# Patient Record
Sex: Female | Born: 1935 | Race: White | Hispanic: No | Marital: Single | Smoking: Former smoker
Health system: Southern US, Community
[De-identification: ages and names within clinical notes are randomized; demographics above are authoritative.]

## PROBLEM LIST (undated history)

## (undated) DIAGNOSIS — Z955 Presence of coronary angioplasty implant and graft: Secondary | ICD-10-CM

## (undated) DIAGNOSIS — I2510 Atherosclerotic heart disease of native coronary artery without angina pectoris: Secondary | ICD-10-CM

## (undated) DIAGNOSIS — Z794 Long term (current) use of insulin: Secondary | ICD-10-CM

## (undated) DIAGNOSIS — I219 Acute myocardial infarction, unspecified: Secondary | ICD-10-CM

## (undated) DIAGNOSIS — E119 Type 2 diabetes mellitus without complications: Secondary | ICD-10-CM

## (undated) DIAGNOSIS — I319 Disease of pericardium, unspecified: Secondary | ICD-10-CM

## (undated) DIAGNOSIS — E785 Hyperlipidemia, unspecified: Secondary | ICD-10-CM

## (undated) DIAGNOSIS — J449 Chronic obstructive pulmonary disease, unspecified: Secondary | ICD-10-CM

## (undated) DIAGNOSIS — R0602 Shortness of breath: Secondary | ICD-10-CM

## (undated) DIAGNOSIS — M1990 Unspecified osteoarthritis, unspecified site: Secondary | ICD-10-CM

## (undated) DIAGNOSIS — I209 Angina pectoris, unspecified: Secondary | ICD-10-CM

## (undated) DIAGNOSIS — I440 Atrioventricular block, first degree: Secondary | ICD-10-CM

## (undated) DIAGNOSIS — I10 Essential (primary) hypertension: Secondary | ICD-10-CM

## (undated) HISTORY — DX: Atrioventricular block, first degree: I44.0

## (undated) HISTORY — DX: Chronic obstructive pulmonary disease, unspecified: J44.9

## (undated) HISTORY — DX: Disease of pericardium, unspecified: I31.9

## (undated) HISTORY — DX: Atherosclerotic heart disease of native coronary artery without angina pectoris: I25.10

## (undated) HISTORY — PX: CHOLECYSTECTOMY: SHX55

---

## 1988-07-18 HISTORY — PX: PTCA: SHX146

## 1999-05-20 ENCOUNTER — Other Ambulatory Visit: Admission: RE | Admit: 1999-05-20 | Discharge: 1999-05-20 | Payer: Self-pay | Admitting: Internal Medicine

## 2002-07-15 ENCOUNTER — Encounter: Payer: Self-pay | Admitting: Internal Medicine

## 2002-07-15 ENCOUNTER — Inpatient Hospital Stay (HOSPITAL_COMMUNITY): Admission: RE | Admit: 2002-07-15 | Discharge: 2002-07-19 | Payer: Self-pay | Admitting: Internal Medicine

## 2002-07-17 ENCOUNTER — Encounter: Payer: Self-pay | Admitting: Internal Medicine

## 2002-08-30 ENCOUNTER — Encounter: Admission: RE | Admit: 2002-08-30 | Discharge: 2002-08-30 | Payer: Self-pay | Admitting: Thoracic Surgery

## 2002-08-30 ENCOUNTER — Encounter: Payer: Self-pay | Admitting: Thoracic Surgery

## 2002-09-26 ENCOUNTER — Encounter: Admission: RE | Admit: 2002-09-26 | Discharge: 2002-09-26 | Payer: Self-pay | Admitting: Thoracic Surgery

## 2002-09-26 ENCOUNTER — Encounter: Payer: Self-pay | Admitting: Thoracic Surgery

## 2002-10-21 ENCOUNTER — Encounter: Payer: Self-pay | Admitting: Internal Medicine

## 2002-10-21 ENCOUNTER — Encounter: Admission: RE | Admit: 2002-10-21 | Discharge: 2002-10-21 | Payer: Self-pay | Admitting: Internal Medicine

## 2002-11-26 ENCOUNTER — Encounter: Payer: Self-pay | Admitting: Internal Medicine

## 2003-05-30 ENCOUNTER — Encounter: Admission: RE | Admit: 2003-05-30 | Discharge: 2003-05-30 | Payer: Self-pay | Admitting: Internal Medicine

## 2004-05-26 ENCOUNTER — Ambulatory Visit: Payer: Self-pay | Admitting: Internal Medicine

## 2004-06-03 ENCOUNTER — Ambulatory Visit: Payer: Self-pay | Admitting: Internal Medicine

## 2004-06-07 ENCOUNTER — Ambulatory Visit: Payer: Self-pay | Admitting: Internal Medicine

## 2004-06-24 ENCOUNTER — Ambulatory Visit: Payer: Self-pay | Admitting: Internal Medicine

## 2004-07-26 ENCOUNTER — Ambulatory Visit: Payer: Self-pay | Admitting: Internal Medicine

## 2004-10-25 ENCOUNTER — Ambulatory Visit: Payer: Self-pay | Admitting: Internal Medicine

## 2004-11-02 ENCOUNTER — Ambulatory Visit: Payer: Self-pay | Admitting: Internal Medicine

## 2004-11-10 ENCOUNTER — Ambulatory Visit: Payer: Self-pay | Admitting: Internal Medicine

## 2004-11-16 ENCOUNTER — Ambulatory Visit: Payer: Self-pay | Admitting: Internal Medicine

## 2004-12-20 ENCOUNTER — Ambulatory Visit: Payer: Self-pay | Admitting: Internal Medicine

## 2005-03-07 ENCOUNTER — Ambulatory Visit: Payer: Self-pay | Admitting: Internal Medicine

## 2005-06-06 ENCOUNTER — Ambulatory Visit: Payer: Self-pay | Admitting: Internal Medicine

## 2005-08-03 ENCOUNTER — Ambulatory Visit: Payer: Self-pay | Admitting: Internal Medicine

## 2005-09-28 ENCOUNTER — Ambulatory Visit: Payer: Self-pay | Admitting: Internal Medicine

## 2005-11-30 ENCOUNTER — Ambulatory Visit: Payer: Self-pay | Admitting: Internal Medicine

## 2006-01-16 ENCOUNTER — Ambulatory Visit: Payer: Self-pay | Admitting: Internal Medicine

## 2006-01-22 ENCOUNTER — Emergency Department (HOSPITAL_COMMUNITY): Admission: AD | Admit: 2006-01-22 | Discharge: 2006-01-22 | Payer: Self-pay | Admitting: Emergency Medicine

## 2006-01-25 ENCOUNTER — Ambulatory Visit: Payer: Self-pay | Admitting: Internal Medicine

## 2006-02-03 ENCOUNTER — Ambulatory Visit: Payer: Self-pay | Admitting: Internal Medicine

## 2006-02-25 ENCOUNTER — Emergency Department (HOSPITAL_COMMUNITY): Admission: EM | Admit: 2006-02-25 | Discharge: 2006-02-25 | Payer: Self-pay | Admitting: Emergency Medicine

## 2006-03-07 ENCOUNTER — Ambulatory Visit: Payer: Self-pay | Admitting: Internal Medicine

## 2006-04-21 ENCOUNTER — Ambulatory Visit: Payer: Self-pay | Admitting: Internal Medicine

## 2006-04-21 DIAGNOSIS — J449 Chronic obstructive pulmonary disease, unspecified: Secondary | ICD-10-CM | POA: Insufficient documentation

## 2006-04-21 DIAGNOSIS — I10 Essential (primary) hypertension: Secondary | ICD-10-CM | POA: Insufficient documentation

## 2006-04-21 DIAGNOSIS — I2510 Atherosclerotic heart disease of native coronary artery without angina pectoris: Secondary | ICD-10-CM | POA: Insufficient documentation

## 2006-04-21 DIAGNOSIS — E1159 Type 2 diabetes mellitus with other circulatory complications: Secondary | ICD-10-CM | POA: Insufficient documentation

## 2006-07-28 ENCOUNTER — Ambulatory Visit: Payer: Self-pay | Admitting: Internal Medicine

## 2006-07-28 LAB — CONVERTED CEMR LAB
ALT: 24 units/L (ref 0–40)
AST: 19 units/L (ref 0–37)
Albumin: 3.7 g/dL (ref 3.5–5.2)
Alkaline Phosphatase: 76 units/L (ref 39–117)
BUN: 19 mg/dL (ref 6–23)
Bilirubin, Direct: 0.1 mg/dL (ref 0.0–0.3)
CO2: 32 meq/L (ref 19–32)
Calcium: 10.1 mg/dL (ref 8.4–10.5)
Chloride: 101 meq/L (ref 96–112)
Chol/HDL Ratio, serum: 9.2
Cholesterol: 238 mg/dL (ref 0–200)
Creatinine, Ser: 0.7 mg/dL (ref 0.4–1.2)
GFR calc non Af Amer: 88 mL/min
Glomerular Filtration Rate, Af Am: 106 mL/min/{1.73_m2}
Glucose, Bld: 124 mg/dL — ABNORMAL HIGH (ref 70–99)
HDL: 26 mg/dL — ABNORMAL LOW (ref >39.0)
Hgb A1c MFr Bld: 6.8 % — ABNORMAL HIGH (ref 4.6–6.0)
LDL DIRECT: 18.6 mg/dL
Potassium: 4 meq/L (ref 3.5–5.1)
Sodium: 139 meq/L (ref 135–145)
Total Bilirubin: 0.8 mg/dL (ref 0.3–1.2)
Total Protein: 6.6 g/dL (ref 6.0–8.3)
Triglyceride fasting, serum: 1693 mg/dL (ref 0–149)

## 2006-08-08 ENCOUNTER — Ambulatory Visit: Payer: Self-pay | Admitting: Internal Medicine

## 2006-08-22 ENCOUNTER — Ambulatory Visit: Payer: Self-pay | Admitting: Internal Medicine

## 2006-08-22 LAB — CONVERTED CEMR LAB
ALT: 20 units/L (ref 0–40)
AST: 20 units/L (ref 0–37)
Albumin: 4.1 g/dL (ref 3.5–5.2)
Alkaline Phosphatase: 57 units/L (ref 39–117)
BUN: 21 mg/dL (ref 6–23)
Bilirubin, Direct: 0.1 mg/dL (ref 0.0–0.3)
CO2: 27 meq/L (ref 19–32)
Calcium: 10.3 mg/dL (ref 8.4–10.5)
Chloride: 106 meq/L (ref 96–112)
Cholesterol: 201 mg/dL (ref 0–200)
Creatinine, Ser: 0.9 mg/dL (ref 0.4–1.2)
Direct LDL: 69 mg/dL
GFR calc Af Amer: 79 mL/min
GFR calc non Af Amer: 66 mL/min
Glucose, Bld: 148 mg/dL — ABNORMAL HIGH (ref 70–99)
HDL: 33.5 mg/dL — ABNORMAL LOW (ref >39.0)
Potassium: 4.7 meq/L (ref 3.5–5.1)
Sodium: 142 meq/L (ref 135–145)
Total Bilirubin: 0.5 mg/dL (ref 0.3–1.2)
Total CHOL/HDL Ratio: 6
Total Protein: 7.5 g/dL (ref 6.0–8.3)
Triglycerides: 708 mg/dL (ref 0–149)
VLDL: 142 mg/dL — ABNORMAL HIGH (ref 0–40)

## 2006-08-29 ENCOUNTER — Ambulatory Visit: Payer: Self-pay | Admitting: Internal Medicine

## 2006-09-19 ENCOUNTER — Ambulatory Visit: Payer: Self-pay | Admitting: Internal Medicine

## 2006-09-26 ENCOUNTER — Encounter: Admission: RE | Admit: 2006-09-26 | Discharge: 2006-09-26 | Payer: Self-pay | Admitting: Internal Medicine

## 2006-09-26 IMAGING — CR DG CHEST 2V
2 series · 2 of 2 positions shown · non-contrast
Comparison: [DATE]

CLINICAL DATA: Cough, wheeze

CHEST - 2 VIEW:

[view not recorded (1 of 2)]
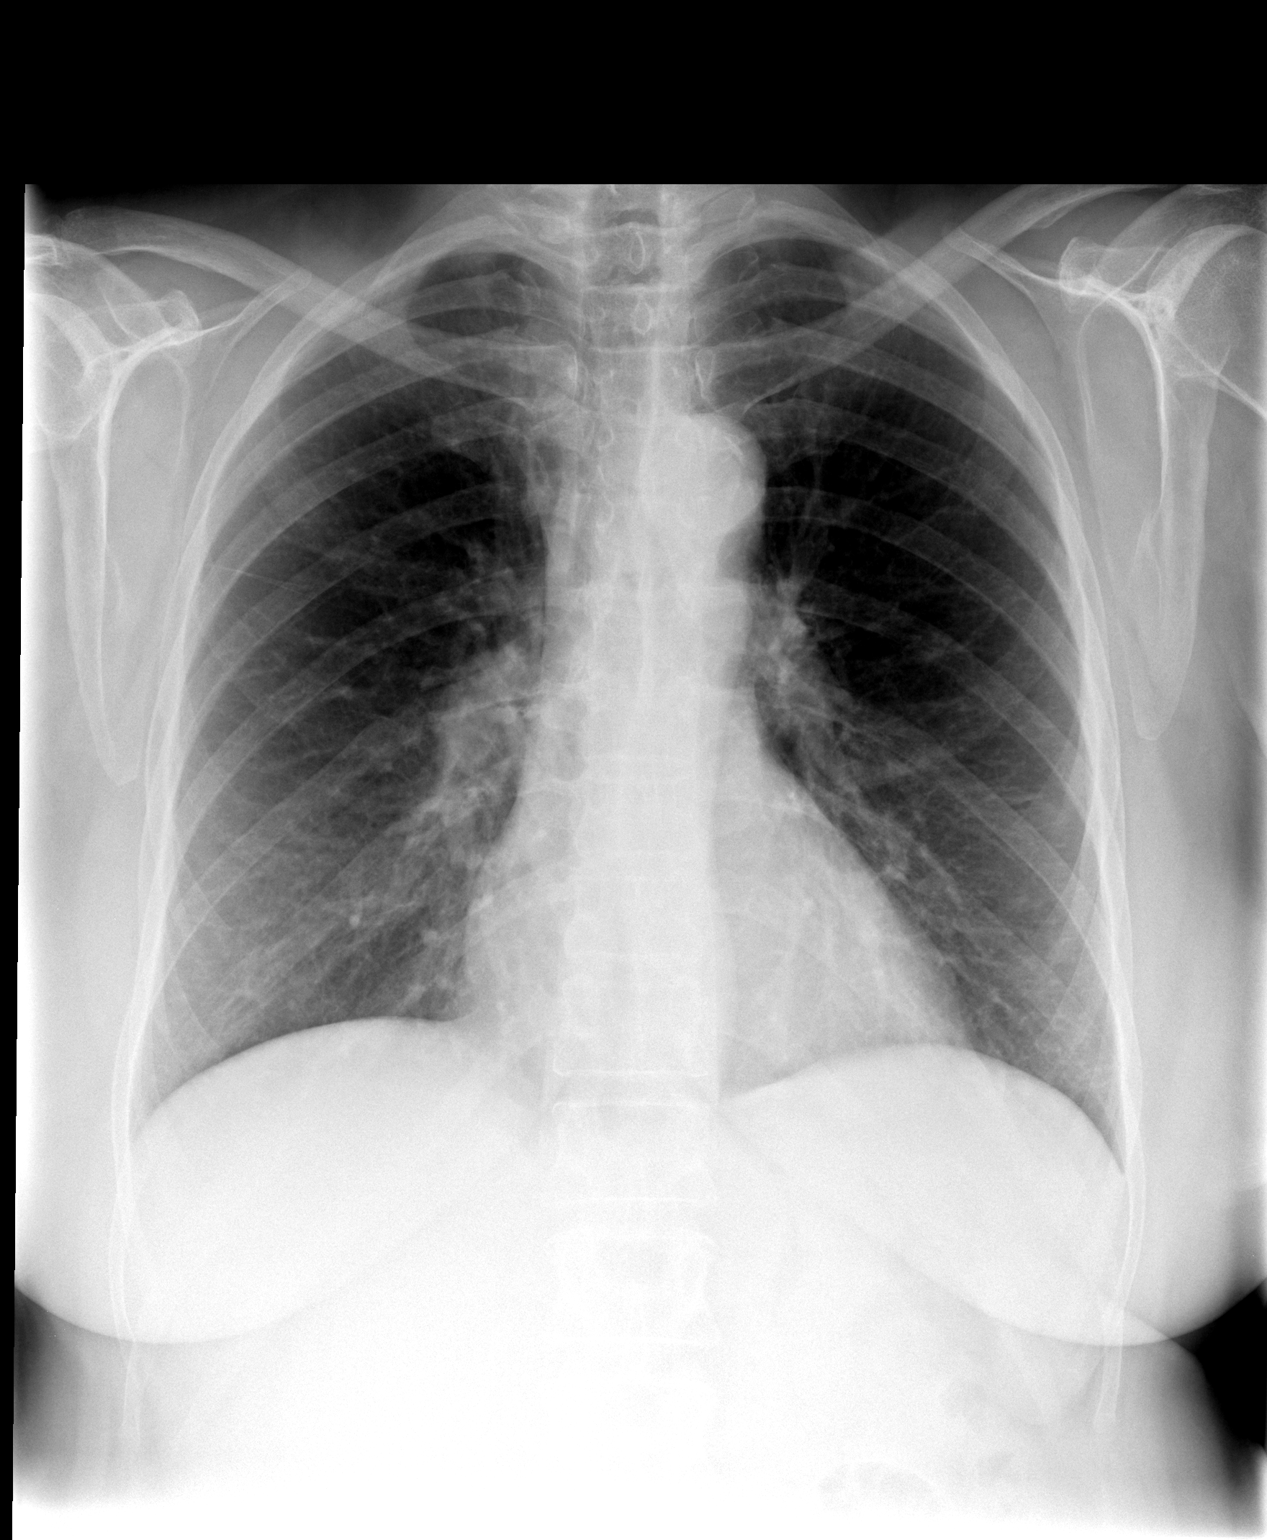

[view not recorded (2 of 2)]
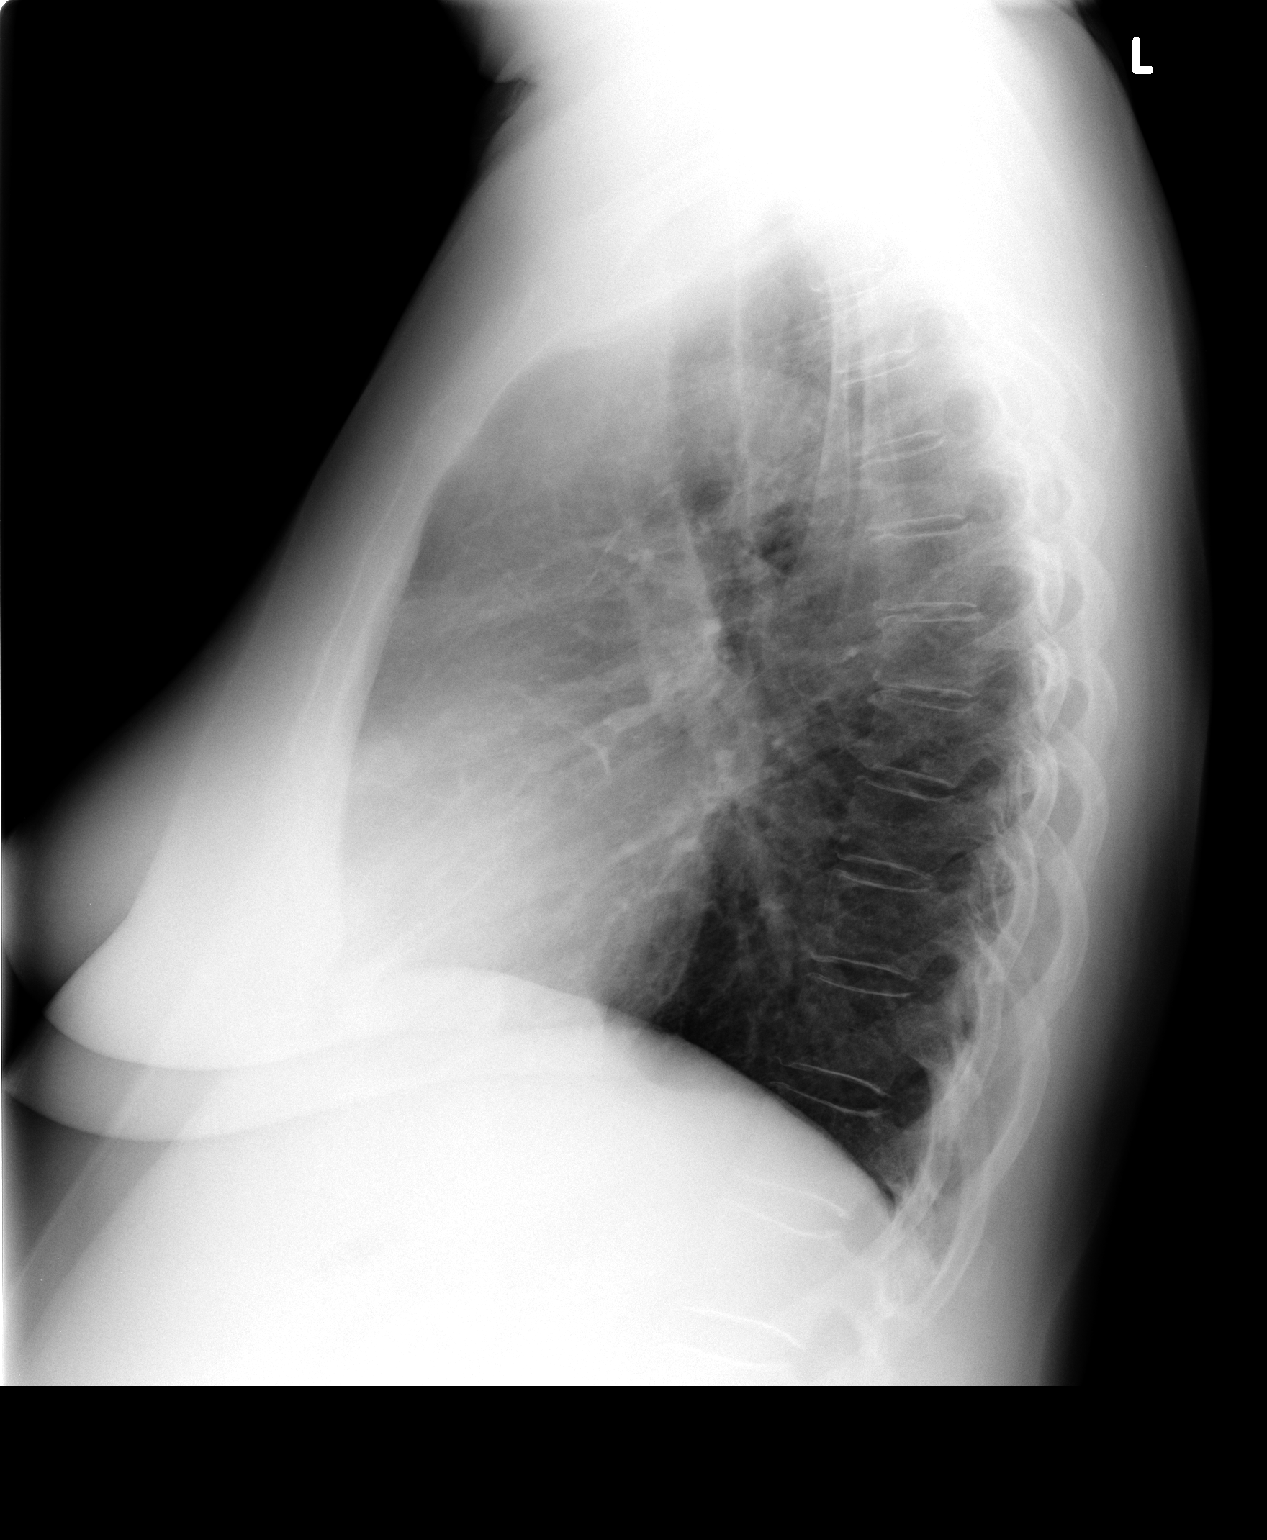

[2 of 2 positions shown; findings below may reference images not displayed]

FINDINGS: The heart size and mediastinal contours are within normal limits. 
Both lungs are clear.  The visualized skeletal structures are unremarkable. Mild
peribronchial thickening.
IMPRESSION: No active cardiopulmonary disease

Mild chronic bronchitic changes.

## 2006-10-25 ENCOUNTER — Ambulatory Visit: Payer: Self-pay | Admitting: Internal Medicine

## 2006-12-05 ENCOUNTER — Ambulatory Visit: Payer: Self-pay | Admitting: Internal Medicine

## 2006-12-05 LAB — CONVERTED CEMR LAB
ALT: 27 units/L (ref 0–40)
AST: 28 units/L (ref 0–37)
Albumin: 3.9 g/dL (ref 3.5–5.2)
Alkaline Phosphatase: 61 units/L (ref 39–117)
BUN: 23 mg/dL (ref 6–23)
Bilirubin, Direct: 0.1 mg/dL (ref 0.0–0.3)
CO2: 31 meq/L (ref 19–32)
Calcium: 10.1 mg/dL (ref 8.4–10.5)
Chloride: 102 meq/L (ref 96–112)
Creatinine, Ser: 0.8 mg/dL (ref 0.4–1.2)
GFR calc Af Amer: 91 mL/min
GFR calc non Af Amer: 75 mL/min
Glucose, Bld: 224 mg/dL — ABNORMAL HIGH (ref 70–99)
Hgb A1c MFr Bld: 8.4 % — ABNORMAL HIGH (ref 4.6–6.0)
Iron: 126 ug/dL (ref 42–145)
Potassium: 4.2 meq/L (ref 3.5–5.1)
Saturation Ratios: 23.8 % (ref 20.0–50.0)
Sodium: 140 meq/L (ref 135–145)
Total Bilirubin: 0.9 mg/dL (ref 0.3–1.2)
Total Protein: 6.7 g/dL (ref 6.0–8.3)
Transferrin: 378.5 mg/dL — ABNORMAL HIGH (ref >=212.0)

## 2006-12-05 IMAGING — CR DG KNEE COMPLETE 4+V*L*
4 series · 4 of 4 positions shown · non-contrast
Comparison: none

CLINICAL DATA: Arthritis. Left knee pain. 
LEFT KNEE - 4 VIEW:

[view not recorded (1 of 4)]
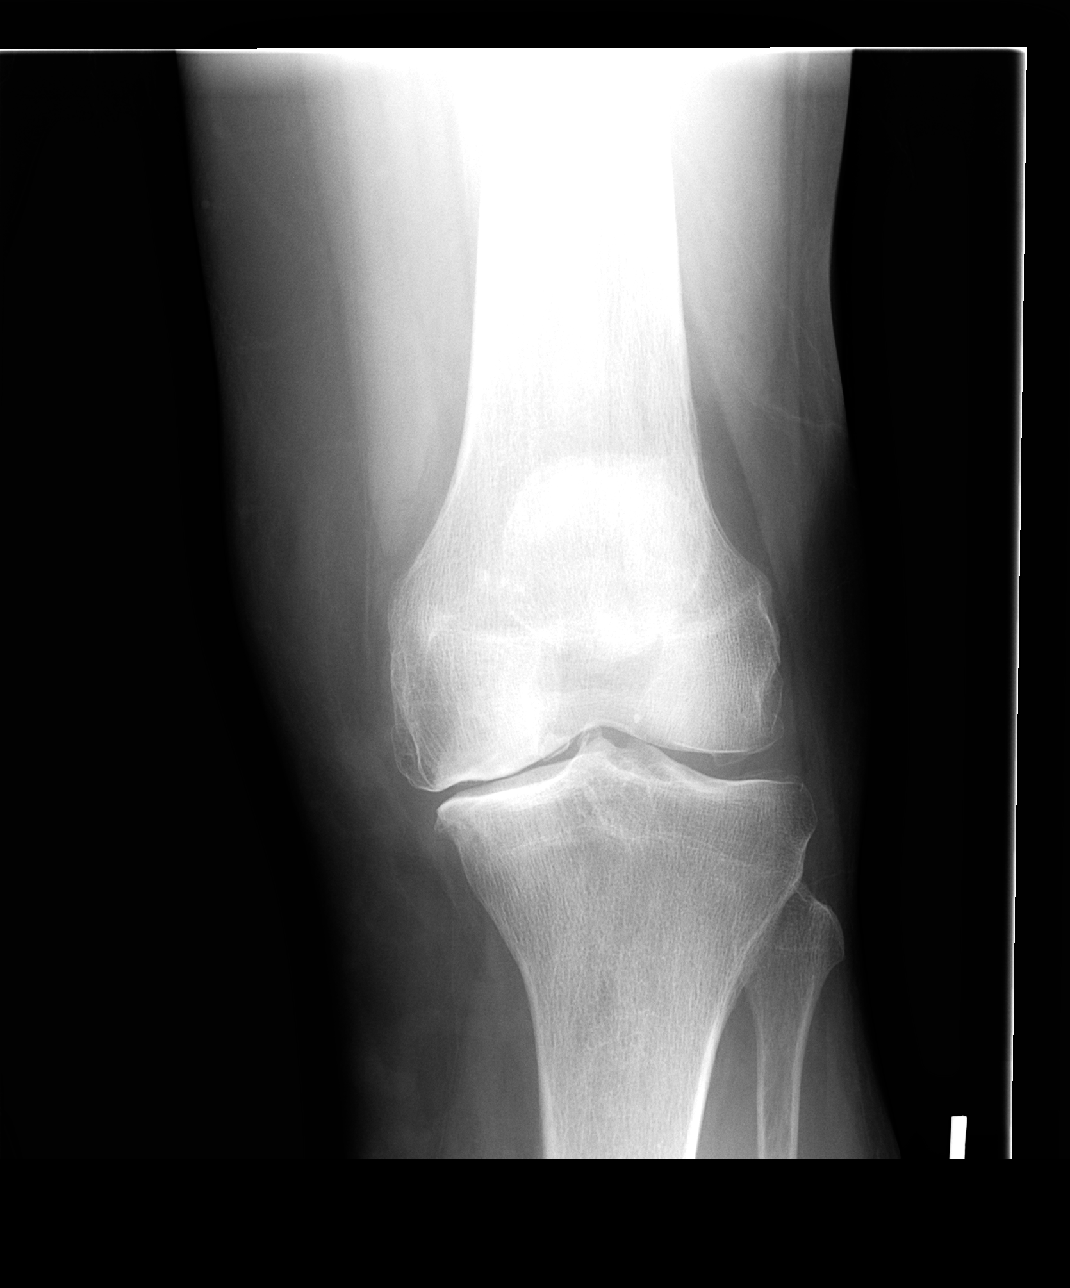

[view not recorded (2 of 4)]
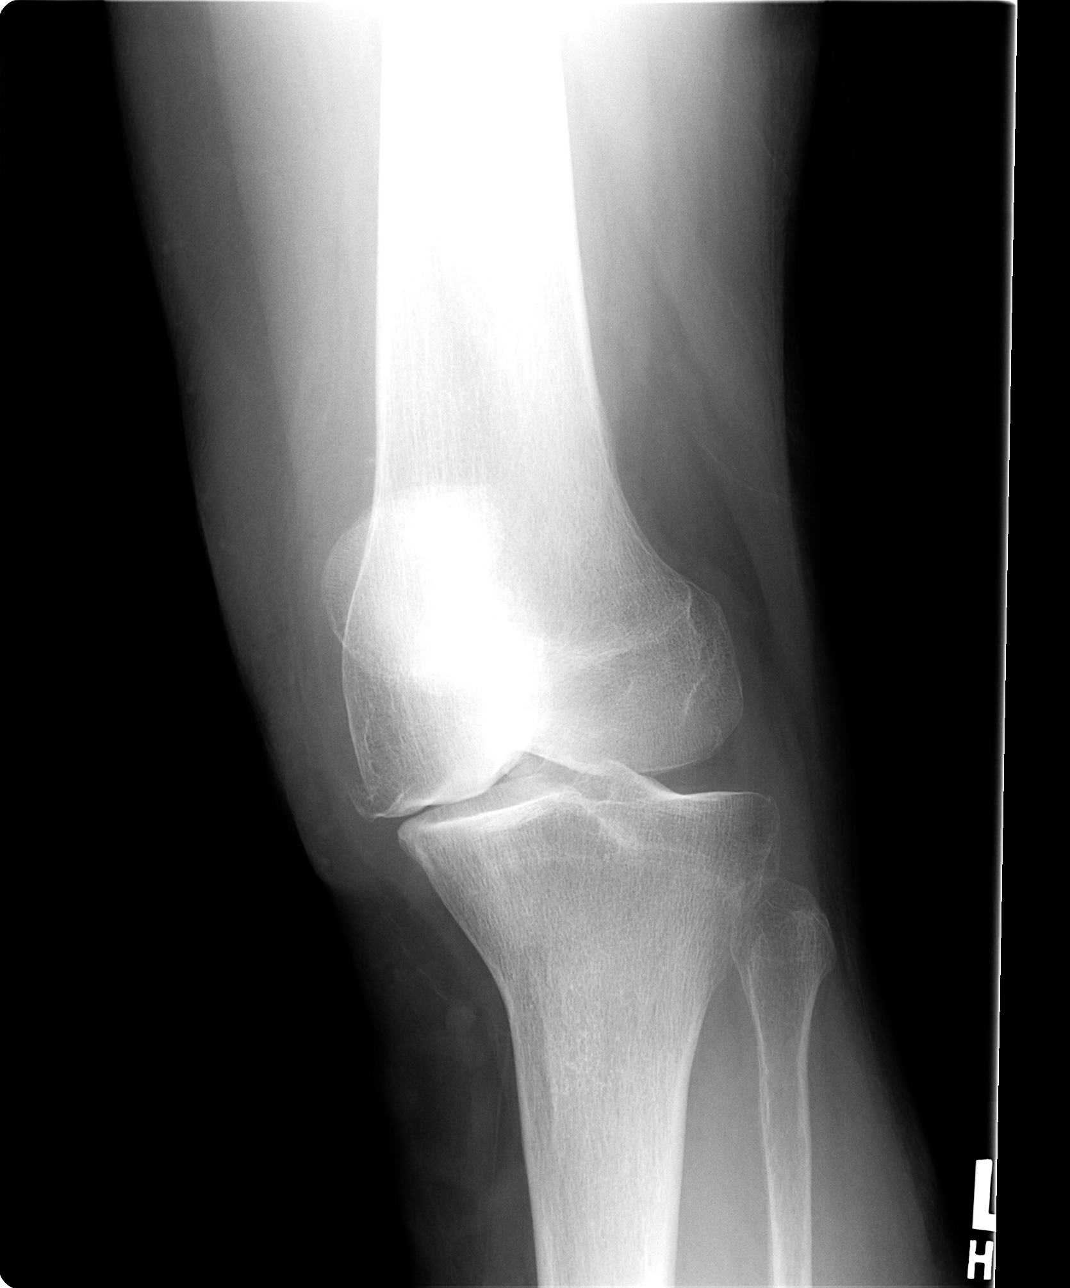

[view not recorded (3 of 4)]
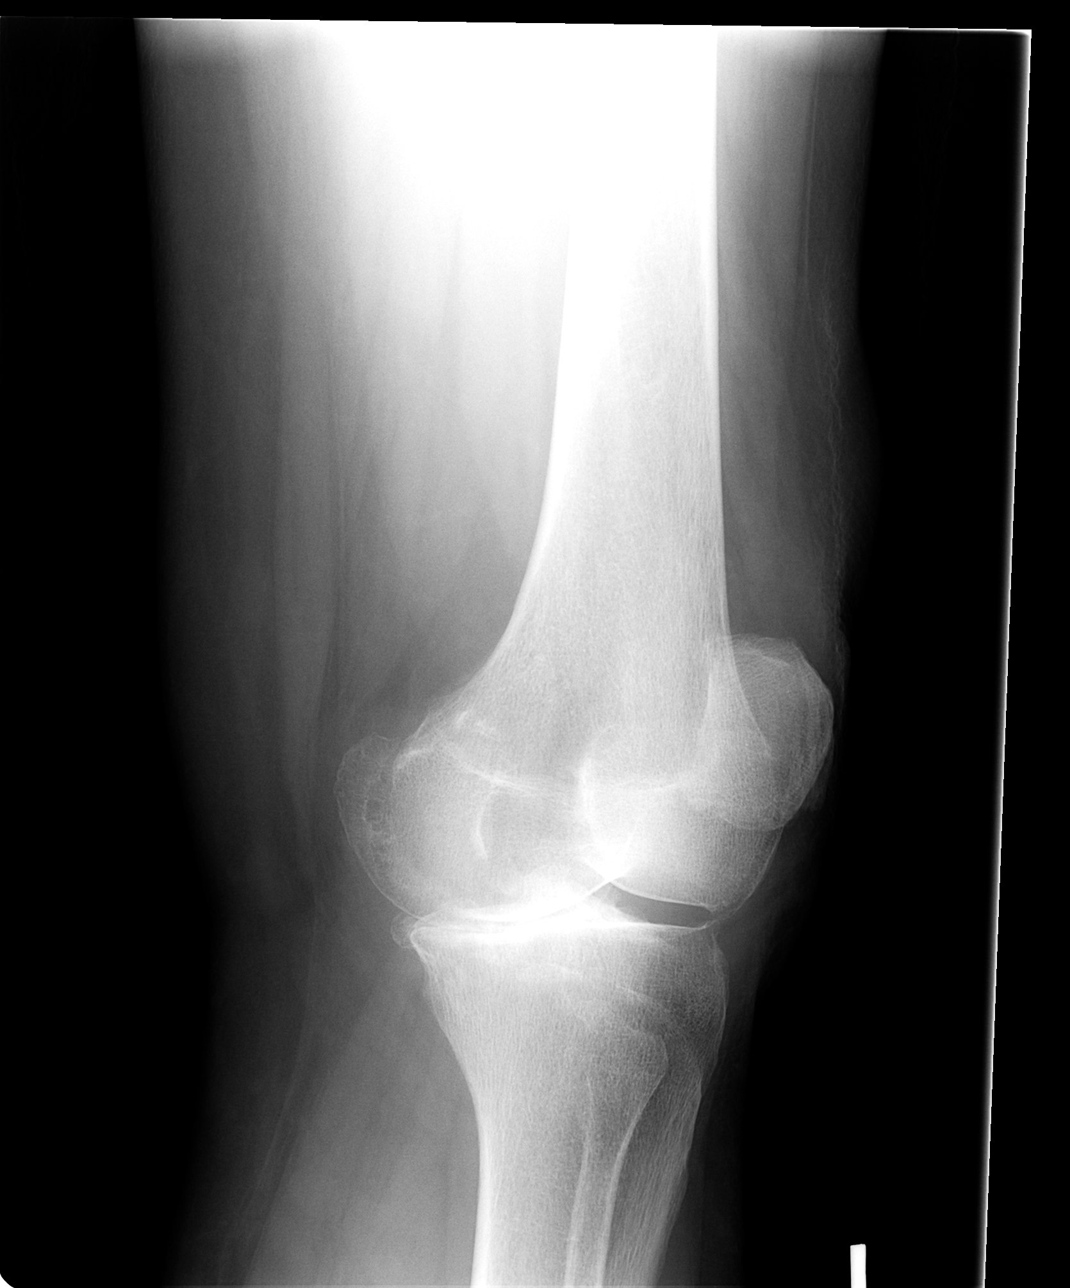

[view not recorded (4 of 4)]
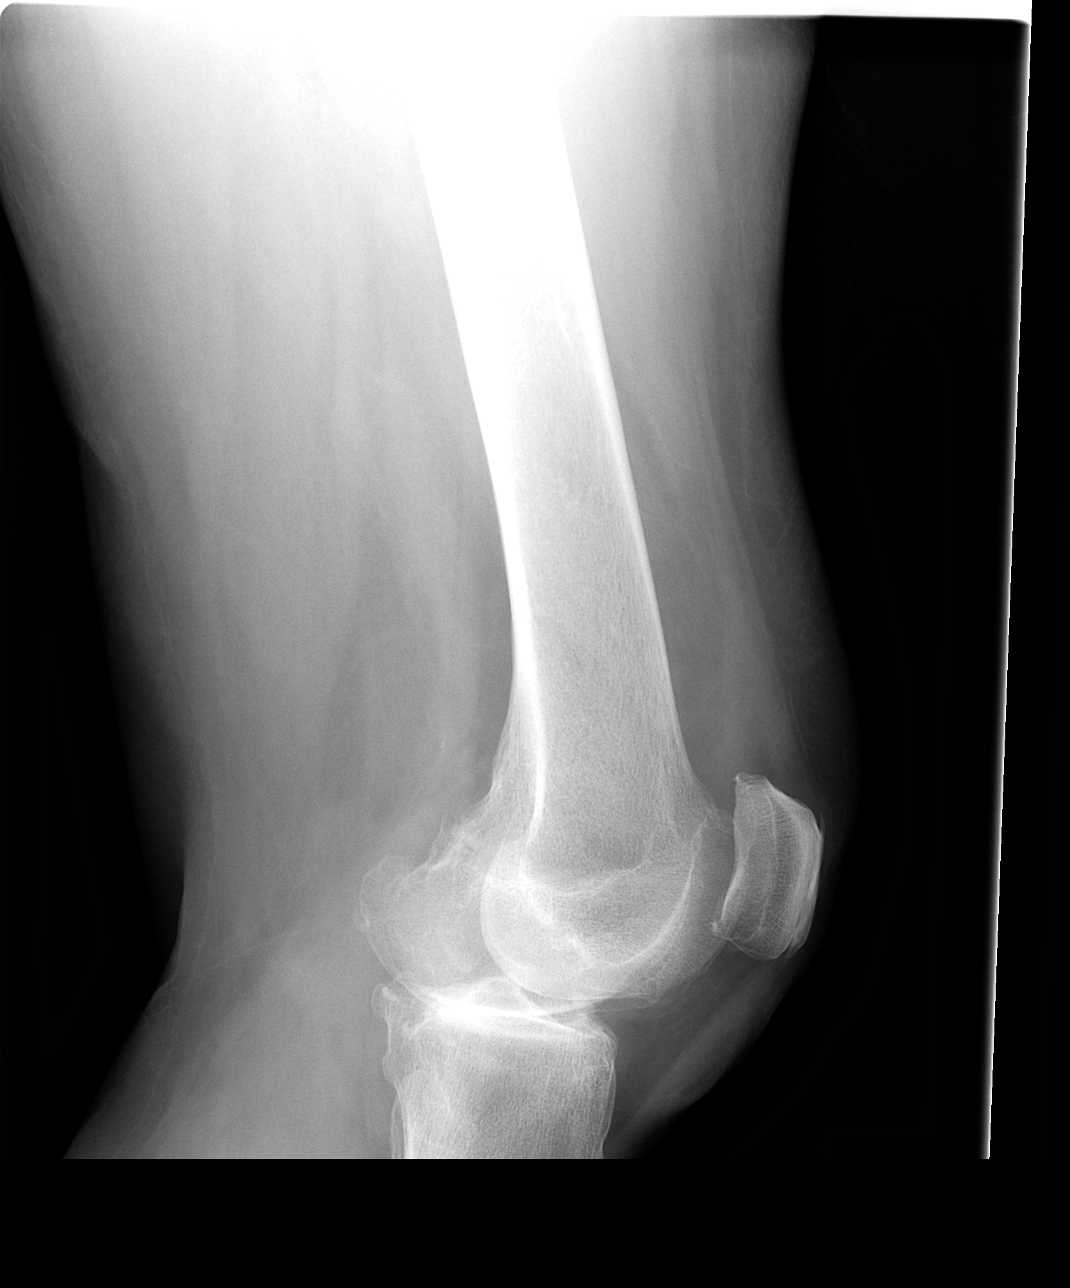

[4 of 4 positions shown; findings below may reference images not displayed]

FINDINGS: Osteophytic lipping and subarticular sclerosis involving the medial femoral condyle and plateau. There is some narrowing of the medial compartment joint space.  Slight osteophytic lipping of articular margins of the patella.  Probable minimal knee joint effusion. Slight lateral tibiofemoral subluxation. Findings compatible with loose body in the anterior aspect of the femoral tibial joint space.  I believe the loose body measures approximately 6 x 8 mm.
IMPRESSION: Degenerative OA changes primarily involve the medial femoral tibial compartment. Slight lateral tibiofemoral subluxation. Intraarticular loose body. 
RIGHT KNEE - 4 VIEW:
FINDINGS: Osteophytic lipping articular margins distal femur, proximal tibia and patella.  Degenerative changes mainly involve the medial femoral tibial compartment. There is joint space narrowing.  Probable small knee joint effusion. Moderate suspicion for bony loose body.  Degenerative changes.  Slight lateral subluxation of the tibia on the femur.
IMPRESSION: Degenerative OA changes. Small knee joint effusion suspicious for loose body.

## 2006-12-05 IMAGING — CR DG KNEE COMPLETE 4+V*R*
4 series · 4 of 4 positions shown · non-contrast
Comparison: none

CLINICAL DATA: Arthritis. Left knee pain. 
LEFT KNEE - 4 VIEW:

[view not recorded (1 of 4)]
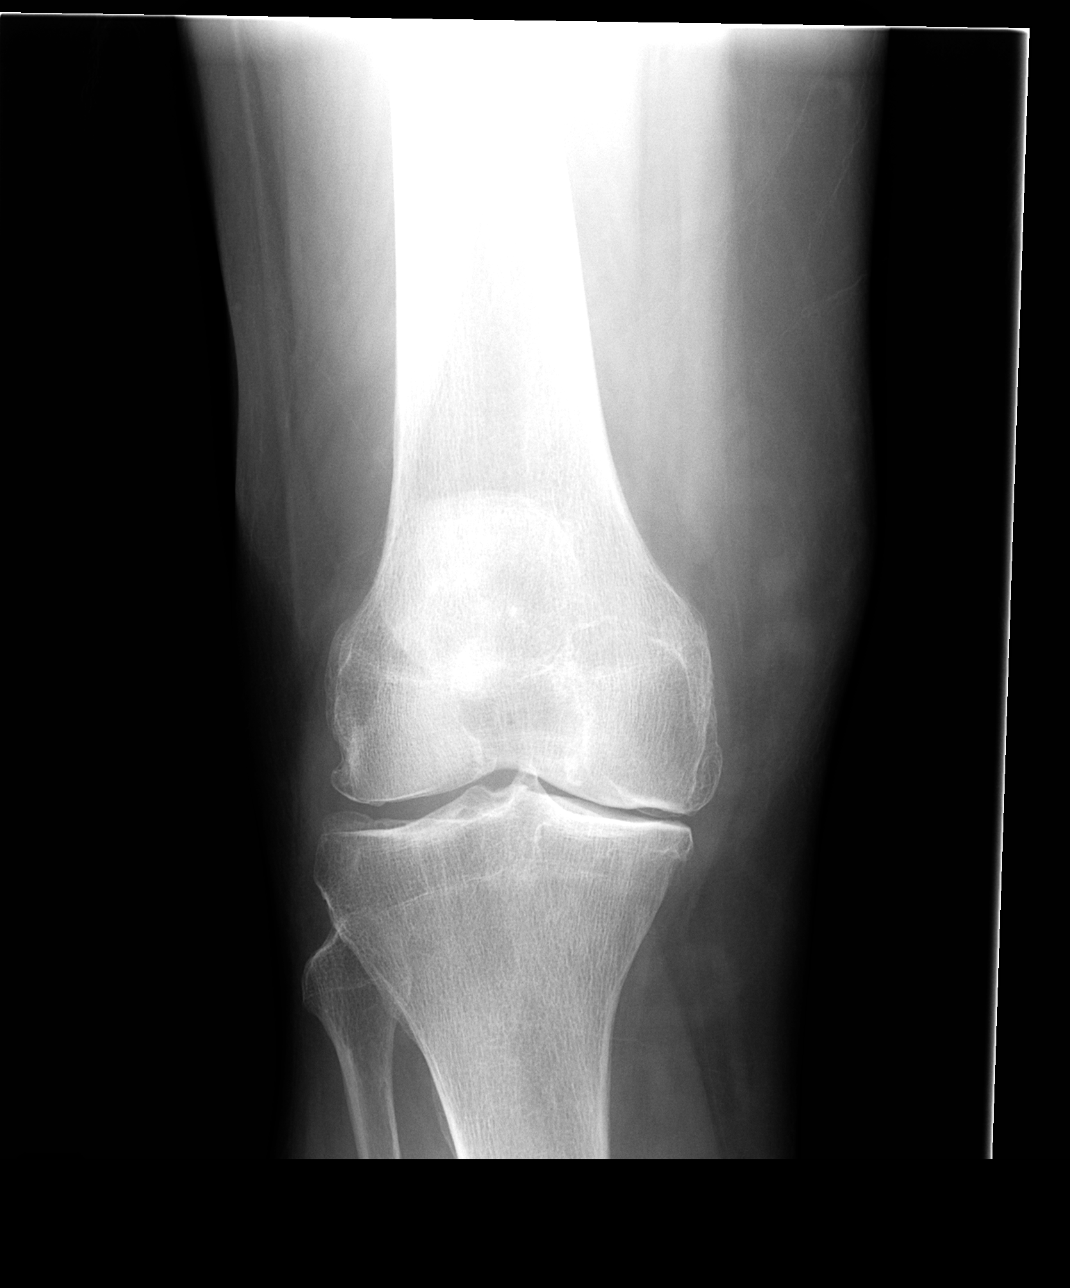

[view not recorded (2 of 4)]
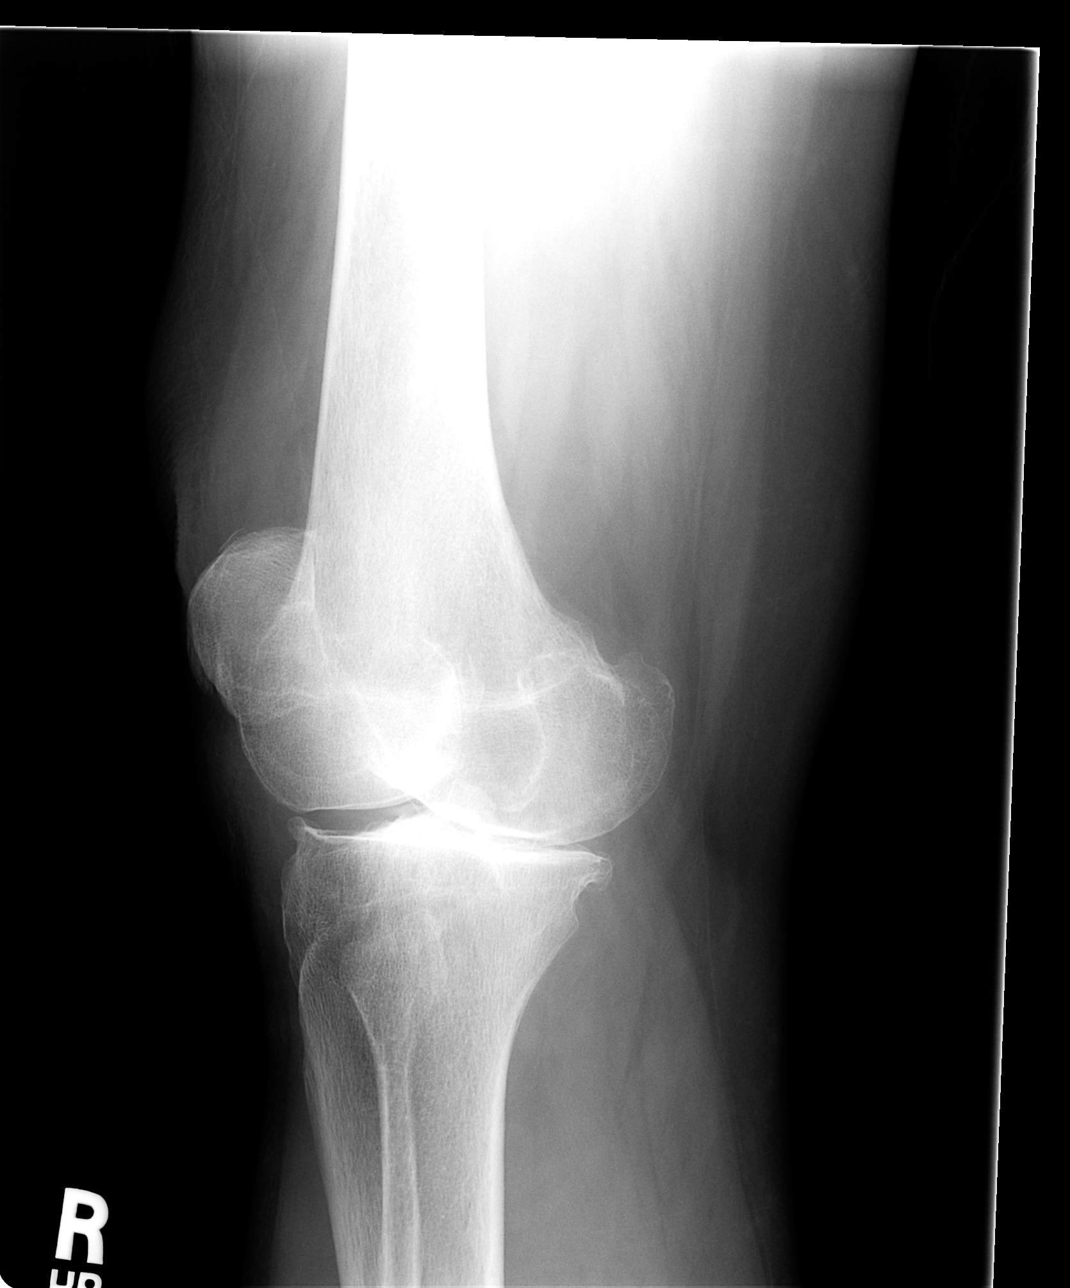

[view not recorded (3 of 4)]
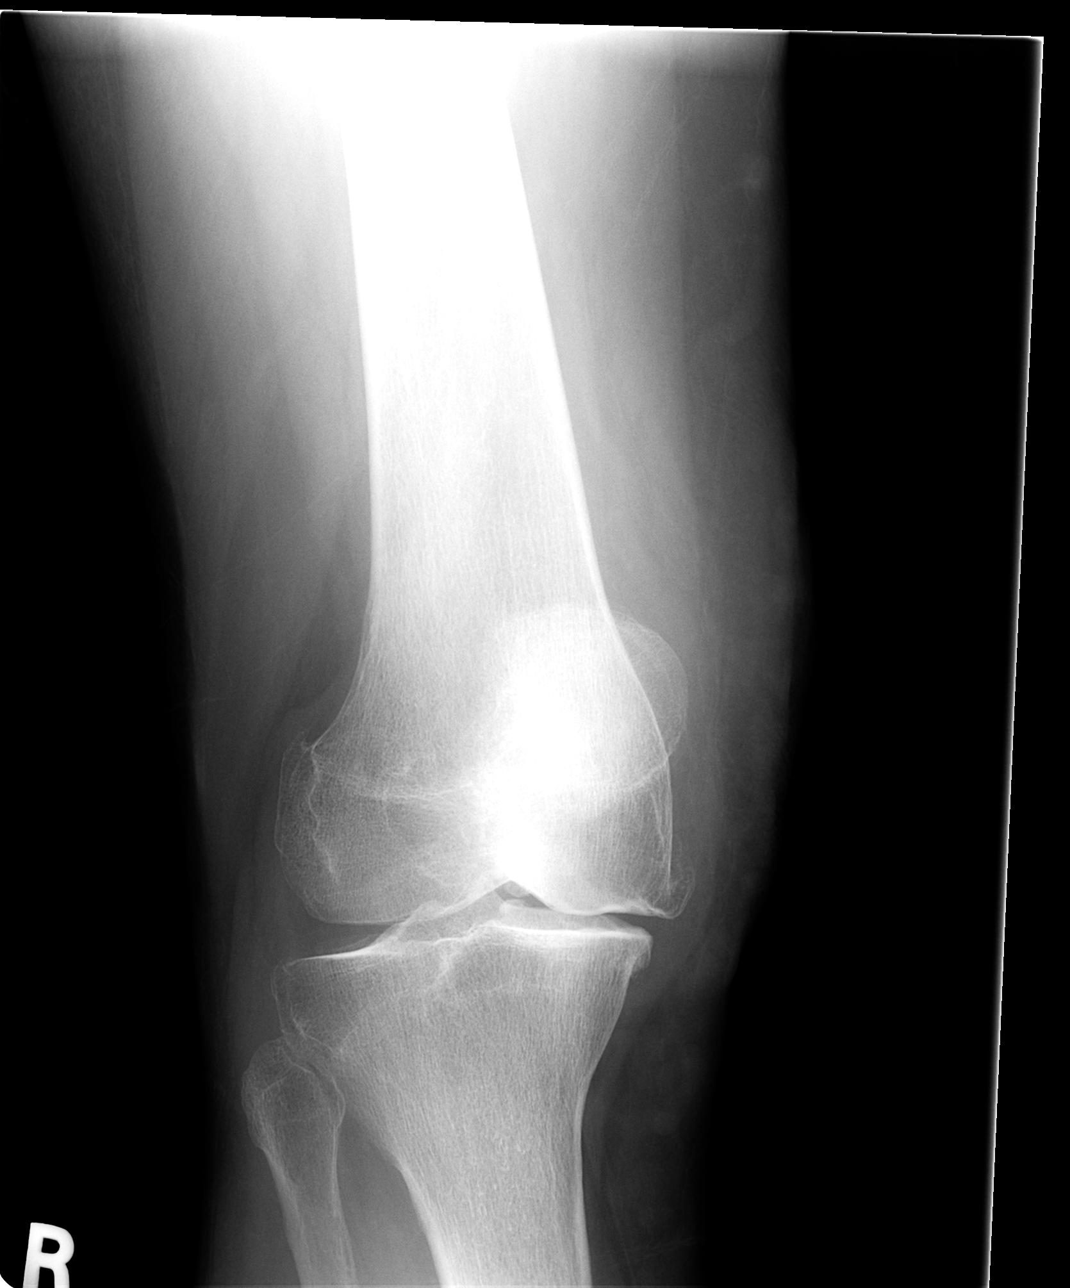

[view not recorded (4 of 4)]
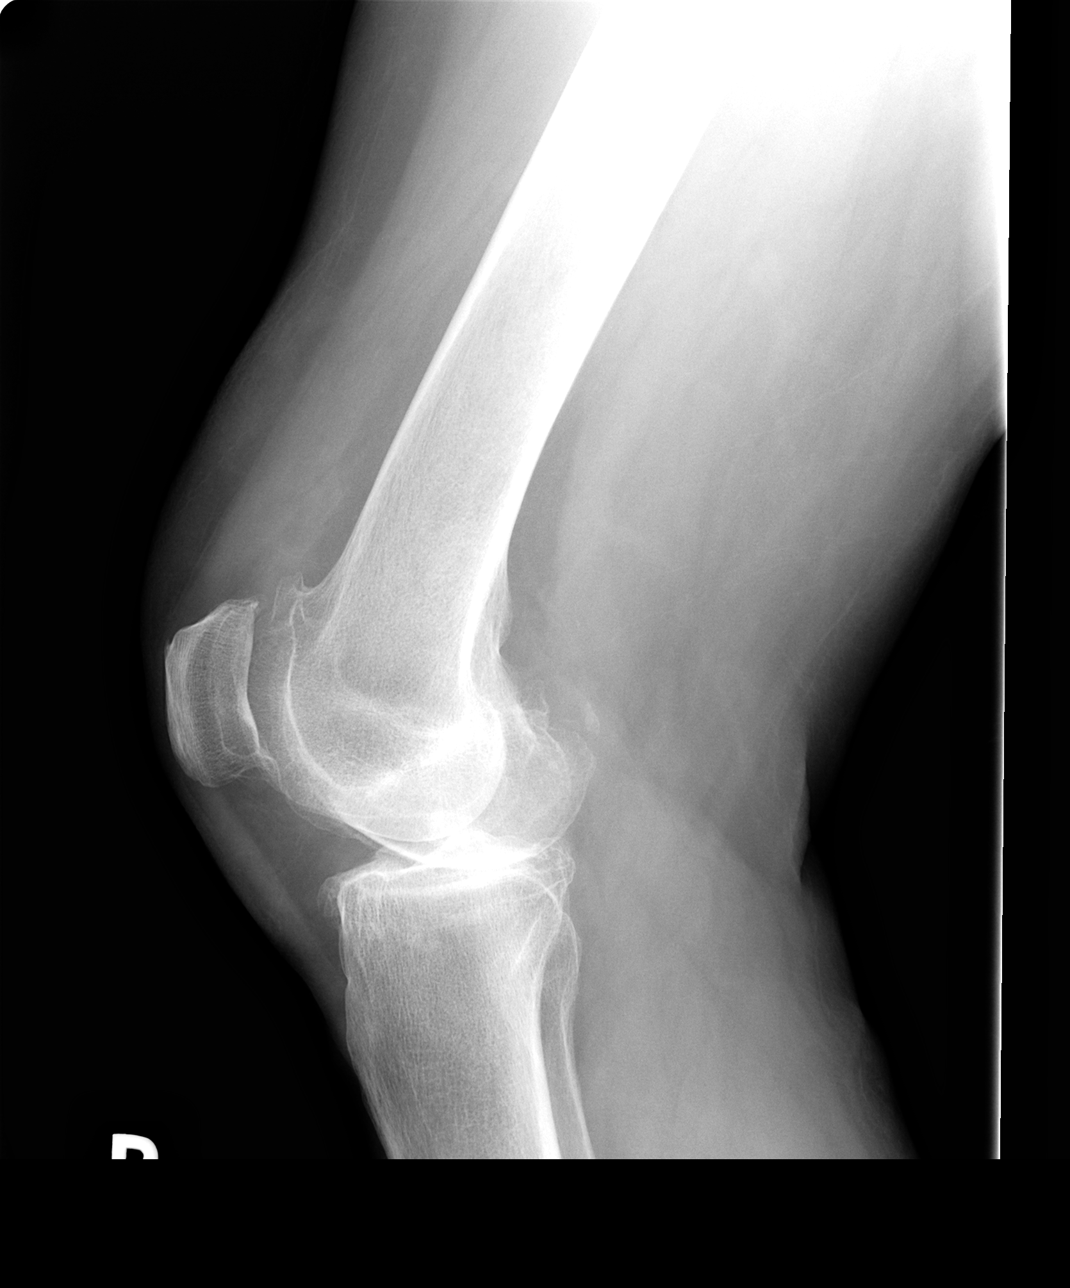

[4 of 4 positions shown; findings below may reference images not displayed]

FINDINGS: Osteophytic lipping and subarticular sclerosis involving the medial femoral condyle and plateau. There is some narrowing of the medial compartment joint space.  Slight osteophytic lipping of articular margins of the patella.  Probable minimal knee joint effusion. Slight lateral tibiofemoral subluxation. Findings compatible with loose body in the anterior aspect of the femoral tibial joint space.  I believe the loose body measures approximately 6 x 8 mm.
IMPRESSION: Degenerative OA changes primarily involve the medial femoral tibial compartment. Slight lateral tibiofemoral subluxation. Intraarticular loose body. 
RIGHT KNEE - 4 VIEW:
FINDINGS: Osteophytic lipping articular margins distal femur, proximal tibia and patella.  Degenerative changes mainly involve the medial femoral tibial compartment. There is joint space narrowing.  Probable small knee joint effusion. Moderate suspicion for bony loose body.  Degenerative changes.  Slight lateral subluxation of the tibia on the femur.
IMPRESSION: Degenerative OA changes. Small knee joint effusion suspicious for loose body.

## 2006-12-08 ENCOUNTER — Ambulatory Visit: Payer: Self-pay

## 2006-12-10 IMAGING — CR DG CHEST 2V
2 series · 2 of 2 positions shown · non-contrast
Comparison: [DATE]

CLINICAL DATA: Shortness of breath, cough, asthma

CHEST - 2 VIEW:

[view not recorded (1 of 2)]
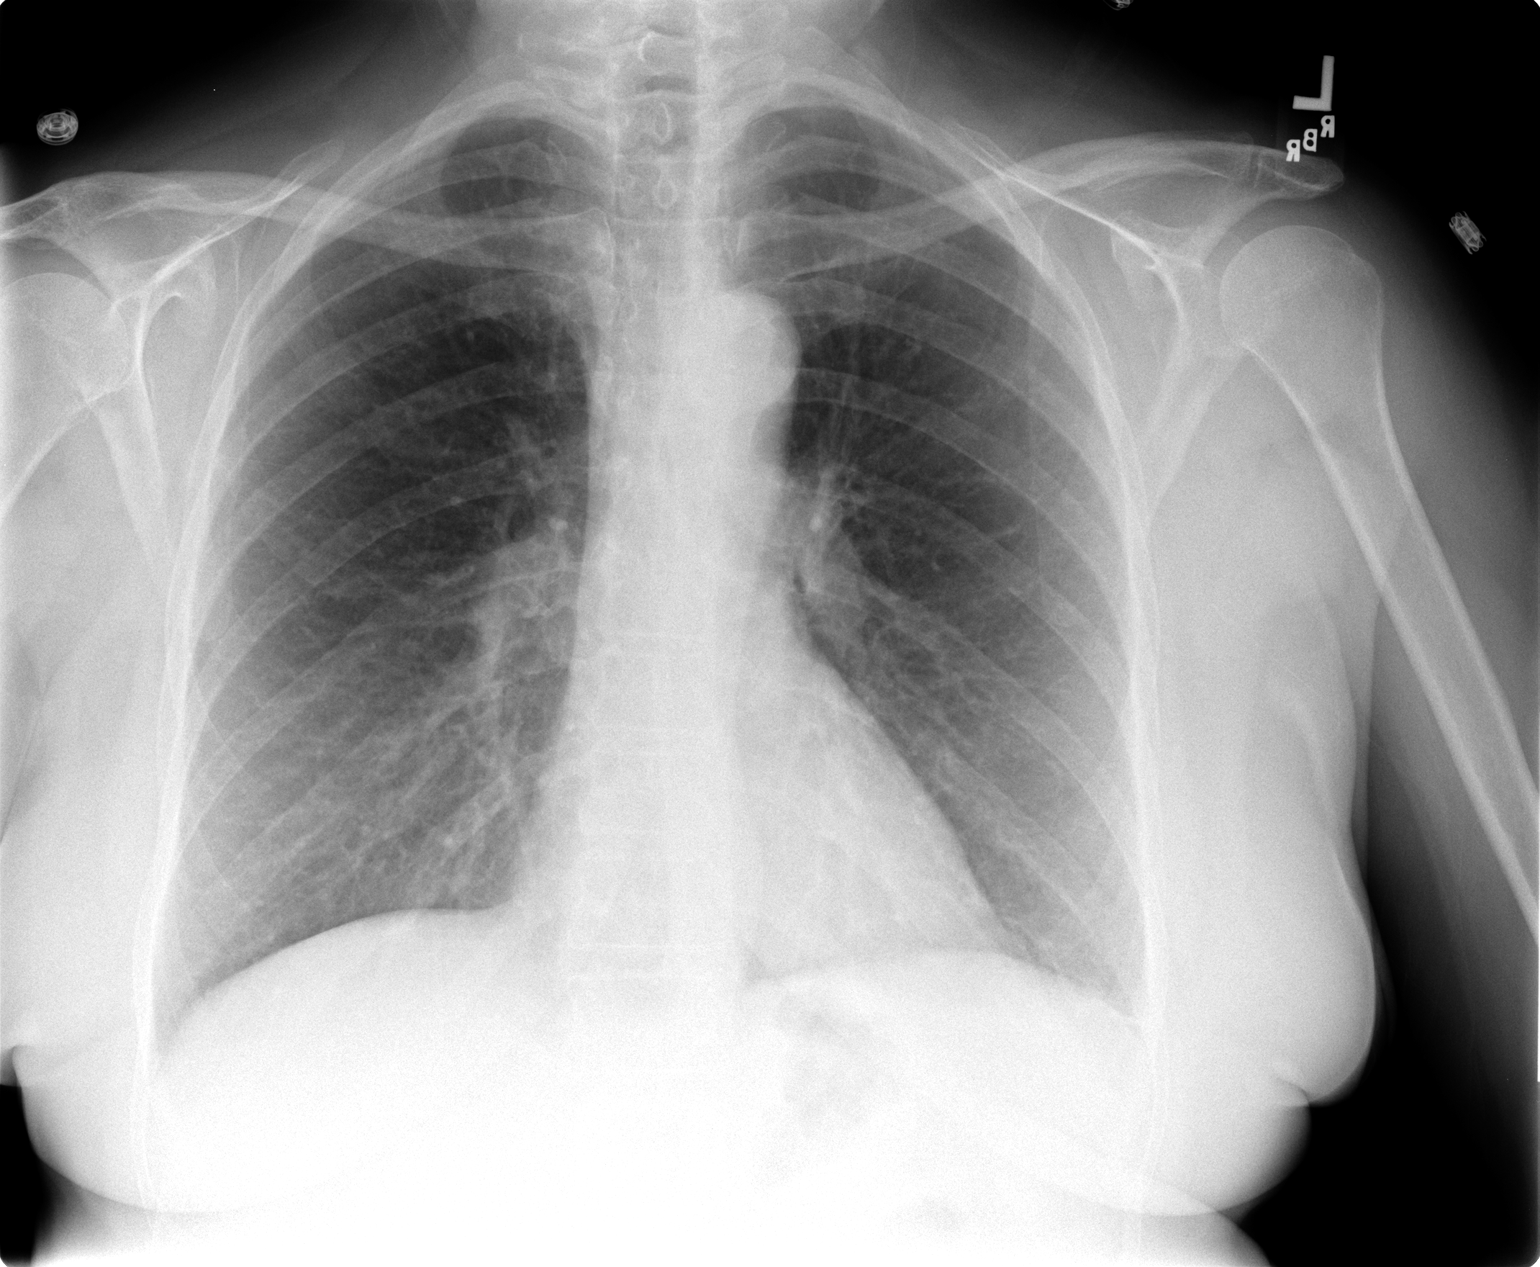

[view not recorded (2 of 2)]
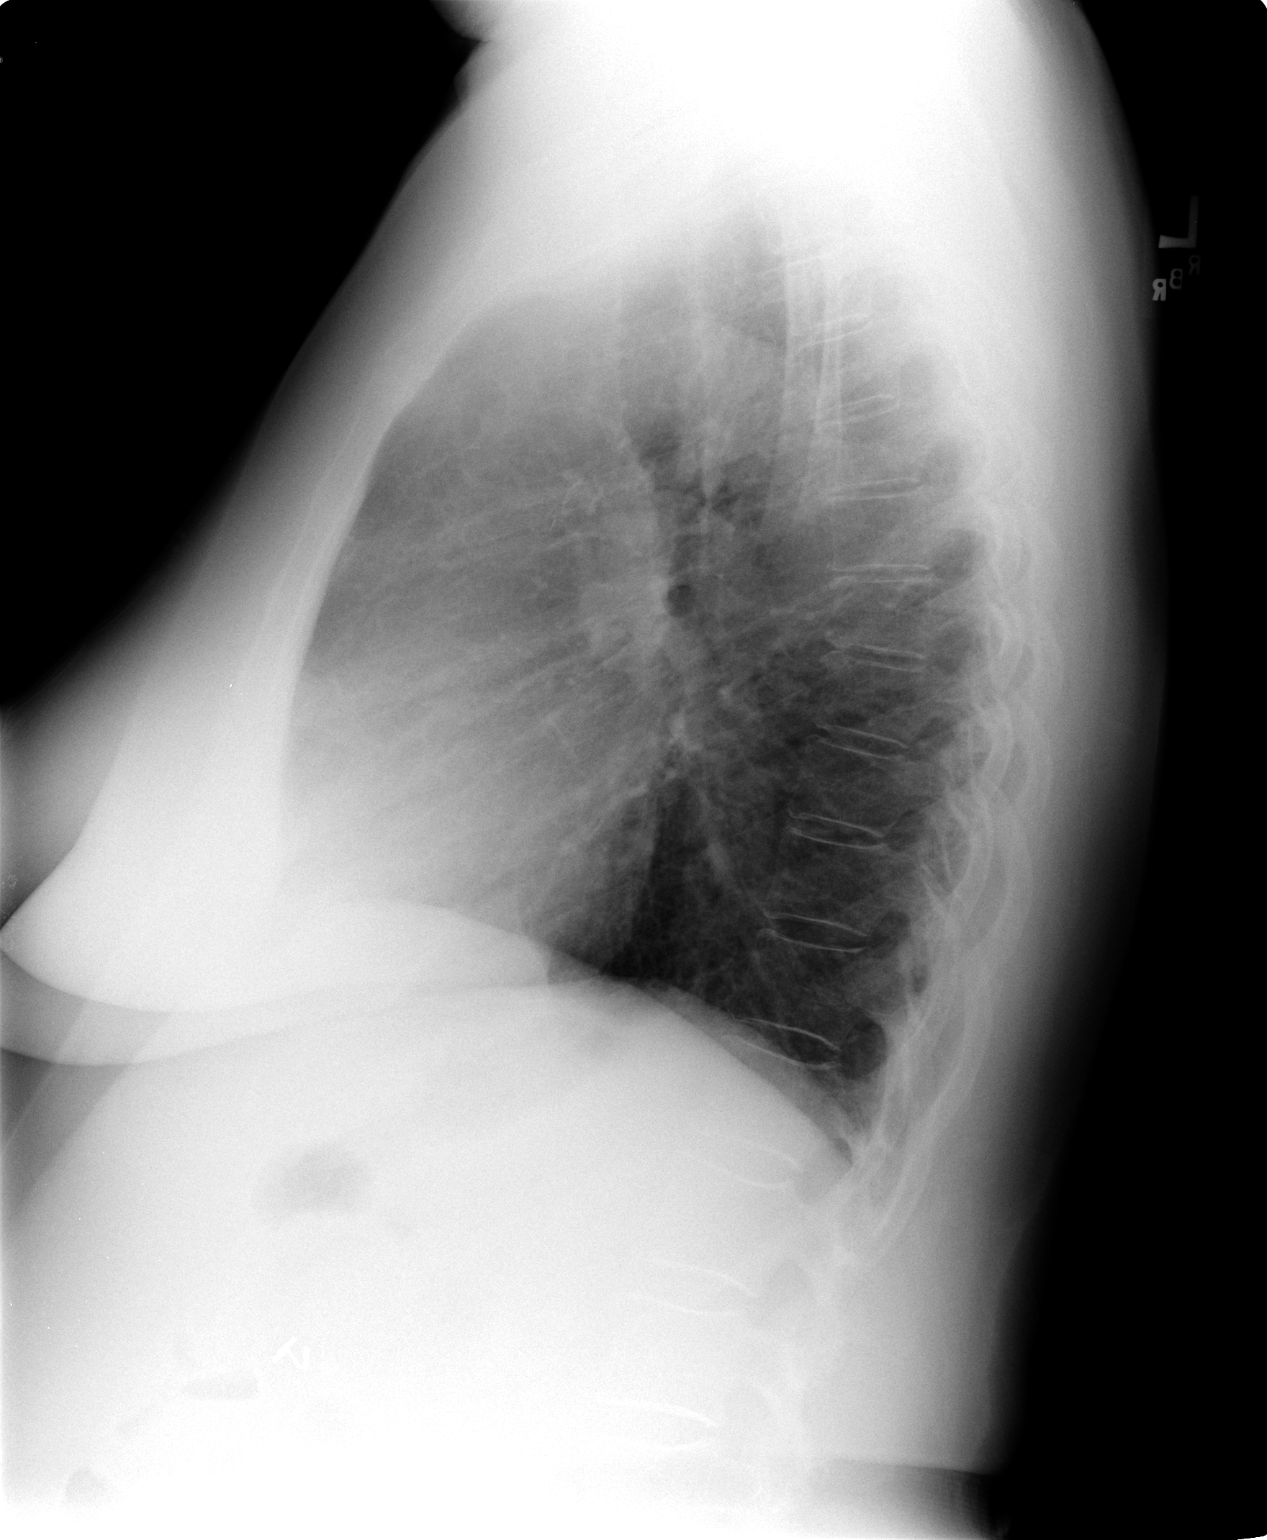

[2 of 2 positions shown; findings below may reference images not displayed]

FINDINGS: The heart size and mediastinal contours are within normal limits. 
Mild peribronchial thickening noted. No focal opacities. The visualized skeletal
structures are unremarkable.
IMPRESSION: Mild bronchitic changes.

## 2006-12-18 ENCOUNTER — Ambulatory Visit: Payer: Self-pay | Admitting: Internal Medicine

## 2007-02-20 ENCOUNTER — Telehealth (INDEPENDENT_AMBULATORY_CARE_PROVIDER_SITE_OTHER): Payer: Self-pay | Admitting: *Deleted

## 2007-03-26 ENCOUNTER — Ambulatory Visit: Payer: Self-pay | Admitting: Internal Medicine

## 2007-03-26 DIAGNOSIS — E1169 Type 2 diabetes mellitus with other specified complication: Secondary | ICD-10-CM | POA: Insufficient documentation

## 2007-03-26 DIAGNOSIS — E119 Type 2 diabetes mellitus without complications: Secondary | ICD-10-CM | POA: Insufficient documentation

## 2007-03-26 DIAGNOSIS — E785 Hyperlipidemia, unspecified: Secondary | ICD-10-CM

## 2007-03-26 DIAGNOSIS — Z794 Long term (current) use of insulin: Secondary | ICD-10-CM | POA: Insufficient documentation

## 2007-03-28 LAB — CONVERTED CEMR LAB
ALT: 23 units/L (ref 0–35)
AST: 20 units/L (ref 0–37)
Albumin: 3.9 g/dL (ref 3.5–5.2)
Alkaline Phosphatase: 48 units/L (ref 39–117)
BUN: 22 mg/dL (ref 6–23)
Basophils Absolute: 0 10*3/uL (ref 0.0–0.1)
Basophils Relative: 0.6 % (ref 0.0–1.0)
Bilirubin, Direct: 0.1 mg/dL (ref 0.0–0.3)
CO2: 32 meq/L (ref 19–32)
Calcium: 10 mg/dL (ref 8.4–10.5)
Chloride: 102 meq/L (ref 96–112)
Cholesterol: 202 mg/dL (ref 0–200)
Creatinine, Ser: 0.8 mg/dL (ref 0.4–1.2)
Direct LDL: 50 mg/dL
Eosinophils Absolute: 0.1 10*3/uL (ref 0.0–0.6)
Eosinophils Relative: 2.1 % (ref 0.0–5.0)
GFR calc Af Amer: 91 mL/min
GFR calc non Af Amer: 75 mL/min
Glucose, Bld: 94 mg/dL (ref 70–99)
HCT: 38.1 % (ref 36.0–46.0)
HDL: 31 mg/dL — ABNORMAL LOW (ref >39.0)
Hemoglobin: 13.5 g/dL (ref 12.0–15.0)
Hgb A1c MFr Bld: 6 % (ref 4.6–6.0)
Lymphocytes Relative: 36.2 % (ref 12.0–46.0)
MCHC: 35.4 g/dL (ref 30.0–36.0)
MCV: 88 fL (ref 78.0–100.0)
Monocytes Absolute: 0.8 10*3/uL — ABNORMAL HIGH (ref 0.2–0.7)
Monocytes Relative: 10.9 % (ref 3.0–11.0)
Neutro Abs: 3.6 10*3/uL (ref 1.4–7.7)
Neutrophils Relative %: 50.2 % (ref 43.0–77.0)
Platelets: 264 10*3/uL (ref 150–400)
Potassium: 4.5 meq/L (ref 3.5–5.1)
RBC: 4.33 M/uL (ref 3.87–5.11)
RDW: 12.8 % (ref 11.5–14.6)
Sodium: 141 meq/L (ref 135–145)
Total Bilirubin: 0.7 mg/dL (ref 0.3–1.2)
Total CHOL/HDL Ratio: 6.5
Total Protein: 6.8 g/dL (ref 6.0–8.3)
Triglycerides: 1124 mg/dL (ref 0–149)
WBC: 7 10*3/uL (ref 4.5–10.5)

## 2007-04-16 ENCOUNTER — Ambulatory Visit: Payer: Self-pay | Admitting: Internal Medicine

## 2007-04-18 ENCOUNTER — Telehealth: Payer: Self-pay | Admitting: *Deleted

## 2007-04-18 LAB — CONVERTED CEMR LAB
Cholesterol: 190 mg/dL (ref 0–200)
Direct LDL: 57.7 mg/dL
HDL: 34.9 mg/dL — ABNORMAL LOW (ref >39.0)
Total CHOL/HDL Ratio: 5.4
Triglycerides: 713 mg/dL (ref 0–149)
VLDL: 143 mg/dL — ABNORMAL HIGH (ref 0–40)

## 2007-04-25 ENCOUNTER — Telehealth (INDEPENDENT_AMBULATORY_CARE_PROVIDER_SITE_OTHER): Payer: Self-pay | Admitting: *Deleted

## 2007-05-24 ENCOUNTER — Ambulatory Visit: Payer: Self-pay | Admitting: Internal Medicine

## 2007-06-29 ENCOUNTER — Telehealth: Payer: Self-pay | Admitting: Internal Medicine

## 2007-07-16 ENCOUNTER — Ambulatory Visit: Payer: Self-pay | Admitting: Internal Medicine

## 2007-07-16 LAB — CONVERTED CEMR LAB
ALT: 22 units/L (ref 0–35)
AST: 22 units/L (ref 0–37)
Albumin: 3.9 g/dL (ref 3.5–5.2)
Alkaline Phosphatase: 46 units/L (ref 39–117)
Bilirubin, Direct: 0.1 mg/dL (ref 0.0–0.3)
Cholesterol: 198 mg/dL (ref 0–200)
Direct LDL: 48.6 mg/dL
HDL: 33.5 mg/dL — ABNORMAL LOW (ref >39.0)
Total Bilirubin: 0.5 mg/dL (ref 0.3–1.2)
Total CHOL/HDL Ratio: 5.9
Total Protein: 7.2 g/dL (ref 6.0–8.3)
Triglycerides: 817 mg/dL (ref 0–149)
VLDL: 163 mg/dL — ABNORMAL HIGH (ref 0–40)

## 2007-07-23 ENCOUNTER — Ambulatory Visit: Payer: Self-pay | Admitting: Internal Medicine

## 2007-08-10 ENCOUNTER — Ambulatory Visit: Payer: Self-pay | Admitting: Internal Medicine

## 2007-08-17 ENCOUNTER — Ambulatory Visit: Payer: Self-pay | Admitting: Internal Medicine

## 2007-09-14 ENCOUNTER — Ambulatory Visit: Payer: Self-pay | Admitting: Internal Medicine

## 2007-09-14 LAB — CONVERTED CEMR LAB
AST: 21 units/L (ref 0–37)
Albumin: 3.9 g/dL (ref 3.5–5.2)
Alkaline Phosphatase: 49 units/L (ref 39–117)
BUN: 22 mg/dL (ref 6–23)
CO2: 30 meq/L (ref 19–32)
Chloride: 100 meq/L (ref 96–112)
Cholesterol: 219 mg/dL (ref 0–200)
Creatinine, Ser: 0.9 mg/dL (ref 0.4–1.2)
Sodium: 139 meq/L (ref 135–145)
Total Bilirubin: 0.8 mg/dL (ref 0.3–1.2)
Total Protein: 6.8 g/dL (ref 6.0–8.3)
VLDL: 169 mg/dL — ABNORMAL HIGH (ref 0–40)

## 2007-09-21 ENCOUNTER — Ambulatory Visit: Payer: Self-pay | Admitting: Internal Medicine

## 2007-10-19 ENCOUNTER — Ambulatory Visit: Payer: Self-pay | Admitting: Internal Medicine

## 2007-10-19 LAB — CONVERTED CEMR LAB
Bilirubin, Direct: 0.1 mg/dL (ref 0.0–0.3)
HDL: 36.2 mg/dL — ABNORMAL LOW (ref >39.0)
Total Bilirubin: 0.6 mg/dL (ref 0.3–1.2)
VLDL: 131 mg/dL — ABNORMAL HIGH (ref 0–40)

## 2007-10-29 ENCOUNTER — Ambulatory Visit: Payer: Self-pay | Admitting: Internal Medicine

## 2007-11-06 ENCOUNTER — Telehealth: Payer: Self-pay | Admitting: Internal Medicine

## 2007-12-21 ENCOUNTER — Telehealth: Payer: Self-pay | Admitting: Family Medicine

## 2008-01-14 ENCOUNTER — Ambulatory Visit: Payer: Self-pay | Admitting: Internal Medicine

## 2008-01-14 LAB — CONVERTED CEMR LAB
ALT: 23 units/L (ref 0–35)
Calcium: 9.7 mg/dL (ref 8.4–10.5)
Cholesterol: 229 mg/dL (ref 0–200)
Creatinine, Ser: 0.8 mg/dL (ref 0.4–1.2)
Direct LDL: 62.1 mg/dL
GFR calc Af Amer: 90 mL/min
GFR calc non Af Amer: 75 mL/min
HDL: 39.6 mg/dL (ref >39.0)
Hgb A1c MFr Bld: 6.5 % — ABNORMAL HIGH (ref 4.6–6.0)
Total Bilirubin: 0.6 mg/dL (ref 0.3–1.2)
Total CHOL/HDL Ratio: 5.8
Triglycerides: 893 mg/dL (ref 0–149)
VLDL: 179 mg/dL — ABNORMAL HIGH (ref 0–40)

## 2008-01-28 ENCOUNTER — Ambulatory Visit: Payer: Self-pay | Admitting: Internal Medicine

## 2008-04-04 ENCOUNTER — Telehealth: Payer: Self-pay | Admitting: Internal Medicine

## 2008-05-19 ENCOUNTER — Ambulatory Visit: Payer: Self-pay | Admitting: Internal Medicine

## 2008-05-19 LAB — CONVERTED CEMR LAB
Albumin: 4.1 g/dL (ref 3.5–5.2)
Alkaline Phosphatase: 62 units/L (ref 39–117)
BUN: 18 mg/dL (ref 6–23)
GFR calc Af Amer: 90 mL/min
Glucose, Bld: 190 mg/dL — ABNORMAL HIGH (ref 70–99)
HDL: 37.7 mg/dL — ABNORMAL LOW (ref >39.0)
Potassium: 4 meq/L (ref 3.5–5.1)
Total Protein: 7.4 g/dL (ref 6.0–8.3)
Triglycerides: 577 mg/dL (ref 0–149)

## 2008-05-26 ENCOUNTER — Ambulatory Visit: Payer: Self-pay | Admitting: Internal Medicine

## 2008-06-30 ENCOUNTER — Ambulatory Visit: Payer: Self-pay | Admitting: Internal Medicine

## 2008-07-02 ENCOUNTER — Telehealth: Payer: Self-pay | Admitting: Internal Medicine

## 2008-07-18 HISTORY — PX: CATARACT EXTRACTION: SUR2

## 2008-07-28 ENCOUNTER — Ambulatory Visit: Payer: Self-pay | Admitting: Internal Medicine

## 2008-09-22 ENCOUNTER — Ambulatory Visit: Payer: Self-pay | Admitting: Internal Medicine

## 2008-09-22 LAB — CONVERTED CEMR LAB
ALT: 26 units/L (ref 0–35)
AST: 26 units/L (ref 0–37)
Alkaline Phosphatase: 51 units/L (ref 39–117)
Bilirubin, Direct: 0.1 mg/dL (ref 0.0–0.3)
CO2: 28 meq/L (ref 19–32)
Chloride: 102 meq/L (ref 96–112)
Cholesterol: 234 mg/dL (ref 0–200)
Potassium: 3.5 meq/L (ref 3.5–5.1)
Sodium: 140 meq/L (ref 135–145)
Total Bilirubin: 0.8 mg/dL (ref 0.3–1.2)
Total CHOL/HDL Ratio: 6.1

## 2008-09-29 ENCOUNTER — Ambulatory Visit: Payer: Self-pay | Admitting: Internal Medicine

## 2008-09-30 ENCOUNTER — Telehealth: Payer: Self-pay | Admitting: Internal Medicine

## 2009-01-26 ENCOUNTER — Ambulatory Visit: Payer: Self-pay | Admitting: Internal Medicine

## 2009-01-26 LAB — CONVERTED CEMR LAB
BUN: 25 mg/dL — ABNORMAL HIGH (ref 6–23)
Bilirubin, Direct: 0 mg/dL (ref 0.0–0.3)
Chloride: 104 meq/L (ref 96–112)
Glucose, Bld: 190 mg/dL — ABNORMAL HIGH (ref 70–99)
HDL: 40.8 mg/dL (ref >39.00)
Hgb A1c MFr Bld: 7.2 % — ABNORMAL HIGH (ref 4.6–6.5)
Potassium: 4.5 meq/L (ref 3.5–5.1)
Sodium: 142 meq/L (ref 135–145)
Total Bilirubin: 0.6 mg/dL (ref 0.3–1.2)
VLDL: 151.6 mg/dL — ABNORMAL HIGH (ref 0.0–40.0)

## 2009-02-09 ENCOUNTER — Ambulatory Visit: Payer: Self-pay | Admitting: Internal Medicine

## 2009-02-10 ENCOUNTER — Telehealth: Payer: Self-pay | Admitting: Internal Medicine

## 2009-02-11 ENCOUNTER — Telehealth: Payer: Self-pay | Admitting: Internal Medicine

## 2009-02-24 ENCOUNTER — Telehealth: Payer: Self-pay | Admitting: Internal Medicine

## 2009-04-02 ENCOUNTER — Encounter: Payer: Self-pay | Admitting: Internal Medicine

## 2009-05-01 ENCOUNTER — Ambulatory Visit: Payer: Self-pay | Admitting: Internal Medicine

## 2009-06-01 ENCOUNTER — Ambulatory Visit: Payer: Self-pay | Admitting: Internal Medicine

## 2009-06-01 LAB — CONVERTED CEMR LAB
ALT: 22 units/L (ref 0–35)
Albumin: 4.2 g/dL (ref 3.5–5.2)
Calcium: 9.8 mg/dL (ref 8.4–10.5)
Cholesterol: 196 mg/dL (ref 0–200)
Direct LDL: 63.8 mg/dL
GFR calc non Af Amer: 64.98 mL/min (ref >60)
Glucose, Bld: 190 mg/dL — ABNORMAL HIGH (ref 70–99)
HDL: 36.2 mg/dL — ABNORMAL LOW (ref >39.00)
Potassium: 4.8 meq/L (ref 3.5–5.1)
Sodium: 139 meq/L (ref 135–145)
Total Bilirubin: 0.9 mg/dL (ref 0.3–1.2)
Total CHOL/HDL Ratio: 5
Triglycerides: 723 mg/dL — ABNORMAL HIGH (ref 0.0–149.0)

## 2009-06-08 ENCOUNTER — Ambulatory Visit: Payer: Self-pay | Admitting: Internal Medicine

## 2009-06-17 ENCOUNTER — Telehealth: Payer: Self-pay | Admitting: Internal Medicine

## 2009-06-23 ENCOUNTER — Telehealth: Payer: Self-pay | Admitting: Internal Medicine

## 2009-07-15 ENCOUNTER — Telehealth: Payer: Self-pay | Admitting: Internal Medicine

## 2009-08-03 ENCOUNTER — Telehealth: Payer: Self-pay | Admitting: Internal Medicine

## 2009-09-08 ENCOUNTER — Telehealth: Payer: Self-pay | Admitting: Internal Medicine

## 2009-10-09 ENCOUNTER — Ambulatory Visit: Payer: Self-pay | Admitting: Internal Medicine

## 2009-10-09 LAB — CONVERTED CEMR LAB
ALT: 31 units/L (ref 0–35)
Albumin: 4.1 g/dL (ref 3.5–5.2)
BUN: 26 mg/dL — ABNORMAL HIGH (ref 6–23)
Bilirubin, Direct: 0 mg/dL (ref 0.0–0.3)
CO2: 28 meq/L (ref 19–32)
Calcium: 9.9 mg/dL (ref 8.4–10.5)
Chloride: 101 meq/L (ref 96–112)
Cholesterol: 175 mg/dL (ref 0–200)
Creatinine, Ser: 0.9 mg/dL (ref 0.4–1.2)
Direct LDL: 42.5 mg/dL
Glucose, Bld: 189 mg/dL — ABNORMAL HIGH (ref 70–99)
Hgb A1c MFr Bld: 7.7 % — ABNORMAL HIGH (ref 4.6–6.5)
TSH: 1.2 microintl units/mL (ref 0.35–5.50)
Total CHOL/HDL Ratio: 4
Total Protein: 7.6 g/dL (ref 6.0–8.3)
Triglycerides: 719 mg/dL — ABNORMAL HIGH (ref 0.0–149.0)

## 2009-10-16 ENCOUNTER — Ambulatory Visit: Payer: Self-pay | Admitting: Internal Medicine

## 2009-12-01 ENCOUNTER — Ambulatory Visit: Payer: Self-pay | Admitting: Internal Medicine

## 2010-02-19 ENCOUNTER — Ambulatory Visit: Payer: Self-pay | Admitting: Internal Medicine

## 2010-02-19 LAB — CONVERTED CEMR LAB
ALT: 20 units/L (ref 0–35)
Alkaline Phosphatase: 52 units/L (ref 39–117)
BUN: 22 mg/dL (ref 6–23)
Bilirubin, Direct: 0.1 mg/dL (ref 0.0–0.3)
Creatinine, Ser: 0.8 mg/dL (ref 0.4–1.2)
GFR calc non Af Amer: 74.29 mL/min (ref >60)
Glucose, Bld: 158 mg/dL — ABNORMAL HIGH (ref 70–99)
Hgb A1c MFr Bld: 6.9 % — ABNORMAL HIGH (ref 4.6–6.5)
Potassium: 3.9 meq/L (ref 3.5–5.1)
Total Bilirubin: 0.3 mg/dL (ref 0.3–1.2)
Total Protein: 7.3 g/dL (ref 6.0–8.3)

## 2010-02-26 ENCOUNTER — Ambulatory Visit: Payer: Self-pay | Admitting: Internal Medicine

## 2010-04-19 ENCOUNTER — Encounter: Payer: Self-pay | Admitting: *Deleted

## 2010-06-25 ENCOUNTER — Ambulatory Visit: Payer: Self-pay | Admitting: Internal Medicine

## 2010-06-25 LAB — CONVERTED CEMR LAB
ALT: 21 units/L (ref 0–35)
AST: 21 units/L (ref 0–37)
BUN: 24 mg/dL — ABNORMAL HIGH (ref 6–23)
Bilirubin, Direct: 0.1 mg/dL (ref 0.0–0.3)
CO2: 29 meq/L (ref 19–32)
Glucose, Bld: 175 mg/dL — ABNORMAL HIGH (ref 70–99)
HDL: 35.9 mg/dL — ABNORMAL LOW (ref >39.00)
Potassium: 4.3 meq/L (ref 3.5–5.1)
Sodium: 140 meq/L (ref 135–145)
Total Bilirubin: 0.3 mg/dL (ref 0.3–1.2)
Total CHOL/HDL Ratio: 5
Triglycerides: 625 mg/dL — ABNORMAL HIGH (ref 0.0–149.0)

## 2010-07-02 ENCOUNTER — Ambulatory Visit: Payer: Self-pay | Admitting: Internal Medicine

## 2010-08-13 ENCOUNTER — Ambulatory Visit
Admission: RE | Admit: 2010-08-13 | Discharge: 2010-08-13 | Payer: Self-pay | Source: Home / Self Care | Attending: Internal Medicine | Admitting: Internal Medicine

## 2010-08-17 NOTE — Assessment & Plan Note (Signed)
Summary: 4 month rov/njr   Vital Signs:  Patient profile:   77 year old female Weight:      190 pounds Temp:     98.3 degrees F oral Pulse rate:   72 / minute BP sitting:   120 / 80  (right arm) Cuff size:   regular  Vitals Entered By: Romualdo Bolk, CMA (AAMA) (February 26, 2010 8:10 AM) CC: Follow-up visit on labs   CC:  Follow-up visit on labs.  History of Present Illness:  Follow-Up Visit      This is a 77 year old woman who presents for Follow-up visit.  The patient complains of SOB (chronic and unchanged), but denies chest pain and palpitations.  Since the last visit the patient notes no new problems or concerns.  The patient reports taking meds as prescribed.  When questioned about possible medication side effects, the patient notes none.  she also has chronic, unchanged back pain  Preventive Screening-Counseling & Management  Alcohol-Tobacco     Smoking Status: quit > 6 months     Year Started: 1949     Year Quit: 2008  Caffeine-Diet-Exercise     Does Patient Exercise: no  Current Medications (verified): 1)  Advair Diskus 100-50 Mcg/dose Misc (Fluticasone-Salmeterol) .... Inhale 1 Puff As Directed Twice A Day As Needed 2)  Albuterol Sulfate (2.5 Mg/22ml) 0.083% Nebu (Albuterol Sulfate) .... Inhale 1 Vial Using Nebulizer Four Times A Day As Needed 3)  Aspir-81 81 Mg Tbec (Aspirin) .... Take 1 Tablet By Mouth Once A Day 4)  Enalapril Maleate 20 Mg Tabs (Enalapril Maleate) .Marland Kitchen.. 1 By Mouth Two Times A Day 5)  Folic Acid 400 Mcg Tabs (Folic Acid) .... Take 1 Once A Day 6)  Metformin Hcl 1000 Mg Tabs (Metformin Hcl) .... Take 1/2  Tablet By Mouth Two Times A Day 7)  Omega-3 1000 Mg Caps (Omega-3 Fatty Acids) .... Take 4 Once A Day 8)  Crestor 40 Mg Tabs (Rosuvastatin Calcium) .Marland Kitchen.. 1 Tablet By Mouth Daily 9)  Vitamin E 400 Unit Caps (Vitamin E) .... Take 1 Once A Day 10)  Omeprazole 20 Mg Cpdr (Omeprazole) .... One By Mouth Daily 11)  Spiriva Handihaler 18 Mcg  Caps  (Tiotropium Bromide Monohydrate) .... Contents of One Capsule Inhaled Daily 12)  Metoprolol-Hydrochlorothiazide 100-25 Mg Tabs (Metoprolol-Hydrochlorothiazide) .... Take 1 Tablet By Mouth Once A Day 13)  Accu-Chek Compact Test Drum  Strp (Glucose Blood) 14)  Meloxicam 7.5 Mg  Tabs (Meloxicam) .... One By Mouth Daily 15)  Lasix 20 Mg Tabs (Furosemide) .... Take 1 Tablet By Mouth Once A Day 16)  Klor-Con M20 20 Meq Cr-Tabs (Potassium Chloride Crys Cr) .... Take 1 Tablet By Mouth Once A Day 17)  Meclizine Hcl 25 Mg Tabs (Meclizine Hcl) .... Take 1 Tablet By Mouth Two Times A Day As Needed 18)  Fenofibrate Micronized 134 Mg Caps (Fenofibrate Micronized) .... Take 1 Tablet By Mouth Once A Day 19)  Amlodipine Besylate 5 Mg  Tabs (Amlodipine Besylate) .Marland Kitchen.. 1 By Mouth Every Day 20)  Glimepiride 2 Mg Tabs (Glimepiride) .Marland Kitchen.. 1 By Mouth Daily 21)  Augmentin 875-125 Mg Tabs (Amoxicillin-Pot Clavulanate) .Marland Kitchen.. 1 By Mouth 2 Times Daily  Allergies (verified): 1)  ! Morphine Sulfate Cr (Morphine Sulfate) 2)  ! Codeine Sulfate (Codeine Sulfate)  Physical Exam  General:  alert and well-developed.   Head:  normocephalic and atraumatic.   Eyes:  pupils equal and pupils round.   Ears:  R ear normal  and L ear normal.   Neck:  No deformities, masses, or tenderness noted. Chest Wall:  No deformities, masses, or tenderness noted. Heart:  regular rhythm and no murmur.   Abdomen:  Bowel sounds positive,abdomen soft and non-tender without masses, organomegaly or hernias noted. Msk:  No deformity or scoliosis noted of thoracic or lumbar spine.   Neurologic:  cranial nerves II-XII intact and gait normal.     Impression & Recommendations:  Problem # 1:  ABSCESS, TOOTH (ICD-522.5) resolved  Problem # 2:  DIABETES MELLITUS, TYPE II (ICD-250.00) controlled continue current medications  Her updated medication list for this problem includes:    Aspir-81 81 Mg Tbec (Aspirin) .Marland Kitchen... Take 1 tablet by mouth once a  day    Enalapril Maleate 20 Mg Tabs (Enalapril maleate) .Marland Kitchen... 1 by mouth two times a day    Metformin Hcl 1000 Mg Tabs (Metformin hcl) .Marland Kitchen... Take 1/2  tablet by mouth two times a day    Glimepiride 2 Mg Tabs (Glimepiride) .Marland Kitchen... 1 by mouth daily  Labs Reviewed: Creat: 0.8 (02/19/2010)     Last Eye Exam: normal-pt's report (09/15/2009) Reviewed HgBA1c results: 6.9 (02/19/2010)  7.7 (10/09/2009)  Problem # 3:  HYPERLIPIDEMIA (ICD-272.4)  some improvement will not change meds advised aggressive low fat diet and weight loss Her updated medication list for this problem includes:    Crestor 40 Mg Tabs (Rosuvastatin calcium) .Marland Kitchen... 1 tablet by mouth daily    Fenofibrate Micronized 134 Mg Caps (Fenofibrate micronized) .Marland Kitchen... Take 1 tablet by mouth once a day  Labs Reviewed: SGOT: 23 (02/19/2010)   SGPT: 20 (02/19/2010)  Prior 10 Yr Risk Heart Disease: N/A (08/10/2007)   HDL:39.70 (02/19/2010), 46.00 (10/09/2009)  LDL:DEL (09/22/2008), DEL (05/19/2008)  Chol:171 (02/19/2010), 175 (10/09/2009)  Trig:657.0 Triglyceride is over 400; calculations on Lipids are invalid. mg/dL (04/54/0981), 191.4 (78/29/5621)  Problem # 4:  CORONARY ARTERY DISEASE (ICD-414.00)  no sxs continue current medications  Her updated medication list for this problem includes:    Aspir-81 81 Mg Tbec (Aspirin) .Marland Kitchen... Take 1 tablet by mouth once a day    Enalapril Maleate 20 Mg Tabs (Enalapril maleate) .Marland Kitchen... 1 by mouth two times a day    Metoprolol-hydrochlorothiazide 100-25 Mg Tabs (Metoprolol-hydrochlorothiazide) .Marland Kitchen... Take 1 tablet by mouth once a day    Lasix 20 Mg Tabs (Furosemide) .Marland Kitchen... Take 1 tablet by mouth once a day    Amlodipine Besylate 5 Mg Tabs (Amlodipine besylate) .Marland Kitchen... 1 by mouth every day  Labs Reviewed: Chol: 171 (02/19/2010)   HDL: 39.70 (02/19/2010)   LDL: DEL (09/22/2008)   TG: 657.0 Triglyceride is over 400; calculations on Lipids are invalid. mg/dL (30/86/5784)  Complete Medication List: 1)   Advair Diskus 100-50 Mcg/dose Misc (Fluticasone-salmeterol) .... Inhale 1 puff as directed twice a day as needed 2)  Albuterol Sulfate (2.5 Mg/50ml) 0.083% Nebu (Albuterol sulfate) .... Inhale 1 vial using nebulizer four times a day as needed 3)  Aspir-81 81 Mg Tbec (Aspirin) .... Take 1 tablet by mouth once a day 4)  Enalapril Maleate 20 Mg Tabs (Enalapril maleate) .Marland Kitchen.. 1 by mouth two times a day 5)  Folic Acid 400 Mcg Tabs (Folic acid) .... Take 1 once a day 6)  Metformin Hcl 1000 Mg Tabs (Metformin hcl) .... Take 1/2  tablet by mouth two times a day 7)  Omega-3 1000 Mg Caps (Omega-3 fatty acids) .... Take 4 once a day 8)  Crestor 40 Mg Tabs (Rosuvastatin calcium) .Marland Kitchen.. 1 tablet by mouth daily  9)  Vitamin E 400 Unit Caps (Vitamin e) .... Take 1 once a day 10)  Omeprazole 20 Mg Cpdr (Omeprazole) .... One by mouth daily 11)  Spiriva Handihaler 18 Mcg Caps (Tiotropium bromide monohydrate) .... Contents of one capsule inhaled daily 12)  Metoprolol-hydrochlorothiazide 100-25 Mg Tabs (Metoprolol-hydrochlorothiazide) .... Take 1 tablet by mouth once a day 13)  Accu-chek Compact Test Drum Strp (Glucose blood) 14)  Meloxicam 7.5 Mg Tabs (Meloxicam) .... One by mouth daily 15)  Lasix 20 Mg Tabs (Furosemide) .... Take 1 tablet by mouth once a day 16)  Klor-con M20 20 Meq Cr-tabs (Potassium chloride crys cr) .... Take 1 tablet by mouth once a day 17)  Meclizine Hcl 25 Mg Tabs (Meclizine hcl) .... Take 1 tablet by mouth two times a day as needed 18)  Fenofibrate Micronized 134 Mg Caps (Fenofibrate micronized) .... Take 1 tablet by mouth once a day 19)  Amlodipine Besylate 5 Mg Tabs (Amlodipine besylate) .Marland Kitchen.. 1 by mouth every day 20)  Glimepiride 2 Mg Tabs (Glimepiride) .Marland Kitchen.. 1 by mouth daily 21)  Augmentin 875-125 Mg Tabs (Amoxicillin-pot clavulanate) .Marland Kitchen.. 1 by mouth 2 times daily  Patient Instructions: 1)  Please schedule a follow-up appointment in 4 months. 2)  labs one week prior to visit 3)   lipids---272.4 4)  lfts-995.2 5)  bmet-995.2 6)  A1C-250.02 7)

## 2010-08-17 NOTE — Procedures (Signed)
Summary: Colonoscopy Report  Colonoscopy Report   Imported By: Erle Crocker 02/26/2010 09:48:42  _____________________________________________________________________  External Attachment:    Type:   Image     Comment:   External Document

## 2010-08-17 NOTE — Assessment & Plan Note (Signed)
Summary: 4 month rov/njr   Vital Signs:  Patient profile:   77 year old female Weight:      188 pounds Temp:     97.4 degrees F oral Pulse rate:   78 / minute Pulse rhythm:   regular Resp:     12 per minute BP sitting:   144 / 80  (left arm) Cuff size:   regular  Vitals Entered By: Gladis Riffle, RN (October 16, 2009 10:25 AM) CC: 4 month rov--c/o diarrhea from metformin--discuss crestor and glimepiride Is Patient Diabetic? Yes Did you bring your meter with you today? No Comments CBGs 160-200 at home   CC:  4 month rov--c/o diarrhea from metformin--discuss crestor and glimepiride.  History of Present Illness:  Follow-Up Visit      This is a 77 year old woman who presents for Follow-up visit.  The patient denies chest pain and palpitations.  Since the last visit the patient notes no new problems or concerns.  The patient reports taking meds as prescribed.  When questioned about possible medication side effects, the patient notes none.    All other systems reviewed and were negative     Preventive Screening-Counseling & Management  Alcohol-Tobacco     Smoking Status: quit > 6 months     Year Started: 1949     Year Quit: 2008  Current Problems (verified): 1)  Diabetes Mellitus, Type II  (ICD-250.00) 2)  Hyperlipidemia  (ICD-272.4) 3)  Hypertension  (ICD-401.9) 4)  Coronary Artery Disease  (ICD-414.00) 5)  COPD  (ICD-496)  Current Medications (verified): 1)  Advair Diskus 100-50 Mcg/dose Misc (Fluticasone-Salmeterol) .... Inhale 1 Puff As Directed Twice A Day As Needed 2)  Albuterol Sulfate (2.5 Mg/61ml) 0.083% Nebu (Albuterol Sulfate) .... Inhale 1 Vial Using Nebulizer Four Times A Day As Needed 3)  Aspir-81 81 Mg Tbec (Aspirin) .... Take 1 Tablet By Mouth Once A Day 4)  Enalapril Maleate 20 Mg Tabs (Enalapril Maleate) .Marland Kitchen.. 1 By Mouth Two Times A Day 5)  Folic Acid 400 Mcg Tabs (Folic Acid) .... Take 1 Once A Day 6)  Metformin Hcl 1000 Mg Tabs (Metformin Hcl) .... Take 1  Tablet By Mouth Two Times A Day 7)  Omega-3 1000 Mg Caps (Omega-3 Fatty Acids) .... Take 4 Once A Day 8)  Crestor 40 Mg Tabs (Rosuvastatin Calcium) .Marland Kitchen.. 1 Tablet By Mouth Daily 9)  Vitamin E 400 Unit Caps (Vitamin E) .... Take 1 Once A Day 10)  Omeprazole 20 Mg Cpdr (Omeprazole) .... One By Mouth Daily 11)  Spiriva Handihaler 18 Mcg  Caps (Tiotropium Bromide Monohydrate) .... Contents of One Capsule Inhaled Daily 12)  Metoprolol-Hydrochlorothiazide 100-25 Mg Tabs (Metoprolol-Hydrochlorothiazide) .... Take 1 Tablet By Mouth Once A Day 13)  Accu-Chek Compact Test Drum  Strp (Glucose Blood) 14)  Meloxicam 7.5 Mg  Tabs (Meloxicam) .... One By Mouth Daily 15)  Lasix 20 Mg Tabs (Furosemide) .... Take 1 Tablet By Mouth Once A Day 16)  Klor-Con M20 20 Meq Cr-Tabs (Potassium Chloride Crys Cr) .... Take 1 Tablet By Mouth Once A Day 17)  Meclizine Hcl 25 Mg Tabs (Meclizine Hcl) .... Take 1 Tablet By Mouth Two Times A Day As Needed 18)  Lipofen 50 Mg Caps (Fenofibrate) .... One By Mouth Daily 19)  Amlodipine Besylate 5 Mg  Tabs (Amlodipine Besylate) .Marland Kitchen.. 1 By Mouth Every Day 20)  Cozaar 100 Mg Tabs (Losartan Potassium) .... Take 1 Tablet By Mouth Once A Day  Allergies: 1)  !  Morphine Sulfate Cr (Morphine Sulfate) 2)  ! Codeine Sulfate (Codeine Sulfate)  Past History:  Past Medical History: Last updated: 03/26/2007 COPD Coronary artery disease Hypertension Hyperlipidemia Diabetes mellitus, type II  Past Surgical History: Last updated: 06/08/2009 Cholecystectomy Percutaneous transluminal coronary angioplasty Cataract extraction---left (2010, scheduled for right 11/10)  Social History: Last updated: 09/21/2007 Former Smoker---quit 3/08 Single Regular exercise-no  Risk Factors: Exercise: no (03/26/2007)  Risk Factors: Smoking Status: quit > 6 months (10/16/2009)  Review of Systems       All other systems reviewed and were negative   Physical Exam  General:  alert and  well-developed.   Head:  normocephalic and atraumatic.   Eyes:  pupils equal and pupils round.   Neck:  No deformities, masses, or tenderness noted. Chest Wall:  No deformities, masses, or tenderness noted. Lungs:  normal respiratory effort and no intercostal retractions.   Heart:  normal rate and regular rhythm.   Abdomen:  Bowel sounds positive,abdomen soft and non-tender without masses, organomegaly or hernias noted. Msk:  No deformity or scoliosis noted of thoracic or lumbar spine.   Neurologic:  cranial nerves II-XII intact and strength normal in all extremities.   Skin:  turgor normal and color normal.   Psych:  normally interactive and good eye contact.    Diabetes Management Exam:    Eye Exam:       Eye Exam done elsewhere          Date: 09/15/2009          Results: normal-pt's report          Done by: ophthal   Impression & Recommendations:  Problem # 1:  DIABETES MELLITUS, TYPE II (ICD-250.00) reviewed meds continue current medications (stop cozaar--pt refuses) The following medications were removed from the medication list:    Cozaar 100 Mg Tabs (Losartan potassium) .Marland Kitchen... Take 1 tablet by mouth once a day Her updated medication list for this problem includes:    Aspir-81 81 Mg Tbec (Aspirin) .Marland Kitchen... Take 1 tablet by mouth once a day    Enalapril Maleate 20 Mg Tabs (Enalapril maleate) .Marland Kitchen... 1 by mouth two times a day    Metformin Hcl 1000 Mg Tabs (Metformin hcl) .Marland Kitchen... Take 1/2  tablet by mouth two times a day    Glimepiride 2 Mg Tabs (Glimepiride) .Marland Kitchen... 1 by mouth daily  Labs Reviewed: Creat: 0.9 (10/09/2009)     Last Eye Exam: normal-pt's report (09/15/2009) Reviewed HgBA1c results: 7.7 (10/09/2009)  7.7 (06/01/2009)  Problem # 2:  HYPERLIPIDEMIA (ICD-272.4) controlled continue current medications  Her updated medication list for this problem includes:    Crestor 40 Mg Tabs (Rosuvastatin calcium) .Marland Kitchen... 1 tablet by mouth daily    Fenofibrate Micronized 134 Mg  Caps (Fenofibrate micronized) .Marland Kitchen... Take 1 tablet by mouth once a day  Labs Reviewed: SGOT: 37 (10/09/2009)   SGPT: 31 (10/09/2009)  Prior 10 Yr Risk Heart Disease: N/A (08/10/2007)   HDL:46.00 (10/09/2009), 36.20 (06/01/2009)  LDL:DEL (09/22/2008), DEL (05/19/2008)  Chol:175 (10/09/2009), 196 (06/01/2009)  Trig:719.0 (10/09/2009), 723.0 (06/01/2009)  Problem # 3:  COPD (ICD-496)  Her updated medication list for this problem includes:    Advair Diskus 100-50 Mcg/dose Misc (Fluticasone-salmeterol) ..... Inhale 1 puff as directed twice a day as needed    Albuterol Sulfate (2.5 Mg/81ml) 0.083% Nebu (Albuterol sulfate) ..... Inhale 1 vial using nebulizer four times a day as needed    Spiriva Handihaler 18 Mcg Caps (Tiotropium bromide monohydrate) .Marland Kitchen... Contents of one capsule inhaled  daily  Problem # 4:  CORONARY ARTERY DISEASE (ICD-414.00) no sxs continue current medications  The following medications were removed from the medication list:    Cozaar 100 Mg Tabs (Losartan potassium) .Marland Kitchen... Take 1 tablet by mouth once a day Her updated medication list for this problem includes:    Aspir-81 81 Mg Tbec (Aspirin) .Marland Kitchen... Take 1 tablet by mouth once a day    Enalapril Maleate 20 Mg Tabs (Enalapril maleate) .Marland Kitchen... 1 by mouth two times a day    Metoprolol-hydrochlorothiazide 100-25 Mg Tabs (Metoprolol-hydrochlorothiazide) .Marland Kitchen... Take 1 tablet by mouth once a day    Lasix 20 Mg Tabs (Furosemide) .Marland Kitchen... Take 1 tablet by mouth once a day    Amlodipine Besylate 5 Mg Tabs (Amlodipine besylate) .Marland Kitchen... 1 by mouth every day  Complete Medication List: 1)  Advair Diskus 100-50 Mcg/dose Misc (Fluticasone-salmeterol) .... Inhale 1 puff as directed twice a day as needed 2)  Albuterol Sulfate (2.5 Mg/3ml) 0.083% Nebu (Albuterol sulfate) .... Inhale 1 vial using nebulizer four times a day as needed 3)  Aspir-81 81 Mg Tbec (Aspirin) .... Take 1 tablet by mouth once a day 4)  Enalapril Maleate 20 Mg Tabs (Enalapril  maleate) .Marland Kitchen.. 1 by mouth two times a day 5)  Folic Acid 400 Mcg Tabs (Folic acid) .... Take 1 once a day 6)  Metformin Hcl 1000 Mg Tabs (Metformin hcl) .... Take 1/2  tablet by mouth two times a day 7)  Omega-3 1000 Mg Caps (Omega-3 fatty acids) .... Take 4 once a day 8)  Crestor 40 Mg Tabs (Rosuvastatin calcium) .Marland Kitchen.. 1 tablet by mouth daily 9)  Vitamin E 400 Unit Caps (Vitamin e) .... Take 1 once a day 10)  Omeprazole 20 Mg Cpdr (Omeprazole) .... One by mouth daily 11)  Spiriva Handihaler 18 Mcg Caps (Tiotropium bromide monohydrate) .... Contents of one capsule inhaled daily 12)  Metoprolol-hydrochlorothiazide 100-25 Mg Tabs (Metoprolol-hydrochlorothiazide) .... Take 1 tablet by mouth once a day 13)  Accu-chek Compact Test Drum Strp (Glucose blood) 14)  Meloxicam 7.5 Mg Tabs (Meloxicam) .... One by mouth daily 15)  Lasix 20 Mg Tabs (Furosemide) .... Take 1 tablet by mouth once a day 16)  Klor-con M20 20 Meq Cr-tabs (Potassium chloride crys cr) .... Take 1 tablet by mouth once a day 17)  Meclizine Hcl 25 Mg Tabs (Meclizine hcl) .... Take 1 tablet by mouth two times a day as needed 18)  Fenofibrate Micronized 134 Mg Caps (Fenofibrate micronized) .... Take 1 tablet by mouth once a day 19)  Amlodipine Besylate 5 Mg Tabs (Amlodipine besylate) .Marland Kitchen.. 1 by mouth every day 20)  Glimepiride 2 Mg Tabs (Glimepiride) .Marland Kitchen.. 1 by mouth daily  Patient Instructions: 1)  Please schedule a follow-up appointment in 4 months. 2)  labs one week prior to visit 3)  lipids---272.4 4)  lfts-995.2 5)  bmet-995.2 6)  A1C-250.02 7)     Prescriptions: GLIMEPIRIDE 2 MG TABS (GLIMEPIRIDE) 1 by mouth daily  #90 x 3   Entered and Authorized by:   Birdie Sons MD   Signed by:   Birdie Sons MD on 10/16/2009   Method used:   Electronically to        [facility] [address] (retail)       [address]       [place], [place]  [address]       Ph: [phone] or [phone]  Fax: [phone]   RxID:   [id number]

## 2010-08-17 NOTE — Progress Notes (Signed)
Summary: REFILL  Phone Note Refill Request Message from:  Fax from Pharmacy  Refills Requested: Medication #1:  ADVAIR DISKUS 100-50 MCG/DOSE MISC Inhale 1 puff as directed twice a day as needed [address]pidio [address] [facility] [address] [phone]    FAX---[phone]  Initial call taken by: Warnell Forester,  September 08, 2009 9:26 AM    Prescriptions: ADVAIR DISKUS 100-50 MCG/DOSE MISC (FLUTICASONE-SALMETEROL) Inhale 1 puff as directed twice a day as needed  #60 Each x 0   Entered by:   Lynann Beaver CMA   Authorized by:   Birdie Sons MD   Signed by:   Lynann Beaver CMA on 09/08/2009   Method used:   Electronically to        [facility] [address] (retail)       [address]       [place], [place]  [address]       Ph: [phone] or [phone]       Fax: [phone]   RxID:   [id number]

## 2010-08-17 NOTE — Progress Notes (Signed)
Summary: requests med change  Prescriptions: COZAAR 100 MG TABS (LOSARTAN POTASSIUM) Take 1 tablet by mouth once a day  #90 x 3   Entered and Authorized by:   Birdie Sons MD   Signed by:   Birdie Sons MD on 08/04/2009   Method used:   Electronically to        [facility] [address] (retail)       [address]       [place], [place]  [address]       Ph: [phone] or [phone]       Fax: [phone]   RxID:   [id number] AMLODIPINE BESYLATE 5 MG  TABS (AMLODIPINE BESYLATE) 1 by mouth every day  #90 x 3   Entered and Authorized by:   Birdie Sons MD   Signed by:   Birdie Sons MD on 08/04/2009   Method used:   Electronically to        [facility] [address] (retail)       [address]       [place], [place]  [address]       Ph: [phone] or [phone]       Fax: [phone]   RxID:   [id number]  Pt needs substitiutes fotr azor and lipofen as insurance will not pay.  Called back today and is out of medication.  Please call back pt at [phone] or son, [name], at [phone].  Pt has samples ready for pick up for two weeks until this can be resolved.   See med list  Appended Document: requests med change Patient notified.

## 2010-08-17 NOTE — Assessment & Plan Note (Signed)
Summary: SOB/dm   Vital Signs:  Patient profile:   77 year old female Weight:      189 pounds O2 Sat:      100 % on Room air Temp:     98.4 degrees F oral Pulse rate:   72 / minute Pulse rhythm:   regular Resp:     16 per minute BP sitting:   132 / 70  (left arm) Cuff size:   regular  Vitals Entered By: Gladis Riffle, RN (Dec 01, 2009 11:09 AM)  O2 Flow:  Room air CC: c/o SOB x 4-5 days--CBGs 100-120 but 190 this AM at home Is Patient Diabetic? Yes Did you bring your meter with you today? No   CC:  c/o SOB x 4-5 days--CBGs 100-120 but 190 this AM at home.  History of Present Illness: c/o SOB and cough for 5 days pt with COPD cescribes croupy cough with wheeze no assoicated CP, LE edema no fever  descreibes symptoms as moderately severe above story confirmed with dtr who is present during the entire exam  All other systems reviewed and were negative   Preventive Screening-Counseling & Management  Alcohol-Tobacco     Smoking Status: quit > 6 months     Year Started: 1949     Year Quit: 2008  Current Problems (verified): 1)  Diabetes Mellitus, Type II  (ICD-250.00) 2)  Hyperlipidemia  (ICD-272.4) 3)  Hypertension  (ICD-401.9) 4)  Coronary Artery Disease  (ICD-414.00) 5)  COPD  (ICD-496)  Current Medications (verified): 1)  Advair Diskus 100-50 Mcg/dose Misc (Fluticasone-Salmeterol) .... Inhale 1 Puff As Directed Twice A Day As Needed 2)  Albuterol Sulfate (2.5 Mg/67ml) 0.083% Nebu (Albuterol Sulfate) .... Inhale 1 Vial Using Nebulizer Four Times A Day As Needed 3)  Aspir-81 81 Mg Tbec (Aspirin) .... Take 1 Tablet By Mouth Once A Day 4)  Enalapril Maleate 20 Mg Tabs (Enalapril Maleate) .Marland Kitchen.. 1 By Mouth Two Times A Day 5)  Folic Acid 400 Mcg Tabs (Folic Acid) .... Take 1 Once A Day 6)  Metformin Hcl 1000 Mg Tabs (Metformin Hcl) .... Take 1/2  Tablet By Mouth Two Times A Day 7)  Omega-3 1000 Mg Caps (Omega-3 Fatty Acids) .... Take 4 Once A Day 8)  Crestor 40 Mg Tabs  (Rosuvastatin Calcium) .Marland Kitchen.. 1 Tablet By Mouth Daily 9)  Vitamin E 400 Unit Caps (Vitamin E) .... Take 1 Once A Day 10)  Omeprazole 20 Mg Cpdr (Omeprazole) .... One By Mouth Daily 11)  Spiriva Handihaler 18 Mcg  Caps (Tiotropium Bromide Monohydrate) .... Contents of One Capsule Inhaled Daily 12)  Metoprolol-Hydrochlorothiazide 100-25 Mg Tabs (Metoprolol-Hydrochlorothiazide) .... Take 1 Tablet By Mouth Once A Day 13)  Accu-Chek Compact Test Drum  Strp (Glucose Blood) 14)  Meloxicam 7.5 Mg  Tabs (Meloxicam) .... One By Mouth Daily 15)  Lasix 20 Mg Tabs (Furosemide) .... Take 1 Tablet By Mouth Once A Day 16)  Klor-Con M20 20 Meq Cr-Tabs (Potassium Chloride Crys Cr) .... Take 1 Tablet By Mouth Once A Day 17)  Meclizine Hcl 25 Mg Tabs (Meclizine Hcl) .... Take 1 Tablet By Mouth Two Times A Day As Needed 18)  Fenofibrate Micronized 134 Mg Caps (Fenofibrate Micronized) .... Take 1 Tablet By Mouth Once A Day 19)  Amlodipine Besylate 5 Mg  Tabs (Amlodipine Besylate) .Marland Kitchen.. 1 By Mouth Every Day 20)  Glimepiride 2 Mg Tabs (Glimepiride) .Marland Kitchen.. 1 By Mouth Daily  Allergies: 1)  ! Morphine Sulfate Cr (Morphine Sulfate) 2)  !  Codeine Sulfate (Codeine Sulfate)  Past History:  Past Medical History: Last updated: 03/26/2007 COPD Coronary artery disease Hypertension Hyperlipidemia Diabetes mellitus, type II  Past Surgical History: Last updated: 06/08/2009 Cholecystectomy Percutaneous transluminal coronary angioplasty Cataract extraction---left (2010, scheduled for right 11/10)  Social History: Last updated: 09/21/2007 Former Smoker---quit 3/08 Single Regular exercise-no  Risk Factors: Exercise: no (03/26/2007)  Risk Factors: Smoking Status: quit > 6 months (12/01/2009)  Review of Systems       All other systems reviewed and were negative   Physical Exam  General:  alert and well-developed.   Head:  normocephalic and atraumatic.   Ears:  R ear normal and L ear normal.   Neck:  No  deformities, masses, or tenderness noted. Chest Wall:  No deformities, masses, or tenderness noted. Lungs:  normal respiratory effort and no intercostal retractions.  no wheeze at present (just took albuterol neb) Heart:  normal rate and regular rhythm.   Abdomen:  Bowel sounds positive,abdomen soft and non-tender without masses, organomegaly or hernias noted. Msk:  No deformity or scoliosis noted of thoracic or lumbar spine.   Pulses:  R radial normal and L radial normal.   Neurologic:  cranial nerves II-XII intact and gait normal.   Skin:  turgor normal and color normal.   Psych:  memory intact for recent and remote and good eye contact.     Impression & Recommendations:  Problem # 1:  COPD (ICD-496) suspect exacerbation not infectious clinically trial prednisone side effects discussed call for CBG greater than 250 Her updated medication list for this problem includes:    Advair Diskus 100-50 Mcg/dose Misc (Fluticasone-salmeterol) ..... Inhale 1 puff as directed twice a day as needed    Albuterol Sulfate (2.5 Mg/68ml) 0.083% Nebu (Albuterol sulfate) ..... Inhale 1 vial using nebulizer four times a day as needed    Spiriva Handihaler 18 Mcg Caps (Tiotropium bromide monohydrate) .Marland Kitchen... Contents of one capsule inhaled daily  Problem # 2:  DIABETES MELLITUS, TYPE II (ICD-250.00) continue current medications  Her updated medication list for this problem includes:    Aspir-81 81 Mg Tbec (Aspirin) .Marland Kitchen... Take 1 tablet by mouth once a day    Enalapril Maleate 20 Mg Tabs (Enalapril maleate) .Marland Kitchen... 1 by mouth two times a day    Metformin Hcl 1000 Mg Tabs (Metformin hcl) .Marland Kitchen... Take 1/2  tablet by mouth two times a day    Glimepiride 2 Mg Tabs (Glimepiride) .Marland Kitchen... 1 by mouth daily  Labs Reviewed: Creat: 0.9 (10/09/2009)     Last Eye Exam: normal-pt's report (09/15/2009) Reviewed HgBA1c results: 7.7 (10/09/2009)  7.7 (06/01/2009)  Complete Medication List: 1)  Advair Diskus 100-50 Mcg/dose  Misc (Fluticasone-salmeterol) .... Inhale 1 puff as directed twice a day as needed 2)  Albuterol Sulfate (2.5 Mg/34ml) 0.083% Nebu (Albuterol sulfate) .... Inhale 1 vial using nebulizer four times a day as needed 3)  Aspir-81 81 Mg Tbec (Aspirin) .... Take 1 tablet by mouth once a day 4)  Enalapril Maleate 20 Mg Tabs (Enalapril maleate) .Marland Kitchen.. 1 by mouth two times a day 5)  Folic Acid 400 Mcg Tabs (Folic acid) .... Take 1 once a day 6)  Metformin Hcl 1000 Mg Tabs (Metformin hcl) .... Take 1/2  tablet by mouth two times a day 7)  Omega-3 1000 Mg Caps (Omega-3 fatty acids) .... Take 4 once a day 8)  Crestor 40 Mg Tabs (Rosuvastatin calcium) .Marland Kitchen.. 1 tablet by mouth daily 9)  Vitamin E 400 Unit Caps (Vitamin e) .Marland KitchenMarland KitchenMarland Kitchen  Take 1 once a day 10)  Omeprazole 20 Mg Cpdr (Omeprazole) .... One by mouth daily 11)  Spiriva Handihaler 18 Mcg Caps (Tiotropium bromide monohydrate) .... Contents of one capsule inhaled daily 12)  Metoprolol-hydrochlorothiazide 100-25 Mg Tabs (Metoprolol-hydrochlorothiazide) .... Take 1 tablet by mouth once a day 13)  Accu-chek Compact Test Drum Strp (Glucose blood) 14)  Meloxicam 7.5 Mg Tabs (Meloxicam) .... One by mouth daily 15)  Lasix 20 Mg Tabs (Furosemide) .... Take 1 tablet by mouth once a day 16)  Klor-con M20 20 Meq Cr-tabs (Potassium chloride crys cr) .... Take 1 tablet by mouth once a day 17)  Meclizine Hcl 25 Mg Tabs (Meclizine hcl) .... Take 1 tablet by mouth two times a day as needed 18)  Fenofibrate Micronized 134 Mg Caps (Fenofibrate micronized) .... Take 1 tablet by mouth once a day 19)  Amlodipine Besylate 5 Mg Tabs (Amlodipine besylate) .Marland Kitchen.. 1 by mouth every day 20)  Glimepiride 2 Mg Tabs (Glimepiride) .Marland Kitchen.. 1 by mouth daily  Patient Instructions: 1)  . Prescriptions: PREDNISONE 10 MG  TABS (PREDNISONE) 4 by mouth once daily for 2 days, then 2 by mouth once daily for 2 days, then 1 by mouth once daily for 2 days  #20 x 0   Entered and Authorized by:   Birdie Sons  MD   Signed by:   Birdie Sons MD on 12/01/2009   Method used:   Electronically to        [facility] [address] [address] (retail)       [address]       [place], [place]  [address]       Ph: [phone] or [phone]       Fax: [phone]   RxID:   [id number]

## 2010-08-17 NOTE — Miscellaneous (Signed)
Summary: Immunization Entry, Flu Vaccine   Immunization History:  Influenza Immunization History:    Influenza:  historical (04/17/2010)

## 2010-08-19 ENCOUNTER — Other Ambulatory Visit: Payer: Self-pay | Admitting: Internal Medicine

## 2010-08-19 DIAGNOSIS — M1990 Unspecified osteoarthritis, unspecified site: Secondary | ICD-10-CM

## 2010-08-19 NOTE — Assessment & Plan Note (Signed)
Summary: sob/dm/copd   Vital Signs:  Patient profile:   77 year old female Weight:      196 pounds O2 Sat:      97 % on Room air Pulse rate:   94 / minute BP sitting:   142 / 80  Vitals Entered By: Kyung Rudd, CMA (August 13, 2010 1:19 PM)  O2 Flow:  Room air CC: pt c/o SOB...per pt, hurt knees last saturday and because she's having to put more energy into walking, she's been getting SOB   CC:  pt c/o SOB...per pt, hurt knees last saturday and because she's having to put more energy into walking, and she's been getting SOB.  History of Present Illness:  patient presents to clinic as a work in for evaluation of shortness of breath. Has long-standing history of COPD   and notes 6day history of shortness of breath on exertion with subjective wheezing. began after having increased knee pain  with the injury caused by dog.  denies any recent infection specifically no fever chills cough or nasal congestion. not hypoxic on room air. has not attempted medication for her knee pain. uses albuterol MDI and nebulizer for shortness of breath which helps. compliant with Advair and Spiriva. has diabetes with recent blood sugars between 100 and 140 without hypoglycemia. no other complaints  Current Medications (verified): 1)  Advair Diskus 100-50 Mcg/dose Misc (Fluticasone-Salmeterol) .... Inhale 1 Puff As Directed Twice A Day As Needed 2)  Albuterol Sulfate (2.5 Mg/86ml) 0.083% Nebu (Albuterol Sulfate) .... Inhale 1 Vial Using Nebulizer Four Times A Day As Needed 3)  Aspir-81 81 Mg Tbec (Aspirin) .... Take 1 Tablet By Mouth Once A Day 4)  Enalapril Maleate 20 Mg Tabs (Enalapril Maleate) .Marland Kitchen.. 1 By Mouth Two Times A Day 5)  Folic Acid 400 Mcg Tabs (Folic Acid) .... Take 1 Once A Day 6)  Metformin Hcl 1000 Mg Tabs (Metformin Hcl) .... Take 1/2  Tablet By Mouth Two Times A Day 7)  Omega-3 1000 Mg Caps (Omega-3 Fatty Acids) .... Take 4 Once A Day 8)  Crestor 40 Mg Tabs (Rosuvastatin Calcium) .Marland Kitchen.. 1  Tablet By Mouth Daily 9)  Vitamin E 400 Unit Caps (Vitamin E) .... Take 1 Once A Day 10)  Omeprazole 20 Mg Cpdr (Omeprazole) .... One By Mouth Daily 11)  Spiriva Handihaler 18 Mcg  Caps (Tiotropium Bromide Monohydrate) .... Contents of One Capsule Inhaled Daily 12)  Metoprolol-Hydrochlorothiazide 100-25 Mg Tabs (Metoprolol-Hydrochlorothiazide) .... Take 1 Tablet By Mouth Once A Day 13)  Accu-Chek Compact Test Drum  Strp (Glucose Blood) .... Test Three Times A Day As Directed 14)  Meloxicam 7.5 Mg  Tabs (Meloxicam) .... One By Mouth Daily 15)  Lasix 20 Mg Tabs (Furosemide) .... Take 1 Tablet By Mouth Once A Day 16)  Klor-Con M20 20 Meq Cr-Tabs (Potassium Chloride Crys Cr) .... Take 1 Tablet By Mouth Once A Day 17)  Meclizine Hcl 25 Mg Tabs (Meclizine Hcl) .... Take 1 Tablet By Mouth Two Times A Day As Needed 18)  Fenofibrate Micronized 134 Mg Caps (Fenofibrate Micronized) .... Take 1 Tablet By Mouth Once A Day 19)  Amlodipine Besylate 5 Mg  Tabs (Amlodipine Besylate) .Marland Kitchen.. 1 By Mouth Every Day 20)  Glimepiride 2 Mg Tabs (Glimepiride) .Marland Kitchen.. 1 By Mouth Daily 21)  Accu-Chek Compact Test Drum  Strp (Glucose Blood)  Allergies (verified): 1)  ! Morphine Sulfate Cr (Morphine Sulfate) 2)  ! Codeine Sulfate (Codeine Sulfate)  Past History:  Past medical, surgical,  family and social histories (including risk factors) reviewed for relevance to current acute and chronic problems.  Past Medical History: Reviewed history from 03/26/2007 and no changes required. COPD Coronary artery disease Hypertension Hyperlipidemia Diabetes mellitus, type II  Past Surgical History: Reviewed history from 06/08/2009 and no changes required. Cholecystectomy Percutaneous transluminal coronary angioplasty Cataract extraction---left (2010, scheduled for right 11/10)  Family History: Reviewed history and no changes required.  Social History: Reviewed history from 09/21/2007 and no changes required. Former  [employer] 3/08 Single Regular exercise-no  Review of Systems General:  Denies chills, fever, and sweats. Eyes:  Denies discharge, eye pain, and red eye. ENT:  Denies earache, hoarseness, nasal congestion, and postnasal drainage. Resp:  Complains of shortness of breath and wheezing; denies chest discomfort, cough, coughing up blood, and sputum productive. MS:  Complains of joint pain and low back pain.  Physical Exam  General:  Well-developed,well-nourished,in no acute distress; alert,appropriate and cooperative throughout examination Head:  Normocephalic and atraumatic without obvious abnormalities. No apparent alopecia or balding. Eyes:  pupils equal, pupils round, corneas and lenses clear, and no injection.   Ears:  no external deformities.   Nose:  no external deformity.   Neck:  No deformities, masses, or tenderness noted. Lungs:  Normal respiratory effort, chest expands symmetrically. slight intermittent left upper wheeze  Otherwise clear to auscultation without crackles or rales.no intercostal retractions and no accessory muscle use.   Heart:  Normal rate and regular rhythm. S1 and S2 normal without gallop, murmur, click, rub or other extra sounds. Skin:  turgor normal, color normal, and no rashes.     Impression & Recommendations:  Problem # 1:  COPD (ICD-496) Assessment Deteriorated  exacerbation without obvious infectious trigger. refill Proventil M.D. when necessary. Patient declines steroid injection. Begin Medrol Dosepak. Agrees to monitor blood sugar carefully and call with consistent results above 200. Followup closely if no improvement or worsening. Present to the emergency department with any worsening of shortness of breath or wheezing. Her updated medication list for this problem includes:    Advair Diskus 100-50 Mcg/dose Misc (Fluticasone-salmeterol) ..... Inhale 1 puff as directed twice a day as needed    Albuterol Sulfate (2.5 Mg/34ml) 0.083% Nebu (Albuterol  sulfate) ..... Inhale 1 vial using nebulizer four times a day as needed    Spiriva Handihaler 18 Mcg Caps (Tiotropium bromide monohydrate) .Marland Kitchen... Contents of one capsule inhaled daily    Proventil Hfa 108 (90 Base) Mcg/act Aers (Albuterol sulfate) .Marland Kitchen... 2 puffs q4-6 hours as needed shortness of breath or wheezing  Complete Medication List: 1)  Advair Diskus 100-50 Mcg/dose Misc (Fluticasone-salmeterol) .... Inhale 1 puff as directed twice a day as needed 2)  Albuterol Sulfate (2.5 Mg/39ml) 0.083% Nebu (Albuterol sulfate) .... Inhale 1 vial using nebulizer four times a day as needed 3)  Aspir-81 81 Mg Tbec (Aspirin) .... Take 1 tablet by mouth once a day 4)  Enalapril Maleate 20 Mg Tabs (Enalapril maleate) .Marland Kitchen.. 1 by mouth two times a day 5)  Folic Acid 400 Mcg Tabs (Folic acid) .... Take 1 once a day 6)  Metformin Hcl 1000 Mg Tabs (Metformin hcl) .... Take 1/2  tablet by mouth two times a day 7)  Omega-3 1000 Mg Caps (Omega-3 fatty acids) .... Take 4 once a day 8)  Crestor 40 Mg Tabs (Rosuvastatin calcium) .Marland Kitchen.. 1 tablet by mouth daily 9)  Vitamin E 400 Unit Caps (Vitamin e) .... Take 1 once a day 10)  Omeprazole 20 Mg Cpdr (Omeprazole) .Marland KitchenMarland KitchenMarland Kitchen  One by mouth daily 11)  Spiriva Handihaler 18 Mcg Caps (Tiotropium bromide monohydrate) .... Contents of one capsule inhaled daily 12)  Metoprolol-hydrochlorothiazide 100-25 Mg Tabs (Metoprolol-hydrochlorothiazide) .... Take 1 tablet by mouth once a day 13)  Accu-chek Compact Test Drum Strp (Glucose blood) .... Test three times a day as directed 14)  Meloxicam 7.5 Mg Tabs (Meloxicam) .... One by mouth daily 15)  Lasix 20 Mg Tabs (Furosemide) .... Take 1 tablet by mouth once a day 16)  Klor-con M20 20 Meq Cr-tabs (Potassium chloride crys cr) .... Take 1 tablet by mouth once a day 17)  Meclizine Hcl 25 Mg Tabs (Meclizine hcl) .... Take 1 tablet by mouth two times a day as needed 18)  Fenofibrate Micronized 134 Mg Caps (Fenofibrate micronized) .... Take 1 tablet  by mouth once a day 19)  Amlodipine Besylate 5 Mg Tabs (Amlodipine besylate) .Marland Kitchen.. 1 by mouth every day 20)  Glimepiride 2 Mg Tabs (Glimepiride) .Marland Kitchen.. 1 by mouth daily 21)  Accu-chek Compact Test Drum Strp (Glucose blood) 22)  Medrol (pak) 4 Mg Tabs (Methylprednisolone) .... As directed 23)  Proventil Hfa 108 (90 Base) Mcg/act Aers (Albuterol sulfate) .... 2 puffs q4-6 hours as needed shortness of breath or wheezing Prescriptions: PROVENTIL HFA 108 (90 BASE) MCG/ACT AERS (ALBUTEROL SULFATE) 2 puffs q4-6 hours as needed shortness of breath or wheezing  #1 x 6   Entered and Authorized by:   Edwyna Perfect MD   Signed by:   Edwyna Perfect MD on 08/13/2010   Method used:   Electronically to        [facility] [address] [address] (retail)       [address]       [place], [place]  [address]       Ph: [phone] or [phone]       Fax: [phone]   RxID:   [id number] MEDROL (PAK) 4 MG TABS (METHYLPREDNISOLONE) as directed  #1 x 0   Entered and Authorized by:   Edwyna Perfect MD   Signed by:   Edwyna Perfect MD on 08/13/2010   Method used:   Electronically to        [facility] [address] (retail)       [address]       [place], [place]  [address]       Ph: [phone] or [phone]       Fax: [phone]   RxID:   [id number]    Orders Added: 1)  Est. Patient Level III [18299]

## 2010-08-19 NOTE — Assessment & Plan Note (Signed)
Summary: 4 MONTH ROV/NJR   Vital Signs:  Patient profile:   77 year old female Weight:      196 pounds Temp:     98.5 degrees F oral Pulse rate:   84 / minute Pulse rhythm:   regular BP sitting:   126 / 72  (left arm) Cuff size:   regular  Vitals Entered By: Alfred Levins, CMA (July 02, 2010 8:22 AM) CC: f/u   CC:  f/u.  History of Present Illness:  Follow-Up Visit      This is a 77 year old woman who presents for Follow-up visit.  The patient denies chest pain and palpitations.  Since the last visit the patient notes no new problems or concerns--except episode of vertigo two days ago.  The patient reports taking meds as prescribed, not monitoring BP, and dietary noncompliance.  When questioned about possible medication side effects, the patient notes none.    All other systems reviewed and were negative   Current Problems (verified): 1)  Diabetes Mellitus, Type II  (ICD-250.00) 2)  Hyperlipidemia  (ICD-272.4) 3)  Hypertension  (ICD-401.9) 4)  Coronary Artery Disease  (ICD-414.00) 5)  COPD  (ICD-496)  Current Medications (verified): 1)  Advair Diskus 100-50 Mcg/dose Misc (Fluticasone-Salmeterol) .... Inhale 1 Puff As Directed Twice A Day As Needed 2)  Albuterol Sulfate (2.5 Mg/73ml) 0.083% Nebu (Albuterol Sulfate) .... Inhale 1 Vial Using Nebulizer Four Times A Day As Needed 3)  Aspir-81 81 Mg Tbec (Aspirin) .... Take 1 Tablet By Mouth Once A Day 4)  Enalapril Maleate 20 Mg Tabs (Enalapril Maleate) .Marland Kitchen.. 1 By Mouth Two Times A Day 5)  Folic Acid 400 Mcg Tabs (Folic Acid) .... Take 1 Once A Day 6)  Metformin Hcl 1000 Mg Tabs (Metformin Hcl) .... Take 1/2  Tablet By Mouth Two Times A Day 7)  Omega-3 1000 Mg Caps (Omega-3 Fatty Acids) .... Take 4 Once A Day 8)  Crestor 40 Mg Tabs (Rosuvastatin Calcium) .Marland Kitchen.. 1 Tablet By Mouth Daily 9)  Vitamin E 400 Unit Caps (Vitamin E) .... Take 1 Once A Day 10)  Omeprazole 20 Mg Cpdr (Omeprazole) .... One By Mouth Daily 11)   Spiriva Handihaler 18 Mcg  Caps (Tiotropium Bromide Monohydrate) .... Contents of One Capsule Inhaled Daily 12)  Metoprolol-Hydrochlorothiazide 100-25 Mg Tabs (Metoprolol-Hydrochlorothiazide) .... Take 1 Tablet By Mouth Once A Day 13)  Accu-Chek Compact Test Drum  Strp (Glucose Blood) .... Test Three Times A Day As Directed 14)  Meloxicam 7.5 Mg  Tabs (Meloxicam) .... One By Mouth Daily 15)  Lasix 20 Mg Tabs (Furosemide) .... Take 1 Tablet By Mouth Once A Day 16)  Klor-Con M20 20 Meq Cr-Tabs (Potassium Chloride Crys Cr) .... Take 1 Tablet By Mouth Once A Day 17)  Meclizine Hcl 25 Mg Tabs (Meclizine Hcl) .... Take 1 Tablet By Mouth Two Times A Day As Needed 18)  Fenofibrate Micronized 134 Mg Caps (Fenofibrate Micronized) .... Take 1 Tablet By Mouth Once A Day 19)  Amlodipine Besylate 5 Mg  Tabs (Amlodipine Besylate) .Marland Kitchen.. 1 By Mouth Every Day 20)  Glimepiride 2 Mg Tabs (Glimepiride) .Marland Kitchen.. 1 By Mouth Daily  Allergies (verified): 1)  ! Morphine Sulfate Cr (Morphine Sulfate) 2)  ! Codeine Sulfate (Codeine Sulfate)  Past History:  Past Medical History: Last updated: 03/26/2007 COPD Coronary artery disease Hypertension Hyperlipidemia Diabetes mellitus, type II  Past Surgical History: Last updated: 06/08/2009 Cholecystectomy Percutaneous transluminal coronary angioplasty Cataract extraction---left (2010, scheduled for right 11/10)  Social History: Last updated: 09/21/2007 Former Smoker---quit 3/08 Single Regular exercise-no  Risk Factors: Exercise: no (02/26/2010)  Risk Factors: Smoking Status: quit > 6 months (02/26/2010)  Physical Exam  General:  well-developed well-nourished female in no acute distress. She has marked central obesity. HEENT exam atraumatic, normocephalic, neck supple without lymphadenopathy chest her auscultation cardiac exam S1-S2 are regular. Abdominal exam overweight, to bowel sounds, soft extremities no edema.   Impression & Recommendations:  Problem #  1:  DIABETES MELLITUS, TYPE II (ICD-250.00)  Her updated medication list for this problem includes:    Aspir-81 81 Mg Tbec (Aspirin) .Marland Kitchen... Take 1 tablet by mouth once a day    Enalapril Maleate 20 Mg Tabs (Enalapril maleate) .Marland Kitchen... 1 by mouth two times a day    Metformin Hcl 1000 Mg Tabs (Metformin hcl) .Marland Kitchen... Take 1/2  tablet by mouth two times a day    Glimepiride 2 Mg Tabs (Glimepiride) .Marland Kitchen... 1 by mouth daily  Labs Reviewed: Creat: 0.8 (06/25/2010)     Last Eye Exam: normal-pt's report (09/15/2009) Reviewed HgBA1c results: 7.1 (06/25/2010)  6.9 (02/19/2010)  Problem # 2:  HYPERLIPIDEMIA (ICD-272.4)  Her updated medication list for this problem includes:    Crestor 40 Mg Tabs (Rosuvastatin calcium) .Marland Kitchen... 1 tablet by mouth daily    Fenofibrate Micronized 134 Mg Caps (Fenofibrate micronized) .Marland Kitchen... Take 1 tablet by mouth once a day  Labs Reviewed: SGOT: 21 (06/25/2010)   SGPT: 21 (06/25/2010)  Prior 10 Yr Risk Heart Disease: N/A (08/10/2007)   HDL:35.90 (06/25/2010), 39.70 (02/19/2010)  LDL:DEL (09/22/2008), DEL (05/19/2008)  Chol:166 (06/25/2010), 171 (02/19/2010)  Trig:625.0 (06/25/2010), 657.0 Triglyceride is over 400; calculations on Lipids are invalid. mg/dL (95/62/1308)  Problem # 3:  HYPERTENSION (ICD-401.9)  Her updated medication list for this problem includes:    Enalapril Maleate 20 Mg Tabs (Enalapril maleate) .Marland Kitchen... 1 by mouth two times a day    Metoprolol-hydrochlorothiazide 100-25 Mg Tabs (Metoprolol-hydrochlorothiazide) .Marland Kitchen... Take 1 tablet by mouth once a day    Lasix 20 Mg Tabs (Furosemide) .Marland Kitchen... Take 1 tablet by mouth once a day    Amlodipine Besylate 5 Mg Tabs (Amlodipine besylate) .Marland Kitchen... 1 by mouth every day  BP today: 126/72 Prior BP: 120/80 (02/26/2010)  Prior 10 Yr Risk Heart Disease: N/A (08/10/2007)  Labs Reviewed: K+: 4.3 (06/25/2010) Creat: : 0.8 (06/25/2010)   Chol: 166 (06/25/2010)   HDL: 35.90 (06/25/2010)   LDL: DEL (09/22/2008)   TG: 625.0  (06/25/2010)  Problem # 4:  CORONARY ARTERY DISEASE (ICD-414.00)  Her updated medication list for this problem includes:    Aspir-81 81 Mg Tbec (Aspirin) .Marland Kitchen... Take 1 tablet by mouth once a day    Enalapril Maleate 20 Mg Tabs (Enalapril maleate) .Marland Kitchen... 1 by mouth two times a day    Metoprolol-hydrochlorothiazide 100-25 Mg Tabs (Metoprolol-hydrochlorothiazide) .Marland Kitchen... Take 1 tablet by mouth once a day    Lasix 20 Mg Tabs (Furosemide) .Marland Kitchen... Take 1 tablet by mouth once a day    Amlodipine Besylate 5 Mg Tabs (Amlodipine besylate) .Marland Kitchen... 1 by mouth every day  Labs Reviewed: Chol: 166 (06/25/2010)   HDL: 35.90 (06/25/2010)   LDL: DEL (09/22/2008)   TG: 625.0 (06/25/2010)  Complete Medication List: 1)  Advair Diskus 100-50 Mcg/dose Misc (Fluticasone-salmeterol) .... Inhale 1 puff as directed twice a day as needed 2)  Albuterol Sulfate (2.5 Mg/70ml) 0.083% Nebu (Albuterol sulfate) .... Inhale 1 vial using nebulizer four times a day as needed 3)  Aspir-81 81 Mg Tbec (Aspirin) .... Take  1 tablet by mouth once a day 4)  Enalapril Maleate 20 Mg Tabs (Enalapril maleate) .Marland Kitchen.. 1 by mouth two times a day 5)  Folic Acid 400 Mcg Tabs (Folic acid) .... Take 1 once a day 6)  Metformin Hcl 1000 Mg Tabs (Metformin hcl) .... Take 1/2  tablet by mouth two times a day 7)  Omega-3 1000 Mg Caps (Omega-3 fatty acids) .... Take 4 once a day 8)  Crestor 40 Mg Tabs (Rosuvastatin calcium) .Marland Kitchen.. 1 tablet by mouth daily 9)  Vitamin E 400 Unit Caps (Vitamin e) .... Take 1 once a day 10)  Omeprazole 20 Mg Cpdr (Omeprazole) .... One by mouth daily 11)  Spiriva Handihaler 18 Mcg Caps (Tiotropium bromide monohydrate) .... Contents of one capsule inhaled daily 12)  Metoprolol-hydrochlorothiazide 100-25 Mg Tabs (Metoprolol-hydrochlorothiazide) .... Take 1 tablet by mouth once a day 13)  Accu-chek Compact Test Drum Strp (Glucose blood) .... Test three times a day as directed 14)  Meloxicam 7.5 Mg Tabs (Meloxicam) .... One by mouth  daily 15)  Lasix 20 Mg Tabs (Furosemide) .... Take 1 tablet by mouth once a day 16)  Klor-con M20 20 Meq Cr-tabs (Potassium chloride crys cr) .... Take 1 tablet by mouth once a day 17)  Meclizine Hcl 25 Mg Tabs (Meclizine hcl) .... Take 1 tablet by mouth two times a day as needed 18)  Fenofibrate Micronized 134 Mg Caps (Fenofibrate micronized) .... Take 1 tablet by mouth once a day 19)  Amlodipine Besylate 5 Mg Tabs (Amlodipine besylate) .Marland Kitchen.. 1 by mouth every day 20)  Glimepiride 2 Mg Tabs (Glimepiride) .Marland Kitchen.. 1 by mouth daily 21)  Accu-chek Compact Test Drum Strp (Glucose blood)  Patient Instructions: 1)  Please schedule a follow-up appointment in 4 months. 2)  labs one week prior to visit 3)  lipids---272.4 4)  lfts-995.2 5)  bmet-995.2 6)  A1C-250.02 7)       Orders Added: 1)  Est. Patient Level IV [11914]

## 2010-08-29 ENCOUNTER — Other Ambulatory Visit: Payer: Self-pay | Admitting: Internal Medicine

## 2010-09-03 ENCOUNTER — Other Ambulatory Visit: Payer: Self-pay | Admitting: Internal Medicine

## 2010-10-11 ENCOUNTER — Other Ambulatory Visit: Payer: Self-pay | Admitting: Internal Medicine

## 2010-10-12 ENCOUNTER — Other Ambulatory Visit: Payer: Self-pay | Admitting: Internal Medicine

## 2010-10-25 ENCOUNTER — Other Ambulatory Visit (INDEPENDENT_AMBULATORY_CARE_PROVIDER_SITE_OTHER): Payer: Medicare PPO | Admitting: Internal Medicine

## 2010-10-25 DIAGNOSIS — E785 Hyperlipidemia, unspecified: Secondary | ICD-10-CM

## 2010-10-25 DIAGNOSIS — T887XXA Unspecified adverse effect of drug or medicament, initial encounter: Secondary | ICD-10-CM

## 2010-10-25 LAB — HEPATIC FUNCTION PANEL
ALT: 23 U/L (ref 0–35)
AST: 24 U/L (ref 0–37)
Albumin: 4 g/dL (ref 3.5–5.2)
Alkaline Phosphatase: 58 U/L (ref 39–117)
Total Protein: 7 g/dL (ref 6.0–8.3)

## 2010-10-25 LAB — HEMOGLOBIN A1C: Hgb A1c MFr Bld: 7.7 % — ABNORMAL HIGH (ref 4.6–6.5)

## 2010-10-25 LAB — LDL CHOLESTEROL, DIRECT: Direct LDL: 34.9 mg/dL

## 2010-10-25 LAB — BASIC METABOLIC PANEL
BUN: 24 mg/dL — ABNORMAL HIGH (ref 6–23)
CO2: 25 mEq/L (ref 19–32)
Glucose, Bld: 172 mg/dL — ABNORMAL HIGH (ref 70–99)
Potassium: 4 mEq/L (ref 3.5–5.1)
Sodium: 135 mEq/L (ref 135–145)

## 2010-10-28 ENCOUNTER — Encounter: Payer: Self-pay | Admitting: Internal Medicine

## 2010-11-01 ENCOUNTER — Ambulatory Visit (INDEPENDENT_AMBULATORY_CARE_PROVIDER_SITE_OTHER): Payer: Medicare PPO | Admitting: Internal Medicine

## 2010-11-01 ENCOUNTER — Encounter: Payer: Self-pay | Admitting: Internal Medicine

## 2010-11-01 DIAGNOSIS — E785 Hyperlipidemia, unspecified: Secondary | ICD-10-CM

## 2010-11-01 DIAGNOSIS — I10 Essential (primary) hypertension: Secondary | ICD-10-CM

## 2010-11-01 DIAGNOSIS — E119 Type 2 diabetes mellitus without complications: Secondary | ICD-10-CM

## 2010-11-01 DIAGNOSIS — J449 Chronic obstructive pulmonary disease, unspecified: Secondary | ICD-10-CM

## 2010-11-01 DIAGNOSIS — I2510 Atherosclerotic heart disease of native coronary artery without angina pectoris: Secondary | ICD-10-CM

## 2010-11-01 MED ORDER — TRAZODONE HCL 50 MG PO TABS: ORAL_TABLET | ORAL | Status: DC

## 2010-11-01 NOTE — Assessment & Plan Note (Signed)
No sxs Tolerating meds Risk factor modification---continue

## 2010-11-01 NOTE — Assessment & Plan Note (Signed)
Not as well controlled Advised aggressive weight loss

## 2010-11-01 NOTE — Assessment & Plan Note (Signed)
She has not smoked in 5 years

## 2010-11-01 NOTE — Assessment & Plan Note (Signed)
Controlled Continue curent meds  

## 2010-11-01 NOTE — Assessment & Plan Note (Signed)
Note TG---advised weight loss

## 2010-11-01 NOTE — Progress Notes (Signed)
  Subjective:    Patient ID: Michelle [name], female    DOB: 1933/06/21, 77 y.o.   MRN: [id number]  HPI  patient comes in for followup of multiple medical problems including type 2 diabetes, hyperlipidemia, hypertension. The patient does check  blood pressure at home. The patetient does not follow an exercise or diet program. The patient denies any polyuria, polydipsia.  In the past the patient has gone to diabetic treatment center. The patient is tolerating medications  Without difficulty. The patient does admit to medication compliance. Home CBGs 100-130. Home bps as low 99/50. Typical BP in the 120s/70s  She has trouble wit insomnia---admits to some daytime sleeping   Past Medical History  Diagnosis Date  . COPD (chronic obstructive pulmonary disease)   . CAD (coronary artery disease)   . Hyperlipidemia   . Hypertension   . Diabetes mellitus    Past Surgical History  Procedure Date  . Cholecystectomy   . Cataract extraction 2010  . Ptca     reports that she has quit smoking. She does not have any smokeless tobacco history on file. Her alcohol and drug histories not on file. family history is not on file. Allergies  Allergen Reactions  . Codeine Sulfate     REACTION: nausea  . Morphine Sulfate     REACTION: nausea      Review of Systems  patient denies chest pain, shortness of breath, orthopnea. Denies lower extremity edema, abdominal pain, change in appetite, change in bowel movements. Patient denies rashes No other specific complaints in a complete review of systems except ongoing aches and pains she relates to "arthritis"      Objective:   Physical Exam  Well-developed well-nourished female in no acute distress. HEENT exam atraumatic, normocephalic, extraocular muscles are intact. Neck is supple. No jugular venous distention no thyromegaly. Chest clear to auscultation without increased work of breathing. Cardiac exam S1 and S2 are regular. Abdominal exam active bowel  sounds, soft, nontender, overweight. Extremities no edema. Neurologic exam she is alert without any motor sensory deficits. Gait is normal.      Assessment & Plan:

## 2010-11-05 ENCOUNTER — Other Ambulatory Visit: Payer: Self-pay | Admitting: Internal Medicine

## 2010-11-13 ENCOUNTER — Other Ambulatory Visit: Payer: Self-pay | Admitting: Internal Medicine

## 2010-11-15 ENCOUNTER — Other Ambulatory Visit: Payer: Self-pay | Admitting: Internal Medicine

## 2010-12-03 NOTE — Discharge Summary (Signed)
NAME:  Leanette, Eutsler Shevawn A                         ACCOUNT NO.:  [id number]   MEDICAL RECORD NO.:  [id number]                   PATIENT TYPE:  INP   LOCATION:  5708                                 FACILITY:  [facility]   PHYSICIAN:  Rene Paci, M.D. [facility]          DATE OF BIRTH:  03/30/1933   DATE OF ADMISSION:  07/15/2002  DATE OF DISCHARGE:  07/19/2002                                 DISCHARGE SUMMARY   DISCHARGE DIAGNOSES:  1. Acute respiratory insufficiency.  2. Acute exacerbation chronic obstructive pulmonary disease.  3. Bilateral pneumonia.  4. Pulmonary nodule.  5. Leukocytosis.  6. Liver lesion.  7. Hyponatremia.  8. Hypokalemia.   BRIEF ADMISSION HISTORY:  Ms. Dickard is a 77 year old white female who  presented with a five day history of sinus congestion.  She complained of a  nonproductive cough, shortness of breath, wheeze.  The patient had been seen  two days prior to admission by Dr. Milinda Antis refusing hospitalization at that  time.  She was treated with four _____ and steroids, Zithromax and  nebulizers.   PAST MEDICAL HISTORY:  1. Tobacco abuse.  2. Coronary artery disease.  3. Marked hyperlipidemia.  4. Hypertension.  5. Status post cholecystectomy.   HOSPITAL COURSE:  Problem 1.  PULMONARY.  The patient presented with  respiratory insufficiency that was secondary to acute exacerbation of COPD  and bilateral pneumonia.  The patient was started on oxygen nebulizers and  antibiotics.  Additionally, we added steroids.  The patient's condition  slowly improved to the point she was felt stable for discharge.  Chest x-ray  did reveal a pulmonary nodule.  This was followed up with a CT of the chest  that revealed extensive bilateral infiltrates with a 1.3 cm nodule in the  right apex and a 2 cm enhancing liver lesion.  Dr. Cato Mulligan discussed the  findings with the patient and that she would need outpatient follow up once  her pulmonary status was stable.   Problem 2.   HYPOKALEMIA AND HYPONATREMIA SECONDARY TO HCTZ:  This was held  during hospitalization and corrected with fluids.   LABORATORY DATA:  Potassium 3.4, BUN 13, creatinine 0.7.  CT of the liver is  still pending.   DISCHARGE MEDICATIONS:  1. Septra 250 mg b.i.d. for seven days.  2. Prednisone 20 mg taper.  3. Adalat CC 90 mg daily.  4. Lopressor/Hydrochlorothiazide 100/25 daily.  5. Folic acid 1 mg daily.  6. Vitamin E daily.  7. Aspirin daily.  8. Prevacid 30 mg daily.  9. Lipitor 40 mg daily.  10.      TriCor 150 mg daily.  11.      Albuterol and Atrovent nebulized q.i.d.   FOLLOW UP:  With Dr. Cato Mulligan in the next week.     Cornell Barman, P.A. [facility]                  Rene Paci, M.D.  [facility]    LC/MEDQ  D:  08/15/2002  T:  08/15/2002  Job:  [id number]

## 2010-12-03 NOTE — Assessment & Plan Note (Signed)
[facility]                                 ON-CALL NOTE   NAME:Michelle [name], Michelle [name]                      MRN:          [id number]  DATE:09/25/2006                            DOB:          [birth year]    Phone call comes from her son, Jalayia Bagheri, at [phone].  Patient of Dr.  Cato Mulligan.  Phone call came at 6:03 p.m. on March 10.   Mr. Bernardy calls about his mom who has had some kind of sinus thing going  on for about [name] month.  I accessed her medication record, and she did get  an antibiotic [name] month ago.  She is now having bad headaches.  She  relates it to her left sinus continuing problems, some cough, really not  an acute thing with severe illness or fever, but he had called about  3:30 and was expecting [name] call back.  I explained about the problems with  the 3:30 on Monday call and explained that we would want to make sure  that she had [name] call back, but that is [name] tough situation.  They were  expecting either an antibiotic to be called in or to get an appointment  to be seen.   PLAN:  I told them that no prescription had been done and they had  checked with the pharmacy to know that.  I asked them to call at 8:30  tomorrow morning to set up an appointment or to see if Dr. Cato Mulligan just  wanted to treat her, and he was okay with that, although [name] little  frustrated that he did not get [name] call back.   Note to Dr. Cato Mulligan:  I do think we have to make sure our Triage folks do  not give unrealistic expectations at 3:30 p.m. on Monday as this is [name]  busy day, and they may not get an answer back and people need to know  that if it is not an emergency.     Karie Schwalbe, MD  Electronically Signed    RIL/MedQ  DD: 09/25/2006  DT: 09/26/2006  Job #: [id number]   cc:   Valetta Mole. Swords, MD

## 2010-12-03 NOTE — H&P (Signed)
NAME:  Michelle [name], Michelle [name] NO.:  [id number]   MEDICAL RECORD NO.:  [id number]                   PATIENT TYPE:   LOCATION:                                       FACILITY:   PHYSICIAN:  Valetta Mole. Swords, M.D. [facility]           DATE OF BIRTH:  [birth year]   DATE OF ADMISSION:  07/15/2002  DATE OF DISCHARGE:                                HISTORY & PHYSICAL   HISTORY OF PRESENT ILLNESS:  The patient is a 77 year old female who reports  a five day history of an illness.  She states symptoms started with sinus  congestion and symptoms typical of a cold.  She states that the symptoms  have progressed now with a nonproductive cough, shortness of breath, wheeze.  She admits to some shortness of breath at rest and dyspnea on exertion.  She  saw Dr. Milinda Antis two days ago, refused hospitalization at that time.  She was  treated for influenza, steroids, Zithromax and nebulizers.  She has failed  to improve and is admitted for further evaluation of respiratory failure.   PAST MEDICAL HISTORY:  Significant for tobacco abuse, coronary artery  disease, marked hyperlipidemia and hypertension.   PAST SURGICAL HISTORY:  Cholecystectomy.   MEDICATIONS:  1. Lipitor 80 mg p.o. daily.  2. Fish oil q. daily.  3. Lopressor/HCTZ 100/25 one p.o. daily.  4. Folic acid 1 mg p.o. daily.  5. Vitamin E.  6. Adalat CC 90 mg p.o. daily.  7. TriCor 150 mg p.o. daily.  8. Aspirin 81 mg daily.  9. Protonix 40 mg p.o. daily.   ALLERGIES:  Codeine and morphine cause nausea.   FAMILY HISTORY:  Father deceased of unknown cause at age 71.  Mother  deceased with heart trouble at 60.  Brother deceased with liver cancer at  29.  One brother living.   SOCIAL HISTORY:  She is not married.  Has two healthy sons.  She is a smoker  and does not drink alcohol.   REVIEW OF SYSTEMS:  She admits to fatigue, minor headache.  She admits to  shortness of breath.  She specifically denies chest pain, PND,  orthopnea.  Cough is nonproductive.  She has occasional heartburn.  She denies any other  complaints in the review of systems.   PHYSICAL EXAMINATION:   VITAL SIGNS:  Weight 170, temperature 97.7, pulse 98, respirations 32, blood  pressure 132/60.   GENERAL APPEARANCE:  She appears as an elderly female in moderate  respiratory distress.   HEENT:  Normocephalic, atraumatic.  Extraocular muscles intact.   NECK:  Supple without lymphadenopathy, thyromegaly, jugular venous  distention or carotid bruits.   CHEST:  There is some increased work of breathing.  She has bilateral  wheezing.  No dullness to percussion.   CARDIOVASCULAR:  S1, S2, regular rate.  Unable to hear other heart sounds  secondary to respiratory sounds.   ABDOMEN:  Overweight,  active bowel sounds, soft, nontender.  There is no  hepatosplenomegaly.  No masses are palpated.   EXTREMITIES:  No cyanosis, clubbing or edema.   NEUROLOGIC:  Alert and oriented without any motor or sensory deficits.  There are no rashes identified.   BREAST:  Deferred.   PELVIC:  Deferred.   ASSESSMENT/PLAN:  1. Respiratory failure.  Pulse oximetry in the office 83-84% initially.     Increased to 92% on 2 liters of oxygen.  Unclear if this is just an     exacerbation of chronic obstructive pulmonary disease or whether she has     superimposed bronchitis or pneumonia.  Will treat aggressively with     Rocephin, Zithromax, oxygen, steroids and nebulizers.  2. History of coronary artery disease.  Will check an EKG and cardiac     enzymes.  3. Hyperlipidemia.  Continue current medications.  4. Tobacco abuse.  Have urged her to quit.                                               Bruce Rexene Edison Swords, M.D. [facility]    BHS/MEDQ  D:  07/15/2002  T:  07/15/2002  Job:  [id number]

## 2010-12-03 NOTE — Assessment & Plan Note (Signed)
[facility]                                   ON-CALL NOTE   NAME:[name], [name]                      MRN:          [id number]  DATE:02/25/2006                            DOB:          Nov 06, 1932    PHONE NUMBER:  [phone].   PRIMARY CARE PHYSICIAN:  Dr.  Cato Mulligan.   TIME OF PHONE CALL:  About 10:30 a.m.   HISTORY OF PRESENT ILLNESS:  She has been diagnosed with COPD; about 1 month  ago.  She was seen in urgent care and then by Dr. Kirtland Bouchard, covering for Dr.  Cato Mulligan, recently; given steroids and antibiotics apparently.  She woke up  this morning and was having significant trouble breathing, bad cough, and  really felt out of sorts.   PLAN:  Given the degree of the shortness of breath she describes, I felt  that emergency room evaluation would be appropriate.  She did not seem happy  with that, but given the lack of x-ray facilities here and a lot of  treatment options, I felt that the emergency room made most sense.                                   Karie Schwalbe, MD   RIL/MedQ  DD:  02/25/2006  DT:  02/26/2006  Job #:  [id number]   cc:   Valetta Mole. Swords, MD

## 2011-01-15 ENCOUNTER — Other Ambulatory Visit: Payer: Self-pay | Admitting: Internal Medicine

## 2011-01-17 ENCOUNTER — Other Ambulatory Visit: Payer: Self-pay | Admitting: *Deleted

## 2011-02-01 ENCOUNTER — Other Ambulatory Visit: Payer: Self-pay | Admitting: Internal Medicine

## 2011-02-02 ENCOUNTER — Other Ambulatory Visit: Payer: Self-pay | Admitting: Internal Medicine

## 2011-02-08 ENCOUNTER — Other Ambulatory Visit: Payer: Self-pay | Admitting: Internal Medicine

## 2011-02-14 ENCOUNTER — Other Ambulatory Visit: Payer: Self-pay | Admitting: Internal Medicine

## 2011-02-28 ENCOUNTER — Other Ambulatory Visit: Payer: Self-pay | Admitting: Internal Medicine

## 2011-02-28 ENCOUNTER — Other Ambulatory Visit (INDEPENDENT_AMBULATORY_CARE_PROVIDER_SITE_OTHER): Payer: Medicare PPO

## 2011-02-28 DIAGNOSIS — E785 Hyperlipidemia, unspecified: Secondary | ICD-10-CM

## 2011-02-28 DIAGNOSIS — Z79899 Other long term (current) drug therapy: Secondary | ICD-10-CM

## 2011-02-28 DIAGNOSIS — I2510 Atherosclerotic heart disease of native coronary artery without angina pectoris: Secondary | ICD-10-CM

## 2011-02-28 DIAGNOSIS — E119 Type 2 diabetes mellitus without complications: Secondary | ICD-10-CM

## 2011-02-28 LAB — CBC WITH DIFFERENTIAL/PLATELET
Basophils Relative: 0.8 % (ref 0.0–3.0)
Eosinophils Relative: 2.3 % (ref 0.0–5.0)
MCV: 84.2 fl (ref 78.0–100.0)
Monocytes Relative: 11.7 % (ref 3.0–12.0)
Neutrophils Relative %: 42.4 % — ABNORMAL LOW (ref 43.0–77.0)
Platelets: 161 10*3/uL (ref 150.0–400.0)
RBC: 4.63 Mil/uL (ref 3.87–5.11)
WBC: 5.9 10*3/uL (ref 4.5–10.5)

## 2011-02-28 LAB — LIPID PANEL
Cholesterol: 176 mg/dL (ref 0–200)
Total CHOL/HDL Ratio: 4
Triglycerides: 746 mg/dL — ABNORMAL HIGH (ref 0.0–149.0)
VLDL: 149.2 mg/dL — ABNORMAL HIGH (ref 0.0–40.0)

## 2011-02-28 LAB — BASIC METABOLIC PANEL
BUN: 24 mg/dL — ABNORMAL HIGH (ref 6–23)
Creatinine, Ser: 1 mg/dL (ref 0.4–1.2)
GFR: 57.94 mL/min — ABNORMAL LOW (ref >60.00)

## 2011-02-28 LAB — HEPATIC FUNCTION PANEL: Total Bilirubin: 0.3 mg/dL (ref 0.3–1.2)

## 2011-02-28 LAB — LDL CHOLESTEROL, DIRECT: Direct LDL: 43.7 mg/dL

## 2011-02-28 LAB — HEMOGLOBIN A1C: Hgb A1c MFr Bld: 7.6 % — ABNORMAL HIGH (ref 4.6–6.5)

## 2011-03-03 ENCOUNTER — Other Ambulatory Visit: Payer: Self-pay | Admitting: Internal Medicine

## 2011-03-07 ENCOUNTER — Ambulatory Visit (INDEPENDENT_AMBULATORY_CARE_PROVIDER_SITE_OTHER): Payer: Medicare PPO | Admitting: Internal Medicine

## 2011-03-07 ENCOUNTER — Encounter: Payer: Self-pay | Admitting: Internal Medicine

## 2011-03-07 DIAGNOSIS — I10 Essential (primary) hypertension: Secondary | ICD-10-CM

## 2011-03-07 DIAGNOSIS — E119 Type 2 diabetes mellitus without complications: Secondary | ICD-10-CM

## 2011-03-07 DIAGNOSIS — I2510 Atherosclerotic heart disease of native coronary artery without angina pectoris: Secondary | ICD-10-CM

## 2011-03-07 DIAGNOSIS — E785 Hyperlipidemia, unspecified: Secondary | ICD-10-CM

## 2011-03-07 MED ORDER — NORTRIPTYLINE HCL 10 MG PO CAPS: 10.0000 mg | ORAL_CAPSULE | Freq: Every day | ORAL | Status: DC

## 2011-03-07 NOTE — Patient Instructions (Signed)
Call your insurance company and see if they cover shingles immunization

## 2011-03-07 NOTE — Assessment & Plan Note (Signed)
Lab Results  Component Value Date   HGBA1C 7.6* 02/28/2011   i'd like her to address this with diet and exercise as opposed to additional meds

## 2011-03-07 NOTE — Assessment & Plan Note (Signed)
TG are too high Discussed need for agessive weight loss and low fat diet. She should concentrate on minimal carbohydrates

## 2011-03-07 NOTE — Assessment & Plan Note (Signed)
No sxs Continue current meds 

## 2011-03-07 NOTE — Progress Notes (Signed)
  Subjective:    Patient ID: Michelle [name], female    DOB: 08-24-1932, 77 y.o.   MRN: [id number]  HPI   patient comes in for followup of multiple medical problems including type 2 diabetes, hyperlipidemia, hypertension. The patient does not check blood sugar or blood pressure at home. The patetient does not follow an exercise or diet program. The patient denies any polyuria, polydipsia.  In the past the patient has gone to diabetic treatment center. The patient is tolerating medications  Without difficulty. The patient does admit to medication compliance.   Past Medical History  Diagnosis Date  . COPD (chronic obstructive pulmonary disease)   . CAD (coronary artery disease)   . Hyperlipidemia   . Hypertension   . Diabetes mellitus    Past Surgical History  Procedure Date  . Cholecystectomy   . Cataract extraction 2010  . Ptca     reports that she has quit smoking. She does not have any smokeless tobacco history on file. Her alcohol and drug histories not on file. family history is not on file. Allergies  Allergen Reactions  . Codeine Sulfate     REACTION: nausea  . Morphine Sulfate     REACTION: nausea    Review of Systems  patient denies chest pain, shortness of breath, orthopnea. Denies lower extremity edema, abdominal pain, change in appetite, change in bowel movements. Patient denies rashes, musculoskeletal complaints. No other specific complaints in a complete review of systems.      Objective:   Physical Exam  Well-developed well-nourished female in no acute distress. HEENT exam atraumatic, normocephalic, extraocular muscles are intact. Neck is supple. No jugular venous distention no thyromegaly. Chest clear to auscultation without increased work of breathing. Cardiac exam S1 and S2 are regular. Abdominal exam active bowel sounds, soft, nontender. Extremities no edema.        Assessment & Plan:  Insomnia---d/c trazadone--add pamelor

## 2011-03-07 NOTE — Assessment & Plan Note (Signed)
BP Readings from Last 3 Encounters:  03/07/11 134/76  11/01/10 132/80  08/13/10 142/80  fair control Continue meds

## 2011-03-18 ENCOUNTER — Other Ambulatory Visit: Payer: Self-pay | Admitting: Internal Medicine

## 2011-04-07 ENCOUNTER — Other Ambulatory Visit: Payer: Self-pay | Admitting: Internal Medicine

## 2011-04-14 ENCOUNTER — Other Ambulatory Visit: Payer: Self-pay | Admitting: Internal Medicine

## 2011-05-03 ENCOUNTER — Other Ambulatory Visit: Payer: Self-pay | Admitting: Internal Medicine

## 2011-05-04 ENCOUNTER — Other Ambulatory Visit: Payer: Self-pay | Admitting: Internal Medicine

## 2011-05-16 ENCOUNTER — Other Ambulatory Visit: Payer: Self-pay | Admitting: Internal Medicine

## 2011-05-23 ENCOUNTER — Other Ambulatory Visit: Payer: Self-pay | Admitting: Internal Medicine

## 2011-07-08 ENCOUNTER — Other Ambulatory Visit: Payer: Self-pay | Admitting: Internal Medicine

## 2011-07-08 ENCOUNTER — Other Ambulatory Visit (INDEPENDENT_AMBULATORY_CARE_PROVIDER_SITE_OTHER): Payer: Medicare PPO

## 2011-07-08 DIAGNOSIS — E119 Type 2 diabetes mellitus without complications: Secondary | ICD-10-CM

## 2011-07-08 LAB — LIPID PANEL
Cholesterol: 167 mg/dL (ref 0–200)
HDL: 40.8 mg/dL (ref >39.00)
VLDL: 140.6 mg/dL — ABNORMAL HIGH (ref 0.0–40.0)

## 2011-07-08 LAB — BASIC METABOLIC PANEL
Chloride: 100 mEq/L (ref 96–112)
GFR: 57.22 mL/min — ABNORMAL LOW (ref >60.00)
Potassium: 4.1 mEq/L (ref 3.5–5.1)
Sodium: 138 mEq/L (ref 135–145)

## 2011-07-08 LAB — HEMOGLOBIN A1C: Hgb A1c MFr Bld: 8 % — ABNORMAL HIGH (ref 4.6–6.5)

## 2011-07-08 LAB — HEPATIC FUNCTION PANEL
ALT: 26 U/L (ref 0–35)
Albumin: 4.2 g/dL (ref 3.5–5.2)
Bilirubin, Direct: 0 mg/dL (ref 0.0–0.3)
Total Protein: 7.4 g/dL (ref 6.0–8.3)

## 2011-07-13 ENCOUNTER — Other Ambulatory Visit: Payer: Self-pay | Admitting: Internal Medicine

## 2011-07-15 ENCOUNTER — Ambulatory Visit: Payer: Medicare PPO | Admitting: Internal Medicine

## 2011-07-29 ENCOUNTER — Ambulatory Visit (INDEPENDENT_AMBULATORY_CARE_PROVIDER_SITE_OTHER): Payer: Medicare Other | Admitting: Internal Medicine

## 2011-07-29 DIAGNOSIS — I10 Essential (primary) hypertension: Secondary | ICD-10-CM

## 2011-07-29 DIAGNOSIS — Z23 Encounter for immunization: Secondary | ICD-10-CM

## 2011-07-29 DIAGNOSIS — E119 Type 2 diabetes mellitus without complications: Secondary | ICD-10-CM

## 2011-07-29 DIAGNOSIS — I2510 Atherosclerotic heart disease of native coronary artery without angina pectoris: Secondary | ICD-10-CM

## 2011-07-29 MED ORDER — ALBUTEROL SULFATE HFA 108 (90 BASE) MCG/ACT IN AERS: 2.0000 | INHALATION_SPRAY | Freq: Four times a day (QID) | RESPIRATORY_TRACT | Status: DC | PRN

## 2011-07-29 MED ORDER — GLIMEPIRIDE 2 MG PO TABS: 2.0000 mg | ORAL_TABLET | Freq: Two times a day (BID) | ORAL | Status: DC

## 2011-07-29 MED ORDER — AMITRIPTYLINE HCL 25 MG PO TABS: 25.0000 mg | ORAL_TABLET | Freq: Every evening | ORAL | Status: DC | PRN

## 2011-07-29 NOTE — Progress Notes (Signed)
Patient ID: Michelle [name], female   DOB: 09/04/1932, 78 y.o.   MRN: [id number]  patient comes in for followup of multiple medical problems including type 2 diabetes, hyperlipidemia, hypertension. The patient does not check blood sugar or blood pressure at home. The patetient does not follow an exercise or diet program. The patient denies any polyuria, polydipsia.  In the past the patient has gone to diabetic treatment center. The patient is tolerating medications  Without difficulty. The patient does admit to medication compliance.   Past Medical History  Diagnosis Date  . COPD (chronic obstructive pulmonary disease)   . CAD (coronary artery disease)   . Hyperlipidemia   . Hypertension   . Diabetes mellitus     History   Social History  . Marital Status: Married    Spouse Name: N/A    Number of Children: N/A  . Years of Education: N/A   Occupational History  . Not on file.   Social History Main Topics  . Smoking status: Former Games developer  . Smokeless tobacco: Not on file  . Alcohol Use:   . Drug Use:   . Sexually Active:    Other Topics Concern  . Not on file   Social History Narrative  . No narrative on file    Past Surgical History  Procedure Date  . Cholecystectomy   . Cataract extraction 2010  . Ptca     No family history on file.  Allergies  Allergen Reactions  . Codeine Sulfate     REACTION: nausea  . Morphine Sulfate     REACTION: nausea    Current Outpatient Prescriptions on File Prior to Visit  Medication Sig Dispense Refill  . ADVAIR DISKUS 100-50 MCG/DOSE AEPB INHALE 1 PUFF AS DIRECTED TWICE A DAY AS NEEDED  60 each  3  . albuterol (PROVENTIL HFA) 108 (90 BASE) MCG/ACT inhaler Inhale 2 puffs into the lungs every 6 (six) hours as needed.        Marland Kitchen albuterol (PROVENTIL) (2.5 MG/3ML) 0.083% nebulizer solution INHALE 1 VIAL USING NEBULIZER FOUR TIMES A DAY AS NEEDED  150 mL  3  . amLODipine (NORVASC) 5 MG tablet Take 5 mg by mouth daily.        Marland Kitchen aspirin  81 MG tablet Take 81 mg by mouth daily.        . enalapril (VASOTEC) 20 MG tablet TAKE ONE TABLET BY MOUTH TWICE DAILY  180 tablet  0  . fenofibrate micronized (LOFIBRA) 134 MG capsule TAKE ONE CAPSULE BY MOUTH DAILY  30 capsule  3  . fish oil-omega-3 fatty acids 1000 MG capsule Take 2 g by mouth daily.        . folic acid (FOLVITE) 400 MCG tablet Take 400 mcg by mouth daily.        . furosemide (LASIX) 20 MG tablet TAKE 1 TABLET BY MOUTH ONCE A DAY  90 tablet  2  . glimepiride (AMARYL) 2 MG tablet TAKE ONE TABLET BY MOUTH DAILY  30 tablet  4  . glucose blood (ACCU-CHEK COMPACT TEST DRUM) test strip 1 each by Other route 3 (three) times daily. Use as instructed       . meclizine (ANTIVERT) 25 MG tablet Take 25 mg by mouth 2 (two) times daily.        . meloxicam (MOBIC) 7.5 MG tablet TAKE ONE TABLET BY MOUTH DAILY  30 tablet  4  . metFORMIN (GLUCOPHAGE) 1000 MG tablet Take 1,000 mg by mouth 2 (  two) times daily with a meal.       . metoprolol-hydrochlorothiazide (LOPRESSOR HCT) 100-25 MG per tablet TAKE 1 TABLET BY MOUTH ONCE A DAY  90 tablet  5  . omeprazole (PRILOSEC) 20 MG capsule Take 20 mg by mouth daily.        . potassium chloride SA (K-DUR,KLOR-CON) 20 MEQ tablet TAKE 1 TABLET BY MOUTH ONCE A DAY  90 tablet  1  . rosuvastatin (CRESTOR) 40 MG tablet Take 40 mg by mouth daily.        Marland Kitchen SPIRIVA HANDIHALER 18 MCG inhalation capsule INHALE CONTENTS OF ONE CAPSULE DAILY  30 capsule  5  . vitamin E 400 UNIT capsule Take 400 Units by mouth daily.           patient denies chest pain, shortness of breath, orthopnea. Denies lower extremity edema, abdominal pain, change in appetite, change in bowel movements. Patient denies rashes, musculoskeletal complaints. No other specific complaints in a complete review of systems.   BP 142/70  Pulse 96  Temp(Src) 98.1 F (36.7 C) (Oral)  Ht 5' 4.5" (1.638 m)  Wt 200 lb (90.719 kg)  BMI 33.80 kg/m2  Well-developed well-nourished female in no acute  distress. HEENT exam atraumatic, normocephalic, extraocular muscles are intact. Neck is supple. No jugular venous distention no thyromegaly. Chest clear to auscultation without increased work of breathing. Cardiac exam S1 and S2 are regular. Abdominal exam active bowel sounds, soft, nontender. Extremities no edema. Neurologic exam she is alert without any motor sensory deficits. Using a cane

## 2011-07-29 NOTE — Assessment & Plan Note (Signed)
Fair control given obesity Discussed need for weight loss Continue current meds

## 2011-07-29 NOTE — Assessment & Plan Note (Signed)
No sxs Risk factor modification is key She desperately needs to lose weight and start taking care of herself: daily exercise and low calorie diet

## 2011-07-29 NOTE — Patient Instructions (Signed)
Call your insurance company and see if they will cover shingles vaccine. If they will, call us and we will give it to you  

## 2011-07-29 NOTE — Assessment & Plan Note (Signed)
Poor control Suspect TG are somewhat related to poorly controlled DM Check labs today

## 2011-08-03 ENCOUNTER — Other Ambulatory Visit: Payer: Self-pay | Admitting: Internal Medicine

## 2011-08-03 ENCOUNTER — Telehealth: Payer: Self-pay | Admitting: Internal Medicine

## 2011-08-03 NOTE — Telephone Encounter (Signed)
Pt would like a rx for the [place] vax sent to [facility] on [address]. Pt would like for the nurse to call her when rx is sent through. Thanks. She stated that it is cheaper to go to pharmacy.

## 2011-08-04 MED ORDER — ZOSTER VACCINE LIVE 19400 UNT/0.65ML ~~LOC~~ SOLR: 0.6500 mL | Freq: Once | SUBCUTANEOUS | Status: AC

## 2011-08-04 NOTE — Telephone Encounter (Signed)
rx sent in electronically 

## 2011-08-11 ENCOUNTER — Other Ambulatory Visit: Payer: Self-pay | Admitting: Internal Medicine

## 2011-08-12 ENCOUNTER — Other Ambulatory Visit: Payer: Self-pay | Admitting: *Deleted

## 2011-08-12 MED ORDER — OMEPRAZOLE 20 MG PO CPDR: 20.0000 mg | DELAYED_RELEASE_CAPSULE | Freq: Every day | ORAL | Status: DC

## 2011-08-15 ENCOUNTER — Other Ambulatory Visit: Payer: Self-pay | Admitting: Internal Medicine

## 2011-08-25 ENCOUNTER — Other Ambulatory Visit: Payer: Self-pay | Admitting: Internal Medicine

## 2011-09-22 ENCOUNTER — Other Ambulatory Visit: Payer: Self-pay | Admitting: Internal Medicine

## 2011-10-11 ENCOUNTER — Encounter: Payer: Self-pay | Admitting: Internal Medicine

## 2011-10-11 ENCOUNTER — Telehealth: Payer: Self-pay

## 2011-10-11 ENCOUNTER — Ambulatory Visit (INDEPENDENT_AMBULATORY_CARE_PROVIDER_SITE_OTHER): Payer: Medicare Other | Admitting: Internal Medicine

## 2011-10-11 VITALS — BP 150/80 | Temp 98.1°F | Resp 22 | Wt 202.0 lb

## 2011-10-11 DIAGNOSIS — J449 Chronic obstructive pulmonary disease, unspecified: Secondary | ICD-10-CM

## 2011-10-11 DIAGNOSIS — I10 Essential (primary) hypertension: Secondary | ICD-10-CM

## 2011-10-11 DIAGNOSIS — J4489 Other specified chronic obstructive pulmonary disease: Secondary | ICD-10-CM

## 2011-10-11 MED ORDER — AMLODIPINE BESYLATE 10 MG PO TABS: 10.0000 mg | ORAL_TABLET | Freq: Every day | ORAL | Status: DC

## 2011-10-11 MED ORDER — FLUTICASONE-SALMETEROL 250-50 MCG/DOSE IN AEPB: 1.0000 | INHALATION_SPRAY | Freq: Two times a day (BID) | RESPIRATORY_TRACT | Status: DC

## 2011-10-11 NOTE — Telephone Encounter (Signed)
Pt's son called to schedule appointment for pt because she has been experiencing high BP readings and trouble breathing. Son requesting appointment after 12. Appointment scheduled

## 2011-10-11 NOTE — Progress Notes (Signed)
  Subjective:    Patient ID: Michelle [name], female    DOB: 11-03-1932, 78 y.o.   MRN: [id number]  HPI  78 year old patient has a history of COPD as well as hypertension. Additionally she has type 2 diabetes and coronary artery disease. For the past couple weeks she is inconsistently high blood pressure readings at home most marked in the systolic readings. Her medical regimen reviewed she has COPD and also complains of some slight increasing shortness of breath. Denies any chest pain her last hemoglobin A1c was elevated. Fasting blood sugar 1:30    Review of Systems  Constitutional: Negative.   HENT: Negative for hearing loss, congestion, sore throat, rhinorrhea, dental problem, sinus pressure and tinnitus.   Eyes: Negative for pain, discharge and visual disturbance.  Respiratory: Positive for shortness of breath. Negative for cough.   Cardiovascular: Negative for chest pain, palpitations and leg swelling.  Gastrointestinal: Negative for nausea, vomiting, abdominal pain, diarrhea, constipation, blood in stool and abdominal distention.  Genitourinary: Negative for dysuria, urgency, frequency, hematuria, flank pain, vaginal bleeding, vaginal discharge, difficulty urinating, vaginal pain and pelvic pain.  Musculoskeletal: Negative for joint swelling, arthralgias and gait problem.  Skin: Negative for rash.  Neurological: Negative for dizziness, syncope, speech difficulty, weakness, numbness and headaches.  Hematological: Negative for adenopathy.  Psychiatric/Behavioral: Negative for behavioral problems, dysphoric mood and agitation. The patient is not nervous/anxious.        Objective:   Physical Exam  Constitutional: She is oriented to person, place, and time. She appears well-developed and well-nourished.       Blood pressure 160-164/80-84  HENT:  Head: Normocephalic.  Right Ear: External ear normal.  Left Ear: External ear normal.  Mouth/Throat: Oropharynx is clear and moist.  Eyes:  Conjunctivae and EOM are normal. Pupils are equal, round, and reactive to light.  Neck: Normal range of motion. Neck supple. No thyromegaly present.  Cardiovascular: Normal rate, regular rhythm, normal heart sounds and intact distal pulses.   Pulmonary/Chest: Effort normal and breath sounds normal.       O2 saturation 97%  Abdominal: Soft. Bowel sounds are normal. She exhibits no mass. There is no tenderness.  Musculoskeletal: Normal range of motion.  Lymphadenopathy:    She has no cervical adenopathy.  Neurological: She is alert and oriented to person, place, and time.  Skin: Skin is warm and dry. No rash noted.  Psychiatric: She has a normal mood and affect. Her behavior is normal.          Assessment & Plan:   Hypertension. Suboptimal control and elevated systolic readings. We'll increase amlodipine to 10 mg daily. Will recheck in 4 weeks low salt diet encouraged COPD Diabetes mellitus. Will recheck in one month and hemoglobin A1c checked at that time. Fasting blood sugar today 1:30

## 2011-10-11 NOTE — Patient Instructions (Addendum)
Limit your sodium (Salt) intake  Increase amlodipine to 10 mg daily  Please check your blood pressure on a regular basis.  If it is consistently greater than 150/90, please make an office appointment.  Return in one month for follow-up  Bring  yiour  blood pressure monitor to your next visit

## 2011-11-04 ENCOUNTER — Other Ambulatory Visit: Payer: Self-pay | Admitting: Internal Medicine

## 2011-11-07 ENCOUNTER — Encounter: Payer: Self-pay | Admitting: Internal Medicine

## 2011-11-07 ENCOUNTER — Ambulatory Visit (INDEPENDENT_AMBULATORY_CARE_PROVIDER_SITE_OTHER): Payer: Medicare Other | Admitting: Internal Medicine

## 2011-11-07 VITALS — BP 130/72 | HR 80 | Temp 97.8°F | Wt 200.0 lb

## 2011-11-07 DIAGNOSIS — E785 Hyperlipidemia, unspecified: Secondary | ICD-10-CM

## 2011-11-07 DIAGNOSIS — E119 Type 2 diabetes mellitus without complications: Secondary | ICD-10-CM

## 2011-11-07 DIAGNOSIS — I10 Essential (primary) hypertension: Secondary | ICD-10-CM

## 2011-11-07 DIAGNOSIS — I2510 Atherosclerotic heart disease of native coronary artery without angina pectoris: Secondary | ICD-10-CM

## 2011-11-07 NOTE — Assessment & Plan Note (Signed)
Better controlled Continue sam meds---I have regular f/u with her in July

## 2011-11-07 NOTE — Assessment & Plan Note (Signed)
Lab Results  Component Value Date   HGBA1C 8.0* 07/08/2011   Check labs next OV

## 2011-11-08 NOTE — Progress Notes (Signed)
Patient ID: Michelle [name], female   DOB: 1933/07/06, 78 y.o.   MRN: [id number] Patient comes in for followup of hypertension. Reviewed recent previous note. Reviewed medications. Patient has no complaints. She denies chest pain, shortness of breath PND or lower edema.  Past Medical History  Diagnosis Date  . COPD (chronic obstructive pulmonary disease)   . CAD (coronary artery disease)   . Hyperlipidemia   . Hypertension   . Diabetes mellitus     History   Social History  . Marital Status: Married    Spouse Name: N/A    Number of Children: N/A  . Years of Education: N/A   Occupational History  . Not on file.   Social History Main Topics  . Smoking status: Former Games developer  . Smokeless tobacco: Not on file  . Alcohol Use:   . Drug Use:   . Sexually Active:    Other Topics Concern  . Not on file   Social History Narrative  . No narrative on file    Past Surgical History  Procedure Date  . Cholecystectomy   . Cataract extraction 2010  . Ptca     No family history on file.  Allergies  Allergen Reactions  . Codeine Sulfate     REACTION: nausea  . Morphine Sulfate     REACTION: nausea    Current Outpatient Prescriptions on File Prior to Visit  Medication Sig Dispense Refill  . ACCU-CHEK COMPACT TEST DRUM test strip TEST 3 TIMES DAILY AS DIRECTED.  300 each  3  . albuterol (PROVENTIL HFA) 108 (90 BASE) MCG/ACT inhaler Inhale 2 puffs into the lungs every 6 (six) hours as needed for wheezing or shortness of breath.  3.7 g  3  . albuterol (PROVENTIL) (2.5 MG/3ML) 0.083% nebulizer solution INHALE 1 VIAL USING NEBULIZER FOUR TIMES A DAY AS NEEDED  150 mL  2  . amitriptyline (ELAVIL) 25 MG tablet Take 1 tablet (25 mg total) by mouth at bedtime as needed for sleep.  30 tablet  3  . amLODipine (NORVASC) 10 MG tablet Take 1 tablet (10 mg total) by mouth daily.  90 tablet  3  . aspirin 81 MG tablet Take 81 mg by mouth daily.        . CRESTOR 40 MG tablet TAKE ONE TABLET BY  MOUTH DAILY  90 tablet  9  . enalapril (VASOTEC) 20 MG tablet TAKE ONE TABLET BY MOUTH TWICE DAILY  180 tablet  3  . fenofibrate micronized (LOFIBRA) 134 MG capsule TAKE ONE CAPSULE BY MOUTH DAILY  30 capsule  3  . fish oil-omega-3 fatty acids 1000 MG capsule Take 2 g by mouth daily.        . Fluticasone-Salmeterol (ADVAIR DISKUS) 250-50 MCG/DOSE AEPB Inhale 1 puff into the lungs 2 (two) times daily.  60 each  6  . folic acid (FOLVITE) 400 MCG tablet Take 400 mcg by mouth daily.        . furosemide (LASIX) 20 MG tablet TAKE 1 TABLET BY MOUTH ONCE A DAY  90 tablet  2  . glimepiride (AMARYL) 2 MG tablet Take 1 tablet (2 mg total) by mouth 2 (two) times daily before a meal.  180 tablet  3  . meclizine (ANTIVERT) 25 MG tablet Take 25 mg by mouth 2 (two) times daily.        . meloxicam (MOBIC) 7.5 MG tablet TAKE ONE TABLET BY MOUTH DAILY  30 tablet  3  . metFORMIN (GLUCOPHAGE) 1000  MG tablet TAKE 1 TABLET BY MOUTH TWO TIMES A DAY  180 tablet  1  . metoprolol-hydrochlorothiazide (LOPRESSOR HCT) 100-25 MG per tablet TAKE 1 TABLET BY MOUTH ONCE A DAY  90 tablet  5  . omeprazole (PRILOSEC) 20 MG capsule Take 1 capsule (20 mg total) by mouth daily.  90 capsule  1  . potassium chloride SA (K-DUR,KLOR-CON) 20 MEQ tablet TAKE 1 TABLET BY MOUTH ONCE A DAY  90 tablet  1  . SPIRIVA HANDIHALER 18 MCG inhalation capsule INHALE CONTENTS OF ONE CAPSULE DAILY  30 capsule  5  . vitamin E 400 UNIT capsule Take 400 Units by mouth daily.           patient denies chest pain, shortness of breath, orthopnea. Denies lower extremity edema, abdominal pain, change in appetite, change in bowel movements. Patient denies rashes, musculoskeletal complaints. No other specific complaints in a complete review of systems.   BP 130/72  Pulse 80  Temp(Src) 97.8 F (36.6 C) (Oral)  Wt 200 lb (90.719 kg)  Well-developed well-nourished female in no acute distress. HEENT exam atraumatic, normocephalic, extraocular muscles are intact.  Neck is supple. No jugular venous distention no thyromegaly. Chest clear to auscultation without increased work of breathing. Cardiac exam S1 and S2 are regular. Abdominal exam active bowel sounds, soft, nontender.

## 2011-11-13 ENCOUNTER — Other Ambulatory Visit: Payer: Self-pay | Admitting: Internal Medicine

## 2011-11-15 ENCOUNTER — Other Ambulatory Visit: Payer: Self-pay | Admitting: Internal Medicine

## 2011-12-01 ENCOUNTER — Other Ambulatory Visit: Payer: Self-pay | Admitting: Internal Medicine

## 2011-12-13 ENCOUNTER — Other Ambulatory Visit: Payer: Self-pay | Admitting: Internal Medicine

## 2012-01-11 ENCOUNTER — Other Ambulatory Visit: Payer: Self-pay | Admitting: Internal Medicine

## 2012-01-23 ENCOUNTER — Other Ambulatory Visit (INDEPENDENT_AMBULATORY_CARE_PROVIDER_SITE_OTHER): Payer: Medicare Other

## 2012-01-23 DIAGNOSIS — E119 Type 2 diabetes mellitus without complications: Secondary | ICD-10-CM

## 2012-01-23 LAB — BASIC METABOLIC PANEL
Calcium: 10.1 mg/dL (ref 8.4–10.5)
GFR: 63.7 mL/min (ref >60.00)
Glucose, Bld: 190 mg/dL — ABNORMAL HIGH (ref 70–99)
Potassium: 4.3 mEq/L (ref 3.5–5.1)
Sodium: 137 mEq/L (ref 135–145)

## 2012-01-23 LAB — HEPATIC FUNCTION PANEL
ALT: 23 U/L (ref 0–35)
AST: 28 U/L (ref 0–37)
Albumin: 4.1 g/dL (ref 3.5–5.2)
Alkaline Phosphatase: 52 U/L (ref 39–117)
Bilirubin, Direct: 0 mg/dL (ref 0.0–0.3)
Total Bilirubin: 0.5 mg/dL (ref 0.3–1.2)
Total Protein: 7.5 g/dL (ref 6.0–8.3)

## 2012-01-23 LAB — HEMOGLOBIN A1C: Hgb A1c MFr Bld: 8.5 % — ABNORMAL HIGH (ref 4.6–6.5)

## 2012-01-23 LAB — LIPID PANEL
Cholesterol: 174 mg/dL (ref 0–200)
HDL: 46 mg/dL
Total CHOL/HDL Ratio: 4
VLDL: 141.4 mg/dL — ABNORMAL HIGH (ref 0.0–40.0)

## 2012-01-23 LAB — LDL CHOLESTEROL, DIRECT: Direct LDL: 39 mg/dL

## 2012-01-30 ENCOUNTER — Ambulatory Visit (INDEPENDENT_AMBULATORY_CARE_PROVIDER_SITE_OTHER): Payer: Medicare Other | Admitting: Internal Medicine

## 2012-01-30 ENCOUNTER — Other Ambulatory Visit: Payer: Self-pay | Admitting: Internal Medicine

## 2012-01-30 ENCOUNTER — Encounter: Payer: Self-pay | Admitting: Internal Medicine

## 2012-01-30 VITALS — BP 138/76 | HR 92 | Temp 97.8°F | Wt 199.0 lb

## 2012-01-30 DIAGNOSIS — I2510 Atherosclerotic heart disease of native coronary artery without angina pectoris: Secondary | ICD-10-CM

## 2012-01-30 DIAGNOSIS — I10 Essential (primary) hypertension: Secondary | ICD-10-CM

## 2012-01-30 DIAGNOSIS — E119 Type 2 diabetes mellitus without complications: Secondary | ICD-10-CM

## 2012-01-30 DIAGNOSIS — J449 Chronic obstructive pulmonary disease, unspecified: Secondary | ICD-10-CM

## 2012-01-30 DIAGNOSIS — R0609 Other forms of dyspnea: Secondary | ICD-10-CM

## 2012-01-30 LAB — BRAIN NATRIURETIC PEPTIDE: Pro B Natriuretic peptide (BNP): 36 pg/mL (ref 0.0–100.0)

## 2012-01-30 MED ORDER — FUROSEMIDE 20 MG PO TABS: 40.0000 mg | ORAL_TABLET | Freq: Every day | ORAL | Status: DC

## 2012-01-30 MED ORDER — INSULIN GLARGINE 100 UNIT/ML ~~LOC~~ SOLN: 15.0000 [IU] | Freq: Every day | SUBCUTANEOUS | Status: DC

## 2012-01-30 NOTE — Assessment & Plan Note (Signed)
Dyspnea and PND are concerning Will check labs and echo and cxr

## 2012-01-30 NOTE — Assessment & Plan Note (Signed)
Fair control.

## 2012-01-30 NOTE — Assessment & Plan Note (Signed)
i doubt this is the cause of dyspnea and pnd i think her copd is stable

## 2012-01-30 NOTE — Progress Notes (Signed)
Patient ID: Michelle [name], female   DOB: [birth year], 78 y.o.   MRN: [id number]  patient comes in for followup of multiple medical problems including type 2 diabetes, hyperlipidemia, hypertension. The patient does not check blood sugar or blood pressure at home. The patetient does not follow an exercise or diet program. The patient denies any polyuria, polydipsia.  In the past the patient has gone to diabetic treatment center. The patient is tolerating medications  Without difficulty. The patient does admit to medication compliance.    In addition she complains of DOE and PND ongoing for 2 weeks. No chest pain  She has a rash on right calf-- present for greater than 10 years.  Past Medical History  Diagnosis Date  . COPD (chronic obstructive pulmonary disease)   . CAD (coronary artery disease)   . Hyperlipidemia   . Hypertension   . Diabetes mellitus     History   Social History  . Marital Status: Married    Spouse Name: N/A    Number of Children: N/A  . Years of Education: N/A   Occupational History  . Not on file.   Social History Main Topics  . Smoking status: Former Games developer  . Smokeless tobacco: Not on file  . Alcohol Use:   . Drug Use:   . Sexually Active:    Other Topics Concern  . Not on file   Social History Narrative  . No narrative on file    Past Surgical History  Procedure Date  . Cholecystectomy   . Cataract extraction 2010  . Ptca     No family history on file.  Allergies  Allergen Reactions  . Codeine Sulfate     REACTION: nausea  . Morphine Sulfate     REACTION: nausea    Current Outpatient Prescriptions on File Prior to Visit  Medication Sig Dispense Refill  . ACCU-CHEK COMPACT TEST DRUM test strip TEST 3 TIMES DAILY AS DIRECTED.  300 each  3  . albuterol (PROVENTIL) (2.5 MG/3ML) 0.083% nebulizer solution INHALE 1 VIAL USING NEBULIZER FOUR TIMES A DAY AS NEEDED  150 mL  2  . amLODipine (NORVASC) 10 MG tablet Take 1 tablet (10 mg total) by  mouth daily.  90 tablet  3  . aspirin 81 MG tablet Take 81 mg by mouth daily.        . CRESTOR 40 MG tablet TAKE ONE TABLET BY MOUTH DAILY  90 tablet  9  . enalapril (VASOTEC) 20 MG tablet TAKE ONE TABLET BY MOUTH TWICE DAILY  180 tablet  3  . fenofibrate micronized (LOFIBRA) 134 MG capsule TAKE ONE CAPSULE BY MOUTH DAILY  30 capsule  1  . fish oil-omega-3 fatty acids 1000 MG capsule Take 2 g by mouth daily.        . Fluticasone-Salmeterol (ADVAIR DISKUS) 250-50 MCG/DOSE AEPB Inhale 1 puff into the lungs 2 (two) times daily.  60 each  6  . folic acid (FOLVITE) 400 MCG tablet Take 400 mcg by mouth daily.        . furosemide (LASIX) 20 MG tablet TAKE 1 TABLET BY MOUTH ONCE A DAY  90 tablet  1  . glimepiride (AMARYL) 2 MG tablet Take 1 tablet (2 mg total) by mouth 2 (two) times daily before a meal.  180 tablet  3  . meclizine (ANTIVERT) 25 MG tablet Take 25 mg by mouth 2 (two) times daily.        . meloxicam (MOBIC) 7.5 MG tablet  TAKE ONE TABLET BY MOUTH DAILY  30 tablet  2  . metFORMIN (GLUCOPHAGE) 1000 MG tablet TAKE 1 TABLET BY MOUTH TWO TIMES A DAY  180 tablet  1  . metoprolol-hydrochlorothiazide (LOPRESSOR HCT) 100-25 MG per tablet TAKE 1 TABLET BY MOUTH ONCE A DAY  90 tablet  5  . omeprazole (PRILOSEC) 20 MG capsule Take 1 capsule (20 mg total) by mouth daily.  90 capsule  1  . potassium chloride SA (K-DUR,KLOR-CON) 20 MEQ tablet TAKE 1 TABLET BY MOUTH ONCE A DAY  90 tablet  1  . PROAIR HFA 108 (90 BASE) MCG/ACT inhaler INHALE 2 PUFFS INTO THE LUNGS EVERY 6 (SIX) HOURS AS NEEDED FOR WHEEZING OR SHORTNESS OF BREATH.  8.5 g  1  . SPIRIVA HANDIHALER 18 MCG inhalation capsule INHALE CONTENTS OF ONE CAPSULE DAILY  30 capsule  4  . vitamin E 400 UNIT capsule Take 400 Units by mouth daily.           patient denies chest pain, shortness of breath, orthopnea. Denies lower extremity edema, abdominal pain, change in appetite, change in bowel movements. Patient denies rashes, musculoskeletal complaints.  No other specific complaints in a complete review of systems.   BP 152/74  Pulse 92  Temp 97.8 F (36.6 C) (Oral)  Wt 199 lb (90.266 kg)  Well-developed well-nourished female in no acute distress. HEENT exam atraumatic, normocephalic, extraocular muscles are intact. Neck is supple. No jugular venous distention no thyromegaly. Chest clear to auscultation without increased work of breathing. Cardiac exam S1 and S2 are regular. Abdominal exam active bowel sounds, soft, nontender. Extremities no edema. Neurologic exam she is alert without any motor sensory deficits. Gait is normal.

## 2012-01-30 NOTE — Assessment & Plan Note (Signed)
Not controlled Needs to start insulin She refuses at this time I have taught her how to give insulin-- she will consider  dtr present.

## 2012-01-31 ENCOUNTER — Other Ambulatory Visit: Payer: Self-pay | Admitting: Internal Medicine

## 2012-01-31 DIAGNOSIS — I441 Atrioventricular block, second degree: Secondary | ICD-10-CM

## 2012-02-02 ENCOUNTER — Ambulatory Visit (INDEPENDENT_AMBULATORY_CARE_PROVIDER_SITE_OTHER)
Admission: RE | Admit: 2012-02-02 | Discharge: 2012-02-02 | Disposition: A | Discharge status: Home / Self Care | Payer: Medicare Other | Source: Ambulatory Visit | Attending: Internal Medicine | Admitting: Internal Medicine

## 2012-02-02 ENCOUNTER — Ambulatory Visit (HOSPITAL_COMMUNITY): Payer: Medicare Other | Attending: Cardiovascular Disease

## 2012-02-02 DIAGNOSIS — I2510 Atherosclerotic heart disease of native coronary artery without angina pectoris: Secondary | ICD-10-CM

## 2012-02-02 DIAGNOSIS — R0609 Other forms of dyspnea: Secondary | ICD-10-CM | POA: Insufficient documentation

## 2012-02-02 DIAGNOSIS — E119 Type 2 diabetes mellitus without complications: Secondary | ICD-10-CM | POA: Insufficient documentation

## 2012-02-02 DIAGNOSIS — I059 Rheumatic mitral valve disease, unspecified: Secondary | ICD-10-CM | POA: Insufficient documentation

## 2012-02-02 DIAGNOSIS — J449 Chronic obstructive pulmonary disease, unspecified: Secondary | ICD-10-CM | POA: Insufficient documentation

## 2012-02-02 DIAGNOSIS — I10 Essential (primary) hypertension: Secondary | ICD-10-CM | POA: Insufficient documentation

## 2012-02-02 DIAGNOSIS — I359 Nonrheumatic aortic valve disorder, unspecified: Secondary | ICD-10-CM | POA: Insufficient documentation

## 2012-02-02 DIAGNOSIS — J4489 Other specified chronic obstructive pulmonary disease: Secondary | ICD-10-CM | POA: Insufficient documentation

## 2012-02-02 DIAGNOSIS — E785 Hyperlipidemia, unspecified: Secondary | ICD-10-CM | POA: Insufficient documentation

## 2012-02-02 DIAGNOSIS — R0989 Other specified symptoms and signs involving the circulatory and respiratory systems: Secondary | ICD-10-CM | POA: Insufficient documentation

## 2012-02-02 HISTORY — PX: TRANSTHORACIC ECHOCARDIOGRAM: SHX275

## 2012-02-02 IMAGING — CR DG CHEST 2V
2 series · 2 of 2 positions shown · non-contrast
Comparison: [DATE]

CLINICAL DATA: Shortness of breath, hypertension, COPD.

CHEST - 2 VIEW

[view not recorded (1 of 2)]
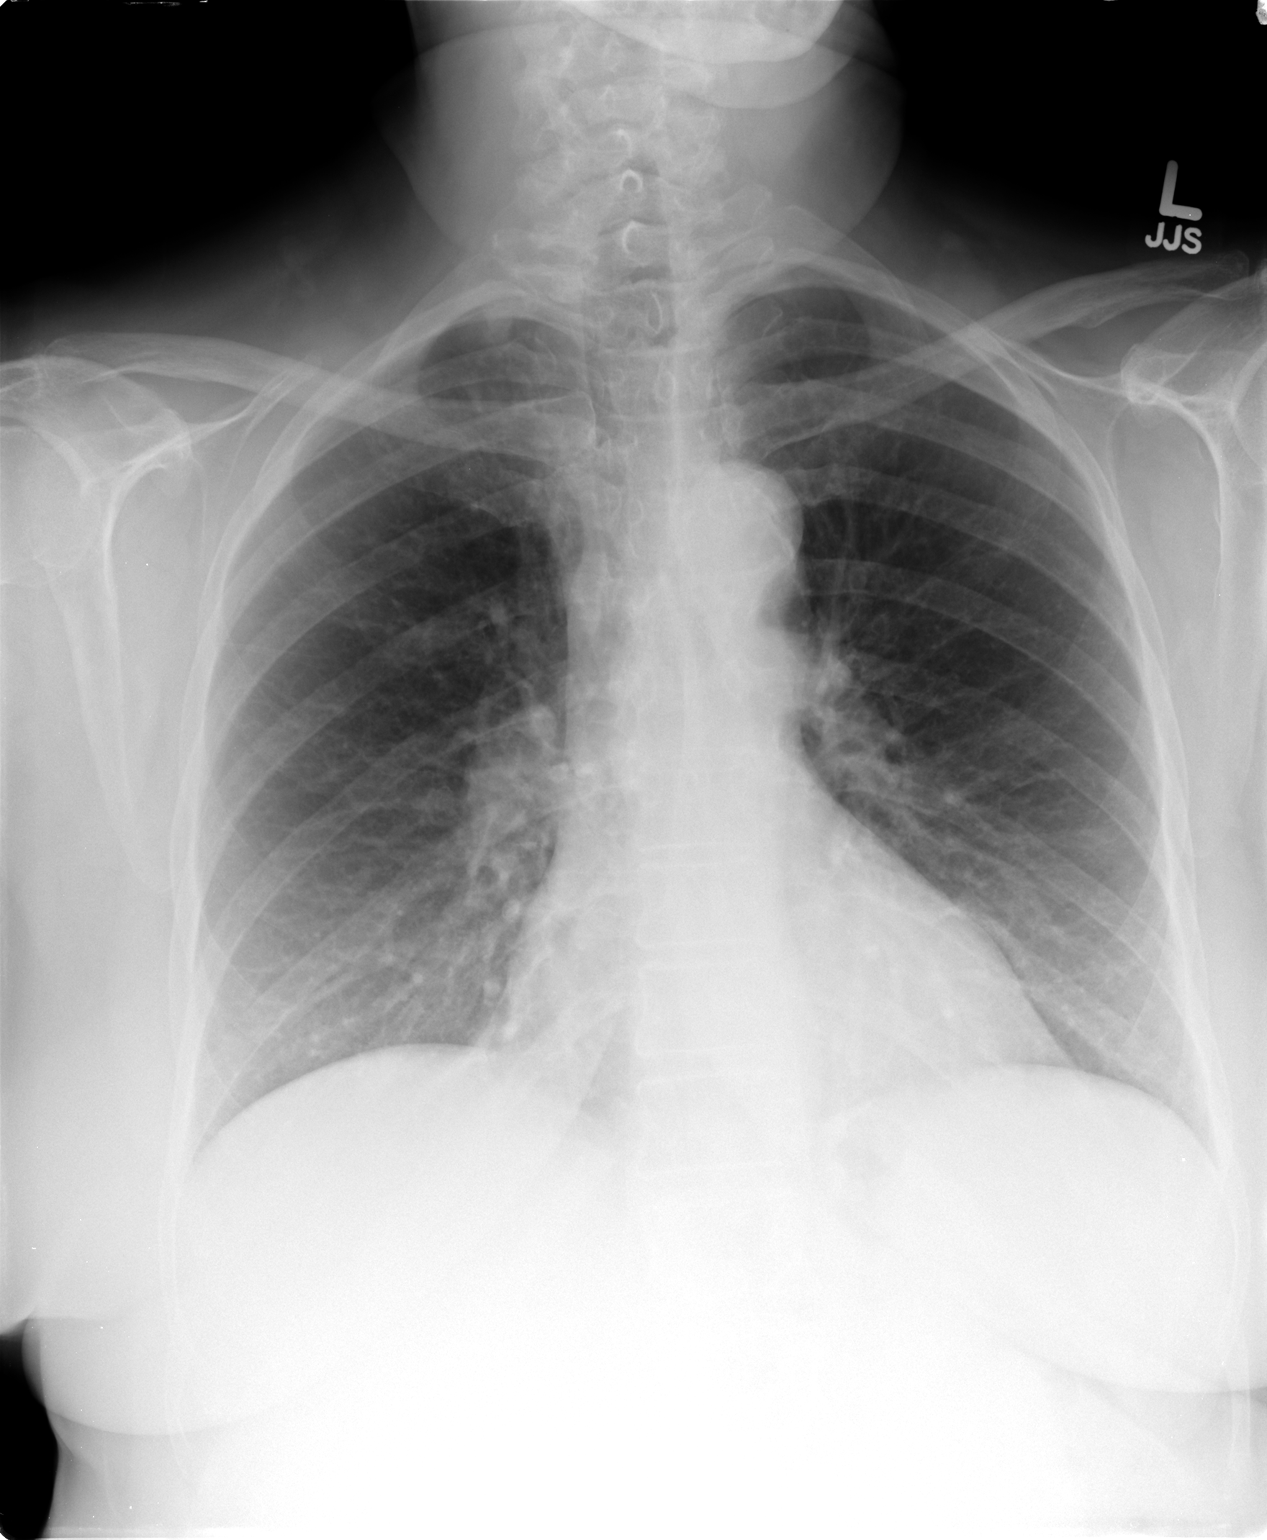

[view not recorded (2 of 2)]
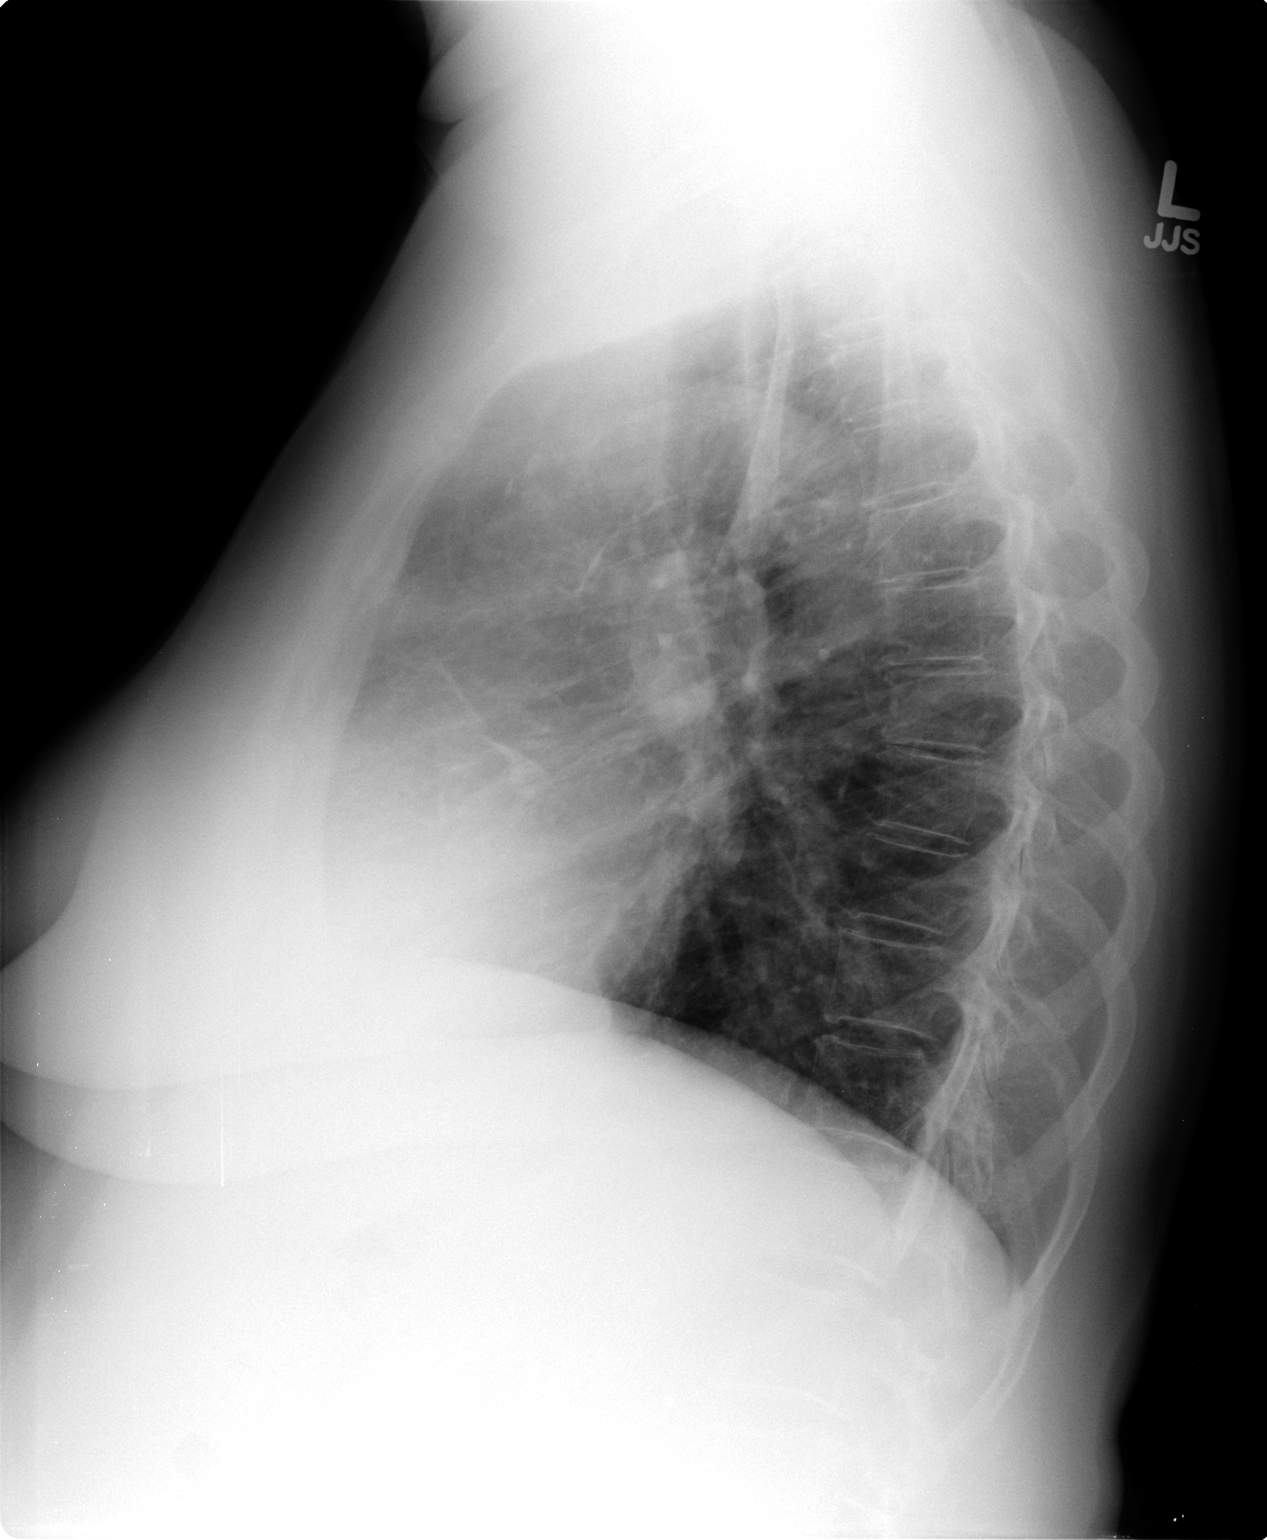

[2 of 2 positions shown; findings below may reference images not displayed]

FINDINGS: Heart and mediastinal contours are within normal limits.
No focal opacities or effusions.  No acute bony abnormality.
IMPRESSION: No active cardiopulmonary disease.

## 2012-02-02 NOTE — Progress Notes (Signed)
Echocardiogram performed.  

## 2012-02-03 ENCOUNTER — Other Ambulatory Visit: Payer: Self-pay | Admitting: Internal Medicine

## 2012-02-03 DIAGNOSIS — J449 Chronic obstructive pulmonary disease, unspecified: Secondary | ICD-10-CM

## 2012-02-06 ENCOUNTER — Other Ambulatory Visit: Payer: Self-pay | Admitting: Internal Medicine

## 2012-02-10 ENCOUNTER — Ambulatory Visit (INDEPENDENT_AMBULATORY_CARE_PROVIDER_SITE_OTHER): Payer: Medicare Other | Admitting: Internal Medicine

## 2012-02-10 DIAGNOSIS — J449 Chronic obstructive pulmonary disease, unspecified: Secondary | ICD-10-CM

## 2012-02-10 LAB — PULMONARY FUNCTION TEST

## 2012-02-10 NOTE — Progress Notes (Signed)
PFT done today. 

## 2012-03-05 ENCOUNTER — Ambulatory Visit: Payer: Medicare Other | Admitting: Cardiovascular Disease

## 2012-03-07 ENCOUNTER — Other Ambulatory Visit: Payer: Self-pay | Admitting: Internal Medicine

## 2012-03-09 ENCOUNTER — Encounter: Payer: Self-pay | Admitting: Cardiovascular Disease

## 2012-03-09 ENCOUNTER — Ambulatory Visit (INDEPENDENT_AMBULATORY_CARE_PROVIDER_SITE_OTHER): Payer: Medicare Other | Admitting: Cardiovascular Disease

## 2012-03-09 VITALS — BP 139/83 | HR 80 | Ht 65.0 in | Wt 196.0 lb

## 2012-03-09 DIAGNOSIS — R0600 Dyspnea, unspecified: Secondary | ICD-10-CM

## 2012-03-09 DIAGNOSIS — R0602 Shortness of breath: Secondary | ICD-10-CM

## 2012-03-09 NOTE — Patient Instructions (Addendum)
Your physician wants you to follow-up in:  4-6 weeks. You will receive a reminder letter in the mail two months in advance. If you don't receive a letter, please call our office to schedule the follow-up appointment.  Your physician has requested that you have a lexiscan myoview. For further information please visit [email or web address] Please follow instruction sheet, as given.

## 2012-03-09 NOTE — Progress Notes (Signed)
History of Present Illness: 78 yo WF with history of CAD, DM, HTN, HLD, COPD, former tobacco abuse who is referred today for evaluation of dyspnea and abnormal EKG. She was first diagnosed with CAD in 1990. Cath per Dr. Riley Kill with severe stenosis RCA (pt has pictures that show this, no records available). No cath since 1990. No recent stress testing.  She was seen by Dr. Cato Mulligan 01/30/12 and had c/o worsened SOB. CXR without acute disease. Echo arranged on 02/02/12 and showed normal LV function with trivial valve disease. EKG on 01/30/12 with first degree AV block and concern for one missed beat which could represent high grade heart block.  PFTs on 02/10/12 with mild obstructive airways disease with minimal response to bronchodilators. She continues to have dyspnea on exertion. She denies any chest pain, dizziness, near syncope or syncope. She is not aware of any irregularities of her heart rhythm. Her breathing is not at baseline. Reports weakness in legs.   Primary Care Physician: Birdie Sons   Past Medical History  Diagnosis Date  . COPD (chronic obstructive pulmonary disease)   . CAD (coronary artery disease)     Cardiac cath 1990 with Dr. Riley Kill and [name] No cath since 1990  . Hyperlipidemia   . Hypertension   . Diabetes mellitus     Past Surgical History  Procedure Date  . Cholecystectomy   . Cataract extraction 2010  . Ptca     Current Outpatient Prescriptions  Medication Sig Dispense Refill  . ACCU-CHEK COMPACT TEST DRUM test strip TEST 3 TIMES DAILY AS DIRECTED.  300 each  3  . albuterol (PROVENTIL) (2.5 MG/3ML) 0.083% nebulizer solution INHALE 1 VIAL USING NEBULIZER FOUR TIMES A DAY AS NEEDED  150 mL  1  . amLODipine (NORVASC) 10 MG tablet Take 1 tablet (10 mg total) by mouth daily.  90 tablet  3  . aspirin 81 MG tablet  Take 81 mg by mouth daily.        . CRESTOR 40 MG tablet TAKE ONE TABLET BY MOUTH DAILY  90 tablet  9  . enalapril (VASOTEC) 20 MG tablet TAKE ONE TABLET BY MOUTH TWICE DAILY  180 tablet  3  . fenofibrate micronized (LOFIBRA) 134 MG capsule TAKE ONE CAPSULE BY MOUTH DAILY  30 capsule  1  . fish oil-omega-3 fatty acids 1000 MG capsule Take 2 g by mouth daily.        . Fluticasone-Salmeterol (ADVAIR DISKUS) 250-50 MCG/DOSE AEPB Inhale 1 puff into the lungs 2 (two) times daily.  60 each  6  . folic acid (FOLVITE) 400 MCG tablet Take 400 mcg by mouth daily.        . furosemide (LASIX) 20 MG tablet Take 2 tablets (40 mg total) by mouth daily.  200 tablet  3  . glimepiride (AMARYL) 2 MG tablet Take 1 tablet (2 mg total) by mouth 2 (two) times daily before a meal.  180 tablet  3  . insulin glargine (LANTUS SOLOSTAR) 100 UNIT/ML injection Inject 15 Units into the skin at bedtime.  5 pen  PRN  . meclizine (ANTIVERT) 25 MG tablet TAKE 1 TABLET BY MOUTH TWO TIMES A DAY AS NEEDED  20 tablet  2  . meloxicam (MOBIC) 7.5 MG tablet TAKE ONE TABLET BY MOUTH DAILY  30 tablet  1  .  metFORMIN (GLUCOPHAGE) 1000 MG tablet TAKE 1 TABLET BY MOUTH TWO TIMES A DAY  180 tablet  1  . metoprolol-hydrochlorothiazide (LOPRESSOR HCT) 100-25 MG per tablet TAKE 1 TABLET BY MOUTH ONCE A DAY  90 tablet  5  . omeprazole (PRILOSEC) 20 MG capsule TAKE ONE CAPSULE BY MOUTH DAILY  90 capsule  1  . potassium chloride SA (K-DUR,KLOR-CON) 20 MEQ tablet TAKE 1 TABLET BY MOUTH ONCE A DAY  90 tablet  1  . PROAIR HFA 108 (90 BASE) MCG/ACT inhaler INHALE 2 PUFFS INTO THE LUNGS EVERY 6 (SIX) HOURS AS NEEDED FOR WHEEZING OR SHORTNESS OF BREATH.  8.5 g  1  . SPIRIVA HANDIHALER 18 MCG inhalation capsule INHALE CONTENTS OF ONE CAPSULE DAILY  30 capsule  4  . vitamin E 400 UNIT capsule Take 400 Units by mouth daily.          Allergies  Allergen Reactions  . Codeine Sulfate     REACTION: nausea  . Morphine Sulfate     REACTION: nausea     History   Social History  . Marital Status: Married    Spouse Name: N/A    Number of Children: 2  . Years of Education: N/A   Occupational History  . Retired    Social History Main Topics  . Smoking status: Former Smoker -- 1.0 packs/day for 55 years    Types: Cigarettes    Quit date: 09/29/2006  . Smokeless tobacco: Not on file  . Alcohol Use: No  . Drug Use: No  . Sexually Active: Not on file   Other Topics Concern  . Not on file   Social History Narrative  . No narrative on file    Family History  Problem Relation Age of Onset  . Heart attack Mother 92  . Cancer Father 10    Review of Systems:  As stated in the HPI and otherwise negative.   BP 139/83  Pulse 80  Ht 5\' 5"  (1.651 m)  Wt 196 lb (88.905 kg)  BMI 32.62 kg/m2  Physical Examination: General: Well developed, well nourished, NAD HEENT: OP clear, mucus membranes moist SKIN: warm, dry. No rashes. Neuro: No focal deficits Musculoskeletal: Muscle strength 5/5 all ext Psychiatric: Mood and affect normal Neck: No JVD, no carotid bruits, no thyromegaly, no lymphadenopathy. Lungs:Clear bilaterally, no wheezes, rhonci, crackles Cardiovascular: Regular rate and rhythm. No murmurs, gallops or rubs. Abdomen:Soft. Bowel sounds present. Non-tender.  Extremities: No lower extremity edema. Pulses are 2 + in the bilateral DP/PT.  EKG: Sinus with first degree AV block. Rate 79 bpm.   Echo 02/02/12:  Left ventricle: The cavity size was normal. Wall thickness was increased in a pattern of mild LVH. Systolic function was normal. The estimated ejection fraction was in the range of 55% to 65%. Wall motion was normal; there were no regional wall motion abnormalities. Doppler parameters are consistent with abnormal left ventricular relaxation (grade 1 diastolic dysfunction). - Aortic valve: Trivial regurgitation. - Mitral valve: Mild regurgitation. - Atrial septum: No defect or patent foramen ovale  was identified. - Pericardium, extracardiac: A trivial pericardial effusion was identified.  Assessment and Plan:   1. CAD: She has a history of CAD with PTCA of a large dominant RCA in 1990. She has had no stress testing or caths since then. She now has dyspnea with exertion that seems out of proportion to her mild obstructive airways disease noted on recent PFTs. She has pictures today of her PTCA performed in  1990 by Dr. Riley Kill. She is on good medical therapy. I think stress testing is indicated to exclude ischemia with recent worsening of dyspnea. Will arrange [employer] She cannot walk on the treadmill secondary to knee pain and leg weakness.   2. HTN: BP well controlled today  3. Dyspnea: See above. This may be related to her COPD but given history of CAD with prior PTCA 23 years ago and no further cardiac workup since then, will arrange stress test to exclude ischemia. LV function normal on echo.   4. Possible second degree AV block noted one beat on EKG 01/30/12. No dizzinness, syncope. No palpitatins or skipped beats. EKG today with first degree AV block. No further workup at this time. Could arrange cardiac monitor if she has any symptoms related to this.

## 2012-03-19 ENCOUNTER — Other Ambulatory Visit: Payer: Self-pay | Admitting: Internal Medicine

## 2012-03-22 ENCOUNTER — Encounter: Payer: Self-pay | Admitting: Cardiology

## 2012-03-29 ENCOUNTER — Ambulatory Visit (HOSPITAL_COMMUNITY): Payer: Medicare Other | Attending: Internal Medicine | Admitting: Radiology

## 2012-03-29 VITALS — BP 149/74 | HR 80 | Ht 65.0 in | Wt 194.0 lb

## 2012-03-29 DIAGNOSIS — Z8249 Family history of ischemic heart disease and other diseases of the circulatory system: Secondary | ICD-10-CM | POA: Insufficient documentation

## 2012-03-29 DIAGNOSIS — R0602 Shortness of breath: Secondary | ICD-10-CM

## 2012-03-29 DIAGNOSIS — E119 Type 2 diabetes mellitus without complications: Secondary | ICD-10-CM | POA: Insufficient documentation

## 2012-03-29 DIAGNOSIS — R5381 Other malaise: Secondary | ICD-10-CM | POA: Insufficient documentation

## 2012-03-29 DIAGNOSIS — R0609 Other forms of dyspnea: Secondary | ICD-10-CM | POA: Insufficient documentation

## 2012-03-29 DIAGNOSIS — E785 Hyperlipidemia, unspecified: Secondary | ICD-10-CM

## 2012-03-29 DIAGNOSIS — R0600 Dyspnea, unspecified: Secondary | ICD-10-CM

## 2012-03-29 DIAGNOSIS — R0989 Other specified symptoms and signs involving the circulatory and respiratory systems: Secondary | ICD-10-CM | POA: Insufficient documentation

## 2012-03-29 DIAGNOSIS — I2510 Atherosclerotic heart disease of native coronary artery without angina pectoris: Secondary | ICD-10-CM

## 2012-03-29 DIAGNOSIS — I10 Essential (primary) hypertension: Secondary | ICD-10-CM | POA: Insufficient documentation

## 2012-03-29 DIAGNOSIS — Z87891 Personal history of nicotine dependence: Secondary | ICD-10-CM | POA: Insufficient documentation

## 2012-03-29 DIAGNOSIS — R5383 Other fatigue: Secondary | ICD-10-CM | POA: Insufficient documentation

## 2012-03-29 MED ORDER — TECHNETIUM TC 99M SESTAMIBI GENERIC - CARDIOLITE
33.0000 | Freq: Once | INTRAVENOUS | Status: AC | PRN
  Administered 2012-03-29: 33 via INTRAVENOUS

## 2012-03-29 MED ORDER — TECHNETIUM TC 99M SESTAMIBI GENERIC - CARDIOLITE
11.0000 | Freq: Once | INTRAVENOUS | Status: AC | PRN
  Administered 2012-03-29: 11 via INTRAVENOUS

## 2012-03-29 MED ORDER — REGADENOSON 0.4 MG/5ML IV SOLN
0.4000 mg | Freq: Once | INTRAVENOUS | Status: AC
  Administered 2012-03-29: 0.4 mg via INTRAVENOUS

## 2012-03-29 NOTE — Progress Notes (Signed)
[facility] SITE 3 NUCLEAR MED [address] [place] [address] [phone]  Cardiology Nuclear Med Study  Michelle [name] is a 78 y.o. female     MRN : [id number]     DOB: 12-06-1932  Procedure Date: 03/29/2012  Nuclear Med Background Indication for Stress Test:  Evaluation for Ischemia and PTCA Patency History:  '90 PTCA; '03 WUX:[id number]. EF=66%; 02/02/12 Echo:65% Cardiac Risk Factors: Family History - CAD, History of Smoking, Hypertension, IDDM Type 2, Lipids and Obesity  Symptoms:  DOE, Fatigue and Fatigue with Exertion   Nuclear Pre-Procedure Caffeine/Decaff Intake:  None in 12 hours NPO After: 8:00pm   Lungs:  Clear. O2 Sat: 99% on room air. IV 0.9% NS with Angio Cath:  22g  IV Site: R Hand  IV Started by:  Doyne Keel, CNMT  Chest Size (in):  36 Cup Size: B  Height: 5\' 5"  (1.651 m)  Weight:  194 lb (87.998 kg)  BMI:  Body mass index is 32.28 kg/(m^2). Tech Comments:  Patient held all am med's this morning, including all diabetes meds; patient used neb tx this am; CBG of 141 at 530 am    Nuclear Med Study 1 or 2 day study: 1 day  Stress Test Type:  Eugenie Birks  Reading MD: Dietrich Pates, MD  Order Authorizing Provider:  Verne Carrow, MD  Resting Radionuclide: Technetium 61m Sestamibi  Resting Radionuclide Dose: 11.0 mCi   Stress Radionuclide:  Technetium 59m Sestamibi  Stress Radionuclide Dose: 33.0 mCi           Stress Protocol Rest HR: 80 Stress HR: 106  Rest BP: 149/74 Stress BP: 154/77  Exercise Time (min): n/a METS: n/a   Predicted Max HR: 143 bpm % Max HR: 74.13 bpm Rate Pressure Product: 02725   Dose of Adenosine (mg):  n/a Dose of Lexiscan: 0.4 mg  Dose of Atropine (mg): n/a Dose of Dobutamine: n/a   Stress Test Technologist: Smiley Houseman, CMA-N  Nuclear Technologist:  Domenic Polite, CNMT     Rest Procedure:  Myocardial perfusion imaging was performed at rest 45 minutes following the intravenous administration of Technetium  80m Sestamibi.  Rest ECG: NSR  First degree AV block  Stress Procedure:  The patient received IV Lexiscan 0.4 mg over 15-seconds.  Technetium 23m Sestamibi injected at 30-seconds.  There were no significant changes with Lexiscan, occasional PVC's were noted.  Quantitative spect images were obtained after a 45 minute delay.  Stress ECG: No significant change from baseline ECG  QPS Raw Data Images:  Images were motion corrected.  Soft tissue (diaphragm, bowel activity) underlies heart. Stress Images:  Normal homogeneous uptake in all areas of the myocardium. Rest Images:  Normal homogeneous uptake in all areas of the myocardium. Subtraction (SDS):  Normal Transient Ischemic Dilatation (Normal <1.22):  1.09 Lung/Heart Ratio (Normal <0.45):  0.29  Quantitative Gated Spect Images QGS EDV:  59 ml QGS ESV:  15 ml  Impression Exercise Capacity:  Lexiscan with no exercise. BP Response:  Normal blood pressure response. Clinical Symptoms:  No chest pain. ECG Impression:  No significant ST segment change suggestive of ischemia. Comparison with Prior Nuclear Study: No change from prvevious report.  Overall Impression:  Normal stress nuclear study.  LV Ejection Fraction: 74%.  LV Wall Motion:  NL LV Function; NL Wall Motion  Dietrich Pates    .

## 2012-04-12 ENCOUNTER — Ambulatory Visit (INDEPENDENT_AMBULATORY_CARE_PROVIDER_SITE_OTHER): Payer: Medicare Other | Admitting: Cardiovascular Disease

## 2012-04-12 ENCOUNTER — Encounter: Payer: Self-pay | Admitting: Cardiovascular Disease

## 2012-04-12 VITALS — BP 124/76 | HR 75 | Ht 65.0 in | Wt 193.0 lb

## 2012-04-12 DIAGNOSIS — I2510 Atherosclerotic heart disease of native coronary artery without angina pectoris: Secondary | ICD-10-CM

## 2012-04-12 DIAGNOSIS — I10 Essential (primary) hypertension: Secondary | ICD-10-CM

## 2012-04-12 NOTE — Patient Instructions (Addendum)
Your physician wants you to follow-up in:  12 months.  You will receive a reminder letter in the mail two months in advance. If you don't receive a letter, please call our office to schedule the follow-up appointment.   

## 2012-04-12 NOTE — Progress Notes (Signed)
History of Present Illness: 78 yo WF with history of CAD, DM, HTN, HLD, COPD, former tobacco abuse who is here today for cardiac follow up. She was seen as a new patient in August 2013 for evaluation of dyspnea and abnormal EKG. She was first diagnosed with CAD in 1990. Cath per Michelle [name] with severe stenosis RCA (pt has pictures that show this, no records available). No cath since 1990.  She was seen by Michelle [name] 01/30/12 and had c/o worsened SOB. CXR without acute disease. Echo arranged on 02/02/12 and showed normal LV function with trivial valve disease. EKG on 01/30/12 with first degree AV block and one missed beat. PFTs on 02/10/12 with mild obstructive airways disease with minimal response to bronchodilators. She continued to have dyspnea on exertion. She denied any chest pain, dizziness, near syncope or syncope. She is not aware of any irregularities of her heart rhythm.   She is here today for follow up. She is feeling well. No chest pain. Breathing is improved. Her main complaint is her knee pain.   Primary Care Physician: Michelle [name]  Last Lipid Profile:Lipid Panel     Component Value Date/Time   CHOL 174 01/23/2012 0828   TRIG 707.0 Triglyceride is over 400; calculations on Lipids are invalid. * 01/23/2012 0828   HDL 46.00 01/23/2012 0828   CHOLHDL 4 01/23/2012 0828   VLDL 141.4* 01/23/2012 0828     Past Medical History  Diagnosis Date  . COPD (chronic obstructive pulmonary disease)   . CAD (coronary artery disease)     Cardiac cath 1990 with Michelle [name] and [name] No cath since 1990  . Hyperlipidemia   . Hypertension   . Diabetes mellitus     Past Surgical History  Procedure Date  . Cholecystectomy   . Cataract extraction 2010  . Ptca 1990    Current Outpatient Prescriptions  Medication Sig Dispense Refill  . ACCU-CHEK  COMPACT TEST DRUM test strip TEST 3 TIMES DAILY AS DIRECTED.  300 each  3  . albuterol (PROVENTIL) (2.5 MG/3ML) 0.083% nebulizer solution INHALE 1 VIAL USING NEBULIZER FOUR TIMES A DAY AS NEEDED  150 mL  0  . amLODipine (NORVASC) 10 MG tablet Take 1 tablet (10 mg total) by mouth daily.  90 tablet  3  . aspirin 81 MG tablet Take 81 mg by mouth daily.        . CRESTOR 40 MG tablet TAKE ONE TABLET BY MOUTH DAILY  90 tablet  9  . enalapril (VASOTEC) 20 MG tablet TAKE ONE TABLET BY MOUTH TWICE DAILY  180 tablet  3  . fenofibrate micronized (LOFIBRA) 134 MG capsule TAKE ONE CAPSULE BY MOUTH DAILY  30 capsule  1  . fish oil-omega-3 fatty acids 1000 MG capsule Take 2 g by mouth daily.        . Fluticasone-Salmeterol (ADVAIR DISKUS) 250-50 MCG/DOSE AEPB Inhale 1 puff into the lungs 2 (two) times daily.  60 each  6  . folic acid (FOLVITE) 400 MCG tablet Take 400 mcg by mouth daily.        . furosemide (LASIX) 20 MG tablet Take 2 tablets (40 mg total) by mouth daily.  200 tablet  3  . glimepiride (AMARYL) 2 MG tablet Take 1 tablet (2 mg total) by mouth 2 (two) times daily before a meal.  180 tablet  3  . insulin glargine (LANTUS SOLOSTAR) 100 UNIT/ML injection Inject 15 Units into the skin at bedtime.  5 pen  PRN  . meclizine (ANTIVERT) 25 MG tablet TAKE 1 TABLET BY MOUTH TWO TIMES A DAY AS NEEDED  20 tablet  2  . meloxicam (MOBIC) 7.5 MG tablet TAKE ONE TABLET BY MOUTH DAILY  30 tablet  1  . metFORMIN (GLUCOPHAGE) 1000 MG tablet TAKE 1 TABLET BY MOUTH TWO TIMES A DAY  180 tablet  1  . metoprolol-hydrochlorothiazide (LOPRESSOR HCT) 100-25 MG per tablet TAKE 1 TABLET BY MOUTH ONCE A DAY  90 tablet  5  . omeprazole (PRILOSEC) 20 MG capsule TAKE ONE CAPSULE BY MOUTH DAILY  90 capsule  1  . potassium chloride SA (K-DUR,KLOR-CON) 20 MEQ tablet TAKE 1 TABLET BY MOUTH ONCE A DAY  90 tablet  1  . PROAIR HFA 108 (90 BASE) MCG/ACT inhaler INHALE 2 PUFFS INTO THE LUNGS EVERY 6 (SIX) HOURS AS NEEDED FOR WHEEZING OR  SHORTNESS OF BREATH.  8.5 g  1  . SPIRIVA HANDIHALER 18 MCG inhalation capsule INHALE CONTENTS OF ONE CAPSULE DAILY  30 capsule  4  . vitamin E 400 UNIT capsule Take 400 Units by mouth daily.          Allergies  Allergen Reactions  . Codeine Sulfate     REACTION: nausea  . Morphine Sulfate     REACTION: nausea    History   Social History  . Marital Status: Married    Spouse Name: N/A    Number of Children: 2  . Years of Education: N/A   Occupational History  . Retired    Social History Main Topics  . Smoking status: Former Smoker -- 1.0 packs/day for 55 years    Types: Cigarettes    Quit date: 09/29/2006  . Smokeless tobacco: Not on file  . Alcohol Use: No  . Drug Use: No  . Sexually Active: Not on file   Other Topics Concern  . Not on file   Social History Narrative  . No narrative on file    Family History  Problem Relation Age of Onset  . Heart attack Mother 50  . Cancer Father 5    Review of Systems:  As stated in the HPI and otherwise negative.   BP 124/76  Pulse 75  Ht 5\' 5"  (1.651 m)  Wt 193 lb (87.544 kg)  BMI 32.12 kg/m2  Physical Examination: General: Well developed, well nourished, NAD HEENT: OP clear, mucus membranes moist SKIN: warm, dry. No rashes. Neuro: No focal deficits Musculoskeletal: Muscle strength 5/5 all ext Psychiatric: Mood and affect normal Neck: No JVD, no carotid bruits, no thyromegaly, no lymphadenopathy. Lungs:Clear bilaterally with prolonged expiratory phase. No wheezes, rhonci, crackles Cardiovascular: Regular rate and rhythm. No murmurs, gallops or rubs. Abdomen:Soft. Bowel sounds present. Non-tender.  Extremities: No lower extremity edema. Pulses are 2 + in the bilateral DP/PT.  Lexiscan Stress Myoview: 03/29/12:  Stress Procedure: The patient received IV Lexiscan 0.4 mg over 15-seconds. Technetium 49m Sestamibi injected at 30-seconds. There were no significant changes with Lexiscan, occasional PVC's were noted.  Quantitative spect images were obtained after a 45 minute delay.  Stress ECG: No significant change from baseline ECG  QPS  Raw Data Images: Images were motion corrected. Soft tissue (diaphragm, bowel activity) underlies heart.  Stress Images: Normal homogeneous uptake in all areas of the myocardium.  Rest Images: Normal homogeneous uptake in all areas of the  myocardium.  Subtraction (SDS): Normal  Transient Ischemic Dilatation (Normal <1.22): 1.09  Lung/Heart Ratio (Normal <0.45): 0.29  Quantitative Gated Spect Images  QGS EDV: 59 ml  QGS ESV: 15 ml  Impression  Exercise Capacity: Lexiscan with no exercise.  BP Response: Normal blood pressure response.  Clinical Symptoms: No chest pain.  ECG Impression: No significant ST segment change suggestive of ischemia.  Comparison with Prior Nuclear Study: No change from prvevious report.  Overall Impression: Normal stress nuclear study.  LV Ejection Fraction: 74%. LV Wall Motion: NL LV Function; NL Wall Motion      Assessment and Plan:   1. CAD: She has a history of CAD with PTCA of a large dominant RCA in 1990. Stress myoview  03/29/12 with normal LV systolic function and no evidence of ischemia.   2. HTN: BP well controlled today   3. Dyspnea: This does not appear to be cardiac related. Most likely related to underlying lung disease.  LV function normal on echo. No ischemia on stress test.   4. Possible second degree AV block noted one beat on EKG 01/30/12. No dizzinness, syncope. No palpitations or skipped beats. No further workup at this time. Could arrange cardiac monitor if she has any symptoms related to this.

## 2012-04-19 ENCOUNTER — Other Ambulatory Visit: Payer: Self-pay | Admitting: Internal Medicine

## 2012-04-20 ENCOUNTER — Other Ambulatory Visit: Payer: Self-pay | Admitting: Internal Medicine

## 2012-04-24 ENCOUNTER — Other Ambulatory Visit: Payer: Self-pay | Admitting: Internal Medicine

## 2012-05-02 ENCOUNTER — Other Ambulatory Visit: Payer: Self-pay | Admitting: Internal Medicine

## 2012-05-11 ENCOUNTER — Other Ambulatory Visit: Payer: Self-pay | Admitting: *Deleted

## 2012-05-11 MED ORDER — POTASSIUM CHLORIDE CRYS ER 20 MEQ PO TBCR: 20.0000 meq | EXTENDED_RELEASE_TABLET | Freq: Every day | ORAL | Status: DC

## 2012-05-11 MED ORDER — MELOXICAM 7.5 MG PO TABS: 7.5000 mg | ORAL_TABLET | Freq: Every day | ORAL | Status: DC

## 2012-05-11 MED ORDER — METOPROLOL-HYDROCHLOROTHIAZIDE 100-25 MG PO TABS: 1.0000 | ORAL_TABLET | Freq: Every day | ORAL | Status: DC

## 2012-05-17 ENCOUNTER — Other Ambulatory Visit: Payer: Self-pay | Admitting: Internal Medicine

## 2012-05-31 ENCOUNTER — Other Ambulatory Visit: Payer: Self-pay | Admitting: Internal Medicine

## 2012-07-12 ENCOUNTER — Other Ambulatory Visit: Payer: Self-pay | Admitting: Internal Medicine

## 2012-07-17 ENCOUNTER — Other Ambulatory Visit: Payer: Self-pay | Admitting: Internal Medicine

## 2012-07-17 ENCOUNTER — Telehealth: Payer: Self-pay | Admitting: Internal Medicine

## 2012-07-17 MED ORDER — MELOXICAM 7.5 MG PO TABS: 7.5000 mg | ORAL_TABLET | Freq: Every day | ORAL | Status: DC

## 2012-07-17 NOTE — Telephone Encounter (Signed)
Pt has been on MOBIC. States the pain has been so bad that she has to take 2 - and now she is completely out. Requesting Dr. Cato Mulligan call her more MOBIC in - [facility] on [address]

## 2012-07-17 NOTE — Telephone Encounter (Signed)
done

## 2012-07-19 ENCOUNTER — Other Ambulatory Visit: Payer: Self-pay | Admitting: *Deleted

## 2012-07-19 MED ORDER — MELOXICAM 7.5 MG PO TABS: 15.0000 mg | ORAL_TABLET | Freq: Every day | ORAL | Status: DC

## 2012-07-24 ENCOUNTER — Other Ambulatory Visit: Payer: Self-pay | Admitting: Internal Medicine

## 2012-08-07 ENCOUNTER — Other Ambulatory Visit: Payer: Self-pay | Admitting: Internal Medicine

## 2012-08-09 ENCOUNTER — Other Ambulatory Visit: Payer: Self-pay | Admitting: Internal Medicine

## 2012-08-14 ENCOUNTER — Ambulatory Visit (INDEPENDENT_AMBULATORY_CARE_PROVIDER_SITE_OTHER): Payer: Medicare Other | Admitting: Family

## 2012-08-14 ENCOUNTER — Encounter: Payer: Self-pay | Admitting: Family

## 2012-08-14 VITALS — BP 160/80 | HR 90 | Temp 97.9°F | Resp 20 | Wt 199.0 lb

## 2012-08-14 DIAGNOSIS — E119 Type 2 diabetes mellitus without complications: Secondary | ICD-10-CM

## 2012-08-14 DIAGNOSIS — M5430 Sciatica, unspecified side: Secondary | ICD-10-CM

## 2012-08-14 DIAGNOSIS — M545 Low back pain: Secondary | ICD-10-CM

## 2012-08-14 LAB — GLUCOSE, POCT (MANUAL RESULT ENTRY): POC Glucose: 166 mg/dl — AB (ref 70–99)

## 2012-08-14 MED ORDER — CYCLOBENZAPRINE HCL 5 MG PO TABS: 5.0000 mg | ORAL_TABLET | Freq: Three times a day (TID) | ORAL | Status: DC | PRN

## 2012-08-14 MED ORDER — KETOROLAC TROMETHAMINE 60 MG/2ML IM SOLN
60.0000 mg | Freq: Once | INTRAMUSCULAR | Status: AC
  Administered 2012-08-14: 60 mg via INTRAMUSCULAR

## 2012-08-14 NOTE — Patient Instructions (Addendum)
Sciatica Sciatica is pain, weakness, numbness, or tingling along the path of the sciatic nerve. The nerve starts in the lower back and runs down the back of each leg. The nerve controls the muscles in the lower leg and in the back of the knee, while also providing sensation to the back of the thigh, lower leg, and the sole of your foot. Sciatica is a symptom of another medical condition. For instance, nerve damage or certain conditions, such as a herniated disk or bone spur on the spine, pinch or put pressure on the sciatic nerve. This causes the pain, weakness, or other sensations normally associated with sciatica. Generally, sciatica only affects one side of the body. CAUSES   Herniated or slipped disc.  Degenerative disk disease.  A pain disorder involving the narrow muscle in the buttocks (piriformis syndrome).  Pelvic injury or fracture.  Pregnancy.  Tumor (rare). SYMPTOMS  Symptoms can vary from mild to very severe. The symptoms usually travel from the low back to the buttocks and down the back of the leg. Symptoms can include:  Mild tingling or dull aches in the lower back, leg, or hip.  Numbness in the back of the calf or sole of the foot.  Burning sensations in the lower back, leg, or hip.  Sharp pains in the lower back, leg, or hip.  Leg weakness.  Severe back pain inhibiting movement. These symptoms may get worse with coughing, sneezing, laughing, or prolonged sitting or standing. Also, being overweight may worsen symptoms. DIAGNOSIS  Your caregiver will perform a physical exam to look for common symptoms of sciatica. He or she may ask you to do certain movements or activities that would trigger sciatic nerve pain. Other tests may be performed to find the cause of the sciatica. These may include:  Blood tests.  X-rays.  Imaging tests, such as an MRI or CT scan. TREATMENT  Treatment is directed at the cause of the sciatic pain. Sometimes, treatment is not necessary  and the pain and discomfort goes away on its own. If treatment is needed, your caregiver may suggest:  Over-the-counter medicines to relieve pain.  Prescription medicines, such as anti-inflammatory medicine, muscle relaxants, or narcotics.  Applying heat or ice to the painful area.  Steroid injections to lessen pain, irritation, and inflammation around the nerve.  Reducing activity during periods of pain.  Exercising and stretching to strengthen your abdomen and improve flexibility of your spine. Your caregiver may suggest losing weight if the extra weight makes the back pain worse.  Physical therapy.  Surgery to eliminate what is pressing or pinching the nerve, such as a bone spur or part of a herniated disk. HOME CARE INSTRUCTIONS   Only take over-the-counter or prescription medicines for pain or discomfort as directed by your caregiver.  Apply ice to the affected area for 20 minutes, 3 4 times a day for the first 48 72 hours. Then try heat in the same way.  Exercise, stretch, or perform your usual activities if these do not aggravate your pain.  Attend physical therapy sessions as directed by your caregiver.  Keep all follow-up appointments as directed by your caregiver.  Do not wear high heels or shoes that do not provide proper support.  Check your mattress to see if it is too soft. A firm mattress may lessen your pain and discomfort. SEEK IMMEDIATE MEDICAL CARE IF:   You lose control of your bowel or bladder (incontinence).  You have increasing weakness in the lower back,   pelvis, buttocks, or legs.  You have redness or swelling of your back.  You have a burning sensation when you urinate.  You have pain that gets worse when you lie down or awakens you at night.  Your pain is worse than you have experienced in the past.  Your pain is lasting longer than 4 weeks.  You are suddenly losing weight without reason. MAKE SURE YOU:  Understand these  instructions.  Will watch your condition.  Will get help right away if you are not doing well or get worse. Document Released: 06/28/2001 Document Revised: 01/03/2012 Document Reviewed: 11/13/2011 [employer] Patient Information 2013 [employer].  

## 2012-08-14 NOTE — Progress Notes (Signed)
Subjective:    Patient ID: Michelle [name], female    DOB: 09-24-1932, 79 y.o.   MRN: [id number]  HPI 79 year old white female, patient of Dr. Cato Mulligan is in today with complaints of right lower back pain that radiates down her right leg x2 days. Recent pain a 10 out of 10 that is worse with movement. She's been taken Mobic that has not helped her symptoms. She purchased a new mattress today in hopes of having decreased back pain. Denies any numbness or tingling. She has a history of type 2 diabetes. Last hemoglobin A1c was 8.5. Blood sugar this morning was 164.   Review of Systems  Constitutional: Negative.   Respiratory: Negative.   Cardiovascular: Negative.   Musculoskeletal: Positive for back pain.  Skin: Negative.   Neurological: Negative.  Negative for light-headedness and numbness.  Hematological: Negative.   Psychiatric/Behavioral: Negative.    Past Medical History  Diagnosis Date  . COPD (chronic obstructive pulmonary disease)   . CAD (coronary artery disease)     Cardiac cath 1990 with Dr. Riley Kill and [name] No cath since 1990  . Hyperlipidemia   . Hypertension   . Diabetes mellitus   . AV block, 1st degree     History   Social History  . Marital Status: Married    Spouse Name: N/A    Number of Children: 2  . Years of Education: N/A   Occupational History  . Retired    Social History Main Topics  . Smoking status: Former Smoker -- 1.0 packs/day for 55 years    Types: Cigarettes    Quit date: 09/29/2006  . Smokeless tobacco: Not on file  . Alcohol Use: No  . Drug Use: No  . Sexually Active: Not on file   Other Topics Concern  . Not on file   Social History Narrative  . No narrative on file    Past Surgical History  Procedure Date  . Cholecystectomy   . Cataract extraction 2010  . Ptca 1990     Family History  Problem Relation Age of Onset  . Heart attack Mother 66  . Cancer Father 64    Allergies  Allergen Reactions  . Codeine Sulfate     REACTION: nausea  . Morphine Sulfate     REACTION: nausea    Current Outpatient Prescriptions on File Prior to Visit  Medication Sig Dispense Refill  . ACCU-CHEK COMPACT TEST DRUM test strip TEST 3 TIMES DAILY AS DIRECTED.  300 each  3  . albuterol (PROVENTIL) (2.5 MG/3ML) 0.083% nebulizer solution INHALE 1 VIAL USING NEBULIZER FOUR TIMES A DAY AS NEEDED  150 mL  5  . amLODipine (NORVASC) 10 MG tablet Take 1 tablet (10 mg total) by mouth daily.  90 tablet  3  . aspirin 81 MG tablet Take 81 mg by mouth daily.        . CRESTOR 40 MG tablet TAKE ONE TABLET BY MOUTH DAILY  90 tablet  9  . enalapril (VASOTEC) 20 MG tablet TAKE ONE TABLET BY MOUTH TWICE DAILY  180 tablet  3  . fenofibrate micronized (LOFIBRA) 134 MG capsule TAKE ONE CAPSULE BY MOUTH DAILY  30 capsule  5  . fish oil-omega-3 fatty acids 1000 MG capsule Take 2 g by mouth daily.        . Fluticasone-Salmeterol (  ADVAIR DISKUS) 250-50 MCG/DOSE AEPB Inhale 1 puff into the lungs 2 (two) times daily.  60 each  6  . folic acid (FOLVITE) 400 MCG tablet Take 400 mcg by mouth daily.        . furosemide (LASIX) 20 MG tablet Take 2 tablets (40 mg total) by mouth daily.  200 tablet  3  . glimepiride (AMARYL) 2 MG tablet TAKE 1 TABLET (2 MG TOTAL) BY MOUTH 2 (TWO) TIMES DAILY BEFORE A MEAL.  180 tablet  2  . insulin glargine (LANTUS SOLOSTAR) 100 UNIT/ML injection Inject 15 Units into the skin at bedtime.  5 pen  PRN  . meclizine (ANTIVERT) 25 MG tablet TAKE 1 TABLET BY MOUTH TWO TIMES A DAY AS NEEDED  20 tablet  2  . meloxicam (MOBIC) 7.5 MG tablet Take 2 tablets (15 mg total) by mouth daily.  180 tablet  0  . metFORMIN (GLUCOPHAGE) 1000 MG tablet TAKE 1 TABLET BY MOUTH TWO TIMES A DAY  180 tablet  0  . metoprolol-hydrochlorothiazide (LOPRESSOR HCT) 100-25 MG per tablet Take 1 tablet by  mouth daily.  90 tablet  1  . omeprazole (PRILOSEC) 20 MG capsule TAKE ONE CAPSULE BY MOUTH DAILY  90 capsule  0  . potassium chloride SA (K-DUR,KLOR-CON) 20 MEQ tablet Take 1 tablet (20 mEq total) by mouth daily.  90 tablet  1  . potassium chloride SA (K-DUR,KLOR-CON) 20 MEQ tablet TAKE 1 TABLET BY MOUTH ONCE A DAY  90 tablet  0  . PROAIR HFA 108 (90 BASE) MCG/ACT inhaler INHALE 2 PUFFS INTO THE LUNGS EVERY 6 (SIX) HOURS AS NEEDED FOR WHEEZING OR SHORTNESS OF BREATH.  8.5 g  5  . SPIRIVA HANDIHALER 18 MCG inhalation capsule INHALE CONTENTS OF ONE CAPSULE DAILY  30 capsule  3  . vitamin E 400 UNIT capsule Take 400 Units by mouth daily.          BP 160/80  Pulse 90  Temp 97.9 F (36.6 C) (Oral)  Resp 20  Wt 199 lb (90.266 kg)  SpO2 96%chart    Objective:   Physical Exam  Constitutional: She is oriented to person, place, and time. She appears well-developed and well-nourished.  Cardiovascular: Normal rate, regular rhythm and normal heart sounds.   Pulmonary/Chest: Effort normal and breath sounds normal.  Musculoskeletal:       Pain with flexion at the hip-90. Mild pain with lateral patient. No tenderness to palpation of the spinosis process. Negative straight leg raise.  Neurological: She is alert and oriented to person, place, and time. She has normal reflexes. She displays normal reflexes. No cranial nerve deficit. Coordination abnormal.  Skin: Skin is warm and dry.  Psychiatric: She has a normal mood and affect.          Assessment & Plan:  Assessment:  1. Low back pain 2. Sciatica 3. Type 2 diabetes  Plan: Toradol 60 mg IM x1. Flexeril 5 mg one tablet 3 times a day as needed. Continue Mobic. Patient to call the office if symptoms worsen or persist. Recheck a schedule, and as needed.

## 2012-08-16 ENCOUNTER — Other Ambulatory Visit: Payer: Self-pay | Admitting: Internal Medicine

## 2012-08-21 ENCOUNTER — Telehealth: Payer: Self-pay | Admitting: Family

## 2012-08-21 MED ORDER — METHYLPREDNISOLONE 4 MG PO KIT: PACK | ORAL | Status: AC

## 2012-08-21 NOTE — Telephone Encounter (Signed)
Pt states she was up all night with the pain down back into right leg. Pt would like to know if there is another muscle relaxer she could be put on that is stronger. No relief w/ this one. Pharm: [facility]/ [facility] [employer]

## 2012-08-21 NOTE — Telephone Encounter (Signed)
Allergic to codeine. She can come pick up a RX for pain (percocet)  med OR I can call in a low dose prednisone. Let me know what her preference is.

## 2012-08-21 NOTE — Telephone Encounter (Signed)
Pt prefers prednisone. 

## 2012-08-21 NOTE — Telephone Encounter (Signed)
Pt aware.

## 2012-08-21 NOTE — Telephone Encounter (Signed)
Sent to pharmacy 

## 2012-08-30 ENCOUNTER — Other Ambulatory Visit: Payer: Self-pay | Admitting: Internal Medicine

## 2012-09-19 ENCOUNTER — Other Ambulatory Visit: Payer: Self-pay | Admitting: Family

## 2012-09-26 ENCOUNTER — Telehealth: Payer: Self-pay | Admitting: Internal Medicine

## 2012-09-26 NOTE — Telephone Encounter (Signed)
Can use meloxicam 7.5 mg po bid #60/1 refill.  Ok to see another provider Do not take more than prescribed

## 2012-09-26 NOTE — Telephone Encounter (Signed)
Patient Information:  Caller Name: [name]  Phone: [phone]  Patient: Michelle [name], Michelle [name]  Gender: Female  DOB: 09/14/1932  Age: 79 Years  PCP: Birdie Sons (Adults only)  Office Follow Up:  Does the office need to follow up with this patient?: Yes  Instructions For The Office: Please follow up with patient.  Is out of Mobic due to taking it TID rather than BID due to pain.  Increased amount helped a little.  Dr. Cato Mulligan is unavailable for 03/12 or 03/13 which is the only one she wants to see.  If someone is willing to call her in something until she can be seen, she uses [facility] at [phone]  RN Note:  Gradually in pain levels of both knees since first of the year. Rates pain as a 7-10/on pain scale.  Is mobile with walker but moves very slowly. Both knees are swollen but have been chronically.  Patient has taken the Mobic TID versus as ordered BID with some relief.  Currently out of all pain medication for knees.  Uses [facility] [address] [facility] [address] at [phone].  Is able to do daily activities with a rolling walker but very slowly and painful.  Care advice given. Attempted to make appointment with Dr. Cato Mulligan with none available.  Offered appointment for 09/27/12 with Dr. Orvan Falconer but patient declines.  Transferred caller to appointment scheduler for assistance with appointment with Dr. Cato Mulligan.  Symptoms  Reason For Call & Symptoms: Uncontrolled knee pain.  Has been taking generic for Mobic 3 x a day versus  2x daily and is now out of medication.  Son calls requesting a refill for TID or stronger dosage.  She is walking with a walker with pain so bad she can hardly get around.  Reviewed Health History In EMR: Yes  Reviewed Medications In EMR: Yes  Reviewed Allergies In EMR: Yes  Reviewed Surgeries / Procedures: Yes  Date of Onset of Symptoms: Unknown  Treatments Tried: Mobic was taken more than usual to control pain  Treatments Tried Worked: Yes  Guideline(s) Used:  Knee Pain  Disposition Per Guideline:   See Within 3 Days in Office  Reason For Disposition Reached:   Knee pain persists > 7 days  Advice Given:  Knee Pain after Overuse:   Apply Cold to the Area: Apply a cold pack or ice bag (wrapped in a moist towel) to the area for 20 minutes. Repeat in 1 hour, then every 4 hours while awake. Continue this for the first 48 hours after an overuse injury (Reason: reduce the swelling and pain).  Apply Heat to the Area: Beginning 48 hours after an injury, apply a warm washcloth or heating pad for 10 minutes three times a day to help increase blood flow and improve healing.  Rest Your Knee   for the next couple days. Avoid activities that worsen your pain. Reduce activities that put a lot of strain on the knee joint (e.g., deep knee bends, stair climbing, running).  Call Back If:  You become worse.

## 2012-09-27 ENCOUNTER — Ambulatory Visit (INDEPENDENT_AMBULATORY_CARE_PROVIDER_SITE_OTHER): Payer: Medicare Other | Admitting: Family

## 2012-09-27 ENCOUNTER — Encounter: Payer: Self-pay | Admitting: Family

## 2012-09-27 VITALS — BP 120/70 | Temp 97.8°F | Wt 196.0 lb

## 2012-09-27 DIAGNOSIS — M5430 Sciatica, unspecified side: Secondary | ICD-10-CM

## 2012-09-27 DIAGNOSIS — M5431 Sciatica, right side: Secondary | ICD-10-CM

## 2012-09-27 DIAGNOSIS — M1710 Unilateral primary osteoarthritis, unspecified knee: Secondary | ICD-10-CM

## 2012-09-27 DIAGNOSIS — E1149 Type 2 diabetes mellitus with other diabetic neurological complication: Secondary | ICD-10-CM

## 2012-09-27 DIAGNOSIS — M1711 Unilateral primary osteoarthritis, right knee: Secondary | ICD-10-CM

## 2012-09-27 MED ORDER — OXYCODONE-ACETAMINOPHEN 5-325 MG PO TABS: 1.0000 | ORAL_TABLET | Freq: Three times a day (TID) | ORAL | Status: DC | PRN

## 2012-09-27 MED ORDER — MELOXICAM 7.5 MG PO TABS: 15.0000 mg | ORAL_TABLET | Freq: Every day | ORAL | Status: DC

## 2012-09-27 NOTE — Telephone Encounter (Signed)
Pt aware, appt scheduled with Padonda today at 24

## 2012-09-27 NOTE — Patient Instructions (Addendum)
Sciatica Sciatica is pain, weakness, numbness, or tingling along the path of the sciatic nerve. The nerve starts in the lower back and runs down the back of each leg. The nerve controls the muscles in the lower leg and in the back of the knee, while also providing sensation to the back of the thigh, lower leg, and the sole of your foot. Sciatica is a symptom of another medical condition. For instance, nerve damage or certain conditions, such as a herniated disk or bone spur on the spine, pinch or put pressure on the sciatic nerve. This causes the pain, weakness, or other sensations normally associated with sciatica. Generally, sciatica only affects one side of the body. CAUSES   Herniated or slipped disc.  Degenerative disk disease.  A pain disorder involving the narrow muscle in the buttocks (piriformis syndrome).  Pelvic injury or fracture.  Pregnancy.  Tumor (rare). SYMPTOMS  Symptoms can vary from mild to very severe. The symptoms usually travel from the low back to the buttocks and down the back of the leg. Symptoms can include:  Mild tingling or dull aches in the lower back, leg, or hip.  Numbness in the back of the calf or sole of the foot.  Burning sensations in the lower back, leg, or hip.  Sharp pains in the lower back, leg, or hip.  Leg weakness.  Severe back pain inhibiting movement. These symptoms may get worse with coughing, sneezing, laughing, or prolonged sitting or standing. Also, being overweight may worsen symptoms. DIAGNOSIS  Your caregiver will perform a physical exam to look for common symptoms of sciatica. He or she may ask you to do certain movements or activities that would trigger sciatic nerve pain. Other tests may be performed to find the cause of the sciatica. These may include:  Blood tests.  X-rays.  Imaging tests, such as an MRI or CT scan. TREATMENT  Treatment is directed at the cause of the sciatic pain. Sometimes, treatment is not necessary  and the pain and discomfort goes away on its own. If treatment is needed, your caregiver may suggest:  Over-the-counter medicines to relieve pain.  Prescription medicines, such as anti-inflammatory medicine, muscle relaxants, or narcotics.  Applying heat or ice to the painful area.  Steroid injections to lessen pain, irritation, and inflammation around the nerve.  Reducing activity during periods of pain.  Exercising and stretching to strengthen your abdomen and improve flexibility of your spine. Your caregiver may suggest losing weight if the extra weight makes the back pain worse.  Physical therapy.  Surgery to eliminate what is pressing or pinching the nerve, such as a bone spur or part of a herniated disk. HOME CARE INSTRUCTIONS   Only take over-the-counter or prescription medicines for pain or discomfort as directed by your caregiver.  Apply ice to the affected area for 20 minutes, 3 4 times a day for the first 48 72 hours. Then try heat in the same way.  Exercise, stretch, or perform your usual activities if these do not aggravate your pain.  Attend physical therapy sessions as directed by your caregiver.  Keep all follow-up appointments as directed by your caregiver.  Do not wear high heels or shoes that do not provide proper support.  Check your mattress to see if it is too soft. A firm mattress may lessen your pain and discomfort. SEEK IMMEDIATE MEDICAL CARE IF:   You lose control of your bowel or bladder (incontinence).  You have increasing weakness in the lower back,   pelvis, buttocks, or legs.  You have redness or swelling of your back.  You have a burning sensation when you urinate.  You have pain that gets worse when you lie down or awakens you at night.  Your pain is worse than you have experienced in the past.  Your pain is lasting longer than 4 weeks.  You are suddenly losing weight without reason. MAKE SURE YOU:  Understand these  instructions.  Will watch your condition.  Will get help right away if you are not doing well or get worse. Document Released: 06/28/2001 Document Revised: 01/03/2012 Document Reviewed: 11/13/2011 [employer] Patient Information 2013 [employer].  

## 2012-09-27 NOTE — Progress Notes (Signed)
Subjective:    Patient ID: Michelle [name], female    DOB: 1933/04/11, 79 y.o.   MRN: [id number]  HPI 79 year old white female, nonsmoker, patient of Dr. Cato Mulligan is in today with complaints of low back pain that radiates down her right leg. Pain is worse when she walks and with certain movements. Rates the pain a 10 out of 10, described as sharp. She has a history of sciatica in the past and has taken Flexeril and steroids that have been ineffective. She does respond well to Mobic but has been out of the medication. Denies any loss of bowel or bladder control.   Review of Systems  Constitutional: Negative.   Respiratory: Negative.   Cardiovascular: Negative.   Gastrointestinal: Negative.   Genitourinary: Negative.   Musculoskeletal: Positive for back pain.       Radiates down the right leg  Skin: Negative.   Neurological: Negative.   Psychiatric/Behavioral: Negative.    Past Medical History  Diagnosis Date  . COPD (chronic obstructive pulmonary disease)   . CAD (coronary artery disease)     Cardiac cath 1990 with Dr. Riley Kill and [name] No cath since 1990  . Hyperlipidemia   . Hypertension   . Diabetes mellitus   . AV block, 1st degree     History   Social History  . Marital Status: Married    Spouse Name: N/A    Number of Children: 2  . Years of Education: N/A   Occupational History  . Retired    Social History Main Topics  . Smoking status: Former Smoker -- 1.00 packs/day for 55 years    Types: Cigarettes    Quit date: 09/29/2006  . Smokeless tobacco: Not on file  . Alcohol Use: No  . Drug Use: No  . Sexually Active: Not on file   Other Topics Concern  . Not on file   Social History Narrative  . No narrative on file    Past Surgical History  Procedure Laterality Date  . Cholecystectomy    . Cataract  extraction  2010  . Ptca  1990    Family History  Problem Relation Age of Onset  . Heart attack Mother 52  . Cancer Father 78    Allergies  Allergen Reactions  . Codeine Sulfate     REACTION: nausea  . Morphine Sulfate     REACTION: nausea    Current Outpatient Prescriptions on File Prior to Visit  Medication Sig Dispense Refill  . ACCU-CHEK COMPACT TEST DRUM test strip TEST 3 TIMES DAILY AS DIRECTED.  300 each  3  . albuterol (PROVENTIL) (2.5 MG/3ML) 0.083% nebulizer solution INHALE 1 VIAL USING NEBULIZER FOUR TIMES A DAY AS NEEDED  150 mL  5  . amLODipine (NORVASC) 10 MG tablet Take 1 tablet (10 mg total) by mouth daily.  90 tablet  3  . aspirin 81 MG tablet Take 81 mg by mouth daily.        . CRESTOR 40 MG tablet TAKE ONE TABLET BY MOUTH DAILY  90 tablet  9  . cyclobenzaprine (FLEXERIL) 5 MG tablet TAKE 1 TABLET (5 MG TOTAL) BY MOUTH 3 (THREE) TIMES DAILY AS NEEDED FOR MUSCLE SPASMS.  30 tablet  0  . enalapril (VASOTEC) 20 MG tablet TAKE ONE TABLET BY MOUTH TWICE DAILY  180 tablet  2  .  fenofibrate micronized (LOFIBRA) 134 MG capsule TAKE ONE CAPSULE BY MOUTH DAILY  30 capsule  5  . fish oil-omega-3 fatty acids 1000 MG capsule Take 2 g by mouth daily.        . Fluticasone-Salmeterol (ADVAIR DISKUS) 250-50 MCG/DOSE AEPB Inhale 1 puff into the lungs 2 (two) times daily.  60 each  6  . folic acid (FOLVITE) 400 MCG tablet Take 400 mcg by mouth daily.        . furosemide (LASIX) 20 MG tablet Take 2 tablets (40 mg total) by mouth daily.  200 tablet  3  . glimepiride (AMARYL) 2 MG tablet TAKE 1 TABLET (2 MG TOTAL) BY MOUTH 2 (TWO) TIMES DAILY BEFORE A MEAL.  180 tablet  2  . insulin glargine (LANTUS SOLOSTAR) 100 UNIT/ML injection Inject 15 Units into the skin at bedtime.  5 pen  PRN  . meclizine (ANTIVERT) 25 MG tablet TAKE 1 TABLET BY MOUTH TWO TIMES A DAY AS NEEDED  20 tablet  2  . metFORMIN (GLUCOPHAGE) 1000 MG tablet TAKE 1 TABLET BY MOUTH TWO TIMES A DAY  180 tablet  0  .  metoprolol-hydrochlorothiazide (LOPRESSOR HCT) 100-25 MG per tablet Take 1 tablet by mouth daily.  90 tablet  1  . omeprazole (PRILOSEC) 20 MG capsule TAKE ONE CAPSULE BY MOUTH DAILY  90 capsule  0  . potassium chloride SA (K-DUR,KLOR-CON) 20 MEQ tablet Take 1 tablet (20 mEq total) by mouth daily.  90 tablet  1  . PROAIR HFA 108 (90 BASE) MCG/ACT inhaler INHALE 2 PUFFS INTO THE LUNGS EVERY 6 (SIX) HOURS AS NEEDED FOR WHEEZING OR SHORTNESS OF BREATH.  8.5 g  5  . SPIRIVA HANDIHALER 18 MCG inhalation capsule INHALE CONTENTS OF ONE CAPSULE DAILY  30 capsule  2  . vitamin E 400 UNIT capsule Take 400 Units by mouth daily.         No current facility-administered medications on file prior to visit.    BP 120/70  Temp(Src) 97.8 F (36.6 C) (Oral)  Wt 196 lb (88.905 kg)  BMI 32.62 kg/m2chart    Objective:   Physical Exam  Constitutional: She is oriented to person, place, and time. She appears well-developed and well-nourished.  HENT:  Right Ear: External ear normal.  Left Ear: External ear normal.  Nose: Nose normal.  Mouth/Throat: Oropharynx is clear and moist.  Neck: Normal range of motion. Neck supple.  Cardiovascular: Normal rate, regular rhythm and normal heart sounds.   Pulmonary/Chest: Effort normal and breath sounds normal.  Musculoskeletal: Normal range of motion. She exhibits no edema and no tenderness.  Neurological: She is alert and oriented to person, place, and time. She has normal reflexes. She displays normal reflexes. No cranial nerve deficit. Coordination normal.  Skin: Skin is warm and dry.  Psychiatric: She has a normal mood and affect.          Assessment & Plan:  Assessment:  1. Sciatica/lumbar radiculopathy 2. Type 2 diabetes  Plan: Refilled Mobic 7-1/2 mg twice a day to be taken with food. Percocet as needed for pain. Advised patient to consider an MRI since her symptoms are continuing. She will think about it and let us know. Recheck a schedule, and as  needed.

## 2012-10-02 ENCOUNTER — Other Ambulatory Visit: Payer: Self-pay | Admitting: Internal Medicine

## 2012-10-10 ENCOUNTER — Encounter: Payer: Self-pay | Admitting: Internal Medicine

## 2012-10-10 ENCOUNTER — Ambulatory Visit (INDEPENDENT_AMBULATORY_CARE_PROVIDER_SITE_OTHER): Payer: Medicare Other | Admitting: Internal Medicine

## 2012-10-10 VITALS — BP 132/78 | HR 98 | Temp 97.8°F | Wt 196.0 lb

## 2012-10-10 DIAGNOSIS — I10 Essential (primary) hypertension: Secondary | ICD-10-CM

## 2012-10-10 DIAGNOSIS — M545 Low back pain, unspecified: Secondary | ICD-10-CM

## 2012-10-10 DIAGNOSIS — E785 Hyperlipidemia, unspecified: Secondary | ICD-10-CM

## 2012-10-10 DIAGNOSIS — E119 Type 2 diabetes mellitus without complications: Secondary | ICD-10-CM

## 2012-10-10 DIAGNOSIS — M533 Sacrococcygeal disorders, not elsewhere classified: Secondary | ICD-10-CM

## 2012-10-10 DIAGNOSIS — I2510 Atherosclerotic heart disease of native coronary artery without angina pectoris: Secondary | ICD-10-CM

## 2012-10-10 LAB — HEPATIC FUNCTION PANEL
ALT: 23 U/L (ref 0–35)
Bilirubin, Direct: 0 mg/dL (ref 0.0–0.3)
Total Bilirubin: 0.4 mg/dL (ref 0.3–1.2)

## 2012-10-10 LAB — LIPID PANEL
Cholesterol: 141 mg/dL (ref 0–200)
HDL: 37.7 mg/dL — ABNORMAL LOW (ref >39.00)
Triglycerides: 522 mg/dL — ABNORMAL HIGH (ref 0.0–149.0)
VLDL: 104.4 mg/dL — ABNORMAL HIGH (ref 0.0–40.0)

## 2012-10-10 LAB — LDL CHOLESTEROL, DIRECT: Direct LDL: 44.8 mg/dL

## 2012-10-10 LAB — BASIC METABOLIC PANEL
Chloride: 101 mEq/L (ref 96–112)
Creatinine, Ser: 0.9 mg/dL (ref 0.4–1.2)

## 2012-10-10 MED ORDER — OXYCODONE-ACETAMINOPHEN 5-325 MG PO TABS: 1.0000 | ORAL_TABLET | Freq: Three times a day (TID) | ORAL | Status: DC | PRN

## 2012-10-10 NOTE — Assessment & Plan Note (Signed)
Back pain > 3 months- can be severe (in wheelchair today)  Needs mri

## 2012-10-11 LAB — MICROALBUMIN / CREATININE URINE RATIO
Creatinine,U: 56.1 mg/dL
Microalb Creat Ratio: 1.1 mg/g (ref 0.0–30.0)

## 2012-10-11 NOTE — Progress Notes (Signed)
Patient ID: Michelle [name], female   DOB: Nov 26, 1932, 79 y.o.   MRN: [id number] Patient comes in with her son to discuss back pain. She continues to have back pain with radiation of pain to her right leg. She denies any weakness. Pain can keep her awake at night. Pain does not seem to worsen with activity. She does note some pain relief with rest. I reviewed previous notes related to her back pain and sciatica.  Reviewed past medical history, medications, social history.  Review of systems: Patient has chronic fatigue. She denies any chest pain, shortness breath, PND. She denies any lower extremity weakness. She denies any rashes. Appetite is normal. No other specific complaints.  Examination: Overweight female in no acute distress. Neck is supple. Chest clear to auscultation cardiac exam S1-S2 are regular. Abdominal exam overweight, and bowel sounds, soft. Extremities no edema. Neurologic exam she is alert. She is sitting in a wheelchair but is able to get up from a wheelchair. Deep tendon reflexes at the knees are I bilaterally diminished. They are absent at the ankle.

## 2012-10-17 ENCOUNTER — Other Ambulatory Visit: Payer: Self-pay | Admitting: Internal Medicine

## 2012-10-19 ENCOUNTER — Telehealth: Payer: Self-pay | Admitting: Internal Medicine

## 2012-10-19 ENCOUNTER — Ambulatory Visit
Admission: RE | Admit: 2012-10-19 | Discharge: 2012-10-19 | Disposition: A | Discharge status: Home / Self Care | Payer: Medicare Other | Source: Ambulatory Visit | Attending: Internal Medicine | Admitting: Internal Medicine

## 2012-10-19 DIAGNOSIS — M545 Low back pain: Secondary | ICD-10-CM

## 2012-10-19 MED ORDER — DIAZEPAM 5 MG PO TABS: ORAL_TABLET | ORAL | Status: DC

## 2012-10-19 NOTE — Telephone Encounter (Signed)
Pt son call and mother could not finished MRI. Pt son is requesting something to take edge off ?valium call into pharm

## 2012-10-19 NOTE — Telephone Encounter (Signed)
Valium 5 mg 1 hour prior to mri, can repeat at time of MRI

## 2012-10-19 NOTE — Telephone Encounter (Signed)
rx called in, pts son aware

## 2012-10-26 ENCOUNTER — Telehealth: Payer: Self-pay | Admitting: Internal Medicine

## 2012-10-26 NOTE — Telephone Encounter (Signed)
Patient Information:  Caller Name: Michelle [name]  Phone: [phone]  Patient: Michelle [name], Michelle [name]  Gender: Female  DOB: Feb 07, 1933  Age: 79 Years  PCP: Birdie Sons (Adults only)  Office Follow Up:  Does the office need to follow up with this patient?: Yes  Instructions For The Office: Rx if possible  RN Note:  Back pain that goes down her leg. Patient has a history of sciatic nerve and is scheduled for an MRI on 11/01/12. Patient is eating, drinking, and voiding within normal limits. Patient has been resting today. Caller states patient could walk if she had to. Pateint is out of pain medication and would like a refill if possible until her MRI.  Symptoms  Reason For Call & Symptoms: back pain  Reviewed Health History In EMR: Yes  Reviewed Medications In EMR: Yes  Reviewed Allergies In EMR: Yes  Reviewed Surgeries / Procedures: Yes  Date of Onset of Symptoms: 10/26/2012  Guideline(s) Used:  Back Pain  Disposition Per Guideline:   See Today in Office  Reason For Disposition Reached:   Pain radiates into the thigh or further down the leg, and in both legs  Advice Given:  Activity  Keep doing your day-to-day activities if it is not too painful. Staying active is better than resting.  Avoid anything that makes your pain worse. Avoid heavy lifting, twisting, and too much exercise until your back heals.  You do not need to stay in bed.  Cold or Heat:  Heat Pack: If pain lasts over 2 days, apply heat to the sore area. Use a heat pack, heating pad, or warm wet washcloth. Do this for 10 minutes, then as needed. For widespread stiffness, take a hot bath or hot shower instead. Move the sore area under the warm water.  Reassurance:  Twisting or heavy lifting can cause back pain.  With treatment, the pain most often goes away in 1-2 weeks.  You can treat most back pain at home.  Here is some care advice that should help.  Sleep:  Sleep on your side with a pillow between your knees. If you  sleep on your back, put a pillow under your knees.  Avoid sleeping on your stomach.  Your mattress should be firm. Avoid waterbeds.  Call Back If:  Numbness or weakness occur  Bowel/bladder problems occur  Pain lasts for more than 2 weeks  You become worse.  Patient Refused Recommendation:  Patient Requests Prescription  Patient would like a refill on Roxicet until MRI if possible. Does not want to come in for another office visit.

## 2012-10-26 NOTE — Telephone Encounter (Signed)
Dr Cato Mulligan is not in the office.  Per Dr Artist Pais ok to call in Hydrocodone 5/325mg  1 q8h prn #30 no refills rx called in to pharmacy, son aware

## 2012-10-30 ENCOUNTER — Other Ambulatory Visit: Payer: Self-pay | Admitting: *Deleted

## 2012-10-30 MED ORDER — TIOTROPIUM BROMIDE MONOHYDRATE 18 MCG IN CAPS: ORAL_CAPSULE | RESPIRATORY_TRACT | Status: DC

## 2012-11-01 ENCOUNTER — Ambulatory Visit: Admission: RE | Admit: 2012-11-01 | Payer: Medicare Other | Source: Ambulatory Visit

## 2012-11-06 ENCOUNTER — Other Ambulatory Visit: Payer: Self-pay | Admitting: Internal Medicine

## 2012-11-25 ENCOUNTER — Other Ambulatory Visit: Payer: Self-pay | Admitting: Internal Medicine

## 2012-11-29 ENCOUNTER — Other Ambulatory Visit: Payer: Self-pay | Admitting: Internal Medicine

## 2013-01-03 ENCOUNTER — Other Ambulatory Visit: Payer: Self-pay | Admitting: Family

## 2013-01-24 ENCOUNTER — Other Ambulatory Visit: Payer: Self-pay | Admitting: Internal Medicine

## 2013-02-01 ENCOUNTER — Other Ambulatory Visit: Payer: Self-pay | Admitting: Internal Medicine

## 2013-02-06 ENCOUNTER — Other Ambulatory Visit: Payer: Self-pay | Admitting: Internal Medicine

## 2013-03-05 ENCOUNTER — Other Ambulatory Visit: Payer: Self-pay | Admitting: Internal Medicine

## 2013-04-02 ENCOUNTER — Other Ambulatory Visit: Payer: Self-pay | Admitting: Internal Medicine

## 2013-04-09 ENCOUNTER — Other Ambulatory Visit: Payer: Self-pay | Admitting: Family

## 2013-05-01 ENCOUNTER — Other Ambulatory Visit: Payer: Self-pay | Admitting: Internal Medicine

## 2013-05-06 ENCOUNTER — Other Ambulatory Visit: Payer: Self-pay | Admitting: Internal Medicine

## 2013-05-29 ENCOUNTER — Other Ambulatory Visit: Payer: Self-pay | Admitting: Internal Medicine

## 2013-07-08 ENCOUNTER — Ambulatory Visit (INDEPENDENT_AMBULATORY_CARE_PROVIDER_SITE_OTHER): Payer: Medicare Other | Admitting: Family

## 2013-07-08 ENCOUNTER — Other Ambulatory Visit: Payer: Self-pay | Admitting: Internal Medicine

## 2013-07-08 ENCOUNTER — Other Ambulatory Visit: Payer: Self-pay | Admitting: Family

## 2013-07-08 ENCOUNTER — Encounter: Payer: Self-pay | Admitting: Family

## 2013-07-08 VITALS — BP 130/62 | HR 81 | Wt 189.0 lb

## 2013-07-08 DIAGNOSIS — I10 Essential (primary) hypertension: Secondary | ICD-10-CM

## 2013-07-08 DIAGNOSIS — IMO0001 Reserved for inherently not codable concepts without codable children: Secondary | ICD-10-CM

## 2013-07-08 DIAGNOSIS — E7800 Pure hypercholesterolemia, unspecified: Secondary | ICD-10-CM

## 2013-07-08 DIAGNOSIS — E1165 Type 2 diabetes mellitus with hyperglycemia: Secondary | ICD-10-CM

## 2013-07-08 DIAGNOSIS — Z1231 Encounter for screening mammogram for malignant neoplasm of breast: Secondary | ICD-10-CM

## 2013-07-08 LAB — MICROALBUMIN / CREATININE URINE RATIO
Microalb Creat Ratio: 0.4 mg/g (ref 0.0–30.0)
Microalb, Ur: 0.2 mg/dL (ref 0.0–1.9)

## 2013-07-08 LAB — CBC WITH DIFFERENTIAL/PLATELET
Basophils Absolute: 0 10*3/uL (ref 0.0–0.1)
Eosinophils Relative: 4.7 % (ref 0.0–5.0)
HCT: 36.7 % (ref 36.0–46.0)
Hemoglobin: 12.3 g/dL (ref 12.0–15.0)
Lymphocytes Relative: 39.5 % (ref 12.0–46.0)
Lymphs Abs: 2.8 10*3/uL (ref 0.7–4.0)
Monocytes Relative: 8.9 % (ref 3.0–12.0)
Neutro Abs: 3.2 10*3/uL (ref 1.4–7.7)
Platelets: 289 10*3/uL (ref 150.0–400.0)
RDW: 14.2 % (ref 11.5–14.6)
WBC: 7 10*3/uL (ref 4.5–10.5)

## 2013-07-08 LAB — HEPATIC FUNCTION PANEL
Albumin: 4.3 g/dL (ref 3.5–5.2)
Total Protein: 7.3 g/dL (ref 6.0–8.3)

## 2013-07-08 LAB — BASIC METABOLIC PANEL
BUN: 30 mg/dL — ABNORMAL HIGH (ref 6–23)
CO2: 27 mEq/L (ref 19–32)
Calcium: 9.7 mg/dL (ref 8.4–10.5)
GFR: 49.43 mL/min — ABNORMAL LOW (ref >60.00)
Glucose, Bld: 163 mg/dL — ABNORMAL HIGH (ref 70–99)

## 2013-07-08 MED ORDER — TRAMADOL HCL 50 MG PO TABS: 50.0000 mg | ORAL_TABLET | Freq: Three times a day (TID) | ORAL | Status: DC | PRN

## 2013-07-08 NOTE — Progress Notes (Signed)
Subjective:    Patient ID: Michelle [name], female    DOB: 1932/09/15, 79 y.o.   MRN: [id number]  HPI 79 year old white female, nonsmoker, Mr. Michelle [name] is in today for recheck of type 2 diabetes, hypertension, hyperlipidemia, coronary artery disease, and COPD. She reports she is doing well. Blood sugars are between 100-120 fasting in the morning. Is tolerating all of her medications well.   Review of Systems  Constitutional: Negative.   HENT: Negative.   Respiratory: Negative.   Cardiovascular: Negative.   Gastrointestinal: Negative.   Endocrine: Negative.   Genitourinary: Negative.   Musculoskeletal: Negative.   Skin: Negative.   Neurological: Negative.   Hematological: Negative.   Psychiatric/Behavioral: Negative.    Past Medical History  Diagnosis Date  . COPD (chronic obstructive pulmonary disease)   . CAD (coronary artery disease)     Cardiac cath 1990 with Dr. Riley [name] and [name] No cath since 1990  . Hyperlipidemia   . Hypertension   . Diabetes mellitus   . AV block, 1st degree     History   Social History  . Marital Status: Married    Spouse Name: N/A    Number of Children: 2  . Years of Education: N/A   Occupational History  . Retired    Social History Main Topics  . Smoking status: Former Smoker -- 1.00 packs/day for 55 years    Types: Cigarettes    Quit date: 09/29/2006  . Smokeless tobacco: Not on file  . Alcohol Use: No  . Drug Use: No  . Sexual Activity: Not on file   Other Topics Concern  . Not on file   Social History Narrative  . No narrative on file    Past Surgical History  Procedure Laterality Date  . Cholecystectomy    . Cataract extraction  2010  . Ptca  1990    Family History  Problem Relation Age of Onset  . Heart attack Mother 45  . Cancer Father 23    Allergies    Allergen Reactions  . Codeine Sulfate     REACTION: nausea  . Morphine Sulfate     REACTION: nausea    Current Outpatient Prescriptions on File Prior to Visit  Medication Sig Dispense Refill  . ACCU-CHEK COMPACT PLUS test strip TEST 3 TIMES DAILY AS DIRECTED.  204 each  2  . ADVAIR DISKUS 250-50 MCG/DOSE AEPB INHALE 1 PUFF INTO THE LUNGS 2 (TWO) TIMES DAILY.  60 each  5  . albuterol (PROVENTIL) (2.5 MG/3ML) 0.083% nebulizer solution INHALE 1 VIAL USING NEBULIZER FOUR TIMES A DAY AS NEEDED  150 mL  4  . amLODipine (NORVASC) 10 MG tablet TAKE 1 TABLET (10 MG TOTAL) BY MOUTH DAILY.  90 tablet  2  . aspirin 81 MG tablet Take 81 mg by mouth daily.        . B-D ULTRAFINE III SHORT PEN 31G X 8 MM MISC USE WITH LANTUS SOLOSTAR  100 each  2  . CRESTOR 40 MG tablet TAKE 1 TABLET BY MOUTH DAILY  90 tablet  8  . cyclobenzaprine (FLEXERIL) 5 MG tablet TAKE 1 TABLET (5 MG TOTAL) BY MOUTH 3 (THREE) TIMES DAILY AS NEEDED FOR MUSCLE SPASMS.  30 tablet  0  . diazepam (VALIUM) 5 MG tablet Take 1 tablet 1 hour prior to MRI and  1 tablet at time of MRI  2 tablet  0  . enalapril (VASOTEC) 20 MG tablet TAKE ONE TABLET BY MOUTH TWICE DAILY  180 tablet  1  . fenofibrate micronized (LOFIBRA) 134 MG capsule TAKE ONE CAPSULE BY MOUTH DAILY  30 capsule  3  . fish oil-omega-3 fatty acids 1000 MG capsule Take 2 g by mouth daily.        . folic acid (FOLVITE) 400 MCG tablet Take 400 mcg by mouth daily.        . furosemide (LASIX) 20 MG tablet TAKE 2 TABLETS (40 MG TOTAL) BY MOUTH DAILY.  180 tablet  2  . glimepiride (AMARYL) 2 MG tablet TAKE 1 TABLET (2 MG TOTAL) BY MOUTH 2 (TWO) TIMES DAILY BEFORE A MEAL.  180 tablet  1  . LANTUS SOLOSTAR 100 UNIT/ML SOPN INJECT 15 UNITS INTO THE SKIN AT BEDTIME.  15 mL  98  . meclizine (ANTIVERT) 25 MG tablet TAKE 1 TABLET BY MOUTH TWO TIMES A DAY AS NEEDED  20 tablet  2  . meloxicam (MOBIC) 7.5 MG tablet TAKE 2 TABLETS (15 MG TOTAL) BY MOUTH DAILY.  180 tablet  1  . metFORMIN  (GLUCOPHAGE) 1000 MG tablet TAKE 1 TABLET BY MOUTH TWO TIMES A DAY  60 tablet  3  . metoprolol-hydrochlorothiazide (LOPRESSOR HCT) 100-25 MG per tablet TAKE 1 TABLET BY MOUTH DAILY.  90 tablet  0  . metoprolol-hydrochlorothiazide (LOPRESSOR HCT) 100-25 MG per tablet TAKE 1 TABLET BY MOUTH DAILY.  90 tablet  0  . omeprazole (PRILOSEC) 20 MG capsule TAKE ONE CAPSULE BY MOUTH DAILY  90 capsule  1  . oxyCODONE-acetaminophen (ROXICET) 5-325 MG per tablet Take 1 tablet by mouth every 8 (eight) hours as needed for pain.  30 tablet  0  . potassium chloride SA (K-DUR,KLOR-CON) 20 MEQ tablet TAKE 1 TABLET BY MOUTH ONCE A DAY  90 tablet  1  . PROAIR HFA 108 (90 BASE) MCG/ACT inhaler INHALE 2 PUFFS INTO THE LUNGS EVERY 6 (SIX) HOURS AS NEEDED FOR WHEEZING OR SHORTNESS OF BREATH.  8.5 g  5  . SPIRIVA HANDIHALER 18 MCG inhalation capsule INHALE CONTENTS OF ONE CAPSULE DAILY  30 capsule  4  . vitamin E 400 UNIT capsule Take 400 Units by mouth daily.         No current facility-administered medications on file prior to visit.    BP 130/62  Pulse 81  Wt 189 lb (85.73 kg)chart    Objective:   Physical Exam  Constitutional: She is oriented to person, place, and time. She appears well-developed and well-nourished.  HENT:  Right Ear: External ear normal.  Left Ear: External ear normal.  Nose: Nose normal.  Mouth/Throat: Oropharynx is clear and moist.  Neck: Normal range of motion.  Cardiovascular: Normal rate, regular rhythm and normal heart sounds.   Pulmonary/Chest: Effort normal and breath sounds normal.  Abdominal: Soft. Bowel sounds are normal.  Musculoskeletal: Normal range of motion.  Neurological: She is alert and oriented to person, place, and time. She has normal reflexes.  Skin: Skin is warm and dry.  Psychiatric: She has a normal mood and affect.          Assessment & Plan:  Assessment: 1. Type 2 diabetes 2. hyperlipidemia 3. Coronary artery disease 4. COPD  Plan: Continue  current medications. Encouraged healthy diet, exercise, monthly breast exams. Refer for mammogram screening. Last sent today, will notify patient of results. Followup in 3-4 months and sooner  if needed.

## 2013-07-08 NOTE — Patient Instructions (Signed)

## 2013-07-08 NOTE — Progress Notes (Signed)
Pre visit review using our clinic review tool, if applicable. No additional management support is needed unless otherwise documented below in the visit note. 

## 2013-07-19 ENCOUNTER — Other Ambulatory Visit: Payer: Self-pay | Admitting: Internal Medicine

## 2013-07-19 ENCOUNTER — Other Ambulatory Visit: Payer: Self-pay | Admitting: Family

## 2013-07-20 ENCOUNTER — Other Ambulatory Visit: Payer: Self-pay | Admitting: Family

## 2013-07-23 ENCOUNTER — Telehealth: Payer: Self-pay | Admitting: Internal Medicine

## 2013-07-23 NOTE — Telephone Encounter (Signed)
Padonda, this was rx'd by you.  Can she have a refill on this?

## 2013-07-23 NOTE — Telephone Encounter (Signed)
yes

## 2013-07-23 NOTE — Telephone Encounter (Addendum)
Pt request traMADol (ULTRAM) 50 MG tablet Pt saw padonda on 12/22 for annual. [facility]/ [employer]

## 2013-07-24 MED ORDER — TRAMADOL HCL 50 MG PO TABS: 50.0000 mg | ORAL_TABLET | Freq: Three times a day (TID) | ORAL | Status: DC | PRN

## 2013-07-24 NOTE — Telephone Encounter (Signed)
rx called in

## 2013-08-07 ENCOUNTER — Other Ambulatory Visit: Payer: Self-pay | Admitting: Internal Medicine

## 2013-08-07 ENCOUNTER — Other Ambulatory Visit: Payer: Self-pay | Admitting: Family

## 2013-08-27 ENCOUNTER — Telehealth: Payer: Self-pay | Admitting: Internal Medicine

## 2013-08-27 NOTE — Telephone Encounter (Signed)
Pt son is calling because pt was given tramadol  50 mg #30. W/o refills. Pt is taking med every 8 hrs and needs more than 30 pills a month. Please call [facility] [address]

## 2013-08-28 NOTE — Telephone Encounter (Signed)
This was originally rx'd by [place].  Please advise

## 2013-08-29 ENCOUNTER — Other Ambulatory Visit: Payer: Self-pay | Admitting: Family

## 2013-08-30 MED ORDER — TRAMADOL HCL 50 MG PO TABS: 50.0000 mg | ORAL_TABLET | Freq: Three times a day (TID) | ORAL | Status: DC | PRN

## 2013-08-30 NOTE — Telephone Encounter (Signed)
Rx phoned in and pt's son aware

## 2013-08-30 NOTE — Telephone Encounter (Addendum)
Son calling to fu on request for rx traMADol (ULTRAM) 50 MG tablet  Pt only given 30 pills and is taking 2-3 /day. Now the pharm will not fill this rx b/c it is too soon. Pt needs a new rx for at least 90 @ 3 day Pt is out and would like something before the weekend. [facility] [address] Please advise

## 2013-08-31 ENCOUNTER — Other Ambulatory Visit: Payer: Self-pay | Admitting: Internal Medicine

## 2013-08-31 NOTE — Telephone Encounter (Signed)
Ok to refill 

## 2013-09-30 ENCOUNTER — Other Ambulatory Visit: Payer: Self-pay | Admitting: Internal Medicine

## 2013-10-22 ENCOUNTER — Ambulatory Visit: Payer: Self-pay | Admitting: Internal Medicine

## 2013-10-22 ENCOUNTER — Encounter: Payer: Self-pay | Admitting: Internal Medicine

## 2013-10-22 ENCOUNTER — Telehealth: Payer: Self-pay | Admitting: Internal Medicine

## 2013-10-22 ENCOUNTER — Ambulatory Visit (INDEPENDENT_AMBULATORY_CARE_PROVIDER_SITE_OTHER): Payer: Commercial Managed Care - HMO | Admitting: Internal Medicine

## 2013-10-22 VITALS — BP 120/70 | HR 106 | Temp 98.1°F | Resp 22 | Ht 65.0 in | Wt 184.0 lb

## 2013-10-22 DIAGNOSIS — I10 Essential (primary) hypertension: Secondary | ICD-10-CM | POA: Diagnosis not present

## 2013-10-22 DIAGNOSIS — E119 Type 2 diabetes mellitus without complications: Secondary | ICD-10-CM | POA: Diagnosis not present

## 2013-10-22 DIAGNOSIS — J4489 Other specified chronic obstructive pulmonary disease: Secondary | ICD-10-CM

## 2013-10-22 DIAGNOSIS — J449 Chronic obstructive pulmonary disease, unspecified: Secondary | ICD-10-CM | POA: Diagnosis not present

## 2013-10-22 MED ORDER — AZITHROMYCIN 250 MG PO TABS: ORAL_TABLET | ORAL | Status: DC

## 2013-10-22 NOTE — Telephone Encounter (Signed)
Pt is on schedule for [facility] in [place] for today 10/22/13.

## 2013-10-22 NOTE — Progress Notes (Signed)
Subjective:    Patient ID: Michelle [name], female    DOB: 09/17/1932, 80 y.o.   MRN: [id number]  HPI 80 year old patient who has a history of hypertension, diabetes, and COPD.  3 days ago, she began feeling unwell.  The past 2 days.  She has had a worsening sore throat, cough, chest congestion, and wheezing.  She has been using her home nebulizer treatment on a regular basis.  She continues on her maintenance medications of Advair and Spiriva.  No documented fever. She has maintained very nice glycemic control  Past Medical History  Diagnosis Date  . COPD (chronic obstructive pulmonary disease)   . CAD (coronary artery disease)     Cardiac cath 1990 with Dr. Lia Foyer and [name] No cath since 1990  . Hyperlipidemia   . Hypertension   . Diabetes mellitus   . AV block, 1st degree     History   Social History  . Marital Status: Married    Spouse Name: N/A    Number of Children: 2  . Years of Education: N/A   Occupational History  . Retired    Social History Main Topics  . Smoking status: Former Smoker -- 1.00 packs/day for 55 years    Types: Cigarettes    Quit date: 09/29/2006  . Smokeless tobacco: Not on file  . Alcohol Use: No  . Drug Use: No  . Sexual Activity: Not on file   Other Topics Concern  . Not on file   Social History Narrative  . No narrative on file    Past Surgical History  Procedure Laterality Date  . Cholecystectomy    . Cataract extraction  2010  . Ptca  1990    Family History  Problem Relation Age of Onset  . Heart attack Mother 26  . Cancer Father 2    Allergies  Allergen Reactions  . Codeine Sulfate     REACTION: nausea  . Morphine Sulfate     REACTION: nausea    Current Outpatient Prescriptions on File Prior to Visit  Medication Sig Dispense Refill  . ACCU-CHEK COMPACT PLUS  test strip TEST 3 TIMES DAILY AS DIRECTED.  204 each  2  . ADVAIR DISKUS 250-50 MCG/DOSE AEPB INHALE 1 PUFF INTO THE LUNGS 2 (TWO) TIMES DAILY.  60 each  5  . albuterol (PROVENTIL) (2.5 MG/3ML) 0.083% nebulizer solution INHALE 1 VIAL USING NEBULIZER FOUR TIMES A DAY AS NEEDED  150 mL  4  . amLODipine (NORVASC) 10 MG tablet TAKE 1 TABLET (10 MG TOTAL) BY MOUTH DAILY.  90 tablet  1  . aspirin 81 MG tablet Take 81 mg by mouth daily.        . B-D ULTRAFINE III SHORT PEN 31G X 8 MM MISC USE WITH LANTUS SOLOSTAR  100 each  2  . CRESTOR 40 MG tablet TAKE 1 TABLET BY MOUTH DAILY  90 tablet  8  . enalapril (VASOTEC) 20 MG tablet TAKE ONE TABLET BY MOUTH TWICE DAILY  180 tablet  1  . fenofibrate micronized (LOFIBRA) 134 MG capsule TAKE ONE CAPSULE BY MOUTH DAILY  30 capsule  2  . fish oil-omega-3 fatty acids 1000 MG capsule Take 2 g by mouth daily.        . folic acid (FOLVITE) 272 MCG tablet Take 400 mcg by mouth  daily.        . furosemide (LASIX) 20 MG tablet TAKE 2 TABLETS (40 MG TOTAL) BY MOUTH DAILY.  180 tablet  2  . glimepiride (AMARYL) 2 MG tablet TAKE 1 TABLET (2 MG TOTAL) BY MOUTH 2 (TWO) TIMES DAILY BEFORE A MEAL.  180 tablet  1  . LANTUS SOLOSTAR 100 UNIT/ML SOPN INJECT 15 UNITS INTO THE SKIN AT BEDTIME.  15 mL  98  . meclizine (ANTIVERT) 25 MG tablet TAKE 1 TABLET BY MOUTH TWO TIMES A DAY AS NEEDED  20 tablet  2  . meloxicam (MOBIC) 7.5 MG tablet TAKE 2 TABLETS (15 MG TOTAL) BY MOUTH DAILY.  180 tablet  0  . metFORMIN (GLUCOPHAGE) 1000 MG tablet TAKE 1 TABLET BY MOUTH TWO TIMES A DAY  60 tablet  2  . metoprolol-hydrochlorothiazide (LOPRESSOR HCT) 100-25 MG per tablet TAKE 1 TABLET BY MOUTH DAILY.  90 tablet  1  . omeprazole (PRILOSEC) 20 MG capsule TAKE ONE CAPSULE BY MOUTH DAILY  90 capsule  1  . potassium chloride SA (K-DUR,KLOR-CON) 20 MEQ tablet TAKE 1 TABLET BY MOUTH ONCE A DAY  90 tablet  1  . PROAIR HFA 108 (90 BASE) MCG/ACT inhaler INHALE 2 PUFFS INTO THE LUNGS EVERY 6 (SIX) HOURS AS  NEEDED FOR WHEEZING OR SHORTNESS OF BREATH.  8.5 g  5  . SPIRIVA HANDIHALER 18 MCG inhalation capsule INHALE CONTENTS OF ONE CAPSULE DAILY  30 capsule  4  . traMADol (ULTRAM) 50 MG tablet Take 1 tablet (50 mg total) by mouth every 8 (eight) hours as needed.  90 tablet  1  . vitamin E 400 UNIT capsule Take 400 Units by mouth daily.         No current facility-administered medications on file prior to visit.    BP 120/70  Pulse 106  Temp(Src) 98.1 F (36.7 C) (Oral)  Resp 22  Ht 5\' 5"  (1.651 m)  Wt 184 lb (83.462 kg)  BMI 30.62 kg/m2  SpO2 95%       Review of Systems  Constitutional: Positive for fatigue.  HENT: Positive for sore throat. Negative for congestion, dental problem, hearing loss, rhinorrhea, sinus pressure and tinnitus.   Eyes: Negative for pain, discharge and visual disturbance.  Respiratory: Positive for cough, shortness of breath and wheezing.   Cardiovascular: Negative for chest pain, palpitations and leg swelling.  Gastrointestinal: Negative for nausea, vomiting, abdominal pain, diarrhea, constipation, blood in stool and abdominal distention.  Genitourinary: Negative for dysuria, urgency, frequency, hematuria, flank pain, vaginal bleeding, vaginal discharge, difficulty urinating, vaginal pain and pelvic pain.  Musculoskeletal: Negative for arthralgias, gait problem and joint swelling.  Skin: Negative for rash.  Neurological: Positive for weakness. Negative for dizziness, syncope, speech difficulty, numbness and headaches.  Hematological: Negative for adenopathy.  Psychiatric/Behavioral: Negative for behavioral problems, dysphoric mood and agitation. The patient is not nervous/anxious.        Objective:   Physical Exam  Constitutional: She is oriented to person, place, and time. She appears well-developed and well-nourished.  HENT:  Head: Normocephalic.  Right Ear: External ear normal.  Left Ear: External ear normal.  Oropharynx erythematous  Eyes:  Conjunctivae and EOM are normal. Pupils are equal, round, and reactive to light.  Neck: Normal range of motion. Neck supple. No thyromegaly present.  Cardiovascular: Normal rate, regular rhythm, normal heart sounds and intact distal pulses.   Pulmonary/Chest: Effort normal. No respiratory distress. She has wheezes.  Scattered coarse rhonchi and faint wheezing  Abdominal: Soft. Bowel sounds are normal. She exhibits no mass. There is no tenderness.  Musculoskeletal: Normal range of motion.  Lymphadenopathy:    She has no cervical adenopathy.  Neurological: She is alert and oriented to person, place, and time.  Skin: Skin is warm and dry. No rash noted.  Psychiatric: She has a normal mood and affect. Her behavior is normal.          Assessment & Plan:   COPD exacerbation.  We'll continue on her maintenance medications.  Will chew with azithromycin expectorants, and dextromethorphan Hypertension stable  diabetes mellitus, stable

## 2013-10-22 NOTE — Progress Notes (Signed)
Pre-visit discussion using our clinic review tool. No additional management support is needed unless otherwise documented below in the visit note.  

## 2013-10-22 NOTE — Telephone Encounter (Signed)
Patient Information:  Caller Name: Shanon Brow  Phone: [phone]  Patient: Michelle [name], Michelle [name]  Gender: Female  DOB: Jun 11, 1933  Age: 80 Years  PCP: Phoebe Sharps (Adults only)  Office Follow Up:  Does the office need to follow up with this patient?: No  Instructions For The Office: N/A   Symptoms  Reason For Call & Symptoms: Son calling for an appt for pt's severe hoarseness and sore throat.  Called and spoke with pt. Only able to whisper. Able to swallow and breath normally. Not otherwise ill. Drinking well and making good urine.  Reviewed Health History In EMR: Yes  Reviewed Medications In EMR: Yes  Reviewed Allergies In EMR: Yes  Reviewed Surgeries / Procedures: Yes  Date of Onset of Symptoms: 10/21/2013  Treatments Tried: Took Nyquil per package directions.  Treatments Tried Worked: No  Guideline(s) Used:  Sore Throat  Disposition Per Guideline:   See Today in Office  Reason For Disposition Reached:   Severe sore throat pain  Advice Given:  N/A  Patient Will Follow Care Advice:  YES  Appointment Scheduled:  10/22/2013 14:30:00 Appointment Scheduled Provider: Ambrose Pancoast [facility] [place]. No appt at [facility], [place], or [place].

## 2013-10-22 NOTE — Patient Instructions (Signed)
Take your antibiotic as prescribed until ALL of it is gone, but stop if you develop a rash, swelling, or any side effects of the medication.  Contact our office as soon as possible if  there are side effects of the medication.    Take over-the-counter expectorants and cough medications such as  Mucinex DM.  Call if there is no improvement in 5 to 7 days or if he developed worsening cough, fever, or new symptoms, such as shortness of breath or chest pain.

## 2013-10-23 ENCOUNTER — Telehealth: Payer: Self-pay | Admitting: Internal Medicine

## 2013-10-23 NOTE — Telephone Encounter (Signed)
Relevant patient education mailed to patient.  

## 2013-10-31 ENCOUNTER — Other Ambulatory Visit: Payer: Self-pay | Admitting: Internal Medicine

## 2013-11-02 ENCOUNTER — Other Ambulatory Visit: Payer: Self-pay | Admitting: Internal Medicine

## 2013-11-14 ENCOUNTER — Other Ambulatory Visit: Payer: Self-pay | Admitting: Internal Medicine

## 2013-11-15 LAB — HM DIABETES EYE EXAM

## 2013-11-20 ENCOUNTER — Other Ambulatory Visit: Payer: Self-pay | Admitting: Internal Medicine

## 2013-11-24 ENCOUNTER — Other Ambulatory Visit: Payer: Self-pay | Admitting: Internal Medicine

## 2013-11-29 ENCOUNTER — Other Ambulatory Visit: Payer: Self-pay | Admitting: Internal Medicine

## 2013-12-02 ENCOUNTER — Other Ambulatory Visit: Payer: Self-pay | Admitting: Internal Medicine

## 2014-01-03 ENCOUNTER — Other Ambulatory Visit: Payer: Self-pay | Admitting: Internal Medicine

## 2014-01-07 ENCOUNTER — Ambulatory Visit (INDEPENDENT_AMBULATORY_CARE_PROVIDER_SITE_OTHER): Payer: Medicare HMO | Admitting: Internal Medicine

## 2014-01-07 ENCOUNTER — Encounter: Payer: Self-pay | Admitting: Internal Medicine

## 2014-01-07 VITALS — BP 122/72 | HR 72 | Temp 97.8°F | Ht 65.0 in | Wt 181.0 lb

## 2014-01-07 DIAGNOSIS — E119 Type 2 diabetes mellitus without complications: Secondary | ICD-10-CM

## 2014-01-07 LAB — HEPATIC FUNCTION PANEL
ALT: 16 U/L (ref 0–35)
AST: 22 U/L (ref 0–37)
Albumin: 4.1 g/dL (ref 3.5–5.2)
Alkaline Phosphatase: 50 U/L (ref 39–117)
BILIRUBIN DIRECT: 0.1 mg/dL (ref 0.0–0.3)
Total Bilirubin: 0.5 mg/dL (ref 0.2–1.2)
Total Protein: 7.1 g/dL (ref 6.0–8.3)

## 2014-01-07 LAB — LIPID PANEL
CHOL/HDL RATIO: 4
Cholesterol: 153 mg/dL (ref 0–200)
HDL: 40.2 mg/dL (ref >39.00)
NONHDL: 112.8
VLDL: 119 mg/dL — AB (ref 0.0–40.0)

## 2014-01-07 LAB — BASIC METABOLIC PANEL
BUN: 29 mg/dL — ABNORMAL HIGH (ref 6–23)
CALCIUM: 9.6 mg/dL (ref 8.4–10.5)
CHLORIDE: 101 meq/L (ref 96–112)
CO2: 30 mEq/L (ref 19–32)
Creatinine, Ser: 1.2 mg/dL (ref 0.4–1.2)
GFR: 44.35 mL/min — AB (ref >60.00)
Glucose, Bld: 149 mg/dL — ABNORMAL HIGH (ref 70–99)
Potassium: 3.8 mEq/L (ref 3.5–5.1)
Sodium: 139 mEq/L (ref 135–145)

## 2014-01-07 LAB — MICROALBUMIN / CREATININE URINE RATIO
Creatinine,U: 44.8 mg/dL
Microalb Creat Ratio: 1.6 mg/g (ref 0.0–30.0)
Microalb, Ur: 0.7 mg/dL (ref 0.0–1.9)

## 2014-01-07 LAB — HEMOGLOBIN A1C: Hgb A1c MFr Bld: 6.8 % — ABNORMAL HIGH (ref 4.6–6.5)

## 2014-01-07 NOTE — Progress Notes (Signed)
Pre visit review using our clinic review tool, if applicable. No additional management support is needed unless otherwise documented below in the visit note. 

## 2014-01-08 ENCOUNTER — Other Ambulatory Visit: Payer: Self-pay | Admitting: Internal Medicine

## 2014-01-08 LAB — HM DIABETES FOOT EXAM

## 2014-01-09 NOTE — Progress Notes (Signed)
patient comes in for followup of multiple medical problems including type 2 diabetes, hyperlipidemia, hypertension. The patient does not check blood sugar or blood pressure at home. The patetient does not follow an exercise or diet program. The patient denies any polyuria, polydipsia.  In the past the patient has gone to diabetic treatment center. The patient is tolerating medications  Without difficulty. The patient does admit to medication compliance.  Past Medical History  Diagnosis Date  . COPD (chronic obstructive pulmonary disease)   . CAD (coronary artery disease)     Cardiac cath 1990 with Dr. Lia Foyer and [name] No cath since 1990  . Hyperlipidemia   . Hypertension   . Diabetes mellitus   . AV block, 1st degree     History   Social History  . Marital Status: Married    Spouse Name: N/A    Number of Children: 2  . Years of Education: N/A   Occupational History  . Retired    Social History Main Topics  . Smoking status: Former Smoker -- 1.00 packs/day for 55 years    Types: Cigarettes    Quit date: 09/29/2006  . Smokeless tobacco: Not on file  . Alcohol Use: No  . Drug Use: No  . Sexual Activity: Not on file   Other Topics Concern  . Not on file   Social History Narrative  . No narrative on file    Past Surgical History  Procedure Laterality Date  . Cholecystectomy    . Cataract extraction  2010  . Ptca  1990    Family History  Problem Relation Age of Onset  . Heart attack Mother 21  . Cancer Father 76    Allergies  Allergen Reactions  . Codeine Sulfate     REACTION: nausea  . Morphine Sulfate     REACTION: nausea    Current Outpatient Prescriptions on File Prior to Visit  Medication Sig Dispense Refill  . ACCU-CHEK COMPACT PLUS test strip TEST 3 TIMES DAILY AS DIRECTED.  204 each  2  . ADVAIR  DISKUS 250-50 MCG/DOSE AEPB INHALE 1 PUFF INTO THE LUNGS 2 (TWO) TIMES DAILY.  60 each  5  . albuterol (PROVENTIL) (2.5 MG/3ML) 0.083% nebulizer solution INHALE 1 VIAL USING NEBULIZER FOUR TIMES A DAY AS NEEDED  150 mL  3  . amLODipine (NORVASC) 10 MG tablet TAKE 1 TABLET (10 MG TOTAL) BY MOUTH DAILY.  90 tablet  1  . aspirin 81 MG tablet Take 81 mg by mouth daily.        . B-D ULTRAFINE III SHORT PEN 31G X 8 MM MISC USE WITH LANTUS SOLOSTAR  100 each  2  . CRESTOR 40 MG tablet TAKE 1 TABLET BY MOUTH DAILY  90 tablet  8  . enalapril (VASOTEC) 20 MG tablet TAKE ONE TABLET BY MOUTH TWICE DAILY  180 tablet  0  . fenofibrate micronized (LOFIBRA) 134 MG capsule TAKE ONE CAPSULE BY MOUTH DAILY  30 capsule  0  . fish oil-omega-3 fatty acids 1000 MG capsule Take 2 g by mouth daily.        . folic acid (FOLVITE) 322 MCG tablet Take 400 mcg by mouth daily.        . furosemide (LASIX) 20 MG tablet TAKE 2 TABLETS (40 MG TOTAL) BY MOUTH DAILY.  180 tablet  1  . glimepiride (AMARYL) 2 MG tablet TAKE 1 TABLET (2 MG TOTAL) BY MOUTH 2 (TWO) TIMES DAILY BEFORE A MEAL.  180 tablet  0  . LANTUS SOLOSTAR 100 UNIT/ML SOPN INJECT 15 UNITS INTO THE SKIN AT BEDTIME.  15 mL  98  . meclizine (ANTIVERT) 25 MG tablet TAKE 1 TABLET BY MOUTH TWO TIMES A DAY AS NEEDED  20 tablet  2  . meloxicam (MOBIC) 7.5 MG tablet TAKE 2 TABLETS (15 MG TOTAL) BY MOUTH DAILY.  60 tablet  0  . metFORMIN (GLUCOPHAGE) 1000 MG tablet TAKE 1 TABLET BY MOUTH TWO TIMES A DAY  60 tablet  2  . metoprolol-hydrochlorothiazide (LOPRESSOR HCT) 100-25 MG per tablet TAKE 1 TABLET BY MOUTH DAILY.  90 tablet  1  . omeprazole (PRILOSEC) 20 MG capsule TAKE ONE CAPSULE BY MOUTH DAILY  90 capsule  0  . Potassium Chloride ER 20 MEQ TBCR TAKE 1 TABLET BY MOUTH ONCE A DAY  90 tablet  0  . traMADol (ULTRAM) 50 MG tablet TAKE 1 TABLET BY MOUTH EVERY 8 HOURS AS NEEDED  90 tablet  1  . vitamin E 400 UNIT capsule Take 400 Units by mouth daily.         No current  facility-administered medications on file prior to visit.     patient denies chest pain, shortness of breath, orthopnea. Denies lower extremity edema, abdominal pain, change in appetite, change in bowel movements. Patient denies rashes, musculoskeletal complaints. No other specific complaints in a complete review of systems.   BP 122/72  Pulse 72  Temp(Src) 97.8 F (36.6 C) (Oral)  Ht 5\' 5"  (1.651 m)  Wt 181 lb (82.101 kg)  BMI 30.12 kg/m2  Well-developed well-nourished female in no acute distress. HEENT exam atraumatic, normocephalic, extraocular muscles are intact. Neck is supple. No jugular venous distention no thyromegaly. Chest clear to auscultation without increased work of breathing. Cardiac exam S1 and S2 are regular. Abdominal exam active bowel sounds, soft, nontender. Extremities no edema. Neurologic exam she is alert without any motor sensory deficits. Gait is normal.  DIABETES MELLITUS, TYPE II She needs followup labs. We'll check A1c, lipid panel, liver panel, urine micro-albumen. Discussed need for aggressive weight loss. Goal weight should result in a BMI less than 25.   Lab Results  Component Value Date   HGBA1C 6.8* 01/07/2014   Lab Results  Component Value Date   CHOL 153 01/07/2014   HDL 40.20 01/07/2014   LDLDIRECT 44.8 10/10/2012   TRIG 595.0 Triglyceride is over 400; calculations on Lipids are invalid.* 01/07/2014   CHOLHDL 4 01/07/2014

## 2014-01-09 NOTE — Assessment & Plan Note (Signed)
She needs followup labs. We'll check A1c, lipid panel, liver panel, urine micro-albumen. Discussed need for aggressive weight loss. Goal weight should result in a BMI less than 25.

## 2014-01-13 ENCOUNTER — Ambulatory Visit (INDEPENDENT_AMBULATORY_CARE_PROVIDER_SITE_OTHER): Payer: Medicare HMO | Admitting: Family Medicine

## 2014-01-13 ENCOUNTER — Encounter: Payer: Self-pay | Admitting: Family Medicine

## 2014-01-13 ENCOUNTER — Telehealth: Payer: Self-pay | Admitting: Internal Medicine

## 2014-01-13 VITALS — BP 120/72 | HR 74 | Temp 97.7°F | Ht 65.0 in | Wt 180.0 lb

## 2014-01-13 DIAGNOSIS — J069 Acute upper respiratory infection, unspecified: Secondary | ICD-10-CM

## 2014-01-13 NOTE — Progress Notes (Signed)
No chief complaint on file.   HPI:  -started: yesterday -symptoms:nasal congestion, blowing nose -denies:fever, SOB, cough, NVD, tooth pain -has tried: hot steam, vaporizer, used afrin today and feels better -sick contacts/travel/risks: denies flu exposure, tick exposure or or Ebola risks -Hx of: COPD on spiriva, advair and prn albuterol  ROS: See pertinent positives and negatives per HPI.  Past Medical History  Diagnosis Date  . COPD (chronic obstructive pulmonary disease)   . CAD (coronary artery disease)     Cardiac cath 1990 with Dr. Lia Foyer and [name] No cath since 1990  . Hyperlipidemia   . Hypertension   . Diabetes mellitus   . AV block, 1st degree     Past Surgical History  Procedure Laterality Date  . Cholecystectomy    . Cataract extraction  2010  . Ptca  1990    Family History  Problem Relation Age of Onset  . Heart attack Mother 55  . Cancer Father 28    History   Social History  . Marital Status: Married    Spouse Name: N/A    Number of Children: 2  . Years of Education: N/A   Occupational History  . Retired    Social History Main Topics  . Smoking status: Former Smoker -- 1.00 packs/day for 55 years    Types: Cigarettes    Quit date: 09/29/2006  . Smokeless tobacco: None  . Alcohol Use: No  . Drug Use: No  . Sexual Activity: None   Other Topics Concern  . None   Social History Narrative  . None    Current outpatient prescriptions:ACCU-CHEK COMPACT PLUS test strip, TEST 3 TIMES DAILY AS DIRECTED., Disp: 204 each, Rfl: 2;  ADVAIR DISKUS 250-50 MCG/DOSE AEPB, INHALE 1 PUFF INTO THE LUNGS 2 (TWO) TIMES DAILY., Disp: 60 each, Rfl: 5;  albuterol (PROVENTIL) (2.5 MG/3ML) 0.083% nebulizer solution, INHALE 1 VIAL USING NEBULIZER FOUR TIMES A DAY AS NEEDED, Disp: 150 mL, Rfl: 3 amLODipine  (NORVASC) 10 MG tablet, TAKE 1 TABLET (10 MG TOTAL) BY MOUTH DAILY., Disp: 90 tablet, Rfl: 1;  aspirin 81 MG tablet, Take 81 mg by mouth daily.  , Disp: , Rfl: ;  B-D ULTRAFINE III SHORT PEN 31G X 8 MM MISC, USE WITH LANTUS SOLOSTAR, Disp: 100 each, Rfl: 2;  CRESTOR 40 MG tablet, TAKE 1 TABLET BY MOUTH DAILY, Disp: 90 tablet, Rfl: 8;  enalapril (VASOTEC) 20 MG tablet, TAKE ONE TABLET BY MOUTH TWICE DAILY, Disp: 180 tablet, Rfl: 0 fenofibrate micronized (LOFIBRA) 134 MG capsule, TAKE ONE CAPSULE BY MOUTH DAILY, Disp: 30 capsule, Rfl: 0;  fish oil-omega-3 fatty acids 1000 MG capsule, Take 2 g by mouth daily.  , Disp: , Rfl: ;  folic acid (FOLVITE) 607 MCG tablet, Take 400 mcg by mouth daily.  , Disp: , Rfl: ;  furosemide (LASIX) 20 MG tablet, TAKE 2 TABLETS (40 MG TOTAL) BY MOUTH DAILY., Disp: 180 tablet, Rfl: 1 glimepiride (AMARYL) 2 MG tablet, TAKE 1 TABLET (2 MG TOTAL) BY MOUTH 2 (TWO) TIMES DAILY BEFORE A MEAL., Disp: 180 tablet, Rfl: 0;  LANTUS SOLOSTAR 100 UNIT/ML SOPN, INJECT 15 UNITS INTO THE SKIN AT BEDTIME., Disp: 15 mL, Rfl: 98;  meclizine (ANTIVERT) 25 MG tablet, TAKE 1 TABLET BY MOUTH TWO TIMES A DAY AS NEEDED, Disp: 20 tablet, Rfl: 2;  meloxicam (MOBIC)  7.5 MG tablet, TAKE 2 TABLETS (15 MG TOTAL) BY MOUTH DAILY., Disp: 60 tablet, Rfl: 0 metFORMIN (GLUCOPHAGE) 1000 MG tablet, TAKE 1 TABLET BY MOUTH TWO TIMES A DAY, Disp: 60 tablet, Rfl: 2;  metoprolol-hydrochlorothiazide (LOPRESSOR HCT) 100-25 MG per tablet, TAKE 1 TABLET BY MOUTH DAILY., Disp: 90 tablet, Rfl: 1;  omeprazole (PRILOSEC) 20 MG capsule, TAKE ONE CAPSULE BY MOUTH DAILY, Disp: 90 capsule, Rfl: 0;  Potassium Chloride ER 20 MEQ TBCR, TAKE 1 TABLET BY MOUTH ONCE A DAY, Disp: 90 tablet, Rfl: 0 SPIRIVA HANDIHALER 18 MCG inhalation capsule, INHALE CONTENTS OF ONE CAPSULE DAILY, Disp: 30 capsule, Rfl: 11;  traMADol (ULTRAM) 50 MG tablet, TAKE 1 TABLET BY MOUTH EVERY 8 HOURS AS NEEDED, Disp: 90 tablet, Rfl: 1;  vitamin E 400 UNIT capsule, Take 400  Units by mouth daily.  , Disp: , Rfl:   EXAM:  Filed Vitals:   01/13/14 1359  BP: 120/72  Pulse: 74  Temp: 97.7 F (36.5 C)    Body mass index is 29.95 kg/(m^2).  GENERAL: vitals reviewed and listed above, alert, oriented, appears well hydrated and in no acute distress  HEENT: atraumatic, conjunttiva clear, no obvious abnormalities on inspection of external nose and ears, normal appearance of ear canals and TMs, clear nasal congestion, mild post oropharyngeal erythema with PND, no tonsillar edema or exudate, no sinus TTP  NECK: no obvious masses on inspection  LUNGS: clear to auscultation bilaterally, no wheezes, rales or rhonchi, good air movement  CV: HRRR, no peripheral edema  MS: moves all extremities without noticeable abnormality  PSYCH: pleasant and cooperative, no obvious depression or anxiety  ASSESSMENT AND PLAN:  Discussed the following assessment and plan:  Upper respiratory infection  -given HPI and exam findings today, a serious infection or illness is unlikely. We discussed potential etiologies, with VURI being most likely, and advised supportive care and monitoring. We discussed treatment side effects, likely course, antibiotic misuse, transmission, and signs of developing a serious illness. -of course, we advised to return or notify a doctor immediately if symptoms worsen or persist or new concerns arise.    Patient Instructions  INSTRUCTIONS FOR UPPER RESPIRATORY INFECTION:  -plenty of rest and fluids  -nasal saline wash 2-3 times daily (use prepackaged nasal saline or bottled/distilled water if making your own)   -can use the ARRIN short term nasal spray for drainage and nasal congestion - but do NOT use longer then 3-4 days  -in the winter time, using a humidifier at night is helpful (please follow cleaning instructions)  -if you are taking a cough medication - use only as directed, may also try a teaspoon of honey to coat the throat and throat  lozenges  -for sore throat, salt water gargles can help  -follow up if you have fevers, facial pain, tooth pain, difficulty breathing or are worsening or not getting better in 5-7 days      KIM, HANNAH R.

## 2014-01-13 NOTE — Telephone Encounter (Signed)
Noted  

## 2014-01-13 NOTE — Progress Notes (Signed)
Pre visit review using our clinic review tool, if applicable. No additional management support is needed unless otherwise documented below in the visit note. 

## 2014-01-13 NOTE — Patient Instructions (Signed)
INSTRUCTIONS FOR UPPER RESPIRATORY INFECTION:  -plenty of rest and fluids  -nasal saline wash 2-3 times daily (use prepackaged nasal saline or bottled/distilled water if making your own)   -can use the ARRIN short term nasal spray for drainage and nasal congestion - but do NOT use longer then 3-4 days  -in the winter time, using a humidifier at night is helpful (please follow cleaning instructions)  -if you are taking a cough medication - use only as directed, may also try a teaspoon of honey to coat the throat and throat lozenges  -for sore throat, salt water gargles can help  -follow up if you have fevers, facial pain, tooth pain, difficulty breathing or are worsening or not getting better in 5-7 days

## 2014-01-13 NOTE — Telephone Encounter (Signed)
Patient Information:  Caller Name: Aruna  Phone: [phone]  Patient: Michelle [name], Michelle [name]  Gender: Female  DOB: 1932-08-14  Age: 80 Years  PCP: Phoebe Sharps (Adults only, leaving end of July 2015)  Office Follow Up:  Does the office need to follow up with this patient?: No  Instructions For The Office: N/A   Symptoms  Reason For Call & Symptoms: Patient calling about stuffy nose that interferes with sleep.  Reports she sat up breathing mist from hot water in a cup during the night. She speaks in full sentences and does not sound short of breath.  Reviewed Health History In EMR: Yes  Reviewed Medications In EMR: Yes  Reviewed Allergies In EMR: Yes  Reviewed Surgeries / Procedures: Yes  Date of Onset of Symptoms: 01/12/2014  Treatments Tried: Breathing warm mist  Treatments Tried Worked: Yes  Guideline(s) Used:  Colds  Disposition Per Guideline:   See Today or Tomorrow in Office  Reason For Disposition Reached:   Patient wants to be seen  Advice Given:  For a Stuffy Nose - Use Nasal Washes:  Introduction: Saline (salt water) nasal irrigation (nasal wash) is an effective and simple home remedy for treating stuffy nose and sinus congestion. The nose can be irrigated by pouring, spraying, or squirting salt water into the nose and then letting it run back out.  How it Helps: The salt water rinses out excess mucus, washes out any irritants (dust, allergens) that might be present, and moistens the nasal cavity.  Treatment for Associated Symptoms of Colds:  Hydrate: Drink adequate liquids.  Humidifier:  If the air in your home is dry, use a cool-mist humidifier  Call Back If:  Difficulty breathing occurs  You become worse  Patient Will Follow Care Advice:  YES  Appointment Scheduled:  01/13/2014 14:00:00 Appointment Scheduled Provider:  Maudie Mercury (TEXT 1st, after 20 mins can call), [facility])

## 2014-01-14 ENCOUNTER — Emergency Department (HOSPITAL_COMMUNITY): Payer: Medicare HMO

## 2014-01-14 ENCOUNTER — Encounter (HOSPITAL_COMMUNITY): Payer: Self-pay | Admitting: Emergency Medicine

## 2014-01-14 ENCOUNTER — Other Ambulatory Visit: Payer: Self-pay | Admitting: Internal Medicine

## 2014-01-14 DIAGNOSIS — J4489 Other specified chronic obstructive pulmonary disease: Secondary | ICD-10-CM | POA: Insufficient documentation

## 2014-01-14 DIAGNOSIS — I440 Atrioventricular block, first degree: Secondary | ICD-10-CM | POA: Insufficient documentation

## 2014-01-14 DIAGNOSIS — E119 Type 2 diabetes mellitus without complications: Secondary | ICD-10-CM | POA: Insufficient documentation

## 2014-01-14 DIAGNOSIS — I2510 Atherosclerotic heart disease of native coronary artery without angina pectoris: Secondary | ICD-10-CM | POA: Insufficient documentation

## 2014-01-14 DIAGNOSIS — J449 Chronic obstructive pulmonary disease, unspecified: Secondary | ICD-10-CM | POA: Insufficient documentation

## 2014-01-14 DIAGNOSIS — I10 Essential (primary) hypertension: Secondary | ICD-10-CM | POA: Insufficient documentation

## 2014-01-14 DIAGNOSIS — J3489 Other specified disorders of nose and nasal sinuses: Secondary | ICD-10-CM | POA: Insufficient documentation

## 2014-01-14 DIAGNOSIS — Z791 Long term (current) use of non-steroidal anti-inflammatories (NSAID): Secondary | ICD-10-CM | POA: Insufficient documentation

## 2014-01-14 DIAGNOSIS — Z9861 Coronary angioplasty status: Secondary | ICD-10-CM | POA: Insufficient documentation

## 2014-01-14 DIAGNOSIS — Z794 Long term (current) use of insulin: Secondary | ICD-10-CM | POA: Insufficient documentation

## 2014-01-14 DIAGNOSIS — IMO0002 Reserved for concepts with insufficient information to code with codable children: Secondary | ICD-10-CM | POA: Insufficient documentation

## 2014-01-14 DIAGNOSIS — Z79899 Other long term (current) drug therapy: Secondary | ICD-10-CM | POA: Insufficient documentation

## 2014-01-14 DIAGNOSIS — Z87891 Personal history of nicotine dependence: Secondary | ICD-10-CM | POA: Insufficient documentation

## 2014-01-14 DIAGNOSIS — E785 Hyperlipidemia, unspecified: Secondary | ICD-10-CM | POA: Insufficient documentation

## 2014-01-14 DIAGNOSIS — Z7982 Long term (current) use of aspirin: Secondary | ICD-10-CM | POA: Insufficient documentation

## 2014-01-14 LAB — BASIC METABOLIC PANEL
BUN: 30 mg/dL — AB (ref 6–23)
CALCIUM: 10.1 mg/dL (ref 8.4–10.5)
CO2: 22 mEq/L (ref 19–32)
CREATININE: 1.33 mg/dL — AB (ref 0.50–1.10)
Chloride: 95 mEq/L — ABNORMAL LOW (ref 96–112)
GFR, EST AFRICAN AMERICAN: 43 mL/min — AB (ref >90)
GFR, EST NON AFRICAN AMERICAN: 37 mL/min — AB (ref >90)
GLUCOSE: 119 mg/dL — AB (ref 70–99)
POTASSIUM: 3.1 meq/L — AB (ref 3.7–5.3)
Sodium: 137 mEq/L (ref 137–147)

## 2014-01-14 LAB — CBC
HEMATOCRIT: 35 % — AB (ref 36.0–46.0)
Hemoglobin: 11.6 g/dL — ABNORMAL LOW (ref 12.0–15.0)
MCH: 26.2 pg (ref 26.0–34.0)
MCHC: 33.1 g/dL (ref 30.0–36.0)
MCV: 79.2 fL (ref 78.0–100.0)
Platelets: 246 10*3/uL (ref 150–400)
RBC: 4.42 MIL/uL (ref 3.87–5.11)
RDW: 14.1 % (ref 11.5–15.5)
WBC: 8.3 10*3/uL (ref 4.0–10.5)

## 2014-01-14 LAB — I-STAT TROPONIN, ED: Troponin i, poc: 0 ng/mL (ref 0.00–0.08)

## 2014-01-14 IMAGING — CR DG CHEST 2V
2 series · 2 of 2 positions shown · non-contrast
Comparison: [DATE]

CLINICAL DATA: Shortness of breath

EXAM:
CHEST  2 VIEW

[w chest pa *]
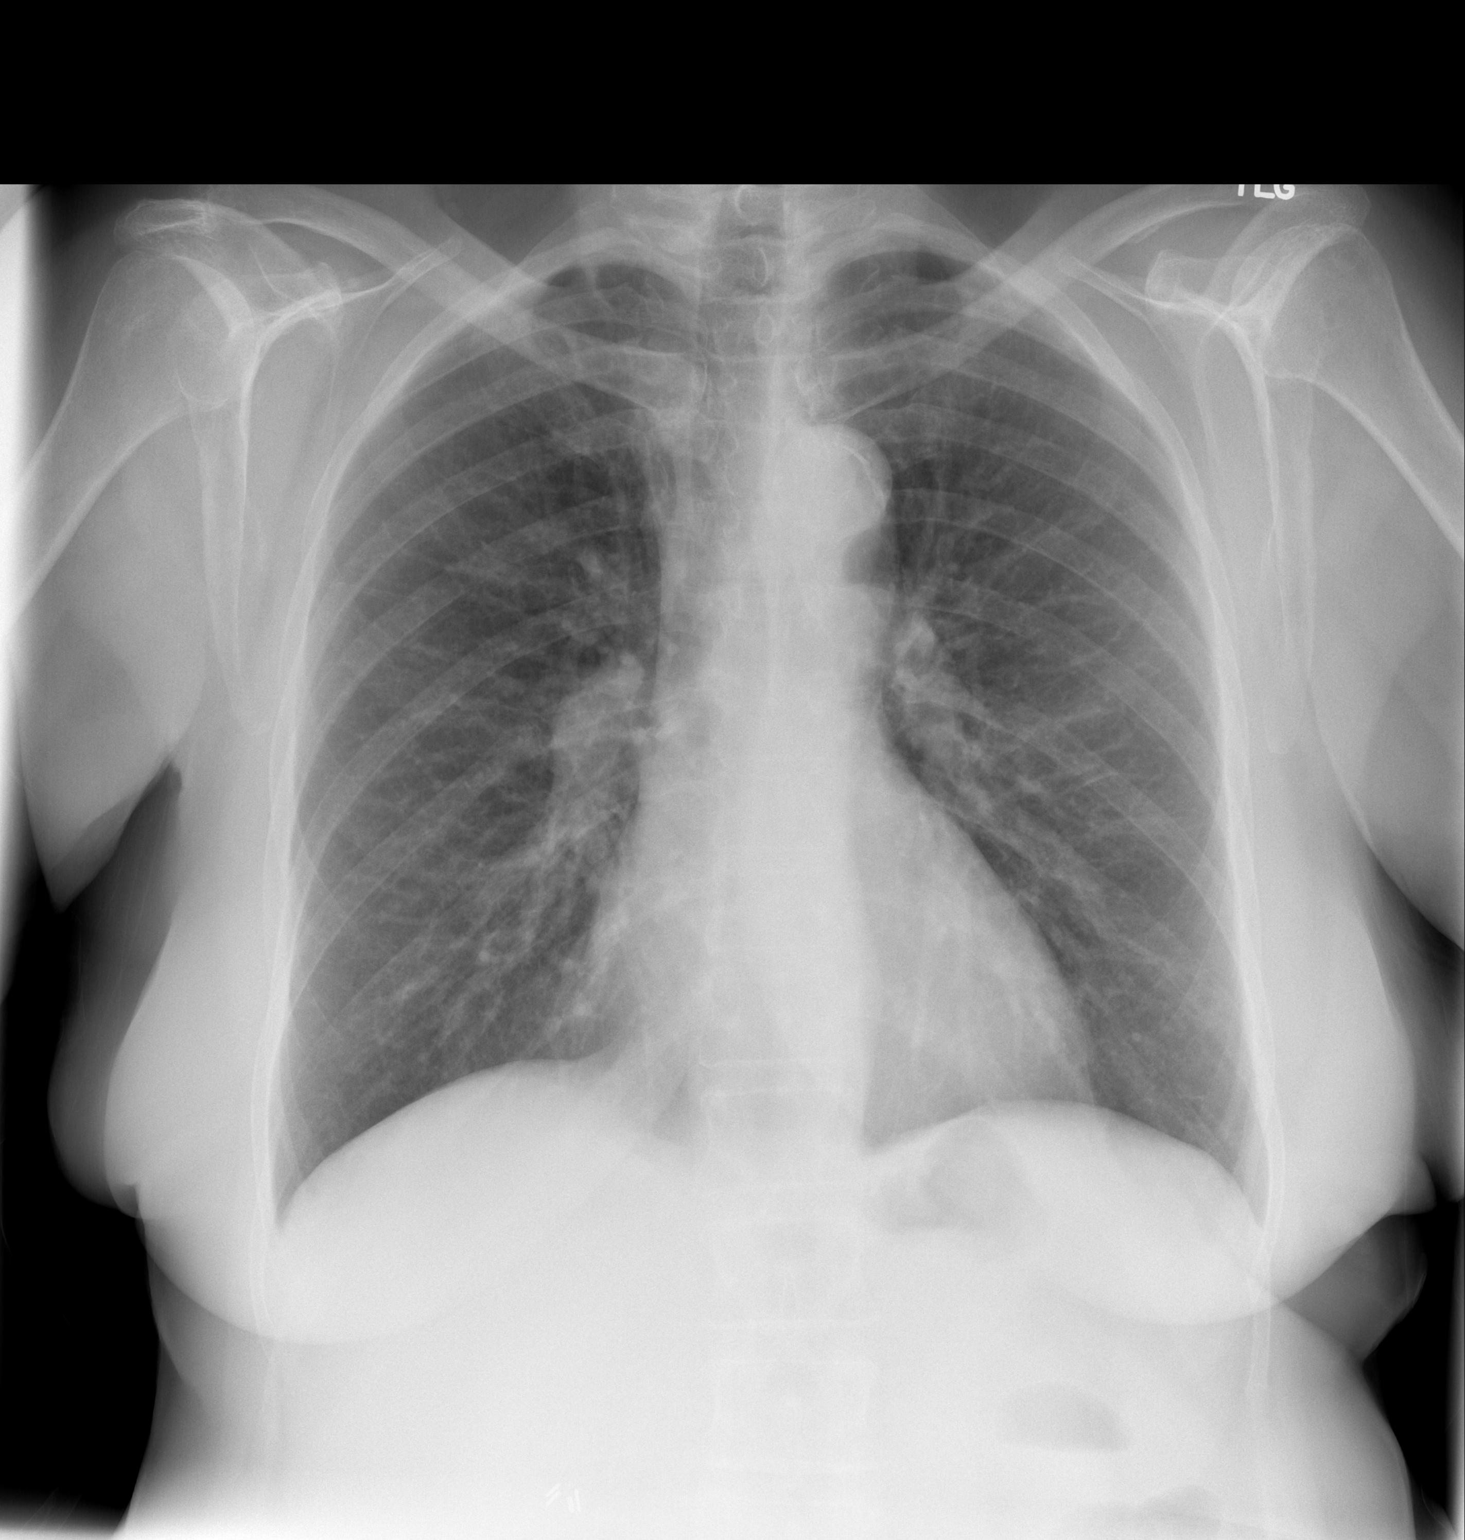

[w chest lat]
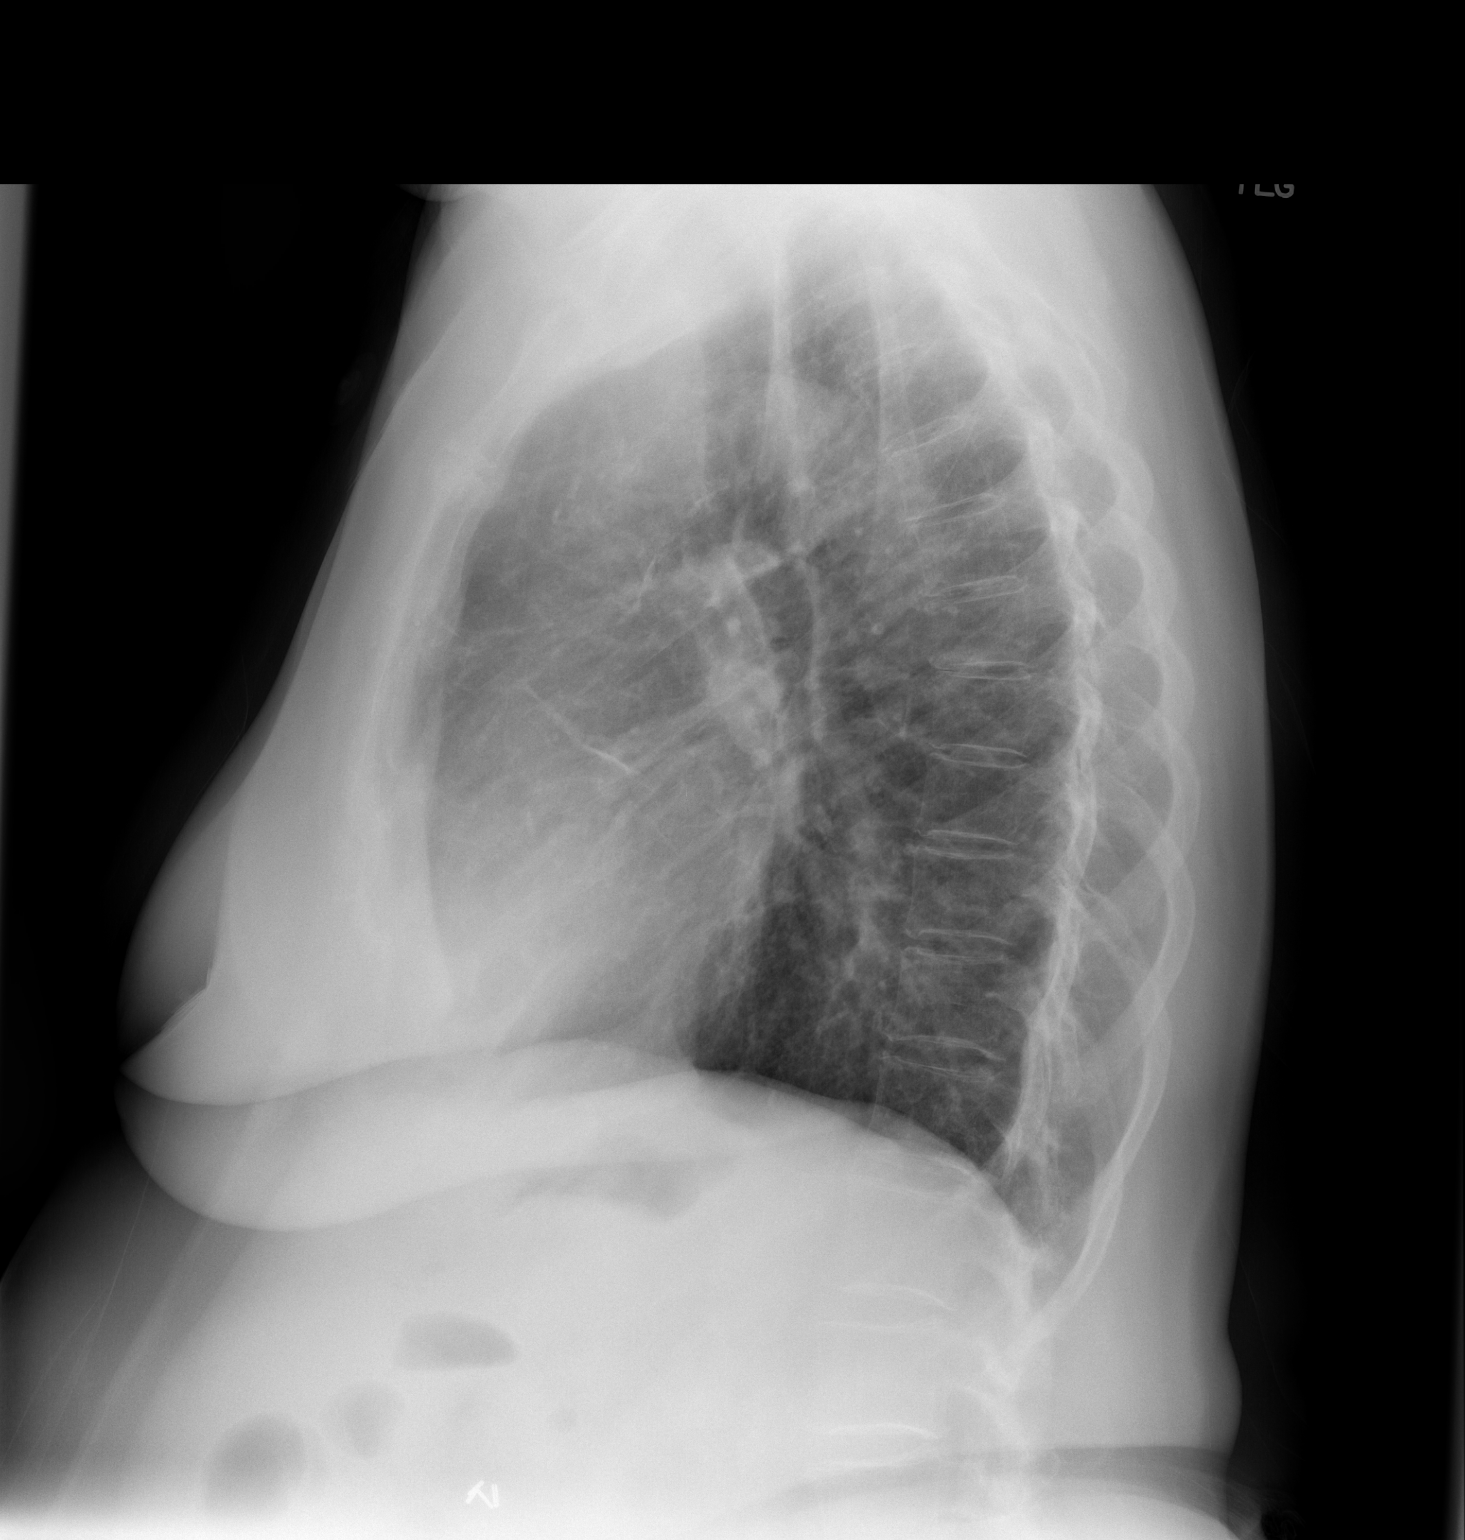

[2 of 2 positions shown; findings below may reference images not displayed]

FINDINGS: Normal heart size and mediastinal contours. Aortic atherosclerosis.
Pulmonary hyperinflation. No edema or consolidation. No effusion or
pneumothorax. No acute osseous findings.
IMPRESSION: No active cardiopulmonary disease.

## 2014-01-14 MED ORDER — IPRATROPIUM-ALBUTEROL 0.5-2.5 (3) MG/3ML IN SOLN
3.0000 mL | Freq: Once | RESPIRATORY_TRACT | Status: AC
  Administered 2014-01-14: 3 mL via RESPIRATORY_TRACT
  Filled 2014-01-14: qty 3

## 2014-01-14 NOTE — ED Notes (Addendum)
Presents with intermittent episodes of SOB began 2 days ago, resolves on own. HX of COPD. Breath sounds clear, SAts 100%.  Nothing makes SOB, nothing makes it worse. IT resolves on own. Denies SOB at this time. "while it is like that I feel ike I am going to lose it. As long as I keep a cold wash cloth to my nose I feel ike I can breath pretty good"speaking in full sentences, no use of accessory muscles. Denies pain. Denies dizziness, denies nausea

## 2014-01-15 ENCOUNTER — Emergency Department (HOSPITAL_COMMUNITY)
Admission: EM | Admit: 2014-01-15 | Discharge: 2014-01-15 | Disposition: A | Discharge status: Home / Self Care | Payer: Medicare HMO | Attending: Emergency Medicine | Admitting: Emergency Medicine

## 2014-01-15 DIAGNOSIS — R0981 Nasal congestion: Secondary | ICD-10-CM

## 2014-01-15 MED ORDER — SALINE SPRAY 0.65 % NA SOLN: 1.0000 | NASAL | Status: DC | PRN

## 2014-01-15 MED ORDER — FLUTICASONE PROPIONATE 50 MCG/ACT NA SUSP: 1.0000 | Freq: Every day | NASAL | Status: DC

## 2014-01-15 MED ORDER — FLUTICASONE PROPIONATE 50 MCG/ACT NA SUSP
1.0000 | Freq: Once | NASAL | Status: AC
  Administered 2014-01-15: 1 via NASAL
  Filled 2014-01-15 (×2): qty 16

## 2014-01-15 MED ORDER — SALINE SPRAY 0.65 % NA SOLN
1.0000 | Freq: Once | NASAL | Status: AC
  Administered 2014-01-15: 1 via NASAL
  Filled 2014-01-15: qty 44

## 2014-01-15 NOTE — ED Provider Notes (Signed)
CSN: [id number]     Arrival date & time 01/14/14  2143 History   First MD Initiated Contact with Patient 01/15/14 0037     Chief Complaint  Patient presents with  . Shortness of Breath     (Consider location/radiation/quality/duration/timing/severity/associated sxs/prior Treatment) HPI  This is a 80 year old female with history of COPD and coronary artery disease who presents with nasal congestion. Patient reports 3 days of worsening nasal congestion. She states "I can't breathe through my nose. She denies any shortness of breath or chest pain. She denies any sinus pressure, nasal drainage, sore throat. She denies any fevers or recent sick contacts. Patient states that warm steam helps some but she is "miserable." She's not taking anything else over-the-counter.  Past Medical History  Diagnosis Date  . COPD (chronic obstructive pulmonary disease)   . CAD (coronary artery disease)     Cardiac cath 1990 with Dr. Lia Foyer and [name] No cath since 1990  . Hyperlipidemia   . Hypertension   . Diabetes mellitus   . AV block, 1st degree    Past Surgical History  Procedure Laterality Date  . Cholecystectomy    . Cataract extraction  2010  . Ptca  1990   Family History  Problem Relation Age of Onset  . Heart attack Mother 60  . Cancer Father 30   History  Substance Use Topics  . Smoking status: Former Smoker -- 1.00 packs/day for 55 years    Types: Cigarettes    Quit date: 09/29/2006  . Smokeless tobacco: Not on file  . Alcohol Use: No   OB History   Grav Para Term Preterm Abortions TAB SAB Ect Mult Living                 Review of Systems  Constitutional: Negative for fever.  HENT: Positive for congestion. Negative for rhinorrhea, sinus pressure and sore throat.   Respiratory: Negative for cough, chest tightness and  shortness of breath.   Cardiovascular: Negative for chest pain.  Gastrointestinal: Negative for nausea, vomiting and abdominal pain.  Genitourinary: Negative for dysuria.  Neurological: Negative for headaches.  All other systems reviewed and are negative.     Allergies  Codeine sulfate and Morphine sulfate  Home Medications   Prior to Admission medications   Medication Sig Start Date End Date Taking? Authorizing Provider  albuterol (PROVENTIL) (2.5 MG/3ML) 0.083% nebulizer solution Take 2.5 mg by nebulization every 6 (six) hours as needed for wheezing or shortness of breath.   Yes Historical Provider, MD  amLODipine (NORVASC) 10 MG tablet Take 10 mg by mouth daily.   Yes Historical Provider, MD  aspirin 81 MG tablet Take 81 mg by mouth daily.     Yes Historical Provider, MD  enalapril (VASOTEC) 20 MG tablet Take 20 mg by mouth 2 (two) times daily.   Yes Historical Provider, MD  fenofibrate micronized (LOFIBRA) 134 MG capsule Take 134 mg by mouth daily before breakfast.   Yes Historical Provider, MD  fish oil-omega-3 fatty acids 1000 MG capsule Take 2 g by mouth daily.     Yes Historical Provider, MD  Fluticasone-Salmeterol (ADVAIR) 250-50 MCG/DOSE AEPB Inhale 1 puff into the lungs 2 (two) times daily.   Yes Historical Provider, MD  folic acid (FOLVITE) 419 MCG tablet Take 400 mcg by mouth daily.  Yes Historical Provider, MD  furosemide (LASIX) 20 MG tablet Take 40 mg by mouth daily.   Yes Historical Provider, MD  glimepiride (AMARYL) 2 MG tablet Take 2 mg by mouth 2 (two) times daily.   Yes Historical Provider, MD  Insulin Glargine (LANTUS SOLOSTAR) 100 UNIT/ML Solostar Pen Inject 15 Units into the skin daily at 10 pm.   Yes Historical Provider, MD  meclizine (ANTIVERT) 25 MG tablet Take 25 mg by mouth 2 (two) times daily as needed for dizziness.   Yes Historical Provider, MD  meloxicam (MOBIC) 7.5 MG tablet Take 15 mg by mouth daily.   Yes Historical Provider, MD  metFORMIN  (GLUCOPHAGE) 1000 MG tablet Take 1,000 mg by mouth 2 (two) times daily with a meal.   Yes Historical Provider, MD  metoprolol-hydrochlorothiazide (LOPRESSOR HCT) 100-25 MG per tablet Take 1 tablet by mouth daily.   Yes Historical Provider, MD  omeprazole (PRILOSEC) 20 MG capsule Take 20 mg by mouth daily.   Yes Historical Provider, MD  potassium chloride SA (K-DUR,KLOR-CON) 20 MEQ tablet Take 20 mEq by mouth daily.   Yes Historical Provider, MD  rosuvastatin (CRESTOR) 40 MG tablet Take 40 mg by mouth daily.   Yes Historical Provider, MD  tiotropium (SPIRIVA) 18 MCG inhalation capsule Place 18 mcg into inhaler and inhale daily.   Yes Historical Provider, MD  traMADol (ULTRAM) 50 MG tablet Take 50 mg by mouth every 8 (eight) hours as needed for moderate pain.   Yes Historical Provider, MD  vitamin E 400 UNIT capsule Take 400 Units by mouth daily.     Yes Historical Provider, MD  fluticasone (FLONASE) 50 MCG/ACT nasal spray Place 1 spray into both nostrils daily. 01/15/14   Merryl Hacker, MD  sodium chloride (OCEAN) 0.65 % SOLN nasal spray Place 1 spray into both nostrils as needed for congestion. 01/15/14   Merryl Hacker, MD   BP 141/68  Pulse 105  Temp(Src) 98.5 F (36.9 C) (Oral)  Resp 13  Ht 5\' 5"  (1.651 m)  Wt 180 lb (81.647 kg)  BMI 29.95 kg/m2  SpO2 100% Physical Exam  Nursing note and vitals reviewed. Constitutional: She is oriented to person, place, and time. No distress.  Elderly  HENT:  Head: Normocephalic and atraumatic.  Mouth/Throat: Oropharynx is clear and moist. No oropharyngeal exudate.  Nasal turbinates erythematous and swollen  Eyes: Pupils are equal, round, and reactive to light.  Neck: Neck supple.  Cardiovascular: Normal rate, regular rhythm and normal heart sounds.   No murmur heard. Pulmonary/Chest: Effort normal and breath sounds normal. No respiratory distress. She has no wheezes.  Abdominal: Soft. Bowel sounds are normal. There is no tenderness. There is  no rebound.  Musculoskeletal: She exhibits edema.  Lymphadenopathy:    She has no cervical adenopathy.  Neurological: She is alert and oriented to person, place, and time.  Skin: Skin is warm and dry.  Psychiatric: She has a normal mood and affect.    ED Course  Procedures (including critical care time) Labs Review Labs Reviewed  CBC - Abnormal; Notable for the following:    Hemoglobin 11.6 (*)    HCT 35.0 (*)    All other components within normal limits  BASIC METABOLIC PANEL - Abnormal; Notable for the following:    Potassium 3.1 (*)    Chloride 95 (*)    Glucose, Bld 119 (*)    BUN 30 (*)    Creatinine, Ser 1.33 (*)    GFR calc non  Af Amer 37 (*)    GFR calc Af Amer 43 (*)    All other components within normal limits  I-STAT TROPOININ, ED    Imaging Review Dg Chest 2 View  01/14/2014   CLINICAL DATA:  Shortness of breath  EXAM: CHEST  2 VIEW  COMPARISON:  02/02/2012  FINDINGS: Normal heart size and mediastinal contours. Aortic atherosclerosis. Pulmonary hyperinflation. No edema or consolidation. No effusion or pneumothorax. No acute osseous findings.  IMPRESSION: No active cardiopulmonary disease.   Electronically Signed   By: Jorje Guild M.D.   On: 01/14/2014 22:46     EKG Interpretation   Date/Time:  Tuesday January 14 2014 21:49:14 EDT Ventricular Rate:  96 PR Interval:  220 QRS Duration: 90 QT Interval:  362 QTC Calculation: 457 R Axis:   -18 Text Interpretation:  Sinus rhythm with 1st degree A-V block Possible  Anterior infarct , age undetermined Abnormal ECG Confirmed by HORTON  MD,  COURTNEY ([id number]) on 01/15/2014 12:37:02 AM      MDM   Final diagnoses:  Nasal congestion    Patient presents with nasal congestion.  Lab work and imaging reviewed from triage as patient described her symptoms more shortness of breath.  Lab work at patient's baseline. Chest x-ray within normal limits. Pulmonary exam without wheezing. Patient does have evidence of swollen  nasal turbinates and redness. Given age, would continue supportive care with nasal saline and adding Flonase.  Discussed with patient that would not recommend any additional over-the-counter medications at this time. Patient was encouraged to use a humidifier to keep her turbinates moist. Patient stated understanding.  After history, exam, and medical workup I feel the patient has been appropriately medically screened and is safe for discharge home. Pertinent diagnoses were discussed with the patient. Patient was given return precautions.     Merryl Hacker, MD 01/15/14 [phone]

## 2014-01-15 NOTE — Discharge Instructions (Signed)
Upper Respiratory Infection/Congestion, Adult An upper respiratory infection (URI) is also sometimes known as the common cold. The upper respiratory tract includes the nose, sinuses, throat, trachea, and bronchi. Bronchi are the airways leading to the lungs. Most people improve within 1 week, but symptoms can last up to 2 weeks. A residual cough may last even longer.  CAUSES Many different viruses can infect the tissues lining the upper respiratory tract. The tissues become irritated and inflamed and often become very moist. Mucus production is also common. A cold is contagious. You can easily spread the virus to others by oral contact. This includes kissing, sharing a glass, coughing, or sneezing. Touching your mouth or nose and then touching a surface, which is then touched by another person, can also spread the virus. SYMPTOMS  Symptoms typically develop 1 to 3 days after you come in contact with a cold virus. Symptoms vary from person to person. They may include:  Runny nose.  Sneezing.  Nasal congestion.  Sinus irritation.  Sore throat.  Loss of voice (laryngitis).  Cough.  Fatigue.  Muscle aches.  Loss of appetite.  Headache.  Low-grade fever. DIAGNOSIS  You might diagnose your own cold based on familiar symptoms, since most people get a cold 2 to 3 times a year. Your caregiver can confirm this based on your exam. Most importantly, your caregiver can check that your symptoms are not due to another disease such as strep throat, sinusitis, pneumonia, asthma, or epiglottitis. Blood tests, throat tests, and X-rays are not necessary to diagnose a common cold, but they may sometimes be helpful in excluding other more serious diseases. Your caregiver will decide if any further tests are required. RISKS AND COMPLICATIONS  You may be at risk for a more severe case of the common cold if you smoke cigarettes, have chronic heart disease (such as heart failure) or lung disease (such as  asthma), or if you have a weakened immune system. The very young and very old are also at risk for more serious infections. Bacterial sinusitis, middle ear infections, and bacterial pneumonia can complicate the common cold. The common cold can worsen asthma and chronic obstructive pulmonary disease (COPD). Sometimes, these complications can require emergency medical care and may be life-threatening. PREVENTION  The best way to protect against getting a cold is to practice good hygiene. Avoid oral or hand contact with people with cold symptoms. Wash your hands often if contact occurs. There is no clear evidence that vitamin C, vitamin E, echinacea, or exercise reduces the chance of developing a cold. However, it is always recommended to get plenty of rest and practice good nutrition. TREATMENT  Treatment is directed at relieving symptoms. There is no cure. Antibiotics are not effective, because the infection is caused by a virus, not by bacteria. Treatment may include:  Increased fluid intake. Sports drinks offer valuable electrolytes, sugars, and fluids.  Breathing heated mist or steam (vaporizer or shower).  Eating chicken soup or other clear broths, and maintaining good nutrition.  Getting plenty of rest.  Using gargles or lozenges for comfort.  Controlling fevers with ibuprofen or acetaminophen as directed by your caregiver.  Increasing usage of your inhaler if you have asthma. Zinc gel and zinc lozenges, taken in the first 24 hours of the common cold, can shorten the duration and lessen the severity of symptoms. Pain medicines may help with fever, muscle aches, and throat pain. A variety of non-prescription medicines are available to treat congestion and runny nose. Your caregiver  can make recommendations and may suggest nasal or lung inhalers for other symptoms.  HOME CARE INSTRUCTIONS   Only take over-the-counter or prescription medicines for pain, discomfort, or fever as directed by your  caregiver.  Use a warm mist humidifier or inhale steam from a shower to increase air moisture. This may keep secretions moist and make it easier to breathe.  Drink enough water and fluids to keep your urine clear or pale yellow.  Rest as needed.  Return to work when your temperature has returned to normal or as your caregiver advises. You may need to stay home longer to avoid infecting others. You can also use a face mask and careful hand washing to prevent spread of the virus. SEEK MEDICAL CARE IF:   After the first few days, you feel you are getting worse rather than better.  You need your caregiver's advice about medicines to control symptoms.  You develop chills, worsening shortness of breath, or brown or red sputum. These may be signs of pneumonia.  You develop yellow or brown nasal discharge or pain in the face, especially when you bend forward. These may be signs of sinusitis.  You develop a fever, swollen neck glands, pain with swallowing, or white areas in the back of your throat. These may be signs of strep throat. SEEK IMMEDIATE MEDICAL CARE IF:   You have a fever.  You develop severe or persistent headache, ear pain, sinus pain, or chest pain.  You develop wheezing, a prolonged cough, cough up blood, or have a change in your usual mucus (if you have chronic lung disease).  You develop sore muscles or a stiff neck. Document Released: 12/28/2000 Document Revised: 09/26/2011 Document Reviewed: 11/05/2010 [facility] Patient Information 2015 [employer], [place]. This information is not intended to replace advice given to you by your health care provider. Make sure you discuss any questions you have with your health care provider.

## 2014-01-22 ENCOUNTER — Other Ambulatory Visit: Payer: Self-pay | Admitting: Internal Medicine

## 2014-01-23 ENCOUNTER — Encounter: Payer: Self-pay | Admitting: Internal Medicine

## 2014-02-02 ENCOUNTER — Other Ambulatory Visit: Payer: Self-pay | Admitting: Internal Medicine

## 2014-02-08 ENCOUNTER — Other Ambulatory Visit: Payer: Self-pay | Admitting: Internal Medicine

## 2014-02-09 ENCOUNTER — Emergency Department (HOSPITAL_COMMUNITY): Payer: Medicare HMO

## 2014-02-09 ENCOUNTER — Emergency Department (HOSPITAL_COMMUNITY)
Admission: EM | Admit: 2014-02-09 | Discharge: 2014-02-09 | Disposition: A | Discharge status: Home / Self Care | Payer: Medicare HMO | Attending: Emergency Medicine | Admitting: Emergency Medicine

## 2014-02-09 ENCOUNTER — Encounter (HOSPITAL_COMMUNITY): Payer: Self-pay | Admitting: Emergency Medicine

## 2014-02-09 DIAGNOSIS — Z87891 Personal history of nicotine dependence: Secondary | ICD-10-CM | POA: Diagnosis not present

## 2014-02-09 DIAGNOSIS — Z79899 Other long term (current) drug therapy: Secondary | ICD-10-CM | POA: Insufficient documentation

## 2014-02-09 DIAGNOSIS — E119 Type 2 diabetes mellitus without complications: Secondary | ICD-10-CM | POA: Insufficient documentation

## 2014-02-09 DIAGNOSIS — R0602 Shortness of breath: Secondary | ICD-10-CM | POA: Insufficient documentation

## 2014-02-09 DIAGNOSIS — J440 Chronic obstructive pulmonary disease with acute lower respiratory infection: Secondary | ICD-10-CM | POA: Diagnosis not present

## 2014-02-09 DIAGNOSIS — Z794 Long term (current) use of insulin: Secondary | ICD-10-CM | POA: Diagnosis not present

## 2014-02-09 DIAGNOSIS — E785 Hyperlipidemia, unspecified: Secondary | ICD-10-CM | POA: Diagnosis not present

## 2014-02-09 DIAGNOSIS — Z9861 Coronary angioplasty status: Secondary | ICD-10-CM | POA: Diagnosis not present

## 2014-02-09 DIAGNOSIS — Z7982 Long term (current) use of aspirin: Secondary | ICD-10-CM | POA: Diagnosis not present

## 2014-02-09 DIAGNOSIS — I10 Essential (primary) hypertension: Secondary | ICD-10-CM | POA: Insufficient documentation

## 2014-02-09 DIAGNOSIS — R0600 Dyspnea, unspecified: Secondary | ICD-10-CM

## 2014-02-09 DIAGNOSIS — I2510 Atherosclerotic heart disease of native coronary artery without angina pectoris: Secondary | ICD-10-CM | POA: Diagnosis not present

## 2014-02-09 DIAGNOSIS — J209 Acute bronchitis, unspecified: Secondary | ICD-10-CM | POA: Insufficient documentation

## 2014-02-09 LAB — CBC WITH DIFFERENTIAL/PLATELET
BASOS ABS: 0 10*3/uL (ref 0.0–0.1)
Basophils Relative: 0 % (ref 0–1)
EOS ABS: 0.1 10*3/uL (ref 0.0–0.7)
EOS PCT: 2 % (ref 0–5)
HCT: 38.7 % (ref 36.0–46.0)
Hemoglobin: 12.6 g/dL (ref 12.0–15.0)
Lymphocytes Relative: 32 % (ref 12–46)
Lymphs Abs: 2.4 10*3/uL (ref 0.7–4.0)
MCH: 26.3 pg (ref 26.0–34.0)
MCHC: 32.6 g/dL (ref 30.0–36.0)
MCV: 80.8 fL (ref 78.0–100.0)
Monocytes Absolute: 0.6 10*3/uL (ref 0.1–1.0)
Monocytes Relative: 8 % (ref 3–12)
Neutro Abs: 4.3 10*3/uL (ref 1.7–7.7)
Neutrophils Relative %: 58 % (ref 43–77)
PLATELETS: 256 10*3/uL (ref 150–400)
RBC: 4.79 MIL/uL (ref 3.87–5.11)
RDW: 14.6 % (ref 11.5–15.5)
WBC: 7.5 10*3/uL (ref 4.0–10.5)

## 2014-02-09 LAB — I-STAT TROPONIN, ED: Troponin i, poc: 0 ng/mL (ref 0.00–0.08)

## 2014-02-09 LAB — BASIC METABOLIC PANEL
Anion gap: 20 — ABNORMAL HIGH (ref 5–15)
BUN: 26 mg/dL — AB (ref 6–23)
CALCIUM: 10.4 mg/dL (ref 8.4–10.5)
CO2: 22 meq/L (ref 19–32)
CREATININE: 0.93 mg/dL (ref 0.50–1.10)
Chloride: 97 mEq/L (ref 96–112)
GFR calc Af Amer: 66 mL/min — ABNORMAL LOW (ref >90)
GFR, EST NON AFRICAN AMERICAN: 57 mL/min — AB (ref >90)
Glucose, Bld: 95 mg/dL (ref 70–99)
Potassium: 3.7 mEq/L (ref 3.7–5.3)
SODIUM: 139 meq/L (ref 137–147)

## 2014-02-09 IMAGING — CR DG CHEST 2V
2 series · 2 of 2 positions shown · non-contrast
Comparison: [DATE]

CLINICAL DATA: Shortness of breath, chest pain

EXAM:
CHEST  2 VIEW

[w chest pa]
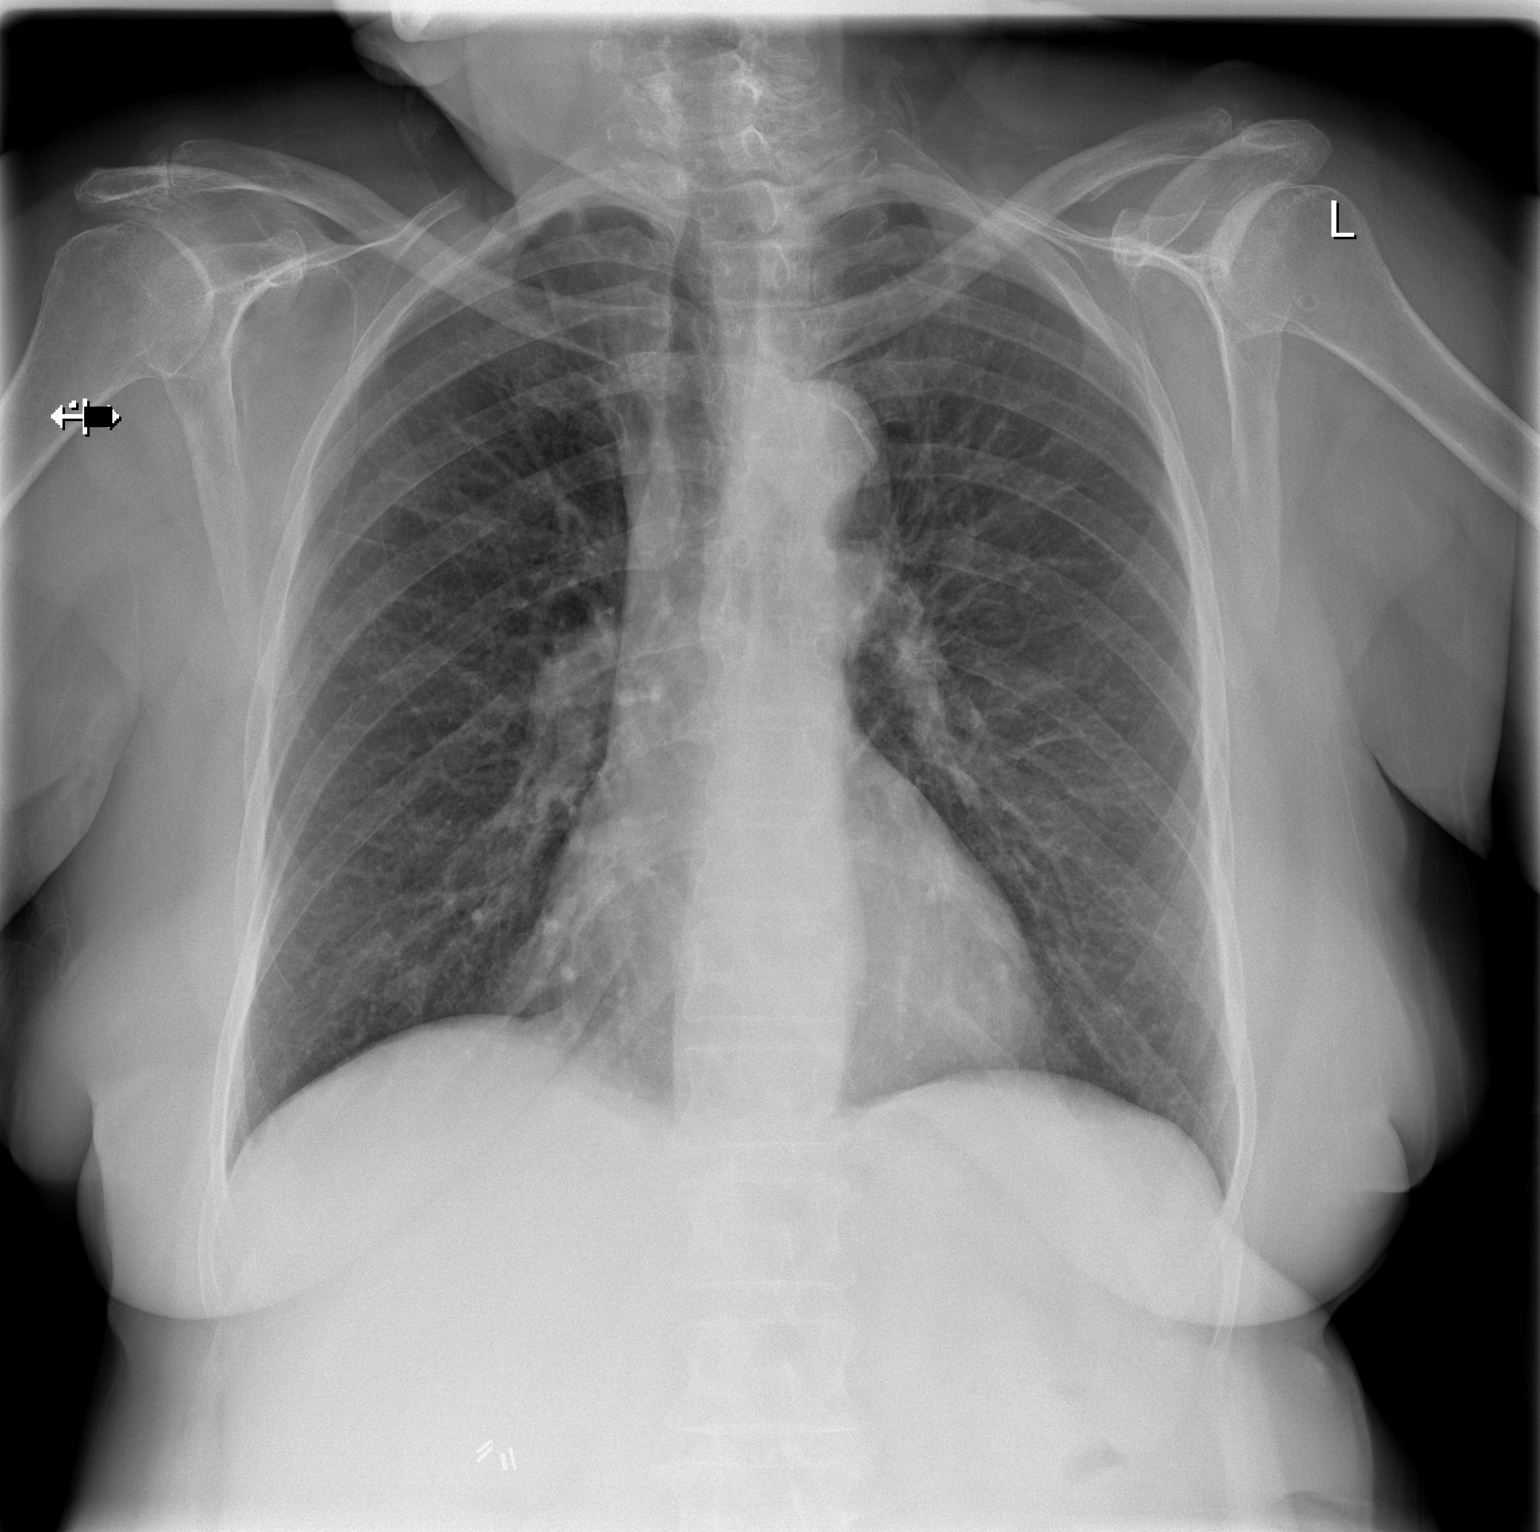

[w chest lat]
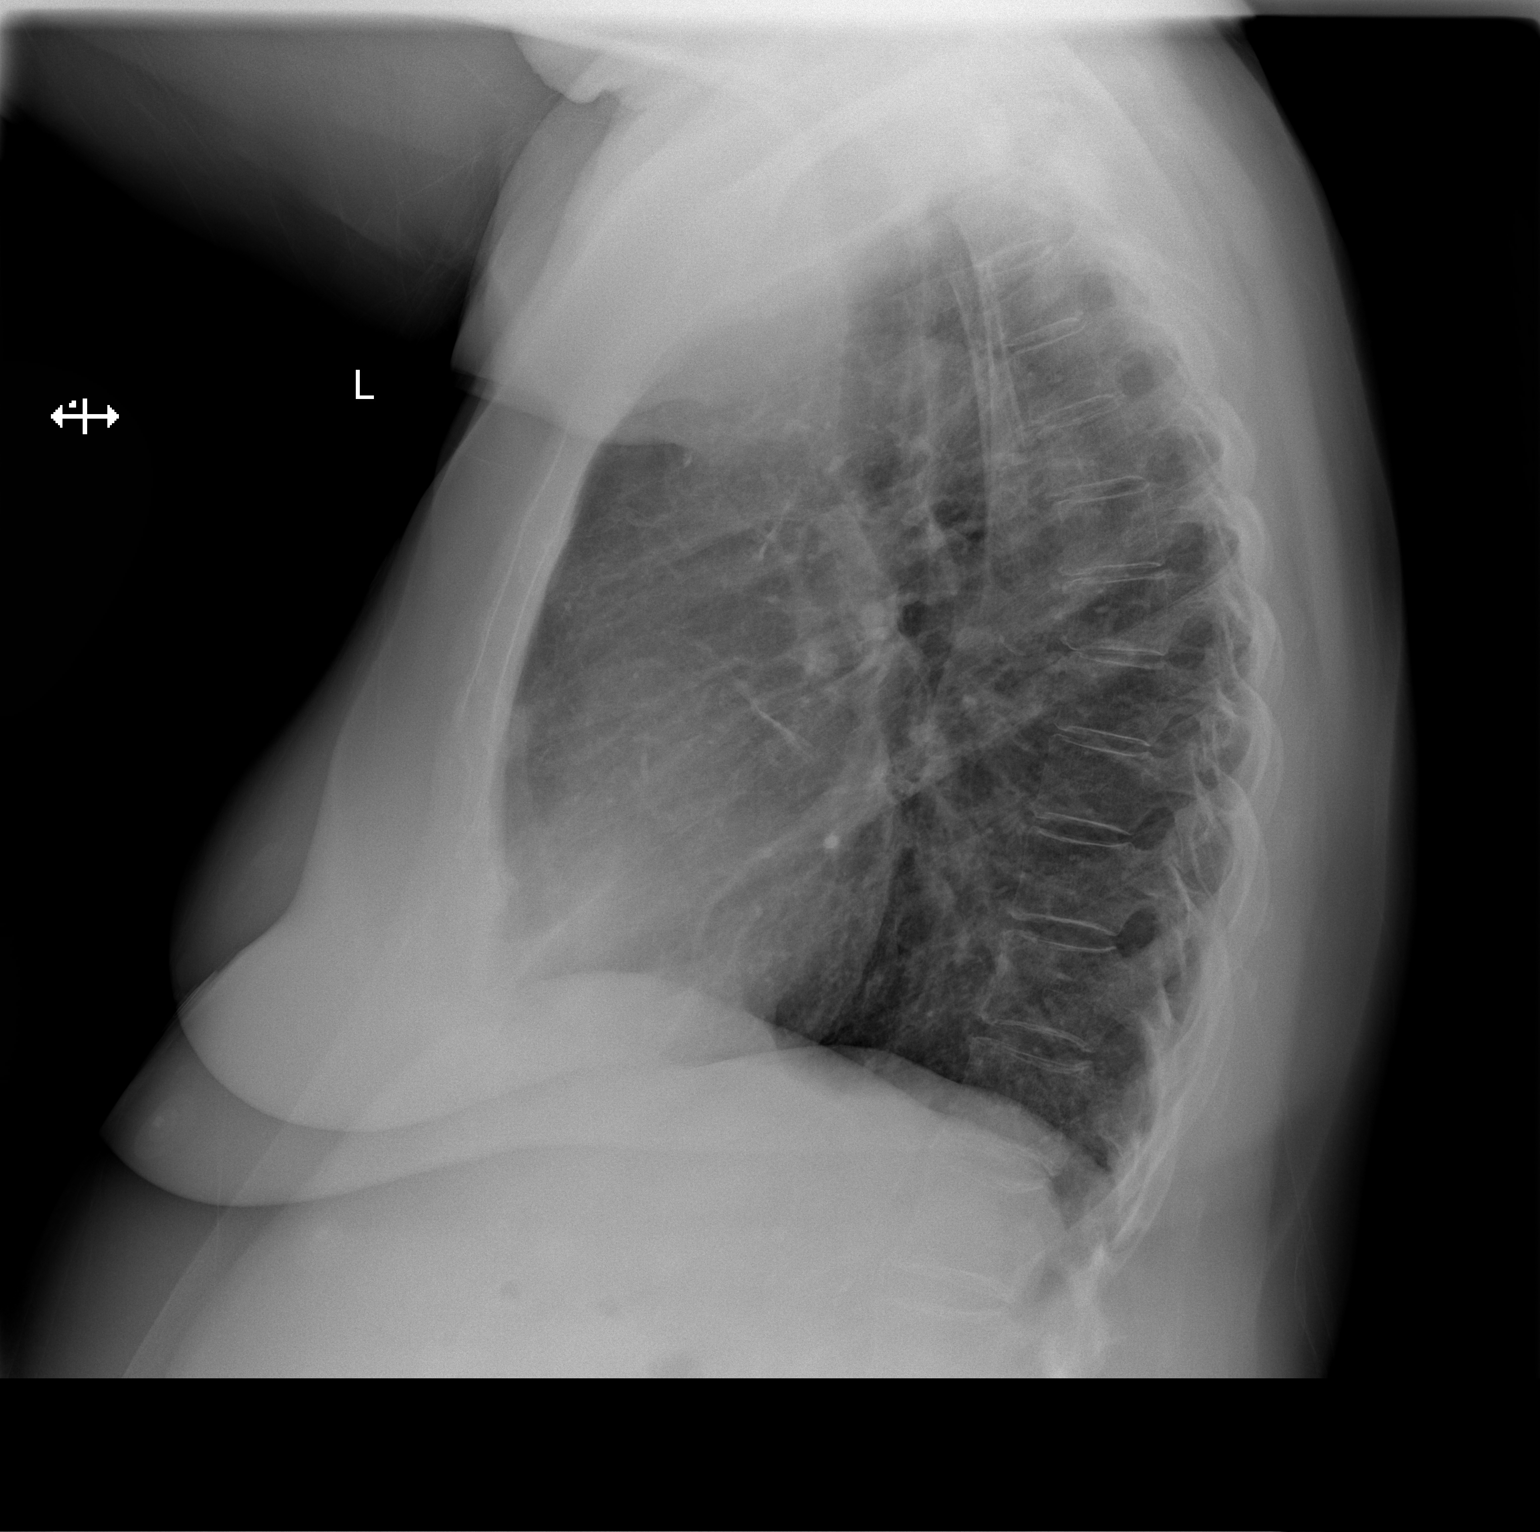

[2 of 2 positions shown; findings below may reference images not displayed]

FINDINGS: Normal heart size. There is calcification of the aorta. Vascular
pattern is normal and lungs are clear. No pleural effusions there
IMPRESSION: No active cardiopulmonary disease.

## 2014-02-09 MED ORDER — IPRATROPIUM BROMIDE 0.02 % IN SOLN
0.5000 mg | Freq: Once | RESPIRATORY_TRACT | Status: AC
  Administered 2014-02-09: 0.5 mg via RESPIRATORY_TRACT
  Filled 2014-02-09: qty 2.5

## 2014-02-09 MED ORDER — METHYLPREDNISOLONE SODIUM SUCC 125 MG IJ SOLR
125.0000 mg | Freq: Once | INTRAMUSCULAR | Status: AC
  Administered 2014-02-09: 125 mg via INTRAVENOUS
  Filled 2014-02-09: qty 2

## 2014-02-09 MED ORDER — ALBUTEROL SULFATE (2.5 MG/3ML) 0.083% IN NEBU
5.0000 mg | INHALATION_SOLUTION | Freq: Once | RESPIRATORY_TRACT | Status: AC
  Administered 2014-02-09: 5 mg via RESPIRATORY_TRACT
  Filled 2014-02-09: qty 6

## 2014-02-09 MED ORDER — AZITHROMYCIN 250 MG PO TABS: ORAL_TABLET | ORAL | Status: DC

## 2014-02-09 MED ORDER — PREDNISONE 10 MG PO TABS: 20.0000 mg | ORAL_TABLET | Freq: Two times a day (BID) | ORAL | Status: DC

## 2014-02-09 NOTE — ED Notes (Addendum)
C/o sob on and off for 3 weeks, was seen here 3 weeks ago for the same, relates sx to COPD. (denies pain, fever, nvd or dizziness). Alertt, NAD, calm, interactive, some increased wob and sighing, able to speak in sentences. Skin W&D. Is not on O2 at home. No PCP or pulm MD. Has been using nebs.

## 2014-02-09 NOTE — ED Provider Notes (Signed)
CSN: [id number]     Arrival date & time 02/09/14  2006 History   First MD Initiated Contact with Patient 02/09/14 2109     Chief Complaint  Patient presents with  . Shortness of Breath     (Consider location/radiation/quality/duration/timing/severity/associated sxs/prior Treatment) HPI Comments: Patient is a 80 year old female with history of diabetes and COPD. She presents today with complaints of difficulty breathing. She states she feels like she can't oxygen through her nose. She was seen here 3 weeks ago for similar complaints and was given Flonase and saline nasal spray. This has not helped her. She presents today with complaints of persistent difficulty breathing.  Patient is a 80 y.o. female presenting with shortness of breath. The history is provided by the patient.  Shortness of Breath Severity:  Moderate Onset quality:  Gradual Duration:  3 weeks Timing:  Constant Progression:  Worsening Chronicity:  New Relieved by:  Nothing Worsened by:  Nothing tried   Past Medical History  Diagnosis Date  . COPD (chronic obstructive pulmonary disease)   . CAD (coronary artery disease)     Cardiac cath 1990 with Dr. Lia Foyer and [name] No cath since 1990  . Hyperlipidemia   . Hypertension   . Diabetes mellitus   . AV block, 1st degree    Past Surgical History  Procedure Laterality Date  . Cholecystectomy    . Cataract extraction  2010  . Ptca  1990   Family History  Problem Relation Age of Onset  . Heart attack Mother 58  . Cancer Father 53   History  Substance Use Topics  . Smoking status: Former Smoker -- 1.00 packs/day for 55 years    Types: Cigarettes    Quit date: 09/29/2006  . Smokeless tobacco: Not on file  . Alcohol Use: No   OB History   Grav Para Term Preterm Abortions TAB SAB Ect Mult Living       Review of Systems  Respiratory: Positive for shortness of breath.   All other systems reviewed and are negative.     Allergies  Codeine sulfate and Morphine sulfate  Home Medications   Prior to Admission medications   Medication Sig Start Date End Date Taking? Authorizing Provider  albuterol (PROVENTIL) (2.5 MG/3ML) 0.083% nebulizer solution Take 2.5 mg by nebulization every 6 (six) hours as needed for wheezing or shortness of breath.    Historical Provider, MD  amLODipine (NORVASC) 10 MG tablet TAKE 1 TABLET (10 MG TOTAL) BY MOUTH DAILY.    Lisabeth Pick, MD  aspirin 81 MG tablet Take 81 mg by mouth daily.      Historical Provider, MD  CRESTOR 40 MG tablet TAKE 1 TABLET BY MOUTH DAILY    Darrick Penna Swords, MD  enalapril (VASOTEC) 20 MG tablet Take 20 mg by mouth 2 (two) times daily.    Historical Provider, MD  fenofibrate micronized (LOFIBRA) 134 MG capsule TAKE ONE CAPSULE BY MOUTH DAILY    Lisabeth Pick, MD  fish oil-omega-3 fatty acids 1000 MG capsule Take 2 g by mouth daily.      Historical Provider, MD  fluticasone (FLONASE) 50 MCG/ACT nasal spray Place 1 spray into both nostrils daily. 01/15/14   Merryl Hacker, MD  Fluticasone-Salmeterol (ADVAIR) 250-50 MCG/DOSE AEPB Inhale 1 puff into the lungs 2 (two) times daily.  Historical Provider, MD  folic acid (FOLVITE) 295 MCG tablet Take 400 mcg by mouth daily.      Historical Provider, MD  furosemide (LASIX) 20 MG tablet Take 40 mg by mouth daily.    Historical Provider, MD  glimepiride (AMARYL) 2 MG tablet TAKE 1 TABLET (2 MG TOTAL) BY MOUTH 2 (TWO) TIMES DAILY BEFORE A MEAL.    Lisabeth Pick, MD  Insulin Glargine (LANTUS SOLOSTAR) 100 UNIT/ML Solostar Pen Inject 15 Units into the skin daily at 10 pm.    Historical Provider, MD  meclizine (ANTIVERT) 25 MG tablet Take 25 mg by mouth 2 (two) times daily as needed for dizziness.    Historical Provider, MD  meloxicam (MOBIC) 7.5 MG tablet Take 15 mg by mouth daily.    Historical  Provider, MD  metFORMIN (GLUCOPHAGE) 1000 MG tablet Take 1,000 mg by mouth 2 (two) times daily with a meal.    Historical Provider, MD  metoprolol-hydrochlorothiazide (LOPRESSOR HCT) 100-25 MG per tablet TAKE 1 TABLET BY MOUTH DAILY.    Lisabeth Pick, MD  omeprazole (PRILOSEC) 20 MG capsule TAKE ONE CAPSULE BY MOUTH DAILY    Darrick Penna Swords, MD  potassium chloride SA (K-DUR,KLOR-CON) 20 MEQ tablet Take 20 mEq by mouth daily.    Historical Provider, MD  sodium chloride (OCEAN) 0.65 % SOLN nasal spray Place 1 spray into both nostrils as needed for congestion. 01/15/14   Merryl Hacker, MD  tiotropium (SPIRIVA) 18 MCG inhalation capsule Place 18 mcg into inhaler and inhale daily.    Historical Provider, MD  traMADol (ULTRAM) 50 MG tablet TAKE 1 TABLET BY MOUTH EVERY 8 HOURS AS NEEDED    Lisabeth Pick, MD  vitamin E 400 UNIT capsule Take 400 Units by mouth daily.      Historical Provider, MD   BP 137/70  Pulse 90  Temp(Src) 98.1 F (36.7 C) (Oral)  Resp 16  Ht 5\' 5"  (1.651 m)  Wt 175 lb (79.379 kg)  BMI 29.12 kg/m2  SpO2 98% Physical Exam  Nursing note and vitals reviewed. Constitutional: She is oriented to person, place, and time. She appears well-developed and well-nourished. No distress.  HENT:  Head: Normocephalic and atraumatic.  Neck: Normal range of motion. Neck supple.  Cardiovascular: Normal rate and regular rhythm.  Exam reveals no gallop and no friction rub.   No murmur heard. Pulmonary/Chest: Effort normal and breath sounds normal. No respiratory distress. She has no wheezes. She has no rales. She exhibits no tenderness.  Abdominal: Soft. Bowel sounds are normal. She exhibits no distension. There is no tenderness.  Musculoskeletal: Normal range of motion.  Neurological: She is alert and oriented to person, place, and time.  Skin: Skin is warm and dry. She is not diaphoretic.    ED Course  Procedures (including critical care time) Labs Review Labs Reviewed  CBC WITH  DIFFERENTIAL  BASIC METABOLIC PANEL  PRO B NATRIURETIC PEPTIDE  I-STAT [place], ED    Imaging Review No results found.   EKG Interpretation   Date/Time:  Sunday February 09 2014 20:14:52 EDT Ventricular Rate:  93 PR Interval:  212 QRS Duration: 90 QT Interval:  368 QTC Calculation: 457 R Axis:   -18 Text Interpretation:  Sinus rhythm with 1st degree A-V block Low voltage  QRS Cannot rule out Anterior infarct , age undetermined Abnormal ECG  Confirmed by Beau Fanny  MD, Ramadan Couey ([id number]) on 02/09/2014 11:34:23 PM      MDM   Final diagnoses:  None    Patient presents here with difficulty breathing. She was seen for similar complaints 2 weeks ago and was told she likely had sinus congestion. She was discharged with Flonase and saline nasal spray. She states that her symptoms have worsened and are now moving into her chest. She is feeling like she is having a difficult time breathing. Workup reveals no evidence for a cardiac etiology and chest x-ray reveals no acute pulmonary disease. Due to the prolonged length of her illness, I will prescribe antibiotics and steroids and she is to followup as needed if she's not improving.    Veryl Speak, MD 02/09/14 2337

## 2014-02-09 NOTE — Discharge Instructions (Signed)
Zithromax as prescribed. Prednisone as prescribed.  Return to the emergency department if you develop worsening breathing, chest pain, or other new and concerning symptoms.   Acute Bronchitis Bronchitis is inflammation of the airways that extend from the windpipe into the lungs (bronchi). The inflammation often causes mucus to develop. This leads to a cough, which is the most common symptom of bronchitis.  In acute bronchitis, the condition usually develops suddenly and goes away over time, usually in a couple weeks. Smoking, allergies, and asthma can make bronchitis worse. Repeated episodes of bronchitis may cause further lung problems.  CAUSES Acute bronchitis is most often caused by the same virus that causes a cold. The virus can spread from person to person (contagious) through coughing, sneezing, and touching contaminated objects. SIGNS AND SYMPTOMS   Cough.   Fever.   Coughing up mucus.   Body aches.   Chest congestion.   Chills.   Shortness of breath.   Sore throat.  DIAGNOSIS  Acute bronchitis is usually diagnosed through a physical exam. Your health care provider will also ask you questions about your medical history. Tests, such as chest X-rays, are sometimes done to rule out other conditions.  TREATMENT  Acute bronchitis usually goes away in a couple weeks. Oftentimes, no medical treatment is necessary. Medicines are sometimes given for relief of fever or cough. Antibiotic medicines are usually not needed but may be prescribed in certain situations. In some cases, an inhaler may be recommended to help reduce shortness of breath and control the cough. A cool mist vaporizer may also be used to help thin bronchial secretions and make it easier to clear the chest.  HOME CARE INSTRUCTIONS  Get plenty of rest.   Drink enough fluids to keep your urine clear or pale yellow (unless you have a medical condition that requires fluid restriction). Increasing fluids may help  thin your respiratory secretions (sputum) and reduce chest congestion, and it will prevent dehydration.   Take medicines only as directed by your health care provider.  If you were prescribed an antibiotic medicine, finish it all even if you start to feel better.  Avoid smoking and secondhand smoke. Exposure to cigarette smoke or irritating chemicals will make bronchitis worse. If you are a smoker, consider using nicotine gum or skin patches to help control withdrawal symptoms. Quitting smoking will help your lungs heal faster.   Reduce the chances of another bout of acute bronchitis by washing your hands frequently, avoiding people with cold symptoms, and trying not to touch your hands to your mouth, nose, or eyes.   Keep all follow-up visits as directed by your health care provider.  SEEK MEDICAL CARE IF: Your symptoms do not improve after 1 week of treatment.  SEEK IMMEDIATE MEDICAL CARE IF:  You develop an increased fever or chills.   You have chest pain.   You have severe shortness of breath.  You have bloody sputum.   You develop dehydration.  You faint or repeatedly feel like you are going to pass out.  You develop repeated vomiting.  You develop a severe headache. MAKE SURE YOU:   Understand these instructions.  Will watch your condition.  Will get help right away if you are not doing well or get worse. Document Released: 08/11/2004 Document Revised: 11/18/2013 Document Reviewed: 12/25/2012 [facility] Patient Information 2015 [place], [place]. This information is not intended to replace advice given to you by your health care provider. Make sure you discuss any questions you have with your  health care provider. ° °

## 2014-02-09 NOTE — ED Notes (Signed)
Pt to go to B17 from xray. Xray notified. Report given to [facility], Therapist, sports.

## 2014-02-09 NOTE — ED Notes (Signed)
Neb complete, pt to xray via w/c, "feels a little better".

## 2014-02-18 ENCOUNTER — Encounter (HOSPITAL_COMMUNITY)
Admission: EM | Disposition: A | Discharge status: Home Health | Payer: Self-pay | Source: Home / Self Care | Attending: Cardiology

## 2014-02-18 ENCOUNTER — Inpatient Hospital Stay (HOSPITAL_COMMUNITY)
Admission: EM | Admit: 2014-02-18 | Discharge: 2014-02-21 | DRG: 247 | Disposition: A | Discharge status: Home Health | Payer: Medicare HMO | Attending: Cardiology | Admitting: Cardiology

## 2014-02-18 ENCOUNTER — Encounter (HOSPITAL_COMMUNITY): Payer: Self-pay | Admitting: Emergency Medicine

## 2014-02-18 ENCOUNTER — Emergency Department (HOSPITAL_COMMUNITY): Payer: Medicare HMO

## 2014-02-18 DIAGNOSIS — I2541 Coronary artery aneurysm: Secondary | ICD-10-CM | POA: Diagnosis present

## 2014-02-18 DIAGNOSIS — Z87891 Personal history of nicotine dependence: Secondary | ICD-10-CM

## 2014-02-18 DIAGNOSIS — R5381 Other malaise: Secondary | ICD-10-CM | POA: Diagnosis not present

## 2014-02-18 DIAGNOSIS — E1159 Type 2 diabetes mellitus with other circulatory complications: Secondary | ICD-10-CM | POA: Diagnosis present

## 2014-02-18 DIAGNOSIS — Z8249 Family history of ischemic heart disease and other diseases of the circulatory system: Secondary | ICD-10-CM | POA: Diagnosis not present

## 2014-02-18 DIAGNOSIS — I2510 Atherosclerotic heart disease of native coronary artery without angina pectoris: Secondary | ICD-10-CM | POA: Diagnosis present

## 2014-02-18 DIAGNOSIS — Z7902 Long term (current) use of antithrombotics/antiplatelets: Secondary | ICD-10-CM

## 2014-02-18 DIAGNOSIS — E785 Hyperlipidemia, unspecified: Secondary | ICD-10-CM | POA: Diagnosis present

## 2014-02-18 DIAGNOSIS — Z9861 Coronary angioplasty status: Secondary | ICD-10-CM | POA: Diagnosis not present

## 2014-02-18 DIAGNOSIS — Z7982 Long term (current) use of aspirin: Secondary | ICD-10-CM

## 2014-02-18 DIAGNOSIS — J449 Chronic obstructive pulmonary disease, unspecified: Secondary | ICD-10-CM | POA: Diagnosis present

## 2014-02-18 DIAGNOSIS — Z9849 Cataract extraction status, unspecified eye: Secondary | ICD-10-CM

## 2014-02-18 DIAGNOSIS — IMO0002 Reserved for concepts with insufficient information to code with codable children: Secondary | ICD-10-CM

## 2014-02-18 DIAGNOSIS — I9589 Other hypotension: Secondary | ICD-10-CM | POA: Diagnosis not present

## 2014-02-18 DIAGNOSIS — E119 Type 2 diabetes mellitus without complications: Secondary | ICD-10-CM

## 2014-02-18 DIAGNOSIS — Y831 Surgical operation with implant of artificial internal device as the cause of abnormal reaction of the patient, or of later complication, without mention of misadventure at the time of the procedure: Secondary | ICD-10-CM | POA: Diagnosis not present

## 2014-02-18 DIAGNOSIS — I213 ST elevation (STEMI) myocardial infarction of unspecified site: Secondary | ICD-10-CM | POA: Diagnosis present

## 2014-02-18 DIAGNOSIS — Z9089 Acquired absence of other organs: Secondary | ICD-10-CM | POA: Diagnosis not present

## 2014-02-18 DIAGNOSIS — I249 Acute ischemic heart disease, unspecified: Secondary | ICD-10-CM | POA: Diagnosis present

## 2014-02-18 DIAGNOSIS — Z79899 Other long term (current) drug therapy: Secondary | ICD-10-CM

## 2014-02-18 DIAGNOSIS — J4489 Other specified chronic obstructive pulmonary disease: Secondary | ICD-10-CM | POA: Diagnosis present

## 2014-02-18 DIAGNOSIS — Z6829 Body mass index (BMI) 29.0-29.9, adult: Secondary | ICD-10-CM

## 2014-02-18 DIAGNOSIS — M545 Low back pain, unspecified: Secondary | ICD-10-CM

## 2014-02-18 DIAGNOSIS — I2111 ST elevation (STEMI) myocardial infarction involving right coronary artery: Secondary | ICD-10-CM

## 2014-02-18 DIAGNOSIS — Z885 Allergy status to narcotic agent status: Secondary | ICD-10-CM | POA: Diagnosis not present

## 2014-02-18 DIAGNOSIS — I10 Essential (primary) hypertension: Secondary | ICD-10-CM | POA: Diagnosis present

## 2014-02-18 DIAGNOSIS — I25110 Atherosclerotic heart disease of native coronary artery with unstable angina pectoris: Secondary | ICD-10-CM

## 2014-02-18 DIAGNOSIS — E1169 Type 2 diabetes mellitus with other specified complication: Secondary | ICD-10-CM | POA: Diagnosis present

## 2014-02-18 DIAGNOSIS — I319 Disease of pericardium, unspecified: Secondary | ICD-10-CM

## 2014-02-18 DIAGNOSIS — I2119 ST elevation (STEMI) myocardial infarction involving other coronary artery of inferior wall: Principal | ICD-10-CM | POA: Diagnosis present

## 2014-02-18 DIAGNOSIS — I200 Unstable angina: Secondary | ICD-10-CM | POA: Diagnosis present

## 2014-02-18 DIAGNOSIS — Z955 Presence of coronary angioplasty implant and graft: Secondary | ICD-10-CM

## 2014-02-18 DIAGNOSIS — I440 Atrioventricular block, first degree: Secondary | ICD-10-CM | POA: Diagnosis present

## 2014-02-18 DIAGNOSIS — Z794 Long term (current) use of insulin: Secondary | ICD-10-CM | POA: Diagnosis not present

## 2014-02-18 DIAGNOSIS — R079 Chest pain, unspecified: Secondary | ICD-10-CM | POA: Diagnosis present

## 2014-02-18 DIAGNOSIS — T463X5A Adverse effect of coronary vasodilators, initial encounter: Secondary | ICD-10-CM | POA: Diagnosis not present

## 2014-02-18 DIAGNOSIS — E669 Obesity, unspecified: Secondary | ICD-10-CM | POA: Diagnosis present

## 2014-02-18 DIAGNOSIS — Y9210 Unspecified residential institution as the place of occurrence of the external cause: Secondary | ICD-10-CM | POA: Diagnosis not present

## 2014-02-18 DIAGNOSIS — T148XXA Other injury of unspecified body region, initial encounter: Secondary | ICD-10-CM

## 2014-02-18 HISTORY — DX: Type 2 diabetes mellitus without complications: Z79.4

## 2014-02-18 HISTORY — DX: Presence of coronary angioplasty implant and graft: Z95.5

## 2014-02-18 HISTORY — PX: LEFT HEART CATHETERIZATION WITH CORONARY ANGIOGRAM: SHX5451

## 2014-02-18 HISTORY — DX: Type 2 diabetes mellitus without complications: E11.9

## 2014-02-18 HISTORY — DX: Hyperlipidemia, unspecified: E78.5

## 2014-02-18 HISTORY — PX: CORONARY ANGIOPLASTY WITH STENT PLACEMENT: SHX49

## 2014-02-18 HISTORY — DX: Essential (primary) hypertension: I10

## 2014-02-18 LAB — BASIC METABOLIC PANEL
Anion gap: 14 (ref 5–15)
BUN: 40 mg/dL — AB (ref 6–23)
CHLORIDE: 101 meq/L (ref 96–112)
CO2: 23 meq/L (ref 19–32)
Calcium: 9.1 mg/dL (ref 8.4–10.5)
Creatinine, Ser: 1.25 mg/dL — ABNORMAL HIGH (ref 0.50–1.10)
GFR calc Af Amer: 46 mL/min — ABNORMAL LOW (ref >90)
GFR calc non Af Amer: 40 mL/min — ABNORMAL LOW (ref >90)
GLUCOSE: 95 mg/dL (ref 70–99)
POTASSIUM: 3.5 meq/L — AB (ref 3.7–5.3)
Sodium: 138 mEq/L (ref 137–147)

## 2014-02-18 LAB — POCT ACTIVATED CLOTTING TIME
Activated Clotting Time: 163 seconds
Activated Clotting Time: 225 seconds
Activated Clotting Time: 286 seconds
Activated Clotting Time: 275 seconds

## 2014-02-18 LAB — GLUCOSE, CAPILLARY
GLUCOSE-CAPILLARY: 123 mg/dL — AB (ref 70–99)
Glucose-Capillary: 92 mg/dL (ref 70–99)
Glucose-Capillary: 125 mg/dL — ABNORMAL HIGH (ref 70–99)
Glucose-Capillary: 161 mg/dL — ABNORMAL HIGH (ref 70–99)

## 2014-02-18 LAB — CBC WITH DIFFERENTIAL/PLATELET
Basophils Absolute: 0 10*3/uL (ref 0.0–0.1)
Basophils Relative: 0 % (ref 0–1)
Eosinophils Absolute: 0.2 10*3/uL (ref 0.0–0.7)
Eosinophils Relative: 2 % (ref 0–5)
HCT: 37 % (ref 36.0–46.0)
Hemoglobin: 11.8 g/dL — ABNORMAL LOW (ref 12.0–15.0)
LYMPHS ABS: 5.2 10*3/uL — AB (ref 0.7–4.0)
Lymphocytes Relative: 51 % — ABNORMAL HIGH (ref 12–46)
MCH: 25.5 pg — AB (ref 26.0–34.0)
MCHC: 31.9 g/dL (ref 30.0–36.0)
MCV: 80.1 fL (ref 78.0–100.0)
Monocytes Absolute: 1.1 10*3/uL — ABNORMAL HIGH (ref 0.1–1.0)
Monocytes Relative: 11 % (ref 3–12)
NEUTROS PCT: 36 % — AB (ref 43–77)
Neutro Abs: 3.6 10*3/uL (ref 1.7–7.7)
PLATELETS: 285 10*3/uL (ref 150–400)
RBC: 4.62 MIL/uL (ref 3.87–5.11)
RDW: 14.5 % (ref 11.5–15.5)
WBC: 10.1 10*3/uL (ref 4.0–10.5)

## 2014-02-18 LAB — I-STAT TROPONIN, ED: TROPONIN I, POC: 0 ng/mL (ref 0.00–0.08)

## 2014-02-18 LAB — PRO B NATRIURETIC PEPTIDE
PRO B NATRI PEPTIDE: 405.6 pg/mL (ref 0–450)
Pro B Natriuretic peptide (BNP): 109 pg/mL (ref 0–450)

## 2014-02-18 LAB — MRSA PCR SCREENING: MRSA BY PCR: NEGATIVE

## 2014-02-18 LAB — TROPONIN I
Troponin I: >20 ng/mL (ref <0.30)
Troponin I: >20 ng/mL (ref <0.30)

## 2014-02-18 LAB — HEMOGLOBIN A1C
Hgb A1c MFr Bld: 6.6 % — ABNORMAL HIGH (ref <5.7)
MEAN PLASMA GLUCOSE: 143 mg/dL — AB (ref <117)

## 2014-02-18 IMAGING — CR DG CHEST 1V PORT
1 series · 1 of 1 positions shown · non-contrast
Comparison: [DATE]

CLINICAL DATA: Chest pain.

EXAM:
PORTABLE CHEST - 1 VIEW

[AP]
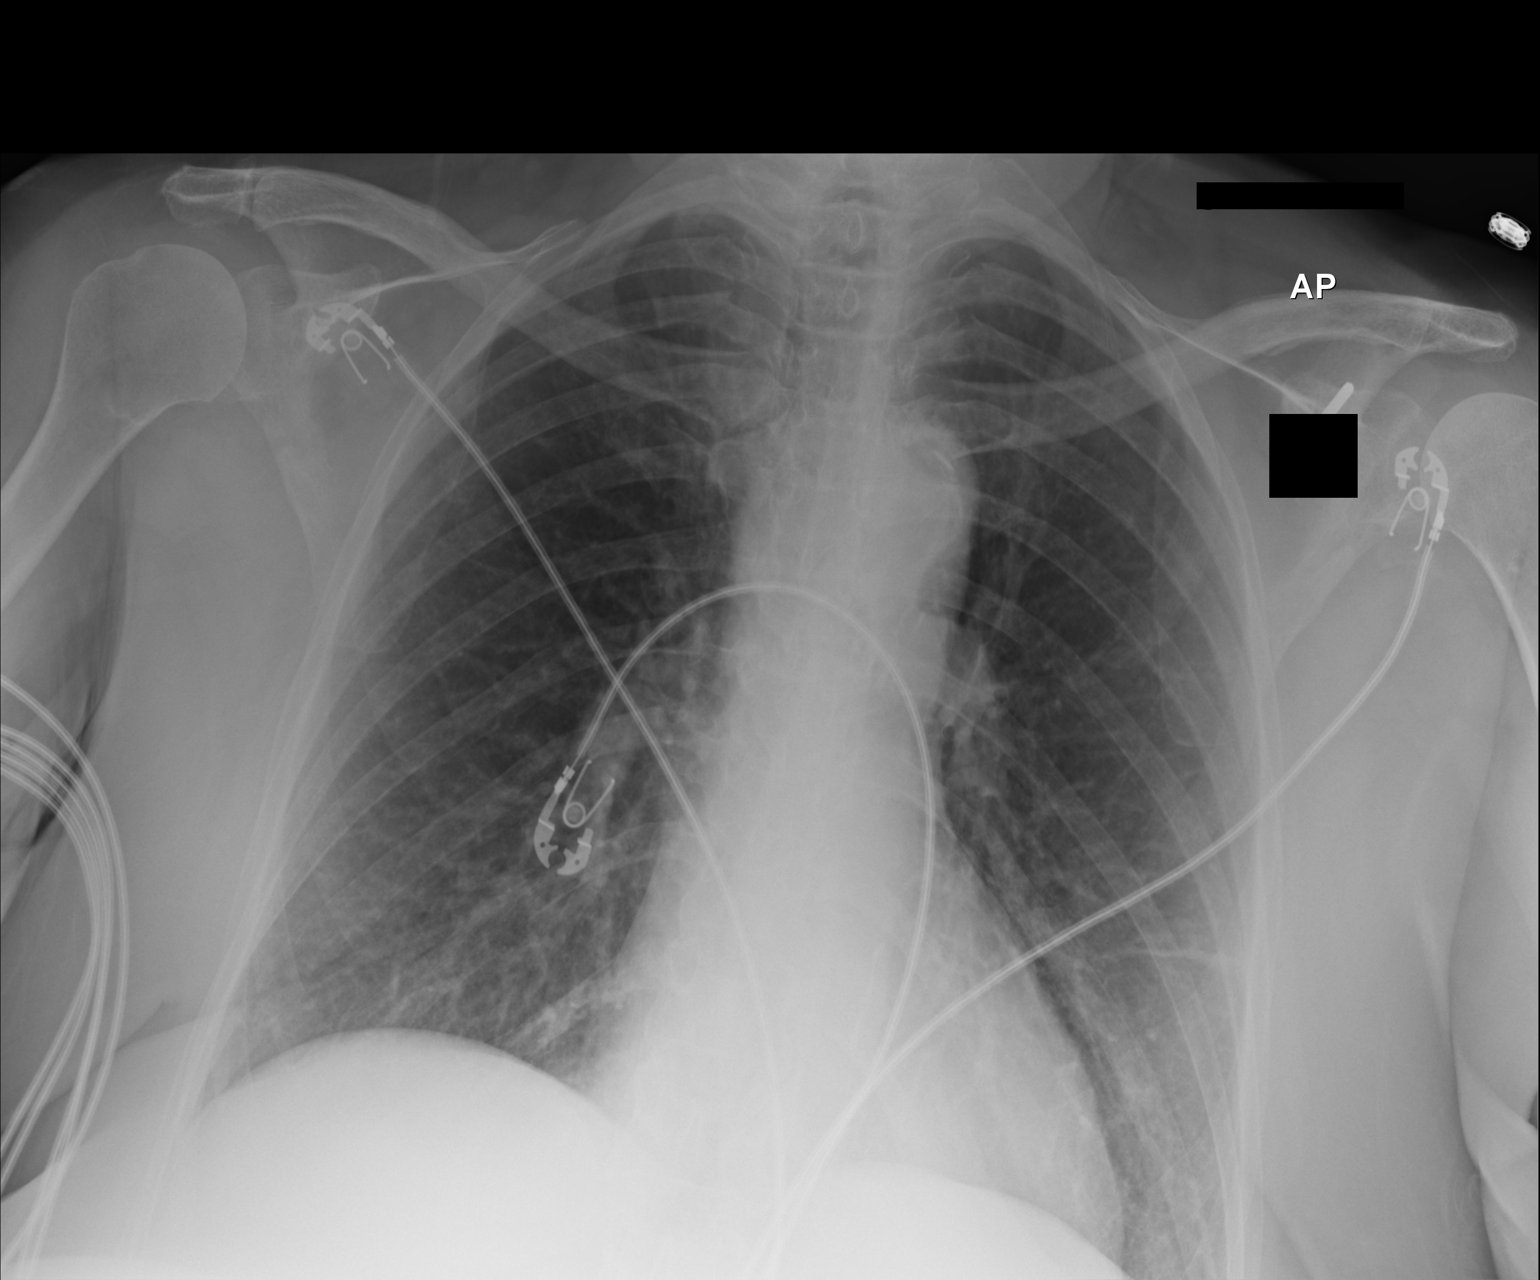

[1 of 1 positions shown; findings below may reference images not displayed]

FINDINGS: Normal heart size and mediastinal contours. Mild atelectasis in the
left mid chest. No consolidation or edema. No effusion or
pneumothorax. No acute osseous findings.
IMPRESSION: 1.  No acute cardiopulmonary disease.
2. Minimal atelectasis on the left.

## 2014-02-18 SURGERY — LEFT HEART CATHETERIZATION WITH CORONARY ANGIOGRAM
Anesthesia: LOCAL

## 2014-02-18 MED ORDER — LIDOCAINE HCL (PF) 1 % IJ SOLN
INTRAMUSCULAR | Status: AC
  Filled 2014-02-18: qty 30

## 2014-02-18 MED ORDER — HEPARIN SODIUM (PORCINE) 1000 UNIT/ML IJ SOLN
INTRAMUSCULAR | Status: AC
  Filled 2014-02-18: qty 1

## 2014-02-18 MED ORDER — SODIUM CHLORIDE 0.9 % IV SOLN: 250.0000 mL | INTRAVENOUS | Status: DC | PRN

## 2014-02-18 MED ORDER — TIOTROPIUM BROMIDE MONOHYDRATE 18 MCG IN CAPS
18.0000 ug | ORAL_CAPSULE | Freq: Every day | RESPIRATORY_TRACT | Status: DC
Start: 2014-02-18 — End: 2014-02-21
  Administered 2014-02-19 – 2014-02-21 (×3): 18 ug via RESPIRATORY_TRACT
  Filled 2014-02-18: qty 5

## 2014-02-18 MED ORDER — ACETAMINOPHEN 325 MG PO TABS: 650.0000 mg | ORAL_TABLET | ORAL | Status: DC | PRN

## 2014-02-18 MED ORDER — MIDAZOLAM HCL 2 MG/2ML IJ SOLN
INTRAMUSCULAR | Status: AC
  Filled 2014-02-18: qty 2

## 2014-02-18 MED ORDER — HYDROCHLOROTHIAZIDE 25 MG PO TABS
25.0000 mg | ORAL_TABLET | Freq: Every day | ORAL | Status: DC
  Administered 2014-02-18 – 2014-02-20 (×3): 25 mg via ORAL
  Filled 2014-02-18 (×4): qty 1

## 2014-02-18 MED ORDER — POTASSIUM CHLORIDE CRYS ER 20 MEQ PO TBCR
20.0000 meq | EXTENDED_RELEASE_TABLET | Freq: Every day | ORAL | Status: DC
  Administered 2014-02-18 – 2014-02-20 (×3): 20 meq via ORAL
  Filled 2014-02-18 (×5): qty 1

## 2014-02-18 MED ORDER — HYDROMORPHONE HCL PF 1 MG/ML IJ SOLN
1.0000 mg | INTRAMUSCULAR | Status: DC | PRN
  Administered 2014-02-18: 1 mg via INTRAVENOUS
  Administered 2014-02-18: 2 mg via INTRAVENOUS
  Administered 2014-02-20: 1 mg via INTRAVENOUS
  Filled 2014-02-18: qty 1
  Filled 2014-02-18: qty 2

## 2014-02-18 MED ORDER — FENOFIBRATE 54 MG PO TABS
54.0000 mg | ORAL_TABLET | Freq: Every day | ORAL | Status: DC
  Administered 2014-02-18: 54 mg via ORAL
  Filled 2014-02-18: qty 1

## 2014-02-18 MED ORDER — INSULIN ASPART 100 UNIT/ML ~~LOC~~ SOLN
0.0000 [IU] | Freq: Three times a day (TID) | SUBCUTANEOUS | Status: DC
  Administered 2014-02-18 – 2014-02-19 (×3): 3 [IU] via SUBCUTANEOUS
  Administered 2014-02-20: 2 [IU] via SUBCUTANEOUS

## 2014-02-18 MED ORDER — ASPIRIN 81 MG PO TABS: 81.0000 mg | ORAL_TABLET | Freq: Every day | ORAL | Status: DC

## 2014-02-18 MED ORDER — ZOLPIDEM TARTRATE 5 MG PO TABS
5.0000 mg | ORAL_TABLET | Freq: Every evening | ORAL | Status: DC | PRN
  Administered 2014-02-18 – 2014-02-20 (×3): 5 mg via ORAL
  Filled 2014-02-18 (×3): qty 1

## 2014-02-18 MED ORDER — SODIUM CHLORIDE 0.9 % IJ SOLN: 3.0000 mL | INTRAMUSCULAR | Status: DC | PRN

## 2014-02-18 MED ORDER — MOMETASONE FURO-FORMOTEROL FUM 100-5 MCG/ACT IN AERO
2.0000 | INHALATION_SPRAY | Freq: Two times a day (BID) | RESPIRATORY_TRACT | Status: DC
  Administered 2014-02-18 – 2014-02-21 (×6): 2 via RESPIRATORY_TRACT
  Filled 2014-02-18: qty 8.8

## 2014-02-18 MED ORDER — ONDANSETRON HCL 4 MG/2ML IJ SOLN
4.0000 mg | Freq: Once | INTRAMUSCULAR | Status: DC
  Filled 2014-02-18: qty 2

## 2014-02-18 MED ORDER — HEPARIN (PORCINE) IN NACL 2-0.9 UNIT/ML-% IJ SOLN
INTRAMUSCULAR | Status: AC
  Filled 2014-02-18: qty 1500

## 2014-02-18 MED ORDER — VITAMIN E 180 MG (400 UNIT) PO CAPS
400.0000 [IU] | ORAL_CAPSULE | Freq: Every day | ORAL | Status: DC
  Administered 2014-02-18 – 2014-02-20 (×3): 400 [IU] via ORAL
  Filled 2014-02-18 (×4): qty 1

## 2014-02-18 MED ORDER — FLUTICASONE PROPIONATE 50 MCG/ACT NA SUSP
1.0000 | Freq: Every day | NASAL | Status: DC
  Administered 2014-02-18 – 2014-02-20 (×3): 1 via NASAL
  Filled 2014-02-18: qty 16

## 2014-02-18 MED ORDER — ASPIRIN 81 MG PO CHEW: 81.0000 mg | CHEWABLE_TABLET | Freq: Every day | ORAL | Status: DC

## 2014-02-18 MED ORDER — INSULIN ASPART 100 UNIT/ML ~~LOC~~ SOLN: 0.0000 [IU] | Freq: Every day | SUBCUTANEOUS | Status: DC

## 2014-02-18 MED ORDER — NITROGLYCERIN 0.4 MG SL SUBL
0.4000 mg | SUBLINGUAL_TABLET | SUBLINGUAL | Status: DC | PRN
  Administered 2014-02-18: 0.4 mg via SUBLINGUAL
  Filled 2014-02-18: qty 1

## 2014-02-18 MED ORDER — TIROFIBAN HCL IV 5 MG/100ML
INTRAVENOUS | Status: AC
  Filled 2014-02-18: qty 100

## 2014-02-18 MED ORDER — PREDNISONE 20 MG PO TABS
20.0000 mg | ORAL_TABLET | Freq: Two times a day (BID) | ORAL | Status: DC
  Administered 2014-02-19 – 2014-02-21 (×4): 20 mg via ORAL
  Filled 2014-02-18 (×8): qty 1

## 2014-02-18 MED ORDER — FENTANYL CITRATE 0.05 MG/ML IJ SOLN: 25.0000 ug | Freq: Once | INTRAMUSCULAR | Status: DC

## 2014-02-18 MED ORDER — INSULIN GLARGINE 100 UNIT/ML SOLOSTAR PEN: 15.0000 [IU] | PEN_INJECTOR | Freq: Every morning | SUBCUTANEOUS | Status: DC

## 2014-02-18 MED ORDER — PANTOPRAZOLE SODIUM 40 MG PO TBEC
40.0000 mg | DELAYED_RELEASE_TABLET | Freq: Every day | ORAL | Status: DC
  Administered 2014-02-18 – 2014-02-20 (×3): 40 mg via ORAL
  Filled 2014-02-18 (×3): qty 1

## 2014-02-18 MED ORDER — ALPRAZOLAM 0.25 MG PO TABS
0.2500 mg | ORAL_TABLET | Freq: Two times a day (BID) | ORAL | Status: DC | PRN
  Administered 2014-02-18 – 2014-02-21 (×5): 0.25 mg via ORAL
  Filled 2014-02-18 (×5): qty 1

## 2014-02-18 MED ORDER — INSULIN GLARGINE 100 UNIT/ML ~~LOC~~ SOLN
15.0000 [IU] | Freq: Every day | SUBCUTANEOUS | Status: DC
  Administered 2014-02-18 – 2014-02-20 (×3): 15 [IU] via SUBCUTANEOUS
  Filled 2014-02-18 (×4): qty 0.15

## 2014-02-18 MED ORDER — TRAMADOL HCL 50 MG PO TABS
50.0000 mg | ORAL_TABLET | Freq: Four times a day (QID) | ORAL | Status: DC | PRN
  Administered 2014-02-18 – 2014-02-21 (×2): 50 mg via ORAL
  Filled 2014-02-18 (×2): qty 1

## 2014-02-18 MED ORDER — MORPHINE SULFATE 2 MG/ML IJ SOLN
2.0000 mg | Freq: Once | INTRAMUSCULAR | Status: DC
  Filled 2014-02-18: qty 1

## 2014-02-18 MED ORDER — GLIMEPIRIDE 2 MG PO TABS
2.0000 mg | ORAL_TABLET | Freq: Two times a day (BID) | ORAL | Status: DC
  Administered 2014-02-18 – 2014-02-21 (×5): 2 mg via ORAL
  Filled 2014-02-18 (×8): qty 1

## 2014-02-18 MED ORDER — SODIUM CHLORIDE 0.9 % IV SOLN
1.0000 mL/kg/h | INTRAVENOUS | Status: AC
  Administered 2014-02-18: 1 mL/kg/h via INTRAVENOUS

## 2014-02-18 MED ORDER — ASPIRIN EC 81 MG PO TBEC
81.0000 mg | DELAYED_RELEASE_TABLET | Freq: Every day | ORAL | Status: DC
  Administered 2014-02-18 – 2014-02-20 (×3): 81 mg via ORAL
  Filled 2014-02-18 (×4): qty 1

## 2014-02-18 MED ORDER — METOPROLOL TARTRATE 100 MG PO TABS
100.0000 mg | ORAL_TABLET | Freq: Every day | ORAL | Status: DC
  Administered 2014-02-18 – 2014-02-20 (×3): 100 mg via ORAL
  Filled 2014-02-18 (×4): qty 1

## 2014-02-18 MED ORDER — MIDAZOLAM HCL 2 MG/2ML IJ SOLN
INTRAMUSCULAR | Status: AC
Start: 2014-02-18 — End: 2014-02-18
  Filled 2014-02-18: qty 2

## 2014-02-18 MED ORDER — ASPIRIN EC 81 MG PO TBEC
81.0000 mg | DELAYED_RELEASE_TABLET | Freq: Every day | ORAL | Status: DC
Start: 2014-02-19 — End: 2014-02-18

## 2014-02-18 MED ORDER — ASPIRIN 81 MG PO CHEW
324.0000 mg | CHEWABLE_TABLET | Freq: Once | ORAL | Status: AC
  Administered 2014-02-18: 324 mg via ORAL
  Filled 2014-02-18: qty 4

## 2014-02-18 MED ORDER — NITROGLYCERIN 1 MG/10 ML FOR IR/CATH LAB
INTRA_ARTERIAL | Status: AC
  Filled 2014-02-18: qty 10

## 2014-02-18 MED ORDER — HEPARIN (PORCINE) IN NACL 100-0.45 UNIT/ML-% IJ SOLN
1000.0000 [IU]/h | INTRAMUSCULAR | Status: DC
  Administered 2014-02-18: 1000 [IU]/h via INTRAVENOUS
  Filled 2014-02-18: qty 250

## 2014-02-18 MED ORDER — HEPARIN BOLUS VIA INFUSION
3000.0000 [IU] | Freq: Once | INTRAVENOUS | Status: AC
  Administered 2014-02-18: 3000 [IU] via INTRAVENOUS
  Filled 2014-02-18: qty 3000

## 2014-02-18 MED ORDER — SALINE SPRAY 0.65 % NA SOLN: 1.0000 | NASAL | Status: DC | PRN

## 2014-02-18 MED ORDER — VERAPAMIL HCL 2.5 MG/ML IV SOLN
INTRAVENOUS | Status: AC
  Filled 2014-02-18: qty 2

## 2014-02-18 MED ORDER — ONDANSETRON HCL 4 MG/2ML IJ SOLN: 4.0000 mg | Freq: Four times a day (QID) | INTRAMUSCULAR | Status: DC | PRN

## 2014-02-18 MED ORDER — ALBUTEROL SULFATE (2.5 MG/3ML) 0.083% IN NEBU
2.5000 mg | INHALATION_SOLUTION | Freq: Four times a day (QID) | RESPIRATORY_TRACT | Status: DC | PRN
  Administered 2014-02-20 – 2014-02-21 (×2): 2.5 mg via RESPIRATORY_TRACT
  Filled 2014-02-18 (×3): qty 3

## 2014-02-18 MED ORDER — ATROPINE SULFATE 0.1 MG/ML IJ SOLN
INTRAMUSCULAR | Status: AC
Start: 2014-02-18 — End: 2014-02-18
  Filled 2014-02-18: qty 10

## 2014-02-18 MED ORDER — SODIUM CHLORIDE 0.9 % IJ SOLN: 3.0000 mL | Freq: Two times a day (BID) | INTRAMUSCULAR | Status: DC

## 2014-02-18 MED ORDER — ATORVASTATIN CALCIUM 40 MG PO TABS
40.0000 mg | ORAL_TABLET | Freq: Every day | ORAL | Status: DC
  Filled 2014-02-18: qty 1

## 2014-02-18 MED ORDER — SODIUM CHLORIDE 0.9 % IV SOLN: 1.0000 mL/kg/h | INTRAVENOUS | Status: DC

## 2014-02-18 MED ORDER — ENALAPRIL MALEATE 20 MG PO TABS
20.0000 mg | ORAL_TABLET | Freq: Two times a day (BID) | ORAL | Status: DC
  Administered 2014-02-18 – 2014-02-20 (×5): 20 mg via ORAL
  Filled 2014-02-18 (×8): qty 1

## 2014-02-18 MED ORDER — ATORVASTATIN CALCIUM 80 MG PO TABS
80.0000 mg | ORAL_TABLET | Freq: Every day | ORAL | Status: DC
  Administered 2014-02-18 – 2014-02-19 (×2): 80 mg via ORAL
  Filled 2014-02-18 (×3): qty 1

## 2014-02-18 MED ORDER — FENTANYL CITRATE 0.05 MG/ML IJ SOLN
INTRAMUSCULAR | Status: AC
  Filled 2014-02-18: qty 2

## 2014-02-18 MED ORDER — METOPROLOL-HYDROCHLOROTHIAZIDE 100-25 MG PO TABS: 1.0000 | ORAL_TABLET | Freq: Every day | ORAL | Status: DC

## 2014-02-18 MED ORDER — TICAGRELOR 90 MG PO TABS
90.0000 mg | ORAL_TABLET | Freq: Two times a day (BID) | ORAL | Status: DC
  Administered 2014-02-18 – 2014-02-20 (×5): 90 mg via ORAL
  Filled 2014-02-18 (×7): qty 1

## 2014-02-18 MED ORDER — TICAGRELOR 90 MG PO TABS
ORAL_TABLET | ORAL | Status: AC
  Filled 2014-02-18: qty 2

## 2014-02-18 MED ORDER — HYDROMORPHONE HCL PF 1 MG/ML IJ SOLN
1.0000 mg | INTRAMUSCULAR | Status: DC | PRN
  Administered 2014-02-18 (×2): 1 mg via INTRAVENOUS
  Filled 2014-02-18 (×3): qty 1

## 2014-02-18 NOTE — CV Procedure (Addendum)
PROCEDURE:  Left heart catheterization with selective coronary angiography, left ventriculogram. PCI RCA; aspiration thrombectomy of the RCA.  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  INDICATIONS:  Acute Inferior MI in a patient with ongoing, active chest pain- brought to the Cath Lab emergently for angiography and intervention.  The risks, benefits, and details of the procedure were explained to the patient.  The patient verbalized understanding and wanted to proceed. Emergency, implied consent was obtained.  PROCEDURE TECHNIQUE:  After Xylocaine anesthesia a 34F slender sheath was placed in the right radial artery with a single anterior needle wall stick.   Right coronary angiography was done using a Judkins R4 guide catheter.  Left coronary angiography was done using a Judkins L3.5 guide catheter.  Left ventriculography was done using a pigtail catheter.  The intervention was performed. Please see below for details. A TR band was used for hemostasis.   CONTRAST:  Total of 160 cc.  COMPLICATIONS:  None.    HEMODYNAMICS:  Aortic pressure was 136/68; LV pressure was 139/10; LVEDP 21.  There was no gradient between the left ventricle and aorta.    ANGIOGRAPHIC DATA:   The left main coronary artery is widely patent.  The left anterior descending artery is a large vessel which wraps around the apex. There is mild disease in the mid vessel after the origin of a large diagonal area.  The distal RCA system fills by left to right collaterals.  There is a large branching diagonal vessel which is widely patent.  The left circumflex artery is a large vessel. There is mild, diffuse atherosclerosis. There is a large second obtuse marginal. The first obtuse marginal is small but patent.  The right coronary artery is occluded in the midportion. After revascularization, it is noted that there is a very large aneurysm, likely at the prior angioplasty  site in the mid RCA, just past the occlusion. There is severe lesions just before and after the aneurysm. There is moderate disease in the remainder of the distal RCA. The posterior lateral artery is very large and patent.  LEFT VENTRICULOGRAM:  Left ventricular angiogram was done in the 30 RAO projection and revealed normal left ventricular wall motion and systolic function with an estimated ejection fraction of 55%.  LVEDP was 19 mmHg.  PCI NARRATIVE: IV heparin and IV tirofiban were used for anticoagulation. ACT was used to check that the heparin was therapeutic. A JR 4 guiding catheter was used to engage the RCA. A pro-water wire was advanced to the distal RCA with little difficulty. It was noted that there was some coiling of the wire in the mid RCA. It was redirected and then went to the distal vessel. Aspiration thrombectomy was performed. A 2.5 balloon was advanced to the mid RCA but would not cross the area of stenosis. It was deployed in the proximal RCA with the hopes that we could get into the lesion. After deployment, it would not advance. A 2.0 x 12 predilatation balloon was used in a similar fashion as it would not easily cross. At this point, contrast staining was noted in the mid RCA. It appeared to be an aneurysm but to make sure that we had not perforated, a stat echocardiogram was obtained. A 1.2 mm balloon was then advanced and did successfully cross the area of disease in the mid RCA. The balloon was inflated to high pressure to try and make a channel.  A 1.5 noncompliant balloon was then successfully advanced and inflated to high pressure. The echocardiogram was done and showed no pericardial effusion. A 2.0 x 12 noncompliant balloon was then advanced and inflated in the area occlusion in the mid RCA. A 2.5 balloon was then inflated as well in the area of occlusion in the mid RCA. We attempted to deliver a 2.8 x 19 graft master covered stent to the mid RCA to cover the aneurysm. It would  not traverse the severe calcific disease. A mailman wire was advanced down into the RCA. Over the 2 wires, the graft master stent did travel down into the mid RCA. The mailman wire was removed.   The  Covered stent was deployed to cover the aneurysm.  A covered stent was used because there was high-risk for perforation with stenting of the junction of the aneurysm and more normal vessel, due to heavy calcification and atherosclerotic burden.  Small noncompliant balloons were required to make a channel and given the likely weakness of the vessel at the distal portion of the aneurysm, I felt a graft master stent was safe this. Given her large area of ongoing ischemia, active chest pain, if a perforation had occurred at that point, she likely would not have survived.   This was the longest stent but it was not long enough to cover the atherosclerotic disease in the more normal portions of the RCA which are both distal as well as proximal to the aneurysm.  The mailman wire was placed back in the vessel. The covered stent was post dilated with a 3.0 noncompliant balloon. A 2.75 x 20 promus drug-eluting stent was advanced to the distal edge of the graft master stent. It was deployed in overlapping fashion. A 3.0 x 8 Promus drug-eluting stent was then deployed overlapping the proximal edge of the covered stent. The entire stented segment was post dilated with a 3.25 noncompliant balloon. There was an excellent angiographic result.  The patient was restless during the procedure and required a fair amount of sedation. Overall, she tolerated the procedure well.  IMPRESSIONS:  1. Normal left main coronary artery. 2. Mild disease in the mid left anterior descending artery and its branches. 3. Mild disease in the left circumflex artery and its branches. 4. 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  Treated with a 2.8 x 19 mm Covered stent.  Distal to stent, a 2.75 x 20 mm Promus was  deployed.  Proximal to the covered stent, a 3.0 x 8 mm stent was deployed.  Entire stented area dilated to 3.3 mm in diameter.   5. Normal left ventricular systolic function.  LVEDP 19 mmHg.  Ejection fraction 55%.  RECOMMENDATION:  Continue dual antiplatelet therapy indefinitely. She'll be watched in the ICU. Continue aggressive secondary prevention.  I would continue dual antiplatelet therapy longer than usual given the covered stent.  A covered stent was used to cover the RCA aneurysm because athersclerostic disease causing MI was adjacent to the aneurysm.  If the aneurysm ruptured  during the procedure with conventional stent deployment, orout side of the hospital after our manipulation, the patient would have tamponade and possibly death.  Therefore, we elected to cover the aneurysm.  F/u with Dr. Angelena Form.

## 2014-02-18 NOTE — ED Notes (Signed)
Pt. woke up this morning with mid chest pain , SOB and nausea . Denies diaphoresis / no cough or congestion .

## 2014-02-18 NOTE — Progress Notes (Signed)
Patient continues to have pain and swelling in right arm from cath.  TR band removed and pressure applied to site for 1 hour.  Hematoma extends to almost the elbow area.  Dressed site with a 4x4 gauze and a ace bandage from her hand to her elbow.Sat probe still reading good from right hand and still good cap refill.  Dr. Irish Lack has been made aware of the site.  RN will continue to monitor.

## 2014-02-18 NOTE — Progress Notes (Signed)
ANTICOAGULATION CONSULT NOTE - Initial Consult  Pharmacy Consult for heparin Indication: chest pain/ACS  Allergies  Allergen Reactions  . Codeine Sulfate Nausea Only  . Morphine Sulfate Nausea Only    Patient Measurements: Height: 5\' 5"  (165.1 cm) Weight: 175 lb (79.379 kg) IBW/kg (Calculated) : 57 Heparin Dosing Weight: 75kg  Vital Signs: Temp: 97.4 F (36.3 C) (08/04 0440) Temp src: Oral (08/04 0440) BP: 112/49 mmHg (08/04 0615) Pulse Rate: 75 (08/04 0615)  Labs:  Recent Labs  02/18/14 0506  HGB 11.8*  HCT 37.0  PLT 285  CREATININE 1.25*    Estimated Creatinine Clearance: 38 ml/min (by C-G formula based on Cr of 1.25).   Medical History: Past Medical History  Diagnosis Date  . COPD (chronic obstructive pulmonary disease)   . CAD (coronary artery disease)     Cardiac cath 1990 with Dr. Lia Foyer and [name] No cath since 1990  . Hyperlipidemia   . Hypertension   . Diabetes mellitus   . AV block, 1st degree     Assessment: 80yo female woke up w/ mid CP associate w/ SOB and nausea, initial troponin negative, to begin heparin.  Goal of Therapy:  Heparin level 0.3-0.7 units/ml Monitor platelets by anticoagulation protocol: Yes   Plan:  Will give heparin 3000 units IV bolus x1 followed by gtt at 1000 units/hr and monitor heparin levels and CBC.  Wynona Neat, PharmD, BCPS  02/18/2014,6:31 AM

## 2014-02-18 NOTE — Progress Notes (Signed)
UR Completed.  Herrick Hartog Jane [phone] 02/18/2014  

## 2014-02-18 NOTE — Progress Notes (Signed)
MD notified of patients pain level, complaining of pain at radial site.  New orders received.   MD also notified that hand remains blue, not able to take any air out of TR band at this time due to patient oozing blood.  MD said he would come look at hand and radial site.  Will continue to monitor patient closely.

## 2014-02-18 NOTE — Interval H&P Note (Signed)
History and Physical Interval Note:  02/18/2014 8:09 AM  Michelle [name]  has presented today for surgery, with the diagnosis of urgent  The various methods of treatment have been discussed with the patient and family. After consideration of risks, benefits and other options for treatment, the patient has consented to  Procedure(s): LEFT HEART CATHETERIZATION WITH CORONARY ANGIOGRAM (N/A) as a surgical intervention .  The patient's history has been reviewed, patient examined, no change in status, stable for surgery.  I have reviewed the patient's chart and labs.  Questions were answered to the patient's satisfaction.     Michelle [name] W  Cath Lab Visit (complete for each Cath Lab visit)  Clinical Evaluation Leading to the Procedure:   ACS: Yes.    Non-ACS:    Anginal Classification: CCS IV  Anti-ischemic medical therapy: Maximal Therapy (2 or more classes of medications)  Non-Invasive Test Results: No non-invasive testing performed  Prior CABG: No previous CABG

## 2014-02-18 NOTE — H&P (Signed)
ADMISSION HISTORY & PHYSICAL   Chief Complaint:  Chest Pain  Cardiologist: Estevan Ryder, MD  Primary Care Physician: Chancy Hurter, MD  HPI:  This is a 80 y.o. female with a past medical history significant for known CAD with prior PTCA of the RCA in 1990 by Dr. Lia Foyer with no further coronary evaluation since. She also has diabetes with insulin listed, essential hypertension and hyperlipidemia. She also has COPD he quit smoking several years ago. She's been noticing some increasing episodes of dyspnea that required ER evaluation most recently on July 26. She has not had any resting or exertional chest pain until this morning when she awoke at roughly 4 in the morning with substernal burning pressure that radiated to her jaw. She did note dyspnea associated with this and has had mild nausea, but not currently. She then came into the [facility] is morning and received sublingual nitroglycerin that proceeded to cause significant hypotension with blood pressures in the 70s. Did alleviate her pain to some extent reducing it from 1010 2 roughly 4/10. She continues to have intermittent processes of worsening symptoms.  She did state that when she got to walk around this morning that did exacerbate her symptoms.  She tried to use her inhaler this morning and that only seemed to make her symptoms worse.  Cardiovascular ROS: positive for - chest pain, dyspnea on exertion and orthopnea negative for - edema, irregular heartbeat, loss of consciousness, murmur, palpitations, paroxysmal nocturnal dyspnea, rapid heart rate or TIA/amaurosis fugax, melena, hematochezia, hematuria, epistaxis, claudication   PMHx:  Past Medical History  Diagnosis Date  . COPD (chronic obstructive pulmonary disease)   . CAD (coronary artery disease)     Cardiac cath 1990 with Dr. Lia Foyer and pt reports blockage in artery  with angioplasty. She has pictures that show severe stenosis mid RCA and a post PTCA picture  with 30% residual stenosis post PTCA. No cath since 1990  . Hyperlipidemia with target LDL less than 70   . Essential hypertension   . Diabetes mellitus type 2, insulin dependent   . AV block, 1st degree     Past Surgical History  Procedure Laterality Date  . Cholecystectomy    . Cataract extraction  2010  . Ptca  1990    PTCA of RCA  . Transthoracic echocardiogram  02/02/2012    mild LVH, EF 55-60%, Normal WM, Gr 1 DD; Mild MR    FAMHx:  Family History  Problem Relation Age of Onset  . Heart attack Mother 56  . Cancer Father 28    SOCHx:   reports that she quit smoking about 7 years ago. Her smoking use included Cigarettes. She has a 55 pack-year smoking history. She does not have any smokeless tobacco history on file. She reports that she does not drink alcohol or use illicit drugs.  ALLERGIES:  Allergies  Allergen Reactions  . Codeine Sulfate Nausea Only  . Morphine Sulfate Nausea Only    ROS: Review of Systems  Constitutional: Negative for fever, chills, weight loss and malaise/fatigue.  HENT: Negative.        Currently has a headache after nitroglycerin  Respiratory: Positive for shortness of breath. Negative for hemoptysis and sputum production.        Mild chronic cough but nonproductive; occasionally wheezes  Cardiovascular: Positive for chest pain. Negative for palpitations, orthopnea, claudication, leg swelling and PND.  Gastrointestinal: Positive for abdominal pain. Negative for blood in stool and melena.  Left lower quadrant discomfort  Genitourinary: Negative for hematuria.  Musculoskeletal: Negative.   Neurological: Negative for dizziness, sensory change, speech change, focal weakness, seizures and loss of consciousness.  Endo/Heme/Allergies: Negative.   All other systems reviewed and are negative.  HOME MEDS: No current facility-administered medications on file prior to encounter.   Current Outpatient Prescriptions on File Prior to Encounter    Medication Sig Dispense Refill  . albuterol (PROVENTIL) (2.5 MG/3ML) 0.083% nebulizer solution Take 2.5 mg by nebulization every 6 (six) hours as needed for wheezing or shortness of breath.      Marland Kitchen amLODipine (NORVASC) 10 MG tablet Take 10 mg by mouth daily.      Marland Kitchen aspirin 81 MG tablet Take 81 mg by mouth daily.        . enalapril (VASOTEC) 20 MG tablet Take 20 mg by mouth 2 (two) times daily.      . fenofibrate micronized (LOFIBRA) 134 MG capsule Take 134 mg by mouth daily before breakfast.      . fish oil-omega-3 fatty acids 1000 MG capsule Take 2 g by mouth daily.        . fluticasone (FLONASE) 50 MCG/ACT nasal spray Place 1 spray into both nostrils daily.  16 g  2  . Fluticasone-Salmeterol (ADVAIR) 250-50 MCG/DOSE AEPB Inhale 1 puff into the lungs 2 (two) times daily.      . folic acid (FOLVITE) 094 MCG tablet Take 400 mcg by mouth daily.        Marland Kitchen glimepiride (AMARYL) 2 MG tablet Take 2 mg by mouth 2 (two) times daily.      . Insulin Glargine (LANTUS SOLOSTAR) 100 UNIT/ML Solostar Pen Inject 15 Units into the skin every morning.       . metFORMIN (GLUCOPHAGE) 1000 MG tablet Take 1,000 mg by mouth 2 (two) times daily with a meal.      . metoprolol-hydrochlorothiazide (LOPRESSOR HCT) 100-25 MG per tablet Take 1 tablet by mouth daily.      Marland Kitchen omeprazole (PRILOSEC) 20 MG capsule Take 20 mg by mouth daily.      . potassium chloride SA (K-DUR,KLOR-CON) 20 MEQ tablet Take 20 mEq by mouth daily.      . predniSONE (DELTASONE) 10 MG tablet Take 2 tablets (20 mg total) by mouth 2 (two) times daily.  20 tablet  0  . rosuvastatin (CRESTOR) 40 MG tablet Take 40 mg by mouth daily.      . sodium chloride (OCEAN) 0.65 % SOLN nasal spray Place 1 spray into both nostrils as needed for congestion.  15 mL  0  . tiotropium (SPIRIVA) 18 MCG inhalation capsule Place 18 mcg into inhaler and inhale daily.      . traMADol (ULTRAM) 50 MG tablet Take 50 mg by mouth every 6 (six) hours as needed for moderate pain.      .  vitamin E 400 UNIT capsule Take 400 Units by mouth daily.        Marland Kitchen azithromycin (ZITHROMAX Z-PAK) 250 MG tablet 2 po day one, then 1 daily x 4 days  6 tablet  0    LABS/IMAGING: Results for orders placed during the hospital encounter of 02/18/14 (from the past 48 hour(s))  BASIC METABOLIC PANEL     Status: Abnormal   Collection Time    02/18/14  5:06 AM      Result Value Ref Range   Sodium 138  137 - 147 mEq/L   Potassium 3.5 (*) 3.7 - 5.3 mEq/L  Chloride 101  96 - 112 mEq/L   CO2 23  19 - 32 mEq/L   Glucose, Bld 95  70 - 99 mg/dL   BUN 40 (*) 6 - 23 mg/dL   Creatinine, Ser 1.25 (*) 0.50 - 1.10 mg/dL   Calcium 9.1  8.4 - 10.5 mg/dL   GFR calc non Af Amer 40 (*) >90 mL/min   GFR calc Af Amer 46 (*) >90 mL/min   Comment: (NOTE)     The eGFR has been calculated using the CKD EPI equation.     This calculation has not been validated in all clinical situations.     eGFR's persistently <90 mL/min signify possible Chronic Kidney     Disease.   Anion gap 14  5 - 15  PRO B NATRIURETIC PEPTIDE     Status: None   Collection Time    02/18/14  5:06 AM      Result Value Ref Range   Pro B Natriuretic peptide (BNP) 109.0  0 - 450 pg/mL  CBC WITH DIFFERENTIAL     Status: Abnormal   Collection Time    02/18/14  5:06 AM      Result Value Ref Range   WBC 10.1  4.0 - 10.5 K/uL   RBC 4.62  3.87 - 5.11 MIL/uL   Hemoglobin 11.8 (*) 12.0 - 15.0 g/dL   HCT 37.0  36.0 - 46.0 %   MCV 80.1  78.0 - 100.0 fL   MCH 25.5 (*) 26.0 - 34.0 pg   MCHC 31.9  30.0 - 36.0 g/dL   RDW 14.5  11.5 - 15.5 %   Platelets 285  150 - 400 K/uL   Neutrophils Relative % 36 (*) 43 - 77 %   Neutro Abs 3.6  1.7 - 7.7 K/uL   Lymphocytes Relative 51 (*) 12 - 46 %   Lymphs Abs 5.2 (*) 0.7 - 4.0 K/uL   Monocytes Relative 11  3 - 12 %   Monocytes Absolute 1.1 (*) 0.1 - 1.0 K/uL   Eosinophils Relative 2  0 - 5 %   Eosinophils Absolute 0.2  0.0 - 0.7 K/uL   Basophils Relative 0  0 - 1 %   Basophils Absolute 0.0  0.0 - 0.1  K/uL  I-STAT TROPOININ, ED     Status: None   Collection Time    02/18/14  5:15 AM      Result Value Ref Range   Troponin i, poc 0.00  0.00 - 0.08 ng/mL   Comment 3            Comment: Due to the release kinetics of cTnI,     a negative result within the first hours     of the onset of symptoms does not rule out     myocardial infarction with certainty.     If myocardial infarction is still suspected,     repeat the test at appropriate intervals.   Dg Chest Port 1 View  02/18/2014   CLINICAL DATA:  Chest pain.  EXAM: PORTABLE CHEST - 1 VIEW  COMPARISON:  02/09/2014  FINDINGS: Normal heart size and mediastinal contours. Mild atelectasis in the left mid chest. No consolidation or edema. No effusion or pneumothorax. No acute osseous findings.  IMPRESSION: 1.  No acute cardiopulmonary disease. 2. Minimal atelectasis on the left.   Electronically Signed   By: Jorje Guild M.D.   On: 02/18/2014 05:32   EXAM: VITALS: Post nitroglycerin blood pressure went to  71/40 mmHg Filed Vitals:   02/18/14 0700  BP: 119/50  Pulse: 84  Temp:   Resp: 15   General appearance: alert, cooperative, appears stated age, moderate distress, moderately obese, pale and Groaning with pain Neck: no adenopathy, no carotid bruit, supple, symmetrical, trachea midline and 2+ JVD Lungs: Nonlabored with mild bibasilar crackles that clear with cough. Otherwise CTA B. no W./R./R. Heart: regular rate and rhythm, S1, S2 normal, no murmur, click, rub or gallop and normal apical impulse Abdomen: soft, non-tender; bowel sounds normal; no masses,  no organomegaly Extremities: extremities normal, atraumatic, no cyanosis or edema and no ulcers, gangrene or trophic changes Pulses: 2+ and symmetric Neurologic: Cranial nerves: normal 2 through 12 grossly intact; otherwise nonfocal  EKG: Sinus rhythm - 70, first-degree block; T wave inversions in 1 and aVL not present on EKG from 02/09/2014   IMPRESSION: Principal Problem:    Acute coronary syndrome Active Problems:   Atherosclerotic heart disease of native coronary artery with unstable angina pectoris   Unstable angina   Diabetes mellitus type 2, insulin dependent   Hyperlipidemia with target LDL less than 70   Essential hypertension   PLAN: Based on her presentation waking from sleep with severe substernal chest discomfort in a patient with multiple cardiac risk factors and known coronary disease.'Her symptoms are consistent with unstable angina and acute coronary syndrome.  At this point, with ongoing chest pain I am concerned that she could potentially have an occluded or subtotally occluded coronary artery.  I have asked to have her taken to the cardiac catheterization lab for urgent catheterization to delineate her anatomy.   He will need to be on her home medications which have not yet been entered. She is on multiple medications including an ACE inhibitor, beta blocker and statin as well as her diabetes medications.  Was given a full medication reconciliation we can order her home medications.  Further plan per cardiac catheterization findings and report  Leonie Man, M.D., M.S. Interventional Cardiologist   Pager # [phone] 02/18/2014

## 2014-02-18 NOTE — ED Provider Notes (Signed)
CSN: [id number]     Arrival date & time 02/18/14  2542 History   First MD Initiated Contact with Patient 02/18/14 0454     Chief Complaint  Patient presents with  . Chest Pain     (Consider location/radiation/quality/duration/timing/severity/associated sxs/prior Treatment) Patient is a 80 y.o. female presenting with chest pain. The history is provided by the patient.  Chest Pain She noted onset at about 4 AM of severe midsternal chest pain without radiation. She is unable to describe the pain is rated at 10/10. She gave herself a breathing treatment it seemed to get worse during that treatment. Nothing seems to make it worse and nothing seems to make it better. Has underlying COPD but thinks her breathing is worse than it normally is. There's been some nausea but no vomiting. She denies diaphoresis. She has not had pain like this before. She has not done anything to try to treat it.  Past Medical History  Diagnosis Date  . COPD (chronic obstructive pulmonary disease)   . CAD (coronary artery disease)     Cardiac cath 1990 with Dr. Lia Foyer and [name] No cath since 1990  . Hyperlipidemia   . Hypertension   . Diabetes mellitus   . AV block, 1st degree    Past Surgical History  Procedure Laterality Date  . Cholecystectomy    . Cataract extraction  2010  . Ptca  1990   Family History  Problem Relation Age of Onset  . Heart attack Mother 90  . Cancer Father 23   History  Substance Use Topics  . Smoking status: Former Smoker -- 1.00 packs/day for 55 years    Types: Cigarettes    Quit date: 09/29/2006  . Smokeless tobacco: Not on file  . Alcohol Use: No   OB History   Grav Para Term Preterm Abortions TAB SAB Ect Mult Living                 Review of Systems  Cardiovascular: Positive for chest pain.  All other systems  reviewed and are negative.     Allergies  Codeine sulfate and Morphine sulfate  Home Medications   Prior to Admission medications   Medication Sig Start Date End Date Taking? Authorizing Provider  albuterol (PROVENTIL) (2.5 MG/3ML) 0.083% nebulizer solution Take 2.5 mg by nebulization every 6 (six) hours as needed for wheezing or shortness of breath.    Historical Provider, MD  amLODipine (NORVASC) 10 MG tablet Take 10 mg by mouth daily.    Historical Provider, MD  aspirin 81 MG tablet Take 81 mg by mouth daily.      Historical Provider, MD  azithromycin (ZITHROMAX Z-PAK) 250 MG tablet 2 po day one, then 1 daily x 4 days 02/09/14   Veryl Speak, MD  enalapril (VASOTEC) 20 MG tablet Take 20 mg by mouth 2 (two) times daily.    Historical Provider, MD  fenofibrate micronized (LOFIBRA) 134 MG capsule Take 134 mg by mouth daily before breakfast.    Historical Provider, MD  fish oil-omega-3 fatty acids 1000 MG capsule Take 2 g by mouth daily.      Historical Provider, MD  fluticasone (FLONASE) 50 MCG/ACT nasal spray Place 1 spray into both nostrils daily. 01/15/14   Merryl Hacker, MD  Fluticasone-Salmeterol (ADVAIR) 250-50 MCG/DOSE AEPB Inhale 1 puff into the lungs  2 (two) times daily.    Historical Provider, MD  folic acid (FOLVITE) 810 MCG tablet Take 400 mcg by mouth daily.      Historical Provider, MD  furosemide (LASIX) 20 MG tablet TAKE 2 TABLETS (40 MG TOTAL) BY MOUTH DAILY.    Lisabeth Pick, MD  glimepiride (AMARYL) 2 MG tablet Take 2 mg by mouth 2 (two) times daily.    Historical Provider, MD  Insulin Glargine (LANTUS SOLOSTAR) 100 UNIT/ML Solostar Pen Inject 15 Units into the skin every morning.     Historical Provider, MD  meloxicam (MOBIC) 7.5 MG tablet TAKE 2 TABLETS (15 MG TOTAL) BY MOUTH DAILY.    Lisabeth Pick, MD  metFORMIN (GLUCOPHAGE) 1000 MG tablet Take 1,000 mg by mouth 2 (two) times daily with a meal.    Historical Provider, MD  metoprolol-hydrochlorothiazide (LOPRESSOR  HCT) 100-25 MG per tablet Take 1 tablet by mouth daily.    Historical Provider, MD  omeprazole (PRILOSEC) 20 MG capsule Take 20 mg by mouth daily.    Historical Provider, MD  Potassium Chloride ER 20 MEQ TBCR TAKE 1 TABLET BY MOUTH ONCE A DAY    Bruce H Swords, MD  potassium chloride SA (K-DUR,KLOR-CON) 20 MEQ tablet Take 20 mEq by mouth daily.    Historical Provider, MD  predniSONE (DELTASONE) 10 MG tablet Take 2 tablets (20 mg total) by mouth 2 (two) times daily. 02/09/14   Veryl Speak, MD  rosuvastatin (CRESTOR) 40 MG tablet Take 40 mg by mouth daily.    Historical Provider, MD  sodium chloride (OCEAN) 0.65 % SOLN nasal spray Place 1 spray into both nostrils as needed for congestion. 01/15/14   Merryl Hacker, MD  tiotropium (SPIRIVA) 18 MCG inhalation capsule Place 18 mcg into inhaler and inhale daily.    Historical Provider, MD  traMADol (ULTRAM) 50 MG tablet Take 50 mg by mouth every 6 (six) hours as needed for moderate pain.    Historical Provider, MD  vitamin E 400 UNIT capsule Take 400 Units by mouth daily.      Historical Provider, MD   BP 135/59  Pulse 72  Temp(Src) 97.4 F (36.3 C) (Oral)  Resp 16  Ht 5\' 5"  (1.651 m)  Wt 175 lb (79.379 kg)  BMI 29.12 kg/m2  SpO2 100% Physical Exam  Nursing note and vitals reviewed.  80 year old female, resting comfortably and in no acute distress. Vital signs are normal. Oxygen saturation is 100%, which is normal. Head is normocephalic and atraumatic. PERRLA, EOMI. Oropharynx is clear. Neck is nontender and supple without adenopathy or JVD. Back is nontender and there is no CVA tenderness. Lungs are clear without rales, wheezes, or rhonchi. Breath sounds are somewhat distant. Chest is nontender. Heart has regular rate and rhythm without murmur. Abdomen is soft, flat, nontender without masses or hepatosplenomegaly and peristalsis is normoactive. Extremities have no cyanosis or edema, full range of motion is present. Skin is warm and dry  without rash. Neurologic: Mental status is normal, cranial nerves are intact, there are no motor or sensory deficits.  ED Course  Procedures (including critical care time) Labs Review Results for orders placed during the hospital encounter of 17/51/02  BASIC METABOLIC PANEL      Result Value Ref Range   Sodium 138  137 - 147 mEq/L   Potassium 3.5 (*) 3.7 - 5.3 mEq/L   Chloride 101  96 - 112 mEq/L   CO2 23  19 - 32 mEq/L  Glucose, Bld 95  70 - 99 mg/dL   BUN 40 (*) 6 - 23 mg/dL   Creatinine, Ser 1.25 (*) 0.50 - 1.10 mg/dL   Calcium 9.1  8.4 - 10.5 mg/dL   GFR calc non Af Amer 40 (*) >90 mL/min   GFR calc Af Amer 46 (*) >90 mL/min   Anion gap 14  5 - 15  PRO B NATRIURETIC PEPTIDE      Result Value Ref Range   Pro B Natriuretic peptide (BNP) 109.0  0 - 450 pg/mL  CBC WITH DIFFERENTIAL      Result Value Ref Range   WBC 10.1  4.0 - 10.5 K/uL   RBC 4.62  3.87 - 5.11 MIL/uL   Hemoglobin 11.8 (*) 12.0 - 15.0 g/dL   HCT 37.0  36.0 - 46.0 %   MCV 80.1  78.0 - 100.0 fL   MCH 25.5 (*) 26.0 - 34.0 pg   MCHC 31.9  30.0 - 36.0 g/dL   RDW 14.5  11.5 - 15.5 %   Platelets 285  150 - 400 K/uL   Neutrophils Relative % 36 (*) 43 - 77 %   Neutro Abs 3.6  1.7 - 7.7 K/uL   Lymphocytes Relative 51 (*) 12 - 46 %   Lymphs Abs 5.2 (*) 0.7 - 4.0 K/uL   Monocytes Relative 11  3 - 12 %   Monocytes Absolute 1.1 (*) 0.1 - 1.0 K/uL   Eosinophils Relative 2  0 - 5 %   Eosinophils Absolute 0.2  0.0 - 0.7 K/uL   Basophils Relative 0  0 - 1 %   Basophils Absolute 0.0  0.0 - 0.1 K/uL  I-STAT TROPOININ, ED      Result Value Ref Range   Troponin i, poc 0.00  0.00 - 0.08 ng/mL   Comment 3            Imaging Review Dg Chest Port 1 View  02/18/2014   CLINICAL DATA:  Chest pain.  EXAM: PORTABLE CHEST - 1 VIEW  COMPARISON:  02/09/2014  FINDINGS: Normal heart size and mediastinal contours. Mild atelectasis in the left mid chest. No consolidation or edema. No effusion or pneumothorax. No acute osseous findings.   IMPRESSION: 1.  No acute cardiopulmonary disease. 2. Minimal atelectasis on the left.   Electronically Signed   By: Jorje Guild M.D.   On: 02/18/2014 05:32     EKG Interpretation   Date/Time:  Tuesday February 18 2014 04:37:06 EDT Ventricular Rate:  70 PR Interval:  220 QRS Duration: 94 QT Interval:  374 QTC Calculation: 403 R Axis:   -23 Text Interpretation:  Sinus rhythm with 1st degree A-V block Low voltage  QRS Cannot rule out Anterior infarct , age undetermined Abnormal ECG T  wave inversion Lateral leads When compared with ECG of 02/09/2014, T wave  inversion Lateral leads is now Present Confirmed by [facility]  MD, Killian Ress  ([id number]) on 02/18/2014 4:53:52 AM      CRITICAL CARE Performed by: ZCHYI,FOYDX Total critical care time: 55 minutes Critical care time was exclusive of separately billable procedures and treating other patients. Critical care was necessary to treat or prevent imminent or life-threatening deterioration. Critical care was time spent personally by me on the following activities: development of treatment plan with patient and/or surrogate as well as nursing, discussions with consultants, evaluation of patient's response to treatment, examination of patient, obtaining history from patient or surrogate, ordering and performing treatments and interventions, ordering and review  of laboratory studies, ordering and review of radiographic studies, pulse oximetry and re-evaluation of patient's condition.  MDM   Final diagnoses:  Acute coronary syndrome    Chest pain worrisome for cardiac etiology. There is an ECG changes compared with ECG of only one week ago. She was given aspirin and nitroglycerin and laboratory workup is initiated. Old records are reviewed and she does have a history of angioplasty in 1990 with some residual disease but no catheterizations since then.  She was given a dose of nitroglycerin but blood pressure dropped down to 75 systolic. She was given IV  fluids and blood pressure came back up. She is unable to get morphine for pain because of an allergy. Troponin is come back normal the pattern of pain and ECG changes are suggestive of acute coronary syndrome. Case is discussed with cardiologist from [facility] who agrees to admit the patient.  Delora Fuel, MD 10/93/23 [id number]

## 2014-02-18 NOTE — Progress Notes (Signed)
Echo Lab  2D Echocardiogram completed.  [place], RDCS 02/18/2014 9:11 AM

## 2014-02-18 NOTE — Progress Notes (Signed)
Urgent cath.  Patient with unstable angina and actively having pain.  She has had DOE.  She is brought to the cath lab due to ongoing pain.

## 2014-02-18 NOTE — ED Notes (Signed)
Roxanne Mins, MD made aware of the pt's blood pressure, and a fluid bolus started.

## 2014-02-19 DIAGNOSIS — I517 Cardiomegaly: Secondary | ICD-10-CM

## 2014-02-19 LAB — GLUCOSE, CAPILLARY
GLUCOSE-CAPILLARY: 141 mg/dL — AB (ref 70–99)
Glucose-Capillary: 66 mg/dL — ABNORMAL LOW (ref 70–99)
Glucose-Capillary: 85 mg/dL (ref 70–99)
Glucose-Capillary: 159 mg/dL — ABNORMAL HIGH (ref 70–99)
Glucose-Capillary: 184 mg/dL — ABNORMAL HIGH (ref 70–99)

## 2014-02-19 LAB — CBC
HCT: 33.9 % — ABNORMAL LOW (ref 36.0–46.0)
HEMOGLOBIN: 10.8 g/dL — AB (ref 12.0–15.0)
MCH: 25.8 pg — ABNORMAL LOW (ref 26.0–34.0)
MCHC: 31.9 g/dL (ref 30.0–36.0)
MCV: 80.9 fL (ref 78.0–100.0)
Platelets: 277 10*3/uL (ref 150–400)
RBC: 4.19 MIL/uL (ref 3.87–5.11)
RDW: 14.7 % (ref 11.5–15.5)
WBC: 8.8 10*3/uL (ref 4.0–10.5)

## 2014-02-19 LAB — HEPATIC FUNCTION PANEL
ALBUMIN: 3 g/dL — AB (ref 3.5–5.2)
ALT: 40 U/L — ABNORMAL HIGH (ref 0–35)
AST: 113 U/L — ABNORMAL HIGH (ref 0–37)
Alkaline Phosphatase: 69 U/L (ref 39–117)
Bilirubin, Direct: <0.2 mg/dL (ref 0.0–0.3)
Total Bilirubin: 0.3 mg/dL (ref 0.3–1.2)
Total Protein: 5.9 g/dL — ABNORMAL LOW (ref 6.0–8.3)

## 2014-02-19 LAB — LIPID PANEL
CHOL/HDL RATIO: 3.4 ratio
Cholesterol: 147 mg/dL (ref 0–200)
HDL: 43 mg/dL (ref >39)
LDL Cholesterol: UNDETERMINED mg/dL (ref 0–99)
TRIGLYCERIDES: 521 mg/dL — AB (ref <150)
VLDL: UNDETERMINED mg/dL (ref 0–40)

## 2014-02-19 LAB — BASIC METABOLIC PANEL
ANION GAP: 11 (ref 5–15)
BUN: 24 mg/dL — ABNORMAL HIGH (ref 6–23)
CHLORIDE: 105 meq/L (ref 96–112)
CO2: 24 meq/L (ref 19–32)
Calcium: 8.9 mg/dL (ref 8.4–10.5)
Creatinine, Ser: 0.99 mg/dL (ref 0.50–1.10)
GFR calc Af Amer: 61 mL/min — ABNORMAL LOW (ref >90)
GFR calc non Af Amer: 53 mL/min — ABNORMAL LOW (ref >90)
Glucose, Bld: 111 mg/dL — ABNORMAL HIGH (ref 70–99)
Potassium: 4 mEq/L (ref 3.7–5.3)
Sodium: 140 mEq/L (ref 137–147)

## 2014-02-19 LAB — TROPONIN I: Troponin I: >20 ng/mL (ref <0.30)

## 2014-02-19 MED ORDER — FENOFIBRATE 160 MG PO TABS
160.0000 mg | ORAL_TABLET | Freq: Every day | ORAL | Status: DC
  Administered 2014-02-19 – 2014-02-20 (×2): 160 mg via ORAL
  Filled 2014-02-19 (×3): qty 1

## 2014-02-19 NOTE — Progress Notes (Signed)
Notified Dr. Debara Pickett regarding patient c/o difficulty breathing.  No change in lung assessment and no c/o pain.  Xanax 0.25mg  po given see MAR.  No new orders given.  Will continue to monitor for changes.

## 2014-02-19 NOTE — Progress Notes (Signed)
DAILY PROGRESS NOTE  Subjective:  Radial cath site and arm hematoma noted. Some discomfort in this area. Troponin >20 x 3. Triglycerides are markedly elevated at 521, therefore LDL was not calculated. H/H down to 10.8/33.9 from 11.8/37.  Echo yesterday showed minimal pericardial effusion along the inferior wall.  Objective:  Temp:  [97.2 F (36.2 C)-98.2 F (36.8 C)] 97.2 F (36.2 C) (08/05 0751) Pulse Rate:  [65-99] 99 (08/05 0751) Resp:  [10-26] 15 (08/05 0751) BP: (89-198)/(42-91) 198/67 mmHg (08/05 0751) SpO2:  [97 %-100 %] 100 % (08/05 0751) Weight change:   Intake/Output from previous day: 08/04 0701 - 08/05 0700 In: 1027.3 [P.O.:580; I.V.:447.3] Out: 2775 [Urine:2775]  Intake/Output from this shift:    Medications: Current Facility-Administered Medications  Medication Dose Route Frequency Provider Last Rate Last Dose  . acetaminophen (TYLENOL) tablet 650 mg  650 mg Oral Q4H PRN Jettie Booze, MD      . albuterol (PROVENTIL) (2.5 MG/3ML) 0.083% nebulizer solution 2.5 mg  2.5 mg Nebulization Q6H PRN Jettie Booze, MD      . ALPRAZolam Duanne Moron) tablet 0.25 mg  0.25 mg Oral BID PRN Jettie Booze, MD   0.25 mg at 02/18/14 2030  . aspirin chewable tablet 81 mg  81 mg Oral Daily Jettie Booze, MD      . aspirin EC tablet 81 mg  81 mg Oral Daily Leonie Man, MD   81 mg at 02/18/14 1326  . atorvastatin (LIPITOR) tablet 80 mg  80 mg Oral q1800 Jettie Booze, MD   80 mg at 02/18/14 1751  . enalapril (VASOTEC) tablet 20 mg  20 mg Oral BID Jettie Booze, MD   20 mg at 02/18/14 2134  . fenofibrate tablet 54 mg  54 mg Oral Daily Jettie Booze, MD   54 mg at 02/18/14 1325  . fluticasone (FLONASE) 50 MCG/ACT nasal spray 1 spray  1 spray Each Nare Daily Jettie Booze, MD   1 spray at 02/18/14 1325  . glimepiride (AMARYL) tablet 2 mg  2 mg Oral BID WC Jettie Booze, MD   2 mg at 02/18/14 1751  . metoprolol (LOPRESSOR) tablet 100  mg  100 mg Oral Daily Jettie Booze, MD   100 mg at 02/18/14 1324   And  . hydrochlorothiazide (HYDRODIURIL) tablet 25 mg  25 mg Oral Daily Jettie Booze, MD   25 mg at 02/18/14 1324  . HYDROmorphone (DILAUDID) injection 1-2 mg  1-2 mg Intravenous Q1H PRN Jettie Booze, MD   2 mg at 02/18/14 2135  . insulin aspart (novoLOG) injection 0-15 Units  0-15 Units Subcutaneous TID WC Rhonda G Barrett, PA-C   3 Units at 02/18/14 1752  . insulin aspart (novoLOG) injection 0-5 Units  0-5 Units Subcutaneous QHS Rhonda G Barrett, PA-C      . insulin glargine (LANTUS) injection 15 Units  15 Units Subcutaneous Daily Leonie Man, MD   15 Units at 02/18/14 1324  . mometasone-formoterol (DULERA) 100-5 MCG/ACT inhaler 2 puff  2 puff Inhalation BID Jettie Booze, MD   2 puff at 02/18/14 2009  . nitroGLYCERIN (NITROSTAT) SL tablet 0.4 mg  0.4 mg Sublingual Q5 min PRN Delora Fuel, MD   0.4 mg at 02/18/14 0531  . ondansetron (ZOFRAN) injection 4 mg  4 mg Intravenous Once Delora Fuel, MD      . ondansetron [facility]) injection 4 mg  4 mg Intravenous Q6H PRN Charlann Lange  Irish Lack, MD      . pantoprazole (PROTONIX) EC tablet 40 mg  40 mg Oral Daily Jettie Booze, MD   40 mg at 02/18/14 1325  . potassium chloride SA (K-DUR,KLOR-CON) CR tablet 20 mEq  20 mEq Oral Daily Jettie Booze, MD   20 mEq at 02/18/14 1326  . predniSONE (DELTASONE) tablet 20 mg  20 mg Oral BID WC Jettie Booze, MD      . sodium chloride (OCEAN) 0.65 % nasal spray 1 spray  1 spray Each Nare PRN Jettie Booze, MD      . ticagrelor [facility]) tablet 90 mg  90 mg Oral BID Jettie Booze, MD   90 mg at 02/18/14 2134  . tiotropium (SPIRIVA) inhalation capsule 18 mcg  18 mcg Inhalation Daily Jettie Booze, MD      . traMADol Veatrice Bourbon) tablet 50 mg  50 mg Oral Q6H PRN Jettie Booze, MD   50 mg at 02/18/14 1225  . vitamin E capsule 400 Units  400 Units Oral Daily Jettie Booze, MD   400 Units  at 02/18/14 1325  . zolpidem (AMBIEN) tablet 5 mg  5 mg Oral QHS PRN Lonn Georgia, PA-C   5 mg at 02/18/14 2134    Physical Exam: General appearance: alert and no distress Neck: no carotid bruit and no JVD Lungs: clear to auscultation bilaterally Heart: regular rate and rhythm, S1, S2 normal, no murmur, click, rub or gallop Abdomen: soft, non-tender; bowel sounds normal; no masses,  no organomegaly and obes3e Extremities: right arm is edematous (wrapped), there is ecchymosis over the radial cath site, intact pulse, good CR, no bruit Pulses: 2+ and symmetric Skin: Skin color, texture, turgor normal. No rashes or lesions Neurologic: Grossly normal Psych: Normal  Lab Results: Results for orders placed during the hospital encounter of 02/18/14 (from the past 48 hour(s))  BASIC METABOLIC PANEL     Status: Abnormal   Collection Time    02/18/14  5:06 AM      Result Value Ref Range   Sodium 138  137 - 147 mEq/L   Potassium 3.5 (*) 3.7 - 5.3 mEq/L   Chloride 101  96 - 112 mEq/L   CO2 23  19 - 32 mEq/L   Glucose, Bld 95  70 - 99 mg/dL   BUN 40 (*) 6 - 23 mg/dL   Creatinine, Ser 1.25 (*) 0.50 - 1.10 mg/dL   Calcium 9.1  8.4 - 10.5 mg/dL   GFR calc non Af Amer 40 (*) >90 mL/min   GFR calc Af Amer 46 (*) >90 mL/min   Comment: (NOTE)     The eGFR has been calculated using the CKD EPI equation.     This calculation has not been validated in all clinical situations.     eGFR's persistently <90 mL/min signify possible Chronic Kidney     Disease.   Anion gap 14  5 - 15  PRO B NATRIURETIC PEPTIDE     Status: None   Collection Time    02/18/14  5:06 AM      Result Value Ref Range   Pro B Natriuretic peptide (BNP) 109.0  0 - 450 pg/mL  CBC WITH DIFFERENTIAL     Status: Abnormal   Collection Time    02/18/14  5:06 AM      Result Value Ref Range   WBC 10.1  4.0 - 10.5 K/uL   RBC 4.62  3.87 - 5.11 MIL/uL  Hemoglobin 11.8 (*) 12.0 - 15.0 g/dL   HCT 37.0  36.0 - 46.0 %   MCV 80.1  78.0  - 100.0 fL   MCH 25.5 (*) 26.0 - 34.0 pg   MCHC 31.9  30.0 - 36.0 g/dL   RDW 14.5  11.5 - 15.5 %   Platelets 285  150 - 400 K/uL   Neutrophils Relative % 36 (*) 43 - 77 %   Neutro Abs 3.6  1.7 - 7.7 K/uL   Lymphocytes Relative 51 (*) 12 - 46 %   Lymphs Abs 5.2 (*) 0.7 - 4.0 K/uL   Monocytes Relative 11  3 - 12 %   Monocytes Absolute 1.1 (*) 0.1 - 1.0 K/uL   Eosinophils Relative 2  0 - 5 %   Eosinophils Absolute 0.2  0.0 - 0.7 K/uL   Basophils Relative 0  0 - 1 %   Basophils Absolute 0.0  0.0 - 0.1 K/uL  HEMOGLOBIN A1C     Status: Abnormal   Collection Time    02/18/14  5:06 AM      Result Value Ref Range   Hemoglobin A1C 6.6 (*) <5.7 %   Comment: (NOTE)                                                                               According to the ADA Clinical Practice Recommendations for 2011, when     HbA1c is used as a screening test:      >=6.5%   Diagnostic of Diabetes Mellitus               (if abnormal result is confirmed)     5.7-6.4%   Increased risk of developing Diabetes Mellitus     References:Diagnosis and Classification of Diabetes Mellitus,Diabetes     TXHF,4142,39(RVUYE 1):S62-S69 and Standards of Medical Care in             Diabetes - 2011,Diabetes Care,2011,34 (Suppl 1):S11-S61.   Mean Plasma Glucose 143 (*) <117 mg/dL   Comment: Performed at [facility], ED     Status: None   Collection Time    02/18/14  5:15 AM      Result Value Ref Range   Troponin i, poc 0.00  0.00 - 0.08 ng/mL   Comment 3            Comment: Due to the release kinetics of cTnI,     a negative result within the first hours     of the onset of symptoms does not rule out     myocardial infarction with certainty.     If myocardial infarction is still suspected,     repeat the test at appropriate intervals.  POCT ACTIVATED CLOTTING TIME     Status: None   Collection Time    02/18/14  8:19 AM      Result Value Ref Range   Activated Clotting Time 163    POCT  ACTIVATED CLOTTING TIME     Status: None   Collection Time    02/18/14  8:31 AM      Result Value Ref Range   Activated Clotting Time 225  POCT ACTIVATED CLOTTING TIME     Status: None   Collection Time    02/18/14  9:00 AM      Result Value Ref Range   Activated Clotting Time 286    POCT ACTIVATED CLOTTING TIME     Status: None   Collection Time    02/18/14 10:01 AM      Result Value Ref Range   Activated Clotting Time 275    GLUCOSE, CAPILLARY     Status: None   Collection Time    02/18/14 10:44 AM      Result Value Ref Range   Glucose-Capillary 92  70 - 99 mg/dL  GLUCOSE, CAPILLARY     Status: Abnormal   Collection Time    02/18/14 11:46 AM      Result Value Ref Range   Glucose-Capillary 125 (*) 70 - 99 mg/dL  MRSA PCR SCREENING     Status: None   Collection Time    02/18/14 12:14 PM      Result Value Ref Range   MRSA by PCR NEGATIVE  NEGATIVE   Comment:            The GeneXpert MRSA Assay (FDA     approved for NASAL specimens     only), is one component of a     comprehensive MRSA colonization     surveillance program. It is not     intended to diagnose MRSA     infection nor to guide or     monitor treatment for     MRSA infections.  TROPONIN I     Status: Abnormal   Collection Time    02/18/14  2:49 PM      Result Value Ref Range   Troponin I >20.00 (*) <0.30 ng/mL   Comment:            Due to the release kinetics of cTnI,     a negative result within the first hours     of the onset of symptoms does not rule out     myocardial infarction with certainty.     If myocardial infarction is still suspected,     repeat the test at appropriate intervals.     CRITICAL RESULT CALLED TO, READ BACK BY AND VERIFIED WITH:     E SMITH,RN 1542 02/18/14 D BRADLEY  PRO B NATRIURETIC PEPTIDE     Status: None   Collection Time    02/18/14  2:49 PM      Result Value Ref Range   Pro B Natriuretic peptide (BNP) 405.6  0 - 450 pg/mL  GLUCOSE, CAPILLARY     Status: Abnormal     Collection Time    02/18/14  4:25 PM      Result Value Ref Range   Glucose-Capillary 161 (*) 70 - 99 mg/dL  TROPONIN I     Status: Abnormal   Collection Time    02/18/14  8:00 PM      Result Value Ref Range   Troponin I >20.00 (*) <0.30 ng/mL   Comment:            Due to the release kinetics of cTnI,     a negative result within the first hours     of the onset of symptoms does not rule out     myocardial infarction with certainty.     If myocardial infarction is still suspected,     repeat the test at appropriate intervals.  CRITICAL VALUE NOTED.  VALUE IS CONSISTENT WITH PREVIOUSLY REPORTED AND CALLED VALUE.  GLUCOSE, CAPILLARY     Status: Abnormal   Collection Time    02/18/14  9:22 PM      Result Value Ref Range   Glucose-Capillary 123 (*) 70 - 99 mg/dL  TROPONIN I     Status: Abnormal   Collection Time    02/19/14  2:28 AM      Result Value Ref Range   Troponin I >20.00 (*) <0.30 ng/mL   Comment:            Due to the release kinetics of cTnI,     a negative result within the first hours     of the onset of symptoms does not rule out     myocardial infarction with certainty.     If myocardial infarction is still suspected,     repeat the test at appropriate intervals.     CRITICAL VALUE NOTED.  VALUE IS CONSISTENT WITH PREVIOUSLY REPORTED AND CALLED VALUE.  BASIC METABOLIC PANEL     Status: Abnormal   Collection Time    02/19/14  2:28 AM      Result Value Ref Range   Sodium 140  137 - 147 mEq/L   Potassium 4.0  3.7 - 5.3 mEq/L   Chloride 105  96 - 112 mEq/L   CO2 24  19 - 32 mEq/L   Glucose, Bld 111 (*) 70 - 99 mg/dL   BUN 24 (*) 6 - 23 mg/dL   Comment: DELTA CHECK NOTED   Creatinine, Ser 0.99  0.50 - 1.10 mg/dL   Calcium 8.9  8.4 - 10.5 mg/dL   GFR calc non Af Amer 53 (*) >90 mL/min   GFR calc Af Amer 61 (*) >90 mL/min   Comment: (NOTE)     The eGFR has been calculated using the CKD EPI equation.     This calculation has not been validated in all  clinical situations.     eGFR's persistently <90 mL/min signify possible Chronic Kidney     Disease.   Anion gap 11  5 - 15  CBC     Status: Abnormal   Collection Time    02/19/14  2:28 AM      Result Value Ref Range   WBC 8.8  4.0 - 10.5 K/uL   RBC 4.19  3.87 - 5.11 MIL/uL   Hemoglobin 10.8 (*) 12.0 - 15.0 g/dL   HCT 33.9 (*) 36.0 - 46.0 %   MCV 80.9  78.0 - 100.0 fL   MCH 25.8 (*) 26.0 - 34.0 pg   MCHC 31.9  30.0 - 36.0 g/dL   RDW 14.7  11.5 - 15.5 %   Platelets 277  150 - 400 K/uL  HEPATIC FUNCTION PANEL     Status: Abnormal   Collection Time    02/19/14  2:28 AM      Result Value Ref Range   Total Protein 5.9 (*) 6.0 - 8.3 g/dL   Albumin 3.0 (*) 3.5 - 5.2 g/dL   AST 113 (*) 0 - 37 U/L   ALT 40 (*) 0 - 35 U/L   Alkaline Phosphatase 69  39 - 117 U/L   Total Bilirubin 0.3  0.3 - 1.2 mg/dL   Bilirubin, Direct <0.2  0.0 - 0.3 mg/dL   Indirect Bilirubin NOT CALCULATED  0.3 - 0.9 mg/dL  LIPID PANEL     Status: Abnormal   Collection Time  02/19/14  2:28 AM      Result Value Ref Range   Cholesterol 147  0 - 200 mg/dL   Triglycerides 521 (*) <150 mg/dL   HDL 43  >39 mg/dL   Total CHOL/HDL Ratio 3.4     VLDL UNABLE TO CALCULATE IF TRIGLYCERIDE OVER 400 mg/dL  0 - 40 mg/dL   LDL Cholesterol UNABLE TO CALCULATE IF TRIGLYCERIDE OVER 400 mg/dL  0 - 99 mg/dL   Comment:            Total Cholesterol/HDL:CHD Risk     Coronary Heart Disease Risk Table                         Men   Women      1/2 Average Risk   3.4   3.3      Average Risk       5.0   4.4      2 X Average Risk   9.6   7.1      3 X Average Risk  23.4   11.0                Use the calculated Patient Ratio     above and the CHD Risk Table     to determine the patient's CHD Risk.                ATP III CLASSIFICATION (LDL):      <100     mg/dL   Optimal      100-129  mg/dL   Near or Above                        Optimal      130-159  mg/dL   Borderline      160-189  mg/dL   High      >190     mg/dL   Very High     Imaging: Dg Chest Port 1 View  02/18/2014   CLINICAL DATA:  Chest pain.  EXAM: PORTABLE CHEST - 1 VIEW  COMPARISON:  02/09/2014  FINDINGS: Normal heart size and mediastinal contours. Mild atelectasis in the left mid chest. No consolidation or edema. No effusion or pneumothorax. No acute osseous findings.  IMPRESSION: 1.  No acute cardiopulmonary disease. 2. Minimal atelectasis on the left.   Electronically Signed   By: Jorje Guild M.D.   On: 02/18/2014 05:32    Assessment:  1. Principal Problem: 2.   Acute coronary syndrome 3. Active Problems: 4.   Diabetes mellitus type 2, insulin dependent 5.   Hyperlipidemia with target LDL less than 70 6.   Essential hypertension 7.   Atherosclerotic heart disease of native coronary artery with unstable angina pectoris 8.   Unstable angina 9.   Acute myocardial infarction of other inferior wall, initial episode of care 10.   Plan:  1.   Michelle [name] is feeling better today. Unfortunately, she has ecchymosis and swelling over the right arm - she had a tight ACE wrap on which we removed and elevated her arm. She has no pain. Chest pain has resolved. She can't believe she had an MI.  Her triglycerides are markedly elevated (fasting), however, this seems independent of her diabetes as A1c is fairly well controlled with insulin at 6.6. Would check limited echo today to r/o expansion of pericardial effusion with covered coronary aneurysm. Increase fenofibrate to 160 mg daily - continue  lipitor 80 mg. On aspirin and Brillinta. Ambulate with cardiac rehab. Ok to transfer to stepdown status today.   Time Spent Directly with Patient:  15 minutes  Length of Stay:  LOS: 1 day   Pixie Casino, MD, [facility] Attending Cardiologist [facility]  , C 02/19/2014, 8:47 AM

## 2014-02-19 NOTE — Progress Notes (Signed)
*  PRELIMINARY RESULTS* Echocardiogram 2D Echocardiogram has been performed.  Leavy Cella 02/19/2014, 3:14 PM

## 2014-02-19 NOTE — Progress Notes (Addendum)
CARDIAC REHAB PHASE I   PRE:  Rate/Rhythm: 92 SR  BP:  Supine: 142/72  Sitting:   Standing:    SaO2: 100 2L 100 RA  MODE:  Ambulation: 64 ft   POST:  Rate/Rhythm: 124 ST  BP:  Supine:   Sitting: 123/54  Standing:    SaO2: 100 RA 0945-1040 On arrival pt in bed. Assisted X 1 and used walker to ambulate.Pt is weak and very DOE. She co of being tired and exhausted by end of walk.Room air sat at end of walk 100% and HR 124 ST. Pt dyspnea and tiredness was relieved with rest. Placed pt in recliner with call light in reach and right arm elevated on pillow. Pt c/o of having bad knees and has trouble trying to get to standing position. Pt admits to just walking in the house and uses rollator to walk with. She has a cane, RW and rollator. Pt lives with her son and he buys the groceries. She said that she does not go outside of the house much. Pt does cook, but she has her son to get things out of the oven for her. Started MI and stent education with her. I gave her MI and stent booklets. We discussed Brilinta, risk factors and I left her diabetic and heart healthy diet sheets. Would recommend Physical Therapy consult to help with strengthening and to evaluate discharge needs.  Rodney Langton RN 02/19/2014 10:30 AM

## 2014-02-20 DIAGNOSIS — J449 Chronic obstructive pulmonary disease, unspecified: Secondary | ICD-10-CM

## 2014-02-20 LAB — BASIC METABOLIC PANEL
ANION GAP: 14 (ref 5–15)
BUN: 19 mg/dL (ref 6–23)
CO2: 21 meq/L (ref 19–32)
CREATININE: 0.83 mg/dL (ref 0.50–1.10)
Calcium: 9.6 mg/dL (ref 8.4–10.5)
Chloride: 107 mEq/L (ref 96–112)
GFR calc Af Amer: 76 mL/min — ABNORMAL LOW (ref >90)
GFR calc non Af Amer: 65 mL/min — ABNORMAL LOW (ref >90)
Glucose, Bld: 185 mg/dL — ABNORMAL HIGH (ref 70–99)
POTASSIUM: 4.1 meq/L (ref 3.7–5.3)
Sodium: 142 mEq/L (ref 137–147)

## 2014-02-20 LAB — GLUCOSE, CAPILLARY
GLUCOSE-CAPILLARY: 130 mg/dL — AB (ref 70–99)
GLUCOSE-CAPILLARY: 219 mg/dL — AB (ref 70–99)
GLUCOSE-CAPILLARY: 155 mg/dL — AB (ref 70–99)

## 2014-02-20 NOTE — Progress Notes (Signed)
CARDIAC REHAB PHASE I   PRE:  Rate/Rhythm: 83 SR  BP:  Supine:   Sitting: 110/70  Standing:    SaO2: 97-99%RA  MODE:  Ambulation: 80 ft   POST:  Rate/Rhythm: 89   BP:  Supine:   Sitting: 124/70  Standing:    SaO2: 93%RA 1420-1500 Pt waiting on breathing treatment but stated she felt she could walk. Walked 5 ft with rollator and asst x 1 sitting at 40 ft. Very DOE but sats good. Encouraged pt to purse- lip breathe. Pt stated she mainly sits at home. Does not walk much. Declined CRP 2 because no transportation. Discussed NTG use. Re enforced brilinta use.  Did some diet ed but pt feeling SOB after walk. Stated she feels more SOB today.  Encouraged pt to read the heart healthy and diabetic diets at home. Hit main points with pt.   Graylon Good, RN BSN  02/20/2014 2:57 PM

## 2014-02-20 NOTE — Evaluation (Signed)
Physical Therapy Evaluation Patient Details Name: Michelle [name] MRN: [id number] DOB: 1933-05-31 Today's Date: 02/20/2014   History of Present Illness  Pt is a 80 y/o female admitted s/p STEMI and is now s/p L heart cath with coronary angiogram.  Clinical Impression  Pt admitted with the above. Pt currently with functional limitations due to the deficits listed below (see PT Problem List). At the time of PT eval pt was able to perform transfers and ambulation with min guard. Tolerance for functional activity is low and feel pt would benefit from HHPT at d/c. Pt will benefit from skilled PT to increase their independence and safety with mobility to allow discharge to the venue listed below.       Follow Up Recommendations Home health PT;Supervision for mobility/OOB    Equipment Recommendations  None recommended by PT    Recommendations for Other Services       Precautions / Restrictions Precautions Precautions: Fall Restrictions Weight Bearing Restrictions: No      Mobility  Bed Mobility Overal bed mobility: Needs Assistance Bed Mobility: Supine to Sit     Supine to sit: Min guard     General bed mobility comments: Pt able to transition to EOB with guarding for safety during trunk elevation.   Transfers Overall transfer level: Needs assistance Equipment used: Rolling walker (2 wheeled) Transfers: Sit to/from Stand Sit to Stand: Min guard         General transfer comment: Guarding for safety. Increased time to power-up to full standing position.   Ambulation/Gait Ambulation/Gait assistance: Min guard Ambulation Distance (Feet): 100 Feet Assistive device: Rolling walker (2 wheeled) Gait Pattern/deviations: Step-through pattern;Decreased stride length;Trunk flexed Gait velocity: Decreased Gait velocity interpretation: Below normal speed for age/gender General Gait Details: VC's for safety awareness. Pt fatigues quickly and asks to return to the room after  50'.  Stairs            Wheelchair Mobility    Modified Rankin (Stroke Patients Only)       Balance Overall balance assessment: Needs assistance Sitting-balance support: Feet supported;No upper extremity supported Sitting balance-Leahy Scale: Fair     Standing balance support: Bilateral upper extremity supported;During functional activity Standing balance-Leahy Scale: Fair Standing balance comment: Feel pt could stand for short amount of time without UE support, however for any dynamic movement, needs UE support.                             Pertinent Vitals/Pain      Home Living Family/patient expects to be discharged to:: Private residence Living Arrangements: Children Available Help at Discharge: Family;Available PRN/intermittently Type of Home: House Home Access: Stairs to enter Entrance Stairs-Rails: Right Entrance Stairs-Number of Steps: 4 Home Layout: One level Home Equipment: Walker - 4 wheels;Walker - standard      Prior Function Level of Independence: Independent with assistive device(s)               Hand Dominance   Dominant Hand: Right    Extremity/Trunk Assessment   Upper Extremity Assessment: Defer to OT evaluation           Lower Extremity Assessment: Generalized weakness      Cervical / Trunk Assessment: Normal  Communication   Communication: No difficulties  Cognition Arousal/Alertness: Awake/alert Behavior During Therapy: WFL for tasks assessed/performed Overall Cognitive Status: Within Functional Limits for tasks assessed  General Comments      Exercises        Assessment/Plan    PT Assessment Patient needs continued PT services  PT Diagnosis Difficulty walking;Acute pain   PT Problem List Decreased strength;Decreased range of motion;Decreased activity tolerance;Decreased balance;Decreased mobility;Decreased knowledge of use of DME;Decreased safety awareness;Decreased  knowledge of precautions  PT Treatment Interventions DME instruction;Gait training;Stair training;Functional mobility training;Therapeutic activities;Therapeutic exercise;Neuromuscular re-education;Patient/family education   PT Goals (Current goals can be found in the Care Plan section) Acute Rehab PT Goals Patient Stated Goal: To return home PT Goal Formulation: With patient Time For Goal Achievement: 02/27/14 Potential to Achieve Goals: Good    Frequency Min 3X/week   Barriers to discharge Decreased caregiver support Son works during the day    Co-evaluation               End of Session Equipment Utilized During Treatment: Gait belt Activity Tolerance: Patient limited by fatigue Patient left: in chair;with call bell/phone within reach Nurse Communication: Mobility status         Time: 0916-0940 PT Time Calculation (min): 24 min   Charges:   PT Evaluation $Initial PT Evaluation Tier I: 1 Procedure PT Treatments $Therapeutic Activity: 8-22 mins   PT G CodesJolyn Lent 02/20/2014, 1:46 PM  Jolyn Lent, PT, DPT Acute Rehabilitation Services Pager: [phone]

## 2014-02-20 NOTE — Progress Notes (Signed)
TELEMETRY: Reviewed telemetry pt in NSR: Filed Vitals:   02/19/14 2000 02/19/14 2130 02/20/14 0000 02/20/14 0500  BP: 156/62 132/53 107/53 149/77  Pulse: 91  92 89  Temp: 98.4 F (36.9 C)  98.3 F (36.8 C) 97.7 F (36.5 C)  TempSrc: Oral  Oral Oral  Resp: 21  20 23   Height:      Weight:      SpO2: 100%  99% 97%    Intake/Output Summary (Last 24 hours) at 02/20/14 0737 Last data filed at 02/20/14 0500  Gross per 24 hour  Intake    575 ml  Output   3800 ml  Net  -3225 ml   Filed Weights   02/18/14 0440  Weight: 175 lb (79.379 kg)    Subjective Feels pretty well today. Denies any chest pain or SOB.   Marland Kitchen aspirin EC  81 mg Oral Daily  . atorvastatin  80 mg Oral q1800  . enalapril  20 mg Oral BID  . fenofibrate  160 mg Oral Daily  . fluticasone  1 spray Each Nare Daily  . glimepiride  2 mg Oral BID WC  . metoprolol tartrate  100 mg Oral Daily   And  . hydrochlorothiazide  25 mg Oral Daily  . insulin aspart  0-15 Units Subcutaneous TID WC  . insulin aspart  0-5 Units Subcutaneous QHS  . insulin glargine  15 Units Subcutaneous Daily  . mometasone-formoterol  2 puff Inhalation BID  . ondansetron (ZOFRAN) IV  4 mg Intravenous Once  . pantoprazole  40 mg Oral Daily  . potassium chloride SA  20 mEq Oral Daily  . predniSONE  20 mg Oral BID WC  . ticagrelor  90 mg Oral BID  . tiotropium  18 mcg Inhalation Daily  . vitamin E  400 Units Oral Daily      LABS: Basic Metabolic Panel:  Recent Labs  02/19/14 0228 02/20/14 0336  NA 140 142  K 4.0 4.1  CL 105 107  CO2 24 21  GLUCOSE 111* 185*  BUN 24* 19  CREATININE 0.99 0.83  CALCIUM 8.9 9.6   Liver Function Tests:  Recent Labs  02/19/14 0228  AST 113*  ALT 40*  ALKPHOS 69  BILITOT 0.3  PROT 5.9*  ALBUMIN 3.0*   No results found for this basename: LIPASE, AMYLASE,  in the last 72 hours CBC:  Recent Labs  02/18/14 0506 02/19/14 0228  WBC 10.1 8.8  NEUTROABS 3.6  --   HGB 11.8* 10.8*  HCT 37.0  33.9*  MCV 80.1 80.9  PLT 285 277   Cardiac Enzymes:  Recent Labs  02/18/14 1449 02/18/14 2000 02/19/14 0228  TROPONINI >20.00* >20.00* >20.00*   BNP:  Recent Labs  02/18/14 0506 02/18/14 1449  PROBNP 109.0 405.6   D-Dimer: No results found for this basename: DDIMER,  in the last 72 hours Hemoglobin A1C:  Recent Labs  02/18/14 0506  HGBA1C 6.6*   Fasting Lipid Panel:  Recent Labs  02/19/14 0228  CHOL 147  HDL 43  LDLCALC UNABLE TO CALCULATE IF TRIGLYCERIDE OVER 400 mg/dL  TRIG 521*  CHOLHDL 3.4   Thyroid Function Tests: No results found for this basename: TSH, T4TOTAL, FREET3, T3FREE, THYROIDAB,  in the last 72 hours   Radiology/Studies:  PORTABLE CHEST - 1 VIEW  COMPARISON: 02/09/2014  FINDINGS:  Normal heart size and mediastinal contours. Mild atelectasis in the  left mid chest. No consolidation or edema. No effusion or  pneumothorax. No acute  osseous findings.  IMPRESSION:  1. No acute cardiopulmonary disease.  2. Minimal atelectasis on the left.  Electronically Signed  By: Jorje Guild M.D.  On: 02/18/2014 05:32  Ecg: NSR with first degree AV block. Inferior MI recent. LAD.  Echo:Study Conclusions  - Left ventricle: The cavity size was normal. Wall thickness was increased in a pattern of mild LVH. Systolic function was normal. The estimated ejection fraction was in the range of 60% to 65%. Doppler parameters are consistent with abnormal left ventricular relaxation (grade 1 diastolic dysfunction). - Mitral valve: There was trivial regurgitation. - Right ventricle: The cavity size was mildly dilated. Systolic function was normal. - Pulmonary arteries: No complete TR doppler jet so unable to estimate PA systolic pressure. - Inferior vena cava: The vessel was normal in size. The respirophasic diameter changes were in the normal range (>= 50%), consistent with normal central venous pressure. - Pericardium, extracardiac: A trivial  pericardial effusion was identified posterior to the heart.  Impressions:  - Normal LV size with mild LV hypertrophy. EF 60-65%. Mildly dilated RV with normal systolic function. No significant valvular abnormalities. There was a trivial posterior pericardial effusion.  PROCEDURE: Left heart catheterization with selective coronary angiography, left ventriculogram. PCI RCA; aspiration thrombectomy of the RCA. 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  INDICATIONS: Acute Inferior MI in a patient with ongoing, active chest pain- brought to the Cath Lab emergently for angiography and intervention.  The risks, benefits, and details of the procedure were explained to the patient. The patient verbalized understanding and wanted to proceed. Emergency, implied consent was obtained.  PROCEDURE TECHNIQUE: After Xylocaine anesthesia a 69F slender sheath was placed in the right radial artery with a single anterior needle wall stick. Right coronary angiography was done using a Judkins R4 guide catheter. Left coronary angiography was done using a Judkins L3.5 guide catheter. Left ventriculography was done using a pigtail catheter. The intervention was performed. Please see below for details. A TR band was used for hemostasis.  CONTRAST: Total of 160 cc.  COMPLICATIONS: None.  HEMODYNAMICS: Aortic pressure was 136/68; LV pressure was 139/10; LVEDP 21. There was no gradient between the left ventricle and aorta.  ANGIOGRAPHIC DATA: The left main coronary artery is widely patent.  The left anterior descending artery is a large vessel which wraps around the apex. There is mild disease in the mid vessel after the origin of a large diagonal area. The distal RCA system fills by left to right collaterals. There is a large branching diagonal vessel which is widely patent.  The left circumflex artery is a large vessel. There is mild, diffuse atherosclerosis. There is a large  second obtuse marginal. The first obtuse marginal is small but patent.  The right coronary artery is occluded in the midportion. After revascularization, it is noted that there is a very large aneurysm, likely at the prior angioplasty site in the mid RCA, just past the occlusion. There is severe lesions just before and after the aneurysm. There is moderate disease in the remainder of the distal RCA. The posterior lateral artery is very large and patent.  LEFT VENTRICULOGRAM: Left ventricular angiogram was done in the 30 RAO projection and revealed normal left ventricular wall motion and systolic function with an estimated ejection fraction of 55%. LVEDP was 19 mmHg.  PCI NARRATIVE: IV heparin and IV tirofiban were used for anticoagulation. ACT was used to check that the heparin was therapeutic. A JR 4  guiding catheter was used to engage the RCA. A pro-water wire was advanced to the distal RCA with little difficulty. It was noted that there was some coiling of the wire in the mid RCA. It was redirected and then went to the distal vessel. Aspiration thrombectomy was performed. A 2.5 balloon was advanced to the mid RCA but would not cross the area of stenosis. It was deployed in the proximal RCA with the hopes that we could get into the lesion. After deployment, it would not advance. A 2.0 x 12 predilatation balloon was used in a similar fashion as it would not easily cross. At this point, contrast staining was noted in the mid RCA. It appeared to be an aneurysm but to make sure that we had not perforated, a stat echocardiogram was obtained. A 1.2 mm balloon was then advanced and did successfully cross the area of disease in the mid RCA. The balloon was inflated to high pressure to try and make a channel. A 1.5 noncompliant balloon was then successfully advanced and inflated to high pressure. The echocardiogram was done and showed no pericardial effusion. A 2.0 x 12 noncompliant balloon was then advanced and  inflated in the area occlusion in the mid RCA. A 2.5 balloon was then inflated as well in the area of occlusion in the mid RCA. We attempted to deliver a 2.8 x 19 graft master covered stent to the mid RCA to cover the aneurysm. It would not traverse the severe calcific disease. A mailman wire was advanced down into the RCA. Over the 2 wires, the graft master stent did travel down into the mid RCA. The mailman wire was removed.  The Covered stent was deployed to cover the aneurysm. A covered stent was used because there was high-risk for perforation with stenting of the junction of the aneurysm and more normal vessel, due to heavy calcification and atherosclerotic burden. Small noncompliant balloons were required to make a channel and given the likely weakness of the vessel at the distal portion of the aneurysm, I felt a graft master stent was safe this. Given her large area of ongoing ischemia, active chest pain, if a perforation had occurred at that point, she likely would not have survived.  This was the longest stent but it was not long enough to cover the atherosclerotic disease in the more normal portions of the RCA which are both distal as well as proximal to the aneurysm. The mailman wire was placed back in the vessel. The covered stent was post dilated with a 3.0 noncompliant balloon. A 2.75 x 20 promus drug-eluting stent was advanced to the distal edge of the graft master stent. It was deployed in overlapping fashion. A 3.0 x 8 Promus drug-eluting stent was then deployed overlapping the proximal edge of the covered stent. The entire stented segment was post dilated with a 3.25 noncompliant balloon. There was an excellent angiographic result. The patient was restless during the procedure and required a fair amount of sedation. Overall, she tolerated the procedure well.  IMPRESSIONS:  1. Normal left main coronary artery. 2. Mild disease in the mid left anterior descending artery and its  branches. 3. Mild disease in the left circumflex artery and its branches. 4. 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 Treated with a 2.8 x 19 mm Covered stent. Distal to stent, a 2.75 x 20 mm Promus was deployed. Proximal to the covered stent, a 3.0 x 8 mm stent was deployed. Entire stented  area dilated to 3.3 mm in diameter.  5. Normal left ventricular systolic function. LVEDP 19 mmHg. Ejection fraction 55%. RECOMMENDATION: Continue dual antiplatelet therapy indefinitely. She'll be watched in the ICU. Continue aggressive secondary prevention. I would continue dual antiplatelet therapy longer than usual given the covered stent.  A covered stent was used to cover the RCA aneurysm because athersclerostic disease causing MI was adjacent to the aneurysm. If the aneurysm ruptured during the procedure with conventional stent deployment, orout side of the hospital after our manipulation, the patient would have tamponade and possibly death. Therefore, we elected to cover the aneurysm.  F/u with Dr. Angelena Form.    PHYSICAL EXAM General: Well developed, obese, in no acute distress. Head: Normocephalic, atraumatic, sclera non-icteric, oropharynx is clear Neck: Negative for carotid bruits. JVD not elevated. No adenopathy Lungs: Clear bilaterally to auscultation without wheezes, rales, or rhonchi. Breathing is unlabored. Heart: RRR S1 S2 without murmurs, rubs, or gallops.  Abdomen: Soft, non-tender, obese with normoactive bowel sounds. No hepatomegaly. No rebound/guarding. No obvious abdominal masses. Msk:  Strength and tone appears normal for age. Extremities: No clubbing, cyanosis or edema.  Distal pedal pulses are 2+ and equal bilaterally. Patient has extensive bruising right forearm from cath access. This is soft and nontender. Pulses 2+. Neuro: Alert and oriented X 3. Moves all extremities spontaneously. Psych:  Responds to questions appropriately with a normal  affect.  ASSESSMENT AND PLAN: 1. Inferior STEMI. S/p PCI of the RCA with covered stent used to cover RCA aneurysm. Continue DAPT with ASA and Brilinta. Will transfer to telemetry today. Awaiting PT evaluation. On Metoprolol, statin, and ACEi. Anticipate DC tomorrow.   2. DM. Continue glipizide and insulin. On SSI.  3. HTN- continue ACEi, metoprolol, HCTZ  4. Dyslipidemia. On high dose lipitor and fenofibrate.   5. Right forearm hematoma. Resolving.   6. Deconditioning. PT consult today.   Present on Admission:  . Essential hypertension . Hyperlipidemia with target LDL less than 70 . Atherosclerotic heart disease of native coronary artery with unstable angina pectoris . Acute coronary syndrome . Acute myocardial infarction of other inferior wall, initial episode of care  Signed, Ridhima Golberg [place], [place] 02/20/2014 7:37 AM

## 2014-02-21 ENCOUNTER — Encounter (HOSPITAL_COMMUNITY): Payer: Self-pay | Admitting: Cardiology

## 2014-02-21 DIAGNOSIS — I2119 ST elevation (STEMI) myocardial infarction involving other coronary artery of inferior wall: Secondary | ICD-10-CM

## 2014-02-21 DIAGNOSIS — T148XXA Other injury of unspecified body region, initial encounter: Secondary | ICD-10-CM | POA: Diagnosis not present

## 2014-02-21 DIAGNOSIS — I213 ST elevation (STEMI) myocardial infarction of unspecified site: Secondary | ICD-10-CM | POA: Diagnosis present

## 2014-02-21 DIAGNOSIS — Z955 Presence of coronary angioplasty implant and graft: Secondary | ICD-10-CM

## 2014-02-21 LAB — GLUCOSE, CAPILLARY
Glucose-Capillary: 163 mg/dL — ABNORMAL HIGH (ref 70–99)
Glucose-Capillary: 114 mg/dL — ABNORMAL HIGH (ref 70–99)

## 2014-02-21 MED ORDER — ACETAMINOPHEN 325 MG PO TABS: 650.0000 mg | ORAL_TABLET | ORAL | Status: DC | PRN

## 2014-02-21 MED ORDER — TICAGRELOR 90 MG PO TABS: 90.0000 mg | ORAL_TABLET | Freq: Two times a day (BID) | ORAL | Status: DC

## 2014-02-21 MED ORDER — PANTOPRAZOLE SODIUM 40 MG PO TBEC: 40.0000 mg | DELAYED_RELEASE_TABLET | Freq: Every day | ORAL | Status: DC

## 2014-02-21 MED ORDER — HYDROCHLOROTHIAZIDE 25 MG PO TABS
25.0000 mg | ORAL_TABLET | Freq: Every day | ORAL | Status: DC
  Filled 2014-02-21: qty 1

## 2014-02-21 MED ORDER — NITROGLYCERIN 0.4 MG SL SUBL: 0.4000 mg | SUBLINGUAL_TABLET | SUBLINGUAL | Status: DC | PRN

## 2014-02-21 MED ORDER — HYDROCHLOROTHIAZIDE 25 MG PO TABS: 25.0000 mg | ORAL_TABLET | Freq: Every day | ORAL | Status: DC

## 2014-02-21 MED ORDER — METOPROLOL SUCCINATE ER 100 MG PO TB24: 100.0000 mg | ORAL_TABLET | Freq: Every day | ORAL | Status: DC

## 2014-02-21 MED ORDER — PREDNISONE 20 MG PO TABS: 20.0000 mg | ORAL_TABLET | Freq: Two times a day (BID) | ORAL | Status: DC

## 2014-02-21 MED ORDER — METOPROLOL SUCCINATE ER 100 MG PO TB24
100.0000 mg | ORAL_TABLET | Freq: Every day | ORAL | Status: DC
  Filled 2014-02-21: qty 1

## 2014-02-21 NOTE — Progress Notes (Signed)
Physical Therapy Treatment Patient Details Name: Michelle [name] MRN: [id number] DOB: 05/02/1933 Today's Date: 03-22-2014    History of Present Illness Pt is a 80 y/o female admitted s/p STEMI and is now s/p L heart cath with coronary angiogram.    PT Comments    Pt making good progress. Ready for return home with HHPT from PT standpoint.  Follow Up Recommendations  Home health PT;Supervision - Intermittent     Equipment Recommendations  None recommended by PT    Recommendations for Other Services       Precautions / Restrictions Precautions Precautions: Fall Restrictions Weight Bearing Restrictions: No    Mobility  Bed Mobility                  Transfers Overall transfer level: Modified independent Equipment used: 4-wheeled walker Transfers: Sit to/from Stand Sit to Stand: Modified independent (Device/Increase time)            Ambulation/Gait Ambulation/Gait assistance: Modified independent (Device/Increase time) Ambulation Distance (Feet): 150 Feet Assistive device: 4-wheeled walker Gait Pattern/deviations: Step-through pattern;Decreased stride length Gait velocity: Decreased       Stairs            Wheelchair Mobility    Modified Rankin (Stroke Patients Only)       Balance   Sitting-balance support: No upper extremity supported Sitting balance-Leahy Scale: Normal     Standing balance support: No upper extremity supported Standing balance-Leahy Scale: Fair                      Cognition Arousal/Alertness: Awake/alert Behavior During Therapy: WFL for tasks assessed/performed Overall Cognitive Status: Within Functional Limits for tasks assessed                      Exercises      General Comments        Pertinent Vitals/Pain Pain Assessment: No/denies pain    Home Living                      Prior Function            PT Goals (current goals can now be found in the care plan section)  Progress towards PT goals: Progressing toward goals    Frequency  Min 3X/week    PT Plan Current plan remains appropriate    Co-evaluation             End of Session   Activity Tolerance: Patient tolerated treatment well Patient left: in bed;with call bell/phone within reach (sitting EOB)     Time: 7062-3762 PT Time Calculation (min): 8 min  Charges:  $Gait Training: 8-22 mins                    G Codes:      Lynlee Stratton 2014/03/22, 9:52 AM  [facility] PT [phone]

## 2014-02-21 NOTE — Progress Notes (Signed)
Discharged to home with family office visits in place teaching done  

## 2014-02-21 NOTE — Discharge Summary (Signed)
Physician Discharge Summary       Patient ID: Michelle [name] MRN: [id number] DOB/AGE: [birth year] 80 y.o.  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 Treated with a 2.8 x 19 mm Covered stent. Distal to stent, a 2.75 x 20 mm Promus was deployed. Proximal to the covered stent, a 3.0 x 8 mm stent was deployed. Entire stented area dilated to 3.3 mm in diameter.  By Dr. Marinda Elk Course: 80 y.o. female with a past medical history significant for known CAD with prior PTCA of the RCA in 1990 by Dr. Lia Foyer with no further coronary evaluation since. She also has diabetes with insulin listed, essential hypertension and hyperlipidemia. She also has COPD she quit smoking several years ago. She's been noticing some increasing episodes of dyspnea that required ER evaluation most recently on July 26. She has not had any resting or exertional chest pain until morning of admit- when she awoke at roughly 4 in the morning with substernal burning pressure that radiated to her jaw. She did note dyspnea associated with this and had mild nausea. She then came into the ED and received sublingual nitroglycerin that proceeded to cause significant hypotension with blood pressures in the 70s.  Did alleviate her pain to some extent reducing it from 10/10 to roughly 4/10. She continues to have intermittent processes of worsening symptoms. She did state that when she got to walk around that did exacerbate her symptoms. New EKG changes. She was taken to cath lab urgently for eval of possible occluded or subtotally occluded coronary artery.  Additionally continued chest pain and SOB.   Cath revealed right coronary artery is occluded in the midportion. After revascularization, it is noted that there is a very large aneurysm, likely at the prior angioplasty site in the mid RCA, just past the occlusion. There is severe lesions just before and after the aneurysm. There is moderate disease in the remainder of the distal RCA. The posterior lateral artery is very large and patent.  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 Treated with a 2.8 x 19 mm Covered stent. Distal to stent, a 2.75 x 20 mm Promus was deployed. Proximal to the covered stent, a 3.0 x 8 mm stent was deployed. Entire stented area dilated to 3.3 mm in diameter.  EF 55%.  Pt did develop swelling in rt arm after cath - hematoma. Though she continued with good distal blood flow to fingers.   Echo with small pericardial effusion but down to trivial by next day.   She was debilitated after procedure, PT saw her and they felt home health PT would be beneficial.  This was ordered.   Pk troponin > 20, TG elevated and fenofibrate added.  LFTs elevated with MI.  She will need  lipid panel and CMP as outpt.   She also ambulated with cardiac rehab and did well.  Prior to discharge she was seen and evaluated by Dr. Claiborne Billings and felt stable for discharge home.  She will follow up with Dr. Angelena Form.  We stopped her lasix but continued HCTZ.  Also before admit she had been on steroids for bronchitis, these were continued.  At discharge, bronchitis resolved, will wean and d/c steroids.   Consults: None  Significant  Diagnostic Studies:  BMET    Component Value Date/Time   NA 142 02/20/2014 0336   K 4.1 02/20/2014 0336   CL 107 02/20/2014 0336   CO2 21 02/20/2014 0336   GLUCOSE 185* 02/20/2014 0336   GLUCOSE 124* 07/28/2006 1202   BUN 19 02/20/2014 0336   CREATININE 0.83 02/20/2014 0336   CALCIUM 9.6 02/20/2014 0336   GFRNONAA 65* 02/20/2014 0336   GFRAA 76* 02/20/2014 0336    CBC    Component Value Date/Time   WBC 8.8 02/19/2014 0228   RBC 4.19 02/19/2014 0228   HGB 10.8* 02/19/2014 0228   HCT 33.9* 02/19/2014 0228   PLT 277 02/19/2014 0228   MCV 80.9 02/19/2014 0228   MCH 25.8* 02/19/2014 0228   MCHC 31.9 02/19/2014 0228   RDW 14.7 02/19/2014 0228   LYMPHSABS 5.2* 02/18/2014 0506   MONOABS 1.1* 02/18/2014 0506   EOSABS 0.2 02/18/2014 0506   BASOSABS 0.0 02/18/2014 0506    Troponin >20  Pro BNP 405  Lipid Panel     Component Value Date/Time   CHOL 147 02/19/2014 0228   TRIG 521* 02/19/2014 0228   HDL 43 02/19/2014 0228   CHOLHDL 3.4 02/19/2014 0228   VLDL UNABLE TO CALCULATE IF TRIGLYCERIDE OVER 400 mg/dL 02/19/2014 0228   LDLCALC UNABLE TO CALCULATE IF TRIGLYCERIDE OVER 400 mg/dL 02/19/2014 0228    Hgb A1C 6.6  EXAM:  PORTABLE CHEST - 1 VIEW  COMPARISON: 02/09/2014  FINDINGS:  Normal heart size and mediastinal contours. Mild atelectasis in the  left mid chest. No consolidation or edema. No effusion or  pneumothorax. No acute osseous findings.  IMPRESSION:  1. No acute cardiopulmonary disease.  2. Minimal atelectasis on the left.    Echo 02/19/14: Left ventricle: The cavity size was normal. Systolic function was normal. Wall motion was normal; there were no regional wall motion abnormalities.  Impressions:  - This was a limited study for the evaluation of oericardial effusion. There is minimal pericardial effusion aound the inferolateral wall. No signs of hemodynamic compromie.   ECHO 02/20/14: Left ventricle: The cavity size was normal. Wall thickness was increased in a pattern of mild LVH. Systolic function was  normal. The estimated ejection fraction was in the range of 60% to 65%. Doppler parameters are consistent with abnormal left ventricular relaxation (grade 1 diastolic dysfunction). - Mitral valve: There was trivial regurgitation. - Right ventricle: The cavity size was mildly dilated. Systolic function was normal. - Pulmonary arteries: No complete TR doppler jet so unable to estimate PA systolic pressure. - Inferior vena cava: The vessel was normal in size. The respirophasic diameter changes were in the normal range (>= 50%), consistent with normal central venous pressure. - Pericardium, extracardiac: A trivial pericardial effusion was identified posterior to the heart.  Impressions:  - Normal LV size with mild LV hypertrophy. EF 60-65%. Mildly dilated RV with normal systolic function. No significant valvular abnormalities. There was a trivial posterior pericardial effusion.   Cardiac Cath: HEMODYNAMICS: Aortic pressure was 136/68; LV  pressure was 139/10; LVEDP 21. There was no gradient between the left ventricle and aorta.  ANGIOGRAPHIC DATA: The left main coronary artery is widely patent.  The left anterior descending artery is a large vessel which wraps around the apex. There is mild disease in the mid vessel after the origin of a large diagonal area. The distal RCA system fills by left to right collaterals. There is a large branching diagonal vessel which is widely patent.  The left circumflex artery is a large vessel. There is mild, diffuse atherosclerosis. There is a large second obtuse marginal. The first obtuse marginal is small but patent.  The right coronary artery is occluded in the midportion. After revascularization, it is noted that there is a very large aneurysm, likely at the prior angioplasty site in the mid RCA, just past the occlusion. There is severe lesions just before and after the aneurysm. There is moderate disease in the remainder of the distal RCA. The posterior  lateral artery is very large and patent.  LEFT VENTRICULOGRAM: Left ventricular angiogram was done in the 30 RAO projection and revealed normal left ventricular wall motion and systolic function with an estimated ejection fraction of 55%. LVEDP was 19 mmHg.  PCI NARRATIVE: IV heparin and IV tirofiban were used for anticoagulation. ACT was used to check that the heparin was therapeutic. A JR 4 guiding catheter was used to engage the RCA. A pro-water wire was advanced to the distal RCA with little difficulty. It was noted that there was some coiling of the wire in the mid RCA. It was redirected and then went to the distal vessel. Aspiration thrombectomy was performed. A 2.5 balloon was advanced to the mid RCA but would not cross the area of stenosis. It was deployed in the proximal RCA with the hopes that we could get into the lesion. After deployment, it would not advance. A 2.0 x 12 predilatation balloon was used in a similar fashion as it would not easily cross. At this point, contrast staining was noted in the mid RCA. It appeared to be an aneurysm but to make sure that we had not perforated, a stat echocardiogram was obtained. A 1.2 mm balloon was then advanced and did successfully cross the area of disease in the mid RCA. The balloon was inflated to high pressure to try and make a channel. A 1.5 noncompliant balloon was then successfully advanced and inflated to high pressure. The echocardiogram was done and showed no pericardial effusion. A 2.0 x 12 noncompliant balloon was then advanced and inflated in the area occlusion in the mid RCA. A 2.5 balloon was then inflated as well in the area of occlusion in the mid RCA. We attempted to deliver a 2.8 x 19 graft master covered stent to the mid RCA to cover the aneurysm. It would not traverse the severe calcific disease. A mailman wire was advanced down into the RCA. Over the 2 wires, the graft master stent did travel down into the mid RCA. The mailman wire was  removed.  The Covered stent was deployed to cover the aneurysm. A covered stent was used because there was high-risk for perforation with stenting of the junction of the aneurysm and more normal vessel, due to heavy calcification and atherosclerotic burden. Small noncompliant balloons were required to make a channel and given the likely weakness of the vessel at the distal portion of the aneurysm, I felt a graft master stent was safe this. Given her large area of ongoing ischemia, active  chest pain, if a perforation had occurred at that point, she likely would not have survived.  This was the longest stent but it was not long enough to cover the atherosclerotic disease in the more normal portions of the RCA which are both distal as well as proximal to the aneurysm. The mailman wire was placed back in the vessel. The covered stent was post dilated with a 3.0 noncompliant balloon. A 2.75 x 20 promus drug-eluting stent was advanced to the distal edge of the graft master stent. It was deployed in overlapping fashion. A 3.0 x 8 Promus drug-eluting stent was then deployed overlapping the proximal edge of the covered stent. The entire stented segment was post dilated with a 3.25 noncompliant balloon. There was an excellent angiographic result. The patient was restless during the procedure and required a fair amount of sedation. Overall, she tolerated the procedure well.  IMPRESSIONS:  1. Normal left main coronary artery. 2. Mild disease in the mid left anterior descending artery and its branches. 3. Mild disease in the left circumflex artery and its branches. 4. 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 Treated with a 2.8 x 19 mm Covered stent. Distal to stent, a 2.75 x 20 mm Promus was deployed. Proximal to the covered stent, a 3.0 x 8 mm stent was deployed. Entire stented area dilated to 3.3 mm in diameter.  5. Normal left ventricular systolic function. LVEDP 19 mmHg. Ejection  fraction 55%. RECOMMENDATION: Continue dual antiplatelet therapy indefinitely. She'll be watched in the ICU. Continue aggressive secondary prevention. I would continue dual antiplatelet therapy longer than usual given the covered stent.  A covered stent was used to cover the RCA aneurysm because athersclerostic disease causing MI was adjacent to the aneurysm. If the aneurysm ruptured during the procedure with conventional stent deployment, orout side of the hospital after our manipulation, the patient would have tamponade and possibly death. Therefore, we elected to cover the aneurysm.     LAST ekg: Sinus rhythm with 1st degree A-V block Left axis deviation Inferior infarct , age undetermined Abnormal ECG since last tracing no significant change    Discharge Exam: Blood pressure 122/59, pulse 103, temperature 97.4 F (36.3 C), temperature source Oral, resp. rate 18, height 5\' 5"  (1.651 m), weight 170 lb 8 oz (77.338 kg), SpO2 100.00%.  Disposition: 01-Home or Self Care      Discharge Instructions   Face-to-face encounter (required for Medicare/Medicaid patients)    Complete by:  As directed   I [facility] R certify that this patient is under my care and that I, or a nurse practitioner or physician's assistant working with me, had a face-to-face encounter that meets the physician face-to-face encounter requirements with this patient on 02/21/2014. The encounter with the patient was in whole, or in part for the following medical condition(s) which is the primary reason for home health care (List medical condition): deconditioning, STEMI, COPD, CAD with stents to RCA  The encounter with the patient was in whole, or in part, for the following medical condition, which is the primary reason for home health care:  STEMI, CAD, STENTS to RCA, deconditioning  I certify that, based on my findings, the following services are medically necessary home health services:  Physical therapy  My clinical  findings support the need for the above services:   Shortness of breath with activity Unable to leave home safely without assistance and/or assistive device    Further, I certify that my clinical findings  support that this patient is homebound due to:  Unable to leave home safely without assistance  Reason for Medically Necessary Home Health Services:   Therapy- Instruction on Safe use of Assistive Devices for ADLs Therapy- Therapeutic Exercises to Increase Strength and Endurance       Home Health    Complete by:  As directed   To provide the following care/treatments:  PT            Medication List    STOP taking these medications       amLODipine 10 MG tablet  Commonly known as:  NORVASC     azithromycin 250 MG tablet  Commonly known as:  ZITHROMAX Z-PAK     furosemide 20 MG tablet  Commonly known as:  LASIX     meloxicam 7.5 MG tablet  Commonly known as:  MOBIC     metoprolol-hydrochlorothiazide 100-25 MG per tablet  Commonly known as:  LOPRESSOR HCT     omeprazole 20 MG capsule  Commonly known as:  PRILOSEC  Replaced by:  pantoprazole 40 MG tablet      TAKE these medications       acetaminophen 325 MG tablet  Commonly known as:  TYLENOL  Take 2 tablets (650 mg total) by mouth every 4 (four) hours as needed for headache or mild pain.     albuterol (2.5 MG/3ML) 0.083% nebulizer solution  Commonly known as:  PROVENTIL  Take 2.5 mg by nebulization every 6 (six) hours as needed for wheezing or shortness of breath.     aspirin 81 MG tablet  Take 81 mg by mouth daily.     enalapril 20 MG tablet  Commonly known as:  VASOTEC  Take 20 mg by mouth 2 (two) times daily.     fenofibrate micronized 134 MG capsule  Commonly known as:  LOFIBRA  Take 134 mg by mouth daily before breakfast.     fish oil-omega-3 fatty acids 1000 MG capsule  Take 2 g by mouth daily.     fluticasone 50 MCG/ACT nasal spray  Commonly known as:  FLONASE  Place 1 spray into both nostrils  daily.     Fluticasone-Salmeterol 250-50 MCG/DOSE Aepb  Commonly known as:  ADVAIR  Inhale 1 puff into the lungs 2 (two) times daily.     folic acid 779 MCG tablet  Commonly known as:  FOLVITE  Take 400 mcg by mouth daily.     glimepiride 2 MG tablet  Commonly known as:  AMARYL  Take 2 mg by mouth 2 (two) times daily.     hydrochlorothiazide 25 MG tablet  Commonly known as:  HYDRODIURIL  Take 1 tablet (25 mg total) by mouth daily.     LANTUS SOLOSTAR 100 UNIT/ML Solostar Pen  Generic drug:  Insulin Glargine  Inject 15 Units into the skin every morning.     metFORMIN 1000 MG tablet  Commonly known as:  GLUCOPHAGE  Take 1,000 mg by mouth 2 (two) times daily with a meal.     metoprolol succinate 100 MG 24 hr tablet  Commonly known as:  TOPROL-XL  Take 1 tablet (100 mg total) by mouth daily. Take with or immediately following a meal.     nitroGLYCERIN 0.4 MG SL tablet  Commonly known as:  NITROSTAT  Place 1 tablet (0.4 mg total) under the tongue every 5 (five) minutes as needed for chest pain (CP or SOB).     pantoprazole 40 MG tablet  Commonly known as:  PROTONIX  Take 1 tablet (40 mg total) by mouth daily.     potassium chloride SA 20 MEQ tablet  Commonly known as:  K-DUR,KLOR-CON  Take 20 mEq by mouth daily.     predniSONE 10 MG tablet  Commonly known as:  DELTASONE  Take 2 tablets (20 mg total) by mouth 2 (two) times daily.     rosuvastatin 40 MG tablet  Commonly known as:  CRESTOR  Take 40 mg by mouth daily.     sodium chloride 0.65 % Soln nasal spray  Commonly known as:  OCEAN  Place 1 spray into both nostrils as needed for congestion.     ticagrelor 90 MG Tabs tablet  Commonly known as:  BRILINTA  Take 1 tablet (90 mg total) by mouth 2 (two) times daily.     tiotropium 18 MCG inhalation capsule  Commonly known as:  SPIRIVA  Place 18 mcg into inhaler and inhale daily.     traMADol 50 MG tablet  Commonly known as:  ULTRAM  Take 50 mg by mouth every 6  (six) hours as needed for moderate pain.     vitamin E 400 UNIT capsule  Take 400 Units by mouth daily.       Follow-up Information   Follow up with Lauree Chandler, MD On 03/06/2014. (at 3:15 pm )    Specialty:  Cardiology   Contact information:   [address]  [place] [address] [phone]        Discharge Instructions: Call [facility] [phone] if any bleeding, swelling or drainage at cath site.  May shower, no tub baths for 48 hours for groin sticks. No lifting over 5 pounds for 1 week, none over 10 pounds for 2 weeks, no driving for 2 weeks.  Take 1 NTG, under your tongue, while sitting.  If no relief of pain may repeat NTG, one tab every 5 minutes up to 3 tablets total over 15 minutes.  If no relief CALL 911.  If you have dizziness/lightheadness  while taking NTG, stop taking and call 911.        Heart Healthy Diabetic diet   Signed: [id number],VXBLT R Nurse Practitioner-Certified [facility]: [facility] 02/21/2014, 9:52 AM  Time spent on discharge : >30 minutes.

## 2014-02-21 NOTE — Care Management Note (Signed)
    Page 1 of 2   02/21/2014     1:56:37 PM CARE MANAGEMENT NOTE 02/21/2014  Patient:  Michelle [name],Michelle [name]   Account Number:  [id number]  Date Initiated:  02/21/2014  Documentation initiated by:  Breindel Collier  Subjective/Objective Assessment:   Pt adm on 02/18/14 with inferior STEMI.  PTA, pt lives at home alone,  but has supportive family nearby.     Action/Plan:   Pt to dc on Brilinta therapy.  She states she has received 30 day free trial card.   Anticipated DC Date:  02/21/2014   Anticipated DC Plan:  [place]  CM consult      [facility] Choice  HOME HEALTH   Choice offered to / List presented to:  C-1 Patient        [place] arranged  [place] [facility]      [place].   Status of service:  Completed, signed off Medicare Important Message given?  YES (If response is "NO", the following Medicare IM given date fields will be blank) Date Medicare IM given:  02/21/2014 Medicare IM given by:  Ceanna Wareing Date Additional Medicare IM given:   Additional Medicare IM given by:    Discharge Disposition:  [place]  Per UR Regulation:  Reviewed for med. necessity/level of care/duration of stay  If discussed at [place] of Stay Meetings, dates discussed:    Comments:  02/21/14 Ellan Lambert, RN, BSN [phone]  BRILANTA 90 MG BID  COVER- YES CO-PAY - $ 6.00 30 DAYS SUPPLY PRIOR APPROVAL- NO PHARMACY : ANY PHARMACY  Pt also needs HHPT follow up at dc; referral to [facility], per pt choice.  Start of care 24-48h post dc date.  Pt has RW at home, if needed.  Contact #s for HH:  home:[phone]

## 2014-02-21 NOTE — Discharge Instructions (Signed)
Call [facility] [phone] if any bleeding, swelling or drainage at cath site.  May shower, no tub baths for 48 hours for groin sticks. No lifting over 5 pounds for 1 week, none over 10 pounds for 2 weeks, no driving for 2 weeks.  Take 1 NTG, under your tongue, while sitting.  If no relief of pain may repeat NTG, one tab every 5 minutes up to 3 tablets total over 15 minutes.  If no relief CALL 911.  If you have dizziness/lightheadness  while taking NTG, stop taking and call 911.        Heart Healthy Diabetic diet   I changed the prednisone to tapering dose and then stop.  We stopped your lasix, if you become more short of breath please call the office. Do Not stop Brilinta

## 2014-02-21 NOTE — Progress Notes (Signed)
Subjective: No complaints except some SOB but no different than her usual  Objective: Vital signs in last 24 hours: Temp:  [97.4 F (36.3 C)-98.2 F (36.8 C)] 97.4 F (36.3 C) (08/07 0545) Pulse Rate:  [80-97] 97 (08/07 0545) Resp:  [16-18] 18 (08/07 0545) BP: (99-135)/(51-60) 122/59 mmHg (08/07 0545) SpO2:  [96 %-100 %] 96 % (08/07 0712) Weight:  [170 lb 8 oz (77.338 kg)] 170 lb 8 oz (77.338 kg) (08/07 0545) Weight change:  Last BM Date: 02/20/14 Intake/Output from previous day: +405 08/06 0701 - 08/07 0700 In: 730 [P.O.:730] Out: 325 [Urine:325] Intake/Output this shift:    PE: General:Pleasant affect, NAD Skin:Warm and dry, brisk capillary refill HEENT:normocephalic, sclera clear, mucus membranes moist Heart:S1S2 RRR without murmur, gallup, rub or click Lungs:clear without rales, rhonchi, or wheezes HUT:MLYY, non tender, + BS, do not palpate liver spleen or masses Ext:no lower ext edema, 2+ pedal pulses, 2+ radial pulses, Rt forearm hematoma, post cath improving. Neuro:alert and oriented, MAE, follows commands, + facial symmetry   Tele:  STach at times Lab Results:  Recent Labs  02/19/14 0228  WBC 8.8  HGB 10.8*  HCT 33.9*  PLT 277   BMET  Recent Labs  02/19/14 0228 02/20/14 0336  NA 140 142  K 4.0 4.1  CL 105 107  CO2 24 21  GLUCOSE 111* 185*  BUN 24* 19  CREATININE 0.99 0.83  CALCIUM 8.9 9.6    Recent Labs  02/18/14 2000 02/19/14 0228  TROPONINI >20.00* >20.00*    Lab Results  Component Value Date   CHOL 147 02/19/2014   HDL 43 02/19/2014   LDLCALC UNABLE TO CALCULATE IF TRIGLYCERIDE OVER 400 mg/dL 02/19/2014   LDLDIRECT 44.8 10/10/2012   TRIG 521* 02/19/2014   CHOLHDL 3.4 02/19/2014   Lab Results  Component Value Date   HGBA1C 6.6* 02/18/2014     Lab Results  Component Value Date   TSH 1.20 10/09/2009    Hepatic Function Panel  Recent Labs  02/19/14 0228  PROT 5.9*  ALBUMIN 3.0*  AST 113*  ALT 40*  ALKPHOS 69    BILITOT 0.3  BILIDIR <0.2  IBILI NOT CALCULATED    Recent Labs  02/19/14 0228  CHOL 147   No results found for this basename: PROTIME,  in the last 72 hours  Lipid Panel     Component Value Date/Time   CHOL 147 02/19/2014 0228   TRIG 521* 02/19/2014 0228   HDL 43 02/19/2014 0228   CHOLHDL 3.4 02/19/2014 0228   VLDL UNABLE TO CALCULATE IF TRIGLYCERIDE OVER 400 mg/dL 02/19/2014 0228   LDLCALC UNABLE TO CALCULATE IF TRIGLYCERIDE OVER 400 mg/dL 02/19/2014 5035      Studies/Results: Echo:Study Conclusions  - Left ventricle: The cavity size was normal. Wall thickness was increased in a pattern of mild LVH. Systolic function was normal. The estimated ejection fraction was in the range of 60% to 65%. Doppler parameters are consistent with abnormal left ventricular relaxation (grade 1 diastolic dysfunction). - Mitral valve: There was trivial regurgitation. - Right ventricle: The cavity size was mildly dilated. Systolic function was normal. - Pulmonary arteries: No complete TR doppler jet so unable to estimate PA systolic pressure. - Inferior vena cava: The vessel was normal in size. The respirophasic diameter changes were in the normal range (>= 50%), consistent with normal central venous pressure. - Pericardium, extracardiac: A trivial pericardial effusion was identified posterior to the heart.  Impressions:  -  Normal LV size with mild LV hypertrophy. EF 60-65%. Mildly dilated RV with normal systolic function. No significant valvular abnormalities. There was a trivial posterior pericardial effusion.  PROCEDURE: Left heart catheterization with selective coronary angiography, left ventriculogram. PCI RCA; aspiration thrombectomy of the RCA. 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  INDICATIONS: Acute Inferior MI in a patient with ongoing, active chest pain- brought to the Cath Lab emergently for angiography and intervention.  The risks,  benefits, and details of the procedure were explained to the patient. The patient verbalized understanding and wanted to proceed. Emergency, implied consent was obtained.  PROCEDURE TECHNIQUE: After Xylocaine anesthesia a 13F slender sheath was placed in the right radial artery with a single anterior needle wall stick. Right coronary angiography was done using a Judkins R4 guide catheter. Left coronary angiography was done using a Judkins L3.5 guide catheter. Left ventriculography was done using a pigtail catheter. The intervention was performed. Please see below for details. A TR band was used for hemostasis.  CONTRAST: Total of 160 cc.  COMPLICATIONS: None.  HEMODYNAMICS: Aortic pressure was 136/68; LV pressure was 139/10; LVEDP 21. There was no gradient between the left ventricle and aorta.  ANGIOGRAPHIC DATA: The left main coronary artery is widely patent.  The left anterior descending artery is a large vessel which wraps around the apex. There is mild disease in the mid vessel after the origin of a large diagonal area. The distal RCA system fills by left to right collaterals. There is a large branching diagonal vessel which is widely patent.  The left circumflex artery is a large vessel. There is mild, diffuse atherosclerosis. There is a large second obtuse marginal. The first obtuse marginal is small but patent.  The right coronary artery is occluded in the midportion. After revascularization, it is noted that there is a very large aneurysm, likely at the prior angioplasty site in the mid RCA, just past the occlusion. There is severe lesions just before and after the aneurysm. There is moderate disease in the remainder of the distal RCA. The posterior lateral artery is very large and patent.  LEFT VENTRICULOGRAM: Left ventricular angiogram was done in the 30 RAO projection and revealed normal left ventricular wall motion and systolic function with an estimated ejection fraction of 55%. LVEDP was 19  mmHg.  PCI NARRATIVE: IV heparin and IV tirofiban were used for anticoagulation. ACT was used to check that the heparin was therapeutic. A JR 4 guiding catheter was used to engage the RCA. A pro-water wire was advanced to the distal RCA with little difficulty. It was noted that there was some coiling of the wire in the mid RCA. It was redirected and then went to the distal vessel. Aspiration thrombectomy was performed. A 2.5 balloon was advanced to the mid RCA but would not cross the area of stenosis. It was deployed in the proximal RCA with the hopes that we could get into the lesion. After deployment, it would not advance. A 2.0 x 12 predilatation balloon was used in a similar fashion as it would not easily cross. At this point, contrast staining was noted in the mid RCA. It appeared to be an aneurysm but to make sure that we had not perforated, a stat echocardiogram was obtained. A 1.2 mm balloon was then advanced and did successfully cross the area of disease in the mid RCA. The balloon was inflated to high pressure to try and make a channel. A  1.5 noncompliant balloon was then successfully advanced and inflated to high pressure. The echocardiogram was done and showed no pericardial effusion. A 2.0 x 12 noncompliant balloon was then advanced and inflated in the area occlusion in the mid RCA. A 2.5 balloon was then inflated as well in the area of occlusion in the mid RCA. We attempted to deliver a 2.8 x 19 graft master covered stent to the mid RCA to cover the aneurysm. It would not traverse the severe calcific disease. A mailman wire was advanced down into the RCA. Over the 2 wires, the graft master stent did travel down into the mid RCA. The mailman wire was removed.  The Covered stent was deployed to cover the aneurysm. A covered stent was used because there was high-risk for perforation with stenting of the junction of the aneurysm and more normal vessel, due to heavy calcification and atherosclerotic  burden. Small noncompliant balloons were required to make a channel and given the likely weakness of the vessel at the distal portion of the aneurysm, I felt a graft master stent was safe this. Given her large area of ongoing ischemia, active chest pain, if a perforation had occurred at that point, she likely would not have survived.  This was the longest stent but it was not long enough to cover the atherosclerotic disease in the more normal portions of the RCA which are both distal as well as proximal to the aneurysm. The mailman wire was placed back in the vessel. The covered stent was post dilated with a 3.0 noncompliant balloon. A 2.75 x 20 promus drug-eluting stent was advanced to the distal edge of the graft master stent. It was deployed in overlapping fashion. A 3.0 x 8 Promus drug-eluting stent was then deployed overlapping the proximal edge of the covered stent. The entire stented segment was post dilated with a 3.25 noncompliant balloon. There was an excellent angiographic result. The patient was restless during the procedure and required a fair amount of sedation. Overall, she tolerated the procedure well.  IMPRESSIONS:  1. Normal left main coronary artery. 2. Mild disease in the mid left anterior descending artery and its branches. 3. Mild disease in the left circumflex artery and its branches. 4. 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 Treated with a 2.8 x 19 mm Covered stent. Distal to stent, a 2.75 x 20 mm Promus was deployed. Proximal to the covered stent, a 3.0 x 8 mm stent was deployed. Entire stented area dilated to 3.3 mm in diameter.  5. Normal left ventricular systolic function. LVEDP 19 mmHg. Ejection fraction 55%. RECOMMENDATION: Continue dual antiplatelet therapy indefinitely. She'll be watched in the ICU. Continue aggressive secondary prevention. I would continue dual antiplatelet therapy longer than usual given the covered stent.  A covered  stent was used to cover the RCA aneurysm because athersclerostic disease causing MI was adjacent to the aneurysm. If the aneurysm ruptured during the procedure with conventional stent deployment, orout side of the hospital after our manipulation, the patient would have tamponade and possibly death. Therefore, we elected to cover the aneurysm.  F/u with Dr. Angelena Form.        Medications: I have reviewed the patient's current medications. Scheduled Meds: . aspirin EC  81 mg Oral Daily  . atorvastatin  80 mg Oral q1800  . enalapril  20 mg Oral BID  . fenofibrate  160 mg Oral Daily  . fluticasone  1 spray Each Nare Daily  . glimepiride  2 mg Oral BID WC  . metoprolol tartrate  100 mg Oral Daily   And  . hydrochlorothiazide  25 mg Oral Daily  . insulin aspart  0-15 Units Subcutaneous TID WC  . insulin aspart  0-5 Units Subcutaneous QHS  . insulin glargine  15 Units Subcutaneous Daily  . mometasone-formoterol  2 puff Inhalation BID  . ondansetron (ZOFRAN) IV  4 mg Intravenous Once  . pantoprazole  40 mg Oral Daily  . potassium chloride SA  20 mEq Oral Daily  . predniSONE  20 mg Oral BID WC  . ticagrelor  90 mg Oral BID  . tiotropium  18 mcg Inhalation Daily  . vitamin E  400 Units Oral Daily   Continuous Infusions:  PRN Meds:.acetaminophen, albuterol, ALPRAZolam, HYDROmorphone (DILAUDID) injection, nitroGLYCERIN, ondansetron (ZOFRAN) IV, sodium chloride, traMADol, zolpidem  Assessment/Plan:  1. Inferior STEMI. S/p PCI of the RCA with covered stent used to cover RCA aneurysm. Pk troponin Continue DAPT with ASA and Brilinta. Will transfer to telemetry today. Awaiting PT evaluation. On Metoprolol, statin, and ACEi. Anticipate DC today.   2. DM. Continue glipizide and insulin. On SSI. HgB A1C 6.6; glucose 85-219, resume metformin at discharge  3. HTN- continue ACEi, metoprolol, HCTZ   4. Dyslipidemia. On high dose lipitor and fenofibrate. Will need recheck in 6 weeks as outpt.  5.  Right forearm hematoma. Resolving.   6. Deconditioning. PT consult today. They recommended Home health PT     7.  COPD on chronic predniosone     LOS: 3 days   Time spent with pt. :15 minutes. [facility]  Nurse Practitioner Certified Pager [phone] or after 5pm and on weekends call [phone] 02/21/2014, 8:04 AM   Patient seen and examined. Agree with assessment and plan. No recurrent chest pain or dyspnea. Rhythm sinus but hr still increased Will change metoprolol tartrate  100 mg daily to metoprolol succinate 100 daily. Large are of R forearm ecchymosis; slowly improving. OK for DC today.   Troy Sine, MD, [facility] 02/21/2014 9:16 AM

## 2014-02-21 NOTE — Progress Notes (Signed)
Patient setting in chair c/o legs hurt all over . They do this at home. Medicated with one ultram 50 mg p.o. Assist patient back to bed and gave emotional support and stayed with her. She would try to go to sleep then would have 10-12 sec. Of sleep apnea freq. and wake up gasping "I can't do this" v.s.s. Applied  O2 for comfort. She was unable to wear it; "It is smothering me." Patient asking for breathing tx. Lungs clear. Resp called to come give prn treatment. Patietn says she does this at home but worse.

## 2014-02-23 ENCOUNTER — Other Ambulatory Visit: Payer: Self-pay | Admitting: Internal Medicine

## 2014-03-03 ENCOUNTER — Other Ambulatory Visit: Payer: Self-pay | Admitting: Internal Medicine

## 2014-03-05 ENCOUNTER — Encounter (HOSPITAL_COMMUNITY): Payer: Self-pay | Admitting: Emergency Medicine

## 2014-03-05 ENCOUNTER — Inpatient Hospital Stay (HOSPITAL_COMMUNITY)
Admission: EM | Admit: 2014-03-05 | Discharge: 2014-03-06 | DRG: 293 | Disposition: A | Discharge status: Home / Self Care | Payer: Medicare HMO | Attending: Cardiovascular Disease | Admitting: Cardiovascular Disease

## 2014-03-05 ENCOUNTER — Emergency Department (HOSPITAL_COMMUNITY): Payer: Medicare HMO

## 2014-03-05 ENCOUNTER — Other Ambulatory Visit: Payer: Self-pay | Admitting: Internal Medicine

## 2014-03-05 DIAGNOSIS — Z8249 Family history of ischemic heart disease and other diseases of the circulatory system: Secondary | ICD-10-CM | POA: Diagnosis not present

## 2014-03-05 DIAGNOSIS — I10 Essential (primary) hypertension: Secondary | ICD-10-CM | POA: Diagnosis present

## 2014-03-05 DIAGNOSIS — E1169 Type 2 diabetes mellitus with other specified complication: Secondary | ICD-10-CM | POA: Diagnosis present

## 2014-03-05 DIAGNOSIS — I319 Disease of pericardium, unspecified: Secondary | ICD-10-CM | POA: Diagnosis present

## 2014-03-05 DIAGNOSIS — E119 Type 2 diabetes mellitus without complications: Secondary | ICD-10-CM | POA: Diagnosis present

## 2014-03-05 DIAGNOSIS — Z794 Long term (current) use of insulin: Secondary | ICD-10-CM | POA: Diagnosis not present

## 2014-03-05 DIAGNOSIS — Z9861 Coronary angioplasty status: Secondary | ICD-10-CM | POA: Diagnosis not present

## 2014-03-05 DIAGNOSIS — I509 Heart failure, unspecified: Secondary | ICD-10-CM | POA: Diagnosis present

## 2014-03-05 DIAGNOSIS — R0602 Shortness of breath: Secondary | ICD-10-CM | POA: Diagnosis present

## 2014-03-05 DIAGNOSIS — J449 Chronic obstructive pulmonary disease, unspecified: Secondary | ICD-10-CM | POA: Diagnosis present

## 2014-03-05 DIAGNOSIS — Z9849 Cataract extraction status, unspecified eye: Secondary | ICD-10-CM | POA: Diagnosis not present

## 2014-03-05 DIAGNOSIS — E785 Hyperlipidemia, unspecified: Secondary | ICD-10-CM | POA: Diagnosis present

## 2014-03-05 DIAGNOSIS — Z79899 Other long term (current) drug therapy: Secondary | ICD-10-CM

## 2014-03-05 DIAGNOSIS — I2510 Atherosclerotic heart disease of native coronary artery without angina pectoris: Secondary | ICD-10-CM | POA: Diagnosis present

## 2014-03-05 DIAGNOSIS — I5031 Acute diastolic (congestive) heart failure: Principal | ICD-10-CM | POA: Diagnosis present

## 2014-03-05 DIAGNOSIS — Z7902 Long term (current) use of antithrombotics/antiplatelets: Secondary | ICD-10-CM

## 2014-03-05 DIAGNOSIS — I213 ST elevation (STEMI) myocardial infarction of unspecified site: Secondary | ICD-10-CM | POA: Diagnosis present

## 2014-03-05 DIAGNOSIS — Z87891 Personal history of nicotine dependence: Secondary | ICD-10-CM

## 2014-03-05 DIAGNOSIS — Z955 Presence of coronary angioplasty implant and graft: Secondary | ICD-10-CM

## 2014-03-05 DIAGNOSIS — Z7982 Long term (current) use of aspirin: Secondary | ICD-10-CM | POA: Diagnosis not present

## 2014-03-05 DIAGNOSIS — I2119 ST elevation (STEMI) myocardial infarction involving other coronary artery of inferior wall: Secondary | ICD-10-CM | POA: Diagnosis present

## 2014-03-05 DIAGNOSIS — N289 Disorder of kidney and ureter, unspecified: Secondary | ICD-10-CM | POA: Diagnosis present

## 2014-03-05 DIAGNOSIS — Z9089 Acquired absence of other organs: Secondary | ICD-10-CM

## 2014-03-05 DIAGNOSIS — E1159 Type 2 diabetes mellitus with other circulatory complications: Secondary | ICD-10-CM | POA: Diagnosis present

## 2014-03-05 DIAGNOSIS — I359 Nonrheumatic aortic valve disorder, unspecified: Secondary | ICD-10-CM

## 2014-03-05 DIAGNOSIS — J4489 Other specified chronic obstructive pulmonary disease: Secondary | ICD-10-CM | POA: Diagnosis present

## 2014-03-05 HISTORY — DX: Shortness of breath: R06.02

## 2014-03-05 HISTORY — DX: Angina pectoris, unspecified: I20.9

## 2014-03-05 HISTORY — DX: Acute myocardial infarction, unspecified: I21.9

## 2014-03-05 HISTORY — DX: Unspecified osteoarthritis, unspecified site: M19.90

## 2014-03-05 LAB — BASIC METABOLIC PANEL
Anion gap: 17 — ABNORMAL HIGH (ref 5–15)
BUN: 31 mg/dL — AB (ref 6–23)
CALCIUM: 9 mg/dL (ref 8.4–10.5)
CO2: 18 mEq/L — ABNORMAL LOW (ref 19–32)
CREATININE: 1.38 mg/dL — AB (ref 0.50–1.10)
Chloride: 106 mEq/L (ref 96–112)
GFR calc Af Amer: 41 mL/min — ABNORMAL LOW (ref >90)
GFR calc non Af Amer: 35 mL/min — ABNORMAL LOW (ref >90)
Glucose, Bld: 156 mg/dL — ABNORMAL HIGH (ref 70–99)
Potassium: 4.1 mEq/L (ref 3.7–5.3)
Sodium: 141 mEq/L (ref 137–147)

## 2014-03-05 LAB — COMPREHENSIVE METABOLIC PANEL
ALT: 21 U/L (ref 0–35)
AST: 20 U/L (ref 0–37)
Albumin: 3.3 g/dL — ABNORMAL LOW (ref 3.5–5.2)
Alkaline Phosphatase: 48 U/L (ref 39–117)
Anion gap: 15 (ref 5–15)
BUN: 28 mg/dL — AB (ref 6–23)
CALCIUM: 9.4 mg/dL (ref 8.4–10.5)
CO2: 20 meq/L (ref 19–32)
CREATININE: 1.28 mg/dL — AB (ref 0.50–1.10)
Chloride: 108 mEq/L (ref 96–112)
GFR calc Af Amer: 45 mL/min — ABNORMAL LOW (ref >90)
GFR calc non Af Amer: 39 mL/min — ABNORMAL LOW (ref >90)
Glucose, Bld: 122 mg/dL — ABNORMAL HIGH (ref 70–99)
Potassium: 4.4 mEq/L (ref 3.7–5.3)
Sodium: 143 mEq/L (ref 137–147)
Total Bilirubin: 0.4 mg/dL (ref 0.3–1.2)
Total Protein: 6.4 g/dL (ref 6.0–8.3)

## 2014-03-05 LAB — TROPONIN I
Troponin I: <0.3 ng/mL (ref <0.30)
Troponin I: <0.3 ng/mL (ref <0.30)

## 2014-03-05 LAB — CBC WITH DIFFERENTIAL/PLATELET
Basophils Absolute: 0 10*3/uL (ref 0.0–0.1)
Basophils Relative: 1 % (ref 0–1)
EOS ABS: 0.1 10*3/uL (ref 0.0–0.7)
EOS PCT: 1 % (ref 0–5)
HEMATOCRIT: 31.3 % — AB (ref 36.0–46.0)
HEMOGLOBIN: 10.4 g/dL — AB (ref 12.0–15.0)
Lymphocytes Relative: 19 % (ref 12–46)
Lymphs Abs: 1.2 10*3/uL (ref 0.7–4.0)
MCH: 26.8 pg (ref 26.0–34.0)
MCHC: 33.2 g/dL (ref 30.0–36.0)
MCV: 80.7 fL (ref 78.0–100.0)
MONOS PCT: 7 % (ref 3–12)
Monocytes Absolute: 0.5 10*3/uL (ref 0.1–1.0)
Neutro Abs: 4.4 10*3/uL (ref 1.7–7.7)
Neutrophils Relative %: 72 % (ref 43–77)
Platelets: 191 10*3/uL (ref 150–400)
RBC: 3.88 MIL/uL (ref 3.87–5.11)
RDW: 16.3 % — ABNORMAL HIGH (ref 11.5–15.5)
WBC: 6.2 10*3/uL (ref 4.0–10.5)

## 2014-03-05 LAB — APTT: aPTT: 25 seconds (ref 24–37)

## 2014-03-05 LAB — GLUCOSE, CAPILLARY
GLUCOSE-CAPILLARY: 106 mg/dL — AB (ref 70–99)
Glucose-Capillary: 147 mg/dL — ABNORMAL HIGH (ref 70–99)
Glucose-Capillary: 108 mg/dL — ABNORMAL HIGH (ref 70–99)

## 2014-03-05 LAB — MAGNESIUM: Magnesium: 1.5 mg/dL (ref 1.5–2.5)

## 2014-03-05 LAB — I-STAT TROPONIN, ED: Troponin i, poc: 0.02 ng/mL (ref 0.00–0.08)

## 2014-03-05 LAB — PRO B NATRIURETIC PEPTIDE: Pro B Natriuretic peptide (BNP): 1401 pg/mL — ABNORMAL HIGH (ref 0–450)

## 2014-03-05 LAB — PROTIME-INR
INR: 1.04 (ref 0.00–1.49)
PROTHROMBIN TIME: 13.6 s (ref 11.6–15.2)

## 2014-03-05 LAB — D-DIMER, QUANTITATIVE (NOT AT ARMC): D DIMER QUANT: 0.65 ug{FEU}/mL — AB (ref 0.00–0.48)

## 2014-03-05 LAB — TSH: TSH: 0.936 u[IU]/mL (ref 0.350–4.500)

## 2014-03-05 IMAGING — CR DG CHEST 2V
2 series · 2 of 2 positions shown · non-contrast
Comparison: [DATE]

CLINICAL DATA: Short of breath

EXAM:
CHEST  2 VIEW

[w chest pa]
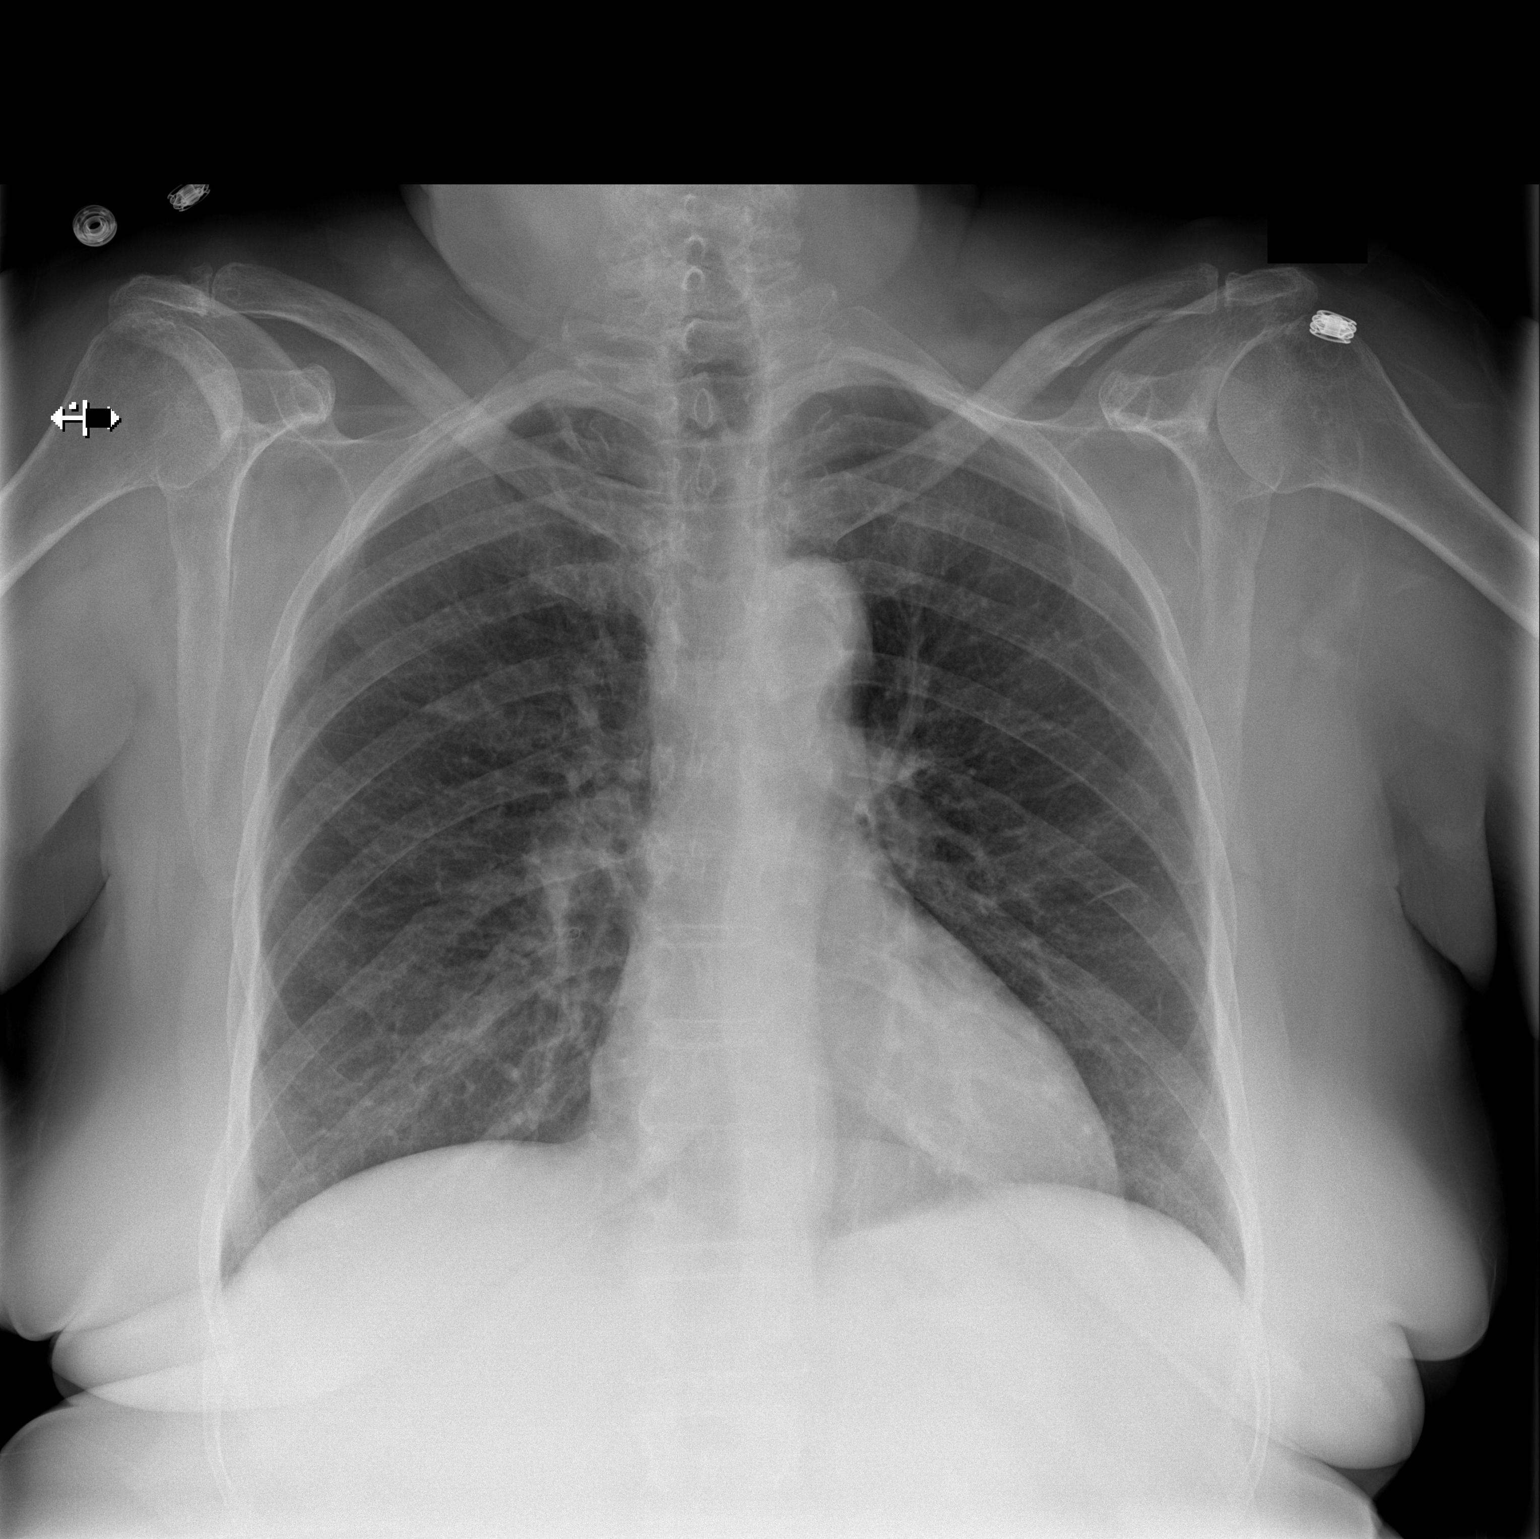

[w chest lat]
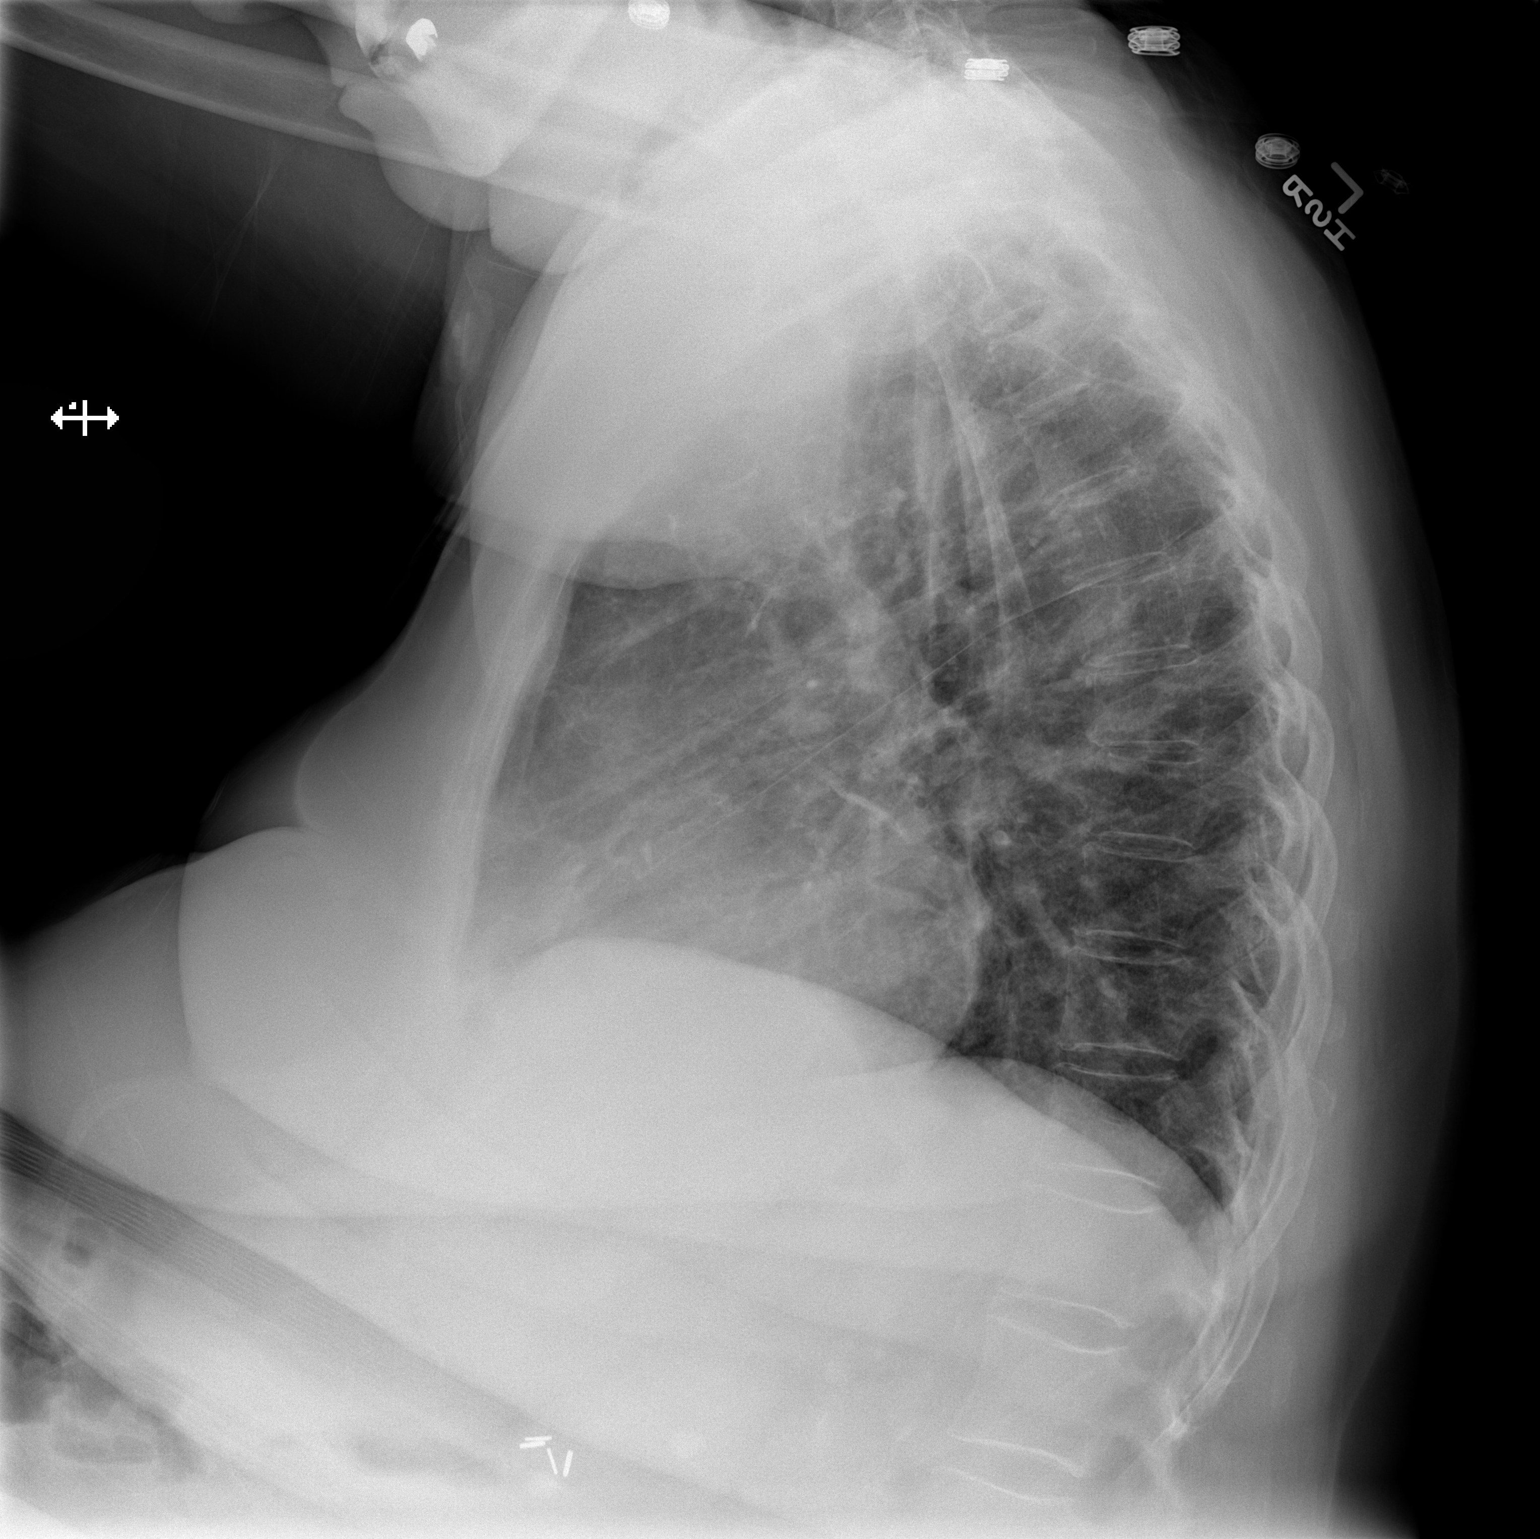

[2 of 2 positions shown; findings below may reference images not displayed]

FINDINGS: The heart size and mediastinal contours are within normal limits.
Both lungs are clear. The visualized skeletal structures are
unremarkable.
IMPRESSION: No active cardiopulmonary disease.

## 2014-03-05 MED ORDER — TICAGRELOR 90 MG PO TABS
90.0000 mg | ORAL_TABLET | Freq: Two times a day (BID) | ORAL | Status: DC
  Administered 2014-03-05 – 2014-03-06 (×2): 90 mg via ORAL
  Filled 2014-03-05 (×3): qty 1

## 2014-03-05 MED ORDER — MOMETASONE FURO-FORMOTEROL FUM 100-5 MCG/ACT IN AERO
2.0000 | INHALATION_SPRAY | Freq: Two times a day (BID) | RESPIRATORY_TRACT | Status: DC
  Administered 2014-03-05 – 2014-03-06 (×2): 2 via RESPIRATORY_TRACT
  Filled 2014-03-05: qty 8.8

## 2014-03-05 MED ORDER — SODIUM CHLORIDE 0.9 % IJ SOLN: 3.0000 mL | INTRAMUSCULAR | Status: DC | PRN

## 2014-03-05 MED ORDER — PANTOPRAZOLE SODIUM 40 MG PO TBEC
40.0000 mg | DELAYED_RELEASE_TABLET | Freq: Every day | ORAL | Status: DC
  Administered 2014-03-06: 40 mg via ORAL
  Filled 2014-03-05: qty 1

## 2014-03-05 MED ORDER — ALBUTEROL SULFATE (2.5 MG/3ML) 0.083% IN NEBU: 2.5000 mg | INHALATION_SOLUTION | Freq: Four times a day (QID) | RESPIRATORY_TRACT | Status: DC | PRN

## 2014-03-05 MED ORDER — ZOLPIDEM TARTRATE 5 MG PO TABS
5.0000 mg | ORAL_TABLET | Freq: Once | ORAL | Status: AC
  Administered 2014-03-05: 5 mg via ORAL
  Filled 2014-03-05: qty 1

## 2014-03-05 MED ORDER — ATORVASTATIN CALCIUM 80 MG PO TABS
80.0000 mg | ORAL_TABLET | Freq: Every day | ORAL | Status: DC
  Administered 2014-03-05: 80 mg via ORAL
  Filled 2014-03-05 (×2): qty 1

## 2014-03-05 MED ORDER — ASPIRIN EC 81 MG PO TBEC
81.0000 mg | DELAYED_RELEASE_TABLET | Freq: Every day | ORAL | Status: DC
  Administered 2014-03-06: 81 mg via ORAL
  Filled 2014-03-05: qty 1

## 2014-03-05 MED ORDER — TRAMADOL HCL 50 MG PO TABS
50.0000 mg | ORAL_TABLET | Freq: Four times a day (QID) | ORAL | Status: DC | PRN
  Administered 2014-03-06: 50 mg via ORAL
  Filled 2014-03-05: qty 1

## 2014-03-05 MED ORDER — METOPROLOL SUCCINATE ER 100 MG PO TB24
100.0000 mg | ORAL_TABLET | Freq: Every day | ORAL | Status: DC
  Administered 2014-03-06: 100 mg via ORAL
  Filled 2014-03-05 (×2): qty 1

## 2014-03-05 MED ORDER — ACETAMINOPHEN 325 MG PO TABS: 650.0000 mg | ORAL_TABLET | ORAL | Status: DC | PRN

## 2014-03-05 MED ORDER — FENOFIBRATE 160 MG PO TABS
160.0000 mg | ORAL_TABLET | Freq: Every day | ORAL | Status: DC
  Administered 2014-03-06: 160 mg via ORAL
  Filled 2014-03-05: qty 1

## 2014-03-05 MED ORDER — INSULIN GLARGINE 100 UNIT/ML SOLOSTAR PEN: 15.0000 [IU] | PEN_INJECTOR | Freq: Every morning | SUBCUTANEOUS | Status: DC

## 2014-03-05 MED ORDER — METFORMIN HCL 500 MG PO TABS
1000.0000 mg | ORAL_TABLET | Freq: Two times a day (BID) | ORAL | Status: DC
  Administered 2014-03-05: 1000 mg via ORAL
  Filled 2014-03-05 (×4): qty 2

## 2014-03-05 MED ORDER — TIOTROPIUM BROMIDE MONOHYDRATE 18 MCG IN CAPS
18.0000 ug | ORAL_CAPSULE | Freq: Every day | RESPIRATORY_TRACT | Status: DC
  Administered 2014-03-06: 18 ug via RESPIRATORY_TRACT
  Filled 2014-03-05: qty 5

## 2014-03-05 MED ORDER — POTASSIUM CHLORIDE CRYS ER 20 MEQ PO TBCR
20.0000 meq | EXTENDED_RELEASE_TABLET | Freq: Every day | ORAL | Status: DC
Start: 2014-03-05 — End: 2014-03-06
  Administered 2014-03-05: 20 meq via ORAL
  Filled 2014-03-05 (×2): qty 1

## 2014-03-05 MED ORDER — SODIUM CHLORIDE 0.9 % IJ SOLN
3.0000 mL | Freq: Two times a day (BID) | INTRAMUSCULAR | Status: DC
  Administered 2014-03-05 (×2): 3 mL via INTRAVENOUS

## 2014-03-05 MED ORDER — ENALAPRIL MALEATE 20 MG PO TABS
20.0000 mg | ORAL_TABLET | Freq: Two times a day (BID) | ORAL | Status: DC
  Administered 2014-03-05 – 2014-03-06 (×2): 20 mg via ORAL
  Filled 2014-03-05 (×3): qty 1

## 2014-03-05 MED ORDER — NITROGLYCERIN 0.4 MG SL SUBL: 0.4000 mg | SUBLINGUAL_TABLET | SUBLINGUAL | Status: DC | PRN

## 2014-03-05 MED ORDER — FLUTICASONE PROPIONATE 50 MCG/ACT NA SUSP
1.0000 | Freq: Every day | NASAL | Status: DC
  Administered 2014-03-06: 1 via NASAL
  Filled 2014-03-05: qty 16

## 2014-03-05 MED ORDER — INSULIN GLARGINE 100 UNIT/ML ~~LOC~~ SOLN
15.0000 [IU] | Freq: Every day | SUBCUTANEOUS | Status: DC
  Administered 2014-03-05: 15 [IU] via SUBCUTANEOUS
  Filled 2014-03-05 (×2): qty 0.15

## 2014-03-05 MED ORDER — FUROSEMIDE 10 MG/ML IJ SOLN
40.0000 mg | Freq: Once | INTRAMUSCULAR | Status: AC
  Administered 2014-03-05: 40 mg via INTRAVENOUS
  Filled 2014-03-05: qty 4

## 2014-03-05 MED ORDER — SODIUM CHLORIDE 0.9 % IV SOLN: 250.0000 mL | INTRAVENOUS | Status: DC | PRN

## 2014-03-05 MED ORDER — ONDANSETRON HCL 4 MG/2ML IJ SOLN: 4.0000 mg | Freq: Four times a day (QID) | INTRAMUSCULAR | Status: DC | PRN

## 2014-03-05 MED ORDER — ACETAMINOPHEN 325 MG PO TABS
650.0000 mg | ORAL_TABLET | ORAL | Status: DC | PRN
  Administered 2014-03-06: 650 mg via ORAL
  Filled 2014-03-05: qty 2

## 2014-03-05 MED ORDER — GLIMEPIRIDE 2 MG PO TABS
2.0000 mg | ORAL_TABLET | Freq: Two times a day (BID) | ORAL | Status: DC
  Administered 2014-03-05: 2 mg via ORAL
  Filled 2014-03-05 (×4): qty 1

## 2014-03-05 NOTE — ED Notes (Signed)
Pt here c/o SOB; pt sts 3 stents placed recently; pt sts SOB x 2 months that is worse over last day

## 2014-03-05 NOTE — H&P (Addendum)
Admit date: 03/05/2014 Referring Physician Dr. Jeanell Sparrow Primary Cardiologist Dr. Angelena Form Chief complaint/reason for admission:Chest pain  HPI: 80 y.o. female with a past medical history significant for known CAD with prior PTCA of the RCA in 1990 by Dr. Lia Foyer.   She also has diabetes with insulin listed, essential hypertension and hyperlipidemia. She also has COPD she quit smoking several years ago. She had been noticing some increasing episodes of dyspnea that required ER evaluation most recently on July 26 and then again on 8/4 at which time she was admitted. She had not had any resting or exertional chest pain until morning of admit- when she awoke at roughly 4 in the morning with substernal burning pressure that radiated to her jaw. She did note dyspnea associated with this and had mild nausea. She then came into the ED and received sublingual nitroglycerin that proceeded to cause significant hypotension with blood pressures in the 70s. Did alleviate her pain to some extent reducing it from 10/10 to roughly 4/10. She continued to have intermittent worsening symptoms. She did state that when she got to walk around that did exacerbate her symptoms. She was noted to have new EKG changes. She was taken to cath lab urgently for eval of possible occluded or subtotally occluded coronary artery due to continued chest pain and SOB.   Cath revealed right coronary artery occlusion  in the midportion. Aspiration thrombectomy was performed and and then a balloon was passed into the RCA but could not be passed into the mid vessel and so it was deployed in the prox RCA.  It was then noted that there was a very large aneurysm, likely at the prior angioplasty site in the mid RCA, just past the occlusion. There was severe lesions just before and after the aneurysm. There was moderate disease in the remainder of the distal RCA. The posterior lateral artery was very large and patent. Aneurysm in the mid right coronary artery  detected after occlusion cleared. This was treated with a 2.8 x 19 mm Covered stent and distal to stent, a 2.75 x 20 mm Promus was deployed. Proximal to the covered stent, a 3.0 x 8 mm stent was deployed. Entire stented area dilated to 3.3 mm in diameter. EF 55%. She did well post cath with a right arm hematoma and echo showed a small pericardial effusion the next day.  She was discharged to home on 8/7 and has done well.  She says that a few days ago she started having severe SOB but no chest pain.  She denies any of the chest discomfort that brought her in a few weeks ago. She denies any N/V or diaphoresis.  She denies any dizziness or LE edema.  Cardiology is now asked to evaluate.   PMH:    Past Medical History  Diagnosis Date  . COPD (chronic obstructive pulmonary disease)   . CAD (coronary artery disease) 1990; 2015    Cardiac cath 1990 with Dr. Lia Foyer and [name] Residual CAD, non obstructive per 2015 cath  . Hyperlipidemia with target LDL less than 70   . Essential hypertension   . Diabetes mellitus type 2, insulin dependent   . AV block, 1st degree   . S/P coronary artery stent placement 02/18/14, DES -RCA to cover RCA aneurysm 02/18/14    Promus DES to RCA with STEMI  PSH:    Past Surgical History  Procedure Laterality Date  . Cholecystectomy    . Cataract extraction  2010  . Ptca  1990    PTCA of RCA  . Transthoracic echocardiogram  02/02/2012    mild LVH, EF 55-60%, Normal WM, Gr 1 DD; Mild MR  . Coronary angioplasty with stent placement  02/18/14    Promus DES to RCA    ALLERGIES:   Codeine sulfate and Morphine sulfate  Prior to Admit Meds:   (Not in a hospital admission) Family HX:    Family History  Problem Relation Age of Onset  . Heart attack Mother 76  . Cancer Father 107   Social HX:    History   Social History    . Marital Status: Married    Spouse Name: N/A    Number of Children: 2  . Years of Education: N/A   Occupational History  . Retired    Social History Main Topics  . Smoking status: Former Smoker -- 1.00 packs/day for 55 years    Types: Cigarettes    Quit date: 09/29/2006  . Smokeless tobacco: Not on file  . Alcohol Use: No  . Drug Use: No  . Sexual Activity: Not on file   Other Topics Concern  . Not on file   Social History Narrative  . No narrative on file     ROS:  All 11 ROS were addressed and are negative except what is stated in the HPI  PHYSICAL EXAM Filed Vitals:   03/05/14 1014  BP: 138/58  Pulse:   Temp:   Resp: 12   General: Well developed, well nourished, in no acute distress Head: Eyes PERRLA, No xanthomas.   Normal cephalic and atramatic  Lungs:   Clear bilaterally to auscultation and percussion. Heart:   HRRR S1 S2 Pulses are 2+ & equal.            No carotid bruit. No JVD.  No abdominal bruits. No femoral bruits. Abdomen: Bowel sounds are positive, abdomen soft and non-tender without masses Extremities:   No clubbing, cyanosis or edema.  DP +1 Neuro: Alert and oriented X 3. Psych:  Good affect, responds appropriately   Labs:   Lab Results  Component Value Date   WBC 6.2 03/05/2014   HGB 10.4* 03/05/2014   HCT 31.3* 03/05/2014   MCV 80.7 03/05/2014   PLT 191 03/05/2014    Recent Labs Lab 03/05/14 0815  NA 141  K 4.1  CL 106  CO2 18*  BUN 31*  CREATININE 1.38*  CALCIUM 9.0  GLUCOSE 156*   Lab Results  Component Value Date   TROPONINI >20.00* 02/19/2014   No results found for this basename: PTT   No results found for this basename: INR, PROTIME     Lab Results  Component Value Date   CHOL 147 02/19/2014   CHOL 153 01/07/2014   CHOL 141 10/10/2012   Lab Results  Component Value Date   HDL 43 02/19/2014   HDL 40.20 01/07/2014   HDL 37.70* 10/10/2012   Lab Results  Component Value Date   LDLCALC UNABLE TO CALCULATE IF TRIGLYCERIDE  OVER 400 mg/dL 02/19/2014   Lab Results  Component Value Date   TRIG 521* 02/19/2014   TRIG 595.0 Triglyceride is over 400; calculations on Lipids are invalid.* 01/07/2014   TRIG 522.0 Triglyceride is over 400; calculations on Lipids are invalid.* 10/10/2012   Lab Results  Component Value Date   CHOLHDL 3.4  02/19/2014   CHOLHDL 4 01/07/2014   CHOLHDL 4 10/10/2012   Lab Results  Component Value Date   LDLDIRECT 44.8 10/10/2012   LDLDIRECT 39.0 01/23/2012   LDLDIRECT 37.3 07/08/2011      Radiology:  No results found.  EKG:  NSR with no ST changes  ASSESSMENT:  1.  SOB which has worsened over the past few days but no associated CP like she had on recent admission.  Troponin is neg x 1 but BNP is elevated but chest xray did now show any CHF and lungs are clear on exam with no LE edema.  Her last echo 2 weeks ago showed normal LVF with diastolic dysfunction and trivial pericardial effusion. 2. ASCAD s/p recent inferior STEMI with PCI of the mid thrombosed RCA involving an aneurysm with DES placed.  She is on DAPT indefinitely 3.  Type 2 DM 4.  Dyslipidemia 5.  COPD 6.  HTN 7.  Hematoma right forearm from recent PCI - slowly improving with good radial pulse 8.  ? Acute diastolic CHF - clinically she does not appear to be very volume overloaded.  The BNP is elevated but this can also be elevated in acute PE and COPD. 9.  Acute renal insufficiency  PLAN:   1.  Admit to tele bed 2.  Cycle cardiac enzymes 3.  2D echo to reassess LVF - unlikely to have changed but she did have a small pericardial effusion on last echo 4.  Continue home meds with Ticagrelor/ASA/statin/BB and ACE I 5.  Daily BMET to follow renal function closely which will hopefully improve with diuresis 6.  Check d-dimer 7.  Lasix 40mg  IV now then reassess in am   Sueanne Margarita, MD  03/05/2014  11:04 AM

## 2014-03-05 NOTE — Care Management Note (Addendum)
  Page 1 of 1   03/06/2014     3:16:27 PM CARE MANAGEMENT NOTE 03/06/2014  Patient:  Michelle [name],Michelle [name]   Account Number:  [id number]  Date Initiated:  03/05/2014  Documentation initiated by:  Rykar Lebleu  Subjective/Objective Assessment:   SOB, CP     Action/Plan:   CM to follow for disposition needs   Anticipated DC Date:  03/08/2014   Anticipated DC Plan:  HOME/SELF CARE         Choice offered to / List presented to:             Status of service:  Completed, signed off Medicare Important Message given?   (If response is "NO", the following Medicare IM given date fields will be blank) Date Medicare IM given:   Medicare IM given by:   Date Additional Medicare IM given:   Additional Medicare IM given by:    Discharge Disposition:  HOME/SELF CARE  Per UR Regulation:  Reviewed for med. necessity/level of care/duration of stay  If discussed at [place] of Stay Meetings, dates discussed:    Comments:  Elius Etheredge RN, BSN, MSHL, CCM  Nurse - Case Manager,  ([facility] [place])  [phone]  03/05/2014 SOB, CP Dispositon:  Home / Self care.

## 2014-03-05 NOTE — Progress Notes (Signed)
1224 report received from Arthur

## 2014-03-05 NOTE — Progress Notes (Signed)
  Echocardiogram 2D Echocardiogram has been performed.  Mauricio Po 03/05/2014, 2:32 PM

## 2014-03-05 NOTE — ED Provider Notes (Signed)
CSN: [id number]     Arrival date & time 03/05/14  8768 History   First MD Initiated Contact with Patient 03/05/14 [id number]     Chief Complaint  Patient presents with  . Shortness of Breath   Michelle [name] is a 80 yo caucasian F w/PMH of COPD, CAD and MI 2 wks ago w/cath and stent to RCA, AV 1st degree block, HTN, IDDM, and HLD who presents w/SOB. Patient says her shortness of breath is constant and has been present for a couple months. However the past 48 hours she feels like it's been worse. Patient says she can't understand it as she's never had this before. Not accompanied with any other symptoms. Not positional. Not accompanied with palpitations, chest pain, nausea, vomiting, diaphoresis, fevers, chills, cough, lower extremity swelling, lower extremity pain.  She denies CP, SOB, fever, chills, N/V, abd pain, diarrhea, constipation, hematemesis, dysuria, hematuria, sick contacts, or recent travel.   (Consider location/radiation/quality/duration/timing/severity/associated sxs/prior Treatment) Patient is a 80 y.o. female presenting with shortness of breath.  Shortness of Breath Severity:  Moderate Onset quality:  Gradual Duration:  2 months Timing:  Constant Progression:  Worsening (past 2 days) Chronicity:  New Relieved by:  Nothing Worsened by:  Nothing tried Ineffective treatments:  Inhaler, rest and sitting up Associated symptoms: no abdominal pain, no chest pain, no cough, no diaphoresis, no fever, no headaches, no hemoptysis, no neck pain, no PND, no rash, no sore throat, no sputum production, no syncope, no swollen glands, no vomiting and no wheezing   Risk factors: recent surgery   Risk factors: no tobacco use     Past Medical History  Diagnosis Date  . COPD (chronic obstructive pulmonary disease)   . CAD (coronary artery disease) 1990; 2015    Cardiac cath 1990 with Dr. Lia Foyer and pt reports blockage in artery  with angioplasty. She has pictures that show severe stenosis mid  RCA and a post PTCA picture with 30% residual stenosis post PTCA. Residual CAD, non obstructive per 2015 cath  . Hyperlipidemia with target LDL less than 70   . Essential hypertension   . Diabetes mellitus type 2, insulin dependent   . AV block, 1st degree   . 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 Myocardial infarction   . Anginal pain   . Shortness of breath   . Arthritis    Past Surgical History  Procedure Laterality Date  . Cholecystectomy    . Cataract extraction  2010  . Ptca  1990    PTCA of RCA  . Transthoracic echocardiogram  02/02/2012    mild LVH, EF 55-60%, Normal WM, Gr 1 DD; Mild MR  . Coronary angioplasty with stent placement  02/18/14    Promus DES to RCA   Family History  Problem Relation Age of Onset  . Heart attack Mother 35  . Cancer Father 88   History  Substance Use Topics  . Smoking status: Former Smoker -- 1.00 packs/day for 55 years    Types: Cigarettes    Quit date: 09/29/2006  . Smokeless tobacco: Never Used  . Alcohol Use: No   OB History   Grav Para Term Preterm Abortions TAB SAB Ect Mult Living                 Review of Systems  Unable to perform ROS Constitutional: Negative for fever, chills and diaphoresis.  HENT:  Negative for congestion, postnasal drip, rhinorrhea, sinus pressure, sneezing and sore throat.   Respiratory: Positive for shortness of breath. Negative for apnea, cough, hemoptysis, sputum production, choking, chest tightness, wheezing and stridor.   Cardiovascular: Negative for chest pain, palpitations, leg swelling, syncope and PND.  Gastrointestinal: Negative for nausea, vomiting, abdominal pain, diarrhea, constipation and abdominal distention.  Genitourinary: Negative for dysuria, frequency, flank pain and decreased urine volume.  Musculoskeletal: Negative for neck pain.  Skin: Negative for rash and wound.  Neurological: Negative for dizziness,  speech difficulty, light-headedness and headaches.  All other systems reviewed and are negative.     Allergies  Codeine sulfate and Morphine sulfate  Home Medications   Prior to Admission medications   Medication Sig Start Date End Date Taking? Authorizing Provider  acetaminophen (TYLENOL) 325 MG tablet Take 2 tablets (650 mg total) by mouth every 4 (four) hours as needed for headache or mild pain. 02/21/14  Yes Cecilie Kicks, NP  albuterol (PROVENTIL) (2.5 MG/3ML) 0.083% nebulizer solution Take 2.5 mg by nebulization 4 (four) times daily as needed for wheezing or shortness of breath.   Yes Historical Provider, MD  aspirin EC 81 MG tablet Take 81 mg by mouth daily.   Yes Historical Provider, MD  enalapril (VASOTEC) 20 MG tablet Take 20 mg by mouth 2 (two) times daily.   Yes Historical Provider, MD  fenofibrate micronized (LOFIBRA) 134 MG capsule Take 134 mg by mouth daily before breakfast.   Yes Historical Provider, MD  fish oil-omega-3 fatty acids 1000 MG capsule Take 2 g by mouth daily.     Yes Historical Provider, MD  fluticasone (FLONASE) 50 MCG/ACT nasal spray Place 1 spray into both nostrils daily. 01/15/14  Yes Merryl Hacker, MD  Fluticasone-Salmeterol (ADVAIR) 250-50 MCG/DOSE AEPB Inhale 1 puff into the lungs 2 (two) times daily.   Yes Historical Provider, MD  folic acid (FOLVITE) 161 MCG tablet Take 400 mcg by mouth daily.     Yes Historical Provider, MD  glimepiride (AMARYL) 2 MG tablet Take 2 mg by mouth 2 (two) times daily.   Yes Historical Provider, MD  glucose blood test strip 1 each by Other route 3 (three) times daily. Use as instructed   Yes Historical Provider, MD  hydrochlorothiazide (HYDRODIURIL) 25 MG tablet Take 1 tablet (25 mg total) by mouth daily. 02/21/14  Yes Cecilie Kicks, NP  Insulin Glargine (LANTUS SOLOSTAR) 100 UNIT/ML Solostar Pen Inject 15 Units into the skin every morning.    Yes Historical Provider, MD  metFORMIN (GLUCOPHAGE) 1000 MG tablet Take 1,000 mg by  mouth 2 (two) times daily with a meal.   Yes Historical Provider, MD  metoprolol succinate (TOPROL-XL) 100 MG 24 hr tablet Take 1 tablet (100 mg total) by mouth daily. Take with or immediately following a meal. 02/21/14  Yes Cecilie Kicks, NP  pantoprazole (PROTONIX) 40 MG tablet Take 1 tablet (40 mg total) by mouth daily. 02/21/14  Yes Cecilie Kicks, NP  potassium chloride SA (K-DUR,KLOR-CON) 20 MEQ tablet Take 20 mEq by mouth daily.   Yes Historical Provider, MD  rosuvastatin (CRESTOR) 40 MG tablet Take 40 mg by mouth daily.   Yes Historical Provider, MD  sodium chloride (OCEAN) 0.65 % SOLN nasal spray Place 1 spray into both nostrils as needed for congestion. 01/15/14  Yes Merryl Hacker, MD  ticagrelor (BRILINTA) 90 MG TABS tablet Take 1 tablet (90 mg total) by mouth 2 (two) times daily. 02/21/14  Yes Cecilie Kicks, NP  tiotropium [facility])  18 MCG inhalation capsule Place 18 mcg into inhaler and inhale daily.   Yes Historical Provider, MD  traMADol (ULTRAM) 50 MG tablet Take 50 mg by mouth every 6 (six) hours as needed for moderate pain.   Yes Historical Provider, MD  vitamin E 400 UNIT capsule Take 400 Units by mouth daily.     Yes Historical Provider, MD  nitroGLYCERIN (NITROSTAT) 0.4 MG SL tablet Place 1 tablet (0.4 mg total) under the tongue every 5 (five) minutes as needed for chest pain (CP or SOB). 02/21/14   Cecilie Kicks, NP  predniSONE (DELTASONE) 20 MG tablet Take 1 tablet (20 mg total) by mouth 2 (two) times daily with a meal. Take two tabs twice a day for 1 day 02/21/14, then take 2 tabs daily for 4 days, then 1 tab daily for 3 days then stop 02/21/14   Cecilie Kicks, NP   BP 142/69  Pulse 95  Temp(Src) 97.8 F (36.6 C) (Oral)  Resp 15  SpO2 100% Physical Exam  Nursing note and vitals reviewed. Constitutional: She is oriented to person, place, and time. She appears well-developed and well-nourished. No distress.  HENT:  Head: Normocephalic and atraumatic.  Cardiovascular: Normal rate,  regular rhythm, normal heart sounds and intact distal pulses.  Exam reveals no gallop and no friction rub.   No murmur heard. Pulmonary/Chest: Effort normal and breath sounds normal. No respiratory distress. She has no wheezes. She has no rales. She exhibits no tenderness.  Abdominal: Soft. Bowel sounds are normal. She exhibits no distension and no mass. There is no tenderness. There is no rebound and no guarding.  Musculoskeletal: Normal range of motion. She exhibits no edema and no tenderness.  Lymphadenopathy:    She has no cervical adenopathy.  Neurological: She is alert and oriented to person, place, and time. No cranial nerve deficit. Coordination normal.  Skin: Skin is warm and dry. She is not diaphoretic.    ED Course  Procedures (including critical care time) Labs Review Labs Reviewed  CBC WITH DIFFERENTIAL - Abnormal; Notable for the following:    Hemoglobin 10.4 (*)    HCT 31.3 (*)    RDW 16.3 (*)    All other components within normal limits  BASIC METABOLIC PANEL - Abnormal; Notable for the following:    CO2 18 (*)    Glucose, Bld 156 (*)    BUN 31 (*)    Creatinine, Ser 1.38 (*)    GFR calc non Af Amer 35 (*)    GFR calc Af Amer 41 (*)    Anion gap 17 (*)    All other components within normal limits  PRO B NATRIURETIC PEPTIDE - Abnormal; Notable for the following:    Pro B Natriuretic peptide (BNP) 1401.0 (*)    All other components within normal limits  D-DIMER, QUANTITATIVE - Abnormal; Notable for the following:    D-Dimer, Quant 0.65 (*)    All other components within normal limits  COMPREHENSIVE METABOLIC PANEL - Abnormal; Notable for the following:    Glucose, Bld 122 (*)    BUN 28 (*)    Creatinine, Ser 1.28 (*)    Albumin 3.3 (*)    GFR calc non Af Amer 39 (*)    GFR calc Af Amer 45 (*)    All other components within normal limits  GLUCOSE, CAPILLARY - Abnormal; Notable for the following:    Glucose-Capillary 106 (*)    All other components within  normal limits  GLUCOSE, CAPILLARY -  Abnormal; Notable for the following:    Glucose-Capillary 147 (*)    All other components within normal limits  TSH  PROTIME-INR  APTT  MAGNESIUM  TROPONIN I  TROPONIN I  TROPONIN I  BASIC METABOLIC PANEL  I-STAT TROPOININ, ED    Imaging Review Dg Chest 2 View  03/05/2014   CLINICAL DATA:  Short of breath  EXAM: CHEST  2 VIEW  COMPARISON:  02/18/2014  FINDINGS: The heart size and mediastinal contours are within normal limits. Both lungs are clear. The visualized skeletal structures are unremarkable.  IMPRESSION: No active cardiopulmonary disease.   Electronically Signed   By: Franchot Gallo M.D.   On: 03/05/2014 08:48     EKG Interpretation   Date/Time:  Wednesday March 05 2014 07:38:22 EDT Ventricular Rate:  95 PR Interval:  218 QRS Duration: 88 QT Interval:  360 QTC Calculation: 452 R Axis:   -45 Text Interpretation:  Sinus rhythm with 1st degree A-V block Left axis  deviation Low voltage QRS inferior q waves have resolved since last  tracing 19 February 2014 Confirmed by Jeanell Sparrow MD, Andee Poles [phone]) on 03/05/2014  8:08:24 AM      MDM   80 yo caucasian F w/SOB. Please see HPI for details. On exam, pt in NAD, AFVSS. Physical exam very benign with no signs of basilar crackles or other symptoms of volume overload and no wheezes or rhonchi to point toward COPD exacerabation. Chart review reveals patient has had the symptoms for several months which she endorses. Based on recent cath and stent will obtain CBC, BMP, chest x-ray, EKG, and troponin despite no chest pain.  EKG shows first-degree AV block with inferior Q waves. These are not new. No acute signs of ischemia or arrhythmia.   Creatinine fairly elevated but on chart review, has been at this level before. Fluctuates between 0.8 and 1.3. BNP elevated over 1400 which may be due to this AKI. Remainder of labs and imaging within normal limits. I-STAT troponin 0.03. Will consult cardiology for  their recommendations regarding her shortness of breath with her known cardiac history.  Pt will be admitted to Cardiology service for continuing monitoring. Please see their note for further details regarding the remainder of her hospital course. Throughout her time in the ED, pt remained stable.    Final diagnoses:  SOB (shortness of breath)  Coronary artery disease involving native coronary artery of native heart without angina pectoris  Diabetes mellitus type 2, insulin dependent  Essential hypertension    Pt was seen under the supervision of Dr. Jeanell Sparrow.     Sherian Maroon, MD 03/05/14 1719

## 2014-03-06 ENCOUNTER — Ambulatory Visit: Payer: Medicare HMO | Admitting: Cardiovascular Disease

## 2014-03-06 DIAGNOSIS — I319 Disease of pericardium, unspecified: Secondary | ICD-10-CM

## 2014-03-06 DIAGNOSIS — J449 Chronic obstructive pulmonary disease, unspecified: Secondary | ICD-10-CM

## 2014-03-06 DIAGNOSIS — I509 Heart failure, unspecified: Secondary | ICD-10-CM

## 2014-03-06 DIAGNOSIS — Z9861 Coronary angioplasty status: Secondary | ICD-10-CM

## 2014-03-06 DIAGNOSIS — I5031 Acute diastolic (congestive) heart failure: Principal | ICD-10-CM

## 2014-03-06 HISTORY — DX: Disease of pericardium, unspecified: I31.9

## 2014-03-06 LAB — BASIC METABOLIC PANEL
Anion gap: 19 — ABNORMAL HIGH (ref 5–15)
BUN: 29 mg/dL — AB (ref 6–23)
CALCIUM: 10.3 mg/dL (ref 8.4–10.5)
CO2: 19 mEq/L (ref 19–32)
CREATININE: 1.38 mg/dL — AB (ref 0.50–1.10)
Chloride: 104 mEq/L (ref 96–112)
GFR calc Af Amer: 41 mL/min — ABNORMAL LOW (ref >90)
GFR, EST NON AFRICAN AMERICAN: 35 mL/min — AB (ref >90)
GLUCOSE: 71 mg/dL (ref 70–99)
Potassium: 3.6 mEq/L — ABNORMAL LOW (ref 3.7–5.3)
Sodium: 142 mEq/L (ref 137–147)

## 2014-03-06 LAB — GLUCOSE, CAPILLARY: Glucose-Capillary: 84 mg/dL (ref 70–99)

## 2014-03-06 LAB — TROPONIN I

## 2014-03-06 MED ORDER — POTASSIUM CHLORIDE CRYS ER 20 MEQ PO TBCR
40.0000 meq | EXTENDED_RELEASE_TABLET | Freq: Once | ORAL | Status: AC
  Administered 2014-03-06: 40 meq via ORAL

## 2014-03-06 MED ORDER — CETYLPYRIDINIUM CHLORIDE 0.05 % MT LIQD
7.0000 mL | Freq: Two times a day (BID) | OROMUCOSAL | Status: DC
  Administered 2014-03-06: 7 mL via OROMUCOSAL

## 2014-03-06 MED ORDER — POTASSIUM CHLORIDE CRYS ER 20 MEQ PO TBCR
20.0000 meq | EXTENDED_RELEASE_TABLET | Freq: Every day | ORAL | Status: DC
  Filled 2014-03-06: qty 1

## 2014-03-06 NOTE — Plan of Care (Signed)
Problem: Phase I Progression Outcomes Goal: Pain controlled with appropriate interventions Outcome: Progressing Patient developed pain in feet during shift.  She described the pain as chronic similar in nature to neuropathy.  Encouraged patient to discuss with physicians this morning.  Pain relieved with Tylenol and Tramadol.  Will continue to monitor patient condition.

## 2014-03-06 NOTE — Discharge Summary (Signed)
Patient ID: Michelle [name],  MRN: [id number], DOB/AGE: June 02, 1933 80 y.o.  Admit date: 03/05/2014 Discharge date: 03/06/2014  Primary Care Provider: Chancy Hurter, MD Primary Cardiologist: Dr Julianne Handler  Discharge Diagnoses Principal Problem:   Acute diastolic CHF (congestive heart failure) Active Problems:   S/P RCA DES 02/18/14   SOB (shortness of breath)   IDDM type 2   STEMI 02/18/14- RCA DES   CAD- RCA PCI '90s, RCA DES 02/18/14   Pericarditis-post MI (short course of steroids)   Hyperlipidemia with target LDL less than 70   Essential hypertension   COPD    Procedures: Echo 03/05/14   Hospital Course:  80 y.o. female with a past medical history significant for known CAD with prior PTCA of the RCA in 1990 by Dr. Lia Foyer. She was admitted 02/18/14 with an inferior STEMI. She underwent cath and subsequent RCA DES placement. Her EF was 55% with a small pericardial effusion. She was discharged 02/21/14 on a short course of steroids. She did well initially then developed increasing SOB 48 hrs before admission. On the day of admission she was significantly SOB- "thought I was dying". In the ER her BNP was 1401. Her CXR showed NAD. She was admitted and given one dose of Lasix 40 IV. She diuresed 1.7L. An echo was done to r/o worsening pericardial effusion and this was WNL- EF 60-65%. We feel she may have had volume overload secondary to her short course of steroids. She is also is on Brilinta which can cause SOB. We did not change her medications. Her follow up will be re scheduled.     Discharge Vitals:  Blood pressure 124/61, pulse 81, temperature 97.6 F (36.4 C), temperature source Oral, resp. rate 20, height 5\' 5"  (1.651 m), weight 167 lb 8 oz (75.978 kg), SpO2 100.00%.    Labs: Results for orders placed during the hospital encounter of 03/05/14 (from the past 24 hour(s))  I-STAT TROPOININ, ED     Status: None   Collection Time    03/05/14  8:55 AM      Result Value Ref  Range   Troponin i, poc 0.02  0.00 - 0.08 ng/mL   Comment 3           GLUCOSE, CAPILLARY     Status: Abnormal   Collection Time    03/05/14 12:53 PM      Result Value Ref Range   Glucose-Capillary 106 (*) 70 - 99 mg/dL   Comment 1 Documented in Chart     Comment 2 Notify RN    COMPREHENSIVE METABOLIC PANEL     Status: Abnormal   Collection Time    03/05/14  2:45 PM      Result Value Ref Range   Sodium 143  137 - 147 mEq/L   Potassium 4.4  3.7 - 5.3 mEq/L   Chloride 108  96 - 112 mEq/L   CO2 20  19 - 32 mEq/L   Glucose, Bld 122 (*) 70 - 99 mg/dL   BUN 28 (*) 6 - 23 mg/dL   Creatinine, Ser 1.28 (*) 0.50 - 1.10 mg/dL   Calcium 9.4  8.4 - 10.5 mg/dL   Total Protein 6.4  6.0 - 8.3 g/dL   Albumin 3.3 (*) 3.5 - 5.2 g/dL   AST 20  0 - 37 U/L   ALT 21  0 - 35 U/L   Alkaline Phosphatase 48  39 - 117 U/L   Total Bilirubin 0.4  0.3 -  1.2 mg/dL   GFR calc non Af Amer 39 (*) >90 mL/min   GFR calc Af Amer 45 (*) >90 mL/min   Anion gap 15  5 - 15  TSH     Status: None   Collection Time    03/05/14  2:45 PM      Result Value Ref Range   TSH 0.936  0.350 - 4.500 uIU/mL  PROTIME-INR     Status: None   Collection Time    03/05/14  2:45 PM      Result Value Ref Range   Prothrombin Time 13.6  11.6 - 15.2 seconds   INR 1.04  0.00 - 1.49  APTT     Status: None   Collection Time    03/05/14  2:45 PM      Result Value Ref Range   aPTT 25  24 - 37 seconds  MAGNESIUM     Status: None   Collection Time    03/05/14  2:45 PM      Result Value Ref Range   Magnesium 1.5  1.5 - 2.5 mg/dL  TROPONIN I     Status: None   Collection Time    03/05/14  2:45 PM      Result Value Ref Range   Troponin I <0.30  <0.30 ng/mL  GLUCOSE, CAPILLARY     Status: Abnormal   Collection Time    03/05/14  4:31 PM      Result Value Ref Range   Glucose-Capillary 147 (*) 70 - 99 mg/dL   Comment 1 Documented in Chart     Comment 2 Notify RN    TROPONIN I     Status: None   Collection Time    03/05/14  7:50 PM        Result Value Ref Range   Troponin I <0.30  <0.30 ng/mL  GLUCOSE, CAPILLARY     Status: Abnormal   Collection Time    03/05/14  9:32 PM      Result Value Ref Range   Glucose-Capillary 108 (*) 70 - 99 mg/dL   Comment 1 Notify RN     Comment 2 Documented in Chart    TROPONIN I     Status: None   Collection Time    03/06/14  1:09 AM      Result Value Ref Range   Troponin I <0.30  <0.30 ng/mL  BASIC METABOLIC PANEL     Status: Abnormal   Collection Time    03/06/14  1:09 AM      Result Value Ref Range   Sodium 142  137 - 147 mEq/L   Potassium 3.6 (*) 3.7 - 5.3 mEq/L   Chloride 104  96 - 112 mEq/L   CO2 19  19 - 32 mEq/L   Glucose, Bld 71  70 - 99 mg/dL   BUN 29 (*) 6 - 23 mg/dL   Creatinine, Ser 1.38 (*) 0.50 - 1.10 mg/dL   Calcium 10.3  8.4 - 10.5 mg/dL   GFR calc non Af Amer 35 (*) >90 mL/min   GFR calc Af Amer 41 (*) >90 mL/min   Anion gap 19 (*) 5 - 15  GLUCOSE, CAPILLARY     Status: None   Collection Time    03/06/14  6:25 AM      Result Value Ref Range   Glucose-Capillary 84  70 - 99 mg/dL   Comment 1 Notify RN     Comment 2 Documented in Chart  Disposition:    Discharge Medications:    Medication List    STOP taking these medications       predniSONE 20 MG tablet  Commonly known as:  DELTASONE      TAKE these medications       acetaminophen 325 MG tablet  Commonly known as:  TYLENOL  Take 2 tablets (650 mg total) by mouth every 4 (four) hours as needed for headache or mild pain.     albuterol (2.5 MG/3ML) 0.083% nebulizer solution  Commonly known as:  PROVENTIL  Take 2.5 mg by nebulization 4 (four) times daily as needed for wheezing or shortness of breath.     aspirin EC 81 MG tablet  Take 81 mg by mouth daily.     enalapril 20 MG tablet  Commonly known as:  VASOTEC  Take 20 mg by mouth 2 (two) times daily.     fenofibrate micronized 134 MG capsule  Commonly known as:  LOFIBRA  Take 134 mg by mouth daily before breakfast.     fish  oil-omega-3 fatty acids 1000 MG capsule  Take 2 g by mouth daily.     fluticasone 50 MCG/ACT nasal spray  Commonly known as:  FLONASE  Place 1 spray into both nostrils daily.     Fluticasone-Salmeterol 250-50 MCG/DOSE Aepb  Commonly known as:  ADVAIR  Inhale 1 puff into the lungs 2 (two) times daily.     folic acid 497 MCG tablet  Commonly known as:  FOLVITE  Take 400 mcg by mouth daily.     glimepiride 2 MG tablet  Commonly known as:  AMARYL  Take 2 mg by mouth 2 (two) times daily.     glucose blood test strip  1 each by Other route 3 (three) times daily. Use as instructed     hydrochlorothiazide 25 MG tablet  Commonly known as:  HYDRODIURIL  Take 1 tablet (25 mg total) by mouth daily.     LANTUS SOLOSTAR 100 UNIT/ML Solostar Pen  Generic drug:  Insulin Glargine  Inject 15 Units into the skin every morning.     metFORMIN 1000 MG tablet  Commonly known as:  GLUCOPHAGE  Take 1,000 mg by mouth 2 (two) times daily with a meal.     metoprolol succinate 100 MG 24 hr tablet  Commonly known as:  TOPROL-XL  Take 1 tablet (100 mg total) by mouth daily. Take with or immediately following a meal.     nitroGLYCERIN 0.4 MG SL tablet  Commonly known as:  NITROSTAT  Place 1 tablet (0.4 mg total) under the tongue every 5 (five) minutes as needed for chest pain (CP or SOB).     pantoprazole 40 MG tablet  Commonly known as:  PROTONIX  Take 1 tablet (40 mg total) by mouth daily.     potassium chloride SA 20 MEQ tablet  Commonly known as:  K-DUR,KLOR-CON  Take 20 mEq by mouth daily.     rosuvastatin 40 MG tablet  Commonly known as:  CRESTOR  Take 40 mg by mouth daily.     sodium chloride 0.65 % Soln nasal spray  Commonly known as:  OCEAN  Place 1 spray into both nostrils as needed for congestion.     ticagrelor 90 MG Tabs tablet  Commonly known as:  BRILINTA  Take 1 tablet (90 mg total) by mouth 2 (two) times daily.     tiotropium 18 MCG inhalation capsule  Commonly known  as:  [place] 18  mcg into inhaler and inhale daily.     traMADol 50 MG tablet  Commonly known as:  ULTRAM  Take 50 mg by mouth every 6 (six) hours as needed for moderate pain.     vitamin E 400 UNIT capsule  Take 400 Units by mouth daily.         Duration of Discharge Encounter: Greater than 30 minutes including physician time.  Angelena Form PA-C 03/06/2014 8:38 AM  Personally seen and examined. Agree with above.  Marked improvement overnight after 1.7 L diuresis, IV Lasix 40. We will continue HCTZ 25 mg. If necessary, we can transition this to by mouth Lasix in the future. Salt restriction, fluid restriction.  Please check basic metabolic profile at return visit. Her creatinine was slightly elevated from baseline. 1.38 from 0.99.  Candee Furbish, MD

## 2014-03-06 NOTE — ED Provider Notes (Signed)
80 y.o female with copd presents with dyspnea and wheezing.    I performed a history and physical examination of [name] and discussed her management with Dr. Tamala Julian.  I agree with the history, physical, assessment, and plan of care, with the following exceptions: None  I was present for the following procedures: None Time Spent in Critical Care of the patient: None Time spent in discussions with the patient and family: [place], MD 03/06/14 [phone]

## 2014-03-06 NOTE — Progress Notes (Signed)
    Subjective:  Much better this am. No SOB or chest pain  Objective:  Vital Signs in the last 24 hours: Temp:  [97.6 F (36.4 C)-98.4 F (36.9 C)] 97.6 F (36.4 C) (08/20 0500) Pulse Rate:  [81-91] 81 (08/20 0631) Resp:  [12-20] 20 (08/20 0500) BP: (110-152)/(53-75) 124/61 mmHg (08/20 0631) SpO2:  [98 %-100 %] 100 % (08/20 0500) Weight:  [167 lb 8 oz (75.978 kg)-172 lb 6.4 oz (78.2 kg)] 167 lb 8 oz (75.978 kg) (08/20 0500)  Intake/Output from previous day:  Intake/Output Summary (Last 24 hours) at 03/06/14 0827 Last data filed at 03/06/14 1610  Gross per 24 hour  Intake    823 ml  Output   2600 ml  Net  -1777 ml    Physical Exam: General appearance: alert, cooperative and no distress Lungs: clear Heart: regular rate and rhythm and no rub Extremities: no edema   Rate: 78  Rhythm: normal sinus rhythm  Lab Results:  Recent Labs  03/05/14 0815  WBC 6.2  HGB 10.4*  PLT 191    Recent Labs  03/05/14 1445 03/06/14 0109  NA 143 142  K 4.4 3.6*  CL 108 104  CO2 20 19  GLUCOSE 122* 71  BUN 28* 29*  CREATININE 1.28* 1.38*    Recent Labs  03/05/14 1950 03/06/14 0109  TROPONINI <0.30 <0.30    Recent Labs  03/05/14 1445  INR 1.04    Imaging: Imaging results have been reviewed  Cardiac Studies:  Assessment/Plan:   Principal Problem:   Acute diastolic CHF (congestive heart failure) Active Problems:   S/P RCA DES 02/18/14   SOB (shortness of breath)   IDDM type 2   STEMI 02/18/14- RCA DES   CAD- RCA PCI '90s, RCA DES 02/18/14   Pericarditis-post MI (short course of steroids)   Hyperlipidemia with target LDL less than 70   Essential hypertension   COPD    PLAN: Better after 1.7 L diuresis. ? Mild fluid overload secondary to short course of steroids pot MI. Also on Brilinta. Should be OK to discharge on home meds this am.   Kerin Ransom PA-C Beeper [phone] 03/06/2014, 8:27 AM   Personally seen and examined. Agree with above. She would like  to go home. Candee Furbish, MD

## 2014-03-06 NOTE — Progress Notes (Signed)
All d/c inst ructions explained and given to pt by [facility] charge nurse.  D/c off floor at 1149 via w/c by vlolunteer service w/son at her side, to awaiting transport .  Karie Kirks, [place].

## 2014-03-12 ENCOUNTER — Other Ambulatory Visit: Payer: Self-pay | Admitting: *Deleted

## 2014-03-12 MED ORDER — TRAMADOL HCL 50 MG PO TABS: 50.0000 mg | ORAL_TABLET | Freq: Four times a day (QID) | ORAL | Status: DC | PRN

## 2014-03-13 ENCOUNTER — Emergency Department (HOSPITAL_COMMUNITY): Payer: Medicare HMO

## 2014-03-13 ENCOUNTER — Encounter (HOSPITAL_COMMUNITY): Payer: Self-pay | Admitting: Emergency Medicine

## 2014-03-13 ENCOUNTER — Inpatient Hospital Stay (HOSPITAL_COMMUNITY)
Admission: EM | Admit: 2014-03-13 | Discharge: 2014-03-17 | DRG: 292 | Disposition: A | Discharge status: Home / Self Care | Payer: Medicare HMO | Attending: Internal Medicine | Admitting: Internal Medicine

## 2014-03-13 DIAGNOSIS — E119 Type 2 diabetes mellitus without complications: Secondary | ICD-10-CM | POA: Diagnosis present

## 2014-03-13 DIAGNOSIS — I10 Essential (primary) hypertension: Secondary | ICD-10-CM | POA: Diagnosis present

## 2014-03-13 DIAGNOSIS — I252 Old myocardial infarction: Secondary | ICD-10-CM | POA: Diagnosis not present

## 2014-03-13 DIAGNOSIS — E785 Hyperlipidemia, unspecified: Secondary | ICD-10-CM | POA: Diagnosis present

## 2014-03-13 DIAGNOSIS — E1169 Type 2 diabetes mellitus with other specified complication: Secondary | ICD-10-CM | POA: Diagnosis present

## 2014-03-13 DIAGNOSIS — I319 Disease of pericardium, unspecified: Secondary | ICD-10-CM | POA: Diagnosis present

## 2014-03-13 DIAGNOSIS — I5032 Chronic diastolic (congestive) heart failure: Secondary | ICD-10-CM | POA: Diagnosis present

## 2014-03-13 DIAGNOSIS — I5033 Acute on chronic diastolic (congestive) heart failure: Principal | ICD-10-CM | POA: Diagnosis present

## 2014-03-13 DIAGNOSIS — R0600 Dyspnea, unspecified: Secondary | ICD-10-CM | POA: Diagnosis present

## 2014-03-13 DIAGNOSIS — E1159 Type 2 diabetes mellitus with other circulatory complications: Secondary | ICD-10-CM | POA: Diagnosis present

## 2014-03-13 DIAGNOSIS — I509 Heart failure, unspecified: Secondary | ICD-10-CM | POA: Diagnosis present

## 2014-03-13 DIAGNOSIS — I2510 Atherosclerotic heart disease of native coronary artery without angina pectoris: Secondary | ICD-10-CM | POA: Diagnosis present

## 2014-03-13 DIAGNOSIS — T502X5A Adverse effect of carbonic-anhydrase inhibitors, benzothiadiazides and other diuretics, initial encounter: Secondary | ICD-10-CM | POA: Diagnosis present

## 2014-03-13 DIAGNOSIS — E876 Hypokalemia: Secondary | ICD-10-CM | POA: Diagnosis present

## 2014-03-13 DIAGNOSIS — N179 Acute kidney failure, unspecified: Secondary | ICD-10-CM | POA: Diagnosis present

## 2014-03-13 DIAGNOSIS — Z9861 Coronary angioplasty status: Secondary | ICD-10-CM

## 2014-03-13 DIAGNOSIS — M545 Low back pain, unspecified: Secondary | ICD-10-CM | POA: Diagnosis present

## 2014-03-13 DIAGNOSIS — I440 Atrioventricular block, first degree: Secondary | ICD-10-CM | POA: Diagnosis present

## 2014-03-13 DIAGNOSIS — J449 Chronic obstructive pulmonary disease, unspecified: Secondary | ICD-10-CM | POA: Diagnosis present

## 2014-03-13 DIAGNOSIS — Z955 Presence of coronary angioplasty implant and graft: Secondary | ICD-10-CM

## 2014-03-13 DIAGNOSIS — Z794 Long term (current) use of insulin: Secondary | ICD-10-CM | POA: Diagnosis not present

## 2014-03-13 DIAGNOSIS — I5031 Acute diastolic (congestive) heart failure: Secondary | ICD-10-CM

## 2014-03-13 DIAGNOSIS — J4489 Other specified chronic obstructive pulmonary disease: Secondary | ICD-10-CM | POA: Diagnosis present

## 2014-03-13 LAB — PRO B NATRIURETIC PEPTIDE: Pro B Natriuretic peptide (BNP): 2091 pg/mL — ABNORMAL HIGH (ref 0–450)

## 2014-03-13 LAB — CBC
HEMATOCRIT: 32.9 % — AB (ref 36.0–46.0)
HEMOGLOBIN: 10.7 g/dL — AB (ref 12.0–15.0)
MCH: 26.6 pg (ref 26.0–34.0)
MCHC: 32.5 g/dL (ref 30.0–36.0)
MCV: 81.6 fL (ref 78.0–100.0)
Platelets: 249 10*3/uL (ref 150–400)
RBC: 4.03 MIL/uL (ref 3.87–5.11)
RDW: 16.5 % — AB (ref 11.5–15.5)
WBC: 4.7 10*3/uL (ref 4.0–10.5)

## 2014-03-13 LAB — GLUCOSE, CAPILLARY: Glucose-Capillary: 84 mg/dL (ref 70–99)

## 2014-03-13 LAB — BASIC METABOLIC PANEL
Anion gap: 14 (ref 5–15)
BUN: 20 mg/dL (ref 6–23)
CHLORIDE: 104 meq/L (ref 96–112)
CO2: 22 mEq/L (ref 19–32)
Calcium: 9.4 mg/dL (ref 8.4–10.5)
Creatinine, Ser: 1.16 mg/dL — ABNORMAL HIGH (ref 0.50–1.10)
GFR calc non Af Amer: 44 mL/min — ABNORMAL LOW (ref >90)
GFR, EST AFRICAN AMERICAN: 51 mL/min — AB (ref >90)
GLUCOSE: 125 mg/dL — AB (ref 70–99)
Potassium: 4.3 mEq/L (ref 3.7–5.3)
Sodium: 140 mEq/L (ref 137–147)

## 2014-03-13 LAB — I-STAT TROPONIN, ED: Troponin i, poc: 0.01 ng/mL (ref 0.00–0.08)

## 2014-03-13 IMAGING — CR DG CHEST 2V
2 series · 2 of 2 positions shown · non-contrast
Comparison: None.

CLINICAL DATA: COPD.  Diabetes.

EXAM:
CHEST  2 VIEW

[w chest pa]
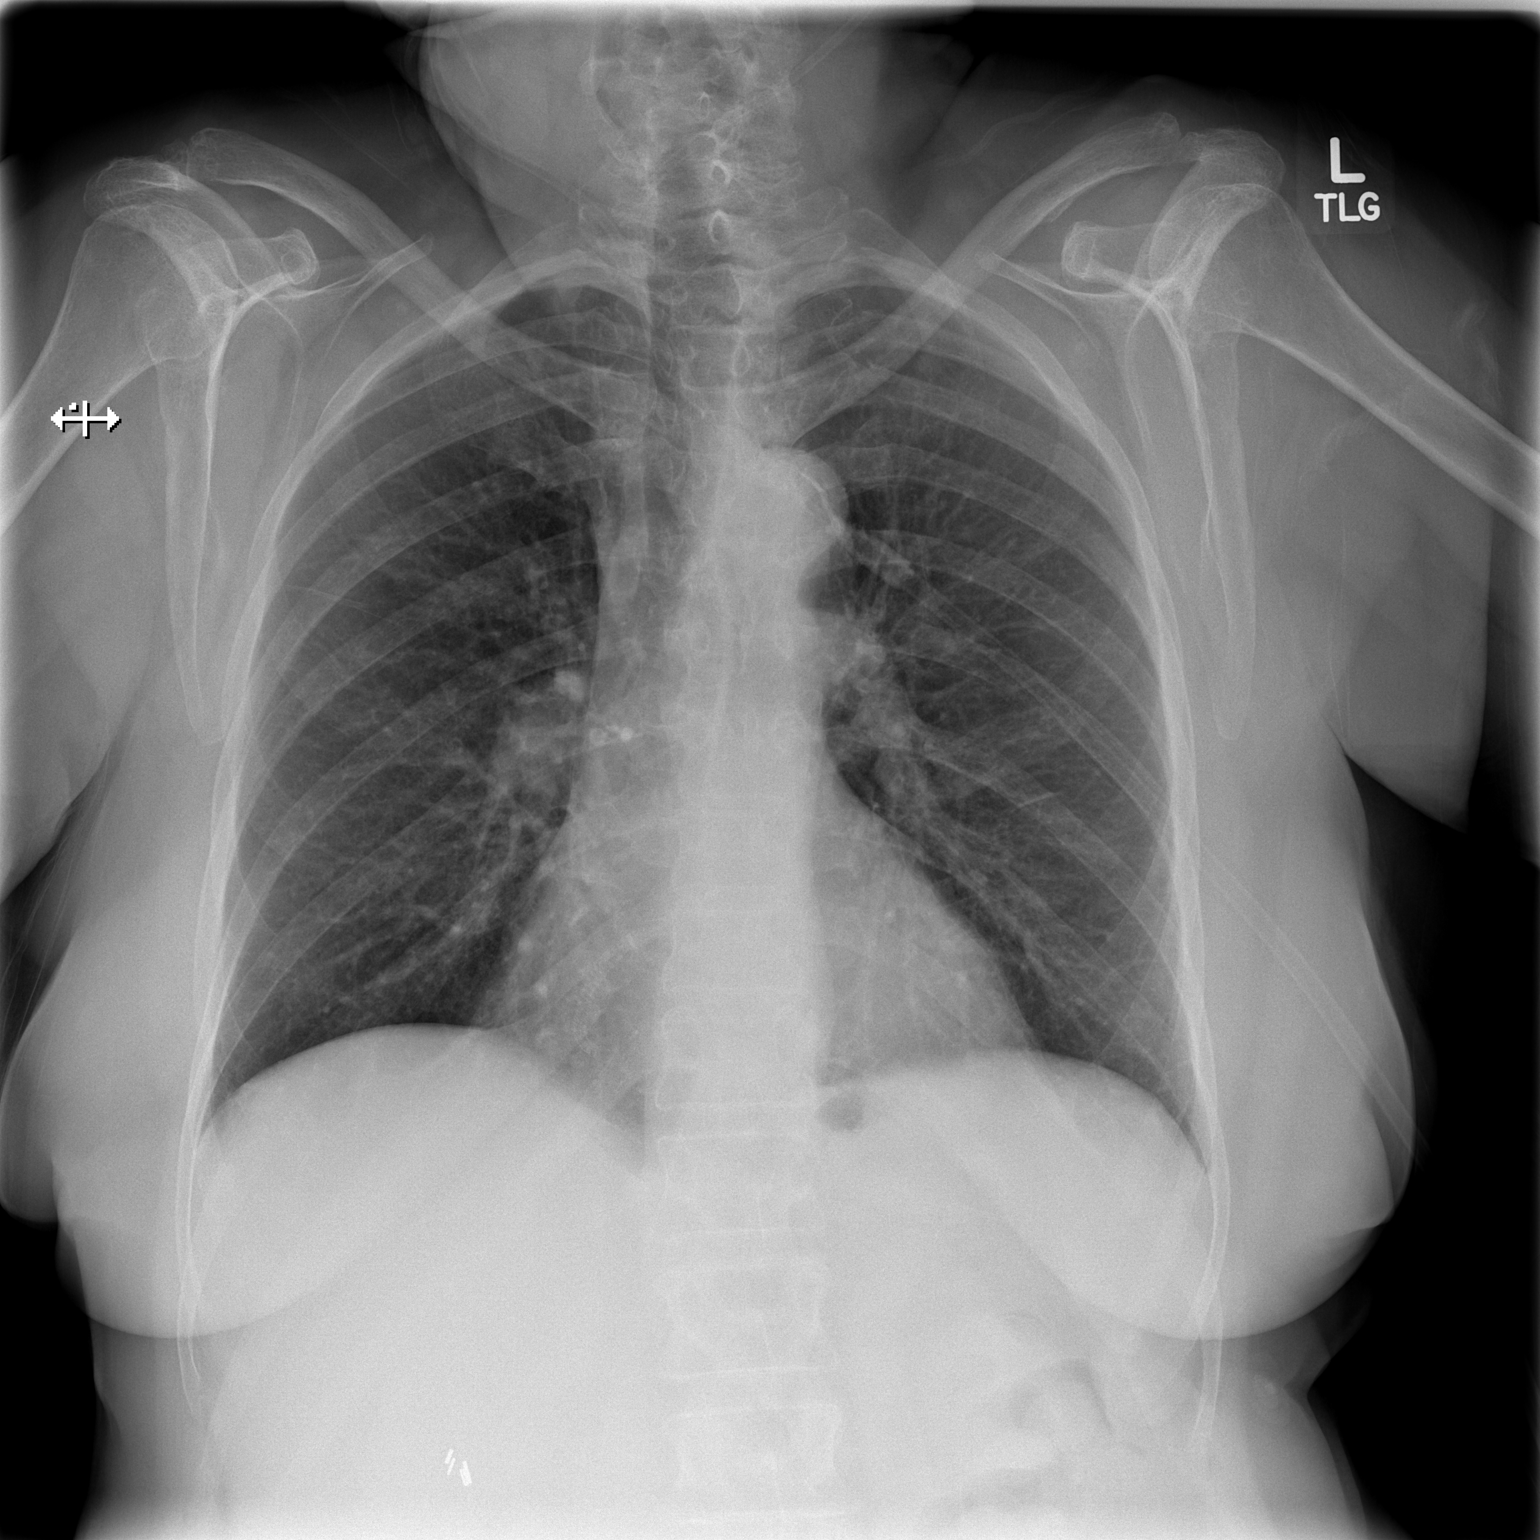

[w chest lat]
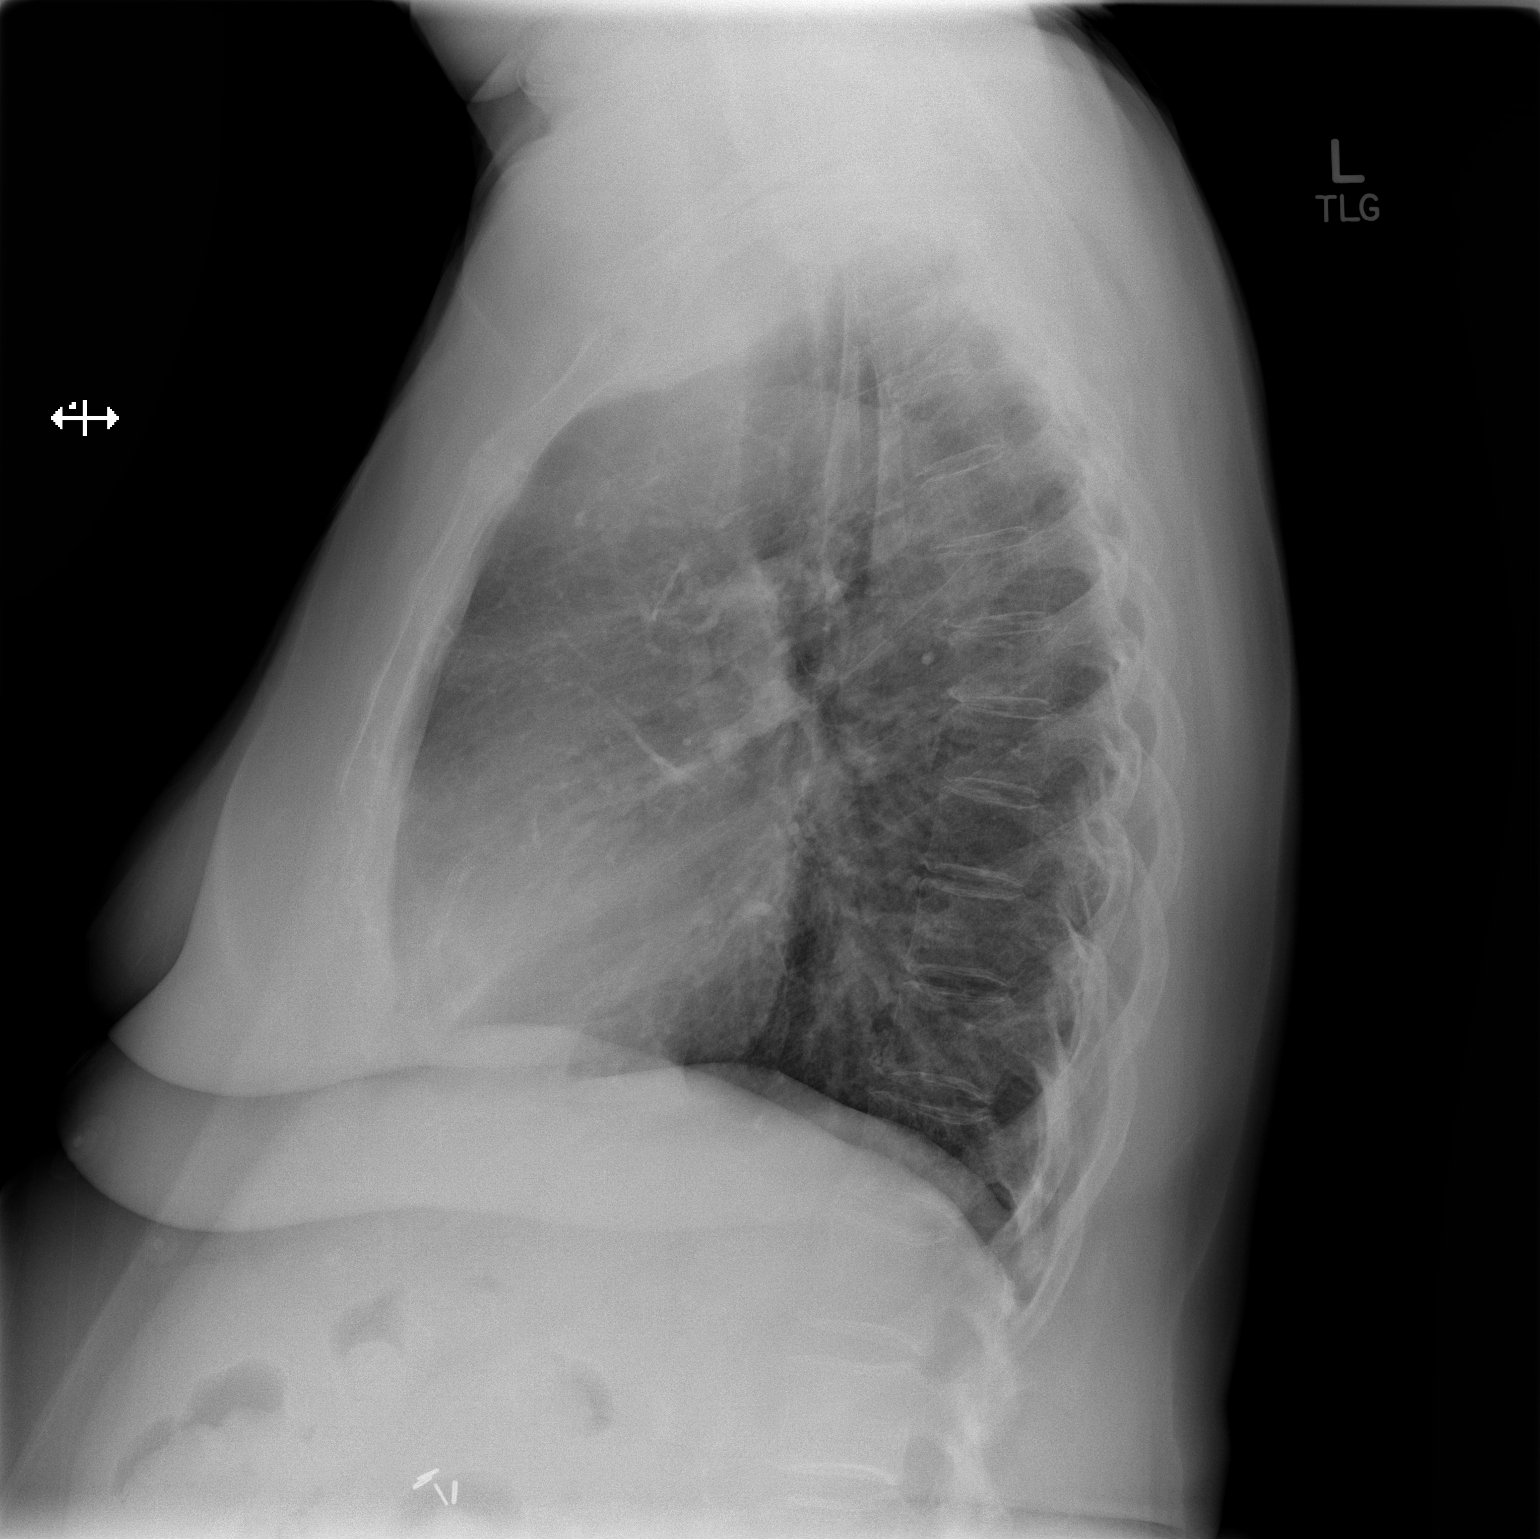

[2 of 2 positions shown; findings below may reference images not displayed]

FINDINGS: Cardiopericardial silhouette within normal limits. Mediastinal
contours normal. Trachea midline. No airspace disease or effusion.
Aortic arch atherosclerosis. Cholecystectomy clips are present in
the right upper quadrant.
IMPRESSION: No active cardiopulmonary disease.

## 2014-03-13 MED ORDER — FUROSEMIDE 10 MG/ML IJ SOLN
40.0000 mg | Freq: Two times a day (BID) | INTRAMUSCULAR | Status: DC
  Administered 2014-03-14 (×2): 40 mg via INTRAVENOUS
  Filled 2014-03-13 (×5): qty 4

## 2014-03-13 MED ORDER — INSULIN GLARGINE 100 UNIT/ML ~~LOC~~ SOLN
15.0000 [IU] | Freq: Every morning | SUBCUTANEOUS | Status: DC
  Administered 2014-03-14 – 2014-03-17 (×4): 15 [IU] via SUBCUTANEOUS
  Filled 2014-03-13 (×4): qty 0.15

## 2014-03-13 MED ORDER — OMEGA-3 FATTY ACIDS 1000 MG PO CAPS
2.0000 g | ORAL_CAPSULE | Freq: Every day | ORAL | Status: DC
  Filled 2014-03-13 (×2): qty 2

## 2014-03-13 MED ORDER — POTASSIUM CHLORIDE CRYS ER 20 MEQ PO TBCR
20.0000 meq | EXTENDED_RELEASE_TABLET | Freq: Every day | ORAL | Status: DC
  Administered 2014-03-14: 20 meq via ORAL
  Filled 2014-03-13 (×2): qty 1

## 2014-03-13 MED ORDER — ATORVASTATIN CALCIUM 80 MG PO TABS
80.0000 mg | ORAL_TABLET | Freq: Every day | ORAL | Status: DC
  Administered 2014-03-14 – 2014-03-16 (×3): 80 mg via ORAL
  Filled 2014-03-13 (×4): qty 1

## 2014-03-13 MED ORDER — ASPIRIN EC 81 MG PO TBEC
81.0000 mg | DELAYED_RELEASE_TABLET | Freq: Every day | ORAL | Status: DC
  Administered 2014-03-14 – 2014-03-17 (×4): 81 mg via ORAL
  Filled 2014-03-13 (×4): qty 1

## 2014-03-13 MED ORDER — METOPROLOL SUCCINATE ER 100 MG PO TB24
100.0000 mg | ORAL_TABLET | Freq: Every day | ORAL | Status: DC
  Administered 2014-03-14 – 2014-03-17 (×4): 100 mg via ORAL
  Filled 2014-03-13 (×4): qty 1

## 2014-03-13 MED ORDER — TICAGRELOR 90 MG PO TABS
90.0000 mg | ORAL_TABLET | Freq: Two times a day (BID) | ORAL | Status: DC
  Administered 2014-03-14 – 2014-03-17 (×8): 90 mg via ORAL
  Filled 2014-03-13 (×9): qty 1

## 2014-03-13 MED ORDER — FENOFIBRATE 160 MG PO TABS
160.0000 mg | ORAL_TABLET | Freq: Every day | ORAL | Status: DC
  Administered 2014-03-14 – 2014-03-17 (×4): 160 mg via ORAL
  Filled 2014-03-13 (×4): qty 1

## 2014-03-13 MED ORDER — VITAMIN E 180 MG (400 UNIT) PO CAPS
400.0000 [IU] | ORAL_CAPSULE | Freq: Every day | ORAL | Status: DC
  Administered 2014-03-14 – 2014-03-17 (×4): 400 [IU] via ORAL
  Filled 2014-03-13 (×4): qty 1

## 2014-03-13 MED ORDER — MOMETASONE FURO-FORMOTEROL FUM 100-5 MCG/ACT IN AERO
2.0000 | INHALATION_SPRAY | Freq: Two times a day (BID) | RESPIRATORY_TRACT | Status: DC
  Administered 2014-03-13 – 2014-03-17 (×7): 2 via RESPIRATORY_TRACT
  Filled 2014-03-13: qty 8.8

## 2014-03-13 MED ORDER — HEPARIN SODIUM (PORCINE) 5000 UNIT/ML IJ SOLN
5000.0000 [IU] | Freq: Three times a day (TID) | INTRAMUSCULAR | Status: DC
  Administered 2014-03-14 – 2014-03-17 (×11): 5000 [IU] via SUBCUTANEOUS
  Filled 2014-03-13 (×13): qty 1

## 2014-03-13 MED ORDER — FLUTICASONE PROPIONATE 50 MCG/ACT NA SUSP
1.0000 | Freq: Every day | NASAL | Status: DC
  Administered 2014-03-14 – 2014-03-17 (×4): 1 via NASAL
  Filled 2014-03-13 (×2): qty 16

## 2014-03-13 MED ORDER — ALBUTEROL SULFATE (2.5 MG/3ML) 0.083% IN NEBU
2.5000 mg | INHALATION_SOLUTION | Freq: Four times a day (QID) | RESPIRATORY_TRACT | Status: DC | PRN
  Administered 2014-03-14: 2.5 mg via RESPIRATORY_TRACT
  Filled 2014-03-13: qty 3

## 2014-03-13 MED ORDER — HYDROCHLOROTHIAZIDE 25 MG PO TABS
25.0000 mg | ORAL_TABLET | Freq: Every day | ORAL | Status: DC
  Administered 2014-03-14: 25 mg via ORAL
  Filled 2014-03-13: qty 1

## 2014-03-13 MED ORDER — TRAMADOL HCL 50 MG PO TABS
50.0000 mg | ORAL_TABLET | Freq: Four times a day (QID) | ORAL | Status: DC | PRN
  Administered 2014-03-14 – 2014-03-17 (×6): 50 mg via ORAL
  Filled 2014-03-13 (×7): qty 1

## 2014-03-13 MED ORDER — ENALAPRIL MALEATE 20 MG PO TABS
20.0000 mg | ORAL_TABLET | Freq: Two times a day (BID) | ORAL | Status: DC
  Administered 2014-03-14 (×3): 20 mg via ORAL
  Filled 2014-03-13 (×6): qty 1

## 2014-03-13 MED ORDER — ACETAMINOPHEN 325 MG PO TABS
650.0000 mg | ORAL_TABLET | Freq: Four times a day (QID) | ORAL | Status: DC | PRN
  Administered 2014-03-16: 650 mg via ORAL
  Filled 2014-03-13: qty 2

## 2014-03-13 MED ORDER — NITROGLYCERIN 0.4 MG SL SUBL: 0.4000 mg | SUBLINGUAL_TABLET | SUBLINGUAL | Status: DC | PRN

## 2014-03-13 MED ORDER — PANTOPRAZOLE SODIUM 40 MG PO TBEC
40.0000 mg | DELAYED_RELEASE_TABLET | Freq: Every day | ORAL | Status: DC
  Administered 2014-03-14 – 2014-03-17 (×4): 40 mg via ORAL
  Filled 2014-03-13 (×4): qty 1

## 2014-03-13 MED ORDER — INSULIN ASPART 100 UNIT/ML ~~LOC~~ SOLN
0.0000 [IU] | SUBCUTANEOUS | Status: DC
  Administered 2014-03-14 (×2): 1 [IU] via SUBCUTANEOUS

## 2014-03-13 MED ORDER — SODIUM CHLORIDE 0.9 % IJ SOLN
3.0000 mL | Freq: Two times a day (BID) | INTRAMUSCULAR | Status: DC
  Administered 2014-03-14 – 2014-03-17 (×8): 3 mL via INTRAVENOUS

## 2014-03-13 MED ORDER — SODIUM CHLORIDE 0.9 % IJ SOLN: 3.0000 mL | INTRAMUSCULAR | Status: DC | PRN

## 2014-03-13 MED ORDER — FUROSEMIDE 10 MG/ML IJ SOLN
40.0000 mg | Freq: Once | INTRAMUSCULAR | Status: AC
  Administered 2014-03-13: 40 mg via INTRAVENOUS
  Filled 2014-03-13: qty 4

## 2014-03-13 MED ORDER — TIOTROPIUM BROMIDE MONOHYDRATE 18 MCG IN CAPS
18.0000 ug | ORAL_CAPSULE | Freq: Every day | RESPIRATORY_TRACT | Status: DC
  Administered 2014-03-14 – 2014-03-17 (×4): 18 ug via RESPIRATORY_TRACT
  Filled 2014-03-13: qty 5

## 2014-03-13 MED ORDER — SODIUM CHLORIDE 0.9 % IV SOLN: 250.0000 mL | INTRAVENOUS | Status: DC | PRN

## 2014-03-13 NOTE — ED Provider Notes (Signed)
CSN: [id number]     Arrival date & time 03/13/14  1635 History   First MD Initiated Contact with Patient 03/13/14 2103     Chief Complaint  Patient presents with  . Shortness of Breath    The patient says she has been having SOB since 0200 this morning.  The patient said she waited for her sons to get out of work so they could bring her.     (Consider location/radiation/quality/duration/timing/severity/associated sxs/prior Treatment) HPI Comments: Patient presents to the ER for evaluation of shortness of breath. Patient reports that she recently had a heart attack, had a stent placed. She was readmitted to the hospital recently because of increasing shortness of breath. She is not sure what she was treated for. Patient reports that she had chest pain when she had a heart attack, is not currently having any chest pain. Shortness of breath began early this morning has progressively worsened through the course of today. She is short of breath at rest, worsened with movement. She has been using her albuterol, does help slightly. She is to have a cough or fever.  Patient is a 80 y.o. female presenting with shortness of breath.  Shortness of Breath Associated symptoms: no chest pain     Past Medical History  Diagnosis Date  . COPD (chronic obstructive pulmonary disease)   . CAD (coronary artery disease) 1990; 2015    Cardiac cath 1990 with Dr. Lia Foyer and [name] Residual CAD, non obstructive per 2015 cath  . Hyperlipidemia with target LDL less than 70   . Essential hypertension   . Diabetes mellitus type 2, insulin dependent   . AV block, 1st degree   . 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 Myocardial infarction   . Anginal pain   . Shortness of breath   .  Arthritis    Past Surgical History  Procedure Laterality Date  . Cholecystectomy    . Cataract extraction  2010  . Ptca  1990    PTCA of RCA  . Transthoracic echocardiogram  02/02/2012    mild LVH, EF 55-60%, Normal WM, Gr 1 DD; Mild MR  . Coronary angioplasty with stent placement  02/18/14    Promus DES to RCA   Family History  Problem Relation Age of Onset  . Heart attack Mother 21  . Cancer Father 75   History  Substance Use Topics  . Smoking status: Former Smoker -- 1.00 packs/day for 55 years    Types: Cigarettes    Quit date: 09/29/2006  . Smokeless tobacco: Never Used  . Alcohol Use: No   OB History   Grav Para Term Preterm Abortions TAB SAB Ect Mult Living                 Review of Systems  Respiratory: Positive for shortness of breath.   Cardiovascular: Negative for chest pain.  All other systems reviewed and are negative.     Allergies  Codeine sulfate and Morphine sulfate  Home Medications   Prior to Admission medications   Medication Sig Start Date End Date Taking? Authorizing Provider  acetaminophen (TYLENOL) 325 MG tablet Take 650 mg by mouth every 6 (six) hours as needed for mild pain.   Yes Historical  Provider, MD  albuterol (PROVENTIL) (2.5 MG/3ML) 0.083% nebulizer solution Take 2.5 mg by nebulization 4 (four) times daily as needed for wheezing or shortness of breath.   Yes Historical Provider, MD  aspirin EC 81 MG tablet Take 81 mg by mouth daily.   Yes Historical Provider, MD  enalapril (VASOTEC) 20 MG tablet Take 20 mg by mouth 2 (two) times daily.   Yes Historical Provider, MD  fenofibrate micronized (LOFIBRA) 134 MG capsule Take 134 mg by mouth daily before breakfast.   Yes Historical Provider, MD  fish oil-omega-3 fatty acids 1000 MG capsule Take 2 g by mouth daily.     Yes Historical Provider, MD  fluticasone (FLONASE) 50 MCG/ACT nasal spray Place 1 spray into both nostrils daily.   Yes Historical Provider, MD  Fluticasone-Salmeterol (ADVAIR)  250-50 MCG/DOSE AEPB Inhale 1 puff into the lungs 2 (two) times daily.   Yes Historical Provider, MD  folic acid (FOLVITE) 761 MCG tablet Take 400 mcg by mouth daily.     Yes Historical Provider, MD  glimepiride (AMARYL) 2 MG tablet Take 2 mg by mouth 2 (two) times daily.   Yes Historical Provider, MD  hydrochlorothiazide (HYDRODIURIL) 25 MG tablet Take 25 mg by mouth daily.   Yes Historical Provider, MD  Insulin Glargine (LANTUS SOLOSTAR) 100 UNIT/ML Solostar Pen Inject 15 Units into the skin every morning.    Yes Historical Provider, MD  metFORMIN (GLUCOPHAGE) 1000 MG tablet Take 1,000 mg by mouth 2 (two) times daily with a meal.   Yes Historical Provider, MD  metoprolol succinate (TOPROL-XL) 100 MG 24 hr tablet Take 100 mg by mouth daily. Take with or immediately following a meal.   Yes Historical Provider, MD  nitroGLYCERIN (NITROSTAT) 0.4 MG SL tablet Place 0.4 mg under the tongue every 5 (five) minutes as needed for chest pain.   Yes Historical Provider, MD  pantoprazole (PROTONIX) 40 MG tablet Take 40 mg by mouth daily.   Yes Historical Provider, MD  potassium chloride SA (K-DUR,KLOR-CON) 20 MEQ tablet Take 20 mEq by mouth daily.   Yes Historical Provider, MD  rosuvastatin (CRESTOR) 40 MG tablet Take 40 mg by mouth daily.   Yes Historical Provider, MD  sodium chloride (OCEAN) 0.65 % SOLN nasal spray Place 1 spray into both nostrils as needed for congestion.   Yes Historical Provider, MD  ticagrelor (BRILINTA) 90 MG TABS tablet Take 90 mg by mouth 2 (two) times daily.   Yes Historical Provider, MD  tiotropium (SPIRIVA) 18 MCG inhalation capsule Place 18 mcg into inhaler and inhale daily.   Yes Historical Provider, MD  traMADol (ULTRAM) 50 MG tablet Take 50 mg by mouth every 6 (six) hours as needed.   Yes Historical Provider, MD  vitamin E 400 UNIT capsule Take 400 Units by mouth daily.     Yes Historical Provider, MD   BP 146/61  Pulse 83  Temp(Src) 97.8 F (36.6 C) (Oral)  Resp 21  SpO2  97% Physical Exam  Constitutional: She is oriented to person, place, and time. She appears well-developed and well-nourished. No distress.  HENT:  Head: Normocephalic and atraumatic.  Right Ear: Hearing normal.  Left Ear: Hearing normal.  Nose: Nose normal.  Mouth/Throat: Oropharynx is clear and moist and mucous membranes are normal.  Eyes: Conjunctivae and EOM are normal. Pupils are equal, round, and reactive to light.  Neck: Normal range of motion. Neck supple.  Cardiovascular: Regular rhythm, S1 normal and S2 normal.  Exam reveals no gallop and  no friction rub.   No murmur heard. Pulmonary/Chest: Effort normal and breath sounds normal. No respiratory distress. She exhibits no tenderness.  Abdominal: Soft. Normal appearance and bowel sounds are normal. There is no hepatosplenomegaly. There is no tenderness. There is no rebound, no guarding, no tenderness at McBurney's point and negative Murphy's sign. No hernia.  Musculoskeletal: Normal range of motion.  Neurological: She is alert and oriented to person, place, and time. She has normal strength. No cranial nerve deficit or sensory deficit. Coordination normal. GCS eye subscore is 4. GCS verbal subscore is 5. GCS motor subscore is 6.  Skin: Skin is warm, dry and intact. No rash noted. No cyanosis.  Psychiatric: She has a normal mood and affect. Her speech is normal and behavior is normal. Thought content normal.    ED Course  Procedures (including critical care time) Labs Review Labs Reviewed  BASIC METABOLIC PANEL - Abnormal; Notable for the following:    Glucose, Bld 125 (*)    Creatinine, Ser 1.16 (*)    GFR calc non Af Amer 44 (*)    GFR calc Af Amer 51 (*)    All other components within normal limits  CBC - Abnormal; Notable for the following:    Hemoglobin 10.7 (*)    HCT 32.9 (*)    RDW 16.5 (*)    All other components within normal limits  PRO B NATRIURETIC PEPTIDE - Abnormal; Notable for the following:    Pro B  Natriuretic peptide (BNP) 2091.0 (*)    All other components within normal limits  I-STAT TROPOININ, ED    Imaging Review Dg Chest 2 View (if Patient Has Fever And/or Copd)  03/13/2014   CLINICAL DATA:  COPD.  Diabetes.  EXAM: CHEST  2 VIEW  COMPARISON:  None.  FINDINGS: Cardiopericardial silhouette within normal limits. Mediastinal contours normal. Trachea midline. No airspace disease or effusion. Aortic arch atherosclerosis. Cholecystectomy clips are present in the right upper quadrant.  IMPRESSION: No active cardiopulmonary disease.   Electronically Signed   By: Dereck Ligas M.D.   On: 03/13/2014 18:07     EKG Interpretation   Date/Time:  Thursday March 13 2014 16:45:46 EDT Ventricular Rate:  87 PR Interval:  210 QRS Duration: 84 QT Interval:  368 QTC Calculation: 442 R Axis:   -29 Text Interpretation:  Sinus rhythm with 1st degree A-V block Low voltage  QRS Borderline ECG No significant change since last tracing Confirmed by  Armanie Martine  MD, [place] [phone]) on 03/13/2014 9:16:14 PM      MDM   Final diagnoses:  None   diastolic heart failure  Patient presents to the ER for evaluation of shortness of breath. Reviewing the patient's records reveals that she has a complicated recent past medical history. Patient had ST elevation MI secondary to occluded right coronary artery earlier in the month. She had repeat hospitalization this past week for diastolic heart failure. Patient returns once again with shortness of breath. BNP is elevated above value seen with her most recent hospitalization for diastolic heart failure. Patient is not experiencing any chest pain. EKG is unchanged from previous. Troponin negative. Chest x-ray does not show any acute abnormality.  Patient discussed with Doctor Radford Pax, on call for cardiology. The patient likely suffering from diastolic heart failure, recommends a medicine admission. Patient does have a history of COPD as well, will require continued  bronchodilator therapy, although there does not appear to be any significant bronchospasm currently.   Orpah Greek, MD  03/13/14 2144 

## 2014-03-13 NOTE — ED Notes (Signed)
The patient says she has been having SOB since 0200 this morning.  The patient said she waited for her sons to get out of work so they could bring her.  She denies any other symptoms other than intermittent headache.  The patient is here to be evaluated.

## 2014-03-13 NOTE — ED Notes (Signed)
Dr. Newton at bedside. 

## 2014-03-13 NOTE — H&P (Signed)
Hospitalist Admission History and Physical  Patient name: Michelle [name] Medical record number: [id number] Date of birth: 10-05-1932 Age: 80 y.o. Gender: female  Primary Care Provider: Pcp Not In System  Chief Complaint: dyspnea History of Present Illness:This is a 80 y.o. year old female with significant past medical history of CAD s/p recent STEMI 02/18/14 s/p stenting, recent pericardial effusion with recent course of steroids, diastolic CHF, COPD, IDDM presenting with dyspnea. Pt has had a fairly protracted course of medical care over the past 1-2 months including STEMI 02/18/14 s/p stenting with noted secondary pericardial fluid and readmission for dyspnea 8/19-8/20 for what was felt to be volume overload in setting of normal ECHO (no residual pericardial fluid). Pt was given IV lasix with reported 1.7 L fluid diuresis. Pt states that she was symptomatically improved for only 1 day, otherwise she has had persistent dyspnea at rest as well as with exertion. Has been compliant with meds. Denies any excess salt intake. No CP. No wheezing or cough. + LE swelling.  In the ER, hemodynamically stable. Afebrile. Satting >97% on RA. CBC and BMET essentially WNL apart from Cr. 1.16 (near baseline). Trop neg x1. EKG WNL. ProBNP 2100 (1400 from last admission). Cards consulted. Asking for medical admission.   8/20 discharge wt: 76 kg Admission wt: PENDING  Assessment and Plan: Michelle [name] is a 80 y.o. year old female presenting with dyspnea   Active Problems:   Diastolic CHF   Dyspnea   1-Dyspnea  -Seems consistent with volume overload  -No clinical signs of COPD flare -f/u cards recs  -strict Is and Os, daily weights, low salt diet  -IV lasix   2-CAD/diastolic CHF/pericardial effusion  -diurese as above -f/u cards recs  -continue outpt regimen  -cycle CEs  -may need repeat 2D ECHO, though no clinical signs of pericardial effusion-f/u cards recs   3-COPD  -No wheezing, cough today   -able to speak in full sentences  -continue outpt regimen  4-IDDM -SSI, A1C   FEN/GI: heart healthy/carb modified, low salt. PPI  Prophylaxis: heparin  Disposition: pending further evaluation  Code Status:Full Code    Patient Active Problem List   Diagnosis Date Noted  . Diastolic CHF 69/62/9528  . Dyspnea 03/13/2014  . Pericarditis-post MI (short course of steroids) 03/06/2014  . SOB (shortness of breath) 03/05/2014  . CAD- RCA PCI '90s, RCA DES 02/18/14 03/05/2014  . Acute diastolic CHF (congestive heart failure) 03/05/2014  . STEMI 02/18/14- RCA DES 02/21/2014  . S/P RCA DES 02/18/14 02/21/2014  . Hematoma, rt forearm 02/21/2014  . Acute coronary syndrome 02/18/2014  . Unstable angina 02/18/2014  . Acute myocardial infarction of other inferior wall, initial episode of care 02/18/2014  . Back pain, lumbosacral 10/10/2012  . IDDM type 2 03/26/2007  . Hyperlipidemia with target LDL less than 70 03/26/2007  . Essential hypertension 04/21/2006  . Atherosclerotic heart disease of native coronary artery with unstable angina pectoris 04/21/2006  . COPD 04/21/2006   Past Medical History: Past Medical History  Diagnosis Date  . COPD (chronic obstructive pulmonary disease)   . CAD (coronary artery disease) 1990; 2015    Cardiac cath 1990 with Dr. Lia Foyer and [name] Residual CAD, non obstructive per 2015 cath  . Hyperlipidemia with target LDL less than 70   . Essential hypertension   . Diabetes  mellitus type 2, insulin dependent   . AV block, 1st degree   . 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 Myocardial infarction   . Anginal pain   . Shortness of breath   . Arthritis     Past Surgical History: Past Surgical History  Procedure Laterality Date   . Cholecystectomy    . Cataract extraction  2010  . Ptca  1990    PTCA of RCA  . Transthoracic echocardiogram  02/02/2012    mild LVH, EF 55-60%, Normal WM, Gr 1 DD; Mild MR  . Coronary angioplasty with stent placement  02/18/14    Promus DES to RCA    Social History: History   Social History  . Marital Status: Married    Spouse Name: N/A    Number of Children: 2  . Years of Education: N/A   Occupational History  . Retired    Social History Main Topics  . Smoking status: Former Smoker -- 1.00 packs/day for 55 years    Types: Cigarettes    Quit date: 09/29/2006  . Smokeless tobacco: Never Used  . Alcohol Use: No  . Drug Use: No  . Sexual Activity: None   Other Topics Concern  . None   Social History Narrative  . None    Family History: Family History  Problem Relation Age of Onset  . Heart attack Mother 24  . Cancer Father 70    Allergies: Allergies  Allergen Reactions  . Codeine Sulfate Nausea Only  . Morphine Sulfate Nausea Only    Current Facility-Administered Medications  Medication Dose Route Frequency Provider Last Rate Last Dose  . 0.9 %  sodium chloride infusion  250 mL Intravenous PRN Shanda Howells, MD      . acetaminophen (TYLENOL) tablet 650 mg  650 mg Oral Q6H PRN Shanda Howells, MD      . albuterol (PROVENTIL) (2.5 MG/3ML) 0.083% nebulizer solution 2.5 mg  2.5 mg Nebulization QID PRN Shanda Howells, MD      . Derrill Memo ON 03/14/2014] aspirin EC tablet 81 mg  81 mg Oral Daily Shanda Howells, MD      . Derrill Memo ON 03/14/2014] atorvastatin (LIPITOR) tablet 80 mg  80 mg Oral q1800 Shanda Howells, MD      . enalapril (VASOTEC) tablet 20 mg  20 mg Oral BID Shanda Howells, MD      . Derrill Memo ON 03/14/2014] fenofibrate tablet 160 mg  160 mg Oral Daily Shanda Howells, MD      . Derrill Memo ON 03/14/2014] fish oil-omega-3 fatty acids capsule 2 g  2 g Oral Daily Shanda Howells, MD      . Derrill Memo ON 03/14/2014] fluticasone (FLONASE) 50 MCG/ACT nasal spray 1 spray  1 spray Each  Nare Daily Shanda Howells, MD      . heparin injection 5,000 Units  5,000 Units Subcutaneous 3 times per day Shanda Howells, MD      . Derrill Memo ON 03/14/2014] hydrochlorothiazide (HYDRODIURIL) tablet 25 mg  25 mg Oral Daily Shanda Howells, MD      . Derrill Memo ON 03/14/2014] insulin aspart (novoLOG) injection 0-9 Units  0-9 Units Subcutaneous 6 times per day Shanda Howells, MD      . Derrill Memo ON 03/14/2014] Insulin Glargine (LANTUS) Solostar Pen 15 Units  15 Units Subcutaneous q morning - 10a Shanda Howells, MD      . Derrill Memo ON 03/14/2014] metoprolol succinate (TOPROL-XL)  24 hr tablet 100 mg  100 mg Oral Daily Shanda Howells, MD      . mometasone-formoterol [facility]) 100-5 MCG/ACT inhaler 2 puff  2 puff Inhalation BID Shanda Howells, MD      . nitroGLYCERIN (NITROSTAT) SL tablet 0.4 mg  0.4 mg Sublingual Q5 min PRN Shanda Howells, MD      . Derrill Memo ON 03/14/2014] pantoprazole (PROTONIX) EC tablet 40 mg  40 mg Oral Daily Shanda Howells, MD      . Derrill Memo ON 03/14/2014] potassium chloride SA (K-DUR,KLOR-CON) CR tablet 20 mEq  20 mEq Oral Daily Shanda Howells, MD      . sodium chloride 0.9 % injection 3 mL  3 mL Intravenous Q12H Shanda Howells, MD      . sodium chloride 0.9 % injection 3 mL  3 mL Intravenous PRN Shanda Howells, MD      . ticagrelor [facility]) tablet 90 mg  90 mg Oral BID Shanda Howells, MD      . Derrill Memo ON 03/14/2014] tiotropium [facility]) inhalation capsule 18 mcg  18 mcg Inhalation Daily Shanda Howells, MD      . traMADol [facility]) tablet 50 mg  50 mg Oral Q6H PRN Shanda Howells, MD      . Derrill Memo ON 03/14/2014] vitamin E capsule 400 Units  400 Units Oral Daily Shanda Howells, MD       Current Outpatient Prescriptions  Medication Sig Dispense Refill  . acetaminophen (TYLENOL) 325 MG tablet Take 650 mg by mouth every 6 (six) hours as needed for mild pain.      Marland Kitchen albuterol (PROVENTIL) (2.5 MG/3ML) 0.083% nebulizer solution Take 2.5 mg by nebulization 4 (four) times daily as needed for wheezing or shortness of breath.       Marland Kitchen aspirin EC 81 MG tablet Take 81 mg by mouth daily.      . enalapril (VASOTEC) 20 MG tablet Take 20 mg by mouth 2 (two) times daily.      . fenofibrate micronized (LOFIBRA) 134 MG capsule Take 134 mg by mouth daily before breakfast.      . fish oil-omega-3 fatty acids 1000 MG capsule Take 2 g by mouth daily.        . fluticasone (FLONASE) 50 MCG/ACT nasal spray Place 1 spray into both nostrils daily.      . Fluticasone-Salmeterol (ADVAIR) 250-50 MCG/DOSE AEPB Inhale 1 puff into the lungs 2 (two) times daily.      . folic acid (FOLVITE) 008 MCG tablet Take 400 mcg by mouth daily.        Marland Kitchen glimepiride (AMARYL) 2 MG tablet Take 2 mg by mouth 2 (two) times daily.      . hydrochlorothiazide (HYDRODIURIL) 25 MG tablet Take 25 mg by mouth daily.      . Insulin Glargine (LANTUS SOLOSTAR) 100 UNIT/ML Solostar Pen Inject 15 Units into the skin every morning.       . metFORMIN (GLUCOPHAGE) 1000 MG tablet Take 1,000 mg by mouth 2 (two) times daily with a meal.      . metoprolol succinate (TOPROL-XL) 100 MG 24 hr tablet Take 100 mg by mouth daily. Take with or immediately following a meal.      . nitroGLYCERIN (NITROSTAT) 0.4 MG SL tablet Place 0.4 mg under the tongue every 5 (five) minutes as needed for chest pain.      . pantoprazole (PROTONIX) 40 MG tablet Take 40 mg by mouth daily.      . potassium chloride SA (K-DUR,KLOR-CON) 20 MEQ  tablet Take 20 mEq by mouth daily.      . rosuvastatin (CRESTOR) 40 MG tablet Take 40 mg by mouth daily.      . sodium chloride (OCEAN) 0.65 % SOLN nasal spray Place 1 spray into both nostrils as needed for congestion.      . ticagrelor (BRILINTA) 90 MG TABS tablet Take 90 mg by mouth 2 (two) times daily.      Marland Kitchen tiotropium (SPIRIVA) 18 MCG inhalation capsule Place 18 mcg into inhaler and inhale daily.      . traMADol (ULTRAM) 50 MG tablet Take 50 mg by mouth every 6 (six) hours as needed.      . vitamin E 400 UNIT capsule Take 400 Units by mouth daily.         Review Of  Systems: 12 point ROS negative except as noted above in HPI.  Physical Exam: Filed Vitals:   03/13/14 2230  BP: 147/98  Pulse: 83  Temp:   Resp: 16    General: alert and cooperative HEENT: PERRLA and extra ocular movement intact Heart: S1, S2 normal, no murmur, rub or gallop, regular rate and rhythm Lungs: clear to auscultation, no wheezes or rales and unlabored breathing Abdomen: abdomen is soft without significant tenderness, masses, organomegaly or guarding Extremities: edema in LEs bilaterally, trace Skin:no rashes, no ecchymoses Neurology: normal without focal findings  Labs and Imaging: Lab Results  Component Value Date/Time   NA 140 03/13/2014  4:54 PM   K 4.3 03/13/2014  4:54 PM   CL 104 03/13/2014  4:54 PM   CO2 22 03/13/2014  4:54 PM   BUN 20 03/13/2014  4:54 PM   CREATININE 1.16* 03/13/2014  4:54 PM   GLUCOSE 125* 03/13/2014  4:54 PM   GLUCOSE 124* 07/28/2006 12:02 PM   Lab Results  Component Value Date   WBC 4.7 03/13/2014   HGB 10.7* 03/13/2014   HCT 32.9* 03/13/2014   MCV 81.6 03/13/2014   PLT 249 03/13/2014    Dg Chest 2 View (if Patient Has Fever And/or Copd)  03/13/2014   CLINICAL DATA:  COPD.  Diabetes.  EXAM: CHEST  2 VIEW  COMPARISON:  None.  FINDINGS: Cardiopericardial silhouette within normal limits. Mediastinal contours normal. Trachea midline. No airspace disease or effusion. Aortic arch atherosclerosis. Cholecystectomy clips are present in the right upper quadrant.  IMPRESSION: No active cardiopulmonary disease.   Electronically Signed   By: Dereck Ligas M.D.   On: 03/13/2014 18:07           Shanda Howells MD  Pager: [phone]

## 2014-03-14 ENCOUNTER — Encounter (HOSPITAL_COMMUNITY): Payer: Self-pay | Admitting: *Deleted

## 2014-03-14 DIAGNOSIS — E119 Type 2 diabetes mellitus without complications: Secondary | ICD-10-CM

## 2014-03-14 DIAGNOSIS — I509 Heart failure, unspecified: Secondary | ICD-10-CM

## 2014-03-14 DIAGNOSIS — Z794 Long term (current) use of insulin: Secondary | ICD-10-CM

## 2014-03-14 DIAGNOSIS — I5031 Acute diastolic (congestive) heart failure: Secondary | ICD-10-CM

## 2014-03-14 DIAGNOSIS — I2510 Atherosclerotic heart disease of native coronary artery without angina pectoris: Secondary | ICD-10-CM

## 2014-03-14 LAB — GLUCOSE, CAPILLARY
Glucose-Capillary: 128 mg/dL — ABNORMAL HIGH (ref 70–99)
Glucose-Capillary: 126 mg/dL — ABNORMAL HIGH (ref 70–99)
Glucose-Capillary: 149 mg/dL — ABNORMAL HIGH (ref 70–99)
Glucose-Capillary: 170 mg/dL — ABNORMAL HIGH (ref 70–99)
Glucose-Capillary: 133 mg/dL — ABNORMAL HIGH (ref 70–99)

## 2014-03-14 LAB — COMPREHENSIVE METABOLIC PANEL
ALT: 22 U/L (ref 0–35)
ANION GAP: 17 — AB (ref 5–15)
AST: 24 U/L (ref 0–37)
Albumin: 3.7 g/dL (ref 3.5–5.2)
Alkaline Phosphatase: 56 U/L (ref 39–117)
BILIRUBIN TOTAL: 0.3 mg/dL (ref 0.3–1.2)
BUN: 20 mg/dL (ref 6–23)
CO2: 22 mEq/L (ref 19–32)
Calcium: 9.6 mg/dL (ref 8.4–10.5)
Chloride: 101 mEq/L (ref 96–112)
Creatinine, Ser: 1.04 mg/dL (ref 0.50–1.10)
GFR calc Af Amer: 58 mL/min — ABNORMAL LOW (ref >90)
GFR, EST NON AFRICAN AMERICAN: 50 mL/min — AB (ref >90)
Glucose, Bld: 119 mg/dL — ABNORMAL HIGH (ref 70–99)
Potassium: 3.3 mEq/L — ABNORMAL LOW (ref 3.7–5.3)
Sodium: 140 mEq/L (ref 137–147)
TOTAL PROTEIN: 7.1 g/dL (ref 6.0–8.3)

## 2014-03-14 LAB — CBC WITH DIFFERENTIAL/PLATELET
BASOS ABS: 0 10*3/uL (ref 0.0–0.1)
BASOS PCT: 0 % (ref 0–1)
EOS ABS: 0.1 10*3/uL (ref 0.0–0.7)
Eosinophils Relative: 2 % (ref 0–5)
HCT: 34.5 % — ABNORMAL LOW (ref 36.0–46.0)
Hemoglobin: 11.3 g/dL — ABNORMAL LOW (ref 12.0–15.0)
Lymphocytes Relative: 31 % (ref 12–46)
Lymphs Abs: 1.7 10*3/uL (ref 0.7–4.0)
MCH: 26.5 pg (ref 26.0–34.0)
MCHC: 32.8 g/dL (ref 30.0–36.0)
MCV: 81 fL (ref 78.0–100.0)
Monocytes Absolute: 0.5 10*3/uL (ref 0.1–1.0)
Monocytes Relative: 9 % (ref 3–12)
NEUTROS ABS: 3 10*3/uL (ref 1.7–7.7)
NEUTROS PCT: 58 % (ref 43–77)
PLATELETS: 261 10*3/uL (ref 150–400)
RBC: 4.26 MIL/uL (ref 3.87–5.11)
RDW: 16.4 % — AB (ref 11.5–15.5)
WBC: 5.3 10*3/uL (ref 4.0–10.5)

## 2014-03-14 LAB — TROPONIN I: Troponin I: <0.3 ng/mL (ref <0.30)

## 2014-03-14 LAB — HEMOGLOBIN A1C
HEMOGLOBIN A1C: 6.4 % — AB (ref <5.7)
Mean Plasma Glucose: 137 mg/dL — ABNORMAL HIGH (ref <117)

## 2014-03-14 MED ORDER — TRAZODONE HCL 50 MG PO TABS
50.0000 mg | ORAL_TABLET | Freq: Every evening | ORAL | Status: DC | PRN
  Administered 2014-03-15 – 2014-03-16 (×2): 50 mg via ORAL
  Filled 2014-03-14 (×3): qty 1

## 2014-03-14 MED ORDER — INSULIN ASPART 100 UNIT/ML ~~LOC~~ SOLN
0.0000 [IU] | Freq: Three times a day (TID) | SUBCUTANEOUS | Status: DC
  Administered 2014-03-14: 2 [IU] via SUBCUTANEOUS
  Administered 2014-03-15: 1 [IU] via SUBCUTANEOUS
  Administered 2014-03-15: 2 [IU] via SUBCUTANEOUS
  Administered 2014-03-15 – 2014-03-16 (×2): 1 [IU] via SUBCUTANEOUS
  Administered 2014-03-16 (×2): 2 [IU] via SUBCUTANEOUS
  Administered 2014-03-17: 1 [IU] via SUBCUTANEOUS

## 2014-03-14 MED ORDER — OMEGA-3-ACID ETHYL ESTERS 1 G PO CAPS
2.0000 g | ORAL_CAPSULE | Freq: Two times a day (BID) | ORAL | Status: DC
  Administered 2014-03-14 – 2014-03-17 (×7): 2 g via ORAL
  Filled 2014-03-14 (×8): qty 2

## 2014-03-14 MED ORDER — INSULIN ASPART 100 UNIT/ML ~~LOC~~ SOLN: 0.0000 [IU] | Freq: Every day | SUBCUTANEOUS | Status: DC

## 2014-03-14 NOTE — Care Management Note (Addendum)
    Page 1 of 2   03/17/2014     2:35:33 PM CARE MANAGEMENT NOTE 03/17/2014  Patient:  Michelle [name],Michelle [name]   Account Number:  [id number]  Date Initiated:  03/14/2014  Documentation initiated by:  [facility]  Subjective/Objective Assessment:   80 y.o. year old female with PMHx of CAD s/p recent STEMI 02/18/14 s/p stenting, recent pericardial effusion with recent course of steroids, diastolic CHF, COPD, IDDM presenting with dyspnea.//Home with son.     Action/Plan:   1.  Admit to tele bed  2.  Cycle cardiac enzymes  3.  2D echo  4.  Daily BMET  5.  IV lasix//Resume HH services, establish PCP   Anticipated DC Date:  03/16/2014   Anticipated DC Plan:  [place]  PCP issues  CM consult      [facility] Choice  Resumption Of Svcs/PTA Provider   Choice offered to / List presented to:  C-1 Patient        [place] arranged  HH-1 RN  [place] [facility]      [place].   Status of service:  Completed, signed off Medicare Important Message given?  YES (If response is "NO", the following Medicare IM given date fields will be blank) Date Medicare IM given:  03/17/2014 Medicare IM given by:  [facility] Date Additional Medicare IM given:   Additional Medicare IM given by:    Discharge Disposition:    Per UR Regulation:  Reviewed for med. necessity/level of care/duration of stay  If discussed at [place] of Stay Meetings, dates discussed:   03/18/2014    Comments:  03/14/14 [place], RN, BSN, NCM [phone] Pt currently active with [facility] for PT services; please add RN for disease management.  Resumption of care requested.  Janae Sauce, RN of [facility] notified.  No DME needs identified at this time. No PCP listed for pt.  Pt sates that she went to [place] at [place] but her PCP left the company and she can not get an appointment until December.  Willing to return to office if timely appt  can be made.

## 2014-03-14 NOTE — Consult Note (Signed)
CARDIOLOGY CONSULT NOTE   Patient ID: Michelle [name] MRN: [id number] DOB/AGE: January 21, 1933 80 y.o.  Admit date: 03/13/2014  Primary Physician   Pcp Not In System Primary Cardiologist   Dr. Julianne Handler Reason for Consultation   CHF  HPI: Michelle [name] is a 80 y.o. female with a history of CAD with prior PTCA of RCA (1990) s/p recent STEMI 02/18/14 s/p DES to RCA, recent pericardial effusion with recent course of steroids, diastolic CHF, COPD, IDDM and recent CHF admission ( discharged last week) who presented to [facility] ED on 03/14/14 with recurrent dyspnea.  Patient has had a protracted hospital course with 5 admissions over the past  2 months including this one. She has had recurrent dyspnea and seen twice in the ED in 12/2013-01/2014 for non-specific dyspnea and told she had sinus congestion and then treated with steroids and Abx when she presented the second time with no relief. She then presented with an acute STEMI 02/18/14 s/p DES to RCA c/b a secondary pericardial fluid treated with steroids. She was then re-admitted for dyspnea 8/19-8/20 for what was felt to be volume overload in setting of steroid use. Normal ECHO (no residual pericardial fluid). Pt was given IV lasix with reported 1.7 L fluid diuresis and discharged on HCTZ but no Lasix. Pt states that she was symptomatically improved for only 1 day, otherwise she has had persistent dyspnea at rest as well as with exertion that has been worsening. Has been compliant with meds. Denies any excess salt intake. No CP. No wheezing or cough. + LE swelling.   In the ER, hemodynamically stable. Afebrile. Satting >97% on RA. CBC and BMET essentially WNL apart from Cr. 1.16 (near baseline). Trop neg x1. EKG WNL. ProBNP 2100 (1400 from last admission).     Past Medical History  Diagnosis Date  . COPD (chronic obstructive pulmonary disease)   . CAD (coronary artery disease) 1990; 2015    Cardiac cath 1990 with Dr. Lia Foyer and pt reports blockage in  artery  with angioplasty. She has pictures that show severe stenosis mid RCA and a post PTCA picture with 30% residual stenosis post PTCA. Residual CAD, non obstructive per 2015 cath  . Hyperlipidemia with target LDL less than 70   . Essential hypertension   . Diabetes mellitus type 2, insulin dependent   . AV block, 1st degree   . 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 Myocardial infarction   . Anginal pain   . Shortness of breath   . Arthritis      Past Surgical History  Procedure Laterality Date  . Cholecystectomy    . Cataract extraction  2010  . Ptca  1990    PTCA of RCA  . Transthoracic echocardiogram  02/02/2012    mild LVH, EF 55-60%, Normal WM, Gr 1 DD; Mild MR  . Coronary angioplasty with stent placement  02/18/14    Promus DES to RCA    Allergies  Allergen Reactions  . Codeine Sulfate Nausea Only  . Morphine Sulfate Nausea Only    I have reviewed the patient's current medications . aspirin EC  81 mg Oral Daily  . atorvastatin  80 mg Oral q1800  . enalapril  20 mg Oral BID  . fenofibrate  160 mg Oral Daily  . fluticasone  1 spray Each Nare Daily  . furosemide  40 mg Intravenous  Q12H  . heparin  5,000 Units Subcutaneous 3 times per day  . insulin aspart  0-5 Units Subcutaneous QHS  . insulin aspart  0-9 Units Subcutaneous TID WC  . insulin glargine  15 Units Subcutaneous q morning - 10a  . metoprolol succinate  100 mg Oral Daily  . mometasone-formoterol  2 puff Inhalation BID  . omega-3 acid ethyl esters  2 g Oral BID  . pantoprazole  40 mg Oral Daily  . potassium chloride SA  20 mEq Oral Daily  . sodium chloride  3 mL Intravenous Q12H  . ticagrelor  90 mg Oral BID  . tiotropium  18 mcg Inhalation Daily  . vitamin E  400 Units Oral Daily     sodium chloride, acetaminophen, albuterol, nitroGLYCERIN, sodium chloride, traMADol  Prior to Admission medications   Medication Sig Start  Date End Date Taking? Authorizing Provider  acetaminophen (TYLENOL) 325 MG tablet Take 650 mg by mouth every 6 (six) hours as needed for mild pain.   Yes Historical Provider, MD  albuterol (PROVENTIL) (2.5 MG/3ML) 0.083% nebulizer solution Take 2.5 mg by nebulization 4 (four) times daily as needed for wheezing or shortness of breath.   Yes Historical Provider, MD  aspirin EC 81 MG tablet Take 81 mg by mouth daily.   Yes Historical Provider, MD  enalapril (VASOTEC) 20 MG tablet Take 20 mg by mouth 2 (two) times daily.   Yes Historical Provider, MD  fenofibrate micronized (LOFIBRA) 134 MG capsule Take 134 mg by mouth daily before breakfast.   Yes Historical Provider, MD  fish oil-omega-3 fatty acids 1000 MG capsule Take 2 g by mouth daily.     Yes Historical Provider, MD  fluticasone (FLONASE) 50 MCG/ACT nasal spray Place 1 spray into both nostrils daily.   Yes Historical Provider, MD  Fluticasone-Salmeterol (ADVAIR) 250-50 MCG/DOSE AEPB Inhale 1 puff into the lungs 2 (two) times daily.   Yes Historical Provider, MD  folic acid (FOLVITE) 956 MCG tablet Take 400 mcg by mouth daily.     Yes Historical Provider, MD  glimepiride (AMARYL) 2 MG tablet Take 2 mg by mouth 2 (two) times daily.   Yes Historical Provider, MD  hydrochlorothiazide (HYDRODIURIL) 25 MG tablet Take 25 mg by mouth daily.   Yes Historical Provider, MD  Insulin Glargine (LANTUS SOLOSTAR) 100 UNIT/ML Solostar Pen Inject 15 Units into the skin every morning.    Yes Historical Provider, MD  metFORMIN (GLUCOPHAGE) 1000 MG tablet Take 1,000 mg by mouth 2 (two) times daily with a meal.   Yes Historical Provider, MD  metoprolol succinate (TOPROL-XL) 100 MG 24 hr tablet Take 100 mg by mouth daily. Take with or immediately following a meal.   Yes Historical Provider, MD  nitroGLYCERIN (NITROSTAT) 0.4 MG SL tablet Place 0.4 mg under the tongue every 5 (five) minutes as needed for chest pain.   Yes Historical Provider, MD  pantoprazole (PROTONIX)  40 MG tablet Take 40 mg by mouth daily.   Yes Historical Provider, MD  potassium chloride SA (K-DUR,KLOR-CON) 20 MEQ tablet Take 20 mEq by mouth daily.   Yes Historical Provider, MD  rosuvastatin (CRESTOR) 40 MG tablet Take 40 mg by mouth daily.   Yes Historical Provider, MD  sodium chloride (OCEAN) 0.65 % SOLN nasal spray Place 1 spray into both nostrils as needed for congestion.   Yes Historical Provider, MD  ticagrelor (BRILINTA) 90 MG TABS tablet Take 90 mg by mouth 2 (two) times daily.   Yes Historical Provider,  MD  tiotropium (SPIRIVA) 18 MCG inhalation capsule Place 18 mcg into inhaler and inhale daily.   Yes Historical Provider, MD  traMADol (ULTRAM) 50 MG tablet Take 50 mg by mouth every 6 (six) hours as needed.   Yes Historical Provider, MD  vitamin E 400 UNIT capsule Take 400 Units by mouth daily.     Yes Historical Provider, MD     History   Social History  . Marital Status: Married    Spouse Name: N/A    Number of Children: 2  . Years of Education: N/A   Occupational History  . Retired    Social History Main Topics  . Smoking status: Former Smoker -- 1.00 packs/day for 55 years    Types: Cigarettes    Quit date: 09/29/2006  . Smokeless tobacco: Never Used  . Alcohol Use: No  . Drug Use: No  . Sexual Activity: Not on file   Other Topics Concern  . Not on file   Social History Narrative  . No narrative on file    Family Status  Relation Status Death Age  . Mother Deceased   . Father Deceased    Family History  Problem Relation Age of Onset  . Heart attack Mother 23  . Cancer Father 33     ROS:  Full 14 point review of systems complete and found to be negative unless listed above.  Physical Exam: Blood pressure 128/61, pulse 86, temperature 97.9 F (36.6 C), temperature source Oral, resp. rate 18, height 5\' 5"  (1.651 m), weight 171 lb 4.8 oz (77.701 kg), SpO2 98.00%.  General: Well developed, well nourished, female in no acute distress Head: Eyes PERRLA,  No xanthomas.   Normocephalic and atraumatic, oropharynx without edema or exudate. Dentition:  Lungs: CTAB Heart: HRRR S1 S2, no rub/gallop, Heart irregular rate and rhythm with S1, S2  murmur. pulses are 2+ extrem.   Neck: No carotid bruits. No lymphadenopathy. No JVD. Abdomen: Bowel sounds present, abdomen soft and non-tender without masses or hernias noted. Msk:  No spine or cva tenderness. No weakness, no joint deformities or effusions. Extremities: No clubbing or cyanosis.  edema.  Neuro: Alert and oriented X 3. No focal deficits noted. Psych:  Good affect, responds appropriately Skin: No rashes or lesions noted.  Labs:   Lab Results  Component Value Date   WBC 5.3 03/14/2014   HGB 11.3* 03/14/2014   HCT 34.5* 03/14/2014   MCV 81.0 03/14/2014   PLT 261 03/14/2014     Recent Labs Lab 03/14/14 0608  NA 140  K 3.3*  CL 101  CO2 22  BUN 20  CREATININE 1.04  CALCIUM 9.6  PROT 7.1  BILITOT 0.3  ALKPHOS 56  ALT 22  AST 24  GLUCOSE 119*  ALBUMIN 3.7   Magnesium  Date Value Ref Range Status  03/05/2014 1.5  1.5 - 2.5 mg/dL Final    Recent Labs  03/13/14 2242 03/14/14 0608  TROPONINI <0.30 <0.30    Recent Labs  03/13/14 1739  TROPIPOC 0.01   Pro B Natriuretic peptide (BNP)  Date/Time Value Ref Range Status  03/13/2014  4:54 PM 2091.0* 0 - 450 pg/mL Final  03/05/2014  8:15 AM 1401.0* 0 - 450 pg/mL Final   Lab Results  Component Value Date   CHOL 147 02/19/2014   HDL 43 02/19/2014   LDLCALC UNABLE TO CALCULATE IF TRIGLYCERIDE OVER 400 mg/dL 02/19/2014   TRIG 521* 02/19/2014   Lab Results  Component Value Date  DDIMER 0.65* 03/05/2014    TSH  Date/Time Value Ref Range Status  03/05/2014  2:45 PM 0.936  0.350 - 4.500 uIU/mL Final    Echo:  Study Date: 03/05/2014 LV EF: 60% - 65% Indications: Dyspnea 786.09. Study Conclusions - Left ventricle: The cavity size was normal. Systolic function was normal. The estimated ejection fraction was in the range of 60% to  65%. Wall motion was normal; there were no regional wall motion abnormalities. - Aortic valve: There was mild to moderate regurgitation. - Mitral valve: There was mild regurgitation. - Left atrium: The atrium was mildly dilated. - Pericardium, extracardiac: A trivial pericardial effusion was identified posterior to the heart.   ECG: NSR, low voltage. TWI in lead III and AVF, unchanged from prior.  Radiology:  Dg Chest 2 View (if Patient Has Fever And/or Copd)  03/13/2014   CLINICAL DATA:  COPD.  Diabetes.  EXAM: CHEST  2 VIEW  COMPARISON:  None.  FINDINGS: Cardiopericardial silhouette within normal limits. Mediastinal contours normal. Trachea midline. No airspace disease or effusion. Aortic arch atherosclerosis. Cholecystectomy clips are present in the right upper quadrant.  IMPRESSION: No active cardiopulmonary disease.   Electronically Signed   By: Dereck Ligas M.D.   On: 03/13/2014 18:07    Cath 02/18/14 IMPRESSIONS:  1. Normal left main coronary artery. 2. Mild disease in the mid left anterior descending artery and its branches. 3. Mild disease in the left circumflex artery and its branches. 4. 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 Treated with a 2.8 x 19 mm Covered stent. Distal to stent, a 2.75 x 20 mm Promus was deployed. Proximal to the covered stent, a 3.0 x 8 mm stent was deployed. Entire stented area dilated to 3.3 mm in diameter.  5. Normal left ventricular systolic function. LVEDP 19 mmHg. Ejection fraction 55%. RECOMMENDATION: Continue dual antiplatelet therapy indefinitely. She'll be watched in the ICU. Continue aggressive secondary prevention. I would continue dual antiplatelet therapy longer than usual given the covered stent.  A covered stent was used to cover the RCA aneurysm because athersclerostic disease causing MI was adjacent to the aneurysm. If the aneurysm ruptured during the procedure with conventional stent deployment, orout  side of the hospital after our manipulation, the patient would have tamponade and possibly death. Therefore, we elected to cover the aneurysm.    ASSESSMENT AND PLAN:    Active Problems:   Diastolic CHF   Dyspnea  Michelle [name] is a 80 y.o. female with a history of CAD with prior PTCA of RCA (1990) s/p recent STEMI 02/18/14 s/p DES to RCA, recent pericardial effusion with recent course of steroids, diastolic CHF, COPD, IDDM and recent CHF admission ( discharged last week) who presented to [facility] ED on 03/14/14 with recurrent dyspnea.  Acute on chronic Diastolic CHF  -- Was diuresed in hospital 1 week back, has been on HCTZ at home, weight up by 7lbs since discharge on 03/05/14 (167--> 174) and BNP slight up compared to last week 1400 -->2100. CXR with no pulm edema.  -- Placed on IV Lasix 40mg  BID -- Net neg 632mL, weight down 3 lbs.  -- Recommend giving her one more dose of IV Lasix today and switch to PO tomorrow. She reports that she historically took Lasix for years and was surprised to not be sent home on this last discharge. Likely can go home tomorrow on 40mg  PO lasix. Her creat is normal.   CAD- with recent STEMI and  PCI with stent/pericardial effusion  -- Troponin neg x3. ECG stable -- Continue ASA/brilinta, Toprol, statin. Brilinta can also cause dyspnea.  COPD  -- Stable, per IM  IDDM  -- Per IM  HLD- continue statin, fish oil and fibrate  HTN- continue enalapril 20mg  BID  Hypokalemia- supplementation per IM  Signed: Eileen Stanford, PA-C 03/14/2014 11:23 AM  Pager [phone]  Co-Sign MD Seen with Angelena Form PAC.  I agree with her assessment and plan.  The patient has diuresed 6 pounds overnight with IV Lasix.  Her peripheral edema has improved and her breathing has improved.  Physical examination does not reveal any evidence of pericardial tamponade or constrictive pericarditis.  Her lungs are clear.  The heart reveals no gallop or rub.  Would switch to oral  furosemide tomorrow and I would expect that she could be discharged tomorrow with close office followup.

## 2014-03-14 NOTE — Progress Notes (Addendum)
[facility] PROGRESS NOTE  Michelle [name] AOZ:[id number] DOB: 08-Oct-1932 DOA: 03/13/2014 PCP: Pcp Not In System  Assessment/Plan: 1-Acute on chronic Diastolic CHF -was diuresed in hospital 1 week back, has been on HCTZ at home, weight up by 7lbs since discharge and BNP slight up compared to last week -continue IV lasix, EF preserved based on last ECHO -I/Os, weights -Cards notified  2-CAD/recent STEMI and PCI with stent/pericardial effusion  -cycle troponin, continue ASA/brilinta, Toprol, statin -only trivial pericardial effusion based on last 2 echos  3-COPD  -No wheezing, albuterol PRN, dulera  4-IDDM  -continue lantus, SSI, A1C   Prophylaxis: heparin   Code Status: full Family Communication: none at bedside Disposition Plan: home when improved   Consultants:  Cards  HPI/Subjective: Breathing starting to improve  Objective: Filed Vitals:   03/14/14 0610  BP: 128/61  Pulse: 86  Temp: 97.9 F (36.6 C)  Resp: 18    Intake/Output Summary (Last 24 hours) at 03/14/14 1029 Last data filed at 03/14/14 0820  Gross per 24 hour  Intake    600 ml  Output   1200 ml  Net   -600 ml   Filed Weights   03/13/14 2300 03/14/14 0610  Weight: 79.243 kg (174 lb 11.2 oz) 77.701 kg (171 lb 4.8 oz)    Exam:   General:  AAOx3  Cardiovascular: S1S2/RRR  Respiratory: CTAB  Abdomen: sft, Nt, BS present  Musculoskeletal: trace edema    Data Reviewed: Basic Metabolic Panel:  Recent Labs Lab 03/13/14 1654 03/14/14 0608  NA 140 140  K 4.3 3.3*  CL 104 101  CO2 22 22  GLUCOSE 125* 119*  BUN 20 20  CREATININE 1.16* 1.04  CALCIUM 9.4 9.6   Liver Function Tests:  Recent Labs Lab 03/14/14 0608  AST 24  ALT 22  ALKPHOS 56  BILITOT 0.3  PROT 7.1  ALBUMIN 3.7   No results found for this basename: LIPASE, AMYLASE,  in the last 168 hours No results found for this basename: AMMONIA,  in the last 168 hours CBC:  Recent Labs Lab 03/13/14 1654  03/14/14 0608  WBC 4.7 5.3  NEUTROABS  --  3.0  HGB 10.7* 11.3*  HCT 32.9* 34.5*  MCV 81.6 81.0  PLT 249 261   Cardiac Enzymes:  Recent Labs Lab 03/13/14 2242 03/14/14 0608  TROPONINI <0.30 <0.30   BNP (last 3 results)  Recent Labs  02/18/14 1449 03/05/14 0815 03/13/14 1654  PROBNP 405.6 1401.0* 2091.0*   CBG:  Recent Labs Lab 03/13/14 2318 03/14/14 0430 03/14/14 0754  GLUCAP 84 128* 126*    No results found for this or any previous visit (from the past 240 hour(s)).   Studies: Dg Chest 2 View (if Patient Has Fever And/or Copd)  03/13/2014   CLINICAL DATA:  COPD.  Diabetes.  EXAM: CHEST  2 VIEW  COMPARISON:  None.  FINDINGS: Cardiopericardial silhouette within normal limits. Mediastinal contours normal. Trachea midline. No airspace disease or effusion. Aortic arch atherosclerosis. Cholecystectomy clips are present in the right upper quadrant.  IMPRESSION: No active cardiopulmonary disease.   Electronically Signed   By: Dereck Ligas M.D.   On: 03/13/2014 18:07    Scheduled Meds: . aspirin EC  81 mg Oral Daily  . atorvastatin  80 mg Oral q1800  . enalapril  20 mg Oral BID  . fenofibrate  160 mg Oral Daily  . fluticasone  1 spray Each Nare Daily  . furosemide  40 mg Intravenous Q12H  .  heparin  5,000 Units Subcutaneous 3 times per day  . hydrochlorothiazide  25 mg Oral Daily  . insulin aspart  0-9 Units Subcutaneous 6 times per day  . insulin glargine  15 Units Subcutaneous q morning - 10a  . metoprolol succinate  100 mg Oral Daily  . mometasone-formoterol  2 puff Inhalation BID  . omega-3 acid ethyl esters  2 g Oral BID  . pantoprazole  40 mg Oral Daily  . potassium chloride SA  20 mEq Oral Daily  . sodium chloride  3 mL Intravenous Q12H  . ticagrelor  90 mg Oral BID  . tiotropium  18 mcg Inhalation Daily  . vitamin E  400 Units Oral Daily   Continuous Infusions:  Antibiotics Given (last 72 hours)   None      Active Problems:   Diastolic CHF    Dyspnea    Time spent: 45min    [place] [facility] Pager [phone]. If 7PM-7AM, please contact night-coverage at [email or web address], password [facility] 03/14/2014, 10:29 AM  LOS: 1 day

## 2014-03-14 NOTE — Progress Notes (Signed)
Nutrition Brief Note  Patient identified on the Malnutrition Screening Tool (MST) Report  Wt Readings from Last 15 Encounters:  03/14/14 171 lb 4.8 oz (77.701 kg)  03/06/14 167 lb 8 oz (75.978 kg)  02/21/14 170 lb 8 oz (77.338 kg)  02/21/14 170 lb 8 oz (77.338 kg)  02/09/14 175 lb (79.379 kg)  01/15/14 180 lb (81.647 kg)  01/13/14 180 lb (81.647 kg)  01/07/14 181 lb (82.101 kg)  10/22/13 184 lb (83.462 kg)  07/08/13 189 lb (85.73 kg)  10/10/12 196 lb (88.905 kg)  09/27/12 196 lb (88.905 kg)  08/14/12 199 lb (90.266 kg)  04/12/12 193 lb (87.544 kg)  03/29/12 194 lb (87.998 kg)    Body mass index is 28.51 kg/(m^2). Patient meets criteria for Overweight based on current BMI.   Current diet order is 2 gram Sodium, patient is consuming approximately 85-100% of meals at this time. Labs and medications reviewed.   Pt resting soundly during time of RD assessment. Pt's weight fluctuating from 170-180 lb over past 3 months; however this is likely r/t CHF and fluid retention as pt has had excellent PO intake during admit and several previous admits  No nutrition interventions warranted at this time. If nutrition issues arise, please consult RD.   Atlee Abide MS RD LDN Clinical Dietitian [phone]

## 2014-03-15 DIAGNOSIS — M545 Low back pain, unspecified: Secondary | ICD-10-CM

## 2014-03-15 DIAGNOSIS — J449 Chronic obstructive pulmonary disease, unspecified: Secondary | ICD-10-CM

## 2014-03-15 DIAGNOSIS — M533 Sacrococcygeal disorders, not elsewhere classified: Secondary | ICD-10-CM

## 2014-03-15 LAB — COMPREHENSIVE METABOLIC PANEL
ALT: 20 U/L (ref 0–35)
AST: 20 U/L (ref 0–37)
Albumin: 3.7 g/dL (ref 3.5–5.2)
Alkaline Phosphatase: 55 U/L (ref 39–117)
Anion gap: 17 — ABNORMAL HIGH (ref 5–15)
BUN: 32 mg/dL — ABNORMAL HIGH (ref 6–23)
CO2: 25 meq/L (ref 19–32)
Calcium: 9.5 mg/dL (ref 8.4–10.5)
Chloride: 97 mEq/L (ref 96–112)
Creatinine, Ser: 1.38 mg/dL — ABNORMAL HIGH (ref 0.50–1.10)
GFR calc Af Amer: 41 mL/min — ABNORMAL LOW (ref >90)
GFR, EST NON AFRICAN AMERICAN: 35 mL/min — AB (ref >90)
Glucose, Bld: 121 mg/dL — ABNORMAL HIGH (ref 70–99)
Potassium: 3.1 mEq/L — ABNORMAL LOW (ref 3.7–5.3)
SODIUM: 139 meq/L (ref 137–147)
Total Bilirubin: 0.4 mg/dL (ref 0.3–1.2)
Total Protein: 6.9 g/dL (ref 6.0–8.3)

## 2014-03-15 LAB — CBC WITH DIFFERENTIAL/PLATELET
Basophils Absolute: 0 10*3/uL (ref 0.0–0.1)
Basophils Relative: 1 % (ref 0–1)
EOS PCT: 3 % (ref 0–5)
Eosinophils Absolute: 0.2 10*3/uL (ref 0.0–0.7)
HCT: 33.4 % — ABNORMAL LOW (ref 36.0–46.0)
HEMOGLOBIN: 10.9 g/dL — AB (ref 12.0–15.0)
LYMPHS ABS: 2.6 10*3/uL (ref 0.7–4.0)
Lymphocytes Relative: 44 % (ref 12–46)
MCH: 25.8 pg — AB (ref 26.0–34.0)
MCHC: 32.6 g/dL (ref 30.0–36.0)
MCV: 79.1 fL (ref 78.0–100.0)
MONOS PCT: 12 % (ref 3–12)
Monocytes Absolute: 0.7 10*3/uL (ref 0.1–1.0)
Neutro Abs: 2.3 10*3/uL (ref 1.7–7.7)
Neutrophils Relative %: 40 % — ABNORMAL LOW (ref 43–77)
Platelets: 278 10*3/uL (ref 150–400)
RBC: 4.22 MIL/uL (ref 3.87–5.11)
RDW: 16 % — ABNORMAL HIGH (ref 11.5–15.5)
WBC: 5.8 10*3/uL (ref 4.0–10.5)

## 2014-03-15 LAB — GLUCOSE, CAPILLARY
GLUCOSE-CAPILLARY: 132 mg/dL — AB (ref 70–99)
GLUCOSE-CAPILLARY: 114 mg/dL — AB (ref 70–99)
Glucose-Capillary: 124 mg/dL — ABNORMAL HIGH (ref 70–99)
Glucose-Capillary: 158 mg/dL — ABNORMAL HIGH (ref 70–99)

## 2014-03-15 MED ORDER — FUROSEMIDE 40 MG PO TABS
40.0000 mg | ORAL_TABLET | Freq: Every day | ORAL | Status: DC
  Administered 2014-03-15: 40 mg via ORAL
  Filled 2014-03-15: qty 1

## 2014-03-15 MED ORDER — POTASSIUM CHLORIDE CRYS ER 20 MEQ PO TBCR
40.0000 meq | EXTENDED_RELEASE_TABLET | Freq: Every day | ORAL | Status: DC
  Administered 2014-03-15 – 2014-03-17 (×3): 40 meq via ORAL
  Filled 2014-03-15 (×3): qty 2

## 2014-03-15 MED ORDER — FUROSEMIDE 10 MG/ML IJ SOLN
40.0000 mg | Freq: Every day | INTRAMUSCULAR | Status: DC
  Administered 2014-03-15: 40 mg via INTRAVENOUS
  Filled 2014-03-15: qty 4

## 2014-03-15 MED ORDER — ENALAPRIL MALEATE 20 MG PO TABS: 20.0000 mg | ORAL_TABLET | Freq: Every day | ORAL | Status: DC

## 2014-03-15 MED ORDER — FUROSEMIDE 40 MG PO TABS: 40.0000 mg | ORAL_TABLET | Freq: Every day | ORAL | Status: DC

## 2014-03-15 MED ORDER — ENALAPRIL MALEATE 20 MG PO TABS
20.0000 mg | ORAL_TABLET | Freq: Every day | ORAL | Status: DC
  Administered 2014-03-15: 20 mg via ORAL
  Filled 2014-03-15 (×2): qty 1

## 2014-03-15 NOTE — Progress Notes (Signed)
aSATURATION QUALIFICATIONS: (This note is used to comply with regulatory documentation for home oxygen)  Patient Saturations on Room Air at Rest = 88%  Patient Saturations on Room Air while Ambulating = 83%  Patient Saturations on 2 Liters of oxygen while Ambulating = 96%  Please briefly explain why patient needs home oxygen: SOB with exertion

## 2014-03-15 NOTE — Discharge Summary (Signed)
Physician Discharge Summary  Michelle [name] MNO:[id number] DOB: October 09, 1932 DOA: 03/13/2014  PCP: Pcp Not In System  Admit date: 03/13/2014 Discharge date: 03/15/2014  Time spent: 35 minutes  Recommendations for Outpatient Follow-up:  1. [facility] 9/2 at 9:50am 2. Please check Bmet on 9/2 3. IF creatinine rises >1.5, please stop MEtformin  Discharge Diagnoses:    Acute on chronic Diastolic CHF   IDDM type 2   Hyperlipidemia with target LDL less than 70   Essential hypertension   COPD   Back pain, lumbosacral   S/P RCA DES 02/18/14   CAD- RCA PCI '90s, RCA DES 02/18/14   Pericarditis-post MI (short course of steroids)   Diastolic CHF   Dyspnea   Discharge Condition: stable  Diet recommendation:low sodium, diabetic  Filed Weights   03/13/14 2300 03/14/14 0610 03/15/14 0623  Weight: 79.243 kg (174 lb 11.2 oz) 77.701 kg (171 lb 4.8 oz) 76.749 kg (169 lb 3.2 oz)    History of present illness:  History of Present Illness:This is a 80 y.o. year old female with significant past medical history of CAD s/p recent STEMI 02/18/14 s/p stenting, recent pericardial effusion with recent course of steroids, diastolic CHF, COPD, IDDM presented with dyspnea. Pt has had a fairly protracted course of medical care over the past 1-2 months including STEMI 02/18/14 s/p stenting with noted secondary pericardial fluid and readmission for dyspnea 8/19-8/20 for what was felt to be volume overload in setting of normal ECHO (no residual pericardial fluid). Pt was given IV lasix with reported 1.7 L fluid diuresis. Pt states that she was symptomatically improved for only 1 day, otherwise she has had persistent dyspnea at rest as well as with exertion. Has been compliant with meds. Denies any excess salt intake. No CP. No wheezing or cough. + LE swelling.  In the ER, hemodynamically stable, EKG WNL. ProBNP 2100 (1400 from last admission   Hospital Course:  1-Acute on chronic Diastolic CHF  -was  diuresed in hospital 1 week back, has been on HCTZ at home, weight up by 7lbs since recent discharge and BNP slight up compared to last week  -diuresed with IV lasix to good effect, weight down 5lbs -change to PO lasix 40mg  and supplemental Kcl at DC -EF preserved based on last ECHO  -has FU with cards on 9/2, needs Bmet then  2-CAD/recent STEMI and PCI with stent/pericardial effusion  -continue ASA/brilinta, Toprol, statin  -only trivial pericardial effusion based on last 2 echos   3-COPD  -No wheezing, albuterol PRN, dulera   4-IDDM  -continue lantus   Consultations:  Cards  Discharge Exam: Filed Vitals:   03/15/14 1006  BP: 114/51  Pulse:   Temp:   Resp:     General: AAOx3 Cardiovascular: S1S2/RRR Respiratory: CTAB  Discharge Instructions You were cared for by a hospitalist during your hospital stay. If you have any questions about your discharge medications or the care you received while you were in the hospital after you are discharged, you can call the unit and asked to speak with the hospitalist on call if the hospitalist that took care of you is not available. Once you are discharged, your primary care physician will handle any further medical issues. Please note that NO REFILLS for any discharge medications will be authorized once you are discharged, as it is imperative that you return to your primary care physician (or establish a relationship with a primary care physician if you do not have one) for  your aftercare needs so that they can reassess your need for medications and monitor your lab values.      Discharge Instructions   Diet - low sodium heart healthy    Complete by:  As directed      Diet Carb Modified    Complete by:  As directed      Increase activity slowly    Complete by:  As directed             Medication List    STOP taking these medications       hydrochlorothiazide 25 MG tablet  Commonly known as:  HYDRODIURIL      TAKE these  medications       acetaminophen 325 MG tablet  Commonly known as:  TYLENOL  Take 650 mg by mouth every 6 (six) hours as needed for mild pain.     albuterol (2.5 MG/3ML) 0.083% nebulizer solution  Commonly known as:  PROVENTIL  Take 2.5 mg by nebulization 4 (four) times daily as needed for wheezing or shortness of breath.     aspirin EC 81 MG tablet  Take 81 mg by mouth daily.     enalapril 20 MG tablet  Commonly known as:  VASOTEC  Take 1 tablet (20 mg total) by mouth daily.     fenofibrate micronized 134 MG capsule  Commonly known as:  LOFIBRA  Take 134 mg by mouth daily before breakfast.     fish oil-omega-3 fatty acids 1000 MG capsule  Take 2 g by mouth daily.     fluticasone 50 MCG/ACT nasal spray  Commonly known as:  FLONASE  Place 1 spray into both nostrils daily.     Fluticasone-Salmeterol 250-50 MCG/DOSE Aepb  Commonly known as:  ADVAIR  Inhale 1 puff into the lungs 2 (two) times daily.     folic acid 546 MCG tablet  Commonly known as:  FOLVITE  Take 400 mcg by mouth daily.     furosemide 40 MG tablet  Commonly known as:  LASIX  Take 1 tablet (40 mg total) by mouth daily.     glimepiride 2 MG tablet  Commonly known as:  AMARYL  Take 2 mg by mouth 2 (two) times daily.     LANTUS SOLOSTAR 100 UNIT/ML Solostar Pen  Generic drug:  Insulin Glargine  Inject 15 Units into the skin every morning.     metFORMIN 1000 MG tablet  Commonly known as:  GLUCOPHAGE  Take 1,000 mg by mouth 2 (two) times daily with a meal.     metoprolol succinate 100 MG 24 hr tablet  Commonly known as:  TOPROL-XL  Take 100 mg by mouth daily. Take with or immediately following a meal.     nitroGLYCERIN 0.4 MG SL tablet  Commonly known as:  NITROSTAT  Place 0.4 mg under the tongue every 5 (five) minutes as needed for chest pain.     pantoprazole 40 MG tablet  Commonly known as:  PROTONIX  Take 40 mg by mouth daily.     potassium chloride SA 20 MEQ tablet  Commonly known as:   K-DUR,KLOR-CON  Take 20 mEq by mouth daily.     rosuvastatin 40 MG tablet  Commonly known as:  CRESTOR  Take 40 mg by mouth daily.     sodium chloride 0.65 % Soln nasal spray  Commonly known as:  OCEAN  Place 1 spray into both nostrils as needed for congestion.     ticagrelor 90 MG Tabs tablet  Commonly known as:  BRILINTA  Take 90 mg by mouth 2 (two) times daily.     tiotropium 18 MCG inhalation capsule  Commonly known as:  SPIRIVA  Place 18 mcg into inhaler and inhale daily.     traMADol 50 MG tablet  Commonly known as:  ULTRAM  Take 50 mg by mouth every 6 (six) hours as needed.     vitamin E 400 UNIT capsule  Take 400 Units by mouth daily.       Allergies  Allergen Reactions  . Codeine Sulfate Nausea Only  . Morphine Sulfate Nausea Only   Follow-up Information   Follow up with [facility]. Schedule an appointment as soon as possible for a visit on 03/19/2014. (Appt. with Dr. Kathlen Mody on 03/19/14 at 9:50 AM for hospital follow-up.  PCP appt. with Dr. Yong Channel to establish on 04/02/14 at 2:30 PM.   Per call to [facility].  )    Contact information:   [place] [phone]        The results of significant diagnostics from this hospitalization (including imaging, microbiology, ancillary and laboratory) are listed below for reference.    Significant Diagnostic Studies: Dg Chest 2 View (if Patient Has Fever And/or Copd)  03/13/2014   CLINICAL DATA:  COPD.  Diabetes.  EXAM: CHEST  2 VIEW  COMPARISON:  None.  FINDINGS: Cardiopericardial silhouette within normal limits. Mediastinal contours normal. Trachea midline. No airspace disease or effusion. Aortic arch atherosclerosis. Cholecystectomy clips are present in the right upper quadrant.  IMPRESSION: No active cardiopulmonary disease.   Electronically Signed   By: Dereck Ligas M.D.   On: 03/13/2014 18:07   Dg Chest 2 View  03/05/2014   CLINICAL DATA:  Short of breath  EXAM: CHEST  2 VIEW   COMPARISON:  02/18/2014  FINDINGS: The heart size and mediastinal contours are within normal limits. Both lungs are clear. The visualized skeletal structures are unremarkable.  IMPRESSION: No active cardiopulmonary disease.   Electronically Signed   By: Franchot Gallo M.D.   On: 03/05/2014 08:48   Dg Chest Port 1 View  02/18/2014   CLINICAL DATA:  Chest pain.  EXAM: PORTABLE CHEST - 1 VIEW  COMPARISON:  02/09/2014  FINDINGS: Normal heart size and mediastinal contours. Mild atelectasis in the left mid chest. No consolidation or edema. No effusion or pneumothorax. No acute osseous findings.  IMPRESSION: 1.  No acute cardiopulmonary disease. 2. Minimal atelectasis on the left.   Electronically Signed   By: Jorje Guild M.D.   On: 02/18/2014 05:32    Microbiology: No results found for this or any previous visit (from the past 240 hour(s)).   Labs: Basic Metabolic Panel:  Recent Labs Lab 03/13/14 1654 03/14/14 0608 03/15/14 0310  NA 140 140 139  K 4.3 3.3* 3.1*  CL 104 101 97  CO2 22 22 25   GLUCOSE 125* 119* 121*  BUN 20 20 32*  CREATININE 1.16* 1.04 1.38*  CALCIUM 9.4 9.6 9.5   Liver Function Tests:  Recent Labs Lab 03/14/14 0608 03/15/14 0310  AST 24 20  ALT 22 20  ALKPHOS 56 55  BILITOT 0.3 0.4  PROT 7.1 6.9  ALBUMIN 3.7 3.7   No results found for this basename: LIPASE, AMYLASE,  in the last 168 hours No results found for this basename: AMMONIA,  in the last 168 hours CBC:  Recent Labs Lab 03/13/14 1654 03/14/14 0608 03/15/14 0310  WBC 4.7 5.3 5.8  NEUTROABS  --  3.0 2.3  HGB 10.7* 11.3* 10.9*  HCT 32.9* 34.5* 33.4*  MCV 81.6 81.0 79.1  PLT 249 261 278   Cardiac Enzymes:  Recent Labs Lab 03/13/14 2242 03/14/14 0608 03/14/14 1030  TROPONINI <0.30 <0.30 <0.30   BNP: BNP (last 3 results)  Recent Labs  02/18/14 1449 03/05/14 0815 03/13/14 1654  PROBNP 405.6 1401.0* 2091.0*   CBG:  Recent Labs Lab 03/14/14 0754 03/14/14 1113 03/14/14 1554  03/14/14 2040 03/15/14 0600  GLUCAP 126* 149* 170* 133* 124*       Signed:  Orvell Careaga  [facility] 03/15/2014, 10:26 AM

## 2014-03-15 NOTE — Progress Notes (Signed)
Patient with some increased SOB later in the morning. She is going to ambulate with nursing staff, monitor O2 sats and symptoms. If does not do well would keep another day and resume her IV lasix. Given her multiple admits over the last few weeks would lean toward conservative approach with discharge planning. She appears to have f/u with cards already on 9/2.     Zandra Abts MD

## 2014-03-16 LAB — GLUCOSE, CAPILLARY
GLUCOSE-CAPILLARY: 157 mg/dL — AB (ref 70–99)
GLUCOSE-CAPILLARY: 157 mg/dL — AB (ref 70–99)
Glucose-Capillary: 178 mg/dL — ABNORMAL HIGH (ref 70–99)
Glucose-Capillary: 143 mg/dL — ABNORMAL HIGH (ref 70–99)
Glucose-Capillary: 146 mg/dL — ABNORMAL HIGH (ref 70–99)

## 2014-03-16 LAB — CBC WITH DIFFERENTIAL/PLATELET
Basophils Absolute: 0 10*3/uL (ref 0.0–0.1)
Basophils Relative: 1 % (ref 0–1)
EOS ABS: 0.2 10*3/uL (ref 0.0–0.7)
Eosinophils Relative: 3 % (ref 0–5)
HCT: 33.8 % — ABNORMAL LOW (ref 36.0–46.0)
HEMOGLOBIN: 11.1 g/dL — AB (ref 12.0–15.0)
LYMPHS ABS: 2.2 10*3/uL (ref 0.7–4.0)
Lymphocytes Relative: 43 % (ref 12–46)
MCH: 26.1 pg (ref 26.0–34.0)
MCHC: 32.8 g/dL (ref 30.0–36.0)
MCV: 79.5 fL (ref 78.0–100.0)
Monocytes Absolute: 0.6 10*3/uL (ref 0.1–1.0)
Monocytes Relative: 11 % (ref 3–12)
NEUTROS ABS: 2.2 10*3/uL (ref 1.7–7.7)
NEUTROS PCT: 42 % — AB (ref 43–77)
PLATELETS: 282 10*3/uL (ref 150–400)
RBC: 4.25 MIL/uL (ref 3.87–5.11)
RDW: 16 % — ABNORMAL HIGH (ref 11.5–15.5)
WBC: 5.2 10*3/uL (ref 4.0–10.5)

## 2014-03-16 LAB — BASIC METABOLIC PANEL
ANION GAP: 18 — AB (ref 5–15)
BUN: 47 mg/dL — ABNORMAL HIGH (ref 6–23)
CALCIUM: 9.4 mg/dL (ref 8.4–10.5)
CO2: 24 meq/L (ref 19–32)
Chloride: 95 mEq/L — ABNORMAL LOW (ref 96–112)
Creatinine, Ser: 1.91 mg/dL — ABNORMAL HIGH (ref 0.50–1.10)
GFR calc Af Amer: 28 mL/min — ABNORMAL LOW (ref >90)
GFR calc non Af Amer: 24 mL/min — ABNORMAL LOW (ref >90)
GLUCOSE: 184 mg/dL — AB (ref 70–99)
POTASSIUM: 3.7 meq/L (ref 3.7–5.3)
SODIUM: 137 meq/L (ref 137–147)

## 2014-03-16 NOTE — Progress Notes (Signed)
Discussed patient with Dr. Broadus John. She is holding ACEI and Lasix today due to bump in Cr and does not think we necessarily have to see today. The patient may be discharged tomorrow. We will f/u in AM. Melina Copa PA-C

## 2014-03-16 NOTE — Progress Notes (Signed)
[facility] PROGRESS NOTE  Michelle [name] XFG:[id number] DOB: 1933/01/07 DOA: 03/13/2014 PCP: Pcp Not In System  Assessment/Plan: 1-Acute on chronic Diastolic CHF -was diuresed in hospital 1 week back, has been on HCTZ at home, weight up by 7lbs since discharge and BNP slight up compared to last week -diuresed with IV lasix, improved EF preserved based on last ECHO -negative 1.5L, weights up and down -Cards following, off O2 again -Due to AKI, hold lasix and ACE today  2-CAD/recent STEMI and PCI with stent/pericardial effusion  -cycle troponin, continue ASA/brilinta, Toprol, statin -only trivial pericardial effusion based on last 2 echos  3-COPD  -No wheezing, albuterol PRN, dulera  4-IDDM  -continue lantus, SSI, A1C   5. AKI  -due to diuresis and ACE  Prophylaxis: heparin   Code Status: full Family Communication: none at bedside Disposition Plan: home when improved   Consultants:  Cards  HPI/Subjective: Breathing improved   Objective: Filed Vitals:   03/16/14 1010  BP: 105/49  Pulse: 84  Temp: 98 F (36.7 C)  Resp:     Intake/Output Summary (Last 24 hours) at 03/16/14 1153 Last data filed at 03/16/14 1016  Gross per 24 hour  Intake   1200 ml  Output    400 ml  Net    800 ml   Filed Weights   03/14/14 0610 03/15/14 0623 03/16/14 0510  Weight: 77.701 kg (171 lb 4.8 oz) 76.749 kg (169 lb 3.2 oz) 78.019 kg (172 lb)    Exam:   General:  AAOx3  Cardiovascular: S1S2/RRR  Respiratory: CTAB  Abdomen: sft, Nt, BS present  Musculoskeletal: no edema    Data Reviewed: Basic Metabolic Panel:  Recent Labs Lab 03/13/14 1654 03/14/14 0608 03/15/14 0310 03/16/14 0350  NA 140 140 139 137  K 4.3 3.3* 3.1* 3.7  CL 104 101 97 95*  CO2 22 22 25 24   GLUCOSE 125* 119* 121* 184*  BUN 20 20 32* 47*  CREATININE 1.16* 1.04 1.38* 1.91*  CALCIUM 9.4 9.6 9.5 9.4   Liver Function Tests:  Recent Labs Lab 03/14/14 0608 03/15/14 0310  AST 24 20   ALT 22 20  ALKPHOS 56 55  BILITOT 0.3 0.4  PROT 7.1 6.9  ALBUMIN 3.7 3.7   No results found for this basename: LIPASE, AMYLASE,  in the last 168 hours No results found for this basename: AMMONIA,  in the last 168 hours CBC:  Recent Labs Lab 03/13/14 1654 03/14/14 0608 03/15/14 0310 03/16/14 0350  WBC 4.7 5.3 5.8 5.2  NEUTROABS  --  3.0 2.3 2.2  HGB 10.7* 11.3* 10.9* 11.1*  HCT 32.9* 34.5* 33.4* 33.8*  MCV 81.6 81.0 79.1 79.5  PLT 249 261 278 282   Cardiac Enzymes:  Recent Labs Lab 03/13/14 2242 03/14/14 0608 03/14/14 1030  TROPONINI <0.30 <0.30 <0.30   BNP (last 3 results)  Recent Labs  02/18/14 1449 03/05/14 0815 03/13/14 1654  PROBNP 405.6 1401.0* 2091.0*   CBG:  Recent Labs Lab 03/15/14 1646 03/15/14 2059 03/16/14 0308 03/16/14 0536 03/16/14 1131  GLUCAP 132* 114* 157* 157* 178*    No results found for this or any previous visit (from the past 240 hour(s)).   Studies: No results found.  Scheduled Meds: . aspirin EC  81 mg Oral Daily  . atorvastatin  80 mg Oral q1800  . fenofibrate  160 mg Oral Daily  . fluticasone  1 spray Each Nare Daily  . heparin  5,000 Units Subcutaneous 3 times per day  .  insulin aspart  0-5 Units Subcutaneous QHS  . insulin aspart  0-9 Units Subcutaneous TID WC  . insulin glargine  15 Units Subcutaneous q morning - 10a  . metoprolol succinate  100 mg Oral Daily  . mometasone-formoterol  2 puff Inhalation BID  . omega-3 acid ethyl esters  2 g Oral BID  . pantoprazole  40 mg Oral Daily  . potassium chloride SA  40 mEq Oral Daily  . sodium chloride  3 mL Intravenous Q12H  . ticagrelor  90 mg Oral BID  . tiotropium  18 mcg Inhalation Daily  . vitamin E  400 Units Oral Daily   Continuous Infusions:  Antibiotics Given (last 72 hours)   None      Active Problems:   IDDM type 2   Hyperlipidemia with target LDL less than 70   Essential hypertension   COPD   Back pain, lumbosacral   S/P RCA DES 02/18/14   CAD-  RCA PCI '90s, RCA DES 02/18/14   Pericarditis-post MI (short course of steroids)   Diastolic CHF   Dyspnea    Time spent: 68min    [facility] Pager [phone]. If 7PM-7AM, please contact night-coverage at [email or web address], password [facility] 03/16/2014, 11:53 AM  LOS: 3 days

## 2014-03-17 LAB — CBC WITH DIFFERENTIAL/PLATELET
Basophils Absolute: 0 10*3/uL (ref 0.0–0.1)
Basophils Relative: 1 % (ref 0–1)
EOS ABS: 0.1 10*3/uL (ref 0.0–0.7)
EOS PCT: 3 % (ref 0–5)
HCT: 33.3 % — ABNORMAL LOW (ref 36.0–46.0)
HEMOGLOBIN: 10.9 g/dL — AB (ref 12.0–15.0)
LYMPHS ABS: 2 10*3/uL (ref 0.7–4.0)
Lymphocytes Relative: 42 % (ref 12–46)
MCH: 26.3 pg (ref 26.0–34.0)
MCHC: 32.7 g/dL (ref 30.0–36.0)
MCV: 80.2 fL (ref 78.0–100.0)
MONO ABS: 0.5 10*3/uL (ref 0.1–1.0)
MONOS PCT: 11 % (ref 3–12)
Neutro Abs: 2.1 10*3/uL (ref 1.7–7.7)
Neutrophils Relative %: 43 % (ref 43–77)
Platelets: 250 10*3/uL (ref 150–400)
RBC: 4.15 MIL/uL (ref 3.87–5.11)
RDW: 15.7 % — ABNORMAL HIGH (ref 11.5–15.5)
WBC: 4.8 10*3/uL (ref 4.0–10.5)

## 2014-03-17 LAB — BASIC METABOLIC PANEL
ANION GAP: 11 (ref 5–15)
BUN: 40 mg/dL — AB (ref 6–23)
CHLORIDE: 102 meq/L (ref 96–112)
CO2: 25 mEq/L (ref 19–32)
Calcium: 9.7 mg/dL (ref 8.4–10.5)
Creatinine, Ser: 1.22 mg/dL — ABNORMAL HIGH (ref 0.50–1.10)
GFR calc Af Amer: 48 mL/min — ABNORMAL LOW (ref >90)
GFR calc non Af Amer: 41 mL/min — ABNORMAL LOW (ref >90)
GLUCOSE: 130 mg/dL — AB (ref 70–99)
Potassium: 4.2 mEq/L (ref 3.7–5.3)
Sodium: 138 mEq/L (ref 137–147)

## 2014-03-17 LAB — GLUCOSE, CAPILLARY
Glucose-Capillary: 135 mg/dL — ABNORMAL HIGH (ref 70–99)
Glucose-Capillary: 128 mg/dL — ABNORMAL HIGH (ref 70–99)

## 2014-03-17 MED ORDER — FUROSEMIDE 40 MG PO TABS
40.0000 mg | ORAL_TABLET | Freq: Every day | ORAL | Status: DC
  Administered 2014-03-17: 40 mg via ORAL
  Filled 2014-03-17: qty 1

## 2014-03-17 NOTE — Progress Notes (Signed)
Patient oxygen saturation walking on room air between 97-99%. Patient has minimal complaints of shortness of breath with walking.

## 2014-03-17 NOTE — Progress Notes (Signed)
Patient given discharge instructions and all questions answered.  Pt. Discharged via wheelchair with all belongings.   

## 2014-03-17 NOTE — Progress Notes (Signed)
Patient evaluated for community based chronic disease management services with [facility] as a benefit of patient's [employer]. Spoke with patient and son at bedside to explain [facility] services.  Services have been accepted with written consent.  Son, Sharnay Cashion [phone] lives in the home and is her authorized contact.  Patient needs diabetic education for medications and diet.  Also is a new CHF patient.  Son will get a scale for her as she has not been weighing prior to this admission.  Needs focus on early symptom recognition for CHF.  Son expressed concern regarding nocturnal SOB.  No home oxygen ordered at this time.  Patient will receive a post discharge transition of care call and will be evaluated for monthly home visits for assessments and disease process education.  Left contact information and [facility] literature at bedside. Made Inpatient Case Manager aware that [facility] following. Of note, [facility] services does not replace or interfere with any services that are arranged by inpatient case management or social work.  For additional questions or referrals please contact Corliss Blacker BSN RN [facility]ia[facility] at [phone].

## 2014-03-17 NOTE — Progress Notes (Signed)
     I spoke with Dr. [place] who said we likely did not need to see [name] today. Plan is for discharge today. I spoke with patient who is feeling well and wanting to leave. She has follow up in our clinic on 03/19/14 with Richardson Dopp PA-C.    Angelena Form PA-C  [facility]  Pager [phone]

## 2014-03-17 NOTE — Discharge Summary (Signed)
Physician Discharge Summary  Michelle [name]:[id number] DOB: 1933-07-12 DOA: 03/13/2014  PCP: Pcp Not In System  Admit date: 03/13/2014 Discharge date: 03/17/2014  Time spent: 35 minutes  Recommendations for Outpatient Follow-up:  1. [facility] 9/2 at 9:50am 2. Please check Bmet on 9/2 3. IF creatinine rises >1.5, please stop MEtformin  Discharge Diagnoses:    Acute on chronic Diastolic CHF   IDDM type 2   Hyperlipidemia with target LDL less than 70   Essential hypertension   COPD   Back pain, lumbosacral   S/P RCA DES 02/18/14   CAD- RCA PCI '90s, RCA DES 02/18/14   Pericarditis-post MI (short course of steroids)   Diastolic CHF   Dyspnea   Discharge Condition: stable  Diet recommendation:low sodium, diabetic  Filed Weights   03/15/14 0623 03/16/14 0510 03/17/14 0559  Weight: 76.749 kg (169 lb 3.2 oz) 78.019 kg (172 lb) 77.7 kg (171 lb 4.8 oz)    History of present illness:  History of Present Illness:This is a 80 y.o. year old female with significant past medical history of CAD s/p recent STEMI 02/18/14 s/p stenting, recent pericardial effusion with recent course of steroids, diastolic CHF, COPD, IDDM presented with dyspnea. Pt has had a fairly protracted course of medical care over the past 1-2 months including STEMI 02/18/14 s/p stenting with noted secondary pericardial fluid and readmission for dyspnea 8/19-8/20 for what was felt to be volume overload in setting of normal ECHO (no residual pericardial fluid). Pt was given IV lasix with reported 1.7 L fluid diuresis. Pt states that she was symptomatically improved for only 1 day, otherwise she has had persistent dyspnea at rest as well as with exertion. Has been compliant with meds. Denies any excess salt intake. No CP. No wheezing or cough. + LE swelling.  In the ER, hemodynamically stable, EKG WNL. ProBNP 2100 (1400 from last admission   Hospital Course:  1-Acute on chronic Diastolic CHF  -was diuresed in  hospital 1 week back, has been on HCTZ at home, weight up by 7lbs since recent discharge and BNP slight up compared to last week  -diuresed with IV lasix to good effect, weight down 5lbs -change to PO lasix 40mg  and supplemental Kcl at DC -EF preserved based on last ECHO  -has FU with cards on 9/2, needs Bmet then  2-CAD/recent STEMI and PCI with stent/pericardial effusion  -continue ASA/brilinta, Toprol, statin  -only trivial pericardial effusion based on last 2 echos   3-COPD  -No wheezing, albuterol PRN, dulera   4-IDDM  -continue lantus, metformin, watch kidney function closely  5. AKi -due to ACe and diuretics, enalapril stopped due to this -BP stable off this, may need alternate BP med if BP starts trending up upon FU. -creatinine improved from 1.9 yesterday to 1.2 at discharge   Consultations:  Cards  Discharge Exam: Filed Vitals:   03/17/14 0559  BP: 122/47  Pulse: 74  Temp: 97.5 F (36.4 C)  Resp: 19    General: AAOx3 Cardiovascular: S1S2/RRR Respiratory: CTAB  Discharge Instructions You were cared for by a hospitalist during your hospital stay. If you have any questions about your discharge medications or the care you received while you were in the hospital after you are discharged, you can call the unit and asked to speak with the hospitalist on call if the hospitalist that took care of you is not available. Once you are discharged, your primary care physician will handle any further medical issues.  Please note that NO REFILLS for any discharge medications will be authorized once you are discharged, as it is imperative that you return to your primary care physician (or establish a relationship with a primary care physician if you do not have one) for your aftercare needs so that they can reassess your need for medications and monitor your lab values.      Discharge Instructions   Diet - low sodium heart healthy    Complete by:  As directed      Diet - low  sodium heart healthy    Complete by:  As directed      Diet Carb Modified    Complete by:  As directed      Diet Carb Modified    Complete by:  As directed      Increase activity slowly    Complete by:  As directed      Increase activity slowly    Complete by:  As directed             Medication List    STOP taking these medications       enalapril 20 MG tablet  Commonly known as:  VASOTEC     hydrochlorothiazide 25 MG tablet  Commonly known as:  HYDRODIURIL      TAKE these medications       acetaminophen 325 MG tablet  Commonly known as:  TYLENOL  Take 650 mg by mouth every 6 (six) hours as needed for mild pain.     albuterol (2.5 MG/3ML) 0.083% nebulizer solution  Commonly known as:  PROVENTIL  Take 2.5 mg by nebulization 4 (four) times daily as needed for wheezing or shortness of breath.     aspirin EC 81 MG tablet  Take 81 mg by mouth daily.     fenofibrate micronized 134 MG capsule  Commonly known as:  LOFIBRA  Take 134 mg by mouth daily before breakfast.     fish oil-omega-3 fatty acids 1000 MG capsule  Take 2 g by mouth daily.     fluticasone 50 MCG/ACT nasal spray  Commonly known as:  FLONASE  Place 1 spray into both nostrils daily.     Fluticasone-Salmeterol 250-50 MCG/DOSE Aepb  Commonly known as:  ADVAIR  Inhale 1 puff into the lungs 2 (two) times daily.     folic acid 355 MCG tablet  Commonly known as:  FOLVITE  Take 400 mcg by mouth daily.     furosemide 40 MG tablet  Commonly known as:  LASIX  Take 1 tablet (40 mg total) by mouth daily.     glimepiride 2 MG tablet  Commonly known as:  AMARYL  Take 2 mg by mouth 2 (two) times daily.     LANTUS SOLOSTAR 100 UNIT/ML Solostar Pen  Generic drug:  Insulin Glargine  Inject 15 Units into the skin every morning.     metFORMIN 1000 MG tablet  Commonly known as:  GLUCOPHAGE  Take 1,000 mg by mouth 2 (two) times daily with a meal.     metoprolol succinate 100 MG 24 hr tablet  Commonly known  as:  TOPROL-XL  Take 100 mg by mouth daily. Take with or immediately following a meal.     nitroGLYCERIN 0.4 MG SL tablet  Commonly known as:  NITROSTAT  Place 0.4 mg under the tongue every 5 (five) minutes as needed for chest pain.     pantoprazole 40 MG tablet  Commonly known as:  PROTONIX  Take 40  mg by mouth daily.     potassium chloride SA 20 MEQ tablet  Commonly known as:  K-DUR,KLOR-CON  Take 20 mEq by mouth daily.     rosuvastatin 40 MG tablet  Commonly known as:  CRESTOR  Take 40 mg by mouth daily.     sodium chloride 0.65 % Soln nasal spray  Commonly known as:  OCEAN  Place 1 spray into both nostrils as needed for congestion.     ticagrelor 90 MG Tabs tablet  Commonly known as:  BRILINTA  Take 90 mg by mouth 2 (two) times daily.     tiotropium 18 MCG inhalation capsule  Commonly known as:  SPIRIVA  Place 18 mcg into inhaler and inhale daily.     traMADol 50 MG tablet  Commonly known as:  ULTRAM  Take 50 mg by mouth every 6 (six) hours as needed.     vitamin E 400 UNIT capsule  Take 400 Units by mouth daily.       Allergies  Allergen Reactions  . Codeine Sulfate Nausea Only  . Morphine Sulfate Nausea Only   Follow-up Information   Follow up with [facility]. Schedule an appointment as soon as possible for a visit on 03/19/2014. (Appt. with Dr. Kathlen Mody on 03/19/14 at 9:50 AM for hospital follow-up.  PCP appt. with Dr. Yong Channel to establish on 04/02/14 at 2:30 PM.   Per call to [facility].  )    Contact information:   [place] [id number]       Follow up with Richardson Dopp, PA-C On 03/19/2014. (9:50am)    Specialty:  Physician Assistant   Contact information:   [address] [place] [address] [phone]        The results of significant diagnostics from this hospitalization (including imaging, microbiology, ancillary and laboratory) are listed below for reference.    Significant Diagnostic  Studies: Dg Chest 2 View (if Patient Has Fever And/or Copd)  03/13/2014   CLINICAL DATA:  COPD.  Diabetes.  EXAM: CHEST  2 VIEW  COMPARISON:  None.  FINDINGS: Cardiopericardial silhouette within normal limits. Mediastinal contours normal. Trachea midline. No airspace disease or effusion. Aortic arch atherosclerosis. Cholecystectomy clips are present in the right upper quadrant.  IMPRESSION: No active cardiopulmonary disease.   Electronically Signed   By: Dereck Ligas M.D.   On: 03/13/2014 18:07   Dg Chest 2 View  03/05/2014   CLINICAL DATA:  Short of breath  EXAM: CHEST  2 VIEW  COMPARISON:  02/18/2014  FINDINGS: The heart size and mediastinal contours are within normal limits. Both lungs are clear. The visualized skeletal structures are unremarkable.  IMPRESSION: No active cardiopulmonary disease.   Electronically Signed   By: Franchot Gallo M.D.   On: 03/05/2014 08:48   Dg Chest Port 1 View  02/18/2014   CLINICAL DATA:  Chest pain.  EXAM: PORTABLE CHEST - 1 VIEW  COMPARISON:  02/09/2014  FINDINGS: Normal heart size and mediastinal contours. Mild atelectasis in the left mid chest. No consolidation or edema. No effusion or pneumothorax. No acute osseous findings.  IMPRESSION: 1.  No acute cardiopulmonary disease. 2. Minimal atelectasis on the left.   Electronically Signed   By: Jorje Guild M.D.   On: 02/18/2014 05:32    Microbiology: No results found for this or any previous visit (from the past 240 hour(s)).   Labs: Basic Metabolic Panel:  Recent Labs Lab 03/13/14 1654 03/14/14 3235 03/15/14  0310 03/16/14 0350 03/17/14 0745  NA 140 140 139 137 138  K 4.3 3.3* 3.1* 3.7 4.2  CL 104 101 97 95* 102  CO2 22 22 25 24 25   GLUCOSE 125* 119* 121* 184* 130*  BUN 20 20 32* 47* 40*  CREATININE 1.16* 1.04 1.38* 1.91* 1.22*  CALCIUM 9.4 9.6 9.5 9.4 9.7   Liver Function Tests:  Recent Labs Lab 03/14/14 0608 03/15/14 0310  AST 24 20  ALT 22 20  ALKPHOS 56 55  BILITOT 0.3 0.4  PROT  7.1 6.9  ALBUMIN 3.7 3.7   No results found for this basename: LIPASE, AMYLASE,  in the last 168 hours No results found for this basename: AMMONIA,  in the last 168 hours CBC:  Recent Labs Lab 03/13/14 1654 03/14/14 0608 03/15/14 0310 03/16/14 0350 03/17/14 0700  WBC 4.7 5.3 5.8 5.2 4.8  NEUTROABS  --  3.0 2.3 2.2 2.1  HGB 10.7* 11.3* 10.9* 11.1* 10.9*  HCT 32.9* 34.5* 33.4* 33.8* 33.3*  MCV 81.6 81.0 79.1 79.5 80.2  PLT 249 261 278 282 250   Cardiac Enzymes:  Recent Labs Lab 03/13/14 2242 03/14/14 0608 03/14/14 1030  TROPONINI <0.30 <0.30 <0.30   BNP: BNP (last 3 results)  Recent Labs  02/18/14 1449 03/05/14 0815 03/13/14 1654  PROBNP 405.6 1401.0* 2091.0*   CBG:  Recent Labs Lab 03/16/14 0536 03/16/14 1131 03/16/14 1629 03/16/14 2102 03/17/14 0546  GLUCAP 157* 178* 143* 146* 135*       Signed:  Sahana Boyland  [facility] 03/17/2014, 9:32 AM

## 2014-03-19 ENCOUNTER — Ambulatory Visit (INDEPENDENT_AMBULATORY_CARE_PROVIDER_SITE_OTHER): Payer: Commercial Managed Care - HMO | Admitting: Physician Assistant

## 2014-03-19 ENCOUNTER — Telehealth: Payer: Self-pay | Admitting: Physician Assistant

## 2014-03-19 ENCOUNTER — Encounter: Payer: Self-pay | Admitting: Physician Assistant

## 2014-03-19 VITALS — BP 150/70 | HR 89 | Ht 65.0 in | Wt 169.0 lb

## 2014-03-19 DIAGNOSIS — I5032 Chronic diastolic (congestive) heart failure: Secondary | ICD-10-CM

## 2014-03-19 DIAGNOSIS — R0989 Other specified symptoms and signs involving the circulatory and respiratory systems: Secondary | ICD-10-CM

## 2014-03-19 DIAGNOSIS — J449 Chronic obstructive pulmonary disease, unspecified: Secondary | ICD-10-CM

## 2014-03-19 DIAGNOSIS — I2510 Atherosclerotic heart disease of native coronary artery without angina pectoris: Secondary | ICD-10-CM

## 2014-03-19 DIAGNOSIS — I509 Heart failure, unspecified: Secondary | ICD-10-CM

## 2014-03-19 DIAGNOSIS — I10 Essential (primary) hypertension: Secondary | ICD-10-CM

## 2014-03-19 DIAGNOSIS — R0609 Other forms of dyspnea: Secondary | ICD-10-CM

## 2014-03-19 DIAGNOSIS — J4489 Other specified chronic obstructive pulmonary disease: Secondary | ICD-10-CM

## 2014-03-19 DIAGNOSIS — R0600 Dyspnea, unspecified: Secondary | ICD-10-CM

## 2014-03-19 DIAGNOSIS — E785 Hyperlipidemia, unspecified: Secondary | ICD-10-CM

## 2014-03-19 LAB — BASIC METABOLIC PANEL
BUN: 45 mg/dL — ABNORMAL HIGH (ref 6–23)
CALCIUM: 9.9 mg/dL (ref 8.4–10.5)
CO2: 27 mEq/L (ref 19–32)
Chloride: 101 mEq/L (ref 96–112)
Creatinine, Ser: 1.5 mg/dL — ABNORMAL HIGH (ref 0.4–1.2)
GFR: 35.31 mL/min — ABNORMAL LOW (ref >60.00)
Glucose, Bld: 150 mg/dL — ABNORMAL HIGH (ref 70–99)
Potassium: 4.3 mEq/L (ref 3.5–5.1)
Sodium: 137 mEq/L (ref 135–145)

## 2014-03-19 LAB — BRAIN NATRIURETIC PEPTIDE: PRO B NATRI PEPTIDE: 124 pg/mL — AB (ref 0.0–100.0)

## 2014-03-19 MED ORDER — PRASUGREL HCL 10 MG PO TABS: 10.0000 mg | ORAL_TABLET | Freq: Every day | ORAL | Status: DC

## 2014-03-19 NOTE — Patient Instructions (Addendum)
Your physician has recommended you make the following change in your medication:  1) STOP Brilinta. Take your last dose this evening 2) START Effient Take 60mg  (6 Tablets) tomorrow in the am. Then take 10mg  (1 Tablet) daily  Lab Today: Bmet, [facility] physician recommends that you schedule a follow-up appointment in: 4 weeks with Dr.Mcalhany/ or Margaret Pyle a day that [place] is in the office

## 2014-03-19 NOTE — Progress Notes (Signed)
Cardiology Office Note    Date:  03/19/2014   ID:  Michelle [name], DOB [birth year], MRN [id number]  PCP:  Pcp Not In System  Cardiologist:  Dr. Lauree Chandler     History of Present Illness: Michelle [name] is a 80 y.o. female with a hx of CAD s/p PTCA of a large dominant RCA in 1990, DM, HTN, HLD, COPD, former tobacco abuse.    Patient has had several admissions over the past 2 months.  She was admitted with an Inferior STEMI 8/4-8/7. Emergent cardiac catheterization demonstrated an occluded RCA with aneurysm formation at the prior angioplasty site. Aneurysm was treated with a Covered stent. Occlusion was treated with 2 drug-eluting stents (one proximal and one distal to the covered stent).  There was a small pericardial effusion noted post MI.  She was DC on short course of steroids and FU echo demonstrated just trivial effusion.  Readmitted 8/19-8/20 with dyspnea that improved with one dose of IV Lasix.  Echo demonstrated normal LVF and no significant pericardial effusion.  Readmitted 2/80-0/34 with a/c diastolic CHF.  Diuresis was c/b AKI and ACEI was held.  Cr improved at DC.  She returns for FU.    Since discharge, her weights have been stable. She still has dyspnea at times. She sleeps on 2 pillows chronically. She denies PND. She denies LE edema. She has a nonproductive cough. She denies recurrent chest pain. She denies syncope.   Studies:  - LHC (02/18/14):  Mild disease in the LAD and CFX, mid RCA occluded with aneurysm, EF 55% >>> PCI: Aneurysm treated with a Covered stent; occlusion treated with 2.75 x 20 mm and 3.0 x 8 mm Promus DES  - Echo (03/05/14):  EF 60-65%, normal wall motion, mild to moderate AI, mild MR, mild LAE, trivial effusion  - Nuclear (9/13):  No ischemia, EF 74%; NORMAL   Recent Labs/Images: 02/19/2014: HDL Cholesterol by NMR 43; LDL (calc) UNABLE TO CALCULATE IF TRIGLYCERIDE OVER 400 mg/dL  03/05/2014: TSH 0.936  03/13/2014: Pro B Natriuretic peptide (BNP)  2091.0*  03/15/2014: ALT 20  03/17/2014: Creatinine 1.22*; Hemoglobin 10.9*; Potassium 4.2   Dg Chest 2 View (if Patient Has Fever And/or Copd)  03/13/2014    IMPRESSION: No active cardiopulmonary disease.   Electronically Signed   By: Dereck Ligas M.D.   On: 03/13/2014 18:07     Wt Readings from Last 3 Encounters:  03/19/14 169 lb (76.658 kg)  03/17/14 171 lb 4.8 oz (77.7 kg)  03/06/14 167 lb 8 oz (75.978 kg)     Past Medical History  Diagnosis Date  . COPD (chronic obstructive pulmonary disease)   . CAD (coronary artery disease) 1990; 2015    Cardiac cath 1990 with Dr. Lia Foyer and [name] Residual CAD, non obstructive per 2015 cath  . Hyperlipidemia with target LDL less than 70   . Essential hypertension   . Diabetes mellitus type 2, insulin dependent   . AV block, 1st degree   . 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 Myocardial infarction   . Anginal pain   . Shortness of breath   . Arthritis     Current Outpatient Prescriptions  Medication Sig Dispense Refill  . acetaminophen (TYLENOL) 325 MG  tablet Take 650 mg by mouth every 6 (six) hours as needed for mild pain.      Marland Kitchen albuterol (PROVENTIL) (2.5 MG/3ML) 0.083% nebulizer solution Take 2.5 mg by nebulization 4 (four) times daily as needed for wheezing or shortness of breath.      Marland Kitchen aspirin EC 81 MG tablet Take 81 mg by mouth daily.      . fenofibrate micronized (LOFIBRA) 134 MG capsule Take 134 mg by mouth daily before breakfast.      . fish oil-omega-3 fatty acids 1000 MG capsule Take 2 g by mouth daily.        . fluticasone (FLONASE) 50 MCG/ACT nasal spray Place 1 spray into both nostrils daily.      . Fluticasone-Salmeterol (ADVAIR) 250-50 MCG/DOSE AEPB Inhale 1 puff into the lungs 2 (two)  times daily.      . folic acid (FOLVITE) 093 MCG tablet Take 400 mcg by mouth daily.        . furosemide (LASIX) 40 MG tablet Take 1 tablet (40 mg total) by mouth daily.  30 tablet  0  . glimepiride (AMARYL) 2 MG tablet Take 2 mg by mouth 2 (two) times daily.      . Insulin Glargine (LANTUS SOLOSTAR) 100 UNIT/ML Solostar Pen Inject 15 Units into the skin every morning.       . metFORMIN (GLUCOPHAGE) 1000 MG tablet Take 1,000 mg by mouth 2 (two) times daily with a meal.      . metoprolol succinate (TOPROL-XL) 100 MG 24 hr tablet Take 100 mg by mouth daily. Take with or immediately following a meal.      . nitroGLYCERIN (NITROSTAT) 0.4 MG SL tablet Place 0.4 mg under the tongue every 5 (five) minutes as needed for chest pain.      . pantoprazole (PROTONIX) 40 MG tablet Take 40 mg by mouth daily.      . potassium chloride SA (K-DUR,KLOR-CON) 20 MEQ tablet Take 20 mEq by mouth daily.      . rosuvastatin (CRESTOR) 40 MG tablet Take 40 mg by mouth daily.      . sodium chloride (OCEAN) 0.65 % SOLN nasal spray Place 1 spray into both nostrils as needed for congestion.      . ticagrelor (BRILINTA) 90 MG TABS tablet Take 90 mg by mouth 2 (two) times daily.      Marland Kitchen tiotropium (SPIRIVA) 18 MCG inhalation capsule Place 18 mcg into inhaler and inhale daily.      . traMADol (ULTRAM) 50 MG tablet Take 50 mg by mouth every 6 (six) hours as needed.      . vitamin E 400 UNIT capsule Take 400 Units by mouth daily.         No current facility-administered medications for this visit.     Allergies:   Codeine sulfate and Morphine sulfate   Social History:  The patient  reports that she quit smoking about 7 years ago. Her smoking use included Cigarettes. She has a 55 pack-year smoking history. She has never used smokeless tobacco. She reports that she does not drink alcohol or use illicit drugs.   Family History:  The patient's family history includes Cancer (age of onset: 59) in her father; Heart attack in her son;  Heart attack (age of onset: 49) in her mother.   ROS:  Please see the history of present illness.      All other systems reviewed and negative.   PHYSICAL EXAM: VS:  BP 150/70  Pulse 89  Ht 5\' 5"  (1.651 m)  Wt 169 lb (76.658 kg)  BMI 28.12 kg/m2 Well nourished, well developed, in no acute distress HEENT: normal Neck: no JVD Cardiac:  normal S1, S2; RRR; no murmur Lungs:  clear to auscultation bilaterally, no wheezing, rhonchi or rales Abd: soft, nontender, no hepatomegaly Ext: right wrist without hematoma or mass  No LE edema Skin: warm and dry Neuro:  CNs 2-12 intact, no focal abnormalities noted  EKG:  NSR, HR 89, low voltage, NSSTTW changes.     ASSESSMENT AND PLAN:  1. Dyspnea:  I suspect her episodes of dyspnea are likely related to Brilinta. I suggest that we stop her Brilinta and change her to either Effient or Plavix. She does not have a history of stroke.  I reviewed this with Dr. Casandra Doffing.  I will stop her Brilinta and start her on Effient. She will be loaded with 60 mg x1 then take Effient 10 mg daily.  Check a basic metabolic panel and BNP. If her BNP is significantly elevated, I will adjust her Lasix further. 2. Atherosclerosis of native coronary artery of native heart without angina pectoris: She is doing well without angina. Continue dual antiplatelet therapy  For at least one year. Change Brilinta to Effient as noted. Continue beta blocker, statin.  3. Essential hypertension: Borderline elevated.  Repeat basic metabolic panel today. If creatinine stable, resume enalapril. 4. Hyperlipidemia with target LDL less than 70: Continue statin. 5. Chronic diastolic CHF (congestive heart failure): Check BNP. Adjust Lasix if necessary. Repeat basic metabolic panel. ACE inhibitor previously held secondary to AKI.  If creatinine is stable, resume enalapril. 6. COPD:  Likely contributing to dyspnea as well.    Disposition:  FU with me or Dr. Lauree Chandler in 4 weeks.     Signed, Versie Starks, MHS 03/19/2014 10:28 AM    [facility] [place], [place], [place]  [address] Phone: [phone]; Fax: [phone]

## 2014-03-19 NOTE — Telephone Encounter (Signed)
The patient called stating she took the Effient load AND brilinta tonight.  No hisotry of GI bleeding.  I told her to monitor BMs and urine for blood and to throw out the brilinta.  Michelle Brazeau PA-C.

## 2014-03-21 ENCOUNTER — Telehealth: Payer: Self-pay

## 2014-03-21 ENCOUNTER — Telehealth: Payer: Self-pay | Admitting: *Deleted

## 2014-03-21 ENCOUNTER — Telehealth: Payer: Self-pay | Admitting: Cardiovascular Disease

## 2014-03-21 DIAGNOSIS — I5031 Acute diastolic (congestive) heart failure: Secondary | ICD-10-CM

## 2014-03-21 DIAGNOSIS — I10 Essential (primary) hypertension: Secondary | ICD-10-CM

## 2014-03-21 MED ORDER — FUROSEMIDE 40 MG PO TABS: 20.0000 mg | ORAL_TABLET | Freq: Every day | ORAL | Status: DC

## 2014-03-21 MED ORDER — POTASSIUM CHLORIDE CRYS ER 20 MEQ PO TBCR: 10.0000 meq | EXTENDED_RELEASE_TABLET | Freq: Every day | ORAL | Status: DC

## 2014-03-21 NOTE — Telephone Encounter (Signed)
pt's son Shanon Brow rtnd call. Shanon Brow was notified about lab results and med changes with repeat BMET 9/11. Shanon Brow verbalized understanding to Plan of Care.

## 2014-03-21 NOTE — Telephone Encounter (Signed)
Michelle [name] was given instructions of meds to discontinue and meds to continue. Will have pt bring in logged sugars for the 11th appt.   Michelle [name] stated understanding.

## 2014-03-21 NOTE — Telephone Encounter (Signed)
Stop amaryl if lantus as lantus has already been stopped. Log sugars and bring them in on 11th.

## 2014-03-21 NOTE — Telephone Encounter (Signed)
error 

## 2014-03-21 NOTE — Telephone Encounter (Signed)
lmptcb to go over lab results and med changes to be made with repeat labs

## 2014-03-21 NOTE — Telephone Encounter (Signed)
Michelle [name]  Blood Sugars are low.   [phone] 81  Pt has OV with Dr. Yong Channel on the 11th.  Pt has a diary of numbers and also stated that she has not taken Lantus all week. The pt is currently on 3 differently DM meds.

## 2014-03-22 ENCOUNTER — Other Ambulatory Visit: Payer: Self-pay | Admitting: Internal Medicine

## 2014-03-27 ENCOUNTER — Telehealth: Payer: Self-pay

## 2014-03-27 NOTE — Telephone Encounter (Signed)
Dr. Yong Channel im not sure what the intent of the message is, maybe an FYI but please advise.

## 2014-03-27 NOTE — Telephone Encounter (Signed)
Offer patient a sooner appointment to be focused on shortness of breath. She can keep her establish visit to further go through her history.

## 2014-03-27 NOTE — Telephone Encounter (Signed)
Pt schedule to come in next week to establish with Dr. Yong Channel.  Nurse reports during the last few visits with patient she has reported intermittent sob.  Nurse thinks it may be due to panic attacks .  Pt reports this happens when her son leaves to go to work but also she sometimes wakes up in the middle of the night.  Nurse also reports pt was taken off of Lantus due to  low blood sugars.  However, her readings are in the 160 -170 range.

## 2014-03-27 NOTE — Telephone Encounter (Signed)
Michelle [name] can you please call pt and get them scheduled to come in sooner.

## 2014-03-28 ENCOUNTER — Other Ambulatory Visit (INDEPENDENT_AMBULATORY_CARE_PROVIDER_SITE_OTHER): Payer: Commercial Managed Care - HMO

## 2014-03-28 DIAGNOSIS — I5031 Acute diastolic (congestive) heart failure: Secondary | ICD-10-CM

## 2014-03-28 DIAGNOSIS — I10 Essential (primary) hypertension: Secondary | ICD-10-CM

## 2014-03-28 DIAGNOSIS — I509 Heart failure, unspecified: Secondary | ICD-10-CM

## 2014-03-28 LAB — BASIC METABOLIC PANEL
BUN: 29 mg/dL — AB (ref 6–23)
CO2: 26 meq/L (ref 19–32)
Calcium: 9.4 mg/dL (ref 8.4–10.5)
Chloride: 104 mEq/L (ref 96–112)
Creatinine, Ser: 1.1 mg/dL (ref 0.4–1.2)
GFR: 48.84 mL/min — ABNORMAL LOW (ref >60.00)
Glucose, Bld: 161 mg/dL — ABNORMAL HIGH (ref 70–99)
Potassium: 3.8 mEq/L (ref 3.5–5.1)
Sodium: 137 mEq/L (ref 135–145)

## 2014-03-28 NOTE — Telephone Encounter (Signed)
Pt scheduled  

## 2014-04-01 ENCOUNTER — Telehealth: Payer: Self-pay | Admitting: *Deleted

## 2014-04-01 NOTE — Telephone Encounter (Signed)
pt notified about lab results with verbal understanding to results given today

## 2014-04-02 ENCOUNTER — Ambulatory Visit (INDEPENDENT_AMBULATORY_CARE_PROVIDER_SITE_OTHER): Payer: Medicare HMO | Admitting: Family Medicine

## 2014-04-02 ENCOUNTER — Encounter: Payer: Self-pay | Admitting: Family Medicine

## 2014-04-02 VITALS — BP 120/78 | HR 72 | Temp 98.1°F | Wt 174.0 lb

## 2014-04-02 DIAGNOSIS — I509 Heart failure, unspecified: Secondary | ICD-10-CM

## 2014-04-02 DIAGNOSIS — I10 Essential (primary) hypertension: Secondary | ICD-10-CM

## 2014-04-02 DIAGNOSIS — I5032 Chronic diastolic (congestive) heart failure: Secondary | ICD-10-CM

## 2014-04-02 DIAGNOSIS — R0602 Shortness of breath: Secondary | ICD-10-CM

## 2014-04-02 DIAGNOSIS — E119 Type 2 diabetes mellitus without complications: Secondary | ICD-10-CM

## 2014-04-02 DIAGNOSIS — Z794 Long term (current) use of insulin: Secondary | ICD-10-CM

## 2014-04-02 NOTE — Assessment & Plan Note (Signed)
Thought to be caused by Brilinta. Has improved off of medication and on effient.

## 2014-04-02 NOTE — Progress Notes (Signed)
[name], MD Phone: [phone]  Subjective:  Patient presents today to establish care with me as their new primary care provider. Patient was formerly a patient of Dr. Leanne Chang. Chief complaint-noted.   Hospital follow up/medication review Diastolic CHF Shortness of breath Acute kidney injury  Patient was admitted from August 27 August 31 for acute on chronic diastolic heart failure. This was after she was found to have a STEMI on August 4 status post stenting in she's had multiple trips back to the hospital since that time. During her most recent visit she was diuresed with IV Lasix and weight came down 5 pounds. Her ejection fraction was preserved on echocardiogram.  She was also noted to have acute kidney injury and enalapril stop. Her creatinine was checked on September 2 and it increased again to 1.5. Her Lasix was decreased to 20 mg and she was continued off enalapril as she had been since discharge. About a week later her creatinine improved to 1.1.  Cardiology.thought shortness of breath could be coming from Brilinta so she was changed to effient. She states her shortness of breath has resolved since that time despite decreasing Lasix.  ROS-no current chest pain or shortness of breath. Her weight is up 5 pounds from September 2.  Diabetes. Stable. Review CBGs which ran from 93-171 with an average of around 120. She did have 220 wire in the last week. Patient has only been taking metformin. She is not taking Lantus or Amaryl Lab Results  Component Value Date   HGBA1C 6.4* 03/14/2014   ROS-no hypoglycemia symptoms  Hypertension- well-controlled  BP Readings from Last 3 Encounters:  04/02/14 120/78  03/19/14 150/70  03/17/14 122/47  Compliant with medications-yes without side effects ROS-Denies any CP, HA, SOB, blurry vision. LE edema stable. No orthopnea, PND.   The following were reviewed and entered/updated in epic: Past Medical History  Diagnosis Date  . COPD  (chronic obstructive pulmonary disease)   . CAD (coronary artery disease) 1990; 2015    Cardiac cath 1990 with Dr. Lia Foyer and [name] Residual CAD, non obstructive per 2015 cath. STEMI status post stent in August 2015.  Marland Kitchen Hyperlipidemia with target LDL less than 70   . Essential hypertension   . Diabetes mellitus type 2, insulin dependent   . AV block, 1st degree   . 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 Myocardial infarction   . Anginal pain   . Shortness of breath   . Arthritis    Patient Active Problem List   Diagnosis Date Noted  . Chronic diastolic CHF (congestive heart failure) 03/13/2014    Priority: High  . CAD- RCA PCI '90s, RCA DES 02/18/14 03/05/2014    Priority: High  . STEMI 02/18/14- RCA DES 02/21/2014    Priority: High  . IDDM type 2 03/26/2007    Priority: High  . Pericarditis-post MI (short course of steroids) 03/06/2014    Priority: Medium  . SOB (shortness of breath) 03/05/2014    Priority: Medium  . Hyperlipidemia with target LDL less than 70 03/26/2007    Priority: Medium  . Essential hypertension 04/21/2006    Priority: Medium  . COPD 04/21/2006    Priority: Medium  . Hematoma, rt forearm 02/21/2014  Priority: Low  . Back pain, lumbosacral 10/10/2012    Priority: Low   Past Surgical History  Procedure Laterality Date  . Cholecystectomy    . Cataract extraction  2010  . Ptca  1990    PTCA of RCA  . Transthoracic echocardiogram  02/02/2012    mild LVH, EF 55-60%, Normal WM, Gr 1 DD; Mild MR  . Coronary angioplasty with stent placement  02/18/14    Promus DES to RCA    Family History  Problem Relation Age of Onset  . Heart attack Mother 27  . Cancer Father 67  . Heart attack Son     Medications- reviewed and  updated Current Outpatient Prescriptions  Medication Sig Dispense Refill  . acetaminophen (TYLENOL) 325 MG tablet Take 650 mg by mouth every 6 (six) hours as needed for mild pain.      Marland Kitchen albuterol (PROVENTIL) (2.5 MG/3ML) 0.083% nebulizer solution Take 2.5 mg by nebulization 4 (four) times daily as needed for wheezing or shortness of breath.      Marland Kitchen aspirin EC 81 MG tablet Take 81 mg by mouth daily.      . enalapril (VASOTEC) 20 MG tablet Take 20 mg by mouth daily.      . fenofibrate micronized (LOFIBRA) 134 MG capsule Take 134 mg by mouth daily before breakfast.      . fish oil-omega-3 fatty acids 1000 MG capsule Take 2 g by mouth daily.        . Fluticasone-Salmeterol (ADVAIR) 250-50 MCG/DOSE AEPB Inhale 1 puff into the lungs 2 (two) times daily.      . folic acid (FOLVITE) 376 MCG tablet Take 400 mcg by mouth daily.        . furosemide (LASIX) 20 MG tablet Take 20 mg by mouth daily.      . metFORMIN (GLUCOPHAGE) 1000 MG tablet TAKE 1 TABLET BY MOUTH TWO TIMES A DAY  60 tablet  5  . metoprolol succinate (TOPROL-XL) 100 MG 24 hr tablet Take 100 mg by mouth daily. Take with or immediately following a meal.      . pantoprazole (PROTONIX) 40 MG tablet Take 40 mg by mouth daily.      . potassium chloride SA (K-DUR,KLOR-CON) 20 MEQ tablet Take 0.5 tablets (10 mEq total) by mouth daily.      . prasugrel (EFFIENT) 10 MG TABS tablet Take 1 tablet (10 mg total) by mouth daily.  30 tablet  5  . rosuvastatin (CRESTOR) 40 MG tablet Take 40 mg by mouth daily.      . sodium chloride (OCEAN) 0.65 % SOLN nasal spray Place 1 spray into both nostrils as needed for congestion.      Marland Kitchen tiotropium (SPIRIVA) 18 MCG inhalation capsule Place 18 mcg into inhaler and inhale daily.      . vitamin E 400 UNIT capsule Take 400 Units by mouth daily.        . fluticasone (FLONASE) 50 MCG/ACT nasal spray Place 1 spray into both nostrils daily.      . nitroGLYCERIN (NITROSTAT) 0.4 MG SL tablet Place 0.4 mg under the tongue every  5 (five) minutes as needed for chest pain.      . traMADol (ULTRAM) 50 MG tablet Take 50 mg by mouth every 6 (six) hours as needed.       No current facility-administered medications for this visit.    Allergies-reviewed and updated Allergies  Allergen Reactions  . Codeine Sulfate Nausea Only  . Morphine Sulfate Nausea  Only    History   Social History  . Marital Status: Married    Spouse Name: N/A    Number of Children: 2  . Years of Education: N/A   Occupational History  . Retired    Social History Main Topics  . Smoking status: Former Smoker -- 1.00 packs/day for 55 years    Types: Cigarettes    Quit date: 09/29/2006  . Smokeless tobacco: Never Used  . Alcohol Use: No  . Drug Use: No  . Sexual Activity: None   Other Topics Concern  . None   Social History Narrative  . None    ROS--See HPI   Objective: BP 120/78  Pulse 72  Temp(Src) 98.1 F (36.7 C)  Wt 174 lb (78.926 kg) Gen: NAD, resting comfortably, appears older than stated age HEENT: Mucous membranes are moist.  CV: RRR no murmurs rubs or gallops Lungs: CTAB no crackles, wheeze, rhonchi Abdomen: soft/nontender/nondistended/normal bowel sounds.  Ext: no edema  Assessment/Plan:  IDDM type 2 Given comorbidities A1c goal would be reasonable at 8. Based off of her sugars as reported in this note I believe it is safe to continue metformin alone. Discontinued Lantus and Amaryl permanently. Will check A1c in 3 months patient to return in one week and bring a log of sugars again  Essential hypertension Well-controlled today. Continue Lasix 20 mg, metoprolol 100 mg extended release. Since creatinine improved will restart enalapril and followup creatinine in one week as well as blood pressure  SOB (shortness of breath) Thought to be caused by Brilinta. Has improved off of medication and on effient.   Chronic diastolic CHF (congestive heart failure) Weight is up 5 pounds but patient asymptomatic. Continue  Lasix 20 mg. Followup one week.    >50% of 30 minute office visit was spent on counseling and coordination of care.Patient asked for medication review in office today. We made a plan to throw away medications that she is told no longer to use. The only medication we kept but she is not currently using is Amaryl. I also will patient through the findings of her hospitalization and answered any questions and comforted patient as this has been a long process.  Meds ordered this encounter  Medications  . furosemide (LASIX) 20 MG tablet    Sig: Take 20 mg by mouth daily.  . enalapril (VASOTEC) 20 MG tablet    Sig: Take 20 mg by mouth daily.

## 2014-04-02 NOTE — Assessment & Plan Note (Addendum)
Given comorbidities A1c goal would be reasonable at 8. Based off of her sugars as reported in this note I believe it is safe to continue metformin alone. Discontinued Lantus and Amaryl permanently. Will check A1c in 3 months patient to return in one week and bring a log of sugars again

## 2014-04-02 NOTE — Assessment & Plan Note (Signed)
Weight is up 5 pounds but patient asymptomatic. Continue Lasix 20 mg. Followup one week.

## 2014-04-02 NOTE — Assessment & Plan Note (Signed)
Well-controlled today. Continue Lasix 20 mg, metoprolol 100 mg extended release. Since creatinine improved will restart enalapril and followup creatinine in one week as well as blood pressure

## 2014-04-02 NOTE — Patient Instructions (Signed)
Continue to check blood pressure once a day in morning only after 5 minutes of rest. Restart 1 tab of enalapril.   Continue blood sugar log. Please write down the time of day and times of meals when you do this. Continue metformin only.   See me in 1 week

## 2014-04-03 ENCOUNTER — Other Ambulatory Visit: Payer: Self-pay | Admitting: Internal Medicine

## 2014-04-03 ENCOUNTER — Telehealth: Payer: Self-pay

## 2014-04-03 NOTE — Telephone Encounter (Signed)
Beth from [employer] states that she went to visit pt today and pt is in need of her [facility] inhaler. Pt was in the office yesterday but this was not filled for her, Eustaquio Maize would like to know if you want pt to continue to have this inhaler if not let her know. Beth can be reached at [phone].

## 2014-04-03 NOTE — Telephone Encounter (Signed)
Do not se on medication list from current or previous rx. She is on advair. Please ask Eustaquio Maize if she meant advair?

## 2014-04-10 ENCOUNTER — Ambulatory Visit (INDEPENDENT_AMBULATORY_CARE_PROVIDER_SITE_OTHER): Payer: Commercial Managed Care - HMO | Admitting: Family Medicine

## 2014-04-10 ENCOUNTER — Encounter: Payer: Self-pay | Admitting: Family Medicine

## 2014-04-10 VITALS — BP 140/82 | HR 64 | Temp 98.1°F | Wt 173.0 lb

## 2014-04-10 DIAGNOSIS — I509 Heart failure, unspecified: Secondary | ICD-10-CM

## 2014-04-10 DIAGNOSIS — Z23 Encounter for immunization: Secondary | ICD-10-CM

## 2014-04-10 DIAGNOSIS — I5032 Chronic diastolic (congestive) heart failure: Secondary | ICD-10-CM

## 2014-04-10 DIAGNOSIS — I10 Essential (primary) hypertension: Secondary | ICD-10-CM

## 2014-04-10 DIAGNOSIS — E119 Type 2 diabetes mellitus without complications: Secondary | ICD-10-CM

## 2014-04-10 DIAGNOSIS — Z794 Long term (current) use of insulin: Secondary | ICD-10-CM

## 2014-04-10 LAB — BASIC METABOLIC PANEL
BUN: 20 mg/dL (ref 6–23)
CALCIUM: 9.5 mg/dL (ref 8.4–10.5)
CO2: 24 mEq/L (ref 19–32)
CREATININE: 1 mg/dL (ref 0.4–1.2)
Chloride: 103 mEq/L (ref 96–112)
GFR: 57.47 mL/min — AB (ref >60.00)
GLUCOSE: 137 mg/dL — AB (ref 70–99)
Potassium: 3.8 mEq/L (ref 3.5–5.1)
Sodium: 135 mEq/L (ref 135–145)

## 2014-04-10 NOTE — Patient Instructions (Addendum)
Diabetes-continue metformin alone  Blood pressure-up slightly. Check creatinine today  Congestive heart failure-glad weight and symptoms are stable and that you have cardiology follow up soon. No changes today  See me back early December and we will recheck your a1c in regards to your diabetes Health Maintenance Due  Topic Date Due  . Influenza Vaccine -today 02/15/2014

## 2014-04-10 NOTE — Progress Notes (Signed)
Michelle Reddish, MD Phone: [phone]  Subjective:  Patient presents today to establish care with me as their new primary care provider. Patient was formerly a patient of Dr. Leanne Chang. Chief complaint-noted.   Diastolic CHF-stable Weight stable at home on 20mg  lasix.   ROS-no current chest pain or shortness of breath. Weight down 1 lb. LE edema -none. No orthopnea, PND.   Diabetes. Stable. Review CBGs which ran from 120-140 with some in AM and some in PM.  Patient has only been taking metformin. She is not taking Lantus or Amaryl Lab Results  Component Value Date   HGBA1C 6.4* 03/14/2014   ROS-no hypoglycemia symptoms, no polyuria  Hypertension- well-controlled  BP Readings from Last 3 Encounters:  04/10/14 140/82  04/02/14 120/78  03/19/14 150/70  Compliant with medications-yes without side effects ROS-Denies any CP, HA, SOB, blurry vision.   The following were reviewed and entered/updated in epic: Past Medical History  Diagnosis Date  . COPD (chronic obstructive pulmonary disease)   . CAD (coronary artery disease) 1990; 2015    Cardiac cath 1990 with Dr. Lia Foyer and [name] Residual CAD, non obstructive per 2015 cath. STEMI status post stent in August 2015.  Marland Kitchen Hyperlipidemia with target LDL less than 70   . Essential hypertension   . Diabetes mellitus type 2, insulin dependent   . AV block, 1st degree   . 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 Myocardial infarction   . Anginal pain   . Shortness of breath   . Arthritis    Patient Active Problem List   Diagnosis Date Noted  . Chronic diastolic CHF (congestive heart failure) 03/13/2014    Priority: High  . CAD- RCA PCI '90s, RCA DES 02/18/14 03/05/2014    Priority: High  . STEMI 02/18/14- RCA  DES 02/21/2014    Priority: High  . IDDM type 2 03/26/2007    Priority: High  . Pericarditis-post MI (short course of steroids) 03/06/2014    Priority: Medium  . SOB (shortness of breath) 03/05/2014    Priority: Medium  . Hyperlipidemia with target LDL less than 70 03/26/2007    Priority: Medium  . Essential hypertension 04/21/2006    Priority: Medium  . COPD 04/21/2006    Priority: Medium  . Hematoma, rt forearm 02/21/2014    Priority: Low  . Back pain, lumbosacral 10/10/2012    Priority: Low   Past Surgical History  Procedure Laterality Date  . Cholecystectomy    . Cataract extraction  2010  . Ptca  1990    PTCA of RCA  . Transthoracic echocardiogram  02/02/2012    mild LVH, EF 55-60%, Normal WM, Gr 1 DD; Mild MR  . Coronary angioplasty with stent placement  02/18/14    Promus DES to RCA    Family History  Problem Relation Age of Onset  . Heart attack Mother 71  . Cancer Father 59  . Heart attack Son     Medications- reviewed and updated Current Outpatient Prescriptions  Medication Sig Dispense Refill  . albuterol (PROVENTIL) (2.5 MG/3ML) 0.083% nebulizer solution Take 2.5 mg by nebulization 4 (four) times daily as needed for wheezing or shortness of breath.      Marland Kitchen aspirin EC 81 MG tablet Take 81  mg by mouth daily.      . enalapril (VASOTEC) 20 MG tablet Take 20 mg by mouth daily.      . fenofibrate micronized (LOFIBRA) 134 MG capsule Take 134 mg by mouth daily before breakfast.      . fish oil-omega-3 fatty acids 1000 MG capsule Take 2 g by mouth daily.        . fluticasone (FLONASE) 50 MCG/ACT nasal spray Place 1 spray into both nostrils daily.      . Fluticasone-Salmeterol (ADVAIR) 250-50 MCG/DOSE AEPB Inhale 1 puff into the lungs 2 (two) times daily.      . folic acid (FOLVITE) 026 MCG tablet Take 400 mcg by mouth daily.        . furosemide (LASIX) 20 MG tablet Take 20 mg by mouth daily.      . metFORMIN (GLUCOPHAGE) 1000 MG tablet TAKE 1 TABLET BY MOUTH TWO TIMES  A DAY  60 tablet  5  . metoprolol succinate (TOPROL-XL) 100 MG 24 hr tablet Take 100 mg by mouth daily. Take with or immediately following a meal.      . pantoprazole (PROTONIX) 40 MG tablet Take 40 mg by mouth daily.      . potassium chloride SA (K-DUR,KLOR-CON) 20 MEQ tablet Take 0.5 tablets (10 mEq total) by mouth daily.      . prasugrel (EFFIENT) 10 MG TABS tablet Take 1 tablet (10 mg total) by mouth daily.  30 tablet  5  . rosuvastatin (CRESTOR) 40 MG tablet Take 40 mg by mouth daily.      . sodium chloride (OCEAN) 0.65 % SOLN nasal spray Place 1 spray into both nostrils as needed for congestion.      Marland Kitchen tiotropium (SPIRIVA) 18 MCG inhalation capsule Place 18 mcg into inhaler and inhale daily.      . vitamin E 400 UNIT capsule Take 400 Units by mouth daily.        Marland Kitchen acetaminophen (TYLENOL) 325 MG tablet Take 650 mg by mouth every 6 (six) hours as needed for mild pain.      Marland Kitchen ADVAIR DISKUS 250-50 MCG/DOSE AEPB INHALE 1 PUFF INTO THE LUNGS 2 (TWO) TIMES DAILY.  60 each  1  . nitroGLYCERIN (NITROSTAT) 0.4 MG SL tablet Place 0.4 mg under the tongue every 5 (five) minutes as needed for chest pain.      . traMADol (ULTRAM) 50 MG tablet Take 50 mg by mouth every 6 (six) hours as needed.       No current facility-administered medications for this visit.    Allergies-reviewed and updated Allergies  Allergen Reactions  . Codeine Sulfate Nausea Only  . Morphine Sulfate Nausea Only    History   Social History  . Marital Status: Married    Spouse Name: N/A    Number of Children: 2  . Years of Education: N/A   Occupational History  . Retired    Social History Main Topics  . Smoking status: Former Smoker -- 1.00 packs/day for 55 years    Types: Cigarettes    Quit date: 09/29/2006  . Smokeless tobacco: Never Used  . Alcohol Use: No  . Drug Use: No  . Sexual Activity: None   Other Topics Concern  . None   Social History Narrative  . None    ROS--See HPI   Objective: BP 140/82   Pulse 64  Temp(Src) 98.1 F (36.7 C)  Wt 173 lb (78.472 kg) Gen: NAD, resting comfortably in chair, walker in  place in front of her HEENT: Mucous membranes are moist.  CV: RRR no murmurs rubs or gallops, no jvd Lungs: CTAB no crackles, wheeze, rhonchi Abdomen: soft/nontender/nondistended/normal bowel sounds.  Ext: no edema  Assessment/Plan:  IDDM type 2 Appears controlled off lantus and amaryl. Continue metformin alone. Recheck early December a1c.     Chronic diastolic CHF (congestive heart failure) Weight down 1 lb. Edema not present. No current symptoms-appears stable. Has cards follow up early October.   Essential hypertension Mild poor control of SBP. Continue metoprolol, lasix 20mg . COntinue enalapril at 1/2 dose of prior dose. If she has continued elevation at cards visit, could consider increasing enalapril to full dose if creatinine remains stable.

## 2014-04-10 NOTE — Assessment & Plan Note (Signed)
Weight down 1 lb. Edema not present. No current symptoms-appears stable. Has cards follow up early October.

## 2014-04-10 NOTE — Assessment & Plan Note (Signed)
Appears controlled off lantus and amaryl. Continue metformin alone. Recheck early December a1c.

## 2014-04-10 NOTE — Assessment & Plan Note (Signed)
Mild poor control of SBP. Continue metoprolol, lasix 20mg . COntinue enalapril at 1/2 dose of prior dose. If she has continued elevation at cards visit, could consider increasing enalapril to full dose if creatinine remains stable.

## 2014-04-16 ENCOUNTER — Other Ambulatory Visit: Payer: Self-pay | Admitting: Internal Medicine

## 2014-04-17 ENCOUNTER — Telehealth: Payer: Self-pay | Admitting: Family Medicine

## 2014-04-17 ENCOUNTER — Ambulatory Visit: Payer: Medicare HMO | Admitting: Physician Assistant

## 2014-04-17 NOTE — Telephone Encounter (Signed)
RN wanted to follow up on lab work and to let you know pt is having symptoms associated with panic attacks. Stats are all ok, no SOB etc. Might need a depression work up or anti anxiety med.

## 2014-04-17 NOTE — Telephone Encounter (Signed)
Patient already informed that kidney function was stable. If RN is concerned, should encourage patient to schedule office visit to discuss although would not want to add medication if possible.

## 2014-04-21 ENCOUNTER — Ambulatory Visit (INDEPENDENT_AMBULATORY_CARE_PROVIDER_SITE_OTHER): Payer: Commercial Managed Care - HMO | Admitting: Family Medicine

## 2014-04-21 ENCOUNTER — Encounter: Payer: Self-pay | Admitting: Physician Assistant

## 2014-04-21 ENCOUNTER — Ambulatory Visit (INDEPENDENT_AMBULATORY_CARE_PROVIDER_SITE_OTHER): Payer: Commercial Managed Care - HMO | Admitting: Physician Assistant

## 2014-04-21 ENCOUNTER — Encounter: Payer: Self-pay | Admitting: Family Medicine

## 2014-04-21 VITALS — BP 140/77 | HR 77 | Ht 65.0 in | Wt 171.0 lb

## 2014-04-21 VITALS — BP 140/78 | Temp 97.6°F | Wt 173.0 lb

## 2014-04-21 DIAGNOSIS — E785 Hyperlipidemia, unspecified: Secondary | ICD-10-CM

## 2014-04-21 DIAGNOSIS — F411 Generalized anxiety disorder: Secondary | ICD-10-CM | POA: Insufficient documentation

## 2014-04-21 DIAGNOSIS — I10 Essential (primary) hypertension: Secondary | ICD-10-CM

## 2014-04-21 DIAGNOSIS — I5032 Chronic diastolic (congestive) heart failure: Secondary | ICD-10-CM

## 2014-04-21 DIAGNOSIS — I2510 Atherosclerotic heart disease of native coronary artery without angina pectoris: Secondary | ICD-10-CM

## 2014-04-21 DIAGNOSIS — R0600 Dyspnea, unspecified: Secondary | ICD-10-CM

## 2014-04-21 MED ORDER — LORAZEPAM 0.5 MG PO TABS: 0.2500 mg | ORAL_TABLET | Freq: Two times a day (BID) | ORAL | Status: DC | PRN

## 2014-04-21 NOTE — Assessment & Plan Note (Addendum)
Post MI when alone. Trial low dose ativan. Follow up within 3 months or sooner if does not help. Seen by cardiology today and not thought cardiac. Does not sound pulmonary as only occurs when alone. Discussed celexa (qt interval ok) as option but patient does not want to take daily medicine and would have risk for serotonin syndrome. Discussed if this becomes daily medicine, would need to change.

## 2014-04-21 NOTE — Progress Notes (Addendum)
[name], MD Phone: [phone]  Subjective:   Michelle [name] is a 80 y.o. year old very pleasant female patient who presents with the following:  Anxiety phq 2 of 0. Since patient got out the hospital, she has had periods of anxiety when she is home alone. She states she has no wheeze and she can still get up and do what she needs to do but she feels like she cannot catch her breath. Feels like she is gasping for air. She has checked her oxygen at it is always above 96%. She is compliant with inhalers. When she gets around someone again, her symptoms resolve. She is mainly afraid of something happening to her when she is alone. She does not have good flexibility for meeting with counseling. Saw cardiology today and they did not think this was cardaic.  ROS- no chest pain, sweating, nausea with these episodes.   Past Medical History- Patient Active Problem List   Diagnosis Date Noted  . Anxiety state 04/21/2014    Priority: High  . Chronic diastolic CHF (congestive heart failure) 03/13/2014    Priority: High  . CAD- RCA PCI '90s, RCA DES 02/18/14 03/05/2014    Priority: High  . STEMI 02/18/14- RCA DES 02/21/2014    Priority: High  . IDDM type 2 03/26/2007    Priority: High  . Pericarditis-post MI (short course of steroids) 03/06/2014    Priority: Medium  . SOB (shortness of breath) 03/05/2014    Priority: Medium  . Hyperlipidemia with target LDL less than 70 03/26/2007    Priority: Medium  . Essential hypertension 04/21/2006    Priority: Medium  . COPD 04/21/2006    Priority: Medium  . Hematoma, rt forearm 02/21/2014    Priority: Low  . Back pain, lumbosacral 10/10/2012    Priority: Low   Medications- reviewed and updated Current Outpatient Prescriptions  Medication Sig Dispense Refill  . albuterol (PROVENTIL) (2.5 MG/3ML) 0.083% nebulizer solution Take 2.5 mg by nebulization 4 (four) times daily as needed for wheezing or shortness of breath.      Marland Kitchen aspirin EC 81 MG  tablet Take 81 mg by mouth daily.      . enalapril (VASOTEC) 20 MG tablet Take 20 mg by mouth daily.      . fenofibrate micronized (LOFIBRA) 134 MG capsule Take 134 mg by mouth daily before breakfast.      . fish oil-omega-3 fatty acids 1000 MG capsule Take 2 g by mouth daily.        . fluticasone (FLONASE) 50 MCG/ACT nasal spray Place 1 spray into both nostrils daily.      . folic acid (FOLVITE) 301 MCG tablet Take 400 mcg by mouth daily.        . furosemide (LASIX) 20 MG tablet Take 20 mg by mouth daily.      . metFORMIN (GLUCOPHAGE) 1000 MG tablet TAKE 1 TABLET BY MOUTH TWO TIMES A DAY  60 tablet  5  . metoprolol succinate (TOPROL-XL) 100 MG 24 hr tablet Take 100 mg by mouth daily. Take with or immediately following a meal.      . pantoprazole (PROTONIX) 40 MG tablet Take 40 mg by mouth daily.      . potassium chloride SA (K-DUR,KLOR-CON) 20 MEQ tablet Take 0.5 tablets (10 mEq total) by mouth daily.      . prasugrel (EFFIENT) 10 MG TABS tablet Take 1 tablet (10 mg total) by mouth daily.  30 tablet  5  . rosuvastatin (  CRESTOR) 40 MG tablet Take 40 mg by mouth daily.      . sodium chloride (OCEAN) 0.65 % SOLN nasal spray Place 1 spray into both nostrils as needed for congestion.      Marland Kitchen tiotropium (SPIRIVA) 18 MCG inhalation capsule Place 18 mcg into inhaler and inhale daily.      . traMADol (ULTRAM) 50 MG tablet Take 50 mg by mouth every 6 (six) hours as needed.      . vitamin E 400 UNIT capsule Take 400 Units by mouth daily.        Marland Kitchen acetaminophen (TYLENOL) 325 MG tablet Take 650 mg by mouth every 6 (six) hours as needed for mild pain.      Marland Kitchen ADVAIR DISKUS 250-50 MCG/DOSE AEPB INHALE 1 PUFF INTO THE LUNGS 2 (TWO) TIMES DAILY.  60 each  1  . LORazepam (ATIVAN) 0.5 MG tablet Take 0.5-1 tablets (0.25-0.5 mg total) by mouth 2 (two) times daily as needed for anxiety.  30 tablet  0  . nitroGLYCERIN (NITROSTAT) 0.4 MG SL tablet Place 0.4 mg under the tongue every 5 (five) minutes as needed for chest  pain.       No current facility-administered medications for this visit.    Objective: BP 140/78  Temp(Src) 97.6 F (36.4 C)  Wt 173 lb (78.472 kg) Gen: NAD, resting comfortably in chair CV: RRR no murmurs rubs or gallops Lungs: CTAB no crackles, wheeze, rhonchi Ext: trace edema Skin: warm, dry, n orash Neuro: grossly normal, moves all extremities  Assessment/Plan:  Anxiety state Post MI when alone. Trial low dose ativan. Follow up within 3 months or sooner if does not help. Seen by cardiology today and not thought cardiac. Does not sound pulmonary as only occurs when alone. Discussed celexa (qt interval ok) as option but patient does not want to take daily medicine and would have risk for serotonin syndrome. Discussed if this becomes daily medicine, would need to change.   Return precautions advised  Meds ordered this encounter  Medications  . LORazepam (ATIVAN) 0.5 MG tablet    Sig: Take 0.5-1 tablets (0.25-0.5 mg total) by mouth 2 (two) times daily as needed for anxiety.    Dispense:  30 tablet    Refill:  0

## 2014-04-21 NOTE — Patient Instructions (Signed)
I agree these episodes are most likely anxiety after your heart attack and recent hospitalizations. You look well today.   lets try 1/2 an ativan pill (if you can split it) as needed since sometimes this only happens once a week. Would want to see you for any refills and if things not working, please come see me as we could consider celexa.

## 2014-04-21 NOTE — Patient Instructions (Signed)
YOU HAVE BEEN GIVEN AN RX FOR BLOOD PRESSURE MACHINE .  YOU WILL NEED TO FOLLOW UP WITH DR. Angelena Form IN 2-3 MONTHS  NO CHANGES WERE MADE TODAY

## 2014-04-21 NOTE — Progress Notes (Signed)
Cardiology Office Note   Date:  04/21/2014   ID:  Michelle [name], DOB 10-09-1932, MRN [id number]  PCP:  [name], MD  Cardiologist:  Dr. Lauree Chandler     History of Present Illness: Michelle [name] is a 80 y.o. female with a hx of CAD s/p PTCA of a large dominant RCA in 1990, DM, HTN, HLD, COPD, former tobacco abuse.    Patient has had several admissions over the past 2 months.  She was admitted with an Inferior STEMI 8/4-8/7. Emergent cardiac catheterization demonstrated an occluded RCA with aneurysm formation at the prior angioplasty site. Aneurysm was treated with a Covered stent. Occlusion was treated with 2 drug-eluting stents (one proximal and one distal to the covered stent).  There was a small pericardial effusion noted post MI.  She was DC on short course of steroids and FU echo demonstrated just trivial effusion.  Readmitted 8/19-8/20 with dyspnea that improved with one dose of IV Lasix.  Echo demonstrated normal LVF and no significant pericardial effusion.  Readmitted 4/33-2/95 with a/c diastolic CHF.  Diuresis was c/b AKI and ACEI was held.  Cr improved at DC.    I saw her 03/19/14 and stopped her Brilinta due to dyspnea and placed her on Effient.   She returns for FU.    She continues to have difficulty with shortness of breath that is overall stable. This seems to be worse when she is left at home by herself. Question if there is a component of anxiety. She denies any wheezing or cough. She denies chest discomfort with exertion. She walks well with home health physical therapy. She denies syncope. She sleeps on 2 pillows chronically. She denies significant LE edema. Mobility is significantly limited by bilateral knee DJD. She ambulates with a walker.   Studies:  - LHC (02/18/14):  Mild disease in the LAD and CFX, mid RCA occluded with aneurysm, EF 55% >>> PCI: Aneurysm treated with a Covered stent; occlusion treated with 2.75 x 20 mm and 3.0 x 8 mm Promus DES  - Echo  (03/05/14):  EF 60-65%, normal wall motion, mild to moderate AI, mild MR, mild LAE, trivial effusion  - Nuclear (9/13):  No ischemia, EF 74%; NORMAL   Recent Labs/Images: 02/19/2014: HDL Cholesterol by NMR 43; LDL (calc) UNABLE TO CALCULATE IF TRIGLYCERIDE OVER 400 mg/dL  03/05/2014: TSH 0.936  03/15/2014: ALT 20  03/17/2014: Hemoglobin 10.9*  03/19/2014: Pro B Natriuretic peptide (BNP) 124.0*  04/10/2014: Creatinine 1.0; Potassium 3.8    Wt Readings from Last 3 Encounters:  04/21/14 171 lb (77.565 kg)  04/10/14 173 lb (78.472 kg)  04/02/14 174 lb (78.926 kg)     Past Medical History  Diagnosis Date  . COPD (chronic obstructive pulmonary disease)   . CAD (coronary artery disease) 1990; 2015    Cardiac cath 1990 with Dr. Lia Foyer and [name] Residual CAD, non obstructive per 2015 cath. STEMI status post stent in August 2015.  Marland Kitchen Hyperlipidemia with target LDL less than 70   . Essential hypertension   . Diabetes mellitus type 2, insulin dependent   . AV block, 1st degree   . 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 Myocardial infarction   . Anginal pain   .  Shortness of breath   . Arthritis     Current Outpatient Prescriptions  Medication Sig Dispense Refill  . acetaminophen (TYLENOL) 325 MG tablet Take 650 mg by mouth every 6 (six) hours as needed for mild pain.      Marland Kitchen ADVAIR DISKUS 250-50 MCG/DOSE AEPB INHALE 1 PUFF INTO THE LUNGS 2 (TWO) TIMES DAILY.  60 each  1  . albuterol (PROVENTIL) (2.5 MG/3ML) 0.083% nebulizer solution Take 2.5 mg by nebulization 4 (four) times daily as needed for wheezing or shortness of breath.      Marland Kitchen aspirin EC 81 MG tablet Take 81 mg by mouth daily.      . enalapril (VASOTEC) 20 MG tablet Take 20 mg by mouth daily.      .  fenofibrate micronized (LOFIBRA) 134 MG capsule Take 134 mg by mouth daily before breakfast.      . fish oil-omega-3 fatty acids 1000 MG capsule Take 2 g by mouth daily.        . fluticasone (FLONASE) 50 MCG/ACT nasal spray Place 1 spray into both nostrils daily.      . folic acid (FOLVITE) 716 MCG tablet Take 400 mcg by mouth daily.        . furosemide (LASIX) 20 MG tablet Take 20 mg by mouth daily.      . metFORMIN (GLUCOPHAGE) 1000 MG tablet TAKE 1 TABLET BY MOUTH TWO TIMES A DAY  60 tablet  5  . metoprolol succinate (TOPROL-XL) 100 MG 24 hr tablet Take 100 mg by mouth daily. Take with or immediately following a meal.      . nitroGLYCERIN (NITROSTAT) 0.4 MG SL tablet Place 0.4 mg under the tongue every 5 (five) minutes as needed for chest pain.      . pantoprazole (PROTONIX) 40 MG tablet Take 40 mg by mouth daily.      . potassium chloride SA (K-DUR,KLOR-CON) 20 MEQ tablet Take 0.5 tablets (10 mEq total) by mouth daily.      . prasugrel (EFFIENT) 10 MG TABS tablet Take 1 tablet (10 mg total) by mouth daily.  30 tablet  5  . rosuvastatin (CRESTOR) 40 MG tablet Take 40 mg by mouth daily.      . sodium chloride (OCEAN) 0.65 % SOLN nasal spray Place 1 spray into both nostrils as needed for congestion.      Marland Kitchen tiotropium (SPIRIVA) 18 MCG inhalation capsule Place 18 mcg into inhaler and inhale daily.      . traMADol (ULTRAM) 50 MG tablet Take 50 mg by mouth every 6 (six) hours as needed.      . vitamin E 400 UNIT capsule Take 400 Units by mouth daily.         No current facility-administered medications for this visit.     Allergies:   Codeine sulfate and Morphine sulfate   Social History:  The patient  reports that she quit smoking about 7 years ago. Her smoking use included Cigarettes. She has a 55 pack-year smoking history. She has never used smokeless tobacco. She reports that she does not drink alcohol or use illicit drugs.   Family History:  The patient's family history includes Cancer  (age of onset: 42) in her father; Heart attack in her son; Heart attack (age of onset: 37) in her mother.   ROS:  Please see the history of present illness.        All other systems reviewed and negative.   PHYSICAL EXAM: VS:  BP 140/77  Pulse 77  Ht 5\' 5"  (1.651 m)  Wt 171 lb (77.565 kg)  BMI 28.46 kg/m2 Well nourished, well developed, in no acute distress HEENT: normal Neck: no JVD Cardiac:  normal S1, S2; RRR; no murmur Lungs:  clear to auscultation bilaterally, no wheezing, rhonchi or rales Abd: soft, nontender, no hepatomegaly Ext: right wrist without hematoma or mass  No LE edema Skin: warm and dry Neuro:  CNs 2-12 intact, no focal abnormalities noted  EKG:  NSR, HR 77, normal axis, no ST changes    ASSESSMENT AND PLAN:  1. Dyspnea:  This is likely multifactorial in the setting of deconditioning, COPD, diastolic CHF, CAD. She currently appears to be stable. No further changes will be made. 2. Atherosclerosis of native coronary artery of native heart without angina pectoris:  No angina. Continue Effient, beta blocker, statin.  3. Essential hypertension:  Controlled.  Her PCP placed her back on Enalapril  4. Hyperlipidemia with target LDL less than 70:  Continue statin. 5. Chronic diastolic CHF (congestive heart failure):  Volume appears stable.  Continue current Rx.  6. COPD:  FU with PCP.   Disposition:  FU Dr. Lauree Chandler in 2-3 mos.    Signed, Versie Starks, MHS 04/21/2014 12:06 PM    [address], [place], [place]  [address] Phone: [phone]; Fax: [phone]

## 2014-04-21 NOTE — Telephone Encounter (Signed)
Tried calling Michelle [name] back but got an automated recording

## 2014-05-07 ENCOUNTER — Telehealth: Payer: Self-pay | Admitting: Cardiovascular Disease

## 2014-05-07 NOTE — Telephone Encounter (Signed)
Spoke with Amy and told her Dr. Angelena Form is back in office today and will complete paperwork

## 2014-05-07 NOTE — Telephone Encounter (Signed)
New Message  Amy with [facility] called requests a call back to discuss status of forms sent over for patient for Dr. Angelena Form to sign

## 2014-05-07 NOTE — Telephone Encounter (Signed)
Completed paperwork given to medical records to fax.

## 2014-05-12 ENCOUNTER — Telehealth: Payer: Self-pay | Admitting: Family Medicine

## 2014-05-12 ENCOUNTER — Other Ambulatory Visit: Payer: Self-pay | Admitting: Internal Medicine

## 2014-05-12 NOTE — Telephone Encounter (Signed)
Patients son, Shanon Brow called and is very concerned about the pain in her knees. Patient had a hard time even getting to the door for the case manager. Son would like a short term amount called in and will try and get her here Friday if you have an opening.

## 2014-05-12 NOTE — Telephone Encounter (Signed)
Dr. Hunter please advise. 

## 2014-05-12 NOTE — Telephone Encounter (Signed)
Can take q6 hours. Discussed mobic risks and plan is to discuss in person tomorrow.

## 2014-05-12 NOTE — Telephone Encounter (Signed)
Michelle [name] can you please schedule pt to come in this week per Dr. Yong Channel?

## 2014-05-12 NOTE — Telephone Encounter (Signed)
I would advise patient to be seen. Mobic is not ideal for her with her kidneys and history of heart attack. We could trial short course but would want to have in person discussion on this. At visit, we could also order home health eval.   Is case worker through [facility]?

## 2014-05-12 NOTE — Telephone Encounter (Signed)
Pt case worker Joni states pt is weak and SOB, lathargic and c/o of pain since being taken off of Mobic, pt thinks Tramadol works better for her which she still has some of and has been taking for arthritis, and she thinks pt needs to be seen but dosent have a way here. Joni feels that pt needs a Emergency planning/management officer. Please advise.

## 2014-05-12 NOTE — Telephone Encounter (Signed)
Pt case manager Raliegh Scarlet called and said pt request rx on [place] she having a lot of pain    Raliegh Scarlet would like a return call [phone]   Pharmacy; [place]

## 2014-05-12 NOTE — Telephone Encounter (Signed)
Son Shanon Brow is going to try and take off work tomorrow am to get pt here. would like to know how many of the tramadol pt can take a day?  pls call [phone]. [phone]

## 2014-05-13 ENCOUNTER — Ambulatory Visit (INDEPENDENT_AMBULATORY_CARE_PROVIDER_SITE_OTHER): Payer: Commercial Managed Care - HMO | Admitting: Family Medicine

## 2014-05-13 ENCOUNTER — Encounter: Payer: Self-pay | Admitting: Family Medicine

## 2014-05-13 VITALS — BP 118/64 | Temp 97.5°F | Wt 167.0 lb

## 2014-05-13 DIAGNOSIS — M170 Bilateral primary osteoarthritis of knee: Secondary | ICD-10-CM

## 2014-05-13 DIAGNOSIS — E119 Type 2 diabetes mellitus without complications: Secondary | ICD-10-CM

## 2014-05-13 DIAGNOSIS — Z794 Long term (current) use of insulin: Secondary | ICD-10-CM

## 2014-05-13 DIAGNOSIS — M1710 Unilateral primary osteoarthritis, unspecified knee: Secondary | ICD-10-CM | POA: Insufficient documentation

## 2014-05-13 DIAGNOSIS — M179 Osteoarthritis of knee, unspecified: Secondary | ICD-10-CM | POA: Insufficient documentation

## 2014-05-13 NOTE — Patient Instructions (Signed)
Let's restart 10 units Lantus each morning. Continue Metformin.  Check your blood sugar each morning. Let me know if you get anything below 90. You can also check later in the day up to once a day (either before a meal or 2 hours after). Bring your log book with you.   You can take tramadol for pain up to every 6 hours. I would take it 3x a day to try to keep the pain level down. Then you can take one overnight if needed. We could consider prednisone for your pain if we get your diabetes under better control.   Check in 7-10 days from now.

## 2014-05-13 NOTE — Assessment & Plan Note (Signed)
Severe pain-increase tramadol to TID. Want to avoid Mobic with CKD as well as recent MI. Wanted to trial prednisone (steroid injection has not benefited patient in the past but willing to trial prednisone). Discussed sports medicine referral if we are able to use prednisone but not beneficial.

## 2014-05-13 NOTE — Progress Notes (Addendum)
[name], MD Phone: [phone]  Subjective:   Michelle [name] is a 80 y.o. year old very pleasant female patient who presents with the following:  Knee Pain/OA- poor control of pain.  Patient having a lot of trouble with her walker walking due to knee pain. Taking tramadol twice a day. Wants to take mobic as well. Pain even at rest. Has had a steroid injection before which states did not help. X-rays several year ago show degenerative changes to knee. Left elbow also hurts but she bumped into something and not a long temr issue like her knees.  ROS- no hot/swollen joint. Nohistory of gout. No fever/chills. No locking feeling. Does hear some popping.   When considering prednisone short term for pain, we discussed Diabetes Mellitus which is poorly controlled. Patient is having CBGs in 200s fasting on regular basis when previously much better controlled on metformin alone (had been on lantus and amaryl before but after hospitalization had reasonable CBGs on metformin alone).  ROS-no hypoglycemia  Past Medical History- Patient Active Problem List   Diagnosis Date Noted  . Anxiety state 04/21/2014    Priority: High  . Chronic diastolic CHF (congestive heart failure) 03/13/2014    Priority: High  . CAD- RCA PCI '90s, RCA DES 02/18/14 03/05/2014    Priority: High  . STEMI 02/18/14- RCA DES 02/21/2014    Priority: High  . IDDM type 2 03/26/2007    Priority: High  . Osteoarthritis, knee 05/13/2014    Priority: Medium  . Pericarditis-post MI (short course of steroids) 03/06/2014    Priority: Medium  . SOB (shortness of breath) 03/05/2014    Priority: Medium  . Hyperlipidemia with target LDL less than 70 03/26/2007    Priority: Medium  . Essential hypertension 04/21/2006    Priority: Medium  . COPD 04/21/2006    Priority: Medium  . Hematoma, rt forearm 02/21/2014    Priority: Low  . Back pain, lumbosacral 10/10/2012    Priority: Low   Medications- reviewed and updated Current  Outpatient Prescriptions  Medication Sig Dispense Refill  . acetaminophen (TYLENOL) 325 MG tablet Take 650 mg by mouth every 6 (six) hours as needed for mild pain.      Marland Kitchen aspirin EC 81 MG tablet Take 81 mg by mouth daily.      . enalapril (VASOTEC) 20 MG tablet Take 20 mg by mouth daily.      . fenofibrate micronized (LOFIBRA) 134 MG capsule Take 134 mg by mouth daily before breakfast.      . fish oil-omega-3 fatty acids 1000 MG capsule Take 2 g by mouth daily.        . folic acid (FOLVITE) 098 MCG tablet Take 400 mcg by mouth daily.        . furosemide (LASIX) 20 MG tablet Take 20 mg by mouth daily.      Marland Kitchen LORazepam (ATIVAN) 0.5 MG tablet Take 0.5-1 tablets (0.25-0.5 mg total) by mouth 2 (two) times daily as needed for anxiety.  30 tablet  0  . metFORMIN (GLUCOPHAGE) 1000 MG tablet TAKE 1 TABLET BY MOUTH TWO TIMES A DAY  60 tablet  5  . metoprolol succinate (TOPROL-XL) 100 MG 24 hr tablet Take 100 mg by mouth daily. Take with or immediately following a meal.      . pantoprazole (PROTONIX) 40 MG tablet Take 40 mg by mouth daily.      . potassium chloride SA (K-DUR,KLOR-CON) 20 MEQ tablet Take 0.5 tablets (10 mEq total)  by mouth daily.      . prasugrel (EFFIENT) 10 MG TABS tablet Take 1 tablet (10 mg total) by mouth daily.  30 tablet  5  . rosuvastatin (CRESTOR) 40 MG tablet Take 40 mg by mouth daily.      . sodium chloride (OCEAN) 0.65 % SOLN nasal spray Place 1 spray into both nostrils as needed for congestion.      Marland Kitchen tiotropium (SPIRIVA) 18 MCG inhalation capsule Place 18 mcg into inhaler and inhale daily.      . vitamin E 400 UNIT capsule Take 400 Units by mouth daily.        Marland Kitchen ADVAIR DISKUS 250-50 MCG/DOSE AEPB INHALE 1 PUFF INTO THE LUNGS 2 (TWO) TIMES DAILY.  60 each  1  . albuterol (PROVENTIL) (2.5 MG/3ML) 0.083% nebulizer solution Take 2.5 mg by nebulization 4 (four) times daily as needed for wheezing or shortness of breath.      Marland Kitchen albuterol (PROVENTIL) (2.5 MG/3ML) 0.083% nebulizer  solution INHALE 1 VIAL USING NEBULIZER FOUR TIMES A DAY AS NEEDED  150 mL  1  . fluticasone (FLONASE) 50 MCG/ACT nasal spray Place 1 spray into both nostrils daily.      . nitroGLYCERIN (NITROSTAT) 0.4 MG SL tablet Place 0.4 mg under the tongue every 5 (five) minutes as needed for chest pain.      . traMADol (ULTRAM) 50 MG tablet Take 50 mg by mouth every 6 (six) hours as needed.       No current facility-administered medications for this visit.    Objective: BP 118/64  Temp(Src) 97.5 F (36.4 C)  Wt 167 lb (75.751 kg) Gen: NAD, resting comfortably in wheelchair Ext: no edema MSK: bilateral knees without obvious effusion by palpation.She has medial and lateral tenderness on joint line. On the left knee where my finger is pointing in the picture there feels to be a loose body perhaps 1 cm by 0.5 cm (seems to correspond to x-rays previously).  Skin: warm, dry, no rash Neuro: grossly normal, moves all extremities    Assessment/Plan:  IDDM type 2 Poor control with fasting CBGs in 200s off of lantus and amaryl. Continue metformin. Restart Lantus and follow closely for hypoglycemia-advised warning signs to call our office. Hopeful to get better DM control before considering prednisone for knees.   Osteoarthritis, knee Severe pain-increase tramadol to TID. Want to avoid Mobic with CKD as well as recent MI. Wanted to trial prednisone (steroid injection has not benefited patient in the past but willing to trial prednisone). Discussed sports medicine referral if we are able to use prednisone but not beneficial.    Return precautions advised.

## 2014-05-13 NOTE — Assessment & Plan Note (Signed)
Poor control with fasting CBGs in 200s off of lantus and amaryl. Continue metformin. Restart Lantus and follow closely for hypoglycemia-advised warning signs to call our office. Hopeful to get better DM control before considering prednisone for knees.

## 2014-05-20 ENCOUNTER — Ambulatory Visit (INDEPENDENT_AMBULATORY_CARE_PROVIDER_SITE_OTHER): Payer: Commercial Managed Care - HMO | Admitting: Family Medicine

## 2014-05-20 ENCOUNTER — Encounter: Payer: Self-pay | Admitting: Family Medicine

## 2014-05-20 VITALS — BP 134/80 | HR 74 | Temp 97.3°F | Wt 168.0 lb

## 2014-05-20 DIAGNOSIS — E119 Type 2 diabetes mellitus without complications: Secondary | ICD-10-CM

## 2014-05-20 DIAGNOSIS — Z794 Long term (current) use of insulin: Secondary | ICD-10-CM

## 2014-05-20 DIAGNOSIS — R0602 Shortness of breath: Secondary | ICD-10-CM

## 2014-05-20 NOTE — Progress Notes (Signed)
[name], MD Phone: [phone]  Subjective:   Michelle [name] is a 80 y.o. year old very pleasant female patient who presents with the following:  Diabetes- poor but improving control  Started Lantus 10 units taking in the morning.  CBGs in the morning 130s to 160s and in the PM 149-156.  Down from 200s fasting 1 week ago.  No low blood sugar symptoms ROS- no blurry vision. No polyuria.   Shortness of breath Weight has been stable at home at 169. Sleeping on 2 pillows  With no recent increase.  Mild SOB at present. More SOB this AM and took albuterol and started feeling better.  ROS- no chest pain or PND  Past Medical History- Patient Active Problem List   Diagnosis Date Noted  . Anxiety state 04/21/2014    Priority: High  . Chronic diastolic CHF (congestive heart failure) 03/13/2014    Priority: High  . CAD- RCA PCI '90s, RCA DES 02/18/14 03/05/2014    Priority: High  . STEMI 02/18/14- RCA DES 02/21/2014    Priority: High  . IDDM type 2 03/26/2007    Priority: High  . Pericarditis-post MI (short course of steroids) 03/06/2014    Priority: Medium  . SOB (shortness of breath) 03/05/2014    Priority: Medium  . Hyperlipidemia with target LDL less than 70 03/26/2007    Priority: Medium  . Essential hypertension 04/21/2006    Priority: Medium  . COPD 04/21/2006    Priority: Medium  . Osteoarthritis, knee 05/13/2014    Priority: Low  . Hematoma, rt forearm 02/21/2014    Priority: Low  . Back pain, lumbosacral 10/10/2012    Priority: Low   Medications- reviewed and updated Current Outpatient Prescriptions  Medication Sig Dispense Refill  . acetaminophen (TYLENOL) 325 MG tablet Take 650 mg by mouth every 6 (six) hours as needed for mild pain.    Marland Kitchen ADVAIR DISKUS 250-50 MCG/DOSE AEPB INHALE 1 PUFF INTO THE LUNGS 2 (TWO) TIMES DAILY. 60 each 1  . albuterol (PROVENTIL) (2.5 MG/3ML) 0.083% nebulizer solution Take 2.5 mg by nebulization 4 (four) times daily as needed for  wheezing or shortness of breath.    Marland Kitchen albuterol (PROVENTIL) (2.5 MG/3ML) 0.083% nebulizer solution INHALE 1 VIAL USING NEBULIZER FOUR TIMES A DAY AS NEEDED 150 mL 1  . aspirin EC 81 MG tablet Take 81 mg by mouth daily.    . enalapril (VASOTEC) 20 MG tablet Take 20 mg by mouth daily.    . fenofibrate micronized (LOFIBRA) 134 MG capsule Take 134 mg by mouth daily before breakfast.    . fish oil-omega-3 fatty acids 1000 MG capsule Take 2 g by mouth daily.      . fluticasone (FLONASE) 50 MCG/ACT nasal spray Place 1 spray into both nostrils daily.    . folic acid (FOLVITE) 166 MCG tablet Take 400 mcg by mouth daily.      . furosemide (LASIX) 20 MG tablet Take 20 mg by mouth daily.    Marland Kitchen LORazepam (ATIVAN) 0.5 MG tablet Take 0.5-1 tablets (0.25-0.5 mg total) by mouth 2 (two) times daily as needed for anxiety. 30 tablet 0  . metFORMIN (GLUCOPHAGE) 1000 MG tablet TAKE 1 TABLET BY MOUTH TWO TIMES A DAY 60 tablet 5  . metoprolol succinate (TOPROL-XL) 100 MG 24 hr tablet Take 100 mg by mouth daily. Take with or immediately following a meal.    . nitroGLYCERIN (NITROSTAT) 0.4 MG SL tablet Place 0.4 mg under the tongue every 5 (five)  minutes as needed for chest pain.    . pantoprazole (PROTONIX) 40 MG tablet Take 40 mg by mouth daily.    . potassium chloride SA (K-DUR,KLOR-CON) 20 MEQ tablet Take 0.5 tablets (10 mEq total) by mouth daily.    . prasugrel (EFFIENT) 10 MG TABS tablet Take 1 tablet (10 mg total) by mouth daily. 30 tablet 5  . rosuvastatin (CRESTOR) 40 MG tablet Take 40 mg by mouth daily.    . sodium chloride (OCEAN) 0.65 % SOLN nasal spray Place 1 spray into both nostrils as needed for congestion.    Marland Kitchen tiotropium (SPIRIVA) 18 MCG inhalation capsule Place 18 mcg into inhaler and inhale daily.    . traMADol (ULTRAM) 50 MG tablet Take 50 mg by mouth every 6 (six) hours as needed.    . vitamin E 400 UNIT capsule Take 400 Units by mouth daily.      . furosemide (LASIX) 40 MG tablet     . PROAIR HFA  108 (90 BASE) MCG/ACT inhaler      No current facility-administered medications for this visit.    Objective: BP 134/80 mmHg  Pulse 74  Temp(Src) 97.3 F (36.3 C) (Oral)  Wt 168 lb (76.204 kg)  SpO2 98% Gen: NAD, resting comfortably in wheelchair CV: RRR no murmurs rubs or gallops, no JVD Lungs: occasional diffuse wheeze. Otherwise no crackles or rales. Abdomen: soft/nontender/nondistended/normal bowel sounds.  Ext: slight/trace edema unchanged from previous Skin: warm, dry, no rash Neuro: grossly normal, moves all extremities  Assessment/Plan:  IDDM type 2 Poor control but improving with fasting CBGS in 130-170 range down from 200s. Patient with hypoglycemia in past so will hold off on further changes until next a1c. Continue Lantus 10 units as well as metformin. Follow up 1 month.   SOB (shortness of breath) Likely multifactorial. From CHF perspective-appears stable and euvolemic with stable weight. From COPD perspective, improved with albuterol this AM and slight wheeeze on exam today. Discussed treating with albuterol every 6 hours for 24 hours. Patient to return if does not improve. If it improves we are going to follow-up in one month. She appears very comfortable on exam with no increased work of breathing and sats are 98%. She has no chest pain. Patient also can try low-dose Ativan as likely an anxiety component as well  Return precautions advised.

## 2014-05-20 NOTE — Patient Instructions (Addendum)
Take albuterol every 6 hours for next 24 hours or so. If your symptoms worsened or did not improve, please come see [place] again. You can also try a low dose of the anxiety medicine to see if that helps. You looked very comfortable but the only thing I heard was a slight wheeze.   Continue Lantus 10 units. Continue to log your blood sugars.   Follow up in 4 weeks unless as above in relation to shortness of breath.

## 2014-05-20 NOTE — Progress Notes (Signed)
Pre visit review using our clinic review tool, if applicable. No additional management support is needed unless otherwise documented below in the visit note. 

## 2014-05-20 NOTE — Assessment & Plan Note (Signed)
Likely multifactorial. From CHF perspective-appears stable and euvolemic with stable weight. From COPD perspective, improved with albuterol this AM and slight wheeeze on exam today. Discussed treating with albuterol every 6 hours for 24 hours. Patient to return if does not improve. If it improves we are going to follow-up in one month. She appears very comfortable on exam with no increased work of breathing and sats are 98%. She has no chest pain. Patient also can try low-dose Ativan as likely an anxiety component as well

## 2014-05-20 NOTE — Assessment & Plan Note (Signed)
Poor control but improving with fasting CBGS in 130-170 range down from 200s. Patient with hypoglycemia in past so will hold off on further changes until next a1c. Continue Lantus 10 units as well as metformin. Follow up 1 month.

## 2014-05-23 ENCOUNTER — Telehealth: Payer: Self-pay | Admitting: Family Medicine

## 2014-05-23 NOTE — Telephone Encounter (Signed)
Is this ok to refill?  

## 2014-05-23 NOTE — Telephone Encounter (Signed)
yes

## 2014-05-23 NOTE — Telephone Encounter (Signed)
Michelle [name], Michelle [name] is requesting re-fill on LORazepam (ATIVAN) 0.5 MG tablet

## 2014-05-24 MED ORDER — LORAZEPAM 0.5 MG PO TABS: 0.2500 mg | ORAL_TABLET | Freq: Two times a day (BID) | ORAL | Status: DC | PRN

## 2014-05-24 NOTE — Telephone Encounter (Signed)
Medication called in 

## 2014-05-29 ENCOUNTER — Telehealth: Payer: Self-pay | Admitting: Family Medicine

## 2014-05-29 MED ORDER — ALBUTEROL SULFATE HFA 108 (90 BASE) MCG/ACT IN AERS: 1.0000 | INHALATION_SPRAY | RESPIRATORY_TRACT | Status: DC | PRN

## 2014-05-29 NOTE — Telephone Encounter (Signed)
[place], [place] is requesting re-fill on PROAIR HFA 108 (90 BASE) MCG/ACT inhaler

## 2014-05-29 NOTE — Telephone Encounter (Signed)
Medication refilled

## 2014-06-03 ENCOUNTER — Telehealth: Payer: Self-pay | Admitting: Family Medicine

## 2014-06-03 ENCOUNTER — Other Ambulatory Visit: Payer: Self-pay | Admitting: Internal Medicine

## 2014-06-03 NOTE — Telephone Encounter (Signed)
Pt request refill of the following: LORazepam (ATIVAN) 0.5 MG tablet    Her rx called for 1 pill 2 times a day and was told if needed she could take a third pill and that is what she has done several times.   LORazepam (ATIVAN) 0.5 MG tablet     Pharmacy; [facility]

## 2014-06-04 NOTE — Telephone Encounter (Signed)
Is this ok to refill?  

## 2014-06-04 NOTE — Telephone Encounter (Signed)
Medication refilled

## 2014-06-04 NOTE — Telephone Encounter (Signed)
Pt is out °

## 2014-06-04 NOTE — Telephone Encounter (Signed)
I discussed with patient this is not to be a daily medicine. If she is taking this 3x a day or even twice, she needs to come in so we can start alternative. Refill #30 x 1 and if this does not last a month then patient needs to be seen before any further refills.

## 2014-06-09 ENCOUNTER — Telehealth: Payer: Self-pay | Admitting: Family Medicine

## 2014-06-09 ENCOUNTER — Other Ambulatory Visit: Payer: Self-pay | Admitting: Internal Medicine

## 2014-06-09 NOTE — Telephone Encounter (Signed)
Patient Information:  Caller Name: [place]  Phone: [phone]  Patient: Michelle [name], Michelle [name]  Gender: Female  DOB: April 23, 1933  Age: 80 Years  PCP: [name]  Office Follow Up:  Does the office need to follow up with this patient?: Yes  Instructions For The Office: Please call back regarding disposition to be seen today and pt refusal to see anyone other than Dr.Hunter.  RN Note: FBS 185.  Denies coughing, ankle edema or weight gain.  Not eating well; drinks a lot of water.  No appointments remain at [facility].  Pt declined to go to another office . Wants only to be seen by Dr Yong Channel.  RN spoke with pt's son and explained reasons for being seen by any  provider today and again pt refused to see anyone other than Dr. Yong Channel so no appointment has been scheduled.  Pt is willing to go to ED to be treated by another provider but unwilling to see anyone in the office other than Dr Yong Channel.  Message sent to office staff for call back.    Symptoms  Reason For Call & Symptoms: "I can't breathe well" since MI 8/15.  Shortness of breath worsened 06/08/14.  Denies angina, diaphoresis or nausea.  Reviewed Health History In EMR: Yes  Reviewed Medications In EMR: Yes  Reviewed Allergies In EMR: Yes  Reviewed Surgeries / Procedures: Yes  Date of Onset of Symptoms: 06/08/2014  Treatments Tried: Albuterol, Lasix  Treatments Tried Worked: No  Guideline(s) Used:  Breathing Difficulty  Asthma Attack  Disposition Per Guideline:   See Today in Office  Reason For Disposition Reached:   Asthma medicine (nebulizer or inhaler) is needed more frequently than every 4 hours to keep you comfortable  Advice Given:  Quick-Relief Asthma Medicine:   Start your quick-relief medicine (e.g., albuterol, salbutamol) at the first sign of any coughing or shortness of breath (don't wait for wheezing). Use your inhaler (2 puffs each time) or nebulizer every 4 hours. Continue the quick-relief medicine until you have  not wheezed or coughed for 48 hours.  Drinking Liquids:  Try to drink normal amount of liquids (e.g., water). Being adequately hydrated makes it easier to cough up the sticky lung mucus.  Humidifier:   If the air is dry, use a cool mist humidifier to prevent drying of the upper airway.  Expected Course:  If treatment is started early, most asthma attacks are quickly brought under control. All wheezing should be gone by 5 days.  Call Back If:  Inhaled asthma medicine (nebulizer or inhaler) is needed more often than every 4 hours  Wheezing has not completely cleared after 5 days  You become worse.  Patient Will Follow Care Advice:  YES

## 2014-06-09 NOTE — Telephone Encounter (Signed)
Pt states she doesn't want to come in, is not out of anxiety medication and her oxygen level %98. Pt states she will take anxiety meds and give [place] an update after that on how she feels

## 2014-06-09 NOTE — Telephone Encounter (Signed)
yes

## 2014-06-09 NOTE — Telephone Encounter (Signed)
Medication faxed to [facility]

## 2014-06-09 NOTE — Telephone Encounter (Signed)
Keba-can you see if patient has run out anti anxiety medicine? I believe she is able to check her oxygen level at home as well-can she check that for [place]? If symptoms do not improve with anxiety medicine, can either have her come in at 4:30 or go to ER/ED.

## 2014-06-09 NOTE — Telephone Encounter (Signed)
Is this ok to refill?  

## 2014-06-09 NOTE — Telephone Encounter (Signed)
I believe this is an Pharmacist, hospital for you

## 2014-06-11 ENCOUNTER — Telehealth: Payer: Self-pay | Admitting: Family Medicine

## 2014-06-11 MED ORDER — FLUTICASONE-SALMETEROL 250-50 MCG/DOSE IN AEPB: 1.0000 | INHALATION_SPRAY | Freq: Every day | RESPIRATORY_TRACT | Status: DC | PRN

## 2014-06-11 NOTE — Telephone Encounter (Signed)
Patient needs re-fill on ADVAIR DISKUS 250-50 MCG/DOSE AEPB

## 2014-06-11 NOTE — Telephone Encounter (Signed)
Medication refilled

## 2014-06-18 ENCOUNTER — Other Ambulatory Visit: Payer: Self-pay | Admitting: Family Medicine

## 2014-06-18 MED ORDER — ALBUTEROL SULFATE (2.5 MG/3ML) 0.083% IN NEBU: INHALATION_SOLUTION | RESPIRATORY_TRACT | Status: DC

## 2014-06-18 MED ORDER — ALBUTEROL SULFATE HFA 108 (90 BASE) MCG/ACT IN AERS: 1.0000 | INHALATION_SPRAY | RESPIRATORY_TRACT | Status: DC | PRN

## 2014-06-18 NOTE — Telephone Encounter (Signed)
Son is aware and says thanks you!!!

## 2014-06-18 NOTE — Telephone Encounter (Signed)
Pt will be out of her albuterol (PROVENTIL) (2.5 MG/3ML) 0.083% nebulizer solution Dr hunter has pt using 4 x day.  Can you call asap  [place] [facility] pls call when ready.

## 2014-06-18 NOTE — Telephone Encounter (Signed)
Ok per Dr Yong Channel, rx sent in electronically

## 2014-06-20 ENCOUNTER — Ambulatory Visit (INDEPENDENT_AMBULATORY_CARE_PROVIDER_SITE_OTHER): Payer: Commercial Managed Care - HMO | Admitting: Family Medicine

## 2014-06-20 ENCOUNTER — Encounter: Payer: Self-pay | Admitting: Family Medicine

## 2014-06-20 VITALS — BP 160/70 | HR 86 | Temp 98.6°F | Wt 168.0 lb

## 2014-06-20 DIAGNOSIS — M170 Bilateral primary osteoarthritis of knee: Secondary | ICD-10-CM

## 2014-06-20 DIAGNOSIS — I10 Essential (primary) hypertension: Secondary | ICD-10-CM

## 2014-06-20 DIAGNOSIS — Z794 Long term (current) use of insulin: Secondary | ICD-10-CM

## 2014-06-20 DIAGNOSIS — E119 Type 2 diabetes mellitus without complications: Secondary | ICD-10-CM

## 2014-06-20 DIAGNOSIS — F411 Generalized anxiety disorder: Secondary | ICD-10-CM

## 2014-06-20 MED ORDER — SERTRALINE HCL 25 MG PO TABS: 25.0000 mg | ORAL_TABLET | Freq: Every day | ORAL | Status: DC

## 2014-06-20 MED ORDER — ENALAPRIL MALEATE 20 MG PO TABS: 20.0000 mg | ORAL_TABLET | Freq: Two times a day (BID) | ORAL | Status: DC

## 2014-06-20 MED ORDER — INSULIN GLARGINE 100 UNIT/ML SOLOSTAR PEN: PEN_INJECTOR | SUBCUTANEOUS | Status: DC

## 2014-06-20 MED ORDER — LORAZEPAM 0.5 MG PO TABS: 0.2500 mg | ORAL_TABLET | Freq: Every day | ORAL | Status: DC | PRN

## 2014-06-20 NOTE — Assessment & Plan Note (Addendum)
Fasting CBGs between 140 and 160. We will titrate Lantus up to 11 units and continue metformin 2 g daily. Check A1c today is less than 8 will consider prednisone 20 mg 7 days for her osteoarthritis

## 2014-06-20 NOTE — Assessment & Plan Note (Signed)
Low-dose Ativan has become an almost daily or sometimes more than once daily medication. We will try to titrate her up on an SSRI to see if this is beneficial to her (hopeful she can decrease need for ativan). Follow-up one month with repeat BMET to make sure there is no hyponatremia which is a risk at her age.

## 2014-06-20 NOTE — Patient Instructions (Addendum)
Try a low dose zoloft to see if that cuts down your need for ativan. We will likely go up to 50mg  next visit. Try to cut your ativan down to every other day after about a week. If you have any thoughts of hurting yourself, call us immediately.   For your joint pain, call in prednisone if a1c is less than 8.   Increase Lantus to 11 units daily.   Increase enalapril to BID.   Have [facility] contact us for your prescriptions in 90 day supply.

## 2014-06-20 NOTE — Assessment & Plan Note (Signed)
Poor control. Increase enalapril to 20 mg twice a day. Continue Lasix 20 mg, metoprolol 100 mg extended release. She will follow up with cardiology in 2 weeks and they will recheck at that time to see if she needs further titration.

## 2014-06-20 NOTE — Progress Notes (Signed)
[name], MD Phone: [phone]  Subjective:   Michelle [name] is a 80 y.o. year old very pleasant female patient who presents with the following:  Diabetes- poor but improving control  Taking Lantus 10 units taking in the morning. Metformin 1g BID.  CBGs in the morning  From 140 to 160 mainly. Before lantus was >200.   No low blood sugar symptoms ROS- no blurry vision. No polyuria other than on diuretic.   Osteoarthritis- Patient reports diffuse joint pain mainly in her knees and shoulders but at times in her elbows as well. Given multiple joints we had  consider prednisone or steroid injectionBut her CBGs at that time greater than 200 in the morning  Hypertension-Poor control on enalapril 20mg , lasix 20mg , metoprolol 100mg  24 hr  BP Readings from Last 3 Encounters:  06/20/14 160/70  05/20/14 134/80  05/13/14 118/64  Home BP monitoring-no Compliant with medications-yes without side effects ROS-Denies any CP, HA,  blurry vision  Anxiety-poor control  Patient to be very anxious especially when she is home alone But she also worries about multiple different things. We had a trial of low-dose Ativan to be used sparingly because patient did not want to take the medication every day but she is starting to take Ativan almost every single day. We discussed the need to transition at this point to an SSRI to try to better control her symptoms. ROS-no SI HI, denies depressive symptoms  Past Medical History- Patient Active Problem List   Diagnosis Date Noted  . Anxiety state 04/21/2014    Priority: High  . Chronic diastolic CHF (congestive heart failure) 03/13/2014    Priority: High  . CAD- RCA PCI '90s, RCA DES 02/18/14 03/05/2014    Priority: High  . STEMI 02/18/14- RCA DES 02/21/2014    Priority: High  . IDDM type 2 03/26/2007    Priority: High  . Pericarditis-post MI (short course of steroids) 03/06/2014    Priority: Medium  . SOB (shortness of breath) 03/05/2014   Priority: Medium  . Hyperlipidemia with target LDL less than 70 03/26/2007    Priority: Medium  . Essential hypertension 04/21/2006    Priority: Medium  . COPD 04/21/2006    Priority: Medium  . Osteoarthritis, knee 05/13/2014    Priority: Low  . Hematoma, rt forearm 02/21/2014    Priority: Low  . Back pain, lumbosacral 10/10/2012    Priority: Low   Medications- reviewed and updated Current Outpatient Prescriptions  Medication Sig Dispense Refill  . albuterol (PROVENTIL) (2.5 MG/3ML) 0.083% nebulizer solution INHALE 1 VIAL USING NEBULIZER FOUR TIMES A DAY AS NEEDED 150 mL 1  . aspirin EC 81 MG tablet Take 81 mg by mouth daily.    . B-D ULTRAFINE III SHORT PEN 31G X 8 MM MISC USE WITH LANTUS SOLOSTAR 100 each 0  . enalapril (VASOTEC) 20 MG tablet Take 1 tablet (20 mg total) by mouth 2 (two) times daily. 60 tablet 1  . fenofibrate micronized (LOFIBRA) 134 MG capsule Take 134 mg by mouth daily before breakfast.    . fish oil-omega-3 fatty acids 1000 MG capsule Take 2 g by mouth daily.      . Fluticasone-Salmeterol (ADVAIR DISKUS) 250-50 MCG/DOSE AEPB Inhale 1 puff into the lungs daily as needed. 60 each 1  . folic acid (FOLVITE) 376 MCG tablet Take 400 mcg by mouth daily.      . furosemide (LASIX) 20 MG tablet Take 20 mg by mouth daily.    . metFORMIN (GLUCOPHAGE)  1000 MG tablet TAKE 1 TABLET BY MOUTH TWO TIMES A DAY 60 tablet 5  . metoprolol succinate (TOPROL-XL) 100 MG 24 hr tablet Take 100 mg by mouth daily. Take with or immediately following a meal.    . nitroGLYCERIN (NITROSTAT) 0.4 MG SL tablet Place 0.4 mg under the tongue every 5 (five) minutes as needed for chest pain.    . pantoprazole (PROTONIX) 40 MG tablet Take 40 mg by mouth daily.    . potassium chloride SA (K-DUR,KLOR-CON) 20 MEQ tablet Take 0.5 tablets (10 mEq total) by mouth daily.    . prasugrel (EFFIENT) 10 MG TABS tablet Take 1 tablet (10 mg total) by mouth daily. 30 tablet 5  . rosuvastatin (CRESTOR) 40 MG tablet  Take 40 mg by mouth daily.    . sodium chloride (OCEAN) 0.65 % SOLN nasal spray Place 1 spray into both nostrils as needed for congestion.    Marland Kitchen tiotropium (SPIRIVA) 18 MCG inhalation capsule Place 18 mcg into inhaler and inhale daily.    . vitamin E 400 UNIT capsule Take 400 Units by mouth daily.      Marland Kitchen acetaminophen (TYLENOL) 325 MG tablet Take 650 mg by mouth every 6 (six) hours as needed for mild pain.    Marland Kitchen albuterol (PROAIR HFA) 108 (90 BASE) MCG/ACT inhaler Inhale 1 puff into the lungs every 4 (four) hours as needed for wheezing or shortness of breath. (Patient not taking: Reported on 06/20/2014) 18 g 5  . fluticasone (FLONASE) 50 MCG/ACT nasal spray Place 1 spray into both nostrils daily.    . Insulin Glargine (LANTUS SOLOSTAR) 100 UNIT/ML Solostar Pen INJECT 10-15 UNITS INTO THE SKIN Daily 15 mL 11  . LORazepam (ATIVAN) 0.5 MG tablet Take 0.5-1 tablets (0.25-0.5 mg total) by mouth daily as needed for anxiety. 30 tablet 0  . sertraline (ZOLOFT) 25 MG tablet Take 1 tablet (25 mg total) by mouth daily. 30 tablet 2  . traMADol (ULTRAM) 50 MG tablet TAKE 1 TABLET BY MOUTH EVERY 6 HOURS AS NEEDED FOR MODERATE PAIN (Patient not taking: Reported on 06/20/2014) 30 tablet 0   No current facility-administered medications for this visit.    Objective: BP 160/70 mmHg  Pulse 86  Temp(Src) 98.6 F (37 C)  Wt 168 lb (76.204 kg)  SpO2 95% Gen: NAD, resting comfortably in wheelchair CV: RRR no murmurs rubs or gallops, no JVD Lungs: clear, no wheeze. Otherwise no crackles or rales. Abdomen: soft/nontender/nondistended/normal bowel sounds.  Ext: slight/trace edema unchanged from previous Skin: warm, dry, no rash Neuro: grossly normal, moves all extremities   Assessment/Plan:  IDDM type 2 Fasting CBGs between 140 and 160. We will titrate Lantus up to 11 units and continue metformin 2 g daily. Check A1c today is less than 8 will consider prednisone 20 mg 7 days for her osteoarthritis  Essential  hypertension Poor control. Increase enalapril to 20 mg twice a day. Continue Lasix 20 mg, metoprolol 100 mg extended release. She will follow up with cardiology in 2 weeks and they will recheck at that time to see if she needs further titration.  Anxiety state Low-dose Ativan has become an almost daily or sometimes more than once daily medication. We will try to titrate her up on an SSRI to see if this is beneficial to her (hopeful she can decrease need for ativan). Follow-up one month with repeat BMET to make sure there is no hyponatremia which is a risk at her age.   Osteoarthritis, knee She has pain  in several joints including her knees and shoulders which seem to be worse areas of pain. We will trial prednisone 20 mg 7 days to see if this benefits her if her A1c is less than 8.  Return precautions advised.   Orders Placed This Encounter  Procedures  . Hemoglobin A1c   Meds ordered this encounter  Medications  . sertraline (ZOLOFT) 25 MG tablet    Sig: Take 1 tablet (25 mg total) by mouth daily.    Dispense:  30 tablet    Refill:  2  . LORazepam (ATIVAN) 0.5 MG tablet    Sig: Take 0.5-1 tablets (0.25-0.5 mg total) by mouth daily as needed for anxiety.    Dispense:  30 tablet    Refill:  0  . Insulin Glargine (LANTUS SOLOSTAR) 100 UNIT/ML Solostar Pen    Sig: INJECT 10-15 UNITS INTO THE SKIN Daily    Dispense:  15 mL    Refill:  11  . enalapril (VASOTEC) 20 MG tablet    Sig: Take 1 tablet (20 mg total) by mouth 2 (two) times daily.    Dispense:  60 tablet    Refill:  1

## 2014-06-20 NOTE — Assessment & Plan Note (Signed)
She has pain in several joints including her knees and shoulders which seem to be worse areas of pain. We will trial prednisone 20 mg 7 days to see if this benefits her if her A1c is less than 8.

## 2014-06-21 LAB — HEMOGLOBIN A1C: HEMOGLOBIN A1C: 7.5 % — AB (ref 4.6–6.5)

## 2014-06-24 MED ORDER — PREDNISONE 20 MG PO TABS: 20.0000 mg | ORAL_TABLET | Freq: Every day | ORAL | Status: DC

## 2014-06-24 NOTE — Addendum Note (Signed)
Addended by: Clyde Lundborg A on: 06/24/2014 11:44 AM   Modules accepted: Orders

## 2014-06-26 ENCOUNTER — Encounter (HOSPITAL_COMMUNITY): Payer: Self-pay | Admitting: Interventional Cardiology

## 2014-07-03 ENCOUNTER — Ambulatory Visit (INDEPENDENT_AMBULATORY_CARE_PROVIDER_SITE_OTHER): Payer: Commercial Managed Care - HMO | Admitting: Cardiovascular Disease

## 2014-07-03 ENCOUNTER — Encounter: Payer: Self-pay | Admitting: Cardiovascular Disease

## 2014-07-03 VITALS — BP 122/68 | HR 82 | Ht 65.0 in | Wt 162.0 lb

## 2014-07-03 DIAGNOSIS — I10 Essential (primary) hypertension: Secondary | ICD-10-CM

## 2014-07-03 DIAGNOSIS — I2510 Atherosclerotic heart disease of native coronary artery without angina pectoris: Secondary | ICD-10-CM

## 2014-07-03 DIAGNOSIS — E785 Hyperlipidemia, unspecified: Secondary | ICD-10-CM

## 2014-07-03 DIAGNOSIS — I5032 Chronic diastolic (congestive) heart failure: Secondary | ICD-10-CM

## 2014-07-03 NOTE — Progress Notes (Signed)
History of Present Illness: 80 y.o. female with a hx of diastolic CHF, CAD s/p PTCA of a large dominant RCA in 1990 and inferior STEMI August 2015, DM, HTN, HLD, COPD, former tobacco abuse here today for cardiac follow up. She was admitted with an Inferior STEMI 8/4-02/21/14. Emergent cardiac catheterization demonstrated an occluded RCA with aneurysm formation at the prior angioplasty site. Aneurysm was treated with a Covered stent. Occlusion was treated with 2 drug-eluting stents (one proximal and one distal to the covered stent). There was a small pericardial effusion noted post MI. She was DC on short course of steroids and FU echo demonstrated just trivial effusion. Readmitted 8/19-8/20/15 with dyspnea that improved with one dose of IV Lasix. Echo demonstrated normal LVF and no significant pericardial effusion. Readmitted 2/97-9/89/21 with a/c diastolic CHF. Diuresis was complicated by AKI and ACEI was held. She became dyspneic on Brilinta and she was changed to Effient.    She is here today for follow up. She denies chest discomfort with exertion.  She ambulates with a walker. She has no energy. No SOB. Not walking.   Primary Care Physician:  Last Lipid Profile:Lipid Panel     Component Value Date/Time   CHOL 147 02/19/2014 0228   TRIG 521* 02/19/2014 0228   HDL 43 02/19/2014 0228   CHOLHDL 3.4 02/19/2014 0228   VLDL UNABLE TO CALCULATE IF TRIGLYCERIDE OVER 400 mg/dL 02/19/2014 0228   LDLCALC UNABLE TO CALCULATE IF TRIGLYCERIDE OVER 400 mg/dL 02/19/2014 1941     Past Medical History  Diagnosis Date  . COPD (chronic obstructive pulmonary disease)   . CAD (coronary artery disease) 1990; 2015    Cardiac cath 1990 with Dr. Lia Foyer and [name] Residual CAD, non obstructive per 2015 cath. STEMI status post stent in August 2015.    Marland Kitchen Hyperlipidemia with target LDL less than 70   . Essential hypertension   . Diabetes mellitus type 2, insulin dependent   . AV block, 1st degree   . 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 Myocardial infarction   . Anginal pain   . Shortness of breath   . Arthritis     Past Surgical History  Procedure Laterality Date  . Cholecystectomy    . Cataract extraction  2010  . Ptca  1990    PTCA of RCA  . Transthoracic echocardiogram  02/02/2012    mild LVH, EF 55-60%, Normal WM, Gr 1 DD; Mild MR  . Coronary angioplasty with stent placement  02/18/14    Promus DES to RCA  . Left heart catheterization with coronary angiogram N/A 02/18/2014    Procedure: LEFT HEART CATHETERIZATION WITH CORONARY ANGIOGRAM;  Surgeon: Jettie Booze, MD;  Location[facility];  Service: Cardiovascular;  Laterality: N/A;    Current Outpatient Prescriptions  Medication Sig Dispense Refill  . ACCU-CHEK COMPACT PLUS test strip     . acetaminophen (TYLENOL) 325 MG tablet Take 650 mg by mouth every 6 (six) hours as needed for mild pain.    Marland Kitchen albuterol (PROAIR HFA) 108 (90 BASE) MCG/ACT inhaler Inhale 1 puff into the lungs every 4 (four) hours as needed for wheezing or shortness of breath. 18 g 5  . albuterol (PROVENTIL) (2.5 MG/3ML) 0.083%  nebulizer solution INHALE 1 VIAL USING NEBULIZER FOUR TIMES A DAY AS NEEDED 150 mL 1  . aspirin 81 MG tablet     . B-D ULTRAFINE III SHORT PEN 31G X 8 MM MISC USE WITH LANTUS SOLOSTAR 100 each 0  . enalapril (VASOTEC) 20 MG tablet Take 1 tablet (20 mg total) by mouth 2 (two) times daily. 60 tablet 1  . fenofibrate micronized (LOFIBRA) 134 MG capsule Take 134 mg by mouth daily before breakfast.    . fish oil-omega-3 fatty acids 1000 MG capsule Take 2 g by mouth daily.      . fluticasone (FLONASE) 50 MCG/ACT nasal spray Place 1 spray into both nostrils daily.    . Fluticasone-Salmeterol (ADVAIR  DISKUS) 250-50 MCG/DOSE AEPB Inhale 1 puff into the lungs daily as needed. 60 each 1  . folic acid (FOLVITE) 973 MCG tablet Take 400 mcg by mouth daily.      . furosemide (LASIX) 20 MG tablet Take 20 mg by mouth daily.    . Insulin Glargine (LANTUS SOLOSTAR) 100 UNIT/ML Solostar Pen INJECT 10-15 UNITS INTO THE SKIN Daily 15 mL 11  . LORazepam (ATIVAN) 0.5 MG tablet Take 0.5-1 tablets (0.25-0.5 mg total) by mouth daily as needed for anxiety. 30 tablet 0  . metFORMIN (GLUCOPHAGE) 1000 MG tablet TAKE 1 TABLET BY MOUTH TWO TIMES A DAY 60 tablet 5  . metoprolol succinate (TOPROL-XL) 100 MG 24 hr tablet Take 100 mg by mouth daily. Take with or immediately following a meal.    . nitroGLYCERIN (NITROSTAT) 0.4 MG SL tablet Place 0.4 mg under the tongue every 5 (five) minutes as needed for chest pain.    . pantoprazole (PROTONIX) 40 MG tablet Take 40 mg by mouth daily.    . potassium chloride SA (K-DUR,KLOR-CON) 20 MEQ tablet Take 0.5 tablets (10 mEq total) by mouth daily.    . prasugrel (EFFIENT) 10 MG TABS tablet Take 1 tablet (10 mg total) by mouth daily. 30 tablet 5  . rosuvastatin (CRESTOR) 40 MG tablet Take 40 mg by mouth daily.    . sertraline (ZOLOFT) 25 MG tablet Take 1 tablet (25 mg total) by mouth daily. 30 tablet 2  . sodium chloride (OCEAN) 0.65 % SOLN nasal spray Place 1 spray into both nostrils as needed for congestion.    Marland Kitchen tiotropium (SPIRIVA) 18 MCG inhalation capsule Place 18 mcg into inhaler and inhale daily.    . traMADol (ULTRAM) 50 MG tablet TAKE 1 TABLET BY MOUTH EVERY 6 HOURS AS NEEDED FOR MODERATE PAIN 30 tablet 0  . vitamin E 400 UNIT capsule Take 400 Units by mouth daily.       No current facility-administered medications for this visit.    Allergies  Allergen Reactions  . Codeine Sulfate Nausea Only  . Morphine Sulfate Nausea Only    History   Social History  . Marital Status: Married    Spouse Name: N/A    Number of Children: 2  . Years of Education: N/A    Occupational History  . Retired    Social History Main Topics  . Smoking status: Former Smoker -- 1.00 packs/day for 55 years    Types: Cigarettes    Quit date: 09/29/2006  . Smokeless tobacco: Never Used  . Alcohol Use: No  . Drug Use: No  . Sexual Activity: Not on file   Other Topics Concern  . Not on file   Social History Narrative   Lives with her son but he works  all day. Home for dinner.  Independent ADL. Needs assist on some IADL.     Family History  Problem Relation Age of Onset  . Heart attack Mother 37  . Cancer Father 47  . Heart attack Son     Review of Systems:  As stated in the HPI and otherwise negative.   BP 122/68 mmHg  Pulse 82  Ht 5\' 5"  (1.651 m)  Wt 162 lb (73.483 kg)  BMI 26.96 kg/m2  SpO2 97%  Physical Examination: General: Well developed, well nourished, NAD HEENT: OP clear, mucus membranes moist SKIN: warm, dry. No rashes. Neuro: No focal deficits Musculoskeletal: Muscle strength 5/5 all ext Psychiatric: Mood and affect normal Neck: No JVD, no carotid bruits, no thyromegaly, no lymphadenopathy. Lungs:Clear bilaterally, no wheezes, rhonci, crackles Cardiovascular: Regular rate and rhythm. Soft systolic murmurs, gallops or rubs. Abdomen:Soft. Bowel sounds present. Non-tender.  Extremities: No lower extremity edema. Pulses are 2 + in the bilateral DP/PT.  Assessment and Plan:   1. CAD: Stable.Continue Effient, statin and beta blocker.   3. HTN: BP stable. No changes  4. Hyperlipidemia: On statin. Lipids well controlled.   5. Chronic diastolic CHF: Weight is stable. Continue Lasix

## 2014-07-03 NOTE — Patient Instructions (Signed)
Your physician recommends that you schedule a follow-up appointment in:  4 months with Dr. Angelena Form

## 2014-07-07 ENCOUNTER — Other Ambulatory Visit: Payer: Self-pay | Admitting: Family Medicine

## 2014-07-07 MED ORDER — TRAMADOL HCL 50 MG PO TABS: 50.0000 mg | ORAL_TABLET | Freq: Four times a day (QID) | ORAL | Status: DC | PRN

## 2014-07-07 NOTE — Telephone Encounter (Signed)
Is this ok to refill?  

## 2014-07-07 NOTE — Telephone Encounter (Signed)
Yes 5 refills #50

## 2014-07-07 NOTE — Telephone Encounter (Signed)
Medication refilled and called in

## 2014-07-13 ENCOUNTER — Emergency Department (HOSPITAL_COMMUNITY): Payer: Commercial Managed Care - HMO

## 2014-07-13 ENCOUNTER — Encounter (HOSPITAL_COMMUNITY): Payer: Self-pay | Admitting: Emergency Medicine

## 2014-07-13 ENCOUNTER — Emergency Department (HOSPITAL_COMMUNITY)
Admission: EM | Admit: 2014-07-13 | Discharge: 2014-07-13 | Disposition: A | Discharge status: Home / Self Care | Payer: Commercial Managed Care - HMO | Attending: Emergency Medicine | Admitting: Emergency Medicine

## 2014-07-13 DIAGNOSIS — I10 Essential (primary) hypertension: Secondary | ICD-10-CM | POA: Diagnosis not present

## 2014-07-13 DIAGNOSIS — I25119 Atherosclerotic heart disease of native coronary artery with unspecified angina pectoris: Secondary | ICD-10-CM | POA: Diagnosis not present

## 2014-07-13 DIAGNOSIS — J449 Chronic obstructive pulmonary disease, unspecified: Secondary | ICD-10-CM | POA: Diagnosis not present

## 2014-07-13 DIAGNOSIS — W06XXXA Fall from bed, initial encounter: Secondary | ICD-10-CM | POA: Diagnosis not present

## 2014-07-13 DIAGNOSIS — Z79899 Other long term (current) drug therapy: Secondary | ICD-10-CM | POA: Insufficient documentation

## 2014-07-13 DIAGNOSIS — M1990 Unspecified osteoarthritis, unspecified site: Secondary | ICD-10-CM | POA: Diagnosis not present

## 2014-07-13 DIAGNOSIS — S59902A Unspecified injury of left elbow, initial encounter: Secondary | ICD-10-CM | POA: Diagnosis present

## 2014-07-13 DIAGNOSIS — M25022 Hemarthrosis, left elbow: Secondary | ICD-10-CM | POA: Insufficient documentation

## 2014-07-13 DIAGNOSIS — Y998 Other external cause status: Secondary | ICD-10-CM | POA: Diagnosis not present

## 2014-07-13 DIAGNOSIS — Y92003 Bedroom of unspecified non-institutional (private) residence as the place of occurrence of the external cause: Secondary | ICD-10-CM | POA: Diagnosis not present

## 2014-07-13 DIAGNOSIS — E119 Type 2 diabetes mellitus without complications: Secondary | ICD-10-CM | POA: Diagnosis not present

## 2014-07-13 DIAGNOSIS — Y9389 Activity, other specified: Secondary | ICD-10-CM | POA: Diagnosis not present

## 2014-07-13 DIAGNOSIS — S80211A Abrasion, right knee, initial encounter: Secondary | ICD-10-CM | POA: Diagnosis not present

## 2014-07-13 DIAGNOSIS — E785 Hyperlipidemia, unspecified: Secondary | ICD-10-CM | POA: Diagnosis not present

## 2014-07-13 DIAGNOSIS — Z7982 Long term (current) use of aspirin: Secondary | ICD-10-CM | POA: Diagnosis not present

## 2014-07-13 DIAGNOSIS — W19XXXA Unspecified fall, initial encounter: Secondary | ICD-10-CM

## 2014-07-13 DIAGNOSIS — Z87891 Personal history of nicotine dependence: Secondary | ICD-10-CM | POA: Diagnosis not present

## 2014-07-13 DIAGNOSIS — M25039 Hemarthrosis, unspecified wrist: Secondary | ICD-10-CM

## 2014-07-13 DIAGNOSIS — I252 Old myocardial infarction: Secondary | ICD-10-CM | POA: Diagnosis not present

## 2014-07-13 DIAGNOSIS — Z794 Long term (current) use of insulin: Secondary | ICD-10-CM | POA: Diagnosis not present

## 2014-07-13 DIAGNOSIS — Z9861 Coronary angioplasty status: Secondary | ICD-10-CM | POA: Diagnosis not present

## 2014-07-13 DIAGNOSIS — S5002XA Contusion of left elbow, initial encounter: Secondary | ICD-10-CM | POA: Diagnosis not present

## 2014-07-13 IMAGING — CR DG ELBOW COMPLETE 3+V*L*
4 series · 4 of 4 positions shown · non-contrast
Comparison: None.

CLINICAL DATA: Fell from bed at home this morning, elbow and knee
pain.

EXAM:
LEFT ELBOW - COMPLETE 3+ VIEW

[x elbow ap left]
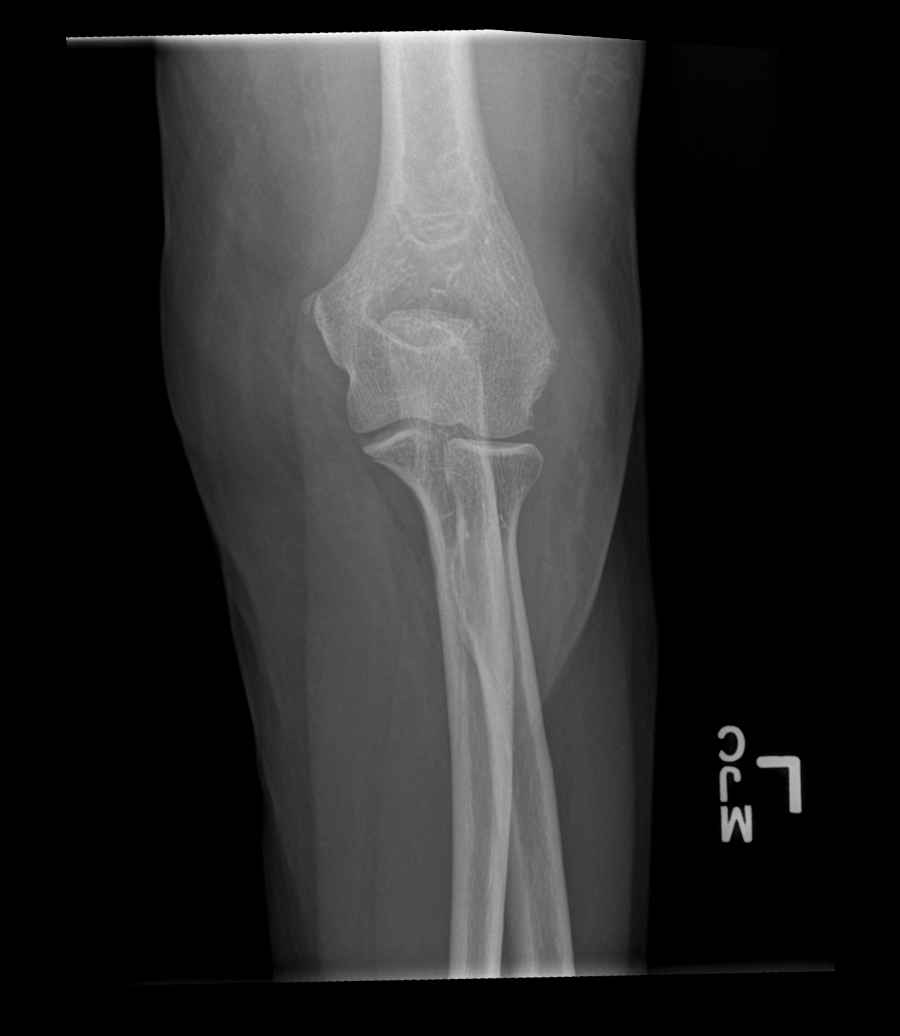

[x elbow obl left (1 of 2)]
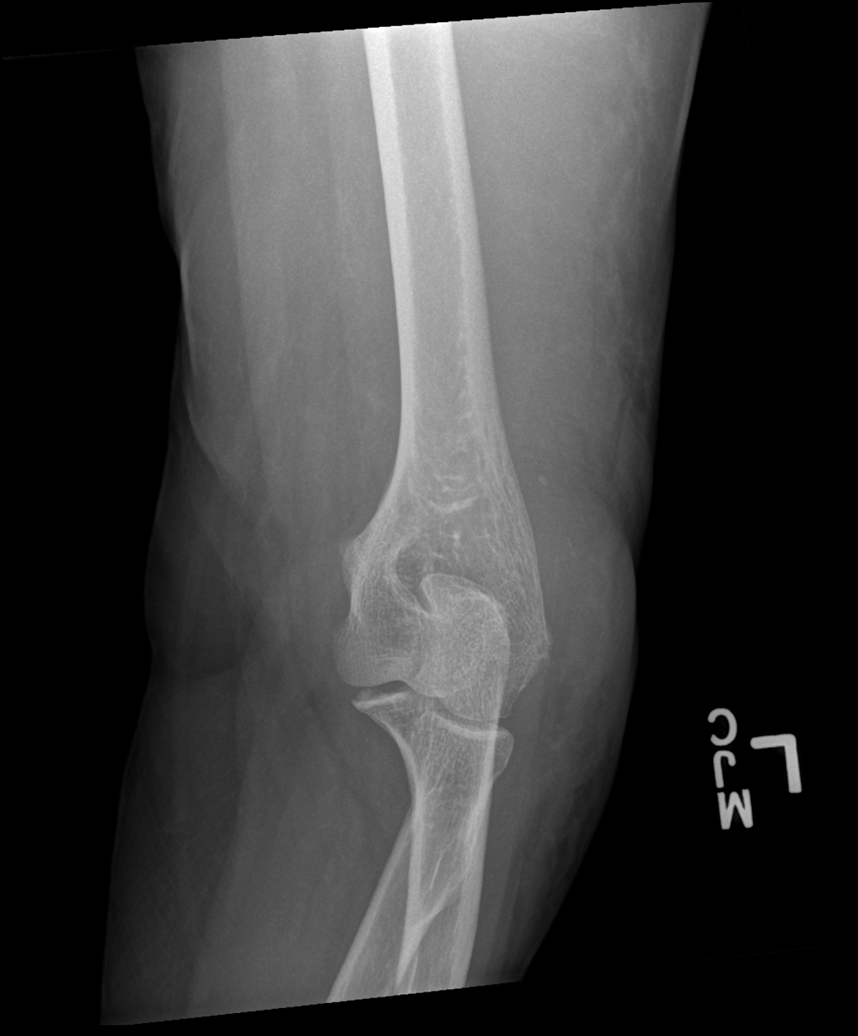

[x elbow obl left (2 of 2)]
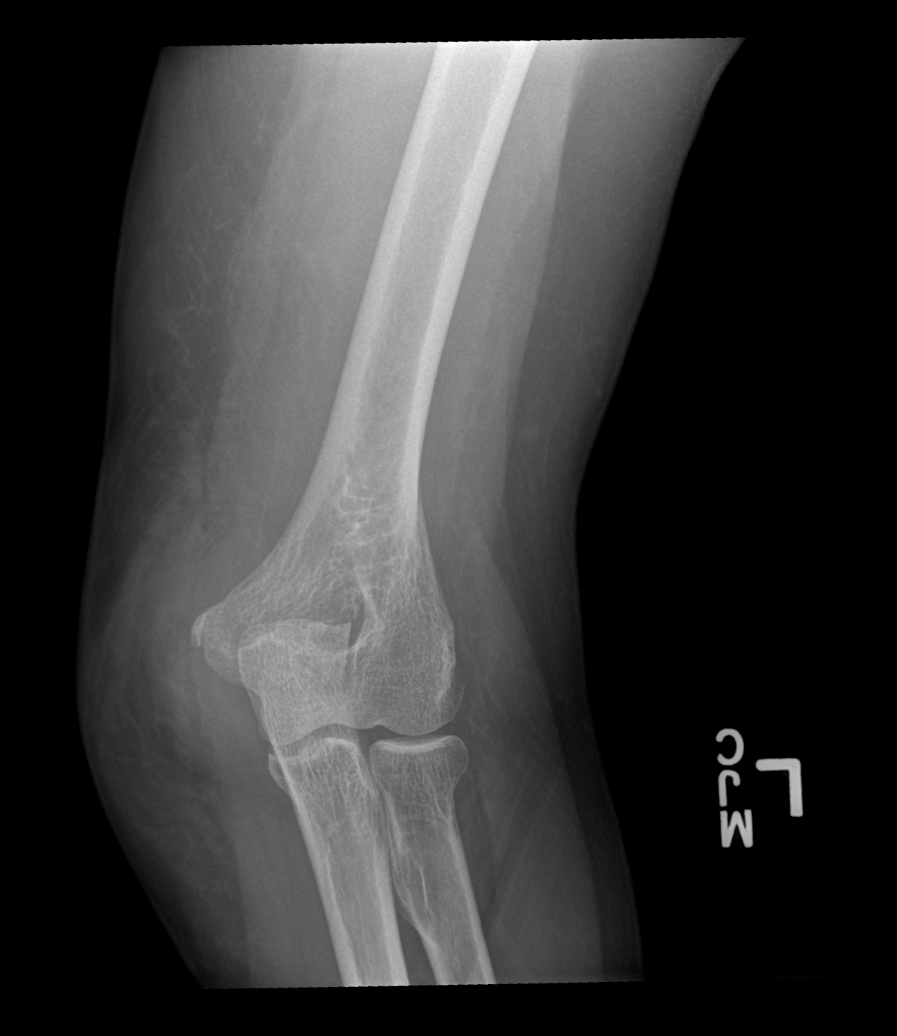

[x elbow lat left]
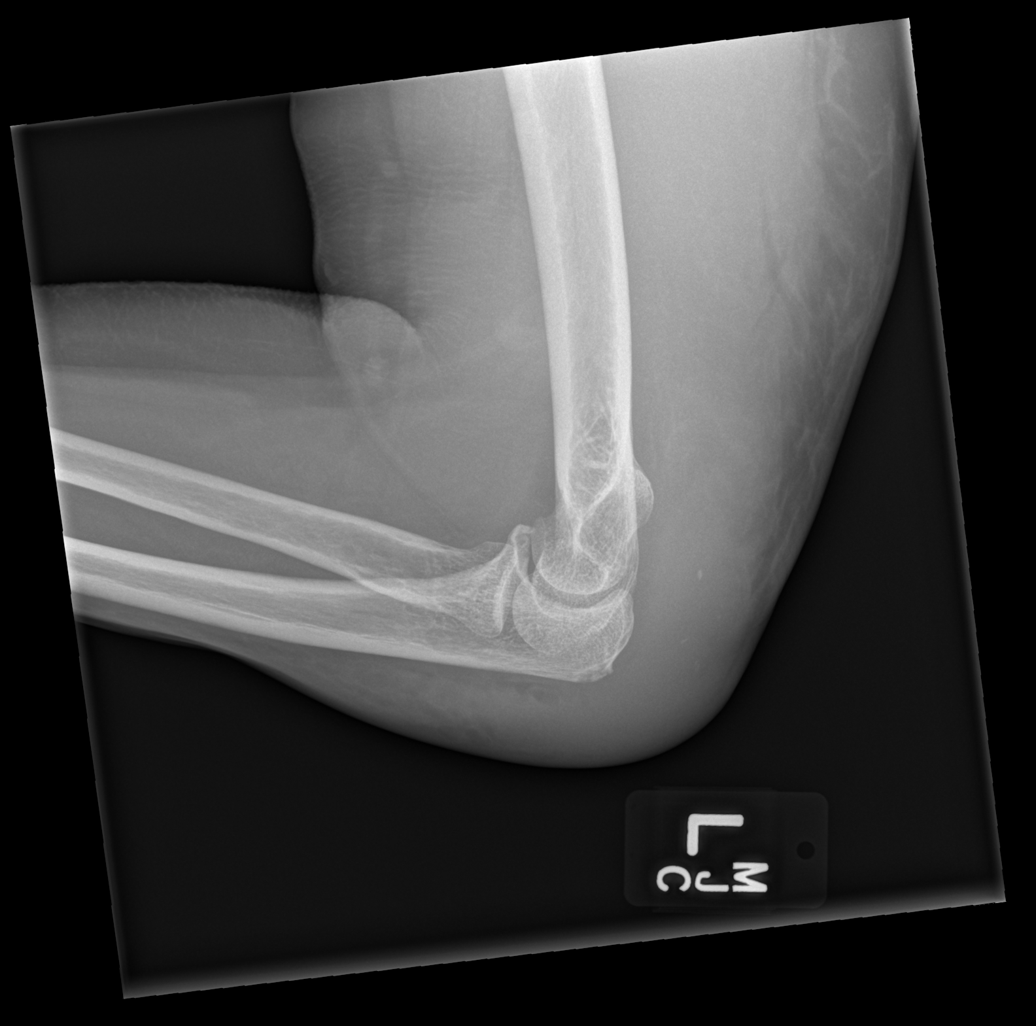

[4 of 4 positions shown; findings below may reference images not displayed]

FINDINGS: Cortical irregularity of the lateral humeral epicondyle seen on a
single view. No dislocation. Medial humeral epicondylar enthesopathy
versus remote extensor tendon injury. No destructive bony lesions.
Small anterior joint effusion. Severe soft tissue swelling at the
olecranon, punctate calcification without radiopaque foreign bodies
or subcutaneous gas.
IMPRESSION: Cortical irregularity of lateral humeral epicondyle is likely
projectional though, recommend correlation with point tenderness. No
dislocation.

Severe olecranon soft tissue swelling favors bursitis/hematoma.
Small elbow effusion.

  By: MOSCOSO

## 2014-07-13 IMAGING — CR DG KNEE COMPLETE 4+V*R*
4 series · 4 of 4 positions shown · non-contrast
Comparison: RIGHT knee radiograph [DATE]

CLINICAL DATA: Fell from bed at home this morning, elbow and knee
pain.

EXAM:
RIGHT KNEE - COMPLETE 4+ VIEW

[x knee ap right (1 of 4)]
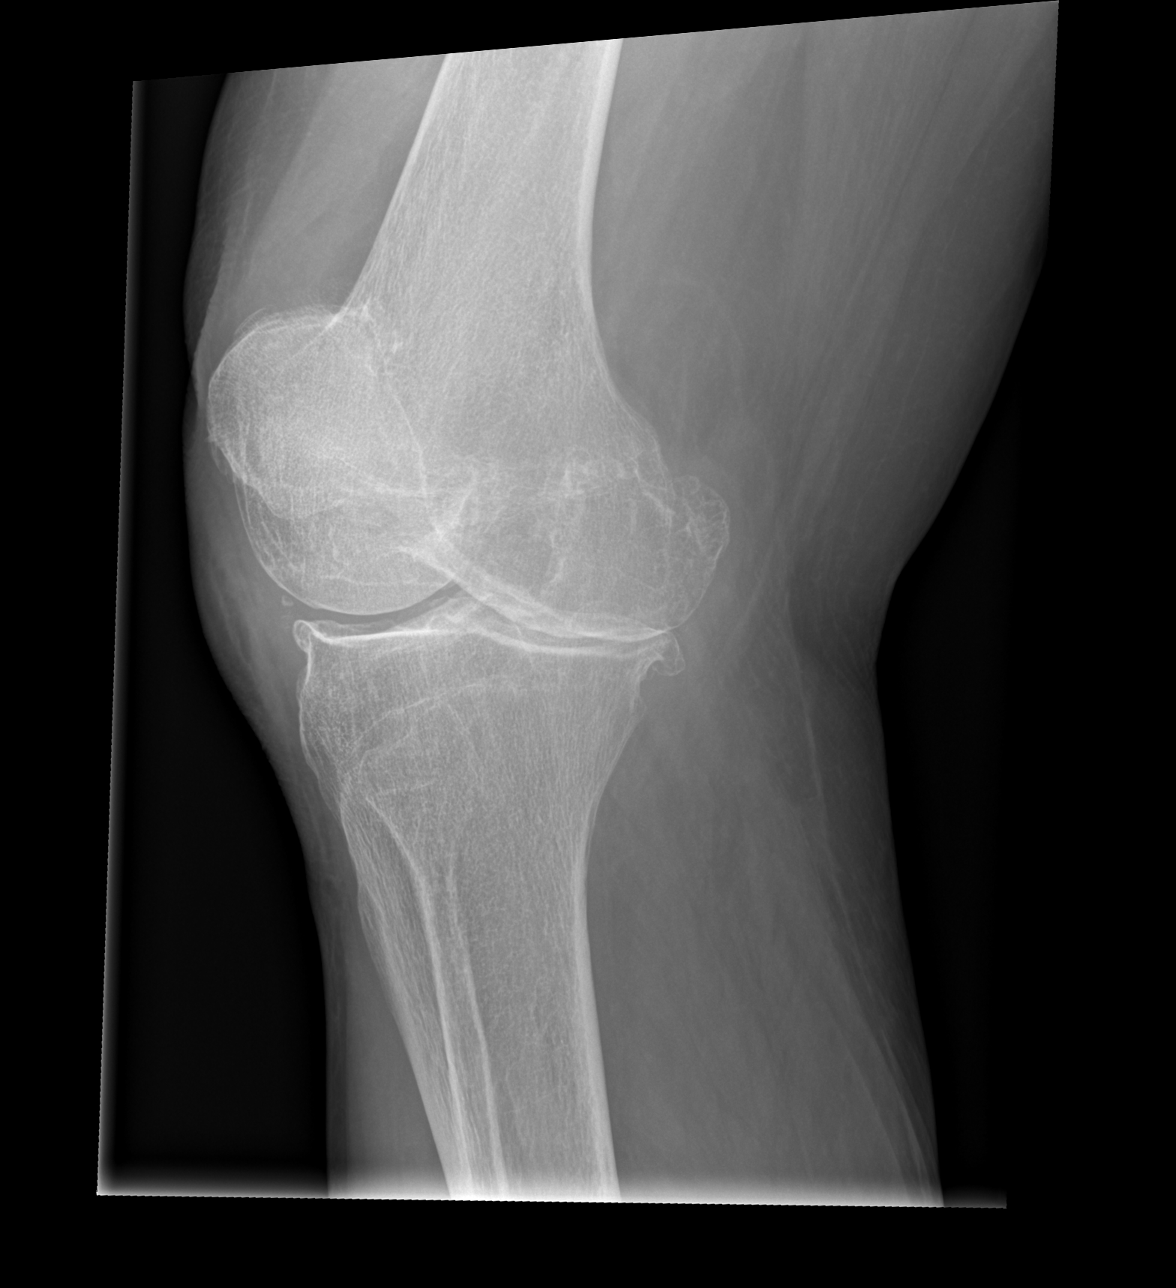

[x knee ap right (2 of 4)]
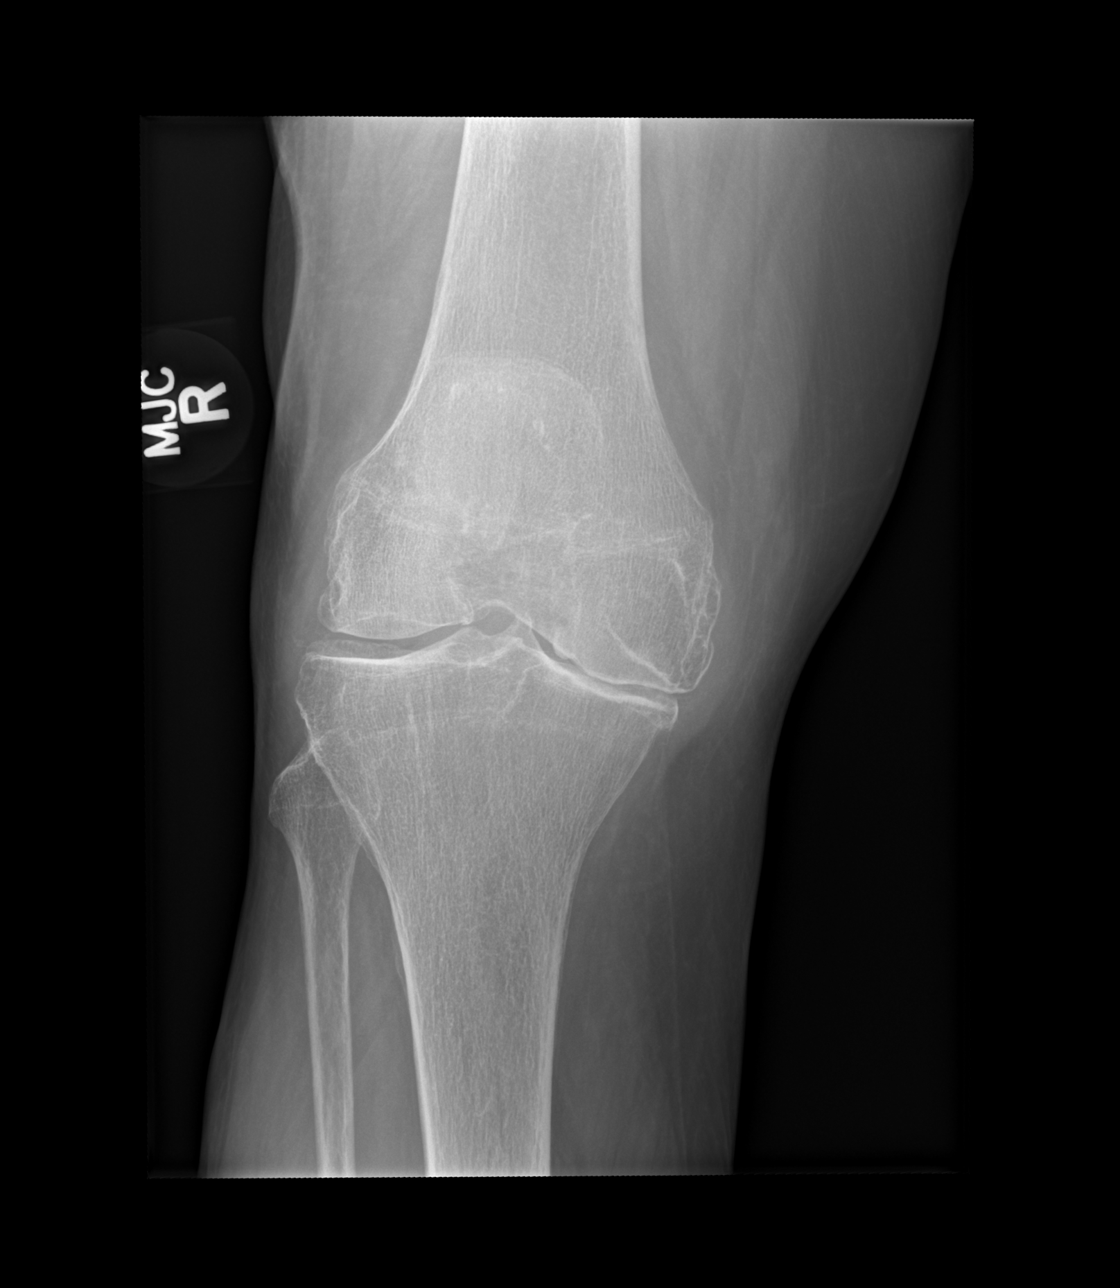

[x knee ap right (3 of 4)]
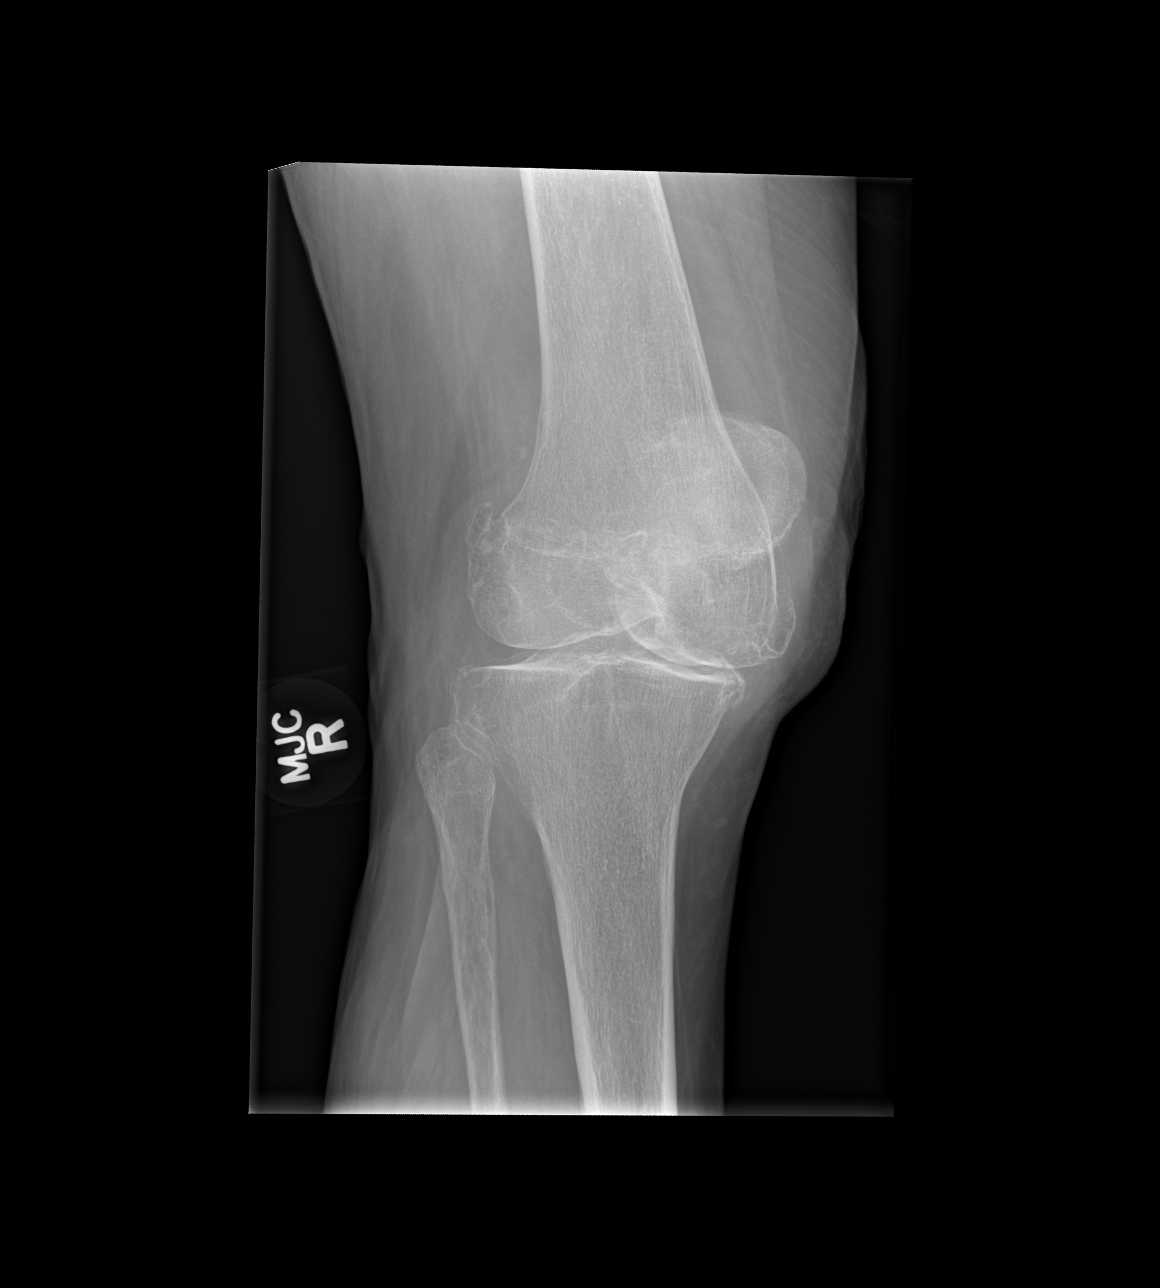

[x knee ap right (4 of 4)]
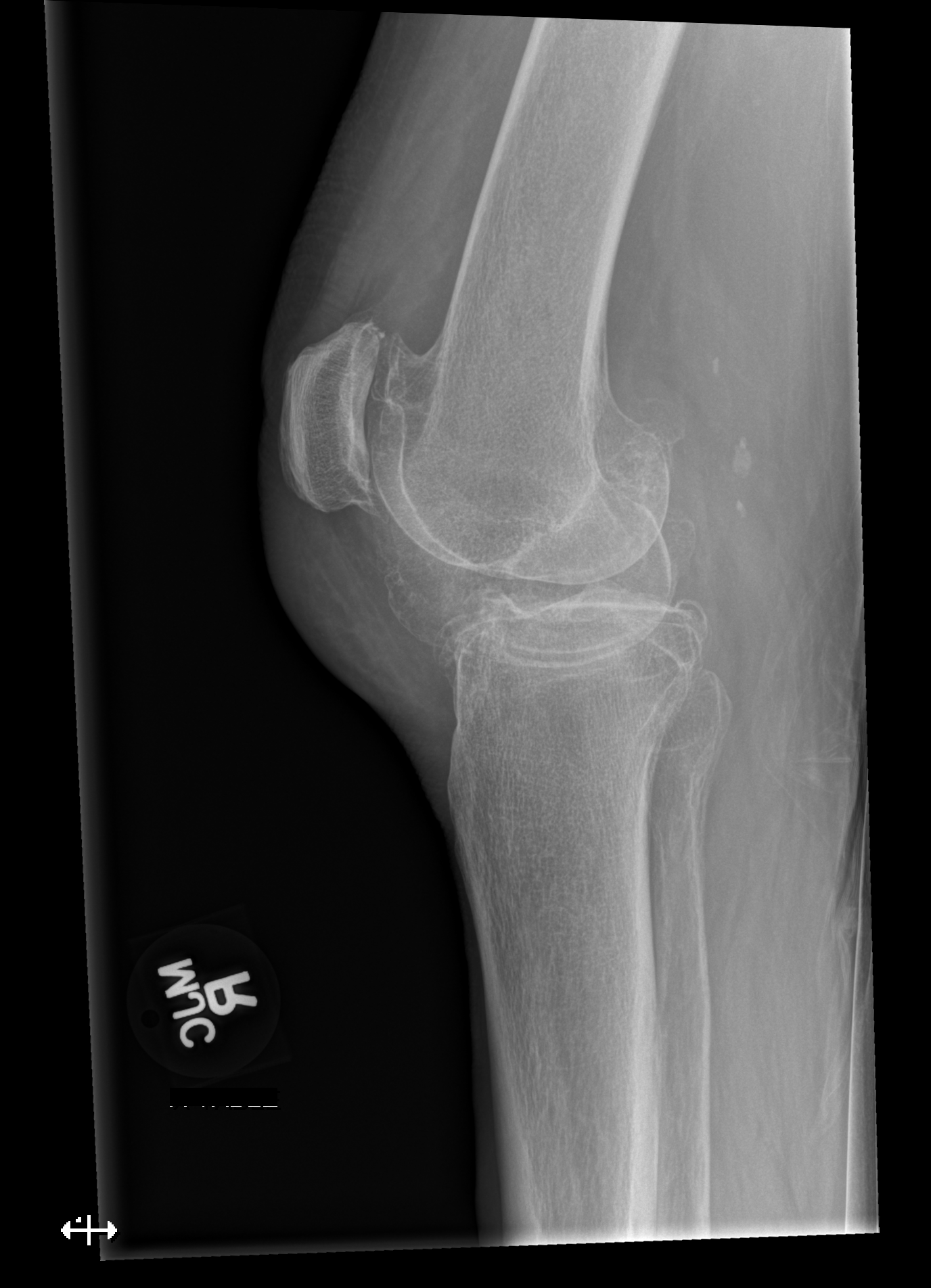

[4 of 4 positions shown; findings below may reference images not displayed]

FINDINGS: No acute fracture deformity or dislocation. Severe medial, moderate
to severe patellofemoral and lateral compartment osteoarthrosis.
Bone mineral density is decreased without destructive bony lesions.
Infrapatellar soft tissue swelling. Mild vascular calcifications.
IMPRESSION: No acute fracture deformity or dislocation. Osteopenia decreases
sensitivity for acute nondisplaced fracture. Tricompartmental
osteoarthrosis, severe within the medial compartment.

Infrapatellar soft tissue swelling.

  By: LOPTE

## 2014-07-13 MED ORDER — ACETAMINOPHEN 500 MG PO TABS
1000.0000 mg | ORAL_TABLET | Freq: Once | ORAL | Status: AC
  Administered 2014-07-13: 1000 mg via ORAL
  Filled 2014-07-13: qty 2

## 2014-07-13 NOTE — ED Provider Notes (Signed)
CSN: [id number]     Arrival date & time 07/13/14  0248 History   First MD Initiated Contact with Patient 07/13/14 [phone]     Chief Complaint  Patient presents with  . Fall  . left elbow injury   . right knee pain      (Consider location/radiation/quality/duration/timing/severity/associated sxs/prior Treatment) HPI  Michelle [name] is a 80 y.o. female with past medical history of COPD, coronary artery disease, hyperlipidemia, hypertension, diabetes presenting today after a fall. Patient states she rolled out of her bed at 2:00 this morning and landed on a hardwood floor. She denies hitting her head or LOC. She denies any prodromal symptoms, patient does not remember rolling over she only woke up with a fall. He states she sleeps on the edge of the bed. She has pain to her left elbow with obvious swelling. His pain at her right knee as well. She states she takes Prasugrel for atrial fibrillation. Also a baby aspirin every day.  Patient has numbness and tingling in her left hand, but this is chronic for her. She denies any worsening. Patient is denying pain anywhere else. She has no further complaints.  10 Systems reviewed and are negative for acute change except as noted in the HPI.    Past Medical History  Diagnosis Date  . COPD (chronic obstructive pulmonary disease)   . CAD (coronary artery disease) 1990; 2015    Cardiac cath 1990 with Dr. Lia Foyer and [name] Residual CAD, non obstructive per 2015 cath. STEMI status post stent in August 2015.  Marland Kitchen Hyperlipidemia with target LDL less than 70   . Essential hypertension   . Diabetes mellitus type 2, insulin dependent   . AV block, 1st degree   . 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 Myocardial infarction   .  Anginal pain   . Shortness of breath   . Arthritis    Past Surgical History  Procedure Laterality Date  . Cholecystectomy    . Cataract extraction  2010  . Ptca  1990    PTCA of RCA  . Transthoracic echocardiogram  02/02/2012    mild LVH, EF 55-60%, Normal WM, Gr 1 DD; Mild MR  . Coronary angioplasty with stent placement  02/18/14    Promus DES to RCA  . Left heart catheterization with coronary angiogram N/A 02/18/2014    Procedure: LEFT HEART CATHETERIZATION WITH CORONARY ANGIOGRAM;  Surgeon: Jettie Booze, MD;  Location: [facility] CATH LAB;  Service: Cardiovascular;  Laterality: N/A;   Family History  Problem Relation Age of Onset  . Heart attack Mother 69  . Cancer Father 1  . Heart attack Son    History  Substance Use Topics  . Smoking status: Former Smoker -- 1.00 packs/day for 55 years    Types: Cigarettes    Quit date: 09/29/2006  . Smokeless tobacco: Never Used  . Alcohol Use: No   OB History    No data available     Review of Systems    Allergies  Codeine sulfate and Morphine sulfate  Home Medications   Prior to Admission medications   Medication Sig Start Date End Date Taking? Authorizing Provider  acetaminophen (TYLENOL) 325 MG tablet Take 650 mg by mouth every  6 (six) hours as needed for mild pain.   Yes Historical Provider, MD  albuterol (PROAIR HFA) 108 (90 BASE) MCG/ACT inhaler Inhale 1 puff into the lungs every 4 (four) hours as needed for wheezing or shortness of breath. 06/18/14  Yes [name], MD  albuterol (PROVENTIL) (2.5 MG/3ML) 0.083% nebulizer solution INHALE 1 VIAL USING NEBULIZER FOUR TIMES A DAY AS NEEDED Patient taking differently: Take 2.5 mg by nebulization every 4 (four) hours as needed for wheezing.  06/18/14  Yes [name], MD  aspirin 81 MG tablet  07/02/14  Yes Historical Provider, MD  enalapril (VASOTEC) 20 MG tablet Take 1 tablet (20 mg total) by mouth 2 (two) times daily. 06/20/14  Yes [name], MD  fenofibrate  micronized (LOFIBRA) 134 MG capsule Take 134 mg by mouth daily before breakfast.   Yes Historical Provider, MD  fish oil-omega-3 fatty acids 1000 MG capsule Take 2 g by mouth daily.     Yes Historical Provider, MD  fluticasone (FLONASE) 50 MCG/ACT nasal spray Place 1 spray into both nostrils daily.   Yes Historical Provider, MD  Fluticasone-Salmeterol (ADVAIR DISKUS) 250-50 MCG/DOSE AEPB Inhale 1 puff into the lungs daily as needed. 06/11/14  Yes [name], MD  folic acid (FOLVITE) 536 MCG tablet Take 400 mcg by mouth daily.     Yes Historical Provider, MD  furosemide (LASIX) 20 MG tablet Take 20 mg by mouth daily.   Yes Historical Provider, MD  Insulin Glargine (LANTUS SOLOSTAR) 100 UNIT/ML Solostar Pen INJECT 10-15 UNITS INTO THE SKIN Daily 06/20/14  Yes [name], MD  LORazepam (ATIVAN) 0.5 MG tablet Take 0.5-1 tablets (0.25-0.5 mg total) by mouth daily as needed for anxiety. 06/20/14  Yes [name], MD  metFORMIN (GLUCOPHAGE) 1000 MG tablet TAKE 1 TABLET BY MOUTH TWO TIMES A DAY 03/25/14  Yes Lisabeth Pick, MD  metoprolol succinate (TOPROL-XL) 100 MG 24 hr tablet Take 100 mg by mouth daily. Take with or immediately following a meal.   Yes Historical Provider, MD  pantoprazole (PROTONIX) 40 MG tablet Take 40 mg by mouth daily.   Yes Historical Provider, MD  potassium chloride SA (K-DUR,KLOR-CON) 20 MEQ tablet Take 0.5 tablets (10 mEq total) by mouth daily. 03/21/14  Yes Liliane Shi, PA-C  prasugrel (EFFIENT) 10 MG TABS tablet Take 1 tablet (10 mg total) by mouth daily. 03/19/14  Yes Scott Joylene Draft, PA-C  rosuvastatin (CRESTOR) 40 MG tablet Take 40 mg by mouth daily.   Yes Historical Provider, MD  sertraline (ZOLOFT) 25 MG tablet Take 1 tablet (25 mg total) by mouth daily. 06/20/14  Yes [name], MD  sodium chloride (OCEAN) 0.65 % SOLN nasal spray Place 1 spray into both nostrils as needed for congestion.   Yes Historical Provider, MD  tiotropium (SPIRIVA) 18 MCG inhalation  capsule Place 18 mcg into inhaler and inhale daily.   Yes Historical Provider, MD  traMADol (ULTRAM) 50 MG tablet Take 1 tablet (50 mg total) by mouth every 6 (six) hours as needed for moderate pain. 07/07/14  Yes [name], MD  vitamin E 400 UNIT capsule Take 400 Units by mouth daily.     Yes Historical Provider, MD  ACCU-CHEK COMPACT PLUS test strip  06/03/14   Historical Provider, MD  B-D ULTRAFINE III SHORT PEN 31G X 8 MM MISC USE WITH LANTUS SOLOSTAR 06/03/14   Lisabeth Pick, MD  nitroGLYCERIN (NITROSTAT) 0.4 MG SL tablet Place 0.4 mg  under the tongue every 5 (five) minutes as needed for chest pain.    Historical Provider, MD   BP 164/60 mmHg  Pulse 81  Temp(Src) 98.4 F (36.9 C) (Oral)  Resp 18  SpO2 94% Physical Exam  Constitutional: She is oriented to person, place, and time. She appears well-developed and well-nourished. No distress.  HENT:  Head: Normocephalic and atraumatic.  Nose: Nose normal.  Mouth/Throat: Oropharynx is clear and moist. No oropharyngeal exudate.  Eyes: Conjunctivae and EOM are normal. Pupils are equal, round, and reactive to light. No scleral icterus.  Neck: Normal range of motion. Neck supple. No JVD present. No tracheal deviation present. No thyromegaly present.  Cardiovascular: Normal rate, regular rhythm and normal heart sounds.  Exam reveals no gallop and no friction rub.   No murmur heard. Pulmonary/Chest: Effort normal and breath sounds normal. No respiratory distress. She has no wheezes. She exhibits no tenderness.  Abdominal: Soft. Bowel sounds are normal. She exhibits no distension and no mass. There is no tenderness. There is no rebound and no guarding.  Musculoskeletal: Normal range of motion. She exhibits edema and tenderness.  Left elbow with obvious bruising, and hemarthrosis. There is no severe tenderness to palpation, there is normal range of motion. Normal pulses distally.  Right knee with small abrasion, no active hemorrhage. There  is no swelling or tenderness. Patient has full range of motion.  Lymphadenopathy:    She has no cervical adenopathy.  Neurological: She is alert and oriented to person, place, and time. No cranial nerve deficit. She exhibits normal muscle tone.  Skin: Skin is warm and dry. No rash noted. She is not diaphoretic. No erythema. No pallor.  Nursing note and vitals reviewed.   ED Course  Procedures (including critical care time) Labs Review Labs Reviewed - No data to display  Imaging Review Dg Elbow Complete Left  07/13/2014   CLINICAL DATA:  [place] from bed at home this morning, elbow and knee pain.  EXAM: LEFT ELBOW - COMPLETE 3+ VIEW  COMPARISON:  None.  FINDINGS: Cortical irregularity of the lateral humeral epicondyle seen on a single view. No dislocation. Medial humeral epicondylar enthesopathy versus remote extensor tendon injury. No destructive bony lesions. Small anterior joint effusion. Severe soft tissue swelling at the olecranon, punctate calcification without radiopaque foreign bodies or subcutaneous gas.  IMPRESSION: Cortical irregularity of lateral humeral epicondyle is likely projectional though, recommend correlation with point tenderness. No dislocation.  Severe olecranon soft tissue swelling favors bursitis/hematoma. Small elbow effusion.   Electronically Signed   By: Elon Alas   On: 07/13/2014 05:21   Dg Knee Complete 4 Views Right  07/13/2014   CLINICAL DATA:  [place] from bed at home this morning, elbow and knee pain.  EXAM: RIGHT KNEE - COMPLETE 4+ VIEW  COMPARISON:  RIGHT knee radiograph Dec 05, 2006  FINDINGS: No acute fracture deformity or dislocation. Severe medial, moderate to severe patellofemoral and lateral compartment osteoarthrosis. Bone mineral density is decreased without destructive bony lesions. Infrapatellar soft tissue swelling. Mild vascular calcifications.  IMPRESSION: No acute fracture deformity or dislocation. Osteopenia decreases sensitivity for acute  nondisplaced fracture. Tricompartmental osteoarthrosis, severe within the medial compartment.  Infrapatellar soft tissue swelling.   Electronically Signed   By: Elon Alas   On: 07/13/2014 05:23     EKG Interpretation None      MDM   Final diagnoses:  Fall    Patient since emergency department after falling out of bed. She is on a blood  thinner, it appears she has a large hemarthrosis I do not feel any bony deformities. Will get x-ray of knee and elbow for evaluation. Tylenol was given for pain.   Xrays negative for acute fracture, they reveal soft tissue swelling.  Patient is not tender at questionable area of fracture on the humerus. She is advised to continue Tylenol for pain, hemarthrosis education was given.  Primary care follow-up was advised within 3 days, her vital signs were within her normal limits and she is safe for discharge.   Everlene Balls, MD 07/13/14 [phone]

## 2014-07-13 NOTE — ED Notes (Addendum)
Per EMs , pt. From home reported of fall from bed at 2am this morning as she rolled over, pt. Claimed of pain on left elbow, hematoma noted, also claimed of pain on her right knee at 10/10, denied LOC. Alert and oriented x3. Pt is not sure if she's on blood thinner but claimed to be diabetic.

## 2014-07-13 NOTE — Discharge Instructions (Signed)
Fall Prevention and Home Safety Michelle [name], you were seen today after a fall.  Your xrays are negative for fracture.  You have some bleeding in the elbow and it will take several weeks for it all to absorb.  Follow up with your regular doctor within 3 days for continued management. Take Tylenol as needed for pain. If symptoms worsen come back to emergency department immediately. Thank you. Falls cause injuries and can affect all age groups. It is possible to prevent falls.  HOW TO PREVENT FALLS  Wear shoes with rubber soles that do not have an opening for your toes.  Keep the inside and outside of your house well lit.  Use night lights throughout your home.  Remove clutter from floors.  Clean up floor spills.  Remove throw rugs or fasten them to the floor with carpet tape.  Do not place electrical cords across pathways.  Put grab bars by your tub, shower, and toilet. Do not use towel bars as grab bars.  Put handrails on both sides of the stairway. Fix loose handrails.  Do not climb on stools or stepladders, if possible.  Do not wax your floors.  Repair uneven or unsafe sidewalks, walkways, or stairs.  Keep items you use a lot within reach.  Be aware of pets.  Keep emergency numbers next to the telephone.  Put smoke detectors in your home and near bedrooms. Ask your doctor what other things you can do to prevent falls. Document Released: 04/30/2009 Document Revised: 01/03/2012 Document Reviewed: 10/04/2011 [facility] Patient Information 2015 [place], [place]. This information is not intended to replace advice given to you by your health care provider. Make sure you discuss any questions you have with your health care provider. Elbow Contusion  An elbow contusion is a deep bruise of the elbow. Contusions happen when an injury causes bleeding under the skin. Signs of bruising include pain, puffiness (swelling), and discolored skin. The contusion may turn blue, purple, or  yellow. HOME CARE  Put ice on the injured area.  Put ice in a plastic bag.  Place a towel between your skin and the bag.  Leave the ice on for 15-20 minutes, 03-04 times a day.  Only take medicines as told by your doctor.  Rest your elbow until the pain and puffiness are better.  Raise (elevate) your elbow to lessen puffiness.  Put on an elastic wrap as told by your doctor. You can take it off for sleeping, showers, and baths. If your fingers get cold, blue, or lose feeling (numb), take the wrap off. Put the wrap back on more loosely.  Use your elbow only as told by your doctor. If you are asked to do elbow exercises, do them as told.  Keep all doctor visits as told. GET HELP RIGHT AWAY IF:  You have more redness, puffiness, or pain in your elbow.  Your puffiness or pain is not helped by medicines.  You have puffiness of the hand and fingers.  You are not able to move your fingers or wrist.  You start to lose feeling in your hand or fingers.  Your fingers or hand become cold or blue. MAKE SURE YOU:   Understand these instructions.  Will watch your condition.  Will get help right away if you are not doing well or get worse. Document Released: 06/23/2011 Document Revised: 01/03/2012 Document Reviewed: 06/23/2011 [facility] Patient Information 2015 [place], [place]. This information is not intended to replace advice given to you by your health care  provider. Make sure you discuss any questions you have with your health care provider. ° °

## 2014-07-13 NOTE — ED Notes (Signed)
Bed: WA10 Expected date:  Expected time:  Means of arrival:  Comments: EMS 

## 2014-07-16 ENCOUNTER — Other Ambulatory Visit: Payer: Self-pay | Admitting: *Deleted

## 2014-07-16 MED ORDER — ALBUTEROL SULFATE (2.5 MG/3ML) 0.083% IN NEBU: INHALATION_SOLUTION | RESPIRATORY_TRACT | Status: DC

## 2014-07-16 NOTE — Telephone Encounter (Signed)
Rx done. 

## 2014-07-17 ENCOUNTER — Ambulatory Visit: Payer: Medicare HMO | Admitting: Family Medicine

## 2014-07-25 ENCOUNTER — Encounter: Payer: Self-pay | Admitting: Family Medicine

## 2014-07-25 ENCOUNTER — Ambulatory Visit (INDEPENDENT_AMBULATORY_CARE_PROVIDER_SITE_OTHER): Payer: Commercial Managed Care - HMO | Admitting: Family Medicine

## 2014-07-25 VITALS — BP 122/62 | Temp 98.1°F | Wt 166.0 lb

## 2014-07-25 DIAGNOSIS — F411 Generalized anxiety disorder: Secondary | ICD-10-CM

## 2014-07-25 DIAGNOSIS — N182 Chronic kidney disease, stage 2 (mild): Secondary | ICD-10-CM | POA: Insufficient documentation

## 2014-07-25 DIAGNOSIS — I10 Essential (primary) hypertension: Secondary | ICD-10-CM

## 2014-07-25 DIAGNOSIS — Z79899 Other long term (current) drug therapy: Secondary | ICD-10-CM | POA: Diagnosis not present

## 2014-07-25 DIAGNOSIS — M170 Bilateral primary osteoarthritis of knee: Secondary | ICD-10-CM | POA: Diagnosis not present

## 2014-07-25 DIAGNOSIS — E119 Type 2 diabetes mellitus without complications: Secondary | ICD-10-CM

## 2014-07-25 DIAGNOSIS — N183 Chronic kidney disease, stage 3 unspecified: Secondary | ICD-10-CM | POA: Insufficient documentation

## 2014-07-25 DIAGNOSIS — Z794 Long term (current) use of insulin: Secondary | ICD-10-CM

## 2014-07-25 LAB — BASIC METABOLIC PANEL
BUN: 22 mg/dL (ref 6–23)
CO2: 26 mEq/L (ref 19–32)
Calcium: 9.5 mg/dL (ref 8.4–10.5)
Chloride: 105 mEq/L (ref 96–112)
Creatinine, Ser: 0.8 mg/dL (ref 0.4–1.2)
GFR: 72.39 mL/min (ref >60.00)
Glucose, Bld: 109 mg/dL — ABNORMAL HIGH (ref 70–99)
POTASSIUM: 3.9 meq/L (ref 3.5–5.1)
SODIUM: 139 meq/L (ref 135–145)

## 2014-07-25 NOTE — Assessment & Plan Note (Signed)
Improved control with increase of enalparil to BID last visit. Continue, Lasix 20mg , metoprolol 100mg  XL. It was also controlled at cardiology office and in the ED

## 2014-07-25 NOTE — Assessment & Plan Note (Signed)
Fasting CBGs were elevated on prednisone but have trended back down to 130-180 range. Titrate up lantus to 12 units from 11 and continue metformin 1g BID. Follow up in 2 months with repeat a1c.

## 2014-07-25 NOTE — Assessment & Plan Note (Signed)
Diffuse arthritic pain (knee, shoulder) improved with 7 days of prednisone-consider intermittent use in the future.

## 2014-07-25 NOTE — Patient Instructions (Addendum)
Check your sodium on the new medication  Take 12 units of Lantus in the morning, continue metformin. Go back to 11 units if you get below 90.   Ice the elbow, we can drain fluid off of it if needed but may recur so icing is the beter option'  Glad the zoloft is helping so much and you don't need the ativan anymore.   2 month follow up. Or as needed

## 2014-07-25 NOTE — Assessment & Plan Note (Signed)
Resolved on zoloft 25mg  and no longer taking ativan. No hyponatremia on bmet today.

## 2014-07-25 NOTE — Progress Notes (Signed)
[name], MD Phone: [phone]  Subjective:   Michelle [name] is a 81 y.o. year old very pleasant female patient who presents to follow up on her diabetes with mild poor control with last a1c of 7.5. Fasting sugars have been 130-180. Her hypertension is controlled an much improved from last visit. She is compliant with medications below. Her diffuse joint pain resolved with 7 days of prednisone. Her anxiety has improved drastically with 25 mg of zoloft and she is no longer taking ativan.   Unfortunately, she had a fall out of her bed when rolling while she slept requiring on 12/27 and had extensive bruising on her arm. Her elbow has swelling concerning for olecranon bursitis that she is concerned about. She has iced it perhaps once.   ROS- no hypoglycemia, no blurry vision. No chest pain or shortness of breath. No edema. No fever/chills/nausea/vomiting  Past Medical History- Patient Active Problem List   Diagnosis Date Noted  . Anxiety state 04/21/2014    Priority: High  . Chronic diastolic CHF (congestive heart failure) 03/13/2014    Priority: High  . CAD- RCA PCI '90s, RCA DES 02/18/14 03/05/2014    Priority: High  . STEMI 02/18/14- RCA DES 02/21/2014    Priority: High  . IDDM type 2 03/26/2007    Priority: High  . Pericarditis-post MI (short course of steroids) 03/06/2014    Priority: Medium  . SOB (shortness of breath) 03/05/2014    Priority: Medium  . Hyperlipidemia with target LDL less than 70 03/26/2007    Priority: Medium  . Essential hypertension 04/21/2006    Priority: Medium  . COPD 04/21/2006    Priority: Medium  . Osteoarthritis, knee 05/13/2014    Priority: Low  . Hematoma, rt forearm 02/21/2014    Priority: Low  . Back pain, lumbosacral 10/10/2012    Priority: Low   Medications- reviewed and updated Current Outpatient Prescriptions  Medication Sig Dispense Refill  . aspirin 81 MG tablet     . B-D ULTRAFINE III SHORT PEN 31G X 8 MM MISC USE WITH LANTUS  SOLOSTAR 100 each 0  . enalapril (VASOTEC) 20 MG tablet Take 1 tablet (20 mg total) by mouth 2 (two) times daily. 60 tablet 1  . fenofibrate micronized (LOFIBRA) 134 MG capsule Take 134 mg by mouth daily before breakfast.    . fish oil-omega-3 fatty acids 1000 MG capsule Take 2 g by mouth daily.      . fluticasone (FLONASE) 50 MCG/ACT nasal spray Place 1 spray into both nostrils daily.    . Fluticasone-Salmeterol (ADVAIR DISKUS) 250-50 MCG/DOSE AEPB Inhale 1 puff into the lungs daily as needed. 60 each 1  . folic acid (FOLVITE) 628 MCG tablet Take 400 mcg by mouth daily.      . furosemide (LASIX) 20 MG tablet Take 20 mg by mouth daily.    . Insulin Glargine (LANTUS SOLOSTAR) 100 UNIT/ML Solostar Pen INJECT 10-15 UNITS INTO THE SKIN Daily 15 mL 11  . metFORMIN (GLUCOPHAGE) 1000 MG tablet TAKE 1 TABLET BY MOUTH TWO TIMES A DAY 60 tablet 5  . metoprolol succinate (TOPROL-XL) 100 MG 24 hr tablet Take 100 mg by mouth daily. Take with or immediately following a meal.    . pantoprazole (PROTONIX) 40 MG tablet Take 40 mg by mouth daily.    . potassium chloride SA (K-DUR,KLOR-CON) 20 MEQ tablet Take 0.5 tablets (10 mEq total) by mouth daily.    . prasugrel (EFFIENT) 10 MG TABS tablet Take 1 tablet (  10 mg total) by mouth daily. 30 tablet 5  . rosuvastatin (CRESTOR) 40 MG tablet Take 40 mg by mouth daily.    . sertraline (ZOLOFT) 25 MG tablet Take 1 tablet (25 mg total) by mouth daily. 30 tablet 2  . sodium chloride (OCEAN) 0.65 % SOLN nasal spray Place 1 spray into both nostrils as needed for congestion.    Marland Kitchen tiotropium (SPIRIVA) 18 MCG inhalation capsule Place 18 mcg into inhaler and inhale daily.    . vitamin E 400 UNIT capsule Take 400 Units by mouth daily.      Marland Kitchen ACCU-CHEK COMPACT PLUS test strip     . acetaminophen (TYLENOL) 325 MG tablet Take 650 mg by mouth every 6 (six) hours as needed for mild pain.    Marland Kitchen albuterol (PROAIR HFA) 108 (90 BASE) MCG/ACT inhaler Inhale 1 puff into the lungs every 4  (four) hours as needed for wheezing or shortness of breath. (Patient not taking: Reported on 07/25/2014) 18 g 5  . albuterol (PROVENTIL) (2.5 MG/3ML) 0.083% nebulizer solution INHALE 1 VIAL USING NEBULIZER FOUR TIMES A DAY AS NEEDED (Patient not taking: Reported on 07/25/2014) 150 mL 1  . nitroGLYCERIN (NITROSTAT) 0.4 MG SL tablet Place 0.4 mg under the tongue every 5 (five) minutes as needed for chest pain.    . traMADol (ULTRAM) 50 MG tablet Take 1 tablet (50 mg total) by mouth every 6 (six) hours as needed for moderate pain. (Patient not taking: Reported on 07/25/2014) 30 tablet 0   No current facility-administered medications for this visit.    Objective: BP 122/62 mmHg  Temp(Src) 98.1 F (36.7 C)  Wt 166 lb (75.297 kg) Gen: NAD, resting comfortably, in wheelchair, assisted by son CV: RRR no murmurs rubs or gallops Lungs: CTAB no crackles, wheeze, rhonchi MSK: some bruising left forearm (much improved from ED), mild warmth and swelling with palpable fluid in olecranon bursa.  Ext: no edema Skin: warm, dry, no rash   Assessment/Plan:  IDDM type 2 Fasting CBGs were elevated on prednisone but have trended back down to 130-180 range. Titrate up lantus to 12 units from 11 and continue metformin 1g BID. Follow up in 2 months with repeat a1c.   Essential hypertension Improved control with increase of enalparil to BID last visit. Continue, Lasix 20mg , metoprolol 100mg  XL. It was also controlled at cardiology office and in the ED  Osteoarthritis, knee Diffuse arthritic pain (knee, shoulder) improved with 7 days of prednisone-consider intermittent use in the future.   Anxiety state Resolved on zoloft 25mg  and no longer taking ativan. No hyponatremia on bmet today.    Olecranon bursitis Advised regular icing. Offered drainage but patient not interested.   Return precautions advised. 2 month or prn.   Creatinine improved- patient likely CKD stage II or III but monitor for now.  Results for  orders placed or performed in visit on 07/25/14 (from the past 24 hour(s))  Basic metabolic panel     Status: Abnormal   Collection Time: 07/25/14  2:07 PM  Result Value Ref Range   Sodium 139 135 - 145 mEq/L   Potassium 3.9 3.5 - 5.1 mEq/L   Chloride 105 96 - 112 mEq/L   CO2 26 19 - 32 mEq/L   Glucose, Bld 109 (H) 70 - 99 mg/dL   BUN 22 6 - 23 mg/dL   Creatinine, Ser 0.8 0.4 - 1.2 mg/dL   Calcium 9.5 8.4 - 10.5 mg/dL   GFR 72.39 >60.00 mL/min  Orders Placed This Encounter  Procedures  . Basic metabolic panel    [place]

## 2014-08-20 ENCOUNTER — Other Ambulatory Visit: Payer: Self-pay | Admitting: Cardiology

## 2014-08-20 ENCOUNTER — Other Ambulatory Visit: Payer: Self-pay | Admitting: Internal Medicine

## 2014-08-20 NOTE — Telephone Encounter (Signed)
Rx has been sent to the pharmacy electronically. ° °

## 2014-08-22 ENCOUNTER — Other Ambulatory Visit: Payer: Self-pay

## 2014-08-22 MED ORDER — ENALAPRIL MALEATE 20 MG PO TABS: 20.0000 mg | ORAL_TABLET | Freq: Two times a day (BID) | ORAL | Status: DC

## 2014-08-22 NOTE — Telephone Encounter (Signed)
Rx request for enalapril maleate 20 mg tablet-Take 1 tablet by mouth 2 times daily. #60  Pharm: [facility]  Rx sent to pharmacy.

## 2014-09-01 ENCOUNTER — Other Ambulatory Visit: Payer: Self-pay

## 2014-09-01 MED ORDER — ALBUTEROL SULFATE (2.5 MG/3ML) 0.083% IN NEBU: INHALATION_SOLUTION | RESPIRATORY_TRACT | Status: DC

## 2014-09-07 ENCOUNTER — Other Ambulatory Visit: Payer: Self-pay | Admitting: Internal Medicine

## 2014-09-08 ENCOUNTER — Telehealth: Payer: Self-pay | Admitting: Family Medicine

## 2014-09-08 NOTE — Telephone Encounter (Signed)
Pt's insurance will no longer cover albuterol (PROAIR HFA) 108 (90 BASE) MCG/ACT inhaler  Insurance company has suggested  Ventolin HFA  [facility] [place] Pt needs refill please  Also pt's ankles are swelling more than normal and weight has increased a couple of pounds.  Pt has appt in 2 wks, but this may need to be addressed before appt, pls advise

## 2014-09-08 NOTE — Telephone Encounter (Signed)
Is it ok to switch the inhaler? Or would you like for her to schedule a visit to discuss that along with ankle swelling before her appt in 2 weeks?

## 2014-09-08 NOTE — Telephone Encounter (Signed)
You can change inhaler.   Regarding swelling, can have her take lasix twice a day (6 hours apart) for 3 days and if symptoms do not improve, then see me sooner and definitely if symptoms worsen or there are not symptoms

## 2014-09-09 ENCOUNTER — Other Ambulatory Visit: Payer: Self-pay | Admitting: Cardiology

## 2014-09-09 NOTE — Telephone Encounter (Signed)
Rx has been sent to the pharmacy electronically. ° °

## 2014-09-09 NOTE — Telephone Encounter (Signed)
Tried contacting pt but the phone just rang, will try again later.

## 2014-09-10 ENCOUNTER — Other Ambulatory Visit: Payer: Self-pay

## 2014-09-10 MED ORDER — ALBUTEROL SULFATE HFA 108 (90 BASE) MCG/ACT IN AERS: 2.0000 | INHALATION_SPRAY | Freq: Four times a day (QID) | RESPIRATORY_TRACT | Status: DC | PRN

## 2014-09-10 MED ORDER — SERTRALINE HCL 25 MG PO TABS: 25.0000 mg | ORAL_TABLET | Freq: Every day | ORAL | Status: DC

## 2014-09-10 MED ORDER — FLUTICASONE-SALMETEROL 250-50 MCG/DOSE IN AEPB: 1.0000 | INHALATION_SPRAY | Freq: Every day | RESPIRATORY_TRACT | Status: DC | PRN

## 2014-09-10 NOTE — Telephone Encounter (Signed)
Pt.notified

## 2014-09-11 ENCOUNTER — Telehealth: Payer: Self-pay | Admitting: *Deleted

## 2014-09-11 NOTE — Telephone Encounter (Signed)
Fluticasone-Salmeterol (ADVAIR DISKUS) 250-50 MCG/DOSE AEPB  Medication   Date: 09/10/2014  Department: [facility] [place]: [name], MD      Order Providers    Prescribing Provider Encounter Provider   [name], MD Angela Adam, CMA    Medication Detail      Disp Refills Start End     Fluticasone-Salmeterol (ADVAIR DISKUS) 250-50 MCG/DOSE AEPB 60 each 1 09/10/2014     Sig - Route: Inhale 1 puff into the lungs daily as needed. - Inhalation    Notes to Pharmacy: CYCLE FILL MEDICATION. Authorization is required for next refill.    E-Prescribing Status: Receipt confirmed by pharmacy (09/10/2014 4:21 PM EST)     Pharmacy    [place], [place] Encounter   Priority and Order Details

## 2014-09-11 NOTE — Telephone Encounter (Signed)
This was filled yesterday

## 2014-09-19 ENCOUNTER — Other Ambulatory Visit: Payer: Self-pay | Admitting: Physician Assistant

## 2014-09-22 ENCOUNTER — Encounter: Payer: Self-pay | Admitting: Family Medicine

## 2014-09-22 ENCOUNTER — Ambulatory Visit (INDEPENDENT_AMBULATORY_CARE_PROVIDER_SITE_OTHER): Payer: Commercial Managed Care - HMO | Admitting: Family Medicine

## 2014-09-22 VITALS — BP 176/70 | HR 83 | Temp 98.1°F | Wt 170.0 lb

## 2014-09-22 DIAGNOSIS — E785 Hyperlipidemia, unspecified: Secondary | ICD-10-CM | POA: Diagnosis not present

## 2014-09-22 DIAGNOSIS — Z794 Long term (current) use of insulin: Secondary | ICD-10-CM

## 2014-09-22 DIAGNOSIS — L989 Disorder of the skin and subcutaneous tissue, unspecified: Secondary | ICD-10-CM

## 2014-09-22 DIAGNOSIS — Z23 Encounter for immunization: Secondary | ICD-10-CM

## 2014-09-22 DIAGNOSIS — E119 Type 2 diabetes mellitus without complications: Secondary | ICD-10-CM

## 2014-09-22 DIAGNOSIS — I10 Essential (primary) hypertension: Secondary | ICD-10-CM

## 2014-09-22 LAB — LDL CHOLESTEROL, DIRECT: Direct LDL: 29 mg/dL

## 2014-09-22 LAB — HEMOGLOBIN A1C: HEMOGLOBIN A1C: 7.4 % — AB (ref 4.6–6.5)

## 2014-09-22 MED ORDER — AMLODIPINE BESYLATE 10 MG PO TABS: 5.0000 mg | ORAL_TABLET | Freq: Every day | ORAL | Status: DC

## 2014-09-22 NOTE — Assessment & Plan Note (Signed)
Lab Results  Component Value Date   HGBA1C 7.4* 09/22/2014  Given comorbidities and age and difficulty with compliance at times, will hold off on increase in lantus. Continue 12 units and 2g metformin daily.

## 2014-09-22 NOTE — Progress Notes (Signed)
[name], MD Phone: [phone]  Subjective:   Michelle [name] is a 81 y.o. year old very pleasant female patient who presents with the following:  Hypertension-poorly controlled. Possibly on enalapril 20mg  BID, Lasix 20mg , metoprolol 100mg  XL BP Readings from Last 3 Encounters:  09/22/14 176/70  07/25/14 122/62  07/13/14 137/59  Home BP monitoring-up to 190, unsure which medications taking Compliant with medications-yes without side effects ROS-Denies any CP, HA, SOB, blurry vision, LE edema.  Weight has been pretty stable over last month but trended up for a while no worsening shortness of breath.   Diabetes Mellitus- reasonable control Fasting CBGs last visit 130-180. Lantus up to 12 units, continue metformin 1g bid. Average blood sugar 150 with some #s up to 17o or 180 fasting Lab Results  Component Value Date   HGBA1C 7.4* 09/22/2014  ros- no hypoglycemia  Hyperlipidemia-excellent control on crestor 40mg  Regular exercise: no Diet: poor choices ROS- no chest pain or shortness of breath. No myalgias  Skin Lesion Has started irritating within 3 weeks. Has been slowly growing.  ROS- irritates her when she leans on it  Past Medical History- Patient Active Problem List   Diagnosis Date Noted  . Chronic diastolic CHF (congestive heart failure) 03/13/2014    Priority: High  . CAD- RCA PCI '90s, STEMI-RCA DES 02/18/14 03/05/2014    Priority: High  . IDDM type 2 03/26/2007    Priority: High  . Anxiety state 04/21/2014    Priority: Medium  . SOB (shortness of breath) 03/05/2014    Priority: Medium  . Hyperlipidemia with target LDL less than 70 03/26/2007    Priority: Medium  . Essential hypertension 04/21/2006    Priority: Medium  . COPD 04/21/2006    Priority: Medium  . CKD (chronic kidney disease), stage II 07/25/2014    Priority: Low  . Osteoarthritis, knee 05/13/2014    Priority: Low  . Back pain, lumbosacral 10/10/2012    Priority: Low   Medications-  reviewed and updated Current Outpatient Prescriptions  Medication Sig Dispense Refill  . ACCU-CHEK COMPACT PLUS test strip     . acetaminophen (TYLENOL) 325 MG tablet Take 650 mg by mouth every 6 (six) hours as needed for mild pain.    Marland Kitchen albuterol (PROAIR HFA) 108 (90 BASE) MCG/ACT inhaler Inhale 1 puff into the lungs every 4 (four) hours as needed for wheezing or shortness of breath. (Patient not taking: Reported on 07/25/2014) 18 g 5  . albuterol (PROVENTIL) (2.5 MG/3ML) 0.083% nebulizer solution INHALE 1 VIAL USING NEBULIZER FOUR TIMES A DAY AS NEEDED (Patient not taking: Reported on 09/22/2014) 150 mL 1  . aspirin 81 MG tablet     . B-D ULTRAFINE III SHORT PEN 31G X 8 MM MISC USE WITH LANTUS SOLOSTAR 100 each 11  . EFFIENT 10 MG TABS tablet TAKE 1 TABLET (10 MG TOTAL) BY MOUTH DAILY. 30 tablet 1  . enalapril (VASOTEC) 20 MG tablet Take 1 tablet (20 mg total) by mouth 2 (two) times daily. 60 tablet 5  . fenofibrate micronized (LOFIBRA) 134 MG capsule Take 134 mg by mouth daily before breakfast.    . fish oil-omega-3 fatty acids 1000 MG capsule Take 2 g by mouth daily.      . fluticasone (FLONASE) 50 MCG/ACT nasal spray Place 1 spray into both nostrils daily.    . Fluticasone-Salmeterol (ADVAIR DISKUS) 250-50 MCG/DOSE AEPB Inhale 1 puff into the lungs daily as needed. (Patient not taking: Reported on 09/22/2014) 60 each 1  .  folic acid (FOLVITE) 956 MCG tablet Take 400 mcg by mouth daily.      . furosemide (LASIX) 20 MG tablet Take 20 mg by mouth daily.    . Insulin Glargine (LANTUS SOLOSTAR) 100 UNIT/ML Solostar Pen INJECT 10-15 UNITS INTO THE SKIN Daily 15 mL 11  . metFORMIN (GLUCOPHAGE) 1000 MG tablet TAKE 1 TABLET BY MOUTH TWO TIMES A DAY 60 tablet 4  . metoprolol succinate (TOPROL-XL) 100 MG 24 hr tablet Take 100 mg by mouth daily. Take with or immediately following a meal.    . metoprolol succinate (TOPROL-XL) 100 MG 24 hr tablet TAKE 1 TABLET (100 MG TOTAL) BY MOUTH DAILY. TAKE WITH OR  IMMEDIATELY FOLLOWING A MEAL. 30 tablet 5  . nitroGLYCERIN (NITROSTAT) 0.4 MG SL tablet Place 0.4 mg under the tongue every 5 (five) minutes as needed for chest pain.    . pantoprazole (PROTONIX) 40 MG tablet Take 40 mg by mouth daily.    . potassium chloride SA (K-DUR,KLOR-CON) 20 MEQ tablet Take 0.5 tablets (10 mEq total) by mouth daily.    . rosuvastatin (CRESTOR) 40 MG tablet Take 40 mg by mouth daily.    . sertraline (ZOLOFT) 25 MG tablet Take 1 tablet (25 mg total) by mouth daily. 30 tablet 2  . sodium chloride (OCEAN) 0.65 % SOLN nasal spray Place 1 spray into both nostrils as needed for congestion.    Marland Kitchen tiotropium (SPIRIVA) 18 MCG inhalation capsule Place 18 mcg into inhaler and inhale daily.    . traMADol (ULTRAM) 50 MG tablet Take 1 tablet (50 mg total) by mouth every 6 (six) hours as needed for moderate pain. (Patient not taking: Reported on 07/25/2014) 30 tablet 0  . vitamin E 400 UNIT capsule Take 400 Units by mouth daily.       No current facility-administered medications for this visit.    Objective: BP 176/70 mmHg  Pulse 83  Temp(Src) 98.1 F (36.7 C)  Wt 170 lb (77.111 kg)  SpO2 94% Gen: NAD, resting comfortably in wheelchair but able to stand CV: RRR no murmurs rubs or gallops Lungs: CTAB no crackles, wheeze, rhonchi Abdomen: soft/nontender/nondistended/normal bowel sounds.  Ext: no edema Skin: warm, dry On right upper back aproximately 1.5 x 1.5 cm horned lesion raised at least 2 mm off skin with underlying erythema   Assessment/Plan:  Essential hypertension Very poor control but Well controlled last visit on Enalapril 20mg  BID, Lasix 20mg , metoprolol 100mg  XL but patient unclear what she is taking. I circled these 3 meds and if she is already taking them, she is going to add amlodipine (otherwise will let me know) with follow up within a week. Amlodipine 5mg  only but not best choice with heart failure and history or edema-i dont  Think hctz is ideal given already on  lasix though. Could consider spironolactone    IDDM type 2 Lab Results  Component Value Date   HGBA1C 7.4* 09/22/2014  Given comorbidities and age and difficulty with compliance at times, will hold off on increase in lantus. Continue 12 units and 2g metformin daily.     Hyperlipidemia with target LDL less than 70 Excellent control with ldl <70 on crestor 40mg    Skin lesion on R upper back-inflamed seborrheic keratosis vs. Squamous cell carcinoma. Refer to derm for excision.   Return precautions advised with planned follow up within a week. Also check in on proair prior auth status-was told by insurance would not pay for proair but would ventolin but later rejected  ventolin.   Orders Placed This Encounter  Procedures  . Pneumococcal conjugate vaccine 13-valent  . Ambulatory referral to Dermatology    Referral Priority:  Routine    Referral Type:  Consultation    Referral Reason:  Specialty Services Required    Requested Specialty:  Dermatology    Number of Visits Requested:  1   Results for orders placed or performed in visit on 09/22/14 (from the past 24 hour(s))  LDL cholesterol, direct     Status: None   Collection Time: 09/22/14  3:00 PM  Result Value Ref Range   Direct LDL 29.0 mg/dL  Hemoglobin A1c     Status: Abnormal   Collection Time: 09/22/14  3:00 PM  Result Value Ref Range   Hgb A1c MFr Bld 7.4 (H) 4.6 - 6.5 %   Meds ordered this encounter  Medications  . amLODipine (NORVASC) 10 MG tablet    Sig: Take 0.5 tablets (5 mg total) by mouth daily.    Dispense:  30 tablet    Refill:  3

## 2014-09-22 NOTE — Patient Instructions (Signed)
Blood pressure very poorly controlled. Check at home to see if you are taking 3 circled medications. If you are add amlodipine 5mg  (1/2 tab) and see me back within a week. If still elevated may have you take a full tab. We have to watch out as this can cause ankle swelling  Check labs  Refer to derm for spot on back which could be skin cancer

## 2014-09-22 NOTE — Assessment & Plan Note (Signed)
Excellent control with ldl <70 on crestor 40mg 

## 2014-09-22 NOTE — Assessment & Plan Note (Addendum)
Very poor control but Well controlled last visit on Enalapril 20mg  BID, Lasix 20mg , metoprolol 100mg  XL but patient unclear what she is taking. I circled these 3 meds and if she is already taking them, she is going to add amlodipine (otherwise will let me know) with follow up within a week. Amlodipine 5mg  only but not best choice with heart failure and history or edema-i dont  Think hctz is ideal given already on lasix though. Could consider spironolactone

## 2014-09-23 ENCOUNTER — Telehealth: Payer: Self-pay | Admitting: Family Medicine

## 2014-09-23 NOTE — Telephone Encounter (Signed)
I called patient to let her know the PA for Proair has been approved.  Patient verbalized understanding.

## 2014-09-26 ENCOUNTER — Other Ambulatory Visit: Payer: Self-pay

## 2014-09-26 ENCOUNTER — Encounter: Payer: Self-pay | Admitting: Family Medicine

## 2014-09-26 ENCOUNTER — Ambulatory Visit (INDEPENDENT_AMBULATORY_CARE_PROVIDER_SITE_OTHER): Payer: Commercial Managed Care - HMO | Admitting: Family Medicine

## 2014-09-26 VITALS — BP 142/60 | HR 85 | Temp 98.3°F | Wt 170.0 lb

## 2014-09-26 DIAGNOSIS — I10 Essential (primary) hypertension: Secondary | ICD-10-CM

## 2014-09-26 MED ORDER — TRAMADOL HCL 50 MG PO TABS: 50.0000 mg | ORAL_TABLET | Freq: Four times a day (QID) | ORAL | Status: DC | PRN

## 2014-09-26 MED ORDER — AMLODIPINE BESYLATE 10 MG PO TABS: 10.0000 mg | ORAL_TABLET | Freq: Every day | ORAL | Status: DC

## 2014-09-26 NOTE — Patient Instructions (Addendum)
Refill tramadol   Blood pressure still not quite there. Take full pill of the amlodipine  See me back in 1 month, watch out for swelling and come see me if it occurs. May have to go back to lower dose amlodipine and try another medication.   If you cannot get to the dermatologist, call over here for a procedure appointment (30 minutes) and we can shave the lesion off your back and make sure it is not cancer. If it is, would still likely need to see dermatology

## 2014-09-26 NOTE — Assessment & Plan Note (Signed)
Improved control on Enalapril 20mg  BID, Lasix 20mg , metoprolol 100mg  XL, Amlodipine 5mg . Home control not excellent and with CAD history really need <140 SBP so increase amlodipine to 10mg . Discussed my hestitancy with her heart failure history. Had thought of using spironolactone but would have to be careful with ace i and hyperkalemia risk.

## 2014-09-26 NOTE — Progress Notes (Signed)
[name], MD Phone: [phone]  Subjective:   Michelle [name] is a 81 y.o. year old very pleasant female patient who presents with the following:  Hypertension-mild poor control  BP Readings from Last 3 Encounters:  09/26/14 142/60  09/22/14 176/70  07/25/14 122/62   Home BP monitoring-yes and #s in 170s usually Compliant with medications-yes without side effects ROS-Denies any CP, HA, SOB, blurry vision, LE edema. Knee pain noted-requests tramadol refill  Past Medical History- Patient Active Problem List   Diagnosis Date Noted  . Chronic diastolic CHF (congestive heart failure) 03/13/2014    Priority: High  . CAD- RCA PCI '90s, STEMI-RCA DES 02/18/14 03/05/2014    Priority: High  . IDDM type 2 03/26/2007    Priority: High  . Anxiety state 04/21/2014    Priority: Medium  . SOB (shortness of breath) 03/05/2014    Priority: Medium  . Hyperlipidemia with target LDL less than 70 03/26/2007    Priority: Medium  . Essential hypertension 04/21/2006    Priority: Medium  . COPD 04/21/2006    Priority: Medium  . CKD (chronic kidney disease), stage II 07/25/2014    Priority: Low  . Osteoarthritis, knee 05/13/2014    Priority: Low  . Back pain, lumbosacral 10/10/2012    Priority: Low   Medications- reviewed and updated Current Outpatient Prescriptions  Medication Sig Dispense Refill  . amLODipine (NORVASC) 10 MG tablet Take 0.5 tablets (5 mg total) by mouth daily. 30 tablet 3  . aspirin 81 MG tablet     . B-D ULTRAFINE III SHORT PEN 31G X 8 MM MISC USE WITH LANTUS SOLOSTAR 100 each 11  . EFFIENT 10 MG TABS tablet TAKE 1 TABLET (10 MG TOTAL) BY MOUTH DAILY. 30 tablet 1  . enalapril (VASOTEC) 20 MG tablet Take 1 tablet (20 mg total) by mouth 2 (two) times daily. 60 tablet 5  . fenofibrate micronized (LOFIBRA) 134 MG capsule Take 134 mg by mouth daily before breakfast.    . fish oil-omega-3 fatty acids 1000 MG capsule Take 2 g by mouth daily.      . fluticasone (FLONASE)  50 MCG/ACT nasal spray Place 1 spray into both nostrils daily.    . Fluticasone-Salmeterol (ADVAIR DISKUS) 250-50 MCG/DOSE AEPB Inhale 1 puff into the lungs daily as needed. 60 each 1  . folic acid (FOLVITE) 562 MCG tablet Take 400 mcg by mouth daily.      . furosemide (LASIX) 20 MG tablet Take 20 mg by mouth daily.    . Insulin Glargine (LANTUS SOLOSTAR) 100 UNIT/ML Solostar Pen INJECT 10-15 UNITS INTO THE SKIN Daily 15 mL 11  . metFORMIN (GLUCOPHAGE) 1000 MG tablet TAKE 1 TABLET BY MOUTH TWO TIMES A DAY 60 tablet 4  . metoprolol succinate (TOPROL-XL) 100 MG 24 hr tablet Take 100 mg by mouth daily. Take with or immediately following a meal.    . pantoprazole (PROTONIX) 40 MG tablet Take 40 mg by mouth daily.    . potassium chloride SA (K-DUR,KLOR-CON) 20 MEQ tablet Take 0.5 tablets (10 mEq total) by mouth daily.    . rosuvastatin (CRESTOR) 40 MG tablet Take 40 mg by mouth daily.    . sertraline (ZOLOFT) 25 MG tablet Take 1 tablet (25 mg total) by mouth daily. 30 tablet 2  . sodium chloride (OCEAN) 0.65 % SOLN nasal spray Place 1 spray into both nostrils as needed for congestion.    Marland Kitchen tiotropium (SPIRIVA) 18 MCG inhalation capsule Place 18 mcg into inhaler and  inhale daily.    . vitamin E 400 UNIT capsule Take 400 Units by mouth daily.      Marland Kitchen ACCU-CHEK COMPACT PLUS test strip     . acetaminophen (TYLENOL) 325 MG tablet Take 650 mg by mouth every 6 (six) hours as needed for mild pain.    Marland Kitchen albuterol (PROAIR HFA) 108 (90 BASE) MCG/ACT inhaler Inhale 1 puff into the lungs every 4 (four) hours as needed for wheezing or shortness of breath. (Patient not taking: Reported on 09/26/2014) 18 g 5  . albuterol (PROVENTIL) (2.5 MG/3ML) 0.083% nebulizer solution INHALE 1 VIAL USING NEBULIZER FOUR TIMES A DAY AS NEEDED (Patient not taking: Reported on 09/26/2014) 150 mL 1  . nitroGLYCERIN (NITROSTAT) 0.4 MG SL tablet Place 0.4 mg under the tongue every 5 (five) minutes as needed for chest pain.    . traMADol  (ULTRAM) 50 MG tablet Take 1 tablet (50 mg total) by mouth every 6 (six) hours as needed for moderate pain. (Patient not taking: Reported on 07/25/2014) 30 tablet 0   Objective: BP 142/60 mmHg  Pulse 85  Temp(Src) 98.3 F (36.8 C)  Wt 170 lb (77.111 kg) Gen: NAD, resting comfortably in wheelchair CV: RRR no murmurs rubs or gallops Lungs: CTAB no crackles, wheeze, rhonchi Abdomen: soft/nontender/nondistended/normal bowel sounds.  Ext: no edema (despite amlodipine) Skin: warm, dry, scab on back has pulled off, not actively bleeding but very concerning for squamous cell cancer (already has derm appointment)   Assessment/Plan:  Essential hypertension Improved control on Enalapril 20mg  BID, Lasix 20mg , metoprolol 100mg  XL, Amlodipine 5mg . Home control not excellent and with CAD history really need <140 SBP so increase amlodipine to 10mg . Discussed my hestitancy with her heart failure history. Had thought of using spironolactone but would have to be careful with ace i and hyperkalemia risk.    1 month f/u either here or at cardiology with sooner return precautions discussed.   Meds ordered this encounter  Medications  . amLODipine (NORVASC) 10 MG tablet    Sig: Take 1 tablet (10 mg total) by mouth daily.    Dispense:  30 tablet    Refill:  5  . traMADol (ULTRAM) 50 MG tablet    Sig: Take 1 tablet (50 mg total) by mouth every 6 (six) hours as needed for moderate pain.    Dispense:  45 tablet    Refill:  5    CYCLE FILL MEDICATION. Authorization is required for next refill.

## 2014-10-03 ENCOUNTER — Other Ambulatory Visit: Payer: Self-pay | Admitting: Family Medicine

## 2014-10-03 MED ORDER — ALBUTEROL SULFATE (2.5 MG/3ML) 0.083% IN NEBU: INHALATION_SOLUTION | RESPIRATORY_TRACT | Status: DC

## 2014-10-03 NOTE — Telephone Encounter (Signed)
Refills will be placed on file for future use

## 2014-10-09 ENCOUNTER — Other Ambulatory Visit: Payer: Self-pay

## 2014-10-09 DIAGNOSIS — D045 Carcinoma in situ of skin of trunk: Secondary | ICD-10-CM | POA: Diagnosis not present

## 2014-10-09 DIAGNOSIS — C44529 Squamous cell carcinoma of skin of other part of trunk: Secondary | ICD-10-CM | POA: Diagnosis not present

## 2014-10-13 ENCOUNTER — Telehealth: Payer: Self-pay | Admitting: Family Medicine

## 2014-10-13 NOTE — Telephone Encounter (Signed)
No would need to be seen

## 2014-10-13 NOTE — Telephone Encounter (Signed)
You took pt off of this back on 06/03/14. Ok to refill?

## 2014-10-13 NOTE — Telephone Encounter (Signed)
Refill request for Lorazepam 0.5 mg take 1/2 - 1 po qd prn and send to [facility].

## 2014-10-14 NOTE — Telephone Encounter (Signed)
Refill denied, pt needs OV to discuss being put back on this med.

## 2014-10-17 ENCOUNTER — Telehealth: Payer: Self-pay

## 2014-10-17 MED ORDER — FUROSEMIDE 20 MG PO TABS: 20.0000 mg | ORAL_TABLET | Freq: Every day | ORAL | Status: DC

## 2014-10-17 NOTE — Telephone Encounter (Signed)
Medication called in 

## 2014-10-17 NOTE — Telephone Encounter (Signed)
[facility] refill request for FUROSEMIDE 40MG  TAB

## 2014-10-27 ENCOUNTER — Telehealth: Payer: Self-pay

## 2014-10-27 NOTE — Telephone Encounter (Signed)
Refill denied, pt needs OV to discuss being put back on this medicine.

## 2014-10-27 NOTE — Telephone Encounter (Signed)
[facility] refill request for LORAZEPAM 0.5MG  TAB. Last filled 06/21/2014

## 2014-10-30 ENCOUNTER — Ambulatory Visit (INDEPENDENT_AMBULATORY_CARE_PROVIDER_SITE_OTHER): Payer: Commercial Managed Care - HMO | Admitting: Cardiovascular Disease

## 2014-10-30 ENCOUNTER — Encounter: Payer: Self-pay | Admitting: Cardiovascular Disease

## 2014-10-30 VITALS — BP 152/56 | HR 83 | Ht 65.0 in | Wt 174.0 lb

## 2014-10-30 DIAGNOSIS — I2510 Atherosclerotic heart disease of native coronary artery without angina pectoris: Secondary | ICD-10-CM

## 2014-10-30 DIAGNOSIS — I10 Essential (primary) hypertension: Secondary | ICD-10-CM | POA: Diagnosis not present

## 2014-10-30 DIAGNOSIS — I5032 Chronic diastolic (congestive) heart failure: Secondary | ICD-10-CM | POA: Diagnosis not present

## 2014-10-30 NOTE — Progress Notes (Signed)
Chief Complaint  Patient presents with  . Shortness of Breath     History of Present Illness: 81 y.o. female with a hx of diastolic CHF, CAD s/p PTCA of a large dominant RCA in 1990 and inferior STEMI August 2015, DM, HTN, HLD, COPD, former tobacco abuse here today for cardiac follow up. She was admitted with an Inferior STEMI 8/4-02/21/14. Emergent cardiac catheterization demonstrated an occluded RCA with aneurysm formation at the prior angioplasty site. Aneurysm was treated with a Covered stent. Occlusion was treated with 2 drug-eluting stents (one proximal and one distal to the covered stent). There was a small pericardial effusion noted post MI. She was DC on short course of steroids and FU echo demonstrated just trivial effusion. Readmitted 8/19-8/20/15 with dyspnea that improved with one dose of IV Lasix. Echo demonstrated normal LVF and no significant pericardial effusion. Readmitted 8/18-5/63/14 with a/c diastolic CHF. Diuresis was complicated by AKI and ACEI was held. She became dyspneic on Brilinta and she was changed to Effient.    She is here today for follow up. She denies chest discomfort with exertion.  She ambulates with a walker. She has no energy. No SOB. Weight stable at home.   Primary Care Physician: [name]  Last Lipid Profile:Lipid Panel     Component Value Date/Time   CHOL 147 02/19/2014 0228   TRIG 521* 02/19/2014 0228   TRIG 1693* 07/28/2006 1202   HDL 43 02/19/2014 0228   CHOLHDL 3.4 02/19/2014 0228   CHOLHDL 9.2 CALC 07/28/2006 1202   VLDL UNABLE TO CALCULATE IF TRIGLYCERIDE OVER 400 mg/dL 02/19/2014 0228   LDLCALC UNABLE TO CALCULATE IF TRIGLYCERIDE OVER 400 mg/dL 02/19/2014 9702     Past Medical History  Diagnosis Date  . COPD (chronic obstructive pulmonary disease)   . CAD (coronary artery disease) 1990; 2015    Cardiac cath 1990 with Dr. Lia Foyer and pt reports blockage in artery  with angioplasty. She has pictures that show severe stenosis  mid RCA and a post PTCA picture with 30% residual stenosis post PTCA. Residual CAD, non obstructive per 2015 cath. STEMI status post stent in August 2015.  Marland Kitchen Hyperlipidemia with target LDL less than 70   . Essential hypertension   . Diabetes mellitus type 2, insulin dependent   . AV block, 1st degree   . 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 Myocardial infarction   . Anginal pain   . Shortness of breath   . Arthritis   . Pericarditis-post MI (short course of steroids) 03/06/2014    Past Surgical History  Procedure Laterality Date  . Cholecystectomy    . Cataract extraction  2010  . Ptca  1990    PTCA of RCA  . Transthoracic echocardiogram  02/02/2012    mild LVH, EF 55-60%, Normal WM, Gr 1 DD; Mild MR  . Coronary angioplasty with stent placement  02/18/14    Promus DES to RCA  . Left heart catheterization with coronary angiogram N/A 02/18/2014    Procedure: LEFT HEART CATHETERIZATION WITH CORONARY ANGIOGRAM;  Surgeon: Jettie Booze, MD;  Location: [facility] CATH LAB;  Service: Cardiovascular;  Laterality: N/A;    Current Outpatient Prescriptions  Medication Sig Dispense Refill  . ACCU-CHEK COMPACT PLUS test strip     . acetaminophen (TYLENOL) 325 MG tablet Take 650 mg by mouth every 6 (six) hours as needed for mild pain.    Marland Kitchen  albuterol (PROAIR HFA) 108 (90 BASE) MCG/ACT inhaler Inhale 1 puff into the lungs every 4 (four) hours as needed for wheezing or shortness of breath. 18 g 5  . albuterol (PROVENTIL) (2.5 MG/3ML) 0.083% nebulizer solution Take 2.5 mg by nebulization every 6 (six) hours as needed for wheezing or shortness of breath (for wheezing).    Marland Kitchen amLODipine (NORVASC) 10 MG tablet Take 1 tablet (10 mg total) by mouth daily. 30 tablet 5  . aspirin 81 MG tablet     . B-D ULTRAFINE III SHORT PEN 31G X 8 MM MISC USE WITH LANTUS SOLOSTAR 100 each 11  . EFFIENT 10 MG TABS tablet TAKE 1 TABLET (10 MG TOTAL)  BY MOUTH DAILY. 30 tablet 1  . enalapril (VASOTEC) 20 MG tablet Take 1 tablet (20 mg total) by mouth 2 (two) times daily. 60 tablet 5  . fenofibrate micronized (LOFIBRA) 134 MG capsule Take 134 mg by mouth daily before breakfast.    . fish oil-omega-3 fatty acids 1000 MG capsule Take 2 g by mouth daily.      . Fluticasone-Salmeterol (ADVAIR DISKUS) 250-50 MCG/DOSE AEPB Inhale 1 puff into the lungs daily as needed. (Patient taking differently: Inhale 1 puff into the lungs daily as needed (wheezing). ) 60 each 1  . folic acid (FOLVITE) 132 MCG tablet Take 400 mcg by mouth daily.      . furosemide (LASIX) 20 MG tablet Take 1 tablet (20 mg total) by mouth daily. 30 tablet 6  . Insulin Glargine (LANTUS SOLOSTAR) 100 UNIT/ML Solostar Pen INJECT 10-15 UNITS INTO THE SKIN Daily 15 mL 11  . metFORMIN (GLUCOPHAGE) 1000 MG tablet TAKE 1 TABLET BY MOUTH TWO TIMES A DAY 60 tablet 4  . metoprolol succinate (TOPROL-XL) 100 MG 24 hr tablet Take 100 mg by mouth daily. Take with or immediately following a meal.    . nitroGLYCERIN (NITROSTAT) 0.4 MG SL tablet Place 0.4 mg under the tongue every 5 (five) minutes as needed for chest pain.    . pantoprazole (PROTONIX) 40 MG tablet Take 40 mg by mouth daily.    . potassium chloride SA (K-DUR,KLOR-CON) 20 MEQ tablet Take 0.5 tablets (10 mEq total) by mouth daily.    . rosuvastatin (CRESTOR) 40 MG tablet Take 40 mg by mouth daily.    . sertraline (ZOLOFT) 25 MG tablet Take 1 tablet (25 mg total) by mouth daily. 30 tablet 2  . sodium chloride (OCEAN) 0.65 % SOLN nasal spray Place 1 spray into both nostrils as needed for congestion.    Marland Kitchen tiotropium (SPIRIVA) 18 MCG inhalation capsule Place 18 mcg into inhaler and inhale daily.    . traMADol (ULTRAM) 50 MG tablet Take 1 tablet (50 mg total) by mouth every 6 (six) hours as needed for moderate pain. 45 tablet 5  . vitamin E 400 UNIT capsule Take 400 Units by mouth daily.       No current facility-administered medications for  this visit.    Allergies  Allergen Reactions  . Codeine Sulfate Nausea Only  . Morphine Sulfate Nausea Only    History   Social History  . Marital Status: Married    Spouse Name: N/A  . Number of Children: 2  . Years of Education: N/A   Occupational History  . Retired    Social History Main Topics  . Smoking status: Former Smoker -- 1.00 packs/day for 55 years    Types: Cigarettes    Quit date: 09/29/2006  . Smokeless tobacco: Never  Used  . Alcohol Use: No  . Drug Use: No  . Sexual Activity: Not on file   Other Topics Concern  . Not on file   Social History Narrative   Lives with her son but he works all day. Home for dinner.  Independent ADL. Needs assist on some IADL.     Family History  Problem Relation Age of Onset  . Heart attack Mother 75  . Cancer Father 54  . Heart attack Son     Review of Systems:  As stated in the HPI and otherwise negative.   BP 152/56 mmHg  Pulse 83  Ht 5\' 5"  (1.651 m)  Wt 174 lb (78.926 kg)  BMI 28.96 kg/m2  SpO2 97%  Physical Examination: General: Well developed, well nourished, NAD HEENT: OP clear, mucus membranes moist SKIN: warm, dry. No rashes. Neuro: No focal deficits Musculoskeletal: Muscle strength 5/5 all ext Psychiatric: Mood and affect normal Neck: No JVD, no carotid bruits, no thyromegaly, no lymphadenopathy. Lungs:Clear bilaterally, no wheezes, rhonci, crackles Cardiovascular: Regular rate and rhythm. Soft systolic murmurs, gallops or rubs. Abdomen:Soft. Bowel sounds present. Non-tender.  Extremities: No lower extremity edema. Pulses are 2 + in the bilateral DP/PT.  EKG:  EKG is not ordered today. The ekg ordered today demonstrates  Recent Labs: 03/05/2014: Magnesium 1.5; TSH 0.936 03/15/2014: ALT 20 03/17/2014: Hemoglobin 10.9*; Platelets 250 03/19/2014: Pro B Natriuretic peptide (BNP) 124.0* 07/25/2014: BUN 22; Creatinine 0.8; Potassium 3.9; Sodium 139   Lipid Panel    Component Value Date/Time   CHOL  147 02/19/2014 0228   TRIG 521* 02/19/2014 0228   TRIG 1693* 07/28/2006 1202   HDL 43 02/19/2014 0228   CHOLHDL 3.4 02/19/2014 0228   CHOLHDL 9.2 CALC 07/28/2006 1202   VLDL UNABLE TO CALCULATE IF TRIGLYCERIDE OVER 400 mg/dL 02/19/2014 0228   LDLCALC UNABLE TO CALCULATE IF TRIGLYCERIDE OVER 400 mg/dL 02/19/2014 0228   LDLDIRECT 29.0 09/22/2014 1500   LDLDIRECT 18.6 07/28/2006 1202     Wt Readings from Last 3 Encounters:  10/30/14 174 lb (78.926 kg)  09/26/14 170 lb (77.111 kg)  09/22/14 170 lb (77.111 kg)     Other studies Reviewed: Additional studies/ records that were reviewed today include:. Review of the above records demonstrates:    Assessment and Plan:   1. CAD: Stable.Continue ASA, Effient, statin and beta blocker.   3. HTN: BP stable. Managed in primary care.   4. Hyperlipidemia: On statin.   5. Chronic diastolic CHF: Weight is stable. Continue Lasix and use extra 20 mg lasix on days that her weight is up or she is dyspneic.   Current medicines are reviewed at length with the patient today.  The patient does not have concerns regarding medicines.  The following changes have been made:  no change  Labs/ tests ordered today include: No orders of the defined types were placed in this encounter.    Disposition:   FU with me in 6 months  Signed, Lauree Chandler, MD 10/30/2014 4:07 PM    [facility] [address], [place], [place]  [address] Phone: [phone]; Fax: [phone]

## 2014-10-30 NOTE — Patient Instructions (Signed)
Medication Instructions:  Your physician recommends that you continue on your current medications as directed. Please refer to the Current Medication list given to you today.   Labwork: none  Testing/Procedures: none  Follow-Up: Your physician wants you to follow-up in: 6 months.  You will receive a reminder letter in the mail two months in advance. If you don't receive a letter, please call our office to schedule the follow-up appointment.       

## 2014-11-03 ENCOUNTER — Telehealth: Payer: Self-pay

## 2014-11-03 NOTE — Telephone Encounter (Signed)
Refill denied, pt needs OV to discuss being put back on this medication.

## 2014-11-03 NOTE — Telephone Encounter (Signed)
Michelle [name] refill request for LORAZEPAM 0.5MG  TAB

## 2014-11-05 ENCOUNTER — Telehealth: Payer: Self-pay | Admitting: Family Medicine

## 2014-11-05 MED ORDER — ALBUTEROL SULFATE (2.5 MG/3ML) 0.083% IN NEBU: 2.5000 mg | INHALATION_SOLUTION | Freq: Four times a day (QID) | RESPIRATORY_TRACT | Status: DC | PRN

## 2014-11-05 NOTE — Telephone Encounter (Signed)
Pt cb and DOES NOT need the inhaler. Pt needs  albuterol (PROVENTIL) (2.5 MG/3ML) 0.083% nebulizer solution   [facility]/ [address]   Pt close to being out .

## 2014-11-05 NOTE — Telephone Encounter (Signed)
Pt request refill of the following: albuterol (PROAIR HFA) 108 (90 BASE) MCG/ACT inhaler   Phamacy: [facility]

## 2014-11-05 NOTE — Telephone Encounter (Signed)
Nebulizer solution refilled 

## 2014-11-20 ENCOUNTER — Other Ambulatory Visit: Payer: Self-pay | Admitting: Cardiovascular Disease

## 2014-11-21 DIAGNOSIS — L570 Actinic keratosis: Secondary | ICD-10-CM | POA: Diagnosis not present

## 2014-11-21 DIAGNOSIS — L821 Other seborrheic keratosis: Secondary | ICD-10-CM | POA: Diagnosis not present

## 2014-11-21 DIAGNOSIS — Z08 Encounter for follow-up examination after completed treatment for malignant neoplasm: Secondary | ICD-10-CM | POA: Diagnosis not present

## 2014-11-21 DIAGNOSIS — Z85828 Personal history of other malignant neoplasm of skin: Secondary | ICD-10-CM | POA: Diagnosis not present

## 2014-11-27 ENCOUNTER — Other Ambulatory Visit: Payer: Self-pay | Admitting: Cardiology

## 2014-11-27 ENCOUNTER — Other Ambulatory Visit: Payer: Self-pay | Admitting: *Deleted

## 2014-11-27 MED ORDER — PANTOPRAZOLE SODIUM 40 MG PO TBEC
40.0000 mg | DELAYED_RELEASE_TABLET | Freq: Every day | ORAL | Status: DC
Start: 2014-11-27 — End: 2015-06-13

## 2014-11-28 ENCOUNTER — Ambulatory Visit (INDEPENDENT_AMBULATORY_CARE_PROVIDER_SITE_OTHER): Payer: Commercial Managed Care - HMO | Admitting: Family Medicine

## 2014-11-28 ENCOUNTER — Encounter: Payer: Self-pay | Admitting: Family Medicine

## 2014-11-28 VITALS — BP 130/52 | HR 82 | Temp 98.2°F | Wt 178.0 lb

## 2014-11-28 DIAGNOSIS — E119 Type 2 diabetes mellitus without complications: Secondary | ICD-10-CM

## 2014-11-28 DIAGNOSIS — Z794 Long term (current) use of insulin: Secondary | ICD-10-CM

## 2014-11-28 DIAGNOSIS — M25561 Pain in right knee: Secondary | ICD-10-CM

## 2014-11-28 DIAGNOSIS — M25562 Pain in left knee: Secondary | ICD-10-CM

## 2014-11-28 DIAGNOSIS — M170 Bilateral primary osteoarthritis of knee: Secondary | ICD-10-CM

## 2014-11-28 DIAGNOSIS — I5032 Chronic diastolic (congestive) heart failure: Secondary | ICD-10-CM

## 2014-11-28 DIAGNOSIS — I10 Essential (primary) hypertension: Secondary | ICD-10-CM

## 2014-11-28 MED ORDER — TRAMADOL HCL 50 MG PO TABS: 50.0000 mg | ORAL_TABLET | Freq: Four times a day (QID) | ORAL | Status: DC | PRN

## 2014-11-28 NOTE — Assessment & Plan Note (Signed)
Steroid injections have not helped in the past x1 though oral prednisone had been at one point. Patient was considering visit to [place] for intraarticular hyaluronate injections. I offered patient referral to our sports medicine Dr. Tamala Julian and she preferred this option. Referred at this time. Patient wants cost of visit to be as low as possible, so she will discuss this with Dr. Era Bumpers avoid ultrasound if possible to lower cost.

## 2014-11-28 NOTE — Patient Instructions (Addendum)
Your weight is up 8 lbs from last visit-some slightly from fluid but also I think those nutty buddy's are not helping. Let's at least cut the nutty buddy's in half if not completely out. Also, Take a lasix around 1 or 2 pm today then continue previous regimen.   Refer to Dr. Tamala Julian of sports medicine , should hear within a week  Increase Lantus to 15 units a day. Let me know if you get any sugars less than 100 or over 200.   Blood pressure looks better but have to monitor closely. Goal <140/90  Make sure to see your eye doctor and send me a copy of report  See you in 2 months unless you need me

## 2014-11-28 NOTE — Assessment & Plan Note (Signed)
Mild poor control on 14 units lantus, titrate to 15 units. Continue metformin. 2 month follow up and a1c at that time. F/u if fastings <100 or >200 before visit

## 2014-11-28 NOTE — Assessment & Plan Note (Signed)
Controlled today on Enalapril 20mg  BID, Lasix 20mg , metoprolol 100mg  XL, Amlodipine 10mg . 2 month f/u.

## 2014-11-28 NOTE — Assessment & Plan Note (Signed)
Weight up-some from fluid, some from overeating. 1 extra dose of lasix to be given today then continue previous use with return precautions advised.

## 2014-11-28 NOTE — Progress Notes (Signed)
Michelle Reddish, MD  Subjective:  Michelle [name] is a 81 y.o. year old very pleasant female patient who presents with:  Bilateral osteoarthritis Both knees hurt even at rest, worse if she walks. Years of knee pain, tried steroid injection with no relief. Not very interested in surgery. Short course of prednisone by me this year helped some then pain recurred. She is interested in Synvisc or similar. Asks me to consider referral to [place] which son saw through [facility].   Diastolic CHF Weight up 8 lbs from last visit here 1 extra lasix 6 hours after dose around 1 or 2 pm No increased shortness of breath  Hypertension-improved control after increase to amlodipine 10mg  last visit  BP Readings from Last 3 Encounters:  11/28/14 130/52  10/30/14 152/56  09/26/14 142/60  Compliant with medications-yes without side effects ROS-Denies any HA, SOB, blurry vision. Did have 1 brief episode CP resolving with nitro last week but no overall increasing usage.   DIABETES Type II-suspect worsening control  Lab Results  Component Value Date   HGBA1C 7.4* 09/22/2014   HGBA1C 7.5* 06/20/2014   HGBA1C 6.4* 03/14/2014  Medications taking and tolerating-Lantus 14 units, metformin 1g BID Blood Sugars per patient-fasting-148-191 Diet-has increased, using nutty butty bar every morning. Appetite is up  Regular Exercise-recommend chair exercise Health Maintenance Due  Topic Date Due  . OPHTHALMOLOGY EXAM - advised to schedule 11/16/2014   ROS- Denies Vision changes, feet or hand numbness/pain/tingling. Denies Hypoglycemia symptoms (shaky, sweaty, hungry, weak anxious, tremor, palpitations, confusion, behavior change).   Past Medical History- CAD, HLD, COPD, anxiety  Medications- reviewed and updated Current Outpatient Prescriptions  Medication Sig Dispense Refill  . ACCU-CHEK COMPACT PLUS test strip     . amLODipine (NORVASC) 10 MG tablet Take 1 tablet (10 mg total) by mouth daily. 30  tablet 5  . aspirin 81 MG tablet     . B-D ULTRAFINE III SHORT PEN 31G X 8 MM MISC USE WITH LANTUS SOLOSTAR 100 each 11  . EFFIENT 10 MG TABS tablet TAKE 1 TABLET (10 MG TOTAL) BY MOUTH DAILY. 30 tablet 5  . enalapril (VASOTEC) 20 MG tablet Take 1 tablet (20 mg total) by mouth 2 (two) times daily. 60 tablet 5  . fenofibrate micronized (LOFIBRA) 134 MG capsule Take 134 mg by mouth daily before breakfast.    . fish oil-omega-3 fatty acids 1000 MG capsule Take 2 g by mouth daily.      . Fluticasone-Salmeterol (ADVAIR DISKUS) 250-50 MCG/DOSE AEPB Inhale 1 puff into the lungs daily as needed. (Patient taking differently: Inhale 1 puff into the lungs daily as needed (wheezing). ) 60 each 1  . folic acid (FOLVITE) 465 MCG tablet Take 400 mcg by mouth daily.      . furosemide (LASIX) 20 MG tablet Take 1 tablet (20 mg total) by mouth daily. 30 tablet 6  . Insulin Glargine (LANTUS SOLOSTAR) 100 UNIT/ML Solostar Pen INJECT 10-15 UNITS INTO THE SKIN Daily 15 mL 11  . metFORMIN (GLUCOPHAGE) 1000 MG tablet TAKE 1 TABLET BY MOUTH TWO TIMES A DAY 60 tablet 4  . metoprolol succinate (TOPROL-XL) 100 MG 24 hr tablet Take 100 mg by mouth daily. Take with or immediately following a meal.    . pantoprazole (PROTONIX) 40 MG tablet Take 1 tablet (40 mg total) by mouth daily. 30 tablet 6  . potassium chloride SA (K-DUR,KLOR-CON) 20 MEQ tablet Take 0.5 tablets (10 mEq total) by mouth daily.    Marland Kitchen  rosuvastatin (CRESTOR) 40 MG tablet Take 40 mg by mouth daily.    . sertraline (ZOLOFT) 25 MG tablet Take 1 tablet (25 mg total) by mouth daily. 30 tablet 2  . tiotropium (SPIRIVA) 18 MCG inhalation capsule Place 18 mcg into inhaler and inhale daily.    . vitamin E 400 UNIT capsule Take 400 Units by mouth daily.      Marland Kitchen acetaminophen (TYLENOL) 325 MG tablet Take 650 mg by mouth every 6 (six) hours as needed for mild pain.    Marland Kitchen albuterol (PROAIR HFA) 108 (90 BASE) MCG/ACT inhaler Inhale 1 puff into the lungs every 4 (four) hours as  needed for wheezing or shortness of breath. (Patient not taking: Reported on 11/28/2014) 18 g 5  . albuterol (PROVENTIL) (2.5 MG/3ML) 0.083% nebulizer solution Take 3 mLs (2.5 mg total) by nebulization every 6 (six) hours as needed for wheezing or shortness of breath (for wheezing). (Patient not taking: Reported on 11/28/2014) 75 mL 6  . nitroGLYCERIN (NITROSTAT) 0.4 MG SL tablet Place 0.4 mg under the tongue every 5 (five) minutes as needed for chest pain.    . sodium chloride (OCEAN) 0.65 % SOLN nasal spray Place 1 spray into both nostrils as needed for congestion.    . traMADol (ULTRAM) 50 MG tablet Take 1 tablet (50 mg total) by mouth every 6 (six) hours as needed for moderate pain. (Patient not taking: Reported on 11/28/2014) 45 tablet 5   No current facility-administered medications for this visit.    Objective: BP 130/52 mmHg  Pulse 82  Temp(Src) 98.2 F (36.8 C)  Wt 178 lb (80.74 kg) Gen: NAD, resting comfortably CV: RRR no murmurs rubs or gallops Lungs: CTAB no crackles, wheeze, rhonchi Abdomen: soft/nontender/nondistended/normal bowel sounds. No rebound or guarding.  Ext: trace edema Skin: warm, dry, healed prior excision of skin cancer off R upper back Neuro: sits in wheelchair   Assessment/Plan:  Osteoarthritis, knee Steroid injections have not helped in the past x1 though oral prednisone had been at one point. Patient was considering visit to [place] for intraarticular hyaluronate injections. I offered patient referral to our sports medicine Dr. Tamala Julian and she preferred this option. Referred at this time. Patient wants cost of visit to be as low as possible, so she will discuss this with Dr. Era Bumpers avoid ultrasound if possible to lower cost.    Chronic diastolic CHF (congestive heart failure) Weight up-some from fluid, some from overeating. 1 extra dose of lasix to be given today then continue previous use with return precautions advised.    Essential  hypertension Controlled today on Enalapril 20mg  BID, Lasix 20mg , metoprolol 100mg  XL, Amlodipine 10mg . 2 month f/u.    IDDM type 2 Mild poor control on 14 units lantus, titrate to 15 units. Continue metformin. 2 month follow up and a1c at that time. F/u if fastings <100 or >200 before visit    Orders Placed This Encounter  Procedures  . Ambulatory referral to Sports Medicine    Referral Priority:  Routine    Referral Type:  Consultation    Referred to Provider:  Lyndal Pulley, DO    Number of Visits Requested:  1   Also may use tramadol up to 100mg  once a day and 50mg  other doses Meds ordered this encounter  Medications  . traMADol (ULTRAM) 50 MG tablet    Sig: Take 1-2 tablets (50-100 mg total) by mouth every 6 (six) hours as needed for moderate pain (can take 2 tabs  once a day, 1 tab other times in the day).    Dispense:  60 tablet    Refill:  5    CYCLE FILL MEDICATION. Authorization is required for next refill.

## 2014-12-02 ENCOUNTER — Other Ambulatory Visit: Payer: Self-pay | Admitting: *Deleted

## 2014-12-02 ENCOUNTER — Other Ambulatory Visit: Payer: Self-pay

## 2014-12-02 ENCOUNTER — Telehealth: Payer: Self-pay | Admitting: *Deleted

## 2014-12-02 MED ORDER — FUROSEMIDE 20 MG PO TABS: 20.0000 mg | ORAL_TABLET | Freq: Two times a day (BID) | ORAL | Status: DC

## 2014-12-02 MED ORDER — FLUTICASONE-SALMETEROL 250-50 MCG/DOSE IN AEPB: 1.0000 | INHALATION_SPRAY | Freq: Every day | RESPIRATORY_TRACT | Status: DC | PRN

## 2014-12-02 MED ORDER — FUROSEMIDE 20 MG PO TABS
20.0000 mg | ORAL_TABLET | Freq: Every day | ORAL | Status: DC
Start: 2014-12-02 — End: 2014-12-02

## 2014-12-02 NOTE — Telephone Encounter (Signed)
Rx sent in, updated per Dr. Yong Channel last [place] note

## 2014-12-02 NOTE — Telephone Encounter (Signed)
Kristopher Oppenheim faxed over request saying patient is taking her Furosemide 20 mg more than once a day.  Please advise and send in new rx

## 2014-12-02 NOTE — Addendum Note (Signed)
Addended by: Townsend Roger D on: 12/02/2014 02:16 PM   Modules accepted: Orders

## 2014-12-12 ENCOUNTER — Other Ambulatory Visit: Payer: Self-pay | Admitting: Family Medicine

## 2014-12-12 ENCOUNTER — Telehealth: Payer: Self-pay | Admitting: Cardiovascular Disease

## 2014-12-12 MED ORDER — SERTRALINE HCL 25 MG PO TABS: 25.0000 mg | ORAL_TABLET | Freq: Every day | ORAL | Status: DC

## 2014-12-12 NOTE — Telephone Encounter (Signed)
Pt c/o of Chest Pain: STAT if CP now or developed within 24 hours  1. Are you having CP right now? Yes  2. Are you experiencing any other symptoms (ex. SOB, nausea, vomiting, sweating)? No  3. How long have you been experiencing CP? 45 minutes  4. Is your CP continuous or coming and going? Continuous  5. Have you taken Nitroglycerin? Yes, 2 doses taken ?

## 2014-12-12 NOTE — Telephone Encounter (Signed)
Sent to the pharmacy by e-scribe.  Pt should return in 2 months according to last OV note on 11/28/14. Filled for 2 months.

## 2014-12-12 NOTE — Telephone Encounter (Signed)
Pt son called as mom has been experiencing CP on and off for the past 24 hours.  Spoke with the pt she has no c/o of SOB, dizziness, or nausea. Pt stated pain is not radiating anywhere. CP is sharp and mainly under her left breast.  Pt stated is started some yesterday and she does not feel well today and has been in bed all day.  Pt has taken 2  Nitro and pain is lower at a level 3 at moment from a 10 before she took 2nd pill.  Pt instructed that if she continues to not feel well and experiencing more CP needing a 3rd Nitro she needs to go to the emergency room calling 911.  Pt verbalized understanding no additional questions at this time.

## 2014-12-14 ENCOUNTER — Emergency Department (HOSPITAL_COMMUNITY): Payer: Commercial Managed Care - HMO

## 2014-12-14 ENCOUNTER — Encounter (HOSPITAL_COMMUNITY): Payer: Self-pay | Admitting: *Deleted

## 2014-12-14 ENCOUNTER — Observation Stay (HOSPITAL_COMMUNITY)
Admission: EM | Admit: 2014-12-14 | Discharge: 2014-12-15 | Disposition: A | Discharge status: Home / Self Care | Payer: Commercial Managed Care - HMO | Attending: Internal Medicine | Admitting: Internal Medicine

## 2014-12-14 DIAGNOSIS — I319 Disease of pericardium, unspecified: Secondary | ICD-10-CM | POA: Diagnosis not present

## 2014-12-14 DIAGNOSIS — I440 Atrioventricular block, first degree: Secondary | ICD-10-CM | POA: Insufficient documentation

## 2014-12-14 DIAGNOSIS — Z955 Presence of coronary angioplasty implant and graft: Secondary | ICD-10-CM | POA: Diagnosis not present

## 2014-12-14 DIAGNOSIS — E119 Type 2 diabetes mellitus without complications: Secondary | ICD-10-CM | POA: Diagnosis not present

## 2014-12-14 DIAGNOSIS — M1990 Unspecified osteoarthritis, unspecified site: Secondary | ICD-10-CM | POA: Insufficient documentation

## 2014-12-14 DIAGNOSIS — E785 Hyperlipidemia, unspecified: Secondary | ICD-10-CM | POA: Diagnosis not present

## 2014-12-14 DIAGNOSIS — E1159 Type 2 diabetes mellitus with other circulatory complications: Secondary | ICD-10-CM | POA: Diagnosis present

## 2014-12-14 DIAGNOSIS — Z79899 Other long term (current) drug therapy: Secondary | ICD-10-CM | POA: Insufficient documentation

## 2014-12-14 DIAGNOSIS — I10 Essential (primary) hypertension: Secondary | ICD-10-CM | POA: Diagnosis not present

## 2014-12-14 DIAGNOSIS — R079 Chest pain, unspecified: Principal | ICD-10-CM | POA: Insufficient documentation

## 2014-12-14 DIAGNOSIS — I25119 Atherosclerotic heart disease of native coronary artery with unspecified angina pectoris: Secondary | ICD-10-CM | POA: Insufficient documentation

## 2014-12-14 DIAGNOSIS — R0602 Shortness of breath: Secondary | ICD-10-CM | POA: Diagnosis not present

## 2014-12-14 DIAGNOSIS — Z87891 Personal history of nicotine dependence: Secondary | ICD-10-CM | POA: Insufficient documentation

## 2014-12-14 DIAGNOSIS — Z7982 Long term (current) use of aspirin: Secondary | ICD-10-CM | POA: Insufficient documentation

## 2014-12-14 DIAGNOSIS — J449 Chronic obstructive pulmonary disease, unspecified: Secondary | ICD-10-CM

## 2014-12-14 DIAGNOSIS — E1169 Type 2 diabetes mellitus with other specified complication: Secondary | ICD-10-CM | POA: Diagnosis present

## 2014-12-14 DIAGNOSIS — R0789 Other chest pain: Secondary | ICD-10-CM | POA: Diagnosis not present

## 2014-12-14 DIAGNOSIS — I2510 Atherosclerotic heart disease of native coronary artery without angina pectoris: Secondary | ICD-10-CM | POA: Diagnosis present

## 2014-12-14 DIAGNOSIS — I5032 Chronic diastolic (congestive) heart failure: Secondary | ICD-10-CM | POA: Diagnosis present

## 2014-12-14 DIAGNOSIS — Z794 Long term (current) use of insulin: Secondary | ICD-10-CM | POA: Diagnosis not present

## 2014-12-14 LAB — BASIC METABOLIC PANEL
Anion gap: 8 (ref 5–15)
BUN: 11 mg/dL (ref 6–20)
CO2: 26 mmol/L (ref 22–32)
Calcium: 9.6 mg/dL (ref 8.9–10.3)
Chloride: 103 mmol/L (ref 101–111)
Creatinine, Ser: 0.74 mg/dL (ref 0.44–1.00)
GFR calc Af Amer: >60 mL/min (ref >60)
Glucose, Bld: 151 mg/dL — ABNORMAL HIGH (ref 65–99)
POTASSIUM: 4.1 mmol/L (ref 3.5–5.1)
Sodium: 137 mmol/L (ref 135–145)

## 2014-12-14 LAB — GLUCOSE, CAPILLARY
GLUCOSE-CAPILLARY: 219 mg/dL — AB (ref 65–99)
Glucose-Capillary: 156 mg/dL — ABNORMAL HIGH (ref 65–99)
Glucose-Capillary: 261 mg/dL — ABNORMAL HIGH (ref 65–99)
Glucose-Capillary: 127 mg/dL — ABNORMAL HIGH (ref 65–99)

## 2014-12-14 LAB — CBC
HCT: 35.3 % — ABNORMAL LOW (ref 36.0–46.0)
HEMATOCRIT: 35.8 % — AB (ref 36.0–46.0)
HEMOGLOBIN: 11.3 g/dL — AB (ref 12.0–15.0)
Hemoglobin: 11.4 g/dL — ABNORMAL LOW (ref 12.0–15.0)
MCH: 23.1 pg — ABNORMAL LOW (ref 26.0–34.0)
MCH: 23.3 pg — AB (ref 26.0–34.0)
MCHC: 32 g/dL (ref 30.0–36.0)
MCHC: 31.8 g/dL (ref 30.0–36.0)
MCV: 72 fL — AB (ref 78.0–100.0)
MCV: 73.2 fL — AB (ref 78.0–100.0)
PLATELETS: 264 10*3/uL (ref 150–400)
PLATELETS: 242 10*3/uL (ref 150–400)
RBC: 4.9 MIL/uL (ref 3.87–5.11)
RBC: 4.89 MIL/uL (ref 3.87–5.11)
RDW: 17.5 % — ABNORMAL HIGH (ref 11.5–15.5)
RDW: 17.6 % — AB (ref 11.5–15.5)
WBC: 7.5 10*3/uL (ref 4.0–10.5)
WBC: 8.3 10*3/uL (ref 4.0–10.5)

## 2014-12-14 LAB — TROPONIN I: Troponin I: <0.03 ng/mL (ref <0.031)

## 2014-12-14 LAB — CREATININE, SERUM
Creatinine, Ser: 0.71 mg/dL (ref 0.44–1.00)
GFR calc Af Amer: >60 mL/min (ref >60)
GFR calc non Af Amer: >60 mL/min (ref >60)

## 2014-12-14 LAB — FERRITIN: Ferritin: 9 ng/mL — ABNORMAL LOW (ref 11–307)

## 2014-12-14 LAB — I-STAT TROPONIN, ED: TROPONIN I, POC: 0 ng/mL (ref 0.00–0.08)

## 2014-12-14 LAB — BRAIN NATRIURETIC PEPTIDE: B Natriuretic Peptide: 52.5 pg/mL (ref 0.0–100.0)

## 2014-12-14 IMAGING — CR DG CHEST 1V PORT
1 series · 1 of 1 positions shown · non-contrast
Comparison: [DATE]

CLINICAL DATA: Left-sided chest pain starting 2 days ago worse this
morning with headache and upper back pain. Three previous cardiac
stents.

EXAM:
PORTABLE CHEST - 1 VIEW

[ap]
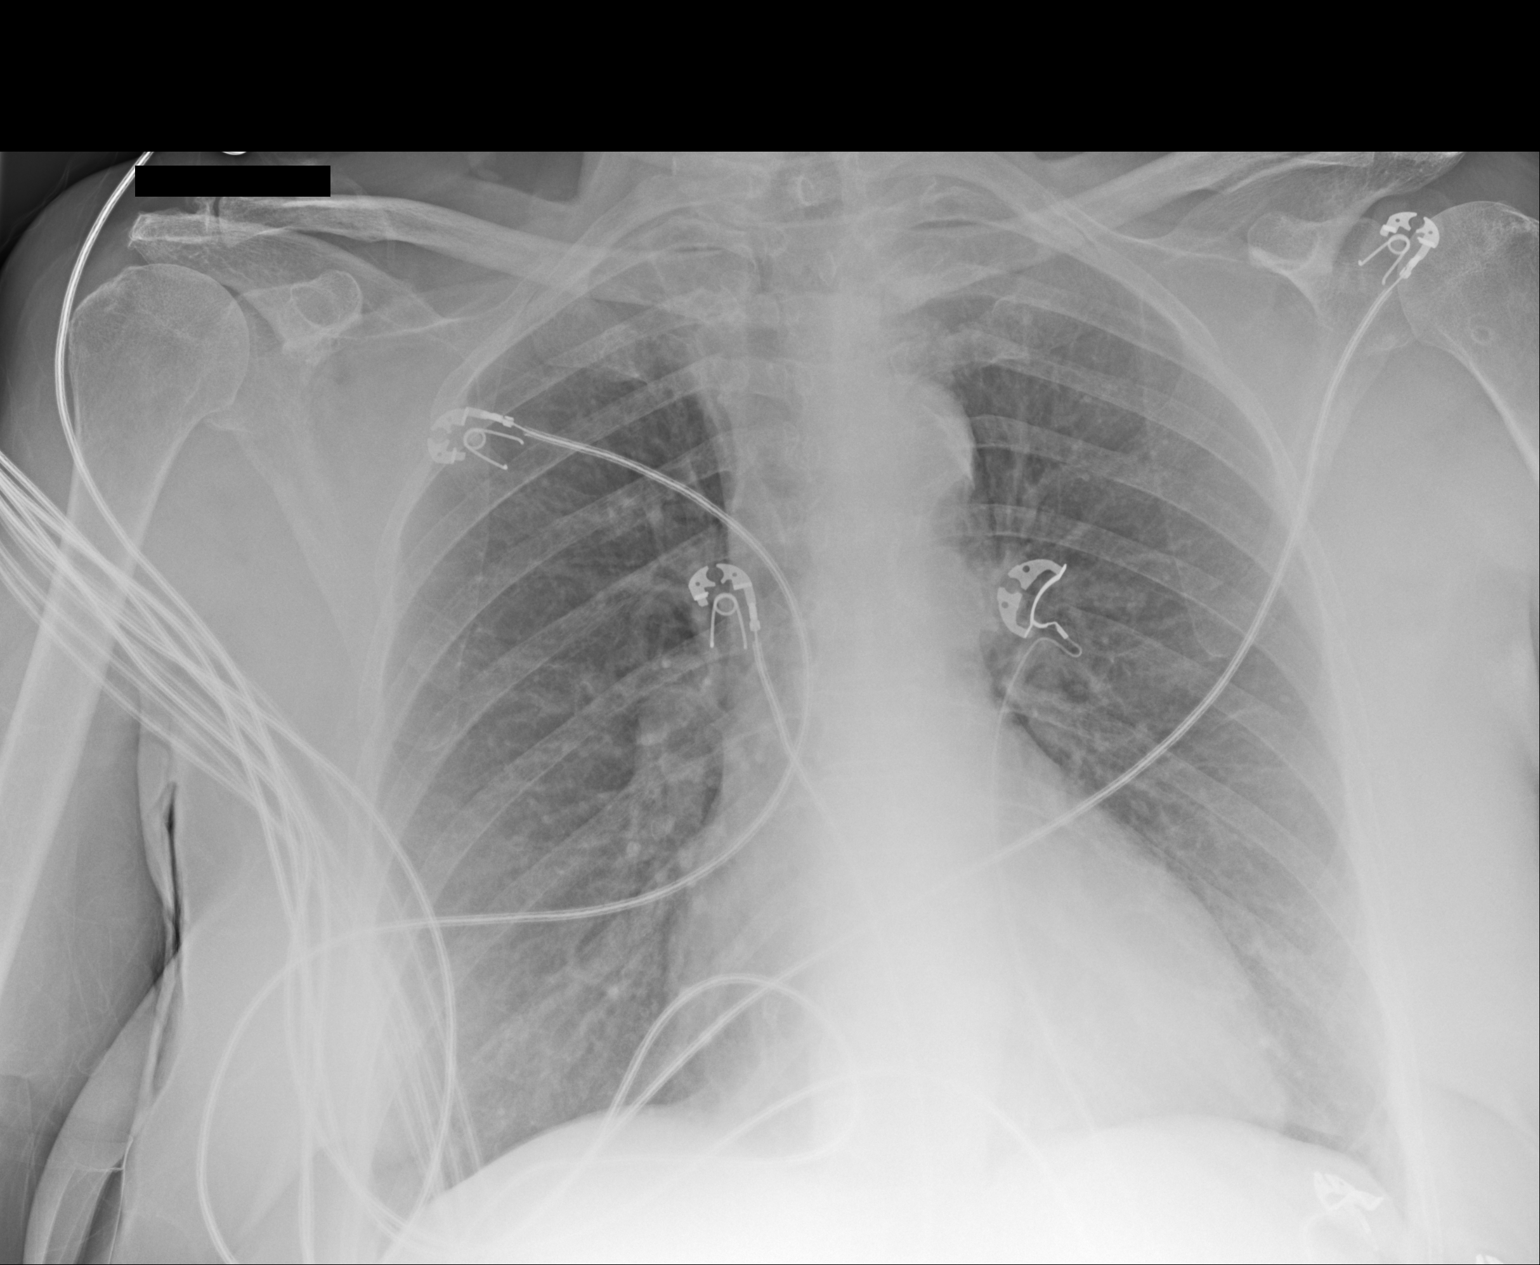

[1 of 1 positions shown; findings below may reference images not displayed]

FINDINGS: Lungs are well inflated without consolidation or effusion. Cardiac
silhouette is within normal. There is calcified plaque over the
aortic arch. Remainder of the exam is unchanged.
IMPRESSION: No active disease.

## 2014-12-14 MED ORDER — METOPROLOL SUCCINATE ER 50 MG PO TB24
50.0000 mg | ORAL_TABLET | Freq: Every day | ORAL | Status: DC
  Administered 2014-12-15: 50 mg via ORAL
  Filled 2014-12-14: qty 1

## 2014-12-14 MED ORDER — FOLIC ACID 400 MCG PO TABS: 400.0000 ug | ORAL_TABLET | Freq: Every day | ORAL | Status: DC

## 2014-12-14 MED ORDER — ROSUVASTATIN CALCIUM 10 MG PO TABS
40.0000 mg | ORAL_TABLET | Freq: Every day | ORAL | Status: DC
  Administered 2014-12-14 – 2014-12-15 (×2): 40 mg via ORAL
  Filled 2014-12-14 (×2): qty 4

## 2014-12-14 MED ORDER — FENOFIBRATE 160 MG PO TABS
160.0000 mg | ORAL_TABLET | Freq: Every day | ORAL | Status: DC
  Administered 2014-12-14 – 2014-12-15 (×2): 160 mg via ORAL
  Filled 2014-12-14 (×2): qty 1

## 2014-12-14 MED ORDER — FOLIC ACID 1 MG PO TABS
0.5000 mg | ORAL_TABLET | Freq: Every day | ORAL | Status: DC
  Administered 2014-12-14 – 2014-12-15 (×2): 0.5 mg via ORAL
  Filled 2014-12-14 (×2): qty 1

## 2014-12-14 MED ORDER — AMLODIPINE BESYLATE 10 MG PO TABS
10.0000 mg | ORAL_TABLET | Freq: Every day | ORAL | Status: DC
  Administered 2014-12-14 – 2014-12-15 (×2): 10 mg via ORAL
  Filled 2014-12-14 (×3): qty 1

## 2014-12-14 MED ORDER — HEPARIN SODIUM (PORCINE) 5000 UNIT/ML IJ SOLN
5000.0000 [IU] | Freq: Three times a day (TID) | INTRAMUSCULAR | Status: DC
  Administered 2014-12-14 (×2): 5000 [IU] via SUBCUTANEOUS
  Filled 2014-12-14 (×2): qty 1

## 2014-12-14 MED ORDER — VITAMIN E 180 MG (400 UNIT) PO CAPS
400.0000 [IU] | ORAL_CAPSULE | Freq: Every day | ORAL | Status: DC
  Administered 2014-12-14 – 2014-12-15 (×2): 400 [IU] via ORAL
  Filled 2014-12-14 (×2): qty 1

## 2014-12-14 MED ORDER — PANTOPRAZOLE SODIUM 40 MG PO TBEC
40.0000 mg | DELAYED_RELEASE_TABLET | Freq: Every day | ORAL | Status: DC
  Administered 2014-12-14 – 2014-12-15 (×2): 40 mg via ORAL
  Filled 2014-12-14 (×2): qty 1

## 2014-12-14 MED ORDER — ASPIRIN 81 MG PO CHEW
81.0000 mg | CHEWABLE_TABLET | Freq: Every day | ORAL | Status: DC
  Administered 2014-12-15: 81 mg via ORAL
  Filled 2014-12-14 (×2): qty 1

## 2014-12-14 MED ORDER — INSULIN ASPART 100 UNIT/ML ~~LOC~~ SOLN
0.0000 [IU] | Freq: Three times a day (TID) | SUBCUTANEOUS | Status: DC
  Administered 2014-12-14: 1 [IU] via SUBCUTANEOUS
  Administered 2014-12-14: 5 [IU] via SUBCUTANEOUS
  Administered 2014-12-15: 2 [IU] via SUBCUTANEOUS

## 2014-12-14 MED ORDER — ACETAMINOPHEN 325 MG PO TABS
650.0000 mg | ORAL_TABLET | ORAL | Status: DC | PRN
Start: 2014-12-14 — End: 2014-12-15
  Administered 2014-12-14: 650 mg via ORAL
  Filled 2014-12-14: qty 2

## 2014-12-14 MED ORDER — OMEGA-3-ACID ETHYL ESTERS 1 G PO CAPS
2.0000 g | ORAL_CAPSULE | Freq: Every day | ORAL | Status: DC
  Administered 2014-12-14 – 2014-12-15 (×2): 2 g via ORAL
  Filled 2014-12-14 (×3): qty 2

## 2014-12-14 MED ORDER — ALBUTEROL SULFATE (2.5 MG/3ML) 0.083% IN NEBU
2.5000 mg | INHALATION_SOLUTION | Freq: Four times a day (QID) | RESPIRATORY_TRACT | Status: DC | PRN
  Administered 2014-12-15: 2.5 mg via RESPIRATORY_TRACT
  Filled 2014-12-14: qty 3

## 2014-12-14 MED ORDER — SERTRALINE HCL 50 MG PO TABS
25.0000 mg | ORAL_TABLET | Freq: Every day | ORAL | Status: DC
  Administered 2014-12-14 – 2014-12-15 (×2): 25 mg via ORAL
  Filled 2014-12-14 (×2): qty 1

## 2014-12-14 MED ORDER — MOMETASONE FURO-FORMOTEROL FUM 100-5 MCG/ACT IN AERO
2.0000 | INHALATION_SPRAY | Freq: Two times a day (BID) | RESPIRATORY_TRACT | Status: DC
  Administered 2014-12-14 – 2014-12-15 (×2): 2 via RESPIRATORY_TRACT
  Filled 2014-12-14 (×2): qty 8.8

## 2014-12-14 MED ORDER — INSULIN GLARGINE 100 UNIT/ML ~~LOC~~ SOLN
10.0000 [IU] | Freq: Every day | SUBCUTANEOUS | Status: DC
  Administered 2014-12-14 – 2014-12-15 (×2): 10 [IU] via SUBCUTANEOUS
  Filled 2014-12-14 (×2): qty 0.1

## 2014-12-14 MED ORDER — DIPHENHYDRAMINE HCL 25 MG PO CAPS
25.0000 mg | ORAL_CAPSULE | Freq: Once | ORAL | Status: AC
  Administered 2014-12-14: 25 mg via ORAL
  Filled 2014-12-14: qty 1

## 2014-12-14 MED ORDER — METOPROLOL SUCCINATE ER 100 MG PO TB24
100.0000 mg | ORAL_TABLET | Freq: Every day | ORAL | Status: DC
  Administered 2014-12-14: 100 mg via ORAL
  Filled 2014-12-14: qty 1

## 2014-12-14 MED ORDER — NITROGLYCERIN 0.4 MG SL SUBL: 0.4000 mg | SUBLINGUAL_TABLET | SUBLINGUAL | Status: DC | PRN

## 2014-12-14 MED ORDER — SALINE SPRAY 0.65 % NA SOLN
1.0000 | NASAL | Status: DC | PRN
  Filled 2014-12-14: qty 44

## 2014-12-14 MED ORDER — INSULIN GLARGINE 100 UNIT/ML SOLOSTAR PEN: 10.0000 [IU] | PEN_INJECTOR | SUBCUTANEOUS | Status: DC

## 2014-12-14 MED ORDER — ONDANSETRON HCL 4 MG/2ML IJ SOLN: 4.0000 mg | Freq: Four times a day (QID) | INTRAMUSCULAR | Status: DC | PRN

## 2014-12-14 MED ORDER — TIOTROPIUM BROMIDE MONOHYDRATE 18 MCG IN CAPS
18.0000 ug | ORAL_CAPSULE | Freq: Every day | RESPIRATORY_TRACT | Status: DC
  Administered 2014-12-14 – 2014-12-15 (×2): 18 ug via RESPIRATORY_TRACT
  Filled 2014-12-14: qty 5

## 2014-12-14 MED ORDER — NITROGLYCERIN 2 % TD OINT
1.0000 [in_us] | TOPICAL_OINTMENT | Freq: Once | TRANSDERMAL | Status: AC
  Administered 2014-12-14: 1 [in_us] via TOPICAL
  Filled 2014-12-14: qty 1

## 2014-12-14 MED ORDER — CARVEDILOL 12.5 MG PO TABS
12.5000 mg | ORAL_TABLET | Freq: Two times a day (BID) | ORAL | Status: DC
  Administered 2014-12-14 – 2014-12-15 (×2): 12.5 mg via ORAL
  Filled 2014-12-14 (×2): qty 1

## 2014-12-14 MED ORDER — ENALAPRIL MALEATE 5 MG PO TABS
20.0000 mg | ORAL_TABLET | Freq: Two times a day (BID) | ORAL | Status: DC
  Administered 2014-12-14 – 2014-12-15 (×3): 20 mg via ORAL
  Filled 2014-12-14 (×2): qty 4
  Filled 2014-12-14: qty 8

## 2014-12-14 MED ORDER — POTASSIUM CHLORIDE CRYS ER 10 MEQ PO TBCR
10.0000 meq | EXTENDED_RELEASE_TABLET | Freq: Every day | ORAL | Status: DC
  Administered 2014-12-14 – 2014-12-15 (×2): 10 meq via ORAL
  Filled 2014-12-14 (×2): qty 1

## 2014-12-14 MED ORDER — PRASUGREL HCL 10 MG PO TABS
10.0000 mg | ORAL_TABLET | Freq: Every day | ORAL | Status: DC
  Administered 2014-12-14 – 2014-12-15 (×2): 10 mg via ORAL
  Filled 2014-12-14 (×2): qty 1

## 2014-12-14 MED ORDER — ASPIRIN 325 MG PO TABS
325.0000 mg | ORAL_TABLET | ORAL | Status: AC
  Administered 2014-12-14: 325 mg via ORAL
  Filled 2014-12-14: qty 1

## 2014-12-14 NOTE — H&P (Signed)
Patient Demographics  Michelle [name], is a 81 y.o. female  MRN: [id number]   DOB - [birth year]  Admit Date - 12/14/2014  Outpatient Primary MD for the patient is [name], MD   With History of -  Past Medical History  Diagnosis Date  . COPD (chronic obstructive pulmonary disease)   . CAD (coronary artery disease) 1990; 2015    Cardiac cath 1990 with Dr. Lia Foyer and [name] Residual CAD, non obstructive per 2015 cath. STEMI status post stent in August 2015.  Marland Kitchen Hyperlipidemia with target LDL less than 70   . Essential hypertension   . Diabetes mellitus type 2, insulin dependent   . AV block, 1st degree   . 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 Myocardial infarction   . Anginal pain   . Shortness of breath   . Arthritis   . Pericarditis-post MI (short course of steroids) 03/06/2014      Past Surgical History  Procedure Laterality Date  . Cholecystectomy    . Cataract extraction  2010  . Ptca  1990    PTCA of RCA  . Transthoracic echocardiogram  02/02/2012    mild LVH, EF 55-60%, Normal WM, Gr 1 DD; Mild MR  . Coronary angioplasty with stent placement  02/18/14    Promus DES to RCA  . Left heart catheterization with coronary angiogram N/A 02/18/2014    Procedure: LEFT HEART CATHETERIZATION WITH CORONARY ANGIOGRAM;  Surgeon: Jettie Booze, MD;  Location: [facility] CATH LAB;  Service: Cardiovascular;  Laterality: N/A;    in for   Chief Complaint  Patient presents with  . Chest Pain     HPI  Michelle [name]  is a 81 y.o. female, coronary artery disease status post STEMI August 2015 status post stenting, history of pericardial effusion, diastolic CHF, COPD, IDDM, hyperlipidemia, presents with complaints of chest pain, reports chest pain  started last Friday, intermittent, no probing factor, particularly if not by sublingual nitroglycerin at home, much worsened this a.m. which prompted her to come to the ED, NAD pain was relieved by nitroglycerin paste, no acute EKG changes, negative troponins 1, reports mild dyspnea, denies any palpitation, diaphoresis, nausea, received full dose aspirin in ED, reports she has been compliant with her medication including Effient, reports this chest pain is different from the chest pain she had when she had her MI in August/2015, is one is more sharp, left-sided, the one she had in August was midsternal, throbbing quality, hospitalist requested to admit the patient .    Review of Systems    In addition to the HPI above, No Fever-chills, No Headache, No changes with Vision or hearing, No problems swallowing food or Liquids, Reports Chest pain, and mild Shortness of Breath, No Abdominal pain, No Nausea or Vommitting, Bowel movements are regular, No Blood in stool or Urine, No dysuria, No new skin rashes or bruises, No new joints pains-aches,  No new weakness, tingling, numbness in any extremity, No recent weight gain or loss, No polyuria, polydypsia or polyphagia, No significant Mental Stressors.  A full 10 point Review of Systems was done, except as stated above, all other Review of Systems were negative.   Social History History  Substance Use Topics  .  Smoking status: Former Smoker -- 1.00 packs/day for 55 years    Types: Cigarettes    Quit date: 09/29/2006  . Smokeless tobacco: Never Used  . Alcohol Use: No     Family History Family History  Problem Relation Age of Onset  . Heart attack Mother 26  . Cancer Father 83  . Heart attack Son     Prior to Admission medications   Medication Sig Start Date End Date Taking? Authorizing Provider  acetaminophen (TYLENOL) 325 MG tablet Take 650 mg by mouth every 6 (six) hours as needed for mild pain.   Yes Historical Provider, MD    albuterol (PROAIR HFA) 108 (90 BASE) MCG/ACT inhaler Inhale 1 puff into the lungs every 4 (four) hours as needed for wheezing or shortness of breath. 06/18/14  Yes [name], MD  albuterol (PROVENTIL) (2.5 MG/3ML) 0.083% nebulizer solution Take 3 mLs (2.5 mg total) by nebulization every 6 (six) hours as needed for wheezing or shortness of breath (for wheezing). 11/05/14  Yes [name], MD  amLODipine (NORVASC) 10 MG tablet Take 1 tablet (10 mg total) by mouth daily. 09/26/14  Yes [name], MD  aspirin 81 MG tablet Take 81 mg by mouth daily.  07/02/14  Yes Historical Provider, MD  EFFIENT 10 MG TABS tablet TAKE 1 TABLET (10 MG TOTAL) BY MOUTH DAILY. 11/21/14  Yes Burnell Blanks, MD  enalapril (VASOTEC) 20 MG tablet Take 1 tablet (20 mg total) by mouth 2 (two) times daily. 08/22/14  Yes [name], MD  fenofibrate micronized (LOFIBRA) 134 MG capsule Take 134 mg by mouth daily before breakfast.   Yes Historical Provider, MD  fish oil-omega-3 fatty acids 1000 MG capsule Take 2 g by mouth daily.     Yes Historical Provider, MD  Fluticasone-Salmeterol (ADVAIR DISKUS) 250-50 MCG/DOSE AEPB Inhale 1 puff into the lungs daily as needed. Patient taking differently: Inhale 1 puff into the lungs every morning.  12/02/14  Yes [name], MD  folic acid (FOLVITE) 902 MCG tablet Take 400 mcg by mouth daily.     Yes Historical Provider, MD  furosemide (LASIX) 20 MG tablet Take 1 tablet (20 mg total) by mouth 2 (two) times daily. 12/02/14  Yes [name], MD  Insulin Glargine (LANTUS SOLOSTAR) 100 UNIT/ML Solostar Pen INJECT 10-15 UNITS INTO THE SKIN Daily Patient taking differently: Inject 15 Units into the skin every morning.  06/20/14  Yes [name], MD  metFORMIN (GLUCOPHAGE) 1000 MG tablet TAKE 1 TABLET BY MOUTH TWO TIMES A DAY 08/20/14  Yes [name], MD  metoprolol succinate (TOPROL-XL) 100 MG 24 hr tablet Take 100 mg by mouth daily. Take with or immediately  following a meal.   Yes Historical Provider, MD  nitroGLYCERIN (NITROSTAT) 0.4 MG SL tablet Place 0.4 mg under the tongue every 5 (five) minutes as needed for chest pain.   Yes Historical Provider, MD  pantoprazole (PROTONIX) 40 MG tablet Take 1 tablet (40 mg total) by mouth daily. 11/27/14  Yes Isaiah Serge, NP  potassium chloride SA (K-DUR,KLOR-CON) 20 MEQ tablet Take 0.5 tablets (10 mEq total) by mouth daily. 03/21/14  Yes Scott Joylene Draft, PA-C  rosuvastatin (CRESTOR) 40 MG tablet Take 40 mg by mouth daily.   Yes Historical Provider, MD  sertraline (ZOLOFT) 25 MG tablet Take 1 tablet (25 mg total) by mouth daily. 12/12/14  Yes [name], MD  sodium chloride (OCEAN) 0.65 % SOLN nasal spray  Place 1 spray into both nostrils as needed for congestion.   Yes Historical Provider, MD  tiotropium (SPIRIVA) 18 MCG inhalation capsule Place 18 mcg into inhaler and inhale daily.   Yes Historical Provider, MD  traMADol (ULTRAM) 50 MG tablet Take 1-2 tablets (50-100 mg total) by mouth every 6 (six) hours as needed for moderate pain (can take 2 tabs once a day, 1 tab other times in the day). 11/28/14  Yes [name], MD  vitamin E 400 UNIT capsule Take 400 Units by mouth daily.     Yes Historical Provider, MD  B-D ULTRAFINE III SHORT PEN 31G X 8 MM MISC USE WITH LANTUS SOLOSTAR 09/08/14   [name], MD    Allergies  Allergen Reactions  . Codeine Sulfate Nausea Only  . Morphine Sulfate Nausea Only    Physical Exam  Vitals  Blood pressure 162/59, pulse 77, temperature 98 F (36.7 C), resp. rate 20, height 5\' 5"  (1.651 m), weight 78.926 kg (174 lb), SpO2 99 %.   1. General well-nourished female lying in bed in NAD,    2. Normal affect and insight, Not Suicidal or Homicidal, Awake Alert, Oriented X 3.  3. No F.N deficits, ALL C.Nerves Intact, Strength 5/5 all 4 extremities, Sensation intact all 4 extremities, Plantars down going.  4. Ears and Eyes appear Normal, Conjunctivae clear,  PERRLA. Moist Oral Mucosa.  5. Supple Neck, No JVD, No cervical lymphadenopathy appriciated, No Carotid Bruits.  6. Symmetrical Chest wall movement, Good air movement bilaterally, CTAB.   7. RRR, No Gallops, Rubs or Murmurs, No Parasternal Heave.  8. Positive Bowel Sounds, Abdomen Soft, No tenderness, No organomegaly appriciated,No rebound -guarding or rigidity.  9.  No Cyanosis, Normal Skin Turgor, No Skin Rash or Bruise.  10. Good muscle tone,  joints appear normal , no effusions, Normal ROM.  11. No Palpable Lymph Nodes in Neck or Axillae   Data Review  CBC  Recent Labs Lab 12/14/14 0700  WBC 7.5  HGB 11.3*  HCT 35.3*  PLT 264  MCV 72.0*  MCH 23.1*  MCHC 32.0  RDW 17.5*   ------------------------------------------------------------------------------------------------------------------  Chemistries   Recent Labs Lab 12/14/14 0700  NA 137  K 4.1  CL 103  CO2 26  GLUCOSE 151*  BUN 11  CREATININE 0.74  CALCIUM 9.6   ------------------------------------------------------------------------------------------------------------------ estimated creatinine clearance is 59.2 mL/min (by C-G formula based on Cr of 0.74). ------------------------------------------------------------------------------------------------------------------ No results for input(s): TSH, T4TOTAL, T3FREE, THYROIDAB in the last 72 hours.  Invalid input(s): FREET3   Coagulation profile No results for input(s): INR, PROTIME in the last 168 hours. ------------------------------------------------------------------------------------------------------------------- No results for input(s): DDIMER in the last 72 hours. -------------------------------------------------------------------------------------------------------------------  Cardiac Enzymes No results for input(s): CKMB, TROPONINI, MYOGLOBIN in the last 168 hours.  Invalid input(s):  CK ------------------------------------------------------------------------------------------------------------------ Invalid input(s): POCBNP   ---------------------------------------------------------------------------------------------------------------  Urinalysis No results found for: COLORURINE, APPEARANCEUR, LABSPEC, [place], GLUCOSEU, HGBUR, BILIRUBINUR, KETONESUR, PROTEINUR, UROBILINOGEN, NITRITE, LEUKOCYTESUR  ----------------------------------------------------------------------------------------------------------------  Imaging results:   Dg Chest Port 1 View  12/14/2014   CLINICAL DATA:  Left-sided chest pain starting 2 days ago worse this morning with headache and upper back pain. Three previous cardiac stents.  EXAM: PORTABLE CHEST - 1 VIEW  COMPARISON:  03/13/2014  FINDINGS: Lungs are well inflated without consolidation or effusion. Cardiac silhouette is within normal. There is calcified plaque over the aortic arch. Remainder of the exam is unchanged.  IMPRESSION: No active disease.   Electronically Signed   By: Quillian Quince  Derrel Nip M.D.   On: 12/14/2014 07:58    My personal review of EKG: Rhythm NSR, Rate  76 /min, QTc 434, no Acute ST changes    Assessment & Plan  Principal Problem:   Chest pain Active Problems:   IDDM type 2   Hyperlipidemia with target LDL less than 70   Essential hypertension   COPD (chronic obstructive pulmonary disease)   CAD- RCA PCI '90s, STEMI-RCA DES 02/18/14   Chronic diastolic CHF (congestive heart failure)    Chest pain - Patient with multiple risk factors including hyperlipidemia, hypertension, diabetes, history of coronary artery disease with recent stenting, has some non-typical feature including sharp quality of her chest pain, that it was relieved by nitroglycerin paste, so she will be admitted for further workup. - Will admit to telemetry, continue to cycle cardiac enzymes and follow the trend, repeat EKG in a.m., will consult  cardiology to see if any further workup is indicated at this point.  - Received full dose aspirin NAD, continue with aspirin, Effient, sublingual nitroglycerin as needed, currently chest pain-free.  Coronary artery disease - Continue with aspirin, Effient, statin, beta blockers, ACE inhibitor.  Diabetes mellitus - Continue with Lantus, start on insulin sliding scale, check hemoglobin A1c.  Hyperlipidemia - Continue with statin and fenofibrate.  COPD - No active wheezing, resume home medication.  Hypertension - Blood pressure acceptable, resume home medication.  Chronic diastolic CHF - Appears to be compensated, patient is euvolemic, will resume Lasix when patient is more stable.     DVT Prophylaxis Heparin -  AM Labs Ordered, also please review Full Orders  Family Communication: Admission, patients condition and plan of care including tests being ordered have been discussed with the patient and family who indicate understanding and agree with the plan and Code Status.  Code Status full  Likely DC to  home when stable  Condition GUARDED    Time spent in minutes : 55 minutes    ELGERGAWY, DAWOOD M.D on 12/14/2014 at 8:40 AM  Between 7am to 7pm - Pager - [phone]  After 7pm go to [email or web address] - password [email or web address]  And look for the night coverage person covering me after hours  [facility] Office  [phone]

## 2014-12-14 NOTE — Consult Note (Addendum)
CARDIOLOGY CONSULT NOTE  Patient ID: SEVILLE BRICK, MRN: [id number], DOB/AGE: 79-Oct-1934 79 y.o. Admit date: 12/14/2014 Date of Consult: 12/14/2014  Primary Physician: [name], MD Primary Cardiologist: [facility]  Chief Complaint: chest pain   HPI Michelle [name] is a 81 y.o. female  Admitted with chest pain; she was noted to be hypertensive. The pain had been going on for about 36 hours waxing and waning but typically present for greater than one hour at a time. It does not seem to be aggravated by motion breathing eating. It is much improved at this time. She denies trauma.  She is mostly bed bound. She doesn't feel like getting up. She is not sure she is depressed. She walks with a walker when she moves about. She has some problems with peripheral edema. She also has complaints of periodic dyspnea; she uses a pulse ox at home and notes that her oxygen saturations are 97-98 range. She has not looked nor seen in association of edema for which she takes when necessary added diuretics and her episodes of dyspnea.   She has known history of coronary artery disease with remote PCI in the 1990s. She has diabetes hypertension and hyperlipidemia. She also has COPD.   She had presented 8/15 with an acute inferior wall STEMI.  She had an occluded RCA which was opened at which point an aneurysm was appreciated and further stenting was undertaken. There apparently was a "small pericardial effusion" was down to trivial by the next day. She has subsequent hospitalizations for shortness of breath treated with diuresis. No hospitalizations however here noted since August 2015.  Echocardiogram 8/15 demonstrated normal LV function with mild-moderate AI and MR  Troponins negative.  She is also noted to be anemic and microcytic. The latter is a new finding compared to August.  Past Medical History  Diagnosis Date  . COPD (chronic obstructive pulmonary disease)   . CAD (coronary artery disease) 1990;  2015    Cardiac cath 1990 with Dr. Lia Foyer and [name] Residual CAD, non obstructive per 2015 cath. STEMI status post stent in August 2015.  Marland Kitchen Hyperlipidemia with target LDL less than 70   . Essential hypertension   . Diabetes mellitus type 2, insulin dependent   . AV block, 1st degree   . 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 Myocardial infarction   . Anginal pain   . Shortness of breath   . Arthritis   . Pericarditis-post MI (short course of steroids) 03/06/2014      Surgical History:  Past Surgical History  Procedure Laterality Date  . Cholecystectomy    . Cataract extraction  2010  . Ptca  1990    PTCA of RCA  . Transthoracic echocardiogram  02/02/2012    mild LVH, EF 55-60%, Normal WM, Gr 1 DD; Mild MR  . Coronary angioplasty with stent placement  02/18/14    Promus DES to RCA  . Left heart catheterization with coronary angiogram N/A 02/18/2014    Procedure: LEFT HEART CATHETERIZATION WITH CORONARY ANGIOGRAM;  Surgeon: Jettie Booze, MD;  Location: [facility] CATH LAB;  Service: Cardiovascular;  Laterality: N/A;     Home Meds: Prior to Admission medications   Medication Sig Start Date End  Date Taking? Authorizing Provider  acetaminophen (TYLENOL) 325 MG tablet Take 650 mg by mouth every 6 (six) hours as needed for mild pain.   Yes Historical Provider, MD  albuterol (PROAIR HFA) 108 (90 BASE) MCG/ACT inhaler Inhale 1 puff into the lungs every 4 (four) hours as needed for wheezing or shortness of breath. 06/18/14  Yes [name], MD  albuterol (PROVENTIL) (2.5 MG/3ML) 0.083% nebulizer solution Take 3 mLs (2.5 mg total) by nebulization every 6 (six) hours as needed for wheezing or shortness of breath (for wheezing). 11/05/14  Yes [name], MD    amLODipine (NORVASC) 10 MG tablet Take 1 tablet (10 mg total) by mouth daily. 09/26/14  Yes [name], MD  aspirin 81 MG tablet Take 81 mg by mouth daily.  07/02/14  Yes Historical Provider, MD  EFFIENT 10 MG TABS tablet TAKE 1 TABLET (10 MG TOTAL) BY MOUTH DAILY. 11/21/14  Yes Burnell Blanks, MD  enalapril (VASOTEC) 20 MG tablet Take 1 tablet (20 mg total) by mouth 2 (two) times daily. 08/22/14  Yes [name], MD  fenofibrate micronized (LOFIBRA) 134 MG capsule Take 134 mg by mouth daily before breakfast.   Yes Historical Provider, MD  fish oil-omega-3 fatty acids 1000 MG capsule Take 2 g by mouth daily.     Yes Historical Provider, MD  Fluticasone-Salmeterol (ADVAIR DISKUS) 250-50 MCG/DOSE AEPB Inhale 1 puff into the lungs daily as needed. Patient taking differently: Inhale 1 puff into the lungs every morning.  12/02/14  Yes [name], MD  folic acid (FOLVITE) 517 MCG tablet Take 400 mcg by mouth daily.     Yes Historical Provider, MD  furosemide (LASIX) 20 MG tablet Take 1 tablet (20 mg total) by mouth 2 (two) times daily. 12/02/14  Yes [name], MD  Insulin Glargine (LANTUS SOLOSTAR) 100 UNIT/ML Solostar Pen INJECT 10-15 UNITS INTO THE SKIN Daily Patient taking differently: Inject 15 Units into the skin every morning.  06/20/14  Yes [name], MD  metFORMIN (GLUCOPHAGE) 1000 MG tablet TAKE 1 TABLET BY MOUTH TWO TIMES A DAY 08/20/14  Yes [name], MD  metoprolol succinate (TOPROL-XL) 100 MG 24 hr tablet Take 100 mg by mouth daily. Take with or immediately following a meal.   Yes Historical Provider, MD  nitroGLYCERIN (NITROSTAT) 0.4 MG SL tablet Place 0.4 mg under the tongue every 5 (five) minutes as needed for chest pain.   Yes Historical Provider, MD  pantoprazole (PROTONIX) 40 MG tablet Take 1 tablet (40 mg total) by mouth daily. 11/27/14  Yes Isaiah Serge, NP  potassium chloride SA (K-DUR,KLOR-CON) 20 MEQ tablet Take 0.5 tablets (10 mEq total) by  mouth daily. 03/21/14  Yes Scott Joylene Draft, PA-C  rosuvastatin (CRESTOR) 40 MG tablet Take 40 mg by mouth daily.   Yes Historical Provider, MD  sertraline (ZOLOFT) 25 MG tablet Take 1 tablet (25 mg total) by mouth daily. 12/12/14  Yes [name], MD  sodium chloride (OCEAN) 0.65 % SOLN nasal spray Place 1 spray into both nostrils as needed for congestion.   Yes Historical Provider, MD  tiotropium (SPIRIVA) 18 MCG inhalation capsule Place 18 mcg into inhaler and inhale daily.   Yes Historical Provider, MD  traMADol (ULTRAM) 50 MG tablet Take 1-2 tablets (50-100 mg total) by mouth every 6 (six) hours as needed for moderate pain (can take 2 tabs once a day, 1 tab other times in the day). 11/28/14  Yes  [name], MD  vitamin E 400 UNIT capsule Take 400 Units by mouth daily.     Yes Historical Provider, MD  B-D ULTRAFINE III SHORT PEN 31G X 8 MM MISC USE WITH LANTUS SOLOSTAR 09/08/14   [name], MD    Inpatient Medications:  . amLODipine  10 mg Oral Daily  . [START ON 12/15/2014] aspirin  81 mg Oral Daily  . enalapril  20 mg Oral BID  . fenofibrate  160 mg Oral Daily  . folic acid  0.5 mg Oral Daily  . heparin  5,000 Units Subcutaneous 3 times per day  . insulin aspart  0-9 Units Subcutaneous TID WC  . insulin glargine  10 Units Subcutaneous Daily  . metoprolol succinate  100 mg Oral Daily  . mometasone-formoterol  2 puff Inhalation BID  . omega-3 acid ethyl esters  2 g Oral Daily  . pantoprazole  40 mg Oral Daily  . potassium chloride SA  10 mEq Oral Daily  . prasugrel  10 mg Oral Daily  . rosuvastatin  40 mg Oral Daily  . sertraline  25 mg Oral Daily  . tiotropium  18 mcg Inhalation Daily  . vitamin E  400 Units Oral Daily     Allergies:  Allergies  Allergen Reactions  . Codeine Sulfate Nausea Only  . Morphine Sulfate Nausea Only    History   Social History  . Marital Status: Married    Spouse Name: N/A  . Number of Children: 2  . Years of Education: N/A    Occupational History  . Retired    Social History Main Topics  . Smoking status: Former Smoker -- 1.00 packs/day for 55 years    Types: Cigarettes    Quit date: 09/29/2006  . Smokeless tobacco: Never Used  . Alcohol Use: No  . Drug Use: No  . Sexual Activity: Not on file   Other Topics Concern  . Not on file   Social History Narrative   Lives with her son but he works all day. Home for dinner.  Independent ADL. Needs assist on some IADL.      Family History  Problem Relation Age of Onset  . Heart attack Mother 31  . Cancer Father 20  . Heart attack Son      ROS:  Please see the history of present illness.     All other systems reviewed and negative.    Physical Exam   Blood pressure 163/65, pulse 72, temperature 98 F (36.7 C), temperature source Oral, resp. rate 17, height 5\' 5"  (1.651 m), weight 78.926 kg (174 lb), SpO2 96 %. General: Well developed, well nourished female in no acute distress. Head: Normocephalic, atraumatic, sclera non-icteric, no xanthomas, nares are without discharge. EENT: normal Lymph Nodes:  none Back/Chest: without scoliosis/kyphosis, no CVA tendersness point tenderness on her left lateral rib cage reproducing her complaint Neck: Negative for carotid bruits. JVD 7-8 Lungs: Clear bilaterally to auscultation without wheezes, rales, or rhonchi. Breathing is unlabored. Heart: RRR with S1 S2.  2/6 systolic murmur , rubs, or gallops appreciated. Abdomen: Soft, non-tender, non-distended with normoactive bowel sounds. No hepatomegaly. No rebound/guarding. No obvious abdominal masses. Msk:  Strength and tone appear normal for age. Extremities: No clubbing or cyanosis. No  edema.  Distal pedal pulses are 2+ and equal bilaterally. Skin: Warm and Dry Neuro: Alert and oriented X 3. CN III-XII intact Grossly normal sensory and motor function . Psych:  Responds to questions appropriately with a  normal affect.      Labs: Cardiac Enzymes  Recent Labs   12/14/14 1045  TROPONINI <0.03   CBC Lab Results  Component Value Date   WBC 8.3 12/14/2014   HGB 11.4* 12/14/2014   HCT 35.8* 12/14/2014   MCV 73.2* 12/14/2014   PLT 242 12/14/2014   PROTIME: No results for input(s): LABPROT, INR in the last 72 hours. Chemistry  Recent Labs Lab 12/14/14 0700 12/14/14 1045  NA 137  --   K 4.1  --   CL 103  --   CO2 26  --   BUN 11  --   CREATININE 0.74 0.71  CALCIUM 9.6  --   GLUCOSE 151*  --    Lipids Lab Results  Component Value Date   CHOL 147 02/19/2014   HDL 43 02/19/2014   LDLCALC UNABLE TO CALCULATE IF TRIGLYCERIDE OVER 400 mg/dL 02/19/2014   TRIG 521* 02/19/2014   BNP PRO B NATRIURETIC PEPTIDE (BNP)  Date/Time Value Ref Range Status  03/19/2014 11:18 AM 124.0* 0.0 - 100.0 pg/mL Final  03/13/2014 04:54 PM 2091.0* 0 - 450 pg/mL Final  03/05/2014 08:15 AM 1401.0* 0 - 450 pg/mL Final  02/18/2014 02:49 PM 405.6 0 - 450 pg/mL Final   Thyroid Function Tests: No results for input(s): TSH, T4TOTAL, T3FREE, THYROIDAB in the last 72 hours.  Invalid input(s): FREET3    Miscellaneous Lab Results  Component Value Date   DDIMER 0.65* 03/05/2014    Radiology/Studies:  Dg Chest Port 1 View  12/14/2014   CLINICAL DATA:  Left-sided chest pain starting 2 days ago worse this morning with headache and upper back pain. Three previous cardiac stents.  EXAM: PORTABLE CHEST - 1 VIEW  COMPARISON:  03/13/2014  FINDINGS: Lungs are well inflated without consolidation or effusion. Cardiac silhouette is within normal. There is calcified plaque over the aortic arch. Remainder of the exam is unchanged.  IMPRESSION: No active disease.   Electronically Signed   By: [name] M.D.   On: 12/14/2014 07:58    EKG:   Sinus rhythm at 98 Intervals 20/10/36 Nonspecific  T-wave flattening with infrequent PVC new from 10/15  Assessment and Plan:  Chest pain-atypical-point tenderness  Coronary artery disease with remote PCI stenting  2015  HFpEF  Hypertension  Iron deficiency anemia  Question depression  The patient's chest pain does not appear to be cardiac. Initial troponins are normal. Point tenderness reproducing the symptoms suggesting musculoskeletal explanation. Unless her troponins are positive no further cardiac evaluation is indicated.  I will consolidate her diuretics regime changing her from 20 twice daily to 40mg  a day;  this may help with some of the periodic dyspnea.  I will take the liberty of changing her metoprolol 100--50 and adding intermediate dose carvedilol for augmented anti-hypertensive therapy. I will review concerned that using the nonselective beta blocker at very high doses given her history of COPD.  I will order ferritin level to confirm iron deficiency in the context of her microcytosis.  We'll also order stool guaiacs.   I wonder whether she is depressed based on lifestyle choices; it. An. Anti-depressive therapy might be in order.    Virl Axe

## 2014-12-14 NOTE — ED Notes (Signed)
Appointment time @ 09:05a

## 2014-12-14 NOTE — ED Notes (Signed)
Pt had a MI august of last year and had a couple of stents placed. Pt. Started having left sided chest pain with no radiation. Some SOB noted. 10/10 chest pain

## 2014-12-14 NOTE — ED Provider Notes (Signed)
CSN: [id number]     Arrival date & time 12/14/14  9702 History   First MD Initiated Contact with Patient 12/14/14 [phone]     Chief Complaint  Patient presents with  . Chest Pain     (Consider location/radiation/quality/duration/timing/severity/associated sxs/prior Treatment) HPI Comments: Patient is a 81 year old female past medical history significant for COPD, CAD, HLD, HTN, DM, first-degree AV block, MI with stent placement August 2015 presenting to the emergency department for evaluation of left-sided chest pain that began Friday. She states she took one nitroglycerin on Friday with improvement of her pain, returned and has continued since. She endorses associated shortness of breath not alleviated by nebulizer treatments. No modifying factors identified. She is followed by Dr. Angelena Form, last visit approx 3-4 weeks ago.   Patient is a 81 y.o. female presenting with chest pain.  Chest Pain Pain location:  L chest and L lateral chest Pain radiates to:  Does not radiate Pain radiates to the back: no   Pain severity:  Severe Onset quality:  Sudden Duration:  4 days Timing:  Constant Progression:  Worsening Relieved by:  Nitroglycerin Worsened by:  Nothing tried Ineffective treatments:  None tried Associated symptoms: shortness of breath   Risk factors: coronary artery disease, diabetes mellitus, high cholesterol and hypertension   Risk factors: no prior DVT/PE     Past Medical History  Diagnosis Date  . COPD (chronic obstructive pulmonary disease)   . CAD (coronary artery disease) 1990; 2015    Cardiac cath 1990 with Dr. Lia Foyer and [name] Residual CAD, non obstructive per 2015 cath. STEMI status post stent in August 2015.  Marland Kitchen Hyperlipidemia with target LDL less than 70   . Essential hypertension   . Diabetes mellitus type 2,  insulin dependent   . AV block, 1st degree   . 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 Myocardial infarction   . Anginal pain   . Shortness of breath   . Arthritis   . Pericarditis-post MI (short course of steroids) 03/06/2014   Past Surgical History  Procedure Laterality Date  . Cholecystectomy    . Cataract extraction  2010  . Ptca  1990    PTCA of RCA  . Transthoracic echocardiogram  02/02/2012    mild LVH, EF 55-60%, Normal WM, Gr 1 DD; Mild MR  . Coronary angioplasty with stent placement  02/18/14    Promus DES to RCA  . Left heart catheterization with coronary angiogram N/A 02/18/2014    Procedure: LEFT HEART CATHETERIZATION WITH CORONARY ANGIOGRAM;  Surgeon: Jettie Booze, MD;  Location: [facility] CATH LAB;  Service: Cardiovascular;  Laterality: N/A;   Family History  Problem Relation Age of Onset  . Heart attack Mother 67  . Cancer Father 56  . Heart attack Son    History  Substance Use Topics  . Smoking status: Former Smoker -- 1.00 packs/day for 55 years    Types: Cigarettes    Quit date: 09/29/2006  . Smokeless tobacco: Never Used  . Alcohol Use: No   OB History    No data available     Review of Systems  Respiratory: Positive for shortness of breath.   Cardiovascular: Positive for chest pain. Negative for leg swelling.  All other  systems reviewed and are negative.     Allergies  Codeine sulfate and Morphine sulfate  Home Medications   Prior to Admission medications   Medication Sig Start Date End Date Taking? Authorizing Provider  acetaminophen (TYLENOL) 325 MG tablet Take 650 mg by mouth every 6 (six) hours as needed for mild pain.   Yes Historical Provider, MD  albuterol (PROAIR HFA) 108 (90 BASE) MCG/ACT inhaler Inhale 1 puff into the lungs every 4 (four) hours as needed for wheezing or shortness of breath. 06/18/14  Yes [name], MD  albuterol (PROVENTIL) (2.5 MG/3ML)  0.083% nebulizer solution Take 3 mLs (2.5 mg total) by nebulization every 6 (six) hours as needed for wheezing or shortness of breath (for wheezing). 11/05/14  Yes [name], MD  amLODipine (NORVASC) 10 MG tablet Take 1 tablet (10 mg total) by mouth daily. 09/26/14  Yes [name], MD  aspirin 81 MG tablet Take 81 mg by mouth daily.  07/02/14  Yes Historical Provider, MD  EFFIENT 10 MG TABS tablet TAKE 1 TABLET (10 MG TOTAL) BY MOUTH DAILY. 11/21/14  Yes Burnell Blanks, MD  enalapril (VASOTEC) 20 MG tablet Take 1 tablet (20 mg total) by mouth 2 (two) times daily. 08/22/14  Yes [name], MD  fenofibrate micronized (LOFIBRA) 134 MG capsule Take 134 mg by mouth daily before breakfast.   Yes Historical Provider, MD  fish oil-omega-3 fatty acids 1000 MG capsule Take 2 g by mouth daily.     Yes Historical Provider, MD  Fluticasone-Salmeterol (ADVAIR DISKUS) 250-50 MCG/DOSE AEPB Inhale 1 puff into the lungs daily as needed. Patient taking differently: Inhale 1 puff into the lungs every morning.  12/02/14  Yes [name], MD  folic acid (FOLVITE) 563 MCG tablet Take 400 mcg by mouth daily.     Yes Historical Provider, MD  furosemide (LASIX) 20 MG tablet Take 1 tablet (20 mg total) by mouth 2 (two) times daily. 12/02/14  Yes [name], MD  Insulin Glargine (LANTUS SOLOSTAR) 100 UNIT/ML Solostar Pen INJECT 10-15 UNITS INTO THE SKIN Daily Patient taking differently: Inject 15 Units into the skin every morning.  06/20/14  Yes [name], MD  metFORMIN (GLUCOPHAGE) 1000 MG tablet TAKE 1 TABLET BY MOUTH TWO TIMES A DAY 08/20/14  Yes [name], MD  metoprolol succinate (TOPROL-XL) 100 MG 24 hr tablet Take 100 mg by mouth daily. Take with or immediately following a meal.   Yes Historical Provider, MD  nitroGLYCERIN (NITROSTAT) 0.4 MG SL tablet Place 0.4 mg under the tongue every 5 (five) minutes as needed for chest pain.   Yes Historical Provider, MD  pantoprazole  (PROTONIX) 40 MG tablet Take 1 tablet (40 mg total) by mouth daily. 11/27/14  Yes Isaiah Serge, NP  potassium chloride SA (K-DUR,KLOR-CON) 20 MEQ tablet Take 0.5 tablets (10 mEq total) by mouth daily. 03/21/14  Yes Scott Joylene Draft, PA-C  rosuvastatin (CRESTOR) 40 MG tablet Take 40 mg by mouth daily.   Yes Historical Provider, MD  sertraline (ZOLOFT) 25 MG tablet Take 1 tablet (25 mg total) by mouth daily. 12/12/14  Yes [name], MD  sodium chloride (OCEAN) 0.65 % SOLN nasal spray Place 1 spray into both nostrils as needed for congestion.   Yes Historical Provider, MD  tiotropium (SPIRIVA) 18 MCG inhalation capsule Place 18 mcg into inhaler and inhale daily.   Yes Historical Provider, MD  traMADol (ULTRAM) 50 MG tablet Take 1-2 tablets (  50-100 mg total) by mouth every 6 (six) hours as needed for moderate pain (can take 2 tabs once a day, 1 tab other times in the day). 11/28/14  Yes [name], MD  vitamin E 400 UNIT capsule Take 400 Units by mouth daily.     Yes Historical Provider, MD  B-D ULTRAFINE III SHORT PEN 31G X 8 MM MISC USE WITH LANTUS SOLOSTAR 09/08/14   [name], MD   BP 158/70 mmHg  Pulse 84  Temp(Src) 98.7 F (37.1 C) (Oral)  Resp 17  Ht 5\' 5"  (1.651 m)  Wt 174 lb (78.926 kg)  BMI 28.96 kg/m2  SpO2 97% Physical Exam  Constitutional: She is oriented to person, place, and time. She appears well-developed and well-nourished. No distress.  HENT:  Head: Normocephalic and atraumatic.  Right Ear: External ear normal.  Left Ear: External ear normal.  Nose: Nose normal.  Eyes: Conjunctivae are normal.  Neck: Neck supple.  Cardiovascular: Normal rate, regular rhythm, normal heart sounds and intact distal pulses.   Pulmonary/Chest: Effort normal and breath sounds normal.  Abdominal: Soft. There is no tenderness.  Musculoskeletal: She exhibits no edema.  Neurological: She is alert and oriented to person, place, and time.  Skin: Skin is warm and dry. She is not  diaphoretic.  Nursing note and vitals reviewed.   ED Course  Procedures (including critical care time) Medications  sertraline (ZOLOFT) tablet 25 mg (25 mg Oral Given 12/14/14 1103)  mometasone-formoterol (DULERA) 100-5 MCG/ACT inhaler 2 puff (2 puffs Inhalation Given 12/14/14 1128)  pantoprazole (PROTONIX) EC tablet 40 mg (40 mg Oral Given 12/14/14 1104)  prasugrel (EFFIENT) tablet 10 mg (10 mg Oral Given 12/14/14 1104)  albuterol (PROVENTIL) (2.5 MG/3ML) 0.083% nebulizer solution 2.5 mg (not administered)  amLODipine (NORVASC) tablet 10 mg (10 mg Oral Given 12/14/14 1100)  enalapril (VASOTEC) tablet 20 mg (20 mg Oral Given 12/14/14 1104)  aspirin chewable tablet 81 mg (not administered)  potassium chloride (K-DUR,KLOR-CON) CR tablet 10 mEq (10 mEq Oral Given 12/14/14 1104)  nitroGLYCERIN (NITROSTAT) SL tablet 0.4 mg (not administered)  sodium chloride (OCEAN) 0.65 % nasal spray 1 spray (not administered)  fenofibrate tablet 160 mg (160 mg Oral Given 12/14/14 1100)  rosuvastatin (CRESTOR) tablet 40 mg (40 mg Oral Given 12/14/14 1104)  tiotropium (SPIRIVA) inhalation capsule 18 mcg (18 mcg Inhalation Given 12/14/14 1128)  omega-3 acid ethyl esters (LOVAZA) capsule 2 g (2 g Oral Given 12/14/14 1102)  vitamin E capsule 400 Units (400 Units Oral Given 12/14/14 1124)  acetaminophen (TYLENOL) tablet 650 mg (650 mg Oral Given 12/14/14 1549)  ondansetron (ZOFRAN) injection 4 mg (not administered)  heparin injection 5,000 Units (5,000 Units Subcutaneous Not Given 12/14/14 1546)  insulin aspart (novoLOG) injection 0-9 Units (5 Units Subcutaneous Given 12/14/14 1248)  insulin glargine (LANTUS) injection 10 Units (10 Units Subcutaneous Given 3/47/42 5956)  folic acid (FOLVITE) tablet 0.5 mg (0.5 mg Oral Given 12/14/14 1103)  carvedilol (COREG) tablet 12.5 mg (not administered)  metoprolol succinate (TOPROL-XL) 24 hr tablet 50 mg (not administered)  aspirin tablet 325 mg (325 mg Oral Given 12/14/14 0658)   nitroGLYCERIN (NITROGLYN) 2 % ointment 1 inch (1 inch Topical Given 12/14/14 0658)    Labs Review Labs Reviewed  CBC - Abnormal; Notable for the following:    Hemoglobin 11.3 (*)    HCT 35.3 (*)    MCV 72.0 (*)    MCH 23.1 (*)    RDW 17.5 (*)    All other  components within normal limits  BASIC METABOLIC PANEL - Abnormal; Notable for the following:    Glucose, Bld 151 (*)    All other components within normal limits  GLUCOSE, CAPILLARY - Abnormal; Notable for the following:    Glucose-Capillary 156 (*)    All other components within normal limits  CBC - Abnormal; Notable for the following:    Hemoglobin 11.4 (*)    HCT 35.8 (*)    MCV 73.2 (*)    MCH 23.3 (*)    RDW 17.6 (*)    All other components within normal limits  GLUCOSE, CAPILLARY - Abnormal; Notable for the following:    Glucose-Capillary 261 (*)    All other components within normal limits  BRAIN NATRIURETIC PEPTIDE  TROPONIN I  CREATININE, SERUM  TROPONIN I  TROPONIN I  HEMOGLOBIN A1C  FERRITIN  I-STAT TROPOININ, ED    Imaging Review Dg Chest Port 1 View  12/14/2014   CLINICAL DATA:  Left-sided chest pain starting 2 days ago worse this morning with headache and upper back pain. Three previous cardiac stents.  EXAM: PORTABLE CHEST - 1 VIEW  COMPARISON:  03/13/2014  FINDINGS: Lungs are well inflated without consolidation or effusion. Cardiac silhouette is within normal. There is calcified plaque over the aortic arch. Remainder of the exam is unchanged.  IMPRESSION: No active disease.   Electronically Signed   By: [name] M.D.   On: 12/14/2014 07:58     EKG Interpretation   Date/Time:  Sunday Dec 14 2014 06:52:19 EDT Ventricular Rate:  76 PR Interval:  208 QRS Duration: 97 QT Interval:  384 QTC Calculation: 432 R Axis:   -6 Text Interpretation:  Sinus rhythm Ventricular premature complex  Borderline T wave abnormalities No significant change since last tracing  Confirmed by NANAVATI, MD, ANKIT  [phone]) on 12/14/2014 7:58:23 AM      MDM   Final diagnoses:  Chest pain, unspecified chest pain type    Filed Vitals:   12/14/14 1643  BP: 158/70  Pulse: 84  Temp: 98.7 F (37.1 C)  Resp:    I have reviewed nursing notes, vital signs, and all appropriate lab and imaging results if ordered as above.   Concern for cardiac etiology of Chest Pain. Hospitalist has been consulted and will see patient in the ED for likely admit. Pt does not meet criteria for CP protocol and a further evaluation is recommended. Pt has been re-evaluated prior to consult and VSS, NAD, heart RRR, pain 0/10, lungs CTAB. No acute abnormalities found on EKG and first round of cardiac enzymes negative. This case was discussed with Dr. Kathrynn Humble who agrees with plan to admit.      Baron Sane, PA-C 12/14/14 [place], MD 12/15/14 [phone]

## 2014-12-15 ENCOUNTER — Other Ambulatory Visit: Payer: Self-pay | Admitting: Internal Medicine

## 2014-12-15 DIAGNOSIS — E785 Hyperlipidemia, unspecified: Secondary | ICD-10-CM | POA: Diagnosis not present

## 2014-12-15 DIAGNOSIS — R079 Chest pain, unspecified: Secondary | ICD-10-CM

## 2014-12-15 DIAGNOSIS — I10 Essential (primary) hypertension: Secondary | ICD-10-CM

## 2014-12-15 DIAGNOSIS — I2510 Atherosclerotic heart disease of native coronary artery without angina pectoris: Secondary | ICD-10-CM | POA: Diagnosis not present

## 2014-12-15 DIAGNOSIS — R0602 Shortness of breath: Secondary | ICD-10-CM | POA: Diagnosis not present

## 2014-12-15 DIAGNOSIS — M1990 Unspecified osteoarthritis, unspecified site: Secondary | ICD-10-CM | POA: Diagnosis not present

## 2014-12-15 DIAGNOSIS — I25119 Atherosclerotic heart disease of native coronary artery with unspecified angina pectoris: Secondary | ICD-10-CM | POA: Diagnosis not present

## 2014-12-15 DIAGNOSIS — I5032 Chronic diastolic (congestive) heart failure: Secondary | ICD-10-CM

## 2014-12-15 DIAGNOSIS — I440 Atrioventricular block, first degree: Secondary | ICD-10-CM | POA: Diagnosis not present

## 2014-12-15 DIAGNOSIS — I319 Disease of pericardium, unspecified: Secondary | ICD-10-CM | POA: Diagnosis not present

## 2014-12-15 DIAGNOSIS — E119 Type 2 diabetes mellitus without complications: Secondary | ICD-10-CM | POA: Diagnosis not present

## 2014-12-15 LAB — TROPONIN I

## 2014-12-15 LAB — GLUCOSE, CAPILLARY: GLUCOSE-CAPILLARY: 176 mg/dL — AB (ref 65–99)

## 2014-12-15 MED ORDER — FERROUS SULFATE 325 (65 FE) MG PO TABS
325.0000 mg | ORAL_TABLET | Freq: Two times a day (BID) | ORAL | Status: DC
  Administered 2014-12-15: 325 mg via ORAL
  Filled 2014-12-15: qty 1

## 2014-12-15 MED ORDER — FUROSEMIDE 20 MG PO TABS: 40.0000 mg | ORAL_TABLET | Freq: Every day | ORAL | Status: DC

## 2014-12-15 MED ORDER — FUROSEMIDE 40 MG PO TABS
40.0000 mg | ORAL_TABLET | Freq: Every day | ORAL | Status: DC
  Administered 2014-12-15: 40 mg via ORAL
  Filled 2014-12-15: qty 1

## 2014-12-15 MED ORDER — METOPROLOL SUCCINATE ER 50 MG PO TB24: 50.0000 mg | ORAL_TABLET | Freq: Every day | ORAL | Status: DC

## 2014-12-15 MED ORDER — FERROUS SULFATE 325 (65 FE) MG PO TABS: 325.0000 mg | ORAL_TABLET | Freq: Two times a day (BID) | ORAL | Status: DC

## 2014-12-15 MED ORDER — CARVEDILOL 12.5 MG PO TABS: 12.5000 mg | ORAL_TABLET | Freq: Two times a day (BID) | ORAL | Status: DC

## 2014-12-15 NOTE — Progress Notes (Signed)
     SUBJECTIVE: NO chest pain this am. No SOB  BP 177/76 mmHg  Pulse 88  Temp(Src) 98.7 F (37.1 C) (Oral)  Resp 16  Ht 5\' 5"  (1.651 m)  Wt 174 lb (78.926 kg)  BMI 28.96 kg/m2  SpO2 99%  Intake/Output Summary (Last 24 hours) at 12/15/14 9892 Last data filed at 12/14/14 2240  Gross per 24 hour  Intake      0 ml  Output    700 ml  Net   -700 ml    PHYSICAL EXAM General: Well developed, well nourished, in no acute distress. Alert and oriented x 3.  Psych:  Good affect, responds appropriately Neck: No JVD. No masses noted.  Lungs: Clear bilaterally with no wheezes or rhonci noted.  Heart: RRR with no murmurs noted. Abdomen: Bowel sounds are present. Soft, non-tender.  Extremities: No lower extremity edema.   LABS: Basic Metabolic Panel:  Recent Labs  12/14/14 0700 12/14/14 1045  NA 137  --   K 4.1  --   CL 103  --   CO2 26  --   GLUCOSE 151*  --   BUN 11  --   CREATININE 0.74 0.71  CALCIUM 9.6  --    CBC:  Recent Labs  12/14/14 0700 12/14/14 1045  WBC 7.5 8.3  HGB 11.3* 11.4*  HCT 35.3* 35.8*  MCV 72.0* 73.2*  PLT 264 242   Cardiac Enzymes:  Recent Labs  12/14/14 1045 12/14/14 1803 12/14/14 2351  TROPONINI <0.03 <0.03 <0.03   Current Meds: . amLODipine  10 mg Oral Daily  . aspirin  81 mg Oral Daily  . carvedilol  12.5 mg Oral BID WC  . enalapril  20 mg Oral BID  . fenofibrate  160 mg Oral Daily  . ferrous sulfate  325 mg Oral BID WC  . folic acid  0.5 mg Oral Daily  . furosemide  40 mg Oral Daily  . heparin  5,000 Units Subcutaneous 3 times per day  . insulin aspart  0-9 Units Subcutaneous TID WC  . insulin glargine  10 Units Subcutaneous Daily  . metoprolol succinate  50 mg Oral Daily  . mometasone-formoterol  2 puff Inhalation BID  . omega-3 acid ethyl esters  2 g Oral Daily  . pantoprazole  40 mg Oral Daily  . potassium chloride SA  10 mEq Oral Daily  . prasugrel  10 mg Oral Daily  . rosuvastatin  40 mg Oral Daily  . sertraline   25 mg Oral Daily  . tiotropium  18 mcg Inhalation Daily  . vitamin E  400 Units Oral Daily    ASSESSMENT AND PLAN: 81 y.o. female with a hx of diastolic CHF, CAD s/p PTCA of a large dominant RCA in 1990 and inferior STEMI August 2015, DM, HTN, HLD, COPD, former tobacco abuse admitted with chest pain.   1. CAD/chest pain: No evidence of ACS. Troponin negative x 3. EKG without ischemic changes. Her chest pain does not appear to be cardiac related. Would continue current cardiac meds. Coreg added in place of metoprolol.   2. HTN: BP stable. No changes  Ok to discharge from cardiac standpoint.     Michelle [name]  5/30/20168:33 AM

## 2014-12-15 NOTE — Discharge Summary (Signed)
Physician Discharge Summary  ANGELO CAROLL [name]:[id number] DOB: April 17, 1933 DOA: 12/14/2014  PCP: [name], MD  Admit date: 12/14/2014 Discharge date: 12/15/2014  Time spent: 35 minutes  Recommendations for Outpatient Follow-up:  Needs to follow up with PCP for chronic medical management Needs to follow up with cardiology for further adjustment of medications.   Discharge Diagnoses:    Chest pain   IDDM type 2   Hyperlipidemia with target LDL less than 70   Essential hypertension   COPD (chronic obstructive pulmonary disease)   CAD- RCA PCI '90s, STEMI-RCA DES 02/18/14   Chronic diastolic CHF (congestive heart failure)   Discharge Condition: Stable  Diet recommendation: Heart Healthy  Filed Weights   12/14/14 0702  Weight: 78.926 kg (174 lb)    History of present illness:  Michelle [name] is a 81 y.o. female, coronary artery disease status post STEMI August 2015 status post stenting, history of pericardial effusion, diastolic CHF, COPD, IDDM, hyperlipidemia, presents with complaints of chest pain, reports chest pain started last Friday, intermittent, no probing factor, particularly if not by sublingual nitroglycerin at home, much worsened this a.m. which prompted her to come to the ED, NAD pain was relieved by nitroglycerin paste, no acute EKG changes, negative troponins 1, reports mild dyspnea, denies any palpitation, diaphoresis, nausea, received full dose aspirin in ED, reports she has been compliant with her medication including Effient, reports this chest pain is different from the chest pain she had when she had her MI in August/2015, is one is more sharp, left-sided, the one she had in August was midsternal, throbbing quality, hospitalist requested to admit the patient .  Hospital Course:  Chest pain -troponin times 3 negative. Evaluated by cardiology, chest pain does not appears cardiac in origen. Ok to discharge home today per cardio -continue with aspirin, Effient,  sublingual nitroglycerin as needed, currently chest pain-free. -she is chest pain free. Might be MSK pain.   Coronary artery disease - Continue with aspirin, Effient, statin, beta blockers, ACE inhibitor. -metoprolol decrease to 50 mg daily. Started on Coreg.   Diabetes mellitus - Continue with Lantus.   Hyperlipidemia - Continue with statin and fenofibrate.  COPD - No active wheezing, resume home medication.  Hypertension - Blood pressure acceptable, resume home medication.  Chronic diastolic CHF - Appears to be compensated, patient is euvolemic. -Lasix change to 40 mg daily from 20 mg BID by cardio   Procedures:  none  Consultations:  Cardiology  Discharge Exam: Filed Vitals:   12/15/14 0734  BP: 177/76  Pulse: 88  Temp: 98.7 F (37.1 C)  Resp: 16    General: NAD Cardiovascular: S 1, S 2 RRR Respiratory: CTA  Discharge Instructions   Discharge Instructions    Diet - low sodium heart healthy    Complete by:  As directed      Increase activity slowly    Complete by:  As directed           Current Discharge Medication List    START taking these medications   Details  carvedilol (COREG) 12.5 MG tablet Take 1 tablet (12.5 mg total) by mouth 2 (two) times daily with a meal. Qty: 60 tablet, Refills: 0    ferrous sulfate 325 (65 FE) MG tablet Take 1 tablet (325 mg total) by mouth 2 (two) times daily with a meal. Qty: 60 tablet, Refills: 3      CONTINUE these medications which have CHANGED   Details  furosemide (LASIX) 20 MG tablet Take  2 tablets (40 mg total) by mouth daily. Qty: 30 tablet, Refills: 0    metoprolol succinate (TOPROL-XL) 50 MG 24 hr tablet Take 1 tablet (50 mg total) by mouth daily. Take with or immediately following a meal. Qty: 30 tablet, Refills: 0      CONTINUE these medications which have NOT CHANGED   Details  acetaminophen (TYLENOL) 325 MG tablet Take 650 mg by mouth every 6 (six) hours as needed for mild pain.     albuterol (PROAIR HFA) 108 (90 BASE) MCG/ACT inhaler Inhale 1 puff into the lungs every 4 (four) hours as needed for wheezing or shortness of breath. Qty: 18 g, Refills: 5    albuterol (PROVENTIL) (2.5 MG/3ML) 0.083% nebulizer solution Take 3 mLs (2.5 mg total) by nebulization every 6 (six) hours as needed for wheezing or shortness of breath (for wheezing). Qty: 75 mL, Refills: 6    amLODipine (NORVASC) 10 MG tablet Take 1 tablet (10 mg total) by mouth daily. Qty: 30 tablet, Refills: 5    aspirin 81 MG tablet Take 81 mg by mouth daily.     EFFIENT 10 MG TABS tablet TAKE 1 TABLET (10 MG TOTAL) BY MOUTH DAILY. Qty: 30 tablet, Refills: 5    enalapril (VASOTEC) 20 MG tablet Take 1 tablet (20 mg total) by mouth 2 (two) times daily. Qty: 60 tablet, Refills: 5    fenofibrate micronized (LOFIBRA) 134 MG capsule Take 134 mg by mouth daily before breakfast.    fish oil-omega-3 fatty acids 1000 MG capsule Take 2 g by mouth daily.      Fluticasone-Salmeterol (ADVAIR DISKUS) 250-50 MCG/DOSE AEPB Inhale 1 puff into the lungs daily as needed. Qty: 60 each, Refills: 5    folic acid (FOLVITE) 269 MCG tablet Take 400 mcg by mouth daily.      Insulin Glargine (LANTUS SOLOSTAR) 100 UNIT/ML Solostar Pen INJECT 10-15 UNITS INTO THE SKIN Daily Qty: 15 mL, Refills: 11    metFORMIN (GLUCOPHAGE) 1000 MG tablet TAKE 1 TABLET BY MOUTH TWO TIMES A DAY Qty: 60 tablet, Refills: 4    nitroGLYCERIN (NITROSTAT) 0.4 MG SL tablet Place 0.4 mg under the tongue every 5 (five) minutes as needed for chest pain.    pantoprazole (PROTONIX) 40 MG tablet Take 1 tablet (40 mg total) by mouth daily. Qty: 30 tablet, Refills: 6    potassium chloride SA (K-DUR,KLOR-CON) 20 MEQ tablet Take 0.5 tablets (10 mEq total) by mouth daily.   Associated Diagnoses: Acute diastolic CHF (congestive heart failure); Essential hypertension    rosuvastatin (CRESTOR) 40 MG tablet Take 40 mg by mouth daily.    sertraline (ZOLOFT) 25 MG  tablet Take 1 tablet (25 mg total) by mouth daily. Qty: 30 tablet, Refills: 1    sodium chloride (OCEAN) 0.65 % SOLN nasal spray Place 1 spray into both nostrils as needed for congestion.    tiotropium (SPIRIVA) 18 MCG inhalation capsule Place 18 mcg into inhaler and inhale daily.    traMADol (ULTRAM) 50 MG tablet Take 1-2 tablets (50-100 mg total) by mouth every 6 (six) hours as needed for moderate pain (can take 2 tabs once a day, 1 tab other times in the day). Qty: 60 tablet, Refills: 5    vitamin E 400 UNIT capsule Take 400 Units by mouth daily.      B-D ULTRAFINE III SHORT PEN 31G X 8 MM MISC USE WITH LANTUS SOLOSTAR Qty: 100 each, Refills: 11       Allergies  Allergen Reactions  .  Codeine Sulfate Nausea Only  . Morphine Sulfate Nausea Only      The results of significant diagnostics from this hospitalization (including imaging, microbiology, ancillary and laboratory) are listed below for reference.    Significant Diagnostic Studies: Dg Chest Port 1 View  12/14/2014   CLINICAL DATA:  Left-sided chest pain starting 2 days ago worse this morning with headache and upper back pain. Three previous cardiac stents.  EXAM: PORTABLE CHEST - 1 VIEW  COMPARISON:  03/13/2014  FINDINGS: Lungs are well inflated without consolidation or effusion. Cardiac silhouette is within normal. There is calcified plaque over the aortic arch. Remainder of the exam is unchanged.  IMPRESSION: No active disease.   Electronically Signed   By: [name] M.D.   On: 12/14/2014 07:58    Microbiology: No results found for this or any previous visit (from the past 240 hour(s)).   Labs: Basic Metabolic Panel:  Recent Labs Lab 12/14/14 0700 12/14/14 1045  NA 137  --   K 4.1  --   CL 103  --   CO2 26  --   GLUCOSE 151*  --   BUN 11  --   CREATININE 0.74 0.71  CALCIUM 9.6  --    Liver Function Tests: No results for input(s): AST, ALT, ALKPHOS, BILITOT, PROT, ALBUMIN in the last 168 hours. No  results for input(s): LIPASE, AMYLASE in the last 168 hours. No results for input(s): AMMONIA in the last 168 hours. CBC:  Recent Labs Lab 12/14/14 0700 12/14/14 1045  WBC 7.5 8.3  HGB 11.3* 11.4*  HCT 35.3* 35.8*  MCV 72.0* 73.2*  PLT 264 242   Cardiac Enzymes:  Recent Labs Lab 12/14/14 1045 12/14/14 1803 12/14/14 2351  TROPONINI <0.03 <0.03 <0.03   BNP: BNP (last 3 results)  Recent Labs  12/14/14 0700  BNP 52.5    ProBNP (last 3 results)  Recent Labs  03/05/14 0815 03/13/14 1654 03/19/14 1118  PROBNP 1401.0* 2091.0* 124.0*    CBG:  Recent Labs Lab 12/14/14 0915 12/14/14 1143 12/14/14 1641 12/14/14 2036 12/15/14 0731  GLUCAP 156* 261* 127* 219* 176*       Signed:  Devian Bartolomei A  [facility] 12/15/2014, 8:17 AM

## 2014-12-16 ENCOUNTER — Ambulatory Visit: Payer: Commercial Managed Care - HMO | Admitting: Family Medicine

## 2014-12-16 LAB — HEMOGLOBIN A1C
Hgb A1c MFr Bld: 7.6 % — ABNORMAL HIGH (ref 4.8–5.6)
Mean Plasma Glucose: 171 mg/dL

## 2014-12-27 ENCOUNTER — Other Ambulatory Visit: Payer: Self-pay | Admitting: Internal Medicine

## 2014-12-29 ENCOUNTER — Other Ambulatory Visit: Payer: Self-pay | Admitting: *Deleted

## 2014-12-29 NOTE — Patient Outreach (Signed)
[place] [facility]) Care Management  12/29/2014  ALBIRDA SHIEL 1932/12/24 [id number]   Past case management patient of mine. I called to follow up to see if pt had ever received an emergency alert. She reported she had not. I also discovered that pt has had an overnight in the hospital at the end of May for chest pain. We discussed her current living arrangement which is living at home and a son lives with her but is now employed full time. She says she spends a lot of time in bed now because her back hurts and her walking ability is not great and she is afraid of falling. She reports she did not have a cardiac event when she went to the hospital. We discussed me checking up on her weekly for the next month by phone and the possiblity of looking into to an emergency alert. I will forward this information to Bary Castilla, Assistant Director [facility] and I will call pt weekly for the next 3 weeks and then possibly turn her over to telephone case management.  Deloria Lair [facility] [place] [phone]

## 2015-01-01 NOTE — Patient Outreach (Signed)
[place] [facility]) Care Management  01/01/2015  Michelle [name] 06-Aug-1932 [id number]   Notification from Deloria Lair, NP to open case for [facility] calls.  Michelle [name]. [place], [place]  Management [place] Assistant Phone: [phone] Fax: [phone]

## 2015-01-05 ENCOUNTER — Telehealth: Payer: Self-pay | Admitting: *Deleted

## 2015-01-05 NOTE — Patient Outreach (Signed)
Transition of care call - Pt did not answer her phone today, this is not unusual. I will call at another time. I was not able to leave a message today the answering machine was not on.  Michelle [name] [facility] [place] [phone]

## 2015-01-05 NOTE — Patient Outreach (Signed)
Unable to reach pt. On first try today.  I was able to speak to pt today. She reports she is doing well. Her weight is stable. Her glucose is running within normal limits. She denies any falls. She reports she was not able to get an appt with her primary care provider or cardiologist until July which is way past the desired 1-2 weeks but this is all they had to offer.  I encouraged Michelle [name] to continue her daily monitorinig, follow safety precautions to avoid falls and to call me if she has any issues to discuss.  I will call her again in a week.  Deloria Lair [facility] [place] [phone]

## 2015-01-09 ENCOUNTER — Other Ambulatory Visit: Payer: Self-pay

## 2015-01-09 ENCOUNTER — Telehealth: Payer: Self-pay | Admitting: Family Medicine

## 2015-01-09 MED ORDER — CARVEDILOL 12.5 MG PO TABS: 12.5000 mg | ORAL_TABLET | Freq: Two times a day (BID) | ORAL | Status: DC

## 2015-01-09 MED ORDER — METOPROLOL SUCCINATE ER 50 MG PO TB24: 50.0000 mg | ORAL_TABLET | Freq: Every day | ORAL | Status: DC

## 2015-01-09 NOTE — Addendum Note (Signed)
Addended by: Clyde Lundborg A on: 01/09/2015 11:16 AM   Modules accepted: Orders

## 2015-01-09 NOTE — Telephone Encounter (Signed)
Refill request for Carvedilol 12.5 mg and send to [facility].

## 2015-01-12 ENCOUNTER — Telehealth: Payer: Self-pay | Admitting: *Deleted

## 2015-01-12 NOTE — Patient Outreach (Signed)
Transition of care call #3. Pt is doing especially well today. She sounds cheerful. SHe denies any CP, SOB. She does report she is taking a prn lasix in the evenings if she has ankle and foot swelling. Sometimes this keeps her awake at night. Her knees still give her problems. She states she has all her MD appts scheduled for July.  I have asked her to continue her monitoring and if she has questions to call me. Other wise I will check in on her again next week.  Michelle [name] [facility] [place] [phone]

## 2015-01-13 ENCOUNTER — Other Ambulatory Visit: Payer: Self-pay | Admitting: *Deleted

## 2015-01-13 MED ORDER — FUROSEMIDE 40 MG PO TABS: 40.0000 mg | ORAL_TABLET | Freq: Every day | ORAL | Status: DC

## 2015-01-20 ENCOUNTER — Other Ambulatory Visit: Payer: Self-pay | Admitting: *Deleted

## 2015-01-20 NOTE — Patient Outreach (Signed)
Pt states she is doing very well. She does admit to a 9# weight gain since she was in the hospital, however. She denies any SOB or edema and no chest pain. She does have a primary care appt this Friday. I told her it was very important to tell Dr. Yong Channel about her weight gain as he may want to increase her fluid pill for a few days. She said she would do this. I told her I would like to call her one more time to ensure she is doing OK next week and to see what her MD did on the appt. She agreed to this.  I will send Dr. Yong Channel this update for his information.  Deloria Lair [facility] [phone]

## 2015-01-23 ENCOUNTER — Ambulatory Visit (INDEPENDENT_AMBULATORY_CARE_PROVIDER_SITE_OTHER): Payer: Commercial Managed Care - HMO | Admitting: Family Medicine

## 2015-01-23 ENCOUNTER — Encounter: Payer: Self-pay | Admitting: Family Medicine

## 2015-01-23 ENCOUNTER — Other Ambulatory Visit: Payer: Self-pay | Admitting: Internal Medicine

## 2015-01-23 VITALS — BP 130/64 | HR 75 | Temp 98.0°F | Wt 177.0 lb

## 2015-01-23 DIAGNOSIS — Z794 Long term (current) use of insulin: Secondary | ICD-10-CM

## 2015-01-23 DIAGNOSIS — E119 Type 2 diabetes mellitus without complications: Secondary | ICD-10-CM | POA: Diagnosis not present

## 2015-01-23 DIAGNOSIS — I5032 Chronic diastolic (congestive) heart failure: Secondary | ICD-10-CM | POA: Diagnosis not present

## 2015-01-23 DIAGNOSIS — I25118 Atherosclerotic heart disease of native coronary artery with other forms of angina pectoris: Secondary | ICD-10-CM | POA: Diagnosis not present

## 2015-01-23 NOTE — Patient Instructions (Addendum)
Glad the chest pain is better. This may have been a muscle spasm or pull.   Increase Lantus to 16 units daily.   No other changes today  Let's check in early september

## 2015-01-23 NOTE — Progress Notes (Signed)
[name], MD  Subjective:  Michelle [name] is a 81 y.o. year old very pleasant female patient who presents with:  Hospital Follow up -hospitalized late May for atypical chest pain in patient with known CAD. She was having left sided rib pain that was extremely sharp-different from substernal squeeze with MI. Relieved with nitro paste. EKG without acute changes and troponin negative x 3. Mild shortness of breath at time of admission but otherwise ROS largely negative. Cardiology evaluated and did not think cardiac in origin, likely MSK. She was continued on aspirin, effient, sublingual nitro. She remained chest pain free after nitro but her pain was reproducible when pushing on left sided rib cage. The only other change during stay was for chronic diastolic CHF she was changed to 40mg  lasix daily instead of 20mg  BID. She had reported to team health 9 lb weigh tgain. Today she states weight stable since beginning of month. Her weigh today is a lb down from 5/13 and a few lbs up from 5/29. She did take an extra 20mg  lasix last night due to some edema worse than normal but has not noted any today.   Diabetes  Her CBGs at home have been in 140s and 150s fasting for most part but ranging from 120s to 170s ROS- no chest pain, shortness of breath, hypoglycemia, blurry vision  Past Medical History- CAD, diastolic CHF, IDDM 2, anxiety, copd, HTN, HLD  Medications- reviewed and updated Current Outpatient Prescriptions  Medication Sig Dispense Refill  . amLODipine (NORVASC) 10 MG tablet Take 1 tablet (10 mg total) by mouth daily. 30 tablet 5  . aspirin 81 MG tablet Take 81 mg by mouth daily.     . B-D ULTRAFINE III SHORT PEN 31G X 8 MM MISC USE WITH LANTUS SOLOSTAR 100 each 11  . carvedilol (COREG) 12.5 MG tablet Take 1 tablet (12.5 mg total) by mouth 2 (two) times daily with a meal. 60 tablet 3  . EFFIENT 10 MG TABS tablet TAKE 1 TABLET (10 MG TOTAL) BY MOUTH DAILY. 30 tablet 5  . ferrous sulfate  325 (65 FE) MG tablet Take 1 tablet (325 mg total) by mouth 2 (two) times daily with a meal. 60 tablet 3  . fish oil-omega-3 fatty acids 1000 MG capsule Take 2 g by mouth daily.      . Fluticasone-Salmeterol (ADVAIR DISKUS) 250-50 MCG/DOSE AEPB Inhale 1 puff into the lungs daily as needed. (Patient taking differently: Inhale 1 puff into the lungs every morning. ) 60 each 5  . folic acid (FOLVITE) 325 MCG tablet Take 400 mcg by mouth daily.      . furosemide (LASIX) 40 MG tablet Take 1 tablet (40 mg total) by mouth daily. 90 tablet 0  . Insulin Glargine (LANTUS SOLOSTAR) 100 UNIT/ML Solostar Pen INJECT 10-15 UNITS INTO THE SKIN Daily (Patient taking differently: Inject 15 Units into the skin every morning. ) 15 mL 11  . metFORMIN (GLUCOPHAGE) 1000 MG tablet TAKE 1 TABLET BY MOUTH TWO TIMES A DAY 60 tablet 4  . nitroGLYCERIN (NITROSTAT) 0.4 MG SL tablet Place 0.4 mg under the tongue every 5 (five) minutes as needed for chest pain.    . pantoprazole (PROTONIX) 40 MG tablet Take 1 tablet (40 mg total) by mouth daily. 30 tablet 6  . Potassium Chloride ER 20 MEQ TBCR TAKE 1 TABLET BY MOUTH ONCE A DAY 90 tablet 2  . rosuvastatin (CRESTOR) 40 MG tablet Take 40 mg by mouth daily.    Marland Kitchen  sertraline (ZOLOFT) 25 MG tablet Take 1 tablet (25 mg total) by mouth daily. 30 tablet 1  . traMADol (ULTRAM) 50 MG tablet Take 1-2 tablets (50-100 mg total) by mouth every 6 (six) hours as needed for moderate pain (can take 2 tabs once a day, 1 tab other times in the day). 60 tablet 5  . vitamin E 400 UNIT capsule Take 400 Units by mouth daily.      Marland Kitchen acetaminophen (TYLENOL) 325 MG tablet Take 650 mg by mouth every 6 (six) hours as needed for mild pain.    Marland Kitchen albuterol (PROAIR HFA) 108 (90 BASE) MCG/ACT inhaler Inhale 1 puff into the lungs every 4 (four) hours as needed for wheezing or shortness of breath. (Patient not taking: Reported on 01/23/2015) 18 g 5  . albuterol (PROVENTIL) (2.5 MG/3ML) 0.083% nebulizer solution Take 3 mLs  (2.5 mg total) by nebulization every 6 (six) hours as needed for wheezing or shortness of breath (for wheezing). (Patient not taking: Reported on 01/23/2015) 75 mL 6  . metoprolol succinate (TOPROL-XL) 50 MG 24 hr tablet Take 1 tablet (50 mg total) by mouth daily. Take with or immediately following a meal. 30 tablet 5  . sodium chloride (OCEAN) 0.65 % SOLN nasal spray Place 1 spray into both nostrils as needed for congestion.    Marland Kitchen SPIRIVA HANDIHALER 18 MCG inhalation capsule INHALE CONTENTS OF ONE CAPSULE DAILY (Patient not taking: Reported on 01/23/2015) 30 capsule 10   Objective: BP 130/64 mmHg  Pulse 75  Temp(Src) 98 F (36.7 C)  Wt 177 lb (80.287 kg) Gen: NAD, resting comfortably in wheelchair CV: RRR no murmurs rubs or gallops No chest wall pain Lungs: CTAB no crackles, wheeze, rhonchi Abdomen: soft/nontender/nondistended/normal bowel sounds. No rebound or guarding.  Ext: no edema noted (states took an extra 20mg  lasix yesterday which helped) Skin: warm, dry, no rash Neuro: grossly normal, moves all extremities   Assessment/Plan:  CAD- RCA PCI '90s, STEMI-RCA DES 02/18/14 Atypical chest pain in patient with CAD hx. Workup in hospital reassuring. Pain was reproducible and likely MSK. She is to be continued on chronic therapy including aspirin, effient, prn nitro and crestor.   Chronic diastolic CHF (congestive heart failure) Controlled with Lasix 40mg  daily. I told patient if she has to take more than 2 days of lasix 20mg  extra in future to call us immediately.   IDDM type 2 Mild poor control fasting sugars- titrate up to 16 units lantus from 15 (fasting 140s, 150s). Continue metformin. F/u early September for f/u including repeat a1c   early September f/u

## 2015-01-23 NOTE — Assessment & Plan Note (Signed)
Mild poor control fasting sugars- titrate up to 16 units lantus from 15 (fasting 140s, 150s). Continue metformin. F/u early September for f/u including repeat a1c

## 2015-01-23 NOTE — Assessment & Plan Note (Signed)
Atypical chest pain in patient with CAD hx. Workup in hospital reassuring. Pain was reproducible and likely MSK. She is to be continued on chronic therapy including aspirin, effient, prn nitro and crestor.

## 2015-01-23 NOTE — Assessment & Plan Note (Signed)
Controlled with Lasix 40mg  daily. I told patient if she has to take more than 2 days of lasix 20mg  extra in future to call us immediately.

## 2015-01-25 NOTE — Progress Notes (Signed)
Cardiology Office Note   Date:  01/25/2015   ID:  Michelle [name], DOB 11/04/1932, MRN [id number]  PCP:  [name], MD  Cardiologist:  Dr. Lauree Chandler     Chief Complaint  Patient presents with  . Coronary Artery Disease     History of Present Illness: Michelle [name] is a 81 y.o. female with a hx of   Admitted 5/29-5/30  Michelle [name] is a 81 y.o. female, coronary artery disease status post STEMI August 2015 status post stenting, history of pericardial effusion, diastolic CHF, COPD, IDDM, hyperlipidemia, presents with complaints of chest pain, reports chest pain started last Friday, intermittent, no probing factor, particularly if not by sublingual nitroglycerin at home, much worsened this a.m. which prompted her to come to the ED, NAD pain was relieved by nitroglycerin paste, no acute EKG changes, negative troponins 1, reports mild dyspnea, denies any palpitation, diaphoresis, nausea, received full dose aspirin in ED, reports she has been compliant with her medication including Effient, reports this chest pain is different from the chest pain she had when she had her MI in August/2015, is one is more sharp, left-sided, the one she had in August was midsternal, throbbing quality, hospitalist requested to admit the patient .  Hospital Course:  Chest pain -troponin times 3 negative. Evaluated by cardiology, chest pain does not appears cardiac in origen. Ok to discharge home today per cardio -continue with aspirin, Effient, sublingual nitroglycerin as needed, currently chest pain-free. -she is chest pain free. Might be MSK pain.   Coronary artery disease - Continue with aspirin, Effient, statin, beta blockers, ACE inhibitor. -metoprolol decrease to 50 mg daily. Started on Coreg.   Diabetes mellitus - Continue with Lantus.   Hyperlipidemia - Continue with statin and fenofibrate.  COPD - No active wheezing, resume home medication.  Hypertension - Blood  pressure acceptable, resume home medication.  Chronic diastolic CHF - Appears to be compensated, patient is euvolemic. -Lasix change to 40 mg daily from 20 mg BID by cardio   Studies/Reports Reviewed Today:  Echo 03/05/14 - Left ventricle: The cavity size was normal. Systolic function was normal. The estimated ejection fraction was in the range of 60% to 65%. Wall motion was normal; there were no regional wall motion abnormalities. - Aortic valve: There was mild to moderate regurgitation. - Mitral valve: There was mild regurgitation. - Left atrium: The atrium was mildly dilated. - Pericardium, extracardiac: A trivial pericardial effusion was identified posterior to the heart.  LHC/PCI 02/18/14 Normal left main coronary artery. Mild disease in the mid left anterior descending artery and its branches. Mild disease in the left circumflex artery and its branches. 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 Treated with a 2.8 x 19 mm Covered stent. Distal to stent, a 2.75 x 20 mm Promus was deployed. Proximal to the covered stent, a 3.0 x 8 mm stent was deployed. Entire stented area dilated to 3.3 mm in diameter.  Normal left ventricular systolic function. LVEDP 19 mmHg. Ejection fraction 55%.  Myoview 03/2012 Normal stress nuclear study. LV Ejection Fraction: 74%. LV Wall Motion: NL LV Function; NL Wall Motion   Past Medical History  Diagnosis Date  . COPD (chronic obstructive pulmonary disease)   . CAD (coronary artery disease) 1990; 2015    Cardiac cath 1990 with Dr. Lia Foyer and pt reports blockage in artery  with angioplasty. She has pictures that show severe stenosis mid RCA and a post PTCA picture with 30%  residual stenosis post PTCA. Residual CAD, non obstructive per 2015 cath. STEMI status post stent in August 2015.  Marland Kitchen Hyperlipidemia with target LDL less than 70   . Essential hypertension   . Diabetes mellitus type 2, insulin  dependent   . AV block, 1st degree   . 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 Myocardial infarction   . Anginal pain   . Shortness of breath   . Arthritis   . Pericarditis-post MI (short course of steroids) 03/06/2014    Past Surgical History  Procedure Laterality Date  . Cholecystectomy    . Cataract extraction  2010  . Ptca  1990    PTCA of RCA  . Transthoracic echocardiogram  02/02/2012    mild LVH, EF 55-60%, Normal WM, Gr 1 DD; Mild MR  . Coronary angioplasty with stent placement  02/18/14    Promus DES to RCA  . Left heart catheterization with coronary angiogram N/A 02/18/2014    Procedure: LEFT HEART CATHETERIZATION WITH CORONARY ANGIOGRAM;  Surgeon: Jettie Booze, MD;  Location: [facility] CATH LAB;  Service: Cardiovascular;  Laterality: N/A;     Current Outpatient Prescriptions  Medication Sig Dispense Refill  . acetaminophen (TYLENOL) 325 MG tablet Take 650 mg by mouth every 6 (six) hours as needed for mild pain.    Marland Kitchen albuterol (PROAIR HFA) 108 (90 BASE) MCG/ACT inhaler Inhale 1 puff into the lungs every 4 (four) hours as needed for wheezing or shortness of breath. (Patient not taking: Reported on 01/23/2015) 18 g 5  . albuterol (PROVENTIL) (2.5 MG/3ML) 0.083% nebulizer solution Take 3 mLs (2.5 mg total) by nebulization every 6 (six) hours as needed for wheezing or shortness of breath (for wheezing). (Patient not taking: Reported on 01/23/2015) 75 mL 6  . amLODipine (NORVASC) 10 MG tablet Take 1 tablet (10 mg total) by mouth daily. 30 tablet 5  . aspirin 81 MG tablet Take 81 mg by mouth daily.     . B-D ULTRAFINE III SHORT PEN 31G X 8 MM MISC USE WITH LANTUS SOLOSTAR 100 each 11  . carvedilol (COREG) 12.5 MG tablet Take 1 tablet (12.5 mg total) by mouth 2 (two) times daily with a meal. 60 tablet 3  . EFFIENT 10 MG TABS tablet TAKE 1 TABLET (10 MG TOTAL) BY MOUTH DAILY. 30 tablet 5  . ferrous sulfate 325  (65 FE) MG tablet Take 1 tablet (325 mg total) by mouth 2 (two) times daily with a meal. 60 tablet 3  . fish oil-omega-3 fatty acids 1000 MG capsule Take 2 g by mouth daily.      . Fluticasone-Salmeterol (ADVAIR DISKUS) 250-50 MCG/DOSE AEPB Inhale 1 puff into the lungs daily as needed. (Patient taking differently: Inhale 1 puff into the lungs every morning. ) 60 each 5  . folic acid (FOLVITE) 086 MCG tablet Take 400 mcg by mouth daily.      . furosemide (LASIX) 40 MG tablet Take 1 tablet (40 mg total) by mouth daily. 90 tablet 0  . Insulin Glargine (LANTUS SOLOSTAR) 100 UNIT/ML Solostar Pen INJECT 10-15 UNITS INTO THE SKIN Daily (Patient taking differently: Inject 15 Units into the skin every morning. ) 15 mL 11  . metFORMIN (GLUCOPHAGE) 1000 MG tablet TAKE 1 TABLET BY MOUTH TWO TIMES A DAY 60 tablet 4  . metoprolol succinate (TOPROL-XL) 50 MG 24 hr tablet Take 1 tablet (50 mg total)  by mouth daily. Take with or immediately following a meal. 30 tablet 5  . nitroGLYCERIN (NITROSTAT) 0.4 MG SL tablet Place 0.4 mg under the tongue every 5 (five) minutes as needed for chest pain.    . pantoprazole (PROTONIX) 40 MG tablet Take 1 tablet (40 mg total) by mouth daily. 30 tablet 6  . Potassium Chloride ER 20 MEQ TBCR TAKE 1 TABLET BY MOUTH ONCE A DAY 90 tablet 2  . rosuvastatin (CRESTOR) 40 MG tablet Take 40 mg by mouth daily.    . sertraline (ZOLOFT) 25 MG tablet Take 1 tablet (25 mg total) by mouth daily. 30 tablet 1  . sodium chloride (OCEAN) 0.65 % SOLN nasal spray Place 1 spray into both nostrils as needed for congestion.    Marland Kitchen SPIRIVA HANDIHALER 18 MCG inhalation capsule INHALE CONTENTS OF ONE CAPSULE DAILY (Patient not taking: Reported on 01/23/2015) 30 capsule 10  . traMADol (ULTRAM) 50 MG tablet Take 1-2 tablets (50-100 mg total) by mouth every 6 (six) hours as needed for moderate pain (can take 2 tabs once a day, 1 tab other times in the day). 60 tablet 5  . vitamin E 400 UNIT capsule Take 400 Units by  mouth daily.       No current facility-administered medications for this visit.    Allergies:   Codeine sulfate and Morphine sulfate    Social History:  The patient  reports that she quit smoking about 8 years ago. Her smoking use included Cigarettes. She has a 55 pack-year smoking history. She has never used smokeless tobacco. She reports that she does not drink alcohol or use illicit drugs.   Family History:  The patient's family history includes Cancer (age of onset: 29) in her father; Heart attack in her son; Heart attack (age of onset: 41) in her mother.    ROS:   Please see the history of present illness.   ROS    PHYSICAL EXAM: VS:  There were no vitals taken for this visit.    Wt Readings from Last 3 Encounters:  01/23/15 177 lb (80.287 kg)  12/14/14 174 lb (78.926 kg)  11/28/14 178 lb (80.74 kg)     GEN: Well nourished, well developed, in no acute distress HEENT: normal Neck: no JVD, no carotid bruits, no masses Cardiac:  Normal S1/S2, RRR; no murmur ,  no rubs or gallops, no edema  Respiratory:  clear to auscultation bilaterally, no wheezing, rhonchi or rales. GI: soft, nontender, nondistended, + BS MS: no deformity or atrophy Skin: warm and dry  Neuro:  CNs II-XII intact, Strength and sensation are intact Psych: Normal affect   EKG:  EKG is ordered today.  It demonstrates:      Recent Labs: 03/05/2014: Magnesium 1.5; TSH 0.936 03/15/2014: ALT 20 03/19/2014: Pro B Natriuretic peptide (BNP) 124.0* 12/14/2014: B Natriuretic Peptide 52.5; BUN 11; Creatinine, Ser 0.71; Hemoglobin 11.4*; Platelets 242; Potassium 4.1; Sodium 137    Lipid Panel    Component Value Date/Time   CHOL 147 02/19/2014 0228   TRIG 521* 02/19/2014 0228   TRIG 1693* 07/28/2006 1202   HDL 43 02/19/2014 0228   CHOLHDL 3.4 02/19/2014 0228   CHOLHDL 9.2 CALC 07/28/2006 1202   VLDL UNABLE TO CALCULATE IF TRIGLYCERIDE OVER 400 mg/dL 02/19/2014 0228   LDLCALC UNABLE TO CALCULATE IF  TRIGLYCERIDE OVER 400 mg/dL 02/19/2014 0228   LDLDIRECT 29.0 09/22/2014 1500   LDLDIRECT 18.6 07/28/2006 1202      ASSESSMENT AND PLAN:  No diagnosis  found.     Medication Changes: Current medicines are reviewed at length with the patient today.  Concerns regarding medicines are as outlined above.  The following changes have been made:   Discontinued Medications   No medications on file   Modified Medications   No medications on file   New Prescriptions   No medications on file     Labs/ tests ordered today include:   No orders of the defined types were placed in this encounter.     Disposition:   FU with    Signed, Richardson Dopp, PA-C, MHS 01/25/2015 10:05 PM    [facility] [address], [place], [place]  [address] Phone: [phone]; Fax: [phone]    This encounter was created in error - please disregard.

## 2015-01-26 ENCOUNTER — Other Ambulatory Visit: Payer: Self-pay | Admitting: *Deleted

## 2015-01-26 ENCOUNTER — Encounter: Payer: Commercial Managed Care - HMO | Admitting: Physician Assistant

## 2015-01-26 NOTE — Patient Outreach (Signed)
Last Transition of care call. Pt is doing fair. She went to a grandchild's 4th birthday and had to ride in the car 2 hours and that has make her knees hurt more that usual. Other wise her glucose level was fair this am (157). She saw her primary car MD last week and will see her cardiologist today. She continues to weigh daily and eat appropriately  I have advised her this is my last Transition of care call and she says she really wishes the calls could continue. I asked her if she would like another nurse to call her once a month for awhile and she said yes. I will refer her to the disease management department.  [facility] CM Care Plan Problem One        Patient Outreach Telephone from 01/26/2015 in [facility]   Care Plan Problem One  Hospitalization for chest pain, no cardiac event.   Care Plan for Problem One  Active   [facility] CM Short Term Goal #1 (0-30 days)  Pt will not be readmitted in 30 days of hospital discharge.   [facility] CM Short Term Goal #1 Start Date  12/29/14   [facility] CM Short Term Goal #1 Met Date  01/26/15   Interventions for Short Term Goal #1  Will refer to [facility] Disease Management Telephonic Care Manager.       GNP-BC [facility] Care Manager [phone] . 

## 2015-01-27 ENCOUNTER — Other Ambulatory Visit: Payer: Self-pay | Admitting: Internal Medicine

## 2015-01-28 ENCOUNTER — Other Ambulatory Visit: Payer: Self-pay | Admitting: *Deleted

## 2015-01-29 ENCOUNTER — Other Ambulatory Visit: Payer: Self-pay | Admitting: Family Medicine

## 2015-01-29 MED ORDER — SERTRALINE HCL 25 MG PO TABS: 25.0000 mg | ORAL_TABLET | Freq: Every day | ORAL | Status: DC

## 2015-01-29 NOTE — Telephone Encounter (Signed)
Sent to the pharmacy by e-scribe.  Pt has upcoming appt on 04/03/15

## 2015-02-06 ENCOUNTER — Ambulatory Visit: Payer: Commercial Managed Care - HMO | Admitting: Family Medicine

## 2015-02-06 ENCOUNTER — Ambulatory Visit (INDEPENDENT_AMBULATORY_CARE_PROVIDER_SITE_OTHER): Payer: Commercial Managed Care - HMO | Admitting: Physician Assistant

## 2015-02-06 ENCOUNTER — Encounter: Payer: Self-pay | Admitting: Physician Assistant

## 2015-02-06 VITALS — BP 138/62 | HR 65 | Wt 177.0 lb

## 2015-02-06 DIAGNOSIS — J449 Chronic obstructive pulmonary disease, unspecified: Secondary | ICD-10-CM

## 2015-02-06 DIAGNOSIS — I10 Essential (primary) hypertension: Secondary | ICD-10-CM

## 2015-02-06 DIAGNOSIS — E785 Hyperlipidemia, unspecified: Secondary | ICD-10-CM

## 2015-02-06 DIAGNOSIS — I5032 Chronic diastolic (congestive) heart failure: Secondary | ICD-10-CM

## 2015-02-06 DIAGNOSIS — I2510 Atherosclerotic heart disease of native coronary artery without angina pectoris: Secondary | ICD-10-CM | POA: Diagnosis not present

## 2015-02-06 NOTE — Patient Instructions (Signed)
Medication Instructions:  Your physician recommends that you continue on your current medications as directed. Please refer to the Current Medication list given to you today.   Labwork: NONE  Testing/Procedures: NONE  Follow-Up: 05/22/15 @ 9 AM WITH DR. Angelena Form  Any Other Special Instructions Will Be Listed Below (If Applicable).

## 2015-02-06 NOTE — Progress Notes (Signed)
Cardiology Office Note   Date:  02/06/2015   ID:  Michelle [name], DOB 1932/09/30, MRN [id number]  PCP:  [name], MD  Cardiologist:  Dr. Lauree Chandler     Chief Complaint  Patient presents with  . Hospitalization Follow-up    admit with chest pain  . Coronary Artery Disease     History of Present Illness: Michelle [name] is a 81 y.o. female with a hx of diastolic CHF, CAD s/p PTCA of a large dominant RCA in 1990 and inferior STEMI August 2015, DM, HTN, HLD, COPD, former tobacco abuse.  She was admitted with an Inferior STEMI 8/4-02/21/14. Emergent cardiac catheterization demonstrated an occluded RCA with aneurysm formation at the prior angioplasty site. Aneurysm was treated with a Covered stent. Occlusion was treated with 2 drug-eluting stents (one proximal and one distal to the covered stent). There was a small pericardial effusion noted post MI. She was DC on short course of steroids and FU echo demonstrated just trivial effusion. Readmitted 8/19-8/20/15 with dyspnea that improved with one dose of IV Lasix. Echo demonstrated normal LVF and no significant pericardial effusion. Readmitted 5/10-2/58/52 with a/c diastolic CHF. Diuresis was complicated by AKI and ACEI was held. She became dyspneic on Brilinta and she was changed to Effient.     Last seen by Dr. Angelena Form 10/2014. Admitted 5/29-5/30 with chest pain. CEs remained negative.  She was seen by Cardiology and no further workup was recommended.  CP was felt to be MSK.    She returns for FU.  She is here with her daughter.  She denies any further chest pain since DC from the hospital.  She is sedentary b/c of severe knee DJD.  She denies significant dyspnea.  She denies orthopnea, PND, edema.  She denies syncope.    Studies/Reports Reviewed Today:  Echo 02/2014 - EF 60%to 65%. Wall motion was normal;  - Aortic valve: There was mild to moderate regurgitation. - Mitral valve: There was mild regurgitation. -  Left atrium: The atrium was mildly dilated. - Pericardium, extracardiac: A trivial pericardial effusion wasidentified posterior to the heart.  LHC 02/18/14 LM:  Patent. LAD:  mild disease in the mid vessel; Dx widely patent. LCx:  Ok RCA:  Mid occluded >> After revascularization, it is noted that there is a very large aneurysm, likely at the prior angioplasty site in the mid RCA, just past the occlusion. There is severe lesions just before and after the aneurysm. There is moderate disease in the remainder of the distal RCA. The posterior lateral artery is very large and patent. EF55%. LVEDP was 19 mmHg. 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 Treated with a 2.8 x 19 mm Covered stent. Distal to stent, a 2.75 x 20 mm Promus was deployed. Proximal to the covered stent, a 3.0 x 8 mm stent was deployed. Entire stented area dilated to 3.3 mm in diameter.  RECOMMENDATION: Continue dual antiplatelet therapy indefinitely.  I would continue dual antiplatelet therapy longer than usual given the covered stent.  A covered stent was used to cover the RCA aneurysm because athersclerostic disease causing MI was adjacent to the aneurysm. If the aneurysm ruptured during the procedure with conventional stent deployment, orout side of the hospital after our manipulation, the patient would have tamponade and possibly death. Therefore, we elected to cover the aneurysm.   Myoview 03/2012 Normal stress nuclear study.  LV Ejection Fraction: 74%. LV Wall Motion: NL LV Function; NL Wall  Motion   Past Medical History  Diagnosis Date  . COPD (chronic obstructive pulmonary disease)   . CAD (coronary artery disease) 1990; 2015    Cardiac cath 1990 with Dr. Lia Foyer and [name] Residual CAD, non obstructive per 2015 cath.  STEMI status post stent in August 2015.  Marland Kitchen Hyperlipidemia with target LDL less than 70   . Essential hypertension   . Diabetes mellitus type 2, insulin dependent   . AV block, 1st degree   . 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 Myocardial infarction   . Anginal pain   . Shortness of breath   . Arthritis   . Pericarditis-post MI (short course of steroids) 03/06/2014    Past Surgical History  Procedure Laterality Date  . Cholecystectomy    . Cataract extraction  2010  . Ptca  1990    PTCA of RCA  . Transthoracic echocardiogram  02/02/2012    mild LVH, EF 55-60%, Normal WM, Gr 1 DD; Mild MR  . Coronary angioplasty with stent placement  02/18/14    Promus DES to RCA  . Left heart catheterization with coronary angiogram N/A 02/18/2014    Procedure: LEFT HEART CATHETERIZATION WITH CORONARY ANGIOGRAM;  Surgeon: Jettie Booze, MD;  Location: [facility] CATH LAB;  Service: Cardiovascular;  Laterality: N/A;     Current Outpatient Prescriptions  Medication Sig Dispense Refill  . acetaminophen (TYLENOL) 325 MG tablet Take 650 mg by mouth every 6 (six) hours as needed for mild pain.    Marland Kitchen albuterol (PROAIR HFA) 108 (90 BASE) MCG/ACT inhaler Inhale 1 puff into the lungs every 4 (four) hours as needed for wheezing or shortness of breath. 18 g 5  . albuterol (PROVENTIL) (2.5 MG/3ML) 0.083% nebulizer solution Take 3 mLs (2.5 mg total) by nebulization every 6 (six) hours as needed for wheezing or shortness of breath (for wheezing). 75 mL 6  . amLODipine (NORVASC) 10 MG tablet Take 1 tablet (10 mg total) by mouth daily. 30 tablet 5  . aspirin 81 MG tablet Take 81 mg by mouth daily.     . B-D ULTRAFINE III SHORT PEN 31G X 8 MM MISC USE WITH LANTUS SOLOSTAR 100 each 11  . carvedilol (COREG) 12.5 MG tablet Take 1 tablet (12.5 mg total) by mouth 2 (two) times daily with a meal. 60 tablet 3  . CRESTOR 40 MG tablet TAKE 1 TABLET BY  MOUTH DAILY 90 tablet 0  . EFFIENT 10 MG TABS tablet TAKE 1 TABLET (10 MG TOTAL) BY MOUTH DAILY. 30 tablet 5  . fenofibrate micronized (LOFIBRA) 134 MG capsule TAKE ONE CAPSULE BY MOUTH DAILY 90 capsule 0  . ferrous sulfate 325 (65 FE) MG tablet Take 1 tablet (325 mg total) by mouth 2 (two) times daily with a meal. 60 tablet 3  . fish oil-omega-3 fatty acids 1000 MG capsule Take 2 g by mouth daily.      . Fluticasone-Salmeterol (ADVAIR DISKUS) 250-50 MCG/DOSE AEPB Inhale 1 puff into the lungs daily as needed. (Patient taking differently: Inhale 1 puff into the lungs every morning. ) 60 each 5  . folic acid (FOLVITE) 016 MCG tablet Take 400 mcg by mouth daily.      . furosemide (LASIX) 40 MG tablet  Take 1 tablet (40 mg total) by mouth daily. 90 tablet 0  . Insulin Glargine (LANTUS SOLOSTAR) 100 UNIT/ML Solostar Pen INJECT 10-15 UNITS INTO THE SKIN Daily (Patient taking differently: Inject 15 Units into the skin every morning. ) 15 mL 11  . metFORMIN (GLUCOPHAGE) 1000 MG tablet TAKE 1 TABLET BY MOUTH TWO TIMES A DAY 60 tablet 4  . metoprolol succinate (TOPROL-XL) 50 MG 24 hr tablet Take 1 tablet (50 mg total) by mouth daily. Take with or immediately following a meal. 30 tablet 5  . nitroGLYCERIN (NITROSTAT) 0.4 MG SL tablet Place 0.4 mg under the tongue every 5 (five) minutes as needed for chest pain.    . pantoprazole (PROTONIX) 40 MG tablet Take 1 tablet (40 mg total) by mouth daily. 30 tablet 6  . Potassium Chloride ER 20 MEQ TBCR TAKE 1 TABLET BY MOUTH ONCE A DAY 90 tablet 2  . rosuvastatin (CRESTOR) 40 MG tablet Take 40 mg by mouth daily.    . sertraline (ZOLOFT) 25 MG tablet Take 1 tablet (25 mg total) by mouth daily. 30 tablet 2  . sodium chloride (OCEAN) 0.65 % SOLN nasal spray Place 1 spray into both nostrils as needed for congestion.    Marland Kitchen SPIRIVA HANDIHALER 18 MCG inhalation capsule INHALE CONTENTS OF ONE CAPSULE DAILY 30 capsule 10  . traMADol (ULTRAM) 50 MG tablet Take 1-2 tablets (50-100  mg total) by mouth every 6 (six) hours as needed for moderate pain (can take 2 tabs once a day, 1 tab other times in the day). 60 tablet 5  . vitamin E 400 UNIT capsule Take 400 Units by mouth daily.       No current facility-administered medications for this visit.    Allergies:   Codeine sulfate and Morphine sulfate    Social History:  The patient  reports that she quit smoking about 8 years ago. Her smoking use included Cigarettes. She has a 55 pack-year smoking history. She has never used smokeless tobacco. She reports that she does not drink alcohol or use illicit drugs.   Family History:  The patient's family history includes Cancer (age of onset: 53) in her father; Heart attack in her son; Heart attack (age of onset: 27) in her mother.    ROS:   Please see the history of present illness.   Review of Systems  Cardiovascular: Positive for orthopnea.  Musculoskeletal: Positive for joint pain.  All other systems reviewed and are negative.     PHYSICAL EXAM: VS:  BP 138/62 mmHg  Pulse 65  Wt 177 lb (80.287 kg)    Wt Readings from Last 3 Encounters:  02/06/15 177 lb (80.287 kg)  01/23/15 177 lb (80.287 kg)  12/14/14 174 lb (78.926 kg)     GEN: Well nourished, well developed, in no acute distress HEENT: normal Neck: no JVD,  no masses Cardiac:  Normal S1/S2, RRR; no murmur ,  no rubs or gallops, no edema   Respiratory:  Decreased breath sounds bilaterally, no wheezing, rhonchi or rales. GI: soft, nontender, nondistended, + BS MS: no deformity or atrophy Skin: warm and dry  Neuro:  CNs II-XII intact, Strength and sensation are intact Psych: Normal affect   EKG:  EKG is ordered today.  It demonstrates:   NSR, HR 63, 1st degree AVB (PR 220 ms), no significant change from last tracing.   Recent Labs: 03/05/2014: Magnesium 1.5; TSH 0.936 03/15/2014: ALT 20 03/19/2014: Pro B Natriuretic peptide (BNP) 124.0* 12/14/2014: B Natriuretic Peptide  52.5; BUN 11; Creatinine, Ser  0.71; Hemoglobin 11.4*; Platelets 242; Potassium 4.1; Sodium 137    Lipid Panel    Component Value Date/Time   CHOL 147 02/19/2014 0228   TRIG 521* 02/19/2014 0228   TRIG 1693* 07/28/2006 1202   HDL 43 02/19/2014 0228   CHOLHDL 3.4 02/19/2014 0228   CHOLHDL 9.2 CALC 07/28/2006 1202   VLDL UNABLE TO CALCULATE IF TRIGLYCERIDE OVER 400 mg/dL 02/19/2014 0228   LDLCALC UNABLE TO CALCULATE IF TRIGLYCERIDE OVER 400 mg/dL 02/19/2014 0228   LDLDIRECT 29.0 09/22/2014 1500   LDLDIRECT 18.6 07/28/2006 1202      ASSESSMENT AND PLAN:  Coronary artery disease involving native coronary artery of native heart without angina pectoris:  No further chest pain.  Symptoms were atypical.  CEs during admission were neg.  No further cardiac testing needed.  Continue Amlodipine, ASA, beta-blocker, Effient, statin.  Essential hypertension:  Controlled.  Interestingly, she is on Toprol-XL and Carvedilol.  Her BP is well controlled and her HR is ok.  Continue current therapy.    Hyperlipidemia:  Continue satin.   Chronic diastolic CHF (congestive heart failure):  Volume stable. Continue current dose of Lasix.   Chronic obstructive pulmonary disease, unspecified COPD, unspecified chronic bronchitis type:  FU with PCP as directed.     Medication Changes: Current medicines are reviewed at length with the patient today.  Concerns regarding medicines are as outlined above.  The following changes have been made:   Discontinued Medications   No medications on file   Modified Medications   No medications on file   New Prescriptions   No medications on file     Labs/ tests ordered today include:   Orders Placed This Encounter  Procedures  . EKG 12-Lead     Disposition:   FU with Dr. Lauree Chandler 3 mos.    Signed, Versie Starks, MHS 02/06/2015 12:29 PM    [address], [place], [place]  [id number] Phone: [phone]; Fax: [phone]

## 2015-03-13 DIAGNOSIS — Z08 Encounter for follow-up examination after completed treatment for malignant neoplasm: Secondary | ICD-10-CM | POA: Diagnosis not present

## 2015-03-13 DIAGNOSIS — Z85828 Personal history of other malignant neoplasm of skin: Secondary | ICD-10-CM | POA: Diagnosis not present

## 2015-03-22 ENCOUNTER — Other Ambulatory Visit: Payer: Self-pay | Admitting: Family Medicine

## 2015-04-03 ENCOUNTER — Ambulatory Visit (INDEPENDENT_AMBULATORY_CARE_PROVIDER_SITE_OTHER): Payer: Commercial Managed Care - HMO | Admitting: Family Medicine

## 2015-04-03 ENCOUNTER — Encounter: Payer: Self-pay | Admitting: Family Medicine

## 2015-04-03 VITALS — BP 124/74 | HR 73 | Wt 180.0 lb

## 2015-04-03 DIAGNOSIS — I10 Essential (primary) hypertension: Secondary | ICD-10-CM | POA: Diagnosis not present

## 2015-04-03 DIAGNOSIS — I25118 Atherosclerotic heart disease of native coronary artery with other forms of angina pectoris: Secondary | ICD-10-CM

## 2015-04-03 DIAGNOSIS — Z23 Encounter for immunization: Secondary | ICD-10-CM | POA: Diagnosis not present

## 2015-04-03 DIAGNOSIS — E119 Type 2 diabetes mellitus without complications: Secondary | ICD-10-CM | POA: Diagnosis not present

## 2015-04-03 DIAGNOSIS — M170 Bilateral primary osteoarthritis of knee: Secondary | ICD-10-CM

## 2015-04-03 DIAGNOSIS — Z794 Long term (current) use of insulin: Secondary | ICD-10-CM

## 2015-04-03 LAB — COMPREHENSIVE METABOLIC PANEL
ALT: 14 U/L (ref 6–29)
AST: 18 U/L (ref 10–35)
Albumin: 4.4 g/dL (ref 3.6–5.1)
Alkaline Phosphatase: 73 U/L (ref 33–130)
BUN: 18 mg/dL (ref 7–25)
CHLORIDE: 99 mmol/L (ref 98–110)
CO2: 27 mmol/L (ref 20–31)
Calcium: 9.5 mg/dL (ref 8.6–10.4)
Creat: 0.9 mg/dL — ABNORMAL HIGH (ref 0.60–0.88)
Glucose, Bld: 140 mg/dL — ABNORMAL HIGH (ref 65–99)
POTASSIUM: 4.7 mmol/L (ref 3.5–5.3)
Sodium: 139 mmol/L (ref 135–146)
Total Bilirubin: 0.5 mg/dL (ref 0.2–1.2)
Total Protein: 6.9 g/dL (ref 6.1–8.1)

## 2015-04-03 MED ORDER — TRAMADOL HCL 50 MG PO TABS: 50.0000 mg | ORAL_TABLET | Freq: Two times a day (BID) | ORAL | Status: DC | PRN

## 2015-04-03 MED ORDER — INSULIN GLARGINE 100 UNIT/ML SOLOSTAR PEN: 17.0000 [IU] | PEN_INJECTOR | SUBCUTANEOUS | Status: DC

## 2015-04-03 NOTE — Assessment & Plan Note (Signed)
S: stable with nitro use about once a month that relieves pain. Compliant with ASA, effient, crestor A/P: Stable angina- continue current medicines

## 2015-04-03 NOTE — Assessment & Plan Note (Signed)
S: controlled. On Enalapril 20mg  BID, Lasix 20mg , metoprolol 100mg  XL, Amlodipine 10mg  BP Readings from Last 3 Encounters:  04/03/15 124/74  02/06/15 138/62  01/23/15 130/64  A/P:Continue current meds

## 2015-04-03 NOTE — Assessment & Plan Note (Signed)
S: poor control of pain off tramadol. Reasonable with 2 in am and 1 in PM A/P: we discussed continuing current dose but using sparing 2 in PM in stead of 1. Increased rx to #90 to last her closer to a month.

## 2015-04-03 NOTE — Assessment & Plan Note (Signed)
S:Lantus up to 16 last visit from 15 from fastings 140s-150s. This visit 143-163 over last week and similar to last few weeks Lab Results  Component Value Date   HGBA1C 7.6* 12/14/2014  A/P: check a1c today, suspect mild poor control. Try to get fasting CBGS between 100-130 by increasing to 18 units Lantus. See avs. 3 months f/u.

## 2015-04-03 NOTE — Progress Notes (Signed)
[name], MD  Subjective:  Michelle [name] is a 81 y.o. year old very pleasant female patient who presents for/with See problem oriented charting ROS- occasional chest pain resolved by nitro, states no shortness of breath. No headache or blurry vision. Does have knee pain bilaterally severe  Past Medical History-  Patient Active Problem List   Diagnosis Date Noted  . Chronic diastolic CHF (congestive heart failure) 03/13/2014    Priority: High  . CAD- RCA PCI '90s, STEMI-RCA DES 02/18/14 03/05/2014    Priority: High  . IDDM type 2 03/26/2007    Priority: High  . Anxiety state 04/21/2014    Priority: Medium  . SOB (shortness of breath) 03/05/2014    Priority: Medium  . Hyperlipidemia with target LDL less than 70 03/26/2007    Priority: Medium  . Essential hypertension 04/21/2006    Priority: Medium  . COPD (chronic obstructive pulmonary disease) 04/21/2006    Priority: Medium  . CKD (chronic kidney disease), stage II 07/25/2014    Priority: Low  . Osteoarthritis, knee 05/13/2014    Priority: Low  . Back pain, lumbosacral 10/10/2012    Priority: Low  . Chest pain 12/14/2014    Medications- reviewed and updated Current Outpatient Prescriptions  Medication Sig Dispense Refill  . amLODipine (NORVASC) 10 MG tablet Take 1 tablet (10 mg total) by mouth daily. 30 tablet 5  . aspirin 81 MG tablet Take 81 mg by mouth daily.     . B-D ULTRAFINE III SHORT PEN 31G X 8 MM MISC USE WITH LANTUS SOLOSTAR 100 each 11  . carvedilol (COREG) 12.5 MG tablet Take 1 tablet (12.5 mg total) by mouth 2 (two) times daily with a meal. 60 tablet 3  . EFFIENT 10 MG TABS tablet TAKE 1 TABLET (10 MG TOTAL) BY MOUTH DAILY. 30 tablet 5  . enalapril (VASOTEC) 20 MG tablet TAKE 1 TABLET (20 MG TOTAL) BY MOUTH 2 (TWO) TIMES DAILY. 60 tablet 4  . fenofibrate micronized (LOFIBRA) 134 MG capsule TAKE ONE CAPSULE BY MOUTH DAILY 90 capsule 0  . ferrous sulfate 325 (65 FE) MG tablet Take 1 tablet (325 mg total)  by mouth 2 (two) times daily with a meal. 60 tablet 3  . fish oil-omega-3 fatty acids 1000 MG capsule Take 2 g by mouth daily.      . Fluticasone-Salmeterol (ADVAIR DISKUS) 250-50 MCG/DOSE AEPB Inhale 1 puff into the lungs daily as needed. (Patient taking differently: Inhale 1 puff into the lungs every morning. ) 60 each 5  . folic acid (FOLVITE) 932 MCG tablet Take 400 mcg by mouth daily.      . furosemide (LASIX) 40 MG tablet Take 1 tablet (40 mg total) by mouth daily. 90 tablet 0  . Insulin Glargine (LANTUS SOLOSTAR) 100 UNIT/ML Solostar Pen Inject 17-20 Units into the skin every morning. 15 mL 11  . metFORMIN (GLUCOPHAGE) 1000 MG tablet TAKE 1 TABLET BY MOUTH TWO TIMES A DAY 60 tablet 5  . metoprolol succinate (TOPROL-XL) 50 MG 24 hr tablet Take 1 tablet (50 mg total) by mouth daily. Take with or immediately following a meal. 30 tablet 5  . pantoprazole (PROTONIX) 40 MG tablet Take 1 tablet (40 mg total) by mouth daily. 30 tablet 6  . Potassium Chloride ER 20 MEQ TBCR TAKE 1 TABLET BY MOUTH ONCE A DAY 90 tablet 2  . rosuvastatin (CRESTOR) 40 MG tablet Take 40 mg by mouth daily.    . sertraline (ZOLOFT) 25 MG tablet Take  1 tablet (25 mg total) by mouth daily. 30 tablet 2  . vitamin E 400 UNIT capsule Take 400 Units by mouth daily.      Marland Kitchen acetaminophen (TYLENOL) 325 MG tablet Take 650 mg by mouth every 6 (six) hours as needed for mild pain.    Marland Kitchen albuterol (PROAIR HFA) 108 (90 BASE) MCG/ACT inhaler Inhale 1 puff into the lungs every 4 (four) hours as needed for wheezing or shortness of breath. (Patient not taking: Reported on 04/03/2015) 18 g 5  . albuterol (PROVENTIL) (2.5 MG/3ML) 0.083% nebulizer solution Take 3 mLs (2.5 mg total) by nebulization every 6 (six) hours as needed for wheezing or shortness of breath (for wheezing). (Patient not taking: Reported on 04/03/2015) 75 mL 6  . nitroGLYCERIN (NITROSTAT) 0.4 MG SL tablet Place 0.4 mg under the tongue every 5 (five) minutes as needed for chest  pain.    . sodium chloride (OCEAN) 0.65 % SOLN nasal spray Place 1 spray into both nostrils as needed for congestion.    Marland Kitchen SPIRIVA HANDIHALER 18 MCG inhalation capsule INHALE CONTENTS OF ONE CAPSULE DAILY (Patient not taking: Reported on 04/03/2015) 30 capsule 10  . traMADol (ULTRAM) 50 MG tablet Take 1-2 tablets (50-100 mg total) by mouth 2 (two) times daily as needed for moderate pain (can take 2 tabs once a day, 1-2 tabs in PM). 90 tablet 3   No current facility-administered medications for this visit.    Objective: BP 124/74 mmHg  Pulse 73  Wt 180 lb (81.647 kg)  SpO2 95% Gen: NAD, resting comfortably Mucous membranes are moist. CV: RRR no murmurs rubs or gallops Lungs: CTAB no crackles, wheeze, rhonchi Abdomen: soft/nontender/nondistended/normal bowel sounds. No rebound or guarding.  Ext: no edema Skin: warm, dry Neuro: sits in wheelchair, moves all extremities  Diabetic Foot Exam - Simple   Simple Foot Form  Diabetic Foot exam was performed with the following findings:  Yes 04/03/2015  6:39 PM  Visual Inspection  No deformities, no ulcerations, no other skin breakdown bilaterally:  Yes  Sensation Testing  Intact to touch and monofilament testing bilaterally:  Yes  Pulse Check  Posterior Tibialis and Dorsalis pulse intact bilaterally:  Yes  Comments     Assessment/Plan:  IDDM type 2 S:Lantus up to 16 last visit from 15 from fastings 140s-150s. This visit 143-163 over last week and similar to last few weeks Lab Results  Component Value Date   HGBA1C 7.6* 12/14/2014  A/P: check a1c today, suspect mild poor control. Try to get fasting CBGS between 100-130 by increasing to 18 units Lantus. See avs. 3 months f/u.    Osteoarthritis, knee S: poor control of pain off tramadol. Reasonable with 2 in am and 1 in PM A/P: we discussed continuing current dose but using sparing 2 in PM in stead of 1. Increased rx to #90 to last her closer to a month.    Essential hypertension S:  controlled. On Enalapril 20mg  BID, Lasix 20mg , metoprolol 100mg  XL, Amlodipine 10mg  BP Readings from Last 3 Encounters:  04/03/15 124/74  02/06/15 138/62  01/23/15 130/64  A/P:Continue current meds   CAD- RCA PCI '90s, STEMI-RCA DES 02/18/14 S: stable with nitro use about once a month that relieves pain. Compliant with ASA, effient, crestor A/P: Stable angina- continue current medicines   Return precautions advised.   Orders Placed This Encounter  Procedures  . Flu Vaccine QUAD 36+ mos IM  . Hemoglobin A1c  . Comprehensive metabolic panel    Meds  ordered this encounter  Medications  . Insulin Glargine (LANTUS SOLOSTAR) 100 UNIT/ML Solostar Pen    Sig: Inject 17-20 Units into the skin every morning.    Dispense:  15 mL    Refill:  11  . traMADol (ULTRAM) 50 MG tablet    Sig: Take 1-2 tablets (50-100 mg total) by mouth 2 (two) times daily as needed for moderate pain (can take 2 tabs once a day, 1-2 tabs in PM).    Dispense:  90 tablet    Refill:  3   Health Maintenance Due  Topic Date Due  . DEXA SCAN - declined 12/31/1999  . OPHTHALMOLOGY EXAM - advised 11/16/2014  . FOOT EXAM -normal 01/09/2015  . INFLUENZA VACCINE -today 02/16/2015

## 2015-04-03 NOTE — Patient Instructions (Addendum)
Increase lantus to 17 units for next week, as long as no blood sugar < 110, then increase to 18 units.   A1c before you leave  Thanks for getting your flu shot.   Can use 2 tramadol in morning and 1 in evening (at least 6 hours after first dose). Use 2 pills in evening only on really bad days  Let's check in 3 months and a day  from now

## 2015-04-04 LAB — HEMOGLOBIN A1C
Hgb A1c MFr Bld: 7.3 % — ABNORMAL HIGH (ref <5.7)
MEAN PLASMA GLUCOSE: 163 mg/dL — AB (ref <117)

## 2015-04-07 ENCOUNTER — Other Ambulatory Visit: Payer: Self-pay | Admitting: Family Medicine

## 2015-04-10 ENCOUNTER — Other Ambulatory Visit: Payer: Self-pay

## 2015-04-10 MED ORDER — FUROSEMIDE 40 MG PO TABS: 40.0000 mg | ORAL_TABLET | Freq: Every day | ORAL | Status: DC

## 2015-04-17 ENCOUNTER — Other Ambulatory Visit: Payer: Self-pay | Admitting: Internal Medicine

## 2015-04-27 ENCOUNTER — Other Ambulatory Visit: Payer: Self-pay | Admitting: Family Medicine

## 2015-05-04 ENCOUNTER — Other Ambulatory Visit: Payer: Self-pay | Admitting: Family Medicine

## 2015-05-10 ENCOUNTER — Other Ambulatory Visit: Payer: Self-pay | Admitting: Family Medicine

## 2015-05-11 MED ORDER — FLUTICASONE-SALMETEROL 250-50 MCG/DOSE IN AEPB: 1.0000 | INHALATION_SPRAY | Freq: Every day | RESPIRATORY_TRACT | Status: DC | PRN

## 2015-05-11 NOTE — Telephone Encounter (Signed)
Yes thanks may refill but she has an advair diskus on active med list- refill that version

## 2015-05-11 NOTE — Telephone Encounter (Signed)
Patient requested a refill for Advair. This medication is currently on the patient's historical med list. Ok to refill?

## 2015-05-11 NOTE — Telephone Encounter (Signed)
Refilled current version of Advair. Thanks!

## 2015-05-21 NOTE — Progress Notes (Signed)
Chief Complaint  Patient presents with  . Follow-up    3 month     History of Present Illness: 81 y.o. female with a hx of diastolic CHF, CAD s/p PTCA of a large dominant RCA in 1990 and inferior STEMI August 2015, DM, HTN, HLD, COPD, former tobacco abuse here today for cardiac follow up. She was admitted with an Inferior STEMI 8/4-02/21/14. Emergent cardiac catheterization demonstrated an occluded RCA with aneurysm formation at the prior angioplasty site. Aneurysm was treated with a Covered stent. Occlusion was treated with 2 drug-eluting stents (one proximal and one distal to the covered stent). There was a small pericardial effusion noted post MI. She was DC on short course of steroids and FU echo demonstrated just trivial effusion. Readmitted 8/19-8/20/15 with dyspnea that improved with one dose of IV Lasix. Echo demonstrated normal LVF and no significant pericardial effusion. Readmitted 2/09-4/70/96 with a/c diastolic CHF. Diuresis was complicated by AKI and ACEI was held. She became dyspneic on Brilinta and she was changed to Effient. She was admitted to [facility] May 2016 with chest pain and ruled out for MI with serial cardiac markers. She was seen by Cardiology and her pain was felt to be musculoskeletal.   She is here today for follow up. She denies chest discomfort with exertion.  She ambulates with a walker. She has no energy. No SOB. Weight stable at home.   Primary Care Physician: [name]  Past Medical History  Diagnosis Date  . COPD (chronic obstructive pulmonary disease) ([place])   . CAD (coronary artery disease) 1990; 2015    Cardiac cath 1990 with Dr. Lia Foyer and [name] Residual CAD, non obstructive per 2015 cath. STEMI status post stent in August 2015.  Marland Kitchen Hyperlipidemia with target LDL less than 70   . Essential  hypertension   . Diabetes mellitus type 2, insulin dependent ([place])   . AV block, 1st degree   . 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 Myocardial infarction ([place])   . Anginal pain ([place])   . Shortness of breath   . Arthritis   . Pericarditis-post MI (short course of steroids) 03/06/2014    Past Surgical History  Procedure Laterality Date  . Cholecystectomy    . Cataract extraction  2010  . Ptca  1990    PTCA of RCA  . Transthoracic echocardiogram  02/02/2012    mild LVH, EF 55-60%, Normal WM, Gr 1 DD; Mild MR  . Coronary angioplasty with stent placement  02/18/14    Promus DES to RCA  . Left heart catheterization with coronary angiogram N/A 02/18/2014    Procedure: LEFT HEART CATHETERIZATION WITH CORONARY ANGIOGRAM;  Surgeon: Jettie Booze, MD;  Location: [facility] CATH LAB;  Service: Cardiovascular;  Laterality: N/A;    Current Outpatient Prescriptions  Medication Sig Dispense Refill  . acetaminophen (TYLENOL) 325 MG tablet Take 650 mg by mouth every 6 (six) hours as needed for mild pain.    Marland Kitchen albuterol (PROVENTIL) (2.5 MG/3ML) 0.083% nebulizer solution TAKE 3 MLS (2.5 MG TOTAL) BY NEBULIZATION EVERY 6 (SIX) HOURS AS NEEDED FOR WHEEZING OR SHORTNESS OF BREATH 75 mL 5  . amLODipine (NORVASC) 10 MG tablet TAKE 1 TABLET (10 MG TOTAL) BY MOUTH DAILY. 30 tablet  4  . aspirin 81 MG tablet Take 81 mg by mouth daily.     . B-D ULTRAFINE III SHORT PEN 31G X 8 MM MISC USE WITH LANTUS SOLOSTAR 100 each 11  . carvedilol (COREG) 12.5 MG tablet TAKE 1 TABLET (12.5 MG TOTAL) BY MOUTH 2 (TWO) TIMES DAILY WITH A MEAL. 60 tablet 5  . CRESTOR 40 MG tablet TAKE 1 TABLET BY MOUTH DAILY 90 tablet 3  . enalapril (VASOTEC) 20 MG tablet TAKE 1 TABLET (20 MG TOTAL) BY MOUTH 2 (TWO) TIMES DAILY. 60 tablet 4  . fenofibrate micronized (LOFIBRA) 134 MG capsule TAKE ONE CAPSULE BY MOUTH DAILY 90 capsule 0  . ferrous sulfate 325 (65 FE) MG  tablet Take 1 tablet (325 mg total) by mouth 2 (two) times daily with a meal. 60 tablet 3  . fish oil-omega-3 fatty acids 1000 MG capsule Take 2 g by mouth daily.      . Fluticasone-Salmeterol (ADVAIR DISKUS) 250-50 MCG/DOSE AEPB Inhale 1 puff into the lungs daily as needed. 60 each 5  . folic acid (FOLVITE) 193 MCG tablet Take 400 mcg by mouth daily.      . furosemide (LASIX) 40 MG tablet Take 1 tablet (40 mg total) by mouth daily. 90 tablet 3  . Insulin Glargine (LANTUS SOLOSTAR) 100 UNIT/ML Solostar Pen Inject 17-20 Units into the skin every morning. 15 mL 11  . metFORMIN (GLUCOPHAGE) 1000 MG tablet TAKE 1 TABLET BY MOUTH TWO TIMES A DAY 60 tablet 5  . nitroGLYCERIN (NITROSTAT) 0.4 MG SL tablet Place 0.4 mg under the tongue every 5 (five) minutes as needed for chest pain (x 3 pills daily).     . pantoprazole (PROTONIX) 40 MG tablet Take 1 tablet (40 mg total) by mouth daily. 30 tablet 6  . Potassium Chloride ER 20 MEQ TBCR TAKE 1 TABLET BY MOUTH ONCE A DAY 90 tablet 2  . rosuvastatin (CRESTOR) 40 MG tablet Take 40 mg by mouth daily.    . sertraline (ZOLOFT) 25 MG tablet TAKE 1 TABLET (25 MG TOTAL) BY MOUTH DAILY. 30 tablet 5  . sodium chloride (OCEAN) 0.65 % SOLN nasal spray Place 1 spray into both nostrils as needed for congestion.    . traMADol (ULTRAM) 50 MG tablet Take 1-2 tablets (50-100 mg total) by mouth 2 (two) times daily as needed for moderate pain (can take 2 tabs once a day, 1-2 tabs in PM). 90 tablet 3  . vitamin E 400 UNIT capsule Take 400 Units by mouth daily.      . clopidogrel (PLAVIX) 75 MG tablet Take 1 tablet (75 mg total) by mouth daily. 30 tablet 11   No current facility-administered medications for this visit.    Allergies  Allergen Reactions  . Codeine Sulfate Nausea Only  . Morphine Sulfate Nausea Only    Social History   Social History  . Marital Status: Married    Spouse Name: N/A  . Number of Children: 2  . Years of Education: N/A   Occupational  History  . Retired    Social History Main Topics  . Smoking status: Former Smoker -- 1.00 packs/day for 55 years    Types: Cigarettes    Quit date: 09/29/2006  . Smokeless tobacco: Never Used  . Alcohol Use: No  . Drug Use: No  . Sexual Activity: Not on file   Other Topics Concern  . Not on file   Social History Narrative   Lives with her son but he  works all day. Home for dinner.  Independent ADL. Needs assist on some IADL.     Family History  Problem Relation Age of Onset  . Heart attack Mother 64  . Cancer Father 73  . Heart attack Son     Review of Systems:  As stated in the HPI and otherwise negative.   BP 124/68 mmHg  Pulse 69  Ht 5\' 5"  (1.651 m)  Wt 184 lb 3.2 oz (83.553 kg)  BMI 30.65 kg/m2  Physical Examination: General: Well developed, well nourished, NAD HEENT: OP clear, mucus membranes moist SKIN: warm, dry. No rashes. Neuro: No focal deficits Musculoskeletal: Muscle strength 5/5 all ext Psychiatric: Mood and affect normal Neck: No JVD, no carotid bruits, no thyromegaly, no lymphadenopathy. Lungs:Clear bilaterally, no wheezes, rhonci, crackles Cardiovascular: Regular rate and rhythm. Soft systolic murmurs, gallops or rubs. Abdomen:Soft. Bowel sounds present. Non-tender.  Extremities: No lower extremity edema. Pulses are 2 + in the bilateral DP/PT.  EKG:  EKG is not ordered today. The ekg ordered today demonstrates  Recent Labs: 12/14/2014: B Natriuretic Peptide 52.5; Hemoglobin 11.4*; Platelets 242 04/03/2015: ALT 14; BUN 18; Creat 0.90*; Potassium 4.7; Sodium 139   Lipid Panel    Component Value Date/Time   CHOL 147 02/19/2014 0228   TRIG 521* 02/19/2014 0228   TRIG 1693* 07/28/2006 1202   HDL 43 02/19/2014 0228   CHOLHDL 3.4 02/19/2014 0228   CHOLHDL 9.2 CALC 07/28/2006 1202   VLDL UNABLE TO CALCULATE IF TRIGLYCERIDE OVER 400 mg/dL 02/19/2014 0228   LDLCALC UNABLE TO CALCULATE IF TRIGLYCERIDE OVER 400 mg/dL 02/19/2014 0228   LDLDIRECT 29.0  09/22/2014 1500   LDLDIRECT 18.6 07/28/2006 1202     Wt Readings from Last 3 Encounters:  05/22/15 184 lb 3.2 oz (83.553 kg)  04/03/15 180 lb (81.647 kg)  02/06/15 177 lb (80.287 kg)     Other studies Reviewed: Additional studies/ records that were reviewed today include:. Review of the above records demonstrates:    Assessment and Plan:   1. CAD: Stable.Continue ASA, statin and beta blocker. Will change Effient to Plavix. Stop Toprol since she is also on Coreg.   3. HTN: BP stable. No changes.   4. Hyperlipidemia: On statin. LDL at goal.   5. Chronic diastolic CHF: volume status is ok. Continue Lasix.    Current medicines are reviewed at length with the patient today.  The patient does not have concerns regarding medicines.  The following changes have been made:  no change  Labs/ tests ordered today include: No orders of the defined types were placed in this encounter.    Disposition:   FU with me in 6 months  Signed, Lauree Chandler, MD 05/22/2015 9:53 AM    [facility] [place], [place], [place]  [address] Phone: [phone]; Fax: [phone]

## 2015-05-22 ENCOUNTER — Encounter: Payer: Self-pay | Admitting: Cardiovascular Disease

## 2015-05-22 ENCOUNTER — Ambulatory Visit (INDEPENDENT_AMBULATORY_CARE_PROVIDER_SITE_OTHER): Payer: Commercial Managed Care - HMO | Admitting: Cardiovascular Disease

## 2015-05-22 VITALS — BP 124/68 | HR 69 | Ht 65.0 in | Wt 184.2 lb

## 2015-05-22 DIAGNOSIS — I10 Essential (primary) hypertension: Secondary | ICD-10-CM

## 2015-05-22 DIAGNOSIS — E785 Hyperlipidemia, unspecified: Secondary | ICD-10-CM

## 2015-05-22 DIAGNOSIS — I5032 Chronic diastolic (congestive) heart failure: Secondary | ICD-10-CM

## 2015-05-22 DIAGNOSIS — I2510 Atherosclerotic heart disease of native coronary artery without angina pectoris: Secondary | ICD-10-CM

## 2015-05-22 MED ORDER — CLOPIDOGREL BISULFATE 75 MG PO TABS: 75.0000 mg | ORAL_TABLET | Freq: Every day | ORAL | Status: DC

## 2015-05-22 NOTE — Patient Instructions (Signed)
Medication Instructions:  Your physician has recommended you make the following change in your medication:  Stop Toprol (metoprolol succinate) Stop Effient.  Start Clopidogrel 75 mg by mouth daily.    Labwork: none  Testing/Procedures: none  Follow-Up: Your physician wants you to follow-up in: 6 months.  You will receive a reminder letter in the mail two months in advance. If you don't receive a letter, please call our office to schedule the follow-up appointment.   Any Other Special Instructions Will Be Listed Below (If Applicable).     If you need a refill on your cardiac medications before your next appointment, please call your pharmacy.

## 2015-06-08 ENCOUNTER — Other Ambulatory Visit: Payer: Self-pay | Admitting: Cardiology

## 2015-06-13 ENCOUNTER — Other Ambulatory Visit: Payer: Self-pay | Admitting: Cardiology

## 2015-06-26 ENCOUNTER — Other Ambulatory Visit: Payer: Self-pay | Admitting: *Deleted

## 2015-06-26 NOTE — Patient Outreach (Signed)
Telephone call to follow up on pt. She states she is doing well. She stays in the bed quite a lot because she has had a number of falls and when she is by herself, when her son is out, she'd just rather stay in bed. She reports no recent falls. She continues to weigh daily and her wt is stable at 180. She continues to check her glucose and her control is good. She saw her primary MD, Dr. Yong Channel on 04/03/15 and had a flu vaccine. She is to see Dr.Hunter again this month. She saw her cardiologist Dr. Angelena Form on 05/22/15 and was advised to continue all meds the same way.   I was previously active with this pt before we started using Epic and then again for transition of care calls again last summer. I had mentioned trasitioning her to telephonic but I see I must not have made the referral. Michelle [name] agrees to particpate with a health coach. She really enjoys the conversation and company. She does not have any community care needs at this time.  Michelle [name] [facility] [place] [phone]

## 2015-06-29 ENCOUNTER — Other Ambulatory Visit: Payer: Self-pay | Admitting: *Deleted

## 2015-06-29 DIAGNOSIS — I509 Heart failure, unspecified: Secondary | ICD-10-CM

## 2015-06-30 ENCOUNTER — Other Ambulatory Visit: Payer: Self-pay

## 2015-06-30 NOTE — Patient Outreach (Signed)
[facility]) Care Management  06/30/2015  Michelle [name] [birth year] [id number]  Telephone call to patient for introduction call.  Patient reports she is doing ok. She reports a fall yesterday after her shower door moved away from her. She denies any injury. Discussed with patient fall precautions.  She verbalized understanding.    Plan: RN Health Coach will contact patient within one month and patient agrees to next outreach.    Jone Baseman, RN, MSN [place] [phone]

## 2015-07-03 ENCOUNTER — Encounter: Payer: Self-pay | Admitting: Family Medicine

## 2015-07-03 ENCOUNTER — Ambulatory Visit (INDEPENDENT_AMBULATORY_CARE_PROVIDER_SITE_OTHER): Payer: Commercial Managed Care - HMO | Admitting: Family Medicine

## 2015-07-03 VITALS — BP 138/68 | HR 98 | Temp 97.7°F

## 2015-07-03 DIAGNOSIS — M545 Low back pain, unspecified: Secondary | ICD-10-CM

## 2015-07-03 DIAGNOSIS — M170 Bilateral primary osteoarthritis of knee: Secondary | ICD-10-CM

## 2015-07-03 DIAGNOSIS — I10 Essential (primary) hypertension: Secondary | ICD-10-CM | POA: Diagnosis not present

## 2015-07-03 DIAGNOSIS — E119 Type 2 diabetes mellitus without complications: Secondary | ICD-10-CM

## 2015-07-03 DIAGNOSIS — Z794 Long term (current) use of insulin: Secondary | ICD-10-CM

## 2015-07-03 DIAGNOSIS — M5489 Other dorsalgia: Secondary | ICD-10-CM

## 2015-07-03 MED ORDER — TRAMADOL HCL 50 MG PO TABS: 50.0000 mg | ORAL_TABLET | Freq: Two times a day (BID) | ORAL | Status: DC | PRN

## 2015-07-03 NOTE — Progress Notes (Signed)
[name], MD  Subjective:  Michelle [name] is a 81 y.o. year old very pleasant female patient who presents for/with See problem oriented charting ROS- baseline shortness of breath, no recent chest pain. No edema. No blurry vision. Chronic knee nad low back pain without recent worsening  Past Medical History-  Patient Active Problem List   Diagnosis Date Noted  . Chronic diastolic CHF (congestive heart failure) ([place]) 03/13/2014    Priority: High  . CAD- RCA PCI '90s, STEMI-RCA DES 02/18/14 03/05/2014    Priority: High  . IDDM type 2 03/26/2007    Priority: High  . Anxiety state 04/21/2014    Priority: Medium  . SOB (shortness of breath) 03/05/2014    Priority: Medium  . Hyperlipidemia with target LDL less than 70 03/26/2007    Priority: Medium  . Essential hypertension 04/21/2006    Priority: Medium  . COPD (chronic obstructive pulmonary disease) ([place]) 04/21/2006    Priority: Medium  . CKD (chronic kidney disease), stage II 07/25/2014    Priority: Low  . Osteoarthritis, knee 05/13/2014    Priority: Low  . Back pain, lumbosacral 10/10/2012    Priority: Low  . Chest pain 12/14/2014    Medications- reviewed and updated Current Outpatient Prescriptions  Medication Sig Dispense Refill  . amLODipine (NORVASC) 10 MG tablet TAKE 1 TABLET (10 MG TOTAL) BY MOUTH DAILY. 30 tablet 4  . aspirin 81 MG tablet Take 81 mg by mouth daily.     . B-D ULTRAFINE III SHORT PEN 31G X 8 MM MISC USE WITH LANTUS SOLOSTAR 100 each 11  . carvedilol (COREG) 12.5 MG tablet TAKE 1 TABLET (12.5 MG TOTAL) BY MOUTH 2 (TWO) TIMES DAILY WITH A MEAL. 60 tablet 5  . clopidogrel (PLAVIX) 75 MG tablet Take 1 tablet (75 mg total) by mouth daily. 30 tablet 11  . CRESTOR 40 MG tablet TAKE 1 TABLET BY MOUTH DAILY 90 tablet 3  . enalapril (VASOTEC) 20 MG tablet TAKE 1 TABLET (20 MG TOTAL) BY MOUTH 2 (TWO) TIMES DAILY. 60 tablet 4  . fenofibrate micronized (LOFIBRA) 134 MG capsule TAKE ONE CAPSULE BY MOUTH DAILY 90  capsule 0  . ferrous sulfate 325 (65 FE) MG tablet Take 1 tablet (325 mg total) by mouth 2 (two) times daily with a meal. 60 tablet 3  . fish oil-omega-3 fatty acids 1000 MG capsule Take 2 g by mouth daily.      . folic acid (FOLVITE) A999333 MCG tablet Take 400 mcg by mouth daily.      . furosemide (LASIX) 40 MG tablet Take 1 tablet (40 mg total) by mouth daily. 90 tablet 3  . Insulin Glargine (LANTUS SOLOSTAR) 100 UNIT/ML Solostar Pen Inject 17-20 Units into the skin every morning. 15 mL 11  . metFORMIN (GLUCOPHAGE) 1000 MG tablet TAKE 1 TABLET BY MOUTH TWO TIMES A DAY 60 tablet 5  . pantoprazole (PROTONIX) 40 MG tablet TAKE 1 TABLET (40 MG TOTAL) BY MOUTH DAILY. 30 tablet 6  . Potassium Chloride ER 20 MEQ TBCR TAKE 1 TABLET BY MOUTH ONCE A DAY 90 tablet 2  . rosuvastatin (CRESTOR) 40 MG tablet Take 40 mg by mouth daily.    . sertraline (ZOLOFT) 25 MG tablet TAKE 1 TABLET (25 MG TOTAL) BY MOUTH DAILY. 30 tablet 5  . sodium chloride (OCEAN) 0.65 % SOLN nasal spray Place 1 spray into both nostrils as needed for congestion.    . vitamin E 400 UNIT capsule Take 400 Units by  mouth daily.      Marland Kitchen acetaminophen (TYLENOL) 325 MG tablet Take 650 mg by mouth every 6 (six) hours as needed for mild pain. Reported on 07/03/2015    . albuterol (PROVENTIL) (2.5 MG/3ML) 0.083% nebulizer solution TAKE 3 MLS (2.5 MG TOTAL) BY NEBULIZATION EVERY 6 (SIX) HOURS AS NEEDED FOR WHEEZING OR SHORTNESS OF BREATH (Patient not taking: Reported on 07/03/2015) 75 mL 5  . Fluticasone-Salmeterol (ADVAIR DISKUS) 250-50 MCG/DOSE AEPB Inhale 1 puff into the lungs daily as needed. (Patient not taking: Reported on 07/03/2015) 60 each 5  . NITROSTAT 0.4 MG SL tablet PLACE 1 TABLET (0.4 MG TOTAL) UNDER THE TONGUE EVERY 5 (FIVE) MINUTES AS NEEDED FOR CHEST PAIN (CHEST PAIN OR SHORTNESS OF BREATH). (Patient not taking: Reported on 07/03/2015) 25 tablet 4  . traMADol (ULTRAM) 50 MG tablet Take 1-2 tablets (50-100 mg total) by mouth 2 (two)  times daily as needed for moderate pain (can take 2 tabs once a day, 1-2 tabs in PM). 90 tablet 2   No current facility-administered medications for this visit.    Objective: BP 138/68 mmHg  Pulse 98  Temp(Src) 97.7 F (36.5 C)  Wt  Gen: NAD, resting comfortably in wheelchair CV: RRR no murmurs rubs or gallops Lungs: CTAB no crackles, wheeze, rhonchi Abdomen: soft/nontender/nondistended/normal bowel sounds. No rebound or guarding.  Ext: no edema Knee: right medial knee pain with palpation Skin: warm, dry Neuro: grossly normal, moves all extremities Assessment/Plan:  IDDM type 2 S: reasonably controlled. On lantus 18 units, metformin 2g total daily.  CBGs- fasting on 18 units 109-147 last 20 days. Had been 143-163 on 16 units previously Exercise and diet- poor for both, activity low- will try PT Lab Results  Component Value Date   HGBA1C 7.3* 04/03/2015   HGBA1C 7.6* 12/14/2014   HGBA1C 7.4* 09/22/2014   A/P: continue current medicine, return for a1c  Osteoarthritis, knee Low back pain S: continues Tramadol twice a day mainly (2 in AM, 1 in PM). Injections have not helped, not interested in surgery. She did have a fall in the shower the other day due to this pain in combination with back pain. No worsening pain after fall.  A/P: At this point, will refer to PT and OT to see if we can help her with mobility. OT to see if can help with transfers such as to shower. Refilled tramadol.    Essential hypertension S: controlled. On Enalapril 20mg  BID, Lasix 20mg , coreg 12.5mg  BID, Amlodipine 10mg  on repeat today.   BP Readings from Last 3 Encounters:  07/03/15 138/68  05/22/15 124/68  04/03/15 124/74  A/P:Continue current meds. Trended up slightly- will watch.    Return precautions advised.   a1c would be 3 months today exactly, will return as insurance may not cover testing. Then see me 3 months 1 day after that.  Orders Placed This Encounter  Procedures  . Basic Metabolic  Panel    Standing Status: Future     Number of Occurrences:      Standing Expiration Date: 07/02/2016  . Hemoglobin A1c    Standing Status: Future     Number of Occurrences:      Standing Expiration Date: 07/02/2016  . Ambulatory referral to Home Health    Referral Priority:  Routine    Referral Type:  Home Health Care    Referral Reason:  Specialty Services Required    Requested Specialty:  [place]    Number of Visits Requested:  1  Meds ordered this encounter  Medications  . traMADol (ULTRAM) 50 MG tablet    Sig: Take 1-2 tablets (50-100 mg total) by mouth 2 (two) times daily as needed for moderate pain (can take 2 tabs once a day, 1-2 tabs in PM).    Dispense:  90 tablet    Refill:  2

## 2015-07-03 NOTE — Assessment & Plan Note (Signed)
S: controlled. On Enalapril 20mg  BID, Lasix 20mg , coreg 12.5mg  BID, Amlodipine 10mg  on repeat today.   BP Readings from Last 3 Encounters:  07/03/15 138/68  05/22/15 124/68  04/03/15 124/74  A/P:Continue current meds. Trended up slightly- will watch.

## 2015-07-03 NOTE — Patient Instructions (Addendum)
Blood sugars look better! No changes unless a1c above 7.5.   Refilled tramadol  Blood pressure looks ok on repeat  We will call you within a week about your referral to home health physical therapy. If you do not hear within 2 weeks, give [place] a call.   Return for a1c check within next week or two- call for lab visit  See me in 3 months and a day after labs

## 2015-07-03 NOTE — Assessment & Plan Note (Addendum)
S: reasonably controlled. On lantus 18 units, metformin 2g total daily.  CBGs- fasting on 18 units 109-147 last 20 days. Had been 143-163 on 16 units previously Exercise and diet- poor for both, activity low- will try PT Lab Results  Component Value Date   HGBA1C 7.3* 04/03/2015   HGBA1C 7.6* 12/14/2014   HGBA1C 7.4* 09/22/2014   A/P: continue current medicine, return for a1c

## 2015-07-03 NOTE — Assessment & Plan Note (Addendum)
Low back pain S: continues Tramadol twice a day mainly (2 in AM, 1 in PM). Injections have not helped, not interested in surgery. She did have a fall in the shower the other day due to this pain in combination with back pain. No worsening pain after fall.  A/P: At this point, will refer to PT and OT to see if we can help her with mobility. OT to see if can help with transfers such as to shower. Refilled tramadol.

## 2015-07-08 ENCOUNTER — Telehealth: Payer: Self-pay | Admitting: Family Medicine

## 2015-07-08 NOTE — Telephone Encounter (Signed)
Pt decline home heath physical services

## 2015-07-08 NOTE — Telephone Encounter (Signed)
Noted.  Dr. Yong Channel notified.  Permission to close note.

## 2015-07-22 ENCOUNTER — Other Ambulatory Visit (INDEPENDENT_AMBULATORY_CARE_PROVIDER_SITE_OTHER): Payer: Commercial Managed Care - HMO

## 2015-07-22 DIAGNOSIS — E119 Type 2 diabetes mellitus without complications: Secondary | ICD-10-CM | POA: Diagnosis not present

## 2015-07-22 DIAGNOSIS — Z794 Long term (current) use of insulin: Secondary | ICD-10-CM

## 2015-07-22 LAB — BASIC METABOLIC PANEL
BUN: 19 mg/dL (ref 6–23)
CHLORIDE: 100 meq/L (ref 96–112)
CO2: 28 meq/L (ref 19–32)
CREATININE: 0.8 mg/dL (ref 0.40–1.20)
Calcium: 9.8 mg/dL (ref 8.4–10.5)
GFR: 73.25 mL/min (ref >60.00)
Glucose, Bld: 148 mg/dL — ABNORMAL HIGH (ref 70–99)
POTASSIUM: 4.8 meq/L (ref 3.5–5.1)
Sodium: 137 mEq/L (ref 135–145)

## 2015-07-22 LAB — HEMOGLOBIN A1C: HEMOGLOBIN A1C: 6.6 % — AB (ref 4.6–6.5)

## 2015-07-24 ENCOUNTER — Other Ambulatory Visit: Payer: Self-pay | Admitting: Family Medicine

## 2015-07-29 ENCOUNTER — Other Ambulatory Visit: Payer: Self-pay

## 2015-07-29 NOTE — Patient Outreach (Signed)
[facility]) Care Management  [place]  07/29/2015   Michelle [name] [birth year] [id number]  Subjective: Telephone call to patient for initial health coach outreach.  Patient receptive to call.  Patient reports she is being lazy today as she got back in bed.  Patient reports she lives with her son Michelle [name] who helps take care of her.  However, she reports she is able to bath and dress herself.  Patient reports a fall in the shower as she reports that her footing was not quite right.  Discussed with patient fall prevention and having someone present when she showers.  She verbalized understanding.  Patient reports her blood sugar this morning was 145.  Which she reports is better that before so she is happy.    Heart Failure: Patient weighs daily and weight today was 181 lbs.  Discussed with patient signs and symptoms of heart failure to watch for in addition to weighing.  Will send EMMI information on heart failure.      Objective:   Current Medications:  Current Outpatient Prescriptions  Medication Sig Dispense Refill  . acetaminophen (TYLENOL) 325 MG tablet Take 650 mg by mouth every 6 (six) hours as needed for mild pain. Reported on 07/29/2015    . albuterol (PROVENTIL) (2.5 MG/3ML) 0.083% nebulizer solution TAKE 3 MLS (2.5 MG TOTAL) BY NEBULIZATION EVERY 6 (SIX) HOURS AS NEEDED FOR WHEEZING OR SHORTNESS OF BREATH 75 mL 5  . amLODipine (NORVASC) 10 MG tablet TAKE 1 TABLET (10 MG TOTAL) BY MOUTH DAILY. 30 tablet 4  . aspirin 81 MG tablet Take 81 mg by mouth daily.     . B-D ULTRAFINE III SHORT PEN 31G X 8 MM MISC USE WITH LANTUS SOLOSTAR 100 each 11  . carvedilol (COREG) 12.5 MG tablet TAKE 1 TABLET (12.5 MG TOTAL) BY MOUTH 2 (TWO) TIMES DAILY WITH A MEAL. 60 tablet 5  . clopidogrel (PLAVIX) 75 MG tablet Take 1 tablet (75 mg total) by mouth daily. 30 tablet 11  . CRESTOR 40 MG tablet TAKE 1 TABLET BY MOUTH DAILY 90 tablet 3  . enalapril (VASOTEC) 20 MG tablet TAKE 1  TABLET (20 MG TOTAL) BY MOUTH 2 (TWO) TIMES DAILY. 60 tablet 4  . fenofibrate micronized (LOFIBRA) 134 MG capsule TAKE ONE CAPSULE BY MOUTH DAILY 90 capsule 1  . ferrous sulfate 325 (65 FE) MG tablet Take 1 tablet (325 mg total) by mouth 2 (two) times daily with a meal. 60 tablet 3  . fish oil-omega-3 fatty acids 1000 MG capsule Take 2 g by mouth daily.      . Fluticasone-Salmeterol (ADVAIR DISKUS) 250-50 MCG/DOSE AEPB Inhale 1 puff into the lungs daily as needed. 60 each 5  . folic acid (FOLVITE) A999333 MCG tablet Take 400 mcg by mouth daily.      . furosemide (LASIX) 40 MG tablet Take 1 tablet (40 mg total) by mouth daily. 90 tablet 3  . Insulin Glargine (LANTUS SOLOSTAR) 100 UNIT/ML Solostar Pen Inject 17-20 Units into the skin every morning. 15 mL 11  . metFORMIN (GLUCOPHAGE) 1000 MG tablet TAKE 1 TABLET BY MOUTH TWO TIMES A DAY 60 tablet 5  . NITROSTAT 0.4 MG SL tablet PLACE 1 TABLET (0.4 MG TOTAL) UNDER THE TONGUE EVERY 5 (FIVE) MINUTES AS NEEDED FOR CHEST PAIN (CHEST PAIN OR SHORTNESS OF BREATH). 25 tablet 4  . pantoprazole (PROTONIX) 40 MG tablet TAKE 1 TABLET (40 MG TOTAL) BY MOUTH DAILY. 30 tablet 6  . Potassium  Chloride ER 20 MEQ TBCR TAKE 1 TABLET BY MOUTH ONCE A DAY 90 tablet 2  . rosuvastatin (CRESTOR) 40 MG tablet Take 40 mg by mouth daily.    . sertraline (ZOLOFT) 25 MG tablet TAKE 1 TABLET (25 MG TOTAL) BY MOUTH DAILY. 30 tablet 5  . sodium chloride (OCEAN) 0.65 % SOLN nasal spray Place 1 spray into both nostrils as needed for congestion.    . traMADol (ULTRAM) 50 MG tablet Take 1-2 tablets (50-100 mg total) by mouth 2 (two) times daily as needed for moderate pain (can take 2 tabs once a day, 1-2 tabs in PM). 90 tablet 2  . vitamin E 400 UNIT capsule Take 400 Units by mouth daily.       No current facility-administered medications for this visit.    Functional Status:  In your present state of health, do you have any difficulty performing the following activities: 07/29/2015  12/15/2014  Hearing? N N  Vision? N N  Difficulty concentrating or making decisions? N N  Walking or climbing stairs? Y Y  Dressing or bathing? Y N  Doing errands, shopping? Y N  Preparing Food and eating ? N -  Using the Toilet? N -  In the past six months, have you accidently leaked urine? N -  Do you have problems with loss of bowel control? N -  Managing your Medications? Y -  Managing your Finances? Y -  Housekeeping or managing your Housekeeping? Y -    Fall/Depression Screening: PHQ 2/9 Scores 07/29/2015 01/23/2015 01/07/2014  PHQ - 2 Score 0 0 0    Assessment: Patient will benefit from health coach outreach for disease management for self-management.  Plan:  [facility] CM Care Plan Problem One        Most Recent Value   Care Plan Problem One  Patient falls   Role Documenting the Problem One  [place] for Problem One  Active   THN Long Term Goal (31-90 days)  Patient report no falls within the next 90 days,   THN Long Term Goal Start Date  06/30/15 [goal continued]   Interventions for Problem One [place] reviewed with patient fall precautions and having assistance entering shower.      THN CM Care Plan Problem Two        Most Recent Value   Care Plan for Problem Two  Active   Interventions for Problem Two Long Term Goal   RN Health Coach reviewed signs and symptoms of heart failure with patient.   THN Long Term Goal (31-90) days  Patient will be able to verbalize signs and symptoms of heart failure within 90 days.   THN Long Term Goal Start Date  07/29/15     RN Health Coach will provide ongoing education for patient on heart failure through phone calls and sending printed information to patient for further discussion.  RN Health Coach will send initial barriers letter, assessment, and care plan to primary care physician.  RN Health Coach will contact patient within one month and patient agrees to next contact.   Michelle Baseman, RN,  MSN [place] [phone]

## 2015-08-04 ENCOUNTER — Other Ambulatory Visit: Payer: Self-pay | Admitting: Family Medicine

## 2015-08-21 ENCOUNTER — Other Ambulatory Visit: Payer: Self-pay | Admitting: Family Medicine

## 2015-08-26 ENCOUNTER — Other Ambulatory Visit: Payer: Self-pay

## 2015-08-26 NOTE — Patient Outreach (Signed)
[facility]) Care Management  [place]  08/26/2015   ATHZIRY WYNNE [birth year] [id number]  Subjective: Telephone call to patient for monthly call.  Patient reports she is doing good. She denies any recent falls and is taking a shower when someone is home.  She reports her blood sugar today was 120.  She reports her highest blood sugar was 155 after eating a cookie.  Discussed diabetic diet and limiting carbohydrates and sugars.  She verbalized understanding.   Heart Failure:  Patient continues to weigh daily and last weight was 182 lbs. Patient reports she is watching her salt intake and using frozen vegetables.  Discussed with patient signs and symptoms of heart failure and when to notify physician.  She verbalized understanding.   Objective:   Current Medications:  Current Outpatient Prescriptions  Medication Sig Dispense Refill  . acetaminophen (TYLENOL) 325 MG tablet Take 650 mg by mouth every 6 (six) hours as needed for mild pain. Reported on 07/29/2015    . albuterol (PROVENTIL) (2.5 MG/3ML) 0.083% nebulizer solution TAKE 3 MLS (2.5 MG TOTAL) BY NEBULIZATION EVERY 6 (SIX) HOURS AS NEEDED FOR WHEEZING OR SHORTNESS OF BREATH 75 mL 5  . amLODipine (NORVASC) 10 MG tablet TAKE 1 TABLET (10 MG TOTAL) BY MOUTH DAILY. 30 tablet 4  . aspirin 81 MG tablet Take 81 mg by mouth daily.     . B-D ULTRAFINE III SHORT PEN 31G X 8 MM MISC USE WITH LANTUS SOLOSTAR 100 each 11  . carvedilol (COREG) 12.5 MG tablet TAKE 1 TABLET (12.5 MG TOTAL) BY MOUTH 2 (TWO) TIMES DAILY WITH A MEAL. 60 tablet 5  . clopidogrel (PLAVIX) 75 MG tablet Take 1 tablet (75 mg total) by mouth daily. 30 tablet 11  . CRESTOR 40 MG tablet TAKE 1 TABLET BY MOUTH DAILY 90 tablet 3  . enalapril (VASOTEC) 20 MG tablet TAKE 1 TABLET (20 MG TOTAL) BY MOUTH 2 (TWO) TIMES DAILY. 60 tablet 4  . Fluticasone-Salmeterol (ADVAIR DISKUS) 250-50 MCG/DOSE AEPB Inhale 1 puff into the lungs daily as needed. 60 each 5  .  folic acid (FOLVITE) A999333 MCG tablet Take 400 mcg by mouth daily.      . furosemide (LASIX) 40 MG tablet Take 1 tablet (40 mg total) by mouth daily. 90 tablet 3  . Insulin Glargine (LANTUS SOLOSTAR) 100 UNIT/ML Solostar Pen Inject 17-20 Units into the skin every morning. 15 mL 11  . metFORMIN (GLUCOPHAGE) 1000 MG tablet TAKE 1 TABLET BY MOUTH TWO TIMES A DAY 60 tablet 5  . NITROSTAT 0.4 MG SL tablet PLACE 1 TABLET (0.4 MG TOTAL) UNDER THE TONGUE EVERY 5 (FIVE) MINUTES AS NEEDED FOR CHEST PAIN (CHEST PAIN OR SHORTNESS OF BREATH). 25 tablet 4  . pantoprazole (PROTONIX) 40 MG tablet TAKE 1 TABLET (40 MG TOTAL) BY MOUTH DAILY. 30 tablet 6  . Potassium Chloride ER 20 MEQ TBCR TAKE 1 TABLET BY MOUTH ONCE A DAY 90 tablet 2  . PROAIR HFA 108 (90 Base) MCG/ACT inhaler INHALE 1 PUFF INTO THE LUNGS EVERY 4 (FOUR) HOURS AS NEEDED FOR WHEEZING OR SHORTNESS OF BREATH. 8.5 g 4  . PROAIR HFA 108 (90 Base) MCG/ACT inhaler INHALE 1 PUFF INTO THE LUNGS EVERY 4 (FOUR) HOURS AS NEEDED FOR WHEEZING OR SHORTNESS OF BREATH. 8.5 g 4  . rosuvastatin (CRESTOR) 40 MG tablet Take 40 mg by mouth daily.    . sertraline (ZOLOFT) 25 MG tablet TAKE 1 TABLET (25 MG TOTAL) BY MOUTH DAILY.  30 tablet 5  . sodium chloride (OCEAN) 0.65 % SOLN nasal spray Place 1 spray into both nostrils as needed for congestion.    . traMADol (ULTRAM) 50 MG tablet Take 1-2 tablets (50-100 mg total) by mouth 2 (two) times daily as needed for moderate pain (can take 2 tabs once a day, 1-2 tabs in PM). 90 tablet 2  . enalapril (VASOTEC) 20 MG tablet TAKE 1 TABLET (20 MG TOTAL) BY MOUTH 2 (TWO) TIMES DAILY. (Patient not taking: Reported on 08/26/2015) 60 tablet 4  . fenofibrate micronized (LOFIBRA) 134 MG capsule TAKE ONE CAPSULE BY MOUTH DAILY 90 capsule 1  . ferrous sulfate 325 (65 FE) MG tablet Take 1 tablet (325 mg total) by mouth 2 (two) times daily with a meal. 60 tablet 3  . fish oil-omega-3 fatty acids 1000 MG capsule Take 2 g by mouth daily.      .  vitamin E 400 UNIT capsule Take 400 Units by mouth daily.       No current facility-administered medications for this visit.    Functional Status:  In your present state of health, do you have any difficulty performing the following activities: 07/29/2015 12/15/2014  Hearing? N N  Vision? N N  Difficulty concentrating or making decisions? N N  Walking or climbing stairs? Y Y  Dressing or bathing? Y N  Doing errands, shopping? Y N  Preparing Food and eating ? N -  Using the Toilet? N -  In the past six months, have you accidently leaked urine? N -  Do you have problems with loss of bowel control? N -  Managing your Medications? Y -  Managing your Finances? Y -  Housekeeping or managing your Housekeeping? Y -    Fall/Depression Screening: PHQ 2/9 Scores 08/26/2015 07/29/2015 01/23/2015 01/07/2014  PHQ - 2 Score 0 0 0 0    Assessment: Patient continues to benefit from health coach outreach for disease management and support.    Plan:  [facility] CM Care Plan Problem One        Most Recent Value   Care Plan Problem One  Patient falls   Role Documenting the Problem One  [place] for Problem One  Active   THN Long Term Goal (31-90 days)  Patient report no falls within the next 90 days,   THN Long Term Goal Start Date  06/30/15 [goal continued]   Interventions for Problem One [place] discussed with patient fall precautions and having assistance entering shower.      THN CM Care Plan Problem Two        Most Recent Value   Care Plan for Problem Two  Active   Interventions for Problem Two Long Term Goal   RN Health Coach reinforced signs and symptoms of heart failure with patient.   THN Long Term Goal (31-90) days  Patient will be able to verbalize signs and symptoms of heart failure within 90 days.   THN Long Term Goal Start Date  08/26/15     RN Health Coach will contact patient within one month and patient agrees to next outreach.    Jone Baseman,  RN, MSN [place] [phone]

## 2015-09-02 ENCOUNTER — Ambulatory Visit: Payer: Commercial Managed Care - HMO | Admitting: Family Medicine

## 2015-09-02 ENCOUNTER — Ambulatory Visit (INDEPENDENT_AMBULATORY_CARE_PROVIDER_SITE_OTHER): Payer: Commercial Managed Care - HMO | Admitting: Family Medicine

## 2015-09-02 ENCOUNTER — Encounter: Payer: Self-pay | Admitting: Family Medicine

## 2015-09-02 VITALS — BP 132/66 | HR 73 | Temp 98.7°F

## 2015-09-02 DIAGNOSIS — J449 Chronic obstructive pulmonary disease, unspecified: Secondary | ICD-10-CM

## 2015-09-02 DIAGNOSIS — J441 Chronic obstructive pulmonary disease with (acute) exacerbation: Secondary | ICD-10-CM

## 2015-09-02 MED ORDER — PREDNISONE 20 MG PO TABS: ORAL_TABLET | ORAL | Status: DC

## 2015-09-02 NOTE — Progress Notes (Signed)
[name], MD  Subjective:  Michelle [name] is a 82 y.o. year old very pleasant female patient who presents for/with See problem oriented charting ROS- no fever/chills/nausea/vomiting  Past Medical History-  Patient Active Problem List   Diagnosis Date Noted  . Chronic diastolic CHF (congestive heart failure) ([place]) 03/13/2014    Priority: High  . CAD- RCA PCI '90s, STEMI-RCA DES 02/18/14 03/05/2014    Priority: High  . IDDM type 2 03/26/2007    Priority: High  . Anxiety state 04/21/2014    Priority: Medium  . SOB (shortness of breath) 03/05/2014    Priority: Medium  . Hyperlipidemia with target LDL less than 70 03/26/2007    Priority: Medium  . Essential hypertension 04/21/2006    Priority: Medium  . COPD (chronic obstructive pulmonary disease) ([place]) 04/21/2006    Priority: Medium  . CKD (chronic kidney disease), stage II 07/25/2014    Priority: Low  . Osteoarthritis, knee 05/13/2014    Priority: Low  . Back pain, lumbosacral 10/10/2012    Priority: Low  . Chest pain 12/14/2014    Medications- reviewed and updated Current Outpatient Prescriptions  Medication Sig Dispense Refill  . amLODipine (NORVASC) 10 MG tablet TAKE 1 TABLET (10 MG TOTAL) BY MOUTH DAILY. 30 tablet 4  . aspirin 81 MG tablet Take 81 mg by mouth daily.     . B-D ULTRAFINE III SHORT PEN 31G X 8 MM MISC USE WITH LANTUS SOLOSTAR 100 each 11  . carvedilol (COREG) 12.5 MG tablet TAKE 1 TABLET (12.5 MG TOTAL) BY MOUTH 2 (TWO) TIMES DAILY WITH A MEAL. 60 tablet 5  . clopidogrel (PLAVIX) 75 MG tablet Take 1 tablet (75 mg total) by mouth daily. 30 tablet 11  . CRESTOR 40 MG tablet TAKE 1 TABLET BY MOUTH DAILY 90 tablet 3  . enalapril (VASOTEC) 20 MG tablet TAKE 1 TABLET (20 MG TOTAL) BY MOUTH 2 (TWO) TIMES DAILY. 60 tablet 4  . fenofibrate micronized (LOFIBRA) 134 MG capsule TAKE ONE CAPSULE BY MOUTH DAILY 90 capsule 1  . ferrous sulfate 325 (65 FE) MG tablet Take 1 tablet (325 mg total) by mouth 2 (two) times  daily with a meal. 60 tablet 3  . fish oil-omega-3 fatty acids 1000 MG capsule Take 2 g by mouth daily.      . Fluticasone-Salmeterol (ADVAIR DISKUS) 250-50 MCG/DOSE AEPB Inhale 1 puff into the lungs daily as needed. 60 each 5  . folic acid (FOLVITE) A999333 MCG tablet Take 400 mcg by mouth daily.      . furosemide (LASIX) 40 MG tablet Take 1 tablet (40 mg total) by mouth daily. 90 tablet 3  . Insulin Glargine (LANTUS SOLOSTAR) 100 UNIT/ML Solostar Pen Inject 17-20 Units into the skin every morning. 15 mL 11  . metFORMIN (GLUCOPHAGE) 1000 MG tablet TAKE 1 TABLET BY MOUTH TWO TIMES A DAY 60 tablet 5  . pantoprazole (PROTONIX) 40 MG tablet TAKE 1 TABLET (40 MG TOTAL) BY MOUTH DAILY. 30 tablet 6  . Potassium Chloride ER 20 MEQ TBCR TAKE 1 TABLET BY MOUTH ONCE A DAY 90 tablet 2  . PROAIR HFA 108 (90 Base) MCG/ACT inhaler INHALE 1 PUFF INTO THE LUNGS EVERY 4 (FOUR) HOURS AS NEEDED FOR WHEEZING OR SHORTNESS OF BREATH. 8.5 g 4  . rosuvastatin (CRESTOR) 40 MG tablet Take 40 mg by mouth daily.    . sertraline (ZOLOFT) 25 MG tablet TAKE 1 TABLET (25 MG TOTAL) BY MOUTH DAILY. 30 tablet 5  . sodium  chloride (OCEAN) 0.65 % SOLN nasal spray Place 1 spray into both nostrils as needed for congestion.    . vitamin E 400 UNIT capsule Take 400 Units by mouth daily.      [place] acetaminophen (TYLENOL) 325 MG tablet Take 650 mg by mouth every 6 (six) hours as needed for mild pain. Reported on 09/02/2015    . albuterol (PROVENTIL) (2.5 MG/3ML) 0.083% nebulizer solution TAKE 3 MLS (2.5 MG TOTAL) BY NEBULIZATION EVERY 6 (SIX) HOURS AS NEEDED FOR WHEEZING OR SHORTNESS OF BREATH (Patient not taking: Reported on 09/02/2015) 75 mL 5  . NITROSTAT 0.4 MG SL tablet PLACE 1 TABLET (0.4 MG TOTAL) UNDER THE TONGUE EVERY 5 (FIVE) MINUTES AS NEEDED FOR CHEST PAIN (CHEST PAIN OR SHORTNESS OF BREATH). (Patient not taking: Reported on 09/02/2015) 25 tablet 4  . predniSONE (DELTASONE) 20 MG tablet Take 2 pills for 5 days, 1 pill for 2 days 12 tablet  0  . traMADol (ULTRAM) 50 MG tablet Take 1-2 tablets (50-100 mg total) by mouth 2 (two) times daily as needed for moderate pain (can take 2 tabs once a day, 1-2 tabs in PM). (Patient not taking: Reported on 09/02/2015) 90 tablet 2   No current facility-administered medications for this visit.    Objective: BP 132/66 mmHg  Pulse 73  Temp(Src) 98.7 F (37.1 C)  Wt   SpO2 95% Gen: NAD, resting comfortably CV: RRR no murmurs rubs or gallops Lungs: occasional rhonchi. Good airmovement. Sparing diffuse wheeze.  Abdomen: soft/nontender/nondistended/normal bowel sounds.  Ext: no edema Skin: warm, dry, no rash Neuro: grossly normal, moves all extremities  Assessment/Plan:  COPD (chronic obstructive pulmonary disease) (HCC) Exacerbation S: for 2-3 days has noted increased shortness of breath and wheeze. Had been taking albuterol 3-4 x a day up from 1-2x a day at baseline. This morning woke up and felt deeper cough and worsening wheeze. Coughing up white phlegm when normally clear. No fever. Just took albuterol an hour ago. Son with strep throat recently but no other sick contacts. Compliant with advair.  A/P: We will add prednisone x 7 days per orders. In addition if any purulence to sputum would start doxycycline likely   Return precautions advised. See avs for phone follow up   Meds ordered this encounter  Medications  . predniSONE (DELTASONE) 20 MG tablet    Sig: Take 2 pills for 5 days, 1 pill for 2 days    Dispense:  12 tablet    Refill:  0

## 2015-09-02 NOTE — Patient Instructions (Signed)
COPD flare up Take steroid for 7 days If you have any change in color of what you are coughing up to green or yellow- I want to call in an antibiotic for you Call me to update me on how you are doing Friday morning (getting better, about the same, getting worse) even if you dont call tomorrow. I will try to look at messages either at lunch or late afternoon  If you get worsening shortness of breath or get chest pain need to see you as soon as possible

## 2015-09-02 NOTE — Assessment & Plan Note (Signed)
Exacerbation S: for 2-3 days has noted increased shortness of breath and wheeze. Had been taking albuterol 3-4 x a day up from 1-2x a day at baseline. This morning woke up and felt deeper cough and worsening wheeze. Coughing up white phlegm when normally clear. No fever. Just took albuterol an hour ago. Son with strep throat recently but no other sick contacts. Compliant with advair.  A/P: We will add prednisone x 7 days per orders. In addition if any purulence to sputum would start doxycycline likely

## 2015-09-04 ENCOUNTER — Telehealth: Payer: Self-pay | Admitting: Family Medicine

## 2015-09-04 NOTE — Telephone Encounter (Signed)
FYI

## 2015-09-04 NOTE — Telephone Encounter (Signed)
Pt caid Dr Yong Channel told her to call and let him know how she was feeling. Pt wanted Dr Yong Channel to know that she is doing fine

## 2015-09-04 NOTE — Telephone Encounter (Signed)
Great news. Thanks for update

## 2015-09-15 ENCOUNTER — Other Ambulatory Visit: Payer: Self-pay | Admitting: Family Medicine

## 2015-09-19 ENCOUNTER — Other Ambulatory Visit: Payer: Self-pay | Admitting: Family Medicine

## 2015-09-21 ENCOUNTER — Other Ambulatory Visit: Payer: Self-pay | Admitting: Family Medicine

## 2015-09-21 NOTE — Telephone Encounter (Signed)
Yes thanks 

## 2015-09-21 NOTE — Telephone Encounter (Signed)
Refill ok? 

## 2015-09-23 ENCOUNTER — Other Ambulatory Visit: Payer: Self-pay

## 2015-09-23 NOTE — Patient Outreach (Signed)
[facility]) Care Management  [place]  09/23/2015   Michelle [name] [birth year] [id number]  Subjective: Telephone call to patient for monthly call.  Patient states she is doing better today.  She states yesterday she did not feel her best and stayed in bed most of the day with stomach problems.  Patient states that today's weight was 180 lbs. Patient reports that her blood sugars are good with her last one being 125.  Patient reports she did see her physician since last conversation as she had some shortness or breath.  Patient reports she was put on prednisone and that since then she has done well.  Discussed with patient CHF and COPD and when to notify physician.  She verbalized understanding.    Objective:   Current Medications:  Current Outpatient Prescriptions  Medication Sig Dispense Refill  . acetaminophen (TYLENOL) 325 MG tablet Take 650 mg by mouth every 6 (six) hours as needed for mild pain. Reported on 09/02/2015    . albuterol (PROVENTIL) (2.5 MG/3ML) 0.083% nebulizer solution TAKE 3 MLS (2.5 MG TOTAL) BY NEBULIZATION EVERY 6 (SIX) HOURS AS NEEDED FOR WHEEZING OR SHORTNESS OF BREATH 75 mL 6  . amLODipine (NORVASC) 10 MG tablet TAKE 1 TABLET (10 MG TOTAL) BY MOUTH DAILY. 30 tablet 4  . aspirin 81 MG tablet Take 81 mg by mouth daily.     . B-D ULTRAFINE III SHORT PEN 31G X 8 MM MISC USE WITH LANTUS SOLOSTAR 100 each 11  . carvedilol (COREG) 12.5 MG tablet TAKE 1 TABLET (12.5 MG TOTAL) BY MOUTH 2 (TWO) TIMES DAILY WITH A MEAL. 60 tablet 5  . clopidogrel (PLAVIX) 75 MG tablet Take 1 tablet (75 mg total) by mouth daily. 30 tablet 11  . CRESTOR 40 MG tablet TAKE 1 TABLET BY MOUTH DAILY 90 tablet 3  . enalapril (VASOTEC) 20 MG tablet TAKE 1 TABLET (20 MG TOTAL) BY MOUTH 2 (TWO) TIMES DAILY. 60 tablet 4  . fenofibrate micronized (LOFIBRA) 134 MG capsule TAKE ONE CAPSULE BY MOUTH DAILY 90 capsule 1  . ferrous sulfate 325 (65 FE) MG tablet Take 1 tablet (325 mg  total) by mouth 2 (two) times daily with a meal. 60 tablet 3  . fish oil-omega-3 fatty acids 1000 MG capsule Take 2 g by mouth daily.      . Fluticasone-Salmeterol (ADVAIR DISKUS) 250-50 MCG/DOSE AEPB Inhale 1 puff into the lungs daily as needed. 60 each 5  . folic acid (FOLVITE) A999333 MCG tablet Take 400 mcg by mouth daily.      . furosemide (LASIX) 40 MG tablet Take 1 tablet (40 mg total) by mouth daily. 90 tablet 3  . Insulin Glargine (LANTUS SOLOSTAR) 100 UNIT/ML Solostar Pen Inject 17-20 Units into the skin every morning. 15 mL 11  . metFORMIN (GLUCOPHAGE) 1000 MG tablet TAKE 1 TABLET BY MOUTH TWO TIMES A DAY 60 tablet 5  . NITROSTAT 0.4 MG SL tablet PLACE 1 TABLET (0.4 MG TOTAL) UNDER THE TONGUE EVERY 5 (FIVE) MINUTES AS NEEDED FOR CHEST PAIN (CHEST PAIN OR SHORTNESS OF BREATH). 25 tablet 4  . pantoprazole (PROTONIX) 40 MG tablet TAKE 1 TABLET (40 MG TOTAL) BY MOUTH DAILY. 30 tablet 6  . Potassium Chloride ER 20 MEQ TBCR TAKE 1 TABLET BY MOUTH ONCE A DAY 90 tablet 2  . PROAIR HFA 108 (90 Base) MCG/ACT inhaler INHALE 1 PUFF INTO THE LUNGS EVERY 4 (FOUR) HOURS AS NEEDED FOR WHEEZING OR SHORTNESS OF BREATH. 8.5  g 4  . sertraline (ZOLOFT) 25 MG tablet TAKE 1 TABLET (25 MG TOTAL) BY MOUTH DAILY. 30 tablet 5  . sodium chloride (OCEAN) 0.65 % SOLN nasal spray Place 1 spray into both nostrils as needed for congestion.    . traMADol (ULTRAM) 50 MG tablet TAKE 1 TO 2 TABLETS BY MOUTH TWICE DAILY AS NEEDED FOR MODERATE PAIN 90 tablet 1  . vitamin E 400 UNIT capsule Take 400 Units by mouth daily.      Marland Kitchen albuterol (PROVENTIL) (2.5 MG/3ML) 0.083% nebulizer solution TAKE 3 MLS (2.5 MG TOTAL) BY NEBULIZATION EVERY 6 (SIX) HOURS AS NEEDED FOR WHEEZING OR SHORTNESS OF BREATH (Patient not taking: Reported on 09/02/2015) 75 mL 5  . amLODipine (NORVASC) 10 MG tablet TAKE 1 TABLET (10 MG TOTAL) BY MOUTH DAILY. (Patient not taking: Reported on 09/23/2015) 30 tablet 5  . predniSONE (DELTASONE) 20 MG tablet Take 2 pills  for 5 days, 1 pill for 2 days (Patient not taking: Reported on 09/23/2015) 12 tablet 0  . rosuvastatin (CRESTOR) 40 MG tablet Take 40 mg by mouth daily. Reported on 09/23/2015     No current facility-administered medications for this visit.    Functional Status:  In your present state of health, do you have any difficulty performing the following activities: 07/29/2015 12/15/2014  Hearing? N N  Vision? N N  Difficulty concentrating or making decisions? N N  Walking or climbing stairs? Y Y  Dressing or bathing? Y N  Doing errands, shopping? Y N  Preparing Food and eating ? N -  Using the Toilet? N -  In the past six months, have you accidently leaked urine? N -  Do you have problems with loss of bowel control? N -  Managing your Medications? Y -  Managing your Finances? Y -  Housekeeping or managing your Housekeeping? Y -    Fall/Depression Screening: PHQ 2/9 Scores 09/23/2015 08/26/2015 07/29/2015 01/23/2015 01/07/2014  PHQ - 2 Score 0 0 0 0 0    Assessment: Patient continues to benefit from health coach outreach for disease management.    Plan:  [facility] CM Care Plan Problem One        Most Recent Value   Care Plan Problem One  Patient falls   Role Documenting the Problem One  [place] for Problem One  Active   THN Long Term Goal (31-90 days)  Patient report no falls within the next 90 days,   THN Long Term Goal Start Date  09/23/15 Barrie Folk continued]   Interventions for Problem One Long Term Goal  RN Health Coach reinforced with patient fall precautions and having assistance entering shower.      THN CM Care Plan Problem Two        Most Recent Value   Care Plan for Problem Two  Active   Interventions for Problem Two Long Term Goal   RN Health Coach reemphasized signs and symptoms of heart failure with patient.   THN Long Term Goal (31-90) days  Patient will be able to verbalize signs and symptoms of heart failure within 90 days.   THN Long Term Goal Start Date  09/23/15  [facility] continued]     [place] will contact patient within one month and patient agrees to next outreach.    Jone Baseman, RN, MSN [place] [phone]

## 2015-09-28 ENCOUNTER — Other Ambulatory Visit: Payer: Self-pay | Admitting: Family Medicine

## 2015-10-02 DIAGNOSIS — L812 Freckles: Secondary | ICD-10-CM | POA: Diagnosis not present

## 2015-10-02 DIAGNOSIS — Z85828 Personal history of other malignant neoplasm of skin: Secondary | ICD-10-CM | POA: Diagnosis not present

## 2015-10-02 DIAGNOSIS — D225 Melanocytic nevi of trunk: Secondary | ICD-10-CM | POA: Diagnosis not present

## 2015-10-02 DIAGNOSIS — L821 Other seborrheic keratosis: Secondary | ICD-10-CM | POA: Diagnosis not present

## 2015-10-14 ENCOUNTER — Other Ambulatory Visit: Payer: Self-pay | Admitting: Family Medicine

## 2015-10-21 ENCOUNTER — Other Ambulatory Visit: Payer: Self-pay

## 2015-10-21 NOTE — Patient Outreach (Signed)
[place] [facility]) Care Management  [place]  10/21/2015   Michelle [name] [birth year] [id number]  Subjective: Telephone call to patient for monthly out reach.  Patient reports she is doing good.  She reports weights in the 179-180 lbs range.  Patient reports however having some pain in both her knees from arthritis.  Patient reports she takes tramadol for pain and that really helps.  Discussed with patient heart failure signs and symptoms and when to notify physician.  She verbalized understanding.    Objective:   Encounter Medications:  Outpatient Encounter Prescriptions as of 10/21/2015  Medication Sig Note  . acetaminophen (TYLENOL) 325 MG tablet Take 650 mg by mouth every 6 (six) hours as needed for mild pain. Reported on 09/02/2015   . albuterol (PROVENTIL) (2.5 MG/3ML) 0.083% nebulizer solution TAKE 3 MLS (2.5 MG TOTAL) BY NEBULIZATION EVERY 6 (SIX) HOURS AS NEEDED FOR WHEEZING OR SHORTNESS OF BREATH   . amLODipine (NORVASC) 10 MG tablet TAKE 1 TABLET (10 MG TOTAL) BY MOUTH DAILY.   Marland Kitchen aspirin 81 MG tablet Take 81 mg by mouth daily.  12/14/2014: .  Marland Kitchen B-D ULTRAFINE III SHORT PEN 31G X 8 MM MISC USE WITH LANTUS SOLOSTAR   . carvedilol (COREG) 12.5 MG tablet TAKE 1 TABLET (12.5 MG TOTAL) BY MOUTH 2 (TWO) TIMES DAILY WITH A MEAL.   Marland Kitchen clopidogrel (PLAVIX) 75 MG tablet Take 1 tablet (75 mg total) by mouth daily.   . CRESTOR 40 MG tablet TAKE 1 TABLET BY MOUTH DAILY   . enalapril (VASOTEC) 20 MG tablet TAKE 1 TABLET (20 MG TOTAL) BY MOUTH 2 (TWO) TIMES DAILY.   . fenofibrate micronized (LOFIBRA) 134 MG capsule TAKE ONE CAPSULE BY MOUTH DAILY   . ferrous sulfate 325 (65 FE) MG tablet Take 1 tablet (325 mg total) by mouth 2 (two) times daily with a meal.   . fish oil-omega-3 fatty acids 1000 MG capsule Take 2 g by mouth daily.     . Fluticasone-Salmeterol (ADVAIR DISKUS) 250-50 MCG/DOSE AEPB Inhale 1 puff into the lungs daily as needed.   . folic acid (FOLVITE) A999333 MCG tablet  Take 400 mcg by mouth daily.     . furosemide (LASIX) 40 MG tablet Take 1 tablet (40 mg total) by mouth daily.   . Insulin Glargine (LANTUS SOLOSTAR) 100 UNIT/ML Solostar Pen Inject 17-20 Units into the skin every morning. 08/26/2015: Patient taking 18 units  . metFORMIN (GLUCOPHAGE) 1000 MG tablet TAKE 1 TABLET BY MOUTH TWO TIMES A DAY   . metFORMIN (GLUCOPHAGE) 1000 MG tablet TAKE 1 TABLET BY MOUTH TWO TIMES A DAY   . NITROSTAT 0.4 MG SL tablet PLACE 1 TABLET (0.4 MG TOTAL) UNDER THE TONGUE EVERY 5 (FIVE) MINUTES AS NEEDED FOR CHEST PAIN (CHEST PAIN OR SHORTNESS OF BREATH).   Marland Kitchen pantoprazole (PROTONIX) 40 MG tablet TAKE 1 TABLET (40 MG TOTAL) BY MOUTH DAILY.   Marland Kitchen Potassium Chloride ER 20 MEQ TBCR TAKE 1 TABLET BY MOUTH ONCE A DAY   . PROAIR HFA 108 (90 Base) MCG/ACT inhaler INHALE 1 PUFF INTO THE LUNGS EVERY 4 (FOUR) HOURS AS NEEDED FOR WHEEZING OR SHORTNESS OF BREATH.   . rosuvastatin (CRESTOR) 40 MG tablet Take 40 mg by mouth daily. Reported on 09/23/2015   . sertraline (ZOLOFT) 25 MG tablet TAKE 1 TABLET (25 MG TOTAL) BY MOUTH DAILY.   . sodium chloride (OCEAN) 0.65 % SOLN nasal spray Place 1 spray into both nostrils as needed for congestion.   Marland Kitchen  traMADol (ULTRAM) 50 MG tablet TAKE 1 TO 2 TABLETS BY MOUTH TWICE DAILY AS NEEDED FOR MODERATE PAIN   . vitamin E 400 UNIT capsule Take 400 Units by mouth daily.     Marland Kitchen albuterol (PROVENTIL) (2.5 MG/3ML) 0.083% nebulizer solution TAKE 3 MLS (2.5 MG TOTAL) BY NEBULIZATION EVERY 6 (SIX) HOURS AS NEEDED FOR WHEEZING OR SHORTNESS OF BREATH (Patient not taking: Reported on 09/02/2015)   . amLODipine (NORVASC) 10 MG tablet TAKE 1 TABLET (10 MG TOTAL) BY MOUTH DAILY. (Patient not taking: Reported on 09/23/2015)   . predniSONE (DELTASONE) 20 MG tablet Take 2 pills for 5 days, 1 pill for 2 days (Patient not taking: Reported on 09/23/2015)    No facility-administered encounter medications on file as of 10/21/2015.    Functional Status:  In your present state of health,  do you have any difficulty performing the following activities: 07/29/2015 12/15/2014  Hearing? N N  Vision? N N  Difficulty concentrating or making decisions? N N  Walking or climbing stairs? Y Y  Dressing or bathing? Y N  Doing errands, shopping? Y N  Preparing Food and eating ? N -  Using the Toilet? N -  In the past six months, have you accidently leaked urine? N -  Do you have problems with loss of bowel control? N -  Managing your Medications? Y -  Managing your Finances? Y -  Housekeeping or managing your Housekeeping? Y -    Fall/Depression Screening: PHQ 2/9 Scores 10/21/2015 09/23/2015 08/26/2015 07/29/2015 01/23/2015 01/07/2014  PHQ - 2 Score 0 0 0 0 0 0    Assessment: Patient continues to benefit from health coach outreach for disease management and support.    Plan:  [facility] CM Care Plan Problem One        Most Recent Value   Care Plan Problem One  Patient falls   Role Documenting the Problem One  [place] for Problem One  Active   THN Long Term Goal (31-90 days)  Patient report no falls within the next 90 days,   THN Long Term Goal Start Date  09/23/15 Barrie Folk continued]   Interventions for Problem One [place] reviewed with patient fall precautions and having assistance entering shower.      THN CM Care Plan Problem Two        Most Recent Value   Care Plan for Problem Two  Active   Interventions for Problem Two Long Term Goal   RN Health Coach reviewed signs and symptoms of heart failure with patient.   THN Long Term Goal (31-90) days  Patient will be able to verbalize signs and symptoms of heart failure within 90 days.   THN Long Term Goal Start Date  10/21/15 [facility] continued]     [place] will contact patient within one month and patient agrees to next outreach.    Jone Baseman, RN, MSN [place] [phone]

## 2015-10-23 ENCOUNTER — Encounter: Payer: Self-pay | Admitting: Family Medicine

## 2015-10-23 ENCOUNTER — Ambulatory Visit (INDEPENDENT_AMBULATORY_CARE_PROVIDER_SITE_OTHER): Payer: Commercial Managed Care - HMO | Admitting: Family Medicine

## 2015-10-23 VITALS — BP 138/78 | HR 71 | Temp 98.2°F | Wt 181.0 lb

## 2015-10-23 DIAGNOSIS — I10 Essential (primary) hypertension: Secondary | ICD-10-CM

## 2015-10-23 DIAGNOSIS — E119 Type 2 diabetes mellitus without complications: Secondary | ICD-10-CM

## 2015-10-23 DIAGNOSIS — D509 Iron deficiency anemia, unspecified: Secondary | ICD-10-CM

## 2015-10-23 DIAGNOSIS — Z794 Long term (current) use of insulin: Secondary | ICD-10-CM

## 2015-10-23 DIAGNOSIS — I5032 Chronic diastolic (congestive) heart failure: Secondary | ICD-10-CM

## 2015-10-23 DIAGNOSIS — M170 Bilateral primary osteoarthritis of knee: Secondary | ICD-10-CM

## 2015-10-23 LAB — COMPREHENSIVE METABOLIC PANEL
ALBUMIN: 4.3 g/dL (ref 3.5–5.2)
ALK PHOS: 54 U/L (ref 39–117)
ALT: 15 U/L (ref 0–35)
AST: 17 U/L (ref 0–37)
BILIRUBIN TOTAL: 0.3 mg/dL (ref 0.2–1.2)
BUN: 21 mg/dL (ref 6–23)
CALCIUM: 9.7 mg/dL (ref 8.4–10.5)
CO2: 29 meq/L (ref 19–32)
CREATININE: 0.84 mg/dL (ref 0.40–1.20)
Chloride: 99 mEq/L (ref 96–112)
GFR: 69.2 mL/min (ref >60.00)
Glucose, Bld: 90 mg/dL (ref 70–99)
Potassium: 3.6 mEq/L (ref 3.5–5.1)
Sodium: 137 mEq/L (ref 135–145)
TOTAL PROTEIN: 7.3 g/dL (ref 6.0–8.3)

## 2015-10-23 LAB — HEMOGLOBIN A1C: HEMOGLOBIN A1C: 7 % — AB (ref 4.6–6.5)

## 2015-10-23 LAB — CBC
HCT: 37.5 % (ref 36.0–46.0)
HEMOGLOBIN: 12.7 g/dL (ref 12.0–15.0)
MCHC: 34 g/dL (ref 30.0–36.0)
MCV: 84.6 fl (ref 78.0–100.0)
PLATELETS: 244 10*3/uL (ref 150.0–400.0)
RBC: 4.43 Mil/uL (ref 3.87–5.11)
RDW: 13.8 % (ref 11.5–15.5)
WBC: 6.5 10*3/uL (ref 4.0–10.5)

## 2015-10-23 LAB — FERRITIN: Ferritin: 46.5 ng/mL (ref 10.0–291.0)

## 2015-10-23 NOTE — Patient Instructions (Addendum)
Labs before you go  No change today  Return in 3.5 months  Health Maintenance Due  Topic Date Due  . OPHTHALMOLOGY EXAM  11/16/2014  See your eye doctor and heart doctor!

## 2015-10-23 NOTE — Assessment & Plan Note (Signed)
S: controlled on lasix 40mg , enalapril 20mg  BID, coreg 12.5mg  BID BP Readings from Last 3 Encounters:  10/23/15 138/78  09/02/15 132/66  07/03/15 138/68  A/P:Continue current meds:  Doing very well

## 2015-10-23 NOTE — Assessment & Plan Note (Signed)
S: compliant with lasix . Has actually lost a few lbs Wt Readings from Last 3 Encounters:  10/23/15 181 lb (82.101 kg)  08/26/15 182 lb (82.555 kg)  05/22/15 184 lb 3.2 oz (83.553 kg)  A/P: doing well, continue lasix 40mg 

## 2015-10-23 NOTE — Assessment & Plan Note (Signed)
S: well controlled previously On lantus 18 units and metformin 2g daily CBGs- 101 to 150 in the mornings.  Lab Results  Component Value Date   HGBA1C 6.6* 07/22/2015   HGBA1C 7.3* 04/03/2015   HGBA1C 7.6* 12/14/2014   A/P: check a1c today, a1c goal 7.5 or less

## 2015-10-23 NOTE — Assessment & Plan Note (Signed)
Ferritin 9 a year ago. Takes iron twice a day- recheck ferritin at this time. Along with cbc

## 2015-10-23 NOTE — Progress Notes (Signed)
[name], MD  Subjective:  Michelle [name] is a 82 y.o. year old very pleasant female patient who presents for/with See problem oriented charting ROS- no chest pain. Has not had to use albuterol recently. No swelling in legs. No headaches.   Past Medical History-  Patient Active Problem List   Diagnosis Date Noted  . Chronic diastolic CHF (congestive heart failure) ([place]) 03/13/2014    Priority: High  . CAD- RCA PCI '90s, STEMI-RCA DES 02/18/14 03/05/2014    Priority: High  . IDDM type 2 03/26/2007    Priority: High  . Anemia, iron deficiency 10/23/2015    Priority: Medium  . Anxiety state 04/21/2014    Priority: Medium  . SOB (shortness of breath) 03/05/2014    Priority: Medium  . Hyperlipidemia with target LDL less than 70 03/26/2007    Priority: Medium  . Essential hypertension 04/21/2006    Priority: Medium  . COPD (chronic obstructive pulmonary disease) ([place]) 04/21/2006    Priority: Medium  . CKD (chronic kidney disease), stage II 07/25/2014    Priority: Low  . Osteoarthritis, knee 05/13/2014    Priority: Low  . Back pain, lumbosacral 10/10/2012    Priority: Low  . Chest pain 12/14/2014    Medications- reviewed and updated Current Outpatient Prescriptions  Medication Sig Dispense Refill  . amLODipine (NORVASC) 10 MG tablet TAKE 1 TABLET (10 MG TOTAL) BY MOUTH DAILY. 30 tablet 4  . aspirin 81 MG tablet Take 81 mg by mouth daily.     . B-D ULTRAFINE III SHORT PEN 31G X 8 MM MISC USE WITH LANTUS SOLOSTAR 100 each 10  . carvedilol (COREG) 12.5 MG tablet TAKE 1 TABLET (12.5 MG TOTAL) BY MOUTH 2 (TWO) TIMES DAILY WITH A MEAL. 60 tablet 5  . clopidogrel (PLAVIX) 75 MG tablet Take 1 tablet (75 mg total) by mouth daily. 30 tablet 11  . CRESTOR 40 MG tablet TAKE 1 TABLET BY MOUTH DAILY 90 tablet 3  . enalapril (VASOTEC) 20 MG tablet TAKE 1 TABLET (20 MG TOTAL) BY MOUTH 2 (TWO) TIMES DAILY. 60 tablet 4  . fenofibrate micronized (LOFIBRA) 134 MG capsule TAKE ONE CAPSULE BY  MOUTH DAILY 90 capsule 1  . ferrous sulfate 325 (65 FE) MG tablet Take 1 tablet (325 mg total) by mouth 2 (two) times daily with a meal. 60 tablet 3  . fish oil-omega-3 fatty acids 1000 MG capsule Take 2 g by mouth daily.      . Fluticasone-Salmeterol (ADVAIR DISKUS) 250-50 MCG/DOSE AEPB Inhale 1 puff into the lungs daily as needed. 60 each 5  . folic acid (FOLVITE) A999333 MCG tablet Take 400 mcg by mouth daily.      . furosemide (LASIX) 40 MG tablet Take 1 tablet (40 mg total) by mouth daily. 90 tablet 3  . Insulin Glargine (LANTUS SOLOSTAR) 100 UNIT/ML Solostar Pen Inject 17-20 Units into the skin every morning. 15 mL 11  . metFORMIN (GLUCOPHAGE) 1000 MG tablet TAKE 1 TABLET BY MOUTH TWO TIMES A DAY 60 tablet 5  . pantoprazole (PROTONIX) 40 MG tablet TAKE 1 TABLET (40 MG TOTAL) BY MOUTH DAILY. 30 tablet 6  . Potassium Chloride ER 20 MEQ TBCR TAKE 1 TABLET BY MOUTH ONCE A DAY 90 tablet 2  . PROAIR HFA 108 (90 Base) MCG/ACT inhaler INHALE 1 PUFF INTO THE LUNGS EVERY 4 (FOUR) HOURS AS NEEDED FOR WHEEZING OR SHORTNESS OF BREATH. 8.5 g 4  . rosuvastatin (CRESTOR) 40 MG tablet Take 40 mg by  mouth daily. Reported on 09/23/2015    . sertraline (ZOLOFT) 25 MG tablet TAKE 1 TABLET (25 MG TOTAL) BY MOUTH DAILY. 30 tablet 5  . sodium chloride (OCEAN) 0.65 % SOLN nasal spray Place 1 spray into both nostrils as needed for congestion.    . vitamin E 400 UNIT capsule Take 400 Units by mouth daily.      Marland Kitchen acetaminophen (TYLENOL) 325 MG tablet Take 650 mg by mouth every 6 (six) hours as needed for mild pain. Reported on 10/23/2015    . albuterol (PROVENTIL) (2.5 MG/3ML) 0.083% nebulizer solution TAKE 3 MLS (2.5 MG TOTAL) BY NEBULIZATION EVERY 6 (SIX) HOURS AS NEEDED FOR WHEEZING OR SHORTNESS OF BREATH (Patient not taking: Reported on 10/23/2015) 75 mL 5  . albuterol (PROVENTIL) (2.5 MG/3ML) 0.083% nebulizer solution TAKE 3 MLS (2.5 MG TOTAL) BY NEBULIZATION EVERY 6 (SIX) HOURS AS NEEDED FOR WHEEZING OR SHORTNESS OF BREATH  (Patient not taking: Reported on 10/23/2015) 75 mL 6  . NITROSTAT 0.4 MG SL tablet PLACE 1 TABLET (0.4 MG TOTAL) UNDER THE TONGUE EVERY 5 (FIVE) MINUTES AS NEEDED FOR CHEST PAIN (CHEST PAIN OR SHORTNESS OF BREATH). 25 tablet 4  . traMADol (ULTRAM) 50 MG tablet TAKE 1 TO 2 TABLETS BY MOUTH TWICE DAILY AS NEEDED FOR MODERATE PAIN (Patient not taking: Reported on 10/23/2015) 90 tablet 1   No current facility-administered medications for this visit.    Objective: BP 138/78 mmHg  Pulse 71  Temp(Src) 98.2 F (36.8 C)  Wt 181 lb (82.101 kg) Gen: NAD, resting comfortably CV: RRR no murmurs rubs or gallops Lungs: CTAB no crackles, wheeze, rhonchi Abdomen: soft/nontender/nondistended/normal bowel sounds. No rebound or guarding.  Ext: no edema Skin: warm, dry Neuro: grossly normal, moves all extremities, sitting in wheelchair due to knee pain  Assessment/Plan:  IDDM type 2 S: well controlled previously On lantus 18 units and metformin 2g daily CBGs- 101 to 150 in the mornings.  Lab Results  Component Value Date   HGBA1C 6.6* 07/22/2015   HGBA1C 7.3* 04/03/2015   HGBA1C 7.6* 12/14/2014   A/P: check a1c today, a1c goal 7.5 or less   Essential hypertension S: controlled on lasix 40mg , enalapril 20mg  BID, coreg 12.5mg  BID BP Readings from Last 3 Encounters:  10/23/15 138/78  09/02/15 132/66  07/03/15 138/68  A/P:Continue current meds:  Doing very well  Osteoarthritis, knee S: injections did not help in knees, also has some low back pain from DDD. Tramadol twice a day (2 in AM, 1 in PM). Referred to PT/OT to help with transfers in shower- didn't pick up her phone. No more falls A/P: refill tramadol as needed as long as 3 or less per day  Chronic diastolic CHF (congestive heart failure) S: compliant with lasix . Has actually lost a few lbs Wt Readings from Last 3 Encounters:  10/23/15 181 lb (82.101 kg)  08/26/15 182 lb (82.555 kg)  05/22/15 184 lb 3.2 oz (83.553 kg)  A/P: doing well,  continue lasix 40mg   Anemia, iron deficiency Ferritin 9 a year ago. Takes iron twice a day- recheck ferritin at this time. Along with cbc   Return in about 4 months (around 02/16/2016). Return precautions advised.   Orders Placed This Encounter  Procedures  . CBC    [place]  . Comprehensive metabolic panel    [place]  . Hemoglobin A1c    [place]  . Ferritin

## 2015-10-23 NOTE — Assessment & Plan Note (Signed)
S: injections did not help in knees, also has some low back pain from DDD. Tramadol twice a day (2 in AM, 1 in PM). Referred to PT/OT to help with transfers in shower- didn't pick up her phone. No more falls A/P: refill tramadol as needed as long as 3 or less per day

## 2015-10-31 ENCOUNTER — Other Ambulatory Visit: Payer: Self-pay | Admitting: Family Medicine

## 2015-11-02 ENCOUNTER — Other Ambulatory Visit: Payer: Self-pay | Admitting: Family Medicine

## 2015-11-06 ENCOUNTER — Other Ambulatory Visit: Payer: Self-pay | Admitting: Family Medicine

## 2015-11-06 NOTE — Telephone Encounter (Signed)
Yes thanks with 5 refills

## 2015-11-06 NOTE — Telephone Encounter (Signed)
Refill ok? 90 with 1 refill was just given on 09/22/15.

## 2015-11-17 ENCOUNTER — Other Ambulatory Visit: Payer: Self-pay

## 2015-11-17 ENCOUNTER — Ambulatory Visit: Payer: Self-pay

## 2015-11-17 NOTE — Patient Outreach (Signed)
[place] [facility]) Care Management  11/17/2015  Michelle [name] 12-08-1932 [id number]   Telephone call to patient for monthly outreach.  No answer.  Unable to leave message.    Plan: RN Health Coach will attempt patient within 1-2 weeks.  Jone Baseman, RN, MSN [place] [phone]

## 2015-11-19 ENCOUNTER — Other Ambulatory Visit: Payer: Self-pay

## 2015-11-19 NOTE — Patient Outreach (Signed)
[facility]) Care Management  [place]  11/19/2015   Michelle [name] 04/20/1933 [id number]  Subjective: Telephone call to patient for monthly outreach.  Patient reports she is doing pretty good.  She reports weights at 180 lbs. Patient continues to watch salt intake.  Patient reports seeing primary doctor last month and no changes. Reiterated with patient signs and symptoms of heart failure and when to notify physician.  She verbalized understanding.  No concerns.   Objective:   Encounter Medications:  Outpatient Encounter Prescriptions as of 11/19/2015  Medication Sig Note  . acetaminophen (TYLENOL) 325 MG tablet Take 650 mg by mouth every 6 (six) hours as needed for mild pain. Reported on 10/23/2015   . amLODipine (NORVASC) 10 MG tablet TAKE 1 TABLET (10 MG TOTAL) BY MOUTH DAILY.   Marland Kitchen aspirin 81 MG tablet Take 81 mg by mouth daily.  12/14/2014: .  Marland Kitchen B-D ULTRAFINE III SHORT PEN 31G X 8 MM MISC USE WITH LANTUS SOLOSTAR   . carvedilol (COREG) 12.5 MG tablet TAKE 1 TABLET (12.5 MG TOTAL) BY MOUTH 2 (TWO) TIMES DAILY WITH A MEAL.   Marland Kitchen clopidogrel (PLAVIX) 75 MG tablet Take 1 tablet (75 mg total) by mouth daily.   . CRESTOR 40 MG tablet TAKE 1 TABLET BY MOUTH DAILY   . enalapril (VASOTEC) 20 MG tablet TAKE 1 TABLET (20 MG TOTAL) BY MOUTH 2 (TWO) TIMES DAILY.   . fenofibrate micronized (LOFIBRA) 134 MG capsule TAKE ONE CAPSULE BY MOUTH DAILY   . ferrous sulfate 325 (65 FE) MG tablet Take 1 tablet (325 mg total) by mouth 2 (two) times daily with a meal.   . fish oil-omega-3 fatty acids 1000 MG capsule Take 2 g by mouth daily.     . Fluticasone-Salmeterol (ADVAIR DISKUS) 250-50 MCG/DOSE AEPB Inhale 1 puff into the lungs daily as needed.   . folic acid (FOLVITE) A999333 MCG tablet Take 400 mcg by mouth daily.     . furosemide (LASIX) 40 MG tablet Take 1 tablet (40 mg total) by mouth daily.   . Insulin Glargine (LANTUS SOLOSTAR) 100 UNIT/ML Solostar Pen Inject 17-20 Units into the  skin every morning. 08/26/2015: Patient taking 18 units  . metFORMIN (GLUCOPHAGE) 1000 MG tablet TAKE 1 TABLET BY MOUTH TWO TIMES A DAY   . NITROSTAT 0.4 MG SL tablet PLACE 1 TABLET (0.4 MG TOTAL) UNDER THE TONGUE EVERY 5 (FIVE) MINUTES AS NEEDED FOR CHEST PAIN (CHEST PAIN OR SHORTNESS OF BREATH).   Marland Kitchen pantoprazole (PROTONIX) 40 MG tablet TAKE 1 TABLET (40 MG TOTAL) BY MOUTH DAILY.   Marland Kitchen Potassium Chloride ER 20 MEQ TBCR TAKE 1 TABLET BY MOUTH ONCE A DAY   . PROAIR HFA 108 (90 Base) MCG/ACT inhaler INHALE 1 PUFF INTO THE LUNGS EVERY 4 (FOUR) HOURS AS NEEDED FOR WHEEZING OR SHORTNESS OF BREATH.   . rosuvastatin (CRESTOR) 40 MG tablet Take 40 mg by mouth daily. Reported on 09/23/2015   . sertraline (ZOLOFT) 25 MG tablet TAKE 1 TABLET (25 MG TOTAL) BY MOUTH DAILY.   Marland Kitchen sertraline (ZOLOFT) 25 MG tablet TAKE 1 TABLET (25 MG TOTAL) BY MOUTH DAILY.   . sodium chloride (OCEAN) 0.65 % SOLN nasal spray Place 1 spray into both nostrils as needed for congestion.   . traMADol (ULTRAM) 50 MG tablet TAKE 1 TO 2 TABLETS BY MOUTH TWICE DAILY AS NEEDED FOR MODERATE PAIN   . vitamin E 400 UNIT capsule Take 400 Units by mouth daily.     Marland Kitchen  albuterol (PROVENTIL) (2.5 MG/3ML) 0.083% nebulizer solution TAKE 3 MLS (2.5 MG TOTAL) BY NEBULIZATION EVERY 6 (SIX) HOURS AS NEEDED FOR WHEEZING OR SHORTNESS OF BREATH (Patient not taking: Reported on 10/23/2015)   . albuterol (PROVENTIL) (2.5 MG/3ML) 0.083% nebulizer solution TAKE 3 MLS (2.5 MG TOTAL) BY NEBULIZATION EVERY 6 (SIX) HOURS AS NEEDED FOR WHEEZING OR SHORTNESS OF BREATH (Patient not taking: Reported on 10/23/2015)   . carvedilol (COREG) 12.5 MG tablet TAKE 1 TABLET (12.5 MG TOTAL) BY MOUTH 2 (TWO) TIMES DAILY WITH A MEAL. (Patient not taking: Reported on 11/19/2015)    No facility-administered encounter medications on file as of 11/19/2015.    Functional Status:  In your present state of health, do you have any difficulty performing the following activities: 07/29/2015 12/15/2014   Hearing? N N  Vision? N N  Difficulty concentrating or making decisions? N N  Walking or climbing stairs? Y Y  Dressing or bathing? Y N  Doing errands, shopping? Y N  Preparing Food and eating ? N -  Using the Toilet? N -  In the past six months, have you accidently leaked urine? N -  Do you have problems with loss of bowel control? N -  Managing your Medications? Y -  Managing your Finances? Y -  Housekeeping or managing your Housekeeping? Y -    Fall/Depression Screening: PHQ 2/9 Scores 10/21/2015 09/23/2015 08/26/2015 07/29/2015 01/23/2015 01/07/2014  PHQ - 2 Score 0 0 0 0 0 0    Assessment: Patient continues to benefit from health coach outreach for disease management and support.    Plan:  [facility] CM Care Plan Problem One        Most Recent Value   Care Plan Problem One  Patient falls   Role Documenting the Problem One  [place] for Problem One  Active   THN Long Term Goal (31-90 days)  Patient report no falls within the next 90 days,   THN Long Term Goal Start Date  09/23/15 [facility] continued]   Interventions for Problem One [place] discussed with patient fall precautions and having assistance entering shower.      THN CM Care Plan Problem Two        Most Recent Value   Care Plan for Problem Two  Active   Interventions for Problem Two Long Term Goal   RN Health Coach reiterated signs and symptoms of heart failure with patient.   THN Long Term Goal (31-90) days  Patient will be able to verbalize signs and symptoms of heart failure within 90 days.   THN Long Term Goal Start Date  11/19/15 [facility] continued]     [place] will contact patient within one month and patient agrees to next outreach.   Jone Baseman, RN, MSN [place] [phone]

## 2015-12-17 ENCOUNTER — Other Ambulatory Visit: Payer: Self-pay

## 2015-12-17 NOTE — Patient Outreach (Signed)
[place] [facility]) Care Management  [place]  12/17/2015   TOMEEKA BORTZ 1932-12-28 [id number]  Subjective: Telephone call to patient for monthly call. Patient report she is doing good.  Blood sugar this morning was 140. She reports that her weights are staying around 180 lbs. Patient denies shortness of breath or swelling.  Discussed with patient signs and symptoms of heart failure and when to notify physician.  She verbalized understanding.    Objective:   Encounter Medications:  Outpatient Encounter Prescriptions as of 12/17/2015  Medication Sig Note  . acetaminophen (TYLENOL) 325 MG tablet Take 650 mg by mouth every 6 (six) hours as needed for mild pain. Reported on 10/23/2015   . amLODipine (NORVASC) 10 MG tablet TAKE 1 TABLET (10 MG TOTAL) BY MOUTH DAILY.   Marland Kitchen aspirin 81 MG tablet Take 81 mg by mouth daily.  12/14/2014: .  Marland Kitchen B-D ULTRAFINE III SHORT PEN 31G X 8 MM MISC USE WITH LANTUS SOLOSTAR   . carvedilol (COREG) 12.5 MG tablet TAKE 1 TABLET (12.5 MG TOTAL) BY MOUTH 2 (TWO) TIMES DAILY WITH A MEAL.   Marland Kitchen clopidogrel (PLAVIX) 75 MG tablet Take 1 tablet (75 mg total) by mouth daily.   . CRESTOR 40 MG tablet TAKE 1 TABLET BY MOUTH DAILY   . enalapril (VASOTEC) 20 MG tablet TAKE 1 TABLET (20 MG TOTAL) BY MOUTH 2 (TWO) TIMES DAILY.   . fenofibrate micronized (LOFIBRA) 134 MG capsule TAKE ONE CAPSULE BY MOUTH DAILY   . ferrous sulfate 325 (65 FE) MG tablet Take 1 tablet (325 mg total) by mouth 2 (two) times daily with a meal.   . fish oil-omega-3 fatty acids 1000 MG capsule Take 2 g by mouth daily.     . Fluticasone-Salmeterol (ADVAIR DISKUS) 250-50 MCG/DOSE AEPB Inhale 1 puff into the lungs daily as needed.   . folic acid (FOLVITE) A999333 MCG tablet Take 400 mcg by mouth daily.     . furosemide (LASIX) 40 MG tablet Take 1 tablet (40 mg total) by mouth daily.   . Insulin Glargine (LANTUS SOLOSTAR) 100 UNIT/ML Solostar Pen Inject 17-20 Units into the skin every morning.  08/26/2015: Patient taking 18 units  . metFORMIN (GLUCOPHAGE) 1000 MG tablet TAKE 1 TABLET BY MOUTH TWO TIMES A DAY   . NITROSTAT 0.4 MG SL tablet PLACE 1 TABLET (0.4 MG TOTAL) UNDER THE TONGUE EVERY 5 (FIVE) MINUTES AS NEEDED FOR CHEST PAIN (CHEST PAIN OR SHORTNESS OF BREATH).   Marland Kitchen pantoprazole (PROTONIX) 40 MG tablet TAKE 1 TABLET (40 MG TOTAL) BY MOUTH DAILY.   Marland Kitchen Potassium Chloride ER 20 MEQ TBCR TAKE 1 TABLET BY MOUTH ONCE A DAY   . PROAIR HFA 108 (90 Base) MCG/ACT inhaler INHALE 1 PUFF INTO THE LUNGS EVERY 4 (FOUR) HOURS AS NEEDED FOR WHEEZING OR SHORTNESS OF BREATH.   . rosuvastatin (CRESTOR) 40 MG tablet Take 40 mg by mouth daily. Reported on 09/23/2015   . sertraline (ZOLOFT) 25 MG tablet TAKE 1 TABLET (25 MG TOTAL) BY MOUTH DAILY.   Marland Kitchen sertraline (ZOLOFT) 25 MG tablet TAKE 1 TABLET (25 MG TOTAL) BY MOUTH DAILY.   . sodium chloride (OCEAN) 0.65 % SOLN nasal spray Place 1 spray into both nostrils as needed for congestion.   . traMADol (ULTRAM) 50 MG tablet TAKE 1 TO 2 TABLETS BY MOUTH TWICE DAILY AS NEEDED FOR MODERATE PAIN   . vitamin E 400 UNIT capsule Take 400 Units by mouth daily.     Marland Kitchen  albuterol (PROVENTIL) (2.5 MG/3ML) 0.083% nebulizer solution TAKE 3 MLS (2.5 MG TOTAL) BY NEBULIZATION EVERY 6 (SIX) HOURS AS NEEDED FOR WHEEZING OR SHORTNESS OF BREATH (Patient not taking: Reported on 10/23/2015)   . albuterol (PROVENTIL) (2.5 MG/3ML) 0.083% nebulizer solution TAKE 3 MLS (2.5 MG TOTAL) BY NEBULIZATION EVERY 6 (SIX) HOURS AS NEEDED FOR WHEEZING OR SHORTNESS OF BREATH (Patient not taking: Reported on 10/23/2015)   . carvedilol (COREG) 12.5 MG tablet TAKE 1 TABLET (12.5 MG TOTAL) BY MOUTH 2 (TWO) TIMES DAILY WITH A MEAL. (Patient not taking: Reported on 11/19/2015)    No facility-administered encounter medications on file as of 12/17/2015.    Functional Status:  In your present state of health, do you have any difficulty performing the following activities: 07/29/2015  Hearing? N  Vision? N   Difficulty concentrating or making decisions? N  Walking or climbing stairs? Y  Dressing or bathing? Y  Doing errands, shopping? Y  Preparing Food and eating ? N  Using the Toilet? N  In the past six months, have you accidently leaked urine? N  Do you have problems with loss of bowel control? N  Managing your Medications? Y  Managing your Finances? Y  Housekeeping or managing your Housekeeping? Y    Fall/Depression Screening: PHQ 2/9 Scores 12/17/2015 10/21/2015 09/23/2015 08/26/2015 07/29/2015 01/23/2015 01/07/2014  PHQ - 2 Score 0 0 0 0 0 0 0    Assessment: Patient continues to benefit from health coach outreach for disease management and support.    Plan:  [facility] CM Care Plan Problem One        Most Recent Value   Care Plan Problem One  Patient falls   Role Documenting the Problem One  [place] for Problem One  Active   THN Long Term Goal (31-90 days)  Patient report no falls within the next 90 days,   THN Long Term Goal Start Date  09/23/15 [facility] continued]   Interventions for Problem One [place] reviewed with patient fall precautions and having assistance entering shower.      THN CM Care Plan Problem Two        Most Recent Value   Care Plan for Problem Two  Active   Interventions for Problem Two Long Term Goal   RN Health Coach reinforced signs and symptoms of heart failure with patient.   THN Long Term Goal (31-90) days  Patient will be able to verbalize signs and symptoms of heart failure within 90 days.   THN Long Term Goal Start Date  12/17/15 [facility] continued]     [place] will contact patient within one month and patient agrees to next outreach.  Jone Baseman, RN, MSN [place] [phone]

## 2015-12-24 ENCOUNTER — Telehealth: Payer: Self-pay | Admitting: Family Medicine

## 2015-12-24 NOTE — Telephone Encounter (Signed)
Pt needs a refill on [address]

## 2015-12-24 NOTE — Telephone Encounter (Signed)
Yes thanks, may fill 

## 2015-12-24 NOTE — Telephone Encounter (Signed)
Pharmacy [facility]) is calling pt is completely out of [place].

## 2015-12-25 MED ORDER — TIOTROPIUM BROMIDE MONOHYDRATE 18 MCG IN CAPS: 18.0000 ug | ORAL_CAPSULE | Freq: Every day | RESPIRATORY_TRACT | Status: DC

## 2015-12-29 ENCOUNTER — Other Ambulatory Visit: Payer: Self-pay

## 2015-12-29 MED ORDER — TIOTROPIUM BROMIDE MONOHYDRATE 18 MCG IN CAPS: 18.0000 ug | ORAL_CAPSULE | Freq: Every day | RESPIRATORY_TRACT | Status: DC

## 2016-01-02 ENCOUNTER — Other Ambulatory Visit: Payer: Self-pay | Admitting: Cardiovascular Disease

## 2016-01-08 ENCOUNTER — Encounter: Payer: Self-pay | Admitting: Cardiovascular Disease

## 2016-01-13 ENCOUNTER — Other Ambulatory Visit: Payer: Self-pay

## 2016-01-13 NOTE — Patient Outreach (Signed)
[place] [facility]) Care Management  [place]  01/13/2016   Michelle [name] 1933-02-13 [id number]  Subjective: Telephone call to patient for monthly call. Patient reports she is doing ok but feeling tired.  Patient reports that her weight had gotten up to 186 lbs but is down to 181 lbs now.  Discussed with patient heart failure and when to notify physician. She verbalized understanding.  She reports that her sugars are doing good too and last check was 119.   No concerns.   Objective:   Encounter Medications:  Outpatient Encounter Prescriptions as of 01/13/2016  Medication Sig Note  . acetaminophen (TYLENOL) 325 MG tablet Take 650 mg by mouth every 6 (six) hours as needed for mild pain. Reported on 10/23/2015   . amLODipine (NORVASC) 10 MG tablet TAKE 1 TABLET (10 MG TOTAL) BY MOUTH DAILY.   Marland Kitchen aspirin 81 MG tablet Take 81 mg by mouth daily.  12/14/2014: .  Marland Kitchen B-D ULTRAFINE III SHORT PEN 31G X 8 MM MISC USE WITH LANTUS SOLOSTAR   . carvedilol (COREG) 12.5 MG tablet TAKE 1 TABLET (12.5 MG TOTAL) BY MOUTH 2 (TWO) TIMES DAILY WITH A MEAL.   Marland Kitchen clopidogrel (PLAVIX) 75 MG tablet Take 1 tablet (75 mg total) by mouth daily.   . CRESTOR 40 MG tablet TAKE 1 TABLET BY MOUTH DAILY   . enalapril (VASOTEC) 20 MG tablet TAKE 1 TABLET (20 MG TOTAL) BY MOUTH 2 (TWO) TIMES DAILY.   . fenofibrate micronized (LOFIBRA) 134 MG capsule TAKE ONE CAPSULE BY MOUTH DAILY   . ferrous sulfate 325 (65 FE) MG tablet Take 1 tablet (325 mg total) by mouth 2 (two) times daily with a meal.   . fish oil-omega-3 fatty acids 1000 MG capsule Take 2 g by mouth daily.     . Fluticasone-Salmeterol (ADVAIR DISKUS) 250-50 MCG/DOSE AEPB Inhale 1 puff into the lungs daily as needed.   . folic acid (FOLVITE) 409 MCG tablet Take 400 mcg by mouth daily.     . furosemide (LASIX) 40 MG tablet Take 1 tablet (40 mg total) by mouth daily.   . Insulin Glargine (LANTUS SOLOSTAR) 100 UNIT/ML Solostar Pen Inject 17-20 Units into  the skin every morning. 08/26/2015: Patient taking 18 units  . metFORMIN (GLUCOPHAGE) 1000 MG tablet TAKE 1 TABLET BY MOUTH TWO TIMES A DAY   . NITROSTAT 0.4 MG SL tablet PLACE 1 TABLET (0.4 MG TOTAL) UNDER THE TONGUE EVERY 5 (FIVE) MINUTES AS NEEDED FOR CHEST PAIN (CHEST PAIN OR SHORTNESS OF BREATH).   Marland Kitchen pantoprazole (PROTONIX) 40 MG tablet TAKE 1 TABLET (40 MG TOTAL) BY MOUTH DAILY.   Marland Kitchen Potassium Chloride ER 20 MEQ TBCR TAKE 1 TABLET BY MOUTH ONCE A DAY   . PROAIR HFA 108 (90 Base) MCG/ACT inhaler INHALE 1 PUFF INTO THE LUNGS EVERY 4 (FOUR) HOURS AS NEEDED FOR WHEEZING OR SHORTNESS OF BREATH.   . rosuvastatin (CRESTOR) 40 MG tablet Take 40 mg by mouth daily. Reported on 09/23/2015   . sertraline (ZOLOFT) 25 MG tablet TAKE 1 TABLET (25 MG TOTAL) BY MOUTH DAILY.   Marland Kitchen sertraline (ZOLOFT) 25 MG tablet TAKE 1 TABLET (25 MG TOTAL) BY MOUTH DAILY.   . sodium chloride (OCEAN) 0.65 % SOLN nasal spray Place 1 spray into both nostrils as needed for congestion.   Marland Kitchen tiotropium (SPIRIVA HANDIHALER) 18 MCG inhalation capsule Place 1 capsule (18 mcg total) into inhaler and inhale daily at 2 PM.   . traMADol (ULTRAM) 50  MG tablet TAKE 1 TO 2 TABLETS BY MOUTH TWICE DAILY AS NEEDED FOR MODERATE PAIN   . vitamin E 400 UNIT capsule Take 400 Units by mouth daily.     Marland Kitchen albuterol (PROVENTIL) (2.5 MG/3ML) 0.083% nebulizer solution TAKE 3 MLS (2.5 MG TOTAL) BY NEBULIZATION EVERY 6 (SIX) HOURS AS NEEDED FOR WHEEZING OR SHORTNESS OF BREATH (Patient not taking: Reported on 10/23/2015)   . albuterol (PROVENTIL) (2.5 MG/3ML) 0.083% nebulizer solution TAKE 3 MLS (2.5 MG TOTAL) BY NEBULIZATION EVERY 6 (SIX) HOURS AS NEEDED FOR WHEEZING OR SHORTNESS OF BREATH (Patient not taking: Reported on 10/23/2015)   . carvedilol (COREG) 12.5 MG tablet TAKE 1 TABLET (12.5 MG TOTAL) BY MOUTH 2 (TWO) TIMES DAILY WITH A MEAL. (Patient not taking: Reported on 11/19/2015)    No facility-administered encounter medications on file as of 01/13/2016.     Functional Status:  In your present state of health, do you have any difficulty performing the following activities: 07/29/2015  Hearing? N  Vision? N  Difficulty concentrating or making decisions? N  Walking or climbing stairs? Y  Dressing or bathing? Y  Doing errands, shopping? Y  Preparing Food and eating ? N  Using the Toilet? N  In the past six months, have you accidently leaked urine? N  Do you have problems with loss of bowel control? N  Managing your Medications? Y  Managing your Finances? Y  Housekeeping or managing your Housekeeping? Y    Fall/Depression Screening: PHQ 2/9 Scores 12/17/2015 10/21/2015 09/23/2015 08/26/2015 07/29/2015 01/23/2015 01/07/2014  PHQ - 2 Score 0 0 0 0 0 0 0    Assessment: Patient continues to benefit from health coach outreach for disease management and support.    Plan:  [facility] CM Care Plan Problem One        Most Recent Value   Care Plan Problem One  Patient falls   Role Documenting the Problem One  [place] for Problem One  Active   THN Long Term Goal (31-90 days)  Patient report no falls within the next 90 days,   THN Long Term Goal Start Date  09/23/15   [facility] Long Term Goal Met Date  01/13/16   Interventions for Problem One Long Term Goal  Patient has not had any falls.     THN CM Care Plan Problem Two        Most Recent Value   Care Plan Problem Two  knowledge deficit heart failure   Role Documenting the Problem Two  [place] for Problem Two  Active   Interventions for Problem Two Long Term Goal   RN Health Coach reviewed signs and symptoms of heart failure with patient.   THN Long Term Goal (31-90) days  Patient will be able to verbalize signs and symptoms of heart failure within 90 days.   THN Long Term Goal Start Date  01/13/16 [facility] continued]     [place] will contact patient within one month and patient agrees to next outreach.  Jone Baseman, RN, MSN [place] [phone]

## 2016-01-15 ENCOUNTER — Other Ambulatory Visit: Payer: Self-pay | Admitting: Family Medicine

## 2016-01-27 ENCOUNTER — Other Ambulatory Visit: Payer: Self-pay | Admitting: Family Medicine

## 2016-02-08 ENCOUNTER — Ambulatory Visit: Payer: Commercial Managed Care - HMO | Admitting: Cardiovascular Disease

## 2016-02-09 ENCOUNTER — Other Ambulatory Visit: Payer: Self-pay

## 2016-02-09 NOTE — Patient Outreach (Signed)
[place] [facility]) Care Management  [place]  02/09/2016   Michelle [name] 1932/07/20 [id number]  Subjective: Telephone call to patient for monthly outreach. Patient reports she continues to have problems with her knees. She reports she is taking her tramadol as ordered but some days are better than others.  Patient reports that her sugars are in the 120 range. Patient continues to weigh daily with weights ranging from 180-184 lbs. Discussed with patient signs and symptoms of heart failure and when to notify physician. She verbalized understanding.   Objective:   Encounter Medications:  Outpatient Encounter Prescriptions as of 02/09/2016  Medication Sig Note  . acetaminophen (TYLENOL) 325 MG tablet Take 650 mg by mouth every 6 (six) hours as needed for mild pain. Reported on 10/23/2015   . amLODipine (NORVASC) 10 MG tablet TAKE 1 TABLET (10 MG TOTAL) BY MOUTH DAILY.   Marland Kitchen aspirin 81 MG tablet Take 81 mg by mouth daily.  12/14/2014: .  Marland Kitchen B-D ULTRAFINE III SHORT PEN 31G X 8 MM MISC USE WITH LANTUS SOLOSTAR   . carvedilol (COREG) 12.5 MG tablet TAKE 1 TABLET (12.5 MG TOTAL) BY MOUTH 2 (TWO) TIMES DAILY WITH A MEAL.   . carvedilol (COREG) 12.5 MG tablet TAKE 1 TABLET (12.5 MG TOTAL) BY MOUTH 2 (TWO) TIMES DAILY WITH A MEAL.   Marland Kitchen clopidogrel (PLAVIX) 75 MG tablet Take 1 tablet (75 mg total) by mouth daily.   . CRESTOR 40 MG tablet TAKE 1 TABLET BY MOUTH DAILY   . enalapril (VASOTEC) 20 MG tablet TAKE 1 TABLET (20 MG TOTAL) BY MOUTH 2 (TWO) TIMES DAILY.   . fenofibrate micronized (LOFIBRA) 134 MG capsule TAKE ONE CAPSULE BY MOUTH DAILY   . ferrous sulfate 325 (65 FE) MG tablet Take 1 tablet (325 mg total) by mouth 2 (two) times daily with a meal.   . fish oil-omega-3 fatty acids 1000 MG capsule Take 2 g by mouth daily.     . Fluticasone-Salmeterol (ADVAIR DISKUS) 250-50 MCG/DOSE AEPB Inhale 1 puff into the lungs daily as needed.   . folic acid (FOLVITE) A999333 MCG tablet Take 400 mcg  by mouth daily.     . furosemide (LASIX) 40 MG tablet Take 1 tablet (40 mg total) by mouth daily.   . Insulin Glargine (LANTUS SOLOSTAR) 100 UNIT/ML Solostar Pen Inject 17-20 Units into the skin every morning. 08/26/2015: Patient taking 18 units  . metFORMIN (GLUCOPHAGE) 1000 MG tablet TAKE 1 TABLET BY MOUTH TWO TIMES A DAY   . NITROSTAT 0.4 MG SL tablet PLACE 1 TABLET (0.4 MG TOTAL) UNDER THE TONGUE EVERY 5 (FIVE) MINUTES AS NEEDED FOR CHEST PAIN (CHEST PAIN OR SHORTNESS OF BREATH).   Marland Kitchen pantoprazole (PROTONIX) 40 MG tablet TAKE 1 TABLET (40 MG TOTAL) BY MOUTH DAILY.   Marland Kitchen Potassium Chloride ER 20 MEQ TBCR TAKE 1 TABLET BY MOUTH ONCE A DAY   . PROAIR HFA 108 (90 Base) MCG/ACT inhaler INHALE 1 PUFF INTO THE LUNGS EVERY 4 (FOUR) HOURS AS NEEDED FOR WHEEZING OR SHORTNESS OF BREATH.   . rosuvastatin (CRESTOR) 40 MG tablet Take 40 mg by mouth daily. Reported on 09/23/2015   . sertraline (ZOLOFT) 25 MG tablet TAKE 1 TABLET (25 MG TOTAL) BY MOUTH DAILY.   . sodium chloride (OCEAN) 0.65 % SOLN nasal spray Place 1 spray into both nostrils as needed for congestion.   Marland Kitchen tiotropium (SPIRIVA HANDIHALER) 18 MCG inhalation capsule Place 1 capsule (18 mcg total) into inhaler and inhale  daily at 2 PM.   . traMADol (ULTRAM) 50 MG tablet TAKE 1 TO 2 TABLETS BY MOUTH TWICE DAILY AS NEEDED FOR MODERATE PAIN   . vitamin E 400 UNIT capsule Take 400 Units by mouth daily.     Marland Kitchen albuterol (PROVENTIL) (2.5 MG/3ML) 0.083% nebulizer solution TAKE 3 MLS (2.5 MG TOTAL) BY NEBULIZATION EVERY 6 (SIX) HOURS AS NEEDED FOR WHEEZING OR SHORTNESS OF BREATH (Patient not taking: Reported on 10/23/2015)   . albuterol (PROVENTIL) (2.5 MG/3ML) 0.083% nebulizer solution TAKE 3 MLS (2.5 MG TOTAL) BY NEBULIZATION EVERY 6 (SIX) HOURS AS NEEDED FOR WHEEZING OR SHORTNESS OF BREATH (Patient not taking: Reported on 10/23/2015)   . sertraline (ZOLOFT) 25 MG tablet TAKE 1 TABLET (25 MG TOTAL) BY MOUTH DAILY. (Patient not taking: Reported on 02/09/2016)    No  facility-administered encounter medications on file as of 02/09/2016.     Functional Status:  In your present state of health, do you have any difficulty performing the following activities: 07/29/2015  Hearing? N  Vision? N  Difficulty concentrating or making decisions? N  Walking or climbing stairs? Y  Dressing or bathing? Y  Doing errands, shopping? Y  Preparing Food and eating ? N  Using the Toilet? N  In the past six months, have you accidently leaked urine? N  Do you have problems with loss of bowel control? N  Managing your Medications? Y  Managing your Finances? Y  Housekeeping or managing your Housekeeping? Y  Some recent data might be hidden    Fall/Depression Screening: PHQ 2/9 Scores 12/17/2015 10/21/2015 09/23/2015 08/26/2015 07/29/2015 01/23/2015 01/07/2014  PHQ - 2 Score 0 0 0 0 0 0 0    Assessment: Patient continues to benefit from health coach outreach for disease management and support.    Plan:  [facility] CM Care Plan Problem Two   Flowsheet Row Most Recent Value  Care Plan Problem Two  knowledge deficit heart failure  Role Documenting the Problem Two  [place] for Problem Two  Active  Interventions for Problem Two Long Term Goal   RN Health Coach reinforced signs and symptoms of heart failure with patient.  THN Long Term Goal (31-90) days  Patient will be able to verbalize signs and symptoms of heart failure within 90 days.  THN Long Term Goal Start Date  01/13/16 [facility] continued]     [place] will contact patient in the month of August and patient agrees to next outreach.  Jone Baseman, RN, MSN [place] [phone]

## 2016-02-19 ENCOUNTER — Ambulatory Visit: Payer: Commercial Managed Care - HMO | Admitting: Family Medicine

## 2016-02-22 ENCOUNTER — Ambulatory Visit: Payer: Commercial Managed Care - HMO | Admitting: Family Medicine

## 2016-03-04 ENCOUNTER — Ambulatory Visit (INDEPENDENT_AMBULATORY_CARE_PROVIDER_SITE_OTHER): Payer: Commercial Managed Care - HMO | Admitting: Family Medicine

## 2016-03-04 ENCOUNTER — Encounter: Payer: Self-pay | Admitting: Family Medicine

## 2016-03-04 VITALS — BP 130/80 | HR 83 | Temp 97.4°F | Ht 65.0 in

## 2016-03-04 DIAGNOSIS — E119 Type 2 diabetes mellitus without complications: Secondary | ICD-10-CM

## 2016-03-04 DIAGNOSIS — I10 Essential (primary) hypertension: Secondary | ICD-10-CM | POA: Diagnosis not present

## 2016-03-04 DIAGNOSIS — Z794 Long term (current) use of insulin: Secondary | ICD-10-CM | POA: Diagnosis not present

## 2016-03-04 DIAGNOSIS — J449 Chronic obstructive pulmonary disease, unspecified: Secondary | ICD-10-CM

## 2016-03-04 DIAGNOSIS — M170 Bilateral primary osteoarthritis of knee: Secondary | ICD-10-CM

## 2016-03-04 DIAGNOSIS — I5032 Chronic diastolic (congestive) heart failure: Secondary | ICD-10-CM | POA: Diagnosis not present

## 2016-03-04 MED ORDER — POTASSIUM CHLORIDE ER 20 MEQ PO TBCR: 1.0000 | EXTENDED_RELEASE_TABLET | Freq: Every day | ORAL | 3 refills | Status: DC

## 2016-03-04 MED ORDER — TRIAMCINOLONE ACETONIDE 0.1 % EX CREA: 1.0000 "application " | TOPICAL_CREAM | Freq: Two times a day (BID) | CUTANEOUS | 0 refills | Status: DC

## 2016-03-04 NOTE — Patient Instructions (Addendum)
a1c looks great at 6.5  Trial triamcinolone cream for 1 week in a row max for itching on leg- then need least a week break  Let me know when you need triamcinolone refill  Glad you are only doing half the potassium and were at last labs- has been stable so continue current amount  I am willing to see your son [name] if he has insurance that works for our office

## 2016-03-04 NOTE — Progress Notes (Signed)
Subjective:  Michelle [name] is a 82 y.o. year old very pleasant female patient who presents for/with See problem oriented charting ROS- denies chest pain. Denies regular albuterol use- sparing. No edema or weight gain. [name] any ROS included in HPI as well.   Past Medical History-  Patient Active Problem List   Diagnosis Date Noted  . Chronic diastolic CHF (congestive heart failure) ([place]) 03/13/2014    Priority: High  . CAD- RCA PCI '90s, STEMI-RCA DES 02/18/14 03/05/2014    Priority: High  . IDDM type 2 03/26/2007    Priority: High  . Anemia, iron deficiency 10/23/2015    Priority: Medium  . Osteoarthritis, knee 05/13/2014    Priority: Medium  . Anxiety state 04/21/2014    Priority: Medium  . SOB (shortness of breath) 03/05/2014    Priority: Medium  . Hyperlipidemia with target LDL less than 70 03/26/2007    Priority: Medium  . Essential hypertension 04/21/2006    Priority: Medium  . COPD (chronic obstructive pulmonary disease) ([place]) 04/21/2006    Priority: Medium  . CKD (chronic kidney disease), stage II 07/25/2014    Priority: Low  . Back pain, lumbosacral 10/10/2012    Priority: Low  . Chest pain 12/14/2014    Medications- reviewed and updated Current Outpatient Prescriptions  Medication Sig Dispense Refill  . acetaminophen (TYLENOL) 325 MG tablet Take 650 mg by mouth every 6 (six) hours as needed for mild pain. Reported on 10/23/2015    . albuterol (PROVENTIL) (2.5 MG/3ML) 0.083% nebulizer solution TAKE 3 MLS (2.5 MG TOTAL) BY NEBULIZATION EVERY 6 (SIX) HOURS AS NEEDED FOR WHEEZING OR SHORTNESS OF BREATH 75 mL 5  . albuterol (PROVENTIL) (2.5 MG/3ML) 0.083% nebulizer solution TAKE 3 MLS (2.5 MG TOTAL) BY NEBULIZATION EVERY 6 (SIX) HOURS AS NEEDED FOR WHEEZING OR SHORTNESS OF BREATH 75 mL 6  . amLODipine (NORVASC) 10 MG tablet TAKE 1 TABLET (10 MG TOTAL) BY MOUTH DAILY. 30 tablet 4  . aspirin 81 MG tablet Take 81 mg by mouth daily.     . B-D ULTRAFINE III SHORT PEN 31G X 8 MM  MISC USE WITH LANTUS SOLOSTAR 100 each 10  . carvedilol (COREG) 12.5 MG tablet TAKE 1 TABLET (12.5 MG TOTAL) BY MOUTH 2 (TWO) TIMES DAILY WITH A MEAL. 60 tablet 5  . carvedilol (COREG) 12.5 MG tablet TAKE 1 TABLET (12.5 MG TOTAL) BY MOUTH 2 (TWO) TIMES DAILY WITH A MEAL. 60 tablet 5  . clopidogrel (PLAVIX) 75 MG tablet Take 1 tablet (75 mg total) by mouth daily. 30 tablet 11  . CRESTOR 40 MG tablet TAKE 1 TABLET BY MOUTH DAILY 90 tablet 3  . enalapril (VASOTEC) 20 MG tablet TAKE 1 TABLET (20 MG TOTAL) BY MOUTH 2 (TWO) TIMES DAILY. 60 tablet 1  . fenofibrate micronized (LOFIBRA) 134 MG capsule TAKE ONE CAPSULE BY MOUTH DAILY 90 capsule 3  . ferrous sulfate 325 (65 FE) MG tablet Take 1 tablet (325 mg total) by mouth 2 (two) times daily with a meal. 60 tablet 3  . fish oil-omega-3 fatty acids 1000 MG capsule Take 2 g by mouth daily.      . Fluticasone-Salmeterol (ADVAIR DISKUS) 250-50 MCG/DOSE AEPB Inhale 1 puff into the lungs daily as needed. 60 each 5  . folic acid (FOLVITE) A999333 MCG tablet Take 400 mcg by mouth daily.      . furosemide (LASIX) 40 MG tablet Take 1 tablet (40 mg total) by mouth daily. 90 tablet 3  . Insulin  Glargine (LANTUS SOLOSTAR) 100 UNIT/ML Solostar Pen Inject 17-20 Units into the skin every morning. 15 mL 11  . metFORMIN (GLUCOPHAGE) 1000 MG tablet TAKE 1 TABLET BY MOUTH TWO TIMES A DAY 60 tablet 5  . NITROSTAT 0.4 MG SL tablet PLACE 1 TABLET (0.4 MG TOTAL) UNDER THE TONGUE EVERY 5 (FIVE) MINUTES AS NEEDED FOR CHEST PAIN (CHEST PAIN OR SHORTNESS OF BREATH). 25 tablet 4  . pantoprazole (PROTONIX) 40 MG tablet TAKE 1 TABLET (40 MG TOTAL) BY MOUTH DAILY. 30 tablet 4  . Potassium Chloride ER 20 MEQ TBCR TAKE 1 TABLET BY MOUTH ONCE A DAY 90 tablet 2  . PROAIR HFA 108 (90 Base) MCG/ACT inhaler INHALE 1 PUFF INTO THE LUNGS EVERY 4 (FOUR) HOURS AS NEEDED FOR WHEEZING OR SHORTNESS OF BREATH. 8.5 g 4  . rosuvastatin (CRESTOR) 40 MG tablet Take 40 mg by mouth daily. Reported on 09/23/2015     . sertraline (ZOLOFT) 25 MG tablet TAKE 1 TABLET (25 MG TOTAL) BY MOUTH DAILY. 30 tablet 5  . sertraline (ZOLOFT) 25 MG tablet TAKE 1 TABLET (25 MG TOTAL) BY MOUTH DAILY. 30 tablet 5  . sodium chloride (OCEAN) 0.65 % SOLN nasal spray Place 1 spray into both nostrils as needed for congestion.    Marland Kitchen tiotropium (SPIRIVA HANDIHALER) 18 MCG inhalation capsule Place 1 capsule (18 mcg total) into inhaler and inhale daily at 2 PM. 30 capsule 6  . traMADol (ULTRAM) 50 MG tablet TAKE 1 TO 2 TABLETS BY MOUTH TWICE DAILY AS NEEDED FOR MODERATE PAIN 90 tablet 5  . vitamin E 400 UNIT capsule Take 400 Units by mouth daily.       No current facility-administered medications for this visit.     Objective: BP 130/80   Pulse 83   Temp 97.4 F (36.3 C) (Oral)   Ht 5\' 5"  (1.651 m)   SpO2 95%  Gen: NAD, resting comfortably, appears older than stated age CV: RRR no murmurs rubs or gallops Lungs: CTAB no crackles, wheeze, rhonchi Abdomen: soft/nontender/nondistended/normal bowel sounds. No rebound or guarding.  Ext: no edema  Skin: warm, dry Neuro: grossly normal, moves all extremities, in wheelchair  Assessment/Plan:  Hypertension S: controlled. On lasix 40mg , enalapril 20mg  BID, coreg 12.5mg  BID BP Readings from Last 3 Encounters:  03/04/16 130/80  10/23/15 138/78  09/02/15 132/66  A/P:Continue current medications given good control  IDDM type 2 S: well controlled. On lantus 18 units and metformin 2g daily CBGs- 100s to 150s in AM Exercise and diet- sedentary Lab Results  Component Value Date   HGBA1C 7.0 (H) 10/23/2015   HGBA1C 6.6 (H) 07/22/2015   HGBA1C 7.3 (H) 04/03/2015   A/P: update a1c today at 6.5 (still needs to be entered by CMA), goal 7.5 or less- has been doing well in this regard  Chronic diastolic CHF (congestive heart failure) S: did not want to weight but no edema and denies weight gain at home. No worsening shortness of breath A/P: continue current therapy with lasix 40mg -  has been diligent with this and her daily weights at home (all 180-182)  Osteoarthritis, knee S: continued knee pain. Tramadol 2 in AM, 1 in PM helps. Have discussed not increasing. Did not go to PT/ot with prior referral A/P: may refill tramadol with 3 or less per day- did not need fill today  COPD (chronic obstructive pulmonary disease) (HCC) Compliant with Albuterol rescue (rare), advair, spiriva  Ferritin improved last visit- consider update next visit. Son updates me has  always only been on half pill of potassium 52meq (including last labs when potassium was normal) so we updated in cpu  Triamcinolone Rash on right leg- biopsied previously. derm says benign - multiple erythematous papules. Itches. Trial triamcinolone.   Will see son randy if insurance accepted here  4 months  Meds ordered this encounter  Medications  . Potassium Chloride ER 20 MEQ TBCR    Sig: Take 1 tablet by mouth daily. Take 1/2 a tablet daily    Dispense:  46 tablet    Refill:  3  . Melatonin 5 MG CAPS    Sig: Take by mouth.  . triamcinolone cream (KENALOG) 0.1 %    Sig: Apply 1 application topically 2 (two) times daily.    Dispense:  30 g    Refill:  0    Return precautions advised.  [name], MD

## 2016-03-05 NOTE — Assessment & Plan Note (Signed)
S: well controlled. On lantus 18 units and metformin 2g daily CBGs- 100s to 150s in AM Exercise and diet- sedentary Lab Results  Component Value Date   HGBA1C 7.0 (H) 10/23/2015   HGBA1C 6.6 (H) 07/22/2015   HGBA1C 7.3 (H) 04/03/2015   A/P: update a1c today at 6.5 (still needs to be entered by CMA), goal 7.5 or less- has been doing well in this regard

## 2016-03-05 NOTE — Assessment & Plan Note (Signed)
Compliant with Albuterol rescue (rare), advair, spiriva

## 2016-03-05 NOTE — Assessment & Plan Note (Signed)
S: continued knee pain. Tramadol 2 in AM, 1 in PM helps. Have discussed not increasing. Did not go to PT/ot with prior referral A/P: may refill tramadol with 3 or less per day- did not need fill today

## 2016-03-05 NOTE — Assessment & Plan Note (Signed)
S: did not want to weight but no edema and denies weight gain at home. No worsening shortness of breath A/P: continue current therapy with lasix 40mg - has been diligent with this and her daily weights at home (all 180-182)

## 2016-03-07 LAB — POCT GLYCOSYLATED HEMOGLOBIN (HGB A1C): HEMOGLOBIN A1C: 6.5

## 2016-03-07 NOTE — Progress Notes (Signed)
Order is put in with result.

## 2016-03-07 NOTE — Addendum Note (Signed)
Addended by: Kateri Mc E on: 03/07/2016 07:51 AM   Modules accepted: Orders

## 2016-03-08 ENCOUNTER — Other Ambulatory Visit: Payer: Self-pay

## 2016-03-08 NOTE — Patient Outreach (Signed)
[facility]) Care Management  [place]  03/08/2016   Michelle [name] 1933/03/17 [id number]  Subjective: Telephone call to patient for monthly call.  Patient reports she is being lazy today. She reports she is staying in bed most of the time due to knee pain.  She reports she saw primary doctor last week. Patient reports no changes. Patient reports she does not want knee surgery as she feels she would not do well with it. Patient denies pain presently as she has not been up on them.  Patient reports her weight continues to be around 180-181 lbs. Discussed with patient signs and symptoms of heart failure and when to notify physician. She verbalized understanding.    Objective:   Encounter Medications:  Outpatient Encounter Prescriptions as of 03/08/2016  Medication Sig Note  . acetaminophen (TYLENOL) 325 MG tablet Take 650 mg by mouth every 6 (six) hours as needed for mild pain. Reported on 10/23/2015   . albuterol (PROVENTIL) (2.5 MG/3ML) 0.083% nebulizer solution TAKE 3 MLS (2.5 MG TOTAL) BY NEBULIZATION EVERY 6 (SIX) HOURS AS NEEDED FOR WHEEZING OR SHORTNESS OF BREATH   . amLODipine (NORVASC) 10 MG tablet TAKE 1 TABLET (10 MG TOTAL) BY MOUTH DAILY.   Marland Kitchen aspirin 81 MG tablet Take 81 mg by mouth daily.  12/14/2014: .  Marland Kitchen B-D ULTRAFINE III SHORT PEN 31G X 8 MM MISC USE WITH LANTUS SOLOSTAR   . carvedilol (COREG) 12.5 MG tablet TAKE 1 TABLET (12.5 MG TOTAL) BY MOUTH 2 (TWO) TIMES DAILY WITH A MEAL.   . carvedilol (COREG) 12.5 MG tablet TAKE 1 TABLET (12.5 MG TOTAL) BY MOUTH 2 (TWO) TIMES DAILY WITH A MEAL.   Marland Kitchen clopidogrel (PLAVIX) 75 MG tablet Take 1 tablet (75 mg total) by mouth daily.   . CRESTOR 40 MG tablet TAKE 1 TABLET BY MOUTH DAILY   . enalapril (VASOTEC) 20 MG tablet TAKE 1 TABLET (20 MG TOTAL) BY MOUTH 2 (TWO) TIMES DAILY.   . fenofibrate micronized (LOFIBRA) 134 MG capsule TAKE ONE CAPSULE BY MOUTH DAILY   . ferrous sulfate 325 (65 FE) MG tablet Take 1 tablet  (325 mg total) by mouth 2 (two) times daily with a meal.   . fish oil-omega-3 fatty acids 1000 MG capsule Take 2 g by mouth daily.     . Fluticasone-Salmeterol (ADVAIR DISKUS) 250-50 MCG/DOSE AEPB Inhale 1 puff into the lungs daily as needed.   . folic acid (FOLVITE) A999333 MCG tablet Take 400 mcg by mouth daily.     . furosemide (LASIX) 40 MG tablet Take 1 tablet (40 mg total) by mouth daily.   . Insulin Glargine (LANTUS SOLOSTAR) 100 UNIT/ML Solostar Pen Inject 17-20 Units into the skin every morning. 08/26/2015: Patient taking 18 units  . Melatonin 5 MG CAPS Take by mouth.   . metFORMIN (GLUCOPHAGE) 1000 MG tablet TAKE 1 TABLET BY MOUTH TWO TIMES A DAY   . NITROSTAT 0.4 MG SL tablet PLACE 1 TABLET (0.4 MG TOTAL) UNDER THE TONGUE EVERY 5 (FIVE) MINUTES AS NEEDED FOR CHEST PAIN (CHEST PAIN OR SHORTNESS OF BREATH).   Marland Kitchen pantoprazole (PROTONIX) 40 MG tablet TAKE 1 TABLET (40 MG TOTAL) BY MOUTH DAILY.   Marland Kitchen Potassium Chloride ER 20 MEQ TBCR Take 1 tablet by mouth daily. Take 1/2 a tablet daily   . PROAIR HFA 108 (90 Base) MCG/ACT inhaler INHALE 1 PUFF INTO THE LUNGS EVERY 4 (FOUR) HOURS AS NEEDED FOR WHEEZING OR SHORTNESS OF BREATH.   Marland Kitchen  rosuvastatin (CRESTOR) 40 MG tablet Take 40 mg by mouth daily. Reported on 09/23/2015   . sertraline (ZOLOFT) 25 MG tablet TAKE 1 TABLET (25 MG TOTAL) BY MOUTH DAILY.   Marland Kitchen sertraline (ZOLOFT) 25 MG tablet TAKE 1 TABLET (25 MG TOTAL) BY MOUTH DAILY.   . sodium chloride (OCEAN) 0.65 % SOLN nasal spray Place 1 spray into both nostrils as needed for congestion.   Marland Kitchen tiotropium (SPIRIVA HANDIHALER) 18 MCG inhalation capsule Place 1 capsule (18 mcg total) into inhaler and inhale daily at 2 PM.   . traMADol (ULTRAM) 50 MG tablet TAKE 1 TO 2 TABLETS BY MOUTH TWICE DAILY AS NEEDED FOR MODERATE PAIN   . triamcinolone cream (KENALOG) 0.1 % Apply 1 application topically 2 (two) times daily.   . vitamin E 400 UNIT capsule Take 400 Units by mouth daily.     Marland Kitchen albuterol (PROVENTIL) (2.5  MG/3ML) 0.083% nebulizer solution TAKE 3 MLS (2.5 MG TOTAL) BY NEBULIZATION EVERY 6 (SIX) HOURS AS NEEDED FOR WHEEZING OR SHORTNESS OF BREATH (Patient not taking: Reported on 03/08/2016)    No facility-administered encounter medications on file as of 03/08/2016.     Functional Status:  In your present state of health, do you have any difficulty performing the following activities: 07/29/2015  Hearing? N  Vision? N  Difficulty concentrating or making decisions? N  Walking or climbing stairs? Y  Dressing or bathing? Y  Doing errands, shopping? Y  Preparing Food and eating ? N  Using the Toilet? N  In the past six months, have you accidently leaked urine? N  Do you have problems with loss of bowel control? N  Managing your Medications? Y  Managing your Finances? Y  Housekeeping or managing your Housekeeping? Y  Some recent data might be hidden    Fall/Depression Screening: PHQ 2/9 Scores 12/17/2015 10/21/2015 09/23/2015 08/26/2015 07/29/2015 01/23/2015 01/07/2014  PHQ - 2 Score 0 0 0 0 0 0 0    Assessment: Patient continues to benefit from health coach outreach for disease management and support.    Plan:  [facility] CM Care Plan Problem Two   Flowsheet Row Most Recent Value  Care Plan Problem Two  knowledge deficit heart failure  Role Documenting the Problem Two  [place] for Problem Two  Active  Interventions for Problem Two Long Term Goal   RN Health Coach reviewed signs and symptoms of heart failure with patient.  THN Long Term Goal (31-90) days  Patient will be able to verbalize signs and symptoms of heart failure within 90 days.  THN Long Term Goal Start Date  03/08/16 [facility] continued]     [place] will contact patient in the month of September and patient agrees to next outreach.  Jone Baseman, RN, MSN [place] [phone]

## 2016-03-23 ENCOUNTER — Other Ambulatory Visit: Payer: Self-pay

## 2016-03-23 ENCOUNTER — Telehealth: Payer: Self-pay | Admitting: Family Medicine

## 2016-03-23 MED ORDER — TRAMADOL HCL 50 MG PO TABS: 50.0000 mg | ORAL_TABLET | Freq: Two times a day (BID) | ORAL | 5 refills | Status: DC | PRN

## 2016-03-23 NOTE — Telephone Encounter (Signed)
Pt need new Rx for Tramadol    Pharm:  [facility].  Pt is out and really need it and state that pharmacy said they have sent it in and we have not responded to their request.

## 2016-03-23 NOTE — Telephone Encounter (Signed)
Pt son Michelle [name]) call to check the status of the refill of pts tramadol.

## 2016-03-24 NOTE — Telephone Encounter (Signed)
Prescription was faxed to pharmacy this morning

## 2016-03-26 ENCOUNTER — Other Ambulatory Visit: Payer: Self-pay | Admitting: Family Medicine

## 2016-04-02 ENCOUNTER — Other Ambulatory Visit: Payer: Self-pay | Admitting: Family Medicine

## 2016-04-04 ENCOUNTER — Other Ambulatory Visit: Payer: Self-pay

## 2016-04-04 NOTE — Patient Outreach (Signed)
[place] [facility]) Care Management  04/04/2016  Michelle [name] Oct 25, 1932 [id number]   Telephone call to patient for monthly call.  No answer.  Unable to leave a message.  Plan: RN Health Coach will attempt patient again in the month of September.  Jone Baseman, RN, MSN [place] [phone]

## 2016-04-13 ENCOUNTER — Other Ambulatory Visit: Payer: Self-pay

## 2016-04-13 NOTE — Patient Outreach (Addendum)
[place] [facility]) Care Management  [place]  04/13/2016   Michelle [name] 01/06/1933 [id number]  Subjective: Telephone call to patient for monthly call. Patient reports she is doing ok except her knees bother her especially when she is up on them.   Patient reports she was taking too much of her tramadol and her doctor cut her back for a week. Patient reports now she is taking as prescribed two tablets daily.  Patient reports that her sugars are ranging in the neighborhood of 100-150.  Patient reports that her weight today was 178 lbs. Patient happy that she lost a few pounds.  Discussed with patient heart failure and when to notify physician.  Also encouraged patient to continue low salt, diabetic diet. She verbalized understanding.    Objective:   Encounter Medications:  Outpatient Encounter Prescriptions as of 04/13/2016  Medication Sig Note  . acetaminophen (TYLENOL) 325 MG tablet Take 650 mg by mouth every 6 (six) hours as needed for mild pain. Reported on 10/23/2015   . albuterol (PROVENTIL) (2.5 MG/3ML) 0.083% nebulizer solution TAKE 3 MLS (2.5 MG TOTAL) BY NEBULIZATION EVERY 6 (SIX) HOURS AS NEEDED FOR WHEEZING OR SHORTNESS OF BREATH   . amLODipine (NORVASC) 10 MG tablet TAKE 1 TABLET (10 MG TOTAL) BY MOUTH DAILY.   Marland Kitchen aspirin 81 MG tablet Take 81 mg by mouth daily.  12/14/2014: .  Marland Kitchen B-D ULTRAFINE III SHORT PEN 31G X 8 MM MISC USE WITH LANTUS SOLOSTAR   . carvedilol (COREG) 12.5 MG tablet TAKE 1 TABLET (12.5 MG TOTAL) BY MOUTH 2 (TWO) TIMES DAILY WITH A MEAL.   . carvedilol (COREG) 12.5 MG tablet TAKE 1 TABLET (12.5 MG TOTAL) BY MOUTH 2 (TWO) TIMES DAILY WITH A MEAL.   Marland Kitchen clopidogrel (PLAVIX) 75 MG tablet Take 1 tablet (75 mg total) by mouth daily.   . CRESTOR 40 MG tablet TAKE 1 TABLET BY MOUTH DAILY   . enalapril (VASOTEC) 20 MG tablet TAKE 1 TABLET (20 MG TOTAL) BY MOUTH 2 (TWO) TIMES DAILY.   . fenofibrate micronized (LOFIBRA) 134 MG capsule TAKE ONE CAPSULE BY  MOUTH DAILY   . ferrous sulfate 325 (65 FE) MG tablet Take 1 tablet (325 mg total) by mouth 2 (two) times daily with a meal.   . fish oil-omega-3 fatty acids 1000 MG capsule Take 2 g by mouth daily.     . Fluticasone-Salmeterol (ADVAIR DISKUS) 250-50 MCG/DOSE AEPB Inhale 1 puff into the lungs daily as needed.   . folic acid (FOLVITE) A999333 MCG tablet Take 400 mcg by mouth daily.     . furosemide (LASIX) 40 MG tablet TAKE 1 TABLET (40 MG TOTAL) BY MOUTH DAILY.   Marland Kitchen Insulin Glargine (LANTUS SOLOSTAR) 100 UNIT/ML Solostar Pen Inject 17-20 Units into the skin every morning. 08/26/2015: Patient taking 18 units  . Melatonin 5 MG CAPS Take by mouth.   . metFORMIN (GLUCOPHAGE) 1000 MG tablet TAKE 1 TABLET BY MOUTH TWO TIMES A DAY   . NITROSTAT 0.4 MG SL tablet PLACE 1 TABLET (0.4 MG TOTAL) UNDER THE TONGUE EVERY 5 (FIVE) MINUTES AS NEEDED FOR CHEST PAIN (CHEST PAIN OR SHORTNESS OF BREATH).   Marland Kitchen pantoprazole (PROTONIX) 40 MG tablet TAKE 1 TABLET (40 MG TOTAL) BY MOUTH DAILY.   Marland Kitchen Potassium Chloride ER 20 MEQ TBCR Take 1 tablet by mouth daily. Take 1/2 a tablet daily   . PROAIR HFA 108 (90 Base) MCG/ACT inhaler INHALE 1 PUFF INTO THE LUNGS EVERY 4 (FOUR)  HOURS AS NEEDED FOR WHEEZING OR SHORTNESS OF BREATH.   . rosuvastatin (CRESTOR) 40 MG tablet Take 40 mg by mouth daily. Reported on 09/23/2015   . sertraline (ZOLOFT) 25 MG tablet TAKE 1 TABLET (25 MG TOTAL) BY MOUTH DAILY.   Marland Kitchen sertraline (ZOLOFT) 25 MG tablet TAKE 1 TABLET (25 MG TOTAL) BY MOUTH DAILY.   . sodium chloride (OCEAN) 0.65 % SOLN nasal spray Place 1 spray into both nostrils as needed for congestion.   Marland Kitchen tiotropium (SPIRIVA HANDIHALER) 18 MCG inhalation capsule Place 1 capsule (18 mcg total) into inhaler and inhale daily at 2 PM.   . traMADol (ULTRAM) 50 MG tablet Take 1 tablet (50 mg total) by mouth 2 (two) times daily as needed.   . triamcinolone cream (KENALOG) 0.1 % Apply 1 application topically 2 (two) times daily.   . vitamin E 400 UNIT capsule  Take 400 Units by mouth daily.     Marland Kitchen albuterol (PROVENTIL) (2.5 MG/3ML) 0.083% nebulizer solution TAKE 3 MLS (2.5 MG TOTAL) BY NEBULIZATION EVERY 6 (SIX) HOURS AS NEEDED FOR WHEEZING OR SHORTNESS OF BREATH (Patient not taking: Reported on 04/13/2016)    No facility-administered encounter medications on file as of 04/13/2016.     Functional Status:  In your present state of health, do you have any difficulty performing the following activities: 07/29/2015  Hearing? N  Vision? N  Difficulty concentrating or making decisions? N  Walking or climbing stairs? Y  Dressing or bathing? Y  Doing errands, shopping? Y  Preparing Food and eating ? N  Using the Toilet? N  In the past six months, have you accidently leaked urine? N  Do you have problems with loss of bowel control? N  Managing your Medications? Y  Managing your Finances? Y  Housekeeping or managing your Housekeeping? Y  Some recent data might be hidden    Fall/Depression Screening: PHQ 2/9 Scores 04/13/2016 12/17/2015 10/21/2015 09/23/2015 08/26/2015 07/29/2015 01/23/2015  PHQ - 2 Score 0 0 0 0 0 0 0    Assessment: Patient continues to benefit from health coach outreach for disease management and support.    Plan:  [facility] CM Care Plan Problem Two   Flowsheet Row Most Recent Value  Care Plan Problem Two  knowledge deficit heart failure  Role Documenting the Problem Two  [place] for Problem Two  Active  Interventions for Problem Two Long Term Goal   RN Health Coach reinforced signs and symptoms of heart failure with patient.  THN Long Term Goal (31-90) days  Patient will be able to verbalize signs and symptoms of heart failure within 90 days.  THN Long Term Goal Start Date  04/13/16 [facility] continued]      [place] will contact patient in the month of October and patient agrees to next outreach.  Jone Baseman, RN, MSN [place] [phone]

## 2016-04-15 ENCOUNTER — Ambulatory Visit (INDEPENDENT_AMBULATORY_CARE_PROVIDER_SITE_OTHER): Payer: Commercial Managed Care - HMO | Admitting: Cardiovascular Disease

## 2016-04-15 VITALS — BP 150/62 | HR 76 | Ht 65.0 in | Wt 180.0 lb

## 2016-04-15 DIAGNOSIS — I2510 Atherosclerotic heart disease of native coronary artery without angina pectoris: Secondary | ICD-10-CM | POA: Diagnosis not present

## 2016-04-15 DIAGNOSIS — I5032 Chronic diastolic (congestive) heart failure: Secondary | ICD-10-CM | POA: Diagnosis not present

## 2016-04-15 DIAGNOSIS — I10 Essential (primary) hypertension: Secondary | ICD-10-CM

## 2016-04-15 DIAGNOSIS — E785 Hyperlipidemia, unspecified: Secondary | ICD-10-CM | POA: Diagnosis not present

## 2016-04-15 MED ORDER — POTASSIUM CHLORIDE CRYS ER 20 MEQ PO TBCR: 20.0000 meq | EXTENDED_RELEASE_TABLET | Freq: Every day | ORAL | 11 refills | Status: DC

## 2016-04-15 NOTE — Progress Notes (Signed)
Chief Complaint  Patient presents with  . Coronary Artery Disease     History of Present Illness: 82 y.o. female with a history of diastolic CHF, CAD s/p PTCA of a large dominant RCA in 1990 and inferior STEMI August 2015, DM, HTN, HLD, COPD, former tobacco abuse here today for cardiac follow up. She was admitted with an Inferior STEMI 8/4-02/21/14. Emergent cardiac catheterization demonstrated an occluded RCA with aneurysm formation at the prior angioplasty site. Aneurysm was treated with a Covered stent. Occlusion was treated with 2 drug-eluting stents (one proximal and one distal to the covered stent). There was a small pericardial effusion noted post MI. She was discharged on a short course of steroids and FU echo demonstrated just trivial effusion. Readmitted 8/19-8/20/15 with dyspnea that improved with one dose of IV Lasix. Echo demonstrated normal LVEF and no significant pericardial effusion. Readmitted 0000000 with a/c diastolic CHF. Diuresis was complicated by AKI and ACEI was held. She became dyspneic on Brilinta and she was changed to Effient. She was admitted to [facility] May 2016 with chest pain and ruled out for MI with serial cardiac markers. She was seen by Cardiology and her pain was felt to be musculoskeletal.   She is here today for follow up. She denies chest discomfort with exertion.  She ambulates with a walker. She has no energy. No SOB. Weight stable at home.   Primary Care Physician: [name], MD   Past Medical History:  Diagnosis Date  . Anginal pain ([place])   . Arthritis   . AV block, 1st degree   . CAD (coronary artery disease) 1990; 2015   Cardiac cath 1990 with Dr. Lia Foyer and [name] Residual CAD, non obstructive per 2015 cath. STEMI status post stent in August 2015.  Marland Kitchen COPD (chronic obstructive  pulmonary disease) ([place])   . Diabetes mellitus type 2, insulin dependent ([place])   . Essential hypertension   . Hyperlipidemia with target LDL less than 70   . Myocardial infarction ([place])   . Pericarditis-post MI (short course of steroids) 03/06/2014  . 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 Shortness of breath     Past Surgical History:  Procedure Laterality Date  . CATARACT EXTRACTION  2010  . CHOLECYSTECTOMY    . CORONARY ANGIOPLASTY WITH STENT PLACEMENT  02/18/14   Promus DES to RCA  . LEFT HEART CATHETERIZATION WITH CORONARY ANGIOGRAM N/A 02/18/2014   Procedure: LEFT HEART CATHETERIZATION WITH CORONARY ANGIOGRAM;  Surgeon: Jettie Booze, MD;  Location: [facility] CATH LAB;  Service: Cardiovascular;  Laterality: N/A;  . PTCA  1990   PTCA of RCA  . TRANSTHORACIC ECHOCARDIOGRAM  02/02/2012   mild LVH, EF 55-60%, Normal WM, Gr 1 DD; Mild MR    Current Outpatient Prescriptions  Medication Sig Dispense Refill  . acetaminophen (TYLENOL) 325 MG tablet Take 650 mg by mouth every 6 (six) hours as needed for mild pain. Reported on 10/23/2015    . albuterol (PROVENTIL) (2.5 MG/3ML) 0.083% nebulizer solution TAKE 3 MLS (2.5 MG TOTAL) BY NEBULIZATION EVERY 6 (SIX) HOURS AS NEEDED FOR WHEEZING OR SHORTNESS OF BREATH 75 mL 5  . amLODipine (NORVASC) 10 MG tablet TAKE 1 TABLET (10 MG TOTAL) BY MOUTH DAILY. 30 tablet 4  .  aspirin 81 MG tablet Take 81 mg by mouth daily.     . B-D ULTRAFINE III SHORT PEN 31G X 8 MM MISC USE WITH LANTUS SOLOSTAR 100 each 10  . carvedilol (COREG) 12.5 MG tablet TAKE 1 TABLET (12.5 MG TOTAL) BY MOUTH 2 (TWO) TIMES DAILY WITH A MEAL. 60 tablet 5  . clopidogrel (PLAVIX) 75 MG tablet Take 1 tablet (75 mg total) by mouth daily. 30 tablet 11  . enalapril (VASOTEC) 20 MG tablet TAKE 1 TABLET (20 MG TOTAL) BY MOUTH 2 (TWO) TIMES DAILY. 60 tablet 5  . fenofibrate micronized (LOFIBRA) 134 MG capsule TAKE ONE  CAPSULE BY MOUTH DAILY 90 capsule 3  . ferrous sulfate 325 (65 FE) MG tablet Take 1 tablet (325 mg total) by mouth 2 (two) times daily with a meal. 60 tablet 3  . fish oil-omega-3 fatty acids 1000 MG capsule Take 2 g by mouth daily.      . Fluticasone-Salmeterol (ADVAIR) 250-50 MCG/DOSE AEPB Inhale 1 puff into the lungs daily as needed (shortness of breath or wheezing).    . folic acid (FOLVITE) A999333 MCG tablet Take 400 mcg by mouth daily.      . furosemide (LASIX) 40 MG tablet TAKE 1 TABLET (40 MG TOTAL) BY MOUTH DAILY. 90 tablet 2  . Insulin Glargine (LANTUS SOLOSTAR) 100 UNIT/ML Solostar Pen Inject 17-20 Units into the skin every morning. 15 mL 11  . Melatonin 5 MG CAPS Take 1 capsule by mouth at bedtime.     . metFORMIN (GLUCOPHAGE) 1000 MG tablet TAKE 1 TABLET BY MOUTH TWO TIMES A DAY 60 tablet 5  . NITROSTAT 0.4 MG SL tablet PLACE 1 TABLET (0.4 MG TOTAL) UNDER THE TONGUE EVERY 5 (FIVE) MINUTES AS NEEDED FOR CHEST PAIN (CHEST PAIN OR SHORTNESS OF BREATH). 25 tablet 4  . pantoprazole (PROTONIX) 40 MG tablet TAKE 1 TABLET (40 MG TOTAL) BY MOUTH DAILY. 30 tablet 4  . potassium chloride SA (K-DUR,KLOR-CON) 20 MEQ tablet Take 1 tablet (20 mEq total) by mouth daily. 30 tablet 11  . PROAIR HFA 108 (90 Base) MCG/ACT inhaler INHALE 1 PUFF INTO THE LUNGS EVERY 4 (FOUR) HOURS AS NEEDED FOR WHEEZING OR SHORTNESS OF BREATH. 8.5 g 4  . rosuvastatin (CRESTOR) 40 MG tablet Take 40 mg by mouth daily. Reported on 09/23/2015    . sertraline (ZOLOFT) 25 MG tablet TAKE 1 TABLET (25 MG TOTAL) BY MOUTH DAILY. 30 tablet 5  . sodium chloride (OCEAN) 0.65 % SOLN nasal spray Place 1 spray into both nostrils as needed for congestion.    Marland Kitchen tiotropium (SPIRIVA HANDIHALER) 18 MCG inhalation capsule Place 1 capsule (18 mcg total) into inhaler and inhale daily at 2 PM. 30 capsule 6  . traMADol (ULTRAM) 50 MG tablet Take 50 mg by mouth 2 (two) times daily as needed.    . triamcinolone cream (KENALOG) 0.1 % Apply 1 application  topically 2 (two) times daily. 30 g 0  . vitamin E 400 UNIT capsule Take 400 Units by mouth daily.       No current facility-administered medications for this visit.     Allergies  Allergen Reactions  . Codeine Sulfate Nausea Only  . Morphine Sulfate Nausea Only    Social History   Social History  . Marital status: Married    Spouse name: N/A  . Number of children: 2  . Years of education: N/A   Occupational History  . Retired Retired   Social History Main Topics  .  Smoking status: Former Smoker    Packs/day: 1.00    Years: 55.00    Types: Cigarettes    Quit date: 09/29/2006  . Smokeless tobacco: Never Used  . Alcohol use No  . Drug use: No  . Sexual activity: Not on file   Other Topics Concern  . Not on file   Social History Narrative   Lives with her son but he works all day. Home for dinner.  Independent ADL. Needs assist on some IADL.     Family History  Problem Relation Age of Onset  . Heart attack Mother 61  . Cancer Father 33  . Heart attack Son     Review of Systems:  As stated in the HPI and otherwise negative.   BP (!) 150/62   Pulse 76   Ht 5\' 5"  (1.651 m)   Wt 180 lb (81.6 kg)   BMI 29.95 kg/m   Physical Examination: General: Well developed, well nourished, NAD  HEENT: OP clear, mucus membranes moist  SKIN: warm, dry. No rashes. Neuro: No focal deficits  Musculoskeletal: Muscle strength 5/5 all ext  Psychiatric: Mood and affect normal  Neck: No JVD, no carotid bruits, no thyromegaly, no lymphadenopathy.  Lungs:Clear bilaterally, no wheezes, rhonci, crackles Cardiovascular: Regular rate and rhythm. Soft systolic murmurs, gallops or rubs. Abdomen:Soft. Bowel sounds present. Non-tender.  Extremities: No lower extremity edema. Pulses are 2 + in the bilateral DP/PT.  EKG:  EKG is ordered today. The ekg ordered today demonstrates NSR, rate 75 bpm. No ischemic changes.   Recent Labs: 10/23/2015: ALT 15; BUN 21; Creatinine, Ser 0.84;  Hemoglobin 12.7; Platelets 244.0; Potassium 3.6; Sodium 137   Lipid Panel    Component Value Date/Time   CHOL 147 02/19/2014 0228   TRIG 521 (H) 02/19/2014 0228   TRIG 1693 (HH) 07/28/2006 1202   HDL 43 02/19/2014 0228   CHOLHDL 3.4 02/19/2014 0228   VLDL UNABLE TO CALCULATE IF TRIGLYCERIDE OVER 400 mg/dL 02/19/2014 0228   LDLCALC UNABLE TO CALCULATE IF TRIGLYCERIDE OVER 400 mg/dL 02/19/2014 0228   LDLDIRECT 29.0 09/22/2014 1500     Wt Readings from Last 3 Encounters:  04/15/16 180 lb (81.6 kg)  10/23/15 181 lb (82.1 kg)  08/26/15 182 lb (82.6 kg)     Other studies Reviewed: Additional studies/ records that were reviewed today include:. Review of the above records demonstrates:    Assessment and Plan:   1. CAD: No chest pain suggestive of angina. Continue ASA, Plavix, statin and beta blocker.  2. HTN: BP stable last few office visits, mildly elevated today. No changes.   3. Hyperlipidemia: On statin. LDL at goal. Will have repeat lipids in January in primary care. LFTs ok April 2017.   4. Chronic diastolic CHF: volume status is ok. Continue Lasix 40 mg daily and potassium (KDur 20 meq per day)  Current medicines are reviewed at length with the patient today.  The patient does not have concerns regarding medicines.  The following changes have been made:  no change  Labs/ tests ordered today include:  Orders Placed This Encounter  Procedures  . EKG 12-Lead    Disposition:   FU with me in 6 months  Signed, Lauree Chandler, MD 04/15/2016 5:08 PM    [facility] [address], [place], [place]  [address] Phone: [phone]; Fax: [phone]

## 2016-04-15 NOTE — Patient Instructions (Signed)

## 2016-04-21 ENCOUNTER — Other Ambulatory Visit: Payer: Self-pay | Admitting: Family Medicine

## 2016-04-29 ENCOUNTER — Other Ambulatory Visit: Payer: Self-pay | Admitting: Family Medicine

## 2016-05-04 ENCOUNTER — Telehealth: Payer: Self-pay | Admitting: Family Medicine

## 2016-05-04 NOTE — Telephone Encounter (Signed)
Pt son said tramadol was decrease and he feel like it needs to be increase. Please advice

## 2016-05-05 ENCOUNTER — Other Ambulatory Visit: Payer: Self-pay | Admitting: Cardiovascular Disease

## 2016-05-05 DIAGNOSIS — I2510 Atherosclerotic heart disease of native coronary artery without angina pectoris: Secondary | ICD-10-CM

## 2016-05-05 NOTE — Telephone Encounter (Signed)
Clarify what decrease he is referencing. Have listed 1-2 tablets twice a day

## 2016-05-05 NOTE — Telephone Encounter (Signed)
Pts son calling to check the status of the previous message.  States mother is in a lot of pain and really do not know what to do for her at this point.

## 2016-05-06 NOTE — Telephone Encounter (Signed)
Spoke with pt and informed her that we had faxed it over on 03/24/16. She states that the Pharmacy told her that we had refused to fill the prescription. I read the paper copy to the patient with fax timestamp and she stated she would call them and let them know. I told her to let me know if I needed to refax the prescription or she can pick it up.

## 2016-05-09 ENCOUNTER — Other Ambulatory Visit: Payer: Self-pay

## 2016-05-09 NOTE — Patient Outreach (Signed)
[facility]) Care Management  [place]  05/09/2016   Michelle [name] 1933-03-04 [id number]  Subjective: Telephone call to patient for monthly call.  Patient reports she is doing ok just still dealing with her knees. Patient reports she tries to stay off of them due to pain. Patient continues to take tramadol for pain. Patient reports that her sugars are fine and that her sugar this morning was 121.  Weight today was 180 lbs.  Discussed with patient signs of heart failure and when to notify physician. She verbalized understanding.  No concerns.    Objective:   Encounter Medications:  Outpatient Encounter Prescriptions as of 05/09/2016  Medication Sig Note  . acetaminophen (TYLENOL) 325 MG tablet Take 650 mg by mouth every 6 (six) hours as needed for mild pain. Reported on 10/23/2015   . ADVAIR DISKUS 250-50 MCG/DOSE AEPB INHALE 1 PUFF INTO THE LUNGS DAILY AS NEEDED.   Marland Kitchen albuterol (PROVENTIL) (2.5 MG/3ML) 0.083% nebulizer solution TAKE 3 MLS (2.5 MG TOTAL) BY NEBULIZATION EVERY 6 (SIX) HOURS AS NEEDED FOR WHEEZING OR SHORTNESS OF BREATH   . amLODipine (NORVASC) 10 MG tablet TAKE 1 TABLET (10 MG TOTAL) BY MOUTH DAILY.   Marland Kitchen aspirin 81 MG tablet Take 81 mg by mouth daily.  12/14/2014: .  Marland Kitchen B-D ULTRAFINE III SHORT PEN 31G X 8 MM MISC USE WITH LANTUS SOLOSTAR   . carvedilol (COREG) 12.5 MG tablet TAKE 1 TABLET (12.5 MG TOTAL) BY MOUTH 2 (TWO) TIMES DAILY WITH A MEAL.   Marland Kitchen clopidogrel (PLAVIX) 75 MG tablet TAKE 1 TABLET (75 MG TOTAL) BY MOUTH DAILY.   Marland Kitchen enalapril (VASOTEC) 20 MG tablet TAKE 1 TABLET (20 MG TOTAL) BY MOUTH 2 (TWO) TIMES DAILY.   . fenofibrate micronized (LOFIBRA) 134 MG capsule TAKE ONE CAPSULE BY MOUTH DAILY   . ferrous sulfate 325 (65 FE) MG tablet Take 1 tablet (325 mg total) by mouth 2 (two) times daily with a meal.   . fish oil-omega-3 fatty acids 1000 MG capsule Take 2 g by mouth daily.     . folic acid (FOLVITE) A999333 MCG tablet Take 400 mcg by mouth  daily.     . furosemide (LASIX) 40 MG tablet TAKE 1 TABLET (40 MG TOTAL) BY MOUTH DAILY.   Marland Kitchen Insulin Glargine (LANTUS SOLOSTAR) 100 UNIT/ML Solostar Pen Inject 17-20 Units into the skin every morning.   . Melatonin 5 MG CAPS Take 1 capsule by mouth at bedtime.    . metFORMIN (GLUCOPHAGE) 1000 MG tablet TAKE 1 TABLET BY MOUTH TWO TIMES A DAY   . NITROSTAT 0.4 MG SL tablet PLACE 1 TABLET (0.4 MG TOTAL) UNDER THE TONGUE EVERY 5 (FIVE) MINUTES AS NEEDED FOR CHEST PAIN (CHEST PAIN OR SHORTNESS OF BREATH).   Marland Kitchen pantoprazole (PROTONIX) 40 MG tablet TAKE 1 TABLET (40 MG TOTAL) BY MOUTH DAILY.   Marland Kitchen potassium chloride SA (K-DUR,KLOR-CON) 20 MEQ tablet Take 1 tablet (20 mEq total) by mouth daily.   Marland Kitchen PROAIR HFA 108 (90 Base) MCG/ACT inhaler INHALE 1 PUFF INTO THE LUNGS EVERY 4 (FOUR) HOURS AS NEEDED FOR WHEEZING OR SHORTNESS OF BREATH.   . rosuvastatin (CRESTOR) 40 MG tablet TAKE 1 TABLET BY MOUTH DAILY   . sertraline (ZOLOFT) 25 MG tablet TAKE 1 TABLET (25 MG TOTAL) BY MOUTH DAILY.   . sodium chloride (OCEAN) 0.65 % SOLN nasal spray Place 1 spray into both nostrils as needed for congestion.   Marland Kitchen tiotropium (SPIRIVA HANDIHALER) 18 MCG inhalation  capsule Place 1 capsule (18 mcg total) into inhaler and inhale daily at 2 PM.   . traMADol (ULTRAM) 50 MG tablet Take 50 mg by mouth 2 (two) times daily as needed.   . triamcinolone cream (KENALOG) 0.1 % Apply 1 application topically 2 (two) times daily.   . vitamin E 400 UNIT capsule Take 400 Units by mouth daily.      No facility-administered encounter medications on file as of 05/09/2016.     Functional Status:  In your present state of health, do you have any difficulty performing the following activities: 07/29/2015  Hearing? N  Vision? N  Difficulty concentrating or making decisions? N  Walking or climbing stairs? Y  Dressing or bathing? Y  Doing errands, shopping? Y  Preparing Food and eating ? N  Using the Toilet? N  In the past six months, have you  accidently leaked urine? N  Do you have problems with loss of bowel control? N  Managing your Medications? Y  Managing your Finances? Y  Housekeeping or managing your Housekeeping? Y  Some recent data might be hidden    Fall/Depression Screening: PHQ 2/9 Scores 05/09/2016 04/13/2016 12/17/2015 10/21/2015 09/23/2015 08/26/2015 07/29/2015  PHQ - 2 Score 0 0 0 0 0 0 0    Assessment: Patient continues to benefit from health coach outreach for disease management and support.     Plan:  [facility] CM Care Plan Problem Two   Flowsheet Row Most Recent Value  Care Plan Problem Two  knowledge deficit heart failure  Role Documenting the Problem Two  [place] for Problem Two  Active  Interventions for Problem Two Long Term Goal   RN Health Coach reiterated signs and symptoms of heart failure with patient.  THN Long Term Goal (31-90) days  Patient will be able to verbalize signs and symptoms of heart failure within 90 days.  THN Long Term Goal Start Date  05/09/16 [facility] continued]     [place] will contact patient in the month of November and patient agrees to next outreach.  Jone Baseman, RN, MSN [place] [phone]

## 2016-05-11 ENCOUNTER — Other Ambulatory Visit: Payer: Self-pay | Admitting: Family Medicine

## 2016-05-18 ENCOUNTER — Other Ambulatory Visit: Payer: Self-pay | Admitting: Family Medicine

## 2016-05-30 ENCOUNTER — Other Ambulatory Visit: Payer: Self-pay

## 2016-05-30 MED ORDER — CARVEDILOL 12.5 MG PO TABS: ORAL_TABLET | ORAL | 5 refills | Status: DC

## 2016-05-31 ENCOUNTER — Other Ambulatory Visit: Payer: Self-pay | Admitting: Family Medicine

## 2016-06-02 ENCOUNTER — Other Ambulatory Visit: Payer: Self-pay

## 2016-06-02 NOTE — Patient Outreach (Signed)
[facility]) Care Management  [place]  06/02/2016   SELA RUZZO [birth year] [id number]  Subjective: Telephone call to patient for monthly call. She reports she is doing ok except her knees.  She rates pain today at 4/10.  She states she took her pain medication this morning early.  Patient reports that her weights stays around 180 lbs and her blood sugar today was 150.  Discussed with patient heart failure and diabetic diet.  She verbalized understanding.   Objective:   Encounter Medications:  Outpatient Encounter Prescriptions as of 06/02/2016  Medication Sig Note  . acetaminophen (TYLENOL) 325 MG tablet Take 650 mg by mouth every 6 (six) hours as needed for mild pain. Reported on 10/23/2015   . ADVAIR DISKUS 250-50 MCG/DOSE AEPB INHALE 1 PUFF INTO THE LUNGS DAILY AS NEEDED.   Marland Kitchen albuterol (PROVENTIL) (2.5 MG/3ML) 0.083% nebulizer solution TAKE 3 MLS (2.5 MG TOTAL) BY NEBULIZATION EVERY 6 (SIX) HOURS AS NEEDED FOR WHEEZING OR SHORTNESS OF BREATH   . amLODipine (NORVASC) 10 MG tablet TAKE 1 TABLET (10 MG TOTAL) BY MOUTH DAILY.   Marland Kitchen aspirin 81 MG tablet Take 81 mg by mouth daily.  12/14/2014: .  Marland Kitchen B-D ULTRAFINE III SHORT PEN 31G X 8 MM MISC USE WITH LANTUS SOLOSTAR   . carvedilol (COREG) 12.5 MG tablet TAKE 1 TABLET (12.5 MG TOTAL) BY MOUTH 2 (TWO) TIMES DAILY WITH A MEAL.   Marland Kitchen clopidogrel (PLAVIX) 75 MG tablet TAKE 1 TABLET (75 MG TOTAL) BY MOUTH DAILY.   Marland Kitchen enalapril (VASOTEC) 20 MG tablet TAKE 1 TABLET (20 MG TOTAL) BY MOUTH 2 (TWO) TIMES DAILY.   . fenofibrate micronized (LOFIBRA) 134 MG capsule TAKE ONE CAPSULE BY MOUTH DAILY   . ferrous sulfate 325 (65 FE) MG tablet Take 1 tablet (325 mg total) by mouth 2 (two) times daily with a meal.   . fish oil-omega-3 fatty acids 1000 MG capsule Take 2 g by mouth daily.     . folic acid (FOLVITE) A999333 MCG tablet Take 400 mcg by mouth daily.     . furosemide (LASIX) 40 MG tablet TAKE 1 TABLET (40 MG TOTAL) BY MOUTH DAILY.    Marland Kitchen LANTUS SOLOSTAR 100 UNIT/ML Solostar Pen INJECT 17-20 UNITS INTO THE SKIN EVERY MORNING.   . Melatonin 5 MG CAPS Take 1 capsule by mouth at bedtime.    . metFORMIN (GLUCOPHAGE) 1000 MG tablet TAKE 1 TABLET BY MOUTH TWO TIMES A DAY   . NITROSTAT 0.4 MG SL tablet PLACE 1 TABLET (0.4 MG TOTAL) UNDER THE TONGUE EVERY 5 (FIVE) MINUTES AS NEEDED FOR CHEST PAIN (CHEST PAIN OR SHORTNESS OF BREATH).   Marland Kitchen pantoprazole (PROTONIX) 40 MG tablet TAKE 1 TABLET (40 MG TOTAL) BY MOUTH DAILY.   Marland Kitchen potassium chloride SA (K-DUR,KLOR-CON) 20 MEQ tablet Take 1 tablet (20 mEq total) by mouth daily.   Marland Kitchen PROAIR HFA 108 (90 Base) MCG/ACT inhaler INHALE 1 PUFF INTO THE LUNGS EVERY 4 (FOUR) HOURS AS NEEDED FOR WHEEZING OR SHORTNESS OF BREATH.   . rosuvastatin (CRESTOR) 40 MG tablet TAKE 1 TABLET BY MOUTH DAILY   . sertraline (ZOLOFT) 25 MG tablet TAKE 1 TABLET (25 MG TOTAL) BY MOUTH DAILY.   . sodium chloride (OCEAN) 0.65 % SOLN nasal spray Place 1 spray into both nostrils as needed for congestion.   Marland Kitchen tiotropium (SPIRIVA HANDIHALER) 18 MCG inhalation capsule Place 1 capsule (18 mcg total) into inhaler and inhale daily at 2 PM.   .  traMADol (ULTRAM) 50 MG tablet Take 50 mg by mouth 2 (two) times daily as needed.   . triamcinolone cream (KENALOG) 0.1 % Apply 1 application topically 2 (two) times daily.   . vitamin E 400 UNIT capsule Take 400 Units by mouth daily.      No facility-administered encounter medications on file as of 06/02/2016.     Functional Status:  In your present state of health, do you have any difficulty performing the following activities: 07/29/2015  Hearing? N  Vision? N  Difficulty concentrating or making decisions? N  Walking or climbing stairs? Y  Dressing or bathing? Y  Doing errands, shopping? Y  Preparing Food and eating ? N  Using the Toilet? N  In the past six months, have you accidently leaked urine? N  Do you have problems with loss of bowel control? N  Managing your Medications? Y   Managing your Finances? Y  Housekeeping or managing your Housekeeping? Y  Some recent data might be hidden    Fall/Depression Screening: PHQ 2/9 Scores 06/02/2016 05/09/2016 04/13/2016 12/17/2015 10/21/2015 09/23/2015 08/26/2015  PHQ - 2 Score 0 0 0 0 0 0 0    Assessment: Patient continues to benefit from health coach outreach for disease management and support.    Plan:  [facility] CM Care Plan Problem Two   Flowsheet Row Most Recent Value  Care Plan Problem Two  knowledge deficit heart failure  Role Documenting the Problem Two  [place] for Problem Two  Active  Interventions for Problem Two Long Term Goal   RN Health Coach discussed signs and symptoms of heart failure with patient.  THN Long Term Goal (31-90) days  Patient will be able to verbalize signs and symptoms of heart failure within 90 days.  THN Long Term Goal Start Date  06/02/16 [facility] continued]      Vayas will contact patient in the month of December and patient agrees to next outreach.'  Jone Baseman, RN, MSN [place] [phone]

## 2016-06-13 ENCOUNTER — Other Ambulatory Visit: Payer: Self-pay

## 2016-06-13 ENCOUNTER — Telehealth: Payer: Self-pay | Admitting: Family Medicine

## 2016-06-13 DIAGNOSIS — I2510 Atherosclerotic heart disease of native coronary artery without angina pectoris: Secondary | ICD-10-CM

## 2016-06-13 MED ORDER — NITROGLYCERIN 0.4 MG SL SUBL: SUBLINGUAL_TABLET | SUBLINGUAL | 4 refills | Status: DC

## 2016-06-13 MED ORDER — INSULIN GLARGINE 100 UNIT/ML SOLOSTAR PEN: 17.0000 [IU] | PEN_INJECTOR | SUBCUTANEOUS | 10 refills | Status: DC

## 2016-06-13 MED ORDER — FUROSEMIDE 40 MG PO TABS: 40.0000 mg | ORAL_TABLET | Freq: Every day | ORAL | 2 refills | Status: DC

## 2016-06-13 MED ORDER — POTASSIUM CHLORIDE CRYS ER 20 MEQ PO TBCR: 20.0000 meq | EXTENDED_RELEASE_TABLET | Freq: Every day | ORAL | 11 refills | Status: DC

## 2016-06-13 MED ORDER — TIOTROPIUM BROMIDE MONOHYDRATE 18 MCG IN CAPS: 18.0000 ug | ORAL_CAPSULE | Freq: Every day | RESPIRATORY_TRACT | 6 refills | Status: DC

## 2016-06-13 MED ORDER — CLOPIDOGREL BISULFATE 75 MG PO TABS: 75.0000 mg | ORAL_TABLET | Freq: Every day | ORAL | 10 refills | Status: DC

## 2016-06-13 MED ORDER — INSULIN PEN NEEDLE 31G X 8 MM MISC: 10 refills | Status: DC

## 2016-06-13 MED ORDER — ALBUTEROL SULFATE (2.5 MG/3ML) 0.083% IN NEBU: INHALATION_SOLUTION | RESPIRATORY_TRACT | 5 refills | Status: DC

## 2016-06-13 MED ORDER — FENOFIBRATE MICRONIZED 134 MG PO CAPS: 134.0000 mg | ORAL_CAPSULE | Freq: Every day | ORAL | 3 refills | Status: DC

## 2016-06-13 MED ORDER — ROSUVASTATIN CALCIUM 40 MG PO TABS: 40.0000 mg | ORAL_TABLET | Freq: Every day | ORAL | 2 refills | Status: DC

## 2016-06-13 MED ORDER — AMLODIPINE BESYLATE 10 MG PO TABS: ORAL_TABLET | ORAL | 3 refills | Status: DC

## 2016-06-13 MED ORDER — METFORMIN HCL 1000 MG PO TABS: 1000.0000 mg | ORAL_TABLET | Freq: Two times a day (BID) | ORAL | 3 refills | Status: DC

## 2016-06-13 MED ORDER — SERTRALINE HCL 25 MG PO TABS: ORAL_TABLET | ORAL | 3 refills | Status: DC

## 2016-06-13 MED ORDER — PANTOPRAZOLE SODIUM 40 MG PO TBEC: DELAYED_RELEASE_TABLET | ORAL | 3 refills | Status: DC

## 2016-06-13 MED ORDER — FLUTICASONE-SALMETEROL 250-50 MCG/DOSE IN AEPB: INHALATION_SPRAY | RESPIRATORY_TRACT | 4 refills | Status: DC

## 2016-06-13 MED ORDER — ENALAPRIL MALEATE 20 MG PO TABS: ORAL_TABLET | ORAL | 3 refills | Status: DC

## 2016-06-13 NOTE — Telephone Encounter (Signed)
Per pt son randy his mom needs all maintenance medication send to [facility] mail order for 90 day supply w/refills. [name] does not have the list of mom medications handy

## 2016-06-13 NOTE — Telephone Encounter (Signed)
16 prescriptions sent to [facility]

## 2016-06-16 ENCOUNTER — Other Ambulatory Visit: Payer: Self-pay | Admitting: Cardiovascular Disease

## 2016-06-27 ENCOUNTER — Ambulatory Visit (INDEPENDENT_AMBULATORY_CARE_PROVIDER_SITE_OTHER): Payer: Commercial Managed Care - HMO | Admitting: Family Medicine

## 2016-06-27 ENCOUNTER — Encounter: Payer: Self-pay | Admitting: Family Medicine

## 2016-06-27 VITALS — BP 136/78 | HR 78 | Temp 97.9°F | Ht 65.0 in

## 2016-06-27 DIAGNOSIS — J0100 Acute maxillary sinusitis, unspecified: Secondary | ICD-10-CM

## 2016-06-27 DIAGNOSIS — J449 Chronic obstructive pulmonary disease, unspecified: Secondary | ICD-10-CM

## 2016-06-27 MED ORDER — PREDNISONE 20 MG PO TABS: 40.0000 mg | ORAL_TABLET | Freq: Every day | ORAL | 0 refills | Status: DC

## 2016-06-27 MED ORDER — BENZONATATE 100 MG PO CAPS: 100.0000 mg | ORAL_CAPSULE | Freq: Two times a day (BID) | ORAL | 0 refills | Status: DC | PRN

## 2016-06-27 MED ORDER — DOXYCYCLINE HYCLATE 100 MG PO CAPS: 100.0000 mg | ORAL_CAPSULE | Freq: Two times a day (BID) | ORAL | 0 refills | Status: DC

## 2016-06-27 NOTE — Patient Instructions (Signed)
I hope you feel better soon!  Take the antibiotic (doxycycline) according to instructions.  Use the tessalon as needed for cough.  Use the albuterol as needed for wheezing or resp symptoms according to instructions.  If wheezing and requiring your albuterol daily use the prednisone according to instructions.  Follow up promptly if worsening, new concerns or not improving with treatment.

## 2016-06-27 NOTE — Progress Notes (Signed)
Pre visit review using our clinic review tool, if applicable. No additional management support is needed unless otherwise documented below in the visit note. 

## 2016-06-27 NOTE — Progress Notes (Signed)
HPI:  Cough/sinus cogestion: -started: 2-3 weeks ago -symptoms:nasal congestion, sore throat, cough - now thick nasal congestion, sinus pressure, occ wheeze - has needed her albuterol a few times -denies:fever, SOB, NVD, tooth pain, CP, palpitations, weight gain, swelling -has tried: her regular COPD meds, OTC cold medications -sick contacts/travel/risks: no reported flu, strep or tick exposure - son with a cold a few weeks ago too -Hx of: COPD, CHF -reports monitors weights carefully at home and refused wt here - reports weight stable around 180 ROS: See pertinent positives and negatives per HPI.  Past Medical History:  Diagnosis Date  . Anginal pain ([place])   . Arthritis   . AV block, 1st degree   . CAD (coronary artery disease) 1990; 2015   Cardiac cath 1990 with Dr. Lia Foyer and [name] Residual CAD, non obstructive per 2015 cath. STEMI status post stent in August 2015.  Marland Kitchen COPD (chronic obstructive pulmonary disease) ([place])   . Diabetes mellitus type 2, insulin dependent ([place])   . Essential hypertension   . Hyperlipidemia with target LDL less than 70   . Myocardial infarction   . Pericarditis-post MI (short course of steroids) 03/06/2014  . 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 Shortness of breath     Past Surgical History:  Procedure Laterality Date  . CATARACT EXTRACTION  2010  . CHOLECYSTECTOMY    . CORONARY ANGIOPLASTY WITH STENT PLACEMENT  02/18/14   Promus DES to RCA  . LEFT HEART CATHETERIZATION WITH CORONARY ANGIOGRAM N/A 02/18/2014   Procedure: LEFT HEART CATHETERIZATION WITH CORONARY ANGIOGRAM;  Surgeon: Jettie Booze, MD;  Location: [facility] CATH LAB;  Service: Cardiovascular;  Laterality: N/A;  . PTCA  1990   PTCA of RCA  . TRANSTHORACIC  ECHOCARDIOGRAM  02/02/2012   mild LVH, EF 55-60%, Normal WM, Gr 1 DD; Mild MR    Family History  Problem Relation Age of Onset  . Heart attack Mother 27  . Cancer Father 74  . Heart attack Son     Social History   Social History  . Marital status: Married    Spouse name: N/A  . Number of children: 2  . Years of education: N/A   Occupational History  . Retired Retired   Social History Main Topics  . Smoking status: Former Smoker    Packs/day: 1.00    Years: 55.00    Types: Cigarettes    Quit date: 09/29/2006  . Smokeless tobacco: Never Used  . Alcohol use No  . Drug use: No  . Sexual activity: Not Asked   Other Topics Concern  . None   Social History Narrative   Lives with her son but he works all day. Home for dinner.  Independent ADL. Needs assist on some IADL.      Current Outpatient Prescriptions:  .  acetaminophen (TYLENOL) 325 MG tablet, Take 650 mg by mouth every 6 (six) hours as needed for mild pain. Reported on 10/23/2015, Disp: , Rfl:  .  albuterol (PROVENTIL) (2.5 MG/3ML) 0.083% nebulizer solution, TAKE 3 MLS (2.5 MG TOTAL) BY NEBULIZATION EVERY 6 (SIX) HOURS AS NEEDED FOR WHEEZING OR SHORTNESS OF BREATH, Disp: 75 mL, Rfl: 5 .  amLODipine (NORVASC) 10 MG tablet, TAKE 1 TABLET (10 MG  TOTAL) BY MOUTH DAILY., Disp: 90 tablet, Rfl: 3 .  aspirin 81 MG tablet, Take 81 mg by mouth daily. , Disp: , Rfl:  .  carvedilol (COREG) 12.5 MG tablet, TAKE 1 TABLET (12.5 MG TOTAL) BY MOUTH 2 (TWO) TIMES DAILY WITH A MEAL., Disp: 60 tablet, Rfl: 5 .  clopidogrel (PLAVIX) 75 MG tablet, Take 1 tablet (75 mg total) by mouth daily., Disp: 30 tablet, Rfl: 10 .  enalapril (VASOTEC) 20 MG tablet, TAKE 1 TABLET (20 MG TOTAL) BY MOUTH 2 (TWO) TIMES DAILY., Disp: 90 tablet, Rfl: 3 .  fenofibrate micronized (LOFIBRA) 134 MG capsule, Take 1 capsule (134 mg total) by mouth daily., Disp: 90 capsule, Rfl: 3 .  ferrous sulfate 325 (65 FE) MG tablet, Take 1 tablet (325 mg total) by mouth 2 (two)  times daily with a meal., Disp: 60 tablet, Rfl: 3 .  fish oil-omega-3 fatty acids 1000 MG capsule, Take 2 g by mouth daily.  , Disp: , Rfl:  .  Fluticasone-Salmeterol (ADVAIR DISKUS) 250-50 MCG/DOSE AEPB, INHALE 1 PUFF INTO THE LUNGS DAILY AS NEEDED., Disp: 60 each, Rfl: 4 .  folic acid (FOLVITE) A999333 MCG tablet, Take 400 mcg by mouth daily.  , Disp: , Rfl:  .  furosemide (LASIX) 40 MG tablet, Take 1 tablet (40 mg total) by mouth daily., Disp: 90 tablet, Rfl: 2 .  Insulin Glargine (LANTUS SOLOSTAR) 100 UNIT/ML Solostar Pen, Inject 17-20 Units into the skin every morning., Disp: 15 pen, Rfl: 10 .  Insulin Pen Needle (B-D ULTRAFINE III SHORT PEN) 31G X 8 MM MISC, USE WITH LANTUS SOLOSTAR, Disp: 100 each, Rfl: 10 .  Melatonin 5 MG CAPS, Take 1 capsule by mouth at bedtime. , Disp: , Rfl:  .  metFORMIN (GLUCOPHAGE) 1000 MG tablet, Take 1 tablet (1,000 mg total) by mouth 2 (two) times daily., Disp: 90 tablet, Rfl: 3 .  nitroGLYCERIN (NITROSTAT) 0.4 MG SL tablet, PLACE 1 TABLET (0.4 MG TOTAL) UNDER THE TONGUE EVERY 5 (FIVE) MINUTES AS NEEDED FOR CHEST PAIN (CHEST PAIN OR SHORTNESS OF BREATH)., Disp: 25 tablet, Rfl: 4 .  pantoprazole (PROTONIX) 40 MG tablet, TAKE 1 TABLET (40 MG TOTAL) BY MOUTH DAILY., Disp: 90 tablet, Rfl: 3 .  potassium chloride SA (K-DUR,KLOR-CON) 20 MEQ tablet, Take 1 tablet (20 mEq total) by mouth daily., Disp: 30 tablet, Rfl: 11 .  PROAIR HFA 108 (90 Base) MCG/ACT inhaler, INHALE 1 PUFF INTO THE LUNGS EVERY 4 (FOUR) HOURS AS NEEDED FOR WHEEZING OR SHORTNESS OF BREATH., Disp: 8.5 g, Rfl: 4 .  rosuvastatin (CRESTOR) 40 MG tablet, Take 1 tablet (40 mg total) by mouth daily., Disp: 90 tablet, Rfl: 2 .  sertraline (ZOLOFT) 25 MG tablet, TAKE 1 TABLET (25 MG TOTAL) BY MOUTH DAILY., Disp: 90 tablet, Rfl: 3 .  sodium chloride (OCEAN) 0.65 % SOLN nasal spray, Place 1 spray into both nostrils as needed for congestion., Disp: , Rfl:  .  tiotropium (SPIRIVA HANDIHALER) 18 MCG inhalation capsule,  Place 1 capsule (18 mcg total) into inhaler and inhale daily at 2 PM., Disp: 30 capsule, Rfl: 6 .  traMADol (ULTRAM) 50 MG tablet, Take 50 mg by mouth 2 (two) times daily as needed., Disp: , Rfl:  .  triamcinolone cream (KENALOG) 0.1 %, Apply 1 application topically 2 (two) times daily., Disp: 30 g, Rfl: 0 .  vitamin E 400 UNIT capsule, Take 400 Units by mouth daily.  , Disp: , Rfl:  .  benzonatate (TESSALON) 100 MG capsule, Take  1 capsule (100 mg total) by mouth 2 (two) times daily as needed for cough., Disp: 20 capsule, Rfl: 0 .  doxycycline (VIBRAMYCIN) 100 MG capsule, Take 1 capsule (100 mg total) by mouth 2 (two) times daily., Disp: 20 capsule, Rfl: 0 .  predniSONE (DELTASONE) 20 MG tablet, Take 2 tablets (40 mg total) by mouth daily with breakfast., Disp: 8 tablet, Rfl: 0  EXAM:  Vitals:   06/27/16 0944  BP: 136/78  Pulse: 78  Temp: 97.9 F (36.6 C)    There is no height or weight on file to calculate BMI.  GENERAL: vitals reviewed and listed above, alert, oriented, appears well hydrated and in no acute distress  HEENT: atraumatic, conjunttiva clear, no obvious abnormalities on inspection of external nose and ears, normal appearance of ear canals and TMs, thick nasal congestion, mild post oropharyngeal erythema with PND, no tonsillar edema or exudate, no sinus TTP  NECK: no obvious masses on inspection  LUNGS: clear to auscultation bilaterally, no wheezes, rales or rhonchi, good air movement, no resp distress, O2 98%  CV: HRRR, no peripheral edema - skinny ankles  MS: moves all extremities without noticeable abnormality  PSYCH: pleasant and cooperative, no obvious depression or anxiety  ASSESSMENT AND PLAN:  Discussed the following assessment and plan:  Acute maxillary sinusitis, recurrence not specified  Chronic obstructive pulmonary disease, unspecified COPD type (HCC) We discussed potential etiologies, with sinusitis, mild COPD exacerbation possible. No signs of  worsening HF on exam today. Opted to treat with abx, tessalon, prednisone if needed. We discussed treatment side effects, likely course, antibiotic misuse, transmission, and signs of developing a serious illness. -of course, we advised to return or notify a doctor immediately if symptoms worsen or persist or new concerns arise.    Patient Instructions  I hope you feel better soon!  Take the antibiotic (doxycycline) according to instructions.  Use the tessalon as needed for cough.  Use the albuterol as needed for wheezing or resp symptoms according to instructions.  If wheezing and requiring your albuterol daily use the prednisone according to instructions.  Follow up promptly if worsening, new concerns or not improving with treatment.   Colin Benton R., DO

## 2016-06-30 ENCOUNTER — Other Ambulatory Visit: Payer: Self-pay

## 2016-06-30 NOTE — Patient Outreach (Signed)
[facility]) Care Management  06/30/2016  Michelle [name] [birth year] [id number]   Telephone call to patient for monthly call. No answer.  HIPAA compliant voice message left.    Plan: RN Health Coach will attempt patient again in the month of December.    Jone Baseman, RN, MSN [place] [phone]

## 2016-07-06 ENCOUNTER — Other Ambulatory Visit: Payer: Self-pay | Admitting: Family Medicine

## 2016-07-12 ENCOUNTER — Other Ambulatory Visit: Payer: Self-pay

## 2016-07-12 NOTE — Patient Outreach (Signed)
[facility]) Care Management  [place]  07/12/2016   Michelle [name] 12-28-1932 [id number]  Subjective: Telephone call to patient for monthly call. Patient reports she was better she reports she had a cold she believes and went to primary office to be seen.  She reports she took antibiotics and prednisone and states she feels a lot better.  Patient reports that her sugars were fine while taking the prednisone. Patient denies knee pain.  Patient states her weights are doing about the same with recent weight at 178 lbs. Reviewed with patient signs of heart failure and when to notify physician. She verbalized understanding.    Objective:   Encounter Medications:  Outpatient Encounter Prescriptions as of 07/12/2016  Medication Sig Note  . ACCU-CHEK COMPACT PLUS test strip TEST 3 TIMES DAILY AS DIRECTED.   Marland Kitchen acetaminophen (TYLENOL) 325 MG tablet Take 650 mg by mouth every 6 (six) hours as needed for mild pain. Reported on 10/23/2015   . albuterol (PROVENTIL) (2.5 MG/3ML) 0.083% nebulizer solution TAKE 3 MLS (2.5 MG TOTAL) BY NEBULIZATION EVERY 6 (SIX) HOURS AS NEEDED FOR WHEEZING OR SHORTNESS OF BREATH   . amLODipine (NORVASC) 10 MG tablet TAKE 1 TABLET (10 MG TOTAL) BY MOUTH DAILY.   Marland Kitchen aspirin 81 MG tablet Take 81 mg by mouth daily.  12/14/2014: .  . carvedilol (COREG) 12.5 MG tablet TAKE 1 TABLET (12.5 MG TOTAL) BY MOUTH 2 (TWO) TIMES DAILY WITH A MEAL.   Marland Kitchen clopidogrel (PLAVIX) 75 MG tablet Take 1 tablet (75 mg total) by mouth daily.   . enalapril (VASOTEC) 20 MG tablet TAKE 1 TABLET (20 MG TOTAL) BY MOUTH 2 (TWO) TIMES DAILY.   . fenofibrate micronized (LOFIBRA) 134 MG capsule Take 1 capsule (134 mg total) by mouth daily.   . ferrous sulfate 325 (65 FE) MG tablet Take 1 tablet (325 mg total) by mouth 2 (two) times daily with a meal.   . fish oil-omega-3 fatty acids 1000 MG capsule Take 2 g by mouth daily.     . Fluticasone-Salmeterol (ADVAIR DISKUS) 250-50 MCG/DOSE  AEPB INHALE 1 PUFF INTO THE LUNGS DAILY AS NEEDED.   . folic acid (FOLVITE) A999333 MCG tablet Take 400 mcg by mouth daily.     . furosemide (LASIX) 40 MG tablet Take 1 tablet (40 mg total) by mouth daily.   . Insulin Glargine (LANTUS SOLOSTAR) 100 UNIT/ML Solostar Pen Inject 17-20 Units into the skin every morning.   . Insulin Pen Needle (B-D ULTRAFINE III SHORT PEN) 31G X 8 MM MISC USE WITH LANTUS SOLOSTAR   . Melatonin 5 MG CAPS Take 1 capsule by mouth at bedtime.    . metFORMIN (GLUCOPHAGE) 1000 MG tablet Take 1 tablet (1,000 mg total) by mouth 2 (two) times daily.   . nitroGLYCERIN (NITROSTAT) 0.4 MG SL tablet PLACE 1 TABLET (0.4 MG TOTAL) UNDER THE TONGUE EVERY 5 (FIVE) MINUTES AS NEEDED FOR CHEST PAIN (CHEST PAIN OR SHORTNESS OF BREATH).   Marland Kitchen pantoprazole (PROTONIX) 40 MG tablet TAKE 1 TABLET (40 MG TOTAL) BY MOUTH DAILY.   Marland Kitchen potassium chloride SA (K-DUR,KLOR-CON) 20 MEQ tablet Take 1 tablet (20 mEq total) by mouth daily.   Marland Kitchen PROAIR HFA 108 (90 Base) MCG/ACT inhaler INHALE 1 PUFF INTO THE LUNGS EVERY 4 (FOUR) HOURS AS NEEDED FOR WHEEZING OR SHORTNESS OF BREATH.   . rosuvastatin (CRESTOR) 40 MG tablet Take 1 tablet (40 mg total) by mouth daily.   . sertraline (ZOLOFT) 25 MG tablet  TAKE 1 TABLET (25 MG TOTAL) BY MOUTH DAILY.   . sodium chloride (OCEAN) 0.65 % SOLN nasal spray Place 1 spray into both nostrils as needed for congestion.   Marland Kitchen tiotropium (SPIRIVA HANDIHALER) 18 MCG inhalation capsule Place 1 capsule (18 mcg total) into inhaler and inhale daily at 2 PM.   . traMADol (ULTRAM) 50 MG tablet Take 50 mg by mouth 2 (two) times daily as needed.   . triamcinolone cream (KENALOG) 0.1 % Apply 1 application topically 2 (two) times daily.   . vitamin E 400 UNIT capsule Take 400 Units by mouth daily.     . benzonatate (TESSALON) 100 MG capsule Take 1 capsule (100 mg total) by mouth 2 (two) times daily as needed for cough. (Patient not taking: Reported on 07/12/2016)   . doxycycline (VIBRAMYCIN) 100  MG capsule Take 1 capsule (100 mg total) by mouth 2 (two) times daily. (Patient not taking: Reported on 07/12/2016)   . predniSONE (DELTASONE) 20 MG tablet Take 2 tablets (40 mg total) by mouth daily with breakfast. (Patient not taking: Reported on 07/12/2016)    No facility-administered encounter medications on file as of 07/12/2016.     Functional Status:  In your present state of health, do you have any difficulty performing the following activities: 07/29/2015  Hearing? N  Vision? N  Difficulty concentrating or making decisions? N  Walking or climbing stairs? Y  Dressing or bathing? Y  Doing errands, shopping? Y  Preparing Food and eating ? N  Using the Toilet? N  In the past six months, have you accidently leaked urine? N  Do you have problems with loss of bowel control? N  Managing your Medications? Y  Managing your Finances? Y  Housekeeping or managing your Housekeeping? Y  Some recent data might be hidden    Fall/Depression Screening: PHQ 2/9 Scores 07/12/2016 06/02/2016 05/09/2016 04/13/2016 12/17/2015 10/21/2015 09/23/2015  PHQ - 2 Score 0 0 0 0 0 0 0    Assessment: Patient continues to benefit from health coach outreach for disease management and support.    Plan:  [facility] CM Care Plan Problem Two   Flowsheet Row Most Recent Value  Care Plan Problem Two  knowledge deficit heart failure  Role Documenting the Problem Two  [place] for Problem Two  Active  Interventions for Problem Two Long Term Goal   RN Health Coach reiterated signs and symptoms of heart failure with patient.  [facility] Long Term Goal (31-90) days  Patient will be able to verbalize signs and symptoms of heart failure within 90 days.  [facility] Long Term Goal Start Date  07/12/16 [facility] continued]     [place] will contact patient in the month of January and patient agrees to next outreach.  Jone Baseman, RN, MSN [employer] [phone]

## 2016-07-22 ENCOUNTER — Encounter: Payer: Self-pay | Admitting: Family Medicine

## 2016-07-22 ENCOUNTER — Ambulatory Visit (INDEPENDENT_AMBULATORY_CARE_PROVIDER_SITE_OTHER): Payer: Commercial Managed Care - HMO | Admitting: Family Medicine

## 2016-07-22 VITALS — BP 140/64 | HR 85 | Temp 98.1°F | Ht 65.0 in | Wt 179.0 lb

## 2016-07-22 DIAGNOSIS — Z794 Long term (current) use of insulin: Secondary | ICD-10-CM | POA: Diagnosis not present

## 2016-07-22 DIAGNOSIS — D509 Iron deficiency anemia, unspecified: Secondary | ICD-10-CM

## 2016-07-22 DIAGNOSIS — I5032 Chronic diastolic (congestive) heart failure: Secondary | ICD-10-CM

## 2016-07-22 DIAGNOSIS — J449 Chronic obstructive pulmonary disease, unspecified: Secondary | ICD-10-CM | POA: Diagnosis not present

## 2016-07-22 DIAGNOSIS — I25118 Atherosclerotic heart disease of native coronary artery with other forms of angina pectoris: Secondary | ICD-10-CM

## 2016-07-22 DIAGNOSIS — I10 Essential (primary) hypertension: Secondary | ICD-10-CM | POA: Diagnosis not present

## 2016-07-22 DIAGNOSIS — E119 Type 2 diabetes mellitus without complications: Secondary | ICD-10-CM

## 2016-07-22 DIAGNOSIS — M170 Bilateral primary osteoarthritis of knee: Secondary | ICD-10-CM

## 2016-07-22 LAB — CBC
HEMATOCRIT: 40.4 % (ref 35.0–45.0)
HEMOGLOBIN: 13.6 g/dL (ref 11.7–15.5)
MCH: 28.7 pg (ref 27.0–33.0)
MCHC: 33.7 g/dL (ref 32.0–36.0)
MCV: 85.2 fL (ref 80.0–100.0)
MPV: 12.7 fL — ABNORMAL HIGH (ref 7.5–12.5)
Platelets: 228 10*3/uL (ref 140–400)
RBC: 4.74 MIL/uL (ref 3.80–5.10)
RDW: 14.3 % (ref 11.0–15.0)
WBC: 6.7 10*3/uL (ref 3.8–10.8)

## 2016-07-22 LAB — FERRITIN: Ferritin: 91 ng/mL (ref 20–288)

## 2016-07-22 MED ORDER — TRAMADOL HCL 50 MG PO TABS: 50.0000 mg | ORAL_TABLET | Freq: Three times a day (TID) | ORAL | 5 refills | Status: DC | PRN

## 2016-07-22 NOTE — Progress Notes (Signed)
Subjective:  Michelle [name] is a 83 y.o. year old very pleasant female patient who presents for/with See problem oriented charting ROS- intermittent chest pain- relieved by nitroglycerin, shortness of breath at baseline, no edema. No abdominal pain   Past Medical History-  Patient Active Problem List   Diagnosis Date Noted  . Chronic diastolic CHF (congestive heart failure) ([place]) 03/13/2014    Priority: High  . CAD- RCA PCI '90s, STEMI-RCA DES 02/18/14 03/05/2014    Priority: High  . IDDM type 2 03/26/2007    Priority: High  . Anemia, iron deficiency 10/23/2015    Priority: Medium  . Osteoarthritis, knee 05/13/2014    Priority: Medium  . Anxiety state 04/21/2014    Priority: Medium  . SOB (shortness of breath) 03/05/2014    Priority: Medium  . Hyperlipidemia with target LDL less than 70 03/26/2007    Priority: Medium  . Essential hypertension 04/21/2006    Priority: Medium  . COPD (chronic obstructive pulmonary disease) ([place]) 04/21/2006    Priority: Medium  . CKD (chronic kidney disease), stage II 07/25/2014    Priority: Low  . Back pain, lumbosacral 10/10/2012    Priority: Low  . Chest pain 12/14/2014    Medications- reviewed and updated Current Outpatient Prescriptions  Medication Sig Dispense Refill  . ACCU-CHEK COMPACT PLUS test strip TEST 3 TIMES DAILY AS DIRECTED. 102 each 5  . acetaminophen (TYLENOL) 325 MG tablet Take 650 mg by mouth every 6 (six) hours as needed for mild pain. Reported on 10/23/2015    . albuterol (PROVENTIL) (2.5 MG/3ML) 0.083% nebulizer solution TAKE 3 MLS (2.5 MG TOTAL) BY NEBULIZATION EVERY 6 (SIX) HOURS AS NEEDED FOR WHEEZING OR SHORTNESS OF BREATH 75 mL 5  . amLODipine (NORVASC) 10 MG tablet TAKE 1 TABLET (10 MG TOTAL) BY MOUTH DAILY. 90 tablet 3  . aspirin 81 MG tablet Take 81 mg by mouth daily.     . carvedilol (COREG) 12.5 MG tablet TAKE 1 TABLET (12.5 MG TOTAL) BY MOUTH 2 (TWO) TIMES DAILY WITH A MEAL. 60 tablet 5  . clopidogrel (PLAVIX) 75  MG tablet Take 1 tablet (75 mg total) by mouth daily. 30 tablet 10  . enalapril (VASOTEC) 20 MG tablet TAKE 1 TABLET (20 MG TOTAL) BY MOUTH 2 (TWO) TIMES DAILY. 90 tablet 3  . fenofibrate micronized (LOFIBRA) 134 MG capsule Take 1 capsule (134 mg total) by mouth daily. 90 capsule 3  . ferrous sulfate 325 (65 FE) MG tablet Take 1 tablet (325 mg total) by mouth 2 (two) times daily with a meal. 60 tablet 3  . fish oil-omega-3 fatty acids 1000 MG capsule Take 2 g by mouth daily.      . Fluticasone-Salmeterol (ADVAIR DISKUS) 250-50 MCG/DOSE AEPB INHALE 1 PUFF INTO THE LUNGS DAILY AS NEEDED. 60 each 4  . folic acid (FOLVITE) A999333 MCG tablet Take 400 mcg by mouth daily.      . furosemide (LASIX) 40 MG tablet Take 1 tablet (40 mg total) by mouth daily. 90 tablet 2  . Insulin Glargine (LANTUS SOLOSTAR) 100 UNIT/ML Solostar Pen Inject 17-20 Units into the skin every morning. 15 pen 10  . Insulin Pen Needle (B-D ULTRAFINE III SHORT PEN) 31G X 8 MM MISC USE WITH LANTUS SOLOSTAR 100 each 10  . Melatonin 5 MG CAPS Take 1 capsule by mouth at bedtime.     . metFORMIN (GLUCOPHAGE) 1000 MG tablet Take 1 tablet (1,000 mg total) by mouth 2 (two) times daily. 90 tablet  3  . nitroGLYCERIN (NITROSTAT) 0.4 MG SL tablet PLACE 1 TABLET (0.4 MG TOTAL) UNDER THE TONGUE EVERY 5 (FIVE) MINUTES AS NEEDED FOR CHEST PAIN (CHEST PAIN OR SHORTNESS OF BREATH). 25 tablet 4  . pantoprazole (PROTONIX) 40 MG tablet TAKE 1 TABLET (40 MG TOTAL) BY MOUTH DAILY. 90 tablet 3  . potassium chloride SA (K-DUR,KLOR-CON) 20 MEQ tablet Take 1 tablet (20 mEq total) by mouth daily. 30 tablet 11  . PROAIR HFA 108 (90 Base) MCG/ACT inhaler INHALE 1 PUFF INTO THE LUNGS EVERY 4 (FOUR) HOURS AS NEEDED FOR WHEEZING OR SHORTNESS OF BREATH. 8.5 g 4  . rosuvastatin (CRESTOR) 40 MG tablet Take 1 tablet (40 mg total) by mouth daily. 90 tablet 2  . sertraline (ZOLOFT) 25 MG tablet TAKE 1 TABLET (25 MG TOTAL) BY MOUTH DAILY. 90 tablet 3  . sodium chloride (OCEAN)  0.65 % SOLN nasal spray Place 1 spray into both nostrils as needed for congestion.    Marland Kitchen tiotropium (SPIRIVA HANDIHALER) 18 MCG inhalation capsule Place 1 capsule (18 mcg total) into inhaler and inhale daily at 2 PM. 30 capsule 6  . traMADol (ULTRAM) 50 MG tablet Take 1 tablet (50 mg total) by mouth 3 (three) times daily as needed. 60 tablet 5  . triamcinolone cream (KENALOG) 0.1 % Apply 1 application topically 2 (two) times daily. 30 g 0  . vitamin E 400 UNIT capsule Take 400 Units by mouth daily.       No current facility-administered medications for this visit.     Objective: BP 140/64   Pulse 85   Temp 98.1 F (36.7 C) (Oral)   Ht 5\' 5"  (1.651 m)   Wt 179 lb (81.2 kg)   SpO2 97%   BMI 29.79 kg/m  Gen: NAD, resting comfortably in wheelchair- able to walk but prefers wheelchair in our office CV: RRR no murmurs rubs or gallops Lungs: CTAB no crackles, wheeze, rhonchi, prolonged expiratory phase Abdomen: soft/nontender. obese  Ext: no edema Skin: warm, dry  Assessment/Plan:  Essential hypertension S: mild poorly controlled on lasix 40mg , enalapril 20mg  BID, coreg 12.5mg  BID.  BP Readings from Last 3 Encounters:  07/22/16 140/64  06/27/16 136/78  04/15/16 (!) 150/62  A/P:Continue current meds:  Would prefer to be under 140 but was there at last visit. Patient is also concerned about taking more medicine so we opted not to make changes today  IDDM type 2 S:  controlled. On lantus 18 units, metformin 2g Daily CBGs- 120 to 160 Exercise and diet- exercising not at all. Trying to watch her foods Lab Results  Component Value Date   HGBA1C 7.0 (H) 07/22/2016   HGBA1C 6.5 03/07/2016   HGBA1C 7.0 (H) 10/23/2015   A/P: a1c reasonable and had prednisone in recent weeks. Continue current medicines  COPD (chronic obstructive pulmonary disease) (HCC) S: compliant with spiriva and advair. Recent sinusitis and treated with doxycyline and prednisone- has some lingering cough and wheeze  using albuterol most mornings.  A/P:a1c is reasonable but since symptoms are improving did not want to repeat prednisone at this time unless worsens again in regards to cough/wheeze. SOB appears at baseline.    Chronic diastolic CHF (congestive heart failure) S: weight stable. No worsening edema. No worsening shortness of breath. Compliant with 40mg  lasix.  Wt Readings from Last 3 Encounters:  07/22/16 179 lb (81.2 kg)  04/15/16 180 lb (81.6 kg)  10/23/15 181 lb (82.1 kg)  A/P: continue current medicine- she is doing a great  job monitoring weight and symptoms at home.   Osteoarthritis, knee S: severe pain in knees. Ran out of tramadol and started with JR watkins menthol and camphor spray. Has really helped her manage until she could get in today- states the tramadol helps more but it has at least been tolerable A/P: refilled tramadol- apparently she has had more numbness/tingling/pain in her feet since coming off tramadol and advised perhaps trial just at night then use her spray during the day.    CAD- RCA PCI '90s, STEMI-RCA DES 02/18/14 S: has had some sharp pains in her chest that last only briefly. Continues to have some central chest pain about once a month that she uses nitroglycerin for and relieves. She remains on effient, aspirin, nitroglycerin, crestor A/P: continue current medicine, stable angina pattern  3-4 months CPE, consider awv with Manuela Schwartz at some point  Orders Placed This Encounter  Procedures  . Comprehensive metabolic panel    [place]    Standing Status:   Future    Number of Occurrences:   1    Standing Expiration Date:   07/22/2017  . LDL cholesterol, direct    [place]    Standing Status:   Future    Number of Occurrences:   1    Standing Expiration Date:   07/22/2017  . Hemoglobin A1c    [place]    Standing Status:   Future    Number of Occurrences:   1    Standing Expiration Date:   07/22/2017  . Ferritin    Standing Status:   Future    Number of Occurrences:    1    Standing Expiration Date:   07/22/2017  . CBC    [place]    Standing Status:   Future    Number of Occurrences:   1    Standing Expiration Date:   07/22/2017    Meds ordered this encounter  Medications  . traMADol (ULTRAM) 50 MG tablet    Sig: Take 1 tablet (50 mg total) by mouth 3 (three) times daily as needed.    Dispense:  60 tablet    Refill:  5   Return precautions advised.  [name], MD

## 2016-07-22 NOTE — Progress Notes (Signed)
Pre visit review using our clinic review tool, if applicable. No additional management support is needed unless otherwise documented below in the visit note. 

## 2016-07-22 NOTE — Patient Instructions (Addendum)
Labs before you leave  Refilled tramadol  Follow up in 3-4 months. You get a physical once a year which may be lower cost for you so would schedule this visit at physical (not annual wellness visit)

## 2016-07-23 LAB — COMPREHENSIVE METABOLIC PANEL
ALT: 14 U/L (ref 6–29)
AST: 16 U/L (ref 10–35)
Albumin: 4.4 g/dL (ref 3.6–5.1)
Alkaline Phosphatase: 54 U/L (ref 33–130)
BUN: 20 mg/dL (ref 7–25)
CALCIUM: 9.5 mg/dL (ref 8.6–10.4)
CHLORIDE: 101 mmol/L (ref 98–110)
CO2: 25 mmol/L (ref 20–31)
Creat: 0.74 mg/dL (ref 0.60–0.88)
Glucose, Bld: 129 mg/dL — ABNORMAL HIGH (ref 65–99)
POTASSIUM: 4 mmol/L (ref 3.5–5.3)
Sodium: 137 mmol/L (ref 135–146)
Total Bilirubin: 0.3 mg/dL (ref 0.2–1.2)
Total Protein: 7.2 g/dL (ref 6.1–8.1)

## 2016-07-23 LAB — HEMOGLOBIN A1C
HEMOGLOBIN A1C: 7 % — AB (ref <5.7)
MEAN PLASMA GLUCOSE: 154 mg/dL

## 2016-07-23 LAB — LDL CHOLESTEROL, DIRECT: Direct LDL: 45 mg/dL (ref <130)

## 2016-07-23 NOTE — Assessment & Plan Note (Signed)
S: mild poorly controlled on lasix 40mg , enalapril 20mg  BID, coreg 12.5mg  BID.  BP Readings from Last 3 Encounters:  07/22/16 140/64  06/27/16 136/78  04/15/16 (!) 150/62  A/P:Continue current meds:  Would prefer to be under 140 but was there at last visit. Patient is also concerned about taking more medicine so we opted not to make changes today

## 2016-07-23 NOTE — Assessment & Plan Note (Signed)
S: has had some sharp pains in her chest that last only briefly. Continues to have some central chest pain about once a month that she uses nitroglycerin for and relieves. She remains on effient, aspirin, nitroglycerin, crestor A/P: continue current medicine, stable angina pattern

## 2016-07-23 NOTE — Assessment & Plan Note (Signed)
S: weight stable. No worsening edema. No worsening shortness of breath. Compliant with 40mg  lasix.  Wt Readings from Last 3 Encounters:  07/22/16 179 lb (81.2 kg)  04/15/16 180 lb (81.6 kg)  10/23/15 181 lb (82.1 kg)  A/P: continue current medicine- she is doing a great job monitoring weight and symptoms at home.

## 2016-07-23 NOTE — Assessment & Plan Note (Signed)
S: compliant with spiriva and advair. Recent sinusitis and treated with doxycyline and prednisone- has some lingering cough and wheeze using albuterol most mornings.  A/P:a1c is reasonable but since symptoms are improving did not want to repeat prednisone at this time unless worsens again in regards to cough/wheeze. SOB appears at baseline.

## 2016-07-23 NOTE — Assessment & Plan Note (Signed)
S: severe pain in knees. Ran out of tramadol and started with JR watkins menthol and camphor spray. Has really helped her manage until she could get in today- states the tramadol helps more but it has at least been tolerable A/P: refilled tramadol- apparently she has had more numbness/tingling/pain in her feet since coming off tramadol and advised perhaps trial just at night then use her spray during the day.

## 2016-07-23 NOTE — Assessment & Plan Note (Signed)
S:  controlled. On lantus 18 units, metformin 2g Daily CBGs- 120 to 160 Exercise and diet- exercising not at all. Trying to watch her foods Lab Results  Component Value Date   HGBA1C 7.0 (H) 07/22/2016   HGBA1C 6.5 03/07/2016   HGBA1C 7.0 (H) 10/23/2015   A/P: a1c reasonable and had prednisone in recent weeks. Continue current medicines

## 2016-08-05 ENCOUNTER — Other Ambulatory Visit: Payer: Self-pay

## 2016-08-05 NOTE — Patient Outreach (Signed)
[facility]) Care Management  08/05/2016  Michelle [name] Mar 02, 1933 [id number]   Telephone call to patient for monthly call.  No answer.  Unable to leave a message.  Plan: RN Health Coach will attempt patient again in the month of January.   Jone Baseman, RN, MSN [place] [phone]

## 2016-08-11 ENCOUNTER — Other Ambulatory Visit: Payer: Self-pay

## 2016-08-11 NOTE — Patient Outreach (Signed)
[facility]) Care Management  08/11/2016  [name] [birth year] [id number]   2nd Telephone call to patient for monthly call.  No answer. Unable to leave a message.  Plan: RN Health Coach will attempt patient in the month of February.   Jone Baseman, RN, MSN [place] [phone]

## 2016-08-30 ENCOUNTER — Other Ambulatory Visit: Payer: Self-pay

## 2016-08-30 ENCOUNTER — Ambulatory Visit: Payer: Self-pay

## 2016-08-30 NOTE — Patient Outreach (Signed)
[place]) Care Management  08/30/2016  [name] [birth year] [id number]   3rd Telephone call to patient for monthly call.  No answer. Unable to leave a message.  Plan: RN Health Coach will send letter to attempt outreach.    Jone Baseman, RN, MSN [place] [phone]

## 2016-09-06 ENCOUNTER — Ambulatory Visit: Payer: Self-pay

## 2016-09-14 ENCOUNTER — Other Ambulatory Visit: Payer: Self-pay

## 2016-09-14 NOTE — Patient Outreach (Signed)
[facility]) Care Management  09/14/2016  Michelle [name] 09/04/1932 [id number]   Patient has not responded to calls or letter.  Will proceed with case closure.  Will notify care management assistant of case closure.  Jone Baseman, RN, MSN [place] [phone]

## 2016-09-22 ENCOUNTER — Other Ambulatory Visit: Payer: Self-pay | Admitting: Family Medicine

## 2016-10-09 ENCOUNTER — Encounter: Payer: Self-pay | Admitting: Family Medicine

## 2016-10-10 ENCOUNTER — Other Ambulatory Visit: Payer: Self-pay | Admitting: Family Medicine

## 2016-10-10 MED ORDER — ROSUVASTATIN CALCIUM 40 MG PO TABS: 40.0000 mg | ORAL_TABLET | Freq: Every day | ORAL | 2 refills | Status: DC

## 2016-10-10 MED ORDER — CARVEDILOL 12.5 MG PO TABS: ORAL_TABLET | ORAL | 4 refills | Status: DC

## 2016-10-11 ENCOUNTER — Other Ambulatory Visit: Payer: Self-pay

## 2016-10-11 MED ORDER — FLUTICASONE-SALMETEROL 250-50 MCG/DOSE IN AEPB: INHALATION_SPRAY | RESPIRATORY_TRACT | 4 refills | Status: DC

## 2016-10-21 ENCOUNTER — Ambulatory Visit (INDEPENDENT_AMBULATORY_CARE_PROVIDER_SITE_OTHER): Payer: Medicare HMO | Admitting: Family Medicine

## 2016-10-21 ENCOUNTER — Encounter: Payer: Self-pay | Admitting: Family Medicine

## 2016-10-21 VITALS — BP 138/62 | HR 78 | Temp 98.1°F | Ht 65.0 in | Wt 182.0 lb

## 2016-10-21 DIAGNOSIS — I10 Essential (primary) hypertension: Secondary | ICD-10-CM

## 2016-10-21 DIAGNOSIS — Z794 Long term (current) use of insulin: Secondary | ICD-10-CM | POA: Diagnosis not present

## 2016-10-21 DIAGNOSIS — J449 Chronic obstructive pulmonary disease, unspecified: Secondary | ICD-10-CM

## 2016-10-21 DIAGNOSIS — E119 Type 2 diabetes mellitus without complications: Secondary | ICD-10-CM

## 2016-10-21 LAB — POCT GLYCOSYLATED HEMOGLOBIN (HGB A1C): HEMOGLOBIN A1C: 6.2

## 2016-10-21 MED ORDER — ALBUTEROL SULFATE HFA 108 (90 BASE) MCG/ACT IN AERS: INHALATION_SPRAY | RESPIRATORY_TRACT | 11 refills | Status: DC

## 2016-10-21 NOTE — Assessment & Plan Note (Signed)
S: compliant with advair BID and spiriva daily. Uses albuterol nebulizer when needed at home but running out of inhalers for when she is out of home A/P: doing well, refilled albuterol

## 2016-10-21 NOTE — Progress Notes (Signed)
Pre visit review using our clinic review tool, if applicable. No additional management support is needed unless otherwise documented below in the visit note. 

## 2016-10-21 NOTE — Assessment & Plan Note (Signed)
S: well controlled. On lantus 18 units daily, metformin 2 g daily CBGs- last visit 120-160, now 101-150 Exercise and diet- not exercising. Some poor dietary choices but states doesn't have a taste fo rmuch  Lab Results  Component Value Date   HGBA1C 6.2 10/21/2016   HGBA1C 7.0 (H) 07/22/2016   HGBA1C 6.5 03/07/2016   A/P: a1c has improved from last visit. May be due to low diet. Will have to monitor for any low CBGs. Has had some diarrhea at times and discussed option of metformin 500mg  BID instead of 1000mg  BID- continue lantus at same 18 units fornow  Son is going to help her get eye exam

## 2016-10-21 NOTE — Assessment & Plan Note (Signed)
S: controlled on Enalapril 20mg  BID, Lasix 40mg , coreg 12.5mg  BID, Amlodipine 10mg .  BP Readings from Last 3 Encounters:  10/21/16 138/62  07/22/16 140/64  06/27/16 136/78  A/P:Continue current meds:  Lasix also keeping fluid status stable

## 2016-10-21 NOTE — Progress Notes (Signed)
Subjective:  Michelle [name] is a 83 y.o. year old very pleasant female patient who presents for/with See problem oriented charting ROS- stable shortness of breath. Occasional wheeze and increase SOB helped by albuterol. No chest pain at present. No edema.    Past Medical History-  Patient Active Problem List   Diagnosis Date Noted  . Chronic diastolic CHF (congestive heart failure) ([place]) 03/13/2014    Priority: High  . CAD- RCA PCI '90s, STEMI-RCA DES 02/18/14 03/05/2014    Priority: High  . IDDM type 2 03/26/2007    Priority: High  . Anemia, iron deficiency 10/23/2015    Priority: Medium  . Osteoarthritis, knee 05/13/2014    Priority: Medium  . Anxiety state 04/21/2014    Priority: Medium  . SOB (shortness of breath) 03/05/2014    Priority: Medium  . Hyperlipidemia with target LDL less than 70 03/26/2007    Priority: Medium  . Essential hypertension 04/21/2006    Priority: Medium  . COPD (chronic obstructive pulmonary disease) ([place]) 04/21/2006    Priority: Medium  . CKD (chronic kidney disease), stage II 07/25/2014    Priority: Low  . Back pain, lumbosacral 10/10/2012    Priority: Low  . Chest pain 12/14/2014    Medications- reviewed and updated Current Outpatient Prescriptions  Medication Sig Dispense Refill  . ACCU-CHEK COMPACT PLUS test strip TEST 3 TIMES DAILY AS DIRECTED. 102 each 5  . acetaminophen (TYLENOL) 325 MG tablet Take 650 mg by mouth every 6 (six) hours as needed for mild pain. Reported on 10/23/2015    . albuterol (PROAIR HFA) 108 (90 Base) MCG/ACT inhaler INHALE 1 PUFF INTO THE LUNGS EVERY 4 (FOUR) HOURS AS NEEDED FOR WHEEZING OR SHORTNESS OF BREATH. 8.5 g 11  . albuterol (PROVENTIL) (2.5 MG/3ML) 0.083% nebulizer solution TAKE 3 MLS (2.5 MG TOTAL) BY NEBULIZATION EVERY 6 (SIX) HOURS AS NEEDED FOR WHEEZING OR SHORTNESS OF BREATH 75 mL 5  . amLODipine (NORVASC) 10 MG tablet TAKE 1 TABLET (10 MG TOTAL) BY MOUTH DAILY. 90 tablet 3  . aspirin 81 MG tablet Take 81  mg by mouth daily.     . carvedilol (COREG) 12.5 MG tablet TAKE 1 TABLET BY MOUTH TWICE DAILY WITH A MEAL 180 tablet 4  . clopidogrel (PLAVIX) 75 MG tablet Take 1 tablet (75 mg total) by mouth daily. 30 tablet 10  . enalapril (VASOTEC) 20 MG tablet TAKE 1 TABLET (20 MG TOTAL) BY MOUTH 2 (TWO) TIMES DAILY. 90 tablet 3  . fenofibrate micronized (LOFIBRA) 134 MG capsule Take 1 capsule (134 mg total) by mouth daily. 90 capsule 3  . ferrous sulfate 325 (65 FE) MG tablet Take 1 tablet (325 mg total) by mouth 2 (two) times daily with a meal. 60 tablet 3  . fish oil-omega-3 fatty acids 1000 MG capsule Take 2 g by mouth daily.      . Fluticasone-Salmeterol (ADVAIR DISKUS) 250-50 MCG/DOSE AEPB INHALE 1 PUFF INTO THE LUNGS DAILY AS NEEDED. 60 each 4  . folic acid (FOLVITE) 888 MCG tablet Take 400 mcg by mouth daily.      . furosemide (LASIX) 40 MG tablet Take 1 tablet (40 mg total) by mouth daily. 90 tablet 2  . Insulin Glargine (LANTUS SOLOSTAR) 100 UNIT/ML Solostar Pen Inject 17-20 Units into the skin every morning. 15 pen 10  . Insulin Pen Needle (B-D ULTRAFINE III SHORT PEN) 31G X 8 MM MISC USE WITH LANTUS SOLOSTAR 100 each 10  . Melatonin 5 MG CAPS Take  1 capsule by mouth at bedtime.     . metFORMIN (GLUCOPHAGE) 1000 MG tablet Take 1 tablet (1,000 mg total) by mouth 2 (two) times daily. 90 tablet 3  . nitroGLYCERIN (NITROSTAT) 0.4 MG SL tablet PLACE 1 TABLET (0.4 MG TOTAL) UNDER THE TONGUE EVERY 5 (FIVE) MINUTES AS NEEDED FOR CHEST PAIN (CHEST PAIN OR SHORTNESS OF BREATH). 25 tablet 4  . pantoprazole (PROTONIX) 40 MG tablet TAKE 1 TABLET (40 MG TOTAL) BY MOUTH DAILY. 90 tablet 3  . potassium chloride SA (K-DUR,KLOR-CON) 20 MEQ tablet Take 1 tablet (20 mEq total) by mouth daily. 30 tablet 11  . rosuvastatin (CRESTOR) 40 MG tablet Take 1 tablet (40 mg total) by mouth daily. 90 tablet 2  . sertraline (ZOLOFT) 25 MG tablet TAKE 1 TABLET (25 MG TOTAL) BY MOUTH DAILY. 90 tablet 3  . sodium chloride (OCEAN)  0.65 % SOLN nasal spray Place 1 spray into both nostrils as needed for congestion.    Marland Kitchen tiotropium (SPIRIVA HANDIHALER) 18 MCG inhalation capsule Place 1 capsule (18 mcg total) into inhaler and inhale daily at 2 PM. 30 capsule 6  . traMADol (ULTRAM) 50 MG tablet Take 1 tablet (50 mg total) by mouth 3 (three) times daily as needed. 60 tablet 5  . triamcinolone cream (KENALOG) 0.1 % Apply 1 application topically 2 (two) times daily. 30 g 0  . vitamin E 400 UNIT capsule Take 400 Units by mouth daily.       No current facility-administered medications for this visit.     Objective: BP 138/62 (BP Location: Left Arm, Patient Position: Sitting, Cuff Size: Normal)   Pulse 78   Temp 98.1 F (36.7 C) (Oral)   Ht 5\' 5"  (1.651 m)   Wt 182 lb (82.6 kg)   SpO2 97%   BMI 30.29 kg/m  Gen: NAD, resting comfortably, appears older than stated age CV: RRR no murmurs rubs or gallops Lungs: CTAB no crackles, wheeze, rhonchi Abdomen: soft/nontender/nondistended/normal bowel sounds. No rebound or guarding.  Ext: no edema Skin: warm, dry Neuro: in wheelchair- son pushes her in  Assessment/Plan:  IDDM type 2 S: well controlled. On lantus 18 units daily, metformin 2 g daily CBGs- last visit 120-160, now 101-150 Exercise and diet- not exercising. Some poor dietary choices but states doesn't have a taste fo rmuch  Lab Results  Component Value Date   HGBA1C 6.2 10/21/2016   HGBA1C 7.0 (H) 07/22/2016   HGBA1C 6.5 03/07/2016   A/P: a1c has improved from last visit. May be due to low diet. Will have to monitor for any low CBGs. Has had some diarrhea at times and discussed option of metformin 500mg  BID instead of 1000mg  BID- continue lantus at same 18 units fornow  Son is going to help her get eye exam  COPD (chronic obstructive pulmonary disease) ([place]) S: compliant with advair BID and spiriva daily. Uses albuterol nebulizer when needed at home but running out of inhalers for when she is out of home A/P:  doing well, refilled albuterol   Essential hypertension S: controlled on Enalapril 20mg  BID, Lasix 40mg , coreg 12.5mg  BID, Amlodipine 10mg .  BP Readings from Last 3 Encounters:  10/21/16 138/62  07/22/16 140/64  06/27/16 136/78  A/P:Continue current meds:  Lasix also keeping fluid status stable    Return in about 4 months (around 02/20/2017) for annual wellness visit with Wynetta Fines, RN then see me same day.  Orders Placed This Encounter  Procedures  . POCT glycosylated hemoglobin (Hb A1C)  Meds ordered this encounter  Medications  . albuterol (PROAIR HFA) 108 (90 Base) MCG/ACT inhaler    Sig: INHALE 1 PUFF INTO THE LUNGS EVERY 4 (FOUR) HOURS AS NEEDED FOR WHEEZING OR SHORTNESS OF BREATH.    Dispense:  8.5 g    Refill:  11    Return precautions advised.  [name], MD

## 2016-10-21 NOTE — Patient Instructions (Signed)
Lab Results  Component Value Date   HGBA1C 6.2 10/21/2016  Diabetes looks great! Continue great job  No other changes

## 2016-10-22 ENCOUNTER — Other Ambulatory Visit: Payer: Self-pay | Admitting: Family Medicine

## 2016-10-25 ENCOUNTER — Telehealth: Payer: Self-pay | Admitting: Family Medicine

## 2016-10-25 NOTE — Telephone Encounter (Signed)
Yes thanks, ok to change to proventil with same instructions as proair

## 2016-10-25 NOTE — Telephone Encounter (Signed)
The pro air to expensive and pharm would like to approval for proventil inhaler instead

## 2016-10-26 ENCOUNTER — Other Ambulatory Visit: Payer: Self-pay

## 2016-10-26 DIAGNOSIS — R609 Edema, unspecified: Secondary | ICD-10-CM

## 2016-10-26 MED ORDER — ALBUTEROL SULFATE HFA 108 (90 BASE) MCG/ACT IN AERS
1.0000 | INHALATION_SPRAY | RESPIRATORY_TRACT | 0 refills | Status: DC | PRN
Start: 2016-10-26 — End: 2019-07-23

## 2016-10-26 NOTE — Telephone Encounter (Signed)
Prescription sent to pharmacy as requested.

## 2016-11-08 ENCOUNTER — Other Ambulatory Visit: Payer: Self-pay | Admitting: Family Medicine

## 2016-12-14 ENCOUNTER — Encounter: Payer: Self-pay | Admitting: Cardiology

## 2016-12-14 ENCOUNTER — Ambulatory Visit (INDEPENDENT_AMBULATORY_CARE_PROVIDER_SITE_OTHER): Payer: Medicare HMO | Admitting: Cardiology

## 2016-12-14 VITALS — BP 136/78 | HR 87 | Ht 65.0 in | Wt 183.0 lb

## 2016-12-14 DIAGNOSIS — I2510 Atherosclerotic heart disease of native coronary artery without angina pectoris: Secondary | ICD-10-CM | POA: Diagnosis not present

## 2016-12-14 DIAGNOSIS — Z79899 Other long term (current) drug therapy: Secondary | ICD-10-CM

## 2016-12-14 NOTE — Patient Instructions (Signed)
Medication Instructions:   Your physician recommends that you continue on your current medications as directed. Please refer to the Current Medication list given to you today.  If you need a refill on your cardiac medications before your next appointment, please call your pharmacy.  Labwork:  CBC AND BMET     Testing/Procedures: NONE ORDERED  TODAY    Follow-Up: Your physician wants you to follow-up in:  IN  [place] will receive a reminder letter in the mail two months in advance. If you don't receive a letter, please call our office to schedule the follow-up appointment.     Any Other Special Instructions Will Be Listed Below (If Applicable).

## 2016-12-14 NOTE — Progress Notes (Signed)
12/14/2016 CARYN GIENGER   1932/12/29  [id number]  Primary Physician [name], MD Primary Cardiologist: Dr. Angelena Form    Reason for Visit/CC: 6 month f/u for CAD   HPI:  Michelle [name] is a 83 y.o. female with a history of CAD status post PTCA of a large dominant RCA in 1990 as well as a history of inferior STEMI in August 2015 where emergent cardiac catheterization demonstrated an occluded RCA with aneurysm formation at the prior angioplasty site. The aneurysm was treated with a covered stent. The occlusion was treated with DES 2. She also has a history of chronic diastolic heart failure, diabetes, hypertension, hyperlipidemia, COPD and prior history of tobacco use. In May 2016 she was readmitted for chest pain and ruled out for MI with negative cardiac enzymes. Her pain at that time was felt to be muscularskeletal.  She has been followed by Dr. Angelena Form. She presents to clinic today for 6 month follow-up. She reports that she has done well. No recurrent CP or dyspnea. No exertional symptoms. She also denies orthopnea, PND, LEE, palpitations, dizziness, syncope/ near syncope. Her main complaint is arthritis. She admits that she takes NSAIDs daily, in addition to Tylenol. She is also on ASA and Plavix for CAD. She takes a PPI daily. She reports dark, black stools. Unsure if this is melena or subsequent to supplemental Fe. VSS. BP is well controlled.   Current Meds  Medication Sig  . ACCU-CHEK COMPACT PLUS test strip TEST 3 TIMES DAILY AS DIRECTED.  Marland Kitchen acetaminophen (TYLENOL) 325 MG tablet Take 650 mg by mouth every 6 (six) hours as needed for mild pain. Reported on 10/23/2015  . albuterol (PROVENTIL HFA;VENTOLIN HFA) 108 (90 Base) MCG/ACT inhaler Inhale 1 puff into the lungs every 4 (four) hours as needed for wheezing or shortness of breath.  Marland Kitchen albuterol (PROVENTIL) (2.5 MG/3ML) 0.083% nebulizer solution TAKE 3 MLS (2.5 MG TOTAL) BY NEBULIZATION EVERY 6 (SIX) HOURS AS NEEDED FOR  WHEEZING OR SHORTNESS OF BREATH  . amLODipine (NORVASC) 10 MG tablet TAKE 1 TABLET (10 MG TOTAL) BY MOUTH DAILY.  Marland Kitchen aspirin 81 MG tablet Take 81 mg by mouth daily.   . B-D ULTRAFINE III SHORT PEN 31G X 8 MM MISC USE WITH LANTUS SOLOSTAR  . carvedilol (COREG) 12.5 MG tablet TAKE 1 TABLET BY MOUTH TWICE DAILY WITH A MEAL  . clopidogrel (PLAVIX) 75 MG tablet Take 1 tablet (75 mg total) by mouth daily.  . enalapril (VASOTEC) 20 MG tablet TAKE 1 TABLET (20 MG TOTAL) BY MOUTH 2 (TWO) TIMES DAILY.  . fenofibrate micronized (LOFIBRA) 134 MG capsule Take 1 capsule (134 mg total) by mouth daily.  . ferrous sulfate 325 (65 FE) MG tablet Take 1 tablet (325 mg total) by mouth 2 (two) times daily with a meal.  . fish oil-omega-3 fatty acids 1000 MG capsule Take 2 g by mouth daily.    . Fluticasone-Salmeterol (ADVAIR DISKUS) 250-50 MCG/DOSE AEPB INHALE 1 PUFF INTO THE LUNGS DAILY AS NEEDED.  . folic acid (FOLVITE) 194 MCG tablet Take 400 mcg by mouth daily.    . furosemide (LASIX) 40 MG tablet Take 1 tablet (40 mg total) by mouth daily.  . Insulin Glargine (LANTUS SOLOSTAR) 100 UNIT/ML Solostar Pen Inject 17-20 Units into the skin every morning.  . Melatonin 5 MG CAPS Take 1 capsule by mouth at bedtime.   . metFORMIN (GLUCOPHAGE) 1000 MG tablet Take 1 tablet (1,000 mg total) by mouth 2 (two) times daily.  Marland Kitchen  nitroGLYCERIN (NITROSTAT) 0.4 MG SL tablet PLACE 1 TABLET (0.4 MG TOTAL) UNDER THE TONGUE EVERY 5 (FIVE) MINUTES AS NEEDED FOR CHEST PAIN (CHEST PAIN OR SHORTNESS OF BREATH).  Marland Kitchen pantoprazole (PROTONIX) 40 MG tablet TAKE 1 TABLET (40 MG TOTAL) BY MOUTH DAILY.  Marland Kitchen potassium chloride SA (K-DUR,KLOR-CON) 20 MEQ tablet Take 1 tablet (20 mEq total) by mouth daily.  . rosuvastatin (CRESTOR) 40 MG tablet Take 1 tablet (40 mg total) by mouth daily.  . sertraline (ZOLOFT) 25 MG tablet TAKE 1 TABLET (25 MG TOTAL) BY MOUTH DAILY.  . sodium chloride (OCEAN) 0.65 % SOLN nasal spray Place 1 spray into both nostrils as  needed for congestion.  Marland Kitchen tiotropium (SPIRIVA HANDIHALER) 18 MCG inhalation capsule Place 1 capsule (18 mcg total) into inhaler and inhale daily at 2 PM.  . traMADol (ULTRAM) 50 MG tablet Take 1 tablet (50 mg total) by mouth 3 (three) times daily as needed.  . triamcinolone cream (KENALOG) 0.1 % Apply 1 application topically 2 (two) times daily.  . vitamin E 400 UNIT capsule Take 400 Units by mouth daily.     Allergies  Allergen Reactions  . Codeine Sulfate Nausea Only  . Morphine Sulfate Nausea Only   Past Medical History:  Diagnosis Date  . Anginal pain ([place])   . Arthritis   . AV block, 1st degree   . CAD (coronary artery disease) 1990; 2015   Cardiac cath 1990 with Dr. Lia Foyer and [name] Residual CAD, non obstructive per 2015 cath. STEMI status post stent in August 2015.  Marland Kitchen COPD (chronic obstructive pulmonary disease) ([place])   . Diabetes mellitus type 2, insulin dependent ([place])   . Essential hypertension   . Hyperlipidemia with target LDL less than 70   . Myocardial infarction ([place])   . Pericarditis-post MI (short course of steroids) 03/06/2014  . 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 Shortness of breath    Family History  Problem Relation Age of Onset  . Heart attack Mother 73  . Cancer Father 21  . Heart attack Son    Past Surgical History:  Procedure Laterality Date  . CATARACT EXTRACTION  2010  . CHOLECYSTECTOMY    . CORONARY ANGIOPLASTY WITH STENT PLACEMENT  02/18/14   Promus DES to RCA  . LEFT HEART CATHETERIZATION WITH CORONARY ANGIOGRAM N/A 02/18/2014   Procedure: LEFT HEART CATHETERIZATION WITH CORONARY ANGIOGRAM;  Surgeon: Jettie Booze, MD;  Location: [facility] CATH LAB;  Service: Cardiovascular;  Laterality: N/A;  . PTCA  1990   PTCA of  RCA  . TRANSTHORACIC ECHOCARDIOGRAM  02/02/2012   mild LVH, EF 55-60%, Normal WM, Gr 1 DD; Mild MR   Social History   Social History  . Marital status: Married    Spouse name: N/A  . Number of children: 2  . Years of education: N/A   Occupational History  . Retired Retired   Social History Main Topics  . Smoking status: Former Smoker    Packs/day: 1.00    Years: 55.00    Types: Cigarettes    Quit date: 09/29/2006  . Smokeless tobacco: Never Used  . Alcohol use No  . Drug use: No  . Sexual activity: Not on file   Other Topics Concern  .  Not on file   Social History Narrative   Lives with her son but he works all day. Home for dinner.  Independent ADL. Needs assist on some IADL.      Review of Systems: General: negative for chills, fever, night sweats or weight changes.  Cardiovascular: negative for chest pain, dyspnea on exertion, edema, orthopnea, palpitations, paroxysmal nocturnal dyspnea or shortness of breath Dermatological: negative for rash Respiratory: negative for cough or wheezing Urologic: negative for hematuria Abdominal: negative for nausea, vomiting, diarrhea, bright red blood per rectum, melena, or hematemesis Neurologic: negative for visual changes, syncope, or dizziness All other systems reviewed and are otherwise negative except as noted above.   Physical Exam:  Blood pressure 136/78, pulse 87, height 5\' 5"  (1.651 m), weight 183 lb (83 kg), SpO2 97 %.  General appearance: alert, cooperative and no distress Neck: no carotid bruit and no JVD Lungs: clear to auscultation bilaterally Heart: regular rate and rhythm, S1, S2 normal, no murmur, click, rub or gallop Extremities: extremities normal, atraumatic, no cyanosis or edema Pulses: 2+ and symmetric Skin: Skin color, texture, turgor normal. No rashes or lesions Neurologic: Grossly normal  EKG not performed  -- personally reviewed   ASSESSMENT AND PLAN:   1. CAD: h/o stenting of the RCA. Last  intervention was in 2015. Stable w/o recurrent CP. Continue medical therapy with ASA, Plavix, BB, statin and ACE-I.    2. HTN: controlled on current regimen. She is on an ACE-I. Will check f/u BMP today.   3. HLD: on statin therapy. Followed by PCP. Lipid panel 07/22/2016 showed LDL to be at goal at 45 mg/dL. Continue Crestor.   4. Chronic Diastolic HF: euvolemic on physical exam. BP is controlled and she is on a BB. Continue current regimen.   5. Black Stools: ? If from chronic oral Fe supplementation. However, she admits to daily NSAID use for OA, in the setting of DAPT. We will check a CBC to ensure no severe anemia. Avoidance of NSAIDs advised given DAPT. Pt advised to use Tylenol for pain.   Follow-Up w/ Dr. Angelena Form in 6 months.   Cordera Stineman Ladoris Gene, MHS [facility] 12/14/2016 3:42 PM

## 2016-12-15 LAB — CBC
HEMATOCRIT: 38.8 % (ref 34.0–46.6)
HEMOGLOBIN: 13 g/dL (ref 11.1–15.9)
MCH: 29.1 pg (ref 26.6–33.0)
MCHC: 33.5 g/dL (ref 31.5–35.7)
MCV: 87 fL (ref 79–97)
Platelets: 245 10*3/uL (ref 150–379)
RBC: 4.47 x10E6/uL (ref 3.77–5.28)
RDW: 13.7 % (ref 12.3–15.4)
WBC: 6.4 10*3/uL (ref 3.4–10.8)

## 2016-12-15 LAB — BASIC METABOLIC PANEL
BUN / CREAT RATIO: 17 (ref 12–28)
BUN: 16 mg/dL (ref 8–27)
CALCIUM: 9.6 mg/dL (ref 8.7–10.3)
CO2: 22 mmol/L (ref 18–29)
CREATININE: 0.94 mg/dL (ref 0.57–1.00)
Chloride: 100 mmol/L (ref 96–106)
GFR calc non Af Amer: 57 mL/min/{1.73_m2} — ABNORMAL LOW (ref >59)
GFR, EST AFRICAN AMERICAN: 66 mL/min/{1.73_m2} (ref >59)
GLUCOSE: 200 mg/dL — AB (ref 65–99)
Potassium: 3.9 mmol/L (ref 3.5–5.2)
Sodium: 139 mmol/L (ref 134–144)

## 2016-12-27 ENCOUNTER — Other Ambulatory Visit: Payer: Self-pay | Admitting: Family Medicine

## 2017-01-15 ENCOUNTER — Other Ambulatory Visit: Payer: Self-pay | Admitting: Family Medicine

## 2017-01-29 ENCOUNTER — Other Ambulatory Visit: Payer: Self-pay | Admitting: Family Medicine

## 2017-02-24 ENCOUNTER — Encounter: Payer: Self-pay | Admitting: Family Medicine

## 2017-02-24 ENCOUNTER — Ambulatory Visit (INDEPENDENT_AMBULATORY_CARE_PROVIDER_SITE_OTHER): Payer: Medicare HMO | Admitting: Family Medicine

## 2017-02-24 DIAGNOSIS — J449 Chronic obstructive pulmonary disease, unspecified: Secondary | ICD-10-CM

## 2017-02-24 DIAGNOSIS — I10 Essential (primary) hypertension: Secondary | ICD-10-CM

## 2017-02-24 DIAGNOSIS — I5032 Chronic diastolic (congestive) heart failure: Secondary | ICD-10-CM

## 2017-02-24 DIAGNOSIS — Z794 Long term (current) use of insulin: Secondary | ICD-10-CM | POA: Diagnosis not present

## 2017-02-24 DIAGNOSIS — E119 Type 2 diabetes mellitus without complications: Secondary | ICD-10-CM

## 2017-02-24 LAB — CBC
HCT: 37.9 % (ref 35.0–45.0)
HEMOGLOBIN: 12.7 g/dL (ref 11.7–15.5)
MCH: 29.3 pg (ref 27.0–33.0)
MCHC: 33.5 g/dL (ref 32.0–36.0)
MCV: 87.3 fL (ref 80.0–100.0)
MPV: 13.6 fL — ABNORMAL HIGH (ref 7.5–12.5)
PLATELETS: 232 10*3/uL (ref 140–400)
RBC: 4.34 MIL/uL (ref 3.80–5.10)
RDW: 13.2 % (ref 11.0–15.0)
WBC: 6.3 10*3/uL (ref 3.8–10.8)

## 2017-02-24 NOTE — Addendum Note (Signed)
Addended by: Tomi Likens on: 02/24/2017 04:26 PM   Modules accepted: Orders

## 2017-02-24 NOTE — Assessment & Plan Note (Signed)
S: compliant with advair and spiriva. proair inhaler really helps her as well- sparingly using nebulizer A/P: continue current rx

## 2017-02-24 NOTE — Assessment & Plan Note (Signed)
S: well controlled. On lantus 18 units, metformin 2g daily CBGs- fasting 104-150 Lab Results  Component Value Date   HGBA1C 6.2 10/21/2016   HGBA1C 7.0 (H) 07/22/2016   HGBA1C 6.5 03/07/2016   A/P: update a1c- hopeful remains below 7

## 2017-02-24 NOTE — Addendum Note (Signed)
Addended by: Tomi Likens on: 02/24/2017 04:27 PM   Modules accepted: Orders

## 2017-02-24 NOTE — Patient Instructions (Signed)
Please stop by lab before you go  We will call you within a week or two about your referral to eye doctor for updated diabetic eye exam. If you do not hear within 3 weeks, give [place] a call.

## 2017-02-24 NOTE — Assessment & Plan Note (Signed)
Weight stable. No edema. Compliant with lasix. No changes today- update potassium and creatinine

## 2017-02-24 NOTE — Assessment & Plan Note (Signed)
S: controlled on enalapril 20mg  BID, lasix 40mg , coreg 12.5mg , amlodipine 10mg  BP Readings from Last 3 Encounters:  02/24/17 (!) 118/50  12/14/16 136/78  10/21/16 138/62  A/P: We discussed blood pressure goal of <140/90. Continue current meds

## 2017-02-24 NOTE — Progress Notes (Signed)
Subjective:  Michelle [name] is a 83 y.o. year old very pleasant female patient who presents for/with See problem oriented charting ROS- no chest pain. Continued joint pain particularly knees. SOB at times- albuterol helps. No new edema.    Past Medical History-  Patient Active Problem List   Diagnosis Date Noted  . Chronic diastolic CHF (congestive heart failure) ([place]) 03/13/2014    Priority: High  . CAD- RCA PCI '90s, STEMI-RCA DES 02/18/14 03/05/2014    Priority: High  . IDDM type 2 03/26/2007    Priority: High  . Anemia, iron deficiency 10/23/2015    Priority: Medium  . Osteoarthritis, knee 05/13/2014    Priority: Medium  . Anxiety state 04/21/2014    Priority: Medium  . SOB (shortness of breath) 03/05/2014    Priority: Medium  . Hyperlipidemia with target LDL less than 70 03/26/2007    Priority: Medium  . Essential hypertension 04/21/2006    Priority: Medium  . COPD (chronic obstructive pulmonary disease) ([place]) 04/21/2006    Priority: Medium  . CKD (chronic kidney disease), stage II 07/25/2014    Priority: Low  . Back pain, lumbosacral 10/10/2012    Priority: Low  . Chest pain 12/14/2014    Medications- reviewed and updated Current Outpatient Prescriptions  Medication Sig Dispense Refill  . ACCU-CHEK COMPACT PLUS test strip TEST 3 TIMES DAILY AS DIRECTED. 102 each 5  . ACCU-CHEK FASTCLIX LANCETS MISC     . acetaminophen (TYLENOL) 325 MG tablet Take 650 mg by mouth every 6 (six) hours as needed for mild pain. Reported on 10/23/2015    . albuterol (PROVENTIL HFA;VENTOLIN HFA) 108 (90 Base) MCG/ACT inhaler Inhale 1 puff into the lungs every 4 (four) hours as needed for wheezing or shortness of breath. 1 Inhaler 0  . albuterol (PROVENTIL) (2.5 MG/3ML) 0.083% nebulizer solution TAKE 3 MLS (2.5 MG TOTAL) BY NEBULIZATION EVERY 6 (SIX) HOURS AS NEEDED FOR WHEEZING OR SHORTNESS OF BREATH 75 mL 5  . Alcohol Swabs (B-D SINGLE USE SWABS REGULAR) PADS     . amLODipine (NORVASC) 10 MG  tablet TAKE 1 TABLET (10 MG TOTAL) BY MOUTH DAILY. 90 tablet 3  . aspirin 81 MG tablet Take 81 mg by mouth daily.     . B-D ULTRAFINE III SHORT PEN 31G X 8 MM MISC USE WITH LANTUS SOLOSTAR 100 each 9  . Blood Glucose Calibration (ACCU-CHEK SMARTVIEW CONTROL) LIQD     . carvedilol (COREG) 12.5 MG tablet TAKE 1 TABLET BY MOUTH TWICE DAILY WITH A MEAL 180 tablet 4  . clopidogrel (PLAVIX) 75 MG tablet Take 1 tablet (75 mg total) by mouth daily. 30 tablet 10  . enalapril (VASOTEC) 20 MG tablet TAKE 1 TABLET (20 MG TOTAL) BY MOUTH 2 (TWO) TIMES DAILY. 90 tablet 3  . fenofibrate micronized (LOFIBRA) 134 MG capsule Take 1 capsule (134 mg total) by mouth daily. 90 capsule 3  . ferrous sulfate 325 (65 FE) MG tablet Take 1 tablet (325 mg total) by mouth 2 (two) times daily with a meal. 60 tablet 3  . fish oil-omega-3 fatty acids 1000 MG capsule Take 2 g by mouth daily.      . Fluticasone-Salmeterol (ADVAIR DISKUS) 250-50 MCG/DOSE AEPB INHALE 1 PUFF INTO THE LUNGS DAILY AS NEEDED. 60 each 4  . folic acid (FOLVITE) 774 MCG tablet Take 400 mcg by mouth daily.      . furosemide (LASIX) 40 MG tablet Take 1 tablet (40 mg total) by mouth daily. 90 tablet  2  . Insulin Glargine (LANTUS SOLOSTAR) 100 UNIT/ML Solostar Pen Inject 17-20 Units into the skin every morning. 15 pen 10  . Melatonin 5 MG CAPS Take 1 capsule by mouth at bedtime.     . metFORMIN (GLUCOPHAGE) 1000 MG tablet TAKE 1 TABLET TWICE DAILY 180 tablet 3  . nitroGLYCERIN (NITROSTAT) 0.4 MG SL tablet PLACE 1 TABLET (0.4 MG TOTAL) UNDER THE TONGUE EVERY 5 (FIVE) MINUTES AS NEEDED FOR CHEST PAIN (CHEST PAIN OR SHORTNESS OF BREATH). 25 tablet 4  . pantoprazole (PROTONIX) 40 MG tablet TAKE 1 TABLET (40 MG TOTAL) BY MOUTH DAILY. 90 tablet 3  . potassium chloride SA (K-DUR,KLOR-CON) 20 MEQ tablet Take 1 tablet (20 mEq total) by mouth daily. 30 tablet 11  . rosuvastatin (CRESTOR) 40 MG tablet Take 1 tablet (40 mg total) by mouth daily. 90 tablet 2  . sertraline  (ZOLOFT) 25 MG tablet TAKE 1 TABLET (25 MG TOTAL) BY MOUTH DAILY. 90 tablet 3  . sodium chloride (OCEAN) 0.65 % SOLN nasal spray Place 1 spray into both nostrils as needed for congestion.    Marland Kitchen SPIRIVA HANDIHALER 18 MCG inhalation capsule PLACE 1 CAPSULE INTO HANDIHALER AND INHALE AS DIRECTED DAILY AT 2:00PM 90 capsule 6  . traMADol (ULTRAM) 50 MG tablet TAKE ONE TABLET BY MOUTH THREE TIMES A DAY AS NEEDED 60 tablet 4  . triamcinolone cream (KENALOG) 0.1 % Apply 1 application topically 2 (two) times daily. 30 g 0  . vitamin E 400 UNIT capsule Take 400 Units by mouth daily.       No current facility-administered medications for this visit.     Objective: BP (!) 118/50 (BP Location: Left Arm, Patient Position: Sitting, Cuff Size: Large)   Pulse 75   Temp 98 F (36.7 C) (Oral)   Ht 5\' 5"  (1.651 m)   Wt 182 lb (82.6 kg)   SpO2 93%   BMI 30.29 kg/m  Gen: NAD, resting comfortably CV: RRR no murmurs rubs or gallops Lungs: CTAB no crackles, wheeze, rhonchi, prolonged expiratory phase Abdomen: soft/nontender/nondistended/normal bowel sounds.obese  Ext: no edema Skin: warm, dry Neuro: in wheelchair  Assessment/Plan:  IDDM type 2 S: well controlled. On lantus 18 units, metformin 2g daily CBGs- fasting 104-150 Lab Results  Component Value Date   HGBA1C 6.2 10/21/2016   HGBA1C 7.0 (H) 07/22/2016   HGBA1C 6.5 03/07/2016   A/P: update a1c- hopeful remains below 7  COPD (chronic obstructive pulmonary disease) (HCC) S: compliant with advair and spiriva. proair inhaler really helps her as well- sparingly using nebulizer A/P: continue current rx   Essential hypertension S: controlled on enalapril 20mg  BID, lasix 40mg , coreg 12.5mg , amlodipine 10mg  BP Readings from Last 3 Encounters:  02/24/17 (!) 118/50  12/14/16 136/78  10/21/16 138/62  A/P: We discussed blood pressure goal of <140/90. Continue current meds  Chronic diastolic CHF (congestive heart failure) Weight stable. No edema.  Compliant with lasix. No changes today- update potassium and creatinine  4 months  Orders Placed This Encounter  Procedures  . CBC    Standing Status:   Future    Standing Expiration Date:   02/24/2018  . Comprehensive metabolic panel    [facility]    Standing Status:   Future    Standing Expiration Date:   02/24/2018  . Hemoglobin A1c    [facility]    Standing Status:   Future    Standing Expiration Date:   02/24/2018  . Ambulatory referral to Ophthalmology    Referral Priority:  Routine    Referral Type:   Consultation    Referral Reason:   Specialty Services Required    Requested Specialty:   Ophthalmology    Number of Visits Requested:   1    Meds ordered this encounter  Medications  . Alcohol Swabs (B-D SINGLE USE SWABS REGULAR) PADS  . Blood Glucose Calibration (ACCU-CHEK SMARTVIEW CONTROL) LIQD  . ACCU-CHEK FASTCLIX LANCETS MISC    Return precautions advised.  [name], MD

## 2017-02-25 LAB — COMPREHENSIVE METABOLIC PANEL
ALK PHOS: 63 U/L (ref 33–130)
ALT: 14 U/L (ref 6–29)
AST: 17 U/L (ref 10–35)
Albumin: 4.2 g/dL (ref 3.6–5.1)
BUN: 25 mg/dL (ref 7–25)
CO2: 24 mmol/L (ref 20–32)
Calcium: 9.5 mg/dL (ref 8.6–10.4)
Chloride: 102 mmol/L (ref 98–110)
Creat: 0.78 mg/dL (ref 0.60–0.88)
GLUCOSE: 156 mg/dL — AB (ref 65–99)
POTASSIUM: 4.2 mmol/L (ref 3.5–5.3)
Sodium: 137 mmol/L (ref 135–146)
Total Bilirubin: 0.3 mg/dL (ref 0.2–1.2)
Total Protein: 6.7 g/dL (ref 6.1–8.1)

## 2017-02-25 LAB — HEMOGLOBIN A1C
HEMOGLOBIN A1C: 6.6 % — AB (ref <5.7)
MEAN PLASMA GLUCOSE: 143 mg/dL

## 2017-02-28 ENCOUNTER — Other Ambulatory Visit: Payer: Self-pay | Admitting: Family Medicine

## 2017-03-17 ENCOUNTER — Other Ambulatory Visit: Payer: Self-pay | Admitting: Family Medicine

## 2017-03-27 ENCOUNTER — Other Ambulatory Visit: Payer: Self-pay | Admitting: Family Medicine

## 2017-04-03 ENCOUNTER — Other Ambulatory Visit: Payer: Self-pay | Admitting: Family Medicine

## 2017-04-14 ENCOUNTER — Ambulatory Visit (INDEPENDENT_AMBULATORY_CARE_PROVIDER_SITE_OTHER): Payer: Medicare HMO

## 2017-04-14 VITALS — BP 146/60 | HR 67 | Temp 98.6°F | Ht 65.0 in | Wt 181.0 lb

## 2017-04-14 DIAGNOSIS — Z0000 Encounter for general adult medical examination without abnormal findings: Secondary | ICD-10-CM

## 2017-04-14 NOTE — Progress Notes (Signed)
Subjective:   Michelle [name] is a 83 y.o. female who presents for Medicare Annual (Subsequent) preventive examination.  The Patient was informed that the wellness visit is to identify future health risk and educate and initiate measures that can reduce risk for increased disease through the lifespan.    Annual Wellness Assessment today and in with son  Reports health as  Has 2 children  2 sons;  2 grands and 2 great grand children  Son lives with her but not the one with her today  One level for her and has not been downstairs since her MI several years ago  Has a walker at home with a seat     Preventive Screening -Counseling & Management  Medicare Annual Preventive Care Visit - Subsequent Last OV 02/24/2017  Mammogram was in 2004 Health Maintenance Due  Topic Date Due  . OPHTHALMOLOGY EXAM  11/16/2014  . INFLUENZA VACCINE  02/15/2017    Eye exam scheduled in 2 weeks and postponed until the end of October for result   Deferred flu vaccine today due to not feeling well but generally takes them at the pharmacy and will do so next week if she is feeling better    VS reviewed;  BP 118/70 and 136/78 Today elevated 146/ 60   Diet  Breakfast; eat eggs; sausage and liver pudding Lunch; eats sandwich Supper eat white castle biscuits  Encouraged to slow down on the sodium for a couple of days    BMI 30   Exercise Discussed chair exercises and the need for chair exercise  States she has been in the bed this week. 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 Yong Channel recommended    Dental - given a few dental services     Sleep patterns: sleep good   Pain; states she does not walk due to pain in knee secondary to OA. Educated to walk and this would help OA     Cardiac Risk Factors  Addressed CAD; post PTCA and stemi in 2015  Hyperlipidemia - LDL < 70 Diabetes - glucose 156 and a1c 6.6   Obesity Positive   Advanced Directives Advanced Directive; Reviewed advanced directive and agreed to receipt of information and discussion.  Focused face to face x  20 minutes discussing HCPOA and Living will and reviewed all the questions in the [place] forms. The patient voices understanding of HCPOA; LW reviewed and information provided on each question. Educated on how to revoke this HCPOA or LW at any time.   Also  discussed life prolonging measures (given a few examples) and where she could choose to initiate or not;  the ability to given the HCPOA power to change her living will or not if she cannot speak for herself; as well as finalizing the will by 2 unrelated witnesses and notary.  Will call for questions and given information on [facility] pastoral department for further assistance.      Patient Care Team: [name], MD as PCP - General (Family Medicine)    Cardiac Risk Factors include: advanced age (>24men, >14 women);diabetes mellitus;dyslipidemia;family history of premature cardiovascular disease;hypertension;obesity (BMI >30kg/m2);sedentary lifestyle     Objective:     Vitals: BP (!) 146/60   Pulse 67  Temp 98.6 F (37 C)   Ht 5\' 5"  (1.651 m)   Wt 181 lb (82.1 kg)   SpO2 94%   BMI 30.12 kg/m   Body mass index is 30.12 kg/m.   Tobacco History  Smoking Status  . Former Smoker  . Packs/day: 1.00  . Years: 55.00  . Types: Cigarettes  . Quit date: 09/29/2006  Smokeless Tobacco  . Never Used     Counseling given: Yes   Past Medical History:  Diagnosis Date  . Anginal pain ([place])   . Arthritis   . AV block, 1st degree   . CAD (coronary artery disease) 1990; 2015   Cardiac cath 1990 with Dr. Lia Foyer and pt reports blockage in artery  with angioplasty. She has pictures that show severe stenosis mid RCA and a post PTCA picture with 30%  residual stenosis post PTCA. Residual CAD, non obstructive per 2015 cath. STEMI status post stent in August 2015.  Marland Kitchen COPD (chronic obstructive pulmonary disease) ([place])   . Diabetes mellitus type 2, insulin dependent ([place])   . Essential hypertension   . Hyperlipidemia with target LDL less than 70   . Myocardial infarction ([place])   . Pericarditis-post MI (short course of steroids) 03/06/2014  . 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 Shortness of breath    Past Surgical History:  Procedure Laterality Date  . CATARACT EXTRACTION  2010  . CHOLECYSTECTOMY    . CORONARY ANGIOPLASTY WITH STENT PLACEMENT  02/18/14   Promus DES to RCA  . LEFT HEART CATHETERIZATION WITH CORONARY ANGIOGRAM N/A 02/18/2014   Procedure: LEFT HEART CATHETERIZATION WITH CORONARY ANGIOGRAM;  Surgeon: Jettie Booze, MD;  Location: [facility] CATH LAB;  Service: Cardiovascular;  Laterality: N/A;  . PTCA  1990   PTCA of RCA  . TRANSTHORACIC ECHOCARDIOGRAM  02/02/2012   mild LVH, EF 55-60%, Normal WM, Gr 1 DD; Mild MR   Family History  Problem Relation Age of Onset  . Heart attack Mother 72  . Cancer Father 71  . Heart attack Son    History  Sexual Activity  . Sexual activity: Not on file    Outpatient Encounter Prescriptions as of 04/14/2017  Medication Sig  . ACCU-CHEK COMPACT PLUS test strip TEST 3 TIMES DAILY AS DIRECTED.  Marland Kitchen ACCU-CHEK FASTCLIX LANCETS MISC   . acetaminophen (TYLENOL) 325 MG tablet Take 650 mg by mouth every 6 (six) hours as needed for mild pain. Reported on 10/23/2015  . ADVAIR DISKUS 250-50 MCG/DOSE AEPB INHALE 1 PUFF INTO THE LUNGS DAILY AS NEEDED.  Marland Kitchen albuterol (PROVENTIL HFA;VENTOLIN HFA) 108 (90 Base) MCG/ACT inhaler Inhale 1 puff into the lungs every 4 (four) hours as needed for wheezing or shortness of breath.  Marland Kitchen albuterol (PROVENTIL) (2.5 MG/3ML) 0.083% nebulizer solution TAKE 3 MLS (2.5 MG TOTAL) BY NEBULIZATION EVERY 6  (SIX) HOURS AS NEEDED FOR WHEEZING OR SHORTNESS OF BREATH  . Alcohol Swabs (B-D SINGLE USE SWABS REGULAR) PADS   . amLODipine (NORVASC) 10 MG tablet TAKE 1 TABLET (10 MG TOTAL) BY MOUTH DAILY.  Marland Kitchen aspirin 81 MG tablet Take 81 mg by mouth daily.   . B-D ULTRAFINE III SHORT PEN 31G X 8 MM MISC USE WITH LANTUS SOLOSTAR  . Blood Glucose Calibration (ACCU-CHEK SMARTVIEW CONTROL) LIQD   . carvedilol (COREG) 12.5 MG tablet TAKE 1 TABLET BY MOUTH TWICE DAILY WITH A MEAL  . clopidogrel (PLAVIX) 75  MG tablet Take 1 tablet (75 mg total) by mouth daily.  . enalapril (VASOTEC) 20 MG tablet TAKE 1 TABLET TWICE DAILY  . fenofibrate micronized (LOFIBRA) 134 MG capsule Take 1 capsule (134 mg total) by mouth daily.  . ferrous sulfate 325 (65 FE) MG tablet Take 1 tablet (325 mg total) by mouth 2 (two) times daily with a meal.  . fish oil-omega-3 fatty acids 1000 MG capsule Take 2 g by mouth daily.    . folic acid (FOLVITE) 696 MCG tablet Take 400 mcg by mouth daily.    . furosemide (LASIX) 40 MG tablet TAKE 1 TABLET EVERY DAY  . Insulin Glargine (LANTUS SOLOSTAR) 100 UNIT/ML Solostar Pen Inject 17-20 Units into the skin every morning.  . Melatonin 5 MG CAPS Take 1 capsule by mouth at bedtime.   . metFORMIN (GLUCOPHAGE) 1000 MG tablet TAKE 1 TABLET TWICE DAILY  . nitroGLYCERIN (NITROSTAT) 0.4 MG SL tablet PLACE 1 TABLET (0.4 MG TOTAL) UNDER THE TONGUE EVERY 5 (FIVE) MINUTES AS NEEDED FOR CHEST PAIN (CHEST PAIN OR SHORTNESS OF BREATH).  Marland Kitchen pantoprazole (PROTONIX) 40 MG tablet TAKE 1 TABLET (40 MG TOTAL) BY MOUTH DAILY.  Marland Kitchen potassium chloride SA (K-DUR,KLOR-CON) 20 MEQ tablet Take 1 tablet (20 mEq total) by mouth daily.  . rosuvastatin (CRESTOR) 40 MG tablet Take 1 tablet (40 mg total) by mouth daily.  . sertraline (ZOLOFT) 25 MG tablet TAKE 1 TABLET (25 MG TOTAL) BY MOUTH DAILY.  . sodium chloride (OCEAN) 0.65 % SOLN nasal spray Place 1 spray into both nostrils as needed for congestion.  Marland Kitchen SPIRIVA HANDIHALER 18 MCG  inhalation capsule PLACE 1 CAPSULE INTO HANDIHALER AND INHALE AS DIRECTED DAILY AT 2:00PM  . traMADol (ULTRAM) 50 MG tablet TAKE ONE TABLET BY MOUTH THREE TIMES A DAY AS NEEDED  . triamcinolone cream (KENALOG) 0.1 % Apply 1 application topically 2 (two) times daily.  . vitamin E 400 UNIT capsule Take 400 Units by mouth daily.     No facility-administered encounter medications on file as of 04/14/2017.     Activities of Daily Living In your present state of health, do you have any difficulty performing the following activities: 04/14/2017  Hearing? Y  Comment some, can have hearing test if she like; given resources   Vision? N  Difficulty concentrating or making decisions? N  Walking or climbing stairs? N  Dressing or bathing? N  Doing errands, shopping? N  Preparing Food and eating ? N  Using the Toilet? N  In the past six months, have you accidently leaked urine? N  Do you have problems with loss of bowel control? Y  Comment cleared up now with cutting metformin in 1/2 at hs  Managing your Medications? N  Managing your Finances? N  Housekeeping or managing your Housekeeping? N  Some recent data might be hidden    Patient Care Team: [name], MD as PCP - General (Family Medicine)    Assessment:     Exercise Activities and Dietary recommendations Current Exercise Habits: Home exercise routine, Time (Minutes): 10, Frequency (Times/Week): 2, Weekly Exercise (Minutes/Week): 20, Intensity: Mild  Goals    . Exercise 150 minutes per week (moderate activity)          Exercise in the chair Get up from the chair 3 times ; 3 times a day Walk in the home a couple times a day       Fall Risk Fall Risk  04/14/2017 07/12/2016 06/02/2016 05/09/2016 04/13/2016  Falls  in the past year? No Yes Yes Yes Yes  Number falls in past yr: - - - - -  Injury with Fall? - - - - -  Follow up - - Falls prevention discussed Falls prevention discussed -   Depression Screen PHQ 2/9 Scores  04/14/2017 07/12/2016 06/02/2016 05/09/2016  PHQ - 2 Score 0 0 0 0     Cognitive Function MMSE - Mini Mental State Exam 04/14/2017  Not completed: (No Data)   Ad8 score reviewed for issues:  Issues making decisions:  Less interest in hobbies / activities:  Repeats questions, stories (family complaining):  Trouble using ordinary gadgets (microwave, computer, phone):  Forgets the month or year:   Mismanaging finances:   Remembering appts:  Daily problems with thinking and/or memory: Ad8 score is=minor issues but still can cook; carry a conversation and no new issues or failures of judgement.        Immunization History  Administered Date(s) Administered  . Influenza Split 04/21/2011, 05/16/2012  . Influenza Whole 05/24/2007, 05/01/2009, 04/17/2010  . Influenza,inj,Quad PF,6+ Mos 04/14/2013, 04/10/2014, 04/03/2015  . Influenza-Unspecified 06/01/2016  . Pneumococcal Conjugate-13 09/22/2014  . Pneumococcal Polysaccharide-23 04/18/2003  . Tdap 07/29/2011  . Zoster 04/18/2011   Screening Tests Health Maintenance  Topic Date Due  . OPHTHALMOLOGY EXAM  11/16/2014  . INFLUENZA VACCINE  02/15/2017  . DEXA SCAN  04/21/2043 (Originally 12/31/1999)  . FOOT EXAM  07/22/2017  . HEMOGLOBIN A1C  08/27/2017  . TETANUS/TDAP  07/28/2021  . PNA vac Low Risk Adult  Completed      Plan:     PCP Notes  Health Maintenance LDCT discussed due to smoking hx; on hold Deferred flu due to not feeling well but will take next week  Given resources for hearing screen Given resources for dental work   Reviewed advanced directives. To complete and bring in to dicsuss with Dr. Yong Channel    Abnormal Screens  Obesity; educated to decrease sodium due to BP being mod high today. Ate sausage this am and biscuits   Referrals  None   Patient concerns; Has been sick this week; no energy; states she has been in bed all week.   BS 120 or 125 in the am States she is a little sob; has taken her  maintenance meds and albuterol x 2 today and neb x 1 today Not coughing anything up but states she has cough syrup and is talking this  Has not gained weight on home scale in the last week;  Pulse ox 94; rate steady; no edema to ankles; Temp 98.6 Breath sounds clear; with sporadic wheezed which cleared easily when sitting up .  Educated to sit on the side of the bed and move in the home. Take her breathing treatments as needed and to make an apt with Dr. Yong Channel next week if she is worse on not feeling better    Nurse Concerns; As noted   Next PCP apt 06/23/2017     I have personally reviewed and noted the following in the patient's chart:   . Medical and social history . Use of alcohol, tobacco or illicit drugs  . Current medications and supplements . Functional ability and status . Nutritional status . Physical activity . Advanced directives . List of other physicians . Hospitalizations, surgeries, and ER visits in previous 12 months . Vitals . Screenings to include cognitive, depression, and falls . Referrals and appointments  In addition, I have reviewed and discussed with patient certain preventive protocols,  quality metrics, and best practice recommendations. A written personalized care plan for preventive services as well as general preventive health recommendations were provided to patient.     Wynetta Fines, RN  04/14/2017

## 2017-04-14 NOTE — Patient Instructions (Addendum)
[name] , Thank you for taking time to come for your Medicare Wellness Visit. I appreciate your ongoing commitment to your health goals. Please review the following plan we discussed and let me know if I can assist you in the future.   Will try to get up and start walking short distances. Increase every day if able Try getting up and down from your chair 3 times at least 2 to 3 times a day If in bed, sit on the side of the bed at least every 1 to 2 hours   Discussed LDCT due to smoking hx; states she will defer for now   Medicare pays for a hearing screen Deaf & Hard of Hearing Division Services - can assist with hearing aid x 1  No reviews  [facility]  [facility] [address]  [phone]  [place]  [address]  [place], [place] [address]  Phone [phone]  [place]; [email or web address]/ Monthly drawing/ potential patients can call [phone] to have an application mailed to their residence within 10 days.     [place] [facility]; [phone] [facility] [place]; [phone] Get resource to get information on any and all community programs for Seniors  [place]: [phone] Community Health Response Program -545-625-6389 Public Health Dept; Need to be a skilled visit but can assist with bathing as well; 629-542-5342  Adult center for Enrichment;  Call Senior Line; 4198685180  Adult day services include Adult Day Care, Adult Day Healthcare, Group Respite, Care Partners, Volunteer [employer], Education and Support Program  Dept of Social Services; Call [phone] and ask for SW on call  Options for Medicaid include the Community Alternatives program; [place] (personal care services) or PACE program, which is a medical and social program combined  Braulio Conte manages the  community Alternatives program at the [employer]; 646-803-2122 (this is a program with a waiting list but provides SNF care at home;  [facility] 2 required; Call Claiborne Billings and she will send out packet of information   Caregiver support group and information regarding [place] is at the; [facility] Address: [address] [place], [place] [address]  Phone: [phone]  Shingrix is a vaccine for the prevention of Shingles in Adults 50 and older.  If you are on Medicare, you can request a prescription from your doctor to be filled at a pharmacy.  Please check with your benefits regarding applicable copays or out of pocket expenses.  The Shingrix is given in 2 vaccines approx 8 weeks apart. You must receive the 2nd dose prior to 6 months from receipt of the first.   Eye exam scheduled for 2 weeks Please fax a copy   Will take flu vaccine next week when feeling better       These are the goals we discussed: Goals    . Exercise 150 minutes per week (moderate activity)          Exercise in the chair Get up from the chair 3 times ; 3 times a day Walk in the home a couple times a day        This is a list of the screening recommended for you and due dates:  Health Maintenance  Topic Date Due  . Eye exam for diabetics  11/16/2014  . Flu Shot  02/15/2017  . DEXA scan (bone density measurement)  04/21/2043*  .  Complete foot exam   07/22/2017  . Hemoglobin A1C  08/27/2017  . Tetanus Vaccine  07/28/2021  . Pneumonia vaccines  Completed  *Topic was postponed. The date shown is not the original due date.    Health Maintenance for Postmenopausal Women Menopause is a normal process in which your reproductive ability comes to an end. This process happens gradually over a span of months to years, usually between the ages of 10 and 53. Menopause is complete when you have missed 12 consecutive menstrual periods. It is important to talk with your health  care provider about some of the most common conditions that affect postmenopausal women, such as heart disease, cancer, and bone loss (osteoporosis). Adopting a healthy lifestyle and getting preventive care can help to promote your health and wellness. Those actions can also lower your chances of developing some of these common conditions. What should I know about menopause? During menopause, you may experience a number of symptoms, such as:  Moderate-to-severe hot flashes.  Night sweats.  Decrease in sex drive.  Mood swings.  Headaches.  Tiredness.  Irritability.  Memory problems.  Insomnia.  Choosing to treat or not to treat menopausal changes is an individual decision that you make with your health care provider. What should I know about hormone replacement therapy and supplements? Hormone therapy products are effective for treating symptoms that are associated with menopause, such as hot flashes and night sweats. Hormone replacement carries certain risks, especially as you become older. If you are thinking about using estrogen or estrogen with progestin treatments, discuss the benefits and risks with your health care provider. What should I know about heart disease and stroke? Heart disease, heart attack, and stroke become more likely as you age. This may be due, in part, to the hormonal changes that your body experiences during menopause. These can affect how your body processes dietary fats, triglycerides, and cholesterol. Heart attack and stroke are both medical emergencies. There are many things that you can do to help prevent heart disease and stroke:  Have your blood pressure checked at least every 1-2 years. High blood pressure causes heart disease and increases the risk of stroke.  If you are 72-38 years old, ask your health care provider if you should take aspirin to prevent a heart attack or a stroke.  Do not use any tobacco products, including cigarettes, chewing  tobacco, or electronic cigarettes. If you need help quitting, ask your health care provider.  It is important to eat a healthy diet and maintain a healthy weight. ? Be sure to include plenty of vegetables, fruits, low-fat dairy products, and lean protein. ? Avoid eating foods that are high in solid fats, added sugars, or salt (sodium).  Get regular exercise. This is one of the most important things that you can do for your health. ? Try to exercise for at least 150 minutes each week. The type of exercise that you do should increase your heart rate and make you sweat. This is known as moderate-intensity exercise. ? Try to do strengthening exercises at least twice each week. Do these in addition to the moderate-intensity exercise.  Know your numbers.Ask your health care provider to check your cholesterol and your blood glucose. Continue to have your blood tested as directed by your health care provider.  What should I know about cancer screening? There are several types of cancer. Take the following steps to reduce your risk and to catch any cancer development as early as possible. Breast Cancer  Practice breast self-awareness. ? This means understanding how your breasts normally appear and feel. ? It also means doing regular breast self-exams. Let your health care provider know about any changes, no matter how small.  If you are 60 or older, have a clinician do a breast exam (clinical breast exam or CBE) every year. Depending on your age, family history, and medical history, it may be recommended that you also have a yearly breast X-ray (mammogram).  If you have a family history of breast cancer, talk with your health care provider about genetic screening.  If you are at high risk for breast cancer, talk with your health care provider about having an MRI and a mammogram every year.  Breast cancer (BRCA) gene test is recommended for women who have family members with BRCA-related cancers.  Results of the assessment will determine the need for genetic counseling and BRCA1 and for BRCA2 testing. BRCA-related cancers include these types: ? Breast. This occurs in males or females. ? Ovarian. ? Tubal. This may also be called fallopian tube cancer. ? Cancer of the abdominal or pelvic lining (peritoneal cancer). ? Prostate. ? Pancreatic.  Cervical, Uterine, and Ovarian Cancer Your health care provider may recommend that you be screened regularly for cancer of the pelvic organs. These include your ovaries, uterus, and vagina. This screening involves a pelvic exam, which includes checking for microscopic changes to the surface of your cervix (Pap test).  For women ages 21-65, health care providers may recommend a pelvic exam and a Pap test every three years. For women ages 50-65, they may recommend the Pap test and pelvic exam, combined with testing for human papilloma virus (HPV), every five years. Some types of HPV increase your risk of cervical cancer. Testing for HPV may also be done on women of any age who have unclear Pap test results.  Other health care providers may not recommend any screening for nonpregnant women who are considered low risk for pelvic cancer and have no symptoms. Ask your health care provider if a screening pelvic exam is right for you.  If you have had past treatment for cervical cancer or a condition that could lead to cancer, you need Pap tests and screening for cancer for at least 20 years after your treatment. If Pap tests have been discontinued for you, your risk factors (such as having a new sexual partner) need to be reassessed to determine if you should start having screenings again. Some women have medical problems that increase the chance of getting cervical cancer. In these cases, your health care provider may recommend that you have screening and Pap tests more often.  If you have a family history of uterine cancer or ovarian cancer, talk with your  health care provider about genetic screening.  If you have vaginal bleeding after reaching menopause, tell your health care provider.  There are currently no reliable tests available to screen for ovarian cancer.  Lung Cancer Lung cancer screening is recommended for adults 51-60 years old who are at high risk for lung cancer because of a history of smoking. A yearly low-dose CT scan of the lungs is recommended if you:  Currently smoke.  Have a history of at least 30 pack-years of smoking and you currently smoke or have quit within the past 15 years. A pack-year is smoking an average of one pack of cigarettes per day for one year.  Yearly screening should:  Continue until it has been 15 years since you quit.  Stop if you develop a health problem that would prevent you from having lung cancer treatment.  Colorectal Cancer  This type of cancer can be detected and can often be prevented.  Routine colorectal cancer screening usually begins at age 73 and continues through age 74.  If you have risk factors for colon cancer, your health care provider may recommend that you be screened at an earlier age.  If you have a family history of colorectal cancer, talk with your health care provider about genetic screening.  Your health care provider may also recommend using home test kits to check for hidden blood in your stool.  A small camera at the end of a tube can be used to examine your colon directly (sigmoidoscopy or colonoscopy). This is done to check for the earliest forms of colorectal cancer.  Direct examination of the colon should be repeated every 5-10 years until age 76. However, if early forms of precancerous polyps or small growths are found or if you have a family history or genetic risk for colorectal cancer, you may need to be screened more often.  Skin Cancer  Check your skin from head to toe regularly.  Monitor any moles. Be sure to tell your health care provider: ? About  any new moles or changes in moles, especially if there is a change in a mole's shape or color. ? If you have a mole that is larger than the size of a pencil eraser.  If any of your family members has a history of skin cancer, especially at a young age, talk with your health care provider about genetic screening.  Always use sunscreen. Apply sunscreen liberally and repeatedly throughout the day.  Whenever you are outside, protect yourself by wearing long sleeves, pants, a wide-brimmed hat, and sunglasses.  What should I know about osteoporosis? Osteoporosis is a condition in which bone destruction happens more quickly than new bone creation. After menopause, you may be at an increased risk for osteoporosis. To help prevent osteoporosis or the bone fractures that can happen because of osteoporosis, the following is recommended:  If you are 30-27 years old, get at least 1,000 mg of calcium and at least 600 mg of vitamin D per day.  If you are older than age 17 but younger than age 23, get at least 1,200 mg of calcium and at least 600 mg of vitamin D per day.  If you are older than age 49, get at least 1,200 mg of calcium and at least 800 mg of vitamin D per day.  Smoking and excessive alcohol intake increase the risk of osteoporosis. Eat foods that are rich in calcium and vitamin D, and do weight-bearing exercises several times each week as directed by your health care provider. What should I know about how menopause affects my mental health? Depression may occur at any age, but it is more common as you become older. Common symptoms of depression include:  Low or sad mood.  Changes in sleep patterns.  Changes in appetite or eating patterns.  Feeling an overall lack of motivation or enjoyment of activities that you previously enjoyed.  Frequent crying spells.  Talk with your health care provider if you think that you are experiencing depression. What should I know about  immunizations? It is important that you get and maintain your immunizations. These include:  Tetanus, diphtheria, and pertussis (Tdap) booster vaccine.  Influenza every year before the flu season begins.  Pneumonia vaccine.  Shingles vaccine.  Your  health care provider may also recommend other immunizations. This information is not intended to replace advice given to you by your health care provider. Make sure you discuss any questions you have with your health care provider. Document Released: 08/26/2005 Document Revised: 01/22/2016 Document Reviewed: 04/07/2015 Elsevier Interactive Patient Education  2018 [place] in the Home Falls can cause injuries and can affect people from all age groups. There are many simple things that you can do to make your home safe and to help prevent falls. What can I do on the outside of my home?  Regularly repair the edges of walkways and driveways and fix any cracks.  Remove high doorway thresholds.  Trim any shrubbery on the main path into your home.  Use bright outdoor lighting.  Clear walkways of debris and clutter, including tools and rocks.  Regularly check that handrails are securely fastened and in good repair. Both sides of any steps should have handrails.  Install guardrails along the edges of any raised decks or porches.  Have leaves, snow, and ice cleared regularly.  Use sand or salt on walkways during winter months.  In the garage, clean up any spills right away, including grease or oil spills. What can I do in the bathroom?  Use night lights.  Install grab bars by the toilet and in the tub and shower. Do not use towel bars as grab bars.  Use non-skid mats or decals on the floor of the tub or shower.  If you need to sit down while you are in the shower, use a plastic, non-slip stool.  Keep the floor dry. Immediately clean up any water that spills on the floor.  Remove soap buildup in the tub or  shower on a regular basis.  Attach bath mats securely with double-sided non-slip rug tape.  Remove throw rugs and other tripping hazards from the floor. What can I do in the bedroom?  Use night lights.  Make sure that a bedside light is easy to reach.  Do not use oversized bedding that drapes onto the floor.  Have a firm chair that has side arms to use for getting dressed.  Remove throw rugs and other tripping hazards from the floor. What can I do in the kitchen?  Clean up any spills right away.  Avoid walking on wet floors.  Place frequently used items in easy-to-reach places.  If you need to reach for something above you, use a sturdy step stool that has a grab bar.  Keep electrical cables out of the way.  Do not use floor polish or wax that makes floors slippery. If you have to use wax, make sure that it is non-skid floor wax.  Remove throw rugs and other tripping hazards from the floor. What can I do in the stairways?  Do not leave any items on the stairs.  Make sure that there are handrails on both sides of the stairs. Fix handrails that are broken or loose. Make sure that handrails are as long as the stairways.  Check any carpeting to make sure that it is firmly attached to the stairs. Fix any carpet that is loose or worn.  Avoid having throw rugs at the top or bottom of stairways, or secure the rugs with carpet tape to prevent them from moving.  Make sure that you have a light switch at the top of the stairs and the bottom of the stairs. If you do not have them, have them installed. What are  some other fall prevention tips?  Wear closed-toe shoes that fit well and support your feet. Wear shoes that have rubber soles or low heels.  When you use a stepladder, make sure that it is completely opened and that the sides are firmly locked. Have someone hold the ladder while you are using it. Do not climb a closed stepladder.  Add color or contrast paint or tape to grab  bars and handrails in your home. Place contrasting color strips on the first and last steps.  Use mobility aids as needed, such as canes, walkers, scooters, and crutches.  Turn on lights if it is dark. Replace any light bulbs that burn out.  Set up furniture so that there are clear paths. Keep the furniture in the same spot.  Fix any uneven floor surfaces.  Choose a carpet design that does not hide the edge of steps of a stairway.  Be aware of any and all pets.  Review your medicines with your healthcare provider. Some medicines can cause dizziness or changes in blood pressure, which increase your risk of falling. Talk with your health care provider about other ways that you can decrease your risk of falls. This may include working with a physical therapist or trainer to improve your strength, balance, and endurance. This information is not intended to replace advice given to you by your health care provider. Make sure you discuss any questions you have with your health care provider. Document Released: 06/24/2002 Document Revised: 12/01/2015 Document Reviewed: 08/08/2014 Elsevier Interactive Patient Education  2017 [place] DASH stands for "Dietary Approaches to Stop Hypertension." The DASH eating plan is a healthy eating plan that has been shown to reduce high blood pressure (hypertension). It may also reduce your risk for type 2 diabetes, heart disease, and stroke. The DASH eating plan may also help with weight loss. What are tips for following this plan? General guidelines  Avoid eating more than 2,300 mg (milligrams) of salt (sodium) a day. If you have hypertension, you may need to reduce your sodium intake to 1,500 mg a day.  Limit alcohol intake to no more than 1 drink a day for nonpregnant women and 2 drinks a day for men. One drink equals 12 oz of beer, 5 oz of wine, or 1 oz of hard liquor.  Work with your health care provider to maintain a healthy body  weight or to lose weight. Ask what an ideal weight is for you.  Get at least 30 minutes of exercise that causes your heart to beat faster (aerobic exercise) most days of the week. Activities may include walking, swimming, or biking.  Work with your health care provider or diet and nutrition specialist (dietitian) to adjust your eating plan to your individual calorie needs. Reading food labels  Check food labels for the amount of sodium per serving. Choose foods with less than 5 percent of the Daily Value of sodium. Generally, foods with less than 300 mg of sodium per serving fit into this eating plan.  To find whole grains, look for the word "whole" as the first word in the ingredient list. Shopping  Buy products labeled as "low-sodium" or "no salt added."  Buy fresh foods. Avoid canned foods and premade or frozen meals. Cooking  Avoid adding salt when cooking. Use salt-free seasonings or herbs instead of table salt or sea salt. Check with your health care provider or pharmacist before using salt substitutes.  Do not fry foods. Cook foods using  healthy methods such as baking, boiling, grilling, and broiling instead.  Cook with heart-healthy oils, such as olive, canola, soybean, or sunflower oil. Meal planning   Eat a balanced diet that includes: ? 5 or more servings of fruits and vegetables each day. At each meal, try to fill half of your plate with fruits and vegetables. ? Up to 6-8 servings of whole grains each day. ? Less than 6 oz of lean meat, poultry, or fish each day. A 3-oz serving of meat is about the same size as a deck of cards. One egg equals 1 oz. ? 2 servings of low-fat dairy each day. ? A serving of nuts, seeds, or beans 5 times each week. ? Heart-healthy fats. Healthy fats called Omega-3 fatty acids are found in foods such as flaxseeds and coldwater fish, like sardines, salmon, and mackerel.  Limit how much you eat of the following: ? Canned or prepackaged  foods. ? Food that is high in trans fat, such as fried foods. ? Food that is high in saturated fat, such as fatty meat. ? Sweets, desserts, sugary drinks, and other foods with added sugar. ? Full-fat dairy products.  Do not salt foods before eating.  Try to eat at least 2 vegetarian meals each week.  Eat more home-cooked food and less restaurant, buffet, and fast food.  When eating at a restaurant, ask that your food be prepared with less salt or no salt, if possible. What foods are recommended? The items listed may not be a complete list. Talk with your dietitian about what dietary choices are best for you. Grains Whole-grain or whole-wheat bread. Whole-grain or whole-wheat pasta. Brown rice. [place]. Bulgur. Whole-grain and low-sodium cereals. Pita bread. Low-fat, low-sodium crackers. Whole-wheat flour tortillas. Vegetables Fresh or frozen vegetables (raw, steamed, roasted, or grilled). Low-sodium or reduced-sodium tomato and vegetable juice. Low-sodium or reduced-sodium tomato sauce and tomato paste. Low-sodium or reduced-sodium canned vegetables. Fruits All fresh, dried, or frozen fruit. Canned fruit in natural juice (without added sugar). Meat and other protein foods Skinless chicken or [place]. Ground chicken or [place]. Pork with fat trimmed off. Fish and seafood. Egg whites. Dried beans, peas, or lentils. Unsalted nuts, nut butters, and seeds. Unsalted canned beans. Lean cuts of beef with fat trimmed off. Low-sodium, lean deli meat. Dairy Low-fat (1%) or fat-free (skim) milk. Fat-free, low-fat, or reduced-fat cheeses. Nonfat, low-sodium ricotta or cottage cheese. Low-fat or nonfat yogurt. Low-fat, low-sodium cheese. Fats and oils Soft margarine without trans fats. Vegetable oil. Low-fat, reduced-fat, or light mayonnaise and salad dressings (reduced-sodium). Canola, safflower, olive, soybean, and sunflower oils. Avocado. Seasoning and other foods Herbs. Spices. Seasoning  mixes without salt. Unsalted popcorn and pretzels. Fat-free sweets. What foods are not recommended? The items listed may not be a complete list. Talk with your dietitian about what dietary choices are best for you. Grains Baked goods made with fat, such as croissants, muffins, or some breads. Dry pasta or rice meal packs. Vegetables Creamed or fried vegetables. Vegetables in a cheese sauce. Regular canned vegetables (not low-sodium or reduced-sodium). Regular canned tomato sauce and paste (not low-sodium or reduced-sodium). Regular tomato and vegetable juice (not low-sodium or reduced-sodium). Angie Fava. Olives. Fruits Canned fruit in a light or heavy syrup. Fried fruit. Fruit in cream or butter sauce. Meat and other protein foods Fatty cuts of meat. Ribs. Fried meat. Berniece Salines. Sausage. Bologna and other processed lunch meats. Salami. Fatback. Hotdogs. Bratwurst. Salted nuts and seeds. Canned beans with added salt. Canned or smoked fish.  Whole eggs or egg yolks. Chicken or [place] with skin. Dairy Whole or 2% milk, cream, and half-and-half. Whole or full-fat cream cheese. Whole-fat or sweetened yogurt. Full-fat cheese. Nondairy creamers. Whipped toppings. Processed cheese and cheese spreads. Fats and oils Butter. Stick margarine. Lard. Shortening. Ghee. Bacon fat. Tropical oils, such as coconut, palm kernel, or palm oil. Seasoning and other foods Salted popcorn and pretzels. Onion salt, garlic salt, seasoned salt, table salt, and sea salt. Worcestershire sauce. Tartar sauce. Barbecue sauce. Teriyaki sauce. Soy sauce, including reduced-sodium. Steak sauce. Canned and packaged gravies. Fish sauce. Oyster sauce. Cocktail sauce. Horseradish that you find on the shelf. Ketchup. Mustard. Meat flavorings and tenderizers. Bouillon cubes. Hot sauce and Tabasco sauce. Premade or packaged marinades. Premade or packaged taco seasonings. Relishes. Regular salad dressings. Where to find more information:  National  Heart, Lung, and [place]: https://wilson-eaton.com/  American Heart Association: www.heart.org Summary  The DASH eating plan is a healthy eating plan that has been shown to reduce high blood pressure (hypertension). It may also reduce your risk for type 2 diabetes, heart disease, and stroke.  With the DASH eating plan, you should limit salt (sodium) intake to 2,300 mg a day. If you have hypertension, you may need to reduce your sodium intake to 1,500 mg a day.  When on the DASH eating plan, aim to eat more fresh fruits and vegetables, whole grains, lean proteins, low-fat dairy, and heart-healthy fats.  Work with your health care provider or diet and nutrition specialist (dietitian) to adjust your eating plan to your individual calorie needs. This information is not intended to replace advice given to you by your health care provider. Make sure you discuss any questions you have with your health care provider. Document Released: 06/23/2011 Document Revised: 06/27/2016 Document Reviewed: 06/27/2016 Elsevier Interactive Patient Education  2017 [employer].

## 2017-04-15 NOTE — Progress Notes (Signed)
I have reviewed and agree with note, evaluation, plan. Agree with sick care- if not improving or worsens should be seen immediately.   [name], MD

## 2017-04-28 DIAGNOSIS — H26493 Other secondary cataract, bilateral: Secondary | ICD-10-CM | POA: Diagnosis not present

## 2017-04-28 DIAGNOSIS — E119 Type 2 diabetes mellitus without complications: Secondary | ICD-10-CM | POA: Diagnosis not present

## 2017-04-28 DIAGNOSIS — H5203 Hypermetropia, bilateral: Secondary | ICD-10-CM | POA: Diagnosis not present

## 2017-04-28 LAB — HM DIABETES EYE EXAM

## 2017-05-04 ENCOUNTER — Encounter: Payer: Self-pay | Admitting: Family Medicine

## 2017-05-05 ENCOUNTER — Encounter: Payer: Self-pay | Admitting: Family Medicine

## 2017-06-09 ENCOUNTER — Other Ambulatory Visit: Payer: Self-pay | Admitting: Family Medicine

## 2017-06-15 ENCOUNTER — Ambulatory Visit: Payer: Medicare HMO | Admitting: Cardiovascular Disease

## 2017-06-15 ENCOUNTER — Encounter: Payer: Self-pay | Admitting: Cardiovascular Disease

## 2017-06-15 VITALS — BP 126/60 | HR 69 | Ht 65.0 in | Wt 183.4 lb

## 2017-06-15 DIAGNOSIS — I10 Essential (primary) hypertension: Secondary | ICD-10-CM | POA: Diagnosis not present

## 2017-06-15 DIAGNOSIS — E7800 Pure hypercholesterolemia, unspecified: Secondary | ICD-10-CM

## 2017-06-15 DIAGNOSIS — I2510 Atherosclerotic heart disease of native coronary artery without angina pectoris: Secondary | ICD-10-CM

## 2017-06-15 DIAGNOSIS — I5032 Chronic diastolic (congestive) heart failure: Secondary | ICD-10-CM | POA: Diagnosis not present

## 2017-06-15 NOTE — Patient Instructions (Signed)

## 2017-06-15 NOTE — Progress Notes (Signed)
Chief Complaint  Patient presents with  . Coronary Artery Disease     History of Present Illness: 83 y.o. female with a history of diastolic CHF, CAD s/p PTCA of a large dominant RCA in 1990 and inferior STEMI August 2015, DM, HTN, HLD, COPD, former tobacco abuse here today for cardiac follow up. She was admitted with an Inferior STEMI in August 2015 and emergent cardiac cath demonstrated an occluded RCA with aneurysm formation at the prior angioplasty site. The aneurysmal segment was treated with a covered stent. 2 drug-eluting stents (one proximal and one distal to the covered stent) were placed. There was a small pericardial effusion noted post MI. She was discharged on a short course of steroids and FU echo demonstrated just trivial effusion. Echo August 2015 showed normal LVEF and no significant pericardial effusion.She was readmitted one week later with acute CHF and diuresis was complicated by AKI and ACEI was held. She became dyspneic on Brilinta and she was changed to Effient.   She is here today for follow up. The patient denies any chest pain, dyspnea, palpitations, lower extremity edema, orthopnea, PND, dizziness, near syncope or syncope.   Primary Care Physician: [name], MD   Past Medical History:  Diagnosis Date  . Anginal pain ([place])   . Arthritis   . AV block, 1st degree   . CAD (coronary artery disease) 1990; 2015   Cardiac cath 1990 with Dr. Lia Foyer and [name] Residual CAD, non obstructive per 2015 cath. STEMI status post stent in August 2015.  Marland Kitchen COPD (chronic obstructive pulmonary disease) ([place])   . Diabetes mellitus type 2, insulin dependent ([place])   . Essential hypertension   . Hyperlipidemia with target LDL less than 70   . Myocardial infarction ([place])   . Pericarditis-post MI (short course of  steroids) 03/06/2014  . 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 Shortness of breath     Past Surgical History:  Procedure Laterality Date  . CATARACT EXTRACTION  2010  . CHOLECYSTECTOMY    . CORONARY ANGIOPLASTY WITH STENT PLACEMENT  02/18/14   Promus DES to RCA  . LEFT HEART CATHETERIZATION WITH CORONARY ANGIOGRAM N/A 02/18/2014   Procedure: LEFT HEART CATHETERIZATION WITH CORONARY ANGIOGRAM;  Surgeon: Jettie Booze, MD;  Location: [facility] CATH LAB;  Service: Cardiovascular;  Laterality: N/A;  . PTCA  1990   PTCA of RCA  . TRANSTHORACIC ECHOCARDIOGRAM  02/02/2012   mild LVH, EF 55-60%, Normal WM, Gr 1 DD; Mild MR    Current Outpatient Medications  Medication Sig Dispense Refill  . ACCU-CHEK COMPACT PLUS test strip TEST 3 TIMES DAILY AS DIRECTED. 102 each 5  . ACCU-CHEK FASTCLIX LANCETS MISC     . acetaminophen (TYLENOL) 325 MG tablet Take 650 mg by mouth every 6 (six) hours as needed for mild pain. Reported on 10/23/2015    . ADVAIR DISKUS 250-50 MCG/DOSE AEPB INHALE 1 PUFF INTO THE LUNGS DAILY AS NEEDED. 60 each 3  . albuterol (PROVENTIL HFA;VENTOLIN HFA) 108 (90 Base) MCG/ACT inhaler Inhale 1 puff into the lungs every 4 (four) hours as needed for wheezing or shortness of breath. 1 Inhaler 0  . albuterol (PROVENTIL) (2.5 MG/3ML) 0.083% nebulizer solution TAKE 3 MLS (2.5 MG TOTAL) BY  NEBULIZATION EVERY 6 (SIX) HOURS AS NEEDED FOR WHEEZING OR SHORTNESS OF BREATH 75 mL 5  . Alcohol Swabs (B-D SINGLE USE SWABS REGULAR) PADS     . amLODipine (NORVASC) 10 MG tablet TAKE 1 TABLET (10 MG TOTAL) BY MOUTH DAILY. 90 tablet 3  . aspirin 81 MG tablet Take 81 mg by mouth daily.     . B-D ULTRAFINE III SHORT PEN 31G X 8 MM MISC USE WITH LANTUS SOLOSTAR 100 each 9  . Blood Glucose Calibration (ACCU-CHEK SMARTVIEW CONTROL) LIQD     . carvedilol (COREG) 12.5 MG tablet TAKE 1 TABLET BY MOUTH TWICE DAILY WITH A MEAL 180 tablet 4  .  clopidogrel (PLAVIX) 75 MG tablet Take 1 tablet (75 mg total) by mouth daily. 30 tablet 10  . enalapril (VASOTEC) 20 MG tablet TAKE 1 TABLET TWICE DAILY 180 tablet 3  . fenofibrate micronized (LOFIBRA) 134 MG capsule Take 1 capsule (134 mg total) by mouth daily. 90 capsule 3  . ferrous sulfate 325 (65 FE) MG tablet Take 1 tablet (325 mg total) by mouth 2 (two) times daily with a meal. 60 tablet 3  . fish oil-omega-3 fatty acids 1000 MG capsule Take 2 g by mouth daily.      . folic acid (FOLVITE) 628 MCG tablet Take 400 mcg by mouth daily.      . furosemide (LASIX) 40 MG tablet TAKE 1 TABLET EVERY DAY 90 tablet 1  . Insulin Glargine (LANTUS SOLOSTAR) 100 UNIT/ML Solostar Pen Inject 17-20 Units into the skin every morning. 15 pen 10  . Melatonin 5 MG CAPS Take 1 capsule by mouth at bedtime.     . metFORMIN (GLUCOPHAGE) 1000 MG tablet TAKE 1 TABLET TWICE DAILY 180 tablet 3  . nitroGLYCERIN (NITROSTAT) 0.4 MG SL tablet PLACE 1 TABLET (0.4 MG TOTAL) UNDER THE TONGUE EVERY 5 (FIVE) MINUTES AS NEEDED FOR CHEST PAIN (CHEST PAIN OR SHORTNESS OF BREATH). 25 tablet 4  . pantoprazole (PROTONIX) 40 MG tablet TAKE 1 TABLET (40 MG TOTAL) BY MOUTH DAILY. 90 tablet 3  . potassium chloride SA (K-DUR,KLOR-CON) 20 MEQ tablet Take 1 tablet (20 mEq total) by mouth daily. 30 tablet 11  . rosuvastatin (CRESTOR) 40 MG tablet Take 1 tablet (40 mg total) by mouth daily. 90 tablet 2  . sertraline (ZOLOFT) 25 MG tablet TAKE 1 TABLET EVERY DAY 90 tablet 3  . sodium chloride (OCEAN) 0.65 % SOLN nasal spray Place 1 spray into both nostrils as needed for congestion.    Marland Kitchen SPIRIVA HANDIHALER 18 MCG inhalation capsule PLACE 1 CAPSULE INTO HANDIHALER AND INHALE AS DIRECTED DAILY AT 2:00PM 90 capsule 6  . traMADol (ULTRAM) 50 MG tablet TAKE ONE TABLET BY MOUTH THREE TIMES A DAY AS NEEDED 60 tablet 4  . triamcinolone cream (KENALOG) 0.1 % Apply 1 application topically 2 (two) times daily. 30 g 0  . vitamin E 400 UNIT capsule Take 400  Units by mouth daily.       No current facility-administered medications for this visit.     Allergies  Allergen Reactions  . Codeine Sulfate Nausea Only  . Morphine Sulfate Nausea Only    Social History   Socioeconomic History  . Marital status: Married    Spouse name: Not on file  . Number of children: 2  . Years of education: Not on file  . Highest education level: Not on file  Social Needs  . Financial resource strain: Not on file  . Food insecurity -  worry: Not on file  . Food insecurity - inability: Not on file  . Transportation needs - medical: Not on file  . Transportation needs - non-medical: Not on file  Occupational History  . Occupation: Retired    Fish farm manager: RETIRED  Tobacco Use  . Smoking status: Former Smoker    Packs/day: 1.00    Years: 55.00    Pack years: 55.00    Types: Cigarettes    Last attempt to quit: 09/29/2006    Years since quitting: 10.7  . Smokeless tobacco: Never Used  Substance and Sexual Activity  . Alcohol use: No  . Drug use: No  . Sexual activity: Not on file  Other Topics Concern  . Not on file  Social History Narrative   Lives with her son but he works all day. Home for dinner.  Independent ADL. Needs assist on some IADL.     Family History  Problem Relation Age of Onset  . Heart attack Mother 53  . Cancer Father 43  . Heart attack Son     Review of Systems:  As stated in the HPI and otherwise negative.   BP 126/60   Pulse 69   Ht 5\' 5"  (1.651 m)   Wt 183 lb 6.4 oz (83.2 kg)   SpO2 94%   BMI 30.52 kg/m   Physical Examination:  General: Well developed, well nourished, NAD  HEENT: OP clear, mucus membranes moist  SKIN: warm, dry. No rashes. Neuro: No focal deficits  Musculoskeletal: Muscle strength 5/5 all ext  Psychiatric: Mood and affect normal  Neck: No JVD, no carotid bruits, no thyromegaly, no lymphadenopathy.  Lungs:Clear bilaterally, no wheezes, rhonci, crackles Cardiovascular: Regular rate and rhythm. No  murmurs, gallops or rubs. Abdomen:Soft. Bowel sounds present. Non-tender.  Extremities: No lower extremity edema. Pulses are 2 + in the bilateral DP/PT.  EKG:  EKG is ordered today. The ekg ordered today demonstrates NSR, rate 69 bpm.   Recent Labs: 02/24/2017: ALT 14; BUN 25; Creat 0.78; Hemoglobin 12.7; Platelets 232; Potassium 4.2; Sodium 137   Lipid Panel    Component Value Date/Time   CHOL 147 02/19/2014 0228   TRIG 521 (H) 02/19/2014 0228   TRIG 1693 (HH) 07/28/2006 1202   HDL 43 02/19/2014 0228   CHOLHDL 3.4 02/19/2014 0228   VLDL UNABLE TO CALCULATE IF TRIGLYCERIDE OVER 400 mg/dL 02/19/2014 0228   LDLCALC UNABLE TO CALCULATE IF TRIGLYCERIDE OVER 400 mg/dL 02/19/2014 0228   LDLDIRECT 45 07/22/2016 1633     Wt Readings from Last 3 Encounters:  06/15/17 183 lb 6.4 oz (83.2 kg)  04/14/17 181 lb (82.1 kg)  02/24/17 182 lb (82.6 kg)     Other studies Reviewed: Additional studies/ records that were reviewed today include:. Review of the above records demonstrates:    Assessment and Plan:   1. CAD without angina: She has no chest pain suggestive of angina.  -Continue ASA, Plavix for lifetime given covered stent and overlapping drug eluting stents. Continue statin and beta blocker.   2. HTN: BP is controlled. No changes today.   3. Hyperlipidemia: Lipids are managed in primary care. LDL 45 in January 2018.Continue statin.   4. Chronic diastolic CHF: Volume status is good today. Will continue daily Lasix.   Current medicines are reviewed at length with the patient today.  The patient does not have concerns regarding medicines.  The following changes have been made:  no change  Labs/ tests ordered today include:  No orders of the defined  types were placed in this encounter.   Disposition:   FU with me in 6 months  Signed, Lauree Chandler, MD 06/15/2017 4:58 PM    [facility] [address], [place], [place]  [address] Phone: [phone]; Fax: [phone]

## 2017-06-16 NOTE — Addendum Note (Signed)
Addended by: Mendel Ryder on: 06/16/2017 02:37 PM   Modules accepted: Orders

## 2017-06-17 ENCOUNTER — Other Ambulatory Visit: Payer: Self-pay | Admitting: Cardiovascular Disease

## 2017-06-17 DIAGNOSIS — I2510 Atherosclerotic heart disease of native coronary artery without angina pectoris: Secondary | ICD-10-CM

## 2017-06-18 ENCOUNTER — Other Ambulatory Visit: Payer: Self-pay | Admitting: Family Medicine

## 2017-06-19 ENCOUNTER — Other Ambulatory Visit: Payer: Self-pay

## 2017-06-19 MED ORDER — POTASSIUM CHLORIDE CRYS ER 20 MEQ PO TBCR: 20.0000 meq | EXTENDED_RELEASE_TABLET | Freq: Every day | ORAL | 10 refills | Status: DC

## 2017-06-20 ENCOUNTER — Telehealth: Payer: Self-pay | Admitting: Cardiovascular Disease

## 2017-06-20 NOTE — Telephone Encounter (Signed)
Pt is calling requesting a refill on pantoprazole 40 mg tablet. Would you like to refill this medication? Please advise

## 2017-06-20 NOTE — Telephone Encounter (Signed)
New Message   *STAT* If patient is at the pharmacy, call can be transferred to refill team.   1. Which medications need to be refilled? (please list name of each medication and dose if known) Pantoprazole 40mg   2. Which pharmacy/location (including street and city if local pharmacy) is medication to be sent to? [facility]   3. Do they need a 30 day or 90 day supply? [place]

## 2017-06-21 ENCOUNTER — Other Ambulatory Visit: Payer: Self-pay | Admitting: *Deleted

## 2017-06-21 NOTE — Telephone Encounter (Signed)
Please send to primary care

## 2017-06-23 ENCOUNTER — Ambulatory Visit: Payer: Medicare HMO | Admitting: Family Medicine

## 2017-06-23 ENCOUNTER — Encounter: Payer: Self-pay | Admitting: Family Medicine

## 2017-06-23 VITALS — BP 138/68 | HR 71 | Temp 98.2°F

## 2017-06-23 DIAGNOSIS — Z794 Long term (current) use of insulin: Secondary | ICD-10-CM

## 2017-06-23 DIAGNOSIS — I5032 Chronic diastolic (congestive) heart failure: Secondary | ICD-10-CM

## 2017-06-23 DIAGNOSIS — E119 Type 2 diabetes mellitus without complications: Secondary | ICD-10-CM | POA: Diagnosis not present

## 2017-06-23 DIAGNOSIS — I10 Essential (primary) hypertension: Secondary | ICD-10-CM

## 2017-06-23 DIAGNOSIS — E785 Hyperlipidemia, unspecified: Secondary | ICD-10-CM | POA: Diagnosis not present

## 2017-06-23 NOTE — Assessment & Plan Note (Signed)
S: well controlled. On lantus 18 units morning, metformin 1g BID CBGs- over last month 100-147 Lab Results  Component Value Date   HGBA1C 6.6 (H) 02/24/2017   HGBA1C 6.2 10/21/2016   HGBA1C 7.0 (H) 07/22/2016   A/P: update a1c today

## 2017-06-23 NOTE — Progress Notes (Signed)
Subjective:  Michelle [name] is a 83 y.o. year old very pleasant female patient who presents for/with See problem oriented charting ROS- No chest pain or shortness of breath. No headache or blurry vision. No edema.    Past Medical History-  Patient Active Problem List   Diagnosis Date Noted  . Chronic diastolic CHF (congestive heart failure) ([place]) 03/13/2014    Priority: High  . CAD- RCA PCI '90s, STEMI-RCA DES 02/18/14 03/05/2014    Priority: High  . IDDM type 2 03/26/2007    Priority: High  . Anemia, iron deficiency 10/23/2015    Priority: Medium  . Osteoarthritis, knee 05/13/2014    Priority: Medium  . Anxiety state 04/21/2014    Priority: Medium  . SOB (shortness of breath) 03/05/2014    Priority: Medium  . Hyperlipidemia with target LDL less than 70 03/26/2007    Priority: Medium  . Essential hypertension 04/21/2006    Priority: Medium  . COPD (chronic obstructive pulmonary disease) ([place]) 04/21/2006    Priority: Medium  . CKD (chronic kidney disease), stage II 07/25/2014    Priority: Low  . Back pain, lumbosacral 10/10/2012    Priority: Low  . Chest pain 12/14/2014    Medications- reviewed and updated Current Outpatient Medications  Medication Sig Dispense Refill  . ACCU-CHEK COMPACT PLUS test strip TEST 3 TIMES DAILY AS DIRECTED. 102 each 5  . ACCU-CHEK FASTCLIX LANCETS MISC     . acetaminophen (TYLENOL) 325 MG tablet Take 650 mg by mouth every 6 (six) hours as needed for mild pain. Reported on 10/23/2015    . ADVAIR DISKUS 250-50 MCG/DOSE AEPB INHALE 1 PUFF INTO THE LUNGS DAILY AS NEEDED. 60 each 3  . albuterol (PROVENTIL HFA;VENTOLIN HFA) 108 (90 Base) MCG/ACT inhaler Inhale 1 puff into the lungs every 4 (four) hours as needed for wheezing or shortness of breath. 1 Inhaler 0  . albuterol (PROVENTIL) (2.5 MG/3ML) 0.083% nebulizer solution TAKE 3 MLS (2.5 MG TOTAL) BY NEBULIZATION EVERY 6 (SIX) HOURS AS NEEDED FOR WHEEZING OR SHORTNESS OF BREATH 75 mL 5  . Alcohol Swabs  (B-D SINGLE USE SWABS REGULAR) PADS     . amLODipine (NORVASC) 10 MG tablet TAKE 1 TABLET (10 MG TOTAL) BY MOUTH DAILY. 90 tablet 3  . aspirin 81 MG tablet Take 81 mg by mouth daily.     . B-D ULTRAFINE III SHORT PEN 31G X 8 MM MISC USE WITH LANTUS SOLOSTAR 100 each 9  . Blood Glucose Calibration (ACCU-CHEK SMARTVIEW CONTROL) LIQD     . carvedilol (COREG) 12.5 MG tablet TAKE 1 TABLET BY MOUTH TWICE DAILY WITH A MEAL 180 tablet 4  . clopidogrel (PLAVIX) 75 MG tablet TAKE 1 TABLET (75 MG TOTAL) BY MOUTH DAILY. 30 tablet 10  . enalapril (VASOTEC) 20 MG tablet TAKE 1 TABLET TWICE DAILY 180 tablet 3  . fenofibrate micronized (LOFIBRA) 134 MG capsule Take 1 capsule (134 mg total) by mouth daily. 90 capsule 3  . ferrous sulfate 325 (65 FE) MG tablet Take 1 tablet (325 mg total) by mouth 2 (two) times daily with a meal. 60 tablet 3  . fish oil-omega-3 fatty acids 1000 MG capsule Take 2 g by mouth daily.      . folic acid (FOLVITE) 161 MCG tablet Take 400 mcg by mouth daily.      . furosemide (LASIX) 40 MG tablet TAKE 1 TABLET EVERY DAY 90 tablet 1  . Insulin Glargine (LANTUS SOLOSTAR) 100 UNIT/ML Solostar Pen Inject 17-20 Units  into the skin every morning. 15 pen 10  . Melatonin 5 MG CAPS Take 1 capsule by mouth at bedtime.     . metFORMIN (GLUCOPHAGE) 1000 MG tablet TAKE 1 TABLET TWICE DAILY 180 tablet 3  . nitroGLYCERIN (NITROSTAT) 0.4 MG SL tablet PLACE 1 TABLET (0.4 MG TOTAL) UNDER THE TONGUE EVERY 5 (FIVE) MINUTES AS NEEDED FOR CHEST PAIN (CHEST PAIN OR SHORTNESS OF BREATH). 25 tablet 4  . pantoprazole (PROTONIX) 40 MG tablet TAKE 1 TABLET (40 MG TOTAL) BY MOUTH DAILY. 90 tablet 3  . Potassium Chloride ER 20 MEQ TBCR TAKE ONE TABLET BY MOUTH DAILY 30 tablet 10  . potassium chloride SA (K-DUR,KLOR-CON) 20 MEQ tablet Take 1 tablet (20 mEq total) by mouth daily. 30 tablet 10  . rosuvastatin (CRESTOR) 40 MG tablet Take 1 tablet (40 mg total) by mouth daily. 90 tablet 2  . sertraline (ZOLOFT) 25 MG  tablet TAKE 1 TABLET EVERY DAY 90 tablet 3  . sodium chloride (OCEAN) 0.65 % SOLN nasal spray Place 1 spray into both nostrils as needed for congestion.    Marland Kitchen SPIRIVA HANDIHALER 18 MCG inhalation capsule PLACE 1 CAPSULE INTO HANDIHALER AND INHALE AS DIRECTED DAILY AT 2:00PM 90 capsule 6  . traMADol (ULTRAM) 50 MG tablet TAKE ONE TABLET BY MOUTH THREE TIMES A DAY AS NEEDED 60 tablet 3  . triamcinolone cream (KENALOG) 0.1 % Apply 1 application topically 2 (two) times daily. 30 g 0  . vitamin E 400 UNIT capsule Take 400 Units by mouth daily.       No current facility-administered medications for this visit.     Objective: BP 138/68   Pulse 71   Temp 98.2 F (36.8 C) (Oral)   SpO2 96%  Gen: NAD, resting comfortably CV: RRR no murmurs rubs or gallops Lungs: CTAB no crackles, wheeze, rhonchi Abdomen: soft/nontender/nondistended/normal bowel sounds. obese Ext: no edema Skin: warm, dry Neuro: in wheelchair  Assessment/Plan:  Hypertension S: controlled on  enalapril 20mg  BID, lasix 40mg , coreg 12.5mg  BID, amlodipine 10mg  BP Readings from Last 3 Encounters:  06/23/17 138/68  06/15/17 126/60  04/14/17 (!) 146/60  A/P: We discussed blood pressure goal of <140/90. Continue current meds   IDDM type 2 S: well controlled. On lantus 18 units morning, metformin 1g BID CBGs- over last month 100-147 Lab Results  Component Value Date   HGBA1C 6.6 (H) 02/24/2017   HGBA1C 6.2 10/21/2016   HGBA1C 7.0 (H) 07/22/2016   A/P: update a1c today  Chronic diastolic CHF (congestive heart failure) S: weight up 1 lb. No edema. No shortness of breath. Over last month weight has fluctuated within 1 lb- she has done a great job monitoring at home.  A/P: continue daily lasix  Hyperlipidemia with target LDL less than 70 S: well controlled on crestor 40mg  with last LDL 45 in january. No myalgias.   A/P: update LDL with goal under 70  Future Appointments  Date Time Provider [place]  04/17/2018   1:00 PM Williemae Area, RN [facility]   Return in about 4 months (around 10/22/2017) for physical, come fasting.  Orders Placed This Encounter  Procedures  . LDL cholesterol, direct    [place]    Standing Status:   Future    Number of Occurrences:   1    Standing Expiration Date:   06/23/2018  . Hemoglobin A1c    [place]    Standing Status:   Future    Number of Occurrences:   1  Standing Expiration Date:   06/23/2018  . Basic metabolic panel    Standing Status:   Future    Number of Occurrences:   1    Standing Expiration Date:   06/23/2018   Return precautions advised.  [name], MD

## 2017-06-23 NOTE — Assessment & Plan Note (Signed)
S: well controlled on crestor 40mg  with last LDL 45 in january. No myalgias.   A/P: update LDL with goal under 70

## 2017-06-23 NOTE — Patient Instructions (Signed)
Please stop by lab before you go  I am thrilled you have been doing so well! Keep up the good work

## 2017-06-23 NOTE — Assessment & Plan Note (Signed)
S: weight up 1 lb. No edema. No shortness of breath. Over last month weight has fluctuated within 1 lb- she has done a great job monitoring at home.  A/P: continue daily lasix

## 2017-06-24 LAB — BASIC METABOLIC PANEL
BUN: 17 mg/dL (ref 7–25)
CO2: 29 mmol/L (ref 20–32)
CREATININE: 0.8 mg/dL (ref 0.60–0.88)
Calcium: 9.8 mg/dL (ref 8.6–10.4)
Chloride: 100 mmol/L (ref 98–110)
GLUCOSE: 125 mg/dL — AB (ref 65–99)
Potassium: 4.4 mmol/L (ref 3.5–5.3)
Sodium: 138 mmol/L (ref 135–146)

## 2017-06-24 LAB — HEMOGLOBIN A1C
HEMOGLOBIN A1C: 6.6 %{Hb} — AB (ref <5.7)
Mean Plasma Glucose: 143 (calc)
eAG (mmol/L): 7.9 (calc)

## 2017-06-24 LAB — LDL CHOLESTEROL, DIRECT: Direct LDL: 38 mg/dL (ref <100)

## 2017-06-24 NOTE — Progress Notes (Signed)
Please let us know by responding to this when you get this mychart message and let us know if you understand the results/directions  Kidney and electrolytes normal A1c looks great again at 6.6 below goal of 7.  Bad cholesterol remains at goal under 70. All good news. No changes in meds

## 2017-06-30 ENCOUNTER — Telehealth: Payer: Self-pay | Admitting: Family Medicine

## 2017-06-30 ENCOUNTER — Other Ambulatory Visit: Payer: Self-pay | Admitting: *Deleted

## 2017-06-30 MED ORDER — PANTOPRAZOLE SODIUM 40 MG PO TBEC: DELAYED_RELEASE_TABLET | ORAL | 3 refills | Status: DC

## 2017-06-30 NOTE — Telephone Encounter (Signed)
[facility] was [facility] today.

## 2017-06-30 NOTE — Telephone Encounter (Signed)
Copied from [place]. Topic: Quick Communication - Rx Refill/Question >> Jun 30, 2017 12:51 PM Patrice Paradise wrote: Has the patient contacted their pharmacy? Yes.   pantoprazole (PROTONIX) 40 MG tablet (DR HUNTER WAS SUPPOSE TO SEND THE RX TO THE PHARMACY ON 06/23/17, PER PATIENT SON DAVID).  (Agent: If no, request that the patient contact the pharmacy for the refill.)  Preferred Pharmacy (with phone number or street name):  [facility] [address] [place] - [address] [place] [address] Phone: [phone] Fax: [phone]   Agent: Please be advised that RX refills may take up to 3 business days. We ask that you follow-up with your pharmacy.

## 2017-07-14 ENCOUNTER — Other Ambulatory Visit: Payer: Self-pay | Admitting: Family Medicine

## 2017-08-09 ENCOUNTER — Other Ambulatory Visit: Payer: Self-pay | Admitting: Family Medicine

## 2017-08-24 ENCOUNTER — Other Ambulatory Visit: Payer: Self-pay | Admitting: Family Medicine

## 2017-08-28 ENCOUNTER — Other Ambulatory Visit: Payer: Self-pay | Admitting: Family Medicine

## 2017-08-29 ENCOUNTER — Other Ambulatory Visit: Payer: Self-pay | Admitting: Cardiovascular Disease

## 2017-08-29 DIAGNOSIS — I2510 Atherosclerotic heart disease of native coronary artery without angina pectoris: Secondary | ICD-10-CM

## 2017-08-29 MED ORDER — CLOPIDOGREL BISULFATE 75 MG PO TABS: 75.0000 mg | ORAL_TABLET | Freq: Every day | ORAL | 3 refills | Status: DC

## 2017-10-20 ENCOUNTER — Other Ambulatory Visit: Payer: Self-pay | Admitting: Family Medicine

## 2017-10-29 ENCOUNTER — Other Ambulatory Visit: Payer: Self-pay | Admitting: Family Medicine

## 2017-11-17 ENCOUNTER — Encounter: Payer: Self-pay | Admitting: Family Medicine

## 2017-11-17 ENCOUNTER — Ambulatory Visit (INDEPENDENT_AMBULATORY_CARE_PROVIDER_SITE_OTHER): Payer: Medicare HMO | Admitting: Family Medicine

## 2017-11-17 VITALS — BP 134/80 | HR 70 | Temp 98.6°F | Ht 65.0 in | Wt 182.0 lb

## 2017-11-17 DIAGNOSIS — Z780 Asymptomatic menopausal state: Secondary | ICD-10-CM | POA: Diagnosis not present

## 2017-11-17 DIAGNOSIS — I10 Essential (primary) hypertension: Secondary | ICD-10-CM

## 2017-11-17 DIAGNOSIS — L570 Actinic keratosis: Secondary | ICD-10-CM | POA: Diagnosis not present

## 2017-11-17 DIAGNOSIS — Z0000 Encounter for general adult medical examination without abnormal findings: Secondary | ICD-10-CM | POA: Diagnosis not present

## 2017-11-17 DIAGNOSIS — I5032 Chronic diastolic (congestive) heart failure: Secondary | ICD-10-CM | POA: Diagnosis not present

## 2017-11-17 DIAGNOSIS — Z0001 Encounter for general adult medical examination with abnormal findings: Secondary | ICD-10-CM

## 2017-11-17 DIAGNOSIS — J449 Chronic obstructive pulmonary disease, unspecified: Secondary | ICD-10-CM

## 2017-11-17 DIAGNOSIS — Z794 Long term (current) use of insulin: Secondary | ICD-10-CM | POA: Diagnosis not present

## 2017-11-17 DIAGNOSIS — E119 Type 2 diabetes mellitus without complications: Secondary | ICD-10-CM | POA: Diagnosis not present

## 2017-11-17 DIAGNOSIS — I25118 Atherosclerotic heart disease of native coronary artery with other forms of angina pectoris: Secondary | ICD-10-CM

## 2017-11-17 DIAGNOSIS — E785 Hyperlipidemia, unspecified: Secondary | ICD-10-CM | POA: Diagnosis not present

## 2017-11-17 LAB — POC URINALSYSI DIPSTICK (AUTOMATED)
Bilirubin, UA: NEGATIVE
Blood, UA: NEGATIVE
GLUCOSE UA: NEGATIVE
Ketones, UA: NEGATIVE
Leukocytes, UA: NEGATIVE
NITRITE UA: NEGATIVE
PROTEIN UA: NEGATIVE
Spec Grav, UA: 1.015 (ref 1.010–1.025)
UROBILINOGEN UA: 0.2 U/dL
pH, UA: 7 (ref 5.0–8.0)

## 2017-11-17 NOTE — Addendum Note (Signed)
Addended by: Kayren Eaves T on: 11/17/2017 03:25 PM   Modules accepted: Orders

## 2017-11-17 NOTE — Assessment & Plan Note (Signed)
Red patch on right forearm with some scaling on top. She states this has grown over last year- looks to be [place]. Will treat with cryotherapy

## 2017-11-17 NOTE — Patient Instructions (Addendum)
discussed Shingrix at pharmacy- let us know if you get this  Schedule your bone density test at check out desk. You may also call directly to X-ray at [phone] to schedule an appointment that is convenient for you.  - located [address] across the street from [place] - in the basement - you do need an appointment for the bone density tests.

## 2017-11-17 NOTE — Progress Notes (Signed)
Phone: [phone]  Subjective:  Patient presents today for their annual physical. Chief complaint-noted.   See problem oriented charting- ROS- full  review of systems was completed and negative except for: Dental problems, nosebleeds, sneezing, sore throat, cough, shortness of breath at baseline, wheezing at times, increased thirst and increased urination on Lasix, urinary frequency on Lasix, dizziness at times, lightheadedness at times, headaches, weakness, easy bruising or bleeding, decreased concentration  The following were reviewed and entered/updated in epic: Past Medical History:  Diagnosis Date  . Anginal pain ([place])   . Arthritis   . AV block, 1st degree   . CAD (coronary artery disease) 1990; 2015   Cardiac cath 1990 with Dr. Lia Foyer and [name] Residual CAD, non obstructive per 2015 cath. STEMI status post stent in August 2015.  Marland Kitchen COPD (chronic obstructive pulmonary disease) ([place])   . Diabetes mellitus type 2, insulin dependent ([place])   . Essential hypertension   . Hyperlipidemia with target LDL less than 70   . Myocardial infarction ([place])   . Pericarditis-post MI (short course of steroids) 03/06/2014  . 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 Shortness of breath    Patient Active Problem List   Diagnosis Date Noted  . Chronic diastolic CHF (congestive heart failure) ([place]) 03/13/2014    Priority: High  . CAD- RCA PCI '90s, STEMI-RCA DES 02/18/14 03/05/2014    Priority: High  . IDDM type 2 03/26/2007    Priority: High  . Anemia, iron deficiency 10/23/2015    Priority: Medium  . Osteoarthritis, knee 05/13/2014    Priority: Medium  . Anxiety state 04/21/2014    Priority: Medium  . SOB (shortness of breath) 03/05/2014    Priority: Medium  .  Hyperlipidemia with target LDL less than 70 03/26/2007    Priority: Medium  . Essential hypertension 04/21/2006    Priority: Medium  . COPD (chronic obstructive pulmonary disease) ([place]) 04/21/2006    Priority: Medium  . Actinic keratosis 11/17/2017    Priority: Low  . CKD (chronic kidney disease), stage II 07/25/2014    Priority: Low  . Back pain, lumbosacral 10/10/2012    Priority: Low  . Chest pain 12/14/2014   Past Surgical History:  Procedure Laterality Date  . CATARACT EXTRACTION  2010  . CHOLECYSTECTOMY    . CORONARY ANGIOPLASTY WITH STENT PLACEMENT  02/18/14   Promus DES to RCA  . LEFT HEART CATHETERIZATION WITH CORONARY ANGIOGRAM N/A 02/18/2014   Procedure: LEFT HEART CATHETERIZATION WITH CORONARY ANGIOGRAM;  Surgeon: Jettie Booze, MD;  Location: [facility] CATH LAB;  Service: Cardiovascular;  Laterality: N/A;  . PTCA  1990   PTCA of RCA  . TRANSTHORACIC ECHOCARDIOGRAM  02/02/2012   mild LVH, EF 55-60%, Normal WM, Gr 1 DD; Mild MR    Family History  Problem Relation Age of Onset  . Heart attack Mother 4  . Cancer Father 9  . Heart attack Son     Medications- reviewed and updated Current Outpatient Medications  Medication Sig Dispense Refill  . ACCU-CHEK FASTCLIX LANCETS MISC     . ACCU-CHEK SMARTVIEW test strip TEST THREE TIMES DAILY 300 each 1  . acetaminophen (TYLENOL) 325 MG tablet Take 650 mg by mouth every 6 (six) hours as needed for  mild pain. Reported on 10/23/2015    . ADVAIR DISKUS 250-50 MCG/DOSE AEPB INHALE 1 PUFF INTO THE LUNGS DAILY AS NEEDED. 60 each 2  . albuterol (PROVENTIL HFA;VENTOLIN HFA) 108 (90 Base) MCG/ACT inhaler Inhale 1 puff into the lungs every 4 (four) hours as needed for wheezing or shortness of breath. 1 Inhaler 0  . albuterol (PROVENTIL) (2.5 MG/3ML) 0.083% nebulizer solution TAKE 3 MLS (2.5 MG TOTAL) BY NEBULIZATION EVERY 6 (SIX) HOURS AS NEEDED FOR WHEEZING OR SHORTNESS OF BREATH 75 mL 5  . Alcohol Swabs (B-D SINGLE USE SWABS REGULAR)  PADS     . amLODipine (NORVASC) 10 MG tablet TAKE 1 TABLET EVERY DAY 90 tablet 3  . aspirin 81 MG tablet Take 81 mg by mouth daily.     . B-D ULTRAFINE III SHORT PEN 31G X 8 MM MISC USE AS DIRECTED WITH LANTUS SOLOSTAR 90 each 10  . Blood Glucose Calibration (ACCU-CHEK SMARTVIEW CONTROL) LIQD     . carvedilol (COREG) 12.5 MG tablet TAKE 1 TABLET BY MOUTH TWICE DAILY WITH A MEAL 180 tablet 4  . clopidogrel (PLAVIX) 75 MG tablet Take 1 tablet (75 mg total) by mouth daily. 90 tablet 3  . enalapril (VASOTEC) 20 MG tablet TAKE 1 TABLET TWICE DAILY 180 tablet 3  . fenofibrate micronized (LOFIBRA) 134 MG capsule TAKE 1 CAPSULE EVERY DAY 90 capsule 3  . ferrous sulfate 325 (65 FE) MG tablet Take 1 tablet (325 mg total) by mouth 2 (two) times daily with a meal. 60 tablet 3  . fish oil-omega-3 fatty acids 1000 MG capsule Take 2 g by mouth daily.      . folic acid (FOLVITE) 527 MCG tablet Take 400 mcg by mouth daily.      . furosemide (LASIX) 40 MG tablet TAKE 1 TABLET EVERY DAY 90 tablet 1  . LANTUS SOLOSTAR 100 UNIT/ML Solostar Pen INJECT  17  TO  20 UNITS SUBCUTANEOUSLY EVERY MORNING 30 mL 10  . Melatonin 5 MG CAPS Take 1 capsule by mouth at bedtime.     . metFORMIN (GLUCOPHAGE) 1000 MG tablet TAKE 1 TABLET TWICE DAILY 180 tablet 3  . nitroGLYCERIN (NITROSTAT) 0.4 MG SL tablet PLACE 1 TABLET (0.4 MG TOTAL) UNDER THE TONGUE EVERY 5 (FIVE) MINUTES AS NEEDED FOR CHEST PAIN (CHEST PAIN OR SHORTNESS OF BREATH). 25 tablet 4  . pantoprazole (PROTONIX) 40 MG tablet TAKE 1 TABLET (40 MG TOTAL) BY MOUTH DAILY. 90 tablet 3  . Potassium Chloride ER 20 MEQ TBCR TAKE ONE TABLET BY MOUTH DAILY 30 tablet 10  . potassium chloride SA (K-DUR,KLOR-CON) 20 MEQ tablet Take 1 tablet (20 mEq total) by mouth daily. 30 tablet 10  . rosuvastatin (CRESTOR) 40 MG tablet TAKE 1 TABLET EVERY DAY 90 tablet 2  . sertraline (ZOLOFT) 25 MG tablet TAKE 1 TABLET EVERY DAY 90 tablet 3  . sodium chloride (OCEAN) 0.65 % SOLN nasal spray  Place 1 spray into both nostrils as needed for congestion.    Marland Kitchen SPIRIVA HANDIHALER 18 MCG inhalation capsule PLACE 1 CAPSULE INTO HANDIHALER AND INHALE AS DIRECTED DAILY AT 2:00PM 90 capsule 6  . traMADol (ULTRAM) 50 MG tablet TAKE ONE TABLET BY MOUTH THREE TIMES A DAY AS NEEDED 60 tablet 2  . triamcinolone cream (KENALOG) 0.1 % Apply 1 application topically 2 (two) times daily. 30 g 0  . vitamin E 400 UNIT capsule Take 400 Units by mouth daily.       No current facility-administered medications for  this visit.     Allergies-reviewed and updated Allergies  Allergen Reactions  . Codeine Sulfate Nausea Only  . Morphine Sulfate Nausea Only    Social History   Social History Narrative   Lives with her son but he works all day. Home for dinner.  Independent ADL. Needs assist on some IADL.     Objective: BP 134/80 (BP Location: Left Arm, Patient Position: Sitting, Cuff Size: Normal)   Pulse 70   Temp 98.6 F (37 C) (Oral)   Ht 5\' 5"  (1.651 m)   Wt 182 lb (82.6 kg)   SpO2 96%   BMI 30.29 kg/m   Gen: NAD, resting comfortably HEENT: Mucous membranes are moist. Oropharynx normal Neck: no thyromegaly CV: RRR no murmurs rubs or gallops Lungs: CTAB no crackles, wheeze, rhonchi Abdomen: soft/nontender/nondistended/normal bowel sounds. No rebound or guarding.  Ext: no edema Skin: warm, dry. Red patch on right forearm with some scaling on top. Neuro: uses wheelchair moves all extremities, PERRLA  Diabetic Foot Exam - Simple   Simple Foot Form Diabetic Foot exam was performed with the following findings:  Yes 11/17/2017  3:01 PM  Visual Inspection No deformities, no ulcerations, no other skin breakdown bilaterally:  Yes Sensation Testing Intact to touch and monofilament testing bilaterally:  Yes Pulse Check Posterior Tibialis and Dorsalis pulse intact bilaterally:  Yes Comments     Procedure note: Benefits and risks verbally discussed with patient 10 second freeze thaw cycle of  cryotherapy performed  with liquid nitrogen to right forearm AK No complications.  Patient tolerated the procedure well other than mild pain. Gave handout on this from [place] and we reviewed this content thoroughly [email or web address]  Assessment/Plan:  84 y.o. female presenting for annual physical.  Health Maintenance counseling: 1. Anticipatory guidance: Patient counseled regarding regular dental exams -q6 months (advised her to go as hasnt been going), eye exams - yearly, wearing seatbelts.  2. Risk factor reduction:  Advised patient of need for regular exercise and diet rich and fruits and vegetables to reduce risk of heart attack and stroke. Exercise- not exercising- advised at least do arm exercises. Diet-weight stable- she is not highly motivated to lose weight- could cut down on chocolate.  3. Immunizations/screenings/ancillary studies-discussed Shingrix at pharmacy  Immunization History  Administered Date(s) Administered  . Influenza Split 04/21/2011, 05/16/2012  . Influenza Whole 05/24/2007, 05/01/2009, 04/17/2010  . Influenza,inj,Quad PF,6+ Mos 04/14/2013, 04/10/2014, 04/03/2015  . Influenza-Unspecified 06/01/2016, 06/15/2017  . Pneumococcal Conjugate-13 09/22/2014  . Pneumococcal Polysaccharide-23 04/18/2003  . Tdap 07/29/2011  . Zoster 04/18/2011  4. Cervical cancer screening- passed age based screening.  No vaginal bleeding. No history abnormal spot 5. Breast cancer screening-passed age based screening.  Her last mammogram was in 2004.  She denies breast pain or lumps. 6. Colon cancer screening - she has passed age based screening.  No rectal bleeding or melena. We did review colonoscopy in 2004 which showed adenoma x1. She does not want to pursue colonoscopy due to age and comorbidities- she is aware of the risk of colon cancer 7. Skin cancer screening- no dermatologist recently. advised regular sunscreen use.  Denies worrisome, changing, or new skin lesions.  8. Birth control/STD check- postmenopausal/not sexually active 9. Osteoporosis screening at 20- opts in finally- had declined previously  Diabetic Foot Exam - Simple   Simple Foot Form Diabetic Foot exam was performed with the following findings:  Yes 11/17/2017  3:01 PM  Visual Inspection No deformities, no ulcerations, no other skin breakdown bilaterally:  Yes Sensation Testing Intact to touch and monofilament testing bilaterally:  Yes Pulse Check Posterior Tibialis and Dorsalis pulse intact bilaterally:  Yes Comments    Status of chronic or acute concerns   CAD-given her cardiac history she remains on Plavix, Crestor, fenofibrate  Hypertension remains controlled on enalapril 20 mg twice a day, Lasix 40 mg, Coreg 12.5 mg twice a day, amlodipine 10 mg  Diabetes has been well controlled on Lantus 18 units in the morning with metformin 1 g twice daily.  We will update A1c. CBGs range from 110-170 fasting.   Diastolic CHF.  Weight down a pound from last visit.  Edema stable.  She remains on her daily Lasix 40 mg.  She uses potassium 20 mEq daily for replacement  Hyperlipidemia has been well controlled on Crestor 40 mg and fenofibrate with LDL of 38 on last check  Her copd remains well controlled on Advair daily along with Spiriva.  She also uses albuterol as needed sparingly- used today in getting to the office  She remains on Protonix daily   For anxiety- she remains on low-dose Zoloft 25 mg  She uses tramadol for her knee pain.  Actinic keratosis Red patch on right forearm with some scaling on top. She states this has grown over last year- looks to be AK. Will treat with cryotherapy    Future Appointments  Date Time Provider [place]  12/20/2017  3:40 PM Burnell Blanks, MD [facility]  04/17/2018  1:00 PM Williemae Area, RN [facility]   Return in about 4 months (around 03/20/2018) for  follow up- or sooner if needed.  Lab/Order associations: Preventative health care - Plan: CBC, Comprehensive metabolic panel, Lipid panel, Hemoglobin A1c, POCT Urinalysis Dipstick (Automated)  Postmenopausal - Plan: DG Bone Density  IDDM type 2 - Plan: CBC, Comprehensive metabolic panel, Lipid panel, Hemoglobin A1c, POCT Urinalysis Dipstick (Automated)  Hyperlipidemia with target LDL less than 70 - Plan: CBC, Comprehensive metabolic panel, Lipid panel, POCT Urinalysis Dipstick (Automated)  Return precautions advised.  [name], MD

## 2017-11-18 LAB — CBC
HEMATOCRIT: 36.4 % (ref 35.0–45.0)
Hemoglobin: 12.7 g/dL (ref 11.7–15.5)
MCH: 29.9 pg (ref 27.0–33.0)
MCHC: 34.9 g/dL (ref 32.0–36.0)
MCV: 85.6 fL (ref 80.0–100.0)
MPV: 14.7 fL — ABNORMAL HIGH (ref 7.5–12.5)
Platelets: 193 10*3/uL (ref 140–400)
RBC: 4.25 10*6/uL (ref 3.80–5.10)
RDW: 12.8 % (ref 11.0–15.0)
WBC: 6.6 10*3/uL (ref 3.8–10.8)

## 2017-11-18 LAB — COMPREHENSIVE METABOLIC PANEL
AG RATIO: 1.5 (calc) (ref 1.0–2.5)
ALBUMIN MSPROF: 4.1 g/dL (ref 3.6–5.1)
ALKALINE PHOSPHATASE (APISO): 55 U/L (ref 33–130)
ALT: 14 U/L (ref 6–29)
AST: 17 U/L (ref 10–35)
BILIRUBIN TOTAL: 0.4 mg/dL (ref 0.2–1.2)
BUN: 16 mg/dL (ref 7–25)
CALCIUM: 9.6 mg/dL (ref 8.6–10.4)
CO2: 28 mmol/L (ref 20–32)
Chloride: 102 mmol/L (ref 98–110)
Creat: 0.78 mg/dL (ref 0.60–0.88)
Globulin: 2.7 g/dL (calc) (ref 1.9–3.7)
Glucose, Bld: 119 mg/dL — ABNORMAL HIGH (ref 65–99)
POTASSIUM: 3.9 mmol/L (ref 3.5–5.3)
SODIUM: 140 mmol/L (ref 135–146)
Total Protein: 6.8 g/dL (ref 6.1–8.1)

## 2017-11-18 LAB — LIPID PANEL
CHOLESTEROL: 125 mg/dL (ref <200)
HDL: 45 mg/dL — AB (ref >50)
LDL CHOLESTEROL (CALC): 41 mg/dL
Non-HDL Cholesterol (Calc): 80 mg/dL (calc) (ref <130)
Total CHOL/HDL Ratio: 2.8 (calc) (ref <5.0)
Triglycerides: 368 mg/dL — ABNORMAL HIGH (ref <150)

## 2017-11-18 LAB — HEMOGLOBIN A1C
Hgb A1c MFr Bld: 7 % of total Hgb — ABNORMAL HIGH (ref <5.7)
Mean Plasma Glucose: 154 (calc)
eAG (mmol/L): 8.5 (calc)

## 2017-11-20 NOTE — Progress Notes (Signed)
Your hemoglobin A1c is up to 7- try to improve on the foods you are eating and regularity of exercise.  We will not make any changes to your medications unless your A1c gets above 7. Your CBC was Largely normal (blood counts, infection fighting cells, platelets). Your CMET was normal (kidney, liver, and electrolytes, blood sugar) for someone with diabetes.  Your cholesterol looked better than previous checks with triglycerides under 500 and bad cholesterol under 70.

## 2017-11-22 ENCOUNTER — Telehealth: Payer: Self-pay

## 2017-11-22 NOTE — Telephone Encounter (Signed)
Called patient and left a message to call office back.  

## 2017-12-07 ENCOUNTER — Other Ambulatory Visit: Payer: Self-pay

## 2017-12-07 DIAGNOSIS — I2510 Atherosclerotic heart disease of native coronary artery without angina pectoris: Secondary | ICD-10-CM

## 2017-12-07 MED ORDER — CLOPIDOGREL BISULFATE 75 MG PO TABS: 75.0000 mg | ORAL_TABLET | Freq: Every day | ORAL | 1 refills | Status: DC

## 2017-12-20 ENCOUNTER — Ambulatory Visit: Payer: Medicare HMO | Admitting: Cardiovascular Disease

## 2018-01-22 ENCOUNTER — Other Ambulatory Visit: Payer: Self-pay | Admitting: Family Medicine

## 2018-01-25 ENCOUNTER — Other Ambulatory Visit: Payer: Self-pay | Admitting: Family Medicine

## 2018-01-25 NOTE — Telephone Encounter (Signed)
Copied from [place] [id number]. Topic: Quick Communication - Rx Refill/Question >> Jan 25, 2018  4:49 PM Jarold Motto, Fraser Din wrote: Medication: traMADol (ULTRAM) 50 MG tablet pt stated that he pharmacy called meds in on 01/22/18   Has the patient contacted their pharmacy? Yes  Preferred Pharmacy (with phone number or street name):  [facility] [address], [place] - [address] [phone] (Phone) [phone] (Fax)   Agent: Please be advised that RX refills may take up to 3 business days. We ask that you follow-up with your pharmacy.

## 2018-01-25 NOTE — Telephone Encounter (Signed)
Refill request, pt last seen 11/2017 for physical.

## 2018-01-26 NOTE — Telephone Encounter (Signed)
Called [name] spoke to pharmacist Anderson Malta asked her if received Rx for Tramadol that Dr. Yong Channel sent over yesterday? Anderson Malta said yes and she will call and notify the patient.

## 2018-02-19 ENCOUNTER — Other Ambulatory Visit: Payer: Self-pay | Admitting: Family Medicine

## 2018-02-26 ENCOUNTER — Other Ambulatory Visit: Payer: Self-pay | Admitting: Cardiovascular Disease

## 2018-03-16 ENCOUNTER — Other Ambulatory Visit: Payer: Self-pay | Admitting: *Deleted

## 2018-03-16 ENCOUNTER — Other Ambulatory Visit: Payer: Self-pay | Admitting: Cardiovascular Disease

## 2018-03-16 MED ORDER — NITROGLYCERIN 0.4 MG SL SUBL: SUBLINGUAL_TABLET | SUBLINGUAL | 4 refills | Status: DC

## 2018-03-16 MED ORDER — PANTOPRAZOLE SODIUM 40 MG PO TBEC: DELAYED_RELEASE_TABLET | ORAL | 3 refills | Status: DC

## 2018-03-19 ENCOUNTER — Other Ambulatory Visit: Payer: Self-pay | Admitting: Cardiovascular Disease

## 2018-03-22 ENCOUNTER — Ambulatory Visit: Payer: Medicare HMO | Admitting: Cardiovascular Disease

## 2018-03-22 ENCOUNTER — Encounter: Payer: Self-pay | Admitting: Cardiovascular Disease

## 2018-03-22 VITALS — BP 140/70 | Ht 65.0 in | Wt 184.0 lb

## 2018-03-22 DIAGNOSIS — I5032 Chronic diastolic (congestive) heart failure: Secondary | ICD-10-CM | POA: Diagnosis not present

## 2018-03-22 DIAGNOSIS — I10 Essential (primary) hypertension: Secondary | ICD-10-CM | POA: Diagnosis not present

## 2018-03-22 DIAGNOSIS — E7800 Pure hypercholesterolemia, unspecified: Secondary | ICD-10-CM

## 2018-03-22 DIAGNOSIS — I2510 Atherosclerotic heart disease of native coronary artery without angina pectoris: Secondary | ICD-10-CM | POA: Diagnosis not present

## 2018-03-22 NOTE — Progress Notes (Signed)
Chief Complaint  Patient presents with  . Follow-up    CAD    History of Present Illness: 84 yo female with a history of diastolic CHF, CAD s/p PTCA of a large dominant RCA in 1990 and inferior STEMI August 2015, DM, HTN, HLD, COPD, former tobacco abuse here today for cardiac follow up. She was admitted with an inferior STEMI in August 2015 and emergent cardiac cath demonstrated an occluded RCA with aneurysm formation at the prior angioplasty site. The aneurysmal segment was treated with a covered stent. 2 drug-eluting stents (one proximal and one distal to the covered stent) were placed. There was a small pericardial effusion noted post MI. She was discharged on a short course of steroids and follow up echo demonstrated just trivial effusion. Echo August 2015 showed normal LVEF and no significant pericardial effusion.She was readmitted one week later with acute CHF and diuresis was complicated by AKI and ACEI was held. She became dyspneic on Brilinta and she was changed to Effient.   She is here today for follow up. The patient denies any dyspnea, palpitations, lower extremity edema, orthopnea, PND, dizziness, near syncope or syncope. She has rare sharp chest pains every few months. No exertional chest pain.   Primary Care Physician: [name], MD   Past Medical History:  Diagnosis Date  . Anginal pain ([place])   . Arthritis   . AV block, 1st degree   . CAD (coronary artery disease) 1990; 2015   Cardiac cath 1990 with Dr. Lia Foyer and [name] Residual CAD, non obstructive per 2015 cath. STEMI status post stent in August 2015.  Marland Kitchen COPD (chronic obstructive pulmonary disease) ([place])   . Diabetes mellitus type 2, insulin dependent ([place])   . Essential hypertension   . Hyperlipidemia with target LDL less than 70   . Myocardial  infarction ([place])   . Pericarditis-post MI (short course of steroids) 03/06/2014  . 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 Shortness of breath     Past Surgical History:  Procedure Laterality Date  . CATARACT EXTRACTION  2010  . CHOLECYSTECTOMY    . CORONARY ANGIOPLASTY WITH STENT PLACEMENT  02/18/14   Promus DES to RCA  . LEFT HEART CATHETERIZATION WITH CORONARY ANGIOGRAM N/A 02/18/2014   Procedure: LEFT HEART CATHETERIZATION WITH CORONARY ANGIOGRAM;  Surgeon: Jettie Booze, MD;  Location: [facility] CATH LAB;  Service: Cardiovascular;  Laterality: N/A;  . PTCA  1990   PTCA of RCA  . TRANSTHORACIC ECHOCARDIOGRAM  02/02/2012   mild LVH, EF 55-60%, Normal WM, Gr 1 DD; Mild MR    Current Outpatient Medications  Medication Sig Dispense Refill  . ACCU-CHEK FASTCLIX LANCETS MISC     . ACCU-CHEK SMARTVIEW test strip TEST THREE TIMES DAILY 300 each 1  . acetaminophen (TYLENOL) 325 MG tablet Take 650 mg by mouth every 6 (six) hours as needed for mild pain. Reported on 10/23/2015    . ADVAIR DISKUS 250-50 MCG/DOSE AEPB INHALE 1 PUFF INTO THE LUNGS DAILY AS NEEDED. 60 each 2  . albuterol (PROVENTIL HFA;VENTOLIN HFA) 108 (90 Base) MCG/ACT inhaler Inhale 1 puff into the lungs every 4 (four) hours as needed for wheezing or shortness of breath. 1 Inhaler 0  . albuterol (PROVENTIL) (2.5 MG/3ML)  0.083% nebulizer solution TAKE 3 MLS (2.5 MG TOTAL) BY NEBULIZATION EVERY 6 (SIX) HOURS AS NEEDED FOR WHEEZING OR SHORTNESS OF BREATH 75 mL 5  . Alcohol Swabs (B-D SINGLE USE SWABS REGULAR) PADS     . amLODipine (NORVASC) 10 MG tablet TAKE 1 TABLET EVERY DAY 90 tablet 3  . aspirin 81 MG tablet Take 81 mg by mouth daily.     . B-D ULTRAFINE III SHORT PEN 31G X 8 MM MISC USE AS DIRECTED WITH LANTUS SOLOSTAR 90 each 10  . Blood Glucose Calibration (ACCU-CHEK SMARTVIEW CONTROL) LIQD     . carvedilol (COREG) 12.5 MG tablet TAKE 1 TABLET BY MOUTH  TWICE DAILY WITH A MEAL 180 tablet 4  . clopidogrel (PLAVIX) 75 MG tablet Take 1 tablet (75 mg total) by mouth daily. 90 tablet 1  . enalapril (VASOTEC) 20 MG tablet TAKE 1 TABLET TWICE DAILY 180 tablet 3  . fenofibrate micronized (LOFIBRA) 134 MG capsule TAKE 1 CAPSULE EVERY DAY 90 capsule 3  . ferrous sulfate 325 (65 FE) MG tablet Take 1 tablet (325 mg total) by mouth 2 (two) times daily with a meal. 60 tablet 3  . fish oil-omega-3 fatty acids 1000 MG capsule Take 2 g by mouth daily.      . folic acid (FOLVITE) 371 MCG tablet Take 400 mcg by mouth daily.      . furosemide (LASIX) 40 MG tablet TAKE 1 TABLET EVERY DAY 90 tablet 1  . LANTUS SOLOSTAR 100 UNIT/ML Solostar Pen INJECT  17  TO  20 UNITS SUBCUTANEOUSLY EVERY MORNING 30 mL 10  . Melatonin 5 MG CAPS Take 1 capsule by mouth at bedtime.     . metFORMIN (GLUCOPHAGE) 1000 MG tablet TAKE 1 TABLET TWICE DAILY 180 tablet 3  . nitroGLYCERIN (NITROSTAT) 0.4 MG SL tablet PLACE 1 TABLET (0.4 MG TOTAL) UNDER THE TONGUE EVERY 5 (FIVE) MINUTES AS NEEDED FOR CHEST PAIN (CHEST PAIN OR SHORTNESS OF BREATH). 25 tablet 4  . pantoprazole (PROTONIX) 40 MG tablet TAKE 1 TABLET (40 MG TOTAL) BY MOUTH DAILY. 90 tablet 3  . Potassium Chloride ER 20 MEQ TBCR TAKE ONE TABLET BY MOUTH DAILY 30 tablet 10  . potassium chloride SA (K-DUR,KLOR-CON) 20 MEQ tablet Take 1 tablet (20 mEq total) by mouth daily. 30 tablet 10  . rosuvastatin (CRESTOR) 40 MG tablet TAKE 1 TABLET EVERY DAY 90 tablet 2  . sertraline (ZOLOFT) 25 MG tablet TAKE 1 TABLET EVERY DAY 90 tablet 3  . sodium chloride (OCEAN) 0.65 % SOLN nasal spray Place 1 spray into both nostrils as needed for congestion.    Marland Kitchen SPIRIVA HANDIHALER 18 MCG inhalation capsule PLACE 1 CAPSULE INTO HANDIHALER AND INHALE AS DIRECTED DAILY AT 2:00PM 90 capsule 6  . traMADol (ULTRAM) 50 MG tablet TAKE ONE TABLET BY MOUTH THREE TIMES A DAY AS NEEDED 60 tablet 1  . triamcinolone cream (KENALOG) 0.1 % Apply 1 application topically 2  (two) times daily. 30 g 0  . vitamin E 400 UNIT capsule Take 400 Units by mouth daily.       No current facility-administered medications for this visit.     Allergies  Allergen Reactions  . Codeine Sulfate Nausea Only  . Morphine Sulfate Nausea Only    Social History   Socioeconomic History  . Marital status: Married    Spouse name: Not on file  . Number of children: 2  . Years of education: Not on file  . Highest education level: Not  on file  Occupational History  . Occupation: Retired    Fish farm manager: RETIRED  Social Needs  . Financial resource strain: Not on file  . Food insecurity:    Worry: Not on file    Inability: Not on file  . Transportation needs:    Medical: Not on file    Non-medical: Not on file  Tobacco Use  . Smoking status: Former Smoker    Packs/day: 1.00    Years: 55.00    Pack years: 55.00    Types: Cigarettes    Last attempt to quit: 09/29/2006    Years since quitting: 11.4  . Smokeless tobacco: Never Used  Substance and Sexual Activity  . Alcohol use: No  . Drug use: No  . Sexual activity: Not on file  Lifestyle  . Physical activity:    Days per week: Not on file    Minutes per session: Not on file  . Stress: Not on file  Relationships  . Social connections:    Talks on phone: Not on file    Gets together: Not on file    Attends religious service: Not on file    Active member of club or organization: Not on file    Attends meetings of clubs or organizations: Not on file    Relationship status: Not on file  . Intimate partner violence:    Fear of current or ex partner: Not on file    Emotionally abused: Not on file    Physically abused: Not on file    Forced sexual activity: Not on file  Other Topics Concern  . Not on file  Social History Narrative   Lives with her son but he works all day. Home for dinner.  Independent ADL. Needs assist on some IADL.     Family History  Problem Relation Age of Onset  . Heart attack Mother 44  .  Cancer Father 47  . Heart attack Son     Review of Systems:  As stated in the HPI and otherwise negative.   BP 140/70   Ht 5\' 5"  (1.651 m)   Wt 184 lb (83.5 kg)   BMI 30.62 kg/m   Physical Examination:  General: Well developed, well nourished, NAD  HEENT: OP clear, mucus membranes moist  SKIN: warm, dry. No rashes. Neuro: No focal deficits  Musculoskeletal: Muscle strength 5/5 all ext  Psychiatric: Mood and affect normal  Neck: No JVD, no carotid bruits, no thyromegaly, no lymphadenopathy.  Lungs:Clear bilaterally, no wheezes, rhonci, crackles Cardiovascular: Regular rate and rhythm. No murmurs, gallops or rubs. Abdomen:Soft. Bowel sounds present. Non-tender.  Extremities: No lower extremity edema. Pulses are 2 + in the bilateral DP/PT.  EKG:  EKG is ordered today. The ekg ordered today demonstrates NSR, rate 68 bpm. Inferior Q waves.   Recent Labs: 11/17/2017: ALT 14; BUN 16; Creat 0.78; Hemoglobin 12.7; Platelets 193; Potassium 3.9; Sodium 140   Lipid Panel    Component Value Date/Time   CHOL 125 11/17/2017 1526   TRIG 368 (H) 11/17/2017 1526   TRIG 1693 (HH) 07/28/2006 1202   HDL 45 (L) 11/17/2017 1526   CHOLHDL 2.8 11/17/2017 1526   VLDL UNABLE TO CALCULATE IF TRIGLYCERIDE OVER 400 mg/dL 02/19/2014 0228   LDLCALC 41 11/17/2017 1526   LDLDIRECT 38 06/23/2017 1617     Wt Readings from Last 3 Encounters:  03/22/18 184 lb (83.5 kg)  11/17/17 182 lb (82.6 kg)  06/15/17 183 lb 6.4 oz (83.2 kg)  Other studies Reviewed: Additional studies/ records that were reviewed today include:. Review of the above records demonstrates:    Assessment and Plan:   1. CAD without angina: No chest pain. Will continue lifetime DAPT with ASA and Plavix for lifetime given the overlapping covered stent and drug eluting stents in the RCA. Continue statin and beta blocker.    2. HTN: BP is controlled. No changes.   3. Hyperlipidemia: Lipids are managed in primary care. Continue  statin.    4. Chronic diastolic CHF: Her volume status is ok. Continue Lasix.   Current medicines are reviewed at length with the patient today.  The patient does not have concerns regarding medicines.  The following changes have been made:  no change  Labs/ tests ordered today include:  No orders of the defined types were placed in this encounter.   Disposition:   FU with me in 12 months  Signed, Lauree Chandler, MD 03/22/2018 4:35 PM    [facility] [place], [place], [place]  [address] Phone: [phone]; Fax: [phone]

## 2018-03-22 NOTE — Patient Instructions (Signed)

## 2018-03-23 ENCOUNTER — Ambulatory Visit (INDEPENDENT_AMBULATORY_CARE_PROVIDER_SITE_OTHER): Payer: Medicare HMO | Admitting: Family Medicine

## 2018-03-23 ENCOUNTER — Encounter: Payer: Self-pay | Admitting: Family Medicine

## 2018-03-23 ENCOUNTER — Other Ambulatory Visit: Payer: Self-pay | Admitting: Cardiovascular Disease

## 2018-03-23 VITALS — BP 130/72 | HR 75 | Temp 98.7°F | Ht 65.0 in | Wt 189.0 lb

## 2018-03-23 DIAGNOSIS — I25118 Atherosclerotic heart disease of native coronary artery with other forms of angina pectoris: Secondary | ICD-10-CM | POA: Diagnosis not present

## 2018-03-23 DIAGNOSIS — M170 Bilateral primary osteoarthritis of knee: Secondary | ICD-10-CM | POA: Diagnosis not present

## 2018-03-23 DIAGNOSIS — I5032 Chronic diastolic (congestive) heart failure: Secondary | ICD-10-CM

## 2018-03-23 DIAGNOSIS — E119 Type 2 diabetes mellitus without complications: Secondary | ICD-10-CM

## 2018-03-23 DIAGNOSIS — Z794 Long term (current) use of insulin: Secondary | ICD-10-CM

## 2018-03-23 DIAGNOSIS — Z23 Encounter for immunization: Secondary | ICD-10-CM

## 2018-03-23 DIAGNOSIS — F411 Generalized anxiety disorder: Secondary | ICD-10-CM

## 2018-03-23 DIAGNOSIS — E785 Hyperlipidemia, unspecified: Secondary | ICD-10-CM

## 2018-03-23 DIAGNOSIS — L989 Disorder of the skin and subcutaneous tissue, unspecified: Secondary | ICD-10-CM | POA: Diagnosis not present

## 2018-03-23 DIAGNOSIS — J449 Chronic obstructive pulmonary disease, unspecified: Secondary | ICD-10-CM

## 2018-03-23 MED ORDER — TRAMADOL HCL 50 MG PO TABS: 50.0000 mg | ORAL_TABLET | Freq: Three times a day (TID) | ORAL | 3 refills | Status: DC | PRN

## 2018-03-23 NOTE — Assessment & Plan Note (Signed)
Her copd remains well controlled on Advair daily along with Spiriva.  She also uses albuterol as needed sparingly

## 2018-03-23 NOTE — Assessment & Plan Note (Signed)
For anxiety- she remains on low-dose Zoloft 25 mg

## 2018-03-23 NOTE — Assessment & Plan Note (Signed)
She uses tramadol for her knee pain once in AM and once at night, occasionally uses a 3rd dose. Injections have not helped, not interested in surgery

## 2018-03-23 NOTE — Assessment & Plan Note (Signed)
S: reasonably controlled on rosuvastatin 40mg  and fenofibrate  With LDL at least below 70 and triglycerides under 400 on last check Lab Results  Component Value Date   CHOL 125 11/17/2017   HDL 45 (L) 11/17/2017   LDLCALC 41 11/17/2017   LDLDIRECT 38 06/23/2017   TRIG 368 (H) 11/17/2017   CHOLHDL 2.8 11/17/2017   A/P: would prefer lower #s but these are actually pretty reasonable for her- weight loss would be great but her knees limit activity and she enjoys eating

## 2018-03-23 NOTE — Assessment & Plan Note (Signed)
Just had good cardiology report yesterday. CAD-given her cardiac history and stent set up.  she remains on Plavix, asa, Crestor, fenofibrate

## 2018-03-23 NOTE — Assessment & Plan Note (Signed)
S:Diabetes has been well controlled on Lantus 18 units in the morning with metformin 1 g in AM, 500mg  in PM due to diarrhea on higher doses.   CBGs range from 110-150sfasting.  A/P:We will update A1c.

## 2018-03-23 NOTE — Progress Notes (Signed)
Subjective:  Michelle [name] is a 84 y.o. year old very pleasant female patient who presents for/with See problem oriented charting ROS- baseline SOB. No increased edema. Does have worsening skin lesion on right lower leg. No chest pain.    Past Medical History-  Patient Active Problem List   Diagnosis Date Noted  . Chronic diastolic CHF (congestive heart failure) ([place]) 03/13/2014    Priority: High  . CAD- RCA PCI '90s, STEMI-RCA DES 02/18/14 03/05/2014    Priority: High  . IDDM type 2 03/26/2007    Priority: High  . Anemia, iron deficiency 10/23/2015    Priority: Medium  . Osteoarthritis, knee 05/13/2014    Priority: Medium  . Anxiety state 04/21/2014    Priority: Medium  . SOB (shortness of breath) 03/05/2014    Priority: Medium  . Hyperlipidemia with target LDL less than 70 03/26/2007    Priority: Medium  . Essential hypertension 04/21/2006    Priority: Medium  . COPD (chronic obstructive pulmonary disease) ([place]) 04/21/2006    Priority: Medium  . Actinic keratosis 11/17/2017    Priority: Low  . CKD (chronic kidney disease), stage II 07/25/2014    Priority: Low  . Back pain, lumbosacral 10/10/2012    Priority: Low  . Chest pain 12/14/2014    Medications- reviewed and updated Current Outpatient Medications  Medication Sig Dispense Refill  . ACCU-CHEK FASTCLIX LANCETS MISC     . ACCU-CHEK SMARTVIEW test strip TEST THREE TIMES DAILY 300 each 1  . acetaminophen (TYLENOL) 325 MG tablet Take 650 mg by mouth every 6 (six) hours as needed for mild pain. Reported on 10/23/2015    . ADVAIR DISKUS 250-50 MCG/DOSE AEPB INHALE 1 PUFF INTO THE LUNGS DAILY AS NEEDED. 60 each 2  . albuterol (PROVENTIL HFA;VENTOLIN HFA) 108 (90 Base) MCG/ACT inhaler Inhale 1 puff into the lungs every 4 (four) hours as needed for wheezing or shortness of breath. 1 Inhaler 0  . albuterol (PROVENTIL) (2.5 MG/3ML) 0.083% nebulizer solution TAKE 3 MLS (2.5 MG TOTAL) BY NEBULIZATION EVERY 6 (SIX) HOURS AS NEEDED  FOR WHEEZING OR SHORTNESS OF BREATH 75 mL 5  . Alcohol Swabs (B-D SINGLE USE SWABS REGULAR) PADS     . amLODipine (NORVASC) 10 MG tablet TAKE 1 TABLET EVERY DAY 90 tablet 3  . aspirin 81 MG tablet Take 81 mg by mouth daily.     . B-D ULTRAFINE III SHORT PEN 31G X 8 MM MISC USE AS DIRECTED WITH LANTUS SOLOSTAR 90 each 10  . Blood Glucose Calibration (ACCU-CHEK SMARTVIEW CONTROL) LIQD     . carvedilol (COREG) 12.5 MG tablet TAKE 1 TABLET BY MOUTH TWICE DAILY WITH A MEAL 180 tablet 4  . clopidogrel (PLAVIX) 75 MG tablet Take 1 tablet (75 mg total) by mouth daily. 90 tablet 1  . enalapril (VASOTEC) 20 MG tablet TAKE 1 TABLET TWICE DAILY 180 tablet 3  . fenofibrate micronized (LOFIBRA) 134 MG capsule TAKE 1 CAPSULE EVERY DAY 90 capsule 3  . ferrous sulfate 325 (65 FE) MG tablet Take 1 tablet (325 mg total) by mouth 2 (two) times daily with a meal. 60 tablet 3  . fish oil-omega-3 fatty acids 1000 MG capsule Take 2 g by mouth daily.      . folic acid (FOLVITE) 222 MCG tablet Take 400 mcg by mouth daily.      . furosemide (LASIX) 40 MG tablet TAKE 1 TABLET EVERY DAY 90 tablet 1  . LANTUS SOLOSTAR 100 UNIT/ML Solostar Pen INJECT  17  TO  20 UNITS SUBCUTANEOUSLY EVERY MORNING 30 mL 10  . Melatonin 5 MG CAPS Take 1 capsule by mouth at bedtime.     . metFORMIN (GLUCOPHAGE) 1000 MG tablet TAKE 1 TABLET TWICE DAILY 180 tablet 3  . nitroGLYCERIN (NITROSTAT) 0.4 MG SL tablet PLACE 1 TABLET (0.4 MG TOTAL) UNDER THE TONGUE EVERY 5 (FIVE) MINUTES AS NEEDED FOR CHEST PAIN (CHEST PAIN OR SHORTNESS OF BREATH). 25 tablet 4  . pantoprazole (PROTONIX) 40 MG tablet TAKE 1 TABLET (40 MG TOTAL) BY MOUTH DAILY. 90 tablet 3  . Potassium Chloride ER 20 MEQ TBCR TAKE ONE TABLET BY MOUTH DAILY 30 tablet 10  . potassium chloride SA (K-DUR,KLOR-CON) 20 MEQ tablet TAKE ONE TABLET BY MOUTH DAILY 90 tablet 3  . rosuvastatin (CRESTOR) 40 MG tablet TAKE 1 TABLET EVERY DAY 90 tablet 2  . sertraline (ZOLOFT) 25 MG tablet TAKE 1 TABLET  EVERY DAY 90 tablet 3  . sodium chloride (OCEAN) 0.65 % SOLN nasal spray Place 1 spray into both nostrils as needed for congestion.    Marland Kitchen SPIRIVA HANDIHALER 18 MCG inhalation capsule PLACE 1 CAPSULE INTO HANDIHALER AND INHALE AS DIRECTED DAILY AT 2:00PM 90 capsule 6  . traMADol (ULTRAM) 50 MG tablet Take 1 tablet (50 mg total) by mouth 3 (three) times daily as needed. One refill per month 70 tablet 3  . triamcinolone cream (KENALOG) 0.1 % Apply 1 application topically 2 (two) times daily. 30 g 0  . vitamin E 400 UNIT capsule Take 400 Units by mouth daily.       No current facility-administered medications for this visit.     Objective: BP 130/72 (BP Location: Left Arm, Patient Position: Sitting, Cuff Size: Large)   Pulse 75   Temp 98.7 F (37.1 C) (Oral)   Ht 5\' 5"  (1.651 m)   Wt 189 lb (85.7 kg)   SpO2 93%   BMI 31.45 kg/m  Gen: NAD, resting comfortably CV: RRR no murmurs rubs or gallops Lungs: CTAB no crackles, wheeze, rhonchi Abdomen: soft/nontender/nondistended/normal bowel sounds. No rebound or guarding.  Ext: trace edema Skin: warm, dry, horned lesion over raised erythematous base on right lower leg (refer to derm)  Assessment/Plan:  Hyperlipidemia with target LDL less than 70 S: reasonably controlled on rosuvastatin 40mg  and fenofibrate  With LDL at least below 70 and triglycerides under 400 on last check Lab Results  Component Value Date   CHOL 125 11/17/2017   HDL 45 (L) 11/17/2017   LDLCALC 41 11/17/2017   LDLDIRECT 38 06/23/2017   TRIG 368 (H) 11/17/2017   CHOLHDL 2.8 11/17/2017   A/P: would prefer lower #s but these are actually pretty reasonable for her- weight loss would be great but her knees limit activity and she enjoys eating     Chronic diastolic CHF (congestive heart failure) S: weight stable aroudn 184 at home. Was weighed in her chair today leading to false reading. Edema stable. Compliant with lasix 40mg - she is also on potassium A/P: continue  current rx- doing well   IDDM type 2 S:Diabetes has been well controlled on Lantus 18 units in the morning with metformin 1 g in AM, 500mg  in PM due to diarrhea on higher doses.   CBGs range from 110-150sfasting.  A/P:We will update A1c.   COPD (chronic obstructive pulmonary disease) ([place]) Her copd remains well controlled on Advair daily along with Spiriva.  She also uses albuterol as needed sparingly  Osteoarthritis, knee She uses tramadol for her  knee pain once in AM and once at night, occasionally uses a 3rd dose. Injections have not helped, not interested in surgery  Anxiety state For anxiety- she remains on low-dose Zoloft 25 mg  CAD- RCA PCI '90s, STEMI-RCA DES 02/18/14 Just had good cardiology report yesterday. CAD-given her cardiac history and stent set up.  she remains on Plavix, asa, Crestor, fenofibrate  Future Appointments  Date Time Provider [place]  04/17/2018  1:00 PM [facility] COACH [facility]  07/27/2018  3:30 PM [name], MD [facility]   Return in about 4 months (around 07/23/2018) for follow up- or sooner if needed.  Lab/Order associations: Need for prophylactic vaccination and inoculation against influenza - Plan: Flu vaccine HIGH DOSE PF  IDDM type 2 - Plan: Comprehensive metabolic panel, Hemoglobin A1c, Hemoglobin A1c, Comprehensive metabolic panel, CANCELED: Hemoglobin A1c, CANCELED: Comprehensive metabolic panel  Skin lesion of right leg - Plan: Ambulatory referral to Dermatology [facility] lesion- concerning for cancer  Hyperlipidemia with target LDL less than 70  Chronic diastolic CHF (congestive heart failure) ([facility])  Chronic obstructive pulmonary disease, unspecified COPD type ([place])  Primary osteoarthritis of both knees  Anxiety state  Coronary artery disease involving native coronary artery of native heart with other form of angina pectoris ([place])  Meds ordered this encounter  Medications  . traMADol (ULTRAM) 50 MG tablet     Sig: Take 1 tablet (50 mg total) by mouth 3 (three) times daily as needed. One refill per month    Dispense:  70 tablet    Refill:  3    Return precautions advised.  [name], MD

## 2018-03-23 NOTE — Assessment & Plan Note (Signed)
S: weight stable aroudn 184 at home. Was weighed in her chair today leading to false reading. Edema stable. Compliant with lasix 40mg - she is also on potassium A/P: continue current rx- doing well

## 2018-03-23 NOTE — Patient Instructions (Signed)
Please stop by lab before you go  We will call you within two weeks about your referral to dermatology. If you do not hear within 3 weeks, give [place] a call.

## 2018-03-24 LAB — COMPREHENSIVE METABOLIC PANEL
AG RATIO: 1.4 (calc) (ref 1.0–2.5)
ALKALINE PHOSPHATASE (APISO): 61 U/L (ref 33–130)
ALT: 14 U/L (ref 6–29)
AST: 16 U/L (ref 10–35)
Albumin: 4.3 g/dL (ref 3.6–5.1)
BILIRUBIN TOTAL: 0.3 mg/dL (ref 0.2–1.2)
BUN/Creatinine Ratio: 26 (calc) — ABNORMAL HIGH (ref 6–22)
BUN: 24 mg/dL (ref 7–25)
CALCIUM: 10.3 mg/dL (ref 8.6–10.4)
CO2: 26 mmol/L (ref 20–32)
Chloride: 102 mmol/L (ref 98–110)
Creat: 0.93 mg/dL — ABNORMAL HIGH (ref 0.60–0.88)
Globulin: 3 g/dL (calc) (ref 1.9–3.7)
Glucose, Bld: 126 mg/dL — ABNORMAL HIGH (ref 65–99)
Potassium: 4.2 mmol/L (ref 3.5–5.3)
Sodium: 140 mmol/L (ref 135–146)
Total Protein: 7.3 g/dL (ref 6.1–8.1)

## 2018-03-24 LAB — HEMOGLOBIN A1C
EAG (MMOL/L): 8.5 (calc)
HEMOGLOBIN A1C: 7 %{Hb} — AB (ref <5.7)
MEAN PLASMA GLUCOSE: 154 (calc)

## 2018-03-27 NOTE — Addendum Note (Signed)
Addended by: Maren Beach, Chaselyn Nanney A on: 03/27/2018 09:21 AM   Modules accepted: Orders

## 2018-04-02 ENCOUNTER — Telehealth: Payer: Self-pay | Admitting: Family Medicine

## 2018-04-02 ENCOUNTER — Other Ambulatory Visit: Payer: Self-pay | Admitting: Family Medicine

## 2018-04-02 NOTE — Telephone Encounter (Signed)
Copied from [place] [id number]. Topic: Quick Communication - See Telephone Encounter >> Apr 02, 2018  5:44 PM Ivar Drape wrote: CRM for notification. See Telephone encounter for: 04/02/18. Patient needs about a 3 week supply of her SPIRIVA HANDIHALER 18 MCG medication until her mail order pharmacy delivers it. She would like that sent to the [facility] on [address]. Patient only has two pills left.

## 2018-04-03 NOTE — Telephone Encounter (Signed)
Prescription was sent to [facility] this morning

## 2018-04-05 DIAGNOSIS — C44722 Squamous cell carcinoma of skin of right lower limb, including hip: Secondary | ICD-10-CM | POA: Diagnosis not present

## 2018-04-05 DIAGNOSIS — D485 Neoplasm of uncertain behavior of skin: Secondary | ICD-10-CM | POA: Diagnosis not present

## 2018-04-13 DIAGNOSIS — L239 Allergic contact dermatitis, unspecified cause: Secondary | ICD-10-CM | POA: Diagnosis not present

## 2018-04-13 DIAGNOSIS — L080 Pyoderma: Secondary | ICD-10-CM | POA: Diagnosis not present

## 2018-04-13 DIAGNOSIS — C44722 Squamous cell carcinoma of skin of right lower limb, including hip: Secondary | ICD-10-CM | POA: Diagnosis not present

## 2018-04-16 ENCOUNTER — Ambulatory Visit: Payer: Medicare HMO

## 2018-04-17 ENCOUNTER — Other Ambulatory Visit: Payer: Self-pay | Admitting: Family Medicine

## 2018-04-17 ENCOUNTER — Ambulatory Visit: Payer: Medicare HMO

## 2018-04-18 ENCOUNTER — Other Ambulatory Visit: Payer: Self-pay | Admitting: Family Medicine

## 2018-04-27 ENCOUNTER — Other Ambulatory Visit: Payer: Self-pay | Admitting: Family Medicine

## 2018-05-16 ENCOUNTER — Ambulatory Visit (INDEPENDENT_AMBULATORY_CARE_PROVIDER_SITE_OTHER): Payer: Medicare HMO

## 2018-05-16 VITALS — BP 144/68 | HR 71 | Temp 98.7°F

## 2018-05-16 DIAGNOSIS — Z0000 Encounter for general adult medical examination without abnormal findings: Secondary | ICD-10-CM

## 2018-05-16 NOTE — Patient Instructions (Signed)
Ms. Colantuono , Thank you for taking time to come for your Medicare Wellness Visit. I appreciate your ongoing commitment to your health goals. Please review the following plan we discussed and let me know if I can assist you in the future.   These are the goals we discussed: Goals    . Exercise 150 minutes per week (moderate activity)     Exercise in the chair Get up from the chair 3 times ; 3 times a day Walk in the home a couple times a day        This is a list of the screening recommended for you and due dates:  Health Maintenance  Topic Date Due  . Eye exam for diabetics  04/28/2018  . DEXA scan (bone density measurement)  04/21/2043*  . Hemoglobin A1C  09/21/2018  . Complete foot exam   11/18/2018  . Tetanus Vaccine  07/28/2021  . Flu Shot  Completed  . Pneumonia vaccines  Completed  *Topic was postponed. The date shown is not the original due date.    Preventive Care for Adults  A healthy lifestyle and preventive care can promote health and wellness. Preventive health guidelines for adults include the following key practices.  . A routine yearly physical is a good way to check with your health care provider about your health and preventive screening. It is a chance to share any concerns and updates on your health and to receive a thorough exam.  . Visit your dentist for a routine exam and preventive care every 6 months. Brush your teeth twice a day and floss once a day. Good oral hygiene prevents tooth decay and gum disease.  . The frequency of eye exams is based on your age, health, family medical history, use  of contact lenses, and other factors. Follow your health care provider's recommendations for frequency of eye exams.  . Eat a healthy diet. Foods like vegetables, fruits, whole grains, low-fat dairy products, and lean protein foods contain the nutrients you need without too many calories. Decrease your intake of foods high in solid fats, added sugars, and salt. Eat  the right amount of calories for you. Get information about a proper diet from your health care provider, if necessary.  . Regular physical exercise is one of the most important things you can do for your health. Most adults should get at least 150 minutes of moderate-intensity exercise (any activity that increases your heart rate and causes you to sweat) each week. In addition, most adults need muscle-strengthening exercises on 2 or more days a week.  Silver Sneakers may be a benefit available to you. To determine eligibility, you may visit the website: www.silversneakers.com or contact program at 316-593-4375 Mon-Fri between 8AM-8PM.   . Maintain a healthy weight. The body mass index (BMI) is a screening tool to identify possible weight problems. It provides an estimate of body fat based on height and weight. Your health care provider can find your BMI and can help you achieve or maintain a healthy weight.   For adults 20 years and older: ? A BMI below 18.5 is considered underweight. ? A BMI of 18.5 to 24.9 is normal. ? A BMI of 25 to 29.9 is considered overweight. ? A BMI of 30 and above is considered obese.   . Maintain normal blood lipids and cholesterol levels by exercising and minimizing your intake of saturated fat. Eat a balanced diet with plenty of fruit and vegetables. Blood tests for lipids and cholesterol  should begin at age 90 and be repeated every 5 years. If your lipid or cholesterol levels are high, you are over 50, or you are at high risk for heart disease, you may need your cholesterol levels checked more frequently. Ongoing high lipid and cholesterol levels should be treated with medicines if diet and exercise are not working.  . If you smoke, find out from your health care provider how to quit. If you do not use tobacco, please do not start.  . If you choose to drink alcohol, please do not consume more than 2 drinks per day. One drink is considered to be 12 ounces (355 mL)  of beer, 5 ounces (148 mL) of wine, or 1.5 ounces (44 mL) of liquor.  . If you are 72-66 years old, ask your health care provider if you should take aspirin to prevent strokes.  . Use sunscreen. Apply sunscreen liberally and repeatedly throughout the day. You should seek shade when your shadow is shorter than you. Protect yourself by wearing long sleeves, pants, a wide-brimmed hat, and sunglasses year round, whenever you are outdoors.  . Once a month, do a whole body skin exam, using a mirror to look at the skin on your back. Tell your health care provider of new moles, moles that have irregular borders, moles that are larger than a pencil eraser, or moles that have changed in shape or color.

## 2018-05-16 NOTE — Progress Notes (Signed)
PCP notes: Last OV 03/23/2018   Health maintenance: Eye Exam- Call and schedule an eye exam   Abnormal screenings: None   Patient concerns: Cough. Has been going on for about 3 weeks. Nonproductive. Will schedule an appointment with Dr Yong Channel. Scheduled for 05/18/2018   Nurse concerns: None   Next PCP appt: 07/27/2018

## 2018-05-16 NOTE — Progress Notes (Signed)
Subjective:   Michelle [name] is a 84 y.o. female who presents for Medicare Annual (Subsequent) preventive examination.  Review of Systems:  No ROS.  Medicare Wellness Visit. Additional risk factors are reflected in the social history. Cardiac Risk Factors include: advanced age (>76men, >44 women) Patient lives with her son [name]. Single story home with a basement.She has a boxer named Dexter. Patient enjoys watching tv, working word search puzzles, and enjoys watching the [employer].  Patient goes to bed around 9-10pm. Patient gets up about 2-3 times a night to go to the bathroom. Patient gets up around 10am. Patient feels rested when she wakes up.    Objective:     Vitals: BP (!) 144/68 (BP Location: Left Arm, Patient Position: Sitting, Cuff Size: Large)   Pulse 71   Temp 98.7 F (37.1 C) (Oral)   SpO2 95%   There is no height or weight on file to calculate BMI.  Advanced Directives 05/16/2018 04/14/2017 12/14/2014 07/13/2014 03/14/2014 03/14/2014 03/05/2014  Does Patient Have a Medical Advance Directive? No No No No - No No  Would patient like information on creating a medical advance directive? No - Patient declined (No Data) No - patient declined information No - patient declined information No - patient declined information No - patient declined information Yes - Educational materials given  Pre-existing out of facility DNR order (yellow form or pink MOST form) - - - - - - -    Tobacco Social History   Tobacco Use  Smoking Status Former Smoker  . Packs/day: 1.00  . Years: 55.00  . Pack years: 55.00  . Types: Cigarettes  . Last attempt to quit: 09/29/2006  . Years since quitting: 11.6  Smokeless Tobacco Never Used     Counseling given: Not Answered    Past Medical History:  Diagnosis Date  . Anginal pain ([place])   . Arthritis   . AV block, 1st degree   . CAD (coronary artery disease) 1990; 2015   Cardiac cath 1990 with Dr. Lia Foyer and pt reports blockage in  artery  with angioplasty. She has pictures that show severe stenosis mid RCA and a post PTCA picture with 30% residual stenosis post PTCA. Residual CAD, non obstructive per 2015 cath. STEMI status post stent in August 2015.  Marland Kitchen COPD (chronic obstructive pulmonary disease) ([place])   . Diabetes mellitus type 2, insulin dependent ([place])   . Essential hypertension   . Hyperlipidemia with target LDL less than 70   . Myocardial infarction ([place])   . Pericarditis-post MI (short course of steroids) 03/06/2014  . 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 Shortness of breath    Past Surgical History:  Procedure Laterality Date  . CATARACT EXTRACTION  2010  . CHOLECYSTECTOMY    . CORONARY ANGIOPLASTY WITH STENT PLACEMENT  02/18/14   Promus DES to RCA  . LEFT HEART CATHETERIZATION WITH CORONARY ANGIOGRAM N/A 02/18/2014   Procedure: LEFT HEART CATHETERIZATION WITH CORONARY ANGIOGRAM;  Surgeon: Jettie Booze, MD;  Location: [facility] CATH LAB;  Service: Cardiovascular;  Laterality: N/A;  . PTCA  1990   PTCA of RCA  . TRANSTHORACIC ECHOCARDIOGRAM  02/02/2012   mild LVH, EF 55-60%, Normal WM, Gr 1 DD; Mild MR   Family History  Problem Relation Age of Onset  . Heart attack Mother 69  . Cancer Father 7  . Cancer Maternal Grandfather   .  Cancer Paternal Grandmother   . Cancer Paternal Grandfather   . Diabetes Son   . Liver cancer Brother   . Bone cancer Brother   . Heart attack Son   . Hypertension Son   . Diabetes Son   . Hyperlipidemia Son    Social History   Socioeconomic History  . Marital status: Married    Spouse name: Not on file  . Number of children: 2  . Years of education: Not on file  . Highest education level: Not on file  Occupational History  . Occupation: Retired    Fish farm manager: RETIRED  Social Needs  . Financial resource strain: Not on file  . Food insecurity:    Worry: Not on file    Inability: Not on  file  . Transportation needs:    Medical: Not on file    Non-medical: Not on file  Tobacco Use  . Smoking status: Former Smoker    Packs/day: 1.00    Years: 55.00    Pack years: 55.00    Types: Cigarettes    Last attempt to quit: 09/29/2006    Years since quitting: 11.6  . Smokeless tobacco: Never Used  Substance and Sexual Activity  . Alcohol use: No  . Drug use: No  . Sexual activity: Not Currently  Lifestyle  . Physical activity:    Days per week: Not on file    Minutes per session: Not on file  . Stress: Not on file  Relationships  . Social connections:    Talks on phone: Not on file    Gets together: Not on file    Attends religious service: Not on file    Active member of club or organization: Not on file    Attends meetings of clubs or organizations: Not on file    Relationship status: Not on file  Other Topics Concern  . Not on file  Social History Narrative   Lives with her son but he works all day. Home for dinner.  Independent ADL. Needs assist on some IADL.     Outpatient Encounter Medications as of 05/16/2018  Medication Sig  . ACCU-CHEK FASTCLIX LANCETS MISC   . ACCU-CHEK SMARTVIEW test strip TEST THREE TIMES DAILY  . acetaminophen (TYLENOL) 325 MG tablet Take 650 mg by mouth every 6 (six) hours as needed for mild pain. Reported on 10/23/2015  . albuterol (PROVENTIL HFA;VENTOLIN HFA) 108 (90 Base) MCG/ACT inhaler Inhale 1 puff into the lungs every 4 (four) hours as needed for wheezing or shortness of breath.  Marland Kitchen albuterol (PROVENTIL) (2.5 MG/3ML) 0.083% nebulizer solution TAKE 3 MLS (2.5 MG TOTAL) BY NEBULIZATION EVERY 6 (SIX) HOURS AS NEEDED FOR WHEEZING OR SHORTNESS OF BREATH  . Alcohol Swabs (B-D SINGLE USE SWABS REGULAR) PADS USE  FOR  TESTING THREE TIMES DAILY  . amLODipine (NORVASC) 10 MG tablet TAKE 1 TABLET EVERY DAY  . aspirin 81 MG tablet Take 81 mg by mouth daily.   . B-D ULTRAFINE III SHORT PEN 31G X 8 MM MISC USE AS DIRECTED WITH LANTUS SOLOSTAR    . Blood Glucose Calibration (ACCU-CHEK SMARTVIEW CONTROL) LIQD   . carvedilol (COREG) 12.5 MG tablet TAKE 1 TABLET BY MOUTH TWICE DAILY WITH A MEAL  . clopidogrel (PLAVIX) 75 MG tablet Take 1 tablet (75 mg total) by mouth daily.  . enalapril (VASOTEC) 20 MG tablet TAKE 1 TABLET TWICE DAILY  . fenofibrate micronized (LOFIBRA) 134 MG capsule TAKE ONE CAPSULE BY MOUTH DAILY  . ferrous sulfate  325 (65 FE) MG tablet Take 1 tablet (325 mg total) by mouth 2 (two) times daily with a meal.  . fish oil-omega-3 fatty acids 1000 MG capsule Take 2 g by mouth daily.    . Fluticasone-Salmeterol (ADVAIR) 250-50 MCG/DOSE AEPB INHALE 1 PUFF INTO THE LUNGS DAILY AS NEEDED  . folic acid (FOLVITE) 811 MCG tablet Take 400 mcg by mouth daily.    . furosemide (LASIX) 40 MG tablet TAKE 1 TABLET (40 MG TOTAL) BY MOUTH DAILY.  Marland Kitchen LANTUS SOLOSTAR 100 UNIT/ML Solostar Pen INJECT  17  TO  20 UNITS SUBCUTANEOUSLY EVERY MORNING  . Melatonin 5 MG CAPS Take 1 capsule by mouth at bedtime.   . metFORMIN (GLUCOPHAGE) 1000 MG tablet TAKE 1 TABLET TWICE DAILY  . nitroGLYCERIN (NITROSTAT) 0.4 MG SL tablet PLACE 1 TABLET (0.4 MG TOTAL) UNDER THE TONGUE EVERY 5 (FIVE) MINUTES AS NEEDED FOR CHEST PAIN (CHEST PAIN OR SHORTNESS OF BREATH).  Marland Kitchen pantoprazole (PROTONIX) 40 MG tablet TAKE 1 TABLET (40 MG TOTAL) BY MOUTH DAILY.  Marland Kitchen Potassium Chloride ER 20 MEQ TBCR TAKE ONE TABLET BY MOUTH DAILY  . potassium chloride SA (K-DUR,KLOR-CON) 20 MEQ tablet TAKE ONE TABLET BY MOUTH DAILY  . rosuvastatin (CRESTOR) 40 MG tablet TAKE 1 TABLET EVERY DAY  . sertraline (ZOLOFT) 25 MG tablet TAKE 1 TABLET EVERY DAY  . sodium chloride (OCEAN) 0.65 % SOLN nasal spray Place 1 spray into both nostrils as needed for congestion.  Marland Kitchen SPIRIVA HANDIHALER 18 MCG inhalation capsule PLACE 1 CAPSULE INTO HANDIHALER AND INHALE AS DIRECTED DAILY AT 2:00PM  . traMADol (ULTRAM) 50 MG tablet Take 1 tablet (50 mg total) by mouth 3 (three) times daily as needed. One refill per  month  . triamcinolone cream (KENALOG) 0.1 % Apply 1 application topically 2 (two) times daily.  . vitamin E 400 UNIT capsule Take 400 Units by mouth daily.    . [DISCONTINUED] SPIRIVA HANDIHALER 18 MCG inhalation capsule PLACE ONE CAPSULE INTO INHALER AND INHALE  BY MOUTH DAILY AT 2 PM   No facility-administered encounter medications on file as of 05/16/2018.     Activities of Daily Living In your present state of health, do you have any difficulty performing the following activities: 05/16/2018  Hearing? N  Vision? N  Difficulty concentrating or making decisions? N  Walking or climbing stairs? Y  Dressing or bathing? N  Doing errands, shopping? Y  Comment Patient doesn't drive  Preparing Food and eating ? N  Using the Toilet? N  In the past six months, have you accidently leaked urine? N  Do you have problems with loss of bowel control? N  Managing your Medications? N  Managing your Finances? N  Housekeeping or managing your Housekeeping? Y  Comment Lives with son who helps take care of her  Some recent data might be hidden    Patient Care Team: [name], MD as PCP - General (Family Medicine) Burnell Blanks, MD as PCP - Cardiology (Cardiology)    Assessment:   This is a routine wellness examination for Jasmia.  Exercise Activities and Dietary recommendations Current Exercise Habits: The patient does not participate in regular exercise at present, Exercise limited by: cardiac condition(s)  Breakfast: A piece of liver pudding and scrambled eggs, potato cake, pancake or waffle, Black coffee  Lunch: Left overs with Unsweet tea  Dinner: Broccoli casserole, yellow squash, baked chicken, unsweet tea Goals    . Exercise 150 minutes per week (moderate activity)  Exercise in the chair Get up from the chair 3 times ; 3 times a day Walk in the home a couple times a day        Fall Risk Fall Risk  05/16/2018 04/14/2017 07/12/2016 06/02/2016 05/09/2016    Falls in the past year? Yes No Yes Yes Yes  Number falls in past yr: 2 or more - - - -  Injury with Fall? No - - - -  Risk for fall due to : Impaired balance/gait;Impaired mobility - - - -  Follow up - - - Falls prevention discussed Falls prevention discussed     Depression Screen PHQ 2/9 Scores 05/16/2018 11/17/2017 04/14/2017 07/12/2016  PHQ - 2 Score 0 0 0 0  PHQ- 9 Score - 4 - -     Cognitive Function MMSE - Mini Mental State Exam 04/14/2017  Not completed: (No Data)     6CIT Screen 05/16/2018  What Year? 0 points  What month? 0 points  What time? 0 points  Count back from 20 0 points  Months in reverse 0 points  Repeat phrase 0 points  Total Score 0    Immunization History  Administered Date(s) Administered  . Influenza Split 04/21/2011, 05/16/2012  . Influenza Whole 05/24/2007, 05/01/2009, 04/17/2010  . Influenza, High Dose Seasonal PF 03/23/2018  . Influenza,inj,Quad PF,6+ Mos 04/14/2013, 04/10/2014, 04/03/2015  . Influenza-Unspecified 06/01/2016, 06/15/2017  . Pneumococcal Conjugate-13 09/22/2014  . Pneumococcal Polysaccharide-23 04/18/2003  . Tdap 07/29/2011  . Zoster 04/18/2011      Screening Tests Health Maintenance  Topic Date Due  . OPHTHALMOLOGY EXAM  04/28/2018  . DEXA SCAN  04/21/2043 (Originally 12/31/1999)  . HEMOGLOBIN A1C  09/21/2018  . FOOT EXAM  11/18/2018  . TETANUS/TDAP  07/28/2021  . INFLUENZA VACCINE  Completed  . PNA vac Low Risk Adult  Completed        Plan:  Follow Up with PCP as advised   I have personally reviewed and noted the following in the patient's chart:   . Medical and social history . Use of alcohol, tobacco or illicit drugs  . Current medications and supplements . Functional ability and status . Nutritional status . Physical activity . Advanced directives . List of other physicians . Vitals . Screenings to include cognitive, depression, and falls . Referrals and appointments  In addition, I have reviewed  and discussed with patient certain preventive protocols, quality metrics, and best practice recommendations. A written personalized care plan for preventive services as well as general preventive health recommendations were provided to patient.     [place], LPN  01/11/9484

## 2018-05-16 NOTE — Progress Notes (Signed)
I have reviewed documentation for AWV and Advance Care planning provided by [facility], I agree with documentation, I was immediately available for any questions. Deztiny Sarra, DO   

## 2018-05-18 ENCOUNTER — Ambulatory Visit (INDEPENDENT_AMBULATORY_CARE_PROVIDER_SITE_OTHER): Payer: Medicare HMO | Admitting: Family Medicine

## 2018-05-18 ENCOUNTER — Encounter: Payer: Self-pay | Admitting: Family Medicine

## 2018-05-18 VITALS — BP 124/78 | HR 74 | Temp 98.0°F | Ht 65.0 in | Wt 181.6 lb

## 2018-05-18 DIAGNOSIS — E119 Type 2 diabetes mellitus without complications: Secondary | ICD-10-CM | POA: Diagnosis not present

## 2018-05-18 DIAGNOSIS — J441 Chronic obstructive pulmonary disease with (acute) exacerbation: Secondary | ICD-10-CM | POA: Diagnosis not present

## 2018-05-18 DIAGNOSIS — Z794 Long term (current) use of insulin: Secondary | ICD-10-CM | POA: Diagnosis not present

## 2018-05-18 MED ORDER — DOXYCYCLINE HYCLATE 100 MG PO CAPS: 100.0000 mg | ORAL_CAPSULE | Freq: Two times a day (BID) | ORAL | 0 refills | Status: DC

## 2018-05-18 MED ORDER — PREDNISONE 20 MG PO TABS: ORAL_TABLET | ORAL | 0 refills | Status: DC

## 2018-05-18 NOTE — Progress Notes (Signed)
Subjective:  Michelle [name] is a 84 y.o. year old very pleasant female patient who presents for/with See problem oriented charting ROS-  No fever or chills. No nausea or vomiting. No chest pain.   Past Medical History-  Patient Active Problem List   Diagnosis Date Noted  . Chronic diastolic CHF (congestive heart failure) ([place]) 03/13/2014    Priority: High  . CAD- RCA PCI '90s, STEMI-RCA DES 02/18/14 03/05/2014    Priority: High  . IDDM type 2 03/26/2007    Priority: High  . Anemia, iron deficiency 10/23/2015    Priority: Medium  . Osteoarthritis, knee 05/13/2014    Priority: Medium  . Anxiety state 04/21/2014    Priority: Medium  . SOB (shortness of breath) 03/05/2014    Priority: Medium  . Hyperlipidemia with target LDL less than 70 03/26/2007    Priority: Medium  . Essential hypertension 04/21/2006    Priority: Medium  . COPD (chronic obstructive pulmonary disease) ([place]) 04/21/2006    Priority: Medium  . Actinic keratosis 11/17/2017    Priority: Low  . CKD (chronic kidney disease), stage II 07/25/2014    Priority: Low  . Back pain, lumbosacral 10/10/2012    Priority: Low  . Chest pain 12/14/2014    Medications- reviewed and updated Current Outpatient Medications  Medication Sig Dispense Refill  . ACCU-CHEK FASTCLIX LANCETS MISC     . ACCU-CHEK SMARTVIEW test strip TEST THREE TIMES DAILY 300 each 1  . acetaminophen (TYLENOL) 325 MG tablet Take 650 mg by mouth every 6 (six) hours as needed for mild pain. Reported on 10/23/2015    . albuterol (PROVENTIL HFA;VENTOLIN HFA) 108 (90 Base) MCG/ACT inhaler Inhale 1 puff into the lungs every 4 (four) hours as needed for wheezing or shortness of breath. 1 Inhaler 0  . albuterol (PROVENTIL) (2.5 MG/3ML) 0.083% nebulizer solution TAKE 3 MLS (2.5 MG TOTAL) BY NEBULIZATION EVERY 6 (SIX) HOURS AS NEEDED FOR WHEEZING OR SHORTNESS OF BREATH 75 mL 5  . Alcohol Swabs (B-D SINGLE USE SWABS REGULAR) PADS USE  FOR  TESTING THREE TIMES DAILY 300  each 1  . amLODipine (NORVASC) 10 MG tablet TAKE 1 TABLET EVERY DAY 90 tablet 3  . aspirin 81 MG tablet Take 81 mg by mouth daily.     . B-D ULTRAFINE III SHORT PEN 31G X 8 MM MISC USE AS DIRECTED WITH LANTUS SOLOSTAR 90 each 10  . Blood Glucose Calibration (ACCU-CHEK SMARTVIEW CONTROL) LIQD     . carvedilol (COREG) 12.5 MG tablet TAKE 1 TABLET BY MOUTH TWICE DAILY WITH A MEAL 180 tablet 4  . clopidogrel (PLAVIX) 75 MG tablet Take 1 tablet (75 mg total) by mouth daily. 90 tablet 1  . enalapril (VASOTEC) 20 MG tablet TAKE 1 TABLET TWICE DAILY 180 tablet 3  . fenofibrate micronized (LOFIBRA) 134 MG capsule TAKE ONE CAPSULE BY MOUTH DAILY 90 capsule 2  . ferrous sulfate 325 (65 FE) MG tablet Take 1 tablet (325 mg total) by mouth 2 (two) times daily with a meal. 60 tablet 3  . fish oil-omega-3 fatty acids 1000 MG capsule Take 2 g by mouth daily.      . Fluticasone-Salmeterol (ADVAIR) 250-50 MCG/DOSE AEPB INHALE 1 PUFF INTO THE LUNGS DAILY AS NEEDED 60 each 1  . folic acid (FOLVITE) 831 MCG tablet Take 400 mcg by mouth daily.      . furosemide (LASIX) 40 MG tablet TAKE 1 TABLET (40 MG TOTAL) BY MOUTH DAILY. 30 tablet 5  .  LANTUS SOLOSTAR 100 UNIT/ML Solostar Pen INJECT  17  TO  20 UNITS SUBCUTANEOUSLY EVERY MORNING 30 mL 10  . Melatonin 5 MG CAPS Take 1 capsule by mouth at bedtime.     . metFORMIN (GLUCOPHAGE) 1000 MG tablet TAKE 1 TABLET TWICE DAILY 180 tablet 3  . nitroGLYCERIN (NITROSTAT) 0.4 MG SL tablet PLACE 1 TABLET (0.4 MG TOTAL) UNDER THE TONGUE EVERY 5 (FIVE) MINUTES AS NEEDED FOR CHEST PAIN (CHEST PAIN OR SHORTNESS OF BREATH). 25 tablet 4  . pantoprazole (PROTONIX) 40 MG tablet TAKE 1 TABLET (40 MG TOTAL) BY MOUTH DAILY. 90 tablet 3  . Potassium Chloride ER 20 MEQ TBCR TAKE ONE TABLET BY MOUTH DAILY 30 tablet 10  . potassium chloride SA (K-DUR,KLOR-CON) 20 MEQ tablet TAKE ONE TABLET BY MOUTH DAILY 90 tablet 3  . rosuvastatin (CRESTOR) 40 MG tablet TAKE 1 TABLET EVERY DAY 90 tablet 2  .  sertraline (ZOLOFT) 25 MG tablet TAKE 1 TABLET EVERY DAY 90 tablet 3  . sodium chloride (OCEAN) 0.65 % SOLN nasal spray Place 1 spray into both nostrils as needed for congestion.    Marland Kitchen SPIRIVA HANDIHALER 18 MCG inhalation capsule PLACE 1 CAPSULE INTO HANDIHALER AND INHALE AS DIRECTED DAILY AT 2:00PM 90 capsule 6  . traMADol (ULTRAM) 50 MG tablet Take 1 tablet (50 mg total) by mouth 3 (three) times daily as needed. One refill per month 70 tablet 3  . triamcinolone cream (KENALOG) 0.1 % Apply 1 application topically 2 (two) times daily. 30 g 0  . vitamin E 400 UNIT capsule Take 400 Units by mouth daily.      Marland Kitchen doxycycline (VIBRAMYCIN) 100 MG capsule Take 1 capsule (100 mg total) by mouth 2 (two) times daily. 14 capsule 0  . predniSONE (DELTASONE) 20 MG tablet Take 2 pills for 3 days, 1 pill for 4 days 10 tablet 0   No current facility-administered medications for this visit.     Objective: BP 124/78 (BP Location: Left Arm, Patient Position: Sitting, Cuff Size: Large)   Pulse 74   Temp 98 F (36.7 C) (Oral)   Ht 5\' 5"  (1.651 m)   Wt 181 lb 9.6 oz (82.4 kg)   SpO2 95%   BMI 30.22 kg/m  Gen: NAD, resting comfortably CV: RRR no murmurs rubs or gallops Lungs: diffuse mild intermittent wheeze, no obvious increased work of breathing Abdomen: soft/obese Ext: no edema Skin: warm, dry  Assessment/Plan:  COPD exacerbation S: patient has had 3 weeks of cough, sinus congestion. Has noted more wheezing and shortness of breath- albuterol helps some. Still using advair and spiriva. Nonproductive cough.worse with laying down. Weight is down 8 lbs- no increased swelling in legs.  A/P: strongly suspect copd exacerbation though thankfully seems mild. Will start with prednisone and see if she improves in 2-3 days. If not improving- she will take course of doxycycline which she agrees to also take if symptoms worsen. If she fails to improve with both of these will return to care.   I do not see any signs  of fluid overload in regards to CHF with no edema, weight down 8 lbs.   Refer to optho- needs updated eye exam  Future Appointments  Date Time Provider Sneads  07/27/2018  3:40 PM [name], MD [facility]    Lab/Order associations: IDDM type 2 - Plan: Ambulatory referral to Ophthalmology  Meds ordered this encounter  Medications  . doxycycline (VIBRAMYCIN) 100 MG capsule    Sig: Take  1 capsule (100 mg total) by mouth 2 (two) times daily.    Dispense:  14 capsule    Refill:  0  . predniSONE (DELTASONE) 20 MG tablet    Sig: Take 2 pills for 3 days, 1 pill for 4 days    Dispense:  10 tablet    Refill:  0    Return precautions advised.  [name], MD

## 2018-05-18 NOTE — Patient Instructions (Addendum)
Health Maintenance Due  Topic Date Due  . OPHTHALMOLOGY EXAM - We will call you within two weeks about your referral to a new eye doctor. If you do not hear within 3 weeks, give [place] a call.   04/28/2018   This seems like a COPD flare up- hoping you will do better just with prednisone but if you are not improving in 2-3 days, please pick up antibiotic and start this (also start this if you have worsening symptoms like fever or more shortness of breath).

## 2018-05-21 ENCOUNTER — Other Ambulatory Visit: Payer: Self-pay | Admitting: Cardiovascular Disease

## 2018-05-21 DIAGNOSIS — I2510 Atherosclerotic heart disease of native coronary artery without angina pectoris: Secondary | ICD-10-CM

## 2018-05-29 DIAGNOSIS — C44722 Squamous cell carcinoma of skin of right lower limb, including hip: Secondary | ICD-10-CM | POA: Diagnosis not present

## 2018-06-21 ENCOUNTER — Encounter

## 2018-07-16 ENCOUNTER — Other Ambulatory Visit: Payer: Self-pay | Admitting: Family Medicine

## 2018-07-18 HISTORY — PX: CATARACT EXTRACTION: SUR2

## 2018-07-27 ENCOUNTER — Encounter: Payer: Self-pay | Admitting: Family Medicine

## 2018-07-27 ENCOUNTER — Ambulatory Visit (INDEPENDENT_AMBULATORY_CARE_PROVIDER_SITE_OTHER): Payer: Medicare HMO | Admitting: Family Medicine

## 2018-07-27 VITALS — BP 132/56 | HR 63 | Temp 98.2°F | Ht 65.0 in | Wt 185.4 lb

## 2018-07-27 DIAGNOSIS — Z6830 Body mass index (BMI) 30.0-30.9, adult: Secondary | ICD-10-CM

## 2018-07-27 DIAGNOSIS — I10 Essential (primary) hypertension: Secondary | ICD-10-CM | POA: Diagnosis not present

## 2018-07-27 DIAGNOSIS — E119 Type 2 diabetes mellitus without complications: Secondary | ICD-10-CM

## 2018-07-27 DIAGNOSIS — E785 Hyperlipidemia, unspecified: Secondary | ICD-10-CM | POA: Diagnosis not present

## 2018-07-27 DIAGNOSIS — I25118 Atherosclerotic heart disease of native coronary artery with other forms of angina pectoris: Secondary | ICD-10-CM

## 2018-07-27 DIAGNOSIS — M170 Bilateral primary osteoarthritis of knee: Secondary | ICD-10-CM | POA: Diagnosis not present

## 2018-07-27 DIAGNOSIS — I5032 Chronic diastolic (congestive) heart failure: Secondary | ICD-10-CM

## 2018-07-27 DIAGNOSIS — J449 Chronic obstructive pulmonary disease, unspecified: Secondary | ICD-10-CM

## 2018-07-27 DIAGNOSIS — Z794 Long term (current) use of insulin: Secondary | ICD-10-CM

## 2018-07-27 DIAGNOSIS — F411 Generalized anxiety disorder: Secondary | ICD-10-CM

## 2018-07-27 NOTE — Assessment & Plan Note (Signed)
COPD doing well since exacerbation in fall- had to use antibiotic. Still taking adviar and spiriva.

## 2018-07-27 NOTE — Assessment & Plan Note (Signed)
S: Controlled onLasix 40mg  daily and potassium with it. No edema. No worsening shortness of breath. Weight stable at home at 179.  A/P: Doing well- continue current medications.  Update BMP

## 2018-07-27 NOTE — Assessment & Plan Note (Signed)
S: Controlled on amlodipine 10 mg, Coreg 12.5 mg twice daily, Lasix 40 mg, enalapril 20 mg twice daily A/P: Stable-continue current medications

## 2018-07-27 NOTE — Assessment & Plan Note (Addendum)
S: Still on plavix and aspirin given CAD history.  She is also on a statin. A/P: Appears stable-continue current medications

## 2018-07-27 NOTE — Progress Notes (Signed)
Subjective:  Michelle [name] is a 85 y.o. year old very pleasant female patient who presents for/with See problem oriented charting ROS-occasional chest pain relieved by nitroglycerin. No hypoglycemia. No hyperglycemia. No shortness of breath above baseline. Has bad knee pain.    Past Medical History-  Patient Active Problem List   Diagnosis Date Noted  . Chronic diastolic CHF (congestive heart failure) ([place]) 03/13/2014    Priority: High  . CAD- RCA PCI '90s, STEMI-RCA DES 02/18/14 03/05/2014    Priority: High  . IDDM type 2 03/26/2007    Priority: High  . Anemia, iron deficiency 10/23/2015    Priority: Medium  . Osteoarthritis, knee 05/13/2014    Priority: Medium  . Anxiety state 04/21/2014    Priority: Medium  . SOB (shortness of breath) 03/05/2014    Priority: Medium  . Hyperlipidemia with target LDL less than 70 03/26/2007    Priority: Medium  . Essential hypertension 04/21/2006    Priority: Medium  . COPD (chronic obstructive pulmonary disease) ([place]) 04/21/2006    Priority: Medium  . Actinic keratosis 11/17/2017    Priority: Low  . CKD (chronic kidney disease), stage II 07/25/2014    Priority: Low  . Back pain, lumbosacral 10/10/2012    Priority: Low  . Chest pain 12/14/2014    Medications- reviewed and updated Current Outpatient Medications  Medication Sig Dispense Refill  . ACCU-CHEK FASTCLIX LANCETS MISC     . ACCU-CHEK SMARTVIEW test strip TEST THREE TIMES DAILY 300 each 1  . acetaminophen (TYLENOL) 325 MG tablet Take 650 mg by mouth every 6 (six) hours as needed for mild pain. Reported on 10/23/2015    . albuterol (PROVENTIL HFA;VENTOLIN HFA) 108 (90 Base) MCG/ACT inhaler Inhale 1 puff into the lungs every 4 (four) hours as needed for wheezing or shortness of breath. 1 Inhaler 0  . albuterol (PROVENTIL) (2.5 MG/3ML) 0.083% nebulizer solution TAKE 3ML (2.5MG  TOTAL) BY NEBULIZATION EVERY 6 HOURS AS NEEDED FOR WHEEZING OR FOR SHORTNESS OF BREATH 75 vial 4  . Alcohol  Swabs (B-D SINGLE USE SWABS REGULAR) PADS USE  FOR  TESTING THREE TIMES DAILY 300 each 1  . amLODipine (NORVASC) 10 MG tablet TAKE 1 TABLET EVERY DAY 90 tablet 3  . aspirin 81 MG tablet Take 81 mg by mouth daily.     . B-D ULTRAFINE III SHORT PEN 31G X 8 MM MISC USE AS DIRECTED WITH LANTUS SOLOSTAR 90 each 10  . Blood Glucose Calibration (ACCU-CHEK SMARTVIEW CONTROL) LIQD     . carvedilol (COREG) 12.5 MG tablet TAKE 1 TABLET BY MOUTH TWICE DAILY WITH A MEAL 180 tablet 4  . clopidogrel (PLAVIX) 75 MG tablet TAKE ONE TABLET BY MOUTH DAILY 90 tablet 2  . enalapril (VASOTEC) 20 MG tablet TAKE 1 TABLET TWICE DAILY 180 tablet 3  . fenofibrate micronized (LOFIBRA) 134 MG capsule TAKE ONE CAPSULE BY MOUTH DAILY 90 capsule 2  . ferrous sulfate 325 (65 FE) MG tablet Take 1 tablet (325 mg total) by mouth 2 (two) times daily with a meal. 60 tablet 3  . fish oil-omega-3 fatty acids 1000 MG capsule Take 2 g by mouth daily.      . Fluticasone-Salmeterol (ADVAIR) 250-50 MCG/DOSE AEPB INHALE 1 PUFF INTO THE LUNGS DAILY AS NEEDED 60 each 1  . folic acid (FOLVITE) 283 MCG tablet Take 400 mcg by mouth daily.      . furosemide (LASIX) 40 MG tablet TAKE 1 TABLET (40 MG TOTAL) BY MOUTH DAILY. 30 tablet  5  . LANTUS SOLOSTAR 100 UNIT/ML Solostar Pen INJECT  17  TO  20 UNITS SUBCUTANEOUSLY EVERY MORNING 30 mL 10  . Melatonin 5 MG CAPS Take 1 capsule by mouth at bedtime.     . metFORMIN (GLUCOPHAGE) 1000 MG tablet TAKE 1 TABLET TWICE DAILY 180 tablet 3  . nitroGLYCERIN (NITROSTAT) 0.4 MG SL tablet PLACE 1 TABLET (0.4 MG TOTAL) UNDER THE TONGUE EVERY 5 (FIVE) MINUTES AS NEEDED FOR CHEST PAIN (CHEST PAIN OR SHORTNESS OF BREATH). 25 tablet 4  . pantoprazole (PROTONIX) 40 MG tablet TAKE 1 TABLET (40 MG TOTAL) BY MOUTH DAILY. 90 tablet 3  . Potassium Chloride ER 20 MEQ TBCR TAKE ONE TABLET BY MOUTH DAILY 30 tablet 10  . potassium chloride SA (K-DUR,KLOR-CON) 20 MEQ tablet TAKE ONE TABLET BY MOUTH DAILY 90 tablet 3  .  rosuvastatin (CRESTOR) 40 MG tablet TAKE 1 TABLET EVERY DAY 90 tablet 2  . sertraline (ZOLOFT) 25 MG tablet TAKE 1 TABLET EVERY DAY 90 tablet 3  . sodium chloride (OCEAN) 0.65 % SOLN nasal spray Place 1 spray into both nostrils as needed for congestion.    Marland Kitchen SPIRIVA HANDIHALER 18 MCG inhalation capsule PLACE 1 CAPSULE INTO HANDIHALER AND INHALE AS DIRECTED DAILY AT 2:00PM 90 capsule 6  . traMADol (ULTRAM) 50 MG tablet Take 1 tablet (50 mg total) by mouth 3 (three) times daily as needed. One refill per month 70 tablet 3  . triamcinolone cream (KENALOG) 0.1 % Apply 1 application topically 2 (two) times daily. 30 g 0  . vitamin E 400 UNIT capsule Take 400 Units by mouth daily.       No current facility-administered medications for this visit.     Objective: BP (!) 132/56 (BP Location: Left Arm, Patient Position: Sitting, Cuff Size: Large)   Pulse 63   Temp 98.2 F (36.8 C) (Oral)   Ht 5\' 5"  (1.651 m)   Wt 185 lb 6.4 oz (84.1 kg)   SpO2 95%   BMI 30.85 kg/m  Gen: NAD, resting comfortably CV: RRR no murmurs rubs or gallops Lungs: CTAB no crackles, wheeze, rhonchi Abdomen: soft/nontender/nondistended/obese Ext: no edema Skin: warm, dry Neuro: wheelchair bound  Assessment/Plan:  Other notes: 1.fingers tend to get stiff and hurt- feels like has to rub them to get them feeling better.  We discussed likely arthritis related  IDDM type 2 S:Blood sugar looks good from 91-154 fasting over last few weeks mostly concentrated in low 100s. Still taking lantus 18 units, metformin 1 g in morning and 500mg  in evening.  A/P: Hopefully stable-appears to be based off of CBGs.  Update A1c today   CAD- RCA PCI '90s, STEMI-RCA DES 02/18/14 S: Still on plavix and aspirin given CAD history.  She is also on a statin. A/P: Appears stable-continue current medications  Chronic diastolic CHF (congestive heart failure) S: Controlled onLasix 40mg  daily and potassium with it. No edema. No worsening shortness  of breath. Weight stable at home at 179.  A/P: Doing well- continue current medications.  Update BMP  Osteoarthritis, knee S:Tramadol mostly twice a day is helping with the knee arthritis but still hurts a fair deal. Not interested in surgery.  A/P: Stable-continue current medications  Hyperlipidemia with target LDL less than 70 S: Reasonably controlled on Rosuvastatin 40mg , fenbofibrate Lab Results  Component Value Date   CHOL 125 11/17/2017   HDL 45 (L) 11/17/2017   LDLCALC 41 11/17/2017   LDLDIRECT 38 06/23/2017   TRIG 368 (H) 11/17/2017  CHOLHDL 2.8 11/17/2017   A/P: Stable-continue current medications-would love to see triglyceride somewhat lower-we did discuss importance of weight loss though gradual would be strongly preferred.  Noted BMI elevation in 30-31 range    Essential hypertension S: Controlled on amlodipine 10 mg, Coreg 12.5 mg twice daily, Lasix 40 mg, enalapril 20 mg twice daily A/P: Stable-continue current medications  Anxiety state Doing well on zoloft for anxiety- no SI.   COPD (chronic obstructive pulmonary disease) (HCC) COPD doing well since exacerbation in fall- had to use antibiotic. Still taking adviar and spiriva.    Future Appointments  Date Time Provider [place]  11/30/2018  3:40 PM [name], MD [facility]   Return in about 4 months (around 11/25/2018) for physical.  Lab/Order associations: IDDM type 2 - Plan: LDL cholesterol, direct, Hemoglobin O5D, Basic metabolic panel, Basic metabolic panel, Hemoglobin A1c, LDL cholesterol, direct, CANCELED: Basic metabolic panel, CANCELED: Hemoglobin A1c, CANCELED: LDL cholesterol, direct  Chronic diastolic CHF (congestive heart failure) (HCC)  Primary osteoarthritis of both knees  Chronic obstructive pulmonary disease, unspecified COPD type (HCC)  Essential hypertension  Hyperlipidemia with target LDL less than 70  Coronary artery disease involving native coronary artery of  native heart with other form of angina pectoris (HCC)  Anxiety state  BMI 30.0-30.9,adult  Return precautions advised.  [name], MD

## 2018-07-27 NOTE — Patient Instructions (Addendum)
Health Maintenance Due  Topic Date Due  . OPHTHALMOLOGY EXAM - referral is already in- call [phone] at [facility]   [address], [place], [place] [address] 04/28/2018   Please stop by the lab before you leave today   No changes today! Glad you are doing so well

## 2018-07-27 NOTE — Assessment & Plan Note (Signed)
S:Tramadol mostly twice a day is helping with the knee arthritis but still hurts a fair deal. Not interested in surgery.  A/P: Stable-continue current medications

## 2018-07-27 NOTE — Assessment & Plan Note (Signed)
S: Reasonably controlled on Rosuvastatin 40mg , fenbofibrate Lab Results  Component Value Date   CHOL 125 11/17/2017   HDL 45 (L) 11/17/2017   LDLCALC 41 11/17/2017   LDLDIRECT 38 06/23/2017   TRIG 368 (H) 11/17/2017   CHOLHDL 2.8 11/17/2017   A/P: Stable-continue current medications-would love to see triglyceride somewhat lower-we did discuss importance of weight loss though gradual would be strongly preferred.  Noted BMI elevation in 30-31 range

## 2018-07-27 NOTE — Assessment & Plan Note (Signed)
S:Blood sugar looks good from 91-154 fasting over last few weeks mostly concentrated in low 100s. Still taking lantus 18 units, metformin 1 g in morning and 500mg  in evening.  A/P: Hopefully stable-appears to be based off of CBGs.  Update A1c today

## 2018-07-27 NOTE — Assessment & Plan Note (Signed)
Doing well on zoloft for anxiety- no SI.

## 2018-07-28 LAB — HEMOGLOBIN A1C
Hgb A1c MFr Bld: 6.3 % of total Hgb — ABNORMAL HIGH (ref <5.7)
MEAN PLASMA GLUCOSE: 134 (calc)
eAG (mmol/L): 7.4 (calc)

## 2018-07-28 LAB — BASIC METABOLIC PANEL
BUN: 14 mg/dL (ref 7–25)
CO2: 26 mmol/L (ref 20–32)
Calcium: 9.3 mg/dL (ref 8.6–10.4)
Chloride: 101 mmol/L (ref 98–110)
Creat: 0.78 mg/dL (ref 0.60–0.88)
Glucose, Bld: 85 mg/dL (ref 65–99)
Potassium: 4.1 mmol/L (ref 3.5–5.3)
Sodium: 138 mmol/L (ref 135–146)

## 2018-07-28 LAB — LDL CHOLESTEROL, DIRECT: Direct LDL: 37 mg/dL (ref <100)

## 2018-08-06 ENCOUNTER — Other Ambulatory Visit: Payer: Self-pay | Admitting: Family Medicine

## 2018-08-15 ENCOUNTER — Other Ambulatory Visit: Payer: Self-pay | Admitting: Family Medicine

## 2018-09-07 ENCOUNTER — Other Ambulatory Visit: Payer: Self-pay | Admitting: Family Medicine

## 2018-09-12 ENCOUNTER — Other Ambulatory Visit: Payer: Self-pay | Admitting: Family Medicine

## 2018-09-15 ENCOUNTER — Other Ambulatory Visit: Payer: Self-pay | Admitting: Family Medicine

## 2018-09-24 DIAGNOSIS — S81801D Unspecified open wound, right lower leg, subsequent encounter: Secondary | ICD-10-CM | POA: Diagnosis not present

## 2018-11-19 ENCOUNTER — Telehealth: Payer: Self-pay | Admitting: Family Medicine

## 2018-11-19 ENCOUNTER — Other Ambulatory Visit: Payer: Self-pay

## 2018-11-19 MED ORDER — INSULIN GLARGINE 100 UNIT/ML SOLOSTAR PEN: PEN_INJECTOR | SUBCUTANEOUS | 10 refills | Status: DC

## 2018-11-19 NOTE — Telephone Encounter (Signed)
Rx refilled. Pt notified.  

## 2018-11-19 NOTE — Telephone Encounter (Signed)
See note

## 2018-11-19 NOTE — Telephone Encounter (Signed)
Copied from [place] [id number]. Topic: Quick Communication - Rx Refill/Question >> Nov 19, 2018  1:24 PM Rayann Heman wrote: Medication:LANTUS SOLOSTAR needles completley out. Will need to take medication tomorrow. Please advise   Has the patient contacted their pharmacy? no  Preferred Pharmacy (with phone number or street name):[facility] [address], [place] - [address] [phone] (Phone) [phone] (Fax)    Agent: Please be advised that RX refills may take up to 3 business days. We ask that you follow-up with your pharmacy.

## 2018-11-20 ENCOUNTER — Other Ambulatory Visit: Payer: Self-pay

## 2018-11-20 MED ORDER — INSULIN PEN NEEDLE 31G X 8 MM MISC: 3 refills | Status: DC

## 2018-11-30 ENCOUNTER — Other Ambulatory Visit: Payer: Self-pay | Admitting: Family Medicine

## 2018-11-30 ENCOUNTER — Encounter: Payer: Medicare HMO | Admitting: Family Medicine

## 2018-11-30 NOTE — Telephone Encounter (Signed)
Rx refill Last fill 08/16/18  #70/2 Last ov 07/27/18

## 2019-01-05 ENCOUNTER — Other Ambulatory Visit: Payer: Self-pay | Admitting: Cardiovascular Disease

## 2019-01-05 ENCOUNTER — Other Ambulatory Visit: Payer: Self-pay | Admitting: Family Medicine

## 2019-01-05 DIAGNOSIS — I2510 Atherosclerotic heart disease of native coronary artery without angina pectoris: Secondary | ICD-10-CM

## 2019-01-09 ENCOUNTER — Other Ambulatory Visit: Payer: Self-pay | Admitting: Family Medicine

## 2019-01-30 ENCOUNTER — Other Ambulatory Visit: Payer: Self-pay | Admitting: Family Medicine

## 2019-02-07 ENCOUNTER — Other Ambulatory Visit: Payer: Self-pay | Admitting: Family Medicine

## 2019-02-07 NOTE — Telephone Encounter (Signed)
Last OV 07/27/18 Last refill 12/01/18 #70/1 Next OV 02/22/19

## 2019-02-22 ENCOUNTER — Other Ambulatory Visit: Payer: Self-pay

## 2019-02-22 ENCOUNTER — Ambulatory Visit (INDEPENDENT_AMBULATORY_CARE_PROVIDER_SITE_OTHER): Payer: Medicare HMO | Admitting: Family Medicine

## 2019-02-22 ENCOUNTER — Encounter: Payer: Self-pay | Admitting: Family Medicine

## 2019-02-22 VITALS — BP 136/72 | HR 66 | Temp 98.5°F | Ht 65.0 in | Wt 179.2 lb

## 2019-02-22 DIAGNOSIS — D509 Iron deficiency anemia, unspecified: Secondary | ICD-10-CM | POA: Diagnosis not present

## 2019-02-22 DIAGNOSIS — Z0000 Encounter for general adult medical examination without abnormal findings: Secondary | ICD-10-CM | POA: Diagnosis not present

## 2019-02-22 DIAGNOSIS — I25118 Atherosclerotic heart disease of native coronary artery with other forms of angina pectoris: Secondary | ICD-10-CM | POA: Diagnosis not present

## 2019-02-22 DIAGNOSIS — I10 Essential (primary) hypertension: Secondary | ICD-10-CM | POA: Diagnosis not present

## 2019-02-22 DIAGNOSIS — I5032 Chronic diastolic (congestive) heart failure: Secondary | ICD-10-CM

## 2019-02-22 DIAGNOSIS — E119 Type 2 diabetes mellitus without complications: Secondary | ICD-10-CM

## 2019-02-22 DIAGNOSIS — J449 Chronic obstructive pulmonary disease, unspecified: Secondary | ICD-10-CM

## 2019-02-22 DIAGNOSIS — E785 Hyperlipidemia, unspecified: Secondary | ICD-10-CM

## 2019-02-22 DIAGNOSIS — Z794 Long term (current) use of insulin: Secondary | ICD-10-CM | POA: Diagnosis not present

## 2019-02-22 DIAGNOSIS — E1159 Type 2 diabetes mellitus with other circulatory complications: Secondary | ICD-10-CM | POA: Diagnosis not present

## 2019-02-22 DIAGNOSIS — M170 Bilateral primary osteoarthritis of knee: Secondary | ICD-10-CM

## 2019-02-22 DIAGNOSIS — Z79899 Other long term (current) drug therapy: Secondary | ICD-10-CM

## 2019-02-22 DIAGNOSIS — E1169 Type 2 diabetes mellitus with other specified complication: Secondary | ICD-10-CM

## 2019-02-22 LAB — POC URINALSYSI DIPSTICK (AUTOMATED)
Bilirubin, UA: NEGATIVE
Blood, UA: NEGATIVE
Glucose, UA: NEGATIVE
Ketones, UA: NEGATIVE
Leukocytes, UA: NEGATIVE
Nitrite, UA: NEGATIVE
Protein, UA: NEGATIVE
Spec Grav, UA: 1.02 (ref 1.010–1.025)
Urobilinogen, UA: 0.2 E.U./dL
pH, UA: 5 (ref 5.0–8.0)

## 2019-02-22 MED ORDER — TRAMADOL HCL 50 MG PO TABS: ORAL_TABLET | ORAL | 5 refills | Status: DC

## 2019-02-22 NOTE — Progress Notes (Signed)
Phone: [phone]   Subjective:  Patient presents today for their annual physical. Chief complaint-noted.   See problem oriented charting- ROS- full  review of systems was completed and negative except for: stable shortness of breath an dknee pain  The following were reviewed and entered/updated in epic: Past Medical History:  Diagnosis Date  . Anginal pain ([place])   . Arthritis   . AV block, 1st degree   . CAD (coronary artery disease) 1990; 2015   Cardiac cath 1990 with Dr. Lia Foyer and [name] Residual CAD, non obstructive per 2015 cath. STEMI status post stent in August 2015.  Marland Kitchen COPD (chronic obstructive pulmonary disease) ([place])   . Diabetes mellitus type 2, insulin dependent ([place])   . Essential hypertension   . Hyperlipidemia with target LDL less than 70   . Myocardial infarction ([place])   . Pericarditis-post MI (short course of steroids) 03/06/2014  . 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 Shortness of breath    Patient Active Problem List   Diagnosis Date Noted  . Chronic diastolic CHF (congestive heart failure) ([place]) 03/13/2014    Priority: High  . CAD- RCA PCI '90s, STEMI-RCA DES 02/18/14 03/05/2014    Priority: High  . IDDM type 2 03/26/2007    Priority: High  . Anemia, iron deficiency 10/23/2015    Priority: Medium  . Osteoarthritis, knee 05/13/2014    Priority: Medium  . Anxiety state 04/21/2014    Priority: Medium  . SOB (shortness of breath) 03/05/2014    Priority: Medium  . Hyperlipidemia associated with type 2 diabetes mellitus ([place]) 03/26/2007    Priority: Medium  . Hypertension associated with diabetes ([place]) 04/21/2006    Priority: Medium  . COPD (chronic obstructive pulmonary disease) ([place]) 04/21/2006    Priority: Medium  .  Actinic keratosis 11/17/2017    Priority: Low  . CKD (chronic kidney disease), stage II 07/25/2014    Priority: Low  . Back pain, lumbosacral 10/10/2012    Priority: Low  . Chest pain 12/14/2014   Past Surgical History:  Procedure Laterality Date  . CATARACT EXTRACTION  2010  . CHOLECYSTECTOMY    . CORONARY ANGIOPLASTY WITH STENT PLACEMENT  02/18/14   Promus DES to RCA  . LEFT HEART CATHETERIZATION WITH CORONARY ANGIOGRAM N/A 02/18/2014   Procedure: LEFT HEART CATHETERIZATION WITH CORONARY ANGIOGRAM;  Surgeon: Jettie Booze, MD;  Location: [facility] CATH LAB;  Service: Cardiovascular;  Laterality: N/A;  . PTCA  1990   PTCA of RCA  . TRANSTHORACIC ECHOCARDIOGRAM  02/02/2012   mild LVH, EF 55-60%, Normal WM, Gr 1 DD; Mild MR    Family History  Problem Relation Age of Onset  . Heart attack Mother 77  . Cancer Father 65  . Cancer Maternal Grandfather   . Cancer Paternal Grandmother   . Cancer Paternal Grandfather   . Diabetes Son   . Liver cancer Brother   . Bone cancer Brother   . Heart attack Son   . Hypertension Son   . Diabetes Son   . Hyperlipidemia Son     Medications- reviewed and updated Current Outpatient Medications  Medication Sig Dispense Refill  . ACCU-CHEK FASTCLIX LANCETS MISC     . ACCU-CHEK SMARTVIEW test strip TEST BLOOD SUGAR THREE  TIMES DAILY 300 strip 1  . acetaminophen (TYLENOL) 325 MG tablet Take 650 mg by mouth every 6 (six) hours as needed for mild pain. Reported on 10/23/2015    . albuterol (PROVENTIL HFA;VENTOLIN HFA) 108 (90 Base) MCG/ACT inhaler Inhale 1 puff into the lungs every 4 (four) hours as needed for wheezing or shortness of breath. 1 Inhaler 0  . albuterol (PROVENTIL) (2.5 MG/3ML) 0.083% nebulizer solution TAKE 3ML (2.5MG  TOTAL) BY NEBULIZATION EVERY 6 HOURS AS NEEDED FOR WHEEZING OR FOR SHORTNESS OF BREATH 75 vial 4  . Alcohol Swabs (B-D SINGLE USE SWABS REGULAR) PADS USE  FOR  TESTING THREE TIMES DAILY 300 each 1  . amLODipine (NORVASC) 10 MG  tablet TAKE 1 TABLET EVERY DAY 90 tablet 3  . aspirin 81 MG tablet Take 81 mg by mouth daily.     . Blood Glucose Calibration (ACCU-CHEK SMARTVIEW CONTROL) LIQD     . carvedilol (COREG) 12.5 MG tablet TAKE 1 TABLET BY MOUTH TWICE DAILY WITH A MEAL 180 tablet 4  . clopidogrel (PLAVIX) 75 MG tablet TAKE ONE TABLET BY MOUTH DAILY 77 tablet 1  . enalapril (VASOTEC) 20 MG tablet TAKE 1 TABLET TWICE DAILY 180 tablet 3  . fenofibrate micronized (LOFIBRA) 134 MG capsule TAKE ONE CAPSULE BY MOUTH DAILY 90 capsule 1  . ferrous sulfate 325 (65 FE) MG tablet Take 1 tablet (325 mg total) by mouth 2 (two) times daily with a meal. 60 tablet 3  . fish oil-omega-3 fatty acids 1000 MG capsule Take 2 g by mouth daily.      . Fluticasone-Salmeterol (ADVAIR) 250-50 MCG/DOSE AEPB INHALE 1 PUFF INTO THE LUNGS DAILY AS NEEDED 60 each 0  . folic acid (FOLVITE) 149 MCG tablet Take 400 mcg by mouth daily.      . furosemide (LASIX) 40 MG tablet TAKE ONE TABLET BY MOUTH DAILY 90 tablet 4  . Insulin Glargine (LANTUS SOLOSTAR) 100 UNIT/ML Solostar Pen INJECT  17  TO  20 UNITS SUBCUTANEOUSLY EVERY MORNING 30 mL 10  . Insulin Pen Needle (B-D ULTRAFINE III SHORT PEN) 31G X 8 MM MISC USE AS DIRECTED WITH LANTUS SOLOSTAR 100 each 3  . Melatonin 5 MG CAPS Take 1 capsule by mouth at bedtime.     . metFORMIN (GLUCOPHAGE) 1000 MG tablet TAKE 1 TABLET TWICE DAILY 180 tablet 3  . nitroGLYCERIN (NITROSTAT) 0.4 MG SL tablet PLACE 1 TABLET (0.4 MG TOTAL) UNDER THE TONGUE EVERY 5 (FIVE) MINUTES AS NEEDED FOR CHEST PAIN (CHEST PAIN OR SHORTNESS OF BREATH). 25 tablet 4  . pantoprazole (PROTONIX) 40 MG tablet TAKE 1 TABLET (40 MG TOTAL) BY MOUTH DAILY. 90 tablet 3  . Potassium Chloride ER 20 MEQ TBCR TAKE ONE TABLET BY MOUTH DAILY 30 tablet 10  . potassium chloride SA (K-DUR,KLOR-CON) 20 MEQ tablet TAKE ONE TABLET BY MOUTH DAILY 90 tablet 3  . rosuvastatin (CRESTOR) 40 MG tablet TAKE 1 TABLET EVERY DAY 90 tablet 2  . sertraline (ZOLOFT) 25 MG  tablet TAKE 1 TABLET EVERY DAY 90 tablet 3  . sodium chloride (OCEAN) 0.65 % SOLN nasal spray Place 1 spray into both nostrils as needed for congestion.    Marland Kitchen SPIRIVA HANDIHALER 18 MCG inhalation capsule PLACE 1 CAPSULE INTO HANDIHALER AND INHALE AS DIRECTED DAILY AT 2:00PM 90 capsule 6  . traMADol (ULTRAM) 50 MG tablet TAKE ONE TABLET BY MOUTH THREE TIMES A DAY AS NEEDED (30 DAY SUPPLY) 70 tablet 0  . triamcinolone cream (KENALOG) 0.1 % Apply  1 application topically 2 (two) times daily. 30 g 0  . vitamin E 400 UNIT capsule Take 400 Units by mouth daily.       No current facility-administered medications for this visit.     Allergies-reviewed and updated Allergies  Allergen Reactions  . Codeine Sulfate Nausea Only  . Morphine Sulfate Nausea Only    Social History   Social History Narrative   Lives with her son but he works all day. Home for dinner.  Independent ADL. Needs assist on some IADL.    Objective  Objective:  BP 136/72 (BP Location: Left Arm, Patient Position: Sitting, Cuff Size: Normal)   Pulse 66   Temp 98.5 F (36.9 C) (Oral)   Ht 5\' 5"  (1.651 m)   Wt 179 lb 3.2 oz (81.3 kg)   SpO2 98%   BMI 29.82 kg/m  Gen: NAD, resting comfortably HEENT: Mucous membranes are moist. Oropharynx normal Neck: no thyromegaly or cervical lymphadenopathy CV: RRR no murmurs rubs or gallops Lungs: CTAB no crackles, wheeze, rhonchi Abdomen: soft/nontender/nondistended/normal bowel sounds. No rebound or guarding.  Ext: no edema Skin: warm, dry Neuro: grossly normal, moves all extremities, PERRLA    Assessment and Plan   85 y.o. female presenting for annual physical.  Health Maintenance counseling: 1. Anticipatory guidance: Patient counseled regarding regular dental exams - does not have any teeth- does not wear dentures (doing ok with eating)- she is concerned had too much drilling and does not plan to return, eye exams - has not had within the year- encouraged,  avoiding smoking and  second hand smoke , limiting alcohol to 1 beverage per day- doesn't drink .   2. Risk factor reduction:  Advised patient of need for regular exercise and diet rich and fruits and vegetables to reduce risk of heart attack and stroke. Exercise- not doing anything- discussed chair exercises but not too interested. Diet-has tried to eat healthier and has lost a few lbs.  Wt Readings from Last 3 Encounters:  02/22/19 179 lb 3.2 oz (81.3 kg)  07/27/18 185 lb 6.4 oz (84.1 kg)  05/18/18 181 lb 9.6 oz (82.4 kg)  3. Immunizations/screenings/ancillary studies-she declines Shingrix at pharmacy- she defers to next year with covid. .  Advised fall flu shot-we should have this available next month Immunization History  Administered Date(s) Administered  . Influenza Split 04/21/2011, 05/16/2012  . Influenza Whole 05/24/2007, 05/01/2009, 04/17/2010  . Influenza, High Dose Seasonal PF 03/23/2018  . Influenza,inj,Quad PF,6+ Mos 04/14/2013, 04/10/2014, 04/03/2015  . Influenza-Unspecified 06/01/2016, 06/15/2017  . Pneumococcal Conjugate-13 09/22/2014  . Pneumococcal Polysaccharide-23 04/18/2003  . Tdap 07/29/2011  . Zoster 04/18/2011  4. Cervical cancer screening- past age based screening.  No vaginal bleeding.   5. Breast cancer screening-  breast exam not indicated and mammogram-last mammogram 2004- past age based screening  6. Colon cancer screening -past age based screening- last colonoscopy 2004.  No rectal bleeding or melena.  No further colonoscopy  7. Skin cancer screening-seen November 2019- had another spot on her leg removed.  She does not plan to go back. advised regular sunscreen use. Denies worrisome, changing, or new skin lesions.  8. Birth control/STD check-postmenopausal and not sexually active 9. Osteoporosis screening at 30- patient has declined previously, agreed last year but then not completed- does not want to do with covid 19 right now- will ask again next year 22.  Former smoker-quit in  2008 with over 50 pack years.  Will get urinalysis  Status of chronic or acute  concerns  Hypertension - Checks at home rarely, staying consistently below 140/90. Denies dizziness, CP, SOB, visual changes. Reports occasional HA and feeling of being off balance. Following low sodium diet.  Compliant with amlodipine 10 mg, Coreg 12.5 mg twice a day, Lasix 40 mg, enalapril 20 mg twice a day.  CHF -controlled on Lasix 40 mg daily.  Also takes potassium with this.  No edema or increased weight gain.  Actually has lost some weight.  Appears euvolemic-continue current medications.  Realize amlodipine is not ideal but has been needed for blood pressure control. Wt Readings from Last 3 Encounters:  02/22/19 179 lb 3.2 oz (81.3 kg)  07/27/18 185 lb 6.4 oz (84.1 kg)  05/18/18 181 lb 9.6 oz (82.4 kg)    COPD -compliant with Advair and Spiriva.  Stable-continue current medications- declines needs for refills.    Diabetes, type 2 - Checking BG at home every morning, running  108-163 fasting. Denies hypoglycemic episodes. Eats hard candy occasionally. Aware of sugar and carb intake.  Continues to take Lantus 18 units and Metformin 1 g in the morning and 500 mg in the evening  Hyperlipidemia/CAD -patient is compliant with rosuvastatin 40 mg, fenofibrate.  She is on Plavix and aspirin per cardiology.  Lipids hopefully stable-update today  Anxiety -doing well on low-dose Zoloft for anxiety.  PHQ 9 mildly elevated at 8- no depression or anhedonia- scores 3 for energy issues and 3 for feeling bad about herself- she states this is more frustration that shes not able to do for herself with her health conditions. Offered addint counseling -she is not interested   GERD- compliant with pantoprazole- no issues No results found for: VITAMINB12 will update b12 as has never had   Knee arthritis still helped by tramadol- gave 6 month supply today   Recommended follow up: 61-month follow-up recommended  Lab/Order  associations:Not fasting   ICD-10-CM   1. Preventative health care  Z00.00   2. Coronary artery disease involving native coronary artery of native heart with other form of angina pectoris ([place])  I25.118   3. IDDM type 2  E11.9    Z79.4   4. Chronic diastolic CHF (congestive heart failure) (HCC)  I50.32   5. Primary osteoarthritis of both knees  M17.0   6. Hyperlipidemia associated with type 2 diabetes mellitus (HCC)  E11.69    E78.5   7. Hypertension associated with diabetes ([place])  E11.59    I10   8. Chronic obstructive pulmonary disease, unspecified COPD type ([place])  J44.9   9. Iron deficiency anemia, unspecified iron deficiency anemia type  D50.9     No orders of the defined types were placed in this encounter.   Return precautions advised.  [name], MD

## 2019-02-22 NOTE — Addendum Note (Signed)
Addended by: Francis Dowse T on: 02/22/2019 04:00 PM   Modules accepted: Orders

## 2019-02-22 NOTE — Patient Instructions (Addendum)
Health Maintenance Due  Topic Date Due  . OPHTHALMOLOGY EXAM - please call to schedule diabetic eye exam 04/28/2018  . FOOT EXAM-completed today 11/18/2018  . HEMOGLOBIN A1C-today with labs 01/25/2019  . INFLUENZA VACCINE-We should have flu shots available by September. Please strongly consider getting flu shot this year. If you get your flu shot at a pharmacy- please let us know.  02/16/2019     Please stop by lab before you go If you do not have mychart- we will call you about results within 5 business days of [place] receiving them.  If you have mychart- we will send your results within 3 business days of [place] receiving them.  If abnormal or we want to clarify a result, we will call or mychart you to make sure you receive the message.  If you have questions or concerns or don't hear within 5-7 days, please send [place] a message or call us.

## 2019-02-23 LAB — COMPREHENSIVE METABOLIC PANEL
AG Ratio: 1.6 (calc) (ref 1.0–2.5)
ALT: 11 U/L (ref 6–29)
AST: 14 U/L (ref 10–35)
Albumin: 4.4 g/dL (ref 3.6–5.1)
Alkaline phosphatase (APISO): 47 U/L (ref 37–153)
BUN: 18 mg/dL (ref 7–25)
CO2: 25 mmol/L (ref 20–32)
Calcium: 9.9 mg/dL (ref 8.6–10.4)
Chloride: 100 mmol/L (ref 98–110)
Creat: 0.81 mg/dL (ref 0.60–0.88)
Globulin: 2.7 g/dL (calc) (ref 1.9–3.7)
Glucose, Bld: 111 mg/dL — ABNORMAL HIGH (ref 65–99)
Potassium: 3.8 mmol/L (ref 3.5–5.3)
Sodium: 139 mmol/L (ref 135–146)
Total Bilirubin: 0.3 mg/dL (ref 0.2–1.2)
Total Protein: 7.1 g/dL (ref 6.1–8.1)

## 2019-02-23 LAB — CBC
HCT: 39 % (ref 35.0–45.0)
Hemoglobin: 13.1 g/dL (ref 11.7–15.5)
MCH: 29.4 pg (ref 27.0–33.0)
MCHC: 33.6 g/dL (ref 32.0–36.0)
MCV: 87.4 fL (ref 80.0–100.0)
MPV: 14.5 fL — ABNORMAL HIGH (ref 7.5–12.5)
Platelets: 212 10*3/uL (ref 140–400)
RBC: 4.46 10*6/uL (ref 3.80–5.10)
RDW: 12.6 % (ref 11.0–15.0)
WBC: 7.6 10*3/uL (ref 3.8–10.8)

## 2019-02-23 LAB — LIPID PANEL
Cholesterol: 134 mg/dL (ref <200)
HDL: 41 mg/dL — ABNORMAL LOW (ref >=50)
Non-HDL Cholesterol (Calc): 93 mg/dL (calc) (ref <130)
Total CHOL/HDL Ratio: 3.3 (calc) (ref <5.0)
Triglycerides: 436 mg/dL — ABNORMAL HIGH (ref <150)

## 2019-02-23 LAB — HEMOGLOBIN A1C
Hgb A1c MFr Bld: 6.5 % of total Hgb — ABNORMAL HIGH (ref <5.7)
Mean Plasma Glucose: 140 (calc)
eAG (mmol/L): 7.7 (calc)

## 2019-02-23 LAB — VITAMIN B12: Vitamin B-12: 268 pg/mL (ref 200–1100)

## 2019-03-01 ENCOUNTER — Other Ambulatory Visit: Payer: Self-pay | Admitting: Family Medicine

## 2019-03-08 ENCOUNTER — Other Ambulatory Visit: Payer: Self-pay | Admitting: Family Medicine

## 2019-03-11 ENCOUNTER — Other Ambulatory Visit: Payer: Self-pay | Admitting: Cardiovascular Disease

## 2019-03-14 ENCOUNTER — Other Ambulatory Visit: Payer: Self-pay | Admitting: Cardiovascular Disease

## 2019-03-20 ENCOUNTER — Ambulatory Visit (INDEPENDENT_AMBULATORY_CARE_PROVIDER_SITE_OTHER): Payer: Medicare HMO | Admitting: Ophthalmology

## 2019-03-20 ENCOUNTER — Encounter (INDEPENDENT_AMBULATORY_CARE_PROVIDER_SITE_OTHER): Payer: Self-pay | Admitting: Ophthalmology

## 2019-03-20 ENCOUNTER — Other Ambulatory Visit: Payer: Self-pay

## 2019-03-20 DIAGNOSIS — H35033 Hypertensive retinopathy, bilateral: Secondary | ICD-10-CM

## 2019-03-20 DIAGNOSIS — H3581 Retinal edema: Secondary | ICD-10-CM

## 2019-03-20 DIAGNOSIS — H26493 Other secondary cataract, bilateral: Secondary | ICD-10-CM

## 2019-03-20 DIAGNOSIS — I10 Essential (primary) hypertension: Secondary | ICD-10-CM

## 2019-03-20 DIAGNOSIS — E119 Type 2 diabetes mellitus without complications: Secondary | ICD-10-CM

## 2019-03-20 DIAGNOSIS — Z961 Presence of intraocular lens: Secondary | ICD-10-CM | POA: Diagnosis not present

## 2019-03-20 NOTE — Progress Notes (Signed)
[facility] Note  03/20/2019     CHIEF COMPLAINT Patient presents for Diabetic Eye Exam   HISTORY OF PRESENT ILLNESS: Michelle [name] is a 85 y.o. female who presents to the clinic today for:   HPI    Diabetic Eye Exam    Vision is blurred for near, is blurred for distance and is worsening.  Diabetes characteristics include Type 2, taking oral medications and on insulin.  This started 1 year ago.  Blood sugar level is controlled.  Last Blood Glucose 124 (This am).  Last A1C 6.5.  I, the attending physician,  performed the HPI with the patient and updated documentation appropriately.          Comments    Patient referred for diabetic eye exam. Vision blurry OU. Difficulty seeing TV screen for the past year. Doesn't wear Rx glasses. Doesn't know how long she has had diabetes, but was adult-onset. Patient is on plavix but unsure why she is taking a blood thinner.        Last edited by Bernarda Caffey, MD on 03/20/2019  3:06 PM. (History)    pt states she was sent here for DM exam from her PCP, she states her vision has been blurry lately, she is not sure whether she has had cataract sx or not  Referring physician: [name], MD [place],  [place] [address]  HISTORICAL INFORMATION:   Selected notes from the MEDICAL RECORD NUMBER Referred by Dr. Yong Channel (PCP) for DM exam LEE:  Ocular Hx- PMH-   CURRENT MEDICATIONS: No current outpatient medications on file. (Ophthalmic Drugs)   No current facility-administered medications for this visit.  (Ophthalmic Drugs)   Current Outpatient Medications (Other)  Medication Sig  . ACCU-CHEK FASTCLIX LANCETS MISC   . ACCU-CHEK SMARTVIEW test strip TEST BLOOD SUGAR THREE TIMES DAILY  . acetaminophen (TYLENOL) 325 MG tablet Take 650 mg by mouth every 6 (six) hours as needed for mild pain. Reported on 10/23/2015  . albuterol (PROVENTIL HFA;VENTOLIN HFA) 108 (90 Base) MCG/ACT inhaler Inhale 1 puff  into the lungs every 4 (four) hours as needed for wheezing or shortness of breath.  Marland Kitchen albuterol (PROVENTIL) (2.5 MG/3ML) 0.083% nebulizer solution TAKE 3ML (2.5MG  TOTAL) BY NEBULIZATION EVERY 6 HOURS AS NEEDED FOR WHEEZING OR FOR SHORTNESS OF BREATH  . Alcohol Swabs (B-D SINGLE USE SWABS REGULAR) PADS USE  FOR  TESTING THREE TIMES DAILY  . amLODipine (NORVASC) 10 MG tablet TAKE 1 TABLET EVERY DAY  . aspirin 81 MG tablet Take 81 mg by mouth daily.   . Blood Glucose Calibration (ACCU-CHEK SMARTVIEW CONTROL) LIQD   . carvedilol (COREG) 12.5 MG tablet TAKE 1 TABLET BY MOUTH TWICE DAILY WITH A MEAL  . clopidogrel (PLAVIX) 75 MG tablet TAKE ONE TABLET BY MOUTH DAILY  . enalapril (VASOTEC) 20 MG tablet TAKE 1 TABLET TWICE DAILY  . fenofibrate micronized (LOFIBRA) 134 MG capsule TAKE ONE CAPSULE BY MOUTH DAILY  . ferrous sulfate 325 (65 FE) MG tablet Take 1 tablet (325 mg total) by mouth 2 (two) times daily with a meal.  . fish oil-omega-3 fatty acids 1000 MG capsule Take 2 g by mouth daily.    . Fluticasone-Salmeterol (ADVAIR) 250-50 MCG/DOSE AEPB INHALE ONE PUFF BY MOUTH DAILY AS NEEDED  . folic acid (FOLVITE) A999333 MCG tablet Take 400 mcg by mouth daily.    . furosemide (LASIX) 40 MG tablet TAKE ONE TABLET BY MOUTH DAILY  .  Insulin Glargine (LANTUS SOLOSTAR) 100 UNIT/ML Solostar Pen INJECT  17  TO  20 UNITS SUBCUTANEOUSLY EVERY MORNING  . Insulin Pen Needle (B-D ULTRAFINE III SHORT PEN) 31G X 8 MM MISC USE AS DIRECTED WITH LANTUS SOLOSTAR  . KLOR-CON M20 20 MEQ tablet TAKE ONE TABLET BY MOUTH DAILY  . Melatonin 5 MG CAPS Take 1 capsule by mouth at bedtime.   . metFORMIN (GLUCOPHAGE) 1000 MG tablet TAKE 1 TABLET TWICE DAILY  . nitroGLYCERIN (NITROSTAT) 0.4 MG SL tablet PLACE 1 TABLET (0.4 MG TOTAL) UNDER THE TONGUE EVERY 5 (FIVE) MINUTES AS NEEDED FOR CHEST PAIN (CHEST PAIN OR SHORTNESS OF BREATH).  Marland Kitchen pantoprazole (PROTONIX) 40 MG tablet TAKE 1 TABLET (40 MG TOTAL) BY MOUTH DAILY.  Marland Kitchen Potassium Chloride  ER 20 MEQ TBCR TAKE ONE TABLET BY MOUTH DAILY  . rosuvastatin (CRESTOR) 40 MG tablet TAKE 1 TABLET EVERY DAY  . sertraline (ZOLOFT) 25 MG tablet TAKE 1 TABLET EVERY DAY  . sodium chloride (OCEAN) 0.65 % SOLN nasal spray Place 1 spray into both nostrils as needed for congestion.  Marland Kitchen SPIRIVA HANDIHALER 18 MCG inhalation capsule PLACE 1 CAPSULE INTO HANDIHALER AND INHALE AS DIRECTED DAILY AT 2:00PM  . traMADol (ULTRAM) 50 MG tablet TAKE ONE TABLET BY MOUTH THREE TIMES A DAY AS NEEDED (30 DAY SUPPLY)  . triamcinolone cream (KENALOG) 0.1 % Apply 1 application topically 2 (two) times daily.  . vitamin E 400 UNIT capsule Take 400 Units by mouth daily.     No current facility-administered medications for this visit.  (Other)      REVIEW OF SYSTEMS: ROS    Positive for: Endocrine, Cardiovascular, Eyes, Respiratory   Negative for: Constitutional, Gastrointestinal, Neurological, Skin, Genitourinary, Musculoskeletal, HENT, Psychiatric, Allergic/Imm, Heme/Lymph   Last edited by Roselee Nova D, COT on 03/20/2019  1:54 PM. (History)       ALLERGIES Allergies  Allergen Reactions  . Codeine Sulfate Nausea Only  . Morphine Sulfate Nausea Only    PAST MEDICAL HISTORY Past Medical History:  Diagnosis Date  . Anginal pain ([place])   . Arthritis   . AV block, 1st degree   . CAD (coronary artery disease) 1990; 2015   Cardiac cath 1990 with Dr. Lia Foyer and [name] Residual CAD, non obstructive per 2015 cath. STEMI status post stent in August 2015.  Marland Kitchen COPD (chronic obstructive pulmonary disease) ([place])   . Diabetes mellitus type 2, insulin dependent ([place])   . Essential hypertension   . Hyperlipidemia with target LDL less than 70   . Myocardial infarction ([place])   . Pericarditis-post MI (short course of steroids) 03/06/2014  . 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 Shortness of breath    Past Surgical History:  Procedure Laterality Date  . CATARACT EXTRACTION  2010  . CHOLECYSTECTOMY    . CORONARY ANGIOPLASTY WITH STENT PLACEMENT  02/18/14   Promus DES to RCA  . LEFT HEART CATHETERIZATION WITH CORONARY ANGIOGRAM N/A 02/18/2014   Procedure: LEFT HEART CATHETERIZATION WITH CORONARY ANGIOGRAM;  Surgeon: Jettie Booze, MD;  Location: [facility] CATH LAB;  Service: Cardiovascular;  Laterality: N/A;  . PTCA  1990   PTCA of RCA  . TRANSTHORACIC ECHOCARDIOGRAM  02/02/2012   mild LVH, EF 55-60%, Normal WM,  Gr 1 DD; Mild MR    FAMILY HISTORY Family History  Problem Relation Age of Onset  . Heart attack Mother 42  . Cancer Father 19  . Cancer Maternal Grandfather   . Cancer Paternal Grandmother   . Cancer Paternal Grandfather   . Diabetes Son   . Liver cancer Brother   . Bone cancer Brother   . Heart attack Son   . Hypertension Son   . Diabetes Son   . Hyperlipidemia Son     SOCIAL HISTORY Social History   Tobacco Use  . Smoking status: Former Smoker    Packs/day: 1.00    Years: 55.00    Pack years: 55.00    Types: Cigarettes    Quit date: 09/29/2006    Years since quitting: 12.4  . Smokeless tobacco: Never Used  Substance Use Topics  . Alcohol use: No  . Drug use: No         OPHTHALMIC EXAM:  Base Eye Exam    Visual Acuity (Snellen - Linear)      Right Left   Dist [place] 20/200 20/50 +2   Dist ph [place] 20/70 20/30       Tonometry   Attempted IOP. Patient refused measurement.        Pupils      Dark Light Shape React APD   Right 4 3 Round Slow None   Left 4 3 Round Slow None       Visual Fields (Counting fingers)      Left Right    Full Full       Extraocular Movement      Right Left    Full, Ortho Full, Ortho       Neuro/Psych    Oriented x3: Yes   Mood/Affect: Normal       Dilation    Both eyes: 1.0% Mydriacyl, 2.5%  Phenylephrine @ 2:09 PM        Slit Lamp and Fundus Exam    Slit Lamp Exam      Right Left   Lids/Lashes Dermatochalasis - lower lid, Meibomian gland dysfunction Dermatochalasis - lower lid, Meibomian gland dysfunction, Ptosis   Conjunctiva/Sclera White and quiet White and quiet   Cornea Arcus, trace Punctate epithelial erosions Arcus, trace Punctate epithelial erosions   Anterior Chamber Deep and quiet Deep and quiet   Iris Round and dilated, No NVI Round and dilated, No NVI   Lens PC IOL in good position, 3+ Posterior capsular opacification PC IOL in good position, 1-2+ Posterior capsular opacification   Vitreous Vitreous syneresis, Posterior vitreous detachment Vitreous syneresis, Posterior vitreous detachment       Fundus Exam      Right Left   Disc trace Pallor, mild temporal Peripapillary atrophy trace Pallor, mild temporal Peripapillary atrophy   C/D Ratio 0.4 0.4   Macula Flat, Blunted foveal reflex, RPE mottling and clumping, No heme or edema Flat, Blunted foveal reflex, RPE mottling and clumping, No heme or edema   Vessels Vascular attenuation, Tortuous Vascular attenuation, Tortuous   Periphery Attached , reticular degeneration, no heme Attached , reticular degeneration, no heme        Refraction    Manifest Refraction      Sphere Cylinder Axis Dist VA   Right -0.75 +0.75 050 20/150   Left -0.25 +0.50 180 20/40  Difficult-patient won't keep eyes open for retinoscopy, dim reflex OU.          IMAGING AND PROCEDURES  Imaging and  Procedures for @TODAY @  OCT, Retina - OU - Both Eyes       Right Eye Quality was good. Central Foveal Thickness: 263. Progression has no prior data. Findings include normal foveal contour, no SRF, no IRF.   Left Eye Quality was good. Central Foveal Thickness: 258. Progression has no prior data. Findings include normal foveal contour, no IRF, no SRF.   Notes *Images captured and stored on drive  Diagnosis / Impression:  NFP, no  IRF/SRF OU No DME OU  Clinical management:  See below  Abbreviations: NFP - Normal foveal profile. CME - cystoid macular edema. PED - pigment epithelial detachment. IRF - intraretinal fluid. SRF - subretinal fluid. EZ - ellipsoid zone. ERM - epiretinal membrane. ORA - outer retinal atrophy. ORT - outer retinal tubulation. SRHM - subretinal hyper-reflective material                 ASSESSMENT/PLAN:    ICD-10-CM   1. Diabetes mellitus type 2 without retinopathy ([place])  E11.9   2. Retinal edema  H35.81 OCT, Retina - OU - Both Eyes  3. Essential hypertension  I10   4. Hypertensive retinopathy of both eyes  H35.033   5. Pseudophakia of both eyes  Z96.1   6. PCO (posterior capsular opacification), bilateral  H26.493     1,2. Diabetes mellitus, type 2 without retinopathy  - The incidence, risk factors for progression, natural history and treatment options for diabetic retinopathy  were discussed with patient.    - The need for close monitoring of blood glucose, blood pressure, and serum lipids, avoiding cigarette or any type of tobacco, and the need for long term follow up was also discussed with patient.  - f/u in 1 year, sooner prn  3,4. Hypertensive retinopathy OU  - discussed importance of tight BP control  - monitor  5. Pseudophakia OU  - s/p CE/IOL OU -- pt unable to remember surgeon  - IOLs in good position  - monitor  6. PCO OU (OD > OS)  - dense PCO OU, likely cause of blurred vision OU  - recommend YAG cap OU, OD first  - pt wishes to return in 2 weeks for procedure   Ophthalmic Meds Ordered this visit:  No orders of the defined types were placed in this encounter.      Return in about 2 weeks (around 04/03/2019) for f/u PCO OD.  There are no Patient Instructions on file for this visit.   Explained the diagnoses, plan, and follow up with the patient and they expressed understanding.  Patient expressed understanding of the importance of proper follow up  care.   This document serves as a record of services personally performed by Gardiner Sleeper, MD, PhD. It was created on their behalf by Ernest Mallick, OA, an ophthalmic assistant. The creation of this record is the provider's dictation and/or activities during the visit.    Electronically signed by: Ernest Mallick, OA  09.02.2020 5:44 PM    Gardiner Sleeper, M.D., Ph.D. Diseases & Surgery of the Retina and Vitreous [facility]  I have reviewed the above documentation for accuracy and completeness, and I agree with the above. Gardiner Sleeper, M.D., Ph.D. 03/20/19 5:44 PM     Abbreviations: M myopia (nearsighted); A astigmatism; H hyperopia (farsighted); P presbyopia; Mrx spectacle prescription;  CTL contact lenses; OD right eye; OS left eye; OU both eyes  XT exotropia; ET esotropia; PEK punctate epithelial keratitis; PEE punctate epithelial  erosions; DES dry eye syndrome; MGD meibomian gland dysfunction; ATs artificial tears; PFAT's preservative free artificial tears; [place] nuclear sclerotic cataract; PSC posterior subcapsular cataract; ERM epi-retinal membrane; PVD posterior vitreous detachment; RD retinal detachment; DM diabetes mellitus; DR diabetic retinopathy; NPDR non-proliferative diabetic retinopathy; PDR proliferative diabetic retinopathy; CSME clinically significant macular edema; DME diabetic macular edema; dbh dot blot hemorrhages; CWS cotton wool spot; POAG primary open angle glaucoma; C/D cup-to-disc ratio; HVF humphrey visual field; GVF goldmann visual field; OCT optical coherence tomography; IOP intraocular pressure; BRVO Branch retinal vein occlusion; CRVO central retinal vein occlusion; CRAO central retinal artery occlusion; BRAO branch retinal artery occlusion; RT retinal tear; SB scleral buckle; PPV pars plana vitrectomy; VH Vitreous hemorrhage; PRP panretinal laser photocoagulation; IVK intravitreal kenalog; VMT vitreomacular traction; MH Macular hole;  NVD  neovascularization of the disc; NVE neovascularization elsewhere; AREDS age related eye disease study; ARMD age related macular degeneration; POAG primary open angle glaucoma; EBMD epithelial/anterior basement membrane dystrophy; ACIOL anterior chamber intraocular lens; IOL intraocular lens; PCIOL posterior chamber intraocular lens; Phaco/IOL phacoemulsification with intraocular lens placement; [place] photorefractive keratectomy; LASIK laser assisted in situ keratomileusis; HTN hypertension; DM diabetes mellitus; COPD chronic obstructive pulmonary disease

## 2019-03-23 ENCOUNTER — Other Ambulatory Visit: Payer: Self-pay | Admitting: Family Medicine

## 2019-03-27 ENCOUNTER — Other Ambulatory Visit: Payer: Self-pay | Admitting: Family Medicine

## 2019-04-02 NOTE — Progress Notes (Signed)
[facility] Note  04/03/2019     CHIEF COMPLAINT Patient presents for Retina Follow Up   HISTORY OF PRESENT ILLNESS: Michelle [name] is a 85 y.o. female who presents to the clinic today for:   HPI    Retina Follow Up    Patient presents with  Other.  In both eyes.  This started weeks ago.  Severity is moderate.  Duration of weeks.  Since onset it is stable.  I, the attending physician,  performed the HPI with the patient and updated documentation appropriately.          Comments    BS this morning: 131 Patient states her vision is about the same OU.  Patient denies eye pain or discomfort.  Patient denies any new or worsening floaters or fol.       Last edited by Bernarda Caffey, MD on 04/03/2019  9:51 AM. (History)    pt here for YAG cap OD today  Referring physician: [name], MD [place],  [place] [address]  HISTORICAL INFORMATION:   Selected notes from the MEDICAL RECORD NUMBER Referred by Dr. Yong Channel (PCP) for DM exam LEE:  Ocular Hx- PMH-   CURRENT MEDICATIONS: Current Outpatient Medications (Ophthalmic Drugs)  Medication Sig  . prednisoLONE acetate (PRED FORTE) 1 % ophthalmic suspension Place 1 drop into the right eye 4 (four) times daily for 7 days.   No current facility-administered medications for this visit.  (Ophthalmic Drugs)   Current Outpatient Medications (Other)  Medication Sig  . ACCU-CHEK FASTCLIX LANCETS MISC   . ACCU-CHEK SMARTVIEW test strip TEST BLOOD SUGAR THREE TIMES DAILY  . acetaminophen (TYLENOL) 325 MG tablet Take 650 mg by mouth every 6 (six) hours as needed for mild pain. Reported on 10/23/2015  . albuterol (PROVENTIL HFA;VENTOLIN HFA) 108 (90 Base) MCG/ACT inhaler Inhale 1 puff into the lungs every 4 (four) hours as needed for wheezing or shortness of breath.  Marland Kitchen albuterol (PROVENTIL) (2.5 MG/3ML) 0.083% nebulizer solution TAKE 3ML (2.5MG  TOTAL) BY NEBULIZATION EVERY 6 HOURS AS NEEDED FOR  WHEEZING OR FOR SHORTNESS OF BREATH  . Alcohol Swabs (B-D SINGLE USE SWABS REGULAR) PADS USE  FOR  TESTING THREE TIMES DAILY  . amLODipine (NORVASC) 10 MG tablet TAKE 1 TABLET EVERY DAY  . aspirin 81 MG tablet Take 81 mg by mouth daily.   . Blood Glucose Calibration (ACCU-CHEK SMARTVIEW CONTROL) LIQD   . carvedilol (COREG) 12.5 MG tablet TAKE 1 TABLET BY MOUTH TWICE DAILY WITH A MEAL  . clopidogrel (PLAVIX) 75 MG tablet TAKE ONE TABLET BY MOUTH DAILY  . enalapril (VASOTEC) 20 MG tablet TAKE 1 TABLET TWICE DAILY  . fenofibrate micronized (LOFIBRA) 134 MG capsule TAKE ONE CAPSULE BY MOUTH DAILY  . ferrous sulfate 325 (65 FE) MG tablet Take 1 tablet (325 mg total) by mouth 2 (two) times daily with a meal.  . fish oil-omega-3 fatty acids 1000 MG capsule Take 2 g by mouth daily.    . Fluticasone-Salmeterol (ADVAIR) 250-50 MCG/DOSE AEPB INHALE ONE PUFF BY MOUTH DAILY AS NEEDED  . folic acid (FOLVITE) A999333 MCG tablet Take 400 mcg by mouth daily.    . furosemide (LASIX) 40 MG tablet TAKE ONE TABLET BY MOUTH DAILY  . Insulin Glargine (LANTUS SOLOSTAR) 100 UNIT/ML Solostar Pen INJECT  17  TO  20 UNITS SUBCUTANEOUSLY EVERY MORNING  . Insulin Pen Needle (B-D ULTRAFINE III SHORT PEN) 31G X 8 MM MISC USE AS  DIRECTED WITH LANTUS SOLOSTAR  . KLOR-CON M20 20 MEQ tablet TAKE ONE TABLET BY MOUTH DAILY  . Melatonin 5 MG CAPS Take 1 capsule by mouth at bedtime.   . metFORMIN (GLUCOPHAGE) 1000 MG tablet TAKE 1 TABLET TWICE DAILY  . nitroGLYCERIN (NITROSTAT) 0.4 MG SL tablet PLACE 1 TABLET (0.4 MG TOTAL) UNDER THE TONGUE EVERY 5 (FIVE) MINUTES AS NEEDED FOR CHEST PAIN (CHEST PAIN OR SHORTNESS OF BREATH).  Marland Kitchen pantoprazole (PROTONIX) 40 MG tablet TAKE ONE TABLET BY MOUTH DAILY  . Potassium Chloride ER 20 MEQ TBCR TAKE ONE TABLET BY MOUTH DAILY  . rosuvastatin (CRESTOR) 40 MG tablet TAKE 1 TABLET EVERY DAY  . sertraline (ZOLOFT) 25 MG tablet TAKE 1 TABLET EVERY DAY  . sodium chloride (OCEAN) 0.65 % SOLN nasal spray  Place 1 spray into both nostrils as needed for congestion.  Marland Kitchen SPIRIVA HANDIHALER 18 MCG inhalation capsule INHALE THE ENTIRE CONTENTS OF 1 CAPSULE ONCE A DAY USING HANDIHALER DEVICE AT 2 PM  . traMADol (ULTRAM) 50 MG tablet TAKE ONE TABLET BY MOUTH THREE TIMES A DAY AS NEEDED (30 DAY SUPPLY)  . triamcinolone cream (KENALOG) 0.1 % Apply 1 application topically 2 (two) times daily.  . vitamin E 400 UNIT capsule Take 400 Units by mouth daily.     No current facility-administered medications for this visit.  (Other)      REVIEW OF SYSTEMS: ROS    Positive for: Endocrine, Cardiovascular, Eyes, Respiratory   Negative for: Constitutional, Gastrointestinal, Neurological, Skin, Genitourinary, Musculoskeletal, HENT, Psychiatric, Allergic/Imm, Heme/Lymph   Last edited by Doneen Poisson on 04/03/2019  9:11 AM. (History)       ALLERGIES Allergies  Allergen Reactions  . Codeine Sulfate Nausea Only  . Morphine Sulfate Nausea Only    PAST MEDICAL HISTORY Past Medical History:  Diagnosis Date  . Anginal pain ([place])   . Arthritis   . AV block, 1st degree   . CAD (coronary artery disease) 1990; 2015   Cardiac cath 1990 with Dr. Lia Foyer and [name] Residual CAD, non obstructive per 2015 cath. STEMI status post stent in August 2015.  Marland Kitchen COPD (chronic obstructive pulmonary disease) ([place])   . Diabetes mellitus type 2, insulin dependent ([place])   . Essential hypertension   . Hyperlipidemia with target LDL less than 70   . Myocardial infarction ([place])   . Pericarditis-post MI (short course of steroids) 03/06/2014  . 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 Shortness of breath    Past Surgical History:  Procedure Laterality Date  . CATARACT EXTRACTION  2010  .  CHOLECYSTECTOMY    . CORONARY ANGIOPLASTY WITH STENT PLACEMENT  02/18/14   Promus DES to RCA  . LEFT HEART CATHETERIZATION WITH CORONARY ANGIOGRAM N/A 02/18/2014   Procedure: LEFT HEART CATHETERIZATION WITH CORONARY ANGIOGRAM;  Surgeon: Jettie Booze, MD;  Location: [facility] CATH LAB;  Service: Cardiovascular;  Laterality: N/A;  . PTCA  1990   PTCA of RCA  . TRANSTHORACIC ECHOCARDIOGRAM  02/02/2012   mild LVH, EF 55-60%, Normal WM, Gr 1 DD; Mild MR    FAMILY HISTORY Family History  Problem Relation Age of Onset  . Heart attack Mother 90  . Cancer Father 13  . Cancer Maternal Grandfather   .  Cancer Paternal Grandmother   . Cancer Paternal Grandfather   . Diabetes Son   . Liver cancer Brother   . Bone cancer Brother   . Heart attack Son   . Hypertension Son   . Diabetes Son   . Hyperlipidemia Son     SOCIAL HISTORY Social History   Tobacco Use  . Smoking status: Former Smoker    Packs/day: 1.00    Years: 55.00    Pack years: 55.00    Types: Cigarettes    Quit date: 09/29/2006    Years since quitting: 12.5  . Smokeless tobacco: Never Used  Substance Use Topics  . Alcohol use: No  . Drug use: No         OPHTHALMIC EXAM:  Base Eye Exam    Visual Acuity (Snellen - Linear)      Right Left   Dist [place] 20/200 +1 20/40 +2   Dist ph [place] NI NI       Tonometry (Tonopen, 9:14 AM)      Right Left   Pressure 30 34       Tonometry #2 (Tonopen, 9:46 AM)      Right Left   Pressure 19 17       Tonometry Comments   Pt squeezing very hard.  Checked twice.       Pupils      Dark Light Shape React APD   Right 3 2 Round Brisk 0   Left 3 2 Round Brisk 0       Visual Fields      Left Right    Full Full       Extraocular Movement      Right Left    Full Full       Neuro/Psych    Oriented x3: Yes   Mood/Affect: Normal       Dilation    Both eyes: 1.0% Mydriacyl, 2.5% Phenylephrine @ 9:14 AM        Slit Lamp and Fundus Exam    Slit Lamp Exam      Right  Left   Lids/Lashes Dermatochalasis - lower lid, Meibomian gland dysfunction Dermatochalasis - lower lid, Meibomian gland dysfunction, Ptosis   Conjunctiva/Sclera White and quiet White and quiet   Cornea Arcus, trace Punctate epithelial erosions Arcus, trace Punctate epithelial erosions   Anterior Chamber Deep and quiet Deep and quiet   Iris Round and dilated, No NVI Round and dilated, No NVI   Lens PC IOL in good position, 3+ Posterior capsular opacification PC IOL in good position, 1-2+ Posterior capsular opacification   Vitreous Vitreous syneresis, Posterior vitreous detachment Vitreous syneresis, Posterior vitreous detachment       Fundus Exam      Right Left   Disc trace Pallor, mild temporal Peripapillary atrophy trace Pallor, mild temporal Peripapillary atrophy   C/D Ratio 0.4 0.4   Macula Flat, Blunted foveal reflex, RPE mottling and clumping, No heme or edema Flat, Blunted foveal reflex, RPE mottling and clumping, No heme or edema   Vessels Vascular attenuation, Tortuous Vascular attenuation, Tortuous   Periphery Attached , reticular degeneration, no heme Attached , reticular degeneration, no heme          IMAGING AND PROCEDURES  Imaging and Procedures for @TODAY @  OCT, Retina - OU - Both Eyes       Right Eye Quality was borderline. Central Foveal Thickness: 260. Progression has been stable. Findings include normal foveal contour, no SRF, no IRF.  Left Eye Quality was borderline. Central Foveal Thickness: 254. Progression has no prior data. Findings include normal foveal contour, no IRF, no SRF.   Notes *Images captured and stored on drive  Diagnosis / Impression:  NFP, no IRF/SRF OU No DME OU  Clinical management:  See below  Abbreviations: NFP - Normal foveal profile. CME - cystoid macular edema. PED - pigment epithelial detachment. IRF - intraretinal fluid. SRF - subretinal fluid. EZ - ellipsoid zone. ERM - epiretinal membrane. ORA - outer retinal atrophy. ORT  - outer retinal tubulation. SRHM - subretinal hyper-reflective material        Yag Capsulotomy - OD - Right Eye       Procedure note: YAG Capsulotomy, RIGHT Eye  Informed consent obtained. Pre-op dilating drops (1% Topicamide and 2.5% Phenylephrine), and topical anesthesia given. Power: 7.5 mJ Shots: 17 Posterior capsulotomy in cruciate formation performed without difficulty. Patient tolerated procedure well. No complications. Rx pred forte 4 times a day for 7 days, then stop. Pt received written and verbal post laser education. Recheck in 2 weeks w/ dilated exam.                 ASSESSMENT/PLAN:    ICD-10-CM   1. Diabetes mellitus type 2 without retinopathy ([place])  E11.9   2. Retinal edema  H35.81 OCT, Retina - OU - Both Eyes  3. Essential hypertension  I10   4. Hypertensive retinopathy of both eyes  H35.033   5. Pseudophakia of both eyes  Z96.1   6. PCO (posterior capsular opacification), bilateral  H26.493 Yag Capsulotomy - OD - Right Eye    1,2. Diabetes mellitus, type 2 without retinopathy  - The incidence, risk factors for progression, natural history and treatment options for diabetic retinopathy  were discussed with patient.    - The need for close monitoring of blood glucose, blood pressure, and serum lipids, avoiding cigarette or any type of tobacco, and the need for long term follow up was also discussed with patient.  - f/u in 1 year, sooner prn  3,4. Hypertensive retinopathy OU  - discussed importance of tight BP control  - monitor  5. Pseudophakia OU  - s/p CE/IOL OU -- pt unable to remember surgeon  - IOLs in good position  - monitor  6. PCO OU (OD > OS)  - dense PCO OU, likely cause of blurred vision OU  - recommend YAG cap OD today, 09.16.20  - pt wishes to proceed  - RBA of procedure discussed, questions answered  - informed consent obtained and signed  - see procedure note  - f/u 2 weeks for YAG cap OS    Ophthalmic Meds Ordered  this visit:  Meds ordered this encounter  Medications  . prednisoLONE acetate (PRED FORTE) 1 % ophthalmic suspension    Sig: Place 1 drop into the right eye 4 (four) times daily for 7 days.    Dispense:  10 mL    Refill:  0       Return in about 2 weeks (around 04/17/2019) for f/u YAG cap OS.  There are no Patient Instructions on file for this visit.   Explained the diagnoses, plan, and follow up with the patient and they expressed understanding.  Patient expressed understanding of the importance of proper follow up care.   This document serves as a record of services personally performed by Gardiner Sleeper, MD, PhD. It was created on their behalf by Ernest Mallick, OA, an ophthalmic assistant. The creation  of this record is the provider's dictation and/or activities during the visit.    Electronically signed by: Ernest Mallick, OA  09.15.2020 5:25 PM     Gardiner Sleeper, M.D., Ph.D. Diseases & Surgery of the Retina and Vitreous [facility]  I have reviewed the above documentation for accuracy and completeness, and I agree with the above. Gardiner Sleeper, M.D., Ph.D. 04/03/19 5:25 PM    Abbreviations: M myopia (nearsighted); A astigmatism; H hyperopia (farsighted); P presbyopia; Mrx spectacle prescription;  CTL contact lenses; OD right eye; OS left eye; OU both eyes  XT exotropia; ET esotropia; PEK punctate epithelial keratitis; PEE punctate epithelial erosions; DES dry eye syndrome; MGD meibomian gland dysfunction; ATs artificial tears; PFAT's preservative free artificial tears; [place] nuclear sclerotic cataract; PSC posterior subcapsular cataract; ERM epi-retinal membrane; PVD posterior vitreous detachment; RD retinal detachment; DM diabetes mellitus; DR diabetic retinopathy; NPDR non-proliferative diabetic retinopathy; PDR proliferative diabetic retinopathy; CSME clinically significant macular edema; DME diabetic macular edema; dbh dot blot hemorrhages; CWS cotton  wool spot; POAG primary open angle glaucoma; C/D cup-to-disc ratio; HVF humphrey visual field; GVF goldmann visual field; OCT optical coherence tomography; IOP intraocular pressure; BRVO Branch retinal vein occlusion; CRVO central retinal vein occlusion; CRAO central retinal artery occlusion; BRAO branch retinal artery occlusion; RT retinal tear; SB scleral buckle; PPV pars plana vitrectomy; VH Vitreous hemorrhage; PRP panretinal laser photocoagulation; IVK intravitreal kenalog; VMT vitreomacular traction; MH Macular hole;  NVD neovascularization of the disc; NVE neovascularization elsewhere; AREDS age related eye disease study; ARMD age related macular degeneration; POAG primary open angle glaucoma; EBMD epithelial/anterior basement membrane dystrophy; ACIOL anterior chamber intraocular lens; IOL intraocular lens; PCIOL posterior chamber intraocular lens; Phaco/IOL phacoemulsification with intraocular lens placement; [place] photorefractive keratectomy; LASIK laser assisted in situ keratomileusis; HTN hypertension; DM diabetes mellitus; COPD chronic obstructive pulmonary disease

## 2019-04-03 ENCOUNTER — Other Ambulatory Visit: Payer: Self-pay

## 2019-04-03 ENCOUNTER — Ambulatory Visit (INDEPENDENT_AMBULATORY_CARE_PROVIDER_SITE_OTHER): Payer: Medicare HMO | Admitting: Ophthalmology

## 2019-04-03 ENCOUNTER — Encounter (INDEPENDENT_AMBULATORY_CARE_PROVIDER_SITE_OTHER): Payer: Self-pay | Admitting: Ophthalmology

## 2019-04-03 DIAGNOSIS — Z961 Presence of intraocular lens: Secondary | ICD-10-CM

## 2019-04-03 DIAGNOSIS — H3581 Retinal edema: Secondary | ICD-10-CM | POA: Diagnosis not present

## 2019-04-03 DIAGNOSIS — H26493 Other secondary cataract, bilateral: Secondary | ICD-10-CM | POA: Diagnosis not present

## 2019-04-03 DIAGNOSIS — I10 Essential (primary) hypertension: Secondary | ICD-10-CM

## 2019-04-03 DIAGNOSIS — H35033 Hypertensive retinopathy, bilateral: Secondary | ICD-10-CM

## 2019-04-03 DIAGNOSIS — E119 Type 2 diabetes mellitus without complications: Secondary | ICD-10-CM

## 2019-04-03 MED ORDER — PREDNISOLONE ACETATE 1 % OP SUSP: 1.0000 [drp] | Freq: Four times a day (QID) | OPHTHALMIC | 0 refills | Status: AC

## 2019-04-08 ENCOUNTER — Other Ambulatory Visit: Payer: Self-pay | Admitting: Family Medicine

## 2019-04-16 NOTE — Progress Notes (Signed)
[facility] Note  04/17/2019     CHIEF COMPLAINT Patient presents for Retina Follow Up   HISTORY OF PRESENT ILLNESS: Michelle [name] is a 85 y.o. female who presents to the clinic today for:   HPI    Retina Follow Up    Patient presents with  Other.  In both eyes.  This started 2 weeks ago.  Severity is moderate.  I, the attending physician,  performed the HPI with the patient and updated documentation appropriately.          Comments    Patient here for 2 weeks retina follow up fr yag cap OS. Patient states vision in OD doing great. Was able to see TV. No eye pain.  Ready to have OS done.        Last edited by Bernarda Caffey, MD on 04/17/2019 11:00 AM. (History)    pt here for s/p yag cap OD--patient states vision is doing great OD. Able to see TV more clearly. Wants to have yag OS.  Referring physician: [name], MD [place],  [place] [id number]  HISTORICAL INFORMATION:   Selected notes from the MEDICAL RECORD NUMBER Referred by Dr. Yong Channel (PCP) for DM exam LEE:  Ocular Hx- PMH-   CURRENT MEDICATIONS: No current outpatient medications on file. (Ophthalmic Drugs)   No current facility-administered medications for this visit.  (Ophthalmic Drugs)   Current Outpatient Medications (Other)  Medication Sig  . ACCU-CHEK FASTCLIX LANCETS MISC   . ACCU-CHEK SMARTVIEW test strip TEST BLOOD SUGAR THREE TIMES DAILY  . acetaminophen (TYLENOL) 325 MG tablet Take 650 mg by mouth every 6 (six) hours as needed for mild pain. Reported on 10/23/2015  . albuterol (PROVENTIL HFA;VENTOLIN HFA) 108 (90 Base) MCG/ACT inhaler Inhale 1 puff into the lungs every 4 (four) hours as needed for wheezing or shortness of breath.  Marland Kitchen albuterol (PROVENTIL) (2.5 MG/3ML) 0.083% nebulizer solution TAKE 3ML (2.5MG  TOTAL) BY NEBULIZATION EVERY 6 HOURS AS NEEDED FOR WHEEZING OR FOR SHORTNESS OF BREATH  . Alcohol Swabs (B-D SINGLE USE SWABS REGULAR) PADS USE   FOR  TESTING THREE TIMES DAILY  . amLODipine (NORVASC) 10 MG tablet TAKE 1 TABLET EVERY DAY  . aspirin 81 MG tablet Take 81 mg by mouth daily.   . Blood Glucose Calibration (ACCU-CHEK SMARTVIEW CONTROL) LIQD   . carvedilol (COREG) 12.5 MG tablet TAKE 1 TABLET BY MOUTH TWICE DAILY WITH A MEAL  . clopidogrel (PLAVIX) 75 MG tablet TAKE ONE TABLET BY MOUTH DAILY  . enalapril (VASOTEC) 20 MG tablet TAKE 1 TABLET TWICE DAILY  . fenofibrate micronized (LOFIBRA) 134 MG capsule TAKE ONE CAPSULE BY MOUTH DAILY  . ferrous sulfate 325 (65 FE) MG tablet Take 1 tablet (325 mg total) by mouth 2 (two) times daily with a meal.  . fish oil-omega-3 fatty acids 1000 MG capsule Take 2 g by mouth daily.    . Fluticasone-Salmeterol (ADVAIR) 250-50 MCG/DOSE AEPB INHALE ONE PUFF BY MOUTH DAILY AS NEEDED  . folic acid (FOLVITE) A999333 MCG tablet Take 400 mcg by mouth daily.    . furosemide (LASIX) 40 MG tablet TAKE ONE TABLET BY MOUTH DAILY  . Insulin Glargine (LANTUS SOLOSTAR) 100 UNIT/ML Solostar Pen INJECT  17  TO  20 UNITS SUBCUTANEOUSLY EVERY MORNING  . Insulin Pen Needle (B-D ULTRAFINE III SHORT PEN) 31G X 8 MM MISC USE AS DIRECTED WITH LANTUS SOLOSTAR  . KLOR-CON M20 20 MEQ tablet TAKE ONE  TABLET BY MOUTH DAILY  . Melatonin 5 MG CAPS Take 1 capsule by mouth at bedtime.   . metFORMIN (GLUCOPHAGE) 1000 MG tablet TAKE 1 TABLET TWICE DAILY  . nitroGLYCERIN (NITROSTAT) 0.4 MG SL tablet PLACE 1 TABLET (0.4 MG TOTAL) UNDER THE TONGUE EVERY 5 (FIVE) MINUTES AS NEEDED FOR CHEST PAIN (CHEST PAIN OR SHORTNESS OF BREATH).  Marland Kitchen pantoprazole (PROTONIX) 40 MG tablet TAKE ONE TABLET BY MOUTH DAILY  . Potassium Chloride ER 20 MEQ TBCR TAKE ONE TABLET BY MOUTH DAILY  . rosuvastatin (CRESTOR) 40 MG tablet TAKE 1 TABLET EVERY DAY  . sertraline (ZOLOFT) 25 MG tablet TAKE 1 TABLET EVERY DAY  . sodium chloride (OCEAN) 0.65 % SOLN nasal spray Place 1 spray into both nostrils as needed for congestion.  Marland Kitchen SPIRIVA HANDIHALER 18 MCG inhalation  capsule INHALE THE ENTIRE CONTENTS OF 1 CAPSULE ONCE A DAY USING HANDIHALER DEVICE AT 2 PM  . traMADol (ULTRAM) 50 MG tablet TAKE ONE TABLET BY MOUTH THREE TIMES A DAY AS NEEDED (30 DAY SUPPLY)  . triamcinolone cream (KENALOG) 0.1 % Apply 1 application topically 2 (two) times daily.  . vitamin E 400 UNIT capsule Take 400 Units by mouth daily.     No current facility-administered medications for this visit.  (Other)      REVIEW OF SYSTEMS: ROS    Positive for: Endocrine, Cardiovascular, Eyes, Respiratory   Negative for: Constitutional, Gastrointestinal, Neurological, Skin, Genitourinary, Musculoskeletal, HENT, Psychiatric, Allergic/Imm, Heme/Lymph   Last edited by Theodore Demark, COA on 04/17/2019  9:35 AM. (History)       ALLERGIES Allergies  Allergen Reactions  . Codeine Sulfate Nausea Only  . Morphine Sulfate Nausea Only    PAST MEDICAL HISTORY Past Medical History:  Diagnosis Date  . Anginal pain ([place])   . Arthritis   . AV block, 1st degree   . CAD (coronary artery disease) 1990; 2015   Cardiac cath 1990 with Dr. Lia Foyer and [name] Residual CAD, non obstructive per 2015 cath. STEMI status post stent in August 2015.  Marland Kitchen COPD (chronic obstructive pulmonary disease) ([place])   . Diabetes mellitus type 2, insulin dependent ([place])   . Essential hypertension   . Hyperlipidemia with target LDL less than 70   . Myocardial infarction ([place])   . Pericarditis-post MI (short course of steroids) 03/06/2014  . 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 Shortness of breath    Past Surgical History:  Procedure Laterality Date  . CATARACT EXTRACTION  2010  . CHOLECYSTECTOMY    . CORONARY ANGIOPLASTY WITH STENT PLACEMENT  02/18/14   Promus DES to RCA  . LEFT HEART  CATHETERIZATION WITH CORONARY ANGIOGRAM N/A 02/18/2014   Procedure: LEFT HEART CATHETERIZATION WITH CORONARY ANGIOGRAM;  Surgeon: Jettie Booze, MD;  Location: [facility] CATH LAB;  Service: Cardiovascular;  Laterality: N/A;  . PTCA  1990   PTCA of RCA  . TRANSTHORACIC ECHOCARDIOGRAM  02/02/2012   mild LVH, EF 55-60%, Normal WM, Gr 1 DD; Mild MR    FAMILY HISTORY Family History  Problem Relation Age of Onset  . Heart attack Mother 44  . Cancer Father 33  . Cancer Maternal Grandfather   . Cancer Paternal Grandmother   . Cancer Paternal Grandfather   .  Diabetes Son   . Liver cancer Brother   . Bone cancer Brother   . Heart attack Son   . Hypertension Son   . Diabetes Son   . Hyperlipidemia Son     SOCIAL HISTORY Social History   Tobacco Use  . Smoking status: Former Smoker    Packs/day: 1.00    Years: 55.00    Pack years: 55.00    Types: Cigarettes    Quit date: 09/29/2006    Years since quitting: 12.5  . Smokeless tobacco: Never Used  Substance Use Topics  . Alcohol use: No  . Drug use: No         OPHTHALMIC EXAM:  Base Eye Exam    Visual Acuity (Snellen - Linear)      Right Left   Dist [place] 20/30 -2 20/40   Dist ph [place] 20/25 -1 20/30       Tonometry (Tonopen, 9:31 AM)      Right Left   Pressure 19 12       Pupils      Dark Light Shape React APD   Right 3 2 Round Brisk None   Left 3 2 Round Brisk None       Visual Fields (Counting fingers)      Left Right    Full Full       Extraocular Movement      Right Left    Full, Ortho Full, Ortho       Neuro/Psych    Oriented x3: Yes   Mood/Affect: Normal       Dilation    Both eyes: 1.0% Mydriacyl, 2.5% Phenylephrine @ 9:31 AM        Slit Lamp and Fundus Exam    Slit Lamp Exam      Right Left   Lids/Lashes Dermatochalasis - lower lid, Meibomian gland dysfunction, mild ptosis Dermatochalasis - lower lid, Meibomian gland dysfunction, mild Ptosis   Conjunctiva/Sclera White and quiet White and quiet    Cornea Arcus, trace Punctate epithelial erosions Arcus, trace Punctate epithelial erosions   Anterior Chamber Deep and quiet Deep and quiet   Iris Round and dilated, No NVI Round and dilated, No NVI   Lens PC IOL in good position, open Posterior capsular opacification PC IOL in good position, 2+ Posterior capsular opacification   Vitreous Vitreous syneresis, Posterior vitreous detachment Vitreous syneresis, Posterior vitreous detachment       Fundus Exam      Right Left   Disc trace Pallor, mild temporal Peripapillary atrophy trace Pallor, mild temporal Peripapillary atrophy   C/D Ratio 0.4 0.4   Macula Flat, Blunted foveal reflex, RPE mottling and clumping, No heme or edema Flat, Blunted foveal reflex, RPE mottling and clumping, No heme or edema   Vessels Vascular attenuation, Tortuous Vascular attenuation, Tortuous   Periphery Attached , reticular degeneration, no heme, no RT/RD Attached , reticular degeneration, no heme, no RT/RD          IMAGING AND PROCEDURES  Imaging and Procedures for @TODAY @  OCT, Retina - OU - Both Eyes       Right Eye Quality was good. Central Foveal Thickness: 259. Progression has been stable. Findings include normal foveal contour, no SRF, no IRF (stable).   Left Eye Quality was borderline. Central Foveal Thickness: 251. Progression has been stable. Findings include normal foveal contour, no IRF, no SRF (stable).   Notes *Images captured and stored on drive  Diagnosis / Impression:  NFP, no IRF/SRF  OU--stable from prior No DME OU  Clinical management:  See below  Abbreviations: NFP - Normal foveal profile. CME - cystoid macular edema. PED - pigment epithelial detachment. IRF - intraretinal fluid. SRF - subretinal fluid. EZ - ellipsoid zone. ERM - epiretinal membrane. ORA - outer retinal atrophy. ORT - outer retinal tubulation. SRHM - subretinal hyper-reflective material        Yag Capsulotomy - OS - Left Eye       Procedure note: YAG  Capsulotomy, LEFT Eye  Informed consent obtained. Pre-op dilating drops (1% Topicamide and 2.5% Phenylephrine), and topical anesthesia given. Power: 5.7 mJ Shots: 31 Posterior capsulotomy in cruciate formation performed without difficulty. Patient tolerated procedure well. No complications. Rx pred forte 4 times a day for 7 days, then stop. Pt received written and verbal post laser education. Recheck in 2-3 weeks w/ dilated exam.                 ASSESSMENT/PLAN:    ICD-10-CM   1. Diabetes mellitus type 2 without retinopathy ([place])  E11.9   2. Retinal edema  H35.81 OCT, Retina - OU - Both Eyes  3. Essential hypertension  I10   4. Hypertensive retinopathy of both eyes  H35.033   5. Pseudophakia of both eyes  Z96.1   6. PCO (posterior capsular opacification), bilateral  H26.493 Yag Capsulotomy - OS - Left Eye    1,2. Diabetes mellitus, type 2 without retinopathy  - The incidence, risk factors for progression, natural history and treatment options for diabetic retinopathy  were discussed with patient.    - The need for close monitoring of blood glucose, blood pressure, and serum lipids, avoiding cigarette or any type of tobacco, and the need for long term follow up was also discussed with patient.  - f/u in 1 year, sooner prn  3,4. Hypertensive retinopathy OU  - discussed importance of tight BP control  - monitor  5. Pseudophakia OU  - s/p CE/IOL OU -- pt unable to remember surgeon  - IOLs in good position  - monitor  6. PCO OU (OD > OS)  - dense PCO OU, likely cause of blurred vision OU  - s/p YAG cap OD (09.16.20), BCVA OD 20/25 today!, improved from 20/200  - recommend YAG cap OS today, 09.30.30, BCVA OS 20/30 today  - pt wishes to proceed  - RBA of procedure discussed, questions answered  - informed consent obtained and signed  - see procedure note  - start PF QID OS x7d  - f/u 2-3 wks for POV    Ophthalmic Meds Ordered this visit:  No orders of the defined  types were placed in this encounter.      Return for 2-3 wks, POV s/p YAG Cap OU.  There are no Patient Instructions on file for this visit.   Explained the diagnoses, plan, and follow up with the patient and they expressed understanding.  Patient expressed understanding of the importance of proper follow up care.   This document serves as a record of services personally performed by Gardiner Sleeper, MD, PhD. It was created on their behalf by Ernest Mallick, OA, an ophthalmic assistant. The creation of this record is the provider's dictation and/or activities during the visit.    Electronically signed by: Ernest Mallick, OA  09.29.2020 11:33 AM     Gardiner Sleeper, M.D., Ph.D. Diseases & Surgery of the Retina and Vitreous [facility]  I have reviewed the above documentation  for accuracy and completeness, and I agree with the above. Gardiner Sleeper, M.D., Ph.D. 04/17/19 11:33 AM    Abbreviations: M myopia (nearsighted); A astigmatism; H hyperopia (farsighted); P presbyopia; Mrx spectacle prescription;  CTL contact lenses; OD right eye; OS left eye; OU both eyes  XT exotropia; ET esotropia; PEK punctate epithelial keratitis; PEE punctate epithelial erosions; DES dry eye syndrome; MGD meibomian gland dysfunction; ATs artificial tears; PFAT's preservative free artificial tears; [place] nuclear sclerotic cataract; PSC posterior subcapsular cataract; ERM epi-retinal membrane; PVD posterior vitreous detachment; RD retinal detachment; DM diabetes mellitus; DR diabetic retinopathy; NPDR non-proliferative diabetic retinopathy; PDR proliferative diabetic retinopathy; CSME clinically significant macular edema; DME diabetic macular edema; dbh dot blot hemorrhages; CWS cotton wool spot; POAG primary open angle glaucoma; C/D cup-to-disc ratio; HVF humphrey visual field; GVF goldmann visual field; OCT optical coherence tomography; IOP intraocular pressure; BRVO Branch retinal vein  occlusion; CRVO central retinal vein occlusion; CRAO central retinal artery occlusion; BRAO branch retinal artery occlusion; RT retinal tear; SB scleral buckle; PPV pars plana vitrectomy; VH Vitreous hemorrhage; PRP panretinal laser photocoagulation; IVK intravitreal kenalog; VMT vitreomacular traction; MH Macular hole;  NVD neovascularization of the disc; NVE neovascularization elsewhere; AREDS age related eye disease study; ARMD age related macular degeneration; POAG primary open angle glaucoma; EBMD epithelial/anterior basement membrane dystrophy; ACIOL anterior chamber intraocular lens; IOL intraocular lens; PCIOL posterior chamber intraocular lens; Phaco/IOL phacoemulsification with intraocular lens placement; [place] photorefractive keratectomy; LASIK laser assisted in situ keratomileusis; HTN hypertension; DM diabetes mellitus; COPD chronic obstructive pulmonary disease

## 2019-04-17 ENCOUNTER — Other Ambulatory Visit: Payer: Self-pay

## 2019-04-17 ENCOUNTER — Ambulatory Visit (INDEPENDENT_AMBULATORY_CARE_PROVIDER_SITE_OTHER): Payer: Medicare HMO | Admitting: Ophthalmology

## 2019-04-17 ENCOUNTER — Encounter (INDEPENDENT_AMBULATORY_CARE_PROVIDER_SITE_OTHER): Payer: Self-pay | Admitting: Ophthalmology

## 2019-04-17 DIAGNOSIS — H3581 Retinal edema: Secondary | ICD-10-CM

## 2019-04-17 DIAGNOSIS — I10 Essential (primary) hypertension: Secondary | ICD-10-CM

## 2019-04-17 DIAGNOSIS — H26493 Other secondary cataract, bilateral: Secondary | ICD-10-CM | POA: Diagnosis not present

## 2019-04-17 DIAGNOSIS — Z961 Presence of intraocular lens: Secondary | ICD-10-CM

## 2019-04-17 DIAGNOSIS — H35033 Hypertensive retinopathy, bilateral: Secondary | ICD-10-CM

## 2019-04-17 DIAGNOSIS — E119 Type 2 diabetes mellitus without complications: Secondary | ICD-10-CM

## 2019-04-25 ENCOUNTER — Telehealth: Payer: Self-pay | Admitting: Family Medicine

## 2019-04-25 NOTE — Telephone Encounter (Signed)
I called the patient to schedule AWV with Courtney, but there was no answer and no option to leave a message. VDM (Dee-Dee) °

## 2019-04-29 ENCOUNTER — Other Ambulatory Visit: Payer: Self-pay | Admitting: Family Medicine

## 2019-04-29 NOTE — Progress Notes (Signed)
[facility] Note  05/03/2019     CHIEF COMPLAINT Patient presents for Retina Follow Up   HISTORY OF PRESENT ILLNESS: Michelle [name] is a 85 y.o. female who presents to the clinic today for:   HPI    Retina Follow Up    Patient presents with  Other.  In left eye.  This started 2.  Severity is moderate.  I, the attending physician,  performed the HPI with the patient and updated documentation appropriately.          Comments    Patient here for 2 weeks retina follow up for s/p yag OS. Patient states vision much better.  no eye pain.       Last edited by Bernarda Caffey, MD on 05/03/2019 10:26 AM. (History)    pt here   Referring physician: [name], MD [place],  [place] [address]  HISTORICAL INFORMATION:   Selected notes from the MEDICAL RECORD NUMBER Referred by Dr. Yong Channel (PCP) for DM exam LEE:  Ocular Hx- PMH-   CURRENT MEDICATIONS: No current outpatient medications on file. (Ophthalmic Drugs)   No current facility-administered medications for this visit.  (Ophthalmic Drugs)   Current Outpatient Medications (Other)  Medication Sig  . ACCU-CHEK FASTCLIX LANCETS MISC   . ACCU-CHEK SMARTVIEW test strip TEST BLOOD SUGAR THREE TIMES DAILY  . acetaminophen (TYLENOL) 325 MG tablet Take 650 mg by mouth every 6 (six) hours as needed for mild pain. Reported on 10/23/2015  . albuterol (PROVENTIL HFA;VENTOLIN HFA) 108 (90 Base) MCG/ACT inhaler Inhale 1 puff into the lungs every 4 (four) hours as needed for wheezing or shortness of breath.  Marland Kitchen albuterol (PROVENTIL) (2.5 MG/3ML) 0.083% nebulizer solution TAKE 3ML (2.5MG  TOTAL) BY NEBULIZATION EVERY 6 HOURS AS NEEDED FOR WHEEZING OR FOR SHORTNESS OF BREATH  . Alcohol Swabs (B-D SINGLE USE SWABS REGULAR) PADS USE  FOR  TESTING THREE TIMES DAILY  . amLODipine (NORVASC) 10 MG tablet TAKE 1 TABLET EVERY DAY  . aspirin 81 MG tablet Take 81 mg by mouth daily.   . Blood Glucose  Calibration (ACCU-CHEK SMARTVIEW CONTROL) LIQD   . carvedilol (COREG) 12.5 MG tablet TAKE 1 TABLET BY MOUTH TWICE DAILY WITH A MEAL  . clopidogrel (PLAVIX) 75 MG tablet TAKE ONE TABLET BY MOUTH DAILY  . enalapril (VASOTEC) 20 MG tablet TAKE 1 TABLET TWICE DAILY  . fenofibrate micronized (LOFIBRA) 134 MG capsule TAKE ONE CAPSULE BY MOUTH DAILY  . ferrous sulfate 325 (65 FE) MG tablet Take 1 tablet (325 mg total) by mouth 2 (two) times daily with a meal.  . fish oil-omega-3 fatty acids 1000 MG capsule Take 2 g by mouth daily.    . Fluticasone-Salmeterol (ADVAIR) 250-50 MCG/DOSE AEPB INHALE ONE PUFF BY MOUTH DAILY AS NEEDED  . folic acid (FOLVITE) A999333 MCG tablet Take 400 mcg by mouth daily.    . furosemide (LASIX) 40 MG tablet TAKE ONE TABLET BY MOUTH DAILY  . Insulin Glargine (LANTUS SOLOSTAR) 100 UNIT/ML Solostar Pen INJECT  17  TO  20 UNITS SUBCUTANEOUSLY EVERY MORNING  . Insulin Pen Needle (B-D ULTRAFINE III SHORT PEN) 31G X 8 MM MISC USE AS DIRECTED WITH LANTUS SOLOSTAR  . KLOR-CON M20 20 MEQ tablet TAKE ONE TABLET BY MOUTH DAILY  . Melatonin 5 MG CAPS Take 1 capsule by mouth at bedtime.   . metFORMIN (GLUCOPHAGE) 1000 MG tablet TAKE 1 TABLET TWICE DAILY  . nitroGLYCERIN (NITROSTAT) 0.4 MG  SL tablet PLACE 1 TABLET (0.4 MG TOTAL) UNDER THE TONGUE EVERY 5 (FIVE) MINUTES AS NEEDED FOR CHEST PAIN (CHEST PAIN OR SHORTNESS OF BREATH).  Marland Kitchen pantoprazole (PROTONIX) 40 MG tablet TAKE ONE TABLET BY MOUTH DAILY  . Potassium Chloride ER 20 MEQ TBCR TAKE ONE TABLET BY MOUTH DAILY  . rosuvastatin (CRESTOR) 40 MG tablet TAKE 1 TABLET EVERY DAY  . sertraline (ZOLOFT) 25 MG tablet TAKE 1 TABLET EVERY DAY  . sodium chloride (OCEAN) 0.65 % SOLN nasal spray Place 1 spray into both nostrils as needed for congestion.  Marland Kitchen SPIRIVA HANDIHALER 18 MCG inhalation capsule INHALE THE ENTIRE CONTENTS OF 1 CAPSULE ONCE A DAY USING HANDIHALER DEVICE AT 2 PM  . traMADol (ULTRAM) 50 MG tablet TAKE ONE TABLET BY MOUTH THREE TIMES  A DAY AS NEEDED (30 DAY SUPPLY)  . triamcinolone cream (KENALOG) 0.1 % Apply 1 application topically 2 (two) times daily.  . vitamin E 400 UNIT capsule Take 400 Units by mouth daily.     No current facility-administered medications for this visit.  (Other)      REVIEW OF SYSTEMS: ROS    Positive for: Endocrine, Cardiovascular, Eyes, Respiratory   Negative for: Constitutional, Gastrointestinal, Neurological, Skin, Genitourinary, Musculoskeletal, HENT, Psychiatric, Allergic/Imm, Heme/Lymph   Last edited by Theodore Demark, COA on 05/03/2019  9:26 AM. (History)       ALLERGIES Allergies  Allergen Reactions  . Codeine Sulfate Nausea Only  . Morphine Sulfate Nausea Only    PAST MEDICAL HISTORY Past Medical History:  Diagnosis Date  . Anginal pain ([place])   . Arthritis   . AV block, 1st degree   . CAD (coronary artery disease) 1990; 2015   Cardiac cath 1990 with Dr. Lia Foyer and [name] Residual CAD, non obstructive per 2015 cath. STEMI status post stent in August 2015.  Marland Kitchen COPD (chronic obstructive pulmonary disease) ([place])   . Diabetes mellitus type 2, insulin dependent ([place])   . Essential hypertension   . Hyperlipidemia with target LDL less than 70   . Myocardial infarction ([place])   . Pericarditis-post MI (short course of steroids) 03/06/2014  . 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 Shortness of breath    Past Surgical History:  Procedure Laterality Date  . CATARACT EXTRACTION  2010  . CHOLECYSTECTOMY    . CORONARY ANGIOPLASTY WITH STENT PLACEMENT  02/18/14   Promus DES to RCA  . LEFT HEART CATHETERIZATION WITH CORONARY ANGIOGRAM N/A 02/18/2014   Procedure: LEFT HEART CATHETERIZATION WITH CORONARY ANGIOGRAM;  Surgeon: Jettie Booze, MD;   Location[facility] LAB;  Service: Cardiovascular;  Laterality: N/A;  . PTCA  1990   PTCA of RCA  . TRANSTHORACIC ECHOCARDIOGRAM  02/02/2012   mild LVH, EF 55-60%, Normal WM, Gr 1 DD; Mild MR    FAMILY HISTORY Family History  Problem Relation Age of Onset  . Heart attack Mother 58  . Cancer Father 15  . Cancer Maternal Grandfather   . Cancer Paternal Grandmother   . Cancer Paternal Grandfather   . Diabetes Son   . Liver cancer Brother   . Bone cancer Brother   . Heart attack Son   . Hypertension Son   . Diabetes Son   . Hyperlipidemia Son  SOCIAL HISTORY Social History   Tobacco Use  . Smoking status: Former Smoker    Packs/day: 1.00    Years: 55.00    Pack years: 55.00    Types: Cigarettes    Quit date: 09/29/2006    Years since quitting: 12.6  . Smokeless tobacco: Never Used  Substance Use Topics  . Alcohol use: No  . Drug use: No         OPHTHALMIC EXAM:  Base Eye Exam    Visual Acuity (Snellen - Linear)      Right Left   Dist [place] 20/30 20/30   Dist ph [place] NI 20/25 -2       Tonometry (Tonopen, 9:22 AM)      Right Left   Pressure 16 17       Pupils      Dark Light Shape React APD   Right 3 2 Round Brisk None   Left 3 2 Round Brisk None       Visual Fields (Counting fingers)      Left Right    Full Full       Extraocular Movement      Right Left    Full, Ortho Full, Ortho       Neuro/Psych    Oriented x3: Yes   Mood/Affect: Normal       Dilation    Both eyes: 1.0% Mydriacyl, 2.5% Phenylephrine @ 9:22 AM        Slit Lamp and Fundus Exam    Slit Lamp Exam      Right Left   Lids/Lashes Dermatochalasis - lower lid, Meibomian gland dysfunction, mild ptosis Dermatochalasis - lower lid, Meibomian gland dysfunction, mild Ptosis   Conjunctiva/Sclera White and quiet White and quiet   Cornea Arcus, trace Punctate epithelial erosions Arcus, trace Punctate epithelial erosions   Anterior Chamber Deep and quiet, no cell or flare Deep and quiet,  no cell or flare   Iris Round and dilated, No NVI Round and dilated, No NVI   Lens PC IOL in good position, open Posterior capsular opacification PC IOL in good position, open PC   Vitreous Vitreous syneresis, Posterior vitreous detachment Vitreous syneresis, Posterior vitreous detachment       Fundus Exam      Right Left   Disc trace Pallor, mild temporal Peripapillary atrophy trace Pallor, mild temporal Peripapillary atrophy   C/D Ratio 0.4 0.4   Macula Flat, Blunted foveal reflex, RPE mottling and clumping, No heme or edema Flat, Blunted foveal reflex, RPE mottling and clumping, No heme or edema   Vessels Vascular attenuation, Tortuous Vascular attenuation, Tortuous   Periphery Attached , reticular degeneration, no heme, no RT/RD Attached , reticular degeneration, no heme, no RT/RD          IMAGING AND PROCEDURES  Imaging and Procedures for @TODAY @  OCT, Retina - OU - Both Eyes       Right Eye Quality was good. Central Foveal Thickness: 262. Progression has been stable. Findings include normal foveal contour, no SRF, no IRF (stable).   Left Eye Quality was borderline. Central Foveal Thickness: 256. Progression has been stable. Findings include normal foveal contour, no IRF, no SRF (stable).   Notes *Images captured and stored on drive  Diagnosis / Impression:  NFP, no IRF/SRF OU--stable from prior No DME OU  Clinical management:  See below  Abbreviations: NFP - Normal foveal profile. CME - cystoid macular edema. PED - pigment epithelial detachment. IRF - intraretinal fluid. SRF -  subretinal fluid. EZ - ellipsoid zone. ERM - epiretinal membrane. ORA - outer retinal atrophy. ORT - outer retinal tubulation. SRHM - subretinal hyper-reflective material                 ASSESSMENT/PLAN:    ICD-10-CM   1. Diabetes mellitus type 2 without retinopathy ([place])  E11.9   2. Retinal edema  H35.81 OCT, Retina - OU - Both Eyes  3. Essential hypertension  I10   4.  Hypertensive retinopathy of both eyes  H35.033   5. Pseudophakia of both eyes  Z96.1   6. PCO (posterior capsular opacification), bilateral  H26.493     1,2. Diabetes mellitus, type 2 without retinopathy  - The incidence, risk factors for progression, natural history and treatment options for diabetic retinopathy  were discussed with patient.    - The need for close monitoring of blood glucose, blood pressure, and serum lipids, avoiding cigarette or any type of tobacco, and the need for long term follow up was also discussed with patient.  - f/u in 1 year, sooner prn  3,4. Hypertensive retinopathy OU  - discussed importance of tight BP control  - monitor  5. Pseudophakia OU  - s/p CE/IOL OU -- pt unable to remember surgeon  - IOLs in good position  - monitor  6. PCO OU (OD > OS)  - dense PCO OU, likely cause of blurred vision OU  - s/p YAG cap OD (09.16.20), BCVA OD 20/30 improved from 20/200  - S/P YAG cap OS (09.30.30), BCVA OS 20/25 improved from 20/40  - pt very happy post procedures     Ophthalmic Meds Ordered this visit:  No orders of the defined types were placed in this encounter.      Return in about 1 year (around 05/02/2020) for DFE, OCT.  There are no Patient Instructions on file for this visit.   Explained the diagnoses, plan, and follow up with the patient and they expressed understanding.  Patient expressed understanding of the importance of proper follow up care.   This document serves as a record of services personally performed by Gardiner Sleeper, MD, PhD. It was created on their behalf by Roselee Nova, COMT. The creation of this record is the provider's dictation and/or activities during the visit.  Electronically signed by: Roselee Nova, COMT 05/04/19 1:28 AM   This document serves as a record of services personally performed by Gardiner Sleeper, MD, PhD. It was created on their behalf by Ernest Mallick, OA, an ophthalmic assistant. The creation of this  record is the provider's dictation and/or activities during the visit.    Electronically signed by: Ernest Mallick, OA 10.16.2020 1:28 AM   Gardiner Sleeper, M.D., Ph.D. Diseases & Surgery of the Retina and Vitreous [facility] 05/04/19  I have reviewed the above documentation for accuracy and completeness, and I agree with the above. Gardiner Sleeper, M.D., Ph.D. 05/04/19 1:28 AM  Abbreviations: M myopia (nearsighted); A astigmatism; H hyperopia (farsighted); P presbyopia; Mrx spectacle prescription;  CTL contact lenses; OD right eye; OS left eye; OU both eyes  XT exotropia; ET esotropia; PEK punctate epithelial keratitis; PEE punctate epithelial erosions; DES dry eye syndrome; MGD meibomian gland dysfunction; ATs artificial tears; PFAT's preservative free artificial tears; [place] nuclear sclerotic cataract; PSC posterior subcapsular cataract; ERM epi-retinal membrane; PVD posterior vitreous detachment; RD retinal detachment; DM diabetes mellitus; DR diabetic retinopathy; NPDR non-proliferative diabetic retinopathy; PDR proliferative diabetic retinopathy; CSME clinically significant  macular edema; DME diabetic macular edema; dbh dot blot hemorrhages; CWS cotton wool spot; POAG primary open angle glaucoma; C/D cup-to-disc ratio; HVF humphrey visual field; GVF goldmann visual field; OCT optical coherence tomography; IOP intraocular pressure; BRVO Branch retinal vein occlusion; CRVO central retinal vein occlusion; CRAO central retinal artery occlusion; BRAO branch retinal artery occlusion; RT retinal tear; SB scleral buckle; PPV pars plana vitrectomy; VH Vitreous hemorrhage; PRP panretinal laser photocoagulation; IVK intravitreal kenalog; VMT vitreomacular traction; MH Macular hole;  NVD neovascularization of the disc; NVE neovascularization elsewhere; AREDS age related eye disease study; ARMD age related macular degeneration; POAG primary open angle glaucoma; EBMD epithelial/anterior  basement membrane dystrophy; ACIOL anterior chamber intraocular lens; IOL intraocular lens; PCIOL posterior chamber intraocular lens; Phaco/IOL phacoemulsification with intraocular lens placement; [place] photorefractive keratectomy; LASIK laser assisted in situ keratomileusis; HTN hypertension; DM diabetes mellitus; COPD chronic obstructive pulmonary disease

## 2019-05-03 ENCOUNTER — Encounter (INDEPENDENT_AMBULATORY_CARE_PROVIDER_SITE_OTHER): Payer: Self-pay | Admitting: Ophthalmology

## 2019-05-03 ENCOUNTER — Ambulatory Visit (INDEPENDENT_AMBULATORY_CARE_PROVIDER_SITE_OTHER): Payer: Medicare HMO | Admitting: Ophthalmology

## 2019-05-03 DIAGNOSIS — H3581 Retinal edema: Secondary | ICD-10-CM

## 2019-05-03 DIAGNOSIS — H26493 Other secondary cataract, bilateral: Secondary | ICD-10-CM

## 2019-05-03 DIAGNOSIS — E119 Type 2 diabetes mellitus without complications: Secondary | ICD-10-CM

## 2019-05-03 DIAGNOSIS — Z961 Presence of intraocular lens: Secondary | ICD-10-CM

## 2019-05-03 DIAGNOSIS — H35033 Hypertensive retinopathy, bilateral: Secondary | ICD-10-CM

## 2019-05-03 DIAGNOSIS — I10 Essential (primary) hypertension: Secondary | ICD-10-CM

## 2019-05-06 ENCOUNTER — Other Ambulatory Visit: Payer: Self-pay | Admitting: Cardiovascular Disease

## 2019-05-06 DIAGNOSIS — I2510 Atherosclerotic heart disease of native coronary artery without angina pectoris: Secondary | ICD-10-CM

## 2019-05-20 ENCOUNTER — Other Ambulatory Visit: Payer: Self-pay

## 2019-05-20 ENCOUNTER — Ambulatory Visit (INDEPENDENT_AMBULATORY_CARE_PROVIDER_SITE_OTHER): Payer: Medicare HMO

## 2019-05-20 DIAGNOSIS — Z0000 Encounter for general adult medical examination without abnormal findings: Secondary | ICD-10-CM

## 2019-05-20 NOTE — Patient Instructions (Addendum)
Michelle [name] , Thank you for taking time to come for your Medicare Wellness Visit. I appreciate your ongoing commitment to your health goals. Please review the following plan we discussed and let me know if I can assist you in the future.   Screening recommendations/referrals: Colorectal Screening: No longer indicated Mammogram: No longer indicated  Bone Density: No longer indicated  Vision and Dental Exams: Recommended annual ophthalmology exams for early detection of glaucoma and other disorders of the eye Recommended annual dental exams for proper oral hygiene  Diabetic Exams: Diabetic Eye Exam: recommended yearly; up to date Diabetic Foot Exam: recommended yearly; up to date  Vaccinations: Influenza vaccine:  recommended this fall either at PCP office or through your local pharmacy  Pneumococcal vaccine: up to date; last 09/22/14 Tdap vaccine: up to date; last 07/29/11  Shingles vaccine: Please call your insurance company to determine your out of pocket expense for the Shingrix vaccine. You may receive this vaccine at your local pharmacy.  Advanced directives: Please bring a copy of your POA (Power of Attorney) and/or Living Will to your next appointment.  Goals: Recommend to drink at least 6-8 8oz glasses of water per day and consume a balanced diet rich in fresh fruits and vegetables.   Next appointment: Please schedule your Annual Wellness Visit with your Nurse Health Advisor in one year.  Preventive Care 83 Years and Older, Female Preventive care refers to lifestyle choices and visits with your health care provider that can promote health and wellness. What does preventive care include?  A yearly physical exam. This is also called an annual well check.  Dental exams once or twice a year.  Routine eye exams. Ask your health care provider how often you should have your eyes checked.  Personal lifestyle choices, including:  Daily care of your teeth and gums.  Regular physical  activity.  Eating a healthy diet.  Avoiding tobacco and drug use.  Limiting alcohol use.  Practicing safe sex.  Taking low-dose aspirin every day if recommended by your health care provider.  Taking vitamin and mineral supplements as recommended by your health care provider. What happens during an annual well check? The services and screenings done by your health care provider during your annual well check will depend on your age, overall health, lifestyle risk factors, and family history of disease. Counseling  Your health care provider may ask you questions about your:  Alcohol use.  Tobacco use.  Drug use.  Emotional well-being.  Home and relationship well-being.  Sexual activity.  Eating habits.  History of falls.  Memory and ability to understand (cognition).  Work and work Statistician.  Reproductive health. Screening  You may have the following tests or measurements:  Height, weight, and BMI.  Blood pressure.  Lipid and cholesterol levels. These may be checked every 5 years, or more frequently if you are over 83 years old.  Skin check.  Lung cancer screening. You may have this screening every year starting at age 83 if you have a 30-pack-year history of smoking and currently smoke or have quit within the past 15 years.  Fecal occult blood test (FOBT) of the stool. You may have this test every year starting at age 83.  Flexible sigmoidoscopy or colonoscopy. You may have a sigmoidoscopy every 5 years or a colonoscopy every 10 years starting at age 83.  Hepatitis C blood test.  Hepatitis B blood test.  Sexually transmitted disease (STD) testing.  Diabetes screening. This is done by checking  your blood sugar (glucose) after you have not eaten for a while (fasting). You may have this done every 1-3 years.  Bone density scan. This is done to screen for osteoporosis. You may have this done starting at age 83.  Mammogram. This may be done every 1-2  years. Talk to your health care provider about how often you should have regular mammograms. Talk with your health care provider about your test results, treatment options, and if necessary, the need for more tests. Vaccines  Your health care provider may recommend certain vaccines, such as:  Influenza vaccine. This is recommended every year.  Tetanus, diphtheria, and acellular pertussis (Tdap, Td) vaccine. You may need a Td booster every 10 years.  Zoster vaccine. You may need this after age 83.  Pneumococcal 13-valent conjugate (PCV13) vaccine. One dose is recommended after age 38.  Pneumococcal polysaccharide (PPSV23) vaccine. One dose is recommended after age 83. Talk to your health care provider about which screenings and vaccines you need and how often you need them. This information is not intended to replace advice given to you by your health care provider. Make sure you discuss any questions you have with your health care provider. Document Released: 07/31/2015 Document Revised: 03/23/2016 Document Reviewed: 05/05/2015 Elsevier Interactive Patient Education  2017 [place] Prevention in the Home Falls can cause injuries. They can happen to people of all ages. There are many things you can do to make your home safe and to help prevent falls. What can I do on the outside of my home?  Regularly fix the edges of walkways and driveways and fix any cracks.  Remove anything that might make you trip as you walk through a door, such as a raised step or threshold.  Trim any bushes or trees on the path to your home.  Use bright outdoor lighting.  Clear any walking paths of anything that might make someone trip, such as rocks or tools.  Regularly check to see if handrails are loose or broken. Make sure that both sides of any steps have handrails.  Any raised decks and porches should have guardrails on the edges.  Have any leaves, snow, or ice cleared regularly.  Use  sand or salt on walking paths during winter.  Clean up any spills in your garage right away. This includes oil or grease spills. What can I do in the bathroom?  Use night lights.  Install grab bars by the toilet and in the tub and shower. Do not use towel bars as grab bars.  Use non-skid mats or decals in the tub or shower.  If you need to sit down in the shower, use a plastic, non-slip stool.  Keep the floor dry. Clean up any water that spills on the floor as soon as it happens.  Remove soap buildup in the tub or shower regularly.  Attach bath mats securely with double-sided non-slip rug tape.  Do not have throw rugs and other things on the floor that can make you trip. What can I do in the bedroom?  Use night lights.  Make sure that you have a light by your bed that is easy to reach.  Do not use any sheets or blankets that are too big for your bed. They should not hang down onto the floor.  Have a firm chair that has side arms. You can use this for support while you get dressed.  Do not have throw rugs and other things on the floor  that can make you trip. What can I do in the kitchen?  Clean up any spills right away.  Avoid walking on wet floors.  Keep items that you use a lot in easy-to-reach places.  If you need to reach something above you, use a strong step stool that has a grab bar.  Keep electrical cords out of the way.  Do not use floor polish or wax that makes floors slippery. If you must use wax, use non-skid floor wax.  Do not have throw rugs and other things on the floor that can make you trip. What can I do with my stairs?  Do not leave any items on the stairs.  Make sure that there are handrails on both sides of the stairs and use them. Fix handrails that are broken or loose. Make sure that handrails are as long as the stairways.  Check any carpeting to make sure that it is firmly attached to the stairs. Fix any carpet that is loose or worn.  Avoid  having throw rugs at the top or bottom of the stairs. If you do have throw rugs, attach them to the floor with carpet tape.  Make sure that you have a light switch at the top of the stairs and the bottom of the stairs. If you do not have them, ask someone to add them for you. What else can I do to help prevent falls?  Wear shoes that:  Do not have high heels.  Have rubber bottoms.  Are comfortable and fit you well.  Are closed at the toe. Do not wear sandals.  If you use a stepladder:  Make sure that it is fully opened. Do not climb a closed stepladder.  Make sure that both sides of the stepladder are locked into place.  Ask someone to hold it for you, if possible.  Clearly mark and make sure that you can see:  Any grab bars or handrails.  First and last steps.  Where the edge of each step is.  Use tools that help you move around (mobility aids) if they are needed. These include:  Canes.  Walkers.  Scooters.  Crutches.  Turn on the lights when you go into a dark area. Replace any light bulbs as soon as they burn out.  Set up your furniture so you have a clear path. Avoid moving your furniture around.  If any of your floors are uneven, fix them.  If there are any pets around you, be aware of where they are.  Review your medicines with your doctor. Some medicines can make you feel dizzy. This can increase your chance of falling. Ask your doctor what other things that you can do to help prevent falls. This information is not intended to replace advice given to you by your health care provider. Make sure you discuss any questions you have with your health care provider. Document Released: 04/30/2009 Document Revised: 12/10/2015 Document Reviewed: 08/08/2014 Elsevier Interactive Patient Education  2017 [employer].

## 2019-05-20 NOTE — Progress Notes (Signed)
I have reviewed and agree with note, evaluation, plan.   Carmon Sahli, MD  

## 2019-05-20 NOTE — Progress Notes (Signed)
I connected with Michelle [name] on 05/20/19 at 1100 by phone that I am speaking with the correct person using two identifiers. Location patient: Home Location provider: [facility], Office Persons participating in the virtual visit: Denman George LPN and Dr. [name]    I discussed the limitations of evaluation and management by telemedicine and the availability of in person appointments. The patient expressed understanding and agreed to proceed.  Subjective:   Michelle [name] is a 85 y.o. female who presents for Medicare Annual (Subsequent) preventive examination.  Review of Systems:   Cardiac Risk Factors include: advanced age (>20men, >5 women);diabetes mellitus;hypertension;dyslipidemia     Objective:     Vitals: There were no vitals taken for this visit.  There is no height or weight on file to calculate BMI.  Advanced Directives 05/20/2019 05/16/2018 04/14/2017 12/14/2014 07/13/2014 03/14/2014 03/14/2014  Does Patient Have a Medical Advance Directive? Yes No No No No - No  Type of Advance Directive Living will;Healthcare Power of Attorney - - - - - -  Does patient want to make changes to medical advance directive? No - Patient declined - - - - - -  Copy of [place] in Chart? No - copy requested - - - - - -  Would patient like information on creating a medical advance directive? - No - Patient declined (No Data) No - patient declined information No - patient declined information No - patient declined information No - patient declined information  Pre-existing out of facility DNR order (yellow form or pink MOST form) - - - - - - -    Tobacco Social History   Tobacco Use  Smoking Status Former Smoker  . Packs/day: 1.00  . Years: 55.00  . Pack years: 55.00  . Types: Cigarettes  . Quit date: 09/29/2006  . Years since quitting: 12.6  Smokeless Tobacco Never Used     Counseling given: Not Answered   Clinical Intake:  Pre-visit preparation  completed: Yes  Pain : No/denies pain  Diabetes: Yes CBG done?: No Did pt. bring in CBG monitor from home?: No  How often do you need to have someone help you when you read instructions, pamphlets, or other written materials from your doctor or pharmacy?: 2 - Rarely  Interpreter Needed?: No  Information entered by :: Denman George LPN  Past Medical History:  Diagnosis Date  . Anginal pain ([place])   . Arthritis   . AV block, 1st degree   . CAD (coronary artery disease) 1990; 2015   Cardiac cath 1990 with Dr. Lia Foyer and [name] Residual CAD, non obstructive per 2015 cath. STEMI status post stent in August 2015.  Marland Kitchen COPD (chronic obstructive pulmonary disease) ([place])   . Diabetes mellitus type 2, insulin dependent ([place])   . Essential hypertension   . Hyperlipidemia with target LDL less than 70   . Myocardial infarction ([place])   . Pericarditis-post MI (short course of steroids) 03/06/2014  . 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 Shortness of breath    Past Surgical History:  Procedure Laterality Date  . CATARACT EXTRACTION  2010  . CHOLECYSTECTOMY    . CORONARY ANGIOPLASTY WITH STENT PLACEMENT  02/18/14   Promus DES to RCA  .  LEFT HEART CATHETERIZATION WITH CORONARY ANGIOGRAM N/A 02/18/2014   Procedure: LEFT HEART CATHETERIZATION WITH CORONARY ANGIOGRAM;  Surgeon: Jettie Booze, MD;  Location: [facility] CATH LAB;  Service: Cardiovascular;  Laterality: N/A;  . PTCA  1990   PTCA of RCA  . TRANSTHORACIC ECHOCARDIOGRAM  02/02/2012   mild LVH, EF 55-60%, Normal WM, Gr 1 DD; Mild MR   Family History  Problem Relation Age of Onset  . Heart attack Mother 35  . Cancer Father 91  . Cancer Maternal Grandfather   . Cancer Paternal Grandmother   . Cancer Paternal  Grandfather   . Diabetes Son   . Liver cancer Brother   . Bone cancer Brother   . Heart attack Son   . Hypertension Son   . Diabetes Son   . Hyperlipidemia Son    Social History   Socioeconomic History  . Marital status: Married    Spouse name: Not on file  . Number of children: 2  . Years of education: Not on file  . Highest education level: Not on file  Occupational History  . Occupation: Retired    Fish farm manager: RETIRED  Social Needs  . Financial resource strain: Not on file  . Food insecurity    Worry: Not on file    Inability: Not on file  . Transportation needs    Medical: Not on file    Non-medical: Not on file  Tobacco Use  . Smoking status: Former Smoker    Packs/day: 1.00    Years: 55.00    Pack years: 55.00    Types: Cigarettes    Quit date: 09/29/2006    Years since quitting: 12.6  . Smokeless tobacco: Never Used  Substance and Sexual Activity  . Alcohol use: No  . Drug use: No  . Sexual activity: Not Currently  Lifestyle  . Physical activity    Days per week: Not on file    Minutes per session: Not on file  . Stress: Not on file  Relationships  . Social Herbalist on phone: Not on file    Gets together: Not on file    Attends religious service: Not on file    Active member of club or organization: Not on file    Attends meetings of clubs or organizations: Not on file    Relationship status: Not on file  Other Topics Concern  . Not on file  Social History Narrative   Lives with her son but he works all day. Home for dinner.  Independent ADL. Needs assist on some IADL.     Outpatient Encounter Medications as of 05/20/2019  Medication Sig  . ACCU-CHEK FASTCLIX LANCETS MISC   . ACCU-CHEK SMARTVIEW test strip TEST BLOOD SUGAR THREE TIMES DAILY  . acetaminophen (TYLENOL) 325 MG tablet Take 650 mg by mouth every 6 (six) hours as needed for mild pain. Reported on 10/23/2015  . albuterol (PROVENTIL HFA;VENTOLIN HFA) 108 (90 Base) MCG/ACT inhaler  Inhale 1 puff into the lungs every 4 (four) hours as needed for wheezing or shortness of breath.  Marland Kitchen albuterol (PROVENTIL) (2.5 MG/3ML) 0.083% nebulizer solution TAKE 3ML (2.5MG  TOTAL) BY NEBULIZATION EVERY 6 HOURS AS NEEDED FOR WHEEZING OR FOR SHORTNESS OF BREATH  . Alcohol Swabs (B-D SINGLE USE SWABS REGULAR) PADS USE  FOR  TESTING THREE TIMES DAILY  . amLODipine (NORVASC) 10 MG tablet TAKE 1 TABLET EVERY DAY  . aspirin 81 MG tablet Take 81 mg by mouth daily.   Marland Kitchen  Blood Glucose Calibration (ACCU-CHEK SMARTVIEW CONTROL) LIQD   . carvedilol (COREG) 12.5 MG tablet TAKE 1 TABLET BY MOUTH TWICE DAILY WITH A MEAL  . clopidogrel (PLAVIX) 75 MG tablet Take 1 tablet (75 mg total) by mouth daily. Please keep upcoming appt in November with Dr. Lonna Cobb for future refills. Thank you  . enalapril (VASOTEC) 20 MG tablet TAKE 1 TABLET TWICE DAILY  . fenofibrate micronized (LOFIBRA) 134 MG capsule TAKE ONE CAPSULE BY MOUTH DAILY  . ferrous sulfate 325 (65 FE) MG tablet Take 1 tablet (325 mg total) by mouth 2 (two) times daily with a meal.  . fish oil-omega-3 fatty acids 1000 MG capsule Take 2 g by mouth daily.    . Fluticasone-Salmeterol (ADVAIR) 250-50 MCG/DOSE AEPB INHALE ONE PUFF BY MOUTH DAILY AS NEEDED  . folic acid (FOLVITE) A999333 MCG tablet Take 400 mcg by mouth daily.    . furosemide (LASIX) 40 MG tablet TAKE ONE TABLET BY MOUTH DAILY  . Insulin Glargine (LANTUS SOLOSTAR) 100 UNIT/ML Solostar Pen INJECT  17  TO  20 UNITS SUBCUTANEOUSLY EVERY MORNING  . Insulin Pen Needle (B-D ULTRAFINE III SHORT PEN) 31G X 8 MM MISC USE AS DIRECTED WITH LANTUS SOLOSTAR  . KLOR-CON M20 20 MEQ tablet TAKE ONE TABLET BY MOUTH DAILY  . Melatonin 5 MG CAPS Take 1 capsule by mouth at bedtime.   . metFORMIN (GLUCOPHAGE) 1000 MG tablet TAKE 1 TABLET TWICE DAILY  . nitroGLYCERIN (NITROSTAT) 0.4 MG SL tablet PLACE 1 TABLET (0.4 MG TOTAL) UNDER THE TONGUE EVERY 5 (FIVE) MINUTES AS NEEDED FOR CHEST PAIN (CHEST PAIN OR SHORTNESS OF  BREATH).  Marland Kitchen pantoprazole (PROTONIX) 40 MG tablet TAKE ONE TABLET BY MOUTH DAILY  . Potassium Chloride ER 20 MEQ TBCR TAKE ONE TABLET BY MOUTH DAILY  . rosuvastatin (CRESTOR) 40 MG tablet TAKE 1 TABLET EVERY DAY  . sertraline (ZOLOFT) 25 MG tablet TAKE 1 TABLET EVERY DAY  . sodium chloride (OCEAN) 0.65 % SOLN nasal spray Place 1 spray into both nostrils as needed for congestion.  Marland Kitchen SPIRIVA HANDIHALER 18 MCG inhalation capsule INHALE THE ENTIRE CONTENTS OF 1 CAPSULE ONCE A DAY USING HANDIHALER DEVICE AT 2 PM  . traMADol (ULTRAM) 50 MG tablet TAKE ONE TABLET BY MOUTH THREE TIMES A DAY AS NEEDED (30 DAY SUPPLY)  . triamcinolone cream (KENALOG) 0.1 % Apply 1 application topically 2 (two) times daily.  . vitamin E 400 UNIT capsule Take 400 Units by mouth daily.     No facility-administered encounter medications on file as of 05/20/2019.     Activities of Daily Living In your present state of health, do you have any difficulty performing the following activities: 05/20/2019  Hearing? N  Vision? N  Difficulty concentrating or making decisions? N  Walking or climbing stairs? N  Dressing or bathing? N  Doing errands, shopping? N  Preparing Food and eating ? N  Using the Toilet? N  In the past six months, have you accidently leaked urine? N  Do you have problems with loss of bowel control? N  Managing your Medications? N  Managing your Finances? N  Housekeeping or managing your Housekeeping? N  Some recent data might be hidden    Patient Care Team: [name], MD as PCP - General (Family Medicine) Burnell Blanks, MD as PCP - Cardiology (Cardiology) Karin Golden, MD as Consulting Physician (Dermatology) Bernarda Caffey, MD as Consulting Physician (Ophthalmology)    Assessment:   This is a routine wellness examination  for [employer].  Exercise Activities and Dietary recommendations Current Exercise Habits: The patient does not participate in regular exercise at present  Goals     . Exercise 150 minutes per week (moderate activity)     Exercise in the chair Get up from the chair 3 times ; 3 times a day Walk in the home a couple times a day        Fall Risk Fall Risk  05/20/2019 02/22/2019 05/16/2018 04/14/2017 07/12/2016  Falls in the past year? 0 0 Yes No Yes  Number falls in past yr: - 0 2 or more - -  Injury with Fall? 0 0 No - -  Risk for fall due to : - Impaired balance/gait;Impaired mobility Impaired balance/gait;Impaired mobility - -  Follow up Falls evaluation completed;Education provided;Falls prevention discussed - - - -   Is the patient's home free of loose throw rugs in walkways, pet beds, electrical cords, etc?   yes      Grab bars in the bathroom? yes      Handrails on the stairs?   yes      Adequate lighting?   yes  Depression Screen PHQ 2/9 Scores 05/20/2019 02/22/2019 05/16/2018 11/17/2017  PHQ - 2 Score 0 0 0 0  PHQ- 9 Score - 8 - 4     Cognitive Function-no cognitive concerns at this time  MMSE - Mini Mental State Exam 04/14/2017  Not completed: (No Data)     6CIT Screen 05/20/2019 05/16/2018  What Year? 0 points 0 points  What month? 0 points 0 points  What time? 0 points 0 points  Count back from 20 0 points 0 points  Months in reverse 0 points 0 points  Repeat phrase 2 points 0 points  Total Score 2 0    Immunization History  Administered Date(s) Administered  . Influenza Split 04/21/2011, 05/16/2012  . Influenza Whole 05/24/2007, 05/01/2009, 04/17/2010  . Influenza, High Dose Seasonal PF 03/23/2018  . Influenza,inj,Quad PF,6+ Mos 04/14/2013, 04/10/2014, 04/03/2015  . Influenza-Unspecified 06/01/2016, 06/15/2017  . Pneumococcal Conjugate-13 09/22/2014  . Pneumococcal Polysaccharide-23 04/18/2003  . Tdap 07/29/2011  . Zoster 04/18/2011    Qualifies for Shingles Vaccine?Discussed and patient will check with pharmacy for coverage.  Patient education handout provided    Screening Tests Health Maintenance  Topic Date Due   . OPHTHALMOLOGY EXAM  04/28/2018  . INFLUENZA VACCINE  02/16/2019  . DEXA SCAN  04/21/2043 (Originally 12/31/1999)  . HEMOGLOBIN A1C  08/25/2019  . FOOT EXAM  02/22/2020  . TETANUS/TDAP  07/28/2021  . PNA vac Low Risk Adult  Completed    Cancer Screenings: Lung: Low Dose CT Chest recommended if Age 67-80 years, 30 pack-year currently smoking OR have quit w/in 15years. Patient does not qualify. Breast:  Up to date on Mammogram? Yes   Up to date of Bone Density/Dexa? Yes Colorectal: No longer indicated     Plan:  I have personally reviewed and addressed the Medicare Annual Wellness questionnaire and have noted the following in the patient's chart:  A. Medical and social history B. Use of alcohol, tobacco or illicit drugs  C. Current medications and supplements D. Functional ability and status E.  Nutritional status F.  Physical activity G. Advance directives H. List of other physicians I.  Hospitalizations, surgeries, and ER visits in previous 12 months J.  [place] such as hearing and vision if needed, cognitive and depression L. Referrals, records requested, and appointments- will request notes from last diabetic eye  exam  In addition, I have reviewed and discussed with patient certain preventive protocols, quality metrics, and best practice recommendations. A written personalized care plan for preventive services as well as general preventive health recommendations were provided to patient.   Signed,  Denman George, LPN  Nurse Health Advisor   Nurse Notes: no additional

## 2019-05-31 ENCOUNTER — Encounter: Payer: Self-pay | Admitting: Cardiovascular Disease

## 2019-05-31 ENCOUNTER — Other Ambulatory Visit: Payer: Self-pay

## 2019-05-31 ENCOUNTER — Ambulatory Visit (INDEPENDENT_AMBULATORY_CARE_PROVIDER_SITE_OTHER): Payer: Medicare HMO | Admitting: Cardiovascular Disease

## 2019-05-31 VITALS — BP 138/62 | HR 71 | Ht 65.0 in | Wt 183.8 lb

## 2019-05-31 DIAGNOSIS — I10 Essential (primary) hypertension: Secondary | ICD-10-CM | POA: Diagnosis not present

## 2019-05-31 DIAGNOSIS — I2510 Atherosclerotic heart disease of native coronary artery without angina pectoris: Secondary | ICD-10-CM | POA: Diagnosis not present

## 2019-05-31 DIAGNOSIS — E7800 Pure hypercholesterolemia, unspecified: Secondary | ICD-10-CM

## 2019-05-31 DIAGNOSIS — I5032 Chronic diastolic (congestive) heart failure: Secondary | ICD-10-CM | POA: Diagnosis not present

## 2019-05-31 NOTE — Patient Instructions (Signed)
Medication Instructions:  Your provider recommends that you continue on your current medications as directed. Please refer to the Current Medication list given to you today.   *If you need a refill on your cardiac medications before your next appointment, please call your pharmacy*  Lab Work: None If you have labs (blood work) drawn today and your tests are completely normal, you will receive your results only by: Marland Kitchen MyChart Message (if you have MyChart) OR . A paper copy in the mail If you have any lab test that is abnormal or we need to change your treatment, we will call you to review the results.  Testing/Procedures: None  Follow-Up: At [facility], you and your health needs are our priority.  As part of our continuing mission to provide you with exceptional heart care, we have created designated Provider Care Teams.  These Care Teams include your primary Cardiologist (physician) and Advanced Practice Providers (APPs -  Physician Assistants and Nurse Practitioners) who all work together to provide you with the care you need, when you need it. Your next appointment:   12 months The format for your next appointment:   In Person Provider:   You may see Lauree Chandler, MD or one of the following Advanced Practice Providers on your designated Care Team:    Melina Copa, PA-C  Ermalinda Barrios, PA-C

## 2019-05-31 NOTE — Progress Notes (Signed)
Chief Complaint  Patient presents with  . Follow-up    CAD    History of Present Illness: 85 yo female with a history of diastolic CHF, CAD s/p PTCA of a large dominant RCA in 1990 and inferior STEMI August 2015, DM, HTN, HLD, COPD, former tobacco abuse here today for cardiac follow up. She was admitted with an inferior STEMI in August 2015 and emergent cardiac cath demonstrated an occluded RCA with aneurysm formation at the prior angioplasty site. The aneurysmal segment was treated with a covered stent. 2 drug-eluting stents (one proximal and one distal to the covered stent) were placed. There was a small pericardial effusion noted post MI. She was discharged on a short course of steroids and follow up echo demonstrated just trivial effusion. Echo August 2015 showed normal LVEF and no significant pericardial effusion.She was readmitted one week later with acute CHF and diuresis was complicated by AKI and ACEI was held. She became dyspneic on Brilinta. She is now on Plavix.   She is here today for follow up. The patient denies any chest pain, dyspnea, palpitations, lower extremity edema, orthopnea, PND, dizziness, near syncope or syncope. '  Primary Care Physician: [name], MD   Past Medical History:  Diagnosis Date  . Anginal pain ([place])   . Arthritis   . AV block, 1st degree   . CAD (coronary artery disease) 1990; 2015   Cardiac cath 1990 with Dr. Lia Foyer and [name] Residual CAD, non obstructive per 2015 cath. STEMI status post stent in August 2015.  Marland Kitchen COPD (chronic obstructive pulmonary disease) ([place])   . Diabetes mellitus type 2, insulin dependent ([place])   . Essential hypertension   . Hyperlipidemia with target LDL less than 70   . Myocardial infarction ([place])   . Pericarditis-post MI (short course of steroids) 03/06/2014   . 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 Shortness of breath     Past Surgical History:  Procedure Laterality Date  . CATARACT EXTRACTION  2010  . CHOLECYSTECTOMY    . CORONARY ANGIOPLASTY WITH STENT PLACEMENT  02/18/14   Promus DES to RCA  . LEFT HEART CATHETERIZATION WITH CORONARY ANGIOGRAM N/A 02/18/2014   Procedure: LEFT HEART CATHETERIZATION WITH CORONARY ANGIOGRAM;  Surgeon: Jettie Booze, MD;  Location: [facility] CATH LAB;  Service: Cardiovascular;  Laterality: N/A;  . PTCA  1990   PTCA of RCA  . TRANSTHORACIC ECHOCARDIOGRAM  02/02/2012   mild LVH, EF 55-60%, Normal WM, Gr 1 DD; Mild MR    Current Outpatient Medications  Medication Sig Dispense Refill  . ACCU-CHEK FASTCLIX LANCETS MISC     . ACCU-CHEK SMARTVIEW test strip TEST BLOOD SUGAR THREE TIMES DAILY 300 strip 1  . acetaminophen (TYLENOL) 325 MG tablet Take 650 mg by mouth every 6 (six) hours as needed for mild pain. Reported on 10/23/2015    . albuterol (PROVENTIL HFA;VENTOLIN HFA) 108 (90 Base) MCG/ACT inhaler Inhale 1 puff into the lungs every 4 (four) hours as needed for wheezing or shortness of breath. 1 Inhaler 0  . albuterol (PROVENTIL) (2.5 MG/3ML) 0.083% nebulizer solution TAKE 3ML (2.5MG  TOTAL) BY NEBULIZATION EVERY 6 HOURS AS NEEDED FOR WHEEZING OR FOR SHORTNESS OF BREATH 75 vial 4  . Alcohol Swabs (B-D  SINGLE USE SWABS REGULAR) PADS USE  FOR  TESTING THREE TIMES DAILY 300 each 1  . amLODipine (NORVASC) 10 MG tablet TAKE 1 TABLET EVERY DAY 90 tablet 3  . aspirin 81 MG tablet Take 81 mg by mouth daily.     . Blood Glucose Calibration (ACCU-CHEK SMARTVIEW CONTROL) LIQD     . carvedilol (COREG) 12.5 MG tablet TAKE 1 TABLET BY MOUTH TWICE DAILY WITH A MEAL 180 tablet 4  . clopidogrel (PLAVIX) 75 MG tablet Take 1 tablet (75 mg total) by mouth daily. Please keep upcoming appt in November with Dr. Lonna Cobb for future refills. Thank you 90 tablet 0   . enalapril (VASOTEC) 20 MG tablet TAKE 1 TABLET TWICE DAILY 180 tablet 3  . fenofibrate micronized (LOFIBRA) 134 MG capsule TAKE ONE CAPSULE BY MOUTH DAILY 90 capsule 1  . ferrous sulfate 325 (65 FE) MG tablet Take 1 tablet (325 mg total) by mouth 2 (two) times daily with a meal. 60 tablet 3  . fish oil-omega-3 fatty acids 1000 MG capsule Take 2 g by mouth daily.      . Fluticasone-Salmeterol (ADVAIR) 250-50 MCG/DOSE AEPB INHALE ONE PUFF BY MOUTH DAILY AS NEEDED 60 each 5  . folic acid (FOLVITE) A999333 MCG tablet Take 400 mcg by mouth daily.      . furosemide (LASIX) 40 MG tablet TAKE ONE TABLET BY MOUTH DAILY 90 tablet 4  . Insulin Glargine (LANTUS SOLOSTAR) 100 UNIT/ML Solostar Pen INJECT  17  TO  20 UNITS SUBCUTANEOUSLY EVERY MORNING 30 mL 10  . Insulin Pen Needle (B-D ULTRAFINE III SHORT PEN) 31G X 8 MM MISC USE AS DIRECTED WITH LANTUS SOLOSTAR 100 each 3  . KLOR-CON M20 20 MEQ tablet TAKE ONE TABLET BY MOUTH DAILY 90 tablet 0  . Melatonin 5 MG CAPS Take 1 capsule by mouth at bedtime.     . metFORMIN (GLUCOPHAGE) 1000 MG tablet TAKE 1 TABLET TWICE DAILY 180 tablet 1  . nitroGLYCERIN (NITROSTAT) 0.4 MG SL tablet PLACE 1 TABLET (0.4 MG TOTAL) UNDER THE TONGUE EVERY 5 (FIVE) MINUTES AS NEEDED FOR CHEST PAIN (CHEST PAIN OR SHORTNESS OF BREATH). 25 tablet 4  . pantoprazole (PROTONIX) 40 MG tablet TAKE ONE TABLET BY MOUTH DAILY 39 tablet 1  . Potassium Chloride ER 20 MEQ TBCR TAKE ONE TABLET BY MOUTH DAILY 30 tablet 10  . rosuvastatin (CRESTOR) 40 MG tablet TAKE 1 TABLET EVERY DAY 90 tablet 2  . sertraline (ZOLOFT) 25 MG tablet TAKE 1 TABLET EVERY DAY 90 tablet 3  . sodium chloride (OCEAN) 0.65 % SOLN nasal spray Place 1 spray into both nostrils as needed for congestion.    Marland Kitchen SPIRIVA HANDIHALER 18 MCG inhalation capsule INHALE THE ENTIRE CONTENTS OF 1 CAPSULE ONCE A DAY USING HANDIHALER DEVICE AT 2 PM 90 capsule 1  . traMADol (ULTRAM) 50 MG tablet TAKE ONE TABLET BY MOUTH THREE TIMES A DAY AS NEEDED  (30 DAY SUPPLY) 70 tablet 5  . triamcinolone cream (KENALOG) 0.1 % Apply 1 application topically 2 (two) times daily. 30 g 0  . vitamin E 400 UNIT capsule Take 400 Units by mouth daily.       No current facility-administered medications for this visit.     Allergies  Allergen Reactions  . Codeine Sulfate Nausea Only  . Morphine Sulfate Nausea Only    Social History   Socioeconomic History  . Marital status: Married    Spouse name: Not on file  . Number of children:  2  . Years of education: Not on file  . Highest education level: Not on file  Occupational History  . Occupation: Retired    Fish farm manager: RETIRED  Social Needs  . Financial resource strain: Not on file  . Food insecurity    Worry: Not on file    Inability: Not on file  . Transportation needs    Medical: Not on file    Non-medical: Not on file  Tobacco Use  . Smoking status: Former Smoker    Packs/day: 1.00    Years: 55.00    Pack years: 55.00    Types: Cigarettes    Quit date: 09/29/2006    Years since quitting: 12.6  . Smokeless tobacco: Never Used  Substance and Sexual Activity  . Alcohol use: No  . Drug use: No  . Sexual activity: Not Currently  Lifestyle  . Physical activity    Days per week: Not on file    Minutes per session: Not on file  . Stress: Not on file  Relationships  . Social Herbalist on phone: Not on file    Gets together: Not on file    Attends religious service: Not on file    Active member of club or organization: Not on file    Attends meetings of clubs or organizations: Not on file    Relationship status: Not on file  . Intimate partner violence    Fear of current or ex partner: Not on file    Emotionally abused: Not on file    Physically abused: Not on file    Forced sexual activity: Not on file  Other Topics Concern  . Not on file  Social History Narrative   Lives with her son but he works all day. Home for dinner.  Independent ADL. Needs assist on some IADL.      Family History  Problem Relation Age of Onset  . Heart attack Mother 9  . Cancer Father 15  . Cancer Maternal Grandfather   . Cancer Paternal Grandmother   . Cancer Paternal Grandfather   . Diabetes Son   . Liver cancer Brother   . Bone cancer Brother   . Heart attack Son   . Hypertension Son   . Diabetes Son   . Hyperlipidemia Son     Review of Systems:  As stated in the HPI and otherwise negative.   BP 138/62   Pulse 71   Ht 5\' 5"  (1.651 m)   Wt 183 lb 12.8 oz (83.4 kg)   SpO2 97%   BMI 30.59 kg/m   Physical Examination:  General: Well developed, well nourished, NAD  HEENT: OP clear, mucus membranes moist  SKIN: warm, dry. No rashes. Neuro: No focal deficits  Musculoskeletal: Muscle strength 5/5 all ext  Psychiatric: Mood and affect normal  Neck: No JVD, no carotid bruits, no thyromegaly, no lymphadenopathy.  Lungs:Clear bilaterally, no wheezes, rhonci, crackles Cardiovascular: Regular rate and rhythm. No murmurs, gallops or rubs. Abdomen:Soft. Bowel sounds present. Non-tender.  Extremities: No lower extremity edema. Pulses are 2 + in the bilateral DP/PT.  EKG:  EKG is ordered today. The ekg ordered today demonstrates Sinus with 1st degree AV block.   Recent Labs: 02/22/2019: ALT 11; BUN 18; Creat 0.81; Hemoglobin 13.1; Platelets 212; Potassium 3.8; Sodium 139   Lipid Panel    Component Value Date/Time   CHOL 134 02/22/2019 1600   TRIG 436 (H) 02/22/2019 1600   TRIG 1693 (HH) 07/28/2006 1202  HDL 41 (L) 02/22/2019 1600   CHOLHDL 3.3 02/22/2019 1600   VLDL UNABLE TO CALCULATE IF TRIGLYCERIDE OVER 400 mg/dL 02/19/2014 0228   LDLCALC  02/22/2019 1600     Comment:     . LDL cholesterol not calculated. Triglyceride levels greater than 400 mg/dL invalidate calculated LDL results. . Reference range: <100 . Desirable range <100 mg/dL for primary prevention;   <70 mg/dL for patients with CHD or diabetic patients  with > or = 2 CHD risk factors. Marland Kitchen  LDL-C is now calculated using the Martin-Hopkins  calculation, which is a validated novel method providing  better accuracy than the Friedewald equation in the  estimation of LDL-C.  Cresenciano Genre et al. Annamaria Helling. [id number]): 2061-2068  (http://education.[employer].com/faq/FAQ164)    LDLDIRECT 37 07/27/2018 1631     Wt Readings from Last 3 Encounters:  05/31/19 183 lb 12.8 oz (83.4 kg)  02/22/19 179 lb 3.2 oz (81.3 kg)  07/27/18 185 lb 6.4 oz (84.1 kg)     Other studies Reviewed: Additional studies/ records that were reviewed today include:. Review of the above records demonstrates:    Assessment and Plan:   1. CAD without angina: She has no chest pain. Continue lifetime DAPT with ASA and Plavix given the overlapping covered stent and drug eluting stents in the RCA. Will continue statin and beta blocker.     2. HTN: BP well controlled.   3. Hyperlipidemia: Lipids are managed in primary care. Will continue statin.   4. Chronic diastolic CHF: Weight is stable. No evidence of volume overload. Continue Lasix.   Current medicines are reviewed at length with the patient today.  The patient does not have concerns regarding medicines.  The following changes have been made:  no change  Labs/ tests ordered today include:  Orders Placed This Encounter  Procedures  . EKG 12-Lead    Disposition:   FU with me in 12 months  Signed, Lauree Chandler, MD 05/31/2019 4:00 PM    [facility] [place], [place], [place]  [address] Phone: [phone]; Fax: [phone]

## 2019-06-11 ENCOUNTER — Other Ambulatory Visit: Payer: Self-pay | Admitting: Family Medicine

## 2019-06-18 ENCOUNTER — Other Ambulatory Visit: Payer: Self-pay | Admitting: Family Medicine

## 2019-07-05 ENCOUNTER — Other Ambulatory Visit: Payer: Self-pay | Admitting: Family Medicine

## 2019-07-10 ENCOUNTER — Other Ambulatory Visit: Payer: Self-pay

## 2019-07-10 MED ORDER — FENOFIBRATE MICRONIZED 134 MG PO CAPS: 134.0000 mg | ORAL_CAPSULE | Freq: Every day | ORAL | 0 refills | Status: DC

## 2019-07-10 NOTE — Telephone Encounter (Signed)
See note  Copied from [place] [phone]. Topic: General - Other >> Jul 10, 2019 12:05 PM Leward Quan A wrote: Reason for CRM: Patient son called to say that mom is completely out of her medication fenofibrate micronized (LOFIBRA) 134 MG capsule and is asking for a refill to be sent to the pharmacy today please. Ph# [phone]

## 2019-07-14 ENCOUNTER — Other Ambulatory Visit: Payer: Self-pay | Admitting: Family Medicine

## 2019-07-23 ENCOUNTER — Encounter: Payer: Self-pay | Admitting: Family Medicine

## 2019-07-23 ENCOUNTER — Ambulatory Visit (INDEPENDENT_AMBULATORY_CARE_PROVIDER_SITE_OTHER): Payer: Medicare HMO | Admitting: Family Medicine

## 2019-07-23 ENCOUNTER — Other Ambulatory Visit: Payer: Self-pay

## 2019-07-23 VITALS — BP 130/78 | HR 69 | Temp 98.7°F | Ht 65.0 in | Wt 183.0 lb

## 2019-07-23 DIAGNOSIS — E119 Type 2 diabetes mellitus without complications: Secondary | ICD-10-CM | POA: Diagnosis not present

## 2019-07-23 DIAGNOSIS — I25118 Atherosclerotic heart disease of native coronary artery with other forms of angina pectoris: Secondary | ICD-10-CM | POA: Diagnosis not present

## 2019-07-23 DIAGNOSIS — E1159 Type 2 diabetes mellitus with other circulatory complications: Secondary | ICD-10-CM | POA: Diagnosis not present

## 2019-07-23 DIAGNOSIS — Z794 Long term (current) use of insulin: Secondary | ICD-10-CM

## 2019-07-23 DIAGNOSIS — I5032 Chronic diastolic (congestive) heart failure: Secondary | ICD-10-CM | POA: Diagnosis not present

## 2019-07-23 DIAGNOSIS — I10 Essential (primary) hypertension: Secondary | ICD-10-CM | POA: Diagnosis not present

## 2019-07-23 DIAGNOSIS — J449 Chronic obstructive pulmonary disease, unspecified: Secondary | ICD-10-CM | POA: Diagnosis not present

## 2019-07-23 LAB — BASIC METABOLIC PANEL
BUN: 16 mg/dL (ref 6–23)
CO2: 28 mEq/L (ref 19–32)
Calcium: 10.1 mg/dL (ref 8.4–10.5)
Chloride: 100 mEq/L (ref 96–112)
Creatinine, Ser: 0.74 mg/dL (ref 0.40–1.20)
GFR: 74.67 mL/min (ref >60.00)
Glucose, Bld: 100 mg/dL — ABNORMAL HIGH (ref 70–99)
Potassium: 4.3 mEq/L (ref 3.5–5.1)
Sodium: 138 mEq/L (ref 135–145)

## 2019-07-23 LAB — LDL CHOLESTEROL, DIRECT: Direct LDL: 40 mg/dL

## 2019-07-23 LAB — HEMOGLOBIN A1C: Hgb A1c MFr Bld: 6.5 % (ref 4.6–6.5)

## 2019-07-23 MED ORDER — ALBUTEROL SULFATE HFA 108 (90 BASE) MCG/ACT IN AERS: 1.0000 | INHALATION_SPRAY | RESPIRATORY_TRACT | 5 refills | Status: DC | PRN

## 2019-07-23 MED ORDER — METOPROLOL TARTRATE 25 MG PO TABS: 25.0000 mg | ORAL_TABLET | Freq: Two times a day (BID) | ORAL | 3 refills | Status: DC

## 2019-07-23 NOTE — Progress Notes (Signed)
Phone [phone] In person visit   Subjective:   Michelle [name] is a 86 y.o. year old very pleasant female patient who presents for/with See problem oriented charting Chief Complaint  Patient presents with  . Follow-up    ROS-Review of Systems  Constitutional: Negative.   HENT: Negative.   Eyes: Negative.   Respiratory: Positive for cough and wheezing.        Patient feels like current medications are helping.   Cardiovascular: Negative.   Gastrointestinal: Negative.   Genitourinary: Negative.   Musculoskeletal: Negative.   Skin: Negative.   Neurological: Negative.   Endo/Heme/Allergies: Negative.   Psychiatric/Behavioral: Negative.      This visit occurred during the SARS-CoV-2 public health emergency.  Safety protocols were in place, including screening questions prior to the visit, additional usage of staff PPE, and extensive cleaning of exam room while observing appropriate contact time as indicated for disinfecting solutions.   Past Medical History-  Patient Active Problem List   Diagnosis Date Noted  . Chronic diastolic CHF (congestive heart failure) ([place]) 03/13/2014    Priority: High  . CAD- RCA PCI '90s, STEMI-RCA DES 02/18/14 03/05/2014    Priority: High  . IDDM type 2 03/26/2007    Priority: High  . Anemia, iron deficiency 10/23/2015    Priority: Medium  . Osteoarthritis, knee 05/13/2014    Priority: Medium  . Anxiety state 04/21/2014    Priority: Medium  . SOB (shortness of breath) 03/05/2014    Priority: Medium  . Hyperlipidemia associated with type 2 diabetes mellitus ([place]) 03/26/2007    Priority: Medium  . Hypertension associated with diabetes ([place]) 04/21/2006    Priority: Medium  . COPD (chronic obstructive pulmonary disease) ([place]) 04/21/2006    Priority: Medium  . Actinic keratosis 11/17/2017    Priority: Low  . CKD (chronic kidney disease), stage II 07/25/2014    Priority: Low  . Back pain, lumbosacral 10/10/2012    Priority: Low  .  Coronary artery disease involving native coronary artery of native heart with other form of angina pectoris ([place]) 07/23/2019  . Chest pain 12/14/2014    Medications- reviewed and updated Current Outpatient Medications  Medication Sig Dispense Refill  . ACCU-CHEK FASTCLIX LANCETS MISC     . ACCU-CHEK SMARTVIEW test strip TEST BLOOD SUGAR THREE TIMES DAILY 300 strip 1  . acetaminophen (TYLENOL) 325 MG tablet Take 650 mg by mouth every 6 (six) hours as needed for mild pain. Reported on 10/23/2015    . albuterol (PROVENTIL) (2.5 MG/3ML) 0.083% nebulizer solution TAKE 3ML (2.5MG  TOTAL) BY NEBULIZATION EVERY 6 HOURS AS NEEDED FOR WHEEZING OR FOR SHORTNESS OF BREATH 75 vial 4  . albuterol (VENTOLIN HFA) 108 (90 Base) MCG/ACT inhaler Inhale 1 puff into the lungs every 4 (four) hours as needed for wheezing or shortness of breath (RESCUE inhaler if advair not working). 18 g 5  . Alcohol Swabs (B-D SINGLE USE SWABS REGULAR) PADS USE  FOR  TESTING THREE TIMES DAILY 300 each 1  . amLODipine (NORVASC) 10 MG tablet TAKE 1 TABLET EVERY DAY 90 tablet 3  . aspirin 81 MG tablet Take 81 mg by mouth daily.     . Blood Glucose Calibration (ACCU-CHEK SMARTVIEW CONTROL) LIQD     . clopidogrel (PLAVIX) 75 MG tablet Take 1 tablet (75 mg total) by mouth daily. Please keep upcoming appt in November with Dr. Lonna Cobb for future refills. Thank you 90 tablet 0  . enalapril (VASOTEC) 20 MG tablet TAKE 1 TABLET TWICE  DAILY 180 tablet 3  . fenofibrate micronized (LOFIBRA) 134 MG capsule Take 1 capsule (134 mg total) by mouth daily. 90 capsule 0  . ferrous sulfate 325 (65 FE) MG tablet Take 1 tablet (325 mg total) by mouth 2 (two) times daily with a meal. 60 tablet 3  . fish oil-omega-3 fatty acids 1000 MG capsule Take 2 g by mouth daily.      . Fluticasone-Salmeterol (ADVAIR) 250-50 MCG/DOSE AEPB INHALE ONE PUFF BY MOUTH DAILY AS NEEDED 60 each 5  . folic acid (FOLVITE) A999333 MCG tablet Take 400 mcg by mouth daily.      .  furosemide (LASIX) 40 MG tablet TAKE ONE TABLET BY MOUTH DAILY 90 tablet 4  . Insulin Glargine (LANTUS SOLOSTAR) 100 UNIT/ML Solostar Pen INJECT  17  TO  20 UNITS SUBCUTANEOUSLY EVERY MORNING 30 mL 10  . Insulin Pen Needle (B-D ULTRAFINE III SHORT PEN) 31G X 8 MM MISC USE AS DIRECTED WITH LANTUS SOLOSTAR 100 each 3  . KLOR-CON M20 20 MEQ tablet TAKE ONE TABLET BY MOUTH DAILY 90 tablet 0  . Melatonin 5 MG CAPS Take 1 capsule by mouth at bedtime.     . metFORMIN (GLUCOPHAGE) 1000 MG tablet TAKE 1 TABLET TWICE DAILY 180 tablet 1  . nitroGLYCERIN (NITROSTAT) 0.4 MG SL tablet PLACE 1 TABLET (0.4 MG TOTAL) UNDER THE TONGUE EVERY 5 (FIVE) MINUTES AS NEEDED FOR CHEST PAIN (CHEST PAIN OR SHORTNESS OF BREATH). 25 tablet 4  . pantoprazole (PROTONIX) 40 MG tablet TAKE ONE TABLET BY MOUTH DAILY 78 tablet 0  . Potassium Chloride ER 20 MEQ TBCR TAKE ONE TABLET BY MOUTH DAILY 30 tablet 10  . rosuvastatin (CRESTOR) 40 MG tablet TAKE 1 TABLET EVERY DAY 90 tablet 2  . sertraline (ZOLOFT) 25 MG tablet TAKE 1 TABLET EVERY DAY 90 tablet 3  . sodium chloride (OCEAN) 0.65 % SOLN nasal spray Place 1 spray into both nostrils as needed for congestion.    Marland Kitchen SPIRIVA HANDIHALER 18 MCG inhalation capsule PLACE 1 CAPSULE INTO HANDIHALER AND INHALE AS DIRECTED DAILY AT 2:00PM 90 capsule 1  . traMADol (ULTRAM) 50 MG tablet TAKE ONE TABLET BY MOUTH THREE TIMES A DAY AS NEEDED (30 DAY SUPPLY) 70 tablet 5  . triamcinolone cream (KENALOG) 0.1 % Apply 1 application topically 2 (two) times daily. 30 g 0  . vitamin E 400 UNIT capsule Take 400 Units by mouth daily.      . metoprolol tartrate (LOPRESSOR) 25 MG tablet  Prior to visit was on carvedilol 12.5 mg twice a day Take 1 tablet (25 mg total) by mouth 2 (two) times daily. 180 tablet 3   No current facility-administered medications for this visit.     Objective:  BP 130/78   Pulse 69   Temp 98.7 F (37.1 C) (Temporal)   Ht 5\' 5"  (1.651 m)   Wt 183 lb (83 kg)   SpO2 96%    BMI 30.45 kg/m  Gen: NAD, resting comfortably CV: RRR no murmurs rubs or gallops Lungs: CTAB no crackles, wheeze, rhonchi Ext: no edema Skin: warm, dry MSk: left wwrist ulnar portion with raised 2 x 2 cm area.  Nails: Prolonged nails on right foot-trimmed x3-second toenail was almost pointed-once this was trimmed no longer caught on sock and patient did not have pain with taking sock on or off-prior to this being trimmed had pain with taking sock off     Assessment and Plan   # wrist swelling and pian  S:patient noticed about two weeks ago left arm has some discomfort and knot at wrist. She denies any injury. Only hurts some times and goes away quickly.  A/P: suspect ganglion cyst- not worsening. Offered to refer to sports medicine but she wants to hold off for now as not severe   # Toe pain  S:when she was getting ready to come to appointment she put on a pair of shoes and noticed pain in right foot second toe. Pain was so bad she had to change shoes. She denies any fall or injury.  A/P: looks like she has a long nail that is getting caught- does not have anyone to cut right now- we trimmed this. The bone itself is normal. No obvious fracture  # CHF  S:controlled on Lasix 40 mg daily.  Also takes potassium with this.  No edema or increased weight gain.  No increased shortness of breath but she is rather sedentary A/P:  Stable. Continue current medications.     # Diabetes S: Compliant withLantus 18 units and Metformin 1000 mg BIDCBGs- no low blood sugar reported. Exercise and diet-sedentary.  Weight is fortunately stable Lab Results  Component Value Date   HGBA1C 6.5 (H) 02/22/2019   HGBA1C 6.3 (H) 07/27/2018   HGBA1C 7.0 (H) 03/23/2018   A/P: Hopefully remains controlled-update A1c today  #hypertension S: compliant with amlodipine 10 mg, carvedilol 12.5 mg twice a day, Lasix 40 mg, enalapril 20 mg twice a day BP Readings from Last 3 Encounters:  07/23/19 130/78  05/31/19  138/62  02/22/19 136/72  A/P:  Stable. Continue current medications other than we changed to metoprolol from carvedilol due to better profile for COPD  # COPD  S: compliant with Advair and Spiriva.  She is not having use albuterol-looks like she needs a new refill based on prior dates of refills A/P:   Stable-continue current medications.  Refilled albuterol and explained how this is a rescue inhaler -We changed to metoprolol from carvedilol due to better profile for COPD  # Hyperlipidemia/CAD S:patient is compliant with rosuvastatin 40 mg, fenofibrate.  She is on Plavix and aspirin per cardiology.  Denies worsening chest pain (continues to use sparing nitroglycerin) or worsening shortness of breath A/P: CAD appears stable.  Continue current medications.  Continue statin and fenofibrate.  Update LDL today with target LDL under 70-actually did not get processed last visit as I thought it had  # Anxiety  S:doing well on low-dose Zoloft for anxiety. Was offered counseling at last visit not interested. PhQ9 done today with score of 3. Patient is having issues with sleep. Only getting a few hours a night. Has trouble falling asleep.  A/P: Reasonable control-continue Zoloft  #Knee osteoarthritis-continues on tramadol-willing to refill up to 6 months from today  Recommended follow up: Return in about 4 months (around 11/20/2019).  Will be due for physical 4 months after that  Lab/Order associations:   ICD-10-CM   1. Chronic diastolic CHF (congestive heart failure) ([facility]) Chronic I50.32   2. IDDM type 2  [id number] Basic metabolic panel   [id number] Direct LDL    Hemoglobin A1c  3. Hypertension associated with diabetes ([place]) Chronic E11.59    I10   4. Coronary artery disease involving native coronary artery of native heart with other form of angina pectoris ([place])  I25.118   5. Chronic obstructive pulmonary disease, unspecified COPD type ([place]) Chronic J44.9     Meds ordered this encounter  Medications   . metoprolol tartrate (LOPRESSOR)  25 MG tablet    Sig: Take 1 tablet (25 mg total) by mouth 2 (two) times daily.    Dispense:  180 tablet    Refill:  3  . albuterol (VENTOLIN HFA) 108 (90 Base) MCG/ACT inhaler    Sig: Inhale 1 puff into the lungs every 4 (four) hours as needed for wheezing or shortness of breath (RESCUE inhaler if advair not working).    Dispense:  18 g    Refill:  5   Return precautions advised.  [name], MD

## 2019-07-23 NOTE — Patient Instructions (Addendum)
Health Maintenance Due  Topic Date Due  . OPHTHALMOLOGY EXAM had in last 6 months - our team is requesting records 04/28/2018   When you receive metoprolol 25 mg from mail order- start this twice a day instead of carvedilol twice a day. This is supposed to be a little more gentle on the lungs  Please stop by lab before you go If you do not have mychart- we will call you about results within 5 business days of [place] receiving them.  If you have mychart- we will send your results within 3 business days of [place] receiving them.  If abnormal or we want to clarify a result, we will call or mychart you to make sure you receive the message.  If you have questions or concerns or don't hear within 5-7 days, please send [place] a message or call us.    Recommended follow up: Return in about 4 months (around 11/20/2019).

## 2019-07-23 NOTE — Assessment & Plan Note (Signed)
S: compliant with Advair and Spiriva.  She is not having use albuterol-looks like she needs a new refill based on prior dates of refills A/P:   Stable-continue current medications.  Refilled albuterol and explained how this is a rescue inhaler -We changed to metoprolol from carvedilol due to better profile for COPD

## 2019-07-23 NOTE — Assessment & Plan Note (Signed)
S: compliant with amlodipine 10 mg, carvedilol 12.5 mg twice a day, Lasix 40 mg, enalapril 20 mg twice a day BP Readings from Last 3 Encounters:  07/23/19 130/78  05/31/19 138/62  02/22/19 136/72  A/P:  Stable. Continue current medications other than we changed to metoprolol from carvedilol due to better profile for COPD

## 2019-07-27 ENCOUNTER — Other Ambulatory Visit: Payer: Self-pay | Admitting: Cardiovascular Disease

## 2019-07-31 ENCOUNTER — Telehealth: Payer: Self-pay | Admitting: Family Medicine

## 2019-07-31 NOTE — Telephone Encounter (Signed)
Patients son, [name] calls in wanting to know what the metoprolol 25mg  is replacing, he says his mother has forgotten why she is taking it.

## 2019-08-06 ENCOUNTER — Other Ambulatory Visit: Payer: Self-pay | Admitting: Cardiovascular Disease

## 2019-08-06 DIAGNOSIS — I2510 Atherosclerotic heart disease of native coronary artery without angina pectoris: Secondary | ICD-10-CM

## 2019-08-31 ENCOUNTER — Other Ambulatory Visit: Payer: Self-pay | Admitting: Family Medicine

## 2019-09-06 ENCOUNTER — Other Ambulatory Visit: Payer: Self-pay | Admitting: Family Medicine

## 2019-09-07 ENCOUNTER — Other Ambulatory Visit: Payer: Self-pay | Admitting: Cardiovascular Disease

## 2019-09-07 ENCOUNTER — Other Ambulatory Visit: Payer: Self-pay | Admitting: Family Medicine

## 2019-09-08 ENCOUNTER — Other Ambulatory Visit: Payer: Self-pay | Admitting: Family Medicine

## 2019-09-09 ENCOUNTER — Telehealth: Payer: Self-pay

## 2019-09-09 NOTE — Telephone Encounter (Signed)
MEDICATION:pantoprazole (PROTONIX) 40 MG tablet  PHARMACY: [facility] [address] [place] Phone:  [phone]  Fax:  [phone]      Comments: Lars Mage from [place] wanted to know could they this from  78 tablet to 90 tablet   Dispense: 78 tablet    **Let patient know to contact pharmacy at the end of the day to make sure medication is ready. **  ** Please notify patient to allow 48-72 hours to process**  **Encourage patient to contact the pharmacy for refills or they can request refills through [facility]**

## 2019-09-23 ENCOUNTER — Other Ambulatory Visit: Payer: Self-pay

## 2019-09-23 MED ORDER — LANTUS SOLOSTAR 100 UNIT/ML ~~LOC~~ SOPN: PEN_INJECTOR | SUBCUTANEOUS | 3 refills | Status: DC

## 2019-10-08 ENCOUNTER — Other Ambulatory Visit: Payer: Self-pay | Admitting: Family Medicine

## 2019-11-14 ENCOUNTER — Other Ambulatory Visit: Payer: Self-pay | Admitting: Family Medicine

## 2019-11-21 ENCOUNTER — Ambulatory Visit (INDEPENDENT_AMBULATORY_CARE_PROVIDER_SITE_OTHER): Payer: Medicare HMO | Admitting: Family Medicine

## 2019-11-21 ENCOUNTER — Other Ambulatory Visit: Payer: Self-pay

## 2019-11-21 ENCOUNTER — Encounter: Payer: Self-pay | Admitting: Family Medicine

## 2019-11-21 VITALS — BP 140/62 | HR 76 | Temp 98.5°F | Ht 65.0 in | Wt 183.0 lb

## 2019-11-21 DIAGNOSIS — I5032 Chronic diastolic (congestive) heart failure: Secondary | ICD-10-CM

## 2019-11-21 DIAGNOSIS — F411 Generalized anxiety disorder: Secondary | ICD-10-CM

## 2019-11-21 DIAGNOSIS — E119 Type 2 diabetes mellitus without complications: Secondary | ICD-10-CM

## 2019-11-21 DIAGNOSIS — Z794 Long term (current) use of insulin: Secondary | ICD-10-CM | POA: Diagnosis not present

## 2019-11-21 DIAGNOSIS — E1159 Type 2 diabetes mellitus with other circulatory complications: Secondary | ICD-10-CM | POA: Diagnosis not present

## 2019-11-21 DIAGNOSIS — M170 Bilateral primary osteoarthritis of knee: Secondary | ICD-10-CM

## 2019-11-21 DIAGNOSIS — I10 Essential (primary) hypertension: Secondary | ICD-10-CM

## 2019-11-21 MED ORDER — SERTRALINE HCL 50 MG PO TABS: 50.0000 mg | ORAL_TABLET | Freq: Every day | ORAL | 3 refills | Status: DC

## 2019-11-21 NOTE — Patient Instructions (Addendum)
Health Maintenance Due  Topic Date Due  . COVID-19 Vaccine (1)-call us when you get the final one tomorrow Never done    Please stop by lab before you go If you have mychart- we will send your results within 3 business days of [place] receiving them.  If you do not have mychart- we will call you about results within 5 business days of [place] receiving them.   Increased Zoloft to 50mg .  Could try Voltaren Gel for arthritis on knee up to 4 times a day.  Give [place] a call tomorrow with second shot information and the letter about the inhaler.  Start checking your blood pressures at home and update [place] with the reading with a goal of less than 140/90.

## 2019-11-21 NOTE — Progress Notes (Signed)
Phone [phone] In person visit   Subjective:   Michelle [name] is a 86 y.o. year old very pleasant female patient who presents for/with See problem oriented charting Chief Complaint  Patient presents with  . Follow-up    This visit occurred during the SARS-CoV-2 public health emergency.  Safety protocols were in place, including screening questions prior to the visit, additional usage of staff PPE, and extensive cleaning of exam room while observing appropriate contact time as indicated for disinfecting solutions.   Past Medical History-  Patient Active Problem List   Diagnosis Date Noted  . Chronic diastolic CHF (congestive heart failure) ([place]) 03/13/2014    Priority: High  . CAD- RCA PCI '90s, STEMI-RCA DES 02/18/14 03/05/2014    Priority: High  . IDDM type 2 03/26/2007    Priority: High  . Anemia, iron deficiency 10/23/2015    Priority: Medium  . Osteoarthritis, knee 05/13/2014    Priority: Medium  . Anxiety state 04/21/2014    Priority: Medium  . SOB (shortness of breath) 03/05/2014    Priority: Medium  . Hyperlipidemia associated with type 2 diabetes mellitus ([place]) 03/26/2007    Priority: Medium  . Hypertension associated with diabetes ([place]) 04/21/2006    Priority: Medium  . COPD (chronic obstructive pulmonary disease) ([place]) 04/21/2006    Priority: Medium  . Actinic keratosis 11/17/2017    Priority: Low  . CKD (chronic kidney disease), stage II 07/25/2014    Priority: Low  . Back pain, lumbosacral 10/10/2012    Priority: Low  . Chest pain 12/14/2014    Medications- reviewed and updated Current Outpatient Medications  Medication Sig Dispense Refill  . ACCU-CHEK FASTCLIX LANCETS MISC     . ACCU-CHEK SMARTVIEW test strip TEST BLOOD SUGAR THREE TIMES DAILY 300 strip 1  . acetaminophen (TYLENOL) 325 MG tablet Take 650 mg by mouth every 6 (six) hours as needed for mild pain. Reported on 10/23/2015    . albuterol (PROVENTIL) (2.5 MG/3ML) 0.083% nebulizer solution  TAKE 3ML (2.5MG  TOTAL) BY NEBULIZATION EVERY 6 HOURS AS NEEDED FOR WHEEZING OR FOR SHORTNESS OF BREATH 75 vial 4  . albuterol (VENTOLIN HFA) 108 (90 Base) MCG/ACT inhaler Inhale 1 puff into the lungs every 4 (four) hours as needed for wheezing or shortness of breath (RESCUE inhaler if advair not working). 18 g 5  . Alcohol Swabs (B-D SINGLE USE SWABS REGULAR) PADS USE  FOR  TESTING THREE TIMES DAILY 300 each 1  . amLODipine (NORVASC) 10 MG tablet TAKE 1 TABLET EVERY DAY 90 tablet 3  . aspirin 81 MG tablet Take 81 mg by mouth daily.     . Blood Glucose Calibration (ACCU-CHEK SMARTVIEW CONTROL) LIQD     . clopidogrel (PLAVIX) 75 MG tablet TAKE ONE TABLET BY MOUTH DAILY **PLEASE KEEP UPCOMING APPOINTMENT IN NOVEMBER WITH DOCTOR [facility] FOR FUTURE REFILLS 90 tablet 3  . enalapril (VASOTEC) 20 MG tablet TAKE 1 TABLET TWICE DAILY 180 tablet 3  . fenofibrate micronized (LOFIBRA) 134 MG capsule TAKE ONE CAPSULE BY MOUTH DAILY 90 capsule 3  . ferrous sulfate 325 (65 FE) MG tablet Take 1 tablet (325 mg total) by mouth 2 (two) times daily with a meal. 60 tablet 3  . fish oil-omega-3 fatty acids 1000 MG capsule Take 2 g by mouth daily.      . Fluticasone-Salmeterol (ADVAIR) 250-50 MCG/DOSE AEPB INHALE ONE PUFF BY MOUTH DAILY AS NEEDED 60 each 5  . folic acid (FOLVITE) A999333 MCG tablet Take 400 mcg by mouth  daily.      . furosemide (LASIX) 40 MG tablet TAKE ONE TABLET BY MOUTH DAILY 90 tablet 4  . insulin glargine (LANTUS SOLOSTAR) 100 UNIT/ML Solostar Pen INJECT  17  TO  20 UNITS SUBCUTANEOUSLY EVERY MORNING 90 mL 3  . Insulin Pen Needle (B-D ULTRAFINE III SHORT PEN) 31G X 8 MM MISC USE AS DIRECTED WITH LANTUS SOLOSTAR 100 each 3  . KLOR-CON M20 20 MEQ tablet TAKE ONE TABLET BY MOUTH DAILY 90 tablet 3  . Melatonin 5 MG CAPS Take 1 capsule by mouth at bedtime.     . metFORMIN (GLUCOPHAGE) 1000 MG tablet TAKE 1 TABLET TWICE DAILY 180 tablet 1  . metoprolol tartrate (LOPRESSOR) 25 MG tablet Take 1 tablet (25 mg  total) by mouth 2 (two) times daily. 180 tablet 3  . nitroGLYCERIN (NITROSTAT) 0.4 MG SL tablet PLACE 1 TABLET (0.4 MG TOTAL) UNDER THE TONGUE EVERY 5 (FIVE) MINUTESAS NEEDED FOR CHEST PAIN (CHEST PAIN OR SHORTNESS OF BREATH). 25 tablet 6  . pantoprazole (PROTONIX) 40 MG tablet TAKE ONE TABLET BY MOUTH DAILY 78 tablet 0  . Potassium Chloride ER 20 MEQ TBCR TAKE ONE TABLET BY MOUTH DAILY 30 tablet 10  . rosuvastatin (CRESTOR) 40 MG tablet TAKE 1 TABLET EVERY DAY 90 tablet 2  . sodium chloride (OCEAN) 0.65 % SOLN nasal spray Place 1 spray into both nostrils as needed for congestion.    Marland Kitchen SPIRIVA HANDIHALER 18 MCG inhalation capsule PLACE 1 CAPSULE INTO HANDIHALER AND INHALE CONTENTS OF 1 CAPSULE EVERY DAY AS DIRECTED AT 2 PM 90 capsule 1  . traMADol (ULTRAM) 50 MG tablet TAKE ONE TABLET BY MOUTH THREE TIMES A DAY AS NEEDED 70 tablet 4  . triamcinolone cream (KENALOG) 0.1 % Apply 1 application topically 2 (two) times daily. 30 g 0  . vitamin E 400 UNIT capsule Take 400 Units by mouth daily.      . sertraline (ZOLOFT) 50 MG tablet Take 1 tablet (50 mg total) by mouth at bedtime. 90 tablet 3   No current facility-administered medications for this visit.     Objective:  BP 140/62   Pulse 76   Temp 98.5 F (36.9 C)   Ht 5\' 5"  (1.651 m)   Wt 183 lb (83 kg)   SpO2 97%   BMI 30.45 kg/m  Gen: NAD, resting comfortably CV: RRR no murmurs rubs or gallops Lungs: CTAB no crackles, wheeze, rhonchi Abdomen: soft/nontender/nondistended/normal bowel sounds.  Ext: no edema Skin: warm, dry Neuro: In wheelchair.  She does walk at home some with a walker-encouraged her to do this is much as possible/tolerable with the knees    Assessment and Plan  #anxiety/Nervousness S: Per nursing "pt c/o nervous spells that dont happen often but when they do happen she gets so worked up she cant do anything and something is making her face itch and she is not sure if its coming from her mediations or what.   "  Patient with anxiety issues after MI in the past.  Started on Zoloft 25 mg and previously had done well with this.  QT interval has not been prolonged with cardiology  She has declined counseling in the past. No obvious trigger. No chest pain or shortness of breath.  A/P: Patient previously with reasonable control of anxiety on Zoloft 25 mg-has worsened recently.  We are going to increase from Zoloft 25 mg to 50 mg and follow-up in 3 months to see if this has been beneficial.  We also could consider an as needed medication such as buspirone. - wants to hold off on counseling- doesn't like virtual option  # Diastolic CHF S:Patient controlled on Lasix 40 mg daily.  Weights stable since last visit.  No increased edema or shortness of breath   A/P: stable- continue current medicines. Update CMP today   # Diabetes S: Medication: Lantus 18 units and Metformin 1000 mg twice daily. CBGs- no low blood sugars reported. Exercise and diet- stable weight-encouraged even doing chair exercises Lab Results  Component Value Date   HGBA1C 6.5 07/23/2019   HGBA1C 6.5 (H) 02/22/2019   HGBA1C 6.3 (H) 07/27/2018   A/P: Hopefully diabetes is controlled-update hemoglobin A1c with labs today    #hypertension S: medication: Amlodipine 10 mg, Toprol 25 mg twice daily, Lasix 40 mg daily, enalapril 20 mg twice a day Home readings #s: not recently checking BP Readings from Last 3 Encounters:  11/21/19 140/62  07/23/19 130/78  05/31/19 138/62  A/P: Initial blood pressure elevated and similar on repeat- in past has been well controlled- she is going to do some home monitoring and we may adjust if needed- has been controlled in past so dont want to overreact on one reading   #Cad # hyperlipidemia S: Medication:Compliant with rosuvastatin 40 mg and fenofibrate.  She remains on Plavix and aspirin per cardiology.  Patient rather sedentary but denies worsening shortness of breath.  Still sparingly using  nitroglycerin sparingly  Lab Results  Component Value Date   CHOL 134 02/22/2019   HDL 41 (L) 02/22/2019   LDLDIRECT 40.0 07/23/2019   TRIG 436 (H) 02/22/2019   CHOLHDL 3.3 02/22/2019   A/P: CAD appears stable-continue current medications. HLD has been at goal with last LDL 40 though triglycerides were high- will recheck full lipids next visit   #Osteoarthritis of the knees S: Patient continues to get reasonable control with tramadol - injections have not helped. Copper bracelet  A/P: reasonable control- not a good candidate for surgery  -advised trial voltaren gel/diclofenac gel   #COPD-we may have to change inhalers-she has a letter from her insurance about 1 medication is not being covered-I also wonder if this could be a switch in formulation of albuterol.  I would be okay changing to ProAir respite click or away from this if needed  Recommended follow up: Return in about 3 months (around 02/21/2020) for CPE.  Lab/Order associations:   ICD-10-CM   1. Anxiety state  F41.1   2. Chronic diastolic CHF (congestive heart failure) (HCC)  I50.32   3. Diabetes mellitus type 2, insulin dependent (HCC)  E11.9 CBC   Z79.4 Comprehensive metabolic panel    Hemoglobin A1c  4. Primary osteoarthritis of both knees  M17.0   5. Hypertension associated with diabetes ([place])  E11.59    I10     Meds ordered this encounter  Medications  . sertraline (ZOLOFT) 50 MG tablet    Sig: Take 1 tablet (50 mg total) by mouth at bedtime.    Dispense:  90 tablet    Refill:  3   Return precautions advised.  [name], MD

## 2019-11-22 ENCOUNTER — Telehealth: Payer: Self-pay | Admitting: Family Medicine

## 2019-11-22 LAB — COMPREHENSIVE METABOLIC PANEL
ALT: 12 U/L (ref 0–35)
AST: 14 U/L (ref 0–37)
Albumin: 4.2 g/dL (ref 3.5–5.2)
Alkaline Phosphatase: 50 U/L (ref 39–117)
BUN: 31 mg/dL — ABNORMAL HIGH (ref 6–23)
CO2: 27 mEq/L (ref 19–32)
Calcium: 10.3 mg/dL (ref 8.4–10.5)
Chloride: 104 mEq/L (ref 96–112)
Creatinine, Ser: 0.83 mg/dL (ref 0.40–1.20)
GFR: 65.35 mL/min (ref >60.00)
Glucose, Bld: 163 mg/dL — ABNORMAL HIGH (ref 70–99)
Potassium: 4 mEq/L (ref 3.5–5.1)
Sodium: 138 mEq/L (ref 135–145)
Total Bilirubin: 0.3 mg/dL (ref 0.2–1.2)
Total Protein: 6.9 g/dL (ref 6.0–8.3)

## 2019-11-22 LAB — CBC
HCT: 38.7 % (ref 36.0–46.0)
Hemoglobin: 13.1 g/dL (ref 12.0–15.0)
MCHC: 33.9 g/dL (ref 30.0–36.0)
MCV: 89.1 fl (ref 78.0–100.0)
Platelets: 220 10*3/uL (ref 150.0–400.0)
RBC: 4.34 Mil/uL (ref 3.87–5.11)
RDW: 13.2 % (ref 11.5–15.5)
WBC: 7.7 10*3/uL (ref 4.0–10.5)

## 2019-11-22 LAB — HEMOGLOBIN A1C: Hgb A1c MFr Bld: 6.6 % — ABNORMAL HIGH (ref 4.6–6.5)

## 2019-11-22 MED ORDER — FUROSEMIDE 40 MG PO TABS: 40.0000 mg | ORAL_TABLET | Freq: Every day | ORAL | 3 refills | Status: DC

## 2019-11-22 MED ORDER — FLUTICASONE-SALMETEROL 250-50 MCG/DOSE IN AEPB: INHALATION_SPRAY | RESPIRATORY_TRACT | 5 refills | Status: DC

## 2019-11-22 MED ORDER — BD PEN NEEDLE SHORT U/F 31G X 8 MM MISC: 3 refills | Status: DC

## 2019-11-22 NOTE — Telephone Encounter (Signed)
Medication sent in. 

## 2019-11-22 NOTE — Telephone Encounter (Signed)
MEDICATION: fluticasone-salmeterol 250-50 MCG, B-D ultrafine III short pen needle, furosemide 40 MG  PHARMACY: [facility] [address]  Comments: 90 day supply preferred  **Let patient know to contact pharmacy at the end of the day to make sure medication is ready. **  ** Please notify patient to allow 48-72 hours to process**  **Encourage patient to contact the pharmacy for refills or they can request refills through [facility]**

## 2019-11-26 ENCOUNTER — Other Ambulatory Visit: Payer: Self-pay

## 2019-11-26 MED ORDER — BD PEN NEEDLE SHORT U/F 31G X 8 MM MISC: 3 refills | Status: DC

## 2019-12-05 ENCOUNTER — Other Ambulatory Visit: Payer: Self-pay | Admitting: Family Medicine

## 2019-12-12 ENCOUNTER — Other Ambulatory Visit: Payer: Self-pay | Admitting: Family Medicine

## 2019-12-17 ENCOUNTER — Other Ambulatory Visit: Payer: Self-pay

## 2019-12-17 MED ORDER — PANTOPRAZOLE SODIUM 40 MG PO TBEC: 40.0000 mg | DELAYED_RELEASE_TABLET | Freq: Every day | ORAL | 0 refills | Status: DC

## 2020-01-20 ENCOUNTER — Other Ambulatory Visit: Payer: Self-pay | Admitting: Family Medicine

## 2020-01-30 ENCOUNTER — Other Ambulatory Visit: Payer: Self-pay

## 2020-01-30 MED ORDER — TRAMADOL HCL 50 MG PO TABS: ORAL_TABLET | ORAL | 4 refills | Status: DC

## 2020-01-30 NOTE — Telephone Encounter (Signed)
LAST APPOINTMENT DATE: 01/20/2020   NEXT APPOINTMENT DATE: 02/18/2020    LAST REFILL: 09/09/2019   QTY: #[id number]

## 2020-01-31 ENCOUNTER — Other Ambulatory Visit: Payer: Self-pay | Admitting: Family Medicine

## 2020-02-06 ENCOUNTER — Other Ambulatory Visit: Payer: Self-pay | Admitting: *Deleted

## 2020-02-06 DIAGNOSIS — I2510 Atherosclerotic heart disease of native coronary artery without angina pectoris: Secondary | ICD-10-CM

## 2020-02-06 MED ORDER — CLOPIDOGREL BISULFATE 75 MG PO TABS: ORAL_TABLET | ORAL | 1 refills | Status: DC

## 2020-02-06 MED ORDER — PANTOPRAZOLE SODIUM 40 MG PO TBEC: 40.0000 mg | DELAYED_RELEASE_TABLET | Freq: Every day | ORAL | 1 refills | Status: DC

## 2020-02-11 NOTE — Progress Notes (Signed)
Phone [phone]   Subjective:  Patient presents today for their follow up- was too early for physical. Chief complaint-noted.   See problem oriented charting- ROS- no chest pain. No edema. Stable shortness of breath.   The following were reviewed and entered/updated in epic: Past Medical History:  Diagnosis Date  . Anginal pain ([place])   . Arthritis   . AV block, 1st degree   . CAD (coronary artery disease) 1990; 2015   Cardiac cath 1990 with Dr. Lia Foyer and [name] Residual CAD, non obstructive per 2015 cath. STEMI status post stent in August 2015.  Marland Kitchen COPD (chronic obstructive pulmonary disease) ([place])   . Diabetes mellitus type 2, insulin dependent ([place])   . Essential hypertension   . Hyperlipidemia with target LDL less than 70   . Myocardial infarction ([place])   . Pericarditis-post MI (short course of steroids) 03/06/2014  . 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 Shortness of breath    Patient Active Problem List   Diagnosis Date Noted  . Chronic diastolic CHF (congestive heart failure) ([place]) 03/13/2014    Priority: High  . CAD- RCA PCI '90s, STEMI-RCA DES 02/18/14 03/05/2014    Priority: High  . IDDM type 2 03/26/2007    Priority: High  . Anemia, iron deficiency 10/23/2015    Priority: Medium  . Osteoarthritis, knee 05/13/2014    Priority: Medium  . Anxiety state 04/21/2014    Priority: Medium  . SOB (shortness of breath) 03/05/2014    Priority: Medium  . Hyperlipidemia associated with type 2 diabetes mellitus ([place]) 03/26/2007    Priority: Medium  . Hypertension associated with diabetes ([place]) 04/21/2006    Priority: Medium  . COPD (chronic obstructive pulmonary disease) ([place]) 04/21/2006    Priority: Medium  . Actinic keratosis 11/17/2017     Priority: Low  . CKD (chronic kidney disease), stage II 07/25/2014    Priority: Low  . Back pain, lumbosacral 10/10/2012    Priority: Low  . Chest pain 12/14/2014   Past Surgical History:  Procedure Laterality Date  . CATARACT EXTRACTION  2020   right and left eye   . CHOLECYSTECTOMY    . CORONARY ANGIOPLASTY WITH STENT PLACEMENT  02/18/14   Promus DES to RCA  . LEFT HEART CATHETERIZATION WITH CORONARY ANGIOGRAM N/A 02/18/2014   Procedure: LEFT HEART CATHETERIZATION WITH CORONARY ANGIOGRAM;  Surgeon: Jettie Booze, MD;  Location: [facility] CATH LAB;  Service: Cardiovascular;  Laterality: N/A;  . PTCA  1990   PTCA of RCA  . TRANSTHORACIC ECHOCARDIOGRAM  02/02/2012   mild LVH, EF 55-60%, Normal WM, Gr 1 DD; Mild MR    Family History  Problem Relation Age of Onset  . Heart attack Mother 29  . Cancer Father 2  . Cancer Maternal Grandfather   . Cancer Paternal Grandmother   . Cancer Paternal Grandfather   . Diabetes Son   . Liver cancer Brother   . Bone cancer Brother   . Heart attack Son   . Hypertension Son   . Diabetes Son   . Hyperlipidemia Son     Medications- reviewed and updated Current Outpatient Medications  Medication Sig Dispense Refill  . ACCU-CHEK FASTCLIX LANCETS MISC     . ACCU-CHEK SMARTVIEW test  strip TEST BLOOD SUGAR THREE TIMES DAILY 300 strip 1  . acetaminophen (TYLENOL) 325 MG tablet Take 650 mg by mouth every 6 (six) hours as needed for mild pain. Reported on 10/23/2015    . albuterol (PROVENTIL) (2.5 MG/3ML) 0.083% nebulizer solution USE THREE MILLILITERS VIA NEBULIZATION BY MOUTH EVERY 6 HOURS AS NEEDED FOR WHEEZING OR SHORTNESS OF BREATH 75 mL 3  . albuterol (VENTOLIN HFA) 108 (90 Base) MCG/ACT inhaler Inhale 1 puff into the lungs every 4 (four) hours as needed for wheezing or shortness of breath (RESCUE inhaler if advair not working). 18 g 5  . Alcohol Swabs (B-D SINGLE USE SWABS REGULAR) PADS USE  FOR  TESTING THREE TIMES DAILY 300 each 1  .  amLODipine (NORVASC) 10 MG tablet TAKE 1 TABLET EVERY DAY 90 tablet 3  . aspirin 81 MG tablet Take 81 mg by mouth daily.     . Blood Glucose Calibration (ACCU-CHEK SMARTVIEW CONTROL) LIQD     . clopidogrel (PLAVIX) 75 MG tablet TAKE ONE TABLET BY MOUTH DAILY **PLEASE KEEP UPCOMING APPOINTMENT IN NOVEMBER WITH DOCTOR [facility] FOR FUTURE REFILLS 90 tablet 1  . enalapril (VASOTEC) 20 MG tablet TAKE 1 TABLET TWICE DAILY 180 tablet 3  . fenofibrate micronized (LOFIBRA) 134 MG capsule TAKE ONE CAPSULE BY MOUTH DAILY 90 capsule 3  . ferrous sulfate 325 (65 FE) MG tablet Take 1 tablet (325 mg total) by mouth 2 (two) times daily with a meal. 60 tablet 3  . fish oil-omega-3 fatty acids 1000 MG capsule Take 2 g by mouth daily.      . Fluticasone-Salmeterol (ADVAIR) 250-50 MCG/DOSE AEPB INHALE ONE PUFF BY MOUTH DAILY AS NEEDED 60 each 5  . folic acid (FOLVITE) 696 MCG tablet Take 400 mcg by mouth daily.      . furosemide (LASIX) 40 MG tablet TAKE ONE TABLET BY MOUTH DAILY 90 tablet 3  . insulin glargine (LANTUS SOLOSTAR) 100 UNIT/ML Solostar Pen INJECT  17  TO  20 UNITS SUBCUTANEOUSLY EVERY MORNING 90 mL 3  . Insulin Pen Needle (B-D ULTRAFINE III SHORT PEN) 31G X 8 MM MISC USE AS DIRECTED WITH LANTUS SOLOSTAR dx. e11.9 100 each 3  . KLOR-CON M20 20 MEQ tablet TAKE ONE TABLET BY MOUTH DAILY 90 tablet 3  . Melatonin 5 MG CAPS Take 1 capsule by mouth at bedtime.     . metFORMIN (GLUCOPHAGE) 1000 MG tablet TAKE 1 TABLET TWICE DAILY 180 tablet 1  . metoprolol tartrate (LOPRESSOR) 25 MG tablet Take 1 tablet (25 mg total) by mouth 2 (two) times daily. 180 tablet 3  . nitroGLYCERIN (NITROSTAT) 0.4 MG SL tablet PLACE 1 TABLET (0.4 MG TOTAL) UNDER THE TONGUE EVERY 5 (FIVE) MINUTESAS NEEDED FOR CHEST PAIN (CHEST PAIN OR SHORTNESS OF BREATH). 25 tablet 6  . pantoprazole (PROTONIX) 40 MG tablet Take 1 tablet (40 mg total) by mouth daily. 90 tablet 1  . Potassium Chloride ER 20 MEQ TBCR TAKE ONE TABLET BY MOUTH DAILY 30  tablet 10  . rosuvastatin (CRESTOR) 40 MG tablet TAKE 1 TABLET EVERY DAY 90 tablet 2  . sertraline (ZOLOFT) 50 MG tablet Take 1 tablet (50 mg total) by mouth at bedtime. 90 tablet 3  . sodium chloride (OCEAN) 0.65 % SOLN nasal spray Place 1 spray into both nostrils as needed for congestion.    Marland Kitchen SPIRIVA HANDIHALER 18 MCG inhalation capsule PLACE 1 CAPSULE INTO HANDIHALER AND INHALE CONTENTS OF 1 CAPSULE EVERY DAY AS DIRECTED AT 2 PM 90  capsule 1  . traMADol (ULTRAM) 50 MG tablet TAKE ONE TABLET BY MOUTH THREE TIMES A DAY AS NEEDED 70 tablet 4  . triamcinolone cream (KENALOG) 0.1 % Apply 1 application topically 2 (two) times daily. 30 g 0  . vitamin E 400 UNIT capsule Take 400 Units by mouth daily.       No current facility-administered medications for this visit.    Allergies-reviewed and updated Allergies  Allergen Reactions  . Codeine Sulfate Nausea Only  . Morphine Sulfate Nausea Only    Social History   Social History Narrative   Lives with her son but he works all day. Home for dinner.  Independent ADL. Needs assist on some IADL.    Objective  Objective:  BP 130/70   Pulse 68   Temp 98.3 F (36.8 C)   Ht 5\' 5"  (1.651 m)   Wt 183 lb (83 kg)   SpO2 97%   BMI 30.45 kg/m  Gen: NAD, resting comfortably, son present for visit CV: RRR no murmurs rubs or gallops Lungs: CTAB no crackles, wheeze, rhonchi. Prolonged expiratory phase Ext: no edema Skin: warm, dry Neuro: In wheelchair   Assessment and Plan   Hypertension associated with diabetes (HCC)-compliant with amlodipine 10 mg daily, enalapril 20 mg, metoprolol 25 mg twice daily, lasix 40mg  daily. Well controlled today. Home BP slightly high- encouraged her to bring cuff to next visit   Hyperlipidemia associated with type 2 diabetes mellitus (HCC)-compliant with fenofibrate, rosuvastatin 40 mg daily.  LDL goal under 70-update lipid panel today Lab Results  Component Value Date   CHOL 134 02/22/2019   HDL 41 (L)  02/22/2019   [place]  02/22/2019     Comment:     . LDL cholesterol not calculated. Triglyceride levels greater than 400 mg/dL invalidate calculated LDL results. . Reference range: <100 . Desirable range <100 mg/dL for primary prevention;   <70 mg/dL for patients with CHD or diabetic patients  with > or = 2 CHD risk factors. Marland Kitchen LDL-C is now calculated using the Martin-Hopkins  calculation, which is a validated novel method providing  better accuracy than the Friedewald equation in the  estimation of LDL-C.  Cresenciano Genre et al. Annamaria Helling. 3662;947(65): 2061-2068  (http://education.[facility].com/faq/FAQ164)    LDLDIRECT 40.0 07/23/2019   TRIG 436 (H) 02/22/2019   CHOLHDL 3.3 02/22/2019     Anxiety state-reasonable control with sertraline 50 mg-continue current medication   IDDM type 2-compliant with Lantus 17 to 20 units every morning, Metformin 1000 mg twice daily. CBGs usually  120s or 130s- no lows noted- occasiaonlly slightly higher or lower than this range though. . Hopefully controlled-update a1c Lab Results  Component Value Date   HGBA1C 6.6 (H) 11/21/2019   Chronic diastolic CHF (congestive heart failure) (HCC)-compliant with Lasix 40 mg daily.  Weight stable at home.  No increased edema   Coronary artery disease involving native coronary artery of native heart with other form of angina pectoris (HCC)-compliant with aspirin 81 mg and Plavix 75 mg- overlapping covered stent and drug eluting in RCA so needs DAPT. Marland Kitchen No chest pain. Some shortness of breath with COPD.   Primary osteoarthritis of both knees-tramadol helps her tolerate pain-just refilled last month- but she has had increasing need and wants flexibility to use 3 times a day more regularly if needed-we increase prescription to number 90 tablets  Chronic obstructive pulmonary disease, unspecified COPD type (HCC)-compliant with Spiriva daily, Advair daily as needed-discussed typically would use twice a day with any  increased breathing issues. She has been using albuterol somewhat more and son remembers this being the case before she was hospitalized with breathing issues int he past . We are going to change this to twice daily advair and is suspect this will cut down on albuterol which may be a trigger for shakiness.   Iron deficiency anemia, unspecified iron deficiency anemia type-compliant with ferrous sulfate twice daily.  We will check a ferritin level-I suspect we may be able to reduce this to twice a week  GERD-uses Protonix 40 mg daily-reasonable control .  B12 normal about a year ago but low normal-we will check again today  Lab Results  Component Value Date   VITAMINB12 268 02/22/2019    Notices a sensation of crawling on her face at times- most of the time.  Recommended follow up: 4 month physical  Lab/Order associations: not fasting   ICD-10-CM   1. Hypertension associated with diabetes ([place])  E11.59    I10   2. Preventative health care  Z00.00   3. Hyperlipidemia associated with type 2 diabetes mellitus ([place])  E11.69    E78.5   4. Anxiety state  F41.1   5. IDDM type 2  E11.9    Z79.4   6. Chronic diastolic CHF (congestive heart failure) (HCC)  I50.32   7. Coronary artery disease involving native coronary artery of native heart with other form of angina pectoris ([place])  I25.118   8. Primary osteoarthritis of both knees  M17.0   9. Chronic obstructive pulmonary disease, unspecified COPD type ([place])  J44.9   10. Iron deficiency anemia, unspecified iron deficiency anemia type  D50.9     No orders of the defined types were placed in this encounter.   Return precautions advised.  [name], MD

## 2020-02-11 NOTE — Patient Instructions (Addendum)
Please stop by lab before you go If you have mychart- we will send your results within 3 business days of [place] receiving them.  If you do not have mychart- we will call you about results within 5 business days of [place] receiving them.  *please note we are currently using [facility] which has a longer processing time than [place] typically so labs may not come back as quickly as in the past *please also note that you will see labs on mychart as soon as they post. I will later go in and write notes on them- will say "notes from Dr. Yong Channel"  Bring your home blood pressure cuff to your next visit.

## 2020-02-18 ENCOUNTER — Other Ambulatory Visit: Payer: Self-pay

## 2020-02-18 ENCOUNTER — Encounter: Payer: Self-pay | Admitting: Family Medicine

## 2020-02-18 ENCOUNTER — Ambulatory Visit (INDEPENDENT_AMBULATORY_CARE_PROVIDER_SITE_OTHER): Payer: Medicare HMO | Admitting: Family Medicine

## 2020-02-18 VITALS — BP 130/70 | HR 68 | Temp 98.3°F | Ht 65.0 in | Wt 183.0 lb

## 2020-02-18 DIAGNOSIS — M170 Bilateral primary osteoarthritis of knee: Secondary | ICD-10-CM | POA: Diagnosis not present

## 2020-02-18 DIAGNOSIS — Z0000 Encounter for general adult medical examination without abnormal findings: Secondary | ICD-10-CM | POA: Diagnosis not present

## 2020-02-18 DIAGNOSIS — F411 Generalized anxiety disorder: Secondary | ICD-10-CM

## 2020-02-18 DIAGNOSIS — D509 Iron deficiency anemia, unspecified: Secondary | ICD-10-CM

## 2020-02-18 DIAGNOSIS — E119 Type 2 diabetes mellitus without complications: Secondary | ICD-10-CM

## 2020-02-18 DIAGNOSIS — E1169 Type 2 diabetes mellitus with other specified complication: Secondary | ICD-10-CM

## 2020-02-18 DIAGNOSIS — Z79899 Other long term (current) drug therapy: Secondary | ICD-10-CM | POA: Diagnosis not present

## 2020-02-18 DIAGNOSIS — Z794 Long term (current) use of insulin: Secondary | ICD-10-CM

## 2020-02-18 DIAGNOSIS — E785 Hyperlipidemia, unspecified: Secondary | ICD-10-CM

## 2020-02-18 DIAGNOSIS — I10 Essential (primary) hypertension: Secondary | ICD-10-CM

## 2020-02-18 DIAGNOSIS — I5032 Chronic diastolic (congestive) heart failure: Secondary | ICD-10-CM

## 2020-02-18 DIAGNOSIS — E1159 Type 2 diabetes mellitus with other circulatory complications: Secondary | ICD-10-CM | POA: Diagnosis not present

## 2020-02-18 DIAGNOSIS — I25118 Atherosclerotic heart disease of native coronary artery with other forms of angina pectoris: Secondary | ICD-10-CM | POA: Diagnosis not present

## 2020-02-18 DIAGNOSIS — J449 Chronic obstructive pulmonary disease, unspecified: Secondary | ICD-10-CM

## 2020-02-18 MED ORDER — TRAMADOL HCL 50 MG PO TABS: ORAL_TABLET | ORAL | 5 refills | Status: DC

## 2020-02-19 LAB — CBC WITH DIFFERENTIAL/PLATELET
Absolute Monocytes: 748 cells/uL (ref 200–950)
Basophils Absolute: 70 cells/uL (ref 0–200)
Basophils Relative: 0.8 %
Eosinophils Absolute: 139 cells/uL (ref 15–500)
Eosinophils Relative: 1.6 %
HCT: 40.9 % (ref 35.0–45.0)
Hemoglobin: 13.4 g/dL (ref 11.7–15.5)
Lymphs Abs: 2384 cells/uL (ref 850–3900)
MCH: 29.2 pg (ref 27.0–33.0)
MCHC: 32.8 g/dL (ref 32.0–36.0)
MCV: 89.1 fL (ref 80.0–100.0)
MPV: 13.3 fL — ABNORMAL HIGH (ref 7.5–12.5)
Monocytes Relative: 8.6 %
Neutro Abs: 5359 cells/uL (ref 1500–7800)
Neutrophils Relative %: 61.6 %
Platelets: 256 10*3/uL (ref 140–400)
RBC: 4.59 10*6/uL (ref 3.80–5.10)
RDW: 12.6 % (ref 11.0–15.0)
Total Lymphocyte: 27.4 %
WBC: 8.7 10*3/uL (ref 3.8–10.8)

## 2020-02-19 LAB — COMPREHENSIVE METABOLIC PANEL
AG Ratio: 1.5 (calc) (ref 1.0–2.5)
ALT: 10 U/L (ref 6–29)
AST: 14 U/L (ref 10–35)
Albumin: 4.1 g/dL (ref 3.6–5.1)
Alkaline phosphatase (APISO): 47 U/L (ref 37–153)
BUN: 21 mg/dL (ref 7–25)
CO2: 28 mmol/L (ref 20–32)
Calcium: 9.5 mg/dL (ref 8.6–10.4)
Chloride: 104 mmol/L (ref 98–110)
Creat: 0.77 mg/dL (ref 0.60–0.88)
Globulin: 2.8 g/dL (calc) (ref 1.9–3.7)
Glucose, Bld: 110 mg/dL — ABNORMAL HIGH (ref 65–99)
Potassium: 3.8 mmol/L (ref 3.5–5.3)
Sodium: 139 mmol/L (ref 135–146)
Total Bilirubin: 0.3 mg/dL (ref 0.2–1.2)
Total Protein: 6.9 g/dL (ref 6.1–8.1)

## 2020-02-19 LAB — HEMOGLOBIN A1C
Hgb A1c MFr Bld: 6.6 % of total Hgb — ABNORMAL HIGH (ref <5.7)
Mean Plasma Glucose: 143 (calc)
eAG (mmol/L): 7.9 (calc)

## 2020-02-19 LAB — LIPID PANEL
Cholesterol: 128 mg/dL (ref <200)
HDL: 49 mg/dL — ABNORMAL LOW (ref >=50)
LDL Cholesterol (Calc): 42 mg/dL (calc)
Non-HDL Cholesterol (Calc): 79 mg/dL (calc) (ref <130)
Total CHOL/HDL Ratio: 2.6 (calc) (ref <5.0)
Triglycerides: 355 mg/dL — ABNORMAL HIGH (ref <150)

## 2020-02-19 LAB — VITAMIN B12: Vitamin B-12: 218 pg/mL (ref 200–1100)

## 2020-02-19 LAB — IRON,TIBC AND FERRITIN PANEL
%SAT: 12 % (calc) — ABNORMAL LOW (ref 16–45)
Ferritin: 55 ng/mL (ref 16–288)
Iron: 52 ug/dL (ref 45–160)
TIBC: 429 mcg/dL (calc) (ref 250–450)

## 2020-02-21 ENCOUNTER — Other Ambulatory Visit: Payer: Self-pay

## 2020-02-21 ENCOUNTER — Telehealth: Payer: Self-pay

## 2020-02-21 MED ORDER — FERROUS SULFATE 325 (65 FE) MG PO TABS: ORAL_TABLET | ORAL | 3 refills | Status: DC

## 2020-02-21 MED ORDER — FLUTICASONE-SALMETEROL 250-50 MCG/DOSE IN AEPB: INHALATION_SPRAY | RESPIRATORY_TRACT | 5 refills | Status: DC

## 2020-02-21 NOTE — Telephone Encounter (Signed)
Refill has been sent to the correct pharmacy  

## 2020-02-21 NOTE — Telephone Encounter (Signed)
.   LAST APPOINTMENT DATE: 02/18/2020   NEXT APPOINTMENT DATE:@12 /03/2020  MEDICATION:Fluticasone-Salmeterol (ADVAIR) 250-50 MCG/DOSE AEPB  albuterol (VENTOLIN HFA) 108 (90 Base) MCG/ACT inhaler    PHARMACY: [facility], [place]  Patient never received these from [facility] and is asking for them to be resent in to [place] in hopes they receive them this time.

## 2020-03-10 ENCOUNTER — Telehealth: Payer: Self-pay | Admitting: Family Medicine

## 2020-03-10 NOTE — Progress Notes (Signed)
°  Chronic Care Management   Outreach Note  03/10/2020 Name: Michelle [name] MRN: [id number] DOB: Feb 02, 1933  Referred by: [name], MD Reason for referral : No chief complaint on file.   An unsuccessful telephone outreach was attempted today. The patient was referred to the pharmacist for assistance with care management and care coordination.   Follow Up Plan:   Earney Hamburg Upstream Scheduler

## 2020-03-18 ENCOUNTER — Telehealth: Payer: Self-pay | Admitting: Family Medicine

## 2020-03-18 NOTE — Progress Notes (Signed)
  Chronic Care Management   Note  03/18/2020 Name: Michelle [name] MRN: [id number] DOB: May 05, 1933  Michelle [name] is a 86 y.o. year old female who is a primary care patient of Yong Channel, Brayton Mars, MD. I reached out to [name] by phone today in response to a referral sent by [name]'s PCP, [name], MD.   [name] was given information about Chronic Care Management services today including:  1. CCM service includes personalized support from designated clinical staff supervised by her physician, including individualized plan of care and coordination with other care providers 2. 24/7 contact phone numbers for assistance for urgent and routine care needs. 3. Service will only be billed when office clinical staff spend 20 minutes or more in a month to coordinate care. 4. Only one practitioner may furnish and bill the service in a calendar month. 5. The patient may stop CCM services at any time (effective at the end of the month) by phone call to the office staff.   Patient agreed to services and verbal consent obtained.   Follow up plan:   Earney Hamburg Upstream Scheduler

## 2020-03-20 ENCOUNTER — Other Ambulatory Visit: Payer: Self-pay | Admitting: Family Medicine

## 2020-04-02 ENCOUNTER — Other Ambulatory Visit: Payer: Self-pay | Admitting: Family Medicine

## 2020-04-06 ENCOUNTER — Telehealth: Payer: Self-pay

## 2020-04-06 NOTE — Telephone Encounter (Signed)
Pt.'s furosemide (LASIX) 40 MG tablet Will not be sent from [facility] for another 7 days according to pt.'s son. He is requesting we send enough tablets to hold the pt over. She took her last pill today.   Send temporary prescription to this pharmacy: [facility] [address] [place]

## 2020-04-07 ENCOUNTER — Other Ambulatory Visit: Payer: Self-pay

## 2020-04-07 MED ORDER — FUROSEMIDE 40 MG PO TABS
40.0000 mg | ORAL_TABLET | Freq: Every day | ORAL | 0 refills | Status: DC
Start: 2020-04-07 — End: 2020-09-01

## 2020-04-07 NOTE — Telephone Encounter (Signed)
Called and lm on pt son vm making him aware that Rx has been sent to local pharmacy.

## 2020-04-09 ENCOUNTER — Other Ambulatory Visit: Payer: Self-pay | Admitting: Family Medicine

## 2020-04-16 ENCOUNTER — Other Ambulatory Visit: Payer: Self-pay | Admitting: Family Medicine

## 2020-05-04 ENCOUNTER — Other Ambulatory Visit: Payer: Self-pay | Admitting: Family Medicine

## 2020-05-11 ENCOUNTER — Telehealth: Payer: Self-pay

## 2020-05-11 MED ORDER — ALBUTEROL SULFATE HFA 108 (90 BASE) MCG/ACT IN AERS: 1.0000 | INHALATION_SPRAY | RESPIRATORY_TRACT | 5 refills | Status: DC | PRN

## 2020-05-11 MED ORDER — BLOOD GLUCOSE MONITOR KIT: PACK | 0 refills | Status: DC

## 2020-05-11 NOTE — Telephone Encounter (Signed)
Refills sent, however they are printing and need to be faxed to [facility].

## 2020-05-11 NOTE — Telephone Encounter (Signed)
..   LAST APPOINTMENT DATE: 05/04/2020   NEXT APPOINTMENT DATE:@10 /27/2021  MEDICATION: albuterol (VENTOLIN HFA) 108 (90 Base) MCG/ACT inhaler  [employer] Trumetrix blood glucose meter  Trumetrix test strips  truplus 33 gauge lancets  trumetrix level 1 control solution    PHARMACY: [facility]     OTHER COMMENTS:    Okay for refill?  Please advise

## 2020-05-12 ENCOUNTER — Telehealth: Payer: Self-pay

## 2020-05-12 NOTE — Telephone Encounter (Signed)
Medication has been sent to the patient's pharmacy.  

## 2020-05-12 NOTE — Telephone Encounter (Signed)
[place], Would you mind helping fax the refills to [facility]? I am not 100% sure how to do that. Thanks

## 2020-05-12 NOTE — Progress Notes (Signed)
Chronic Care Management Pharmacy Assistant   Name: Michelle [name]  MRN: [id number] DOB: [birth year]  Reason for Encounter: Initial Visit  Patient Questions:  1.  Have you seen any other providers since your last visit?   Patient stated she did not see another provider since last visit.   2.  Any changes in your medicines or health?   Patient stated she did not have any changes to medications or health.    Michelle [name],  86 y.o. , female presents for their Initial CCM visit with the clinical pharmacist In office.  PCP : Michelle Olp, MD  Allergies:   Allergies  Allergen Reactions  . Codeine Sulfate Nausea Only  . Morphine Sulfate Nausea Only    Medications: Outpatient Encounter Medications as of 05/12/2020  Medication Sig Note  . ACCU-CHEK FASTCLIX LANCETS MISC    . ACCU-CHEK SMARTVIEW test strip TEST BLOOD SUGAR THREE TIMES DAILY   . acetaminophen (TYLENOL) 325 MG tablet Take 650 mg by mouth every 6 (six) hours as needed for mild pain. Reported on 10/23/2015   . albuterol (PROVENTIL) (2.5 MG/3ML) 0.083% nebulizer solution USE THREE MILLILITERS VIA NEBULIZATION BY MOUTH EVERY 6 HOURS AS NEEDED FOR WHEEZING OR SHORTNESS OF BREATH   . albuterol (VENTOLIN HFA) 108 (90 Base) MCG/ACT inhaler Inhale 1 puff into the lungs every 4 (four) hours as needed for wheezing or shortness of breath (RESCUE inhaler if advair not working).   . Alcohol Swabs (B-D SINGLE USE SWABS REGULAR) PADS USE  FOR  TESTING THREE TIMES DAILY   . amLODipine (NORVASC) 10 MG tablet TAKE 1 TABLET EVERY DAY   . aspirin 81 MG tablet Take 81 mg by mouth daily.  12/14/2014: .  Marland Kitchen Blood Glucose Calibration (ACCU-CHEK SMARTVIEW CONTROL) LIQD    . blood glucose meter kit and supplies KIT Dispense based on patient and insurance preference. Use up to four times daily as directed. (FOR ICD-9 250.00, 250.01).   Marland Kitchen clopidogrel (PLAVIX) 75 MG tablet TAKE ONE TABLET BY MOUTH DAILY **PLEASE KEEP UPCOMING APPOINTMENT IN  NOVEMBER WITH DOCTOR Michelle [name] FOR FUTURE REFILLS   . enalapril (VASOTEC) 20 MG tablet TAKE 1 TABLET TWICE DAILY   . fenofibrate micronized (LOFIBRA) 134 MG capsule TAKE ONE CAPSULE BY MOUTH DAILY   . ferrous sulfate 325 (65 FE) MG tablet Twice a week   . fish oil-omega-3 fatty acids 1000 MG capsule Take 2 g by mouth daily.     . Fluticasone-Salmeterol (ADVAIR) 250-50 MCG/DOSE AEPB INHALE ONE PUFF BY MOUTH DAILY AS NEEDED   . folic acid (FOLVITE) 101 MCG tablet Take 400 mcg by mouth daily.     . furosemide (LASIX) 40 MG tablet Take 1 tablet (40 mg total) by mouth daily.   . insulin glargine (LANTUS SOLOSTAR) 100 UNIT/ML Solostar Pen INJECT  17  TO  20 UNITS SUBCUTANEOUSLY EVERY MORNING   . 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 KLOR-CON M20 20 MEQ tablet TAKE ONE TABLET BY MOUTH DAILY   . Melatonin 5 MG CAPS Take 1 capsule by mouth at bedtime.    . metFORMIN (GLUCOPHAGE) 1000 MG tablet TAKE 1 TABLET TWICE DAILY   . metoprolol tartrate (LOPRESSOR) 25 MG tablet TAKE 1 TABLET TWICE DAILY   . nitroGLYCERIN (NITROSTAT) 0.4 MG SL tablet PLACE 1 TABLET (0.4 MG TOTAL) UNDER THE TONGUE EVERY 5 (FIVE) MINUTESAS NEEDED FOR CHEST  PAIN (CHEST PAIN OR SHORTNESS OF BREATH).   Marland Kitchen pantoprazole (PROTONIX) 40 MG tablet Take 1 tablet (40 mg total) by mouth daily.   . rosuvastatin (CRESTOR) 40 MG tablet TAKE 1 TABLET EVERY DAY   . sertraline (ZOLOFT) 25 MG tablet TAKE 1 TABLET EVERY DAY   . sertraline (ZOLOFT) 50 MG tablet Take 1 tablet (50 mg total) by mouth at bedtime.   . sodium chloride (OCEAN) 0.65 % SOLN nasal spray Place 1 spray into both nostrils as needed for congestion.   Marland Kitchen SPIRIVA HANDIHALER 18 MCG inhalation capsule PLACE 1 CAPSULE INTO HANDIHALER AND INHALE CONTENTS OF 1 CAPSULE EVERY DAY AS DIRECTED AT 2 PM   . traMADol (ULTRAM) 50 MG tablet TAKE ONE TABLET BY MOUTH THREE TIMES A DAY AS NEEDED   . triamcinolone cream (KENALOG)  0.1 % Apply 1 application topically 2 (two) times daily.   . vitamin E 400 UNIT capsule Take 400 Units by mouth daily.      No facility-administered encounter medications on file as of 05/12/2020.    Current Diagnosis: Patient Active Problem List   Diagnosis Date Noted  . Actinic keratosis 11/17/2017  . Anemia, iron deficiency 10/23/2015  . Chest pain 12/14/2014  . CKD (chronic kidney disease), stage II 07/25/2014  . Osteoarthritis, knee 05/13/2014  . Anxiety state 04/21/2014  . Chronic diastolic CHF (congestive heart failure) ([place]) 03/13/2014  . SOB (shortness of breath) 03/05/2014  . CAD- RCA PCI '90s, STEMI-RCA DES 02/18/14 03/05/2014  . Back pain, lumbosacral 10/10/2012  . IDDM type 2 03/26/2007  . Hyperlipidemia associated with type 2 diabetes mellitus ([place]) 03/26/2007  . Hypertension associated with diabetes ([place]) 04/21/2006  . COPD (chronic obstructive pulmonary disease) ([place]) 04/21/2006   Have you seen any other providers since your last visit?  Patient stated she has not seen any other provider since last visit.  Any changes in your medications or health?  Patient stated no changes in medications at this time.  Any side effects from any medications? Patient stated no side effects from medications at this time.  Do you have an symptoms or problems not managed by your medications?      Patient states she has no problems not managed by medications at this time.   Any concerns about your health right now? Patient stated that she is taking Tramadol for arthritis of the knee and Tramadol is not effective.Patient stated that she would like a stronger medication for pain.   Has your provider asked that you check blood pressure, blood sugar, or follow special diet at home? Patient stated she does check blood pressure everyday. Patient stated she is not on a special diet and does not check blood sugar at home.   Do you get any type of exercise on a regular basis?  Patient  stated she does not exercise due to the pain in her knee from arthritis. Patient stated she stays in the bed most of the time due to knee pain.  Can you think of a goal you would like to reach for your health?  Patient stated she does not have any goals for health at this time.  Do you have any problems getting your medications? Patient stated she does not have any problems getting medications at this time.  Is there anything that you would like to discuss during the appointment?  Patient stated she does not have anything to discuss at this time.  Please bring medications and supplements to appointment   [facility]  Junie Spencer ,[place] Pharmacist Assistant [phone]  Follow-Up:  Pharmacist Review

## 2020-05-13 ENCOUNTER — Ambulatory Visit: Payer: Medicare HMO

## 2020-05-13 ENCOUNTER — Other Ambulatory Visit: Payer: Self-pay

## 2020-05-13 DIAGNOSIS — E785 Hyperlipidemia, unspecified: Secondary | ICD-10-CM

## 2020-05-13 DIAGNOSIS — E1169 Type 2 diabetes mellitus with other specified complication: Secondary | ICD-10-CM

## 2020-05-13 DIAGNOSIS — J449 Chronic obstructive pulmonary disease, unspecified: Secondary | ICD-10-CM

## 2020-05-13 DIAGNOSIS — E1159 Type 2 diabetes mellitus with other circulatory complications: Secondary | ICD-10-CM

## 2020-05-13 DIAGNOSIS — E119 Type 2 diabetes mellitus without complications: Secondary | ICD-10-CM

## 2020-05-13 DIAGNOSIS — Z794 Long term (current) use of insulin: Secondary | ICD-10-CM

## 2020-05-13 NOTE — Progress Notes (Signed)
Chronic Care Management Pharmacy  Name: Michelle [name]  MRN: [id number]  DOB: 03/02/1933  Chief Complaint/ HPI  Michelle [name], 86 y.o., female, presents for their initial CCM visit with the clinical pharmacist in office. Accompanied by son, Michelle [name].   PCP : [name], MD   Encounter Diagnoses  Name Primary?  . Hypertension associated with diabetes ([place]) Yes  . Chronic obstructive pulmonary disease, unspecified COPD type ([place])   . Hyperlipidemia associated with type 2 diabetes mellitus ([place])   . IDDM type 2    Patient Active Problem List   Diagnosis Date Noted  . Actinic keratosis 11/17/2017  . Anemia, iron deficiency 10/23/2015  . Chest pain 12/14/2014  . CKD (chronic kidney disease), stage II 07/25/2014  . Osteoarthritis, knee 05/13/2014  . Anxiety state 04/21/2014  . Chronic diastolic CHF (congestive heart failure) ([place]) 03/13/2014  . SOB (shortness of breath) 03/05/2014  . CAD- RCA PCI '90s, STEMI-RCA DES 02/18/14 03/05/2014  . Back pain, lumbosacral 10/10/2012  . IDDM type 2 03/26/2007  . Hyperlipidemia associated with type 2 diabetes mellitus ([place]) 03/26/2007  . Hypertension associated with diabetes ([place]) 04/21/2006  . COPD (chronic obstructive pulmonary disease) ([place]) 04/21/2006   Past Surgical History:  Procedure Laterality Date  . CATARACT EXTRACTION  2020   right and left eye   . CHOLECYSTECTOMY    . CORONARY ANGIOPLASTY WITH STENT PLACEMENT  02/18/14   Promus DES to RCA  . LEFT HEART CATHETERIZATION WITH CORONARY ANGIOGRAM N/A 02/18/2014   Procedure: LEFT HEART CATHETERIZATION WITH CORONARY ANGIOGRAM;  Surgeon: Jettie Booze, MD;  Location: [facility] CATH LAB;  Service: Cardiovascular;  Laterality: N/A;  . PTCA  1990   PTCA of RCA  . TRANSTHORACIC ECHOCARDIOGRAM  02/02/2012   mild LVH, EF 55-60%, Normal WM, Gr 1 DD; Mild MR   Family History  Problem Relation Age of Onset  . Heart attack Mother 12  . Cancer Father 77  . Cancer Maternal Grandfather   .  Cancer Paternal Grandmother   . Cancer Paternal Grandfather   . Diabetes Son   . Liver cancer Brother   . Bone cancer Brother   . Heart attack Son   . Hypertension Son   . Diabetes Son   . Hyperlipidemia Son    Allergies  Allergen Reactions  . Codeine Sulfate Nausea Only  . Morphine Sulfate Nausea Only   Outpatient Encounter Medications as of 05/13/2020  Medication Sig Note  . ACCU-CHEK FASTCLIX LANCETS MISC    . ACCU-CHEK SMARTVIEW test strip TEST BLOOD SUGAR THREE TIMES DAILY   . acetaminophen (TYLENOL) 325 MG tablet Take 650 mg by mouth every 6 (six) hours as needed for mild pain. Reported on 10/23/2015   . albuterol (PROVENTIL) (2.5 MG/3ML) 0.083% nebulizer solution USE THREE MILLILITERS VIA NEBULIZATION BY MOUTH EVERY 6 HOURS AS NEEDED FOR WHEEZING OR SHORTNESS OF BREATH   . albuterol (VENTOLIN HFA) 108 (90 Base) MCG/ACT inhaler Inhale 1 puff into the lungs every 4 (four) hours as needed for wheezing or shortness of breath (RESCUE inhaler if advair not working).   . Alcohol Swabs (B-D SINGLE USE SWABS REGULAR) PADS USE  FOR  TESTING THREE TIMES DAILY   . amLODipine (NORVASC) 10 MG tablet TAKE 1 TABLET EVERY DAY   . aspirin 81 MG tablet Take 81 mg by mouth daily.  12/14/2014: .  Marland Kitchen Blood Glucose Calibration (ACCU-CHEK SMARTVIEW CONTROL) LIQD    . blood glucose meter kit and supplies KIT Dispense based on  patient and insurance preference. Use up to four times daily as directed. (FOR ICD-9 250.00, 250.01).   Marland Kitchen clopidogrel (PLAVIX) 75 MG tablet TAKE ONE TABLET BY MOUTH DAILY **PLEASE KEEP UPCOMING APPOINTMENT IN NOVEMBER WITH DOCTOR [facility] FOR FUTURE REFILLS   . enalapril (VASOTEC) 20 MG tablet TAKE 1 TABLET TWICE DAILY   . fenofibrate micronized (LOFIBRA) 134 MG capsule TAKE ONE CAPSULE BY MOUTH DAILY   . ferrous sulfate 325 (65 FE) MG tablet Twice a week   . fish oil-omega-3 fatty acids 1000 MG capsule Take 2 g by mouth daily.     . Fluticasone-Salmeterol (ADVAIR) 250-50 MCG/DOSE AEPB  INHALE ONE PUFF BY MOUTH DAILY AS NEEDED (Patient taking differently: INHALE ONE PUFF BY MOUTH TWICE DAILY)   . folic acid (FOLVITE) 465 MCG tablet Take 400 mcg by mouth daily.     . furosemide (LASIX) 40 MG tablet Take 1 tablet (40 mg total) by mouth daily.   . insulin glargine (LANTUS SOLOSTAR) 100 UNIT/ML Solostar Pen INJECT  17  TO  20 UNITS SUBCUTANEOUSLY EVERY MORNING   . 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 KLOR-CON M20 20 MEQ tablet TAKE ONE TABLET BY MOUTH DAILY   . Melatonin 5 MG CAPS Take 1 capsule by mouth at bedtime.    . metFORMIN (GLUCOPHAGE) 1000 MG tablet TAKE 1 TABLET TWICE DAILY   . metoprolol tartrate (LOPRESSOR) 25 MG tablet TAKE 1 TABLET TWICE DAILY   . nitroGLYCERIN (NITROSTAT) 0.4 MG SL tablet PLACE 1 TABLET (0.4 MG TOTAL) UNDER THE TONGUE EVERY 5 (FIVE) MINUTESAS NEEDED FOR CHEST PAIN (CHEST PAIN OR SHORTNESS OF BREATH).   Marland Kitchen pantoprazole (PROTONIX) 40 MG tablet Take 1 tablet (40 mg total) by mouth daily.   . rosuvastatin (CRESTOR) 40 MG tablet TAKE 1 TABLET EVERY DAY   . sertraline (ZOLOFT) 25 MG tablet TAKE 1 TABLET EVERY DAY   . sertraline (ZOLOFT) 50 MG tablet Take 1 tablet (50 mg total) by mouth at bedtime.   . sodium chloride (OCEAN) 0.65 % SOLN nasal spray Place 1 spray into both nostrils as needed for congestion.   Marland Kitchen SPIRIVA HANDIHALER 18 MCG inhalation capsule PLACE 1 CAPSULE INTO HANDIHALER AND INHALE CONTENTS OF 1 CAPSULE EVERY DAY AS DIRECTED AT 2 PM   . traMADol (ULTRAM) 50 MG tablet TAKE ONE TABLET BY MOUTH THREE TIMES A DAY AS NEEDED   . triamcinolone cream (KENALOG) 0.1 % Apply 1 application topically 2 (two) times daily.   . vitamin E 400 UNIT capsule Take 400 Units by mouth daily.      No facility-administered encounter medications on file as of 05/13/2020.   Patient Care Team    Relationship Specialty Notifications Start End  [name], MD PCP - General Family  Medicine  04/03/14    Comment: Awilda Metro, Annita Brod, MD PCP - Cardiology Cardiology Admissions 03/23/18   Karin Golden, MD Consulting Physician Dermatology  05/20/19   Bernarda Caffey, MD Consulting Physician Ophthalmology  05/20/19   Madelin Rear, [facility] Pharmacist Pharmacist  03/18/20    Comment: [phone]   Current Diagnosis/Assessment: Goals Addressed            This Visit's Progress   . PharmD Care Plan       CARE PLAN ENTRY (see longitudinal plan of care for additional care plan information)  Current Barriers:  . Chronic Disease Management support, education, and  care coordination needs related to Hypertension, Hyperlipidemia, Diabetes, and COPD   Hypertension BP Readings from Last 3 Encounters:  02/18/20 130/70  11/21/19 140/62  07/23/19 130/78   . Pharmacist Clinical Goal(s): o Over the next 180 days, patient will work with PharmD and providers to maintain BP goal <130/80 . Current regimen:  . Metoprolol tartrate 25 mg twice daily (acts on heart rate and lowers blood pressure) . Lasix 40 mg daily (helps primarily with fluid retention - known as "water pill") . Enalapril 20 mg once daily (lowers blood pressure and also can offer kidney protection in patients with diabetes) . Interventions: o Diet / exercise recommendations  . Patient self care activities - Over the next 180 days, patient will: o Check BP once every 1-2 weeks, document, and provide at future appointments o Bring BP cuff to upcoming December physical to compare to office readings for accuracy o Ensure daily salt intake < 2000 mg/day o Focus on intake of white meats like skinless chicken, fish, ground [place] over red meats  Hyperlipidemia Lipid Panel     Component Value Date/Time   CHOL 128 02/18/2020 1529   TRIG 355 (H) 02/18/2020 1529   TRIG 1693 (HH) 07/28/2006 1202   HDL 49 (L) 02/18/2020 1529   CHOLHDL 2.6 02/18/2020 1529   VLDL UNABLE TO CALCULATE IF TRIGLYCERIDE OVER 400 mg/dL  02/19/2014 0228   LDLCALC 42 02/18/2020 1529   LDLDIRECT 40.0 07/23/2019 1445   . Pharmacist Clinical Goal(s): o Over the next 180 days, patient will work with PharmD and providers to maintain LDL goal < 70 . Current regimen:  o Fenofibrate 134 mg once daily (lowers triglycerides - noted as "TRIG" above) o Simvastatin 40 mg once daily (effectively lowers LDL/"lousy" cholesterol, increases HDL/"Happy" cholesterol, also lowers triglycerides) (can prevent heart attacks and stroke) Interventions: o Diet / exercise recommendations - see blood pressure section . Patient self care activities - Over the next 180 days, patient will: o Diet / exercise recommendations - see blood pressure section  Diabetes Lab Results  Component Value Date/Time   HGBA1C 6.6 (H) 02/18/2020 03:29 PM   HGBA1C 6.6 (H) 11/21/2019 03:42 PM   . Pharmacist Clinical Goal(s): o Over the next 180 days, patient will work with PharmD and providers to maintain A1c goal <7% . Current regimen:   Lantus 17 to 20 units every morning  Metformin 1000 mg twice daily . Interventions: o Reviewed low blood sugar and correction methods - Rule of 15 - Recommend purchasing blood glucose tablets to have on hand - can purchase at most pharmacies . Patient self care activities - Over the next 180 days, patient will: o Check blood sugar once daily, document, and provide at future appointments o Contact provider with any episodes of hypoglycemia (blood glucose <70)  COPD . Pharmacist Clinical Goal(s) o Over the next 180 days, patient will work with PharmD and providers to minimize symptoms of COPD and ensure medication safety . Current regimen:   Spiriva Handihaler - inhale contents of capsule once daily (Maintenance - used daily to prevent symptoms of COPD)  Advair 250-50 mcg/dose - one puff twice daily (Maintenance - used daily to prevent symptoms of COPD)  Ventolin HFA - inhale 1 puff into the lungs every 4 hours as needed for  wheezing or shortness of breath (Rescue - typically used to relieve active symptoms of COPD, if using routinely or multiple times per week, may indicate COPD is not well controlled) . Interventions: o Reviewed inhaler technique,  counseled on each o Reviewed instructions for twice daily advair o Requested new advair rx . Patient self care activities - Over the next 180 days, patient will: o Ensure consistent use of maintenance inhalers - Spriva once daily, Advair twice daily o Report any changes in breathing or frequent use of rescue inhaler - albuterol (Ventolin)  Medication management . Pharmacist Clinical Goal(s): o Over the next 180 days, patient will work with PharmD and providers to maintain optimal medication adherence . Current pharmacy: [facility] . Interventions o Comprehensive medication review performed. o Requested for all Rxs to be filled through [facility] moving forward . Patient self care activities - Over the next 180 days, patient will: o Take medications as prescribed o Report any questions or concerns to PharmD and/or provider(s) Initial goal documentation.      Hypertension   BP goal <130/80  BP Readings from Last 3 Encounters:  02/18/20 130/70  11/21/19 140/62  07/23/19 130/78   Pulse Readings from Last 3 Encounters:  02/18/20 68  11/21/19 76  07/23/19 69   BMP Latest Ref Rng & Units 02/18/2020 11/21/2019 07/23/2019  Glucose 65 - 99 mg/dL 110(H) 163(H) 100(H)  BUN 7 - 25 mg/dL 21 31(H) 16  Creatinine 0.60 - 0.88 mg/dL 0.77 0.83 0.74  BUN/Creat Ratio 6 - 22 (calc) NOT APPLICABLE - -  Sodium 665 - 146 mmol/L 139 138 138  Potassium 3.5 - 5.3 mmol/L 3.8 4.0 4.3  Chloride 98 - 110 mmol/L 104 104 100  CO2 20 - 32 mmol/L '28 27 28  ' Calcium 8.6 - 10.4 mg/dL 9.5 10.3 10.1   Previous medications: carvedilol (non-selective BB in COPD). Patient checks BP at home daily. Recent home readings: unable to recall, recently obtained new meter.  Reports consistent  use of potassium rx. Denies dizziness. Exercise is limited due to instability/SOB while on feet.   Patient is currently near goal on the following medications:  . Amlodipine 10 mg once daily  . Metoprolol tartrate 25 mg twice daily . Lasix 40 mg daily  . Enalapril 20 mg once daily   We discussed: diet and exercise extensively, home BP monitoring.   Plan  Continue current medications.  Diabetes   A1c goal < 7%  Lab Results  Component Value Date/Time   HGBA1C 6.6 (H) 02/18/2020 03:29 PM   HGBA1C 6.6 (H) 11/21/2019 03:42 PM   MICROALBUR 0.7 01/07/2014 09:43 AM   MICROALBUR 0.2 07/08/2013 03:12 PM    Checking BG: Daily. Recent FBG readings: 120s-150 most days. Denies any symptoms of low blood sugar, not sure how to correct low BG if it was to happen.  Reports to be tolerating medications well. Patient is currently at goal on the following medications:   Lantus 17 to 20 units every morning  Metformin 1000 mg twice daily  We discussed: diet and exercise extensively.  Reviewed rule of 15 for blood sugar correction Provided handout for DM diet.   Plan  Pt to purchase glucose tablets to have on hand  Continue current medications.  Hyperlipidemia   LDL goal < 70  Lipid Panel     Component Value Date/Time   CHOL 128 02/18/2020 1529   TRIG 355 (H) 02/18/2020 1529   TRIG 1693 (HH) 07/28/2006 1202   HDL 49 (L) 02/18/2020 1529   LDLCALC 42 02/18/2020 1529   LDLDIRECT 40.0 07/23/2019 1445    Hepatic Function Latest Ref Rng & Units 02/18/2020 11/21/2019 02/22/2019  Total Protein 6.1 - 8.1 g/dL 6.9 6.9  7.1  Albumin 3.5 - 5.2 g/dL - 4.2 -  AST 10 - 35 U/L '14 14 14  ' ALT 6 - 29 U/L '10 12 11  ' Alk Phosphatase 39 - 117 U/L - 50 -  Total Bilirubin 0.2 - 1.2 mg/dL 0.3 0.3 0.3  Bilirubin, Direct 0.0 - 0.3 mg/dL - - -    The ASCVD Risk score Mikey Bussing DC Jr., et al., 2013) failed to calculate for the following reasons:   The 2013 ASCVD risk score is only valid for ages 66 to 69   Patient  has failed these meds in past:  Patient is currently at goal the following medications:  . Fenofibrate 134 mg once daily . Rosuvastatin 40 mg once daily  Secondary prevention. Denies any abnormal bruising, bleeding from nose or gums or blood in urine or stool.  Patient is currently controlled on the following medications:  . Plavix 75 mg once daily  . Aspirin 81 mg once daily   We discussed diet and exercise extensively.  Reviewed signs/symptoms of bleed.  Plan  Continue current medications.  COPD   Last spirometry: 2013.Gold Grade: Gold 1 (FEV1>80%). Lab Results  Component Value Date/Time   EOSPCT 1.6 02/18/2020 03:29 PM   Lab Results  Component Value Date/Time   EOSABS 139 02/18/2020 03:29 PM   CAT ASSESSMENT  Rank each of the following items on a scale of 0 to 5 (with 5 being most severe) Write a # 0-5 in each box  I never cough (0) > I cough all the time (5) 5  I have no phlegm (mucus) in my chest (0) > My chest is completely full of phlegm (mucus) (5) 5  My chest does not feel tight at all (0) > My chest feels very tight (5) 0  When I walk up a hill or one flight of stairs I am not breathless (0) > When I walk up a hill or one flight of stairs I am very breathless (5) n/a  I am not limited doing any activities at home (0) > I am very limited doing activities at home (5) 5 (breathless while cooking)  I am confident leaving my home despite my lung function (0) > I am not at all confident leaving my home because of my lung condition (5)  n/a  I sleep soundly (0) > I don't sleep soundly because of my lung condition (5) 0  I have lots of energy (0) > I have no energy at all (5) 3  Total CAT Score: 18 (unable to answer 2 questions due to her restricted mobility - using wheelchair or walker)  Using maintenance inhaler regularly? Yes. Frequency of rescue inhaler use: 1-3x daily. Limited with daily activities due to instability on feet. Does report breathlessness when trying to  cook.  Had been using advair once daily since last OV. Reports good compliance from both pt and son, reviewed instructions for twice daily advair. Patients current regimen  Spiriva Handihaler - inhale contents of capsule once daily (Maintenance)  Advair 250-50 mcg/dose - one puff twice daily (Maintenance)  Ventolin HFA - inhale 1 puff every 4 hours as needed for wheezing or shortness of breath (Rescue - Ideally would not need to use this routinely)  Counseled on inhaler use and discussed differences between maintenance vs rescue inhalers in detail.  Plan  Recommend referral for PFTs.  GERD   Patient denies recent acid reflux.  Currently controlled on: . Pantoprazole 40 mg once daily   We discussed:  Avoidance of potential triggers such as alcohol, fatty foods, lying down after eating, and tomato sauce.  Plan   Continue current medications.  Vaccines   Immunization History  Administered Date(s) Administered  . Influenza Split 04/21/2011, 05/16/2012  . Influenza Whole 05/24/2007, 05/01/2009, 04/17/2010  . Influenza, High Dose Seasonal PF 03/23/2018, 05/25/2019  . Influenza,inj,Quad PF,6+ Mos 04/14/2013, 04/10/2014, 04/03/2015  . Influenza-Unspecified 06/01/2016, 06/15/2017  . Moderna SARS-COVID-2 Vaccination 10/25/2019, 11/22/2019  . Pneumococcal Conjugate-13 09/22/2014, 05/25/2019  . Pneumococcal Polysaccharide-23 04/18/2003  . Tdap 07/29/2011  . Zoster 04/18/2011   Reviewed and discussed patient's vaccination history. Son will be taking her to get flu shot this week. Plans to get covid vaccine booster early november Counseled on shingrix.   Plan  Recommended patient receive covid booster, annual flu shot, shingrix.  Medication Management / Care Coordination   Receives prescription medications from:  [facility], [place] [address] Phone: [phone] Fax: [phone]   Denies cost  concerns with medications. Patient is wanting to have all medications sent to [facility].  Pt requesting below meds be sent to [facility] (rather than [facility]):   Albuterol sulfate  Fenofibrate  Pantoprazole  klor-con  Tramadol  Denies any issues with current costs of medications.   Plan  RPH to request refills to be sent to [facility]  ___________________________ SDOH (Social Determinants of Health) assessments performed: Yes.  Future Appointments  Date Time Provider [place]  06/25/2020  1:20 PM [name], MD [facility]   Visit follow-up:  . CPA follow-up: November DM call, ask how breathing is now that she is taking advair twice daily. ask how often she is using albuterol rescue inhaler. confirm patient is taking total of 1200 mg calcium/day and 1000 units of vitamin d for bone health in postmenopausal women. schedule january telephone visit with [facility] . RPH follow-up: 2 month telephone visit.  Madelin Rear, Pharm.D., BCGP Clinical Pharmacist [facility] [phone]

## 2020-05-15 NOTE — Patient Instructions (Addendum)
Ms. Henne,  It was great to meet both you and your son. I appreciate you taking the time to come in and review your medications with me.  Please review notes from today's visit and call me at [phone] with any questions!  We will follow-up you guys if there are any anticipated changes.   Thank you, Edyth Gunnels., Clinical Pharmacist  Goals Addressed            This Visit's Progress   . PharmD Care Plan       CARE PLAN ENTRY (see longitudinal plan of care for additional care plan information)  Current Barriers:  . Chronic Disease Management support, education, and care coordination needs related to Hypertension, Hyperlipidemia, Diabetes, and COPD   Hypertension BP Readings from Last 3 Encounters:  02/18/20 130/70  11/21/19 140/62  07/23/19 130/78   . Pharmacist Clinical Goal(s): o Over the next 180 days, patient will work with PharmD and providers to maintain BP goal <130/80 . Current regimen:  . Metoprolol tartrate 25 mg twice daily (acts on heart rate and lowers blood pressure) . Lasix 40 mg daily (helps primarily with fluid retention - known as "water pill") . Enalapril 20 mg once daily (lowers blood pressure and also can offer kidney protection in patients with diabetes) . Interventions: o Diet / exercise recommendations  . Patient self care activities - Over the next 180 days, patient will: o Check BP once every 1-2 weeks, document, and provide at future appointments o Bring BP cuff to upcoming December physical to compare to office readings for accuracy o Ensure daily salt intake < 2000 mg/day o Focus on intake of white meats like skinless chicken, fish, ground [place] over red meats  Hyperlipidemia Lipid Panel     Component Value Date/Time   CHOL 128 02/18/2020 1529   TRIG 355 (H) 02/18/2020 1529   TRIG 1693 (HH) 07/28/2006 1202   HDL 49 (L) 02/18/2020 1529   CHOLHDL 2.6 02/18/2020 1529   VLDL UNABLE TO CALCULATE IF TRIGLYCERIDE OVER 400 mg/dL 02/19/2014 0228    LDLCALC 42 02/18/2020 1529   LDLDIRECT 40.0 07/23/2019 1445   . Pharmacist Clinical Goal(s): o Over the next 180 days, patient will work with PharmD and providers to maintain LDL goal < 70 . Current regimen:  o Fenofibrate 134 mg once daily (lowers triglycerides - noted as "TRIG" above) o Simvastatin 40 mg once daily (effectively lowers LDL/"lousy" cholesterol, increases HDL/"Happy" cholesterol, also lowers triglycerides) (can prevent heart attacks and stroke) Interventions: o Diet / exercise recommendations - see blood pressure section . Patient self care activities - Over the next 180 days, patient will: o Diet / exercise recommendations - see blood pressure section  Diabetes Lab Results  Component Value Date/Time   HGBA1C 6.6 (H) 02/18/2020 03:29 PM   HGBA1C 6.6 (H) 11/21/2019 03:42 PM   . Pharmacist Clinical Goal(s): o Over the next 180 days, patient will work with PharmD and providers to maintain A1c goal <7% . Current regimen:   Lantus 17 to 20 units every morning  Metformin 1000 mg twice daily . Interventions: o Reviewed low blood sugar and correction methods - Rule of 15 - Recommend purchasing blood glucose tablets to have on hand - can purchase at most pharmacies . Patient self care activities - Over the next 180 days, patient will: o Check blood sugar once daily, document, and provide at future appointments o Contact provider with any episodes of hypoglycemia (blood glucose <70)  COPD . Pharmacist Clinical Goal(s)  o Over the next 180 days, patient will work with PharmD and providers to minimize symptoms of COPD and ensure medication safety . Current regimen:   Spiriva Handihaler - inhale contents of capsule once daily (Maintenance - used daily to prevent symptoms of COPD)  Advair 250-50 mcg/dose - one puff twice daily (Maintenance - used daily to prevent symptoms of COPD)  Ventolin HFA - inhale 1 puff into the lungs every 4 hours as needed for wheezing or shortness  of breath (Rescue - typically used to relieve active symptoms of COPD, if using routinely or multiple times per week, may indicate COPD is not well controlled) . Interventions: o Reviewed inhaler technique, counseled on each o Reviewed instructions for twice daily advair o Requested new advair rx . Patient self care activities - Over the next 180 days, patient will: o Ensure consistent use of maintenance inhalers - Spriva once daily, Advair twice daily o Report any changes in breathing or frequent use of rescue inhaler - albuterol (Ventolin)  Medication management . Pharmacist Clinical Goal(s): o Over the next 180 days, patient will work with PharmD and providers to maintain optimal medication adherence . Current pharmacy: [facility] . Interventions o Comprehensive medication review performed. o Requested for all Rxs to be filled through [facility] moving forward . Patient self care activities - Over the next 180 days, patient will: o Take medications as prescribed o Report any questions or concerns to PharmD and/or provider(s) Initial goal documentation.      The patient verbalized understanding of instructions provided today and agreed to receive a mailed copy of patient instruction and/or educational materials. Telephone follow up appointment with pharmacy team member scheduled for: See next appointment with "Care Management Staff" under "What's Next" below.   Madelin Rear, Pharm.D., BCGP Clinical Pharmacist [facility] [phone]  High Cholesterol  High cholesterol is a condition in which the blood has high levels of a white, waxy, fat-like substance (cholesterol). The human body needs small amounts of cholesterol. The liver makes all the cholesterol that the body needs. Extra (excess) cholesterol comes from the food that we eat. Cholesterol is carried from the liver by the blood through the blood vessels. If you have high cholesterol, deposits (plaques) may  build up on the walls of your blood vessels (arteries). Plaques make the arteries narrower and stiffer. Cholesterol plaques increase your risk for heart attack and stroke. Work with your health care provider to keep your cholesterol levels in a healthy range. What increases the risk? This condition is more likely to develop in people who:  Eat foods that are high in animal fat (saturated fat) or cholesterol.  Are overweight.  Are not getting enough exercise.  Have a family history of high cholesterol. What are the signs or symptoms? There are no symptoms of this condition. How is this diagnosed? This condition may be diagnosed from the results of a blood test.  If you are older than age 64, your health care provider may check your cholesterol every 4-6 years.  You may be checked more often if you already have high cholesterol or other risk factors for heart disease. The blood test for cholesterol measures:  "Bad" cholesterol (LDL cholesterol). This is the main type of cholesterol that causes heart disease. The desired level for LDL is less than 100.  "Good" cholesterol (HDL cholesterol). This type helps to protect against heart disease by cleaning the arteries and carrying the LDL away. The desired level for HDL is  60 or higher.  Triglycerides. These are fats that the body can store or burn for energy. The desired number for triglycerides is lower than 150.  Total cholesterol. This is a measure of the total amount of cholesterol in your blood, including LDL cholesterol, HDL cholesterol, and triglycerides. A healthy number is less than 200. How is this treated? This condition is treated with diet changes, lifestyle changes, and medicines. Diet changes  This may include eating more whole grains, fruits, vegetables, nuts, and fish.  This may also include cutting back on red meat and foods that have a lot of added sugar. Lifestyle changes  Changes may include getting at least 40  minutes of aerobic exercise 3 times a week. Aerobic exercises include walking, biking, and swimming. Aerobic exercise along with a healthy diet can help you maintain a healthy weight.  Changes may also include quitting smoking. Medicines  Medicines are usually given if diet and lifestyle changes have failed to reduce your cholesterol to healthy levels.  Your health care provider may prescribe a statin medicine. Statin medicines have been shown to reduce cholesterol, which can reduce the risk of heart disease. Follow these instructions at home: Eating and drinking If told by your health care provider:  Eat chicken (without skin), fish, veal, shellfish, ground [place] breast, and round or loin cuts of red meat.  Do not eat fried foods or fatty meats, such as hot dogs and salami.  Eat plenty of fruits, such as apples.  Eat plenty of vegetables, such as broccoli, potatoes, and carrots.  Eat beans, peas, and lentils.  Eat grains such as barley, rice, couscous, and bulgur wheat.  Eat pasta without cream sauces.  Use skim or nonfat milk, and eat low-fat or nonfat yogurt and cheeses.  Do not eat or drink whole milk, cream, ice cream, egg yolks, or hard cheeses.  Do not eat stick margarine or tub margarines that contain trans fats (also called partially hydrogenated oils).  Do not eat saturated tropical oils, such as coconut oil and palm oil.  Do not eat cakes, cookies, crackers, or other baked goods that contain trans fats.  General instructions  Exercise as directed by your health care provider. Increase your activity level with activities such as gardening, walking, and taking the stairs.  Take over-the-counter and prescription medicines only as told by your health care provider.  Do not use any products that contain nicotine or tobacco, such as cigarettes and e-cigarettes. If you need help quitting, ask your health care provider.  Keep all follow-up visits as told by your health  care provider. This is important. Contact a health care provider if:  You are struggling to maintain a healthy diet or weight.  You need help to start on an exercise program.  You need help to stop smoking. Get help right away if:  You have chest pain.  You have trouble breathing. This information is not intended to replace advice given to you by your health care provider. Make sure you discuss any questions you have with your health care provider. Document Revised: 07/07/2017 Document Reviewed: 01/02/2016 Elsevier Patient Education  [place].   Hypoglycemia Hypoglycemia is when the sugar (glucose) level in your blood is too low. Signs of low blood sugar may include:  Feeling: ? Hungry. ? Worried or nervous (anxious). ? Sweaty and clammy. ? Confused. ? Dizzy. ? Sleepy. ? Sick to your stomach (nauseous).  Having: ? A fast heartbeat. ? A headache. ? A change in  your vision. ? Tingling or no feeling (numbness) around your mouth, lips, or tongue. ? Jerky movements that you cannot control (seizure).  Having trouble with: ? Moving (coordination). ? Sleeping. ? Passing out (fainting). ? Getting upset easily (irritability). Low blood sugar can happen to people who have diabetes and people who do not have diabetes. Low blood sugar can happen quickly, and it can be an emergency. Treating low blood sugar Low blood sugar is often treated by eating or drinking something sugary right away, such as:  Fruit juice, 4-6 oz (120-150 mL).  Regular soda (not diet soda), 4-6 oz (120-150 mL).  Low-fat milk, 4 oz (120 mL).  Several pieces of hard candy.  Sugar or honey, 1 Tbsp (15 mL). Treating low blood sugar if you have diabetes If you can think clearly and swallow safely, follow the 15:15 rule:  Take 15 grams of a fast-acting carb (carbohydrate). Talk with your doctor about how much you should take.  Always keep a source of fast-acting carb with you, such as: ? Sugar  tablets (glucose pills). Take 3-4 pills. ? 6-8 pieces of hard candy. ? 4-6 oz (120-150 mL) of fruit juice. ? 4-6 oz (120-150 mL) of regular (not diet) soda. ? 1 Tbsp (15 mL) honey or sugar.  Check your blood sugar 15 minutes after you take the carb.  If your blood sugar is still at or below 70 mg/dL (3.9 mmol/L), take 15 grams of a carb again.  If your blood sugar does not go above 70 mg/dL (3.9 mmol/L) after 3 tries, get help right away.  After your blood sugar goes back to normal, eat a meal or a snack within 1 hour.  Treating very low blood sugar If your blood sugar is at or below 54 mg/dL (3 mmol/L), you have very low blood sugar (severe hypoglycemia). This may also cause:  Passing out.  Jerky movements you cannot control (seizure).  Losing consciousness (coma). This is an emergency. Do not wait to see if the symptoms will go away. Get medical help right away. Call your local emergency services (911 in the [place] Do not drive yourself to the hospital. If you have very low blood sugar and you cannot eat or drink, you may need a glucagon shot (injection). A family member or friend should learn how to check your blood sugar and how to give you a glucagon shot. Ask your doctor if you need to have a glucagon shot kit at home. Follow these instructions at home: General instructions  Take over-the-counter and prescription medicines only as told by your doctor.  Stay aware of your blood sugar as told by your doctor.  Limit alcohol intake to no more than 1 drink a day for nonpregnant women and 2 drinks a day for men. One drink equals 12 oz of beer (355 mL), 5 oz of wine (148 mL), or 1 oz of hard liquor (44 mL).  Keep all follow-up visits as told by your doctor. This is important. If you have diabetes:   Follow your diabetes care plan as told by your doctor. Make sure you: ? Know the signs of low blood sugar. ? Take your medicines as told. ? Follow your exercise and meal  plan. ? Eat on time. Do not skip meals. ? Check your blood sugar as often as told by your doctor. Always check it before and after exercise. ? Follow your sick day plan when you cannot eat or drink normally. Make this plan ahead of  time with your doctor.  Share your diabetes care plan with: ? Your work or school. ? People you live with.  Check your pee (urine) for ketones: ? When you are sick. ? As told by your doctor.  Carry a card or wear jewelry that says you have diabetes. Contact a doctor if:  You have trouble keeping your blood sugar in your target range.  You have low blood sugar often. Get help right away if:  You still have symptoms after you eat or drink something sugary.  Your blood sugar is at or below 54 mg/dL (3 mmol/L).  You have jerky movements that you cannot control.  You pass out. These symptoms may be an emergency. Do not wait to see if the symptoms will go away. Get medical help right away. Call your local emergency services (911 in the [place] Do not drive yourself to the hospital. Summary  Hypoglycemia happens when the level of sugar (glucose) in your blood is too low.  Low blood sugar can happen to people who have diabetes and people who do not have diabetes. Low blood sugar can happen quickly, and it can be an emergency.  Make sure you know the signs of low blood sugar and know how to treat it.  Always keep a source of sugar (fast-acting carb) with you to treat low blood sugar. This information is not intended to replace advice given to you by your health care provider. Make sure you discuss any questions you have with your health care provider. Document Revised: 10/25/2018 Document Reviewed: 08/07/2015 Elsevier Patient Education  2020 [employer].

## 2020-05-18 ENCOUNTER — Other Ambulatory Visit: Payer: Self-pay

## 2020-05-18 DIAGNOSIS — J449 Chronic obstructive pulmonary disease, unspecified: Secondary | ICD-10-CM

## 2020-05-18 MED ORDER — FLUTICASONE-SALMETEROL 250-50 MCG/DOSE IN AEPB: INHALATION_SPRAY | RESPIRATORY_TRACT | 5 refills | Status: DC

## 2020-05-18 NOTE — Progress Notes (Signed)
Referral plaed, Rx changed.

## 2020-05-25 ENCOUNTER — Ambulatory Visit (INDEPENDENT_AMBULATORY_CARE_PROVIDER_SITE_OTHER): Payer: Medicare HMO

## 2020-05-25 DIAGNOSIS — Z0000 Encounter for general adult medical examination without abnormal findings: Secondary | ICD-10-CM

## 2020-05-25 NOTE — Patient Instructions (Addendum)
Michelle [name] , Thank you for taking time to come for your Medicare Wellness Visit. I appreciate your ongoing commitment to your health goals. Please review the following plan we discussed and let me know if I can assist you in the future.   Screening recommendations/referrals: Colonoscopy: No longer required Mammogram: No longer required  Bone Density: No longer required  Recommended yearly ophthalmology/optometry visit for glaucoma screening and checkup Recommended yearly dental visit for hygiene and checkup  Vaccinations: Influenza vaccine: Up to date  Done 05/24/20 Pneumococcal vaccine: Up to date Tdap vaccine: Up to date Shingles vaccine: Shingrix discussed. Please contact your pharmacy for coverage information.    Covid-19:Completed 4/9, & 11/22/19  Advanced directives: Advance directive discussed with you today. I have provided a copy for you to complete at home and have notarized. Once this is complete please bring a copy in to our office so we can scan it into your chart.  Conditions/risks identified: exercise   Next appointment: Follow up in one year for your annual wellness visit    Preventive Care 65 Years and Older, Female Preventive care refers to lifestyle choices and visits with your health care provider that can promote health and wellness. What does preventive care include?  A yearly physical exam. This is also called an annual well check.  Dental exams once or twice a year.  Routine eye exams. Ask your health care provider how often you should have your eyes checked.  Personal lifestyle choices, including:  Daily care of your teeth and gums.  Regular physical activity.  Eating a healthy diet.  Avoiding tobacco and drug use.  Limiting alcohol use.  Practicing safe sex.  Taking low-dose aspirin every day.  Taking vitamin and mineral supplements as recommended by your health care provider. What happens during an annual well check? The services and  screenings done by your health care provider during your annual well check will depend on your age, overall health, lifestyle risk factors, and family history of disease. Counseling  Your health care provider may ask you questions about your:  Alcohol use.  Tobacco use.  Drug use.  Emotional well-being.  Home and relationship well-being.  Sexual activity.  Eating habits.  History of falls.  Memory and ability to understand (cognition).  Work and work Statistician.  Reproductive health. Screening  You may have the following tests or measurements:  Height, weight, and BMI.  Blood pressure.  Lipid and cholesterol levels. These may be checked every 5 years, or more frequently if you are over 60 years old.  Skin check.  Lung cancer screening. You may have this screening every year starting at age 33 if you have a 30-pack-year history of smoking and currently smoke or have quit within the past 15 years.  Fecal occult blood test (FOBT) of the stool. You may have this test every year starting at age 38.  Flexible sigmoidoscopy or colonoscopy. You may have a sigmoidoscopy every 5 years or a colonoscopy every 10 years starting at age 80.  Hepatitis C blood test.  Hepatitis B blood test.  Sexually transmitted disease (STD) testing.  Diabetes screening. This is done by checking your blood sugar (glucose) after you have not eaten for a while (fasting). You may have this done every 1-3 years.  Bone density scan. This is done to screen for osteoporosis. You may have this done starting at age 74.  Mammogram. This may be done every 1-2 years. Talk to your health care provider about how often  you should have regular mammograms. Talk with your health care provider about your test results, treatment options, and if necessary, the need for more tests. Vaccines  Your health care provider may recommend certain vaccines, such as:  Influenza vaccine. This is recommended every  year.  Tetanus, diphtheria, and acellular pertussis (Tdap, Td) vaccine. You may need a Td booster every 10 years.  Zoster vaccine. You may need this after age 84.  Pneumococcal 13-valent conjugate (PCV13) vaccine. One dose is recommended after age 87.  Pneumococcal polysaccharide (PPSV23) vaccine. One dose is recommended after age 56. Talk to your health care provider about which screenings and vaccines you need and how often you need them. This information is not intended to replace advice given to you by your health care provider. Make sure you discuss any questions you have with your health care provider. Document Released: 07/31/2015 Document Revised: 03/23/2016 Document Reviewed: 05/05/2015 Elsevier Interactive Patient Education  2017 [place] Prevention in the Home Falls can cause injuries. They can happen to people of all ages. There are many things you can do to make your home safe and to help prevent falls. What can I do on the outside of my home?  Regularly fix the edges of walkways and driveways and fix any cracks.  Remove anything that might make you trip as you walk through a door, such as a raised step or threshold.  Trim any bushes or trees on the path to your home.  Use bright outdoor lighting.  Clear any walking paths of anything that might make someone trip, such as rocks or tools.  Regularly check to see if handrails are loose or broken. Make sure that both sides of any steps have handrails.  Any raised decks and porches should have guardrails on the edges.  Have any leaves, snow, or ice cleared regularly.  Use sand or salt on walking paths during winter.  Clean up any spills in your garage right away. This includes oil or grease spills. What can I do in the bathroom?  Use night lights.  Install grab bars by the toilet and in the tub and shower. Do not use towel bars as grab bars.  Use non-skid mats or decals in the tub or shower.  If you  need to sit down in the shower, use a plastic, non-slip stool.  Keep the floor dry. Clean up any water that spills on the floor as soon as it happens.  Remove soap buildup in the tub or shower regularly.  Attach bath mats securely with double-sided non-slip rug tape.  Do not have throw rugs and other things on the floor that can make you trip. What can I do in the bedroom?  Use night lights.  Make sure that you have a light by your bed that is easy to reach.  Do not use any sheets or blankets that are too big for your bed. They should not hang down onto the floor.  Have a firm chair that has side arms. You can use this for support while you get dressed.  Do not have throw rugs and other things on the floor that can make you trip. What can I do in the kitchen?  Clean up any spills right away.  Avoid walking on wet floors.  Keep items that you use a lot in easy-to-reach places.  If you need to reach something above you, use a strong step stool that has a grab bar.  Keep electrical cords  out of the way.  Do not use floor polish or wax that makes floors slippery. If you must use wax, use non-skid floor wax.  Do not have throw rugs and other things on the floor that can make you trip. What can I do with my stairs?  Do not leave any items on the stairs.  Make sure that there are handrails on both sides of the stairs and use them. Fix handrails that are broken or loose. Make sure that handrails are as long as the stairways.  Check any carpeting to make sure that it is firmly attached to the stairs. Fix any carpet that is loose or worn.  Avoid having throw rugs at the top or bottom of the stairs. If you do have throw rugs, attach them to the floor with carpet tape.  Make sure that you have a light switch at the top of the stairs and the bottom of the stairs. If you do not have them, ask someone to add them for you. What else can I do to help prevent falls?  Wear shoes  that:  Do not have high heels.  Have rubber bottoms.  Are comfortable and fit you well.  Are closed at the toe. Do not wear sandals.  If you use a stepladder:  Make sure that it is fully opened. Do not climb a closed stepladder.  Make sure that both sides of the stepladder are locked into place.  Ask someone to hold it for you, if possible.  Clearly mark and make sure that you can see:  Any grab bars or handrails.  First and last steps.  Where the edge of each step is.  Use tools that help you move around (mobility aids) if they are needed. These include:  Canes.  Walkers.  Scooters.  Crutches.  Turn on the lights when you go into a dark area. Replace any light bulbs as soon as they burn out.  Set up your furniture so you have a clear path. Avoid moving your furniture around.  If any of your floors are uneven, fix them.  If there are any pets around you, be aware of where they are.  Review your medicines with your doctor. Some medicines can make you feel dizzy. This can increase your chance of falling. Ask your doctor what other things that you can do to help prevent falls. This information is not intended to replace advice given to you by your health care provider. Make sure you discuss any questions you have with your health care provider. Document Released: 04/30/2009 Document Revised: 12/10/2015 Document Reviewed: 08/08/2014 Elsevier Interactive Patient Education  2017 [employer].

## 2020-05-25 NOTE — Progress Notes (Signed)
Virtual Visit via Telephone Note  I connected with  Michelle [name] on 05/25/20 at  2:30 PM EST by telephone and verified that I am speaking with the correct person using two identifiers.  Medicare Annual Wellness visit completed telephonically due to Covid-19 pandemic.   Persons participating in this call: This Health Coach and this patient and randy her son  Location: Patient: Home Provider: Office   I discussed the limitations, risks, security and privacy concerns of performing an evaluation and management service by telephone and the availability of in person appointments. The patient expressed understanding and agreed to proceed.  Unable to perform video visit due to video visit attempted and failed and/or patient does not have video capability.   Some vital signs may be absent or patient reported.   Willette Brace, LPN    Subjective:   Michelle [name] is a 86 y.o. female who presents for Medicare Annual (Subsequent) preventive examination.  Review of Systems     Cardiac Risk Factors include: advanced age (>66mn, >>5women);dyslipidemia;diabetes mellitus;hypertension;obesity (BMI >30kg/m2)     Objective:    There were no vitals filed for this visit. There is no height or weight on file to calculate BMI.  Advanced Directives 05/25/2020 05/20/2019 05/16/2018 04/14/2017 12/14/2014 07/13/2014 03/14/2014  Does Patient Have a Medical Advance Directive? No Yes No No No No -  Type of Advance Directive - Living will;Healthcare Power of Attorney - - - - -  Does patient want to make changes to medical advance directive? - No - Patient declined - - - - -  [place]? - No - copy requested - - - - -  Would patient like information on creating a medical advance directive? Yes (MAU/Ambulatory/Procedural Areas - Information given) - No - Patient declined (No Data) No - patient declined information No - patient declined information No - patient declined  information  Pre-existing out of facility DNR order (yellow form or pink MOST form) - - - - - - -    Current Medications (verified) Outpatient Encounter Medications as of 05/25/2020  Medication Sig  . ACCU-CHEK FASTCLIX LANCETS MISC   . ACCU-CHEK SMARTVIEW test strip TEST BLOOD SUGAR THREE TIMES DAILY  . acetaminophen (TYLENOL) 325 MG tablet Take 650 mg by mouth every 6 (six) hours as needed for mild pain. Reported on 10/23/2015  . albuterol (PROVENTIL) (2.5 MG/3ML) 0.083% nebulizer solution USE THREE MILLILITERS VIA NEBULIZATION BY MOUTH EVERY 6 HOURS AS NEEDED FOR WHEEZING OR SHORTNESS OF BREATH  . albuterol (VENTOLIN HFA) 108 (90 Base) MCG/ACT inhaler Inhale 1 puff into the lungs every 4 (four) hours as needed for wheezing or shortness of breath (RESCUE inhaler if advair not working).  . Alcohol Swabs (B-D SINGLE USE SWABS REGULAR) PADS USE  FOR  TESTING THREE TIMES DAILY  . amLODipine (NORVASC) 10 MG tablet TAKE 1 TABLET EVERY DAY  . aspirin 81 MG tablet Take 81 mg by mouth daily.   . Blood Glucose Calibration (ACCU-CHEK SMARTVIEW CONTROL) LIQD   . blood glucose meter kit and supplies KIT Dispense based on patient and insurance preference. Use up to four times daily as directed. (FOR ICD-9 250.00, 250.01).  . cholecalciferol (VITAMIN D3) 25 MCG (1000 UNIT) tablet Take 1,000 Units by mouth daily.  . clopidogrel (PLAVIX) 75 MG tablet TAKE ONE TABLET BY MOUTH DAILY **PLEASE KEEP UPCOMING AP[facility]EM[facility]  . enalapril (VASOTEC) 20 MG tablet TAKE  1 TABLET TWICE DAILY  . fenofibrate micronized (LOFIBRA) 134 MG capsule TAKE ONE CAPSULE BY MOUTH DAILY  . ferrous sulfate 325 (65 FE) MG tablet Twice a week  . fish oil-omega-3 fatty acids 1000 MG capsule Take 2 g by mouth daily.    . Fluticasone-Salmeterol (ADVAIR) 250-50 MCG/DOSE AEPB INHALE ONE PUFF BY MOUTH TWICE DAILY  . folic acid (FOLVITE) 025 MCG tablet Take 400 mcg by mouth daily.    . furosemide  (LASIX) 40 MG tablet Take 1 tablet (40 mg total) by mouth daily.  . insulin glargine (LANTUS SOLOSTAR) 100 UNIT/ML Solostar Pen INJECT  17  TO  20 UNITS SUBCUTANEOUSLY EVERY MORNING  . Insulin Pen Needle (B-D ULTRAFINE III SHORT PEN) 31G X 8 MM MISC USE AS DIRECTED WITH LANTUS SOLOSTAR dx. e11.9  . KLOR-CON M20 20 MEQ tablet TAKE ONE TABLET BY MOUTH DAILY  . Melatonin 5 MG CAPS Take 1 capsule by mouth at bedtime.   . metFORMIN (GLUCOPHAGE) 1000 MG tablet TAKE 1 TABLET TWICE DAILY  . metoprolol tartrate (LOPRESSOR) 25 MG tablet TAKE 1 TABLET TWICE DAILY  . pantoprazole (PROTONIX) 40 MG tablet Take 1 tablet (40 mg total) by mouth daily.  . rosuvastatin (CRESTOR) 40 MG tablet TAKE 1 TABLET EVERY DAY  . sertraline (ZOLOFT) 50 MG tablet Take 1 tablet (50 mg total) by mouth at bedtime.  Marland Kitchen SPIRIVA HANDIHALER 18 MCG inhalation capsule PLACE 1 CAPSULE INTO HANDIHALER AND INHALE CONTENTS OF 1 CAPSULE EVERY DAY AS DIRECTED AT 2 PM  . traMADol (ULTRAM) 50 MG tablet TAKE ONE TABLET BY MOUTH THREE TIMES A DAY AS NEEDED  . vitamin E 400 UNIT capsule Take 400 Units by mouth daily.    . nitroGLYCERIN (NITROSTAT) 0.4 MG SL tablet PLACE 1 TABLET (0.4 MG TOTAL) UNDER THE TONGUE EVERY 5 (FIVE) MINUTESAS NEEDED FOR CHEST PAIN (CHEST PAIN OR SHORTNESS OF BREATH). (Patient not taking: Reported on 05/25/2020)  . [DISCONTINUED] sertraline (ZOLOFT) 25 MG tablet TAKE 1 TABLET EVERY DAY (Patient not taking: Reported on 05/25/2020)  . [DISCONTINUED] sodium chloride (OCEAN) 0.65 % SOLN nasal spray Place 1 spray into both nostrils as needed for congestion. (Patient not taking: Reported on 05/25/2020)  . [DISCONTINUED] triamcinolone cream (KENALOG) 0.1 % Apply 1 application topically 2 (two) times daily. (Patient not taking: Reported on 05/25/2020)   No facility-administered encounter medications on file as of 05/25/2020.    Allergies (verified) Codeine sulfate and Morphine sulfate   History: Past Medical History:  Diagnosis  Date  . Anginal pain ([place])   . Arthritis   . AV block, 1st degree   . CAD (coronary artery disease) 1990; 2015   Cardiac cath 1990 with Dr. Lia Foyer and [name] Residual CAD, non obstructive per 2015 cath. STEMI status post stent in August 2015.  Marland Kitchen COPD (chronic obstructive pulmonary disease) ([place])   . Diabetes mellitus type 2, insulin dependent ([place])   . Essential hypertension   . Hyperlipidemia with target LDL less than 70   . Myocardial infarction ([place])   . Pericarditis-post MI (short course of steroids) 03/06/2014  . 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 Shortness of breath    Past Surgical History:  Procedure Laterality Date  . CATARACT EXTRACTION  2020  right and left eye   . CHOLECYSTECTOMY    . CORONARY ANGIOPLASTY WITH STENT PLACEMENT  02/18/14   Promus DES to RCA  . LEFT HEART CATHETERIZATION WITH CORONARY ANGIOGRAM N/A 02/18/2014   Procedure: LEFT HEART CATHETERIZATION WITH CORONARY ANGIOGRAM;  Surgeon: Jettie Booze, MD;  Location[facility] C[facility] LAB;  Service: Cardiovascular;  Laterality: N/A;  . PTCA  1990   PTCA of RCA  . TRANSTHORACIC ECHOCARDIOGRAM  02/02/2012   mild LVH, EF 55-60%, Normal WM, Gr 1 DD; Mild MR   Family History  Problem Relation Age of Onset  . Heart attack Mother 63  . Cancer Father 81  . Cancer Maternal Grandfather   . Cancer Paternal Grandmother   . Cancer Paternal Grandfather   . Diabetes Son   . Liver cancer Brother   . Bone cancer Brother   . Heart attack Son   . Hypertension Son   . Diabetes Son   . Hyperlipidemia Son    Social History   Socioeconomic History  . Marital status: Married    Spouse name: Not on file  . Number of children: 2  . Years of education: Not on file  . Highest education level: Not  on file  Occupational History  . Occupation: Retired    Fish farm manager: RETIRED  Tobacco Use  . Smoking status: Former Smoker    Packs/day: 1.00    Years: 55.00    Pack years: 55.00    Types: Cigarettes    Quit date: 09/29/2006    Years since quitting: 13.6  . Smokeless tobacco: Never Used  Vaping Use  . Vaping Use: Never used  Substance and Sexual Activity  . Alcohol use: No  . Drug use: No  . Sexual activity: Not Currently  Other Topics Concern  . Not on file  Social History Narrative   Lives with her son but he works all day. Home for dinner.  Independent ADL. Needs assist on some IADL.    Social Determinants of Health   Financial Resource Strain: Low Risk   . Difficulty of Paying Living Expenses: Not hard at all  Food Insecurity: No Food Insecurity  . Worried About Charity fundraiser in the Last Year: Never true  . Ran Out of Food in the Last Year: Never true  Transportation Needs: No Transportation Needs  . Lack of Transportation (Medical): No  . Lack of Transportation (Non-Medical): No  Physical Activity: Inactive  . Days of Exercise per Week: 0 days  . Minutes of Exercise per Session: 0 min  Stress: No Stress Concern Present  . Feeling of Stress : Not at all  Social Connections: Unknown  . Frequency of Communication with Friends and Family: Once a week  . Frequency of Social Gatherings with Friends and Family: Never  . Attends Religious Services: Never  . Active Member of Clubs or Organizations: No  . Attends Archivist Meetings: Never  . Marital Status: Not on file    Tobacco Counseling Counseling given: Not Answered   Clinical Intake:  Pre-visit preparation completed: Yes  Pain : No/denies pain     BMI - recorded: 30.45 Nutritional Status: BMI > 30  Obese Nutritional Risks: Nausea/ vomitting/ diarrhea (diarrhea) Diabetes: Yes CBG done?: Yes (126) CBG resulted in Enter/ Edit results?: No Did pt. bring in CBG monitor from home?: No  How  often do you need to have someone help you when you read instructions, pamphlets, or other written materials from your doctor or  pharmacy?: 1 - Never  Diabetic?Nutrition Risk Assessment:  Has the patient had any N/V/D within the last 2 months?  Yes  pt states diarrhea at times   Does the patient have any non-healing wounds?  No  Has the patient had any unintentional weight loss or weight gain?  No   Diabetes:  Is the patient diabetic?  Yes  If diabetic, was a CBG obtained today?  Yes  Did the patient bring in their glucometer from home?  No  How often do you monitor your CBG's? Daily.   Financial Strains and Diabetes Management:  Are you having any financial strains with the device, your supplies or your medication? No .  Does the patient want to be seen by Chronic Care Management for management of their diabetes?  No  Would the patient like to be referred to a Nutritionist or for Diabetic Management?  No   Diabetic Exams:  Diabetic Eye Exam: Overdue for diabetic eye exam. Pt has been advised about the importance in completing this exam. Patient advised to call and schedule an eye exam. Diabetic Foot Exam: Overdue, Pt has been advised about the importance in completing this exam. Pt is scheduled for diabetic foot exam on 06/25/20.   Interpreter Needed?: No  Information entered by :: Charlott Rakes, LPN   Activities of Daily Living In your present state of health, do you have any difficulty performing the following activities: 05/25/2020  Hearing? Y  Comment mild loss  Vision? N  Difficulty concentrating or making decisions? Y  Comment at times  Walking or climbing stairs? Y  Dressing or bathing? N  Comment related to knees  Doing errands, shopping? N  Preparing Food and eating ? N  Using the Toilet? N  In the past six months, have you accidently leaked urine? Y  Comment urgency  Do you have problems with loss of bowel control? Y  Managing your Medications? N  Comment  diarrhea at times  Managing your Finances? N  Housekeeping or managing your Housekeeping? N  Some recent data might be hidden    Patient Care Team: [name], MD as PCP - General (Family Medicine) Burnell Blanks, MD as PCP - Cardiology (Cardiology) Karin Golden, MD as Consulting Physician (Dermatology) Bernarda Caffey, MD as Consulting Physician (Ophthalmology) Madelin Rear, [facility] as Pharmacist (Pharmacist)  Indicate any recent Medical Services you may have received from other than [facility] providers in the past year (date may be approximate).     Assessment:   This is a routine wellness examination for [place].  Hearing/Vision screen  Hearing Screening   '125Hz'  '250Hz'  '500Hz'  '1000Hz'  '2000Hz'  '3000Hz'  '4000Hz'  '6000Hz'  '8000Hz'   Right ear:           Left ear:           Comments: Mild loss   Vision Screening Comments: Not seeing  one at this time  Dietary issues and exercise activities discussed: Current Exercise Habits: The patient does not participate in regular exercise at present, Exercise limited by: orthopedic condition(s)  Goals    . Exercise 150 minutes per week (moderate activity)     Exercise in the chair Get up from the chair 3 times ; 3 times a day Walk in the home a couple times a day     . Patient Stated     Exercise     . PharmD Care Plan     CARE PLAN ENTRY (see longitudinal plan of care for additional care plan information)  Current Barriers:  . Chronic Disease Management support, education, and care coordination needs related to Hypertension, Hyperlipidemia, Diabetes, and COPD   Hypertension BP Readings from Last 3 Encounters:  02/18/20 130/70  11/21/19 140/62  07/23/19 130/78   . Pharmacist Clinical Goal(s): o Over the next 180 days, patient will work with PharmD and providers to maintain BP goal <130/80 . Current regimen:  . Metoprolol tartrate 25 mg twice daily (acts on heart rate and lowers blood pressure) . Lasix 40 mg daily (helps primarily  with fluid retention - known as "water pill") . Enalapril 20 mg once daily (lowers blood pressure and also can offer kidney protection in patients with diabetes) . Interventions: o Diet / exercise recommendations  . Patient self care activities - Over the next 180 days, patient will: o Check BP once every 1-2 weeks, document, and provide at future appointments o Bring BP cuff to upcoming December physical to compare to office readings for accuracy o Ensure daily salt intake < 2000 mg/day o Focus on intake of white meats like skinless chicken, fish, ground [place] over red meats  Hyperlipidemia Lipid Panel     Component Value Date/Time   CHOL 128 02/18/2020 1529   TRIG 355 (H) 02/18/2020 1529   TRIG 1693 (HH) 07/28/2006 1202   HDL 49 (L) 02/18/2020 1529   CHOLHDL 2.6 02/18/2020 1529   VLDL UNABLE TO CALCULATE IF TRIGLYCERIDE OVER 400 mg/dL 02/19/2014 0228   LDLCALC 42 02/18/2020 1529   LDLDIRECT 40.0 07/23/2019 1445   . Pharmacist Clinical Goal(s): o Over the next 180 days, patient will work with PharmD and providers to maintain LDL goal < 70 . Current regimen:  o Fenofibrate 134 mg once daily (lowers triglycerides - noted as "TRIG" above) o Simvastatin 40 mg once daily (effectively lowers LDL/"lousy" cholesterol, increases HDL/"Happy" cholesterol, also lowers triglycerides) (can prevent heart attacks and stroke) Interventions: o Diet / exercise recommendations - see blood pressure section . Patient self care activities - Over the next 180 days, patient will: o Diet / exercise recommendations - see blood pressure section  Diabetes Lab Results  Component Value Date/Time   HGBA1C 6.6 (H) 02/18/2020 03:29 PM   HGBA1C 6.6 (H) 11/21/2019 03:42 PM   . Pharmacist Clinical Goal(s): o Over the next 180 days, patient will work with PharmD and providers to maintain A1c goal <7% . Current regimen:   Lantus 17 to 20 units every morning  Metformin 1000 mg twice  daily . Interventions: o Reviewed low blood sugar and correction methods - Rule of 15 - Recommend purchasing blood glucose tablets to have on hand - can purchase at most pharmacies . Patient self care activities - Over the next 180 days, patient will: o Check blood sugar once daily, document, and provide at future appointments o Contact provider with any episodes of hypoglycemia (blood glucose <70)  COPD . Pharmacist Clinical Goal(s) o Over the next 180 days, patient will work with PharmD and providers to minimize symptoms of COPD and ensure medication safety . Current regimen:   Spiriva Handihaler - inhale contents of capsule once daily (Maintenance - used daily to prevent symptoms of COPD)  Advair 250-50 mcg/dose - one puff twice daily (Maintenance - used daily to prevent symptoms of COPD)  Ventolin HFA - inhale 1 puff into the lungs every 4 hours as needed for wheezing or shortness of breath (Rescue - typically used to relieve active symptoms of COPD, if using routinely or multiple times per week, may indicate COPD  is not well controlled) . Interventions: o Reviewed inhaler technique, counseled on each o Reviewed instructions for twice daily advair o Requested new advair rx . Patient self care activities - Over the next 180 days, patient will: o Ensure consistent use of maintenance inhalers - Spriva once daily, Advair twice daily o Report any changes in breathing or frequent use of rescue inhaler - albuterol (Ventolin)  Medication management . Pharmacist Clinical Goal(s): o Over the next 180 days, patient will work with PharmD and providers to maintain optimal medication adherence . Current pharmacy: [facility] . Interventions o Comprehensive medication review performed. o Requested for all Rxs to be filled through [facility] moving forward . Patient self care activities - Over the next 180 days, patient will: o Take medications as prescribed o Report any questions or  concerns to PharmD and/or provider(s) Initial goal documentation.      Depression Screen PHQ 2/9 Scores 05/25/2020 02/18/2020 11/21/2019 07/23/2019 05/20/2019 02/22/2019 05/16/2018  PHQ - 2 Score 0 0 0 0 0 0 0  PHQ- 9 Score - '3 7 3 ' - 8 -    Fall Risk Fall Risk  05/25/2020 05/20/2019 02/22/2019 05/16/2018 04/14/2017  Falls in the past year? 1 0 0 Yes No  Number falls in past yr: 1 - 0 2 or more -  Injury with Fall? 0 0 0 No -  Risk for fall due to : Impaired mobility;Impaired vision;Impaired balance/gait;Orthopedic patient - Impaired balance/gait;Impaired mobility Impaired balance/gait;Impaired mobility -  Follow up Falls prevention discussed Falls evaluation completed;Education provided;Falls prevention discussed - - -    Any stairs in or around the home? Yes  If so, are there any without handrails? No  Home free of loose throw rugs in walkways, pet beds, electrical cords, etc? Yes  Adequate lighting in your home to reduce risk of falls? Yes   ASSISTIVE DEVICES UTILIZED TO PREVENT FALLS:  Life alert? No  Use of a cane, walker or w/c? Yes  Grab bars in the bathroom? Yes  Shower chair or bench in shower? Yes  Elevated toilet seat or a handicapped toilet? Yes   TIMED UP AND GO:  Was the test performed? No .      Cognitive Function: MMSE - Mini Mental State Exam 04/14/2017  Not completed: (No Data)     6CIT Screen 05/25/2020 05/20/2019 05/16/2018  What Year? 0 points 0 points 0 points  What month? 0 points 0 points 0 points  What time? - 0 points 0 points  Count back from 20 0 points 0 points 0 points  Months in reverse 4 points 0 points 0 points  Repeat phrase 10 points 2 points 0 points  Total Score - 2 0    Immunizations Immunization History  Administered Date(s) Administered  . Influenza Split 04/21/2011, 05/16/2012  . Influenza Whole 05/24/2007, 05/01/2009, 04/17/2010  . Influenza, High Dose Seasonal PF 03/23/2018, 05/25/2019  . Influenza,inj,Quad PF,6+ Mos 04/14/2013,  04/10/2014, 04/03/2015  . Influenza-Unspecified 06/01/2016, 06/15/2017, 05/24/2020  . Moderna SARS-COVID-2 Vaccination 10/25/2019, 11/22/2019  . Pneumococcal Conjugate-13 09/22/2014, 05/25/2019  . Pneumococcal Polysaccharide-23 04/18/2003  . Tdap 07/29/2011  . Zoster 04/18/2011    TDAP status: Up to date Done 05/24/20  Flu Vaccine status: Up to date   Pneumococcal vaccine status: Up to date Covid-19 vaccine status: Completed vaccines  Qualifies for Shingles Vaccine? Yes   Zostavax completed Yes   Shingrix Completed?: No.    Education has been provided regarding the importance of this vaccine. Patient has been  advised to call insurance company to determine out of pocket expense if they have not yet received this vaccine. Advised may also receive vaccine at local pharmacy or Health Dept. Verbalized acceptance and understanding.  Screening Tests Health Maintenance  Topic Date Due  . FOOT EXAM  06/25/2020 (Originally 02/22/2020)  . OPHTHALMOLOGY EXAM  06/25/2020 (Originally 04/28/2018)  . DEXA SCAN  04/21/2043 (Originally 12/31/1999)  . HEMOGLOBIN A1C  08/20/2020  . TETANUS/TDAP  07/28/2021  . INFLUENZA VACCINE  Completed  . COVID-19 Vaccine  Completed  . PNA vac Low Risk Adult  Completed    Health Maintenance  There are no preventive care reminders to display for this patient.  Colorectal cancer screening: No longer required.  Mammogram status: No longer required.     Additional Screening:   Vision Screening: Recommended annual ophthalmology exams for early detection of glaucoma and other disorders of the eye. Is the patient up to date with their annual eye exam?  No  Who is the provider or what is the name of the office in which the patient attends annual eye exams? Will follow up with Dr Yong Channel   Dental Screening: Recommended annual dental exams for proper oral hygiene  Community Resource Referral / Chronic Care Management: CRR required this visit?  No   CCM required  this visit?  No      Plan:     I have personally reviewed and noted the following in the patient's chart:   . Medical and social history . Use of alcohol, tobacco or illicit drugs  . Current medications and supplements . Functional ability and status . Nutritional status . Physical activity . Advanced directives . List of other physicians . Hospitalizations, surgeries, and ER visits in previous 12 months . Vitals . Screenings to include cognitive, depression, and falls . Referrals and appointments  In addition, I have reviewed and discussed with patient certain preventive protocols, quality metrics, and best practice recommendations. A written personalized care plan for preventive services as well as general preventive health recommendations were provided to patient.     Willette Brace, LPN   [id number]   Nurse Notes: Pt will follow up with Dr Yong Channel for eye and foot exams at 06/25/20 appt

## 2020-05-27 ENCOUNTER — Telehealth: Payer: Self-pay

## 2020-05-27 NOTE — Telephone Encounter (Signed)
[name] is calling in states that [facility] has sent over a request on 10/18 for a new glucose meter kit as the old one is no longer working but has yet to hear anything.

## 2020-05-28 ENCOUNTER — Other Ambulatory Visit: Payer: Self-pay

## 2020-05-28 MED ORDER — BD PEN NEEDLE SHORT U/F 31G X 8 MM MISC
3 refills | Status: DC
Start: 2020-05-28 — End: 2020-08-04

## 2020-05-28 MED ORDER — ACCU-CHEK SMARTVIEW VI STRP
ORAL_STRIP | 1 refills | Status: DC
Start: 2020-05-28 — End: 2020-10-23

## 2020-05-28 MED ORDER — BLOOD GLUCOSE MONITOR KIT
PACK | 0 refills | Status: DC
Start: 2020-05-28 — End: 2021-04-01

## 2020-05-28 MED ORDER — BLOOD GLUCOSE MONITOR KIT
PACK | 0 refills | Status: DC
Start: 2020-05-28 — End: 2020-05-28

## 2020-05-28 NOTE — Telephone Encounter (Signed)
Medication has been sent to the patient's pharmacy.  

## 2020-05-29 ENCOUNTER — Other Ambulatory Visit: Payer: Self-pay | Admitting: Family Medicine

## 2020-06-02 NOTE — Telephone Encounter (Signed)
blood glucose meter kit and supplies KIT  Pharmacy states that have not received prescriptions for anything listed above, nor the actual meter.

## 2020-06-02 NOTE — Telephone Encounter (Signed)
Medication was sent to [facility] on 05/28/2020

## 2020-06-02 NOTE — Telephone Encounter (Signed)
Can you follow up on this please. °

## 2020-06-24 ENCOUNTER — Other Ambulatory Visit: Payer: Self-pay

## 2020-06-24 ENCOUNTER — Encounter (HOSPITAL_COMMUNITY): Payer: Self-pay | Admitting: Emergency Medicine

## 2020-06-24 ENCOUNTER — Telehealth: Payer: Self-pay

## 2020-06-24 ENCOUNTER — Emergency Department (HOSPITAL_COMMUNITY): Payer: Medicare HMO

## 2020-06-24 ENCOUNTER — Observation Stay (HOSPITAL_COMMUNITY)
Admission: EM | Admit: 2020-06-24 | Discharge: 2020-06-25 | Disposition: A | Discharge status: Home / Self Care | Payer: Medicare HMO | Attending: Internal Medicine | Admitting: Internal Medicine

## 2020-06-24 DIAGNOSIS — E1159 Type 2 diabetes mellitus with other circulatory complications: Secondary | ICD-10-CM | POA: Diagnosis present

## 2020-06-24 DIAGNOSIS — Z955 Presence of coronary angioplasty implant and graft: Secondary | ICD-10-CM | POA: Insufficient documentation

## 2020-06-24 DIAGNOSIS — R442 Other hallucinations: Secondary | ICD-10-CM | POA: Diagnosis not present

## 2020-06-24 DIAGNOSIS — Z7982 Long term (current) use of aspirin: Secondary | ICD-10-CM | POA: Insufficient documentation

## 2020-06-24 DIAGNOSIS — N182 Chronic kidney disease, stage 2 (mild): Secondary | ICD-10-CM | POA: Insufficient documentation

## 2020-06-24 DIAGNOSIS — R443 Hallucinations, unspecified: Secondary | ICD-10-CM | POA: Diagnosis not present

## 2020-06-24 DIAGNOSIS — I5032 Chronic diastolic (congestive) heart failure: Secondary | ICD-10-CM | POA: Insufficient documentation

## 2020-06-24 DIAGNOSIS — E785 Hyperlipidemia, unspecified: Secondary | ICD-10-CM | POA: Diagnosis present

## 2020-06-24 DIAGNOSIS — R079 Chest pain, unspecified: Secondary | ICD-10-CM | POA: Diagnosis not present

## 2020-06-24 DIAGNOSIS — F05 Delirium due to known physiological condition: Secondary | ICD-10-CM | POA: Insufficient documentation

## 2020-06-24 DIAGNOSIS — J90 Pleural effusion, not elsewhere classified: Secondary | ICD-10-CM | POA: Diagnosis not present

## 2020-06-24 DIAGNOSIS — R4182 Altered mental status, unspecified: Secondary | ICD-10-CM | POA: Diagnosis not present

## 2020-06-24 DIAGNOSIS — Z20822 Contact with and (suspected) exposure to covid-19: Secondary | ICD-10-CM | POA: Insufficient documentation

## 2020-06-24 DIAGNOSIS — R410 Disorientation, unspecified: Secondary | ICD-10-CM | POA: Diagnosis present

## 2020-06-24 DIAGNOSIS — Z87891 Personal history of nicotine dependence: Secondary | ICD-10-CM | POA: Diagnosis not present

## 2020-06-24 DIAGNOSIS — Z7984 Long term (current) use of oral hypoglycemic drugs: Secondary | ICD-10-CM | POA: Diagnosis not present

## 2020-06-24 DIAGNOSIS — I2510 Atherosclerotic heart disease of native coronary artery without angina pectoris: Secondary | ICD-10-CM | POA: Insufficient documentation

## 2020-06-24 DIAGNOSIS — E119 Type 2 diabetes mellitus without complications: Secondary | ICD-10-CM

## 2020-06-24 DIAGNOSIS — Z794 Long term (current) use of insulin: Secondary | ICD-10-CM | POA: Diagnosis not present

## 2020-06-24 DIAGNOSIS — R030 Elevated blood-pressure reading, without diagnosis of hypertension: Secondary | ICD-10-CM

## 2020-06-24 DIAGNOSIS — R0789 Other chest pain: Secondary | ICD-10-CM | POA: Diagnosis not present

## 2020-06-24 DIAGNOSIS — J449 Chronic obstructive pulmonary disease, unspecified: Secondary | ICD-10-CM | POA: Diagnosis not present

## 2020-06-24 DIAGNOSIS — Z79899 Other long term (current) drug therapy: Secondary | ICD-10-CM | POA: Insufficient documentation

## 2020-06-24 DIAGNOSIS — E1122 Type 2 diabetes mellitus with diabetic chronic kidney disease: Secondary | ICD-10-CM | POA: Insufficient documentation

## 2020-06-24 DIAGNOSIS — I10 Essential (primary) hypertension: Secondary | ICD-10-CM | POA: Diagnosis present

## 2020-06-24 DIAGNOSIS — I130 Hypertensive heart and chronic kidney disease with heart failure and stage 1 through stage 4 chronic kidney disease, or unspecified chronic kidney disease: Secondary | ICD-10-CM | POA: Diagnosis not present

## 2020-06-24 DIAGNOSIS — E1169 Type 2 diabetes mellitus with other specified complication: Secondary | ICD-10-CM | POA: Diagnosis present

## 2020-06-24 LAB — COMPREHENSIVE METABOLIC PANEL
ALT: 19 U/L (ref 0–44)
AST: 21 U/L (ref 15–41)
Albumin: 4.1 g/dL (ref 3.5–5.0)
Alkaline Phosphatase: 49 U/L (ref 38–126)
Anion gap: 13 (ref 5–15)
BUN: 13 mg/dL (ref 8–23)
CO2: 28 mmol/L (ref 22–32)
Calcium: 10 mg/dL (ref 8.9–10.3)
Chloride: 100 mmol/L (ref 98–111)
Creatinine, Ser: 0.79 mg/dL (ref 0.44–1.00)
GFR, Estimated: >60 mL/min (ref >60)
Glucose, Bld: 106 mg/dL — ABNORMAL HIGH (ref 70–99)
Potassium: 3.4 mmol/L — ABNORMAL LOW (ref 3.5–5.1)
Sodium: 141 mmol/L (ref 135–145)
Total Bilirubin: 0.8 mg/dL (ref 0.3–1.2)
Total Protein: 7.7 g/dL (ref 6.5–8.1)

## 2020-06-24 LAB — CBC
HCT: 43.2 % (ref 36.0–46.0)
Hemoglobin: 14.9 g/dL (ref 12.0–15.0)
MCH: 30.2 pg (ref 26.0–34.0)
MCHC: 34.5 g/dL (ref 30.0–36.0)
MCV: 87.6 fL (ref 80.0–100.0)
Platelets: 305 10*3/uL (ref 150–400)
RBC: 4.93 MIL/uL (ref 3.87–5.11)
RDW: 12.9 % (ref 11.5–15.5)
WBC: 10.3 10*3/uL (ref 4.0–10.5)
nRBC: 0 % (ref 0.0–0.2)

## 2020-06-24 LAB — URINALYSIS, ROUTINE W REFLEX MICROSCOPIC
Bilirubin Urine: NEGATIVE
Glucose, UA: NEGATIVE mg/dL
Ketones, ur: NEGATIVE mg/dL
Leukocytes,Ua: NEGATIVE
Nitrite: NEGATIVE
Protein, ur: >=300 mg/dL — AB
Specific Gravity, Urine: 1.014 (ref 1.005–1.030)
pH: 5 (ref 5.0–8.0)

## 2020-06-24 IMAGING — CR DG CHEST 2V
2 series · 2 of 2 positions shown · non-contrast
Comparison: [DATE]

CLINICAL DATA: Chest pain altered

EXAM:
CHEST - 2 VIEW

[chest lat]
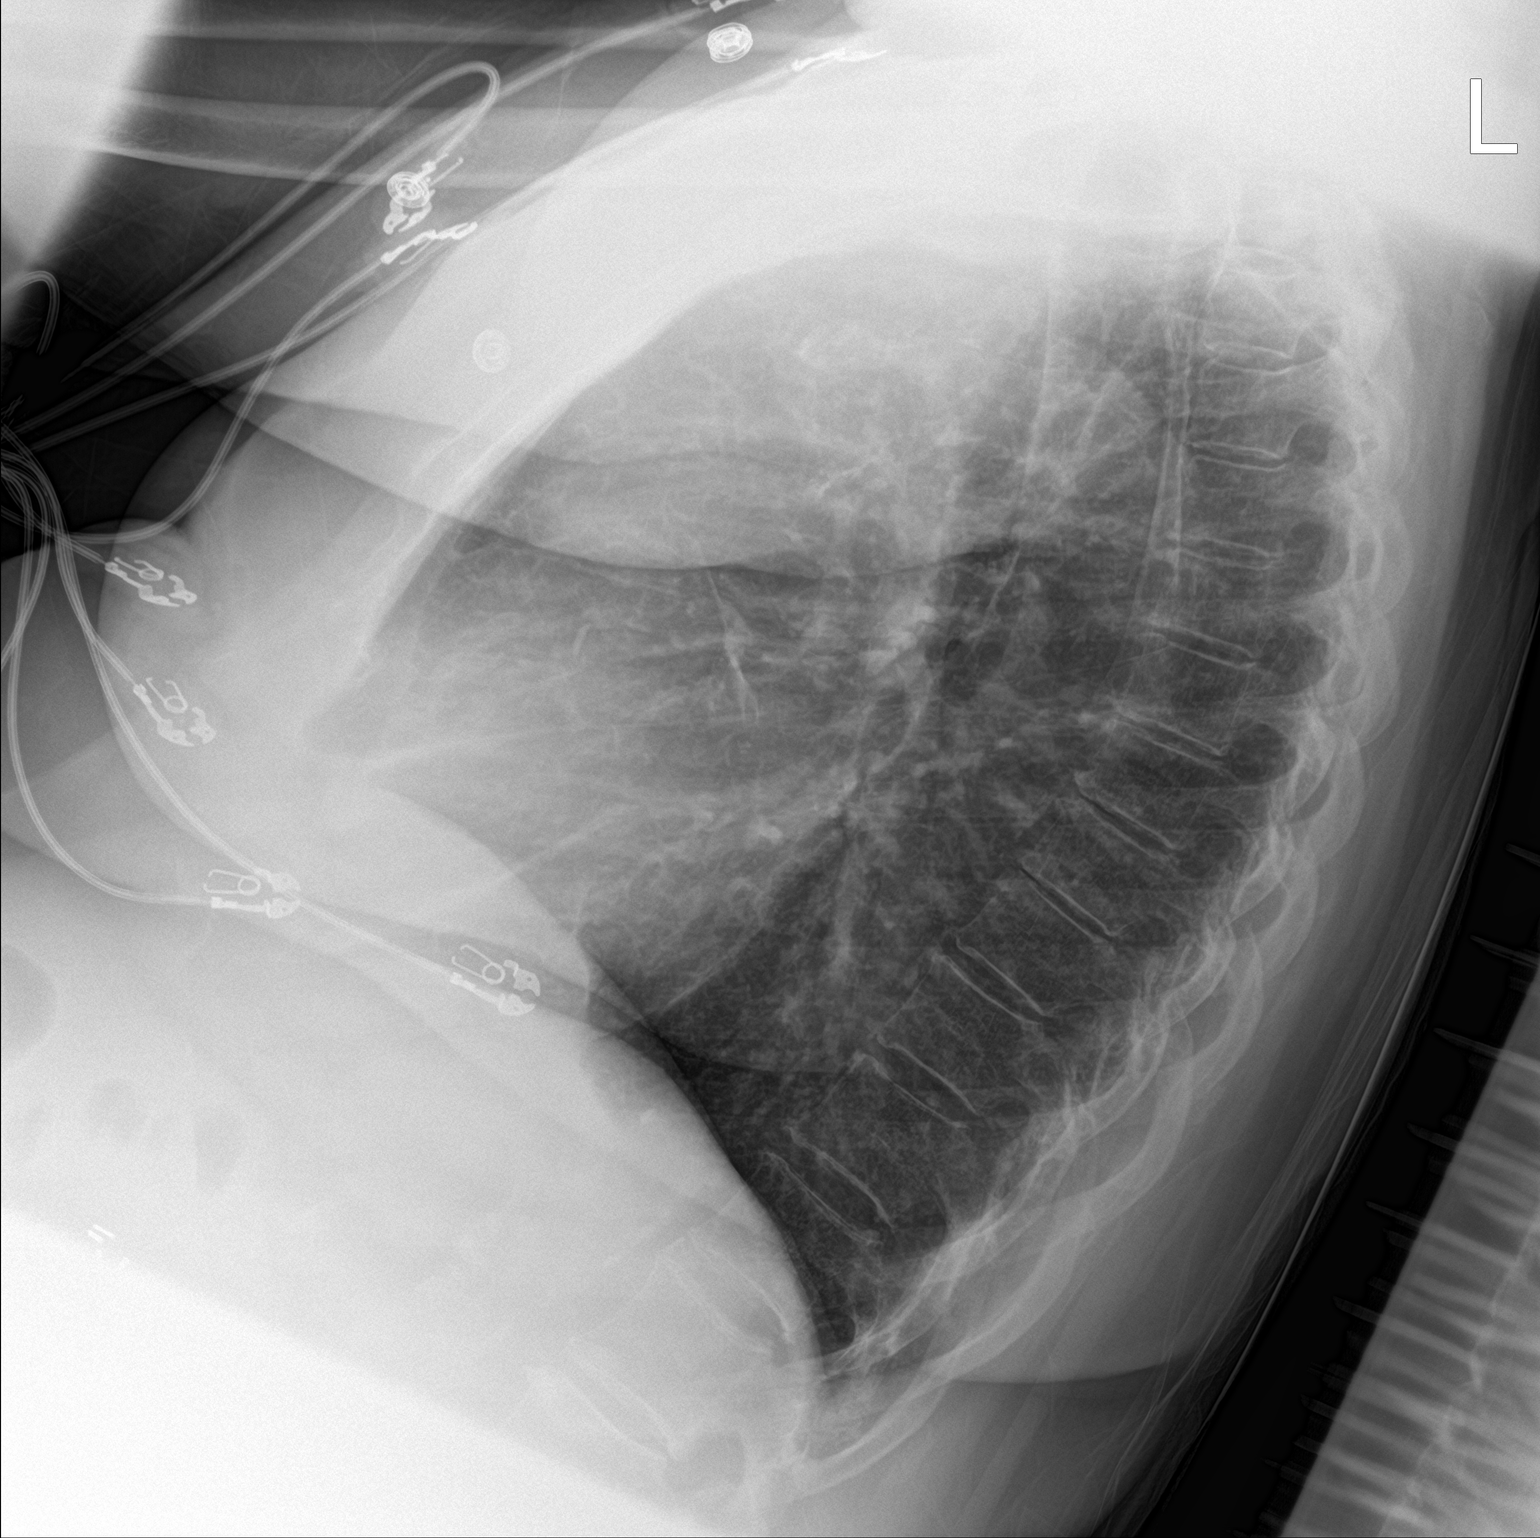

[chest ap]
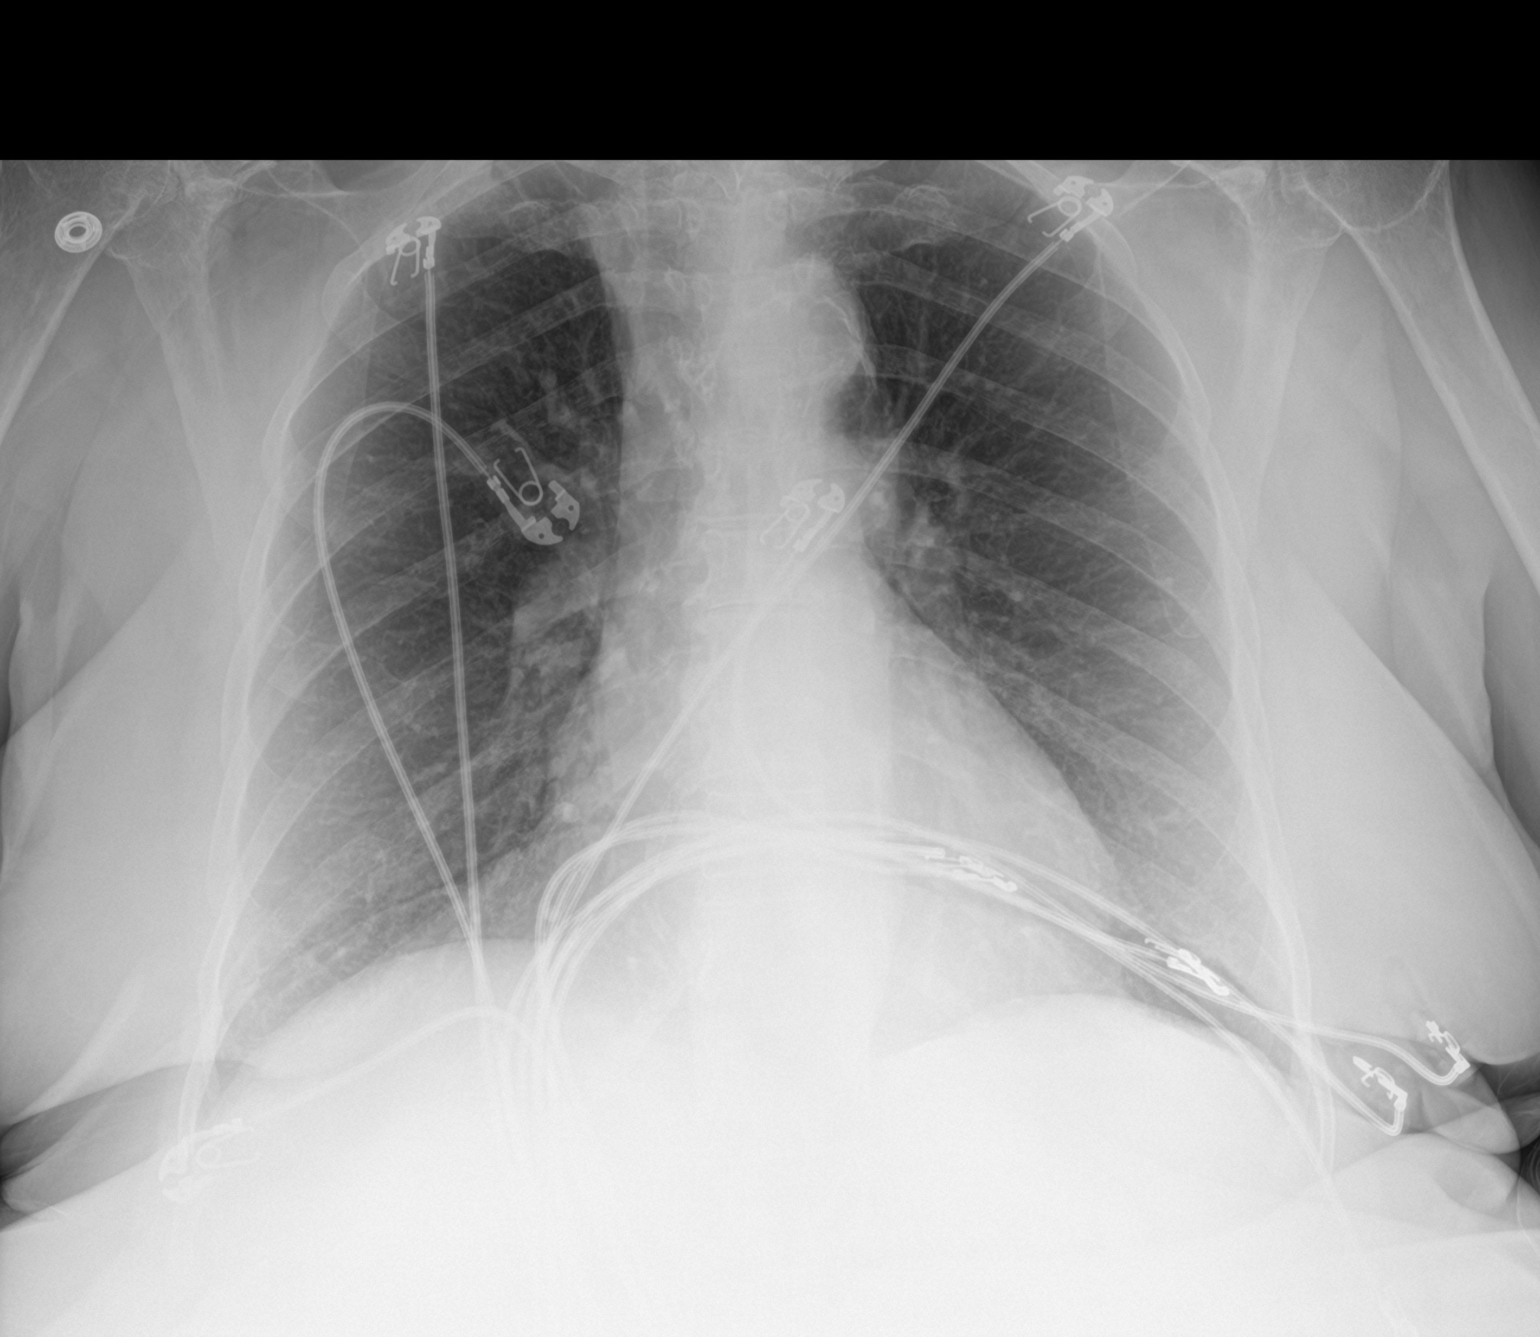

[2 of 2 positions shown; findings below may reference images not displayed]

FINDINGS: No focal opacity or pleural effusion. Normal cardiomediastinal
silhouette with aortic atherosclerosis. No pneumothorax.
IMPRESSION: No active cardiopulmonary disease.

## 2020-06-24 IMAGING — CT CT HEAD W/O CM
4 series · 17 of 47 positions shown, 19 images · non-contrast
Comparison: None.

CLINICAL DATA: Altered mental status.

EXAM:
CT HEAD WITHOUT CONTRAST
TECHNIQUE: Contiguous axial images were obtained from the base of the skull
through the vertex without intravenous contrast.

[Series 3: head wo · axial · 0.39mm/px · z∈[-106,+4]mm · 7 of 30 slices shown, 9 images]
[im 4/30  brain]
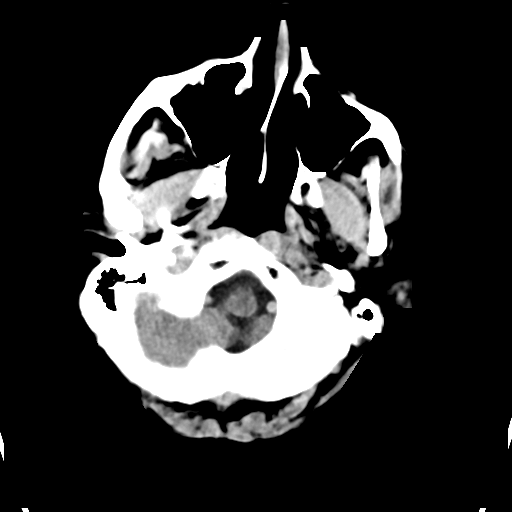
[im 4/30  bone]
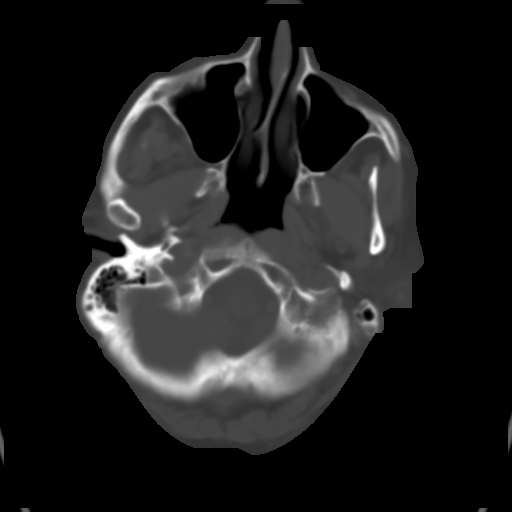
[im 8/30  brain]
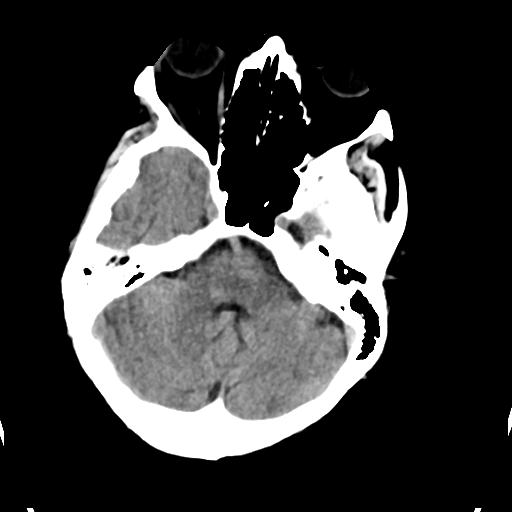
[im 11/30  brain]
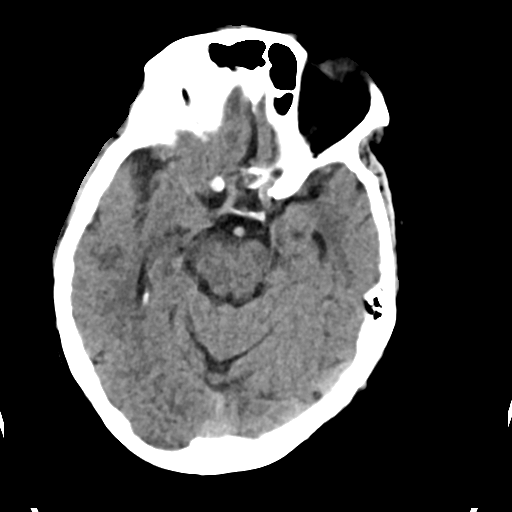
[im 15/30  brain]
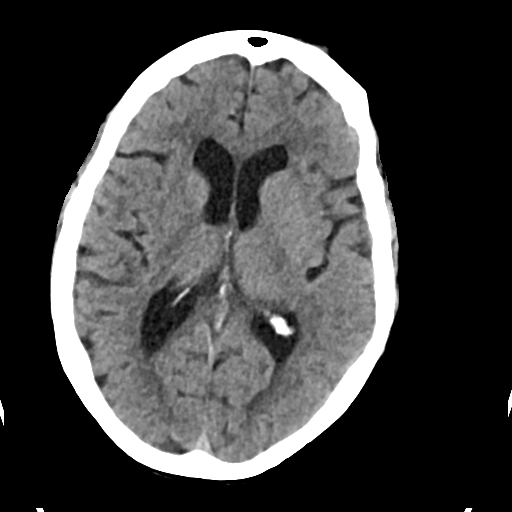
[im 19/30  brain]
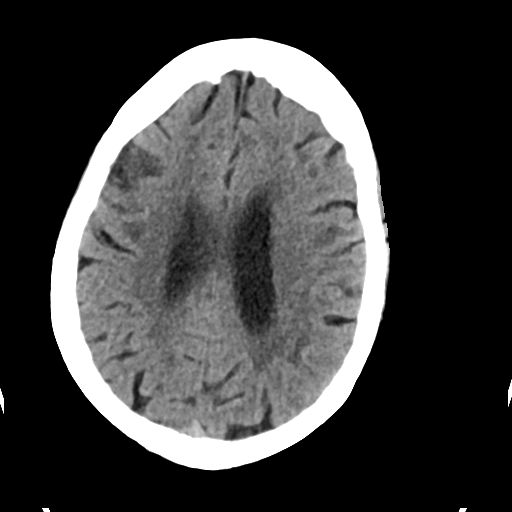
[im 19/30  bone]
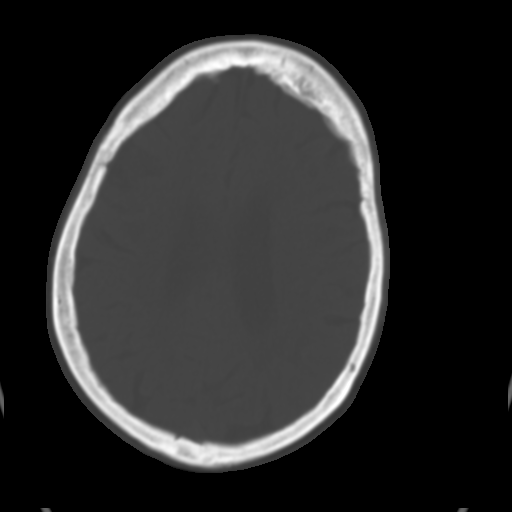
[im 22/30  brain]
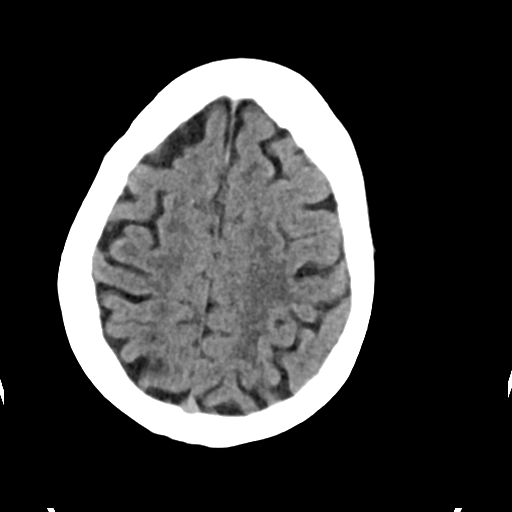
[im 26/30  brain]
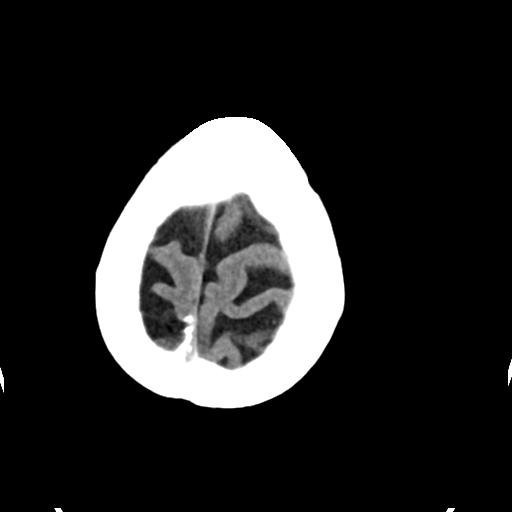

[Series 4: head bone · axial · 0.39mm/px · z∈[-107,-55]mm · 4 of 75 slices shown]
[im 8/75  bone]
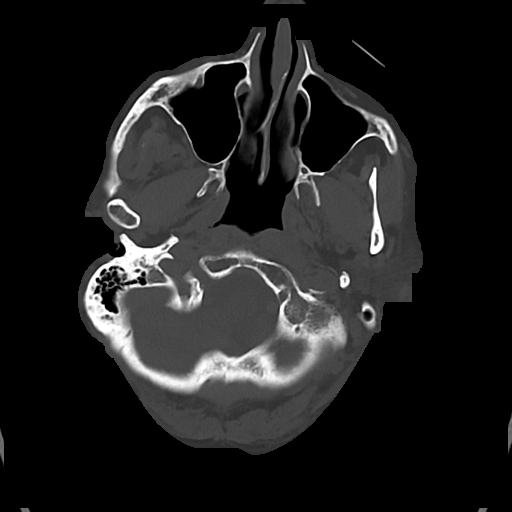
[im 15/75  bone]
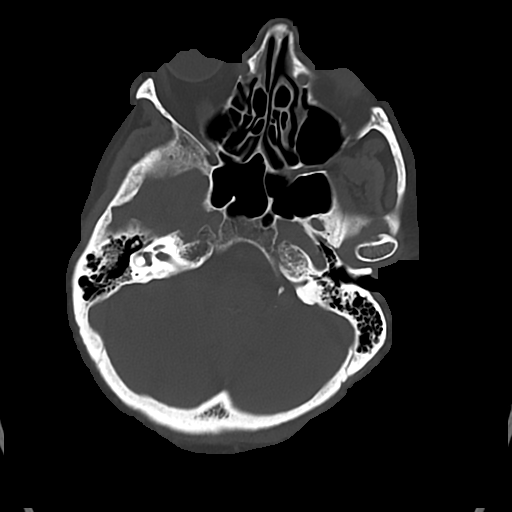
[im 23/75  bone]
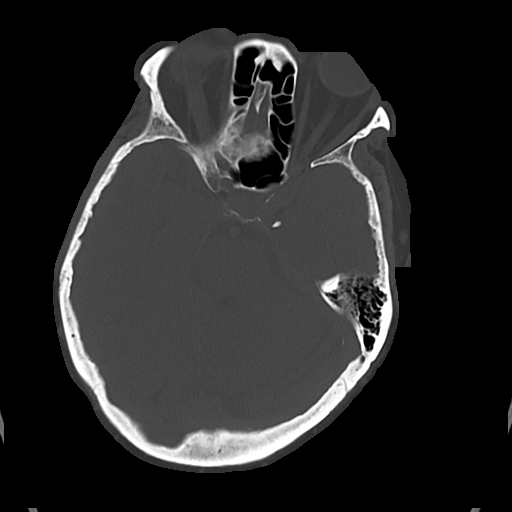
[im 34/75  bone]
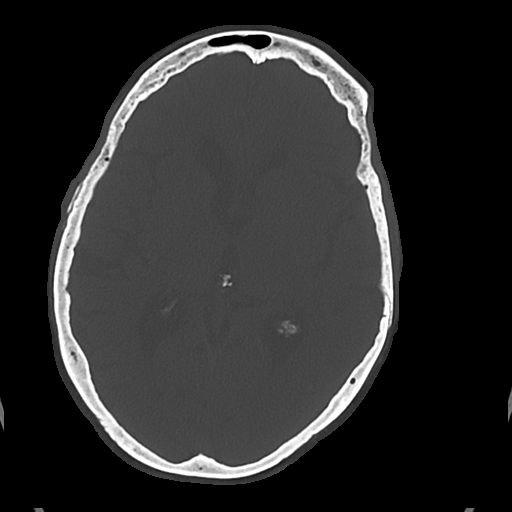

[Series 5: cor soft · coronal · 0.30mm/px · 3 of 67 slices shown]
[im 23/67  brain]
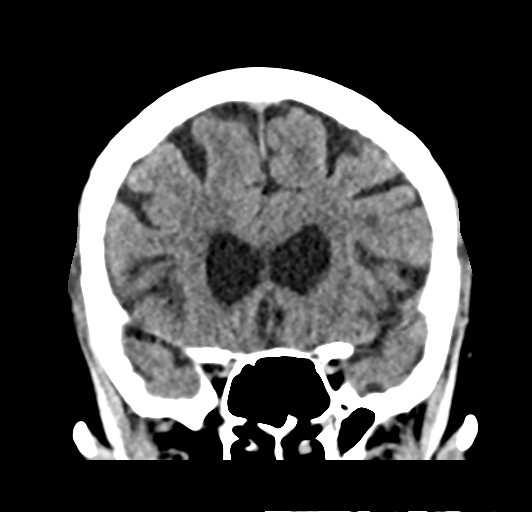
[im 30/67  brain]
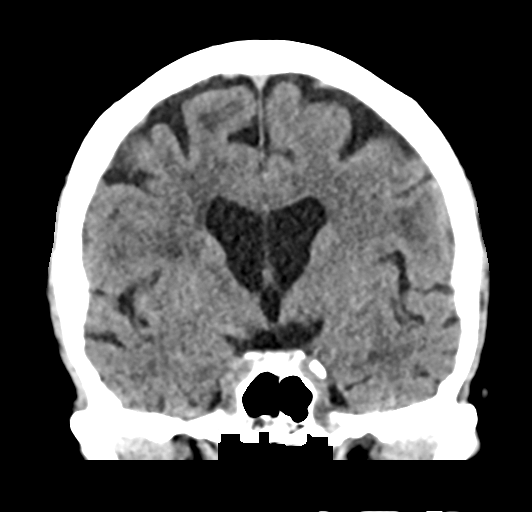
[im 37/67  brain]
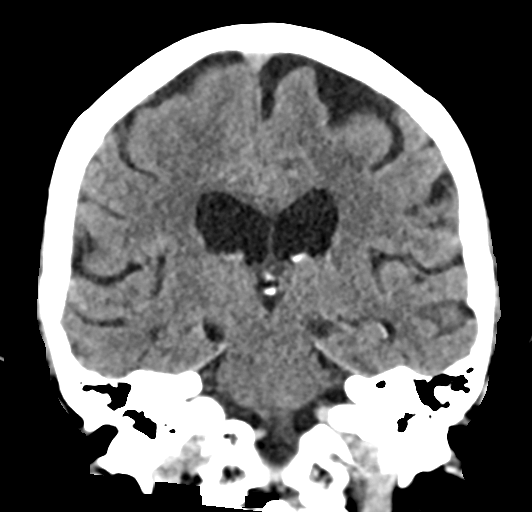

[Series 6: sag soft · sagittal · 0.29mm/px · 3 of 52 slices shown]
[im 18/52  brain]
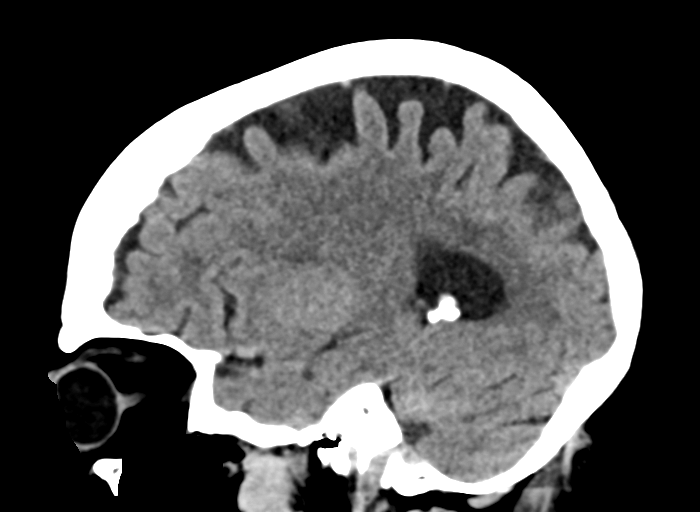
[im 26/52  brain]
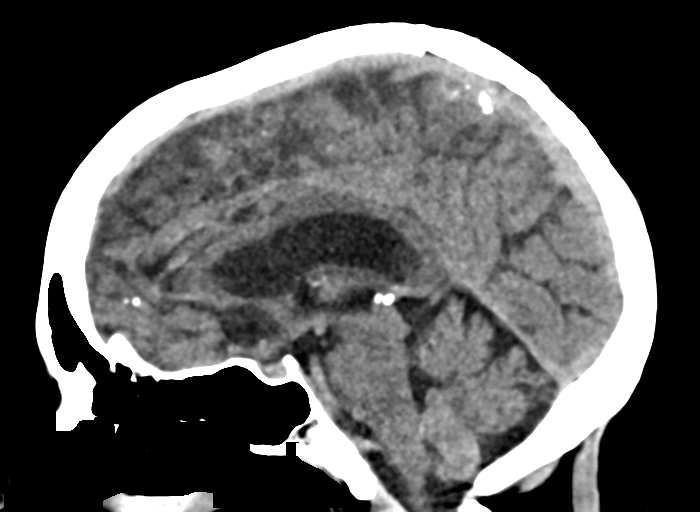
[im 35/52  brain]
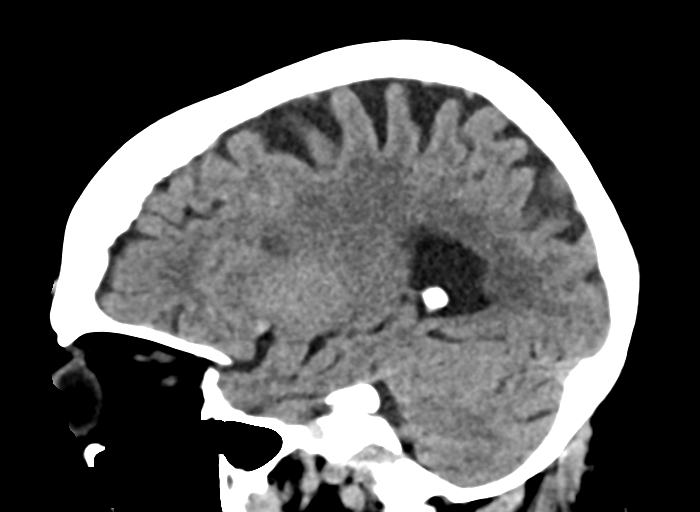

[17 of 47 positions shown; findings below may reference images not displayed]

FINDINGS: Brain: There is mild to moderate severity cerebral atrophy with
widening of the extra-axial spaces and ventricular dilatation.
There are areas of decreased attenuation within the white matter
tracts of the supratentorial brain, consistent with microvascular
disease changes.

Vascular: No hyperdense vessel or unexpected calcification.

Skull: Normal. Negative for fracture or focal lesion.

Sinuses/Orbits: No acute finding.

Other: None.
IMPRESSION: 1. Generalized cerebral atrophy.
2. No acute intracranial abnormality.

## 2020-06-24 MED ORDER — LABETALOL HCL 5 MG/ML IV SOLN
5.0000 mg | Freq: Once | INTRAVENOUS | Status: AC
  Administered 2020-06-25: 5 mg via INTRAVENOUS
  Filled 2020-06-24: qty 4

## 2020-06-24 NOTE — ED Triage Notes (Addendum)
Patient brought in by family for concern of altered mental status. Son states at baseline patient is oriented x4 but somewhat forgetful. Son states on Saturday patient did not recognize either of her sons. Patient alert and oriented x4 at this time, but son states patient's orientation has waxed and waned while in waiting room.

## 2020-06-24 NOTE — ED Provider Notes (Signed)
11:27 PM  Assumed care.  Confusion since Saturday.  Talking to people who have passed.  Having hallucinations.  Waxes and wanes.  While being here has developed chest pain.  Cardiac labs and CXR pending.  Needs admission per primary team.  2:19 AM  Pt's first troponin is 7.  Ammonia level minimally elevated at 43.  TSH 1.68.  Patient still intermittently confused and hallucinating.  No other focal neurologic deficits.  No active chest pain at this time.  Will discuss with hospitalist.  PCP with [facility].  2:25 AM Discussed patient's case with hospitalist, Dr. Alcario Drought.  I have recommended admission and patient (and family if present) agree with this plan. Admitting physician will place admission orders.   I reviewed all nursing notes, vitals, pertinent previous records and reviewed/interpreted all EKGs, lab and urine results, imaging (as available).     EKG Interpretation  Date/Time:  Wednesday June 24 2020 23:19:20 EST Ventricular Rate:  83 PR Interval:    QRS Duration: 106 QT Interval:  381 QTC Calculation: 448 R Axis:   -42 Text Interpretation: Sinus rhythm Prolonged PR interval Left anterior fascicular block Abnormal R-wave progression, late transition when compared to prior, similar apperance. No STEMI Confirmed by Antony Blackbird [phone]) on 06/24/2020 11:21:28 PM         Vint Pola, Delice Bison, DO 06/25/20 0225

## 2020-06-24 NOTE — ED Notes (Addendum)
Pt assisted to the bathroom. Pt wants a update on whats going on. Pt also wanted to know about eating. I did notified the RN about the situation. MD was notified as well.

## 2020-06-24 NOTE — Progress Notes (Unsigned)
Phone [phone]   Subjective:  Patient presents today for their annual physical. Chief complaint-noted.   See problem oriented charting- ROS- full  review of systems was completed and negative except for: ***  The following were reviewed and entered/updated in epic: Past Medical History:  Diagnosis Date  . Anginal pain ([place])   . Arthritis   . AV block, 1st degree   . CAD (coronary artery disease) 1990; 2015   Cardiac cath 1990 with Dr. Lia Foyer and [name] Residual CAD, non obstructive per 2015 cath. STEMI status post stent in August 2015.  Marland Kitchen COPD (chronic obstructive pulmonary disease) ([place])   . Diabetes mellitus type 2, insulin dependent ([place])   . Essential hypertension   . Hyperlipidemia with target LDL less than 70   . Myocardial infarction ([place])   . Pericarditis-post MI (short course of steroids) 03/06/2014  . 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 Shortness of breath    Patient Active Problem List   Diagnosis Date Noted  . Actinic keratosis 11/17/2017  . Anemia, iron deficiency 10/23/2015  . Chest pain 12/14/2014  . CKD (chronic kidney disease), stage II 07/25/2014  . Osteoarthritis, knee 05/13/2014  . Anxiety state 04/21/2014  . Chronic diastolic CHF (congestive heart failure) ([place]) 03/13/2014  . SOB (shortness of breath) 03/05/2014  . CAD- RCA PCI '90s, STEMI-RCA DES 02/18/14 03/05/2014  . Back pain, lumbosacral 10/10/2012  . IDDM type 2 03/26/2007  . Hyperlipidemia associated with type 2 diabetes mellitus ([place]) 03/26/2007  . Hypertension associated with diabetes ([place]) 04/21/2006  . COPD (chronic obstructive pulmonary disease) ([place]) 04/21/2006   Past Surgical History:  Procedure Laterality Date  . CATARACT EXTRACTION  2020   right and  left eye   . CHOLECYSTECTOMY    . CORONARY ANGIOPLASTY WITH STENT PLACEMENT  02/18/14   Promus DES to RCA  . LEFT HEART CATHETERIZATION WITH CORONARY ANGIOGRAM N/A 02/18/2014   Procedure: LEFT HEART CATHETERIZATION WITH CORONARY ANGIOGRAM;  Surgeon: Jettie Booze, MD;  [facility] C[facility] LAB;  Service: Cardiovascular;  Laterality: N/A;  . PTCA  1990   PTCA of RCA  . TRANSTHORACIC ECHOCARDIOGRAM  02/02/2012   mild LVH, EF 55-60%, Normal WM, Gr 1 DD; Mild MR    Family History  Problem Relation Age of Onset  . Heart attack Mother 60  . Cancer Father 11  . Cancer Maternal Grandfather   . Cancer Paternal Grandmother   . Cancer Paternal Grandfather   . Diabetes Son   . Liver cancer Brother   . Bone cancer Brother   . Heart attack Son   . Hypertension Son   . Diabetes Son   . Hyperlipidemia Son     Medications- reviewed and updated Current Outpatient Medications  Medication Sig Dispense Refill  . ACCU-CHEK FASTCLIX LANCETS MISC     . acetaminophen (TYLENOL) 325 MG tablet Take 650 mg by mouth every 6 (six) hours as needed for mild pain. Reported on 10/23/2015    . albuterol (PROVENTIL) (2.5 MG/3ML) 0.083% nebulizer solution USE THREE MILLILITERS VIA NEBULIZATION BY MOUTH EVERY 6 HOURS AS NEEDED FOR WHEEZING OR SHORTNESS OF BREATH 75 mL 3  . albuterol (VENTOLIN HFA) 108 (90 Base) MCG/ACT inhaler Inhale 1 puff into the lungs every 4 (  four) hours as needed for wheezing or shortness of breath (RESCUE inhaler if advair not working). 18 g 5  . Alcohol Swabs (B-D SINGLE USE SWABS REGULAR) PADS USE  FOR  TESTING THREE TIMES DAILY 300 each 1  . amLODipine (NORVASC) 10 MG tablet TAKE 1 TABLET EVERY DAY 90 tablet 3  . aspirin 81 MG tablet Take 81 mg by mouth daily.     . Blood Glucose Calibration (ACCU-CHEK SMARTVIEW CONTROL) LIQD     . blood glucose meter kit and supplies KIT Dispense based on patient and insurance preference. Use up to four times daily as directed. (FOR ICD-9 250.00, 250.01). 1  each 0  . cholecalciferol (VITAMIN D3) 25 MCG (1000 UNIT) tablet Take 1,000 Units by mouth daily.    . clopidogrel (PLAVIX) 75 MG tablet TAKE ONE TABLET BY MOUTH DAILY **PLEASE KEEP UPCOMING APPOINTMENT IN NOVEMBER WITH DOCTOR [facility] FOR FUTURE REFILLS 90 tablet 1  . enalapril (VASOTEC) 20 MG tablet TAKE 1 TABLET TWICE DAILY 180 tablet 3  . fenofibrate micronized (LOFIBRA) 134 MG capsule TAKE ONE CAPSULE BY MOUTH DAILY 90 capsule 3  . ferrous sulfate 325 (65 FE) MG tablet Twice a week 18 tablet 3  . fish oil-omega-3 fatty acids 1000 MG capsule Take 2 g by mouth daily.      . Fluticasone-Salmeterol (ADVAIR) 250-50 MCG/DOSE AEPB INHALE ONE PUFF BY MOUTH TWICE DAILY 60 each 5  . folic acid (FOLVITE) 614 MCG tablet Take 400 mcg by mouth daily.      . furosemide (LASIX) 40 MG tablet Take 1 tablet (40 mg total) by mouth daily. 14 tablet 0  . glucose blood (ACCU-CHEK SMARTVIEW) test strip TEST BLOOD SUGAR THREE TIMES DAILY 300 strip 1  . insulin glargine (LANTUS SOLOSTAR) 100 UNIT/ML Solostar Pen INJECT  17  TO  20 UNITS SUBCUTANEOUSLY EVERY MORNING 90 mL 3  . Insulin Pen Needle (B-D ULTRAFINE III SHORT PEN) 31G X 8 MM MISC USE AS DIRECTED WITH LANTUS SOLOSTAR dx. e11.9 100 each 3  . KLOR-CON M20 20 MEQ tablet TAKE ONE TABLET BY MOUTH DAILY 90 tablet 3  . Melatonin 5 MG CAPS Take 1 capsule by mouth at bedtime.     . metFORMIN (GLUCOPHAGE) 1000 MG tablet TAKE 1 TABLET TWICE DAILY 180 tablet 1  . metoprolol tartrate (LOPRESSOR) 25 MG tablet TAKE 1 TABLET TWICE DAILY 180 tablet 3  . nitroGLYCERIN (NITROSTAT) 0.4 MG SL tablet PLACE 1 TABLET (0.4 MG TOTAL) UNDER THE TONGUE EVERY 5 (FIVE) MINUTESAS NEEDED FOR CHEST PAIN (CHEST PAIN OR SHORTNESS OF BREATH). (Patient not taking: Reported on 05/25/2020) 25 tablet 6  . pantoprazole (PROTONIX) 40 MG tablet Take 1 tablet (40 mg total) by mouth daily. 90 tablet 1  . rosuvastatin (CRESTOR) 40 MG tablet TAKE 1 TABLET EVERY DAY 90 tablet 2  . sertraline (ZOLOFT) 50 MG  tablet Take 1 tablet (50 mg total) by mouth at bedtime. 90 tablet 3  . SPIRIVA HANDIHALER 18 MCG inhalation capsule PLACE 1 CAPSULE INTO HANDIHALER AND INHALE CONTENTS OF 1 CAPSULE EVERY DAY AS DIRECTED AT 2 PM 90 capsule 1  . traMADol (ULTRAM) 50 MG tablet TAKE ONE TABLET BY MOUTH THREE TIMES A DAY AS NEEDED 90 tablet 5  . vitamin E 400 UNIT capsule Take 400 Units by mouth daily.       No current facility-administered medications for this visit.    Allergies-reviewed and updated Allergies  Allergen Reactions  . Codeine Sulfate Nausea Only  . Morphine  Sulfate Nausea Only    Social History   Social History Narrative   Lives with her son but he works all day. Home for dinner.  Independent ADL. Needs assist on some IADL.    Objective  Objective:  There were no vitals taken for this visit. Gen: NAD, resting comfortably HEENT: Mucous membranes are moist. Oropharynx normal Neck: no thyromegaly CV: RRR no murmurs rubs or gallops Lungs: CTAB no crackles, wheeze, rhonchi Abdomen: soft/nontender/nondistended/normal bowel sounds. No rebound or guarding.  Ext: no edema Skin: warm, dry Neuro: grossly normal, moves all extremities, PERRLA***   Assessment and Plan   86 y.o. female presenting for annual physical.  Health Maintenance counseling: 1. Anticipatory guidance: Patient counseled regarding regular dental exams ***q6 months, eye exams ***,  avoiding smoking and second hand smoke*** , limiting alcohol to 1 beverage per day*** .   2. Risk factor reduction:  Advised patient of need for regular exercise and diet rich and fruits and vegetables to reduce risk of heart attack and stroke. Exercise- ***. Diet-***.  Wt Readings from Last 3 Encounters:  02/18/20 183 lb (83 kg)  11/21/19 183 lb (83 kg)  07/23/19 183 lb (83 kg)   3. Immunizations/screenings/ancillary studies Immunization History  Administered Date(s) Administered  . Influenza Split 04/21/2011, 05/16/2012  . Influenza Whole  05/24/2007, 05/01/2009, 04/17/2010  . Influenza, High Dose Seasonal PF 03/23/2018, 05/25/2019  . Influenza,inj,Quad PF,6+ Mos 04/14/2013, 04/10/2014, 04/03/2015  . Influenza-Unspecified 06/01/2016, 06/15/2017, 05/24/2020  . Moderna SARS-COVID-2 Vaccination 10/25/2019, 11/22/2019  . Pneumococcal Conjugate-13 09/22/2014, 05/25/2019  . Pneumococcal Polysaccharide-23 04/18/2003  . Tdap 07/29/2011  . Zoster 04/18/2011   There are no preventive care reminders to display for this patient. 4. Cervical cancer screening- *** 5. Breast cancer screening-  breast exam *** and mammogram *** 6. Colon cancer screening - *** 7. Skin cancer screening- ***advised regular sunscreen use. Denies worrisome, changing, or new skin lesions.  8. Birth control/STD check- *** 9. Osteoporosis screening at 60- *** -*** smoker  Status of chronic or acute concerns  # Anxiety S:Medication: ***  Counseling: *** GAD 7 : Generalized Anxiety Score 02/22/2019  Nervous, Anxious, on Edge 0  Control/stop worrying 0  Worry too much - different things 0  Trouble relaxing 0  Restless 0  Easily annoyed or irritable 0  Afraid - awful might happen 0  Total GAD 7 Score 0  Anxiety Difficulty Not difficult at all   A/P: ***   #hyperlipidemia S: Medication: Rosuvastatin 40Mg, fish oil 1000Mg Lab Results  Component Value Date   CHOL 128 02/18/2020   HDL 49 (L) 02/18/2020   LDLCALC 42 02/18/2020   LDLDIRECT 40.0 07/23/2019   TRIG 355 (H) 02/18/2020   CHOLHDL 2.6 02/18/2020   A/P: ***  #hypertension S: medication: Amlodipine 10Mg, enalapril 20Mg Home readings #s: *** BP Readings from Last 3 Encounters:  02/18/20 130/70  11/21/19 140/62  07/23/19 130/78  A/P: ***   ***Medicare AWVS: 05/20/2019 *** cpe 02/18/20 *** No diagnosis found.  Recommended follow up: ***No follow-ups on file. Future Appointments  Date Time Provider [place]  06/25/2020  1:20 PM [name], MD [facility]  06/07/2021  2:30  PM [facility] HEALTH COACH [facility]    No chief complaint on file.  Lab/Order associations:*** fasting No diagnosis found.  No orders of the defined types were placed in this encounter.   Return precautions advised.  Clyde Lundborg, CMA

## 2020-06-24 NOTE — Telephone Encounter (Signed)
Patient son called in and stated that his mother memory has deteriorate in the last few days. Shanon Brow stated that he calls his mother every night and last night she didn't even know who he was. Shanon Brow also stated that his brother lives with the patient and she didn't even know who he was she thought maybe he was her brother. Shanon Brow is very worried she is scheduled for tomorrow but Shanon Brow would like a call back to just to get give some information before her appt so she doesn't get upset.

## 2020-06-24 NOTE — ED Notes (Signed)
Pt to CT

## 2020-06-24 NOTE — Patient Instructions (Incomplete)
Depression screen [facility] 2/9 05/25/2020 02/18/2020 11/21/2019  Decreased Interest 0 0 0  Down, Depressed, Hopeless 0 0 0  PHQ - 2 Score 0 0 0  Altered sleeping - 0 3  Tired, decreased energy - 3 2  Change in appetite - 0 0  Feeling bad or failure about yourself  - 0 2  Trouble concentrating - 0 0  Moving slowly or fidgety/restless - 0 0  Suicidal thoughts - 0 0  PHQ-9 Score - 3 7  Difficult doing work/chores - Not difficult at all Somewhat difficult  Some recent data might be hidden

## 2020-06-24 NOTE — Telephone Encounter (Signed)
Noted! Thank you

## 2020-06-24 NOTE — ED Provider Notes (Signed)
[facility] EMERGENCY DEPARTMENT Provider Note   CSN: [id number] Arrival date & time: 06/24/20  1434     History Chief Complaint  Patient presents with  . Altered Mental Status    Michelle [name] is a 86 y.o. female.  The history is provided by the patient, a relative and medical records. No language interpreter was used.  Altered Mental Status Presenting symptoms: confusion and disorientation   Severity:  Moderate Most recent episode:  More than 2 days ago Episode history:  Continuous Timing:  Constant Progression:  Waxing and waning Chronicity:  New Context: not dementia and not nursing home resident   Associated symptoms: hallucinations   Associated symptoms: no abdominal pain, no fever, no headaches, no light-headedness, no nausea, no palpitations, no seizures and no vomiting        Past Medical History:  Diagnosis Date  . Anginal pain ([place])   . Arthritis   . AV block, 1st degree   . CAD (coronary artery disease) 1990; 2015   Cardiac cath 1990 with Dr. Lia Foyer and [name] Residual CAD, non obstructive per 2015 cath. STEMI status post stent in August 2015.  Marland Kitchen COPD (chronic obstructive pulmonary disease) ([place])   . Diabetes mellitus type 2, insulin dependent ([place])   . Essential hypertension   . Hyperlipidemia with target LDL less than 70   . Myocardial infarction ([place])   . Pericarditis-post MI (short course of steroids) 03/06/2014  . 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 Shortness of breath     Patient Active Problem List   Diagnosis Date Noted  . Actinic keratosis 11/17/2017  . Anemia, iron deficiency 10/23/2015  . Chest pain 12/14/2014  . CKD (chronic kidney disease), stage II 07/25/2014  . Osteoarthritis, knee  05/13/2014  . Anxiety state 04/21/2014  . Chronic diastolic CHF (congestive heart failure) ([place]) 03/13/2014  . SOB (shortness of breath) 03/05/2014  . CAD- RCA PCI '90s, STEMI-RCA DES 02/18/14 03/05/2014  . Back pain, lumbosacral 10/10/2012  . IDDM type 2 03/26/2007  . Hyperlipidemia associated with type 2 diabetes mellitus ([place]) 03/26/2007  . Hypertension associated with diabetes ([place]) 04/21/2006  . COPD (chronic obstructive pulmonary disease) ([place]) 04/21/2006    Past Surgical History:  Procedure Laterality Date  . CATARACT EXTRACTION  2020   right and left eye   . CHOLECYSTECTOMY    . CORONARY ANGIOPLASTY WITH STENT PLACEMENT  02/18/14   Promus DES to RCA  . LEFT HEART CATHETERIZATION WITH CORONARY ANGIOGRAM N/A 02/18/2014   Procedure: LEFT HEART CATHETERIZATION WITH CORONARY ANGIOGRAM;  Surgeon: Jettie Booze, MD;  Location: [facility] CATH LAB;  Service: Cardiovascular;  Laterality: N/A;  . PTCA  1990   PTCA of RCA  . TRANSTHORACIC ECHOCARDIOGRAM  02/02/2012   mild LVH, EF 55-60%, Normal WM, Gr 1 DD; Mild MR     OB History   No obstetric history on file.     Family History  Problem Relation Age of Onset  . Heart attack Mother 46  . Cancer Father 58  . Cancer Maternal Grandfather   . Cancer Paternal Grandmother   . Cancer Paternal Grandfather   . Diabetes Son   . Liver cancer Brother   . Bone cancer Brother   .  Heart attack Son   . Hypertension Son   . Diabetes Son   . Hyperlipidemia Son     Social History   Tobacco Use  . Smoking status: Former Smoker    Packs/day: 1.00    Years: 55.00    Pack years: 55.00    Types: Cigarettes    Quit date: 09/29/2006    Years since quitting: 13.7  . Smokeless tobacco: Never Used  Vaping Use  . Vaping Use: Never used  Substance Use Topics  . Alcohol use: No  . Drug use: No    Home Medications Prior to Admission medications   Medication Sig Start Date End Date Taking? Authorizing Provider  ACCU-CHEK FASTCLIX LANCETS [place]   12/14/16   [provider]  acetaminophen (TYLENOL) 325 MG tablet Take 650 mg by mouth every 6 (six) hours as needed for mild pain. Reported on 10/23/2015    [provider]  albuterol (PROVENTIL) (2.5 MG/3ML) 0.083% nebulizer solution USE THREE MILLILITERS VIA NEBULIZATION BY MOUTH EVERY 6 HOURS AS NEEDED FOR WHEEZING OR SHORTNESS OF BREATH 01/31/20   [name], MD  albuterol (VENTOLIN HFA) 108 (90 Base) MCG/ACT inhaler Inhale 1 puff into the lungs every 4 (four) hours as needed for wheezing or shortness of breath (RESCUE inhaler if advair not working). 05/11/20   [name], MD  Alcohol Swabs (B-D SINGLE USE SWABS REGULAR) PADS USE  FOR  TESTING THREE TIMES DAILY 04/09/20   [name], MD  amLODipine (NORVASC) 10 MG tablet TAKE 1 TABLET EVERY DAY 05/29/20   [name], MD  aspirin 81 MG tablet Take 81 mg by mouth daily.  07/02/14   [provider]  Blood Glucose Calibration (ACCU-CHEK SMARTVIEW CONTROL) LIQD  01/11/17   [provider]  blood glucose meter kit and supplies KIT Dispense based on patient and insurance preference. Use up to four times daily as directed. (FOR ICD-9 250.00, 250.01). 05/28/20   [name], MD  cholecalciferol (VITAMIN D3) 25 MCG (1000 UNIT) tablet Take 1,000 Units by mouth daily.    [provider]  clopidogrel (PLAVIX) 75 MG tablet TAKE ONE TABLET BY MOUTH DAILY **PLEASE KEEP UPCOMING APPOINTMENT IN NOVEMBER WITH DOCTOR [facility] FOR FUTURE REFILLS 02/06/20   [name], MD  enalapril (VASOTEC) 20 MG tablet TAKE 1 TABLET TWICE DAILY 01/21/20   [name], MD  fenofibrate micronized (LOFIBRA) 134 MG capsule TAKE ONE CAPSULE BY MOUTH DAILY 10/09/19   [name], MD  ferrous sulfate 325 (65 FE) MG tablet Twice a week 02/21/20   [name], MD  fish oil-omega-3 fatty acids 1000 MG capsule Take 2 g by mouth daily.      [provider]  Fluticasone-Salmeterol (ADVAIR) 250-50  MCG/DOSE AEPB INHALE ONE PUFF BY MOUTH TWICE DAILY 05/18/20   [name], MD  folic acid (FOLVITE) 154 MCG tablet Take 400 mcg by mouth daily.      [provider]  furosemide (LASIX) 40 MG tablet Take 1 tablet (40 mg total) by mouth daily. 04/07/20   [name], MD  glucose blood (ACCU-CHEK SMARTVIEW) test strip TEST BLOOD SUGAR THREE TIMES DAILY 05/28/20   [name], MD  insulin glargine (LANTUS SOLOSTAR) 100 UNIT/ML Solostar Pen INJECT  17  TO  20 UNITS SUBCUTANEOUSLY EVERY MORNING 09/23/19   [name], MD  Insulin Pen Needle (B-D ULTRAFINE III SHORT PEN) 31G X 8 MM MISC USE AS DIRECTED WITH  LANTUS SOLOSTAR dx. e11.9 05/28/20   [name], MD  KLOR-CON M20 20 MEQ tablet TAKE ONE TABLET BY MOUTH DAILY 09/09/19   Burnell Blanks, MD  Melatonin 5 MG CAPS Take 1 capsule by mouth at bedtime.     [provider]  metFORMIN (GLUCOPHAGE) 1000 MG tablet TAKE 1 TABLET TWICE DAILY 05/04/20   [name], MD  metoprolol tartrate (LOPRESSOR) 25 MG tablet TAKE 1 TABLET TWICE DAILY 04/17/20   [name], MD  nitroGLYCERIN (NITROSTAT) 0.4 MG SL tablet PLACE 1 TABLET (0.4 MG TOTAL) UNDER THE TONGUE EVERY 5 (FIVE) MINUTESAS NEEDED FOR CHEST PAIN (CHEST PAIN OR SHORTNESS OF BREATH). Patient not taking: Reported on 05/25/2020 07/29/19   Burnell Blanks, MD  pantoprazole (PROTONIX) 40 MG tablet Take 1 tablet (40 mg total) by mouth daily. 02/06/20   [name], MD  rosuvastatin (CRESTOR) 40 MG tablet TAKE 1 TABLET EVERY DAY 03/20/20   [name], MD  sertraline (ZOLOFT) 50 MG tablet Take 1 tablet (50 mg total) by mouth at bedtime. 11/21/19   [name], MD  SPIRIVA HANDIHALER 18 MCG inhalation capsule PLACE 1 CAPSULE INTO HANDIHALER AND INHALE CONTENTS OF 1 CAPSULE EVERY DAY AS DIRECTED AT 2 PM 04/09/20   [name], MD  traMADol (ULTRAM) 50 MG tablet TAKE ONE TABLET BY MOUTH THREE TIMES A DAY AS NEEDED 02/18/20   [name], MD  vitamin E 400 UNIT capsule Take 400 Units by mouth daily.      [provider]    Allergies    Codeine sulfate and Morphine sulfate  Review of Systems   Review of Systems  Constitutional: Positive for fatigue. Negative for chills, diaphoresis and fever.  HENT: Negative for congestion.   Eyes: Negative for visual disturbance.  Respiratory: Negative for cough, chest tightness, shortness of breath and wheezing.   Cardiovascular: Negative for chest pain and palpitations.  Gastrointestinal: Negative for abdominal distention, abdominal pain, constipation, diarrhea, nausea and vomiting.  Genitourinary: Negative for dysuria, flank pain and frequency.  Musculoskeletal: Negative for back pain, neck pain and neck stiffness.  Skin: Negative for wound.  Neurological: Negative for dizziness, seizures, facial asymmetry, light-headedness and headaches.  Psychiatric/Behavioral: Positive for confusion and hallucinations.  All other systems reviewed and are negative.   Physical Exam Updated Vital Signs BP (!) 169/79   Pulse 83   Temp 98.4 F (36.9 C) (Oral)   Resp 13   SpO2 99%   Physical Exam Vitals and nursing note reviewed.  Constitutional:      General: She is not in acute distress.    Appearance: She is well-developed. She is not ill-appearing, toxic-appearing or diaphoretic.  HENT:     Head: Normocephalic and atraumatic.     Right Ear: External ear normal.     Left Ear: External ear normal.     Nose: Nose normal.     Mouth/Throat:     Pharynx: No oropharyngeal exudate.  Eyes:     Conjunctiva/sclera: Conjunctivae normal.     Pupils: Pupils are equal, round, and reactive to light.  Pulmonary:     Effort: No respiratory distress.     Breath sounds: No stridor.  Abdominal:     General: There is no distension.     Tenderness: There is no abdominal tenderness. There is no rebound.  Musculoskeletal:        General: No tenderness.     Cervical back: Normal  range of  motion and neck supple.     Right lower leg: No edema.     Left lower leg: No edema.  Skin:    General: Skin is warm.     Coloration: Skin is pale.     Findings: No erythema or rash.  Neurological:     General: No focal deficit present.     Mental Status: She is alert.     GCS: GCS eye subscore is 4. GCS verbal subscore is 5. GCS motor subscore is 6.     Cranial Nerves: No cranial nerve deficit or facial asymmetry.     Sensory: No sensory deficit.     Motor: No weakness, tremor, abnormal muscle tone or seizure activity.     Coordination: Coordination normal. Finger-Nose-Finger Test normal.     Deep Tendon Reflexes: Reflexes are normal and symmetric.     Comments: Intermittent disorientation not currently disoriented.  Psychiatric:     Comments: Not currently present but has had tactile hallucinations over the last few days.     ED Results / Procedures / Treatments   Labs (all labs ordered are listed, but only abnormal results are displayed) Labs Reviewed  COMPREHENSIVE METABOLIC PANEL - Abnormal; Notable for the following components:      Result Value   Potassium 3.4 (*)    Glucose, Bld 106 (*)    All other components within normal limits  URINALYSIS, ROUTINE W REFLEX MICROSCOPIC - Abnormal; Notable for the following components:   Hgb urine dipstick SMALL (*)    Protein, ur >=300 (*)    Bacteria, UA RARE (*)    All other components within normal limits  RESP PANEL BY RT-PCR (FLU A&B, COVID) [id number]  URINE CULTURE  CBC  LIPASE, BLOOD  LACTIC ACID, PLASMA  LACTIC ACID, PLASMA  TSH  AMMONIA  TROPONIN I (HIGH SENSITIVITY)  TROPONIN I (HIGH SENSITIVITY)    EKG EKG Interpretation  Date/Time:  Wednesday June 24 2020 23:19:20 EST Ventricular Rate:  83 PR Interval:    QRS Duration: 106 QT Interval:  381 QTC Calculation: 448 R Axis:   -42 Text Interpretation: Sinus rhythm Prolonged PR interval Left anterior fascicular block Abnormal R-wave progression, late  transition when compared to prior, similar apperance. No STEMI Confirmed by Antony Blackbird [phone]) on 06/24/2020 11:21:28 PM   Radiology DG Chest 2 View  Result Date: 06/24/2020 CLINICAL DATA:  Chest pain altered EXAM: CHEST - 2 VIEW COMPARISON:  12/14/2014 FINDINGS: No focal opacity or pleural effusion. Normal cardiomediastinal silhouette with aortic atherosclerosis. No pneumothorax. IMPRESSION: No active cardiopulmonary disease. Electronically Signed   By: Donavan Foil M.D.   On: 06/24/2020 23:44   CT Head Wo Contrast  Result Date: 06/24/2020 CLINICAL DATA:  Altered mental status. EXAM: CT HEAD WITHOUT CONTRAST TECHNIQUE: Contiguous axial images were obtained from the base of the skull through the vertex without intravenous contrast. COMPARISON:  None. FINDINGS: Brain: There is mild to moderate severity cerebral atrophy with widening of the extra-axial spaces and ventricular dilatation. There are areas of decreased attenuation within the white matter tracts of the supratentorial brain, consistent with microvascular disease changes. Vascular: No hyperdense vessel or unexpected calcification. Skull: Normal. Negative for fracture or focal lesion. Sinuses/Orbits: No acute finding. Other: None. IMPRESSION: 1. Generalized cerebral atrophy. 2. No acute intracranial abnormality. Electronically Signed   By: Virgina Norfolk M.D.   On: 06/24/2020 22:45    Procedures Procedures (including critical care time)  Medications Ordered in ED Medications  labetalol (NORMODYNE) injection 5  mg (has no administration in time range)    ED Course  I have reviewed the triage vital signs and the nursing notes.  Pertinent labs & imaging results that were available during my care of the patient were reviewed by me and considered in my medical decision making (see chart for details).    MDM Rules/Calculators/A&P                          Michelle [name] is a 86 y.o. female with a past medical history significant  for CAD status post PCI, CHF, CKD, hypertension, hyperlipidemia, diabetes, arthritis, and prior cholecystectomy who presents for several concerns including altered mental status with confusion, hallucinations, disorientation, elevated blood pressures, and chest discomfort.  Patient's accompanied by her son.  They report that normally, patient is "sharp" and does not have baseline dementia or any neurologic deficits.  She normally is not confused.  They report that several days ago, patient was starting to confused and has been waxing waning since.  They report that she was trying to see and talk to her deceased mother and also did not know who her son was.  She says that she has been having tactile hallucinations crawling on her body which were not there.  They report she has been disoriented and this is not normally her.  Patient says that while in the emergency department at night, she did have some sharp left-sided chest discomfort that was brief and moderate.  She reports it has resolved.  She denies recent fevers, chills, congestion, cough, nausea, vomiting, constipation, or diarrhea.  She denies any urinary changes.  She denies any trauma.  She denies any headache, speech difficulties, or vision changes.  Denies numbness, tingling, weakness.  Family is primarily concerned about her several days of the disorientation, confusion, hallucinations, and not acting like herself.  On exam, lungs are clear and chest is nontender.  Abdomen is nontender.  She is moving all extremities with normal sensation and strength in extremities.  Good finger-nose-finger testing.  Pupils are symmetric reactive normal extraocular movements.  Symmetric smile.  Clear speech.  No focal neurologic deficits initially.  Patient does agree that she is feeling confused at times and is disoriented.  She does agree that she did not know who her sons were and was confused about her deceased parents.  Clinically I am concerned about delirium  with an unclear etiology.  Patient's blood pressures were very elevated in the 180s and 190s on arrival.  The family does have a notebook showing that the patient has had elevated blood pressures recently in the last few weeks that have been elevated like this despite taking her blood pressure medicine.  Potentially, patient could have a hypertensive emergency causing altered mental status such as PRES however she is not having any headache.  She had a head CT in triage which showed cerebral atrophy but no acute abnormality seen.  Her CBC and CMP were reassuring aside from very mild hypokalemia.  Urinalysis shows some rare bacteria but otherwise no nitrites or leukocytes.  Patient will get other screening labs to look for etiologies of her altered mental status which I am concerned is acute delirium.  We will get labs including ammonia, TSH, a Covid swab given the ongoing pandemic, and with her chest discomfort on the left side we will get a lipase and chest x-ray.  We will get troponin given her history of CAD and the chest pain  tonight.  The pain did not radiate her back and although she has had elevated blood pressures, I have a low suspicion for dissection at this time.  After work-up is completed, anticipate a shared decision-making conversation to discuss management given several days of acute delirium in this 86 year old patient and her still not being at her reported baseline.  Care transferred oncoming team while waiting for her work-up to be completed prior to possible admission discussion for delirium.   Final Clinical Impression(s) / ED Diagnoses Final diagnoses:  Delirium  Tactile hallucinations  Chest pain, unspecified type  Confusion  Elevated blood pressure reading    Clinical Impression: 1. Delirium   2. Tactile hallucinations   3. Chest pain, unspecified type   4. Confusion   5. Elevated blood pressure reading      Disposition: Care transferred oncoming team while waiting  for her work-up to be completed prior to possible admission discussion for delirium.   This note was prepared with assistance of Systems analyst. Occasional wrong-word or sound-a-like substitutions may have occurred due to the inherent limitations of voice recognition software.      Eulan Heyward, Gwenyth Allegra, MD 06/24/20 [phone]

## 2020-06-24 NOTE — Telephone Encounter (Signed)
Son called back to follow up on this. I have the pt getting triaged by the nurse line currently.

## 2020-06-25 ENCOUNTER — Encounter: Payer: Medicare HMO | Admitting: Family Medicine

## 2020-06-25 ENCOUNTER — Observation Stay (HOSPITAL_COMMUNITY): Payer: Medicare HMO

## 2020-06-25 DIAGNOSIS — E1169 Type 2 diabetes mellitus with other specified complication: Secondary | ICD-10-CM | POA: Diagnosis not present

## 2020-06-25 DIAGNOSIS — E119 Type 2 diabetes mellitus without complications: Secondary | ICD-10-CM

## 2020-06-25 DIAGNOSIS — I6782 Cerebral ischemia: Secondary | ICD-10-CM | POA: Diagnosis not present

## 2020-06-25 DIAGNOSIS — R030 Elevated blood-pressure reading, without diagnosis of hypertension: Secondary | ICD-10-CM

## 2020-06-25 DIAGNOSIS — I152 Hypertension secondary to endocrine disorders: Secondary | ICD-10-CM | POA: Diagnosis not present

## 2020-06-25 DIAGNOSIS — E1159 Type 2 diabetes mellitus with other circulatory complications: Secondary | ICD-10-CM

## 2020-06-25 DIAGNOSIS — I639 Cerebral infarction, unspecified: Secondary | ICD-10-CM | POA: Diagnosis not present

## 2020-06-25 DIAGNOSIS — J449 Chronic obstructive pulmonary disease, unspecified: Secondary | ICD-10-CM

## 2020-06-25 DIAGNOSIS — Z794 Long term (current) use of insulin: Secondary | ICD-10-CM | POA: Diagnosis not present

## 2020-06-25 DIAGNOSIS — E785 Hyperlipidemia, unspecified: Secondary | ICD-10-CM | POA: Diagnosis not present

## 2020-06-25 DIAGNOSIS — R410 Disorientation, unspecified: Secondary | ICD-10-CM | POA: Diagnosis not present

## 2020-06-25 DIAGNOSIS — G9389 Other specified disorders of brain: Secondary | ICD-10-CM | POA: Diagnosis not present

## 2020-06-25 LAB — CBC
HCT: 42.4 % (ref 36.0–46.0)
Hemoglobin: 14.6 g/dL (ref 12.0–15.0)
MCH: 29.7 pg (ref 26.0–34.0)
MCHC: 34.4 g/dL (ref 30.0–36.0)
MCV: 86.4 fL (ref 80.0–100.0)
Platelets: 251 10*3/uL (ref 150–400)
RBC: 4.91 MIL/uL (ref 3.87–5.11)
RDW: 12.8 % (ref 11.5–15.5)
WBC: 10.5 10*3/uL (ref 4.0–10.5)
nRBC: 0 % (ref 0.0–0.2)

## 2020-06-25 LAB — COMPREHENSIVE METABOLIC PANEL
ALT: 16 U/L (ref 0–44)
AST: 21 U/L (ref 15–41)
Albumin: 3.7 g/dL (ref 3.5–5.0)
Alkaline Phosphatase: 45 U/L (ref 38–126)
Anion gap: 14 (ref 5–15)
BUN: 17 mg/dL (ref 8–23)
CO2: 22 mmol/L (ref 22–32)
Calcium: 9.4 mg/dL (ref 8.9–10.3)
Chloride: 101 mmol/L (ref 98–111)
Creatinine, Ser: 0.77 mg/dL (ref 0.44–1.00)
GFR, Estimated: >60 mL/min (ref >60)
Glucose, Bld: 150 mg/dL — ABNORMAL HIGH (ref 70–99)
Potassium: 3.2 mmol/L — ABNORMAL LOW (ref 3.5–5.1)
Sodium: 137 mmol/L (ref 135–145)
Total Bilirubin: 0.6 mg/dL (ref 0.3–1.2)
Total Protein: 6.9 g/dL (ref 6.5–8.1)

## 2020-06-25 LAB — TSH: TSH: 1.68 u[IU]/mL (ref 0.350–4.500)

## 2020-06-25 LAB — RESP PANEL BY RT-PCR (FLU A&B, COVID) ARPGX2
Influenza A by PCR: NEGATIVE
Influenza B by PCR: NEGATIVE
SARS Coronavirus 2 by RT PCR: NEGATIVE

## 2020-06-25 LAB — AMMONIA: Ammonia: 43 umol/L — ABNORMAL HIGH (ref 9–35)

## 2020-06-25 LAB — TROPONIN I (HIGH SENSITIVITY)
Troponin I (High Sensitivity): 7 ng/L (ref <18)
Troponin I (High Sensitivity): 7 ng/L (ref <18)

## 2020-06-25 LAB — LACTIC ACID, PLASMA: Lactic Acid, Venous: 1.9 mmol/L (ref 0.5–1.9)

## 2020-06-25 LAB — LIPASE, BLOOD: Lipase: 31 U/L (ref 11–51)

## 2020-06-25 LAB — HEMOGLOBIN A1C
Hgb A1c MFr Bld: 6.6 % — ABNORMAL HIGH (ref 4.8–5.6)
Mean Plasma Glucose: 142.72 mg/dL

## 2020-06-25 LAB — CBG MONITORING, ED
Glucose-Capillary: 152 mg/dL — ABNORMAL HIGH (ref 70–99)
Glucose-Capillary: 134 mg/dL — ABNORMAL HIGH (ref 70–99)

## 2020-06-25 IMAGING — MR MR HEAD W/O CM
7 of 8 series · 40 of 48 positions shown · non-contrast
Comparison: Head CT from yesterday

CLINICAL DATA: Delirium

EXAM:
MRI HEAD WITHOUT CONTRAST
TECHNIQUE: Multiplanar, multiecho pulse sequences of the brain and surrounding
structures were obtained without intravenous contrast.

[Series 5: T2 · axial · 5.0mm · 0.72mm/px · z∈[-76,+67]mm · 3 of 25 slices shown (1 of 2)]
[im 1/25]
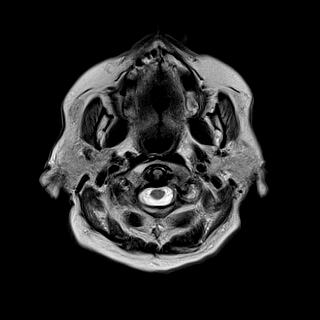
[im 13/25]
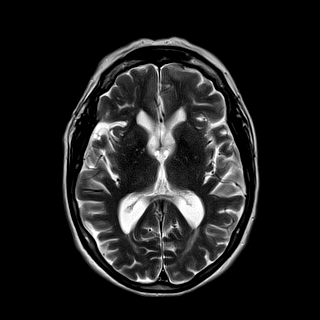
[im 25/25]
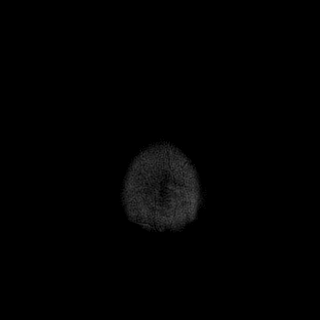

[Series 6: FLAIR · axial · 5.0mm · 0.45mm/px · z∈[-77,+66]mm · 3 of 25 slices shown]
[im 1/25]
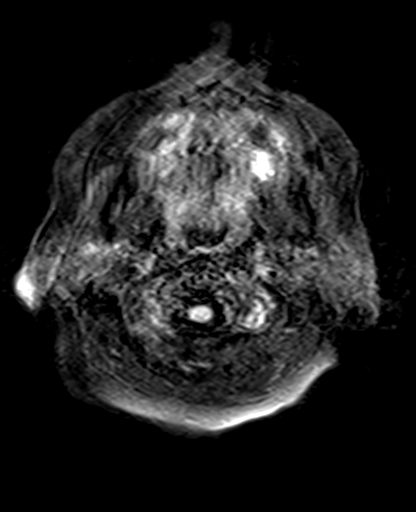
[im 13/25]
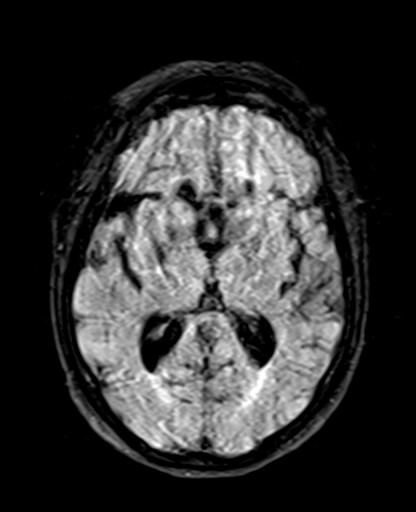
[im 25/25]
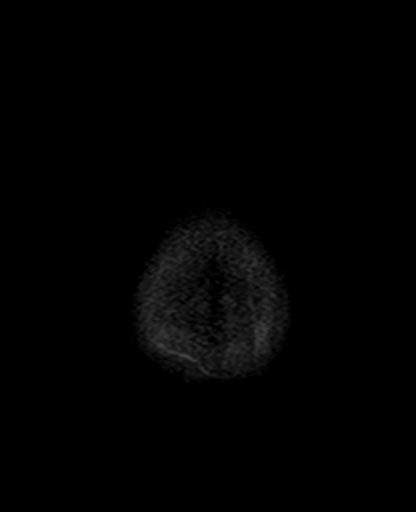

[Series 7: mag_images · axial · 3.0mm · 0.90mm/px · z∈[-94,+83]mm · 8 of 60 slices shown]
[im 1/60]
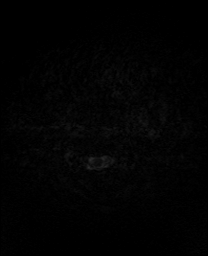
[im 9/60]
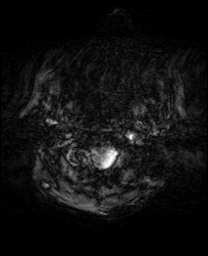
[im 17/60]
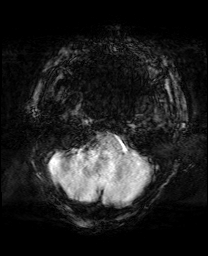
[im 26/60]
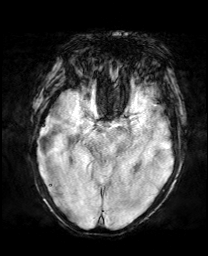
[im 34/60]
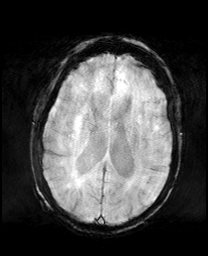
[im 43/60]
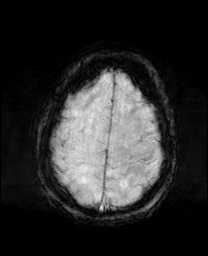
[im 51/60]
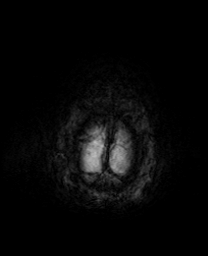
[im 60/60]
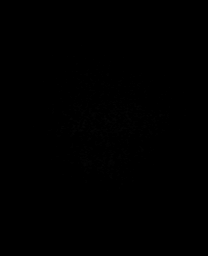

[Series 8: pha_images · axial · 3.0mm · 0.90mm/px · z∈[-82,+77]mm · 7 of 51 slices shown]
[im 1/51]
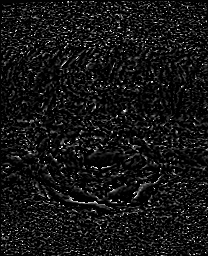
[im 9/51]
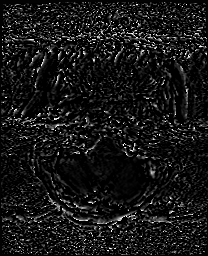
[im 17/51]
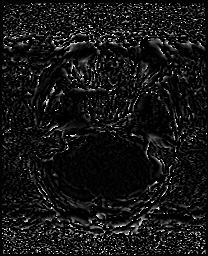
[im 26/51]
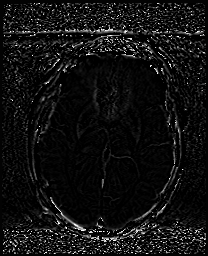
[im 34/51]
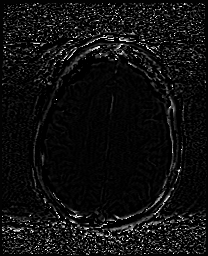
[im 42/51]
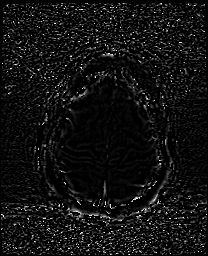
[im 51/51]
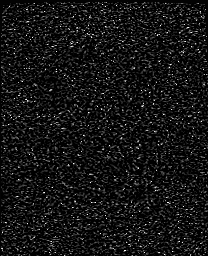

[Series 9: swi_images · axial · 3.0mm · 0.90mm/px · z∈[-94,+83]mm · 8 of 60 slices shown]
[im 1/60]
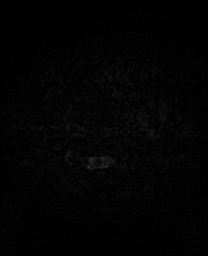
[im 9/60]
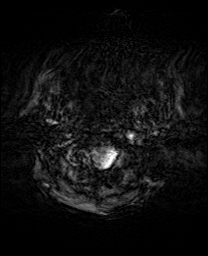
[im 17/60]
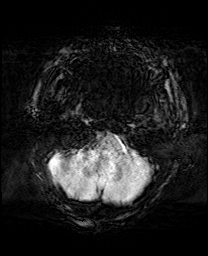
[im 26/60]
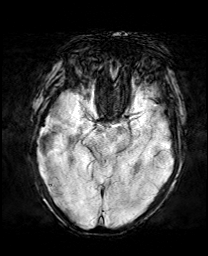
[im 34/60]
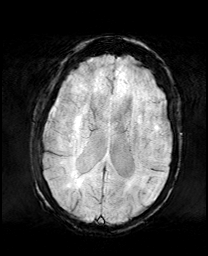
[im 43/60]
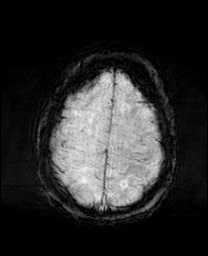
[im 51/60]
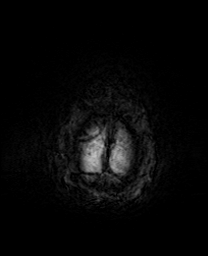
[im 60/60]
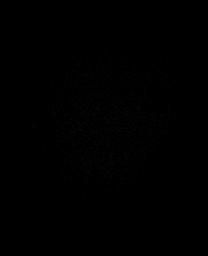

[Series 10: mip_images(sw) · axial · 24.0mm · 0.90mm/px · z∈[-83,+72]mm · 7 of 53 slices shown]
[im 1/53]
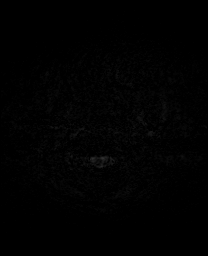
[im 9/53]
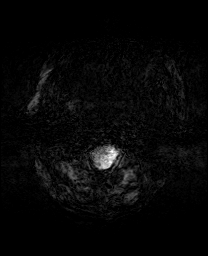
[im 18/53]
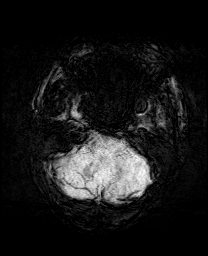
[im 27/53]
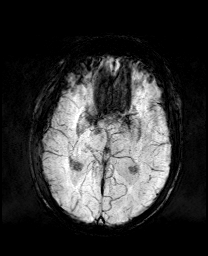
[im 35/53]
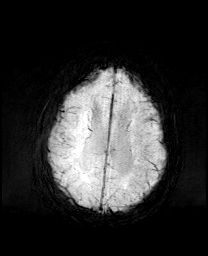
[im 44/53]
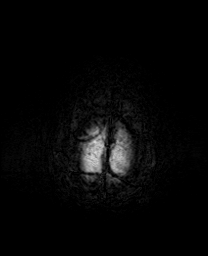
[im 53/53]
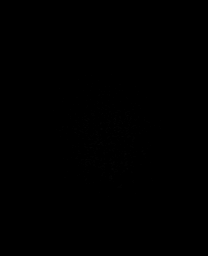

[Series 12: T2 · coronal · 5.0mm · 0.34mm/px · 4 of 29 slices shown (2 of 2)]
[im 1/29]
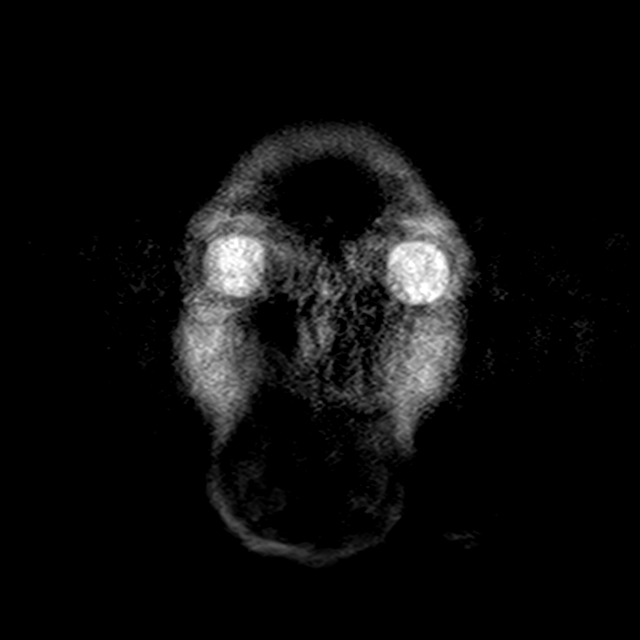
[im 10/29]
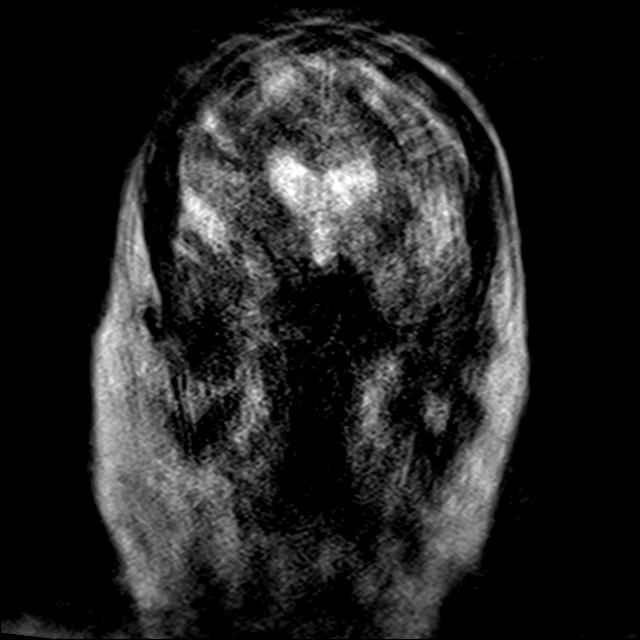
[im 19/29]
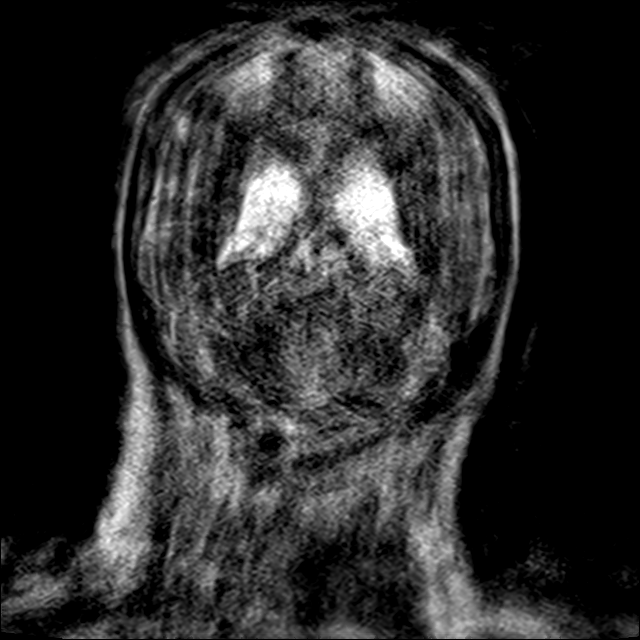
[im 29/29]
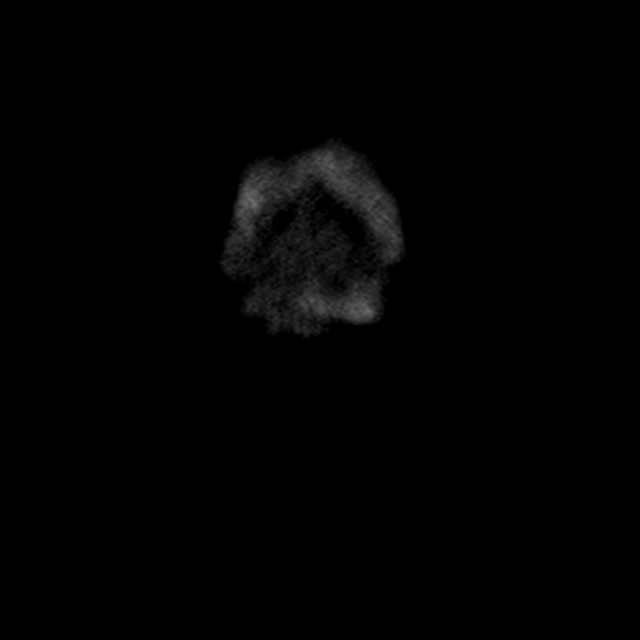

[40 of 48 positions shown; findings below may reference images not displayed]

FINDINGS: Brain: No acute infarction, hemorrhage, hydrocephalus, extra-axial
collection or mass lesion. Mild for age chronic small vessel
ischemia in the cerebral white matter. Cerebral volume loss in
keeping with age. Small remote left cerebellar infarction.

Vascular: Preserved flow voids

Skull and upper cervical spine: Partially covered hypointensity in
the C4 body, usually degenerative sclerosis. There is disc
narrowing, facet spurring, anterolisthesis at C3-4.

Sinuses/Orbits: Bilateral cataract resection

Other: Progressively motion degraded study, coronal T2 weighted
imaging was nondiagnostic.
IMPRESSION: 1. Senescent changes without acute intracranial finding.
2. Partially covered signal abnormality in the C4 body, likely
degenerative sclerosis. Consider confirmatory cervical spine CT
without contrast.
3. Motion degraded exam.

## 2020-06-25 IMAGING — MR MR HEAD W/O CM
5 series · 48 of 48 positions shown · non-contrast
Comparison: Head CT from yesterday

CLINICAL DATA: Delirium

EXAM:
MRI HEAD WITHOUT CONTRAST
TECHNIQUE: Multiplanar, multiecho pulse sequences of the brain and surrounding
structures were obtained without intravenous contrast.

[Series 5: DWI · axial · 3.0mm · 0.88mm/px · z∈[-74,+70]mm · 17 of 100 slices shown (1 of 4)]
[im 1/100]
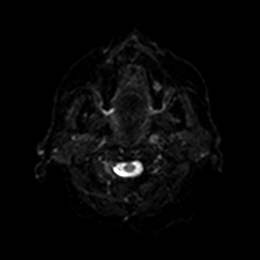
[im 7/100]
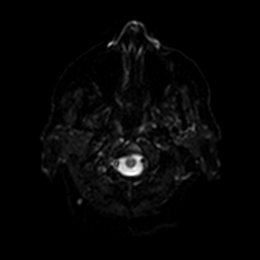
[im 13/100]
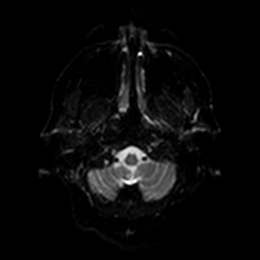
[im 19/100]
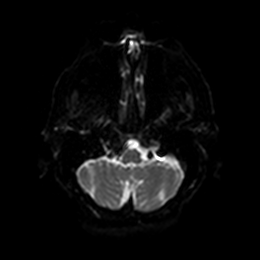
[im 25/100]
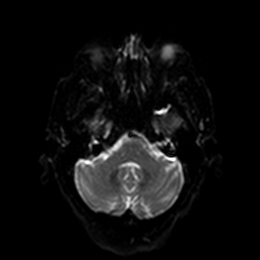
[im 31/100]
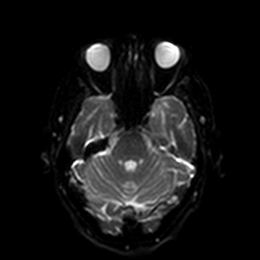
[im 38/100]
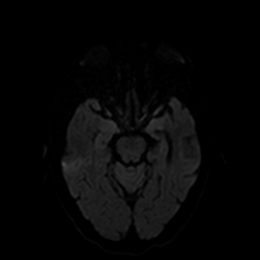
[im 44/100]
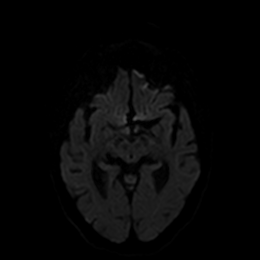
[im 50/100]
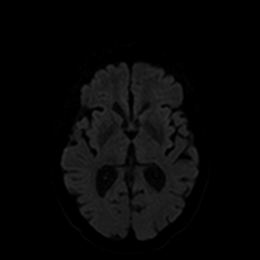
[im 56/100]
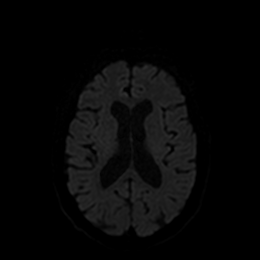
[im 62/100]
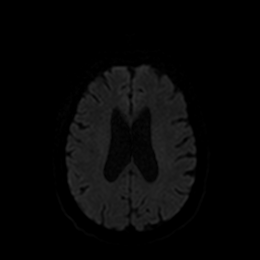
[im 69/100]
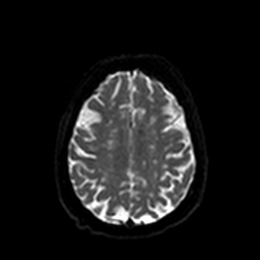
[im 75/100]
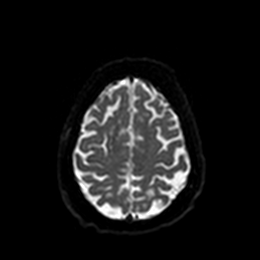
[im 81/100]
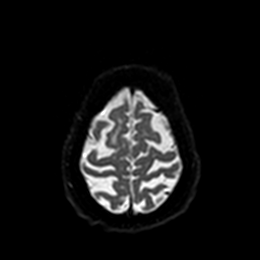
[im 87/100]
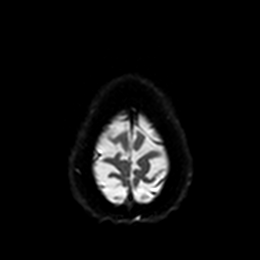
[im 93/100]
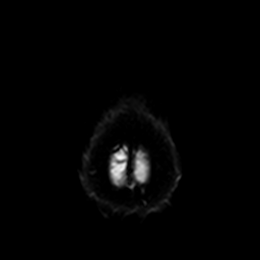
[im 100/100]
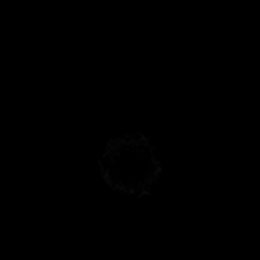

[Series 6: DWI · axial · 3.0mm · 0.88mm/px · z∈[-74,+70]mm · 9 of 50 slices shown (2 of 4)]
[im 1/50]
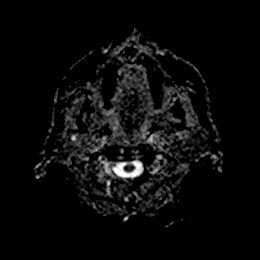
[im 7/50]
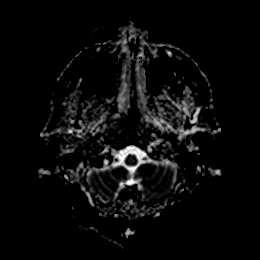
[im 13/50]
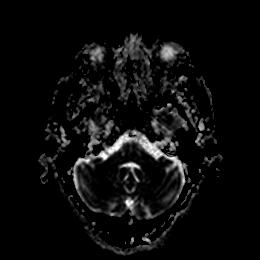
[im 19/50]
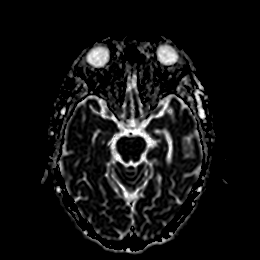
[im 25/50]
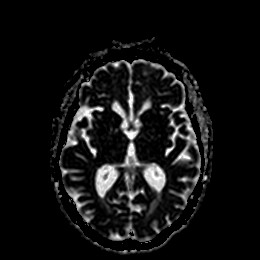
[im 31/50]
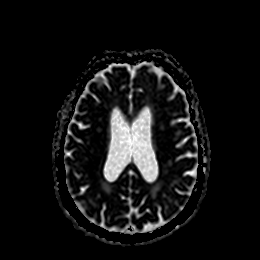
[im 37/50]
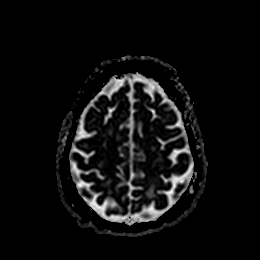
[im 43/50]
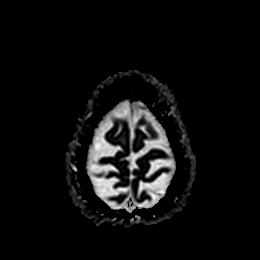
[im 50/50]
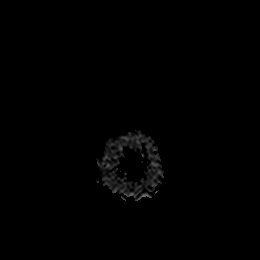

[Series 7: DWI · coronal · 4.0mm · 0.88mm/px · 12 of 68 slices shown (3 of 4)]
[im 1/68]
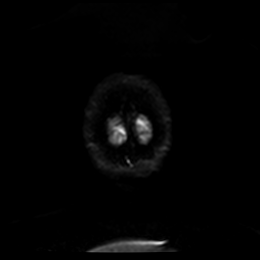
[im 7/68]
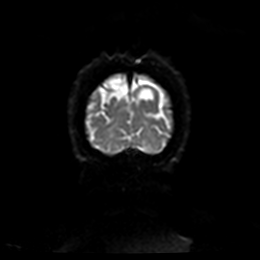
[im 13/68]
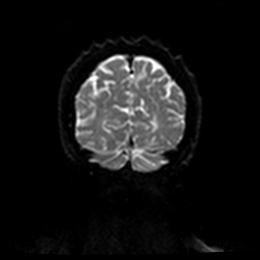
[im 19/68]
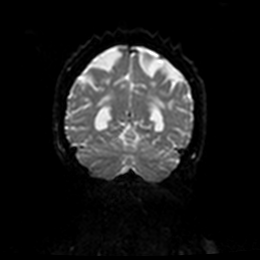
[im 25/68]
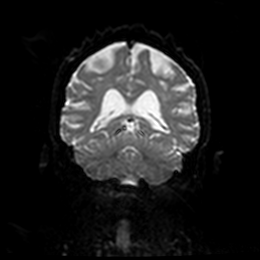
[im 31/68]
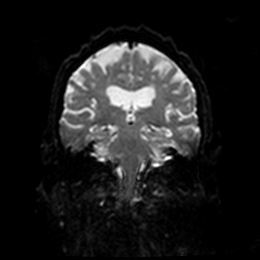
[im 37/68]
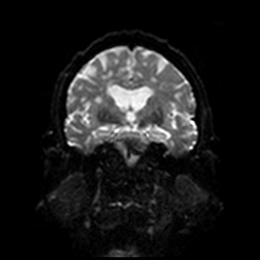
[im 43/68]
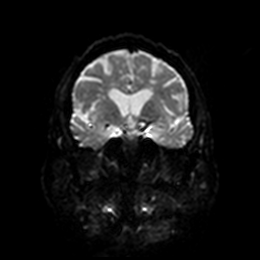
[im 49/68]
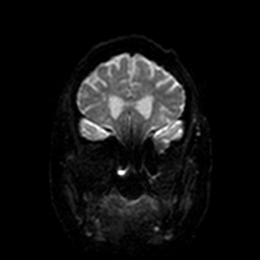
[im 55/68]
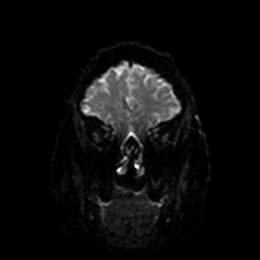
[im 61/68]
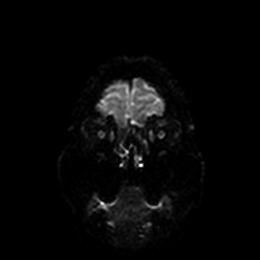
[im 68/68]
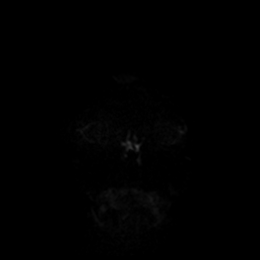

[Series 8: DWI · coronal · 4.0mm · 0.88mm/px · 6 of 34 slices shown (4 of 4)]
[im 1/34]
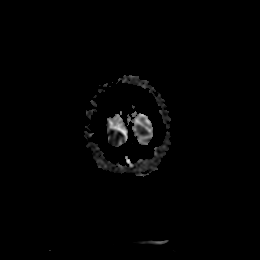
[im 7/34]
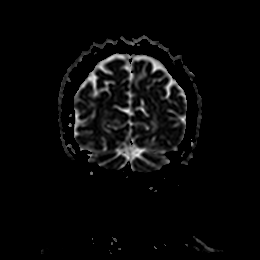
[im 14/34]
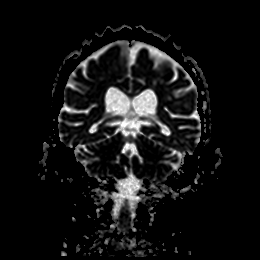
[im 20/34]
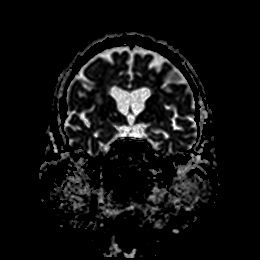
[im 27/34]
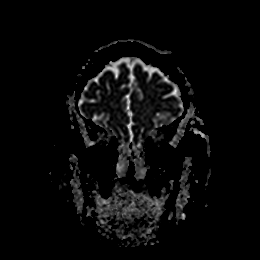
[im 34/34]
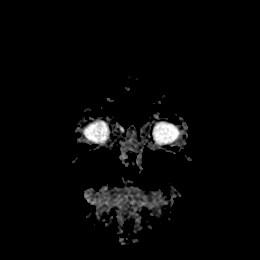

[Series 9: T1 · sagittal · 5.0mm · 0.75mm/px · 4 of 24 slices shown]
[im 1/24]
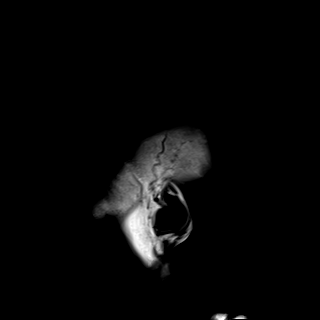
[im 8/24]
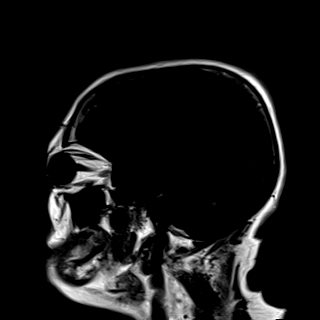
[im 16/24]
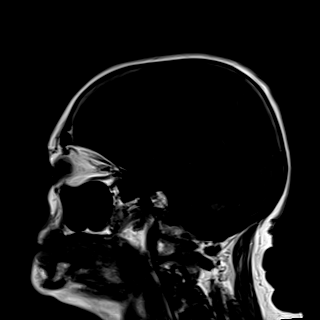
[im 24/24]
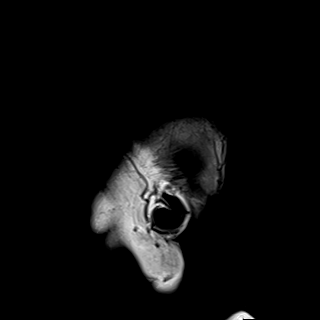

[48 of 48 positions shown; findings below may reference images not displayed]

FINDINGS: Brain: No acute infarction, hemorrhage, hydrocephalus, extra-axial
collection or mass lesion. Mild for age chronic small vessel
ischemia in the cerebral white matter. Cerebral volume loss in
keeping with age. Small remote left cerebellar infarction.

Vascular: Preserved flow voids

Skull and upper cervical spine: Partially covered hypointensity in
the C4 body, usually degenerative sclerosis. There is disc
narrowing, facet spurring, anterolisthesis at C3-4.

Sinuses/Orbits: Bilateral cataract resection

Other: Progressively motion degraded study, coronal T2 weighted
imaging was nondiagnostic.
IMPRESSION: 1. Senescent changes without acute intracranial finding.
2. Partially covered signal abnormality in the C4 body, likely
degenerative sclerosis. Consider confirmatory cervical spine CT
without contrast.
3. Motion degraded exam.

## 2020-06-25 MED ORDER — POTASSIUM CHLORIDE CRYS ER 20 MEQ PO TBCR
20.0000 meq | EXTENDED_RELEASE_TABLET | Freq: Once | ORAL | Status: AC
  Administered 2020-06-25: 20 meq via ORAL
  Filled 2020-06-25: qty 1

## 2020-06-25 MED ORDER — MOMETASONE FURO-FORMOTEROL FUM 200-5 MCG/ACT IN AERO
2.0000 | INHALATION_SPRAY | Freq: Two times a day (BID) | RESPIRATORY_TRACT | Status: DC
  Filled 2020-06-25: qty 8.8

## 2020-06-25 MED ORDER — ACETAMINOPHEN 325 MG PO TABS: 650.0000 mg | ORAL_TABLET | Freq: Four times a day (QID) | ORAL | Status: DC | PRN

## 2020-06-25 MED ORDER — POTASSIUM CHLORIDE CRYS ER 20 MEQ PO TBCR
20.0000 meq | EXTENDED_RELEASE_TABLET | Freq: Every day | ORAL | Status: DC
  Administered 2020-06-25: 20 meq via ORAL
  Filled 2020-06-25: qty 1

## 2020-06-25 MED ORDER — VITAMIN E 45 MG (100 UNIT) PO CAPS
400.0000 [IU] | ORAL_CAPSULE | Freq: Every day | ORAL | Status: DC
  Administered 2020-06-25: 400 [IU] via ORAL
  Filled 2020-06-25: qty 4

## 2020-06-25 MED ORDER — TRAMADOL HCL 50 MG PO TABS
50.0000 mg | ORAL_TABLET | Freq: Three times a day (TID) | ORAL | Status: DC | PRN
  Administered 2020-06-25: 50 mg via ORAL
  Filled 2020-06-25: qty 1

## 2020-06-25 MED ORDER — FUROSEMIDE 20 MG PO TABS
40.0000 mg | ORAL_TABLET | Freq: Every day | ORAL | Status: DC
  Administered 2020-06-25: 40 mg via ORAL
  Filled 2020-06-25: qty 2

## 2020-06-25 MED ORDER — ONDANSETRON HCL 4 MG/2ML IJ SOLN: 4.0000 mg | Freq: Four times a day (QID) | INTRAMUSCULAR | Status: DC | PRN

## 2020-06-25 MED ORDER — MELATONIN 5 MG PO CAPS: 1.0000 | ORAL_CAPSULE | Freq: Every day | ORAL | Status: DC

## 2020-06-25 MED ORDER — ENALAPRIL MALEATE 5 MG PO TABS
20.0000 mg | ORAL_TABLET | Freq: Two times a day (BID) | ORAL | Status: DC
  Administered 2020-06-25 (×2): 20 mg via ORAL
  Filled 2020-06-25 (×3): qty 4
  Filled 2020-06-25: qty 1

## 2020-06-25 MED ORDER — METOPROLOL TARTRATE 25 MG PO TABS
25.0000 mg | ORAL_TABLET | Freq: Two times a day (BID) | ORAL | Status: DC
  Administered 2020-06-25: 25 mg via ORAL
  Filled 2020-06-25: qty 1

## 2020-06-25 MED ORDER — INSULIN ASPART 100 UNIT/ML ~~LOC~~ SOLN
0.0000 [IU] | Freq: Three times a day (TID) | SUBCUTANEOUS | Status: DC
  Administered 2020-06-25: 3 [IU] via SUBCUTANEOUS
  Administered 2020-06-25: 2 [IU] via SUBCUTANEOUS

## 2020-06-25 MED ORDER — ROSUVASTATIN CALCIUM 20 MG PO TABS: 40.0000 mg | ORAL_TABLET | Freq: Every day | ORAL | Status: DC

## 2020-06-25 MED ORDER — SERTRALINE HCL 50 MG PO TABS
50.0000 mg | ORAL_TABLET | Freq: Every day | ORAL | Status: DC
  Filled 2020-06-25: qty 1

## 2020-06-25 MED ORDER — VITAMIN D 25 MCG (1000 UNIT) PO TABS: 1000.0000 [IU] | ORAL_TABLET | Freq: Every day | ORAL | Status: DC

## 2020-06-25 MED ORDER — SERTRALINE HCL 50 MG PO TABS: 100.0000 mg | ORAL_TABLET | Freq: Every day | ORAL | 0 refills | Status: DC

## 2020-06-25 MED ORDER — AMLODIPINE BESYLATE 5 MG PO TABS
10.0000 mg | ORAL_TABLET | Freq: Every day | ORAL | Status: DC
  Administered 2020-06-25: 10 mg via ORAL
  Filled 2020-06-25: qty 2

## 2020-06-25 MED ORDER — UMECLIDINIUM BROMIDE 62.5 MCG/INH IN AEPB
1.0000 | INHALATION_SPRAY | Freq: Every day | RESPIRATORY_TRACT | Status: DC
  Filled 2020-06-25: qty 7

## 2020-06-25 MED ORDER — ALBUTEROL SULFATE HFA 108 (90 BASE) MCG/ACT IN AERS: 1.0000 | INHALATION_SPRAY | RESPIRATORY_TRACT | Status: DC | PRN

## 2020-06-25 MED ORDER — PANTOPRAZOLE SODIUM 40 MG PO TBEC
40.0000 mg | DELAYED_RELEASE_TABLET | Freq: Every day | ORAL | Status: DC
  Administered 2020-06-25: 40 mg via ORAL
  Filled 2020-06-25: qty 1

## 2020-06-25 MED ORDER — ONDANSETRON HCL 4 MG PO TABS: 4.0000 mg | ORAL_TABLET | Freq: Four times a day (QID) | ORAL | Status: DC | PRN

## 2020-06-25 MED ORDER — ALBUTEROL SULFATE (2.5 MG/3ML) 0.083% IN NEBU: 2.5000 mg | INHALATION_SOLUTION | Freq: Four times a day (QID) | RESPIRATORY_TRACT | Status: DC | PRN

## 2020-06-25 MED ORDER — FOLIC ACID 1 MG PO TABS: 1000.0000 ug | ORAL_TABLET | Freq: Every day | ORAL | Status: DC

## 2020-06-25 MED ORDER — INSULIN GLARGINE 100 UNIT/ML ~~LOC~~ SOLN
18.0000 [IU] | Freq: Every day | SUBCUTANEOUS | Status: DC
  Administered 2020-06-25: 18 [IU] via SUBCUTANEOUS
  Filled 2020-06-25 (×2): qty 0.18

## 2020-06-25 MED ORDER — CLOPIDOGREL BISULFATE 75 MG PO TABS
75.0000 mg | ORAL_TABLET | Freq: Every day | ORAL | Status: DC
  Administered 2020-06-25: 75 mg via ORAL
  Filled 2020-06-25: qty 1

## 2020-06-25 MED ORDER — ASPIRIN 81 MG PO CHEW
81.0000 mg | CHEWABLE_TABLET | Freq: Every day | ORAL | Status: DC
  Administered 2020-06-25: 81 mg via ORAL
  Filled 2020-06-25: qty 1

## 2020-06-25 MED ORDER — ENOXAPARIN SODIUM 40 MG/0.4ML ~~LOC~~ SOLN
40.0000 mg | SUBCUTANEOUS | Status: DC
  Administered 2020-06-25: 40 mg via SUBCUTANEOUS
  Filled 2020-06-25: qty 0.4

## 2020-06-25 MED ORDER — LORAZEPAM 1 MG PO TABS
1.0000 mg | ORAL_TABLET | Freq: Once | ORAL | Status: AC | PRN
  Administered 2020-06-25: 1 mg via ORAL
  Filled 2020-06-25: qty 1

## 2020-06-25 MED ORDER — FENOFIBRATE 160 MG PO TABS
160.0000 mg | ORAL_TABLET | Freq: Every day | ORAL | Status: DC
  Administered 2020-06-25: 160 mg via ORAL
  Filled 2020-06-25: qty 1

## 2020-06-25 NOTE — Telephone Encounter (Signed)
    caller states that his mother's memory has deteriorated. She does not know who he is anymore. Evidently when he was speaking with her last night she said she was confused. She said "[name] said hes my son, and who are you?" Which are both her children. She mentioned her dead mother as well. Brother [name] also noticed her making some strange comments over the weekend. The son talks to her every night and is just now seeing tthis. She has an appointmnet tomorrow but thinks she should be seen today [facility] Translation No Nurse Assessment Nurse: Sumner Boast, RN, [place] Date/Time Eilene Ghazi Time): 06/24/2020 1:42:49 PM Confirm and document reason for call. If symptomatic, describe symptoms. ---Caller states that his mother's memory has deteriorated. She does not know who he is anymore. Evidently when he was speaking with her last night she said she was confused. She said "[name] said he's my son, and who are you?" Which are both her children. She mentioned her dead mother as well. Brother [name] also noticed her making some strange comments over the weekend. The son talks to her every night and is just now seeing this. She has an appointment tomorrow but thinks she should be seen today. Confusion started a few days ago and is confused today. No medical symptoms. Does the patient have any new or worsening symptoms? ---Yes Will a triage be completed? ---Yes Related visit to physician within the last 2 weeks? ---Yes Does the PT have any chronic conditions? (i.e. diabetes, asthma, this includes High risk factors for pregnancy, etc.) ---Yes List chronic conditions. ---COPD Diabetes, Type I CHF PLEASE NOTE: All timestamps contained within this report are represented as [place] Standard Time. CONFIDENTIALTY NOTICE: This fax transmission is intended only for the addressee. It contains information that is legally privileged, confidential or otherwise  protected from use or disclosure. If you are not the intended recipient, you are strictly prohibited from reviewing, disclosing, copying using or disseminating any of this information or taking any action in reliance on or regarding this information. If you have received this fax in error, please notify us immediately by telephone so that we can arrange for its return to [place]. Phone: [phone], Toll-Free: [phone], Fax: [phone] Page: 2 of 2 Call Id: [id number] Nurse Assessment Is this a behavioral health or substance abuse call? ---No Guidelines Guideline Title Affirmed Question Affirmed Notes Nurse Date/Time Eilene Ghazi Time) Confusion - Delirium [1] Difficult to awaken or acting confused (e.g., disoriented, slurred speech) AND [2] present now AND [3] diabetes mellitus Sumner Boast, RNEnid Derry 06/24/2020 1:47:14 PM Disp. Time Eilene Ghazi Time) Disposition Final User 06/24/2020 1:37:43 PM Send to Urgent Queue Cecille Aver 06/24/2020 2:00:13 PM [place], RN, [place] Reason: Son states he will not be calling 911. States he can drive her to the hospital. 06/24/2020 1:58:42 PM Call EMS 911 Now Yes Sumner Boast, RN, [employer]

## 2020-06-25 NOTE — ED Notes (Signed)
Patient transported to MRI 

## 2020-06-25 NOTE — ED Notes (Signed)
Breakfast ordered 

## 2020-06-25 NOTE — Telephone Encounter (Signed)
FYI

## 2020-06-25 NOTE — ED Notes (Signed)
Upon entering room, pt sitting at edge of stretcher with gown off, had removed all cardiac monitoring and pulled out IV site. Pt asked what was she doing and she responded I don't know. Pt re-oriented to room and surroundings at this time.

## 2020-06-25 NOTE — Telephone Encounter (Signed)
Evaluated in the ED 

## 2020-06-25 NOTE — ED Notes (Signed)
Pt is being discharged home, son at bedside and discharge instructions reviewed. Patient and son both with understanding and instructions

## 2020-06-25 NOTE — H&P (Signed)
History and Physical    Michelle [name] AYT:[id number] DOB: July 06, 1933 DOA: 06/24/2020  PCP: [name], MD  Patient coming from: Home  I have personally briefly reviewed patient's old medical records in [place]  Chief Complaint: Confusion  HPI: Michelle [name] is a 86 y.o. female with medical history significant of CAD, HTN, HLD, DM2, MI, COPD.  Pt presents to ED with intermittent confusion since Sat.  Having hallucinations per family.  Symptoms wax and wane.  Nothing seems to make better or worse.  No abd pain, fever, headaches, nausea, vomiting.  ED Course: While in ED pt developed episode of CP, now resolved.  Trop neg x2.  BPs running 355D systolic, given labetalol.  Currently pt AAOx3 without obvious findings of confusion on exam.  No focal neurologic findings either.   Review of Systems: As per HPI, otherwise all review of systems negative.  Past Medical History:  Diagnosis Date  . Anginal pain ([place])   . Arthritis   . AV block, 1st degree   . CAD (coronary artery disease) 1990; 2015   Cardiac cath 1990 with Dr. Lia Foyer and [name] Residual CAD, non obstructive per 2015 cath. STEMI status post stent in August 2015.  Marland Kitchen COPD (chronic obstructive pulmonary disease) ([place])   . Diabetes mellitus type 2, insulin dependent ([place])   . Essential hypertension   . Hyperlipidemia with target LDL less than 70   . Myocardial infarction ([place])   . Pericarditis-post MI (short course of steroids) 03/06/2014  . 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 Shortness of breath     Past Surgical History:  Procedure Laterality Date  . CATARACT EXTRACTION  2020   right and left eye   . CHOLECYSTECTOMY    . CORONARY ANGIOPLASTY WITH STENT PLACEMENT   02/18/14   Promus DES to RCA  . LEFT HEART CATHETERIZATION WITH CORONARY ANGIOGRAM N/A 02/18/2014   Procedure: LEFT HEART CATHETERIZATION WITH CORONARY ANGIOGRAM;  Surgeon: Jettie Booze, MD;  Location: [facility] CATH LAB;  Service: Cardiovascular;  Laterality: N/A;  . PTCA  1990   PTCA of RCA  . TRANSTHORACIC ECHOCARDIOGRAM  02/02/2012   mild LVH, EF 55-60%, Normal WM, Gr 1 DD; Mild MR     reports that she quit smoking about 13 years ago. Her smoking use included cigarettes. She has a 55.00 pack-year smoking history. She has never used smokeless tobacco. She reports that she does not drink alcohol and does not use drugs.  Allergies  Allergen Reactions  . Codeine Sulfate Nausea Only  . Morphine Sulfate Nausea Only    Family History  Problem Relation Age of Onset  . Heart attack Mother 93  . Cancer Father 30  . Cancer Maternal Grandfather   . Cancer Paternal Grandmother   . Cancer Paternal Grandfather   . Diabetes Son   . Liver cancer Brother   . Bone cancer Brother   . Heart attack Son   . Hypertension Son   . Diabetes Son   . Hyperlipidemia Son      Prior to Admission medications   Medication Sig Start Date End Date Taking? Authorizing Provider  ACCU-CHEK FASTCLIX LANCETS [place]  12/14/16  Yes [provider]  acetaminophen (TYLENOL) 325 MG  tablet Take 650 mg by mouth every 6 (six) hours as needed for mild pain. Reported on 10/23/2015   Yes [provider]  albuterol (PROVENTIL) (2.5 MG/3ML) 0.083% nebulizer solution USE THREE MILLILITERS VIA NEBULIZATION BY MOUTH EVERY 6 HOURS AS NEEDED FOR WHEEZING OR SHORTNESS OF BREATH Patient taking differently: Take 2.5 mg by nebulization every 6 (six) hours as needed for wheezing or shortness of breath.  01/31/20  Yes [name], MD  albuterol (VENTOLIN HFA) 108 (90 Base) MCG/ACT inhaler Inhale 1 puff into the lungs every 4 (four) hours as needed for wheezing or shortness of breath (RESCUE inhaler if advair not working).  05/11/20  Yes [name], MD  Alcohol Swabs (B-D SINGLE USE SWABS REGULAR) PADS USE  FOR  TESTING THREE TIMES DAILY 04/09/20  Yes [name], MD  amLODipine (NORVASC) 10 MG tablet TAKE 1 TABLET EVERY DAY Patient taking differently: Take 10 mg by mouth daily.  05/29/20  Yes [name], MD  aspirin 81 MG tablet Take 81 mg by mouth daily.  07/02/14  Yes [provider]  aspirin-sod bicarb-citric acid (ALKA-SELTZER) 325 MG TBEF tablet Take 325 mg by mouth every 6 (six) hours as needed (stomach ache).   Yes [provider]  Blood Glucose Calibration (ACCU-CHEK SMARTVIEW CONTROL) LIQD  01/11/17  Yes [provider]  cholecalciferol (VITAMIN D3) 25 MCG (1000 UNIT) tablet Take 1,000 Units by mouth daily.   Yes [provider]  clopidogrel (PLAVIX) 75 MG tablet TAKE ONE TABLET BY MOUTH DAILY **PLEASE KEEP UPCOMING APPOINTMENT IN NOVEMBER WITH DOCTOR [facility] FOR FUTURE REFILLS Patient taking differently: Take 75 mg by mouth daily.  02/06/20  Yes [name], MD  enalapril (VASOTEC) 20 MG tablet TAKE 1 TABLET TWICE DAILY Patient taking differently: Take 20 mg by mouth 2 (two) times daily.  01/21/20  Yes [name], MD  fenofibrate micronized (LOFIBRA) 134 MG capsule TAKE ONE CAPSULE BY MOUTH DAILY Patient taking differently: Take 134 mg by mouth daily before breakfast.  10/09/19  Yes [name], MD  ferrous sulfate 325 (65 FE) MG tablet Twice a week 02/21/20  Yes [name], MD  fish oil-omega-3 fatty acids 1000 MG capsule Take 2 g by mouth daily.     Yes [provider]  Fluticasone-Salmeterol (ADVAIR) 250-50 MCG/DOSE AEPB INHALE ONE PUFF BY MOUTH TWICE DAILY Patient taking differently: Inhale 1 puff into the lungs in the morning and at bedtime. INHALE ONE PUFF BY MOUTH TWICE DAILY 05/18/20  Yes [name], MD  folic acid (FOLVITE) 466 MCG tablet Take 400 mcg by mouth daily.     Yes [provider]   furosemide (LASIX) 40 MG tablet Take 1 tablet (40 mg total) by mouth daily. 04/07/20  Yes [name], MD  glucose blood (ACCU-CHEK SMARTVIEW) test strip TEST BLOOD SUGAR THREE TIMES DAILY 05/28/20  Yes [name], MD  insulin glargine (LANTUS SOLOSTAR) 100 UNIT/ML Solostar Pen INJECT  17  TO  20 UNITS SUBCUTANEOUSLY EVERY MORNING Patient taking differently: Inject 18 Units into the skin daily.  09/23/19  Yes [name], MD  Insulin Pen Needle (B-D ULTRAFINE III SHORT PEN) 31G X 8 MM MISC USE AS DIRECTED WITH LANTUS SOLOSTAR dx. e11.9 05/28/20  Yes [name], MD  KLOR-CON M20 20 MEQ tablet TAKE ONE TABLET BY MOUTH DAILY Patient taking differently: Take 20 mEq by mouth daily.  09/09/19  Yes Burnell Blanks, MD  Melatonin 5 MG CAPS Take 1 capsule by mouth at bedtime.    Yes [provider]  metFORMIN (GLUCOPHAGE) 1000 MG tablet TAKE 1 TABLET TWICE DAILY Patient taking differently: Take 1,000 mg by mouth 2 (two) times daily with a meal.  05/04/20  Yes [name], MD  metoprolol tartrate (LOPRESSOR) 25 MG tablet TAKE 1 TABLET TWICE DAILY Patient taking differently: Take 25 mg by mouth 2 (two) times daily.  04/17/20  Yes [name], MD  nitroGLYCERIN (NITROSTAT) 0.4 MG SL tablet PLACE 1 TABLET (0.4 MG TOTAL) UNDER THE TONGUE EVERY 5 (FIVE) MINUTESAS NEEDED FOR CHEST PAIN (CHEST PAIN OR SHORTNESS OF BREATH). Patient taking differently: Place 0.4 mg under the tongue every 5 (five) minutes as needed for chest pain.  07/29/19  Yes Burnell Blanks, MD  pantoprazole (PROTONIX) 40 MG tablet Take 1 tablet (40 mg total) by mouth daily. 02/06/20  Yes [name], MD  rosuvastatin (CRESTOR) 40 MG tablet TAKE 1 TABLET EVERY DAY Patient taking differently: Take 40 mg by mouth daily.  03/20/20  Yes [name], MD  sertraline (ZOLOFT) 50 MG tablet Take 1 tablet (50 mg total) by mouth at bedtime. 11/21/19  Yes [name], MD  SPIRIVA  HANDIHALER 18 MCG inhalation capsule PLACE 1 CAPSULE INTO HANDIHALER AND INHALE CONTENTS OF 1 CAPSULE EVERY DAY AS DIRECTED AT 2 PM Patient taking differently: Place 18 mcg into inhaler and inhale daily.  04/09/20  Yes [name], MD  traMADol (ULTRAM) 50 MG tablet TAKE ONE TABLET BY MOUTH THREE TIMES A DAY AS NEEDED Patient taking differently: Take 50 mg by mouth 3 (three) times daily as needed for moderate pain.  02/18/20  Yes [name], MD  vitamin E 400 UNIT capsule Take 400 Units by mouth daily.     Yes [provider]  blood glucose meter kit and supplies KIT Dispense based on patient and insurance preference. Use up to four times daily as directed. (FOR ICD-9 250.00, 250.01). 05/28/20   [name], MD    Physical Exam: Vitals:   06/25/20 0230 06/25/20 0300 06/25/20 0400 06/25/20 0416  BP: (!) 183/83 (!) 174/81 (!) 166/88   Pulse: 87 98 94   Resp: (!) 25 (!) 22 20   Temp:    99 F (37.2 C)  TempSrc:    Tympanic  SpO2: 98% 97% 94%     Constitutional: NAD, calm, comfortable Eyes: PERRL, lids and conjunctivae normal ENMT: Mucous membranes are moist. Posterior pharynx clear of any exudate or lesions.Normal dentition.  Neck: normal, supple, no masses, no thyromegaly Respiratory: clear to auscultation bilaterally, no wheezing, no crackles. Normal respiratory effort. No accessory muscle use.  Cardiovascular: Regular rate and rhythm, no murmurs / rubs / gallops. No extremity edema. 2+ pedal pulses. No carotid bruits.  Abdomen: no tenderness, no masses palpated. No hepatosplenomegaly. Bowel sounds positive.  Musculoskeletal: no clubbing / cyanosis. No joint deformity upper and lower extremities. Good ROM, no contractures. Normal muscle tone.  Skin: no rashes, lesions, ulcers. No induration Neurologic: CN 2-12 grossly intact. Sensation intact, DTR normal. Strength 5/5 in all 4.  Psychiatric: Normal judgment and insight. Alert and oriented x 3. Normal mood.     Labs on Admission: I have personally reviewed following labs and imaging studies  CBC: Recent Labs  Lab 06/24/20 1657  WBC 10.3  HGB 14.9  HCT 43.2  MCV 87.6  PLT 569   Basic Metabolic Panel: Recent Labs  Lab 06/24/20 1657  NA 141  K 3.4*  CL 100  CO2 28  GLUCOSE 106*  BUN 13  CREATININE 0.79  CALCIUM 10.0   GFR: CrCl cannot be calculated (Unknown ideal weight.). Liver Function Tests: Recent Labs  Lab 06/24/20 1657  AST 21  ALT 19  ALKPHOS 49  BILITOT 0.8  PROT 7.7  ALBUMIN 4.1   Recent Labs  Lab 06/24/20 2222  LIPASE 31   Recent Labs  Lab 06/24/20 2215  AMMONIA 43*   Coagulation Profile: No results for input(s): INR, PROTIME in the last 168 hours. Cardiac Enzymes: No results for input(s): CKTOTAL, CKMB, CKMBINDEX, TROPONINI in the last 168 hours. BNP (last 3 results) No results for input(s): PROBNP in the last 8760 hours. HbA1C: No results for input(s): HGBA1C in the last 72 hours. CBG: No results for input(s): GLUCAP in the last 168 hours. Lipid Profile: No results for input(s): CHOL, HDL, LDLCALC, TRIG, CHOLHDL, LDLDIRECT in the last 72 hours. Thyroid Function Tests: Recent Labs    06/24/20 2215  TSH 1.680   Anemia Panel: No results for input(s): VITAMINB12, FOLATE, FERRITIN, TIBC, IRON, RETICCTPCT in the last 72 hours. Urine analysis:    Component Value Date/Time   COLORURINE YELLOW 06/24/2020 2222   APPEARANCEUR CLEAR 06/24/2020 2222   LABSPEC 1.014 06/24/2020 2222   PHURINE 5.0 06/24/2020 2222   GLUCOSEU NEGATIVE 06/24/2020 2222   HGBUR SMALL (A) 06/24/2020 2222   BILIRUBINUR NEGATIVE 06/24/2020 2222   BILIRUBINUR Negative 02/22/2019 1616   KETONESUR NEGATIVE 06/24/2020 2222   PROTEINUR >=300 (A) 06/24/2020 2222   UROBILINOGEN 0.2 02/22/2019 1616   NITRITE NEGATIVE 06/24/2020 2222   LEUKOCYTESUR NEGATIVE 06/24/2020 2222    Radiological Exams on Admission: DG Chest 2 View  Result Date: 06/24/2020 CLINICAL DATA:  Chest  pain altered EXAM: CHEST - 2 VIEW COMPARISON:  12/14/2014 FINDINGS: No focal opacity or pleural effusion. Normal cardiomediastinal silhouette with aortic atherosclerosis. No pneumothorax. IMPRESSION: No active cardiopulmonary disease. Electronically Signed   By: Donavan Foil M.D.   On: 06/24/2020 23:44   CT Head Wo Contrast  Result Date: 06/24/2020 CLINICAL DATA:  Altered mental status. EXAM: CT HEAD WITHOUT CONTRAST TECHNIQUE: Contiguous axial images were obtained from the base of the skull through the vertex without intravenous contrast. COMPARISON:  None. FINDINGS: Brain: There is mild to moderate severity cerebral atrophy with widening of the extra-axial spaces and ventricular dilatation. There are areas of decreased attenuation within the white matter tracts of the supratentorial brain, consistent with microvascular disease changes. Vascular: No hyperdense vessel or unexpected calcification. Skull: Normal. Negative for fracture or focal lesion. Sinuses/Orbits: No acute finding. Other: None. IMPRESSION: 1. Generalized cerebral atrophy. 2. No acute intracranial abnormality. Electronically Signed   By: Virgina Norfolk M.D.   On: 06/24/2020 22:45    EKG: Independently reviewed.  Assessment/Plan Principal Problem:   Delirium Active Problems:   IDDM type 2   Hyperlipidemia associated with type 2 diabetes mellitus ([place])   Hypertension associated with diabetes ([place])   COPD (chronic obstructive pulmonary disease) ([place])    1. Delirium - 1. Description of symptoms sounds like delirium.  Currently pt asymptomatic at the moment and AAOx3. 2. Currently work up significant only for mildly elevated ammonia and severe HTN in ED. 3. Will hold off on treating ammonia for the moment given very mild elevation. 4. Severe HTN: 1. Given labetalol in ED 2. Pt could have PRES or could even have ischemic stroke. 3. Getting MRI brain to try  and differentiate. 4. If MRI neg then treat as HTN urgency 5. If MRI  positive however, then needs neuro consult, stroke work up, etc. 2. DM2 - 1. Cont home lantus 2. Mod scale SSI AC 3. Hold metformin 3. HTN - 1. As discussed above 2. Right now plan is to continue all home BP meds 3. And add PRN if MRI neg 4. COPD - Cont home nebs 5. HLD - cont statin  DVT prophylaxis: Lovenox Code Status: Full Family Communication: No family in room Disposition Plan: Home after AMS work up / resolved Consults called: None Admission status: Place in obs   Silvana Holecek, [place] [facility]  How to contact the [facility] Attending or Consulting provider [place] or covering provider during after hours [place], for this patient?  1. Check the care team in [facility] and look for a) attending/consulting [facility] provider listed and b) the [facility] team listed 2. Log into [email or web address]  Amion Physician Scheduling and messaging for groups and whole hospitals  On call and physician scheduling software for group practices, residents, hospitalists and other medical providers for call, clinic, rotation and shift schedules. On[employer] is a hospital-wide system for scheduling doctors and paging doctors on call. EasyPlot is for scientific plotting and data analysis.  [email or web address]  and use [place]'s universal password to access. If you do not have the password, please contact the hospital operator.  3. Locate the [facility] provider you are looking for under [facility] and page to a number that you can be directly reached. 4. If you still have difficulty reaching the provider, please page the [facility] (Director on Call) for the Hospitalists listed on amion for assistance.  06/25/2020, 4:40 AM

## 2020-06-25 NOTE — Discharge Summary (Signed)
Physician Discharge Summary  Michelle [name] SWN:[id number] DOB: 1932/10/25 DOA: 06/24/2020  PCP: [name], MD  Admit date: 06/24/2020 Discharge date: 06/25/2020  Admitted From: Home Discharge disposition: Home   Code Status: Full Code  Diet Recommendation: Cardiac/diabetic diet  Discharge Diagnosis:   Principal Problem:   Delirium Active Problems:   IDDM type 2   Hyperlipidemia associated with type 2 diabetes mellitus ([place])   Hypertension associated with diabetes ([place])   COPD (chronic obstructive pulmonary disease) ([place])  History of Present Illness / Brief narrative:  Michelle [name] is a 86 y.o. female with PMH significant for DM2, HTN, HLD, CAD/MI, COPD.  Patient presented to the ED on 12/8 with intermittent confusion and hallucinations for the last 5 days. In the ED, she was afebrile, heart rate in 80s, blood pressure was elevated up to 190/90, breathing room air.  She also complained of one episode of chest pain while in the ED. Troponin negative x2. Labs unremarkable except potassium 3.4 MRI brain unremarkable except for senescent changes.  Subjective:  Seen and examined this morning and this afternoon.  Pleasant elderly Caucasian female.  Lying down in bed.  Not in distress.    Hospital Course:  Intermittent delirium/disorientation -Normal MRI of her brain.  No evidence of infection.   -Upon my examination this morning, Able to answer all orientation questions.  Denies burning urination.  Reports compliance to her medications. 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  Sons also mentioned that patient has not had adequate sleep last several weeks. She takes Zoloft 50 mg and melatonin 5 mg at bedtime.  She was tried on a higher dose of melatonin by her PCP but that did not show any effect and  hence was scaled back to 5 mg. -We discussed about increasing the dose of Zoloft to 100 mg daily.  Both her sons agreed to the plan.  We will get the patient discharged today.  Uncontrolled hypertension -Blood pressure elevated up to 190s in last 24 hours.  Unsure compliance -Home meds include Metoprolol 25 mg twice daily, amlodipine 10 mg daily, enalapril 20 mg twice daily, Lasix 40 mg daily. -Reports compliance to her medications.  But with resumption of the same blood pressure medicines in the hospital, blood pressure is better. -Advised patient to keep a log of her daily blood pressure reading and get medications adjusted by her primary care doctor.  CAD, MI Hyperlipidemia -Currently does not have any cardiac symptoms. -Continue Aspirin 81 mg daily, Plavix 75 mg daily, Fenofibrate 134 mg daily, Crestor 40 mg daily.  Diabetes mellitus type 2 -A1c 6.6 on 12/9.   -Home meds include Lantus 18 units subcu daily, Metformin 1000 mg twice daily, -Continue the same at home.  ?GERD, iron deficiency -Continue Ferrous sulfate twice a week, Protonix 40 mg daily -Hemoglobin normal.  Stable for discharge to home today.  Wound care:    Discharge Exam:   Vitals:   06/25/20 0945 06/25/20 1107 06/25/20 1200 06/25/20 1400  BP: 134/83 (!) 175/108 (!) 179/81 137/73  Pulse: 74 90 72 77  Resp: '20 20 19 ' (!) 25  Temp:      TempSrc:      SpO2: 99% 97% 97% 96%    There is no height or weight on file to calculate BMI.  General exam: Pleasant, not  in distress Skin: No rashes, lesions or ulcers. HEENT: Atraumatic, normocephalic, no obvious bleeding Lungs: Clear to auscultation bilaterally CVS: Regular rate and rhythm, no murmur GI/Abd soft, nontender, nondistended, bowel sound present CNS: Alert, awake, oriented x3 Psychiatry: Mood appropriate Extremities: No pedal edema, no calf tenderness  Follow ups:   Discharge Instructions    Diet - low sodium heart healthy   Complete by: As  directed    Increase activity slowly   Complete by: As directed       Follow-up Information    [name], MD Follow up.   Specialty: Family Medicine Contact information: [address] [place] [address] [phone]        Burnell Blanks, MD .   Specialty: Cardiology Contact information: [address] [place] [address] [phone]               Recommendations for Outpatient Follow-Up:   1. Follow-up with PCP as an outpatient 2. Neurology referral given.  Discharge Instructions:  Follow with Primary MD [name], MD in 7 days   Get CBC/BMP checked in next visit within 1 week by PCP or SNF MD ( we routinely change or add medications that can affect your baseline labs and fluid status, therefore we recommend that you get the mentioned basic workup next visit with your PCP, your PCP may decide not to get them or add new tests based on their clinical decision)  On your next visit with your PCP, please Get Medicines reviewed and adjusted.  Please request your PCP  to go over all Hospital Tests and Procedure/Radiological results at the follow up, please get all Hospital records sent to your Prim MD by signing hospital release before you go home.  Activity: As tolerated with Full fall precautions use walker/cane & assistance as needed  For Heart failure patients - Check your Weight same time everyday, if you gain over 2 pounds, or you develop in leg swelling, experience more shortness of breath or chest pain, call your Primary MD immediately. Follow Cardiac Low Salt Diet and 1.5 lit/day fluid restriction.  If you have smoked or chewed Tobacco in the last 2 yrs please stop smoking, stop any regular Alcohol  and or any Recreational drug use.  If you experience worsening of your admission symptoms, develop shortness of breath, life threatening emergency, suicidal or homicidal thoughts you must seek medical attention  immediately by calling 911 or calling your MD immediately  if symptoms less severe.  You Must read complete instructions/literature along with all the possible adverse reactions/side effects for all the Medicines you take and that have been prescribed to you. Take any new Medicines after you have completely understood and accpet all the possible adverse reactions/side effects.   Do not drive, operate heavy machinery, perform activities at heights, swimming or participation in water activities or provide baby sitting services if your were admitted for syncope or siezures until you have seen by Primary MD or a Neurologist and advised to do so again.  Do not drive when taking Pain medications.  Do not take more than prescribed Pain, Sleep and Anxiety Medications  Wear Seat belts while driving.   Please note You were cared for by a hospitalist during your hospital stay. If you have any questions about your discharge medications or the care you received while you were in the hospital after you are discharged, you can call the unit and asked to speak with the hospitalist  on call if the hospitalist that took care of you is not available. Once you are discharged, your primary care physician will handle any further medical issues. Please note that NO REFILLS for any discharge medications will be authorized once you are discharged, as it is imperative that you return to your primary care physician (or establish a relationship with a primary care physician if you do not have one) for your aftercare needs so that they can reassess your need for medications and monitor your lab values.    Allergies as of 06/25/2020      Reactions   Codeine Sulfate Nausea Only   Morphine Sulfate Nausea Only      Medication List    TAKE these medications   Accu-Chek FastClix Lancets Misc   Accu-Chek SmartView Control Liqd   Accu-Chek SmartView test strip Generic drug: glucose blood TEST BLOOD SUGAR THREE TIMES DAILY    acetaminophen 325 MG tablet Commonly known as: TYLENOL Take 650 mg by mouth every 6 (six) hours as needed for mild pain. Reported on 10/23/2015   albuterol (2.5 MG/3ML) 0.083% nebulizer solution Commonly known as: PROVENTIL USE THREE MILLILITERS VIA NEBULIZATION BY MOUTH EVERY 6 HOURS AS NEEDED FOR WHEEZING OR SHORTNESS OF BREATH What changed: See the new instructions.   albuterol 108 (90 Base) MCG/ACT inhaler Commonly known as: VENTOLIN HFA Inhale 1 puff into the lungs every 4 (four) hours as needed for wheezing or shortness of breath (RESCUE inhaler if advair not working). What changed: Another medication with the same name was changed. Make sure you understand how and when to take each.   amLODipine 10 MG tablet Commonly known as: NORVASC TAKE 1 TABLET EVERY DAY   aspirin 81 MG tablet Take 81 mg by mouth daily.   aspirin-sod bicarb-citric acid 325 MG Tbef tablet Commonly known as: ALKA-SELTZER Take 325 mg by mouth every 6 (six) hours as needed (stomach ache).   B-D SINGLE USE SWABS REGULAR Pads USE  FOR  TESTING THREE TIMES DAILY   B-D ULTRAFINE III SHORT PEN 31G X 8 MM Misc Generic drug: Insulin Pen Needle USE AS DIRECTED WITH LANTUS SOLOSTAR dx. e11.9   blood glucose meter kit and supplies Kit Dispense based on patient and insurance preference. Use up to four times daily as directed. (FOR ICD-9 250.00, 250.01).   cholecalciferol 25 MCG (1000 UNIT) tablet Commonly known as: VITAMIN D3 Take 1,000 Units by mouth daily.   clopidogrel 75 MG tablet Commonly known as: PLAVIX TAKE ONE TABLET BY MOUTH DAILY **PLEASE KEEP UPCOMING APPOINTMENT IN NOVEMBER WITH DOCTOR [facility] FOR FUTURE REFILLS What changed:   how much to take  how to take this  when to take this  additional instructions   enalapril 20 MG tablet Commonly known as: VASOTEC TAKE 1 TABLET TWICE DAILY   fenofibrate micronized 134 MG capsule Commonly known as: LOFIBRA TAKE ONE CAPSULE BY MOUTH  DAILY What changed: when to take this   ferrous sulfate 325 (65 FE) MG tablet Twice a week   fish oil-omega-3 fatty acids 1000 MG capsule Take 2 g by mouth daily.   Fluticasone-Salmeterol 250-50 MCG/DOSE Aepb Commonly known as: ADVAIR INHALE ONE PUFF BY MOUTH TWICE DAILY What changed:   how much to take  how to take this  when to take this   folic acid 676 MCG tablet Commonly known as: FOLVITE Take 400 mcg by mouth daily.   furosemide 40 MG tablet Commonly known as: LASIX Take 1 tablet (40 mg total) by  mouth daily.   Klor-Con M20 20 MEQ tablet Generic drug: potassium chloride SA TAKE ONE TABLET BY MOUTH DAILY What changed: how much to take   Lantus SoloStar 100 UNIT/ML Solostar Pen Generic drug: insulin glargine INJECT  17  TO  20 UNITS SUBCUTANEOUSLY EVERY MORNING What changed:   how much to take  how to take this  when to take this  additional instructions   Melatonin 5 MG Caps Take 1 capsule by mouth at bedtime.   metFORMIN 1000 MG tablet Commonly known as: GLUCOPHAGE TAKE 1 TABLET TWICE DAILY What changed: when to take this   metoprolol tartrate 25 MG tablet Commonly known as: LOPRESSOR TAKE 1 TABLET TWICE DAILY   nitroGLYCERIN 0.4 MG SL tablet Commonly known as: NITROSTAT PLACE 1 TABLET (0.4 MG TOTAL) UNDER THE TONGUE EVERY 5 (FIVE) MINUTESAS NEEDED FOR CHEST PAIN (CHEST PAIN OR SHORTNESS OF BREATH). What changed:   how much to take  how to take this  when to take this  reasons to take this  additional instructions   pantoprazole 40 MG tablet Commonly known as: PROTONIX Take 1 tablet (40 mg total) by mouth daily.   rosuvastatin 40 MG tablet Commonly known as: CRESTOR TAKE 1 TABLET EVERY DAY   sertraline 50 MG tablet Commonly known as: ZOLOFT Take 2 tablets (100 mg total) by mouth at bedtime. What changed: how much to take   Spiriva HandiHaler 18 MCG inhalation capsule Generic drug: tiotropium PLACE 1 CAPSULE INTO HANDIHALER  AND INHALE CONTENTS OF 1 CAPSULE EVERY DAY AS DIRECTED AT 2 PM What changed: See the new instructions.   traMADol 50 MG tablet Commonly known as: ULTRAM TAKE ONE TABLET BY MOUTH THREE TIMES A DAY AS NEEDED What changed:   how much to take  how to take this  when to take this  reasons to take this  additional instructions   vitamin E 180 MG (400 UNITS) capsule Take 400 Units by mouth daily.       Time coordinating discharge: 35 minutes  The results of significant diagnostics from this hospitalization (including imaging, microbiology, ancillary and laboratory) are listed below for reference.    Procedures and Diagnostic Studies:   DG Chest 2 View  Result Date: 06/24/2020 CLINICAL DATA:  Chest pain altered EXAM: CHEST - 2 VIEW COMPARISON:  12/14/2014 FINDINGS: No focal opacity or pleural effusion. Normal cardiomediastinal silhouette with aortic atherosclerosis. No pneumothorax. IMPRESSION: No active cardiopulmonary disease. Electronically Signed   By: Donavan Foil M.D.   On: 06/24/2020 23:44   CT Head Wo Contrast  Result Date: 06/24/2020 CLINICAL DATA:  Altered mental status. EXAM: CT HEAD WITHOUT CONTRAST TECHNIQUE: Contiguous axial images were obtained from the base of the skull through the vertex without intravenous contrast. COMPARISON:  None. FINDINGS: Brain: There is mild to moderate severity cerebral atrophy with widening of the extra-axial spaces and ventricular dilatation. There are areas of decreased attenuation within the white matter tracts of the supratentorial brain, consistent with microvascular disease changes. Vascular: No hyperdense vessel or unexpected calcification. Skull: Normal. Negative for fracture or focal lesion. Sinuses/Orbits: No acute finding. Other: None. IMPRESSION: 1. Generalized cerebral atrophy. 2. No acute intracranial abnormality. Electronically Signed   By: Virgina Norfolk M.D.   On: 06/24/2020 22:45   MR BRAIN WO CONTRAST  Result Date:  06/25/2020 CLINICAL DATA:  Delirium EXAM: MRI HEAD WITHOUT CONTRAST TECHNIQUE: Multiplanar, multiecho pulse sequences of the brain and surrounding structures were obtained without intravenous contrast. COMPARISON:  Head CT  from yesterday FINDINGS: Brain: No acute infarction, hemorrhage, hydrocephalus, extra-axial collection or mass lesion. Mild for age chronic small vessel ischemia in the cerebral white matter. Cerebral volume loss in keeping with age. Small remote left cerebellar infarction. Vascular: Preserved flow voids Skull and upper cervical spine: Partially covered hypointensity in the C4 body, usually degenerative sclerosis. There is disc narrowing, facet spurring, anterolisthesis at C3-4. Sinuses/Orbits: Bilateral cataract resection Other: Progressively motion degraded study, coronal T2 weighted imaging was nondiagnostic. IMPRESSION: 1. Senescent changes without acute intracranial finding. 2. Partially covered signal abnormality in the C4 body, likely degenerative sclerosis. Consider confirmatory cervical spine CT without contrast. 3. Motion degraded exam. Electronically Signed   By: Monte Fantasia M.D.   On: 06/25/2020 07:43     Labs:   Basic Metabolic Panel: Recent Labs  Lab 06/24/20 1657 06/25/20 0612  NA 141 137  K 3.4* 3.2*  CL 100 101  CO2 28 22  GLUCOSE 106* 150*  BUN 13 17  CREATININE 0.79 0.77  CALCIUM 10.0 9.4   GFR CrCl cannot be calculated (Unknown ideal weight.). Liver Function Tests: Recent Labs  Lab 06/24/20 1657 06/25/20 0612  AST 21 21  ALT 19 16  ALKPHOS 49 45  BILITOT 0.8 0.6  PROT 7.7 6.9  ALBUMIN 4.1 3.7   Recent Labs  Lab 06/24/20 2222  LIPASE 31   Recent Labs  Lab 06/24/20 2215  AMMONIA 43*   Coagulation profile No results for input(s): INR, PROTIME in the last 168 hours.  CBC: Recent Labs  Lab 06/24/20 1657 06/25/20 0612  WBC 10.3 10.5  HGB 14.9 14.6  HCT 43.2 42.4  MCV 87.6 86.4  PLT 305 251   Cardiac Enzymes: No results  for input(s): CKTOTAL, CKMB, CKMBINDEX, TROPONINI in the last 168 hours. BNP: Invalid input(s): POCBNP CBG: Recent Labs  Lab 06/25/20 0749 06/25/20 1156  GLUCAP 152* 134*   D-Dimer No results for input(s): DDIMER in the last 72 hours. Hgb A1c Recent Labs    06/25/20 0612  HGBA1C 6.6*   Lipid Profile No results for input(s): CHOL, HDL, LDLCALC, TRIG, CHOLHDL, LDLDIRECT in the last 72 hours. Thyroid function studies Recent Labs    06/24/20 2215  TSH 1.680   Anemia work up No results for input(s): VITAMINB12, FOLATE, FERRITIN, TIBC, IRON, RETICCTPCT in the last 72 hours. Microbiology Recent Results (from the past 240 hour(s))  Resp Panel by RT-PCR (Flu A&B, Covid) Nasopharyngeal Swab     Status: None   Collection Time: 06/24/20 11:21 PM   Specimen: Nasopharyngeal Swab; Nasopharyngeal(NP) swabs in vial transport medium  Result Value Ref Range Status   SARS Coronavirus 2 by RT PCR NEGATIVE NEGATIVE Final    Comment: (NOTE) SARS-CoV-2 target nucleic acids are NOT DETECTED.  The SARS-CoV-2 RNA is generally detectable in upper respiratory specimens during the acute phase of infection. The lowest concentration of SARS-CoV-2 viral copies this assay can detect is 138 copies/mL. A negative result does not preclude SARS-Cov-2 infection and should not be used as the sole basis for treatment or other patient management decisions. A negative result may occur with  improper specimen collection/handling, submission of specimen other than nasopharyngeal swab, presence of viral mutation(s) within the areas targeted by this assay, and inadequate number of viral copies(<138 copies/mL). A negative result must be combined with clinical observations, patient history, and epidemiological information. The expected result is Negative.  Fact Sheet for Patients:  EntrepreneurPulse.com.au  Fact Sheet for Healthcare Providers:  IncredibleEmployment.be  This  test is  no t yet approved or cleared by the [place] and  has been authorized for detection and/or diagnosis of SARS-CoV-2 by FDA under an Emergency Use Authorization (EUA). This EUA will remain  in effect (meaning this test can be used) for the duration of the COVID-19 declaration under Section 564(b)(1) of the Act, 21 U.S.C.section 360bbb-3(b)(1), unless the authorization is terminated  or revoked sooner.       Influenza A by PCR NEGATIVE NEGATIVE Final   Influenza B by PCR NEGATIVE NEGATIVE Final    Comment: (NOTE) The Xpert Xpress SARS-CoV-2/FLU/RSV plus assay is intended as an aid in the diagnosis of influenza from Nasopharyngeal swab specimens and should not be used as a sole basis for treatment. Nasal washings and aspirates are unacceptable for Xpert Xpress SARS-CoV-2/FLU/RSV testing.  Fact Sheet for Patients: EntrepreneurPulse.com.au  Fact Sheet for Healthcare Providers: IncredibleEmployment.be  This test is not yet approved or cleared by the [place] FDA and has been authorized for detection and/or diagnosis of SARS-CoV-2 by FDA under an Emergency Use Authorization (EUA). This EUA will remain in effect (meaning this test can be used) for the duration of the COVID-19 declaration under Section 564(b)(1) of the Act, 21 U.S.C. section 360bbb-3(b)(1), unless the authorization is terminated or revoked.  Performed at [facility], [place] [address] [place], [place] [address]      Signed: Terrilee Croak  [facility] 06/25/2020, 3:32 PM

## 2020-06-26 LAB — URINE CULTURE: Culture: NO GROWTH

## 2020-06-30 NOTE — Patient Instructions (Addendum)
Health Maintenance Due  Topic Date Due  . OPHTHALMOLOGY EXAM Has not been scheduled due to recent hospital admission. Please schedule when things more stable 04/28/2018  . COVID-19 Vaccine (3 - Moderna risk 4-dose series) Has not received due to recent hospital admission. 12/20/2019   We will call you within two weeks about your referral for ultrasound of renal arteries due to high blood pressure. If you do not hear within 3 weeks, give [place] a call.   Start spironolactone. Schedule lab visit in 2 days to recheck potassium (im going to order after we get labs back- please make sure they mention that when they call with results or on mychart) but go ahead and schedule lab visit today.   Stop potassium for now unless potassium low on labs today  We will call you within two weeks about your referral to home health. If you do not hear within 3 weeks, give [place] a call.   Call neurology back to schedule  Please stop by lab before you go If you have mychart- we will send your results within 3 business days of [place] receiving them.  If you do not have mychart- we will call you about results within 5 business days of [place] receiving them.  *please note we are currently using [facility] which has a longer processing time than [place] typically so labs may not come back as quickly as in the past *please also note that you will see labs on mychart as soon as they post. I will later go in and write notes on them- will say "notes from Dr. Yong Channel"  2 week follow up - can use same day slot

## 2020-06-30 NOTE — Progress Notes (Signed)
Phone [phone] In person visit   Subjective:   Michelle [name] is a 86 y.o. year old very pleasant female patient who presents for/with See problem oriented charting Chief Complaint  Patient presents with  . Hospital F/U   This visit occurred during the SARS-CoV-2 public health emergency.  Safety protocols were in place, including screening questions prior to the visit, additional usage of staff PPE, and extensive cleaning of exam room while observing appropriate contact time as indicated for disinfecting solutions.   Past Medical History-  Patient Active Problem List   Diagnosis Date Noted  . Delirium 06/25/2020    Priority: High  . Chronic diastolic CHF (congestive heart failure) ([place]) 03/13/2014    Priority: High  . CAD- RCA PCI '90s, STEMI-RCA DES 02/18/14 03/05/2014    Priority: High  . IDDM type 2 03/26/2007    Priority: High  . Anemia, iron deficiency 10/23/2015    Priority: Medium  . Osteoarthritis, knee 05/13/2014    Priority: Medium  . Anxiety state 04/21/2014    Priority: Medium  . SOB (shortness of breath) 03/05/2014    Priority: Medium  . Hyperlipidemia associated with type 2 diabetes mellitus ([place]) 03/26/2007    Priority: Medium  . Hypertension associated with diabetes ([place]) 04/21/2006    Priority: Medium  . COPD (chronic obstructive pulmonary disease) ([place]) 04/21/2006    Priority: Medium  . Actinic keratosis 11/17/2017    Priority: Low  . CKD (chronic kidney disease), stage II 07/25/2014    Priority: Low  . Back pain, lumbosacral 10/10/2012    Priority: Low  . Chest pain 12/14/2014    Medications- reviewed and updated Current Outpatient Medications  Medication Sig Dispense Refill  . acetaminophen (TYLENOL) 325 MG tablet Take 650 mg by mouth every 6 (six) hours as needed for mild pain. Reported on 10/23/2015    . albuterol (PROVENTIL) (2.5 MG/3ML) 0.083% nebulizer solution USE THREE MILLILITERS VIA NEBULIZATION BY MOUTH EVERY 6 HOURS AS NEEDED FOR  WHEEZING OR SHORTNESS OF BREATH (Patient taking differently: Take 2.5 mg by nebulization every 6 (six) hours as needed for wheezing or shortness of breath.) 75 mL 3  . albuterol (VENTOLIN HFA) 108 (90 Base) MCG/ACT inhaler Inhale 1 puff into the lungs every 4 (four) hours as needed for wheezing or shortness of breath (RESCUE inhaler if advair not working). 18 g 5  . Alcohol Swabs (B-D SINGLE USE SWABS REGULAR) PADS USE  FOR  TESTING THREE TIMES DAILY 300 each 1  . amLODipine (NORVASC) 10 MG tablet TAKE 1 TABLET EVERY DAY (Patient taking differently: Take 10 mg by mouth daily.) 90 tablet 3  . aspirin 81 MG tablet Take 81 mg by mouth daily.     Marland Kitchen aspirin-sod bicarb-citric acid (ALKA-SELTZER) 325 MG TBEF tablet Take 325 mg by mouth every 6 (six) hours as needed (stomach ache).    . Blood Glucose Calibration (ACCU-CHEK SMARTVIEW CONTROL) LIQD     . blood glucose meter kit and supplies KIT Dispense based on patient and insurance preference. Use up to four times daily as directed. (FOR ICD-9 250.00, 250.01). 1 each 0  . cholecalciferol (VITAMIN D3) 25 MCG (1000 UNIT) tablet Take 1,000 Units by mouth daily.    . clopidogrel (PLAVIX) 75 MG tablet TAKE ONE TABLET BY MOUTH DAILY **PLEASE KEEP UPCOMING APPOINTMENT IN NOVEMBER WITH DOCTOR [facility] FOR FUTURE REFILLS (Patient taking differently: Take 75 mg by mouth daily.) 90 tablet 1  . enalapril (VASOTEC) 20 MG tablet TAKE 1 TABLET TWICE DAILY (  Patient taking differently: Take 20 mg by mouth 2 (two) times daily.) 180 tablet 3  . fenofibrate micronized (LOFIBRA) 134 MG capsule TAKE ONE CAPSULE BY MOUTH DAILY (Patient taking differently: Take 134 mg by mouth daily before breakfast.) 90 capsule 3  . ferrous sulfate 325 (65 FE) MG tablet Twice a week 18 tablet 3  . fish oil-omega-3 fatty acids 1000 MG capsule Take 2 g by mouth daily.    . Fluticasone-Salmeterol (ADVAIR) 250-50 MCG/DOSE AEPB INHALE ONE PUFF BY MOUTH TWICE DAILY (Patient taking differently: Inhale 1  puff into the lungs in the morning and at bedtime. INHALE ONE PUFF BY MOUTH TWICE DAILY) 60 each 5  . folic acid (FOLVITE) 725 MCG tablet Take 400 mcg by mouth daily.    . furosemide (LASIX) 40 MG tablet Take 1 tablet (40 mg total) by mouth daily. 14 tablet 0  . glucose blood (ACCU-CHEK SMARTVIEW) test strip TEST BLOOD SUGAR THREE TIMES DAILY 300 strip 1  . insulin glargine (LANTUS SOLOSTAR) 100 UNIT/ML Solostar Pen INJECT  17  TO  20 UNITS SUBCUTANEOUSLY EVERY MORNING (Patient taking differently: Inject 18 Units into the skin daily.) 90 mL 3  . Insulin Pen Needle (B-D ULTRAFINE III SHORT PEN) 31G X 8 MM MISC USE AS DIRECTED WITH LANTUS SOLOSTAR dx. e11.9 100 each 3  . KLOR-CON M20 20 MEQ tablet TAKE ONE TABLET BY MOUTH DAILY (Patient taking differently: Take 20 mEq by mouth daily.) 90 tablet 3  . Melatonin 5 MG CAPS Take 1 capsule by mouth at bedtime.     . metFORMIN (GLUCOPHAGE) 1000 MG tablet TAKE 1 TABLET TWICE DAILY (Patient taking differently: Take 1,000 mg by mouth 2 (two) times daily with a meal.) 180 tablet 1  . metoprolol tartrate (LOPRESSOR) 25 MG tablet TAKE 1 TABLET TWICE DAILY (Patient taking differently: Take 25 mg by mouth 2 (two) times daily.) 180 tablet 3  . nitroGLYCERIN (NITROSTAT) 0.4 MG SL tablet PLACE 1 TABLET (0.4 MG TOTAL) UNDER THE TONGUE EVERY 5 (FIVE) MINUTESAS NEEDED FOR CHEST PAIN (CHEST PAIN OR SHORTNESS OF BREATH). (Patient taking differently: Place 0.4 mg under the tongue every 5 (five) minutes as needed for chest pain.) 25 tablet 6  . pantoprazole (PROTONIX) 40 MG tablet Take 1 tablet (40 mg total) by mouth daily. 90 tablet 1  . rosuvastatin (CRESTOR) 40 MG tablet TAKE 1 TABLET EVERY DAY (Patient taking differently: Take 40 mg by mouth daily.) 90 tablet 2  . sertraline (ZOLOFT) 50 MG tablet Take 2 tablets (100 mg total) by mouth at bedtime. 60 tablet 0  . SPIRIVA HANDIHALER 18 MCG inhalation capsule PLACE 1 CAPSULE INTO HANDIHALER AND INHALE CONTENTS OF 1 CAPSULE EVERY  DAY AS DIRECTED AT 2 PM (Patient taking differently: Place 18 mcg into inhaler and inhale daily.) 90 capsule 1  . traMADol (ULTRAM) 50 MG tablet TAKE ONE TABLET BY MOUTH THREE TIMES A DAY AS NEEDED (Patient taking differently: Take 50 mg by mouth 3 (three) times daily as needed for moderate pain.) 90 tablet 5  . vitamin E 400 UNIT capsule Take 400 Units by mouth daily.    Marland Kitchen ACCU-CHEK FASTCLIX LANCETS MISC      No current facility-administered medications for this visit.     Objective:  BP 134/74   Pulse 65   Temp 97.8 F (36.6 C) (Temporal)   Resp 18   Ht $R'5\' 5"'fF$  (1.651 m)   Wt 179 lb (81.2 kg)   SpO2 97%   BMI 29.79 kg/m  Gen: NAD, resting comfortably CV: RRR no murmurs rubs or gallops Lungs: CTAB no crackles, wheeze, rhonchi Ext: no edema Skin: warm, dry Neuro: Wheelchair-bound.  Intermittently confused-for instance at one point thought son [name] was her brother-but with more time she recognized it was her son    Assessment and Plan   # hospital follow up for delirium with new concern for possible underlying dementia S: Patient was hospitalized from June 24, 2020 to June 25, 2020.  She was admitted with delirium (with hallucinations including tactile and speaking with deceased parents )-it was thought that this was related to hypertension with blood pressures in the 190s-patient may have not been taking medication because as soon as home medications were resumed in the hospital blood pressure came down.  Patient seemed more clear.  She was discharged on Toprol 25 mg twice daily, amlodipine 10 mg daily, enalapril 20 mg twice daily, Lasix 40 mg daily.  Blood pressure well controlled at present in office. Sons monitoring blood pressure meds and she has been taking them. Still with some variability such as yesterday up into 160s and even right at 170 but back down this morning under 120.   There was also some concern by hospitalist for age-related dementia-we have not had any  reported changes in memory in the past but signs have begin to notice some changes more recently.  They are not sure she would be able to tolerate discussion about that at this time and requested we discuss at future visit.  She also has been sleeping very poorly on Zoloft 50 mg and melatonin 5 mg at bedtime.  I opted to increase to Zoloft 100 mg to see if that was helpful.  In regards to delirium patient did have short-term chest pain in the hospital but troponin levels were flat.  She was continued on aspirin 81 mg, Plavix 25 mg, fenofibrate, Crestor 40 mg.  Patient's diabetes was well controlled with last A1c 6.6 December 9-continue Lantus 18 units subcutaneous daily as well as Metformin 1000 mg twice daily.  Denies recent hypoglycemia= brings list  Other work-up for delirium included chest x-ray which was reassuring, CT of the head which was reassuring other than some atrophy and MRI of the brain which was reassuring other than some arthritis in the neck though there was some motion on exam  She did have some hypokalemia before leaving the hospital-we will recheck that today.  CBC was normal on discharge.  Respiratory viral panel was normal.  Urinalysis with rare bacteria but was asymptomatic from a urinary perspective.  Ammonia level was minimally elevated at 43 but not considered cause of symptoms  Family reports still with some significant memory issues- asking where mother is who has been gone for several years. Occasionally getting confused with son [name] being brother or mixing up brothers. No issues back in august with this so this has been an acute change. Overall has improved after hospitalization but not resolves.   Sleep has been somewhat better since being home- family not fully aware if causal relationship with how much sleep she gets  Left knee limiting mobility - getting around with walker at home  Referred [place] neurology- pending scheduling appointment  Frequent urination but  no recent change  Discussion with Hulan Fess as did not want to share with mother in room -states last Tuesday they were on phone and she reported confusion stating that her other son was saying he was her son (and she didn't realize that), had  been talking about mother for weeks now, since last week thinking [name] her brother who lives with her is Deidre Ala her brother. Knows son Shanon Brow most of the time.   A/P: Hospital follow-up primarily for delirium and elevated blood pressure  Hypertension associated with diabetes ([place]) In the hospital there was some concern that patient may have confusion related to elevated blood pressures.  Blood pressure has improved in office today but at home is still getting multiple readings in the 160s reading 170s.  We opted to add spironolactone 12.5 mg and stop her home potassium (as long as not low on labs today as it was slightly low in the hospital) with follow-up BMP in 2 days and then recheck in the office in 2 weeks with another BMP -Otherwise continue enalapril 20 mg twice daily, Lasix 40 mg, metoprolol 25 mg twice daily, amlodipine 10 mg in addition to spironolactone addition above at 12.5 mg  Delirium Delirium with unclear etiology versus worsening of baseline potential only prior subacute dementia -there was some concern could be related to blood pressure issues.  Later there was concern she may have underlying dementia which has been unmasked.  Per son there was an acute change starting approximately a week ago where she became very confused but has had intermittent questions about her mother (who is deceased) over the last few months which may indicate some memory changes since that time. -CT and MRI largely reassuring in the hospital -There was also some concern depression/anxiety/poor sleep could contribute-Zoloft was increased to 100 mg and you are already given some more time to see if that is helpful. -Patient also with coronary artery disease on Crestor and  there is some association with memory changes on statins-LDL was excellent on last check at 42-would consider decrease to 20 mg though triglycerides were high-we will add this comment to hyperlipidemia section -Review of chart shows normal TSH -Patient with low normal B12 noted below 220-had recommended taking 100 or 250 mcg/day.  I am going to check with family and see if she has been taking this and likely increase to 1000 mcg a day with a B12 injection on Friday when she comes back for labs - with some urinary frequency check Urine culture (had only seen UA during visit but later noted had normal culture in hospital) -We will check another ammonia level is slightly high but liver issues in the last trending up unlikely to be the source of issues  IDDM type 2 A1c has been well controlled and no hypoglycemia-doubt this is the cause of her memory changes/confusion.  Continue Lantus 18 units daily as well as Metformin 1000 mg twice daily-Metformin may decrease bodies ability to absorb F75  Chronic diastolic CHF (congestive heart failure) Appears euvolemic.  On Lasix 40 mg with potassium 20 mEq daily.  We are going to stop potassium and add spironolactone primarily for blood pressure but may benefit CHF as well  Hyperlipidemia associated with type 2 diabetes mellitus ([place]) -Patient also with coronary artery disease on Crestor and there is some association with memory changes on statins-LDL was excellent on last check at 42-would consider decrease to 20 mg though triglycerides were high-continue fenofibrate 134 mg  Anxiety state Started on Zoloft 25 mg after her MI when she was alone.  Has been titrated to 50 mg and later 100 mg by hospitalist.  We are hoping this will help with anxiety/depression/sleep-recommend moving this to morning time as could affect sleep   #With limited mobility and  left knee pain issues and worsening mobility since getting home from hospital.  Referral to home health for  physical therapy.  She is getting around in her walker at home but knee bothers her-in wheelchair today  #Potential palliative care consult-we discussed this today with son and plan for referral to palliative care if not making progress by next visit   Recommended follow up: 2-day lab visit, 2 weeks in person with me Future Appointments  Date Time Provider [place]  07/03/2020 10:00 AM [facility] LAB [facility]  07/20/2020  2:20 PM [name], MD [facility]  09/10/2020 11:00 AM Marcial Pacas, MD [facility] None  06/07/2021  2:30 PM [facility] HEALTH COACH [facility]   Lab/Order associations:   ICD-10-CM   1. Hypertension associated with diabetes ([place])  N27.61 COMPLETE METABOLIC PANEL WITH GFR   O48.5 COMPLETE METABOLIC PANEL WITH GFR    Basic metabolic panel  2. Resistant hypertension  I10 US Renal Artery Stenosis  3. Confusion  R41.0 Ammonia    Ammonia    CANCELED: Ammonia  4. Polyuria  R35.89 Urine Culture    Urine Culture  5. Delirium  R41.0 Ambulatory referral to [place]. IDDM type 2  E11.9    Z79.4   7. Chronic diastolic CHF (congestive heart failure) ([facility])  I50.32   8. Hyperlipidemia associated with type 2 diabetes mellitus ([facility])  E11.69    E78.5   9. Decreased mobility  R26.89 Ambulatory referral to Home Health  10. Chronic pain of left knee  M25.562 Ambulatory referral to [place]   G89.29   11. Anxiety state  F41.1     Meds ordered this encounter  Medications  . spironolactone (ALDACTONE) 25 MG tablet    Sig: Take 0.5 tablets (12.5 mg total) by mouth daily.    Dispense:  16 tablet    Refill:  5   Time Spent: 56 minutes of total time (8:35 AM-9:31 AM) was spent on the date of the encounter performing the following actions: chart review prior to seeing the patient, obtaining history, performing a medically necessary exam, counseling on the treatment plan, placing orders, and documenting in our EHR.   Return precautions advised.  [name],  MD

## 2020-07-01 ENCOUNTER — Encounter: Payer: Self-pay | Admitting: Family Medicine

## 2020-07-01 ENCOUNTER — Ambulatory Visit (INDEPENDENT_AMBULATORY_CARE_PROVIDER_SITE_OTHER): Payer: Medicare HMO | Admitting: Family Medicine

## 2020-07-01 ENCOUNTER — Other Ambulatory Visit: Payer: Self-pay

## 2020-07-01 ENCOUNTER — Telehealth: Payer: Self-pay | Admitting: Family Medicine

## 2020-07-01 VITALS — BP 134/74 | HR 65 | Temp 97.8°F | Resp 18 | Ht 65.0 in | Wt 179.0 lb

## 2020-07-01 DIAGNOSIS — R2689 Other abnormalities of gait and mobility: Secondary | ICD-10-CM | POA: Diagnosis not present

## 2020-07-01 DIAGNOSIS — I10 Essential (primary) hypertension: Secondary | ICD-10-CM

## 2020-07-01 DIAGNOSIS — R3589 Other polyuria: Secondary | ICD-10-CM

## 2020-07-01 DIAGNOSIS — I152 Hypertension secondary to endocrine disorders: Secondary | ICD-10-CM | POA: Diagnosis not present

## 2020-07-01 DIAGNOSIS — I5032 Chronic diastolic (congestive) heart failure: Secondary | ICD-10-CM | POA: Diagnosis not present

## 2020-07-01 DIAGNOSIS — E1159 Type 2 diabetes mellitus with other circulatory complications: Secondary | ICD-10-CM

## 2020-07-01 DIAGNOSIS — R410 Disorientation, unspecified: Secondary | ICD-10-CM | POA: Diagnosis not present

## 2020-07-01 DIAGNOSIS — E785 Hyperlipidemia, unspecified: Secondary | ICD-10-CM

## 2020-07-01 DIAGNOSIS — Z794 Long term (current) use of insulin: Secondary | ICD-10-CM

## 2020-07-01 DIAGNOSIS — M25562 Pain in left knee: Secondary | ICD-10-CM | POA: Diagnosis not present

## 2020-07-01 DIAGNOSIS — E1169 Type 2 diabetes mellitus with other specified complication: Secondary | ICD-10-CM

## 2020-07-01 DIAGNOSIS — F411 Generalized anxiety disorder: Secondary | ICD-10-CM

## 2020-07-01 DIAGNOSIS — E119 Type 2 diabetes mellitus without complications: Secondary | ICD-10-CM

## 2020-07-01 DIAGNOSIS — G8929 Other chronic pain: Secondary | ICD-10-CM

## 2020-07-01 LAB — AMMONIA: Ammonia: 34 umol/L (ref <=72)

## 2020-07-01 MED ORDER — SPIRONOLACTONE 25 MG PO TABS: 12.5000 mg | ORAL_TABLET | Freq: Every day | ORAL | 5 refills | Status: DC

## 2020-07-01 NOTE — Assessment & Plan Note (Signed)
Appears euvolemic.  On Lasix 40 mg with potassium 20 mEq daily.  We are going to stop potassium and add spironolactone primarily for blood pressure but may benefit CHF as well

## 2020-07-01 NOTE — Assessment & Plan Note (Signed)
-  Patient also with coronary artery disease on Crestor and there is some association with memory changes on statins-LDL was excellent on last check at 42-would consider decrease to 20 mg though triglycerides were high-continue fenofibrate 134 mg

## 2020-07-01 NOTE — Assessment & Plan Note (Signed)
A1c has been well controlled and no hypoglycemia-doubt this is the cause of her memory changes/confusion.  Continue Lantus 18 units daily as well as Metformin 1000 mg twice daily-Metformin may decrease bodies ability to absorb B12

## 2020-07-01 NOTE — Telephone Encounter (Signed)
Reviewed patient's chart and at last visit she did have a low normal B12-low B12 can be a cause of memory changes.  We discussed at that time by phone starting cyanocobalamin/vitamin B12 100 mcg or 250 mcg daily-I forgot to ask if she started this.  I would like to increase her B12 to 1000 mcg a day.  I would also like team to administer a B12 injection of 1000 mcg when she comes in for lab visit on Friday-also schedule her for a nurse visit please.  Another thought I had after discussing with another physician was potentially reducing her Crestor short-term but I want to see how she does with the B12 first.

## 2020-07-01 NOTE — Assessment & Plan Note (Signed)
Started on Zoloft 25 mg after her MI when she was alone.  Has been titrated to 50 mg and later 100 mg by hospitalist.  We are hoping this will help with anxiety/depression/sleep-recommend moving this to morning time as could affect sleep

## 2020-07-01 NOTE — Telephone Encounter (Signed)
Spoke with pt's son, she was NOT taking any form of vitamin b12. She will begin her 1041mcg of b12 today. I did get her nurse visit scheduled for Friday after her labs. He gave a verbal understanding of everything.

## 2020-07-01 NOTE — Telephone Encounter (Signed)
No answer, [facility]

## 2020-07-01 NOTE — Assessment & Plan Note (Signed)
In the hospital there was some concern that patient may have confusion related to elevated blood pressures.  Blood pressure has improved in office today but at home is still getting multiple readings in the 160s reading 170s.  We opted to add spironolactone 12.5 mg and stop her home potassium (as long as not low on labs today as it was slightly low in the hospital) with follow-up BMP in 2 days and then recheck in the office in 2 weeks with another BMP -Otherwise continue enalapril 20 mg twice daily, Lasix 40 mg, metoprolol 25 mg twice daily, amlodipine 10 mg in addition to spironolactone addition above at 12.5 mg

## 2020-07-01 NOTE — Assessment & Plan Note (Addendum)
Delirium with unclear etiology versus worsening of baseline potential only prior subacute dementia -there was some concern could be related to blood pressure issues.  Later there was concern she may have underlying dementia which has been unmasked.  Per son there was an acute change starting approximately a week ago where she became very confused but has had intermittent questions about her mother (who is deceased) over the last few months which may indicate some memory changes since that time. -CT and MRI largely reassuring in the hospital -There was also some concern depression/anxiety/poor sleep could contribute-Zoloft was increased to 100 mg and you are already given some more time to see if that is helpful. -Patient also with coronary artery disease on Crestor and there is some association with memory changes on statins-LDL was excellent on last check at 42-would consider decrease to 20 mg though triglycerides were high-we will add this comment to hyperlipidemia section -Review of chart shows normal TSH -Patient with low normal B12 noted below 220-had recommended taking 100 or 250 mcg/day.  I am going to check with family and see if she has been taking this and likely increase to 1000 mcg a day with a B12 injection on Friday when she comes back for labs - with some urinary frequency check Urine culture (had only seen UA during visit but later noted had normal culture in hospital)

## 2020-07-02 LAB — COMPLETE METABOLIC PANEL WITH GFR
AG Ratio: 1.6 (calc) (ref 1.0–2.5)
ALT: 13 U/L (ref 6–29)
AST: 14 U/L (ref 10–35)
Albumin: 4.4 g/dL (ref 3.6–5.1)
Alkaline phosphatase (APISO): 47 U/L (ref 37–153)
BUN: 24 mg/dL (ref 7–25)
CO2: 27 mmol/L (ref 20–32)
Calcium: 10 mg/dL (ref 8.6–10.4)
Chloride: 102 mmol/L (ref 98–110)
Creat: 0.81 mg/dL (ref 0.60–0.88)
GFR, Est African American: 77 mL/min/{1.73_m2} (ref >=60)
GFR, Est Non African American: 66 mL/min/{1.73_m2} (ref >=60)
Globulin: 2.8 g/dL (calc) (ref 1.9–3.7)
Glucose, Bld: 115 mg/dL — ABNORMAL HIGH (ref 65–99)
Potassium: 4.2 mmol/L (ref 3.5–5.3)
Sodium: 140 mmol/L (ref 135–146)
Total Bilirubin: 0.3 mg/dL (ref 0.2–1.2)
Total Protein: 7.2 g/dL (ref 6.1–8.1)

## 2020-07-02 LAB — URINE CULTURE
MICRO NUMBER:: 11321084
Result:: NO GROWTH
SPECIMEN QUALITY:: ADEQUATE

## 2020-07-03 ENCOUNTER — Other Ambulatory Visit: Payer: Self-pay

## 2020-07-03 ENCOUNTER — Ambulatory Visit: Payer: Medicare HMO

## 2020-07-03 ENCOUNTER — Ambulatory Visit (INDEPENDENT_AMBULATORY_CARE_PROVIDER_SITE_OTHER): Payer: Medicare HMO

## 2020-07-03 ENCOUNTER — Other Ambulatory Visit: Payer: Medicare HMO

## 2020-07-03 DIAGNOSIS — E538 Deficiency of other specified B group vitamins: Secondary | ICD-10-CM

## 2020-07-03 DIAGNOSIS — E1159 Type 2 diabetes mellitus with other circulatory complications: Secondary | ICD-10-CM | POA: Diagnosis not present

## 2020-07-03 DIAGNOSIS — I152 Hypertension secondary to endocrine disorders: Secondary | ICD-10-CM | POA: Diagnosis not present

## 2020-07-03 MED ORDER — CYANOCOBALAMIN 1000 MCG/ML IJ SOLN
1000.0000 ug | Freq: Once | INTRAMUSCULAR | Status: AC
  Administered 2020-07-03: 10:00:00 1000 ug via INTRAMUSCULAR

## 2020-07-04 LAB — BASIC METABOLIC PANEL
BUN/Creatinine Ratio: 24 (calc) — ABNORMAL HIGH (ref 6–22)
BUN: 24 mg/dL (ref 7–25)
CO2: 29 mmol/L (ref 20–32)
Calcium: 10 mg/dL (ref 8.6–10.4)
Chloride: 104 mmol/L (ref 98–110)
Creat: 0.98 mg/dL — ABNORMAL HIGH (ref 0.60–0.88)
Glucose, Bld: 124 mg/dL — ABNORMAL HIGH (ref 65–99)
Potassium: 4.7 mmol/L (ref 3.5–5.3)
Sodium: 141 mmol/L (ref 135–146)

## 2020-07-07 ENCOUNTER — Encounter: Payer: Self-pay | Admitting: Family Medicine

## 2020-07-13 ENCOUNTER — Other Ambulatory Visit: Payer: Self-pay

## 2020-07-13 DIAGNOSIS — I10 Essential (primary) hypertension: Secondary | ICD-10-CM

## 2020-07-16 ENCOUNTER — Other Ambulatory Visit: Payer: Self-pay

## 2020-07-16 ENCOUNTER — Other Ambulatory Visit (INDEPENDENT_AMBULATORY_CARE_PROVIDER_SITE_OTHER): Payer: Medicare HMO

## 2020-07-16 DIAGNOSIS — I10 Essential (primary) hypertension: Secondary | ICD-10-CM

## 2020-07-16 LAB — BASIC METABOLIC PANEL
BUN: 20 mg/dL (ref 6–23)
CO2: 30 mEq/L (ref 19–32)
Calcium: 9.1 mg/dL (ref 8.4–10.5)
Chloride: 101 mEq/L (ref 96–112)
Creatinine, Ser: 0.84 mg/dL (ref 0.40–1.20)
GFR: 63.31 mL/min (ref >60.00)
Glucose, Bld: 131 mg/dL — ABNORMAL HIGH (ref 70–99)
Potassium: 3.5 mEq/L (ref 3.5–5.1)
Sodium: 139 mEq/L (ref 135–145)

## 2020-07-18 NOTE — Progress Notes (Signed)
Phone [phone] Virtual visit via phonenote   Subjective:   Chief Complaint  Patient presents with  . Hypertension  . Vitamin B12 deficiency   This visit type was conducted due to national recommendations for restrictions regarding the COVID-19 Pandemic (e.g. social distancing).  This format is felt to be most appropriate for this patient at this time balancing risks to patient and risks to population by having him in for in person visit.  All issues noted in this document were discussed and addressed.  No physical exam was performed (except for noted visual exam or audio findings with Telehealth visits).  The patient has consented to conduct a Telehealth visit and understands insurance will be billed.   Our team/I connected with [name] at  2:20 PM EST by phone (patient did not have equipment for webex) and verified that I am speaking with the correct person using two identifiers.  Location patient: [place] Location provider: [facility], office Persons participating in the virtual visit:  Patient, son randy  Time on phone: 15 minutes Counseling provided about blood pressure goals, reason for follow up  Our team/I discussed the limitations of evaluation and management by telemedicine and the availability of in person appointments. In light of current covid-19 pandemic, patient also understands that we are trying to protect them by minimizing in office contact if at all possible.  The patient expressed consent for telemedicine visit and agreed to proceed. Patient understands insurance will be billed.   Past Medical History-  Patient Active Problem List   Diagnosis Date Noted  . Delirium 06/25/2020    Priority: High  . Chronic diastolic CHF (congestive heart failure) ([place]) 03/13/2014    Priority: High  . CAD- RCA PCI '90s, STEMI-RCA DES 02/18/14 03/05/2014    Priority: High  . IDDM type 2 03/26/2007    Priority: High  . Anemia, iron deficiency 10/23/2015    Priority:  Medium  . Osteoarthritis, knee 05/13/2014    Priority: Medium  . Anxiety state 04/21/2014    Priority: Medium  . SOB (shortness of breath) 03/05/2014    Priority: Medium  . Hyperlipidemia associated with type 2 diabetes mellitus ([place]) 03/26/2007    Priority: Medium  . Hypertension associated with diabetes ([place]) 04/21/2006    Priority: Medium  . COPD (chronic obstructive pulmonary disease) ([place]) 04/21/2006    Priority: Medium  . Actinic keratosis 11/17/2017    Priority: Low  . CKD (chronic kidney disease), stage II 07/25/2014    Priority: Low  . Back pain, lumbosacral 10/10/2012    Priority: Low  . Chest pain 12/14/2014    Medications- reviewed and updated Current Outpatient Medications  Medication Sig Dispense Refill  . ACCU-CHEK FASTCLIX LANCETS MISC     . acetaminophen (TYLENOL) 500 MG tablet Take 500 mg by mouth every 6 (six) hours as needed.    Marland Kitchen albuterol (PROVENTIL) (2.5 MG/3ML) 0.083% nebulizer solution USE THREE MILLILITERS VIA NEBULIZATION BY MOUTH EVERY 6 HOURS AS NEEDED FOR WHEEZING OR SHORTNESS OF BREATH (Patient taking differently: Take 2.5 mg by nebulization every 6 (six) hours as needed for wheezing or shortness of breath.) 75 mL 3  . albuterol (VENTOLIN HFA) 108 (90 Base) MCG/ACT inhaler Inhale 1 puff into the lungs every 4 (four) hours as needed for wheezing or shortness of breath (RESCUE inhaler if advair not working). 18 g 5  . Alcohol Swabs (B-D SINGLE USE SWABS REGULAR) PADS USE  FOR  TESTING THREE TIMES DAILY 300 each 1  . amLODipine (  NORVASC) 10 MG tablet TAKE 1 TABLET EVERY DAY (Patient taking differently: Take 10 mg by mouth daily.) 90 tablet 3  . aspirin 81 MG tablet Take 81 mg by mouth daily.     . Blood Glucose Calibration (ACCU-CHEK SMARTVIEW CONTROL) LIQD     . blood glucose meter kit and supplies KIT Dispense based on patient and insurance preference. Use up to four times daily as directed. (FOR ICD-9 250.00, 250.01). 1 each 0  . cholecalciferol  (VITAMIN D3) 25 MCG (1000 UNIT) tablet Take 1,000 Units by mouth daily.    . clopidogrel (PLAVIX) 75 MG tablet TAKE ONE TABLET BY MOUTH DAILY **PLEASE KEEP UPCOMING APPOINTMENT IN NOVEMBER WITH DOCTOR [facility] FOR FUTURE REFILLS (Patient taking differently: Take 75 mg by mouth daily.) 90 tablet 1  . enalapril (VASOTEC) 20 MG tablet TAKE 1 TABLET TWICE DAILY (Patient taking differently: Take 20 mg by mouth 2 (two) times daily.) 180 tablet 3  . fenofibrate micronized (LOFIBRA) 134 MG capsule TAKE ONE CAPSULE BY MOUTH DAILY (Patient taking differently: Take 134 mg by mouth daily before breakfast.) 90 capsule 3  . ferrous sulfate 325 (65 FE) MG tablet Twice a week 18 tablet 3  . fish oil-omega-3 fatty acids 1000 MG capsule Take 2 g by mouth daily.    . Fluticasone-Salmeterol (ADVAIR) 250-50 MCG/DOSE AEPB INHALE ONE PUFF BY MOUTH TWICE DAILY (Patient taking differently: Inhale 1 puff into the lungs in the morning and at bedtime. INHALE ONE PUFF BY MOUTH TWICE DAILY) 60 each 5  . folic acid (FOLVITE) 253 MCG tablet Take 400 mcg by mouth daily.    . furosemide (LASIX) 40 MG tablet Take 1 tablet (40 mg total) by mouth daily. 14 tablet 0  . glucose blood (ACCU-CHEK SMARTVIEW) test strip TEST BLOOD SUGAR THREE TIMES DAILY 300 strip 1  . insulin glargine (LANTUS SOLOSTAR) 100 UNIT/ML Solostar Pen INJECT  17  TO  20 UNITS SUBCUTANEOUSLY EVERY MORNING (Patient taking differently: Inject 18 Units into the skin daily.) 90 mL 3  . Insulin Pen Needle (B-D ULTRAFINE III SHORT PEN) 31G X 8 MM MISC USE AS DIRECTED WITH LANTUS SOLOSTAR dx. e11.9 100 each 3  . Melatonin 5 MG CAPS Take 1 capsule by mouth at bedtime.     . metFORMIN (GLUCOPHAGE) 1000 MG tablet TAKE 1 TABLET TWICE DAILY (Patient taking differently: Take 1,000 mg by mouth 2 (two) times daily with a meal.) 180 tablet 1  . metoprolol tartrate (LOPRESSOR) 25 MG tablet TAKE 1 TABLET TWICE DAILY (Patient taking differently: Take 25 mg by mouth 2 (two) times daily.)  180 tablet 3  . nitroGLYCERIN (NITROSTAT) 0.4 MG SL tablet PLACE 1 TABLET (0.4 MG TOTAL) UNDER THE TONGUE EVERY 5 (FIVE) MINUTESAS NEEDED FOR CHEST PAIN (CHEST PAIN OR SHORTNESS OF BREATH). (Patient taking differently: Place 0.4 mg under the tongue every 5 (five) minutes as needed for chest pain.) 25 tablet 6  . pantoprazole (PROTONIX) 40 MG tablet Take 1 tablet (40 mg total) by mouth daily. 90 tablet 1  . rosuvastatin (CRESTOR) 40 MG tablet TAKE 1 TABLET EVERY DAY (Patient taking differently: Take 40 mg by mouth daily.) 90 tablet 2  . sertraline (ZOLOFT) 50 MG tablet Take 2 tablets (100 mg total) by mouth at bedtime. 60 tablet 0  . SPIRIVA HANDIHALER 18 MCG inhalation capsule PLACE 1 CAPSULE INTO HANDIHALER AND INHALE CONTENTS OF 1 CAPSULE EVERY DAY AS DIRECTED AT 2 PM (Patient taking differently: Place 18 mcg into inhaler and inhale daily.)  90 capsule 1  . spironolactone (ALDACTONE) 25 MG tablet Take 0.5 tablets (12.5 mg total) by mouth daily. 16 tablet 5  . traMADol (ULTRAM) 50 MG tablet TAKE ONE TABLET BY MOUTH THREE TIMES A DAY AS NEEDED (Patient taking differently: Take 50 mg by mouth 3 (three) times daily as needed for moderate pain.) 90 tablet 5  . vitamin E 400 UNIT capsule Take 400 Units by mouth daily.    Marland Kitchen acetaminophen (TYLENOL) 325 MG tablet Take 650 mg by mouth every 6 (six) hours as needed for mild pain. Reported on 10/23/2015 (Patient not taking: Reported on 07/20/2020)    . aspirin-sod bicarb-citric acid (ALKA-SELTZER) 325 MG TBEF tablet Take 325 mg by mouth every 6 (six) hours as needed (stomach ache). (Patient not taking: Reported on 07/20/2020)     No current facility-administered medications for this visit.     Objective:  BP 132/65   Ht 5' 5" (1.651 m)   Wt 179 lb 0.2 oz (81.2 kg)   BMI 29.79 kg/m  self reported vitals  Nonlabored voice, normal speech      Assessment and Plan   #hypertension #CHF S: medication: spironolactone 12.92m (added last visit), amlodipine 10 mg,  enalapril 259mBID, lasix 40 mg, metoprolol 25 mg BID Home readings #s:  checking 3x a day- 2 usually normal and then 1 usually high. Average probably close to or just under 140/90. Average over 3 readings on new years eve eve 12/30 was 145/77.   Weight 182 on home scales largely stable BP Readings from Last 3 Encounters:  07/20/20 132/65  07/01/20 134/74  06/25/20 (!) 170/96  A/P: CHF sounds stable-reports stable swelling and weights at home.  Blood pressure well controlled on reading today but average sounds slightly high such as the 145/77 listed above-before making changes in medication I really need to check her potassium.  She was unable to come in today due to the snow.  She and her son agreed to schedule a 2-week follow-up so we can recheck blood pressure and check potassium-if readings still running high at home or in office would consider spironolactone 25 mg. -Also hoping renal artery duplex completed by next visit  # Memory changes S: memory issues are much improved. Still forgetting son is RaLouie Casarom time to time - seems to be perhaps 50% of issues. Occasionally thinks parents still alive  Other things she is much sharper on such as short term memory.   Depression meds increased to 10027moloft in hospital- she reports depressed symptoms improved.   taking b12 consistently now A/P: Memory issues improving-[place]-possible dementia  # Diabetes S: Medication: Metformin 1000 mg twice daily, Lantus 18 units daily CBGs- 100-120 in am for most part. NYD 134.  Lab Results  Component Value Date   HGBA1C 6.6 (H) 06/25/2020   HGBA1C 6.6 (H) 02/18/2020   HGBA1C 6.6 (H) 11/21/2019   A/P: Reasonable control-continue current medication  Recommended follow up: 2-week follow-up recommended Future Appointments  Date Time Provider [place]/24/2022 11:00 AM YanMarcial PacasD [facility] None  06/07/2021  2:30 PM [facility]  HEALTH COACH [facility]    Lab/Order associations:   ICD-10-CM   1. Hypertension associated with diabetes ([place]11.59    I15.2   2. IDDM type 2  E11.9    Z79.4   3. Chronic diastolic CHF (congestive heart failure) (H[facility])  I50.32    Return precautions advised.  SteGarret Reddish  MD

## 2020-07-18 NOTE — Patient Instructions (Signed)
Health Maintenance Due  Topic Date Due  . OPHTHALMOLOGY EXAM  04/28/2018  . COVID-19 Vaccine (3 - Moderna risk 4-dose series) 12/20/2019  . FOOT EXAM  02/22/2020   Depression screen [facility] 2/9 05/25/2020 02/18/2020 11/21/2019  Decreased Interest 0 0 0  Down, Depressed, Hopeless 0 0 0  PHQ - 2 Score 0 0 0  Altered sleeping - 0 3  Tired, decreased energy - 3 2  Change in appetite - 0 0  Feeling bad or failure about yourself  - 0 2  Trouble concentrating - 0 0  Moving slowly or fidgety/restless - 0 0  Suicidal thoughts - 0 0  PHQ-9 Score - 3 7  Difficult doing work/chores - Not difficult at all Somewhat difficult  Some recent data might be hidden

## 2020-07-20 ENCOUNTER — Other Ambulatory Visit: Payer: Self-pay

## 2020-07-20 ENCOUNTER — Telehealth (INDEPENDENT_AMBULATORY_CARE_PROVIDER_SITE_OTHER): Payer: Medicare HMO | Admitting: Family Medicine

## 2020-07-20 ENCOUNTER — Encounter: Payer: Self-pay | Admitting: Family Medicine

## 2020-07-20 VITALS — BP 132/65 | Ht 65.0 in | Wt 179.0 lb

## 2020-07-20 DIAGNOSIS — E119 Type 2 diabetes mellitus without complications: Secondary | ICD-10-CM

## 2020-07-20 DIAGNOSIS — Z794 Long term (current) use of insulin: Secondary | ICD-10-CM | POA: Diagnosis not present

## 2020-07-20 DIAGNOSIS — I5032 Chronic diastolic (congestive) heart failure: Secondary | ICD-10-CM | POA: Diagnosis not present

## 2020-07-20 DIAGNOSIS — I152 Hypertension secondary to endocrine disorders: Secondary | ICD-10-CM

## 2020-07-20 DIAGNOSIS — E1159 Type 2 diabetes mellitus with other circulatory complications: Secondary | ICD-10-CM

## 2020-07-23 NOTE — Addendum Note (Signed)
Addended by: Shelva Majestic on: 07/23/2020 09:24 AM   Modules accepted: Orders

## 2020-07-28 NOTE — Patient Instructions (Addendum)
Diabetes Health Maintenance Due  Topic Date Due  . OPHTHALMOLOGY EXAM Needs appointment. Please call to schedule this 04/28/2018   Please stop by lab before you go If you have mychart- we will send your results within 3 business days of [place] receiving them.  If you do not have mychart- we will call you about results within 5 business days of [place] receiving them.  *please also note that you will see labs on mychart as soon as they post. I will later go in and write notes on them- will say "notes from Dr. Yong Channel"  2-3 week recheck  Home blood pressure cuff looks pretty accurate.  Home blood pressures are running too high- I am likely going to increase her spironolactone to 25 mg daily instead of 12.5 mg daily but I need to make sure your potassium is okay with blood work above.  We will call you within two weeks about your referral to advanced hypertension clinic. If you do not hear within 2 weeks, give [place] a call.

## 2020-07-28 NOTE — Progress Notes (Signed)
Phone [phone] In person visit   Subjective:   Michelle [name] is a 87 y.o. year old very pleasant female patient who presents for/with See problem oriented charting Chief Complaint  Patient presents with  . Hypertension  . Kidney Function   This visit occurred during the SARS-CoV-2 public health emergency.  Safety protocols were in place, including screening questions prior to the visit, additional usage of staff PPE, and extensive cleaning of exam room while observing appropriate contact time as indicated for disinfecting solutions.   Past Medical History-  Patient Active Problem List   Diagnosis Date Noted  . Delirium 06/25/2020    Priority: High  . Chronic diastolic CHF (congestive heart failure) ([place]) 03/13/2014    Priority: High  . CAD- RCA PCI '90s, STEMI-RCA DES 02/18/14 03/05/2014    Priority: High  . IDDM type 2 03/26/2007    Priority: High  . Anemia, iron deficiency 10/23/2015    Priority: Medium  . Osteoarthritis, knee 05/13/2014    Priority: Medium  . Anxiety state 04/21/2014    Priority: Medium  . SOB (shortness of breath) 03/05/2014    Priority: Medium  . Hyperlipidemia associated with type 2 diabetes mellitus ([place]) 03/26/2007    Priority: Medium  . Hypertension associated with diabetes ([place]) 04/21/2006    Priority: Medium  . COPD (chronic obstructive pulmonary disease) ([place]) 04/21/2006    Priority: Medium  . Actinic keratosis 11/17/2017    Priority: Low  . CKD (chronic kidney disease), stage II 07/25/2014    Priority: Low  . Back pain, lumbosacral 10/10/2012    Priority: Low  . Chest pain 12/14/2014    Medications- reviewed and updated Current Outpatient Medications  Medication Sig Dispense Refill  . ACCU-CHEK FASTCLIX LANCETS MISC     . acetaminophen (TYLENOL) 500 MG tablet Take 500 mg by mouth every 6 (six) hours as needed.    Marland Kitchen albuterol (PROVENTIL) (2.5 MG/3ML) 0.083% nebulizer solution USE THREE MILLILITERS VIA NEBULIZATION BY MOUTH EVERY 6  HOURS AS NEEDED FOR WHEEZING OR SHORTNESS OF BREATH (Patient taking differently: Take 2.5 mg by nebulization every 6 (six) hours as needed for wheezing or shortness of breath.) 75 mL 3  . albuterol (VENTOLIN HFA) 108 (90 Base) MCG/ACT inhaler Inhale 1 puff into the lungs every 4 (four) hours as needed for wheezing or shortness of breath (RESCUE inhaler if advair not working). 18 g 5  . Alcohol Swabs (B-D SINGLE USE SWABS REGULAR) PADS USE  FOR  TESTING THREE TIMES DAILY 300 each 1  . amLODipine (NORVASC) 10 MG tablet TAKE 1 TABLET EVERY DAY (Patient taking differently: Take 10 mg by mouth daily.) 90 tablet 3  . aspirin 81 MG tablet Take 81 mg by mouth daily.     Marland Kitchen aspirin-sod bicarb-citric acid (ALKA-SELTZER) 325 MG TBEF tablet Take 325 mg by mouth every 6 (six) hours as needed (stomach ache).    . Blood Glucose Calibration (ACCU-CHEK SMARTVIEW CONTROL) LIQD     . blood glucose meter kit and supplies KIT Dispense based on patient and insurance preference. Use up to four times daily as directed. (FOR ICD-9 250.00, 250.01). 1 each 0  . cholecalciferol (VITAMIN D3) 25 MCG (1000 UNIT) tablet Take 1,000 Units by mouth daily.    . clopidogrel (PLAVIX) 75 MG tablet TAKE ONE TABLET BY MOUTH DAILY **PLEASE KEEP UPCOMING APPOINTMENT IN NOVEMBER WITH DOCTOR [facility] FOR FUTURE REFILLS (Patient taking differently: Take 75 mg by mouth daily.) 90 tablet 1  . enalapril (VASOTEC) 20 MG  tablet TAKE 1 TABLET TWICE DAILY (Patient taking differently: Take 20 mg by mouth 2 (two) times daily.) 180 tablet 3  . fenofibrate micronized (LOFIBRA) 134 MG capsule TAKE ONE CAPSULE BY MOUTH DAILY (Patient taking differently: Take 134 mg by mouth daily before breakfast.) 90 capsule 3  . ferrous sulfate 325 (65 FE) MG tablet Twice a week 18 tablet 3  . fish oil-omega-3 fatty acids 1000 MG capsule Take 2 g by mouth daily.    . Fluticasone-Salmeterol (ADVAIR) 250-50 MCG/DOSE AEPB INHALE ONE PUFF BY MOUTH TWICE DAILY (Patient taking  differently: Inhale 1 puff into the lungs in the morning and at bedtime. INHALE ONE PUFF BY MOUTH TWICE DAILY) 60 each 5  . folic acid (FOLVITE) 196 MCG tablet Take 400 mcg by mouth daily.    . furosemide (LASIX) 40 MG tablet Take 1 tablet (40 mg total) by mouth daily. 14 tablet 0  . glucose blood (ACCU-CHEK SMARTVIEW) test strip TEST BLOOD SUGAR THREE TIMES DAILY 300 strip 1  . insulin glargine (LANTUS SOLOSTAR) 100 UNIT/ML Solostar Pen INJECT  17  TO  20 UNITS SUBCUTANEOUSLY EVERY MORNING (Patient taking differently: Inject 18 Units into the skin daily.) 90 mL 3  . Insulin Pen Needle (B-D ULTRAFINE III SHORT PEN) 31G X 8 MM MISC USE AS DIRECTED WITH LANTUS SOLOSTAR dx. e11.9 100 each 3  . Melatonin 5 MG CAPS Take 1 capsule by mouth at bedtime.     . metFORMIN (GLUCOPHAGE) 1000 MG tablet TAKE 1 TABLET TWICE DAILY (Patient taking differently: Take 1,000 mg by mouth 2 (two) times daily with a meal.) 180 tablet 1  . metoprolol tartrate (LOPRESSOR) 25 MG tablet TAKE 1 TABLET TWICE DAILY (Patient taking differently: Take 25 mg by mouth 2 (two) times daily.) 180 tablet 3  . nitroGLYCERIN (NITROSTAT) 0.4 MG SL tablet PLACE 1 TABLET (0.4 MG TOTAL) UNDER THE TONGUE EVERY 5 (FIVE) MINUTESAS NEEDED FOR CHEST PAIN (CHEST PAIN OR SHORTNESS OF BREATH). (Patient taking differently: Place 0.4 mg under the tongue every 5 (five) minutes as needed for chest pain.) 25 tablet 6  . pantoprazole (PROTONIX) 40 MG tablet Take 1 tablet (40 mg total) by mouth daily. 90 tablet 1  . rosuvastatin (CRESTOR) 40 MG tablet TAKE 1 TABLET EVERY DAY (Patient taking differently: Take 40 mg by mouth daily.) 90 tablet 2  . sertraline (ZOLOFT) 50 MG tablet Take 2 tablets (100 mg total) by mouth at bedtime. 60 tablet 0  . SPIRIVA HANDIHALER 18 MCG inhalation capsule PLACE 1 CAPSULE INTO HANDIHALER AND INHALE CONTENTS OF 1 CAPSULE EVERY DAY AS DIRECTED AT 2 PM (Patient taking differently: Place 18 mcg into inhaler and inhale daily.) 90 capsule  1  . traMADol (ULTRAM) 50 MG tablet TAKE ONE TABLET BY MOUTH THREE TIMES A DAY AS NEEDED (Patient taking differently: Take 50 mg by mouth 3 (three) times daily as needed for moderate pain.) 90 tablet 5  . vitamin E 400 UNIT capsule Take 400 Units by mouth daily.    Marland Kitchen spironolactone (ALDACTONE) 25 MG tablet Take 0.5 tablets (12.5 mg total) by mouth daily. 31 tablet 5   No current facility-administered medications for this visit.     Objective:  BP (!) 154/76   Pulse 71   Temp 98.4 F (36.9 C) (Temporal)   Ht 5' 5" (1.651 m)   SpO2 95%   BMI 29.79 kg/m  Gen: NAD, resting comfortably CV: RRR no murmurs rubs or gallops Lungs: CTAB no crackles, wheeze, rhonchi Ext:  no edema Skin: warm, dry     Assessment and Plan   #hypertension S: medication: Spironolactone 12.5Mg, metoprolol 25Mg BID, enalapril 20Mg BID, amlodipine 10Mg, Lasix 40 mg. Family verifies she is not misisng meds.  Home readings #s: high variation anywhere from 124/83 to 180//85 in the last week Home average over last 14 readings  152.5 [phone]   BP Readings from Last 3 Encounters:  07/30/20 (!) 154/76 compared to 156/78  07/20/20 132/65  07/01/20 134/74  A/P: hypertension remains poorly controlled. We will increase to spironolactone 25 mg as long as potassium ok todya - renal artery duplex was normal -already watching salt in diet -will refer to advanced hypertension clinic - 2-3 week recheck - reached out to primary cardiologist as well - he is fine with referral to advanced hypertension clinic  #Memory changes S: Patient has follow-up with Dr. Krista Blue in February.  Initially memory issues were thought related to delirium but have persisted- a lot of this focuses around her son Randy-at least 50% of issues-patient still thinks her parents are alive at times. Since last visit though has improved substantially per son though A/P: glad patient's mentation is clearing- will keep follow up with Dr. Krista Blue   #  Diabetes S: Medication:metformin 1072m BID, lantus 18 units daily  CBGs- 100-120 for most part- a few into 90s Lab Results  Component Value Date   HGBA1C 6.6 (H) 06/25/2020   HGBA1C 6.6 (H) 02/18/2020   HGBA1C 6.6 (H) 11/21/2019    A/P: good control- we discussed if cbg gets below 80 could reduce lantus to 16 units and update me.    Recommended follow up: No follow-ups on file. Future Appointments  Date Time Provider [place] 09/10/2020 11:00 AM YMarcial Pacas MD [facility] None  06/07/2021  2:30 PM [facility] HEALTH COACH [facility]   Lab/Order associations:   ICD-10-CM   1. Hypertension associated with diabetes ([place]  EK59.93Basic metabolic panel   IT70.1[place]ulatory referral [place] [facility] [place] 2. Resistant hypertension  I10 Ambulatory referral to [facility] Hypertension Clinic - [place] 3. Memory changes  R41.3     Meds ordered this encounter  Medications  . DISCONTD: spironolactone (ALDACTONE) 25 MG tablet    Sig: Take 0.5 tablets (12.5 mg total) by mouth daily.    Dispense:  31 tablet    Refill:  5  . spironolactone (ALDACTONE) 25 MG tablet    Sig: Take 1 tablet (25 mg total) by mouth daily.    Dispense:  31 tablet    Refill:  5    Return precautions advised.  SGarret Reddish MD

## 2020-07-29 ENCOUNTER — Ambulatory Visit (HOSPITAL_COMMUNITY)
Admission: RE | Admit: 2020-07-29 | Discharge: 2020-07-29 | Disposition: A | Discharge status: Home / Self Care | Payer: Medicare HMO | Source: Ambulatory Visit | Attending: Family Medicine | Admitting: Family Medicine

## 2020-07-29 ENCOUNTER — Other Ambulatory Visit: Payer: Self-pay

## 2020-07-29 DIAGNOSIS — I10 Essential (primary) hypertension: Secondary | ICD-10-CM | POA: Diagnosis not present

## 2020-07-30 ENCOUNTER — Encounter: Payer: Self-pay | Admitting: Family Medicine

## 2020-07-30 ENCOUNTER — Ambulatory Visit (INDEPENDENT_AMBULATORY_CARE_PROVIDER_SITE_OTHER): Payer: Medicare HMO | Admitting: Family Medicine

## 2020-07-30 VITALS — BP 154/76 | HR 71 | Temp 98.4°F | Ht 65.0 in

## 2020-07-30 DIAGNOSIS — Z794 Long term (current) use of insulin: Secondary | ICD-10-CM | POA: Diagnosis not present

## 2020-07-30 DIAGNOSIS — I10 Essential (primary) hypertension: Secondary | ICD-10-CM | POA: Diagnosis not present

## 2020-07-30 DIAGNOSIS — E119 Type 2 diabetes mellitus without complications: Secondary | ICD-10-CM

## 2020-07-30 DIAGNOSIS — E1159 Type 2 diabetes mellitus with other circulatory complications: Secondary | ICD-10-CM | POA: Diagnosis not present

## 2020-07-30 DIAGNOSIS — I152 Hypertension secondary to endocrine disorders: Secondary | ICD-10-CM | POA: Diagnosis not present

## 2020-07-30 DIAGNOSIS — R413 Other amnesia: Secondary | ICD-10-CM | POA: Diagnosis not present

## 2020-07-30 DIAGNOSIS — N182 Chronic kidney disease, stage 2 (mild): Secondary | ICD-10-CM

## 2020-07-30 LAB — BASIC METABOLIC PANEL
BUN: 18 mg/dL (ref 6–23)
CO2: 30 mEq/L (ref 19–32)
Calcium: 9.7 mg/dL (ref 8.4–10.5)
Chloride: 101 mEq/L (ref 96–112)
Creatinine, Ser: 0.76 mg/dL (ref 0.40–1.20)
GFR: 71.37 mL/min (ref >60.00)
Glucose, Bld: 150 mg/dL — ABNORMAL HIGH (ref 70–99)
Potassium: 3.1 mEq/L — ABNORMAL LOW (ref 3.5–5.1)
Sodium: 138 mEq/L (ref 135–145)

## 2020-07-30 MED ORDER — SPIRONOLACTONE 25 MG PO TABS
12.5000 mg | ORAL_TABLET | Freq: Every day | ORAL | 5 refills | Status: DC
Start: 2020-07-30 — End: 2020-07-30

## 2020-07-30 MED ORDER — SPIRONOLACTONE 25 MG PO TABS
25.0000 mg | ORAL_TABLET | Freq: Every day | ORAL | 5 refills | Status: DC
Start: 2020-07-30 — End: 2020-09-04

## 2020-08-03 ENCOUNTER — Other Ambulatory Visit: Payer: Self-pay | Admitting: Family Medicine

## 2020-08-04 ENCOUNTER — Telehealth: Payer: Self-pay

## 2020-08-04 NOTE — Chronic Care Management (AMB) (Signed)
Chronic Care Management Pharmacy Assistant   Name: Michelle [name]  MRN: [id number] DOB: 21-Dec-1932  Reason for Encounter: Disease State/Diabetes Adherence Call  PCP : [name], MD  Allergies:   Allergies  Allergen Reactions  . Codeine Sulfate Nausea Only  . Morphine Sulfate Nausea Only    Medications: Outpatient Encounter Medications as of 08/04/2020  Medication Sig Note  . ACCU-CHEK FASTCLIX LANCETS MISC    . acetaminophen (TYLENOL) 500 MG tablet Take 500 mg by mouth every 6 (six) hours as needed.   Marland Kitchen albuterol (PROVENTIL) (2.5 MG/3ML) 0.083% nebulizer solution USE THREE MILLILITERS VIA NEBULIZATION BY MOUTH EVERY 6 HOURS AS NEEDED FOR WHEEZING OR SHORTNESS OF BREATH (Patient taking differently: Take 2.5 mg by nebulization every 6 (six) hours as needed for wheezing or shortness of breath.)   . albuterol (VENTOLIN HFA) 108 (90 Base) MCG/ACT inhaler Inhale 1 puff into the lungs every 4 (four) hours as needed for wheezing or shortness of breath (RESCUE inhaler if advair not working).   . Alcohol Swabs (B-D SINGLE USE SWABS REGULAR) PADS USE  FOR  TESTING THREE TIMES DAILY   . amLODipine (NORVASC) 10 MG tablet TAKE 1 TABLET EVERY DAY (Patient taking differently: Take 10 mg by mouth daily.)   . aspirin 81 MG tablet Take 81 mg by mouth daily.  12/14/2014: .  . aspirin-sod bicarb-citric acid (ALKA-SELTZER) 325 MG TBEF tablet Take 325 mg by mouth every 6 (six) hours as needed (stomach ache).   . Blood Glucose Calibration (ACCU-CHEK SMARTVIEW CONTROL) LIQD    . blood glucose meter kit and supplies KIT Dispense based on patient and insurance preference. Use up to four times daily as directed. (FOR ICD-9 250.00, 250.01).   . cholecalciferol (VITAMIN D3) 25 MCG (1000 UNIT) tablet Take 1,000 Units by mouth daily.   . clopidogrel (PLAVIX) 75 MG tablet TAKE ONE TABLET BY MOUTH DAILY **PLEASE KEEP UPCOMING APPOINTMENT IN NOVEMBER WITH DOCTOR [facility] FOR FUTURE REFILLS (Patient taking  differently: Take 75 mg by mouth daily.)   . DROPLET PEN NEEDLES 31G X 8 MM MISC USE AS DIRECTED WITH LANTUS SOLOSTAR   . enalapril (VASOTEC) 20 MG tablet TAKE 1 TABLET TWICE DAILY (Patient taking differently: Take 20 mg by mouth 2 (two) times daily.)   . fenofibrate micronized (LOFIBRA) 134 MG capsule TAKE ONE CAPSULE BY MOUTH DAILY (Patient taking differently: Take 134 mg by mouth daily before breakfast.)   . ferrous sulfate 325 (65 FE) MG tablet Twice a week   . fish oil-omega-3 fatty acids 1000 MG capsule Take 2 g by mouth daily.   . Fluticasone-Salmeterol (ADVAIR) 250-50 MCG/DOSE AEPB INHALE ONE PUFF BY MOUTH TWICE DAILY (Patient taking differently: Inhale 1 puff into the lungs in the morning and at bedtime. INHALE ONE PUFF BY MOUTH TWICE DAILY)   . folic acid (FOLVITE) 536 MCG tablet Take 400 mcg by mouth daily.   . furosemide (LASIX) 40 MG tablet Take 1 tablet (40 mg total) by mouth daily.   Marland Kitchen glucose blood (ACCU-CHEK SMARTVIEW) test strip TEST BLOOD SUGAR THREE TIMES DAILY   . insulin glargine (LANTUS SOLOSTAR) 100 UNIT/ML Solostar Pen INJECT  17  TO  20 UNITS SUBCUTANEOUSLY EVERY MORNING (Patient taking differently: Inject 18 Units into the skin daily.)   . Melatonin 5 MG CAPS Take 1 capsule by mouth at bedtime.    . metFORMIN (GLUCOPHAGE) 1000 MG tablet TAKE 1 TABLET TWICE DAILY (Patient taking differently: Take 1,000 mg by mouth 2 (two)  times daily with a meal.)   . metoprolol tartrate (LOPRESSOR) 25 MG tablet TAKE 1 TABLET TWICE DAILY (Patient taking differently: Take 25 mg by mouth 2 (two) times daily.)   . nitroGLYCERIN (NITROSTAT) 0.4 MG SL tablet PLACE 1 TABLET (0.4 MG TOTAL) UNDER THE TONGUE EVERY 5 (FIVE) MINUTESAS NEEDED FOR CHEST PAIN (CHEST PAIN OR SHORTNESS OF BREATH). (Patient taking differently: Place 0.4 mg under the tongue every 5 (five) minutes as needed for chest pain.)   . pantoprazole (PROTONIX) 40 MG tablet Take 1 tablet (40 mg total) by mouth daily.   . rosuvastatin  (CRESTOR) 40 MG tablet TAKE 1 TABLET EVERY DAY (Patient taking differently: Take 40 mg by mouth daily.)   . sertraline (ZOLOFT) 50 MG tablet Take 2 tablets (100 mg total) by mouth at bedtime.   Marland Kitchen SPIRIVA HANDIHALER 18 MCG inhalation capsule PLACE 1 CAPSULE INTO HANDIHALER AND INHALE CONTENTS OF 1 CAPSULE EVERY DAY AS DIRECTED AT 2 PM (Patient taking differently: Place 18 mcg into inhaler and inhale daily.)   . spironolactone (ALDACTONE) 25 MG tablet Take 1 tablet (25 mg total) by mouth daily.   . traMADol (ULTRAM) 50 MG tablet TAKE ONE TABLET BY MOUTH THREE TIMES A DAY AS NEEDED (Patient taking differently: Take 50 mg by mouth 3 (three) times daily as needed for moderate pain.)   . vitamin E 400 UNIT capsule Take 400 Units by mouth daily.    No facility-administered encounter medications on file as of 08/04/2020.    Current Diagnosis: Patient Active Problem List   Diagnosis Date Noted  . Delirium 06/25/2020  . Actinic keratosis 11/17/2017  . Anemia, iron deficiency 10/23/2015  . Chest pain 12/14/2014  . CKD (chronic kidney disease), stage II 07/25/2014  . Osteoarthritis, knee 05/13/2014  . Anxiety state 04/21/2014  . Chronic diastolic CHF (congestive heart failure) ([place]) 03/13/2014  . SOB (shortness of breath) 03/05/2014  . CAD- RCA PCI '90s, STEMI-RCA DES 02/18/14 03/05/2014  . Back pain, lumbosacral 10/10/2012  . IDDM type 2 03/26/2007  . Hyperlipidemia associated with type 2 diabetes mellitus ([place]) 03/26/2007  . Hypertension associated with diabetes ([place]) 04/21/2006  . COPD (chronic obstructive pulmonary disease) ([place]) 04/21/2006    Recent Relevant Labs: Lab Results  Component Value Date/Time   HGBA1C 6.6 (H) 06/25/2020 06:12 AM   HGBA1C 6.6 (H) 02/18/2020 03:29 PM   MICROALBUR 0.7 01/07/2014 09:43 AM   MICROALBUR 0.2 07/08/2013 03:12 PM    Kidney Function Lab Results  Component Value Date/Time   CREATININE 0.76 07/30/2020 01:43 PM   CREATININE 0.84 07/16/2020 09:48 AM    CREATININE 0.98 (H) 07/03/2020 09:38 AM   CREATININE 0.81 07/01/2020 09:27 AM   GFR 71.37 07/30/2020 01:43 PM   GFRNONAA 66 07/01/2020 09:27 AM   GFRAA 77 07/01/2020 09:27 AM    . Current antihyperglycemic regimen:  o Lantus Solostar 100u/mL, inject 17 to 10 units under the skin every morning. o Metformin 1000 mg tablet, one tablet twice daily with a meal.  . What recent interventions/DTPs have been made to improve glycemic control:   07/30/2020 OV with [name], MD increased Spironolactone 12.5 mg daily to 25 mg daily. 07/01/2020 OV with [name], MD, increased Zoloft 50 mg daily to 100 mg daily.  . Have there been any recent hospitalizations or ED visits since last visit with CPP? Yes  , Patient was hospitalized from June 24, 2020 to June 25, 2020. She was admitted with delirium (with hallucinations including tactile and  speaking with deceased parents )-it was thought that this was related to hypertension with blood pressures in the 190's-patient may have not been taking medication because as soon as home medications were resumed in the hospital blood pressure came down. She was discharged on Toprol 25 mg twice daily, amlodipine 10 mg daily, enalapril 20 mg twice daily, Lasix 40 mg daily. Per patient's son, patient is doing well since her hospitalization.  . [name] reports hypoglycemic symptoms, including shaky hands. He states she does not complain of this, however he has noticed her hands seem to shake at times.  . Patient reports hyperglycemic symptoms, including polyuria and fatigue. However, he states he is not sure if these symptoms are due to her age and other causes.  . How often are you checking your blood sugar? 1-2 x daily  . What are your blood sugars ranging?  o Fasting: 112, 122, 104 o Before meals: n/a o After meals: n/a o Bedtime: n/a  . During the week, how often does your blood glucose drop below 70? Never   . Are you checking your feet  daily/regularly?   No, patient's son states he does not check her feet regularly. However, he states he will start doing so from now on.  Adherence Review: Is the patient currently on a STATIN medication? Yes Is the patient currently on ACE/ARB medication? Yes Does the patient have >5 day gap between last estimated fill dates? No   Other Adherence Questions:  Ask how breathing is now that she is taking Advair twice daily.  [name] states the patient has been doing "pretty good", stating "she hasn't complained". He states she is no longer gasping for breaths and is currently running a warm air humidifier 24/7.  How often are you using albuterol rescue inhaler?  [name] states the patient only uses her rescue inhaler a few times a week.  Confirm patient is taking a total of 1200 mg calcium/day and 1000 units of Vitamin D for bone health in postmenopausal women.  [name] states the patient does currently take 1000 units of Vitamin D, she does not take any calcium supplements at this time. [name] states as soon as the roads clear up he will go out and buy her some calcium supplements as advised.  Schedule February telephone visit with Madelin Rear, Pharm.D., BCGP.  Patient son declined appointment with Madelin Rear, Pharm.D., BCGP at this time.  [name] requested for medications Sertraline 100 mg tablet, Fenofibrate 134 mg capsule and Pantoprazole 40 mg tablets to be transferred from [facility] on [address] to [facility]. So the patient can get all of her medications from the same pharmacy.  I called and spoke with [facility] and put in a request for them to contact Kristopher Oppenheim to have said medications transferred over as requested.  April D Calhoun, [facility]father Pharmacist Assistant [phone]   Follow-Up:  Pharmacist Review

## 2020-08-06 ENCOUNTER — Other Ambulatory Visit: Payer: Self-pay

## 2020-08-06 MED ORDER — FENOFIBRATE MICRONIZED 134 MG PO CAPS
134.0000 mg | ORAL_CAPSULE | Freq: Every day | ORAL | 3 refills | Status: DC
Start: 2020-08-06 — End: 2021-06-07

## 2020-08-06 MED ORDER — PANTOPRAZOLE SODIUM 40 MG PO TBEC
40.0000 mg | DELAYED_RELEASE_TABLET | Freq: Every day | ORAL | 1 refills | Status: DC
Start: 2020-08-06 — End: 2020-09-16

## 2020-08-06 MED ORDER — SERTRALINE HCL 50 MG PO TABS: 100.0000 mg | ORAL_TABLET | Freq: Every day | ORAL | 0 refills | Status: DC

## 2020-08-10 ENCOUNTER — Telehealth: Payer: Self-pay

## 2020-08-10 NOTE — Progress Notes (Cosign Needed)
error 

## 2020-08-12 ENCOUNTER — Inpatient Hospital Stay (HOSPITAL_COMMUNITY)
Admission: EM | Admit: 2020-08-12 | Discharge: 2020-09-01 | DRG: 071 | Disposition: A | Discharge status: Home Health | Payer: Medicare HMO | Attending: Family Medicine | Admitting: Family Medicine

## 2020-08-12 ENCOUNTER — Emergency Department (HOSPITAL_COMMUNITY): Payer: Medicare HMO

## 2020-08-12 DIAGNOSIS — R5381 Other malaise: Secondary | ICD-10-CM | POA: Diagnosis not present

## 2020-08-12 DIAGNOSIS — Z781 Physical restraint status: Secondary | ICD-10-CM

## 2020-08-12 DIAGNOSIS — E1349 Other specified diabetes mellitus with other diabetic neurological complication: Secondary | ICD-10-CM | POA: Diagnosis not present

## 2020-08-12 DIAGNOSIS — E872 Acidosis, unspecified: Secondary | ICD-10-CM | POA: Diagnosis present

## 2020-08-12 DIAGNOSIS — R0902 Hypoxemia: Secondary | ICD-10-CM | POA: Diagnosis not present

## 2020-08-12 DIAGNOSIS — I5032 Chronic diastolic (congestive) heart failure: Secondary | ICD-10-CM | POA: Diagnosis present

## 2020-08-12 DIAGNOSIS — I2510 Atherosclerotic heart disease of native coronary artery without angina pectoris: Secondary | ICD-10-CM | POA: Diagnosis present

## 2020-08-12 DIAGNOSIS — F05 Delirium due to known physiological condition: Secondary | ICD-10-CM | POA: Diagnosis present

## 2020-08-12 DIAGNOSIS — E1165 Type 2 diabetes mellitus with hyperglycemia: Secondary | ICD-10-CM | POA: Diagnosis present

## 2020-08-12 DIAGNOSIS — R0689 Other abnormalities of breathing: Secondary | ICD-10-CM | POA: Diagnosis not present

## 2020-08-12 DIAGNOSIS — E119 Type 2 diabetes mellitus without complications: Secondary | ICD-10-CM

## 2020-08-12 DIAGNOSIS — I110 Hypertensive heart disease with heart failure: Secondary | ICD-10-CM | POA: Diagnosis present

## 2020-08-12 DIAGNOSIS — Z20822 Contact with and (suspected) exposure to covid-19: Secondary | ICD-10-CM | POA: Diagnosis present

## 2020-08-12 DIAGNOSIS — R410 Disorientation, unspecified: Secondary | ICD-10-CM | POA: Diagnosis present

## 2020-08-12 DIAGNOSIS — Z23 Encounter for immunization: Secondary | ICD-10-CM | POA: Diagnosis present

## 2020-08-12 DIAGNOSIS — M545 Low back pain, unspecified: Secondary | ICD-10-CM

## 2020-08-12 DIAGNOSIS — J449 Chronic obstructive pulmonary disease, unspecified: Secondary | ICD-10-CM | POA: Diagnosis present

## 2020-08-12 DIAGNOSIS — E1169 Type 2 diabetes mellitus with other specified complication: Secondary | ICD-10-CM

## 2020-08-12 DIAGNOSIS — F10231 Alcohol dependence with withdrawal delirium: Secondary | ICD-10-CM | POA: Diagnosis not present

## 2020-08-12 DIAGNOSIS — K625 Hemorrhage of anus and rectum: Secondary | ICD-10-CM | POA: Diagnosis not present

## 2020-08-12 DIAGNOSIS — E785 Hyperlipidemia, unspecified: Secondary | ICD-10-CM

## 2020-08-12 DIAGNOSIS — I440 Atrioventricular block, first degree: Secondary | ICD-10-CM | POA: Diagnosis present

## 2020-08-12 DIAGNOSIS — I252 Old myocardial infarction: Secondary | ICD-10-CM

## 2020-08-12 DIAGNOSIS — F0390 Unspecified dementia without behavioral disturbance: Secondary | ICD-10-CM | POA: Diagnosis present

## 2020-08-12 DIAGNOSIS — Z9049 Acquired absence of other specified parts of digestive tract: Secondary | ICD-10-CM | POA: Diagnosis not present

## 2020-08-12 DIAGNOSIS — M1990 Unspecified osteoarthritis, unspecified site: Secondary | ICD-10-CM | POA: Diagnosis present

## 2020-08-12 DIAGNOSIS — Z8249 Family history of ischemic heart disease and other diseases of the circulatory system: Secondary | ICD-10-CM

## 2020-08-12 DIAGNOSIS — E1365 Other specified diabetes mellitus with hyperglycemia: Secondary | ICD-10-CM | POA: Diagnosis present

## 2020-08-12 DIAGNOSIS — R4182 Altered mental status, unspecified: Secondary | ICD-10-CM | POA: Diagnosis not present

## 2020-08-12 DIAGNOSIS — I10 Essential (primary) hypertension: Secondary | ICD-10-CM | POA: Diagnosis not present

## 2020-08-12 DIAGNOSIS — Z794 Long term (current) use of insulin: Secondary | ICD-10-CM | POA: Diagnosis not present

## 2020-08-12 DIAGNOSIS — Z955 Presence of coronary angioplasty implant and graft: Secondary | ICD-10-CM

## 2020-08-12 DIAGNOSIS — IMO0002 Reserved for concepts with insufficient information to code with codable children: Secondary | ICD-10-CM | POA: Diagnosis present

## 2020-08-12 DIAGNOSIS — N182 Chronic kidney disease, stage 2 (mild): Secondary | ICD-10-CM | POA: Diagnosis present

## 2020-08-12 DIAGNOSIS — R456 Violent behavior: Secondary | ICD-10-CM | POA: Diagnosis not present

## 2020-08-12 DIAGNOSIS — Z7982 Long term (current) use of aspirin: Secondary | ICD-10-CM

## 2020-08-12 DIAGNOSIS — G9341 Metabolic encephalopathy: Secondary | ICD-10-CM | POA: Diagnosis not present

## 2020-08-12 DIAGNOSIS — F39 Unspecified mood [affective] disorder: Secondary | ICD-10-CM | POA: Diagnosis present

## 2020-08-12 DIAGNOSIS — R451 Restlessness and agitation: Secondary | ICD-10-CM | POA: Diagnosis not present

## 2020-08-12 DIAGNOSIS — E876 Hypokalemia: Secondary | ICD-10-CM | POA: Diagnosis not present

## 2020-08-12 DIAGNOSIS — Z9181 History of falling: Secondary | ICD-10-CM

## 2020-08-12 DIAGNOSIS — W19XXXA Unspecified fall, initial encounter: Secondary | ICD-10-CM | POA: Diagnosis not present

## 2020-08-12 DIAGNOSIS — Y92239 Unspecified place in hospital as the place of occurrence of the external cause: Secondary | ICD-10-CM | POA: Diagnosis not present

## 2020-08-12 DIAGNOSIS — Z7951 Long term (current) use of inhaled steroids: Secondary | ICD-10-CM

## 2020-08-12 DIAGNOSIS — Z7902 Long term (current) use of antithrombotics/antiplatelets: Secondary | ICD-10-CM

## 2020-08-12 DIAGNOSIS — G9340 Encephalopathy, unspecified: Secondary | ICD-10-CM

## 2020-08-12 DIAGNOSIS — R404 Transient alteration of awareness: Secondary | ICD-10-CM | POA: Diagnosis not present

## 2020-08-12 DIAGNOSIS — F419 Anxiety disorder, unspecified: Secondary | ICD-10-CM | POA: Diagnosis present

## 2020-08-12 DIAGNOSIS — Z79899 Other long term (current) drug therapy: Secondary | ICD-10-CM

## 2020-08-12 DIAGNOSIS — Z87891 Personal history of nicotine dependence: Secondary | ICD-10-CM

## 2020-08-12 DIAGNOSIS — Z885 Allergy status to narcotic agent status: Secondary | ICD-10-CM

## 2020-08-12 DIAGNOSIS — R000 Tachycardia, unspecified: Secondary | ICD-10-CM | POA: Diagnosis not present

## 2020-08-12 LAB — SARS CORONAVIRUS 2 BY RT PCR (HOSPITAL ORDER, PERFORMED IN ~~LOC~~ HOSPITAL LAB): SARS Coronavirus 2: NEGATIVE

## 2020-08-12 LAB — AMMONIA: Ammonia: 23 umol/L (ref 9–35)

## 2020-08-12 LAB — CBC WITH DIFFERENTIAL/PLATELET
Abs Immature Granulocytes: 0.02 10*3/uL (ref 0.00–0.07)
Basophils Absolute: 0 10*3/uL (ref 0.0–0.1)
Basophils Relative: 1 %
Eosinophils Absolute: 0.1 10*3/uL (ref 0.0–0.5)
Eosinophils Relative: 1 %
HCT: 43.3 % (ref 36.0–46.0)
Hemoglobin: 14.5 g/dL (ref 12.0–15.0)
Immature Granulocytes: 0 %
Lymphocytes Relative: 28 %
Lymphs Abs: 2 10*3/uL (ref 0.7–4.0)
MCH: 30.1 pg (ref 26.0–34.0)
MCHC: 33.5 g/dL (ref 30.0–36.0)
MCV: 89.8 fL (ref 80.0–100.0)
Monocytes Absolute: 0.5 10*3/uL (ref 0.1–1.0)
Monocytes Relative: 8 %
Neutro Abs: 4.5 10*3/uL (ref 1.7–7.7)
Neutrophils Relative %: 62 %
Platelets: 233 10*3/uL (ref 150–400)
RBC: 4.82 MIL/uL (ref 3.87–5.11)
RDW: 12.8 % (ref 11.5–15.5)
WBC: 7.2 10*3/uL (ref 4.0–10.5)
nRBC: 0 % (ref 0.0–0.2)

## 2020-08-12 LAB — URINALYSIS, ROUTINE W REFLEX MICROSCOPIC
Bilirubin Urine: NEGATIVE
Glucose, UA: 50 mg/dL — AB
Hgb urine dipstick: NEGATIVE
Ketones, ur: NEGATIVE mg/dL
Leukocytes,Ua: NEGATIVE
Nitrite: NEGATIVE
Protein, ur: >=300 mg/dL — AB
Specific Gravity, Urine: 1.017 (ref 1.005–1.030)
pH: 6 (ref 5.0–8.0)

## 2020-08-12 LAB — COMPREHENSIVE METABOLIC PANEL
ALT: 15 U/L (ref 0–44)
AST: 18 U/L (ref 15–41)
Albumin: 3.8 g/dL (ref 3.5–5.0)
Alkaline Phosphatase: 37 U/L — ABNORMAL LOW (ref 38–126)
Anion gap: 14 (ref 5–15)
BUN: 17 mg/dL (ref 8–23)
CO2: 23 mmol/L (ref 22–32)
Calcium: 9.7 mg/dL (ref 8.9–10.3)
Chloride: 102 mmol/L (ref 98–111)
Creatinine, Ser: 0.8 mg/dL (ref 0.44–1.00)
GFR, Estimated: >60 mL/min (ref >60)
Glucose, Bld: 192 mg/dL — ABNORMAL HIGH (ref 70–99)
Potassium: 3.7 mmol/L (ref 3.5–5.1)
Sodium: 139 mmol/L (ref 135–145)
Total Bilirubin: 0.6 mg/dL (ref 0.3–1.2)
Total Protein: 7.2 g/dL (ref 6.5–8.1)

## 2020-08-12 LAB — GLUCOSE, CAPILLARY
Glucose-Capillary: 104 mg/dL — ABNORMAL HIGH (ref 70–99)
Glucose-Capillary: 128 mg/dL — ABNORMAL HIGH (ref 70–99)

## 2020-08-12 LAB — SALICYLATE LEVEL: Salicylate Lvl: <7 mg/dL — ABNORMAL LOW (ref 7.0–30.0)

## 2020-08-12 LAB — ACETAMINOPHEN LEVEL: Acetaminophen (Tylenol), Serum: <10 ug/mL — ABNORMAL LOW (ref 10–30)

## 2020-08-12 LAB — LACTIC ACID, PLASMA
Lactic Acid, Venous: 3.5 mmol/L (ref 0.5–1.9)
Lactic Acid, Venous: 2 mmol/L (ref 0.5–1.9)
Lactic Acid, Venous: 1.4 mmol/L (ref 0.5–1.9)

## 2020-08-12 LAB — TSH: TSH: 0.82 u[IU]/mL (ref 0.350–4.500)

## 2020-08-12 LAB — MRSA PCR SCREENING: MRSA by PCR: NEGATIVE

## 2020-08-12 IMAGING — CT CT HEAD W/O CM
3 of 6 series · 13 of 47 positions shown, 15 images · non-contrast
Comparison: [DATE]

CLINICAL DATA: Altered mental status

EXAM:
CT HEAD WITHOUT CONTRAST
TECHNIQUE: Contiguous axial images were obtained from the base of the skull
through the vertex without intravenous contrast.

[Series 2: head 5.0 h30s · axial · 0.44mm/px · z∈[-168,-48]mm · 8 of 31 slices shown, 10 images]
[im 4/31  brain]
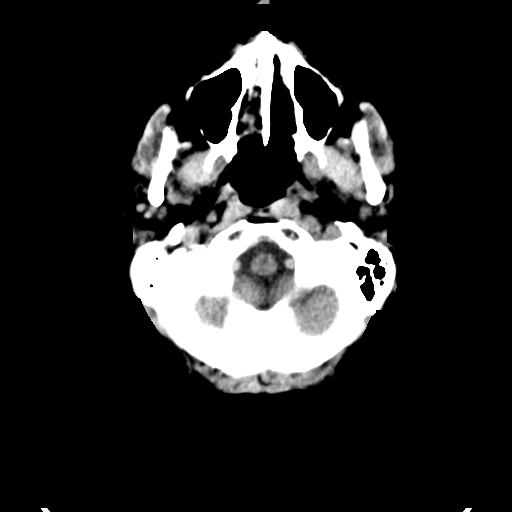
[im 4/31  bone]
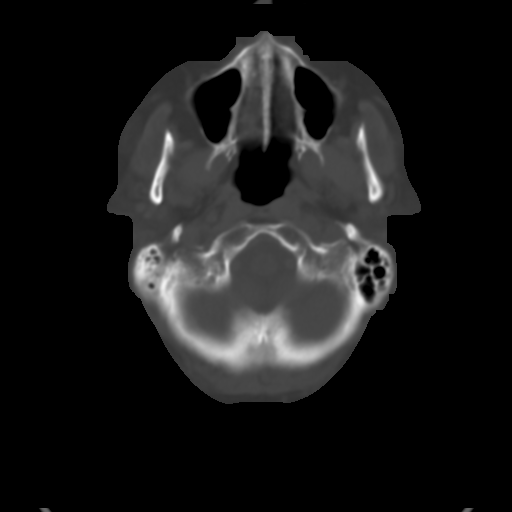
[im 7/31  brain]
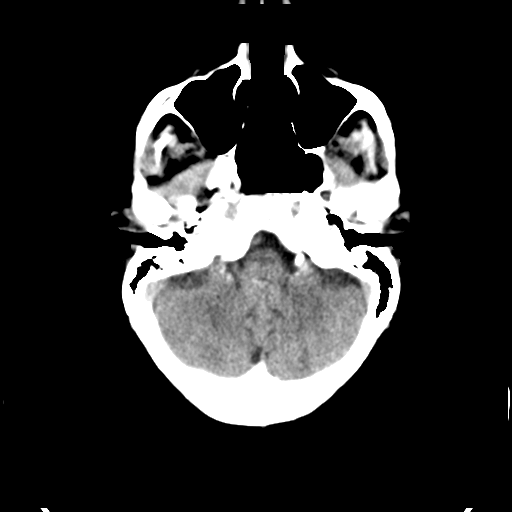
[im 10/31  brain]
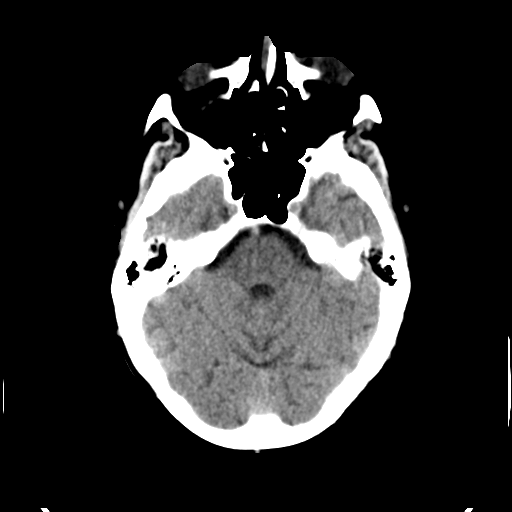
[im 14/31  brain]
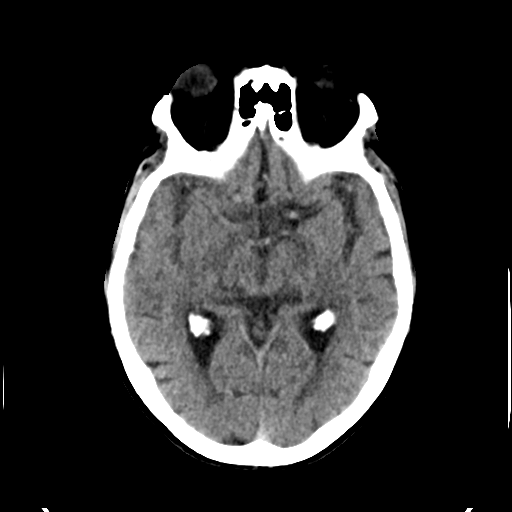
[im 17/31  brain]
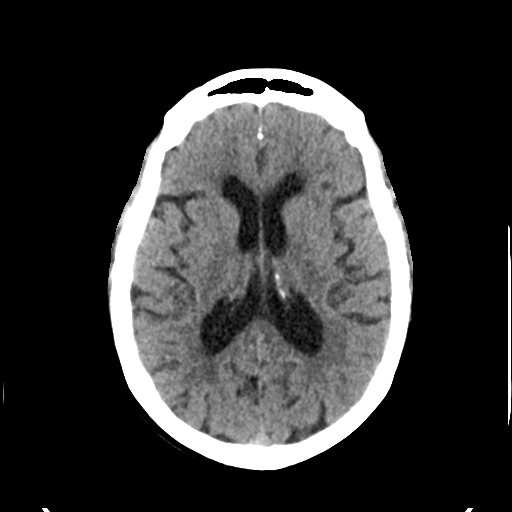
[im 17/31  bone]
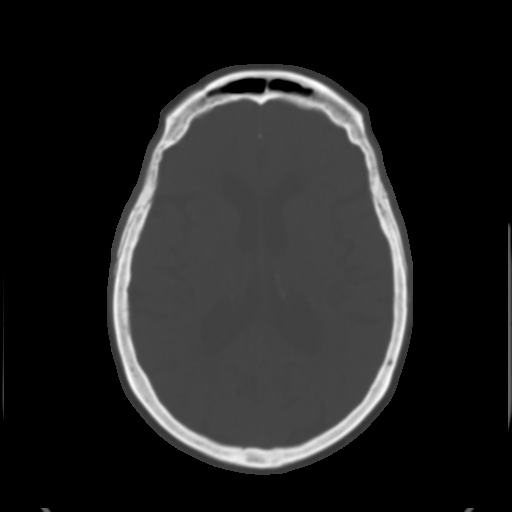
[im 22/31  brain]
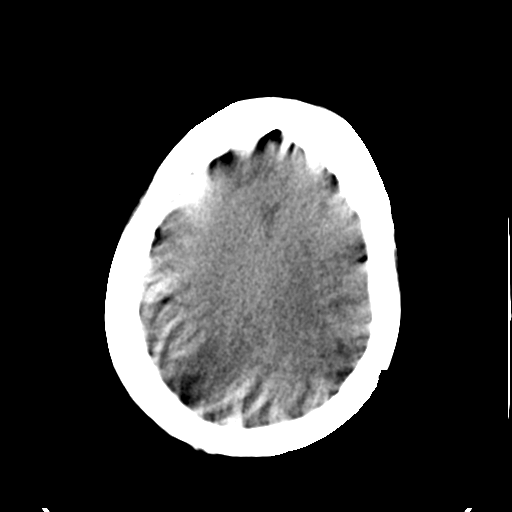
[im 25/31  brain]
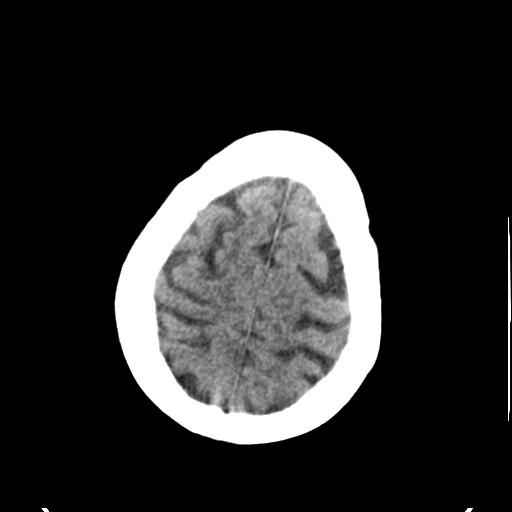
[im 28/31  brain]
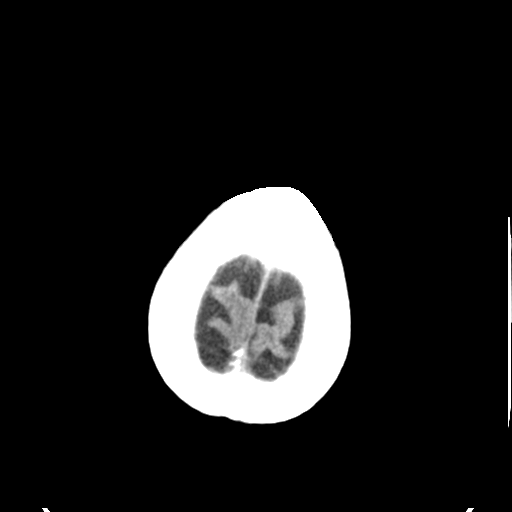

[Series 5: head 3.0 mpr sag · sagittal · 0.33mm/px · 2 of 48 slices shown]
[im 16/48  brain]
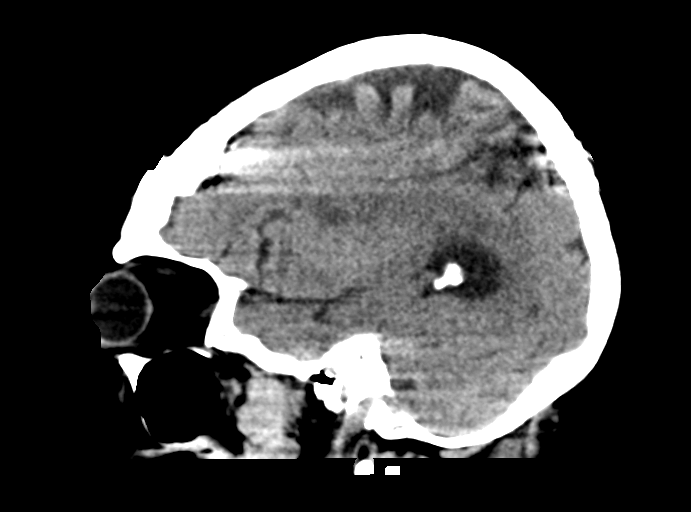
[im 32/48  brain]
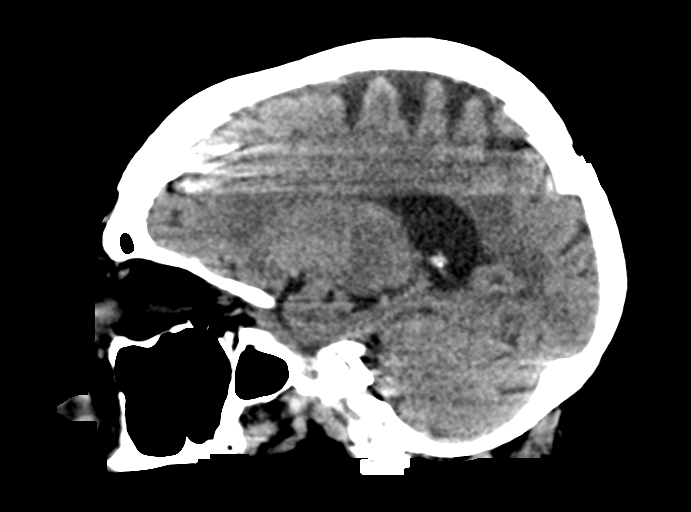

[Series 8: head 3.0 mpr cor · coronal · 0.16mm/px · 3 of 62 slices shown]
[im 16/62  brain]
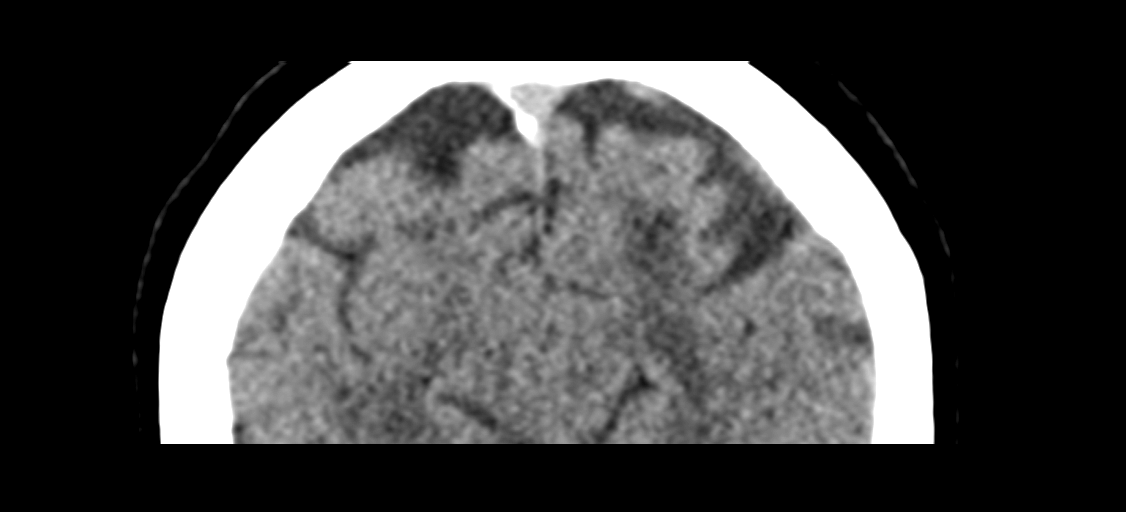
[im 31/62  brain]
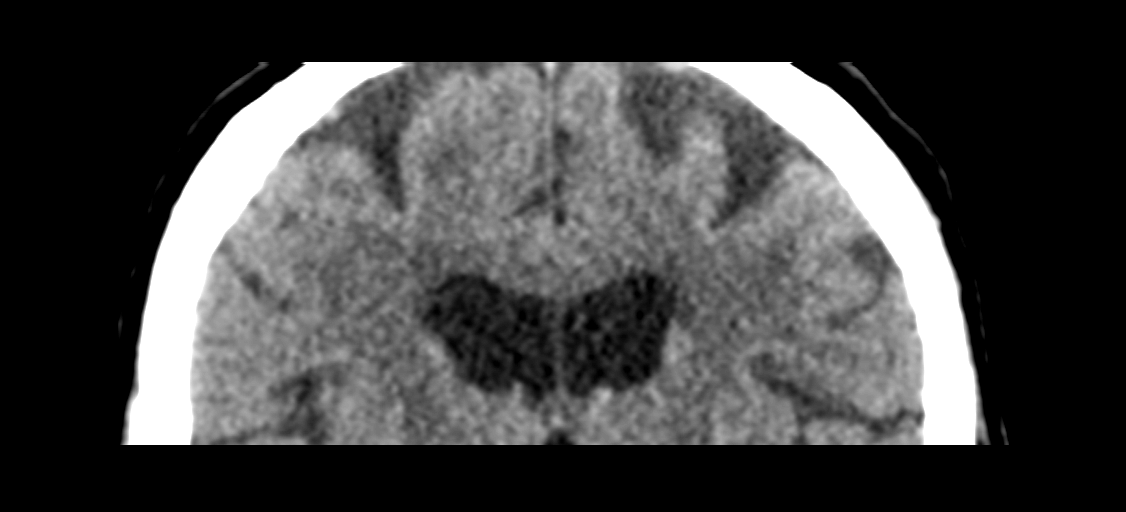
[im 46/62  brain]
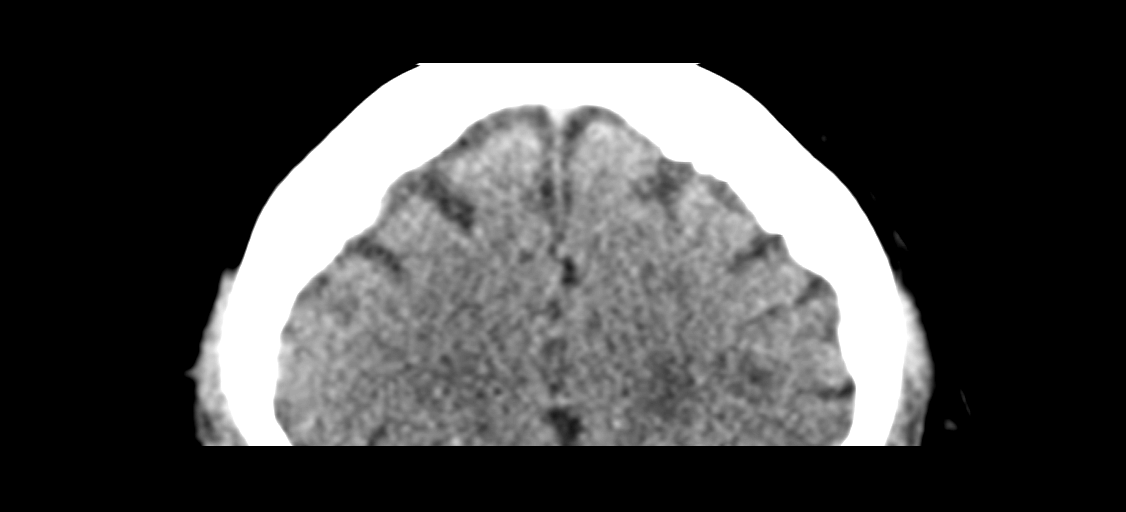

[13 of 47 positions shown; findings below may reference images not displayed]

FINDINGS: Brain: No evidence of acute infarction, hemorrhage, hydrocephalus,
extra-axial collection or mass lesion/mass effect. Mild chronic
white matter ischemic changes are noted.

Vascular: No hyperdense vessel or unexpected calcification.

Skull: Normal. Negative for fracture or focal lesion.

Sinuses/Orbits: No acute finding.

Other: None.
IMPRESSION: Mild chronic white matter ischemic change. No acute abnormality is
noted.

## 2020-08-12 IMAGING — DX DG CHEST 1V PORT
1 series · 1 of 1 positions shown · non-contrast
Comparison: [DATE]

CLINICAL DATA: Altered mental status.

EXAM:
PORTABLE CHEST 1 VIEW

[chest ap]
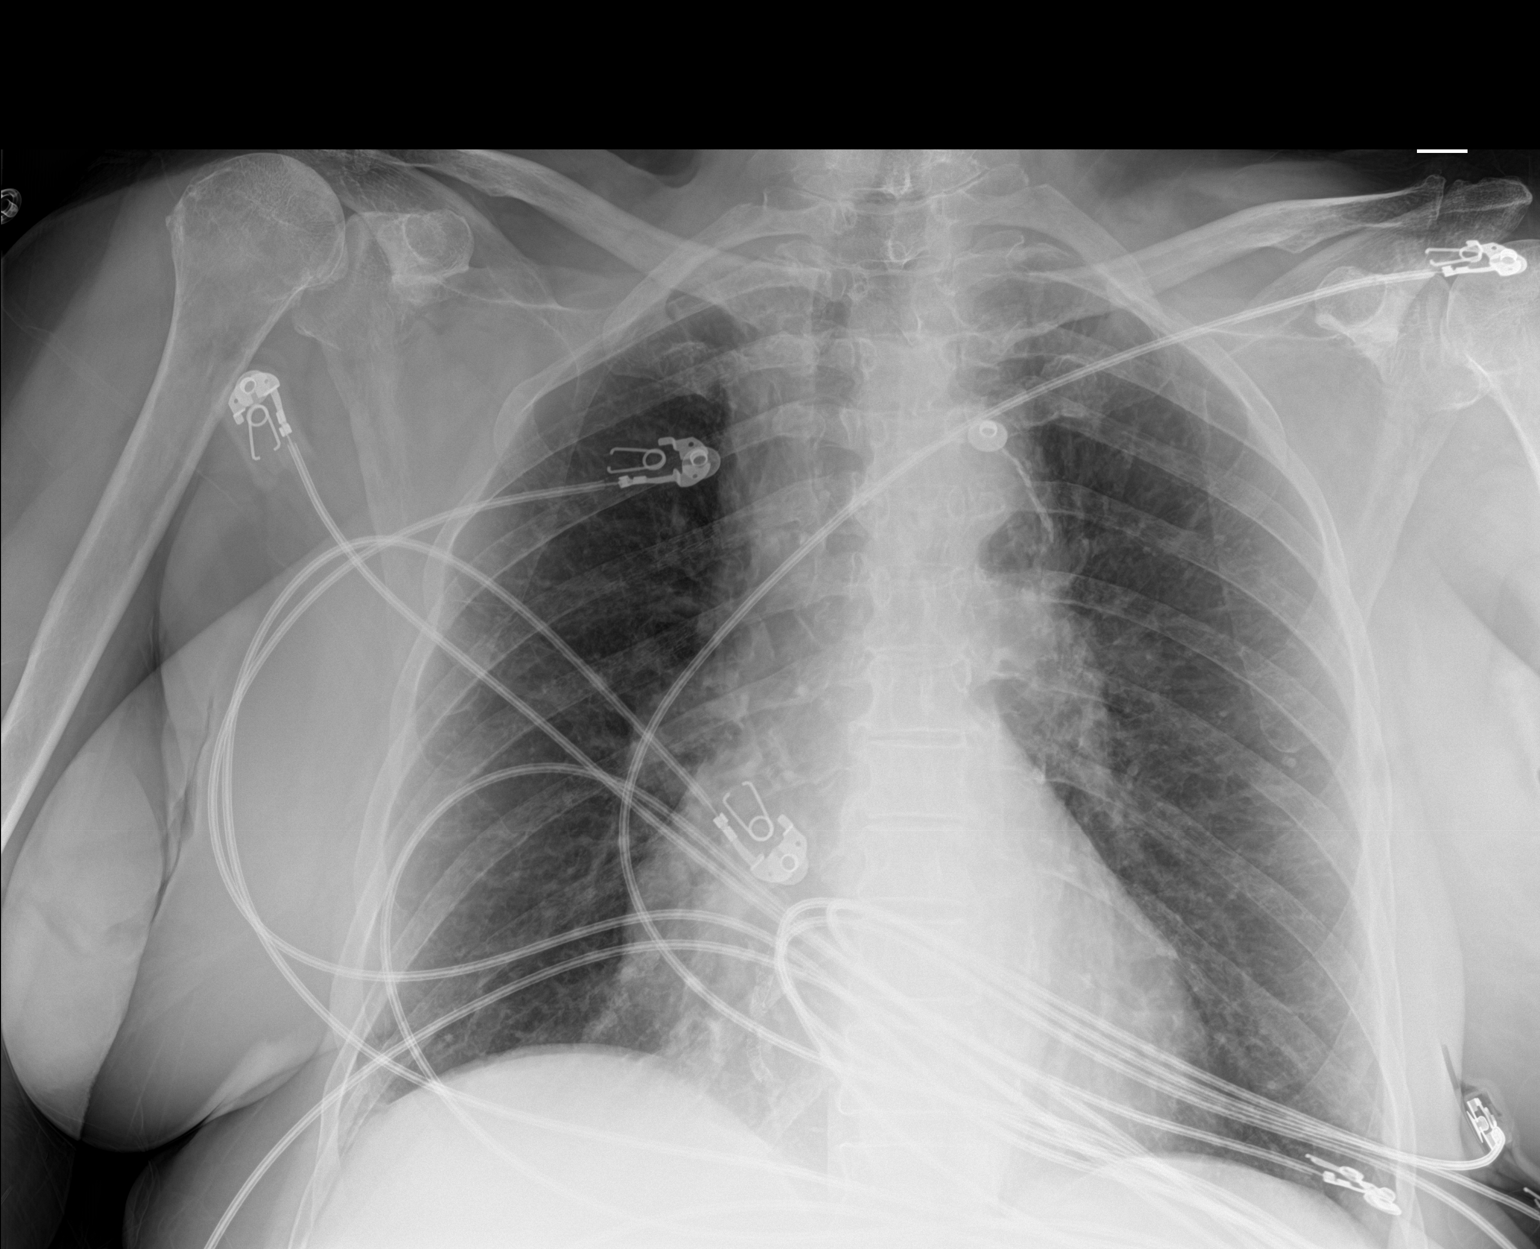

[1 of 1 positions shown; findings below may reference images not displayed]

FINDINGS: Multiple leads project over the chest.

Image rotated slightly to the RIGHT. Accounting for this
cardiomediastinal contours and hilar structures are stable.

Lungs are clear.

No sign of effusion on frontal radiograph.

On limited assessment no acute skeletal process.
IMPRESSION: No acute cardiopulmonary disease.

## 2020-08-12 MED ORDER — FUROSEMIDE 40 MG PO TABS: 40.0000 mg | ORAL_TABLET | Freq: Every day | ORAL | Status: DC

## 2020-08-12 MED ORDER — POTASSIUM CHLORIDE CRYS ER 20 MEQ PO TBCR
20.0000 meq | EXTENDED_RELEASE_TABLET | Freq: Every day | ORAL | Status: DC
  Administered 2020-08-13 – 2020-08-14 (×2): 20 meq via ORAL
  Filled 2020-08-12 (×2): qty 1

## 2020-08-12 MED ORDER — ENOXAPARIN SODIUM 40 MG/0.4ML ~~LOC~~ SOLN
40.0000 mg | SUBCUTANEOUS | Status: DC
  Administered 2020-08-13 – 2020-08-20 (×8): 40 mg via SUBCUTANEOUS
  Filled 2020-08-12 (×8): qty 0.4

## 2020-08-12 MED ORDER — ACETAMINOPHEN 325 MG PO TABS
650.0000 mg | ORAL_TABLET | Freq: Four times a day (QID) | ORAL | Status: DC | PRN
  Administered 2020-08-12 – 2020-08-25 (×11): 650 mg via ORAL
  Filled 2020-08-12 (×11): qty 2

## 2020-08-12 MED ORDER — PANTOPRAZOLE SODIUM 40 MG PO TBEC
40.0000 mg | DELAYED_RELEASE_TABLET | Freq: Every day | ORAL | Status: DC
  Administered 2020-08-13 – 2020-09-01 (×20): 40 mg via ORAL
  Filled 2020-08-12 (×20): qty 1

## 2020-08-12 MED ORDER — ASPIRIN EC 81 MG PO TBEC
81.0000 mg | DELAYED_RELEASE_TABLET | Freq: Every day | ORAL | Status: DC
  Administered 2020-08-13 – 2020-09-01 (×20): 81 mg via ORAL
  Filled 2020-08-12 (×20): qty 1

## 2020-08-12 MED ORDER — ONDANSETRON HCL 4 MG/2ML IJ SOLN: 4.0000 mg | Freq: Four times a day (QID) | INTRAMUSCULAR | Status: DC | PRN

## 2020-08-12 MED ORDER — MELATONIN 5 MG PO TABS
5.0000 mg | ORAL_TABLET | Freq: Every day | ORAL | Status: DC
  Administered 2020-08-12 – 2020-08-14 (×2): 5 mg via ORAL
  Filled 2020-08-12 (×3): qty 1

## 2020-08-12 MED ORDER — ENALAPRIL MALEATE 5 MG PO TABS
20.0000 mg | ORAL_TABLET | Freq: Two times a day (BID) | ORAL | Status: DC
  Administered 2020-08-12 – 2020-09-01 (×37): 20 mg via ORAL
  Filled 2020-08-12 (×38): qty 4

## 2020-08-12 MED ORDER — ACETAMINOPHEN 650 MG RE SUPP: 650.0000 mg | Freq: Four times a day (QID) | RECTAL | Status: DC | PRN

## 2020-08-12 MED ORDER — ROSUVASTATIN CALCIUM 20 MG PO TABS
40.0000 mg | ORAL_TABLET | Freq: Every day | ORAL | Status: DC
Start: 2020-08-12 — End: 2020-09-01
  Administered 2020-08-13 – 2020-09-01 (×20): 40 mg via ORAL
  Filled 2020-08-12 (×20): qty 2

## 2020-08-12 MED ORDER — SPIRONOLACTONE 25 MG PO TABS: 25.0000 mg | ORAL_TABLET | Freq: Every day | ORAL | Status: DC

## 2020-08-12 MED ORDER — SODIUM CHLORIDE 0.9 % IV SOLN: INTRAVENOUS | Status: DC

## 2020-08-12 MED ORDER — FENOFIBRATE 160 MG PO TABS
160.0000 mg | ORAL_TABLET | Freq: Every day | ORAL | Status: DC
  Administered 2020-08-13 – 2020-09-01 (×20): 160 mg via ORAL
  Filled 2020-08-12 (×20): qty 1

## 2020-08-12 MED ORDER — AMLODIPINE BESYLATE 10 MG PO TABS
10.0000 mg | ORAL_TABLET | Freq: Every day | ORAL | Status: DC
  Administered 2020-08-12 – 2020-09-01 (×21): 10 mg via ORAL
  Filled 2020-08-12 (×21): qty 1

## 2020-08-12 MED ORDER — CLOPIDOGREL BISULFATE 75 MG PO TABS
75.0000 mg | ORAL_TABLET | Freq: Every day | ORAL | Status: DC
  Administered 2020-08-12 – 2020-08-21 (×10): 75 mg via ORAL
  Filled 2020-08-12 (×10): qty 1

## 2020-08-12 MED ORDER — ONDANSETRON HCL 4 MG PO TABS: 4.0000 mg | ORAL_TABLET | Freq: Four times a day (QID) | ORAL | Status: DC | PRN

## 2020-08-12 MED ORDER — METOPROLOL TARTRATE 25 MG PO TABS
25.0000 mg | ORAL_TABLET | Freq: Two times a day (BID) | ORAL | Status: DC
  Administered 2020-08-12 – 2020-08-13 (×2): 25 mg via ORAL
  Filled 2020-08-12 (×2): qty 1

## 2020-08-12 MED ORDER — SERTRALINE HCL 100 MG PO TABS
100.0000 mg | ORAL_TABLET | Freq: Every day | ORAL | Status: DC
  Administered 2020-08-14 – 2020-08-31 (×18): 100 mg via ORAL
  Filled 2020-08-12 (×19): qty 1

## 2020-08-12 MED ORDER — MOMETASONE FURO-FORMOTEROL FUM 200-5 MCG/ACT IN AERO
2.0000 | INHALATION_SPRAY | Freq: Two times a day (BID) | RESPIRATORY_TRACT | Status: DC
  Administered 2020-08-13 – 2020-09-01 (×32): 2 via RESPIRATORY_TRACT
  Filled 2020-08-12: qty 8.8

## 2020-08-12 MED ORDER — LORAZEPAM 2 MG/ML IJ SOLN
0.5000 mg | Freq: Once | INTRAMUSCULAR | Status: AC
  Administered 2020-08-12: 0.5 mg via INTRAVENOUS
  Filled 2020-08-12: qty 1

## 2020-08-12 MED ORDER — UMECLIDINIUM BROMIDE 62.5 MCG/INH IN AEPB
1.0000 | INHALATION_SPRAY | Freq: Every day | RESPIRATORY_TRACT | Status: DC
  Administered 2020-08-14 – 2020-09-01 (×12): 1 via RESPIRATORY_TRACT
  Filled 2020-08-12 (×2): qty 7

## 2020-08-12 MED ORDER — TIOTROPIUM BROMIDE MONOHYDRATE 18 MCG IN CAPS: 18.0000 ug | ORAL_CAPSULE | Freq: Every day | RESPIRATORY_TRACT | Status: DC

## 2020-08-12 MED ORDER — INSULIN ASPART 100 UNIT/ML ~~LOC~~ SOLN
0.0000 [IU] | Freq: Three times a day (TID) | SUBCUTANEOUS | Status: DC
  Administered 2020-08-13 (×2): 2 [IU] via SUBCUTANEOUS
  Administered 2020-08-13: 1 [IU] via SUBCUTANEOUS
  Administered 2020-08-14 – 2020-08-15 (×5): 2 [IU] via SUBCUTANEOUS
  Administered 2020-08-16 – 2020-08-17 (×3): 1 [IU] via SUBCUTANEOUS
  Administered 2020-08-17: 3 [IU] via SUBCUTANEOUS
  Administered 2020-08-17: 2 [IU] via SUBCUTANEOUS
  Administered 2020-08-18: 3 [IU] via SUBCUTANEOUS
  Administered 2020-08-18: 2 [IU] via SUBCUTANEOUS
  Administered 2020-08-18: 1 [IU] via SUBCUTANEOUS
  Administered 2020-08-19 (×3): 2 [IU] via SUBCUTANEOUS
  Administered 2020-08-20 (×2): 1 [IU] via SUBCUTANEOUS
  Administered 2020-08-20: 3 [IU] via SUBCUTANEOUS
  Administered 2020-08-21 (×2): 1 [IU] via SUBCUTANEOUS
  Administered 2020-08-21 – 2020-08-22 (×2): 3 [IU] via SUBCUTANEOUS
  Administered 2020-08-22: 1 [IU] via SUBCUTANEOUS
  Administered 2020-08-22: 2 [IU] via SUBCUTANEOUS
  Administered 2020-08-23: 1 [IU] via SUBCUTANEOUS
  Administered 2020-08-23 – 2020-08-24 (×4): 2 [IU] via SUBCUTANEOUS
  Administered 2020-08-25: 1 [IU] via SUBCUTANEOUS
  Administered 2020-08-25 (×2): 2 [IU] via SUBCUTANEOUS
  Administered 2020-08-26: 3 [IU] via SUBCUTANEOUS
  Administered 2020-08-26: 1 [IU] via SUBCUTANEOUS

## 2020-08-12 MED ORDER — INSULIN GLARGINE 100 UNIT/ML ~~LOC~~ SOLN
18.0000 [IU] | Freq: Every day | SUBCUTANEOUS | Status: DC
  Administered 2020-08-13 – 2020-08-22 (×10): 18 [IU] via SUBCUTANEOUS
  Filled 2020-08-12 (×11): qty 0.18

## 2020-08-12 MED ORDER — SODIUM CHLORIDE 0.9% FLUSH
3.0000 mL | Freq: Two times a day (BID) | INTRAVENOUS | Status: DC
  Administered 2020-08-12 – 2020-08-27 (×20): 3 mL via INTRAVENOUS

## 2020-08-12 MED ORDER — DICLOFENAC SODIUM 1 % EX GEL
2.0000 g | Freq: Two times a day (BID) | CUTANEOUS | Status: DC
  Administered 2020-08-12 – 2020-09-01 (×38): 2 g via TOPICAL
  Filled 2020-08-12: qty 100

## 2020-08-12 NOTE — H&P (Addendum)
History and Physical    Michelle [name] UJW:[id number] DOB: [birth year] DOA: 08/12/2020  Referring MD/NP/PA: Lacretia Leigh, MD PCP: [name], MD  Patient coming from: Home  Chief Complaint: altered  I have personally briefly reviewed patient's old medical records in [place]   HPI: Michelle [name] is a 87 y.o. female with medical history significant of hypertension, hyperlipidemia, CAD, MI, and DM type II presents with complaints of altered mental status.  History is obtained from the son over the phone.  He heard his mother snoring very loudly today and went in the room to check on her.  When he went into the room to check on her found her with her eyes open not responding and drooling of the side of her mouth.  Her teeth were very clenched and he immediately called 911. No loss of bowel or bladder noted by the patient's son.  Patient reports that she is not aware of why she is here today and has no recollection of the events leading up to her hospitalization.  Currently she complains of right shoulder and back pain.  Son package pills for her daily and states that her primary care provider said that she could take 3 Tylenol 500 mg 3 times a day with tramadol if needed for pain.  The only recent changes that they increased her spironolactone 12.5 mg daily to 25 mg.  C  ED Course: Upon admission into the emergency department patient was seen to be afebrile, pulse 85-105, respirations 19-31, blood pressures 142/95-186/81, and O2 saturation maintained.  Labs significant for glucose 192, and lactic acid 3.5.  Urinalysis was positive for protein, rare bacteria, 6-10 RBCs, 0-5 squamous epithelial cells, 0-5 WBCs.  CT scan of the brain showed no acute abnormality mild chronic white matter ischemic changes.  Patient had been given Ativan due to agitation and restraints ordered.  Review of Systems  Unable to perform ROS: Mental status change  Musculoskeletal: Positive for back pain and  joint pain.    Past Medical History:  Diagnosis Date  . Anginal pain ([place])   . Arthritis   . AV block, 1st degree   . CAD (coronary artery disease) 1990; 2015   Cardiac cath 1990 with Dr. Lia Foyer and [name] Residual CAD, non obstructive per 2015 cath. STEMI status post stent in August 2015.  Marland Kitchen COPD (chronic obstructive pulmonary disease) ([place])   . Diabetes mellitus type 2, insulin dependent ([place])   . Essential hypertension   . Hyperlipidemia with target LDL less than 70   . Myocardial infarction ([place])   . Pericarditis-post MI (short course of steroids) 03/06/2014  . 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 Shortness of breath     Past Surgical History:  Procedure Laterality Date  . CATARACT EXTRACTION  2020   right and left eye   . CHOLECYSTECTOMY    . CORONARY ANGIOPLASTY WITH STENT PLACEMENT  02/18/14   Promus DES to RCA  . LEFT HEART CATHETERIZATION WITH CORONARY ANGIOGRAM N/A 02/18/2014   Procedure: LEFT HEART CATHETERIZATION WITH CORONARY ANGIOGRAM;  Surgeon: Jettie Booze, MD;  Location: [facility] CATH LAB;  Service: Cardiovascular;  Laterality: N/A;  . PTCA  1990   PTCA of RCA  . TRANSTHORACIC  ECHOCARDIOGRAM  02/02/2012   mild LVH, EF 55-60%, Normal WM, Gr 1 DD; Mild MR     reports that she quit smoking about 13 years ago. Her smoking use included cigarettes. She has a 55.00 pack-year smoking history. She has never used smokeless tobacco. She reports that she does not drink alcohol and does not use drugs.  Allergies  Allergen Reactions  . Codeine Sulfate Nausea Only  . Morphine Sulfate Nausea Only    Family History  Problem Relation Age of Onset  . Heart attack Mother 69  . Cancer Father 36  . Cancer Maternal Grandfather   . Cancer Paternal  Grandmother   . Cancer Paternal Grandfather   . Diabetes Son   . Liver cancer Brother   . Bone cancer Brother   . Heart attack Son   . Hypertension Son   . Diabetes Son   . Hyperlipidemia Son     Prior to Admission medications   Medication Sig Start Date End Date Taking? Authorizing Provider  ACCU-CHEK FASTCLIX LANCETS Handley  12/14/16   [provider]  acetaminophen (TYLENOL) 500 MG tablet Take 500 mg by mouth every 6 (six) hours as needed.    [provider]  albuterol (PROVENTIL) (2.5 MG/3ML) 0.083% nebulizer solution USE THREE MILLILITERS VIA NEBULIZATION BY MOUTH EVERY 6 HOURS AS NEEDED FOR WHEEZING OR SHORTNESS OF BREATH Patient taking differently: Take 2.5 mg by nebulization every 6 (six) hours as needed for wheezing or shortness of breath. 01/31/20   [name], MD  albuterol (VENTOLIN HFA) 108 (90 Base) MCG/ACT inhaler Inhale 1 puff into the lungs every 4 (four) hours as needed for wheezing or shortness of breath (RESCUE inhaler if advair not working). 05/11/20   [name], MD  Alcohol Swabs (B-D SINGLE USE SWABS REGULAR) PADS USE  FOR  TESTING THREE TIMES DAILY 04/09/20   [name], MD  amLODipine (NORVASC) 10 MG tablet TAKE 1 TABLET EVERY DAY Patient taking differently: Take 10 mg by mouth daily. 05/29/20   [name], MD  aspirin 81 MG tablet Take 81 mg by mouth daily.  07/02/14   [provider]  aspirin-sod bicarb-citric acid (ALKA-SELTZER) 325 MG TBEF tablet Take 325 mg by mouth every 6 (six) hours as needed (stomach ache).    [provider]  Blood Glucose Calibration (ACCU-CHEK SMARTVIEW CONTROL) LIQD  01/11/17   [provider]  blood glucose meter kit and supplies KIT Dispense based on patient and insurance preference. Use up to four times daily as directed. (FOR ICD-9 250.00, 250.01). 05/28/20   [name], MD  cholecalciferol (VITAMIN D3) 25 MCG (1000 UNIT) tablet Take 1,000 Units by mouth  daily.    [provider]  clopidogrel (PLAVIX) 75 MG tablet TAKE ONE TABLET BY MOUTH DAILY **PLEASE KEEP UPCOMING APPOINTMENT IN NOVEMBER WITH DOCTOR [facility] FOR FUTURE REFILLS Patient taking differently: Take 75 mg by mouth daily. 02/06/20   [name], MD  DROPLET PEN NEEDLES 31G X 8 MM MISC USE AS DIRECTED WITH LANTUS SOLOSTAR 08/04/20   [name], MD  enalapril (VASOTEC) 20 MG tablet TAKE 1 TABLET TWICE DAILY Patient taking differently: Take 20 mg by mouth 2 (two) times daily. 01/21/20   [name], MD  fenofibrate micronized (LOFIBRA) 134 MG capsule Take 1 capsule (134 mg total) by mouth daily. 08/06/20   [name], MD  ferrous sulfate 325 (65 FE) MG tablet Twice a week 02/21/20   Yong Channel,  Brayton Mars, MD  fish oil-omega-3 fatty acids 1000 MG capsule Take 2 g by mouth daily.    [provider]  Fluticasone-Salmeterol (ADVAIR) 250-50 MCG/DOSE AEPB INHALE ONE PUFF BY MOUTH TWICE DAILY Patient taking differently: Inhale 1 puff into the lungs in the morning and at bedtime. INHALE ONE PUFF BY MOUTH TWICE DAILY 05/18/20   [name], MD  folic acid (FOLVITE) 062 MCG tablet Take 400 mcg by mouth daily.    [provider]  furosemide (LASIX) 40 MG tablet Take 1 tablet (40 mg total) by mouth daily. 04/07/20   [name], MD  glucose blood (ACCU-CHEK SMARTVIEW) test strip TEST BLOOD SUGAR THREE TIMES DAILY 05/28/20   [name], MD  insulin glargine (LANTUS SOLOSTAR) 100 UNIT/ML Solostar Pen INJECT  17  TO  20 UNITS SUBCUTANEOUSLY EVERY MORNING Patient taking differently: Inject 18 Units into the skin daily. 09/23/19   [name], MD  Melatonin 5 MG CAPS Take 1 capsule by mouth at bedtime.     [provider]  metFORMIN (GLUCOPHAGE) 1000 MG tablet TAKE 1 TABLET TWICE DAILY Patient taking differently: Take 1,000 mg by mouth 2 (two) times daily with a meal. 05/04/20   [name], MD  metoprolol tartrate (LOPRESSOR) 25  MG tablet TAKE 1 TABLET TWICE DAILY Patient taking differently: Take 25 mg by mouth 2 (two) times daily. 04/17/20   [name], MD  nitroGLYCERIN (NITROSTAT) 0.4 MG SL tablet PLACE 1 TABLET (0.4 MG TOTAL) UNDER THE TONGUE EVERY 5 (FIVE) MINUTESAS NEEDED FOR CHEST PAIN (CHEST PAIN OR SHORTNESS OF BREATH). Patient taking differently: Place 0.4 mg under the tongue every 5 (five) minutes as needed for chest pain. 07/29/19   Burnell Blanks, MD  pantoprazole (PROTONIX) 40 MG tablet Take 1 tablet (40 mg total) by mouth daily. 08/06/20   [name], MD  rosuvastatin (CRESTOR) 40 MG tablet TAKE 1 TABLET EVERY DAY Patient taking differently: Take 40 mg by mouth daily. 03/20/20   [name], MD  sertraline (ZOLOFT) 50 MG tablet Take 2 tablets (100 mg total) by mouth at bedtime. 08/06/20 09/05/20  [name], MD  SPIRIVA HANDIHALER 18 MCG inhalation capsule PLACE 1 CAPSULE INTO HANDIHALER AND INHALE CONTENTS OF 1 CAPSULE EVERY DAY AS DIRECTED AT 2 PM Patient taking differently: Place 18 mcg into inhaler and inhale daily. 04/09/20   [name], MD  spironolactone (ALDACTONE) 25 MG tablet Take 1 tablet (25 mg total) by mouth daily. 07/30/20   [name], MD  traMADol (ULTRAM) 50 MG tablet TAKE ONE TABLET BY MOUTH THREE TIMES A DAY AS NEEDED Patient taking differently: Take 50 mg by mouth 3 (three) times daily as needed for moderate pain. 02/18/20   [name], MD  vitamin E 400 UNIT capsule Take 400 Units by mouth daily.    [provider]    Physical Exam:  Constitutional: Elderly female who is currently in no acute distress Vitals:   08/12/20 0926 08/12/20 1130 08/12/20 1200 08/12/20 1300  BP: (!) 142/95 (!) 184/81 (!) 186/81 (!) 176/84  Pulse: (!) 105 94 92 85  Resp: (!) 24 (!) 31 19 (!) 23  Temp: 98.4 F (36.9 C)     TempSrc: Rectal     SpO2: 95% 95% 94% 97%   Eyes: PERRL, lids and conjunctivae normal ENMT: Mucous membranes are moist.  Posterior pharynx clear of any exudate or lesions.  Neck: normal, supple, no masses,  no thyromegaly Respiratory: clear to auscultation bilaterally, no wheezing, no crackles. Normal respiratory effort. No accessory muscle use.  Cardiovascular: Regular rate and rhythm, no murmurs / rubs / gallops. No extremity edema. 2+ pedal pulses. No carotid bruits.  Abdomen: no tenderness, no masses palpated. No hepatosplenomegaly. Bowel sounds positive.  Musculoskeletal: no clubbing / cyanosis. No joint deformity upper and lower extremities. Good ROM, no contractures. Normal muscle tone.  Skin: no rashes, lesions, ulcers. No induration Neurologic: CN 2-12 grossly intact. Sensation intact, DTR normal. Strength 5/5 in all 4.  Psychiatric: Currently patient is alert and oriented to person, place, and time.  Patient does appear to be confused.   Labs on Admission: I have personally reviewed following labs and imaging studies  CBC: Recent Labs  Lab 08/12/20 0940  WBC 7.2  NEUTROABS 4.5  HGB 14.5  HCT 43.3  MCV 89.8  PLT 250   Basic Metabolic Panel: Recent Labs  Lab 08/12/20 0940  NA 139  K 3.7  CL 102  CO2 23  GLUCOSE 192*  BUN 17  CREATININE 0.80  CALCIUM 9.7   GFR: CrCl cannot be calculated (Unknown ideal weight.). Liver Function Tests: Recent Labs  Lab 08/12/20 0940  AST 18  ALT 15  ALKPHOS 37*  BILITOT 0.6  PROT 7.2  ALBUMIN 3.8   No results for input(s): LIPASE, AMYLASE in the last 168 hours. No results for input(s): AMMONIA in the last 168 hours. Coagulation Profile: No results for input(s): INR, PROTIME in the last 168 hours. Cardiac Enzymes: No results for input(s): CKTOTAL, CKMB, CKMBINDEX, TROPONINI in the last 168 hours. BNP (last 3 results) No results for input(s): PROBNP in the last 8760 hours. HbA1C: No results for input(s): HGBA1C in the last 72 hours. CBG: No results for input(s): GLUCAP in the last 168 hours. Lipid Profile: No results for input(s): CHOL,  HDL, LDLCALC, TRIG, CHOLHDL, LDLDIRECT in the last 72 hours. Thyroid Function Tests: No results for input(s): TSH, T4TOTAL, FREET4, T3FREE, THYROIDAB in the last 72 hours. Anemia Panel: No results for input(s): VITAMINB12, FOLATE, FERRITIN, TIBC, IRON, RETICCTPCT in the last 72 hours. Urine analysis:    Component Value Date/Time   COLORURINE YELLOW 08/12/2020 1008   APPEARANCEUR CLEAR 08/12/2020 1008   LABSPEC 1.017 08/12/2020 1008   PHURINE 6.0 08/12/2020 1008   GLUCOSEU 50 (A) 08/12/2020 1008   HGBUR NEGATIVE 08/12/2020 1008   BILIRUBINUR NEGATIVE 08/12/2020 1008   BILIRUBINUR Negative 02/22/2019 1616   KETONESUR NEGATIVE 08/12/2020 1008   PROTEINUR >=300 (A) 08/12/2020 1008   UROBILINOGEN 0.2 02/22/2019 1616   NITRITE NEGATIVE 08/12/2020 1008   LEUKOCYTESUR NEGATIVE 08/12/2020 1008   Sepsis Labs: Recent Results (from the past 240 hour(s))  SARS Coronavirus 2 by RT PCR (hospital order, performed in [place] hospital lab) Nasopharyngeal     Status: None   Collection Time: 08/12/20 10:08 AM   Specimen: Nasopharyngeal  Result Value Ref Range Status   SARS Coronavirus 2 NEGATIVE NEGATIVE Final    Comment: (NOTE) SARS-CoV-2 target nucleic acids are NOT DETECTED.  The SARS-CoV-2 RNA is generally detectable in upper and lower respiratory specimens during the acute phase of infection. The lowest concentration of SARS-CoV-2 viral copies this assay can detect is 250 copies / mL. A negative result does not preclude SARS-CoV-2 infection and should not be used as the sole basis for treatment or other patient management decisions.  A negative result may occur with improper specimen collection / handling, submission of specimen other than nasopharyngeal swab,  presence of viral mutation(s) within the areas targeted by this assay, and inadequate number of viral copies (<250 copies / mL). A negative result must be combined with clinical observations, patient history, and epidemiological  information.  Fact Sheet for Patients:   StrictlyIdeas.no  Fact Sheet for Healthcare Providers: BankingDealers.co.za  This test is not yet approved or  cleared by the [place] FDA and has been authorized for detection and/or diagnosis of SARS-CoV-2 by FDA under an Emergency Use Authorization (EUA).  This EUA will remain in effect (meaning this test can be used) for the duration of the COVID-19 declaration under Section 564(b)(1) of the Act, 21 U.S.C. section 360bbb-3(b)(1), unless the authorization is terminated or revoked sooner.  Performed at [facility], [place] [address], [place], [place] [address]      Radiological Exams on Admission: CT Head Wo Contrast  Result Date: 08/12/2020 CLINICAL DATA:  Altered mental status EXAM: CT HEAD WITHOUT CONTRAST TECHNIQUE: Contiguous axial images were obtained from the base of the skull through the vertex without intravenous contrast. COMPARISON:  06/25/2019 FINDINGS: Brain: No evidence of acute infarction, hemorrhage, hydrocephalus, extra-axial collection or mass lesion/mass effect. Mild chronic white matter ischemic changes are noted. Vascular: No hyperdense vessel or unexpected calcification. Skull: Normal. Negative for fracture or focal lesion. Sinuses/Orbits: No acute finding. Other: None. IMPRESSION: Mild chronic white matter ischemic change. No acute abnormality is noted. Electronically Signed   By: Inez Catalina M.D.   On: 08/12/2020 11:22   DG Chest Port 1 View  Result Date: 08/12/2020 CLINICAL DATA:  Altered mental status. EXAM: PORTABLE CHEST 1 VIEW COMPARISON:  June 24, 2020 FINDINGS: Multiple leads project over the chest. Image rotated slightly to the RIGHT. Accounting for this cardiomediastinal contours and hilar structures are stable. Lungs are clear. No sign of effusion on frontal radiograph. On limited assessment no acute skeletal process. IMPRESSION: No acute cardiopulmonary  disease. Electronically Signed   By: Zetta Bills M.D.   On: 08/12/2020 10:05    EKG: Independently reviewed.  Sinus tachycardia 108 bpm  Assessment/Plan Acute metabolic encephalopathy: Presents after a witnessed monitor being found to be unresponsive by her son with teeth clenched and drooling on the side of her mouth.  Symptoms sound at night patient may have had a seizure.  Patient had another hospitalization at the end of December last year where she was altered and reported to be hallucinating that resolved quickly once in the hospital.   -Admit to medical telemetry bed -Seizure precautions -Set bed alarm -Check TSH, acetaminophen level, salicylate level, ammonia level, UDS -Check EEG -Hold tramadol due to possibility of lowering seizure threshold -Neurology consulted, will follow-up for further recommendations  Lactic acidosis: Acute.  On admission lactic acid elevated 3.5.  Urinalysis did not give clear concern for infection. -Follow-up blood culture  -Continue to trend lactic acid  Diabetes mellitus type 2, well controlled: Patient's last hemoglobin A1c was 6.6 on 06/25/2020.  Initial glucose elevated up to 192.  On Lantus 18 units daily and Metformin 500 mg twice daily -Hypoglycemic protocols -Hold Metformin -CBGs before every meal with sensitive SSI  Essential hypertension: Blood pressure currently elevated up to 186/81.  Home medications include amlodipine 10 mg daily, enalapril 20 mg daily, metoprolol 25 mg twice daily, furosemide 40 mg daily, and spironolactone 25 mg daily. -Held furosemide and spironolactone today due to lactic acidosis  -Continue all other home blood pressure medications  Right shoulder and back pain: Acute on chronic.  Patient normally on  tramadol and what sounds like 1500 mg of Tylenol up to 3 times a day. -Follow-up acetaminophen level -Voltaren gel as needed for pain   COPD -Continue home regimen with pharmacy   substitutions  Hyperlipidemia -Continue Crestor 40 mg a day and fenofibrate 134 mg daily  Anxiety -Continue   DVT prophylaxis: Lovenox Code Status: Full Family Communication: Son updated over the phone Disposition Plan: Likely discharge home in 1 to 2 days Consults called: Neurology Admission status: Observation  Norval Morton MD [facility]   If 7PM-7AM, please contact night-coverage   08/12/2020, 1:09 PM

## 2020-08-12 NOTE — ED Notes (Signed)
Date and time results received: 08/12/20  Test: Lactic Acid Critical Value: 3.5  Name of Provider Notified: Zenia Resides

## 2020-08-12 NOTE — ED Notes (Signed)
Pt transported to CT ?

## 2020-08-12 NOTE — ED Triage Notes (Signed)
Pt to ED via EMS from home c/o AMS. Apparently pt was last scene normal at 10pm, Today pt more confused disoriented, husband called EMS. Pt combative with EMS, 5G Versed given . Medical hx: HTN, dm, chf 190/100, 94% RA, hr 110. cbg 167.

## 2020-08-12 NOTE — Consult Note (Signed)
NEURO HOSPITALIST CONSULT NOTE   Requestig physician: Dr. Tamala Julian  Reason for Consult: AMS  History obtained from:  Chart    HPI:                                                                                                                                          Michelle [name] is an 87 y.o. female with a PMHx of HTN, DM, CHF, anginal pain, CAD, COPD, hyperlipidemia, HTN, who presented to the hospital this AM with new complaint of AMS, based on report by her husband. She was LKN at 10 PM on Tuesday. This AM she was difficult to arouse. When she awakened, she was "more confused and disoriented", so her husband called EMS. She was combative with EMS, requiring them to administer Versed 5 mg.   Home medications include ASA, Plavix and tramadol.   Past Medical History:  Diagnosis Date  . Anginal pain ([place])   . Arthritis   . AV block, 1st degree   . CAD (coronary artery disease) 1990; 2015   Cardiac cath 1990 with Dr. Lia Foyer and [name] Residual CAD, non obstructive per 2015 cath. STEMI status post stent in August 2015.  Marland Kitchen COPD (chronic obstructive pulmonary disease) ([place])   . Diabetes mellitus type 2, insulin dependent ([place])   . Essential hypertension   . Hyperlipidemia with target LDL less than 70   . Myocardial infarction ([place])   . Pericarditis-post MI (short course of steroids) 03/06/2014  . 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 Shortness of breath     Past Surgical History:  Procedure Laterality Date  . CATARACT EXTRACTION  2020   right and left eye   . CHOLECYSTECTOMY    . CORONARY ANGIOPLASTY WITH STENT PLACEMENT  02/18/14   Promus DES to RCA  . LEFT HEART CATHETERIZATION WITH CORONARY ANGIOGRAM N/A 02/18/2014   Procedure: LEFT  HEART CATHETERIZATION WITH CORONARY ANGIOGRAM;  Surgeon: Jettie Booze, MD;  Location: [facility] CATH LAB;  Service: Cardiovascular;  Laterality: N/A;  . PTCA  1990   PTCA of RCA  . TRANSTHORACIC ECHOCARDIOGRAM  02/02/2012   mild LVH, EF 55-60%, Normal WM, Gr 1 DD; Mild MR    Family History  Problem Relation Age of Onset  . Heart attack Mother 24  . Cancer Father 46  . Cancer Maternal Grandfather   . Cancer Paternal Grandmother   . Cancer Paternal Grandfather   . Diabetes Son   . Liver cancer Brother   . Bone cancer Brother   .  Heart attack Son   . Hypertension Son   . Diabetes Son   . Hyperlipidemia Son               Social History:  reports that she quit smoking about 13 years ago. Her smoking use included cigarettes. She has a 55.00 pack-year smoking history. She has never used smokeless tobacco. She reports that she does not drink alcohol and does not use drugs.  Allergies  Allergen Reactions  . Codeine Sulfate Nausea Only  . Morphine Sulfate Nausea Only    MEDICATIONS:                                                                                                                     Prior to Admission:  Medications Prior to Admission  Medication Sig Dispense Refill Last Dose  . potassium chloride SA (KLOR-CON) 20 MEQ tablet Take 20 mEq by mouth daily.     Marland Kitchen ACCU-CHEK FASTCLIX LANCETS MISC      . acetaminophen (TYLENOL) 500 MG tablet Take 500 mg by mouth every 6 (six) hours as needed.     Marland Kitchen albuterol (PROVENTIL) (2.5 MG/3ML) 0.083% nebulizer solution USE THREE MILLILITERS VIA NEBULIZATION BY MOUTH EVERY 6 HOURS AS NEEDED FOR WHEEZING OR SHORTNESS OF BREATH (Patient taking differently: Take 2.5 mg by nebulization every 6 (six) hours as needed for wheezing or shortness of breath.) 75 mL 3   . albuterol (VENTOLIN HFA) 108 (90 Base) MCG/ACT inhaler Inhale 1 puff into the lungs every 4 (four) hours as needed for wheezing or shortness of breath (RESCUE inhaler if advair not  working). 18 g 5   . Alcohol Swabs (B-D SINGLE USE SWABS REGULAR) PADS USE  FOR  TESTING THREE TIMES DAILY 300 each 1   . amLODipine (NORVASC) 10 MG tablet TAKE 1 TABLET EVERY DAY (Patient taking differently: Take 10 mg by mouth daily.) 90 tablet 3   . aspirin 81 MG tablet Take 81 mg by mouth daily.      Marland Kitchen aspirin-sod bicarb-citric acid (ALKA-SELTZER) 325 MG TBEF tablet Take 325 mg by mouth every 6 (six) hours as needed (stomach ache).     . Blood Glucose Calibration (ACCU-CHEK SMARTVIEW CONTROL) LIQD      . blood glucose meter kit and supplies KIT Dispense based on patient and insurance preference. Use up to four times daily as directed. (FOR ICD-9 250.00, 250.01). 1 each 0   . cholecalciferol (VITAMIN D3) 25 MCG (1000 UNIT) tablet Take 1,000 Units by mouth daily.     . clopidogrel (PLAVIX) 75 MG tablet TAKE ONE TABLET BY MOUTH DAILY **PLEASE KEEP UPCOMING APPOINTMENT IN NOVEMBER WITH [facility] FOR FUTURE REFILLS (Patient taking differently: Take 75 mg by mouth daily.) 90 tablet 1   . DROPLET PEN NEEDLES 31G X 8 MM MISC USE AS DIRECTED WITH LANTUS SOLOSTAR 300 each 1   . enalapril (VASOTEC) 20 MG tablet TAKE 1 TABLET TWICE DAILY (Patient taking differently: Take 20 mg by mouth 2 (two) times  daily.) 180 tablet 3   . fenofibrate micronized (LOFIBRA) 134 MG capsule Take 1 capsule (134 mg total) by mouth daily. 90 capsule 3   . ferrous sulfate 325 (65 FE) MG tablet Twice a week 18 tablet 3   . fish oil-omega-3 fatty acids 1000 MG capsule Take 2 g by mouth daily.     . Fluticasone-Salmeterol (ADVAIR) 250-50 MCG/DOSE AEPB INHALE ONE PUFF BY MOUTH TWICE DAILY (Patient taking differently: Inhale 1 puff into the lungs in the morning and at bedtime. INHALE ONE PUFF BY MOUTH TWICE DAILY) 60 each 5   . folic acid (FOLVITE) 696 MCG tablet Take 400 mcg by mouth daily.     . furosemide (LASIX) 40 MG tablet Take 1 tablet (40 mg total) by mouth daily. 14 tablet 0   . glucose blood (ACCU-CHEK SMARTVIEW) test  strip TEST BLOOD SUGAR THREE TIMES DAILY 300 strip 1   . insulin glargine (LANTUS SOLOSTAR) 100 UNIT/ML Solostar Pen INJECT  17  TO  20 UNITS SUBCUTANEOUSLY EVERY MORNING (Patient taking differently: Inject 18 Units into the skin daily.) 90 mL 3   . Melatonin 5 MG CAPS Take 1 capsule by mouth at bedtime.      . metFORMIN (GLUCOPHAGE) 1000 MG tablet TAKE 1 TABLET TWICE DAILY (Patient taking differently: Take 1,000 mg by mouth 2 (two) times daily with a meal.) 180 tablet 1   . metoprolol tartrate (LOPRESSOR) 25 MG tablet TAKE 1 TABLET TWICE DAILY (Patient taking differently: Take 25 mg by mouth 2 (two) times daily.) 180 tablet 3   . nitroGLYCERIN (NITROSTAT) 0.4 MG SL tablet PLACE 1 TABLET (0.4 MG TOTAL) UNDER THE TONGUE EVERY 5 (FIVE) MINUTESAS NEEDED FOR CHEST PAIN (CHEST PAIN OR SHORTNESS OF BREATH). (Patient taking differently: Place 0.4 mg under the tongue every 5 (five) minutes as needed for chest pain.) 25 tablet 6   . pantoprazole (PROTONIX) 40 MG tablet Take 1 tablet (40 mg total) by mouth daily. 90 tablet 1   . rosuvastatin (CRESTOR) 40 MG tablet TAKE 1 TABLET EVERY DAY (Patient taking differently: Take 40 mg by mouth daily.) 90 tablet 2   . sertraline (ZOLOFT) 50 MG tablet Take 2 tablets (100 mg total) by mouth at bedtime. 60 tablet 0   . SPIRIVA HANDIHALER 18 MCG inhalation capsule PLACE 1 CAPSULE INTO HANDIHALER AND INHALE CONTENTS OF 1 CAPSULE EVERY DAY AS DIRECTED AT 2 PM (Patient taking differently: Place 18 mcg into inhaler and inhale daily.) 90 capsule 1   . spironolactone (ALDACTONE) 25 MG tablet Take 1 tablet (25 mg total) by mouth daily. 31 tablet 5   . traMADol (ULTRAM) 50 MG tablet TAKE ONE TABLET BY MOUTH THREE TIMES A DAY AS NEEDED (Patient taking differently: Take 50 mg by mouth 3 (three) times daily as needed for moderate pain.) 90 tablet 5   . vitamin E 400 UNIT capsule Take 400 Units by mouth daily.      Scheduled: . amLODipine  10 mg Oral Daily  . aspirin  81 mg Oral Daily   . clopidogrel  75 mg Oral Daily  . diclofenac Sodium  2 g Topical BID  . enalapril  20 mg Oral BID  . enoxaparin (LOVENOX) injection  40 mg Subcutaneous Q24H  . fenofibrate  160 mg Oral Daily  . [START ON 08/13/2020] furosemide  40 mg Oral Daily  . insulin aspart  0-9 Units Subcutaneous TID WC  . [START ON 08/13/2020] insulin glargine  18 Units Subcutaneous Daily  .  Melatonin  1 capsule Oral QHS  . metoprolol tartrate  25 mg Oral BID  . mometasone-formoterol  2 puff Inhalation BID  . pantoprazole  40 mg Oral Daily  . potassium chloride SA  20 mEq Oral Daily  . rosuvastatin  40 mg Oral Daily  . sertraline  100 mg Oral QHS  . sodium chloride flush  3 mL Intravenous Q12H  . [START ON 08/13/2020] spironolactone  25 mg Oral Daily  . tiotropium  18 mcg Inhalation Daily   Continuous: . sodium chloride 75 mL/hr at 08/12/20 1659     ROS:                                                                                                                                       The patient complains of her hands hurting due to restraints. She does not endorse any additional symptoms.    Blood pressure (!) 189/80, pulse 82, temperature 98.6 F (37 C), temperature source Oral, resp. rate 19, SpO2 97 %.   General Examination:                                                                                                       Physical Exam  HEENT-  [place]/AT    Lungs- Respirations unlabored Extremities- No edema  Neurological Examination Mental Status: Awake and alert. Oriented to self, hospital, city, state, year, month and day of the week. She perseverates about being in restraints. She does not seem to recognize that due to multiple attempts to get out of bed despite being asked not to, she must be restrained for her own safety. Her speech is fluent with intact comprehension and naming. No dysarthria. No neglect.  Cranial Nerves: II: Not cooperative with visual field testing. Will fixate on  examiner's face and track. PERRL.  III,IV, VI: No ptosis. Eyes conjugate. EOMI.  V,VII: No facial droop.Facial temp sensation subjectively normal bilaterally VIII: hearing intact to voice IX,X: No hypophonia XI: Symmetric XII: Midline tongue extension Motor: Right : Upper extremity   5/5    Left:     Upper extremity   5/5  Lower extremity   5/5     Lower extremity   5/5 Difficult to formally assess due to agitation, but full strength is noted x 4 without asymmetry Sensory: Reacts to FT x 4 Deep Tendon Reflexes: 2+ and symmetric throughout, except for 0 achilles Plantars: Right: downgoing   Left: downgoing Cerebellar: No gross ataxia with mitt-finger-nose Gait:  Unable to assess   Lab Results: Basic Metabolic Panel: Recent Labs  Lab 08/12/20 0940  NA 139  K 3.7  CL 102  CO2 23  GLUCOSE 192*  BUN 17  CREATININE 0.80  CALCIUM 9.7    CBC: Recent Labs  Lab 08/12/20 0940  WBC 7.2  NEUTROABS 4.5  HGB 14.5  HCT 43.3  MCV 89.8  PLT 233    Cardiac Enzymes: No results for input(s): CKTOTAL, CKMB, CKMBINDEX, TROPONINI in the last 168 hours.  Lipid Panel: No results for input(s): CHOL, TRIG, HDL, CHOLHDL, VLDL, LDLCALC in the last 168 hours.  Imaging: CT Head Wo Contrast  Result Date: 08/12/2020 CLINICAL DATA:  Altered mental status EXAM: CT HEAD WITHOUT CONTRAST TECHNIQUE: Contiguous axial images were obtained from the base of the skull through the vertex without intravenous contrast. COMPARISON:  06/25/2019 FINDINGS: Brain: No evidence of acute infarction, hemorrhage, hydrocephalus, extra-axial collection or mass lesion/mass effect. Mild chronic white matter ischemic changes are noted. Vascular: No hyperdense vessel or unexpected calcification. Skull: Normal. Negative for fracture or focal lesion. Sinuses/Orbits: No acute finding. Other: None. IMPRESSION: Mild chronic white matter ischemic change. No acute abnormality is noted. Electronically Signed   By: Inez Catalina M.D.    On: 08/12/2020 11:22   DG Chest Port 1 View  Result Date: 08/12/2020 CLINICAL DATA:  Altered mental status. EXAM: PORTABLE CHEST 1 VIEW COMPARISON:  June 24, 2020 FINDINGS: Multiple leads project over the chest. Image rotated slightly to the RIGHT. Accounting for this cardiomediastinal contours and hilar structures are stable. Lungs are clear. No sign of effusion on frontal radiograph. On limited assessment no acute skeletal process. IMPRESSION: No acute cardiopulmonary disease. Electronically Signed   By: Zetta Bills M.D.   On: 08/12/2020 10:05    Assessment: 87 year old female with mild AMS and agitation 1. Exam reveals a mildly confused elderly female without focal motor or sensory deficit.  2. CT head: Mild chronic white matter ischemic change. No acute abnormality is noted. 3. DDx for her agitation includes sundowning, mild toxic/metabolic or infectious encephalopathy.   Recommendations: 1. Reorient frequently during the day, encourage sleep at night.  2. Toxic/metabolic/infectious work up.  3. Avoid sedating meds including medications with anticholinergic side effects.  4. PT and OT may help with reorienting and calming the patient.   Electronically signed: Dr. Kerney Elbe 08/12/2020, 8:27 PM

## 2020-08-12 NOTE — ED Notes (Signed)
Visitor at bedside.

## 2020-08-12 NOTE — ED Provider Notes (Signed)
[facility] EMERGENCY DEPARTMENT Provider Note   CSN: [id number] Arrival date & time: 08/12/20  0920     History Chief Complaint  Patient presents with  . Altered Mental Status    Michelle [name] is a 87 y.o. female.  87 year old female presents with altered mental status from home.  According to EMS, patient was difficult to arouse this morning.  When they arrived, patient's blood sugar was above 100.  She then was arousable became combative and required sedation with 5 mg of Versed.  No further history obtainable due to her current state        Past Medical History:  Diagnosis Date  . Anginal pain ([place])   . Arthritis   . AV block, 1st degree   . CAD (coronary artery disease) 1990; 2015   Cardiac cath 1990 with Dr. Lia Foyer and [name] Residual CAD, non obstructive per 2015 cath. STEMI status post stent in August 2015.  Marland Kitchen COPD (chronic obstructive pulmonary disease) ([place])   . Diabetes mellitus type 2, insulin dependent ([place])   . Essential hypertension   . Hyperlipidemia with target LDL less than 70   . Myocardial infarction ([place])   . Pericarditis-post MI (short course of steroids) 03/06/2014  . 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 Shortness of breath     Patient Active Problem List   Diagnosis Date Noted  . Delirium 06/25/2020  . Actinic keratosis 11/17/2017  . Anemia, iron deficiency 10/23/2015  . Chest pain 12/14/2014  . CKD (chronic kidney disease), stage II 07/25/2014  . Osteoarthritis, knee 05/13/2014  . Anxiety state 04/21/2014  . Chronic diastolic CHF (congestive heart failure) ([place]) 03/13/2014  . SOB (shortness of breath) 03/05/2014  . CAD- RCA PCI '90s, STEMI-RCA DES 02/18/14 03/05/2014  . Back pain, lumbosacral  10/10/2012  . IDDM type 2 03/26/2007  . Hyperlipidemia associated with type 2 diabetes mellitus ([place]) 03/26/2007  . Hypertension associated with diabetes ([place]) 04/21/2006  . COPD (chronic obstructive pulmonary disease) ([place]) 04/21/2006    Past Surgical History:  Procedure Laterality Date  . CATARACT EXTRACTION  2020   right and left eye   . CHOLECYSTECTOMY    . CORONARY ANGIOPLASTY WITH STENT PLACEMENT  02/18/14   Promus DES to RCA  . LEFT HEART CATHETERIZATION WITH CORONARY ANGIOGRAM N/A 02/18/2014   Procedure: LEFT HEART CATHETERIZATION WITH CORONARY ANGIOGRAM;  Surgeon: Jettie Booze, MD;  Location: [facility] CATH LAB;  Service: Cardiovascular;  Laterality: N/A;  . PTCA  1990   PTCA of RCA  . TRANSTHORACIC ECHOCARDIOGRAM  02/02/2012   mild LVH, EF 55-60%, Normal WM, Gr 1 DD; Mild MR     OB History   No obstetric history on file.     Family History  Problem Relation Age of Onset  . Heart attack Mother 105  . Cancer Father 36  . Cancer Maternal Grandfather   . Cancer Paternal Grandmother   . Cancer Paternal Grandfather   . Diabetes Son   . Liver cancer Brother   . Bone cancer Brother   . Heart attack Son   . Hypertension Son   . Diabetes Son   . Hyperlipidemia Son     Social History   Tobacco Use  . Smoking  status: Former Smoker    Packs/day: 1.00    Years: 55.00    Pack years: 55.00    Types: Cigarettes    Quit date: 09/29/2006    Years since quitting: 13.8  . Smokeless tobacco: Never Used  Vaping Use  . Vaping Use: Never used  Substance Use Topics  . Alcohol use: No  . Drug use: No    Home Medications Prior to Admission medications   Medication Sig Start Date End Date Taking? Authorizing Provider  ACCU-CHEK FASTCLIX LANCETS [place]  12/14/16   [provider]  acetaminophen (TYLENOL) 500 MG tablet Take 500 mg by mouth every 6 (six) hours as needed.    [provider]  albuterol (PROVENTIL) (2.5 MG/3ML) 0.083% nebulizer solution USE THREE  MILLILITERS VIA NEBULIZATION BY MOUTH EVERY 6 HOURS AS NEEDED FOR WHEEZING OR SHORTNESS OF BREATH Patient taking differently: Take 2.5 mg by nebulization every 6 (six) hours as needed for wheezing or shortness of breath. 01/31/20   [name], MD  albuterol (VENTOLIN HFA) 108 (90 Base) MCG/ACT inhaler Inhale 1 puff into the lungs every 4 (four) hours as needed for wheezing or shortness of breath (RESCUE inhaler if advair not working). 05/11/20   [name], MD  Alcohol Swabs (B-D SINGLE USE SWABS REGULAR) PADS USE  FOR  TESTING THREE TIMES DAILY 04/09/20   [name], MD  amLODipine (NORVASC) 10 MG tablet TAKE 1 TABLET EVERY DAY Patient taking differently: Take 10 mg by mouth daily. 05/29/20   [name], MD  aspirin 81 MG tablet Take 81 mg by mouth daily.  07/02/14   [provider]  aspirin-sod bicarb-citric acid (ALKA-SELTZER) 325 MG TBEF tablet Take 325 mg by mouth every 6 (six) hours as needed (stomach ache).    [provider]  Blood Glucose Calibration (ACCU-CHEK SMARTVIEW CONTROL) LIQD  01/11/17   [provider]  blood glucose meter kit and supplies KIT Dispense based on patient and insurance preference. Use up to four times daily as directed. (FOR ICD-9 250.00, 250.01). 05/28/20   [name], MD  cholecalciferol (VITAMIN D3) 25 MCG (1000 UNIT) tablet Take 1,000 Units by mouth daily.    [provider]  clopidogrel (PLAVIX) 75 MG tablet TAKE ONE TABLET BY MOUTH DAILY **PLEASE KEEP UPCOMING APPOINTMENT IN NOVEMBER WITH DOCTOR [facility] FOR FUTURE REFILLS Patient taking differently: Take 75 mg by mouth daily. 02/06/20   [name], MD  DROPLET PEN NEEDLES 31G X 8 MM MISC USE AS DIRECTED WITH LANTUS SOLOSTAR 08/04/20   [name], MD  enalapril (VASOTEC) 20 MG tablet TAKE 1 TABLET TWICE DAILY Patient taking differently: Take 20 mg by mouth 2 (two) times daily. 01/21/20   [name], MD  fenofibrate micronized  (LOFIBRA) 134 MG capsule Take 1 capsule (134 mg total) by mouth daily. 08/06/20   [name], MD  ferrous sulfate 325 (65 FE) MG tablet Twice a week 02/21/20   [name], MD  fish oil-omega-3 fatty acids 1000 MG capsule Take 2 g by mouth daily.    [provider]  Fluticasone-Salmeterol (ADVAIR) 250-50 MCG/DOSE AEPB INHALE ONE PUFF BY MOUTH TWICE DAILY Patient taking differently: Inhale 1 puff into the lungs in the morning and at bedtime. INHALE ONE PUFF BY MOUTH TWICE DAILY 05/18/20   [name], MD  folic acid (FOLVITE) 620 MCG tablet Take 400 mcg by mouth daily.    [provider]  furosemide (LASIX)  40 MG tablet Take 1 tablet (40 mg total) by mouth daily. 04/07/20   [name], MD  glucose blood (ACCU-CHEK SMARTVIEW) test strip TEST BLOOD SUGAR THREE TIMES DAILY 05/28/20   [name], MD  insulin glargine (LANTUS SOLOSTAR) 100 UNIT/ML Solostar Pen INJECT  17  TO  20 UNITS SUBCUTANEOUSLY EVERY MORNING Patient taking differently: Inject 18 Units into the skin daily. 09/23/19   [name], MD  Melatonin 5 MG CAPS Take 1 capsule by mouth at bedtime.     [provider]  metFORMIN (GLUCOPHAGE) 1000 MG tablet TAKE 1 TABLET TWICE DAILY Patient taking differently: Take 1,000 mg by mouth 2 (two) times daily with a meal. 05/04/20   [name], MD  metoprolol tartrate (LOPRESSOR) 25 MG tablet TAKE 1 TABLET TWICE DAILY Patient taking differently: Take 25 mg by mouth 2 (two) times daily. 04/17/20   [name], MD  nitroGLYCERIN (NITROSTAT) 0.4 MG SL tablet PLACE 1 TABLET (0.4 MG TOTAL) UNDER THE TONGUE EVERY 5 (FIVE) MINUTESAS NEEDED FOR CHEST PAIN (CHEST PAIN OR SHORTNESS OF BREATH). Patient taking differently: Place 0.4 mg under the tongue every 5 (five) minutes as needed for chest pain. 07/29/19   Burnell Blanks, MD  pantoprazole (PROTONIX) 40 MG tablet Take 1 tablet (40 mg total) by mouth daily. 08/06/20   [name], MD  rosuvastatin (CRESTOR) 40 MG tablet TAKE 1 TABLET EVERY DAY Patient taking differently: Take 40 mg by mouth daily. 03/20/20   [name], MD  sertraline (ZOLOFT) 50 MG tablet Take 2 tablets (100 mg total) by mouth at bedtime. 08/06/20 09/05/20  [name], MD  SPIRIVA HANDIHALER 18 MCG inhalation capsule PLACE 1 CAPSULE INTO HANDIHALER AND INHALE CONTENTS OF 1 CAPSULE EVERY DAY AS DIRECTED AT 2 PM Patient taking differently: Place 18 mcg into inhaler and inhale daily. 04/09/20   [name], MD  spironolactone (ALDACTONE) 25 MG tablet Take 1 tablet (25 mg total) by mouth daily. 07/30/20   [name], MD  traMADol (ULTRAM) 50 MG tablet TAKE ONE TABLET BY MOUTH THREE TIMES A DAY AS NEEDED Patient taking differently: Take 50 mg by mouth 3 (three) times daily as needed for moderate pain. 02/18/20   [name], MD  vitamin E 400 UNIT capsule Take 400 Units by mouth daily.    [provider]    Allergies    Codeine sulfate and Morphine sulfate  Review of Systems   Review of Systems  Unable to perform ROS: Acuity of condition    Physical Exam Updated Vital Signs BP (!) 142/95   Pulse (!) 105   Temp 98.4 F (36.9 C) (Rectal)   Resp (!) 24   SpO2 95%   Physical Exam Vitals and nursing note reviewed.  Constitutional:      General: She is not in acute distress.    Appearance: Normal appearance. She is well-developed and well-nourished. She is not toxic-appearing.  HENT:     Head: Normocephalic and atraumatic.  Eyes:     General: Lids are normal.     Extraocular Movements: EOM normal.     Conjunctiva/sclera: Conjunctivae normal.     Pupils: Pupils are equal, round, and reactive to light.  Neck:     Thyroid: No thyroid mass.     Trachea: No tracheal deviation.  Cardiovascular:     Rate and Rhythm: Normal rate and regular rhythm.     Heart sounds: Normal heart sounds.  No murmur heard. No gallop.   Pulmonary:     Effort: Pulmonary effort is  normal. No respiratory distress.     Breath sounds: Normal breath sounds. No stridor. No decreased breath sounds, wheezing, rhonchi or rales.  Abdominal:     General: Bowel sounds are normal. There is no distension.     Palpations: Abdomen is soft.     Tenderness: There is no abdominal tenderness. There is no CVA tenderness or rebound.  Musculoskeletal:        General: No tenderness or edema. Normal range of motion.     Cervical back: Normal range of motion and neck supple.  Skin:    General: Skin is warm and dry.     Findings: No abrasion or rash.  Neurological:     Mental Status: She is lethargic, disoriented and confused.     GCS: GCS eye subscore is 4. GCS verbal subscore is 4. GCS motor subscore is 4.     Cranial Nerves: No cranial nerve deficit.     Motor: No tremor.     Deep Tendon Reflexes: Strength normal.     Comments: Patient moves all 4 extremities at this time  Psychiatric:        Attention and Perception: She is inattentive.        Mood and Affect: Mood and affect normal.     ED Results / Procedures / Treatments   Labs (all labs ordered are listed, but only abnormal results are displayed) Labs Reviewed  URINE CULTURE  CULTURE, BLOOD (ROUTINE X 2)  CULTURE, BLOOD (ROUTINE X 2)  SARS CORONAVIRUS 2 BY RT PCR (HOSPITAL ORDER, [place] [facility])  URINALYSIS, ROUTINE W REFLEX MICROSCOPIC  CBC WITH DIFFERENTIAL/PLATELET  COMPREHENSIVE METABOLIC PANEL  LACTIC ACID, PLASMA    EKG EKG Interpretation  Date/Time:  Wednesday August 12 2020 10:10:44 EST Ventricular Rate:  108 PR Interval:    QRS Duration: 105 QT Interval:  340 QTC Calculation: 456 R Axis:   9 Text Interpretation: Sinus tachycardia Confirmed by Lacretia Leigh ([id number]) on 08/12/2020 10:32:50 AM   Radiology No results found.  Procedures Procedures   Medications Ordered in ED Medications  0.9 %  sodium chloride infusion (has no administration in time range)    ED Course   I have reviewed the triage vital signs and the nursing notes.  Pertinent labs & imaging results that were available during my care of the patient were reviewed by me and considered in my medical decision making (see chart for details).    MDM Rules/Calculators/A&P                          Patient reassessed and is able to recognize her son at the bedside.  Review of old chart shows patient has similar episode like this roughly 6 weeks ago.  Was felt to be due to her high blood pressure.  Will admit for altered mental status work-up Final Clinical Impression(s) / ED Diagnoses Final diagnoses:  None    Rx / DC Orders ED Discharge Orders    None       Lacretia Leigh, MD 08/12/20 1242

## 2020-08-13 ENCOUNTER — Observation Stay (HOSPITAL_COMMUNITY): Payer: Medicare HMO

## 2020-08-13 DIAGNOSIS — I2510 Atherosclerotic heart disease of native coronary artery without angina pectoris: Secondary | ICD-10-CM | POA: Diagnosis not present

## 2020-08-13 DIAGNOSIS — R451 Restlessness and agitation: Secondary | ICD-10-CM | POA: Diagnosis not present

## 2020-08-13 DIAGNOSIS — F39 Unspecified mood [affective] disorder: Secondary | ICD-10-CM | POA: Diagnosis present

## 2020-08-13 DIAGNOSIS — Z9049 Acquired absence of other specified parts of digestive tract: Secondary | ICD-10-CM | POA: Diagnosis not present

## 2020-08-13 DIAGNOSIS — E1349 Other specified diabetes mellitus with other diabetic neurological complication: Secondary | ICD-10-CM | POA: Diagnosis not present

## 2020-08-13 DIAGNOSIS — F0390 Unspecified dementia without behavioral disturbance: Secondary | ICD-10-CM | POA: Diagnosis present

## 2020-08-13 DIAGNOSIS — I440 Atrioventricular block, first degree: Secondary | ICD-10-CM | POA: Diagnosis not present

## 2020-08-13 DIAGNOSIS — Z794 Long term (current) use of insulin: Secondary | ICD-10-CM | POA: Diagnosis not present

## 2020-08-13 DIAGNOSIS — Z20822 Contact with and (suspected) exposure to covid-19: Secondary | ICD-10-CM | POA: Diagnosis not present

## 2020-08-13 DIAGNOSIS — E1365 Other specified diabetes mellitus with hyperglycemia: Secondary | ICD-10-CM | POA: Diagnosis not present

## 2020-08-13 DIAGNOSIS — I110 Hypertensive heart disease with heart failure: Secondary | ICD-10-CM | POA: Diagnosis present

## 2020-08-13 DIAGNOSIS — E1165 Type 2 diabetes mellitus with hyperglycemia: Secondary | ICD-10-CM | POA: Diagnosis not present

## 2020-08-13 DIAGNOSIS — Z23 Encounter for immunization: Secondary | ICD-10-CM | POA: Diagnosis not present

## 2020-08-13 DIAGNOSIS — N182 Chronic kidney disease, stage 2 (mild): Secondary | ICD-10-CM | POA: Diagnosis present

## 2020-08-13 DIAGNOSIS — Z955 Presence of coronary angioplasty implant and graft: Secondary | ICD-10-CM | POA: Diagnosis not present

## 2020-08-13 DIAGNOSIS — G9341 Metabolic encephalopathy: Secondary | ICD-10-CM | POA: Diagnosis not present

## 2020-08-13 DIAGNOSIS — Y92239 Unspecified place in hospital as the place of occurrence of the external cause: Secondary | ICD-10-CM | POA: Diagnosis not present

## 2020-08-13 DIAGNOSIS — Z9181 History of falling: Secondary | ICD-10-CM | POA: Diagnosis not present

## 2020-08-13 DIAGNOSIS — F419 Anxiety disorder, unspecified: Secondary | ICD-10-CM | POA: Diagnosis present

## 2020-08-13 DIAGNOSIS — E119 Type 2 diabetes mellitus without complications: Secondary | ICD-10-CM | POA: Diagnosis not present

## 2020-08-13 DIAGNOSIS — G9340 Encephalopathy, unspecified: Secondary | ICD-10-CM | POA: Diagnosis not present

## 2020-08-13 DIAGNOSIS — K625 Hemorrhage of anus and rectum: Secondary | ICD-10-CM | POA: Diagnosis not present

## 2020-08-13 DIAGNOSIS — E876 Hypokalemia: Secondary | ICD-10-CM | POA: Diagnosis not present

## 2020-08-13 DIAGNOSIS — M1990 Unspecified osteoarthritis, unspecified site: Secondary | ICD-10-CM | POA: Diagnosis not present

## 2020-08-13 DIAGNOSIS — I5032 Chronic diastolic (congestive) heart failure: Secondary | ICD-10-CM | POA: Diagnosis not present

## 2020-08-13 DIAGNOSIS — J449 Chronic obstructive pulmonary disease, unspecified: Secondary | ICD-10-CM | POA: Diagnosis not present

## 2020-08-13 DIAGNOSIS — I10 Essential (primary) hypertension: Secondary | ICD-10-CM | POA: Diagnosis not present

## 2020-08-13 DIAGNOSIS — E872 Acidosis: Secondary | ICD-10-CM | POA: Diagnosis present

## 2020-08-13 DIAGNOSIS — M545 Low back pain, unspecified: Secondary | ICD-10-CM | POA: Diagnosis present

## 2020-08-13 DIAGNOSIS — F05 Delirium due to known physiological condition: Secondary | ICD-10-CM | POA: Diagnosis not present

## 2020-08-13 DIAGNOSIS — R410 Disorientation, unspecified: Secondary | ICD-10-CM | POA: Diagnosis not present

## 2020-08-13 DIAGNOSIS — E785 Hyperlipidemia, unspecified: Secondary | ICD-10-CM | POA: Diagnosis present

## 2020-08-13 DIAGNOSIS — W19XXXA Unspecified fall, initial encounter: Secondary | ICD-10-CM | POA: Diagnosis not present

## 2020-08-13 DIAGNOSIS — Z8249 Family history of ischemic heart disease and other diseases of the circulatory system: Secondary | ICD-10-CM | POA: Diagnosis not present

## 2020-08-13 LAB — BASIC METABOLIC PANEL WITH GFR
Anion gap: 13 (ref 5–15)
BUN: 11 mg/dL (ref 8–23)
CO2: 23 mmol/L (ref 22–32)
Calcium: 9.6 mg/dL (ref 8.9–10.3)
Chloride: 102 mmol/L (ref 98–111)
Creatinine, Ser: 0.74 mg/dL (ref 0.44–1.00)
GFR, Estimated: >60 mL/min
Glucose, Bld: 168 mg/dL — ABNORMAL HIGH (ref 70–99)
Potassium: 3.1 mmol/L — ABNORMAL LOW (ref 3.5–5.1)
Sodium: 138 mmol/L (ref 135–145)

## 2020-08-13 LAB — CBC
HCT: 40.9 % (ref 36.0–46.0)
Hemoglobin: 13.6 g/dL (ref 12.0–15.0)
MCH: 29.3 pg (ref 26.0–34.0)
MCHC: 33.3 g/dL (ref 30.0–36.0)
MCV: 88.1 fL (ref 80.0–100.0)
Platelets: 267 10*3/uL (ref 150–400)
RBC: 4.64 MIL/uL (ref 3.87–5.11)
RDW: 12.6 % (ref 11.5–15.5)
WBC: 12.3 10*3/uL — ABNORMAL HIGH (ref 4.0–10.5)
nRBC: 0 % (ref 0.0–0.2)

## 2020-08-13 LAB — GLUCOSE, CAPILLARY
Glucose-Capillary: 161 mg/dL — ABNORMAL HIGH (ref 70–99)
Glucose-Capillary: 170 mg/dL — ABNORMAL HIGH (ref 70–99)
Glucose-Capillary: 140 mg/dL — ABNORMAL HIGH (ref 70–99)
Glucose-Capillary: 130 mg/dL — ABNORMAL HIGH (ref 70–99)

## 2020-08-13 LAB — URINE CULTURE: Culture: 10000 — AB

## 2020-08-13 MED ORDER — QUETIAPINE FUMARATE 50 MG PO TABS: 25.0000 mg | ORAL_TABLET | Freq: Two times a day (BID) | ORAL | Status: DC

## 2020-08-13 MED ORDER — QUETIAPINE 12.5 MG HALF TABLET
12.5000 mg | ORAL_TABLET | Freq: Four times a day (QID) | ORAL | Status: DC | PRN
  Filled 2020-08-13: qty 1

## 2020-08-13 MED ORDER — LORAZEPAM 2 MG/ML IJ SOLN
INTRAMUSCULAR | Status: AC
  Filled 2020-08-13: qty 1

## 2020-08-13 MED ORDER — LORAZEPAM 2 MG/ML IJ SOLN
0.5000 mg | Freq: Once | INTRAMUSCULAR | Status: AC
  Administered 2020-08-13: 0.5 mg via INTRAVENOUS

## 2020-08-13 MED ORDER — QUETIAPINE FUMARATE 50 MG PO TABS
25.0000 mg | ORAL_TABLET | Freq: Two times a day (BID) | ORAL | Status: DC
  Administered 2020-08-13: 25 mg via ORAL
  Filled 2020-08-13: qty 1

## 2020-08-13 MED ORDER — METOPROLOL TARTRATE 50 MG PO TABS
50.0000 mg | ORAL_TABLET | Freq: Two times a day (BID) | ORAL | Status: DC
Start: 2020-08-13 — End: 2020-09-01
  Administered 2020-08-14 – 2020-09-01 (×37): 50 mg via ORAL
  Filled 2020-08-13 (×38): qty 1

## 2020-08-13 MED ORDER — QUETIAPINE FUMARATE 50 MG PO TABS: 25.0000 mg | ORAL_TABLET | Freq: Once | ORAL | Status: DC

## 2020-08-13 MED ORDER — OLANZAPINE 5 MG PO TBDP
2.5000 mg | ORAL_TABLET | Freq: Three times a day (TID) | ORAL | Status: DC | PRN
Start: 2020-08-13 — End: 2020-08-15
  Filled 2020-08-13 (×2): qty 0.5

## 2020-08-13 MED ORDER — RISPERIDONE 1 MG PO TBDP
1.0000 mg | ORAL_TABLET | Freq: Two times a day (BID) | ORAL | Status: DC
  Administered 2020-08-13 – 2020-08-16 (×6): 1 mg via ORAL
  Filled 2020-08-13 (×9): qty 1

## 2020-08-13 MED ORDER — HALOPERIDOL LACTATE 5 MG/ML IJ SOLN
2.0000 mg | Freq: Once | INTRAMUSCULAR | Status: AC
  Administered 2020-08-13: 2 mg via INTRAVENOUS
  Filled 2020-08-13: qty 1

## 2020-08-13 MED ORDER — LABETALOL HCL 5 MG/ML IV SOLN
10.0000 mg | INTRAVENOUS | Status: DC | PRN
  Administered 2020-08-13 – 2020-08-20 (×15): 10 mg via INTRAVENOUS
  Filled 2020-08-13 (×17): qty 4

## 2020-08-13 NOTE — Progress Notes (Signed)
Patient has redness to bilateral wrists from pulling on the mittens and restraints.  Pt has ripped open the mittens from the inside releasing tiny beads all over her hands and bed.  Pt was cleaned up and foam applied to bilateral wrists before wrist restraints reapplied.  Mittens were left off due to her sticking them under her thigh/buttocks area and trying to rip  them off of her hands.

## 2020-08-13 NOTE — Progress Notes (Signed)
   08/13/20 0226  Assess: MEWS Score  Temp 98.6 F (37 C)  BP (!) 203/82  Pulse Rate 88  ECG Heart Rate 89  Resp 16  Level of Consciousness Alert  SpO2 95 %  O2 Device Room Air  Assess: MEWS Score  MEWS Temp 0  MEWS Systolic 2  MEWS Pulse 0  MEWS RR 0  MEWS LOC 0  MEWS Score 2  MEWS Score Color Yellow  Assess: if the MEWS score is Yellow or Red  Were vital signs taken at a resting state? No (patient agitated and fighting against her wrist restraints)  Focused Assessment No change from prior assessment  Early Detection of Sepsis Score *See Row Information* Low  MEWS guidelines implemented *See Row Information* Yes  Treat  Pain Scale PAINAD  Pain Score 3  Breathing 0  Negative Vocalization 1  Facial Expression 1  Body Language 1  Consolability 0  PAINAD Score 3  Take Vital Signs  Increase Vital Sign Frequency  Yellow: Q 2hr X 2 then Q 4hr X 2, if remains yellow, continue Q 4hrs  Escalate  MEWS: Escalate Yellow: discuss with charge nurse/RN and consider discussing with provider and RRT  Notify: Charge Nurse/RN  Name of Charge Nurse/RN Notified Cheron Schaumann, RN  Date Charge Nurse/RN Notified 08/13/20  Time Charge Nurse/RN Notified 0230  Notify: Provider  Provider Name/Title Dr. Rolanda Lundborg  Date Provider Notified 08/13/20  Time Provider Notified 0230  Notification Type Page  Notification Reason Other (Comment) (bp high; pt c/o pain; pt restless and agitated)  Document  Progress note created (see row info) Yes

## 2020-08-13 NOTE — Progress Notes (Signed)
EEG completed, results pending. 

## 2020-08-13 NOTE — Progress Notes (Addendum)
PROGRESS NOTE    Michelle [name]   [id number]  DOB: February 09, 1933  DOA: 08/12/2020 PCP: [name], MD   Brief Narrative:  Michelle [name] is a 87 y.o. female with medical history significant of hypertension, hyperlipidemia, CAD, MI, and DM type II presents with complaints of altered mental status.  Per HPI:  History is obtained from the son over the phone.  He heard his mother snoring very loudly today and went in the room to check on her.  When he went into the room to check on her found her with her eyes open not responding and drooling of the side of her mouth.  Her teeth were very clenched and he immediately called 911. No loss of bowel or bladder noted by the patient's son.  Patient reports that she is not aware of why she is here today and has no recollection of the events leading up to her hospitalization.  Currently she complains of right shoulder and back pain.  Son package pills for her daily and states that her primary care provider said that she could take 3 Tylenol 500 mg 3 times a day with tramadol if needed for pain.  The only recent changes that they increased her spironolactone 12.5 mg daily to 25 mg.    She was last admitted in 12/21 with delirium and work up was unrevealing. If was felt that uncontrolled BP was contributing to her delirium and that she had underlying dementia.     Subjective: Very confused. No complaints.     Assessment & Plan:   Principal Problem:   Acute encephalopathy - currently in restraints due to severe agitation - likely exacerbation of underlying dementia - UA not consistent with infection- culture showing 10 K colonies of multiple species - EEG > mild diffuse encephalopathy, no epileptiform activity - CT head> Mild chronic white matter ischemic change - start PRN Zyprexa (ODT) for agitation during the day. Start BID Risperdal to reduce overall behavioral issues and restlessness. Have ordered a sitter - spoke with son, Michelle [name] who  would like to be able to take her home if her agitation resolves with medications  Active Problems:   IDDM type 2 - cont SSI and Lantus    Essential hypertension - increase Lopressor from 25 to 50 - cont Vasotec and Norvasc    Lactic acidosis - 2.0 has improved to 1.4  Hypokalemia - replace  Time spent in minutes: 35 DVT prophylaxis: enoxaparin (LOVENOX) injection 40 mg Start: 08/12/20 1515 Code Status: Full code Family Communication: son Level of Care: Level of care: Telemetry Medical Disposition Plan:  Status is: Inpatient  Remains inpatient appropriate because:IV treatments appropriate due to intensity of illness or inability to take PO   Dispo: The patient is from: Home              Anticipated d/c is to: TBD              Anticipated d/c date is: 2 days              Patient currently is not medically stable to d/c.   Difficult to place patient No  Consultants:   Neuro Procedures:   EEG Antimicrobials:  Anti-infectives (From admission, onward)   None       Objective: Vitals:   08/13/20 0449 08/13/20 0722 08/13/20 0735 08/13/20 1255  BP: (!) 166/75 (!) 165/85 (!) 157/88 (!) 190/95  Pulse: 84 91 92 96  Resp: (!) 22 (!) 21 17  18  Temp: 98.6 F (37 C)  98.2 F (36.8 C) 99.7 F (37.6 C)  TempSrc: Oral  Oral Axillary  SpO2: 100% 100% 100% 100%    Intake/Output Summary (Last 24 hours) at 08/13/2020 1605 Last data filed at 08/13/2020 1350 Gross per 24 hour  Intake 1445 ml  Output --  Net 1445 ml   There were no vitals filed for this visit.  Examination: General exam: Appears comfortable  HEENT: PERRLA, oral mucosa moist, no sclera icterus or thrush Respiratory system: Clear to auscultation. Respiratory effort normal. Cardiovascular system: S1 & S2 heard, RRR.   Gastrointestinal system: Abdomen soft, non-tender, nondistended. Normal bowel sounds. Central nervous system: Alert and oriented only to person. No focal neurological deficits. Extremities:  No cyanosis, clubbing or edema Skin: No rashes or ulcers Psychiatry:  Normal mood      Data Reviewed: I have personally reviewed following labs and imaging studies  CBC: Recent Labs  Lab 08/12/20 0940 08/13/20 0439  WBC 7.2 12.3*  NEUTROABS 4.5  --   HGB 14.5 13.6  HCT 43.3 40.9  MCV 89.8 88.1  PLT 233 [id number]   Basic Metabolic Panel: Recent Labs  Lab 08/12/20 0940 08/13/20 0439  NA 139 138  K 3.7 3.1*  CL 102 102  CO2 23 23  GLUCOSE 192* 168*  BUN 17 11  CREATININE 0.80 0.74  CALCIUM 9.7 9.6   GFR: CrCl cannot be calculated (Unknown ideal weight.). Liver Function Tests: Recent Labs  Lab 08/12/20 0940  AST 18  ALT 15  ALKPHOS 37*  BILITOT 0.6  PROT 7.2  ALBUMIN 3.8   No results for input(s): LIPASE, AMYLASE in the last 168 hours. Recent Labs  Lab 08/12/20 1427  AMMONIA 23   Coagulation Profile: No results for input(s): INR, PROTIME in the last 168 hours. Cardiac Enzymes: No results for input(s): CKTOTAL, CKMB, CKMBINDEX, TROPONINI in the last 168 hours. BNP (last 3 results) No results for input(s): PROBNP in the last 8760 hours. HbA1C: No results for input(s): HGBA1C in the last 72 hours. CBG: Recent Labs  Lab 08/12/20 1724 08/12/20 2238 08/13/20 0733 08/13/20 1230 08/13/20 1603  GLUCAP 104* 128* 161* 170* 140*   Lipid Profile: No results for input(s): CHOL, HDL, LDLCALC, TRIG, CHOLHDL, LDLDIRECT in the last 72 hours. Thyroid Function Tests: Recent Labs    08/12/20 1833  TSH 0.820   Anemia Panel: No results for input(s): VITAMINB12, FOLATE, FERRITIN, TIBC, IRON, RETICCTPCT in the last 72 hours. Urine analysis:    Component Value Date/Time   COLORURINE YELLOW 08/12/2020 1008   APPEARANCEUR CLEAR 08/12/2020 1008   LABSPEC 1.017 08/12/2020 1008   PHURINE 6.0 08/12/2020 1008   GLUCOSEU 50 (A) 08/12/2020 1008   HGBUR NEGATIVE 08/12/2020 1008   [place] 08/12/2020 1008   BILIRUBINUR Negative 02/22/2019 1616   KETONESUR  NEGATIVE 08/12/2020 1008   PROTEINUR >=300 (A) 08/12/2020 1008   UROBILINOGEN 0.2 02/22/2019 1616   NITRITE NEGATIVE 08/12/2020 1008   LEUKOCYTESUR NEGATIVE 08/12/2020 1008   Sepsis Labs: @LABRCNTIP (procalcitonin:4,lacticidven:4) ) Recent Results (from the past 240 hour(s))  Culture, blood (Routine X 2) w Reflex to ID Panel     Status: None (Preliminary result)   Collection Time: 08/12/20  9:40 AM   Specimen: BLOOD  Result Value Ref Range Status   Specimen Description BLOOD RIGHT ANTECUBITAL  Final   Special Requests   Final    BOTTLES DRAWN AEROBIC AND ANAEROBIC Blood Culture adequate volume   Culture   Final  NO GROWTH 1 DAY Performed at [facility], [place] [address] [place], [place] [address]    Report Status PENDING  Incomplete  Urine Culture     Status: Abnormal   Collection Time: 08/12/20 10:08 AM   Specimen: Urine, Random  Result Value Ref Range Status   Specimen Description URINE, RANDOM  Final   Special Requests   Final    NONE Performed at [facility], [place] [address], [place], [place] [address]    Culture (A)  Final    10,000 COLONIES/mL MULTIPLE SPECIES PRESENT, SUGGEST RECOLLECTION   Report Status 08/13/2020 FINAL  Final  SARS Coronavirus 2 by RT PCR (hospital order, performed in [facility] hospital lab) Nasopharyngeal     Status: None   Collection Time: 08/12/20 10:08 AM   Specimen: Nasopharyngeal  Result Value Ref Range Status   SARS Coronavirus 2 NEGATIVE NEGATIVE Final    Comment: (NOTE) SARS-CoV-2 target nucleic acids are NOT DETECTED.  The SARS-CoV-2 RNA is generally detectable in upper and lower respiratory specimens during the acute phase of infection. The lowest concentration of SARS-CoV-2 viral copies this assay can detect is 250 copies / mL. A negative result does not preclude SARS-CoV-2 infection and should not be used as the sole basis for treatment or other patient management decisions.  A negative result may occur  with improper specimen collection / handling, submission of specimen other than nasopharyngeal swab, presence of viral mutation(s) within the areas targeted by this assay, and inadequate number of viral copies (<250 copies / mL). A negative result must be combined with clinical observations, patient history, and epidemiological information.  Fact Sheet for Patients:   StrictlyIdeas.no  Fact Sheet for Healthcare Providers: BankingDealers.co.za  This test is not yet approved or  cleared by the [place] FDA and has been authorized for detection and/or diagnosis of SARS-CoV-2 by FDA under an Emergency Use Authorization (EUA).  This EUA will remain in effect (meaning this test can be used) for the duration of the COVID-19 declaration under Section 564(b)(1) of the Act, 21 U.S.C. section 360bbb-3(b)(1), unless the authorization is terminated or revoked sooner.  Performed at [facility], [place] [address] [place], [place] [address]   MRSA PCR Screening     Status: None   Collection Time: 08/12/20  5:04 PM   Specimen: Nasal Mucosa; Nasopharyngeal  Result Value Ref Range Status   MRSA by PCR NEGATIVE NEGATIVE Final    Comment:        The GeneXpert MRSA Assay (FDA approved for NASAL specimens only), is one component of a comprehensive MRSA colonization surveillance program. It is not intended to diagnose MRSA infection nor to guide or monitor treatment for MRSA infections. Performed at [facility], [place] [address], [place], [place] [address]   Culture, blood (Routine X 2) w Reflex to ID Panel     Status: None (Preliminary result)   Collection Time: 08/12/20  6:33 PM   Specimen: BLOOD  Result Value Ref Range Status   Specimen Description BLOOD LEFT ANTECUBITAL  Final   Special Requests   Final    BOTTLES DRAWN AEROBIC AND ANAEROBIC Blood Culture adequate volume   Culture   Final    NO GROWTH < 24 HOURS Performed at  [facility], [place] [address] [place], [place] [address]    Report Status PENDING  Incomplete         Radiology Studies: CT Head Wo Contrast  Result  Date: 08/12/2020 CLINICAL DATA:  Altered mental status EXAM: CT HEAD WITHOUT CONTRAST TECHNIQUE: Contiguous axial images were obtained from the base of the skull through the vertex without intravenous contrast. COMPARISON:  06/25/2019 FINDINGS: Brain: No evidence of acute infarction, hemorrhage, hydrocephalus, extra-axial collection or mass lesion/mass effect. Mild chronic white matter ischemic changes are noted. Vascular: No hyperdense vessel or unexpected calcification. Skull: Normal. Negative for fracture or focal lesion. Sinuses/Orbits: No acute finding. Other: None. IMPRESSION: Mild chronic white matter ischemic change. No acute abnormality is noted. Electronically Signed   By: Inez Catalina M.D.   On: 08/12/2020 11:22   DG Chest Port 1 View  Result Date: 08/12/2020 CLINICAL DATA:  Altered mental status. EXAM: PORTABLE CHEST 1 VIEW COMPARISON:  June 24, 2020 FINDINGS: Multiple leads project over the chest. Image rotated slightly to the RIGHT. Accounting for this cardiomediastinal contours and hilar structures are stable. Lungs are clear. No sign of effusion on frontal radiograph. On limited assessment no acute skeletal process. IMPRESSION: No acute cardiopulmonary disease. Electronically Signed   By: Zetta Bills M.D.   On: 08/12/2020 10:05   EEG adult  Result Date: 08/13/2020 Lora Havens, MD     08/13/2020 12:21 PM Patient Name: Michelle [name] MRN: [id number] Epilepsy Attending: Lora Havens Referring Physician/Provider: Dr. Fuller Plan Date: 08/13/2020 Duration: 23.05 mins Patient history: 67 old female with altered mental status. EEG to evaluate for seizures. Level of alertness: Awake AEDs during EEG study: None Technical aspects: This EEG study was done with scalp electrodes positioned according to the 10-20 International  system of electrode placement. Electrical activity was acquired at a sampling rate of 500Hz  and reviewed with a high frequency filter of 70Hz  and a low frequency filter of 1Hz . EEG data were recorded continuously and digitally stored. Description: The posterior dominant rhythm consists of  8-9 Hz activity of moderate voltage (25-35 uV) seen predominantly in posterior head regions, symmetric and reactive to eye opening and eye closing. EEG showed intermittent generalized 3 to 5 Hz theta-delta slowing. Hyperventilation and photic stimulation were not performed.   ABNORMALITY -Intermittent slow, generalized IMPRESSION: This study is suggestive of mild diffuse encephalopathy, nonspecific etiology. No seizures or epileptiform discharges were seen throughout the recording. Priyanka Barbra Sarks      Scheduled Meds: . amLODipine  10 mg Oral Daily  . aspirin EC  81 mg Oral Daily  . clopidogrel  75 mg Oral Daily  . diclofenac Sodium  2 g Topical BID  . enalapril  20 mg Oral BID  . enoxaparin (LOVENOX) injection  40 mg Subcutaneous Q24H  . fenofibrate  160 mg Oral Daily  . insulin aspart  0-9 Units Subcutaneous TID WC  . insulin glargine  18 Units Subcutaneous Daily  . melatonin  5 mg Oral QHS  . metoprolol tartrate  25 mg Oral BID  . mometasone-formoterol  2 puff Inhalation BID  . pantoprazole  40 mg Oral Daily  . potassium chloride SA  20 mEq Oral Daily  . QUEtiapine  25 mg Oral BID  . QUEtiapine  25 mg Oral Once  . rosuvastatin  40 mg Oral Daily  . sertraline  100 mg Oral QHS  . sodium chloride flush  3 mL Intravenous Q12H  . umeclidinium bromide  1 puff Inhalation Daily   Continuous Infusions: . sodium chloride 75 mL/hr at 08/12/20 1659     LOS: 0 days      Debbe Odea, MD [facility] Pager: [email or web address] 08/13/2020, 4:05 PM

## 2020-08-13 NOTE — Procedures (Addendum)
Patient Name: [name]  MRN: [id number]  Epilepsy Attending: Lora Havens  Referring Physician/Provider: Dr. Fuller Plan Date: 08/13/2020 Duration: 23.05 mins  Patient history: 63 old female with altered mental status. EEG to evaluate for seizures.  Level of alertness: Awake  AEDs during EEG study: None  Technical aspects: This EEG study was done with scalp electrodes positioned according to the 10-20 International system of electrode placement. Electrical activity was acquired at a sampling rate of 500Hz  and reviewed with a high frequency filter of 70Hz  and a low frequency filter of 1Hz . EEG data were recorded continuously and digitally stored.   Description: The posterior dominant rhythm consists of  8-9 Hz activity of moderate voltage (25-35 uV) seen predominantly in posterior head regions, symmetric and reactive to eye opening and eye closing. EEG showed intermittent generalized 3 to 5 Hz theta-delta slowing. Hyperventilation and photic stimulation were not performed.     ABNORMALITY -Intermittent slow, generalized  IMPRESSION: This study is suggestive of mild diffuse encephalopathy, nonspecific etiology. No seizures or epileptiform discharges were seen throughout the recording.  Michelle [name]

## 2020-08-13 NOTE — Progress Notes (Signed)
Patient refusing to take PO meds; RN has educated pt on medication administration; pt agitated and verbally abusive;  RN notified on call for iv meds at this time

## 2020-08-14 DIAGNOSIS — G9340 Encephalopathy, unspecified: Secondary | ICD-10-CM | POA: Diagnosis not present

## 2020-08-14 LAB — GLUCOSE, CAPILLARY
Glucose-Capillary: 156 mg/dL — ABNORMAL HIGH (ref 70–99)
Glucose-Capillary: 159 mg/dL — ABNORMAL HIGH (ref 70–99)
Glucose-Capillary: 100 mg/dL — ABNORMAL HIGH (ref 70–99)
Glucose-Capillary: 150 mg/dL — ABNORMAL HIGH (ref 70–99)

## 2020-08-14 LAB — BASIC METABOLIC PANEL
Anion gap: 13 (ref 5–15)
BUN: 8 mg/dL (ref 8–23)
CO2: 24 mmol/L (ref 22–32)
Calcium: 9.5 mg/dL (ref 8.9–10.3)
Chloride: 105 mmol/L (ref 98–111)
Creatinine, Ser: 0.78 mg/dL (ref 0.44–1.00)
GFR, Estimated: >60 mL/min (ref >60)
Glucose, Bld: 157 mg/dL — ABNORMAL HIGH (ref 70–99)
Potassium: 2.8 mmol/L — ABNORMAL LOW (ref 3.5–5.1)
Sodium: 142 mmol/L (ref 135–145)

## 2020-08-14 MED ORDER — POTASSIUM CHLORIDE CRYS ER 20 MEQ PO TBCR
40.0000 meq | EXTENDED_RELEASE_TABLET | Freq: Two times a day (BID) | ORAL | Status: DC
  Administered 2020-08-14 – 2020-08-15 (×2): 40 meq via ORAL
  Filled 2020-08-14 (×2): qty 2

## 2020-08-14 MED ORDER — INFLUENZA VAC A&B SA ADJ QUAD 0.5 ML IM PRSY
0.5000 mL | PREFILLED_SYRINGE | INTRAMUSCULAR | Status: AC
  Administered 2020-08-17: 0.5 mL via INTRAMUSCULAR
  Filled 2020-08-14 (×2): qty 0.5

## 2020-08-14 NOTE — Progress Notes (Signed)
   08/14/20 0002  Assess: MEWS Score  Temp 99 F (37.2 C)  BP (!) 156/96  Pulse Rate (!) 103  ECG Heart Rate (!) 102  Resp (!) 26  Level of Consciousness Alert  SpO2 100 %  O2 Device Room Air  Assess: MEWS Score  MEWS Temp 0  MEWS Systolic 0  MEWS Pulse 1  MEWS RR 2  MEWS LOC 0  MEWS Score 3  MEWS Score Color Yellow  Assess: if the MEWS score is Yellow or Red  Were vital signs taken at a resting state? No (patient agitated and combative; will not stay still)  Focused Assessment No change from prior assessment  Early Detection of Sepsis Score *See Row Information* Medium  MEWS guidelines implemented *See Row Information* Yes  Treat  MEWS Interventions Administered scheduled meds/treatments  Pain Scale 0-10 (pt states, "I'm not hurting")  Pain Score 0  Take Vital Signs  Increase Vital Sign Frequency  Yellow: Q 2hr X 2 then Q 4hr X 2, if remains yellow, continue Q 4hrs  Escalate  MEWS: Escalate Yellow: discuss with charge nurse/RN and consider discussing with provider and RRT  Notify: Charge Nurse/RN  Name of Charge Nurse/RN Notified Cheron Schaumann, RN  Date Charge Nurse/RN Notified 08/14/20  Time Charge Nurse/RN Notified 0006

## 2020-08-14 NOTE — Progress Notes (Signed)
Pt still agitated and unable to stay still; bp cuff starts to squeeze arm and pt starts waving arms around, moved cuff to leg and pt starting kicking when bp cuff tightened.  Unable to procure an accurate reading at this time.

## 2020-08-14 NOTE — Plan of Care (Signed)
  Problem: Safety: Goal: Non-violent Restraint(s) Outcome: Progressing   Problem: Education: Goal: Knowledge of General Education information will improve Description: Including pain rating scale, medication(s)/side effects and non-pharmacologic comfort measures Outcome: Progressing   Problem: Health Behavior/Discharge Planning: Goal: Ability to manage health-related needs will improve Outcome: Progressing   Problem: Clinical Measurements: Goal: Ability to maintain clinical measurements within normal limits will improve Outcome: Progressing Goal: Will remain free from infection Outcome: Progressing   Problem: Activity: Goal: Risk for activity intolerance will decrease Outcome: Progressing   Problem: Nutrition: Goal: Adequate nutrition will be maintained Outcome: Progressing   Problem: Coping: Goal: Level of anxiety will decrease Outcome: Progressing   Problem: Elimination: Goal: Will not experience complications related to bowel motility Outcome: Progressing   Problem: Pain Managment: Goal: General experience of comfort will improve Outcome: Progressing   Problem: Safety: Goal: Ability to remain free from injury will improve Outcome: Progressing   Problem: Skin Integrity: Goal: Risk for impaired skin integrity will decrease Outcome: Progressing

## 2020-08-14 NOTE — Progress Notes (Signed)
Patient ID: Michelle [name], female   DOB: 1932-11-03, 87 y.o.   MRN: [id number]  PROGRESS NOTE    Michelle [name]  [id number] DOB: [birth year] DOA: 08/12/2020 PCP: [name], MD    Brief Narrative:  History is obtained from the son over the phone. He heard his mother snoring very loudly today and went in the room to check on her. When he went into the room to check on her found her with her eyes open not responding and drooling of the side of her mouth. Her teeth were very clenched and he immediately called 911. No lossof bowel or bladder noted by the patient's son. Patient reports that she is not aware of why she is here today and has no recollection of the events leading up to her hospitalization. Currently she complains of right shoulder and back pain. Son package pills for her dailyand states that her primary care provider said that she could take 3 Tylenol 500 mg 3 times a day with tramadol if needed for pain. The only recent changes that they increased her spironolactone 12.5 mg daily to 25 mg.   She was last admitted in 12/21 with delirium and work up was unrevealing. If was felt that uncontrolled BP was contributing to her delirium and that she had underlying dementia.    Assessment & Plan:   Principal Problem:   Acute encephalopathy Active Problems:   IDDM type 2   Essential hypertension   Back pain, lumbosacral   Lactic acidosis   Hyperlipidemia   Agitated    Acute encephalopathy - currently in restraints due to severe agitation - likely exacerbation of underlying dementia - UA not consistent with infection- culture showing 10 K colonies of multiple species - EEG > mild diffuse encephalopathy, no epileptiform activity - CT head> Mild chronic white matter ischemic change - start PRN Zyprexa (ODT) for agitation during the day. Start BID Risperdal to reduce overall behavioral issues and restlessness. Have ordered a sitter - Michelle [name], her son, would like to be  able to take her home if her agitation resolves with medications -She has not slept in the past 3 days and appears to be less agitated, though more confused   Active Problems:   IDDM type 2 - cont SSI and Lantus    Essential hypertension - increase Lopressor from 25 to 50 - cont Vasotec and Norvasc    Lactic acidosis - 2.0 has improved to 1.4  Hypokalemia - replace and trend -2.8 today have increased her repletion to 40 mEq twice daily    DVT prophylaxis: Lovenox SQ Code Status: Full code  Family Communication: None Disposition Plan: Tentatively home Patient remains inpatient due to ongoing work-up, agitation, need for IV hydration   Consultants:   Neurology  Procedures:  EEG  Antimicrobials: Anti-infectives (From admission, onward)   None       Subjective: Patient is awake in talkative today she reports that she saw her doctor at his office yesterday and is going back tomorrow she does not truly know where she is nor who I am  Objective: Vitals:   08/14/20 1554 08/14/20 1800 08/14/20 1859 08/14/20 1920  BP: (!) 176/87 (!) 174/83  (!) 184/83  Pulse: 83 77  87  Resp: (!) 23 20  19   Temp: 97.9 F (36.6 C)   97.6 F (36.4 C)  TempSrc: Oral   Axillary  SpO2: 100% 100% 100% 100%    Intake/Output Summary (Last 24 hours) at 08/14/2020 1951 Last  data filed at 08/14/2020 1908 Gross per 24 hour  Intake 1140 ml  Output 900 ml  Net 240 ml   There were no vitals filed for this visit.  Examination:  General exam: Appears comfortable  Respiratory system: Clear to auscultation. Respiratory effort normal. Cardiovascular system: S1 & S2 heard, RRR.  Gastrointestinal system: Abdomen is nondistended, soft and nontender.  Extremities: Symmetric  Skin: No rashes Psychiatry: Judgement and insight appear normal. Mood & affect appropriate.     Data Reviewed: I have personally reviewed following labs and imaging studies  CBC: Recent Labs  Lab 08/12/20 0940  08/13/20 0439  WBC 7.2 12.3*  NEUTROABS 4.5  --   HGB 14.5 13.6  HCT 43.3 40.9  MCV 89.8 88.1  PLT 233 425   Basic Metabolic Panel: Recent Labs  Lab 08/12/20 0940 08/13/20 0439 08/14/20 0706  NA 139 138 142  K 3.7 3.1* 2.8*  CL 102 102 105  CO2 23 23 24   GLUCOSE 192* 168* 157*  BUN 17 11 8   CREATININE 0.80 0.74 0.78  CALCIUM 9.7 9.6 9.5   GFR: CrCl cannot be calculated (Unknown ideal weight.). Liver Function Tests: Recent Labs  Lab 08/12/20 0940  AST 18  ALT 15  ALKPHOS 37*  BILITOT 0.6  PROT 7.2  ALBUMIN 3.8   No results for input(s): LIPASE, AMYLASE in the last 168 hours. Recent Labs  Lab 08/12/20 1427  AMMONIA 23   CBG: Recent Labs  Lab 08/13/20 1603 08/13/20 2128 08/14/20 0824 08/14/20 1143 08/14/20 1704  GLUCAP 140* 130* 156* 159* 100*   Thyroid Function Tests: Recent Labs    08/12/20 1833  TSH 0.820   Sepsis Labs: Recent Labs  Lab 08/12/20 0940 08/12/20 1903 08/12/20 2203  LATICACIDVEN 3.5* 2.0* 1.4    Recent Results (from the past 240 hour(s))  Culture, blood (Routine X 2) w Reflex to ID Panel     Status: None (Preliminary result)   Collection Time: 08/12/20  9:40 AM   Specimen: BLOOD  Result Value Ref Range Status   Specimen Description BLOOD RIGHT ANTECUBITAL  Final   Special Requests   Final    BOTTLES DRAWN AEROBIC AND ANAEROBIC Blood Culture adequate volume   Culture   Final    NO GROWTH 2 DAYS Performed at [facility], [place] [address] [place], [place] [address]    Report Status PENDING  Incomplete  Urine Culture     Status: Abnormal   Collection Time: 08/12/20 10:08 AM   Specimen: Urine, Random  Result Value Ref Range Status   Specimen Description URINE, RANDOM  Final   Special Requests   Final    NONE Performed at [facility], [place] [address] [place], [place] [address]    Culture (A)  Final    10,000 COLONIES/mL MULTIPLE SPECIES PRESENT, SUGGEST RECOLLECTION   Report Status 08/13/2020 FINAL  Final   SARS Coronavirus 2 by RT PCR (hospital order, performed in [facility] hospital lab) Nasopharyngeal     Status: None   Collection Time: 08/12/20 10:08 AM   Specimen: Nasopharyngeal  Result Value Ref Range Status   SARS Coronavirus 2 NEGATIVE NEGATIVE Final    Comment: (NOTE) SARS-CoV-2 target nucleic acids are NOT DETECTED.  The SARS-CoV-2 RNA is generally detectable in upper and lower respiratory specimens during the acute phase of infection. The lowest concentration of SARS-CoV-2 viral copies this assay can detect is 250 copies / mL. A negative result does not preclude SARS-CoV-2 infection  and should not be used as the sole basis for treatment or other patient management decisions.  A negative result may occur with improper specimen collection / handling, submission of specimen other than nasopharyngeal swab, presence of viral mutation(s) within the areas targeted by this assay, and inadequate number of viral copies (<250 copies / mL). A negative result must be combined with clinical observations, patient history, and epidemiological information.  Fact Sheet for Patients:   StrictlyIdeas.no  Fact Sheet for Healthcare Providers: BankingDealers.co.za  This test is not yet approved or  cleared by the [place] FDA and has been authorized for detection and/or diagnosis of SARS-CoV-2 by FDA under an Emergency Use Authorization (EUA).  This EUA will remain in effect (meaning this test can be used) for the duration of the COVID-19 declaration under Section 564(b)(1) of the Act, 21 U.S.C. section 360bbb-3(b)(1), unless the authorization is terminated or revoked sooner.  Performed at [facility], [place] [address] [place], [place] [address]   MRSA PCR Screening     Status: None   Collection Time: 08/12/20  5:04 PM   Specimen: Nasal Mucosa; Nasopharyngeal  Result Value Ref Range Status   MRSA by PCR NEGATIVE NEGATIVE Final     Comment:        The GeneXpert MRSA Assay (FDA approved for NASAL specimens only), is one component of a comprehensive MRSA colonization surveillance program. It is not intended to diagnose MRSA infection nor to guide or monitor treatment for MRSA infections. Performed at [facility], [place] [address], [place], [place] [address]   Culture, blood (Routine X 2) w Reflex to ID Panel     Status: None (Preliminary result)   Collection Time: 08/12/20  6:33 PM   Specimen: BLOOD  Result Value Ref Range Status   Specimen Description BLOOD LEFT ANTECUBITAL  Final   Special Requests   Final    BOTTLES DRAWN AEROBIC AND ANAEROBIC Blood Culture adequate volume   Culture   Final    NO GROWTH 2 DAYS Performed at [facility], [place] [address], [place], [place] [address]    Report Status PENDING  Incomplete      Radiology Studies: EEG adult  Result Date: 19-Aug-2020 Lora Havens, MD     Aug 19, 2020 12:21 PM Patient Name: LALA BYAM MRN: [id number] Epilepsy Attending: Lora Havens Referring Physician/Provider: Dr. Fuller Plan Date: 19-Aug-2020 Duration: 23.05 mins Patient history: 64 old female with altered mental status. EEG to evaluate for seizures. Level of alertness: Awake AEDs during EEG study: None Technical aspects: This EEG study was done with scalp electrodes positioned according to the 10-20 International system of electrode placement. Electrical activity was acquired at a sampling rate of 500Hz  and reviewed with a high frequency filter of 70Hz  and a low frequency filter of 1Hz . EEG data were recorded continuously and digitally stored. Description: The posterior dominant rhythm consists of  8-9 Hz activity of moderate voltage (25-35 uV) seen predominantly in posterior head regions, symmetric and reactive to eye opening and eye closing. EEG showed intermittent generalized 3 to 5 Hz theta-delta slowing. Hyperventilation and photic stimulation were not performed.   ABNORMALITY  -Intermittent slow, generalized IMPRESSION: This study is suggestive of mild diffuse encephalopathy, nonspecific etiology. No seizures or epileptiform discharges were seen throughout the recording. Priyanka Barbra Sarks     Scheduled Meds: . amLODipine  10 mg Oral Daily  . aspirin EC  81 mg Oral Daily  . clopidogrel  75  mg Oral Daily  . diclofenac Sodium  2 g Topical BID  . enalapril  20 mg Oral BID  . enoxaparin (LOVENOX) injection  40 mg Subcutaneous Q24H  . fenofibrate  160 mg Oral Daily  . [START ON 08/15/2020] influenza vaccine adjuvanted  0.5 mL Intramuscular Tomorrow-1000  . insulin aspart  0-9 Units Subcutaneous TID WC  . insulin glargine  18 Units Subcutaneous Daily  . melatonin  5 mg Oral QHS  . metoprolol tartrate  50 mg Oral BID  . mometasone-formoterol  2 puff Inhalation BID  . pantoprazole  40 mg Oral Daily  . potassium chloride SA  20 mEq Oral Daily  . risperiDONE  1 mg Oral BID  . rosuvastatin  40 mg Oral Daily  . sertraline  100 mg Oral QHS  . sodium chloride flush  3 mL Intravenous Q12H  . umeclidinium bromide  1 puff Inhalation Daily   Continuous Infusions: . sodium chloride 75 mL/hr at 08/14/20 1900     LOS: 1 day    Donnamae Jude, MD 08/14/2020 7:51 PM [phone] [facility] If 7PM-7AM, please contact night-coverage 08/14/2020, 7:51 PM

## 2020-08-15 DIAGNOSIS — I10 Essential (primary) hypertension: Secondary | ICD-10-CM | POA: Diagnosis not present

## 2020-08-15 LAB — BASIC METABOLIC PANEL
Anion gap: 10 (ref 5–15)
BUN: 11 mg/dL (ref 8–23)
CO2: 22 mmol/L (ref 22–32)
Calcium: 9.6 mg/dL (ref 8.9–10.3)
Chloride: 112 mmol/L — ABNORMAL HIGH (ref 98–111)
Creatinine, Ser: 0.72 mg/dL (ref 0.44–1.00)
GFR, Estimated: >60 mL/min (ref >60)
Glucose, Bld: 166 mg/dL — ABNORMAL HIGH (ref 70–99)
Potassium: 3.7 mmol/L (ref 3.5–5.1)
Sodium: 144 mmol/L (ref 135–145)

## 2020-08-15 LAB — CBC
HCT: 40.2 % (ref 36.0–46.0)
Hemoglobin: 13.9 g/dL (ref 12.0–15.0)
MCH: 30.5 pg (ref 26.0–34.0)
MCHC: 34.6 g/dL (ref 30.0–36.0)
MCV: 88.2 fL (ref 80.0–100.0)
Platelets: 257 10*3/uL (ref 150–400)
RBC: 4.56 MIL/uL (ref 3.87–5.11)
RDW: 13 % (ref 11.5–15.5)
WBC: 7.8 10*3/uL (ref 4.0–10.5)
nRBC: 0 % (ref 0.0–0.2)

## 2020-08-15 LAB — GLUCOSE, CAPILLARY
Glucose-Capillary: 162 mg/dL — ABNORMAL HIGH (ref 70–99)
Glucose-Capillary: 137 mg/dL — ABNORMAL HIGH (ref 70–99)
Glucose-Capillary: 132 mg/dL — ABNORMAL HIGH (ref 70–99)
Glucose-Capillary: 147 mg/dL — ABNORMAL HIGH (ref 70–99)

## 2020-08-15 LAB — MAGNESIUM: Magnesium: 1.8 mg/dL (ref 1.7–2.4)

## 2020-08-15 NOTE — Progress Notes (Signed)
PROGRESS NOTE    Michelle [name]    Code Status: Full Code  PIR:[id number] DOB: 05/30/1933 DOA: 08/12/2020 LOS: 2 days  PCP: [name], MD CC:  Chief Complaint  Patient presents with  . Altered Mental Status       Hospital Summary   This is an 87 year old female with a past medical history of type 2 diabetes, hypertension, hyperlipidemia, CAD, MI who presented to the ED on 1/26 with complaints of altered mental status.  Per Dr. Reggy Eye note 08/13/2020: Per HPI:  History is obtained from the son over the phone. He heard his mother snoring very loudly today and went in the room to check on her. When he went into the room to check on her found her with her eyes open not responding and drooling of the side of her mouth. Her teeth were very clenched and he immediately called 911. No lossof bowel or bladder noted by the patient's son. Patient reports that she is not aware of why she is here today and has no recollection of the events leading up to her hospitalization. Currently she complains of right shoulder and back pain. Son package pills for her dailyand states that her primary care provider said that she could take 3 Tylenol 500 mg 3 times a day with tramadol if needed for pain. The only recent changes that they increased her spironolactone 12.5 mg daily to 25 mg.   Of note: She was last admitted in 12/21 with delirium and work up was unrevealing. If was felt that uncontrolled BP was contributing to her delirium and that she had underlying dementia.   1/27: EEG suggestive of mild diffuse encephalopathy and nonspecific etiology with no seizures or epileptiform discharges.  She was started on Zyprexa as needed for agitation during the day and Risperdal twice daily to reduce overall behavioral issues and restlessness.     A & P   Principal Problem:   Acute encephalopathy Active Problems:   IDDM type 2   Essential hypertension   Back pain, lumbosacral   Lactic  acidosis   Hyperlipidemia   Agitated   1. Acute metabolic encephalopathy of unclear etiology a. Previously reported as possible exacerbation of underlying dementia b. Seems improved today but does not appear at baseline. Awake and alert but oriented to self only c. EEG with mild diffuse encephalopathy d. CT head with mild chronic white matter ischemic changes but no acute changes e. UA unremarkable f. Noted to be more confused yesterday however upon med review she has received Ativan nightly in addition to Seroquel and melatonin so that this is likely contributing g. Hold Zyprexa h. Avoid further Ativan i. Discontinue melatonin j. Continue Risperdal for now k. SLP eval l. PT eval  2. Type 2 diabetes a. Continue Lantus and sliding scale  3. Hypertension a. Lopressor increased yesterday b. Continue amlodipine and enalapril c. As needed labetalol  4. Lactic acidosis, resolved  5. Hypokalemia, resolved a. Currently on potassium 40 mEq twice daily -stop for now follow-up in a.m. b. Check magnesium  6. Hyperlipidemia a. Continue statin  7. CAD a. Continue aspirin, Plavix, statin  DVT prophylaxis: enoxaparin (LOVENOX) injection 40 mg Start: 08/12/20 1515   Family Communication: no family at bedside  Disposition Plan:  Status is: Inpatient  Remains inpatient appropriate because:Altered mental status and Inpatient level of care appropriate due to severity of illness   Dispo: The patient is from: Home  Anticipated d/c is to: TBD              Anticipated d/c date is: 3 days              Patient currently is not medically stable to d/c.   Difficult to place patient No           Pressure injury documentation    None  Consultants  None  Procedures  EEG  Antibiotics   Anti-infectives (From admission, onward)   None        Subjective   Patient seen and examined at bedside no acute distress resting comfortably.  She is a poor historian  and is pleasantly confused.  Says that she just feels generally unwell but with no specific complaints.  Objective   Vitals:   08/15/20 0727 08/15/20 0830 08/15/20 1055 08/15/20 1506  BP: (!) 164/87  (!) 174/69 (!) 183/89  Pulse: 86 93 77 86  Resp: 20 17 20 20   Temp: 97.9 F (36.6 C)  98 F (36.7 C) 98.4 F (36.9 C)  TempSrc: Rectal  Oral Oral  SpO2: 100% 100% 99% 98%    Intake/Output Summary (Last 24 hours) at 08/15/2020 1718 Last data filed at 08/15/2020 1200 Gross per 24 hour  Intake 1820 ml  Output 1400 ml  Net 420 ml   There were no vitals filed for this visit.  Examination:  Physical Exam Vitals and nursing note reviewed.  Constitutional:      Appearance: Normal appearance.  HENT:     Head: Normocephalic and atraumatic.  Eyes:     Conjunctiva/sclera: Conjunctivae normal.  Cardiovascular:     Rate and Rhythm: Normal rate and regular rhythm.  Pulmonary:     Effort: Pulmonary effort is normal.     Breath sounds: Normal breath sounds.  Abdominal:     General: Abdomen is flat.     Palpations: Abdomen is soft.  Musculoskeletal:        General: No swelling or tenderness.  Skin:    Coloration: Skin is not jaundiced or pale.  Neurological:     Mental Status: She is alert. She is disoriented.     Comments: Awake alert oriented x1 to person  Psychiatric:        Mood and Affect: Mood normal.        Behavior: Behavior normal.     Data Reviewed: I have personally reviewed following labs and imaging studies  CBC: Recent Labs  Lab 08/12/20 0940 08/13/20 0439 08/15/20 0912  WBC 7.2 12.3* 7.8  NEUTROABS 4.5  --   --   HGB 14.5 13.6 13.9  HCT 43.3 40.9 40.2  MCV 89.8 88.1 88.2  PLT 233 267 [id number]   Basic Metabolic Panel: Recent Labs  Lab 08/12/20 0940 08/13/20 0439 08/14/20 0706 08/15/20 0912  NA 139 138 142 144  K 3.7 3.1* 2.8* 3.7  CL 102 102 105 112*  CO2 23 23 24 22   GLUCOSE 192* 168* 157* 166*  BUN 17 11 8 11   CREATININE 0.80 0.74 0.78 0.72   CALCIUM 9.7 9.6 9.5 9.6   GFR: CrCl cannot be calculated (Unknown ideal weight.). Liver Function Tests: Recent Labs  Lab 08/12/20 0940  AST 18  ALT 15  ALKPHOS 37*  BILITOT 0.6  PROT 7.2  ALBUMIN 3.8   No results for input(s): LIPASE, AMYLASE in the last 168 hours. Recent Labs  Lab 08/12/20 1427  AMMONIA 23   Coagulation Profile: No results for input(s): INR,  PROTIME in the last 168 hours. Cardiac Enzymes: No results for input(s): CKTOTAL, CKMB, CKMBINDEX, TROPONINI in the last 168 hours. BNP (last 3 results) No results for input(s): PROBNP in the last 8760 hours. HbA1C: No results for input(s): HGBA1C in the last 72 hours. CBG: Recent Labs  Lab 08/14/20 1704 08/14/20 2051 08/15/20 0815 08/15/20 1210 08/15/20 1532  GLUCAP 100* 150* 162* 137* 132*   Lipid Profile: No results for input(s): CHOL, HDL, LDLCALC, TRIG, CHOLHDL, LDLDIRECT in the last 72 hours. Thyroid Function Tests: Recent Labs    08/12/20 1833  TSH 0.820   Anemia Panel: No results for input(s): VITAMINB12, FOLATE, FERRITIN, TIBC, IRON, RETICCTPCT in the last 72 hours. Sepsis Labs: Recent Labs  Lab 08/12/20 0940 08/12/20 1903 08/12/20 2203  LATICACIDVEN 3.5* 2.0* 1.4    Recent Results (from the past 240 hour(s))  Culture, blood (Routine X 2) w Reflex to ID Panel     Status: None (Preliminary result)   Collection Time: 08/12/20  9:40 AM   Specimen: BLOOD  Result Value Ref Range Status   Specimen Description BLOOD RIGHT ANTECUBITAL  Final   Special Requests   Final    BOTTLES DRAWN AEROBIC AND ANAEROBIC Blood Culture adequate volume   Culture   Final    NO GROWTH 3 DAYS Performed at [facility], [place] [address], [place], [place] [address]    Report Status PENDING  Incomplete  Urine Culture     Status: Abnormal   Collection Time: 08/12/20 10:08 AM   Specimen: Urine, Random  Result Value Ref Range Status   Specimen Description URINE, RANDOM  Final   Special Requests   Final     NONE Performed at [facility], [place] [address], [place], [place] [address]    Culture (A)  Final    10,000 COLONIES/mL MULTIPLE SPECIES PRESENT, SUGGEST RECOLLECTION   Report Status 08/13/2020 FINAL  Final  SARS Coronavirus 2 by RT PCR (hospital order, performed in [facility] hospital lab) Nasopharyngeal     Status: None   Collection Time: 08/12/20 10:08 AM   Specimen: Nasopharyngeal  Result Value Ref Range Status   SARS Coronavirus 2 NEGATIVE NEGATIVE Final    Comment: (NOTE) SARS-CoV-2 target nucleic acids are NOT DETECTED.  The SARS-CoV-2 RNA is generally detectable in upper and lower respiratory specimens during the acute phase of infection. The lowest concentration of SARS-CoV-2 viral copies this assay can detect is 250 copies / mL. A negative result does not preclude SARS-CoV-2 infection and should not be used as the sole basis for treatment or other patient management decisions.  A negative result may occur with improper specimen collection / handling, submission of specimen other than nasopharyngeal swab, presence of viral mutation(s) within the areas targeted by this assay, and inadequate number of viral copies (<250 copies / mL). A negative result must be combined with clinical observations, patient history, and epidemiological information.  Fact Sheet for Patients:   StrictlyIdeas.no  Fact Sheet for Healthcare Providers: BankingDealers.co.za  This test is not yet approved or  cleared by the [place] FDA and has been authorized for detection and/or diagnosis of SARS-CoV-2 by FDA under an Emergency Use Authorization (EUA).  This EUA will remain in effect (meaning this test can be used) for the duration of the COVID-19 declaration under Section 564(b)(1) of the Act, 21 U.S.C. section 360bbb-3(b)(1), unless the authorization is terminated or revoked sooner.  Performed at [facility], [place] [address],  [place], [place] [address]   MRSA PCR Screening     Status: None   Collection Time: 08/12/20  5:04 PM   Specimen: Nasal Mucosa; Nasopharyngeal  Result Value Ref Range Status   MRSA by PCR NEGATIVE NEGATIVE Final    Comment:        The GeneXpert MRSA Assay (FDA approved for NASAL specimens only), is one component of a comprehensive MRSA colonization surveillance program. It is not intended to diagnose MRSA infection nor to guide or monitor treatment for MRSA infections. Performed at [facility], [place] [address], [place], [place] [address]   Culture, blood (Routine X 2) w Reflex to ID Panel     Status: None (Preliminary result)   Collection Time: 08/12/20  6:33 PM   Specimen: BLOOD  Result Value Ref Range Status   Specimen Description BLOOD LEFT ANTECUBITAL  Final   Special Requests   Final    BOTTLES DRAWN AEROBIC AND ANAEROBIC Blood Culture adequate volume   Culture   Final    NO GROWTH 3 DAYS Performed at [facility], [place] [address], [place], [place] [address]    Report Status PENDING  Incomplete         Radiology Studies: No results found.      Scheduled Meds: . amLODipine  10 mg Oral Daily  . aspirin EC  81 mg Oral Daily  . clopidogrel  75 mg Oral Daily  . diclofenac Sodium  2 g Topical BID  . enalapril  20 mg Oral BID  . enoxaparin (LOVENOX) injection  40 mg Subcutaneous Q24H  . fenofibrate  160 mg Oral Daily  . influenza vaccine adjuvanted  0.5 mL Intramuscular Tomorrow-1000  . insulin aspart  0-9 Units Subcutaneous TID WC  . insulin glargine  18 Units Subcutaneous Daily  . melatonin  5 mg Oral QHS  . metoprolol tartrate  50 mg Oral BID  . mometasone-formoterol  2 puff Inhalation BID  . pantoprazole  40 mg Oral Daily  . potassium chloride SA  40 mEq Oral BID  . risperiDONE  1 mg Oral BID  . rosuvastatin  40 mg Oral Daily  . sertraline  100 mg Oral QHS  . sodium chloride flush  3 mL Intravenous Q12H  . umeclidinium bromide  1 puff Inhalation  Daily   Continuous Infusions: . sodium chloride 75 mL/hr at 08/14/20 1900     Time spent: 26 minutes with over 50% of the time coordinating the patient's care    Harold Hedge, DO [facility]   Call night coverage person covering after 7pm

## 2020-08-16 DIAGNOSIS — I10 Essential (primary) hypertension: Secondary | ICD-10-CM | POA: Diagnosis not present

## 2020-08-16 LAB — GLUCOSE, CAPILLARY
Glucose-Capillary: 142 mg/dL — ABNORMAL HIGH (ref 70–99)
Glucose-Capillary: 121 mg/dL — ABNORMAL HIGH (ref 70–99)
Glucose-Capillary: 127 mg/dL — ABNORMAL HIGH (ref 70–99)
Glucose-Capillary: 121 mg/dL — ABNORMAL HIGH (ref 70–99)

## 2020-08-16 MED ORDER — RISPERIDONE 0.5 MG PO TBDP
0.5000 mg | ORAL_TABLET | Freq: Two times a day (BID) | ORAL | Status: DC
  Administered 2020-08-16 – 2020-08-17 (×3): 0.5 mg via ORAL
  Filled 2020-08-16 (×4): qty 1

## 2020-08-16 MED ORDER — HYDRALAZINE HCL 25 MG PO TABS
25.0000 mg | ORAL_TABLET | Freq: Three times a day (TID) | ORAL | Status: DC
  Administered 2020-08-16 – 2020-08-19 (×9): 25 mg via ORAL
  Filled 2020-08-16 (×9): qty 1

## 2020-08-16 NOTE — Evaluation (Addendum)
Clinical/Bedside Swallow Evaluation Patient Details  Name: Michelle [name] MRN: [id number] Date of Birth: 04/21/1933  Today's Date: 08/16/2020 Time: SLP Start Time (ACUTE ONLY): 1062 SLP Stop Time (ACUTE ONLY): 1713 SLP Time Calculation (min) (ACUTE ONLY): 22 min  Past Medical History:  Past Medical History:  Diagnosis Date  . Anginal pain ([place])   . Arthritis   . AV block, 1st degree   . CAD (coronary artery disease) 1990; 2015   Cardiac cath 1990 with Dr. Lia Foyer and [name] Residual CAD, non obstructive per 2015 cath. STEMI status post stent in August 2015.  Marland Kitchen COPD (chronic obstructive pulmonary disease) ([place])   . Diabetes mellitus type 2, insulin dependent ([place])   . Essential hypertension   . Hyperlipidemia with target LDL less than 70   . Myocardial infarction ([place])   . Pericarditis-post MI (short course of steroids) 03/06/2014  . 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 Shortness of breath    Past Surgical History:  Past Surgical History:  Procedure Laterality Date  . CATARACT EXTRACTION  2020   right and left eye   . CHOLECYSTECTOMY    . CORONARY ANGIOPLASTY WITH STENT PLACEMENT  02/18/14   Promus DES to RCA  . LEFT HEART CATHETERIZATION WITH CORONARY ANGIOGRAM N/A 02/18/2014   Procedure: LEFT HEART CATHETERIZATION WITH CORONARY ANGIOGRAM;  Surgeon: Jettie Booze, MD;  Location: [facility] CATH LAB;  Service: Cardiovascular;  Laterality: N/A;  . PTCA  1990   PTCA of RCA  . TRANSTHORACIC ECHOCARDIOGRAM  02/02/2012   mild LVH, EF 55-60%, Normal WM, Gr 1 DD; Mild MR   HPI:  This is an 87 year old female with a past medical history of type 2 diabetes, hypertension, hyperlipidemia, CAD, MI who presented to the ED on 1/26 with complaints of altered mental  status. Head CT and CXR 1/26 without acute findings   Assessment / Plan / Recommendation Clinical Impression  Pt presents with at least a moderate oral dysphagia.  Pt was very reluctant to accept bolus trials and would not take puree, thin liquid, or solid from SLP.  Son reports that pt has been having a difficult time chewing at home 2/2 dental status, but they have been unable to get dentures for her.  Son is hopeful to find a way to improve her intake.  RN reports oral holding and and some coughing, but this often occurs while she is talking while eating.  Despite encouragement, pt would not accept bolus trials from SLP.  RN was able to administer trials of thin liquid and puree.  Pt appeared to tolerate these consistencise and hopefully softer foods wil make PO intake easier for pt.  Pt may be able to tolerate more advanced textures with improvement in mentation/LOA.    Recommend puree diet with thin liquid. SLP to follow for tolerance and possible upgrade.  SLP Visit Diagnosis: Dysphagia, oral phase (R13.11)    Aspiration Risk  Mild aspiration risk    Diet Recommendation Dysphagia 1 (Puree);Thin liquid   Liquid Administration via: Straw Medication Administration: Crushed with puree Supervision: Full supervision/cueing for compensatory strategies;Staff to assist with self feeding Compensations: Slow rate;Small sips/bites Postural Changes: Seated upright at 90 degrees    Other  Recommendations     Follow  up Recommendations  (TBD)      Frequency and Duration min 2x/week  2 weeks       Prognosis Prognosis for Safe Diet Advancement: Fair      Swallow Study   General HPI: This is an 87 year old female with a past medical history of type 2 diabetes, hypertension, hyperlipidemia, CAD, MI who presented to the ED on 1/26 with complaints of altered mental status. Head CT and CXR 1/26 without acute findings Type of Study: Bedside Swallow Evaluation Previous Swallow Assessment:  none Diet Prior to this Study: Regular History of Recent Intubation: No Behavior/Cognition: Uncooperative;Lethargic/Drowsy Oral Cavity Assessment:  (unable to assess) Oral Care Completed by SLP: No Oral Cavity - Dentition: Edentulous;Poor condition;Missing dentition Self-Feeding Abilities: Total assist Patient Positioning: Upright in bed Baseline Vocal Quality: Normal Volitional Cough: Cognitively unable to elicit Volitional Swallow: Unable to elicit    Oral/Motor/Sensory Function Overall Oral Motor/Sensory Function: Mild impairment Facial ROM: Reduced right;Reduced left Facial Symmetry: Within Functional Limits Lingual ROM: Reduced right;Reduced left Lingual Symmetry:  (could not assess) Lingual Strength:  (could not test) Velum:  (could not test) Mandible:  (reduced)   Ice Chips Ice chips: Not tested   Thin Liquid Thin Liquid: Within functional limits Presentation: Straw    Nectar Thick Nectar Thick Liquid: Not tested   Honey Thick Honey Thick Liquid: Not tested   Puree Puree: Within functional limits Presentation: Spoon   Solid     Solid: Impaired Oral Phase Impairments: Impaired mastication Oral Phase Functional Implications: Prolonged oral transit;Oral holding      Celedonio Savage, [place], [place] Office: [phone] 08/16/2020,5:36 PM

## 2020-08-16 NOTE — Evaluation (Signed)
Physical Therapy Evaluation Patient Details Name: Michelle [name] MRN: [id number] DOB: [birth year] Today's Date: 08/16/2020   History of Present Illness  87 year old female with a past medical history of type 2 diabetes, hypertension, hyperlipidemia, CAD, MI who presented to the ED on 1/26 with complaints of altered mental status  Clinical Impression  Orders received for PT evaluation. Patient demonstrates deficits in functional mobility as indicated below. Will benefit from continued skilled PT to address deficits and maximize function.   At this time, patient unable to fully engage in session. Patient son present throughout and reports at baseline, patient was able to mobilize with rollator and limited assist (mainly for bathing). At this time, patient with continued confusion and bouts of lethargy throughout. Of note, patient will limited self awareness and no indication or response to pain.  In conversation with son, plan would be for mom to return home once at a safe level to do so. At this time, feel patient will need short term post acute rehabilitation to maximize recovery of function and decrease burden of care. Son in agreement with plan. Will see as indicated and progress as tolerated.  Care team be advised, patient would like to speak with case manager. If possible please coordinate contact with sonLouie Casa, as able.     Follow Up Recommendations SNF;Supervision/Assistance - 24 hour    Equipment Recommendations   (TBD)    Recommendations for Other Services  (may consider wound care consult if MD agrees SW:HQPR region)     Precautions / Restrictions Precautions Precautions: Fall;Other (comment) Precaution Comments: restraints active bilateral wrist      Mobility  Bed Mobility Overal bed mobility: Needs Assistance Bed Mobility: Rolling;Supine to Sit Rolling: Max assist;+2 for physical assistance   Supine to sit: Max assist;+2 for physical assistance (attempted to initiate  transitional movement but unable to engage or complete)     General bed mobility comments: Paitent initially with some limited engagement and participation but overall could not sustain engagement. Patient required +2 increased physical assist for all aspects of mobility and repositioning. Some active movement and protective response in sidelying but otherwise dependent.    Transfers                 General transfer comment: unable to assess at this time  Ambulation/Gait                Stairs            Wheelchair Mobility    Modified Rankin (Stroke Patients Only)       Balance Overall balance assessment:  (defer assessment at this time)                                           Pertinent Vitals/Pain Pain Assessment: Faces Faces Pain Scale: No hurt Pain Descriptors / Indicators:  (Patient reports no pain, and no evidence of distress, however, given nature of mobility as well as concerns for wound/erythema - concerning that patient is not experienceing pain related to current condition.) Pain Intervention(s):  (while no evidence or report of pain, precautionary measures  or hygiene/pericare/barrier crea/ dressing change/ and repositioning taken during hygiene and pericare to address areas evident of irratation and erythema.)    Home Living Family/patient expects to be discharged to:: Private residence (open to [facility] for rehab) Living Arrangements: Children Available Help at Discharge: Family Type  of Home: House Home Access: Stairs to enter Entrance Stairs-Rails: Right Entrance Stairs-Number of Steps: 4 Home Layout: One level Home Equipment: Walker - 4 wheels      Prior Function Level of Independence: Needs assistance   Gait / Transfers Assistance Needed: patient with supervision and use of rollator, per son no recent falls, does require help with funstional ADLS and stair negotation at baseline           Hand Dominance         Extremity/Trunk Assessment   Upper Extremity Assessment Upper Extremity Assessment: Difficult to assess due to impaired cognition    Lower Extremity Assessment Lower Extremity Assessment: Generalized weakness;Difficult to assess due to impaired cognition (noted + arthritic changes and gross weakness bilaterally)    Cervical / Trunk Assessment Cervical / Trunk Assessment: Kyphotic  Communication   Communication: Other (comment) (impared with some language of confusion but intermittent appropriate responses, difficult to fully assess due to continued AMS)  Cognition Arousal/Alertness:  (fluctuation between alert and lathargic) Behavior During Therapy: Flat affect Overall Cognitive Status: Impaired/Different from baseline Area of Impairment: Attention;Safety/judgement;Awareness;Problem solving                   Current Attention Level: Focused     Safety/Judgement: Decreased awareness of safety;Decreased awareness of deficits Awareness: Intellectual Problem Solving: Slow processing;Decreased initiation;Difficulty sequencing;Requires verbal cues;Requires tactile cues General Comments: patient with overall confusion throughout session, responds to simple commands intermittently and gave correct appropriate responses to some home set up information, but other historical information as well as various conversation points riddled with confusion as confirmed by son. Redirect partially beneficial.      General Comments General comments (skin integrity, edema, etc.): noted irriation and ulcerative process around peri region with erthyema and warmth, nsg aware. Some emaciation, barrier cream applied. Patient appears unaffected or aware of issue.    Exercises     Assessment/Plan    PT Assessment Patient needs continued PT services  PT Problem List Decreased strength;Decreased activity tolerance;Decreased balance;Decreased mobility;Decreased cognition;Decreased safety  awareness;Decreased skin integrity       PT Treatment Interventions DME instruction;Functional mobility training;Therapeutic activities;Therapeutic exercise;Balance training;Neuromuscular re-education;Patient/family education;Cognitive remediation;Gait training;Stair training    PT Goals (Current goals can be found in the Care Plan section)  Acute Rehab PT Goals Patient Stated Goal: to take his mom home once she can do so safely PT Goal Formulation: With family Time For Goal Achievement: 08/30/20 Potential to Achieve Goals: Fair    Frequency Min 2X/week   Barriers to discharge Decreased caregiver support;Inaccessible home environment at this time, patient unlikely to be able to navigate stairs or mobilize safely despite caregiver assist    Co-evaluation               AM-PAC PT "6 Clicks" Mobility  Outcome Measure Help needed turning from your back to your side while in a flat bed without using bedrails?: A Lot Help needed moving from lying on your back to sitting on the side of a flat bed without using bedrails?: A Lot Help needed moving to and from a bed to a chair (including a wheelchair)?: A Lot Help needed standing up from a chair using your arms (e.g., wheelchair or bedside chair)?: Total Help needed to walk in hospital room?: Total Help needed climbing 3-5 steps with a railing? : Total 6 Click Score: 9    End of Session   Activity Tolerance: Patient limited by fatigue;Patient limited by lethargy;Other (comment) (safety) Patient  left: in bed;with call bell/phone within reach;with bed alarm set;with family/visitor present;with restraints reapplied Nurse Communication: Mobility status (incontinent, peri-region wound) PT Visit Diagnosis: Difficulty in walking, not elsewhere classified (R26.2);Muscle weakness (generalized) (M62.81);Other symptoms and signs involving the nervous system (R29.898)    Time: 9024-0973 PT Time Calculation (min) (ACUTE ONLY): 23  min   Charges:   PT Evaluation $PT Eval Moderate Complexity: 1 Mod PT Treatments $Therapeutic Activity: 8-22 mins        Alben Deeds, PT DPT  Board Certified Neurologic Specialist Acute Rehabilitation Services Office [place] 08/16/2020, 12:39 PM

## 2020-08-16 NOTE — Progress Notes (Signed)
PROGRESS NOTE    Michelle [name]    Code Status: Full Code  [id number] DOB: 20-Mar-1933 DOA: 08/12/2020 LOS: 3 days  PCP: [name], MD CC:  Chief Complaint  Patient presents with  . Altered Mental Status       Hospital Summary   This is an 87 year old female with a past medical history of type 2 diabetes, hypertension, hyperlipidemia, CAD, MI who presented to the ED on 1/26 with complaints of altered mental status.  Per Dr. Reggy Eye note 08/13/2020: Per HPI:  History is obtained from the son over the phone. He heard his mother snoring very loudly today and went in the room to check on her. When he went into the room to check on her found her with her eyes open not responding and drooling of the side of her mouth. Her teeth were very clenched and he immediately called 911. No lossof bowel or bladder noted by the patient's son. Patient reports that she is not aware of why she is here today and has no recollection of the events leading up to her hospitalization. Currently she complains of right shoulder and back pain. Son package pills for her dailyand states that her primary care provider said that she could take 3 Tylenol 500 mg 3 times a day with tramadol if needed for pain. The only recent changes that they increased her spironolactone 12.5 mg daily to 25 mg.   Of note: She was last admitted in 12/21 with delirium and work up was unrevealing. If was felt that uncontrolled BP was contributing to her delirium and that she had underlying dementia.   1/27: EEG suggestive of mild diffuse encephalopathy and nonspecific etiology with no seizures or epileptiform discharges.  She was started on Zyprexa as needed for agitation during the day and Risperdal twice daily to reduce overall behavioral issues and restlessness.     A & P   Principal Problem:   Acute encephalopathy Active Problems:   IDDM type 2   Essential hypertension   Back pain, lumbosacral   Lactic  acidosis   Hyperlipidemia   Agitated   1. Acute metabolic encephalopathy of unclear etiology a. Previously reported as possible exacerbation of underlying dementia and possibly hypertension and worsened by medications (Ativan, Haldol, melatonin) b. Improved today, oriented x3 but son at bedside reports she is not at baseline c. EEG with mild diffuse encephalopathy d. CT head with mild chronic white matter ischemic changes but no acute changes e. UA unremarkable f. Hold Zyprexa g. Avoid further Ativan and Haldol h. Discontinued melatonin i. Reduce Risperdal to 0.5 mg twice daily j. SLP eval k. PT eval -SNF  2. Type 2 diabetes, stable a. Continue Lantus and sliding scale  3. Hypertension, poorly controlled a. Currently on Lopressor 50 mg twice daily, amlodipine 10 mg enalapril 20 mg twice daily b. Add hydralazine 25 mg 3 times daily c. May change Lopressor to Coreg for better antihypertensive control d. As needed labetalol  4. Lactic acidosis, resolved  5. Hypokalemia, resolved a. Currently on potassium 40 mEq twice daily -stop for now follow-up in a.m.  6. Hyperlipidemia a. Continue statin  7. CAD a. Continue aspirin, Plavix, statin  DVT prophylaxis: enoxaparin (LOVENOX) injection 40 mg Start: 08/12/20 1515   Family Communication: Son at bedside  Disposition Plan: Pending improved mental status and SNF availability.  Hopefully medically stable in 48 to 72 hours Status is: Inpatient  Remains inpatient appropriate because:Altered mental status and Inpatient level of care  appropriate due to severity of illness   Dispo: The patient is from: Home              Anticipated d/c is to: SNF              Anticipated d/c date is: 3 days              Patient currently is not medically stable to d/c.   Difficult to place patient No           Pressure injury documentation    None  Consultants  None  Procedures  EEG  Antibiotics   Anti-infectives (From  admission, onward)   None        Subjective   Patient currently resting comfortably.  Son at bedside.  Patient appeared a bit drowsy but was much more alert today and oriented x3 and denied any complaints currently.  Son does not feel she is back to her normal and says she got very lethargic with physical therapy this afternoon and had a bowel movement.  PT reported patient was unable to fully engage in the session due to continued confusion and bouts of lethargy throughout with limited self-awareness and no response to pain.  Hospitalist was not immediately made aware of this.  Objective   Vitals:   08/16/20 1029 08/16/20 1600 08/16/20 1615 08/16/20 1630  BP: (!) 167/72 (!) 152/81 (!) 165/77 (!) 182/90  Pulse: 79 84 83 84  Resp: 18 (!) 27 (!) 24 (!) 22  Temp: 97.8 F (36.6 C)     TempSrc: Oral     SpO2: 98% 98% 99% 97%    Intake/Output Summary (Last 24 hours) at 08/16/2020 1736 Last data filed at 08/16/2020 1215 Gross per 24 hour  Intake 360 ml  Output 300 ml  Net 60 ml   There were no vitals filed for this visit.  Examination:  Physical Exam Vitals and nursing note reviewed.  Constitutional:      Appearance: Normal appearance.  HENT:     Head: Normocephalic and atraumatic.  Eyes:     Conjunctiva/sclera: Conjunctivae normal.  Cardiovascular:     Rate and Rhythm: Normal rate and regular rhythm.  Pulmonary:     Effort: Pulmonary effort is normal.     Breath sounds: Normal breath sounds.  Abdominal:     General: Abdomen is flat.     Palpations: Abdomen is soft.  Musculoskeletal:        General: No swelling or tenderness.  Skin:    Coloration: Skin is not jaundiced or pale.  Neurological:     Mental Status: She is alert and oriented to person, place, and time.  Psychiatric:        Mood and Affect: Mood normal.        Behavior: Behavior normal.     Data Reviewed: I have personally reviewed following labs and imaging studies  CBC: Recent Labs  Lab  08/12/20 0940 08/13/20 0439 08/15/20 0912  WBC 7.2 12.3* 7.8  NEUTROABS 4.5  --   --   HGB 14.5 13.6 13.9  HCT 43.3 40.9 40.2  MCV 89.8 88.1 88.2  PLT 233 267 810   Basic Metabolic Panel: Recent Labs  Lab 08/12/20 0940 08/13/20 0439 08/14/20 0706 08/15/20 0912 08/15/20 1754  NA 139 138 142 144  --   K 3.7 3.1* 2.8* 3.7  --   CL 102 102 105 112*  --   CO2 23 23 24 22   --  GLUCOSE 192* 168* 157* 166*  --   BUN 17 11 8 11   --   CREATININE 0.80 0.74 0.78 0.72  --   CALCIUM 9.7 9.6 9.5 9.6  --   MG  --   --   --   --  1.8   GFR: CrCl cannot be calculated (Unknown ideal weight.). Liver Function Tests: Recent Labs  Lab 08/12/20 0940  AST 18  ALT 15  ALKPHOS 37*  BILITOT 0.6  PROT 7.2  ALBUMIN 3.8   No results for input(s): LIPASE, AMYLASE in the last 168 hours. Recent Labs  Lab 08/12/20 1427  AMMONIA 23   Coagulation Profile: No results for input(s): INR, PROTIME in the last 168 hours. Cardiac Enzymes: No results for input(s): CKTOTAL, CKMB, CKMBINDEX, TROPONINI in the last 168 hours. BNP (last 3 results) No results for input(s): PROBNP in the last 8760 hours. HbA1C: No results for input(s): HGBA1C in the last 72 hours. CBG: Recent Labs  Lab 08/15/20 1532 08/15/20 2107 08/16/20 0805 08/16/20 1217 08/16/20 1605  GLUCAP 132* 147* 142* 121* 127*   Lipid Profile: No results for input(s): CHOL, HDL, LDLCALC, TRIG, CHOLHDL, LDLDIRECT in the last 72 hours. Thyroid Function Tests: No results for input(s): TSH, T4TOTAL, FREET4, T3FREE, THYROIDAB in the last 72 hours. Anemia Panel: No results for input(s): VITAMINB12, FOLATE, FERRITIN, TIBC, IRON, RETICCTPCT in the last 72 hours. Sepsis Labs: Recent Labs  Lab 08/12/20 0940 08/12/20 1903 08/12/20 2203  LATICACIDVEN 3.5* 2.0* 1.4    Recent Results (from the past 240 hour(s))  Culture, blood (Routine X 2) w Reflex to ID Panel     Status: None (Preliminary result)   Collection Time: 08/12/20  9:40 AM    Specimen: BLOOD  Result Value Ref Range Status   Specimen Description BLOOD RIGHT ANTECUBITAL  Final   Special Requests   Final    BOTTLES DRAWN AEROBIC AND ANAEROBIC Blood Culture adequate volume   Culture   Final    NO GROWTH 4 DAYS Performed at [facility], [place] [address], [place], [place] [address]    Report Status PENDING  Incomplete  Urine Culture     Status: Abnormal   Collection Time: 08/12/20 10:08 AM   Specimen: Urine, Random  Result Value Ref Range Status   Specimen Description URINE, RANDOM  Final   Special Requests   Final    NONE Performed at [facility], [place] [address] [place],  [address]    Culture (A)  Final    10,000 COLONIES/mL MULTIPLE SPECIES PRESENT, SUGGEST RECOLLECTION   Report Status 08/13/2020 FINAL  Final  SARS Coronavirus 2 by RT PCR (hospital order, performed in [facility] hospital lab) Nasopharyngeal     Status: None   Collection Time: 08/12/20 10:08 AM   Specimen: Nasopharyngeal  Result Value Ref Range Status   SARS Coronavirus 2 NEGATIVE NEGATIVE Final    Comment: (NOTE) SARS-CoV-2 target nucleic acids are NOT DETECTED.  The SARS-CoV-2 RNA is generally detectable in upper and lower respiratory specimens during the acute phase of infection. The lowest concentration of SARS-CoV-2 viral copies this assay can detect is 250 copies / mL. A negative result does not preclude SARS-CoV-2 infection and should not be used as the sole basis for treatment or other patient management decisions.  A negative result may occur with improper specimen collection / handling, submission of specimen other than nasopharyngeal swab, presence of viral mutation(s) within the areas targeted by this assay, and inadequate  number of viral copies (<250 copies / mL). A negative result must be combined with clinical observations, patient history, and epidemiological information.  Fact Sheet for Patients:    StrictlyIdeas.no  Fact Sheet for Healthcare Providers: BankingDealers.co.za  This test is not yet approved or  cleared by the [place] FDA and has been authorized for detection and/or diagnosis of SARS-CoV-2 by FDA under an Emergency Use Authorization (EUA).  This EUA will remain in effect (meaning this test can be used) for the duration of the COVID-19 declaration under Section 564(b)(1) of the Act, 21 U.S.C. section 360bbb-3(b)(1), unless the authorization is terminated or revoked sooner.  Performed at [facility], [place] [address], [place], [place] [address]   MRSA PCR Screening     Status: None   Collection Time: 08/12/20  5:04 PM   Specimen: Nasal Mucosa; Nasopharyngeal  Result Value Ref Range Status   MRSA by PCR NEGATIVE NEGATIVE Final    Comment:        The GeneXpert MRSA Assay (FDA approved for NASAL specimens only), is one component of a comprehensive MRSA colonization surveillance program. It is not intended to diagnose MRSA infection nor to guide or monitor treatment for MRSA infections. Performed at [facility], [place] [address] [place], [place] [address]   Culture, blood (Routine X 2) w Reflex to ID Panel     Status: None (Preliminary result)   Collection Time: 08/12/20  6:33 PM   Specimen: BLOOD  Result Value Ref Range Status   Specimen Description BLOOD LEFT ANTECUBITAL  Final   Special Requests   Final    BOTTLES DRAWN AEROBIC AND ANAEROBIC Blood Culture adequate volume   Culture   Final    NO GROWTH 4 DAYS Performed at [facility], [place] [address], [place], [place] [address]    Report Status PENDING  Incomplete         Radiology Studies: No results found.      Scheduled Meds: . amLODipine  10 mg Oral Daily  . aspirin EC  81 mg Oral Daily  . clopidogrel  75 mg Oral Daily  . diclofenac Sodium  2 g Topical BID  . enalapril  20 mg Oral BID  . enoxaparin (LOVENOX)  injection  40 mg Subcutaneous Q24H  . fenofibrate  160 mg Oral Daily  . influenza vaccine adjuvanted  0.5 mL Intramuscular Tomorrow-1000  . insulin aspart  0-9 Units Subcutaneous TID WC  . insulin glargine  18 Units Subcutaneous Daily  . metoprolol tartrate  50 mg Oral BID  . mometasone-formoterol  2 puff Inhalation BID  . pantoprazole  40 mg Oral Daily  . risperiDONE  1 mg Oral BID  . rosuvastatin  40 mg Oral Daily  . sertraline  100 mg Oral QHS  . sodium chloride flush  3 mL Intravenous Q12H  . umeclidinium bromide  1 puff Inhalation Daily   Continuous Infusions:    Time spent: 28 minutes with over 50% of the time coordinating the patient's care    Harold Hedge, DO [facility]   Call night coverage person covering after 7pm

## 2020-08-17 DIAGNOSIS — I10 Essential (primary) hypertension: Secondary | ICD-10-CM | POA: Diagnosis not present

## 2020-08-17 LAB — CBC
HCT: 41.3 % (ref 36.0–46.0)
Hemoglobin: 14 g/dL (ref 12.0–15.0)
MCH: 30.4 pg (ref 26.0–34.0)
MCHC: 33.9 g/dL (ref 30.0–36.0)
MCV: 89.6 fL (ref 80.0–100.0)
Platelets: 262 10*3/uL (ref 150–400)
RBC: 4.61 MIL/uL (ref 3.87–5.11)
RDW: 13.1 % (ref 11.5–15.5)
WBC: 9.3 10*3/uL (ref 4.0–10.5)
nRBC: 0 % (ref 0.0–0.2)

## 2020-08-17 LAB — BASIC METABOLIC PANEL
Anion gap: 13 (ref 5–15)
BUN: 21 mg/dL (ref 8–23)
CO2: 21 mmol/L — ABNORMAL LOW (ref 22–32)
Calcium: 9.5 mg/dL (ref 8.9–10.3)
Chloride: 109 mmol/L (ref 98–111)
Creatinine, Ser: 0.78 mg/dL (ref 0.44–1.00)
GFR, Estimated: >60 mL/min (ref >60)
Glucose, Bld: 128 mg/dL — ABNORMAL HIGH (ref 70–99)
Potassium: 3.1 mmol/L — ABNORMAL LOW (ref 3.5–5.1)
Sodium: 143 mmol/L (ref 135–145)

## 2020-08-17 LAB — CULTURE, BLOOD (ROUTINE X 2)
Culture: NO GROWTH
Culture: NO GROWTH
Special Requests: ADEQUATE
Special Requests: ADEQUATE

## 2020-08-17 LAB — GLUCOSE, CAPILLARY
Glucose-Capillary: 134 mg/dL — ABNORMAL HIGH (ref 70–99)
Glucose-Capillary: 224 mg/dL — ABNORMAL HIGH (ref 70–99)
Glucose-Capillary: 176 mg/dL — ABNORMAL HIGH (ref 70–99)
Glucose-Capillary: 194 mg/dL — ABNORMAL HIGH (ref 70–99)

## 2020-08-17 MED ORDER — GERHARDT'S BUTT CREAM
TOPICAL_CREAM | Freq: Three times a day (TID) | CUTANEOUS | Status: DC
  Administered 2020-08-28 – 2020-08-30 (×3): 1 via TOPICAL
  Filled 2020-08-17: qty 1

## 2020-08-17 MED ORDER — POTASSIUM CHLORIDE CRYS ER 20 MEQ PO TBCR
40.0000 meq | EXTENDED_RELEASE_TABLET | Freq: Once | ORAL | Status: AC
  Administered 2020-08-17: 40 meq via ORAL
  Filled 2020-08-17: qty 2

## 2020-08-17 NOTE — Care Management Important Message (Signed)
Important Message  Patient Details  Name: Michelle [name] MRN: [id number] Date of Birth: 10-06-1932   Medicare Important Message Given:  Yes     Enisa Runyan [address] 08/17/2020, 3:45 PM

## 2020-08-17 NOTE — Progress Notes (Signed)
PROGRESS NOTE    Michelle [name]    Code Status: Full Code  KGM:[id number] DOB: 1932-11-25 DOA: 08/12/2020 LOS: 4 days  PCP: [name], MD CC:  Chief Complaint  Patient presents with  . Altered Mental Status       Hospital Summary   This is an 87 year old female with a past medical history of type 2 diabetes, hypertension, hyperlipidemia, CAD, MI who presented to the ED on 1/26 with complaints of altered mental status.  Per Dr. Reggy Eye note 08/13/2020: Per HPI:  History is obtained from the son over the phone. He heard his mother snoring very loudly today and went in the room to check on her. When he went into the room to check on her found her with her eyes open not responding and drooling of the side of her mouth. Her teeth were very clenched and he immediately called 911. No lossof bowel or bladder noted by the patient's son. Patient reports that she is not aware of why she is here today and has no recollection of the events leading up to her hospitalization. Currently she complains of right shoulder and back pain. Son package pills for her dailyand states that her primary care provider said that she could take 3 Tylenol 500 mg 3 times a day with tramadol if needed for pain. The only recent changes that they increased her spironolactone 12.5 mg daily to 25 mg.   Of note: She was last admitted in 12/21 with delirium and work up was unrevealing. If was felt that uncontrolled BP was contributing to her delirium and that she had underlying dementia.   1/27: EEG suggestive of mild diffuse encephalopathy and nonspecific etiology with no seizures or epileptiform discharges.  She was started on Zyprexa as needed for agitation during the day and Risperdal twice daily to reduce overall behavioral issues and restlessness.     A & P   Principal Problem:   Acute encephalopathy Active Problems:   IDDM type 2   Essential hypertension   Back pain, lumbosacral   Lactic  acidosis   Hyperlipidemia   Agitated   1. Acute metabolic encephalopathy of unclear etiology, possibly delirium in the setting of dementia a. Previously reported as possible exacerbation of underlying dementia and possibly hypertension and worsened by medications (Ativan, Haldol, melatonin) b. Awake and alert and interactive but pleasantly confused, not at baseline c. EEG with mild diffuse encephalopathy d. CT head with mild chronic white matter ischemic changes but no acute changes e. UA unremarkable f. Hold Zyprexa g. Avoid further Ativan and Haldol h. Discontinued melatonin i. Reduced Risperdal to 0.5 mg twice daily j. SLP eval k. PT eval -SNF  2. Type 2 diabetes, stable a. Continue Lantus and sliding scale  3. Hypertension, improved a. Currently on Lopressor 50 mg twice daily, amlodipine 10 mg enalapril 20 mg twice daily b. Added hydralazine 25 mg 3 times daily with improved BPs c. Given her hypokalemia and resistant hypertension, hyperaldosteronism is on the differential d. Will check Aldosterone and Renin activity and ratio tomorrow AM. Of note she is already on an ACE inhibitor which can effect this lab value e. As needed labetalol  4. Lactic acidosis, resolved  5. Hypokalemia a. replete  6. Hyperlipidemia a. Continue statin  7. CAD a. Continue aspirin, Plavix, statin  DVT prophylaxis: enoxaparin (LOVENOX) injection 40 mg Start: 08/12/20 1515   Family Communication: Son at bedside today  Disposition Plan: Pending improved mental status and SNF availability.  Hopefully  medically stable in 48 to 72 hours Status is: Inpatient  Remains inpatient appropriate because:Altered mental status and Inpatient level of care appropriate due to severity of illness   Dispo: The patient is from: Home              Anticipated d/c is to: SNF              Anticipated d/c date is: 3 days              Patient currently is not medically stable to d/c.   Difficult to place patient  No           Pressure injury documentation    None  Consultants  None  Procedures  EEG  Antibiotics   Anti-infectives (From admission, onward)   None        Subjective   Patient resting comfortably.  She denies any complaints at this time.  Son at bedside states she is not at her baseline and is typically oriented x3 at home and relatively independent.  No other issues overnight  Objective   Vitals:   08/17/20 0832 08/17/20 0851 08/17/20 1051 08/17/20 1435  BP: (!) 172/80  (!) 189/80 (!) 149/71  Pulse: 83  93 75  Resp: (!) 23  20 14   Temp: 98.5 F (36.9 C)  98.6 F (37 C) 98.5 F (36.9 C)  TempSrc: Oral  Oral Oral  SpO2: 100% 98% 99% 95%    Intake/Output Summary (Last 24 hours) at 08/17/2020 1536 Last data filed at 08/17/2020 1430 Gross per 24 hour  Intake 480 ml  Output 150 ml  Net 330 ml   There were no vitals filed for this visit.  Examination:  Physical Exam Vitals and nursing note reviewed.  Constitutional:      Appearance: Normal appearance.  HENT:     Head: Normocephalic and atraumatic.  Eyes:     Conjunctiva/sclera: Conjunctivae normal.  Cardiovascular:     Rate and Rhythm: Normal rate and regular rhythm.  Pulmonary:     Effort: Pulmonary effort is normal.     Breath sounds: Normal breath sounds.  Abdominal:     General: Abdomen is flat.     Palpations: Abdomen is soft.  Musculoskeletal:        General: No swelling or tenderness.  Skin:    Coloration: Skin is not jaundiced or pale.  Neurological:     Mental Status: She is alert. She is disoriented.  Psychiatric:        Mood and Affect: Mood normal.        Behavior: Behavior normal.     Data Reviewed: I have personally reviewed following labs and imaging studies  CBC: Recent Labs  Lab 08/12/20 0940 08/13/20 0439 08/15/20 0912 08/17/20 0301  WBC 7.2 12.3* 7.8 9.3  NEUTROABS 4.5  --   --   --   HGB 14.5 13.6 13.9 14.0  HCT 43.3 40.9 40.2 41.3  MCV 89.8 88.1 88.2  89.6  PLT 233 267 257 378   Basic Metabolic Panel: Recent Labs  Lab 08/12/20 0940 08/13/20 0439 08/14/20 0706 08/15/20 0912 08/15/20 1754 08/17/20 0301  NA 139 138 142 144  --  143  K 3.7 3.1* 2.8* 3.7  --  3.1*  CL 102 102 105 112*  --  109  CO2 23 23 24 22   --  21*  GLUCOSE 192* 168* 157* 166*  --  128*  BUN 17 11 8 11   --  21  CREATININE 0.80 0.74 0.78 0.72  --  0.78  CALCIUM 9.7 9.6 9.5 9.6  --  9.5  MG  --   --   --   --  1.8  --    GFR: CrCl cannot be calculated (Unknown ideal weight.). Liver Function Tests: Recent Labs  Lab 08/12/20 0940  AST 18  ALT 15  ALKPHOS 37*  BILITOT 0.6  PROT 7.2  ALBUMIN 3.8   No results for input(s): LIPASE, AMYLASE in the last 168 hours. Recent Labs  Lab 08/12/20 1427  AMMONIA 23   Coagulation Profile: No results for input(s): INR, PROTIME in the last 168 hours. Cardiac Enzymes: No results for input(s): CKTOTAL, CKMB, CKMBINDEX, TROPONINI in the last 168 hours. BNP (last 3 results) No results for input(s): PROBNP in the last 8760 hours. HbA1C: No results for input(s): HGBA1C in the last 72 hours. CBG: Recent Labs  Lab 08/16/20 1217 08/16/20 1605 08/16/20 2102 08/17/20 0840 08/17/20 1152  GLUCAP 121* 127* 121* 134* 224*   Lipid Profile: No results for input(s): CHOL, HDL, LDLCALC, TRIG, CHOLHDL, LDLDIRECT in the last 72 hours. Thyroid Function Tests: No results for input(s): TSH, T4TOTAL, FREET4, T3FREE, THYROIDAB in the last 72 hours. Anemia Panel: No results for input(s): VITAMINB12, FOLATE, FERRITIN, TIBC, IRON, RETICCTPCT in the last 72 hours. Sepsis Labs: Recent Labs  Lab 08/12/20 0940 08/12/20 1903 08/12/20 2203  LATICACIDVEN 3.5* 2.0* 1.4    Recent Results (from the past 240 hour(s))  Culture, blood (Routine X 2) w Reflex to ID Panel     Status: None   Collection Time: 08/12/20  9:40 AM   Specimen: BLOOD  Result Value Ref Range Status   Specimen Description BLOOD RIGHT ANTECUBITAL  Final    Special Requests   Final    BOTTLES DRAWN AEROBIC AND ANAEROBIC Blood Culture adequate volume   Culture   Final    NO GROWTH 5 DAYS Performed at [facility], [place] [address] [place], [place] [address]    Report Status 08/17/2020 FINAL  Final  Urine Culture     Status: Abnormal   Collection Time: 08/12/20 10:08 AM   Specimen: Urine, Random  Result Value Ref Range Status   Specimen Description URINE, RANDOM  Final   Special Requests   Final    NONE Performed at [facility], [place] [address] [place], [place] [address]    Culture (A)  Final    10,000 COLONIES/mL MULTIPLE SPECIES PRESENT, SUGGEST RECOLLECTION   Report Status 08/13/2020 FINAL  Final  SARS Coronavirus 2 by RT PCR (hospital order, performed in [facility] hospital lab) Nasopharyngeal     Status: None   Collection Time: 08/12/20 10:08 AM   Specimen: Nasopharyngeal  Result Value Ref Range Status   SARS Coronavirus 2 NEGATIVE NEGATIVE Final    Comment: (NOTE) SARS-CoV-2 target nucleic acids are NOT DETECTED.  The SARS-CoV-2 RNA is generally detectable in upper and lower respiratory specimens during the acute phase of infection. The lowest concentration of SARS-CoV-2 viral copies this assay can detect is 250 copies / mL. A negative result does not preclude SARS-CoV-2 infection and should not be used as the sole basis for treatment or other patient management decisions.  A negative result may occur with improper specimen collection / handling, submission of specimen other than nasopharyngeal swab, presence of viral mutation(s) within the areas targeted by this assay, and inadequate number of viral copies (<250 copies / mL). A negative result must be combined  with clinical observations, patient history, and epidemiological information.  Fact Sheet for Patients:   StrictlyIdeas.no  Fact Sheet for Healthcare Providers: BankingDealers.co.za  This test is not yet  approved or  cleared by the [place] FDA and has been authorized for detection and/or diagnosis of SARS-CoV-2 by FDA under an Emergency Use Authorization (EUA).  This EUA will remain in effect (meaning this test can be used) for the duration of the COVID-19 declaration under Section 564(b)(1) of the Act, 21 U.S.C. section 360bbb-3(b)(1), unless the authorization is terminated or revoked sooner.  Performed at [facility], [place] [address], [place], [place] [address]   MRSA PCR Screening     Status: None   Collection Time: 08/12/20  5:04 PM   Specimen: Nasal Mucosa; Nasopharyngeal  Result Value Ref Range Status   MRSA by PCR NEGATIVE NEGATIVE Final    Comment:        The GeneXpert MRSA Assay (FDA approved for NASAL specimens only), is one component of a comprehensive MRSA colonization surveillance program. It is not intended to diagnose MRSA infection nor to guide or monitor treatment for MRSA infections. Performed at [facility], [place] [address] [place], [place] [address]   Culture, blood (Routine X 2) w Reflex to ID Panel     Status: None   Collection Time: 08/12/20  6:33 PM   Specimen: BLOOD  Result Value Ref Range Status   Specimen Description BLOOD LEFT ANTECUBITAL  Final   Special Requests   Final    BOTTLES DRAWN AEROBIC AND ANAEROBIC Blood Culture adequate volume   Culture   Final    NO GROWTH 5 DAYS Performed at [facility], [place] [address] [place], [place] [address]    Report Status 08/17/2020 FINAL  Final         Radiology Studies: No results found.      Scheduled Meds: . amLODipine  10 mg Oral Daily  . aspirin EC  81 mg Oral Daily  . clopidogrel  75 mg Oral Daily  . diclofenac Sodium  2 g Topical BID  . enalapril  20 mg Oral BID  . enoxaparin (LOVENOX) injection  40 mg Subcutaneous Q24H  . fenofibrate  160 mg Oral Daily  . hydrALAZINE  25 mg Oral Q8H  . influenza vaccine adjuvanted  0.5 mL Intramuscular Tomorrow-1000  .  insulin aspart  0-9 Units Subcutaneous TID WC  . insulin glargine  18 Units Subcutaneous Daily  . metoprolol tartrate  50 mg Oral BID  . mometasone-formoterol  2 puff Inhalation BID  . pantoprazole  40 mg Oral Daily  . risperiDONE  0.5 mg Oral BID  . rosuvastatin  40 mg Oral Daily  . sertraline  100 mg Oral QHS  . sodium chloride flush  3 mL Intravenous Q12H  . umeclidinium bromide  1 puff Inhalation Daily   Continuous Infusions:    Time spent: 29 minutes with over 50% of the time coordinating the patient's care    Harold Hedge, DO [facility]   Call night coverage person covering after 7pm

## 2020-08-17 NOTE — NC FL2 (Signed)
[place] LEVEL OF CARE SCREENING TOOL     IDENTIFICATION  Patient Name: Michelle [name] Birthdate: 1932-07-22 Sex: female Admission Date (Current Location): 08/12/2020  [facility] and [place] Number:  Herbalist and Address:  [facility] [place], [place] [address], [place], [place] [address]      Provider Number: [id number]  Attending Physician Name and Address:  Harold Hedge, MD  Relative Name and Phone Number:       Current Level of Care: Hospital Recommended Level of Care: [place] Prior Approval Number:    Date Approved/Denied:   PASRR Number: [id number]  Discharge Plan: SNF    Current Diagnoses: Patient Active Problem List   Diagnosis Date Noted  . Agitated 08/13/2020  . Acute encephalopathy 08/12/2020  . Lactic acidosis 08/12/2020  . Hyperlipidemia 08/12/2020  . Delirium 06/25/2020  . Actinic keratosis 11/17/2017  . Anemia, iron deficiency 10/23/2015  . Chest pain 12/14/2014  . CKD (chronic kidney disease), stage II 07/25/2014  . Osteoarthritis, knee 05/13/2014  . Anxiety state 04/21/2014  . Chronic diastolic CHF (congestive heart failure) ([place]) 03/13/2014  . SOB (shortness of breath) 03/05/2014  . CAD- RCA PCI '90s, STEMI-RCA DES 02/18/14 03/05/2014  . Back pain, lumbosacral 10/10/2012  . IDDM type 2 03/26/2007  . Essential hypertension 04/21/2006  . COPD (chronic obstructive pulmonary disease) ([place]) 04/21/2006    Orientation RESPIRATION BLADDER Height & Weight        Normal Incontinent,External catheter Weight:   Height:     BEHAVIORAL SYMPTOMS/MOOD NEUROLOGICAL BOWEL NUTRITION STATUS      Incontinent Diet (please see discharge summary)  AMBULATORY STATUS COMMUNICATION OF NEEDS Skin     Verbally Surgical wounds (wound/incision(IAD) perineum RT, LFT)                       Personal Care Assistance Level of Assistance  Bathing,Feeding,Dressing Bathing Assistance: Limited assistance Feeding  assistance: Limited assistance Dressing Assistance: Limited assistance     Functional Limitations Info  Sight,Hearing,Speech Sight Info: Adequate Hearing Info: Adequate Speech Info: Adequate    SPECIAL CARE FACTORS FREQUENCY  PT (By licensed PT),OT (By licensed OT)     PT Frequency: 5x per week OT Frequency: 5x per week            Contractures Contractures Info: Not present    Additional Factors Info  Code Status,Allergies Code Status Info: FULL Code Allergies Info: Codeine Sulfate,Morphine Sulfate           Current Medications (08/17/2020):  This is the current hospital active medication list Current Facility-Administered Medications  Medication Dose Route Frequency Provider Last Rate Last Admin  . acetaminophen (TYLENOL) tablet 650 mg  650 mg Oral Q6H PRN Fuller Plan A, MD   650 mg at 08/16/20 1755   Or  . acetaminophen (TYLENOL) suppository 650 mg  650 mg Rectal Q6H PRN Fuller Plan A, MD      . amLODipine (NORVASC) tablet 10 mg  10 mg Oral Daily Tamala Julian, Rondell A, MD   10 mg at 08/17/20 1025  . aspirin EC tablet 81 mg  81 mg Oral Daily Smith, Rondell A, MD   81 mg at 08/17/20 1024  . clopidogrel (PLAVIX) tablet 75 mg  75 mg Oral Daily Tamala Julian, Rondell A, MD   75 mg at 08/17/20 1025  . diclofenac Sodium (VOLTAREN) 1 % topical gel 2 g  2 g Topical BID Norval Morton, MD  2 g at 08/17/20 1011  . enalapril (VASOTEC) tablet 20 mg  20 mg Oral BID Fuller Plan A, MD   20 mg at 08/17/20 1024  . enoxaparin (LOVENOX) injection 40 mg  40 mg Subcutaneous Q24H Smith, Rondell A, MD   40 mg at 08/17/20 1444  . fenofibrate tablet 160 mg  160 mg Oral Daily Tamala Julian, Rondell A, MD   160 mg at 08/17/20 1024  . hydrALAZINE (APRESOLINE) tablet 25 mg  25 mg Oral Q8H Harold Hedge, MD   25 mg at 08/17/20 1444  . influenza vaccine adjuvanted (FLUAD) injection 0.5 mL  0.5 mL Intramuscular Tomorrow-1000 Donnamae Jude, MD      . insulin aspart (novoLOG) injection 0-9 Units  0-9 Units  Subcutaneous TID WC Norval Morton, MD   3 Units at 08/17/20 1445  . insulin glargine (LANTUS) injection 18 Units  18 Units Subcutaneous Daily Norval Morton, MD   18 Units at 08/17/20 1025  . labetalol (NORMODYNE) injection 10 mg  10 mg Intravenous Q4H PRN Hollace Hayward K, NP   10 mg at 08/17/20 0322  . metoprolol tartrate (LOPRESSOR) tablet 50 mg  50 mg Oral BID Debbe Odea, MD   50 mg at 08/17/20 1023  . mometasone-formoterol (DULERA) 200-5 MCG/ACT inhaler 2 puff  2 puff Inhalation BID Fuller Plan A, MD   2 puff at 08/17/20 0850  . ondansetron (ZOFRAN) tablet 4 mg  4 mg Oral Q6H PRN Fuller Plan A, MD       Or  . ondansetron (ZOFRAN) injection 4 mg  4 mg Intravenous Q6H PRN Smith, Rondell A, MD      . pantoprazole (PROTONIX) EC tablet 40 mg  40 mg Oral Daily Smith, Rondell A, MD   40 mg at 08/17/20 1025  . risperiDONE (RISPERDAL M-TABS) disintegrating tablet 0.5 mg  0.5 mg Oral BID Harold Hedge, MD   0.5 mg at 08/17/20 1025  . rosuvastatin (CRESTOR) tablet 40 mg  40 mg Oral Daily Tamala Julian, Rondell A, MD   40 mg at 08/17/20 1024  . sertraline (ZOLOFT) tablet 100 mg  100 mg Oral QHS Smith, Rondell A, MD   100 mg at 08/16/20 2223  . sodium chloride flush (NS) 0.9 % injection 3 mL  3 mL Intravenous Q12H Smith, Rondell A, MD   3 mL at 08/17/20 1100  . umeclidinium bromide (INCRUSE ELLIPTA) 62.5 MCG/INH 1 puff  1 puff Inhalation Daily Fuller Plan A, MD   1 puff at 08/17/20 8413     Discharge Medications: Please see discharge summary for a list of discharge medications.  Relevant Imaging Results:  Relevant Lab Results:   Additional Information SSN [id number]       Moderna COVID-19 Vaccine 11/22/2019 , 10/25/2019  Vinie Sill, LCSWA

## 2020-08-17 NOTE — Plan of Care (Signed)
  Problem: Safety: Goal: Non-violent Restraint(s) Outcome: Progressing   Problem: Education: Goal: Knowledge of General Education information will improve Description: Including pain rating scale, medication(s)/side effects and non-pharmacologic comfort measures Outcome: Progressing   Problem: Health Behavior/Discharge Planning: Goal: Ability to manage health-related needs will improve Outcome: Progressing   Problem: Clinical Measurements: Goal: Ability to maintain clinical measurements within normal limits will improve Outcome: Progressing Goal: Will remain free from infection Outcome: Progressing

## 2020-08-17 NOTE — Consult Note (Signed)
   [facility] Inpatient Consult   08/17/2020  BEONCA GIBB 1933/02/03 [id number]   [facility] [ACO] Patient: [facility] Medicare  Patient screened for high score for unplanned readmission risk and for hospitalization to check for potential [place] Management service needs.  Review of patient's medical record reveals patient is currently being recommended for a skilled nursing facility for post hospital transition for short term rehab per therapist recommendations.  Primary Care Provider is  [name], MD, [place] this provider is listed to provide the transition of care [TOC] for post hospital follow up. Patient has available the Embedded Chronic Care Management program with the provider's pharmacist.   If patient goes to SNF rehab needs are to be met for transition of care needs.  Plan:  Continue to follow progress and disposition to assess for post hospital care management needs.    For questions contact:   [name], RN BSN [facility] Liaison  [phone] business mobile phone Toll free office [phone]  Fax number: [phone] [name]

## 2020-08-18 DIAGNOSIS — I10 Essential (primary) hypertension: Secondary | ICD-10-CM | POA: Diagnosis not present

## 2020-08-18 LAB — BASIC METABOLIC PANEL
Anion gap: 11 (ref 5–15)
BUN: 20 mg/dL (ref 8–23)
CO2: 21 mmol/L — ABNORMAL LOW (ref 22–32)
Calcium: 9.7 mg/dL (ref 8.9–10.3)
Chloride: 112 mmol/L — ABNORMAL HIGH (ref 98–111)
Creatinine, Ser: 0.76 mg/dL (ref 0.44–1.00)
GFR, Estimated: >60 mL/min (ref >60)
Glucose, Bld: 163 mg/dL — ABNORMAL HIGH (ref 70–99)
Potassium: 3.8 mmol/L (ref 3.5–5.1)
Sodium: 144 mmol/L (ref 135–145)

## 2020-08-18 LAB — CBC
HCT: 41.1 % (ref 36.0–46.0)
Hemoglobin: 14 g/dL (ref 12.0–15.0)
MCH: 30.1 pg (ref 26.0–34.0)
MCHC: 34.1 g/dL (ref 30.0–36.0)
MCV: 88.4 fL (ref 80.0–100.0)
Platelets: 259 10*3/uL (ref 150–400)
RBC: 4.65 MIL/uL (ref 3.87–5.11)
RDW: 12.9 % (ref 11.5–15.5)
WBC: 8.5 10*3/uL (ref 4.0–10.5)
nRBC: 0 % (ref 0.0–0.2)

## 2020-08-18 LAB — GLUCOSE, CAPILLARY
Glucose-Capillary: 159 mg/dL — ABNORMAL HIGH (ref 70–99)
Glucose-Capillary: 145 mg/dL — ABNORMAL HIGH (ref 70–99)
Glucose-Capillary: 211 mg/dL — ABNORMAL HIGH (ref 70–99)
Glucose-Capillary: 226 mg/dL — ABNORMAL HIGH (ref 70–99)

## 2020-08-18 NOTE — TOC Progression Note (Signed)
Transition of Care [facility]) - Progression Note    Patient Details  Name: Michelle [name] MRN: [id number] Date of Birth: Jan 24, 1933  Transition of [facility]) CM/SW [place], [place] Phone Number: 08/18/2020, 2:36 PM  Clinical Narrative:     Reviewed chart for SNF bed offers- patient only has one offer  contacted [facility] to confirm bed offer- waiting on response [facility] - requested to review - waiting on response  CSW will continue to follow and assist with discharge planning.  Thurmond Butts, MSW, LCSW Clinical Social Worker   Expected Discharge Plan: Skilled Nursing Facility Barriers to Discharge: Continued Medical Work up  Expected Discharge Plan and Services Expected Discharge Plan: [place] In-house Referral: Clinical Social Work     Living arrangements for the past 2 months: Single Family Home                                       Social Determinants of Health (SDOH) Interventions    Readmission Risk Interventions No flowsheet data found.

## 2020-08-18 NOTE — Progress Notes (Signed)
Physical Therapy Treatment Patient Details Name: Michelle [name] MRN: [id number] DOB: [birth year] Today's Date: 08/18/2020    History of Present Illness 87 year old female with a past medical history of type 2 diabetes, hypertension, hyperlipidemia, CAD, MI who presented to the ED on 1/26 with complaints of altered mental status    PT Comments    Pt lethargic upon arrival to room, wakes to PT tactile and verbal inputs. Pt tolerated EOB sitting, both supported by PT and in L elbow propped position, x5 minutes. Pt requires total assist for all mobility tasks today, very limited by lethargy. VSS during session, PT continuing to recommend SNF level of care post-acutely.     Follow Up Recommendations  SNF;Supervision/Assistance - 24 hour     Equipment Recommendations   (TBD)    Recommendations for Other Services  (may consider wound care consult if MD agrees JJ:HERD region)     Precautions / Restrictions Precautions Precautions: Fall;Other (comment) Precaution Comments: restraints active bilateral wrist Restrictions Weight Bearing Restrictions: No    Mobility  Bed Mobility Overal bed mobility: Needs Assistance Bed Mobility: Rolling;Sidelying to Sit;Sit to Supine Rolling: Total assist Sidelying to sit: Total assist   Sit to supine: Total assist   General bed mobility comments: total assist for all aspects, very increased time and effort to perform.  Transfers                 General transfer comment: unable to assess today, lethargic  Ambulation/Gait                 Stairs             Wheelchair Mobility    Modified Rankin (Stroke Patients Only)       Balance Overall balance assessment: Needs assistance (defer assessment at this time) Sitting-balance support: Bilateral upper extremity supported;Feet supported Sitting balance-Leahy Scale: Poor Sitting balance - Comments: requiring at least min posterior assist to maintain upright sitting, able  to prop on LUE without PT support briefly. EOB sitting x5 minutes, VSS       Standing balance comment: NT                            Cognition Arousal/Alertness: Lethargic Behavior During Therapy: Flat affect Overall Cognitive Status: Impaired/Different from baseline Area of Impairment: Orientation;Attention;Following commands                 Orientation Level: Disoriented to;Place;Time;Situation Current Attention Level: Focused   Following Commands: Follows one step commands inconsistently   Awareness: Intellectual Problem Solving: Slow processing;Decreased initiation;Difficulty sequencing;Requires verbal cues;Requires tactile cues General Comments: Pt correctly selects name from list of 3 names with head nodding, disoriented to location but does look around the room when PT asks "are you in the hospital?". Pt follows little to no commands, limited by lethargy      Exercises General Exercises - Lower Extremity Long Arc Quad: PROM;Both;5 reps;Seated (more like 3 reps bilaterally, limited tolerance)    General Comments General comments (skin integrity, edema, etc.): SpO64min 93% on RA, RRmax 30 breaths/min, HR stable. Bruising noted between pt thighs, R>L      Pertinent Vitals/Pain Pain Assessment: Faces Faces Pain Scale: Hurts a little bit Pain Location: generalized during mobility Pain Descriptors / Indicators: Discomfort;Guarding Pain Intervention(s): Limited activity within patient's tolerance;Monitored during session;Repositioned    Home Living  Prior Function            PT Goals (current goals can now be found in the care plan section) Acute Rehab PT Goals Patient Stated Goal: to take his mom home once she can do so safely PT Goal Formulation: With family Time For Goal Achievement: 08/30/20 Potential to Achieve Goals: Fair Progress towards PT goals: Progressing toward goals    Frequency    Min 2X/week       PT Plan Current plan remains appropriate    Co-evaluation              AM-PAC PT "6 Clicks" Mobility   Outcome Measure  Help needed turning from your back to your side while in a flat bed without using bedrails?: Total Help needed moving from lying on your back to sitting on the side of a flat bed without using bedrails?: Total Help needed moving to and from a bed to a chair (including a wheelchair)?: Total Help needed standing up from a chair using your arms (e.g., wheelchair or bedside chair)?: Total Help needed to walk in hospital room?: Total Help needed climbing 3-5 steps with a railing? : Total 6 Click Score: 6    End of Session   Activity Tolerance: Patient limited by fatigue;Patient limited by lethargy Patient left: in bed;with call bell/phone within reach;with bed alarm set;with family/visitor present;with restraints reapplied Nurse Communication: Mobility status PT Visit Diagnosis: Difficulty in walking, not elsewhere classified (R26.2);Muscle weakness (generalized) (M62.81);Other symptoms and signs involving the nervous system (R29.898)     Time: 7124-5809 PT Time Calculation (min) (ACUTE ONLY): 18 min  Charges:  $Therapeutic Activity: 8-22 mins                     Michelle [name], PT Acute Rehabilitation Services Pager [phone]  Office [phone]'    [place] E Stroup 08/18/2020, 9:43 AM

## 2020-08-18 NOTE — Progress Notes (Signed)
PROGRESS NOTE    Michelle [name]    Code Status: Full Code  ZDG:[id number] DOB: [birth year] DOA: 08/12/2020 LOS: 5 days  PCP: [name], MD CC:  Chief Complaint  Patient presents with  . Altered Mental Status       Hospital Summary   This is an 87 year old female with a past medical history of type 2 diabetes, hypertension, hyperlipidemia, CAD, MI who presented to the ED on 1/26 with complaints of altered mental status.  Per Dr. Reggy Eye note 08/13/2020: Per HPI:  History is obtained from the son over the phone. He heard his mother snoring very loudly today and went in the room to check on her. When he went into the room to check on her found her with her eyes open not responding and drooling of the side of her mouth. Her teeth were very clenched and he immediately called 911. No lossof bowel or bladder noted by the patient's son. Patient reports that she is not aware of why she is here today and has no recollection of the events leading up to her hospitalization. Currently she complains of right shoulder and back pain. Son package pills for her dailyand states that her primary care provider said that she could take 3 Tylenol 500 mg 3 times a day with tramadol if needed for pain. The only recent changes that they increased her spironolactone 12.5 mg daily to 25 mg.   Of note: She was last admitted in 12/21 with delirium and work up was unrevealing. If was felt that uncontrolled BP was contributing to her delirium and that she had underlying dementia.   1/27: EEG suggestive of mild diffuse encephalopathy and nonspecific etiology with no seizures or epileptiform discharges.  She was started on Zyprexa as needed for agitation during the day and Risperdal twice daily to reduce overall behavioral issues and restlessness.     A & P   Principal Problem:   Acute encephalopathy Active Problems:   IDDM type 2   Essential hypertension   Back pain, lumbosacral   Lactic  acidosis   Hyperlipidemia   Agitated   1. Acute metabolic encephalopathy of unclear etiology, possibly delirium in the setting of dementia a. Previously reported as possible exacerbation of underlying dementia and possibly hypertension and worsened by medications (Ativan, Haldol, melatonin) b. More somnolent today but arouses easily, not at baseline c. EEG with mild diffuse encephalopathy d. CT head with mild chronic white matter ischemic changes but no acute changes e. UA unremarkable f. Hold Zyprexa g. Avoid further Ativan and Haldol h. Discontinued melatonin i. Discontinue Risperdal  j. SLP eval  k. PT eval -SNF l. May have underlying OSA  2. Type 2 diabetes, stable a. Continue Lantus and sliding scale  3. Hypertension, improved a. Currently on Lopressor 50 mg twice daily, amlodipine 10 mg enalapril 20 mg twice daily b. Added hydralazine 25 mg 3 times daily with improved Bps but still hypertensive in AM c. Recommend outpatient sleep study if this has not been done d. Given her hypokalemia and resistant hypertension, hyperaldosteronism is on the differential i. Follow up on Aldosterone and Renin activity and ratio. Of note she is already on an ACE inhibitor which can effect this lab value e. As needed labetalol  4. Lactic acidosis, resolved  5. Hypokalemia, resolved  6. Hyperlipidemia a. Continue statin  7. CAD a. Continue aspirin, Plavix, statin  DVT prophylaxis: enoxaparin (LOVENOX) injection 40 mg Start: 08/12/20 1515   Family Communication: Son at  bedside yesterday  Disposition Plan: Pending improved mental status and SNF availability.  Hopefully medically stable in 48 to 72 hours if she returns to her baseline mental status  Status is: Inpatient  Remains inpatient appropriate because:Altered mental status and Inpatient level of care appropriate due to severity of illness   Dispo: The patient is from: Home              Anticipated d/c is to: SNF               Anticipated d/c date is: 3 days              Patient currently is not medically stable to d/c.   Difficult to place patient No           Pressure injury documentation    None  Consultants  None  Procedures  EEG  Antibiotics   Anti-infectives (From admission, onward)   None        Subjective   Patient sleeping. She will wake up, smile and answer questions then fall back to sleep today. She does not have any complaints at this time but is a poor historian. No overnight events.   Objective   Vitals:   08/18/20 0341 08/18/20 0706 08/18/20 1103 08/18/20 1211  BP: (!) 194/83 (!) 186/80 (!) 192/87 (!) 158/72  Pulse: 88 86 88 79  Resp: 20 (!) 24 18 (!) 29  Temp: 98.1 F (36.7 C) 99.4 F (37.4 C) 97.6 F (36.4 C)   TempSrc: Oral Axillary Oral   SpO2: 99% 97% 99% 93%    Intake/Output Summary (Last 24 hours) at 08/18/2020 1700 Last data filed at 08/18/2020 1440 Gross per 24 hour  Intake 492 ml  Output --  Net 492 ml   There were no vitals filed for this visit.  Examination:  Physical Exam Vitals and nursing note reviewed.  HENT:     Head: Normocephalic and atraumatic.  Eyes:     Conjunctiva/sclera: Conjunctivae normal.  Cardiovascular:     Rate and Rhythm: Normal rate and regular rhythm.  Pulmonary:     Effort: Pulmonary effort is normal.     Breath sounds: Normal breath sounds.  Abdominal:     General: Abdomen is flat.     Palpations: Abdomen is soft.  Musculoskeletal:        General: No swelling or tenderness.  Skin:    Coloration: Skin is not jaundiced or pale.  Neurological:     Mental Status: She is alert. She is disoriented.  Psychiatric:        Mood and Affect: Mood normal.        Behavior: Behavior normal.     Data Reviewed: I have personally reviewed following labs and imaging studies  CBC: Recent Labs  Lab 08/12/20 0940 08/13/20 0439 08/15/20 0912 08/17/20 0301 08/18/20 0706  WBC 7.2 12.3* 7.8 9.3 8.5  NEUTROABS 4.5  --    --   --   --   HGB 14.5 13.6 13.9 14.0 14.0  HCT 43.3 40.9 40.2 41.3 41.1  MCV 89.8 88.1 88.2 89.6 88.4  PLT 233 267 257 262 [id number]   Basic Metabolic Panel: Recent Labs  Lab 08/13/20 0439 08/14/20 0706 08/15/20 0912 08/15/20 1754 08/17/20 0301 08/18/20 0706  NA 138 142 144  --  143 144  K 3.1* 2.8* 3.7  --  3.1* 3.8  CL 102 105 112*  --  109 112*  CO2 23 24 22   --  21*  21*  GLUCOSE 168* 157* 166*  --  128* 163*  BUN 11 8 11   --  21 20  CREATININE 0.74 0.78 0.72  --  0.78 0.76  CALCIUM 9.6 9.5 9.6  --  9.5 9.7  MG  --   --   --  1.8  --   --    GFR: CrCl cannot be calculated (Unknown ideal weight.). Liver Function Tests: Recent Labs  Lab 08/12/20 0940  AST 18  ALT 15  ALKPHOS 37*  BILITOT 0.6  PROT 7.2  ALBUMIN 3.8   No results for input(s): LIPASE, AMYLASE in the last 168 hours. Recent Labs  Lab 08/12/20 1427  AMMONIA 23   Coagulation Profile: No results for input(s): INR, PROTIME in the last 168 hours. Cardiac Enzymes: No results for input(s): CKTOTAL, CKMB, CKMBINDEX, TROPONINI in the last 168 hours. BNP (last 3 results) No results for input(s): PROBNP in the last 8760 hours. HbA1C: No results for input(s): HGBA1C in the last 72 hours. CBG: Recent Labs  Lab 08/17/20 1619 08/17/20 2128 08/18/20 0727 08/18/20 1103 08/18/20 1530  GLUCAP 176* 194* 159* 145* 211*   Lipid Profile: No results for input(s): CHOL, HDL, LDLCALC, TRIG, CHOLHDL, LDLDIRECT in the last 72 hours. Thyroid Function Tests: No results for input(s): TSH, T4TOTAL, FREET4, T3FREE, THYROIDAB in the last 72 hours. Anemia Panel: No results for input(s): VITAMINB12, FOLATE, FERRITIN, TIBC, IRON, RETICCTPCT in the last 72 hours. Sepsis Labs: Recent Labs  Lab 08/12/20 0940 08/12/20 1903 08/12/20 2203  LATICACIDVEN 3.5* 2.0* 1.4    Recent Results (from the past 240 hour(s))  Culture, blood (Routine X 2) w Reflex to ID Panel     Status: None   Collection Time: 08/12/20  9:40 AM    Specimen: BLOOD  Result Value Ref Range Status   Specimen Description BLOOD RIGHT ANTECUBITAL  Final   Special Requests   Final    BOTTLES DRAWN AEROBIC AND ANAEROBIC Blood Culture adequate volume   Culture   Final    NO GROWTH 5 DAYS Performed at [facility], [place] [address] [place], [place] [address]    Report Status 08/17/2020 FINAL  Final  Urine Culture     Status: Abnormal   Collection Time: 08/12/20 10:08 AM   Specimen: Urine, Random  Result Value Ref Range Status   Specimen Description URINE, RANDOM  Final   Special Requests   Final    NONE Performed at [facility], [place] [address] [place], [place] [address]    Culture (A)  Final    10,000 COLONIES/mL MULTIPLE SPECIES PRESENT, SUGGEST RECOLLECTION   Report Status 08/13/2020 FINAL  Final  SARS Coronavirus 2 by RT PCR (hospital order, performed in [facility] hospital lab) Nasopharyngeal     Status: None   Collection Time: 08/12/20 10:08 AM   Specimen: Nasopharyngeal  Result Value Ref Range Status   SARS Coronavirus 2 NEGATIVE NEGATIVE Final    Comment: (NOTE) SARS-CoV-2 target nucleic acids are NOT DETECTED.  The SARS-CoV-2 RNA is generally detectable in upper and lower respiratory specimens during the acute phase of infection. The lowest concentration of SARS-CoV-2 viral copies this assay can detect is 250 copies / mL. A negative result does not preclude SARS-CoV-2 infection and should not be used as the sole basis for treatment or other patient management decisions.  A negative result may occur with improper specimen collection / handling, submission of specimen other than nasopharyngeal swab, presence of viral mutation(s) within  the areas targeted by this assay, and inadequate number of viral copies (<250 copies / mL). A negative result must be combined with clinical observations, patient history, and epidemiological information.  Fact Sheet for Patients:    StrictlyIdeas.no  Fact Sheet for Healthcare Providers: BankingDealers.co.za  This test is not yet approved or  cleared by the [place] FDA and has been authorized for detection and/or diagnosis of SARS-CoV-2 by FDA under an Emergency Use Authorization (EUA).  This EUA will remain in effect (meaning this test can be used) for the duration of the COVID-19 declaration under Section 564(b)(1) of the Act, 21 U.S.C. section 360bbb-3(b)(1), unless the authorization is terminated or revoked sooner.  Performed at [facility], [place] [address] [place], [place] [address]   MRSA PCR Screening     Status: None   Collection Time: 08/12/20  5:04 PM   Specimen: Nasal Mucosa; Nasopharyngeal  Result Value Ref Range Status   MRSA by PCR NEGATIVE NEGATIVE Final    Comment:        The GeneXpert MRSA Assay (FDA approved for NASAL specimens only), is one component of a comprehensive MRSA colonization surveillance program. It is not intended to diagnose MRSA infection nor to guide or monitor treatment for MRSA infections. Performed at [facility], [place] [address] [place], [place] [address]   Culture, blood (Routine X 2) w Reflex to ID Panel     Status: None   Collection Time: 08/12/20  6:33 PM   Specimen: BLOOD  Result Value Ref Range Status   Specimen Description BLOOD LEFT ANTECUBITAL  Final   Special Requests   Final    BOTTLES DRAWN AEROBIC AND ANAEROBIC Blood Culture adequate volume   Culture   Final    NO GROWTH 5 DAYS Performed at [facility], [place] [address], [place],  [address]    Report Status 08/17/2020 FINAL  Final         Radiology Studies: No results found.      Scheduled Meds: . amLODipine  10 mg Oral Daily  . aspirin EC  81 mg Oral Daily  . clopidogrel  75 mg Oral Daily  . diclofenac Sodium  2 g Topical BID  . enalapril  20 mg Oral BID  . enoxaparin (LOVENOX) injection  40 mg  Subcutaneous Q24H  . fenofibrate  160 mg Oral Daily  . Gerhardt's butt cream   Topical TID  . hydrALAZINE  25 mg Oral Q8H  . insulin aspart  0-9 Units Subcutaneous TID WC  . insulin glargine  18 Units Subcutaneous Daily  . metoprolol tartrate  50 mg Oral BID  . mometasone-formoterol  2 puff Inhalation BID  . pantoprazole  40 mg Oral Daily  . rosuvastatin  40 mg Oral Daily  . sertraline  100 mg Oral QHS  . sodium chloride flush  3 mL Intravenous Q12H  . umeclidinium bromide  1 puff Inhalation Daily   Continuous Infusions:    Time spent: 25 minutes with over 50% of the time coordinating the patient's care    Harold Hedge, DO [facility]   Call night coverage person covering after 7pm

## 2020-08-18 NOTE — TOC Initial Note (Signed)
Transition of Care [facility]) - Initial/Assessment Note    Patient Details  Name: Michelle [name] MRN: [id number] Date of Birth: Apr 04, 1933  Transition [facility]) CM/SW Contact:    Vinie Sill, [place] Phone Number: 08/18/2020, 2:25 PM  Clinical Narrative:                  CSW met with patient's son,Randy. CSW introduced self and explained role. CSW discussed PT recommendation of short term rehab at [facility]. He confirmed patient lives in the home with home. He shared how she has declined and is agreeable to SNF placement. CSW explained the SNF process. No preferred SNF at this time. Patient has received covid vaccines.   CSW will provide bed offers once available.  CSW will continue to follow and assist with discharge planning.  Thurmond Butts, MSW, LCSW Clinical Social Worker   Expected Discharge Plan: Skilled Nursing Facility Barriers to Discharge: Continued Medical Work up   Patient Goals and CMS Choice        Expected Discharge Plan and Services Expected Discharge Plan: [place] In-house Referral: Clinical Social Work     Living arrangements for the past 2 months: Single Family Home                                      Prior Living Arrangements/Services Living arrangements for the past 2 months: Single Family Home Lives with:: Self,Adult Children                   Activities of Daily Living      Permission Sought/Granted                  Emotional Assessment              Admission diagnosis:  Acute encephalopathy [G93.40] Altered mental status, unspecified altered mental status type [R41.82] Agitated [R45.1] Patient Active Problem List   Diagnosis Date Noted  . Agitated 08/13/2020  . Acute encephalopathy 08/12/2020  . Lactic acidosis 08/12/2020  . Hyperlipidemia 08/12/2020  . Delirium 06/25/2020  . Actinic keratosis 11/17/2017  . Anemia, iron deficiency 10/23/2015  . Chest pain 12/14/2014  . CKD (chronic  kidney disease), stage II 07/25/2014  . Osteoarthritis, knee 05/13/2014  . Anxiety state 04/21/2014  . Chronic diastolic CHF (congestive heart failure) ([place]) 03/13/2014  . SOB (shortness of breath) 03/05/2014  . CAD- RCA PCI '90s, STEMI-RCA DES 02/18/14 03/05/2014  . Back pain, lumbosacral 10/10/2012  . IDDM type 2 03/26/2007  . Essential hypertension 04/21/2006  . COPD (chronic obstructive pulmonary disease) ([place]) 04/21/2006   PCP:  [name], MD Pharmacy:   [facility] [place], [place] [address] Phone: [phone] Fax: [facility] [address] [place] [address] Phone: [phone] Fax: [phone]     Social Determinants of Health (SDOH) Interventions    Readmission Risk Interventions No flowsheet data found.

## 2020-08-18 NOTE — Progress Notes (Signed)
Manual BP 170/64 HR: 80 No other PRN available. On call notified and aware. No new orders.

## 2020-08-18 NOTE — Progress Notes (Signed)
  Speech Language Pathology Treatment: Dysphagia  Patient Details Name: Michelle [name] MRN: [id number] DOB: Feb 27, 1933 Today's Date: 08/18/2020 Time: 1962-2297 SLP Time Calculation (min) (ACUTE ONLY): 15 min  Assessment / Plan / Recommendation Clinical Impression  Pt was accepting of pudding and water via cup and straw. Her confusion continues and this therapist unfamiliar with pt to compare from prior session/eval. After first sips water (cup) there was an immediate cough and therapist altered subsequent sips to challenge integrity of swallow which was unremarkable for s/s aspiration. Therapist facilitated cup and straw presentations, single versus 2 consecutive sips (did not/could not take sequential > 2). Boluses of pudding were transited timely. No family at bedside. Did not attempt higher textures with cognitive impairment and documentation of son reporting baseline problems masticating and not having dentures. Will continue to follow for improvements in mentation and ability to upgrade or remain on puree during hospitalization and recommend home health ST.   HPI HPI: This is an 87 year old female with a past medical history of type 2 diabetes, hypertension, hyperlipidemia, CAD, MI who presented to the ED on 1/26 with complaints of altered mental status. Head CT and CXR 1/26 without acute findings      SLP Plan  Continue with current plan of care       Recommendations  Diet recommendations: Dysphagia 1 (puree);Thin liquid Liquids provided via: Cup;Straw Medication Administration: Crushed with puree Supervision: Staff to assist with self feeding;Full supervision/cueing for compensatory strategies Compensations: Minimize environmental distractions;Slow rate;Small sips/bites Postural Changes and/or Swallow Maneuvers: Seated upright 90 degrees                Oral Care Recommendations: Oral care BID Follow up Recommendations:  (TBA) SLP Visit Diagnosis: Dysphagia, oral phase  (R13.11) Plan: Continue with current plan of care                       Houston Siren 08/18/2020, 11:32 AM Orbie Pyo Colvin Caroli.Ed Risk analyst [phone] Office [phone]

## 2020-08-19 ENCOUNTER — Other Ambulatory Visit: Payer: Self-pay | Admitting: Family Medicine

## 2020-08-19 DIAGNOSIS — R451 Restlessness and agitation: Secondary | ICD-10-CM

## 2020-08-19 DIAGNOSIS — E1349 Other specified diabetes mellitus with other diabetic neurological complication: Secondary | ICD-10-CM | POA: Diagnosis not present

## 2020-08-19 DIAGNOSIS — R410 Disorientation, unspecified: Secondary | ICD-10-CM | POA: Diagnosis not present

## 2020-08-19 DIAGNOSIS — I2510 Atherosclerotic heart disease of native coronary artery without angina pectoris: Secondary | ICD-10-CM

## 2020-08-19 DIAGNOSIS — I10 Essential (primary) hypertension: Secondary | ICD-10-CM | POA: Diagnosis not present

## 2020-08-19 DIAGNOSIS — G9341 Metabolic encephalopathy: Secondary | ICD-10-CM | POA: Diagnosis present

## 2020-08-19 DIAGNOSIS — E876 Hypokalemia: Secondary | ICD-10-CM | POA: Diagnosis present

## 2020-08-19 DIAGNOSIS — IMO0002 Reserved for concepts with insufficient information to code with codable children: Secondary | ICD-10-CM | POA: Diagnosis present

## 2020-08-19 DIAGNOSIS — E1365 Other specified diabetes mellitus with hyperglycemia: Secondary | ICD-10-CM

## 2020-08-19 LAB — CBC
HCT: 46.3 % — ABNORMAL HIGH (ref 36.0–46.0)
Hemoglobin: 14.5 g/dL (ref 12.0–15.0)
MCH: 29.1 pg (ref 26.0–34.0)
MCHC: 31.3 g/dL (ref 30.0–36.0)
MCV: 93 fL (ref 80.0–100.0)
Platelets: 228 10*3/uL (ref 150–400)
RBC: 4.98 MIL/uL (ref 3.87–5.11)
RDW: 13 % (ref 11.5–15.5)
WBC: 7.8 10*3/uL (ref 4.0–10.5)
nRBC: 0 % (ref 0.0–0.2)

## 2020-08-19 LAB — GLUCOSE, CAPILLARY
Glucose-Capillary: 164 mg/dL — ABNORMAL HIGH (ref 70–99)
Glucose-Capillary: 168 mg/dL — ABNORMAL HIGH (ref 70–99)
Glucose-Capillary: 180 mg/dL — ABNORMAL HIGH (ref 70–99)
Glucose-Capillary: 120 mg/dL — ABNORMAL HIGH (ref 70–99)

## 2020-08-19 LAB — BASIC METABOLIC PANEL
Anion gap: 11 (ref 5–15)
BUN: 19 mg/dL (ref 8–23)
CO2: 21 mmol/L — ABNORMAL LOW (ref 22–32)
Calcium: 10 mg/dL (ref 8.9–10.3)
Chloride: 110 mmol/L (ref 98–111)
Creatinine, Ser: 0.72 mg/dL (ref 0.44–1.00)
GFR, Estimated: >60 mL/min (ref >60)
Glucose, Bld: 167 mg/dL — ABNORMAL HIGH (ref 70–99)
Potassium: 3.8 mmol/L (ref 3.5–5.1)
Sodium: 142 mmol/L (ref 135–145)

## 2020-08-19 LAB — VITAMIN B12: Vitamin B-12: 189 pg/mL (ref 180–914)

## 2020-08-19 MED ORDER — HYDRALAZINE HCL 50 MG PO TABS
50.0000 mg | ORAL_TABLET | Freq: Three times a day (TID) | ORAL | Status: DC
  Administered 2020-08-19 – 2020-09-01 (×35): 50 mg via ORAL
  Filled 2020-08-19 (×37): qty 1

## 2020-08-19 MED ORDER — INSULIN ASPART 100 UNIT/ML ~~LOC~~ SOLN
4.0000 [IU] | Freq: Three times a day (TID) | SUBCUTANEOUS | Status: DC
  Administered 2020-08-19 – 2020-09-01 (×32): 4 [IU] via SUBCUTANEOUS

## 2020-08-19 NOTE — TOC Progression Note (Addendum)
Transition of Care [facility]) - Progression Note    Patient Details  Name: Michelle [name] MRN: [id number] Date of Birth: 1932/07/27  Transition of Care [facility]) CM/SW [place], [place] Phone Number: 08/19/2020, 3:33 PM  Clinical Narrative:     Called patient's son,Randy- provide bed offers (only 2 at this time)- encouraged family to review for possible placement once medically stable.   Thurmond Butts, MSW, LCSW Clinical Social Worker   Expected Discharge Plan: Skilled Nursing Facility Barriers to Discharge: Continued Medical Work up  Expected Discharge Plan and Services Expected Discharge Plan: [place] In-house Referral: Clinical Social Work     Living arrangements for the past 2 months: Single Family Home                                       Social Determinants of Health (SDOH) Interventions    Readmission Risk Interventions No flowsheet data found.

## 2020-08-19 NOTE — Progress Notes (Signed)
Progress Note    Michelle [name]  [id number] DOB: Feb 07, 1933  DOA: 08/12/2020 PCP: [name], MD    Brief Narrative:   Chief complaint: F/U altered mental status.  Medical records reviewed and are as summarized below:  Michelle [name] is an 87 y.o. female with a past medical history of type 2 diabetes, hypertension, hyperlipidemia, CAD, MI who presented to the ED via EMS on 08/12/20 after family became concerned that she was more confused, lethargic, and disoriented than usual baseline.  EMS noted combativeness and administered Versed. Of note: She was last admitted in 07/07/20 with delirium of uncertain etiology despite work-up. If was felt that uncontrolled BP was contributing to her delirium and that she had underlying dementia.EEG done 08/13/20 suggestive of mild diffuse encephalopathy and nonspecific etiology with no seizures or epileptiform discharges.  She was started on Zyprexa as needed for agitation during the day and Risperdal twice daily to reduce overall behavioral issues and restlessness.  At this time, she remains disoriented, restless, and needing restraint for safety.  Assessment/Plan:   Principle problem: Acute metabolic encephalopathy of unclear etiology, possibly hypertensive versus delirium in the setting of dementia, POA Previously reported as possible exacerbation of underlying dementia and possibly hypertension and worsened by medications (Ativan, Haldol, melatonin).  EEG consistent with diffuse encephalopathy.  CT unrevealing, only showing mild chronic white matter ischemic changes.  No evidence of UTI/infection. Sedating medications have been held, and the patient is not somnolent today, but is confused and restless. She is a full-code. Per son, she was fairly functional prior to admit. She ambulated with a walker. Lives with son at baseline, who does the shopping. She did some cooking, but occasionally burned the food because she forgot she was cooking  it.  Will check B12, RBC folate and RPR to rule out other possible metabolic etiologies.   Active Problems: Type 2 diabetes, with long-term use of insulin, with vascular & neurological complications/hyperglycemia, POA Currently being managed with  Lantus and sliding scale. CBGs 145-226.  Will add 4 units of meal coverage for better glycemic control.  Hypertension, uncontrolled/chronic diastolic CHF, POA Currently on Lopressor 50 mg twice daily, amlodipine 10 mg enalapril 20 mg twice daily and hydralazine 25 mg Q 8 hours.  Increase Hydralazine due to uncontrolled BP. Has been requiring PRN doses of hydralazine. Some concern that underlying OSA may be driving her HTN with recommendations to pursue outpatient sleep study. Hyperaldosteronism being ruled out. F/U aldosterone/renin activity and ratio.  Lactic acidosis, resolved  Hypokalemia, resolved  Hyperlipidemia, POA Continue Crestor and fenofibrate.  CAD, POA Continue aspirin, Plavix, beta blocker.  Progression Remains confused and in wrist restraints.  Continue to control BP and have asked nursing to waken her and feed her if sleeping. At risk for malnutrition given poor intake. Has 1 SNF offer, and should qualify based on her prior level of function per son's report.   Family Communication/Anticipated D/C date and plan/Code Status   DVT prophylaxis: enoxaparin (LOVENOX) injection 40 mg Start: 08/12/20 1515   Current Level of Care:: Telemetry Medical Code Status: Full Code.  Family Communication: Son updated by telephone and aware of plan for SNF placement. Disposition Plan: Status is: Inpatient  Remains inpatient appropriate because:Altered mental status, remains in restraints for safety.   Dispo:  Patient From: Home  Planned Disposition: [place]  Expected discharge date: 08/22/2020  Medically stable for discharge: No     Medical Consultants:    Neurology  Anti-Infectives:     None  Subjective:   Pleasantly confused.  Says she doesn't feel well, but cannot articulate why.  Poor appetite.  Has wrist restraints on.  Objective:    Vitals:   08/18/20 2354 08/19/20 0520 08/19/20 0600 08/19/20 0736  BP: (!) 170/64 (!) 164/64  (!) 182/74  Pulse:   77 84  Resp:  (!) 28 20 20   Temp:    98.8 F (37.1 C)  TempSrc:    Oral  SpO2:   97% 97%    Intake/Output Summary (Last 24 hours) at 08/19/2020 [id number] Last data filed at 08/18/2020 1758 Gross per 24 hour  Intake 612 ml  Output 250 ml  Net 362 ml   There were no vitals filed for this visit.  Exam: General: Restless, ill-appearing with glassy eyes. Cardiovascular: Heart sounds show a regular rate, and rhythm. No gallops or rubs. II/VI systolic murmur.  No JVD. Lungs: Diminished breath sounds. Abdomen: Soft, nontender, nondistended with normal active bowel sounds. No masses. No hepatosplenomegaly. Neurological: Alert and oriented to place but not date or year. Knows Barbette Or is president. Moves all extremities 4 with equal but diminished strength. Cranial nerves II through XII grossly intact. Skin: Warm and dry. Scattered ecchymosis with large bruise right forearm. Extremities: No clubbing or cyanosis. 1+ edema. Pedal pulses 2+. Psychiatric: Mood and affect are flat. Insight and judgment are impaired.     Data Reviewed:   I have personally reviewed following labs and imaging studies:  Labs: Labs show the following:   Basic Metabolic Panel: Recent Labs  Lab 08/14/20 0706 08/15/20 0912 08/15/20 1754 08/17/20 0301 08/18/20 0706 08/19/20 0141  NA 142 144  --  143 144 142  K 2.8* 3.7  --  3.1* 3.8 3.8  CL 105 112*  --  109 112* 110  CO2 24 22  --  21* 21* 21*  GLUCOSE 157* 166*  --  128* 163* 167*  BUN 8 11  --  21 20 19   CREATININE 0.78 0.72  --  0.78 0.76 0.72  CALCIUM 9.5 9.6  --  9.5 9.7 10.0  MG  --   --  1.8  --   --   --    GFR CrCl cannot be calculated (Unknown ideal weight.). Liver  Function Tests: Recent Labs  Lab 08/12/20 0940  AST 18  ALT 15  ALKPHOS 37*  BILITOT 0.6  PROT 7.2  ALBUMIN 3.8    Recent Labs  Lab 08/12/20 1427  AMMONIA 23   CBC: Recent Labs  Lab 08/12/20 0940 08/13/20 0439 08/15/20 0912 08/17/20 0301 08/18/20 0706 08/19/20 0141  WBC 7.2 12.3* 7.8 9.3 8.5 7.8  NEUTROABS 4.5  --   --   --   --   --   HGB 14.5 13.6 13.9 14.0 14.0 14.5  HCT 43.3 40.9 40.2 41.3 41.1 46.3*  MCV 89.8 88.1 88.2 89.6 88.4 93.0  PLT 233 267 257 262 259 228   CBG: Recent Labs  Lab 08/18/20 0727 08/18/20 1103 08/18/20 1530 08/18/20 2134 08/19/20 0735  GLUCAP 159* 145* 211* 226* 164*   Sepsis Labs: Recent Labs  Lab 08/12/20 0940 08/12/20 1903 08/12/20 2203 08/13/20 0439 08/15/20 0912 08/17/20 0301 08/18/20 0706 08/19/20 0141  WBC 7.2  --   --    < > 7.8 9.3 8.5 7.8  LATICACIDVEN 3.5* 2.0* 1.4  --   --   --   --   --    < > = values in  this interval not displayed.    Microbiology Recent Results (from the past 240 hour(s))  Culture, blood (Routine X 2) w Reflex to ID Panel     Status: None   Collection Time: 08/12/20  9:40 AM   Specimen: BLOOD  Result Value Ref Range Status   Specimen Description BLOOD RIGHT ANTECUBITAL  Final   Special Requests   Final    BOTTLES DRAWN AEROBIC AND ANAEROBIC Blood Culture adequate volume   Culture   Final    NO GROWTH 5 DAYS Performed at [facility], [address], [place], [place] [address]    Report Status 08/17/2020 FINAL  Final  Urine Culture     Status: Abnormal   Collection Time: 08/12/20 10:08 AM   Specimen: Urine, Random  Result Value Ref Range Status   Specimen Description URINE, RANDOM  Final   Special Requests   Final    NONE Performed at [facility], [place] [address], [place], [place] [address]    Culture (A)  Final    10,000 COLONIES/mL MULTIPLE SPECIES PRESENT, SUGGEST RECOLLECTION   Report Status 08/13/2020 FINAL  Final  SARS Coronavirus 2 by RT PCR (hospital order,  performed in [facility] hospital lab) Nasopharyngeal     Status: None   Collection Time: 08/12/20 10:08 AM   Specimen: Nasopharyngeal  Result Value Ref Range Status   SARS Coronavirus 2 NEGATIVE NEGATIVE Final    Comment: (NOTE) SARS-CoV-2 target nucleic acids are NOT DETECTED.  The SARS-CoV-2 RNA is generally detectable in upper and lower respiratory specimens during the acute phase of infection. The lowest concentration of SARS-CoV-2 viral copies this assay can detect is 250 copies / mL. A negative result does not preclude SARS-CoV-2 infection and should not be used as the sole basis for treatment or other patient management decisions.  A negative result may occur with improper specimen collection / handling, submission of specimen other than nasopharyngeal swab, presence of viral mutation(s) within the areas targeted by this assay, and inadequate number of viral copies (<250 copies / mL). A negative result must be combined with clinical observations, patient history, and epidemiological information.  Fact Sheet for Patients:   StrictlyIdeas.no  Fact Sheet for Healthcare Providers: BankingDealers.co.za  This test is not yet approved or  cleared by the [place] FDA and has been authorized for detection and/or diagnosis of SARS-CoV-2 by FDA under an Emergency Use Authorization (EUA).  This EUA will remain in effect (meaning this test can be used) for the duration of the COVID-19 declaration under Section 564(b)(1) of the Act, 21 U.S.C. section 360bbb-3(b)(1), unless the authorization is terminated or revoked sooner.  Performed at [facility], [place] [address], [place], [place] [address]   MRSA PCR Screening     Status: None   Collection Time: 08/12/20  5:04 PM   Specimen: Nasal Mucosa; Nasopharyngeal  Result Value Ref Range Status   MRSA by PCR NEGATIVE NEGATIVE Final    Comment:        The GeneXpert MRSA Assay  (FDA approved for NASAL specimens only), is one component of a comprehensive MRSA colonization surveillance program. It is not intended to diagnose MRSA infection nor to guide or monitor treatment for MRSA infections. Performed at [facility], [place] [address], [place],  [address]   Culture, blood (Routine X 2) w Reflex to ID Panel     Status: None   Collection Time: 08/12/20  6:33 PM   Specimen:  BLOOD  Result Value Ref Range Status   Specimen Description BLOOD LEFT ANTECUBITAL  Final   Special Requests   Final    BOTTLES DRAWN AEROBIC AND ANAEROBIC Blood Culture adequate volume   Culture   Final    NO GROWTH 5 DAYS Performed at [facility], [address] [place], [place] [address]    Report Status 08/17/2020 FINAL  Final    Procedures and diagnostic studies:  No results found.  Medications:   . amLODipine  10 mg Oral Daily  . aspirin EC  81 mg Oral Daily  . clopidogrel  75 mg Oral Daily  . diclofenac Sodium  2 g Topical BID  . enalapril  20 mg Oral BID  . enoxaparin (LOVENOX) injection  40 mg Subcutaneous Q24H  . fenofibrate  160 mg Oral Daily  . Gerhardt's butt cream   Topical TID  . hydrALAZINE  25 mg Oral Q8H  . insulin aspart  0-9 Units Subcutaneous TID WC  . insulin glargine  18 Units Subcutaneous Daily  . metoprolol tartrate  50 mg Oral BID  . mometasone-formoterol  2 puff Inhalation BID  . pantoprazole  40 mg Oral Daily  . rosuvastatin  40 mg Oral Daily  . sertraline  100 mg Oral QHS  . sodium chloride flush  3 mL Intravenous Q12H  . umeclidinium bromide  1 puff Inhalation Daily   Continuous Infusions:   LOS: 6 days   Jacquelynn Cree, MD  [facility]   [facility] How to contact the [facility] Attending or [place] or covering provider during after hours [place], for this patient?  1. Check the care team in [facility] and look for a) attending/consulting TRH provider listed and b) the [facility] team listed 2. Log  into [email or web address] and use [place]'s universal password to access. If you do not have the password, please contact the hospital operator. 3. Locate the [facility] provider you are looking for under [facility] and page to a number that you can be directly reached. 4. If you still have difficulty reaching the provider, please page the [facility] (Director on Call) for the Hospitalists listed on amion for assistance.  08/19/2020, 8:33 AM

## 2020-08-20 ENCOUNTER — Ambulatory Visit: Payer: Medicare HMO | Admitting: Family Medicine

## 2020-08-20 DIAGNOSIS — I10 Essential (primary) hypertension: Secondary | ICD-10-CM | POA: Diagnosis not present

## 2020-08-20 LAB — BASIC METABOLIC PANEL
Anion gap: 12 (ref 5–15)
BUN: 18 mg/dL (ref 8–23)
CO2: 23 mmol/L (ref 22–32)
Calcium: 9.7 mg/dL (ref 8.9–10.3)
Chloride: 109 mmol/L (ref 98–111)
Creatinine, Ser: 0.77 mg/dL (ref 0.44–1.00)
GFR, Estimated: >60 mL/min (ref >60)
Glucose, Bld: 156 mg/dL — ABNORMAL HIGH (ref 70–99)
Potassium: 3.6 mmol/L (ref 3.5–5.1)
Sodium: 144 mmol/L (ref 135–145)

## 2020-08-20 LAB — GLUCOSE, CAPILLARY
Glucose-Capillary: 146 mg/dL — ABNORMAL HIGH (ref 70–99)
Glucose-Capillary: 221 mg/dL — ABNORMAL HIGH (ref 70–99)
Glucose-Capillary: 130 mg/dL — ABNORMAL HIGH (ref 70–99)
Glucose-Capillary: 125 mg/dL — ABNORMAL HIGH (ref 70–99)

## 2020-08-20 LAB — CBC
HCT: 43.9 % (ref 36.0–46.0)
Hemoglobin: 14 g/dL (ref 12.0–15.0)
MCH: 29.1 pg (ref 26.0–34.0)
MCHC: 31.9 g/dL (ref 30.0–36.0)
MCV: 91.3 fL (ref 80.0–100.0)
Platelets: 269 10*3/uL (ref 150–400)
RBC: 4.81 MIL/uL (ref 3.87–5.11)
RDW: 12.7 % (ref 11.5–15.5)
WBC: 8.5 10*3/uL (ref 4.0–10.5)
nRBC: 0 % (ref 0.0–0.2)

## 2020-08-20 LAB — FOLATE RBC
Folate, Hemolysate: 538 ng/mL
Folate, RBC: 1272 ng/mL (ref >498)
Hematocrit: 42.3 % (ref 34.0–46.6)

## 2020-08-20 LAB — RPR: RPR Ser Ql: NONREACTIVE

## 2020-08-20 NOTE — TOC Progression Note (Signed)
Transition of Care [facility]) - Progression Note    Patient Details  Name: Michelle [name] MRN: [id number] Date of Birth: 05-26-1933  Transition of Care [facility]) CM/SW [place], [place] Phone Number: 08/20/2020, 4:42 PM  Clinical Narrative:     CSW spoke with patient's son,David by phone- discussed SNF placement options, expanded SNF search, explained the SNF process  and answered all questions.  CSW will provide additional bed offers, if any CSW will continue to follow and assist with discharge planning.   Thurmond Butts, MSW, LCSW Clinical Social Worker   Expected Discharge Plan: Skilled Nursing Facility Barriers to Discharge: Continued Medical Work up  Expected Discharge Plan and Services Expected Discharge Plan: [place] In-house Referral: Clinical Social Work     Living arrangements for the past 2 months: Single Family Home                                       Social Determinants of Health (SDOH) Interventions    Readmission Risk Interventions No flowsheet data found.

## 2020-08-20 NOTE — TOC Progression Note (Addendum)
Transition of Care [facility]) - Progression Note    Patient Details  Name: Michelle [name] MRN: [id number] Date of Birth: 06-23-1933  Transition of Care [facility]) CM/SW [place], [place] Phone Number: 08/20/2020, 3:03 PM  Clinical Narrative:     CSW received call form patient's son- is is considering [facility]- He states he has some questions but the facility has not responded to his call. CSW contacted [facility] -requested she contact patient's son. CSW will follow up later today.  Patient will need to be out of restraints and with no sitter for 24 hrs before d/c to SNF. Patient will need covid test.  Thurmond Butts, MSW, LCSW Clinical Social Worker   Expected Discharge Plan: Skilled Nursing Facility Barriers to Discharge: Continued Medical Work up  Expected Discharge Plan and Services Expected Discharge Plan: [place] In-house Referral: Clinical Social Work     Living arrangements for the past 2 months: Single Family Home                                       Social Determinants of Health (SDOH) Interventions    Readmission Risk Interventions No flowsheet data found.

## 2020-08-20 NOTE — Progress Notes (Addendum)
Patient removing tele leads. Unable to redirect patient to stop. Unable to place back on patient due to patient swatting hands away. Sitter at bedside. Bilateral restraints were discontinued on day shift. Will attempt to place tele leads at a later time if patient allows. On call provider notified and made aware of patient refusing tele monitor at this time.

## 2020-08-20 NOTE — Progress Notes (Addendum)
  Speech Language Pathology Treatment: Dysphagia  Patient Details Name: Michelle [name] MRN: [id number] DOB: 09/05/1932 Today's Date: 08/20/2020 Time: 7416-3845 SLP Time Calculation (min) (ACUTE ONLY): 20 min  Assessment / Plan / Recommendation Clinical Impression  Pt was seen at bedside to assess appropriateness for advanced textures. Pt currently tolerating pureed solids and thin liquids without overt s/s aspiration. She accepted trials of graham cracker softened in pudding. Extended oral prep was noted with cracker, which may be safe for intermittent trials of solids, but advancing diet is not recommended at this time due to significant confusion and increased risk of aspiration. Will continue puree diet and thin liquids. ST signing off at this time. Please reconsult if needs arise.   HPI HPI: This is an 87 year old female with a past medical history of type 2 diabetes, hypertension, hyperlipidemia, CAD, MI who presented to the ED on 1/26 with complaints of altered mental status. Head CT and CXR 1/26 without acute findings      SLP Plan  Discharge SLP treatment due to all goals met       Recommendations  Diet recommendations: Dysphagia 1 (puree);Thin liquid Liquids provided via: Cup;Straw Medication Administration: Crushed with puree Supervision: Staff to assist with self feeding;Full supervision/cueing for compensatory strategies Compensations: Minimize environmental distractions;Slow rate;Small sips/bites Postural Changes and/or Swallow Maneuvers: Seated upright 90 degrees                SLP Visit Diagnosis: Dysphagia, oral phase (R13.11) Plan: Discharge SLP treatment due to all goals met       Michelle [name], [facility], [place] Speech Language Pathologist Office: [phone] Pager: [phone]  Michelle [name] 08/20/2020, 12:54 PM

## 2020-08-20 NOTE — Progress Notes (Signed)
Patient continues to have increased blood pressure. PRN 10mg  labetalol has been given as ordered.  Blood pressures: 0000 188/80  0025 179/81  0030 174/80 Manual.   0035 On call notified and made aware.

## 2020-08-20 NOTE — TOC Progression Note (Addendum)
Transition of Care [facility]) - Progression Note    Patient Details  Name: Michelle [name] MRN: [id number] Date of Birth: 1932-08-19  Transition of Care [facility]) CM/SW [place], [place] Phone Number: 08/20/2020, 3:54 PM  Clinical Narrative:     Received call from  [facility]/SNF  - they have NOT confirmed bed offer- they will review and inform CSW tomorrow.  CSW has requested other SNF to review clinicals for possible placement   Thurmond Butts, MSW, LCSW Clinical Social Worker    Expected Discharge Plan: Skilled Nursing Facility Barriers to Discharge: Continued Medical Work up  Expected Discharge Plan and Services Expected Discharge Plan: [place] In-house Referral: Clinical Social Work     Living arrangements for the past 2 months: Single Family Home                                       Social Determinants of Health (SDOH) Interventions    Readmission Risk Interventions No flowsheet data found.

## 2020-08-20 NOTE — Progress Notes (Signed)
Progress Note    Michelle [name]  WUJ:[id number] DOB: 1932/08/12  DOA: 08/12/2020 PCP: [name], MD    Brief Narrative:   Chief complaint: F/U altered mental status.  Medical records reviewed and are as summarized below:  Michelle [name] is an 87 y.o. female with a past medical history of type 2 diabetes, hypertension, hyperlipidemia, CAD, MI who presented to the ED via EMS on 08/12/20 after family became concerned that she was more confused, lethargic, and disoriented than usual baseline.  EMS noted combativeness and administered Versed. Of note: She was last admitted in 07/07/20 with delirium of uncertain etiology despite work-up. If was felt that uncontrolled BP was contributing to her delirium and that she had underlying dementia.EEG done 08/13/20 suggestive of mild diffuse encephalopathy and nonspecific etiology with no seizures or epileptiform discharges.  She was started on Zyprexa as needed for agitation during the day and Risperdal twice daily to reduce overall behavioral issues and restlessness.  At this time, she remains disoriented, restless, and needing restraint for safety.  Assessment/Plan:   Principle problem: Acute metabolic encephalopathy of unclear etiology, possibly hypertensive versus delirium in the setting of dementia, POA Previously reported as possible exacerbation of underlying dementia and possibly hypertension and worsened by medications (Ativan, Haldol, melatonin).  EEG consistent with diffuse encephalopathy.  CT unrevealing, only showing mild chronic white matter ischemic changes.  No evidence of UTI/infection. Sedating medications have been held, and the patient is not somnolent today, but is confused and restless.  She is a full-code. Per son, she was fairly functional prior to admit. She ambulated with a walker. Lives with son at baseline, who does the shopping. She did some cooking, but occasionally burned the food because she forgot she was cooking  it.   B12 folate etc. negative for metabolic causes This is probably all underlying dementia  Type 2 diabetes, with long-term use of insulin, with vascular & neurological complications/hyperglycemia, POA Currently being managed with  Lantus 18 units and sliding scale. CBGs 145-226.  Will add 4 units of meal coverage for better glycemic control.  Poor candidate for long-term Coverage given inconsistent meals  Hypertension, uncontrolled/chronic diastolic CHF, POA Continue Lopressor 50 mg twice daily, amlodipine 10 mg enalapril 20 mg twice daily and hydralazine 25 mg Q 8 hours.  May be benefiting from outpatient OSA rule out hyperaldosteronism being ruled out. F/U aldosterone/renin activity and ratio.  Lactic acidosis, resolved  Hypokalemia, resolved  Hyperlipidemia, POA Continue Crestor and fenofibrate.  CAD, POA Continue aspirin, Plavix, beta blocker.  Progression Remains confused but out of restraints.  Continue to control BP and have asked nursing to waken her and feed her if sleeping. At risk for malnutrition given poor intake. Has 1 SNF offer, and should qualify based on her prior level of function per son's report.   Family Communication/Anticipated D/C date and plan/Code Status   DVT prophylaxis: enoxaparin (LOVENOX) injection 40 mg Start: 08/12/20 1515   Current Level of Care:: Telemetry Medical Code Status: Full Code.  Family Communication: Son updated by telephone and aware of plan for SNF placement. Disposition Plan: Status is: Inpatient  Remains inpatient appropriate because:Altered mental status, remains in restraints for safety.   Dispo:  Patient From: Home  Planned Disposition: [place]  Expected discharge date: 08/21/2020  Medically stable for discharge: No     Medical Consultants:    Neurology   Anti-Infectives:    None  Subjective:   Somewhat confused however can  name the date the time and the month correctly-thinks that she  is at a toy store Otherwise seems well  Ate some today  Objective:    Vitals:   08/20/20 0410 08/20/20 0634 08/20/20 0748 08/20/20 1135  BP: (!) 183/76 (!) 175/71 (!) 158/67 129/60  Pulse: (!) 41 82 73   Resp: (!) 21 20 20    Temp:   97.8 F (36.6 C) 98.9 F (37.2 C)  TempSrc:   Oral Oral  SpO2: 99% 97% 94%     Intake/Output Summary (Last 24 hours) at 08/20/2020 1441 Last data filed at 08/20/2020 1247 Gross per 24 hour  Intake 360 ml  Output 150 ml  Net 210 ml   There were no vitals filed for this visit.  Exam:  Awake coherent x1 to Somewhat disheveled EOMI NCAT Trying to get out of bed Chest clear no added sound Slightly distended abdomen No lower extremity edema      Data Reviewed:   I have personally reviewed following labs and imaging studies:  Labs: Labs show the following:   Basic Metabolic Panel: Recent Labs  Lab 08/15/20 0912 08/15/20 1754 08/17/20 0301 08/18/20 0706 08/19/20 0141 08/20/20 0413  NA 144  --  143 144 142 144  K 3.7  --  3.1* 3.8 3.8 3.6  CL 112*  --  109 112* 110 109  CO2 22  --  21* 21* 21* 23  GLUCOSE 166*  --  128* 163* 167* 156*  BUN 11  --  21 20 19 18   CREATININE 0.72  --  0.78 0.76 0.72 0.77  CALCIUM 9.6  --  9.5 9.7 10.0 9.7  MG  --  1.8  --   --   --   --    GFR CrCl cannot be calculated (Unknown ideal weight.). Liver Function Tests: No results for input(s): AST, ALT, ALKPHOS, BILITOT, PROT, ALBUMIN in the last 168 hours.  No results for input(s): AMMONIA in the last 168 hours. CBC: Recent Labs  Lab 08/15/20 0912 08/17/20 0301 08/18/20 0706 08/19/20 0141 08/19/20 1330 08/20/20 0413  WBC 7.8 9.3 8.5 7.8  --  8.5  HGB 13.9 14.0 14.0 14.5  --  14.0  HCT 40.2 41.3 41.1 46.3* 42.3 43.9  MCV 88.2 89.6 88.4 93.0  --  91.3  PLT 257 262 259 228  --  269   CBG: Recent Labs  Lab 08/19/20 1150 08/19/20 1712 08/19/20 2110 08/20/20 0746 08/20/20 1152  GLUCAP 168* 180* 120* 146* 221*   Sepsis Labs: Recent  Labs  Lab 08/17/20 0301 08/18/20 0706 08/19/20 0141 08/20/20 0413  WBC 9.3 8.5 7.8 8.5    Microbiology Recent Results (from the past 240 hour(s))  Culture, blood (Routine X 2) w Reflex to ID Panel     Status: None   Collection Time: 08/12/20  9:40 AM   Specimen: BLOOD  Result Value Ref Range Status   Specimen Description BLOOD RIGHT ANTECUBITAL  Final   Special Requests   Final    BOTTLES DRAWN AEROBIC AND ANAEROBIC Blood Culture adequate volume   Culture   Final    NO GROWTH 5 DAYS Performed at [facility], [place] [address], [place], [place] [address]    Report Status 08/17/2020 FINAL  Final  Urine Culture     Status: Abnormal   Collection Time: 08/12/20 10:08 AM   Specimen: Urine, Random  Result Value Ref Range Status   Specimen Description URINE, RANDOM  Final   Special  Requests   Final    NONE Performed at [facility], [place] [address], [place], [place] [address]    Culture (A)  Final    10,000 COLONIES/mL MULTIPLE SPECIES PRESENT, SUGGEST RECOLLECTION   Report Status 08/13/2020 FINAL  Final  SARS Coronavirus 2 by RT PCR (hospital order, performed in [facility] hospital lab) Nasopharyngeal     Status: None   Collection Time: 08/12/20 10:08 AM   Specimen: Nasopharyngeal  Result Value Ref Range Status   SARS Coronavirus 2 NEGATIVE NEGATIVE Final    Comment: (NOTE) SARS-CoV-2 target nucleic acids are NOT DETECTED.  The SARS-CoV-2 RNA is generally detectable in upper and lower respiratory specimens during the acute phase of infection. The lowest concentration of SARS-CoV-2 viral copies this assay can detect is 250 copies / mL. A negative result does not preclude SARS-CoV-2 infection and should not be used as the sole basis for treatment or other patient management decisions.  A negative result may occur with improper specimen collection / handling, submission of specimen other than nasopharyngeal swab, presence of viral mutation(s) within the areas  targeted by this assay, and inadequate number of viral copies (<250 copies / mL). A negative result must be combined with clinical observations, patient history, and epidemiological information.  Fact Sheet for Patients:   StrictlyIdeas.no  Fact Sheet for Healthcare Providers: BankingDealers.co.za  This test is not yet approved or  cleared by the [place] FDA and has been authorized for detection and/or diagnosis of SARS-CoV-2 by FDA under an Emergency Use Authorization (EUA).  This EUA will remain in effect (meaning this test can be used) for the duration of the COVID-19 declaration under Section 564(b)(1) of the Act, 21 U.S.C. section 360bbb-3(b)(1), unless the authorization is terminated or revoked sooner.  Performed at [facility], [place] [address] [place], [place] [address]   MRSA PCR Screening     Status: None   Collection Time: 08/12/20  5:04 PM   Specimen: Nasal Mucosa; Nasopharyngeal  Result Value Ref Range Status   MRSA by PCR NEGATIVE NEGATIVE Final    Comment:        The GeneXpert MRSA Assay (FDA approved for NASAL specimens only), is one component of a comprehensive MRSA colonization surveillance program. It is not intended to diagnose MRSA infection nor to guide or monitor treatment for MRSA infections. Performed at [facility], [place] [address], [place], [place] [address]   Culture, blood (Routine X 2) w Reflex to ID Panel     Status: None   Collection Time: 08/12/20  6:33 PM   Specimen: BLOOD  Result Value Ref Range Status   Specimen Description BLOOD LEFT ANTECUBITAL  Final   Special Requests   Final    BOTTLES DRAWN AEROBIC AND ANAEROBIC Blood Culture adequate volume   Culture   Final    NO GROWTH 5 DAYS Performed at [facility], [place] [address], [place], [place] [address]    Report Status 08/17/2020 FINAL  Final    Procedures and diagnostic studies:  No results  found.  Medications:   . amLODipine  10 mg Oral Daily  . aspirin EC  81 mg Oral Daily  . clopidogrel  75 mg Oral Daily  . diclofenac Sodium  2 g Topical BID  . enalapril  20 mg Oral BID  . enoxaparin (LOVENOX) injection  40 mg Subcutaneous Q24H  . fenofibrate  160 mg Oral Daily  . Gerhardt's butt cream  Topical TID  . hydrALAZINE  50 mg Oral Q8H  . insulin aspart  0-9 Units Subcutaneous TID WC  . insulin aspart  4 Units Subcutaneous TID WC  . insulin glargine  18 Units Subcutaneous Daily  . metoprolol tartrate  50 mg Oral BID  . mometasone-formoterol  2 puff Inhalation BID  . pantoprazole  40 mg Oral Daily  . rosuvastatin  40 mg Oral Daily  . sertraline  100 mg Oral QHS  . sodium chloride flush  3 mL Intravenous Q12H  . umeclidinium bromide  1 puff Inhalation Daily   Continuous Infusions:   LOS: 7 days   Nita Sells, MD  [facility]   [facility] How to contact the [facility] Attending or [place] or covering provider during after hours [place], for this patient?  1. Check the care team in [facility] and look for a) attending/consulting TRH provider listed and b) the [facility] team listed 2. Log into [email or web address] and use [place]'s universal password to access. If you do not have the password, please contact the hospital operator. 3. Locate the [facility] provider you are looking for under [facility] and page to a number that you can be directly reached. 4. If you still have difficulty reaching the provider, please page the [facility] (Director on Call) for the Hospitalists listed on amion for assistance.  08/20/2020, 2:41 PM

## 2020-08-21 DIAGNOSIS — I10 Essential (primary) hypertension: Secondary | ICD-10-CM | POA: Diagnosis not present

## 2020-08-21 LAB — ALDOSTERONE + RENIN ACTIVITY W/ RATIO
ALDO / PRA Ratio: 11.1 (ref 0.0–30.0)
Aldosterone: 4.1 ng/dL (ref 0.0–30.0)
PRA LC/MS/MS: 0.368 ng/mL/hr (ref 0.167–5.380)

## 2020-08-21 LAB — BASIC METABOLIC PANEL
Anion gap: 10 (ref 5–15)
BUN: 24 mg/dL — ABNORMAL HIGH (ref 8–23)
CO2: 24 mmol/L (ref 22–32)
Calcium: 9.6 mg/dL (ref 8.9–10.3)
Chloride: 110 mmol/L (ref 98–111)
Creatinine, Ser: 0.85 mg/dL (ref 0.44–1.00)
GFR, Estimated: >60 mL/min (ref >60)
Glucose, Bld: 131 mg/dL — ABNORMAL HIGH (ref 70–99)
Potassium: 3.6 mmol/L (ref 3.5–5.1)
Sodium: 144 mmol/L (ref 135–145)

## 2020-08-21 LAB — CBC
HCT: 40.7 % (ref 36.0–46.0)
Hemoglobin: 13.7 g/dL (ref 12.0–15.0)
MCH: 30.2 pg (ref 26.0–34.0)
MCHC: 33.7 g/dL (ref 30.0–36.0)
MCV: 89.6 fL (ref 80.0–100.0)
Platelets: 272 10*3/uL (ref 150–400)
RBC: 4.54 MIL/uL (ref 3.87–5.11)
RDW: 12.8 % (ref 11.5–15.5)
WBC: 11 10*3/uL — ABNORMAL HIGH (ref 4.0–10.5)
nRBC: 0 % (ref 0.0–0.2)

## 2020-08-21 LAB — GLUCOSE, CAPILLARY
Glucose-Capillary: 144 mg/dL — ABNORMAL HIGH (ref 70–99)
Glucose-Capillary: 226 mg/dL — ABNORMAL HIGH (ref 70–99)
Glucose-Capillary: 136 mg/dL — ABNORMAL HIGH (ref 70–99)
Glucose-Capillary: 199 mg/dL — ABNORMAL HIGH (ref 70–99)

## 2020-08-21 NOTE — TOC Progression Note (Signed)
Transition of Care [facility]) - Progression Note    Patient Details  Name: Michelle [name] MRN: [id number] Date of Birth: 1933-07-18  Transition of Care [facility]) CM/SW [place], [place] Phone Number: 08/21/2020, 4:02 PM  Clinical Narrative:     CSW res-sent clinicals to [facility] per request of family- Called [facility] -left voice message w/Kerri request return call.   Thurmond Butts, MSW, LCSW Clinical Social Worker    Expected Discharge Plan: Skilled Nursing Facility Barriers to Discharge: Continued Medical Work up  Expected Discharge Plan and Services Expected Discharge Plan: [place] In-house Referral: Clinical Social Work     Living arrangements for the past 2 months: Single Family Home                                       Social Determinants of Health (SDOH) Interventions    Readmission Risk Interventions No flowsheet data found.

## 2020-08-21 NOTE — Progress Notes (Signed)
Progress Note    Michelle [name]  KDT:[id number] DOB: [birth year]  DOA: 08/12/2020 PCP: [name], MD    Brief Narrative:   Chief complaint: F/U altered mental status.  Medical records reviewed and are as summarized below:  Michelle [name] is an 87 y.o. female with a past medical history of type 2 diabetes, hypertension, hyperlipidemia, CAD, MI who presented to the ED via EMS on 08/12/20 after family became concerned that she was more confused, lethargic, and disoriented than usual baseline.  EMS noted combativeness and administered Versed. Of note: She was last admitted in 07/07/20 with delirium of uncertain etiology despite work-up. If was felt that uncontrolled BP was contributing to her delirium and that she had underlying dementia.EEG done 08/13/20 suggestive of mild diffuse encephalopathy and nonspecific etiology with no seizures or epileptiform discharges.  She was started on Zyprexa as needed for agitation during the day and Risperdal twice daily to reduce overall behavioral issues and restlessness.  Assessment/Plan:   Principle problem: Acute metabolic encephalopathy of unclear etiology, possibly hypertensive versus delirium in the setting of dementia, POA Previously reported as possible exacerbation of underlying dementia and possibly hypertension and worsened by medications (Ativan, Haldol, melatonin).  EEG consistent with diffuse encephalopathy.  CT unrevealing, only showing mild chronic white matter ischemic changes.  No evidence of UTI/infection. Sedating medications have been held, and the patient is not somnolent today, but is confused  She is a full-code. Per son, she was fairly functional prior to admit. She ambulated with a walker. Lives with son at baseline, who does the shopping. She did some cooking, but occasionally burned the food because she forgot she was cooking it.   B12 folate etc. negative for metabolic causes This is probably all underlying dementia.   Patient is awake and she knows that she is in the hospital.  She was unable to answer any other questions regarding orientation.  She was slow in response but awake all the time and was pleasant.  Type 2 diabetes, with long-term use of insulin, with vascular & neurological complications/hyperglycemia, POA Currently being managed with  Lantus 18 units and sliding scale and 4 units of meal coverage.  Blood sugar fairly controlled.  Hypertension, uncontrolled/chronic diastolic CHF, POA Blood pressure very well controlled, continue Lopressor 50 mg twice daily, amlodipine 10 mg enalapril 20 mg twice daily and hydralazine 50 mg Q 8 hours.  Aldosterone/renin ratio normal.  Lactic acidosis, resolved  Hypokalemia, resolved  Hyperlipidemia, POA Continue Crestor and fenofibrate.  CAD, POA Continue aspirin and beta-blocker.  Holding Plavix.  Bright red blood per rectum: Received a message from nurses that patient was noted to have 1 episode of bright red blood per rectum.  Will discontinue Lovenox and Plavix.  Monitor hemoglobin closely.  Hemoglobin is stable so far.  Progression Remains confused but out of restraints.  Continue to control BP and have asked nursing to waken her and feed her if sleeping. At risk for malnutrition given poor intake.  TOC working on SNF.   Family Communication/Anticipated D/C date and plan/Code Status   DVT prophylaxis: We will order SCD, discontinue Lovenox as mentioned above.   Current Level of Care:: Telemetry Medical Code Status: Full Code.  Family Communication: None at bedside placement. Disposition Plan: Status is: Inpatient  Remains inpatient appropriate because:Altered mental status, remains in restraints for safety.   Dispo:  Patient From: Home  Planned Disposition: [place]  Expected discharge date: 08/21/2020  Medically stable for  discharge: Yes    Medical Consultants:    Neurology   Anti-Infectives:     None  Subjective:   Patient seen and examined.  She is fully alert but pleasantly confused.  Oriented to place only.  No complaints.  Objective:    Vitals:   08/20/20 1748 08/20/20 2043 08/21/20 0733 08/21/20 1145  BP: 124/70 (!) 156/71 107/78 118/61  Pulse: 81  74 83  Resp: 15 15 18 18   Temp: 99.1 F (37.3 C) 98.6 F (37 C) 98.9 F (37.2 C) 97.9 F (36.6 C)  TempSrc: Oral Oral Axillary Oral  SpO2: 96%  97% 94%   No intake or output data in the 24 hours ending 08/21/20 1418 There were no vitals filed for this visit.  Exam: General exam: Appears calm and comfortable  Respiratory system: Clear to auscultation. Respiratory effort normal. Cardiovascular system: S1 & S2 heard, RRR. No JVD, murmurs, rubs, gallops or clicks. No pedal edema. Gastrointestinal system: Abdomen is nondistended, soft and nontender. No organomegaly or masses felt. Normal bowel sounds heard. Central nervous system: Alert but pleasantly confused.  No focal deficit. Extremities: Symmetric 5 x 5 power. Skin: No rashes, lesions or ulcers.  Psychiatry: Judgement and insight appear poor       Data Reviewed:   I have personally reviewed following labs and imaging studies:  Labs: Labs show the following:   Basic Metabolic Panel: Recent Labs  Lab 08/15/20 1754 08/17/20 0301 08/18/20 0706 08/19/20 0141 08/20/20 0413 08/21/20 0225  NA  --  143 144 142 144 144  K  --  3.1* 3.8 3.8 3.6 3.6  CL  --  109 112* 110 109 110  CO2  --  21* 21* 21* 23 24  GLUCOSE  --  128* 163* 167* 156* 131*  BUN  --  21 20 19 18  24*  CREATININE  --  0.78 0.76 0.72 0.77 0.85  CALCIUM  --  9.5 9.7 10.0 9.7 9.6  MG 1.8  --   --   --   --   --    GFR CrCl cannot be calculated (Unknown ideal weight.). Liver Function Tests: No results for input(s): AST, ALT, ALKPHOS, BILITOT, PROT, ALBUMIN in the last 168 hours.  No results for input(s): AMMONIA in the last 168 hours. CBC: Recent Labs  Lab 08/17/20 0301  08/18/20 0706 08/19/20 0141 08/19/20 1330 08/20/20 0413 08/21/20 0225  WBC 9.3 8.5 7.8  --  8.5 11.0*  HGB 14.0 14.0 14.5  --  14.0 13.7  HCT 41.3 41.1 46.3* 42.3 43.9 40.7  MCV 89.6 88.4 93.0  --  91.3 89.6  PLT 262 259 228  --  269 272   CBG: Recent Labs  Lab 08/20/20 1152 08/20/20 1554 08/20/20 2145 08/21/20 0732 08/21/20 1143  GLUCAP 221* 130* 125* 144* 226*   Sepsis Labs: Recent Labs  Lab 08/18/20 0706 08/19/20 0141 08/20/20 0413 08/21/20 0225  WBC 8.5 7.8 8.5 11.0*    Microbiology Recent Results (from the past 240 hour(s))  Culture, blood (Routine X 2) w Reflex to ID Panel     Status: None   Collection Time: 08/12/20  9:40 AM   Specimen: BLOOD  Result Value Ref Range Status   Specimen Description BLOOD RIGHT ANTECUBITAL  Final   Special Requests   Final    BOTTLES DRAWN AEROBIC AND ANAEROBIC Blood Culture adequate volume   Culture   Final    NO GROWTH 5 DAYS Performed at [facility], [address] [place], [place] [address]    Report Status 08/17/2020 FINAL  Final  Urine Culture     Status: Abnormal   Collection Time: 08/12/20 10:08 AM   Specimen: Urine, Random  Result Value Ref Range Status   Specimen Description URINE, RANDOM  Final   Special Requests   Final    NONE Performed at [facility], [place] [address], [place], [place] [address]    Culture (A)  Final    10,000 COLONIES/mL MULTIPLE SPECIES PRESENT, SUGGEST RECOLLECTION   Report Status 08/13/2020 FINAL  Final  SARS Coronavirus 2 by RT PCR (hospital order, performed in [facility] hospital lab) Nasopharyngeal     Status: None   Collection Time: 08/12/20 10:08 AM   Specimen: Nasopharyngeal  Result Value Ref Range Status   SARS Coronavirus 2 NEGATIVE NEGATIVE Final    Comment: (NOTE) SARS-CoV-2 target nucleic acids are NOT DETECTED.  The SARS-CoV-2 RNA is generally detectable in upper and lower respiratory specimens during the acute phase of infection. The  lowest concentration of SARS-CoV-2 viral copies this assay can detect is 250 copies / mL. A negative result does not preclude SARS-CoV-2 infection and should not be used as the sole basis for treatment or other patient management decisions.  A negative result may occur with improper specimen collection / handling, submission of specimen other than nasopharyngeal swab, presence of viral mutation(s) within the areas targeted by this assay, and inadequate number of viral copies (<250 copies / mL). A negative result must be combined with clinical observations, patient history, and epidemiological information.  Fact Sheet for Patients:   StrictlyIdeas.no  Fact Sheet for Healthcare Providers: BankingDealers.co.za  This test is not yet approved or  cleared by the [place] FDA and has been authorized for detection and/or diagnosis of SARS-CoV-2 by FDA under an Emergency Use Authorization (EUA).  This EUA will remain in effect (meaning this test can be used) for the duration of the COVID-19 declaration under Section 564(b)(1) of the Act, 21 U.S.C. section 360bbb-3(b)(1), unless the authorization is terminated or revoked sooner.  Performed at [facility], [place] [address] [place], [place] [address]   MRSA PCR Screening     Status: None   Collection Time: 08/12/20  5:04 PM   Specimen: Nasal Mucosa; Nasopharyngeal  Result Value Ref Range Status   MRSA by PCR NEGATIVE NEGATIVE Final    Comment:        The GeneXpert MRSA Assay (FDA approved for NASAL specimens only), is one component of a comprehensive MRSA colonization surveillance program. It is not intended to diagnose MRSA infection nor to guide or monitor treatment for MRSA infections. Performed at [facility], [place] [address], [place], [place] [address]   Culture, blood (Routine X 2) w Reflex to ID Panel     Status: None   Collection Time: 08/12/20  6:33 PM    Specimen: BLOOD  Result Value Ref Range Status   Specimen Description BLOOD LEFT ANTECUBITAL  Final   Special Requests   Final    BOTTLES DRAWN AEROBIC AND ANAEROBIC Blood Culture adequate volume   Culture   Final    NO GROWTH 5 DAYS Performed at [facility], [place] [address] [place],  [address]    Report Status 08/17/2020 FINAL  Final    Procedures and diagnostic studies:  No results found.  Medications:   . amLODipine  10 mg Oral Daily  . aspirin EC  81  mg Oral Daily  . diclofenac Sodium  2 g Topical BID  . enalapril  20 mg Oral BID  . fenofibrate  160 mg Oral Daily  . Gerhardt's butt cream   Topical TID  . hydrALAZINE  50 mg Oral Q8H  . insulin aspart  0-9 Units Subcutaneous TID WC  . insulin aspart  4 Units Subcutaneous TID WC  . insulin glargine  18 Units Subcutaneous Daily  . metoprolol tartrate  50 mg Oral BID  . mometasone-formoterol  2 puff Inhalation BID  . pantoprazole  40 mg Oral Daily  . rosuvastatin  40 mg Oral Daily  . sertraline  100 mg Oral QHS  . sodium chloride flush  3 mL Intravenous Q12H  . umeclidinium bromide  1 puff Inhalation Daily   Continuous Infusions:   LOS: 8 days   Darliss Cheney, MD  [facility]   [facility] How to contact the [facility] Attending or Consulting provider [place] or covering provider during after hours [place], for this patient?  1. Check the care team in [facility] and look for a) attending/consulting TRH provider listed and b) the [facility] team listed 2. Log into [email or web address] and use [place]'s universal password to access. If you do not have the password, please contact the hospital operator. 3. Locate the [facility] provider you are looking for under [facility] and page to a number that you can be directly reached. 4. If you still have difficulty reaching the provider, please page the [facility] (Director on Call) for the Hospitalists listed on amion for assistance.  08/21/2020, 2:18 PM

## 2020-08-21 NOTE — Progress Notes (Signed)
Physical Therapy Treatment Patient Details Name: Michelle [name] MRN: [id number] DOB: 1932/11/07 Today's Date: 08/21/2020    History of Present Illness 87 year old female with a past medical history of type 2 diabetes, hypertension, hyperlipidemia, CAD, MI who presented to the ED on 1/26 with complaints of altered mental status    PT Comments    The pt was able to make great progress with mobility at this session. She was able to follow commands with improved consistency, and able to come to sitting EOB with minA and use of bed rails (improved from maxA of 2 at prior session). The pt was then able to complete sit-stand transfer to recliner and short bout of forward and lateral steps to reach recliner, but required maxA of 2 to maintain upright and verbal and tactile cues to BLE to advance. The pt will continue to benefit from skilled PT to progress functional strength, endurance, power, and stability, to allow for improved safety and decreased caregiver burden with transfers.    Follow Up Recommendations  SNF;Supervision/Assistance - 24 hour     Equipment Recommendations   (defer to post acute)    Recommendations for Other Services       Precautions / Restrictions Precautions Precautions: Fall Restrictions Weight Bearing Restrictions: No    Mobility  Bed Mobility Overal bed mobility: Needs Assistance Bed Mobility: Supine to Sit     Supine to sit: Min assist;HOB elevated     General bed mobility comments: minA to initiate roll, but pt able to reach for bed rail and pull hips to EOB, then push to sit with minA. minA to minG to steady while sitting EOB. pt able to scoot forward to place her feet on ground.  Transfers Overall transfer level: Needs assistance Equipment used: 2 person hand held assist Transfers: Sit to/from [facility] Sit to Stand: Mod assist;+2 physical assistance;+2 safety/equipment Stand pivot transfers: Max assist;+2 physical assistance;+2  safety/equipment       General transfer comment: modA to power up but maxA with tactile cues to maintain standing and to complete small lateral pivot to recliner. benefits from increased time, cues for posture, and tactile cues at LE to mobilize.  Ambulation/Gait Ambulation/Gait assistance: Max assist;+2 physical assistance;+2 safety/equipment Gait Distance (Feet): 3 Feet Assistive device: 2 person hand held assist Gait Pattern/deviations: Step-to pattern;Decreased stride length;Decreased weight shift to right;Decreased weight shift to left;Trunk flexed;Narrow base of support Gait velocity: decreased   General Gait Details: pt with narrow BOS, requiring multimodal cues to take small forward and lateral steps with assist to facilitate wt shift and tactile cues to advance BLE.       Balance Overall balance assessment: Needs assistance Sitting-balance support: Bilateral upper extremity supported;Feet supported Sitting balance-Leahy Scale: Fair Sitting balance - Comments: progressed from minA to minG with intermittent use of UE for support   Standing balance support: Bilateral upper extremity supported Standing balance-Leahy Scale: Poor Standing balance comment: reliant on BUE support and maxA of 2                            Cognition Arousal/Alertness: Awake/alert;Lethargic Behavior During Therapy: Flat affect Overall Cognitive Status: Impaired/Different from baseline Area of Impairment: Orientation;Attention;Following commands                 Orientation Level: Disoriented to;Time Current Attention Level: Focused   Following Commands: Follows one step commands with increased time Safety/Judgement: Decreased awareness of safety;Decreased awareness of deficits  Problem Solving: Slow processing;Decreased initiation;Difficulty sequencing;Requires verbal cues;Requires tactile cues General Comments: pt able to state she is in hospital but unsure which one, able to  name cone when told she was in [place]. generally able to follow commands with increased time, at times benefits from increased verbal, visual, or tactile cues to complete.      Exercises      General Comments General comments (skin integrity, edema, etc.): VSS through session. pt with increased flat affect and decreased verbal response through session, stating fatigue      Pertinent Vitals/Pain Pain Assessment: Faces Faces Pain Scale: Hurts a little bit Pain Location: generalized during mobility Pain Intervention(s): Monitored during session;Repositioned           PT Goals (current goals can now be found in the care plan section) Acute Rehab PT Goals Patient Stated Goal: to go home once she can go safely PT Goal Formulation: With family Time For Goal Achievement: 08/30/20 Potential to Achieve Goals: Fair Progress towards PT goals: Progressing toward goals    Frequency    Min 2X/week      PT Plan Current plan remains appropriate       AM-PAC PT "6 Clicks" Mobility   Outcome Measure  Help needed turning from your back to your side while in a flat bed without using bedrails?: A Little Help needed moving from lying on your back to sitting on the side of a flat bed without using bedrails?: A Little Help needed moving to and from a bed to a chair (including a wheelchair)?: A Lot Help needed standing up from a chair using your arms (e.g., wheelchair or bedside chair)?: A Lot Help needed to walk in hospital room?: Total Help needed climbing 3-5 steps with a railing? : Total 6 Click Score: 12    End of Session Equipment Utilized During Treatment: Gait belt Activity Tolerance: Patient limited by fatigue;Patient limited by lethargy Patient left: in chair;with call bell/phone within reach;with chair alarm set Nurse Communication: Mobility status PT Visit Diagnosis: Difficulty in walking, not elsewhere classified (R26.2);Muscle weakness (generalized) (M62.81);Other  symptoms and signs involving the nervous system (R29.898)     Time: 1005-1030 PT Time Calculation (min) (ACUTE ONLY): 25 min  Charges:  $Gait Training: 8-22 mins $Therapeutic Activity: 8-22 mins                     Karma Ganja, PT, DPT   Acute Rehabilitation Department Pager #: [phone]   Otho Bellows 08/21/2020, 5:40 PM

## 2020-08-21 NOTE — Progress Notes (Signed)
Pt noted to have small amount of what appears to be bright red blood in stool. MD notified and aware.

## 2020-08-21 NOTE — TOC Progression Note (Signed)
Transition of Care [facility]) - Progression Note    Patient Details  Name: CHRISTOL THETFORD MRN: [id number] Date of Birth: 1933/06/14  Transition of Care [facility]) CM/SW [place], [place] Phone Number: 08/21/2020, 3:27 PM  Clinical Narrative:     CSW spoke with Admission/[facility] - they expressed concerns about patient's being in restraints/ sitter and behaviors( and she is high fall risk) SNF advised waiting to determine if patient behaviors improves and with out any behaviors. They request CSW follow up beginning of next week.  [facility] has declined   CSW will continue to follow and assist with discharge planning.  Expected Discharge Plan: [place] Barriers to Discharge: Continued Medical Work up  Expected Discharge Plan and Services Expected Discharge Plan: [place] In-house Referral: Clinical Social Work     Living arrangements for the past 2 months: Single Family Home                                       Social Determinants of Health (SDOH) Interventions    Readmission Risk Interventions No flowsheet data found.

## 2020-08-21 NOTE — Plan of Care (Signed)

## 2020-08-22 DIAGNOSIS — I10 Essential (primary) hypertension: Secondary | ICD-10-CM | POA: Diagnosis not present

## 2020-08-22 LAB — CBC WITH DIFFERENTIAL/PLATELET
Abs Immature Granulocytes: 0.04 10*3/uL (ref 0.00–0.07)
Basophils Absolute: 0.1 10*3/uL (ref 0.0–0.1)
Basophils Relative: 1 %
Eosinophils Absolute: 0.1 10*3/uL (ref 0.0–0.5)
Eosinophils Relative: 2 %
HCT: 39.9 % (ref 36.0–46.0)
Hemoglobin: 12.9 g/dL (ref 12.0–15.0)
Immature Granulocytes: 1 %
Lymphocytes Relative: 26 %
Lymphs Abs: 2.1 10*3/uL (ref 0.7–4.0)
MCH: 29.3 pg (ref 26.0–34.0)
MCHC: 32.3 g/dL (ref 30.0–36.0)
MCV: 90.7 fL (ref 80.0–100.0)
Monocytes Absolute: 0.9 10*3/uL (ref 0.1–1.0)
Monocytes Relative: 11 %
Neutro Abs: 4.8 10*3/uL (ref 1.7–7.7)
Neutrophils Relative %: 59 %
Platelets: 261 10*3/uL (ref 150–400)
RBC: 4.4 MIL/uL (ref 3.87–5.11)
RDW: 12.5 % (ref 11.5–15.5)
WBC: 8 10*3/uL (ref 4.0–10.5)
nRBC: 0 % (ref 0.0–0.2)

## 2020-08-22 LAB — GLUCOSE, CAPILLARY
Glucose-Capillary: 133 mg/dL — ABNORMAL HIGH (ref 70–99)
Glucose-Capillary: 151 mg/dL — ABNORMAL HIGH (ref 70–99)
Glucose-Capillary: 187 mg/dL — ABNORMAL HIGH (ref 70–99)
Glucose-Capillary: 218 mg/dL — ABNORMAL HIGH (ref 70–99)
Glucose-Capillary: 205 mg/dL — ABNORMAL HIGH (ref 70–99)

## 2020-08-22 LAB — BASIC METABOLIC PANEL
Anion gap: 10 (ref 5–15)
BUN: 25 mg/dL — ABNORMAL HIGH (ref 8–23)
CO2: 23 mmol/L (ref 22–32)
Calcium: 9.3 mg/dL (ref 8.9–10.3)
Chloride: 106 mmol/L (ref 98–111)
Creatinine, Ser: 0.92 mg/dL (ref 0.44–1.00)
GFR, Estimated: >60 mL/min (ref >60)
Glucose, Bld: 218 mg/dL — ABNORMAL HIGH (ref 70–99)
Potassium: 3.1 mmol/L — ABNORMAL LOW (ref 3.5–5.1)
Sodium: 139 mmol/L (ref 135–145)

## 2020-08-22 LAB — MAGNESIUM: Magnesium: 1.7 mg/dL (ref 1.7–2.4)

## 2020-08-22 MED ORDER — HALOPERIDOL LACTATE 5 MG/ML IJ SOLN
5.0000 mg | Freq: Once | INTRAMUSCULAR | Status: AC
  Administered 2020-08-22: 5 mg via INTRAVENOUS
  Filled 2020-08-22: qty 1

## 2020-08-22 NOTE — Progress Notes (Signed)
Progress Note    Michelle [name]  EVO:[id number] DOB: 1933/06/07  DOA: 08/12/2020 PCP: [name], MD    Brief Narrative:   Chief complaint: F/U altered mental status.  Medical records reviewed and are as summarized below:  Michelle [name] is an 87 y.o. female with a past medical history of type 2 diabetes, hypertension, hyperlipidemia, CAD, MI who presented to the ED via EMS on 08/12/20 after family became concerned that she was more confused, lethargic, and disoriented than usual baseline.  EMS noted combativeness and administered Versed. Of note: She was last admitted in 07/07/20 with delirium of uncertain etiology despite work-up. If was felt that uncontrolled BP was contributing to her delirium and that she had underlying dementia.EEG done 08/13/20 suggestive of mild diffuse encephalopathy and nonspecific etiology with no seizures or epileptiform discharges.  She was started on Zyprexa as needed for agitation during the day and Risperdal twice daily to reduce overall behavioral issues and restlessness.  Assessment/Plan:   Principle problem: Acute metabolic encephalopathy of unclear etiology, possibly hypertensive versus delirium in the setting of dementia, POA Previously reported as possible exacerbation of underlying dementia and possibly hypertension and worsened by medications (Ativan, Haldol, melatonin).  EEG consistent with diffuse encephalopathy.  CT unrevealing, only showing mild chronic white matter ischemic changes.  No evidence of UTI/infection. Sedating medications have been held, and the patient is not somnolent today, but is confused  She is a full-code. Per son, she was fairly functional prior to admit. She ambulated with a walker. Lives with son at baseline, who does the shopping. She did some cooking, but occasionally burned the food because she forgot she was cooking it.   B12 folate etc. negative for metabolic causes This is probably all underlying dementia.   Patient has progressed since yesterday.  Currently she is alert, oriented, pleasant and answering all the questions.  Type 2 diabetes, with long-term use of insulin, with vascular & neurological complications/hyperglycemia, POA Currently being managed with  Lantus 18 units and sliding scale and 4 units of meal coverage.  Blood sugar fairly controlled.  Hypertension, uncontrolled/chronic diastolic CHF, POA Blood pressure very well controlled for most part, continue Lopressor 50 mg twice daily, amlodipine 10 mg enalapril 20 mg twice daily and hydralazine 50 mg Q 8 hours.  Aldosterone/renin ratio normal.  Lactic acidosis, resolved  Hypokalemia, resolved, repeating labs today.  Hyperlipidemia, POA Continue Crestor and fenofibrate.  CAD, POA Continue aspirin and beta-blocker.  Holding Plavix.  Bright red blood per rectum: Reportedly patient had one episode of bright red blood per rectum/small amount on the afternoon of 08/21/2020.  No further episodes reported.  Hemoglobin remains a stable.  Lovenox and Plavix were discontinued.  Will monitor for another day.  Progression Has improved much better.  Physical strength improved as well according to PTs note however patient still requiring 2 assist.  Discussed with son.  He is able to provide 24-hour care but he is alone and will not have a second person.  He is willing to take patient home when she progresses further and requires 1 person.  Also tells me that they have 5 steps to get to the home and he is unable to lift her.  PT to see her.  TOC on board.   Family Communication/Anticipated D/C date and plan/Code Status   DVT prophylaxis: Continue SCD   Current Level of Care:: Telemetry Medical Code Status: Full Code.  Family Communication: None at bedside placement. Disposition Plan:  Status is: Inpatient  Remains inpatient appropriate because:Altered mental status, remains in restraints for safety.   Dispo:  Patient From:  Home  Planned Disposition: [place]  Expected discharge date: 08/21/2020  Medically stable for discharge: Yes    Medical Consultants:    Neurology   Anti-Infectives:    None  Subjective:   Patient seen and examined.  She is fully alert and oriented.  No complaints. Objective:    Vitals:   08/21/20 2023 08/22/20 0015 08/22/20 0323 08/22/20 0739  BP: 125/88 (!) 154/67 (!) 86/51 (!) 152/95  Pulse: 89 77 72 70  Resp: 20 20 20 18   Temp: 98.8 F (37.1 C) 98.9 F (37.2 C) 98.5 F (36.9 C) 98.5 F (36.9 C)  TempSrc: Oral Oral Axillary Oral  SpO2: 99% 97% 100% 92%    Intake/Output Summary (Last 24 hours) at 08/22/2020 1031 Last data filed at 08/22/2020 0830 Gross per 24 hour  Intake 240 ml  Output --  Net 240 ml   There were no vitals filed for this visit.  Exam: General exam: Appears calm and comfortable  Respiratory system: Clear to auscultation. Respiratory effort normal. Cardiovascular system: S1 & S2 heard, RRR. No JVD, murmurs, rubs, gallops or clicks. No pedal edema. Gastrointestinal system: Abdomen is nondistended, soft and nontender. No organomegaly or masses felt. Normal bowel sounds heard. Central nervous system: Alert and oriented. No focal neurological deficits. Extremities: Symmetric 5 x 5 power. Skin: No rashes, lesions or ulcers.  Psychiatry: Judgement and insight appear poor     Data Reviewed:   I have personally reviewed following labs and imaging studies:  Labs: Labs show the following:   Basic Metabolic Panel: Recent Labs  Lab 08/15/20 1754 08/17/20 0301 08/17/20 0301 08/18/20 0706 08/19/20 0141 08/20/20 0413 08/21/20 0225  NA  --  143  --  144 142 144 144  K  --  3.1*   < > 3.8 3.8 3.6 3.6  CL  --  109  --  112* 110 109 110  CO2  --  21*  --  21* 21* 23 24  GLUCOSE  --  128*  --  163* 167* 156* 131*  BUN  --  21  --  20 19 18  24*  CREATININE  --  0.78  --  0.76 0.72 0.77 0.85  CALCIUM  --  9.5  --  9.7 10.0 9.7 9.6   MG 1.8  --   --   --   --   --   --    < > = values in this interval not displayed.   GFR CrCl cannot be calculated (Unknown ideal weight.). Liver Function Tests: No results for input(s): AST, ALT, ALKPHOS, BILITOT, PROT, ALBUMIN in the last 168 hours.  No results for input(s): AMMONIA in the last 168 hours. CBC: Recent Labs  Lab 08/18/20 0706 08/19/20 0141 08/19/20 1330 08/20/20 0413 08/21/20 0225 08/22/20 0946  WBC 8.5 7.8  --  8.5 11.0* 8.0  NEUTROABS  --   --   --   --   --  4.8  HGB 14.0 14.5  --  14.0 13.7 12.9  HCT 41.1 46.3* 42.3 43.9 40.7 39.9  MCV 88.4 93.0  --  91.3 89.6 90.7  PLT 259 228  --  269 272 261   CBG: Recent Labs  Lab 08/21/20 0732 08/21/20 1143 08/21/20 1713 08/21/20 2034 08/22/20 0739  GLUCAP 144* 226* 136* 199* 133*   Sepsis Labs: Recent Labs  Lab  08/19/20 0141 08/20/20 0413 08/21/20 0225 08/22/20 0946  WBC 7.8 8.5 11.0* 8.0    Microbiology Recent Results (from the past 240 hour(s))  MRSA PCR Screening     Status: None   Collection Time: 08/12/20  5:04 PM   Specimen: Nasal Mucosa; Nasopharyngeal  Result Value Ref Range Status   MRSA by PCR NEGATIVE NEGATIVE Final    Comment:        The GeneXpert MRSA Assay (FDA approved for NASAL specimens only), is one component of a comprehensive MRSA colonization surveillance program. It is not intended to diagnose MRSA infection nor to guide or monitor treatment for MRSA infections. Performed at [facility], [place] [address], [place], [place] [address]   Culture, blood (Routine X 2) w Reflex to ID Panel     Status: None   Collection Time: 08/12/20  6:33 PM   Specimen: BLOOD  Result Value Ref Range Status   Specimen Description BLOOD LEFT ANTECUBITAL  Final   Special Requests   Final    BOTTLES DRAWN AEROBIC AND ANAEROBIC Blood Culture adequate volume   Culture   Final    NO GROWTH 5 DAYS Performed at [facility], [place] [address], [place],  [address]    Report  Status 08/17/2020 FINAL  Final    Procedures and diagnostic studies:  No results found.  Medications:   . amLODipine  10 mg Oral Daily  . aspirin EC  81 mg Oral Daily  . diclofenac Sodium  2 g Topical BID  . enalapril  20 mg Oral BID  . fenofibrate  160 mg Oral Daily  . Gerhardt's butt cream   Topical TID  . hydrALAZINE  50 mg Oral Q8H  . insulin aspart  0-9 Units Subcutaneous TID WC  . insulin aspart  4 Units Subcutaneous TID WC  . insulin glargine  18 Units Subcutaneous Daily  . metoprolol tartrate  50 mg Oral BID  . mometasone-formoterol  2 puff Inhalation BID  . pantoprazole  40 mg Oral Daily  . rosuvastatin  40 mg Oral Daily  . sertraline  100 mg Oral QHS  . sodium chloride flush  3 mL Intravenous Q12H  . umeclidinium bromide  1 puff Inhalation Daily   Continuous Infusions:   LOS: 9 days   Darliss Cheney, MD  [facility]   [facility] How to contact the [facility] Attending or Consulting provider [place] or covering provider during after hours [place], for this patient?  1. Check the care team in [facility] and look for a) attending/consulting [facility] provider listed and b) the [facility] team listed 2. Log into [email or web address] and use [place]'s universal password to access. If you do not have the password, please contact the hospital operator. 3. Locate the [facility] provider you are looking for under [facility] and page to a number that you can be directly reached. 4. If you still have difficulty reaching the provider, please page the [facility] (Director on Call) for the Hospitalists listed on amion for assistance.  08/22/2020, 10:31 AM

## 2020-08-22 NOTE — Plan of Care (Signed)
  Problem: Education: Goal: Knowledge of General Education information will improve Description: Including pain rating scale, medication(s)/side effects and non-pharmacologic comfort measures Outcome: Progressing   Problem: Health Behavior/Discharge Planning: Goal: Ability to manage health-related needs will improve Outcome: Progressing   Problem: Clinical Measurements: Goal: Ability to maintain clinical measurements within normal limits will improve Outcome: Progressing Goal: Will remain free from infection Outcome: Progressing   Problem: Activity: Goal: Risk for activity intolerance will decrease Outcome: Progressing   Problem: Coping: Goal: Level of anxiety will decrease Outcome: Progressing   Problem: Elimination: Goal: Will not experience complications related to bowel motility Outcome: Progressing

## 2020-08-23 DIAGNOSIS — I10 Essential (primary) hypertension: Secondary | ICD-10-CM | POA: Diagnosis not present

## 2020-08-23 LAB — GLUCOSE, CAPILLARY
Glucose-Capillary: 144 mg/dL — ABNORMAL HIGH (ref 70–99)
Glucose-Capillary: 184 mg/dL — ABNORMAL HIGH (ref 70–99)
Glucose-Capillary: 100 mg/dL — ABNORMAL HIGH (ref 70–99)
Glucose-Capillary: 180 mg/dL — ABNORMAL HIGH (ref 70–99)

## 2020-08-23 LAB — CBC
HCT: 35.8 % — ABNORMAL LOW (ref 36.0–46.0)
Hemoglobin: 12.3 g/dL (ref 12.0–15.0)
MCH: 30.4 pg (ref 26.0–34.0)
MCHC: 34.4 g/dL (ref 30.0–36.0)
MCV: 88.6 fL (ref 80.0–100.0)
Platelets: 245 10*3/uL (ref 150–400)
RBC: 4.04 MIL/uL (ref 3.87–5.11)
RDW: 12.4 % (ref 11.5–15.5)
WBC: 9.8 10*3/uL (ref 4.0–10.5)
nRBC: 0 % (ref 0.0–0.2)

## 2020-08-23 MED ORDER — INSULIN GLARGINE 100 UNIT/ML ~~LOC~~ SOLN
24.0000 [IU] | Freq: Every day | SUBCUTANEOUS | Status: DC
  Administered 2020-08-23 – 2020-09-01 (×10): 24 [IU] via SUBCUTANEOUS
  Filled 2020-08-23 (×11): qty 0.24

## 2020-08-23 NOTE — Progress Notes (Signed)
Received msg from RN that patient's hand is more swollen and needs a bedside evaluation. Evaluated pt at bedside. She has a little more swelling than this morning but on clinical exam, it still is hematoma which she incurred few days ago. She is able to flex her fingers with normal pulse. Nothing alarming at this time. Asked RN to keep it elevated and apply ice packs.

## 2020-08-23 NOTE — Progress Notes (Signed)
Progress Note    Michelle [name]  PYK:[id number] DOB: 11-16-1932  DOA: 08/12/2020 PCP: [name], MD    Brief Narrative:   Chief complaint: F/U altered mental status.  Medical records reviewed and are as summarized below:  Michelle [name] is an 87 y.o. female with a past medical history of type 2 diabetes, hypertension, hyperlipidemia, CAD, MI who presented to the ED via EMS on 08/12/20 after family became concerned that she was more confused, lethargic, and disoriented than usual baseline.  EMS noted combativeness and administered Versed. Of note: She was last admitted in 07/07/20 with delirium of uncertain etiology despite work-up. If was felt that uncontrolled BP was contributing to her delirium and that she had underlying dementia.EEG done 08/13/20 suggestive of mild diffuse encephalopathy and nonspecific etiology with no seizures or epileptiform discharges.  She was started on Zyprexa as needed for agitation during the day and Risperdal twice daily to reduce overall behavioral issues and restlessness.  Assessment/Plan:   Principle problem: Acute metabolic encephalopathy of unclear etiology, possibly hypertensive versus delirium in the setting of dementia, POA Previously reported as possible exacerbation of underlying dementia and possibly hypertension and worsened by medications (Ativan, Haldol, melatonin).  EEG consistent with diffuse encephalopathy.  CT unrevealing, only showing mild chronic white matter ischemic changes.  No evidence of UTI/infection. Sedating medications have been held, and the patient is not somnolent today, but is confused  She is a full-code. Per son, she was fairly functional prior to admit. She ambulated with a walker. Lives with son at baseline, who does the shopping. She did some cooking, but occasionally burned the food because she forgot she was cooking it.   B12 folate etc. negative for metabolic causes This is probably all underlying dementia.   Patient has progressed since yesterday.  Currently she is alert, oriented, pleasant and answering all the questions.  Type 2 diabetes, with long-term use of insulin, with vascular & neurological complications/hyperglycemia, POA Blood sugar slightly elevated, increase Lantus to 24 units and continue sliding scale and 4 units of meal coverage.   Hypertension, uncontrolled/chronic diastolic CHF, POA Blood pressure very well controlled for most part, continue Lopressor 50 mg twice daily, amlodipine 10 mg enalapril 20 mg twice daily and hydralazine 50 mg Q 8 hours.  Aldosterone/renin ratio normal.  Lactic acidosis, resolved  Hypokalemia, resolved, repeating labs today.  Hyperlipidemia, POA Continue Crestor and fenofibrate.  CAD, POA Continue aspirin and beta-blocker.  Holding Plavix.  Bright red blood per rectum: Reportedly patient had one episode of bright red blood per rectum/small amount on the afternoon of 08/21/2020.  No further episodes reported.  Hemoglobin remains a stable.  Lovenox and Plavix were discontinued.  We will resume Plavix and Lovenox today.  Progression/disposition Has improved much better.  Physical strength improved as well according to PTs note however patient still requiring 2 assist.  Discussed with son yesterday.  He is able to provide 24-hour care but he is alone and will not have a second person.  He is willing to take patient home when she progresses further and requires 1 person however he does not have enough support today so he is requesting earliest discharge tomorrow.  Patient has not been seen by PT yesterday.  I have asked primary RN to reach out to PT to request to reassess patient today.   Family Communication/Anticipated D/C date and plan/Code Status   DVT prophylaxis: Continue SCD, start on Lovenox.   Current Level of Care::  Telemetry Medical Code Status: Full Code.  Family Communication: None at bedside placement.  Discussed with son  yesterday. Disposition Plan: Status is: Inpatient  Remains inpatient appropriate because: Unsafe DC plan   Dispo:  Patient From: Home  Planned Disposition: [place]  Expected discharge date: 08/24/2020  Medically stable for discharge: Yes    Medical Consultants:    Neurology   Anti-Infectives:    None  Subjective:   Patient seen and examined.  She is fully alert and oriented.  She has no complaint.  She tells me that she wants to go to the bathroom and has asked nursing staff about 30 minutes but nobody has shown up yet.  I spoke to the unit secretary personally and asked that someone please go and help patient going to the restroom.  Objective:    Vitals:   08/22/20 2348 08/23/20 0435 08/23/20 0757 08/23/20 0837  BP:  (!) 160/64 (!) 152/56   Pulse: 71 74 77   Resp: 18 20 20    Temp: 98.3 F (36.8 C) 98.6 F (37 C) 98.2 F (36.8 C)   TempSrc: Oral Oral    SpO2: 98% 100%  100%    Intake/Output Summary (Last 24 hours) at 08/23/2020 1117 Last data filed at 08/23/2020 0436 Gross per 24 hour  Intake 480 ml  Output 200 ml  Net 280 ml   There were no vitals filed for this visit.  Exam: General exam: Appears calm and comfortable  Respiratory system: Clear to auscultation. Respiratory effort normal. Cardiovascular system: S1 & S2 heard, RRR. No JVD, murmurs, rubs, gallops or clicks. No pedal edema. Gastrointestinal system: Abdomen is nondistended, soft and nontender. No organomegaly or masses felt. Normal bowel sounds heard. Central nervous system: Alert and oriented. No focal neurological deficits. Extremities: Symmetric 5 x 5 power. Skin: No rashes, lesions or ulcers.  Psychiatry: Judgement and insight appear poor  Data Reviewed:   I have personally reviewed following labs and imaging studies:  Labs: Labs show the following:   Basic Metabolic Panel: Recent Labs  Lab 08/18/20 0706 08/19/20 0141 08/20/20 0413 08/21/20 0225 08/22/20 0946  NA  144 142 144 144 139  K 3.8 3.8 3.6 3.6 3.1*  CL 112* 110 109 110 106  CO2 21* 21* 23 24 23   GLUCOSE 163* 167* 156* 131* 218*  BUN 20 19 18  24* 25*  CREATININE 0.76 0.72 0.77 0.85 0.92  CALCIUM 9.7 10.0 9.7 9.6 9.3  MG  --   --   --   --  1.7   GFR CrCl cannot be calculated (Unknown ideal weight.). Liver Function Tests: No results for input(s): AST, ALT, ALKPHOS, BILITOT, PROT, ALBUMIN in the last 168 hours.  No results for input(s): AMMONIA in the last 168 hours. CBC: Recent Labs  Lab 08/19/20 0141 08/19/20 1330 08/20/20 0413 08/21/20 0225 08/22/20 0946 08/23/20 0258  WBC 7.8  --  8.5 11.0* 8.0 9.8  NEUTROABS  --   --   --   --  4.8  --   HGB 14.5  --  14.0 13.7 12.9 12.3  HCT 46.3* 42.3 43.9 40.7 39.9 35.8*  MCV 93.0  --  91.3 89.6 90.7 88.6  PLT 228  --  269 272 261 245   CBG: Recent Labs  Lab 08/22/20 1124 08/22/20 1420 08/22/20 1648 08/22/20 2013 08/23/20 0754  GLUCAP 151* 187* 218* 205* 144*   Sepsis Labs: Recent Labs  Lab 08/20/20 0413 08/21/20 0225 08/22/20 0946 08/23/20 0258  WBC  8.5 11.0* 8.0 9.8    Microbiology No results found for this or any previous visit (from the past 240 hour(s)).  Procedures and diagnostic studies:  No results found.  Medications:   . amLODipine  10 mg Oral Daily  . aspirin EC  81 mg Oral Daily  . diclofenac Sodium  2 g Topical BID  . enalapril  20 mg Oral BID  . fenofibrate  160 mg Oral Daily  . Gerhardt's butt cream   Topical TID  . hydrALAZINE  50 mg Oral Q8H  . insulin aspart  0-9 Units Subcutaneous TID WC  . insulin aspart  4 Units Subcutaneous TID WC  . insulin glargine  24 Units Subcutaneous Daily  . metoprolol tartrate  50 mg Oral BID  . mometasone-formoterol  2 puff Inhalation BID  . pantoprazole  40 mg Oral Daily  . rosuvastatin  40 mg Oral Daily  . sertraline  100 mg Oral QHS  . sodium chloride flush  3 mL Intravenous Q12H  . umeclidinium bromide  1 puff Inhalation Daily   Continuous  Infusions:   LOS: 10 days   Darliss Cheney, MD  [facility]   [facility] How to contact the [facility] Attending or Consulting provider [place] or covering provider during after hours [place], for this patient?  1. Check the care team in [facility] and look for a) attending/consulting TR[facility] provider listed and b) the [facility] team listed 2. Log into [email or web address] and use [place]'s universal password to access. If you do not have the password, please contact the hospital operator. 3. Locate the [facility] provider you are looking for under [facility] and page to a number that you can be directly reached. 4. If you still have difficulty reaching the provider, please page the [facility] (Director on Call) for the Hospitalists listed on amion for assistance.  08/23/2020, 11:17 AM

## 2020-08-24 DIAGNOSIS — G9341 Metabolic encephalopathy: Principal | ICD-10-CM

## 2020-08-24 LAB — GLUCOSE, CAPILLARY
Glucose-Capillary: 160 mg/dL — ABNORMAL HIGH (ref 70–99)
Glucose-Capillary: 184 mg/dL — ABNORMAL HIGH (ref 70–99)
Glucose-Capillary: 192 mg/dL — ABNORMAL HIGH (ref 70–99)
Glucose-Capillary: 174 mg/dL — ABNORMAL HIGH (ref 70–99)

## 2020-08-24 MED ORDER — HALOPERIDOL LACTATE 5 MG/ML IJ SOLN
5.0000 mg | Freq: Once | INTRAMUSCULAR | Status: AC
  Administered 2020-08-24: 5 mg via INTRAMUSCULAR
  Filled 2020-08-24: qty 1

## 2020-08-24 MED ORDER — ENOXAPARIN SODIUM 40 MG/0.4ML ~~LOC~~ SOLN
40.0000 mg | SUBCUTANEOUS | Status: DC
  Administered 2020-08-24 – 2020-08-31 (×8): 40 mg via SUBCUTANEOUS
  Filled 2020-08-24 (×7): qty 0.4

## 2020-08-24 MED ORDER — CLOPIDOGREL BISULFATE 75 MG PO TABS
75.0000 mg | ORAL_TABLET | Freq: Every day | ORAL | Status: DC
  Administered 2020-08-24 – 2020-09-01 (×9): 75 mg via ORAL
  Filled 2020-08-24 (×9): qty 1

## 2020-08-24 NOTE — Progress Notes (Signed)
Physical Therapy Treatment Patient Details Name: Michelle [name] MRN: [id number] DOB: 1933-05-08 Today's Date: 08/24/2020    History of Present Illness 87 year old female with a past medical history of type 2 diabetes, hypertension, hyperlipidemia, CAD, MI who presented to the ED on 1/26 with complaints of altered mental status    PT Comments    Pt attempting to get out of bed as PT walking by room.  Pt then able to walk to bathroom, back to bed, and complete x2 attempts at navigating a small 4" step. The pt reports significant fatigue and pain following short bout of mobility, but was able to demo significant improvements from prior sessions in decreased assist needed and activity tolerance. The pt continues to be limited by deficits in cognition, safety awareness and recognition of her deficits, therefore requiring 24/7 supervision in addition to physical assist and repeated cueing for all mobility. The pt is motivated to return home, but has 5 steps to enter, and given current deficits in strength and endurance, was unable to complete more than 2 steps at this time.  Continue to recommend short term SNF rehab to facilitate return to safe mobility in the home, but will continue to progress with PT plan of care with goal of returning to level where it is safe for pt to d/c home with her son.    Follow Up Recommendations  SNF;Supervision/Assistance - 24 hour (currently short term rehab, may progress to home with home therapies)     Equipment Recommendations  Rolling walker with 5" wheels;3in1 (PT) (shower seat)    Recommendations for Other Services       Precautions / Restrictions Precautions Precautions: Fall Restrictions Weight Bearing Restrictions: No    Mobility  Bed Mobility Overal bed mobility: Needs Assistance Bed Mobility: Supine to Sit;Sit to Supine     Supine to sit: Supervision Sit to supine: Supervision   General bed mobility comments: pt had come to sitting EOB  without any staff present, able to manage with increased time and effort, but should have supervision for safety  Transfers Overall transfer level: Needs assistance Equipment used: Rolling walker (2 wheeled) Transfers: Sit to/from [facility] Sit to Stand: Min assist Stand pivot transfers: Min assist       General transfer comment: Min A to initiate power up into standing, cues for hand positioning and slowed eccentric lower for safety. Pt requires repeated cues for all aspects with each attempt  Ambulation/Gait Ambulation/Gait assistance: Min assist Gait Distance (Feet): 15 Feet (+15 ft + 83ft) Assistive device: Rolling walker (2 wheeled) Gait Pattern/deviations: Step-to pattern;Decreased stride length;Decreased weight shift to right;Decreased weight shift to left;Trunk flexed;Narrow base of support Gait velocity: decreased Gait velocity interpretation: <1.31 ft/sec, indicative of household ambulator General Gait Details: pt with narrow BOS and significant trunk flexion. requires frequent, repeated verbal and tactile cues on positioning, posture, and positioning in RW to reduce fall risk. Pt agreeable but with little carryover   Stairs Stairs: Yes Stairs assistance: Mod assist Stair Management: Forwards;With walker Number of Stairs: 1 (x2) General stair comments: pt climbed 1 4" step once with LLE leading and once with RLE leading. The pt required significant assist to power up and steady, and reports significant fatigue and pain in B knees after 2 steps       Balance Overall balance assessment: Needs assistance Sitting-balance support: Feet supported;No upper extremity supported Sitting balance-Leahy Scale: Fair     Standing balance support: Bilateral upper extremity supported;During functional activity Standing balance-Leahy  Scale: Poor Standing balance comment: reliant on physical A and UE support                            Cognition  Arousal/Alertness: Awake/alert Behavior During Therapy: Flat affect Overall Cognitive Status: Impaired/Different from baseline Area of Impairment: Attention;Memory;Following commands;Safety/judgement;Awareness;Problem solving                   Current Attention Level: Focused Memory: Decreased recall of precautions;Decreased short-term memory Following Commands: Follows one step commands inconsistently;Follows one step commands with increased time Safety/Judgement: Decreased awareness of safety;Decreased awareness of deficits Awareness: Intellectual Problem Solving: Decreased initiation;Slow processing;Requires verbal cues;Difficulty sequencing General Comments: Pt with improved alertness at this time, attempting to get out of bed on her own despite bed alarm going off when PT walked by room. States she didn't know she wasn't supposed to get up on her own here. The pt was able to identifiy needs during this session, but continues to present with deficits in safety awareness and recognition of her deficits/need for assistance.         General Comments General comments (skin integrity, edema, etc.): VSS      Pertinent Vitals/Pain Pain Assessment: Faces Faces Pain Scale: Hurts even more Pain Location: bilateral knees after walking Pain Descriptors / Indicators: Discomfort;Moaning Pain Intervention(s): Limited activity within patient's tolerance;Repositioned    Home Living Family/patient expects to be discharged to:: Private residence Living Arrangements: Children Available Help at Discharge: Family;Available 24 hours/day Type of Home: House Home Access: Stairs to enter Entrance Stairs-Rails: Right Home Layout: One level Home Equipment: Walker - 4 wheels;Bedside commode;Shower seat;Grab bars - tub/shower      Prior Function Level of Independence: Needs assistance  Gait / Transfers Assistance Needed: patient with supervision and use of rollator,       PT Goals (current  goals can now be found in the care plan section) Acute Rehab PT Goals Patient Stated Goal: to go to rehab and then return home when safe PT Goal Formulation: With family Time For Goal Achievement: 08/30/20 Potential to Achieve Goals: Fair Progress towards PT goals: Progressing toward goals    Frequency    Min 4X/week      PT Plan Current plan remains appropriate       AM-PAC PT "6 Clicks" Mobility   Outcome Measure  Help needed turning from your back to your side while in a flat bed without using bedrails?: A Little Help needed moving from lying on your back to sitting on the side of a flat bed without using bedrails?: A Little Help needed moving to and from a bed to a chair (including a wheelchair)?: A Lot Help needed standing up from a chair using your arms (e.g., wheelchair or bedside chair)?: A Little Help needed to walk in hospital room?: A Lot Help needed climbing 3-5 steps with a railing? : A Lot 6 Click Score: 15    End of Session Equipment Utilized During Treatment: Gait belt Activity Tolerance: Patient limited by fatigue;Patient limited by lethargy Patient left: with call bell/phone within reach;in bed;with bed alarm set;with family/visitor present Nurse Communication: Mobility status PT Visit Diagnosis: Difficulty in walking, not elsewhere classified (R26.2);Muscle weakness (generalized) (M62.81);Other symptoms and signs involving the nervous system [id number])     Time: [id number] PT Time Calculation (min) (ACUTE ONLY): 28 min  Charges:  $Gait Training: 8-22 mins $Therapeutic Activity: 8-22 mins  Karma Ganja, PT, DPT   Acute Rehabilitation Department Pager #: [phone]   Otho Bellows 08/24/2020, 2:47 PM

## 2020-08-24 NOTE — TOC Progression Note (Addendum)
Transition of Care [facility]) - Progression Note    Patient Details  Name: Michelle [name] MRN: [id number] Date of Birth: 1932-09-18  Transition of Care [facility]) CM/SW [place], [place] Phone Number: 08/24/2020, 12:53 PM  Clinical Narrative:     [facility])- unable to make bed offer at this time due to patient's agitation.   CSW will continue to follow and assist with discharge planning. Barrier to placement - patient's behaviors will need to improve before SNFs consider placement-   CSW called informed patient's son,Michelle [name] - [facility] unable to make an offer at this time. He expressed unable to care for patient at home as long as she is requiring 2 assist. CSW informed assistance in the home(caregiver assistance) -will be out of pocket cost.   CSW will continue to follow and assist with discharge planning.  Thurmond Butts, MSW, LCSW Clinical Social Worker     Expected Discharge Plan: Skilled Nursing Facility Barriers to Discharge: Continued Medical Work up  Expected Discharge Plan and Services Expected Discharge Plan: [place] In-house Referral: Clinical Social Work     Living arrangements for the past 2 months: Single Family Home                                       Social Determinants of Health (SDOH) Interventions    Readmission Risk Interventions No flowsheet data found.

## 2020-08-24 NOTE — Progress Notes (Signed)
Physical Therapy Treatment Patient Details Name: Michelle [name] MRN: [id number] DOB: 1933/06/05 Today's Date: 08/24/2020    History of Present Illness 87 year old female with a past medical history of type 2 diabetes, hypertension, hyperlipidemia, CAD, MI who presented to the ED on 1/26 with complaints of altered mental status    PT Comments    The pt was agreeable to PT session due to strong desire to return to bed from recliner this morning. However, progression of mobility was limited by pt fatigue and lethargy through session, she opened her eyes briefly when asked a question directly, but did not maintain alertness for more than 3-5 seconds at a time. The pt was able to complete transfers with decreased assist, but due to significant deficits in safety awareness, awareness of deficits, endurance, and stability, the pt continues to present at a level that would benefit from short stint of SNF rehab prior to return home with assist of son. The pt's son (caretaker) was present for session, asked many good questions about home vs short term SNF rehab d/c options, and stated he feels uncomfortable with d/c home at this time due to pt's deficits compared to mobility prior to admission. Will continue to progress PT plan of care with goal of returning to level where it is safe for pt to d/c home with her son.     Follow Up Recommendations  SNF;Supervision/Assistance - 24 hour (currently short term rehab, may progress to home with home therapies)     Equipment Recommendations  Rolling walker with 5" wheels;3in1 (PT) (shower seat)    Recommendations for Other Services       Precautions / Restrictions Precautions Precautions: Fall Restrictions Weight Bearing Restrictions: No    Mobility  Bed Mobility Overal bed mobility: Needs Assistance Bed Mobility: Sit to Supine       Sit to supine: Min guard   General bed mobility comments: Min Guard A for safety. Pt VERY motivated to return to  bed and sleeping  Transfers Overall transfer level: Needs assistance Equipment used: Rolling walker (2 wheeled) Transfers: Sit to/from [facility] Sit to Stand: Min assist;+2 safety/equipment Stand pivot transfers: Min assist;+2 safety/equipment       General transfer comment: Min A to initiate power up into standing and then maintain balance to pivot to bed  Ambulation/Gait Ambulation/Gait assistance: Min assist;+2 physical assistance Gait Distance (Feet): 5 Feet Assistive device: Rolling walker (2 wheeled) Gait Pattern/deviations: Step-to pattern;Decreased stride length;Decreased weight shift to right;Decreased weight shift to left;Trunk flexed;Narrow base of support Gait velocity: decreased Gait velocity interpretation: <1.31 ft/sec, indicative of household ambulator General Gait Details: pt with narrow BOS with short, forward and lateral steps with heavy relaince on RW. improved clearance of each foot with les cueing than prior session      Balance Overall balance assessment: Needs assistance Sitting-balance support: Feet supported;No upper extremity supported Sitting balance-Leahy Scale: Fair     Standing balance support: Bilateral upper extremity supported;During functional activity Standing balance-Leahy Scale: Poor Standing balance comment: reliant on physical A and UE support                            Cognition Arousal/Alertness: Lethargic Behavior During Therapy: Flat affect Overall Cognitive Status: Impaired/Different from baseline Area of Impairment: Attention;Memory;Following commands;Safety/judgement;Awareness;Problem solving                   Current Attention Level: Focused Memory: Decreased short-term memory Following Commands:  Follows one step commands inconsistently;Follows one step commands with increased time Safety/Judgement: Decreased awareness of safety;Decreased awareness of deficits Awareness:  Intellectual Problem Solving: Decreased initiation;Slow processing;Requires verbal cues;Difficulty sequencing General Comments: Pt resting with eyes closed in recliner upon arrival of PT/OT, unable to maintain eyes open for more than 3-5 second bouts. The pt was able to follow commands regarding stepping and positioning, but requires freqent reminders to maintain      Exercises      General Comments General comments (skin integrity, edema, etc.): VSS, son present through session and discussed d/c options at length. Son voiced preference for short term SNF rehab      Pertinent Vitals/Pain Pain Assessment: Faces Faces Pain Scale: Hurts little more Pain Location: generalized during mobility Pain Descriptors / Indicators: Discomfort;Guarding Pain Intervention(s): Limited activity within patient's tolerance;Monitored during session;Repositioned    Home Living Family/patient expects to be discharged to:: Private residence Living Arrangements: Children Available Help at Discharge: Family;Available 24 hours/day Type of Home: House Home Access: Stairs to enter Entrance Stairs-Rails: Right Home Layout: One level Home Equipment: Walker - 4 wheels;Bedside commode;Shower seat;Grab bars - tub/shower      Prior Function Level of Independence: Needs assistance  Gait / Transfers Assistance Needed: patient with supervision and use of rollator,       PT Goals (current goals can now be found in the care plan section) Acute Rehab PT Goals Patient Stated Goal: Go to rehab and return home PT Goal Formulation: With family Time For Goal Achievement: 08/30/20 Potential to Achieve Goals: Fair Progress towards PT goals: Progressing toward goals    Frequency    Min 4X/week      PT Plan Current plan remains appropriate    Co-evaluation PT/OT/SLP Co-Evaluation/Treatment: Yes Reason for Co-Treatment: For patient/therapist safety;To address functional/ADL transfers (fatigue) PT goals addressed  during session: Mobility/safety with mobility;Balance OT goals addressed during session: ADL's and self-care      AM-PAC PT "6 Clicks" Mobility   Outcome Measure  Help needed turning from your back to your side while in a flat bed without using bedrails?: A Little Help needed moving from lying on your back to sitting on the side of a flat bed without using bedrails?: A Little Help needed moving to and from a bed to a chair (including a wheelchair)?: A Lot Help needed standing up from a chair using your arms (e.g., wheelchair or bedside chair)?: A Little Help needed to walk in hospital room?: A Lot Help needed climbing 3-5 steps with a railing? : A Lot 6 Click Score: 15    End of Session Equipment Utilized During Treatment: Gait belt Activity Tolerance: Patient limited by fatigue;Patient limited by lethargy Patient left: with call bell/phone within reach;in bed;with bed alarm set;with family/visitor present Nurse Communication: Mobility status PT Visit Diagnosis: Difficulty in walking, not elsewhere classified (R26.2);Muscle weakness (generalized) (M62.81);Other symptoms and signs involving the nervous system (R51.884)     Time: 1660-6301 PT Time Calculation (min) (ACUTE ONLY): 23 min  Charges:  $Therapeutic Activity: 8-22 mins                     Karma Ganja, PT, DPT   Acute Rehabilitation Department Pager #: [phone]   Otho Bellows 08/24/2020, 2:29 PM

## 2020-08-24 NOTE — Progress Notes (Signed)
Once dose of 5mg  haldol IM given as ordered. Patient still agitated at this time. This RN try to reassure her. Attempts to give word search puzzle for more distraction. No success.

## 2020-08-24 NOTE — Progress Notes (Signed)
Unable to leave patient room due to patient attempting to get out of bed. Patient keeps stating "I need to go to the living room with randy" "I need to get out of here" Patient is not redirectable. On call provider notified.

## 2020-08-24 NOTE — Progress Notes (Signed)
Patient pulled out IV. Declining for this RN to place another. This RN attempted to place but patient still refused.

## 2020-08-24 NOTE — Progress Notes (Signed)
Patient attempting to get OOB multiple time without staff help. Patient Removed adult diaper. Stated " I dont want this on" This RN stated the reason for it.  Patient proceded to walk to bathroom without walker. This RN was unable to have another staff member at the time. Patient unsteady without walker. Unsteady but patient was successful in walking to bathroom and back to bed.   0430 Patient attempting to get OOB without staff. Patient educated. This RN currently sitting at bedside for patient comfort.   0513 This RN unable to remain at patient bedside. Bed alarm placed on middle setting. Patient educated.

## 2020-08-24 NOTE — Progress Notes (Addendum)
Patient still very agitated and attempting to get OOB to go downstairs. Bed alarms are activated. Bed controls are locked for patient safety.   0642 Patient calm, resting in bed.

## 2020-08-24 NOTE — Evaluation (Signed)
Occupational Therapy Evaluation Patient Details Name: Michelle [name] MRN: [id number] DOB: Oct 25, 1932 Today's Date: 08/24/2020    History of Present Illness 87 year old female with a past medical history of type 2 diabetes, hypertension, hyperlipidemia, CAD, MI who presented to the ED on 1/26 with complaints of altered mental status   Clinical Impression   PTA, pt was living with her son and was performing BADLs and using RW for mobility. Pt currently requiring Mod A for UB ADLs, Mod-Max A for LB ADLs, and Min A for functional transfers with RW. Pt presenting with significant lethargy and "falling asleep" frequently during session. Pt very motivated to return to bed and would follow cues and commands related to this task; otherwise, inconsistently following cues and requiring Max cues and time. Pt would benefit from further acute OT to facilitate safe dc. Due to decreased cognition, activity tolerance, and safety, recommend dc to SNF for further OT to optimize safety, independence with ADLs, and return to PLOF.     Follow Up Recommendations  SNF;Supervision/Assistance - 24 hour ; Pending progress, may be able to dc to home with son and 24/7   Equipment Recommendations  None recommended by OT    Recommendations for Other Services PT consult     Precautions / Restrictions Precautions Precautions: Fall      Mobility Bed Mobility Overal bed mobility: Needs Assistance Bed Mobility: Sit to Supine       Sit to supine: Min guard   General bed mobility comments: Min Guard A for safety. Pt VERY motivated to return to bed and sleeping    Transfers Overall transfer level: Needs assistance Equipment used: Rolling walker (2 wheeled) Transfers: Sit to/from [facility] Sit to Stand: Min assist;+2 safety/equipment Stand pivot transfers: Min assist;+2 safety/equipment       General transfer comment: Min A to initiate power up into standing and then maintain balance to  pivot to bed    Balance Overall balance assessment: Needs assistance Sitting-balance support: Feet supported;No upper extremity supported Sitting balance-Leahy Scale: Fair     Standing balance support: Bilateral upper extremity supported;During functional activity Standing balance-Leahy Scale: Poor Standing balance comment: Rleiant on physical A and UE support                           ADL either performed or assessed with clinical judgement   ADL Overall ADL's : Needs assistance/impaired Eating/Feeding: Set up;Sitting   Grooming: Set up;Supervision/safety;Sitting;Cueing for sequencing   Upper Body Bathing: Moderate assistance;Sitting   Lower Body Bathing: Moderate assistance;Sit to/from stand   Upper Body Dressing : Moderate assistance;Sitting   Lower Body Dressing: Maximal assistance;Sit to/from stand   Toilet Transfer: Minimal assistance;+2 for safety/equipment;Stand-pivot;RW;Cueing for safety;Cueing for sequencing Toilet Transfer Details (indicate cue type and reason): Min A to power up into standing as wel las initiate movement. Min A for maintaining balance in pivot to recliner. poor sequencing and requiring cues         Functional mobility during ADLs: Minimal assistance;Rolling walker;+2 for physical assistance;+2 for safety/equipment (stand pivot) General ADL Comments: Pt with significant fatigue and lethargy. Pt very motivated to participate in transfer back to bed but not following any cues for other tasks or conversation     Vision         Perception     Praxis      Pertinent Vitals/Pain Pain Assessment: Faces Faces Pain Scale: Hurts little more Pain Location: generalized during mobility Pain  Descriptors / Indicators: Discomfort;Guarding Pain Intervention(s): Limited activity within patient's tolerance;Monitored during session;Repositioned     Hand Dominance     Extremity/Trunk Assessment Upper Extremity Assessment Upper Extremity  Assessment: Generalized weakness;Difficult to assess due to impaired cognition (Pt not following commands to participate in testing)   Lower Extremity Assessment Lower Extremity Assessment: Defer to PT evaluation   Cervical / Trunk Assessment Cervical / Trunk Assessment: Kyphotic   Communication     Cognition Arousal/Alertness: Lethargic Behavior During Therapy: Flat affect Overall Cognitive Status: Impaired/Different from baseline Area of Impairment: Attention;Memory;Following commands;Safety/judgement;Awareness;Problem solving                   Current Attention Level: Focused Memory: Decreased short-term memory Following Commands: Follows one step commands inconsistently;Follows one step commands with increased time Safety/Judgement: Decreased awareness of safety;Decreased awareness of deficits Awareness: Intellectual Problem Solving: Decreased initiation;Slow processing;Requires verbal cues;Difficulty sequencing     General Comments  VSS. Son present throughout    Exercises     Shoulder Instructions      Home Living Family/patient expects to be discharged to:: Private residence Living Arrangements: Children Available Help at Discharge: Family;Available 24 hours/day Type of Home: House Home Access: Stairs to enter [employer] of Steps: 4 Entrance Stairs-Rails: Right Home Layout: One level     Bathroom Shower/Tub: Occupational psychologist: Standard     Home Equipment: Environmental consultant - 4 wheels;Bedside commode;Shower seat;Grab bars - tub/shower          Prior Functioning/Environment Level of Independence: Needs assistance  Gait / Transfers Assistance Needed: patient with supervision and use of rollator,              OT Problem List: Decreased strength;Decreased range of motion;Decreased activity tolerance;Impaired balance (sitting and/or standing);Decreased cognition;Decreased safety awareness;Decreased knowledge of use of DME or  AE;Decreased knowledge of precautions      OT Treatment/Interventions: Self-care/ADL training;Therapeutic exercise;Energy conservation;DME and/or AE instruction;Therapeutic activities;Patient/family education    OT Goals(Current goals can be found in the care plan section) Acute Rehab OT Goals Patient Stated Goal: Go to rehab and return home OT Goal Formulation: With patient/family Time For Goal Achievement: 09/07/20 Potential to Achieve Goals: Good  OT Frequency: Min 2X/week   Barriers to D/C:            Co-evaluation PT/OT/SLP Co-Evaluation/Treatment: Yes Reason for Co-Treatment: For patient/therapist safety;To address functional/ADL transfers (fatigue)   OT goals addressed during session: ADL's and self-care      AM-PAC OT "6 Clicks" Daily Activity     Outcome Measure Help from another person eating meals?: A Little Help from another person taking care of personal grooming?: A Little Help from another person toileting, which includes using toliet, bedpan, or urinal?: A Lot Help from another person bathing (including washing, rinsing, drying)?: A Lot Help from another person to put on and taking off regular upper body clothing?: A Lot Help from another person to put on and taking off regular lower body clothing?: A Lot 6 Click Score: 14   End of Session Equipment Utilized During Treatment: Gait belt;Rolling walker Nurse Communication: Mobility status  Activity Tolerance: Patient limited by fatigue Patient left: with call bell/phone within reach;in bed;with bed alarm set;with family/visitor present  OT Visit Diagnosis: Unsteadiness on feet (R26.81);Other abnormalities of gait and mobility (R26.89);Muscle weakness (generalized) (M62.81);Pain Pain - part of body:  (Generalized)                Time: 1478-2956 OT Time Calculation (  min): 16 min Charges:  OT General Charges $OT Visit: 1 Visit OT Evaluation $OT Eval Moderate Complexity: [place],  OTR/L Acute Rehab Pager: [phone] Office: [place] 08/24/2020, 12:25 PM

## 2020-08-24 NOTE — Progress Notes (Signed)
Progress Note    Michelle [name]  [id number] DOB: 10-Jun-1933  DOA: 08/12/2020 PCP: [name], MD    Brief Narrative:   Chief complaint: F/U altered mental status.  Medical records reviewed and are as summarized below:  Michelle [name] is an 87 y.o. female with a past medical history of type 2 diabetes, hypertension, hyperlipidemia, CAD, MI who presented to the ED via EMS on 08/12/20 after family became concerned that she was more confused, lethargic, and disoriented than usual baseline.  EMS noted combativeness and administered Versed. Of note: She was last admitted in 07/07/20 with delirium of uncertain etiology despite work-up. If was felt that uncontrolled BP was contributing to her delirium and that she had underlying dementia.EEG done 08/13/20 suggestive of mild diffuse encephalopathy and nonspecific etiology with no seizures or epileptiform discharges.  She was started on Zyprexa as needed for agitation during the day and Risperdal twice daily to reduce overall behavioral issues and restlessness.  Assessment/Plan:   Principle problem: Acute metabolic encephalopathy of unclear etiology, possibly hypertensive versus delirium in the setting of dementia, POA Previously reported as possible exacerbation of underlying dementia and possibly hypertension and worsened by medications (Ativan, Haldol, melatonin).  EEG consistent with diffuse encephalopathy.  CT unrevealing, only showing mild chronic white matter ischemic changes.  No evidence of UTI/infection. Sedating medications have been held, and the patient is not somnolent today, but is confused  She is a full-code. Per son, she was fairly functional prior to admit. She ambulated with a walker. Lives with son at baseline, who does the shopping. She did some cooking, but occasionally burned the food because she forgot she was cooking it.   B12 folate etc. negative for metabolic causes This is probably all underlying dementia.   Patient has remained alert and oriented since last 3 days with me however overnight events noted when she was agitated again and required Haldol.  Despite of this, this morning again, she was alert and oriented.  Type 2 diabetes, with long-term use of insulin, with vascular & neurological complications/hyperglycemia, POA Blood sugar fairly controlled.  Continue Lantus 24 units and continue sliding scale and 4 units of meal coverage.   Hypertension, uncontrolled/chronic diastolic CHF, POA Blood pressure very well controlled for most part, continue Lopressor 50 mg twice daily, amlodipine 10 mg enalapril 20 mg twice daily and hydralazine 50 mg Q 8 hours.  Aldosterone/renin ratio normal.  Lactic acidosis, resolved  Hypokalemia, resolved, repeating labs today.  Hyperlipidemia, POA Continue Crestor and fenofibrate.  CAD, POA Continue aspirin and beta-blocker.  Holding Plavix.  Bright red blood per rectum: Reportedly patient had one episode of bright red blood per rectum/small amount on the afternoon of 08/21/2020.  No further episodes reported.  Hemoglobin remains a stable.  Lovenox and Plavix were discontinued.  We will resume Plavix and Lovenox today.  Progression/disposition Has improved much better.  Physical strength improved as well according to PTs note however patient still requiring 2 assist.  Discussed with son yesterday.  He is able to provide 24-hour care but he is alone and will not have a second person.  He is willing to take patient home when she progresses further and requires 1 person however he does not have enough support today so he is requesting earliest discharge tomorrow.  At my request, patient was reassessed by PT and she is still requiring to assist and son is not capable of taking care of her home.  Per TOC, there  is no bed offer for her yet.  We will need to keep her in the hospital so she can be reassessed by PT tomorrow so she can either progress enough to go home with  home health and 1 assist or to SNF.   Family Communication/Anticipated D/C date and plan/Code Status   DVT prophylaxis: Continue SCD, start on Lovenox.   Current Level of Care:: Telemetry Medical Code Status: Full Code.  Family Communication: None at bedside placement.  Discussed with son yesterday. Disposition Plan: Status is: Inpatient  Remains inpatient appropriate because: Unsafe DC plan   Dispo:  Patient From: Home  Planned Disposition: [place]  Expected discharge date: 08/25/2020  Medically stable for discharge: Yes    Medical Consultants:    Neurology   Anti-Infectives:    None  Subjective:   Patient seen and examined.  She was fully alert and oriented.  She had no complaint.  I reassessed her right hand again today.  There is no change from yesterday.  Now it seems to be more like a contusion rather than hematoma.  Nurses are advised to keep right hand elevated and apply cold packs.  Objective:    Vitals:   08/23/20 2005 08/24/20 0447 08/24/20 0700 08/24/20 1343  BP: (!) 148/55  140/60 (!) 130/52  Pulse: 80     Resp:   20   Temp: 97.9 F (36.6 C)  97.8 F (36.6 C)   TempSrc: Oral  Oral   SpO2: 96%     Weight:  77.7 kg      Intake/Output Summary (Last 24 hours) at 08/24/2020 1351 Last data filed at 08/24/2020 0700 Gross per 24 hour  Intake 340 ml  Output 950 ml  Net -610 ml   Filed Weights   08/24/20 0447  Weight: 77.7 kg    Exam:  General exam: Appears calm and comfortable  Respiratory system: Clear to auscultation. Respiratory effort normal. Cardiovascular system: S1 & S2 heard, RRR. No JVD, murmurs, rubs, gallops or clicks. No pedal edema. Gastrointestinal system: Abdomen is nondistended, soft and nontender. No organomegaly or masses felt. Normal bowel sounds heard. Central nervous system: Alert and oriented. No focal neurological deficits. Extremities: Symmetric 5 x 5 power. Skin: She has multiple bruises in bilateral  upper extremities, large contusion in right hand.  Able to flex all fingers with robust pulse. Psychiatry: Judgement and insight appear poor  Data Reviewed:   I have personally reviewed following labs and imaging studies:  Labs: Labs show the following:   Basic Metabolic Panel: Recent Labs  Lab 08/18/20 0706 08/19/20 0141 08/20/20 0413 08/21/20 0225 08/22/20 0946  NA 144 142 144 144 139  K 3.8 3.8 3.6 3.6 3.1*  CL 112* 110 109 110 106  CO2 21* 21* 23 24 23   GLUCOSE 163* 167* 156* 131* 218*  BUN 20 19 18  24* 25*  CREATININE 0.76 0.72 0.77 0.85 0.92  CALCIUM 9.7 10.0 9.7 9.6 9.3  MG  --   --   --   --  1.7   GFR Estimated Creatinine Clearance: 46.1 mL/min (by C-G formula based on SCr of 0.92 mg/dL). Liver Function Tests: No results for input(s): AST, ALT, ALKPHOS, BILITOT, PROT, ALBUMIN in the last 168 hours.  No results for input(s): AMMONIA in the last 168 hours. CBC: Recent Labs  Lab 08/19/20 0141 08/19/20 1330 08/20/20 0413 08/21/20 0225 08/22/20 0946 08/23/20 0258  WBC 7.8  --  8.5 11.0* 8.0 9.8  NEUTROABS  --   --   --   --  4.8  --   HGB 14.5  --  14.0 13.7 12.9 12.3  HCT 46.3* 42.3 43.9 40.7 39.9 35.8*  MCV 93.0  --  91.3 89.6 90.7 88.6  PLT 228  --  269 272 261 245   CBG: Recent Labs  Lab 08/23/20 1243 08/23/20 1707 08/23/20 2120 08/24/20 0659 08/24/20 1203  GLUCAP 184* 100* 180* 160* 184*   Sepsis Labs: Recent Labs  Lab 08/20/20 0413 08/21/20 0225 08/22/20 0946 08/23/20 0258  WBC 8.5 11.0* 8.0 9.8    Microbiology No results found for this or any previous visit (from the past 240 hour(s)).  Procedures and diagnostic studies:  No results found.  Medications:   . amLODipine  10 mg Oral Daily  . aspirin EC  81 mg Oral Daily  . diclofenac Sodium  2 g Topical BID  . enalapril  20 mg Oral BID  . fenofibrate  160 mg Oral Daily  . Gerhardt's butt cream   Topical TID  . hydrALAZINE  50 mg Oral Q8H  . insulin aspart  0-9 Units  Subcutaneous TID WC  . insulin aspart  4 Units Subcutaneous TID WC  . insulin glargine  24 Units Subcutaneous Daily  . metoprolol tartrate  50 mg Oral BID  . mometasone-formoterol  2 puff Inhalation BID  . pantoprazole  40 mg Oral Daily  . rosuvastatin  40 mg Oral Daily  . sertraline  100 mg Oral QHS  . sodium chloride flush  3 mL Intravenous Q12H  . umeclidinium bromide  1 puff Inhalation Daily   Continuous Infusions:   LOS: 11 days   Darliss Cheney, MD  [facility]   [facility] How to contact the [facility] Attending or Consulting provider [place] or covering provider during after hours [place], for this patient?  1. Check the care team in [facility] and look for a) attending/consulting TRH provider listed and b) the [facility] team listed 2. Log into [email or web address] and use [place]'s universal password to access. If you do not have the password, please contact the hospital operator. 3. Locate the [facility] provider you are looking for under [facility] and page to a number that you can be directly reached. 4. If you still have difficulty reaching the provider, please page the [facility] (Director on Call) for the Hospitalists listed on amion for assistance.  08/24/2020, 1:51 PM

## 2020-08-25 DIAGNOSIS — G9341 Metabolic encephalopathy: Secondary | ICD-10-CM | POA: Diagnosis not present

## 2020-08-25 LAB — GLUCOSE, CAPILLARY
Glucose-Capillary: 145 mg/dL — ABNORMAL HIGH (ref 70–99)
Glucose-Capillary: 151 mg/dL — ABNORMAL HIGH (ref 70–99)
Glucose-Capillary: 168 mg/dL — ABNORMAL HIGH (ref 70–99)
Glucose-Capillary: 187 mg/dL — ABNORMAL HIGH (ref 70–99)

## 2020-08-25 NOTE — Progress Notes (Signed)
Physical Therapy Treatment Patient Details Name: Michelle [name] MRN: [id number] DOB: [birth year] Today's Date: 08/25/2020    History of Present Illness 87 year old female with a past medical history of type 2 diabetes, hypertension, hyperlipidemia, CAD, MI who presented to the ED on 1/26 with complaints of altered mental status    PT Comments    Pt eager to get OOB with therapy today, reports "I want to go home!". Pt ambulatory room distance x2 with RW and light assist to steady, overall pt requiring min guard to min assist for mobility. Pt expresses insight into deficits today, and states if she goes home her son will provide light physical assist as needed. PT feels pt is at HHPT level, updated plan to reflect this. PT to practice steps with pt tomorrow prior to d/c to ensure safe entry into home. Will continue to follow.    Follow Up Recommendations  Supervision/Assistance - 24 hour;Home health PT     Equipment Recommendations  Rolling walker with 5" wheels;3in1 (PT) (shower seat)    Recommendations for Other Services       Precautions / Restrictions Precautions Precautions: Fall Restrictions Weight Bearing Restrictions: No    Mobility  Bed Mobility Overal bed mobility: Needs Assistance Bed Mobility: Supine to Sit     Supine to sit: Supervision     General bed mobility comments: supervision for safety, increased time with HOB elevated  Transfers Overall transfer level: Needs assistance Equipment used: Rolling walker (2 wheeled) Transfers: Sit to/from Stand Sit to Stand: Min assist;Min guard         General transfer comment: min guard for safety upon initial stand, min assist for initial power up in subsequent stands. STS x5 total, cues for hand placement when rising/sitting  Ambulation/Gait Ambulation/Gait assistance: Min assist Gait Distance (Feet): 20 Feet (x2) Assistive device: Rolling walker (2 wheeled) Gait Pattern/deviations: Decreased stride  length;Trunk flexed;Narrow base of support;Step-through pattern Gait velocity: decr   General Gait Details: Min assist for steadying, intermittent knee weakness mostly corrected with RW use   Stairs             Wheelchair Mobility    Modified Rankin (Stroke Patients Only)       Balance Overall balance assessment: Needs assistance Sitting-balance support: Feet supported;No upper extremity supported Sitting balance-Leahy Scale: Fair     Standing balance support: Bilateral upper extremity supported;During functional activity Standing balance-Leahy Scale: Poor Standing balance comment: reliant on external support                            Cognition Arousal/Alertness: Awake/alert Behavior During Therapy: Flat affect Overall Cognitive Status: Impaired/Different from baseline Area of Impairment: Attention;Memory;Following commands;Safety/judgement;Problem solving                   Current Attention Level: Selective   Following Commands: Follows one step commands with increased time Safety/Judgement: Decreased awareness of safety;Decreased awareness of deficits   Problem Solving: Decreased initiation;Slow processing;Requires verbal cues;Difficulty sequencing General Comments: A&O x4, follows all commands well. Pt reports understanding that she is unsteady and requires assist once home, demonstrating improved awareness.      Exercises Other Exercises Other Exercises: STS x5 throughout session, for LE strengthening and technique practice (hand placement, slow sit)    General Comments        Pertinent Vitals/Pain Pain Assessment: Faces Faces Pain Scale: No hurt Pain Intervention(s): Limited activity within patient's tolerance;Monitored during session  Home Living                      Prior Function            PT Goals (current goals can now be found in the care plan section) Acute Rehab PT Goals Patient Stated Goal: to go to  rehab and then return home when safe PT Goal Formulation: With family Time For Goal Achievement: 08/30/20 Potential to Achieve Goals: Fair Progress towards PT goals: Progressing toward goals    Frequency    Min 4X/week      PT Plan Discharge plan needs to be updated    Co-evaluation              AM-PAC PT "6 Clicks" Mobility   Outcome Measure  Help needed turning from your back to your side while in a flat bed without using bedrails?: A Little Help needed moving from lying on your back to sitting on the side of a flat bed without using bedrails?: A Little Help needed moving to and from a bed to a chair (including a wheelchair)?: A Little Help needed standing up from a chair using your arms (e.g., wheelchair or bedside chair)?: A Little Help needed to walk in hospital room?: A Little Help needed climbing 3-5 steps with a railing? : A Lot 6 Click Score: 17    End of Session Equipment Utilized During Treatment: Gait belt Activity Tolerance: Patient limited by fatigue;Patient limited by lethargy Patient left: with call bell/phone within reach;in bed;with bed alarm set;with family/visitor present Nurse Communication: Mobility status PT Visit Diagnosis: Difficulty in walking, not elsewhere classified (R26.2);Muscle weakness (generalized) (M62.81);Other symptoms and signs involving the nervous system (R29.898)     Time: 0923-3007 PT Time Calculation (min) (ACUTE ONLY): 19 min  Charges:  $Gait Training: 8-22 mins                     Stacie Glaze, PT Acute Rehabilitation Services Pager [phone]  Office [phone]    [place] 08/25/2020, 2:20 PM

## 2020-08-25 NOTE — Progress Notes (Signed)
Progress Note    Michelle [name]  IRJ:[id number] DOB: [birth year]  DOA: 08/12/2020 PCP: [name], MD    Brief Narrative:   Chief complaint: F/U altered mental status.  Medical records reviewed and are as summarized below:  Michelle [name] is an 87 y.o. female with a past medical history of type 2 diabetes, hypertension, hyperlipidemia, CAD, MI who presented to the ED via EMS on 08/12/20 after family became concerned that she was more confused, lethargic, and disoriented than usual baseline.  EMS noted combativeness and administered Versed. Of note: She was last admitted in 07/07/20 with delirium of uncertain etiology despite work-up. If was felt that uncontrolled BP was contributing to her delirium and that she had underlying dementia.EEG done 08/13/20 suggestive of mild diffuse encephalopathy and nonspecific etiology with no seizures or epileptiform discharges.  She was started on Zyprexa as needed for agitation during the day and Risperdal twice daily to reduce overall behavioral issues and restlessness however both of them have been discontinued since.  Patient has been alert and oriented during the daytime when I evaluated her for the past 3 to 4 days however she has been having intermittent confusion at night.  Still requiring 2 assist.  Son cannot provide care that she requires.  Working on finding placement at [facility].  Assessment/Plan:   Principle problem: Acute metabolic encephalopathy of unclear etiology, possibly hypertensive versus delirium in the setting of dementia, POA Previously reported as possible exacerbation of underlying dementia and possibly hypertension and worsened by medications (Ativan, Haldol, melatonin).  EEG consistent with diffuse encephalopathy.  CT unrevealing, only showing mild chronic white matter ischemic changes.  No evidence of UTI/infection. Sedating medications have been held, and the patient is not somnolent today, but is confused  She is a  full-code. Per son, she was fairly functional prior to admit. She ambulated with a walker. Lives with son at baseline, who does the shopping. She did some cooking, but occasionally burned the food because she forgot she was cooking it.   B12 folate etc. negative for metabolic causes This is probably all underlying dementia.  Patient has remained alert and oriented since last 4 days with me however she has been having intermittent confusion at night.  Type 2 diabetes, with long-term use of insulin, with vascular & neurological complications/hyperglycemia, POA Blood sugar fairly controlled.  Continue Lantus 24 units and continue sliding scale and 4 units of meal coverage.   Hypertension, uncontrolled/chronic diastolic CHF, POA Blood pressure very well controlled for most part, continue Lopressor 50 mg twice daily, amlodipine 10 mg enalapril 20 mg twice daily and hydralazine 50 mg Q 8 hours.  Aldosterone/renin ratio normal.  Lactic acidosis, resolved  Hypokalemia, resolved, repeating labs today.  Hyperlipidemia, POA Continue Crestor and fenofibrate.  CAD, POA Continue aspirin and beta-blocker.  Holding Plavix.  Bright red blood per rectum: Reportedly patient had one episode of bright red blood per rectum/small amount on the afternoon of 08/21/2020.  No further episodes reported.  Hemoglobin remains a stable.  Lovenox and Plavix were discontinued.  And then resumed on 08/24/2020.  Progression/disposition Has improved much better.  Physical strength improved as well according to PTs note however patient still requiring 2 assist.  Discussed with son.  He is able to provide 24-hour care but he is alone and will not have a second person.  He is willing to take patient home when she progresses further and requires 1 person however patient was reassessed by PT  yesterday again and they still recommend to assist or SNF.  TOC on board for finding placement.   Family Communication/Anticipated D/C date  and plan/Code Status   DVT prophylaxis: Continue SCD, Lovenox   Current Level of Care:: Telemetry Medical Code Status: Full Code.  Family Communication: None at bedside placement.  Discussed with son day before yesterday Disposition Plan: Status is: Inpatient  Remains inpatient appropriate because: Unsafe DC plan   Dispo:  Patient From: Home  Planned Disposition: [place]  Expected discharge date: 08/26/2020  Medically stable for discharge: Yes    Medical Consultants:    Neurology   Anti-Infectives:    None  Subjective:   Patient seen and examined.  Patient yet again is alert and oriented and pleasant during my evaluation with no complaints.  Wants to go home.  Objective:    Vitals:   08/24/20 2014 08/25/20 0017 08/25/20 0345 08/25/20 0843  BP: (!) 151/73 (!) 134/112 (!) 126/45 (!) 120/57  Pulse: 81 72 64 90  Resp: 20   20  Temp: 98.8 F (37.1 C) (!) 97.5 F (36.4 C) 98.1 F (36.7 C) 98.5 F (36.9 C)  TempSrc: Oral     SpO2: 99%  95% 98%  Weight:        Intake/Output Summary (Last 24 hours) at 08/25/2020 1405 Last data filed at 08/24/2020 1806 Gross per 24 hour  Intake 360 ml  Output 150 ml  Net 210 ml   Filed Weights   08/24/20 0447  Weight: 77.7 kg    Exam:  General exam: Appears calm and comfortable  Respiratory system: Clear to auscultation. Respiratory effort normal. Cardiovascular system: S1 & S2 heard, RRR. No JVD, murmurs, rubs, gallops or clicks. No pedal edema. Gastrointestinal system: Abdomen is nondistended, soft and nontender. No organomegaly or masses felt. Normal bowel sounds heard. Central nervous system: Alert and oriented. No focal neurological deficits. Extremities: Symmetric 5 x 5 power. Skin: No rashes, lesions or ulcers.  Psychiatry: Judgement and insight appear poor Data Reviewed:   I have personally reviewed following labs and imaging studies:  Labs: Labs show the following:   Basic Metabolic  Panel: Recent Labs  Lab 08/19/20 0141 08/20/20 0413 08/21/20 0225 08/22/20 0946  NA 142 144 144 139  K 3.8 3.6 3.6 3.1*  CL 110 109 110 106  CO2 21* 23 24 23   GLUCOSE 167* 156* 131* 218*  BUN 19 18 24* 25*  CREATININE 0.72 0.77 0.85 0.92  CALCIUM 10.0 9.7 9.6 9.3  MG  --   --   --  1.7   GFR Estimated Creatinine Clearance: 46.1 mL/min (by C-G formula based on SCr of 0.92 mg/dL). Liver Function Tests: No results for input(s): AST, ALT, ALKPHOS, BILITOT, PROT, ALBUMIN in the last 168 hours.  No results for input(s): AMMONIA in the last 168 hours. CBC: Recent Labs  Lab 08/19/20 0141 08/19/20 1330 08/20/20 0413 08/21/20 0225 08/22/20 0946 08/23/20 0258  WBC 7.8  --  8.5 11.0* 8.0 9.8  NEUTROABS  --   --   --   --  4.8  --   HGB 14.5  --  14.0 13.7 12.9 12.3  HCT 46.3* 42.3 43.9 40.7 39.9 35.8*  MCV 93.0  --  91.3 89.6 90.7 88.6  PLT 228  --  269 272 261 245   CBG: Recent Labs  Lab 08/24/20 1203 08/24/20 1533 08/24/20 2013 08/25/20 0656 08/25/20 1137  GLUCAP 184* 192* 174* 145* 151*   Sepsis Labs: Recent  Labs  Lab 08/20/20 0413 08/21/20 0225 08/22/20 0946 08/23/20 0258  WBC 8.5 11.0* 8.0 9.8    Microbiology No results found for this or any previous visit (from the past 240 hour(s)).  Procedures and diagnostic studies:  No results found.  Medications:   . amLODipine  10 mg Oral Daily  . aspirin EC  81 mg Oral Daily  . clopidogrel  75 mg Oral Daily  . diclofenac Sodium  2 g Topical BID  . enalapril  20 mg Oral BID  . enoxaparin (LOVENOX) injection  40 mg Subcutaneous Q24H  . fenofibrate  160 mg Oral Daily  . Gerhardt's butt cream   Topical TID  . hydrALAZINE  50 mg Oral Q8H  . insulin aspart  0-9 Units Subcutaneous TID WC  . insulin aspart  4 Units Subcutaneous TID WC  . insulin glargine  24 Units Subcutaneous Daily  . metoprolol tartrate  50 mg Oral BID  . mometasone-formoterol  2 puff Inhalation BID  . pantoprazole  40 mg Oral Daily  .  rosuvastatin  40 mg Oral Daily  . sertraline  100 mg Oral QHS  . sodium chloride flush  3 mL Intravenous Q12H  . umeclidinium bromide  1 puff Inhalation Daily   Continuous Infusions:   LOS: 12 days   Darliss Cheney, MD  [facility]   [facility] How to contact the [facility] Attending or Consulting provider [place] or covering provider during after hours [place], for this patient?  1. Check the care team in [facility] and look for a) attending/consulting TRH provider listed and b) the [facility] team listed 2. Log into [email or web address] and use [place]'s universal password to access. If you do not have the password, please contact the hospital operator. 3. Locate the [facility] provider you are looking for under [facility] and page to a number that you can be directly reached. 4. If you still have difficulty reaching the provider, please page the [facility] (Director on Call) for the Hospitalists listed on amion for assistance.  08/25/2020, 2:05 PM

## 2020-08-25 NOTE — Progress Notes (Signed)
Patient  getting OOB without staff in the room multiple times. This RN has been assisting her to bathroom x1 with walker.    Patient still unsteady does not listen to RN when being educated on the safety issue with getting up alone and does not listen when educated on importance of two people, patient proceeds to walk.

## 2020-08-25 NOTE — TOC Progression Note (Signed)
Transition of Care [facility]) - Progression Note    Patient Details  Name: Michelle [name] MRN: [id number] Date of Birth: 1933-06-04  Transition of Care [facility]) CM/SW [place], [place] Phone Number: 08/25/2020, 4:18 PM  Clinical Narrative:     CSW spoke with patient's son,David- updated on SNF placement and explained current placement barriers.   CSW will continue to follow and assist with discharge planning.   Thurmond Butts, MSW, LCSW Clinical Social Worker   Expected Discharge Plan: Skilled Nursing Facility Barriers to Discharge: Continued Medical Work up  Expected Discharge Plan and Services Expected Discharge Plan: [place] In-house Referral: Clinical Social Work     Living arrangements for the past 2 months: Single Family Home                                       Social Determinants of Health (SDOH) Interventions    Readmission Risk Interventions No flowsheet data found.

## 2020-08-26 DIAGNOSIS — Z9181 History of falling: Secondary | ICD-10-CM

## 2020-08-26 DIAGNOSIS — R5381 Other malaise: Secondary | ICD-10-CM

## 2020-08-26 DIAGNOSIS — I2510 Atherosclerotic heart disease of native coronary artery without angina pectoris: Secondary | ICD-10-CM

## 2020-08-26 LAB — GLUCOSE, CAPILLARY
Glucose-Capillary: 145 mg/dL — ABNORMAL HIGH (ref 70–99)
Glucose-Capillary: 206 mg/dL — ABNORMAL HIGH (ref 70–99)
Glucose-Capillary: 152 mg/dL — ABNORMAL HIGH (ref 70–99)
Glucose-Capillary: 155 mg/dL — ABNORMAL HIGH (ref 70–99)

## 2020-08-26 LAB — RENAL FUNCTION PANEL
Albumin: 3.3 g/dL — ABNORMAL LOW (ref 3.5–5.0)
Anion gap: 9 (ref 5–15)
BUN: 15 mg/dL (ref 8–23)
CO2: 25 mmol/L (ref 22–32)
Calcium: 9.4 mg/dL (ref 8.9–10.3)
Chloride: 106 mmol/L (ref 98–111)
Creatinine, Ser: 0.72 mg/dL (ref 0.44–1.00)
GFR, Estimated: >60 mL/min (ref >60)
Glucose, Bld: 165 mg/dL — ABNORMAL HIGH (ref 70–99)
Phosphorus: 3 mg/dL (ref 2.5–4.6)
Potassium: 3.5 mmol/L (ref 3.5–5.1)
Sodium: 140 mmol/L (ref 135–145)

## 2020-08-26 LAB — MAGNESIUM: Magnesium: 1.8 mg/dL (ref 1.7–2.4)

## 2020-08-26 LAB — HEMOGLOBIN A1C
Hgb A1c MFr Bld: 6.5 % — ABNORMAL HIGH (ref 4.8–5.6)
Mean Plasma Glucose: 139.85 mg/dL

## 2020-08-26 MED ORDER — ACETAMINOPHEN 500 MG PO TABS
1000.0000 mg | ORAL_TABLET | Freq: Three times a day (TID) | ORAL | Status: DC
  Administered 2020-08-26 – 2020-09-01 (×17): 1000 mg via ORAL
  Filled 2020-08-26 (×16): qty 2

## 2020-08-26 MED ORDER — HALOPERIDOL LACTATE 5 MG/ML IJ SOLN
5.0000 mg | Freq: Once | INTRAMUSCULAR | Status: AC
  Administered 2020-08-27: 5 mg via INTRAVENOUS
  Filled 2020-08-26: qty 1

## 2020-08-26 MED ORDER — INSULIN ASPART 100 UNIT/ML ~~LOC~~ SOLN: 0.0000 [IU] | Freq: Every day | SUBCUTANEOUS | Status: DC

## 2020-08-26 MED ORDER — VITAMIN B-12 1000 MCG PO TABS
1000.0000 ug | ORAL_TABLET | Freq: Every day | ORAL | Status: DC
  Administered 2020-08-26 – 2020-09-01 (×7): 1000 ug via ORAL
  Filled 2020-08-26 (×7): qty 1

## 2020-08-26 MED ORDER — INSULIN ASPART 100 UNIT/ML ~~LOC~~ SOLN
0.0000 [IU] | Freq: Three times a day (TID) | SUBCUTANEOUS | Status: DC
  Administered 2020-08-26: 3 [IU] via SUBCUTANEOUS
  Administered 2020-08-27: 2 [IU] via SUBCUTANEOUS
  Administered 2020-08-27: 5 [IU] via SUBCUTANEOUS
  Administered 2020-08-28: 3 [IU] via SUBCUTANEOUS
  Administered 2020-08-28 – 2020-08-29 (×3): 2 [IU] via SUBCUTANEOUS
  Administered 2020-08-29: 5 [IU] via SUBCUTANEOUS
  Administered 2020-08-30: 2 [IU] via SUBCUTANEOUS
  Administered 2020-08-30: 3 [IU] via SUBCUTANEOUS
  Administered 2020-08-30: 2 [IU] via SUBCUTANEOUS
  Administered 2020-08-31: 3 [IU] via SUBCUTANEOUS

## 2020-08-26 MED ORDER — TRAMADOL HCL 50 MG PO TABS: 50.0000 mg | ORAL_TABLET | Freq: Two times a day (BID) | ORAL | Status: DC | PRN
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
## 2020-08-26 NOTE — Progress Notes (Signed)
Physical Therapy Treatment Patient Details Name: Michelle [name] MRN: [id number] DOB: 31-Oct-1932 Today's Date: 08/26/2020    History of Present Illness 87 year old female with a past medical history of type 2 diabetes, hypertension, hyperlipidemia, CAD, MI who presented to the ED on 1/26 with complaints of altered mental status    PT Comments    Pt tolerates treatment well but continues to fatigue quickly with mobility. Pt requires cues for walker management and continues to require physical assistance for stair negotiation and for transfers when fatigued. Pt remains impulsive, this PT has observed the patient attempting to ambulate without nursing assistance twice on this date prior to PT session. Son reports concern over the possibility of discharging home as he reports difficulty ascending stairs, and also reports that his mother does not listen to him when he attempts to assist her. PT updates recommendations to SNF placement at this time as the pt continues to remain at an increased falls risk and has limited awareness of her deficits. If the patient and family elect to discharge home then the patient will benefit from [place], a RW and 3in1 commode, and 24/7 supervision due to impulsivity.  Follow Up Recommendations  SNF;Supervision/Assistance - 24 hour (HHPT with 24/7 supervision if pt and family decide to D/C home)     Equipment Recommendations  Rolling walker with 5" wheels;3in1 (PT)    Recommendations for Other Services       Precautions / Restrictions Precautions Precautions: Fall Restrictions Weight Bearing Restrictions: No    Mobility  Bed Mobility                  Transfers Overall transfer level: Needs assistance Equipment used: Rolling walker (2 wheeled) Transfers: Sit to/from Stand Sit to Stand: Min guard;Min assist         General transfer comment: PT provides cues for hand placement, with fatigue pt requires minA due to LE  weakness  Ambulation/Gait Ambulation/Gait assistance: Min assist Gait Distance (Feet): 35 Feet (additional 25' in 2nd trial) Assistive device: Rolling walker (2 wheeled) Gait Pattern/deviations: Step-to pattern Gait velocity: decr Gait velocity interpretation: <1.31 ft/sec, indicative of household ambulator General Gait Details: pt with slowed step-to gait, increased trunk flexion, verbal cues required to maintain the RW close to base of support   Stairs Stairs: Yes Stairs assistance: Mod assist Stair Management: One rail Right;Forwards Number of Stairs: 1 General stair comments: 1 step x2 trials, 6" step   Wheelchair Mobility    Modified Rankin (Stroke Patients Only)       Balance Overall balance assessment: Needs assistance Sitting-balance support: No upper extremity supported;Feet supported Sitting balance-Leahy Scale: Fair     Standing balance support: Bilateral upper extremity supported Standing balance-Leahy Scale: Poor Standing balance comment: reliant on UE support of RW or railing                            Cognition Arousal/Alertness: Awake/alert Behavior During Therapy: WFL for tasks assessed/performed Overall Cognitive Status: Impaired/Different from baseline Area of Impairment: Memory;Safety/judgement;Awareness;Problem solving                     Memory: Decreased short-term memory Following Commands: Follows one step commands consistently Safety/Judgement: Decreased awareness of safety;Decreased awareness of deficits Awareness: Emergent Problem Solving: Difficulty sequencing;Requires tactile cues;Requires verbal cues        Exercises General Exercises - Lower Extremity Straight Leg Raises: AROM;Both;5 reps;Seated Other Exercises Other Exercises: PT provides  education on bridge exercise, to be performed in bed    General Comments General comments (skin integrity, edema, etc.): VSS on RA      Pertinent Vitals/Pain Pain  Assessment: Faces Faces Pain Scale: Hurts little more Pain Location: knees Pain Descriptors / Indicators: Aching Pain Intervention(s): Monitored during session    Home Living                      Prior Function            PT Goals (current goals can now be found in the care plan section) Acute Rehab PT Goals Patient Stated Goal: to go to rehab and then return home when safe Progress towards PT goals: Progressing toward goals    Frequency    Min 4X/week      PT Plan Discharge plan needs to be updated    Co-evaluation              AM-PAC PT "6 Clicks" Mobility   Outcome Measure  Help needed turning from your back to your side while in a flat bed without using bedrails?: A Little Help needed moving from lying on your back to sitting on the side of a flat bed without using bedrails?: A Little Help needed moving to and from a bed to a chair (including a wheelchair)?: A Little Help needed standing up from a chair using your arms (e.g., wheelchair or bedside chair)?: A Little Help needed to walk in hospital room?: A Little Help needed climbing 3-5 steps with a railing? : A Lot 6 Click Score: 17    End of Session Equipment Utilized During Treatment: Gait belt Activity Tolerance: Patient tolerated treatment well Patient left: in chair;with call bell/phone within reach;with chair alarm set;with family/visitor present Nurse Communication: Mobility status PT Visit Diagnosis: Difficulty in walking, not elsewhere classified (R26.2);Muscle weakness (generalized) (M62.81);Other symptoms and signs involving the nervous system (R29.898)     Time: 1497-0263 PT Time Calculation (min) (ACUTE ONLY): 27 min  Charges:  $Gait Training: 23-37 mins                     Zenaida Niece, PT, DPT Acute Rehabilitation Pager: [phone]    Zenaida Niece 08/26/2020, 1:01 PM

## 2020-08-26 NOTE — Progress Notes (Signed)
Occupational Therapy Treatment Patient Details Name: Michelle [name] MRN: [id number] DOB: Oct 15, 1932 Today's Date: 08/26/2020    History of present illness 87 year old female with a past medical history of type 2 diabetes, hypertension, hyperlipidemia, CAD, MI who presented to the ED on 1/26 with complaints of altered mental status   OT comments  Pt making slow progress for ADL and transfers, continues to require multimodal cues for safety and fall prevention throughout session. Able to walk min A into bathroom and perform peri care with lateral leans at min guard - but ultimately required max A for rear peri care (Pt able to stand and OT cleaned with warm wash cloth). Pt required max cues for safety and sequencing with RW - and is dependent on it for balance. Son present throughout session and in agreement that SNF is best option to maximize safety and independence in ADL and functional transfers. Slight overlap/dovetail with PT at the end of the session - left Pt ambulating in hallway with PT and son   Follow Up Recommendations  SNF;Supervision/Assistance - 24 hour    Equipment Recommendations  None recommended by OT    Recommendations for Other Services      Precautions / Restrictions Precautions Precautions: Fall Restrictions Weight Bearing Restrictions: No       Mobility Bed Mobility Overal bed mobility: Needs Assistance Bed Mobility: Supine to Sit     Supine to sit: Supervision     General bed mobility comments: supervision for safety, increased time with HOB elevated  Transfers Overall transfer level: Needs assistance Equipment used: Rolling walker (2 wheeled) Transfers: Sit to/from Stand Sit to Stand: Min assist         General transfer comment: vc for safe hand placement, min A for boost and balance    Balance Overall balance assessment: Needs assistance Sitting-balance support: No upper extremity supported;Feet supported Sitting balance-Leahy Scale: Fair      Standing balance support: Bilateral upper extremity supported Standing balance-Leahy Scale: Poor Standing balance comment: reliant on UE support of RW or railing                           ADL either performed or assessed with clinical judgement   ADL Overall ADL's : Needs assistance/impaired     Grooming: Min guard;Wash/dry hands;Cueing for sequencing;Standing Grooming Details (indicate cue type and reason): sink level                 Toilet Transfer: Minimal assistance;Cueing for sequencing;Cueing for safety;Ambulation;RW;Regular Toilet;Grab bars Toilet Transfer Details (indicate cue type and reason): multimodal cues for safety with RW, min A for balance Toileting- Clothing Manipulation and Hygiene: Moderate assistance;Sitting/lateral lean;Sit to/from stand Toileting - Clothing Manipulation Details (indicate cue type and reason): Pt able to perform peri care min guard with lateral leans, OT performed rear peri care with warm wash cloth in standing     Functional mobility during ADLs: Minimal assistance;Cueing for safety;Rolling walker General ADL Comments: Pt fatigues quickly, decreased safety awareness     Vision       Perception     Praxis      Cognition Arousal/Alertness: Awake/alert Behavior During Therapy: WFL for tasks assessed/performed Overall Cognitive Status: Impaired/Different from baseline Area of Impairment: Memory;Safety/judgement;Awareness;Problem solving;Following commands                     Memory: Decreased short-term memory Following Commands: Follows one step commands consistently Safety/Judgement: Decreased awareness of safety;Decreased  awareness of deficits Awareness: Emergent Problem Solving: Difficulty sequencing;Requires tactile cues;Requires verbal cues          Exercises Exercises: General Lower Extremity General Exercises - Lower Extremity Straight Leg Raises: AROM;Both;5 reps;Seated Other Exercises Other  Exercises: PT provides education on bridge exercise, to be performed in bed   Shoulder Instructions       General Comments VSS on RA    Pertinent Vitals/ Pain       Pain Assessment: Faces Faces Pain Scale: Hurts little more Pain Location: Bil knees Pain Descriptors / Indicators: Aching Pain Intervention(s): Monitored during session;Repositioned  Home Living                                          Prior Functioning/Environment              Frequency  Min 2X/week        Progress Toward Goals  OT Goals(current goals can now be found in the care plan section)  Progress towards OT goals: Progressing toward goals (continues to struggle with safety awareness)  Acute Rehab OT Goals Patient Stated Goal: to go to rehab and then return home when safe OT Goal Formulation: With patient/family Time For Goal Achievement: 09/07/20 Potential to Achieve Goals: Good  Plan Discharge plan remains appropriate    Co-evaluation                 AM-PAC OT "6 Clicks" Daily Activity     Outcome Measure   Help from another person eating meals?: A Little Help from another person taking care of personal grooming?: A Little Help from another person toileting, which includes using toliet, bedpan, or urinal?: A Lot Help from another person bathing (including washing, rinsing, drying)?: A Lot Help from another person to put on and taking off regular upper body clothing?: A Lot Help from another person to put on and taking off regular lower body clothing?: A Lot 6 Click Score: 14    End of Session Equipment Utilized During Treatment: Gait belt;Rolling walker  OT Visit Diagnosis: Unsteadiness on feet (R26.81);Other abnormalities of gait and mobility (R26.89);Muscle weakness (generalized) (M62.81);Pain Pain - Right/Left:  (Bilateral) Pain - part of body: Knee   Activity Tolerance Patient limited by fatigue   Patient Left Other (comment) (walking with PT in the  hallway)   Nurse Communication Mobility status        Time: 3532-9924 OT Time Calculation (min): 23 min  Charges: OT General Charges $OT Visit: 1 Visit OT Treatments $Self Care/Home Management : 23-37 mins  Jesse Sans OTR/L Acute Rehabilitation Services Pager: [phone] Office: [place] 08/26/2020, 2:09 PM

## 2020-08-26 NOTE — Progress Notes (Incomplete)
Pt experiencing urinary urgency. RN walked Pt to bathroom standby assist multiple times throughout shift.

## 2020-08-27 DIAGNOSIS — F10231 Alcohol dependence with withdrawal delirium: Secondary | ICD-10-CM

## 2020-08-27 LAB — GLUCOSE, CAPILLARY
Glucose-Capillary: 221 mg/dL — ABNORMAL HIGH (ref 70–99)
Glucose-Capillary: 129 mg/dL — ABNORMAL HIGH (ref 70–99)
Glucose-Capillary: 108 mg/dL — ABNORMAL HIGH (ref 70–99)
Glucose-Capillary: 156 mg/dL — ABNORMAL HIGH (ref 70–99)

## 2020-08-27 LAB — TROPONIN I (HIGH SENSITIVITY)
Troponin I (High Sensitivity): 7 ng/L (ref <18)
Troponin I (High Sensitivity): 8 ng/L (ref <18)

## 2020-08-27 MED ORDER — QUETIAPINE FUMARATE 50 MG PO TABS: 50.0000 mg | ORAL_TABLET | Freq: Every day | ORAL | Status: DC

## 2020-08-27 MED ORDER — QUETIAPINE FUMARATE 50 MG PO TABS: 25.0000 mg | ORAL_TABLET | Freq: Every day | ORAL | Status: DC

## 2020-08-27 MED ORDER — QUETIAPINE FUMARATE 50 MG PO TABS
25.0000 mg | ORAL_TABLET | Freq: Every day | ORAL | Status: DC
  Administered 2020-08-27: 25 mg via ORAL
  Filled 2020-08-27: qty 1

## 2020-08-27 NOTE — Progress Notes (Signed)
PROGRESS NOTE  Michelle [name] PPI:[id number] DOB: Apr 01, 1933   PCP: [name], MD  Patient is from: Home.  Uses walker at baseline.  Fairly functional at baseline.  DOA: 08/12/2020 LOS: 70  Chief complaints: Altered mental status  Brief Narrative / Interim history: 87 year old female with PMH of DM-2, CAD, HTN and HLD brought to ED by EMS after she became "unresponsive with clenched jaws and wide open eyes". She was admitted for delirium of uncertain etiology despite extensive work-up.  However, she had lactic acidosis to 3.5 without fever or leukocytosis that has resolved quickly which raises concern for seizure.  She was also on tramadol which could decrease seizure threshold.  EEG did not demonstrate seizure or epileptiform discharge but cannot rule out.  Apparently she was hospitalized in December 2021 for "intermittent delirium/disorientation" and uncontrolled hypertension.  Patient was a started on Zyprexa as needed for agitation and risperidone twice daily which were later discontinued due to somnolence. She now has intermittent confusion where she can get agitated and combative.  Therapy recommended SNF.  Subjective: Seen and examined earlier this morning.  Reportedly, she had an episode of agitation, yelling, combativeness to the extent of hitting staff members.  Bilateral wrist restraints ordered with as needed Haldol.  She was less combative after Haldol but continued to be agitated and yelling.  This morning, resting quietly in bed.  No restraints.  No agitation.  She says she is uncomfortable lying in bed.  She says her head chest and back are hurting.  She denies shortness of breath, GI or UTI symptoms.  She is oriented x4.  She is also asking when she could go home.   Objective: Vitals:   08/27/20 0333 08/27/20 0401 08/27/20 0803 08/27/20 1234  BP: (!) 128/94 (!) 145/63 (!) 114/56 (!) 120/55  Pulse: 60 73 82 73  Resp:  12 18 18   Temp: 98 F (36.7 C) 98.4 F (36.9  C) 98.5 F (36.9 C) (!) 97.5 F (36.4 C)  TempSrc:  Oral    SpO2: 99% 91% 93% 100%  Weight:        Intake/Output Summary (Last 24 hours) at 08/27/2020 1444 Last data filed at 08/27/2020 1230 Gross per 24 hour  Intake 600 ml  Output 600 ml  Net 0 ml   Filed Weights   08/24/20 0447  Weight: 77.7 kg    Examination:  GENERAL: No apparent distress.  Nontoxic. HEENT: MMM.  Vision and hearing grossly intact.  NECK: Supple.  No apparent JVD.  RESP: On room air.  No IWOB.  Fair aeration bilaterally. CVS:  RRR. Heart sounds normal.  ABD/GI/GU: BS+. Abd soft, NTND.  MSK/EXT:  Moves extremities.  No apparent deformity.  No edema.  Skin changes right hand as below. SKIN: Hematoma over dorsal aspect of right hand.  Bruising over lateral aspect of the right hand and wrist. NEURO: Awake, alert and oriented x4.  No apparent focal neuro deficit. PSYCH: Calm. Normal affect.   Procedures:  None  Microbiology summarized: COVID-19 PCR nonreactive.  Assessment & Plan: Delirium/disorientation/agitation: per patient's son, she became unresponsive with clinched jaws and open eyes concerning for seizure. Her lactic acid was elevated to 3.5 but resolved quickly. She has no history of seizure nor a drinker. She had no evidence of infection. She is on tramadol which could decrease seizure threshold. Reportedly combative in route to ED requiring Versed.  EEG with diffuse encephalopathy but no seizure or epileptiform discharge. CTH without acute finding. Basic labs  sent encephalopathy labs unrevealing. No formal diagnosis of dementia but could have some degree of cognitive impairment. No focal neuro deficit. Did not tolerate antipsychotics, benzo or melatonin thus discontinued. She was agitated and combative last night. She appears calm this morning. -Reorientation and delirium precautions -I would avoid tramadol moving forward given concern for seizure. Scheduled p.o. Tylenol for pain -Trial of p.o.  Seroquel 25 mg at night -As needed IV Ativan as needed for agitation   Controlled IDDM-2 with hyperglycemia, vascular and neurological complication: O2U 2.3%. Recent Labs  Lab 08/26/20 1107 08/26/20 1617 08/26/20 2136 08/27/20 0800 08/27/20 1231  GLUCAP 206* 152* 155* 221* 129*  -Continue Lantus 24 units daily -Continue SSI to moderate -Continue NovoLog 4 units AC -Continue home Crestor  Uncontrolled/resistant hypertension: Improved.  History of OSA?  Hyperaldosteronism excluded. -Continue amlodipine, Lopressor, enalapril, hydralazine and Lasix.  Chronic diastolic CHF: TTE in 5361 with LVEF of 60 to 65%.  Appears euvolemic.  On Lasix at home. -Continue holding Lasix -Monitor fluid status  History of CAD s/p DES stent to RCA in 2015: No anginal symptoms. -Continue home medications  Chronic COPD: Stable. -Continue home breathing treatments  Lactic acidosis: 3.5 on admission but resolved quickly.  Due to seizure?  BRBPR: Reported in the afternoon of 2/4 but no further episode.  H&H stable.  Debility/physical deconditioning: Fairly functional including cooking and ambulates using walker at baseline.  Has required maximum assist up until this afternoon.  Therapy recommended SNF. -TOC working on this      Body mass index is 28.51 kg/m.         DVT prophylaxis:  enoxaparin (LOVENOX) injection 40 mg Start: 08/24/20 1500 Place and maintain sequential compression device Start: 08/21/20 1424  Code Status: Full code Family Communication: Updated patient's son over the phone Level of care: Telemetry Medical Status is: Inpatient  Remains inpatient appropriate because:Altered mental status, Unsafe d/c plan and Inpatient level of care appropriate due to severity of illness   Dispo:  Patient From: Home  Planned Disposition: [place]  Expected discharge date: 08/28/2020  Medically stable for discharge: No         Consultants:  None   Sch Meds:   Scheduled Meds: . acetaminophen  1,000 mg Oral Q8H  . amLODipine  10 mg Oral Daily  . aspirin EC  81 mg Oral Daily  . clopidogrel  75 mg Oral Daily  . diclofenac Sodium  2 g Topical BID  . enalapril  20 mg Oral BID  . enoxaparin (LOVENOX) injection  40 mg Subcutaneous Q24H  . fenofibrate  160 mg Oral Daily  . Gerhardt's butt cream   Topical TID  . hydrALAZINE  50 mg Oral Q8H  . insulin aspart  0-15 Units Subcutaneous TID WC  . insulin aspart  0-5 Units Subcutaneous QHS  . insulin aspart  4 Units Subcutaneous TID WC  . insulin glargine  24 Units Subcutaneous Daily  . metoprolol tartrate  50 mg Oral BID  . mometasone-formoterol  2 puff Inhalation BID  . pantoprazole  40 mg Oral Daily  . QUEtiapine  25 mg Oral QHS  . rosuvastatin  40 mg Oral Daily  . sertraline  100 mg Oral QHS  . sodium chloride flush  3 mL Intravenous Q12H  . umeclidinium bromide  1 puff Inhalation Daily  . vitamin B-12  1,000 mcg Oral Daily   Continuous Infusions: PRN Meds:.labetalol, ondansetron **OR** ondansetron (ZOFRAN) IV  Antimicrobials: Anti-infectives (From admission, onward)   None  I have personally reviewed the following labs and images: CBC: Recent Labs  Lab 08/21/20 0225 08/22/20 0946 08/23/20 0258  WBC 11.0* 8.0 9.8  NEUTROABS  --  4.8  --   HGB 13.7 12.9 12.3  HCT 40.7 39.9 35.8*  MCV 89.6 90.7 88.6  PLT 272 261 245   BMP &GFR Recent Labs  Lab 08/21/20 0225 08/22/20 0946 08/26/20 0800  NA 144 139 140  K 3.6 3.1* 3.5  CL 110 106 106  CO2 24 23 25   GLUCOSE 131* 218* 165*  BUN 24* 25* 15  CREATININE 0.85 0.92 0.72  CALCIUM 9.6 9.3 9.4  MG  --  1.7 1.8  PHOS  --   --  3.0   Estimated Creatinine Clearance: 53 mL/min (by C-G formula based on SCr of 0.72 mg/dL). Liver & Pancreas: Recent Labs  Lab 08/26/20 0800  ALBUMIN 3.3*   No results for input(s): LIPASE, AMYLASE in the last 168 hours. No results for input(s): AMMONIA in the last 168 hours. Diabetic: Recent  Labs    08/26/20 1610  HGBA1C 6.5*   Recent Labs  Lab 08/26/20 1107 08/26/20 1617 08/26/20 2136 08/27/20 0800 08/27/20 1231  GLUCAP 206* 152* 155* 221* 129*   Cardiac Enzymes: No results for input(s): CKTOTAL, CKMB, CKMBINDEX, TROPONINI in the last 168 hours. No results for input(s): PROBNP in the last 8760 hours. Coagulation Profile: No results for input(s): INR, PROTIME in the last 168 hours. Thyroid Function Tests: No results for input(s): TSH, T4TOTAL, FREET4, T3FREE, THYROIDAB in the last 72 hours. Lipid Profile: No results for input(s): CHOL, HDL, LDLCALC, TRIG, CHOLHDL, LDLDIRECT in the last 72 hours. Anemia Panel: No results for input(s): VITAMINB12, FOLATE, FERRITIN, TIBC, IRON, RETICCTPCT in the last 72 hours. Urine analysis:    Component Value Date/Time   COLORURINE YELLOW 08/12/2020 1008   APPEARANCEUR CLEAR 08/12/2020 1008   LABSPEC 1.017 08/12/2020 1008   PHURINE 6.0 08/12/2020 1008   GLUCOSEU 50 (A) 08/12/2020 1008   HGBUR NEGATIVE 08/12/2020 1008   BILIRUBINUR NEGATIVE 08/12/2020 1008   BILIRUBINUR Negative 02/22/2019 1616   KETONESUR NEGATIVE 08/12/2020 1008   PROTEINUR >=300 (A) 08/12/2020 1008   UROBILINOGEN 0.2 02/22/2019 1616   NITRITE NEGATIVE 08/12/2020 1008   LEUKOCYTESUR NEGATIVE 08/12/2020 1008   Sepsis Labs: Invalid input(s): PROCALCITONIN, [place]  Microbiology: No results found for this or any previous visit (from the past 240 hour(s)).  Radiology Studies: No results found.    Taye T. [place]  If 7PM-7AM, please contact night-coverage [email or web address] 08/27/2020, 2:44 PM

## 2020-08-27 NOTE — Plan of Care (Signed)
Waist restraint continues.

## 2020-08-27 NOTE — Progress Notes (Addendum)
Physical Therapy Treatment Patient Details Name: Michelle [name] MRN: [id number] DOB: 1932/09/16 Today's Date: 08/27/2020    History of Present Illness 87 year old female with a past medical history of type 2 diabetes, hypertension, hyperlipidemia, CAD, MI who presented to the ED on 1/26 with complaints of altered mental status    PT Comments    Pt is able to ambulate for increased distances this session and tolerates more activity at this time. Pt with improved use of RW during gait but continues to require cues for hand placement when transferring. Pt does continue to fatigue and experiences knee pain which limits her tolerance for out of bed mobility. Pt also continues to require significant physical assistance for stair training and is unable to negotiate the number of stairs required at home currently. Pt will benefit from continued aggressive mobilization and PT POC to improve activity tolerance and to reduce falls risk. PT continues to recommend SNF placement at this time as this is currently the safest option. Currently the patient would require 24/7 supervision due to impulsivity and poor safety awareness as well as physical assistance for all out of bed mobility to reduce falls risk if discharging home. Pt will also benefit from HHPT and a RW if discharging home.  Pt's son reports the patient would need to be able to walk to the bathroom and get into the shower seat to perform a seated shower without his assistance for him to feel comfortable managing her care at home. Pt and son also seem open to having aide assistance for bathroom and shower management if this is possible to obtain.  Follow Up Recommendations  SNF;Supervision/Assistance - 24 hour (or HHPT with 24/7 supervision and physical assistance for all out of bed mobility)     Equipment Recommendations  Rolling walker with 5" wheels    Recommendations for Other Services       Precautions / Restrictions  Precautions Precautions: Fall Precaution Comments: posey belt in recliner Restrictions Weight Bearing Restrictions: No    Mobility  Bed Mobility                    Transfers Overall transfer level: Needs assistance Equipment used: Rolling walker (2 wheeled) Transfers: Sit to/from Stand Sit to Stand: Min assist;Min guard         General transfer comment: minA progressing to minG, pt requires cues for hand placement  Ambulation/Gait Ambulation/Gait assistance: Min guard Gait Distance (Feet): 60 Feet (additional trial of 50') Assistive device: Rolling walker (2 wheeled) Gait Pattern/deviations: Step-to pattern Gait velocity: reduced Gait velocity interpretation: <1.8 ft/sec, indicate of risk for recurrent falls General Gait Details: pt with short step-to gait, intermittent periods of increased trunk flexion. Pt with improved device management, no longer requiring cues to maintain RW close to BOS.   Stairs Stairs: Yes Stairs assistance: Mod assist Stair Management: One rail Right;Forwards;Step to pattern Number of Stairs: 1 (1 6" step x 3 trials) General stair comments: pt ascends/descends one step for 3 times, utilizes BUE support of R railing, pt leading with L foot up and leading with R foot down. Pt with knee buckle upon descending step on 3rd trial requiring modA to correct   Wheelchair Mobility    Modified Rankin (Stroke Patients Only)       Balance Overall balance assessment: Needs assistance Sitting-balance support: No upper extremity supported;Feet supported Sitting balance-Leahy Scale: Fair     Standing balance support: Bilateral upper extremity supported Standing balance-Leahy Scale: Poor Standing balance comment:  reliant on UE support for most standing, able to tolerate very brief periods without UE support with minG                            Cognition Arousal/Alertness: Awake/alert Behavior During Therapy: WFL for tasks  assessed/performed Overall Cognitive Status: Impaired/Different from baseline Area of Impairment: Memory;Following commands;Problem solving;Awareness;Safety/judgement                     Memory: Decreased short-term memory Following Commands: Follows one step commands consistently Safety/Judgement: Decreased awareness of safety;Decreased awareness of deficits Awareness: Emergent Problem Solving: Difficulty sequencing;Requires verbal cues;Requires tactile cues        Exercises      General Comments General comments (skin integrity, edema, etc.): VSS on RA      Pertinent Vitals/Pain Pain Assessment: Faces Faces Pain Scale: Hurts little more Pain Location: knees Pain Descriptors / Indicators: Aching Pain Intervention(s): Monitored during session    Home Living                      Prior Function            PT Goals (current goals can now be found in the care plan section) Acute Rehab PT Goals Patient Stated Goal: to improve mobility and discharge home Progress towards PT goals: Progressing toward goals    Frequency    Min 4X/week      PT Plan Current plan remains appropriate    Co-evaluation              AM-PAC PT "6 Clicks" Mobility   Outcome Measure  Help needed turning from your back to your side while in a flat bed without using bedrails?: A Little Help needed moving from lying on your back to sitting on the side of a flat bed without using bedrails?: A Little Help needed moving to and from a bed to a chair (including a wheelchair)?: A Little Help needed standing up from a chair using your arms (e.g., wheelchair or bedside chair)?: A Little Help needed to walk in hospital room?: A Little Help needed climbing 3-5 steps with a railing? : A Lot 6 Click Score: 17    End of Session Equipment Utilized During Treatment: Gait belt Activity Tolerance: Patient tolerated treatment well Patient left: in chair;with call bell/phone within  reach;with chair alarm set;with restraints reapplied Nurse Communication: Mobility status PT Visit Diagnosis: Difficulty in walking, not elsewhere classified (R26.2);Muscle weakness (generalized) (M62.81);Other symptoms and signs involving the nervous system (R29.898)     Time: 2878-6767 PT Time Calculation (min) (ACUTE ONLY): 48 min  Charges:  $Gait Training: 23-37 mins $Therapeutic Activity: 8-22 mins                     Zenaida Niece, PT, DPT Acute Rehabilitation Pager: [phone]    Zenaida Niece 08/27/2020, 1:26 PM

## 2020-08-27 NOTE — Progress Notes (Signed)
Update: After administering one time dose of haldol IM patient is less combative but is still agitated and has yelling periods at the staff.

## 2020-08-28 LAB — GLUCOSE, CAPILLARY
Glucose-Capillary: 130 mg/dL — ABNORMAL HIGH (ref 70–99)
Glucose-Capillary: 165 mg/dL — ABNORMAL HIGH (ref 70–99)
Glucose-Capillary: 144 mg/dL — ABNORMAL HIGH (ref 70–99)
Glucose-Capillary: 136 mg/dL — ABNORMAL HIGH (ref 70–99)

## 2020-08-28 MED ORDER — QUETIAPINE FUMARATE 50 MG PO TABS
50.0000 mg | ORAL_TABLET | Freq: Every day | ORAL | Status: DC
  Administered 2020-08-28 – 2020-08-31 (×4): 50 mg via ORAL
  Filled 2020-08-28 (×4): qty 1

## 2020-08-28 MED ORDER — HALOPERIDOL LACTATE 5 MG/ML IJ SOLN
2.0000 mg | INTRAMUSCULAR | Status: DC | PRN
  Administered 2020-08-31: 2 mg via INTRAVENOUS
  Filled 2020-08-28: qty 1

## 2020-08-28 NOTE — Progress Notes (Signed)
PROGRESS NOTE  Michelle [name] OEV:[id number] DOB: [birth year]   PCP: [name], MD  Patient is from: Home.  Uses walker at baseline.  Fairly functional at baseline.  DOA: 08/12/2020 LOS: 14  Chief complaints: Altered mental status  Brief Narrative / Interim history: 87 year old female with PMH of DM-2, CAD, HTN and HLD brought to ED by EMS after she became "unresponsive with clenched jaws and wide open eyes". She was admitted for delirium of uncertain etiology despite extensive work-up.  However, she had lactic acidosis to 3.5 without fever or leukocytosis that has resolved quickly which raises concern for seizure.  She was also on tramadol which could decrease seizure threshold.  EEG did not demonstrate seizure or epileptiform discharge but cannot rule out.  Apparently she was hospitalized in December 2021 for "intermittent delirium/disorientation" and uncontrolled hypertension.  Patient was a started on Zyprexa as needed for agitation and risperidone twice daily which were later discontinued due to somnolence. She now has intermittent confusion, agitation and combativeness mainly at night.  Started Seroquel 25 mg nightly with as needed Haldol.  Therapy recommended SNF.  Subjective: Seen and examined earlier this morning.  Per overnight RN, she was agitated and combative.  She required IV Haldol.  She is somewhat sleepy this morning but wakes to voice easily.  She is oriented x4.  No distress or agitation this morning.   Objective: Vitals:   08/27/20 2300 08/28/20 0408 08/28/20 0742 08/28/20 1153  BP: (!) 141/69 (!) 156/72 (!) 154/50 (!) 118/55  Pulse: 70 78 77 70  Resp: 20 18 18 18   Temp: 98.7 F (37.1 C) 98.2 F (36.8 C) 97.7 F (36.5 C) 99 F (37.2 C)  TempSrc: Oral Oral    SpO2: 98% 96% 95% 96%  Weight:        Intake/Output Summary (Last 24 hours) at 08/28/2020 1413 Last data filed at 08/28/2020 1327 Gross per 24 hour  Intake 118 ml  Output 250 ml  Net -132 ml    Filed Weights   08/24/20 0447  Weight: 77.7 kg    Examination:  GENERAL: No apparent distress.  Nontoxic. HEENT: MMM.  Vision and hearing grossly intact.  NECK: Supple.  No apparent JVD.  RESP:  No IWOB.  Fair aeration bilaterally. CVS:  RRR. Heart sounds normal.  ABD/GI/GU: BS+. Abd soft, NTND.  MSK/EXT:  Moves extremities. No apparent deformity. No edema.  SKIN: no apparent skin lesion or wound NEURO: Sleepy but wakes to voice easily.  Oriented x4.  No apparent focal neuro deficit. PSYCH: Calm. Normal affect.   Procedures:  None  Microbiology summarized: COVID-19 PCR nonreactive.  Assessment & Plan: Delirium/disorientation/agitation: per patient's son, she became unresponsive with clinched jaws and open eyes concerning for seizure. Her lactic acid was elevated to 3.5 but resolved quickly. She has no history of seizure nor a drinker. She had no evidence of infection. She is on tramadol which could decrease seizure threshold. Reportedly combative in route to ED requiring Versed.  EEG with diffuse encephalopathy but no seizure or epileptiform discharge. CTH without acute finding. Basic labs sent encephalopathy labs unrevealing. No formal diagnosis of dementia but could have some degree of cognitive impairment. No focal neuro deficit. Did not tolerate antipsychotics, benzo or melatonin thus discontinued. She was agitated and combative last night. She appears calm this morning. -Reorientation and delirium precautions -I would avoid tramadol moving forward given concern for seizure. Scheduled p.o. Tylenol for pain -Increase Seroquel to 50 mg at night. -IV Haldol  2 mg every 15 minutes x2 as needed for agitation -Patient's reports fear and anxiety when alone in the room. Science writer ordered. -Monitor QTc-416 on her recent EKG.   Controlled IDDM-2 with hyperglycemia, vascular and neurological complication: I3J 8.2%. Recent Labs  Lab 08/27/20 1231 08/27/20 1601  08/27/20 2125 08/28/20 0739 08/28/20 1149  GLUCAP 129* 108* 156* 130* 165*  -Continue Lantus 24 units daily -Continue SSI to moderate -Continue NovoLog 4 units AC -Continue home Crestor  Uncontrolled/resistant hypertension: Normotensive.  History of OSA?  Hyperaldosteronism excluded. -Continue amlodipine, Lopressor, enalapril, hydralazine and Lasix.  Chronic diastolic CHF: TTE in 5053 with LVEF of 60 to 65%.  Appears euvolemic.  On Lasix at home. -Continue holding Lasix -Monitor fluid status  History of CAD s/p DES stent to RCA in 2015: No anginal symptoms. -Continue home medications  Chronic COPD: Stable. -Continue home breathing treatments  Lactic acidosis: 3.5 on admission but resolved quickly.  Due to seizure?  BRBPR: Reported in the afternoon of 2/4 but no further episode.  H&H stable.  Debility/physical deconditioning: Fairly functional including cooking and ambulates using walker at baseline.  Has required maximum assist up until this afternoon.  Therapy recommended SNF. -TOC working on this      Body mass index is 28.51 kg/m.         DVT prophylaxis:  enoxaparin (LOVENOX) injection 40 mg Start: 08/24/20 1500 Place and maintain sequential compression device Start: 08/21/20 1424  Code Status: Full code Family Communication: Updated patient's son over the phone on 2/10 Level of care: Telemetry Medical Status is: Inpatient  Remains inpatient appropriate because:Altered mental status, Unsafe d/c plan and Inpatient level of care appropriate due to severity of illness   Dispo:  Patient From: Home  Planned Disposition: [place]  Expected discharge date: 08/31/2020  Medically stable for discharge: No         Consultants:  None   Sch Meds:  Scheduled Meds: . acetaminophen  1,000 mg Oral Q8H  . amLODipine  10 mg Oral Daily  . aspirin EC  81 mg Oral Daily  . clopidogrel  75 mg Oral Daily  . diclofenac Sodium  2 g Topical BID  .  enalapril  20 mg Oral BID  . enoxaparin (LOVENOX) injection  40 mg Subcutaneous Q24H  . fenofibrate  160 mg Oral Daily  . Gerhardt's butt cream   Topical TID  . hydrALAZINE  50 mg Oral Q8H  . insulin aspart  0-15 Units Subcutaneous TID WC  . insulin aspart  0-5 Units Subcutaneous QHS  . insulin aspart  4 Units Subcutaneous TID WC  . insulin glargine  24 Units Subcutaneous Daily  . metoprolol tartrate  50 mg Oral BID  . mometasone-formoterol  2 puff Inhalation BID  . pantoprazole  40 mg Oral Daily  . QUEtiapine  25 mg Oral QHS  . rosuvastatin  40 mg Oral Daily  . sertraline  100 mg Oral QHS  . sodium chloride flush  3 mL Intravenous Q12H  . umeclidinium bromide  1 puff Inhalation Daily  . vitamin B-12  1,000 mcg Oral Daily   Continuous Infusions: PRN Meds:.labetalol, ondansetron **OR** ondansetron (ZOFRAN) IV  Antimicrobials: Anti-infectives (From admission, onward)   None       I have personally reviewed the following labs and images: CBC: Recent Labs  Lab 08/22/20 0946 08/23/20 0258  WBC 8.0 9.8  NEUTROABS 4.8  --   HGB 12.9 12.3  HCT 39.9 35.8*  MCV 90.7  88.6  PLT 261 245   BMP &GFR Recent Labs  Lab 08/22/20 0946 08/26/20 0800  NA 139 140  K 3.1* 3.5  CL 106 106  CO2 23 25  GLUCOSE 218* 165*  BUN 25* 15  CREATININE 0.92 0.72  CALCIUM 9.3 9.4  MG 1.7 1.8  PHOS  --  3.0   Estimated Creatinine Clearance: 53 mL/min (by C-G formula based on SCr of 0.72 mg/dL). Liver & Pancreas: Recent Labs  Lab 08/26/20 0800  ALBUMIN 3.3*   No results for input(s): LIPASE, AMYLASE in the last 168 hours. No results for input(s): AMMONIA in the last 168 hours. Diabetic: Recent Labs    08/26/20 1610  HGBA1C 6.5*   Recent Labs  Lab 08/27/20 1231 08/27/20 1601 08/27/20 2125 08/28/20 0739 08/28/20 1149  GLUCAP 129* 108* 156* 130* 165*   Cardiac Enzymes: No results for input(s): CKTOTAL, CKMB, CKMBINDEX, TROPONINI in the last 168 hours. No results for  input(s): PROBNP in the last 8760 hours. Coagulation Profile: No results for input(s): INR, PROTIME in the last 168 hours. Thyroid Function Tests: No results for input(s): TSH, T4TOTAL, FREET4, T3FREE, THYROIDAB in the last 72 hours. Lipid Profile: No results for input(s): CHOL, HDL, LDLCALC, TRIG, CHOLHDL, LDLDIRECT in the last 72 hours. Anemia Panel: No results for input(s): VITAMINB12, FOLATE, FERRITIN, TIBC, IRON, RETICCTPCT in the last 72 hours. Urine analysis:    Component Value Date/Time   COLORURINE YELLOW 08/12/2020 1008   APPEARANCEUR CLEAR 08/12/2020 1008   LABSPEC 1.017 08/12/2020 1008   PHURINE 6.0 08/12/2020 1008   GLUCOSEU 50 (A) 08/12/2020 1008   HGBUR NEGATIVE 08/12/2020 1008   BILIRUBINUR NEGATIVE 08/12/2020 1008   BILIRUBINUR Negative 02/22/2019 1616   KETONESUR NEGATIVE 08/12/2020 1008   PROTEINUR >=300 (A) 08/12/2020 1008   UROBILINOGEN 0.2 02/22/2019 1616   NITRITE NEGATIVE 08/12/2020 1008   LEUKOCYTESUR NEGATIVE 08/12/2020 1008   Sepsis Labs: Invalid input(s): PROCALCITONIN, [place]  Microbiology: No results found for this or any previous visit (from the past 240 hour(s)).  Radiology Studies: No results found.    Keia Rask T. [place]  If 7PM-7AM, please contact night-coverage [email or web address] 08/28/2020, 2:13 PM

## 2020-08-28 NOTE — Evaluation (Signed)
Clinical/Bedside Swallow Evaluation Patient Details  Name: Michelle [name] [name] MRN: [id number] Date of Birth: Michelle [name] 16, 1934  Today's Date: 08/28/2020 Time: SLP Start Time (ACUTE ONLY): 1200 SLP Stop Time (ACUTE ONLY): 1215 SLP Time Calculation (min) (ACUTE ONLY): 15 min  Past Medical History:  Past Medical History:  Diagnosis Date  . Anginal pain ([place])   . Arthritis   . AV block, 1st degree   . CAD (coronary artery disease) 1990; 2015   Cardiac cath 1990 with Dr. Lia Foyer and [name] Residual CAD, non obstructive per 2015 cath. STEMI status post stent in August 2015.  Marland Kitchen COPD (chronic obstructive pulmonary disease) ([place])   . Diabetes mellitus type 2, insulin dependent ([place])   . Essential hypertension   . Hyperlipidemia with target LDL less than 70   . Myocardial infarction ([place])   . Pericarditis-post MI (short course of steroids) 03/06/2014  . 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 Shortness of breath    Past Surgical History:  Past Surgical History:  Procedure Laterality Date  . CATARACT EXTRACTION  2020   right and left eye   . CHOLECYSTECTOMY    . CORONARY ANGIOPLASTY WITH STENT PLACEMENT  02/18/14   Promus DES to RCA  . LEFT HEART CATHETERIZATION WITH CORONARY ANGIOGRAM N/A 02/18/2014   Procedure: LEFT HEART CATHETERIZATION WITH CORONARY ANGIOGRAM;  Surgeon: Jettie Booze, MD;  Location: [facility] CATH LAB;  Service: Cardiovascular;  Laterality: N/A;  . PTCA  1990   PTCA of RCA  . TRANSTHORACIC ECHOCARDIOGRAM  02/02/2012   mild LVH, EF 55-60%, Normal WM, Gr 1 DD; Mild MR   HPI:  This is an 87 year old female with a past medical history of type 2 diabetes, hypertension, hyperlipidemia, CAD, MI who presented to the ED on 1/26 with complaints of altered mental  status. Head CT and CXR 1/26 without acute findings. Pt's swallowing was evaluated and she was D/Cd from SLP on 2/3 due to no anticipated improvements in ability to tolerate advanced diet. New orders today to see if she is able to masticate an advanced diet.   Assessment / Plan / Recommendation Clinical Impression  Pt presents with functional oropharyngeal swallow despite absence of teeth. Although mastication/oral phase was slow, it was functional with no residue post-swallow.  Swallow response was swift with no concerns for aspiration.  There are no further signs of dysphagia. Pt is much more alert and interactive than when she was last seen by SLP - she was able to call the kitchen and place her lunch order. Resume a regular diet, thin liquids. No SLP f/u needed. SLP Visit Diagnosis: Dysphagia, unspecified (R13.10)    Aspiration Risk  No limitations    Diet Recommendation   regular diet, thin liquids  Medication Administration: Whole meds with liquid    Other  Recommendations Oral Care Recommendations: Oral care BID   Follow up Recommendations None      Frequency and Duration            Prognosis        Swallow Study   General HPI: This is an 87 year old female with a past medical history of type 2 diabetes, hypertension, hyperlipidemia, CAD, MI who presented to the ED on 1/26 with complaints of altered mental status.  Head CT and CXR 1/26 without acute findings. Pt's swallowing was evaluated and she was D/Cd from SLP on 2/3 due to no anticipated improvements in ability to tolerate advanced diet. New orders today to see if she is able to masticate an advanced diet. Type of Study: Bedside Swallow Evaluation Previous Swallow Assessment:  (see HPI) Diet Prior to this Study: Thin liquids;Dysphagia 1 (puree) Temperature Spikes Noted: No Respiratory Status: Room air History of Recent Intubation: No Behavior/Cognition: Alert;Cooperative;Pleasant mood Oral Cavity Assessment: Within  Functional Limits Oral Care Completed by SLP: No Oral Cavity - Dentition: Edentulous Vision: Functional for self-feeding Self-Feeding Abilities: Able to feed self Patient Positioning: Upright in chair Baseline Vocal Quality: Normal Volitional Cough: Strong Volitional Swallow: Able to elicit    Oral/Motor/Sensory Function Overall Oral Motor/Sensory Function: Within functional limits   Ice Chips Ice chips: Within functional limits   Thin Liquid Thin Liquid: Within functional limits    Nectar Thick Nectar Thick Liquid: Not tested   Honey Thick Honey Thick Liquid: Not tested   Puree Puree: Within functional limits   Solid     Solid: Within functional limits      [name] 08/28/2020,12:15 PM  [name], [place] Office number [phone] Pager [phone]

## 2020-08-28 NOTE — Progress Notes (Signed)
Patient is agitated and being combative; grabbing and hitting staff when trying to provide patient care. Nurse paged on call [facility] MD for prn Haldol IM and/or new orders. Will update if received new orders.

## 2020-08-29 ENCOUNTER — Other Ambulatory Visit: Payer: Self-pay | Admitting: Family Medicine

## 2020-08-29 LAB — GLUCOSE, CAPILLARY
Glucose-Capillary: 219 mg/dL — ABNORMAL HIGH (ref 70–99)
Glucose-Capillary: 129 mg/dL — ABNORMAL HIGH (ref 70–99)
Glucose-Capillary: 104 mg/dL — ABNORMAL HIGH (ref 70–99)
Glucose-Capillary: 197 mg/dL — ABNORMAL HIGH (ref 70–99)

## 2020-08-29 NOTE — Progress Notes (Signed)
Physical Therapy Treatment Patient Details Name: Michelle [name] MRN: [id number] DOB: 02/11/1933 Today's Date: 08/29/2020    History of Present Illness 87 year old female with a past medical history of type 2 diabetes, hypertension, hyperlipidemia, CAD, MI who presented to the ED on 1/26 with complaints of altered mental status    PT Comments    Pt eager to work with therapy today due to strong motivation to return home. The pt was able to demo continued progress with mobility, requiring minA to stand from low surfaces throughout the session. The pt was also able to complete multiple short bouts of ambulation with use of RW and minG for safety, but requires periods of min-modA with onset of L knee pain with mobility. When this happens, the pt has the tendency to lean forward and rest forearms on RW despite attempts at education on lack of safety and stability in this position. The pt was educated on expressing need to sit to rest with onset of pain, and verbalized agreement with education. The pt will continue to benefit from skilled PT to progress functional strength and mobility, as well as to improve safety awareness and ability to direct care.     Follow Up Recommendations  SNF;Supervision/Assistance - 24 hour (or HHPT with 24/7 supervision and physical assistance for all out of bed mobility)     Equipment Recommendations  Rolling walker with 5" wheels;Wheelchair (measurements PT);Wheelchair cushion (measurements PT)    Recommendations for Other Services       Precautions / Restrictions Precautions Precautions: Fall Precaution Comments: pt with safety sitter Restrictions Weight Bearing Restrictions: No    Mobility  Bed Mobility Overal bed mobility: Needs Assistance Bed Mobility: Sit to Supine       Sit to supine: Supervision   General bed mobility comments: supervision for safety, no assist given    Transfers Overall transfer level: Needs assistance Equipment used:  Rolling walker (2 wheeled) Transfers: Sit to/from Stand Sit to Stand: Min assist         General transfer comment: minA with tactile cue and intermittent assist at hips to facilitate hip extension. pt with absent eccentric control unless cued excessively and manually slowed  Ambulation/Gait Ambulation/Gait assistance: Min guard Gait Distance (Feet): 15 Feet (+15 ft +15 ft+ 20 ft) Assistive device: Rolling walker (2 wheeled) Gait Pattern/deviations: Step-through pattern;Decreased stride length;Trunk flexed Gait velocity: reduced   General Gait Details: pt with short steps and frequent need for seated rest due to pain in L knee. benefits from significant cues for hand placement on RW grip and trunk extension/posture       Balance Overall balance assessment: Needs assistance Sitting-balance support: No upper extremity supported;Feet supported Sitting balance-Leahy Scale: Fair Sitting balance - Comments: progressed from minA to minG with intermittent use of UE for support   Standing balance support: Bilateral upper extremity supported Standing balance-Leahy Scale: Poor Standing balance comment: reliant on UE support for most standing, able to tolerate very brief periods without UE support with minG                            Cognition Arousal/Alertness: Awake/alert Behavior During Therapy: WFL for tasks assessed/performed Overall Cognitive Status: Impaired/Different from baseline Area of Impairment: Memory;Following commands;Problem solving;Awareness;Safety/judgement                     Memory: Decreased short-term memory;Decreased recall of precautions Following Commands: Follows one step commands consistently Safety/Judgement: Decreased awareness  of safety;Decreased awareness of deficits Awareness: Emergent Problem Solving: Difficulty sequencing;Requires verbal cues;Requires tactile cues General Comments: Pt following commands, but requires repeated cues  for hand positioning and technique with use of AD/safety with mobility. The pt reports she is "unable to remember new things". Aware of deficits in regards to safety, but continues to do certain things (resting elbows/forearms on RW when fatigued) that are unsafe      Exercises      General Comments General comments (skin integrity, edema, etc.): swelling on dorsum of R hand      Pertinent Vitals/Pain Pain Assessment: Faces Faces Pain Scale: Hurts even more Pain Location: intermitently in L knee Pain Descriptors / Indicators: Grimacing;Discomfort;Sore Pain Intervention(s): Limited activity within patient's tolerance;Monitored during session;Repositioned;Heat applied     PT Goals (current goals can now be found in the care plan section) Acute Rehab PT Goals Patient Stated Goal: to improve mobility and discharge home PT Goal Formulation: With family Time For Goal Achievement: 08/30/20 Potential to Achieve Goals: Fair Progress towards PT goals: Progressing toward goals    Frequency    Min 4X/week      PT Plan Current plan remains appropriate       AM-PAC PT "6 Clicks" Mobility   Outcome Measure  Help needed turning from your back to your side while in a flat bed without using bedrails?: A Little Help needed moving from lying on your back to sitting on the side of a flat bed without using bedrails?: A Little Help needed moving to and from a bed to a chair (including a wheelchair)?: A Little Help needed standing up from a chair using your arms (e.g., wheelchair or bedside chair)?: A Little Help needed to walk in hospital room?: A Little Help needed climbing 3-5 steps with a railing? : A Lot 6 Click Score: 17    End of Session Equipment Utilized During Treatment: Gait belt Activity Tolerance: Patient tolerated treatment well Patient left: in bed;with call bell/phone within reach;with bed alarm set Nurse Communication: Mobility status PT Visit Diagnosis: Difficulty in  walking, not elsewhere classified (R26.2);Muscle weakness (generalized) (M62.81);Other symptoms and signs involving the nervous system (R29.898)     Time: 3276-1470 PT Time Calculation (min) (ACUTE ONLY): 24 min  Charges:  $Gait Training: 8-22 mins $Therapeutic Activity: 8-22 mins                     Karma Ganja, PT, DPT   Acute Rehabilitation Department Pager #: [phone]   Otho Bellows 08/29/2020, 3:14 PM

## 2020-08-29 NOTE — Progress Notes (Signed)
PROGRESS NOTE    Michelle [name]  AST:[id number]  DOB: 1932-12-04  DOA: 08/12/2020 PCP: [name], MD Outpatient Specialists:   Hospital course:  87 year old female with PMH of DM-2, CAD, HTN and HLD brought to ED by EMS after she became "unresponsive with clenched jaws and wide open eyes". She was admitted for delirium of uncertain etiology despite extensive work-up.  However, she had lactic acidosis to 3.5 without fever or leukocytosis that has resolved quickly which raises concern for seizure.  She was also on tramadol which could decrease seizure threshold.  EEG did not demonstrate seizure or epileptiform discharge but cannot rule out.  Apparently she was hospitalized in December 2021 for "intermittent delirium/disorientation" and uncontrolled hypertension.  Patient was a started on Zyprexa as needed for agitation and risperidone twice daily which were later discontinued due to somnolence. She now has intermittent confusion, agitation and combativeness mainly at night.  Started Seroquel 25 mg nightly with as needed Haldol.  Physical therapy recommended SNF.   Subjective:  Patient is awake and alert and denies any confusion.  Sitter who has been with her states that she has been conversant and alert and oriented.  Patient is apparently walked to the bathroom with some assistance.  Apparently patient was not combative overnight as she has been in the past.   Objective: Vitals:   08/29/20 0300 08/29/20 0559 08/29/20 0739 08/29/20 1108  BP: (!) 141/56 (!) 162/62 (!) 114/49 123/62  Pulse: 95  73 69  Resp: 18  18 18   Temp: 98.8 F (37.1 C)  97.9 F (36.6 C) 99 F (37.2 C)  TempSrc: Oral     SpO2: 96%  97% 95%  Weight:        Intake/Output Summary (Last 24 hours) at 08/29/2020 1544 Last data filed at 08/29/2020 0830 Gross per 24 hour  Intake 480 ml  Output --  Net 480 ml   Filed Weights   08/24/20 0447  Weight: 77.7 kg     Exam:  General: Elderly patient  appearing stated age sitting up on side of bed in no acute distress. Eyes: sclera anicteric, conjuctiva mild injection bilaterally CVS: S1-S2, regular  Respiratory:  decreased air entry bilaterally secondary to decreased inspiratory effort, rales at bases  GI: NABS, soft, NT  LE: No edema.  Neuro: A/O x 3, Moving all extremities equally with normal strength, CN 3-12 intact, grossly nonfocal.  Psych: patient is logical and coherent, judgement and insight appear normal, mood and affect appropriate to situation.   Assessment & Plan:   Altered mental status/delirium/agitation Patient appears to be significantly improved from previous report To my exam patient is awake and alert and motivated to work with physical therapy Stents of work-up as to cause of altered mental status is not entirely clear. Patient may have some level of cognitive impairment and she presented as if she was having a seizure however EEG showed diffuse encephalopathy without seizure CT head without acute finding Patient appears to have improved with increased dose of Seroquel to 50 mg.  IDDM Under reasonable control on present regimen  HTN Continue amlodipine, metoprolol, enalapril, hydralazine and Lasix  HFpEF Patient appears euvolemic at present off her home Lasix  CAD No anginal symptoms, continue home meds  COPD  Continue home bronchodilator regimen   DVT prophylaxis: Lovenox Code Status: Full Family Communication: None Disposition Plan:   Patient is from: Home  Anticipated Discharge Location: SNF  Barriers to Discharge: Just improving from delirium  Is  patient medically stable for Discharge: Not yet   Consultants:  None  Procedures:  None  Antimicrobials:  None   Data Reviewed:  Basic Metabolic Panel: Recent Labs  Lab 08/26/20 0800  NA 140  K 3.5  CL 106  CO2 25  GLUCOSE 165*  BUN 15  CREATININE 0.72  CALCIUM 9.4  MG 1.8  PHOS 3.0   Liver Function Tests: Recent Labs   Lab 08/26/20 0800  ALBUMIN 3.3*   No results for input(s): LIPASE, AMYLASE in the last 168 hours. No results for input(s): AMMONIA in the last 168 hours. CBC: Recent Labs  Lab 08/23/20 0258  WBC 9.8  HGB 12.3  HCT 35.8*  MCV 88.6  PLT 245   Cardiac Enzymes: No results for input(s): CKTOTAL, CKMB, CKMBINDEX, TROPONINI in the last 168 hours. BNP (last 3 results) No results for input(s): PROBNP in the last 8760 hours. CBG: Recent Labs  Lab 08/28/20 1149 08/28/20 1601 08/28/20 2123 08/29/20 0740 08/29/20 1107  GLUCAP 165* 144* 136* 219* 129*    No results found for this or any previous visit (from the past 240 hour(s)).    Studies: No results found.   Scheduled Meds: . acetaminophen  1,000 mg Oral Q8H  . amLODipine  10 mg Oral Daily  . aspirin EC  81 mg Oral Daily  . clopidogrel  75 mg Oral Daily  . diclofenac Sodium  2 g Topical BID  . enalapril  20 mg Oral BID  . enoxaparin (LOVENOX) injection  40 mg Subcutaneous Q24H  . fenofibrate  160 mg Oral Daily  . Gerhardt's butt cream   Topical TID  . hydrALAZINE  50 mg Oral Q8H  . insulin aspart  0-15 Units Subcutaneous TID WC  . insulin aspart  0-5 Units Subcutaneous QHS  . insulin aspart  4 Units Subcutaneous TID WC  . insulin glargine  24 Units Subcutaneous Daily  . metoprolol tartrate  50 mg Oral BID  . mometasone-formoterol  2 puff Inhalation BID  . pantoprazole  40 mg Oral Daily  . QUEtiapine  50 mg Oral QHS  . rosuvastatin  40 mg Oral Daily  . sertraline  100 mg Oral QHS  . sodium chloride flush  3 mL Intravenous Q12H  . umeclidinium bromide  1 puff Inhalation Daily  . vitamin B-12  1,000 mcg Oral Daily   Continuous Infusions:  Principal Problem:   Acute metabolic encephalopathy Active Problems:   Essential hypertension   Back pain, lumbosacral   CAD- RCA PCI '90s, STEMI-RCA DES 02/18/14   Chronic diastolic CHF (congestive heart failure) ([place])   Delirium   Lactic acidosis   Hyperlipidemia    Agitated   DM (diabetes mellitus), secondary, uncontrolled, w/neurologic complic ([place]) and long-term insulin use   Hypokalemia     Tynika Luddy Derek Jack, [facility]  If 7PM-7AM, please contact night-coverage [email or web address] Password [facility] 08/29/2020, 3:44 PM    LOS: 16 days

## 2020-08-30 LAB — GLUCOSE, CAPILLARY
Glucose-Capillary: 132 mg/dL — ABNORMAL HIGH (ref 70–99)
Glucose-Capillary: 156 mg/dL — ABNORMAL HIGH (ref 70–99)
Glucose-Capillary: 128 mg/dL — ABNORMAL HIGH (ref 70–99)
Glucose-Capillary: 159 mg/dL — ABNORMAL HIGH (ref 70–99)

## 2020-08-30 NOTE — Progress Notes (Signed)
PROGRESS NOTE    Michelle [name]  NUU:[id number]  DOB: March 29, 1933  DOA: 08/12/2020 PCP: [name], MD Outpatient Specialists:   Hospital course:  87 year old female with PMH of DM-2, CAD, HTN and HLD brought to ED by EMS after she became "unresponsive with clenched jaws and wide open eyes". She was admitted for delirium of uncertain etiology despite extensive work-up.  However, she had lactic acidosis to 3.5 without fever or leukocytosis that has resolved quickly which raises concern for seizure.  She was also on tramadol which could decrease seizure threshold.  EEG did not demonstrate seizure or epileptiform discharge but cannot rule out.  Apparently she was hospitalized in December 2021 for "intermittent delirium/disorientation" and uncontrolled hypertension.  Patient was a started on Zyprexa as needed for agitation and risperidone twice daily which were later discontinued due to somnolence. She now has intermittent confusion, agitation and combativeness mainly at night.  Started Seroquel 25 mg nightly with as needed Haldol.  Physical therapy recommended SNF.   Subjective:  Patient seen in conjunction with her son who was at bedside today.  Patient lives with her son.  His main concern is that she would like to go home right away.  She feels like she is able to be safe at home and promises that she will not fall.  Patient son would like to see if she can get a rehab bed on Monday or Tuesday and if that is not possible he is willing to take her home.  Patient adamantly denies being confused at all.  She knows she is in the hospital she knows why she was here and she states that she is ready to go home.    Objective: Vitals:   08/30/20 1010 08/30/20 1146 08/30/20 1317 08/30/20 1529  BP:  (!) 150/63 (!) 145/60 (!) 153/60  Pulse:  73  74  Resp:  16  16  Temp:  97.6 F (36.4 C)  98.1 F (36.7 C)  TempSrc:      SpO2: 94% 96%  97%  Weight:        Intake/Output Summary  (Last 24 hours) at 08/30/2020 1610 Last data filed at 08/30/2020 3474 Gross per 24 hour  Intake 480 ml  Output --  Net 480 ml   Filed Weights   08/24/20 0447  Weight: 77.7 kg     Exam:  General: Elderly patient appearing stated age sitting up in bed with attentive son at bedside eyes: sclera anicteric, conjuctiva mild injection bilaterally CVS: S1-S2, regular  Respiratory:  decreased air entry bilaterally secondary to decreased inspiratory effort, rales at bases  GI: NABS, soft, NT  LE: No edema.  Neuro: A/O x 3, Moving all extremities equally with normal strength, CN 3-12 intact, grossly nonfocal.  Psych: patient is logical and coherent, judgement and insight appear normal, mood and affect appropriate to situation.   Assessment & Plan:   Altered mental status/delirium/agitation Mental status seems to have resolved to baseline. She denies being confused and in fact she seems quite coherent and logical However she is adamant about going home now rather than to rehab Patient lives with her son and her son is willing to take her home if there are no rehab options available by Monday or Tuesday.  Previous cause of altered mental status was not entirely clear. Patient seem to have significantly improved with increased dose of Seroquel at 50 mg at bedtime Patient may have some level of cognitive impairment and she presented as if  she was having a seizure however EEG showed diffuse encephalopathy without seizure CT head without acute finding  IDDM Under reasonable control on present regimen  HTN Continue amlodipine, metoprolol, enalapril, hydralazine and Lasix  HFpEF Patient appears euvolemic at present off her home Lasix  CAD No anginal symptoms, continue home meds  COPD  Continue home bronchodilator regimen   DVT prophylaxis: Lovenox Code Status: Full Family Communication: None Disposition Plan:   Patient is from: Home  Anticipated Discharge Location: SNF  Barriers  to Discharge: Just improving from delirium  Is patient medically stable for Discharge: Not yet   Consultants:  None  Procedures:  None  Antimicrobials:  None   Data Reviewed:  Basic Metabolic Panel: Recent Labs  Lab 08/26/20 0800  NA 140  K 3.5  CL 106  CO2 25  GLUCOSE 165*  BUN 15  CREATININE 0.72  CALCIUM 9.4  MG 1.8  PHOS 3.0   Liver Function Tests: Recent Labs  Lab 08/26/20 0800  ALBUMIN 3.3*   No results for input(s): LIPASE, AMYLASE in the last 168 hours. No results for input(s): AMMONIA in the last 168 hours. CBC: No results for input(s): WBC, NEUTROABS, HGB, HCT, MCV, PLT in the last 168 hours. Cardiac Enzymes: No results for input(s): CKTOTAL, CKMB, CKMBINDEX, TROPONINI in the last 168 hours. BNP (last 3 results) No results for input(s): PROBNP in the last 8760 hours. CBG: Recent Labs  Lab 08/29/20 1559 08/29/20 2037 08/30/20 0743 08/30/20 1143 08/30/20 1536  GLUCAP 104* 197* 132* 156* 128*    No results found for this or any previous visit (from the past 240 hour(s)).    Studies: No results found.   Scheduled Meds: . acetaminophen  1,000 mg Oral Q8H  . amLODipine  10 mg Oral Daily  . aspirin EC  81 mg Oral Daily  . clopidogrel  75 mg Oral Daily  . diclofenac Sodium  2 g Topical BID  . enalapril  20 mg Oral BID  . enoxaparin (LOVENOX) injection  40 mg Subcutaneous Q24H  . fenofibrate  160 mg Oral Daily  . Gerhardt's butt cream   Topical TID  . hydrALAZINE  50 mg Oral Q8H  . insulin aspart  0-15 Units Subcutaneous TID WC  . insulin aspart  0-5 Units Subcutaneous QHS  . insulin aspart  4 Units Subcutaneous TID WC  . insulin glargine  24 Units Subcutaneous Daily  . metoprolol tartrate  50 mg Oral BID  . mometasone-formoterol  2 puff Inhalation BID  . pantoprazole  40 mg Oral Daily  . QUEtiapine  50 mg Oral QHS  . rosuvastatin  40 mg Oral Daily  . sertraline  100 mg Oral QHS  . sodium chloride flush  3 mL Intravenous Q12H  .  umeclidinium bromide  1 puff Inhalation Daily  . vitamin B-12  1,000 mcg Oral Daily   Continuous Infusions:  Principal Problem:   Acute metabolic encephalopathy Active Problems:   Essential hypertension   Back pain, lumbosacral   CAD- RCA PCI '90s, STEMI-RCA DES 02/18/14   Chronic diastolic CHF (congestive heart failure) ([place])   Delirium   Lactic acidosis   Hyperlipidemia   Agitated   DM (diabetes mellitus), secondary, uncontrolled, w/neurologic complic ([place]) and long-term insulin use   Hypokalemia     Naarah Borgerding Derek Jack, [facility]  If 7PM-7AM, please contact night-coverage [email or web address] Password [email or web address] 08/30/2020, 4:10 PM    LOS: 17 days

## 2020-08-31 LAB — CBC
HCT: 34.8 % — ABNORMAL LOW (ref 36.0–46.0)
Hemoglobin: 11.7 g/dL — ABNORMAL LOW (ref 12.0–15.0)
MCH: 30.2 pg (ref 26.0–34.0)
MCHC: 33.6 g/dL (ref 30.0–36.0)
MCV: 89.7 fL (ref 80.0–100.0)
Platelets: 278 10*3/uL (ref 150–400)
RBC: 3.88 MIL/uL (ref 3.87–5.11)
RDW: 13.1 % (ref 11.5–15.5)
WBC: 8.5 10*3/uL (ref 4.0–10.5)
nRBC: 0 % (ref 0.0–0.2)

## 2020-08-31 LAB — GLUCOSE, CAPILLARY
Glucose-Capillary: 108 mg/dL — ABNORMAL HIGH (ref 70–99)
Glucose-Capillary: 178 mg/dL — ABNORMAL HIGH (ref 70–99)
Glucose-Capillary: 120 mg/dL — ABNORMAL HIGH (ref 70–99)
Glucose-Capillary: 124 mg/dL — ABNORMAL HIGH (ref 70–99)

## 2020-08-31 LAB — BASIC METABOLIC PANEL
Anion gap: 9 (ref 5–15)
BUN: 17 mg/dL (ref 8–23)
CO2: 25 mmol/L (ref 22–32)
Calcium: 9.2 mg/dL (ref 8.9–10.3)
Chloride: 105 mmol/L (ref 98–111)
Creatinine, Ser: 0.85 mg/dL (ref 0.44–1.00)
GFR, Estimated: >60 mL/min (ref >60)
Glucose, Bld: 115 mg/dL — ABNORMAL HIGH (ref 70–99)
Potassium: 3.4 mmol/L — ABNORMAL LOW (ref 3.5–5.1)
Sodium: 139 mmol/L (ref 135–145)

## 2020-08-31 LAB — MAGNESIUM: Magnesium: 1.7 mg/dL (ref 1.7–2.4)

## 2020-08-31 NOTE — Progress Notes (Signed)
Patient remains agitated, getting out of bed, not following simple commands.Patient at one point became combative scratched peeled nurse Katie on her wrist while trying keeping patient safe and in bed. PRN haldol PRN agitation administered to no avail. Will continue monitoring

## 2020-08-31 NOTE — Progress Notes (Signed)
[facility] PROGRESS NOTE    Michelle [name]  QVZ:[id number] DOB: [birth year] DOA: 08/12/2020 PCP: [name], MD      Brief Narrative:  87 year old female with PMH of DM-2, CAD, HTN and HLD brought to ED by EMS after she became "unresponsive with clenched jaws and wide open eyes". She was admitted for delirium of uncertain etiology despite extensive work-up. However, she had lactic acidosis to 3.5 without fever or leukocytosis that has resolved quickly which raises concern for seizure. She was also on tramadol which could decrease seizure threshold. EEG did not demonstrate seizure or epileptiform discharge but cannot rule out. Apparently she was hospitalized in December 2021 for "intermittent delirium/disorientation" and uncontrolled hypertension. Patient was a started on Zyprexa as needed for agitation and risperidone twice daily which were later discontinued due to somnolence. She now has intermittent confusion,agitation and combativeness mainly at night. Started Seroquel 25 mg nightly with as needed Haldol.     Assessment & Plan:  Acute metabolic encephalopathy This appears to have resolved  Diabetes -Continue Lantus and sliding scale corrections -Continue mealtime insulin  Hypertension Chronic diastolic CHF CAD -Continue amlodipine -Continue aspirin, Plavix -Continue enalapril -Continue fenofibrate, metoprolol  COPD No active flare -Continue ICS/LABA/LAMA -Continue PPI  Mood disorder -Continue sertraline, quetiapine   Fall overnight Patient fell overnight, she is a high fall risk.  No signs of injury today.        Disposition: Status is: Inpatient  Remains inpatient appropriate because:Unsafe d/c plan   Dispo:  Patient From: Home  Planned Disposition: [facility]  Expected discharge date: 08/31/2020  Medically stable for discharge: No         Level of care: Telemetry Medical       MDM: The below  labs and imaging reports were reviewed and summarized above.  Medication management as above.   DVT prophylaxis: enoxaparin (LOVENOX) injection 40 mg Start: 08/24/20 1500 Place and maintain sequential compression device Start: 08/21/20 1424  Code Status: FULL Family Communication:              Subjective: No headache, limb pain, back pain, confusion. No vomiting, diarrhea. No chest pain, dyspnea. No wheezing.  Objective: Vitals:   08/31/20 0525 08/31/20 0735 08/31/20 0926 08/31/20 1114  BP: (!) 161/75 (!) 144/67  (!) 118/51  Pulse:  76  63  Resp:  14  13  Temp:  98 F (36.7 C)  98 F (36.7 C)  TempSrc:  Axillary  Axillary  SpO2:  94% 98% 96%  Weight:       No intake or output data in the 24 hours ending 08/31/20 1213 Filed Weights   08/24/20 0447  Weight: 77.7 kg    Examination: General appearance:  adult female, alert and in  distress.   HEENT: Anicteric, conjunctiva pink, lids and lashes normal. No nasal deformity, discharge, epistaxis.  Lips moist.   Skin: Warm and dry.  no jaundice.  No suspicious rashes or lesions. Cardiac: RRR, nl S1-S2, no murmurs appreciated.  Capillary refill is brisk.  JVP normal.  No LE edema.  Radial  pulses 2+ and symmetric. Respiratory: Normal respiratory rate and rhythm.  CTAB without rales or wheezes. Abdomen: Abdomen soft.  no TTP or guarding. No ascites, distension, hepatosplenomegaly.   MSK: No deformities or effusions. Neuro: Awake and alert.  EOMI, moves all extremities. Speech fluent.    Psych: Sensorium intact and responding to questions, attention normal. Affect .  Judgment and insight appear moderately impaired,  impulsive.    Data Reviewed: I have personally reviewed following labs and imaging studies:  CBC: Recent Labs  Lab 08/31/20 0751  WBC 8.5  HGB 11.7*  HCT 34.8*  MCV 89.7  PLT 654   Basic Metabolic Panel: Recent Labs  Lab 08/26/20 0800 08/31/20 0751  NA 140 139  K 3.5 3.4*  CL 106 105  CO2 25 25   GLUCOSE 165* 115*  BUN 15 17  CREATININE 0.72 0.85  CALCIUM 9.4 9.2  MG 1.8 1.7  PHOS 3.0  --    GFR: Estimated Creatinine Clearance: 49.9 mL/min (by C-G formula based on SCr of 0.85 mg/dL). Liver Function Tests: Recent Labs  Lab 08/26/20 0800  ALBUMIN 3.3*   No results for input(s): LIPASE, AMYLASE in the last 168 hours. No results for input(s): AMMONIA in the last 168 hours. Coagulation Profile: No results for input(s): INR, PROTIME in the last 168 hours. Cardiac Enzymes: No results for input(s): CKTOTAL, CKMB, CKMBINDEX, TROPONINI in the last 168 hours. BNP (last 3 results) No results for input(s): PROBNP in the last 8760 hours. HbA1C: No results for input(s): HGBA1C in the last 72 hours. CBG: Recent Labs  Lab 08/30/20 1143 08/30/20 1536 08/30/20 2202 08/31/20 0733 08/31/20 1109  GLUCAP 156* 128* 159* 108* 178*   Lipid Profile: No results for input(s): CHOL, HDL, LDLCALC, TRIG, CHOLHDL, LDLDIRECT in the last 72 hours. Thyroid Function Tests: No results for input(s): TSH, T4TOTAL, FREET4, T3FREE, THYROIDAB in the last 72 hours. Anemia Panel: No results for input(s): VITAMINB12, FOLATE, FERRITIN, TIBC, IRON, RETICCTPCT in the last 72 hours. Urine analysis:    Component Value Date/Time   COLORURINE YELLOW 08/12/2020 1008   APPEARANCEUR CLEAR 08/12/2020 1008   LABSPEC 1.017 08/12/2020 1008   PHURINE 6.0 08/12/2020 1008   GLUCOSEU 50 (A) 08/12/2020 1008   HGBUR NEGATIVE 08/12/2020 1008   BILIRUBINUR NEGATIVE 08/12/2020 1008   BILIRUBINUR Negative 02/22/2019 1616   KETONESUR NEGATIVE 08/12/2020 1008   PROTEINUR >=300 (A) 08/12/2020 1008   UROBILINOGEN 0.2 02/22/2019 1616   NITRITE NEGATIVE 08/12/2020 1008   LEUKOCYTESUR NEGATIVE 08/12/2020 1008   Sepsis Labs: @LABRCNTIP (procalcitonin:4,lacticacidven:4)  )No results found for this or any previous visit (from the past 240 hour(s)).       Radiology Studies: No results found.      Scheduled  Meds: . acetaminophen  1,000 mg Oral Q8H  . amLODipine  10 mg Oral Daily  . aspirin EC  81 mg Oral Daily  . clopidogrel  75 mg Oral Daily  . diclofenac Sodium  2 g Topical BID  . enalapril  20 mg Oral BID  . enoxaparin (LOVENOX) injection  40 mg Subcutaneous Q24H  . fenofibrate  160 mg Oral Daily  . Gerhardt's butt cream   Topical TID  . hydrALAZINE  50 mg Oral Q8H  . insulin aspart  0-15 Units Subcutaneous TID WC  . insulin aspart  0-5 Units Subcutaneous QHS  . insulin aspart  4 Units Subcutaneous TID WC  . insulin glargine  24 Units Subcutaneous Daily  . metoprolol tartrate  50 mg Oral BID  . mometasone-formoterol  2 puff Inhalation BID  . pantoprazole  40 mg Oral Daily  . QUEtiapine  50 mg Oral QHS  . rosuvastatin  40 mg Oral Daily  . sertraline  100 mg Oral QHS  . sodium chloride flush  3 mL Intravenous Q12H  . umeclidinium bromide  1 puff Inhalation Daily  . vitamin B-12  1,000 mcg Oral Daily  Continuous Infusions:   LOS: 18 days    Time spent: 25 minutes    Edwin Dada, MD [facility] 08/31/2020, 12:13 PM     Please page though [place] or Epic secure chat:  For [employer], contact charge nurse     Estimated body mass index is 28.51 kg/m as calculated from the following:   Height as of 07/30/20: 5\' 5"  (1.651 m).   Weight as of this encounter: 77.7 kg. Malnutrition Type:   Malnutrition Characteristics:   Nutrition Interventions:      . acetaminophen  1,000 mg Oral Q8H  . amLODipine  10 mg Oral Daily  . aspirin EC  81 mg Oral Daily  . clopidogrel  75 mg Oral Daily  . diclofenac Sodium  2 g Topical BID  . enalapril  20 mg Oral BID  . enoxaparin (LOVENOX) injection  40 mg Subcutaneous Q24H  . fenofibrate  160 mg Oral Daily  . Gerhardt's butt cream   Topical TID  . hydrALAZINE  50 mg Oral Q8H  . insulin aspart  0-15 Units Subcutaneous TID WC  . insulin aspart  0-5 Units Subcutaneous QHS  . insulin aspart  4 Units Subcutaneous TID  WC  . insulin glargine  24 Units Subcutaneous Daily  . metoprolol tartrate  50 mg Oral BID  . mometasone-formoterol  2 puff Inhalation BID  . pantoprazole  40 mg Oral Daily  . QUEtiapine  50 mg Oral QHS  . rosuvastatin  40 mg Oral Daily  . sertraline  100 mg Oral QHS  . sodium chloride flush  3 mL Intravenous Q12H  . umeclidinium bromide  1 puff Inhalation Daily  . vitamin B-12  1,000 mcg Oral Daily     haloperidol lactate, labetalol, ondansetron **OR** ondansetron (ZOFRAN) IV   Current Meds  Medication Sig  . acetaminophen (TYLENOL) 500 MG tablet Take 1,000 mg by mouth See admin instructions. Takes 1 tablet (500 mg totally) by mouth in the morning; takes 1 tablet (500 mg) in the afternoon and bedtime as needed for pain  . albuterol (PROVENTIL) (2.5 MG/3ML) 0.083% nebulizer solution USE THREE MILLILITERS VIA NEBULIZATION BY MOUTH EVERY 6 HOURS AS NEEDED FOR WHEEZING OR SHORTNESS OF BREATH (Patient taking differently: Take 2.5 mg by nebulization every 6 (six) hours as needed for wheezing or shortness of breath.)  . albuterol (VENTOLIN HFA) 108 (90 Base) MCG/ACT inhaler Inhale 1 puff into the lungs every 4 (four) hours as needed for wheezing or shortness of breath (RESCUE inhaler if advair not working).  Marland Kitchen amLODipine (NORVASC) 10 MG tablet TAKE 1 TABLET EVERY DAY (Patient taking differently: Take 10 mg by mouth daily.)  . aspirin 81 MG tablet Take 81 mg by mouth daily.   . calcium carbonate (OSCAL) 1500 (600 Ca) MG TABS tablet Take 600 mg of elemental calcium by mouth daily with breakfast.  . cholecalciferol (VITAMIN D3) 25 MCG (1000 UNIT) tablet Take 1,000 Units by mouth daily.  . enalapril (VASOTEC) 20 MG tablet TAKE 1 TABLET TWICE DAILY (Patient taking differently: Take 20 mg by mouth 2 (two) times daily.)  . fenofibrate micronized (LOFIBRA) 134 MG capsule Take 1 capsule (134 mg total) by mouth daily.  . ferrous sulfate 325 (65 FE) MG tablet Twice a week (Patient taking differently: Take  325 mg by mouth See admin instructions. Takes 1 tablet (325 mg totally) by mouth every Mon and Friday)  . fish oil-omega-3 fatty acids 1000 MG capsule Take 1 g by mouth 2 (two)  times daily.  . Fluticasone-Salmeterol (ADVAIR) 250-50 MCG/DOSE AEPB INHALE ONE PUFF BY MOUTH TWICE DAILY (Patient taking differently: Inhale 1 puff into the lungs in the morning and at bedtime.)  . folic acid (FOLVITE) 831 MCG tablet Take 400 mcg by mouth daily.  . furosemide (LASIX) 40 MG tablet Take 1 tablet (40 mg total) by mouth daily.  . insulin glargine (LANTUS SOLOSTAR) 100 UNIT/ML Solostar Pen INJECT  17  TO  20 UNITS SUBCUTANEOUSLY EVERY MORNING (Patient taking differently: Inject 18 Units into the skin daily.)  . Melatonin 5 MG CAPS Take 1 capsule by mouth at bedtime.   . metFORMIN (GLUCOPHAGE) 1000 MG tablet TAKE 1 TABLET TWICE DAILY (Patient taking differently: Take 1,000 mg by mouth 2 (two) times daily with a meal.)  . metoprolol tartrate (LOPRESSOR) 25 MG tablet TAKE 1 TABLET TWICE DAILY (Patient taking differently: Take 25 mg by mouth 2 (two) times daily.)  . nitroGLYCERIN (NITROSTAT) 0.4 MG SL tablet PLACE 1 TABLET (0.4 MG TOTAL) UNDER THE TONGUE EVERY 5 (FIVE) MINUTESAS NEEDED FOR CHEST PAIN (CHEST PAIN OR SHORTNESS OF BREATH). (Patient taking differently: Place 0.4 mg under the tongue every 5 (five) minutes as needed for chest pain (max 3 doses).)  . pantoprazole (PROTONIX) 40 MG tablet Take 1 tablet (40 mg total) by mouth daily.  . rosuvastatin (CRESTOR) 40 MG tablet TAKE 1 TABLET EVERY DAY (Patient taking differently: Take 40 mg by mouth daily.)  . sertraline (ZOLOFT) 50 MG tablet Take 2 tablets (100 mg total) by mouth at bedtime. (Patient taking differently: Take 100 mg by mouth daily.)  . SPIRIVA HANDIHALER 18 MCG inhalation capsule PLACE 1 CAPSULE INTO HANDIHALER AND INHALE CONTENTS OF 1 CAPSULE EVERY DAY AS DIRECTED AT 2 PM (Patient taking differently: Place 18 mcg into inhaler and inhale daily.)  .  spironolactone (ALDACTONE) 25 MG tablet Take 1 tablet (25 mg total) by mouth daily.  . traMADol (ULTRAM) 50 MG tablet TAKE ONE TABLET BY MOUTH THREE TIMES A DAY AS NEEDED (Patient taking differently: Take 50 mg by mouth See admin instructions. Takes 1 tablet (50 mg totally) by mouth in the morning; takes 1 tablet (50 mg) in the afternoon and bedtime as needed for pain)  . vitamin B-12 (CYANOCOBALAMIN) 1000 MCG tablet Take 1,000 mcg by mouth daily.  . vitamin E 400 UNIT capsule Take 400 Units by mouth at bedtime.  . [DISCONTINUED] clopidogrel (PLAVIX) 75 MG tablet TAKE ONE TABLET BY MOUTH DAILY **PLEASE KEEP UPCOMING APPOINTMENT IN NOVEMBER WITH DOCTOR [facility] FOR FUTURE REFILLS (Patient taking differently: Take 75 mg by mouth daily.)

## 2020-08-31 NOTE — TOC Progression Note (Signed)
Transition of Care [facility]) - Progression Note    Patient Details  Name: Michelle [name] MRN: [id number] Date of Birth: 1933/04/05  Transition of Care [facility]) CM/SW [place], [place] Phone Number: 08/31/2020, 3:32 PM  Clinical Narrative:     Received call from [facility]- they have declined   CSW will continued to follow and assist with discharge planning.  Thurmond Butts, MSW, LCSW Clinical Social Worker   Expected Discharge Plan: Skilled Nursing Facility Barriers to Discharge: Continued Medical Work up  Expected Discharge Plan and Services Expected Discharge Plan: [place] In-house Referral: Clinical Social Work     Living arrangements for the past 2 months: Single Family Home                                       Social Determinants of Health (SDOH) Interventions    Readmission Risk Interventions No flowsheet data found.

## 2020-08-31 NOTE — Progress Notes (Signed)
Patient had an unwitnessed fall. Patient was observed to be sitting at the foot of the bed on the floor. Per patient, she changed into her night gown because she was getting ready to go to the kitchen to cook some breakfast. Patient stated that her left leg was giving up and she just lowered herself and sat down. Patient stated she did not hit her head. Assessment performed, and patient assisted back in bed. MD ( [facility]), Dr. Kennon Holter notified via testing. Family notified [name]) son.

## 2020-08-31 NOTE — TOC Progression Note (Signed)
Transition of Care [facility]) - Progression Note    Patient Details  Name: Michelle [name] MRN: [id number] Date of Birth: 1933-01-24  Transition of Care [facility]) CM/SW [place], [place] Phone Number: 08/31/2020, 1:31 PM  Clinical Narrative:     Patient has no bed offer-  [facility]-left voice message  [facility] -declined  [facility] - left voice message  [facility] -declined [facility]-declined [facility]-declined [facility]-declined [facility]declined      Expected Discharge Plan: [place] Barriers to Discharge: Continued Medical Work up  Expected Discharge Plan and Services Expected Discharge Plan: [place] In-house Referral: Clinical Social Work     Living arrangements for the past 2 months: Single Family Home                                       Social Determinants of Health (SDOH) Interventions    Readmission Risk Interventions No flowsheet data found.

## 2020-08-31 NOTE — Progress Notes (Signed)
Physical Therapy Treatment Patient Details Name: Michelle [name] MRN: [id number] DOB: 05/11/1933 Today's Date: 08/31/2020    History of Present Illness 87 year old female with a past medical history of type 2 diabetes, hypertension, hyperlipidemia, CAD, MI who presented to the ED on 1/26 with complaints of altered mental status. Pt sustained unwitnessed fall on morning of 2/14 without injury.    PT Comments    The pt continues to make good progress with PT at this time. She is able to walk multiple bouts of 25 - 45 ft with use of RW and minG only for safety. She does continue to benefit from prompting about need for seated rest to avoid onset of severe knee pain, but was able to voice need to sit when prompted x4 today. The pt also states she fell when ambulating without staff this morning due to her feet getting crossed while walking which caused her to fall. The pt was very apologetic and acknowledged the safety concerns with mobilizing independently, and states she would like to return home when able. The pt completed a series of LE exercises geared towards practicing transitions to and from floor to improve pt ability to recover from any future falls.   Given the pt's mobility today, I feel she could manage short bouts of ambulation in the home with use of RW and her son present, as well as longer bouts with use of a WC. Will continue to progress towards d/c home.    Follow Up Recommendations  SNF;Supervision/Assistance - 24 hour (or HHPT with 24/7 supervision and physical assistance for all out of bed mobility)      Equipment Recommendations  Rolling walker with 5" wheels;Wheelchair (measurements PT);Wheelchair cushion (measurements PT)    Recommendations for Other Services       Precautions / Restrictions Precautions Precautions: Fall Precaution Comments: pt with safety sitter Restrictions Weight Bearing Restrictions: No    Mobility  Bed Mobility Overal bed mobility: Needs  Assistance             General bed mobility comments: pt up in recliner upon arrival of PT    Transfers Overall transfer level: Needs assistance Equipment used: Rolling walker (2 wheeled) Transfers: Sit to/from Stand Sit to Stand: Min assist         General transfer comment: minA with tactile cue and intermittent assist at hips to facilitate hip extension. improved eccentric lower today when cued with each sit  Ambulation/Gait Ambulation/Gait assistance: Min guard Gait Distance (Feet): 25 Feet (+25 ft + 45 ft) Assistive device: Rolling walker (2 wheeled) Gait Pattern/deviations: Step-through pattern;Decreased stride length;Trunk flexed Gait velocity: 0.15 m/s Gait velocity interpretation: <1.31 ft/sec, indicative of household ambulator General Gait Details: pt with short steps and frequent need for seated rest due to pain in L knee. benefits from significant cues for hand placement on RW grip and trunk extension/posture       Balance Overall balance assessment: Needs assistance Sitting-balance support: No upper extremity supported;Feet supported Sitting balance-Leahy Scale: Fair Sitting balance - Comments: progressed from minA to minG with intermittent use of UE for support   Standing balance support: Bilateral upper extremity supported Standing balance-Leahy Scale: Poor Standing balance comment: reliant on UE support for most standing, able to tolerate very brief periods without UE support with minG                            Cognition Arousal/Alertness: Awake/alert Behavior During Therapy: [facility] for  tasks assessed/performed Overall Cognitive Status: Impaired/Different from baseline Area of Impairment: Following commands;Safety/judgement;Problem solving;Awareness                     Memory: Decreased short-term memory;Decreased recall of precautions Following Commands: Follows one step commands consistently;Follows one step commands with increased  time Safety/Judgement: Decreased awareness of safety;Decreased awareness of deficits Awareness: Emergent Problem Solving: Difficulty sequencing;Requires verbal cues;Requires tactile cues General Comments: pt following commands, but requires repeated cues in regards to energy conservation and taking rest breaks prior to onset of significant pain in LLE. Pt stating she knew walking to the bathroom on her own was a bad idea, but is thankful she did not get injured. Seems to be much more clear cognitively than during night      Exercises General Exercises - Lower Extremity Long Arc Quad: AROM;Both;10 reps;Seated Heel Raises: AROM;Both;10 reps;Seated Other Exercises Other Exercises: standing lunges with BUE support on bed rail. x4 each leg    General Comments General comments (skin integrity, edema, etc.): VSS opn RA. irritation, bruising, and swelling to bilateral hands (L > R)      Pertinent Vitals/Pain Pain Assessment: Faces Faces Pain Scale: Hurts even more Pain Location: intermitently in L knee Pain Descriptors / Indicators: Grimacing;Discomfort;Sore Pain Intervention(s): Limited activity within patient's tolerance;Monitored during session;Repositioned           PT Goals (current goals can now be found in the care plan section) Acute Rehab PT Goals Patient Stated Goal: to improve mobility and discharge home PT Goal Formulation: With family Time For Goal Achievement: 09/14/20 Potential to Achieve Goals: Fair Progress towards PT goals: Progressing toward goals    Frequency    Min 4X/week      PT Plan Current plan remains appropriate       AM-PAC PT "6 Clicks" Mobility   Outcome Measure  Help needed turning from your back to your side while in a flat bed without using bedrails?: A Little Help needed moving from lying on your back to sitting on the side of a flat bed without using bedrails?: A Little Help needed moving to and from a bed to a chair (including a  wheelchair)?: A Little Help needed standing up from a chair using your arms (e.g., wheelchair or bedside chair)?: A Little Help needed to walk in hospital room?: A Little Help needed climbing 3-5 steps with a railing? : A Lot 6 Click Score: 17    End of Session Equipment Utilized During Treatment: Gait belt Activity Tolerance: Patient tolerated treatment well Patient left: in chair;with call bell/phone within reach;with chair alarm set Nurse Communication: Mobility status PT Visit Diagnosis: Difficulty in walking, not elsewhere classified (R26.2);Muscle weakness (generalized) (M62.81);Other symptoms and signs involving the nervous system (T34.287)     Time: 6811-5726 PT Time Calculation (min) (ACUTE ONLY): 23 min  Charges:  $Gait Training: 8-22 mins $Therapeutic Exercise: 8-22 mins                     Karma Ganja, PT, DPT   Acute Rehabilitation Department Pager #: [phone]   Otho Bellows 08/31/2020, 3:48 PM

## 2020-09-01 LAB — COMPREHENSIVE METABOLIC PANEL
ALT: 20 U/L (ref 0–44)
AST: 24 U/L (ref 15–41)
Albumin: 2.9 g/dL — ABNORMAL LOW (ref 3.5–5.0)
Alkaline Phosphatase: 37 U/L — ABNORMAL LOW (ref 38–126)
Anion gap: 8 (ref 5–15)
BUN: 16 mg/dL (ref 8–23)
CO2: 24 mmol/L (ref 22–32)
Calcium: 9 mg/dL (ref 8.9–10.3)
Chloride: 107 mmol/L (ref 98–111)
Creatinine, Ser: 0.86 mg/dL (ref 0.44–1.00)
GFR, Estimated: >60 mL/min (ref >60)
Glucose, Bld: 89 mg/dL (ref 70–99)
Potassium: 3.7 mmol/L (ref 3.5–5.1)
Sodium: 139 mmol/L (ref 135–145)
Total Bilirubin: 0.4 mg/dL (ref 0.3–1.2)
Total Protein: 5.7 g/dL — ABNORMAL LOW (ref 6.5–8.1)

## 2020-09-01 LAB — CBC
HCT: 34.4 % — ABNORMAL LOW (ref 36.0–46.0)
Hemoglobin: 11.3 g/dL — ABNORMAL LOW (ref 12.0–15.0)
MCH: 29.9 pg (ref 26.0–34.0)
MCHC: 32.8 g/dL (ref 30.0–36.0)
MCV: 91 fL (ref 80.0–100.0)
Platelets: 240 10*3/uL (ref 150–400)
RBC: 3.78 MIL/uL — ABNORMAL LOW (ref 3.87–5.11)
RDW: 13.1 % (ref 11.5–15.5)
WBC: 8.3 10*3/uL (ref 4.0–10.5)
nRBC: 0 % (ref 0.0–0.2)

## 2020-09-01 LAB — GLUCOSE, CAPILLARY
Glucose-Capillary: 97 mg/dL (ref 70–99)
Glucose-Capillary: 128 mg/dL — ABNORMAL HIGH (ref 70–99)

## 2020-09-01 MED ORDER — FUROSEMIDE 20 MG PO TABS: 20.0000 mg | ORAL_TABLET | Freq: Every day | ORAL | 3 refills | Status: DC

## 2020-09-01 MED ORDER — METOPROLOL TARTRATE 50 MG PO TABS: 50.0000 mg | ORAL_TABLET | Freq: Two times a day (BID) | ORAL | 3 refills | Status: DC

## 2020-09-01 MED ORDER — HYDRALAZINE HCL 50 MG PO TABS: 50.0000 mg | ORAL_TABLET | Freq: Three times a day (TID) | ORAL | 3 refills | Status: DC

## 2020-09-01 MED ORDER — QUETIAPINE FUMARATE 25 MG PO TABS: 50.0000 mg | ORAL_TABLET | Freq: Every day | ORAL | 0 refills | Status: DC

## 2020-09-01 MED ORDER — DICLOFENAC SODIUM 1 % EX GEL: 2.0000 g | Freq: Two times a day (BID) | CUTANEOUS | Status: DC

## 2020-09-01 NOTE — Discharge Summary (Signed)
Physician Discharge Summary  Michelle [name]:[id number] DOB: 1932/12/04 DOA: 08/12/2020  PCP: [name], MD  Admit date: 08/12/2020 Discharge date: 09/01/2020  Admitted From: Home  Disposition:  Home with [facility]   Recommendations for Outpatient Follow-up:  1. Follow up with PCP Dr. Yong Channel in 1-2 weeks 2. Follow up with Cardiology, Dr. Oval Linsey as scheduled 3. Dr. Yong Channel: Please titrate off Seroquel 4. Dr. Yong Channel: Please check BP and BMP in 1 week on current antihypertensive regimen (restarted furosemide lower dose, new hydralazine)       Home Health: PT/RN due to confusion inability to leave house alone  Equipment/Devices: Wheelchair, rolling walker  Discharge Condition: IMproving  CODE STATUS: FULL Diet recommendation: Cardiac  Brief/Interim Summary: Michelle [name] is an 87 y.o. F with CAD, difficult to control HTN, recently worsening, and DM who presented with acute confusion.  In the ER, afebrile, WBC normal, CT head unremarkable, blood pressure labile, but as high as 186/81.         PRINCIPAL HOSPITAL DIAGNOSIS: Delirium, suspected acute hypertensive encephalopathy    Discharge Diagnoses:   Acute metabolic encephalopathy, likely hypertensive encephalopathy Patient initially encephalopathic generally with normal CT head and no obvious infection or electrolyte dernagement.    There was some initial consideration of seizure given her "clenched teeth" presentation episode resolved elevated lactate at admission, but history was inconsistent, patient was evaluated by neurology, EEG was unremarkable, and ultimately this was not thought to be the case as she had no further seizures during her hospitalization for 2 weeks.    Neuraxial imaging was unremarkable.  TSh, ammonia, RPR, B12 unremarkable.  She had waxing and waning encephalopathy/delirium, which gradually improved with initiation of Seroquel.    Difficult to treat hypertension The patient's amlodipine,  enalapril, and spironolactone were continued.  Her furosemide dose was reduced given some questionable dehydration, and her metoprolol dose was increased.  Hydralazine was added.  She has a follow-up appointment with the advanced hypertension clinic soon.  Diabetes Patient's glucoses were mostly controlled in the hospital, no change to previous regimen.  Atherosclerotic vascular disease No change in regimen  Chronic diastolic CHF  COPD  No active disease. Continue ICS/LABA/LAMA.         Discharge Instructions  Discharge Instructions    Diet - low sodium heart healthy   Complete by: As directed    Discharge instructions   Complete by: As directed    From Dr. Loleta Books: You were admitted for confusion You were evaluated by the Neurologist (brain specialist) and we completed brain imaging (which ruled out stroke or bleeding in the brain, or cancer), an electroencephalogram/EEG (to rule out underlying seizure activity) remdesivir vitamin levels and test for infection.  None of this testing definitely identified the cause of your confusion, and are leading candidate at this time as it was from high blood pressure in the setting of some underlying dementia. For your blood pressure, you should resume amlodipine, enalapril, and spironolactone without change We reduced her dose of furosemide, and increased her dose of metoprolol We have also started a new medicine hydralazine, take this three times daily You should also follow-up with advanced hypertension clinic and Dr. Oval Linsey   With respect to your other medicines, you should continue aspirin, Plavix, fenofibrate, insulins, and Metformin without change You should also continue your supplements like Os-Cal, fish oil, ferrous sulfate/iron, vitamin D, vitamin D B12 and others without change  Lastly because of the confusion in the hospital, we did start quetiapine/Seroquel temporarily. This  is a medicine for agitation, but isn't  intended long term.  He should continue this for short time, but asked Dr Yong Channel about stopping it.   Increase activity slowly   Complete by: As directed    No wound care   Complete by: As directed      Allergies as of 09/01/2020      Reactions   Codeine Sulfate Nausea Only   Morphine Sulfate Nausea Only      Medication List    STOP taking these medications   traMADol 50 MG tablet Commonly known as: ULTRAM     TAKE these medications   Accu-Chek FastClix Lancets Misc   Accu-Chek SmartView Control Liqd   Accu-Chek SmartView test strip Generic drug: glucose blood TEST BLOOD SUGAR THREE TIMES DAILY   acetaminophen 500 MG tablet Commonly known as: TYLENOL Take 1,000 mg by mouth See admin instructions. Takes 1 tablet (500 mg totally) by mouth in the morning; takes 1 tablet (500 mg) in the afternoon and bedtime as needed for pain   albuterol (2.5 MG/3ML) 0.083% nebulizer solution Commonly known as: PROVENTIL USE THREE MILLILITERS VIA NEBULIZATION BY MOUTH EVERY 6 HOURS AS NEEDED FOR WHEEZING OR SHORTNESS OF BREATH What changed: See the new instructions.   albuterol 108 (90 Base) MCG/ACT inhaler Commonly known as: VENTOLIN HFA Inhale 1 puff into the lungs every 4 (four) hours as needed for wheezing or shortness of breath (RESCUE inhaler if advair not working). What changed: Another medication with the same name was changed. Make sure you understand how and when to take each.   amLODipine 10 MG tablet Commonly known as: NORVASC TAKE 1 TABLET EVERY DAY   aspirin 81 MG tablet Take 81 mg by mouth daily.   B-D SINGLE USE SWABS REGULAR Pads USE  FOR  TESTING THREE TIMES DAILY   blood glucose meter kit and supplies Kit Dispense based on patient and insurance preference. Use up to four times daily as directed. (FOR ICD-9 250.00, 250.01).   calcium carbonate 1500 (600 Ca) MG Tabs tablet Commonly known as: OSCAL Take 600 mg of elemental calcium by mouth daily with breakfast.    cholecalciferol 25 MCG (1000 UNIT) tablet Commonly known as: VITAMIN D3 Take 1,000 Units by mouth daily.   clopidogrel 75 MG tablet Commonly known as: PLAVIX TAKE ONE TABLET BY MOUTH DAILY **PLEASE KEEP UPCOMING APPOINTMENT IN NOVEMBER WITH DOCTOR [facility] FOR FUTURE REFILLS What changed: See the new instructions.   diclofenac Sodium 1 % Gel Commonly known as: VOLTAREN Apply 2 g topically 2 (two) times daily.   Droplet Pen Needles 31G X 8 MM Misc Generic drug: Insulin Pen Needle USE AS DIRECTED WITH LANTUS SOLOSTAR   enalapril 20 MG tablet Commonly known as: VASOTEC TAKE 1 TABLET TWICE DAILY   fenofibrate micronized 134 MG capsule Commonly known as: LOFIBRA Take 1 capsule (134 mg total) by mouth daily.   ferrous sulfate 325 (65 FE) MG tablet Twice a week What changed:   how much to take  how to take this  when to take this  additional instructions   fish oil-omega-3 fatty acids 1000 MG capsule Take 1 g by mouth 2 (two) times daily.   Fluticasone-Salmeterol 250-50 MCG/DOSE Aepb Commonly known as: ADVAIR INHALE ONE PUFF BY MOUTH TWICE DAILY What changed:   how much to take  how to take this  when to take this  additional instructions   folic acid 335 MCG tablet Commonly known as: FOLVITE Take 400 mcg by  mouth daily.   furosemide 20 MG tablet Commonly known as: LASIX Take 1 tablet (20 mg total) by mouth daily. What changed:   medication strength  how much to take   hydrALAZINE 50 MG tablet Commonly known as: APRESOLINE Take 1 tablet (50 mg total) by mouth every 8 (eight) hours.   Lantus SoloStar 100 UNIT/ML Solostar Pen Generic drug: insulin glargine INJECT  17  TO  20 UNITS SUBCUTANEOUSLY EVERY MORNING What changed:   how much to take  how to take this  when to take this  additional instructions   Melatonin 5 MG Caps Take 1 capsule by mouth at bedtime.   metFORMIN 1000 MG tablet Commonly known as: GLUCOPHAGE TAKE 1 TABLET TWICE  DAILY What changed: when to take this   metoprolol tartrate 50 MG tablet Commonly known as: LOPRESSOR Take 1 tablet (50 mg total) by mouth 2 (two) times daily. What changed:   medication strength  how much to take   nitroGLYCERIN 0.4 MG SL tablet Commonly known as: NITROSTAT PLACE 1 TABLET (0.4 MG TOTAL) UNDER THE TONGUE EVERY 5 (FIVE) MINUTESAS NEEDED FOR CHEST PAIN (CHEST PAIN OR SHORTNESS OF BREATH). What changed:   how much to take  how to take this  when to take this  reasons to take this  additional instructions   pantoprazole 40 MG tablet Commonly known as: PROTONIX Take 1 tablet (40 mg total) by mouth daily.   QUEtiapine 25 MG tablet Commonly known as: SEROQUEL Take 2 tablets (50 mg total) by mouth at bedtime.   rosuvastatin 40 MG tablet Commonly known as: CRESTOR TAKE 1 TABLET EVERY DAY   sertraline 50 MG tablet Commonly known as: ZOLOFT Take 2 tablets (100 mg total) by mouth at bedtime. What changed: when to take this   Spiriva HandiHaler 18 MCG inhalation capsule Generic drug: tiotropium PLACE 1 CAPSULE INTO HANDIHALER AND INHALE CONTENTS OF 1 CAPSULE EVERY DAY AS DIRECTED AT 2 PM What changed: See the new instructions.   spironolactone 25 MG tablet Commonly known as: Aldactone Take 1 tablet (25 mg total) by mouth daily.   vitamin B-12 1000 MCG tablet Commonly known as: CYANOCOBALAMIN Take 1,000 mcg by mouth daily.   vitamin E 180 MG (400 UNITS) capsule Take 400 Units by mouth at bedtime.       Follow-up Information    [facility] Follow up.   Specialty: Home Health Services Why: HHRN/PT arranged- they will contact you within 48 hr post discharge to schedule first visit Contact information: [place] [address] [phone]        [name], MD. Schedule an appointment as soon as possible for a visit in 1 week(s).   Specialty: Family Medicine Contact information: [address]  [place] [address] [phone]        Skeet Latch, MD. Go to.   Specialty: Cardiology Contact information: [address] [place] [address] [phone]              Allergies  Allergen Reactions  . Codeine Sulfate Nausea Only  . Morphine Sulfate Nausea Only    Consultations:  Neurology   Procedures/Studies: CT Head Wo Contrast  Result Date: 08/12/2020 CLINICAL DATA:  Altered mental status EXAM: CT HEAD WITHOUT CONTRAST TECHNIQUE: Contiguous axial images were obtained from the base of the skull through the vertex without intravenous contrast. COMPARISON:  06/25/2019 FINDINGS: Brain: No evidence of acute infarction, hemorrhage, hydrocephalus, extra-axial collection  or mass lesion/mass effect. Mild chronic white matter ischemic changes are noted. Vascular: No hyperdense vessel or unexpected calcification. Skull: Normal. Negative for fracture or focal lesion. Sinuses/Orbits: No acute finding. Other: None. IMPRESSION: Mild chronic white matter ischemic change. No acute abnormality is noted. Electronically Signed   By: Inez Catalina M.D.   On: 08/12/2020 11:22   DG Chest Port 1 View  Result Date: 08/12/2020 CLINICAL DATA:  Altered mental status. EXAM: PORTABLE CHEST 1 VIEW COMPARISON:  June 24, 2020 FINDINGS: Multiple leads project over the chest. Image rotated slightly to the RIGHT. Accounting for this cardiomediastinal contours and hilar structures are stable. Lungs are clear. No sign of effusion on frontal radiograph. On limited assessment no acute skeletal process. IMPRESSION: No acute cardiopulmonary disease. Electronically Signed   By: Zetta Bills M.D.   On: 08/12/2020 10:05   EEG adult  Result Date: 08/13/2020 Lora Havens, MD     08/13/2020 12:21 PM Patient Name: Michelle [name] MRN: [id number] Epilepsy Attending: Lora Havens Referring Physician/Provider: Dr. Fuller Plan Date: 08/13/2020 Duration: 23.05 mins Patient history: 51  old female with altered mental status. EEG to evaluate for seizures. Level of alertness: Awake AEDs during EEG study: None Technical aspects: This EEG study was done with scalp electrodes positioned according to the 10-20 International system of electrode placement. Electrical activity was acquired at a sampling rate of _0  and reviewed with a high frequency filter of _1  and a low frequency filter of _2 . EEG data were recorded continuously and digitally stored. Description: The posterior dominant rhythm consists of  8-9 Hz activity of moderate voltage (25-35 uV) seen predominantly in posterior head regions, symmetric and reactive to eye opening and eye closing. EEG showed intermittent generalized 3 to 5 Hz theta-delta slowing. Hyperventilation and photic stimulation were not performed.   ABNORMALITY -Intermittent slow, generalized IMPRESSION: This study is suggestive of mild diffuse encephalopathy, nonspecific etiology. No seizures or epileptiform discharges were seen throughout the recording. Priyanka Barbra Sarks       Subjective: The patient has no complaints.  She has no headache, confusion, swelling, dyspnea, chest pain.  No seizures or loss of consciousness  Discharge Exam: Vitals:   09/01/20 0748 09/01/20 0906  BP: (!) 151/58   Pulse: 75   Resp: 20   Temp: 98.1 F (36.7 C)   SpO2: 98% 100%   Vitals:   09/01/20 0431 09/01/20 0631 09/01/20 0748 09/01/20 0906  BP: (!) 157/69 (!) 165/61 (!) 151/58   Pulse: 68  75   Resp: 20  20   Temp: (!) 97.3 F (36.3 C)  98.1 F (36.7 C)   TempSrc: Axillary     SpO2: 100%  98% 100%  Weight:        General: Pt is alert, awake, not in acute distress, sitting up in recliner. Cardiovascular: RRR, nl S1-S2, no murmurs appreciated.   No LE edema.   Respiratory: Normal respiratory rate and rhythm.  CTAB without rales or wheezes. Abdominal: Abdomen soft and non-tender.  No distension or HSM.   Neuro/Psych: Strength symmetric in upper and lower  extremities.  Judgment and insight appear slightly impaired, but oriented to person, place, time, and situation.   The results of significant diagnostics from this hospitalization (including imaging, microbiology, ancillary and laboratory) are listed below for reference.     Microbiology: No results found for this or any previous visit (from the past 240 hour(s)).   Labs: BNP (last 3 results) No results for input(s): BNP  in the last 8760 hours. Basic Metabolic Panel: Recent Labs  Lab 08/26/20 0800 08/31/20 0751 09/01/20 0333  NA 140 139 139  K 3.5 3.4* 3.7  CL 106 105 107  CO2 _0 GLUCOSE 165* 115* 89  BUN _1 CREATININE 0.72 0.85 0.86  CALCIUM 9.4 9.2 9.0  MG 1.8 1.7  --   PHOS 3.0  --   --    Liver Function Tests: Recent Labs  Lab 08/26/20 0800 09/01/20 0333  AST  --  24  ALT  --  20  ALKPHOS  --  37*  BILITOT  --  0.4  PROT  --  5.7*  ALBUMIN 3.3* 2.9*   No results for input(s): LIPASE, AMYLASE in the last 168 hours. No results for input(s): AMMONIA in the last 168 hours. CBC: Recent Labs  Lab 08/31/20 0751 09/01/20 0333  WBC 8.5 8.3  HGB 11.7* 11.3*  HCT 34.8* 34.4*  MCV 89.7 91.0  PLT 278 240   Cardiac Enzymes: No results for input(s): CKTOTAL, CKMB, CKMBINDEX, TROPONINI in the last 168 hours. BNP: Invalid input(s): POCBNP CBG: Recent Labs  Lab 08/31/20 1109 08/31/20 1655 08/31/20 2107 09/01/20 0744 09/01/20 1138  GLUCAP 178* 120* 124* 97 128*   D-Dimer No results for input(s): DDIMER in the last 72 hours. Hgb A1c No results for input(s): HGBA1C in the last 72 hours. Lipid Profile No results for input(s): CHOL, HDL, LDLCALC, TRIG, CHOLHDL, LDLDIRECT in the last 72 hours. Thyroid function studies No results for input(s): TSH, T4TOTAL, T3FREE, THYROIDAB in the last 72 hours.  Invalid input(s): FREET3 Anemia work up No results for input(s): VITAMINB12, FOLATE, FERRITIN, TIBC, IRON, RETICCTPCT in the last 72  hours. Urinalysis    Component Value Date/Time   COLORURINE YELLOW 08/12/2020 1008   APPEARANCEUR CLEAR 08/12/2020 1008   LABSPEC 1.017 08/12/2020 1008   PHURINE 6.0 08/12/2020 1008   GLUCOSEU 50 (A) 08/12/2020 1008   HGBUR NEGATIVE 08/12/2020 1008   BILIRUBINUR NEGATIVE 08/12/2020 1008   BILIRUBINUR Negative 02/22/2019 [place] 08/12/2020 1008   PROTEINUR >=300 (A) 08/12/2020 1008   UROBILINOGEN 0.2 02/22/2019 1616   NITRITE NEGATIVE 08/12/2020 1008   LEUKOCYTESUR NEGATIVE 08/12/2020 1008   Sepsis Labs Invalid input(s): PROCALCITONIN,  WBC,  LACTICIDVEN Microbiology No results found for this or any previous visit (from the past 240 hour(s)).   Time coordinating discharge: 35 minutes The [place] controlled substances registry was reviewed for this patient     SIGNED:   Edwin Dada, MD  [facility] 09/01/2020, 12:34 PM

## 2020-09-01 NOTE — Consult Note (Signed)
° °  [facility] CM Inpatient Consult   09/01/2020  Michelle [name] 04-25-1933 [id number]   Follow up:  [facility] Medicare for post hospital referral  Patient did not have a SNF bed and transitioned home with home health, long length of stay 20 days.  Plan:  Assign patient for [facility] follow up.  [name], RN BSN [facility] Liaison  [phone] business mobile phone Toll free office [phone]  Fax number: [phone] [name]

## 2020-09-01 NOTE — Progress Notes (Signed)
Physical Therapy Treatment Patient Details Name: Michelle [name] MRN: [id number] DOB: Feb 08, 1933 Today's Date: 09/01/2020    History of Present Illness 87 year old female with a past medical history of type 2 diabetes, hypertension, hyperlipidemia, CAD, MI who presented to the ED on 1/26 with complaints of altered mental status. Pt sustained unwitnessed fall on morning of 2/14 without injury.    PT Comments    Pt participated well and improved with ambulation tolerance and was able to complete stair negotiation necessary to enter home. Discussed with son the sequencing of "up with the good, down with the bad". PT instructed son on how to properly hold gait belt to assist pt on the stairs, son return demonstrated. Acute PT to cont to follow.    Follow Up Recommendations  Home health PT;Supervision/Assistance - 24 hour     Equipment Recommendations  Rolling walker with 5" wheels;Wheelchair (measurements PT);Wheelchair cushion (measurements PT)    Recommendations for Other Services       Precautions / Restrictions Precautions Precautions: Fall Restrictions Weight Bearing Restrictions: No    Mobility  Bed Mobility               General bed mobility comments: pt up in recliner with son and Dr. Loleta Books in the room    Transfers Overall transfer level: Needs assistance Equipment used: Rolling walker (2 wheeled) Transfers: Sit to/from Stand Sit to Stand: Min assist         General transfer comment: minA to steady during transition of hands from chair to RW as pt with posterior lean requiring minA to prevent fall back into chair, verbal cues to reach back for arm rests to help control descent  Ambulation/Gait Ambulation/Gait assistance: Min guard;+2 safety/equipment (2nd person for chair follow) Gait Distance (Feet): 32 Feet Assistive device: Rolling walker (2 wheeled) Gait Pattern/deviations: Step-through pattern;Decreased stride length Gait velocity: dec Gait  velocity interpretation: <1.31 ft/sec, indicative of household ambulator General Gait Details: pt with short step length due to bilat knee pain, verbal cues to not step past front of walker   Stairs Stairs: Yes Stairs assistance: Mod assist Stair Management: One rail Right;Forwards Number of Stairs: 3 General stair comments: pt used hand rail on the R and L HHA On the way up, pt requiring modA for stability via L HHA, pt used both hands on hand rail going down, minA to steady   Wheelchair Mobility    Modified Rankin (Stroke Patients Only)       Balance Overall balance assessment: Needs assistance Sitting-balance support: No upper extremity supported;Feet supported Sitting balance-Leahy Scale: Fair     Standing balance support: Bilateral upper extremity supported Standing balance-Leahy Scale: Poor Standing balance comment: reliant on UE support for most standing, able to tolerate very brief periods without UE support with minG                            Cognition Arousal/Alertness: Awake/alert Behavior During Therapy: WFL for tasks assessed/performed Overall Cognitive Status: Impaired/Different from baseline Area of Impairment: Memory;Safety/judgement                     Memory: Decreased short-term memory;Decreased recall of precautions   Safety/Judgement: Decreased awareness of safety;Decreased awareness of deficits     General Comments: pt able to follow simple commands for stair negotiation, pt reports some irritation with son but was pleasant with therapist and tech and participated well      Exercises  General Comments General comments (skin integrity, edema, etc.): VSS      Pertinent Vitals/Pain Pain Assessment: Faces Faces Pain Scale: Hurts little more Pain Location: reports bilat knee pain with ambulation Pain Descriptors / Indicators: Sore (antalgic gait) Pain Intervention(s): Limited activity within patient's tolerance     Home Living                      Prior Function            PT Goals (current goals can now be found in the care plan section) Acute Rehab PT Goals Patient Stated Goal: go home Progress towards PT goals: Progressing toward goals    Frequency    Min 3X/week      PT Plan Discharge plan needs to be updated    Co-evaluation              AM-PAC PT "6 Clicks" Mobility   Outcome Measure  Help needed turning from your back to your side while in a flat bed without using bedrails?: A Little Help needed moving from lying on your back to sitting on the side of a flat bed without using bedrails?: A Little Help needed moving to and from a bed to a chair (including a wheelchair)?: A Little Help needed standing up from a chair using your arms (e.g., wheelchair or bedside chair)?: A Little Help needed to walk in hospital room?: A Little Help needed climbing 3-5 steps with a railing? : A Lot 6 Click Score: 17    End of Session Equipment Utilized During Treatment: Gait belt Activity Tolerance: Patient tolerated treatment well Patient left: in chair;with call bell/phone within reach;with chair alarm set;with family/visitor present (son present) Nurse Communication: Mobility status PT Visit Diagnosis: Difficulty in walking, not elsewhere classified (R26.2);Muscle weakness (generalized) (M62.81);Other symptoms and signs involving the nervous system (R29.898)     Time: 1212-1230 PT Time Calculation (min) (ACUTE ONLY): 18 min  Charges:  $Gait Training: 8-22 mins                     Kittie Plater, PT, DPT Acute Rehabilitation Services Pager #: [phone] Office #: [phone]    Berline Lopes 09/01/2020, 1:01 PM

## 2020-09-01 NOTE — TOC Transition Note (Signed)
Transition of Care (TOC) - CM/SW Discharge Note Marvetta Gibbons RN,BSN Transitions of Care Unit 4NP (non trauma) - RN Case Manager See Treatment Team for direct Phone #   Patient Details  Name: Michelle [name] MRN: [id number] Date of Birth: 23-Oct-1932  Transition of Care [facility]) CM/SW Contact:  Dawayne Patricia, RN Phone Number: 09/01/2020, 12:31 PM   Clinical Narrative:    Pt stable for transition home today, per CSW there are no SNF beds available and pt will transition home today per MD. Reinaldo Meeker at the bedside, this CM spoke with pt and son at the bedside for transition of care needs- Per conversation pt and son report that pt has RW and BSC at home, pt's other son has ordered a wheelchair for home and it is pending delivery per [place]. No other DME needs noted at this time.  Discussed HH needs - choice offered for [facility] agency with list provided Per CMS guidelines from medicare.gov website with star ratings (copy placed in shadow chart)- per son they have used [facility] in past would like to use someone else this time and have chosen to go with [facility].  Son is expressing concern about getting patient home and up the 5 steps into the house. Pt voices that she thinks she is strong enough to do the steps. Discussed with them options for assistance such as calling other son to come help, also if pt gets home and can not do the steps calling fire department for help, or we can use [facility] door to door service to assist pt in getting home. Pt voicing that she and her son [name] can do it. Son remains unsure.  This writer expressed to them to think/talk about it and let the bedside RN know if they would like to use [facility] to go home, otherwise son to transport home.   Call made to [facility] with [facility] for HHRN/PT referral- referral has been accepted. They will contact pt within 48 hr post discharge for start of care.   Unit will assist with transportation should pt/son decide they want to use  [facility] to assist with getting pt home.   MD notified for [facility] orders.    Final next level of care: [place] Barriers to Discharge: Barriers Resolved   Patient Goals and CMS Choice Patient states their goals for this hospitalization and ongoing recovery are:: return home- "I want to go home" CMS Medicare.gov Compare Post Acute Care list provided to:: Patient Choice offered to / list presented to : [place]  Discharge Placement                 Home with [facility]      Discharge Plan and Services In-house Referral: Clinical Social Work Discharge Planning Services: CM Consult Post Acute Care Choice: Home Health          DME Arranged: N/A DME Agency: NA       HH Arranged: RN,Disease Management,PT [place] Agency: [place] Date [facility] Agency Contacted: 09/01/20 Time HH Agency Contacted: 1200 Representative spoke with at [place]: [place] ([place]) Interventions     Readmission Risk Interventions Readmission Risk Prevention Plan 09/01/2020  Transportation Screening Complete  PCP or Specialist Appt within 3-5 Days Complete  HRI or [place] Complete  Social Work Consult for [place] Planning/Counseling Complete  Palliative Care Screening Not Applicable  Medication Review Press photographer) Complete  Some recent  data might be hidden

## 2020-09-03 ENCOUNTER — Ambulatory Visit (INDEPENDENT_AMBULATORY_CARE_PROVIDER_SITE_OTHER): Payer: Medicare HMO | Admitting: Cardiovascular Disease

## 2020-09-03 ENCOUNTER — Other Ambulatory Visit: Payer: Self-pay

## 2020-09-03 ENCOUNTER — Other Ambulatory Visit: Payer: Self-pay | Admitting: Family Medicine

## 2020-09-03 ENCOUNTER — Telehealth: Payer: Self-pay

## 2020-09-03 ENCOUNTER — Encounter: Payer: Self-pay | Admitting: Cardiovascular Disease

## 2020-09-03 VITALS — BP 128/60 | HR 72 | Ht 65.0 in | Wt 177.2 lb

## 2020-09-03 DIAGNOSIS — E1169 Type 2 diabetes mellitus with other specified complication: Secondary | ICD-10-CM | POA: Diagnosis not present

## 2020-09-03 DIAGNOSIS — E785 Hyperlipidemia, unspecified: Secondary | ICD-10-CM | POA: Diagnosis not present

## 2020-09-03 DIAGNOSIS — I110 Hypertensive heart disease with heart failure: Secondary | ICD-10-CM | POA: Diagnosis not present

## 2020-09-03 DIAGNOSIS — J449 Chronic obstructive pulmonary disease, unspecified: Secondary | ICD-10-CM | POA: Diagnosis not present

## 2020-09-03 DIAGNOSIS — I2510 Atherosclerotic heart disease of native coronary artery without angina pectoris: Secondary | ICD-10-CM | POA: Diagnosis not present

## 2020-09-03 DIAGNOSIS — G8929 Other chronic pain: Secondary | ICD-10-CM | POA: Diagnosis not present

## 2020-09-03 DIAGNOSIS — G9341 Metabolic encephalopathy: Secondary | ICD-10-CM | POA: Diagnosis not present

## 2020-09-03 DIAGNOSIS — I10 Essential (primary) hypertension: Secondary | ICD-10-CM | POA: Diagnosis not present

## 2020-09-03 DIAGNOSIS — I5032 Chronic diastolic (congestive) heart failure: Secondary | ICD-10-CM | POA: Diagnosis not present

## 2020-09-03 DIAGNOSIS — M545 Low back pain, unspecified: Secondary | ICD-10-CM | POA: Diagnosis not present

## 2020-09-03 MED ORDER — ICOSAPENT ETHYL 1 G PO CAPS: 2.0000 g | ORAL_CAPSULE | Freq: Two times a day (BID) | ORAL | 5 refills | Status: DC

## 2020-09-03 MED ORDER — VASCEPA 1 G PO CAPS: 2.0000 g | ORAL_CAPSULE | Freq: Two times a day (BID) | ORAL | 3 refills | Status: DC

## 2020-09-03 MED ORDER — CARVEDILOL 12.5 MG PO TABS: 12.5000 mg | ORAL_TABLET | Freq: Two times a day (BID) | ORAL | 3 refills | Status: DC

## 2020-09-03 NOTE — Patient Outreach (Signed)
[name] Care Management  09/03/2020  Michelle [name] 1933/05/11 [id number]     Transition of Care Referral  Referral Date:09/01/2020  Referral Source: [facility] Liaison Referral Reason: "recommended SNF but no beds available due to pt's agitation" Date of Discharge: 09/01/2020 Facility:[facility] Insurance: [employer] Medicare PCP Does [facility]    Outreach attempt # 1 to patient/caregiver. Spoke with son. He reported that they have been busy caring for patient as she has been requiring a lot of care. Son voices some frustrations with patient not being eligible for SNF. RN CM attempted to provide support and assess the situation and offer solutions. However, son was not interested in discussing and guarded with his responses. He voiced that [facility] PT was coming out later today. He did not wish to continue call.      Plan: RN CM will close case at this time.    [name], RN,BSN,CCM [place] [facility] Telephonic Care Management Coordinator Direct Phone: [phone] Toll Free: [phone] Fax: [phone]

## 2020-09-03 NOTE — Telephone Encounter (Signed)
Thanks for calling-can you possibly reach her before our visit tomorrow to complete the TCM call?

## 2020-09-03 NOTE — Progress Notes (Signed)
Phone [phone]   Subjective:  Michelle [name] is a 87 y.o. year old very pleasant female patient who presents for transitional care management and hospital follow up for Altered Mental Status/delirium likely related to acute hypertensive encephalopathy. Patient was hospitalized from 08/12/20 to 09/01/20. A TCM phone call was attempted on 09/03/20 - confirmed appointment with patient but was unable to go through all info per patient request- no repeat attempt made due to visit being today. Medical complexity  moderate   87 year old female with CAD, difficult to control hypertension, diabetes who presented with acute confusion/delirium potentially related to hypertensive encephalopathy.  In the emergency room patient was afebrile, reassuring head CT, labile blood pressure as high as 186/81  In regards to delirium-as noted above reassuring head CT.  Some initial concern for seizures given "clenched teeth" initial presentation but later thought less likely  With reassuring EEG.  TSH, ammonia, RPR, B12 were normal.  Did have elevated lactate initially but is trended down.  She was noted to have waxing/waning encephalopathy/delirium but this improved with initiation of Seroquel.  Patient remains on Seroquel 50 mg.  See discussion below about hypertension/hypertensive encephalopathy.  Per hospitalist notes they wanted outpatient titration off of Seroquel 50 mg -she was discharged on 50 mg dose- no agitation at home.  She was continued on home Zoloft 100 mg -she has a hematoma on her left hand- she was in restraints and hitting side rail during hospitalization -tramadol was stopped due to confusion- previously was on 3 a day.  -some confusion at home but much improved- she is using voltaren gel but still in a lot of pain- wanting to at least try tramadol once a day   Resistant hypertension-patient was continued on amlodipine 10 mg, enalapril 20 mg twice daily, spironolactone.  Initially furosemide was  reduced due 20 mg due to to questionable dehydration and metoprolol was increased to50 mg twice daily.  Hydralazine was added 50 mg every 8 hours.  She was referred to the outpatient hypertension clinic with Dr. Hampton Abbot had a visit yesterday-that note is still pending but it appears metoprolol was stopped and coreg 12.84m BID was started in its place -most home #s 120s but has had one as high as 170. Today was on low side- with some highs at home opted not to adjust medicine today  Diabetes-patient's blood sugars were largely controlled during hospitalization with sliding scale insulin   COPD-no active disease during hospitalization.  Patient was continued on home inhalers.  She is currently on Advair and Spiriva doing well  Chronic diastolic CHF-patient was maintained on spironolactone and reduced dose of Lasix  Saw PT yesterday and with some reduced sensation in heel- recommended podiatry- feet look good today other than onychomycosis. PT and nursing visits once a week- we will help with coordination.   Also yesterday had first episode of postmenopausal bleeding in years. Over 30 years she suspects. She is still wearing a pad today. She is on aspirin but no other blood thinners.   OA- tylenol has not been helpful- Dr. ROval Linseymentioned trying 10055m3x a day instead of 50034mID started after hospitalization. Trying to avoid tramadol if possible. Doing voltaren twice a day- tried a 3rd. Injections once and not very helpful.   I independently reviewed chest x-ray on 08/12/2020-only one view available and AP and also with slight rotation.  EKG leads noted.  Airway normal, no bony abnormalities, cardiac silhouette normal considering AP view, diaphragm normal, normal lung fields  See  problem oriented charting as well  Past Medical History-  Patient Active Problem List   Diagnosis Date Noted  . Delirium 06/25/2020    Priority: High  . Chronic diastolic CHF (congestive heart failure) ([place])  03/13/2014    Priority: High  . CAD- RCA PCI '90s, STEMI-RCA DES 02/18/14 03/05/2014    Priority: High  . Anemia, iron deficiency 10/23/2015    Priority: Medium  . Osteoarthritis, knee 05/13/2014    Priority: Medium  . Anxiety state 04/21/2014    Priority: Medium  . SOB (shortness of breath) 03/05/2014    Priority: Medium  . Essential hypertension 04/21/2006    Priority: Medium  . COPD (chronic obstructive pulmonary disease) ([place]) 04/21/2006    Priority: Medium  . Actinic keratosis 11/17/2017    Priority: Low  . CKD (chronic kidney disease), stage II 07/25/2014    Priority: Low  . Back pain, lumbosacral 10/10/2012    Priority: Low  . Acute metabolic encephalopathy 25/95/6387  . DM (diabetes mellitus), secondary, uncontrolled, w/neurologic complic ([place]) and long-term insulin use 08/19/2020  . Hypokalemia 08/19/2020  . Agitated 08/13/2020  . Lactic acidosis 08/12/2020  . Hyperlipidemia 08/12/2020    Medications- reviewed and updated  A medical reconciliation was performed comparing current medicines to hospital discharge medications. Current Outpatient Medications  Medication Sig Dispense Refill  . ACCU-CHEK FASTCLIX LANCETS MISC     . acetaminophen (TYLENOL) 500 MG tablet Take 1,000 mg by mouth See admin instructions. Takes 1 tablet (500 mg totally) by mouth in the morning; takes 1 tablet (500 mg) in the afternoon and bedtime as needed for pain    . albuterol (PROVENTIL) (2.5 MG/3ML) 0.083% nebulizer solution USE THREE MILLILITERS VIA NEBULIZATION BY MOUTH EVERY 6 HOURS AS NEEDED FOR WHEEZING OR SHORTNESS OF BREATH (Patient taking differently: Take 2.5 mg by nebulization every 6 (six) hours as needed for wheezing or shortness of breath.) 75 mL 3  . albuterol (VENTOLIN HFA) 108 (90 Base) MCG/ACT inhaler Inhale 1 puff into the lungs every 4 (four) hours as needed for wheezing or shortness of breath (RESCUE inhaler if advair not working). 18 g 5  . Alcohol Swabs (B-D SINGLE USE SWABS  REGULAR) PADS USE  FOR  TESTING THREE TIMES DAILY 300 each 1  . amLODipine (NORVASC) 10 MG tablet TAKE 1 TABLET EVERY DAY (Patient taking differently: Take 10 mg by mouth daily.) 90 tablet 3  . aspirin 81 MG tablet Take 81 mg by mouth daily.     . Blood Glucose Calibration (ACCU-CHEK SMARTVIEW CONTROL) LIQD     . blood glucose meter kit and supplies KIT Dispense based on patient and insurance preference. Use up to four times daily as directed. (FOR ICD-9 250.00, 250.01). 1 each 0  . calcium carbonate (OSCAL) 1500 (600 Ca) MG TABS tablet Take 600 mg of elemental calcium by mouth daily with breakfast.    . carvedilol (COREG) 12.5 MG tablet Take 1 tablet (12.5 mg total) by mouth 2 (two) times daily. 60 tablet 3  . cholecalciferol (VITAMIN D3) 25 MCG (1000 UNIT) tablet Take 1,000 Units by mouth daily.    . clopidogrel (PLAVIX) 75 MG tablet TAKE ONE TABLET BY MOUTH DAILY **PLEASE KEEP UPCOMING APPOINTMENT IN NOVEMBER WITH DOCTOR [facility] FOR FUTURE REFILLS 90 tablet 1  . diclofenac Sodium (VOLTAREN) 1 % GEL Apply 2 g topically 4 (four) times daily. 350 g 3  . DROPLET PEN NEEDLES 31G X 8 MM MISC USE AS DIRECTED WITH LANTUS SOLOSTAR 300 each 1  .  enalapril (VASOTEC) 20 MG tablet TAKE 1 TABLET TWICE DAILY (Patient taking differently: Take 20 mg by mouth 2 (two) times daily.) 180 tablet 3  . fenofibrate micronized (LOFIBRA) 134 MG capsule Take 1 capsule (134 mg total) by mouth daily. 90 capsule 3  . ferrous sulfate 325 (65 FE) MG tablet Twice a week (Patient taking differently: Take 325 mg by mouth See admin instructions. Takes 1 tablet (325 mg totally) by mouth every Mon and Friday) 18 tablet 3  . Fluticasone-Salmeterol (ADVAIR) 250-50 MCG/DOSE AEPB INHALE ONE PUFF BY MOUTH TWICE DAILY (Patient taking differently: Inhale 1 puff into the lungs in the morning and at bedtime.) 60 each 5  . folic acid (FOLVITE) 573 MCG tablet Take 400 mcg by mouth daily.    . furosemide (LASIX) 20 MG tablet Take 1 tablet (20 mg  total) by mouth daily. 30 tablet 3  . glucose blood (ACCU-CHEK SMARTVIEW) test strip TEST BLOOD SUGAR THREE TIMES DAILY 300 strip 1  . hydrALAZINE (APRESOLINE) 50 MG tablet Take 1 tablet (50 mg total) by mouth every 8 (eight) hours. 90 tablet 3  . icosapent Ethyl (VASCEPA) 1 g capsule Take 2 capsules (2 g total) by mouth 2 (two) times daily. 360 capsule 3  . insulin glargine (LANTUS SOLOSTAR) 100 UNIT/ML Solostar Pen INJECT  17  TO  20 UNITS SUBCUTANEOUSLY EVERY MORNING (Patient taking differently: Inject 18 Units into the skin daily.) 90 mL 3  . Melatonin 5 MG CAPS Take 1 capsule by mouth at bedtime.     . metFORMIN (GLUCOPHAGE) 1000 MG tablet TAKE 1 TABLET TWICE DAILY (Patient taking differently: Take 1,000 mg by mouth 2 (two) times daily with a meal.) 180 tablet 1  . nitroGLYCERIN (NITROSTAT) 0.4 MG SL tablet PLACE 1 TABLET (0.4 MG TOTAL) UNDER THE TONGUE EVERY 5 (FIVE) MINUTESAS NEEDED FOR CHEST PAIN (CHEST PAIN OR SHORTNESS OF BREATH). (Patient taking differently: Place 0.4 mg under the tongue every 5 (five) minutes as needed for chest pain (max 3 doses).) 25 tablet 6  . pantoprazole (PROTONIX) 40 MG tablet Take 1 tablet (40 mg total) by mouth daily. 90 tablet 1  . QUEtiapine (SEROQUEL) 25 MG tablet Take 2 tablets (50 mg total) by mouth at bedtime. 30 tablet 0  . rosuvastatin (CRESTOR) 40 MG tablet TAKE 1 TABLET EVERY DAY (Patient taking differently: Take 40 mg by mouth daily.) 90 tablet 2  . sertraline (ZOLOFT) 50 MG tablet Take 2 tablets (100 mg total) by mouth at bedtime. (Patient taking differently: Take 100 mg by mouth daily.) 60 tablet 0  . SPIRIVA HANDIHALER 18 MCG inhalation capsule PLACE 1 CAPSULE INTO HANDIHALER AND INHALE CONTENTS OF 1 CAPSULE EVERY DAY AS DIRECTED AT 2 PM (Patient taking differently: Place 18 mcg into inhaler and inhale daily.) 90 capsule 1  . vitamin B-12 (CYANOCOBALAMIN) 1000 MCG tablet Take 1,000 mcg by mouth daily.    . vitamin E 400 UNIT capsule Take 400 Units by  mouth at bedtime.    Marland Kitchen spironolactone (ALDACTONE) 25 MG tablet Take 1 tablet (25 mg total) by mouth daily. 90 tablet 3   No current facility-administered medications for this visit.   Objective  Objective:  BP (!) 100/58   Pulse 78   Temp 98 F (36.7 C) (Temporal)   Ht '5\' 5"'  (1.651 m)   Wt 176 lb 12.8 oz (80.2 kg)   SpO2 96%   BMI 29.42 kg/m  Gen: NAD, resting comfortably CV: RRR no murmurs rubs or gallops Lungs:  CTAB no crackles, wheeze, rhonchi Abdomen: soft/nontender/nondistended/normal bowel sounds. No rebound or guarding.  Ext: no edema Skin: warm, dry, right hand raised lesion about 2 cm and extending most of back of hand suspicious for hematoma Neuro: wheelchair bound    Assessment and Plan:   TCM/hospital follow up   # Delirium  -Potentially related to hypertensive encephalopathy-much improved but still with some intermittent confusion though much improved overall -Work-up included negative head CT, EEG for possible seizure, TSH, ammonia, RPR, B12.  Initial lactate was elevated but trended down. -Patient with agitation related to this and was on Seroquel 50 mg during hospitalization-no agitation at home and we have begun weaning process-25 mg for a week and half tablet for a week then stop -Did develop significant hematoma on right hand while wearing restraints in the hospital-return precautions were given but also discussed may take some time perhaps up to 2 months for this to resolve -Follow-up in 2 weeks to check on her -See OA section but will try to remain off of tramadol.  She is still having some confusion at home though much less significant-I would like to give her some more time and see if she improves before possibly restarting back once a day instead of 3 times a day -For baseline depression/anxiety she can continue Zoloft 100 mg.  #Resistant hypertension -Patient was variability on home blood pressures but typically 120s.  Has seen as high as 170 since  being home. -Blood pressure is low today.  With prior highs and with home readings in the 120s for the most part did not reduce the medicine -I am concerned about vaginal bleeding she reports and potential for hypotension-see postmenopausal bleeding discussion below  -For now continue carvedilol 12.5 mg twice daily (switch from metoprolol 50 mg twice daily by cardiology), hydralazine 50 mg every 8 hours, amlodipine 10 mg, enalapril 20 mg twice daily, spironolactone 25 mg, Lasix 20 mg -Dr. Oval Linsey wants a cortisol level check-this was ordered today  #Postmenopausal bleeding  -We will get CBC and discussed likely will need repeat next week.  - Also urgent referral to gynecology  #Osteoarthritis of the knees -Hospitalist advised Tylenol 500 mg 3 times a day-Dr. Oval Linsey advised 1000 mg 3 times a day-I think is reasonable as long as we watch LFTs -Trying to avoid tramadol due to delirium issues -Has been on Voltaren gel 2-3 times a day-we will increase to 4 times a day -Did not have significant response to injections in the past but that has been sometime and not ultrasound-guided-we discussed referral to sports medicine orthopedics that she would like to see Raliegh Ip and referral was placed today  #Diabetes-has been well controlled-continue Lantus 18 units daily.  Lab Results  Component Value Date   HGBA1C 6.5 (H) 08/26/2020   HGBA1C 6.6 (H) 06/25/2020   HGBA1C 6.6 (H) 02/18/2020   #COPD-continue Advair and Spiriva-appears stable   #Chronic diastolic CHF-appears well controlled despite amlodipine.  Compliant with spironolactone and Lasix 20 mg-monitor fluid status at follow-up with lower dose of Lasix  #Elevated PHQ-9 noted-patient states a lot of this is related to recent hospitalization-we will continue to monitor this  Recommended follow up: 2 week follow up Future Appointments  Date Time Provider [place]  09/10/2020 11:00 AM Marcial Pacas, MD [facility] None  10/01/2020   2:00 PM [facility] PHARMACIST [facility]  06/07/2021  2:30 PM [facility] HEALTH COACH [facility]    Lab/Order associations:   ICD-10-CM   1. Essential hypertension  I10 Cortisol    CBC with Differential/Platelet    Comprehensive metabolic panel    Amb Referral to Palliative Care  2. Postmenopausal bleeding  N95.0 Ambulatory referral to Gynecology  3. Primary osteoarthritis of both knees  M17.0 Ambulatory referral to Orthopedic Surgery  4. History of delirium  Z87.898 Amb Referral to Palliative Care  5. Chronic diastolic CHF (congestive heart failure) (HCC)  I50.32 Amb Referral to Palliative Care    Meds ordered this encounter  Medications  . diclofenac Sodium (VOLTAREN) 1 % GEL    Sig: Apply 2 g topically 4 (four) times daily.    Dispense:  350 g    Refill:  3  . spironolactone (ALDACTONE) 25 MG tablet    Sig: Take 1 tablet (25 mg total) by mouth daily.    Dispense:  90 tablet    Refill:  3    Return precautions advised.  [name], MD

## 2020-09-03 NOTE — Patient Instructions (Addendum)
Medication Instructions:  STOP FISH OIL  START VASCEPA 2 GRAMS TWICE A DAY   STOP METOPROLOL   START CARVEDILOL 12.5 MG TWICE A DAY    Labwork: CORTISOL LEVEL TOMORROW AT DR HUNTER'S OFFICE   Testing/Procedures: WILL HAVE SOMEONE REACH OUT TO YOU REGARDING HOME SLEEP STUDY    Follow-Up: 10/01/2020 AT 2:00 PM WITH PHARM D    Special Instructions:   MONITOR YOUR BLOOD PRESSURE TWICE A DAY, LOG IN THE BOOK PROVIDED. BRING THE BOOK AND YOUR BLOOD PRESSURE MACHINE TO YOUR FOLLOW UP IN 1 MONTH   DECREASE YOUR CAFFEINE INTAKE   DASH Eating Plan DASH stands for "Dietary Approaches to Stop Hypertension." The DASH eating plan is a healthy eating plan that has been shown to reduce high blood pressure (hypertension). It may also reduce your risk for type 2 diabetes, heart disease, and stroke. The DASH eating plan may also help with weight loss. What are tips for following this plan?  General guidelines  Avoid eating more than 2,300 mg (milligrams) of salt (sodium) a day. If you have hypertension, you may need to reduce your sodium intake to 1,500 mg a day.  Limit alcohol intake to no more than 1 drink a day for nonpregnant women and 2 drinks a day for men. One drink equals 12 oz of beer, 5 oz of wine, or 1 oz of hard liquor.  Work with your health care provider to maintain a healthy body weight or to lose weight. Ask what an ideal weight is for you.  Get at least 30 minutes of exercise that causes your heart to beat faster (aerobic exercise) most days of the week. Activities may include walking, swimming, or biking.  Work with your health care provider or diet and nutrition specialist (dietitian) to adjust your eating plan to your individual calorie needs. Reading food labels   Check food labels for the amount of sodium per serving. Choose foods with less than 5 percent of the Daily Value of sodium. Generally, foods with less than 300 mg of sodium per serving fit into this eating  plan.  To find whole grains, look for the word "whole" as the first word in the ingredient list. Shopping  Buy products labeled as "low-sodium" or "no salt added."  Buy fresh foods. Avoid canned foods and premade or frozen meals. Cooking  Avoid adding salt when cooking. Use salt-free seasonings or herbs instead of table salt or sea salt. Check with your health care provider or pharmacist before using salt substitutes.  Do not fry foods. Cook foods using healthy methods such as baking, boiling, grilling, and broiling instead.  Cook with heart-healthy oils, such as olive, canola, soybean, or sunflower oil. Meal planning  Eat a balanced diet that includes: ? 5 or more servings of fruits and vegetables each day. At each meal, try to fill half of your plate with fruits and vegetables. ? Up to 6-8 servings of whole grains each day. ? Less than 6 oz of lean meat, poultry, or fish each day. A 3-oz serving of meat is about the same size as a deck of cards. One egg equals 1 oz. ? 2 servings of low-fat dairy each day. ? A serving of nuts, seeds, or beans 5 times each week. ? Heart-healthy fats. Healthy fats called Omega-3 fatty acids are found in foods such as flaxseeds and coldwater fish, like sardines, salmon, and mackerel.  Limit how much you eat of the following: ? Canned or prepackaged foods. ? Food  that is high in trans fat, such as fried foods. ? Food that is high in saturated fat, such as fatty meat. ? Sweets, desserts, sugary drinks, and other foods with added sugar. ? Full-fat dairy products.  Do not salt foods before eating.  Try to eat at least 2 vegetarian meals each week.  Eat more home-cooked food and less restaurant, buffet, and fast food.  When eating at a restaurant, ask that your food be prepared with less salt or no salt, if possible. What foods are recommended? The items listed may not be a complete list. Talk with your dietitian about what dietary choices are best  for you. Grains Whole-grain or whole-wheat bread. Whole-grain or whole-wheat pasta. Brown rice. [place]row. Bulgur. Whole-grain and low-sodium cereals. Pita bread. Low-fat, low-sodium crackers. Whole-wheat flour tortillas. Vegetables Fresh or frozen vegetables (raw, steamed, roasted, or grilled). Low-sodium or reduced-sodium tomato and vegetable juice. Low-sodium or reduced-sodium tomato sauce and tomato paste. Low-sodium or reduced-sodium canned vegetables. Fruits All fresh, dried, or frozen fruit. Canned fruit in natural juice (without added sugar). Meat and other protein foods Skinless chicken or [place]. Ground chicken or [place]. Pork with fat trimmed off. Fish and seafood. Egg whites. Dried beans, peas, or lentils. Unsalted nuts, nut butters, and seeds. Unsalted canned beans. Lean cuts of beef with fat trimmed off. Low-sodium, lean deli meat. Dairy Low-fat (1%) or fat-free (skim) milk. Fat-free, low-fat, or reduced-fat cheeses. Nonfat, low-sodium ricotta or cottage cheese. Low-fat or nonfat yogurt. Low-fat, low-sodium cheese. Fats and oils Soft margarine without trans fats. Vegetable oil. Low-fat, reduced-fat, or light mayonnaise and salad dressings (reduced-sodium). Canola, safflower, olive, soybean, and sunflower oils. Avocado. Seasoning and other foods Herbs. Spices. Seasoning mixes without salt. Unsalted popcorn and pretzels. Fat-free sweets. What foods are not recommended? The items listed may not be a complete list. Talk with your dietitian about what dietary choices are best for you. Grains Baked goods made with fat, such as croissants, muffins, or some breads. Dry pasta or rice meal packs. Vegetables Creamed or fried vegetables. Vegetables in a cheese sauce. Regular canned vegetables (not low-sodium or reduced-sodium). Regular canned tomato sauce and paste (not low-sodium or reduced-sodium). Regular tomato and vegetable juice (not low-sodium or reduced-sodium). Angie Fava.  Olives. Fruits Canned fruit in a light or heavy syrup. Fried fruit. Fruit in cream or butter sauce. Meat and other protein foods Fatty cuts of meat. Ribs. Fried meat. Berniece Salines. Sausage. Bologna and other processed lunch meats. Salami. Fatback. Hotdogs. Bratwurst. Salted nuts and seeds. Canned beans with added salt. Canned or smoked fish. Whole eggs or egg yolks. Chicken or [place] with skin. Dairy Whole or 2% milk, cream, and half-and-half. Whole or full-fat cream cheese. Whole-fat or sweetened yogurt. Full-fat cheese. Nondairy creamers. Whipped toppings. Processed cheese and cheese spreads. Fats and oils Butter. Stick margarine. Lard. Shortening. Ghee. Bacon fat. Tropical oils, such as coconut, palm kernel, or palm oil. Seasoning and other foods Salted popcorn and pretzels. Onion salt, garlic salt, seasoned salt, table salt, and sea salt. Worcestershire sauce. Tartar sauce. Barbecue sauce. Teriyaki sauce. Soy sauce, including reduced-sodium. Steak sauce. Canned and packaged gravies. Fish sauce. Oyster sauce. Cocktail sauce. Horseradish that you find on the shelf. Ketchup. Mustard. Meat flavorings and tenderizers. Bouillon cubes. Hot sauce and Tabasco sauce. Premade or packaged marinades. Premade or packaged taco seasonings. Relishes. Regular salad dressings. Where to find more information:  National Heart, Lung, and [place]: https://wilson-eaton.com/  American Heart Association: www.heart.org Summary  The DASH eating plan is a  healthy eating plan that has been shown to reduce high blood pressure (hypertension). It may also reduce your risk for type 2 diabetes, heart disease, and stroke.  With the DASH eating plan, you should limit salt (sodium) intake to 2,300 mg a day. If you have hypertension, you may need to reduce your sodium intake to 1,500 mg a day.  When on the DASH eating plan, aim to eat more fresh fruits and vegetables, whole grains, lean proteins, low-fat dairy, and heart-healthy  fats.  Work with your health care provider or diet and nutrition specialist (dietitian) to adjust your eating plan to your individual calorie needs. This information is not intended to replace advice given to you by your health care provider. Make sure you discuss any questions you have with your health care provider. Document Released: 06/23/2011 Document Revised: 06/16/2017 Document Reviewed: 06/27/2016 Elsevier Patient Education  2020 [employer].

## 2020-09-03 NOTE — Telephone Encounter (Cosign Needed)
Transition Care Management Unsuccessful Follow-up Telephone Call  Date of discharge and from where:  09/01/20 from [facility] [place]  Attempts:  1st Attempt  Reason for unsuccessful TCM follow-up call:  Unable to reach patient. Pt was at another Dr. appt at time of call. And is aware of HFU 09/04/20 @9  a.m

## 2020-09-03 NOTE — Progress Notes (Signed)
Hypertension Clinic Initial Assessment:    Date:  09/14/2020   ID:  Michelle [name], DOB 1933-03-19, MRN [id number]  PCP:  Michelle Olp, MD  Cardiologist:  Michelle Chandler, MD  Nephrologist:  Referring MD: Michelle Olp, MD   CC: Hypertension  History of Present Illness:    Michelle [name] is a 87 y.o. female with a hx of CAD status post angioplasty of the RCA in 1990 and inferior STEMI 02/2014, diabetes, hypertension, hyperlipidemia, prior tobacco abuse, and chronic diastolic heart failure here to establish care in the hypertension clinic. She was admitted for an inferior STEMI 02/2014 and had a completely occluded RCA with an aneurysm that formed prior to the angioplasty site the aneurysmal segment was treated with 2 covered stents.  She did have a small pericardial effusion post MI.  Follow-up echo revealed resolution of her pericardial effusion and normal systolic function.  She was last seen by [facility] cardiology 05/2019.  At that appointment her blood pressure was 138/62 and she was doing well.  At some point metoprolol was started instead of carvedilol 12.5mg .  [name] was admitted 08/2020 with confusion.  Her son found her in the bed unresponsive with her jaws clenched.  Her BP at presentation was 186/81.  She is thought to have hypertensive encephalopathy and delirium.  Head CT was unremarkable.  Her symtpoms improved with better BP control.  Her home amlodipine, enalapril, and spironolactone were continued.  Metoprolol was increased and hydralazine was added.  Her dose of furosemide was reduced due to concern for dehydration.  Of note, she had a similar episode in December.  Her son notes that her mentation has been much better since returning home.  Her BP has been much better controlled.  [name] is unable to exercise much because of her knees.  She uses resistance bands for exercise.  She denies any chest pain or palpitations.  She continues to smoke 3 to 4 cigarettes  daily.   Past Medical History:  Diagnosis Date  . Anginal pain ([place])   . Arthritis   . AV block, 1st degree   . CAD (coronary artery disease) 1990; 2015   Cardiac cath 1990 with Dr. Lia Foyer and [name] Residual CAD, non obstructive per 2015 cath. STEMI status post stent in August 2015.  Marland Kitchen COPD (chronic obstructive pulmonary disease) ([place])   . Diabetes mellitus type 2, insulin dependent ([place])   . Essential hypertension   . Hyperlipidemia with target LDL less than 70   . Myocardial infarction ([place])   . Pericarditis-post MI (short course of steroids) 03/06/2014  . 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 Shortness of breath     Past Surgical History:  Procedure Laterality Date  . CATARACT EXTRACTION  2020   right and left eye   . CHOLECYSTECTOMY    . CORONARY ANGIOPLASTY WITH STENT PLACEMENT  02/18/14   Promus DES to RCA  . LEFT HEART CATHETERIZATION WITH CORONARY ANGIOGRAM N/A 02/18/2014   Procedure: LEFT HEART CATHETERIZATION WITH CORONARY ANGIOGRAM;  Surgeon: Michelle Booze, MD;  Location: [facility] CATH LAB;  Service: Cardiovascular;  Laterality: N/A;  . PTCA  1990   PTCA of RCA  . TRANSTHORACIC ECHOCARDIOGRAM  02/02/2012   mild  LVH, EF 55-60%, Normal WM, Gr 1 DD; Mild MR    Current Medications: Current Meds  Medication Sig  . icosapent Ethyl (VASCEPA) 1 g capsule Take 2 capsules (2 g total) by mouth 2 (two) times daily.  . [DISCONTINUED] carvedilol (COREG) 12.5 MG tablet Take 1 tablet (12.5 mg total) by mouth 2 (two) times daily.  . [DISCONTINUED] icosapent Ethyl (VASCEPA) 1 g capsule Take 2 capsules (2 g total) by mouth 2 (two) times daily.     Allergies:   Codeine sulfate and Morphine sulfate   Social History   Socioeconomic History  . Marital  status: Married    Spouse name: Not on file  . Number of children: 2  . Years of education: Not on file  . Highest education level: Not on file  Occupational History  . Occupation: Retired    Fish farm manager: RETIRED  Tobacco Use  . Smoking status: Former Smoker    Packs/day: 1.00    Years: 55.00    Pack years: 55.00    Types: Cigarettes    Quit date: 09/29/2006    Years since quitting: 13.9  . Smokeless tobacco: Never Used  Vaping Use  . Vaping Use: Never used  Substance and Sexual Activity  . Alcohol use: No  . Drug use: No  . Sexual activity: Not Currently  Other Topics Concern  . Not on file  Social History Narrative   Lives with her son but he works all day. Home for dinner.  Independent ADL. Needs assist on some IADL.    Social Determinants of Health   Financial Resource Strain: Low Risk   . Difficulty of Paying Living Expenses: Not hard at all  Food Insecurity: No Food Insecurity  . Worried About Charity fundraiser in the Last Year: Never true  . Ran Out of Food in the Last Year: Never true  Transportation Needs: No Transportation Needs  . Lack of Transportation (Medical): No  . Lack of Transportation (Non-Medical): No  Physical Activity: Inactive  . Days of Exercise per Week: 0 days  . Minutes of Exercise per Session: 0 min  Stress: No Stress Concern Present  . Feeling of Stress : Not at all  Social Connections: Unknown  . Frequency of Communication with Friends and Family: Once a week  . Frequency of Social Gatherings with Friends and Family: Never  . Attends Religious Services: Never  . Active Member of Clubs or Organizations: No  . Attends Archivist Meetings: Never  . Marital Status: Not on file     Family History: The patient's family history includes Bone cancer in her brother; Cancer in her maternal grandfather, paternal grandfather, and paternal grandmother; Cancer (age of onset: 5) in her father; Diabetes in her son and son; Heart attack in  her son; Heart attack (age of onset: 74) in her mother; Hyperlipidemia in her son; Hypertension in her son; Liver cancer in her brother.  ROS:   Please see the history of present illness.    All other systems reviewed and are negative.  EKGs/Labs/Other Studies Reviewed:    EKG:  EKG is not ordered today.  The ekg ordered 08/27/20 demonstrates Sinus rhythm.  Rate 73 bpm.  First-degree block.  Echo 02/2014: Study Conclusions   - Left ventricle: The cavity size was normal. Systolic function was  normal. The estimated ejection fraction was in the range of 60%  to 65%. Wall motion was normal; there were no regional wall  motion abnormalities.  -  Aortic valve: There was mild to moderate regurgitation.  - Mitral valve: There was mild regurgitation.  - Left atrium: The atrium was mildly dilated.  - Pericardium, extracardiac: A trivial pericardial effusion was  identified posterior to the heart.    Recent Labs: 08/12/2020: TSH 0.820 08/31/2020: Magnesium 1.7 09/04/2020: ALT 16; BUN 29; Creatinine, Ser 0.93; Hemoglobin 12.4; Platelets 249.0; Potassium 4.1; Sodium 137   Recent Lipid Panel    Component Value Date/Time   CHOL 128 02/18/2020 1529   TRIG 355 (H) 02/18/2020 1529   TRIG 1693 (HH) 07/28/2006 1202   HDL 49 (L) 02/18/2020 1529   CHOLHDL 2.6 02/18/2020 1529   VLDL UNABLE TO CALCULATE IF TRIGLYCERIDE OVER 400 mg/dL 02/19/2014 0228   LDLCALC 42 02/18/2020 1529   LDLDIRECT 40.0 07/23/2019 1445    Physical Exam:    VS:  BP 128/60 (BP Location: Right Arm, Patient Position: Sitting, Cuff Size: Normal)   Pulse 72   Ht 5\' 5"  (1.651 m)   Wt 177 lb 3.2 oz (80.4 kg)   SpO2 98%   BMI 29.49 kg/m  , BMI Body mass index is 29.49 kg/m. GENERAL:  Well appearing HEENT: Pupils equal round and reactive, fundi not visualized, oral mucosa unremarkable.  Cushingoid facies NECK:  No jugular venous distention, waveform within normal limits, carotid upstroke brisk and symmetric, no  bruits LUNGS:  Clear to auscultation bilaterally HEART:  RRR.  PMI not displaced or sustained,S1 and S2 within normal limits, no S3, no S4, no clicks, no rubs, no murmurs ABD:  Flat, positive bowel sounds normal in frequency in pitch, no bruits, no rebound, no guarding, no midline pulsatile mass, no hepatomegaly, no splenomegaly EXT:  2 plus pulses throughout, no edema, no cyanosis no clubbing SKIN:  No rashes no nodules NEURO:  Cranial nerves II through XII grossly intact, motor grossly intact throughout PSYCH:  Cognitively intact, oriented to person place and time  ASSESSMENT:    1. Essential hypertension     PLAN:    # Hypertension:  BP better controlled today.  Has still been high some at home.  We will stop metoprolol and switch to carvedilol 12.5 mg twice daily.  She also has a Cushingoid appearance.  Check AM cortisol with Dr. Yong Channel in the AM when she goes for other lab work.  She has snoring and somnolence.  We will check a home sleep study.  Advised her to check her blood pressures twice daily and bring to follow-up.  She was provided with an hypertension educational handbook with pages in the back for checking her blood pressure.  She has been drinking more coffee lately and will try to cut back on that.  Continue amlodipine, enalapril, hydralazine, and spironolactone.2  Secondary Causes of Hypertension  Medications/Herbal: OCP, steroids, stimulants, antidepressants, weight loss medication, immune suppressants, NSAIDs, sympathomimetics, alcohol, caffeine, licorice, ginseng, St. John's wort, chemo  Sleep Apnea: Order sleep study Renal artery stenosis: Renal Doppler normal 07/2020 Hyperaldosteronism: Normal 08/2020 Hyper/hypothyroidism: Normal 07/2020 Pheochromocytoma:(testing not indicated)  Cushing's syndrome: Check AM cortisol Coarctation of the aorta: BP symmetric   # CAD s/p STEMI:  # Hyperlipidemia:  No angina.  Doing well.  Triglycerides are elevated.  We will stop fish  oil and switch to Vascepa 2 g twice daily.  Continue aspirin, clopidogrel, carvedilol, fenofibrate, and rosuvastatin.  Disposition:    FU with MD/PharmD in 1 month   Time spent: 45 minutes-Greater than 50% of this time was spent in counseling, explanation of diagnosis, planning of  further management, and coordination of care.  Medication Adjustments/Labs and Tests Ordered: Current medicines are reviewed at length with the patient today.  Concerns regarding medicines are outlined above.  Orders Placed This Encounter  Procedures  . Cortisol   Meds ordered this encounter  Medications  . DISCONTD: carvedilol (COREG) 12.5 MG tablet    Sig: Take 1 tablet (12.5 mg total) by mouth 2 (two) times daily.    Dispense:  180 tablet    Refill:  3    D/C METOPROLOL  . DISCONTD: icosapent Ethyl (VASCEPA) 1 g capsule    Sig: Take 2 capsules (2 g total) by mouth 2 (two) times daily.    Dispense:  120 capsule    Refill:  5  . icosapent Ethyl (VASCEPA) 1 g capsule    Sig: Take 2 capsules (2 g total) by mouth 2 (two) times daily.    Dispense:  360 capsule    Refill:  3  . carvedilol (COREG) 12.5 MG tablet    Sig: Take 1 tablet (12.5 mg total) by mouth 2 (two) times daily.    Dispense:  60 tablet    Refill:  3    D/C METOPROLOL     Signed, Skeet Latch, MD  09/14/2020 11:13 AM    [place]

## 2020-09-03 NOTE — Patient Instructions (Addendum)
Reduce seroquel to 25mg  a night for the next week. Then half tablet for a week then stop. If agitation worsens let me know before reducing further.   We will call you within two weeks about your referral to sports medicine, gynecology, and palliative care. If you do not hear within 2 weeks, give [place] a call. Team I placed GYN visit as urgent  If large amount of menstrual flow - may need to see you sooner -depending on bloodwork good chance we recheck next week  Please stop by lab before you go If you have mychart- we will send your results within 3 business days of [place] receiving them.  If you do not have mychart- we will call you about results within 5 business days of [place] receiving them.  *please also note that you will see labs on mychart as soon as they post. I will later go in and write notes on them- will say "notes from Dr. Yong Channel"   Recommended follow up: 2 week follow up (can use same day slot if needed for front desk)

## 2020-09-04 ENCOUNTER — Other Ambulatory Visit: Payer: Self-pay | Admitting: Cardiovascular Disease

## 2020-09-04 ENCOUNTER — Encounter: Payer: Self-pay | Admitting: Family Medicine

## 2020-09-04 ENCOUNTER — Telehealth: Payer: Self-pay | Admitting: Cardiovascular Disease

## 2020-09-04 ENCOUNTER — Ambulatory Visit (INDEPENDENT_AMBULATORY_CARE_PROVIDER_SITE_OTHER): Payer: Medicare HMO | Admitting: Family Medicine

## 2020-09-04 ENCOUNTER — Telehealth: Payer: Self-pay | Admitting: *Deleted

## 2020-09-04 VITALS — BP 100/58 | HR 78 | Temp 98.0°F | Ht 65.0 in | Wt 176.8 lb

## 2020-09-04 DIAGNOSIS — M170 Bilateral primary osteoarthritis of knee: Secondary | ICD-10-CM

## 2020-09-04 DIAGNOSIS — I10 Essential (primary) hypertension: Secondary | ICD-10-CM | POA: Diagnosis not present

## 2020-09-04 DIAGNOSIS — N950 Postmenopausal bleeding: Secondary | ICD-10-CM | POA: Diagnosis not present

## 2020-09-04 DIAGNOSIS — J449 Chronic obstructive pulmonary disease, unspecified: Secondary | ICD-10-CM

## 2020-09-04 DIAGNOSIS — I5032 Chronic diastolic (congestive) heart failure: Secondary | ICD-10-CM

## 2020-09-04 DIAGNOSIS — R0602 Shortness of breath: Secondary | ICD-10-CM

## 2020-09-04 DIAGNOSIS — Z87898 Personal history of other specified conditions: Secondary | ICD-10-CM

## 2020-09-04 LAB — CBC WITH DIFFERENTIAL/PLATELET
Basophils Absolute: 0.1 10*3/uL (ref 0.0–0.1)
Basophils Relative: 0.9 % (ref 0.0–3.0)
Eosinophils Absolute: 0.2 10*3/uL (ref 0.0–0.7)
Eosinophils Relative: 2 % (ref 0.0–5.0)
HCT: 36.9 % (ref 36.0–46.0)
Hemoglobin: 12.4 g/dL (ref 12.0–15.0)
Lymphocytes Relative: 20 % (ref 12.0–46.0)
Lymphs Abs: 1.7 10*3/uL (ref 0.7–4.0)
MCHC: 33.5 g/dL (ref 30.0–36.0)
MCV: 89.2 fl (ref 78.0–100.0)
Monocytes Absolute: 0.7 10*3/uL (ref 0.1–1.0)
Monocytes Relative: 8.2 % (ref 3.0–12.0)
Neutro Abs: 5.8 10*3/uL (ref 1.4–7.7)
Neutrophils Relative %: 68.9 % (ref 43.0–77.0)
Platelets: 249 10*3/uL (ref 150.0–400.0)
RBC: 4.14 Mil/uL (ref 3.87–5.11)
RDW: 13.6 % (ref 11.5–15.5)
WBC: 8.4 10*3/uL (ref 4.0–10.5)

## 2020-09-04 LAB — COMPREHENSIVE METABOLIC PANEL
ALT: 16 U/L (ref 0–35)
AST: 16 U/L (ref 0–37)
Albumin: 3.8 g/dL (ref 3.5–5.2)
Alkaline Phosphatase: 44 U/L (ref 39–117)
BUN: 29 mg/dL — ABNORMAL HIGH (ref 6–23)
CO2: 24 mEq/L (ref 19–32)
Calcium: 10 mg/dL (ref 8.4–10.5)
Chloride: 104 mEq/L (ref 96–112)
Creatinine, Ser: 0.93 mg/dL (ref 0.40–1.20)
GFR: 55.98 mL/min — ABNORMAL LOW (ref >60.00)
Glucose, Bld: 141 mg/dL — ABNORMAL HIGH (ref 70–99)
Potassium: 4.1 mEq/L (ref 3.5–5.1)
Sodium: 137 mEq/L (ref 135–145)
Total Bilirubin: 0.3 mg/dL (ref 0.2–1.2)
Total Protein: 6.7 g/dL (ref 6.0–8.3)

## 2020-09-04 LAB — CORTISOL: Cortisol, Plasma: 12.7 ug/dL

## 2020-09-04 MED ORDER — DICLOFENAC SODIUM 1 % EX GEL: 2.0000 g | Freq: Four times a day (QID) | CUTANEOUS | 3 refills | Status: DC

## 2020-09-04 MED ORDER — SPIRONOLACTONE 25 MG PO TABS: 25.0000 mg | ORAL_TABLET | Freq: Every day | ORAL | 3 refills | Status: DC

## 2020-09-04 NOTE — Telephone Encounter (Signed)
Please advise? They will stop fish oil and start Vascepa when it arrives correct? Thanks!

## 2020-09-04 NOTE — Telephone Encounter (Signed)
    Pt c/o medication issue:  1. Name of Medication:   icosapent Ethyl (VASCEPA) 1 g capsule    2. How are you currently taking this medication (dosage and times per day)?   3. Are you having a reaction (difficulty breathing--STAT)?   4. What is your medication issue? [name] is the one preparing pt's meds and Dr. Oval Linsey replaced the pt's fish oil to Vascepa. Prescription was sent to [facility] mail delivery. He wanted to check in if pt need to continue taking fish oil and will replace it with vascepa once it arrives in the mail?

## 2020-09-04 NOTE — Telephone Encounter (Signed)
[name] notified of HST appointment details.

## 2020-09-04 NOTE — Telephone Encounter (Signed)
Please continue taking fish oil until Vascepa received, then stop taking fish oil and start Vascepa as prescribed.

## 2020-09-05 DIAGNOSIS — J449 Chronic obstructive pulmonary disease, unspecified: Secondary | ICD-10-CM | POA: Diagnosis not present

## 2020-09-05 DIAGNOSIS — E785 Hyperlipidemia, unspecified: Secondary | ICD-10-CM | POA: Diagnosis not present

## 2020-09-05 DIAGNOSIS — E1169 Type 2 diabetes mellitus with other specified complication: Secondary | ICD-10-CM | POA: Diagnosis not present

## 2020-09-05 DIAGNOSIS — G9341 Metabolic encephalopathy: Secondary | ICD-10-CM | POA: Diagnosis not present

## 2020-09-05 DIAGNOSIS — M545 Low back pain, unspecified: Secondary | ICD-10-CM | POA: Diagnosis not present

## 2020-09-05 DIAGNOSIS — I2510 Atherosclerotic heart disease of native coronary artery without angina pectoris: Secondary | ICD-10-CM | POA: Diagnosis not present

## 2020-09-05 DIAGNOSIS — I5032 Chronic diastolic (congestive) heart failure: Secondary | ICD-10-CM | POA: Diagnosis not present

## 2020-09-05 DIAGNOSIS — G8929 Other chronic pain: Secondary | ICD-10-CM | POA: Diagnosis not present

## 2020-09-05 DIAGNOSIS — I110 Hypertensive heart disease with heart failure: Secondary | ICD-10-CM | POA: Diagnosis not present

## 2020-09-07 ENCOUNTER — Telehealth: Payer: Self-pay

## 2020-09-07 DIAGNOSIS — I5032 Chronic diastolic (congestive) heart failure: Secondary | ICD-10-CM | POA: Diagnosis not present

## 2020-09-07 DIAGNOSIS — I110 Hypertensive heart disease with heart failure: Secondary | ICD-10-CM | POA: Diagnosis not present

## 2020-09-07 DIAGNOSIS — M545 Low back pain, unspecified: Secondary | ICD-10-CM | POA: Diagnosis not present

## 2020-09-07 DIAGNOSIS — E1169 Type 2 diabetes mellitus with other specified complication: Secondary | ICD-10-CM | POA: Diagnosis not present

## 2020-09-07 DIAGNOSIS — M25561 Pain in right knee: Secondary | ICD-10-CM | POA: Diagnosis not present

## 2020-09-07 DIAGNOSIS — M25562 Pain in left knee: Secondary | ICD-10-CM | POA: Diagnosis not present

## 2020-09-07 DIAGNOSIS — G9341 Metabolic encephalopathy: Secondary | ICD-10-CM | POA: Diagnosis not present

## 2020-09-07 DIAGNOSIS — E785 Hyperlipidemia, unspecified: Secondary | ICD-10-CM | POA: Diagnosis not present

## 2020-09-07 DIAGNOSIS — J449 Chronic obstructive pulmonary disease, unspecified: Secondary | ICD-10-CM | POA: Diagnosis not present

## 2020-09-07 DIAGNOSIS — I2510 Atherosclerotic heart disease of native coronary artery without angina pectoris: Secondary | ICD-10-CM | POA: Diagnosis not present

## 2020-09-07 DIAGNOSIS — G8929 Other chronic pain: Secondary | ICD-10-CM | POA: Diagnosis not present

## 2020-09-07 NOTE — Telephone Encounter (Signed)
[facility] home health nurse, Wyatt Portela, called to let Dr. Yong Channel know that pt is complaining of 10/10 pain in her knee. Pt does have an appt today with ortho.

## 2020-09-07 NOTE — Telephone Encounter (Signed)
FYI

## 2020-09-07 NOTE — Telephone Encounter (Signed)
Dionka from [facility] states patient had a falll on sat and ems had to be called to help patient up  [facility]

## 2020-09-07 NOTE — Telephone Encounter (Signed)
Called and lm on pt vm tcb. 

## 2020-09-07 NOTE — Telephone Encounter (Signed)
Please make sure no headaches or blurry vision or unilateral weakness-otherwise sounds like primarily orthopedic concerns-glad she sees orthopedics today

## 2020-09-09 ENCOUNTER — Encounter: Payer: Self-pay | Admitting: Neurology

## 2020-09-09 ENCOUNTER — Telehealth: Payer: Self-pay

## 2020-09-09 DIAGNOSIS — R410 Disorientation, unspecified: Secondary | ICD-10-CM

## 2020-09-09 NOTE — Telephone Encounter (Signed)
New referral placed.

## 2020-09-09 NOTE — Telephone Encounter (Signed)
Referral was placed for pt to go to neurologist when she was seen in ED in December. Pt had an appt at [facility] but was unable to make the appointment. Pt son called [facility] to reschedule appt and was told that he was unable to reschedule and the pt needed to go somewhere else. Son asked if Dr. Yong Channel would placed new referral to [facility]. Please advise.

## 2020-09-10 ENCOUNTER — Telehealth: Payer: Self-pay

## 2020-09-10 ENCOUNTER — Ambulatory Visit: Payer: Medicare HMO | Admitting: Neurology

## 2020-09-10 DIAGNOSIS — I5032 Chronic diastolic (congestive) heart failure: Secondary | ICD-10-CM | POA: Diagnosis not present

## 2020-09-10 DIAGNOSIS — M545 Low back pain, unspecified: Secondary | ICD-10-CM | POA: Diagnosis not present

## 2020-09-10 DIAGNOSIS — I110 Hypertensive heart disease with heart failure: Secondary | ICD-10-CM | POA: Diagnosis not present

## 2020-09-10 DIAGNOSIS — G8929 Other chronic pain: Secondary | ICD-10-CM | POA: Diagnosis not present

## 2020-09-10 DIAGNOSIS — G9341 Metabolic encephalopathy: Secondary | ICD-10-CM | POA: Diagnosis not present

## 2020-09-10 DIAGNOSIS — E1169 Type 2 diabetes mellitus with other specified complication: Secondary | ICD-10-CM | POA: Diagnosis not present

## 2020-09-10 DIAGNOSIS — E785 Hyperlipidemia, unspecified: Secondary | ICD-10-CM | POA: Diagnosis not present

## 2020-09-10 DIAGNOSIS — J449 Chronic obstructive pulmonary disease, unspecified: Secondary | ICD-10-CM | POA: Diagnosis not present

## 2020-09-10 DIAGNOSIS — I2510 Atherosclerotic heart disease of native coronary artery without angina pectoris: Secondary | ICD-10-CM | POA: Diagnosis not present

## 2020-09-10 NOTE — Telephone Encounter (Signed)
Dionna from [facility] called and relayed following message  Pt had fall on Monday. EMS had to come get pt up. Pt was checked out and did not go to hospital. Tuesday, pt received cortisone injections in both knees. Today, pt is complaining of 10/10 in both knees and stating she has no relief from the injections yet.    Dionaa [phone]   Requesting call back

## 2020-09-10 NOTE — Telephone Encounter (Signed)
Called and spoke with Dionna and gave below recommendation, she stated she just wanted Dr. Yong Channel to be aware.

## 2020-09-10 NOTE — Telephone Encounter (Signed)
FYI, anything in particular for pt for me to relay to [place]?

## 2020-09-10 NOTE — Telephone Encounter (Signed)
Injections can take several weeks to really kick in unfortunately - can try icing knees right now

## 2020-09-11 DIAGNOSIS — I5032 Chronic diastolic (congestive) heart failure: Secondary | ICD-10-CM | POA: Diagnosis not present

## 2020-09-11 DIAGNOSIS — G9341 Metabolic encephalopathy: Secondary | ICD-10-CM | POA: Diagnosis not present

## 2020-09-11 DIAGNOSIS — I2510 Atherosclerotic heart disease of native coronary artery without angina pectoris: Secondary | ICD-10-CM | POA: Diagnosis not present

## 2020-09-11 DIAGNOSIS — G8929 Other chronic pain: Secondary | ICD-10-CM | POA: Diagnosis not present

## 2020-09-11 DIAGNOSIS — M545 Low back pain, unspecified: Secondary | ICD-10-CM | POA: Diagnosis not present

## 2020-09-11 DIAGNOSIS — E1169 Type 2 diabetes mellitus with other specified complication: Secondary | ICD-10-CM | POA: Diagnosis not present

## 2020-09-11 DIAGNOSIS — I110 Hypertensive heart disease with heart failure: Secondary | ICD-10-CM | POA: Diagnosis not present

## 2020-09-11 DIAGNOSIS — J449 Chronic obstructive pulmonary disease, unspecified: Secondary | ICD-10-CM | POA: Diagnosis not present

## 2020-09-11 DIAGNOSIS — E785 Hyperlipidemia, unspecified: Secondary | ICD-10-CM | POA: Diagnosis not present

## 2020-09-14 ENCOUNTER — Encounter: Payer: Self-pay | Admitting: Cardiovascular Disease

## 2020-09-14 DIAGNOSIS — M545 Low back pain, unspecified: Secondary | ICD-10-CM | POA: Diagnosis not present

## 2020-09-14 DIAGNOSIS — E785 Hyperlipidemia, unspecified: Secondary | ICD-10-CM | POA: Diagnosis not present

## 2020-09-14 DIAGNOSIS — E1169 Type 2 diabetes mellitus with other specified complication: Secondary | ICD-10-CM | POA: Diagnosis not present

## 2020-09-14 DIAGNOSIS — I5032 Chronic diastolic (congestive) heart failure: Secondary | ICD-10-CM | POA: Diagnosis not present

## 2020-09-14 DIAGNOSIS — I110 Hypertensive heart disease with heart failure: Secondary | ICD-10-CM | POA: Diagnosis not present

## 2020-09-14 DIAGNOSIS — G9341 Metabolic encephalopathy: Secondary | ICD-10-CM | POA: Diagnosis not present

## 2020-09-14 DIAGNOSIS — G8929 Other chronic pain: Secondary | ICD-10-CM | POA: Diagnosis not present

## 2020-09-14 DIAGNOSIS — I2510 Atherosclerotic heart disease of native coronary artery without angina pectoris: Secondary | ICD-10-CM | POA: Diagnosis not present

## 2020-09-14 DIAGNOSIS — J449 Chronic obstructive pulmonary disease, unspecified: Secondary | ICD-10-CM | POA: Diagnosis not present

## 2020-09-15 ENCOUNTER — Other Ambulatory Visit: Payer: Self-pay

## 2020-09-15 ENCOUNTER — Ambulatory Visit: Payer: Medicare HMO | Admitting: Obstetrics & Gynecology

## 2020-09-15 ENCOUNTER — Encounter: Payer: Self-pay | Admitting: Obstetrics & Gynecology

## 2020-09-15 DIAGNOSIS — G9341 Metabolic encephalopathy: Secondary | ICD-10-CM | POA: Diagnosis not present

## 2020-09-15 DIAGNOSIS — E785 Hyperlipidemia, unspecified: Secondary | ICD-10-CM | POA: Diagnosis not present

## 2020-09-15 DIAGNOSIS — N950 Postmenopausal bleeding: Secondary | ICD-10-CM

## 2020-09-15 DIAGNOSIS — R8290 Unspecified abnormal findings in urine: Secondary | ICD-10-CM | POA: Diagnosis not present

## 2020-09-15 DIAGNOSIS — I110 Hypertensive heart disease with heart failure: Secondary | ICD-10-CM | POA: Diagnosis not present

## 2020-09-15 DIAGNOSIS — I5032 Chronic diastolic (congestive) heart failure: Secondary | ICD-10-CM | POA: Diagnosis not present

## 2020-09-15 DIAGNOSIS — J449 Chronic obstructive pulmonary disease, unspecified: Secondary | ICD-10-CM | POA: Diagnosis not present

## 2020-09-15 DIAGNOSIS — E1169 Type 2 diabetes mellitus with other specified complication: Secondary | ICD-10-CM | POA: Diagnosis not present

## 2020-09-15 DIAGNOSIS — I2510 Atherosclerotic heart disease of native coronary artery without angina pectoris: Secondary | ICD-10-CM | POA: Diagnosis not present

## 2020-09-15 DIAGNOSIS — M545 Low back pain, unspecified: Secondary | ICD-10-CM | POA: Diagnosis not present

## 2020-09-15 DIAGNOSIS — G8929 Other chronic pain: Secondary | ICD-10-CM | POA: Diagnosis not present

## 2020-09-15 MED ORDER — SULFAMETHOXAZOLE-TRIMETHOPRIM 800-160 MG PO TABS
1.0000 | ORAL_TABLET | Freq: Two times a day (BID) | ORAL | 0 refills | Status: AC
Start: 2020-09-15 — End: 2020-09-18

## 2020-09-15 NOTE — Progress Notes (Signed)
° ° °  Michelle [name] [birth year] [id number]        87 y.o.  G2P2L2  Accompanied by her son.  RP: Postmenopausal bleeding x 5 days 2 weeks ago  HPI: Admitted about 1 month ago with Acute metabolic encephalopathy.  About 2 weeks ago, had mild red bleeding staining her underwear/depends x 5 days.  No abdominopelvic pain or cramping. Nothing put in the vagina, no trauma. Never been on HRT.  No Anti-thrombotic meds, just on BB ASA.  No UTI Sx.  Mild diarrhea, no blood seen in stools.  No fever.   OB History  Gravida Para Term Preterm AB Living  2 2       2   SAB IAB Ectopic Multiple Live Births               # Outcome Date GA Lbr Len/2nd Weight Sex Delivery Anes PTL Lv  2 Para           1 Para             Past medical history,surgical history, problem list, medications, allergies, family history and social history were all reviewed and documented in the EPIC chart.   Directed ROS with pertinent positives and negatives documented in the history of present illness/assessment and plan.  Exam:  There were no vitals filed for this visit. General appearance:  Normal  Abdomen: Normal  Gynecologic exam: Vulva normal.  Speculum:  Cervix normal, vagina normal.  No abnormal discharge, no blood.  U/A: Yellow cloudy, protein negative, nitrites negative, white blood cells 10-20, red blood cells negative, many bacteria.  Urine culture pending.   Assessment/Plan:  87 y.o. G2P2   1. Postmenopausal bleeding Postmenopausal bleeding x 5 days 2 weeks ago.  Normal gynecologic exam in postmenopause.  Will complete the investigation with a pelvic US at f/u to r/o Endometrial pathology such as Polyp/Fibroid/Endometrial Hyperplasia and Endometrial Cancer.  Possible Endometrial biopsy per [place] results.  U/A is negative for RBC today, but shows a possible UTI. Patient and son voiced understanding and agreement with the plan. - US Transvaginal Non-OB; Future  2. Abnormal urinalysis Abnormal U/A in an  asymptomatic patient for UTI.  Will start ABTx if becomes Sxic or if U. Culture is positive.  Bactrim DS usage reviewed and prescription sent to pharmacy. - U/A  Other orders - sulfamethoxazole-trimethoprim (BACTRIM DS) 800-160 MG tablet; Take 1 tablet by mouth 2 (two) times daily for 3 days.  [name] MD, 11:32 AM 09/15/2020

## 2020-09-16 ENCOUNTER — Other Ambulatory Visit: Payer: Self-pay | Admitting: Family Medicine

## 2020-09-16 DIAGNOSIS — M545 Low back pain, unspecified: Secondary | ICD-10-CM | POA: Diagnosis not present

## 2020-09-16 DIAGNOSIS — E1169 Type 2 diabetes mellitus with other specified complication: Secondary | ICD-10-CM | POA: Diagnosis not present

## 2020-09-16 DIAGNOSIS — I110 Hypertensive heart disease with heart failure: Secondary | ICD-10-CM | POA: Diagnosis not present

## 2020-09-16 DIAGNOSIS — I5032 Chronic diastolic (congestive) heart failure: Secondary | ICD-10-CM | POA: Diagnosis not present

## 2020-09-16 DIAGNOSIS — G3184 Mild cognitive impairment, so stated: Secondary | ICD-10-CM | POA: Diagnosis not present

## 2020-09-16 DIAGNOSIS — G8929 Other chronic pain: Secondary | ICD-10-CM | POA: Diagnosis not present

## 2020-09-16 DIAGNOSIS — J449 Chronic obstructive pulmonary disease, unspecified: Secondary | ICD-10-CM | POA: Diagnosis not present

## 2020-09-16 DIAGNOSIS — E785 Hyperlipidemia, unspecified: Secondary | ICD-10-CM | POA: Diagnosis not present

## 2020-09-16 DIAGNOSIS — I2510 Atherosclerotic heart disease of native coronary artery without angina pectoris: Secondary | ICD-10-CM | POA: Diagnosis not present

## 2020-09-16 DIAGNOSIS — Z515 Encounter for palliative care: Secondary | ICD-10-CM | POA: Diagnosis not present

## 2020-09-16 DIAGNOSIS — G9341 Metabolic encephalopathy: Secondary | ICD-10-CM | POA: Diagnosis not present

## 2020-09-16 NOTE — Progress Notes (Signed)
Phone [phone] In person visit   Subjective:   Michelle [name] is a 87 y.o. year old very pleasant female patient who presents for/with See problem oriented charting Chief Complaint  Patient presents with  . 2 week follow up     This visit occurred during the SARS-CoV-2 public health emergency.  Safety protocols were in place, including screening questions prior to the visit, additional usage of staff PPE, and extensive cleaning of exam room while observing appropriate contact time as indicated for disinfecting solutions.   Past Medical History-  Patient Active Problem List   Diagnosis Date Noted  . Delirium 06/25/2020    Priority: High  . Chronic diastolic CHF (congestive heart failure) ([place]) 03/13/2014    Priority: High  . CAD- RCA PCI '90s, STEMI-RCA DES 02/18/14 03/05/2014    Priority: High  . Anemia, iron deficiency 10/23/2015    Priority: Medium  . Osteoarthritis, knee 05/13/2014    Priority: Medium  . Anxiety state 04/21/2014    Priority: Medium  . SOB (shortness of breath) 03/05/2014    Priority: Medium  . Essential hypertension 04/21/2006    Priority: Medium  . COPD (chronic obstructive pulmonary disease) ([place]) 04/21/2006    Priority: Medium  . Actinic keratosis 11/17/2017    Priority: Low  . CKD (chronic kidney disease), stage II 07/25/2014    Priority: Low  . Back pain, lumbosacral 10/10/2012    Priority: Low  . Acute metabolic encephalopathy 48/54/6270  . DM (diabetes mellitus), secondary, uncontrolled, w/neurologic complic ([place]) and long-term insulin use 08/19/2020  . Hypokalemia 08/19/2020  . Agitated 08/13/2020  . Lactic acidosis 08/12/2020  . Hyperlipidemia 08/12/2020    Medications- reviewed and updated Current Outpatient Medications  Medication Sig Dispense Refill  . ACCU-CHEK FASTCLIX LANCETS MISC     . acetaminophen (TYLENOL) 500 MG tablet Take 1,000 mg by mouth See admin instructions. Takes 1 tablet (500 mg totally) by mouth in the morning;  takes 1 tablet (500 mg) in the afternoon and bedtime as needed for pain    . albuterol (PROVENTIL) (2.5 MG/3ML) 0.083% nebulizer solution USE THREE MILLILITERS VIA NEBULIZATION BY MOUTH EVERY 6 HOURS AS NEEDED FOR WHEEZING OR SHORTNESS OF BREATH (Patient taking differently: Take 2.5 mg by nebulization every 6 (six) hours as needed for wheezing or shortness of breath.) 75 mL 3  . albuterol (VENTOLIN HFA) 108 (90 Base) MCG/ACT inhaler Inhale 1 puff into the lungs every 4 (four) hours as needed for wheezing or shortness of breath (RESCUE inhaler if advair not working). 18 g 5  . Alcohol Swabs (B-D SINGLE USE SWABS REGULAR) PADS USE  FOR  TESTING THREE TIMES DAILY 300 each 1  . amLODipine (NORVASC) 10 MG tablet TAKE 1 TABLET EVERY DAY (Patient taking differently: Take 10 mg by mouth daily.) 90 tablet 3  . aspirin 81 MG tablet Take 81 mg by mouth daily.     . Blood Glucose Calibration (ACCU-CHEK SMARTVIEW CONTROL) LIQD     . blood glucose meter kit and supplies KIT Dispense based on patient and insurance preference. Use up to four times daily as directed. (FOR ICD-9 250.00, 250.01). 1 each 0  . calcium carbonate (OSCAL) 1500 (600 Ca) MG TABS tablet Take 600 mg of elemental calcium by mouth daily with breakfast.    . carvedilol (COREG) 12.5 MG tablet Take 1 tablet (12.5 mg total) by mouth 2 (two) times daily. 60 tablet 3  . cholecalciferol (VITAMIN D3) 25 MCG (1000 UNIT) tablet Take 1,000 Units by  mouth daily.    . clopidogrel (PLAVIX) 75 MG tablet TAKE ONE TABLET BY MOUTH DAILY **PLEASE KEEP UPCOMING APPOINTMENT IN NOVEMBER WITH DOCTOR [facility] FOR FUTURE REFILLS 90 tablet 1  . diclofenac Sodium (VOLTAREN) 1 % GEL Apply 2 g topically 4 (four) times daily. 350 g 3  . DROPLET PEN NEEDLES 31G X 8 MM MISC USE AS DIRECTED WITH LANTUS SOLOSTAR 300 each 1  . enalapril (VASOTEC) 20 MG tablet TAKE 1 TABLET TWICE DAILY (Patient taking differently: Take 20 mg by mouth 2 (two) times daily.) 180 tablet 3  . fenofibrate  micronized (LOFIBRA) 134 MG capsule Take 1 capsule (134 mg total) by mouth daily. 90 capsule 3  . ferrous sulfate 325 (65 FE) MG tablet Twice a week (Patient taking differently: Take 325 mg by mouth See admin instructions. Takes 1 tablet (325 mg totally) by mouth every Mon and Friday) 18 tablet 3  . Fluticasone-Salmeterol (ADVAIR) 250-50 MCG/DOSE AEPB INHALE ONE PUFF BY MOUTH TWICE DAILY (Patient taking differently: Inhale 1 puff into the lungs in the morning and at bedtime.) 60 each 5  . folic acid (FOLVITE) 657 MCG tablet Take 400 mcg by mouth daily.    . furosemide (LASIX) 20 MG tablet Take 1 tablet (20 mg total) by mouth daily. 30 tablet 3  . glucose blood (ACCU-CHEK SMARTVIEW) test strip TEST BLOOD SUGAR THREE TIMES DAILY 300 strip 1  . hydrALAZINE (APRESOLINE) 50 MG tablet Take 1 tablet (50 mg total) by mouth every 8 (eight) hours. 90 tablet 3  . icosapent Ethyl (VASCEPA) 1 g capsule Take 2 capsules (2 g total) by mouth 2 (two) times daily. 360 capsule 3  . insulin glargine (LANTUS SOLOSTAR) 100 UNIT/ML Solostar Pen INJECT  17  TO  20 UNITS SUBCUTANEOUSLY EVERY MORNING (Patient taking differently: Inject 18 Units into the skin daily.) 90 mL 3  . Melatonin 5 MG CAPS Take 1 capsule by mouth at bedtime.     . metFORMIN (GLUCOPHAGE) 1000 MG tablet TAKE 1 TABLET TWICE DAILY (Patient taking differently: Take 1,000 mg by mouth 2 (two) times daily with a meal.) 180 tablet 1  . nitroGLYCERIN (NITROSTAT) 0.4 MG SL tablet PLACE 1 TABLET (0.4 MG TOTAL) UNDER THE TONGUE EVERY 5 (FIVE) MINUTESAS NEEDED FOR CHEST PAIN (CHEST PAIN OR SHORTNESS OF BREATH). (Patient taking differently: Place 0.4 mg under the tongue every 5 (five) minutes as needed for chest pain (max 3 doses).) 25 tablet 6  . pantoprazole (PROTONIX) 40 MG tablet TAKE ONE TABLET BY MOUTH DAILY 90 tablet 1  . rosuvastatin (CRESTOR) 40 MG tablet TAKE 1 TABLET EVERY DAY (Patient taking differently: Take 40 mg by mouth daily.) 90 tablet 2  .  spironolactone (ALDACTONE) 25 MG tablet Take 1 tablet (25 mg total) by mouth daily. 90 tablet 3  . tiotropium (SPIRIVA HANDIHALER) 18 MCG inhalation capsule Place 1 capsule (18 mcg total) into inhaler and inhale daily. 90 capsule 2  . vitamin B-12 (CYANOCOBALAMIN) 1000 MCG tablet Take 1,000 mcg by mouth daily.    . vitamin E 400 UNIT capsule Take 400 Units by mouth at bedtime.    . sertraline (ZOLOFT) 50 MG tablet Take 2 tablets (100 mg total) by mouth at bedtime. (Patient taking differently: Take 100 mg by mouth daily.) 60 tablet 0   No current facility-administered medications for this visit.     Objective:  BP (!) 144/58   Pulse 74   Temp 98.4 F (36.9 C) (Temporal)   Ht _0  (1.651  m)   Wt 180 lb (81.6 kg)   SpO2 96%   BMI 29.95 kg/m  Gen: NAD, resting comfortably CV: RRR no murmurs rubs or gallops Lungs: CTAB no crackles, wheeze, rhonchi Ext: no edema Skin: warm, dry Neuro: Wheelchair-bound    Assessment and Plan   #History of delirium #Osteoarthritis/polypharmacy potential with tramadol S: Patient seen on September 04, 2020 for follow-up for delirium-thought to be potentially hypertensive encephalopathy related -At last visit patient still having some intermittent confusion but overall much improved. Today confusion is almost completely gone- occasionally may have mild confusion but not major issues.  -Once again work-up had been very reassuring including head CT, EEG for possible seizure, TSH, ammonia, RPR, B12 -Significant agitation during hospitalization and was on Seroquel 50 mg-we reduced her to 25 mg for a week and 1/2 tablet for a week and then stop-today they report was able to come off of this completely  -Try to remain off tramadol due to confusion issues-could consider restarting just once a day instead of 3 times a day.  We will also adding Tylenol 1000 mg 3 times a day as suggested by Dr. Oval Linsey and encouraging Voltaren gel -We also referred her to [facility]-prior injections in the office here had not been beneficial. Injections 3 weeks ago have not been helpful. They will call back to check on next steps - in reflection not sure how much tramadol reallyhelped so opted to stay off -She was continued on Zoloft 100 mg -We also placed a referral to palliative care- have had one get to know each other conversation and scheduled to come back every 3 weeks.  -also doing PT to help strengthen her  A/P: Delirium seems to have largely resolved-patient has visit with neurology tomorrow to evaluate possible baseline cognitive declines further. - remain off tramadol as below  - was given antibiotics but ultimately didn't have UTI- didn't do the best with the antbiotic due to diarrhea - improving now off of it  Her knee arthritis/chronic pain -polypharmacy better off tramadol- will remain off as not clear how helpful it was for the knees -sounds like may get hyaluronic acid injections- continue follow up with [facility]  #hypertension S: medication: Carvedilol 12.5 mg twice daily, hydralazine 50 mg every 8 hours, amlodipine 10 mg, enalapril 20 mg, spironolactone 25 mg, Lasix 20 mg -Cortisol levels were normal last visit BP Readings from Last 3 Encounters:  09/21/20 (!) 144/58  09/04/20 (!) 100/58  09/03/20 128/60  A/P: last visit BP was low- today Blood pressure hair high- lets get ultrasound done and sleep study done before making any other changes. Let me know if home #s get higher than this  # Diabetes S: Medication:Lantus 18 units CBGs- has not seen increase since steroid injection Exercise and diet- exercise limited due to pain issues- knees in particular Lab Results  Component Value Date   HGBA1C 6.5 (H) 08/26/2020   HGBA1C 6.6 (H) 06/25/2020   HGBA1C 6.6 (H) 02/18/2020   A/P: Stable. Continue current medications.    #Chronic diastolic CHF S: Patient continues to do well despite amlodipine-needed for blood pressure  control.  She is also on spironolactone and Lasix 20 mg A/P: Stable. Continue current medications.   #Postmenopausal bleeding-referred to gynecology last visit-thankfully no significant anemia.  Gynecological exam was reassuring-pending pelvic ultrasound-she was treated for UTI Lab Results  Component Value Date   WBC 8.4 09/04/2020   HGB 12.4 09/04/2020   HCT 36.9 09/04/2020   MCV  89.2 09/04/2020   PLT 249.0 09/04/2020    # B12 deficiency S: Current treatment/medication (oral vs. IM):  b12 1000 mcg  Lab Results  Component Value Date   VITAMINB12 189 08/19/2020  A/P: Patient reports compliance with B12-I wonder if this is helping with cognitive improvements.  Has follow-up with neurology tomorrow  Recommended follow up: Patient would like to follow-up in 1 month Future Appointments  Date Time Provider [place]  09/22/2020  2:00 PM [name], MD [facility] None  09/30/2020 11:00 AM Troy Sine, MD [facility]  10/01/2020  2:00 PM [facility] PHARMACIST [facility]  10/15/2020  2:30 PM [facility] [place] RM 1 [facility] None  10/15/2020  3:00 PM [name], MD [facility] None  10/22/2020  4:20 PM [name], MD [facility]  06/07/2021  2:30 PM [facility] HEALTH COACH [facility]    Lab/Order associations:   ICD-10-CM   1. History of delirium  Z87.898   2. Hypertension associated with diabetes ([place])  E11.59    I15.2   3. Postmenopausal bleeding  N95.0   4. Primary osteoarthritis of both knees  M17.0   5. Chronic diastolic CHF (congestive heart failure) ([facility])  I50.32    Return precautions advised.  [name], MD

## 2020-09-16 NOTE — Patient Instructions (Addendum)
Team please check on US renal artery ordered in December with referral coordinator  Blood pressure hair high- lets get ultrasound done and sleep study done before making any other changes. Let me know if home #s get higher than this  No changes today

## 2020-09-17 DIAGNOSIS — E1169 Type 2 diabetes mellitus with other specified complication: Secondary | ICD-10-CM | POA: Diagnosis not present

## 2020-09-17 DIAGNOSIS — E785 Hyperlipidemia, unspecified: Secondary | ICD-10-CM | POA: Diagnosis not present

## 2020-09-17 DIAGNOSIS — G9341 Metabolic encephalopathy: Secondary | ICD-10-CM | POA: Diagnosis not present

## 2020-09-17 DIAGNOSIS — J449 Chronic obstructive pulmonary disease, unspecified: Secondary | ICD-10-CM | POA: Diagnosis not present

## 2020-09-17 DIAGNOSIS — I5032 Chronic diastolic (congestive) heart failure: Secondary | ICD-10-CM | POA: Diagnosis not present

## 2020-09-17 DIAGNOSIS — G8929 Other chronic pain: Secondary | ICD-10-CM | POA: Diagnosis not present

## 2020-09-17 DIAGNOSIS — M545 Low back pain, unspecified: Secondary | ICD-10-CM | POA: Diagnosis not present

## 2020-09-17 DIAGNOSIS — I2510 Atherosclerotic heart disease of native coronary artery without angina pectoris: Secondary | ICD-10-CM | POA: Diagnosis not present

## 2020-09-17 DIAGNOSIS — I110 Hypertensive heart disease with heart failure: Secondary | ICD-10-CM | POA: Diagnosis not present

## 2020-09-17 LAB — URINALYSIS, COMPLETE W/RFL CULTURE
Bilirubin Urine: NEGATIVE
Glucose, UA: NEGATIVE
Hgb urine dipstick: NEGATIVE
Hyaline Cast: NONE SEEN /LPF
Ketones, ur: NEGATIVE
Nitrites, Initial: NEGATIVE
Protein, ur: NEGATIVE
RBC / HPF: NONE SEEN /HPF (ref 0–2)
Specific Gravity, Urine: 1.015 (ref 1.001–1.03)
pH: 5 (ref 5.0–8.0)

## 2020-09-17 LAB — URINE CULTURE
MICRO NUMBER:: 11592402
SPECIMEN QUALITY:: ADEQUATE

## 2020-09-17 LAB — CULTURE INDICATED

## 2020-09-21 ENCOUNTER — Telehealth: Payer: Self-pay

## 2020-09-21 ENCOUNTER — Encounter: Payer: Self-pay | Admitting: Family Medicine

## 2020-09-21 ENCOUNTER — Ambulatory Visit (INDEPENDENT_AMBULATORY_CARE_PROVIDER_SITE_OTHER): Payer: Medicare HMO | Admitting: Family Medicine

## 2020-09-21 ENCOUNTER — Other Ambulatory Visit: Payer: Self-pay

## 2020-09-21 VITALS — BP 144/58 | HR 74 | Temp 98.4°F | Ht 65.0 in | Wt 180.0 lb

## 2020-09-21 DIAGNOSIS — E1159 Type 2 diabetes mellitus with other circulatory complications: Secondary | ICD-10-CM

## 2020-09-21 DIAGNOSIS — I5032 Chronic diastolic (congestive) heart failure: Secondary | ICD-10-CM | POA: Diagnosis not present

## 2020-09-21 DIAGNOSIS — I152 Hypertension secondary to endocrine disorders: Secondary | ICD-10-CM

## 2020-09-21 DIAGNOSIS — M170 Bilateral primary osteoarthritis of knee: Secondary | ICD-10-CM

## 2020-09-21 DIAGNOSIS — N950 Postmenopausal bleeding: Secondary | ICD-10-CM | POA: Diagnosis not present

## 2020-09-21 DIAGNOSIS — Z87898 Personal history of other specified conditions: Secondary | ICD-10-CM

## 2020-09-21 NOTE — Telephone Encounter (Signed)
Mr. Booz called regarding his mother.  She saw Dr. Dellis Filbert on 09/15/20. That note reads:   2. Abnormal urinalysis Abnormal U/A in an asymptomatic patient for UTI.  Will start ABTx if becomes Sxic or if U. Culture is positive.  Bactrim DS usage reviewed and prescription sent to pharmacy.  Patient misunderstood instructions and picked up the Rx and began to take it.  She was never symptomatic. Urine culture returned negative.  He called today because patient had side affect nausea, diarrhea and body aches. He also thought that her increased BP was related.  She stopped taking it on Saturday and he wanted to know if she needed something different.  Only some diarrhea lingers.  She has an appointment today at 1pm with her provider who treats her BP.  I advised him that no antibiotic needed as urine culture was negative.

## 2020-09-22 ENCOUNTER — Ambulatory Visit: Payer: Medicare HMO | Admitting: Neurology

## 2020-09-22 ENCOUNTER — Encounter: Payer: Self-pay | Admitting: Neurology

## 2020-09-22 VITALS — BP 130/73 | HR 75 | Ht 65.0 in | Wt 175.0 lb

## 2020-09-22 DIAGNOSIS — R413 Other amnesia: Secondary | ICD-10-CM | POA: Diagnosis not present

## 2020-09-22 DIAGNOSIS — G9341 Metabolic encephalopathy: Secondary | ICD-10-CM | POA: Diagnosis not present

## 2020-09-22 DIAGNOSIS — R404 Transient alteration of awareness: Secondary | ICD-10-CM

## 2020-09-22 DIAGNOSIS — I5032 Chronic diastolic (congestive) heart failure: Secondary | ICD-10-CM | POA: Diagnosis not present

## 2020-09-22 DIAGNOSIS — J449 Chronic obstructive pulmonary disease, unspecified: Secondary | ICD-10-CM | POA: Diagnosis not present

## 2020-09-22 DIAGNOSIS — I110 Hypertensive heart disease with heart failure: Secondary | ICD-10-CM | POA: Diagnosis not present

## 2020-09-22 DIAGNOSIS — E785 Hyperlipidemia, unspecified: Secondary | ICD-10-CM | POA: Diagnosis not present

## 2020-09-22 DIAGNOSIS — M545 Low back pain, unspecified: Secondary | ICD-10-CM | POA: Diagnosis not present

## 2020-09-22 DIAGNOSIS — E1169 Type 2 diabetes mellitus with other specified complication: Secondary | ICD-10-CM | POA: Diagnosis not present

## 2020-09-22 DIAGNOSIS — G8929 Other chronic pain: Secondary | ICD-10-CM | POA: Diagnosis not present

## 2020-09-22 DIAGNOSIS — I2510 Atherosclerotic heart disease of native coronary artery without angina pectoris: Secondary | ICD-10-CM | POA: Diagnosis not present

## 2020-09-22 NOTE — Progress Notes (Signed)
NEUROLOGY CONSULTATION NOTE  Michelle [name] MRN: [id number] DOB: [birth year]  Referring Michelle [name]: Dr. [name] Primary care Michelle [name]: Dr. [name]  Reason for consult:  confusion  Dear Dr Yong Channel:  Thank you for your kind referral of Michelle [name] for consultation of the above symptoms. Although her history is well known to you, please allow me to reiterate it for the purpose of our medical record. The patient was accompanied to the clinic by her son Michelle [name] who also provides collateral information. Records and images were personally reviewed where available.   HISTORY OF PRESENT ILLNESS: This is an 87 year old right-handed woman with a history of hypertension, hyperlipidemia, diabetes, CAD, 1st degree heart block, presenting for evaluation of confusion. Her son Michelle [name] is present to provide additional information. She states "I don't remember everything too well." Michelle [name] reports that for the most part she is doing okay. She has more confusion when she is admitted in the hospital, in November she did not even recognize Michelle [name] and kept thinking he was his brother for a few days. He notices memory issues on occasion. She has been living with Michelle [name] for over 60 years. Her other son was managing her medications because she is on so many, then during the pandemic, Michelle [name] took over 2 years ago. He manages meals. Her other son has been managing her finances for the past 3 years, "he just wanted to help." She stopped driving in 2800 because it got to where she did not know what she was doing, she could not remember what was supposed to be beside of her. Michelle [name] reports it was due to her vision. She is independent with dressing and bathing.He states she does not repeat herself for the most part. In December she had more confusion, not knowing what was going on. She was brought to the ER and had an MRI brain without contrast which did not show any acute changes, there was mild to moderate diffuse atrophy  and chronic microvascular disease. She was admitted to [facility] in January 2022 when he heard her snoring very loudly, he came to find her with eyes wide open, jaw clenched doing the snoring sound. She was unresponsive with body stiff. EMS got some response and she was confused in the ER wanting to go home. BP was 186/81. She had an EEG which showed intermittent generalized slowing, head CT no acute changes. TSH, B12, RPR normal. She was diagnosed with hypertensive encephalopathy and had delirium in the hospital that improved with Seroquel. Michelle [name] reports she has improved since then and has been back to normal. No further similar episodes.   She denies any gaps in time, olfactory/gustatory hallucinations, focal numbness/tingling/weakness, myoclonic jerks. She has diarrhea frequently with some incontinence and plans to start wearing Depends. No bladder incontinence. She cannot smell anything. She has occasional hand tremors that do not affect activities. She has pain in her knees. She denies any headaches, dizziness, diplopia, dysarthria/dysphagia, neck/back pain. She has sleep difficulties, sometimes staying awake all night long. She does not take too many naps in the day. No family history of dementia, significant head injuries, alcohol use. No family history of seizures.   Laboratory Data:  Lab Results  Component Value Date   TSH 0.820 08/12/2020   Lab Results  Component Value Date   [id number] 697 08/19/2020     PAST MEDICAL HISTORY: Past Medical History:  Diagnosis Date  . Anginal pain ([place])   . Arthritis   . AV block, 1st  degree   . CAD (coronary artery disease) 1990; 2015   Cardiac cath 1990 with Dr. Stuckey and [name] Residual CAD, non obstructive per 2015 cath. STEMI status post stent in August 2015.  . COPD (chronic obstructive pulmonary  disease) (HCC)   . Diabetes mellitus type 2, insulin dependent (HCC)   . Essential hypertension   . Hyperlipidemia with target LDL less than 70   . Myocardial infarction (HCC)   . Pericarditis-post MI (short course of steroids) 03/06/2014  . 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 Shortness of breath     PAST SURGICAL HISTORY: Past Surgical History:  Procedure Laterality Date  . CATARACT EXTRACTION  2020   right and left eye   . CHOLECYSTECTOMY    . CORONARY ANGIOPLASTY WITH STENT PLACEMENT  02/18/14   Promus DES to RCA  . LEFT HEART CATHETERIZATION WITH CORONARY ANGIOGRAM N/A 02/18/2014   Procedure: LEFT HEART CATHETERIZATION WITH CORONARY ANGIOGRAM;  Surgeon: Jayadeep S Varanasi, MD;  Location: [facility] CATH LAB;  Service: Cardiovascular;  Laterality: N/A;  . PTCA  1990   PTCA of RCA  . TRANSTHORACIC ECHOCARDIOGRAM  02/02/2012   mild LVH, EF 55-60%, Normal WM, Gr 1 DD; Mild MR    MEDICATIONS: Current Outpatient Medications on File Prior to Visit  Medication Sig Dispense Refill  . ACCU-CHEK FASTCLIX LANCETS MISC     . acetaminophen (TYLENOL) 500 MG tablet Take 1,000 mg by mouth See admin instructions. Takes 1 tablet (500 mg totally) by mouth in the morning; takes 1 tablet (500 mg) in the afternoon and bedtime as needed for pain    . albuterol (PROVENTIL) (2.5 MG/3ML) 0.083% nebulizer solution USE THREE MILLILITERS VIA NEBULIZATION BY MOUTH EVERY 6 HOURS AS NEEDED FOR WHEEZING OR SHORTNESS OF BREATH (Patient taking differently: Take 2.5 mg by nebulization every 6 (six) hours as needed for wheezing or shortness of breath.) 75 mL 3  . albuterol (VENTOLIN HFA) 108 (90 Base) MCG/ACT inhaler Inhale 1 puff into the lungs every 4 (four) hours as needed for wheezing or shortness of breath (RESCUE inhaler if advair not working). 18 g 5  . Alcohol Swabs (B-D SINGLE USE SWABS REGULAR) PADS USE  FOR  TESTING THREE TIMES DAILY 300  each 1  . amLODipine (NORVASC) 10 MG tablet TAKE 1 TABLET EVERY DAY (Patient taking differently: Take 10 mg by mouth daily.) 90 tablet 3  . aspirin 81 MG tablet Take 81 mg by mouth daily.     . Blood Glucose Calibration (ACCU-CHEK SMARTVIEW CONTROL) LIQD     . blood glucose meter kit and supplies KIT Dispense based on patient and insurance preference. Use up to four times daily as directed. (FOR ICD-9 250.00, 250.01). 1 each 0  . calcium carbonate (OSCAL) 1500 (600 Ca) MG TABS tablet Take 600 mg of elemental calcium by mouth daily with breakfast.    . carvedilol (COREG) 12.5 MG tablet Take 1 tablet (12.5 mg total) by mouth 2 (two) times daily. 60 tablet 3  . cholecalciferol (VITAMIN D3) 25 MCG (1000 UNIT) tablet Take 1,000 Units by mouth daily.    . clopidogrel (PLAVIX) 75 MG tablet TAKE ONE TABLET BY MOUTH DAILY **PLEASE KEEP UPCOMING APPOINTMENT IN NOVEMBER WITH DOCTOR MCAHANY FOR FUTURE REFILLS 90   tablet 1  . diclofenac Sodium (VOLTAREN) 1 % GEL Apply 2 g topically 4 (four) times daily. 350 g 3  . DROPLET PEN NEEDLES 31G X 8 MM MISC USE AS DIRECTED WITH LANTUS SOLOSTAR 300 each 1  . enalapril (VASOTEC) 20 MG tablet TAKE 1 TABLET TWICE DAILY (Patient taking differently: Take 20 mg by mouth 2 (two) times daily.) 180 tablet 3  . fenofibrate micronized (LOFIBRA) 134 MG capsule Take 1 capsule (134 mg total) by mouth daily. 90 capsule 3  . ferrous sulfate 325 (65 FE) MG tablet Twice a week (Patient taking differently: Take 325 mg by mouth See admin instructions. Takes 1 tablet (325 mg totally) by mouth every Mon and Friday) 18 tablet 3  . Fluticasone-Salmeterol (ADVAIR) 250-50 MCG/DOSE AEPB INHALE ONE PUFF BY MOUTH TWICE DAILY (Patient taking differently: Inhale 1 puff into the lungs in the morning and at bedtime.) 60 each 5  . folic acid (FOLVITE) 121 MCG tablet Take 400 mcg by mouth daily.    . furosemide (LASIX) 20 MG tablet Take 1 tablet (20 mg total) by mouth daily. 30 tablet 3  . glucose blood  (ACCU-CHEK SMARTVIEW) test strip TEST BLOOD SUGAR THREE TIMES DAILY 300 strip 1  . hydrALAZINE (APRESOLINE) 50 MG tablet Take 1 tablet (50 mg total) by mouth every 8 (eight) hours. 90 tablet 3  . icosapent Ethyl (VASCEPA) 1 g capsule Take 2 capsules (2 g total) by mouth 2 (two) times daily. 360 capsule 3  . insulin glargine (LANTUS SOLOSTAR) 100 UNIT/ML Solostar Pen INJECT  17  TO  20 UNITS SUBCUTANEOUSLY EVERY MORNING (Patient taking differently: Inject 18 Units into the skin daily.) 90 mL 3  . Melatonin 5 MG CAPS Take 1 capsule by mouth at bedtime.     . metFORMIN (GLUCOPHAGE) 1000 MG tablet TAKE 1 TABLET TWICE DAILY (Patient taking differently: Take 1,000 mg by mouth 2 (two) times daily with a meal.) 180 tablet 1  . nitroGLYCERIN (NITROSTAT) 0.4 MG SL tablet PLACE 1 TABLET (0.4 MG TOTAL) UNDER THE TONGUE EVERY 5 (FIVE) MINUTESAS NEEDED FOR CHEST PAIN (CHEST PAIN OR SHORTNESS OF BREATH). (Patient taking differently: Place 0.4 mg under the tongue every 5 (five) minutes as needed for chest pain (max 3 doses).) 25 tablet 6  . pantoprazole (PROTONIX) 40 MG tablet TAKE ONE TABLET BY MOUTH DAILY 90 tablet 1  . rosuvastatin (CRESTOR) 40 MG tablet TAKE 1 TABLET EVERY DAY (Patient taking differently: Take 40 mg by mouth daily.) 90 tablet 2  . sertraline (ZOLOFT) 50 MG tablet Take 2 tablets (100 mg total) by mouth at bedtime. (Patient taking differently: Take 100 mg by mouth daily.) 60 tablet 0  . spironolactone (ALDACTONE) 25 MG tablet Take 1 tablet (25 mg total) by mouth daily. 90 tablet 3  . tiotropium (SPIRIVA HANDIHALER) 18 MCG inhalation capsule Place 1 capsule (18 mcg total) into inhaler and inhale daily. 90 capsule 2  . vitamin B-12 (CYANOCOBALAMIN) 1000 MCG tablet Take 1,000 mcg by mouth daily.    . vitamin E 400 UNIT capsule Take 400 Units by mouth at bedtime.     No current facility-administered medications on file prior to visit.    ALLERGIES: Allergies  Allergen Reactions  . Codeine Sulfate  Nausea Only  . Morphine Sulfate Nausea Only    FAMILY HISTORY: Family History  Problem Relation Age of Onset  . Heart attack Mother 89  . Cancer Father 54  . Cancer Maternal Grandfather   . Cancer Paternal Grandmother   .  Cancer Paternal Grandfather   . Diabetes Son   . Liver cancer Brother   . Bone cancer Brother   . Heart attack Son   . Hypertension Son   . Diabetes Son   . Hyperlipidemia Son     SOCIAL HISTORY: Social History   Socioeconomic History  . Marital status: Single    Spouse name: Not on file  . Number of children: 2  . Years of education: Not on file  . Highest education level: Not on file  Occupational History  . Occupation: Retired    Employer: RETIRED  Tobacco Use  . Smoking status: Former Smoker    Packs/day: 1.00    Years: 55.00    Pack years: 55.00    Types: Cigarettes    Quit date: 09/29/2006    Years since quitting: 13.9  . Smokeless tobacco: Never Used  Vaping Use  . Vaping Use: Never used  Substance and Sexual Activity  . Alcohol use: No  . Drug use: No  . Sexual activity: Not Currently  Other Topics Concern  . Not on file  Social History Narrative   Lives with her son but he works all day. Home for dinner.  Independent ADL. Needs assist on some IADL. Right handed    Social Determinants of Health   Financial Resource Strain: Low Risk   . Difficulty of Paying Living Expenses: Not hard at all  Food Insecurity: No Food Insecurity  . Worried About Running Out of Food in the Last Year: Never true  . Ran Out of Food in the Last Year: Never true  Transportation Needs: No Transportation Needs  . Lack of Transportation (Medical): No  . Lack of Transportation (Non-Medical): No  Physical Activity: Inactive  . Days of Exercise per Week: 0 days  . Minutes of Exercise per Session: 0 min  Stress: No Stress Concern Present  . Feeling of Stress : Not at all  Social Connections: Unknown  . Frequency of Communication with Friends and Family:  Once a week  . Frequency of Social Gatherings with Friends and Family: Never  . Attends Religious Services: Never  . Active Member of Clubs or Organizations: No  . Attends Club or Organization Meetings: Never  . Marital Status: Not on file  Intimate Partner Violence: Not At Risk  . Fear of Current or Ex-Partner: No  . Emotionally Abused: No  . Physically Abused: No  . Sexually Abused: No     PHYSICAL EXAM: Vitals:   09/22/20 1407  BP: 130/73  Pulse: 75  SpO2: 97%   General: No acute distress, sitting on wheelchair Head:  Normocephalic/atraumatic Skin/Extremities: No rash, no edema Neurological Exam: Mental status: alert and oriented to person, place, and time, no dysarthria or aphasia, Fund of knowledge is appropriate.  Recent and remote memory are impaired.  Attention and concentration are reduced.    Able to name objects and repeat some phrases. MOCA 14/30 Montreal Cognitive Assessment  09/22/2020  Visuospatial/ Executive (0/5) 1  Naming (0/3) 2  Attention: Read list of digits (0/2) 2  Attention: Read list of letters (0/1) 0  Attention: Serial 7 subtraction starting at 100 (0/3) 1  Language: Repeat phrase (0/2) 1  Language : Fluency (0/1) 0  Abstraction (0/2) 0  Delayed Recall (0/5) 0  Orientation (0/6) 6  Total 13  Adjusted Score (based on education) 14  '  Cranial nerves: CN I: not tested CN II: pupils equal, round and reactive to light, visual fields intact CN   III, IV, VI:  full range of motion, no nystagmus, no ptosis CN V: facial sensation intact CN VII: upper and lower face symmetric CN VIII: hearing intact to conversation Bulk & Tone: normal, no fasciculations. Motor: 5/5 throughout with no pronator drift. Sensation: intact to light touch, cold, pin, vibration and joint position sense.  No extinction to double simultaneous stimulation.   Deep Tendon Reflexes: +1 throughout Cerebellar: no incoordination on finger to nose testing Tremor:  none   IMPRESSION: This is an 87 year old right-handed woman with a history of hypertension, hyperlipidemia, diabetes, CAD, 1st degree heart block, presenting for evaluation of confusion. She was in the ER in December 2021 for an episode of confusion, MRI brain no acute changes. She had an episode of unresponsiveness/stiffening with sonorous respirations in January 2022, EEG showed diffuse slowing. She has had improvement in mental status since then, back to baseline per son, MOCA score today 14/30 indicating dementia, possibly vascular. There is still concern about the possibility of seizure, dementia is a risk factor. A 24-hour EEG will be ordered to further classify symptoms. Continue close supervision, she does not drive. Follow-up in 6-7 months, they know to call for any changes.   Thank you for allowing me to participate in the care of this patient. Please do not hesitate to call for any questions or concerns.   Ellouise Newer, M.D.  CC: Dr. Yong Channel

## 2020-09-22 NOTE — Patient Instructions (Signed)
Good to meet you!  1. Schedule 24-hour EEG  2. Continue close supervision  3. Follow-up in 6-7 months, call for any changes   FALL PRECAUTIONS: Be cautious when walking. Scan the area for obstacles that may increase the risk of trips and falls. When getting up in the mornings, sit up at the edge of the bed for a few minutes before getting out of bed. Consider elevating the bed at the head end to avoid drop of blood pressure when getting up. Walk always in a well-lit room (use night lights in the walls). Avoid area rugs or power cords from appliances in the middle of the walkways. Use a walker or a cane if necessary and consider physical therapy for balance exercise. Get your eyesight checked regularly.   HOME SAFETY: Consider the safety of the kitchen when operating appliances like stoves, microwave oven, and blender. Consider having supervision and share cooking responsibilities until no longer able to participate in those. Accidents with firearms and other hazards in the house should be identified and addressed as well.  ABILITY TO BE LEFT ALONE: If patient is unable to contact 911 operator, consider using LifeLine, or when the need is there, arrange for someone to stay with patients. Smoking is a fire hazard, consider supervision or cessation. Risk of wandering should be assessed by caregiver and if detected at any point, supervision and safe proof recommendations should be instituted.  MEDICATION SUPERVISION: Inability to self-administer medication needs to be constantly addressed. Implement a mechanism to ensure safe administration of the medications.  RECOMMENDATIONS FOR ALL PATIENTS WITH MEMORY PROBLEMS: 1. Continue to exercise (Recommend 30 minutes of walking everyday, or 3 hours every week) 2. Increase social interactions - continue going to [place] and enjoy social gatherings with friends and family 3. Eat healthy, avoid fried foods and eat more fruits and vegetables 4. Maintain  adequate blood pressure, blood sugar, and blood cholesterol level. Reducing the risk of stroke and cardiovascular disease also helps promoting better memory. 5. Avoid stressful situations. Live a simple life and avoid aggravations. Organize your time and prepare for the next day in anticipation. 6. Sleep well, avoid any interruptions of sleep and avoid any distractions in the bedroom that may interfere with adequate sleep quality 7. Avoid sugar, avoid sweets as there is a strong link between excessive sugar intake, diabetes, and cognitive impairment The Mediterranean diet has been shown to help patients reduce the risk of progressive memory disorders and reduces cardiovascular risk. This includes eating fish, eat fruits and green leafy vegetables, nuts like almonds and hazelnuts, walnuts, and also use olive oil. Avoid fast foods and fried foods as much as possible. Avoid sweets and sugar as sugar use has been linked to worsening of memory function.

## 2020-09-23 DIAGNOSIS — I5032 Chronic diastolic (congestive) heart failure: Secondary | ICD-10-CM | POA: Diagnosis not present

## 2020-09-23 DIAGNOSIS — I110 Hypertensive heart disease with heart failure: Secondary | ICD-10-CM | POA: Diagnosis not present

## 2020-09-23 DIAGNOSIS — M545 Low back pain, unspecified: Secondary | ICD-10-CM | POA: Diagnosis not present

## 2020-09-23 DIAGNOSIS — G8929 Other chronic pain: Secondary | ICD-10-CM | POA: Diagnosis not present

## 2020-09-23 DIAGNOSIS — E785 Hyperlipidemia, unspecified: Secondary | ICD-10-CM | POA: Diagnosis not present

## 2020-09-23 DIAGNOSIS — J449 Chronic obstructive pulmonary disease, unspecified: Secondary | ICD-10-CM | POA: Diagnosis not present

## 2020-09-23 DIAGNOSIS — G9341 Metabolic encephalopathy: Secondary | ICD-10-CM | POA: Diagnosis not present

## 2020-09-23 DIAGNOSIS — E1169 Type 2 diabetes mellitus with other specified complication: Secondary | ICD-10-CM | POA: Diagnosis not present

## 2020-09-23 DIAGNOSIS — I2510 Atherosclerotic heart disease of native coronary artery without angina pectoris: Secondary | ICD-10-CM | POA: Diagnosis not present

## 2020-09-24 DIAGNOSIS — J449 Chronic obstructive pulmonary disease, unspecified: Secondary | ICD-10-CM | POA: Diagnosis not present

## 2020-09-24 DIAGNOSIS — G9341 Metabolic encephalopathy: Secondary | ICD-10-CM | POA: Diagnosis not present

## 2020-09-24 DIAGNOSIS — E785 Hyperlipidemia, unspecified: Secondary | ICD-10-CM | POA: Diagnosis not present

## 2020-09-24 DIAGNOSIS — I110 Hypertensive heart disease with heart failure: Secondary | ICD-10-CM | POA: Diagnosis not present

## 2020-09-24 DIAGNOSIS — E1169 Type 2 diabetes mellitus with other specified complication: Secondary | ICD-10-CM | POA: Diagnosis not present

## 2020-09-24 DIAGNOSIS — G8929 Other chronic pain: Secondary | ICD-10-CM | POA: Diagnosis not present

## 2020-09-24 DIAGNOSIS — M545 Low back pain, unspecified: Secondary | ICD-10-CM | POA: Diagnosis not present

## 2020-09-24 DIAGNOSIS — I5032 Chronic diastolic (congestive) heart failure: Secondary | ICD-10-CM | POA: Diagnosis not present

## 2020-09-24 DIAGNOSIS — I2510 Atherosclerotic heart disease of native coronary artery without angina pectoris: Secondary | ICD-10-CM | POA: Diagnosis not present

## 2020-09-28 ENCOUNTER — Telehealth: Payer: Self-pay | Admitting: *Deleted

## 2020-09-28 DIAGNOSIS — E1169 Type 2 diabetes mellitus with other specified complication: Secondary | ICD-10-CM | POA: Diagnosis not present

## 2020-09-28 DIAGNOSIS — G9341 Metabolic encephalopathy: Secondary | ICD-10-CM | POA: Diagnosis not present

## 2020-09-28 DIAGNOSIS — M545 Low back pain, unspecified: Secondary | ICD-10-CM | POA: Diagnosis not present

## 2020-09-28 DIAGNOSIS — I5032 Chronic diastolic (congestive) heart failure: Secondary | ICD-10-CM | POA: Diagnosis not present

## 2020-09-28 DIAGNOSIS — G8929 Other chronic pain: Secondary | ICD-10-CM | POA: Diagnosis not present

## 2020-09-28 DIAGNOSIS — E785 Hyperlipidemia, unspecified: Secondary | ICD-10-CM | POA: Diagnosis not present

## 2020-09-28 DIAGNOSIS — I2510 Atherosclerotic heart disease of native coronary artery without angina pectoris: Secondary | ICD-10-CM | POA: Diagnosis not present

## 2020-09-28 DIAGNOSIS — J449 Chronic obstructive pulmonary disease, unspecified: Secondary | ICD-10-CM | POA: Diagnosis not present

## 2020-09-28 DIAGNOSIS — I110 Hypertensive heart disease with heart failure: Secondary | ICD-10-CM | POA: Diagnosis not present

## 2020-09-28 NOTE — Telephone Encounter (Signed)
LMOM to call back to schedule a 24 hour ambulatory EEG.

## 2020-09-29 DIAGNOSIS — I2510 Atherosclerotic heart disease of native coronary artery without angina pectoris: Secondary | ICD-10-CM | POA: Diagnosis not present

## 2020-09-29 DIAGNOSIS — G9341 Metabolic encephalopathy: Secondary | ICD-10-CM | POA: Diagnosis not present

## 2020-09-29 DIAGNOSIS — E1169 Type 2 diabetes mellitus with other specified complication: Secondary | ICD-10-CM | POA: Diagnosis not present

## 2020-09-29 DIAGNOSIS — E785 Hyperlipidemia, unspecified: Secondary | ICD-10-CM | POA: Diagnosis not present

## 2020-09-29 DIAGNOSIS — G8929 Other chronic pain: Secondary | ICD-10-CM | POA: Diagnosis not present

## 2020-09-29 DIAGNOSIS — I5032 Chronic diastolic (congestive) heart failure: Secondary | ICD-10-CM | POA: Diagnosis not present

## 2020-09-29 DIAGNOSIS — M545 Low back pain, unspecified: Secondary | ICD-10-CM | POA: Diagnosis not present

## 2020-09-29 DIAGNOSIS — I110 Hypertensive heart disease with heart failure: Secondary | ICD-10-CM | POA: Diagnosis not present

## 2020-09-29 DIAGNOSIS — J449 Chronic obstructive pulmonary disease, unspecified: Secondary | ICD-10-CM | POA: Diagnosis not present

## 2020-09-30 ENCOUNTER — Other Ambulatory Visit: Payer: Self-pay | Admitting: Family Medicine

## 2020-09-30 ENCOUNTER — Encounter (HOSPITAL_BASED_OUTPATIENT_CLINIC_OR_DEPARTMENT_OTHER): Payer: Medicare HMO | Admitting: Cardiovascular Disease

## 2020-09-30 DIAGNOSIS — J449 Chronic obstructive pulmonary disease, unspecified: Secondary | ICD-10-CM | POA: Diagnosis not present

## 2020-09-30 DIAGNOSIS — E1169 Type 2 diabetes mellitus with other specified complication: Secondary | ICD-10-CM | POA: Diagnosis not present

## 2020-09-30 DIAGNOSIS — I110 Hypertensive heart disease with heart failure: Secondary | ICD-10-CM | POA: Diagnosis not present

## 2020-09-30 DIAGNOSIS — I5032 Chronic diastolic (congestive) heart failure: Secondary | ICD-10-CM | POA: Diagnosis not present

## 2020-09-30 DIAGNOSIS — I2510 Atherosclerotic heart disease of native coronary artery without angina pectoris: Secondary | ICD-10-CM | POA: Diagnosis not present

## 2020-09-30 DIAGNOSIS — M545 Low back pain, unspecified: Secondary | ICD-10-CM | POA: Diagnosis not present

## 2020-09-30 DIAGNOSIS — G8929 Other chronic pain: Secondary | ICD-10-CM | POA: Diagnosis not present

## 2020-09-30 DIAGNOSIS — G9341 Metabolic encephalopathy: Secondary | ICD-10-CM | POA: Diagnosis not present

## 2020-09-30 DIAGNOSIS — E785 Hyperlipidemia, unspecified: Secondary | ICD-10-CM | POA: Diagnosis not present

## 2020-10-01 ENCOUNTER — Other Ambulatory Visit: Payer: Self-pay

## 2020-10-01 ENCOUNTER — Ambulatory Visit (INDEPENDENT_AMBULATORY_CARE_PROVIDER_SITE_OTHER): Payer: Medicare HMO | Admitting: Pharmacist

## 2020-10-01 VITALS — BP 126/60 | HR 70 | Resp 14 | Ht 65.0 in

## 2020-10-01 DIAGNOSIS — I10 Essential (primary) hypertension: Secondary | ICD-10-CM | POA: Diagnosis not present

## 2020-10-01 NOTE — Patient Instructions (Addendum)
It was nice meeting you today!  We would like to keep your blood pressure less than 130/80  You are at your goal blood pressure right now so we will not add any new medications  We may consider reducing your hydralazine dose since your blood pressure is trending down but we will wait until your son sends over your blood pressure readings  Please call with any questions!  Karren Cobble, PharmD, BCACP, [place], [place] [address] [place], [place] [address] Phone: [phone]; Fax: [phone] 10/01/2020 2:35 PM

## 2020-10-01 NOTE — Progress Notes (Signed)
Patient ID: Michelle [name]                 DOB: 11-11-1932                      MRN: [id number]     HPI: Michelle [name] is a 87 y.o. female referred by Dr. Oval Linsey to HTN clinic. PMH is significant for CAD, DM, STEMI, tobacco abuse, HTN, and CHF.  Patient was admitted in 2/22 with HTN and confusion and hydralazine was added to regimen.  Patient mental status began to improve and patient was discharged.  At follow up with Dr. Oval Linsey, metoprolol was switched to carvedilol and patient was referred to HTN clinic.  Patient presents today in good spirits in wheelchair.  She lives with her son Michelle [name] who takes her blood pressure and manages her medications.  Her other son Michelle [name] has brought her to appointment.  Patient denies SOB, chest pain, dizziness, headache, and vision changes.  Has an occasional cough at night which son attributes to COPD.    Wakes up between 7-8 AM and son gives her morning medications and takes BP soon after.  Sons are concerned morning BP readings are too low.  Recent BP readings 3/16: 118/61, 127/74 3/15: 122/60, 121/75 3/14: 117/54 3/13: 139/65, 157/76 3/12: 170/86, 140/83 3/11: 188/104, 154/81  Patient takes midday dose of hydralazine at [facility] and PM meds between 7-8PM right before bed.  Sleeps and naps often throughout the day and sometimes has issues sleeping through the night.  Does word puzzles when she can not sleep.  Patient denies any hypotensive symptoms. Uses a walker at home and reports she would be more physically active if it was not for her knee pain.  Uses tylenol and diclofenac gel on knee but she says it is not effective.  Current HTN meds: amlodipine 89m, carvedilol 251mBID, enalapril 2032mID, furosemide 20 mg daily, hydralazine 50 mg TID, spironolactone 74m20mily  Previously tried: metoprolol tartrate and succinate   BP goal: <130/80  Social History: Quit smoking in 2008  Home BP readings:  100/48, 111/51, 114/61  Wt Readings from Last 3  Encounters:  09/22/20 175 lb (79.4 kg)  09/21/20 180 lb (81.6 kg)  09/04/20 176 lb 12.8 oz (80.2 kg)   BP Readings from Last 3 Encounters:  09/22/20 130/73  09/21/20 (!) 144/58  09/04/20 (!) 100/58   Pulse Readings from Last 3 Encounters:  09/22/20 75  09/21/20 74  09/04/20 78    Renal function: CrCl cannot be calculated (Patient's most recent lab result is older than the maximum 21 days allowed.).  Past Medical History:  Diagnosis Date  . Anginal pain (HCC)[place]. Arthritis   . AV block, 1st degree   . CAD (coronary artery disease) 1990; 2015   Cardiac cath 1990 with Dr. StucLia Foyer [name] Residual CAD, non obstructive per 2015 cath. STEMI status post stent in August 2015.  . COMarland KitchenD (chronic obstructive pulmonary disease) (HCC)[place]. Diabetes mellitus type 2, insulin dependent (HCC)[place]. Essential hypertension   . Hyperlipidemia with target LDL less than 70   . Myocardial infarction (HCC)[place]. Pericarditis-post MI (short course of steroids) 03/06/2014  . 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 Shortness of breath     Current Outpatient Medications on File Prior to Visit  Medication Sig Dispense Refill  . ACCU-CHEK FASTCLIX LANCETS MISC     . acetaminophen (TYLENOL) 500 MG tablet Take 1,000 mg by mouth See admin instructions. Takes 1 tablet (500 mg totally) by mouth in the morning; takes 1 tablet (500 mg) in the afternoon and bedtime as needed for pain    . albuterol (PROVENTIL) (2.5 MG/3ML) 0.083% nebulizer solution USE THREE MILLILITERS VIA NEBULIZATION BY MOUTH EVERY 6 HOURS AS NEEDED FOR WHEEZING OR SHORTNESS OF BREATH (Patient taking differently: Take 2.5 mg by nebulization every 6 (six) hours as needed for wheezing or shortness of breath.) 75 mL 3  .  albuterol (VENTOLIN HFA) 108 (90 Base) MCG/ACT inhaler Inhale 1 puff into the lungs every 4 (four) hours as needed for wheezing or shortness of breath (RESCUE inhaler if advair not working). 18 g 5  . Alcohol Swabs (B-D SINGLE USE SWABS REGULAR) PADS USE  FOR  TESTING THREE TIMES DAILY 300 each 1  . amLODipine (NORVASC) 10 MG tablet TAKE 1 TABLET EVERY DAY (Patient taking differently: Take 10 mg by mouth daily.) 90 tablet 3  . aspirin 81 MG tablet Take 81 mg by mouth daily.     . Blood Glucose Calibration (ACCU-CHEK SMARTVIEW CONTROL) LIQD     . blood glucose meter kit and supplies KIT Dispense based on patient and insurance preference. Use up to four times daily as directed. (FOR ICD-9 250.00, 250.01). 1 each 0  . calcium carbonate (OSCAL) 1500 (600 Ca) MG TABS tablet Take 600 mg of elemental calcium by mouth daily with breakfast.    . carvedilol (COREG) 12.5 MG tablet Take 1 tablet (12.5 mg total) by mouth 2 (two) times daily. 60 tablet 3  . cholecalciferol (VITAMIN D3) 25 MCG (1000 UNIT) tablet Take 1,000 Units by mouth daily.    . clopidogrel (PLAVIX) 75 MG tablet TAKE ONE TABLET BY MOUTH DAILY **PLEASE KEEP UPCOMING APPOINTMENT IN NOVEMBER WITH DOCTOR [facility] FOR FUTURE REFILLS 90 tablet 1  . diclofenac Sodium (VOLTAREN) 1 % GEL Apply 2 g topically 4 (four) times daily. 350 g 3  . DROPLET PEN NEEDLES 31G X 8 MM MISC USE AS DIRECTED WITH LANTUS SOLOSTAR 300 each 1  . enalapril (VASOTEC) 20 MG tablet TAKE 1 TABLET TWICE DAILY (Patient taking differently: Take 20 mg by mouth 2 (two) times daily.) 180 tablet 3  . fenofibrate micronized (LOFIBRA) 134 MG capsule Take 1 capsule (134 mg total) by mouth daily. 90 capsule 3  . ferrous sulfate 325 (65 FE) MG tablet Twice a week (Patient taking differently: Take 325 mg by mouth See admin instructions. Takes 1 tablet (325 mg totally) by mouth every Mon and Friday) 18 tablet 3  . Fluticasone-Salmeterol (ADVAIR) 250-50 MCG/DOSE AEPB INHALE ONE PUFF BY MOUTH  TWICE DAILY (Patient taking differently: Inhale 1 puff into the lungs in the morning and at bedtime.) 60 each 5  . folic acid (FOLVITE) 253 MCG tablet Take 400 mcg by mouth daily.    . furosemide (LASIX) 20 MG tablet Take 1 tablet (20 mg total) by mouth daily. 30 tablet 3  . glucose blood (ACCU-CHEK SMARTVIEW) test strip TEST BLOOD SUGAR THREE TIMES DAILY 300 strip 1  . hydrALAZINE (APRESOLINE) 50 MG tablet Take 1 tablet (50 mg total) by mouth every 8 (eight) hours. 90 tablet 3  . icosapent Ethyl (VASCEPA) 1 g capsule  Take 2 capsules (2 g total) by mouth 2 (two) times daily. 360 capsule 3  . insulin glargine (LANTUS SOLOSTAR) 100 UNIT/ML Solostar Pen INJECT  17  TO  20 UNITS SUBCUTANEOUSLY EVERY MORNING (Patient taking differently: Inject 18 Units into the skin daily.) 90 mL 3  . Melatonin 5 MG CAPS Take 1 capsule by mouth at bedtime.     . metFORMIN (GLUCOPHAGE) 1000 MG tablet TAKE 1 TABLET TWICE DAILY 180 tablet 1  . nitroGLYCERIN (NITROSTAT) 0.4 MG SL tablet PLACE 1 TABLET (0.4 MG TOTAL) UNDER THE TONGUE EVERY 5 (FIVE) MINUTESAS NEEDED FOR CHEST PAIN (CHEST PAIN OR SHORTNESS OF BREATH). (Patient taking differently: Place 0.4 mg under the tongue every 5 (five) minutes as needed for chest pain (max 3 doses).) 25 tablet 6  . pantoprazole (PROTONIX) 40 MG tablet TAKE ONE TABLET BY MOUTH DAILY 90 tablet 1  . rosuvastatin (CRESTOR) 40 MG tablet TAKE 1 TABLET EVERY DAY (Patient taking differently: Take 40 mg by mouth daily.) 90 tablet 2  . sertraline (ZOLOFT) 50 MG tablet Take 2 tablets (100 mg total) by mouth at bedtime. (Patient taking differently: Take 100 mg by mouth daily.) 60 tablet 0  . spironolactone (ALDACTONE) 25 MG tablet Take 1 tablet (25 mg total) by mouth daily. 90 tablet 3  . tiotropium (SPIRIVA HANDIHALER) 18 MCG inhalation capsule Place 1 capsule (18 mcg total) into inhaler and inhale daily. 90 capsule 2  . vitamin B-12 (CYANOCOBALAMIN) 1000 MCG tablet Take 1,000 mcg by mouth daily.    .  vitamin E 400 UNIT capsule Take 400 Units by mouth at bedtime.     No current facility-administered medications on file prior to visit.    Allergies  Allergen Reactions  . Codeine Sulfate Nausea Only  . Morphine Sulfate Nausea Only     Assessment/Plan:  1. Hypertension - Patient BP in room today 126/60 (verified twice) which is at goal of <130/80.  No medications need to be added today.  Sons are concerned about morning blood pressure readings which are variable.  Occasionally at goal but sometimes high as well.  Evening readings are better controlled.  Since son is concerned regarding possible low blood pressures in morning, recommended he could give 1/2 hydralazine tablet in morning (39m) and continue to monitor to see if that was helpful.  Son voiced understanding.  Continue amlodipine 135mdaily Continue enalapril 2060mID Continue spironolactone 42m62mily Continue carvedilol 42mg1m Continue furosemide 20mg 59my Continue hydralazine 50mg T69m May give 42mg in46mning if concerned BP is low. Recheck in 4 weeks  Chris PaKarren Cobble, BCACP, CDCES, [place]ast [place] [address] [phone] 5:12 PM

## 2020-10-02 ENCOUNTER — Telehealth: Payer: Self-pay

## 2020-10-02 DIAGNOSIS — G8929 Other chronic pain: Secondary | ICD-10-CM | POA: Diagnosis not present

## 2020-10-02 DIAGNOSIS — I110 Hypertensive heart disease with heart failure: Secondary | ICD-10-CM | POA: Diagnosis not present

## 2020-10-02 DIAGNOSIS — I5032 Chronic diastolic (congestive) heart failure: Secondary | ICD-10-CM | POA: Diagnosis not present

## 2020-10-02 DIAGNOSIS — J449 Chronic obstructive pulmonary disease, unspecified: Secondary | ICD-10-CM | POA: Diagnosis not present

## 2020-10-02 DIAGNOSIS — E1169 Type 2 diabetes mellitus with other specified complication: Secondary | ICD-10-CM | POA: Diagnosis not present

## 2020-10-02 DIAGNOSIS — G9341 Metabolic encephalopathy: Secondary | ICD-10-CM | POA: Diagnosis not present

## 2020-10-02 DIAGNOSIS — E785 Hyperlipidemia, unspecified: Secondary | ICD-10-CM | POA: Diagnosis not present

## 2020-10-02 DIAGNOSIS — Z515 Encounter for palliative care: Secondary | ICD-10-CM | POA: Diagnosis not present

## 2020-10-02 DIAGNOSIS — M545 Low back pain, unspecified: Secondary | ICD-10-CM | POA: Diagnosis not present

## 2020-10-02 DIAGNOSIS — G3184 Mild cognitive impairment, so stated: Secondary | ICD-10-CM | POA: Diagnosis not present

## 2020-10-02 DIAGNOSIS — I2510 Atherosclerotic heart disease of native coronary artery without angina pectoris: Secondary | ICD-10-CM | POA: Diagnosis not present

## 2020-10-02 NOTE — Telephone Encounter (Signed)
Noted  

## 2020-10-02 NOTE — Telephone Encounter (Signed)
Michelle [name] called from [place] and went to the the patients house to do occupational therapy. Michelle [name] stated the patient declined to do anymore visit so she will be closing order for the patient. Any questions michelle's number [phone].

## 2020-10-05 ENCOUNTER — Telehealth: Payer: Self-pay

## 2020-10-05 DIAGNOSIS — I10 Essential (primary) hypertension: Secondary | ICD-10-CM

## 2020-10-05 MED ORDER — HYDRALAZINE HCL 25 MG PO TABS
ORAL_TABLET | ORAL | 2 refills | Status: DC
Start: 2020-10-05 — End: 2020-12-18

## 2020-10-05 NOTE — Telephone Encounter (Signed)
Has question regarding pill cutting for a pharmacist

## 2020-10-05 NOTE — Telephone Encounter (Signed)
Spoke with patient's son Michelle [name] who manages her medicatiokns.  Has been trying to cut the 50mg  tablet in half to give 25mg  in the morning but the tablets are crumbling.  Will send in 25mg  hydralazine tablets for patient.

## 2020-10-07 DIAGNOSIS — E1169 Type 2 diabetes mellitus with other specified complication: Secondary | ICD-10-CM | POA: Diagnosis not present

## 2020-10-07 DIAGNOSIS — G8929 Other chronic pain: Secondary | ICD-10-CM | POA: Diagnosis not present

## 2020-10-07 DIAGNOSIS — E785 Hyperlipidemia, unspecified: Secondary | ICD-10-CM | POA: Diagnosis not present

## 2020-10-07 DIAGNOSIS — G9341 Metabolic encephalopathy: Secondary | ICD-10-CM | POA: Diagnosis not present

## 2020-10-07 DIAGNOSIS — J449 Chronic obstructive pulmonary disease, unspecified: Secondary | ICD-10-CM | POA: Diagnosis not present

## 2020-10-07 DIAGNOSIS — I2510 Atherosclerotic heart disease of native coronary artery without angina pectoris: Secondary | ICD-10-CM | POA: Diagnosis not present

## 2020-10-07 DIAGNOSIS — I110 Hypertensive heart disease with heart failure: Secondary | ICD-10-CM | POA: Diagnosis not present

## 2020-10-07 DIAGNOSIS — M545 Low back pain, unspecified: Secondary | ICD-10-CM | POA: Diagnosis not present

## 2020-10-07 DIAGNOSIS — I5032 Chronic diastolic (congestive) heart failure: Secondary | ICD-10-CM | POA: Diagnosis not present

## 2020-10-09 ENCOUNTER — Ambulatory Visit (HOSPITAL_BASED_OUTPATIENT_CLINIC_OR_DEPARTMENT_OTHER): Payer: Medicare HMO | Attending: Cardiovascular Disease | Admitting: Cardiovascular Disease

## 2020-10-09 ENCOUNTER — Other Ambulatory Visit: Payer: Self-pay

## 2020-10-09 DIAGNOSIS — G4733 Obstructive sleep apnea (adult) (pediatric): Secondary | ICD-10-CM

## 2020-10-09 DIAGNOSIS — J449 Chronic obstructive pulmonary disease, unspecified: Secondary | ICD-10-CM

## 2020-10-09 DIAGNOSIS — I10 Essential (primary) hypertension: Secondary | ICD-10-CM | POA: Diagnosis not present

## 2020-10-09 DIAGNOSIS — R0602 Shortness of breath: Secondary | ICD-10-CM | POA: Diagnosis not present

## 2020-10-13 DIAGNOSIS — M545 Low back pain, unspecified: Secondary | ICD-10-CM | POA: Diagnosis not present

## 2020-10-13 DIAGNOSIS — I5032 Chronic diastolic (congestive) heart failure: Secondary | ICD-10-CM | POA: Diagnosis not present

## 2020-10-13 DIAGNOSIS — J449 Chronic obstructive pulmonary disease, unspecified: Secondary | ICD-10-CM | POA: Diagnosis not present

## 2020-10-13 DIAGNOSIS — E785 Hyperlipidemia, unspecified: Secondary | ICD-10-CM | POA: Diagnosis not present

## 2020-10-13 DIAGNOSIS — I2510 Atherosclerotic heart disease of native coronary artery without angina pectoris: Secondary | ICD-10-CM | POA: Diagnosis not present

## 2020-10-13 DIAGNOSIS — G8929 Other chronic pain: Secondary | ICD-10-CM | POA: Diagnosis not present

## 2020-10-13 DIAGNOSIS — G9341 Metabolic encephalopathy: Secondary | ICD-10-CM | POA: Diagnosis not present

## 2020-10-13 DIAGNOSIS — E1169 Type 2 diabetes mellitus with other specified complication: Secondary | ICD-10-CM | POA: Diagnosis not present

## 2020-10-13 DIAGNOSIS — I110 Hypertensive heart disease with heart failure: Secondary | ICD-10-CM | POA: Diagnosis not present

## 2020-10-14 ENCOUNTER — Ambulatory Visit: Payer: Medicare HMO | Admitting: Neurology

## 2020-10-14 ENCOUNTER — Other Ambulatory Visit: Payer: Self-pay

## 2020-10-14 DIAGNOSIS — R404 Transient alteration of awareness: Secondary | ICD-10-CM | POA: Diagnosis not present

## 2020-10-14 DIAGNOSIS — R9401 Abnormal electroencephalogram [EEG]: Secondary | ICD-10-CM

## 2020-10-14 DIAGNOSIS — R413 Other amnesia: Secondary | ICD-10-CM | POA: Diagnosis not present

## 2020-10-15 ENCOUNTER — Other Ambulatory Visit: Payer: Medicare HMO | Admitting: Obstetrics & Gynecology

## 2020-10-15 ENCOUNTER — Other Ambulatory Visit: Payer: Medicare HMO

## 2020-10-16 ENCOUNTER — Other Ambulatory Visit: Payer: Self-pay | Admitting: Family Medicine

## 2020-10-22 ENCOUNTER — Encounter: Payer: Self-pay | Admitting: Family Medicine

## 2020-10-22 ENCOUNTER — Other Ambulatory Visit: Payer: Self-pay

## 2020-10-22 ENCOUNTER — Ambulatory Visit (INDEPENDENT_AMBULATORY_CARE_PROVIDER_SITE_OTHER): Payer: Medicare HMO | Admitting: Family Medicine

## 2020-10-22 VITALS — BP 130/70 | HR 80 | Temp 98.4°F | Ht 65.0 in | Wt 175.0 lb

## 2020-10-22 DIAGNOSIS — I5032 Chronic diastolic (congestive) heart failure: Secondary | ICD-10-CM | POA: Diagnosis not present

## 2020-10-22 DIAGNOSIS — I10 Essential (primary) hypertension: Secondary | ICD-10-CM | POA: Diagnosis not present

## 2020-10-22 DIAGNOSIS — I2510 Atherosclerotic heart disease of native coronary artery without angina pectoris: Secondary | ICD-10-CM

## 2020-10-22 DIAGNOSIS — I110 Hypertensive heart disease with heart failure: Secondary | ICD-10-CM | POA: Diagnosis not present

## 2020-10-22 DIAGNOSIS — E785 Hyperlipidemia, unspecified: Secondary | ICD-10-CM

## 2020-10-22 DIAGNOSIS — J449 Chronic obstructive pulmonary disease, unspecified: Secondary | ICD-10-CM | POA: Diagnosis not present

## 2020-10-22 DIAGNOSIS — N183 Chronic kidney disease, stage 3 unspecified: Secondary | ICD-10-CM | POA: Diagnosis not present

## 2020-10-22 DIAGNOSIS — M545 Low back pain, unspecified: Secondary | ICD-10-CM | POA: Diagnosis not present

## 2020-10-22 DIAGNOSIS — F0390 Unspecified dementia without behavioral disturbance: Secondary | ICD-10-CM | POA: Insufficient documentation

## 2020-10-22 DIAGNOSIS — E1169 Type 2 diabetes mellitus with other specified complication: Secondary | ICD-10-CM

## 2020-10-22 DIAGNOSIS — R413 Other amnesia: Secondary | ICD-10-CM | POA: Insufficient documentation

## 2020-10-22 DIAGNOSIS — G8929 Other chronic pain: Secondary | ICD-10-CM | POA: Diagnosis not present

## 2020-10-22 DIAGNOSIS — G9341 Metabolic encephalopathy: Secondary | ICD-10-CM | POA: Diagnosis not present

## 2020-10-22 NOTE — Patient Instructions (Addendum)
Health Maintenance Due  Topic Date Due  . OPHTHALMOLOGY EXAM - schedule diabetic eye exam and have them send me a copy 04/28/2018   PHQ9 before you leave with team   Recommended follow up: Return in about 4 months (around 02/21/2021) for physical or sooner if needed.   We will give [facility] a call tomorrow so that we can get you scheduled for the Ultrasound that you need. Then I will give you a call once I speak with them and inform you on the appointment that we got for you and if that doesn't fit your schedule then you can give them a call.

## 2020-10-22 NOTE — Progress Notes (Signed)
Phone [phone] In person visit   Subjective:   Michelle [name] is a 87 y.o. year old very pleasant female patient who presents for/with See problem oriented charting Chief Complaint  Patient presents with  . Hypertension    Has been in the normal range, was 115/67 this morning. Has had a couple low ones. Normal ones have been running between 130's/75  . Blood Work    Had blood work done about a month ago, and did not hear anything about results.    This visit occurred during the SARS-CoV-2 public health emergency.  Safety protocols were in place, including screening questions prior to the visit, additional usage of staff PPE, and extensive cleaning of exam room while observing appropriate contact time as indicated for disinfecting solutions.   Past Medical History-  Patient Active Problem List   Diagnosis Date Noted  . Memory loss 10/22/2020    Priority: High  . Delirium 06/25/2020    Priority: High  . Chronic diastolic CHF (congestive heart failure) ([place]) 03/13/2014    Priority: High  . CAD- RCA PCI '90s, STEMI-RCA DES 02/18/14 03/05/2014    Priority: High  . Type 2 diabetes mellitus without complication, with long-term current use of insulin ([place]) 03/26/2007    Priority: High  . Anemia, iron deficiency 10/23/2015    Priority: Medium  . CKD (chronic kidney disease), stage III ([place]) 07/25/2014    Priority: Medium  . Osteoarthritis, knee 05/13/2014    Priority: Medium  . Anxiety state 04/21/2014    Priority: Medium  . SOB (shortness of breath) 03/05/2014    Priority: Medium  . Hyperlipidemia associated with type 2 diabetes mellitus ([place]) 03/26/2007    Priority: Medium  . Essential hypertension 04/21/2006    Priority: Medium  . COPD (chronic obstructive pulmonary disease) ([place]) 04/21/2006    Priority: Medium  . Actinic keratosis 11/17/2017    Priority: Low  . Back pain, lumbosacral 10/10/2012    Priority: Low    Medications- reviewed and updated Current  Outpatient Medications  Medication Sig Dispense Refill  . ACCU-CHEK FASTCLIX LANCETS MISC     . acetaminophen (TYLENOL) 500 MG tablet Take 1,000 mg by mouth See admin instructions. Takes 1 tablet (500 mg totally) by mouth in the morning; takes 1 tablet (500 mg) in the afternoon and bedtime as needed for pain    . albuterol (PROVENTIL) (2.5 MG/3ML) 0.083% nebulizer solution USE THREE MILLILITERS VIA NEBULIZATION BY MOUTH EVERY 6 HOURS AS NEEDED FOR WHEEZING OR SHORTNESS OF BREATH (Patient taking differently: Take 2.5 mg by nebulization every 6 (six) hours as needed for wheezing or shortness of breath.) 75 mL 3  . albuterol (VENTOLIN HFA) 108 (90 Base) MCG/ACT inhaler Inhale 1 puff into the lungs every 4 (four) hours as needed for wheezing or shortness of breath (RESCUE inhaler if advair not working). 18 g 5  . Alcohol Swabs (B-D SINGLE USE SWABS REGULAR) PADS USE  FOR  TESTING THREE TIMES DAILY 300 each 1  . amLODipine (NORVASC) 10 MG tablet TAKE 1 TABLET EVERY DAY (Patient taking differently: Take 10 mg by mouth daily.) 90 tablet 3  . aspirin 81 MG tablet Take 81 mg by mouth daily.     . Blood Glucose Calibration (ACCU-CHEK SMARTVIEW CONTROL) LIQD     . blood glucose meter kit and supplies KIT Dispense based on patient and insurance preference. Use up to four times daily as directed. (FOR ICD-9 250.00, 250.01). 1 each 0  . calcium carbonate (OSCAL) 1500 (  600 Ca) MG TABS tablet Take 600 mg of elemental calcium by mouth daily with breakfast.    . carvedilol (COREG) 12.5 MG tablet Take 1 tablet (12.5 mg total) by mouth 2 (two) times daily. 60 tablet 3  . cholecalciferol (VITAMIN D3) 25 MCG (1000 UNIT) tablet Take 1,000 Units by mouth daily.    . clopidogrel (PLAVIX) 75 MG tablet TAKE ONE TABLET BY MOUTH DAILY **PLEASE KEEP UPCOMING APPOINTMENT IN NOVEMBER WITH DOCTOR [facility] FOR FUTURE REFILLS 90 tablet 1  . diclofenac Sodium (VOLTAREN) 1 % GEL Apply 2 g topically 4 (four) times daily. 350 g 3  .  DROPLET PEN NEEDLES 31G X 8 MM MISC USE AS DIRECTED WITH LANTUS SOLOSTAR 300 each 1  . enalapril (VASOTEC) 20 MG tablet TAKE 1 TABLET TWICE DAILY (Patient taking differently: Take 20 mg by mouth 2 (two) times daily.) 180 tablet 3  . fenofibrate micronized (LOFIBRA) 134 MG capsule Take 1 capsule (134 mg total) by mouth daily. 90 capsule 3  . ferrous sulfate 325 (65 FE) MG tablet Twice a week (Patient taking differently: Take 325 mg by mouth See admin instructions. Takes 1 tablet (325 mg totally) by mouth every Mon and Friday) 18 tablet 3  . Fluticasone-Salmeterol (ADVAIR) 250-50 MCG/DOSE AEPB INHALE 1 PUFF TWICE DAILY 268 each 3  . folic acid (FOLVITE) 341 MCG tablet Take 400 mcg by mouth daily.    . furosemide (LASIX) 20 MG tablet Take 1 tablet (20 mg total) by mouth daily. 30 tablet 3  . glucose blood (ACCU-CHEK SMARTVIEW) test strip TEST BLOOD SUGAR THREE TIMES DAILY 300 strip 1  . hydrALAZINE (APRESOLINE) 25 MG tablet Take 1 tablet in the morning as needed for high blood pressure 30 tablet 2  . hydrALAZINE (APRESOLINE) 50 MG tablet Take 1 tablet (50 mg total) by mouth every 8 (eight) hours. 90 tablet 3  . icosapent Ethyl (VASCEPA) 1 g capsule Take 2 capsules (2 g total) by mouth 2 (two) times daily. 360 capsule 3  . insulin glargine (LANTUS SOLOSTAR) 100 UNIT/ML Solostar Pen INJECT  17  TO  20 UNITS SUBCUTANEOUSLY EVERY MORNING (Patient taking differently: Inject 18 Units into the skin daily.) 90 mL 3  . Melatonin 5 MG CAPS Take 1 capsule by mouth at bedtime.     . metFORMIN (GLUCOPHAGE) 1000 MG tablet TAKE 1 TABLET TWICE DAILY 180 tablet 1  . nitroGLYCERIN (NITROSTAT) 0.4 MG SL tablet PLACE 1 TABLET (0.4 MG TOTAL) UNDER THE TONGUE EVERY 5 (FIVE) MINUTESAS NEEDED FOR CHEST PAIN (CHEST PAIN OR SHORTNESS OF BREATH). (Patient taking differently: Place 0.4 mg under the tongue every 5 (five) minutes as needed for chest pain (max 3 doses).) 25 tablet 6  . pantoprazole (PROTONIX) 40 MG tablet TAKE ONE  TABLET BY MOUTH DAILY 90 tablet 1  . rosuvastatin (CRESTOR) 40 MG tablet TAKE 1 TABLET EVERY DAY (Patient taking differently: Take 40 mg by mouth daily.) 90 tablet 2  . spironolactone (ALDACTONE) 25 MG tablet Take 1 tablet (25 mg total) by mouth daily. 90 tablet 3  . tiotropium (SPIRIVA HANDIHALER) 18 MCG inhalation capsule Place 1 capsule (18 mcg total) into inhaler and inhale daily. 90 capsule 2  . vitamin B-12 (CYANOCOBALAMIN) 1000 MCG tablet Take 1,000 mcg by mouth daily.    . vitamin E 400 UNIT capsule Take 400 Units by mouth at bedtime.    . sertraline (ZOLOFT) 50 MG tablet Take 2 tablets (100 mg total) by mouth at bedtime. (Patient taking differently: Take  100 mg by mouth daily.) 60 tablet 0   No current facility-administered medications for this visit.     Objective:  BP 130/70   Pulse 80   Temp 98.4 F (36.9 C) (Temporal)   Ht '5\' 5"'  (1.651 m)   Wt 175 lb (79.4 kg)   SpO2 95%   BMI 29.12 kg/m  Gen: NAD, resting comfortably CV: RRR no murmurs rubs or gallops Lungs: CTAB no crackles, wheeze, rhonchi Ext: no edema Skin: warm, dry    Assessment and Plan   #History of delirium with concern for underlying dementia-patient had a visit with neurology Dr. Delice Lesch and MoCA score was significantly decreased at 14 out of 30-plan is for 6 to 15-monthfollow-up.  We discussed potential diagnosis of dementia.  Patient and son think her memory is improving as she gets further out from hospitalization-we will await opinion of Dr. ADelice Leschat follow-up -EEG was reassuring  #Osteoarthritis of the knees-if continues have pain in her knees but does not think pain is any significantly worse since coming off of tramadol.  Using a combination of Voltaren gel and Tylenol arthritis  #CAD  #hyperlipidemia S: Medication: Plavix 75 mg, aspirin 81 mg, rosuvastatin 40 mg No chest pain or worsening shortness of breath reported  A/P: CAD appears stable.  Hyperlipidemia well-controlled with LDL under 70.   Continue current medications for both  #Resistant hypertension S: medication: amlodipine 10 mg, carvedilol 12.5 mg BID, enalapril 20 mg twice daily, lasix 20 mg daily, hydralazine 25 mg Am with 50 mg every 8 hours after, spironolactone 25 mg BP Readings from Last 3 Encounters:  10/22/20 130/70  10/01/20 126/60  09/22/20 130/73  A/P: Well-controlled but on multiple medications -We are still try to get renal imaging done to evaluate for renal artery stenosis-our team is going to call and check on this tomorrow-their office was closed we attempted to call this evening  # Diabetes S: Medication: lantus 18 units plus metformin 10035mtwice daily -Maybe every few weeks gets shaky and feels warm- has not checked sugar yet-I encouraged patient to check Lab Results  Component Value Date   HGBA1C 6.5 (H) 08/26/2020   HGBA1C 6.6 (H) 06/25/2020   HGBA1C 6.6 (H) 02/18/2020   A/P: Doing well-continue current medication-update A1c at 4-22-monthysical  #Chronic diastolic CHF S: Continues to do well despite being on amlodipine.  This may be counterbalanced by spironolactone and Lasix 20 mg in regards to edema-none noted today.  Weight stable over the last month A/P: Doing well-continue current medication  #Postmenopausal bleeding-patient has seen gynecology but appears still pending pelvic ultrasound-looks like this is scheduled next month  #B12 deficiency-patient maintains oral B12   # Depression S: Medication: Zoloft 100 mg-taking 2 of the 50 mg tablets [facility] 10/22/2020 09/04/2020 05/25/2020  Decreased Interest 0 - 0  Down, Depressed, Hopeless 0 3 0  PHQ - 2 Score 0 3 0  Altered sleeping 1 1 -  Tired, decreased energy 1 3 -  Change in appetite 0 0 -  Feeling bad or failure about yourself  0 0 -  Trouble concentrating 0 3 -  Moving slowly or fidgety/restless 0 3 -  Suicidal thoughts 0 0 -  PHQ-9 Score 2 13 -  Difficult doing work/chores Not difficult at all Not difficult at  all -  Some recent data might be hidden   A/P: Depression has improved as she gets further out from hospitalization-in full remission.  Offered to send in  100 mg tablet but declines for now   Recommended follow up: Return in about 4 months (around 02/21/2021) for physical or sooner if needed. Future Appointments  Date Time Provider [place]  11/03/2020  2:30 PM [facility]LINE PHARMACIST [facility]  11/17/2020  3:00 PM [facility]-GY[facility] CTR [place] RM 1 GCG-[facility] None  11/17/2020  3:30 PM [name], MD GCG-[facility] None  04/06/2021 11:30 AM [name], MD LBN-LB[facility] None  06/07/2021  2:30 PM LBPC-[facility] HEALTH COACH [facility] PEC   Lab/Order associations:   ICD-10-CM   1. Chronic diastolic CHF (congestive heart failure) (HCC)  I50.32   2. Memory loss  R41.3   3. Coronary artery disease involving native coronary artery of native heart without angina pectoris  I25.10   4. Hyperlipidemia associated with type 2 diabetes mellitus ([place])  E11.69    E78.5   5. Essential hypertension  I10   6. Stage 3 chronic kidney disease, unspecified whether stage 3a or 3b CKD ([place])  N18.30    Return precautions advised.  [name], MD

## 2020-10-23 ENCOUNTER — Telehealth: Payer: Self-pay

## 2020-10-23 ENCOUNTER — Other Ambulatory Visit: Payer: Self-pay | Admitting: Family Medicine

## 2020-10-23 NOTE — Telephone Encounter (Signed)
I contacted the patient today to make her aware of [place] appointment date/time I scheduled for her and she requested I give all of the information to her son- I  gave him the address  I gave him all of the instructions for the prep on this procedure: Eat lightly starting at noon the day before [place] no dairy after noon the day before Do not void an hour before the appointment Bring a light snack because the process can take 1 1/2 hr If pt needs to cancel they will hv to give 24 hr notice or there will be a $75 fee

## 2020-10-25 ENCOUNTER — Encounter (HOSPITAL_BASED_OUTPATIENT_CLINIC_OR_DEPARTMENT_OTHER): Payer: Self-pay | Admitting: Cardiovascular Disease

## 2020-10-25 NOTE — Procedures (Signed)
    Patient Name: Michelle [name], Michelle [name] Date: 10/09/2020 Gender: Female D.O.B: May 01, 1933 Age (years): 29 Referring Provider: Skeet Latch Height (inches): 108 Interpreting Physician: Shelva Majestic MD, ABSM Weight (lbs): 175 RPSGT: Jacolyn Reedy BMI: 29 MRN: [id number] Neck Size:   CLINICAL INFORMATION Sleep Study Type: HST  Indication for sleep study: N/A  Epworth Sleepiness Score: N/A  SLEEP STUDY TECHNIQUE A multi-channel overnight portable sleep study was performed. The channels recorded were: nasal airflow, thoracic respiratory movement, and oxygen saturation with a pulse oximetry. Snoring was also monitored.  MEDICATIONS Patient self administered medications include: N/A.  SLEEP ARCHITECTURE Patient was studied for 550.4 minutes. The sleep efficiency was 100.0 % and the patient was supine for 0%. The arousal index was 0.0 per hour.  RESPIRATORY PARAMETERS The overall AHI was 19.8 per hour, with a central apnea index of 0 per hour.  The oxygen nadir was 78% during sleep.  CARDIAC DATA Mean heart rate during sleep was 68.0 bpm.  IMPRESSIONS - Moderate obstructive sleep apnea occurred during this study (AHI 19.8/h). The severity of sleep apnea during REM sleep cannot be assessed on this home study. - Oxygen desaturation was noted during this study (although 78% was reported, question accuracy). - Patient snored 2.3% during the sleep.  DIAGNOSIS - Obstructive Sleep Apnea (G47.33)  RECOMMENDATIONS - Recommend a CPAP titration study for further assessment and treatment of the patient's sleep disordered breathing. If unable to obtain approval, consider Auto-PAP at 6-15 cm of water. - Effort should be made to optimize nasal and oropharyngeal patency.  - Avoid alcohol, sedatives and other CNS depressants that may worsen sleep apnea and disrupt normal sleep architecture. - Sleep hygiene should be reviewed to assess factors that may improve sleep quality. -  Weight management and regular exercise should be initiated or continued. - Return to Sleep Center to discuss the results of this study -  [Electronically signed] 10/25/2020 04:58 PM  Shelva Majestic MD, [facility], ABSM Diplomate, American Board of Sleep Medicine   NPI: [id number] [place] PH: [phone]   FX: [phone] [place]

## 2020-10-26 ENCOUNTER — Other Ambulatory Visit: Payer: Self-pay | Admitting: Cardiovascular Disease

## 2020-10-26 ENCOUNTER — Telehealth: Payer: Self-pay | Admitting: Neurology

## 2020-10-26 DIAGNOSIS — G4733 Obstructive sleep apnea (adult) (pediatric): Secondary | ICD-10-CM

## 2020-10-26 MED ORDER — DIVALPROEX SODIUM ER 250 MG PO TB24: ORAL_TABLET | ORAL | 11 refills | Status: DC

## 2020-10-26 NOTE — Telephone Encounter (Signed)
Spoke with patients son, he stated that she is unable to make her appointment that was originally scheduled for tomorrow because she had already eaten once GI called about the appointment. He is going to call them and reschedule the appointment when ready, let him know that Dr.Hunter strongly encourages her to get US done to rule out any possible issues.

## 2020-10-26 NOTE — Telephone Encounter (Signed)
Please advise 

## 2020-10-26 NOTE — Procedures (Signed)
ELECTROENCEPHALOGRAM REPORT  Dates of Recording: 10/14/2020 12:!4PM to 10/15/2020 12:17PM  Patient's Name: Michelle [name] MRN: [id number] Date of Birth: 07-22-1932  Referring Provider: Dr. Ellouise Newer  Procedure: 24-hour ambulatory video EEG  History: This is an 87 year old woman with a history of dementia with an episode of unresponsiveness/stiffening with sonorous respirations. EEG for classification  Medications:  Norvasc Coreg Plavix Sertraline Lasix  Technical Summary: This is a 24-hour multichannel digital video EEG recording measured by the international 10-20 system with electrodes applied with paste and impedances below 5000 ohms performed as portable with EKG monitoring.  The digital EEG was referentially recorded, reformatted, and digitally filtered in a variety of bipolar and referential montages for optimal display.    DESCRIPTION OF RECORDING: During maximal wakefulness, the background activity consisted of a symmetric 8 Hz posterior dominant rhythm which was reactive to eye opening.  There is occasional focal theta slowing over the left temporal region. There is rare independent focal theta slowing over the right temporal region. There were occasional left frontotemporal sharp wave seen.   During the recording, the patient progresses through wakefulness, drowsiness, and Stage 2 sleep. Similar focal slowing is seen over the left >> right temporal regions. There were occasional left frontotemporal discharges seen in sleep.   Events: There were no push button events  There were no electrographic seizures seen.  EKG lead was unremarkable.  IMPRESSION: This 24-hour ambulatory video EEG study is abnormal due to the presence of: 1. Occasional focal slowing over the left temporal region 2. Rare independent focal slowing over the right temporal region 3. Occasional left frontotemporal epileptiform discharges  CLINICAL CORRELATION of the above findings indicates focal  cerebral dysfunction over the bilateral temporal regions suggestive of underlying structural or physiologic abnormality. There is a tendency for seizures to arise from the left frontotemporal region. If further clinical questions remain, inpatient video EEG monitoring may be helpful.   Ellouise Newer, M.D.

## 2020-10-26 NOTE — Telephone Encounter (Signed)
Son called in stating that

## 2020-10-26 NOTE — Telephone Encounter (Signed)
Son called in stating that patient cancelled  US Renal Artery Stenosis.  States GI called informing patient not to eat for 24 hours, to drink plenty of water and not to urinate after midnight the night before.    Son stated that the patient could not follow these instructions.  I called GI.  They stated that patient only needed to not void 1 hour prior to procedure.  Stated that patient could have broth or liquids without pulp and dairy free from 12 noon the day prior, than nothing  after midnight.    I have informed son.  He states he will give GI a call if patient decided to go through with procedure but doubts patient will go through.  States patient did not understand why she needed to have this procedure done.   Son also states that he was to call with BP readings that were low.  States that on Saturday and Sunday there were two readings:  114/57 and 108/60.     Please give Son, Michelle [name] a call back in regard.

## 2020-10-26 NOTE — Telephone Encounter (Signed)
Spoke to son [name] about EEG showing occasional left frontotemporal epileptiform discharges. Recommend starting seizure medication. She does not have significant agitation or hallucinations with her dementia. Sometimes she gets "shaky." Discussed starting low dose Depakote ER 250mg  qhs, side effects discussed. All his questions were answered. F/u in 2-3 months, call for any changes.

## 2020-10-26 NOTE — Telephone Encounter (Signed)
We are doing the ultrasound because she has difficult to control blood pressure-this could be a sign that she has poor blood flow to her kidneys which could be an issue and we want to make sure that is not occurring-I would encourage her to follow through

## 2020-10-27 ENCOUNTER — Telehealth: Payer: Self-pay | Admitting: *Deleted

## 2020-10-27 ENCOUNTER — Other Ambulatory Visit: Payer: Medicare HMO

## 2020-10-27 NOTE — Telephone Encounter (Signed)
PA submitted to [facility] for CPAP titration.

## 2020-10-28 ENCOUNTER — Telehealth: Payer: Self-pay | Admitting: *Deleted

## 2020-10-28 DIAGNOSIS — G9341 Metabolic encephalopathy: Secondary | ICD-10-CM | POA: Diagnosis not present

## 2020-10-28 DIAGNOSIS — I110 Hypertensive heart disease with heart failure: Secondary | ICD-10-CM | POA: Diagnosis not present

## 2020-10-28 DIAGNOSIS — E1169 Type 2 diabetes mellitus with other specified complication: Secondary | ICD-10-CM | POA: Diagnosis not present

## 2020-10-28 DIAGNOSIS — I2510 Atherosclerotic heart disease of native coronary artery without angina pectoris: Secondary | ICD-10-CM | POA: Diagnosis not present

## 2020-10-28 DIAGNOSIS — J449 Chronic obstructive pulmonary disease, unspecified: Secondary | ICD-10-CM | POA: Diagnosis not present

## 2020-10-28 DIAGNOSIS — E785 Hyperlipidemia, unspecified: Secondary | ICD-10-CM | POA: Diagnosis not present

## 2020-10-28 DIAGNOSIS — I5032 Chronic diastolic (congestive) heart failure: Secondary | ICD-10-CM | POA: Diagnosis not present

## 2020-10-28 DIAGNOSIS — M545 Low back pain, unspecified: Secondary | ICD-10-CM | POA: Diagnosis not present

## 2020-10-28 DIAGNOSIS — G8929 Other chronic pain: Secondary | ICD-10-CM | POA: Diagnosis not present

## 2020-10-28 NOTE — Telephone Encounter (Signed)
Patient notified of HST results and recommendations. She hesitantly agreed to proceed with the CPAP titration study.

## 2020-10-29 ENCOUNTER — Telehealth: Payer: Self-pay | Admitting: *Deleted

## 2020-10-29 ENCOUNTER — Telehealth: Payer: Self-pay | Admitting: Neurology

## 2020-10-29 MED ORDER — LAMOTRIGINE 25 MG PO TABS: 25.0000 mg | ORAL_TABLET | Freq: Every evening | ORAL | 0 refills | Status: DC

## 2020-10-29 NOTE — Telephone Encounter (Signed)
Son, [name] notified of CPAP titration appointment .

## 2020-10-29 NOTE — Telephone Encounter (Signed)
Pls have him stop the Depakote. See how she feels over the next 3 days. If no more symptoms, would start a different seizure medication. Would do very low dose Lamotrigine 25mg  every night. Side effects may be dizziness and drowsiness, it is a very low dose, hopefully she tolerates it better. If he is okay with this, pls send in Rx, thanks

## 2020-10-29 NOTE — Telephone Encounter (Signed)
Called patiens son and informed him of Dr. Lars Masson recommendations. patients son would like the rx of Lamotrigine to be sent in and will have patient stop Depakote. Patients son will give [place] a call Monday to let us know how she is doing.

## 2020-10-29 NOTE — Telephone Encounter (Signed)
[name] called with concerns the patient is having side effects after taking her first dose of generic Depakote 250mg  ER yesterday. He said she had diarrhea over night and thought he was her mother this morning for awhile.   Please call.  Kristopher Oppenheim in [employer]

## 2020-10-30 DIAGNOSIS — R197 Diarrhea, unspecified: Secondary | ICD-10-CM | POA: Diagnosis not present

## 2020-10-30 DIAGNOSIS — Z515 Encounter for palliative care: Secondary | ICD-10-CM | POA: Diagnosis not present

## 2020-10-30 DIAGNOSIS — G3184 Mild cognitive impairment, so stated: Secondary | ICD-10-CM | POA: Diagnosis not present

## 2020-10-30 DIAGNOSIS — I5032 Chronic diastolic (congestive) heart failure: Secondary | ICD-10-CM | POA: Diagnosis not present

## 2020-11-03 ENCOUNTER — Ambulatory Visit (INDEPENDENT_AMBULATORY_CARE_PROVIDER_SITE_OTHER): Payer: Medicare HMO | Admitting: Pharmacist Clinician (PhC)/ Clinical Pharmacy Specialist

## 2020-11-03 ENCOUNTER — Other Ambulatory Visit: Payer: Self-pay

## 2020-11-03 DIAGNOSIS — I10 Essential (primary) hypertension: Secondary | ICD-10-CM | POA: Diagnosis not present

## 2020-11-03 NOTE — Progress Notes (Signed)
11/04/2020 Michelle [name] 06-27-1933 [id number]   HPI:  Michelle BUCZEK is a 87 y.o. female patient of Dr Oval Linsey, with a [place] below who presents today for hypertension clinic evaluation.  She was originally seen by Dr. Oval Linsey in February, at which time her pressure was 128/60.  In late January she had been admitted to [facility] after son found her unresponsive.  Blood pressure at that time was 186/81.  It was thought to be hypertensive encephalopathy and delerium, and improved with better BP control.  At that first visit Dr. Oval Linsey switched metoprolol to carvedilol.  A blood cortisol level was checked and came back WNL.  At follow up the following month she saw Dr. Evelene Croon PharmD, and again her office BP reading was WNL at 126/60  At a follow up visit with Karren Cobble PharmD in march her pressure contiued to be controlled at 126/60 - family was concerneda bout morning elevations in pressure, so it was offered that she could take an extra 25 mg hydralazine in the mornings as needed  Past Medical History: ASCVD Angioplasty RCA 1990, STEMI 2015 - completely occluded RCA w/aneurysm prior to angioplasty site  DM2   hyperlipidemia   CHF Chronic diastolic        Blood Pressure Goal:  130/80  Current Medications: amlodipine 10 mg (am), carvedilol 12.5 mg bid, enalapril 20 mg bid, furosemide 20 mg am, hydralazine 25 mg am, 50 mg mid-day, hs,  spironolactone 25 mg (am)  Social Hx: no tobacco, no alcohol, only decaf coffee and unsweet iced tea   Exercise: difficulty with mobility  Home BP readings: 19 morning readings average 126/64     13 evening readings average 131/66  Intolerances: codeine/morphine  Labs: 2/22:Na 137, K 4.1, Glu 141, BUN 29, SCr 0.93, GFR 56   Wt Readings from Last 3 Encounters:  11/03/20 179 lb 12.8 oz (81.6 kg)  10/22/20 175 lb (79.4 kg)  09/22/20 175 lb (79.4 kg)   BP Readings from Last 3 Encounters:  11/03/20 (!) 144/72  10/22/20 130/70  10/01/20 126/60    Pulse Readings from Last 3 Encounters:  11/03/20 62  10/22/20 80  10/01/20 70    Current Outpatient Medications  Medication Sig Dispense Refill  . ACCU-CHEK FASTCLIX LANCETS MISC     . ACCU-CHEK SMARTVIEW test strip TEST BLOOD SUGAR THREE TIMES DAILY 300 strip 1  . acetaminophen (TYLENOL) 500 MG tablet Take 1,000 mg by mouth See admin instructions. Takes 1 tablet (500 mg totally) by mouth in the morning; takes 1 tablet (500 mg) in the afternoon and bedtime as needed for pain    . albuterol (PROVENTIL) (2.5 MG/3ML) 0.083% nebulizer solution USE THREE MILLILITERS VIA NEBULIZATION BY MOUTH EVERY 6 HOURS AS NEEDED FOR WHEEZING OR SHORTNESS OF BREATH (Patient taking differently: Take 2.5 mg by nebulization every 6 (six) hours as needed for wheezing or shortness of breath.) 75 mL 3  . albuterol (VENTOLIN HFA) 108 (90 Base) MCG/ACT inhaler Inhale 1 puff into the lungs every 4 (four) hours as needed for wheezing or shortness of breath (RESCUE inhaler if advair not working). 18 g 5  . Alcohol Swabs (B-D SINGLE USE SWABS REGULAR) PADS USE  FOR  TESTING THREE TIMES DAILY 300 each 1  . amLODipine (NORVASC) 10 MG tablet TAKE 1 TABLET EVERY DAY (Patient taking differently: Take 10 mg by mouth daily.) 90 tablet 3  . aspirin 81 MG tablet Take 81 mg by mouth daily.     Marland Kitchen  Blood Glucose Calibration (ACCU-CHEK SMARTVIEW CONTROL) LIQD     . blood glucose meter kit and supplies KIT Dispense based on patient and insurance preference. Use up to four times daily as directed. (FOR ICD-9 250.00, 250.01). 1 each 0  . calcium carbonate (OSCAL) 1500 (600 Ca) MG TABS tablet Take 600 mg of elemental calcium by mouth daily with breakfast.    . carvedilol (COREG) 12.5 MG tablet Take 1 tablet (12.5 mg total) by mouth 2 (two) times daily. 60 tablet 3  . cholecalciferol (VITAMIN D3) 25 MCG (1000 UNIT) tablet Take 1,000 Units by mouth daily.    . clopidogrel (PLAVIX) 75 MG tablet TAKE ONE TABLET BY MOUTH DAILY **PLEASE KEEP  UPCOMING APPOINTMENT IN NOVEMBER WITH DOCTOR [facility] FOR FUTURE REFILLS 90 tablet 1  . diclofenac Sodium (VOLTAREN) 1 % GEL Apply 2 g topically 4 (four) times daily. 350 g 3  . DROPLET PEN NEEDLES 31G X 8 MM MISC USE AS DIRECTED WITH LANTUS SOLOSTAR 300 each 1  . enalapril (VASOTEC) 20 MG tablet TAKE 1 TABLET TWICE DAILY (Patient taking differently: Take 20 mg by mouth 2 (two) times daily.) 180 tablet 3  . fenofibrate micronized (LOFIBRA) 134 MG capsule Take 1 capsule (134 mg total) by mouth daily. 90 capsule 3  . ferrous sulfate 325 (65 FE) MG tablet Twice a week (Patient taking differently: Take 325 mg by mouth See admin instructions. Takes 1 tablet (325 mg totally) by mouth every Mon and Friday) 18 tablet 3  . Fluticasone-Salmeterol (ADVAIR) 250-50 MCG/DOSE AEPB INHALE 1 PUFF TWICE DAILY 321 each 3  . folic acid (FOLVITE) 224 MCG tablet Take 400 mcg by mouth daily.    . furosemide (LASIX) 20 MG tablet Take 1 tablet (20 mg total) by mouth daily. 30 tablet 3  . hydrALAZINE (APRESOLINE) 25 MG tablet Take 1 tablet in the morning as needed for high blood pressure 30 tablet 2  . hydrALAZINE (APRESOLINE) 50 MG tablet Take 1 tablet (50 mg total) by mouth every 8 (eight) hours. 90 tablet 3  . icosapent Ethyl (VASCEPA) 1 g capsule Take 2 capsules (2 g total) by mouth 2 (two) times daily. 360 capsule 3  . insulin glargine (LANTUS SOLOSTAR) 100 UNIT/ML Solostar Pen INJECT  17  TO  20 UNITS SUBCUTANEOUSLY EVERY MORNING (Patient taking differently: Inject 18 Units into the skin daily.) 90 mL 3  . lamoTRIgine (LAMICTAL) 25 MG tablet Take 1 tablet (25 mg total) by mouth at bedtime. 30 tablet 0  . Melatonin 5 MG CAPS Take 1 capsule by mouth at bedtime.     . metFORMIN (GLUCOPHAGE) 1000 MG tablet TAKE 1 TABLET TWICE DAILY 180 tablet 1  . nitroGLYCERIN (NITROSTAT) 0.4 MG SL tablet PLACE 1 TABLET (0.4 MG TOTAL) UNDER THE TONGUE EVERY 5 (FIVE) MINUTESAS NEEDED FOR CHEST PAIN (CHEST PAIN OR SHORTNESS OF BREATH).  (Patient taking differently: Place 0.4 mg under the tongue every 5 (five) minutes as needed for chest pain (max 3 doses).) 25 tablet 6  . pantoprazole (PROTONIX) 40 MG tablet TAKE ONE TABLET BY MOUTH DAILY 90 tablet 1  . rosuvastatin (CRESTOR) 40 MG tablet TAKE 1 TABLET EVERY DAY (Patient taking differently: Take 40 mg by mouth daily.) 90 tablet 2  . sertraline (ZOLOFT) 50 MG tablet Take 2 tablets (100 mg total) by mouth at bedtime. (Patient taking differently: Take 100 mg by mouth daily.) 60 tablet 0  . spironolactone (ALDACTONE) 25 MG tablet Take 1 tablet (25 mg total) by mouth daily.  90 tablet 3  . tiotropium (SPIRIVA HANDIHALER) 18 MCG inhalation capsule Place 1 capsule (18 mcg total) into inhaler and inhale daily. 90 capsule 2  . vitamin B-12 (CYANOCOBALAMIN) 1000 MCG tablet Take 1,000 mcg by mouth daily.    . vitamin E 400 UNIT capsule Take 400 Units by mouth at bedtime.     No current facility-administered medications for this visit.    Allergies  Allergen Reactions  . Codeine Sulfate Nausea Only  . Morphine Sulfate Nausea Only    Past Medical History:  Diagnosis Date  . Anginal pain ([place])   . Arthritis   . AV block, 1st degree   . CAD (coronary artery disease) 1990; 2015   Cardiac cath 1990 with Dr. Lia Foyer and [name] Residual CAD, non obstructive per 2015 cath. STEMI status post stent in August 2015.  Marland Kitchen COPD (chronic obstructive pulmonary disease) ([place])   . Diabetes mellitus type 2, insulin dependent ([place])   . Essential hypertension   . Hyperlipidemia with target LDL less than 70   . Myocardial infarction ([place])   . Pericarditis-post MI (short course of steroids) 03/06/2014  . 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 Shortness of breath     Blood  pressure (!) 144/72, pulse 62, resp. rate 16, height _0  (1.651 m), weight 179 lb 12.8 oz (81.6 kg), SpO2 95 %.  Essential hypertension Patient with essential hypertension, now stable on current medications.  No changes to current medications and answered all questions from patient and son.  She will be seeing Dr. Claiborne Billings in June for sleep study, will continue to monitor home readings.  She can reach out to office for any concerns regarding BP.     Tommy Medal PharmD CPP [facility] [address] [place], [place] [address] [phone]

## 2020-11-03 NOTE — Patient Instructions (Signed)
Return for a a follow up appointment with Dr. Claiborne Billings in June  Check your blood pressure at home daily and keep record of the readings.  Take your BP meds as follows:  Continue with all current medications  Bring all of your meds, your BP cuff and your record of home blood pressures to your next appointment.  Exercise as you're able, try to walk approximately 30 minutes per day.  Keep salt intake to a minimum, especially watch canned and prepared boxed foods.  Eat more fresh fruits and vegetables and fewer canned items.  Avoid eating in fast food restaurants.    HOW TO TAKE YOUR BLOOD PRESSURE: . Rest 5 minutes before taking your blood pressure. .  Don't smoke or drink caffeinated beverages for at least 30 minutes before. . Take your blood pressure before (not after) you eat. . Sit comfortably with your back supported and both feet on the floor (don't cross your legs). . Elevate your arm to heart level on a table or a desk. . Use the proper sized cuff. It should fit smoothly and snugly around your bare upper arm. There should be enough room to slip a fingertip under the cuff. The bottom edge of the cuff should be 1 inch above the crease of the elbow. . Ideally, take 3 measurements at one sitting and record the average.

## 2020-11-04 ENCOUNTER — Encounter: Payer: Self-pay | Admitting: Pharmacist Clinician (PhC)/ Clinical Pharmacy Specialist

## 2020-11-04 NOTE — Assessment & Plan Note (Signed)
Patient with essential hypertension, now stable on current medications.  No changes to current medications and answered all questions from patient and son.  She will be seeing Dr. Claiborne Billings in June for sleep study, will continue to monitor home readings.  She can reach out to office for any concerns regarding BP.

## 2020-11-09 ENCOUNTER — Other Ambulatory Visit: Payer: Self-pay | Admitting: Family Medicine

## 2020-11-11 ENCOUNTER — Other Ambulatory Visit: Payer: Self-pay | Admitting: Family Medicine

## 2020-11-12 ENCOUNTER — Other Ambulatory Visit: Payer: Self-pay

## 2020-11-12 MED ORDER — ACCU-CHEK FASTCLIX LANCETS MISC: 2 refills | Status: DC

## 2020-11-13 ENCOUNTER — Telehealth: Payer: Self-pay

## 2020-11-13 ENCOUNTER — Other Ambulatory Visit: Payer: Self-pay

## 2020-11-13 MED ORDER — ACCU-CHEK FASTCLIX LANCET KIT: PACK | 1 refills | Status: DC

## 2020-11-13 NOTE — Chronic Care Management (AMB) (Signed)
Chronic Care Management Pharmacy Assistant   Name: Michelle [name]  MRN: [id number] DOB: Jan 19, 1933  Reason for Encounter: Diabetes Mellitus Disease State Call   Recent office visits:  10/22/20- [name], MD- seen for hypertension and blood work, ultrasound ordered, follow up 4 months for physical 09/21/20- [name], MD- hx of delirium, visit noted  patient was given antibiotics but ultimately didn't have UTI- didn't do the best with the antbiotic due to diarrhea, visit noted referral  to [facility] orthopedics, visit noted polypharmacy better off tramadol, referral to palliative care, sleep study and ultrasound ordered  09/04/20- [name], [facility] visit follow up, Hx of delirium increased voltaren gel to qid , began weaning process of Seroquel 50 mg to completely stop after 2 weeks, referral to sports medicine, gynecology, and palliative care, labs ordered, visit noted tramadol is avoided due to delirium  Recent consult visits:  11/03/20- Rockne Menghini, RPH-CPP (Cardio)- essential hypertension, no medication changes 10/14/20-Karen Delice Lesch, MD( Neurology)- procedure visit for EEG for memory loss TE (04/11) started depakote er 250 mg TE (04/14) discontinued depakote due to diarrhea started lamotrigine 25 mg 10/09/20- Shelva Majestic, MD ( Sleep Med)- seen for sleep study, moderate OSA CPAP titration study recommended for further assessment, considering auto pap if unable to obtain approval 10/01/20- Rollen Sox, [facility] (Cardio)- seen for htn, visit noted patient may take  hydralazine  1m in morning if concerned BP is low, follow up 4 weeks 09/22/20- KEllouise Newer MD ( Neuro)- consult for memory loss, 24 Hr EEG ordered, follow up 6-7 months   09/15/20-[name] MD (GYN)- seen for postmenopausal bleeding,  Short course bactrim prescribed pending positive UA, no follow noted 09/03/20- TSkeet Latch MD ( Cardio)- started carvedilol 12.575m started   vascepa 2g  Bid, stopped fish oil,  discontinued metoprolol, [facility]  Medication Reconciliation was completed by comparing discharge summary, patient's EMR and Pharmacy list, and upon discussion with patient.  Admitted to the hospital on 08/12/2020 due to Acute metabolic encephalopathy. Discharge date was 09/01/2020. [facility] started hydralazine 50 mg tid  Started seroquel temporarily for agitation  Medication Changes at Hospital Discharge: Decreased furosemide 20 mg qd  Increased metroprolol 50 mg bid  Medications Discontinued at Hospital Discharge: Stopped tramadol due to delirium   Medications that remain the same after Hospital Discharge:??  All other medications will remain the same.    Medications: Outpatient Encounter Medications as of 11/13/2020  Medication Sig Note  . Accu-Chek FastClix Lancets MISC Use to test blood sugars daily. Dx: E11.9   . ACCU-CHEK SMARTVIEW test strip TEST BLOOD SUGAR THREE TIMES DAILY   . acetaminophen (TYLENOL) 500 MG tablet Take 1,000 mg by mouth See admin instructions. Takes 1 tablet (500 mg totally) by mouth in the morning; takes 1 tablet (500 mg) in the afternoon and bedtime as needed for pain 08/13/2020: Takes the same time with tramadol  . albuterol (PROVENTIL) (2.5 MG/3ML) 0.083% nebulizer solution USE THREE MILLILITERS VIA NEBULIZATION BY MOUTH EVERY 6 HOURS AS NEEDED FOR WHEEZING OR SHORTNESS OF BREATH (Patient taking differently: Take 2.5 mg by nebulization every 6 (six) hours as needed for wheezing or shortness of breath.)   . albuterol (VENTOLIN HFA) 108 (90 Base) MCG/ACT inhaler Inhale 1 puff into the lungs every 4 (four) hours as needed for wheezing or shortness of breath (RESCUE inhaler if advair not  working). 08/12/2020: LF at [facility] on 06/10/20. 33 Day Supply  . Alcohol Swabs (B-D SINGLE USE SWABS REGULAR) PADS  USE  FOR  TESTING THREE TIMES DAILY   . amLODipine (NORVASC) 10 MG tablet TAKE 1 TABLET EVERY DAY (Patient taking differently: Take 10 mg by mouth daily.) 08/12/2020: LF at [facility] on 06/17/20. 90 Day Supply   . aspirin 81 MG tablet Take 81 mg by mouth daily.    . Blood Glucose Calibration (ACCU-CHEK SMARTVIEW CONTROL) LIQD    . blood glucose meter kit and supplies KIT Dispense based on patient and insurance preference. Use up to four times daily as directed. (FOR ICD-9 250.00, 250.01).   . calcium carbonate (OSCAL) 1500 (600 Ca) MG TABS tablet Take 600 mg of elemental calcium by mouth daily with breakfast.   . carvedilol (COREG) 12.5 MG tablet Take 1 tablet (12.5 mg total) by mouth 2 (two) times daily.   . cholecalciferol (VITAMIN D3) 25 MCG (1000 UNIT) tablet Take 1,000 Units by mouth daily.   . clopidogrel (PLAVIX) 75 MG tablet TAKE ONE TABLET BY MOUTH DAILY **PLEASE KEEP UPCOMING APPOINTMENT IN NOVEMBER WITH DOCTOR [facility] FOR FUTURE REFILLS   . diclofenac Sodium (VOLTAREN) 1 % GEL Apply 2 g topically 4 (four) times daily.   . DROPLET PEN NEEDLES 31G X 8 MM MISC USE AS DIRECTED WITH LANTUS SOLOSTAR   . enalapril (VASOTEC) 20 MG tablet TAKE 1 TABLET TWICE DAILY   . fenofibrate micronized (LOFIBRA) 134 MG capsule Take 1 capsule (134 mg total) by mouth daily. 08/12/2020: LF at [facility] on 06/28/20. 90 Day Supply   . ferrous sulfate 325 (65 FE) MG tablet Twice a week (Patient taking differently: Take 325 mg by mouth See admin instructions. Takes 1 tablet (325 mg totally) by mouth every Mon and Friday)   . Fluticasone-Salmeterol (ADVAIR) 250-50 MCG/DOSE AEPB INHALE 1 PUFF TWICE DAILY   . folic acid (FOLVITE) 161 MCG tablet Take 400 mcg by mouth daily.   . furosemide (LASIX) 20 MG tablet Take 1 tablet (20 mg total) by mouth daily.   . hydrALAZINE (APRESOLINE) 25 MG tablet Take 1 tablet in the morning as needed for high blood pressure   . hydrALAZINE (APRESOLINE) 50 MG tablet Take 1 tablet (50 mg  total) by mouth every 8 (eight) hours.   Marland Kitchen icosapent Ethyl (VASCEPA) 1 g capsule Take 2 capsules (2 g total) by mouth 2 (two) times daily.   Marland Kitchen lamoTRIgine (LAMICTAL) 25 MG tablet Take 1 tablet (25 mg total) by mouth at bedtime.   . Lancets Misc. (ACCU-CHEK FASTCLIX LANCET) KIT 1 Device by Does not apply route. Use as directed for testing blood sugar   . Lancets Misc. (ACCU-CHEK FASTCLIX LANCET) KIT Use as directed to test blood sugar   . LANTUS SOLOSTAR 100 UNIT/ML Solostar Pen INJECT  17  TO  20 UNITS SUBCUTANEOUSLY EVERY MORNING   . Melatonin 5 MG CAPS Take 1 capsule by mouth at bedtime.    . metFORMIN (GLUCOPHAGE) 1000 MG tablet TAKE 1 TABLET TWICE DAILY   . nitroGLYCERIN (NITROSTAT) 0.4 MG SL tablet PLACE 1 TABLET (0.4 MG TOTAL) UNDER THE TONGUE EVERY 5 (FIVE) MINUTESAS NEEDED FOR CHEST PAIN (CHEST PAIN OR SHORTNESS OF BREATH). (Patient taking differently: Place 0.4 mg under the tongue every 5 (five) minutes as needed for chest pain (max 3 doses).)   . pantoprazole (PROTONIX) 40 MG tablet TAKE ONE TABLET BY MOUTH DAILY   . rosuvastatin (CRESTOR) 40 MG tablet TAKE 1  TABLET EVERY DAY (Patient taking differently: Take 40 mg by mouth daily.) 08/12/2020: LF at [facility] on 06/16/20. 90 Day Supply   . sertraline (ZOLOFT) 50 MG tablet Take 2 tablets (100 mg total) by mouth at bedtime. (Patient taking differently: Take 100 mg by mouth daily.) 08/12/2020: LF at [facility] on 08/07/20. 30 Day Supply   . spironolactone (ALDACTONE) 25 MG tablet Take 1 tablet (25 mg total) by mouth daily.   Marland Kitchen tiotropium (SPIRIVA HANDIHALER) 18 MCG inhalation capsule Place 1 capsule (18 mcg total) into inhaler and inhale daily.   . vitamin B-12 (CYANOCOBALAMIN) 1000 MCG tablet Take 1,000 mcg by mouth daily.   . vitamin E 400 UNIT capsule Take 400 Units by mouth at bedtime.    No facility-administered encounter medications on file as of 11/13/2020.    Recent Relevant Labs: Lab Results  Component Value Date/Time   HGBA1C 6.5 (H)  08/26/2020 04:10 PM   HGBA1C 6.6 (H) 06/25/2020 06:12 AM   MICROALBUR 0.7 01/07/2014 09:43 AM   MICROALBUR 0.2 07/08/2013 03:12 PM    Kidney Function Lab Results  Component Value Date/Time   CREATININE 0.93 09/04/2020 09:55 AM   CREATININE 0.86 09/01/2020 03:33 AM   CREATININE 0.98 (H) 07/03/2020 09:38 AM   CREATININE 0.81 07/01/2020 09:27 AM   GFR 55.98 (L) 09/04/2020 09:55 AM   GFRNONAA >60 09/01/2020 03:33 AM   GFRNONAA 66 07/01/2020 09:27 AM   GFRAA 77 07/01/2020 09:27 AM   Spoke with patient and patient's son. Ms. Leath was watching TV when her son answered the phone. I was able to briefly speak with her and she has been doing well. Her days consist of watching game shows on TV and doing puzzles.   Current antihyperglycemic regimen:  ? Lantus Solostar 100u/mL, inject 17 to 10 units under the skin every morning. ? Metformin 1000 mg tablet, one tablet twice daily with a meal.  What recent interventions/DTPs have been made to improve glycemic control:  [name] stated there have been no new interventions  Have there been any recent hospitalizations or ED visits since last visit with CPP? No  Patient denies hypoglycemic symptoms  Patient denies hyperglycemic symptoms  How often are you checking your blood sugar?] [name] stated Ms. Milbrath checks her BS daily  What are your blood sugars ranging?  o Fasting: 94 o Before meals: 115,110,102 o After meals: n/a o Bedtime: n/a  During the week, how often does your blood glucose drop below 70? Never  Are you checking your feet daily/regularly?  [name] stated he nor his mother check her feet. I did inform him of proper foot care for people with dm. He agreed to begin to regularly check his mothers feet.   Adherence Review: Is the patient currently on a STATIN medication? Yes Is the patient currently on ACE/ARB medication? Yes  Does the patient have >5 day gap between last estimated fill dates? Yes Metformin  1000 mg- 90 DS last filled 07/30/20 Lantus Solostar 100u/mL- 90 DS last filled 09/08/20  Star Rating Drugs: Rosuvastatin 40 mg- 90 DS last filled 09/16/20 Enalapril 20 mg - 90 DS lat filled 08/29/20  [facility] CPA, CMA

## 2020-11-13 NOTE — Telephone Encounter (Signed)
Pharmacy is calling requesting accu check fast clix device kit  For patient   [facility], [place]

## 2020-11-16 NOTE — Telephone Encounter (Signed)
Looks like this script was sent in 11/12/2020

## 2020-11-17 ENCOUNTER — Other Ambulatory Visit: Payer: Self-pay

## 2020-11-17 ENCOUNTER — Ambulatory Visit: Payer: Medicare HMO | Admitting: Obstetrics & Gynecology

## 2020-11-17 ENCOUNTER — Other Ambulatory Visit: Payer: Self-pay | Admitting: Obstetrics & Gynecology

## 2020-11-17 ENCOUNTER — Ambulatory Visit (INDEPENDENT_AMBULATORY_CARE_PROVIDER_SITE_OTHER): Payer: Medicare HMO

## 2020-11-17 DIAGNOSIS — N854 Malposition of uterus: Secondary | ICD-10-CM | POA: Diagnosis not present

## 2020-11-17 DIAGNOSIS — N83292 Other ovarian cyst, left side: Secondary | ICD-10-CM

## 2020-11-17 DIAGNOSIS — R1900 Intra-abdominal and pelvic swelling, mass and lump, unspecified site: Secondary | ICD-10-CM

## 2020-11-17 DIAGNOSIS — R9389 Abnormal findings on diagnostic imaging of other specified body structures: Secondary | ICD-10-CM | POA: Diagnosis not present

## 2020-11-17 DIAGNOSIS — N950 Postmenopausal bleeding: Secondary | ICD-10-CM | POA: Diagnosis not present

## 2020-11-17 NOTE — Progress Notes (Signed)
    Michelle [name] [birth year] [id number]        87 y.o.  G2P2   RP: PMB for Pelvic US  HPI: PMB about a month ago.  Mild to moderate flow.  No vaginal bleeding x >2 weeks.  No pelvic pain.  Urine/BMs wnl.   OB History  Gravida Para Term Preterm AB Living  2 2       2   SAB IAB Ectopic Multiple Live Births               # Outcome Date GA Lbr Len/2nd Weight Sex Delivery Anes PTL Lv  2 Para           1 Para             Past medical history,surgical history, problem list, medications, allergies, family history and social history were all reviewed and documented in the EPIC chart.   Directed ROS with pertinent positives and negatives documented in the history of present illness/assessment and plan.  Exam:  There were no vitals filed for this visit. General appearance:  Normal  Pelvic US today: Both T/V and T/A images necessary to evaluate the pelvic anatomy.  Small anteverted uterus measured at 6.64 x 3.95 x 3.93 cm with a 1.2 x 1 cm calcified fibroid.  Thickened endometrial line with cystic changes measured at 15.53 mm with a feeder vessel identified to the area of thickening.  Right ovary is small with a 1.1 cm simple cyst.  The left adnexa shows a large cystic mass with septations measured at 9.9 x 9.8 x 8.3 cm with 1 solid nodule along the cyst wall with vascularity.  No free fluid seen in the pelvis or abdomen.   Assessment/Plan:  87 y.o. G2P2   1. Postmenopausal bleeding PMB/Thickened Endometrium, probable endometrial Polyp with feeder vessel.  Endometrial Hyperplasia/Cancer not excluded.  2. Endometrial thickening on ultrasound PMB/Thickened Endometrium, probable endometrial Polyp with feeder vessel.  Endometrial Hyperplasia/Cancer not excluded.  3. Complex cyst of left ovary Incidental finding of a large Left Complex Ovarian Cyst 9.9 x 9.8 x 8.3 cm with septations and one solid nodule along the wall with vascularity in an 87 yo with h/o 2 MIs, COPD, cHTN,  Hypercholesterolemia, DM.  Refer to Gyneco-Oncology.  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  Decision to refer to [facility] for further investigation and management.  If not a candidate for surgery, could do Cytology/Bx of the Lt complex ovarian mass under CT guidance.  [name] MD, 3:57 PM 11/17/2020

## 2020-11-18 ENCOUNTER — Telehealth: Payer: Self-pay | Admitting: *Deleted

## 2020-11-18 ENCOUNTER — Encounter: Payer: Self-pay | Admitting: Obstetrics & Gynecology

## 2020-11-18 NOTE — Telephone Encounter (Signed)
-----   Message from [name], MD sent at 11/17/2020  4:02 PM EDT ----- Regarding: Refer to Gyneco-Onco PMB/Thickened Endometrium, probable endometrial Polyp with feeder vessel.  Incidental finding of a large Left Complex Ovarian Cyst 9.9 x 9.8 x 8.3 cm with septations and one solid nodule along the wall with vascularity.  Note that patient is 87 yo with h/o 2 MIs, COPD, cHTN, Hypercholesterolemia, DM.

## 2020-11-18 NOTE — Telephone Encounter (Signed)
Message sent to [facility] at Gyn-oncology she will call patient to schedule.

## 2020-11-19 ENCOUNTER — Telehealth: Payer: Self-pay | Admitting: *Deleted

## 2020-11-19 NOTE — Telephone Encounter (Signed)
Called and spoke with the patient's husband for 5/10 at 10:30 am with Dr Berline Lopes, patient given an arrival time of 10 am. Patient's husband given the address and phone number for the clinic.

## 2020-11-19 NOTE — Telephone Encounter (Signed)
Patient scheduled 5/10 with Dr Berline Lopes

## 2020-11-24 ENCOUNTER — Encounter: Payer: Self-pay | Admitting: Gynecologic Oncology

## 2020-11-24 ENCOUNTER — Inpatient Hospital Stay: Payer: Medicare HMO

## 2020-11-24 ENCOUNTER — Other Ambulatory Visit: Payer: Self-pay

## 2020-11-24 ENCOUNTER — Inpatient Hospital Stay: Payer: Medicare HMO | Attending: Gynecologic Oncology | Admitting: Gynecologic Oncology

## 2020-11-24 VITALS — BP 110/50 | HR 66 | Temp 98.6°F | Resp 20 | Ht 65.0 in | Wt 184.0 lb

## 2020-11-24 DIAGNOSIS — I5032 Chronic diastolic (congestive) heart failure: Secondary | ICD-10-CM | POA: Diagnosis not present

## 2020-11-24 DIAGNOSIS — N9489 Other specified conditions associated with female genital organs and menstrual cycle: Secondary | ICD-10-CM | POA: Insufficient documentation

## 2020-11-24 DIAGNOSIS — Z9049 Acquired absence of other specified parts of digestive tract: Secondary | ICD-10-CM | POA: Insufficient documentation

## 2020-11-24 DIAGNOSIS — Z79899 Other long term (current) drug therapy: Secondary | ICD-10-CM | POA: Insufficient documentation

## 2020-11-24 DIAGNOSIS — Z7982 Long term (current) use of aspirin: Secondary | ICD-10-CM | POA: Diagnosis not present

## 2020-11-24 DIAGNOSIS — R9389 Abnormal findings on diagnostic imaging of other specified body structures: Secondary | ICD-10-CM | POA: Insufficient documentation

## 2020-11-24 DIAGNOSIS — N950 Postmenopausal bleeding: Secondary | ICD-10-CM | POA: Insufficient documentation

## 2020-11-24 DIAGNOSIS — J449 Chronic obstructive pulmonary disease, unspecified: Secondary | ICD-10-CM | POA: Diagnosis not present

## 2020-11-24 DIAGNOSIS — D398 Neoplasm of uncertain behavior of other specified female genital organs: Secondary | ICD-10-CM | POA: Diagnosis not present

## 2020-11-24 DIAGNOSIS — I2510 Atherosclerotic heart disease of native coronary artery without angina pectoris: Secondary | ICD-10-CM | POA: Diagnosis not present

## 2020-11-24 DIAGNOSIS — Z794 Long term (current) use of insulin: Secondary | ICD-10-CM | POA: Insufficient documentation

## 2020-11-24 DIAGNOSIS — E119 Type 2 diabetes mellitus without complications: Secondary | ICD-10-CM | POA: Insufficient documentation

## 2020-11-24 DIAGNOSIS — N840 Polyp of corpus uteri: Secondary | ICD-10-CM | POA: Diagnosis not present

## 2020-11-24 DIAGNOSIS — I252 Old myocardial infarction: Secondary | ICD-10-CM | POA: Insufficient documentation

## 2020-11-24 DIAGNOSIS — I110 Hypertensive heart disease with heart failure: Secondary | ICD-10-CM | POA: Insufficient documentation

## 2020-11-24 LAB — CEA (IN HOUSE-CHCC): CEA (CHCC-In House): 1.53 ng/mL (ref 0.00–5.00)

## 2020-11-24 MED ORDER — TRAMADOL HCL 50 MG PO TABS: 50.0000 mg | ORAL_TABLET | Freq: Four times a day (QID) | ORAL | 0 refills | Status: DC | PRN

## 2020-11-24 MED ORDER — SENNOSIDES-DOCUSATE SODIUM 8.6-50 MG PO TABS: 2.0000 | ORAL_TABLET | Freq: Every day | ORAL | 0 refills | Status: DC

## 2020-11-24 NOTE — Progress Notes (Signed)
GYNECOLOGIC ONCOLOGY NEW PATIENT CONSULTATION   Patient Name: Michelle [name]  Patient Age: 87 y.o. Date of Service: 11/24/20 Referring Provider: [name] MD  Primary Care Provider: [name], MD Consulting Provider: Jeral Pinch, MD   Assessment/Plan:  Postmenopausal patient with complex adnexal mass and thickened endometrial lining in the setting of postmenopausal bleeding.  I discussed ultrasound findings with the patient and her son.  Given thickened lining in the setting of an episode of postmenopausal bleeding, I recommended endometrial biopsy.  On outside imaging, it appears that she may have a polyp with a feeder vessel.  Biopsy today will help determine what procedures may need to be performed at the time of surgery.  If benign pathology, then the patient would not necessarily need hysterectomy and less adnexal mass is malignant.  If this is the case, then I would recommend hysteroscopy with D&C.  In terms of the mass, we discussed features that make it complex.  Overall, my concern that this is a malignancy is low but given her postmenopausal age and imaging findings, I recommend surgery for diagnostic purposes.  She does not seem to be symptomatic at this time but she may become symptomatic if the mass grows.  We discussed the plan would be for robotic surgery with bilateral salpingo-oophorectomy.  I would send the mass for frozen section.  If no borderline or malignant pathology noted, then no additional surgery need to be performed.  In the setting of malignancy, we discussed that I recommend concurrent total hysterectomy as well as staging which would include lymph node biopsies and omentectomy.  We discussed the plan for a robotic assisted bilateral salpingo-oophorectomy, likely mini laparotomy for specimen removal, possible total hysterectomy, possible staging, possible hysteroscopy with D&C, possible laparotomy. The risks of surgery were discussed in detail and  she understands these to include infection; wound separation; hernia; vaginal cuff separation, injury to adjacent organs such as bowel, bladder, blood vessels, ureters and nerves; bleeding which may require blood transfusion; anesthesia risk; thromboembolic events; possible death; unforeseen complications; possible need for re-exploration; medical complications such as heart attack, stroke, pleural effusion and pneumonia; and, if full lymphadenectomy is performed the risk of lymphedema and lymphocyst. The patient will receive DVT and antibiotic prophylaxis as indicated. She voiced a clear understanding. She had the opportunity to ask questions. Perioperative instructions were reviewed with her. Prescriptions for post-op medications were sent to her pharmacy of choice.  A copy of this note was sent to the patient's referring provider.   65 minutes of total time was spent for this patient encounter, including preparation, face-to-face counseling with the patient and coordination of care, and documentation of the encounter.  Jeral Pinch, MD  Division of Gynecologic Oncology  Department of Obstetrics and Gynecology  [facility]  ___________________________________________  Chief Complaint: Chief Complaint  Patient presents with  . Adnexal mass  . Thickened endometrium    History of Present Illness:  Michelle [name] is a 87 y.o. y.o. female who is seen in consultation at the request of Dr. Dellis Filbert for an evaluation of a complex adnexal mass.  The patient was initially seen by Dr. Dellis Filbert on 3/1 after 5 days of PMB.  This was the first postmenopausal bleeding that she remembers having.  Today, she is here with her son Michelle [name].  He thinks this bleeding occurred after her hospitalization earlier this year while the patient thinks that it was just before she was admitted in February.  They both  endorse that it lasted no more than a week.  There was not much bleeding but was  bright red and required her to wear a pad.  She has had no bleeding since.  She denies any pelvic pain, cramping, or abdominal pain.  She has had some intermittent diarrhea since her hospitalization although thinks that maybe she developed this before.  She endorses a good appetite without nausea or emesis.  She denies any abdominal bloating or early satiety.  Denies any urinary symptoms.  Her past medical history is notable for heart disease including chronic diastolic CHF, CAD with prior MI, poorly controlled hypertension, and type 2 diabetes mellitus that is insulin-dependent.  She checks her sugars at home and her son states that these usually run between 105 and 110.  Her last hemoglobin A1c in February of this year was 6.5%.  She was recently admitted in February with acute metabolic encephalopathy (suspected hypertensive encephalopathy).  She denies any chest pain with ambulation.  She has some shortness of breath with movement.  She uses a walker with a seat to ambulate around the house.  She lives in [place] with her son.  She has a history of tobacco use but quit in 2008.  PAST MEDICAL HISTORY:  Past Medical History:  Diagnosis Date  . Anginal pain ([place])   . Arthritis   . AV block, 1st degree   . CAD (coronary artery disease) 1990; 2015   Cardiac cath 1990 with Dr. Lia Foyer and [name] Residual CAD, non obstructive per 2015 cath. STEMI status post stent in August 2015.  Marland Kitchen COPD (chronic obstructive pulmonary disease) ([place])   . Diabetes mellitus type 2, insulin dependent ([place])   . Essential hypertension   . Hyperlipidemia with target LDL less than 70   . Myocardial infarction ([place])   . Pericarditis-post MI (short course of steroids) 03/06/2014  . 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 Shortness of breath      PAST SURGICAL HISTORY:  Past Surgical History:  Procedure Laterality Date  . CATARACT EXTRACTION  2020   right and left eye   . CHOLECYSTECTOMY     thinks her appendix was removed at the same time  . CORONARY ANGIOPLASTY WITH STENT PLACEMENT  02/18/14   Promus DES to RCA  . LEFT HEART CATHETERIZATION WITH CORONARY ANGIOGRAM N/A 02/18/2014   Procedure: LEFT HEART CATHETERIZATION WITH CORONARY ANGIOGRAM;  Surgeon: Jettie Booze, MD;  Location: [facility] CATH LAB;  Service: Cardiovascular;  Laterality: N/A;  . PTCA  1990   PTCA of RCA  . TRANSTHORACIC ECHOCARDIOGRAM  02/02/2012   mild LVH, EF 55-60%, Normal WM, Gr 1 DD; Mild MR    OB/GYN HISTORY:  OB History  Gravida Para Term Preterm AB Living  _0 SAB IAB Ectopic Multiple Live Births               # Outcome Date GA Lbr Len/2nd Weight Sex Delivery Anes PTL Lv  2 Para           1 Para             No LMP recorded. Patient is postmenopausal.  Age at menarche: Does not remember Age  at menopause: In her 30s Hx of HRT: denies Hx of STDs: Denies Last pap: Unsure, but a number of years ago History of abnormal pap smears: Denies  SCREENING STUDIES:  Last mammogram: Unsure when her last was  Last colonoscopy: 2011  MEDICATIONS: Outpatient Encounter Medications as of 11/24/2020  Medication Sig  . Accu-Chek FastClix Lancets MISC Use to test blood sugars daily. Dx: E11.9  . ACCU-CHEK SMARTVIEW test strip TEST BLOOD SUGAR THREE TIMES DAILY  . acetaminophen (TYLENOL) 650 MG CR tablet Take 650 mg by mouth every 8 (eight) hours as needed for pain.  Marland Kitchen albuterol (PROVENTIL) (2.5 MG/3ML) 0.083% nebulizer solution USE THREE MILLILITERS VIA NEBULIZATION BY MOUTH EVERY 6 HOURS AS NEEDED FOR WHEEZING OR SHORTNESS OF BREATH (Patient taking differently: Take 2.5 mg by nebulization every 6 (six) hours as needed for wheezing or shortness of breath.)  . albuterol (VENTOLIN HFA) 108 (90  Base) MCG/ACT inhaler Inhale 1 puff into the lungs every 4 (four) hours as needed for wheezing or shortness of breath (RESCUE inhaler if advair not working).  . Alcohol Swabs (B-D SINGLE USE SWABS REGULAR) PADS USE  FOR  TESTING THREE TIMES DAILY  . amLODipine (NORVASC) 10 MG tablet TAKE 1 TABLET EVERY DAY (Patient taking differently: Take 10 mg by mouth daily.)  . aspirin 81 MG tablet Take 81 mg by mouth daily.   . Blood Glucose Calibration (ACCU-CHEK SMARTVIEW CONTROL) LIQD   . blood glucose meter kit and supplies KIT Dispense based on patient and insurance preference. Use up to four times daily as directed. (FOR ICD-9 250.00, 250.01).  . calcium carbonate (OSCAL) 1500 (600 Ca) MG TABS tablet Take 600 mg of elemental calcium by mouth daily with breakfast.  . carvedilol (COREG) 12.5 MG tablet Take 1 tablet (12.5 mg total) by mouth 2 (two) times daily.  . cholecalciferol (VITAMIN D3) 25 MCG (1000 UNIT) tablet Take 1,000 Units by mouth daily.  . clopidogrel (PLAVIX) 75 MG tablet TAKE ONE TABLET BY MOUTH DAILY **PLEASE KEEP UPCOMING APPOINTMENT IN NOVEMBER WITH DOCTOR [facility] FOR FUTURE REFILLS  . diclofenac Sodium (VOLTAREN) 1 % GEL Apply 2 g topically 4 (four) times daily.  . DROPLET PEN NEEDLES 31G X 8 MM MISC USE AS DIRECTED WITH LANTUS SOLOSTAR  . enalapril (VASOTEC) 20 MG tablet TAKE 1 TABLET TWICE DAILY  . fenofibrate micronized (LOFIBRA) 134 MG capsule Take 1 capsule (134 mg total) by mouth daily.  . ferrous sulfate 325 (65 FE) MG tablet Twice a week (Patient taking differently: Take 325 mg by mouth See admin instructions. Takes 1 tablet (325 mg totally) by mouth every Mon and Friday)  . Fluticasone-Salmeterol (ADVAIR) 250-50 MCG/DOSE AEPB INHALE 1 PUFF TWICE DAILY  . folic acid (FOLVITE) 622 MCG tablet Take 400 mcg by mouth daily.  . furosemide (LASIX) 20 MG tablet Take 1 tablet (20 mg total) by mouth daily.  . hydrALAZINE (APRESOLINE) 25 MG tablet Take 1 tablet in the morning as needed for  high blood pressure  . hydrALAZINE (APRESOLINE) 50 MG tablet Take 1 tablet (50 mg total) by mouth every 8 (eight) hours.  Marland Kitchen icosapent Ethyl (VASCEPA) 1 g capsule Take 2 capsules (2 g total) by mouth 2 (two) times daily.  Marland Kitchen lamoTRIgine (LAMICTAL) 25 MG tablet Take 1 tablet (25 mg total) by mouth at bedtime.  . Lancets Misc. (ACCU-CHEK FASTCLIX LANCET) KIT 1 Device by Does not apply route. Use as directed for testing blood sugar  . Lancets Misc. (ACCU-CHEK FASTCLIX LANCET) KIT Use  as directed to test blood sugar  . LANTUS SOLOSTAR 100 UNIT/ML Solostar Pen INJECT  17  TO  20 UNITS SUBCUTANEOUSLY EVERY MORNING  . Melatonin 5 MG CAPS Take 1 capsule by mouth at bedtime.   . metFORMIN (GLUCOPHAGE) 1000 MG tablet TAKE 1 TABLET TWICE DAILY  . nitroGLYCERIN (NITROSTAT) 0.4 MG SL tablet PLACE 1 TABLET (0.4 MG TOTAL) UNDER THE TONGUE EVERY 5 (FIVE) MINUTESAS NEEDED FOR CHEST PAIN (CHEST PAIN OR SHORTNESS OF BREATH). (Patient taking differently: Place 0.4 mg under the tongue every 5 (five) minutes as needed for chest pain (max 3 doses).)  . pantoprazole (PROTONIX) 40 MG tablet TAKE ONE TABLET BY MOUTH DAILY  . rosuvastatin (CRESTOR) 40 MG tablet TAKE 1 TABLET EVERY DAY (Patient taking differently: Take 40 mg by mouth daily.)  . senna-docusate (SENOKOT-S) 8.6-50 MG tablet Take 2 tablets by mouth at bedtime. For AFTER surgery, do not take if having diarrhea  . spironolactone (ALDACTONE) 25 MG tablet Take 1 tablet (25 mg total) by mouth daily.  Marland Kitchen tiotropium (SPIRIVA HANDIHALER) 18 MCG inhalation capsule Place 1 capsule (18 mcg total) into inhaler and inhale daily.  . traMADol (ULTRAM) 50 MG tablet Take 1 tablet (50 mg total) by mouth every 6 (six) hours as needed for severe pain. For AFTER surgery only, do not take and drive  . vitamin B-12 (CYANOCOBALAMIN) 1000 MCG tablet Take 1,000 mcg by mouth daily.  . vitamin E 400 UNIT capsule Take 400 Units by mouth at bedtime.  . sertraline (ZOLOFT) 50 MG tablet Take 2  tablets (100 mg total) by mouth at bedtime. (Patient taking differently: Take 100 mg by mouth daily.)  . [DISCONTINUED] acetaminophen (TYLENOL) 500 MG tablet Take 1,000 mg by mouth See admin instructions. Takes 1 tablet (500 mg totally) by mouth in the morning; takes 1 tablet (500 mg) in the afternoon and bedtime as needed for pain   No facility-administered encounter medications on file as of 11/24/2020.    ALLERGIES:  Allergies  Allergen Reactions  . Codeine Sulfate Nausea Only  . Morphine Sulfate Nausea Only     FAMILY HISTORY:  Family History  Problem Relation Age of Onset  . Heart attack Mother 20  . Cancer Father 78  . Cancer Maternal Grandfather   . Cancer Paternal Grandmother   . Cancer Paternal Grandfather   . Diabetes Son   . Liver cancer Brother   . Bone cancer Brother   . Heart attack Son   . Hypertension Son   . Diabetes Son   . Hyperlipidemia Son   . Colon cancer Neg Hx   . Ovarian cancer Neg Hx   . Uterine cancer Neg Hx      SOCIAL HISTORY:  Social Connections: Unknown  . Frequency of Communication with Friends and Family: Once a week  . Frequency of Social Gatherings with Friends and Family: Never  . Attends Religious Services: Never  . Active Member of Clubs or Organizations: No  . Attends Archivist Meetings: Never  . Marital Status: Not on file    REVIEW OF SYSTEMS:  Intermittent wheezing, knee pain, decreased concentration. Denies appetite changes, fevers, chills, fatigue, unexplained weight changes. Denies hearing loss, neck lumps or masses, mouth sores, ringing in ears or voice changes. Denies cough.  Denies shortness of breath. Denies chest pain or palpitations. Denies leg swelling. Denies abdominal distention, pain, blood in stools, constipation, diarrhea, nausea, vomiting, or early satiety. Denies pain with intercourse, dysuria, frequency, hematuria or incontinence. Denies hot  flashes, pelvic pain, vaginal bleeding or vaginal  discharge.   Denies back pain or muscle pain/cramps. Denies itching, rash, or wounds. Denies dizziness, headaches, numbness or seizures. Denies swollen lymph nodes or glands, denies easy bruising or bleeding. Denies anxiety, depression, confusion.  Physical Exam:  Vital Signs for this encounter:  Blood pressure (!) 110/50, pulse 66, temperature 98.6 F (37 C), temperature source Oral, resp. rate 20, height 5' 5" (1.651 m), weight 184 lb (83.5 kg), SpO2 99 %. Body mass index is 30.62 kg/m. General: Alert, oriented, no acute distress.  HEENT: Normocephalic, atraumatic. Sclera anicteric.  Chest: Clear to auscultation bilaterally. No wheezes, rhonchi, or rales. Cardiovascular: Regular rate and rhythm, no murmurs, rubs, or gallops.  Abdomen: Obese. Normoactive bowel sounds. Soft, nondistended, nontender to palpation. No masses or hepatosplenomegaly appreciated. No palpable fluid wave.  Large well-healed right upper quadrant incision. Extremities: Grossly normal range of motion. Warm, well perfused. 1+ edema bilaterally.  Skin: No rashes or lesions.  Lymphatics: No cervical, supraclavicular, or inguinal adenopathy.  GU:  Normal external female genitalia.  No lesions. No discharge or bleeding.             Bladder/urethra:  No lesions or masses, well supported bladder             Vagina: Mildly atrophic, no lesions or masses.             Cervix: Normal appearing, no lesions.             Uterus/adnexa: Small, mobile, no parametrial involvement or nodularity.  Large, mobile, smooth mass filling the posterior cul-de-sac.             Rectal: No nodularity.  EMB procedure Preoperative diagnosis: Postmenopausal bleeding, thickened endometrial lining Postoperative diagnosis: Same as above Procedure: Endometrial biopsy Physician: Berline Lopes MD Specimens: endometrial biopsy The procedure was discussed with the patient. The speculum was placed and the cervix was prepped with Betadine x3.  A single-tooth  tenaculum was placed on the anterior lip of the cervix.  The endometrial Pipelle was then inserted to a depth of just over 7 cm before resistance met.  1 pass was performed with scant tissue and some mucus.  This was placed in formalin to be sent to pathology.  No significant bleeding noted.  All instruments were removed from the vagina.  Overall the patient tolerated the procedure well.  LABORATORY AND RADIOLOGIC DATA:  Outside medical records were reviewed to synthesize the above history, along with the history and physical obtained during the visit.   Lab Results  Component Value Date   WBC 8.4 09/04/2020   HGB 12.4 09/04/2020   HCT 36.9 09/04/2020   PLT 249.0 09/04/2020   GLUCOSE 141 (H) 09/04/2020   CHOL 128 02/18/2020   TRIG 355 (H) 02/18/2020   HDL 49 (L) 02/18/2020   LDLDIRECT 40.0 07/23/2019   LDLCALC 42 02/18/2020   ALT 16 09/04/2020   AST 16 09/04/2020   NA 137 09/04/2020   K 4.1 09/04/2020   CL 104 09/04/2020   CREATININE 0.93 09/04/2020   BUN 29 (H) 09/04/2020   CO2 24 09/04/2020   TSH 0.820 08/12/2020   INR 1.04 03/05/2014   HGBA1C 6.5 (H) 08/26/2020   MICROALBUR 0.7 01/07/2014   Pelvic ultrasound exam 11/17/20: Both T/V and T/A images necessary to evaluate the pelvic anatomy. Small anteverted uterus measured at 6.64 x 3.95 x 3.93 cm with a 1.2 x 1 cm calcified fibroid. Thickened endometrial linewith cystic changes measured at  15.53 mm with a feeder vessel identified to the area of thickening. Right ovary is small with a 1.1 cm simple cyst. The left adnexa shows a large cystic mass with septations measured at 9.9 x 9.8 x 8.3 cm with 1 solid nodule along the cyst wall with vascularity. No free fluid seen in the pelvis or abdomen.

## 2020-11-24 NOTE — Addendum Note (Signed)
Addended by: Joylene John D on: 11/24/2020 01:59 PM   Modules accepted: Orders

## 2020-11-24 NOTE — Patient Instructions (Addendum)
Preparing for your Surgery  Plan for surgery on December 24, 2020 with Dr. Jeral Pinch at [facility] will be scheduled for a robotic assisted laparoscopic bilateral salpingo-oophorectomy (removal of both ovaries and fallopian tubes) with mini laparotomy (larger incision on your abdomen), possible total laparoscopic hysterectomy (removal of the uterus and cervix), possible dilation and curettage of the uterus, possible staging if a cancer is identified, possible laparotomy.   We will reach out to your primary care doctor and cardiologist prior to surgery. We will let you know when you need to stop taking your plavix.  We will also call you with the results of your endometrial biopsy from today.  Pre-operative Testing -You will receive a phone call from presurgical testing at [facility] to arrange for a pre-operative appointment, labs, and COVID test. The COVID test normally happens 3 days prior to the surgery and they ask that you self quarantine after the test up until surgery to decrease chance of exposure.  -Bring your insurance card, copy of an advanced directive if applicable, medication list  -At that visit, you will be asked to sign a consent for a possible blood transfusion in case a transfusion becomes necessary during surgery.  The need for a blood transfusion is rare but having consent is a necessary part of your care.     -You are fine to keep taking your aspirin 81 mg up until the day before surgery.  -Do not take supplements such as fish oil (omega 3), red yeast rice, turmeric before your surgery. You want to avoid medications with aspirin in them including headache powders such as BC or Goody's), Excedrin migraine.  Day Before Surgery at [facility] will be asked to take in a light diet the day before surgery. You will be advised you can have clear liquids up until 3 hours before your surgery.    Eat a light diet the day before surgery.  Examples including  soups, broths, toast, yogurt, mashed potatoes.  AVOID GAS PRODUCING FOODS. Things to avoid include carbonated beverages (fizzy beverages, sodas), raw fruits and raw vegetables (uncooked), or beans.   If your bowels are filled with gas, your surgeon will have difficulty visualizing your pelvic organs which increases your surgical risks.  Your role in recovery Your role is to become active as soon as directed by your doctor, while still giving yourself time to heal.  Rest when you feel tired. You will be asked to do the following in order to speed your recovery:  - Cough and breathe deeply. This helps to clear and expand your lungs and can prevent pneumonia after surgery.  - [place]. Do mild physical activity. Walking or moving your legs help your circulation and body functions return to normal. Do not try to get up or walk alone the first time after surgery.   -If you develop swelling on one leg or the other, pain in the back of your leg, redness/warmth in one of your legs, please call the office or go to the Emergency Room to have a doppler to rule out a blood clot. For shortness of breath, chest pain-seek care in the Emergency Room as soon as possible. - Actively manage your pain. Managing your pain lets you move in comfort. We will ask you to rate your pain on a scale of zero to 10. It is your responsibility to tell your doctor or nurse where and how much you hurt so your pain  can be treated.  Special Considerations -If you are diabetic, you may be placed on insulin after surgery to have closer control over your blood sugars to promote healing and recovery.  This does not mean that you will be discharged on insulin.  If applicable, your oral antidiabetics will be resumed when you are tolerating a solid diet.  -Your final pathology results from surgery should be available around one week after surgery and the results will be relayed to you when available.  Pain Management  After Surgery -You have been prescribed your pain medication and bowel regimen medications before surgery so that you can have these available when you are discharged from the hospital. The pain medication is for use ONLY AFTER surgery and a new prescription will not be given.   -Make sure that you have Tylenol at home to use on a regular basis after surgery for pain control.   -Review the attached handout on narcotic use and their risks and side effects.   Bowel Regimen -You have been prescribed Sennakot-S to take nightly to prevent constipation especially if you are taking the narcotic pain medication intermittently.  It is important to prevent constipation and drink adequate amounts of liquids. You can stop taking this medication when you are not taking pain medication and you are back on your normal bowel routine.  Risks of Surgery Risks of surgery are low but include bleeding, infection, damage to surrounding structures, re-operation, blood clots, and very rarely death.   Blood Transfusion Information (For the consent to be signed before surgery)  We will be checking your blood type before surgery so in case of emergencies, we will know what type of blood you would need.                                            WHAT IS A BLOOD TRANSFUSION?  A transfusion is the replacement of blood or some of its parts. Blood is made up of multiple cells which provide different functions.  Red blood cells carry oxygen and are used for blood loss replacement.  White blood cells fight against infection.  Platelets control bleeding.  Plasma helps clot blood.  Other blood products are available for specialized needs, such as hemophilia or other clotting disorders. BEFORE THE TRANSFUSION  Who gives blood for transfusions?   You may be able to donate blood to be used at a later date on yourself (autologous donation).  Relatives can be asked to donate blood. This is generally not any safer than if  you have received blood from a stranger. The same precautions are taken to ensure safety when a relative's blood is donated.  Healthy volunteers who are fully evaluated to make sure their blood is safe. This is blood bank blood. Transfusion therapy is the safest it has ever been in the practice of medicine. Before blood is taken from a donor, a complete history is taken to make sure that person has no history of diseases nor engages in risky social behavior (examples are intravenous drug use or sexual activity with multiple partners). The donor's travel history is screened to minimize risk of transmitting infections, such as malaria. The donated blood is tested for signs of infectious diseases, such as HIV and hepatitis. The blood is then tested to be sure it is compatible with you in order to minimize the chance of a transfusion  reaction. If you or a relative donates blood, this is often done in anticipation of surgery and is not appropriate for emergency situations. It takes many days to process the donated blood. RISKS AND COMPLICATIONS Although transfusion therapy is very safe and saves many lives, the main dangers of transfusion include:   Getting an infectious disease.  Developing a transfusion reaction. This is an allergic reaction to something in the blood you were given. Every precaution is taken to prevent this. The decision to have a blood transfusion has been considered carefully by your caregiver before blood is given. Blood is not given unless the benefits outweigh the risks.  AFTER SURGERY INSTRUCTIONS  Return to work: 4 weeks if applicable  You will have a white honeycomb dressing over your larger incision. This dressing can be removed 5 days after surgery and you do not need to reapply a new dressing. Once you remove the dressing, you will notice that you have the surgical glue (dermabond) on the incision and this will peel off on its own. You can get this dressing wet in the shower  the days after surgery prior to removal on the 5th day.  Activity: 1. Be up and out of the bed during the day.  Take a nap if needed.  You may walk up steps but be careful and use the hand rail.  Stair climbing will tire you more than you think, you may need to stop part way and rest.   2. No lifting or straining for 6 weeks over 10 pounds. No pushing, pulling, straining for 6 weeks.  3. No driving for 1 week(s) if you were cleared to drive before surgery.  Do not drive if you are taking narcotic pain medicine and make sure that your reaction time has returned.   4. You can shower as soon as the next day after surgery. Shower daily.  Use your regular soap and water (not directly on the incision) and pat your incision(s) dry afterwards; don't rub.  No tub baths or submerging your body in water until cleared by your surgeon. If you have the soap that was given to you by pre-surgical testing that was used before surgery, you do not need to use it afterwards because this can irritate your incisions.   5. No sexual activity and nothing in the vagina for 4 weeks, 8 weeks if you have a hysterectomy.  6. You may experience a small amount of clear drainage from your incisions, which is normal.  If the drainage persists, increases, or changes color please call the office.  7. Do not use creams, lotions, or ointments such as neosporin on your incisions after surgery until advised by your surgeon because they can cause removal of the dermabond glue on your incisions.    8. You may experience vaginal spotting after surgery or around the 6-8 week mark from surgery when the stitches at the top of the vagina begin to dissolve IF YOU HAVE A HYSTERECTOMY.  The spotting is normal but if you experience heavy bleeding, call our office.  9. Take Tylenol first for pain and only use narcotic pain medication for severe pain not relieved by the Tylenol.  Monitor your Tylenol intake to a max of 4,000 mg in a 24 hour period.    Diet: 1. Low sodium Heart Healthy Diet is recommended but you are cleared to resume your normal (before surgery) diet after your procedure.  2. It is safe to use a laxative, such as Miralax  or Colace, if you have difficulty moving your bowels. You have been prescribed Sennakot at bedtime every evening to keep bowel movements regular and to prevent constipation.    Wound Care: 1. Keep clean and dry.  Shower daily.  Reasons to call the Doctor:  Fever - Oral temperature greater than 100.4 degrees Fahrenheit  Foul-smelling vaginal discharge  Difficulty urinating  Nausea and vomiting  Increased pain at the site of the incision that is unrelieved with pain medicine.  Difficulty breathing with or without chest pain  New calf pain especially if only on one side  Sudden, continuing increased vaginal bleeding with or without clots.   Contacts: For questions or concerns you should contact:  Dr. Jeral Pinch at [phone]  Joylene John, NP at [phone]  After Hours: call [phone] and have the GYN Oncologist paged/contacted (after 5 pm or on the weekends).  Messages sent via mychart are for non-urgent matters and are not responded to after hours so for urgent needs, please call the after hours number.  ENDOMETRIAL BIOPSY POST-PROCEDURE INSTRUCTIONS  1. You may take Tylenol for pain if needed.  Cramping should resolve within in 24 hours.  2. You may have a small amount of spotting.  You should wear a mini pad for the next few days.  3. You may have intercourse after 24 hours.  4. You need to call if you have any pelvic pain, fever, heavy bleeding or foul smelling vaginal discharge.  5. Shower or bathe as normal  6. We will call you within one week with results or we will discuss the results at your follow-up appointment if needed.

## 2020-11-25 ENCOUNTER — Other Ambulatory Visit: Payer: Self-pay | Admitting: Neurology

## 2020-11-25 LAB — CA 125: Cancer Antigen (CA) 125: 11 U/mL (ref 0.0–38.1)

## 2020-11-26 ENCOUNTER — Telehealth: Payer: Self-pay

## 2020-11-26 ENCOUNTER — Telehealth: Payer: Self-pay | Admitting: Cardiovascular Disease

## 2020-11-26 LAB — SURGICAL PATHOLOGY

## 2020-11-26 NOTE — Telephone Encounter (Signed)
*  STAT* If patient is at the pharmacy, call can be transferred to refill team.   1. Which medications need to be refilled? (please list name of each medication and dose if known)  furosemide (LASIX) 20 MG tablet  2. Which pharmacy/location (including street and city if local pharmacy) is medication to be sent to? [facility] [address] [place]  3. Do they need a 30 day or 90 day supply? 90 day supply  [name] states the patient only has 40 MG tablets remaining and they have trouble splitting them in half without destroying the tablet. He stating the pharmacy has been requesting the refill all week and they haven't received an Rx yet, but the patient now has 4-5 tablets remaining.

## 2020-11-26 NOTE — Telephone Encounter (Signed)
Told son Jocee Kissick that the pathology from the endometrial biopsy on 11-24-20 showed no cancer. It showed a polyp. Melissa said that the tramadol does not need to be picked up.  It was sent on 11-24-20 prior to him stating that she could not take it. Inquired to whether or not his mother has had oxycodone or hydrocodone. Records indicate that she had oxycodone in 20014;2015. Son not aware of oxycodone given to his mother. She can use tylenol for pain. No ibuprofen due to renal status. Lenna Sciara will speak with Dr. Berline Lopes to discuss narcotic medication recommendations with his mother's sensitives and intolerance of other narcotics. Son verbalized understanding.

## 2020-11-27 ENCOUNTER — Other Ambulatory Visit: Payer: Self-pay | Admitting: Gynecologic Oncology

## 2020-11-27 ENCOUNTER — Telehealth: Payer: Self-pay | Admitting: *Deleted

## 2020-11-27 DIAGNOSIS — R9389 Abnormal findings on diagnostic imaging of other specified body structures: Secondary | ICD-10-CM

## 2020-11-27 DIAGNOSIS — N9489 Other specified conditions associated with female genital organs and menstrual cycle: Secondary | ICD-10-CM

## 2020-11-27 MED ORDER — OXYCODONE HCL 5 MG PO TABS: 2.5000 mg | ORAL_TABLET | Freq: Four times a day (QID) | ORAL | 0 refills | Status: DC | PRN

## 2020-11-27 NOTE — Telephone Encounter (Signed)
I contacted Michelle [name] to discuss possible medications to manage postop pain. She is agreeable to try oxycodone. She would be able take a 1/2 pill if she would like to try a small dose prn for severe pain along with some food to help prevent nausea.

## 2020-11-27 NOTE — Telephone Encounter (Signed)
Late Entry-----Called yesterday.Marland KitchenMarland KitchenMarland KitchenCalled the sleep study department and ask that the patient's procedure be moved up before her surgery; explained that the procedure is schedule the same day as her surgery. The sleep study department will call the patient to reschedule. Called and spoke with the patient's son and explained the above.

## 2020-11-27 NOTE — Progress Notes (Signed)
See RN note. Patient does not recall taking oxycodone in the past. Unable to take tramadol.

## 2020-11-27 NOTE — Telephone Encounter (Signed)
Pt not available. Left message with her son for patient call the office at [phone].

## 2020-11-29 ENCOUNTER — Other Ambulatory Visit: Payer: Self-pay

## 2020-11-29 ENCOUNTER — Ambulatory Visit (HOSPITAL_BASED_OUTPATIENT_CLINIC_OR_DEPARTMENT_OTHER): Payer: Medicare HMO | Attending: Cardiovascular Disease | Admitting: Cardiovascular Disease

## 2020-11-29 DIAGNOSIS — G4733 Obstructive sleep apnea (adult) (pediatric): Secondary | ICD-10-CM | POA: Diagnosis not present

## 2020-11-30 ENCOUNTER — Other Ambulatory Visit: Payer: Self-pay

## 2020-11-30 DIAGNOSIS — Z515 Encounter for palliative care: Secondary | ICD-10-CM | POA: Diagnosis not present

## 2020-11-30 DIAGNOSIS — N9489 Other specified conditions associated with female genital organs and menstrual cycle: Secondary | ICD-10-CM | POA: Diagnosis not present

## 2020-11-30 DIAGNOSIS — I5032 Chronic diastolic (congestive) heart failure: Secondary | ICD-10-CM | POA: Diagnosis not present

## 2020-11-30 DIAGNOSIS — R9389 Abnormal findings on diagnostic imaging of other specified body structures: Secondary | ICD-10-CM | POA: Diagnosis not present

## 2020-11-30 NOTE — Patient Outreach (Signed)
[facility]) Care Management  11/30/2020  Michelle [name] April 24, 1933 [id number]   Telephone Screen  Referral Date: 11/26/2020 Referral Source: High Risk Report Insurance: [facility]   Outreach attempt # 1 to patient. No answer after multiple rings and unable to leave message.    Plan: RN CM will make outreach attempt to patient within 3-4 business days.   [name], RN,BSN,CCM [place] Management Telephonic Care Management Coordinator Direct Phone: [phone] Toll Free: [phone] Fax: [phone]

## 2020-12-01 ENCOUNTER — Telehealth: Payer: Self-pay | Admitting: *Deleted

## 2020-12-01 ENCOUNTER — Other Ambulatory Visit: Payer: Self-pay

## 2020-12-01 NOTE — Patient Outreach (Signed)
[facility]) Care Management  12/01/2020  Michelle [name] [birth year] [id number]    Telephone Screen  Referral Date: 11/26/2020 Referral Source: High Risk Report Insurance: [facility]    Unsuccessful outreach attempt to patient. No answer and unable to leave message.   Plan: RN CM will make outreach attempt to patient within 3-4 business days.  [name], RN,BSN,CCM [place] Management Telephonic Care Management Coordinator Direct Phone: [phone] Toll Free: [phone] Fax: [phone]

## 2020-12-01 NOTE — Telephone Encounter (Signed)
   [facility] Pre-operative Risk Assessment    Patient Name: Michelle [name]  DOB: 23-Sep-1932  MRN: [id number]     Request for surgical clearance:  1. What type of surgery is being performed?  Robotic assist laparoscopic bil salpingo- oophorectomy,mini lap,poss hyst.  2. When is this surgery scheduled?12/24/20   3. What type of clearance is required (medical clearance vs. Pharmacy clearance to hold med vs. Both)?medical     Are there any medications that need to be held prior to surgery and how long ? Plavix 75 mg , aspirin 81 mg    4. Practice name and name of physician performing surgery?  [facility]-gy[facility]cology oncology ; Dr Jeral Pinch   5. What is the office phone number?[facility]   What is the office fax number?[facility]   Anesthesia type (None, local, MAC, general) ? General    Raiford Simmonds 12/01/2020, 12:42 PM  _________________________________________________________________   (provider comments below)

## 2020-12-02 ENCOUNTER — Telehealth: Payer: Self-pay | Admitting: *Deleted

## 2020-12-02 ENCOUNTER — Other Ambulatory Visit: Payer: Self-pay | Admitting: Nurse Practitioner

## 2020-12-02 MED ORDER — FUROSEMIDE 20 MG PO TABS: 20.0000 mg | ORAL_TABLET | Freq: Every day | ORAL | 0 refills | Status: DC

## 2020-12-02 MED ORDER — FUROSEMIDE 20 MG PO TABS: 20.0000 mg | ORAL_TABLET | Freq: Every day | ORAL | 2 refills | Status: DC

## 2020-12-02 NOTE — Telephone Encounter (Signed)
Received surgical stratification/optimization form from her PCP, fax to presurgical dept

## 2020-12-02 NOTE — Progress Notes (Signed)
Refills of furosemide 20 mg tablets sent per request of patient's son. He has been splitting 40 mg tablets and having difficulty with getting 2 equal halves.

## 2020-12-02 NOTE — Telephone Encounter (Signed)
[name] calling back. He states he has not received any call backs. He states to call: [phone], if this does not work call: [phone]

## 2020-12-02 NOTE — Telephone Encounter (Signed)
Sharyn Lull from [facility] is calling in regards to this surgical clearance. She can be reached at  [phone]

## 2020-12-03 ENCOUNTER — Telehealth: Payer: Self-pay

## 2020-12-03 ENCOUNTER — Other Ambulatory Visit: Payer: Self-pay

## 2020-12-03 NOTE — Telephone Encounter (Signed)
Received a call from [facility] at the patient's cardiology office, patient wants to cancel her surgery. Jeralene Huff stated that "the patient and her son spoke with [name], NP at the cardiology. The patient has had no further bleeding or discomfort. She doesn't feel like she will make it our of the surgery and she is scared."

## 2020-12-03 NOTE — Telephone Encounter (Signed)
Spoke with son Michelle [name]. He stated that his mother was scared of surgery from the initial visit. She is concerned at her age that she will not make it off the table. [name] at the cardiologist's office  Told them that the surgery was 3 hours and that is a long time to be under anesthesia.  He and his brother are also concerned with her age and having surgery. Michelle [name] said that since the endometrial biopsy was negative and she was not having any pain or bleeding that she wait until she has sypmtoms from the mass to proceed with surgery.[name], NP had mention this approach to them as well. Michelle [name] is aware that the endometrial biopsy being negative does not address the adnexal mass. They cannot force their mother to have the surgery if she does not want it.

## 2020-12-03 NOTE — Patient Outreach (Signed)
[facility]) Care Management  12/03/2020  Michelle [name] [birth year] [id number]     Telephone Screen  Referral Date:11/26/2020 Referral Source:High Risk Report Insurance:[employer]   Unsuccessful outreach attempt to patient.    Plan: RN CM will make outreach attempt to patient within 3-4 wks if no response from letter mailed to patient.  [name], RN,BSN,CCM [facility] Management Telephonic Care Management Coordinator Direct Phone: [phone] Toll Free: [phone] Fax: [phone]

## 2020-12-03 NOTE — Telephone Encounter (Signed)
Call and spoke with Sharyn Lull at [facility]- gynecology oncology and let her know pt Michelle Zemanek do not wish to proceed with surgery, she voice understanding and thanks for the call.

## 2020-12-03 NOTE — Telephone Encounter (Signed)
Michelle [name] a pain 87 year old female contacted today as part of preoperative protocol.  I spoke with both her and her son.  Shared decision making to review her preoperative surgical request.  She reports that she is having no further vaginal bleeding.  She does not wish to proceed with surgery at this time.  Her son was supportive of her decision.  I will remove request when preoperative pool.

## 2020-12-11 ENCOUNTER — Encounter (HOSPITAL_COMMUNITY): Admission: RE | Admit: 2020-12-11 | Payer: Medicare HMO | Source: Ambulatory Visit

## 2020-12-11 ENCOUNTER — Other Ambulatory Visit: Payer: Self-pay | Admitting: Gynecologic Oncology

## 2020-12-11 ENCOUNTER — Telehealth: Payer: Self-pay

## 2020-12-11 DIAGNOSIS — N9489 Other specified conditions associated with female genital organs and menstrual cycle: Secondary | ICD-10-CM

## 2020-12-11 NOTE — Telephone Encounter (Signed)
Gave appointment information to son [name] for [place] on 02-24-21 at 1330 at [facility] radiology with arrival at 1300. Pt to drink 32 oz of water 1 hour prior to [place]. Dr. Berline Lopes will do a phone visit on Friday 02-26-21  At 3:45 pm to review the [place] results. [name] wrote information down and verbalized understanding.

## 2020-12-11 NOTE — Telephone Encounter (Signed)
Spoke with son Michelle [name] and mother Michelle [name] to see if MRI would be acceptable to her to do for imaging of the adnexal mass as this would be optimal.  An US of the pelvis could be done if [name] not able to tolerate  MRI. [name] and son Michelle [name] prefer to do the [place] in 3 months ~ 02-17-21 with phone visit with Dr. Berline Lopes a couple of days later to discuss the results.

## 2020-12-11 NOTE — Progress Notes (Signed)
Plan for re-evaluation of left adnexal mass with repeat US in three months. A phone visit will be scheduled after to discuss results with Dr. Berline Lopes.

## 2020-12-15 ENCOUNTER — Other Ambulatory Visit: Payer: Self-pay | Admitting: Family Medicine

## 2020-12-16 ENCOUNTER — Other Ambulatory Visit: Payer: Self-pay | Admitting: Family Medicine

## 2020-12-18 ENCOUNTER — Other Ambulatory Visit: Payer: Self-pay | Admitting: Cardiovascular Disease

## 2020-12-18 ENCOUNTER — Ambulatory Visit: Payer: Medicare HMO | Admitting: Neurology

## 2020-12-18 DIAGNOSIS — I10 Essential (primary) hypertension: Secondary | ICD-10-CM

## 2020-12-20 ENCOUNTER — Encounter (HOSPITAL_BASED_OUTPATIENT_CLINIC_OR_DEPARTMENT_OTHER): Payer: Self-pay | Admitting: Cardiovascular Disease

## 2020-12-20 NOTE — Procedures (Signed)
Patient Name: Michelle [name], Michelle [name] Date: 11/29/2020 Gender: Female D.O.B: [birth year] Age (years): 40 Referring Provider: Skeet Latch Height (inches): 65 Interpreting Physician: Shelva Majestic MD, ABSM Weight (lbs): 180 RPSGT: Gwenyth Allegra BMI: 30 MRN: [id number] Neck Size: 15.00  CLINICAL INFORMATION The patient is referred for a CPAP titration to treat sleep apnea.  Date of HST: 10/09/2020:  AHI 19.8/h; O2 nadir 78%.    SLEEP STUDY TECHNIQUE As per the AASM Manual for the Scoring of Sleep and Associated Events v2.3 (April 2016) with a hypopnea requiring 4% desaturations.  The channels recorded and monitored were frontal, central and occipital EEG, electrooculogram (EOG), submentalis EMG (chin), nasal and oral airflow, thoracic and abdominal wall motion, anterior tibialis EMG, snore microphone, electrocardiogram, and pulse oximetry. Continuous positive airway pressure (CPAP) was initiated at the beginning of the study and titrated to treat sleep-disordered breathing.  MEDICATIONS ACCU-CHEK SMARTVIEW test strip  acetaminophen (TYLENOL) 650 MG CR tablet  albuterol (PROVENTIL) (2.5 MG/3ML) 0.083% nebulizer solution  albuterol (VENTOLIN HFA) 108 (90 Base) MCG/ACT inhaler  Alcohol Swabs (B-D SINGLE USE SWABS REGULAR) PADS  amLODipine (NORVASC) 10 MG tablet  aspirin 81 MG tablet  Blood Glucose Calibration (ACCU-CHEK SMARTVIEW CONTROL) LIQD  blood glucose meter kit and supplies KIT  calcium carbonate (OSCAL) 1500 (600 Ca) MG TABS tablet  carvedilol (COREG) 12.5 MG tablet (Expired)  cholecalciferol (VITAMIN D3) 25 MCG (1000 UNIT) tablet  clopidogrel (PLAVIX) 75 MG tablet  diclofenac Sodium (VOLTAREN) 1 % GEL  DROPLET PEN NEEDLES 31G X 8 MM MISC  enalapril (VASOTEC) 20 MG tablet  fenofibrate micronized (LOFIBRA) 134 MG capsule  ferrous sulfate 325 (65 FE) MG tablet  Fluticasone-Salmeterol (ADVAIR) 250-50 MCG/DOSE AEPB  folic acid (FOLVITE) 372 MCG tablet   furosemide (LASIX) 20 MG tablet  furosemide (LASIX) 20 MG tablet  hydrALAZINE (APRESOLINE) 25 MG tablet  hydrALAZINE (APRESOLINE) 50 MG tablet  icosapent Ethyl (VASCEPA) 1 g capsule  lamoTRIgine (LAMICTAL) 25 MG tablet  Lancets Misc. (ACCU-CHEK FASTCLIX LANCET) KIT  Lancets Misc. (ACCU-CHEK FASTCLIX LANCET) KIT  LANTUS SOLOSTAR 100 UNIT/ML Solostar Pen  loratadine (CLARITIN) 10 MG tablet  Melatonin 5 MG CAPS  metFORMIN (GLUCOPHAGE) 1000 MG tablet  nitroGLYCERIN (NITROSTAT) 0.4 MG SL tablet  oxyCODONE (OXY IR/ROXICODONE) 5 MG immediate release tablet  pantoprazole (PROTONIX) 40 MG tablet  rosuvastatin (CRESTOR) 40 MG tablet  senna-docusate (SENOKOT-S) 8.6-50 MG tablet  sertraline (ZOLOFT) 50 MG tablet (Expired)  spironolactone (ALDACTONE) 25 MG tablet  tiotropium (SPIRIVA HANDIHALER) 18 MCG inhalation capsule  vitamin B-12 (CYANOCOBALAMIN) 1000 MCG tablet  vitamin E 400 UNIT capsule  Medications self-administered by patient taken the night of the study : N/A  TECHNICIAN COMMENTS Comments added by technician: Patient was restless all through the night. Patient had difficulty initiating sleep. Comments added by scorer: N/A RESPIRATORY PARAMETERS Optimal PAP Pressure (cm):  AHI at Optimal Pressure (/hr): N/A Overall Minimal O2 (%): 93.0 Supine % at Optimal Pressure (%): N/A Minimal O2 at Optimal Pressure (%): 93.0   SLEEP ARCHITECTURE The study was initiated at 10:42:20 PM and ended at 4:48:00 AM.  Sleep onset time was 233.4 minutes and the sleep efficiency was 0.8%%. The total sleep time was 3 minutes.  The patient spent 100.0%% of the night in stage N1 sleep, 0.0%% in stage N2 sleep, 0.0%% in stage N3 and 0% in REM.Stage REM latency was N/A minutes  Wake after sleep onset was 129.3. Alpha intrusion was absent. Supine sleep was 100.00%.  CARDIAC DATA The 2 lead  EKG demonstrated sinus rhythm. The mean heart rate was 69.3 beats per minute. Other EKG findings include: periods  of irregular rhythm concerning for possible atrial fibrillation.  LEG MOVEMENT DATA The total Periodic Limb Movements of Sleep (PLMS) were 0. The PLMS index was 0.0. A PLMS index of <15 is considered normal in adults.  IMPRESSIONS - CPAP was initiated at 6 cm and only titrated to 7 cm due to very poor and inadequate sleep duration (only 3 minutes; 0.8% sleep efficiency) from painful knees. AHI at 7 cm was 81.2/h. An optimal PAP pressure could not be selected for this patient based on the available study data. - Moderate Central Sleep Apnea was noted during this titration, but only one event (CAI 20/h). - Significant oxygen desaturations were not observed during this titration (min O2  93.0%). - No snoring was audible during this study. - Periods of cardiac irregularity concerning for possible atrial fibrillation. - Clinically significant periodic limb movements were not noted during this study. Arousals associated with PLMs were rare.  DIAGNOSIS - Obstructive Sleep Apnea (G47.33)  RECOMMENDATIONS - Recommend an initial trial of Auto-CPAP with EPR of 3 at 8 - 20 cm H2O. If unable to tolerate CPAP then transition to Auto-BiPAP. - Effort should be made to optimize nasal and oropharyngeal patency. - Avoid alcohol, sedatives and other CNS depressants that may worsen sleep apnea and disrupt normal sleep architecture. - Recommend follow-up ECG with potential cardiac monitor. - Sleep hygiene should be reviewed to assess factors that may improve sleep quality. - Weight management and regular exercise should be initiated or continued. - Recommend a download and sleep clinic evalution after 4 weeks of therapy   [Electronically signed] 12/20/2020 12:49 PM  Shelva Majestic MD, [facility], ABSM Diplomate, American Board of Sleep Medicine   NPI: [id number] [place] PH: [phone]   FX: [phone] [place]

## 2020-12-23 ENCOUNTER — Other Ambulatory Visit: Payer: Self-pay

## 2020-12-23 NOTE — Patient Outreach (Signed)
[facility]) Care Management  12/23/2020  Michelle [name] 1932/09/29 [id number]   Telephone Screen  Referral Date:11/26/2020 Referral Source:High Risk Report Insurance:[employer]    Multiple attempts to establish contact with patient without success. No response from letter mailed to patient. Case is being closed at this time.     Plan: RN CM will close case at this time.   [name], RN,BSN,CCM [place] Management Telephonic Care Management Coordinator Direct Phone: [phone] Toll Free: [phone] Fax: [phone]

## 2020-12-24 ENCOUNTER — Encounter (HOSPITAL_BASED_OUTPATIENT_CLINIC_OR_DEPARTMENT_OTHER): Payer: Medicare HMO | Admitting: Cardiovascular Disease

## 2020-12-24 ENCOUNTER — Ambulatory Visit: Admit: 2020-12-24 | Payer: Medicare HMO | Admitting: Gynecologic Oncology

## 2020-12-24 ENCOUNTER — Ambulatory Visit: Payer: Self-pay

## 2020-12-24 SURGERY — SALPINGO-OOPHORECTOMY, BILATERAL, ROBOT-ASSISTED
Anesthesia: General

## 2020-12-28 ENCOUNTER — Telehealth: Payer: Self-pay | Admitting: *Deleted

## 2020-12-28 DIAGNOSIS — I5032 Chronic diastolic (congestive) heart failure: Secondary | ICD-10-CM | POA: Diagnosis not present

## 2020-12-28 DIAGNOSIS — R197 Diarrhea, unspecified: Secondary | ICD-10-CM | POA: Diagnosis not present

## 2020-12-28 NOTE — Telephone Encounter (Signed)
-----   Message from Troy Sine, MD sent at 12/20/2020 12:57 PM EDT ----- Mariann Laster, please notify pt and set up with DME for initial Auto-PAP trial, but may nee BiPAP; arrange for sleep clinic after 4 weeks of Rx.

## 2020-12-28 NOTE — Telephone Encounter (Signed)
Informed message will be in MyChart for patient  in reference to her sleep study.

## 2020-12-29 ENCOUNTER — Telehealth: Payer: Self-pay | Admitting: *Deleted

## 2020-12-29 NOTE — Telephone Encounter (Signed)
MyChart message sent to patient.

## 2020-12-31 ENCOUNTER — Ambulatory Visit: Payer: Medicare HMO | Admitting: Gynecologic Oncology

## 2021-01-04 ENCOUNTER — Other Ambulatory Visit: Payer: Self-pay | Admitting: Cardiovascular Disease

## 2021-01-04 ENCOUNTER — Other Ambulatory Visit: Payer: Self-pay | Admitting: Pharmacist Clinician (PhC)/ Clinical Pharmacy Specialist

## 2021-01-04 DIAGNOSIS — I10 Essential (primary) hypertension: Secondary | ICD-10-CM

## 2021-01-06 ENCOUNTER — Telehealth: Payer: Self-pay

## 2021-01-06 NOTE — Telephone Encounter (Signed)
Called and spoke with pt and he would like for you to give him a call to get pt scheduled, he states he did not decline appointment and he really wants pt to be seen. I let him know that someone would call back to get pt scheduled

## 2021-01-06 NOTE — Telephone Encounter (Signed)
[name] called in stating that Michelle [name] has bronchitis and is on a round of antibiotics. The nurse came out today to follow up and noticed that Michelle [name]'s feet/ankles are very swollen and wanting to know what to do. Offered an appointment but declined.

## 2021-01-06 NOTE — Telephone Encounter (Signed)
Michelle [name], patient is scheduled for tomorrow with Dr.Andy.

## 2021-01-07 ENCOUNTER — Ambulatory Visit (INDEPENDENT_AMBULATORY_CARE_PROVIDER_SITE_OTHER): Payer: Medicare HMO | Admitting: Family Medicine

## 2021-01-07 ENCOUNTER — Other Ambulatory Visit: Payer: Self-pay

## 2021-01-07 ENCOUNTER — Encounter: Payer: Self-pay | Admitting: Family Medicine

## 2021-01-07 VITALS — BP 133/73 | HR 67 | Temp 98.3°F | Ht 65.0 in | Wt 188.4 lb

## 2021-01-07 DIAGNOSIS — M25474 Effusion, right foot: Secondary | ICD-10-CM

## 2021-01-07 DIAGNOSIS — M25475 Effusion, left foot: Secondary | ICD-10-CM | POA: Diagnosis not present

## 2021-01-07 DIAGNOSIS — I5032 Chronic diastolic (congestive) heart failure: Secondary | ICD-10-CM

## 2021-01-07 DIAGNOSIS — J449 Chronic obstructive pulmonary disease, unspecified: Secondary | ICD-10-CM

## 2021-01-07 DIAGNOSIS — R053 Chronic cough: Secondary | ICD-10-CM

## 2021-01-07 DIAGNOSIS — M25471 Effusion, right ankle: Secondary | ICD-10-CM

## 2021-01-07 DIAGNOSIS — M25472 Effusion, left ankle: Secondary | ICD-10-CM

## 2021-01-07 NOTE — Patient Instructions (Signed)
Please schedule an office with Dr. Yong Channel to further address your chronic cough.   Please start OTC zyrtec nightly and plain mucinex (liquid or tablets) twice daily. These medications can dry up mucous and thin it out so it may help your nighttime coughing.   You have some ankle swelling today; if the mucinex seems to worsen it, stop it; however, it shouldn't.   If you have any questions or concerns, please don't hesitate to send me a message via MyChart or call the office at [phone]. Thank you for visiting with [place] today! It's our pleasure caring for you.

## 2021-01-07 NOTE — Progress Notes (Signed)
Subjective  CC:  Chief Complaint  Patient presents with   Foot Swelling    Thinks it was from a medication, intermittent swelling. Has improved.    Same day acute visit; PCP not available. New pt to me. Chart reviewed.  HPI: Michelle [name] is a 87 y.o. female who presents to the office today to address the problems listed above in the chief complaint. 87 year old presents with her son who is concerned about recent ankle swelling and persistent cough.  I reviewed multiple notes and records from her chart.  Patient son reports that she was hospitalized back in January, prolonged hospitalization.  Diagnosed with COPD and heart failure.  She has had follow-up with cardiology and her primary care doctor since.  However she reports a chronic nightly cough.  She describes a thick mucus drainage that causes her to cough.  During the day she does not have significant coughing symptoms.  She has no new recent symptoms, specifically no URI symptoms, sore throat, ear congestion, sneezing or fevers.  She denies shortness of breath.  She has a home health nurse who recommended Mucinex DM.  After starting this she had significant ankle swellings.  She stopped it 2 days ago and her ankles have returned to her baseline.  Her son would like medication to stop the cough.  She does use cough drops. Medication review reveals ACE inhibitor, amlodipine and diuretics. Review of systems is negative for chest pain, persistent leg edema, calf pain, reflux, sneezing.  Assessment  1. Chronic cough   2. Chronic obstructive pulmonary disease, unspecified COPD type ([place])   3. Chronic diastolic CHF (congestive heart failure) ([place])   4. Bilateral swelling of feet and ankles      Plan  Chronic cough: Likely multifactorial.  Hard to decipher today but there are no signs or symptoms of an acute infectious etiology.  Counseling and education given.  Given drainage in the back of the throat, recommend Zyrtec nightly and trial  of plain Mucinex twice daily.  She does take Advair for her COPD.  She denies wheezing.  She does use albuterol and says this is not effective cough.  I do not suspect a COPD exacerbation.  Her heart failure could be contributing.  She could have GERD as well.  The ankle edema could be related to the medication or an exacerbation of her heart failure on amlodipine.  I discussed these possibilities with the patient and her son.  For now, start Zyrtec 10 mg nightly and Mucinex twice daily and monitor.  I recommend a follow-up appointment with her PCP to further investigate.  Realistic expectations given.  Follow up: Recommend follow-up with PCP in 3 to 4 weeks. 06/07/2021  No orders of the defined types were placed in this encounter.  No orders of the defined types were placed in this encounter.     I reviewed the patients updated PMH, FH, and SocHx.    Patient Active Problem List   Diagnosis Date Noted   Adnexal mass 11/24/2020   Thickened endometrium 11/24/2020   Memory loss 10/22/2020   Delirium 06/25/2020   Actinic keratosis 11/17/2017   Anemia, iron deficiency 10/23/2015   CKD (chronic kidney disease), stage III ([place]) 07/25/2014   Osteoarthritis, knee 05/13/2014   Anxiety state 04/21/2014   Chronic diastolic CHF (congestive heart failure) ([place]) 03/13/2014   SOB (shortness of breath) 03/05/2014   CAD- RCA PCI '90s, STEMI-RCA DES 02/18/14 03/05/2014   Back pain, lumbosacral 10/10/2012  Type 2 diabetes mellitus without complication, with long-term current use of insulin ([place]) 03/26/2007   Hyperlipidemia associated with type 2 diabetes mellitus ([place]) 03/26/2007   Essential hypertension 04/21/2006   COPD (chronic obstructive pulmonary disease) ([place]) 04/21/2006   Current Meds  Medication Sig   Accu-Chek FastClix Lancets MISC Use to test blood sugars daily. Dx: E11.9   ACCU-CHEK SMARTVIEW test strip TEST BLOOD SUGAR THREE TIMES DAILY   acetaminophen (TYLENOL) 650 MG CR tablet Take  1,300 mg by mouth every 8 (eight) hours as needed for pain.   albuterol (PROVENTIL) (2.5 MG/3ML) 0.083% nebulizer solution USE THREE MILLILITERS VIA NEBULIZATION BY MOUTH EVERY 6 HOURS AS NEEDED FOR WHEEZING OR SHORTNESS OF BREATH (Patient taking differently: Take 2.5 mg by nebulization every 6 (six) hours as needed for wheezing or shortness of breath.)   albuterol (VENTOLIN HFA) 108 (90 Base) MCG/ACT inhaler Inhale 1 puff into the lungs every 4 (four) hours as needed for wheezing or shortness of breath (RESCUE inhaler if advair not working).   Alcohol Swabs (B-D SINGLE USE SWABS REGULAR) PADS USE  FOR  TESTING THREE TIMES DAILY   amLODipine (NORVASC) 10 MG tablet TAKE 1 TABLET EVERY DAY (Patient taking differently: Take 10 mg by mouth daily.)   aspirin 81 MG tablet Take 81 mg by mouth daily with breakfast.   Blood Glucose Calibration (ACCU-CHEK SMARTVIEW CONTROL) LIQD    blood glucose meter kit and supplies KIT Dispense based on patient and insurance preference. Use up to four times daily as directed. (FOR ICD-9 250.00, 250.01).   calcium carbonate (OSCAL) 1500 (600 Ca) MG TABS tablet Take 600 mg of elemental calcium by mouth daily with breakfast.   cholecalciferol (VITAMIN D3) 25 MCG (1000 UNIT) tablet Take 1,000 Units by mouth daily with breakfast.   clopidogrel (PLAVIX) 75 MG tablet TAKE ONE TABLET BY MOUTH DAILY **PLEASE KEEP UPCOMING APPOINTMENT IN NOVEMBER WITH DOCTOR [facility] FOR FUTURE REFILLS (Patient taking differently: Take 75 mg by mouth daily with breakfast.)   diclofenac Sodium (VOLTAREN) 1 % GEL Apply 2 g topically 4 (four) times daily. (Patient taking differently: Apply 2 g topically 4 (four) times daily as needed (knee pain).)   DROPLET PEN NEEDLES 31G X 8 MM MISC USE AS DIRECTED WITH LANTUS SOLOSTAR PEN   enalapril (VASOTEC) 20 MG tablet TAKE 1 TABLET TWICE DAILY (Patient taking differently: Take 20 mg by mouth 2 (two) times daily.)   fenofibrate micronized (LOFIBRA) 134 MG capsule  Take 1 capsule (134 mg total) by mouth daily.   ferrous sulfate 325 (65 FE) MG tablet Twice a week (Patient taking differently: Take 325 mg by mouth 2 (two) times a week.)   Fluticasone-Salmeterol (ADVAIR) 250-50 MCG/DOSE AEPB INHALE 1 PUFF TWICE DAILY (Patient taking differently: Inhale 1 puff into the lungs in the morning and at bedtime.)   folic acid (FOLVITE) 287 MCG tablet Take 400 mcg by mouth daily with breakfast.   furosemide (LASIX) 20 MG tablet Take 1 tablet (20 mg total) by mouth daily.   furosemide (LASIX) 20 MG tablet Take 1 tablet (20 mg total) by mouth daily.   hydrALAZINE (APRESOLINE) 25 MG tablet TAKE ONE TABLET BY MOUTH EVERY MORNING AS NEEDED FOR HIGH BLOOD PRESSURE   hydrALAZINE (APRESOLINE) 50 MG tablet Take 1 tablet (50 mg total) by mouth every 8 (eight) hours. (Patient taking differently: Take 50 mg by mouth in the morning and at bedtime.)   icosapent Ethyl (VASCEPA) 1 g capsule Take 2 capsules (2 g total) by  mouth 2 (two) times daily.   lamoTRIgine (LAMICTAL) 25 MG tablet TAKE ONE TABLET BY MOUTH AT BEDTIME (Patient taking differently: Take 25 mg by mouth at bedtime.)   Lancets Misc. (ACCU-CHEK FASTCLIX LANCET) KIT 1 Device by Does not apply route. Use as directed for testing blood sugar   Lancets Misc. (ACCU-CHEK FASTCLIX LANCET) KIT Use as directed to test blood sugar   LANTUS SOLOSTAR 100 UNIT/ML Solostar Pen INJECT  17  TO  20 UNITS SUBCUTANEOUSLY EVERY MORNING (Patient taking differently: Inject 18 Units into the skin 2 (two) times daily.)   loratadine (CLARITIN) 10 MG tablet Take 10 mg by mouth every other day.   Melatonin 5 MG CAPS Take 5 mg by mouth at bedtime.   metFORMIN (GLUCOPHAGE) 1000 MG tablet TAKE 1 TABLET TWICE DAILY (Patient taking differently: Take 1,000 mg by mouth in the morning and at bedtime.)   nitroGLYCERIN (NITROSTAT) 0.4 MG SL tablet PLACE 1 TABLET (0.4 MG TOTAL) UNDER THE TONGUE EVERY 5 (FIVE) MINUTESAS NEEDED FOR CHEST PAIN (CHEST PAIN OR SHORTNESS  OF BREATH). (Patient taking differently: Place 0.4 mg under the tongue every 5 (five) minutes as needed for chest pain (max 3 doses).)   pantoprazole (PROTONIX) 40 MG tablet TAKE ONE TABLET BY MOUTH DAILY (Patient taking differently: Take 40 mg by mouth daily with breakfast.)   rosuvastatin (CRESTOR) 40 MG tablet TAKE 1 TABLET EVERY DAY   spironolactone (ALDACTONE) 25 MG tablet Take 1 tablet (25 mg total) by mouth daily. (Patient taking differently: Take 25 mg by mouth daily with breakfast.)   tiotropium (SPIRIVA HANDIHALER) 18 MCG inhalation capsule Place 1 capsule (18 mcg total) into inhaler and inhale daily. (Patient taking differently: Place 18 mcg into inhaler and inhale daily before breakfast.)   vitamin B-12 (CYANOCOBALAMIN) 1000 MCG tablet Take 1,000 mcg by mouth daily with breakfast.   vitamin E 400 UNIT capsule Take 400 Units by mouth at bedtime.    Allergies: Patient is allergic to codeine sulfate and morphine sulfate. Family History: Patient family history includes Bone cancer in her brother; Cancer in her maternal grandfather, paternal grandfather, and paternal grandmother; Cancer (age of onset: 18) in her father; Diabetes in her son and son; Heart attack in her son; Heart attack (age of onset: 58) in her mother; Hyperlipidemia in her son; Hypertension in her son; Liver cancer in her brother. Social History:  Patient  reports that she quit smoking about 14 years ago. Her smoking use included cigarettes. She has a 55.00 pack-year smoking history. She has never used smokeless tobacco. She reports that she does not drink alcohol and does not use drugs.  Review of Systems: Constitutional: Negative for fever malaise or anorexia Cardiovascular: negative for chest pain Respiratory: negative for SOB or persistent cough Gastrointestinal: negative for abdominal pain  Objective  Vitals: BP 133/73   Pulse 67   Temp 98.3 F (36.8 C) (Temporal)   Ht '5\' 5"'  (1.651 m)   Wt 188 lb 6.4 oz (85.5  kg)   SpO2 96%   BMI 31.35 kg/m  General: no acute distress , A&Ox3, sitting in wheelchair, comfortable, no respiratory distress HEENT: PEERL, conjunctiva normal, neck is supple, no lymphadenopathy Cardiovascular:  RRR without murmur or gallop.  Respiratory:  Good breath sounds bilaterally, CTAB with normal respiratory effort, no wheezing, rhonchi or rales present Skin:  Warm, no rashes Extremities: +1 pitting edema to bilateral ankles present.  Nontender calves    Commons side effects, risks, benefits, and alternatives for medications and  treatment plan prescribed today were discussed, and the patient expressed understanding of the given instructions. Patient is instructed to call or message via MyChart if he/she has any questions or concerns regarding our treatment plan. No barriers to understanding were identified. We discussed Red Flag symptoms and signs in detail. Patient expressed understanding regarding what to do in case of urgent or emergency type symptoms.  Medication list was reconciled, printed and provided to the patient in AVS. Patient instructions and summary information was reviewed with the patient as documented in the AVS. This note was prepared with assistance of Dragon voice recognition software. Occasional wrong-word or sound-a-like substitutions may have occurred due to the inherent limitations of voice recognition software  This visit occurred during the SARS-CoV-2 public health emergency.  Safety protocols were in place, including screening questions prior to the visit, additional usage of staff PPE, and extensive cleaning of exam room while observing appropriate contact time as indicated for disinfecting solutions.

## 2021-01-08 ENCOUNTER — Other Ambulatory Visit: Payer: Self-pay | Admitting: Family Medicine

## 2021-01-08 ENCOUNTER — Telehealth: Payer: Self-pay

## 2021-01-08 DIAGNOSIS — I2510 Atherosclerotic heart disease of native coronary artery without angina pectoris: Secondary | ICD-10-CM

## 2021-01-08 NOTE — Telephone Encounter (Signed)
Please advise 

## 2021-01-08 NOTE — Telephone Encounter (Signed)
Pt's son called regarding the medicine that Dr Jonni Sanger gave on 01/07/21. He stated that he brought Michelle [name] in with ankle swelling/pain on 01/07/21. He called this morning stating that the swelling did get better after taking the medicine but this morning it has gotten worse. He stated that Dr Jonni Sanger told him to give a call was not better. Please advise.

## 2021-01-08 NOTE — Telephone Encounter (Signed)
Spoke with patients son, gave a verbal understanding to hold the mucinex and follow up with Dr.Hunter

## 2021-01-13 ENCOUNTER — Encounter: Payer: Medicare HMO | Admitting: Gynecologic Oncology

## 2021-01-13 DIAGNOSIS — I10 Essential (primary) hypertension: Secondary | ICD-10-CM | POA: Diagnosis not present

## 2021-01-13 DIAGNOSIS — R569 Unspecified convulsions: Secondary | ICD-10-CM | POA: Diagnosis not present

## 2021-01-13 DIAGNOSIS — R410 Disorientation, unspecified: Secondary | ICD-10-CM | POA: Diagnosis not present

## 2021-01-13 DIAGNOSIS — R001 Bradycardia, unspecified: Secondary | ICD-10-CM | POA: Diagnosis not present

## 2021-01-14 ENCOUNTER — Encounter: Payer: Self-pay | Admitting: Family Medicine

## 2021-01-14 ENCOUNTER — Ambulatory Visit (INDEPENDENT_AMBULATORY_CARE_PROVIDER_SITE_OTHER)
Admission: RE | Admit: 2021-01-14 | Discharge: 2021-01-14 | Disposition: A | Discharge status: Home / Self Care | Payer: Medicare HMO | Source: Ambulatory Visit | Attending: Family Medicine | Admitting: Family Medicine

## 2021-01-14 ENCOUNTER — Other Ambulatory Visit: Payer: Self-pay | Admitting: Family Medicine

## 2021-01-14 ENCOUNTER — Ambulatory Visit (INDEPENDENT_AMBULATORY_CARE_PROVIDER_SITE_OTHER): Payer: Medicare HMO | Admitting: Family Medicine

## 2021-01-14 ENCOUNTER — Telehealth: Payer: Self-pay

## 2021-01-14 ENCOUNTER — Other Ambulatory Visit: Payer: Self-pay

## 2021-01-14 VITALS — BP 134/68 | HR 84 | Temp 98.6°F | Ht 65.0 in | Wt 185.0 lb

## 2021-01-14 DIAGNOSIS — I10 Essential (primary) hypertension: Secondary | ICD-10-CM | POA: Diagnosis not present

## 2021-01-14 DIAGNOSIS — R053 Chronic cough: Secondary | ICD-10-CM

## 2021-01-14 DIAGNOSIS — R410 Disorientation, unspecified: Secondary | ICD-10-CM

## 2021-01-14 DIAGNOSIS — I5032 Chronic diastolic (congestive) heart failure: Secondary | ICD-10-CM

## 2021-01-14 DIAGNOSIS — E119 Type 2 diabetes mellitus without complications: Secondary | ICD-10-CM | POA: Diagnosis not present

## 2021-01-14 DIAGNOSIS — I2510 Atherosclerotic heart disease of native coronary artery without angina pectoris: Secondary | ICD-10-CM

## 2021-01-14 DIAGNOSIS — Z794 Long term (current) use of insulin: Secondary | ICD-10-CM

## 2021-01-14 DIAGNOSIS — Z79899 Other long term (current) drug therapy: Secondary | ICD-10-CM | POA: Diagnosis not present

## 2021-01-14 DIAGNOSIS — R059 Cough, unspecified: Secondary | ICD-10-CM | POA: Diagnosis not present

## 2021-01-14 LAB — POC URINALSYSI DIPSTICK (AUTOMATED)
Bilirubin, UA: NEGATIVE
Blood, UA: NEGATIVE
Glucose, UA: NEGATIVE
Ketones, UA: NEGATIVE
Leukocytes, UA: NEGATIVE
Nitrite, UA: NEGATIVE
Protein, UA: POSITIVE — AB
Spec Grav, UA: 1.015 (ref 1.010–1.025)
Urobilinogen, UA: 0.2 E.U./dL
pH, UA: 5.5 (ref 5.0–8.0)

## 2021-01-14 IMAGING — DX DG CHEST 2V
2 series · 2 of 2 positions shown · non-contrast
Comparison: [DATE]

CLINICAL DATA: Chronic cough.  History of COPD.

EXAM:
CHEST - 2 VIEW

[chest pa]
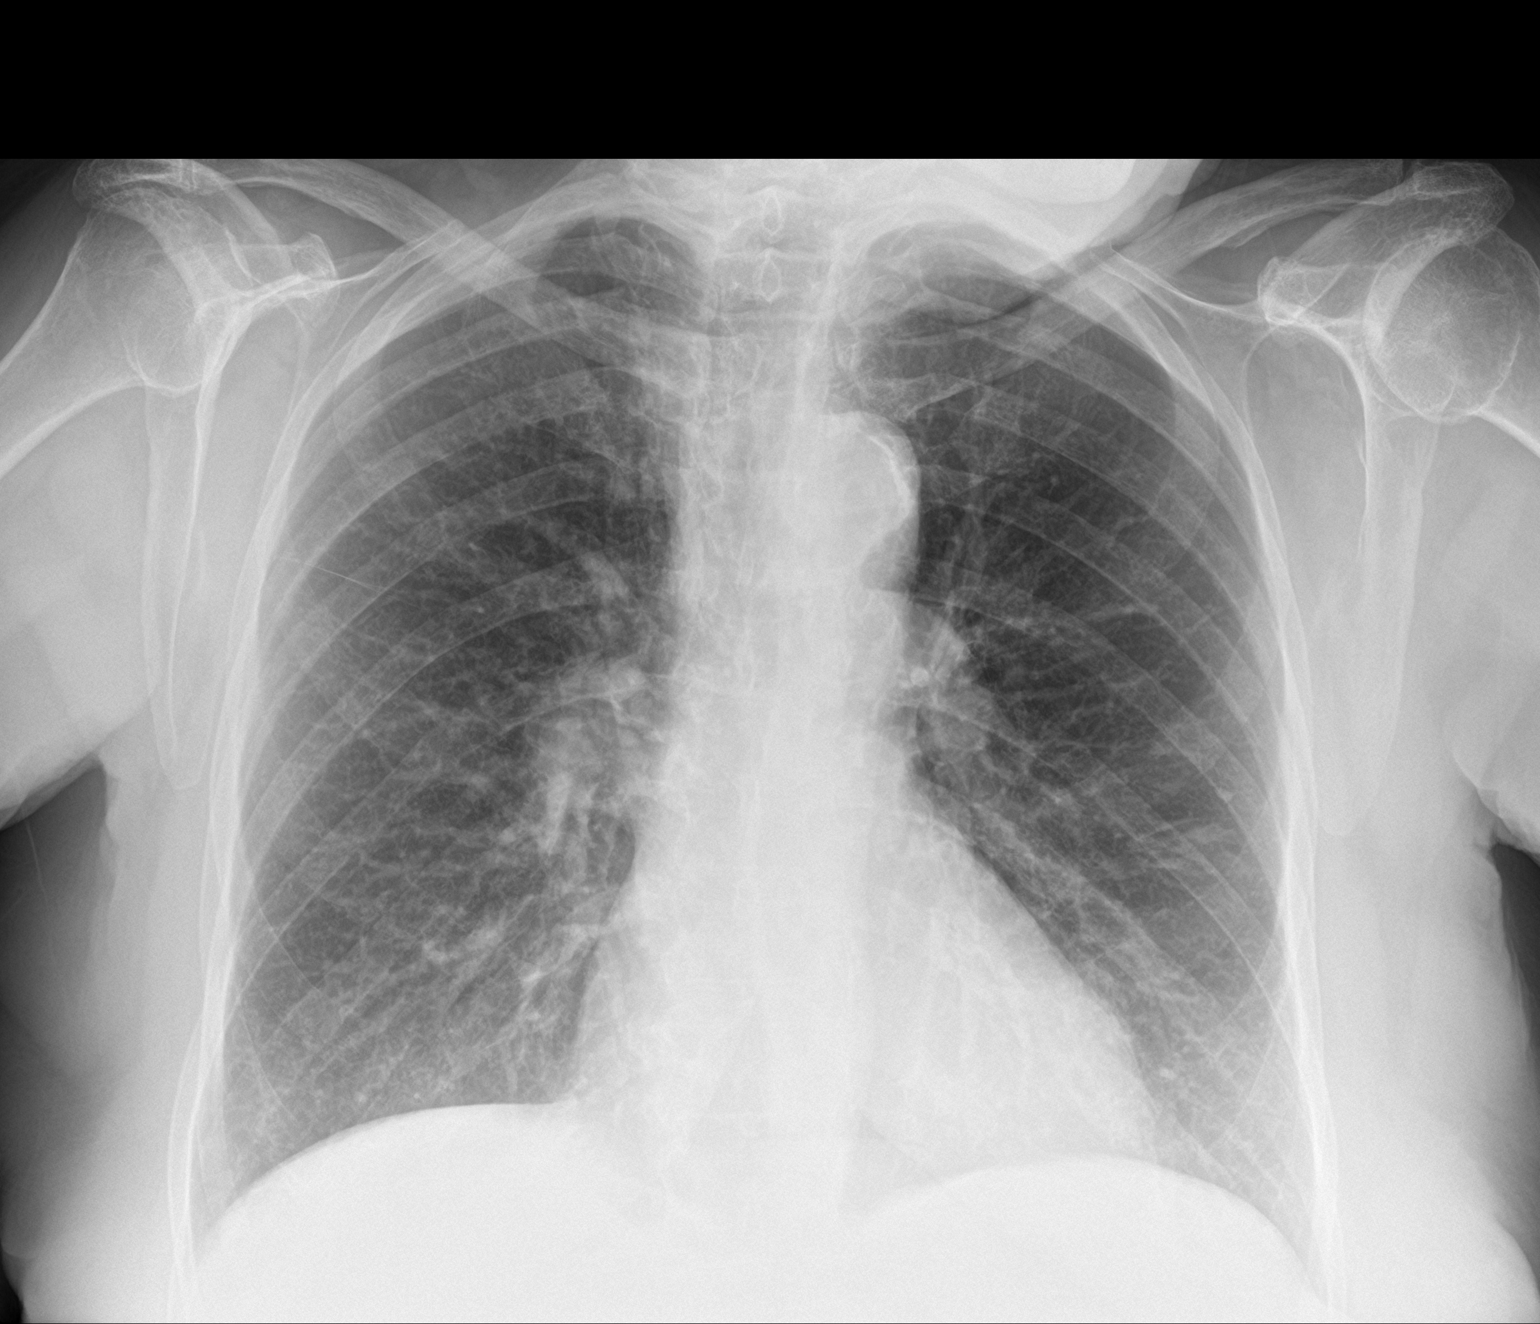

[chest lat]
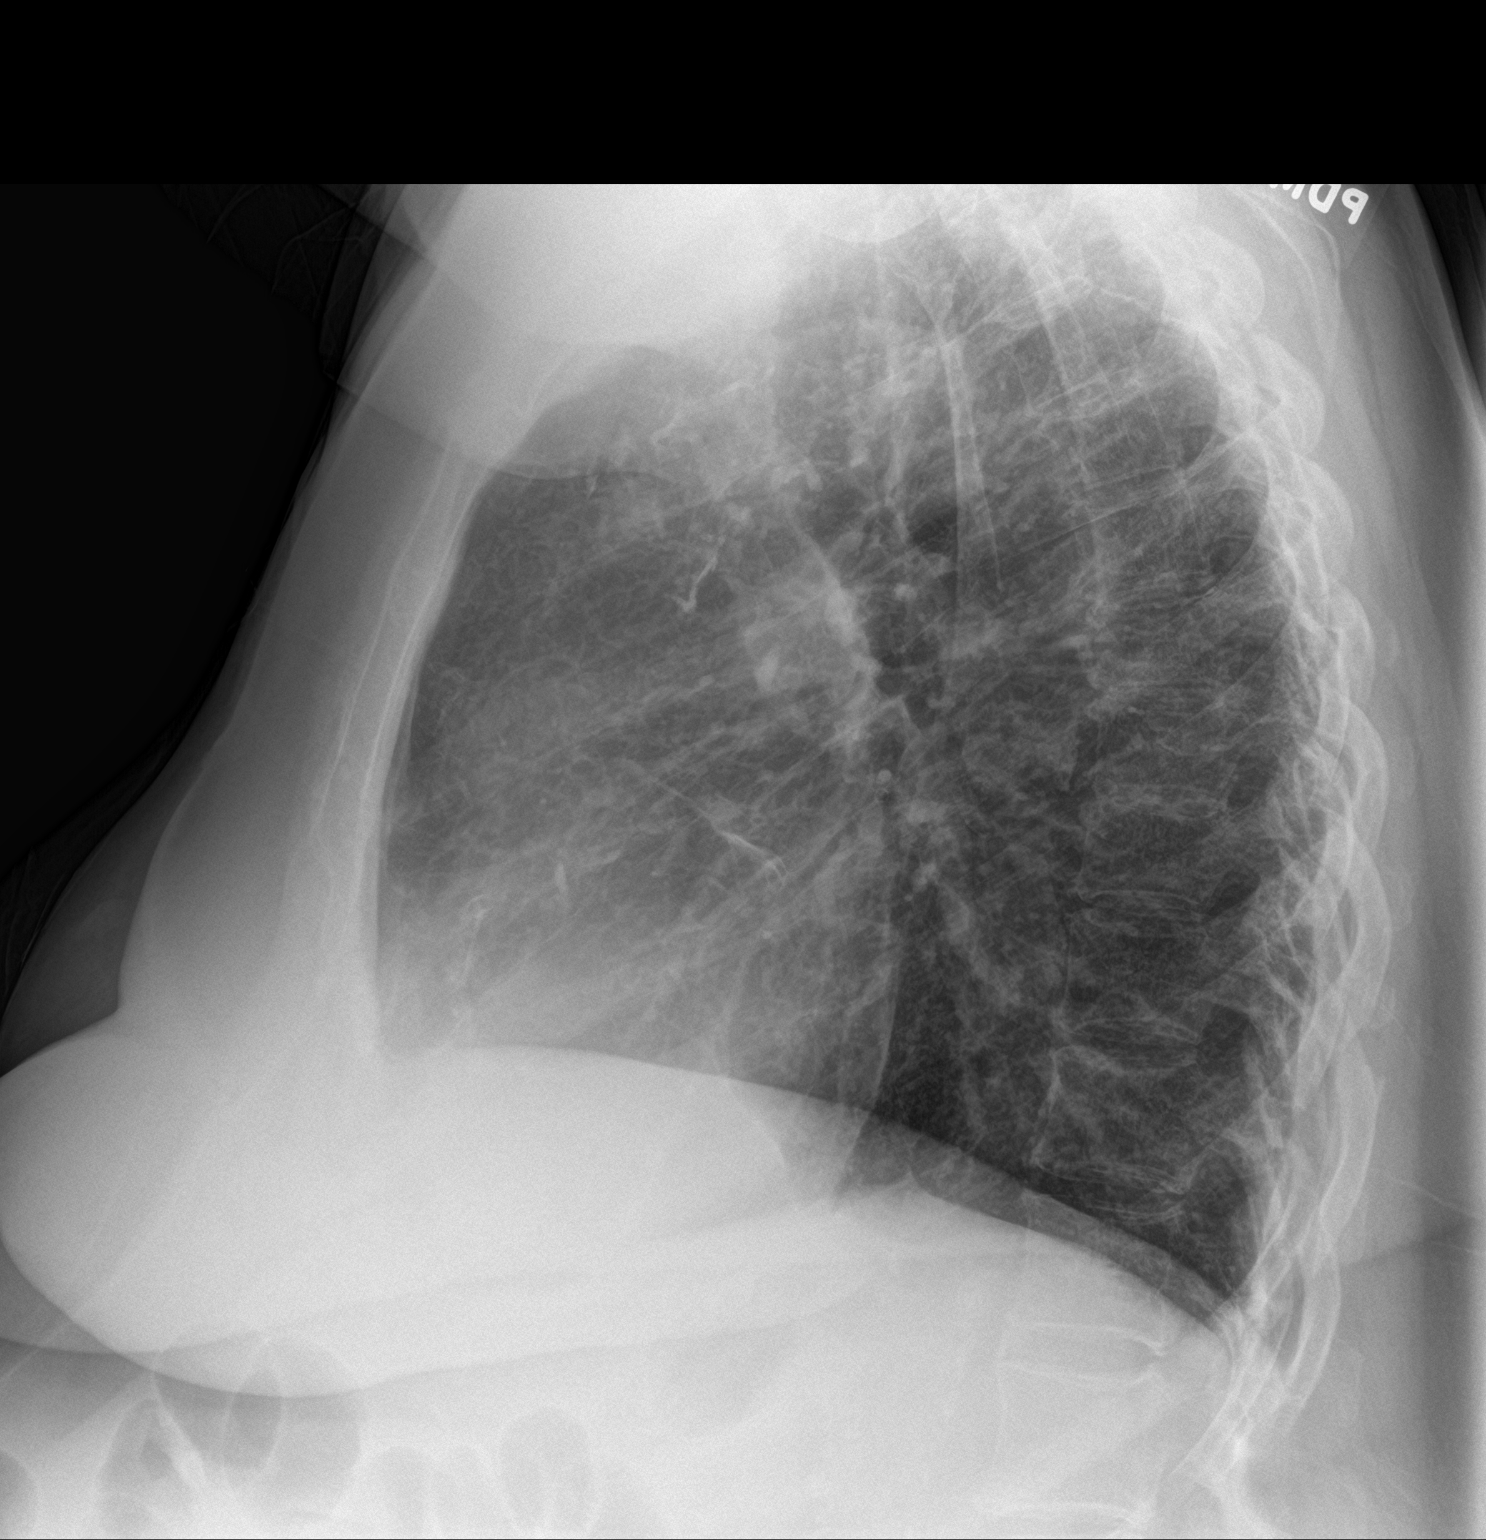

[2 of 2 positions shown; findings below may reference images not displayed]

FINDINGS: The cardiomediastinal silhouette is normal. No pneumothorax. The
right lung is clear. Mild opacity in the left mid lung. A calcified
nodule seen in the lateral left lung. No other acute abnormalities.
IMPRESSION: Mild opacity in left mid lung may represent pneumonia given history.
Recommend short-term follow-up imaging to ensure resolution. Mild
bronchitic change.

## 2021-01-14 MED ORDER — METFORMIN HCL 500 MG PO TABS: 500.0000 mg | ORAL_TABLET | Freq: Two times a day (BID) | ORAL | 3 refills | Status: DC

## 2021-01-14 MED ORDER — METFORMIN HCL ER 500 MG PO TB24: 500.0000 mg | ORAL_TABLET | Freq: Two times a day (BID) | ORAL | 3 refills | Status: DC

## 2021-01-14 NOTE — Telephone Encounter (Signed)
Received a call from patients son, he states Mikaela was having severe confusion and was unresponsive for a few minutes. Was advised to go to the ED but [facility] Guiles kept refusing. They have an appointment tomorrow but are wondering if there is anything else he can do in the meantime to help.

## 2021-01-14 NOTE — Telephone Encounter (Signed)
Patient was moved to today.

## 2021-01-14 NOTE — Addendum Note (Signed)
Addended by: Loura Back on: 01/14/2021 03:34 PM   Modules accepted: Orders

## 2021-01-14 NOTE — Patient Instructions (Addendum)
Health Maintenance Due  Topic Date Due   Zoster Vaccines- Shingrix (1 of 2) Please check with your pharmacy to see if they have the shingrix vaccine. If they do- please get this immunization and update [place] by phone call or mychart with dates you receive the vaccine . Can wait until feeling better Never done   OPHTHALMOLOGY EXAM If you have had your eye exam within the last year, please sign release of information at check out desk. If you have not had an eye exam within a year, please get one at this time as this is important for your diabetes care  04/28/2018   COVID-19 Vaccine (4 - Booster for Moderna series) Schedule this appointment in the fall.  01/03/2021   Start a trial of stopping metformin-instant release completely for one week.   After one week, you may start on metformin-extended release which you should pick up from local [place].   In regards to your sleep schedule, please try to avoid napping during the day time to allow you to have rest at night.  Please go to [facility] (updated 09/12/2019) - located [address] across the street from [place] - in the basement - Hours: 8:30-5:00 PM M-F (with lunch from 12:30- 1 PM). You do NOT need an appointment.  - Please ensure you are covid symptom free before going in(No fever, chills, cough, congestion, runny nose, shortness of breath, fatigue, body aches, sore throat, headache, nausea, vomiting, diarrhea, or new loss of taste or smell. No known contacts with covid 19 or someone being tested for covid 19)  Please stop by lab before you go If you have mychart- we will send your results within 3 business days of [place] receiving them.  If you do not have mychart- we will call you about results within 5 business days of [place] receiving them.  *please also note that you will see labs on mychart as soon as they post. I will later go in and write notes on them- will say "notes from Dr. Yong Channel"  Recommended follow  up: keep august visit unless new or worsening symptoms

## 2021-01-14 NOTE — Telephone Encounter (Signed)
See below

## 2021-01-14 NOTE — Progress Notes (Signed)
Phone [phone] In person visit   Subjective:   Michelle [name] is a 87 y.o. year old very pleasant female patient who presents for/with See problem oriented charting Chief Complaint  Patient presents with   Confusion     [name] states that he has EMS to the house last night twice due to Ms.Hantz being very confused. He states that she was in the bathroom and she couldn't figure out where the sink in the bathroom was. Another incident from last night was when she was on the phone with david and she couldn't remember what they were talking about. Her son wanted her to go to the ER lastnight but she wouldn't.    This visit occurred during the SARS-CoV-2 public health emergency.  Safety protocols were in place, including screening questions prior to the visit, additional usage of staff PPE, and extensive cleaning of exam room while observing appropriate contact time as indicated for disinfecting solutions.   Past Medical History-  Patient Active Problem List   Diagnosis Date Noted   Memory loss 10/22/2020    Priority: High   Delirium 06/25/2020    Priority: High   Chronic diastolic CHF (congestive heart failure) ([place]) 03/13/2014    Priority: High   CAD- RCA PCI '90s, STEMI-RCA DES 02/18/14 03/05/2014    Priority: High   Type 2 diabetes mellitus without complication, with long-term current use of insulin ([place]) 03/26/2007    Priority: High   Anemia, iron deficiency 10/23/2015    Priority: Medium   CKD (chronic kidney disease), stage III ([place]) 07/25/2014    Priority: Medium   Osteoarthritis, knee 05/13/2014    Priority: Medium   Anxiety state 04/21/2014    Priority: Medium   SOB (shortness of breath) 03/05/2014    Priority: Medium   Hyperlipidemia associated with type 2 diabetes mellitus ([place]) 03/26/2007    Priority: Medium   Essential hypertension 04/21/2006    Priority: Medium   COPD (chronic obstructive pulmonary disease) ([place]) 04/21/2006    Priority: Medium   Actinic  keratosis 11/17/2017    Priority: Low   Back pain, lumbosacral 10/10/2012    Priority: Low   Adnexal mass 11/24/2020   Thickened endometrium 11/24/2020    Medications- reviewed and updated Current Outpatient Medications  Medication Sig Dispense Refill   Accu-Chek FastClix Lancets MISC Use to test blood sugars daily. Dx: E11.9 100 each 2   ACCU-CHEK SMARTVIEW test strip TEST BLOOD SUGAR THREE TIMES DAILY 300 strip 1   acetaminophen (TYLENOL) 650 MG CR tablet Take 1,300 mg by mouth every 8 (eight) hours as needed for pain.     albuterol (PROVENTIL) (2.5 MG/3ML) 0.083% nebulizer solution USE THREE MILLILITERS VIA NEBULIZATION BY MOUTH EVERY 6 HOURS AS NEEDED FOR WHEEZING OR SHORTNESS OF BREATH (Patient taking differently: Take 2.5 mg by nebulization every 6 (six) hours as needed for wheezing or shortness of breath.) 75 mL 3   albuterol (VENTOLIN HFA) 108 (90 Base) MCG/ACT inhaler Inhale 1 puff into the lungs every 4 (four) hours as needed for wheezing or shortness of breath (RESCUE inhaler if advair not working). 18 g 5   Alcohol Swabs (B-D SINGLE USE SWABS REGULAR) PADS USE  FOR  TESTING THREE TIMES DAILY 300 each 1   amLODipine (NORVASC) 10 MG tablet TAKE 1 TABLET EVERY DAY (Patient taking differently: Take 10 mg by mouth daily.) 90 tablet 3   aspirin 81 MG tablet Take 81 mg by mouth daily with breakfast.     Blood  Glucose Calibration (ACCU-CHEK SMARTVIEW CONTROL) LIQD      blood glucose meter kit and supplies KIT Dispense based on patient and insurance preference. Use up to four times daily as directed. (FOR ICD-9 250.00, 250.01). 1 each 0   calcium carbonate (OSCAL) 1500 (600 Ca) MG TABS tablet Take 600 mg of elemental calcium by mouth daily with breakfast.     carvedilol (COREG) 12.5 MG tablet Take 1 tablet (12.5 mg total) by mouth 2 (two) times daily. 60 tablet 3   cholecalciferol (VITAMIN D3) 25 MCG (1000 UNIT) tablet Take 1,000 Units by mouth daily with breakfast.     clopidogrel  (PLAVIX) 75 MG tablet TAKE 1 TABLET EVERY DAY 90 tablet 1   diclofenac Sodium (VOLTAREN) 1 % GEL APPLY 2 GRAMS TOPICALLY TO AFFECTED AREA 4 TIMES DAILY 300 g 3   DROPLET PEN NEEDLES 31G X 8 MM MISC USE AS DIRECTED WITH LANTUS SOLOSTAR PEN 100 each 4   enalapril (VASOTEC) 20 MG tablet TAKE 1 TABLET TWICE DAILY (Patient taking differently: Take 20 mg by mouth 2 (two) times daily.) 180 tablet 3   fenofibrate micronized (LOFIBRA) 134 MG capsule Take 1 capsule (134 mg total) by mouth daily. 90 capsule 3   ferrous sulfate 325 (65 FE) MG tablet Twice a week (Patient taking differently: Take 325 mg by mouth 2 (two) times a week.) 18 tablet 3   Fluticasone-Salmeterol (ADVAIR) 250-50 MCG/DOSE AEPB INHALE 1 PUFF TWICE DAILY (Patient taking differently: Inhale 1 puff into the lungs in the morning and at bedtime.) 937 each 3   folic acid (FOLVITE) 169 MCG tablet Take 400 mcg by mouth daily with breakfast.     furosemide (LASIX) 20 MG tablet Take 1 tablet (20 mg total) by mouth daily. 90 tablet 2   hydrALAZINE (APRESOLINE) 25 MG tablet TAKE ONE TABLET BY MOUTH EVERY MORNING AS NEEDED FOR HIGH BLOOD PRESSURE 90 tablet 1   hydrALAZINE (APRESOLINE) 50 MG tablet Take 1 tablet (50 mg total) by mouth every 8 (eight) hours. (Patient taking differently: Take 50 mg by mouth in the morning and at bedtime.) 90 tablet 3   lamoTRIgine (LAMICTAL) 25 MG tablet TAKE ONE TABLET BY MOUTH AT BEDTIME (Patient taking differently: Take 25 mg by mouth at bedtime.) 30 tablet 1   Lancets Misc. (ACCU-CHEK FASTCLIX LANCET) KIT 1 Device by Does not apply route. Use as directed for testing blood sugar     Lancets Misc. (ACCU-CHEK FASTCLIX LANCET) KIT Use as directed to test blood sugar 1 kit 1   LANTUS SOLOSTAR 100 UNIT/ML Solostar Pen INJECT  17  TO  20 UNITS SUBCUTANEOUSLY EVERY MORNING (Patient taking differently: Inject 18 Units into the skin 2 (two) times daily.) 30 mL 5   loratadine (CLARITIN) 10 MG tablet Take 10 mg by mouth every other  day.     Melatonin 5 MG CAPS Take 5 mg by mouth at bedtime.     metFORMIN (GLUCOPHAGE-XR) 500 MG 24 hr tablet Take 1 tablet (500 mg total) by mouth in the morning and at bedtime. 180 tablet 3   nitroGLYCERIN (NITROSTAT) 0.4 MG SL tablet PLACE 1 TABLET (0.4 MG TOTAL) UNDER THE TONGUE EVERY 5 (FIVE) MINUTESAS NEEDED FOR CHEST PAIN (CHEST PAIN OR SHORTNESS OF BREATH). (Patient taking differently: Place 0.4 mg under the tongue every 5 (five) minutes as needed for chest pain (max 3 doses).) 25 tablet 6   pantoprazole (PROTONIX) 40 MG tablet TAKE ONE TABLET BY MOUTH DAILY (Patient taking differently: Take 40  mg by mouth daily with breakfast.) 90 tablet 1   rosuvastatin (CRESTOR) 40 MG tablet TAKE 1 TABLET EVERY DAY 90 tablet 0   sertraline (ZOLOFT) 50 MG tablet Take 2 tablets (100 mg total) by mouth at bedtime. (Patient taking differently: Take 100 mg by mouth daily.) 60 tablet 0   spironolactone (ALDACTONE) 25 MG tablet Take 1 tablet (25 mg total) by mouth daily. (Patient taking differently: Take 25 mg by mouth daily with breakfast.) 90 tablet 3   tiotropium (SPIRIVA HANDIHALER) 18 MCG inhalation capsule Place 1 capsule (18 mcg total) into inhaler and inhale daily. (Patient taking differently: Place 18 mcg into inhaler and inhale daily before breakfast.) 90 capsule 2   vitamin B-12 (CYANOCOBALAMIN) 1000 MCG tablet Take 1,000 mcg by mouth daily with breakfast.     vitamin E 400 UNIT capsule Take 400 Units by mouth at bedtime.     icosapent Ethyl (VASCEPA) 1 g capsule Take 2 capsules (2 g total) by mouth 2 (two) times daily. (Patient not taking: Reported on 01/14/2021) 360 capsule 3   oxyCODONE (OXY IR/ROXICODONE) 5 MG immediate release tablet Take 0.5-1 tablets (2.5-5 mg total) by mouth every 6 (six) hours as needed for severe pain. For AFTER surgery only, do not take and drive (Patient not taking: No sig reported) 10 tablet 0   senna-docusate (SENOKOT-S) 8.6-50 MG tablet Take 2 tablets by mouth at bedtime.  For AFTER surgery, do not take if having diarrhea (Patient not taking: No sig reported) 30 tablet 0   No current facility-administered medications for this visit.     Objective:  BP 134/68   Pulse 84   Temp 98.6 F (37 C) (Temporal)   Ht '5\' 5"'  (1.651 m)   Wt 185 lb (83.9 kg)   SpO2 96%   BMI 30.79 kg/m  Gen: NAD, resting comfortably CV: RRR no murmurs rubs or gallops Lungs: CTAB  except slightly diminished on the left side compared to right. no crackles, wheeze, rhonchi.  Abdomen: soft/nontender/nondistended/normal bowel sounds. Ext: no edema Skin: warm, dry    Assessment and Plan  # Confusion last night #History of delirium with concern for underlying dementia S:patient presents today reporting that she is experiencing confusion at times such as last night.   The first incident she was on the phone with her son and she could not remember what she was talking about- apparently she jus thad phone in hand shaking head when other son [name] came in .  The second occasion she was in the bathroom and could not figure out where the sink in the bathroom was.  She had not taken nighttime pills yet including zyrtec. No rhythmic shaking when she had the confusion. Blood sugar was not low when they came out- was 170.   [name] reported he had EMS out to the house twice last night due to confusion. By time EMS would come out she would improve.   Family encouraged ER trip but she declined. EMS came out twice and vital signs wer stable other than mildly elevated blood pressure. EKG with sinus rhythm but fist degree av block rate 91, left axis,  no st or t wave changes.   -in the past she had a visit with neurology Dr. Delice Lesch and MoCA score was significantly decreased at 14 out of 30- plan was for 6 to 7 month follow-up. We did discuss having potential diagnosis of dementia on 10/22/2020.   - Patient and her son thought her memory  was  improving as she gets further out from hospitalization.  They report this is the worst her memory has been since getting out of hospital but she is much more clear today.   -we wanted to wait for an opinion form Dr. Delice Lesch from the follow-up- visit scheduled in mid july  Of note patient also following up with [facility]fisher for large cystic mass in left adnexa 9.9 x 9.8 x 8.3 cm and had surgery planned on June 9 which was not completed-patient was fearful she would not make it out of surgery.  On endometrial biopsy was negative for cancer but family is aware this does not address the adnexal mass.  Regardless they.Canceled surgery.  She has no further bleeding or abdominal pain.  Plan currently is for 80-monthultrasound as she wanted to start with this instead of MRI  -Patient also saw Dr. AJonni Sangera week ago for chronic cough-Dr. AJonni Sangerthought this was chronic and likely multifactorial.  Recommended trial of Zyrtec nightly and plain Mucinex twice a day and encouraged her to continue her COPD medications including Advair.  No wheezing and albuterol has not been effective for cough-was not thought to be COPD exacerbation.  Was having some ankle edema and heart failure was thought to potentially be contributing-they did not make changes to amlodipine at that time.   - off and on diarrhea at least since most recent hospitalization.  A/P: Patient may have underlying dementia-has upcoming visit with Dr. ADelice Leschto evaluate further.  May need neuropsychological testing. - She did have a more acute issue last night with increased confusion-I wonder about potential sundowning - with chronic cough with recent worsening will get chest x-ray -will also get UA and culture to evaluate infection -has had loose stools on metformin- see diabetes section below- this could throw off her electrolytes -update labs as below  #Resistant hypertension S: medication: Amlodipine 10 mg, carvedilol 12.5 mg BID, enalapril 20 mg twice daily, lasix 20 mg daily, hydralazine 25 mg Am with 50 mg  every 8 hours after, spironolactone 25 mg BP Readings from Last 3 Encounters:  01/14/21 134/68  01/07/21 133/73  11/24/20 (!) 110/50  A/P: stable on repeat- continue current meds  #CAD #hyperlipidemia S: Medication: Plavix 75 mg, Aspirin 81 mg, Rosuvastatin 40 mg Lab Results  Component Value Date   CHOL 128 02/18/2020   HDL 49 (L) 02/18/2020   LDLCALC 42 02/18/2020   LDLDIRECT 40.0 07/23/2019   TRIG 355 (H) 02/18/2020   CHOLHDL 2.6 02/18/2020   A/P: CAD- asymptomatic - continue current meds. Hyperlipidemia-at goal last visit- continue current meds   # Diabetes S: Medication: Lantus 18 units plus metformin 1000 mg twice daily but diarrhea for several months Lab Results  Component Value Date   HGBA1C 6.5 (H) 08/26/2020   HGBA1C 6.6 (H) 06/25/2020   HGBA1C 6.6 (H) 02/18/2020    A/P: Diabetes has been well controlled.  Update A1c with labs today.  We are going to continue Lantus at 18 units.  With her loose stools we are going to hold metformin for 1 week and then restart with metformin extended release 500 mg twice daily down from 1000 mg twice daily of instant release  # B12 deficiency S: Current treatment/medication (oral vs. IM):  patient maintains oral B-12 Lab Results  Component Value Date   VITAMINB12 189 08/19/2020   A/P: hopefully improved- was low last visit- on orals now- check with labs   # Chronic diastolic CHF S: continues to despite being  on amlodipine- this may be counterbalanced by spironolactone and Lasix 20 mg in regards to today-no signs of fluid overload- stable edema -weight is down from last visit a few lbs A/P: based on weight and no edema- CHF appears stable- continue current meds   # Postmenopausal bleeding- patient has seen GYN in the past- see discussion above- right now procedure on hold. Endometrial biopsy is ok. Has adnexal mass- monitoring instead of surgery due to concern if she could handle surgeyr   Recommended follow up: No follow-ups on  file. Future Appointments  Date Time Provider [place]  02/02/2021  3:30 PM [name], MD [facility] None  02/24/2021  1:30 PM [facility] 1 [facility]   02/26/2021  3:45 PM [name], MD [facility] None  03/12/2021  4:20 PM [name], MD [facility]  04/06/2021 11:30 AM [name], MD [facility] None  06/07/2021  2:30 PM [facility] HEALTH COACH [facility]    Lab/Order associations:   ICD-10-CM   1. Chronic cough  R05.3 DG Chest 2 View    2. Confusion  R41.0 POCT Urinalysis Dipstick (Automated)    Urine Culture    3. Essential hypertension  I10 CBC with Differential/Platelet    Comprehensive metabolic panel    4. Type 2 diabetes mellitus without complication, with long-term current use of insulin (HCC)  E11.9 Hemoglobin A1c   Z79.4     5. High risk medication use  Z79.899 Vitamin B12      Meds ordered this encounter  Medications   DISCONTD: metFORMIN (GLUCOPHAGE) 500 MG tablet    Sig: Take 1 tablet (500 mg total) by mouth 2 (two) times daily with a meal.    Dispense:  180 tablet    Refill:  3   metFORMIN (GLUCOPHAGE-XR) 500 MG 24 hr tablet    Sig: Take 1 tablet (500 mg total) by mouth in the morning and at bedtime.    Dispense:  180 tablet    Refill:  3   I,Harris Phan,acting as a scribe for [name], MD.,have documented all relevant documentation on the behalf of [name], MD,as directed by  [name], MD while in the presence of [name], MD.  I, [name], MD, have reviewed all documentation for this visit. The documentation on 01/14/21 for the exam, diagnosis, procedures, and orders are all accurate and complete.  Time Spent: 43 minutes of total time (2:20 PM- 3:03 PM) was spent on the date of the encounter performing the following actions: chart review prior to seeing the patient, obtaining history, performing a medically necessary exam, counseling on the treatment plan, placing orders, and documenting in our  EHR.   Return precautions advised.  [name], MD

## 2021-01-15 ENCOUNTER — Ambulatory Visit: Payer: Medicare HMO | Admitting: Family Medicine

## 2021-01-15 LAB — CBC WITH DIFFERENTIAL/PLATELET
Basophils Absolute: 0.1 10*3/uL (ref 0.0–0.1)
Basophils Relative: 1.2 % (ref 0.0–3.0)
Eosinophils Absolute: 0 10*3/uL (ref 0.0–0.7)
Eosinophils Relative: 0.4 % (ref 0.0–5.0)
HCT: 38.2 % (ref 36.0–46.0)
Hemoglobin: 12.9 g/dL (ref 12.0–15.0)
Lymphocytes Relative: 20.3 % (ref 12.0–46.0)
Lymphs Abs: 2.2 10*3/uL (ref 0.7–4.0)
MCHC: 33.7 g/dL (ref 30.0–36.0)
MCV: 87.5 fl (ref 78.0–100.0)
Monocytes Absolute: 1.1 10*3/uL — ABNORMAL HIGH (ref 0.1–1.0)
Monocytes Relative: 10 % (ref 3.0–12.0)
Neutro Abs: 7.3 10*3/uL (ref 1.4–7.7)
Neutrophils Relative %: 68.1 % (ref 43.0–77.0)
Platelets: 272 10*3/uL (ref 150.0–400.0)
RBC: 4.37 Mil/uL (ref 3.87–5.11)
RDW: 13.2 % (ref 11.5–15.5)
WBC: 10.7 10*3/uL — ABNORMAL HIGH (ref 4.0–10.5)

## 2021-01-15 LAB — COMPREHENSIVE METABOLIC PANEL
ALT: 13 U/L (ref 0–35)
AST: 16 U/L (ref 0–37)
Albumin: 4.6 g/dL (ref 3.5–5.2)
Alkaline Phosphatase: 39 U/L (ref 39–117)
BUN: 25 mg/dL — ABNORMAL HIGH (ref 6–23)
CO2: 26 mEq/L (ref 19–32)
Calcium: 9.9 mg/dL (ref 8.4–10.5)
Chloride: 100 mEq/L (ref 96–112)
Creatinine, Ser: 0.95 mg/dL (ref 0.40–1.20)
GFR: 54.43 mL/min — ABNORMAL LOW (ref >60.00)
Glucose, Bld: 177 mg/dL — ABNORMAL HIGH (ref 70–99)
Potassium: 3.8 mEq/L (ref 3.5–5.1)
Sodium: 138 mEq/L (ref 135–145)
Total Bilirubin: 0.3 mg/dL (ref 0.2–1.2)
Total Protein: 7.6 g/dL (ref 6.0–8.3)

## 2021-01-15 LAB — URINE CULTURE
MICRO NUMBER:: 12070131
SPECIMEN QUALITY:: ADEQUATE

## 2021-01-15 LAB — HEMOGLOBIN A1C: Hgb A1c MFr Bld: 6.4 % (ref 4.6–6.5)

## 2021-01-15 LAB — VITAMIN B12: Vitamin B-12: 598 pg/mL (ref 211–911)

## 2021-01-16 ENCOUNTER — Other Ambulatory Visit: Payer: Self-pay | Admitting: Family Medicine

## 2021-01-16 MED ORDER — AMOXICILLIN-POT CLAVULANATE 875-125 MG PO TABS: 1.0000 | ORAL_TABLET | Freq: Two times a day (BID) | ORAL | 0 refills | Status: AC

## 2021-01-16 MED ORDER — AZITHROMYCIN 250 MG PO TABS: ORAL_TABLET | ORAL | 0 refills | Status: DC

## 2021-01-19 ENCOUNTER — Telehealth: Payer: Self-pay

## 2021-01-19 DIAGNOSIS — R053 Chronic cough: Secondary | ICD-10-CM

## 2021-01-19 DIAGNOSIS — J189 Pneumonia, unspecified organism: Secondary | ICD-10-CM

## 2021-01-19 NOTE — Telephone Encounter (Signed)
Order has been placed for repeat chest xray.

## 2021-01-19 NOTE — Telephone Encounter (Signed)
Sent pt a mychart message. 

## 2021-01-21 ENCOUNTER — Other Ambulatory Visit: Payer: Self-pay | Admitting: Neurology

## 2021-01-25 ENCOUNTER — Other Ambulatory Visit: Payer: Self-pay | Admitting: Neurology

## 2021-01-25 ENCOUNTER — Other Ambulatory Visit: Payer: Self-pay | Admitting: Family Medicine

## 2021-01-25 DIAGNOSIS — I5032 Chronic diastolic (congestive) heart failure: Secondary | ICD-10-CM | POA: Diagnosis not present

## 2021-01-25 DIAGNOSIS — Z515 Encounter for palliative care: Secondary | ICD-10-CM | POA: Diagnosis not present

## 2021-01-25 DIAGNOSIS — R197 Diarrhea, unspecified: Secondary | ICD-10-CM | POA: Diagnosis not present

## 2021-01-28 ENCOUNTER — Telehealth: Payer: Self-pay | Admitting: Neurology

## 2021-01-29 ENCOUNTER — Other Ambulatory Visit: Payer: Self-pay

## 2021-01-29 MED ORDER — LAMOTRIGINE 25 MG PO TABS: 25.0000 mg | ORAL_TABLET | Freq: Every day | ORAL | 1 refills | Status: DC

## 2021-01-29 NOTE — Telephone Encounter (Signed)
Pt's son called in stating the pt is out of her lamotrigine. She took her last one last night and he was told the refill was denied. He would like to know why.

## 2021-01-29 NOTE — Telephone Encounter (Signed)
Patient advised and refill sent has appt on tuesday

## 2021-01-29 NOTE — Telephone Encounter (Signed)
We did not deny, not sure why, pls send refills and let him know, thanks

## 2021-02-02 ENCOUNTER — Ambulatory Visit: Payer: Medicare HMO | Admitting: Neurology

## 2021-02-02 ENCOUNTER — Encounter: Payer: Self-pay | Admitting: Neurology

## 2021-02-02 ENCOUNTER — Other Ambulatory Visit: Payer: Self-pay

## 2021-02-02 VITALS — BP 133/85 | HR 72 | Ht 65.0 in | Wt 188.2 lb

## 2021-02-02 DIAGNOSIS — F0150 Vascular dementia without behavioral disturbance: Secondary | ICD-10-CM

## 2021-02-02 DIAGNOSIS — G40009 Localization-related (focal) (partial) idiopathic epilepsy and epileptic syndromes with seizures of localized onset, not intractable, without status epilepticus: Secondary | ICD-10-CM | POA: Diagnosis not present

## 2021-02-02 MED ORDER — LAMOTRIGINE 25 MG PO TABS: 25.0000 mg | ORAL_TABLET | Freq: Two times a day (BID) | ORAL | 3 refills | Status: DC

## 2021-02-02 NOTE — Patient Instructions (Signed)
Good to see you!  Increase Lamotrigine 25mg : Take 1 tablet in AM, 1 tablet in PM  2. Keep a seizure calendar  3. Follow-up in 6 months, call for any changes   Seizure Precautions: 1. If medication has been prescribed for you to prevent seizures, take it exactly as directed.  Do not stop taking the medicine without talking to your doctor first, even if you have not had a seizure in a long time.   2. Avoid activities in which a seizure would cause danger to yourself or to others.  Don't operate dangerous machinery, swim alone, or climb in high or dangerous places, such as on ladders, roofs, or girders.  Do not drive unless your doctor says you may.  3. If you have any warning that you may have a seizure, lay down in a safe place where you can't hurt yourself.    4.  No driving for 6 months from last seizure, as per [facility].   Please refer to the following link on the [place] website for more information: [email or web address]   5.  Maintain good sleep hygiene.  6.  Contact your doctor if you have any problems that may be related to the medicine you are taking.  7.  Call 911 and bring the patient back to the ED if:        A.  The seizure lasts longer than 5 minutes.       B.  The patient doesn't awaken shortly after the seizure  C.  The patient has new problems such as difficulty seeing, speaking or moving  D.  The patient was injured during the seizure  E.  The patient has a temperature over 102 F (39C)  F.  The patient vomited and now is having trouble breathing

## 2021-02-02 NOTE — Progress Notes (Signed)
Patient was seen, evaluated, and treatment plan was discussed with the Advanced Practice Provider. His 24-hour EEG in 09/2020 showed focal slowing over the bilateral temporal regions, left more than right, occasional left frontotemporal epileptiform discharges.She was initially started on Depakote but after taking 1 dose, she had diarrhea and stopped it. She was started on low dose Lamotrigine 25mg  qhs, no side effects. Her son reports that she had 2 episodes in June that occurred an hour apart, where she was looking around confused, unable to answer questions or follow instructions. The first occurred while she was sitting on a chair, then an hour later she had another in the bathroom and he had to come looking for her because she was taking a long time. No falls. No further episodes of loss of consciousness with sonorous respirations since January 2022. Her son [name] administers medications. MMSE today 20/30.   Discussed increasing Lamotrigine to 25mg  BID. This is still a low dose, we can uptitrate as tolerated. Continue close supervision, home safety. She does not drive. Follow-up in 6 months, call for any changes.

## 2021-02-02 NOTE — Progress Notes (Signed)
_0 @   Assessment/Plan:   Left temporal lobe epilepsy focal cerebral dysfunction over the bilateral temporal regions suggestive of underlying structural or physiologic abnormality. There is a tendency for seizures to arise from the left frontotemporal region.  Increase Lamotrigine 71m: Take 1 tablet in AM, 1 tablet in PM, side effects were discussed Keep a seizure calendar  Dementia,  likely vascular  MMSE today is 20/30=MoCA 13/30 ( on 09/2020 was 14/30) essentially unchanged.  Will continue to monitor. Discussed safety both in and out of the home.  Discussed the importance of regular daily schedule with inclusion of crossword puzzles to maintain brain function.  Continue to monitor mood Stay active at least 30 minutes at least 3 times a week.  Naps should be scheduled and should be no longer than 60 minutes and should not occur after 2 PM.  Mediterranean diet is recommended   Follow up in 6 months.   Case discussed with Dr. ADelice Leschwho agrees with the plan      Subjective:   ED visits since last seen: none  Hospital admissions: none  RLAKELY ELMENDORFis a 87y.o. right-handed female with a history of hypertension, hyperlipidemia, diabetes, CAD, first-degree heart block, and a possible history of seizures.  She also has a history of memory loss felt to be due to dementia, possibly vascular.  She is seen today in follow-up.  This patient is accompanied in the office by her son who supplements the history.  Previous records as well as any outside records available were reviewed prior to todays visit.    Since her last visit, the patient may have had 2 episodes on the same day, in June of this year, where she had a "blank stare ", lasting about 15 minutes as reported by son.  The patient has no recall of these events.  Apparently, the EMS came wanting to take her to the ER, and she refused after coming out of.  No report of excess salivation, body jerks, urination during this  period. No olfactory or auditory hallucinations. No further recurrence.  Her mood is good, denies any depression or irritability.  She spends most of the day watching TV when her son is not at home.  He lives with her, and most of the interaction is with him.  She has another son that comes to visit her occasionally.  She is not interested in participating in any group programs such as wellsprings. She sleeps most of the time "ok", some nights she stays awake longer.Denies vivid dreams or sleepwalking.  No hallucinations or paranoia.  She is independent of bathing and changing.  Her son is in charge of the medications and finances.  Her appetite is good, denies trouble swallowing.  She does not cook.  She uses the walker when not in the wheelchair.  She does not drive.  Denies headaches, falls, double vision, dizziness, focal numbness or tingling, unilateral weakness or tremors.   She has intermittent diarrhea and incontinence, she is now wearing depends.    HISTORY OF PRESENT ILLNESS: This is an 87year old right-handed woman with a history of hypertension, hyperlipidemia, diabetes, CAD, 1st degree heart block, presenting for evaluation of confusion. Her son RLouie Casais present to provide additional information. She states "I don't remember everything too well." RLouie Casareports that for the most part she is doing okay. She has more confusion when she is admitted in the hospital, in November she did not even recognize RLouie Casaand kept thinking he  was his brother for a few days. He notices memory issues on occasion. She has been living with [name] for over 60 years. Her other son was managing her medications because she is on so many, then during the pandemic, [name] took over 2 years ago. He manages meals. Her other son has been managing her finances for the past 3 years, "he just wanted to help." She stopped driving in 7209 because it got to where she did not know what she was doing, she could not remember what was  supposed to be beside of her. [name] reports it was due to her vision. She is independent with dressing and bathing.He states she does not repeat herself for the most part. In December she had more confusion, not knowing what was going on. She was brought to the ER and had an MRI brain without contrast which did not show any acute changes, there was mild to moderate diffuse atrophy and chronic microvascular disease. She was admitted to [facility] in January 2022 when he heard her snoring very loudly, he came to find her with eyes wide open, jaw clenched doing the snoring sound. She was unresponsive with body stiff. EMS got some response and she was confused in the ER wanting to go home. BP was 186/81. She had an EEG which showed intermittent generalized slowing, head CT no acute changes. TSH, B12, RPR normal. She was diagnosed with hypertensive encephalopathy and had delirium in the hospital that improved with Seroquel. [name] reports she has improved since then and has been back to normal. No further similar episodes.   She denies any gaps in time, olfactory/gustatory hallucinations, focal numbness/tingling/weakness, myoclonic jerks. She has diarrhea frequently with some incontinence and plans to start wearing Depends. No bladder incontinence. She cannot smell anything. She has occasional hand tremors that do not affect activities. She has pain in her knees. She denies any headaches, dizziness, diplopia, dysarthria/dysphagia, neck/back pain. She has sleep difficulties, sometimes staying awake all night long. She does not take too many naps in the day. No family history of dementia, significant head injuries, alcohol use. No family history of seizures.    IMPRESSION: This study is suggestive of mild diffuse encephalopathy, nonspecific etiology. No seizures or epileptiform discharges were seen throughout the recording.    IMPRESSION: This 24-hour ambulatory video EEG study is abnormal due to the presence of: 1.  Occasional focal slowing over the left temporal region 2. Rare independent focal slowing over the right temporal region 3. Occasional left frontotemporal epileptiform discharges   CLINICAL CORRELATION of the above findings indicates focal cerebral dysfunction over the bilateral temporal regions suggestive of underlying structural or physiologic abnormality. There is a tendency for seizures to arise from the left frontotemporal region. If further clinical questions remain, inpatient video EEG monitoring may be helpful.    PREVIOUS MEDICATIONS:   CURRENT MEDICATIONS:  Outpatient Encounter Medications as of 02/02/2021  Medication Sig   acetaminophen (TYLENOL) 650 MG CR tablet Take 1,300 mg by mouth every 8 (eight) hours as needed for pain.   albuterol (PROVENTIL) (2.5 MG/3ML) 0.083% nebulizer solution USE THREE MILLILITERS VIA NEBULIZATION BY MOUTH EVERY 6 HOURS AS NEEDED FOR WHEEZING OR SHORTNESS OF BREATH (Patient taking differently: Take 2.5 mg by nebulization every 6 (six) hours as needed for wheezing or shortness of breath.)   albuterol (VENTOLIN HFA) 108 (90 Base) MCG/ACT inhaler Inhale 1 puff into the lungs every 4 (four) hours as needed for wheezing or shortness of breath (RESCUE inhaler if advair  not working).   amLODipine (NORVASC) 10 MG tablet TAKE 1 TABLET EVERY DAY (Patient taking differently: Take 10 mg by mouth daily.)   aspirin 81 MG tablet Take 81 mg by mouth daily with breakfast.   calcium carbonate (OSCAL) 1500 (600 Ca) MG TABS tablet Take 600 mg of elemental calcium by mouth daily with breakfast.   carvedilol (COREG) 12.5 MG tablet Take 1 tablet (12.5 mg total) by mouth 2 (two) times daily.   cholecalciferol (VITAMIN D3) 25 MCG (1000 UNIT) tablet Take 1,000 Units by mouth daily with breakfast.   clopidogrel (PLAVIX) 75 MG tablet TAKE 1 TABLET EVERY DAY   diclofenac Sodium (VOLTAREN) 1 % GEL APPLY 2 GRAMS TOPICALLY TO AFFECTED AREA 4 TIMES DAILY   DROPLET PEN NEEDLES 31G X 8 MM  MISC USE AS DIRECTED WITH LANTUS SOLOSTAR PEN   enalapril (VASOTEC) 20 MG tablet TAKE 1 TABLET TWICE DAILY (Patient taking differently: Take 20 mg by mouth 2 (two) times daily.)   fenofibrate micronized (LOFIBRA) 134 MG capsule Take 1 capsule (134 mg total) by mouth daily.   ferrous sulfate 325 (65 FE) MG tablet Twice a week (Patient taking differently: Take 325 mg by mouth 2 (two) times a week.)   Fluticasone-Salmeterol (ADVAIR) 250-50 MCG/DOSE AEPB INHALE 1 PUFF TWICE DAILY (Patient taking differently: Inhale 1 puff into the lungs in the morning and at bedtime.)   folic acid (FOLVITE) 509 MCG tablet Take 400 mcg by mouth daily with breakfast.   furosemide (LASIX) 20 MG tablet Take 1 tablet (20 mg total) by mouth daily.   hydrALAZINE (APRESOLINE) 25 MG tablet TAKE ONE TABLET BY MOUTH EVERY MORNING AS NEEDED FOR HIGH BLOOD PRESSURE   hydrALAZINE (APRESOLINE) 50 MG tablet Take 1 tablet (50 mg total) by mouth every 8 (eight) hours. (Patient taking differently: Take 50 mg by mouth in the morning and at bedtime.)   icosapent Ethyl (VASCEPA) 1 g capsule Take 2 capsules (2 g total) by mouth 2 (two) times daily.   lamoTRIgine (LAMICTAL) 25 MG tablet Take 1 tablet (25 mg total) by mouth at bedtime.   Lancets Misc. (ACCU-CHEK FASTCLIX LANCET) KIT 1 Device by Does not apply route. Use as directed for testing blood sugar   Lancets Misc. (ACCU-CHEK FASTCLIX LANCET) KIT Use as directed to test blood sugar   LANTUS SOLOSTAR 100 UNIT/ML Solostar Pen INJECT  17  TO  20 UNITS SUBCUTANEOUSLY EVERY MORNING (Patient taking differently: Inject 18 Units into the skin 2 (two) times daily.)   loratadine (CLARITIN) 10 MG tablet Take 10 mg by mouth every other day.   Melatonin 5 MG CAPS Take 5 mg by mouth at bedtime.   metFORMIN (GLUCOPHAGE-XR) 500 MG 24 hr tablet Take 1 tablet (500 mg total) by mouth in the morning and at bedtime.   nitroGLYCERIN (NITROSTAT) 0.4 MG SL tablet PLACE 1 TABLET (0.4 MG TOTAL) UNDER THE TONGUE  EVERY 5 (FIVE) MINUTESAS NEEDED FOR CHEST PAIN (CHEST PAIN OR SHORTNESS OF BREATH). (Patient taking differently: Place 0.4 mg under the tongue every 5 (five) minutes as needed for chest pain (max 3 doses).)   pantoprazole (PROTONIX) 40 MG tablet TAKE ONE TABLET BY MOUTH DAILY (Patient taking differently: Take 40 mg by mouth daily with breakfast.)   rosuvastatin (CRESTOR) 40 MG tablet TAKE 1 TABLET EVERY DAY   sertraline (ZOLOFT) 50 MG tablet Take 2 tablets (100 mg total) by mouth at bedtime. (Patient taking differently: Take 100 mg by mouth daily.)   spironolactone (ALDACTONE) 25 MG  tablet Take 1 tablet (25 mg total) by mouth daily. (Patient taking differently: Take 25 mg by mouth daily with breakfast.)   tiotropium (SPIRIVA HANDIHALER) 18 MCG inhalation capsule Place 1 capsule (18 mcg total) into inhaler and inhale daily. (Patient taking differently: Place 18 mcg into inhaler and inhale daily before breakfast.)   vitamin B-12 (CYANOCOBALAMIN) 1000 MCG tablet Take 1,000 mcg by mouth daily with breakfast.   vitamin E 400 UNIT capsule Take 400 Units by mouth at bedtime.   Accu-Chek FastClix Lancets MISC Use to test blood sugars daily. Dx: E11.9   ACCU-CHEK SMARTVIEW test strip TEST BLOOD SUGAR THREE TIMES DAILY   Alcohol Swabs (B-D SINGLE USE SWABS REGULAR) PADS USE  FOR  TESTING THREE TIMES DAILY   Blood Glucose Calibration (ACCU-CHEK SMARTVIEW CONTROL) LIQD    blood glucose meter kit and supplies KIT Dispense based on patient and insurance preference. Use up to four times daily as directed. (FOR ICD-9 250.00, 250.01).   oxyCODONE (OXY IR/ROXICODONE) 5 MG immediate release tablet Take 0.5-1 tablets (2.5-5 mg total) by mouth every 6 (six) hours as needed for severe pain. For AFTER surgery only, do not take and drive (Patient not taking: No sig reported)   senna-docusate (SENOKOT-S) 8.6-50 MG tablet Take 2 tablets by mouth at bedtime. For AFTER surgery, do not take if having diarrhea (Patient not taking:  No sig reported)   [DISCONTINUED] azithromycin (ZITHROMAX) 250 MG tablet Take 2 tabs on day 1, then 1 tab daily until finished (Patient not taking: Reported on 02/02/2021)   No facility-administered encounter medications on file as of 02/02/2021.     Objective:     PHYSICAL EXAMINATION:    VITALS:   Vitals:   02/02/21 1434  BP: 133/85  Pulse: 72  SpO2: 95%  Weight: 188 lb 3.2 oz (85.4 kg)  Height: 5' 5" (1.651 m)    GEN:  The patient appears stated age and is in NAD. HEENT:  Normocephalic, atraumatic.   Neurological examination:  General: NAD, well-groomed, appears stated age. Orientation: The patient is alert. Oriented to person, place and date , not the day     Cranial nerves: There is good facial symmetry.The speech is fluent and clear  No aphasia or dysarthria. Fund of knowledge is appropriate. Recent and remote memory are impaired. Attention and concentration are reduced.  Able to name objects and repeat phrases.  Hearing is intact to conversational tone.    Sensation: Sensation is intact to light touch throughout Motor: Strength is at least antigravity x4. Tremors: L>R minimal tremor at rest   [facility] Cognitive Assessment  09/22/2020  Visuospatial/ Executive (0/5) 1  Naming (0/3) 2  Attention: Read list of digits (0/2) 2  Attention: Read list of letters (0/1) 0  Attention: Serial 7 subtraction starting at 100 (0/3) 1  Language: Repeat phrase (0/2) 1  Language : Fluency (0/1) 0  Abstraction (0/2) 0  Delayed Recall (0/5) 0  Orientation (0/6) 6  Total 13  Adjusted Score (based on education) 14    MMSE - Mini Mental State Exam 02/02/2021 04/14/2017  Not completed: - (No Data)  Orientation to time 4 -  Orientation to Place 4 -  Registration 3 -  Attention/ Calculation 0 -  Recall 0 -  Language- name 2 objects 2 -  Language- repeat 1 -  Language- follow 3 step command 3 -  Language- read & follow direction 1 -  Write a sentence 1 -  Copy design 1 -  Total  score  20 -     MMSE - Mini Mental State Exam 04/14/2017  Not completed: (No Data)      Movement examination: Tone: There is normal tone in the UE/LE Abnormal movements:  +  L>R tremor.  No myoclonus.  No asterixis.   Coordination:  There is no decremation with RAM's. Normal finger to nose  Gait and Station: gait not tested, patient in wheelchair.   CBC CBC Latest Ref Rng & Units 01/14/2021 09/04/2020 09/01/2020  WBC 4.0 - 10.5 K/uL 10.7(H) 8.4 8.3  Hemoglobin 12.0 - 15.0 g/dL 12.9 12.4 11.3(L)  Hematocrit 36.0 - 46.0 % 38.2 36.9 34.4(L)  Platelets 150.0 - 400.0 K/uL 272.0 249.0 240     CMP Latest Ref Rng & Units 01/14/2021 09/04/2020 09/01/2020  Glucose 70 - 99 mg/dL 177(H) 141(H) 89  BUN 6 - 23 mg/dL 25(H) 29(H) 16  Creatinine 0.40 - 1.20 mg/dL 0.95 0.93 0.86  Sodium 135 - 145 mEq/L 138 137 139  Potassium 3.5 - 5.1 mEq/L 3.8 4.1 3.7  Chloride 96 - 112 mEq/L 100 104 107  CO2 19 - 32 mEq/L _0 Calcium 8.4 - 10.5 mg/dL 9.9 10.0 9.0  Total Protein 6.0 - 8.3 g/dL 7.6 6.7 5.7(L)  Total Bilirubin 0.2 - 1.2 mg/dL 0.3 0.3 0.4  Alkaline Phos 39 - 117 U/L 39 44 37(L)  AST 0 - 37 U/L _1 ALT 0 - 35 U/L _2 Total time spent on today's visit was 30 minutes, including both face-to-face time and nonface-to-face time. Time included that spent on review of records (prior notes available to me/labs/imaging if pertinent), discussing treatment and goals, answering patient's questions and coordinating care.  Cc:  [name], MD Sharene Butters, PA-C

## 2021-02-09 ENCOUNTER — Telehealth (HOSPITAL_BASED_OUTPATIENT_CLINIC_OR_DEPARTMENT_OTHER): Payer: Self-pay | Admitting: Cardiovascular Disease

## 2021-02-09 MED ORDER — HYDRALAZINE HCL 50 MG PO TABS: 50.0000 mg | ORAL_TABLET | Freq: Two times a day (BID) | ORAL | 1 refills | Status: DC

## 2021-02-09 NOTE — Telephone Encounter (Signed)
*  STAT* If patient is at the pharmacy, call can be transferred to refill team.   1. Which medications need to be refilled? (please list name of each medication and dose if known)  hydrALAZINE (APRESOLINE) 50 MG tablet  2. Which pharmacy/location (including street and city if local pharmacy) is medication to be sent to? [facility] [id number] - [place], [address] [place] [place]  3. Do they need a 30 day or 90 day supply? 90 with refills    Patient is out of medication    [name] states the pharmacy made multiple attempts to get refills but was unsuccessful

## 2021-02-09 NOTE — Telephone Encounter (Signed)
Rx(s) sent to pharmacy electronically.  

## 2021-02-13 ENCOUNTER — Other Ambulatory Visit: Payer: Self-pay | Admitting: Family Medicine

## 2021-02-20 NOTE — Addendum Note (Signed)
Encounter addended by: Annie Paras on: 02/20/2021 2:32 PM  Actions taken: Letter saved

## 2021-02-22 ENCOUNTER — Telehealth: Payer: Self-pay | Admitting: Pharmacist

## 2021-02-22 NOTE — Chronic Care Management (AMB) (Addendum)
Chronic Care Management Pharmacy Assistant   Name: Michelle [name]  MRN: [id number] DOB: 1932/11/19  Reason for Encounter: Diabetes Adherence Call   Recent office visits:  01/14/2021 OV (PCP) Michelle Olp, MD; Diabetes has been well controlled. With loose stools we are going to hold metformin for 1 week then restart with metformin extended release 500 mg twice daily down from 1000 mg twice daily of instant release.  01/07/2021 Michelle Alpha, MD; acute visit for chronic cough, recommended Zyrtec nightly and trial of plain Mucinex twice daily, follow up 3-4 weeks with PCP.  Recent consult visits:  02/02/2021 OV (neurology) Michelle Sprang, MD; increase Lamotrigine 25 mg twice daily, keep a seizure calendar, follow up in 6 months.  11/17/2020 OV (gynecology) Michelle Bruins, MD; postmenopausal bleeding, incidental finding o a large left complex ovarian cyst 9.9 x 9.8 x 8.3 cm with septations and one solid nodule along the wall with vascularity in an 87 yo with 2 Mis, COPD, cHTN, Hypercholesterolemia, DM. Refer to Gyneco-Oncology. 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  Hospital visits:  None in previous 6 months  Medications: Outpatient Encounter Medications as of 02/22/2021  Medication Sig Note   Accu-Chek FastClix Lancets MISC Use to test blood sugars daily. Dx: E11.9    ACCU-CHEK SMARTVIEW test strip TEST BLOOD SUGAR THREE TIMES DAILY    acetaminophen (TYLENOL) 650 MG CR tablet Take 1,300 mg by mouth every 8 (eight) hours as needed for pain.    albuterol (PROVENTIL) (2.5 MG/3ML) 0.083% nebulizer solution USE THREE MILLILITERS VIA NEBULIZATION BY MOUTH EVERY 6 HOURS AS NEEDED FOR WHEEZING OR SHORTNESS OF BREATH (Patient taking differently: Take 2.5 mg by nebulization every 6 (six) hours as needed for wheezing or shortness of breath.)    albuterol (VENTOLIN HFA) 108 (90 Base) MCG/ACT inhaler Inhale 1 puff into the lungs every 4 (four) hours as needed for  wheezing or shortness of breath (RESCUE inhaler if advair not working). 08/12/2020: LF at [facility] on 06/10/20. 33 Day Supply   Alcohol Swabs (B-D SINGLE USE SWABS REGULAR) PADS USE  FOR  TESTING THREE TIMES DAILY    amLODipine (NORVASC) 10 MG tablet TAKE 1 TABLET EVERY DAY (Patient taking differently: Take 10 mg by mouth daily.)    aspirin 81 MG tablet Take 81 mg by mouth daily with breakfast.    Blood Glucose Calibration (ACCU-CHEK SMARTVIEW CONTROL) LIQD     blood glucose meter kit and supplies KIT Dispense based on patient and insurance preference. Use up to four times daily as directed. (FOR ICD-9 250.00, 250.01).    calcium carbonate (OSCAL) 1500 (600 Ca) MG TABS tablet Take 600 mg of elemental calcium by mouth daily with breakfast.    carvedilol (COREG) 12.5 MG tablet Take 1 tablet (12.5 mg total) by mouth 2 (two) times daily.    cholecalciferol (VITAMIN D3) 25 MCG (1000 UNIT) tablet Take 1,000 Units by mouth daily with breakfast.    clopidogrel (PLAVIX) 75 MG tablet TAKE 1 TABLET EVERY DAY    diclofenac Sodium (VOLTAREN) 1 % GEL APPLY 2 GRAMS TOPICALLY TO AFFECTED AREA 4 TIMES DAILY    DROPLET PEN NEEDLES 31G X 8 MM MISC USE AS DIRECTED WITH LANTUS SOLOSTAR PEN    enalapril (VASOTEC) 20 MG tablet TAKE 1 TABLET TWICE DAILY (Patient taking differently: Take 20 mg by mouth 2 (two) times daily.)    fenofibrate micronized (LOFIBRA) 134 MG capsule Take 1 capsule (134 mg total) by mouth daily.  ferrous sulfate 325 (65 FE) MG tablet Twice a week (Patient taking differently: Take 325 mg by mouth 2 (two) times a week.)    Fluticasone-Salmeterol (ADVAIR) 250-50 MCG/DOSE AEPB INHALE 1 PUFF TWICE DAILY (Patient taking differently: Inhale 1 puff into the lungs in the morning and at bedtime.)    folic acid (FOLVITE) 211 MCG tablet Take 400 mcg by mouth daily with breakfast.    furosemide (LASIX) 20 MG tablet Take 1 tablet (20 mg total) by mouth daily.    hydrALAZINE (APRESOLINE) 25 MG tablet TAKE ONE  TABLET BY MOUTH EVERY MORNING AS NEEDED FOR HIGH BLOOD PRESSURE    hydrALAZINE (APRESOLINE) 50 MG tablet Take 1 tablet (50 mg total) by mouth in the morning and at bedtime.    icosapent Ethyl (VASCEPA) 1 g capsule Take 2 capsules (2 g total) by mouth 2 (two) times daily.    lamoTRIgine (LAMICTAL) 25 MG tablet Take 1 tablet (25 mg total) by mouth 2 (two) times daily.    Lancets Misc. (ACCU-CHEK FASTCLIX LANCET) KIT 1 Device by Does not apply route. Use as directed for testing blood sugar    Lancets Misc. (ACCU-CHEK FASTCLIX LANCET) KIT Use as directed to test blood sugar    LANTUS SOLOSTAR 100 UNIT/ML Solostar Pen INJECT  17  TO  20 UNITS SUBCUTANEOUSLY EVERY MORNING (Patient taking differently: Inject 18 Units into the skin 2 (two) times daily.)    loratadine (CLARITIN) 10 MG tablet Take 10 mg by mouth every other day.    Melatonin 5 MG CAPS Take 5 mg by mouth at bedtime.    metFORMIN (GLUCOPHAGE-XR) 500 MG 24 hr tablet Take 1 tablet (500 mg total) by mouth in the morning and at bedtime.    nitroGLYCERIN (NITROSTAT) 0.4 MG SL tablet PLACE 1 TABLET (0.4 MG TOTAL) UNDER THE TONGUE EVERY 5 (FIVE) MINUTESAS NEEDED FOR CHEST PAIN (CHEST PAIN OR SHORTNESS OF BREATH). (Patient taking differently: Place 0.4 mg under the tongue every 5 (five) minutes as needed for chest pain (max 3 doses).)    oxyCODONE (OXY IR/ROXICODONE) 5 MG immediate release tablet Take 0.5-1 tablets (2.5-5 mg total) by mouth every 6 (six) hours as needed for severe pain. For AFTER surgery only, do not take and drive (Patient not taking: No sig reported)    pantoprazole (PROTONIX) 40 MG tablet TAKE ONE TABLET BY MOUTH DAILY (Patient taking differently: Take 40 mg by mouth daily with breakfast.)    rosuvastatin (CRESTOR) 40 MG tablet TAKE 1 TABLET EVERY DAY    senna-docusate (SENOKOT-S) 8.6-50 MG tablet Take 2 tablets by mouth at bedtime. For AFTER surgery, do not take if having diarrhea (Patient not taking: No sig reported)    sertraline  (ZOLOFT) 50 MG tablet TAKE 2 TABLETS AT BEDTIME    spironolactone (ALDACTONE) 25 MG tablet Take 1 tablet (25 mg total) by mouth daily. (Patient taking differently: Take 25 mg by mouth daily with breakfast.)    tiotropium (SPIRIVA HANDIHALER) 18 MCG inhalation capsule Place 1 capsule (18 mcg total) into inhaler and inhale daily. (Patient taking differently: Place 18 mcg into inhaler and inhale daily before breakfast.)    vitamin B-12 (CYANOCOBALAMIN) 1000 MCG tablet Take 1,000 mcg by mouth daily with breakfast.    vitamin E 400 UNIT capsule Take 400 Units by mouth at bedtime.    No facility-administered encounter medications on file as of 02/22/2021.    Recent Relevant Labs: Lab Results  Component Value Date/Time   HGBA1C 6.4 01/14/2021 03:15 PM  HGBA1C 6.5 (H) 08/26/2020 04:10 PM   MICROALBUR 0.7 01/07/2014 09:43 AM   MICROALBUR 0.2 07/08/2013 03:12 PM    Kidney Function Lab Results  Component Value Date/Time   CREATININE 0.95 01/14/2021 03:15 PM   CREATININE 0.93 09/04/2020 09:55 AM   CREATININE 0.98 (H) 07/03/2020 09:38 AM   CREATININE 0.81 07/01/2020 09:27 AM   GFR 54.43 (L) 01/14/2021 03:15 PM   GFRNONAA >60 09/01/2020 03:33 AM   GFRNONAA 66 07/01/2020 09:27 AM   GFRAA 77 07/01/2020 09:27 AM   I called and spoke with the patients son Michelle [name]. He stated the patient is doing well since her medication change of Metformin. He states her diarrhea has improved since starting the extended version of Metformin.   Current antihyperglycemic regimen:  Metformin 500 mg XR twice daily Lantus 100u/mL. 17-20 units   What recent interventions/DTPs have been made to improve glycemic control:  Metformin 1000 mg decreased to 500 mg and changed to extended release from instant release.  Have there been any recent hospitalizations or ED visits since last visit with CPP? No  Patient denies hypoglycemic symptoms.  Patient denies hyperglycemic symptoms. -Patient son states the patient does urinate  a lot, although she has other medical related conditions that could cause her to have these symptoms.  How often are you checking your blood sugar? once daily  What are your blood sugars ranging?  Fasting: 110-130 Before meals: none After meals: none Bedtime: none  During the week, how often does your blood glucose drop below 70? Never  Are you checking your feet daily/regularly? Patients son states the patient hasn't been to a podiatrist recently. She checks her feet regularly. He states she doesn't go barefoot and always wears socks or shoes.  Adherence Review: Is the patient currently on a STATIN medication? Yes Is the patient currently on ACE/ARB medication? Yes Does the patient have >5 day gap between last estimated fill dates? No  The patients son scheduled a follow up telephone appointment with the clinical pharmacist for 03/29/2021 at 2:00 pm.  Future Appointments  Date Time Provider [place]  02/24/2021  1:30 PM [facility] 1 [facility] [place]  02/26/2021  3:45 PM [name], MD [facility] None  03/12/2021  4:20 PM Michelle Olp, MD [facility]  03/29/2021  2:00 PM [facility] CCM PHARMACIST [facility]  06/07/2021  2:30 PM [facility] HEALTH COACH [facility]  08/20/2021  3:30 PM Michelle Sprang, MD [facility] None     Star Rating Drugs: Enalapril 20 mg last filled 11/12/2020 90 DS Rosuvastatin last filled 12/17/2020 90 DS Metformin last filled 12/30/2020 90 DS   April D Calhoun, [place] Pharmacist Assistant [phone]   5 minutes spent in review, coordination, and documentation.  Reviewed by: Beverly Milch, PharmD Clinical Pharmacist [phone]

## 2021-02-24 ENCOUNTER — Telehealth: Payer: Self-pay

## 2021-02-24 ENCOUNTER — Other Ambulatory Visit: Payer: Self-pay | Admitting: Family Medicine

## 2021-02-24 ENCOUNTER — Ambulatory Visit (HOSPITAL_COMMUNITY): Admission: RE | Admit: 2021-02-24 | Payer: Medicare HMO | Source: Ambulatory Visit

## 2021-02-24 NOTE — Telephone Encounter (Signed)
Spoke with son [name] regarding phone visit on 8/12. Phone visit is to review ultrasound results. Ultrasound is scheduled for 8/22. Phone visit rescheduled to  Monday 8/29 at 3:45 pm. He verbalizes understanding and is in agreement of date and time. Will call with questions or concerns.

## 2021-02-25 ENCOUNTER — Other Ambulatory Visit: Payer: Self-pay

## 2021-02-25 ENCOUNTER — Telehealth: Payer: Self-pay

## 2021-02-25 ENCOUNTER — Ambulatory Visit: Payer: Medicare HMO | Admitting: Family Medicine

## 2021-02-25 DIAGNOSIS — J189 Pneumonia, unspecified organism: Secondary | ICD-10-CM

## 2021-02-25 DIAGNOSIS — R053 Chronic cough: Secondary | ICD-10-CM

## 2021-02-25 NOTE — Telephone Encounter (Signed)
Called and spoke with pt son and ordered repeat chest xray for pt per the last result note from Dr. Yong Channel.

## 2021-02-25 NOTE — Telephone Encounter (Signed)
Pt's son called regarding Michelle [name]'s X-ray from June. He stated that he received a letter in the mail about the results. The letter stated that something has come up on the results and she needs to call her PCP. He is unsure what it is and what to do.

## 2021-02-26 ENCOUNTER — Inpatient Hospital Stay: Payer: Medicare HMO | Admitting: Gynecologic Oncology

## 2021-03-01 ENCOUNTER — Telehealth: Payer: Self-pay

## 2021-03-01 NOTE — Telephone Encounter (Signed)
FYI

## 2021-03-01 NOTE — Telephone Encounter (Signed)
Received a call from [place] son, [name] who is calling in to let Dr.Hunter know that on Friday Martella had an appointment and when they returned home Neve seemed very weak after going up two steps and had to rest on the 3rd step before able to continue. On Saturday Yesmin complained on very bad pain in her legs and pelvic area. [name] believes this is caused by not using her leg muscles often, was given OTC gel for muscle pain and by Sunday she was better.

## 2021-03-01 NOTE — Telephone Encounter (Signed)
Glad she is feeling better if recurrent symptoms lets have a visit - also looks like we are scheduled 03/12/21 to check in

## 2021-03-05 ENCOUNTER — Other Ambulatory Visit: Payer: Self-pay | Admitting: Family Medicine

## 2021-03-08 ENCOUNTER — Other Ambulatory Visit: Payer: Self-pay

## 2021-03-08 ENCOUNTER — Ambulatory Visit (INDEPENDENT_AMBULATORY_CARE_PROVIDER_SITE_OTHER)
Admission: RE | Admit: 2021-03-08 | Discharge: 2021-03-08 | Disposition: A | Discharge status: Home / Self Care | Payer: Medicare HMO | Source: Ambulatory Visit | Attending: Family Medicine | Admitting: Family Medicine

## 2021-03-08 ENCOUNTER — Ambulatory Visit (HOSPITAL_COMMUNITY)
Admission: RE | Admit: 2021-03-08 | Discharge: 2021-03-08 | Disposition: A | Discharge status: Home / Self Care | Payer: Medicare HMO | Source: Ambulatory Visit | Attending: Gynecologic Oncology | Admitting: Gynecologic Oncology

## 2021-03-08 DIAGNOSIS — R053 Chronic cough: Secondary | ICD-10-CM | POA: Diagnosis not present

## 2021-03-08 DIAGNOSIS — J189 Pneumonia, unspecified organism: Secondary | ICD-10-CM | POA: Diagnosis not present

## 2021-03-08 DIAGNOSIS — N9489 Other specified conditions associated with female genital organs and menstrual cycle: Secondary | ICD-10-CM | POA: Diagnosis not present

## 2021-03-08 DIAGNOSIS — Z8701 Personal history of pneumonia (recurrent): Secondary | ICD-10-CM | POA: Diagnosis not present

## 2021-03-08 DIAGNOSIS — D251 Intramural leiomyoma of uterus: Secondary | ICD-10-CM | POA: Diagnosis not present

## 2021-03-08 DIAGNOSIS — R918 Other nonspecific abnormal finding of lung field: Secondary | ICD-10-CM | POA: Diagnosis not present

## 2021-03-08 DIAGNOSIS — N83202 Unspecified ovarian cyst, left side: Secondary | ICD-10-CM | POA: Diagnosis not present

## 2021-03-08 IMAGING — DX DG CHEST 2V
2 series · 2 of 2 positions shown · non-contrast
Comparison: [DATE]

CLINICAL DATA: 88-year-old female with a history of pneumonia

EXAM:
CHEST - 2 VIEW

[chest pa]
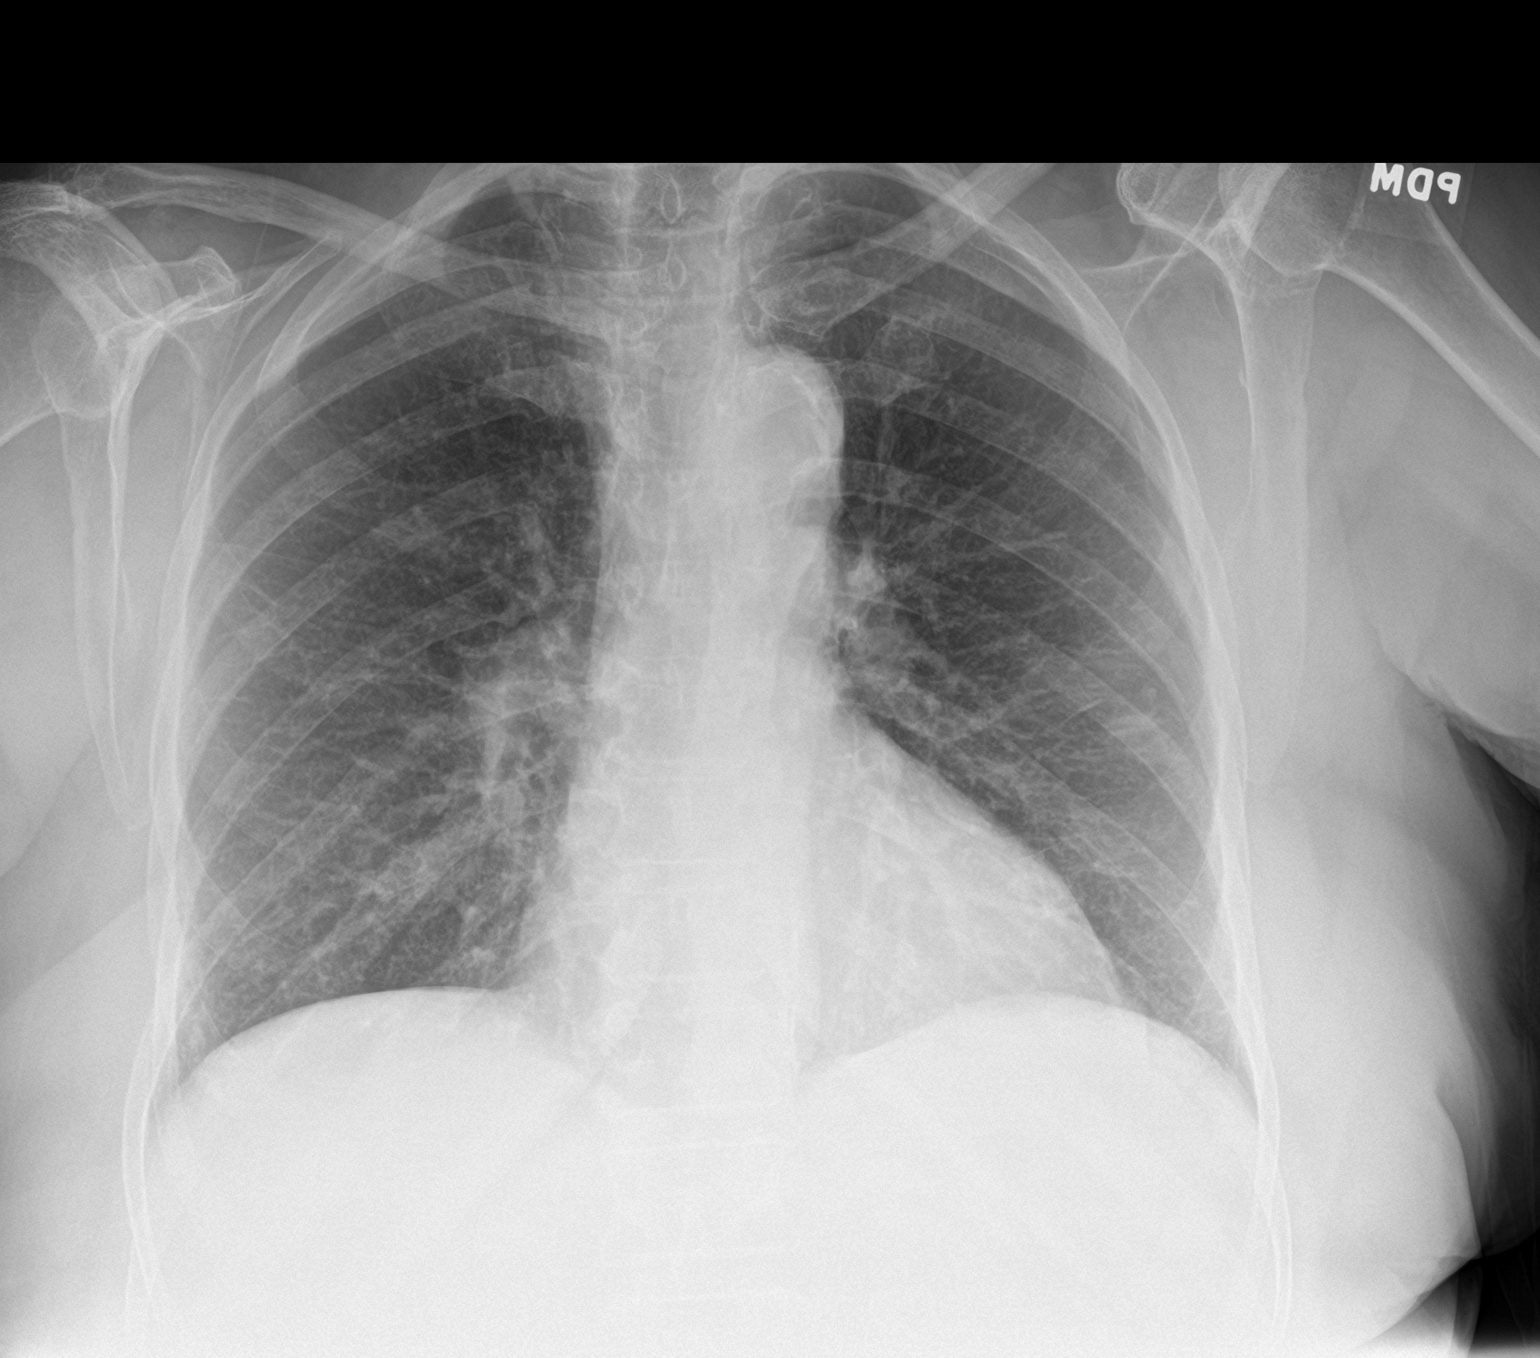

[chest lat]
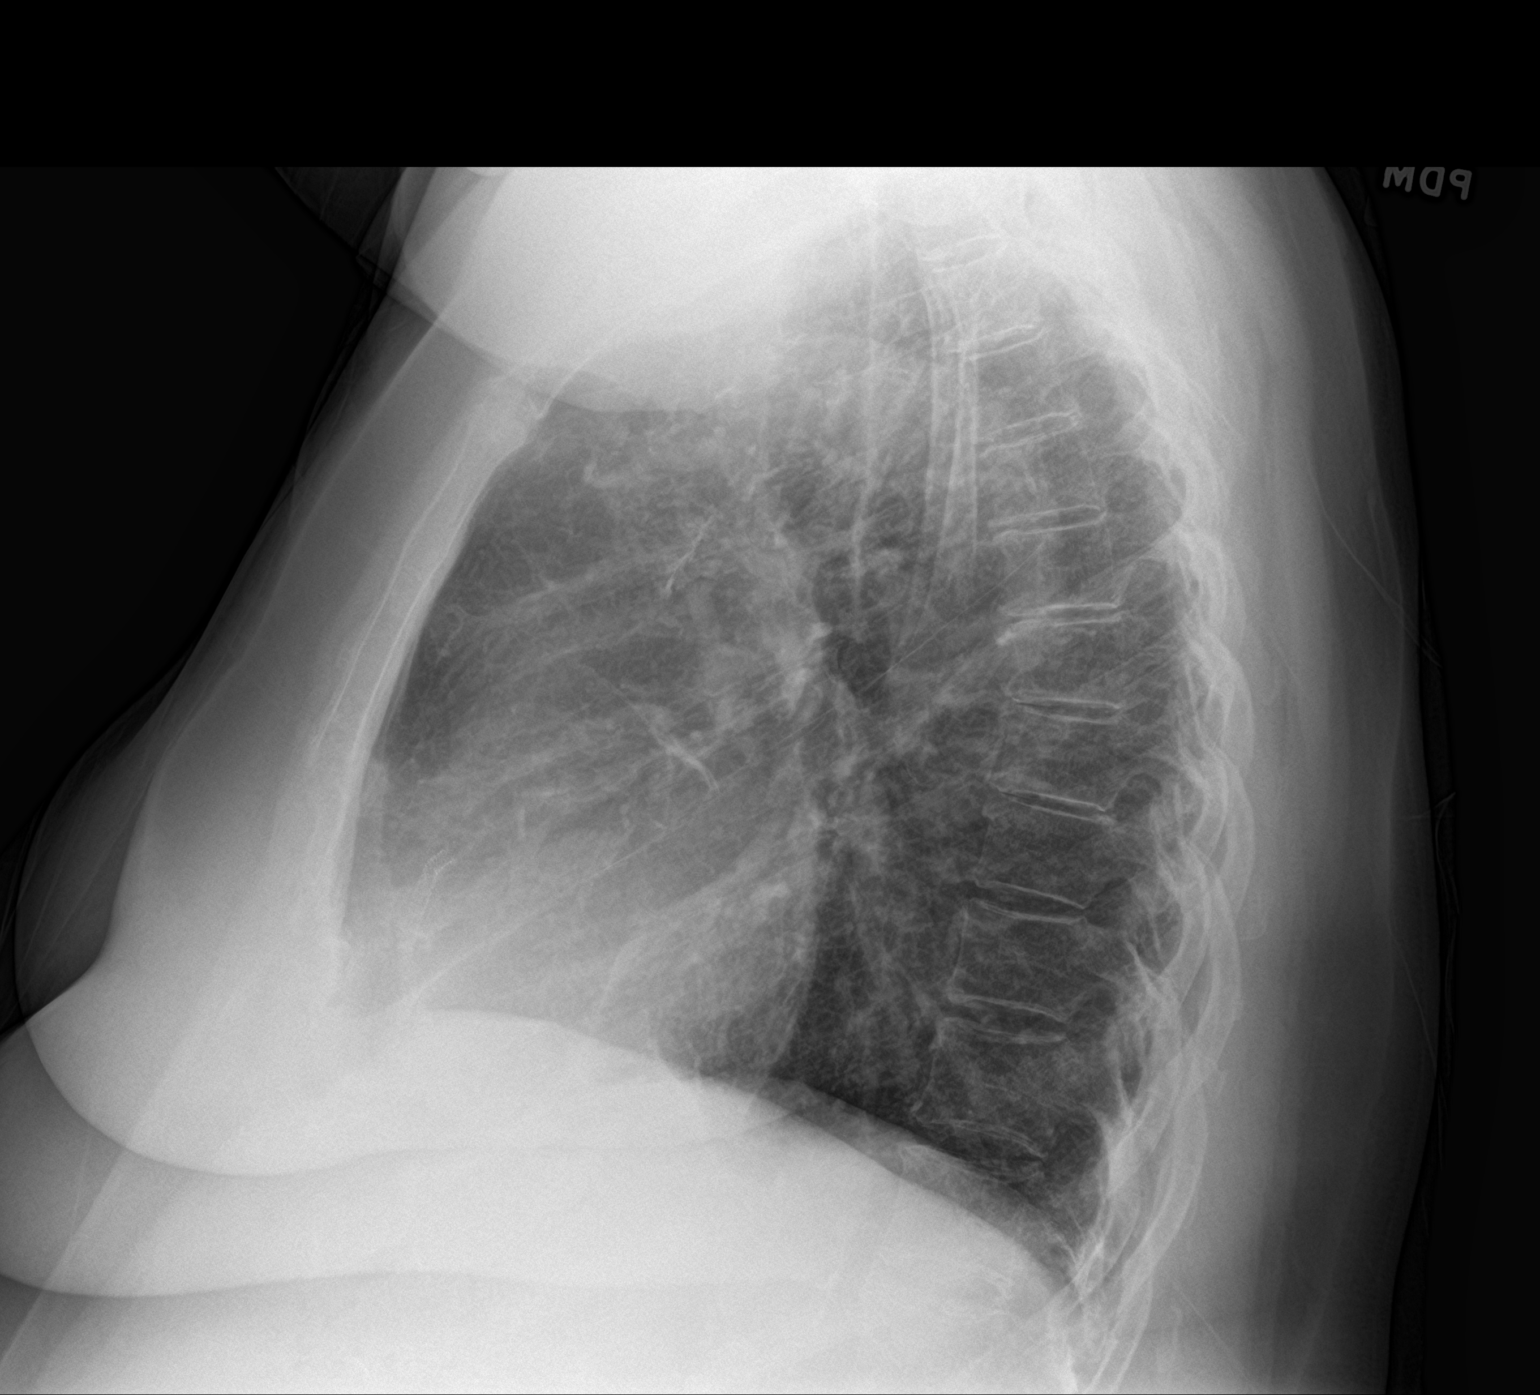

[2 of 2 positions shown; findings below may reference images not displayed]

FINDINGS: Cardiomediastinal silhouette unchanged in size and contour. No
evidence of central vascular congestion. No interlobular septal
thickening.

Similar appearance of coarsened interstitial markings of the
bilateral lungs, with unchanged appearance of more reticulonodular
opacity in the left mid lung. No new confluent airspace disease.

No pneumothorax or pleural effusion.

No acute displaced fracture. Degenerative changes of the spine.
IMPRESSION: Unchanged appearance of the chest x-ray, with persisting
reticulonodular changes in the left mid lung. While this is favored
to represent scarring/chronic change, underlying nodule not
excluded. Further evaluation with noncontrast chest CT recommended.

## 2021-03-08 IMAGING — US US PELVIS COMPLETE WITH TRANSVAGINAL
1 series · 13 of 25 positions shown · non-contrast
Comparison: Ultrasound from [DATE].

CLINICAL DATA: Follow-up examination for left adnexal mass.



[Series 1: us pelvis complete with transvaginal · 58 acquisitions, 13 frames shown]
[im 1/58]
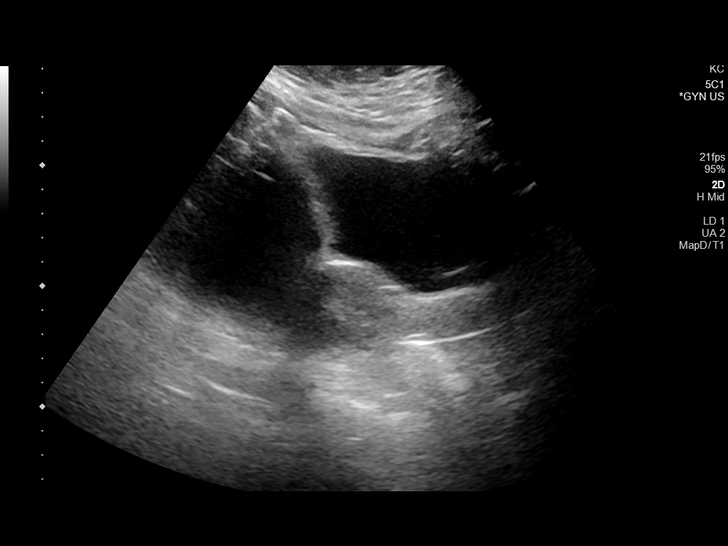
[im 5/58]
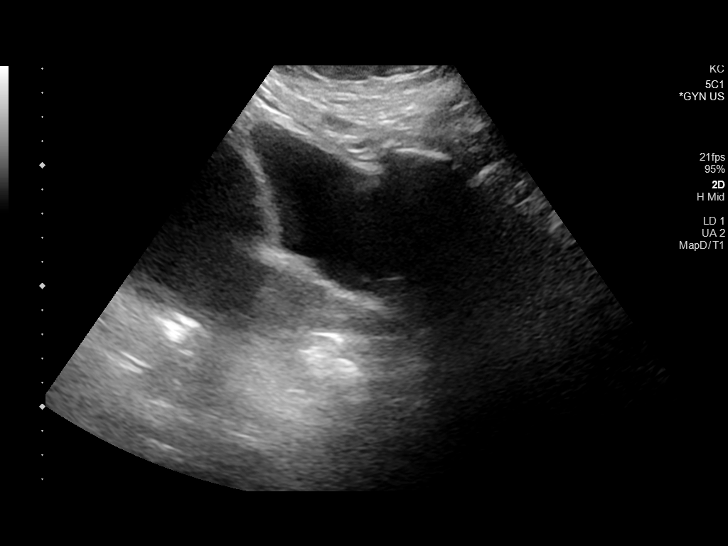
[im 10/58]
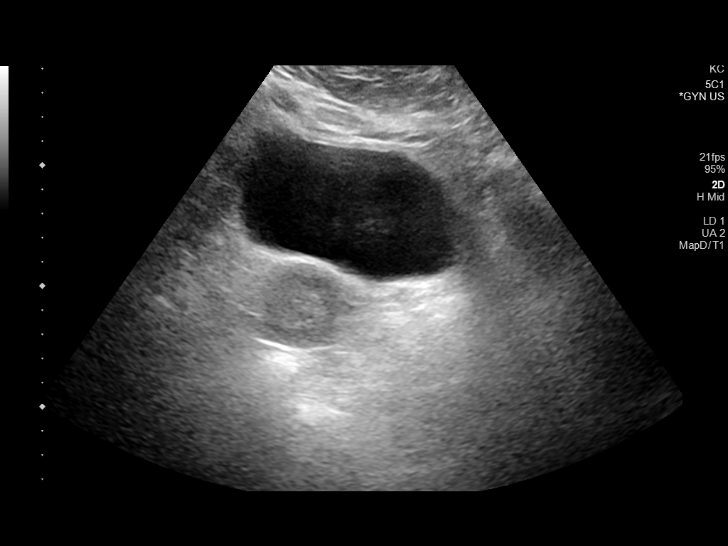
[im 15/58]
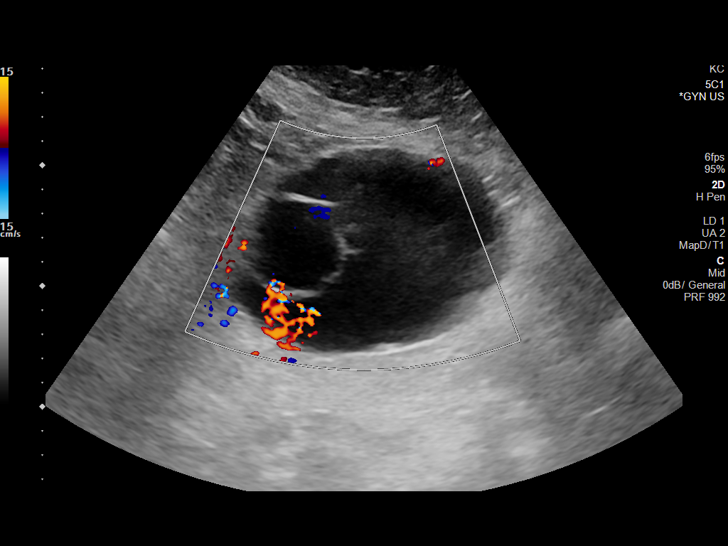
[im 20/58]
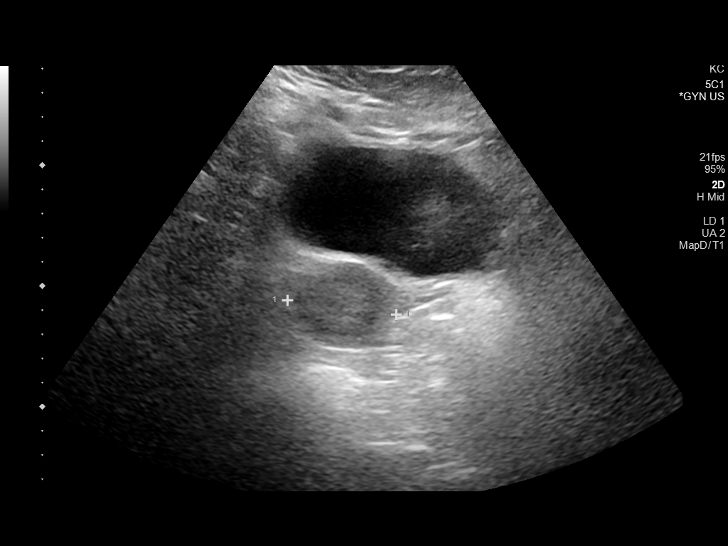
[im 24/58]
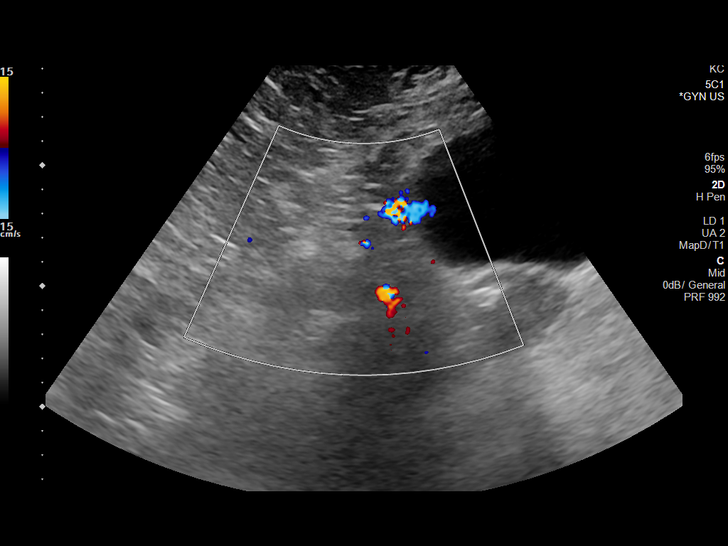
[im 29/58]
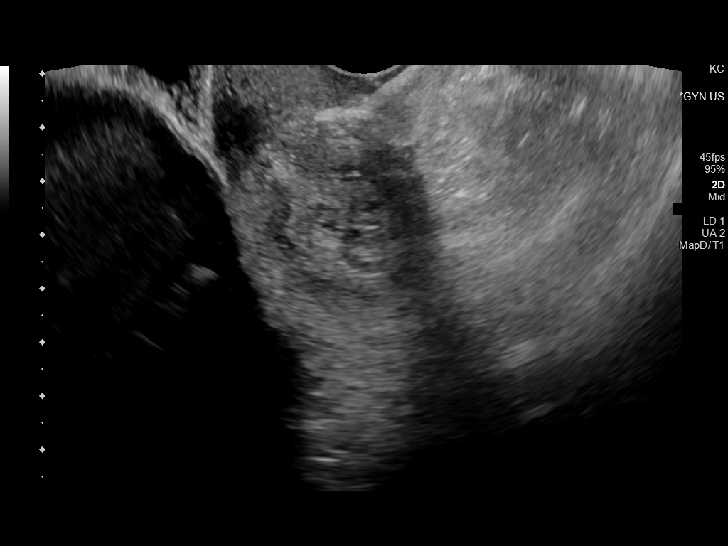
[im 34/58]
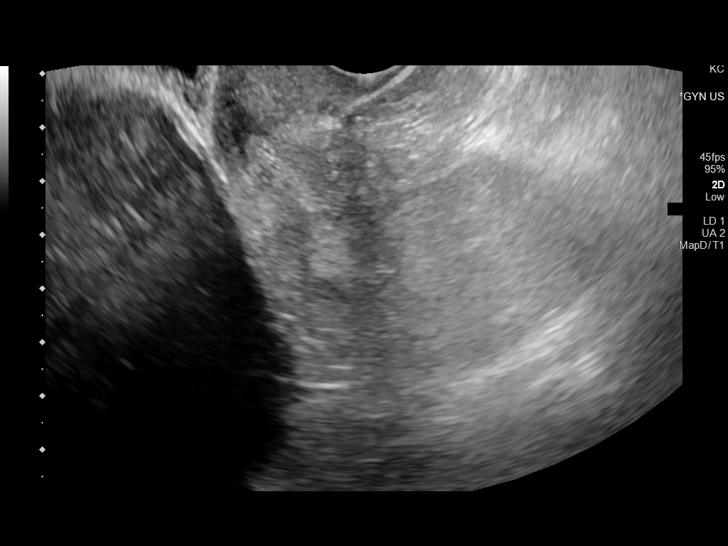
[im 39/58]
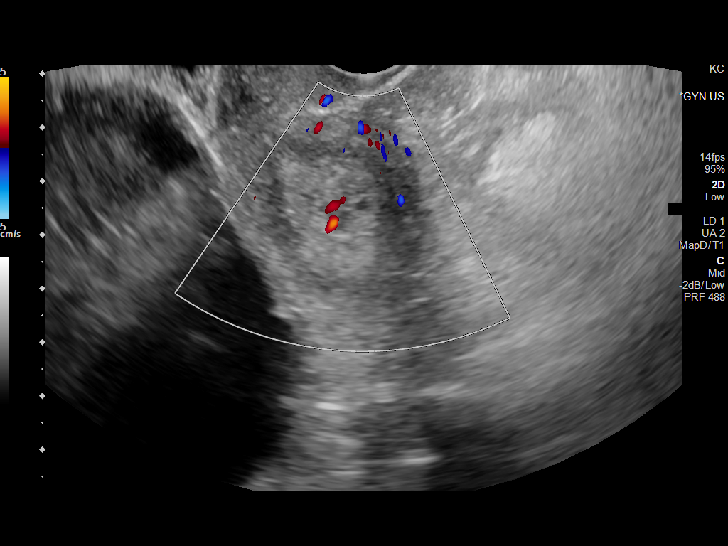
[im 43/58]
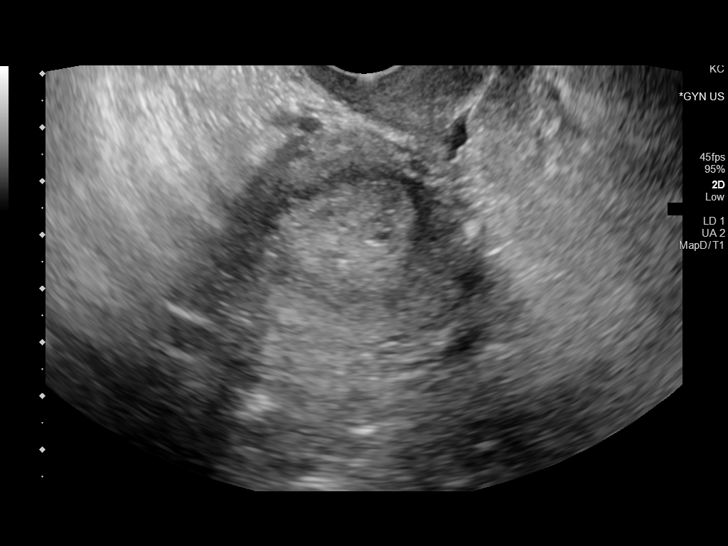
[im 48/58]
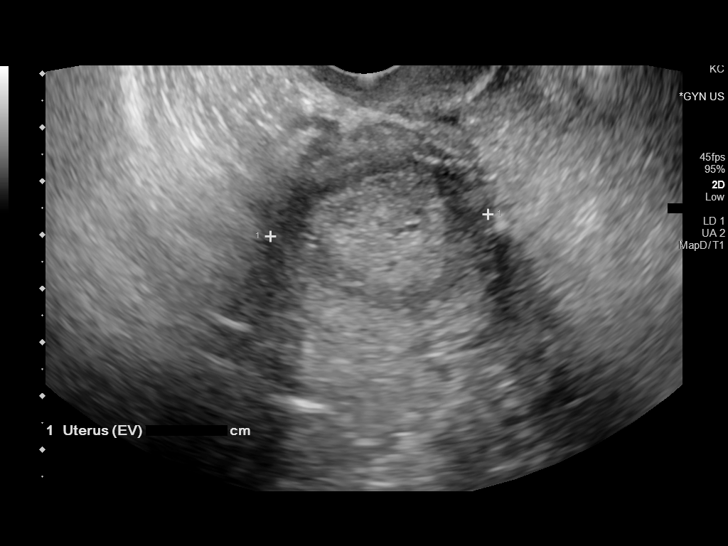
[im 53/58]
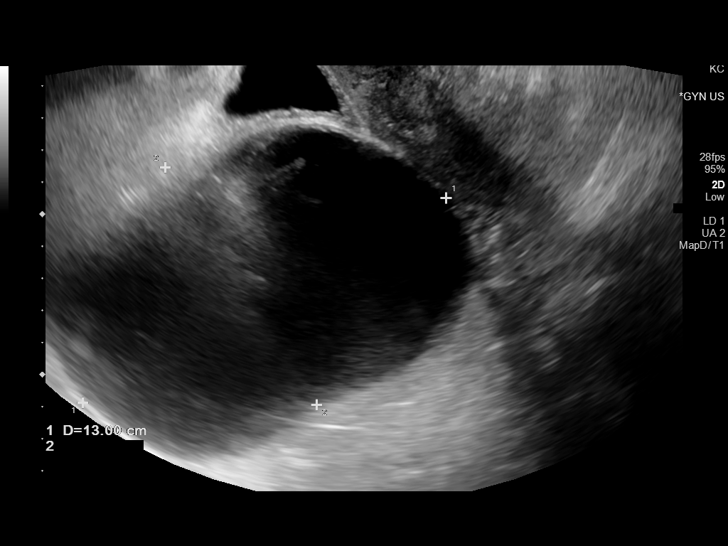
[im 58/58]
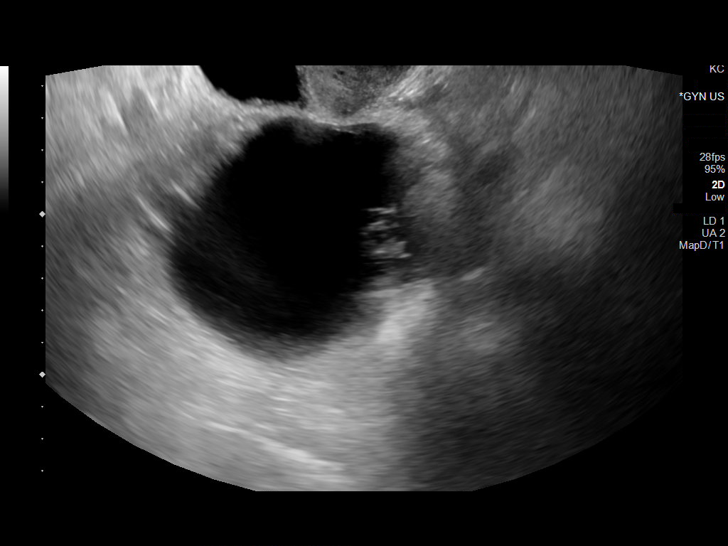

[13 of 25 positions shown; findings below may reference images not displayed]

FINDINGS: Uterus

Measurements: 8.2 x 3.7 x 4.5 cm = volume: 72.1 mL. Uterus is
anteverted. 1.2 cm calcified intramural fibroid noted.

Endometrium

Thickness: 16.9 mm. Endometrial complex demonstrates a heterogeneous
echotexture with internal microcystic change. Associated vascularity
and/or feeder vessel is seen (image 40).

Right ovary

Not visualized.  No adnexal mass.

Left ovary

The native left ovary is not visualized. Large complex cystic mass
seen within the left adnexa measures 10.7 x 7.9 x 11.2 cm. Lesion
demonstrates a few irregular internal septations. Probable
associated solid mural nodularity (image 50). No visible associated
vascularity evident on this exam.

Other findings

No abnormal free fluid.
IMPRESSION: 1. 10.7 x 7.9 x 11.2 cm complex cystic left adnexal mass with mural
nodularity, concerning for a primary ovarian neoplasm. No free fluid
within the pelvis. The native ovaries are not visualized.
Gynecologic referral for further workup and surgical consultation
recommended if not already obtained. Additionally, further
assessment with dedicated MRI of the pelvis, with and without
contrast, could be performed for further characterization as
warranted.
2. Thickened endometrial complex measuring up to 16.9 mm with
internal microcystic change and vascularity. Endometrial sampling
should be considered to exclude carcinoma.
3. 1.2 cm calcified intramural fibroid.

## 2021-03-09 ENCOUNTER — Other Ambulatory Visit: Payer: Self-pay

## 2021-03-09 DIAGNOSIS — J189 Pneumonia, unspecified organism: Secondary | ICD-10-CM

## 2021-03-10 DIAGNOSIS — I5032 Chronic diastolic (congestive) heart failure: Secondary | ICD-10-CM | POA: Diagnosis not present

## 2021-03-10 DIAGNOSIS — Z515 Encounter for palliative care: Secondary | ICD-10-CM | POA: Diagnosis not present

## 2021-03-10 NOTE — Progress Notes (Signed)
Phone [phone] In person visit   Subjective:   Michelle [name] is a 87 y.o. year old very pleasant female patient who presents for/with See problem oriented charting Chief Complaint  Patient presents with   Follow-up   Results    Xray    This visit occurred during the SARS-CoV-2 public health emergency.  Safety protocols were in place, including screening questions prior to the visit, additional usage of staff PPE, and extensive cleaning of exam room while observing appropriate contact time as indicated for disinfecting solutions.   Past Medical History-  Patient Active Problem List   Diagnosis Date Noted   Memory loss 10/22/2020    Priority: High   Delirium 06/25/2020    Priority: High   Chronic diastolic CHF (congestive heart failure) ([place]) 03/13/2014    Priority: High   CAD- RCA PCI '90s, STEMI-RCA DES 02/18/14 03/05/2014    Priority: High   Type 2 diabetes mellitus without complication, with long-term current use of insulin ([place]) 03/26/2007    Priority: High   Anemia, iron deficiency 10/23/2015    Priority: Medium   CKD (chronic kidney disease), stage III ([place]) 07/25/2014    Priority: Medium   Osteoarthritis, knee 05/13/2014    Priority: Medium   Anxiety state 04/21/2014    Priority: Medium   SOB (shortness of breath) 03/05/2014    Priority: Medium   Hyperlipidemia associated with type 2 diabetes mellitus ([place]) 03/26/2007    Priority: Medium   Essential hypertension 04/21/2006    Priority: Medium   COPD (chronic obstructive pulmonary disease) ([place]) 04/21/2006    Priority: Medium   Actinic keratosis 11/17/2017    Priority: Low   Back pain, lumbosacral 10/10/2012    Priority: Low   Adnexal mass 11/24/2020   Thickened endometrium 11/24/2020    Medications- reviewed and updated Current Outpatient Medications  Medication Sig Dispense Refill   Accu-Chek FastClix Lancets MISC Use to test blood sugars daily. Dx: E11.9 100 each 2   ACCU-CHEK SMARTVIEW test  strip TEST BLOOD SUGAR THREE TIMES DAILY 300 strip 1   acetaminophen (TYLENOL) 650 MG CR tablet Take 1,300 mg by mouth every 8 (eight) hours as needed for pain.     albuterol (PROVENTIL) (2.5 MG/3ML) 0.083% nebulizer solution USE THREE MILLILITERS VIA NEBULIZATION BY MOUTH EVERY 6 HOURS AS NEEDED FOR WHEEZING OR SHORTNESS OF BREATH (Patient taking differently: Take 2.5 mg by nebulization every 6 (six) hours as needed for wheezing or shortness of breath.) 75 mL 3   albuterol (VENTOLIN HFA) 108 (90 Base) MCG/ACT inhaler INHALE 1 PUFF EVERY 4 HOURS AS NEEDED FOR WHEEZING, SHORTNESS OF BREATH (RESCUE INHALER IF ADVAIR NOT WORKING) 2 each 2   Alcohol Swabs (B-D SINGLE USE SWABS REGULAR) PADS USE  FOR  TESTING THREE TIMES DAILY 300 each 1   amLODipine (NORVASC) 10 MG tablet TAKE 1 TABLET EVERY DAY (Patient taking differently: Take 10 mg by mouth daily.) 90 tablet 3   aspirin 81 MG tablet Take 81 mg by mouth daily with breakfast.     Blood Glucose Calibration (ACCU-CHEK SMARTVIEW CONTROL) LIQD      blood glucose meter kit and supplies KIT Dispense based on patient and insurance preference. Use up to four times daily as directed. (FOR ICD-9 250.00, 250.01). 1 each 0   calcium carbonate (OSCAL) 1500 (600 Ca) MG TABS tablet Take 600 mg of elemental calcium by mouth daily with breakfast.     cholecalciferol (VITAMIN D3) 25 MCG (1000 UNIT) tablet Take 1,000  Units by mouth daily with breakfast.     clopidogrel (PLAVIX) 75 MG tablet TAKE 1 TABLET EVERY DAY 90 tablet 1   diclofenac Sodium (VOLTAREN) 1 % GEL APPLY 2 GRAMS TOPICALLY TO AFFECTED AREA 4 TIMES DAILY 300 g 3   DROPLET PEN NEEDLES 31G X 8 MM MISC USE AS DIRECTED WITH LANTUS SOLOSTAR PEN 100 each 4   enalapril (VASOTEC) 20 MG tablet TAKE 1 TABLET TWICE DAILY (Patient taking differently: Take 20 mg by mouth 2 (two) times daily.) 180 tablet 3   fenofibrate micronized (LOFIBRA) 134 MG capsule Take 1 capsule (134 mg total) by mouth daily. 90 capsule 3    ferrous sulfate 325 (65 FE) MG tablet Twice a week (Patient taking differently: Take 325 mg by mouth 2 (two) times a week.) 18 tablet 3   Fluticasone-Salmeterol (ADVAIR) 250-50 MCG/DOSE AEPB INHALE 1 PUFF TWICE DAILY (Patient taking differently: Inhale 1 puff into the lungs in the morning and at bedtime.) 520 each 3   folic acid (FOLVITE) 802 MCG tablet Take 400 mcg by mouth daily with breakfast.     hydrALAZINE (APRESOLINE) 25 MG tablet TAKE ONE TABLET BY MOUTH EVERY MORNING AS NEEDED FOR HIGH BLOOD PRESSURE 90 tablet 1   hydrALAZINE (APRESOLINE) 50 MG tablet Take 1 tablet (50 mg total) by mouth in the morning and at bedtime. 180 tablet 1   icosapent Ethyl (VASCEPA) 1 g capsule Take 2 capsules (2 g total) by mouth 2 (two) times daily. 360 capsule 3   lamoTRIgine (LAMICTAL) 25 MG tablet Take 1 tablet (25 mg total) by mouth 2 (two) times daily. 180 tablet 3   Lancets Misc. (ACCU-CHEK FASTCLIX LANCET) KIT 1 Device by Does not apply route. Use as directed for testing blood sugar     Lancets Misc. (ACCU-CHEK FASTCLIX LANCET) KIT Use as directed to test blood sugar 1 kit 1   LANTUS SOLOSTAR 100 UNIT/ML Solostar Pen INJECT  17  TO  20 UNITS SUBCUTANEOUSLY EVERY MORNING (Patient taking differently: Inject 18 Units into the skin 2 (two) times daily.) 30 mL 5   loratadine (CLARITIN) 10 MG tablet Take 10 mg by mouth every other day.     Melatonin 5 MG CAPS Take 5 mg by mouth at bedtime.     metFORMIN (GLUCOPHAGE-XR) 500 MG 24 hr tablet Take 1 tablet (500 mg total) by mouth in the morning and at bedtime. 180 tablet 3   nitroGLYCERIN (NITROSTAT) 0.4 MG SL tablet PLACE 1 TABLET (0.4 MG TOTAL) UNDER THE TONGUE EVERY 5 (FIVE) MINUTESAS NEEDED FOR CHEST PAIN (CHEST PAIN OR SHORTNESS OF BREATH). (Patient taking differently: Place 0.4 mg under the tongue every 5 (five) minutes as needed for chest pain (max 3 doses).) 25 tablet 6   oxyCODONE (OXY IR/ROXICODONE) 5 MG immediate release tablet Take 0.5-1 tablets (2.5-5 mg  total) by mouth every 6 (six) hours as needed for severe pain. For AFTER surgery only, do not take and drive 10 tablet 0   pantoprazole (PROTONIX) 40 MG tablet TAKE 1 TABLET EVERY DAY 90 tablet 1   rosuvastatin (CRESTOR) 40 MG tablet TAKE 1 TABLET EVERY DAY 90 tablet 0   senna-docusate (SENOKOT-S) 8.6-50 MG tablet Take 2 tablets by mouth at bedtime. For AFTER surgery, do not take if having diarrhea 30 tablet 0   sertraline (ZOLOFT) 50 MG tablet TAKE 2 TABLETS AT BEDTIME 60 tablet 0   spironolactone (ALDACTONE) 25 MG tablet Take 1 tablet (25 mg total) by mouth daily. (Patient taking differently:  Take 25 mg by mouth daily with breakfast.) 90 tablet 3   tiotropium (SPIRIVA HANDIHALER) 18 MCG inhalation capsule Place 1 capsule (18 mcg total) into inhaler and inhale daily. (Patient taking differently: Place 18 mcg into inhaler and inhale daily before breakfast.) 90 capsule 2   vitamin B-12 (CYANOCOBALAMIN) 1000 MCG tablet Take 1,000 mcg by mouth daily with breakfast.     vitamin E 400 UNIT capsule Take 400 Units by mouth at bedtime.     carvedilol (COREG) 12.5 MG tablet Take 1 tablet (12.5 mg total) by mouth 2 (two) times daily. 60 tablet 3   furosemide (LASIX) 20 MG tablet Take 1 tablet (20 mg total) by mouth daily. 90 tablet 2   No current facility-administered medications for this visit.     Objective:  BP 138/68   Pulse 78   Temp 98.4 F (36.9 C) (Temporal)   Ht '5\' 5"'  (1.651 m)   Wt 192 lb (87.1 kg)   SpO2 96%   BMI 31.95 kg/m  Gen: NAD, resting comfortably CV: RRR no murmurs rubs or gallops Lungs: CTAB no crackles, wheeze, rhonchi Abdomen: soft/nontender/nondistended/normal bowel sounds.  Ext: no edema Skin: warm, dry     Assessment and Plan   #General health update-patient continues to follow with hospice of [facility]  #Left frontal lobe epilepsy S: Patient determined to have left frontal lobe epilepsy and started on Lamictal by Dr. Delice Lesch A/P: Thankfully patient has  had no further episodes on medication-continue Lamictal   #Memory loss S: Overall symptoms of been stable recently.  Less sundowning.  Having short-term memory issues. A/P: Dr. Delice Lesch describes this is likely vascular dementia.  MMSE most recently checked 20 out of 30 with MoCA 13 out of 30.  Healthy lifestyle changes encouraged but no regular medication  #Cough/pulmonary opacity S: Cough did not improve on antibiotics.  Follow-up chest x-ray did not show resolution of opacity A/P: CT scan of the lungs has been ordered-patient likely would not want to have biopsy or surgery but we are pursuing this for more information/complete picture especially in light of possible ovarian malignancy.  We will monitor cough-we are primarily investigating this to make sure it was not a cause of her memory issues/potential delirium but it sounds like there is baseline dementia along with prior seizure activity improved on medication   # Diabetes S: Medication:: Lantus 18 units plus metformin 500 mg twice daily but diarrhea for several months- changed to extended release- this resolved.  -A1c updated- 01/14/2021- 6.4- changed metformin 1000 mg to 500 mg to see if would help with diarrhea Cbg-s up just slightly at home Lab Results  Component Value Date   HGBA1C 6.4 01/14/2021   HGBA1C 6.5 (H) 08/26/2020   HGBA1C 6.6 (H) 06/25/2020   A/P: diabetes has been well controlled. On lower dose of metformin now but thankfully no diarrhea- extended release likely helps as well- for now continue current meds  # B12 deficiency Current treatment/medication (oral vs. IM): patient maintains oral B-12   # Resistant Hypertension S: medication: Amlodipine 10 mg daily, lasix 20 mg daily, hydralazine 25 mg Am with 50 mg every 8 hours after, spironolactone 25 mg daily, carvedilol 12.5 mg BIDand enalapril 20 mg twice daily. BP Readings from Last 3 Encounters:  03/12/21 138/68  02/02/21 133/85  01/14/21 134/68  A/P: Stable.  Continue current medications.   #hyperlipidemia #CAD S: Medication: Plavix 75 mg daily, Aspirin 81 mg daily and Rosuvastatin 40 mg daily No chest pain. stable  shortness of breath Lab Results  Component Value Date   CHOL 128 02/18/2020   HDL 49 (L) 02/18/2020   LDLCALC 42 02/18/2020   LDLDIRECT 40.0 07/23/2019   TRIG 355 (H) 02/18/2020   CHOLHDL 2.6 02/18/2020   A/P: Cad stable- lipids we need to update but will wait until next visit. For now continue current meds  # Chronic diastolic CHF S: continued to despite being on amlodipine- this may be counterbalanced by spironolactone 25 mg daily and Lasix 20 mg daily in regards to today-no signs of fluid overload- stable to edema -weight was down from last visit a few lbs- weight also up 7 lbs from last visit. Selling is up some as well.  A/P: Appears to have some fluid overload-we are going to have her take Lasix twice daily for the next 3 days to see if this helps with swelling in her legs as well as weight gain. - I am also concerned with her sedentary state that she could have a clot in the left lower leg-this could simply be asymmetric swelling but she has some more prominent varicose veins on that leg.  No calf tenderness.  We discussed possibly going to the emergency room but opted in the end to try to get this done next week when both sons are available to help with her on Wednesday or later-team will call to try and set this up next week.  If she has worsening symptoms such as calf pain or worsening leg swelling particular on the left agrees to go to the emergency room over the weekend.  #Left leg swelling- felt weak going up stairs on 02/03/21- also had pain in pelvic area and into legs. Since that time has noted some sweling in both lower legs- worse on the left- no calf pain. Weight is up some. Some left groin pain at times- worse with hip movement.  -Varicose veins appear more prominent on the left leg and edema is certainly more  prominent.  No calf pain.  We discussed in light of possible ovarian malignancy this would further increase the risk of DVT-her visit was around 5 PM on Friday and we were unable to set up a stat scan-discussed emergency room but patient declines that option-she agrees to seek care immediately if new or worsening symptoms.  Also we will try to schedule for scan for next week-unfortunately this will have to wait until Wednesday  # Postmenopausal bleeding - Of note,patient also followed up with Elijio Miles for large cystic mass in left adnexa 9.9 x 9.8 x 8.3 cm and had surgery planned on June 9 which was not completed-patient was fearful she would not make it out of surgery.  On endometrial biopsy was negative for cancer but family is aware this does not address the adnexal mass.  Regardless they.Canceled surgery.  She had no further bleeding or abdominal pain.  Plan currently is for 72-monthultrasound as she wanted to start with this instead of MRI -had ultrasound and will discuss with Dr. TBerline Lopes- I did briefly discuss scan with patient only in light of possible DVT -family and patient seem to be leaning toward not treating this/investigating further and leaning on quality of life/palliative care discussions/hospice later if needed     Recommended follow up: No follow-ups on file. Future Appointments  Date Time Provider [place] 03/15/2021  3:45 PM TLafonda Mosses MD [facility] None  03/29/2021  2:00 PM [facility] CCM PHARMACIST [facility]  06/07/2021  2:30  PM [facility] HEALTH COACH [facility]  08/20/2021  3:30 PM [name], MD [facility] None    Lab/Order associations:   ICD-10-CM   1. Chronic diastolic CHF (congestive heart failure) (HCC)  I50.32     2. Hyperlipidemia associated with type 2 diabetes mellitus ([place])  E11.69    E78.5     3. Coronary artery disease involving native coronary artery of native heart without angina pectoris  I25.10     4. Type 2 diabetes mellitus  without complication, with long-term current use of insulin (H[facility])  E11.9    Z79.4     5. Essential hypertension  I10     6. B12 deficiency  E53.8       No orders of the defined types were placed in this encounter.  I,Jada Bradford,acting as a scribe for [name], MD.,have documented all relevant documentation on the behalf of [name], MD,as directed by  [name], MD while in the presence of [name], MD.  I, [name], MD, have reviewed all documentation for this visit. The documentation on 03/12/21 for the exam, diagnosis, procedures, and orders are all accurate and complete.  Time Spent: 40 minutes of total time (4:50 PM- 5:30 PM) was spent on the date of the encounter performing the following actions: chart review prior to seeing the patient, obtaining history, performing a medically necessary exam, counseling on the treatment plan as well as about potentially not proceeding forward with a surgery, placing orders, and documenting in our EHR, and creating AVS.   Return precautions advised.  [name], MD

## 2021-03-12 ENCOUNTER — Ambulatory Visit (INDEPENDENT_AMBULATORY_CARE_PROVIDER_SITE_OTHER): Payer: Medicare HMO | Admitting: Family Medicine

## 2021-03-12 ENCOUNTER — Other Ambulatory Visit: Payer: Self-pay

## 2021-03-12 ENCOUNTER — Encounter: Payer: Self-pay | Admitting: Family Medicine

## 2021-03-12 VITALS — BP 138/68 | HR 78 | Temp 98.4°F | Ht 65.0 in | Wt 192.0 lb

## 2021-03-12 DIAGNOSIS — I5032 Chronic diastolic (congestive) heart failure: Secondary | ICD-10-CM

## 2021-03-12 DIAGNOSIS — I10 Essential (primary) hypertension: Secondary | ICD-10-CM

## 2021-03-12 DIAGNOSIS — Z794 Long term (current) use of insulin: Secondary | ICD-10-CM

## 2021-03-12 DIAGNOSIS — R600 Localized edema: Secondary | ICD-10-CM | POA: Diagnosis not present

## 2021-03-12 DIAGNOSIS — E538 Deficiency of other specified B group vitamins: Secondary | ICD-10-CM | POA: Diagnosis not present

## 2021-03-12 DIAGNOSIS — E119 Type 2 diabetes mellitus without complications: Secondary | ICD-10-CM

## 2021-03-12 DIAGNOSIS — E785 Hyperlipidemia, unspecified: Secondary | ICD-10-CM | POA: Diagnosis not present

## 2021-03-12 DIAGNOSIS — I2510 Atherosclerotic heart disease of native coronary artery without angina pectoris: Secondary | ICD-10-CM | POA: Diagnosis not present

## 2021-03-12 DIAGNOSIS — E1169 Type 2 diabetes mellitus with other specified complication: Secondary | ICD-10-CM

## 2021-03-12 DIAGNOSIS — G40909 Epilepsy, unspecified, not intractable, without status epilepticus: Secondary | ICD-10-CM

## 2021-03-12 NOTE — Patient Instructions (Addendum)
Health Maintenance Due  Topic Date Due   COVID-19 Vaccine (4 - Booster for Moderna series)  - Please consider new Variant-Specific COVID-19 vaccination in the Fall.  01/03/2021   INFLUENZA VACCINE   - Please consider getting your flu shot in the Fall. If you get this outside of our office, please let us know.  02/15/2021   Monitor left leg at home. If having worsening symptoms such as  swelling or calf pain, go to the hospital.  Adjust taking Lasix 20 mg twice daily over the weekend to help with swelling in legs. Update me on Monday  Try to get ultrasound mid to late next week to accommodate schedules  Recommended follow up: Return in about 3 months (around 06/12/2021) for follow up- or sooner if needed.

## 2021-03-15 ENCOUNTER — Telehealth: Payer: Self-pay

## 2021-03-15 ENCOUNTER — Inpatient Hospital Stay: Payer: Medicare HMO | Admitting: Gynecologic Oncology

## 2021-03-15 ENCOUNTER — Telehealth: Payer: Self-pay | Admitting: *Deleted

## 2021-03-15 NOTE — Telephone Encounter (Signed)
[name] called and said Janisa took her normal morning nap and when she woke up she didn't know who [name] was and very confused, asking to talk with her mom who passed away several years ago.

## 2021-03-15 NOTE — Telephone Encounter (Signed)
Called and canceled appt for today due to Dr Berline Lopes being sick. Explained that the office will call back to reschedule

## 2021-03-15 NOTE — Telephone Encounter (Signed)
In situations like this we likely need to triage- please give me a status update tomorrow- if persistent symptoms please have her triaged

## 2021-03-15 NOTE — Telephone Encounter (Signed)
Michelle [name] can you f/u with pt regarding [place].

## 2021-03-15 NOTE — Telephone Encounter (Signed)
[name] called in stating that they came in last week because Michelle [name] was having groin pain and L leg swelling, after the increase in medication there has been no swelling or pain. Also wondering if there is an update about the [place] as they havent heard back, wanted [name] to know if she schedules an appointment it has to be a Wed. Or Thurs.

## 2021-03-15 NOTE — Telephone Encounter (Signed)
See below

## 2021-03-16 NOTE — Telephone Encounter (Addendum)
Received a call from [place], outcome was to call 911. [name] kept telling them that Lexa was refusing to call 911, they advised that [name] call but he declined saying Vivi doesn't want to go. Please advise.

## 2021-03-16 NOTE — Telephone Encounter (Signed)
Nurse Assessment Nurse: Rolin Barry, RN, Levada Dy Date/Time Eilene Ghazi Time): 03/16/2021 9:07:22 AM Confirm and document reason for call. If symptomatic, describe symptoms. ---Caller states having some new confusion and not sleeping well; didn't know who people are or where she is. Son advised that she has had sx of this before, having confusion now. Does the patient have any new or worsening symptoms? ---Yes Will a triage be completed? ---Yes Related visit to physician within the last 2 weeks? ---No Does the PT have any chronic conditions? (i.e. diabetes, asthma, this includes High risk factors for pregnancy, etc.) ---Yes List chronic conditions. ---dementia dx in the working HTN Diabetes Is this a behavioral health or substance abuse call? ---No Guidelines Guideline Title Affirmed Question Affirmed Notes Nurse Date/Time (Eastern Time) Confusion - Delirium [1] Difficult to awaken or acting confused (e.g., disoriented, slurred speech) AND [2] Deaton, RN, Levada Dy 03/16/2021 9:09:39 AM PLEASE NOTE: All timestamps contained within this report are represented as [place] Standard Time. CONFIDENTIALTY NOTICE: This fax transmission is intended only for the addressee. It contains information that is legally privileged, confidential or otherwise protected from use or disclosure. If you are not the intended recipient, you are strictly prohibited from reviewing, disclosing, copying using or disseminating any of this information or taking any action in reliance on or regarding this information. If you have received this fax in error, please notify us immediately by telephone so that we can arrange for its return to [place]. Phone: [phone], Toll-Free: [phone], Fax: [phone] Page: 2 of 2 Call Id: [id number] Guidelines Guideline Title Affirmed Question Affirmed Notes Nurse Date/Time Eilene Ghazi Time) present now AND [3] new-onset Disp. Time Eilene Ghazi Time) Disposition Final User 03/16/2021 9:05:42 AM  Send to Urgent Mikael Spray 03/16/2021 9:20:45 AM 911 Outcome Documentation Deaton, RN, Levada Dy Reason: Osvaldo Human is refusing. 03/16/2021 9:15:04 AM Call EMS 911 Now Yes Deaton, RN, Cindee Lame Disagree/Comply Comply Caller Understands Yes PreDisposition Did not know what to do Care Advice Given Per Guideline CALL EMS 911 NOW: * Immediate medical attention is needed. You need to hang up and call 911 (or an ambulance). * Triager Discretion: I'll call you back in a few minutes to be sure you were able to reach them. CARE ADVICE given per Confusion-Delirium (Adult) guideline. Comments User: Jerrye Beavers Date/Time Eilene Ghazi Time): 03/16/2021 9:04:11 AM Info provided by Darrel Hoover; User: Saverio Danker, RN Date/Time Eilene Ghazi Time): 03/16/2021 9:11:26 AM Son advised that sx started yesterday. User: Saverio Danker, RN Date/Time Eilene Ghazi Time): 03/16/2021 9:23:58 AM Danielle at the office notified and will call the patient son

## 2021-03-16 NOTE — Telephone Encounter (Signed)
LVM for Michelle [name] to give the office a call back.

## 2021-03-16 NOTE — Telephone Encounter (Signed)
Pt's son called back that Illana is still having some confusion and she didn't sleep last night. Sent pt to triage.

## 2021-03-16 NOTE — Telephone Encounter (Signed)
Per Yong Channel see if pt can come in at 10 am tomorrow or see another provider if that does not work.

## 2021-03-16 NOTE — Telephone Encounter (Signed)
See below

## 2021-03-16 NOTE — Telephone Encounter (Signed)
Patient is scheduled for 10 am tomorrow.

## 2021-03-16 NOTE — Telephone Encounter (Signed)
Can you call and have pt sent to triage?

## 2021-03-17 ENCOUNTER — Ambulatory Visit (HOSPITAL_COMMUNITY): Admission: RE | Admit: 2021-03-17 | Payer: Medicare HMO | Source: Ambulatory Visit

## 2021-03-17 ENCOUNTER — Other Ambulatory Visit: Payer: Self-pay

## 2021-03-17 ENCOUNTER — Encounter: Payer: Self-pay | Admitting: Family Medicine

## 2021-03-17 ENCOUNTER — Ambulatory Visit (INDEPENDENT_AMBULATORY_CARE_PROVIDER_SITE_OTHER): Payer: Medicare HMO | Admitting: Family Medicine

## 2021-03-17 VITALS — BP 118/62 | HR 74 | Temp 98.4°F | Ht 65.0 in | Wt 188.8 lb

## 2021-03-17 DIAGNOSIS — I5032 Chronic diastolic (congestive) heart failure: Secondary | ICD-10-CM | POA: Diagnosis not present

## 2021-03-17 DIAGNOSIS — E1159 Type 2 diabetes mellitus with other circulatory complications: Secondary | ICD-10-CM | POA: Diagnosis not present

## 2021-03-17 DIAGNOSIS — I152 Hypertension secondary to endocrine disorders: Secondary | ICD-10-CM | POA: Diagnosis not present

## 2021-03-17 DIAGNOSIS — F0150 Vascular dementia without behavioral disturbance: Secondary | ICD-10-CM | POA: Diagnosis not present

## 2021-03-17 NOTE — Progress Notes (Signed)
Phone [phone] In person visit   Subjective:   Michelle [name] is a 87 y.o. year old very pleasant female patient who presents for/with See problem oriented charting Chief Complaint  Patient presents with   Dementia    Patient had an episode Monday where she didn't know what was going on or who anybody was. They are worried about the medication increase of the lasix and thinking that's what was the cause of the episode.     This visit occurred during the SARS-CoV-2 public health emergency.  Safety protocols were in place, including screening questions prior to the visit, additional usage of staff PPE, and extensive cleaning of exam room while observing appropriate contact time as indicated for disinfecting solutions.   Past Medical History-  Patient Active Problem List   Diagnosis Date Noted   Epilepsy ([place]) 03/12/2021    Priority: High   Dementia ([place]) 10/22/2020    Priority: High   Delirium 06/25/2020    Priority: High   Chronic diastolic CHF (congestive heart failure) ([place]) 03/13/2014    Priority: High   CAD- RCA PCI '90s, STEMI-RCA DES 02/18/14 03/05/2014    Priority: High   Type 2 diabetes mellitus without complication, with long-term current use of insulin ([place]) 03/26/2007    Priority: High   Anemia, iron deficiency 10/23/2015    Priority: Medium   CKD (chronic kidney disease), stage III ([place]) 07/25/2014    Priority: Medium   Osteoarthritis, knee 05/13/2014    Priority: Medium   Anxiety state 04/21/2014    Priority: Medium   SOB (shortness of breath) 03/05/2014    Priority: Medium   Hyperlipidemia associated with type 2 diabetes mellitus ([place]) 03/26/2007    Priority: Medium   Essential hypertension 04/21/2006    Priority: Medium   COPD (chronic obstructive pulmonary disease) ([place]) 04/21/2006    Priority: Medium   Actinic keratosis 11/17/2017    Priority: Low   Back pain, lumbosacral 10/10/2012    Priority: Low   Adnexal mass 11/24/2020   Thickened  endometrium 11/24/2020    Medications- reviewed and updated Current Outpatient Medications  Medication Sig Dispense Refill   Accu-Chek FastClix Lancets MISC Use to test blood sugars daily. Dx: E11.9 100 each 2   ACCU-CHEK SMARTVIEW test strip TEST BLOOD SUGAR THREE TIMES DAILY 300 strip 1   acetaminophen (TYLENOL) 650 MG CR tablet Take 1,300 mg by mouth 3 (three) times daily. Patient take 3 times a day.     albuterol (PROVENTIL) (2.5 MG/3ML) 0.083% nebulizer solution USE THREE MILLILITERS VIA NEBULIZATION BY MOUTH EVERY 6 HOURS AS NEEDED FOR WHEEZING OR SHORTNESS OF BREATH (Patient taking differently: Take 2.5 mg by nebulization every 6 (six) hours as needed for wheezing or shortness of breath.) 75 mL 3   albuterol (VENTOLIN HFA) 108 (90 Base) MCG/ACT inhaler INHALE 1 PUFF EVERY 4 HOURS AS NEEDED FOR WHEEZING, SHORTNESS OF BREATH (RESCUE INHALER IF ADVAIR NOT WORKING) 2 each 2   Alcohol Swabs (B-D SINGLE USE SWABS REGULAR) PADS USE  FOR  TESTING THREE TIMES DAILY 300 each 1   amLODipine (NORVASC) 10 MG tablet TAKE 1 TABLET EVERY DAY (Patient taking differently: Take 10 mg by mouth daily.) 90 tablet 3   aspirin 81 MG tablet Take 81 mg by mouth daily with breakfast.     Blood Glucose Calibration (ACCU-CHEK SMARTVIEW CONTROL) LIQD      blood glucose meter kit and supplies KIT Dispense based on patient and insurance preference. Use up to four  times daily as directed. (FOR ICD-9 250.00, 250.01). 1 each 0   calcium carbonate (OSCAL) 1500 (600 Ca) MG TABS tablet Take 600 mg of elemental calcium by mouth daily with breakfast.     carvedilol (COREG) 12.5 MG tablet Take 1 tablet (12.5 mg total) by mouth 2 (two) times daily. 60 tablet 3   cholecalciferol (VITAMIN D3) 25 MCG (1000 UNIT) tablet Take 1,000 Units by mouth daily with breakfast.     clopidogrel (PLAVIX) 75 MG tablet TAKE 1 TABLET EVERY DAY 90 tablet 1   diclofenac Sodium (VOLTAREN) 1 % GEL APPLY 2 GRAMS TOPICALLY TO AFFECTED AREA 4 TIMES DAILY  300 g 3   DROPLET PEN NEEDLES 31G X 8 MM MISC USE AS DIRECTED WITH LANTUS SOLOSTAR PEN 100 each 4   enalapril (VASOTEC) 20 MG tablet TAKE 1 TABLET TWICE DAILY (Patient taking differently: Take 20 mg by mouth 2 (two) times daily.) 180 tablet 3   fenofibrate micronized (LOFIBRA) 134 MG capsule Take 1 capsule (134 mg total) by mouth daily. 90 capsule 3   ferrous sulfate 325 (65 FE) MG tablet Twice a week (Patient taking differently: Take 325 mg by mouth 2 (two) times a week.) 18 tablet 3   Fluticasone-Salmeterol (ADVAIR) 250-50 MCG/DOSE AEPB INHALE 1 PUFF TWICE DAILY (Patient taking differently: Inhale 1 puff into the lungs in the morning and at bedtime.) 169 each 3   folic acid (FOLVITE) 678 MCG tablet Take 400 mcg by mouth daily with breakfast.     furosemide (LASIX) 20 MG tablet Take 1 tablet (20 mg total) by mouth daily. 90 tablet 2   hydrALAZINE (APRESOLINE) 25 MG tablet TAKE ONE TABLET BY MOUTH EVERY MORNING AS NEEDED FOR HIGH BLOOD PRESSURE 90 tablet 1   hydrALAZINE (APRESOLINE) 50 MG tablet Take 1 tablet (50 mg total) by mouth in the morning and at bedtime. 180 tablet 1   icosapent Ethyl (VASCEPA) 1 g capsule Take 2 capsules (2 g total) by mouth 2 (two) times daily. 360 capsule 3   lamoTRIgine (LAMICTAL) 25 MG tablet Take 1 tablet (25 mg total) by mouth 2 (two) times daily. 180 tablet 3   Lancets Misc. (ACCU-CHEK FASTCLIX LANCET) KIT 1 Device by Does not apply route. Use as directed for testing blood sugar     Lancets Misc. (ACCU-CHEK FASTCLIX LANCET) KIT Use as directed to test blood sugar 1 kit 1   LANTUS SOLOSTAR 100 UNIT/ML Solostar Pen INJECT  17  TO  20 UNITS SUBCUTANEOUSLY EVERY MORNING (Patient taking differently: Inject 18 Units into the skin 2 (two) times daily.) 30 mL 5   loratadine (CLARITIN) 10 MG tablet Take 10 mg by mouth every other day.     Melatonin 5 MG CAPS Take 5 mg by mouth at bedtime.     metFORMIN (GLUCOPHAGE-XR) 500 MG 24 hr tablet Take 1 tablet (500 mg total) by mouth  in the morning and at bedtime. 180 tablet 3   nitroGLYCERIN (NITROSTAT) 0.4 MG SL tablet PLACE 1 TABLET (0.4 MG TOTAL) UNDER THE TONGUE EVERY 5 (FIVE) MINUTESAS NEEDED FOR CHEST PAIN (CHEST PAIN OR SHORTNESS OF BREATH). (Patient taking differently: Place 0.4 mg under the tongue every 5 (five) minutes as needed for chest pain (max 3 doses).) 25 tablet 6   pantoprazole (PROTONIX) 40 MG tablet TAKE 1 TABLET EVERY DAY 90 tablet 1   rosuvastatin (CRESTOR) 40 MG tablet TAKE 1 TABLET EVERY DAY 90 tablet 0   senna-docusate (SENOKOT-S) 8.6-50 MG tablet Take 2 tablets by  mouth at bedtime. For AFTER surgery, do not take if having diarrhea 30 tablet 0   sertraline (ZOLOFT) 50 MG tablet TAKE 2 TABLETS AT BEDTIME 60 tablet 0   spironolactone (ALDACTONE) 25 MG tablet Take 1 tablet (25 mg total) by mouth daily. (Patient taking differently: Take 25 mg by mouth daily with breakfast.) 90 tablet 3   tiotropium (SPIRIVA HANDIHALER) 18 MCG inhalation capsule Place 1 capsule (18 mcg total) into inhaler and inhale daily. (Patient taking differently: Place 18 mcg into inhaler and inhale daily before breakfast.) 90 capsule 2   vitamin B-12 (CYANOCOBALAMIN) 1000 MCG tablet Take 1,000 mcg by mouth daily with breakfast.     vitamin E 400 UNIT capsule Take 400 Units by mouth at bedtime.     oxyCODONE (OXY IR/ROXICODONE) 5 MG immediate release tablet Take 0.5-1 tablets (2.5-5 mg total) by mouth every 6 (six) hours as needed for severe pain. For AFTER surgery only, do not take and drive (Patient not taking: Reported on 03/17/2021) 10 tablet 0   No current facility-administered medications for this visit.     Objective:  BP 118/62   Pulse 74   Temp 98.4 F (36.9 C) (Temporal)   Ht '5\' 5"'  (1.651 m)   Wt 188 lb 12.8 oz (85.6 kg)   SpO2 97%   BMI 31.42 kg/m  Gen: NAD, resting comfortably CV: RRR no murmurs rubs or gallops Lungs: CTAB no crackles, wheeze, rhonchi Abdomen: soft/nontender/nondistended/normal bowel sounds. No  rebound or guarding.  Ext: no edema-calf size now equal within 1 cm at 10 cm below tibial plateau Skin: warm, dry Neuro: Patient conversant    Assessment and Plan  #General health update-patient continued to follow with palliative care-correction from last [place] -Did not have a chance to discuss potential malignancy that led to postmenopausal bleeding with Dr. Berline Lopes on 03/15/2021-this will be rescheduled -still pending chest CT from prior left mid lung chnages- possible nodule  #Memory loss/vascular dementia with periods of confusion/sundowning-likely delirium S:Patient had an episode on Monday where she did not know where she was going or how anybody was in her family.  Family was worried recent increase in Lasix may have caused this issue.  Prior to that episode patient has had fewer episodes of confusion/sundowning though continued to have some short-term memory issues.  With neurology [facility] most recently checked 20 out of 30 with MoCA 13 out of 30-thought to be vascular dementia.  -See diastolic CHF section in regards to Lasix discussion -Denies low blood sugars when confusion occurred - confusion gradually improved when got off the twice daily lasix  A/P: I agree with family-sounds like with mild dehydration on increased Lasix patient had increased confusion-her symptoms have resolved back on once daily Lasix.  We discussed lower "brain reserve" and how she can be more easily triggered into increased confusion now/delirium-discussed follow-up indications  # Chronic diastolic CHF/left leg swelling S: continued to despite being on amlodipine- this may be counterbalanced by spironolactone 25 mg daily and Lasix 20 mg daily (switched to twice daily for 3 days to see if help with leg swelling) -no signs of fluid overload- stabled to edema -weight was down from last visit a few lbs- weight also up 7 lbs from last visit. Swelling was up some as well.  -today weight down 4 lbs on  BID lasix. Swelling down and only took 2 doses due to confusion that occurred on monday  -I was also concerned with her sedentary state that she could  have a clot in the left lower leg-this could simply be asymmetric swelling but she had some more prominent varicose veins on that leg.  No calf tenderness.  We discussed possibly going to the emergency room but opted in the end to try to get this done next week when both sons are available to help with her on Wednesday or later- If she had worsening symptoms such as calf pain or worsened leg swelling particular on the left agrees to go to the emergency room over the weekend. -She is scheduled for venous duplex later today -With diuresis and removing bilateral knee braces legs are now equal and continues to have no calf pain A/P: Chronic diastolic CHF appears improved with diuresis-weight down 4 pounds and edema resolved.  We also canceled venous duplex since legs are now equal in size after removing bilateral knee braces and diuresing.  Continue once daily Lasix for now  # Resistant Hypertension S: medication: Amlodipine 10 mg daily, lasix 20 mg daily, hydralazine 25 mg Am with 50 mg every 8 hours after, spironolactone 25 mg daily, carvedilol 12.5 mg BIDand enalapril 20 mg twice daily. BP Readings from Last 3 Encounters:  03/17/21 118/62  03/12/21 138/68  02/02/21 133/85  A/P: well controlled on repeat- slightly low initially- likely running lower from being less fluid overloaded.  Continue current medications as listed   Recommended follow up: Keep November visit Future Appointments  Date Time Provider [place]  03/29/2021  2:00 PM [facility] CCM PHARMACIST [facility]  06/07/2021  2:30 PM [facility] HEALTH COACH [facility]  06/15/2021  3:00 PM [name], MD [facility]  08/20/2021  3:30 PM [name], MD [facility] None    Lab/Order associations:   ICD-10-CM   1. Vascular dementia without behavioral disturbance ([place])   F01.50     2. Hypertension associated with diabetes ([place])  E11.59    I15.2     3. Chronic diastolic CHF (congestive heart failure) ([facility])  I50.32      I,Jada Bradford,acting as a scribe for [name], MD.,have documented all relevant documentation on the behalf of [name], MD,as directed by  [name], MD while in the presence of [name], MD.  I, [name], MD, have reviewed all documentation for this visit. The documentation on 03/17/21 for the exam, diagnosis, procedures, and orders are all accurate and complete.  Return precautions advised.  [name], MD

## 2021-03-17 NOTE — Patient Instructions (Addendum)
Health Maintenance Due  Topic Date Due   Zoster Vaccines- Shingrix (1 of 2)   - Please check with your pharmacy to see if they have the shingrix vaccine. If they do- please get this immunization and update [place] by phone call or mychart with dates you receive the vaccine  Never done   OPHTHALMOLOGY EXAM   -Patient will get this scheduled at her earliest convenience.   04/28/2018   COVID-19 Vaccine (4 - Booster for Moderna series)   - Please consider new Variant-Specific COVID-19 vaccination in the Fall.  01/03/2021   INFLUENZA VACCINE   - Please consider getting your flu shot in the Fall. If you get this outside of our office, please let us know.we will plan on november 02/15/2021     Team please cancel venous duplex today. [phone] Cardiology on [facility].  Glad you are feeling better please let us know if recurrent issues- lets stick with once daily lasix for now   Recommended follow up: keep November visit

## 2021-03-19 ENCOUNTER — Telehealth: Payer: Self-pay | Admitting: *Deleted

## 2021-03-19 ENCOUNTER — Inpatient Hospital Stay: Payer: Medicare HMO | Attending: Gynecologic Oncology | Admitting: Gynecologic Oncology

## 2021-03-19 ENCOUNTER — Encounter: Payer: Self-pay | Admitting: Gynecologic Oncology

## 2021-03-19 DIAGNOSIS — N9489 Other specified conditions associated with female genital organs and menstrual cycle: Secondary | ICD-10-CM

## 2021-03-19 DIAGNOSIS — R1900 Intra-abdominal and pelvic swelling, mass and lump, unspecified site: Secondary | ICD-10-CM | POA: Diagnosis not present

## 2021-03-19 NOTE — Telephone Encounter (Signed)
Patient's family member called back and scheduled appt

## 2021-03-19 NOTE — Telephone Encounter (Signed)
Attempted to reach the patient to reschedule her phone visit to today, left a message for the patient to call the office back

## 2021-03-19 NOTE — Progress Notes (Signed)
Gynecologic Oncology Telehealth Consult Note: Gyn-Onc  I connected with Michelle [name] on 03/19/21 at  3:00 PM EDT by telephone and verified that I am speaking with the correct person using two identifiers.  I discussed the limitations, risks, security and privacy concerns of performing an evaluation and management service by telemedicine and the availability of in-person appointments. I also discussed with the patient that there may be a patient responsible charge related to this service. The patient expressed understanding and agreed to proceed.  Other persons participating in the visit and their role in the encounter: patient's son.  Patient's location: home  Reason for Visit: review recent ultrasound  Treatment History: Presented in March 2022 with PMB Pelvic ultrasound on 11/17/20 showed complex mass within left adnexa, cystic with septations, measuring up to 9.9cm with 1 solid nodule along the wall with vascularity. No free fluid noted. Endometrium thickened measuring 71m with feeder vessel identified. CA-125: 11, CEA-1.53 EMB on 5/10: benign endometrial polyp. Patient declined surgery in 11/2020 given concerns about cardiac health and surviving surgery.   Interval History: Patient and her son, [name] present for phone visit with me today. She notes doing well since our last visit this spring. She endorses a good appetite without nausea, emesis, or early satiety. Weight is up and down, overall stable. She endorses regular bowel function, denies any abdominal or pelvic pain. She denies vaginal bleeding since her last visit with me.   Past Medical/Surgical History: Past Medical History:  Diagnosis Date   Anginal pain ([place]    Arthritis    AV block, 1st degree    CAD (coronary artery disease) 1990; 2015   Cardiac cath 1990 with Dr. SLia Foyerand pt reports blockage in artery  with angioplasty. She has pictures that show severe stenosis mid RCA and a post PTCA picture with 30% residual  stenosis post PTCA. Residual CAD, non obstructive per 2015 cath. STEMI status post stent in August 2015.   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   Promus DES to RCA with STEMI   Shortness of breath     Past Surgical History:  Procedure Laterality Date   CATARACT EXTRACTION  2020   right and left eye    CHOLECYSTECTOMY     thinks her appendix was removed at the same time   [place] 02/18/14   Promus DES to RCA   [place] 02[place]   [place]  Surgeon: JJettie Booze MD;  [facility]  Service: Cardiovascular;  Laterality: N/A;   PTCA  1990   PTCA of RCA   TRANSTHORACIC ECHOCARDIOGRAM  02/02/2012   mild LVH, EF 55-60%, Normal WM, Gr 1 DD; Mild MR    Family History  Problem Relation Age of Onset   Heart attack Mother 667  Cancer Father 640  Cancer Maternal Grandfather    Cancer Paternal Grandmother    Cancer Paternal Grandfather    Diabetes Son    Liver cancer Brother    Bone cancer Brother    Heart attack Son    Hypertension Son  Diabetes Son    Hyperlipidemia Son    Colon cancer Neg Hx    Ovarian cancer Neg Hx    Uterine cancer Neg Hx     Social History   Socioeconomic History   Marital status: Single    Spouse name: Not on file   Number of children: 2   Years of education: Not on file   Highest education level: Not on file  Occupational History   Occupation: Retired    Fish farm manager: RETIRED  Tobacco Use   Smoking status: Former    Packs/day: 1.00    Years: 55.00    Pack years: 55.00    Types: Cigarettes     Quit date: 09/29/2006    Years since quitting: 14.4   Smokeless tobacco: Never  Vaping Use   Vaping Use: Never used  Substance and Sexual Activity   Alcohol use: No   Drug use: No   Sexual activity: Not Currently  Other Topics Concern   Not on file  Social History Narrative   Lives with her son but he works all day. Home for dinner.  Independent ADL. Needs assist on some IADL. Right handed    Social Determinants of Health   Financial Resource Strain: Low Risk    Difficulty of Paying Living Expenses: Not hard at all  Food Insecurity: No Food Insecurity   Worried About Charity fundraiser in the Last Year: Never true   Ran Out of Food in the Last Year: Never true  Transportation Needs: No Transportation Needs   Lack of Transportation (Medical): No   Lack of Transportation (Non-Medical): No  Physical Activity: Inactive   Days of Exercise per Week: 0 days   Minutes of Exercise per Session: 0 min  Stress: No Stress Concern Present   Feeling of Stress : Not at all  Social Connections: Unknown   Frequency of Communication with Friends and Family: Once a week   Frequency of Social Gatherings with Friends and Family: Never   Attends Religious Services: Never   Marine scientist or Organizations: No   Attends Archivist Meetings: Never   Marital Status: Not on file    Current Medications:  Current Outpatient Medications:    Accu-Chek FastClix Lancets MISC, Use to test blood sugars daily. Dx: E11.9, Disp: 100 each, Rfl: 2   ACCU-CHEK SMARTVIEW test strip, TEST BLOOD SUGAR THREE TIMES DAILY, Disp: 300 strip, Rfl: 1   acetaminophen (TYLENOL) 650 MG CR tablet, Take 1,300 mg by mouth 3 (three) times daily. Patient take 3 times a day., Disp: , Rfl:    albuterol (PROVENTIL) (2.5 MG/3ML) 0.083% nebulizer solution, USE THREE MILLILITERS VIA NEBULIZATION BY MOUTH EVERY 6 HOURS AS NEEDED FOR WHEEZING OR SHORTNESS OF BREATH (Patient taking differently: Take 2.5 mg by  nebulization every 6 (six) hours as needed for wheezing or shortness of breath.), Disp: 75 mL, Rfl: 3   albuterol (VENTOLIN HFA) 108 (90 Base) MCG/ACT inhaler, INHALE 1 PUFF EVERY 4 HOURS AS NEEDED FOR WHEEZING, SHORTNESS OF BREATH (RESCUE INHALER IF ADVAIR NOT WORKING), Disp: 2 each, Rfl: 2   Alcohol Swabs (B-D SINGLE USE SWABS REGULAR) PADS, USE  FOR  TESTING THREE TIMES DAILY, Disp: 300 each, Rfl: 1   amLODipine (NORVASC) 10 MG tablet, TAKE 1 TABLET EVERY DAY (Patient taking differently: Take 10 mg by mouth daily.), Disp: 90 tablet, Rfl: 3   aspirin 81 MG tablet, Take 81 mg by mouth daily with breakfast., Disp: , Rfl:  Blood Glucose Calibration (ACCU-CHEK SMARTVIEW CONTROL) LIQD, , Disp: , Rfl:    blood glucose meter kit and supplies KIT, Dispense based on patient and insurance preference. Use up to four times daily as directed. (FOR ICD-9 250.00, 250.01)., Disp: 1 each, Rfl: 0   calcium carbonate (OSCAL) 1500 (600 Ca) MG TABS tablet, Take 600 mg of elemental calcium by mouth daily with breakfast., Disp: , Rfl:    carvedilol (COREG) 12.5 MG tablet, Take 1 tablet (12.5 mg total) by mouth 2 (two) times daily., Disp: 60 tablet, Rfl: 3   cholecalciferol (VITAMIN D3) 25 MCG (1000 UNIT) tablet, Take 1,000 Units by mouth daily with breakfast., Disp: , Rfl:    clopidogrel (PLAVIX) 75 MG tablet, TAKE 1 TABLET EVERY DAY, Disp: 90 tablet, Rfl: 1   diclofenac Sodium (VOLTAREN) 1 % GEL, APPLY 2 GRAMS TOPICALLY TO AFFECTED AREA 4 TIMES DAILY, Disp: 300 g, Rfl: 3   DROPLET PEN NEEDLES 31G X 8 MM MISC, USE AS DIRECTED WITH LANTUS SOLOSTAR PEN, Disp: 100 each, Rfl: 4   enalapril (VASOTEC) 20 MG tablet, TAKE 1 TABLET TWICE DAILY (Patient taking differently: Take 20 mg by mouth 2 (two) times daily.), Disp: 180 tablet, Rfl: 3   fenofibrate micronized (LOFIBRA) 134 MG capsule, Take 1 capsule (134 mg total) by mouth daily., Disp: 90 capsule, Rfl: 3   ferrous sulfate 325 (65 FE) MG tablet, Twice a week (Patient taking  differently: Take 325 mg by mouth 2 (two) times a week.), Disp: 18 tablet, Rfl: 3   Fluticasone-Salmeterol (ADVAIR) 250-50 MCG/DOSE AEPB, INHALE 1 PUFF TWICE DAILY (Patient taking differently: Inhale 1 puff into the lungs in the morning and at bedtime.), Disp: 180 each, Rfl: 3   folic acid (FOLVITE) 450 MCG tablet, Take 400 mcg by mouth daily with breakfast., Disp: , Rfl:    furosemide (LASIX) 20 MG tablet, Take 1 tablet (20 mg total) by mouth daily., Disp: 90 tablet, Rfl: 2   hydrALAZINE (APRESOLINE) 25 MG tablet, TAKE ONE TABLET BY MOUTH EVERY MORNING AS NEEDED FOR HIGH BLOOD PRESSURE, Disp: 90 tablet, Rfl: 1   hydrALAZINE (APRESOLINE) 50 MG tablet, Take 1 tablet (50 mg total) by mouth in the morning and at bedtime., Disp: 180 tablet, Rfl: 1   icosapent Ethyl (VASCEPA) 1 g capsule, Take 2 capsules (2 g total) by mouth 2 (two) times daily., Disp: 360 capsule, Rfl: 3   lamoTRIgine (LAMICTAL) 25 MG tablet, Take 1 tablet (25 mg total) by mouth 2 (two) times daily., Disp: 180 tablet, Rfl: 3   Lancets Misc. (ACCU-CHEK FASTCLIX LANCET) KIT, 1 Device by Does not apply route. Use as directed for testing blood sugar, Disp: , Rfl:    Lancets Misc. (ACCU-CHEK FASTCLIX LANCET) KIT, Use as directed to test blood sugar, Disp: 1 kit, Rfl: 1   LANTUS SOLOSTAR 100 UNIT/ML Solostar Pen, INJECT  17  TO  20 UNITS SUBCUTANEOUSLY EVERY MORNING (Patient taking differently: Inject 18 Units into the skin 2 (two) times daily.), Disp: 30 mL, Rfl: 5   loratadine (CLARITIN) 10 MG tablet, Take 10 mg by mouth every other day., Disp: , Rfl:    Melatonin 5 MG CAPS, Take 5 mg by mouth at bedtime., Disp: , Rfl:    metFORMIN (GLUCOPHAGE-XR) 500 MG 24 hr tablet, Take 1 tablet (500 mg total) by mouth in the morning and at bedtime., Disp: 180 tablet, Rfl: 3   nitroGLYCERIN (NITROSTAT) 0.4 MG SL tablet, PLACE 1 TABLET (0.4 MG TOTAL) UNDER THE TONGUE EVERY 5 (  FIVE) MINUTESAS NEEDED FOR CHEST PAIN (CHEST PAIN OR SHORTNESS OF BREATH). (Patient  taking differently: Place 0.4 mg under the tongue every 5 (five) minutes as needed for chest pain (max 3 doses).), Disp: 25 tablet, Rfl: 6   oxyCODONE (OXY IR/ROXICODONE) 5 MG immediate release tablet, Take 0.5-1 tablets (2.5-5 mg total) by mouth every 6 (six) hours as needed for severe pain. For AFTER surgery only, do not take and drive (Patient not taking: Reported on 03/17/2021), Disp: 10 tablet, Rfl: 0   pantoprazole (PROTONIX) 40 MG tablet, TAKE 1 TABLET EVERY DAY, Disp: 90 tablet, Rfl: 1   rosuvastatin (CRESTOR) 40 MG tablet, TAKE 1 TABLET EVERY DAY, Disp: 90 tablet, Rfl: 0   senna-docusate (SENOKOT-S) 8.6-50 MG tablet, Take 2 tablets by mouth at bedtime. For AFTER surgery, do not take if having diarrhea, Disp: 30 tablet, Rfl: 0   sertraline (ZOLOFT) 50 MG tablet, TAKE 2 TABLETS AT BEDTIME, Disp: 60 tablet, Rfl: 0   spironolactone (ALDACTONE) 25 MG tablet, Take 1 tablet (25 mg total) by mouth daily. (Patient taking differently: Take 25 mg by mouth daily with breakfast.), Disp: 90 tablet, Rfl: 3   tiotropium (SPIRIVA HANDIHALER) 18 MCG inhalation capsule, Place 1 capsule (18 mcg total) into inhaler and inhale daily. (Patient taking differently: Place 18 mcg into inhaler and inhale daily before breakfast.), Disp: 90 capsule, Rfl: 2   vitamin B-12 (CYANOCOBALAMIN) 1000 MCG tablet, Take 1,000 mcg by mouth daily with breakfast., Disp: , Rfl:    vitamin E 400 UNIT capsule, Take 400 Units by mouth at bedtime., Disp: , Rfl:   Review of Symptoms: Pertinent positives as per HPI.  Physical Exam: There were no vitals taken for this visit. Deferred given limitations of phone visit  Laboratory & Radiologic Studies: 03/08/21: Pelvic ultrasound 1. 10.7 x 7.9 x 11.2 cm complex cystic left adnexal mass with mural nodularity, concerning for a primary ovarian neoplasm. No free fluid within the pelvis. The native ovaries are not visualized. Additionally, further assessment with dedicated MRI of the pelvis, with  and without contrast, could be performed for further characterization as warranted. 2. Thickened endometrial complex measuring up to 16.9 mm with internal microcystic change and vascularity. Endometrial sampling should be considered to exclude carcinoma. 3. 1.2 cm calcified intramural fibroid.  Assessment & Plan: Michelle [name] is a 87 y.o. woman with mildly enlarging complex pelvic mass.  The patient remains relatively asymptomatic. I reviewed findings from recent ultrasound. Her endometrium continues to appear thickened. Given biopsy in May showing benign endometrial polyp, I don't see findings on imaging to suspect a malignant uterine process.  With regards to her ovarian mass, this has grown some and has some additional features that raise my concern for possible malignancy. The patient is still very fearful of surgery, stating that she "thinks [she] is too old and won't survive being cut." I offered reassurance as I could although acknowledged that her medical comorbidities, notably her heart disease, put her at increased surgical risk.  In terms of options moving forward, we discussed plan for surgery now (could do least invasive with D&C and robotic BSO) versus pelvic MRI to better characterize the ovarian mass. The patient and her son would like to get an MRI prior to making a decision about surgery. We will plan to discuss results by phone once her MRI has been performed and will make a final decision about proceeding with any surgical intervention.  I discussed the assessment and treatment plan with the patient. The  patient was provided with an opportunity to ask questions and all were answered. The patient agreed with the plan and demonstrated an understanding of the instructions.   The patient was advised to call back or see an in-person evaluation if the symptoms worsen or if the condition fails to improve as anticipated.   28 minutes of total time was spent for this patient encounter,  including preparation, over-the-phone counseling with the patient and coordination of care, and documentation of the encounter.   Jeral Pinch, MD  Division of Gynecologic Oncology  Department of Obstetrics and Gynecology  [facility]

## 2021-03-24 ENCOUNTER — Telehealth: Payer: Self-pay | Admitting: *Deleted

## 2021-03-24 NOTE — Telephone Encounter (Signed)
Per Dr Berline Lopes scheduled the patient for a MRI for 10/3 per family request. Called and spoke with the patient's son and gave date/time of appt

## 2021-03-25 NOTE — Progress Notes (Deleted)
Chronic Care Management Pharmacy Note  03/25/2021 Name:  Michelle [name] MRN:  [id number] DOB:  [birth year]  Summary: ***  Recommendations/Changes made from today's visit: ***  Plan: ***   Subjective: Michelle [name] is an 87 y.o. year old female who is a primary patient of Hunter, Brayton Mars, MD.  The CCM team was consulted for assistance with disease management and care coordination needs.    Engaged with patient by telephone for follow up visit in response to provider referral for pharmacy case management and/or care coordination services.   Consent to Services:  The patient was given the following information about Chronic Care Management services today, agreed to services, and gave verbal consent: 1. CCM service includes personalized support from designated clinical staff supervised by the primary care provider, including individualized plan of care and coordination with other care providers 2. 24/7 contact phone numbers for assistance for urgent and routine care needs. 3. Service will only be billed when office clinical staff spend 20 minutes or more in a month to coordinate care. 4. Only one practitioner may furnish and bill the service in a calendar month. 5.The patient may stop CCM services at any time (effective at the end of the month) by phone call to the office staff. 6. The patient will be responsible for cost sharing (co-pay) of up to 20% of the service fee (after annual deductible is met). Patient agreed to services and consent obtained.  Patient Care Team: [name], MD as PCP - General (Family Medicine) Burnell Blanks, MD as PCP - Cardiology (Cardiology) Karin Golden, MD as Consulting Physician (Dermatology) Bernarda Caffey, MD as Consulting Physician (Ophthalmology) Madelin Rear, [facility] as Pharmacist (Pharmacist) [name], MD as Consulting Physician (Neurology) Elease Hashimoto (Neurology)  Recent office visits:  01/14/2021 OV (PCP) [name], MD; Diabetes has been well controlled. With loose stools we are going to hold metformin for 1 week then restart with metformin extended release 500 mg twice daily down from 1000 mg twice daily of instant release.   01/07/2021 [name], MD; acute visit for chronic cough, recommended Zyrtec nightly and trial of plain Mucinex twice daily, follow up 3-4 weeks with PCP.   Recent consult visits:  02/02/2021 OV (neurology) [name], MD; increase Lamotrigine 25 mg twice daily, keep a seizure calendar, follow up in 6 months.   11/17/2020 OV (gynecology) [name], MD; postmenopausal bleeding, incidental finding o a large left complex ovarian cyst 9.9 x 9.8 x 8.3 cm with septations and one solid nodule along the wall with vascularity in an 87 yo with 2 Mis, COPD, cHTN, Hypercholesterolemia, DM. Refer to Gyneco-Oncology. 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   Hospital visits:  None in previous 6 months   Objective:  Lab Results  Component Value Date   CREATININE 0.95 01/14/2021   BUN 25 (H) 01/14/2021   GFR 54.43 (L) 01/14/2021   GFRNONAA >60 09/01/2020   GFRAA 77 07/01/2020   NA 138 01/14/2021   K 3.8 01/14/2021   CALCIUM 9.9 01/14/2021   CO2 26 01/14/2021   GLUCOSE 177 (H) 01/14/2021    Lab Results  Component Value Date/Time   HGBA1C 6.4 01/14/2021 03:15 PM   HGBA1C 6.5 (H) 08/26/2020 04:10 PM   GFR 54.43 (L) 01/14/2021 03:15 PM   GFR 55.98 (L) 09/04/2020 09:55 AM   MICROALBUR 0.7 01/07/2014 09:43 AM   MICROALBUR 0.2 07/08/2013 03:12 PM    Last diabetic Eye  exam:  Lab Results  Component Value Date/Time   HMDIABEYEEXA No Retinopathy 04/28/2017 12:00 AM    Last diabetic Foot exam:  Lab Results  Component Value Date/Time   HMDIABFOOTEX done 01/08/2014 12:00 AM     Lab Results  Component Value Date   CHOL 128 02/18/2020   HDL 49 (L) 02/18/2020   LDLCALC 42 02/18/2020   LDLDIRECT 40.0 07/23/2019   TRIG 355 (H)  02/18/2020   CHOLHDL 2.6 02/18/2020    Hepatic Function Latest Ref Rng & Units 01/14/2021 09/04/2020 09/01/2020  Total Protein 6.0 - 8.3 g/dL 7.6 6.7 5.7(L)  Albumin 3.5 - 5.2 g/dL 4.6 3.8 2.9(L)  AST 0 - 37 U/L '16 16 24  ' ALT 0 - 35 U/L '13 16 20  ' Alk Phosphatase 39 - 117 U/L 39 44 37(L)  Total Bilirubin 0.2 - 1.2 mg/dL 0.3 0.3 0.4  Bilirubin, Direct 0.0 - 0.3 mg/dL - - -    Lab Results  Component Value Date/Time   TSH 0.820 08/12/2020 06:33 PM   TSH 1.680 06/24/2020 10:15 PM   TSH 0.936 03/05/2014 02:45 PM   TSH 1.20 10/09/2009 08:43 AM    CBC Latest Ref Rng & Units 01/14/2021 09/04/2020 09/01/2020  WBC 4.0 - 10.5 K/uL 10.7(H) 8.4 8.3  Hemoglobin 12.0 - 15.0 g/dL 12.9 12.4 11.3(L)  Hematocrit 36.0 - 46.0 % 38.2 36.9 34.4(L)  Platelets 150.0 - 400.0 K/uL 272.0 249.0 240    No results found for: VD25OH  Clinical ASCVD: {YES/NO:21197} The ASCVD Risk score (Arnett DK, et al., 2019) failed to calculate for the following reasons:   The 2019 ASCVD risk score is only valid for ages 12 to 67    Depression screen PHQ 2/9 03/12/2021 01/14/2021 10/22/2020  Decreased Interest 0 0 0  Down, Depressed, Hopeless 0 0 0  PHQ - 2 Score 0 0 0  Altered sleeping - - 1  Tired, decreased energy - - 1  Change in appetite - - 0  Feeling bad or failure about yourself  - - 0  Trouble concentrating - - 0  Moving slowly or fidgety/restless - - 0  Suicidal thoughts - - 0  PHQ-9 Score - - 2  Difficult doing work/chores - - Not difficult at all  Some recent data might be hidden     ***Other: (CHADS2VASc if Afib, MMRC or CAT for COPD, ACT, DEXA)  Social History   Tobacco Use  Smoking Status Former   Packs/day: 1.00   Years: 55.00   Pack years: 55.00   Types: Cigarettes   Quit date: 09/29/2006   Years since quitting: 14.4  Smokeless Tobacco Never   BP Readings from Last 3 Encounters:  03/17/21 118/62  03/12/21 138/68  02/02/21 133/85   Pulse Readings from Last 3 Encounters:  03/17/21 74   03/12/21 78  02/02/21 72   Wt Readings from Last 3 Encounters:  03/17/21 188 lb 12.8 oz (85.6 kg)  03/12/21 192 lb (87.1 kg)  02/02/21 188 lb 3.2 oz (85.4 kg)   BMI Readings from Last 3 Encounters:  03/17/21 31.42 kg/m  03/12/21 31.95 kg/m  02/02/21 31.32 kg/m    Assessment/Interventions: Review of patient past medical history, allergies, medications, health status, including review of consultants reports, laboratory and other test data, was performed as part of comprehensive evaluation and provision of chronic care management services.   SDOH:  (Social Determinants of Health) assessments and interventions performed: {yes/no:20286}  SDOH Screenings   Alcohol Screen: Not on file  Depression (PHQ2-9):  Low Risk    PHQ-2 Score: 0  Financial Resource Strain: Low Risk    Difficulty of Paying Living Expenses: Not hard at all  Food Insecurity: No Food Insecurity   Worried About Charity fundraiser in the Last Year: Never true   Ran Out of Food in the Last Year: Never true  Housing: Low Risk    Last Housing Risk Score: 0  Physical Activity: Inactive   Days of Exercise per Week: 0 days   Minutes of Exercise per Session: 0 min  Social Connections: Unknown   Frequency of Communication with Friends and Family: Once a week   Frequency of Social Gatherings with Friends and Family: Never   Attends Religious Services: Never   Marine scientist or Organizations: No   Attends Music therapist: Never   Marital Status: Not on file  Stress: No Stress Concern Present   Feeling of Stress : Not at all  Tobacco Use: Medium Risk   Smoking Tobacco Use: Former   Smokeless Tobacco Use: Never  Transportation Needs: No Data processing manager (Medical): No   Lack of Transportation (Non-Medical): No    CCM Care Plan  Allergies  Allergen Reactions   Codeine Sulfate Nausea Only   Morphine Sulfate Nausea Only    Medications Reviewed Today      Reviewed by [name], MD (Physician) on 03/19/21 at [place] List Status: <None>   Medication Order Taking? Sig Documenting Provider Last Dose Status Informant  Accu-Chek FastClix Lancets MISC [id number] No Use to test blood sugars daily. Dx: E11.9 [name], MD Taking Active Family Member  ACCU-CHEK SMARTVIEW test strip [id number] No TEST BLOOD SUGAR THREE TIMES DAILY [name], MD Taking Active Family Member  acetaminophen (TYLENOL) 650 MG CR tablet [id number] No Take 1,300 mg by mouth 3 (three) times daily. Patient take 3 times a day. [provider] Taking Active Family Member  albuterol (PROVENTIL) (2.5 MG/3ML) 0.083% nebulizer solution [id number] No USE THREE MILLILITERS VIA NEBULIZATION BY MOUTH EVERY 6 HOURS AS NEEDED FOR WHEEZING OR SHORTNESS OF BREATH  Patient taking differently: Take 2.5 mg by nebulization every 6 (six) hours as needed for wheezing or shortness of breath.   [name], MD Taking Active Child  albuterol (VENTOLIN HFA) 108 (90 Base) MCG/ACT inhaler [id number] No INHALE 1 PUFF EVERY 4 HOURS AS NEEDED FOR WHEEZING, SHORTNESS OF BREATH (RESCUE INHALER IF ADVAIR NOT WORKING) [name], MD Taking Active   Alcohol Swabs (B-D SINGLE USE SWABS REGULAR) PADS [id number] No USE  FOR  TESTING THREE TIMES DAILY [name], MD Taking Active   amLODipine (NORVASC) 10 MG tablet [id number] No TAKE 1 TABLET EVERY DAY  Patient taking differently: Take 10 mg by mouth daily.   [name], MD Taking Active            Med Note Vianne Bulls Dec 02, 2020  2:45 PM)    aspirin 81 MG tablet [id number] No Take 81 mg by mouth daily with breakfast. [provider] Taking Active Family Member           Med Note Alfonse Spruce, ANH T   Thu Aug 13, 2020  9:48 AM)    Blood Glucose Calibration (ACCU-CHEK SMARTVIEW CONTROL) Yehuda Budd [id number] No  [provider] Taking Active Family Member  blood glucose meter kit and supplies KIT  [id number] No Dispense based on patient and insurance  preference. Use up to four times daily as directed. (FOR ICD-9 250.00, 250.01). [name], MD Taking Active Family Member  calcium carbonate (OSCAL) 1500 (600 Ca) MG TABS tablet [id number] No Take 600 mg of elemental calcium by mouth daily with breakfast. [provider] Taking Active Family Member  carvedilol (COREG) 12.5 MG tablet [id number] No Take 1 tablet (12.5 mg total) by mouth 2 (two) times daily. Skeet Latch, MD Taking Expired 03/17/21 2359 Family Member  cholecalciferol (VITAMIN D3) 25 MCG (1000 UNIT) tablet [id number] No Take 1,000 Units by mouth daily with breakfast. [provider] Taking Active Family Member  clopidogrel (PLAVIX) 75 MG tablet [id number] No TAKE 1 TABLET EVERY DAY [name], MD Taking Active   diclofenac Sodium (VOLTAREN) 1 % GEL [id number] No APPLY 2 GRAMS TOPICALLY TO AFFECTED AREA 4 TIMES DAILY [name], MD Taking Active   DROPLET PEN NEEDLES 31G X 8 MM MISC [id number] No USE AS DIRECTED WITH LANTUS SOLOSTAR PEN [name], MD Taking Active   enalapril (VASOTEC) 20 MG tablet [id number] No TAKE 1 TABLET TWICE DAILY  Patient taking differently: Take 20 mg by mouth 2 (two) times daily.   [name], MD Taking Active   fenofibrate micronized (LOFIBRA) 134 MG capsule [id number] No Take 1 capsule (134 mg total) by mouth daily. [name], MD Taking Active Family Member           Med Note Vianne Bulls Dec 02, 2020  2:47 PM)    ferrous sulfate 325 (65 FE) MG tablet [id number] No Twice a week  Patient taking differently: Take 325 mg by mouth 2 (two) times a week.   [name], MD Taking Active   Fluticasone-Salmeterol (ADVAIR) 250-50 MCG/DOSE AEPB [id number] No INHALE 1 PUFF TWICE DAILY  Patient taking differently: Inhale 1 puff into the lungs in the morning and at bedtime.   [name], MD Taking Active   folic acid (FOLVITE) 786 MCG  tablet [id number] No Take 400 mcg by mouth daily with breakfast. [provider] Taking Active Family Member  furosemide (LASIX) 20 MG tablet [id number] No Take 1 tablet (20 mg total) by mouth daily. Rockne Menghini, RPH-CPP Taking Expired 03/17/21 2359 Family Member  hydrALAZINE (APRESOLINE) 25 MG tablet [id number] No TAKE ONE TABLET BY MOUTH EVERY MORNING AS NEEDED FOR HIGH BLOOD PRESSURE Skeet Latch, MD Taking Active   hydrALAZINE (APRESOLINE) 50 MG tablet [id number] No Take 1 tablet (50 mg total) by mouth in the morning and at bedtime. Skeet Latch, MD Taking Active   icosapent Ethyl (VASCEPA) 1 g capsule [id number] No Take 2 capsules (2 g total) by mouth 2 (two) times daily. Skeet Latch, MD Taking Active   lamoTRIgine (LAMICTAL) 25 MG tablet [id number] No Take 1 tablet (25 mg total) by mouth 2 (two) times daily. [name], MD Taking Active   Lancets Misc. [facility] FASTCLIX LANCET) KIT [id number] No 1 Device by Does not apply route. Use as directed for testing blood sugar [provider] Taking Active Family Member  Lancets Misc. (ACCU-CHEK FASTCLIX LANCET) KIT [id number] No Use as directed to test blood sugar [name], MD Taking Active Family Member  LANTUS SOLOSTAR 100 UNIT/ML Solostar Pen [id number] No INJECT  17  TO  20 UNITS SUBCUTANEOUSLY EVERY MORNING  Patient taking differently: Inject 18 Units into the skin 2 (two) times daily.   [name], MD Taking Active   loratadine (CLARITIN) 10  MG tablet [id number] No Take 10 mg by mouth every other day. [provider] Taking Active Family Member  Melatonin 5 MG CAPS [id number] No Take 5 mg by mouth at bedtime. [provider] Taking Active Family Member  metFORMIN (GLUCOPHAGE-XR) 500 MG 24 hr tablet [id number] No Take 1 tablet (500 mg total) by mouth in the morning and at bedtime. [name], MD Taking Active   nitroGLYCERIN (NITROSTAT) 0.4 MG SL tablet [id number] No PLACE 1  TABLET (0.4 MG TOTAL) UNDER THE TONGUE EVERY 5 (FIVE) MINUTESAS NEEDED FOR CHEST PAIN (CHEST PAIN OR SHORTNESS OF BREATH).  Patient taking differently: Place 0.4 mg under the tongue every 5 (five) minutes as needed for chest pain (max 3 doses).   Burnell Blanks, MD Taking Active   oxyCODONE (OXY IR/ROXICODONE) 5 MG immediate release tablet [id number] No Take 0.5-1 tablets (2.5-5 mg total) by mouth every 6 (six) hours as needed for severe pain. For AFTER surgery only, do not take and drive  Patient not taking: Reported on 03/17/2021   Joylene John D, NP Not Taking Active   pantoprazole (PROTONIX) 40 MG tablet [id number] No TAKE 1 TABLET EVERY DAY [name], MD Taking Active   rosuvastatin (CRESTOR) 40 MG tablet [id number] No TAKE 1 TABLET EVERY DAY [name], MD Taking Active   senna-docusate (SENOKOT-S) 8.6-50 MG tablet [id number] No Take 2 tablets by mouth at bedtime. For AFTER surgery, do not take if having diarrhea Joylene John D, NP Taking Active   sertraline (ZOLOFT) 50 MG tablet [id number] No TAKE 2 TABLETS AT BEDTIME [name], MD Taking Active   spironolactone (ALDACTONE) 25 MG tablet [id number] No Take 1 tablet (25 mg total) by mouth daily.  Patient taking differently: Take 25 mg by mouth daily with breakfast.   [name], MD Taking Active   tiotropium [facility] HANDIHALER) 18 MCG inhalation capsule [id number] No Place 1 capsule (18 mcg total) into inhaler and inhale daily.  Patient taking differently: Place 18 mcg into inhaler and inhale daily before breakfast.   [name], MD Taking Active   vitamin B-12 (CYANOCOBALAMIN) 1000 MCG tablet [id number] No Take 1,000 mcg by mouth daily with breakfast. [provider] Taking Active Family Member  vitamin E 400 UNIT capsule [id number] No Take 400 Units by mouth at bedtime. [provider] Taking Active Family Member            Patient Active Problem List   Diagnosis Date Noted    Epilepsy ([place]) 03/12/2021   Adnexal mass 11/24/2020   Thickened endometrium 11/24/2020   Dementia ([place]) 10/22/2020   Delirium 06/25/2020   Actinic keratosis 11/17/2017   Anemia, iron deficiency 10/23/2015   CKD (chronic kidney disease), stage III ([place]) 07/25/2014   Osteoarthritis, knee 05/13/2014   Anxiety state 04/21/2014   Chronic diastolic CHF (congestive heart failure) ([place]) 03/13/2014   SOB (shortness of breath) 03/05/2014   CAD- RCA PCI '90s, STEMI-RCA DES 02/18/14 03/05/2014   Back pain, lumbosacral 10/10/2012   Type 2 diabetes mellitus without complication, with long-term current use of insulin ([place]) 03/26/2007   Hyperlipidemia associated with type 2 diabetes mellitus ([place]) 03/26/2007   Essential hypertension 04/21/2006   COPD (chronic obstructive pulmonary disease) ([place]) 04/21/2006    Immunization History  Administered Date(s) Administered   Fluad Quad(high Dose 65+) 08/17/2020   Influenza Split 04/21/2011, 05/16/2012   Influenza Whole 05/24/2007, 05/01/2009, 04/17/2010   Influenza, High Dose Seasonal PF 03/23/2018, 05/25/2019   Influenza,inj,Quad PF,6+  Mos 04/14/2013, 04/10/2014, 04/03/2015   Influenza-Unspecified 06/01/2016, 06/15/2017, 05/24/2020   Moderna Sars-Covid-2 Vaccination 10/25/2019, 11/22/2019, 10/03/2020   Pneumococcal Conjugate-13 09/22/2014, 05/25/2019   Pneumococcal Polysaccharide-23 04/18/2003   Tdap 07/29/2011   Zoster, Live 04/18/2011    Conditions to be addressed/monitored:  CAD, HTN, CHF, COPD, HLD, Type II DM, Dementia  There are no care plans that you recently modified to display for this patient.    Medication Assistance: {MEDASSISTANCEINFO:25044}  Compliance/Adherence/Medication fill history: Care Gaps: ***  Star-Rating Drugs: ***  Patient's preferred pharmacy is:  [id number] - [address], [place] [address] Phone: [phone] Fax: [phone]  [facility] ([facility] - [place], [place] [address] Phone: [phone] Fax: [phone]  Uses pill box? {Yes or If no, why not?:20788} Pt endorses ***% compliance  We discussed: {Pharmacy options:24294} Patient decided to: {US Pharmacy Plan:23885}  Care Plan and Follow Up Patient Decision:  {FOLLOWUP:24991}  Plan: {CM FOLLOW UP MVVK:12244}  ***   Current Barriers:  {pharmacybarriers:24917}  Pharmacist Clinical Goal(s):  Patient will {PHARMACYGOALCHOICES:24921} through collaboration with PharmD and provider.   Interventions: 1:1 collaboration with [name], MD regarding development and update of comprehensive plan of care as evidenced by provider attestation and co-signature Inter-disciplinary care team collaboration (see longitudinal plan of care) Comprehensive medication review performed; medication list updated in electronic medical record  {CCM PHARMD DISEASE STATES:25130}  Patient Goals/Self-Care Activities Patient will:  - {pharmacypatientgoals:24919}  Follow Up Plan: {CM FOLLOW UP LPNP:00511}

## 2021-03-26 ENCOUNTER — Other Ambulatory Visit: Payer: Self-pay | Admitting: Family Medicine

## 2021-03-29 ENCOUNTER — Telehealth: Payer: Medicare HMO

## 2021-03-30 ENCOUNTER — Ambulatory Visit (INDEPENDENT_AMBULATORY_CARE_PROVIDER_SITE_OTHER): Payer: Medicare HMO | Admitting: Pharmacist

## 2021-03-30 DIAGNOSIS — I5032 Chronic diastolic (congestive) heart failure: Secondary | ICD-10-CM

## 2021-03-30 DIAGNOSIS — E119 Type 2 diabetes mellitus without complications: Secondary | ICD-10-CM

## 2021-03-30 DIAGNOSIS — Z794 Long term (current) use of insulin: Secondary | ICD-10-CM

## 2021-03-30 DIAGNOSIS — I10 Essential (primary) hypertension: Secondary | ICD-10-CM

## 2021-03-30 NOTE — Progress Notes (Signed)
Chronic Care Management Pharmacy Note  03/30/2021 Name:  Michelle [name] MRN:  [id number] DOB:  Jul 18, 1933  Summary: Pharmd follow up.  Biggest complain is cough persisting over the past few weeks that has gotten progressively more often.  She reports occasional clear mucous when she coughs.  No other cold symptoms.  Taking both inhalers as prescribed and no noticeable swelling.  Asked for recommendation as cough drops are no longer providing relief.  Recommendations/Changes made from today's visit: Cautioned with anticholinergic properties of most cough/cold products = increased confusion.  Unclear etiology of cough - any suggestions for them?  Plan: FU 6 months or sooner if needed   Subjective: Michelle [name] is an 87 y.o. year old female who is a primary patient of Hunter, Brayton Mars, MD.  The CCM team was consulted for assistance with disease management and care coordination needs.    Engaged with patient by telephone for follow up visit in response to provider referral for pharmacy case management and/or care coordination services.   Consent to Services:  The patient was given the following information about Chronic Care Management services today, agreed to services, and gave verbal consent: 1. CCM service includes personalized support from designated clinical staff supervised by the primary care provider, including individualized plan of care and coordination with other care providers 2. 24/7 contact phone numbers for assistance for urgent and routine care needs. 3. Service will only be billed when office clinical staff spend 20 minutes or more in a month to coordinate care. 4. Only one practitioner may furnish and bill the service in a calendar month. 5.The patient may stop CCM services at any time (effective at the end of the month) by phone call to the office staff. 6. The patient will be responsible for cost sharing (co-pay) of up to 20% of the service fee (after annual deductible is  met). Patient agreed to services and consent obtained.  Patient Care Team: [name], MD as PCP - General (Family Medicine) Burnell Blanks, MD as PCP - Cardiology (Cardiology) Karin Golden, MD as Consulting Physician (Dermatology) Bernarda Caffey, MD as Consulting Physician (Ophthalmology) Madelin Rear, [facility] as Pharmacist (Pharmacist) [name], MD as Consulting Physician (Neurology) Elease Hashimoto (Neurology)  Recent office visits:  01/14/2021 OV (PCP) [name], MD; Diabetes has been well controlled. With loose stools we are going to hold metformin for 1 week then restart with metformin extended release 500 mg twice daily down from 1000 mg twice daily of instant release.   01/07/2021 [name], MD; acute visit for chronic cough, recommended Zyrtec nightly and trial of plain Mucinex twice daily, follow up 3-4 weeks with PCP.   Recent consult visits:  02/02/2021 OV (neurology) [name], MD; increase Lamotrigine 25 mg twice daily, keep a seizure calendar, follow up in 6 months.   11/17/2020 OV (gynecology) [name], MD; postmenopausal bleeding, incidental finding o a large left complex ovarian cyst 9.9 x 9.8 x 8.3 cm with septations and one solid nodule along the wall with vascularity in an 87 yo with 2 Mis, COPD, cHTN, Hypercholesterolemia, DM. Refer to Gyneco-Oncology. 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   Hospital visits:  None in previous 6 months     Objective:  Lab Results  Component Value Date   CREATININE 0.95 01/14/2021   BUN 25 (H) 01/14/2021   GFR 54.43 (L) 01/14/2021   GFRNONAA >60 09/01/2020   GFRAA 77 07/01/2020   NA  138 01/14/2021   K 3.8 01/14/2021   CALCIUM 9.9 01/14/2021   CO2 26 01/14/2021   GLUCOSE 177 (H) 01/14/2021    Lab Results  Component Value Date/Time   HGBA1C 6.4 01/14/2021 03:15 PM   HGBA1C 6.5 (H) 08/26/2020 04:10 PM   GFR 54.43 (L) 01/14/2021 03:15 PM   GFR  55.98 (L) 09/04/2020 09:55 AM   MICROALBUR 0.7 01/07/2014 09:43 AM   MICROALBUR 0.2 07/08/2013 03:12 PM    Last diabetic Eye exam:  Lab Results  Component Value Date/Time   HMDIABEYEEXA No Retinopathy 04/28/2017 12:00 AM    Last diabetic Foot exam:  Lab Results  Component Value Date/Time   HMDIABFOOTEX done 01/08/2014 12:00 AM     Lab Results  Component Value Date   CHOL 128 02/18/2020   HDL 49 (L) 02/18/2020   LDLCALC 42 02/18/2020   LDLDIRECT 40.0 07/23/2019   TRIG 355 (H) 02/18/2020   CHOLHDL 2.6 02/18/2020    Hepatic Function Latest Ref Rng & Units 01/14/2021 09/04/2020 09/01/2020  Total Protein 6.0 - 8.3 g/dL 7.6 6.7 5.7(L)  Albumin 3.5 - 5.2 g/dL 4.6 3.8 2.9(L)  AST 0 - 37 U/L _0 ALT 0 - 35 U/L _1 Alk Phosphatase 39 - 117 U/L 39 44 37(L)  Total Bilirubin 0.2 - 1.2 mg/dL 0.3 0.3 0.4  Bilirubin, Direct 0.0 - 0.3 mg/dL - - -    Lab Results  Component Value Date/Time   TSH 0.820 08/12/2020 06:33 PM   TSH 1.680 06/24/2020 10:15 PM   TSH 0.936 03/05/2014 02:45 PM   TSH 1.20 10/09/2009 08:43 AM    CBC Latest Ref Rng & Units 01/14/2021 09/04/2020 09/01/2020  WBC 4.0 - 10.5 K/uL 10.7(H) 8.4 8.3  Hemoglobin 12.0 - 15.0 g/dL 12.9 12.4 11.3(L)  Hematocrit 36.0 - 46.0 % 38.2 36.9 34.4(L)  Platelets 150.0 - 400.0 K/uL 272.0 249.0 240    No results found for: VD25OH  Clinical ASCVD: No  The ASCVD Risk score (Arnett DK, et al., 2019) failed to calculate for the following reasons:   The 2019 ASCVD risk score is only valid for ages 41 to 104    Depression screen PHQ 2/9 03/12/2021 01/14/2021 10/22/2020  Decreased Interest 0 0 0  Down, Depressed, Hopeless 0 0 0  PHQ - 2 Score 0 0 0  Altered sleeping - - 1  Tired, decreased energy - - 1  Change in appetite - - 0  Feeling bad or failure about yourself  - - 0  Trouble concentrating - - 0  Moving slowly or fidgety/restless - - 0  Suicidal thoughts - - 0  PHQ-9 Score - - 2  Difficult doing work/chores - - Not  difficult at all  Some recent data might be hidden     Social History   Tobacco Use  Smoking Status Former   Packs/day: 1.00   Years: 55.00   Pack years: 55.00   Types: Cigarettes   Quit date: 09/29/2006   Years since quitting: 14.5  Smokeless Tobacco Never   BP Readings from Last 3 Encounters:  03/17/21 118/62  03/12/21 138/68  02/02/21 133/85   Pulse Readings from Last 3 Encounters:  03/17/21 74  03/12/21 78  02/02/21 72   Wt Readings from Last 3 Encounters:  03/17/21 188 lb 12.8 oz (85.6 kg)  03/12/21 192 lb (87.1 kg)  02/02/21 188 lb 3.2 oz (85.4 kg)   BMI Readings from Last 3 Encounters:  03/17/21 31.42 kg/m  03/12/21 31.95 kg/m  02/02/21 31.32 kg/m    Assessment/Interventions: Review of patient past medical history, allergies, medications, health status, including review of consultants reports, laboratory and other test data, was performed as part of comprehensive evaluation and provision of chronic care management services.   SDOH:  (Social Determinants of Health) assessments and interventions performed: Yes  Financial Resource Strain: Low Risk    Difficulty of Paying Living Expenses: Not hard at all    SDOH Screenings   Alcohol Screen: Not on file  Depression (PHQ2-9): Low Risk    PHQ-2 Score: 0  Financial Resource Strain: Low Risk    Difficulty of Paying Living Expenses: Not hard at all  Food Insecurity: No Food Insecurity   Worried About Charity fundraiser in the Last Year: Never true   Arboriculturist in the Last Year: Never true  Housing: Low Risk    Last Housing Risk Score: 0  Physical Activity: Inactive   Days of Exercise per Week: 0 days   Minutes of Exercise per Session: 0 min  Social Connections: Unknown   Frequency of Communication with Friends and Family: Once a week   Frequency of Social Gatherings with Friends and Family: Never   Attends Religious Services: Never   Marine scientist or Organizations: No   Attends Programme researcher, broadcasting/film/video: Never   Marital Status: Not on file  Stress: No Stress Concern Present   Feeling of Stress : Not at all  Tobacco Use: Medium Risk   Smoking Tobacco Use: Former   Smokeless Tobacco Use: Never  Transportation Needs: No Data processing manager (Medical): No   Lack of Transportation (Non-Medical): No    CCM Care Plan  Allergies  Allergen Reactions   Codeine Sulfate Nausea Only   Morphine Sulfate Nausea Only    Medications Reviewed Today     Reviewed by [name], MD (Physician) on 03/19/21 at [place] List Status: <None>   Medication Order Taking? Sig Documenting Provider Last Dose Status Informant  Accu-Chek FastClix Lancets MISC [id number] No Use to test blood sugars daily. Dx: E11.9 [name], MD Taking Active Family Member  ACCU-CHEK SMARTVIEW test strip [id number] No TEST BLOOD SUGAR THREE TIMES DAILY [name], MD Taking Active Family Member  acetaminophen (TYLENOL) 650 MG CR tablet [id number] No Take 1,300 mg by mouth 3 (three) times daily. Patient take 3 times a day. [provider] Taking Active Family Member  albuterol (PROVENTIL) (2.5 MG/3ML) 0.083% nebulizer solution [id number] No USE THREE MILLILITERS VIA NEBULIZATION BY MOUTH EVERY 6 HOURS AS NEEDED FOR WHEEZING OR SHORTNESS OF BREATH  Patient taking differently: Take 2.5 mg by nebulization every 6 (six) hours as needed for wheezing or shortness of breath.   [name], MD Taking Active Child  albuterol (VENTOLIN HFA) 108 (90 Base) MCG/ACT inhaler [id number] No INHALE 1 PUFF EVERY 4 HOURS AS NEEDED FOR WHEEZING, SHORTNESS OF BREATH (RESCUE INHALER IF ADVAIR NOT WORKING) [name], MD Taking Active   Alcohol Swabs (B-D SINGLE USE SWABS REGULAR) PADS [id number] No USE  FOR  TESTING THREE TIMES DAILY [name], MD Taking Active   amLODipine (NORVASC) 10 MG tablet [id number] No TAKE 1 TABLET EVERY DAY  Patient taking differently:  Take 10 mg by mouth daily.   [name], MD Taking Active            Med Note Kellie Simmering, [place] C  Wed Dec 02, 2020  2:45 PM)    aspirin 81 MG tablet [id number] No Take 81 mg by mouth daily with breakfast. [provider] Taking Active Family Member           Med Note Alfonse Spruce, ANH T   Thu Aug 13, 2020  9:48 AM)    Blood Glucose Calibration (ACCU-CHEK SMARTVIEW CONTROL) Yehuda Budd [id number] No  [provider] Taking Active Family Member  blood glucose meter kit and supplies KIT [id number] No Dispense based on patient and insurance preference. Use up to four times daily as directed. (FOR ICD-9 250.00, 250.01). [name], MD Taking Active Family Member  calcium carbonate (OSCAL) 1500 (600 Ca) MG TABS tablet [id number] No Take 600 mg of elemental calcium by mouth daily with breakfast. [provider] Taking Active Family Member  carvedilol (COREG) 12.5 MG tablet [id number] No Take 1 tablet (12.5 mg total) by mouth 2 (two) times daily. Skeet Latch, MD Taking Expired 03/17/21 2359 Family Member  cholecalciferol (VITAMIN D3) 25 MCG (1000 UNIT) tablet [id number] No Take 1,000 Units by mouth daily with breakfast. [provider] Taking Active Family Member  clopidogrel (PLAVIX) 75 MG tablet [id number] No TAKE 1 TABLET EVERY DAY [name], MD Taking Active   diclofenac Sodium (VOLTAREN) 1 % GEL [id number] No APPLY 2 GRAMS TOPICALLY TO AFFECTED AREA 4 TIMES DAILY [name], MD Taking Active   DROPLET PEN NEEDLES 31G X 8 MM MISC [id number] No USE AS DIRECTED WITH LANTUS SOLOSTAR PEN [name], MD Taking Active   enalapril (VASOTEC) 20 MG tablet [id number] No TAKE 1 TABLET TWICE DAILY  Patient taking differently: Take 20 mg by mouth 2 (two) times daily.   [name], MD Taking Active   fenofibrate micronized (LOFIBRA) 134 MG capsule [id number] No Take 1 capsule (134 mg total) by mouth daily. [name], MD Taking Active Family Member            Med Note Vianne Bulls Dec 02, 2020  2:47 PM)    ferrous sulfate 325 (65 FE) MG tablet [id number] No Twice a week  Patient taking differently: Take 325 mg by mouth 2 (two) times a week.   [name], MD Taking Active   Fluticasone-Salmeterol (ADVAIR) 250-50 MCG/DOSE AEPB [id number] No INHALE 1 PUFF TWICE DAILY  Patient taking differently: Inhale 1 puff into the lungs in the morning and at bedtime.   [name], MD Taking Active   folic acid (FOLVITE) 263 MCG tablet [id number] No Take 400 mcg by mouth daily with breakfast. [provider] Taking Active Family Member  furosemide (LASIX) 20 MG tablet [id number] No Take 1 tablet (20 mg total) by mouth daily. Rockne Menghini, RPH-CPP Taking Expired 03/17/21 2359 Family Member  hydrALAZINE (APRESOLINE) 25 MG tablet [id number] No TAKE ONE TABLET BY MOUTH EVERY MORNING AS NEEDED FOR HIGH BLOOD PRESSURE Skeet Latch, MD Taking Active   hydrALAZINE (APRESOLINE) 50 MG tablet [id number] No Take 1 tablet (50 mg total) by mouth in the morning and at bedtime. Skeet Latch, MD Taking Active   icosapent Ethyl (VASCEPA) 1 g capsule [id number] No Take 2 capsules (2 g total) by mouth 2 (two) times daily. Skeet Latch, MD Taking Active   lamoTRIgine (LAMICTAL) 25 MG tablet [id number] No Take 1 tablet (25 mg total) by mouth 2 (two) times daily. [name], MD Taking Active   Lancets Misc. [facility] FASTCLIX LANCET) KIT [id number] No 1  Device by Does not apply route. Use as directed for testing blood sugar [provider] Taking Active Family Member  Lancets Misc. (ACCU-CHEK FASTCLIX LANCET) KIT [id number] No Use as directed to test blood sugar [name], MD Taking Active Family Member  LANTUS SOLOSTAR 100 UNIT/ML Solostar Pen [id number] No INJECT  17  TO  20 UNITS SUBCUTANEOUSLY EVERY MORNING  Patient taking differently: Inject 18 Units into the skin 2 (two) times daily.   [name], MD  Taking Active   loratadine (CLARITIN) 10 MG tablet [id number] No Take 10 mg by mouth every other day. [provider] Taking Active Family Member  Melatonin 5 MG CAPS [id number] No Take 5 mg by mouth at bedtime. [provider] Taking Active Family Member  metFORMIN (GLUCOPHAGE-XR) 500 MG 24 hr tablet [id number] No Take 1 tablet (500 mg total) by mouth in the morning and at bedtime. [name], MD Taking Active   nitroGLYCERIN (NITROSTAT) 0.4 MG SL tablet [id number] No PLACE 1 TABLET (0.4 MG TOTAL) UNDER THE TONGUE EVERY 5 (FIVE) MINUTESAS NEEDED FOR CHEST PAIN (CHEST PAIN OR SHORTNESS OF BREATH).  Patient taking differently: Place 0.4 mg under the tongue every 5 (five) minutes as needed for chest pain (max 3 doses).   Burnell Blanks, MD Taking Active   oxyCODONE (OXY IR/ROXICODONE) 5 MG immediate release tablet [id number] No Take 0.5-1 tablets (2.5-5 mg total) by mouth every 6 (six) hours as needed for severe pain. For AFTER surgery only, do not take and drive  Patient not taking: Reported on 03/17/2021   Joylene John D, NP Not Taking Active   pantoprazole (PROTONIX) 40 MG tablet [id number] No TAKE 1 TABLET EVERY DAY [name], MD Taking Active   rosuvastatin (CRESTOR) 40 MG tablet [id number] No TAKE 1 TABLET EVERY DAY [name], MD Taking Active   senna-docusate (SENOKOT-S) 8.6-50 MG tablet [id number] No Take 2 tablets by mouth at bedtime. For AFTER surgery, do not take if having diarrhea Joylene John D, NP Taking Active   sertraline (ZOLOFT) 50 MG tablet [id number] No TAKE 2 TABLETS AT BEDTIME [name], MD Taking Active   spironolactone (ALDACTONE) 25 MG tablet [id number] No Take 1 tablet (25 mg total) by mouth daily.  Patient taking differently: Take 25 mg by mouth daily with breakfast.   [name], MD Taking Active   tiotropium [facility] HANDIHALER) 18 MCG inhalation capsule [id number] No Place 1 capsule (18 mcg total) into inhaler and  inhale daily.  Patient taking differently: Place 18 mcg into inhaler and inhale daily before breakfast.   [name], MD Taking Active   vitamin B-12 (CYANOCOBALAMIN) 1000 MCG tablet [id number] No Take 1,000 mcg by mouth daily with breakfast. [provider] Taking Active Family Member  vitamin E 400 UNIT capsule [id number] No Take 400 Units by mouth at bedtime. [provider] Taking Active Family Member            Patient Active Problem List   Diagnosis Date Noted   Epilepsy ([place]) 03/12/2021   Adnexal mass 11/24/2020   Thickened endometrium 11/24/2020   Dementia ([place]) 10/22/2020   Delirium 06/25/2020   Actinic keratosis 11/17/2017   Anemia, iron deficiency 10/23/2015   CKD (chronic kidney disease), stage III ([place]) 07/25/2014   Osteoarthritis, knee 05/13/2014   Anxiety state 04/21/2014   Chronic diastolic CHF (congestive heart failure) ([place]) 03/13/2014   SOB (shortness of breath) 03/05/2014   CAD- RCA PCI '90s, STEMI-RCA DES  02/18/14 03/05/2014   Back pain, lumbosacral 10/10/2012   Type 2 diabetes mellitus without complication, with long-term current use of insulin ([place]) 03/26/2007   Hyperlipidemia associated with type 2 diabetes mellitus ([place]) 03/26/2007   Essential hypertension 04/21/2006   COPD (chronic obstructive pulmonary disease) ([place]) 04/21/2006    Immunization History  Administered Date(s) Administered   Fluad Quad(high Dose 65+) 08/17/2020   Influenza Split 04/21/2011, 05/16/2012   Influenza Whole 05/24/2007, 05/01/2009, 04/17/2010   Influenza, High Dose Seasonal PF 03/23/2018, 05/25/2019   Influenza,inj,Quad PF,6+ Mos 04/14/2013, 04/10/2014, 04/03/2015   Influenza-Unspecified 06/01/2016, 06/15/2017, 05/24/2020   Moderna Sars-Covid-2 Vaccination 10/25/2019, 11/22/2019, 10/03/2020   Pneumococcal Conjugate-13 09/22/2014, 05/25/2019   Pneumococcal Polysaccharide-23 04/18/2003   Tdap 07/29/2011   Zoster, Live 04/18/2011    Conditions to be  addressed/monitored:  HTN, HLD, CHF, COPD, HLD, Dementia  There are no care plans that you recently modified to display for this patient.    Medication Assistance: None required.  Patient affirms current coverage meets needs.  Compliance/Adherence/Medication fill history: Care Gaps: Due for eye exam  Patient's preferred pharmacy is:  [facility] [id number] - [place], [address] [place] [address] Phone: [phone] Fax: [phone]  [facility] ([facility] - [place], [address] [place] [address] Phone: [phone] Fax: [phone]  Uses pill box? Yes Pt endorses 100% compliance  We discussed: Benefits of medication synchronization, packaging and delivery as well as enhanced pharmacist oversight with [facility]. Patient decided to: Continue current medication management strategy  Care Plan and Follow Up Patient Decision:  Patient agrees to Care Plan and Follow-up.  Plan: The care management team will reach out to the patient again over the next 180 days.  Beverly Milch, PharmD Clinical Pharmacist [phone]

## 2021-03-30 NOTE — Patient Instructions (Addendum)
Visit Information   Goals Addressed             This Visit's Progress    Monitor and Manage My Blood Sugar-Diabetes Type 2       Timeframe:  Long-Range Goal Priority:  High Start Date: 03/30/21                            Expected End Date:09/27/20                       Follow Up Date 06/28/21   - check blood sugar at prescribed times - check blood sugar if I feel it is too high or too low - take the blood sugar log to all doctor visits    Why is this important?   Checking your blood sugar at home helps to keep it from getting very high or very low.  Writing the results in a diary or log helps the doctor know how to care for you.  Your blood sugar log should have the time, date and the results.  Also, write down the amount of insulin or other medicine that you take.  Other information, like what you ate, exercise done and how you were feeling, will also be helpful.     Notes:        Patient Care Plan: General Pharmacy (Adult)     Problem Identified: HTN, HLD, CHF, COPD, HLD, Dementia   Priority: High  Onset Date: 03/30/2021     Long-Range Goal: Patient-Specific Goal   Start Date: 03/30/2021  Expected End Date: 09/27/2021  This Visit's Progress: On track  Priority: High  Note:   Current Barriers:  Productive cough throughout the day  Pharmacist Clinical Goal(s):  Patient will achieve control of cough as evidenced by symptom levels through collaboration with PharmD and provider.   Interventions: 1:1 collaboration with [name], MD regarding development and update of comprehensive plan of care as evidenced by provider attestation and co-signature Inter-disciplinary care team collaboration (see longitudinal plan of care) Comprehensive medication review performed; medication list updated in electronic medical record  Hypertension (BP goal <140/90) -Controlled -Current treatment: Enalapril '20mg'$  daily Amlodipine '10mg'$  daily Carvedilol 12.'5mg'$   daily Hydralazine '25mg'$  qam and '50mg'$  every afternoon and evening Spironolactone '25mg'$  daily -Medications previously tried: none noted  -Current home readings: 120-135/70s -Denies hypotensive/hypertensive symptoms -Educated on BP goals and benefits of medications for prevention of heart attack, stroke and kidney damage; Importance of home blood pressure monitoring; Symptoms of hypotension and importance of maintaining adequate hydration; -Counseled to monitor BP at home daily, document, and provide log at future appointments -BP seems to be well controlled, no symptoms -Recommended to continue current medication  Diabetes (A1c goal <8%) -Controlled -Current medications: Metformin XR '500mg'$  twice daily Lantus 18 units daily -Medications previously tried: glimepiride  -Current home glucose readings - denies any hypoglycemia -Denies hypoglycemic/hyperglycemic symptoms -Educated on A1c and blood sugar goals; Prevention and management of hypoglycemic episodes; -Counseled to check feet daily and get yearly eye exams -Recommended to continue current medication  Heart Failure (Goal: manage symptoms and prevent exacerbations) -Controlled -Last ejection fraction: 60-65%  -HF type: Diastolic -Current treatment: Carvedilol 12.'5mg'$  BID Furosemide '20mg'$  daily -Medications previously tried: none noted  -Current home BP/HR readings: "controlled' -Educated on Benefits of medications for managing symptoms and prolonging life Importance of weighing daily; if you gain more than 3 pounds in one day or 5 pounds in  one week, contact providers Importance of blood pressure control Patient mentions cough over the past few weeks that happens throughout the day.  Used to be able to control it with cough drops.  Describes it as occasional clear mucous that she coughs up.  She is using both inhalers as prescribed.  She wants to know what she could take to help the cough.   -Counseled on anticholinergic effects of  a lot of cold medications Will consult with PCP on next steps to help treat cough  Patient Goals/Self-Care Activities Patient will:  - take medications as prescribed check glucose daily, document, and provide at future appointments check blood pressure daily, document, and provide at future appointments  Follow Up Plan: The care management team will reach out to the patient again over the next 80 days.         Patient verbalizes understanding of instructions provided today and agrees to view in [place].  Telephone follow up appointment with pharmacy team member scheduled for: 6 months  Michelle [name], [place]

## 2021-04-01 ENCOUNTER — Telehealth: Payer: Self-pay

## 2021-04-01 ENCOUNTER — Other Ambulatory Visit: Payer: Self-pay

## 2021-04-01 MED ORDER — BLOOD GLUCOSE MONITOR KIT: PACK | 3 refills | Status: DC

## 2021-04-01 MED ORDER — SERTRALINE HCL 50 MG PO TABS: 100.0000 mg | ORAL_TABLET | Freq: Every day | ORAL | 2 refills | Status: DC

## 2021-04-01 NOTE — Telephone Encounter (Signed)
Refill has been sent.  °

## 2021-04-01 NOTE — Telephone Encounter (Signed)
Patients son ( randy) called stated patient is in need of a new blood sugar meter but we need to verify with [employer] what specific meter they will cover before we send in another rx in for her   Please advise son if we have any questions or issues.

## 2021-04-01 NOTE — Telephone Encounter (Signed)
.   Encourage patient to contact the pharmacy for refills or they can request refills through [facility]:  Please schedule appointment if longer than 1 year  NEXT APPOINTMENT DATE:  MEDICATION:sertraline (ZOLOFT) 50 MG tablet  Is the patient out of medication?   [facility] ([facility] - [place], [place]  Let patient know to contact pharmacy at the end of the day to make sure medication is ready.  Please notify patient to allow 48-72 hours to process  CLINICAL FILLS OUT ALL BELOW:   LAST REFILL:  QTY:  REFILL DATE:    OTHER COMMENTS:    Okay for refill?  Please advise

## 2021-04-01 NOTE — Telephone Encounter (Signed)
Previously prescribed meter refilled and sent to pharmacy.

## 2021-04-05 ENCOUNTER — Ambulatory Visit (HOSPITAL_COMMUNITY): Payer: Medicare HMO

## 2021-04-06 ENCOUNTER — Ambulatory Visit: Payer: Medicare HMO | Admitting: Neurology

## 2021-04-14 ENCOUNTER — Other Ambulatory Visit: Payer: Self-pay | Admitting: Family Medicine

## 2021-04-16 DIAGNOSIS — I5032 Chronic diastolic (congestive) heart failure: Secondary | ICD-10-CM

## 2021-04-16 DIAGNOSIS — I10 Essential (primary) hypertension: Secondary | ICD-10-CM | POA: Diagnosis not present

## 2021-04-16 DIAGNOSIS — E119 Type 2 diabetes mellitus without complications: Secondary | ICD-10-CM

## 2021-04-16 DIAGNOSIS — Z794 Long term (current) use of insulin: Secondary | ICD-10-CM

## 2021-04-19 ENCOUNTER — Ambulatory Visit (HOSPITAL_COMMUNITY): Payer: Medicare HMO

## 2021-05-03 ENCOUNTER — Ambulatory Visit (HOSPITAL_COMMUNITY): Payer: Medicare HMO

## 2021-05-05 ENCOUNTER — Other Ambulatory Visit: Payer: Self-pay | Admitting: Family Medicine

## 2021-05-11 ENCOUNTER — Other Ambulatory Visit: Payer: Self-pay | Admitting: Neurology

## 2021-05-13 ENCOUNTER — Ambulatory Visit (HOSPITAL_COMMUNITY)
Admission: RE | Admit: 2021-05-13 | Discharge: 2021-05-13 | Disposition: A | Discharge status: Home / Self Care | Payer: Medicare HMO | Source: Ambulatory Visit | Attending: Gynecologic Oncology | Admitting: Gynecologic Oncology

## 2021-05-13 ENCOUNTER — Other Ambulatory Visit: Payer: Self-pay

## 2021-05-13 DIAGNOSIS — N9489 Other specified conditions associated with female genital organs and menstrual cycle: Secondary | ICD-10-CM | POA: Insufficient documentation

## 2021-05-13 DIAGNOSIS — D259 Leiomyoma of uterus, unspecified: Secondary | ICD-10-CM | POA: Diagnosis not present

## 2021-05-13 DIAGNOSIS — N838 Other noninflammatory disorders of ovary, fallopian tube and broad ligament: Secondary | ICD-10-CM | POA: Diagnosis not present

## 2021-05-13 DIAGNOSIS — K5730 Diverticulosis of large intestine without perforation or abscess without bleeding: Secondary | ICD-10-CM | POA: Diagnosis not present

## 2021-05-13 DIAGNOSIS — N8500 Endometrial hyperplasia, unspecified: Secondary | ICD-10-CM | POA: Diagnosis not present

## 2021-05-13 IMAGING — MR MR PELVIS WO/W CM
20 series · 47 of 48 positions shown · IV contrast (gadavist)
Comparison: Pelvic ultrasound [DATE]

CLINICAL DATA: Evaluate pelvic mass.

EXAM:
MRI PELVIS WITHOUT AND WITH CONTRAST
TECHNIQUE: Multiplanar multisequence MR imaging of the pelvis was performed
both before and after administration of intravenous contrast.
CONTRAST:  9mL GADAVIST GADOBUTROL 1 MMOL/ML IV SOLN

[Series 3: T2 · coronal · 6.0mm · 1.56mm/px · 2 of 40 slices shown (1 of 4)]
[im 1/40]
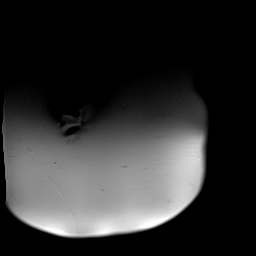
[im 40/40]
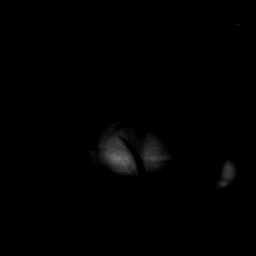

[Series 4: T2 · axial · 5.0mm · 0.51mm/px · 1 of 34 slices shown (2 of 4)]
[im 1/34]
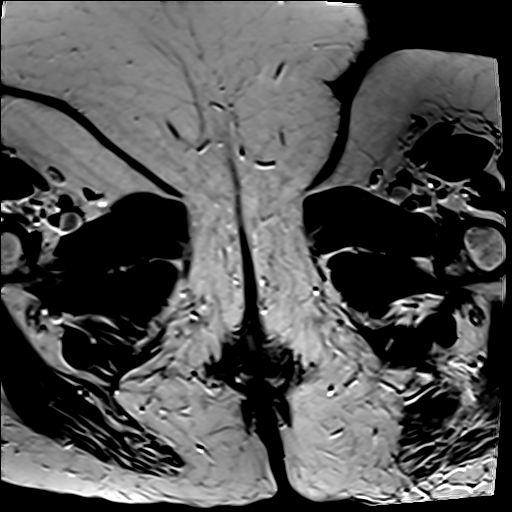

[Series 5: T2 · coronal · 4.0mm · 0.51mm/px · 2 of 49 slices shown (3 of 4)]
[im 1/49]
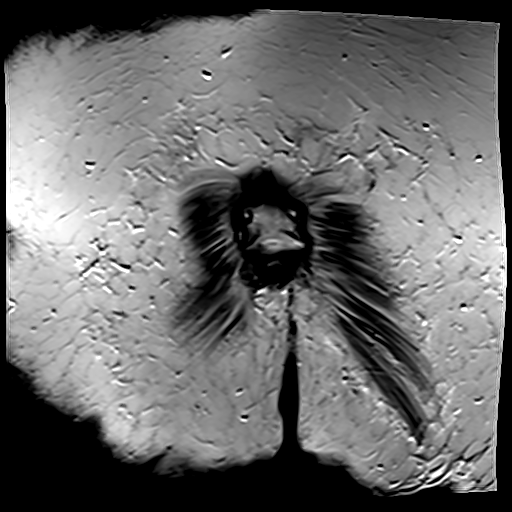
[im 49/49]
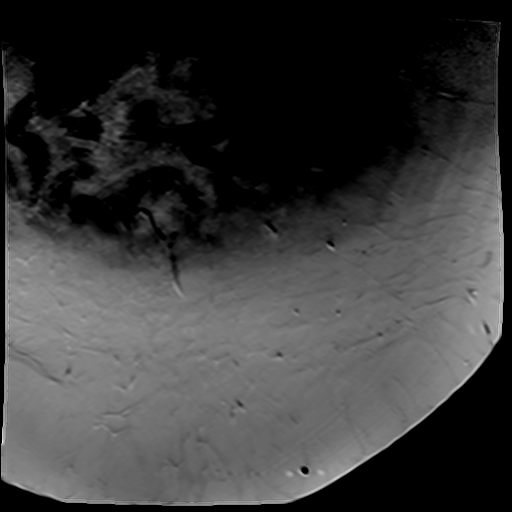

[Series 6: T2 fat-sat · axial · 5.0mm · 0.51mm/px · 1 of 40 slices shown]
[im 1/40]
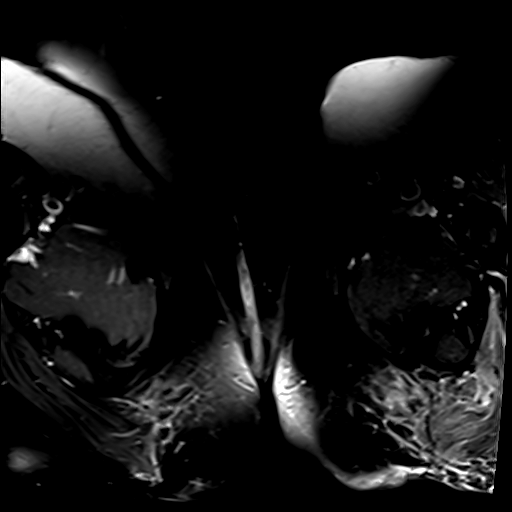

[Series 7: T2 · sagittal · 5.0mm · 0.55mm/px · 1 of 40 slices shown (4 of 4)]
[im 1/40]
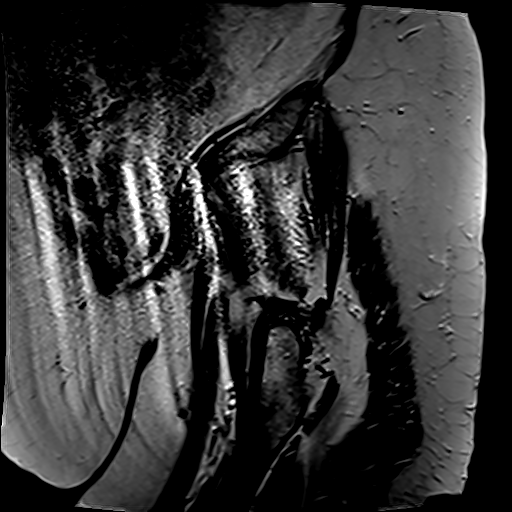

[Series 8: T1 · axial · 4.0mm · 0.86mm/px · z∈[-33,+251]mm · 2 of 72 slices shown (1 of 2)]
[im 1/72]
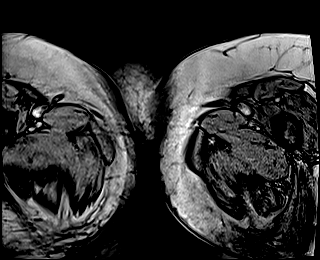
[im 72/72]
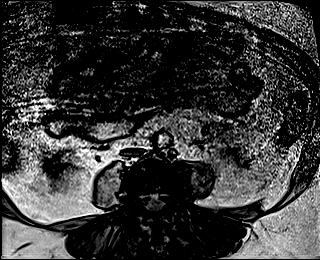

[Series 9: T1 · axial · 4.0mm · 0.86mm/px · z∈[-33,+251]mm · 2 of 72 slices shown (2 of 2)]
[im 1/72]
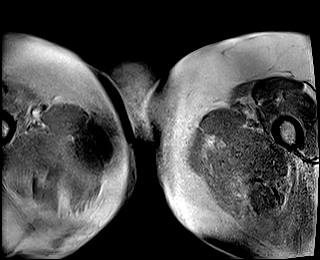
[im 72/72]
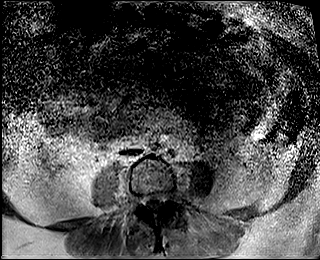

[Series 10: DWI · axial · 5.0mm · 2.80mm/px · z∈[-15,+224]mm · 5 of 142 slices shown (1 of 3)]
[im 1/142]
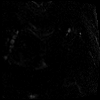
[im 36/142]
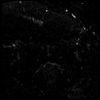
[im 71/142]
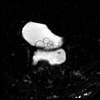
[im 106/142]
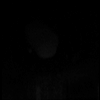
[im 142/142]
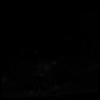

[Series 11: DWI · axial · 5.0mm · 2.80mm/px · z∈[-15,+224]mm · 2 of 49 slices shown (2 of 3)]
[im 1/49]
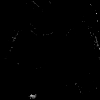
[im 49/49]
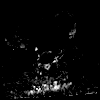

[Series 12: DWI · axial · 5.0mm · 2.80mm/px · z∈[-15,+224]mm · 2 of 49 slices shown (3 of 3)]
[im 1/49]
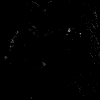
[im 49/49]
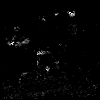

[Series 14: T1 dynamic · axial · 3.0mm · 0.84mm/px · z∈[-25,+235]mm · 3 of 88 slices shown (1 of 7)]
[im 1/88]
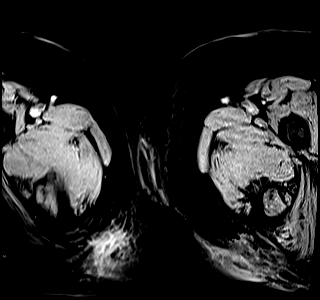
[im 44/88]
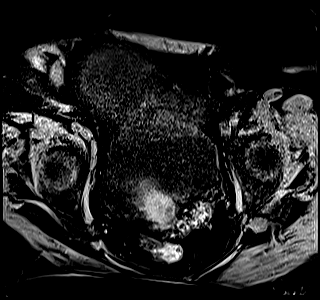
[im 88/88]
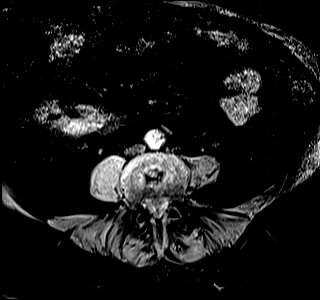

[Series 18: T1 dynamic · axial · 3.0mm · 0.84mm/px · z∈[-25,+235]mm · 3 of 88 slices shown (2 of 7)]
[im 1/88]
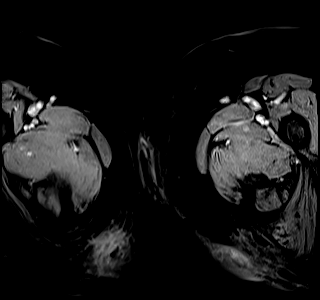
[im 44/88]
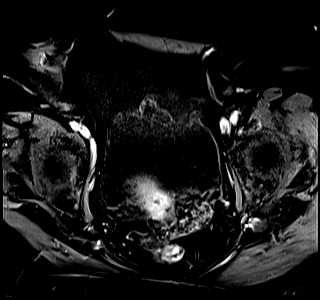
[im 88/88]
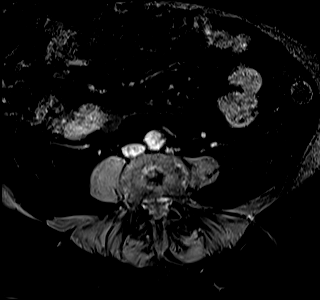

[Series 19: T1 dynamic · axial · 3.0mm · 0.84mm/px · z∈[-25,+235]mm · 3 of 88 slices shown (3 of 7)]
[im 1/88]
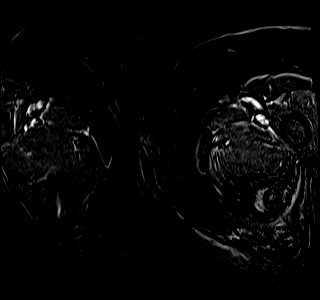
[im 44/88]
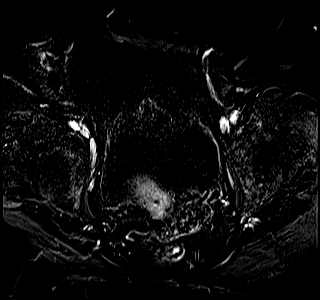
[im 88/88]
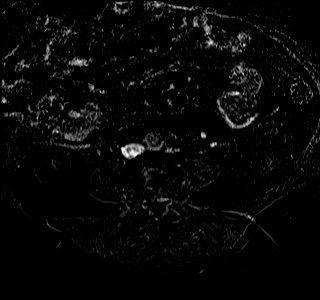

[Series 22: T1 dynamic · axial · 3.0mm · 0.84mm/px · z∈[-25,+235]mm · 3 of 88 slices shown (4 of 7)]
[im 1/88]
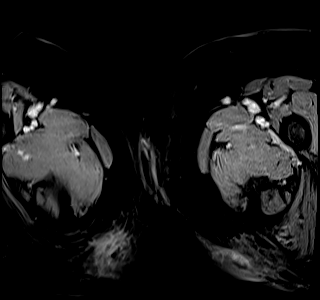
[im 44/88]
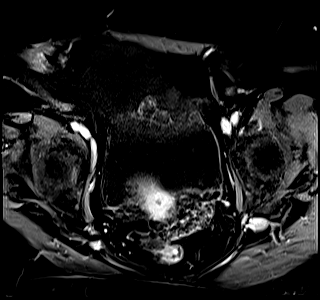
[im 88/88]
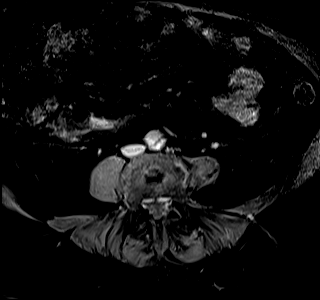

[Series 23: T1 dynamic · axial · 3.0mm · 0.84mm/px · z∈[-25,+235]mm · 3 of 88 slices shown (5 of 7)]
[im 1/88]
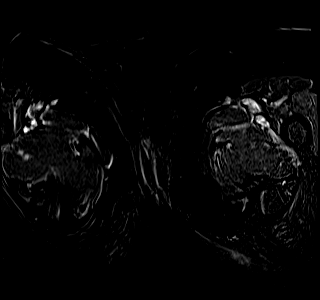
[im 44/88]
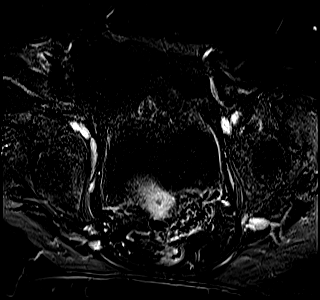
[im 88/88]
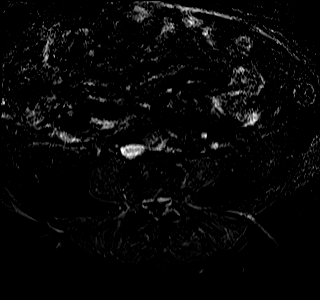

[Series 26: T1 dynamic · axial · 3.0mm · 0.84mm/px · z∈[-25,+235]mm · 3 of 88 slices shown (6 of 7)]
[im 1/88]
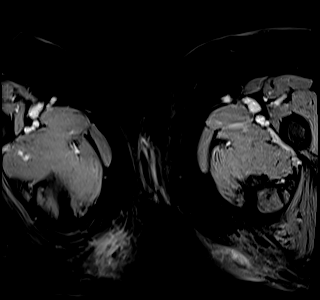
[im 44/88]
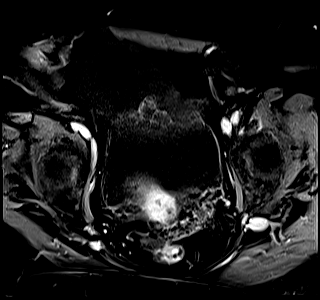
[im 88/88]
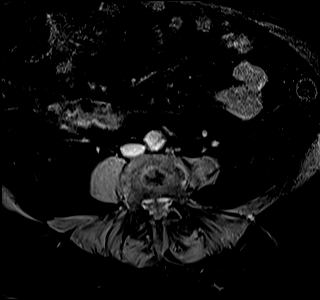

[Series 27: T1 dynamic · axial · 3.0mm · 0.84mm/px · z∈[-25,+235]mm · 3 of 88 slices shown (7 of 7)]
[im 1/88]
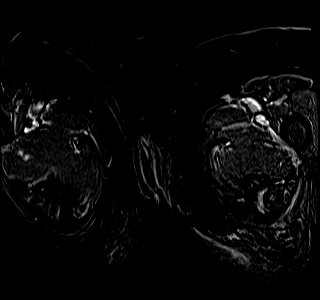
[im 44/88]
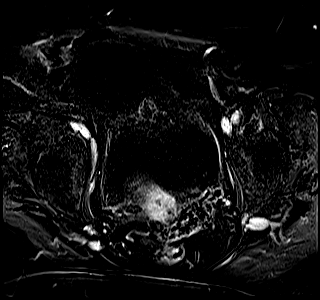
[im 88/88]
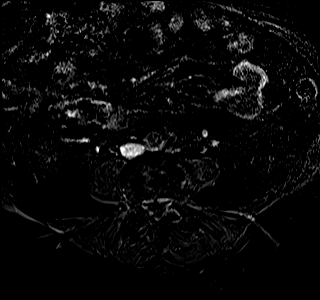

[Series 29: T1 dynamic post-contrast · axial · 3.0mm · 1.06mm/px · z∈[-22,+239]mm · 3 of 88 slices shown (1 of 2)]
[im 1/88]
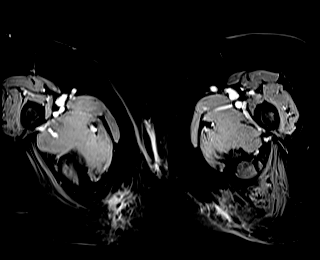
[im 44/88]
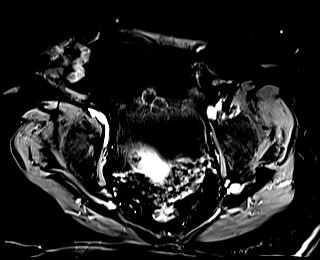
[im 88/88]
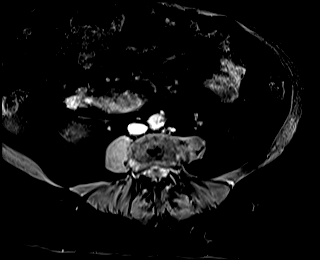

[Series 30: T2 post-contrast · sagittal · 5.0mm · 0.55mm/px · 1 of 40 slices shown]
[im 1/40]
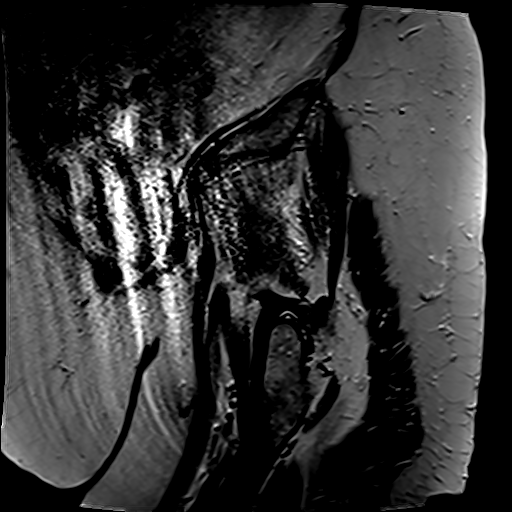

[Series 32: T1 dynamic post-contrast · sagittal · 3.0mm · 0.88mm/px · 2 of 76 slices shown (2 of 2)]
[im 1/76]
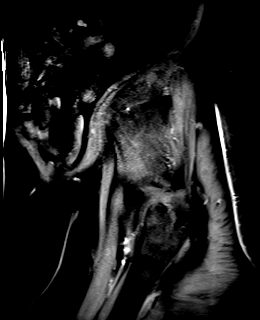
[im 38/76]
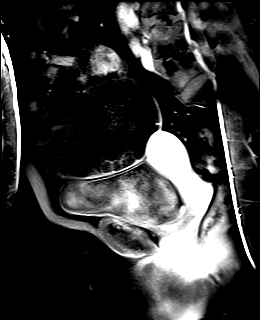

[47 of 48 positions shown; findings below may reference images not displayed]

FINDINGS: Urinary Tract: The bladder is unremarkable. No bladder lesions or
bladder calculi.

Bowel: The rectum, sigmoid and visualized small bowel loops are
unremarkable. Moderate colonic diverticulosis.

Vascular/Lymphatic: Normal pelvic vasculature. No aneurysm. No lower
retroperitoneal, pelvic or inguinal adenopathy.

Reproductive: The endometrium is abnormally thickened for age
measuring up to maximum of 17 mm. Irregular contrast enhancement is
demonstrated. Findings worrisome for endometrial cancer. Recommend
endometrial sampling. The junctional zone and myometrium appear
intact. Small myometrial fibroids are noted.

The right ovary appears normal.

There is a large cystic left adnexal mass which measures a maximum
13 x 11 x 8.5 cm. Numerous enhancing septations are seen within the
lesion. I do not see any solid enhancing nodules. Given its size and
the patient's age recommend gyn consultation and consideration for
surgical excision.

Other:  No free pelvic fluid collections.

Musculoskeletal: The bony structures are intact.
IMPRESSION: 1. 13 x 11 x 8.5 cm complex cystic left adnexal mass. Possible
serous cystadenoma but recommend gyn consultation consideration for
excision.
2. Abnormally thickened endometrium for age with heterogeneous
contrast enhancement. Findings suspicious for endometrial cancer.
Recommend endometrial sampling.
3. The right ovary is normal.
4. No pelvic adenopathy.

## 2021-05-13 MED ORDER — GADOBUTROL 1 MMOL/ML IV SOLN
9.0000 mL | Freq: Once | INTRAVENOUS | Status: AC | PRN
  Administered 2021-05-13: 9 mL via INTRAVENOUS

## 2021-05-17 ENCOUNTER — Telehealth: Payer: Self-pay | Admitting: Gynecologic Oncology

## 2021-05-17 NOTE — Telephone Encounter (Signed)
Called patient to discuss MRI results.  Spoke with both the patient as well as her son who is with her.  Reviewed findings from MRI including slight increase in size of mass that has some septations although not other significant findings concerning for malignancy.  She continues to have thickened endometrial lining.  Has previously undergone endometrial biopsy that did not show endometrial pathology.  Patient is very concerned still about surgery and has significant hesitations about proceeding with any discussion of surgery.  Family would like some time to think about options.  We discussed that we could move forward at this time with scheduling surgery which I would recommend as total hysterectomy and BSO.  I think if the patient would defer any sort of adjuvant treatment in the setting of cancer, that sending frozen section or doing additional procedures does not make a whole lot of sense.  We could also do close imaging surveillance to assess for any change to mass size or character.  The other option would not be to proceed with any additional imaging.  They voiced that they would call the clinic back in several days with any questions or to let me know what they would like to do in terms of moving forward.  Jeral Pinch MD Gynecologic Oncology

## 2021-06-07 ENCOUNTER — Other Ambulatory Visit: Payer: Self-pay | Admitting: Family Medicine

## 2021-06-07 ENCOUNTER — Ambulatory Visit (INDEPENDENT_AMBULATORY_CARE_PROVIDER_SITE_OTHER): Payer: Medicare HMO

## 2021-06-07 DIAGNOSIS — Z0000 Encounter for general adult medical examination without abnormal findings: Secondary | ICD-10-CM

## 2021-06-07 DIAGNOSIS — Z7409 Other reduced mobility: Secondary | ICD-10-CM | POA: Diagnosis not present

## 2021-06-07 NOTE — Progress Notes (Addendum)
Virtual Visit via Telephone Note  I connected with  Michelle [name] on 06/07/21 at  2:30 PM EST by telephone and verified that I am speaking with the correct person using two identifiers.  Medicare Annual Wellness visit completed telephonically due to Covid-19 pandemic.   Persons participating in this call: This Health Coach and this patient.   Location: Patient: Home Provider: Office   I discussed the limitations, risks, security and privacy concerns of performing an evaluation and management service by telephone and the availability of in person appointments. The patient expressed understanding and agreed to proceed.  Unable to perform video visit due to video visit attempted and failed and/or patient does not have video capability.   Some vital signs may be absent or patient reported.   Willette Brace, LPN   Subjective:   Michelle [name] is a 87 y.o. female who presents for Medicare Annual (Subsequent) preventive examination.  Review of Systems     Cardiac Risk Factors include: advanced age (>60mn, >>42women);obesity (BMI >30kg/m2);diabetes mellitus;dyslipidemia;hypertension;sedentary lifestyle     Objective:    There were no vitals filed for this visit. There is no height or weight on file to calculate BMI.  Advanced Directives 06/07/2021 02/02/2021 11/29/2020 09/22/2020 05/25/2020 05/20/2019 05/16/2018  Does Patient Have a Medical Advance Directive? No Yes Yes No No Yes No  Type of Advance Directive - [place]  Does patient want to make changes to medical advance directive? - - No - Patient declined - - No - Patient declined -  [place]? - - - - - No - copy requested -  Would patient like information on creating a medical advance directive? No - Patient declined - - - Yes (MAU/Ambulatory/Procedural Areas - Information given) - No - Patient declined   Pre-existing out of facility DNR order (yellow form or pink MOST form) - - - - - - -    Current Medications (verified) Outpatient Encounter Medications as of 06/07/2021  Medication Sig   Accu-Chek FastClix Lancets MISC Use to test blood sugars [name]. Dx: E11.9   ACCU-CHEK SMARTVIEW test strip TEST BLOOD SUGAR THREE TIMES [name]   acetaminophen (TYLENOL) 650 MG CR tablet Take 1,300 mg by mouth 3 (three) times [name]. Patient take 3 times a day.   albuterol (PROVENTIL) (2.5 MG/3ML) 0.083% nebulizer solution USE THREE MILLILITERS VIA NEBULIZATION BY MOUTH EVERY 6 HOURS AS NEEDED FOR WHEEZING OR SHORTNESS OF BREATH (Patient taking differently: Take 2.5 mg by nebulization every 6 (six) hours as needed for wheezing or shortness of breath.)   albuterol (VENTOLIN HFA) 108 (90 Base) MCG/ACT inhaler INHALE 1 PUFF EVERY 4 HOURS AS NEEDED FOR WHEEZING, SHORTNESS OF BREATH (RESCUE INHALER IF ADVAIR NOT WORKING)   Alcohol Swabs (B-D SINGLE USE SWABS REGULAR) PADS USE  FOR  TESTING THREE TIMES [name]   amLODipine (NORVASC) 10 MG tablet TAKE 1 TABLET EVERY DAY   aspirin 81 MG tablet Take 81 mg by mouth [name] with breakfast.   Blood Glucose Calibration (ACCU-CHEK SMARTVIEW CONTROL) LIQD    blood glucose meter kit and supplies KIT Dispense based on patient and insurance preference. Use up to four times [name] as directed. Dx:E11.9   calcium carbonate (OSCAL) 1500 (600 Ca) MG TABS tablet Take 600 mg of elemental calcium by mouth [name] with breakfast.   cholecalciferol (VITAMIN D3) 25 MCG (1000 UNIT) tablet Take 1,000 Units by  mouth [name] with breakfast.   clopidogrel (PLAVIX) 75 MG tablet TAKE 1 TABLET EVERY DAY   diclofenac Sodium (VOLTAREN) 1 % GEL APPLY 2 GRAMS TOPICALLY TO AFFECTED AREA 4 TIMES [name]   DROPLET PEN NEEDLES 31G X 8 MM MISC USE AS DIRECTED WITH LANTUS SOLOSTAR PEN   enalapril (VASOTEC) 20 MG tablet TAKE 1 TABLET TWICE [name] (Patient taking differently: Take 20 mg by mouth 2 (two) times [name].)    fenofibrate micronized (LOFIBRA) 134 MG capsule Take 1 capsule (134 mg total) by mouth [name].   ferrous sulfate 325 (65 FE) MG tablet Twice a week (Patient taking differently: Take 325 mg by mouth 2 (two) times a week.)   Fluticasone-Salmeterol (ADVAIR) 250-50 MCG/DOSE AEPB INHALE 1 PUFF TWICE [name] (Patient taking differently: Inhale 1 puff into the lungs in the morning and at bedtime.)   folic acid (FOLVITE) 045 MCG tablet Take 400 mcg by mouth [name] with breakfast.   hydrALAZINE (APRESOLINE) 25 MG tablet TAKE ONE TABLET BY MOUTH EVERY MORNING AS NEEDED FOR HIGH BLOOD PRESSURE   hydrALAZINE (APRESOLINE) 50 MG tablet Take 1 tablet (50 mg total) by mouth in the morning and at bedtime.   lamoTRIgine (LAMICTAL) 25 MG tablet TAKE ONE TABLET BY MOUTH AT BEDTIME (Patient taking differently: 2 (two) times [name].)   Lancets Misc. (ACCU-CHEK FASTCLIX LANCET) KIT 1 Device by Does not apply route. Use as directed for testing blood sugar   Lancets Misc. (ACCU-CHEK FASTCLIX LANCET) KIT Use as directed to test blood sugar   LANTUS SOLOSTAR 100 UNIT/ML Solostar Pen INJECT  17  TO  20 UNITS SUBCUTANEOUSLY EVERY MORNING (Patient taking differently: Inject 18 Units into the skin 2 (two) times [name].)   loratadine (CLARITIN) 10 MG tablet Take 10 mg by mouth every other day.   Melatonin 5 MG CAPS Take 5 mg by mouth at bedtime.   metFORMIN (GLUCOPHAGE-XR) 500 MG 24 hr tablet Take 1 tablet (500 mg total) by mouth in the morning and at bedtime.   nitroGLYCERIN (NITROSTAT) 0.4 MG SL tablet PLACE 1 TABLET (0.4 MG TOTAL) UNDER THE TONGUE EVERY 5 (FIVE) MINUTESAS NEEDED FOR CHEST PAIN (CHEST PAIN OR SHORTNESS OF BREATH). (Patient taking differently: Place 0.4 mg under the tongue every 5 (five) minutes as needed for chest pain (max 3 doses).)   pantoprazole (PROTONIX) 40 MG tablet TAKE 1 TABLET EVERY DAY   rosuvastatin (CRESTOR) 40 MG tablet TAKE 1 TABLET EVERY DAY   sertraline (ZOLOFT) 50 MG tablet Take 2 tablets (100 mg  total) by mouth at bedtime.   SPIRIVA HANDIHALER 18 MCG inhalation capsule PLACE 1 CAPSULE INTO HANDIHALER AND INHALE [name]   spironolactone (ALDACTONE) 25 MG tablet Take 1 tablet (25 mg total) by mouth [name]. (Patient taking differently: Take 25 mg by mouth [name] with breakfast.)   vitamin B-12 (CYANOCOBALAMIN) 1000 MCG tablet Take 1,000 mcg by mouth [name] with breakfast.   vitamin E 400 UNIT capsule Take 400 Units by mouth at bedtime.   carvedilol (COREG) 12.5 MG tablet Take 1 tablet (12.5 mg total) by mouth 2 (two) times [name].   furosemide (LASIX) 20 MG tablet Take 1 tablet (20 mg total) by mouth [name].   icosapent Ethyl (VASCEPA) 1 g capsule Take 2 capsules (2 g total) by mouth 2 (two) times [name]. (Patient not taking: Reported on 06/07/2021)   senna-docusate (SENOKOT-S) 8.6-50 MG tablet Take 2 tablets by mouth at bedtime. For AFTER surgery, do not take if having diarrhea (Patient not taking: Reported on 06/07/2021)   [  DISCONTINUED] oxyCODONE (OXY IR/ROXICODONE) 5 MG immediate release tablet Take 0.5-1 tablets (2.5-5 mg total) by mouth every 6 (six) hours as needed for severe pain. For AFTER surgery only, do not take and drive (Patient not taking: Reported on 06/07/2021)   No facility-administered encounter medications on file as of 06/07/2021.    Allergies (verified) Codeine sulfate and Morphine sulfate   History: Past Medical History:  Diagnosis Date   Anginal pain (HCC)    Arthritis    AV block, 1st degree    CAD (coronary artery disease) 1990; 2015   Cardiac cath 1990 with Dr. Lia Foyer and [name] Residual CAD, non obstructive per 2015 cath. STEMI status post stent in August 2015.   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   Promus DES to RCA with STEMI   Shortness of breath    Past Surgical History:  Procedure Laterality Date   CATARACT EXTRACTION  2020   right and left eye    CHOLECYSTECTOMY     thinks her appendix was removed at the same time   [place]  02/18/14   Promus DES to RCA   [place] N/A 02/18/2014   Procedure: [place];  Surgeon: Jettie Booze, MD;  Location: [facility] CATH LAB;  Service: Cardiovascular;  Laterality: N/A;   PTCA  1990   PTCA of RCA   TRANSTHORACIC ECHOCARDIOGRAM  02/02/2012   mild LVH, EF 55-60%, Normal WM, Gr 1 DD; Mild MR   Family History  Problem Relation Age of Onset   Heart attack Mother 53   Cancer Father 2   Cancer Maternal Grandfather    Cancer Paternal Grandmother    Cancer Paternal Grandfather    Diabetes Son    Liver cancer Brother    Bone cancer Brother    Heart attack Son    Hypertension Son    Diabetes Son    Hyperlipidemia Son    Colon cancer Neg Hx    Ovarian cancer Neg Hx    Uterine cancer Neg Hx    Social History   Socioeconomic History   Marital status: Single    Spouse name: Not on file   Number of children: 2   Years of education: Not on file   Highest education level: Not on file  Occupational History   Occupation: Retired    Fish farm manager: RETIRED  Tobacco Use   Smoking status: Former    Packs/day: 1.00    Years: 55.00    Pack years: 55.00    Types: Cigarettes    Quit date: 09/29/2006    Years since quitting: 14.6   Smokeless tobacco: Never  Vaping Use   Vaping Use: Never used  Substance and Sexual Activity   Alcohol use: No   Drug use: No   Sexual activity: Not Currently  Other Topics Concern   Not on file   Social History Narrative   Lives with her son but he works all day. Home  for dinner.  Independent ADL. Needs assist on some IADL. Right handed    Social Determinants of Health   Financial Resource Strain: Low Risk    Difficulty of Paying Living Expenses: Not hard at all  Food Insecurity: No Food Insecurity   Worried About Charity fundraiser in the Last Year: Never true   Ran Out of Food in the Last Year: Never true  Transportation Needs: No Transportation Needs   Lack of Transportation (Medical): No   Lack of Transportation (Non-Medical): No  Physical Activity: Inactive   Days of Exercise per Week: 0 days   Minutes of Exercise per Session: 0 min  Stress: No Stress Concern Present   Feeling of Stress : Not at all  Social Connections: Socially Isolated   Frequency of Communication with Friends and Family: Once a week   Frequency of Social Gatherings with Friends and Family: Once a week   Attends Religious Services: Never   Marine scientist or Organizations: No   Attends Music therapist: Never   Marital Status: Never married    Tobacco Counseling Counseling given: Not Answered   Clinical Intake:  Pre-visit preparation completed: Yes  Pain : No/denies pain     BMI - recorded: 31.42 Nutritional Status: BMI > 30  Obese Nutritional Risks: None Diabetes: Yes CBG done?: Yes (117) CBG resulted in Enter/ Edit results?: No Did pt. bring in CBG monitor from home?: No  How often do you need to have someone help you when you read instructions, pamphlets, or other written materials from your doctor or pharmacy?: 1 - Never  Diabetic?Nutrition Risk Assessment:  Has the patient had any N/V/D within the last 2 months?  Yes loose stools Does the patient have any non-healing wounds?  No  Has the patient had any unintentional weight loss or weight gain?  No   Diabetes:  Is the patient diabetic?  Yes  If diabetic, was a CBG obtained today?  Yes  Did the  patient bring in their glucometer from home?  No  How often do you monitor your CBG's? [name] .   Financial Strains and Diabetes Management:  Are you having any financial strains with the device, your supplies or your medication? No .  Does the patient want to be seen by Chronic Care Management for management of their diabetes?  No  Would the patient like to be referred to a Nutritionist or for Diabetic Management?  No   Diabetic Exams:  Diabetic Eye Exam: Overdue for diabetic eye exam. Pt has been advised about the importance in completing this exam. Patient advised to call and schedule an eye exam. Diabetic Foot Exam: Completed 07/30/20   Interpreter Needed?: No  Information entered by :: Charlott Rakes, LPN   Activities of [name] Living In your present state of health, do you have any difficulty performing the following activities: 06/07/2021  Hearing? N  Vision? N  Difficulty concentrating or making decisions? N  Comment at times  Walking or climbing stairs? Y  Comment unable to walk  Dressing or bathing? N  Doing errands, shopping? N  Preparing Food and eating ? Y  Comment son randy  In the past six months, have you accidently leaked urine? Y  Comment wears a pad  Do you have problems with loss of bowel control? Y  Comment wears a pad  Managing your Medications? Y  Comment son randy  Managing your Finances? N  Housekeeping or managing your Housekeeping? Darreld Mclean  Comment son randy  Some recent data might be hidden    Patient Care Team: [name], MD as PCP - General (Family Medicine) Burnell Blanks, MD as PCP - Cardiology (Cardiology) Karin Golden, MD as Consulting Physician (Dermatology) Bernarda Caffey, MD as Consulting Physician (Ophthalmology) [name], MD as Consulting Physician (Neurology) Elease Hashimoto (Neurology) Edythe Clarity, [facility] (Pharmacist)  Indicate any recent Medical Services you may have received from other than [facility]  providers in the past year (date may be approximate).     Assessment:   This is a routine wellness examination for Malaijah.  Hearing/Vision screen Hearing Screening - Comments:: Pt denies any hearing issues  Vision Screening - Comments:: Pt encouraged to follow up with provider   Dietary issues and exercise activities discussed: Current Exercise Habits: The patient does not participate in regular exercise at present   Goals Addressed             This Visit's Progress    Patient Stated       Get rid of some medications        Depression Screen PHQ 2/9 Scores 06/07/2021 03/12/2021 01/14/2021 10/22/2020 09/04/2020 05/25/2020 02/18/2020  PHQ - 2 Score 0 0 0 0 3 0 0  PHQ- 9 Score - - - 2 13 - 3    Fall Risk Fall Risk  06/07/2021 03/12/2021 02/02/2021 01/14/2021 09/22/2020  Falls in the past year? 1 0 _0 Number falls in past yr: 1 0 _1 Injury with Fall? 1 0 1 0 0  Comment bruised - - - -  Risk for fall due to : Impaired mobility;Impaired vision No Fall Risks - History of fall(s) Impaired mobility;Impaired balance/gait  Follow up Falls prevention discussed - - Falls evaluation completed -    FALL RISK PREVENTION PERTAINING TO THE HOME:  Any stairs in or around the home? Yes  If so, are there any without handrails? No  Home free of loose throw rugs in walkways, pet beds, electrical cords, etc? Yes  Adequate lighting in your home to reduce risk of falls? Yes   ASSISTIVE DEVICES UTILIZED TO PREVENT FALLS:  Life alert? No  Use of a cane, walker or w/c? Yes  Grab bars in the bathroom? No  Shower chair or bench in shower? Yes  Elevated toilet seat or a handicapped toilet? Yes   TIMED UP AND GO:  Was the test performed? No .   Cognitive Function: MMSE - Mini Mental State Exam 02/02/2021 04/14/2017  Not completed: - (No Data)  Orientation to time 4 -  Orientation to Place 4 -  Registration 3 -  Attention/ Calculation 0 -  Recall 0 -  Language- name 2 objects 2 -   Language- repeat 1 -  Language- follow 3 step command 3 -  Language- read & follow direction 1 -  Write a sentence 1 -  Copy design 1 -  Total score 20 -   Montreal Cognitive Assessment  09/22/2020  Visuospatial/ Executive (0/5) 1  Naming (0/3) 2  Attention: Read list of digits (0/2) 2  Attention: Read list of letters (0/1) 0  Attention: Serial 7 subtraction starting at 100 (0/3) 1  Language: Repeat phrase (0/2) 1  Language : Fluency (0/1) 0  Abstraction (0/2) 0  Delayed Recall (0/5) 0  Orientation (0/6) 6  Total 13  Adjusted Score (based on education) 14   6CIT Screen 06/07/2021 05/25/2020 05/20/2019 05/16/2018  What Year?  0 points 0 points 0 points 0 points  What month? 0 points 0 points 0 points 0 points  What time? 0 points - 0 points 0 points  Count back from 20 0 points 0 points 0 points 0 points  Months in reverse 4 points 4 points 0 points 0 points  Repeat phrase 10 points 10 points 2 points 0 points  Total Score 14 - 2 0    Immunizations Immunization History  Administered Date(s) Administered   Fluad Quad(high Dose 65+) 08/17/2020   Influenza Split 04/21/2011, 05/16/2012   Influenza Whole 05/24/2007, 05/01/2009, 04/17/2010   Influenza, High Dose Seasonal PF 03/23/2018, 05/25/2019   Influenza,inj,Quad PF,6+ Mos 04/14/2013, 04/10/2014, 04/03/2015   Influenza-Unspecified 06/01/2016, 06/15/2017, 05/24/2020   Moderna Sars-Covid-2 Vaccination 10/25/2019, 11/22/2019, 10/03/2020   Pneumococcal Conjugate-13 09/22/2014, 05/25/2019   Pneumococcal Polysaccharide-23 04/18/2003   Tdap 07/29/2011   Zoster, Live 04/18/2011    TDAP status: Up to date  Flu Vaccine status: Due, Education has been provided regarding the importance of this vaccine. Advised may receive this vaccine at local pharmacy or Health Dept. Aware to provide a copy of the vaccination record if obtained from local pharmacy or Health Dept. Verbalized acceptance and understanding.  Pneumococcal vaccine status:  Up to date  Covid-19 vaccine status: Completed vaccines  Qualifies for Shingles Vaccine? Yes   Zostavax completed No   Shingrix Completed?: No.    Education has been provided regarding the importance of this vaccine. Patient has been advised to call insurance company to determine out of pocket expense if they have not yet received this vaccine. Advised may also receive vaccine at local pharmacy or Health Dept. Verbalized acceptance and understanding.  Screening Tests Health Maintenance  Topic Date Due   Zoster Vaccines- Shingrix (1 of 2) Never done   OPHTHALMOLOGY EXAM  04/28/2018   COVID-19 Vaccine (4 - Booster for Moderna series) 11/28/2020   INFLUENZA VACCINE  02/15/2021   DEXA SCAN  04/21/2043 (Originally 12/31/1999)   HEMOGLOBIN A1C  07/16/2021   TETANUS/TDAP  07/28/2021   FOOT EXAM  07/30/2021   Pneumonia Vaccine 68+ Years old  Completed   HPV VACCINES  Aged Out    Health Maintenance  Health Maintenance Due  Topic Date Due   Zoster Vaccines- Shingrix (1 of 2) Never done   OPHTHALMOLOGY EXAM  04/28/2018   COVID-19 Vaccine (4 - Booster for Moderna series) 11/28/2020   INFLUENZA VACCINE  02/15/2021    Colorectal cancer screening: No longer required.   Mammogram status: No longer required due to age.    Additional Screening:   Vision Screening: Recommended annual ophthalmology exams for early detection of glaucoma and other disorders of the eye. Is the patient up to date with their annual eye exam?  No  Who is the provider or what is the name of the office in which the patient attends annual eye exams? Encouraged to follow up If pt is not established with a provider, would they like to be referred to a provider to establish care? No .   Dental Screening: Recommended annual dental exams for proper oral hygiene  Community Resource Referral / Chronic Care Management: CRR required this visit?  Yes   CCM required this visit?  No      Plan:     I have personally  reviewed and noted the following in the patient's chart:   Medical and social history Use of alcohol, tobacco or illicit drugs  Current medications and supplements including opioid prescriptions.  Functional  ability and status Nutritional status Physical activity Advanced directives List of other physicians Hospitalizations, surgeries, and ER visits in previous 12 months Vitals Screenings to include cognitive, depression, and falls Referrals and appointments  In addition, I have reviewed and discussed with patient certain preventive protocols, quality metrics, and best practice recommendations. A written personalized care plan for preventive services as well as general preventive health recommendations were provided to patient.     Willette Brace, LPN   [id number]   Nurse Notes: Spoke with Pt and son [name], about any therapy he stated she ran out of insurance and he can't get her motivated to do any exercise. Pt stated she is in too much pain to exercise. I requested resources to aid in a ramp for transfers. Pt also stated she has a very old walker. Pt has appt 06/15/21 @ 3 p.m

## 2021-06-07 NOTE — Patient Instructions (Signed)
Michelle [name] , Thank you for taking time to come for your Medicare Wellness Visit. I appreciate your ongoing commitment to your health goals. Please review the following plan we discussed and let me know if I can assist you in the future.   Screening recommendations/referrals: Colonoscopy: No longer required  Mammogram: No longer required  Recommended yearly ophthalmology/optometry visit for glaucoma screening and checkup Recommended yearly dental visit for hygiene and checkup  Vaccinations: Influenza vaccine: Due and discussed Pneumococcal vaccine: Up to date Tdap vaccine: Done 07/29/11 repeat every 10 years  Shingles vaccine: Shingrix discussed. Please contact your pharmacy for coverage information.    Covid-19:Completed 4/9, 5/7, & 10/03/20  Advanced directives: Advance directive discussed with you today. Even though you declined this today please call our office should you change your mind and we can give you the proper paperwork for you to fill out.  Conditions/risks identified: get rid of some medications   Next appointment: Follow up in one year for your annual wellness visit    Preventive Care 65 Years and Older, Female Preventive care refers to lifestyle choices and visits with your health care provider that can promote health and wellness. What does preventive care include? A yearly physical exam. This is also called an annual well check. Dental exams once or twice a year. Routine eye exams. Ask your health care provider how often you should have your eyes checked. Personal lifestyle choices, including: Daily care of your teeth and gums. Regular physical activity. Eating a healthy diet. Avoiding tobacco and drug use. Limiting alcohol use. Practicing safe sex. Taking low-dose aspirin every day. Taking vitamin and mineral supplements as recommended by your health care provider. What happens during an annual well check? The services and screenings done by your health care  provider during your annual well check will depend on your age, overall health, lifestyle risk factors, and family history of disease. Counseling  Your health care provider may ask you questions about your: Alcohol use. Tobacco use. Drug use. Emotional well-being. Home and relationship well-being. Sexual activity. Eating habits. History of falls. Memory and ability to understand (cognition). Work and work Statistician. Reproductive health. Screening  You may have the following tests or measurements: Height, weight, and BMI. Blood pressure. Lipid and cholesterol levels. These may be checked every 5 years, or more frequently if you are over 29 years old. Skin check. Lung cancer screening. You may have this screening every year starting at age 24 if you have a 30-pack-year history of smoking and currently smoke or have quit within the past 15 years. Fecal occult blood test (FOBT) of the stool. You may have this test every year starting at age 79. Flexible sigmoidoscopy or colonoscopy. You may have a sigmoidoscopy every 5 years or a colonoscopy every 10 years starting at age 65. Hepatitis C blood test. Hepatitis B blood test. Sexually transmitted disease (STD) testing. Diabetes screening. This is done by checking your blood sugar (glucose) after you have not eaten for a while (fasting). You may have this done every 1-3 years. Bone density scan. This is done to screen for osteoporosis. You may have this done starting at age 75. Mammogram. This may be done every 1-2 years. Talk to your health care provider about how often you should have regular mammograms. Talk with your health care provider about your test results, treatment options, and if necessary, the need for more tests. Vaccines  Your health care provider may recommend certain vaccines, such as: Influenza vaccine. This is  recommended every year. Tetanus, diphtheria, and acellular pertussis (Tdap, Td) vaccine. You may need a Td  booster every 10 years. Zoster vaccine. You may need this after age 38. Pneumococcal 13-valent conjugate (PCV13) vaccine. One dose is recommended after age 74. Pneumococcal polysaccharide (PPSV23) vaccine. One dose is recommended after age 39. Talk to your health care provider about which screenings and vaccines you need and how often you need them. This information is not intended to replace advice given to you by your health care provider. Make sure you discuss any questions you have with your health care provider. Document Released: 07/31/2015 Document Revised: 03/23/2016 Document Reviewed: 05/05/2015 Elsevier Interactive Patient Education  2017 [employer] Prevention in the Home Falls can cause injuries. They can happen to people of all ages. There are many things you can do to make your home safe and to help prevent falls. What can I do on the outside of my home? Regularly fix the edges of walkways and driveways and fix any cracks. Remove anything that might make you trip as you walk through a door, such as a raised step or threshold. Trim any bushes or trees on the path to your home. Use bright outdoor lighting. Clear any walking paths of anything that might make someone trip, such as rocks or tools. Regularly check to see if handrails are loose or broken. Make sure that both sides of any steps have handrails. Any raised decks and porches should have guardrails on the edges. Have any leaves, snow, or ice cleared regularly. Use sand or salt on walking paths during winter. Clean up any spills in your garage right away. This includes oil or grease spills. What can I do in the bathroom? Use night lights. Install grab bars by the toilet and in the tub and shower. Do not use towel bars as grab bars. Use non-skid mats or decals in the tub or shower. If you need to sit down in the shower, use a plastic, non-slip stool. Keep the floor dry. Clean up any water that spills on the floor  as soon as it happens. Remove soap buildup in the tub or shower regularly. Attach bath mats securely with double-sided non-slip rug tape. Do not have throw rugs and other things on the floor that can make you trip. What can I do in the bedroom? Use night lights. Make sure that you have a light by your bed that is easy to reach. Do not use any sheets or blankets that are too big for your bed. They should not hang down onto the floor. Have a firm chair that has side arms. You can use this for support while you get dressed. Do not have throw rugs and other things on the floor that can make you trip. What can I do in the kitchen? Clean up any spills right away. Avoid walking on wet floors. Keep items that you use a lot in easy-to-reach places. If you need to reach something above you, use a strong step stool that has a grab bar. Keep electrical cords out of the way. Do not use floor polish or wax that makes floors slippery. If you must use wax, use non-skid floor wax. Do not have throw rugs and other things on the floor that can make you trip. What can I do with my stairs? Do not leave any items on the stairs. Make sure that there are handrails on both sides of the stairs and use them. Fix handrails that are  broken or loose. Make sure that handrails are as long as the stairways. Check any carpeting to make sure that it is firmly attached to the stairs. Fix any carpet that is loose or worn. Avoid having throw rugs at the top or bottom of the stairs. If you do have throw rugs, attach them to the floor with carpet tape. Make sure that you have a light switch at the top of the stairs and the bottom of the stairs. If you do not have them, ask someone to add them for you. What else can I do to help prevent falls? Wear shoes that: Do not have high heels. Have rubber bottoms. Are comfortable and fit you well. Are closed at the toe. Do not wear sandals. If you use a stepladder: Make sure that it is  fully opened. Do not climb a closed stepladder. Make sure that both sides of the stepladder are locked into place. Ask someone to hold it for you, if possible. Clearly mark and make sure that you can see: Any grab bars or handrails. First and last steps. Where the edge of each step is. Use tools that help you move around (mobility aids) if they are needed. These include: Canes. Walkers. Scooters. Crutches. Turn on the lights when you go into a dark area. Replace any light bulbs as soon as they burn out. Set up your furniture so you have a clear path. Avoid moving your furniture around. If any of your floors are uneven, fix them. If there are any pets around you, be aware of where they are. Review your medicines with your doctor. Some medicines can make you feel dizzy. This can increase your chance of falling. Ask your doctor what other things that you can do to help prevent falls. This information is not intended to replace advice given to you by your health care provider. Make sure you discuss any questions you have with your health care provider. Document Released: 04/30/2009 Document Revised: 12/10/2015 Document Reviewed: 08/08/2014 Elsevier Interactive Patient Education  2017 [employer].

## 2021-06-10 ENCOUNTER — Other Ambulatory Visit: Payer: Self-pay | Admitting: Cardiovascular Disease

## 2021-06-14 ENCOUNTER — Telehealth: Payer: Self-pay

## 2021-06-14 NOTE — Telephone Encounter (Signed)
   Telephone encounter was:  Unsuccessful.  06/14/2021 Name: Michelle [name] MRN: [id number] DOB: January 16, 1933  Unsuccessful outbound call made today to assist with:   wheelchair ramp.  Outreach Attempt:  1st Attempt  Unable to leave message on home voicemail no answer and cell voicemail not set-up.  Nakshatra Klose, AAS Paralegal, [facility] [employer]  [address], [place] [address] ??[email or web address]  ?? [phone]   [email or web address]

## 2021-06-15 ENCOUNTER — Ambulatory Visit: Payer: Medicare HMO | Admitting: Family Medicine

## 2021-06-16 ENCOUNTER — Other Ambulatory Visit: Payer: Self-pay | Admitting: Cardiovascular Disease

## 2021-06-17 ENCOUNTER — Other Ambulatory Visit: Payer: Self-pay | Admitting: Family Medicine

## 2021-06-22 ENCOUNTER — Other Ambulatory Visit: Payer: Self-pay | Admitting: Cardiovascular Disease

## 2021-06-22 DIAGNOSIS — I10 Essential (primary) hypertension: Secondary | ICD-10-CM

## 2021-06-23 ENCOUNTER — Telehealth: Payer: Self-pay | Admitting: Oncology

## 2021-06-23 NOTE — Telephone Encounter (Signed)
Called and spoke to Michelle [name] (son) regarding decision about surgery.  He said they are waiting to see Dr. Yong Channel on 07/08/21 to make their final decision.  He said Michelle [name] is afraid of surgery.  As long as she is not having pain and the mass does not grow faster, he and his brother would like to wait 6-8 months and repeat imaging. He would like to speak to Dr. Yong Channel and will call us on 07/09/21 to let us know.

## 2021-06-30 ENCOUNTER — Other Ambulatory Visit: Payer: Self-pay | Admitting: Pharmacist Clinician (PhC)/ Clinical Pharmacy Specialist

## 2021-07-06 ENCOUNTER — Encounter: Payer: Self-pay | Admitting: Neurology

## 2021-07-07 ENCOUNTER — Telehealth: Payer: Self-pay

## 2021-07-07 NOTE — Telephone Encounter (Signed)
° °  Telephone encounter was:  Successful.  07/07/2021 Name: [name] MRN: [id number] DOB: 05-Mar-1933  Michelle [name] is a 87 y.o. year old female who is a primary care patient of Yong Channel, Brayton Mars, MD . The community resource team was consulted for assistance with  wheelchair ramp and walker  Care guide performed the following interventions: Spoke with patient's son Kimberla Driskill about [facility] as a resource for wheelchair ramp. [name] stated that patient did not need a walker that she has several.  Gave him information about [facility] if there is a need for DME in the future.   Follow Up Plan:  No further follow up planned at this time. The patient has been provided with needed resources.  Chrissi Crow, AAS Paralegal, [facility] [employer]  [address],  [address] ??[email or web address]   ?? [phone]   [email or web address]

## 2021-07-08 ENCOUNTER — Encounter: Payer: Self-pay | Admitting: Family Medicine

## 2021-07-08 ENCOUNTER — Ambulatory Visit (INDEPENDENT_AMBULATORY_CARE_PROVIDER_SITE_OTHER): Payer: Medicare HMO | Admitting: Family Medicine

## 2021-07-08 ENCOUNTER — Telehealth: Payer: Self-pay | Admitting: *Deleted

## 2021-07-08 VITALS — BP 134/78 | HR 86 | Temp 97.5°F | Ht 65.0 in | Wt 195.6 lb

## 2021-07-08 DIAGNOSIS — E119 Type 2 diabetes mellitus without complications: Secondary | ICD-10-CM

## 2021-07-08 DIAGNOSIS — F411 Generalized anxiety disorder: Secondary | ICD-10-CM | POA: Diagnosis not present

## 2021-07-08 DIAGNOSIS — E785 Hyperlipidemia, unspecified: Secondary | ICD-10-CM | POA: Diagnosis not present

## 2021-07-08 DIAGNOSIS — I10 Essential (primary) hypertension: Secondary | ICD-10-CM | POA: Diagnosis not present

## 2021-07-08 DIAGNOSIS — E1169 Type 2 diabetes mellitus with other specified complication: Secondary | ICD-10-CM | POA: Diagnosis not present

## 2021-07-08 DIAGNOSIS — F0150 Vascular dementia without behavioral disturbance: Secondary | ICD-10-CM | POA: Diagnosis not present

## 2021-07-08 DIAGNOSIS — I5032 Chronic diastolic (congestive) heart failure: Secondary | ICD-10-CM | POA: Diagnosis not present

## 2021-07-08 DIAGNOSIS — Z794 Long term (current) use of insulin: Secondary | ICD-10-CM | POA: Diagnosis not present

## 2021-07-08 MED ORDER — METFORMIN HCL ER 500 MG PO TB24: 500.0000 mg | ORAL_TABLET | Freq: Every day | ORAL | 3 refills | Status: DC

## 2021-07-08 NOTE — Progress Notes (Signed)
Phone [phone] In person visit   Subjective:   Michelle [name] is a 87 y.o. year old very pleasant female patient who presents for/with See problem oriented charting Chief Complaint  Patient presents with   Follow-up    Pt needing to discuss risks of surgery; pt stating not wanting surgery; pt needs an eye dr for exam that's past due   Cough    Pt c/o cough w/ little production    Anxiety    Pt c/o feeling nervous at times and anxious but not had an episode in three weeks    This visit occurred during the SARS-CoV-2 public health emergency.  Safety protocols were in place, including screening questions prior to the visit, additional usage of staff PPE, and extensive cleaning of exam room while observing appropriate contact time as indicated for disinfecting solutions.   Past Medical History-  Patient Active Problem List   Diagnosis Date Noted   Epilepsy ([place]) 03/12/2021    Priority: High   Dementia ([place]) 10/22/2020    Priority: High   Delirium 06/25/2020    Priority: High   Chronic diastolic CHF (congestive heart failure) ([place]) 03/13/2014    Priority: High   CAD- RCA PCI '90s, STEMI-RCA DES 02/18/14 03/05/2014    Priority: High   Type 2 diabetes mellitus without complication, with long-term current use of insulin ([place]) 03/26/2007    Priority: High   Anemia, iron deficiency 10/23/2015    Priority: Medium    CKD (chronic kidney disease), stage III ([place]) 07/25/2014    Priority: Medium    Osteoarthritis, knee 05/13/2014    Priority: Medium    Anxiety state 04/21/2014    Priority: Medium    SOB (shortness of breath) 03/05/2014    Priority: Medium    Hyperlipidemia associated with type 2 diabetes mellitus ([place]) 03/26/2007    Priority: Medium    Essential hypertension 04/21/2006    Priority: Medium    COPD (chronic obstructive pulmonary disease) ([place]) 04/21/2006    Priority: Medium    Actinic keratosis 11/17/2017    Priority: Low   Back pain, lumbosacral 10/10/2012     Priority: Low   Adnexal mass 11/24/2020   Thickened endometrium 11/24/2020    Medications- reviewed and updated Current Outpatient Medications  Medication Sig Dispense Refill   acetaminophen (TYLENOL) 650 MG CR tablet Take 1,300 mg by mouth 3 (three) times daily. Patient take 3 times a day.     albuterol (PROVENTIL) (2.5 MG/3ML) 0.083% nebulizer solution USE THREE MILLILITERS VIA NEBULIZATION BY MOUTH EVERY 6 HOURS AS NEEDED FOR WHEEZING OR SHORTNESS OF BREATH (Patient taking differently: Take 2.5 mg by nebulization every 6 (six) hours as needed for wheezing or shortness of breath.) 75 mL 3   albuterol (VENTOLIN HFA) 108 (90 Base) MCG/ACT inhaler INHALE 1 PUFF EVERY 4 HOURS AS NEEDED FOR WHEEZING, SHORTNESS OF BREATH (RESCUE INHALER IF ADVAIR NOT WORKING) 2 each 2   Alcohol Swabs (B-D SINGLE USE SWABS REGULAR) PADS USE  FOR  TESTING THREE TIMES DAILY 300 each 1   amLODipine (NORVASC) 10 MG tablet TAKE 1 TABLET EVERY DAY 90 tablet 3   aspirin 81 MG tablet Take 81 mg by mouth daily with breakfast.     Blood Glucose Calibration (ACCU-CHEK SMARTVIEW CONTROL) LIQD      blood glucose meter kit and supplies KIT Dispense based on patient and insurance preference. Use up to four times daily as directed. Dx:E11.9 1 each 3   calcium carbonate (OSCAL) 1500 (600 Ca)  MG TABS tablet Take 600 mg of elemental calcium by mouth daily with breakfast.     cholecalciferol (VITAMIN D3) 25 MCG (1000 UNIT) tablet Take 1,000 Units by mouth daily with breakfast.     clopidogrel (PLAVIX) 75 MG tablet TAKE 1 TABLET EVERY DAY 90 tablet 1   diclofenac Sodium (VOLTAREN) 1 % GEL APPLY TWO GRAMS TOPICALLY TO THE AFFECTED AREA 4 TIMES DAILY 300 g 3   DROPLET PEN NEEDLES 31G X 8 MM MISC USE AS DIRECTED WITH LANTUS SOLOSTAR PEN 100 each 4   enalapril (VASOTEC) 20 MG tablet TAKE 1 TABLET TWICE DAILY (Patient taking differently: Take 20 mg by mouth 2 (two) times daily.) 180 tablet 3   fenofibrate micronized (LOFIBRA) 134 MG capsule  TAKE 1 CAPSULE EVERY DAY 90 capsule 3   ferrous sulfate 325 (65 FE) MG tablet Twice a week (Patient taking differently: Take 325 mg by mouth 2 (two) times a week.) 18 tablet 3   Fluticasone-Salmeterol (ADVAIR) 250-50 MCG/DOSE AEPB INHALE 1 PUFF TWICE DAILY (Patient taking differently: Inhale 1 puff into the lungs in the morning and at bedtime.) 683 each 3   folic acid (FOLVITE) 419 MCG tablet Take 400 mcg by mouth daily with breakfast.     furosemide (LASIX) 20 MG tablet TAKE 1 TABLET EVERY DAY 90 tablet 2   hydrALAZINE (APRESOLINE) 25 MG tablet TAKE ONE TABLET BY MOUTH EVERY MORNING AS NEEDED FOR BLOOD PRESSURE 90 tablet 0   hydrALAZINE (APRESOLINE) 50 MG tablet Take 1 tablet (50 mg total) by mouth in the morning and at bedtime. 180 tablet 1   icosapent Ethyl (VASCEPA) 1 g capsule TAKE 2 CAPSULES TWICE DAILY 360 capsule 0   lamoTRIgine (LAMICTAL) 25 MG tablet TAKE ONE TABLET BY MOUTH AT BEDTIME (Patient taking differently: 2 (two) times daily.) 90 tablet 0   Lancets Misc. (ACCU-CHEK FASTCLIX LANCET) KIT 1 Device by Does not apply route. Use as directed for testing blood sugar     Lancets Misc. (ACCU-CHEK FASTCLIX LANCET) KIT Use as directed to test blood sugar 1 kit 1   LANTUS SOLOSTAR 100 UNIT/ML Solostar Pen INJECT  17  TO  20 UNITS SUBCUTANEOUSLY EVERY MORNING (Patient taking differently: Inject 18 Units into the skin 2 (two) times daily.) 30 mL 5   loratadine (CLARITIN) 10 MG tablet Take 10 mg by mouth every other day.     Melatonin 5 MG CAPS Take 5 mg by mouth at bedtime.     nitroGLYCERIN (NITROSTAT) 0.4 MG SL tablet DISSOLVE 1 TAB UNDER TONGUE FOR CHEST PAIN - IF PAIN REMAINS AFTER 5 MIN, CALL 911 AND REPEAT DOSE. MAX 3 TABS IN 15 MINUTES 25 tablet 6   pantoprazole (PROTONIX) 40 MG tablet TAKE 1 TABLET EVERY DAY 90 tablet 1   rosuvastatin (CRESTOR) 40 MG tablet TAKE 1 TABLET EVERY DAY 90 tablet 0   senna-docusate (SENOKOT-S) 8.6-50 MG tablet Take 2 tablets by mouth at bedtime. For AFTER  surgery, do not take if having diarrhea 30 tablet 0   sertraline (ZOLOFT) 50 MG tablet Take 2 tablets (100 mg total) by mouth at bedtime. 180 tablet 2   SPIRIVA HANDIHALER 18 MCG inhalation capsule PLACE 1 CAPSULE INTO HANDIHALER AND INHALE DAILY 90 capsule 2   spironolactone (ALDACTONE) 25 MG tablet Take 1 tablet (25 mg total) by mouth daily. (Patient taking differently: Take 25 mg by mouth daily with breakfast.) 90 tablet 3   TRUE METRIX BLOOD GLUCOSE TEST test strip TEST BLOOD SUGAR  UP  TO FOUR TIMES DAILY AS DIRECTED 400 strip 3   TRUEplus Lancets 33G MISC TEST BLOOD SUGAR UP TO FOUR TIMES DAILY AS DIRECTED 400 each 3   vitamin B-12 (CYANOCOBALAMIN) 1000 MCG tablet Take 1,000 mcg by mouth daily with breakfast.     vitamin E 400 UNIT capsule Take 400 Units by mouth at bedtime.     carvedilol (COREG) 12.5 MG tablet Take 1 tablet (12.5 mg total) by mouth 2 (two) times daily. 60 tablet 3   metFORMIN (GLUCOPHAGE-XR) 500 MG 24 hr tablet Take 1 tablet (500 mg total) by mouth daily with breakfast. 90 tablet 3   No current facility-administered medications for this visit.     Objective:  BP 134/78 (BP Location: Right Arm)    Pulse 86    Temp (!) 97.5 F (36.4 C) (Temporal)    Ht '5\' 5"'  (1.651 m)    Wt 195 lb 9.6 oz (88.7 kg)    SpO2 99%    BMI 32.55 kg/m  Gen: NAD, resting comfortably CV: RRR no murmurs rubs or gallops Lungs: CTAB no crackles, wheeze, rhonchi Ext: trace edema Skin: warm, dry     Assessment and Plan   #memory concern- upcoming visit with Dr. Delice Lesch next year  #Palliative care-patient continues to work with palliative care for [facility]. Maudie Mercury- NP with palliative- has leaned towards not moving forward with surgery -having a hard time getting in and out of vehicles and in and out of house- basically pulling truck into up to front door- son having to pick her up due to knee pain  #Surgery discussion- Patient presents today wanting to discuss about surgery for uterine  mass-potential hysterectomy- she does not want to have this done- but GYN really wants her to have this done- this could certainly be cancer. She is also really worried about anesthesia  # Cough in patient with COPD S:Patient presents with having a cough with little production . Still using advair and spiriva (occasionally takes a break). Albuterol helps some. OTC cough syrup which is safe for heart per labeling has been mild to moderately helpful. Cough worse in last few weeks- mainly dry. Cough fluctuates. Some wheeze A/P: COPD appears pretty stable  other than lingering cough more than baseline- did not get CT yet ordered 03/09/21 so will have team investigate today   # Anxiety S:Medication: Zoloft 100 mg at bedtime - occasionally has increased anxiety and was shaky and couldn't explain how she felt otherwise but for most part pretty controlled. She states burning sensation in arm and feels somewhat hot all over- maybe once a month A/P: want to hold off on changing meds- seems reasonabley controlled-    #Memory loss/vascular dementia with periods of confusion/sundowning-likely delirium S: no recent issues with clear sundowning/confusion. Has not had issues remembering her son or who people are anymore (like sons previously).  With neurology [facility] most recently checked 20 out of 30 with MoCA 13 out of 30-thought to be vascular dementia.  -Denied low blood sugars when confusion occurred - confusion gradually improved when got off the twice daily lasix  -  I agree with family-sounds like with mild dehydration on increased Lasix patient had increased confusion-her symptoms have resolved back on once daily Lasix 20 mg  Today, overall stable A/P: appears stable- continue to monitor  # Chronic diastolic CHF/left leg swelling S: continued to despite being on amlodipine- this may be counterbalanced by spironolactone 25 mg daily and Lasix 20 mg daily (switched to twice  daily for 3 days to see if help  with leg swelling but seemed dehydrated) -no signs of fluid overload- stabled to edema -weight up some and some swelling noted (weight appears up 7 lbs but has also been the holidays). Has been doing a lot of sweets/cookies/klondike bars A/P: if weight increases further could do perhaps every 3rd day with 2 doses of lasix- be more cautious than every day twice daily due to prior dehydration. Does appear to be slightly up on fluid status  #resistant hypertension S: medication:  Amlodipine 10 mg daily, lasix 20 mg daily, hydralazine 25 mg Am with 50 mg every 8 hours after, spironolactone 25 mg daily, carvedilol 12.5 mg BIDand enalapril 20 mg twice daily. BP Readings from Last 3 Encounters:  07/08/21 134/78  03/17/21 118/62  03/12/21 138/68  A/P: Controlled. Continue current medications.    #CAD #hyperlipidemia S: Medication:Plavix 75 mg, Aspirin 81 mg, rosuvastatin 40 mg daily - no chest pain. Stable shortness of breath  Lab Results  Component Value Date   CHOL 128 02/18/2020   HDL 49 (L) 02/18/2020   LDLCALC 42 02/18/2020   LDLDIRECT 40.0 07/23/2019   TRIG 355 (H) 02/18/2020   CHOLHDL 2.6 02/18/2020  A/P: CAD largely asymptomatic- SOB from sedentary activity- has not had chest pain thoguh to need nitroglycerin.   Lipids at goal previously- update lipids and continue current meds for now  # Diabetes S: Medication:Lantus 18 units plus metformin 500 mg extended release twice daily- was on 1000 mg but lowered dose due to loose stools CBGs- luckily despite intake sugars are ok - highest 130s often in 110s or 120s though Exercise and diet- very sedentary Lab Results  Component Value Date   HGBA1C 6.4 01/14/2021   HGBA1C 6.5 (H) 08/26/2020   HGBA1C 6.6 (H) 06/25/2020  A/P: as long as a1c is under 7.5 lets hold the metformin for 2 weeks then resstart with just once a day to see if diarrhea clears. Well controlled in past  # B12 deficiency S: Current treatment/medication (oral vs. IM):  patient remains on oral B-12- #s looked better last visit Lab Results  Component Value Date   [id number] 01/14/2021  A/P: reasonable control last check- continue current meds   Recommended follow up:  14 weeks follow up then 14 weeks after for CPE Future Appointments  Date Time Provider [place]  08/19/2021  2:30 PM [name], MD [facility] None  10/12/2021  2:15 PM [facility] CCM PHARMACIST [facility]  06/20/2022  2:30 PM [facility] HEALTH COACH [facility]   Lab/Order associations:   ICD-10-CM   1. Essential hypertension  I10     2. Chronic diastolic CHF (congestive heart failure) (HCC)  I50.32     3. Vascular dementia, unspecified dementia severity, unspecified whether behavioral, psychotic, or mood disturbance or anxiety ([place])  F01.50     4. Anxiety state  F41.1     5. Type 2 diabetes mellitus without complication, with long-term current use of insulin (HCC)  E11.9 CBC with Differential/Platelet   Z79.4 Comprehensive metabolic panel    Lipid panel    Hemoglobin A1c    6. Hyperlipidemia associated with type 2 diabetes mellitus (HCC)  E11.69    E78.5       Meds ordered this encounter  Medications   metFORMIN (GLUCOPHAGE-XR) 500 MG 24 hr tablet    Sig: Take 1 tablet (500 mg total) by mouth daily with breakfast.    Dispense:  90 tablet  Refill:  3   I,Harris Phan,acting as a scribe for [name], MD.,have documented all relevant documentation on the behalf of [name], MD,as directed by  [name], MD while in the presence of [name], MD.  I, [name], MD, have reviewed all documentation for this visit. The documentation on 07/08/21 for the exam, diagnosis, procedures, and orders are all accurate and complete.  Return precautions advised.  [name], MD

## 2021-07-08 NOTE — Telephone Encounter (Signed)
Michelle [name], pt was suppose to be scheduled for CT san back in August. Orders were placed but never done. Please look into this per Dr. Yong Channel.

## 2021-07-08 NOTE — Patient Instructions (Addendum)
Health Maintenance Due  Topic Date Due   Zoster Vaccines- Shingrix (1 of 2)- maybe in the spring do this Never done   OPHTHALMOLOGY EXAM -  We will call you within two weeks about your referral to [place]. If you do not hear within 2 weeks, give [place] a call.  04/28/2018   COVID-19 Vaccine (4 - Booster for Moderna series)- schedule in 3-4 weeks at pharmacy 11/28/2020   INFLUENZA VACCINE - today high dose 02/15/2021   Please stop by lab before you go If you have mychart- we will send your results within 3 business days of [place] receiving them.  If you do not have mychart- we will call you about results within 5 business days of [place] receiving them.  *please also note that you will see labs on mychart as soon as they post. I will later go in and write notes on them- will say "notes from Dr. Yong Channel"  Your weight is up by 7 lbs today. If your weight increases further, could perhaps do every third day with 2 doses of Lasix.  If your A1c is under 7.5%, let's hold your metformin for 2 weeks then restart to just once a day to see if your diarrhea resolves.  Recommended follow up: Return in about 14 weeks (around 10/14/2021) for a follow-up or sooner if needed - you can schedule another 14 weeks out beyond that for your next physical

## 2021-07-09 LAB — CBC WITH DIFFERENTIAL/PLATELET
Basophils Absolute: 0.1 10*3/uL (ref 0.0–0.1)
Basophils Relative: 1.1 % (ref 0.0–3.0)
Eosinophils Absolute: 0.1 10*3/uL (ref 0.0–0.7)
Eosinophils Relative: 1.2 % (ref 0.0–5.0)
HCT: 38.2 % (ref 36.0–46.0)
Hemoglobin: 12.9 g/dL (ref 12.0–15.0)
Lymphocytes Relative: 22.3 % (ref 12.0–46.0)
Lymphs Abs: 1.9 10*3/uL (ref 0.7–4.0)
MCHC: 33.7 g/dL (ref 30.0–36.0)
MCV: 89.7 fl (ref 78.0–100.0)
Monocytes Absolute: 0.7 10*3/uL (ref 0.1–1.0)
Monocytes Relative: 8.6 % (ref 3.0–12.0)
Neutro Abs: 5.6 10*3/uL (ref 1.4–7.7)
Neutrophils Relative %: 66.8 % (ref 43.0–77.0)
Platelets: 229 10*3/uL (ref 150.0–400.0)
RBC: 4.26 Mil/uL (ref 3.87–5.11)
RDW: 13.3 % (ref 11.5–15.5)
WBC: 8.4 10*3/uL (ref 4.0–10.5)

## 2021-07-09 LAB — LIPID PANEL
Cholesterol: 133 mg/dL (ref 0–200)
HDL: 44.3 mg/dL (ref >39.00)
Total CHOL/HDL Ratio: 3
Triglycerides: 430 mg/dL — ABNORMAL HIGH (ref 0.0–149.0)

## 2021-07-09 LAB — COMPREHENSIVE METABOLIC PANEL
ALT: 10 U/L (ref 0–35)
AST: 13 U/L (ref 0–37)
Albumin: 4.3 g/dL (ref 3.5–5.2)
Alkaline Phosphatase: 39 U/L (ref 39–117)
BUN: 26 mg/dL — ABNORMAL HIGH (ref 6–23)
CO2: 28 mEq/L (ref 19–32)
Calcium: 10.1 mg/dL (ref 8.4–10.5)
Chloride: 99 mEq/L (ref 96–112)
Creatinine, Ser: 1.05 mg/dL (ref 0.40–1.20)
GFR: 48.11 mL/min — ABNORMAL LOW (ref >60.00)
Glucose, Bld: 109 mg/dL — ABNORMAL HIGH (ref 70–99)
Potassium: 4.2 mEq/L (ref 3.5–5.1)
Sodium: 136 mEq/L (ref 135–145)
Total Bilirubin: 0.3 mg/dL (ref 0.2–1.2)
Total Protein: 7 g/dL (ref 6.0–8.3)

## 2021-07-09 LAB — LDL CHOLESTEROL, DIRECT: Direct LDL: 44 mg/dL

## 2021-07-09 LAB — HEMOGLOBIN A1C: Hgb A1c MFr Bld: 6.3 % (ref 4.6–6.5)

## 2021-07-15 ENCOUNTER — Telehealth: Payer: Self-pay | Admitting: Oncology

## 2021-07-15 NOTE — Telephone Encounter (Signed)
Called Michelle [name] (son) regarding surgery decision.  He said Michelle [name] wants to wait for surgery and wants to repeat imaging in 6-8 months. He said she is afraid of surgery and is not currently having any pain so they would like to wait. Advised him that I will let Dr. Berline Lopes know and to call if they have any questions.

## 2021-07-21 ENCOUNTER — Telehealth: Payer: Self-pay

## 2021-07-21 NOTE — Telephone Encounter (Signed)
Yes can restart metformin - we can try a short term hold again in future if needed to calm this back down

## 2021-07-21 NOTE — Telephone Encounter (Signed)
States Dr. Yong Channel took patient off of metformin  last week due to having diarrhea.  States this decreased the diarrhea but it has not stopped.  States blood sugar is starting to rise.  States patient was instructed to take a whole pill again in the evenings on Friday.  Would like to know of patient could start the metformin today?    Also, states that Dr. Yong Channel had placed patient on extended release of metformin and that seemed to assist the diarrhea better.    Please follow up with son in regard.

## 2021-07-21 NOTE — Telephone Encounter (Signed)
See below

## 2021-07-22 NOTE — Telephone Encounter (Signed)
Called and spoke with Michelle [name] and message below given.

## 2021-07-28 ENCOUNTER — Other Ambulatory Visit (HOSPITAL_BASED_OUTPATIENT_CLINIC_OR_DEPARTMENT_OTHER): Payer: Self-pay | Admitting: Cardiovascular Disease

## 2021-08-02 ENCOUNTER — Other Ambulatory Visit: Payer: Self-pay | Admitting: Family Medicine

## 2021-08-06 NOTE — Progress Notes (Signed)
[facility] Note  08/09/2021     CHIEF COMPLAINT Patient presents for Retina Follow Up   HISTORY OF PRESENT ILLNESS: Michelle [name] is a 88 y.o. female who presents to the clinic today for:   HPI     Retina Follow Up   Patient presents with  Other.  In both eyes.  Severity is mild.  Duration of 3 years.  Since onset it is stable.  I, the attending physician,  performed the HPI with the patient and updated documentation appropriately.        Comments   Pt here for annual diabetic eye exam, overdue from 2020. Pt states her vision hasn't much changed. She did have OS cataract removed but is unsure when/what doctor. OD was done before that. Last A1C taken was 6.3 in Dec 2022. Blood sugar this morning was 136.       Last edited by Bernarda Caffey, MD on 08/10/2021 11:39 PM.     Pt states she has had cataract sx, but does know who did it  Referring physician: [name], MD [place],  [place] [address]  HISTORICAL INFORMATION:   Selected notes from the MEDICAL RECORD NUMBER Referred by Dr. Yong Channel (PCP) for DM exam LEE:  Ocular Hx- PMH-   CURRENT MEDICATIONS: No current outpatient medications on file. (Ophthalmic Drugs)   No current facility-administered medications for this visit. (Ophthalmic Drugs)   Current Outpatient Medications (Other)  Medication Sig   acetaminophen (TYLENOL) 650 MG CR tablet Take 1,300 mg by mouth 3 (three) times daily. Patient take 3 times a day.   albuterol (PROVENTIL) (2.5 MG/3ML) 0.083% nebulizer solution USE THREE MILLILITERS VIA NEBULIZATION BY MOUTH EVERY 6 HOURS AS NEEDED FOR WHEEZING OR SHORTNESS OF BREATH (Patient taking differently: Take 2.5 mg by nebulization every 6 (six) hours as needed for wheezing or shortness of breath.)   albuterol (VENTOLIN HFA) 108 (90 Base) MCG/ACT inhaler INHALE 1 PUFF EVERY 4 HOURS AS NEEDED FOR WHEEZING, SHORTNESS OF BREATH (RESCUE INHALER IF ADVAIR NOT  WORKING)   Alcohol Swabs (B-D SINGLE USE SWABS REGULAR) PADS USE  FOR  TESTING THREE TIMES DAILY   amLODipine (NORVASC) 10 MG tablet TAKE 1 TABLET EVERY DAY   aspirin 81 MG tablet Take 81 mg by mouth daily with breakfast.   Blood Glucose Calibration (ACCU-CHEK SMARTVIEW CONTROL) LIQD    blood glucose meter kit and supplies KIT Dispense based on patient and insurance preference. Use up to four times daily as directed. Dx:E11.9   calcium carbonate (OSCAL) 1500 (600 Ca) MG TABS tablet Take 600 mg of elemental calcium by mouth daily with breakfast.   cholecalciferol (VITAMIN D3) 25 MCG (1000 UNIT) tablet Take 1,000 Units by mouth daily with breakfast.   clopidogrel (PLAVIX) 75 MG tablet TAKE 1 TABLET EVERY DAY   diclofenac Sodium (VOLTAREN) 1 % GEL APPLY TWO GRAMS TOPICALLY TO THE AFFECTED AREA 4 TIMES DAILY   DROPLET PEN NEEDLES 31G X 8 MM MISC USE AS DIRECTED WITH LANTUS SOLOSTAR PEN   enalapril (VASOTEC) 20 MG tablet TAKE 1 TABLET TWICE DAILY (Patient taking differently: Take 20 mg by mouth 2 (two) times daily.)   fenofibrate micronized (LOFIBRA) 134 MG capsule TAKE 1 CAPSULE EVERY DAY   ferrous sulfate 325 (65 FE) MG tablet Twice a week (Patient taking differently: Take 325 mg by mouth 2 (two) times a week.)   Fluticasone-Salmeterol (ADVAIR) 250-50 MCG/DOSE AEPB INHALE 1 PUFF TWICE DAILY (Patient  taking differently: Inhale 1 puff into the lungs in the morning and at bedtime.)   folic acid (FOLVITE) 765 MCG tablet Take 400 mcg by mouth daily with breakfast.   furosemide (LASIX) 20 MG tablet TAKE 1 TABLET EVERY DAY   hydrALAZINE (APRESOLINE) 25 MG tablet TAKE ONE TABLET BY MOUTH EVERY MORNING AS NEEDED FOR BLOOD PRESSURE   hydrALAZINE (APRESOLINE) 50 MG tablet Take 1 tablet (50 mg total) by mouth in the morning and at bedtime. Patient is overdue for appointment. Patient must call our office and schedule an appointment for any further refills   icosapent Ethyl (VASCEPA) 1 g capsule TAKE 2 CAPSULES  TWICE DAILY   lamoTRIgine (LAMICTAL) 25 MG tablet TAKE ONE TABLET BY MOUTH AT BEDTIME (Patient taking differently: 2 (two) times daily.)   Lancets Misc. (ACCU-CHEK FASTCLIX LANCET) KIT 1 Device by Does not apply route. Use as directed for testing blood sugar   Lancets Misc. (ACCU-CHEK FASTCLIX LANCET) KIT Use as directed to test blood sugar   LANTUS SOLOSTAR 100 UNIT/ML Solostar Pen INJECT  17  TO  20 UNITS SUBCUTANEOUSLY EVERY MORNING (Patient taking differently: Inject 18 Units into the skin 2 (two) times daily.)   loratadine (CLARITIN) 10 MG tablet Take 10 mg by mouth every other day.   Melatonin 5 MG CAPS Take 5 mg by mouth at bedtime.   metFORMIN (GLUCOPHAGE-XR) 500 MG 24 hr tablet Take 1 tablet (500 mg total) by mouth daily with breakfast.   nitroGLYCERIN (NITROSTAT) 0.4 MG SL tablet DISSOLVE 1 TAB UNDER TONGUE FOR CHEST PAIN - IF PAIN REMAINS AFTER 5 MIN, CALL 911 AND REPEAT DOSE. MAX 3 TABS IN 15 MINUTES   pantoprazole (PROTONIX) 40 MG tablet TAKE 1 TABLET EVERY DAY   rosuvastatin (CRESTOR) 40 MG tablet TAKE 1 TABLET EVERY DAY   senna-docusate (SENOKOT-S) 8.6-50 MG tablet Take 2 tablets by mouth at bedtime. For AFTER surgery, do not take if having diarrhea   sertraline (ZOLOFT) 50 MG tablet Take 2 tablets (100 mg total) by mouth at bedtime.   SPIRIVA HANDIHALER 18 MCG inhalation capsule PLACE 1 CAPSULE INTO HANDIHALER AND INHALE DAILY   spironolactone (ALDACTONE) 25 MG tablet TAKE 1 TABLET (25 MG TOTAL) BY MOUTH DAILY.   TRUE METRIX BLOOD GLUCOSE TEST test strip TEST BLOOD SUGAR  UP  TO FOUR TIMES DAILY AS DIRECTED   TRUEplus Lancets 33G MISC TEST BLOOD SUGAR UP TO FOUR TIMES DAILY AS DIRECTED   vitamin B-12 (CYANOCOBALAMIN) 1000 MCG tablet Take 1,000 mcg by mouth daily with breakfast.   vitamin E 400 UNIT capsule Take 400 Units by mouth at bedtime.   carvedilol (COREG) 12.5 MG tablet Take 1 tablet (12.5 mg total) by mouth 2 (two) times daily.   No current facility-administered  medications for this visit. (Other)   REVIEW OF SYSTEMS: ROS   Positive for: Endocrine, Cardiovascular, Eyes, Respiratory Negative for: Constitutional, Gastrointestinal, Neurological, Skin, Genitourinary, Musculoskeletal, HENT, Psychiatric, Allergic/Imm, Heme/Lymph Last edited by Kingsley Spittle, COT on 08/09/2021  2:52 PM.     ALLERGIES Allergies  Allergen Reactions   Codeine Sulfate Nausea Only   Morphine Sulfate Nausea Only   PAST MEDICAL HISTORY Past Medical History:  Diagnosis Date   Anginal pain ([place])    Arthritis    AV block, 1st degree    CAD (coronary artery disease) 1990; 2015   Cardiac cath 1990 with Dr. Lia Foyer and pt reports blockage in artery  with angioplasty. She has pictures that show severe stenosis mid RCA  and a post PTCA picture with 30% residual stenosis post PTCA. Residual CAD, non obstructive per 2015 cath. STEMI status post stent in August 2015.   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   Promus DES to RCA with STEMI   Shortness of breath    Past Surgical History:  Procedure Laterality Date   CATARACT EXTRACTION  2020   right and left eye    CHOLECYSTECTOMY     thinks her appendix was removed at the same time   [place]  02/18/14   Promus DES to RCA   [place] N/A 02/18/2014   Procedure: [place];  Surgeon: Jettie Booze, MD;  Location: [facility] CATH LAB;  Service: Cardiovascular;  Laterality: N/A;   PTCA  1990   PTCA of RCA   TRANSTHORACIC ECHOCARDIOGRAM  02/02/2012   mild LVH, EF 55-60%, Normal WM, Gr 1 DD; Mild MR   FAMILY  HISTORY Family History  Problem Relation Age of Onset   Heart attack Mother 85   Cancer Father 35   Cancer Maternal Grandfather    Cancer Paternal Grandmother    Cancer Paternal Grandfather    Diabetes Son    Liver cancer Brother    Bone cancer Brother    Heart attack Son    Hypertension Son    Diabetes Son    Hyperlipidemia Son    Colon cancer Neg Hx    Ovarian cancer Neg Hx    Uterine cancer Neg Hx    SOCIAL HISTORY Social History   Tobacco Use   Smoking status: Former    Packs/day: 1.00    Years: 55.00    Pack years: 55.00    Types: Cigarettes    Quit date: 09/29/2006    Years since quitting: 14.8   Smokeless tobacco: Never  Vaping Use   Vaping Use: Never used  Substance Use Topics   Alcohol use: No   Drug use: No       OPHTHALMIC EXAM:  Base Eye Exam     Visual Acuity (Snellen - Linear)       Right Left   Dist [place] 20/30 +2 20/40   Dist ph Guin NI NI         Tonometry (Tonopen, 3:04 PM)       Right Left   Pressure 18 21         Pupils       Dark Light Shape React APD   Right 3 2 Round Brisk None   Left 3 2 Round Brisk None         Visual Fields (Counting fingers)       Left Right    Full Full         Extraocular Movement       Right Left    Full, Ortho Full, Ortho         Neuro/Psych     Oriented x3: Yes   Mood/Affect: Normal         Dilation     Both eyes: 1.0% Mydriacyl, 2.5% Phenylephrine @ 3:05 PM  Slit Lamp and Fundus Exam     Slit Lamp Exam       Right Left   Lids/Lashes Dermatochalasis - lower lid, mild ptosis Dermatochalasis - lower lid, mild Ptosis   Conjunctiva/Sclera Trace Injection White and quiet   Cornea Arcus, well healed cataract wound Mild Arcus, trace Punctate epithelial erosions, mild tear film debris   Anterior Chamber Deep and quiet, no cell or flare Deep and quiet, no cell or flare   Iris Round and dilated, No NVI Round and dilated, No NVI   Lens PC IOL in good position, open  Posterior capsular opacification PC IOL in good position, open PC   Anterior Vitreous Vitreous syneresis, Posterior vitreous detachment Vitreous syneresis, Posterior vitreous detachment         Fundus Exam       Right Left   Disc trace Pallor, mild temporal Peripapillary atrophy trace Pallor, mild temporal Peripapillary atrophy   C/D Ratio 0.4 0.4   Macula Flat, Blunted foveal reflex, RPE mottling and clumping, No heme or edema Flat, good foveal reflex, RPE mottling and clumping, No heme or edema   Vessels Vascular attenuation, Tortuous Vascular attenuation, Tortuous   Periphery Attached, reticular degeneration, no heme, no RT/RD Attached, reticular degeneration, no heme, no RT/RD           Refraction     Manifest Refraction       Sphere Cylinder Dist VA   Right      Left +0.50 Sphere 20/40           IMAGING AND PROCEDURES  Imaging and Procedures for '@TODAY' @  OCT, Retina - OU - Both Eyes       Right Eye Quality was good. Central Foveal Thickness: 255. Progression has been stable. Findings include normal foveal contour, no SRF, no IRF (stable).   Left Eye Quality was borderline. Central Foveal Thickness: 252. Progression has been stable. Findings include normal foveal contour, no IRF, no SRF (stable).   Notes *Images captured and stored on drive  Diagnosis / Impression:  NFP, no IRF/SRF OU--stable from prior No DME OU  Clinical management:  See below  Abbreviations: NFP - Normal foveal profile. CME - cystoid macular edema. PED - pigment epithelial detachment. IRF - intraretinal fluid. SRF - subretinal fluid. EZ - ellipsoid zone. ERM - epiretinal membrane. ORA - outer retinal atrophy. ORT - outer retinal tubulation. SRHM - subretinal hyper-reflective material            ASSESSMENT/PLAN:    ICD-10-CM   1. Diabetes mellitus type 2 without retinopathy ([place])  E11.9 OCT, Retina - OU - Both Eyes    2. Essential hypertension  I10     3. Hypertensive  retinopathy of both eyes  H35.033 OCT, Retina - OU - Both Eyes    4. Pseudophakia of both eyes  Z96.1     5. PCO (posterior capsular opacification), bilateral  H26.493      1. Diabetes mellitus, type 2 without retinopathy  - pt is delayed to follow up from 1 year to 2+ years (10.16.20 to 01.23.23)  - The incidence, risk factors for progression, natural history and treatment options for diabetic retinopathy  were discussed with patient.    - The need for close monitoring of blood glucose, blood pressure, and serum lipids, avoiding cigarette or any type of tobacco, and the need for long term follow up was also discussed with patient.  - f/u in 1 year, sooner prn  2,3. Hypertensive retinopathy OU  -  discussed importance of tight BP control  - monitor  4. Pseudophakia OU  - s/p CE/IOL OU -- pt unable to remember surgeon  - IOLs in good position  - monitor  5. PCO OU (OD > OS)  - s/p YAG cap OD (09.16.20), BCVA OD 20/30 improved from 20/200  - S/P YAG cap OS (09.30.20), BCVA OS back down to 20/40 today  Ophthalmic Meds Ordered this visit:  No orders of the defined types were placed in this encounter.    Return in about 1 year (around 08/09/2022) for f/u DM exam , DFE, OCT.  There are no Patient Instructions on file for this visit.   Explained the diagnoses, plan, and follow up with the patient and they expressed understanding.  Patient expressed understanding of the importance of proper follow up care.   This document serves as a record of services personally performed by Gardiner Sleeper, MD, PhD. It was created on their behalf by Roselee Nova, COMT. The creation of this record is the provider's dictation and/or activities during the visit.  Electronically signed by: Roselee Nova, COMT 08/10/21 11:40 PM   This document serves as a record of services personally performed by Gardiner Sleeper, MD, PhD. It was created on their behalf by San Jetty. Owens Shark, OA an ophthalmic technician. The  creation of this record is the provider's dictation and/or activities during the visit.    Electronically signed by: San Jetty. Owens Shark, [place] 01.23.2023 11:40 PM  Gardiner Sleeper, M.D., Ph.D. Diseases & Surgery of the Retina and Vitreous [facility]  I have reviewed the above documentation for accuracy and completeness, and I agree with the above. Gardiner Sleeper, M.D., Ph.D. 08/10/21 11:43 PM   Abbreviations: M myopia (nearsighted); A astigmatism; H hyperopia (farsighted); P presbyopia; Mrx spectacle prescription;  CTL contact lenses; OD right eye; OS left eye; OU both eyes  XT exotropia; ET esotropia; PEK punctate epithelial keratitis; PEE punctate epithelial erosions; DES dry eye syndrome; MGD meibomian gland dysfunction; ATs artificial tears; PFAT's preservative free artificial tears; [place] nuclear sclerotic cataract; PSC posterior subcapsular cataract; ERM epi-retinal membrane; PVD posterior vitreous detachment; RD retinal detachment; DM diabetes mellitus; DR diabetic retinopathy; NPDR non-proliferative diabetic retinopathy; PDR proliferative diabetic retinopathy; CSME clinically significant macular edema; DME diabetic macular edema; dbh dot blot hemorrhages; CWS cotton wool spot; POAG primary open angle glaucoma; C/D cup-to-disc ratio; HVF humphrey visual field; GVF goldmann visual field; OCT optical coherence tomography; IOP intraocular pressure; BRVO Branch retinal vein occlusion; CRVO central retinal vein occlusion; CRAO central retinal artery occlusion; BRAO branch retinal artery occlusion; RT retinal tear; SB scleral buckle; PPV pars plana vitrectomy; VH Vitreous hemorrhage; PRP panretinal laser photocoagulation; IVK intravitreal kenalog; VMT vitreomacular traction; MH Macular hole;  NVD neovascularization of the disc; NVE neovascularization elsewhere; AREDS age related eye disease study; ARMD age related macular degeneration; POAG primary open angle glaucoma; EBMD  epithelial/anterior basement membrane dystrophy; ACIOL anterior chamber intraocular lens; IOL intraocular lens; PCIOL posterior chamber intraocular lens; Phaco/IOL phacoemulsification with intraocular lens placement; [place] photorefractive keratectomy; LASIK laser assisted in situ keratomileusis; HTN hypertension; DM diabetes mellitus; COPD chronic obstructive pulmonary disease

## 2021-08-09 ENCOUNTER — Encounter (INDEPENDENT_AMBULATORY_CARE_PROVIDER_SITE_OTHER): Payer: Self-pay | Admitting: Ophthalmology

## 2021-08-09 ENCOUNTER — Ambulatory Visit (INDEPENDENT_AMBULATORY_CARE_PROVIDER_SITE_OTHER): Payer: Medicare HMO | Admitting: Ophthalmology

## 2021-08-09 ENCOUNTER — Other Ambulatory Visit: Payer: Self-pay

## 2021-08-09 DIAGNOSIS — H26493 Other secondary cataract, bilateral: Secondary | ICD-10-CM | POA: Diagnosis not present

## 2021-08-09 DIAGNOSIS — Z961 Presence of intraocular lens: Secondary | ICD-10-CM | POA: Diagnosis not present

## 2021-08-09 DIAGNOSIS — H35033 Hypertensive retinopathy, bilateral: Secondary | ICD-10-CM

## 2021-08-09 DIAGNOSIS — I10 Essential (primary) hypertension: Secondary | ICD-10-CM | POA: Diagnosis not present

## 2021-08-09 DIAGNOSIS — E119 Type 2 diabetes mellitus without complications: Secondary | ICD-10-CM

## 2021-08-10 ENCOUNTER — Encounter (INDEPENDENT_AMBULATORY_CARE_PROVIDER_SITE_OTHER): Payer: Self-pay | Admitting: Ophthalmology

## 2021-08-10 LAB — HM DIABETES EYE EXAM

## 2021-08-11 ENCOUNTER — Encounter: Payer: Self-pay | Admitting: Family Medicine

## 2021-08-11 NOTE — Progress Notes (Signed)
Abstracted no retinopathy in chart.

## 2021-08-11 NOTE — Progress Notes (Signed)
Please abstract no retinopathy under her ophthalmology exam for diabetes

## 2021-08-12 ENCOUNTER — Telehealth: Payer: Self-pay | Admitting: Cardiovascular Disease

## 2021-08-12 MED ORDER — HYDRALAZINE HCL 50 MG PO TABS: 50.0000 mg | ORAL_TABLET | Freq: Two times a day (BID) | ORAL | 0 refills | Status: DC

## 2021-08-12 NOTE — Telephone Encounter (Signed)
°*  STAT* If patient is at the pharmacy, call can be transferred to refill team.   1. Which medications need to be refilled? (please list name of each medication and dose if known) hydrALAZINE (APRESOLINE) 50 MG tablet  2. Which pharmacy/location (including street and city if local pharmacy) is medication to be sent to? [facility] [id number] - [address], [place]  3. Do they need a 30 day or 90 day supply? 90 day   Patient has an appointment 09/16/2021

## 2021-08-19 ENCOUNTER — Encounter: Payer: Self-pay | Admitting: Neurology

## 2021-08-19 ENCOUNTER — Other Ambulatory Visit: Payer: Self-pay

## 2021-08-19 ENCOUNTER — Telehealth (INDEPENDENT_AMBULATORY_CARE_PROVIDER_SITE_OTHER): Payer: Medicare HMO | Admitting: Neurology

## 2021-08-19 VITALS — Ht 65.0 in | Wt 193.0 lb

## 2021-08-19 DIAGNOSIS — G40009 Localization-related (focal) (partial) idiopathic epilepsy and epileptic syndromes with seizures of localized onset, not intractable, without status epilepticus: Secondary | ICD-10-CM | POA: Diagnosis not present

## 2021-08-19 DIAGNOSIS — F0150 Vascular dementia without behavioral disturbance: Secondary | ICD-10-CM | POA: Diagnosis not present

## 2021-08-19 MED ORDER — LAMOTRIGINE 25 MG PO TABS: ORAL_TABLET | ORAL | 3 refills | Status: DC

## 2021-08-19 NOTE — Patient Instructions (Signed)
Continue Lamotrigine 25mg  twice a day  2. Continue 24/7 care.  3. Follow-up in 6 months, call for any changes   Seizure Precautions: 1. If medication has been prescribed for you to prevent seizures, take it exactly as directed.  Do not stop taking the medicine without talking to your doctor first, even if you have not had a seizure in a long time.   2. Avoid activities in which a seizure would cause danger to yourself or to others.  Don't operate dangerous machinery, swim alone, or climb in high or dangerous places, such as on ladders, roofs, or girders.  Do not drive unless your doctor says you may.  3. If you have any warning that you may have a seizure, lay down in a safe place where you can't hurt yourself.    4.  No driving for 6 months from last seizure, as per [facility].   Please refer to the following link on the [place] website for more information: http://[email or web address].org/[email or web address]/driving/drivingu.cfm   5.  Maintain good sleep hygiene.  6.  Contact your doctor if you have any problems that may be related to the medicine you are taking.  7.  Call 911 and bring the patient back to the ED if:        A.  The seizure lasts longer than 5 minutes.       B.  The patient doesn't awaken shortly after the seizure  C.  The patient has new problems such as difficulty seeing, speaking or moving  D.  The patient was injured during the seizure  E.  The patient has a temperature over 102 F (39C)  F.  The patient vomited and now is having trouble breathing

## 2021-08-19 NOTE — Progress Notes (Signed)
Virtual Visit via Video Note The purpose of this virtual visit is to provide medical care while limiting exposure to the novel coronavirus.    Consent was obtained for video visit:  Yes.   Answered questions that patient had about telehealth interaction:  Yes.   I discussed the limitations, risks, security and privacy concerns of performing an evaluation and management service by telemedicine. I also discussed with the patient that there may be a patient responsible charge related to this service. The patient expressed understanding and agreed to proceed.  Pt location: Home Physician Location: office Name of referring provider:  [name], MD I connected with [name] at patients initiation/request on 08/19/2021 at  2:30 PM EST by video enabled telemedicine application and verified that I am speaking with the correct person using two identifiers. Pt MRN:  [id number] Pt DOB:  1933-06-22 Video Participants:  [name];  Phoebe Sharps (sone)   History of Present Illness:  The patient had a virtual video visit on 08/19/2021. She was last seen in the neurology clinic 7 months ago for left temporal lobe epilepsy and dementia. Her son Michelle [name] is present to provide additional information. On her last visit, he reported 2 episodes of confusion where she was looking around, unable to answer questions or follow instructions. No episodes of loss of consciousness with sonorous respirations since January 2022. She had side effects on Depakote. Lamotrigine dose was increased to 74m BID in July 2022, no side effects. She has had a couple of times where she was confused for a minute, thinking she was talking to her mother, however no staring/unresponsiveness noted. No focal numbness/tingling/weakness, no myoclonic jerks. She denies any headaches, dizziness, vision changes, no falls. Both legs hurt due to arthritis, she ambulates with a walker. Sleep is good most of the times. Mood is okay. Memory  is "terrible." Her son manages medications, finances, meals. She does not drive. She is independent with dressing and bathing. She has 24/7 supervision.     History on Initial Assessment 09/22/2020: This is an 88year old right-handed woman with a history of hypertension, hyperlipidemia, diabetes, CAD, 1st degree heart block, presenting for evaluation of confusion. Her son Michelle [name] present to provide additional information. She states "I don't remember everything too well." Michelle Casareports that for the most part she is doing okay. She has more confusion when she is admitted in the hospital, in November she did not even recognize Michelle Casaand kept thinking he was his brother for a few days. He notices memory issues on occasion. She has been living with Michelle Casafor over 60 years. Her other son was managing her medications because she is on so many, then during the pandemic, Michelle Casatook over 2 years ago. He manages meals. Her other son has been managing her finances for the past 3 years, "he just wanted to help." She stopped driving in [id number] it got to where she did not know what she was doing, she could not remember what was supposed to be beside of her. Michelle Casareports it was due to her vision. She is independent with dressing and bathing.He states she does not repeat herself for the most part. In December she had more confusion, not knowing what was going on. She was brought to the ER and had an MRI brain without contrast which did not show any acute changes, there was mild to moderate diffuse atrophy and chronic microvascular disease. [facility] he heard her snoring very loudly, he came to find her with eyes wide open, jaw clenched doing the snoring sound. She was unresponsive with body stiff. EMS got some response and she was confused in the ER wanting to go home. BP was 186/81. She had an EEG which showed intermittent generalized slowing, head CT no acute changes. TSH, B12, RPR  normal. She was diagnosed with hypertensive encephalopathy and had delirium in the hospital that improved with Seroquel. Michelle [name] reports she has improved since then and has been back to normal. No further similar episodes.    She denies any gaps in time, olfactory/gustatory hallucinations, focal numbness/tingling/weakness, myoclonic jerks. She has diarrhea frequently with some incontinence and plans to start wearing Depends. No bladder incontinence. She cannot smell anything. She has occasional hand tremors that do not affect activities. She has pain in her knees. She denies any headaches, dizziness, diplopia, dysarthria/dysphagia, neck/back pain. She has sleep difficulties, sometimes staying awake all night long. She does not take too many naps in the day. No family history of dementia, significant head injuries, alcohol use. No family history of seizures.   Diagnostic Data: MRI brain without contrast 06/2020 no acute changes, diffuse volume loss and mild chronic microvascular disease  Routine EEG 07/2020: intermittent slow, generalized  24-hour EEG in 09/2020 abnormal due to focal slowing over the left>right temporal regions, occasional left frontotemporal epileptiform discharges    Current Outpatient Medications on File Prior to Visit  Medication Sig Dispense Refill   acetaminophen (TYLENOL) 650 MG CR tablet Take 1,300 mg by mouth 3 (three) times daily. Patient take 3 times a day.     albuterol (PROVENTIL) (2.5 MG/3ML) 0.083% nebulizer solution USE THREE MILLILITERS VIA NEBULIZATION BY MOUTH EVERY 6 HOURS AS NEEDED FOR WHEEZING OR SHORTNESS OF BREATH (Patient taking differently: Take 2.5 mg by nebulization every 6 (six) hours as needed for wheezing or shortness of breath.) 75 mL 3   albuterol (VENTOLIN HFA) 108 (90 Base) MCG/ACT inhaler INHALE 1 PUFF EVERY 4 HOURS AS NEEDED FOR WHEEZING, SHORTNESS OF BREATH (RESCUE INHALER IF ADVAIR NOT WORKING) 2 each 2   Alcohol Swabs (B-D SINGLE USE SWABS REGULAR)  PADS USE  FOR  TESTING THREE TIMES DAILY 300 each 1   amLODipine (NORVASC) 10 MG tablet TAKE 1 TABLET EVERY DAY 90 tablet 3   aspirin 81 MG tablet Take 81 mg by mouth daily with breakfast.     Blood Glucose Calibration (ACCU-CHEK SMARTVIEW CONTROL) LIQD      blood glucose meter kit and supplies KIT Dispense based on patient and insurance preference. Use up to four times daily as directed. Dx:E11.9 1 each 3   calcium carbonate (OSCAL) 1500 (600 Ca) MG TABS tablet Take 600 mg of elemental calcium by mouth daily with breakfast.     carvedilol (COREG) 12.5 MG tablet Take 1 tablet (12.5 mg total) by mouth 2 (two) times daily. 60 tablet 3   cholecalciferol (VITAMIN D3) 25 MCG (1000 UNIT) tablet Take 1,000 Units by mouth daily with breakfast.     clopidogrel (PLAVIX) 75 MG tablet TAKE 1 TABLET EVERY DAY 90 tablet 1   diclofenac Sodium (VOLTAREN) 1 % GEL APPLY TWO GRAMS TOPICALLY TO THE AFFECTED AREA 4 TIMES DAILY 300 g 3   DROPLET PEN NEEDLES 31G X 8 MM MISC USE AS DIRECTED WITH LANTUS SOLOSTAR PEN 100 each 4   enalapril (VASOTEC) 20 MG tablet TAKE 1 TABLET TWICE DAILY (Patient taking differently: Take 20 mg by mouth  2 (two) times daily.) 180 tablet 3   fenofibrate micronized (LOFIBRA) 134 MG capsule TAKE 1 CAPSULE EVERY DAY 90 capsule 3   ferrous sulfate 325 (65 FE) MG tablet Twice a week (Patient taking differently: Take 325 mg by mouth 2 (two) times a week.) 18 tablet 3   Fluticasone-Salmeterol (ADVAIR) 250-50 MCG/DOSE AEPB INHALE 1 PUFF TWICE DAILY (Patient taking differently: Inhale 1 puff into the lungs in the morning and at bedtime.) 975 each 3   folic acid (FOLVITE) 883 MCG tablet Take 400 mcg by mouth daily with breakfast.     furosemide (LASIX) 20 MG tablet TAKE 1 TABLET EVERY DAY 90 tablet 2   hydrALAZINE (APRESOLINE) 25 MG tablet TAKE ONE TABLET BY MOUTH EVERY MORNING AS NEEDED FOR BLOOD PRESSURE 90 tablet 0   hydrALAZINE (APRESOLINE) 50 MG tablet Take 1 tablet (50 mg total) by mouth in the  morning and at bedtime. Patient is overdue for appointment. Patient must call our office and schedule an appointment for any further refills 180 tablet 0   icosapent Ethyl (VASCEPA) 1 g capsule TAKE 2 CAPSULES TWICE DAILY 360 capsule 0   lamoTRIgine (LAMICTAL) 25 MG tablet TAKE ONE TABLET BY MOUTH AT BEDTIME (Patient taking differently: 2 (two) times daily.) 90 tablet 0   Lancets Misc. (ACCU-CHEK FASTCLIX LANCET) KIT 1 Device by Does not apply route. Use as directed for testing blood sugar     Lancets Misc. (ACCU-CHEK FASTCLIX LANCET) KIT Use as directed to test blood sugar 1 kit 1   LANTUS SOLOSTAR 100 UNIT/ML Solostar Pen INJECT  17  TO  20 UNITS SUBCUTANEOUSLY EVERY MORNING (Patient taking differently: Inject 18 Units into the skin 2 (two) times daily.) 30 mL 5   loratadine (CLARITIN) 10 MG tablet Take 10 mg by mouth every other day.     Melatonin 5 MG CAPS Take 5 mg by mouth at bedtime.     metFORMIN (GLUCOPHAGE-XR) 500 MG 24 hr tablet Take 1 tablet (500 mg total) by mouth daily with breakfast. 90 tablet 3   nitroGLYCERIN (NITROSTAT) 0.4 MG SL tablet DISSOLVE 1 TAB UNDER TONGUE FOR CHEST PAIN - IF PAIN REMAINS AFTER 5 MIN, CALL 911 AND REPEAT DOSE. MAX 3 TABS IN 15 MINUTES 25 tablet 6   pantoprazole (PROTONIX) 40 MG tablet TAKE 1 TABLET EVERY DAY 90 tablet 1   rosuvastatin (CRESTOR) 40 MG tablet TAKE 1 TABLET EVERY DAY 90 tablet 0   sertraline (ZOLOFT) 50 MG tablet Take 2 tablets (100 mg total) by mouth at bedtime. 180 tablet 2   SPIRIVA HANDIHALER 18 MCG inhalation capsule PLACE 1 CAPSULE INTO HANDIHALER AND INHALE DAILY 90 capsule 2   spironolactone (ALDACTONE) 25 MG tablet TAKE 1 TABLET (25 MG TOTAL) BY MOUTH DAILY. 90 tablet 3   TRUE METRIX BLOOD GLUCOSE TEST test strip TEST BLOOD SUGAR  UP  TO FOUR TIMES DAILY AS DIRECTED 400 strip 3   TRUEplus Lancets 33G MISC TEST BLOOD SUGAR UP TO FOUR TIMES DAILY AS DIRECTED 400 each 3   vitamin B-12 (CYANOCOBALAMIN) 1000 MCG tablet Take 1,000 mcg by  mouth daily with breakfast.     vitamin E 400 UNIT capsule Take 400 Units by mouth at bedtime.     No current facility-administered medications on file prior to visit.     Observations/Objective:   Vitals:   08/19/21 1303  Weight: 193 lb (87.5 kg)  Height: '5\' 5"'  (1.651 m)   GEN:  The patient appears stated age and is  in NAD.  Neurological examination: Patient is awake, alert. No aphasia or dysarthria. Intact fluency and comprehension. Cranial nerves: Extraocular movements intact with no nystagmus. No facial asymmetry. Motor: moves all extremities symmetrically, at least anti-gravity x 4. No incoordination on finger to nose testing. Gait: slow and cautious with walker, reporting bilateral leg pain.    Assessment and Plan:   This is an 88 year old right-handed woman with a history of hypertension, hyperlipidemia, diabetes, CAD, 1st degree heart block, vascular dementia, with left temporal lobe epilepsy. No further episodes of loss of consciousness since January 2022, no episodes of unresponsiveness since June 2022. She is on low dose Lamotrigine 46m BID, continue current dose. There have been a few times of confusion that appear more likely related to dementia. MMSE 20/30 in July 2022. Continue 24/7 care. She does not drive. Follow-up in 6 months, call for any changes.    Follow Up Instructions:   -I discussed the assessment and treatment plan with the patient. The patient was provided an opportunity to ask questions and all were answered. The patient agreed with the plan and demonstrated an understanding of the instructions.   The patient was advised to call back or seek an in-person evaluation if the symptoms worsen or if the condition fails to improve as anticipated.     KCameron Sprang MD

## 2021-08-20 ENCOUNTER — Ambulatory Visit: Payer: Medicare HMO | Admitting: Neurology

## 2021-08-29 ENCOUNTER — Other Ambulatory Visit: Payer: Self-pay | Admitting: Family Medicine

## 2021-09-01 ENCOUNTER — Other Ambulatory Visit: Payer: Self-pay | Admitting: Gynecologic Oncology

## 2021-09-01 DIAGNOSIS — R9389 Abnormal findings on diagnostic imaging of other specified body structures: Secondary | ICD-10-CM

## 2021-09-01 DIAGNOSIS — N9489 Other specified conditions associated with female genital organs and menstrual cycle: Secondary | ICD-10-CM

## 2021-09-06 ENCOUNTER — Other Ambulatory Visit: Payer: Self-pay | Admitting: Cardiovascular Disease

## 2021-09-06 NOTE — Telephone Encounter (Signed)
Rx(s) sent to pharmacy electronically.  

## 2021-09-08 ENCOUNTER — Other Ambulatory Visit: Payer: Self-pay | Admitting: Family Medicine

## 2021-09-08 DIAGNOSIS — I2510 Atherosclerotic heart disease of native coronary artery without angina pectoris: Secondary | ICD-10-CM

## 2021-09-16 ENCOUNTER — Other Ambulatory Visit: Payer: Self-pay

## 2021-09-16 ENCOUNTER — Ambulatory Visit (HOSPITAL_BASED_OUTPATIENT_CLINIC_OR_DEPARTMENT_OTHER): Payer: Medicare HMO | Admitting: Cardiovascular Disease

## 2021-09-16 ENCOUNTER — Encounter (HOSPITAL_BASED_OUTPATIENT_CLINIC_OR_DEPARTMENT_OTHER): Payer: Self-pay | Admitting: Cardiovascular Disease

## 2021-09-16 VITALS — BP 146/79 | Ht 65.0 in | Wt 193.8 lb

## 2021-09-16 DIAGNOSIS — I5032 Chronic diastolic (congestive) heart failure: Secondary | ICD-10-CM | POA: Diagnosis not present

## 2021-09-16 DIAGNOSIS — E1169 Type 2 diabetes mellitus with other specified complication: Secondary | ICD-10-CM

## 2021-09-16 DIAGNOSIS — R5381 Other malaise: Secondary | ICD-10-CM

## 2021-09-16 DIAGNOSIS — I10 Essential (primary) hypertension: Secondary | ICD-10-CM

## 2021-09-16 DIAGNOSIS — E785 Hyperlipidemia, unspecified: Secondary | ICD-10-CM

## 2021-09-16 DIAGNOSIS — I2510 Atherosclerotic heart disease of native coronary artery without angina pectoris: Secondary | ICD-10-CM | POA: Diagnosis not present

## 2021-09-16 MED ORDER — HYDRALAZINE HCL 50 MG PO TABS: 50.0000 mg | ORAL_TABLET | Freq: Three times a day (TID) | ORAL | 3 refills | Status: DC

## 2021-09-16 NOTE — Assessment & Plan Note (Signed)
Lipids are poorly controlled.  Agree with adding Vascepa.  Continue fenofibrate and rosuvastatin.  She is on maximal doses of these medications.  We will order home PT and encouraged her to work on her diet and exercise. ?

## 2021-09-16 NOTE — Assessment & Plan Note (Signed)
She is euvolemic and doing well.  Continue current regimen. ?

## 2021-09-16 NOTE — Patient Instructions (Signed)
Medication Instructions:  ?INCREASE YOUR CARVEDILOL TO 25 MG TWICE A DAY  ? ?INCREASE YOUR HYDRALAZINE TO 50 MG THREE TIMES A DAY  ? ?Labwork: ?NONE ? ?Testing/Procedures: ?NONE ? ?Follow-Up: ?10/18/2021  ? ?Any Other Special Instructions Will Be Listed Below (If Applicable). ? ?You have been referred to home physical therapy If you do not hear from them in 2 weeks please call the office to follow up  ? ? ?If you need a refill on your cardiac medications before your next appointment, please call your pharmacy. ? ? ?

## 2021-09-16 NOTE — Assessment & Plan Note (Signed)
Stable.  She has no exertional symptoms, though she is not really getting much exercise.  Continue aspirin, carvedilol, clopidogrel, fenofibrate, Vascepa. ?

## 2021-09-16 NOTE — Progress Notes (Unsigned)
Advanced Hypertension Clinic Follow-up:    Date:  09/16/2021   ID:  Michelle [name], DOB Jul 12, 1933, MRN [id number]  PCP:  Michelle Olp, MD  Cardiologist:  Michelle Chandler, MD  Nephrologist:  Referring MD: Michelle Olp, MD   CC: Hypertension  History of Present Illness:    Michelle [name] is a 88 y.o. female with a hx of CAD status post angioplasty of the RCA in 1990 and inferior STEMI 02/2014, diabetes, hypertension, hyperlipidemia, prior tobacco abuse, and chronic diastolic heart failure here for follow-up. She initially established care in the hypertension clinic 09/03/2020. She was admitted for an inferior STEMI 02/2014 and had a completely occluded RCA with an aneurysm that formed prior to the angioplasty site the aneurysmal segment was treated with 2 covered stents.  She did have a small pericardial effusion post MI.  Follow-up echo revealed resolution of her pericardial effusion and normal systolic function.  She was last seen by [name] cardiology 05/2019.  At that appointment her blood pressure was 138/62 and she was doing well.  At some point metoprolol was started instead of carvedilol 12.40m.  Ms. PColclasurewas admitted 08/2020 with confusion.  Her son found her in the bed unresponsive with her jaws clenched.  Her BP at presentation was 186/81.  She is thought to have hypertensive encephalopathy and delirium.  Head CT was unremarkable.  Her symptoms improved with better BP control.  Her home amlodipine, enalapril, and spironolactone were continued.  Metoprolol was increased and hydralazine was added.  Her dose of furosemide was reduced due to concern for dehydration. Her blood pressure was controlled at her visit on 08/2020.  Cortisol was within normal limits.  Renal artery Dopplers were - 07/2020.  She is accompanied by her son. Today, she is suffering from knee pain that she attributes to her arthritis. In a typical day she is mostly wheelchair-bound, limited by her knees. Her LE  edema worsens when she is on her feet for longer times. They present a blood pressure log showing averages of 130s-140s, with other readings as low as the 110s and as high as the 160s. She believes the lower readings are usually at night when she is lying down. Sometimes she will sit and rest 5 minutes before checking her BP. Normally she takes Amlodipine at 7-8 AM, and she takes her evening medication at 8-8:30 PM. She has also been taking an extra dose of hydralazine daily around  2-2:30 PM. She continues to be conscientious of her salt intake. They have not noticed any clear correlations between sodium and her blood pressure readings. Lately she is not formally exercising. It has been a year since she was last in PT. Her son notes that transportation is often difficult. Additionally, she complains of memory loss that she believes is related to a previous seizure. She denies any palpitations, chest pain, or shortness of breath. No lightheadedness, headaches, syncope, orthopnea, or PND.   Past Medical History:  Diagnosis Date   Anginal pain ([facility]    Arthritis    AV block, 1st degree    CAD (coronary artery disease) 1990; 2015   Cardiac cath 1990 with Dr. SLia Foyerand [name] Residual CAD, non obstructive per 2015 cath. STEMI status post stent in August 2015.   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 02/18/14   Promus DES to RCA with STEMI   Shortness of breath     Past Surgical History:  Procedure Laterality Date   CATARACT EXTRACTION  2020    right and left eye    CHOLECYSTECTOMY     thinks her appendix was removed at the same time   [place]  02/18/14   Promus DES to RCA   [place] N/A 02/18/2014   Procedure: [place];  Surgeon: Michelle Booze, MD;  Location: [name] CATH LAB;  Service: Cardiovascular;  Laterality: N/A;   PTCA  1990   PTCA of RCA   TRANSTHORACIC ECHOCARDIOGRAM  02/02/2012   mild LVH, EF 55-60%, Normal WM, Gr 1 DD; Mild MR    Current Medications: Current Meds  Medication Sig   acetaminophen (TYLENOL) 650 MG CR tablet Take 1,300 mg by mouth 3 (three) times daily. Patient take 3 times a day.   albuterol (PROVENTIL) (2.5 MG/3ML) 0.083% nebulizer solution USE THREE MILLILITERS VIA NEBULIZATION BY MOUTH EVERY 6 HOURS AS NEEDED FOR WHEEZING OR SHORTNESS OF BREATH (Patient taking differently: Take 2.5 mg by nebulization every 6 (six) hours as needed for wheezing or shortness of breath.)   albuterol (VENTOLIN HFA) 108 (90 Base) MCG/ACT inhaler INHALE 1 PUFF EVERY 4 HOURS AS NEEDED FOR WHEEZING, SHORTNESS OF BREATH (RESCUE INHALER IF ADVAIR NOT WORKING)   Alcohol Swabs (DROPSAFE ALCOHOL PREP) 70 % PADS USE  FOR  TESTING THREE TIMES DAILY   amLODipine (NORVASC) 10 MG tablet TAKE 1 TABLET EVERY DAY   aspirin 81 MG tablet Take 81 mg by mouth daily with breakfast.   Blood Glucose Calibration (ACCU-CHEK SMARTVIEW CONTROL) LIQD    blood glucose meter kit and supplies KIT Dispense based on patient and insurance preference. Use up to four times daily as directed. Dx:E11.9   calcium carbonate (OSCAL) 1500 (600 Ca) MG TABS tablet Take 600 mg of elemental calcium by mouth daily with breakfast.   carvedilol (COREG) 12.5 MG tablet TAKE 1 TABLET TWICE DAILY   cholecalciferol (VITAMIN D3) 25 MCG (1000 UNIT) tablet Take 1,000 Units by mouth daily with breakfast.   clopidogrel (PLAVIX) 75 MG tablet TAKE 1 TABLET EVERY DAY    diclofenac Sodium (VOLTAREN) 1 % GEL APPLY TWO GRAMS TOPICALLY TO THE AFFECTED AREA 4 TIMES DAILY   DROPLET PEN NEEDLES 31G X 8 MM MISC USE AS DIRECTED WITH LANTUS SOLOSTAR PEN   enalapril (VASOTEC) 20 MG tablet TAKE 1 TABLET TWICE DAILY (Patient taking differently: Take 20 mg by mouth 2 (two) times daily.)   fenofibrate micronized (LOFIBRA) 134 MG capsule TAKE 1 CAPSULE EVERY DAY   ferrous sulfate 325 (65 FE) MG tablet Twice a week (Patient taking differently: Take 325 mg by mouth 2 (two) times a week.)   fluticasone-salmeterol (ADVAIR) 250-50 MCG/ACT AEPB INHALE 1 PUFF TWICE DAILY   folic acid (FOLVITE) 537 MCG tablet Take 400 mcg by mouth daily with breakfast.   furosemide (LASIX) 20 MG tablet TAKE 1 TABLET EVERY DAY   hydrALAZINE (APRESOLINE) 25 MG tablet TAKE ONE TABLET BY MOUTH EVERY MORNING AS NEEDED FOR BLOOD PRESSURE   icosapent Ethyl (  VASCEPA) 1 g capsule TAKE 2 CAPSULES TWICE DAILY   lamoTRIgine (LAMICTAL) 25 MG tablet Take 1 tablet twice a day   Lancets Misc. (ACCU-CHEK FASTCLIX LANCET) KIT 1 Device by Does not apply route. Use as directed for testing blood sugar   Lancets Misc. (ACCU-CHEK FASTCLIX LANCET) KIT Use as directed to test blood sugar   LANTUS SOLOSTAR 100 UNIT/ML Solostar Pen INJECT  17  TO  20 UNITS SUBCUTANEOUSLY EVERY MORNING (Patient taking differently: Inject 18 Units into the skin 2 (two) times daily.)   loratadine (CLARITIN) 10 MG tablet Take 10 mg by mouth every other day.   Melatonin 5 MG CAPS Take 5 mg by mouth at bedtime.   metFORMIN (GLUCOPHAGE-XR) 500 MG 24 hr tablet Take 1 tablet (500 mg total) by mouth daily with breakfast.   nitroGLYCERIN (NITROSTAT) 0.4 MG SL tablet DISSOLVE 1 TAB UNDER TONGUE FOR CHEST PAIN - IF PAIN REMAINS AFTER 5 MIN, CALL 911 AND REPEAT DOSE. MAX 3 TABS IN 15 MINUTES   pantoprazole (PROTONIX) 40 MG tablet TAKE 1 TABLET EVERY DAY   rosuvastatin (CRESTOR) 40 MG tablet TAKE 1 TABLET EVERY DAY   sertraline (ZOLOFT) 50 MG tablet Take 2  tablets (100 mg total) by mouth at bedtime.   SPIRIVA HANDIHALER 18 MCG inhalation capsule PLACE 1 CAPSULE INTO HANDIHALER AND INHALE DAILY   spironolactone (ALDACTONE) 25 MG tablet TAKE 1 TABLET (25 MG TOTAL) BY MOUTH DAILY.   TRUE METRIX BLOOD GLUCOSE TEST test strip TEST BLOOD SUGAR  UP  TO FOUR TIMES DAILY AS DIRECTED   TRUEplus Lancets 33G MISC TEST BLOOD SUGAR UP TO FOUR TIMES DAILY AS DIRECTED   vitamin B-12 (CYANOCOBALAMIN) 1000 MCG tablet Take 1,000 mcg by mouth daily with breakfast.   vitamin E 400 UNIT capsule Take 400 Units by mouth at bedtime.   [DISCONTINUED] hydrALAZINE (APRESOLINE) 50 MG tablet Take 1 tablet (50 mg total) by mouth in the morning and at bedtime. Patient is overdue for appointment. Patient must call our office and schedule an appointment for any further refills     Allergies:   Codeine sulfate and Morphine sulfate   Social History   Socioeconomic History   Marital status: Single    Spouse name: Not on file   Number of children: 2   Years of education: Not on file   Highest education level: Not on file  Occupational History   Occupation: Retired    Fish farm manager: RETIRED  Tobacco Use   Smoking status: Former    Packs/day: 1.00    Years: 55.00    Pack years: 55.00    Types: Cigarettes    Quit date: 09/29/2006    Years since quitting: 14.9   Smokeless tobacco: Never  Vaping Use   Vaping Use: Never used  Substance and Sexual Activity   Alcohol use: No   Drug use: No   Sexual activity: Not Currently  Other Topics Concern   Not on file  Social History Narrative   Lives with her son but he works all day. Home for dinner.  Independent ADL. Needs assist on some IADL. Right handed    Social Determinants of Health   Financial Resource Strain: Low Risk    Difficulty of Paying Living Expenses: Not hard at all  Food Insecurity: No Food Insecurity   Worried About Charity fundraiser in the Last Year: Never true   Ran Out of Food in the Last Year: Never true   Transportation Needs: No Transportation Needs  Lack of Transportation (Medical): No   Lack of Transportation (Non-Medical): No  Physical Activity: Inactive   Days of Exercise per Week: 0 days   Minutes of Exercise per Session: 0 min  Stress: No Stress Concern Present   Feeling of Stress : Not at all  Social Connections: Socially Isolated   Frequency of Communication with Friends and Family: Once a week   Frequency of Social Gatherings with Friends and Family: Once a week   Attends Religious Services: Never   Marine scientist or Organizations: No   Attends Music therapist: Never   Marital Status: Never married     Family History: The patient's family history includes Bone cancer in her brother; Cancer in her maternal grandfather, paternal grandfather, and paternal grandmother; Cancer (age of onset: 66) in her father; Diabetes in her son and son; Heart attack in her son; Heart attack (age of onset: 97) in her mother; Hyperlipidemia in her son; Hypertension in her son; Liver cancer in her brother. There is no history of Colon cancer, Ovarian cancer, or Uterine cancer.  ROS:   Please see the history of present illness.    (+) Bilateral knee pain (+) Bilateral LE edema (+) Memory loss All other systems reviewed and are negative.  EKGs/Labs/Other Studies Reviewed:    EKG:  EKG is personally reviewed.  09/16/2021: EKG was not ordered. 08/27/20: Sinus rhythm.  Rate 73 bpm.  First-degree block.  Bilateral Renal Artery Doppler 07/29/2020: Summary:  Renal:     Right: Normal size right kidney. RRV flow present. No evidence of         right renal artery stenosis.  Left:  Normal size of left kidney. LRV flow present. No evidence of         left renal artery stenosis.     Echo 02/2014: Study Conclusions   - Left ventricle: The cavity size was normal. Systolic function was    normal. The estimated ejection fraction was in the range of 60%    to 65%. Wall motion was  normal; there were no regional wall    motion abnormalities.  - Aortic valve: There was mild to moderate regurgitation.  - Mitral valve: There was mild regurgitation.  - Left atrium: The atrium was mildly dilated.  - Pericardium, extracardiac: A trivial pericardial effusion was    identified posterior to the heart.    Recent Labs: 07/08/2021: ALT 10; BUN 26; Creatinine, Ser 1.05; Hemoglobin 12.9; Platelets 229.0; Potassium 4.2; Sodium 136   Recent Lipid Panel    Component Value Date/Time   CHOL 133 07/08/2021 1612   TRIG (H) 07/08/2021 1612    430.0 Triglyceride is over 400; calculations on Lipids are invalid.   TRIG 1693 (HH) 07/28/2006 1202   HDL 44.30 07/08/2021 1612   CHOLHDL 3 07/08/2021 1612   VLDL UNABLE TO CALCULATE IF TRIGLYCERIDE OVER 400 mg/dL 02/19/2014 0228   LDLCALC 42 02/18/2020 1529   LDLDIRECT 44.0 07/08/2021 1612    Physical Exam:    VS:  BP (!) 146/79 (BP Location: Right Arm, Patient Position: Sitting, Cuff Size: Normal)    Ht '5\' 5"'  (1.651 m)    Wt 193 lb 12.8 oz (87.9 kg)    SpO2 97%    BMI 32.25 kg/m  , BMI Body mass index is 32.25 kg/m. GENERAL:  Well appearing; No acute distress.  In wheelchair. HEENT: Pupils equal round and reactive, fundi not visualized, oral mucosa unremarkable.  NECK:  No jugular venous distention,  waveform within normal limits, carotid upstroke brisk and symmetric, no bruits LUNGS:  Clear to auscultation bilaterally HEART:  RRR.  PMI not displaced or sustained,S1 and S2 within normal limits, no S3, no S4, no clicks, no rubs, no murmurs ABD:  Central adiposity.  Positive bowel sounds normal in frequency in pitch, no bruits, no rebound, no guarding, no midline pulsatile mass, no hepatomegaly, no splenomegaly EXT:  2 plus pulses throughout, no edema, no cyanosis no clubbing SKIN:  No rashes no nodules NEURO:  Cranial nerves II through XII grossly intact, motor grossly intact throughout PSYCH:  Cognitively intact, oriented to person  place and time  ASSESSMENT:    1. Physical deconditioning   2. Essential hypertension   3. Coronary artery disease involving native coronary artery of native heart without angina pectoris   4. Hyperlipidemia associated with type 2 diabetes mellitus ([place])   5. Chronic diastolic CHF (congestive heart failure) (HCC)     PLAN:    CAD- RCA PCI '90s, STEMI-RCA DES 02/18/14 Stable.  She has no exertional symptoms, though she is not really getting much exercise.  Continue aspirin, carvedilol, clopidogrel, fenofibrate, Vascepa.  Hyperlipidemia associated with type 2 diabetes mellitus ([place]) Lipids are poorly controlled.  Agree with adding Vascepa.  Continue fenofibrate and rosuvastatin.  She is on maximal doses of these medications.  We will order home PT and encouraged her to work on her diet and exercise.  Chronic diastolic CHF (congestive heart failure) ([place]) She is euvolemic and doing well.  Continue current regimen.  Essential hypertension Blood pressure remains poorly controlled.  Secondary work-up has been unremarkable.  Continue amlodipine, carvedilol, spironolactone, enalapril, and increase hydralazine to 50 mg 3 times daily.  We will also increase the carvedilol to 50 mg 3 times daily.  I have ordered home health physical therapy to help her with exercise.  We did discuss the PREP program at the [name].  However her sons have to bring her to all of her appointments and due to her knee pain and weakness they have a hard time getting her in and out of the car.   Disposition:    FU with Ardell Aaronson C. Oval Linsey, MD, [name] in 1 month virtually.   Medication Adjustments/Labs and Tests Ordered: Current medicines are reviewed at length with the patient today.  Concerns regarding medicines are outlined above.   Orders Placed This Encounter  Procedures   Ambulatory referral to [place] ordered this encounter  Medications   hydrALAZINE (APRESOLINE) 50 MG tablet    Sig: Take 1 tablet (50  mg total) by mouth 3 (three) times daily.    Dispense:  270 tablet    Refill:  3    NEW DOSE, D/C PREVIOUS RX   I,Mathew Stumpf,acting as a scribe for Skeet Latch, MD.,have documented all relevant documentation on the behalf of Skeet Latch, MD,as directed by  Skeet Latch, MD while in the presence of Skeet Latch, MD.  I, [place] Oval Linsey, MD have reviewed all documentation for this visit.  The documentation of the exam, diagnosis, procedures, and orders on 09/16/2021 are all accurate and complete.   Signed, Skeet Latch, MD  09/16/2021 2:37 PM    [facility]

## 2021-09-16 NOTE — Assessment & Plan Note (Addendum)
Blood pressure remains poorly controlled.  Secondary work-up has been unremarkable.  Continue amlodipine, carvedilol, spironolactone, enalapril, and increase hydralazine to 50 mg 3 times daily.  We will also increase the carvedilol to 50 mg 3 times daily.  I have ordered home health physical therapy to help her with exercise.  We did discuss the PREP program at the [facility].  However her sons have to bring her to all of her appointments and due to her knee pain and weakness they have a hard time getting her in and out of the car. ?

## 2021-09-21 NOTE — Telephone Encounter (Signed)
[name], I am not sure about this pt and her referral for CT. Please send to [facility] to address with Dr. Yong Channel.  ?

## 2021-09-21 NOTE — Telephone Encounter (Signed)
See below, does pt still need this? ?

## 2021-09-21 NOTE — Telephone Encounter (Signed)
Yes she still needs this-can reorder under same indication ?

## 2021-09-22 ENCOUNTER — Other Ambulatory Visit: Payer: Self-pay

## 2021-09-22 DIAGNOSIS — J189 Pneumonia, unspecified organism: Secondary | ICD-10-CM

## 2021-09-22 NOTE — Telephone Encounter (Signed)
CT re-ordered

## 2021-09-22 NOTE — Progress Notes (Incomplete)
Phone [phone] In person visit   Subjective:   Michelle [name] is a 88 y.o. year old very pleasant female patient who presents for/with See problem oriented charting No chief complaint on file.   This visit occurred during the SARS-CoV-2 public health emergency.  Safety protocols were in place, including screening questions prior to the visit, additional usage of staff PPE, and extensive cleaning of exam room while observing appropriate contact time as indicated for disinfecting solutions.   Past Medical History-  Patient Active Problem List   Diagnosis Date Noted   Epilepsy ([place]) 03/12/2021   Adnexal mass 11/24/2020   Thickened endometrium 11/24/2020   Dementia ([place]) 10/22/2020   Delirium 06/25/2020   Actinic keratosis 11/17/2017   Anemia, iron deficiency 10/23/2015   CKD (chronic kidney disease), stage III ([place]) 07/25/2014   Osteoarthritis, knee 05/13/2014   Anxiety state 04/21/2014   Chronic diastolic CHF (congestive heart failure) ([place]) 03/13/2014   SOB (shortness of breath) 03/05/2014   CAD- RCA PCI '90s, STEMI-RCA DES 02/18/14 03/05/2014   Back pain, lumbosacral 10/10/2012   Type 2 diabetes mellitus without complication, with long-term current use of insulin ([place]) 03/26/2007   Hyperlipidemia associated with type 2 diabetes mellitus ([place]) 03/26/2007   Essential hypertension 04/21/2006   COPD (chronic obstructive pulmonary disease) ([place]) 04/21/2006    Medications- reviewed and updated Current Outpatient Medications  Medication Sig Dispense Refill   acetaminophen (TYLENOL) 650 MG CR tablet Take 1,300 mg by mouth 3 (three) times daily. Patient take 3 times a day.     albuterol (PROVENTIL) (2.5 MG/3ML) 0.083% nebulizer solution USE THREE MILLILITERS VIA NEBULIZATION BY MOUTH EVERY 6 HOURS AS NEEDED FOR WHEEZING OR SHORTNESS OF BREATH (Patient taking differently: Take 2.5 mg by nebulization every 6 (six) hours as needed for wheezing or shortness of breath.) 75 mL 3    albuterol (VENTOLIN HFA) 108 (90 Base) MCG/ACT inhaler INHALE 1 PUFF EVERY 4 HOURS AS NEEDED FOR WHEEZING, SHORTNESS OF BREATH (RESCUE INHALER IF ADVAIR NOT WORKING) 2 each 2   Alcohol Swabs (DROPSAFE ALCOHOL PREP) 70 % PADS USE  FOR  TESTING THREE TIMES DAILY 300 each 1   amLODipine (NORVASC) 10 MG tablet TAKE 1 TABLET EVERY DAY 90 tablet 3   aspirin 81 MG tablet Take 81 mg by mouth daily with breakfast.     Blood Glucose Calibration (ACCU-CHEK SMARTVIEW CONTROL) LIQD      blood glucose meter kit and supplies KIT Dispense based on patient and insurance preference. Use up to four times daily as directed. Dx:E11.9 1 each 3   calcium carbonate (OSCAL) 1500 (600 Ca) MG TABS tablet Take 600 mg of elemental calcium by mouth daily with breakfast.     carvedilol (COREG) 12.5 MG tablet Take 25 mg by mouth 2 (two) times daily with a meal.     cholecalciferol (VITAMIN D3) 25 MCG (1000 UNIT) tablet Take 1,000 Units by mouth daily with breakfast.     clopidogrel (PLAVIX) 75 MG tablet TAKE 1 TABLET EVERY DAY 90 tablet 1   diclofenac Sodium (VOLTAREN) 1 % GEL APPLY TWO GRAMS TOPICALLY TO THE AFFECTED AREA 4 TIMES DAILY 300 g 3   DROPLET PEN NEEDLES 31G X 8 MM MISC USE AS DIRECTED WITH LANTUS SOLOSTAR PEN 100 each 4   enalapril (VASOTEC) 20 MG tablet TAKE 1 TABLET TWICE DAILY (Patient taking differently: Take 20 mg by mouth 2 (two) times daily.) 180 tablet 3   fenofibrate micronized (LOFIBRA) 134 MG capsule TAKE 1 CAPSULE  EVERY DAY 90 capsule 3   ferrous sulfate 325 (65 FE) MG tablet Twice a week (Patient taking differently: Take 325 mg by mouth 2 (two) times a week.) 18 tablet 3   fluticasone-salmeterol (ADVAIR) 250-50 MCG/ACT AEPB INHALE 1 PUFF TWICE DAILY 903 each 3   folic acid (FOLVITE) 009 MCG tablet Take 400 mcg by mouth daily with breakfast.     furosemide (LASIX) 20 MG tablet TAKE 1 TABLET EVERY DAY 90 tablet 2   hydrALAZINE (APRESOLINE) 25 MG tablet TAKE ONE TABLET BY MOUTH EVERY MORNING AS NEEDED FOR  BLOOD PRESSURE 90 tablet 0   hydrALAZINE (APRESOLINE) 50 MG tablet Take 1 tablet (50 mg total) by mouth 3 (three) times daily. 270 tablet 3   icosapent Ethyl (VASCEPA) 1 g capsule TAKE 2 CAPSULES TWICE DAILY 360 capsule 0   lamoTRIgine (LAMICTAL) 25 MG tablet Take 1 tablet twice a day 180 tablet 3   Lancets Misc. (ACCU-CHEK FASTCLIX LANCET) KIT 1 Device by Does not apply route. Use as directed for testing blood sugar     Lancets Misc. (ACCU-CHEK FASTCLIX LANCET) KIT Use as directed to test blood sugar 1 kit 1   LANTUS SOLOSTAR 100 UNIT/ML Solostar Pen INJECT  17  TO  20 UNITS SUBCUTANEOUSLY EVERY MORNING (Patient taking differently: Inject 18 Units into the skin 2 (two) times daily.) 30 mL 5   loratadine (CLARITIN) 10 MG tablet Take 10 mg by mouth every other day.     Melatonin 5 MG CAPS Take 5 mg by mouth at bedtime.     metFORMIN (GLUCOPHAGE-XR) 500 MG 24 hr tablet Take 1 tablet (500 mg total) by mouth daily with breakfast. 90 tablet 3   nitroGLYCERIN (NITROSTAT) 0.4 MG SL tablet DISSOLVE 1 TAB UNDER TONGUE FOR CHEST PAIN - IF PAIN REMAINS AFTER 5 MIN, CALL 911 AND REPEAT DOSE. MAX 3 TABS IN 15 MINUTES 25 tablet 6   pantoprazole (PROTONIX) 40 MG tablet TAKE 1 TABLET EVERY DAY 90 tablet 1   rosuvastatin (CRESTOR) 40 MG tablet TAKE 1 TABLET EVERY DAY 90 tablet 0   sertraline (ZOLOFT) 50 MG tablet Take 2 tablets (100 mg total) by mouth at bedtime. 180 tablet 2   SPIRIVA HANDIHALER 18 MCG inhalation capsule PLACE 1 CAPSULE INTO HANDIHALER AND INHALE DAILY 90 capsule 2   spironolactone (ALDACTONE) 25 MG tablet TAKE 1 TABLET (25 MG TOTAL) BY MOUTH DAILY. 90 tablet 3   TRUE METRIX BLOOD GLUCOSE TEST test strip TEST BLOOD SUGAR  UP  TO FOUR TIMES DAILY AS DIRECTED 400 strip 3   TRUEplus Lancets 33G MISC TEST BLOOD SUGAR UP TO FOUR TIMES DAILY AS DIRECTED 400 each 3   vitamin B-12 (CYANOCOBALAMIN) 1000 MCG tablet Take 1,000 mcg by mouth daily with breakfast.     vitamin E 400 UNIT capsule Take 400 Units  by mouth at bedtime.     No current facility-administered medications for this visit.     Objective:  There were no vitals taken for this visit. Gen: NAD, resting comfortably CV: RRR no murmurs rubs or gallops Lungs: CTAB no crackles, wheeze, rhonchi Abdomen: soft/nontender/nondistended/normal bowel sounds. No rebound or guarding.  Ext: no edema Skin: warm, dry Neuro: grossly normal, moves all extremities  ***    Assessment and Plan   ***cardiology wants cortisol *** Pt stated she is in too much pain to exercise. I requested resources to aid in a ramp for transfers. Pt also stated she has a very old walker. Pt has appt  06/15/21 @ 3 p.m   #memory concern- upcoming visit with Dr. Delice Lesch next year   #Palliative care-patient continued to work with palliative care for [facility]. Maudie Mercury- NP with palliative- had leaned towards not moving forward with surgery -=Had a hard time getting in and out of vehicles and in and out of house- basically pulling truck into up to front door- son had to pick her up due to knee pain   #Surgery discussion- Patient presented 06/2021 visit -wanted to discuss about surgery for uterine mass-potential hysterectomy- she does not want to have this done- but GYN really wanted her to have this done- this could certainly be cancer. She was also really worried about anesthesia   # Cough in patient with COPD S:Patient presented with having a cough with little production . Still using advair and spiriva (occasionally takes a break). Albuterol was helpful some. OTC cough syrup which was safe for heart per labeling had been mild to moderately helpful. Cough worsened in last few weeks- mainly dry. Cough fluctuated. Some wheeze A/P: ***   # Anxiety S:Medication: Zoloft 100 mg at bedtime - occasionally had increased anxiety and was shaky and couldn't explain how she felt otherwise but for most part pretty controlled. She stated burning sensation in arm and felt  somewhat hot all over- maybe once a month A/P: ***   #Memory loss/vascular dementia with periods of confusion/sundowning-likely delirium S: no recent issues with clear sundowning/confusion. Had not had issues remembered her son or who people are anymore (like sons previously).  With neurology MMSE most recently checked 20 out of 30 with MoCA 13 out of 30-thought to be vascular dementia.  -Denied low blood sugars when confusion occurred - confusion gradually improved when got off the twice daily lasix  -  I agreed with family-sounded like with mild dehydration on increased Lasix patient had increased confusion-her symptoms had resolved back on once daily Lasix 20 mg -Overall stable on 06/2021 visit.  A/P: ***  # Chronic diastolic CHF/left leg swelling S: continued to despite being on amlodipine- this may be counterbalanced by spironolactone 25 mg daily and Lasix 20 mg daily (switched to twice daily for 3 days to see if help with leg swelling but seemed dehydrated) -no signs of fluid overload- stabled to edema -weight up some and some swelling noted (weight appeared up 7 lbs but has also been the holidays). Been doing a lot of sweets/cookies/klondike bars A/P:***   #resistant hypertension S: medication:  Amlodipine 10 mg daily, lasix 20 mg daily, hydralazine 25 mg Am with 50 mg every 8 hours after, spironolactone 25 mg daily, carvedilol 12.5 mg BIDand enalapril 20 mg twice daily. Home readings #s: *** BP Readings from Last 3 Encounters:  09/16/21 (!) 146/79  07/08/21 134/78  03/17/21 118/62  A/P: ***  #CAD #hyperlipidemia S: Medication:Plavix 75 mg, Aspirin 81 mg, rosuvastatin 40 mg daily - no chest pain. Stable shortness of breath  Lab Results  Component Value Date   CHOL 133 07/08/2021   HDL 44.30 07/08/2021   LDLCALC 42 02/18/2020   LDLDIRECT 44.0 07/08/2021   TRIG (H) 07/08/2021    430.0 Triglyceride is over 400; calculations on Lipids are invalid.   CHOLHDL 3 07/08/2021    A/P: ***  # Diabetes S: Medication:Lantus 18 units plus metformin 500 mg extended release twice daily- was on 1000 mg but lowered dose due to loose stools CBGs- *** Exercise and diet- *** Lab Results  Component Value Date   HGBA1C 6.3 07/08/2021  HGBA1C 6.4 01/14/2021   HGBA1C 6.5 (H) 08/26/2020    A/P: ***  # B12 deficiency S: Current treatment/medication (oral vs. IM): patient remains on oral B-12- #s looked better last visit Lab Results  Component Value Date   [id number] 337 01/14/2021   A/P: ***   Health Maintenance Due  Topic Date Due   Zoster Vaccines- Shingrix (1 of 2) Never done   COVID-19 Vaccine (4 - Booster for Moderna series) 11/28/2020   INFLUENZA VACCINE  02/15/2021   TETANUS/TDAP  07/28/2021   FOOT EXAM  07/30/2021   Recommended follow up: No follow-ups on file. Future Appointments  Date Time Provider [place]  10/12/2021  2:15 PM [facility] CCM PHARMACIST [facility]  10/14/2021  4:00 PM [name], MD [facility]  10/18/2021 10:00 AM Skeet Latch, MD [facility]  02/21/2022  3:00 PM Rondel Jumbo, PA-C [facility] None  06/20/2022  2:30 PM [facility] HEALTH COACH [facility]  08/08/2022  1:00 PM Bernarda Caffey, MD [facility] None    Lab/Order associations: No diagnosis found.  No orders of the defined types were placed in this encounter.  I,Jada Bradford,acting as a scribe for [name], MD.,have documented all relevant documentation on the behalf of [name], MD,as directed by  [name], MD while in the presence of [name], MD.  ***  Return precautions advised.  Burnett Corrente

## 2021-09-23 ENCOUNTER — Telehealth: Payer: Self-pay | Admitting: Family Medicine

## 2021-09-23 NOTE — Telephone Encounter (Signed)
Pansy with [facility] called to state pt can not stand up for her scan. She will need to be referred to the hospital for this type of scan. ?

## 2021-09-24 ENCOUNTER — Other Ambulatory Visit: Payer: Self-pay

## 2021-09-24 DIAGNOSIS — J189 Pneumonia, unspecified organism: Secondary | ICD-10-CM

## 2021-09-24 NOTE — Telephone Encounter (Signed)
Order has been changed

## 2021-09-29 ENCOUNTER — Other Ambulatory Visit: Payer: Self-pay

## 2021-09-29 DIAGNOSIS — J189 Pneumonia, unspecified organism: Secondary | ICD-10-CM

## 2021-10-04 NOTE — Progress Notes (Signed)
? ?Chronic Care Management ?Pharmacy Note ? ?10/12/2021 ?Name:  Michelle [name] MRN:  [id number] DOB:  08/23/1932 ? ?Summary: ?PharmD Fu - spoke with son Michelle [name].  Decreased metformin not much of a noticeable effect on diarrhea.  She has had some higher glucose readings per his report.  A few > 200 after eating.  No hypoglycemia.  Did not go over full log so not sure the extent of here elevations.  Was open to increasing back to twice daily if needed - see you Thursday. ? ?Recommendations/Changes made from today's visit: ?Review glucose log - increase metformin if needed. ? ?Plan: ?FU 6 month or sooner if needed ? ? ?Subjective: ?Michelle [name] is an 88 y.o. year old female who is a primary patient of Hunter, Brayton Mars, MD.  The CCM team was consulted for assistance with disease management and care coordination needs.   ? ?Engaged with patient by telephone for follow up visit in response to provider referral for pharmacy case management and/or care coordination services.  ? ?Consent to Services:  ?The patient was given the following information about Chronic Care Management services today, agreed to services, and gave verbal consent: 1. CCM service includes personalized support from designated clinical staff supervised by the primary care provider, including individualized plan of care and coordination with other care providers 2. 24/7 contact phone numbers for assistance for urgent and routine care needs. 3. Service will only be billed when office clinical staff spend 20 minutes or more in a month to coordinate care. 4. Only one practitioner may furnish and bill the service in a calendar month. 5.The patient may stop CCM services at any time (effective at the end of the month) by phone call to the office staff. 6. The patient will be responsible for cost sharing (co-pay) of up to 20% of the service fee (after annual deductible is met). Patient agreed to services and consent obtained. ? ?Patient Care Team: ?[name], MD as PCP - General (Family Medicine) ?Burnell Blanks, MD as PCP - Cardiology (Cardiology) ?Karin Golden, MD as Consulting Physician (Dermatology) ?Bernarda Caffey, MD as Consulting Physician (Ophthalmology) ?[name], MD as Consulting Physician (Neurology) ?Elease Hashimoto (Neurology) ?Edythe Clarity, [facility] (Pharmacist) ? ?Recent office visits:  ?01/14/2021 OV (PCP) [name], MD; Diabetes has been well controlled. With loose stools we are going to hold metformin for 1 week then restart with metformin extended release 500 mg twice daily down from 1000 mg twice daily of instant release. ?  ?01/07/2021 [name], MD; acute visit for chronic cough, recommended Zyrtec nightly and trial of plain Mucinex twice daily, follow up 3-4 weeks with PCP. ?  ?Recent consult visits:  ?02/02/2021 OV (neurology) [name], MD; increase Lamotrigine 25 mg twice daily, keep a seizure calendar, follow up in 6 months. ?  ?11/17/2020 OV (gynecology) [name], MD; postmenopausal bleeding, incidental finding o a large left complex ovarian cyst 9.9 x 9.8 x 8.3 cm with septations and one solid nodule along the wall with vascularity in an 88 yo with 2 Mis, COPD, cHTN, Hypercholesterolemia, DM. Refer to Gyneco-Oncology. 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 ?  ?Hospital visits:  ?None in previous 6 months ?  ? ? ?Objective: ? ?Lab Results  ?Component Value Date  ? CREATININE 1.05 07/08/2021  ? BUN 26 (H) 07/08/2021  ? GFR 48.11 (L) 07/08/2021  ? GFRNONAA >60 09/01/2020  ? GFRAA 77 07/01/2020  ? NA 136  07/08/2021  ? K 4.2 07/08/2021  ? CALCIUM 10.1 07/08/2021  ? CO2 28 07/08/2021  ? GLUCOSE 109 (H) 07/08/2021  ? ? ?Lab Results  ?Component Value Date/Time  ? HGBA1C 6.3 07/08/2021 04:12 PM  ? HGBA1C 6.4 01/14/2021 03:15 PM  ? GFR 48.11 (L) 07/08/2021 04:12 PM  ? GFR 54.43 (L) 01/14/2021 03:15 PM  ? MICROALBUR 0.7 01/07/2014 09:43 AM  ? MICROALBUR 0.2 07/08/2013  03:12 PM  ?  ?Last diabetic Eye exam:  ?Lab Results  ?Component Value Date/Time  ? HMDIABEYEEXA No Retinopathy 08/10/2021 12:00 AM  ?  ?Last diabetic Foot exam:  ?Lab Results  ?Component Value Date/Time  ? HMDIABFOOTEX done 01/08/2014 12:00 AM  ?  ? ?Lab Results  ?Component Value Date  ? CHOL 133 07/08/2021  ? HDL 44.30 07/08/2021  ? [place] 42 02/18/2020  ? LDLDIRECT 44.0 07/08/2021  ? TRIG (H) 07/08/2021  ?  430.0 Triglyceride is over 400; calculations on Lipids are invalid.  ? CHOLHDL 3 07/08/2021  ? ? ? ?  Latest Ref Rng & Units 07/08/2021  ?  4:12 PM 01/14/2021  ?  3:15 PM 09/04/2020  ?  9:55 AM  ?Hepatic Function  ?Total Protein 6.0 - 8.3 g/dL 7.0   7.6   6.7    ?Albumin 3.5 - 5.2 g/dL 4.3   4.6   3.8    ?AST 0 - 37 U/L _0 ?ALT 0 - 35 U/L _1 ?Alk Phosphatase 39 - 117 U/L 39   39   44    ?Total Bilirubin 0.2 - 1.2 mg/dL 0.3   0.3   0.3    ? ? ?Lab Results  ?Component Value Date/Time  ? TSH 0.820 08/12/2020 06:33 PM  ? TSH 1.680 06/24/2020 10:15 PM  ? TSH 0.936 03/05/2014 02:45 PM  ? TSH 1.20 10/09/2009 08:43 AM  ? ? ? ?  Latest Ref Rng & Units 07/08/2021  ?  4:12 PM 01/14/2021  ?  3:15 PM 09/04/2020  ?  9:55 AM  ?CBC  ?WBC 4.0 - 10.5 K/uL 8.4   10.7   8.4    ?Hemoglobin 12.0 - 15.0 g/dL 12.9   12.9   12.4    ?Hematocrit 36.0 - 46.0 % 38.2   38.2   36.9    ?Platelets 150.0 - 400.0 K/uL 229.0   272.0   249.0    ? ? ?No results found for: VD25OH ? ?Clinical ASCVD: No  ?The ASCVD Risk score (Arnett DK, et al., 2019) failed to calculate for the following reasons: ?  The 2019 ASCVD risk score is only valid for ages 16 to 64   ? ? ?  06/07/2021  ?  2:46 PM 03/12/2021  ?  4:16 PM 01/14/2021  ?  2:22 PM  ?Depression screen PHQ 2/9  ?Decreased Interest 0 0 0  ?Down, Depressed, Hopeless 0 0 0  ?PHQ - 2 Score 0 0 0  ?  ? ?Social History  ? ?Tobacco Use  ?Smoking Status Former  ? Packs/day: 1.00  ? Years: 55.00  ? Pack years: 55.00  ? Types: Cigarettes  ? Quit date: 09/29/2006  ? Years since quitting:  15.0  ?Smokeless Tobacco Never  ? ?BP Readings from Last 3 Encounters:  ?09/16/21 (!) 146/79  ?07/08/21 134/78  ?03/17/21 118/62  ? ?Pulse Readings from Last 3 Encounters:  ?07/08/21 86  ?03/17/21 74  ?03/12/21  78  ? ?Wt Readings from Last 3 Encounters:  ?09/16/21 193 lb 12.8 oz (87.9 kg)  ?08/19/21 193 lb (87.5 kg)  ?07/08/21 195 lb 9.6 oz (88.7 kg)  ? ?BMI Readings from Last 3 Encounters:  ?09/16/21 32.25 kg/m?  ?08/19/21 32.12 kg/m?  ?07/08/21 32.55 kg/m?  ? ? ?Assessment/Interventions: Review of patient past medical history, allergies, medications, health status, including review of consultants reports, laboratory and other test data, was performed as part of comprehensive evaluation and provision of chronic care management services.  ? ?SDOH:  (Social Determinants of Health) assessments and interventions performed: Yes ? ?Financial Resource Strain: Low Risk   ? Difficulty of Paying Living Expenses: Not hard at all  ? ? ?SDOH Screenings  ? ?Alcohol Screen: Not on file  ?Depression (PHQ2-9): Low Risk   ? PHQ-2 Score: 0  ?Financial Resource Strain: Low Risk   ? Difficulty of Paying Living Expenses: Not hard at all  ?Food Insecurity: No Food Insecurity  ? Worried About Charity fundraiser in the Last Year: Never true  ? Ran Out of Food in the Last Year: Never true  ?Housing: Low Risk   ? Last Housing Risk Score: 0  ?Physical Activity: Inactive  ? Days of Exercise per Week: 0 days  ? Minutes of Exercise per Session: 0 min  ?Social Connections: Socially Isolated  ? Frequency of Communication with Friends and Family: Once a week  ? Frequency of Social Gatherings with Friends and Family: Once a week  ? Attends Religious Services: Never  ? Active Member of Clubs or Organizations: No  ? Attends Archivist Meetings: Never  ? Marital Status: Never married  ?Stress: No Stress Concern Present  ? Feeling of Stress : Not at all  ?Tobacco Use: Medium Risk  ? Smoking Tobacco Use: Former  ? Smokeless Tobacco Use: Never   ? Passive Exposure: Not on file  ?Transportation Needs: No Transportation Needs  ? Lack of Transportation (Medical): No  ? Lack of Transportation (Non-Medical): No  ? ? ?CCM Care Plan ? ?Allergies  ?Aller

## 2021-10-06 ENCOUNTER — Other Ambulatory Visit: Payer: Self-pay | Admitting: Family Medicine

## 2021-10-12 ENCOUNTER — Other Ambulatory Visit: Payer: Self-pay | Admitting: Family Medicine

## 2021-10-12 ENCOUNTER — Ambulatory Visit (INDEPENDENT_AMBULATORY_CARE_PROVIDER_SITE_OTHER): Payer: Medicare HMO | Admitting: Pharmacist

## 2021-10-12 DIAGNOSIS — I10 Essential (primary) hypertension: Secondary | ICD-10-CM

## 2021-10-12 DIAGNOSIS — Z794 Long term (current) use of insulin: Secondary | ICD-10-CM

## 2021-10-12 NOTE — Patient Instructions (Addendum)
Visit Information ? ? Goals Addressed   ? ?  ?  ?  ?  ? This Visit's Progress  ?  Monitor and Manage My Blood Sugar-Diabetes Type 2   On track  ?  Timeframe:  Long-Range Goal ?Priority:  High ?Start Date: 03/30/21                            ?Expected End Date:09/27/20                      ? ?Follow Up Date 06/28/21 ?  ?- check blood sugar at prescribed times ?- check blood sugar if I feel it is too high or too low ?- take the blood sugar log to all doctor visits  ?  ?Why is this important?   ?Checking your blood sugar at home helps to keep it from getting very high or very low.  ?Writing the results in a diary or log helps the doctor know how to care for you.  ?Your blood sugar log should have the time, date and the results.  ?Also, write down the amount of insulin or other medicine that you take.  ?Other information, like what you ate, exercise done and how you were feeling, will also be helpful.   ?  ?Notes:  ?  ? ?  ? ?Patient Care Plan: General Pharmacy (Adult)  ?  ? ?Problem Identified: HTN, HLD, CHF, COPD, HLD, Dementia   ?Priority: High  ?Onset Date: 03/30/2021  ?  ? ?Long-Range Goal: Patient-Specific Goal   ?Start Date: 03/30/2021  ?Expected End Date: 09/27/2021  ?Recent Progress: On track  ?Priority: High  ?Note:   ?Current Barriers:  ?Possible elevated glucose ? ?Pharmacist Clinical Goal(s):  ?Patient will achieve control of cough as evidenced by symptom levels through collaboration with PharmD and provider.  ? ?Interventions: ?1:1 collaboration with [name], MD regarding development and update of comprehensive plan of care as evidenced by provider attestation and co-signature ?Inter-disciplinary care team collaboration (see longitudinal plan of care) ?Comprehensive medication review performed; medication list updated in electronic medical record ? ?Hypertension (BP goal <140/90) ?-Controlled ?-Current treatment: ?Enalapril '20mg'$  daily Appropriate, Effective, Safe, Accessible ?Amlodipine '10mg'$  daily  Appropriate, Effective, Safe, Accessible ?Carvedilol 25 mg  twice daily Appropriate, Effective, Safe, Accessible ?Hydralazine '50mg'$  three times per day Appropriate, Effective, Safe, Accessible ?Spironolactone '25mg'$  daily Appropriate, Effective, Safe, Accessible ?-Medications previously tried: none noted  ?-Current home readings: 120-135/70s ?-Denies hypotensive/hypertensive symptoms ?-Educated on BP goals and benefits of medications for prevention of heart attack, stroke and kidney damage; ?Importance of home blood pressure monitoring; ?Symptoms of hypotension and importance of maintaining adequate hydration; ?-Counseled to monitor BP at home daily, document, and provide log at future appointments ?-BP seems to be well controlled, no symptoms ?-Recommended to continue current medication ? ?Update 10/12/21 ?Denies any swelling lately. ?Tolerating dose changes well. ?BP at home has been well controlled. ?Has upcoming FU with PCP, will check in office. ?No changes at this time. ? ?Diabetes (A1c goal <8%) ?-Controlled ?-Current medications: ?Metformin XR '500mg'$  once daily Appropriate, Query effective,  ?Lantus 20 units daily Appropriate, Query effective,  ?-Medications previously tried: glimepiride  ?-Current home glucose readings - denies any hypoglycemia ?-Denies hypoglycemic/hyperglycemic symptoms ?-Educated on A1c and blood sugar goals; ?Prevention and management of hypoglycemic episodes; ?-Counseled to check feet daily and get yearly eye exams ?-Recommended to continue current medication ? ?Update 10/12/21 ?Decreased metformin due to diarrhea -  she has not noticed a ton of difference per her son since decreasing her dose.  Blood sugars seem to be more elevated than they were prior to her dose change.  Denies any hypoglycemia.  Reports a few > 200 after eating.  Last A1c 6.3 which was excellent.   ?Counseled on importance of avoiding hypoglycemia - with goal of < 8.0 I would be hesitant to make any changes at this time  without seeing full BG logs.  Plant to pring to Dr. Yong Channel visit on Thursday. ?No changes at this time - evaluate at upcoming visit. ? ?Heart Failure (Goal: manage symptoms and prevent exacerbations) ?-Controlled ?-Last ejection fraction: 60-65%  ?-HF type: Diastolic ?-Current treatment: ?Carvedilol 12.'5mg'$  BID ?Furosemide '20mg'$  daily ?-Medications previously tried: none noted  ?-Current home BP/HR readings: "controlled' ?-Educated on Benefits of medications for managing symptoms and prolonging life ?Importance of weighing daily; if you gain more than 3 pounds in one day or 5 pounds in one week, contact providers ?Importance of blood pressure control ?Patient mentions cough over the past few weeks that happens throughout the day.  Used to be able to control it with cough drops.  Describes it as occasional clear mucous that she coughs up.  She is using both inhalers as prescribed.  She wants to know what she could take to help the cough.   ?-Counseled on anticholinergic effects of a lot of cold medications ?Will consult with PCP on next steps to help treat cough ? ?Patient Goals/Self-Care Activities ?Patient will:  ?- take medications as prescribed ?check glucose daily, document, and provide at future appointments ?check blood pressure daily, document, and provide at future appointments ? ?Follow Up Plan: The care management team will reach out to the patient again over the next 80 days.  ? ? ?  ? ?  ?  ? ?The patient verbalized understanding of instructions, educational materials, and care plan provided today and declined offer to receive copy of patient instructions, educational materials, and care plan.  ?Telephone follow up appointment with pharmacy team member scheduled for: 6 months ? ?Edythe Clarity, [facility]  ?Beverly Milch, PharmD ?Clinical Pharmacist  ?[facility] ?[phone] ? ?

## 2021-10-13 ENCOUNTER — Other Ambulatory Visit: Payer: Self-pay | Admitting: Family Medicine

## 2021-10-14 ENCOUNTER — Encounter: Payer: Self-pay | Admitting: Family Medicine

## 2021-10-14 ENCOUNTER — Ambulatory Visit (INDEPENDENT_AMBULATORY_CARE_PROVIDER_SITE_OTHER): Payer: Medicare HMO | Admitting: Family Medicine

## 2021-10-14 VITALS — BP 130/64 | HR 72 | Temp 98.8°F | Ht 65.0 in | Wt 195.2 lb

## 2021-10-14 DIAGNOSIS — E119 Type 2 diabetes mellitus without complications: Secondary | ICD-10-CM

## 2021-10-14 DIAGNOSIS — I5032 Chronic diastolic (congestive) heart failure: Secondary | ICD-10-CM

## 2021-10-14 DIAGNOSIS — Z794 Long term (current) use of insulin: Secondary | ICD-10-CM

## 2021-10-14 DIAGNOSIS — E785 Hyperlipidemia, unspecified: Secondary | ICD-10-CM | POA: Diagnosis not present

## 2021-10-14 DIAGNOSIS — E1169 Type 2 diabetes mellitus with other specified complication: Secondary | ICD-10-CM | POA: Diagnosis not present

## 2021-10-14 DIAGNOSIS — I2510 Atherosclerotic heart disease of native coronary artery without angina pectoris: Secondary | ICD-10-CM | POA: Diagnosis not present

## 2021-10-14 DIAGNOSIS — R413 Other amnesia: Secondary | ICD-10-CM

## 2021-10-14 DIAGNOSIS — F411 Generalized anxiety disorder: Secondary | ICD-10-CM | POA: Diagnosis not present

## 2021-10-14 DIAGNOSIS — I10 Essential (primary) hypertension: Secondary | ICD-10-CM | POA: Diagnosis not present

## 2021-10-14 MED ORDER — METFORMIN HCL ER 500 MG PO TB24: 500.0000 mg | ORAL_TABLET | Freq: Two times a day (BID) | ORAL | 3 refills | Status: DC

## 2021-10-14 NOTE — Patient Instructions (Addendum)
Please stop by lab before you go ?If you have mychart- we will send your results within 3 business days of [place] receiving them.  ?If you do not have mychart- we will call you about results within 5 business days of [place] receiving them.  ?*please also note that you will see labs on mychart as soon as they post. I will later go in and write notes on them- will say "notes from Dr. Yong Channel"  ? ?No changes today ? ?Recommended follow up: Return in about 4 months (around 02/13/2022) for physical or sooner if needed.Schedule b4 you leave. ?

## 2021-10-15 DIAGNOSIS — I110 Hypertensive heart disease with heart failure: Secondary | ICD-10-CM | POA: Diagnosis not present

## 2021-10-15 DIAGNOSIS — E1159 Type 2 diabetes mellitus with other circulatory complications: Secondary | ICD-10-CM

## 2021-10-15 DIAGNOSIS — Z794 Long term (current) use of insulin: Secondary | ICD-10-CM | POA: Diagnosis not present

## 2021-10-15 DIAGNOSIS — I5030 Unspecified diastolic (congestive) heart failure: Secondary | ICD-10-CM | POA: Diagnosis not present

## 2021-10-15 LAB — COMPREHENSIVE METABOLIC PANEL
ALT: 16 U/L (ref 0–35)
AST: 18 U/L (ref 0–37)
Albumin: 4.3 g/dL (ref 3.5–5.2)
Alkaline Phosphatase: 38 U/L — ABNORMAL LOW (ref 39–117)
BUN: 30 mg/dL — ABNORMAL HIGH (ref 6–23)
CO2: 24 mEq/L (ref 19–32)
Calcium: 10.2 mg/dL (ref 8.4–10.5)
Chloride: 100 mEq/L (ref 96–112)
Creatinine, Ser: 1.13 mg/dL (ref 0.40–1.20)
GFR: 43.97 mL/min — ABNORMAL LOW (ref >60.00)
Glucose, Bld: 137 mg/dL — ABNORMAL HIGH (ref 70–99)
Potassium: 4.6 mEq/L (ref 3.5–5.1)
Sodium: 134 mEq/L — ABNORMAL LOW (ref 135–145)
Total Bilirubin: 0.3 mg/dL (ref 0.2–1.2)
Total Protein: 7.1 g/dL (ref 6.0–8.3)

## 2021-10-15 LAB — HEMOGLOBIN A1C: Hgb A1c MFr Bld: 6.8 % — ABNORMAL HIGH (ref 4.6–6.5)

## 2021-10-18 ENCOUNTER — Telehealth (INDEPENDENT_AMBULATORY_CARE_PROVIDER_SITE_OTHER): Payer: Medicare HMO | Admitting: Cardiovascular Disease

## 2021-10-18 ENCOUNTER — Encounter (HOSPITAL_BASED_OUTPATIENT_CLINIC_OR_DEPARTMENT_OTHER): Payer: Self-pay | Admitting: Cardiovascular Disease

## 2021-10-18 DIAGNOSIS — I5032 Chronic diastolic (congestive) heart failure: Secondary | ICD-10-CM

## 2021-10-18 DIAGNOSIS — I10 Essential (primary) hypertension: Secondary | ICD-10-CM | POA: Diagnosis not present

## 2021-10-18 MED ORDER — HYDRALAZINE HCL 25 MG PO TABS: 25.0000 mg | ORAL_TABLET | Freq: Three times a day (TID) | ORAL | 3 refills | Status: DC

## 2021-10-18 NOTE — Assessment & Plan Note (Signed)
She appears to be euvolemic.  BP is better controlled but now too low.  Reduce hydralazine to 25 mg. ?

## 2021-10-18 NOTE — Patient Instructions (Addendum)
Medication Instructions:  ?HOLD YOUR BLOOD PRESSURE MEDICATIONS TODAY UNLESS YOUR BLOOD PRESSURE IS ABOVE 130  ?IF IT IS ABOVE 130 TAKE JUST HALF OF YOUR CARVEDILOL AND HYDRALAZINE THIS EVENING  ? ?STARTING TOMORROW DECREASE YOUR HYDRALAZINE TO 25 MG THREE TIMES A DAY ? ?Labwork: ?NONE ? ?Testing/Procedures: ?NONE  ? ?Follow-Up: ? ?11/02/2021 VIRTUAL VISIT AT 2:20 PM WITH DR Combined Locks  ? ? ?If you need a refill on your cardiac medications before your next appointment, please call your pharmacy. ?

## 2021-10-18 NOTE — Progress Notes (Addendum)
? ?Virtual Visit via Video Note  ? ?This visit type was conducted due to national recommendations for restrictions regarding the COVID-19 Pandemic (e.g. social distancing) in an effort to limit this patient's exposure and mitigate transmission in our community.  Due to her co-morbid illnesses, this patient is at least at moderate risk for complications without adequate follow up.  This format is felt to be most appropriate for this patient at this time.  All issues noted in this document were discussed and addressed.  A limited physical exam was performed with this format.  Please refer to the patient's chart for her consent to telehealth for [name]. ? ?The patient was identified using 2 identifiers. ? ?Patient Location: Home ?Physician Location: Office ? ?Date:  10/18/2021  ? ?ID:  Michelle [name], DOB [birth year], MRN [id number] ? ?PCP:  Michelle Olp, MD  ?Cardiologist:  Michelle Chandler, MD  ?Nephrologist: ? ?Referring MD: Michelle Olp, MD  ? ?CC: Hypertension ? ?History of Present Illness:   ? ?Michelle [name] is a 88 y.o. female with a hx of CAD status post angioplasty of the RCA in 1990 and inferior STEMI 02/2014, diabetes, hypertension, hyperlipidemia, prior tobacco abuse, and chronic diastolic heart failure here for follow-up. She initially established care in the hypertension clinic 09/03/2020. She was admitted for an inferior STEMI 02/2014 and had a completely occluded RCA with an aneurysm that formed prior to the angioplasty site the aneurysmal segment was treated with 2 covered stents.  She did have a small pericardial effusion post MI.  Follow-up echo revealed resolution of her pericardial effusion and normal systolic function.  She was last seen by [name] cardiology 05/2019.  At that appointment her blood pressure was 138/62 and she was doing well.  At some point metoprolol was started instead of carvedilol 12.48m. ? ?Ms. PMeinerswas admitted 08/2020 with confusion.  Her son found her in the  bed unresponsive with her jaws clenched.  Her BP at presentation was 186/81.  She is thought to have hypertensive encephalopathy and delirium.  Head CT was unremarkable.  Her symptoms improved with better BP control.  Her home amlodipine, enalapril, and spironolactone were continued.  Metoprolol was increased and hydralazine was added.  Her dose of furosemide was reduced due to concern for dehydration. Her blood pressure was controlled at her visit on 08/2020.  Cortisol was within normal limits.  Renal artery Dopplers were - 07/2020. ? ?She reported home blood pressures averaging within 130s to 140s but ranging from 110s to 160s. She noticed her blood pressure was highest in the evenings. Hydralazine was increased to 50 mg and carvedilol was increased to 25 mg.  ? ?Today, she is not doing well. Her son reports she feels fatigued after showering. She reports she did not sleep well last night because she slept a lot yesterday. Her blood pressure was low this morning at 59/27. Her son is trying to take her blood pressure with a new machine. Typically, her blood pressure has been labile and ranges from 120 to 140 over 60 to 80. She has taken her medications this morning. Recently, she endorses productive cough with phlegm. Her son reports she is unable to climb steps because of her bilateral knee pain and weakness. 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 She denies fever, chills, or dysuria, palpitations, chest pain, syncope, orthopnea, PND, or lower extremity edema. ? ?Past Medical History:  ?Diagnosis Date  ? Anginal pain ([place]   ? Arthritis   ?  AV block, 1st degree   ? CAD (coronary artery disease) 1990; 2015  ? Cardiac cath 1990 with Dr. Lia Foyer and [name] Residual CAD, non obstructive per 2015 cath. STEMI status post stent in August 2015.  ? COPD (chronic  obstructive pulmonary disease) ([place])   ? Diabetes mellitus type 2, insulin dependent ([place])   ? Essential hypertension   ? Hyperlipidemia with target LDL less than 70   ? Myocardial infarction [name])   ? Pericarditis-post MI (short course of steroids) 03/06/2014  ? 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 Promus DES to RCA with STEMI  ? Shortness of breath   ? ? ?Past Surgical History:  ?Procedure Laterality Date  ? CATARACT EXTRACTION  2020  ? right and left eye   ? CHOLECYSTECTOMY    ? thinks her appendix was removed at the same time  ? CORONARY ANGIOPLASTY WITH STENT PLACEMENT  02/18/14  ? Promus DES to RCA  ? LEFT HEART CATHETERIZATION WITH CORONARY ANGIOGRAM N/A 02/18/2014  ? Procedure: LEFT HEART CATHETERIZATION WITH CORONARY ANGIOGRAM;  Surgeon: Jettie Booze, MD;  Location: [facility] CATH LAB;  Service: Cardiovascular;  Laterality: N/A;  ? PTCA  1990  ? PTCA of RCA  ? TRANSTHORACIC ECHOCARDIOGRAM  02/02/2012  ? mild LVH, EF 55-60%, Normal WM, Gr 1 DD; Mild MR  ? ? ?Current Medications: ?Current Meds  ?Medication Sig  ? acetaminophen (TYLENOL) 650 MG CR tablet Take 1,300 mg by mouth 3 (three) times daily. Patient take 3 times a day.  ? albuterol (PROVENTIL) (2.5 MG/3ML) 0.083% nebulizer solution USE THREE MILLILITERS VIA NEBULIZATION BY MOUTH EVERY 6 HOURS AS NEEDED FOR WHEEZING OR SHORTNESS OF BREATH  ? albuterol (VENTOLIN HFA) 108 (90 Base) MCG/ACT inhaler INHALE 1 PUFF EVERY 4 HOURS AS NEEDED FOR WHEEZING, SHORTNESS OF BREATH (RESCUE INHALER IF ADVAIR NOT WORKING)  ? Alcohol Swabs (DROPSAFE ALCOHOL PREP) 70 % PADS USE  FOR  TESTING THREE TIMES DAILY  ? amLODipine (NORVASC) 10 MG tablet TAKE 1 TABLET EVERY DAY  ? aspirin 81 MG tablet Take 81 mg by mouth daily with breakfast.  ? Blood Glucose Calibration (ACCU-CHEK SMARTVIEW CONTROL) LIQD   ? blood glucose meter kit and supplies KIT Dispense based on patient and insurance preference. Use up to four times daily as directed.  Dx:E11.9  ? calcium carbonate (OSCAL) 1500 (600 Ca) MG TABS tablet Take 600 mg of elemental calcium by mouth daily with breakfast.  ? carvedilol (COREG) 12.5 MG tablet Take 25 mg by mouth 2 (two) times daily with a meal.  ? cholecalciferol (VITAMIN D3) 25 MCG (1000 UNIT) tablet Take 1,000 Units by mouth daily with breakfast.  ? clopidogrel (PLAVIX) 75 MG tablet TAKE 1 TABLET EVERY DAY  ? diclofenac Sodium (VOLTAREN) 1 % GEL APPLY TWO GRAMS TOPICALLY TO THE AFFECTED AREA 4 TIMES DAILY  ? DROPLET PEN NEEDLES 31G X 8 MM MISC USE AS DIRECTED WITH LANTUS SOLOSTAR PEN  ? enalapril (VASOTEC) 20 MG tablet TAKE 1 TABLET TWICE DAILY (Patient taking differently: Take 20 mg by mouth 2 (two) times daily.)  ? fenofibrate micronized (LOFIBRA) 134 MG capsule TAKE 1 CAPSULE EVERY DAY  ? ferrous sulfate 325 (65 FE) MG tablet Twice a week (Patient taking differently: Take 325 mg by mouth 2 (two) times a week.)  ?  fluticasone-salmeterol (ADVAIR) 250-50 MCG/ACT AEPB INHALE 1 PUFF TWICE DAILY  ? folic acid (FOLVITE) 287 MCG tablet Take 400 mcg by mouth daily with breakfast.  ? furosemide (LASIX) 20 MG tablet TAKE 1 TABLET EVERY DAY  ? icosapent Ethyl (VASCEPA) 1 g capsule TAKE 2 CAPSULES TWICE DAILY  ? lamoTRIgine (LAMICTAL) 25 MG tablet Take 1 tablet twice a day  ? Lancets Misc. (ACCU-CHEK FASTCLIX LANCET) KIT 1 Device by Does not apply route. Use as directed for testing blood sugar  ? Lancets Misc. (ACCU-CHEK FASTCLIX LANCET) KIT Use as directed to test blood sugar  ? LANTUS SOLOSTAR 100 UNIT/ML Solostar Pen INJECT  17  TO  20 UNITS SUBCUTANEOUSLY EVERY MORNING (Patient taking differently: Inject 18 Units into the skin 2 (two) times daily.)  ? loratadine (CLARITIN) 10 MG tablet Take 10 mg by mouth every other day.  ? Melatonin 5 MG CAPS Take 5 mg by mouth at bedtime.  ? metFORMIN (GLUCOPHAGE-XR) 500 MG 24 hr tablet Take 1 tablet (500 mg total) by mouth in the morning and at bedtime.  ? nitroGLYCERIN (NITROSTAT) 0.4 MG SL tablet  DISSOLVE 1 TAB UNDER TONGUE FOR CHEST PAIN - IF PAIN REMAINS AFTER 5 MIN, CALL 911 AND REPEAT DOSE. MAX 3 TABS IN 15 MINUTES  ? pantoprazole (PROTONIX) 40 MG tablet TAKE 1 TABLET EVERY DAY  ? rosuvastatin (CRESTOR) 4

## 2021-10-18 NOTE — Assessment & Plan Note (Addendum)
In general her BP has been better controlled.  However today her BP is very low.  We did discuss reducing hydralazine to 25 mg.  Otherwise we will continue her current medications at current doses.  Plan to hold her hydralazine and carvedilol for the rest of the day.  If her blood pressure is over 1:30 in the evening, then she can take 25 mg of hydralazine and 12.5 mg of carvedilol.  We also discussed infectious symptoms, but she does not seem to have any at this time.  Ideally, we would have her come into the office.  However her mobility is extremely limited.  The last time he took her to the PCP they were barely able to get her back in the house.  Therefore we will do another virtual visit in 2 weeks.  We will raise her blood pressure goal to 140/90.  For now, she is having some chicken broth and drinking water.  She seems to be improving clinically. ?

## 2021-10-21 ENCOUNTER — Encounter (HOSPITAL_BASED_OUTPATIENT_CLINIC_OR_DEPARTMENT_OTHER): Payer: Self-pay | Admitting: Cardiovascular Disease

## 2021-11-02 ENCOUNTER — Telehealth (HOSPITAL_BASED_OUTPATIENT_CLINIC_OR_DEPARTMENT_OTHER): Payer: Medicare HMO | Admitting: Cardiovascular Disease

## 2021-11-10 ENCOUNTER — Other Ambulatory Visit: Payer: Self-pay | Admitting: Family Medicine

## 2021-11-11 ENCOUNTER — Telehealth: Payer: Self-pay | Admitting: Cardiovascular Disease

## 2021-11-11 NOTE — Telephone Encounter (Signed)
Routed to Alvina Filbert, LPN and Dr. Oval Linsey- printed and placed on desk  ?

## 2021-11-11 NOTE — Telephone Encounter (Signed)
Pt c/o BP issue: STAT if pt c/o blurred vision, one-sided weakness or slurred speech ? ?1. What are your last 5 BP readings?  ?146/74 ?150/76 ?176/85 ?150/78 ?180/89 ? ?2. Are you having any other symptoms (ex. Dizziness, headache, blurred vision, passed out)? No symptoms ? ?3. What is your BP issue? Son is concerned that her BP is high.  States her medication was recently increased a few weeks ago.   ?

## 2021-11-12 MED ORDER — HYDRALAZINE HCL 50 MG PO TABS: 50.0000 mg | ORAL_TABLET | Freq: Three times a day (TID) | ORAL | 1 refills | Status: DC

## 2021-11-12 NOTE — Telephone Encounter (Signed)
Dr Oval Linsey reviewed, her dose was decreased 4/3 secondary to low blood pressure. Will have her increase back to Hydralazine 50 mg three times a day  ? ?Advised son of recommendations and to continue to monitor blood pressure, call back if any further issues  ?

## 2021-11-16 ENCOUNTER — Telehealth: Payer: Self-pay | Admitting: Pharmacist

## 2021-11-16 NOTE — Progress Notes (Signed)
Chronic Care Management Pharmacy Assistant   Name: Michelle [name]  MRN: [id number] DOB: Oct 20, 1932   Reason for Encounter: Diabetes Adherence Call    Recent office visits:  10/14/2021 OV (PCP) [name], MD; no medication changes indicated.  Recent consult visits:  10/18/2021 VV (Cardiology) Skeet Latch, MD;   However today her BP is very low.  We did discuss reducing hydralazine to 25 mg.  Otherwise we will continue her current medications at current doses.  Plan to hold her hydralazine and carvedilol for the rest of the day.  If her blood pressure is over 1:30 in the evening, then she can take 25 mg of hydralazine and 12.5 mg of carvedilol  Hospital visits:  None in previous 6 months  Medications: Outpatient Encounter Medications as of 11/16/2021  Medication Sig   acetaminophen (TYLENOL) 650 MG CR tablet Take 1,300 mg by mouth 3 (three) times daily. Patient take 3 times a day.   albuterol (PROVENTIL) (2.5 MG/3ML) 0.083% nebulizer solution USE THREE MILLILITERS VIA NEBULIZATION BY MOUTH EVERY 6 HOURS AS NEEDED FOR WHEEZING OR SHORTNESS OF BREATH   albuterol (VENTOLIN HFA) 108 (90 Base) MCG/ACT inhaler INHALE 1 PUFF EVERY 4 HOURS AS NEEDED FOR WHEEZING, SHORTNESS OF BREATH (RESCUE INHALER IF ADVAIR NOT WORKING)   Alcohol Swabs (DROPSAFE ALCOHOL PREP) 70 % PADS USE  FOR  TESTING THREE TIMES DAILY   amLODipine (NORVASC) 10 MG tablet TAKE 1 TABLET EVERY DAY   aspirin 81 MG tablet Take 81 mg by mouth daily with breakfast.   Blood Glucose Calibration (ACCU-CHEK SMARTVIEW CONTROL) LIQD    blood glucose meter kit and supplies KIT Dispense based on patient and insurance preference. Use up to four times daily as directed. Dx:E11.9   calcium carbonate (OSCAL) 1500 (600 Ca) MG TABS tablet Take 600 mg of elemental calcium by mouth daily with breakfast.   carvedilol (COREG) 12.5 MG tablet Take 25 mg by mouth 2 (two) times daily with a meal.   cholecalciferol (VITAMIN D3) 25 MCG  (1000 UNIT) tablet Take 1,000 Units by mouth daily with breakfast.   clopidogrel (PLAVIX) 75 MG tablet TAKE 1 TABLET EVERY DAY   diclofenac Sodium (VOLTAREN) 1 % GEL APPLY TWO GRAMS TOPICALLY TO THE AFFECTED AREA 4 TIMES DAILY   DROPLET PEN NEEDLES 31G X 8 MM MISC USE AS DIRECTED WITH LANTUS SOLOSTAR PEN   enalapril (VASOTEC) 20 MG tablet TAKE 1 TABLET TWICE DAILY   fenofibrate micronized (LOFIBRA) 134 MG capsule TAKE 1 CAPSULE EVERY DAY   ferrous sulfate 325 (65 FE) MG tablet Twice a week (Patient taking differently: Take 325 mg by mouth 2 (two) times a week.)   fluticasone-salmeterol (ADVAIR) 250-50 MCG/ACT AEPB INHALE 1 PUFF TWICE DAILY   folic acid (FOLVITE) 416 MCG tablet Take 400 mcg by mouth daily with breakfast.   furosemide (LASIX) 20 MG tablet TAKE 1 TABLET EVERY DAY   hydrALAZINE (APRESOLINE) 50 MG tablet Take 1 tablet (50 mg total) by mouth 3 (three) times daily.   icosapent Ethyl (VASCEPA) 1 g capsule TAKE 2 CAPSULES TWICE DAILY   lamoTRIgine (LAMICTAL) 25 MG tablet Take 1 tablet twice a day   Lancets Misc. (ACCU-CHEK FASTCLIX LANCET) KIT 1 Device by Does not apply route. Use as directed for testing blood sugar   Lancets Misc. (ACCU-CHEK FASTCLIX LANCET) KIT Use as directed to test blood sugar   LANTUS SOLOSTAR 100 UNIT/ML Solostar Pen INJECT  17  TO  20 UNITS SUBCUTANEOUSLY EVERY MORNING (Patient  taking differently: Inject 18 Units into the skin 2 (two) times daily.)   loratadine (CLARITIN) 10 MG tablet Take 10 mg by mouth every other day.   Melatonin 5 MG CAPS Take 5 mg by mouth at bedtime.   metFORMIN (GLUCOPHAGE-XR) 500 MG 24 hr tablet Take 1 tablet (500 mg total) by mouth in the morning and at bedtime.   nitroGLYCERIN (NITROSTAT) 0.4 MG SL tablet DISSOLVE 1 TAB UNDER TONGUE FOR CHEST PAIN - IF PAIN REMAINS AFTER 5 MIN, CALL 911 AND REPEAT DOSE. MAX 3 TABS IN 15 MINUTES   pantoprazole (PROTONIX) 40 MG tablet TAKE 1 TABLET EVERY DAY   rosuvastatin (CRESTOR) 40 MG tablet TAKE 1  TABLET EVERY DAY   sertraline (ZOLOFT) 50 MG tablet Take 2 tablets (100 mg total) by mouth at bedtime.   SPIRIVA HANDIHALER 18 MCG inhalation capsule PLACE 1 CAPSULE INTO HANDIHALER AND INHALE DAILY   spironolactone (ALDACTONE) 25 MG tablet TAKE 1 TABLET (25 MG TOTAL) BY MOUTH DAILY.   TRUE METRIX BLOOD GLUCOSE TEST test strip TEST BLOOD SUGAR  UP  TO FOUR TIMES DAILY AS DIRECTED   TRUEplus Lancets 33G MISC TEST BLOOD SUGAR UP TO FOUR TIMES DAILY AS DIRECTED   vitamin B-12 (CYANOCOBALAMIN) 1000 MCG tablet Take 1,000 mcg by mouth daily with breakfast.   vitamin E 400 UNIT capsule Take 400 Units by mouth at bedtime.   No facility-administered encounter medications on file as of 11/16/2021.   Recent Relevant Labs: Lab Results  Component Value Date/Time   HGBA1C 6.8 (H) 10/14/2021 04:34 PM   HGBA1C 6.3 07/08/2021 04:12 PM   MICROALBUR 0.7 01/07/2014 09:43 AM   MICROALBUR 0.2 07/08/2013 03:12 PM    Kidney Function Lab Results  Component Value Date/Time   CREATININE 1.13 10/14/2021 04:34 PM   CREATININE 1.05 07/08/2021 04:12 PM   CREATININE 0.98 (H) 07/03/2020 09:38 AM   CREATININE 0.81 07/01/2020 09:27 AM   GFR 43.97 (L) 10/14/2021 04:34 PM   GFRNONAA >60 09/01/2020 03:33 AM   GFRNONAA 66 07/01/2020 09:27 AM   GFRAA 77 07/01/2020 09:27 AM    Current antihyperglycemic regimen:  Metformin 500 mg twice daily Lantus 100u/mL 17-20 units daily  What recent interventions/DTPs have been made to improve glycemic control:  No recent interventions or DTPs.  Have there been any recent hospitalizations or ED visits since last visit with CPP? No  Patient denies hypoglycemic symptoms.  Patient denies hyperglycemic symptoms.  How often are you checking your blood sugar? once daily  What are your blood sugars ranging?  Fasting: 108, 125, 127, 133, 115, 137, 118  During the week, how often does your blood glucose drop below 70? Never  Are you checking your feet daily/regularly? Patient's  son [name] states he checks her feet regularly.  Adherence Review: Is the patient currently on a STATIN medication? Yes Is the patient currently on ACE/ARB medication? Yes Does the patient have >5 day gap between last estimated fill dates? No  Care Gaps: Medicare Annual Wellness: Completed 06/07/2021 Ophthalmology Exam: Next due on 10/15/2022 Hemoglobin A1C: 6.8% on 10/14/2021 Dexa Scan: Postponed Mammogram: Aged out  Future Appointments  Date Time Provider [place]  02/21/2022  3:00 PM Rondel Jumbo, PA-C [facility] None  02/22/2022  2:00 PM Skeet Latch, MD [facility]  03/22/2022  2:20 PM [name], MD [facility]  04/25/2022  3:45 PM [facility] CCM PHARMACIST [facility]  06/20/2022  2:30 PM [facility] HEALTH COACH [facility]  08/08/2022  1:00 PM  Bernarda Caffey, MD [facility] None   Star Rating Drugs: Enalapril 20 mg last filled 11/10/2021 90 DS Lantus 100u/mL last filled 09/14/2021 90 DS Rosuvastatin 40 mg last filled 10/07/2021 90 DS Metformin 500 mg last filled 10/08/2021 90 DS  April D Calhoun, [place] Pharmacist Assistant [phone]

## 2021-11-29 ENCOUNTER — Other Ambulatory Visit: Payer: Self-pay | Admitting: Family Medicine

## 2021-12-08 ENCOUNTER — Other Ambulatory Visit: Payer: Self-pay | Admitting: Cardiovascular Disease

## 2021-12-08 ENCOUNTER — Other Ambulatory Visit: Payer: Self-pay | Admitting: Family Medicine

## 2022-01-03 ENCOUNTER — Other Ambulatory Visit: Payer: Self-pay | Admitting: Family Medicine

## 2022-01-03 ENCOUNTER — Telehealth: Payer: Self-pay | Admitting: Family Medicine

## 2022-01-03 NOTE — Telephone Encounter (Signed)
Patient was called - per rudy the son  patient refuses to seek ED help or see a different md in office- patient ONLy wants to see dr hunter - patient has been sch for 6/27. -

## 2022-01-03 NOTE — Telephone Encounter (Signed)
Called and lm on pt son vm stating that with Dr. Yong Channel being out of office until Monday and with pt symptoms it is not good to make pt wait for more than a week to be seen. Even though pt is refusing ER I advised on the vm to have pt seek care at the ER based on her symptoms and with Dr. Yong Channel being out of office. I also made aware we do have a potential 4:00 slot that we could use next Thursday for pt and for son tcb if he would like this spot or has any questions.

## 2022-01-03 NOTE — Telephone Encounter (Signed)
Please schedule ov for pt to be evaluated in office.

## 2022-01-03 NOTE — Telephone Encounter (Addendum)
Pt's son states pt has been fatigue for past few days, "was in bed all weekend". States pt has not taken an at-home antigen COVID-19 test because "she doesn't go anywhere and [son] cannot leave her alone."  Pt's son decline appointment with [facility] provider.  Pt referred to PCP triage nurse.  Awaiting follow up notes.  Disconnected with son before warm transfer due to internet connectivity issues.  Patient coordinator with PCP Triage nurse states she will have a nurse call the son asap.

## 2022-01-03 NOTE — Telephone Encounter (Signed)
Patient Name: Michelle [name] Gender: Female DOB: 1933-07-03 Age: 88 Y 50 D Return Phone Number: [phone] (Primary), [phone] (Secondary), [phone] (Alternate) Address: City/ State/ Zip: [place]  [address] Client [place] [facility] [place] [facility] [place] [facility] [place] [facility] Provider [name]- MD Contact Type Call Who Is Calling Patient / Member / Family / Caregiver Call Type Triage / Clinical Caller Name Hiilani Jetter Relationship To Patient Son Return Phone Number [phone] (Secondary) Chief Complaint Fatigue (greater than THREE MONTHS old) Reason for Call Symptomatic / Request for [place] states she is Raquel Sarna calling from the front desk with a pt needing a call bk. Caller states pt has been fatigue for pass 2 days and in bed all weekend and refuse an office visit. Caller states they didn't take any covid test because they didn't go anywhere Translation No Nurse Assessment Nurse: Clovis Riley, RN, Georgina Peer Date/Time (Eastern Time): 01/03/2022 10:53:26 AM Confirm and document reason for call. If symptomatic, describe symptoms. ---Caller states his mother has been fatigued for pass 2 days and in bed all weekend and is difficult getting her out of home and into the car. Caller states she has knee pain that is chronic. No fever. States she is eating and drinking. States she just urinated. Does the patient have any new or worsening symptoms? ---Yes Will a triage be completed? ---Yes Related visit to physician within the last 2 weeks? ---No Does the PT have any chronic conditions? (i.e. diabetes, asthma, this includes High risk factors for pregnancy, etc.) ---Yes List chronic conditions. ---copd, HTN, diabetes, Is this a behavioral health or substance abuse call? ---No  Guidelines Guideline Title Affirmed Question Affirmed Notes Nurse Date/Time  (Eastern Time) Weakness (Generalized) and Fatigue [1] MODERATE weakness (i.e., interferes with work, school, normal activities) AND [2] cause unknown (Exceptions: Weakness from acute minor illness or poor fluid intake; weakness is chronic and not worse.) Clovis Riley, RN, Georgina Peer 01/03/2022 10:56:06 AM Disp. Time Eilene Ghazi Time) Disposition Final User 01/03/2022 11:02:02 AM See HCP within 4 Hours (or PCP triage) Yes Clovis Riley, RN, Leilani Merl Disagree/Comply Comply Caller Understands Yes PreDisposition InappropriateToAsk Care Advice Given Per Guideline SEE HCP (OR PCP TRIAGE) WITHIN 4 HOURS: * IF OFFICE WILL BE OPEN: You need to be seen within the next 3 or 4 hours. Call your doctor (or NP/PA) now or as soon as the office opens. CALL BACK IF: * You become worse CARE ADVICE given per Weakness and Fatigue (Adult) guideline. Referrals GO TO FACILITY REFUSED

## 2022-01-03 NOTE — Telephone Encounter (Signed)
I have spoken with Michelle [name] in regard to patients care.  Son states that it takes more than one person to get patient out of the bed and transported.    States that he does not have anyone available to assist him to get patient out for care this week.  States that patient also is refusing to see any other providers while Dr. Yong Channel is out of the office.  Also, states that patient is refusing ED or EMS.    I have cancelled the appointment scheduled with Len Blalock for next week due to [facility] stating that patient would not see Len Blalock.    Bevelyn Ngo is going to follow up with patient.   I have also suggested son looking into a provider service that makes house calls to transition patients care.

## 2022-01-05 ENCOUNTER — Telehealth: Payer: Self-pay | Admitting: Pharmacist

## 2022-01-05 DIAGNOSIS — Z515 Encounter for palliative care: Secondary | ICD-10-CM | POA: Diagnosis not present

## 2022-01-05 DIAGNOSIS — I5032 Chronic diastolic (congestive) heart failure: Secondary | ICD-10-CM | POA: Diagnosis not present

## 2022-01-05 NOTE — Progress Notes (Cosign Needed)
Chronic Care Management Pharmacy Assistant   Name: Michelle [name]  MRN: [id number] DOB: 17-Sep-1932   Reason for Encounter: Diabetes Adherence Call    Recent office visits:  None  Recent consult visits:  None  Hospital visits:  None in previous 6 months  Medications: Outpatient Encounter Medications as of 01/05/2022  Medication Sig   acetaminophen (TYLENOL) 650 MG CR tablet Take 1,300 mg by mouth 3 (three) times daily. Patient take 3 times a day.   albuterol (PROVENTIL) (2.5 MG/3ML) 0.083% nebulizer solution USE THREE MILLILITERS VIA NEBULIZATION BY MOUTH EVERY 6 HOURS AS NEEDED FOR WHEEZING OR SHORTNESS OF BREATH   albuterol (VENTOLIN HFA) 108 (90 Base) MCG/ACT inhaler INHALE 1 PUFF EVERY 4 HOURS AS NEEDED FOR WHEEZING, SHORTNESS OF BREATH (RESCUE INHALER IF ADVAIR NOT WORKING)   Alcohol Swabs (DROPSAFE ALCOHOL PREP) 70 % PADS USE  FOR  TESTING THREE TIMES DAILY   amLODipine (NORVASC) 10 MG tablet TAKE 1 TABLET EVERY DAY   aspirin 81 MG tablet Take 81 mg by mouth daily with breakfast.   Blood Glucose Calibration (ACCU-CHEK SMARTVIEW CONTROL) LIQD    blood glucose meter kit and supplies KIT Dispense based on patient and insurance preference. Use up to four times daily as directed. Dx:E11.9   calcium carbonate (OSCAL) 1500 (600 Ca) MG TABS tablet Take 600 mg of elemental calcium by mouth daily with breakfast.   carvedilol (COREG) 12.5 MG tablet Take 25 mg by mouth 2 (two) times daily with a meal.   cholecalciferol (VITAMIN D3) 25 MCG (1000 UNIT) tablet Take 1,000 Units by mouth daily with breakfast.   clopidogrel (PLAVIX) 75 MG tablet TAKE 1 TABLET EVERY DAY   diclofenac Sodium (VOLTAREN) 1 % GEL APPLY TWO GRAMS TOPICALLY TO THE AFFECTED AREA 4 TIMES DAILY   DROPLET PEN NEEDLES 31G X 8 MM MISC USE AS DIRECTED WITH LANTUS SOLOSTAR PEN   enalapril (VASOTEC) 20 MG tablet TAKE 1 TABLET TWICE DAILY   fenofibrate micronized (LOFIBRA) 134 MG capsule TAKE 1 CAPSULE EVERY DAY   ferrous  sulfate 325 (65 FE) MG tablet Twice a week (Patient taking differently: Take 325 mg by mouth 2 (two) times a week.)   fluticasone-salmeterol (ADVAIR) 250-50 MCG/ACT AEPB INHALE 1 PUFF TWICE DAILY   folic acid (FOLVITE) 400 MCG tablet Take 400 mcg by mouth daily with breakfast.   furosemide (LASIX) 20 MG tablet TAKE 1 TABLET EVERY DAY   hydrALAZINE (APRESOLINE) 50 MG tablet Take 1 tablet (50 mg total) by mouth 3 (three) times daily.   lamoTRIgine (LAMICTAL) 25 MG tablet Take 1 tablet twice a day   Lancets Misc. (ACCU-CHEK FASTCLIX LANCET) KIT 1 Device by Does not apply route. Use as directed for testing blood sugar   Lancets Misc. (ACCU-CHEK FASTCLIX LANCET) KIT Use as directed to test blood sugar   LANTUS SOLOSTAR 100 UNIT/ML Solostar Pen INJECT  17  TO  20 UNITS SUBCUTANEOUSLY EVERY MORNING   loratadine (CLARITIN) 10 MG tablet Take 10 mg by mouth every other day.   Melatonin 5 MG CAPS Take 5 mg by mouth at bedtime.   metFORMIN (GLUCOPHAGE-XR) 500 MG 24 hr tablet Take 1 tablet (500 mg total) by mouth in the morning and at bedtime.   nitroGLYCERIN (NITROSTAT) 0.4 MG SL tablet DISSOLVE 1 TAB UNDER TONGUE FOR CHEST PAIN - IF PAIN REMAINS AFTER 5 MIN, CALL 911 AND REPEAT DOSE. MAX 3 TABS IN 15 MINUTES   pantoprazole (PROTONIX) 40 MG tablet TAKE 1 TABLET EVERY  DAY   rosuvastatin (CRESTOR) 40 MG tablet TAKE 1 TABLET EVERY DAY   sertraline (ZOLOFT) 50 MG tablet TAKE 2 TABLETS AT BEDTIME   SPIRIVA HANDIHALER 18 MCG inhalation capsule PLACE 1 CAPSULE INTO HANDIHALER AND INHALE DAILY   spironolactone (ALDACTONE) 25 MG tablet TAKE 1 TABLET (25 MG TOTAL) BY MOUTH DAILY.   TRUE METRIX BLOOD GLUCOSE TEST test strip TEST BLOOD SUGAR  UP  TO FOUR TIMES DAILY AS DIRECTED   TRUEplus Lancets 33G MISC TEST BLOOD SUGAR UP TO FOUR TIMES DAILY AS DIRECTED   VASCEPA 1 g capsule TAKE 2 CAPSULES TWICE DAILY   vitamin B-12 (CYANOCOBALAMIN) 1000 MCG tablet Take 1,000 mcg by mouth daily with breakfast.   vitamin E 400 UNIT  capsule Take 400 Units by mouth at bedtime.   No facility-administered encounter medications on file as of 01/05/2022.   Recent Relevant Labs: Lab Results  Component Value Date/Time   HGBA1C 6.8 (H) 10/14/2021 04:34 PM   HGBA1C 6.3 07/08/2021 04:12 PM   MICROALBUR 0.7 01/07/2014 09:43 AM   MICROALBUR 0.2 07/08/2013 03:12 PM    Kidney Function Lab Results  Component Value Date/Time   CREATININE 1.13 10/14/2021 04:34 PM   CREATININE 1.05 07/08/2021 04:12 PM   CREATININE 0.98 (H) 07/03/2020 09:38 AM   CREATININE 0.81 07/01/2020 09:27 AM   GFR 43.97 (L) 10/14/2021 04:34 PM   GFRNONAA >60 09/01/2020 03:33 AM   GFRNONAA 66 07/01/2020 09:27 AM   GFRAA 77 07/01/2020 09:27 AM    Current antihyperglycemic regimen:  Lantus Solostar 17-20 units daily Metformin 500 mg twice daily  What recent interventions/DTPs have been made to improve glycemic control:  No recent interventions or DTPs.  Have there been any recent hospitalizations or ED visits since last visit with CPP? No  Patient denies hypoglycemic symptoms.  Patient denies hyperglycemic symptoms.  How often are you checking your blood sugar? once daily  What are your blood sugars ranging?  Fasting: 124, 128, 126, 154 with pedialyte  During the week, how often does your blood glucose drop below 70? Never  Are you checking your feet daily/regularly? Yes  Adherence Review: Is the patient currently on a STATIN medication? Yes Is the patient currently on ACE/ARB medication? Yes Does the patient have >5 day gap between last estimated fill dates? No  130/62 BP on 01/05/2022  Care Gaps: Medicare Annual Wellness: Completed 06/07/2021 Ophthalmology Exam: Next due on 10/15/2022 Hemoglobin A1C: 6.8% on 10/14/2021 Dexa Scan: Postponed Mammogram: Aged out  Future Appointments  Date Time Provider [place]  02/21/2022  3:00 PM Rondel Jumbo, PA-C [facility] None  02/22/2022  2:00 PM Skeet Latch, MD [facility]   03/22/2022  2:20 PM [name], MD [facility]  04/25/2022  3:45 PM [facility] CCM PHARMACIST [facility]  06/20/2022  2:30 PM [facility] HEALTH COACH [facility]  08/08/2022  1:00 PM Bernarda Caffey, MD [facility] None   Star Rating Drugs: Enalapril 20 mg last filled 11/10/2021 90 DS Lantus Solostar last filled 12/01/2021 90 DS Metformin 500 mg last filled 12/15/2021 90 DS Rosuvastatin 40 mg last filled 12/23/2021 90 DS  April D Calhoun, [place] Pharmacist Assistant [phone]

## 2022-01-08 ENCOUNTER — Other Ambulatory Visit: Payer: Self-pay | Admitting: Family Medicine

## 2022-01-11 ENCOUNTER — Ambulatory Visit: Payer: Medicare HMO | Admitting: Physician Assistant

## 2022-01-20 ENCOUNTER — Other Ambulatory Visit: Payer: Self-pay | Admitting: Family Medicine

## 2022-01-25 ENCOUNTER — Telehealth: Payer: Self-pay | Admitting: Pharmacist

## 2022-01-25 NOTE — Progress Notes (Signed)
Chronic Care Management Pharmacy Assistant   Name: Michelle [name]  MRN: [id number] DOB: 05/06/1933  Reason for Encounter: CHF Adherence Call    Recent office visits:  None since last adherence call  Recent consult visits:  None since last adherence call  Hospital visits:  None in previous 6 months  Medications: Outpatient Encounter Medications as of 01/25/2022  Medication Sig   acetaminophen (TYLENOL) 650 MG CR tablet Take 1,300 mg by mouth 3 (three) times daily. Patient take 3 times a day.   albuterol (PROVENTIL) (2.5 MG/3ML) 0.083% nebulizer solution USE THREE MILLILITERS VIA NEBULIZATION BY MOUTH EVERY 6 HOURS AS NEEDED FOR WHEEZING OR SHORTNESS OF BREATH   albuterol (VENTOLIN HFA) 108 (90 Base) MCG/ACT inhaler INHALE 1 PUFF EVERY 4 HOURS AS NEEDED FOR WHEEZING, SHORTNESS OF BREATH (RESCUE INHALER IF ADVAIR NOT WORKING)   Alcohol Swabs (DROPSAFE ALCOHOL PREP) 70 % PADS USE  FOR  TESTING THREE TIMES DAILY   amLODipine (NORVASC) 10 MG tablet TAKE 1 TABLET EVERY DAY   aspirin 81 MG tablet Take 81 mg by mouth daily with breakfast.   Blood Glucose Calibration (ACCU-CHEK SMARTVIEW CONTROL) LIQD    blood glucose meter kit and supplies KIT Dispense based on patient and insurance preference. Use up to four times daily as directed. Dx:E11.9   calcium carbonate (OSCAL) 1500 (600 Ca) MG TABS tablet Take 600 mg of elemental calcium by mouth daily with breakfast.   carvedilol (COREG) 12.5 MG tablet Take 25 mg by mouth 2 (two) times daily with a meal.   cholecalciferol (VITAMIN D3) 25 MCG (1000 UNIT) tablet Take 1,000 Units by mouth daily with breakfast.   clopidogrel (PLAVIX) 75 MG tablet TAKE 1 TABLET EVERY DAY   diclofenac Sodium (VOLTAREN) 1 % GEL APPLY 2 GRAMS TOPICALLY TO THE AFFECTED AREA FOUR TIMES A DAY   DROPLET PEN NEEDLES 31G X 8 MM MISC USE AS DIRECTED WITH LANTUS SOLOSTAR PEN   enalapril (VASOTEC) 20 MG tablet TAKE 1 TABLET TWICE DAILY   fenofibrate micronized (LOFIBRA) 134  MG capsule TAKE 1 CAPSULE EVERY DAY   ferrous sulfate 325 (65 FE) MG tablet Twice a week (Patient taking differently: Take 325 mg by mouth 2 (two) times a week.)   fluticasone-salmeterol (ADVAIR) 250-50 MCG/ACT AEPB INHALE 1 PUFF TWICE DAILY   folic acid (FOLVITE) 683 MCG tablet Take 400 mcg by mouth daily with breakfast.   furosemide (LASIX) 20 MG tablet TAKE 1 TABLET EVERY DAY   hydrALAZINE (APRESOLINE) 50 MG tablet Take 1 tablet (50 mg total) by mouth 3 (three) times daily.   lamoTRIgine (LAMICTAL) 25 MG tablet Take 1 tablet twice a day   Lancets Misc. (ACCU-CHEK FASTCLIX LANCET) KIT 1 Device by Does not apply route. Use as directed for testing blood sugar   Lancets Misc. (ACCU-CHEK FASTCLIX LANCET) KIT Use as directed to test blood sugar   LANTUS SOLOSTAR 100 UNIT/ML Solostar Pen INJECT  17  TO  20 UNITS SUBCUTANEOUSLY EVERY MORNING   loratadine (CLARITIN) 10 MG tablet Take 10 mg by mouth every other day.   Melatonin 5 MG CAPS Take 5 mg by mouth at bedtime.   metFORMIN (GLUCOPHAGE-XR) 500 MG 24 hr tablet TAKE ONE TABLET BY MOUTH IN THE MORNING AND AT BEDTIME   nitroGLYCERIN (NITROSTAT) 0.4 MG SL tablet DISSOLVE 1 TAB UNDER TONGUE FOR CHEST PAIN - IF PAIN REMAINS AFTER 5 MIN, CALL 911 AND REPEAT DOSE. MAX 3 TABS IN 15 MINUTES   pantoprazole (PROTONIX) 40 MG  tablet TAKE 1 TABLET EVERY DAY   rosuvastatin (CRESTOR) 40 MG tablet TAKE 1 TABLET EVERY DAY   sertraline (ZOLOFT) 50 MG tablet TAKE 2 TABLETS AT BEDTIME   SPIRIVA HANDIHALER 18 MCG inhalation capsule PLACE 1 CAPSULE INTO HANDIHALER AND INHALE DAILY   spironolactone (ALDACTONE) 25 MG tablet TAKE 1 TABLET (25 MG TOTAL) BY MOUTH DAILY.   TRUE METRIX BLOOD GLUCOSE TEST test strip TEST BLOOD SUGAR  UP  TO FOUR TIMES DAILY AS DIRECTED   TRUEplus Lancets 33G MISC TEST BLOOD SUGAR UP TO FOUR TIMES DAILY AS DIRECTED   VASCEPA 1 g capsule TAKE 2 CAPSULES TWICE DAILY   vitamin B-12 (CYANOCOBALAMIN) 1000 MCG tablet Take 1,000 mcg by mouth daily with  breakfast.   vitamin E 400 UNIT capsule Take 400 Units by mouth at bedtime.   No facility-administered encounter medications on file as of 01/25/2022.   Patient Questions:  Has the patient had any of the following symptoms? 1. Shortness of breath with activity or when lying down. Sometimes with activity. 2. Fatigue and weakness. Yes,  she's unable to exercise 3. Swelling in the legs, ankles and feet. No 4. Rapid or irregular heartbeat. No 5. Reduced ability to exercise. Yes 6. Wheezing. Some 7. A cough that doesn't go away or a cough that brings up white or pink mucus with spots of blood. She has cough that gets worse at night, no mucus that is pink or has spots of blood. 8. Swelling of the belly area. No 9. Very rapid weight gain from fluid buildup. No, only up 1.5 lb from last time she weighed. She's not able to stand on the scale very well. Last 3 readings: 190, 191.2, 192.2 10. Nausea and lack of appetite. No 11. Difficulty concentrating or decreased alertness. No 12. Chest pain if heart failure is caused by a heart attack? No  Care Gaps: Medicare Annual Wellness: Completed 06/07/2021 Ophthalmology Exam: Next due on 10/15/2022 Hemoglobin A1C: 6.8% on 10/14/2021 Dexa Scan: Postponed Mammogram: Aged out  Future Appointments  Date Time Provider [place]  02/21/2022  3:00 PM Rondel Jumbo, PA-C [facility] None  02/22/2022  2:00 PM Skeet Latch, MD [facility]  03/22/2022  2:20 PM [name], MD [facility]  04/25/2022  3:45 PM [facility] CCM PHARMACIST [facility]  06/20/2022  2:30 PM [facility] HEALTH COACH [facility]  08/08/2022  1:00 PM Bernarda Caffey, MD [facility] None   Star Rating Drugs: Enalapril 20 mg last filled 11/10/2021 90 DS Lantus Solostar last filled 12/01/2021 90 DS Metformin 500 mg last filled 12/15/2021 90 DS Rosuvastatin 40 mg last filled 12/23/2021 90 DS  April D Calhoun, [place] Pharmacist Assistant [phone]

## 2022-02-02 ENCOUNTER — Other Ambulatory Visit: Payer: Self-pay | Admitting: Family Medicine

## 2022-02-04 ENCOUNTER — Telehealth: Payer: Self-pay | Admitting: Cardiovascular Disease

## 2022-02-04 ENCOUNTER — Telehealth: Payer: Self-pay | Admitting: Family Medicine

## 2022-02-04 NOTE — Telephone Encounter (Signed)
Spoke with patient's son, [name] and Shanon Brow, who both stated patient has not been experiencing any chest pain or increased shortness of breath. Both are not able to complete a VV on 07/21. States they think it would be better to keep original VV for 07/24 @ 10am.

## 2022-02-04 NOTE — Telephone Encounter (Signed)
Please advise for med changes

## 2022-02-04 NOTE — Telephone Encounter (Signed)
FYI

## 2022-02-04 NOTE — Telephone Encounter (Addendum)
Melissa, RN from [facility], states patient has been experiencing elevated BP readings. Melissa states that she has scheduled patient with her cardiologist for further eval as well. Melissa requests patient be seen via telehealth visit due to patient's difficulty making it out of her home physically. I was able to schedule patient for 07/24 @ 10:00 for a VV. Please advise on further steps.

## 2022-02-04 NOTE — Telephone Encounter (Signed)
   Pt c/o BP issue: STAT if pt c/o blurred vision, one-sided weakness or slurred speech  1. What are your last 5 BP readings?  100/49 196/92 148/79  -this morning  2. Are you having any other symptoms (ex. Dizziness, headache, blurred vision, passed out)? They state that pt has dementia and cant really distinguish her symptoms, she just says she doesn't feel good.   3. What is your BP issue? Melissa from [facility] called stating that pt's son informed her pt bp has been fluctuating from low in the morning and goes up towards the end of the day.

## 2022-02-04 NOTE — Telephone Encounter (Signed)
I'm also willing to do a virtual this afternoon if her son is around to help at this time- if not we will plan on 24th- please make sure no chest pain or increased shortness of breath from baseline

## 2022-02-07 ENCOUNTER — Telehealth (INDEPENDENT_AMBULATORY_CARE_PROVIDER_SITE_OTHER): Payer: Medicare HMO | Admitting: Family Medicine

## 2022-02-07 ENCOUNTER — Encounter: Payer: Self-pay | Admitting: Family Medicine

## 2022-02-07 VITALS — BP 93/50 | Ht 65.0 in | Wt 188.8 lb

## 2022-02-07 DIAGNOSIS — I10 Essential (primary) hypertension: Secondary | ICD-10-CM

## 2022-02-07 DIAGNOSIS — R531 Weakness: Secondary | ICD-10-CM

## 2022-02-07 DIAGNOSIS — I5032 Chronic diastolic (congestive) heart failure: Secondary | ICD-10-CM | POA: Diagnosis not present

## 2022-02-07 MED ORDER — AMLODIPINE BESYLATE 5 MG PO TABS: ORAL_TABLET | ORAL | 3 refills | Status: DC

## 2022-02-07 NOTE — Progress Notes (Signed)
Phone [phone] Virtual visit via Video note   Subjective:  Chief complaint: Chief Complaint  Patient presents with   Follow-up   Hypertension    Pt son states bp contiunes to float the lowest 99/48 and the highest 186/102 and they are checking several times a day. It seems to increase during the day and lowers at bed time. Declines swelling in legs and is out of breath walking from bathroom to living. Pt states when PT "quit" the patient "quits" to but did admit that PT was helping but now that PT is not coming she uses that as an excuse to not do anything.    This visit type was conducted due to national recommendations for restrictions regarding the COVID-19 Pandemic (e.g. social distancing).  This format is felt to be most appropriate for this patient at this time balancing risks to patient and risks to population by having him in for in person visit.  No physical exam was performed (except for noted visual exam or audio findings with Telehealth visits).    Our team/I connected with [name] at 10:00 AM EDT by a video enabled telemedicine application ([email or web address] or caregility through epic) and verified that I am speaking with the correct person using two identifiers.  Location patient: [facility] Location provider: [facility], office Persons participating in the virtual visit:  patient  Our team/I discussed the limitations of evaluation and management by telemedicine and the availability of in person appointments. In light of current covid-19 pandemic, patient also understands that we are trying to protect them by minimizing in office contact if at all possible.  The patient expressed consent for telemedicine visit and agreed to proceed. Patient understands insurance will be billed.   Past Medical History-  Patient Active Problem List   Diagnosis Date Noted   Epilepsy ([place]) 03/12/2021    Priority: High   Dementia ([place]) 10/22/2020    Priority: High   Delirium 06/25/2020     Priority: High   Chronic diastolic CHF (congestive heart failure) ([place]) 03/13/2014    Priority: High   CAD- RCA PCI '90s, STEMI-RCA DES 02/18/14 03/05/2014    Priority: High   Type 2 diabetes mellitus without complication, with long-term current use of insulin ([place]) 03/26/2007    Priority: High   Anemia, iron deficiency 10/23/2015    Priority: Medium    CKD (chronic kidney disease), stage III ([place]) 07/25/2014    Priority: Medium    Osteoarthritis, knee 05/13/2014    Priority: Medium    Anxiety state 04/21/2014    Priority: Medium    SOB (shortness of breath) 03/05/2014    Priority: Medium    Hyperlipidemia associated with type 2 diabetes mellitus ([place]) 03/26/2007    Priority: Medium    Essential hypertension 04/21/2006    Priority: Medium    COPD (chronic obstructive pulmonary disease) ([place]) 04/21/2006    Priority: Medium    Actinic keratosis 11/17/2017    Priority: Low   Back pain, lumbosacral 10/10/2012    Priority: Low   Adnexal mass 11/24/2020   Thickened endometrium 11/24/2020    Medications- reviewed and updated Current Outpatient Medications  Medication Sig Dispense Refill   amLODipine (NORVASC) 5 MG tablet Take 5 mg in the morning. If afternoon blood pressure >145/90 take second tablet 180 tablet 3   acetaminophen (TYLENOL) 650 MG CR tablet Take 1,300 mg by mouth 3 (three) times daily. Patient take 3 times a day.     albuterol (PROVENTIL) (2.5 MG/3ML) 0.083% nebulizer  solution USE THREE MILLILITERS VIA NEBULIZATION BY MOUTH EVERY 6 HOURS AS NEEDED FOR WHEEZING OR SHORTNESS OF BREATH 75 mL 3   albuterol (VENTOLIN HFA) 108 (90 Base) MCG/ACT inhaler INHALE 1 PUFF EVERY 4 HOURS AS NEEDED FOR WHEEZING, SHORTNESS OF BREATH (RESCUE INHALER IF ADVAIR NOT WORKING) 2 each 2   Alcohol Swabs (DROPSAFE ALCOHOL PREP) 70 % PADS USE  FOR  TESTING THREE TIMES DAILY 300 each 1   aspirin 81 MG tablet Take 81 mg by mouth daily with breakfast.     Blood Glucose Calibration (ACCU-CHEK  SMARTVIEW CONTROL) LIQD      blood glucose meter kit and supplies KIT Dispense based on patient and insurance preference. Use up to four times daily as directed. Dx:E11.9 1 each 3   calcium carbonate (OSCAL) 1500 (600 Ca) MG TABS tablet Take 600 mg of elemental calcium by mouth daily with breakfast.     carvedilol (COREG) 12.5 MG tablet Take 25 mg by mouth 2 (two) times daily with a meal.     cholecalciferol (VITAMIN D3) 25 MCG (1000 UNIT) tablet Take 1,000 Units by mouth daily with breakfast.     clopidogrel (PLAVIX) 75 MG tablet TAKE 1 TABLET EVERY DAY 90 tablet 1   diclofenac Sodium (VOLTAREN) 1 % GEL APPLY 2 GRAMS TOPICALLY TO THE AFFECTED AREA FOUR TIMES A DAY 300 g 3   DROPLET PEN NEEDLES 31G X 8 MM MISC USE AS DIRECTED WITH LANTUS SOLOSTAR PEN 100 each 4   enalapril (VASOTEC) 20 MG tablet TAKE 1 TABLET TWICE DAILY 180 tablet 3   fenofibrate micronized (LOFIBRA) 134 MG capsule TAKE 1 CAPSULE EVERY DAY 90 capsule 3   ferrous sulfate 325 (65 FE) MG tablet Twice a week (Patient taking differently: Take 325 mg by mouth 2 (two) times a week.) 18 tablet 3   fluticasone-salmeterol (ADVAIR) 250-50 MCG/ACT AEPB INHALE 1 PUFF TWICE DAILY 947 each 3   folic acid (FOLVITE) 096 MCG tablet Take 400 mcg by mouth daily with breakfast.     furosemide (LASIX) 20 MG tablet TAKE 1 TABLET EVERY DAY 90 tablet 2   hydrALAZINE (APRESOLINE) 50 MG tablet Take 1 tablet (50 mg total) by mouth 3 (three) times daily. 270 tablet 1   lamoTRIgine (LAMICTAL) 25 MG tablet Take 1 tablet twice a day 180 tablet 3   Lancets Misc. (ACCU-CHEK FASTCLIX LANCET) KIT 1 Device by Does not apply route. Use as directed for testing blood sugar     Lancets Misc. (ACCU-CHEK FASTCLIX LANCET) KIT Use as directed to test blood sugar 1 kit 1   LANTUS SOLOSTAR 100 UNIT/ML Solostar Pen INJECT  17  TO  20 UNITS SUBCUTANEOUSLY EVERY MORNING 30 mL 5   loratadine (CLARITIN) 10 MG tablet Take 10 mg by mouth every other day.     Melatonin 5 MG CAPS  Take 5 mg by mouth at bedtime.     metFORMIN (GLUCOPHAGE-XR) 500 MG 24 hr tablet TAKE ONE TABLET BY MOUTH IN THE MORNING AND AT BEDTIME 180 tablet 3   nitroGLYCERIN (NITROSTAT) 0.4 MG SL tablet DISSOLVE 1 TAB UNDER TONGUE FOR CHEST PAIN - IF PAIN REMAINS AFTER 5 MIN, CALL 911 AND REPEAT DOSE. MAX 3 TABS IN 15 MINUTES 25 tablet 6   pantoprazole (PROTONIX) 40 MG tablet TAKE 1 TABLET EVERY DAY 90 tablet 1   rosuvastatin (CRESTOR) 40 MG tablet TAKE 1 TABLET EVERY DAY 90 tablet 0   sertraline (ZOLOFT) 50 MG tablet TAKE 2 TABLETS AT BEDTIME 180  tablet 2   SPIRIVA HANDIHALER 18 MCG inhalation capsule PLACE 1 CAPSULE INTO HANDIHALER AND INHALE THE CONTENTS DAILY 90 capsule 2   spironolactone (ALDACTONE) 25 MG tablet TAKE 1 TABLET (25 MG TOTAL) BY MOUTH DAILY. 90 tablet 3   TRUE METRIX BLOOD GLUCOSE TEST test strip TEST BLOOD SUGAR  UP  TO FOUR TIMES DAILY AS DIRECTED 400 strip 3   TRUEplus Lancets 33G MISC TEST BLOOD SUGAR UP TO FOUR TIMES DAILY AS DIRECTED 400 each 3   VASCEPA 1 g capsule TAKE 2 CAPSULES TWICE DAILY 360 capsule 0   vitamin B-12 (CYANOCOBALAMIN) 1000 MCG tablet Take 1,000 mcg by mouth daily with breakfast.     vitamin E 400 UNIT capsule Take 400 Units by mouth at bedtime.     No current facility-administered medications for this visit.     Objective:  BP (!) 93/50   Ht '5\' 5"'  (1.651 m)   Wt 188 lb 12.8 oz (85.6 kg)   BMI 31.42 kg/m  self reported vitals Gen: NAD, resting comfortably in her chair Lungs: nonlabored, normal respiratory rate  Skin: appears dry, no obvious rash      Assessment and Plan   #hypertension #CHF  #generalized weakness S: medication:  amlodipine 10 mg (morning), carvedilol 25 mg BID, enalapril 20 mg BID, lasix 20 mg, hydralazine 50 mg TID, spironolactone 25 mg (morning) Home readings #s: runs lower in the morning usually 120s or 130s in AM (sporadic lows) and then goes up around 150-160 but can be higher (sporadic)  -no increased swelling the legs -  more shortness of breath - even with minimal activity like going to bathroom - but overall pretty stable -"graduated from PT"- basically stopped her activity when PT stopped a few months ago- weakness has progressed again  - home weights table but harder to get on scales- was able to weigh in last 24 hours- few lbs down from 191.2 BP Readings from Last 3 Encounters:  02/07/22 (!) 93/50 hard to get home cuff to fit well. Initial BP > 150 was before meds  10/18/21 (!) 86/44  10/14/21 130/64  A/P: CHF appears largely stable-no increased edema or weight but is having some shortness of breath and generalized weakness again-has CAD as well but since this previously improved with physical therapy I think retrialing physical therapy would be helpful.  She was unable to come into the office today due to strength issues and we are hoping she will be able to make it by scheduled follow-up in September with help of physical therapy - We discussed that she is not may need to consider hospice in the long run (currently recommend palliative care).  I wonder if the abdominal mass/potential cancer that we have not been able to work-up further could be contributing  For hypertension portion running pretty low in the morning (overcontrolled in a.m.) but higher in the afternoon (poorly controlled in the afternoon)-we opted to split her amlodipine down to 5 mgAnd only take 5 in the morning and then can take an additional 5 in the afternoon if needed - May also reduce hydralazine to 25 mg if this is not effective-particularly at least the morning dose-they will update me in 2 weeks  Recommended follow up: Return for next already scheduled visit or sooner if needed. Future Appointments  Date Time Provider [place]  02/21/2022  3:00 PM Elease Hashimoto [facility] None  02/22/2022  2:00 PM Skeet Latch, MD [facility]  03/22/2022  2:20 PM  [name], MD [facility]  04/25/2022  3:45 PM [facility] CCM  PHARMACIST [facility]  06/20/2022  2:30 PM [facility] HEALTH COACH [facility]  08/08/2022  1:00 PM Bernarda Caffey, MD [facility] None    Lab/Order associations:   ICD-10-CM   1. Essential hypertension  I10     2. Chronic diastolic CHF (congestive heart failure) (HCC)  I50.32 Ambulatory referral to Home Health    3. Generalized weakness  R53.1 Ambulatory referral to [place] ordered this encounter  Medications   amLODipine (NORVASC) 5 MG tablet    Sig: Take 5 mg in the morning. If afternoon blood pressure >145/90 take second tablet    Dispense:  180 tablet    Refill:  3    Return precautions advised.  [name], MD

## 2022-02-07 NOTE — Patient Instructions (Signed)
Health Maintenance Due  Topic Date Due   Zoster Vaccines- Shingrix (1 of 2) Never done    Recommended follow up: No follow-ups on file.

## 2022-02-16 ENCOUNTER — Other Ambulatory Visit: Payer: Self-pay | Admitting: Cardiovascular Disease

## 2022-02-21 ENCOUNTER — Telehealth: Payer: Self-pay | Admitting: Cardiovascular Disease

## 2022-02-21 ENCOUNTER — Telehealth (INDEPENDENT_AMBULATORY_CARE_PROVIDER_SITE_OTHER): Payer: Medicare HMO | Admitting: Physician Assistant

## 2022-02-21 DIAGNOSIS — F0150 Vascular dementia without behavioral disturbance: Secondary | ICD-10-CM

## 2022-02-21 DIAGNOSIS — G40009 Localization-related (focal) (partial) idiopathic epilepsy and epileptic syndromes with seizures of localized onset, not intractable, without status epilepticus: Secondary | ICD-10-CM

## 2022-02-21 NOTE — Telephone Encounter (Signed)
Patient being seen 8/8 telephone visit with Dr. Oval Linsey

## 2022-02-21 NOTE — Telephone Encounter (Signed)
Pt son called requesting that pt appt tomorrow be changed from [place] video to a televisit    Tele visit phone number [name], son): [phone]

## 2022-02-21 NOTE — Progress Notes (Signed)
Virtual Visit via Video Note The purpose of this virtual visit is to provide medical care in a patient that is unable to be seen in person due to physical or health limitations   Consent was obtained for video visit:  yes  Answered questions that patient had about telehealth interaction:  yes I discussed the limitations, risks, security and privacy concerns of performing an evaluation and management service by telemedicine. I also discussed with the patient that there may be a patient responsible charge related to this service. The patient expressed understanding and agreed to proceed.  Pt location: Home Physician Location: office Name of referring provider:  [name], MD I connected with Michelle [name] at patients initiation/request on 02/21/2022 at  3:00 PM EDT by video enabled telemedicine application and verified that I am speaking with the correct person using two identifiers. Pt MRN:  [id number] Pt DOB:  Jun 09, 1933 Video Participants:  Michelle [name];  ***    Assessment and Plan:   '@PATIENTNAME' @ is a 88 y.o. RH female with a history of   seen today in follow up. This is an 88 year old right-handed woman with a history of hypertension, hyperlipidemia, diabetes, CAD, 1st degree heart block, vascular dementia, with left temporal lobe epilepsy. No further episodes of loss of consciousness since January 2022, no episodes of unresponsiveness since June 2022. She is on low dose Lamotrigine 12m BID, continue current dose. There have been a few times of confusion that appear more likely related to dementia. MMSE 20/30 in July 2022. Continue 24/7 care. She does not drive. Follow-up in 6 months, call for any changes.       History of Present Illness: ***   History of Present Illness:  The patient had a virtual video visit on 08/19/2021. She was last seen in the neurology clinic 7 months ago for left temporal lobe epilepsy and dementia. Her son RLouie Casais present to provide additional  information. On her last visit, he reported 2 episodes of confusion where she was looking around, unable to answer questions or follow instructions. No episodes of loss of consciousness with sonorous respirations since January 2022. She had side effects on Depakote. Lamotrigine dose was increased to 249mBID in July 2022, no side effects. She has had a couple of times where she was confused for a minute, thinking she was talking to her mother, however no staring/unresponsiveness noted. No focal numbness/tingling/weakness, no myoclonic jerks. She denies any headaches, dizziness, vision changes, no falls. Both legs hurt due to arthritis, she ambulates with a walker. Sleep is good most of the times. Mood is okay. Memory is "terrible." Her son manages medications, finances, meals. She does not drive. She is independent with dressing and bathing. She has 24/7 supervision.     History on Initial Assessment 09/22/2020: This is an [age]ear old right-handed woman with a history of hypertension, hyperlipidemia, diabetes, CAD, 1st degree heart block, presenting for evaluation of confusion. Her son RaLouie Casas present to provide additional information. She states "I don't remember everything too well." RaLouie Casaeports that for the most part she is doing okay. She has more confusion when she is admitted in the hospital, in November she did not even recognize RaLouie Casand kept thinking he was his brother for a few days. He notices memory issues on occasion. She has been living with RaLouie Casaor over 60 years. Her other son was managing her medications because she is on so many, then during the pandemic, RaLouie Casa  took over 2 years ago. He manages meals. Her other son has been managing her finances for the past 3 years, "he just wanted to help." She stopped driving in 1914 because it got to where she did not know what she was doing, she could not remember what was supposed to be beside of her. [name] reports it was due to her vision. She is  independent with dressing and bathing.He states she does not repeat herself for the most part. In December she had more confusion, not knowing what was going on. She was brought to the ER and had an MRI brain without contrast which did not show any acute changes, there was mild to moderate diffuse atrophy and chronic microvascular disease. She was admitted to [facility] in January 2022 when he heard her snoring very loudly, he came to find her with eyes wide open, jaw clenched doing the snoring sound. She was unresponsive with body stiff. EMS got some response and she was confused in the ER wanting to go home. BP was 186/81. She had an EEG which showed intermittent generalized slowing, head CT no acute changes. TSH, B12, RPR normal. She was diagnosed with hypertensive encephalopathy and had delirium in the hospital that improved with Seroquel. [name] reports she has improved since then and has been back to normal. No further similar episodes.    She denies any gaps in time, olfactory/gustatory hallucinations, focal numbness/tingling/weakness, myoclonic jerks. She has diarrhea frequently with some incontinence and plans to start wearing Depends. No bladder incontinence. She cannot smell anything. She has occasional hand tremors that do not affect activities. She has pain in her knees. She denies any headaches, dizziness, diplopia, dysarthria/dysphagia, neck/back pain. She has sleep difficulties, sometimes staying awake all night long. She does not take too many naps in the day. No family history of dementia, significant head injuries, alcohol use. No family history of seizures.    Diagnostic Data: MRI brain without contrast 06/2020 no acute changes, diffuse volume loss and mild chronic microvascular disease   Routine EEG 07/2020: intermittent slow, generalized   24-hour EEG in 09/2020 abnormal due to focal slowing over the left>right temporal regions, occasional left frontotemporal epileptiform discharges      Current Outpatient Medications on File Prior to Visit  Medication Sig Dispense Refill   acetaminophen (TYLENOL) 650 MG CR tablet Take 1,300 mg by mouth 3 (three) times daily. Patient take 3 times a day.     albuterol (PROVENTIL) (2.5 MG/3ML) 0.083% nebulizer solution USE THREE MILLILITERS VIA NEBULIZATION BY MOUTH EVERY 6 HOURS AS NEEDED FOR WHEEZING OR SHORTNESS OF BREATH 75 mL 3   albuterol (VENTOLIN HFA) 108 (90 Base) MCG/ACT inhaler INHALE 1 PUFF EVERY 4 HOURS AS NEEDED FOR WHEEZING, SHORTNESS OF BREATH (RESCUE INHALER IF ADVAIR NOT WORKING) 2 each 2   Alcohol Swabs (DROPSAFE ALCOHOL PREP) 70 % PADS USE  FOR  TESTING THREE TIMES DAILY 300 each 1   amLODipine (NORVASC) 5 MG tablet Take 5 mg in the morning. If afternoon blood pressure >145/90 take second tablet 180 tablet 3   aspirin 81 MG tablet Take 81 mg by mouth daily with breakfast.     Blood Glucose Calibration (ACCU-CHEK SMARTVIEW CONTROL) LIQD      blood glucose meter kit and supplies KIT Dispense based on patient and insurance preference. Use up to four times daily as directed. Dx:E11.9 1 each 3   calcium carbonate (OSCAL) 1500 (600 Ca) MG TABS tablet Take 600 mg of elemental calcium by mouth  daily with breakfast.     carvedilol (COREG) 12.5 MG tablet TAKE 1 TABLET TWICE DAILY 180 tablet 3   cholecalciferol (VITAMIN D3) 25 MCG (1000 UNIT) tablet Take 1,000 Units by mouth daily with breakfast.     clopidogrel (PLAVIX) 75 MG tablet TAKE 1 TABLET EVERY DAY 90 tablet 1   diclofenac Sodium (VOLTAREN) 1 % GEL APPLY 2 GRAMS TOPICALLY TO THE AFFECTED AREA FOUR TIMES A DAY 300 g 3   DROPLET PEN NEEDLES 31G X 8 MM MISC USE AS DIRECTED WITH LANTUS SOLOSTAR PEN 100 each 4   enalapril (VASOTEC) 20 MG tablet TAKE 1 TABLET TWICE DAILY 180 tablet 3   fenofibrate micronized (LOFIBRA) 134 MG capsule TAKE 1 CAPSULE EVERY DAY 90 capsule 3   ferrous sulfate 325 (65 FE) MG tablet Twice a week (Patient taking differently: Take 325 mg by mouth 2 (two) times a  week.) 18 tablet 3   fluticasone-salmeterol (ADVAIR) 250-50 MCG/ACT AEPB INHALE 1 PUFF TWICE DAILY 811 each 3   folic acid (FOLVITE) 031 MCG tablet Take 400 mcg by mouth daily with breakfast.     furosemide (LASIX) 20 MG tablet TAKE 1 TABLET EVERY DAY 90 tablet 2   hydrALAZINE (APRESOLINE) 50 MG tablet Take 1 tablet (50 mg total) by mouth 3 (three) times daily. 270 tablet 1   lamoTRIgine (LAMICTAL) 25 MG tablet Take 1 tablet twice a day 180 tablet 3   Lancets Misc. (ACCU-CHEK FASTCLIX LANCET) KIT 1 Device by Does not apply route. Use as directed for testing blood sugar     Lancets Misc. (ACCU-CHEK FASTCLIX LANCET) KIT Use as directed to test blood sugar 1 kit 1   LANTUS SOLOSTAR 100 UNIT/ML Solostar Pen INJECT  17  TO  20 UNITS SUBCUTANEOUSLY EVERY MORNING 30 mL 5   loratadine (CLARITIN) 10 MG tablet Take 10 mg by mouth every other day.     Melatonin 5 MG CAPS Take 5 mg by mouth at bedtime.     metFORMIN (GLUCOPHAGE-XR) 500 MG 24 hr tablet TAKE ONE TABLET BY MOUTH IN THE MORNING AND AT BEDTIME 180 tablet 3   nitroGLYCERIN (NITROSTAT) 0.4 MG SL tablet DISSOLVE 1 TAB UNDER TONGUE FOR CHEST PAIN - IF PAIN REMAINS AFTER 5 MIN, CALL 911 AND REPEAT DOSE. MAX 3 TABS IN 15 MINUTES 25 tablet 6   pantoprazole (PROTONIX) 40 MG tablet TAKE 1 TABLET EVERY DAY 90 tablet 1   rosuvastatin (CRESTOR) 40 MG tablet TAKE 1 TABLET EVERY DAY 90 tablet 0   sertraline (ZOLOFT) 50 MG tablet TAKE 2 TABLETS AT BEDTIME 180 tablet 2   SPIRIVA HANDIHALER 18 MCG inhalation capsule PLACE 1 CAPSULE INTO HANDIHALER AND INHALE THE CONTENTS DAILY 90 capsule 2   spironolactone (ALDACTONE) 25 MG tablet TAKE 1 TABLET (25 MG TOTAL) BY MOUTH DAILY. 90 tablet 3   TRUE METRIX BLOOD GLUCOSE TEST test strip TEST BLOOD SUGAR  UP  TO FOUR TIMES DAILY AS DIRECTED 400 strip 3   TRUEplus Lancets 33G MISC TEST BLOOD SUGAR UP TO FOUR TIMES DAILY AS DIRECTED 400 each 3   VASCEPA 1 g capsule TAKE 2 CAPSULES TWICE DAILY 360 capsule 0   vitamin B-12  (CYANOCOBALAMIN) 1000 MCG tablet Take 1,000 mcg by mouth daily with breakfast.     vitamin E 400 UNIT capsule Take 400 Units by mouth at bedtime.     No current facility-administered medications on file prior to visit.     Observations/Objective:   There were no vitals filed  for this visit. GEN:  The patient appears stated age and is in NAD.  Neurological examination: Patient is awake, alert, oriented x 3. No aphasia or dysarthria. Intact fluency and comprehension. Remote and recent memory intact. Able to name and repeat. Cranial nerves: Extraocular movements intact with no nystagmus. No facial asymmetry. Motor: moves all extremities symmetrically, at least anti-gravity x 4. No incoordination on finger to nose testing. Gait: narrow-based and steady, able to tandem walk adequately. Negative Romberg test.     Follow Up Instructions:    -I discussed the assessment and treatment plan with the patient. The patient was provided an opportunity to ask questions and all were answered. The patient agreed with the plan and demonstrated an understanding of the instructions.   The patient was advised to call back or seek an in-person evaluation if the symptoms worsen or if the condition fails to improve as anticipated.    Total time spent on today's visit was ***minutes, including both face-to-face time and nonface-to-face time.  Time included that spent on review of records (prior notes available to me/labs/imaging if pertinent), discussing treatment and goals, answering patient's questions and coordinating care.   Sharene Butters, PA-C

## 2022-02-22 ENCOUNTER — Encounter (HOSPITAL_BASED_OUTPATIENT_CLINIC_OR_DEPARTMENT_OTHER): Payer: Self-pay | Admitting: Cardiovascular Disease

## 2022-02-22 ENCOUNTER — Ambulatory Visit (INDEPENDENT_AMBULATORY_CARE_PROVIDER_SITE_OTHER): Payer: Medicare HMO | Admitting: Cardiovascular Disease

## 2022-02-22 DIAGNOSIS — E7800 Pure hypercholesterolemia, unspecified: Secondary | ICD-10-CM | POA: Diagnosis not present

## 2022-02-22 DIAGNOSIS — I10 Essential (primary) hypertension: Secondary | ICD-10-CM

## 2022-02-22 DIAGNOSIS — I2510 Atherosclerotic heart disease of native coronary artery without angina pectoris: Secondary | ICD-10-CM | POA: Diagnosis not present

## 2022-02-22 NOTE — Assessment & Plan Note (Addendum)
Overall her blood pressures have been more stable but not ideal.  We noted that she is not taking her 12-hour medicines exactly 12 hours apart and I think she is leaving long gaps between the evening and her next dose in the morning.  She will start taking her twice daily medicines that 8 AM and 8 PM.  She will take her hydralazine then as well a 2 PM.  She only takes a second dose of amlodipine if her blood pressures are elevated in the evening.  She will change the timing of her medications and track them.  For now continue amlodipine, carvedilol, enalapril, spironolactone, and hydralazine.  She has had significant hypotension in the past so we will not be more aggressive with her regimen at this time.

## 2022-02-22 NOTE — Assessment & Plan Note (Signed)
LDL goal is less than 70.  Lipids are well-controlled on her current regimen.

## 2022-02-22 NOTE — Patient Instructions (Addendum)
Medication Instructions:  TAKE THE FOLLOWING MEDICATIONS AS LISTED BELOW   EVERY MORNING AT 8:00 AM  CARVEDILOL  AMLODIPINE HYDRALAZINE ENALAPRIL  FUROSEMIDE  SPIRONOLACTONE   2:00 PM  HYDRALAZINE   EVERY EVENING AT 8:00 PM  CARVEDILOL  HYDRALAZINE ENALAPRIL  AMLODIPINE IF BLOOD PRESSURE ABOVE 145/90   *If you need a refill on your cardiac medications before your next appointment, please call your pharmacy*  Lab Work: NONE   Testing/Procedures: NONE   Follow-Up:  03/30/2022 Virtual Visit at 3:10 with Overton Mam NP

## 2022-02-22 NOTE — Progress Notes (Signed)
Virtual Visit via Telephone Note   This visit type was conducted due to national recommendations for restrictions regarding the COVID-19 Pandemic (e.g. social distancing) in an effort to limit this patient's exposure and mitigate transmission in our community.  Due to her co-morbid illnesses, this patient is at least at moderate risk for complications without adequate follow up.  This format is felt to be most appropriate for this patient at this time.  All issues noted in this document were discussed and addressed.  A limited physical exam was performed with this format.  Please refer to the patient's chart for her consent to telehealth for [facility].  The patient was identified using 2 identifiers.  Patient Location: Home Physician Location: Office  Date:  02/22/2022   ID:  Michelle [name], DOB 12/22/1932, MRN [id number]  PCP:  [name], MD  Cardiologist:  Lauree Chandler, MD   Referring MD: [name], MD   CC: Hypertension  History of Present Illness:    Michelle [name] is a 88 y.o. female with a hx of CAD status post angioplasty of the RCA in 1990 and inferior STEMI 02/2014, diabetes, hypertension, hyperlipidemia, prior tobacco abuse, and chronic diastolic heart failure here for follow-up. She initially established care in the hypertension clinic 09/03/2020. She was admitted for an inferior STEMI 02/2014 and had a completely occluded RCA with an aneurysm that formed prior to the angioplasty site the aneurysmal segment was treated with 2 covered stents.  She did have a small pericardial effusion post MI.  Follow-up echo revealed resolution of her pericardial effusion and normal systolic function.  She was last seen by [facility] cardiology 05/2019.  At that appointment her blood pressure was 138/62 and she was doing well.  At some point metoprolol was started instead of carvedilol 12.80m.  Ms. PHunkinswas admitted 08/2020 with confusion.  Her son found her in the bed  unresponsive with her jaws clenched.  Her BP at presentation was 186/81.  She is thought to have hypertensive encephalopathy and delirium.  Head CT was unremarkable.  Her symptoms improved with better BP control.  Her home amlodipine, enalapril, and spironolactone were continued.  Metoprolol was increased and hydralazine was added.  Her dose of furosemide was reduced due to concern for dehydration. Her blood pressure was controlled at her visit on 08/2020.  Cortisol was within normal limits.  Renal artery Dopplers were - 07/2020.  She reported home blood pressures averaging within 130s to 140s but ranging from 110s to 160s. She noticed her blood pressure was highest in the evenings. Hydralazine was increased to 50 mg and carvedilol was increased to 25 mg.  At the last visit she was feeling very fatigued.  Her blood pressure was 59/27 and improved to 86/44 on repeat..  In general her blood pressures had been stable ranging from the 120s to 140s over 60s to 80s.  She also reported respiratory symptoms with cough and phlegm.  We recommended holding her antihypertensives and that she follow-up with her PCP.  Hydralazine was reduced to 25 mg.  She saw her PCP on 01/2022 and her blood pressure was 93/50.  Her home readings were 120s to 130s in the morning and then 150s to 160s later in the day.  Her PCP recommended splitting her amlodipine and taking 5 in the morning with an additional 5 mg in the afternoon only if needed.  Today, she is accompanied on the call by her son, who provides most of the  history. She says she is doing well. Her blood pressure has been fluctuating. In the mornings it is usually elevated. Her son reports that yesterday her blood pressure was 118/62 when taken by her physical therapist. By bedtime, her blood pressure had increased to 155/77. One of her lowest readings recently was on 02/19/22, when it was 113/59. She normally takes her morning medications between 9 AM - 10 AM. Her afternoon  medications are around 2 PM, and her evening medications are taken between 8 - 8:30 PM. Additionally, she has been having some breathing issues and has been taking a nebulizer and albuterol, which seems to help. She also complains of knee pain which they are treating with voltaren gel that she uses 4-5 times a day. She denies any palpitations, chest pain, or peripheral edema. No lightheadedness, headaches, syncope, orthopnea, or PND.    Past Medical History:  Diagnosis Date   Anginal pain ([place])    Arthritis    AV block, 1st degree    CAD (coronary artery disease) 1990; 2015   Cardiac cath 1990 with Dr. Lia Foyer and [name] Residual CAD, non obstructive per 2015 cath. STEMI status post stent in August 2015.   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 02/18/14   Promus DES to RCA with STEMI   Shortness of breath     Past Surgical History:  Procedure Laterality Date   CATARACT EXTRACTION  2020   right and left eye    CHOLECYSTECTOMY     thinks her appendix was removed at the same time   [place]  02/18/14   Promus DES to RCA   [place] N/A 02/18/2014   Procedure: [place];  Surgeon: Jettie Booze, MD;  Location: [facility] CATH LAB;  Service: Cardiovascular;  Laterality: N/A;   PTCA  1990   PTCA of RCA   TRANSTHORACIC ECHOCARDIOGRAM  02/02/2012   mild LVH, EF 55-60%, Normal WM, Gr 1 DD; Mild MR    Current Medications: Current  Meds  Medication Sig   acetaminophen (TYLENOL) 650 MG CR tablet Take 1,300 mg by mouth 3 (three) times daily. Patient take 3 times a day.   albuterol (PROVENTIL) (2.5 MG/3ML) 0.083% nebulizer solution USE THREE MILLILITERS VIA NEBULIZATION BY MOUTH EVERY 6 HOURS AS NEEDED FOR WHEEZING OR SHORTNESS OF BREATH   albuterol (VENTOLIN HFA) 108 (90 Base) MCG/ACT inhaler INHALE 1 PUFF EVERY 4 HOURS AS NEEDED FOR WHEEZING, SHORTNESS OF BREATH (RESCUE INHALER IF ADVAIR NOT WORKING)   Alcohol Swabs (DROPSAFE ALCOHOL PREP) 70 % PADS USE  FOR  TESTING THREE TIMES DAILY   amLODipine (NORVASC) 5 MG tablet Take 5 mg in the morning. If afternoon blood pressure >145/90 take second tablet   aspirin 81 MG tablet Take 81 mg by mouth daily with breakfast.   Blood Glucose Calibration (ACCU-CHEK SMARTVIEW CONTROL) LIQD    blood glucose meter kit and supplies KIT Dispense based on patient and insurance preference. Use up to four  times daily as directed. Dx:E11.9   calcium carbonate (OSCAL) 1500 (600 Ca) MG TABS tablet Take 600 mg of elemental calcium by mouth daily with breakfast.   carvedilol (COREG) 12.5 MG tablet TAKE 1 TABLET TWICE DAILY   cholecalciferol (VITAMIN D3) 25 MCG (1000 UNIT) tablet Take 1,000 Units by mouth daily with breakfast.   clopidogrel (PLAVIX) 75 MG tablet TAKE 1 TABLET EVERY DAY   diclofenac Sodium (VOLTAREN) 1 % GEL APPLY 2 GRAMS TOPICALLY TO THE AFFECTED AREA FOUR TIMES A DAY   DROPLET PEN NEEDLES 31G X 8 MM MISC USE AS DIRECTED WITH LANTUS SOLOSTAR PEN   enalapril (VASOTEC) 20 MG tablet TAKE 1 TABLET TWICE DAILY   fenofibrate micronized (LOFIBRA) 134 MG capsule TAKE 1 CAPSULE EVERY DAY   ferrous sulfate 325 (65 FE) MG tablet Twice a week (Patient taking differently: Take 325 mg by mouth 2 (two) times a week.)   fluticasone-salmeterol (ADVAIR) 250-50 MCG/ACT AEPB INHALE 1 PUFF TWICE DAILY   folic acid (FOLVITE) 193 MCG tablet Take 400 mcg by mouth daily with breakfast.   furosemide (LASIX) 20  MG tablet TAKE 1 TABLET EVERY DAY   hydrALAZINE (APRESOLINE) 50 MG tablet Take 1 tablet (50 mg total) by mouth 3 (three) times daily.   lamoTRIgine (LAMICTAL) 25 MG tablet Take 1 tablet twice a day   Lancets Misc. (ACCU-CHEK FASTCLIX LANCET) KIT 1 Device by Does not apply route. Use as directed for testing blood sugar   Lancets Misc. (ACCU-CHEK FASTCLIX LANCET) KIT Use as directed to test blood sugar   LANTUS SOLOSTAR 100 UNIT/ML Solostar Pen INJECT  17  TO  20 UNITS SUBCUTANEOUSLY EVERY MORNING   loratadine (CLARITIN) 10 MG tablet Take 10 mg by mouth every other day.   Melatonin 5 MG CAPS Take 5 mg by mouth at bedtime.   metFORMIN (GLUCOPHAGE-XR) 500 MG 24 hr tablet TAKE ONE TABLET BY MOUTH IN THE MORNING AND AT BEDTIME   nitroGLYCERIN (NITROSTAT) 0.4 MG SL tablet DISSOLVE 1 TAB UNDER TONGUE FOR CHEST PAIN - IF PAIN REMAINS AFTER 5 MIN, CALL 911 AND REPEAT DOSE. MAX 3 TABS IN 15 MINUTES   pantoprazole (PROTONIX) 40 MG tablet TAKE 1 TABLET EVERY DAY   rosuvastatin (CRESTOR) 40 MG tablet TAKE 1 TABLET EVERY DAY   sertraline (ZOLOFT) 50 MG tablet TAKE 2 TABLETS AT BEDTIME   SPIRIVA HANDIHALER 18 MCG inhalation capsule PLACE 1 CAPSULE INTO HANDIHALER AND INHALE THE CONTENTS DAILY   spironolactone (ALDACTONE) 25 MG tablet TAKE 1 TABLET (25 MG TOTAL) BY MOUTH DAILY.   TRUE METRIX BLOOD GLUCOSE TEST test strip TEST BLOOD SUGAR  UP  TO FOUR TIMES DAILY AS DIRECTED   TRUEplus Lancets 33G MISC TEST BLOOD SUGAR UP TO FOUR TIMES DAILY AS DIRECTED   VASCEPA 1 g capsule TAKE 2 CAPSULES TWICE DAILY   vitamin B-12 (CYANOCOBALAMIN) 1000 MCG tablet Take 1,000 mcg by mouth daily with breakfast.   vitamin E 400 UNIT capsule Take 400 Units by mouth at bedtime.     Allergies:   Codeine sulfate and Morphine sulfate   Social History   Socioeconomic History   Marital status: Single    Spouse name: Not on file   Number of children: 2   Years of education: Not on file   Highest education level: Not on file   Occupational History   Occupation: Retired    Fish farm manager: RETIRED  Tobacco Use   Smoking status: Former    Packs/day: 1.00    Years:  55.00    Total pack years: 55.00    Types: Cigarettes    Quit date: 09/29/2006    Years since quitting: 15.4   Smokeless tobacco: Never  Vaping Use   Vaping Use: Never used  Substance and Sexual Activity   Alcohol use: No   Drug use: No   Sexual activity: Not Currently  Other Topics Concern   Not on file  Social History Narrative   Lives with her son but he works all day. Home for dinner.  Independent ADL. Needs assist on some IADL. Right handed    Social Determinants of Health   Financial Resource Strain: Low Risk  (06/07/2021)   Overall Financial Resource Strain (CARDIA)    Difficulty of Paying Living Expenses: Not hard at all  Food Insecurity: No Food Insecurity (06/07/2021)   Hunger Vital Sign    Worried About Running Out of Food in the Last Year: Never true    Ran Out of Food in the Last Year: Never true  Transportation Needs: No Transportation Needs (06/07/2021)   PRAPARE - Hydrologist (Medical): No    Lack of Transportation (Non-Medical): No  Physical Activity: Inactive (06/07/2021)   Exercise Vital Sign    Days of Exercise per Week: 0 days    Minutes of Exercise per Session: 0 min  Stress: No Stress Concern Present (06/07/2021)   [place]    Feeling of Stress : Not at all  Social Connections: Socially Isolated (06/07/2021)   Social Connection and Isolation Panel [NHANES]    Frequency of Communication with Friends and Family: Once a week    Frequency of Social Gatherings with Friends and Family: Once a week    Attends Religious Services: Never    Marine scientist or Organizations: No    Attends Music therapist: Never    Marital Status: Never married     Family History: The patient's family history includes Bone  cancer in her brother; Cancer in her maternal grandfather, paternal grandfather, and paternal grandmother; Cancer (age of onset: 66) in her father; Diabetes in her son and son; Heart attack in her son; Heart attack (age of onset: 71) in her mother; Hyperlipidemia in her son; Hypertension in her son; Liver cancer in her brother. There is no history of Colon cancer, Ovarian cancer, or Uterine cancer.  ROS:   Please see the history of present illness.    (+) Bilateral knee pain (+) Shortness of breath  All other systems reviewed and are negative.  EKGs/Labs/Other Studies Reviewed:    EKG:  EKG is personally reviewed.  02/22/22: EKG was not ordered.   08/27/20: Sinus rhythm.  Rate 73 bpm.  First-degree block.  Other studies reviewed:  Bilateral Renal Artery Doppler 07/29/2020: Summary:  Renal:  Right: Normal size right kidney. RRV flow present. No evidence of right renal artery stenosis.   Left:  Normal size of left kidney. LRV flow present. No evidence of left renal artery stenosis.     Echo 02/2014: Study Conclusions   - Left ventricle: The cavity size was normal. Systolic function was normal. The estimated ejection fraction was in the range of 60% to 65%. Wall motion was normal; there were no regional wall motion abnormalities.  - Aortic valve: There was mild to moderate regurgitation.  - Mitral valve: There was mild regurgitation.  - Left atrium: The atrium was mildly dilated.  - Pericardium, extracardiac: A trivial  pericardial effusion was    identified posterior to the heart.   Recent Labs: 07/08/2021: Hemoglobin 12.9; Platelets 229.0 10/14/2021: ALT 16; BUN 30; Creatinine, Ser 1.13; Potassium 4.6; Sodium 134   Recent Lipid Panel    Component Value Date/Time   CHOL 133 07/08/2021 1612   TRIG (H) 07/08/2021 1612    430.0 Triglyceride is over 400; calculations on Lipids are invalid.   TRIG 1693 (HH) 07/28/2006 1202   HDL 44.30 07/08/2021 1612   CHOLHDL 3 07/08/2021 1612   VLDL  UNABLE TO CALCULATE IF TRIGLYCERIDE OVER 400 mg/dL 02/19/2014 0228   LDLCALC 42 02/18/2020 1529   LDLDIRECT 44.0 07/08/2021 1612    Physical Exam:    BP 133/67   Pulse 78   Ht '5\' 5"'  (1.651 m)   Wt 186 lb 9.6 oz (84.6 kg)   BMI 31.05 kg/m  GENERAL: Sounds well. RESP: Respirations unlabored NEURO:  Speech fluent.  PSYCH:  Cognitively intact, oriented to person place and time   ASSESSMENT:    1. Coronary artery disease involving native coronary artery of native heart without angina pectoris   2. Essential hypertension   3. Pure hypercholesterolemia       PLAN:    CAD- RCA PCI '90s, STEMI-RCA DES 02/18/14 She has no angina.  Lipids are well-controlled.  Continue aspirin, carvedilol, clopidogrel, amlodipine, rosuvastatin, and Vascepa.  Essential hypertension Overall her blood pressures have been more stable but not ideal.  We noted that she is not taking her 12-hour medicines exactly 12 hours apart and I think she is leaving long gaps between the evening and her next dose in the morning.  She will start taking her twice daily medicines that 8 AM and 8 PM.  She will take her hydralazine then as well a 2 PM.  She only takes a second dose of amlodipine if her blood pressures are elevated in the evening.  She will change the timing of her medications and track them.  For now continue amlodipine, carvedilol, enalapril, spironolactone, and hydralazine.  She has had significant hypotension in the past so we will not be more aggressive with her regimen at this time.  Hyperlipidemia LDL goal is less than 70.  Lipids are well-controlled on her current regimen.   Time:   Today, I have spent 22 minutes with the patient with telehealth technology discussing the above problems.    Disposition:    FU with Mateya Torti C. Oval Linsey, MD, [facility] in 1 month virtually.   Medication Adjustments/Labs and Tests Ordered: Current medicines are reviewed at length with the patient today.  Concerns regarding  medicines are outlined above.   No orders of the defined types were placed in this encounter.  No orders of the defined types were placed in this encounter.   I,Breanna Adamick,acting as a scribe for Skeet Latch, MD.,have documented all relevant documentation on the behalf of Skeet Latch, MD,as directed by  Skeet Latch, MD while in the presence of Skeet Latch, MD.    I, [place] Oval Linsey, MD have reviewed all documentation for this visit.  The documentation of the exam, diagnosis, procedures, and orders on 02/22/2022 are all accurate and complete.   Signed, Skeet Latch, MD  02/22/2022 4:30 PM    [place]

## 2022-02-22 NOTE — Progress Notes (Deleted)
Virtual Visit via Video Note   This visit type was conducted due to national recommendations for restrictions regarding the COVID-19 Pandemic (e.g. social distancing) in an effort to limit this patient's exposure and mitigate transmission in our community.  Due to her co-morbid illnesses, this patient is at least at moderate risk for complications without adequate follow up.  This format is felt to be most appropriate for this patient at this time.  All issues noted in this document were discussed and addressed.  A limited physical exam was performed with this format.  Please refer to the patient's chart for her consent to telehealth for [facility].  The patient was identified using 2 identifiers.  Patient Location: Home Physician Location: Office  Date:  02/22/2022   ID:  Michelle [name], DOB 1933-04-06, MRN [id number]  PCP:  [name], MD  Cardiologist:  Lauree Chandler, MD  Nephrologist:  Referring MD: [name], MD   CC: Hypertension  History of Present Illness:    Michelle [name] is a 88 y.o. female with a hx of CAD status post angioplasty of the RCA in 1990 and inferior STEMI 02/2014, diabetes, hypertension, hyperlipidemia, prior tobacco abuse, and chronic diastolic heart failure here for follow-up. She initially established care in the hypertension clinic 09/03/2020. She was admitted for an inferior STEMI 02/2014 and had a completely occluded RCA with an aneurysm that formed prior to the angioplasty site the aneurysmal segment was treated with 2 covered stents.  She did have a small pericardial effusion post MI.  Follow-up echo revealed resolution of her pericardial effusion and normal systolic function.  She was last seen by [facility] cardiology 05/2019.  At that appointment her blood pressure was 138/62 and she was doing well.  At some point metoprolol was started instead of carvedilol 12.'5mg'$ .  Ms. Alemany was admitted 08/2020 with confusion.  Her son found her in the  bed unresponsive with her jaws clenched.  Her BP at presentation was 186/81.  She is thought to have hypertensive encephalopathy and delirium.  Head CT was unremarkable.  Her symptoms improved with better BP control.  Her home amlodipine, enalapril, and spironolactone were continued.  Metoprolol was increased and hydralazine was added.  Her dose of furosemide was reduced due to concern for dehydration. Her blood pressure was controlled at her visit on 08/2020.  Cortisol was within normal limits.  Renal artery Dopplers were - 07/2020.  She reported home blood pressures averaging within 130s to 140s but ranging from 110s to 160s. She noticed her blood pressure was highest in the evenings. Hydralazine was increased to 50 mg and carvedilol was increased to 25 mg.  At the last visit she was feeling very fatigued.  Her blood pressure was 59/27 and improved to 86/44 on repeat..  In general her blood pressures had been stable ranging from the 120s to 140s over 60s to 80s.  She also reported respiratory symptoms with cough and phlegm.  We recommended holding her antihypertensives and that she follow-up with her PCP.  Hydralazine was reduced to 25 mg.  She saw her PCP on 01/2022 and her blood pressure was 93/50.  Her home readings were 120s to 130s in the morning and then 150s to 160s later in the day.  Her PCP recommended splitting her amlodipine and taking 5 in the morning with an additional 5 mg in the afternoon only if needed.   Past Medical History:  Diagnosis Date   Anginal pain ([place])  Arthritis    AV block, 1st degree    CAD (coronary artery disease) 1990; 2015   Cardiac cath 1990 with Dr. Lia Foyer and [name] Residual CAD, non obstructive per 2015 cath. STEMI status post stent in August 2015.   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 02/18/14   Promus DES to RCA with STEMI   Shortness of breath     Past Surgical History:  Procedure Laterality Date   CATARACT EXTRACTION  2020   right and left eye    CHOLECYSTECTOMY     thinks her appendix was removed at the same time   [place]  02/18/14   Promus DES to RCA   [place] N/A 02/18/2014   Procedure: [place];  Surgeon: Jettie Booze, MD;  Location: [facility] CATH LAB;  Service: Cardiovascular;  Laterality: N/A;   PTCA  1990   PTCA of RCA   TRANSTHORACIC ECHOCARDIOGRAM  02/02/2012   mild LVH, EF 55-60%, Normal WM, Gr 1 DD; Mild MR    Current Medications: No outpatient medications have been marked as taking for the 02/22/22 encounter (Appointment) with Skeet Latch, MD.     Allergies:   Codeine sulfate and Morphine sulfate   Social History   Socioeconomic History   Marital status: Single    Spouse name: Not on file   Number of children: 2   Years of education: Not on file   Highest education level: Not on file  Occupational History   Occupation: Retired    Fish farm manager: RETIRED  Tobacco Use   Smoking status: Former    Packs/day: 1.00    Years: 55.00    Total pack years: 55.00    Types: Cigarettes    Quit date: 09/29/2006    Years since quitting: 15.4   Smokeless tobacco: Never  Vaping Use   Vaping Use: Never used  Substance and Sexual Activity   Alcohol use: No   Drug use: No   Sexual activity: Not Currently  Other Topics Concern   Not on file  Social History Narrative   Lives with her son but he works all day. Home for dinner.  Independent ADL. Needs assist  on some IADL. Right handed    Social Determinants of Health   Financial Resource Strain: Low Risk  (06/07/2021)   Overall Financial Resource Strain (CARDIA)    Difficulty of Paying Living Expenses: Not hard at all  Food Insecurity: No Food Insecurity (06/07/2021)   Hunger Vital Sign    Worried About Running Out of Food in the Last Year: Never true    Ran Out of Food in the Last Year: Never true  Transportation Needs: No Transportation Needs (06/07/2021)   PRAPARE - Hydrologist (Medical): No    Lack of Transportation (Non-Medical): No  Physical Activity: Inactive (06/07/2021)   Exercise Vital Sign    Days of Exercise per Week: 0 days    Minutes of Exercise  per Session: 0 min  Stress: No Stress Concern Present (06/07/2021)   [place]    Feeling of Stress : Not at all  Social Connections: Socially Isolated (06/07/2021)   Social Connection and Isolation Panel [NHANES]    Frequency of Communication with Friends and Family: Once a week    Frequency of Social Gatherings with Friends and Family: Once a week    Attends Religious Services: Never    Marine scientist or Organizations: No    Attends Music therapist: Never    Marital Status: Never married     Family History: The patient's family history includes Bone cancer in her brother; Cancer in her maternal grandfather, paternal grandfather, and paternal grandmother; Cancer (age of onset: 62) in her father; Diabetes in her son and son; Heart attack in her son; Heart attack (age of onset: 72) in her mother; Hyperlipidemia in her son; Hypertension in her son; Liver cancer in her brother. There is no history of Colon cancer, Ovarian cancer, or Uterine cancer.  ROS:   Please see the history of present illness.    (+) Fatigue (+) Productive cough (+) Bilateral pain/weakness All other systems reviewed and are  negative.  EKGs/Labs/Other Studies Reviewed:    EKG:  EKG was not ordered today 08/27/20: Sinus rhythm.  Rate 73 bpm.  First-degree block.  Bilateral Renal Artery Doppler 07/29/2020: Summary:  Renal:  Right: Normal size right kidney. RRV flow present. No evidence of right renal artery stenosis.   Left:  Normal size of left kidney. LRV flow present. No evidence of left renal artery stenosis.     Echo 02/2014: Study Conclusions   - Left ventricle: The cavity size was normal. Systolic function was normal. The estimated ejection fraction was in the range of 60% to 65%. Wall motion was normal; there were no regional wall motion abnormalities.  - Aortic valve: There was mild to moderate regurgitation.  - Mitral valve: There was mild regurgitation.  - Left atrium: The atrium was mildly dilated.  - Pericardium, extracardiac: A trivial pericardial effusion was    identified posterior to the heart.   Recent Labs: 07/08/2021: Hemoglobin 12.9; Platelets 229.0 10/14/2021: ALT 16; BUN 30; Creatinine, Ser 1.13; Potassium 4.6; Sodium 134   Recent Lipid Panel    Component Value Date/Time   CHOL 133 07/08/2021 1612   TRIG (H) 07/08/2021 1612    430.0 Triglyceride is over 400; calculations on Lipids are invalid.   TRIG 1693 (HH) 07/28/2006 1202   HDL 44.30 07/08/2021 1612   CHOLHDL 3 07/08/2021 1612   VLDL UNABLE TO CALCULATE IF TRIGLYCERIDE OVER 400 mg/dL 02/19/2014 0228   LDLCALC 42 02/18/2020 1529   LDLDIRECT 44.0 07/08/2021 1612    Physical Exam:    There were no vitals taken for this visit. GENERAL: Lethargic-appearing.  No acute distress. HEENT: Pupils equal round.  Oral mucosa unremarkable NECK:  No jugular venous distention, no visible thyromegaly EXT:  No edema, no cyanosis no clubbing SKIN:  No rashes no nodules NEURO:  Speech fluent.  Cranial nerves grossly intact.  Moves all 4 extremities freely PSYCH:  Cognitively intact, oriented to person place and time  ASSESSMENT:     No diagnosis found.    PLAN:    No problem-specific Assessment & Plan notes found for this encounter.     COVID-19 Education: The signs and symptoms of COVID-19 were discussed with the patient and how to seek care for  testing (follow up with PCP or arrange E-visit).  The importance of social distancing was discussed today.  Time:   Today, I have spent 35 minutes with the patient with telehealth technology discussing the above problems.    Disposition:    FU with Ripley Bogosian C. Oval Linsey, MD, [facility] in 2-3 weeks virtually.   Medication Adjustments/Labs and Tests Ordered: Current medicines are reviewed at length with the patient today.  Concerns regarding medicines are outlined above.   No orders of the defined types were placed in this encounter.  No orders of the defined types were placed in this encounter.    I, Larenz Frasier C. Oval Linsey, MD have reviewed all documentation for this visit.  The documentation of the exam, diagnosis, procedures, and orders on 02/22/2022 are all accurate and complete.   Signed, Skeet Latch, MD  02/22/2022 8:39 AM    [place]

## 2022-02-22 NOTE — Assessment & Plan Note (Addendum)
She has no angina.  Lipids are well-controlled.  Continue aspirin, carvedilol, clopidogrel, amlodipine, rosuvastatin, and Vascepa.

## 2022-02-23 ENCOUNTER — Telehealth: Payer: Self-pay | Admitting: Family Medicine

## 2022-02-23 NOTE — Telephone Encounter (Signed)
.  Home Health Certification or Plan of Care Tracking  Is this a Certification or Plan of Care? yes  [facility] Agency: [facility]  Order Number:  [id number]  Has charge sheet been attached? yes  Where has form been placed:  In provider's box  Faxed to:   [phone]

## 2022-02-24 NOTE — Telephone Encounter (Signed)
Noted  

## 2022-03-02 ENCOUNTER — Encounter (HOSPITAL_COMMUNITY): Payer: Self-pay | Admitting: Emergency Medicine

## 2022-03-02 ENCOUNTER — Other Ambulatory Visit: Payer: Self-pay

## 2022-03-02 ENCOUNTER — Emergency Department (HOSPITAL_COMMUNITY): Payer: Medicare HMO

## 2022-03-02 ENCOUNTER — Emergency Department (HOSPITAL_COMMUNITY)
Admission: EM | Admit: 2022-03-02 | Discharge: 2022-03-02 | Disposition: A | Discharge status: Home / Self Care | Payer: Medicare HMO | Attending: Emergency Medicine | Admitting: Emergency Medicine

## 2022-03-02 ENCOUNTER — Other Ambulatory Visit (HOSPITAL_COMMUNITY): Payer: Self-pay

## 2022-03-02 ENCOUNTER — Telehealth: Payer: Self-pay | Admitting: Family Medicine

## 2022-03-02 DIAGNOSIS — I252 Old myocardial infarction: Secondary | ICD-10-CM | POA: Insufficient documentation

## 2022-03-02 DIAGNOSIS — R4182 Altered mental status, unspecified: Secondary | ICD-10-CM

## 2022-03-02 DIAGNOSIS — R55 Syncope and collapse: Secondary | ICD-10-CM | POA: Diagnosis not present

## 2022-03-02 DIAGNOSIS — Z7902 Long term (current) use of antithrombotics/antiplatelets: Secondary | ICD-10-CM | POA: Diagnosis not present

## 2022-03-02 DIAGNOSIS — E119 Type 2 diabetes mellitus without complications: Secondary | ICD-10-CM | POA: Diagnosis not present

## 2022-03-02 DIAGNOSIS — J449 Chronic obstructive pulmonary disease, unspecified: Secondary | ICD-10-CM | POA: Diagnosis not present

## 2022-03-02 DIAGNOSIS — Z7982 Long term (current) use of aspirin: Secondary | ICD-10-CM | POA: Diagnosis not present

## 2022-03-02 DIAGNOSIS — R569 Unspecified convulsions: Secondary | ICD-10-CM | POA: Diagnosis not present

## 2022-03-02 DIAGNOSIS — Z7984 Long term (current) use of oral hypoglycemic drugs: Secondary | ICD-10-CM | POA: Insufficient documentation

## 2022-03-02 DIAGNOSIS — G4489 Other headache syndrome: Secondary | ICD-10-CM | POA: Diagnosis not present

## 2022-03-02 DIAGNOSIS — R531 Weakness: Secondary | ICD-10-CM | POA: Diagnosis not present

## 2022-03-02 DIAGNOSIS — G40909 Epilepsy, unspecified, not intractable, without status epilepticus: Secondary | ICD-10-CM | POA: Diagnosis not present

## 2022-03-02 DIAGNOSIS — R059 Cough, unspecified: Secondary | ICD-10-CM | POA: Diagnosis not present

## 2022-03-02 DIAGNOSIS — R0902 Hypoxemia: Secondary | ICD-10-CM | POA: Diagnosis not present

## 2022-03-02 LAB — CBC WITH DIFFERENTIAL/PLATELET
Abs Immature Granulocytes: 0.05 10*3/uL (ref 0.00–0.07)
Basophils Absolute: 0 10*3/uL (ref 0.0–0.1)
Basophils Relative: 0 %
Eosinophils Absolute: 0 10*3/uL (ref 0.0–0.5)
Eosinophils Relative: 0 %
HCT: 39.7 % (ref 36.0–46.0)
Hemoglobin: 13 g/dL (ref 12.0–15.0)
Immature Granulocytes: 1 %
Lymphocytes Relative: 16 %
Lymphs Abs: 1.3 10*3/uL (ref 0.7–4.0)
MCH: 28.6 pg (ref 26.0–34.0)
MCHC: 32.7 g/dL (ref 30.0–36.0)
MCV: 87.3 fL (ref 80.0–100.0)
Monocytes Absolute: 0.4 10*3/uL (ref 0.1–1.0)
Monocytes Relative: 5 %
Neutro Abs: 6.1 10*3/uL (ref 1.7–7.7)
Neutrophils Relative %: 78 %
Platelets: 303 10*3/uL (ref 150–400)
RBC: 4.55 MIL/uL (ref 3.87–5.11)
RDW: 13.2 % (ref 11.5–15.5)
WBC: 7.9 10*3/uL (ref 4.0–10.5)
nRBC: 0 % (ref 0.0–0.2)

## 2022-03-02 LAB — URINALYSIS, ROUTINE W REFLEX MICROSCOPIC
Bilirubin Urine: NEGATIVE
Glucose, UA: NEGATIVE mg/dL
Hgb urine dipstick: NEGATIVE
Ketones, ur: NEGATIVE mg/dL
Leukocytes,Ua: NEGATIVE
Nitrite: NEGATIVE
Protein, ur: NEGATIVE mg/dL
Specific Gravity, Urine: 1.011 (ref 1.005–1.030)
pH: 5 (ref 5.0–8.0)

## 2022-03-02 LAB — COMPREHENSIVE METABOLIC PANEL
ALT: 19 U/L (ref 0–44)
AST: 22 U/L (ref 15–41)
Albumin: 3.9 g/dL (ref 3.5–5.0)
Alkaline Phosphatase: 39 U/L (ref 38–126)
Anion gap: 12 (ref 5–15)
BUN: 28 mg/dL — ABNORMAL HIGH (ref 8–23)
CO2: 21 mmol/L — ABNORMAL LOW (ref 22–32)
Calcium: 10.1 mg/dL (ref 8.9–10.3)
Chloride: 99 mmol/L (ref 98–111)
Creatinine, Ser: 0.94 mg/dL (ref 0.44–1.00)
GFR, Estimated: 59 mL/min — ABNORMAL LOW (ref >60)
Glucose, Bld: 190 mg/dL — ABNORMAL HIGH (ref 70–99)
Potassium: 4.6 mmol/L (ref 3.5–5.1)
Sodium: 132 mmol/L — ABNORMAL LOW (ref 135–145)
Total Bilirubin: 0.5 mg/dL (ref 0.3–1.2)
Total Protein: 7.3 g/dL (ref 6.5–8.1)

## 2022-03-02 LAB — CBG MONITORING, ED: Glucose-Capillary: 208 mg/dL — ABNORMAL HIGH (ref 70–99)

## 2022-03-02 LAB — TROPONIN I (HIGH SENSITIVITY)
Troponin I (High Sensitivity): 7 ng/L (ref <18)
Troponin I (High Sensitivity): 5 ng/L (ref <18)

## 2022-03-02 MED ORDER — DICLOFENAC SODIUM 1 % EX GEL: 2.0000 g | Freq: Three times a day (TID) | CUTANEOUS | 0 refills | Status: DC | PRN

## 2022-03-02 MED ORDER — ACETAMINOPHEN 325 MG PO TABS
650.0000 mg | ORAL_TABLET | Freq: Once | ORAL | Status: AC
Start: 2022-03-02 — End: 2022-03-02
  Administered 2022-03-02: 650 mg via ORAL
  Filled 2022-03-02: qty 2

## 2022-03-02 MED ORDER — HYDRALAZINE HCL 25 MG PO TABS
50.0000 mg | ORAL_TABLET | Freq: Once | ORAL | Status: AC
Start: 2022-03-02 — End: 2022-03-02
  Administered 2022-03-02: 50 mg via ORAL
  Filled 2022-03-02: qty 2

## 2022-03-02 MED ORDER — DICLOFENAC SODIUM 1 % EX GEL
2.0000 g | Freq: Three times a day (TID) | CUTANEOUS | 0 refills | Status: DC | PRN
  Filled 2022-03-02: qty 100, 17d supply, fill #0

## 2022-03-02 NOTE — ED Notes (Addendum)
RN start of shift assessment: Pt in bed, high fowler position, alert, not oriented, but at baseline mentation per family at beside. Pt requesting to use restroom. Pt assisted to bedside commode. IV (not charted documented in chart by previous RN - 20g left hand) dislodged during transfer, large hematoma forming on left hand, IV removed and area wrapped. Pt denies dizziness, weakness, shortness of breath, nausea, chest pain. Pt assisted back to bed.

## 2022-03-02 NOTE — Discharge Instructions (Addendum)
Please follow up with your neurologist or primary care doctor for Lamictal level and possible break through seizure.

## 2022-03-02 NOTE — Telephone Encounter (Signed)
FYI  Caller states: - She wanted to let PCP know that patient was unresponsive while on toilet this morning. She has been taken to [facility] via EMS.

## 2022-03-02 NOTE — ED Provider Notes (Signed)
[facility] EMERGENCY DEPARTMENT Provider Note   CSN: [id number] Arrival date & time: 03/02/22  1035     History  Chief Complaint  Patient presents with   Near Syncope    Michelle [name] is a 88 y.o. female.  Patient is an 88 year old female with past medical history of pretension, hyperlipidemia, diabetes, postmyocardial infarction pericarditis, and COPD presenting for episode of unresponsiveness.  Patient's family is at the bedside states she went to the bathroom and was in the bathroom for prolonged period of time.  States they went in there to check on her and she was sitting on the toilet staring.  This episode lasted until EMS arrived.  She was otherwise awake and staring to the back of the room.  Full body shaking.  No tongue biting teeth grinding.  They have had episodes like this in the past secondary to patient's seizure history.  Patient currently compliant with Lamictal daily.  Admits to recent insomnia.  Denies any recent falls or head traumas.  Denies any fevers, chills, nausea, vomiting, diarrhea.  Denies dehydration or decreased appetite.  The history is provided by the patient. No language interpreter was used.  Near Syncope Pertinent negatives include no chest pain, no abdominal pain and no shortness of breath.       Home Medications Prior to Admission medications   Medication Sig Start Date End Date Taking? Authorizing Provider  diclofenac Sodium (VOLTAREN) 1 % GEL Apply 2 g topically 3 (three) times daily as needed (pain). 1/93/79  Yes Campbell Stall P, DO  acetaminophen (TYLENOL) 650 MG CR tablet Take 1,300 mg by mouth 3 (three) times daily. Patient take 3 times a day.    [provider]  albuterol (PROVENTIL) (2.5 MG/3ML) 0.083% nebulizer solution USE THREE MILLILITERS VIA NEBULIZATION BY MOUTH EVERY 6 HOURS AS NEEDED FOR WHEEZING OR SHORTNESS OF BREATH 10/12/21   [name], MD  albuterol (VENTOLIN HFA) 108 (90 Base) MCG/ACT  inhaler INHALE 1 PUFF EVERY 4 HOURS AS NEEDED FOR WHEEZING, SHORTNESS OF BREATH (RESCUE INHALER IF ADVAIR NOT WORKING) 10/13/21   [name], MD  Alcohol Swabs (DROPSAFE ALCOHOL PREP) 70 % PADS USE  FOR  TESTING THREE TIMES DAILY 02/02/22   [name], MD  amLODipine (NORVASC) 5 MG tablet Take 5 mg in the morning. If afternoon blood pressure >145/90 take second tablet 02/07/22   [name], MD  aspirin 81 MG tablet Take 81 mg by mouth daily with breakfast. 07/02/14   [provider]  Blood Glucose Calibration (ACCU-CHEK SMARTVIEW CONTROL) LIQD  01/11/17   [provider]  blood glucose meter kit and supplies KIT Dispense based on patient and insurance preference. Use up to four times daily as directed. Dx:E11.9 04/01/21   [name], MD  calcium carbonate (OSCAL) 1500 (600 Ca) MG TABS tablet Take 600 mg of elemental calcium by mouth daily with breakfast.    [provider]  carvedilol (COREG) 12.5 MG tablet TAKE 1 TABLET TWICE DAILY 02/16/22   Skeet Latch, MD  cholecalciferol (VITAMIN D3) 25 MCG (1000 UNIT) tablet Take 1,000 Units by mouth daily with breakfast.    [provider]  clopidogrel (PLAVIX) 75 MG tablet TAKE 1 TABLET EVERY DAY 09/08/21   [name], MD  DROPLET PEN NEEDLES 31G X 8 MM MISC USE AS DIRECTED WITH LANTUS SOLOSTAR PEN 01/03/22   [name], MD  enalapril (VASOTEC) 20 MG tablet TAKE 1 TABLET TWICE DAILY  11/10/21   [name], MD  fenofibrate micronized (LOFIBRA) 134 MG capsule TAKE 1 CAPSULE EVERY DAY 06/07/21   [name], MD  ferrous sulfate 325 (65 FE) MG tablet Twice a week Patient taking differently: Take 325 mg by mouth 2 (two) times a week. 02/21/20   [name], MD  fluticasone-salmeterol (ADVAIR) 250-50 MCG/ACT AEPB INHALE 1 PUFF TWICE DAILY 09/08/21   [name], MD  folic acid (FOLVITE) 062 MCG tablet Take 400 mcg by mouth daily with breakfast.    [provider]   furosemide (LASIX) 20 MG tablet TAKE 1 TABLET EVERY DAY 06/30/21   Skeet Latch, MD  hydrALAZINE (APRESOLINE) 50 MG tablet Take 1 tablet (50 mg total) by mouth 3 (three) times daily. 11/12/21   Skeet Latch, MD  lamoTRIgine (LAMICTAL) 25 MG tablet Take 1 tablet twice a day 08/19/21   [name], MD  Lancets Misc. (ACCU-CHEK FASTCLIX LANCET) KIT 1 Device by Does not apply route. Use as directed for testing blood sugar    [provider]  Lancets Misc. (ACCU-CHEK FASTCLIX LANCET) KIT Use as directed to test blood sugar 11/13/20   [name], MD  LANTUS SOLOSTAR 100 UNIT/ML Solostar Pen INJECT  17  TO  20 UNITS SUBCUTANEOUSLY EVERY MORNING 11/29/21   [name], MD  loratadine (CLARITIN) 10 MG tablet Take 10 mg by mouth every other day.    [provider]  Melatonin 5 MG CAPS Take 5 mg by mouth at bedtime.    [provider]  metFORMIN (GLUCOPHAGE-XR) 500 MG 24 hr tablet TAKE ONE TABLET BY MOUTH IN THE MORNING AND AT BEDTIME 01/10/22   [name], MD  nitroGLYCERIN (NITROSTAT) 0.4 MG SL tablet DISSOLVE 1 TAB UNDER TONGUE FOR CHEST PAIN - IF PAIN REMAINS AFTER 5 MIN, CALL 911 AND REPEAT DOSE. MAX 3 TABS IN 15 MINUTES 06/15/21   Skeet Latch, MD  pantoprazole (PROTONIX) 40 MG tablet TAKE 1 TABLET EVERY DAY 10/07/21   [name], MD  rosuvastatin (CRESTOR) 40 MG tablet TAKE 1 TABLET EVERY DAY 10/07/21   [name], MD  sertraline (ZOLOFT) 50 MG tablet TAKE 2 TABLETS AT BEDTIME 12/08/21   [name], MD  SPIRIVA HANDIHALER 18 MCG inhalation capsule PLACE 1 CAPSULE INTO HANDIHALER AND INHALE THE CONTENTS DAILY 02/02/22   [name], MD  spironolactone (ALDACTONE) 25 MG tablet TAKE 1 TABLET (25 MG TOTAL) BY MOUTH DAILY. 08/02/21   [name], MD  TRUE METRIX BLOOD GLUCOSE TEST test strip TEST BLOOD SUGAR  UP  TO FOUR TIMES DAILY AS DIRECTED 06/07/21   [name], MD  TRUEplus Lancets 33G MISC TEST BLOOD SUGAR  UP TO FOUR TIMES DAILY AS DIRECTED 06/07/21   [name], MD  VASCEPA 1 g capsule TAKE 2 CAPSULES TWICE DAILY 12/08/21   Skeet Latch, MD  vitamin B-12 (CYANOCOBALAMIN) 1000 MCG tablet Take 1,000 mcg by mouth daily with breakfast.    [provider]  vitamin E 400 UNIT capsule Take 400 Units by mouth at bedtime.    [provider]      Allergies    Codeine sulfate and Morphine sulfate    Review of Systems   Review of Systems  Constitutional:  Negative for chills and fever.  HENT:  Negative for ear pain and sore throat.   Eyes:  Negative for pain and visual disturbance.  Respiratory:  Negative for cough and shortness  of breath.   Cardiovascular:  Positive for near-syncope. Negative for chest pain and palpitations.  Gastrointestinal:  Negative for abdominal pain and vomiting.  Genitourinary:  Negative for dysuria and hematuria.  Musculoskeletal:  Negative for arthralgias and back pain.  Skin:  Negative for color change and rash.  Neurological:  Positive for seizures (possible seizure like activity). Negative for syncope.  All other systems reviewed and are negative.   Physical Exam Updated Vital Signs BP (!) 156/75   Pulse 92   Temp 97.9 F (36.6 C) (Oral)   Resp 15   SpO2 96%  Physical Exam Vitals and nursing note reviewed.  Constitutional:      General: She is not in acute distress.    Appearance: She is well-developed.  HENT:     Head: Normocephalic and atraumatic.  Eyes:     General: Lids are normal. Vision grossly intact.     Conjunctiva/sclera: Conjunctivae normal.     Pupils: Pupils are equal, round, and reactive to light.  Cardiovascular:     Rate and Rhythm: Normal rate and regular rhythm.     Heart sounds: No murmur heard. Pulmonary:     Effort: Pulmonary effort is normal. No respiratory distress.     Breath sounds: Normal breath sounds.  Abdominal:     Palpations: Abdomen is soft.     Tenderness: There is no abdominal  tenderness.  Musculoskeletal:        General: No swelling.     Cervical back: Neck supple.  Skin:    General: Skin is warm and dry.     Capillary Refill: Capillary refill takes less than 2 seconds.  Neurological:     General: No focal deficit present.     Mental Status: She is alert and oriented to person, place, and time.     GCS: GCS eye subscore is 4. GCS verbal subscore is 5. GCS motor subscore is 6.     Cranial Nerves: Cranial nerves 2-12 are intact.     Sensory: Sensation is intact.     Motor: Motor function is intact.     Coordination: Coordination is intact.  Psychiatric:        Mood and Affect: Mood normal.     ED Results / Procedures / Treatments   Labs (all labs ordered are listed, but only abnormal results are displayed) Labs Reviewed  COMPREHENSIVE METABOLIC PANEL - Abnormal; Notable for the following components:      Result Value   Sodium 132 (*)    CO2 21 (*)    Glucose, Bld 190 (*)    BUN 28 (*)    GFR, Estimated 59 (*)    All other components within normal limits  CBG MONITORING, ED - Abnormal; Notable for the following components:   Glucose-Capillary 208 (*)    All other components within normal limits  URINE CULTURE  CBC WITH DIFFERENTIAL/PLATELET  URINALYSIS, ROUTINE W REFLEX MICROSCOPIC  BLOOD GAS, VENOUS  LAMOTRIGINE LEVEL  TROPONIN I (HIGH SENSITIVITY)  TROPONIN I (HIGH SENSITIVITY)    EKG EKG Interpretation  Date/Time:  Wednesday March 02 2022 10:48:28 EDT Ventricular Rate:  82 PR Interval:  208 QRS Duration: 94 QT Interval:  384 QTC Calculation: 448 R Axis:   -39 Text Interpretation: Normal sinus rhythm Left axis deviation Low voltage QRS Abnormal ECG When compared with ECG of 27-Aug-2020 15:29, PREVIOUS ECG IS PRESENT Confirmed by Campbell Stall ([id number]) on 12/22/3014 1:31:07 PM  Radiology DG Chest Portable 1 View  Result Date:  03/02/2022 CLINICAL DATA:  Cough altered mental status EXAM: PORTABLE CHEST 1 VIEW COMPARISON:  03/08/2021  FINDINGS: The heart size and mediastinal contours are within normal limits. Mild, diffuse bilateral interstitial pulmonary opacity, similar to prior examination. The visualized skeletal structures are unremarkable. IMPRESSION: Mild, diffuse bilateral interstitial pulmonary opacity, similar to prior examination and may reflect edema and/or chronic underlying interstitial change. No new or focal airspace opacity. Electronically Signed   By: Delanna Ahmadi M.D.   On: 03/02/2022 13:52   CT HEAD WO CONTRAST (5MM)  Result Date: 03/02/2022 CLINICAL DATA:  Mental status change EXAM: CT HEAD WITHOUT CONTRAST TECHNIQUE: Contiguous axial images were obtained from the base of the skull through the vertex without intravenous contrast. RADIATION DOSE REDUCTION: This exam was performed according to the departmental dose-optimization program which includes automated exposure control, adjustment of the mA and/or kV according to patient size and/or use of iterative reconstruction technique. COMPARISON:  CT head 08/12/2020 FINDINGS: Brain: Mild atrophy.  Moderate white matter hypodensity bilaterally. Negative for acute infarct, hemorrhage, mass Vascular: Negative for hyperdense vessel Skull: Negative Sinuses/Orbits: Paranasal sinuses clear. Bilateral cataract extraction Other: None IMPRESSION: No acute abnormality Atrophy and mild to moderate chronic microvascular ischemic changes stable from the prior study. Electronically Signed   By: Franchot Gallo M.D.   On: 03/02/2022 11:39    Procedures Procedures    Medications Ordered in ED Medications  acetaminophen (TYLENOL) tablet 650 mg (650 mg Oral Given 03/02/22 1530)  hydrALAZINE (APRESOLINE) tablet 50 mg (50 mg Oral Given 03/02/22 1530)    ED Course/ Medical Decision Making/ A&P                           Medical Decision Making Amount and/or Complexity of Data Reviewed Labs: ordered. Radiology: ordered.  Risk OTC drugs. Prescription drug management.   70:11  PM 88 year old female with past medical history of pretension, hyperlipidemia, diabetes, postmyocardial infarction pericarditis, and COPD presenting for episode of unresponsiveness.  Patient is alert oriented x3, no acute distress, afebrile, stable vital signs.  Physical exam demonstrates no neurovascular deficits.  Patient has a GCS of 15 and is alert and oriented x3.  States she is back to her baseline at this time.  No evidence of tongue biting.  Patient was on the toilet at the time so unknown urinary incontinence.  Break through seizure considered.  Family reports recent insomnia which we know can provoke seizures.  AMS/unresponsiveness -No anemia -Normal sodium levels and electrolytes -No severe AKI or clinical dehydration -Stable EKG, troponins, and chest x-ray.  No myocardial infarction. -No signs or symptoms of sepsis.  UA demonstrates no urinary tract infection. -Stroke considered however thought to be less likely at this time.  Patient is reported to have seizure-like activity similar to today's events.  CT head demonstrates no acute process.  Patient is back to her baseline at this time.  We will hold off on MRI.  Lamictal level ordered.  Appears to be back at her baseline at this time.  Recommended for close follow-up with neurology in the first week.        Final Clinical Impression(s) / ED Diagnoses Final diagnoses:  Altered mental status, unspecified altered mental status type  Seizure disorder ([place])    Rx / DC Orders ED Discharge Orders          Ordered    diclofenac Sodium (VOLTAREN) 1 % GEL  3 times daily PRN  03/02/22 1406              Campbell Stall P, DO 90/47/53 1620

## 2022-03-02 NOTE — Telephone Encounter (Signed)
FYI

## 2022-03-02 NOTE — ED Notes (Signed)
First attempt unsuccessful pt has specimen cup

## 2022-03-02 NOTE — Telephone Encounter (Signed)
That is the right move-thank you

## 2022-03-02 NOTE — ED Provider Triage Note (Signed)
Emergency Medicine Provider Triage Evaluation Note  Michelle [name] , a 88 y.o. female  was evaluated in triage.  Pt complains of possible syncopal episode.  Patient has a history of dementia is alert to person and place.  Per EMS husband states that this is her baseline.  EMS reports that patient stated that the patient used her walker to go to the restroom, he came in shortly after and found her sitting on the toilet "staring off."  Patient was not responding to him when she was staring off.  No reported falls or traumatic injuries.  EMS reports that patient has been alert to person and place with them.  Review of Systems  Positive:  Negative:   Unable to obtain due to dementia  Physical Exam  BP (!) 136/56 (BP Location: Left Arm)   Pulse 77   Temp 98.1 F (36.7 C) (Oral)   Resp 14   SpO2 90%  Gen:   Awake, no distress   Resp:  Normal effort, clear to auscultation bilaterally MSK:   Moves extremities without difficulty  Other:  No facial asymmetry or dysarthria.  Grip strength equal.  Head is atraumatic.  +2 radial pulse bilaterally.  Abdomen soft, nontender, nontender with no guarding or rebound tenderness.  Medical Decision Making  Medically screening exam initiated at 10:39 AM.  Appropriate orders placed.  Michelle [name] was informed that the remainder of the evaluation will be completed by another provider, this initial triage assessment does not replace that evaluation, and the importance of remaining in the ED until their evaluation is complete.  Unclear if patient had syncopal episode or seizure.  Will obtain broad work-up to look for possible cause of patient's transient altered mental status.   Loni Beckwith, [place] 03/02/22 1042

## 2022-03-02 NOTE — ED Triage Notes (Addendum)
BIB EMS from home.  Husband reported she used to walker to go to the restroom but he found her "staring off" and not responding to him on the toilet.  Pt was responsive to EMS.  Pt does have dementia. Somnolent.  No fall. 20G LH

## 2022-03-03 ENCOUNTER — Telehealth: Payer: Self-pay | Admitting: Family Medicine

## 2022-03-03 ENCOUNTER — Encounter: Payer: Self-pay | Admitting: Physician Assistant

## 2022-03-03 LAB — LAMOTRIGINE LEVEL: Lamotrigine Lvl: <1 ug/mL — ABNORMAL LOW (ref 2.0–20.0)

## 2022-03-03 LAB — URINE CULTURE: Culture: <10000 — AB

## 2022-03-03 NOTE — Telephone Encounter (Signed)
Called and lm on Marissa VM with VO.

## 2022-03-03 NOTE — Telephone Encounter (Signed)
Marissa from [facility] called requesting verbal order for social worker subsequent visit.   Order:   - 1 visit next week  Marissa can be reached at [phone] for the VO and a VM can be left at this number.

## 2022-03-09 ENCOUNTER — Other Ambulatory Visit: Payer: Self-pay | Admitting: Family Medicine

## 2022-03-14 ENCOUNTER — Telehealth: Payer: Self-pay | Admitting: Family Medicine

## 2022-03-14 NOTE — Telephone Encounter (Signed)
Caller States: -patient's blood pressure today 08/28 0700 130/65 0930 113/68 1350 106/61  -pt is supposed to take '50mg'$  of Hydralazine at 2:00pm each day. -he has held off on administering the medication because he is concerned it will push BP too low.   Caller referred to PCP triage nurse: warm transferred to [place], patient coordinator with [facility].  Awaiting follow up notes.

## 2022-03-14 NOTE — Telephone Encounter (Signed)
See below

## 2022-03-14 NOTE — Telephone Encounter (Signed)
Ok to hold a dose of blood pressure medicine if under 799 systolic  Michelle [name] and Michelle [name]- I was wondering if maybe we could review red flags for triage- I am having a lot of patients messages go to triage and for instance on this one patient with no reported symptoms- just a # of low blood pressure. I dont want to underutilize triage but think we may be able to review with staff and adjust- thanks!

## 2022-03-14 NOTE — Telephone Encounter (Signed)
Son states he was told that triage could not talk to him about medication change.   Is requesting call back as soon as possible with advise.

## 2022-03-15 NOTE — Telephone Encounter (Signed)
Called and spoke with pt son [name] and below message given.

## 2022-03-22 ENCOUNTER — Encounter: Payer: Self-pay | Admitting: Family Medicine

## 2022-03-22 ENCOUNTER — Ambulatory Visit (INDEPENDENT_AMBULATORY_CARE_PROVIDER_SITE_OTHER): Payer: Medicare HMO | Admitting: Family Medicine

## 2022-03-22 VITALS — BP 110/60 | HR 78 | Temp 98.9°F | Ht 65.0 in | Wt 187.0 lb

## 2022-03-22 DIAGNOSIS — J449 Chronic obstructive pulmonary disease, unspecified: Secondary | ICD-10-CM | POA: Diagnosis not present

## 2022-03-22 DIAGNOSIS — F0150 Vascular dementia without behavioral disturbance: Secondary | ICD-10-CM | POA: Diagnosis not present

## 2022-03-22 DIAGNOSIS — Z0000 Encounter for general adult medical examination without abnormal findings: Secondary | ICD-10-CM | POA: Diagnosis not present

## 2022-03-22 DIAGNOSIS — I2510 Atherosclerotic heart disease of native coronary artery without angina pectoris: Secondary | ICD-10-CM | POA: Diagnosis not present

## 2022-03-22 DIAGNOSIS — I5032 Chronic diastolic (congestive) heart failure: Secondary | ICD-10-CM | POA: Diagnosis not present

## 2022-03-22 DIAGNOSIS — Z794 Long term (current) use of insulin: Secondary | ICD-10-CM | POA: Diagnosis not present

## 2022-03-22 DIAGNOSIS — G40909 Epilepsy, unspecified, not intractable, without status epilepticus: Secondary | ICD-10-CM

## 2022-03-22 DIAGNOSIS — E119 Type 2 diabetes mellitus without complications: Secondary | ICD-10-CM | POA: Diagnosis not present

## 2022-03-22 MED ORDER — AMLODIPINE BESYLATE 2.5 MG PO TABS: ORAL_TABLET | ORAL | 3 refills | Status: DC

## 2022-03-22 NOTE — Progress Notes (Signed)
Phone [phone]   Subjective:  Patient presents today for their annual physical. Chief complaint-noted.   See problem oriented charting- ROS- full  review of systems was completed and negative except for: Knee pain, shortness of breath, sedentary activity  The following were reviewed and entered/updated in epic: Past Medical History:  Diagnosis Date   Anginal pain (HCC)    Arthritis    AV block, 1st degree    CAD (coronary artery disease) 1990; 2015   Cardiac cath 1990 with Dr. Lia Foyer and [name] Residual CAD, non obstructive per 2015 cath. STEMI status post stent in August 2015.   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   Promus DES to RCA with STEMI   Shortness of breath    Patient Active Problem List   Diagnosis Date Noted   Epilepsy ([place]) 03/12/2021    Priority: High   Dementia ([place]) 10/22/2020    Priority: High   Delirium 06/25/2020    Priority: High   Chronic diastolic CHF (congestive heart failure) ([place]) 03/13/2014    Priority: High   CAD- RCA PCI '90s, STEMI-RCA DES 02/18/14 03/05/2014    Priority: High   Type 2 diabetes mellitus without complication, with long-term current use of insulin ([place]) 03/26/2007    Priority: High   Anemia, iron deficiency 10/23/2015    Priority: Medium    CKD (chronic kidney disease), stage III ([place]) 07/25/2014    Priority: Medium    Osteoarthritis, knee 05/13/2014    Priority: Medium    Anxiety state 04/21/2014    Priority: Medium    SOB (shortness of breath) 03/05/2014     Priority: Medium    Hyperlipidemia associated with type 2 diabetes mellitus ([place]) 03/26/2007    Priority: Medium    Essential hypertension 04/21/2006    Priority: Medium    COPD (chronic obstructive pulmonary disease) ([place]) 04/21/2006    Priority: Medium    Actinic keratosis 11/17/2017    Priority: Low   Back pain, lumbosacral 10/10/2012    Priority: Low   Adnexal mass 11/24/2020   Thickened endometrium 11/24/2020   Past Surgical History:  Procedure Laterality Date   CATARACT EXTRACTION  2020   right and left eye    CHOLECYSTECTOMY     thinks her appendix was removed at the same time   [place]  02/18/14   Promus DES to RCA   [place] N/A 02/18/2014   Procedure: [place];  Surgeon: Jettie Booze, MD;  Location: [facility] CATH LAB;  Service: Cardiovascular;  Laterality: N/A;   PTCA  1990   PTCA of RCA   TRANSTHORACIC ECHOCARDIOGRAM  02/02/2012   mild LVH, EF 55-60%, Normal WM, Gr 1 DD; Mild MR    Family History  Problem Relation Age of Onset   Heart attack Mother 47   Cancer Father 65   Cancer Maternal Grandfather    Cancer Paternal Grandmother    Cancer Paternal Grandfather    Diabetes Son  Liver cancer Brother    Bone cancer Brother    Heart attack Son    Hypertension Son    Diabetes Son    Hyperlipidemia Son    Colon cancer Neg Hx    Ovarian cancer Neg Hx    Uterine cancer Neg Hx     Medications- reviewed and updated Current Outpatient Medications  Medication Sig Dispense Refill   acetaminophen (TYLENOL) 650 MG CR tablet Take 1,300 mg by mouth 3 (three) times daily. Patient take 3 times a day.     albuterol (PROVENTIL) (2.5 MG/3ML) 0.083% nebulizer solution USE THREE MILLILITERS VIA NEBULIZATION BY MOUTH EVERY 6 HOURS AS NEEDED FOR WHEEZING OR SHORTNESS OF BREATH 75 mL 3   albuterol (VENTOLIN HFA) 108 (90 Base) MCG/ACT inhaler INHALE 1 PUFF EVERY  4 HOURS AS NEEDED FOR WHEEZING, SHORTNESS OF BREATH (RESCUE INHALER IF ADVAIR NOT WORKING) 2 each 2   Alcohol Swabs (DROPSAFE ALCOHOL PREP) 70 % PADS USE  FOR  TESTING THREE TIMES DAILY 300 each 1   amLODipine (NORVASC) 5 MG tablet Take 5 mg in the morning. If afternoon blood pressure >145/90 take second tablet 180 tablet 3   aspirin 81 MG tablet Take 81 mg by mouth daily with breakfast.     Blood Glucose Calibration (ACCU-CHEK SMARTVIEW CONTROL) LIQD      blood glucose meter kit and supplies KIT Dispense based on patient and insurance preference. Use up to four times daily as directed. Dx:E11.9 1 each 3   calcium carbonate (OSCAL) 1500 (600 Ca) MG TABS tablet Take 600 mg of elemental calcium by mouth daily with breakfast.     carvedilol (COREG) 12.5 MG tablet TAKE 1 TABLET TWICE DAILY 180 tablet 3   cholecalciferol (VITAMIN D3) 25 MCG (1000 UNIT) tablet Take 1,000 Units by mouth daily with breakfast.     clopidogrel (PLAVIX) 75 MG tablet TAKE 1 TABLET EVERY DAY 90 tablet 1   diclofenac Sodium (VOLTAREN) 1 % GEL Apply 2 g topically 3 (three) times daily as needed (pain). 50 g 0   DROPLET PEN NEEDLES 31G X 8 MM MISC USE AS DIRECTED WITH LANTUS SOLOSTAR PEN 100 each 4   enalapril (VASOTEC) 20 MG tablet TAKE 1 TABLET TWICE DAILY 180 tablet 3   fenofibrate micronized (LOFIBRA) 134 MG capsule TAKE 1 CAPSULE EVERY DAY 90 capsule 3   ferrous sulfate 325 (65 FE) MG tablet Twice a week (Patient taking differently: Take 325 mg by mouth 2 (two) times a week.) 18 tablet 3   fluticasone-salmeterol (ADVAIR) 250-50 MCG/ACT AEPB INHALE 1 PUFF TWICE DAILY 062 each 3   folic acid (FOLVITE) 376 MCG tablet Take 400 mcg by mouth daily with breakfast.     furosemide (LASIX) 20 MG tablet TAKE 1 TABLET EVERY DAY 90 tablet 2   hydrALAZINE (APRESOLINE) 50 MG tablet Take 1 tablet (50 mg total) by mouth 3 (three) times daily. 270 tablet 1   lamoTRIgine (LAMICTAL) 25 MG tablet Take 1 tablet twice a day 180 tablet 3   Lancets  Misc. (ACCU-CHEK FASTCLIX LANCET) KIT 1 Device by Does not apply route. Use as directed for testing blood sugar     Lancets Misc. (ACCU-CHEK FASTCLIX LANCET) KIT Use as directed to test blood sugar 1 kit 1   LANTUS SOLOSTAR 100 UNIT/ML Solostar Pen INJECT  17  TO  20 UNITS SUBCUTANEOUSLY EVERY MORNING 30 mL 5   loratadine (CLARITIN) 10 MG tablet Take 10 mg by mouth every other day.  Melatonin 5 MG CAPS Take 5 mg by mouth at bedtime.     metFORMIN (GLUCOPHAGE-XR) 500 MG 24 hr tablet TAKE ONE TABLET BY MOUTH IN THE MORNING AND AT BEDTIME 180 tablet 3   nitroGLYCERIN (NITROSTAT) 0.4 MG SL tablet DISSOLVE 1 TAB UNDER TONGUE FOR CHEST PAIN - IF PAIN REMAINS AFTER 5 MIN, CALL 911 AND REPEAT DOSE. MAX 3 TABS IN 15 MINUTES 25 tablet 6   pantoprazole (PROTONIX) 40 MG tablet TAKE 1 TABLET EVERY DAY 90 tablet 1   rosuvastatin (CRESTOR) 40 MG tablet TAKE 1 TABLET EVERY DAY 90 tablet 0   sertraline (ZOLOFT) 50 MG tablet TAKE 2 TABLETS AT BEDTIME 180 tablet 2   SPIRIVA HANDIHALER 18 MCG inhalation capsule PLACE 1 CAPSULE INTO HANDIHALER AND INHALE THE CONTENTS DAILY 90 capsule 2   spironolactone (ALDACTONE) 25 MG tablet TAKE 1 TABLET (25 MG TOTAL) BY MOUTH DAILY. 90 tablet 3   TRUE METRIX BLOOD GLUCOSE TEST test strip TEST BLOOD SUGAR  UP  TO FOUR TIMES DAILY AS DIRECTED 400 strip 3   TRUEplus Lancets 33G MISC TEST BLOOD SUGAR UP TO FOUR TIMES DAILY AS DIRECTED 400 each 3   VASCEPA 1 g capsule TAKE 2 CAPSULES TWICE DAILY 360 capsule 0   vitamin B-12 (CYANOCOBALAMIN) 1000 MCG tablet Take 1,000 mcg by mouth daily with breakfast.     vitamin E 400 UNIT capsule Take 400 Units by mouth at bedtime.     No current facility-administered medications for this visit.    Allergies-reviewed and updated Allergies  Allergen Reactions   Codeine Sulfate Nausea Only   Morphine Sulfate Nausea Only    Social History   Social History Narrative   Lives with her son but he works all day. Home for dinner.  Independent  ADL. Needs assist on some IADL. Right handed    Objective  Objective:  BP 110/60   Pulse 78   Temp 98.9 F (37.2 C)   Ht '5\' 5"'  (1.651 m)   Wt 187 lb (84.8 kg)   SpO2 98%   BMI 31.12 kg/m  Gen: NAD, resting comfortably HEENT: Mucous membranes are moist. Oropharynx normal CV: RRR no murmurs rubs or gallops Lungs: CTAB no crackles, wheeze, rhonchi Abdomen: soft/nontender/nondistended/normal bowel sounds. No rebound or guarding.  Ext: no edema Skin: warm, dry Neuro: grossly normal, moves all extremities, PERRLA   Assessment and Plan   88 y.o. female presenting for annual physical.  Health Maintenance counseling: 1. Anticipatory guidance: Patient counseled regarding regular dental exams - does not have teeth- is a barrier for chewing, eye exams - yearly and scheduled already next year,  avoiding smoking and second hand smoke , limiting alcohol to 1 beverage per day , no illicit drugs .   2. Risk factor reduction:  Advised patient of need for regular exercise and diet rich and fruits and vegetables to reduce risk of heart attack and stroke.  Exercise- limited by pain in knees- still could encourage chair exercises- PT/OT coming out weekly-encouraged her to do these exercises.  Diet/weight management-thankfully stable with heart failure.  Wt Readings from Last 3 Encounters:  03/22/22 187 lb (84.8 kg)  02/22/22 186 lb 9.6 oz (84.6 kg)  02/07/22 188 lb 12.8 oz (85.6 kg)  3. Immunizations/screenings/ancillary studies - October high dose flu shot, consider covid around that time, RSV would be recommended as well - shingrix potentially another time once more up to date, also delay prevnar 20 for now - reasses in [employer] History  Administered Date(s) Administered   Fluad Quad(high Dose 65+) 08/17/2020   Influenza Split 04/21/2011, 05/16/2012   Influenza Whole 05/24/2007, 05/01/2009, 04/17/2010   Influenza, High Dose Seasonal PF 03/23/2018, 05/25/2019, 05/01/2021    Influenza,inj,Quad PF,6+ Mos 04/14/2013, 04/10/2014, 04/03/2015   Influenza-Unspecified 06/01/2016, 06/15/2017, 05/24/2020   Moderna Sars-Covid-2 Vaccination 10/25/2019, 11/22/2019, 10/03/2020, 08/09/2021   Pneumococcal Conjugate-13 09/22/2014, 05/25/2019   Pneumococcal Polysaccharide-23 04/18/2003   Tdap 07/29/2011   Zoster, Live 04/18/2011  4. Cervical cancer screening- past age based screening and no discharge or blood noted 5. Breast cancer screening-  past age based screening recs 6. Colon cancer screening - past age based screening recs 7. Skin cancer screening- has been a few years since seeing derm. advised regular sunscreen use. Denies worrisome, changing, or new skin lesions- other than area on back 8. Birth control/STD check- not dating/sexually active 9. Osteoporosis screening at 65- bone density opts out-  primarily supported by sons with walking 10. Smoking associated screening - former smoker- quit 2008- no regular screening needed. UA no blood on 03/02/22 in ED  Status of chronic or acute concerns   #Palliative approach-working with palliative in [place]-not yet on hospice.  Abdominal mass potentially related to cancer but does not want surgery-has noted decreasing strength in 2023 later stabilized with PT/OT.  no worsening abdominal pain thankfully   #altered mental status- did have ED visit on 03/02/22- essentially cleared by time of full eval- no clear underlying cause was found.  #areas of concern on back- she thinks has been there sometime but showed son a few days ago- he saw a pimple like area with discharge that later had a covering over it- I took bandaid off today and this all came off with the scab- underlying 3 x 3 cm wound area- they will monitor but if fails to slowly improve- may need dermatology visit or reassessment here. Family has noted other areas on back that may have been similar in past- these appear to be SK- possibly had an irritated SK- will  monitor  #Seizure history-follows with Dr. Delice Lesch on Lamictal 25 mg twice daily   #dementia- also noted by Dr. Delice Lesch- thought vascular  #CAD  #hyperlipidemia S: Medication:Aspirin 81 mg, Plavix 75 mg, fenofibrate 134 mg, Vascepa 1 g, rosuvastatin 40 mg Lab Results  Component Value Date   CHOL 133 07/08/2021   HDL 44.30 07/08/2021   LDLCALC 42 02/18/2020   LDLDIRECT 44.0 07/08/2021   TRIG (H) 07/08/2021    430.0 Triglyceride is over 400; calculations on Lipids are invalid.   CHOLHDL 3 07/08/2021  A/P: CAD largely asymptomatic but sedentary- continue current meds For lipids- hold off as not fasting and triglycerides high   #CHF  #hypertension-works with resistant hypertension clinic S: medication: Furosemide 20 mg, hydralazine 50 mg 3 times a day amlodipine 5 mg carvedilol 12.5 mg twice daily, enalapril 20 mg twice daily, spironolactone 25 mg daily - did have episode of Bp running low at home- family did great job and held a dose of hydralazine- and has done that a few times Home readings #s: usually 120s or 130s but can run into 140s and as low as 100s or lower Edema-none, weight- stable, shortness of breath- stable  A/P: Blood pressure seems in general to be overcontrolled-we are going to reduce amlodipine to 2.5 mg daily and take an additional tablet if blood pressure gets above 145/90 during the day.  Family has done a great job adjusting with lows of holding afternoon hydralazine-I also  considered reducing hydralazine but will hold off for now and see how she does with this reduction first.  # Diabetes S: Medication:Metformin 500 mg extended release twice daily, Lantus 18 units each morning Lab Results  Component Value Date   HGBA1C 6.8 (H) 10/14/2021  A/P: hopefully stable- update a1c today. Continue current meds for now  # COPD  S: Patient reports stable shortness of breath and cough - slight increased cough at night- corricidin HBP helpful Maintenance medications: Advair  250-50 mcg 1 puff twice daily, spiriva  Patient has had to use albuterol 2-3 x per day. no oxygen at home  A/P: copd overall stable- continue to monitor  #GERD-  on pantoprazole 40 mg daily- working well   Recommended follow up: Return in about 4 months (around 07/22/2022) for followup or sooner if needed.Schedule b4 you leave. Future Appointments  Date Time Provider [place]  03/30/2022  3:10 PM Loel Dubonnet, NP [facility]  04/25/2022  3:45 PM [facility] CCM PHARMACIST [facility]  06/20/2022  2:30 PM [facility] HEALTH COACH [facility]  08/08/2022  1:00 PM Bernarda Caffey, MD [facility] None    Lab/Order associations:NOT fasting   ICD-10-CM   1. Preventative health care  Z00.00     2. Type 2 diabetes mellitus without complication, with long-term current use of insulin (HCC)  E11.9    Z79.4     3. Coronary artery disease involving native coronary artery of native heart without angina pectoris  I25.10     4. Chronic diastolic CHF (congestive heart failure) (HCC)  I50.32     5. Vascular dementia, unspecified dementia severity, unspecified whether behavioral, psychotic, or mood disturbance or anxiety ([place])  F01.50     6. Nonintractable epilepsy without status epilepticus, unspecified epilepsy type ([place])  G40.909     7. Chronic obstructive pulmonary disease, unspecified COPD type ([place]) Chronic J44.9       No orders of the defined types were placed in this encounter.   Return precautions advised.  [name], MD

## 2022-03-22 NOTE — Patient Instructions (Addendum)
Blood pressure seems in general to be overcontrolled-we are going to reduce amlodipine to 2.5 mg daily and take an additional tablet if blood pressure gets above 145/90 during the day.  Family has done a great job adjusting with lows of holding afternoon hydralazine-I also considered reducing hydralazine but will hold off for now and see how she does with this reduction first.  Please stop by lab before you go If you have mychart- we will send your results within 3 business days of [place] receiving them.  If you do not have mychart- we will call you about results within 5 business days of [place] receiving them.  *please also note that you will see labs on mychart as soon as they post. I will later go in and write notes on them- will say "notes from Dr. Yong Channel"   Recommended follow up: Return in about 4 months (around 07/22/2022) for followup or sooner if needed.Schedule b4 you leave.

## 2022-03-23 ENCOUNTER — Telehealth: Payer: Self-pay | Admitting: Family Medicine

## 2022-03-23 LAB — CBC WITH DIFFERENTIAL/PLATELET
Basophils Absolute: 0 10*3/uL (ref 0.0–0.1)
Basophils Relative: 0.6 % (ref 0.0–3.0)
Eosinophils Absolute: 0.1 10*3/uL (ref 0.0–0.7)
Eosinophils Relative: 0.9 % (ref 0.0–5.0)
HCT: 35.9 % — ABNORMAL LOW (ref 36.0–46.0)
Hemoglobin: 11.9 g/dL — ABNORMAL LOW (ref 12.0–15.0)
Lymphocytes Relative: 27.3 % (ref 12.0–46.0)
Lymphs Abs: 2.3 10*3/uL (ref 0.7–4.0)
MCHC: 33.2 g/dL (ref 30.0–36.0)
MCV: 87.8 fl (ref 78.0–100.0)
Monocytes Absolute: 0.7 10*3/uL (ref 0.1–1.0)
Monocytes Relative: 8.4 % (ref 3.0–12.0)
Neutro Abs: 5.2 10*3/uL (ref 1.4–7.7)
Neutrophils Relative %: 62.8 % (ref 43.0–77.0)
Platelets: 252 10*3/uL (ref 150.0–400.0)
RBC: 4.09 Mil/uL (ref 3.87–5.11)
RDW: 14.4 % (ref 11.5–15.5)
WBC: 8.3 10*3/uL (ref 4.0–10.5)

## 2022-03-23 LAB — COMPREHENSIVE METABOLIC PANEL
ALT: 11 U/L (ref 0–35)
AST: 15 U/L (ref 0–37)
Albumin: 4 g/dL (ref 3.5–5.2)
Alkaline Phosphatase: 33 U/L — ABNORMAL LOW (ref 39–117)
BUN: 43 mg/dL — ABNORMAL HIGH (ref 6–23)
CO2: 22 mEq/L (ref 19–32)
Calcium: 9.7 mg/dL (ref 8.4–10.5)
Chloride: 101 mEq/L (ref 96–112)
Creatinine, Ser: 1.18 mg/dL (ref 0.40–1.20)
GFR: 41.61 mL/min — ABNORMAL LOW (ref >60.00)
Glucose, Bld: 88 mg/dL (ref 70–99)
Potassium: 4.6 mEq/L (ref 3.5–5.1)
Sodium: 132 mEq/L — ABNORMAL LOW (ref 135–145)
Total Bilirubin: 0.3 mg/dL (ref 0.2–1.2)
Total Protein: 7.3 g/dL (ref 6.0–8.3)

## 2022-03-23 LAB — HEMOGLOBIN A1C: Hgb A1c MFr Bld: 6.6 % — ABNORMAL HIGH (ref 4.6–6.5)

## 2022-03-23 NOTE — Telephone Encounter (Signed)
Will a referral need to be placed to pulmonary for this?

## 2022-03-23 NOTE — Telephone Encounter (Signed)
I would be honest with family that we have had similar issues even with [facility] as far as back order but 1 year does seem rather long-you can offer to place a referral to pulmonary but please let them know this may simply be a logistical issue-I am not sure how to get supplies otherwise

## 2022-03-23 NOTE — Telephone Encounter (Signed)
Caller states: -Patient was found to have sleep apnea after sleep study at [facility] sleep [facility] center was completed 11/29/20 - Patient was meant to receive a CPAP machine but this was on back order about 4 months out at the time  - Caller has been checking on CPAP status periodically but has continued to be told that it is on back order. It has been over a year.   Caller requests: - Advice on how to get CPAP for patient since home health nurse agrees this is needed

## 2022-03-24 NOTE — Telephone Encounter (Signed)
I have reached out to cardiology in regard.  They have given me [employer]'s phone number at [phone] to follow up as to where patient is in line.    I will reach out to company tomorrow.

## 2022-03-24 NOTE — Telephone Encounter (Signed)
Called and lm on pt son vm with below message and encouraged cb if wants pulmonary referral.

## 2022-03-25 NOTE — Telephone Encounter (Signed)
I have spoken with son in regard.  I have reached out to [employer] [facility].  They do not have patient on a wait list.  States they tried to reach patient 3 times last September.  They can only speak with patient.   Patient can call [employer] [facility] back at [phone] to get order started.  She will have to inform the company that she did see her cardiologist last month (ordering provider).  Son is going to advise patient.

## 2022-03-28 ENCOUNTER — Telehealth: Payer: Self-pay | Admitting: Family Medicine

## 2022-03-28 NOTE — Telephone Encounter (Signed)
See below

## 2022-03-28 NOTE — Telephone Encounter (Signed)
Tried to reach out to pt son at number below and phone just rang, never clicked over to vm. If pt calls back please give below message.

## 2022-03-28 NOTE — Telephone Encounter (Signed)
Mychart message has been sent also.

## 2022-03-28 NOTE — Telephone Encounter (Signed)
With the fact that the knee gave out and pain is worse-would be reasonable to get sports medicine evaluation with Dr. Georgina Snell, Dr. Tamala Julian, or Dr. Glennon Mac if she is agreeable-May need x-ray and/or ultrasound

## 2022-03-28 NOTE — Telephone Encounter (Signed)
Caller states: - Patient fell while getting out of bed last night. States her left knee "gave out" - Patient was evaluated by EMS and declines pain anywhere despite having a bruise on buttocks - She has been taking 2 arthritis strength tylenol  tablets three times daily but this is no longer providing relief for knee pain - Topical creams aren't helping  Caller requests: - A new recommendation from PCP that could provide patient with relief.   Please advise.

## 2022-03-30 ENCOUNTER — Encounter (HOSPITAL_BASED_OUTPATIENT_CLINIC_OR_DEPARTMENT_OTHER): Payer: Self-pay | Admitting: Family

## 2022-03-30 ENCOUNTER — Ambulatory Visit (INDEPENDENT_AMBULATORY_CARE_PROVIDER_SITE_OTHER): Payer: Medicare HMO | Admitting: Family

## 2022-03-30 VITALS — BP 123/68 | HR 80 | Ht 65.0 in

## 2022-03-30 DIAGNOSIS — R0989 Other specified symptoms and signs involving the circulatory and respiratory systems: Secondary | ICD-10-CM

## 2022-03-30 DIAGNOSIS — E785 Hyperlipidemia, unspecified: Secondary | ICD-10-CM

## 2022-03-30 DIAGNOSIS — I25118 Atherosclerotic heart disease of native coronary artery with other forms of angina pectoris: Secondary | ICD-10-CM

## 2022-03-30 DIAGNOSIS — I119 Hypertensive heart disease without heart failure: Secondary | ICD-10-CM | POA: Diagnosis not present

## 2022-03-30 DIAGNOSIS — I10 Essential (primary) hypertension: Secondary | ICD-10-CM

## 2022-03-30 MED ORDER — HYDRALAZINE HCL 50 MG PO TABS: ORAL_TABLET | ORAL | 3 refills | Status: DC

## 2022-03-30 MED ORDER — HYDRALAZINE HCL 25 MG PO TABS: ORAL_TABLET | ORAL | 3 refills | Status: DC

## 2022-03-30 NOTE — Progress Notes (Signed)
Virtual Visit via Telephone Note   Because of Michelle [name]'s co-morbid illnesses, she is at least at moderate risk for complications without adequate follow up.  This format is felt to be most appropriate for this patient at this time.  The patient did not have access to video technology/had technical difficulties with video requiring transitioning to audio format only (telephone).  All issues noted in this document were discussed and addressed.  No physical exam could be performed with this format.  Please refer to the patient's chart for her consent to telehealth for [facility].    Date:  03/30/2022   ID:  [name], DOB 06/10/1933, MRN [id number] The patient was identified using 2 identifiers.  Patient Location: Home Provider Location: Office/Clinic   PCP:  [name], MD   [facility] Providers Cardiologist:  Lauree Chandler, MD  / Dr. Oval Linsey [facility] Click to update primary MD,subspecialty MD or APP then REFRESH:1}    Evaluation Performed:  Follow-Up Visit  Chief Complaint:  BP follow up   History of Present Illness:    AMAZIN PINCOCK is a 88 y.o. female with CAD (angioplasty of RCA 1990, inferior STEMI 02/2014 RCA treated with 2 covered stents), DM 2, HTN, HLD, prior tobacco use, chronic diastolic heart failure.  Established with advanced hypertension clinic February 2022.  Admitted 08/2020 with confusion BP 186/81, hypertensive encephalopathy.  Amlodipine, enalapril, spironolactone continued.  Metoprolol increased and hydralazine added.  Furosemide reduced due to concern for dehydration.  Cortisol normal limits.  Renal artery Dopplers 07/2020 unremarkable.  Most recent phone visit 02/22/2022 with Dr. Oval Linsey.  BP fluctuating 118/62-155/77 noted some breathing issues with improvement with nebulizer and albuterol.  Knee pain was being treated by Voltaren gel.  She was noted to have large Between her medications and encouraged to take  routinely 12 hours apart.  She had significant hypertension in the past so more aggressive treatment was not pursued.  Presents today for follow up. Virtual visit assisted by her son [name].   Midday when she is scheduled for her 2 pm meds - has often had to skip Hydralazine 2pm dose. If blood pressure is systolic 664Q will skip dose. Has given 3 out of the last 7 days lunchtime dose. Lowest BP 98/49 in the afternoon. Highest BP 159/81. Has not described being lightheaded or dizzy. Using extra Amlodipine in the evening about 50% of the time. Some exertional dyspnea which improved with nebulizer. Still with occasional episodes of diarrhea.    Past Medical History:  Diagnosis Date   Anginal pain ([place])    Arthritis    AV block, 1st degree    CAD (coronary artery disease) 1990; 2015   Cardiac cath 1990 with Dr. Lia Foyer and [name] Residual CAD, non obstructive per 2015 cath. STEMI status post stent in August 2015.   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   Promus DES to RCA with STEMI   Shortness of breath    Past Surgical History:  Procedure Laterality Date   CATARACT EXTRACTION  2020   right and left eye    CHOLECYSTECTOMY     thinks her appendix was removed at the same time   [place]  02/18/14   Promus DES to RCA   [place] N/A 02/18/2014   Procedure: LEFT HEART CATHETERIZATION WITH CORONARY ANGIOGRAM;  Surgeon: Jettie Booze, MD;  Location: [facility] CATH  LAB;  Service: Cardiovascular;  Laterality: N/A;   PTCA  1990   PTCA of RCA   TRANSTHORACIC ECHOCARDIOGRAM  02/02/2012   mild LVH, EF 55-60%, Normal WM, Gr 1 DD; Mild MR     Current Meds  Medication Sig   acetaminophen (TYLENOL) 650 MG CR tablet Take 1,300 mg by mouth 3 (three) times daily. Patient take 3 times a day.   albuterol (PROVENTIL) (2.5 MG/3ML) 0.083% nebulizer solution USE THREE MILLILITERS VIA NEBULIZATION BY MOUTH EVERY 6 HOURS AS NEEDED FOR WHEEZING OR SHORTNESS OF BREATH   albuterol (VENTOLIN HFA) 108 (90 Base) MCG/ACT inhaler INHALE 1 PUFF EVERY 4 HOURS AS NEEDED FOR WHEEZING, SHORTNESS OF BREATH (RESCUE INHALER IF ADVAIR NOT WORKING)   Alcohol Swabs (DROPSAFE ALCOHOL PREP) 70 % PADS USE  FOR  TESTING THREE TIMES DAILY   amLODipine (NORVASC) 2.5 MG tablet Take 2.5 mg in the morning. If afternoon blood pressure >145/90 take second tablet of 2.5   aspirin 81 MG tablet Take 81 mg by mouth daily with breakfast.   Blood Glucose Calibration (ACCU-CHEK SMARTVIEW CONTROL) LIQD    blood glucose meter kit and supplies KIT Dispense based on patient and insurance preference. Use up to four times daily as directed. Dx:E11.9   calcium carbonate (OSCAL) 1500 (600 Ca) MG TABS tablet Take 600 mg of elemental calcium by mouth daily with breakfast.   carvedilol (COREG) 12.5 MG tablet TAKE 1 TABLET TWICE DAILY   cholecalciferol (VITAMIN D3) 25 MCG (1000 UNIT) tablet Take 1,000 Units by mouth daily with breakfast.   clopidogrel (PLAVIX) 75 MG tablet TAKE 1 TABLET EVERY DAY   diclofenac Sodium (VOLTAREN) 1 % GEL Apply 2 g topically 3 (three) times daily as needed (pain).   DROPLET PEN NEEDLES 31G X 8 MM MISC USE AS DIRECTED WITH LANTUS SOLOSTAR PEN   enalapril (VASOTEC) 20 MG tablet TAKE 1 TABLET TWICE DAILY   fenofibrate micronized (LOFIBRA) 134 MG capsule TAKE 1 CAPSULE EVERY DAY   ferrous sulfate 325 (65 FE) MG tablet Twice a week (Patient taking differently: Take 325 mg by mouth 2 (two) times a  week.)   fluticasone-salmeterol (ADVAIR) 250-50 MCG/ACT AEPB INHALE 1 PUFF TWICE DAILY   folic acid (FOLVITE) 798 MCG tablet Take 400 mcg by mouth daily with breakfast.   furosemide (LASIX) 20 MG tablet TAKE 1 TABLET EVERY DAY   hydrALAZINE (APRESOLINE) 50 MG tablet Take 1 tablet (50 mg total) by mouth 3 (three) times daily.   lamoTRIgine (LAMICTAL) 25 MG tablet Take 1 tablet twice a day   Lancets Misc. (ACCU-CHEK FASTCLIX LANCET) KIT 1 Device by Does not apply route. Use as directed for testing blood sugar   Lancets Misc. (ACCU-CHEK FASTCLIX LANCET) KIT Use as directed to test blood sugar   LANTUS SOLOSTAR 100 UNIT/ML Solostar Pen INJECT  17  TO  20 UNITS SUBCUTANEOUSLY EVERY MORNING   loratadine (CLARITIN) 10 MG tablet Take 10 mg by mouth every other day.   Melatonin 5 MG CAPS Take 5 mg by mouth at bedtime.  metFORMIN (GLUCOPHAGE-XR) 500 MG 24 hr tablet TAKE ONE TABLET BY MOUTH IN THE MORNING AND AT BEDTIME   nitroGLYCERIN (NITROSTAT) 0.4 MG SL tablet DISSOLVE 1 TAB UNDER TONGUE FOR CHEST PAIN - IF PAIN REMAINS AFTER 5 MIN, CALL 911 AND REPEAT DOSE. MAX 3 TABS IN 15 MINUTES   pantoprazole (PROTONIX) 40 MG tablet TAKE 1 TABLET EVERY DAY   rosuvastatin (CRESTOR) 40 MG tablet TAKE 1 TABLET EVERY DAY   sertraline (ZOLOFT) 50 MG tablet TAKE 2 TABLETS AT BEDTIME   SPIRIVA HANDIHALER 18 MCG inhalation capsule PLACE 1 CAPSULE INTO HANDIHALER AND INHALE THE CONTENTS DAILY   spironolactone (ALDACTONE) 25 MG tablet TAKE 1 TABLET (25 MG TOTAL) BY MOUTH DAILY.   TRUE METRIX BLOOD GLUCOSE TEST test strip TEST BLOOD SUGAR  UP  TO FOUR TIMES DAILY AS DIRECTED   TRUEplus Lancets 33G MISC TEST BLOOD SUGAR UP TO FOUR TIMES DAILY AS DIRECTED   VASCEPA 1 g capsule TAKE 2 CAPSULES TWICE DAILY   vitamin B-12 (CYANOCOBALAMIN) 1000 MCG tablet Take 1,000 mcg by mouth daily with breakfast.   vitamin E 400 UNIT capsule Take 400 Units by mouth at bedtime.     Allergies:   Codeine sulfate and Morphine sulfate    Social History   Tobacco Use   Smoking status: Former    Packs/day: 1.00    Years: 55.00    Total pack years: 55.00    Types: Cigarettes    Quit date: 09/29/2006    Years since quitting: 15.5   Smokeless tobacco: Never  Vaping Use   Vaping Use: Never used  Substance Use Topics   Alcohol use: No   Drug use: No     Family Hx: The patient's family history includes Bone cancer in her brother; Cancer in her maternal grandfather, paternal grandfather, and paternal grandmother; Cancer (age of onset: 11) in her father; Diabetes in her son and son; Heart attack in her son; Heart attack (age of onset: 83) in her mother; Hyperlipidemia in her son; Hypertension in her son; Liver cancer in her brother. There is no history of Colon cancer, Ovarian cancer, or Uterine cancer.  ROS:   Please see the history of present illness.     All other systems reviewed and are negative.   Prior CV studies:   The following studies were reviewed today:  Bilateral Renal Artery Doppler 07/29/2020: Summary:  Renal:  Right: Normal size right kidney. RRV flow present. No evidence of right renal artery stenosis.   Left:  Normal size of left kidney. LRV flow present. No evidence of left renal artery stenosis.     Echo 02/2014: Study Conclusions   - Left ventricle: The cavity size was normal. Systolic function was normal. The estimated ejection fraction was in the range of 60% to 65%. Wall motion was normal; there were no regional wall motion abnormalities.  - Aortic valve: There was mild to moderate regurgitation.  - Mitral valve: There was mild regurgitation.  - Left atrium: The atrium was mildly dilated.  - Pericardium, extracardiac: A trivial pericardial effusion was    identified posterior to the heart.    Labs/Other Tests and Data Reviewed:    EKG:  No ECG reviewed.  Recent Labs: 03/22/2022: ALT 11; BUN 43; Creatinine, Ser 1.18; Hemoglobin 11.9; Platelets 252.0; Potassium 4.6; Sodium 132    Recent Lipid Panel Lab Results  Component Value Date/Time   CHOL 133 07/08/2021 04:12 PM   TRIG (H) 07/08/2021 04:12 PM  430.0 Triglyceride is over 400; calculations on Lipids are invalid.   TRIG 1693 (HH) 07/28/2006 12:02 PM   HDL 44.30 07/08/2021 04:12 PM   CHOLHDL 3 07/08/2021 04:12 PM   LDLCALC 42 02/18/2020 03:29 PM   LDLDIRECT 44.0 07/08/2021 04:12 PM    Wt Readings from Last 3 Encounters:  03/22/22 187 lb (84.8 kg)  02/22/22 186 lb 9.6 oz (84.6 kg)  02/07/22 188 lb 12.8 oz (85.6 kg)        Objective:    Vital Signs:  BP 123/68   Pulse 80   Ht '5\' 5"'  (1.651 m)   BMI 31.12 kg/m    VITAL SIGNS:  reviewed  ASSESSMENT & PLAN:    CAD - Stable with no anginal symptoms. No indication for ischemic evaluation.  GDMT Vascepa, rosuvastatin, as needed nitroglycerin, aspirin, carvedilol.  HTN -now with hypotension often afternoon dosing of medications.  Adjust hydralazine to 50 mg a.m., 25 mg afternoon, 50 mg p.m.  Continue carvedilol 12.5 mg twice daily, Lasix 20 mg daily, enalapril 20 mg twice daily, amlodipine 2.5 mg daily.  Continue amlodipine 2.5 mg as needed for SBP greater than 145/90 in the evening.  HLD -continue rosuvastatin, Vascepa     Time:   Today, I have spent 12 minutes with the patient with telehealth technology discussing the above problems.     Medication Adjustments/Labs and Tests Ordered: Current medicines are reviewed at length with the patient today.  Concerns regarding medicines are outlined above.   Tests Ordered: No orders of the defined types were placed in this encounter.   Medication Changes: No orders of the defined types were placed in this encounter.   Follow Up:  Virtual Visit  in 1 month(s)  Signed, Loel Dubonnet, NP  03/30/2022 3:50 PM    [place]

## 2022-03-30 NOTE — Patient Instructions (Signed)
Medication Instructions:  Your physician has recommended you make the following change in your medication:   CHANGE Hydralazine to '50mg'$  in the morning, '25mg'$  in the afternoon, and '50mg'$  in the afternoon If your systolic blood pressure (the top number) is less than 110 - skip afternoon dose  *If you need a refill on your cardiac medications before your next appointment, please call your pharmacy*   Lab Work: None ordered today.   Testing/Procedures: None ordered today.   Follow-Up: At [facility], you and your health needs are our priority.  As part of our continuing mission to provide you with exceptional heart care, we have created designated Provider Care Teams.  These Care Teams include your primary Cardiologist (physician) and Advanced Practice Providers (APPs -  Physician Assistants and Nurse Practitioners) who all work together to provide you with the care you need, when you need it.  We recommend signing up for the patient portal called "MyChart".  Sign up information is provided on this After Visit Summary.  MyChart is used to connect with patients for Virtual Visits (Telemedicine).  Patients are able to view lab/test results, encounter notes, upcoming appointments, etc.  Non-urgent messages can be sent to your provider as well.   To learn more about what you can do with MyChart, go to [email or web address].    Your next appointment:   As scheduled  Other Instructions  Heart Healthy Diet Recommendations: A low-salt diet is recommended. Meats should be grilled, baked, or boiled. Avoid fried foods. Focus on lean protein sources like fish or chicken with vegetables and fruits. The American Heart Association is a Microbiologist!  American Heart Association Diet and Lifeystyle Recommendations   Tips to Measure your Blood Pressure Correctly  Here's what you can do to ensure a correct reading:  Don't drink a caffeinated beverage or smoke during the 30 minutes before the  test.  Sit quietly for five minutes before the test begins.  During the measurement, sit in a chair with your feet on the floor and your arm supported so your elbow is at about heart level.  The inflatable part of the cuff should completely cover at least 80% of your upper arm, and the cuff should be placed on bare skin, not over a shirt.  Don't talk during the measurement.  Blood pressure categories  Blood pressure category SYSTOLIC (upper number)  DIASTOLIC (lower number)  Normal Less than 120 mm Hg and Less than 80 mm Hg  Elevated 120-129 mm Hg and Less than 80 mm Hg  High blood pressure: Stage 1 hypertension 130-139 mm Hg or 80-89 mm Hg  High blood pressure: Stage 2 hypertension 140 mm Hg or higher or 90 mm Hg or higher  Hypertensive crisis (consult your doctor immediately) Higher than 180 mm Hg and/or Higher than 120 mm Hg  Source: American Heart Association and American Stroke Association. For more on getting your blood pressure under control, buy Controlling Your Blood Pressure, a Special Health Report from [facility].   Blood Pressure Log   Date   Time  Blood Pressure  Example: Nov 1 9 AM 124/78

## 2022-03-31 ENCOUNTER — Encounter (HOSPITAL_BASED_OUTPATIENT_CLINIC_OR_DEPARTMENT_OTHER): Payer: Self-pay | Admitting: Family

## 2022-04-04 ENCOUNTER — Ambulatory Visit: Payer: Medicare HMO | Admitting: Family Medicine

## 2022-04-04 ENCOUNTER — Ambulatory Visit: Payer: Self-pay

## 2022-04-04 ENCOUNTER — Ambulatory Visit (INDEPENDENT_AMBULATORY_CARE_PROVIDER_SITE_OTHER): Payer: Medicare HMO

## 2022-04-04 VITALS — BP 142/88 | HR 88 | Ht 65.0 in

## 2022-04-04 DIAGNOSIS — M170 Bilateral primary osteoarthritis of knee: Secondary | ICD-10-CM

## 2022-04-04 DIAGNOSIS — M25562 Pain in left knee: Secondary | ICD-10-CM

## 2022-04-04 DIAGNOSIS — G8929 Other chronic pain: Secondary | ICD-10-CM | POA: Diagnosis not present

## 2022-04-04 DIAGNOSIS — M25561 Pain in right knee: Secondary | ICD-10-CM | POA: Diagnosis not present

## 2022-04-04 MED ORDER — NORTRIPTYLINE HCL 10 MG PO CAPS: 10.0000 mg | ORAL_CAPSULE | Freq: Every day | ORAL | 1 refills | Status: DC

## 2022-04-04 NOTE — Progress Notes (Unsigned)
   I, Peterson Lombard, LAT, ATC acting as a scribe for [name], MD.  Subjective:    CC: Bilat knee pain  HPI: Pt is an 88 y/o female c/o bilat knee pain ongoing for many years. Pt's son notes pt has had several falls as of late. Pt locates pain to the anterior aspect of both knees.  She has had cortisone shots in the past which have not helped.  She does not think that she has ever had gel shots.  She currently is receiving home health physical therapy to work on gait training which she thinks is helpful.  Knee swelling: no Mechanical symptoms: yes Aggravates: walking, weight bearing Treatments tried: prior steroid, Voltaren gel, frankincense EO, Tylenol   Dx imaging: 07/13/14 R knee XR  Pertinent review of Systems: No fevers or chills  Relevant historical information: Hypertension.  Heart failure.  Epilepsy and dementia.   Objective:    Vitals:   04/04/22 1354  BP: (!) 142/88  Pulse: 88  SpO2: 96%   General: Elderly appearing woman in no acute distress seated in a wheelchair.  MSK: Knees bilaterally mild effusion normal-appearing otherwise.  Decreased range of motion.  Decreased quad bulk.  Lab and Radiology Results  X-ray images bilateral knees obtained today personally and independently interpreted  Right knee: Severe medial compartment DJD.  Mild to moderate patellofemoral DJD.  No acute fractures are present.  Left knee: Severe medial and moderate to severe patellofemoral DJD are present.  No acute fractures are present.  Await formal radiology review    Impression and Recommendations:    Assessment and Plan: 88 y.o. female with bilateral knee pain due to exacerbation of DJD.  She is failing conservative management.  Plan to work on authorization for both hyaluronic acid injections and Zilretta injections.  Continue home health physical therapy.  Return in the near future to start anticipated Zilretta injections.  Additionally we will try nortriptyline  at bedtime as this may help with some of the pain management needs.Marland Kitchen  PDMP not reviewed this encounter. Orders Placed This Encounter  Procedures   DG Knee AP/LAT W/Sunrise Left    Standing Status:   Future    Number of Occurrences:   1    Standing Expiration Date:   05/04/2022    Order Specific Question:   Reason for Exam (SYMPTOM  OR DIAGNOSIS REQUIRED)    Answer:   bilateral knee pain    Order Specific Question:   Preferred imaging location?    Answer:   [facility]   DG Knee AP/LAT W/Sunrise Right    Standing Status:   Future    Number of Occurrences:   1    Standing Expiration Date:   05/04/2022    Order Specific Question:   Reason for Exam (SYMPTOM  OR DIAGNOSIS REQUIRED)    Answer:   bilateral knee pain    Order Specific Question:   Preferred imaging location?    Answer:   [facility]   Meds ordered this encounter  Medications   nortriptyline (PAMELOR) 10 MG capsule    Sig: Take 1 capsule (10 mg total) by mouth at bedtime. Daily at bedtime for pain prevention    Dispense:  30 capsule    Refill:  1    Discussed warning signs or symptoms. Please see discharge instructions. Patient expresses understanding.   The above documentation has been reviewed and is accurate and complete [name], M.D.

## 2022-04-04 NOTE — Patient Instructions (Addendum)
Thank you for coming in today.   Please get an Xray today before you leave   We will get zilretta and gel shots authorized.   I will see you back for likely the Zilretta injections.   Continue home health PT.   Try nortryptline at bedtime for next daily pain control a bit.

## 2022-04-05 DIAGNOSIS — G8929 Other chronic pain: Secondary | ICD-10-CM | POA: Insufficient documentation

## 2022-04-06 NOTE — Progress Notes (Signed)
Right knee x-ray shows severe arthritis

## 2022-04-06 NOTE — Progress Notes (Signed)
Left knee x-ray shows severe arthritis

## 2022-04-07 ENCOUNTER — Telehealth: Payer: Self-pay | Admitting: Family Medicine

## 2022-04-07 NOTE — Telephone Encounter (Signed)
See below

## 2022-04-07 NOTE — Telephone Encounter (Signed)
Ideally I would like her range between 80-180. I think alerting me if above 220 or below 70 is reasonable though.

## 2022-04-07 NOTE — Telephone Encounter (Signed)
Michelle [name] with [facility] requests to be called at ph# [phone] (please leave detailed message on voice mail if Michelle [name] is unable to answer) to give Michelle [name] Patient's upper and lower parameters for fasting blood sugars.

## 2022-04-07 NOTE — Telephone Encounter (Signed)
Called and lm on Melissa vm with below information.

## 2022-04-11 ENCOUNTER — Telehealth: Payer: Self-pay | Admitting: Family Medicine

## 2022-04-11 NOTE — Telephone Encounter (Signed)
Called and spoke with Tillie Rung and VO provided.

## 2022-04-11 NOTE — Telephone Encounter (Signed)
Agency:  [facility]   Caller: Tillie Rung, PT   Requesting OT/ PT/ Skilled nursing/ Social Work/ Speech:  PT  Reason for Request: Patient recently had fall so PT wants to work on fall prevention more  Frequency:  Once a week x 4 weeks

## 2022-04-13 ENCOUNTER — Telehealth: Payer: Self-pay | Admitting: Family Medicine

## 2022-04-13 ENCOUNTER — Other Ambulatory Visit: Payer: Self-pay | Admitting: Family Medicine

## 2022-04-13 DIAGNOSIS — I2510 Atherosclerotic heart disease of native coronary artery without angina pectoris: Secondary | ICD-10-CM

## 2022-04-13 NOTE — Telephone Encounter (Signed)
   Is this a Certification or Plan of Care? y [facility] Agency: [facility] Order Number:   [id number] Has charge sheet been attached? y Where has form been placed:   Providers box Faxed to:

## 2022-04-14 ENCOUNTER — Ambulatory Visit: Payer: Medicare HMO | Admitting: Family Medicine

## 2022-04-14 ENCOUNTER — Ambulatory Visit: Payer: Self-pay

## 2022-04-14 VITALS — BP 140/86 | HR 86 | Ht 65.0 in

## 2022-04-14 DIAGNOSIS — M170 Bilateral primary osteoarthritis of knee: Secondary | ICD-10-CM

## 2022-04-14 DIAGNOSIS — G8929 Other chronic pain: Secondary | ICD-10-CM | POA: Diagnosis not present

## 2022-04-14 MED ORDER — TRIAMCINOLONE ACETONIDE 32 MG IX SRER
64.0000 mg | Freq: Once | INTRA_ARTICULAR | Status: AC
  Administered 2022-04-14: 64 mg via INTRA_ARTICULAR

## 2022-04-14 NOTE — Patient Instructions (Signed)
Thank you for coming in today.   You received an injection today. Seek immediate medical attention if the joint becomes red, extremely painful, or is oozing fluid.   Check back as needed 

## 2022-04-14 NOTE — Progress Notes (Signed)
I, Michelle [name], LAT, ATC acting as a scribe for Michelle Leader, MD.  Michelle [name] is a 88 y.o. female who presents to [place] at [facility] today for cont'd chronic bilat knee pain. Pt was last seen by Dr. Georgina Snell on 04/04/22 and was advised to cont home health PT and we would work on authorizing gel shots and Zilretta. Today, pt reports both knees are very painful and she is not able to walk very far. Pt and her son are wanting to discuss the options and differences between the gel shots vs Zilretta.  Dx imaging: 04/04/22 R & L knee XR  07/13/14 R knee XR  Pertinent review of systems: No fevers or chills  Relevant historical information: Hypertension, heart failure, epilepsy, and dementia.   Exam:  BP (!) 140/86   Pulse 86   Ht '5\' 5"'$  (1.651 m)   SpO2 99%   BMI 31.12 kg/m  General: Frail elderly appearing woman seated in wheelchair.  No acute distress. MSK: Bilateral knees mild effusion decreased quad bulk.  Decreased range of motion.  Nontender to palpation.    Lab and Radiology Results  Zilretta injection bilateral knees Procedure: Real-time Ultrasound Guided Injection of right knee superior lateral patellar space Device: Philips Affiniti 50G Images permanently stored and available for review in PACS Verbal informed consent obtained.  Discussed risks and benefits of procedure. Warned about infection, bleeding, hyperglycemia damage to structures among others. Patient expresses understanding and agreement Time-out conducted.   Noted no overlying erythema, induration, or other signs of local infection.   Skin prepped in a sterile fashion.   Local anesthesia: Topical Ethyl chloride.   With sterile technique and under real time ultrasound guidance: Zilretta 32 mg injected into knee joint. Fluid seen entering the joint capsule.   Completed without difficulty   Advised to call if fevers/chills, erythema, induration, drainage, or persistent bleeding.   Images  permanently stored and available for review in the ultrasound unit.  Impression: Technically successful ultrasound guided injection.    Procedure: Real-time Ultrasound Guided Injection of left knee superior lateral patellar space Device: Philips Affiniti 50G Images permanently stored and available for review in PACS Verbal informed consent obtained.  Discussed risks and benefits of procedure. Warned about infection, bleeding, hyperglycemia damage to structures among others. Patient expresses understanding and agreement Time-out conducted.   Noted no overlying erythema, induration, or other signs of local infection.   Skin prepped in a sterile fashion.   Local anesthesia: Topical Ethyl chloride.   With sterile technique and under real time ultrasound guidance: Zilretta 32 mg injected into knee joint. Fluid seen entering the joint capsule.   Completed without difficulty   Advised to call if fevers/chills, erythema, induration, drainage, or persistent bleeding.   Images permanently stored and available for review in the ultrasound unit.  Impression: Technically successful ultrasound guided injection.    Lot number: [id number] for both injections    Assessment and Plan: 88 y.o. female with bilateral knee pain due to exacerbation of DJD.  She has severe DJD based on prior x-ray.  Plan for Zilretta injection today.  We have also authorized hyaluronic acid injections as well.  If this right injection does not work or last long enough we can proceed to the gel shots.  She is not a good candidate for total knee replacement based on her overall health.  Check back as needed.   PDMP not reviewed this encounter. Orders Placed This Encounter  Procedures  [place] LIMITED JOINT SPACE STRUCTURES LOW BILAT(NO LINKED CHARGES)    Order Specific Question:   Reason for Exam (SYMPTOM  OR DIAGNOSIS REQUIRED)    Answer:   bilateral knee pain    Order Specific Question:   Preferred imaging location?    Answer:    [place]   Meds ordered this encounter  Medications   Triamcinolone Acetonide (ZILRETTA) intra-articular injection 64 mg     Discussed warning signs or symptoms. Please see discharge instructions. Patient expresses understanding.   The above documentation has been reviewed and is accurate and complete Michelle [name], M.D.

## 2022-04-14 NOTE — Telephone Encounter (Signed)
Noted  

## 2022-04-21 NOTE — Progress Notes (Unsigned)
Chronic Care Management Pharmacy Note  04/21/2022 Name:  Michelle [name] MRN:  [id number] DOB:  1932-11-19  Summary: PharmD Fu - spoke with son [name].  Decreased metformin not much of a noticeable effect on diarrhea.  She has had some higher glucose readings per his report.  A few > 200 after eating.  No hypoglycemia.  Did not go over full log so not sure the extent of here elevations.  Was open to increasing back to twice daily if needed - see you Thursday.  Recommendations/Changes made from today's visit: Review glucose log - increase metformin if needed.  Plan: FU 6 month or sooner if needed   Subjective: Michelle [name] is an 88 y.o. year old female who is a primary patient of Hunter, Brayton Mars, MD.  The CCM team was consulted for assistance with disease management and care coordination needs.    Engaged with patient by telephone for follow up visit in response to provider referral for pharmacy case management and/or care coordination services.   Consent to Services:  The patient was given the following information about Chronic Care Management services today, agreed to services, and gave verbal consent: 1. CCM service includes personalized support from designated clinical staff supervised by the primary care provider, including individualized plan of care and coordination with other care providers 2. 24/7 contact phone numbers for assistance for urgent and routine care needs. 3. Service will only be billed when office clinical staff spend 20 minutes or more in a month to coordinate care. 4. Only one practitioner may furnish and bill the service in a calendar month. 5.The patient may stop CCM services at any time (effective at the end of the month) by phone call to the office staff. 6. The patient will be responsible for cost sharing (co-pay) of up to 20% of the service fee (after annual deductible is met). Patient agreed to services and consent obtained.  Patient Care Team: [name], MD as PCP - General (Family Medicine) Burnell Blanks, MD as PCP - Cardiology (Cardiology) Karin Golden, MD as Consulting Physician (Dermatology) Bernarda Caffey, MD as Consulting Physician (Ophthalmology) [name], MD as Consulting Physician (Neurology) Elease Hashimoto (Neurology) Edythe Clarity, [facility] (Pharmacist) Skeet Latch, MD as Attending Physician (Cardiology)  Recent office visits:  01/14/2021 OV (PCP) [name], MD; Diabetes has been well controlled. With loose stools we are going to hold metformin for 1 week then restart with metformin extended release 500 mg twice daily down from 1000 mg twice daily of instant release.   01/07/2021 [name], MD; acute visit for chronic cough, recommended Zyrtec nightly and trial of plain Mucinex twice daily, follow up 3-4 weeks with PCP.   Recent consult visits:  02/02/2021 OV (neurology) [name], MD; increase Lamotrigine 25 mg twice daily, keep a seizure calendar, follow up in 6 months.   11/17/2020 OV (gynecology) [name], MD; postmenopausal bleeding, incidental finding o a large left complex ovarian cyst 9.9 x 9.8 x 8.3 cm with septations and one solid nodule along the wall with vascularity in an 88 yo with 2 Mis, COPD, cHTN, Hypercholesterolemia, DM. Refer to Gyneco-Oncology. 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   Hospital visits:  None in previous 6 months     Objective:  Lab Results  Component Value Date   CREATININE 1.18 03/22/2022   BUN 43 (H) 03/22/2022   GFR 41.61 (L) 03/22/2022   GFRNONAA 59 (L) 03/02/2022  GFRAA 77 07/01/2020   NA 132 (L) 03/22/2022   K 4.6 03/22/2022   CALCIUM 9.7 03/22/2022   CO2 22 03/22/2022   GLUCOSE 88 03/22/2022    Lab Results  Component Value Date/Time   HGBA1C 6.6 (H) 03/22/2022 03:15 PM   HGBA1C 6.8 (H) 10/14/2021 04:34 PM   GFR 41.61 (L) 03/22/2022 03:15 PM   GFR 43.97 (L) 10/14/2021 04:34  PM   MICROALBUR 0.7 01/07/2014 09:43 AM   MICROALBUR 0.2 07/08/2013 03:12 PM    Last diabetic Eye exam:  Lab Results  Component Value Date/Time   HMDIABEYEEXA No Retinopathy 08/10/2021 12:00 AM    Last diabetic Foot exam:  Lab Results  Component Value Date/Time   HMDIABFOOTEX done 01/08/2014 12:00 AM     Lab Results  Component Value Date   CHOL 133 07/08/2021   HDL 44.30 07/08/2021   LDLCALC 42 02/18/2020   LDLDIRECT 44.0 07/08/2021   TRIG (H) 07/08/2021    430.0 Triglyceride is over 400; calculations on Lipids are invalid.   CHOLHDL 3 07/08/2021       Latest Ref Rng & Units 03/22/2022    3:15 PM 03/02/2022   10:52 AM 10/14/2021    4:34 PM  Hepatic Function  Total Protein 6.0 - 8.3 g/dL 7.3  7.3  7.1   Albumin 3.5 - 5.2 g/dL 4.0  3.9  4.3   AST 0 - 37 U/L '15  22  18   ' ALT 0 - 35 U/L '11  19  16   ' Alk Phosphatase 39 - 117 U/L 33  39  38   Total Bilirubin 0.2 - 1.2 mg/dL 0.3  0.5  0.3     Lab Results  Component Value Date/Time   TSH 0.820 08/12/2020 06:33 PM   TSH 1.680 06/24/2020 10:15 PM   TSH 0.936 03/05/2014 02:45 PM   TSH 1.20 10/09/2009 08:43 AM       Latest Ref Rng & Units 03/22/2022    3:15 PM 03/02/2022   10:52 AM 07/08/2021    4:12 PM  CBC  WBC 4.0 - 10.5 K/uL 8.3  7.9  8.4   Hemoglobin 12.0 - 15.0 g/dL 11.9  13.0  12.9   Hematocrit 36.0 - 46.0 % 35.9  39.7  38.2   Platelets 150.0 - 400.0 K/uL 252.0  303  229.0     No results found for: "VD25OH"  Clinical ASCVD: No  The ASCVD Risk score (Arnett DK, et al., 2019) failed to calculate for the following reasons:   The 2019 ASCVD risk score is only valid for ages 85 to 54       06/07/2021    2:46 PM 03/12/2021    4:16 PM 01/14/2021    2:22 PM  Depression screen PHQ 2/9  Decreased Interest 0 0 0  Down, Depressed, Hopeless 0 0 0  PHQ - 2 Score 0 0 0     Social History   Tobacco Use  Smoking Status Former   Packs/day: 1.00   Years: 55.00   Total pack years: 55.00   Types: Cigarettes   Quit  date: 09/29/2006   Years since quitting: 15.5  Smokeless Tobacco Never   BP Readings from Last 3 Encounters:  04/14/22 (!) 140/86  04/04/22 (!) 142/88  03/30/22 123/68   Pulse Readings from Last 3 Encounters:  04/14/22 86  04/04/22 88  03/30/22 80   Wt Readings from Last 3 Encounters:  03/22/22 187 lb (84.8 kg)  02/22/22 186 lb 9.6 oz (  84.6 kg)  02/07/22 188 lb 12.8 oz (85.6 kg)   BMI Readings from Last 3 Encounters:  04/14/22 31.12 kg/m  04/04/22 31.12 kg/m  03/30/22 31.12 kg/m    Assessment/Interventions: Review of patient past medical history, allergies, medications, health status, including review of consultants reports, laboratory and other test data, was performed as part of comprehensive evaluation and provision of chronic care management services.   SDOH:  (Social Determinants of Health) assessments and interventions performed: Yes SDOH Interventions    Flowsheet Row Chronic Care Management from 05/13/2020 in [place] Visit from 07/23/2019 in [place] Visit from 11/17/2017 in [place] Interventions Other (Comment) -- --  Transportation Interventions Other (Comment) -- --  Depression Interventions/Treatment  -- Medication Medication      Financial Resource Strain: Low Risk  (06/07/2021)   Overall Financial Resource Strain (CARDIA)    Difficulty of Paying Living Expenses: Not hard at all    [place]: No Food Insecurity (06/07/2021)  Housing: Low Risk  (06/07/2021)  Transportation Needs: No Transportation Needs (06/07/2021)  Depression (PHQ2-9): Low Risk  (06/07/2021)  Financial Resource Strain: Low Risk  (06/07/2021)  Physical Activity: Inactive (06/07/2021)  Social Connections: Socially Isolated (06/07/2021)  Stress: No Stress Concern Present (06/07/2021)  Tobacco Use: Medium Risk (03/31/2022)    [place]  Allergies  Allergen Reactions   Codeine Sulfate Nausea Only   Morphine Sulfate Nausea Only    Medications Reviewed Today     Reviewed by Mare Ferrari (Technician) on 04/04/22 at 37  Med List Status: <None>   Medication Order Taking? Sig Documenting Provider Last Dose Status Informant  acetaminophen (TYLENOL) 650 MG CR tablet [id number] No Take 1,300 mg by mouth 3 (three) times daily. Patient take 3 times a day. [provider] Taking Active Family Member  albuterol (PROVENTIL) (2.5 MG/3ML) 0.083% nebulizer solution [id number] No USE THREE MILLILITERS VIA NEBULIZATION BY MOUTH EVERY 6 HOURS AS NEEDED FOR WHEEZING OR SHORTNESS OF BREATH [name], MD Taking Active   albuterol (VENTOLIN HFA) 108 (90 Base) MCG/ACT inhaler [id number] No INHALE 1 PUFF EVERY 4 HOURS AS NEEDED FOR WHEEZING, SHORTNESS OF BREATH (RESCUE INHALER IF ADVAIR NOT WORKING) [name], MD Taking Active   Alcohol Swabs (DROPSAFE ALCOHOL PREP) 70 % PADS [id number] No USE  FOR  TESTING THREE TIMES DAILY [name], MD Taking Active   amLODipine (NORVASC) 2.5 MG tablet [id number] No Take 2.5 mg in the morning. If afternoon blood pressure >145/90 take second tablet of 2.5 [name], MD Taking Active   aspirin 81 MG tablet [id number] No Take 81 mg by mouth daily with breakfast. [provider] Taking Active Family Member           Med Note Alfonse Spruce, ANH T   Thu Aug 13, 2020  9:48 AM)    Blood Glucose Calibration (ACCU-CHEK SMARTVIEW CONTROL) Yehuda Budd [id number] No  [provider] Taking Active Family Member  blood glucose meter kit and supplies KIT [id number] No Dispense based on patient and insurance preference. Use up to four times daily as directed. Dx:E11.9 [name], MD Taking Active   calcium carbonate (OSCAL) 1500 (600 Ca) MG TABS tablet [id number] No Take 600 mg of elemental calcium by mouth daily with breakfast. [provider] Taking Active  Family Member  carvedilol (COREG) 12.5 MG tablet [id number] No TAKE  1 TABLET TWICE DAILY Skeet Latch, MD Taking Active   cholecalciferol (VITAMIN D3) 25 MCG (1000 UNIT) tablet [id number] No Take 1,000 Units by mouth daily with breakfast. [provider] Taking Active Family Member  clopidogrel (PLAVIX) 75 MG tablet [id number] No TAKE 1 TABLET EVERY DAY [name], MD Taking Active   diclofenac Sodium (VOLTAREN) 1 % GEL [id number] No Apply 2 g topically 3 (three) times daily as needed (pain). Lianne Cure, DO Taking Active   DROPLET PEN NEEDLES 31G X 8 MM MISC [id number] No USE AS DIRECTED WITH LANTUS SOLOSTAR PEN [name], MD Taking Active   enalapril (VASOTEC) 20 MG tablet [id number] No TAKE 1 TABLET TWICE DAILY [name], MD Taking Active   fenofibrate micronized (LOFIBRA) 134 MG capsule [id number] No TAKE 1 CAPSULE EVERY DAY [name], MD Taking Active   ferrous sulfate 325 (65 FE) MG tablet [id number] No Twice a week  Patient taking differently: Take 325 mg by mouth 2 (two) times a week.   [name], MD Taking Active   fluticasone-salmeterol (ADVAIR) 250-50 MCG/ACT AEPB [id number] No INHALE 1 PUFF TWICE DAILY [name], MD Taking Active   folic acid (FOLVITE) 086 MCG tablet [id number] No Take 400 mcg by mouth daily with breakfast. [provider] Taking Active Family Member  furosemide (LASIX) 20 MG tablet [id number] No TAKE 1 TABLET EVERY DAY Skeet Latch, MD Taking Active   hydrALAZINE (APRESOLINE) 25 MG tablet [id number]  Take one 25m tablet daily in the afternoon. Take one 571mtablet in the morning and at bedtime. WaLoel DubonnetNP  Active   hydrALAZINE (APRESOLINE) 50 MG tablet [id number] one 2574mablet daily in the afternoon. Take one 42m17mblet in the morning and at bedtime. WalkLoel Dubonnet  Active   lamoTRIgine (LAMICTAL) 25 MG tablet [id number] 1 tablet twice a day AquiCameron Sprang Taking  Active   Lancets Misc. (ACCU-CHEK FASTCLIX LANCET) KIT [id number] Device by Does not apply route. Use as directed for testing blood sugar [provider] Taking Active Family Member  Lancets Misc. (ACCU-CHEK FASTCLIX LANCET) KIT [id number] as directed to test blood sugar HuntMarin Olp Taking Active Family Member  LANTUS SOLOSTAR 100 UNIT/ML Solostar Pen [id number]  17  TO  20 UNITS SUBCUTANEOUSLY EVERY MORNING HuntMarin Olp Taking Active   loratadine (CLARITIN) 10 MG tablet [id number] 10 mg by mouth every other day. [provider] Taking Active Family Member  Melatonin 5 MG CAPS [id number] 5 mg by mouth at bedtime. [provider] Taking Active Family Member  metFORMIN (GLUCOPHAGE-XR) 500 MG 24 hr tablet [id number] ONE TABLET BY MOUTH IN THE MORNING AND AT BEDTIME HuntMarin Olp Taking Active   nitroGLYCERIN (NITROSTAT) 0.4 MG SL tablet [id number] 1 TAB UNDER TONGUE FOR CHEST PAIN - IF PAIN REMAINS AFTER 5 MIN, CALL 911 AND REPEAT DOSE. MAX 3 TABS IN 15 MINUTES RandOval LinseyffJonelle Sidle Taking Active   pantoprazole (PROTONIX) 40 MG tablet [id number] 1 TABLET EVERY DAY HuntMarin Olp Taking Active   rosuvastatin (CRESTOR) 40 MG tablet [id number] 1 TABLET EVERY DAY HuntMarin Olp Taking Active   sertraline (ZOLOFT) 50 MG tablet [id number] 2 TABLETS AT BEDTIME HuntMarin Olp Taking Active   SPIRLogansport State HospitalDIHALER 18 MCG inhalation capsule [id number]  1 CAPSULE INTO HANDIHALER AND INHALE THE CONTENTS DAILY [name], MD Taking Active   spironolactone (ALDACTONE) 25 MG tablet [id number] No TAKE 1 TABLET (25 MG TOTAL) BY MOUTH DAILY. [name], MD Taking Active   TRUE METRIX BLOOD GLUCOSE TEST test strip [id number] No TEST BLOOD SUGAR  UP  TO FOUR TIMES DAILY AS DIRECTED [name], MD Taking Active   TRUEplus Lancets 33G [place] [id number] No TEST  BLOOD SUGAR UP TO FOUR TIMES DAILY AS DIRECTED [name], MD Taking Active   VASCEPA 1 g capsule [id number] No TAKE 2 CAPSULES TWICE DAILY Skeet Latch, MD Taking Active   vitamin B-12 (CYANOCOBALAMIN) 1000 MCG tablet [id number] No Take 1,000 mcg by mouth daily with breakfast. [provider] Taking Active Family Member  vitamin E 400 UNIT capsule [id number] No Take 400 Units by mouth at bedtime. [provider] Taking Active Family Member            Patient Active Problem List   Diagnosis Date Noted   Chronic pain of both knees 04/05/2022   Epilepsy ([place]) 03/12/2021   Adnexal mass 11/24/2020   Thickened endometrium 11/24/2020   Dementia ([place]) 10/22/2020   Delirium 06/25/2020   Actinic keratosis 11/17/2017   Anemia, iron deficiency 10/23/2015   CKD (chronic kidney disease), stage III ([place]) 07/25/2014   Osteoarthritis, knee 05/13/2014   Anxiety state 04/21/2014   Chronic diastolic CHF (congestive heart failure) ([place]) 03/13/2014   SOB (shortness of breath) 03/05/2014   CAD- RCA PCI '90s, STEMI-RCA DES 02/18/14 03/05/2014   Back pain, lumbosacral 10/10/2012   Type 2 diabetes mellitus without complication, with long-term current use of insulin ([place]) 03/26/2007   Hyperlipidemia associated with type 2 diabetes mellitus ([place]) 03/26/2007   Essential hypertension 04/21/2006   COPD (chronic obstructive pulmonary disease) ([place]) 04/21/2006    Immunization History  Administered Date(s) Administered   Fluad Quad(high Dose 65+) 08/17/2020   Influenza Split 04/21/2011, 05/16/2012   Influenza Whole 05/24/2007, 05/01/2009, 04/17/2010   Influenza, High Dose Seasonal PF 03/23/2018, 05/25/2019, 05/01/2021   Influenza,inj,Quad PF,6+ Mos 04/14/2013, 04/10/2014, 04/03/2015   Influenza-Unspecified 06/01/2016, 06/15/2017, 05/24/2020   Moderna Sars-Covid-2 Vaccination 10/25/2019, 11/22/2019, 10/03/2020, 08/09/2021   Pneumococcal Conjugate-13 09/22/2014, 05/25/2019    Pneumococcal Polysaccharide-23 04/18/2003   Tdap 07/29/2011   Zoster, Live 04/18/2011    Conditions to be addressed/monitored:  HTN, HLD, CHF, COPD, HLD, Dementia  There are no care plans that you recently modified to display for this patient.     Medication Assistance: None required.  Patient affirms current coverage meets needs.  Compliance/Adherence/Medication fill history: Care Gaps: Due for eye exam  Patient's preferred pharmacy is:  [facility] [id number] - , [address] [place] [address] Phone: [phone] Fax: [phone]  [place], [place] [address] Phone: [phone] Fax: [phone]  [place] [address] [place] [address] Phone: [phone] Fax: [phone]  Uses pill box? Yes Pt endorses 100% compliance  We discussed: Benefits of medication synchronization, packaging and delivery as well as enhanced pharmacist oversight with [employer]. Patient decided to: Continue current medication management strategy  Care Plan and Follow Up Patient Decision:  Patient agrees to Care Plan and Follow-up.  Plan: The care management team will reach out to the patient again over the next 180 days.  Beverly Milch, PharmD Clinical Pharmacist [phone])  [phone]    Current Barriers:  Recent fall and elevating BP  Pharmacist Clinical Goal(s):  Patient will achieve control of cough as evidenced by symptom levels through collaboration with PharmD and provider.   Interventions: 1:1 collaboration with [name], MD regarding development and update of comprehensive plan of care as evidenced by provider attestation and co-signature Inter-disciplinary care team collaboration (see longitudinal plan of care) Comprehensive medication review performed; medication list updated in  electronic medical record  Hypertension (BP goal <140/90) -Controlled -Current treatment: Enalapril 60m daily Appropriate, Effective, Safe, Accessible Amlodipine 2.571mdaily Appropriate, Effective, Safe, Accessible Carvedilol 12.8m28mtwice daily Appropriate, Effective, Safe, Accessible Hydralazine 28m49mree times per day Appropriate, Effective, Safe, Accessible Spironolactone 28mg61mly Appropriate, Effective, Safe, Accessible -Medications previously tried: none noted  -Current home readings: 110-1846-962olic, 140-1952-841 mornings. -Denies hypotensive/hypertensive symptoms -Educated on BP goals and benefits of medications for prevention of heart attack, stroke and kidney damage; Importance of home blood pressure monitoring; Symptoms of hypotension and importance of maintaining adequate hydration; -Counseled to monitor BP at home daily, document, and provide log at future appointments -BP seems to be well controlled, no symptoms -Recommended to continue current medication  Update 10/12/21 Denies any swelling lately. Tolerating dose changes well. BP at home has been well controlled. Has upcoming FU with PCP, will check in office. No changes at this time.  Diabetes (A1c goal <8%) -Controlled -Current medications: Metformin XR 500mg 18m daily Appropriate, Query effective,  Lantus 17-20 units daily Appropriate, Query effective,  -Medications previously tried: glimepiride  -Current home glucose readings - denies any hypoglycemia -Denies hypoglycemic/hyperglycemic symptoms -Educated on A1c and blood sugar goals; Prevention and management of hypoglycemic episodes; -Counseled to check feet daily and get yearly eye exams -Recommended to continue current medication  Update 10/12/21 Decreased metformin due to diarrhea - she has not noticed a ton of difference per her son since decreasing her dose.  Blood sugars seem to be more elevated than they were prior to her dose change.  Denies  any hypoglycemia.  Reports a few > 200 after eating.  Last A1c 6.3 which was excellent.   Counseled on importance of avoiding hypoglycemia - with goal of < 8.0 I would be hesitant to make any changes at this time without seeing full BG logs.  Plant to pring to Dr. HunterYong Channel on Thursday. No changes at this time - evaluate at upcoming visit.  Heart Failure (Goal: manage symptoms and prevent exacerbations) -Controlled -Last ejection fraction: 60-65%  -HF type: Diastolic -Current treatment: Carvedilol 12.8mg BI58murosemide 20mg da51m-Medications previously tried: none noted  -Current home BP/HR readings: "controlled' -Educated on Benefits of medications for managing symptoms and prolonging life Importance of weighing daily; if you gain more than 3 pounds in one day or 5 pounds in one week, contact providers Importance of blood pressure control Patient mentions cough over the past few weeks that happens throughout the day.  Used to be able to control it with cough drops.  Describes it as occasional clear mucous that she coughs up.  She is using both inhalers as prescribed.  She wants to know what she could take to help the cough.   -Counseled on anticholinergic effects of a lot of cold medications Will consult with PCP on next steps to help treat cough  Patient Goals/Self-Care Activities Patient will:  - take medications as prescribed check glucose daily, document, and provide at future appointments check blood pressure daily, document, and provide at future  appointments  Follow Up Plan: The care management team will reach out to the patient again over the next 80 days.

## 2022-04-25 ENCOUNTER — Ambulatory Visit (INDEPENDENT_AMBULATORY_CARE_PROVIDER_SITE_OTHER): Payer: Medicare HMO | Admitting: Pharmacist

## 2022-04-25 DIAGNOSIS — E119 Type 2 diabetes mellitus without complications: Secondary | ICD-10-CM

## 2022-04-25 DIAGNOSIS — I10 Essential (primary) hypertension: Secondary | ICD-10-CM

## 2022-04-26 NOTE — Patient Instructions (Addendum)
Visit Information   Goals Addressed             This Visit's Progress    Monitor and Manage My Blood Sugar-Diabetes Type 2   On track    Timeframe:  Long-Range Goal Priority:  High Start Date: 03/30/21                            Expected End Date:09/27/20                       Follow Up Date 06/28/21   - check blood sugar at prescribed times - check blood sugar if I feel it is too high or too low - take the blood sugar log to all doctor visits    Why is this important?   Checking your blood sugar at home helps to keep it from getting very high or very low.  Writing the results in a diary or log helps the doctor know how to care for you.  Your blood sugar log should have the time, date and the results.  Also, write down the amount of insulin or other medicine that you take.  Other information, like what you ate, exercise done and how you were feeling, will also be helpful.     Notes:        Patient Care Plan: General Pharmacy (Adult)     Problem Identified: HTN, HLD, CHF, COPD, HLD, Dementia   Priority: High  Onset Date: 03/30/2021     Long-Range Goal: Patient-Specific Goal   Start Date: 03/30/2021  Expected End Date: 09/27/2021  Recent Progress: On track  Priority: High  Note:   Current Barriers:  Recent fall and elevating BP  Pharmacist Clinical Goal(s):  Patient will achieve control of cough as evidenced by symptom levels through collaboration with PharmD and provider.   Interventions: 1:1 collaboration with [name], MD regarding development and update of comprehensive plan of care as evidenced by provider attestation and co-signature Inter-disciplinary care team collaboration (see longitudinal plan of care) Comprehensive medication review performed; medication list updated in electronic medical record  Hypertension (BP goal <140/90) 04/25/22 -Controlled -Current treatment: Enalapril '20mg'$  daily Appropriate, Effective, Safe, Accessible Amlodipine  2.'5mg'$  daily Appropriate, Effective, Safe, Accessible Carvedilol 12.'5mg'$   twice daily Appropriate, Effective, Safe, Accessible Hydralazine 50/'25mg'$  three times per day Appropriate, Effective, Safe, Accessible ('25mg'$  in the middle of the afternoon if BP elevated) Spironolactone '25mg'$  daily Appropriate, Effective, Safe, Accessible -Medications previously tried: none noted  -Current home readings: 454-098 systolic, 119-147 some mornings. -Denies hypotensive/hypertensive symptoms -Educated on BP goals and benefits of medications for prevention of heart attack, stroke and kidney damage; Importance of home blood pressure monitoring; Symptoms of hypotension and importance of maintaining adequate hydration; -BP has been better since recent changes in dosage.  Son was curious on if she should take middle of the day dose if BP was borderline.  Based on fall risk and recent falls, I counseled on if he was on the borderline of the parameters to skip the dose, he understands. -Denies any recent falls, patient voiced adherence and seems to be doing well overall.  Update 10/12/21 Denies any swelling lately. Tolerating dose changes well. BP at home has been well controlled. Has upcoming FU with PCP, will check in office. No changes at this time.  Diabetes (A1c goal <8%) 04/25/22 -Controlled -Current medications: Metformin XR '500mg'$  once daily Appropriate, Effective, Safe, Accessible  Lantus 17-20  units daily Appropriate, Effective, Safe, Accessible   -Medications previously tried: glimepiride  -Current home glucose readings - denies any hypoglycemia -Denies hypoglycemic/hyperglycemic symptoms -Educated on A1c and blood sugar goals; Prevention and management of hypoglycemic episodes; -Counseled to check feet daily and get yearly eye exams -Recommended to continue current medication -Most recent A1c well controlled at 6.6 - continues to be controlled with no hypoglycemia. No changes needed - continue to  monitor routinely.  Update 10/12/21 Decreased metformin due to diarrhea - she has not noticed a ton of difference per her son since decreasing her dose.  Blood sugars seem to be more elevated than they were prior to her dose change.  Denies any hypoglycemia.  Reports a few > 200 after eating.  Last A1c 6.3 which was excellent.   Counseled on importance of avoiding hypoglycemia - with goal of < 8.0 I would be hesitant to make any changes at this time without seeing full BG logs.  Plant to pring to Dr. Yong Channel visit on Thursday. No changes at this time - evaluate at upcoming visit.  Heart Failure (Goal: manage symptoms and prevent exacerbations) -Controlled -Last ejection fraction: 60-65%  -HF type: Diastolic -Current treatment: Carvedilol 12.'5mg'$  BID Furosemide '20mg'$  daily -Medications previously tried: none noted  -Current home BP/HR readings: "controlled' -Educated on Benefits of medications for managing symptoms and prolonging life Importance of weighing daily; if you gain more than 3 pounds in one day or 5 pounds in one week, contact providers Importance of blood pressure control Patient mentions cough over the past few weeks that happens throughout the day.  Used to be able to control it with cough drops.  Describes it as occasional clear mucous that she coughs up.  She is using both inhalers as prescribed.  She wants to know what she could take to help the cough.   -Counseled on anticholinergic effects of a lot of cold medications Will consult with PCP on next steps to help treat cough  Patient Goals/Self-Care Activities Patient will:  - take medications as prescribed check glucose daily, document, and provide at future appointments check blood pressure daily, document, and provide at future appointments  Follow Up Plan: The care management team will reach out to the patient again over the next 180 days.            The patient verbalized understanding of instructions,  educational materials, and care plan provided today and DECLINED offer to receive copy of patient instructions, educational materials, and care plan.  Telephone follow up appointment with pharmacy team member scheduled for: 6 months  Edythe Clarity, [place], PharmD Clinical Pharmacist  [facility] [phone]

## 2022-05-02 ENCOUNTER — Encounter (HOSPITAL_BASED_OUTPATIENT_CLINIC_OR_DEPARTMENT_OTHER): Payer: Self-pay | Admitting: Family

## 2022-05-02 ENCOUNTER — Other Ambulatory Visit: Payer: Self-pay | Admitting: Cardiovascular Disease

## 2022-05-02 ENCOUNTER — Ambulatory Visit (INDEPENDENT_AMBULATORY_CARE_PROVIDER_SITE_OTHER): Payer: Medicare HMO | Admitting: Family

## 2022-05-02 VITALS — BP 162/81 | HR 88 | Ht 65.0 in | Wt 182.2 lb

## 2022-05-02 DIAGNOSIS — E785 Hyperlipidemia, unspecified: Secondary | ICD-10-CM | POA: Diagnosis not present

## 2022-05-02 DIAGNOSIS — I25118 Atherosclerotic heart disease of native coronary artery with other forms of angina pectoris: Secondary | ICD-10-CM | POA: Diagnosis not present

## 2022-05-02 DIAGNOSIS — I10 Essential (primary) hypertension: Secondary | ICD-10-CM | POA: Diagnosis not present

## 2022-05-02 NOTE — Patient Instructions (Addendum)
Medication Instructions:  Continue current medications.   Hold afternoon Hydralazine if systolic blood pressure (top number) less than 110  Please take Amlodipine in the evening if your blood pressure is more than 145/90 _______________________  Today's elevated blood pressure may be due to headache or pain. If it is persistently elevated at home, please call us and let us know and we can consider adjustments.   *If you need a refill on your cardiac medications before your next appointment, please call your pharmacy*   Lab Work/Testing/Procedures: None ordered today   Follow-Up: At [facility], you and your health needs are our priority.  As part of our continuing mission to provide you with exceptional heart care, we have created designated Provider Care Teams.  These Care Teams include your primary Cardiologist (physician) and Advanced Practice Providers (APPs -  Physician Assistants and Nurse Practitioners) who all work together to provide you with the care you need, when you need it.  We recommend signing up for the patient portal called "MyChart".  Sign up information is provided on this After Visit Summary.  MyChart is used to connect with patients for Virtual Visits (Telemedicine).  Patients are able to view lab/test results, encounter notes, upcoming appointments, etc.  Non-urgent messages can be sent to your provider as well.   To learn more about what you can do with MyChart, go to [email or web address].    Your next appointment:   Phone visit 09/06/22 at 3:00 PM with Dr. Oval Linsey   If you want to switch to an in-person visit, simply call to let us know.  Other Instructions  Heart Healthy Diet Recommendations: A low-salt diet is recommended. Meats should be grilled, baked, or boiled. Avoid fried foods. Focus on lean protein sources like fish or chicken with vegetables and fruits. The American Heart Association is a Microbiologist!  American Heart Association Diet  and Lifeystyle Recommendations   Exercise recommendations: The American Heart Association recommends 150 minutes of moderate intensity exercise weekly. Try 30 minutes of moderate intensity exercise 4-5 times per week. This could include walking, jogging, or swimming.   Important Information About Sugar

## 2022-05-02 NOTE — Progress Notes (Signed)
Virtual Visit via Telephone Note   Because of Michelle [name]'s co-morbid illnesses, she is at least at moderate risk for complications without adequate follow up.  This format is felt to be most appropriate for this patient at this time.  The patient did not have access to video technology/had technical difficulties with video requiring transitioning to audio format only (telephone).  All issues noted in this document were discussed and addressed.  No physical exam could be performed with this format.  Please refer to the patient's chart for her consent to telehealth for [facility].    Date:  05/02/2022   ID:  Michelle [name], DOB 09-Jan-1933, MRN [id number] The patient was identified using 2 identifiers.  Patient Location: Home Provider Location: Office/Clinic   PCP:  [name], MD   [place] Providers Cardiologist:  Lauree Chandler, MD  / Dr. Oval Linsey [facility] Click to update primary MD,subspecialty MD or APP then REFRESH:1}    Evaluation Performed:  Follow-Up Visit  Chief Complaint:  BP follow up   History of Present Illness:    Michelle [name] is a 88 y.o. female with CAD (angioplasty of RCA 1990, inferior STEMI 02/2014 RCA treated with 2 covered stents), DM 2, HTN, HLD, prior tobacco use, chronic diastolic heart failure.  Established with advanced hypertension clinic February 2022.  Admitted 08/2020 with confusion BP 186/81, hypertensive encephalopathy.  Amlodipine, enalapril, spironolactone continued.  Metoprolol increased and hydralazine added.  Furosemide reduced due to concern for dehydration.  Cortisol normal limits.  Renal artery Dopplers 07/2020 unremarkable.  Most recent phone visit 02/22/2022 with Dr. Oval Linsey.  BP fluctuating 118/62-155/77 noted some breathing issues with improvement with nebulizer and albuterol.  Knee pain was being treated by Voltaren gel.  She was noted to have large Between her medications and encouraged to  take routinely 12 hours apart.  She had significant hypertension in the past so more aggressive treatment was not pursued.  Presents today for follow up. Virtual visit assisted by her son Michelle [name].   Midday when she is scheduled for her 2 pm meds - has often had to skip Hydralazine 2pm dose. If blood pressure is systolic 237S will skip dose. Has given 3 out of the last 7 days lunchtime dose. Lowest BP 98/49 in the afternoon. Highest BP 159/81. Has not described being lightheaded or dizzy. Using extra Amlodipine in the evening about 50% of the time. Some exertional dyspnea which improved with nebulizer. Still with occasional episodes of diarrhea.   She presents today for follow up via phone with her son Michelle [name]. Reports no shortness of breath nor dyspnea on exertion. Reports no chest pain, pressure, or tightness. No edema, orthopnea, PND. Reports no palpitations.  Notes her blood pressure has been elevated today. 30-40 minutes ago it was 162/81 with repeat 152/79. Did have headache and knee pain today which she notes may be contributory. More often, first BP 140-150 in the morning. Thereafter usually in the 110s-120s. She has not had recurrent significant hypotension. Requiring her evening Amlodipine for SBP >145/91 about three days per week. Holding afternoon Hydralazine if SBP <110. Was getting a lot of 111-119 but not often <110.   Past Medical History:  Diagnosis Date   Anginal pain ([place])    Arthritis    AV block, 1st degree    CAD (coronary artery disease) 1990; 2015   Cardiac cath 1990 with Dr. Lia Foyer and pt reports blockage in artery  with angioplasty. She has  pictures that show severe stenosis mid RCA and a post PTCA picture with 30% residual stenosis post PTCA. Residual CAD, non obstructive per 2015 cath. STEMI status post stent in August 2015.   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   Promus DES to RCA with STEMI   Shortness of breath    Past Surgical History:  Procedure Laterality Date   CATARACT EXTRACTION  2020   right and left eye    CHOLECYSTECTOMY     thinks her appendix was removed at the same time   [place]  02/18/14   Promus DES to RCA   [place] N/A 02/18/2014   Procedure: [place];  Surgeon: Jettie Booze, MD;  Location: [facility] CATH LAB;  Service: Cardiovascular;  Laterality: N/A;   PTCA  1990   PTCA of RCA   TRANSTHORACIC ECHOCARDIOGRAM  02/02/2012   mild LVH, EF 55-60%, Normal WM, Gr 1 DD; Mild MR     Current Meds  Medication Sig   acetaminophen (TYLENOL) 650 MG CR tablet Take 1,300 mg by mouth 3 (three) times daily. Patient take 3 times a day.   albuterol (PROVENTIL) (2.5 MG/3ML) 0.083% nebulizer solution USE THREE MILLILITERS VIA NEBULIZATION BY MOUTH EVERY 6 HOURS AS NEEDED FOR WHEEZING OR SHORTNESS OF BREATH   albuterol (VENTOLIN HFA) 108 (90 Base) MCG/ACT inhaler INHALE 1 PUFF EVERY 4 HOURS AS NEEDED FOR WHEEZING, SHORTNESS OF BREATH (RESCUE INHALER IF ADVAIR NOT WORKING)   Alcohol Swabs (DROPSAFE ALCOHOL PREP) 70 % PADS USE  FOR  TESTING THREE TIMES DAILY   amLODipine (NORVASC) 2.5 MG tablet Take 2.5 mg in the morning. If afternoon blood pressure >145/90 take second tablet of 2.5   aspirin 81 MG tablet Take 81 mg by mouth daily with breakfast.   Blood Glucose Calibration (ACCU-CHEK SMARTVIEW CONTROL) LIQD    blood glucose meter kit and supplies KIT Dispense based on patient and insurance preference. Use up to four times daily as directed. Dx:E11.9   calcium carbonate (OSCAL) 1500 (600 Ca) MG TABS tablet Take 600 mg of elemental  calcium by mouth daily with breakfast.   carvedilol (COREG) 12.5 MG tablet TAKE 1 TABLET TWICE DAILY   cholecalciferol (VITAMIN D3) 25 MCG (1000 UNIT) tablet Take 1,000 Units by mouth daily with breakfast.   clopidogrel (PLAVIX) 75 MG tablet TAKE 1 TABLET EVERY DAY   diclofenac Sodium (VOLTAREN) 1 % GEL Apply 2 g topically 3 (three) times daily as needed (pain).   DROPLET PEN NEEDLES 31G X 8 MM MISC USE AS DIRECTED WITH LANTUS SOLOSTAR PEN   enalapril (VASOTEC) 20 MG tablet TAKE 1 TABLET TWICE DAILY   fenofibrate micronized (LOFIBRA) 134 MG capsule TAKE 1 CAPSULE EVERY DAY   ferrous sulfate 325 (65 FE) MG tablet Twice a week (Patient taking differently: Take 325 mg by mouth 2 (two) times a week.)   fluticasone-salmeterol (ADVAIR) 250-50 MCG/ACT AEPB INHALE 1 PUFF TWICE DAILY   folic acid (FOLVITE) 473 MCG tablet Take 400 mcg by mouth daily with breakfast.   furosemide (LASIX) 20 MG tablet TAKE 1 TABLET EVERY DAY  hydrALAZINE (APRESOLINE) 25 MG tablet Take one 59m tablet daily in the afternoon. Take one 531mtablet in the morning and at bedtime.   hydrALAZINE (APRESOLINE) 50 MG tablet Take one 2559mablet daily in the afternoon. Take one 59m66mblet in the morning and at bedtime.   lamoTRIgine (LAMICTAL) 25 MG tablet Take 1 tablet twice a day   Lancets Misc. (ACCU-CHEK FASTCLIX LANCET) KIT 1 Device by Does not apply route. Use as directed for testing blood sugar   Lancets Misc. (ACCU-CHEK FASTCLIX LANCET) KIT Use as directed to test blood sugar   LANTUS SOLOSTAR 100 UNIT/ML Solostar Pen INJECT  17  TO  20 UNITS SUBCUTANEOUSLY EVERY MORNING   loratadine (CLARITIN) 10 MG tablet Take 10 mg by mouth every other day.   Melatonin 5 MG CAPS Take 5 mg by mouth at bedtime.   metFORMIN (GLUCOPHAGE-XR) 500 MG 24 hr tablet TAKE ONE TABLET BY MOUTH IN THE MORNING AND AT BEDTIME   nitroGLYCERIN (NITROSTAT) 0.4 MG SL tablet DISSOLVE 1 TAB UNDER TONGUE FOR CHEST PAIN - IF PAIN REMAINS AFTER 5 MIN, CALL 911 AND  REPEAT DOSE. MAX 3 TABS IN 15 MINUTES   nortriptyline (PAMELOR) 10 MG capsule Take 1 capsule (10 mg total) by mouth at bedtime. Daily at bedtime for pain prevention   pantoprazole (PROTONIX) 40 MG tablet TAKE 1 TABLET EVERY DAY   rosuvastatin (CRESTOR) 40 MG tablet TAKE 1 TABLET EVERY DAY   sertraline (ZOLOFT) 50 MG tablet TAKE 2 TABLETS AT BEDTIME   SPIRIVA HANDIHALER 18 MCG inhalation capsule PLACE 1 CAPSULE INTO HANDIHALER AND INHALE THE CONTENTS DAILY   spironolactone (ALDACTONE) 25 MG tablet TAKE 1 TABLET (25 MG TOTAL) BY MOUTH DAILY.   TRUE METRIX BLOOD GLUCOSE TEST test strip TEST BLOOD SUGAR  UP  TO FOUR TIMES DAILY AS DIRECTED   TRUEplus Lancets 33G MISC TEST BLOOD SUGAR UP TO FOUR TIMES DAILY AS DIRECTED   VASCEPA 1 g capsule TAKE 2 CAPSULES TWICE DAILY   vitamin B-12 (CYANOCOBALAMIN) 1000 MCG tablet Take 1,000 mcg by mouth daily with breakfast.   vitamin E 400 UNIT capsule Take 400 Units by mouth at bedtime.     Allergies:   Codeine sulfate and Morphine sulfate   Social History   Tobacco Use   Smoking status: Former    Packs/day: 1.00    Years: 55.00    Total pack years: 55.00    Types: Cigarettes    Quit date: 09/29/2006    Years since quitting: 15.6   Smokeless tobacco: Never  Vaping Use   Vaping Use: Never used  Substance Use Topics   Alcohol use: No   Drug use: No     Family Hx: The patient's family history includes Bone cancer in her brother; Cancer in her maternal grandfather, paternal grandfather, and paternal grandmother; Cancer (age of onset: 64) 57 her father; Diabetes in her son and son; Heart attack in her son; Heart attack (age of onset: 68) 37 her mother; Hyperlipidemia in her son; Hypertension in her son; Liver cancer in her brother. There is no history of Colon cancer, Ovarian cancer, or Uterine cancer.  ROS:   Please see the history of present illness.     All other systems reviewed and are negative.   Prior CV studies:   The following studies  were reviewed today:  Bilateral Renal Artery Doppler 07/29/2020: Summary:  Renal:  Right: Normal size right kidney. RRV flow present. No evidence of right renal artery stenosis.  Left:  Normal size of left kidney. LRV flow present. No evidence of left renal artery stenosis.     Echo 02/2014: Study Conclusions   - Left ventricle: The cavity size was normal. Systolic function was normal. The estimated ejection fraction was in the range of 60% to 65%. Wall motion was normal; there were no regional wall motion abnormalities.  - Aortic valve: There was mild to moderate regurgitation.  - Mitral valve: There was mild regurgitation.  - Left atrium: The atrium was mildly dilated.  - Pericardium, extracardiac: A trivial pericardial effusion was    identified posterior to the heart.    Labs/Other Tests and Data Reviewed:    EKG:  No ECG reviewed.  Recent Labs: 03/22/2022: ALT 11; BUN 43; Creatinine, Ser 1.18; Hemoglobin 11.9; Platelets 252.0; Potassium 4.6; Sodium 132   Recent Lipid Panel Lab Results  Component Value Date/Time   CHOL 133 07/08/2021 04:12 PM   TRIG (H) 07/08/2021 04:12 PM    430.0 Triglyceride is over 400; calculations on Lipids are invalid.   TRIG 1693 (HH) 07/28/2006 12:02 PM   HDL 44.30 07/08/2021 04:12 PM   CHOLHDL 3 07/08/2021 04:12 PM   LDLCALC 42 02/18/2020 03:29 PM   LDLDIRECT 44.0 07/08/2021 04:12 PM    Wt Readings from Last 3 Encounters:  05/02/22 182 lb 3.2 oz (82.6 kg)  03/22/22 187 lb (84.8 kg)  02/22/22 186 lb 9.6 oz (84.6 kg)        Objective:    Vital Signs:  BP (!) 162/81   Pulse 88   Ht _0  (1.651 m)   Wt 182 lb 3.2 oz (82.6 kg)   BMI 30.32 kg/m    VITAL SIGNS:  reviewed  ASSESSMENT & PLAN:    CAD - Stable with no anginal symptoms. No indication for ischemic evaluation.  GDMT Vascepa, rosuvastatin, as needed nitroglycerin, aspirin, carvedilol.  HTN -BP elevated today but otherwise has been well controlled at home.  Likely elevated  today due to headache, knee pain.  Her son will continue to monitor at home and report blood pressure consistently greater than 130/80.  Continue Hydralazine 50 mg a.m., 25 mg afternoon, 50 mg p.m. (hold afternoon Hydralazine dose if SBP <110). Continue carvedilol 12.5 mg twice daily, Lasix 20 mg daily, enalapril 20 mg twice daily, amlodipine 2.5 mg daily.  Continue amlodipine 2.5 mg as needed for SBP greater than 145/90 in the evening. BP mildly elevated today  HLD -continue rosuvastatin, Vascepa     Time:   Today, I have spent 15 minutes with the patient with telehealth technology discussing the above problems.     Medication Adjustments/Labs and Tests Ordered: Current medicines are reviewed at length with the patient today.  Concerns regarding medicines are outlined above.   Tests Ordered: No orders of the defined types were placed in this encounter.   Medication Changes: No orders of the defined types were placed in this encounter.   Follow Up:  Virtual Visit  in 4 month(s)  Signed, Loel Dubonnet, NP  05/02/2022 3:17 PM    [place]

## 2022-05-03 ENCOUNTER — Ambulatory Visit: Payer: Medicare HMO | Admitting: Neurology

## 2022-05-10 ENCOUNTER — Telehealth: Payer: Self-pay | Admitting: Family Medicine

## 2022-05-10 NOTE — Telephone Encounter (Signed)
[name] Michelle [name] requests to be called at ph# [phone] re: Michelle [name] states Patient's vaginal bleeding has returned.  Michelle [name] states he does not remember who Dr. Yong Channel referred Patient to go see for medical issue.

## 2022-05-10 NOTE — Telephone Encounter (Signed)
Previously seen by [facility]- see below- if they are able to see her again or if need referral- I'm ok with either- agree with vaginal bleeding makes sense for her to be seen   Office Visit  03/19/2021 [facility]  [name], MD Gynecologic Oncology Adnexal mass Dx Referred by [name], MD Reason for Visit   Additional Documentation  Encounter Info:  Billing Info, History, Allergies, Detailed Report    All Notes   Progress Notes by [name], MD at 03/19/2021 3:00 PM  Author: [name], MD Author Type: Physician Filed: 03/19/2021  7:15 PM  Note Status: Signed Cosign: Cosign Not Required Encounter Date: 03/19/2021  Editor: [name], MD (Physician)             Gynecologic Oncology Telehealth Consult Note: Gyn-Onc   I connected with [name] on 03/19/21 at  3:00 PM EDT by telephone and verified that I am speaking with the correct person using two identifiers.   I discussed the limitations, risks, security and privacy concerns of performing an evaluation and management service by telemedicine and the availability of in-person appointments. I also discussed with the patient that there may be a patient responsible charge related to this service. The patient expressed understanding and agreed to proceed.   Other persons participating in the visit and their role in the encounter: patient's son.   Patient's location: home   Reason for Visit: review recent ultrasound   Treatment History: Presented in March 2022 with PMB Pelvic ultrasound on 11/17/20 showed complex mass within left adnexa, cystic with septations, measuring up to 9.9cm with 1 solid nodule along the wall with vascularity. No free fluid noted. Endometrium thickened measuring 47m with feeder vessel identified. CA-125: 11, CEA-1.53 EMB on 5/10: benign endometrial polyp. Patient declined surgery in 11/2020 given concerns about cardiac  health and surviving surgery.    Interval History: Patient and her son, Michelle [name] present for phone visit with me today. She notes doing well since our last visit this spring. She endorses a good appetite without nausea, emesis, or early satiety. Weight is up and down, overall stable. She endorses regular bowel function, denies any abdominal or pelvic pain. She denies vaginal bleeding since her last visit with me.    Past Medical/Surgical History:     Past Medical History:  Diagnosis Date   [place]     Arthritis     AV block, 1st degree     CAD (coronary artery disease) 1990; 2015    Cardiac cath 1990 with Dr. SLia Foyerand [name] Residual CAD, non obstructive per 2015 cath. STEMI status post stent in August 2015.   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    Promus DES to RCA with STEMI   Shortness of breath             Past Surgical History:  Procedure Laterality Date   CATARACT EXTRACTION   2020  right and left eye    CHOLECYSTECTOMY        thinks her appendix was removed at the same time   [place]   02/18/14    Promus DES to RCA   [place] N/A 02/18/2014    Procedure: LEFT HEART CATHETERIZATION WITH CORONARY ANGIOGRAM;  Surgeon: Jettie Booze, MD;  Location: [facility] C[facility];  Service: Cardiovascular;  Laterality: N/A;   PTCA   1990    PTCA of RCA   TRANSTHORACIC ECHOCARDIOGRAM   02/02/2012    mild LVH, EF 55-60%, Normal WM, Gr 1  DD; Mild MR           Family History  Problem Relation Age of Onset   Heart attack Mother 11   Cancer Father 54   Cancer Maternal Grandfather     Cancer Paternal Grandmother     Cancer Paternal Grandfather     Diabetes Son     Liver cancer Brother     Bone cancer Brother     Heart attack Son     Hypertension Son     Diabetes Son     Hyperlipidemia Son     Colon cancer Neg Hx     Ovarian cancer Neg Hx     Uterine cancer Neg Hx        Social History         Socioeconomic History   Marital status: Single      Spouse name: Not on file   Number of children: 2   Years of education: Not on file   Highest education level: Not on file  Occupational History   Occupation: Retired      Fish farm manager: RETIRED  Tobacco Use   Smoking status: Former      Packs/day: 1.00      Years: 55.00      Pack years: 55.00      Types: Cigarettes      Quit date: 09/29/2006      Years since quitting: 14.4   Smokeless tobacco: Never  Vaping Use   Vaping Use: Never used  Substance and Sexual Activity   Alcohol use: No   Drug use: No   Sexual activity: Not Currently  Other Topics Concern   Not on file  Social History Narrative    Lives with her son but he works all day. Home for dinner.  Independent ADL. Needs assist on some IADL. Right handed     Social Determinants of Health       Financial Resource Strain: Low Risk    Difficulty of Paying Living Expenses: Not hard at all  Food Insecurity: No Food Insecurity   Worried About Charity fundraiser in the Last Year: Never true   Ran Out of Food in the Last Year: Never true  Transportation Needs: No Transportation Needs   Lack of Transportation (Medical): No   Lack of Transportation (Non-Medical): No  Physical Activity: Inactive   Days of Exercise per Week: 0 days   Minutes of Exercise per Session: 0 min  Stress: No Stress Concern Present   Feeling of Stress : Not at all  Social Connections: Unknown   Frequency of Communication with  Friends and Family: Once a week   Frequency of Social Gatherings with Friends and Family: Never   Attends Religious Services: Never   Marine scientist or Organizations: No   Attends Archivist Meetings: Never   Marital  Status: Not on file      Current Medications:   Current Outpatient Medications:    Accu-Chek FastClix Lancets MISC, Use to test blood sugars daily. Dx: E11.9, Disp: 100 each, Rfl: 2   ACCU-CHEK SMARTVIEW test strip, TEST BLOOD SUGAR THREE TIMES DAILY, Disp: 300 strip, Rfl: 1   acetaminophen (TYLENOL) 650 MG CR tablet, Take 1,300 mg by mouth 3 (three) times daily. Patient take 3 times a day., Disp: , Rfl:    albuterol (PROVENTIL) (2.5 MG/3ML) 0.083% nebulizer solution, USE THREE MILLILITERS VIA NEBULIZATION BY MOUTH EVERY 6 HOURS AS NEEDED FOR WHEEZING OR SHORTNESS OF BREATH (Patient taking differently: Take 2.5 mg by nebulization every 6 (six) hours as needed for wheezing or shortness of breath.), Disp: 75 mL, Rfl: 3   albuterol (VENTOLIN HFA) 108 (90 Base) MCG/ACT inhaler, INHALE 1 PUFF EVERY 4 HOURS AS NEEDED FOR WHEEZING, SHORTNESS OF BREATH (RESCUE INHALER IF ADVAIR NOT WORKING), Disp: 2 each, Rfl: 2   Alcohol Swabs (B-D SINGLE USE SWABS REGULAR) PADS, USE  FOR  TESTING THREE TIMES DAILY, Disp: 300 each, Rfl: 1   amLODipine (NORVASC) 10 MG tablet, TAKE 1 TABLET EVERY DAY (Patient taking differently: Take 10 mg by mouth daily.), Disp: 90 tablet, Rfl: 3   aspirin 81 MG tablet, Take 81 mg by mouth daily with breakfast., Disp: , Rfl:    Blood Glucose Calibration (ACCU-CHEK SMARTVIEW CONTROL) LIQD, , Disp: , Rfl:    blood glucose meter kit and supplies KIT, Dispense based on patient and insurance preference. Use up to four times daily as directed. (FOR ICD-9 250.00, 250.01)., Disp: 1 each, Rfl: 0   calcium carbonate (OSCAL) 1500 (600 Ca) MG TABS tablet, Take 600 mg of elemental calcium by mouth daily with breakfast., Disp: , Rfl:    carvedilol (COREG) 12.5 MG  tablet, Take 1 tablet (12.5 mg total) by mouth 2 (two) times daily., Disp: 60 tablet, Rfl: 3   cholecalciferol (VITAMIN D3) 25 MCG (1000 UNIT) tablet, Take 1,000 Units by mouth daily with breakfast., Disp: , Rfl:    clopidogrel (PLAVIX) 75 MG tablet, TAKE 1 TABLET EVERY DAY, Disp: 90 tablet, Rfl: 1   diclofenac Sodium (VOLTAREN) 1 % GEL, APPLY 2 GRAMS TOPICALLY TO AFFECTED AREA 4 TIMES DAILY, Disp: 300 g, Rfl: 3   DROPLET PEN NEEDLES 31G X 8 MM MISC, USE AS DIRECTED WITH LANTUS SOLOSTAR PEN, Disp: 100 each, Rfl: 4   enalapril (VASOTEC) 20 MG tablet, TAKE 1 TABLET TWICE DAILY (Patient taking differently: Take 20 mg by mouth 2 (two) times daily.), Disp: 180 tablet, Rfl: 3   fenofibrate micronized (LOFIBRA) 134 MG capsule, Take 1 capsule (134 mg total) by mouth daily., Disp: 90 capsule, Rfl: 3   ferrous sulfate 325 (65 FE) MG tablet, Twice a week (Patient taking differently: Take 325 mg by mouth 2 (two) times a week.), Disp: 18 tablet, Rfl: 3   Fluticasone-Salmeterol (ADVAIR) 250-50 MCG/DOSE AEPB, INHALE 1 PUFF TWICE DAILY (Patient taking differently: Inhale 1 puff into the lungs in the morning and at bedtime.), Disp: 180 each, Rfl: 3   folic acid (FOLVITE) 381 MCG tablet, Take 400 mcg by mouth daily with breakfast., Disp: , Rfl:    furosemide (LASIX) 20 MG tablet, Take 1 tablet (20 mg total) by mouth daily., Disp: 90 tablet, Rfl: 2   hydrALAZINE (APRESOLINE) 25 MG tablet, TAKE ONE TABLET BY MOUTH EVERY MORNING AS NEEDED FOR HIGH BLOOD PRESSURE, Disp: 90 tablet, Rfl: 1   hydrALAZINE (APRESOLINE) 50 MG  tablet, Take 1 tablet (50 mg total) by mouth in the morning and at bedtime., Disp: 180 tablet, Rfl: 1   icosapent Ethyl (VASCEPA) 1 g capsule, Take 2 capsules (2 g total) by mouth 2 (two) times daily., Disp: 360 capsule, Rfl: 3   lamoTRIgine (LAMICTAL) 25 MG tablet, Take 1 tablet (25 mg total) by mouth 2 (two) times daily., Disp: 180 tablet, Rfl: 3   Lancets Misc. (ACCU-CHEK FASTCLIX LANCET) KIT, 1 Device  by Does not apply route. Use as directed for testing blood sugar, Disp: , Rfl:    Lancets Misc. (ACCU-CHEK FASTCLIX LANCET) KIT, Use as directed to test blood sugar, Disp: 1 kit, Rfl: 1   LANTUS SOLOSTAR 100 UNIT/ML Solostar Pen, INJECT  17  TO  20 UNITS SUBCUTANEOUSLY EVERY MORNING (Patient taking differently: Inject 18 Units into the skin 2 (two) times daily.), Disp: 30 mL, Rfl: 5   loratadine (CLARITIN) 10 MG tablet, Take 10 mg by mouth every other day., Disp: , Rfl:    Melatonin 5 MG CAPS, Take 5 mg by mouth at bedtime., Disp: , Rfl:    metFORMIN (GLUCOPHAGE-XR) 500 MG 24 hr tablet, Take 1 tablet (500 mg total) by mouth in the morning and at bedtime., Disp: 180 tablet, Rfl: 3   nitroGLYCERIN (NITROSTAT) 0.4 MG SL tablet, PLACE 1 TABLET (0.4 MG TOTAL) UNDER THE TONGUE EVERY 5 (FIVE) MINUTESAS NEEDED FOR CHEST PAIN (CHEST PAIN OR SHORTNESS OF BREATH). (Patient taking differently: Place 0.4 mg under the tongue every 5 (five) minutes as needed for chest pain (max 3 doses).), Disp: 25 tablet, Rfl: 6   oxyCODONE (OXY IR/ROXICODONE) 5 MG immediate release tablet, Take 0.5-1 tablets (2.5-5 mg total) by mouth every 6 (six) hours as needed for severe pain. For AFTER surgery only, do not take and drive (Patient not taking: Reported on 03/17/2021), Disp: 10 tablet, Rfl: 0   pantoprazole (PROTONIX) 40 MG tablet, TAKE 1 TABLET EVERY DAY, Disp: 90 tablet, Rfl: 1   rosuvastatin (CRESTOR) 40 MG tablet, TAKE 1 TABLET EVERY DAY, Disp: 90 tablet, Rfl: 0   senna-docusate (SENOKOT-S) 8.6-50 MG tablet, Take 2 tablets by mouth at bedtime. For AFTER surgery, do not take if having diarrhea, Disp: 30 tablet, Rfl: 0   sertraline (ZOLOFT) 50 MG tablet, TAKE 2 TABLETS AT BEDTIME, Disp: 60 tablet, Rfl: 0   spironolactone (ALDACTONE) 25 MG tablet, Take 1 tablet (25 mg total) by mouth daily. (Patient taking differently: Take 25 mg by mouth daily with breakfast.), Disp: 90 tablet, Rfl: 3   tiotropium (SPIRIVA HANDIHALER) 18 MCG  inhalation capsule, Place 1 capsule (18 mcg total) into inhaler and inhale daily. (Patient taking differently: Place 18 mcg into inhaler and inhale daily before breakfast.), Disp: 90 capsule, Rfl: 2   vitamin B-12 (CYANOCOBALAMIN) 1000 MCG tablet, Take 1,000 mcg by mouth daily with breakfast., Disp: , Rfl:    vitamin E 400 UNIT capsule, Take 400 Units by mouth at bedtime., Disp: , Rfl:    Review of Symptoms: Pertinent positives as per HPI.   Physical Exam: There were no vitals taken for this visit. Deferred given limitations of phone visit   Laboratory & Radiologic Studies: 03/08/21: Pelvic ultrasound 1. 10.7 x 7.9 x 11.2 cm complex cystic left adnexal mass with mural nodularity, concerning for a primary ovarian neoplasm. No free fluid within the pelvis. The native ovaries are not visualized. Additionally, further assessment with dedicated MRI of the pelvis, with and without contrast, could be performed for further characterization as warranted.  2. Thickened endometrial complex measuring up to 16.9 mm with internal microcystic change and vascularity. Endometrial sampling should be considered to exclude carcinoma. 3. 1.2 cm calcified intramural fibroid.   Assessment & Plan: Michelle [name] is a 88 y.o. woman with mildly enlarging complex pelvic mass.   The patient remains relatively asymptomatic. I reviewed findings from recent ultrasound. Her endometrium continues to appear thickened. Given biopsy in May showing benign endometrial polyp, I don't see findings on imaging to suspect a malignant uterine process.   With regards to her ovarian mass, this has grown some and has some additional features that raise my concern for possible malignancy. The patient is still very fearful of surgery, stating that she "thinks [she] is too old and won't survive being cut." I offered reassurance as I could although acknowledged that her medical comorbidities, notably her heart disease, put her at increased  surgical risk.   In terms of options moving forward, we discussed plan for surgery now (could do least invasive with D&C and robotic BSO) versus pelvic MRI to better characterize the ovarian mass. The patient and her son would like to get an MRI prior to making a decision about surgery. We will plan to discuss results by phone once her MRI has been performed and will make a final decision about proceeding with any surgical intervention.   I discussed the assessment and treatment plan with the patient. The patient was provided with an opportunity to ask questions and all were answered. The patient agreed with the plan and demonstrated an understanding of the instructions.   The patient was advised to call back or see an in-person evaluation if the symptoms worsen or if the condition fails to improve as anticipated.    28 minutes of total time was spent for this patient encounter, including preparation, over-the-phone counseling with the patient and coordination of care, and documentation of the encounter.     Jeral Pinch, MD  Division of Gynecologic Oncology  Department of Obstetrics and Gynecology  [facility]          Instructions  After Visit Summary (Automatic SnapShot taken 03/19/2021) Communications  View All Conversations on this Encounter  [facility] Provider CC Chart Rep sent to [name], MD Communication Routing History  Recipient Method Sent by Date Sent  [name], MD In Gabrielle Dare, MD 03/19/2021      No questionnaires available.            Orders Placed   MR Pelvis W Wo Contrast Medication Changes   None Medication List Visit Diagnoses   Adnexal mass Problem List Encounter Status  Signed by [name], MD on 03/19/21 at 19:15 Level of Service  Level of Service Modifiers  PR PHYS/QHP TELEPHONE EVALUATION 21-30 MIN [27078] Telemedicine Service Rendered Via Audio/Video [95]  Log History   LOS History   All Charges for This Encounter  Code Description Service Date Service Provider Modifiers Qty  [phone] PR PHYS/QHP TELEPHONE EVALUATION 21-30 MIN 03/19/2021 [name], MD 30, DUM 1

## 2022-05-10 NOTE — Telephone Encounter (Signed)
I do not see anything listed under last visit. Does patient need OV to exam and discuss?

## 2022-05-11 ENCOUNTER — Telehealth: Payer: Self-pay | Admitting: Surgery

## 2022-05-11 NOTE — Telephone Encounter (Signed)
I haven't seen this patient in over a year. She will need an appointment.

## 2022-05-11 NOTE — Telephone Encounter (Signed)
Attempted to contact patient's son, no answer or voicemail I sent mychart message to patient's son with PCP recommendations and listed Dr. Lulu Riding number to call for an appt

## 2022-05-11 NOTE — Telephone Encounter (Signed)
[name] called in, stating patient is having vaginal bleeding. Blood is bright red and bleeding started 10/20. Denies other symptoms such as pain, cramping or fevers. Spoke with patient and she is wearing depends and has to change them 3 times a day due to the bleeding. Denies any blood clots. Son states he called Dr Ansel Bong office yesterday and was referred to Dr Berline Lopes for management. Information relayed to Dr Berline Lopes. Will follow up with patient.

## 2022-05-11 NOTE — Telephone Encounter (Signed)
Patient called and informed Dr Berline Lopes wants to see her. Per Dr Berline Lopes, patient scheduled for 10/31 at 12:15pm. Patient verbalized understanding and had no other questions at this time.

## 2022-05-16 ENCOUNTER — Encounter: Payer: Self-pay | Admitting: Gynecologic Oncology

## 2022-05-17 ENCOUNTER — Encounter: Payer: Self-pay | Admitting: Gynecologic Oncology

## 2022-05-17 ENCOUNTER — Other Ambulatory Visit: Payer: Self-pay

## 2022-05-17 ENCOUNTER — Inpatient Hospital Stay: Payer: Medicare HMO | Attending: Gynecologic Oncology | Admitting: Gynecologic Oncology

## 2022-05-17 VITALS — BP 124/62 | HR 73 | Temp 98.1°F | Resp 18 | Wt 178.4 lb

## 2022-05-17 DIAGNOSIS — Z801 Family history of malignant neoplasm of trachea, bronchus and lung: Secondary | ICD-10-CM | POA: Insufficient documentation

## 2022-05-17 DIAGNOSIS — E119 Type 2 diabetes mellitus without complications: Secondary | ICD-10-CM | POA: Diagnosis not present

## 2022-05-17 DIAGNOSIS — Z7984 Long term (current) use of oral hypoglycemic drugs: Secondary | ICD-10-CM | POA: Insufficient documentation

## 2022-05-17 DIAGNOSIS — Z79899 Other long term (current) drug therapy: Secondary | ICD-10-CM | POA: Diagnosis not present

## 2022-05-17 DIAGNOSIS — I252 Old myocardial infarction: Secondary | ICD-10-CM | POA: Insufficient documentation

## 2022-05-17 DIAGNOSIS — Z7951 Long term (current) use of inhaled steroids: Secondary | ICD-10-CM | POA: Diagnosis not present

## 2022-05-17 DIAGNOSIS — I10 Essential (primary) hypertension: Secondary | ICD-10-CM | POA: Insufficient documentation

## 2022-05-17 DIAGNOSIS — N840 Polyp of corpus uteri: Secondary | ICD-10-CM | POA: Diagnosis not present

## 2022-05-17 DIAGNOSIS — N950 Postmenopausal bleeding: Secondary | ICD-10-CM | POA: Diagnosis not present

## 2022-05-17 DIAGNOSIS — Z809 Family history of malignant neoplasm, unspecified: Secondary | ICD-10-CM | POA: Diagnosis not present

## 2022-05-17 DIAGNOSIS — Z7982 Long term (current) use of aspirin: Secondary | ICD-10-CM | POA: Insufficient documentation

## 2022-05-17 DIAGNOSIS — Z7902 Long term (current) use of antithrombotics/antiplatelets: Secondary | ICD-10-CM | POA: Insufficient documentation

## 2022-05-17 DIAGNOSIS — E785 Hyperlipidemia, unspecified: Secondary | ICD-10-CM | POA: Insufficient documentation

## 2022-05-17 DIAGNOSIS — N9489 Other specified conditions associated with female genital organs and menstrual cycle: Secondary | ICD-10-CM

## 2022-05-17 DIAGNOSIS — Z794 Long term (current) use of insulin: Secondary | ICD-10-CM

## 2022-05-17 DIAGNOSIS — I509 Heart failure, unspecified: Secondary | ICD-10-CM

## 2022-05-17 DIAGNOSIS — E1159 Type 2 diabetes mellitus with other circulatory complications: Secondary | ICD-10-CM | POA: Diagnosis not present

## 2022-05-17 DIAGNOSIS — N939 Abnormal uterine and vaginal bleeding, unspecified: Secondary | ICD-10-CM | POA: Diagnosis not present

## 2022-05-17 DIAGNOSIS — R9389 Abnormal findings on diagnostic imaging of other specified body structures: Secondary | ICD-10-CM

## 2022-05-17 DIAGNOSIS — I110 Hypertensive heart disease with heart failure: Secondary | ICD-10-CM

## 2022-05-17 DIAGNOSIS — J449 Chronic obstructive pulmonary disease, unspecified: Secondary | ICD-10-CM | POA: Diagnosis not present

## 2022-05-17 DIAGNOSIS — I2510 Atherosclerotic heart disease of native coronary artery without angina pectoris: Secondary | ICD-10-CM | POA: Insufficient documentation

## 2022-05-17 NOTE — Progress Notes (Signed)
Gynecologic Oncology Return Clinic Visit  05/17/22  Reason for Visit: vaginal bleeding  Treatment History: Presented in March 2022 with PMB Pelvic ultrasound on 11/17/20 showed complex mass within left adnexa, cystic with septations, measuring up to 9.9cm with 1 solid nodule along the wall with vascularity. No free fluid noted. Endometrium thickened measuring 71m with feeder vessel identified. CA-125: 11, CEA-1.53 EMB on 5/10: benign endometrial polyp. Patient declined surgery in 11/2020 given concerns about cardiac health and surviving surgery.   03/08/21: Pelvic ultrasound 1. 10.7 x 7.9 x 11.2 cm complex cystic left adnexal mass with mural nodularity, concerning for a primary ovarian neoplasm. No free fluid within the pelvis. The native ovaries are not visualized. Additionally, further assessment with dedicated MRI of the pelvis, with and without contrast, could be performed for further characterization as warranted. 2. Thickened endometrial complex measuring up to 16.9 mm with internal microcystic change and vascularity. Endometrial sampling should be considered to exclude carcinoma. 3. 1.2 cm calcified intramural fibroid.  05/15/2021: MRI pelvis 1. 13 x 11 x 8.5 cm complex cystic left adnexal mass. Possible serous cystadenoma but recommend gyn consultation consideration for excision. 2. Abnormally thickened endometrium for age with heterogeneous contrast enhancement. Findings suspicious for endometrial cancer. Recommend endometrial sampling. 3. The right ovary is normal. 4. No pelvic adenopathy.  After our discussion about possible surgical intervention, the patient and her family ultimately decided against any surgical intervention.  I decided at that time that they would plan to repeat imaging in 6-8 months and would call to let uKoreaknow.  Interval History: The patient's son called on 10/25 reporting the patient began having bleeding starting 10/20.  She is otherwise  asymptomatic.  Patient notes today that bleeding started on a Friday and resolved last Friday, lasting a week.  She notes that this was enough to wear a pad but not as heavy as a menses.  She denies any spotting since last Friday.  She denies any associated pain or cramping.  She was having diarrhea previously, this has resolved.  She denies any bladder symptoms.  She endorses being fairly sick earlier this year.  Her appetite was decreased for a period of time but now seems to be back to her baseline.  She notes having some memory issues.  Last Hemoglobin A1c in 03/2022 was 6.6%.  Past Medical/Surgical History: Past Medical History:  Diagnosis Date   Anginal pain ([facility]    Arthritis    AV block, 1st degree    CAD (coronary artery disease) 1990; 2015   Cardiac cath 1990 with Dr. SLia Foyerand [name] Residual CAD, non obstructive per 2015 cath. STEMI status post stent in August 2015.   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   Promus DES to RCA with STEMI   Shortness of breath     Past Surgical History:  Procedure Laterality Date   CATARACT EXTRACTION  2020   right and left eye    CHOLECYSTECTOMY     thinks her appendix was removed at the same  time   CORONARY ANGIOPLASTY WITH STENT PLACEMENT  02/18/14   Promus DES to RCA   LEFT HEART CATHETERIZATION WITH CORONARY ANGIOGRAM N/A 02/18/2014   Procedure: LEFT HEART CATHETERIZATION WITH CORONARY ANGIOGRAM;  Surgeon: Jettie Booze, MD;  Location: [facility] CATH LAB;  Service:  Cardiovascular;  Laterality: N/A;   PTCA  1990   PTCA of RCA   TRANSTHORACIC ECHOCARDIOGRAM  02/02/2012   mild LVH, EF 55-60%, Normal WM, Gr 1 DD; Mild MR    Family History  Problem Relation Age of Onset   Heart attack Mother 88   Cancer Father 7   Cancer Maternal Grandfather    Cancer Paternal Grandmother    Cancer Paternal Grandfather    Diabetes Son    Liver cancer Brother    Bone cancer Brother    Heart attack Son    Hypertension Son    Diabetes Son    Hyperlipidemia Son    Colon cancer Neg Hx    Ovarian cancer Neg Hx    Uterine cancer Neg Hx     Social History   Socioeconomic History   Marital status: Single    Spouse name: Not on file   Number of children: 2   Years of education: Not on file   Highest education level: Not on file  Occupational History   Occupation: Retired    Fish farm manager: RETIRED  Tobacco Use   Smoking status: Former    Packs/day: 1.00    Years: 55.00    Total pack years: 55.00    Types: Cigarettes    Quit date: 09/29/2006    Years since quitting: 15.6   Smokeless tobacco: Never  Vaping Use   Vaping Use: Never used  Substance and Sexual Activity   Alcohol use: No   Drug use: No   Sexual activity: Not Currently  Other Topics Concern   Not on file  Social History Narrative   Lives with her son - 2 very supportive sons   Right handed   Social Determinants of Health   Financial Resource Strain: Low Risk  (06/07/2021)   Overall Financial Resource Strain (CARDIA)    Difficulty of Paying Living Expenses: Not hard at all  Food Insecurity: No Food Insecurity (06/07/2021)   Hunger Vital Sign    Worried About Running Out of Food in the Last Year: Never true    Ran Out of Food in the Last Year: Never true  Transportation Needs: No Transportation Needs (06/07/2021)   PRAPARE - Hydrologist (Medical): No    Lack of Transportation (Non-Medical): No  Physical Activity: Inactive (06/07/2021)   Exercise Vital Sign     Days of Exercise per Week: 0 days    Minutes of Exercise per Session: 0 min  Stress: No Stress Concern Present (06/07/2021)   [place]    Feeling of Stress : Not at all  Social Connections: Socially Isolated (06/07/2021)   Social Connection and Isolation Panel [NHANES]    Frequency of Communication with Friends and Family: Once a week    Frequency of Social Gatherings with Friends and Family: Once a week    Attends Religious Services: Never    Marine scientist or Organizations: No    Attends Archivist Meetings: Never    Marital Status: Never married    Current Medications:  Current Outpatient Medications:    acetaminophen (TYLENOL) 650 MG CR tablet, Take 1,300 mg by  mouth 3 (three) times daily. Patient take 3 times a day., Disp: , Rfl:    albuterol (PROVENTIL) (2.5 MG/3ML) 0.083% nebulizer solution, USE THREE MILLILITERS VIA NEBULIZATION BY MOUTH EVERY 6 HOURS AS NEEDED FOR WHEEZING OR SHORTNESS OF BREATH, Disp: 75 mL, Rfl: 3   albuterol (VENTOLIN HFA) 108 (90 Base) MCG/ACT inhaler, INHALE 1 PUFF EVERY 4 HOURS AS NEEDED FOR WHEEZING, SHORTNESS OF BREATH (RESCUE INHALER IF ADVAIR NOT WORKING), Disp: 2 each, Rfl: 2   Alcohol Swabs (DROPSAFE ALCOHOL PREP) 70 % PADS, USE  FOR  TESTING THREE TIMES DAILY, Disp: 300 each, Rfl: 1   amLODipine (NORVASC) 2.5 MG tablet, Take 2.5 mg in the morning. If afternoon blood pressure >145/90 take second tablet of 2.5, Disp: 180 tablet, Rfl: 3   aspirin 81 MG tablet, Take 81 mg by mouth daily with breakfast., Disp: , Rfl:    Blood Glucose Calibration (ACCU-CHEK SMARTVIEW CONTROL) LIQD, , Disp: , Rfl:    blood glucose meter kit and supplies KIT, Dispense based on patient and insurance preference. Use up to four times daily as directed. Dx:E11.9, Disp: 1 each, Rfl: 3   calcium carbonate (OSCAL) 1500 (600 Ca) MG TABS tablet, Take 600 mg of elemental calcium by mouth daily with  breakfast., Disp: , Rfl:    carvedilol (COREG) 12.5 MG tablet, TAKE 1 TABLET TWICE DAILY, Disp: 180 tablet, Rfl: 3   cholecalciferol (VITAMIN D3) 25 MCG (1000 UNIT) tablet, Take 1,000 Units by mouth daily with breakfast., Disp: , Rfl:    clopidogrel (PLAVIX) 75 MG tablet, TAKE 1 TABLET EVERY DAY, Disp: 90 tablet, Rfl: 1   diclofenac Sodium (VOLTAREN) 1 % GEL, Apply 2 g topically 3 (three) times daily as needed (pain)., Disp: 50 g, Rfl: 0   DROPLET PEN NEEDLES 31G X 8 MM MISC, USE AS DIRECTED WITH LANTUS SOLOSTAR PEN, Disp: 100 each, Rfl: 4   enalapril (VASOTEC) 20 MG tablet, TAKE 1 TABLET TWICE DAILY, Disp: 180 tablet, Rfl: 3   fenofibrate micronized (LOFIBRA) 134 MG capsule, TAKE 1 CAPSULE EVERY DAY, Disp: 90 capsule, Rfl: 3   ferrous sulfate 325 (65 FE) MG tablet, Twice a week (Patient taking differently: Take 325 mg by mouth 2 (two) times a week.), Disp: 18 tablet, Rfl: 3   fluticasone-salmeterol (ADVAIR) 250-50 MCG/ACT AEPB, INHALE 1 PUFF TWICE DAILY, Disp: 180 each, Rfl: 3   folic acid (FOLVITE) 517 MCG tablet, Take 400 mcg by mouth daily with breakfast., Disp: , Rfl:    furosemide (LASIX) 20 MG tablet, TAKE 1 TABLET EVERY DAY, Disp: 90 tablet, Rfl: 3   hydrALAZINE (APRESOLINE) 25 MG tablet, Take one 67m tablet daily in the afternoon. Take one 514mtablet in the morning and at bedtime., Disp: 90 tablet, Rfl: 3   hydrALAZINE (APRESOLINE) 50 MG tablet, Take one 254mablet daily in the afternoon. Take one 49m69mblet in the morning and at bedtime., Disp: 180 tablet, Rfl: 3   lamoTRIgine (LAMICTAL) 25 MG tablet, Take 1 tablet twice a day, Disp: 180 tablet, Rfl: 3   Lancets Misc. (ACCU-CHEK FASTCLIX LANCET) KIT, 1 Device by Does not apply route. Use as directed for testing blood sugar, Disp: , Rfl:    Lancets Misc. (ACCU-CHEK FASTCLIX LANCET) KIT, Use as directed to test blood sugar, Disp: 1 kit, Rfl: 1   LANTUS SOLOSTAR 100 UNIT/ML Solostar Pen, INJECT  17  TO  20 UNITS SUBCUTANEOUSLY EVERY  MORNING, Disp: 30 mL, Rfl: 5   loratadine (CLARITIN) 10 MG tablet, Take 10  mg by mouth every other day., Disp: , Rfl:    Melatonin 5 MG CAPS, Take 5 mg by mouth at bedtime., Disp: , Rfl:    metFORMIN (GLUCOPHAGE-XR) 500 MG 24 hr tablet, TAKE ONE TABLET BY MOUTH IN THE MORNING AND AT BEDTIME, Disp: 180 tablet, Rfl: 3   nitroGLYCERIN (NITROSTAT) 0.4 MG SL tablet, DISSOLVE 1 TAB UNDER TONGUE FOR CHEST PAIN - IF PAIN REMAINS AFTER 5 MIN, CALL 911 AND REPEAT DOSE. MAX 3 TABS IN 15 MINUTES, Disp: 25 tablet, Rfl: 6   nortriptyline (PAMELOR) 10 MG capsule, Take 1 capsule (10 mg total) by mouth at bedtime. Daily at bedtime for pain prevention, Disp: 30 capsule, Rfl: 1   pantoprazole (PROTONIX) 40 MG tablet, TAKE 1 TABLET EVERY DAY, Disp: 90 tablet, Rfl: 1   rosuvastatin (CRESTOR) 40 MG tablet, TAKE 1 TABLET EVERY DAY, Disp: 90 tablet, Rfl: 0   sertraline (ZOLOFT) 50 MG tablet, TAKE 2 TABLETS AT BEDTIME, Disp: 180 tablet, Rfl: 2   SPIRIVA HANDIHALER 18 MCG inhalation capsule, PLACE 1 CAPSULE INTO HANDIHALER AND INHALE THE CONTENTS DAILY, Disp: 90 capsule, Rfl: 2   spironolactone (ALDACTONE) 25 MG tablet, TAKE 1 TABLET (25 MG TOTAL) BY MOUTH DAILY., Disp: 90 tablet, Rfl: 3   TRUE METRIX BLOOD GLUCOSE TEST test strip, TEST BLOOD SUGAR  UP  TO FOUR TIMES DAILY AS DIRECTED, Disp: 400 strip, Rfl: 3   TRUEplus Lancets 33G MISC, TEST BLOOD SUGAR UP TO FOUR TIMES DAILY AS DIRECTED, Disp: 400 each, Rfl: 3   VASCEPA 1 g capsule, TAKE 2 CAPSULES TWICE DAILY, Disp: 360 capsule, Rfl: 0   vitamin B-12 (CYANOCOBALAMIN) 1000 MCG tablet, Take 1,000 mcg by mouth daily with breakfast., Disp: , Rfl:    vitamin E 400 UNIT capsule, Take 400 Units by mouth at bedtime., Disp: , Rfl:   Review of Systems: Denies appetite changes, fevers, chills, fatigue, unexplained weight changes. Denies hearing loss, neck lumps or masses, mouth sores, ringing in ears or voice changes. Denies cough or wheezing.  Denies shortness of breath. Denies  chest pain or palpitations. Denies leg swelling. Denies abdominal distention, pain, blood in stools, constipation, diarrhea, nausea, vomiting, or early satiety. Denies pain with intercourse, dysuria, frequency, hematuria or incontinence. Denies hot flashes, pelvic pain, vaginal bleeding or vaginal discharge.   Denies joint pain, back pain or muscle pain/cramps. Denies itching, rash, or wounds. Denies dizziness, headaches, numbness or seizures. Denies swollen lymph nodes or glands, denies easy bruising or bleeding. Denies anxiety, depression, confusion, or decreased concentration.  Physical Exam: BP 124/62 (BP Location: Right Arm, Patient Position: Sitting)   Pulse 73   Temp 98.1 F (36.7 C)   Resp 18   Wt 178 lb 6.4 oz (80.9 kg)   SpO2 100%   BMI 29.69 kg/m  General: Alert, oriented, no acute distress. HEENT: Atraumatic, normocephalic, sclera anicteric. Chest: Clear to auscultation bilaterally. No wheezes, rhonchi, or rales. Cardiovascular: Regular rate and rhythm, no murmurs, rubs, or gallops.  Abdomen: Obese. Normoactive bowel sounds. Soft, nondistended, nontender to palpation. No masses or hepatosplenomegaly appreciated. No palpable fluid wave.  Large well-healed right upper quadrant incision. Extremities: Grossly normal range of motion. Warm, well perfused. 1+ edema bilaterally.  Lymphatics: No cervical, supraclavicular, or inguinal adenopathy. GU: Normal appearing external genitalia without erythema, excoriation, or lesions.  Speculum exam reveals mildly atrophic, no lesions noted.  Cervix normal in appearance.  Bimanual exam reveals small, anteverted uterus, mobile.  Large mobile and smooth mass noted in the cul-de-sac.  EMB procedure Preoperative diagnosis: Postmenopausal bleeding Postoperative diagnosis: Same as above Procedure: Endometrial biopsy Physician: Berline Lopes MD Specimens: endometrial biopsy The procedure was discussed with the patient. The speculum was placed and the  cervix was prepped with Betadine x3.  A single-tooth tenaculum was placed on the anterior lip of the cervix.  The endometrial Pipelle was then inserted to a depth of just over 8 cm before resistance met.  1 pass was performed with scant polypoid tissue and some mucus.  This was placed in formalin to be sent to pathology.  No significant bleeding noted.  All instruments were removed from the vagina.  Overall the patient tolerated the procedure well.  Laboratory & Radiologic Studies: None new  Assessment & Plan: KAHLYN SHIPPEY is a 88 y.o. woman with recurrent postmenopausal bleeding in the setting of a known thickened endometrial lining, suspected polyp, as well as a complex adnexal mass.  I discussed management options with the patient as well as her 2 sons, who are with her today.  The patient still feels very strongly that she does not want any surgery and would not want any cancer directed therapy.  I recommended endometrial biopsy today to rule out pathology or, hopefully, to confirm that this is still related to the endometrial polyp we saw on biopsy over a year ago.  In that case, we could consider starting oral progesterone to hopefully help prevent any future episodes of vaginal bleeding.  I have also recommended pelvic ultrasound to reassess her endometrial lining as well as the known pelvic mass.  Based on these results, I will call the patient and her sons regarding recommendations.    22 minutes of total time was spent for this patient encounter, including preparation, face-to-face counseling with the patient and coordination of care, and documentation of the encounter.  Jeral Pinch, MD  Division of Gynecologic Oncology  Department of Obstetrics and Gynecology  [facility]

## 2022-05-17 NOTE — Patient Instructions (Signed)
I will call you with biopsy results, which should be back later this week.  The office is also getting you scheduled for a pelvic ultrasound to take a look at the lining of your uterus as well as the pelvic mass that we know about from last year.  Once we have all of these results, we will discuss whether any additional procedures are necessary.

## 2022-05-17 NOTE — Addendum Note (Signed)
Addended by: [name] on: 05/17/2022 03:43 PM   Modules accepted: Orders

## 2022-05-20 ENCOUNTER — Telehealth: Payer: Self-pay | Admitting: Gynecologic Oncology

## 2022-05-20 LAB — SURGICAL PATHOLOGY

## 2022-05-20 NOTE — Telephone Encounter (Signed)
Spoke with Michelle [name] about endometrial biopsy.  This showed a benign polyp.  Jeral Pinch MD Gynecologic Oncology

## 2022-05-23 ENCOUNTER — Other Ambulatory Visit: Payer: Self-pay | Admitting: Family Medicine

## 2022-05-24 ENCOUNTER — Encounter: Payer: Self-pay | Admitting: Family Medicine

## 2022-05-24 NOTE — Telephone Encounter (Signed)
error 

## 2022-05-24 NOTE — Telephone Encounter (Signed)
Caller States: -received a notice that Refill was denied -he understands that the denial was due to a request being sent too early. -he agrees that [facility] sent the medication refill too early.   No further follow up needed at this time.

## 2022-05-26 ENCOUNTER — Ambulatory Visit (HOSPITAL_COMMUNITY)
Admission: RE | Admit: 2022-05-26 | Discharge: 2022-05-26 | Disposition: A | Discharge status: Home / Self Care | Payer: Medicare HMO | Source: Ambulatory Visit | Attending: Gynecologic Oncology | Admitting: Gynecologic Oncology

## 2022-05-26 DIAGNOSIS — R9389 Abnormal findings on diagnostic imaging of other specified body structures: Secondary | ICD-10-CM | POA: Insufficient documentation

## 2022-05-26 DIAGNOSIS — N950 Postmenopausal bleeding: Secondary | ICD-10-CM | POA: Insufficient documentation

## 2022-05-26 DIAGNOSIS — R1909 Other intra-abdominal and pelvic swelling, mass and lump: Secondary | ICD-10-CM | POA: Diagnosis not present

## 2022-05-26 DIAGNOSIS — N9489 Other specified conditions associated with female genital organs and menstrual cycle: Secondary | ICD-10-CM | POA: Insufficient documentation

## 2022-05-26 DIAGNOSIS — N8500 Endometrial hyperplasia, unspecified: Secondary | ICD-10-CM | POA: Diagnosis not present

## 2022-05-27 ENCOUNTER — Telehealth: Payer: Self-pay | Admitting: Gynecologic Oncology

## 2022-05-27 NOTE — Telephone Encounter (Signed)
[facility], discussed ultrasound results.  Endometrium is quite thickened.  Based on biopsy, I suspect this is all related to polypoid tissue but without doing further work-up such as a D&C, I cannot rule out underlying hyperplasia or malignancy.  Also discussed left adnexal mass, complex, which has grown in size and now measures 15 cm.  She continues to be asymptomatic related to this.  Reviewed that for most patients, I would recommend surgery for diagnostic purposes given growing size of the cyst.  Patient has made it very clear to me on multiple occasions she is not interested in surgery.  [name] will talk with her about our conversation.  What I asked is that if her bleeding were to start again, that they let me know.  We could try some oral progesterone to see if we can at least decrease or stop the bleeding.  Jeral Pinch MD Gynecologic Oncology

## 2022-06-01 ENCOUNTER — Telehealth: Payer: Self-pay | Admitting: Family Medicine

## 2022-06-01 MED ORDER — AMLODIPINE BESYLATE 2.5 MG PO TABS: ORAL_TABLET | ORAL | 3 refills | Status: DC

## 2022-06-01 NOTE — Telephone Encounter (Signed)
Rx has been sent to [facility].

## 2022-06-01 NOTE — Telephone Encounter (Signed)
Caller states: -Patient reached out to them to request they have her amlodipine 2.5 mg be sent their to pharmacy instead of [facility] pharmacy from now on - They tried to request transfer from [place] but they never received a response  Caller requests:    LAST APPOINTMENT DATE: 03/22/22  NEXT APPOINTMENT DATE: 06/20/22  MEDICATION: amLODipine (NORVASC) 2.5 MG tablet   Is the patient out of medication? Yes  PHARMACY: [facility], [place], [place] [address] Phone: [phone]  Fax: [phone]

## 2022-06-02 ENCOUNTER — Other Ambulatory Visit: Payer: Self-pay | Admitting: Family Medicine

## 2022-06-02 NOTE — Telephone Encounter (Signed)
Rx refill request approved per Dr. Corey's orders. 

## 2022-06-03 ENCOUNTER — Telehealth: Payer: Self-pay | Admitting: Family Medicine

## 2022-06-03 NOTE — Telephone Encounter (Signed)
Caller is patient's son, [name].   Caller states: - Patient has been experiencing confusion x 2 days  - Yesterday she couldn't recognize her son  - Her BP has been unstable although she has had no symptoms other than confusion  BP as of 06/02/22-- 165/88 (Before meds) & 126/108 (2 hours after taking meds)  BP as of 06/03/22--157/81 (Before meds), 143/72 (2 hours after taking meds), 138/81 (1 hour after previous reading), & 155/78 (@ 2pm)

## 2022-06-03 NOTE — Telephone Encounter (Signed)
Patient Name: Michelle [name] Gender: Female DOB: 22-May-1933 Age: 88 Y 26 M 2 D Return Phone Number: [phone] (Primary) Address: City/ State/ Zip: [place]  [address] Client [place] [facility] [place] Client Site [place] [facility] Day Provider [name]- MD Contact Type Call Who Is Calling Patient / Member / Family / Caregiver Call Type Triage / Clinical Caller Name [name] Relationship To Patient Son Return Phone Number [phone] (Primary) Chief Complaint CONFUSION - new onset Reason for Call Symptomatic / Request for [place] states that his mother is having confusion, did not recognize her her son. BP is running high 126/108 Translation No Nurse Assessment Nurse: Eugenio Hoes, RN, Jenny Reichmann Date/Time (Eastern Time): 06/03/2022 3:14:45 PM Confirm and document reason for call. If symptomatic, describe symptoms. ---Caller states that his mother is having confusion. Caller states that blood pressure one hour ago was 155/78. Caller states she does not feel any headache or chest pain. Does the patient have any new or worsening symptoms? ---Yes Will a triage be completed? ---Yes Related visit to physician within the last 2 weeks? ---No Does the PT have any chronic conditions? (i.e. diabetes, asthma, this includes High risk factors for pregnancy, etc.) ---Yes List chronic conditions. ---dementia, COPD, Is this a behavioral health or substance abuse call? ---No Guidelines Guideline Title Affirmed Question Affirmed Notes Nurse Date/Time (Eastern Time) Dementia Symptoms and Questions New or worsening sleep problems Eugenio Hoes, RN, Jenny Reichmann 06/03/2022 3:17:29 PM Blood Pressure - High [1] Taking BP medications AND [2] feels is having Lynett Fish 06/03/2022 3:23:55 PM  Guidelines Guideline Title Affirmed Question Affirmed Notes Nurse Date/Time Eilene Ghazi Time) side effects  (e.g., impotence, cough, dizzy upon standing) Disp. Time Eilene Ghazi Time) Disposition Final User 06/03/2022 3:12:25 PM Send to Urgent Dow Adolph, Amy 06/03/2022 3:20:33 PM See PCP within [place], RN, [facility] 06/03/2022 3:26:30 PM SEE PCP WITHIN 3 DAYS Yes Eugenio Hoes RN, Jenny Reichmann Final Disposition 06/03/2022 3:26:30 PM SEE PCP WITHIN 3 DAYS Yes Eugenio Hoes, RN, Jenny Reichmann Caller Disagree/Comply Comply Caller Understands Yes PreDisposition Did not know what to do Care Advice Given Per Guideline SEE PCP WITHIN 2 WEEKS: * You need to be seen for this ongoing problem within the next 2 weeks. CARE ADVICE given per Dementia Symptoms and Questions (Adult) guideline. SEE PCP WITHIN 3 DAYS: * You need to be seen within 2 or 3 days. CALL BACK IF: * You become worse CARE ADVICE given per High Blood Pressure (Adult) guideline. Comments User: Baird Cancer, RN Date/Time Eilene Ghazi Time): 06/03/2022 3:23:34 PM 146/68 HR87 Referrals REFERRED TO PCP OFFICE

## 2022-06-06 NOTE — Telephone Encounter (Signed)
FYI

## 2022-06-06 NOTE — Telephone Encounter (Signed)
In person evaluation would be the most ideal-if unable to do so would recommend emergent evaluation if worsening symptoms and schedule for the first available time patient is able to physically come in the office

## 2022-06-07 NOTE — Telephone Encounter (Signed)
Pt's son states he can not bring her in anytime soon without brother's help who is having surgery. He states pt has improved & is back to her self. BP has returned to normal & she is sleeping at night again.

## 2022-06-07 NOTE — Telephone Encounter (Signed)
FYI

## 2022-06-07 NOTE — Telephone Encounter (Signed)
Thrilled she is better-we are available if needed

## 2022-06-15 ENCOUNTER — Other Ambulatory Visit: Payer: Self-pay | Admitting: Family Medicine

## 2022-06-20 ENCOUNTER — Ambulatory Visit (INDEPENDENT_AMBULATORY_CARE_PROVIDER_SITE_OTHER): Payer: Medicare HMO

## 2022-06-20 VITALS — Wt 184.0 lb

## 2022-06-20 DIAGNOSIS — Z0000 Encounter for general adult medical examination without abnormal findings: Secondary | ICD-10-CM

## 2022-06-20 NOTE — Progress Notes (Signed)
I connected with  Michelle [name] on 06/20/22 by a audio enabled telemedicine application and verified that I am speaking with the correct person using two identifiers.  Patient Location: Home  Provider Location: Office/Clinic  I discussed the limitations of evaluation and management by telemedicine. The patient expressed understanding and agreed to proceed.   Subjective:   Michelle [name] is a 88 y.o. female who presents for Medicare Annual (Subsequent) preventive examination.  Review of Systems     Cardiac Risk Factors include: advanced age (>25mn, >>77women);hypertension;diabetes mellitus;dyslipidemia;obesity (BMI >30kg/m2);sedentary lifestyle     Objective:    Today's Vitals   06/20/22 1416  Weight: 184 lb (83.5 kg)   Body mass index is 30.62 kg/m.     06/20/2022    2:26 PM 03/02/2022   10:35 AM 08/19/2021    1:04 PM 06/07/2021    2:47 PM 02/02/2021    2:38 PM 11/29/2020    8:07 PM 09/22/2020    2:08 PM  Advanced Directives  Does Patient Have a Medical Advance Directive? No No Yes No Yes Yes No  Type of Advance Directive   Living will;Out of facility DNR (pink MOST or yellow form)  [place]  Does patient want to make changes to medical advance directive?      No - Patient declined   Would patient like information on creating a medical advance directive? No - Patient declined   No - Patient declined       Current Medications (verified) Outpatient Encounter Medications as of 06/20/2022  Medication Sig   acetaminophen (TYLENOL) 650 MG CR tablet Take 1,300 mg by mouth 3 (three) times daily. Patient take 3 times a day.   albuterol (PROVENTIL) (2.5 MG/3ML) 0.083% nebulizer solution USE THREE MILLILITERS VIA NEBULIZATION BY MOUTH EVERY 6 HOURS AS NEEDED FOR WHEEZING OR SHORTNESS OF BREATH   albuterol (VENTOLIN HFA) 108 (90 Base) MCG/ACT inhaler INHALE 1 PUFF EVERY 4 HOURS AS NEEDED FOR WHEEZING, SHORTNESS OF BREATH (RESCUE INHALER IF ADVAIR NOT  WORKING)   Alcohol Swabs (DROPSAFE ALCOHOL PREP) 70 % PADS USE  FOR  TESTING THREE TIMES DAILY   amLODipine (NORVASC) 2.5 MG tablet Take 2.5 mg in the morning. If afternoon blood pressure >145/90 take second tablet of 2.5   aspirin 81 MG tablet Take 81 mg by mouth daily with breakfast.   Blood Glucose Calibration (ACCU-CHEK SMARTVIEW CONTROL) LIQD    blood glucose meter kit and supplies KIT Dispense based on patient and insurance preference. Use up to four times daily as directed. Dx:E11.9   calcium carbonate (OSCAL) 1500 (600 Ca) MG TABS tablet Take 600 mg of elemental calcium by mouth daily with breakfast.   carvedilol (COREG) 12.5 MG tablet TAKE 1 TABLET TWICE DAILY   cholecalciferol (VITAMIN D3) 25 MCG (1000 UNIT) tablet Take 1,000 Units by mouth daily with breakfast.   clopidogrel (PLAVIX) 75 MG tablet TAKE 1 TABLET EVERY DAY   diclofenac Sodium (VOLTAREN) 1 % GEL Apply 2 g topically 3 (three) times daily as needed (pain).   DROPLET PEN NEEDLES 31G X 8 MM MISC USE AS DIRECTED WITH LANTUS SOLOSTAR PEN   enalapril (VASOTEC) 20 MG tablet TAKE 1 TABLET TWICE DAILY   fenofibrate micronized (LOFIBRA) 134 MG capsule TAKE 1 CAPSULE EVERY DAY   ferrous sulfate 325 (65 FE) MG tablet Twice a week (Patient taking differently: Take 325 mg by mouth 2 (two) times a week.)   fluticasone-salmeterol (ADVAIR) 250-50 MCG/ACT AEPB  INHALE 1 PUFF TWICE DAILY   folic acid (FOLVITE) 259 MCG tablet Take 400 mcg by mouth daily with breakfast.   furosemide (LASIX) 20 MG tablet TAKE 1 TABLET EVERY DAY   hydrALAZINE (APRESOLINE) 25 MG tablet Take one 42m tablet daily in the afternoon. Take one 524mtablet in the morning and at bedtime.   hydrALAZINE (APRESOLINE) 50 MG tablet Take one 2552mablet daily in the afternoon. Take one 75m76mblet in the morning and at bedtime.   lamoTRIgine (LAMICTAL) 25 MG tablet Take 1 tablet twice a day   Lancets Misc. (ACCU-CHEK FASTCLIX LANCET) KIT 1 Device by Does not apply route. Use  as directed for testing blood sugar   Lancets Misc. (ACCU-CHEK FASTCLIX LANCET) KIT Use as directed to test blood sugar   LANTUS SOLOSTAR 100 UNIT/ML Solostar Pen INJECT  17  TO  20 UNITS SUBCUTANEOUSLY EVERY MORNING   loratadine (CLARITIN) 10 MG tablet Take 10 mg by mouth every other day.   Melatonin 5 MG CAPS Take 5 mg by mouth at bedtime.   metFORMIN (GLUCOPHAGE-XR) 500 MG 24 hr tablet TAKE ONE TABLET BY MOUTH IN THE MORNING AND AT BEDTIME   nitroGLYCERIN (NITROSTAT) 0.4 MG SL tablet DISSOLVE 1 TAB UNDER TONGUE FOR CHEST PAIN - IF PAIN REMAINS AFTER 5 MIN, CALL 911 AND REPEAT DOSE. MAX 3 TABS IN 15 MINUTES   nortriptyline (PAMELOR) 10 MG capsule TAKE 1 CAPSULE BY MOUTH AT BEDTIME FOR PAIN PREVENTION   pantoprazole (PROTONIX) 40 MG tablet TAKE 1 TABLET EVERY DAY   rosuvastatin (CRESTOR) 40 MG tablet TAKE 1 TABLET EVERY DAY   sertraline (ZOLOFT) 50 MG tablet TAKE 2 TABLETS AT BEDTIME   SPIRIVA HANDIHALER 18 MCG inhalation capsule PLACE 1 CAPSULE INTO HANDIHALER AND INHALE THE CONTENTS DAILY   spironolactone (ALDACTONE) 25 MG tablet TAKE 1 TABLET (25 MG TOTAL) BY MOUTH DAILY.   TRUE METRIX BLOOD GLUCOSE TEST test strip TEST BLOOD SUGAR  UP  TO FOUR TIMES DAILY AS DIRECTED   TRUEplus Lancets 33G MISC TEST BLOOD SUGAR UP TO FOUR TIMES DAILY AS DIRECTED   VASCEPA 1 g capsule TAKE 2 CAPSULES TWICE DAILY   vitamin B-12 (CYANOCOBALAMIN) 1000 MCG tablet Take 1,000 mcg by mouth daily with breakfast.   vitamin E 400 UNIT capsule Take 400 Units by mouth at bedtime.   No facility-administered encounter medications on file as of 06/20/2022.    Allergies (verified) Codeine sulfate and Morphine sulfate   History: Past Medical History:  Diagnosis Date   Anginal pain (HCC)    Arthritis    AV block, 1st degree    CAD (coronary artery disease) 1990; 2015   Cardiac cath 1990 with Dr. StucLia Foyer pt reports blockage in artery  with angioplasty. She has pictures that show severe stenosis mid RCA and a post  PTCA picture with 30% residual stenosis post PTCA. Residual CAD, non obstructive per 2015 cath. STEMI status post stent in August 2015.   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   Promus DES to RCA with STEMI   Shortness of breath    Past Surgical History:  Procedure Laterality Date   CATARACT EXTRACTION  2020  right and left eye    CHOLECYSTECTOMY     thinks her appendix was removed at the same time   [place]  02/18/14   Promus DES to RCA   [place] N/A 02/18/2014   Procedure: LEFT HEART CATHETERIZATION WITH CORONARY ANGIOGRAM;  Surgeon: Jettie Booze, MD;  Location: [facility] CATH LAB;  Service: Cardiovascular;  Laterality: N/A;   PTCA  1990   PTCA of RCA   TRANSTHORACIC ECHOCARDIOGRAM  02/02/2012   mild LVH, EF 55-60%, Normal WM, Gr 1 DD; Mild MR   Family History  Problem Relation Age of Onset   Heart attack Mother 81   Cancer Father 52   Cancer Maternal Grandfather    Cancer Paternal Grandmother    Cancer Paternal Grandfather    Diabetes Son    Liver cancer Brother    Bone cancer Brother    Heart attack Son    Hypertension Son    Diabetes Son    Hyperlipidemia Son    Colon cancer Neg Hx    Ovarian cancer Neg Hx    Uterine cancer Neg Hx    Social History   Socioeconomic History   Marital status: Single    Spouse name: Not on file   Number of children: 2   Years of education: Not on file   Highest education level: Not on file  Occupational History   Occupation: Retired    Fish farm manager: RETIRED  Tobacco Use   Smoking status: Former    Packs/day: 1.00    Years: 55.00    Total pack  years: 55.00    Types: Cigarettes    Quit date: 09/29/2006    Years since quitting: 15.7   Smokeless tobacco: Never  Vaping Use   Vaping Use: Never used  Substance and Sexual Activity   Alcohol use: No   Drug use: No   Sexual activity: Not Currently  Other Topics Concern   Not on file  Social History Narrative   Lives with her son - 2 very supportive sons   Right handed   Social Determinants of Health   Financial Resource Strain: Low Risk  (06/20/2022)   Overall Financial Resource Strain (CARDIA)    Difficulty of Paying Living Expenses: Not hard at all  Food Insecurity: No Food Insecurity (06/20/2022)   Hunger Vital Sign    Worried About Running Out of Food in the Last Year: Never true    Ran Out of Food in the Last Year: Never true  Transportation Needs: No Transportation Needs (06/20/2022)   PRAPARE - Hydrologist (Medical): No    Lack of Transportation (Non-Medical): No  Physical Activity: Inactive (06/20/2022)   Exercise Vital Sign    Days of Exercise per Week: 0 days    Minutes of Exercise per Session: 0 min  Stress: No Stress Concern Present (06/20/2022)   [place]    Feeling of Stress : Not at all  Social Connections: Socially Isolated (06/20/2022)   Social Connection and Isolation Panel [NHANES]    Frequency of Communication with Friends and Family: Once a week    Frequency of Social Gatherings with Friends and Family: Once a week    Attends Religious Services: Never    Marine scientist or Organizations: No    Attends Archivist Meetings: Never    Marital Status: Never married    Tobacco Counseling  Counseling given: Not Answered   Clinical Intake:  Pre-visit preparation completed: Yes  Pain : No/denies pain     BMI - recorded: 30.62 Nutritional Status: BMI > 30  Obese Nutritional Risks: None Diabetes: Yes CBG done?: Yes (111 per pt) CBG  resulted in Enter/ Edit results?: No Did pt. bring in CBG monitor from home?: No  How often do you need to have someone help you when you read instructions, pamphlets, or other written materials from your doctor or pharmacy?: 1 - Never  Diabetic?Nutrition Risk Assessment:  Has the patient had any N/V/D within the last 2 months?  Yes at times lose stool  Does the patient have any non-healing wounds?  No  Has the patient had any unintentional weight loss or weight gain?  No   Diabetes:  Is the patient diabetic?  Yes  If diabetic, was a CBG obtained today?  Yes  Did the patient bring in their glucometer from home?  No  How often do you monitor your CBG's? Daily .   Financial Strains and Diabetes Management:  Are you having any financial strains with the device, your supplies or your medication? No .  Does the patient want to be seen by Chronic Care Management for management of their diabetes?  No  Would the patient like to be referred to a Nutritionist or for Diabetic Management?  No   Diabetic Exams:  Diabetic Eye Exam: Completed 08/10/21 Diabetic Foot Exam: Completed 10/14/21   Interpreter Needed?: No  Information entered by :: Charlott Rakes, LPN   Activities of Daily Living    06/20/2022    2:29 PM  In your present state of health, do you have any difficulty performing the following activities:  Hearing? 0  Vision? 0  Difficulty concentrating or making decisions? 0  Walking or climbing stairs? 1  Comment uses rollator and ramp  Dressing or bathing? 1  Comment assistance as needed  Doing errands, shopping? 0  Preparing Food and eating ? N  Comment son assist  Using the Toilet? N  In the past six months, have you accidently leaked urine? N  Comment at times  Do you have problems with loss of bowel control? Y  Comment at times loose stools  Managing your Medications? N  Managing your Finances? N  Housekeeping or managing your Housekeeping? N    Patient Care  Team: [name], MD as PCP - General (Family Medicine) Burnell Blanks, MD as PCP - Cardiology (Cardiology) Karin Golden, MD as Consulting Physician (Dermatology) Bernarda Caffey, MD as Consulting Physician (Ophthalmology) [name], MD as Consulting Physician (Neurology) Elease Hashimoto (Neurology) Edythe Clarity, [facility] (Pharmacist) Skeet Latch, MD as Attending Physician (Cardiology)  Indicate any recent Medical Services you may have received from other than [facility] providers in the past year (date may be approximate).     Assessment:   This is a routine wellness examination for [place].  Hearing/Vision screen Hearing Screening - Comments:: Pt denies any hearing  Vision Screening - Comments:: Pt follows up with Dr Coralyn Pear for annual eye exams   Dietary issues and exercise activities discussed: Current Exercise Habits: The patient does not participate in regular exercise at present   Goals Addressed             This Visit's Progress    Patient Stated       None at this time        Depression Screen    06/20/2022  2:25 PM 06/07/2021    2:46 PM 03/12/2021    4:16 PM 01/14/2021    2:22 PM 10/22/2020    5:18 PM 09/04/2020    9:02 AM 05/25/2020    2:41 PM  PHQ 2/9 Scores  PHQ - 2 Score 0 0 0 0 0 3 0  PHQ- 9 Score     2 13     Fall Risk    06/20/2022    2:29 PM 08/19/2021    1:04 PM 06/07/2021    2:48 PM 03/12/2021    4:16 PM 02/02/2021    2:38 PM  Fall Risk   Falls in the past year? _0 0 1  Number falls in past yr: _1 0 1  Injury with Fall? 0 0 1 0 1  Comment   bruised    Risk for fall due to : Impaired mobility;Impaired balance/gait;Impaired vision  Impaired mobility;Impaired vision No Fall Risks   Follow up Falls prevention discussed  Falls prevention discussed      FALL RISK PREVENTION PERTAINING TO THE HOME:  Any stairs in or around the home? Yes  If so, are there any without handrails? No  Home free of loose throw rugs  in walkways, pet beds, electrical cords, etc? Yes  Adequate lighting in your home to reduce risk of falls? Yes   ASSISTIVE DEVICES UTILIZED TO PREVENT FALLS:  Life alert? No  Use of a cane, walker or w/c? Yes  Grab bars in the bathroom? No  Shower chair or bench in shower? Yes  Elevated toilet seat or a handicapped toilet? Yes   TIMED UP AND GO:  Was the test performed? No .   Cognitive Function:    02/02/2021    3:00 PM  MMSE - Mini Mental State Exam  Orientation to time 4  Orientation to Place 4  Registration 3  Attention/ Calculation 0  Recall 0  Language- name 2 objects 2  Language- repeat 1  Language- follow 3 step command 3  Language- read & follow direction 1  Write a sentence 1  Copy design 1  Total score 20      09/22/2020    2:00 PM  Montreal Cognitive Assessment   Visuospatial/ Executive (0/5) 1  Naming (0/3) 2  Attention: Read list of digits (0/2) 2  Attention: Read list of letters (0/1) 0  Attention: Serial 7 subtraction starting at 100 (0/3) 1  Language: Repeat phrase (0/2) 1  Language : Fluency (0/1) 0  Abstraction (0/2) 0  Delayed Recall (0/5) 0  Orientation (0/6) 6  Total 13  Adjusted Score (based on education) 14      06/20/2022    2:32 PM 06/07/2021    2:50 PM 05/25/2020    2:47 PM 05/20/2019   11:36 AM 05/16/2018    3:59 PM  6CIT Screen  What Year? 4 points 0 points 0 points 0 points 0 points  What month? 0 points 0 points 0 points 0 points 0 points  What time? 0 points 0 points  0 points 0 points  Count back from 20 0 points 0 points 0 points 0 points 0 points  Months in reverse 0 points 4 points 4 points 0 points 0 points  Repeat phrase 2 points 10 points 10 points 2 points 0 points  Total Score 6 points 14 points  2 points 0 points    Immunizations Immunization History  Administered Date(s) Administered   Fluad Quad(high Dose 65+) 08/17/2020  Influenza Split 04/21/2011, 05/16/2012   Influenza Whole 05/24/2007, 05/01/2009,  04/17/2010   Influenza, High Dose Seasonal PF 03/23/2018, 05/25/2019, 05/01/2021   Influenza,inj,Quad PF,6+ Mos 04/14/2013, 04/10/2014, 04/03/2015   Influenza-Unspecified 06/01/2016, 06/15/2017, 05/24/2020   Moderna Sars-Covid-2 Vaccination 10/25/2019, 11/22/2019, 10/03/2020, 08/09/2021   Pneumococcal Conjugate-13 09/22/2014, 05/25/2019   Pneumococcal Polysaccharide-23 04/18/2003   Tdap 07/29/2011   Zoster, Live 04/18/2011    TDAP status: Due, Education has been provided regarding the importance of this vaccine. Advised may receive this vaccine at local pharmacy or Health Dept. Aware to provide a copy of the vaccination record if obtained from local pharmacy or Health Dept. Verbalized acceptance and understanding.  Flu Vaccine status: Due, Education has been provided regarding the importance of this vaccine. Advised may receive this vaccine at local pharmacy or Health Dept. Aware to provide a copy of the vaccination record if obtained from local pharmacy or Health Dept. Verbalized acceptance and understanding.  Pneumococcal vaccine status: Up to date  Covid-19 vaccine status: Completed vaccines  Qualifies for Shingles Vaccine? Yes   Zostavax completed No   Shingrix Completed?: No.    Education has been provided regarding the importance of this vaccine. Patient has been advised to call insurance company to determine out of pocket expense if they have not yet received this vaccine. Advised may also receive vaccine at local pharmacy or Health Dept. Verbalized acceptance and understanding.  Screening Tests Health Maintenance  Topic Date Due   DTaP/Tdap/Td (2 - Td or Tdap) 07/28/2021   COVID-19 Vaccine (5 - 2023-24 season) 03/18/2022   Zoster Vaccines- Shingrix (1 of 2) 06/21/2022 (Originally 12/30/1951)   INFLUENZA VACCINE  10/16/2022 (Originally 02/15/2022)   DEXA SCAN  04/21/2043 (Originally 12/31/1999)   OPHTHALMOLOGY EXAM  08/10/2022   HEMOGLOBIN A1C  09/20/2022   FOOT EXAM  10/15/2022    Medicare Annual Wellness (AWV)  06/21/2023   Pneumonia Vaccine 7+ Years old  Completed   HPV VACCINES  Aged Out    Health Maintenance  Health Maintenance Due  Topic Date Due   DTaP/Tdap/Td (2 - Td or Tdap) 07/28/2021   COVID-19 Vaccine (5 - 2023-24 season) 03/18/2022    Colorectal cancer screening: No longer required.   Mammogram status: No longer required due to age.    Additional Screening:   Vision Screening: Recommended annual ophthalmology exams for early detection of glaucoma and other disorders of the eye. Is the patient up to date with their annual eye exam?  Yes  Who is the provider or what is the name of the office in which the patient attends annual eye exams? Dr Coralyn Pear  If pt is not established with a provider, would they like to be referred to a provider to establish care? No .   Dental Screening: Recommended annual dental exams for proper oral hygiene  Community Resource Referral / Chronic Care Management: CRR required this visit?  No   CCM required this visit?  No      Plan:     I have personally reviewed and noted the following in the patient's chart:   Medical and social history Use of alcohol, tobacco or illicit drugs  Current medications and supplements including opioid prescriptions. Patient is not currently taking opioid prescriptions. Functional ability and status Nutritional status Physical activity Advanced directives List of other physicians Hospitalizations, surgeries, and ER visits in previous 12 months Vitals Screenings to include cognitive, depression, and falls Referrals and appointments  In addition, I have reviewed and discussed with patient certain preventive protocols,  quality metrics, and best practice recommendations. A written personalized care plan for preventive services as well as general preventive health recommendations were provided to patient.     Willette Brace, LPN   06/24/7866   Nurse Notes: none

## 2022-06-20 NOTE — Patient Instructions (Signed)
Michelle [name] , Thank you for taking time to come for your Medicare Wellness Visit. I appreciate your ongoing commitment to your health goals. Please review the following plan we discussed and let me know if I can assist you in the future.   These are the goals we discussed:  Goals      Exercise 150 minutes per week (moderate activity)     Exercise in the chair Get up from the chair 3 times ; 3 times a day Walk in the home a couple times a day      Monitor and Manage My Blood Sugar-Diabetes Type 2     Timeframe:  Long-Range Goal Priority:  High Start Date: 03/30/21                            Expected End Date:09/27/20                       Follow Up Date 06/28/21   - check blood sugar at prescribed times - check blood sugar if I feel it is too high or too low - take the blood sugar log to all doctor visits    Why is this important?   Checking your blood sugar at home helps to keep it from getting very high or very low.  Writing the results in a diary or log helps the doctor know how to care for you.  Your blood sugar log should have the time, date and the results.  Also, write down the amount of insulin or other medicine that you take.  Other information, like what you ate, exercise done and how you were feeling, will also be helpful.     Notes:      Patient Stated     Exercise      Patient Stated     Get rid of some medications      Patient Stated     None at this time      Michelle [name] (see longitudinal plan of care for additional care plan information)  Current Barriers:  Chronic Disease Management support, education, and care coordination needs related to Hypertension, Hyperlipidemia, Diabetes, and COPD   Hypertension BP Readings from Last 3 Encounters:  02/18/20 130/70  11/21/19 140/62  07/23/19 130/78  Pharmacist Clinical Goal(s): Over the next 180 days, patient will work with PharmD and providers to maintain BP goal <130/80 Current  regimen:  Metoprolol tartrate 25 mg twice daily (acts on heart rate and lowers blood pressure) Lasix 40 mg daily (helps primarily with fluid retention - known as "water pill") Enalapril 20 mg once daily (lowers blood pressure and also can offer kidney protection in patients with diabetes) Interventions: Diet / exercise recommendations  Patient self care activities - Over the next 180 days, patient will: Check BP once every 1-2 weeks, document, and provide at future appointments Bring BP cuff to upcoming December physical to compare to office readings for accuracy Ensure daily salt intake < 2000 mg/day Focus on intake of white meats like skinless chicken, fish, ground [place] over red meats  Hyperlipidemia Lipid Panel     Component Value Date/Time   CHOL 128 02/18/2020 1529   TRIG 355 (H) 02/18/2020 1529   TRIG 1693 (HH) 07/28/2006 1202   HDL 49 (L) 02/18/2020 1529   CHOLHDL 2.6 02/18/2020 1529   VLDL UNABLE TO CALCULATE IF TRIGLYCERIDE OVER  400 mg/dL 02/19/2014 0228   LDLCALC 42 02/18/2020 1529   LDLDIRECT 40.0 07/23/2019 1445  Pharmacist Clinical Goal(s): Over the next 180 days, patient will work with PharmD and providers to maintain LDL goal < 70 Current regimen:  Fenofibrate 134 mg once daily (lowers triglycerides - noted as "TRIG" above) Simvastatin 40 mg once daily (effectively lowers LDL/"lousy" cholesterol, increases HDL/"Happy" cholesterol, also lowers triglycerides) (can prevent heart attacks and stroke) Interventions: Diet / exercise recommendations - see blood pressure section Patient self care activities - Over the next 180 days, patient will: Diet / exercise recommendations - see blood pressure section  Diabetes Lab Results  Component Value Date/Time   HGBA1C 6.6 (H) 02/18/2020 03:29 PM   HGBA1C 6.6 (H) 11/21/2019 03:42 PM  Pharmacist Clinical Goal(s): Over the next 180 days, patient will work with PharmD and providers to maintain A1c goal <7% Current regimen:   Lantus 17 to 20 units every morning Metformin 1000 mg twice daily Interventions: Reviewed low blood sugar and correction methods - Rule of 15 Recommend purchasing blood glucose tablets to have on hand - can purchase at most pharmacies Patient self care activities - Over the next 180 days, patient will: Check blood sugar once daily, document, and provide at future appointments Contact provider with any episodes of hypoglycemia (blood glucose <70)  COPD Pharmacist Clinical Goal(s) Over the next 180 days, patient will work with PharmD and providers to minimize symptoms of COPD and ensure medication safety Current regimen:  Spiriva Handihaler - inhale contents of capsule once daily (Maintenance - used daily to prevent symptoms of COPD) Advair 250-50 mcg/dose - one puff twice daily (Maintenance - used daily to prevent symptoms of COPD) Ventolin HFA - inhale 1 puff into the lungs every 4 hours as needed for wheezing or shortness of breath (Rescue - typically used to relieve active symptoms of COPD, if using routinely or multiple times per week, may indicate COPD is not well controlled) Interventions: Reviewed inhaler technique, counseled on each Reviewed instructions for twice daily advair Requested new advair rx Patient self care activities - Over the next 180 days, patient will: Ensure consistent use of maintenance inhalers - Spriva once daily, Advair twice daily Report any changes in breathing or frequent use of rescue inhaler - albuterol (Ventolin)  Medication management Pharmacist Clinical Goal(s): Over the next 180 days, patient will work with PharmD and providers to maintain optimal medication adherence Current pharmacy: [facility] Comprehensive medication review performed. Requested for all Rxs to be filled through [facility] moving forward Patient self care activities - Over the next 180 days, patient will: Take medications as prescribed Report any questions  or concerns to PharmD and/or provider(s) Initial goal documentation.        This is a list of the screening recommended for you and due dates:  Health Maintenance  Topic Date Due   DTaP/Tdap/Td vaccine (2 - Td or Tdap) 07/28/2021   COVID-19 Vaccine (5 - 2023-24 season) 03/18/2022   Zoster (Shingles) Vaccine (1 of 2) 06/21/2022*   Flu Shot  10/16/2022*   DEXA scan (bone density measurement)  04/21/2043*   Eye exam for diabetics  08/10/2022   Hemoglobin A1C  09/20/2022   Complete foot exam   10/15/2022   Medicare Annual Wellness Visit  06/21/2023   Pneumonia Vaccine  Completed   HPV Vaccine  Aged Out  *Topic was postponed. The date shown is not the original due date.    Advanced directives: Advance directive discussed with you  today. Even though you declined this today please call our office should you change your mind and we can give you the proper paperwork for you to fill out.  Conditions/risks identified: none at this time   Next appointment: Follow up in one year for your annual wellness visit    Preventive Care 65 Years and Older, Female Preventive care refers to lifestyle choices and visits with your health care provider that can promote health and wellness. What does preventive care include? A yearly physical exam. This is also called an annual well check. Dental exams once or twice a year. Routine eye exams. Ask your health care provider how often you should have your eyes checked. Personal lifestyle choices, including: Daily care of your teeth and gums. Regular physical activity. Eating a healthy diet. Avoiding tobacco and drug use. Limiting alcohol use. Practicing safe sex. Taking low-dose aspirin every day. Taking vitamin and mineral supplements as recommended by your health care provider. What happens during an annual well check? The services and screenings done by your health care provider during your annual well check will depend on your age, overall health,  lifestyle risk factors, and family history of disease. Counseling  Your health care provider may ask you questions about your: Alcohol use. Tobacco use. Drug use. Emotional well-being. Home and relationship well-being. Sexual activity. Eating habits. History of falls. Memory and ability to understand (cognition). Work and work Statistician. Reproductive health. Screening  You may have the following tests or measurements: Height, weight, and BMI. Blood pressure. Lipid and cholesterol levels. These may be checked every 5 years, or more frequently if you are over 59 years old. Skin check. Lung cancer screening. You may have this screening every year starting at age 51 if you have a 30-pack-year history of smoking and currently smoke or have quit within the past 15 years. Fecal occult blood test (FOBT) of the stool. You may have this test every year starting at age 67. Flexible sigmoidoscopy or colonoscopy. You may have a sigmoidoscopy every 5 years or a colonoscopy every 10 years starting at age 18. Hepatitis C blood test. Hepatitis B blood test. Sexually transmitted disease (STD) testing. Diabetes screening. This is done by checking your blood sugar (glucose) after you have not eaten for a while (fasting). You may have this done every 1-3 years. Bone density scan. This is done to screen for osteoporosis. You may have this done starting at age 60. Mammogram. This may be done every 1-2 years. Talk to your health care provider about how often you should have regular mammograms. Talk with your health care provider about your test results, treatment options, and if necessary, the need for more tests. Vaccines  Your health care provider may recommend certain vaccines, such as: Influenza vaccine. This is recommended every year. Tetanus, diphtheria, and acellular pertussis (Tdap, Td) vaccine. You may need a Td booster every 10 years. Zoster vaccine. You may need this after age  81. Pneumococcal 13-valent conjugate (PCV13) vaccine. One dose is recommended after age 37. Pneumococcal polysaccharide (PPSV23) vaccine. One dose is recommended after age 65. Talk to your health care provider about which screenings and vaccines you need and how often you need them. This information is not intended to replace advice given to you by your health care provider. Make sure you discuss any questions you have with your health care provider. Document Released: 07/31/2015 Document Revised: 03/23/2016 Document Reviewed: 05/05/2015 Elsevier Interactive Patient Education  2017 [place] Prevention in the  Home Falls can cause injuries. They can happen to people of all ages. There are many things you can do to make your home safe and to help prevent falls. What can I do on the outside of my home? Regularly fix the edges of walkways and driveways and fix any cracks. Remove anything that might make you trip as you walk through a door, such as a raised step or threshold. Trim any bushes or trees on the path to your home. Use bright outdoor lighting. Clear any walking paths of anything that might make someone trip, such as rocks or tools. Regularly check to see if handrails are loose or broken. Make sure that both sides of any steps have handrails. Any raised decks and porches should have guardrails on the edges. Have any leaves, snow, or ice cleared regularly. Use sand or salt on walking paths during winter. Clean up any spills in your garage right away. This includes oil or grease spills. What can I do in the bathroom? Use night lights. Install grab bars by the toilet and in the tub and shower. Do not use towel bars as grab bars. Use non-skid mats or decals in the tub or shower. If you need to sit down in the shower, use a plastic, non-slip stool. Keep the floor dry. Clean up any water that spills on the floor as soon as it happens. Remove soap buildup in the tub or shower  regularly. Attach bath mats securely with double-sided non-slip rug tape. Do not have throw rugs and other things on the floor that can make you trip. What can I do in the bedroom? Use night lights. Make sure that you have a light by your bed that is easy to reach. Do not use any sheets or blankets that are too big for your bed. They should not hang down onto the floor. Have a firm chair that has side arms. You can use this for support while you get dressed. Do not have throw rugs and other things on the floor that can make you trip. What can I do in the kitchen? Clean up any spills right away. Avoid walking on wet floors. Keep items that you use a lot in easy-to-reach places. If you need to reach something above you, use a strong step stool that has a grab bar. Keep electrical cords out of the way. Do not use floor polish or wax that makes floors slippery. If you must use wax, use non-skid floor wax. Do not have throw rugs and other things on the floor that can make you trip. What can I do with my stairs? Do not leave any items on the stairs. Make sure that there are handrails on both sides of the stairs and use them. Fix handrails that are broken or loose. Make sure that handrails are as long as the stairways. Check any carpeting to make sure that it is firmly attached to the stairs. Fix any carpet that is loose or worn. Avoid having throw rugs at the top or bottom of the stairs. If you do have throw rugs, attach them to the floor with carpet tape. Make sure that you have a light switch at the top of the stairs and the bottom of the stairs. If you do not have them, ask someone to add them for you. What else can I do to help prevent falls? Wear shoes that: Do not have high heels. Have rubber bottoms. Are comfortable and fit you well. Are closed at  the toe. Do not wear sandals. If you use a stepladder: Make sure that it is fully opened. Do not climb a closed stepladder. Make sure that  both sides of the stepladder are locked into place. Ask someone to hold it for you, if possible. Clearly mark and make sure that you can see: Any grab bars or handrails. First and last steps. Where the edge of each step is. Use tools that help you move around (mobility aids) if they are needed. These include: Canes. Walkers. Scooters. Crutches. Turn on the lights when you go into a dark area. Replace any light bulbs as soon as they burn out. Set up your furniture so you have a clear path. Avoid moving your furniture around. If any of your floors are uneven, fix them. If there are any pets around you, be aware of where they are. Review your medicines with your doctor. Some medicines can make you feel dizzy. This can increase your chance of falling. Ask your doctor what other things that you can do to help prevent falls. This information is not intended to replace advice given to you by your health care provider. Make sure you discuss any questions you have with your health care provider. Document Released: 04/30/2009 Document Revised: 12/10/2015 Document Reviewed: 08/08/2014 Elsevier Interactive Patient Education  2017 [employer].

## 2022-06-21 ENCOUNTER — Telehealth: Payer: Self-pay | Admitting: Pharmacist

## 2022-06-21 NOTE — Progress Notes (Signed)
Chronic Care Management Pharmacy Assistant   Name: Michelle [name]  MRN: [id number] DOB: 07-11-1933   Reason for Encounter: Diabetes Adherence Call    Recent office visits:  None  Recent consult visits:  05/17/2022 OV (Gyn Onc) [name], MD; no medication changes indicated.  05/02/2022 OV (Cardiology) Loel Dubonnet, NP; Hold afternoon Hydralazine if systolic blood pressure (top number) less than 110. Please take Amlodipine in the evening if your blood pressure is more than 145/90.  Hospital visits:  None in previous 6 months  Medications: Outpatient Encounter Medications as of 06/21/2022  Medication Sig   acetaminophen (TYLENOL) 650 MG CR tablet Take 1,300 mg by mouth 3 (three) times daily. Patient take 3 times a day.   albuterol (PROVENTIL) (2.5 MG/3ML) 0.083% nebulizer solution USE THREE MILLILITERS VIA NEBULIZATION BY MOUTH EVERY 6 HOURS AS NEEDED FOR WHEEZING OR SHORTNESS OF BREATH   albuterol (VENTOLIN HFA) 108 (90 Base) MCG/ACT inhaler INHALE 1 PUFF EVERY 4 HOURS AS NEEDED FOR WHEEZING, SHORTNESS OF BREATH (RESCUE INHALER IF ADVAIR NOT WORKING)   Alcohol Swabs (DROPSAFE ALCOHOL PREP) 70 % PADS USE  FOR  TESTING THREE TIMES DAILY   amLODipine (NORVASC) 2.5 MG tablet Take 2.5 mg in the morning. If afternoon blood pressure >145/90 take second tablet of 2.5   aspirin 81 MG tablet Take 81 mg by mouth daily with breakfast.   Blood Glucose Calibration (ACCU-CHEK SMARTVIEW CONTROL) LIQD    blood glucose meter kit and supplies KIT Dispense based on patient and insurance preference. Use up to four times daily as directed. Dx:E11.9   calcium carbonate (OSCAL) 1500 (600 Ca) MG TABS tablet Take 600 mg of elemental calcium by mouth daily with breakfast.   carvedilol (COREG) 12.5 MG tablet TAKE 1 TABLET TWICE DAILY   cholecalciferol (VITAMIN D3) 25 MCG (1000 UNIT) tablet Take 1,000 Units by mouth daily with breakfast.   clopidogrel (PLAVIX) 75 MG tablet TAKE 1 TABLET EVERY  DAY   diclofenac Sodium (VOLTAREN) 1 % GEL Apply 2 g topically 3 (three) times daily as needed (pain).   DROPLET PEN NEEDLES 31G X 8 MM MISC USE AS DIRECTED WITH LANTUS SOLOSTAR PEN   enalapril (VASOTEC) 20 MG tablet TAKE 1 TABLET TWICE DAILY   fenofibrate micronized (LOFIBRA) 134 MG capsule TAKE 1 CAPSULE EVERY DAY   ferrous sulfate 325 (65 FE) MG tablet Twice a week (Patient taking differently: Take 325 mg by mouth 2 (two) times a week.)   fluticasone-salmeterol (ADVAIR) 250-50 MCG/ACT AEPB INHALE 1 PUFF TWICE DAILY   folic acid (FOLVITE) 537 MCG tablet Take 400 mcg by mouth daily with breakfast.   furosemide (LASIX) 20 MG tablet TAKE 1 TABLET EVERY DAY   hydrALAZINE (APRESOLINE) 25 MG tablet Take one 64m tablet daily in the afternoon. Take one 59mtablet in the morning and at bedtime.   hydrALAZINE (APRESOLINE) 50 MG tablet Take one 2589mablet daily in the afternoon. Take one 3m79mblet in the morning and at bedtime.   lamoTRIgine (LAMICTAL) 25 MG tablet Take 1 tablet twice a day   Lancets Misc. (ACCU-CHEK FASTCLIX LANCET) KIT 1 Device by Does not apply route. Use as directed for testing blood sugar   Lancets Misc. (ACCU-CHEK FASTCLIX LANCET) KIT Use as directed to test blood sugar   LANTUS SOLOSTAR 100 UNIT/ML Solostar Pen INJECT  17  TO  20 UNITS SUBCUTANEOUSLY EVERY MORNING   loratadine (CLARITIN) 10 MG tablet Take 10 mg by mouth every other day.  Melatonin 5 MG CAPS Take 5 mg by mouth at bedtime.   metFORMIN (GLUCOPHAGE-XR) 500 MG 24 hr tablet TAKE ONE TABLET BY MOUTH IN THE MORNING AND AT BEDTIME   nitroGLYCERIN (NITROSTAT) 0.4 MG SL tablet DISSOLVE 1 TAB UNDER TONGUE FOR CHEST PAIN - IF PAIN REMAINS AFTER 5 MIN, CALL 911 AND REPEAT DOSE. MAX 3 TABS IN 15 MINUTES   nortriptyline (PAMELOR) 10 MG capsule TAKE 1 CAPSULE BY MOUTH AT BEDTIME FOR PAIN PREVENTION   pantoprazole (PROTONIX) 40 MG tablet TAKE 1 TABLET EVERY DAY   rosuvastatin (CRESTOR) 40 MG tablet TAKE 1 TABLET EVERY DAY    sertraline (ZOLOFT) 50 MG tablet TAKE 2 TABLETS AT BEDTIME   SPIRIVA HANDIHALER 18 MCG inhalation capsule PLACE 1 CAPSULE INTO HANDIHALER AND INHALE THE CONTENTS DAILY   spironolactone (ALDACTONE) 25 MG tablet TAKE 1 TABLET (25 MG TOTAL) BY MOUTH DAILY.   TRUE METRIX BLOOD GLUCOSE TEST test strip TEST BLOOD SUGAR  UP  TO FOUR TIMES DAILY AS DIRECTED   TRUEplus Lancets 33G MISC TEST BLOOD SUGAR UP TO FOUR TIMES DAILY AS DIRECTED   VASCEPA 1 g capsule TAKE 2 CAPSULES TWICE DAILY   vitamin B-12 (CYANOCOBALAMIN) 1000 MCG tablet Take 1,000 mcg by mouth daily with breakfast.   vitamin E 400 UNIT capsule Take 400 Units by mouth at bedtime.   No facility-administered encounter medications on file as of 06/21/2022.   Recent Relevant Labs: Lab Results  Component Value Date/Time   HGBA1C 6.6 (H) 03/22/2022 03:15 PM   HGBA1C 6.8 (H) 10/14/2021 04:34 PM   MICROALBUR 0.7 01/07/2014 09:43 AM   MICROALBUR 0.2 07/08/2013 03:12 PM    Kidney Function Lab Results  Component Value Date/Time   CREATININE 1.18 03/22/2022 03:15 PM   CREATININE 0.94 03/02/2022 10:52 AM   CREATININE 0.98 (H) 07/03/2020 09:38 AM   CREATININE 0.81 07/01/2020 09:27 AM   GFR 41.61 (L) 03/22/2022 03:15 PM   GFRNONAA 59 (L) 03/02/2022 10:52 AM   GFRNONAA 66 07/01/2020 09:27 AM   GFRAA 77 07/01/2020 09:27 AM    Current antihyperglycemic regimen:  Metformin 500 mg twice daily Lantus 17 to 20 units - patient using 20 units daily per patient son  What recent interventions/DTPs have been made to improve glycemic control:  No recent interventions or DTPs.  Have there been any recent hospitalizations or ED visits since last visit with CPP? No  Patient denies hypoglycemic symptoms.  Patient denies hyperglycemic symptoms.  How often are you checking your blood sugar? twice daily  What are your blood sugars ranging?  Fasting: 120's After meals: 120's  During the week, how often does your blood glucose drop below 70?  Never  Are you checking your feet daily/regularly?   Adherence Review: Is the patient currently on a STATIN medication? Yes Is the patient currently on ACE/ARB medication? Yes Does the patient have >5 day gap between last estimated fill dates? No  Patient states the patient has been taking Amlodipine almost every evening and has not had to hold hydralazine due to higher blood pressure readings in the evenings.  Recent morning reading today (06/22/2022) 117/66  Care Gaps: Medicare Annual Wellness: Completed 06/20/2022 Ophthalmology Exam: Next due on 08/10/2022 Foot Exam: Next due on 10/15/2022 Hemoglobin A1C: 6.6% on 03/22/2022  Future Appointments  Date Time Provider [place]  07/21/2022  9:00 AM [name], MD [facility]  08/08/2022  1:00 PM Bernarda Caffey, MD [facility] None  09/06/2022  3:00 PM Skeet Latch, MD  [facility]  11/01/2022  2:00 PM [facility] CCM PHARMACIST [facility]  06/26/2023  2:30 PM [facility] HEALTH COACH [facility]   Star Rating Drugs: Enalapril 20 mg last filled 06/12/2022 90 DS Lantus last filled 05/03/2022 90 DS Metformin 500 mg last filled 05/03/2022 90 DS Rosuvastatin 40 mg last filled 05/23/2022 90 DS  April D Calhoun, [place] Pharmacist Assistant [phone]

## 2022-06-27 ENCOUNTER — Other Ambulatory Visit: Payer: Self-pay | Admitting: Neurology

## 2022-06-29 ENCOUNTER — Other Ambulatory Visit: Payer: Self-pay | Admitting: Family Medicine

## 2022-06-29 ENCOUNTER — Other Ambulatory Visit: Payer: Self-pay

## 2022-06-29 MED ORDER — AMLODIPINE BESYLATE 2.5 MG PO TABS: ORAL_TABLET | ORAL | 3 refills | Status: DC

## 2022-07-12 ENCOUNTER — Other Ambulatory Visit: Payer: Self-pay | Admitting: Family Medicine

## 2022-07-21 ENCOUNTER — Ambulatory Visit: Payer: Medicare HMO | Admitting: Family Medicine

## 2022-07-25 ENCOUNTER — Other Ambulatory Visit: Payer: Self-pay | Admitting: Neurology

## 2022-07-27 ENCOUNTER — Telehealth: Payer: Self-pay | Admitting: Family Medicine

## 2022-07-27 ENCOUNTER — Telehealth: Payer: Self-pay | Admitting: Cardiovascular Disease

## 2022-07-27 ENCOUNTER — Telehealth: Payer: Self-pay

## 2022-07-27 NOTE — Telephone Encounter (Signed)
Michelle [name] called in stating that Michelle [name] has been having some confusion such as thinking she was late to work when she doesn't work. Has told her son that she has company coming over and Michelle [name] says she lists off people who are no longer living. Next available appointment isnt until August and son says these issues have been worsening over the last couple days.

## 2022-07-27 NOTE — Telephone Encounter (Signed)
She can increase hydralazine to '50mg'$  three times a day. Also recommend he reach out to her neurologist regarding confusion and memory issues

## 2022-07-27 NOTE — Telephone Encounter (Signed)
Michelle [name], ok per DPR Verbalized understanding

## 2022-07-27 NOTE — Telephone Encounter (Signed)
Pt c/o BP issue: STAT if pt c/o blurred vision, one-sided weakness or slurred speech  1. What are your last 5 BP readings?   1/7: morning: 167/80        2 hrs after meds: 120/65         Afternoon: 137/69        Bedtime: 156/75   1/8: morning:184/98         2hr after meds: 150/75         Afternoon: 146/70         Bedtime: 179/89   1/9: morning:171/88          2 hrs after meds: 150/82          Afternoon: 157/86           Bedtime:  147/74  1/10: 185/96 this morning before medication               2. Are you having any other symptoms (ex. Dizziness, headache, blurred vision, passed out)? Pt son states she is back in a confused state thinking it is 3-4 years ago.  He denies any other symptoms. He states he gives her some Pedialyte sometimes she drinks it sometimes she doesn't   3. What is your BP issue? Pt son stated pt bp has been high and wanted to see what Dr. Oval Linsey wants pt to do.

## 2022-07-27 NOTE — Telephone Encounter (Signed)
[name] [name] request to be called at ph# [phone] regarding the following:  Caller states Patient has become extremely confused over the last 2-3 days. States he spoke with Cardiologist about this and blood pressure who told [name] to contact Neurologist and Dr. Yong Channel for possible UTI.  States Patient will not see anyone besides Dr. Yong Channel (no available appointment until 08/04/22). [name] would like to know if Patient can come to office to do Urinalysis to be checked for UTI.  Patient is scheduled to see Dr. Yong Channel on 08/12/22.  [name] would like to advised on the above and if Dr. Yong Channel feels waiting for the 08/12/22 would be too long to do Urinalysis.  [name] further states transportation is an issue which requires advance planning for appointments (states not sure he can bring in patient for 08/04/22 slot without getting help/transportation)

## 2022-07-28 ENCOUNTER — Telehealth: Payer: Self-pay | Admitting: Pharmacist

## 2022-07-28 ENCOUNTER — Encounter: Payer: Self-pay | Admitting: Family Medicine

## 2022-07-28 NOTE — Telephone Encounter (Signed)
FYI

## 2022-07-28 NOTE — Telephone Encounter (Signed)
See below

## 2022-07-28 NOTE — Telephone Encounter (Signed)
Thankful she has improved

## 2022-07-28 NOTE — Progress Notes (Signed)
Care Management & Coordination Services Pharmacy Team  Reason for Encounter: Diabetes  Contacted patient on 07/28/2022 to discuss diabetes disease state.   Recent office visits:  None  Recent consult visits:  None  Hospital visits:  None in previous 6 months  Medications: Outpatient Encounter Medications as of 07/28/2022  Medication Sig   acetaminophen (TYLENOL) 650 MG CR tablet Take 1,300 mg by mouth 3 (three) times daily. Patient take 3 times a day.   albuterol (PROVENTIL) (2.5 MG/3ML) 0.083% nebulizer solution USE THREE MILLILITERS VIA NEBULIZATION BY MOUTH EVERY 6 HOURS AS NEEDED FOR WHEEZING OR SHORTNESS OF BREATH   albuterol (VENTOLIN HFA) 108 (90 Base) MCG/ACT inhaler INHALE 1 PUFF EVERY 4 HOURS AS NEEDED FOR WHEEZING, SHORTNESS OF BREATH (RESCUE INHALER IF ADVAIR NOT WORKING)   Alcohol Swabs (DROPSAFE ALCOHOL PREP) 70 % PADS USE  FOR  TESTING THREE TIMES DAILY   amLODipine (NORVASC) 2.5 MG tablet Take 2.5 mg in the morning. If afternoon blood pressure >145/90 take second tablet of 2.5   aspirin 81 MG tablet Take 81 mg by mouth daily with breakfast.   Blood Glucose Calibration (ACCU-CHEK SMARTVIEW CONTROL) LIQD    blood glucose meter kit and supplies KIT Dispense based on patient and insurance preference. Use up to four times daily as directed. Dx:E11.9   calcium carbonate (OSCAL) 1500 (600 Ca) MG TABS tablet Take 600 mg of elemental calcium by mouth daily with breakfast.   carvedilol (COREG) 12.5 MG tablet TAKE 1 TABLET TWICE DAILY   cholecalciferol (VITAMIN D3) 25 MCG (1000 UNIT) tablet Take 1,000 Units by mouth daily with breakfast.   clopidogrel (PLAVIX) 75 MG tablet TAKE 1 TABLET EVERY DAY   diclofenac Sodium (VOLTAREN) 1 % GEL Apply 2 g topically 3 (three) times daily as needed (pain).   DROPLET PEN NEEDLES 31G X 8 MM MISC USE AS DIRECTED WITH LANTUS SOLOSTAR PEN   enalapril (VASOTEC) 20 MG tablet TAKE 1 TABLET TWICE DAILY   fenofibrate micronized (LOFIBRA) 134 MG capsule  TAKE 1 CAPSULE EVERY DAY   ferrous sulfate 325 (65 FE) MG tablet Twice a week (Patient taking differently: Take 325 mg by mouth 2 (two) times a week.)   fluticasone-salmeterol (ADVAIR) 250-50 MCG/ACT AEPB INHALE 1 PUFF TWICE DAILY   folic acid (FOLVITE) 517 MCG tablet Take 400 mcg by mouth daily with breakfast.   furosemide (LASIX) 20 MG tablet TAKE 1 TABLET EVERY DAY   hydrALAZINE (APRESOLINE) 50 MG tablet Take 50 mg by mouth 3 (three) times daily.   lamoTRIgine (LAMICTAL) 25 MG tablet TAKE 1 TABLET TWICE DAILY   Lancets Misc. (ACCU-CHEK FASTCLIX LANCET) KIT 1 Device by Does not apply route. Use as directed for testing blood sugar   Lancets Misc. (ACCU-CHEK FASTCLIX LANCET) KIT Use as directed to test blood sugar   LANTUS SOLOSTAR 100 UNIT/ML Solostar Pen INJECT  17  TO  20 UNITS SUBCUTANEOUSLY EVERY MORNING   loratadine (CLARITIN) 10 MG tablet Take 10 mg by mouth every other day.   Melatonin 5 MG CAPS Take 5 mg by mouth at bedtime.   metFORMIN (GLUCOPHAGE-XR) 500 MG 24 hr tablet TAKE ONE TABLET BY MOUTH IN THE MORNING AND AT BEDTIME   nitroGLYCERIN (NITROSTAT) 0.4 MG SL tablet DISSOLVE 1 TAB UNDER TONGUE FOR CHEST PAIN - IF PAIN REMAINS AFTER 5 MIN, CALL 911 AND REPEAT DOSE. MAX 3 TABS IN 15 MINUTES   nortriptyline (PAMELOR) 10 MG capsule TAKE 1 CAPSULE BY MOUTH AT BEDTIME FOR PAIN PREVENTION  pantoprazole (PROTONIX) 40 MG tablet TAKE 1 TABLET EVERY DAY   rosuvastatin (CRESTOR) 40 MG tablet TAKE 1 TABLET EVERY DAY   sertraline (ZOLOFT) 50 MG tablet TAKE 2 TABLETS AT BEDTIME   SPIRIVA HANDIHALER 18 MCG inhalation capsule PLACE 1 CAPSULE INTO HANDIHALER AND INHALE THE CONTENTS DAILY   spironolactone (ALDACTONE) 25 MG tablet TAKE 1 TABLET (25 MG TOTAL) BY MOUTH DAILY.   TRUE METRIX BLOOD GLUCOSE TEST test strip TEST BLOOD SUGAR UP TO FOUR TIMES DAILY AS DIRECTED   TRUEplus Lancets 33G MISC TEST BLOOD SUGAR UP TO FOUR TIMES DAILY AS DIRECTED   VASCEPA 1 g capsule TAKE 2 CAPSULES TWICE DAILY    vitamin B-12 (CYANOCOBALAMIN) 1000 MCG tablet Take 1,000 mcg by mouth daily with breakfast.   vitamin E 400 UNIT capsule Take 400 Units by mouth at bedtime.   No facility-administered encounter medications on file as of 07/28/2022.    Recent Relevant Labs: Lab Results  Component Value Date/Time   HGBA1C 6.6 (H) 03/22/2022 03:15 PM   HGBA1C 6.8 (H) 10/14/2021 04:34 PM   MICROALBUR 0.7 01/07/2014 09:43 AM   MICROALBUR 0.2 07/08/2013 03:12 PM    Kidney Function Lab Results  Component Value Date/Time   CREATININE 1.18 03/22/2022 03:15 PM   CREATININE 0.94 03/02/2022 10:52 AM   CREATININE 0.98 (H) 07/03/2020 09:38 AM   CREATININE 0.81 07/01/2020 09:27 AM   GFR 41.61 (L) 03/22/2022 03:15 PM   GFRNONAA 59 (L) 03/02/2022 10:52 AM   GFRNONAA 66 07/01/2020 09:27 AM   GFRAA 77 07/01/2020 09:27 AM    Current antihyperglycemic regimen:  Lantus 17 to 20 units every morning Metformin 500 mg twice daily   Patient verbally confirms she is taking the above medications as directed. Yes  What diet changes have been made to improve diabetes control? No recent diet changes.  What recent interventions/DTPs have been made to improve glycemic control:  No recent interventions or DTPs.  Have there been any recent hospitalizations or ED visits since last visit with PharmD? No  Patient denies hypoglycemic symptoms.  Patient denies hyperglycemic symptoms.  How often are you checking your blood sugar? twice daily  What are your blood sugars ranging?  Fasting: 120's After meals: 120's  During the week, how often does your blood glucose drop below 70? Never  Are you checking your feet daily/regularly? Yes  Adherence Review: Is the patient currently on a STATIN medication? Yes Is the patient currently on ACE/ARB medication? Yes Does the patient have >5 day gap between last estimated fill dates? No  Star Rating Drugs:  Enalapril 20 mg last filled 06/12/2022 90 DS Lantus last filled  07/19/2022 90 DS Metformin ER 500 mg last filled 07/19/2022 90 DS Rosuvastatin 40 mg last filled 05/23/2022 90 DS   Care Gaps: Annual wellness visit in last year? Yes Last eye exam / retinopathy screening: 08/09/2021 Last diabetic foot exam: 10/14/2021   Future Appointments  Date Time Provider [place]  08/01/2022  1:00 PM Gregor Hams, MD [facility] None  08/08/2022  1:00 PM Bernarda Caffey, MD [facility] None  08/12/2022  9:20 AM [name], MD [facility]  09/06/2022  3:00 PM Skeet Latch, MD [facility]  11/01/2022  2:00 PM [facility] PHARMACIST [facility]  06/26/2023  2:30 PM [facility] [place]   April D Calhoun, [place] Pharmacist Assistant [phone]

## 2022-07-28 NOTE — Telephone Encounter (Signed)
See below, ok to work pt in sooner?

## 2022-07-28 NOTE — Telephone Encounter (Signed)
Advised to get into contact with PCP, note he did awaiting results.

## 2022-07-28 NOTE — Telephone Encounter (Signed)
Informed randy of PCP message.   Caller states: - Patient's other son, his brother, bought an at home UTI test for patient and it came out negative  - Patient is acting more like herself and BP has improved  - He thinks they are okay with sticking to original appointment on 08/12/22.   Caller states he will have brother send a picture of test via mychart.

## 2022-07-28 NOTE — Telephone Encounter (Signed)
I would even be fine doing a virtual end of day today if they could be there with her- then we could set up UA and culture  - that's fastest I could do

## 2022-07-29 ENCOUNTER — Telehealth: Payer: Self-pay | Admitting: *Deleted

## 2022-07-29 NOTE — Telephone Encounter (Signed)
Bilateral Zilretta initiated through portal.

## 2022-07-30 ENCOUNTER — Other Ambulatory Visit: Payer: Self-pay | Admitting: Family Medicine

## 2022-08-01 ENCOUNTER — Ambulatory Visit: Payer: Medicare HMO | Admitting: Family Medicine

## 2022-08-01 ENCOUNTER — Ambulatory Visit: Payer: Self-pay

## 2022-08-01 VITALS — BP 136/70 | HR 76 | Ht 65.0 in

## 2022-08-01 DIAGNOSIS — G8929 Other chronic pain: Secondary | ICD-10-CM

## 2022-08-01 DIAGNOSIS — M25561 Pain in right knee: Secondary | ICD-10-CM | POA: Diagnosis not present

## 2022-08-01 DIAGNOSIS — M170 Bilateral primary osteoarthritis of knee: Secondary | ICD-10-CM | POA: Diagnosis not present

## 2022-08-01 DIAGNOSIS — M25562 Pain in left knee: Secondary | ICD-10-CM | POA: Diagnosis not present

## 2022-08-01 NOTE — Patient Instructions (Signed)
Thank you for coming in today.   You received an injection today. Seek immediate medical attention if the joint becomes red, extremely painful, or is oozing fluid.  

## 2022-08-01 NOTE — Telephone Encounter (Signed)
Rx refill request approved per Dr. Corey's orders. 

## 2022-08-01 NOTE — Progress Notes (Signed)
I, Michelle [name], LAT, ATC acting as a scribe for Michelle Leader, MD.  Michelle [name] is a 89 y.o. female who presents to [place] at [facility] today for cont'd chronic bilat knee pain. Pt was last [name] by Dr. Georgina [name] on 04/14/22 and was given bilat Zilretta injections. Today, pt reports she got about 2.5 months of benefit from prior Zilretta injections. Since then, bilat knee pain has returned and is very painful.    Dx imaging: 04/04/22 R & L knee XR             07/13/14 R knee XR  Pertinent review of systems: no fever or chills  Relevant historical information: cad, copd, diabetes   Exam:  BP 136/70   Pulse 76   Ht '5\' 5"'$  (1.651 m)   SpO2 94%   BMI 30.62 kg/m  General: Well Developed, well nourished, and in no acute distress.   MSK: Knees bilaterally moderate effusion decreased range of motion.  Decreased strength.    Lab and Radiology Results  Procedure: Real-time Ultrasound Guided Injection of right knee superior lateral patellar space Device: Philips Affiniti 50G Images permanently stored and available for review in PACS Verbal informed consent obtained.  Discussed risks and benefits of procedure. Warned about infection, bleeding, hyperglycemia damage to structures among others. Patient expresses understanding and agreement Time-out conducted.   Noted no overlying erythema, induration, or other signs of local infection.   Skin prepped in a sterile fashion.   Local anesthesia: Topical Ethyl chloride.   With sterile technique and under real time ultrasound guidance: 40 mg of Kenalog and 2 mg of Marcaine injected into knee joint. Fluid [name] entering the joint capsule.   Completed without difficulty   Pain immediately resolved suggesting accurate placement of the medication.   Advised to call if fevers/chills, erythema, induration, drainage, or persistent bleeding.   Images permanently stored and available for review in the ultrasound unit.  Impression:  Technically successful ultrasound guided injection.    Procedure: Real-time Ultrasound Guided Injection of left knee superior lateral patellar space Device: Philips Affiniti 50G Images permanently stored and available for review in PACS Verbal informed consent obtained.  Discussed risks and benefits of procedure. Warned about infection, bleeding, hyperglycemia damage to structures among others. Patient expresses understanding and agreement Time-out conducted.   Noted no overlying erythema, induration, or other signs of local infection.   Skin prepped in a sterile fashion.   Local anesthesia: Topical Ethyl chloride.   With sterile technique and under real time ultrasound guidance: 40 mg Kenalog and 2 mg Marcaine injected into knee joint. Fluid [name] entering the joint capsule.   Completed without difficulty   Pain immediately resolved suggesting accurate placement of the medication.   Advised to call if fevers/chills, erythema, induration, drainage, or persistent bleeding.   Images permanently stored and available for review in the ultrasound unit.  Impression: Technically successful ultrasound guided injection.         Assessment and Plan: 89 y.o. female with bilateral knee pain due to DJD.  Unfortunately Michelle [name] is not authorized at the time of this visit.  Authorization is still pending.  We discussed options.  Plan for conventional steroid injection while authorization is still in process.  Will call her once it is ready and she can reschedule once pain has returned.  Obviously Michelle [name] and her son are frustrated and I apologize that the authorization is still in process.   PDMP not reviewed this encounter. Orders Placed  This Encounter  Procedures   [place] LIMITED JOINT SPACE STRUCTURES LOW BILAT(NO LINKED CHARGES)    Order Specific Question:   Reason for Exam (SYMPTOM  OR DIAGNOSIS REQUIRED)    Answer:   bilateral knee pain    Order Specific Question:   Preferred imaging location?     Answer:   [facility]   No orders of the defined types were placed in this encounter.    Discussed warning signs or symptoms. Please see discharge instructions. Patient expresses understanding.   The above documentation has been reviewed and is accurate and complete Michelle [name], M.D.

## 2022-08-02 NOTE — Progress Notes (Shared)
[facility] Note  08/08/2022     CHIEF COMPLAINT Patient presents for No chief complaint on file.   HISTORY OF PRESENT ILLNESS: Michelle [name] is a 89 y.o. female who presents to the clinic today for:      Referring physician: [name], MD [place],  [place] [address]  HISTORICAL INFORMATION:   Selected notes from the MEDICAL RECORD NUMBER Referred by Dr. Yong Channel (PCP) for DM exam LEE:  Ocular Hx- PMH-   CURRENT MEDICATIONS: No current outpatient medications on file. (Ophthalmic Drugs)   No current facility-administered medications for this visit. (Ophthalmic Drugs)   Current Outpatient Medications (Other)  Medication Sig   acetaminophen (TYLENOL) 650 MG CR tablet Take 1,300 mg by mouth 3 (three) times daily. Patient take 3 times a day.   albuterol (PROVENTIL) (2.5 MG/3ML) 0.083% nebulizer solution USE THREE MILLILITERS VIA NEBULIZATION BY MOUTH EVERY 6 HOURS AS NEEDED FOR WHEEZING OR SHORTNESS OF BREATH   albuterol (VENTOLIN HFA) 108 (90 Base) MCG/ACT inhaler INHALE 1 PUFF EVERY 4 HOURS AS NEEDED FOR WHEEZING, SHORTNESS OF BREATH (RESCUE INHALER IF ADVAIR NOT WORKING)   Alcohol Swabs (DROPSAFE ALCOHOL PREP) 70 % PADS USE  FOR  TESTING THREE TIMES DAILY   amLODipine (NORVASC) 2.5 MG tablet Take 2.5 mg in the morning. If afternoon blood pressure >145/90 take second tablet of 2.5   aspirin 81 MG tablet Take 81 mg by mouth daily with breakfast.   Blood Glucose Calibration (ACCU-CHEK SMARTVIEW CONTROL) LIQD    blood glucose meter kit and supplies KIT Dispense based on patient and insurance preference. Use up to four times daily as directed. Dx:E11.9   calcium carbonate (OSCAL) 1500 (600 Ca) MG TABS tablet Take 600 mg of elemental calcium by mouth daily with breakfast.   carvedilol (COREG) 12.5 MG tablet TAKE 1 TABLET TWICE DAILY   cholecalciferol (VITAMIN D3) 25 MCG (1000 UNIT) tablet Take 1,000 Units by mouth daily with  breakfast.   clopidogrel (PLAVIX) 75 MG tablet TAKE 1 TABLET EVERY DAY   diclofenac Sodium (VOLTAREN) 1 % GEL Apply 2 g topically 3 (three) times daily as needed (pain).   DROPLET PEN NEEDLES 31G X 8 MM MISC USE AS DIRECTED WITH LANTUS SOLOSTAR PEN   enalapril (VASOTEC) 20 MG tablet TAKE 1 TABLET TWICE DAILY   fenofibrate micronized (LOFIBRA) 134 MG capsule TAKE 1 CAPSULE EVERY DAY   ferrous sulfate 325 (65 FE) MG tablet Twice a week (Patient taking differently: Take 325 mg by mouth 2 (two) times a week.)   fluticasone-salmeterol (ADVAIR) 250-50 MCG/ACT AEPB INHALE 1 PUFF TWICE DAILY   folic acid (FOLVITE) 034 MCG tablet Take 400 mcg by mouth daily with breakfast.   furosemide (LASIX) 20 MG tablet TAKE 1 TABLET EVERY DAY   hydrALAZINE (APRESOLINE) 50 MG tablet Take 50 mg by mouth 3 (three) times daily.   lamoTRIgine (LAMICTAL) 25 MG tablet TAKE 1 TABLET TWICE DAILY   Lancets Misc. (ACCU-CHEK FASTCLIX LANCET) KIT 1 Device by Does not apply route. Use as directed for testing blood sugar   Lancets Misc. (ACCU-CHEK FASTCLIX LANCET) KIT Use as directed to test blood sugar   LANTUS SOLOSTAR 100 UNIT/ML Solostar Pen INJECT  17  TO  20 UNITS SUBCUTANEOUSLY EVERY MORNING   loratadine (CLARITIN) 10 MG tablet Take 10 mg by mouth every other day.   Melatonin 5 MG CAPS Take 5 mg by mouth at bedtime.   metFORMIN (GLUCOPHAGE-XR) 500 MG  24 hr tablet TAKE ONE TABLET BY MOUTH IN THE MORNING AND AT BEDTIME   nitroGLYCERIN (NITROSTAT) 0.4 MG SL tablet DISSOLVE 1 TAB UNDER TONGUE FOR CHEST PAIN - IF PAIN REMAINS AFTER 5 MIN, CALL 911 AND REPEAT DOSE. MAX 3 TABS IN 15 MINUTES   nortriptyline (PAMELOR) 10 MG capsule TAKE 1 CAPSULE BY MOUTH AT BEDTIME FOR PAIN PREVENTION   pantoprazole (PROTONIX) 40 MG tablet TAKE 1 TABLET EVERY DAY   rosuvastatin (CRESTOR) 40 MG tablet TAKE 1 TABLET EVERY DAY   sertraline (ZOLOFT) 50 MG tablet TAKE 2 TABLETS AT BEDTIME   SPIRIVA HANDIHALER 18 MCG inhalation capsule PLACE 1 CAPSULE  INTO HANDIHALER AND INHALE THE CONTENTS DAILY   spironolactone (ALDACTONE) 25 MG tablet TAKE 1 TABLET (25 MG TOTAL) BY MOUTH DAILY.   TRUE METRIX BLOOD GLUCOSE TEST test strip TEST BLOOD SUGAR UP TO FOUR TIMES DAILY AS DIRECTED   TRUEplus Lancets 33G MISC TEST BLOOD SUGAR UP TO FOUR TIMES DAILY AS DIRECTED   VASCEPA 1 g capsule TAKE 2 CAPSULES TWICE DAILY   vitamin B-12 (CYANOCOBALAMIN) 1000 MCG tablet Take 1,000 mcg by mouth daily with breakfast.   vitamin E 400 UNIT capsule Take 400 Units by mouth at bedtime.   No current facility-administered medications for this visit. (Other)   REVIEW OF SYSTEMS:   ALLERGIES Allergies  Allergen Reactions   Codeine Sulfate Nausea Only   Morphine Sulfate Nausea Only   PAST MEDICAL HISTORY Past Medical History:  Diagnosis Date   Anginal pain (HCC)    Arthritis    AV block, 1st degree    CAD (coronary artery disease) 1990; 2015   Cardiac cath 1990 with Dr. Lia Foyer and [name] Residual CAD, non obstructive per 2015 cath. STEMI status post stent in August 2015.   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   Promus DES to RCA with STEMI   Shortness of breath    Past Surgical History:  Procedure Laterality Date   CATARACT EXTRACTION  2020   right and left eye    CHOLECYSTECTOMY     thinks her appendix was removed at the same time   [place]  02/18/14   Promus DES to RCA   [place] N/A 02/18/2014   Procedure: [place];  Surgeon: Jettie Booze, MD;  Location: [facility] CATH LAB;  Service: Cardiovascular;  Laterality: N/A;   PTCA  1990   PTCA of RCA   TRANSTHORACIC ECHOCARDIOGRAM  02/02/2012   mild LVH, EF 55-60%, Normal WM, Gr 1 DD; Mild MR   FAMILY HISTORY Family History  Problem Relation Age of Onset   Heart attack Mother 38   Cancer Father 11   Cancer Maternal Grandfather    Cancer Paternal Grandmother    Cancer Paternal Grandfather    Diabetes Son    Liver cancer Brother    Bone cancer Brother    Heart attack Son    Hypertension Son  Diabetes Son    Hyperlipidemia Son    Colon cancer Neg Hx    Ovarian cancer Neg Hx    Uterine cancer Neg Hx    SOCIAL HISTORY Social History   Tobacco Use   Smoking status: Former    Packs/day: 1.00    Years: 55.00    Total pack years: 55.00    Types: Cigarettes    Quit date: 09/29/2006    Years since quitting: 15.8   Smokeless tobacco: Never  Vaping Use   Vaping Use: Never used  Substance Use Topics   Alcohol use: No   Drug use: No       OPHTHALMIC EXAM:  Not recorded    IMAGING AND PROCEDURES  Imaging and Procedures for '@TODAY'$ @          ASSESSMENT/PLAN:  No diagnosis found.  1. Diabetes mellitus, type 2 without retinopathy  - pt is delayed to follow up from 1 year to 2+ years (10.16.20 to 01.23.23)  - The incidence, risk factors for progression, natural history and treatment options for diabetic retinopathy  were discussed with patient.    - The need for close monitoring of blood glucose, blood pressure, and serum lipids, avoiding cigarette or any type of tobacco, and the need for long term follow up was also discussed with patient.  - f/u in 1 year, sooner prn  2,3. Hypertensive retinopathy OU  - discussed importance of tight BP control  - monitor  4. Pseudophakia OU  - s/p CE/IOL OU -- pt unable to remember surgeon  - IOLs in good position  - monitor  5. 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    No follow-ups on file.  There are no Patient Instructions on file for this visit.   Explained the diagnoses, plan, and follow up with the patient and they expressed understanding.  Patient expressed understanding of the importance of proper follow up care.   This document serves as a record of services personally performed by Gardiner Sleeper, MD, PhD. It was created on their behalf by Orvan Falconer, an ophthalmic technician. The creation of this record is the provider's dictation and/or activities during the visit.    Electronically signed by: Orvan Falconer, OA, 08/02/22  10:44 AM   Gardiner Sleeper, M.D., Ph.D. Diseases & Surgery of the Retina and Vitreous [facility]: M myopia (nearsighted); A astigmatism; H hyperopia (farsighted); P presbyopia; Mrx spectacle prescription;  CTL contact lenses; OD right eye; OS left eye; OU both eyes  XT exotropia; ET esotropia; PEK punctate epithelial keratitis; PEE punctate epithelial erosions; DES dry eye syndrome; MGD meibomian gland dysfunction; ATs artificial tears; PFAT's preservative free artificial tears; [place] nuclear sclerotic cataract; PSC posterior subcapsular cataract; ERM epi-retinal membrane; PVD posterior vitreous detachment; RD retinal detachment; DM diabetes mellitus; DR diabetic retinopathy; NPDR non-proliferative diabetic retinopathy; PDR proliferative diabetic retinopathy; CSME clinically significant macular edema; DME diabetic macular edema; dbh dot blot hemorrhages; CWS cotton wool spot; POAG primary open angle glaucoma; C/D cup-to-disc ratio; HVF humphrey visual field; GVF goldmann visual field; OCT optical coherence tomography; IOP intraocular pressure;  BRVO Branch retinal vein occlusion; CRVO central retinal vein occlusion; CRAO central retinal artery occlusion; BRAO  branch retinal artery occlusion; RT retinal tear; SB scleral buckle; PPV pars plana vitrectomy; VH Vitreous hemorrhage; PRP panretinal laser photocoagulation; IVK intravitreal kenalog; VMT vitreomacular traction; MH Macular hole;  NVD neovascularization of the disc; NVE neovascularization elsewhere; AREDS age related eye disease study; ARMD age related macular degeneration; POAG primary open angle glaucoma; EBMD epithelial/anterior basement membrane dystrophy; ACIOL anterior chamber intraocular lens; IOL intraocular lens; PCIOL posterior chamber intraocular lens; Phaco/IOL phacoemulsification with intraocular lens placement; [place] photorefractive keratectomy; LASIK laser assisted in situ keratomileusis; HTN hypertension; DM diabetes mellitus; COPD chronic obstructive pulmonary disease

## 2022-08-04 NOTE — Telephone Encounter (Signed)
Still waiting on prior auth determination.

## 2022-08-08 ENCOUNTER — Other Ambulatory Visit: Payer: Self-pay | Admitting: Family Medicine

## 2022-08-08 ENCOUNTER — Encounter (INDEPENDENT_AMBULATORY_CARE_PROVIDER_SITE_OTHER): Payer: Medicare HMO | Admitting: Ophthalmology

## 2022-08-08 DIAGNOSIS — I10 Essential (primary) hypertension: Secondary | ICD-10-CM

## 2022-08-08 DIAGNOSIS — H35033 Hypertensive retinopathy, bilateral: Secondary | ICD-10-CM

## 2022-08-08 DIAGNOSIS — E119 Type 2 diabetes mellitus without complications: Secondary | ICD-10-CM

## 2022-08-08 DIAGNOSIS — H26493 Other secondary cataract, bilateral: Secondary | ICD-10-CM

## 2022-08-08 DIAGNOSIS — Z961 Presence of intraocular lens: Secondary | ICD-10-CM

## 2022-08-08 NOTE — Telephone Encounter (Signed)
Additional prior auth form received, completed & faxed back to insurance.

## 2022-08-08 NOTE — Telephone Encounter (Signed)
Bilateral Zilretta approved 1.22.2024-4.22.2024.

## 2022-08-12 ENCOUNTER — Ambulatory Visit: Payer: Medicare HMO | Admitting: Family Medicine

## 2022-08-16 ENCOUNTER — Ambulatory Visit: Payer: Self-pay

## 2022-08-16 ENCOUNTER — Ambulatory Visit (INDEPENDENT_AMBULATORY_CARE_PROVIDER_SITE_OTHER): Payer: Medicare HMO | Admitting: Family Medicine

## 2022-08-16 DIAGNOSIS — G8929 Other chronic pain: Secondary | ICD-10-CM | POA: Diagnosis not present

## 2022-08-16 DIAGNOSIS — M170 Bilateral primary osteoarthritis of knee: Secondary | ICD-10-CM | POA: Diagnosis not present

## 2022-08-16 DIAGNOSIS — M25562 Pain in left knee: Secondary | ICD-10-CM | POA: Diagnosis not present

## 2022-08-16 DIAGNOSIS — M25561 Pain in right knee: Secondary | ICD-10-CM | POA: Diagnosis not present

## 2022-08-16 MED ORDER — TRIAMCINOLONE ACETONIDE 32 MG IX SRER
64.0000 mg | Freq: Once | INTRA_ARTICULAR | Status: AC
  Administered 2022-08-16: 64 mg via INTRA_ARTICULAR

## 2022-08-16 NOTE — Patient Instructions (Addendum)
Thank you for coming in today.   You received an injection today. Seek immediate medical attention if the joint becomes red, extremely painful, or is oozing fluid.   Recheck in about 3 months.  Call to schedule Zilretta and get it authorized.

## 2022-08-16 NOTE — Progress Notes (Signed)
Michelle [name] presents to clinic today for previously arranged Zilretta injection bilateral knees.  Procedure: Real-time Ultrasound Guided Injection of right knee superior lateral patellar space Device: Philips Affiniti 50G Images permanently stored and available for review in PACS Verbal informed consent obtained.  Discussed risks and benefits of procedure. Warned about infection, bleeding, hyperglycemia damage to structures among others. Patient expresses understanding and agreement Time-out conducted.   Noted no overlying erythema, induration, or other signs of local infection.   Skin prepped in a sterile fashion.   Local anesthesia: Topical Ethyl chloride.   With sterile technique and under real time ultrasound guidance: Zilretta 32 mg injected into knee joint. Fluid seen entering the joint capsule.   Completed without difficulty   Advised to call if fevers/chills, erythema, induration, drainage, or persistent bleeding.   Images permanently stored and available for review in the ultrasound unit.  Impression: Technically successful ultrasound guided injection.    Procedure: Real-time Ultrasound Guided Injection of left knee superior lateral patellar space Device: Philips Affiniti 50G Images permanently stored and available for review in PACS Verbal informed consent obtained.  Discussed risks and benefits of procedure. Warned about infection, bleeding, hyperglycemia damage to structures among others. Patient expresses understanding and agreement Time-out conducted.   Noted no overlying erythema, induration, or other signs of local infection.   Skin prepped in a sterile fashion.   Local anesthesia: Topical Ethyl chloride.   With sterile technique and under real time ultrasound guidance: Zilretta 32 mg injected into knee joint. Fluid seen entering the joint capsule.   Completed without difficulty   Advised to call if fevers/chills, erythema, induration, drainage, or persistent bleeding.    Images permanently stored and available for review in the ultrasound unit.  Impression: Technically successful ultrasound guided injection.    Lot number: [id number] for both injections

## 2022-08-19 ENCOUNTER — Telehealth: Payer: Self-pay | Admitting: Family Medicine

## 2022-08-19 ENCOUNTER — Encounter: Payer: Self-pay | Admitting: Family Medicine

## 2022-08-19 ENCOUNTER — Ambulatory Visit (INDEPENDENT_AMBULATORY_CARE_PROVIDER_SITE_OTHER): Payer: Medicare HMO | Admitting: Family Medicine

## 2022-08-19 VITALS — BP 120/72 | HR 72 | Temp 97.8°F | Ht 65.0 in | Wt 183.0 lb

## 2022-08-19 DIAGNOSIS — E119 Type 2 diabetes mellitus without complications: Secondary | ICD-10-CM | POA: Diagnosis not present

## 2022-08-19 DIAGNOSIS — Z794 Long term (current) use of insulin: Secondary | ICD-10-CM

## 2022-08-19 DIAGNOSIS — N183 Chronic kidney disease, stage 3 unspecified: Secondary | ICD-10-CM | POA: Diagnosis not present

## 2022-08-19 DIAGNOSIS — J449 Chronic obstructive pulmonary disease, unspecified: Secondary | ICD-10-CM

## 2022-08-19 DIAGNOSIS — I2510 Atherosclerotic heart disease of native coronary artery without angina pectoris: Secondary | ICD-10-CM

## 2022-08-19 DIAGNOSIS — G40909 Epilepsy, unspecified, not intractable, without status epilepticus: Secondary | ICD-10-CM | POA: Diagnosis not present

## 2022-08-19 DIAGNOSIS — E1169 Type 2 diabetes mellitus with other specified complication: Secondary | ICD-10-CM

## 2022-08-19 DIAGNOSIS — I5032 Chronic diastolic (congestive) heart failure: Secondary | ICD-10-CM | POA: Diagnosis not present

## 2022-08-19 DIAGNOSIS — F0150 Vascular dementia without behavioral disturbance: Secondary | ICD-10-CM | POA: Diagnosis not present

## 2022-08-19 DIAGNOSIS — E785 Hyperlipidemia, unspecified: Secondary | ICD-10-CM

## 2022-08-19 LAB — CBC WITH DIFFERENTIAL/PLATELET
Basophils Absolute: 0 10*3/uL (ref 0.0–0.1)
Basophils Relative: 0.2 % (ref 0.0–3.0)
Eosinophils Absolute: 0 10*3/uL (ref 0.0–0.7)
Eosinophils Relative: 0.2 % (ref 0.0–5.0)
HCT: 37.3 % (ref 36.0–46.0)
Hemoglobin: 12.5 g/dL (ref 12.0–15.0)
Lymphocytes Relative: 16 % (ref 12.0–46.0)
Lymphs Abs: 2 10*3/uL (ref 0.7–4.0)
MCHC: 33.5 g/dL (ref 30.0–36.0)
MCV: 90.2 fl (ref 78.0–100.0)
Monocytes Absolute: 0.9 10*3/uL (ref 0.1–1.0)
Monocytes Relative: 7.4 % (ref 3.0–12.0)
Neutro Abs: 9.4 10*3/uL — ABNORMAL HIGH (ref 1.4–7.7)
Neutrophils Relative %: 76.2 % (ref 43.0–77.0)
Platelets: 351 10*3/uL (ref 150.0–400.0)
RBC: 4.14 Mil/uL (ref 3.87–5.11)
RDW: 13.9 % (ref 11.5–15.5)
WBC: 12.4 10*3/uL — ABNORMAL HIGH (ref 4.0–10.5)

## 2022-08-19 LAB — LIPID PANEL
Cholesterol: 122 mg/dL (ref 0–200)
HDL: 53.4 mg/dL (ref >39.00)
LDL Cholesterol: 32 mg/dL (ref 0–99)
NonHDL: 68.89
Total CHOL/HDL Ratio: 2
Triglycerides: 183 mg/dL — ABNORMAL HIGH (ref 0.0–149.0)
VLDL: 36.6 mg/dL (ref 0.0–40.0)

## 2022-08-19 LAB — COMPREHENSIVE METABOLIC PANEL
ALT: 12 U/L (ref 0–35)
AST: 12 U/L (ref 0–37)
Albumin: 4.3 g/dL (ref 3.5–5.2)
Alkaline Phosphatase: 36 U/L — ABNORMAL LOW (ref 39–117)
BUN: 35 mg/dL — ABNORMAL HIGH (ref 6–23)
CO2: 27 mEq/L (ref 19–32)
Calcium: 10.2 mg/dL (ref 8.4–10.5)
Chloride: 99 mEq/L (ref 96–112)
Creatinine, Ser: 0.99 mg/dL (ref 0.40–1.20)
GFR: 51.22 mL/min — ABNORMAL LOW (ref >60.00)
Glucose, Bld: 116 mg/dL — ABNORMAL HIGH (ref 70–99)
Potassium: 4.4 mEq/L (ref 3.5–5.1)
Sodium: 135 mEq/L (ref 135–145)
Total Bilirubin: 0.3 mg/dL (ref 0.2–1.2)
Total Protein: 7.3 g/dL (ref 6.0–8.3)

## 2022-08-19 LAB — HEMOGLOBIN A1C: Hgb A1c MFr Bld: 6 % (ref 4.6–6.5)

## 2022-08-19 NOTE — Progress Notes (Signed)
Phone [phone] In person visit   Subjective:   Michelle [name] is a 89 y.o. year old very pleasant female patient who presents for/with See problem oriented charting Chief Complaint  Patient presents with   Follow-up   Hypertension    Past Medical History-  Patient Active Problem List   Diagnosis Date Noted   Epilepsy ([place]) 03/12/2021    Priority: High   Dementia ([place]) 10/22/2020    Priority: High   Delirium 06/25/2020    Priority: High   Chronic diastolic CHF (congestive heart failure) ([place]) 03/13/2014    Priority: High   CAD- RCA PCI '90s, STEMI-RCA DES 02/18/14 03/05/2014    Priority: High   Type 2 diabetes mellitus without complication, with long-term current use of insulin ([place]) 03/26/2007    Priority: High   Anemia, iron deficiency 10/23/2015    Priority: Medium    CKD (chronic kidney disease), stage III ([place]) 07/25/2014    Priority: Medium    Osteoarthritis, knee 05/13/2014    Priority: Medium    Anxiety state 04/21/2014    Priority: Medium    SOB (shortness of breath) 03/05/2014    Priority: Medium    Hyperlipidemia associated with type 2 diabetes mellitus ([place]) 03/26/2007    Priority: Medium    Essential hypertension 04/21/2006    Priority: Medium    COPD (chronic obstructive pulmonary disease) ([place]) 04/21/2006    Priority: Medium    Actinic keratosis 11/17/2017    Priority: Low   Back pain, lumbosacral 10/10/2012    Priority: Low   Chronic pain of both knees 04/05/2022   Adnexal mass 11/24/2020   Thickened endometrium 11/24/2020    Medications- reviewed and updated Current Outpatient Medications  Medication Sig Dispense Refill   acetaminophen (TYLENOL) 650 MG CR tablet Take 1,300 mg by mouth 3 (three) times daily. Patient take 3 times a day.     albuterol (PROVENTIL) (2.5 MG/3ML) 0.083% nebulizer solution USE THREE MILLILITERS VIA NEBULIZATION BY MOUTH EVERY 6 HOURS AS NEEDED FOR WHEEZING OR SHORTNESS OF BREATH 75 mL 3   albuterol (VENTOLIN HFA)  108 (90 Base) MCG/ACT inhaler INHALE 1 PUFF EVERY 4 HOURS AS NEEDED FOR WHEEZING, SHORTNESS OF BREATH (RESCUE INHALER IF ADVAIR NOT WORKING) 2 each 2   Alcohol Swabs (DROPSAFE ALCOHOL PREP) 70 % PADS USE  FOR  TESTING THREE TIMES DAILY 300 each 1   amLODipine (NORVASC) 2.5 MG tablet Take 2.5 mg in the morning. If afternoon blood pressure >145/90 take second tablet of 2.5 180 tablet 3   aspirin 81 MG tablet Take 81 mg by mouth daily with breakfast.     Blood Glucose Calibration (ACCU-CHEK SMARTVIEW CONTROL) LIQD      blood glucose meter kit and supplies KIT Dispense based on patient and insurance preference. Use up to four times daily as directed. Dx:E11.9 1 each 3   calcium carbonate (OSCAL) 1500 (600 Ca) MG TABS tablet Take 600 mg of elemental calcium by mouth daily with breakfast.     carvedilol (COREG) 12.5 MG tablet TAKE 1 TABLET TWICE DAILY 180 tablet 3   cholecalciferol (VITAMIN D3) 25 MCG (1000 UNIT) tablet Take 1,000 Units by mouth daily with breakfast.     clopidogrel (PLAVIX) 75 MG tablet TAKE 1 TABLET EVERY DAY 90 tablet 1   diclofenac Sodium (VOLTAREN) 1 % GEL APPLY 2 GRAMS TOPICALLY TO AFFECTED AREA FOUR TIMES A DAY 300 g 3   DROPLET PEN NEEDLES 31G X 8 MM MISC USE AS DIRECTED WITH LANTUS  SOLOSTAR PEN 100 each 4   enalapril (VASOTEC) 20 MG tablet TAKE 1 TABLET TWICE DAILY 180 tablet 3   fenofibrate micronized (LOFIBRA) 134 MG capsule TAKE 1 CAPSULE EVERY DAY 90 capsule 3   ferrous sulfate 325 (65 FE) MG tablet Twice a week (Patient taking differently: Take 325 mg by mouth 2 (two) times a week.) 18 tablet 3   fluticasone-salmeterol (ADVAIR) 250-50 MCG/ACT AEPB INHALE 1 PUFF TWICE DAILY 884 each 3   folic acid (FOLVITE) 166 MCG tablet Take 400 mcg by mouth daily with breakfast.     furosemide (LASIX) 20 MG tablet TAKE 1 TABLET EVERY DAY 90 tablet 3   hydrALAZINE (APRESOLINE) 50 MG tablet Take 50 mg by mouth 3 (three) times daily.     lamoTRIgine (LAMICTAL) 25 MG tablet TAKE 1 TABLET  TWICE DAILY 60 tablet 0   Lancets Misc. (ACCU-CHEK FASTCLIX LANCET) KIT 1 Device by Does not apply route. Use as directed for testing blood sugar     Lancets Misc. (ACCU-CHEK FASTCLIX LANCET) KIT Use as directed to test blood sugar 1 kit 1   LANTUS SOLOSTAR 100 UNIT/ML Solostar Pen INJECT  17  TO  20 UNITS SUBCUTANEOUSLY EVERY MORNING 30 mL 5   loratadine (CLARITIN) 10 MG tablet Take 10 mg by mouth every other day.     Melatonin 5 MG CAPS Take 5 mg by mouth at bedtime.     metFORMIN (GLUCOPHAGE-XR) 500 MG 24 hr tablet TAKE ONE TABLET BY MOUTH IN THE MORNING AND AT BEDTIME 180 tablet 3   nitroGLYCERIN (NITROSTAT) 0.4 MG SL tablet DISSOLVE 1 TAB UNDER TONGUE FOR CHEST PAIN - IF PAIN REMAINS AFTER 5 MIN, CALL 911 AND REPEAT DOSE. MAX 3 TABS IN 15 MINUTES 25 tablet 6   nortriptyline (PAMELOR) 10 MG capsule TAKE 1 CAPSULE BY MOUTH AT BEDTIME FOR PAIN PREVENTION 30 capsule 1   pantoprazole (PROTONIX) 40 MG tablet TAKE 1 TABLET EVERY DAY 90 tablet 3   rosuvastatin (CRESTOR) 40 MG tablet TAKE 1 TABLET EVERY DAY 90 tablet 3   sertraline (ZOLOFT) 50 MG tablet TAKE 2 TABLETS AT BEDTIME 180 tablet 2   SPIRIVA HANDIHALER 18 MCG inhalation capsule PLACE 1 CAPSULE INTO HANDIHALER AND INHALE THE CONTENTS DAILY 90 capsule 2   spironolactone (ALDACTONE) 25 MG tablet TAKE 1 TABLET (25 MG TOTAL) BY MOUTH DAILY. 90 tablet 3   TRUE METRIX BLOOD GLUCOSE TEST test strip TEST BLOOD SUGAR UP TO FOUR TIMES DAILY AS DIRECTED 400 strip 3   TRUEplus Lancets 33G MISC TEST BLOOD SUGAR UP TO FOUR TIMES DAILY AS DIRECTED 400 each 3   VASCEPA 1 g capsule TAKE 2 CAPSULES TWICE DAILY 360 capsule 0   vitamin B-12 (CYANOCOBALAMIN) 1000 MCG tablet Take 1,000 mcg by mouth daily with breakfast.     vitamin E 400 UNIT capsule Take 400 Units by mouth at bedtime.     No current facility-administered medications for this visit.     Objective:  BP 120/72   Pulse 72   Temp 97.8 F (36.6 C)   Ht '5\' 5"'$  (1.651 m)   Wt 183 lb (83 kg)    SpO2 96%   BMI 30.45 kg/m  Gen: NAD, resting comfortably CV: RRR no murmurs rubs or gallops Lungs: CTAB no crackles, wheeze, rhonchi Ext: no edema Skin: warm, dry Neuro:  looks to sons for answers    Assessment and Plan   #HM- plans on getting covid and flu shot later today  #social update-  got ramp installed thankfully- aluminum   #Knee pain- zilretta helps the knees!   #Palliative approach-working with palliative in [place]-not yet on hospice.  - not coming very often though about every 6 weeks nurse Suanne Marker  # Adnexal mass-following with Dr. Berline Lopes of gynecologic-plan was for endometrial biopsy and ultrasound-on ultrasound 20 mm endometrial thickening and sampling recommended due to postmenopausal bleeding (thankfully only benign polyp).  Cystic left adnexal mass up to 15 cm from 11.2 cm previously concerning for ovarian neoplasm  -Patient declines definitive surgery  #Dementia likely vascular- follows with neurology since 2022. Becoming less active and some more memory issues.    #Seizure history-follows with Dr. Delice Lesch on Lamictal 25 mg twice daily . Still no issues thankfully  #CAD  #hyperlipidemia S: Medication:Aspirin 81 mg, Plavix 75 mg, fenofibrate 134 mg, Vascepa 1 g, rosuvastatin 40 mg - no chest pain or increased shortness of breath  Lab Results  Component Value Date   CHOL 133 07/08/2021   HDL 44.30 07/08/2021   LDLCALC 42 02/18/2020   LDLDIRECT 44.0 07/08/2021   TRIG (H) 07/08/2021    430.0 Triglyceride is over 400; calculations on Lipids are invalid.   CHOLHDL 3 07/08/2021  A/P: doing well other than high trig in past- update today. Likely continue current medications for lipids For CAD largely asymptomatic - continue current medications    #CHF  #hypertension-works with resistant hypertension clinic S: medication: Furosemide 20 mg, hydralazine 50 mg 3 times a day  (if below 110 they hold a dose), amlodipine 5 mg, carvedilol 12.5 mg twice daily,  enalapril 20 mg twice daily, spironolactone 25 mg daily  Home readings #s: highly variable- as low as 100s, but can be above 150 as well Edema none, weight stable within 2-3 lbs from 180-183 in recnet months, shortness of breath- no increase from baseline BP Readings from Last 3 Encounters:  08/19/22 120/72  08/01/22 136/70  05/17/22 124/62   A/P: CHF and hypertension - stable- continue current medicines   # Diabetes S: Medication: Metformin 500 mg extended release twice daily, Lantus 20units each morning CBGs- 110-140s in the mornings for most part, no low blood sugars Exercise and diet- exercise limited by immobility Lab Results  Component Value Date   HGBA1C 6.6 (H) 03/22/2022   HGBA1C 6.8 (H) 10/14/2021   HGBA1C 6.3 07/08/2021  A/P: hopefully stable- update a1c today. Continue current meds for now. Honestly might tolerate a1c up to 8  # COPD  S: Patient reports stable shortness of breath and cough . Had some increased cough in last few weeks but improving. Needed albuterol more lately but calming down Maintenance medications: Advair 250-50 mcg 1 puff twice daily, spiriva A/P: stable other than recent illness and improving- continue current medications   # CCM services- had been enrolled but getting a lot of follow up from [facility]- we opted to hold off at this time on renewing. They will cancel April 16th visit   Recommended follow up: Return in about 4 months (around 12/18/2022) for followup or sooner if needed.Schedule b4 you leave. Future Appointments  Date Time Provider [place]  09/06/2022  3:00 PM Skeet Latch, MD [facility]  11/01/2022 12:00 PM Edythe Clarity, [facility] None  06/26/2023  2:30 PM [facility] HEALTH COACH [facility]   Lab/Order associations:   ICD-10-CM   1. Type 2 diabetes mellitus without complication, with long-term current use of insulin (HCC)  E11.9 CBC with Differential/Platelet   Z79.4 Comprehensive metabolic panel  Lipid panel     Hemoglobin A1c    2. Chronic diastolic CHF (congestive heart failure) (HCC)  I50.32     3. Vascular dementia, unspecified dementia severity, unspecified whether behavioral, psychotic, or mood disturbance or anxiety ([place])  F01.50     4. Nonintractable epilepsy without status epilepticus, unspecified epilepsy type ([place])  G40.909     5. Chronic obstructive pulmonary disease, unspecified COPD type (HCC) Chronic J44.9     6. Hyperlipidemia associated with type 2 diabetes mellitus (HCC) Chronic E11.69    E78.5     7. Stage 3 chronic kidney disease, unspecified whether stage 3a or 3b CKD (HCC) Chronic N18.30     8. Coronary artery disease involving native coronary artery of native heart without angina pectoris  I25.10       No orders of the defined types were placed in this encounter.   Return precautions advised.  [name], MD

## 2022-08-19 NOTE — Telephone Encounter (Signed)
RSV and flu documented.

## 2022-08-19 NOTE — Patient Instructions (Addendum)
Get your diabetic eye exam scheduled.  Let us know when you get your next COVID vaccine and flu shot  Please stop by lab before you go If you have mychart- we will send your results within 3 business days of [place] receiving them.  If you do not have mychart- we will call you about results within 5 business days of [place] receiving them.  *please also note that you will see labs on mychart as soon as they post. I will later go in and write notes on them- will say "notes from Dr. Yong Channel"   Recommended follow up: Return in about 4 months (around 12/18/2022) for followup or sooner if needed.Schedule b4 you leave.

## 2022-08-19 NOTE — Telephone Encounter (Signed)
Caller states: -Following OV today, pt got flu and rsv vaccine today at [facility] really encouraged getting these first and didn't want to give her too many vaccines today  - He will call once COVID vaccine is given

## 2022-08-22 ENCOUNTER — Other Ambulatory Visit: Payer: Self-pay | Admitting: Family Medicine

## 2022-08-22 ENCOUNTER — Other Ambulatory Visit: Payer: Self-pay | Admitting: Neurology

## 2022-08-26 DIAGNOSIS — Z515 Encounter for palliative care: Secondary | ICD-10-CM | POA: Diagnosis not present

## 2022-08-26 DIAGNOSIS — I5032 Chronic diastolic (congestive) heart failure: Secondary | ICD-10-CM | POA: Diagnosis not present

## 2022-08-31 ENCOUNTER — Telehealth: Payer: Self-pay

## 2022-08-31 NOTE — Telephone Encounter (Signed)
[place] for Michelle [name], she has an opening on Friday morning if patient and son can come in, thanks

## 2022-08-31 NOTE — Telephone Encounter (Signed)
Sent up front for scheduling  

## 2022-08-31 NOTE — Telephone Encounter (Signed)
Michelle [name] called in stating [name] is having an increase in confusion and would like to know if we can work her in. Patient is scheduled for August and on the wait list for any cancellations.

## 2022-08-31 NOTE — Telephone Encounter (Signed)
Called son and offered appointment, declined states they have a busy schedule this week. Scheduled with Clarise Cruz for next week.

## 2022-09-06 ENCOUNTER — Encounter (HOSPITAL_BASED_OUTPATIENT_CLINIC_OR_DEPARTMENT_OTHER): Payer: Self-pay | Admitting: Cardiovascular Disease

## 2022-09-06 ENCOUNTER — Telehealth (INDEPENDENT_AMBULATORY_CARE_PROVIDER_SITE_OTHER): Payer: Medicare HMO | Admitting: Cardiovascular Disease

## 2022-09-06 VITALS — BP 105/59 | HR 92 | Ht 65.0 in | Wt 181.8 lb

## 2022-09-06 DIAGNOSIS — I10 Essential (primary) hypertension: Secondary | ICD-10-CM

## 2022-09-06 MED ORDER — HYDRALAZINE HCL 50 MG PO TABS: 50.0000 mg | ORAL_TABLET | Freq: Three times a day (TID) | ORAL | 3 refills | Status: DC

## 2022-09-06 NOTE — Patient Instructions (Signed)
Medication Instructions:  Your physician recommends that you continue on your current medications as directed. Please refer to the Current Medication list given to you today.  *If you need a refill on your cardiac medications before your next appointment, please call your pharmacy*  Lab Work: [place]  If you have labs (blood work) drawn today and your tests are completely normal, you will receive your results only by: [place] (if you have MyChart) OR A paper copy in the mail If you have any lab test that is abnormal or we need to change your treatment, we will call you to review the results.  Testing/Procedures: NONE  Follow-Up: At [facility], you and your health needs are our priority.  As part of our continuing mission to provide you with exceptional heart care, we have created designated Provider Care Teams.  These Care Teams include your primary Cardiologist (physician) and Advanced Practice Providers (APPs -  Physician Assistants and Nurse Practitioners) who all work together to provide you with the care you need, when you need it.  We recommend signing up for the patient portal called "MyChart".  Sign up information is provided on this After Visit Summary.  MyChart is used to connect with patients for Virtual Visits (Telemedicine).  Patients are able to view lab/test results, encounter notes, upcoming appointments, etc.  Non-urgent messages can be sent to your provider as well.   To learn more about what you can do with MyChart, go to [email or web address].    Your next appointment:   12/06/2022 1:30 WITH CAITLIN W NP   24-Hour Urine Collection Why am I having this test? A 24-hour urine specimen is a lab test that requires you to collect all of your urine for an entire day. This is sometimes called a timed urine test. It can provide more information than a single urine sample. There are many reasons to have this test. Your health care provider may order  the test to check for or monitor the following conditions: High blood pressure. Kidney disease. Kidney stones. Urinary tract infections. Pregnancy. Diabetes. How do I prepare for this test? You may be asked to follow a special diet during or before the collection period. Follow any instructions from your health care provider. If no special instructions are given, you may eat and drink normally. Take over-the-counter and prescription medicines only as told by your health care provider. Let your health care provider know about any medicines that you are taking, including over-the-counter medicines, vitamins, herbs, and supplements. Choose a collection day when you can be at home or when you have a place to store the urine. All urine must be collected during the testing period. How do I do a 24-hour urine collection?  When you get up in the morning, urinate in the toilet and flush. Write down the time. This will be your start time on the day of collection and your end time on the next morning. From the start time on, all of your urine should be kept in the collection jug that you received from the lab. If the jug that is given to you already has liquid in it, that is okay. Do not throw out the liquid or rinse out the jug. Urinate into a specimen container, such as a urinal or pan that sits over the toilet. Pour the urine from the container into the collection jug. Be careful not to spill any of the urine. Use the equipment provided by the lab. Do  not let any toilet paper or stool (feces) get into the jug. This will contaminate the sample. Stop collecting your urine 24 hours after you started. Collect the last specimen as close as possible to the end of the 24-hour period. Keep the jug cool in an ice chest or keep it in the refrigerator during collection. When the 24-hour collection is complete, take the jug to the lab as soon as possible. Keep the jug cool in an ice chest while you are bringing it  to the lab. What do the results mean? Talk with your health care provider about what your results mean. Questions to ask your health care provider Ask your health care provider, or the department that is doing the test: When will my results be ready? How will I get my results? What are my treatment options? What other tests do I need? What are my next steps? Summary A 24-hour urine specimen is a lab test that requires you to collect all of your urine for an entire day. When you get up in the morning, urinate in the toilet and flush. Write down the time. For the next 24 hours, collect all of your urine in the collection jug that you received from the lab. Keep the jug cool while collecting the urine and while bringing it back to the lab. Take the jug of urine back to the lab as soon as possible after the collection period has ended. This information is not intended to replace advice given to you by your health care provider. Make sure you discuss any questions you have with your health care provider. Document Revised: 01/08/2021 Document Reviewed: 01/08/2021 Elsevier Patient Education  [place].

## 2022-09-06 NOTE — Assessment & Plan Note (Signed)
Blood pressure continues to be very labile.  In general it has been better controlled.  Today and yesterday it is running lower than usual.  She will continue to take Amlodipine, carvedilol, enalapril, hydralazine, and spironolactone.  She reduces the dose of her hydralazine and amlodipine if her blood pressure is running low.  We will also check urine catecholamines and metanephrines given how labile her blood pressures have been.  Renal artery Dopplers and renin and aldosterone ratios have been normal.  She is still working to get her CPAP.  We will see if we can help with this.

## 2022-09-06 NOTE — Progress Notes (Signed)
Virtual Visit via Telephone Note   This visit type was conducted due to national recommendations for restrictions regarding the COVID-19 Pandemic (e.g. social distancing) in an effort to limit this patient's exposure and mitigate transmission in our community.  Due to her co-morbid illnesses, this patient is at least at moderate risk for complications without adequate follow up.  This format is felt to be most appropriate for this patient at this time.  All issues noted in this document were discussed and addressed.  A limited physical exam was performed with this format.  Please refer to the patient's chart for her consent to telehealth for [facility].  The patient was identified using 2 identifiers.  Patient Location: Home Physician Location: Office  Date:  10/08/2022   ID:  Michelle [name], DOB 04-14-1933, MRN [id number]  PCP:  [name], MD  Cardiologist:  Lauree Chandler, MD   Referring MD: [name], MD   CC: Hypertension  History of Present Illness:    Michelle [name] is a 89 y.o. female with a hx of CAD status post angioplasty of the RCA in 1990 and inferior STEMI 02/2014, diabetes, hypertension, hyperlipidemia, prior tobacco abuse, and chronic diastolic heart failure here for follow-up. She initially established care in the hypertension clinic 09/03/2020. She was admitted for an inferior STEMI 02/2014 and had a completely occluded RCA with an aneurysm that formed prior to the angioplasty site the aneurysmal segment was treated with 2 covered stents.  She did have a small pericardial effusion post MI.  Follow-up echo revealed resolution of her pericardial effusion and normal systolic function.  She was last seen by [facility] cardiology 05/2019.  At that appointment her blood pressure was 138/62 and she was doing well.  At some point metoprolol was started instead of carvedilol 12.5mg .  Ms. Zukauskas was admitted 08/2020 with confusion.  Her son found her in the bed  unresponsive with her jaws clenched.  Her BP at presentation was 186/81.  She is thought to have hypertensive encephalopathy and delirium.  Head CT was unremarkable.  Her symptoms improved with better BP control.  Her home amlodipine, enalapril, and spironolactone were continued.  Metoprolol was increased and hydralazine was added.  Her dose of furosemide was reduced due to concern for dehydration. Her blood pressure was controlled at her visit on 08/2020.  Cortisol was within normal limits.  Renal artery Dopplers were - 07/2020.  She reported home blood pressures averaging within 130s to 140s but ranging from 110s to 160s. She noticed her blood pressure was highest in the evenings. Hydralazine was increased to 50 mg and carvedilol was increased to 25 mg.  At the last visit she was feeling very fatigued.  Her blood pressure was 59/27 and improved to 86/44 on repeat..  In general her blood pressures had been stable ranging from the 120s to 140s over 60s to 80s.  She also reported respiratory symptoms with cough and phlegm.  We recommended holding her antihypertensives and that she follow-up with her PCP.  Hydralazine was reduced to 25 mg.  She saw her PCP on 01/2022 and her blood pressure was 93/50.  Her home readings were 120s to 130s in the morning and then 150s to 160s later in the day.  Her PCP recommended splitting her amlodipine and taking 5 in the morning with an additional 5 mg in the afternoon only if needed.  At her last visit her blood pressures were labile ranging form the 110s-150s. She was  struggling with knee pain. We recommended taking her medications more consistently at 12 hours apart. She followed up with Laurann Montana, NP 03/2022 and noted some hypotension in the afternoons. Her afternoon hydralazine was reduced to 25 mg. On her last virtual visit 04/2022, her blood pressures had been generally controlled at home but elevated on the day of her appointment which she attributed to a headache and  knee pain.  Today, she is accompanied by her son. She states that she is feeling lazy and fatigued. She slept well last night but has not felt rested. At home her blood pressure was recently 105/59. Yesterday her blood pressure was below [id number] systolic twice, improved with eating peanut butter crackers. Normally her blood pressure has not been that low. At the highest her blood pressure is sometimes up to the range of 0000000 systolic. She believes that she is eating well and drinking enough water. Her son keeps a stock of Pedialyte, but she does not always ask for one. Every once in a while she complains of pain in her knees. Since her last sleep study 2 years ago she has not yet received her CPAP. Her son has been trying to investigate this with the shipping companies. She denies any palpitations, chest pain, shortness of breath, or peripheral edema. No lightheadedness, headaches, syncope, orthopnea, or PND.   Past Medical History:  Diagnosis Date   Anginal pain ([place])    Arthritis    AV block, 1st degree    CAD (coronary artery disease) 1990; 2015   Cardiac cath 1990 with Dr. Lia Foyer and [name] Residual CAD, non obstructive per 2015 cath. STEMI status post stent in August 2015.   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 02/18/14   Promus DES to RCA with STEMI   Shortness of breath     Past Surgical History:  Procedure Laterality Date   CATARACT EXTRACTION  2020   right and left eye    CHOLECYSTECTOMY     thinks her appendix was removed at  the same time   [place]  02/18/14   Promus DES to RCA   [place] N/A 02/18/2014   Procedure: [place];  Surgeon: Jettie Booze, MD;  Location: [facility] CATH LAB;  Service: Cardiovascular;  Laterality: N/A;   PTCA  1990   PTCA of RCA   TRANSTHORACIC ECHOCARDIOGRAM  02/02/2012   mild LVH, EF 55-60%, Normal WM, Gr 1 DD; Mild MR    Current Medications: Current Meds  Medication Sig   acetaminophen (TYLENOL) 650 MG CR tablet Take 1,300 mg by mouth 3 (three) times daily. Patient take 3 times a day.   albuterol (PROVENTIL) (2.5 MG/3ML) 0.083% nebulizer solution USE THREE MILLILITERS VIA NEBULIZATION BY MOUTH EVERY 6 HOURS AS NEEDED FOR WHEEZING OR SHORTNESS OF BREATH   albuterol (VENTOLIN HFA) 108 (90 Base) MCG/ACT inhaler INHALE 1 PUFF EVERY 4 HOURS AS NEEDED FOR WHEEZING, SHORTNESS OF BREATH (RESCUE INHALER IF ADVAIR NOT WORKING)  Alcohol Swabs (DROPSAFE ALCOHOL PREP) 70 % PADS USE  FOR  TESTING THREE TIMES DAILY   amLODipine (NORVASC) 2.5 MG tablet Take 2.5 mg in the morning. If afternoon blood pressure >145/90 take second tablet of 2.5   aspirin 81 MG tablet Take 81 mg by mouth daily with breakfast.   Blood Glucose Calibration (ACCU-CHEK SMARTVIEW CONTROL) LIQD    blood glucose meter kit and supplies KIT Dispense based on patient and insurance preference. Use up to four times daily as directed. Dx:E11.9   calcium carbonate (OSCAL) 1500 (600 Ca) MG TABS tablet Take 600 mg of elemental calcium by mouth daily with breakfast.   carvedilol (COREG) 12.5 MG tablet TAKE 1 TABLET TWICE DAILY   cholecalciferol (VITAMIN D3) 25 MCG (1000 UNIT) tablet Take 1,000 Units by mouth daily with breakfast.   clopidogrel (PLAVIX) 75 MG tablet TAKE 1 TABLET EVERY DAY   diclofenac Sodium (VOLTAREN) 1 % GEL APPLY 2 GRAMS TOPICALLY TO AFFECTED AREA FOUR TIMES A DAY   DROPLET PEN NEEDLES 31G X 8 MM MISC USE  AS DIRECTED WITH LANTUS SOLOSTAR PEN   enalapril (VASOTEC) 20 MG tablet TAKE 1 TABLET TWICE DAILY   fenofibrate micronized (LOFIBRA) 134 MG capsule TAKE 1 CAPSULE EVERY DAY   ferrous sulfate 325 (65 FE) MG tablet Twice a week (Patient taking differently: Take 325 mg by mouth 2 (two) times a week.)   folic acid (FOLVITE) A999333 MCG tablet Take 400 mcg by mouth daily with breakfast.   furosemide (LASIX) 20 MG tablet TAKE 1 TABLET EVERY DAY   Lancets Misc. (ACCU-CHEK FASTCLIX LANCET) KIT 1 Device by Does not apply route. Use as directed for testing blood sugar   Lancets Misc. (ACCU-CHEK FASTCLIX LANCET) KIT Use as directed to test blood sugar   LANTUS SOLOSTAR 100 UNIT/ML Solostar Pen INJECT  17  TO  20 UNITS SUBCUTANEOUSLY EVERY MORNING   loratadine (CLARITIN) 10 MG tablet Take 10 mg by mouth every other day.   Melatonin 5 MG CAPS Take 5 mg by mouth at bedtime.   metFORMIN (GLUCOPHAGE-XR) 500 MG 24 hr tablet TAKE ONE TABLET BY MOUTH IN THE MORNING AND AT BEDTIME   nitroGLYCERIN (NITROSTAT) 0.4 MG SL tablet DISSOLVE 1 TAB UNDER TONGUE FOR CHEST PAIN - IF PAIN REMAINS AFTER 5 MIN, CALL 911 AND REPEAT DOSE. MAX 3 TABS IN 15 MINUTES   pantoprazole (PROTONIX) 40 MG tablet TAKE 1 TABLET EVERY DAY   rosuvastatin (CRESTOR) 40 MG tablet TAKE 1 TABLET EVERY DAY   sertraline (ZOLOFT) 50 MG tablet TAKE 2 TABLETS AT BEDTIME   SPIRIVA HANDIHALER 18 MCG inhalation capsule PLACE 1 CAPSULE INTO HANDIHALER AND INHALE THE CONTENTS DAILY   spironolactone (ALDACTONE) 25 MG tablet TAKE 1 TABLET EVERY DAY   TRUE METRIX BLOOD GLUCOSE TEST test strip TEST BLOOD SUGAR UP TO FOUR TIMES DAILY AS DIRECTED   TRUEplus Lancets 33G MISC TEST BLOOD SUGAR UP TO FOUR TIMES DAILY AS DIRECTED   VASCEPA 1 g capsule TAKE 2 CAPSULES TWICE DAILY   vitamin B-12 (CYANOCOBALAMIN) 1000 MCG tablet Take 1,000 mcg by mouth daily with breakfast.   vitamin E 400 UNIT capsule Take 400 Units by mouth at bedtime.   [DISCONTINUED]  fluticasone-salmeterol (ADVAIR) 250-50 MCG/ACT AEPB INHALE 1 PUFF TWICE DAILY   [DISCONTINUED] hydrALAZINE (APRESOLINE) 50 MG tablet Take 50 mg by mouth 3 (three) times daily.   [DISCONTINUED] lamoTRIgine (LAMICTAL) 25 MG tablet TAKE 1 TABLET TWICE DAILY   [DISCONTINUED] nortriptyline (PAMELOR) 10 MG capsule TAKE  1 CAPSULE BY MOUTH AT BEDTIME FOR PAIN PREVENTION     Allergies:   Codeine sulfate and Morphine sulfate   Social History   Socioeconomic History   Marital status: Single    Spouse name: Not on file   Number of children: 2   Years of education: Not on file   Highest education level: Not on file  Occupational History   Occupation: Retired    Fish farm manager: RETIRED  Tobacco Use   Smoking status: Former    Packs/day: 1.00    Years: 55.00    Additional pack years: 0.00    Total pack years: 55.00    Types: Cigarettes    Quit date: 09/29/2006    Years since quitting: 16.0   Smokeless tobacco: Never  Vaping Use   Vaping Use: Never used  Substance and Sexual Activity   Alcohol use: No   Drug use: No   Sexual activity: Not Currently  Other Topics Concern   Not on file  Social History Narrative   Lives with her son - 2 very supportive sons   Right handed   Retired   One Patent attorney house with basement   Drinks caffeine   Social Determinants of Radio broadcast assistant Strain: Low Risk  (06/20/2022)   Overall Financial Resource Strain (CARDIA)    Difficulty of Paying Living Expenses: Not hard at all  Food Insecurity: No Food Insecurity (06/20/2022)   Hunger Vital Sign    Worried About Running Out of Food in the Last Year: Never true    Laura in the Last Year: Never true  Transportation Needs: No Transportation Needs (06/20/2022)   PRAPARE - Hydrologist (Medical): No    Lack of Transportation (Non-Medical): No  Physical Activity: Inactive (06/20/2022)   Exercise Vital Sign    Days of Exercise per Week: 0 days    Minutes of Exercise per  Session: 0 min  Stress: No Stress Concern Present (06/20/2022)   [place]    Feeling of Stress : Not at all  Social Connections: Socially Isolated (06/20/2022)   Social Connection and Isolation Panel [NHANES]    Frequency of Communication with Friends and Family: Once a week    Frequency of Social Gatherings with Friends and Family: Once a week    Attends Religious Services: Never    Marine scientist or Organizations: No    Attends Music therapist: Never    Marital Status: Never married     Family History: The patient's family history includes Bone cancer in her brother; Cancer in her maternal grandfather, paternal grandfather, and paternal grandmother; Cancer (age of onset: 51) in her father; Diabetes in her son and son; Heart attack in her son; Heart attack (age of onset: 50) in her mother; Hyperlipidemia in her son; Hypertension in her son; Liver cancer in her brother. There is no history of Colon cancer, Ovarian cancer, or Uterine cancer.  ROS:   Please see the history of present illness.    (+) Fatigue/Malaise (+) Occasional knee pain All other systems reviewed and are negative.  EKGs/Labs/Other Studies Reviewed:    EKG:  EKG is personally reviewed.  02/22/22: EKG was not ordered.   08/27/20: Sinus rhythm.  Rate 73 bpm.  First-degree block.  Other studies reviewed:  Bilateral Renal Artery Doppler 07/29/2020: Summary:  Renal:  Right: Normal size right kidney. RRV flow present. No evidence of  right renal artery stenosis.   Left:  Normal size of left kidney. LRV flow present. No evidence of left renal artery stenosis.     Echo 02/2014: Study Conclusions   - Left ventricle: The cavity size was normal. Systolic function was normal. The estimated ejection fraction was in the range of 60% to 65%. Wall motion was normal; there were no regional wall motion abnormalities.  - Aortic valve: There was  mild to moderate regurgitation.  - Mitral valve: There was mild regurgitation.  - Left atrium: The atrium was mildly dilated.  - Pericardium, extracardiac: A trivial pericardial effusion was    identified posterior to the heart.   Recent Labs: 08/19/2022: ALT 12; BUN 35; Creatinine, Ser 0.99; Hemoglobin 12.5; Platelets 351.0; Potassium 4.4; Sodium 135   Recent Lipid Panel    Component Value Date/Time   CHOL 122 08/19/2022 0912   TRIG 183.0 (H) 08/19/2022 0912   TRIG 1693 (HH) 07/28/2006 1202   HDL 53.40 08/19/2022 0912   CHOLHDL 2 08/19/2022 0912   VLDL 36.6 08/19/2022 0912   LDLCALC 32 08/19/2022 0912   LDLCALC 42 02/18/2020 1529   LDLDIRECT 44.0 07/08/2021 1612    Physical Exam:    BP (!) 105/59   Pulse 92   Ht 5\' 5"  (1.651 m)   Wt 181 lb 12.8 oz (82.5 kg)   BMI 30.25 kg/m  GENERAL: Sounds well. RESP: Respirations unlabored NEURO:  Speech fluent.  PSYCH:  Cognitively intact, oriented to person place and time   ASSESSMENT:    1. Essential hypertension      PLAN:    Essential hypertension Blood pressure continues to be very labile.  In general it has been better controlled.  Today and yesterday it is running lower than usual.  She will continue to take Amlodipine, carvedilol, enalapril, hydralazine, and spironolactone.  She reduces the dose of her hydralazine and amlodipine if her blood pressure is running low.  We will also check urine catecholamines and metanephrines given how labile her blood pressures have been.  Renal artery Dopplers and renin and aldosterone ratios have been normal.  She is still working to get her CPAP.  We will see if we can help with this.    Disposition:    FU with Sadi Arave C. Oval Linsey, MD, [facility] in 3 months.  Medication Adjustments/Labs and Tests Ordered: Current medicines are reviewed at length with the patient today.  Concerns regarding medicines are outlined above.   Orders Placed This Encounter  Procedures   Metanephrines, urine, 24  hour   Catecholamines, fractionated, urine, 24 hour   Meds ordered this encounter  Medications   hydrALAZINE (APRESOLINE) 50 MG tablet    Sig: Take 1 tablet (50 mg total) by mouth 3 (three) times daily.    Dispense:  270 tablet    Refill:  3   I,Mathew Stumpf,acting as a scribe for Skeet Latch, MD.,have documented all relevant documentation on the behalf of Skeet Latch, MD,as directed by  Skeet Latch, MD while in the presence of Skeet Latch, MD.  I, [place] Oval Linsey, MD have reviewed all documentation for this visit.  The documentation of the exam, diagnosis, procedures, and orders on 10/08/2022 are all accurate and complete.  Signed, Skeet Latch, MD  10/08/2022 6:34 PM    [place]

## 2022-09-09 ENCOUNTER — Telehealth: Payer: Self-pay | Admitting: Physician Assistant

## 2022-09-09 ENCOUNTER — Ambulatory Visit: Payer: Medicare HMO | Admitting: Physician Assistant

## 2022-09-09 ENCOUNTER — Encounter: Payer: Self-pay | Admitting: Physician Assistant

## 2022-09-09 VITALS — BP 96/59 | HR 59 | Resp 18 | Ht 65.0 in

## 2022-09-09 DIAGNOSIS — R413 Other amnesia: Secondary | ICD-10-CM | POA: Diagnosis not present

## 2022-09-09 DIAGNOSIS — R5381 Other malaise: Secondary | ICD-10-CM | POA: Diagnosis not present

## 2022-09-09 DIAGNOSIS — R2689 Other abnormalities of gait and mobility: Secondary | ICD-10-CM | POA: Diagnosis not present

## 2022-09-09 NOTE — Progress Notes (Unsigned)
Assessment/Plan:   Memory Impairment, of multiple etiologies.  Michelle [name] is a very pleasant 89 y.o. RH female with a history of hypertension, hyperlipidemia, first-degree heart block, DM2, history of postmyocardial infarction pericarditis, COPD, seen today in follow up for memory loss.  She has a history of dementia likely due to vascular disease with episodes of confusion.  Patient is not on antidementia meds, however, the patient's MMSE today is 25/30. Family is concerned of cognitive decline. Of note, she was supposed to receive her CPAP machine for the last 2 year and she never did. This may be contributing to daytime sleepiness,physical deconditioning and low blood pressure which may contribute to her current cognitive status.       Follow up in 6 months. Continue 24/7 care for safety.  Patient is on palliative care. Continue to control cardiovascular risk factors Monitor BP, today is low, patient informed, recommend contacting PCP . Increase water intake  Continue to control mood as per PCP Referral PT home for strength and balance  No antidementia medication is indicated, as the risk outweighed the benefits. Continue lamotrigine 25 mg daily for epilepsy    Subjective:    This patient is accompanied in the office by her 2 sons  who supplements the history.  Previous records as well as any outside records available were reviewed prior to todays visit. Patient was last seen on 02/21/2022 via video visit.  Last MMSE in July 2022 was 20/30    Any changes in memory since last visit?  Son's report that her memory is worse.  They are concerned that she is more confused than before, with increased daytime sleepiness.  She has not been using her CPAP.  She has trouble retaining conversations "I remember there was a conversation but I do not remember the details ".  She enjoys watching TV games. repeats oneself?  Endorsed Disoriented when walking into a room?  Patient denies although  sometimes she asks her sons "are you going to take me home?  "And she is at home. Leaving objects in unusual places?    She misplaces things frequently but not in unusual places.  Wandering behavior?  denies   Any personality changes since last visit?  denies   Any worsening depression?:  denies   Hallucinations or paranoia?  denies   Seizures?    She had 1 presentation on 03/02/2022 due to a likely seizure compliant to Lamictal 25 mg twice daily.  At the time, she had "full body shaking, no tongue biting or teeth grinding "CT was negative for acute changes.  Lamictal levels were normal.  She was discharged on the same day.  Sounds denies any recent seizures.  Any sleep changes?  Sleeps well  Denies vivid dreams, REM behavior or sleepwalking   Sleep apnea?   Endorsed, dx 2 years ago, awaiting for the CPAP  Independent of bathing and dressing?  As before, she has decreased interest in showering. Does the patient needs help with medications?  Son is in charge   Who is in charge of the finances?  Son is in charge     Any changes in appetite? Not too great . she still enjoys ice cream. " I want something all the time"  Patient have trouble swallowing?  denies   Does the patient cook?  No.  Son is in charge of the meals Any headaches?   denies   Chronic back pain  denies   Ambulates with difficulty?  Both legs  chronically her due to arthritis, she uses a walker to ambulate "takes a shot that helps " Recent falls or head injuries? denies     Unilateral weakness, numbness or tingling?    denies   Any tremors?  denies   Any anosmia?  Patient denies   Any incontinence of urine?  denies.    Any bowel dysfunction?    "Comes and goes" Patient lives    Son lives with her  Does the patient drive? No longer drives.      History on Initial Assessment 09/22/2020: This is an 89 year old right-handed woman with a history of hypertension, hyperlipidemia, diabetes, CAD, 1st degree heart block, presenting for  evaluation of confusion. Her son Michelle [name] is present to provide additional information. She states "I don't remember everything too well." Michelle [name] reports that for the most part she is doing okay. She has more confusion when she is admitted in the hospital, in November she did not even recognize Michelle [name] and kept thinking he was his brother for a few days. He notices memory issues on occasion. She has been living with Michelle [name] for over 60 years. Her other son was managing her medications because she is on so many, then during the pandemic, Michelle [name] took over 2 years ago. He manages meals. Her other son has been managing her finances for the past 3 years, "he just wanted to help." She stopped driving in [id number] because it got to where she did not know what she was doing, she could not remember what was supposed to be beside of her. Michelle [name] reports it was due to her vision. She is independent with dressing and bathing.He states she does not repeat herself for the most part. In December she had more confusion, not knowing what was going on. She was brought to the ER and had an MRI brain without contrast which did not show any acute changes, there was mild to moderate diffuse atrophy and chronic microvascular disease. She was admitted to [facility] in January 2022 when he heard her snoring very loudly, he came to find her with eyes wide open, jaw clenched doing the snoring sound. She was unresponsive with body stiff. EMS got some response and she was confused in the ER wanting to go home. BP was 186/81. She had an EEG which showed intermittent generalized slowing, head CT no acute changes. TSH, B12, RPR normal. She was diagnosed with hypertensive encephalopathy and had delirium in the hospital that improved with Seroquel. Michelle [name] reports she has improved since then and has been back to normal. No further similar episodes.    She denies any gaps in time, olfactory/gustatory hallucinations, focal numbness/tingling/weakness, myoclonic jerks. She  has diarrhea frequently with some incontinence and plans to start wearing Depends. No bladder incontinence. She cannot smell anything. She has occasional hand tremors that do not affect activities. She has pain in her knees. She denies any headaches, dizziness, diplopia, dysarthria/dysphagia, neck/back pain. She has sleep difficulties, sometimes staying awake all night long. She does not take too many naps in the day. No family history of dementia, significant head injuries, alcohol use. No family history of seizures.    Diagnostic Data: MRI brain without contrast 06/2020 no acute changes, diffuse volume loss and mild chronic microvascular disease   Routine EEG 07/2020: intermittent slow, generalized   24-hour EEG in 09/2020 abnormal due to focal slowing over the left>right temporal regions, occasional left frontotemporal epileptiform discharges PREVIOUS MEDICATIONS:   CURRENT MEDICATIONS:  Outpatient Encounter Medications as  of 09/09/2022  Medication Sig   acetaminophen (TYLENOL) 650 MG CR tablet Take 1,300 mg by mouth 3 (three) times daily. Patient take 3 times a day.   albuterol (PROVENTIL) (2.5 MG/3ML) 0.083% nebulizer solution USE THREE MILLILITERS VIA NEBULIZATION BY MOUTH EVERY 6 HOURS AS NEEDED FOR WHEEZING OR SHORTNESS OF BREATH   albuterol (VENTOLIN HFA) 108 (90 Base) MCG/ACT inhaler INHALE 1 PUFF EVERY 4 HOURS AS NEEDED FOR WHEEZING, SHORTNESS OF BREATH (RESCUE INHALER IF ADVAIR NOT WORKING)   Alcohol Swabs (DROPSAFE ALCOHOL PREP) 70 % PADS USE  FOR  TESTING THREE TIMES DAILY   amLODipine (NORVASC) 2.5 MG tablet Take 2.5 mg in the morning. If afternoon blood pressure >145/90 take second tablet of 2.5   aspirin 81 MG tablet Take 81 mg by mouth daily with breakfast.   Blood Glucose Calibration (ACCU-CHEK SMARTVIEW CONTROL) LIQD    blood glucose meter kit and supplies KIT Dispense based on patient and insurance preference. Use up to four times daily as directed. Dx:E11.9   calcium carbonate  (OSCAL) 1500 (600 Ca) MG TABS tablet Take 600 mg of elemental calcium by mouth daily with breakfast.   carvedilol (COREG) 12.5 MG tablet TAKE 1 TABLET TWICE DAILY   cholecalciferol (VITAMIN D3) 25 MCG (1000 UNIT) tablet Take 1,000 Units by mouth daily with breakfast.   clopidogrel (PLAVIX) 75 MG tablet TAKE 1 TABLET EVERY DAY   diclofenac Sodium (VOLTAREN) 1 % GEL APPLY 2 GRAMS TOPICALLY TO AFFECTED AREA FOUR TIMES A DAY   DROPLET PEN NEEDLES 31G X 8 MM MISC USE AS DIRECTED WITH LANTUS SOLOSTAR PEN   enalapril (VASOTEC) 20 MG tablet TAKE 1 TABLET TWICE DAILY   fenofibrate micronized (LOFIBRA) 134 MG capsule TAKE 1 CAPSULE EVERY DAY   ferrous sulfate 325 (65 FE) MG tablet Twice a week (Patient taking differently: Take 325 mg by mouth 2 (two) times a week.)   fluticasone-salmeterol (ADVAIR) 250-50 MCG/ACT AEPB INHALE 1 PUFF TWICE DAILY   folic acid (FOLVITE) A999333 MCG tablet Take 400 mcg by mouth daily with breakfast.   furosemide (LASIX) 20 MG tablet TAKE 1 TABLET EVERY DAY   hydrALAZINE (APRESOLINE) 50 MG tablet Take 1 tablet (50 mg total) by mouth 3 (three) times daily.   lamoTRIgine (LAMICTAL) 25 MG tablet TAKE 1 TABLET TWICE DAILY   Lancets Misc. (ACCU-CHEK FASTCLIX LANCET) KIT 1 Device by Does not apply route. Use as directed for testing blood sugar   Lancets Misc. (ACCU-CHEK FASTCLIX LANCET) KIT Use as directed to test blood sugar   LANTUS SOLOSTAR 100 UNIT/ML Solostar Pen INJECT  17  TO  20 UNITS SUBCUTANEOUSLY EVERY MORNING   loratadine (CLARITIN) 10 MG tablet Take 10 mg by mouth every other day.   Melatonin 5 MG CAPS Take 5 mg by mouth at bedtime.   metFORMIN (GLUCOPHAGE-XR) 500 MG 24 hr tablet TAKE ONE TABLET BY MOUTH IN THE MORNING AND AT BEDTIME   nitroGLYCERIN (NITROSTAT) 0.4 MG SL tablet DISSOLVE 1 TAB UNDER TONGUE FOR CHEST PAIN - IF PAIN REMAINS AFTER 5 MIN, CALL 911 AND REPEAT DOSE. MAX 3 TABS IN 15 MINUTES   nortriptyline (PAMELOR) 10 MG capsule TAKE 1 CAPSULE BY MOUTH AT BEDTIME  FOR PAIN PREVENTION   pantoprazole (PROTONIX) 40 MG tablet TAKE 1 TABLET EVERY DAY   rosuvastatin (CRESTOR) 40 MG tablet TAKE 1 TABLET EVERY DAY   sertraline (ZOLOFT) 50 MG tablet TAKE 2 TABLETS AT BEDTIME   SPIRIVA HANDIHALER 18 MCG inhalation capsule PLACE 1 CAPSULE INTO  HANDIHALER AND INHALE THE CONTENTS DAILY   spironolactone (ALDACTONE) 25 MG tablet TAKE 1 TABLET EVERY DAY   TRUE METRIX BLOOD GLUCOSE TEST test strip TEST BLOOD SUGAR UP TO FOUR TIMES DAILY AS DIRECTED   TRUEplus Lancets 33G MISC TEST BLOOD SUGAR UP TO FOUR TIMES DAILY AS DIRECTED   VASCEPA 1 g capsule TAKE 2 CAPSULES TWICE DAILY   vitamin B-12 (CYANOCOBALAMIN) 1000 MCG tablet Take 1,000 mcg by mouth daily with breakfast.   vitamin E 400 UNIT capsule Take 400 Units by mouth at bedtime.   No facility-administered encounter medications on file as of 09/09/2022.       02/02/2021    3:00 PM  MMSE - Mini Mental State Exam  Orientation to time 4  Orientation to Place 4  Registration 3  Attention/ Calculation 0  Recall 0  Language- name 2 objects 2  Language- repeat 1  Language- follow 3 step command 3  Language- read & follow direction 1  Write a sentence 1  Copy design 1  Total score 20      09/22/2020    2:00 PM  Montreal Cognitive Assessment   Visuospatial/ Executive (0/5) 1  Naming (0/3) 2  Attention: Read list of digits (0/2) 2  Attention: Read list of letters (0/1) 0  Attention: Serial 7 subtraction starting at 100 (0/3) 1  Language: Repeat phrase (0/2) 1  Language : Fluency (0/1) 0  Abstraction (0/2) 0  Delayed Recall (0/5) 0  Orientation (0/6) 6  Total 13  Adjusted Score (based on education) 14    Objective:     PHYSICAL EXAMINATION:    VITALS:   Vitals:   09/09/22 1050  BP: (!) 96/59  Pulse: (!) 59  Resp: 18  SpO2: 98%  Height: '5\' 5"'$  (1.651 m)    GEN:  The patient appears stated age and is in NAD.  Very somnolent today, falls asleep during the conversation.  However, on responding,  she is alert and eloquent. HEENT:  Normocephalic, atraumatic.   Neurological examination:  General: NAD, well-groomed, appears stated age. Orientation: The patient is alert. Oriented to person, not to place, but to date Cranial nerves: There is good facial symmetry.The speech is fluent and clear. No aphasia or dysarthria. Fund of knowledge is appropriate. Recent and remote memory are impaired. Attention and concentration are reduced.  Able to name objects and repeat phrases.  Hearing is intact to conversational tone.    Sensation: Sensation is intact to light touch throughout Motor: Strength is at least antigravity x4. DTR's 1/4 in UE/LE     Movement examination: Tone: There is normal tone in the UE/LE Abnormal movements:  no tremor.  No myoclonus.  No asterixis.   Coordination:  There is no decremation with RAM's. Normal finger to nose  Gait and Station: Patient is in a wheelchair, tired appearing, gait has not been tested.    Thank you for allowing [place] the opportunity to participate in the care of this nice patient. Please do not hesitate to contact us for any questions or concerns.   Total time spent on today's visit was 45 minutes dedicated to this patient today, preparing to see patient, examining the patient, ordering tests and/or medications and counseling the patient, documenting clinical information in the EHR or other health record, independently interpreting results and communicating results to the patient/family, discussing treatment and goals, answering patient's questions and coordinating care.  Cc:  [name], MD  Sharene Butters 09/09/2022 12:52 PM

## 2022-09-09 NOTE — Patient Instructions (Addendum)
Continue Lamotrigine '25mg'$  twice a day  2. Continue 24/7 care.  Recommend PT for stregth and balance   Please follow about CPAP   3. Follow-up in 6 months, call for any changes   Feel free to visit Facebook page " [email or web address]" for tips of how to care for people with memory problems.   Seizure Precautions: 1. If medication has been prescribed for you to prevent seizures, take it exactly as directed.  Do not stop taking the medicine without talking to your doctor first, even if you have not had a seizure in a long time.   2. Avoid activities in which a seizure would cause danger to yourself or to others.  Don't operate dangerous machinery, swim alone, or climb in high or dangerous places, such as on ladders, roofs, or girders.  Do not drive unless your doctor says you may.  3. If you have any warning that you may have a seizure, lay down in a safe place where you can't hurt yourself.    4.  No driving for 6 months from last seizure, as per [facility].   Please refer to the following link on the [facility] website for more information: http://[email or web address]/[email or web address]/[email or web address]   5.  Maintain good sleep hygiene.  6.  Contact your doctor if you have any problems that may be related to the medicine you are taking.  7.  Call 911 and bring the patient back to the ED if:        A.  The seizure lasts longer than 5 minutes.       B.  The patient doesn't awaken shortly after the seizure  C.  The patient has new problems such as difficulty seeing, speaking or moving  D.  The patient was injured during the seizure  E.  The patient has a temperature over 102 F (39C)  F.  The patient vomited and now is having trouble breathing

## 2022-09-09 NOTE — Telephone Encounter (Signed)
Pt's son called in stating they remembered they had [place] years ago and really thought they did a great job and would like to use them again if possible.

## 2022-09-12 ENCOUNTER — Other Ambulatory Visit: Payer: Self-pay | Admitting: Family Medicine

## 2022-09-12 ENCOUNTER — Telehealth: Payer: Self-pay | Admitting: Surgery

## 2022-09-12 ENCOUNTER — Other Ambulatory Visit: Payer: Self-pay | Admitting: Gynecologic Oncology

## 2022-09-12 DIAGNOSIS — R9389 Abnormal findings on diagnostic imaging of other specified body structures: Secondary | ICD-10-CM

## 2022-09-12 DIAGNOSIS — N950 Postmenopausal bleeding: Secondary | ICD-10-CM

## 2022-09-12 DIAGNOSIS — R269 Unspecified abnormalities of gait and mobility: Secondary | ICD-10-CM

## 2022-09-12 MED ORDER — MEDROXYPROGESTERONE ACETATE 10 MG PO TABS: 10.0000 mg | ORAL_TABLET | Freq: Every day | ORAL | 3 refills | Status: DC

## 2022-09-12 NOTE — Progress Notes (Signed)
See RN note. Per Dr. Berline Lopes, plan for provera 10 mg once daily.

## 2022-09-12 NOTE — Telephone Encounter (Signed)
Called patient's son to let him know that Dr Berline Lopes has recommended patient start Provera pills. Prescription called in to their preferred pharmacy. Advised patient to take this medication for a few weeks and to call our office is symptoms worsen or for any other concerns. [name] (son) had no other concerns at this time.

## 2022-09-12 NOTE — Telephone Encounter (Signed)
[name] called in stating she is having vaginal bleeding again. Bleeding started Friday evening and was mild, only on tissue when she wiped, but over the weekend worsened and she has bled through her sheets when sleeping at night and naps. Patient does not wear pads or depends when she sleeps. Patient also complains of worsening fatigue over the last week. [name] states Dr Berline Lopes had previously mentioned a medication that would help with the bleeding, but wanted to know if she needed to come in to be evaluated first. Message routed to Dr Berline Lopes and son informed our office will call back with Dr Charisse March recommendations.

## 2022-09-13 NOTE — Telephone Encounter (Signed)
Sent to [facility], [place]

## 2022-09-13 NOTE — Addendum Note (Signed)
Addended by: Elspeth Cho R on: 09/13/2022 10:23 AM   Modules accepted: Orders

## 2022-09-14 DIAGNOSIS — I509 Heart failure, unspecified: Secondary | ICD-10-CM | POA: Diagnosis not present

## 2022-09-14 DIAGNOSIS — E785 Hyperlipidemia, unspecified: Secondary | ICD-10-CM | POA: Diagnosis not present

## 2022-09-14 DIAGNOSIS — M1990 Unspecified osteoarthritis, unspecified site: Secondary | ICD-10-CM | POA: Diagnosis not present

## 2022-09-14 DIAGNOSIS — J449 Chronic obstructive pulmonary disease, unspecified: Secondary | ICD-10-CM | POA: Diagnosis not present

## 2022-09-14 DIAGNOSIS — I2510 Atherosclerotic heart disease of native coronary artery without angina pectoris: Secondary | ICD-10-CM | POA: Diagnosis not present

## 2022-09-14 DIAGNOSIS — F0394 Unspecified dementia, unspecified severity, with anxiety: Secondary | ICD-10-CM | POA: Diagnosis not present

## 2022-09-14 DIAGNOSIS — I440 Atrioventricular block, first degree: Secondary | ICD-10-CM | POA: Diagnosis not present

## 2022-09-14 DIAGNOSIS — E119 Type 2 diabetes mellitus without complications: Secondary | ICD-10-CM | POA: Diagnosis not present

## 2022-09-14 DIAGNOSIS — I110 Hypertensive heart disease with heart failure: Secondary | ICD-10-CM | POA: Diagnosis not present

## 2022-09-18 DIAGNOSIS — I440 Atrioventricular block, first degree: Secondary | ICD-10-CM | POA: Diagnosis not present

## 2022-09-18 DIAGNOSIS — E119 Type 2 diabetes mellitus without complications: Secondary | ICD-10-CM | POA: Diagnosis not present

## 2022-09-18 DIAGNOSIS — F0394 Unspecified dementia, unspecified severity, with anxiety: Secondary | ICD-10-CM | POA: Diagnosis not present

## 2022-09-18 DIAGNOSIS — I2510 Atherosclerotic heart disease of native coronary artery without angina pectoris: Secondary | ICD-10-CM | POA: Diagnosis not present

## 2022-09-18 DIAGNOSIS — I509 Heart failure, unspecified: Secondary | ICD-10-CM | POA: Diagnosis not present

## 2022-09-18 DIAGNOSIS — M1990 Unspecified osteoarthritis, unspecified site: Secondary | ICD-10-CM | POA: Diagnosis not present

## 2022-09-18 DIAGNOSIS — I110 Hypertensive heart disease with heart failure: Secondary | ICD-10-CM | POA: Diagnosis not present

## 2022-09-18 DIAGNOSIS — J449 Chronic obstructive pulmonary disease, unspecified: Secondary | ICD-10-CM | POA: Diagnosis not present

## 2022-09-18 DIAGNOSIS — E785 Hyperlipidemia, unspecified: Secondary | ICD-10-CM | POA: Diagnosis not present

## 2022-09-19 ENCOUNTER — Other Ambulatory Visit: Payer: Self-pay | Admitting: Neurology

## 2022-09-19 DIAGNOSIS — I5032 Chronic diastolic (congestive) heart failure: Secondary | ICD-10-CM | POA: Diagnosis not present

## 2022-09-19 DIAGNOSIS — Z515 Encounter for palliative care: Secondary | ICD-10-CM | POA: Diagnosis not present

## 2022-09-20 NOTE — Progress Notes (Signed)
[facility] Note  10/04/2022     CHIEF COMPLAINT Patient presents for Retina Follow Up   HISTORY OF PRESENT ILLNESS: Michelle [name] is a 89 y.o. female who presents to the clinic today for:   HPI     Retina Follow Up   Patient presents with  Other.  In both eyes.  This started 1 year ago.  I, the attending physician,  performed the HPI with the patient and updated documentation appropriately.        Comments   Patient here for 1 year retina follow up for DM exam. Patient states vision is ok. Uses readers. No eye pain.       Last edited by Bernarda Caffey, MD on 10/06/2022 10:50 PM.    Pt states vision is stable, no spikes in BP or BG   Referring physician: [name], MD [place],  [place] [id number]  HISTORICAL INFORMATION:   Selected notes from the MEDICAL RECORD NUMBER Referred by Dr. Yong Channel (PCP) for DM exam LEE:  Ocular Hx- PMH-   CURRENT MEDICATIONS: No current outpatient medications on file. (Ophthalmic Drugs)   No current facility-administered medications for this visit. (Ophthalmic Drugs)   Current Outpatient Medications (Other)  Medication Sig   acetaminophen (TYLENOL) 650 MG CR tablet Take 1,300 mg by mouth 3 (three) times daily. Patient take 3 times a day.   albuterol (PROVENTIL) (2.5 MG/3ML) 0.083% nebulizer solution USE THREE MILLILITERS VIA NEBULIZATION BY MOUTH EVERY 6 HOURS AS NEEDED FOR WHEEZING OR SHORTNESS OF BREATH   albuterol (VENTOLIN HFA) 108 (90 Base) MCG/ACT inhaler INHALE 1 PUFF EVERY 4 HOURS AS NEEDED FOR WHEEZING, SHORTNESS OF BREATH (RESCUE INHALER IF ADVAIR NOT WORKING)   Alcohol Swabs (DROPSAFE ALCOHOL PREP) 70 % PADS USE  FOR  TESTING THREE TIMES DAILY   amLODipine (NORVASC) 2.5 MG tablet Take 2.5 mg in the morning. If afternoon blood pressure >145/90 take second tablet of 2.5   aspirin 81 MG tablet Take 81 mg by mouth daily with breakfast.   Blood Glucose Calibration (ACCU-CHEK  SMARTVIEW CONTROL) LIQD    blood glucose meter kit and supplies KIT Dispense based on patient and insurance preference. Use up to four times daily as directed. Dx:E11.9   calcium carbonate (OSCAL) 1500 (600 Ca) MG TABS tablet Take 600 mg of elemental calcium by mouth daily with breakfast.   carvedilol (COREG) 12.5 MG tablet TAKE 1 TABLET TWICE DAILY   cholecalciferol (VITAMIN D3) 25 MCG (1000 UNIT) tablet Take 1,000 Units by mouth daily with breakfast.   clopidogrel (PLAVIX) 75 MG tablet TAKE 1 TABLET EVERY DAY   diclofenac Sodium (VOLTAREN) 1 % GEL APPLY 2 GRAMS TOPICALLY TO AFFECTED AREA FOUR TIMES A DAY   DROPLET PEN NEEDLES 31G X 8 MM MISC USE AS DIRECTED WITH LANTUS SOLOSTAR PEN   enalapril (VASOTEC) 20 MG tablet TAKE 1 TABLET TWICE DAILY   fenofibrate micronized (LOFIBRA) 134 MG capsule TAKE 1 CAPSULE EVERY DAY   ferrous sulfate 325 (65 FE) MG tablet Twice a week (Patient taking differently: Take 325 mg by mouth 2 (two) times a week.)   fluticasone-salmeterol (ADVAIR) 250-50 MCG/ACT AEPB INHALE 1 PUFF TWICE DAILY   folic acid (FOLVITE) 245 MCG tablet Take 400 mcg by mouth daily with breakfast.   furosemide (LASIX) 20 MG tablet TAKE 1 TABLET EVERY DAY   hydrALAZINE (APRESOLINE) 50 MG tablet Take 1 tablet (50 mg total) by mouth 3 (three) times daily.  lamoTRIgine (LAMICTAL) 25 MG tablet TAKE 1 TABLET TWICE DAILY   Lancets Misc. (ACCU-CHEK FASTCLIX LANCET) KIT 1 Device by Does not apply route. Use as directed for testing blood sugar   Lancets Misc. (ACCU-CHEK FASTCLIX LANCET) KIT Use as directed to test blood sugar   LANTUS SOLOSTAR 100 UNIT/ML Solostar Pen INJECT  17  TO  20 UNITS SUBCUTANEOUSLY EVERY MORNING   loratadine (CLARITIN) 10 MG tablet Take 10 mg by mouth every other day.   medroxyPROGESTERone (PROVERA) 10 MG tablet Take 1 tablet (10 mg total) by mouth daily.   Melatonin 5 MG CAPS Take 5 mg by mouth at bedtime.   metFORMIN (GLUCOPHAGE-XR) 500 MG 24 hr tablet TAKE ONE TABLET BY  MOUTH IN THE MORNING AND AT BEDTIME   nitroGLYCERIN (NITROSTAT) 0.4 MG SL tablet DISSOLVE 1 TAB UNDER TONGUE FOR CHEST PAIN - IF PAIN REMAINS AFTER 5 MIN, CALL 911 AND REPEAT DOSE. MAX 3 TABS IN 15 MINUTES   nortriptyline (PAMELOR) 10 MG capsule TAKE 1 CAPSULE BY MOUTH AT BEDTIME AS NEEDED FOR PAIN PREVENTION   pantoprazole (PROTONIX) 40 MG tablet TAKE 1 TABLET EVERY DAY   rosuvastatin (CRESTOR) 40 MG tablet TAKE 1 TABLET EVERY DAY   sertraline (ZOLOFT) 50 MG tablet TAKE 2 TABLETS AT BEDTIME   SPIRIVA HANDIHALER 18 MCG inhalation capsule PLACE 1 CAPSULE INTO HANDIHALER AND INHALE THE CONTENTS DAILY   spironolactone (ALDACTONE) 25 MG tablet TAKE 1 TABLET EVERY DAY   TRUE METRIX BLOOD GLUCOSE TEST test strip TEST BLOOD SUGAR UP TO FOUR TIMES DAILY AS DIRECTED   TRUEplus Lancets 33G MISC TEST BLOOD SUGAR UP TO FOUR TIMES DAILY AS DIRECTED   VASCEPA 1 g capsule TAKE 2 CAPSULES TWICE DAILY   vitamin B-12 (CYANOCOBALAMIN) 1000 MCG tablet Take 1,000 mcg by mouth daily with breakfast.   vitamin E 400 UNIT capsule Take 400 Units by mouth at bedtime.   No current facility-administered medications for this visit. (Other)   REVIEW OF SYSTEMS: ROS   Positive for: Endocrine, Cardiovascular, Eyes, Respiratory Negative for: Constitutional, Gastrointestinal, Neurological, Skin, Genitourinary, Musculoskeletal, HENT, Psychiatric, Allergic/Imm, Heme/Lymph Last edited by Theodore Demark, COA on 10/04/2022  2:47 PM.      ALLERGIES Allergies  Allergen Reactions   Codeine Sulfate Nausea Only   Morphine Sulfate Nausea Only   PAST MEDICAL HISTORY Past Medical History:  Diagnosis Date   Anginal pain ([place])    Arthritis    AV block, 1st degree    CAD (coronary artery disease) 1990; 2015   Cardiac cath 1990 with Dr. Lia Foyer and [name] Residual CAD, non  obstructive per 2015 cath. STEMI status post stent in August 2015.   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   Promus DES to RCA with STEMI   Shortness of breath    Past Surgical History:  Procedure Laterality Date   CATARACT EXTRACTION  2020   right and left eye    CHOLECYSTECTOMY     thinks her appendix was removed at the same time  CORONARY ANGIOPLASTY WITH STENT PLACEMENT  02/18/14   Promus DES to RCA   LEFT HEART CATHETERIZATION WITH CORONARY ANGIOGRAM N/A 02/18/2014   Procedure: LEFT HEART CATHETERIZATION WITH CORONARY ANGIOGRAM;  Surgeon: Jettie Booze, MD;  Location: [facility] CATH LAB;  Service: Cardiovascular;  Laterality: N/A;   PTCA  1990   PTCA of RCA   TRANSTHORACIC ECHOCARDIOGRAM  02/02/2012   mild LVH, EF 55-60%, Normal WM, Gr 1 DD; Mild MR   FAMILY HISTORY Family History  Problem Relation Age of Onset   Heart attack Mother 23   Cancer Father 70   Cancer Maternal Grandfather    Cancer Paternal Grandmother    Cancer Paternal Grandfather    Diabetes Son    Liver cancer Brother    Bone cancer Brother    Heart attack Son    Hypertension Son    Diabetes Son    Hyperlipidemia Son    Colon cancer Neg Hx    Ovarian cancer Neg Hx    Uterine cancer Neg Hx    SOCIAL HISTORY Social History   Tobacco Use   Smoking status: Former    Packs/day: 1.00    Years: 55.00    Additional pack years: 0.00    Total pack years: 55.00    Types: Cigarettes    Quit date: 09/29/2006    Years since quitting: 16.0   Smokeless tobacco: Never  Vaping Use   Vaping Use: Never used  Substance Use Topics   Alcohol use: No   Drug use: No       OPHTHALMIC EXAM:  Base Eye  Exam     Visual Acuity (Snellen - Linear)       Right Left   Dist [place] 20/30 -2 20/40 -2         Tonometry (Tonopen, 2:45 PM)       Right Left   Pressure 11 15         Pupils       Dark Light Shape React APD   Right 3 2 Round Brisk None   Left 3 2 Round Brisk None         Visual Fields (Counting fingers)       Left Right    Full Full         Extraocular Movement       Right Left    Full, Ortho Full, Ortho         Neuro/Psych     Oriented x3: Yes   Mood/Affect: Normal         Dilation     Both eyes: 1.0% Mydriacyl, 2.5% Phenylephrine @ 2:45 PM           Slit Lamp and Fundus Exam     Slit Lamp Exam       Right Left   Lids/Lashes Dermatochalasis - lower lid, mild ptosis Dermatochalasis - lower lid, mild Ptosis   Conjunctiva/Sclera Trace Injection White and quiet   Cornea Arcus, well healed cataract wound Mild Arcus, trace Punctate epithelial erosions, mild tear film debris   Anterior Chamber Deep and quiet, no cell or flare Deep and quiet, no cell or flare   Iris Round and dilated, No NVI Round and dilated, No NVI   Lens PC IOL in good position, open PC PC IOL in good position, open PC   Anterior Vitreous Vitreous syneresis, Posterior vitreous detachment Vitreous syneresis, Posterior vitreous detachment         Fundus Exam  Right Left   Disc trace Pallor, mild temporal Peripapillary atrophy trace Pallor, mild temporal Peripapillary atrophy   C/D Ratio 0.4 0.4   Macula Flat, Blunted foveal reflex, RPE mottling and clumping, No heme or edema Flat, good foveal reflex, RPE mottling and clumping, No heme or edema   Vessels attenuated, mild tortuosity attenuated, Tortuous   Periphery Attached, reticular degeneration, rare MA, no RT/RD Attached, reticular degeneration, no heme, no RT/RD           IMAGING AND PROCEDURES  Imaging and Procedures for @TODAY @  OCT, Retina - OU - Both Eyes       Right Eye Quality was good. Central  Foveal Thickness: 256. Progression has been stable. Findings include normal foveal contour, no IRF, no SRF.   Left Eye Quality was borderline. Central Foveal Thickness: 254. Progression has been stable. Findings include normal foveal contour, no IRF, no SRF.   Notes *Images captured and stored on drive  Diagnosis / Impression:  NFP, no IRF/SRF OU -- stable from prior No DME OU  Clinical management:  See below  Abbreviations: NFP - Normal foveal profile. CME - cystoid macular edema. PED - pigment epithelial detachment. IRF - intraretinal fluid. SRF - subretinal fluid. EZ - ellipsoid zone. ERM - epiretinal membrane. ORA - outer retinal atrophy. ORT - outer retinal tubulation. SRHM - subretinal hyper-reflective material             ASSESSMENT/PLAN:    ICD-10-CM   1. Diabetes mellitus type 2 without retinopathy ([place])  E11.9 OCT, Retina - OU - Both Eyes    2. Essential hypertension  I10     3. Hypertensive retinopathy of both eyes  H35.033     4. Pseudophakia of both eyes  Z96.1     5. PCO (posterior capsular opacification), bilateral  H26.493      1. Diabetes mellitus, type 2 without retinopathy  - last A1c was 6.6 on 02.02.24  - The incidence, risk factors for progression, natural history and treatment options for diabetic retinopathy  were discussed with patient.    - The need for close monitoring of blood glucose, blood pressure, and serum lipids, avoiding cigarette or any type of tobacco, and the need for long term follow up was also discussed with patient.  - f/u in 1 year, sooner prn  2,3. Hypertensive retinopathy OU  - discussed importance of tight BP control  - continue to monitor  4. Pseudophakia OU  - s/p CE/IOL OU -- pt unable to remember surgeon  - IOLs in good position  - s/p YAG cap OD (09.16.20), BCVA OD 20/30 improved from 20/200  - s/p YAG cap OS (09.30.20)  Ophthalmic Meds Ordered this visit:  No orders of the defined types were placed in this  encounter.    Return in about 1 year (around 10/04/2023) for f/u DM exam, DFE, OCT.  There are no Patient Instructions on file for this visit.   Explained the diagnoses, plan, and follow up with the patient and they expressed understanding.  Patient expressed understanding of the importance of proper follow up care.   This document serves as a record of services personally performed by Gardiner Sleeper, MD, PhD. It was created on their behalf by Renaldo Reel, [place] an ophthalmic technician. The creation of this record is the provider's dictation and/or activities during the visit.    Electronically signed by:  Renaldo Reel, COT  03.05.24 10:52 PM  This document serves as a record of services  personally performed by Gardiner Sleeper, MD, PhD. It was created on their behalf by San Jetty. Owens Shark, OA an ophthalmic technician. The creation of this record is the provider's dictation and/or activities during the visit.    Electronically signed by: San Jetty. Marguerita Merles 03.19.2024 10:52 PM  Gardiner Sleeper, M.D., Ph.D. Diseases & Surgery of the Retina and Vitreous [facility]  I have reviewed the above documentation for accuracy and completeness, and I agree with the above. Gardiner Sleeper, M.D., Ph.D. 10/06/22 10:54 PM    Abbreviations: M myopia (nearsighted); A astigmatism; H hyperopia (farsighted); P presbyopia; Mrx spectacle prescription;  CTL contact lenses; OD right eye; OS left eye; OU both eyes  XT exotropia; ET esotropia; PEK punctate epithelial keratitis; PEE punctate epithelial erosions; DES dry eye syndrome; MGD meibomian gland dysfunction; ATs artificial tears; PFAT's preservative free artificial tears; [place] nuclear sclerotic cataract; PSC posterior subcapsular cataract; ERM epi-retinal membrane; PVD posterior vitreous detachment; RD retinal detachment; DM diabetes mellitus; DR diabetic retinopathy; NPDR non-proliferative diabetic retinopathy; PDR  proliferative diabetic retinopathy; CSME clinically significant macular edema; DME diabetic macular edema; dbh dot blot hemorrhages; CWS cotton wool spot; POAG primary open angle glaucoma; C/D cup-to-disc ratio; HVF humphrey visual field; GVF goldmann visual field; OCT optical coherence tomography; IOP intraocular pressure; BRVO Branch retinal vein occlusion; CRVO central retinal vein occlusion; CRAO central retinal artery occlusion; BRAO branch retinal artery occlusion; RT retinal tear; SB scleral buckle; PPV pars plana vitrectomy; VH Vitreous hemorrhage; PRP panretinal laser photocoagulation; IVK intravitreal kenalog; VMT vitreomacular traction; MH Macular hole;  NVD neovascularization of the disc; NVE neovascularization elsewhere; AREDS age related eye disease study; ARMD age related macular degeneration; POAG primary open angle glaucoma; EBMD epithelial/anterior basement membrane dystrophy; ACIOL anterior chamber intraocular lens; IOL intraocular lens; PCIOL posterior chamber intraocular lens; Phaco/IOL phacoemulsification with intraocular lens placement; [place] photorefractive keratectomy; LASIK laser assisted in situ keratomileusis; HTN hypertension; DM diabetes mellitus; COPD chronic obstructive pulmonary disease

## 2022-09-21 ENCOUNTER — Telehealth: Payer: Self-pay | Admitting: Cardiovascular Disease

## 2022-09-21 NOTE — Telephone Encounter (Signed)
[name] is requesting to speak with sleep coordinator for updates on CPAP machine order. He states he has been having issues with receiving the order for about a year. He spoke with someone about it a few weeks ago and was told that we would contact him within a week with updates, but he hasn't heard from anyone.

## 2022-09-21 NOTE — Telephone Encounter (Signed)
Will forward to sleep pool to reach out

## 2022-09-22 NOTE — Telephone Encounter (Signed)
Michelle [name], CMA  to Me    09/22/22  3:40 PM I don't know who this man spoke with. Dr Claiborne Billings nor I have had any contact with this patient or anyone since 2022. He must have the wrong office.  Spoke with son and he is still waiting on CPAP machine since 12/2020 and has spoken with 4 different companies and not received   Will route back to sleep pool to reach out

## 2022-09-26 DIAGNOSIS — I509 Heart failure, unspecified: Secondary | ICD-10-CM | POA: Diagnosis not present

## 2022-09-26 DIAGNOSIS — E785 Hyperlipidemia, unspecified: Secondary | ICD-10-CM | POA: Diagnosis not present

## 2022-09-26 DIAGNOSIS — E119 Type 2 diabetes mellitus without complications: Secondary | ICD-10-CM | POA: Diagnosis not present

## 2022-09-26 DIAGNOSIS — M1990 Unspecified osteoarthritis, unspecified site: Secondary | ICD-10-CM | POA: Diagnosis not present

## 2022-09-26 DIAGNOSIS — I440 Atrioventricular block, first degree: Secondary | ICD-10-CM | POA: Diagnosis not present

## 2022-09-26 DIAGNOSIS — I110 Hypertensive heart disease with heart failure: Secondary | ICD-10-CM | POA: Diagnosis not present

## 2022-09-26 DIAGNOSIS — I2510 Atherosclerotic heart disease of native coronary artery without angina pectoris: Secondary | ICD-10-CM | POA: Diagnosis not present

## 2022-09-26 DIAGNOSIS — F0394 Unspecified dementia, unspecified severity, with anxiety: Secondary | ICD-10-CM | POA: Diagnosis not present

## 2022-09-26 DIAGNOSIS — J449 Chronic obstructive pulmonary disease, unspecified: Secondary | ICD-10-CM | POA: Diagnosis not present

## 2022-09-27 ENCOUNTER — Other Ambulatory Visit: Payer: Self-pay | Admitting: Family Medicine

## 2022-09-27 DIAGNOSIS — I440 Atrioventricular block, first degree: Secondary | ICD-10-CM | POA: Diagnosis not present

## 2022-09-27 DIAGNOSIS — I2510 Atherosclerotic heart disease of native coronary artery without angina pectoris: Secondary | ICD-10-CM | POA: Diagnosis not present

## 2022-09-27 DIAGNOSIS — I509 Heart failure, unspecified: Secondary | ICD-10-CM | POA: Diagnosis not present

## 2022-09-27 DIAGNOSIS — M1990 Unspecified osteoarthritis, unspecified site: Secondary | ICD-10-CM | POA: Diagnosis not present

## 2022-09-27 DIAGNOSIS — I110 Hypertensive heart disease with heart failure: Secondary | ICD-10-CM | POA: Diagnosis not present

## 2022-09-27 DIAGNOSIS — F0394 Unspecified dementia, unspecified severity, with anxiety: Secondary | ICD-10-CM | POA: Diagnosis not present

## 2022-09-27 DIAGNOSIS — E119 Type 2 diabetes mellitus without complications: Secondary | ICD-10-CM | POA: Diagnosis not present

## 2022-09-27 DIAGNOSIS — E785 Hyperlipidemia, unspecified: Secondary | ICD-10-CM | POA: Diagnosis not present

## 2022-09-27 DIAGNOSIS — J449 Chronic obstructive pulmonary disease, unspecified: Secondary | ICD-10-CM | POA: Diagnosis not present

## 2022-09-27 NOTE — Telephone Encounter (Signed)
Rx refill request approved per Dr. Corey's orders. 

## 2022-09-28 DIAGNOSIS — I10 Essential (primary) hypertension: Secondary | ICD-10-CM | POA: Diagnosis not present

## 2022-09-28 DIAGNOSIS — J449 Chronic obstructive pulmonary disease, unspecified: Secondary | ICD-10-CM | POA: Diagnosis not present

## 2022-09-28 DIAGNOSIS — E119 Type 2 diabetes mellitus without complications: Secondary | ICD-10-CM | POA: Diagnosis not present

## 2022-09-28 DIAGNOSIS — I440 Atrioventricular block, first degree: Secondary | ICD-10-CM | POA: Diagnosis not present

## 2022-09-28 DIAGNOSIS — E785 Hyperlipidemia, unspecified: Secondary | ICD-10-CM | POA: Diagnosis not present

## 2022-09-28 DIAGNOSIS — I2510 Atherosclerotic heart disease of native coronary artery without angina pectoris: Secondary | ICD-10-CM | POA: Diagnosis not present

## 2022-09-28 DIAGNOSIS — F0394 Unspecified dementia, unspecified severity, with anxiety: Secondary | ICD-10-CM | POA: Diagnosis not present

## 2022-09-28 DIAGNOSIS — I110 Hypertensive heart disease with heart failure: Secondary | ICD-10-CM | POA: Diagnosis not present

## 2022-09-28 DIAGNOSIS — I509 Heart failure, unspecified: Secondary | ICD-10-CM | POA: Diagnosis not present

## 2022-09-28 DIAGNOSIS — M1990 Unspecified osteoarthritis, unspecified site: Secondary | ICD-10-CM | POA: Diagnosis not present

## 2022-10-04 ENCOUNTER — Ambulatory Visit (INDEPENDENT_AMBULATORY_CARE_PROVIDER_SITE_OTHER): Payer: Medicare HMO | Admitting: Ophthalmology

## 2022-10-04 ENCOUNTER — Encounter (INDEPENDENT_AMBULATORY_CARE_PROVIDER_SITE_OTHER): Payer: Self-pay | Admitting: Ophthalmology

## 2022-10-04 DIAGNOSIS — Z961 Presence of intraocular lens: Secondary | ICD-10-CM | POA: Diagnosis not present

## 2022-10-04 DIAGNOSIS — H35033 Hypertensive retinopathy, bilateral: Secondary | ICD-10-CM

## 2022-10-04 DIAGNOSIS — I10 Essential (primary) hypertension: Secondary | ICD-10-CM | POA: Diagnosis not present

## 2022-10-04 DIAGNOSIS — I5032 Chronic diastolic (congestive) heart failure: Secondary | ICD-10-CM | POA: Diagnosis not present

## 2022-10-04 DIAGNOSIS — E119 Type 2 diabetes mellitus without complications: Secondary | ICD-10-CM

## 2022-10-04 DIAGNOSIS — H26493 Other secondary cataract, bilateral: Secondary | ICD-10-CM

## 2022-10-04 DIAGNOSIS — Z515 Encounter for palliative care: Secondary | ICD-10-CM | POA: Diagnosis not present

## 2022-10-04 LAB — HM DIABETES EYE EXAM

## 2022-10-05 LAB — CATECHOLAMINES, FRACTIONATED, URINE, 24 HOUR
Dopamine , 24H Ur: 76 ug/24 hr (ref 0–510)
Dopamine, Rand Ur: 56 ug/L
Epinephrine, 24H Ur: <4 ug/24 hr (ref 0–20)
Epinephrine, Rand Ur: <3 ug/L
Norepinephrine, 24H Ur: 24 ug/24 hr (ref 0–135)
Norepinephrine, Rand Ur: 18 ug/L

## 2022-10-05 LAB — METANEPHRINES, URINE, 24 HOUR
Metaneph Total, Ur: 114 ug/L
Metanephrines, 24H Ur: 154 ug/24 hr (ref 36–209)
Normetanephrine, 24H Ur: 473 ug/24 hr (ref 131–612)
Normetanephrine, Ur: 350 ug/L

## 2022-10-06 ENCOUNTER — Encounter (INDEPENDENT_AMBULATORY_CARE_PROVIDER_SITE_OTHER): Payer: Self-pay | Admitting: Ophthalmology

## 2022-10-06 ENCOUNTER — Telehealth: Payer: Self-pay | Admitting: Family Medicine

## 2022-10-06 DIAGNOSIS — M1990 Unspecified osteoarthritis, unspecified site: Secondary | ICD-10-CM | POA: Diagnosis not present

## 2022-10-06 DIAGNOSIS — E119 Type 2 diabetes mellitus without complications: Secondary | ICD-10-CM | POA: Diagnosis not present

## 2022-10-06 DIAGNOSIS — J449 Chronic obstructive pulmonary disease, unspecified: Secondary | ICD-10-CM | POA: Diagnosis not present

## 2022-10-06 DIAGNOSIS — I509 Heart failure, unspecified: Secondary | ICD-10-CM | POA: Diagnosis not present

## 2022-10-06 DIAGNOSIS — F0394 Unspecified dementia, unspecified severity, with anxiety: Secondary | ICD-10-CM | POA: Diagnosis not present

## 2022-10-06 DIAGNOSIS — E785 Hyperlipidemia, unspecified: Secondary | ICD-10-CM | POA: Diagnosis not present

## 2022-10-06 DIAGNOSIS — I2510 Atherosclerotic heart disease of native coronary artery without angina pectoris: Secondary | ICD-10-CM | POA: Diagnosis not present

## 2022-10-06 DIAGNOSIS — I110 Hypertensive heart disease with heart failure: Secondary | ICD-10-CM | POA: Diagnosis not present

## 2022-10-06 DIAGNOSIS — I440 Atrioventricular block, first degree: Secondary | ICD-10-CM | POA: Diagnosis not present

## 2022-10-06 NOTE — Telephone Encounter (Signed)
Called and spoke with [facility] and she said the incentive spirometer can be sent as Rx to pt pharmacy. Pt was stable during therapy and was able to make it through.

## 2022-10-06 NOTE — Telephone Encounter (Signed)
No call made by sleep at this time, another message sent

## 2022-10-06 NOTE — Telephone Encounter (Signed)
Princess-PTA with [facility] states Patient has a cough with green sputum, oxygen saturation = 97, fatigues quickly, however recovers well.  Calling for recommendation. Recommending incentive spirometer.

## 2022-10-06 NOTE — Telephone Encounter (Signed)
She has known COPD-you can certainly call in an incentive spirometer through the company or through place like [facility]-if she is having worsening symptoms certainly we can try to work her in

## 2022-10-06 NOTE — Telephone Encounter (Signed)
Do not see order in epic when I looked- you can write on notepad and stamp- diagnosis can be cough and/or COPD- and fax

## 2022-10-06 NOTE — Telephone Encounter (Signed)
Do you want to work pt in?

## 2022-10-07 NOTE — Telephone Encounter (Signed)
Rx has been faxed to [facility].

## 2022-10-08 ENCOUNTER — Encounter (HOSPITAL_BASED_OUTPATIENT_CLINIC_OR_DEPARTMENT_OTHER): Payer: Self-pay | Admitting: Cardiovascular Disease

## 2022-10-09 DIAGNOSIS — R0602 Shortness of breath: Secondary | ICD-10-CM | POA: Diagnosis not present

## 2022-10-11 NOTE — Telephone Encounter (Signed)
Could someone please call the patient/family to check in on bleeding, other symptoms? Thank you

## 2022-10-12 ENCOUNTER — Telehealth: Payer: Self-pay

## 2022-10-12 DIAGNOSIS — I440 Atrioventricular block, first degree: Secondary | ICD-10-CM | POA: Diagnosis not present

## 2022-10-12 DIAGNOSIS — E119 Type 2 diabetes mellitus without complications: Secondary | ICD-10-CM | POA: Diagnosis not present

## 2022-10-12 DIAGNOSIS — E785 Hyperlipidemia, unspecified: Secondary | ICD-10-CM | POA: Diagnosis not present

## 2022-10-12 DIAGNOSIS — I509 Heart failure, unspecified: Secondary | ICD-10-CM | POA: Diagnosis not present

## 2022-10-12 DIAGNOSIS — J449 Chronic obstructive pulmonary disease, unspecified: Secondary | ICD-10-CM | POA: Diagnosis not present

## 2022-10-12 DIAGNOSIS — I2510 Atherosclerotic heart disease of native coronary artery without angina pectoris: Secondary | ICD-10-CM | POA: Diagnosis not present

## 2022-10-12 DIAGNOSIS — I110 Hypertensive heart disease with heart failure: Secondary | ICD-10-CM | POA: Diagnosis not present

## 2022-10-12 DIAGNOSIS — F0394 Unspecified dementia, unspecified severity, with anxiety: Secondary | ICD-10-CM | POA: Diagnosis not present

## 2022-10-12 DIAGNOSIS — M1990 Unspecified osteoarthritis, unspecified site: Secondary | ICD-10-CM | POA: Diagnosis not present

## 2022-10-12 NOTE — Telephone Encounter (Signed)
Per Dr.Tucker's request, I called to follow up on [name]. Her son Michelle [name]) answered and states  mom is doing ok bleeding stopped after 4 days of of taking medication. Not other issues at this time. He states he will call with questions and concerns.  Dr. Berline Lopes notified.

## 2022-10-12 NOTE — Telephone Encounter (Signed)
Thank you :)

## 2022-10-13 ENCOUNTER — Other Ambulatory Visit: Payer: Self-pay | Admitting: Family Medicine

## 2022-10-13 DIAGNOSIS — E119 Type 2 diabetes mellitus without complications: Secondary | ICD-10-CM | POA: Diagnosis not present

## 2022-10-13 DIAGNOSIS — E785 Hyperlipidemia, unspecified: Secondary | ICD-10-CM | POA: Diagnosis not present

## 2022-10-13 DIAGNOSIS — F0394 Unspecified dementia, unspecified severity, with anxiety: Secondary | ICD-10-CM | POA: Diagnosis not present

## 2022-10-13 DIAGNOSIS — I440 Atrioventricular block, first degree: Secondary | ICD-10-CM | POA: Diagnosis not present

## 2022-10-13 DIAGNOSIS — M1990 Unspecified osteoarthritis, unspecified site: Secondary | ICD-10-CM | POA: Diagnosis not present

## 2022-10-13 DIAGNOSIS — J449 Chronic obstructive pulmonary disease, unspecified: Secondary | ICD-10-CM | POA: Diagnosis not present

## 2022-10-13 DIAGNOSIS — I2510 Atherosclerotic heart disease of native coronary artery without angina pectoris: Secondary | ICD-10-CM | POA: Diagnosis not present

## 2022-10-13 DIAGNOSIS — I509 Heart failure, unspecified: Secondary | ICD-10-CM | POA: Diagnosis not present

## 2022-10-13 DIAGNOSIS — I110 Hypertensive heart disease with heart failure: Secondary | ICD-10-CM | POA: Diagnosis not present

## 2022-10-17 DIAGNOSIS — I2510 Atherosclerotic heart disease of native coronary artery without angina pectoris: Secondary | ICD-10-CM | POA: Diagnosis not present

## 2022-10-17 DIAGNOSIS — I440 Atrioventricular block, first degree: Secondary | ICD-10-CM | POA: Diagnosis not present

## 2022-10-17 DIAGNOSIS — E119 Type 2 diabetes mellitus without complications: Secondary | ICD-10-CM | POA: Diagnosis not present

## 2022-10-17 DIAGNOSIS — I509 Heart failure, unspecified: Secondary | ICD-10-CM | POA: Diagnosis not present

## 2022-10-17 DIAGNOSIS — E785 Hyperlipidemia, unspecified: Secondary | ICD-10-CM | POA: Diagnosis not present

## 2022-10-17 DIAGNOSIS — M1990 Unspecified osteoarthritis, unspecified site: Secondary | ICD-10-CM | POA: Diagnosis not present

## 2022-10-17 DIAGNOSIS — I110 Hypertensive heart disease with heart failure: Secondary | ICD-10-CM | POA: Diagnosis not present

## 2022-10-17 DIAGNOSIS — F0394 Unspecified dementia, unspecified severity, with anxiety: Secondary | ICD-10-CM | POA: Diagnosis not present

## 2022-10-17 DIAGNOSIS — J449 Chronic obstructive pulmonary disease, unspecified: Secondary | ICD-10-CM | POA: Diagnosis not present

## 2022-10-19 DIAGNOSIS — E119 Type 2 diabetes mellitus without complications: Secondary | ICD-10-CM | POA: Diagnosis not present

## 2022-10-19 DIAGNOSIS — I110 Hypertensive heart disease with heart failure: Secondary | ICD-10-CM | POA: Diagnosis not present

## 2022-10-19 DIAGNOSIS — F0394 Unspecified dementia, unspecified severity, with anxiety: Secondary | ICD-10-CM | POA: Diagnosis not present

## 2022-10-19 DIAGNOSIS — I2510 Atherosclerotic heart disease of native coronary artery without angina pectoris: Secondary | ICD-10-CM | POA: Diagnosis not present

## 2022-10-19 DIAGNOSIS — J449 Chronic obstructive pulmonary disease, unspecified: Secondary | ICD-10-CM | POA: Diagnosis not present

## 2022-10-19 DIAGNOSIS — I509 Heart failure, unspecified: Secondary | ICD-10-CM | POA: Diagnosis not present

## 2022-10-19 DIAGNOSIS — E785 Hyperlipidemia, unspecified: Secondary | ICD-10-CM | POA: Diagnosis not present

## 2022-10-19 DIAGNOSIS — M1990 Unspecified osteoarthritis, unspecified site: Secondary | ICD-10-CM | POA: Diagnosis not present

## 2022-10-19 DIAGNOSIS — I440 Atrioventricular block, first degree: Secondary | ICD-10-CM | POA: Diagnosis not present

## 2022-10-21 NOTE — Telephone Encounter (Signed)
Returned a call to patient's son and informed him that I checked into why his mom had not gotten her CPAP machine. I was told by Jasmine December at [facility] that the order was sent by her to [facility]  in November 2023 because [facility] did not take [facility]. Fax Confirmation was received. I then called Henderson Newcomer with [facility] and was told they spoke with the son on 04/06/22 informing him they needed to speak with the patient to get permission to speak with him. He was not listed on her DPR. They needed some information in order to process the order. Note in chart per Melissa states the patient's son got angry and hung up the phone on them. No contact was made after this incident. Son has been informed that at this point she will need to start the process completely over with having a new sleep study. He states that he will discuss this with her, however she has already voiced to him that she will not redo a sleep study. [name] informed to let us know if they would like to proceed with new sleep study in the future.

## 2022-10-24 DIAGNOSIS — I509 Heart failure, unspecified: Secondary | ICD-10-CM | POA: Diagnosis not present

## 2022-10-24 DIAGNOSIS — I110 Hypertensive heart disease with heart failure: Secondary | ICD-10-CM | POA: Diagnosis not present

## 2022-10-24 DIAGNOSIS — F0394 Unspecified dementia, unspecified severity, with anxiety: Secondary | ICD-10-CM | POA: Diagnosis not present

## 2022-10-24 DIAGNOSIS — E119 Type 2 diabetes mellitus without complications: Secondary | ICD-10-CM | POA: Diagnosis not present

## 2022-10-24 DIAGNOSIS — I2510 Atherosclerotic heart disease of native coronary artery without angina pectoris: Secondary | ICD-10-CM | POA: Diagnosis not present

## 2022-10-24 DIAGNOSIS — I440 Atrioventricular block, first degree: Secondary | ICD-10-CM | POA: Diagnosis not present

## 2022-10-24 DIAGNOSIS — J449 Chronic obstructive pulmonary disease, unspecified: Secondary | ICD-10-CM | POA: Diagnosis not present

## 2022-10-24 DIAGNOSIS — M1990 Unspecified osteoarthritis, unspecified site: Secondary | ICD-10-CM | POA: Diagnosis not present

## 2022-10-24 DIAGNOSIS — E785 Hyperlipidemia, unspecified: Secondary | ICD-10-CM | POA: Diagnosis not present

## 2022-10-31 ENCOUNTER — Telehealth: Payer: Self-pay | Admitting: Family Medicine

## 2022-10-31 NOTE — Telephone Encounter (Signed)
Last Zilretta inj BILAT knee OA: 08/16/22 Can consider repeat 11/09/22

## 2022-10-31 NOTE — Telephone Encounter (Signed)
[name] called asking if we could authorize Zilretta for the patient? Last given 08/16/22.

## 2022-11-01 ENCOUNTER — Encounter: Payer: Medicare HMO | Admitting: Pharmacist

## 2022-11-01 NOTE — Telephone Encounter (Signed)
VOB initiated for Zilretta for BILAT knee OA 

## 2022-11-02 NOTE — Telephone Encounter (Signed)
Prior auth required for [facility] for BILAT knee OA

## 2022-11-02 NOTE — Telephone Encounter (Signed)
Prior Authorization initiated for [facility] via [email or web address] KEY: [id number]

## 2022-11-03 ENCOUNTER — Other Ambulatory Visit: Payer: Self-pay | Admitting: Family Medicine

## 2022-11-03 NOTE — Telephone Encounter (Signed)
Zilretta for BILAT knee OA   Primary Insurance: [employer] Medicare Adv Co-Pay: $15 Co-insurance: 20% - approximately $223.88 Deductible: does not apply  Prior Auth: APPROVED PA# [id number] Valid: 08/08/22-11/07/22

## 2022-11-03 NOTE — Telephone Encounter (Addendum)
Prior Auth. APPROVED

## 2022-11-04 DIAGNOSIS — Z515 Encounter for palliative care: Secondary | ICD-10-CM | POA: Diagnosis not present

## 2022-11-04 DIAGNOSIS — I5032 Chronic diastolic (congestive) heart failure: Secondary | ICD-10-CM | POA: Diagnosis not present

## 2022-11-07 ENCOUNTER — Telehealth: Payer: Self-pay | Admitting: Family Medicine

## 2022-11-07 DIAGNOSIS — I110 Hypertensive heart disease with heart failure: Secondary | ICD-10-CM | POA: Diagnosis not present

## 2022-11-07 DIAGNOSIS — F0394 Unspecified dementia, unspecified severity, with anxiety: Secondary | ICD-10-CM | POA: Diagnosis not present

## 2022-11-07 DIAGNOSIS — E785 Hyperlipidemia, unspecified: Secondary | ICD-10-CM | POA: Diagnosis not present

## 2022-11-07 DIAGNOSIS — E119 Type 2 diabetes mellitus without complications: Secondary | ICD-10-CM | POA: Diagnosis not present

## 2022-11-07 DIAGNOSIS — I2510 Atherosclerotic heart disease of native coronary artery without angina pectoris: Secondary | ICD-10-CM | POA: Diagnosis not present

## 2022-11-07 DIAGNOSIS — I509 Heart failure, unspecified: Secondary | ICD-10-CM | POA: Diagnosis not present

## 2022-11-07 DIAGNOSIS — J449 Chronic obstructive pulmonary disease, unspecified: Secondary | ICD-10-CM | POA: Diagnosis not present

## 2022-11-07 DIAGNOSIS — M1990 Unspecified osteoarthritis, unspecified site: Secondary | ICD-10-CM | POA: Diagnosis not present

## 2022-11-07 DIAGNOSIS — I440 Atrioventricular block, first degree: Secondary | ICD-10-CM | POA: Diagnosis not present

## 2022-11-07 NOTE — Telephone Encounter (Signed)
Patient dropped off document Handicap Placard, to be filled out by provider. Patient requested to send it via Call Patient to pick up within 5-days. Document is located in providers tray at front office.Please advise at Mobile [phone] (mobile)

## 2022-11-07 NOTE — Telephone Encounter (Signed)
Completed-to be placed on your desk Ardine Bjork

## 2022-11-07 NOTE — Telephone Encounter (Signed)
Form placed in PCP sign folder.

## 2022-11-07 NOTE — Telephone Encounter (Signed)
PA renewal initiated via CMM KEY:  [id number]

## 2022-11-08 NOTE — Telephone Encounter (Signed)
Ready for scheduling on or after 11/09/22  Zilretta for BILAT knee OA    Primary Insurance: [facility] Medicare Adv Co-Pay: $15 Co-insurance: 20% - approximately $223.88 Deductible: does not apply   Prior Auth: APPROVED  Key: [id number] PA Case ID #: [id number] Valid: 11/08/22-05/10/23

## 2022-11-08 NOTE — Telephone Encounter (Signed)
Form completed and placed up front, called and made son [name] aware.

## 2022-11-08 NOTE — Telephone Encounter (Signed)
Scheduled

## 2022-11-11 DIAGNOSIS — F0394 Unspecified dementia, unspecified severity, with anxiety: Secondary | ICD-10-CM | POA: Diagnosis not present

## 2022-11-11 DIAGNOSIS — I2510 Atherosclerotic heart disease of native coronary artery without angina pectoris: Secondary | ICD-10-CM | POA: Diagnosis not present

## 2022-11-11 DIAGNOSIS — E119 Type 2 diabetes mellitus without complications: Secondary | ICD-10-CM | POA: Diagnosis not present

## 2022-11-11 DIAGNOSIS — I110 Hypertensive heart disease with heart failure: Secondary | ICD-10-CM | POA: Diagnosis not present

## 2022-11-11 DIAGNOSIS — E785 Hyperlipidemia, unspecified: Secondary | ICD-10-CM | POA: Diagnosis not present

## 2022-11-11 DIAGNOSIS — I440 Atrioventricular block, first degree: Secondary | ICD-10-CM | POA: Diagnosis not present

## 2022-11-11 DIAGNOSIS — I509 Heart failure, unspecified: Secondary | ICD-10-CM | POA: Diagnosis not present

## 2022-11-11 DIAGNOSIS — J449 Chronic obstructive pulmonary disease, unspecified: Secondary | ICD-10-CM | POA: Diagnosis not present

## 2022-11-11 DIAGNOSIS — M1990 Unspecified osteoarthritis, unspecified site: Secondary | ICD-10-CM | POA: Diagnosis not present

## 2022-11-14 ENCOUNTER — Other Ambulatory Visit: Payer: Self-pay

## 2022-11-14 ENCOUNTER — Ambulatory Visit: Payer: Medicare HMO | Admitting: Family Medicine

## 2022-11-14 VITALS — BP 96/58 | HR 59 | Ht 65.0 in

## 2022-11-14 DIAGNOSIS — M25561 Pain in right knee: Secondary | ICD-10-CM

## 2022-11-14 DIAGNOSIS — G8929 Other chronic pain: Secondary | ICD-10-CM

## 2022-11-14 DIAGNOSIS — M170 Bilateral primary osteoarthritis of knee: Secondary | ICD-10-CM

## 2022-11-14 DIAGNOSIS — M25562 Pain in left knee: Secondary | ICD-10-CM | POA: Diagnosis not present

## 2022-11-14 MED ORDER — TRIAMCINOLONE ACETONIDE 32 MG IX SRER
32.0000 mg | Freq: Once | INTRA_ARTICULAR | Status: AC
  Administered 2022-11-14: 32 mg via INTRA_ARTICULAR

## 2022-11-14 NOTE — Patient Instructions (Signed)
Thank you for coming in today.   You received an injection today. Seek immediate medical attention if the joint becomes red, extremely painful, or is oozing fluid.  

## 2022-11-14 NOTE — Progress Notes (Unsigned)
   Rubin Payor, PhD, LAT, ATC acting as a scribe for [name], MD.  Michelle [name] is a 89 y.o. female who presents to [facility] today for cont'd bilat knee pain. Pt was last seen by Dr. Denyse Amass on 08/16/22 and was given bilat Zilretta injections. Today, pt presents for repeat bilat Zilretta injections. She reports knee pain returning over the last 3 wks.  Dx imaging: 04/04/22 R & L knee XR             07/13/14 R knee XR  Pertinent review of systems: ***  Relevant historical information: ***   Exam:  There were no vitals taken for this visit. General: Well Developed, well nourished, and in no acute distress.   MSK: ***    Lab and Radiology Results No results found for this or any previous visit (from the past 72 hour(s)). No results found.     Assessment and Plan: 89 y.o. female with ***   PDMP not reviewed this encounter. No orders of the defined types were placed in this encounter.  No orders of the defined types were placed in this encounter.    Discussed warning signs or symptoms. Please see discharge instructions. Patient expresses understanding.   ***

## 2022-11-16 ENCOUNTER — Telehealth: Payer: Self-pay | Admitting: Physician Assistant

## 2022-11-16 NOTE — Telephone Encounter (Signed)
New message    Son [name] is asking for a call back to discuss telephone call with [facility] on yesterday regarding his mother physical therapy.

## 2022-11-16 NOTE — Telephone Encounter (Signed)
This may require another in-office assessment before we can re-order it. Pls have them schedule another appt with Huntley Dec for f/u on continued need for PT, thanks. Sanda Linger. Thanks

## 2022-11-16 NOTE — Telephone Encounter (Signed)
Called patients son he spoke to [facility] who wanted to extend patients Home health care PT. Patients insurance [employer] who is requesting we perform a appeal. I have never done an appeal for a patient to get PT. I have printed the forms but it is requiring a letter and a form filled out by a Dr. Is this something you would want to do

## 2022-11-16 NOTE — Telephone Encounter (Signed)
Called patients son and made them aware of Dr. Elson Clan request to have patient come in for appt with Huntley Dec to complete the paperwork

## 2022-11-17 ENCOUNTER — Ambulatory Visit: Payer: Medicare HMO | Admitting: Physician Assistant

## 2022-11-17 ENCOUNTER — Encounter: Payer: Self-pay | Admitting: Physician Assistant

## 2022-11-17 VITALS — BP 145/70 | HR 94 | Resp 18 | Ht 65.0 in

## 2022-11-17 DIAGNOSIS — R2689 Other abnormalities of gait and mobility: Secondary | ICD-10-CM

## 2022-11-17 DIAGNOSIS — R5381 Other malaise: Secondary | ICD-10-CM

## 2022-11-17 NOTE — Patient Instructions (Signed)
Continue Lamotrigine 25mg  twice a day  2. Continue 24/7 care.  Recommend  more PT for stregth and balance    3. Follow-up  with Dr. Karel Jarvis as scheduled   Feel free to visit Facebook page " [employer]" for tips of how to care for people with memory problems.   Seizure Precautions: 1. If medication has been prescribed for you to prevent seizures, take it exactly as directed.  Do not stop taking the medicine without talking to your doctor first, even if you have not had a seizure in a long time.   2. Avoid activities in which a seizure would cause danger to yourself or to others.  Don't operate dangerous machinery, swim alone, or climb in high or dangerous places, such as on ladders, roofs, or girders.  Do not drive unless your doctor says you may.  3. If you have any warning that you may have a seizure, lay down in a safe place where you can't hurt yourself.    4.  No driving for 6 months from last seizure, as per [facility].   Please refer to the following link on the Epilepsy Foundation of America's website for more information: http://www.epilepsyfoundation.org/answerplace/Social/driving/drivingu.cfm   5.  Maintain good sleep hygiene.  6.  Contact your doctor if you have any problems that may be related to the medicine you are taking.  7.  Call 911 and bring the patient back to the ED if:        A.  The seizure lasts longer than 5 minutes.       B.  The patient doesn't awaken shortly after the seizure  C.  The patient has new problems such as difficulty seeing, speaking or moving  D.  The patient was injured during the seizure  E.  The patient has a temperature over 102 F (39C)  F.  The patient vomited and now is having trouble breathing

## 2022-11-17 NOTE — Progress Notes (Signed)
Assessment/Plan:   Memory Impairment of multiple etiologies  Michelle [name] is a very pleasant 89 y.o. RH female with a history of hypertension, hyperlipidemia, first-degree heart block, DM2, history of postmyocardial infarction pericarditis, COPD, OSA not on CPAP seen today prior to her scheduled visit with Dr. Karel Jarvis in 02/2023 due to increased debility, balance ans strength issues. Patient has completed 5 session of PT but feels that she needs more. She still leans forward on her walker and is unable to walk independently. Her stride is short, her gait is not stable. She cannot walk long distances barely reaching one end of the hallway.  She has some difficulty rising from the chair tot he walker.   Recommend referral to PT at home fas she needs it  for strength and balance Continue using the walker for stability and to prevent falls   Left temporal lobe epilepsy Patient is on lamotrigine 25 mg daily, tolerating well.  No seizure activity was noted. Follow-up with Dr. Karel Jarvis.     VITALS:   Vitals:   11/17/22 1250  BP: (!) 145/70  Pulse: 94  Resp: 18  SpO2: 95%  Height: 5\' 5"  (1.651 m)    GEN:  The patient appears stated age and is in NAD. HEENT:  Normocephalic, atraumatic.   Neurological examination:  General: NAD, well-groomed, appears stated age. Orientation: The patient is alert. Oriented to person, place and date Cranial nerves: There is good facial symmetry.The speech is fluent and clear. No aphasia or dysarthria. Fund of knowledge is appropriate. Recent and remote memory are impaired. Attention and concentration are reduced.  Able to name objects and repeat phrases.  Hearing is intact to conversational tone.                                                                                                                                                                                                                                                                                             Sensation: Sensation is intact to light touch throughout Motor: Strength is at least antigravity R>L 4/5. LLE strength is 5/5. BUE normal strength DTR's 2/4 in  UE/LE     Movement examination: Tone: There is normal tone in the UE/LE. No cogwheeling Abnormal movements: Minimal left greater than right resting tremor.  No myoclonus.  No asterixis.   Coordination:  There is no decremation with RAM's. Normal finger to nose  Gait and Station: The patient has  difficulty arising out of a deep-seated chair without the use of the hands, needs assistance. She leans forward on her walker. The patient's stride length is short.  Gait is cautious and narrow.    Thank you for allowing [place] the opportunity to participate in the care of this nice patient. Please do not hesitate to contact us for any questions or concerns.   Total time spent on today's visit was 36 minutes dedicated to this patient today, preparing to see patient, examining the patient, ordering tests and/or medications and counseling the patient, documenting clinical information in the EHR or other health record, independently interpreting results and communicating results to the patient/family, discussing treatment and goals, answering patient's questions and coordinating care.  Cc:  Shelva Majestic, MD  Marlowe Kays 11/17/2022 1:15 PM

## 2022-11-18 NOTE — Telephone Encounter (Signed)
Pt received Zilretta for BILAT knee OA 11/14/22.  Can consider repeat on or after 02/07/23

## 2022-11-23 ENCOUNTER — Other Ambulatory Visit: Payer: Self-pay | Admitting: Family Medicine

## 2022-11-23 DIAGNOSIS — I2510 Atherosclerotic heart disease of native coronary artery without angina pectoris: Secondary | ICD-10-CM

## 2022-11-24 ENCOUNTER — Other Ambulatory Visit: Payer: Self-pay | Admitting: Family Medicine

## 2022-11-24 NOTE — Telephone Encounter (Signed)
Rx refill request approved per Dr. Corey's orders. 

## 2022-12-05 ENCOUNTER — Telehealth: Payer: Self-pay | Admitting: Family Medicine

## 2022-12-05 DIAGNOSIS — I5032 Chronic diastolic (congestive) heart failure: Secondary | ICD-10-CM | POA: Diagnosis not present

## 2022-12-05 DIAGNOSIS — N183 Chronic kidney disease, stage 3 unspecified: Secondary | ICD-10-CM | POA: Diagnosis not present

## 2022-12-05 DIAGNOSIS — G8929 Other chronic pain: Secondary | ICD-10-CM | POA: Diagnosis not present

## 2022-12-05 DIAGNOSIS — M170 Bilateral primary osteoarthritis of knee: Secondary | ICD-10-CM | POA: Diagnosis not present

## 2022-12-05 DIAGNOSIS — E1122 Type 2 diabetes mellitus with diabetic chronic kidney disease: Secondary | ICD-10-CM | POA: Diagnosis not present

## 2022-12-05 DIAGNOSIS — G40909 Epilepsy, unspecified, not intractable, without status epilepticus: Secondary | ICD-10-CM | POA: Diagnosis not present

## 2022-12-05 DIAGNOSIS — F0284 Dementia in other diseases classified elsewhere, unspecified severity, with anxiety: Secondary | ICD-10-CM | POA: Diagnosis not present

## 2022-12-05 DIAGNOSIS — I130 Hypertensive heart and chronic kidney disease with heart failure and stage 1 through stage 4 chronic kidney disease, or unspecified chronic kidney disease: Secondary | ICD-10-CM | POA: Diagnosis not present

## 2022-12-05 DIAGNOSIS — M545 Low back pain, unspecified: Secondary | ICD-10-CM | POA: Diagnosis not present

## 2022-12-05 NOTE — Telephone Encounter (Signed)
Home Health Verbal Orders  Agency:  [facility]  Caller: Princess  Contact and title  Requesting OT/ PT/ Skilled nursing/ Social Work/ Speech:    Reason for Request:  Report fall may 9th, was reported today, no injury, will continue PT  Frequency:    HH needs F2F w/in last 30 days     [phone]

## 2022-12-05 NOTE — Telephone Encounter (Signed)
Called and lm on Princess vm with VO. ?

## 2022-12-06 ENCOUNTER — Encounter (HOSPITAL_BASED_OUTPATIENT_CLINIC_OR_DEPARTMENT_OTHER): Payer: Self-pay | Admitting: Family

## 2022-12-06 ENCOUNTER — Ambulatory Visit (HOSPITAL_BASED_OUTPATIENT_CLINIC_OR_DEPARTMENT_OTHER): Payer: Medicare HMO | Admitting: Family

## 2022-12-06 VITALS — BP 110/52 | HR 74 | Ht 65.0 in | Wt 167.7 lb

## 2022-12-06 DIAGNOSIS — E782 Mixed hyperlipidemia: Secondary | ICD-10-CM

## 2022-12-06 DIAGNOSIS — I10 Essential (primary) hypertension: Secondary | ICD-10-CM | POA: Diagnosis not present

## 2022-12-06 MED ORDER — CARVEDILOL 12.5 MG PO TABS: 12.5000 mg | ORAL_TABLET | Freq: Two times a day (BID) | ORAL | 3 refills | Status: DC

## 2022-12-06 MED ORDER — ICOSAPENT ETHYL 1 G PO CAPS: 2.0000 g | ORAL_CAPSULE | Freq: Two times a day (BID) | ORAL | 3 refills | Status: DC

## 2022-12-06 NOTE — Patient Instructions (Signed)
Medication Instructions:  Your physician recommends that you continue on your current medications as directed. Please refer to the Current Medication list given to you today.   Labwork: NONE  Testing/Procedures: NONE  Follow-Up: 4-6 MONTHS WITH EITHER DR Franklin OR CATILIN IN ADV HTN   Any Other Special Instructions Will Be Listed Below (If Applicable). CONTINUE TO HOLD HYDRALAZINE IF BLOOD PRESSURE BELOW 110  If you need a refill on your cardiac medications before your next appointment, please call your pharmacy.

## 2022-12-06 NOTE — Progress Notes (Unsigned)
Advanced Hypertension Clinic Assessment:    Date:  12/07/2022   ID:  Michelle [name], DOB 06/23/1933, MRN [id number]  PCP:  Shelva Majestic, MD  Cardiologist:  Verne Carrow, MD  Nephrologist:  Referring MD: Shelva Majestic, MD   CC: Hypertension  History of Present Illness:    Michelle [name] is a 89 y.o. female with a hx of CAD (angioplasty of RCA 1990, inferior STEMI 02/2014 RCA treated with 2 covered stents), DM 2, HTN, HLD, prior tobacco use, chronic diastolic heart failure.  Presents today for follow-up on hypertension clinic.   Established with advanced hypertension clinic February 2022.  Admitted 08/2020 with confusion BP 186/81, hypertensive encephalopathy.  Amlodipine, enalapril, spironolactone continued.  Metoprolol increased and hydralazine added.  Furosemide reduced due to concern for dehydration.  Cortisol normal limits.  Renal artery Dopplers 07/2020 unremarkable.  At visit 03/2022 due to labile blood pressure hydralazine adjusted to 50 mg a.m., 25 mg afternoon, 50 mg p.m.  Every 07/02/2022 she noted blood pressure was overall well-controlled at home as recommended continue same regimen.  Virtual visit with Dr. Duke Salvia 09/06/2022 with blood pressure continuing to be very labile but overall controlled.  She was instructed if blood pressure was running low to reduce hydralazine or amlodipine dose.  Urine catecholamines and metanephrines were ordered and unremarkable.    Presents today for follow up with her son. Spends most of her day in the chair. Notes her knees give her trouble which has improved after injections - most recent injection 3 weeks ago. Reports rare episodes rare episodes of shortness of breath. Son notices it most with exertion as she is overall sedentary. Reports no chest pain, pressure, or tightness. No edema, orthopnea, PND. Reports no palpitations.    BP morning, 2pm, 8pm. At her midday check if >110 she takes Hydralazine or if <110 will skip. By  evening if she does not check will have SBP 150-160s. This occurs about once per week. No SBP >170. BP range 103/51-167/81 with most SBP 120s.  Past Medical History:  Diagnosis Date   Anginal pain (HCC)    Arthritis    AV block, 1st degree    CAD (coronary artery disease) 1990; 2015   Cardiac cath 1990 with Dr. Riley Kill and [name] Residual CAD, non obstructive per 2015 cath. STEMI status post stent in August 2015.   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   Promus DES to RCA with STEMI   Shortness of breath     Past Surgical History:  Procedure Laterality Date   CATARACT EXTRACTION  2020   right and left eye    CHOLECYSTECTOMY     thinks her appendix was removed at the same time   CORONARY ANGIOPLASTY WITH STENT PLACEMENT  02/18/14   Promus DES to RCA   LEFT HEART CATHETERIZATION WITH CORONARY ANGIOGRAM N/A 02/18/2014   Procedure: LEFT HEART CATHETERIZATION WITH CORONARY ANGIOGRAM;  Surgeon: Corky Crafts, MD;  Location: [facility] C[facility] LAB;  Service: Cardiovascular;  Laterality: N/A;  PTCA  1990   PTCA of RCA   TRANSTHORACIC ECHOCARDIOGRAM  02/02/2012   mild LVH, EF 55-60%, Normal WM, Gr 1 DD; Mild MR    Current Medications: Current Meds  Medication Sig   acetaminophen (TYLENOL) 650 MG CR tablet Take 1,300 mg by mouth 3 (three) times daily. Patient take 3 times a day.   albuterol (PROVENTIL) (2.5 MG/3ML) 0.083% nebulizer solution USE THREE MILLILITERS VIA NEBULIZATION BY MOUTH EVERY 6 HOURS AS NEEDED FOR WHEEZING OR  SHORTNESS OF BREATH   albuterol (VENTOLIN HFA) 108 (90 Base) MCG/ACT inhaler INHALE 1 PUFF EVERY 4 HOURS AS NEEDED FOR WHEEZING, SHORTNESS OF BREATH (RESCUE INHALER IF ADVAIR NOT WORKING)   Alcohol Swabs (DROPSAFE ALCOHOL PREP) 70 % PADS USE FOR TESTING THREE TIMES DAILY   amLODipine (NORVASC) 2.5 MG tablet Take 2.5 mg in the morning. If afternoon blood pressure >145/90 take second tablet of 2.5   aspirin 81 MG tablet Take 81 mg by mouth daily with breakfast.   Blood Glucose Calibration (ACCU-CHEK SMARTVIEW CONTROL) LIQD    blood glucose meter kit and supplies KIT Dispense based on patient and insurance preference. Use up to four times daily as directed. Dx:E11.9   calcium carbonate (OSCAL) 1500 (600 Ca) MG TABS tablet Take 600 mg of elemental calcium by mouth daily with breakfast.   cholecalciferol (VITAMIN D3) 25 MCG (1000 UNIT) tablet Take 1,000 Units by mouth daily with breakfast.   clopidogrel (PLAVIX) 75 MG tablet TAKE 1 TABLET EVERY DAY   diclofenac Sodium (VOLTAREN) 1 % GEL APPLY 2 GRAMS TOPICALLY TO AFFECTED AREA FOUR TIMES A DAY   DROPLET PEN NEEDLES 31G X 8 MM MISC USE AS DIRECTED WITH LANTUS SOLOSTAR PEN   enalapril (VASOTEC) 20 MG tablet TAKE 1 TABLET TWICE DAILY   fenofibrate micronized (LOFIBRA) 134 MG capsule TAKE 1 CAPSULE EVERY DAY   ferrous sulfate 325 (65 FE) MG tablet Twice a week (Patient taking differently: Take 325 mg by mouth 2 (two) times a week.)   fluticasone-salmeterol (ADVAIR) 250-50 MCG/ACT AEPB INHALE 1 PUFF TWICE DAILY   folic acid (FOLVITE) 400 MCG tablet Take 400 mcg by mouth daily with breakfast.   furosemide (LASIX) 20 MG tablet TAKE 1 TABLET EVERY DAY   hydrALAZINE (APRESOLINE) 50 MG tablet Take 1 tablet (50 mg total) by mouth 3 (three) times daily.   lamoTRIgine (LAMICTAL) 25 MG tablet TAKE 1 TABLET TWICE DAILY   Lancets Misc. (ACCU-CHEK FASTCLIX LANCET) KIT 1 Device by Does not apply route. Use as directed for testing blood sugar   Lancets Misc. (ACCU-CHEK  FASTCLIX LANCET) KIT Use as directed to test blood sugar   LANTUS SOLOSTAR 100 UNIT/ML Solostar Pen INJECT  17  TO  20 UNITS SUBCUTANEOUSLY EVERY MORNING   loratadine (CLARITIN) 10 MG tablet Take 10 mg by mouth every other day.   medroxyPROGESTERone (PROVERA) 10 MG tablet Take 1 tablet (10 mg total) by mouth daily.   Melatonin 5 MG CAPS Take 5 mg by mouth at bedtime.   metFORMIN (GLUCOPHAGE-XR) 500 MG 24 hr tablet TAKE 1 TABLET (500 MG TOTAL) BY MOUTH IN THE MORNING AND AT BEDTIME.   nitroGLYCERIN (NITROSTAT) 0.4 MG SL tablet DISSOLVE 1 TAB UNDER TONGUE FOR CHEST PAIN - IF PAIN REMAINS AFTER 5 MIN, CALL 911 AND REPEAT DOSE. MAX 3 TABS IN 15 MINUTES   nortriptyline (PAMELOR) 10 MG capsule TAKE 1 CAPSULE BY MOUTH AT BEDTIME AS NEEDED FOR PAIN PREVENTION   pantoprazole (PROTONIX) 40 MG tablet TAKE 1  TABLET EVERY DAY   rosuvastatin (CRESTOR) 40 MG tablet TAKE 1 TABLET EVERY DAY   sertraline (ZOLOFT) 50 MG tablet TAKE 2 TABLETS AT BEDTIME   SPIRIVA HANDIHALER 18 MCG inhalation capsule PLACE 1 CAPSULE INTO HANDIHALER AND INHALE THE CONTENTS DAILY   spironolactone (ALDACTONE) 25 MG tablet TAKE 1 TABLET EVERY DAY   TRUE METRIX BLOOD GLUCOSE TEST test strip TEST BLOOD SUGAR UP TO FOUR TIMES DAILY AS DIRECTED   TRUEplus Lancets 33G MISC TEST BLOOD SUGAR UP TO FOUR TIMES DAILY AS DIRECTED   vitamin B-12 (CYANOCOBALAMIN) 1000 MCG tablet Take 1,000 mcg by mouth daily with breakfast.   vitamin E 400 UNIT capsule Take 400 Units by mouth at bedtime.   [DISCONTINUED] carvedilol (COREG) 12.5 MG tablet TAKE 1 TABLET TWICE DAILY     Allergies:   Codeine sulfate and Morphine sulfate   Social History   Socioeconomic History   Marital status: Single    Spouse name: Not on file   Number of children: 2   Years of education: Not on file   Highest education level: Not on file  Occupational History   Occupation: Retired    Associate Professor: RETIRED  Tobacco Use   Smoking status: Former    Packs/day: 1.00    Years:  55.00    Additional pack years: 0.00    Total pack years: 55.00    Types: Cigarettes    Quit date: 09/29/2006    Years since quitting: 16.2   Smokeless tobacco: Never  Vaping Use   Vaping Use: Never used  Substance and Sexual Activity   Alcohol use: No   Drug use: No   Sexual activity: Not Currently  Other Topics Concern   Not on file  Social History Narrative   Lives with her son - 2 very supportive sons   Right handed   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   Drinks caffeine   Social Determinants of Corporate investment banker Strain: Low Risk  (06/20/2022)   Overall Financial Resource Strain (CARDIA)    Difficulty of Paying Living Expenses: Not hard at all  Food Insecurity: No Food Insecurity (06/20/2022)   Hunger Vital Sign    Worried About Running Out of Food in the Last Year: Never true    Ran Out of Food in the Last Year: Never true  Transportation Needs: No Transportation Needs (06/20/2022)   PRAPARE - Administrator, Civil Service (Medical): No    Lack of Transportation (Non-Medical): No  Physical Activity: Inactive (06/20/2022)   Exercise Vital Sign    Days of Exercise per Week: 0 days    Minutes of Exercise per Session: 0 min  Stress: No Stress Concern Present (06/20/2022)   [employer] of Occupational Health - Occupational Stress Questionnaire    Feeling of Stress : Not at all  Social Connections: Socially Isolated (06/20/2022)   Social Connection and Isolation Panel [NHANES]    Frequency of Communication with Friends and Family: Once a week    Frequency of Social Gatherings with Friends and Family: Once a week    Attends Religious Services: Never    Database administrator or Organizations: No    Attends Engineer, structural: Never    Marital Status: Never married     Family History: The patient's family history includes Bone cancer in her brother; Cancer in her maternal grandfather, paternal grandfather, and paternal grandmother;  Cancer (age of onset: 58) in her father; Diabetes  in her son and son; Heart attack in her son; Heart attack (age of onset: 63) in her mother; Hyperlipidemia in her son; Hypertension in her son; Liver cancer in her brother. There is no history of Colon cancer, Ovarian cancer, or Uterine cancer.  ROS:   Please see the history of present illness.    All other systems reviewed and are negative.  EKGs/Labs/Other Studies Reviewed:        Renal doppler 07/29/20 Right: Normal size right kidney. RRV flow present. No evidence of right renal artery stenosis.  Left:  Normal size of left kidney. LRV flow present. No evidence of left renal artery stenosis.  EKG:  EKG is  ordered today.  The ekg ordered today demonstrates NSR 74 bpm with poor R wave progression no acute ST/T wave changes.   Recent Labs: 08/19/2022: ALT 12; BUN 35; Creatinine, Ser 0.99; Hemoglobin 12.5; Platelets 351.0; Potassium 4.4; Sodium 135   Recent Lipid Panel    Component Value Date/Time   CHOL 122 08/19/2022 0912   TRIG 183.0 (H) 08/19/2022 0912   TRIG 1693 (HH) 07/28/2006 1202   HDL 53.40 08/19/2022 0912   CHOLHDL 2 08/19/2022 0912   VLDL 36.6 08/19/2022 0912   LDLCALC 32 08/19/2022 0912   LDLCALC 42 02/18/2020 1529   LDLDIRECT 44.0 07/08/2021 1612    Physical Exam:   VS:  BP (!) 110/52 (BP Location: Left Arm, Patient Position: Sitting, Cuff Size: Normal)   Pulse 74   Ht 5\' 5"  (1.651 m)   Wt 167 lb 11.2 oz (76.1 kg)   BMI 27.91 kg/m  , BMI Body mass index is 27.91 kg/m. GENERAL:  Well appearing, overweight HEENT: Pupils equal round and reactive, fundi not visualized, oral mucosa unremarkable NECK:  No jugular venous distention, waveform within normal limits, carotid upstroke brisk and symmetric, no bruits, no thyromegaly LYMPHATICS:  No cervical adenopathy LUNGS:  Clear to auscultation bilaterally HEART:  RRR.  PMI not displaced or sustained,S1 and S2 within normal limits, no S3, no S4, no clicks, no rubs, no  murmurs ABD:  Flat, positive bowel sounds normal in frequency in pitch, no bruits, no rebound, no guarding, no midline pulsatile mass, no hepatomegaly, no splenomegaly EXT:  2 plus pulses throughout, no edema, no cyanosis no clubbing SKIN:  No rashes no nodules NEURO:  Cranial nerves II through XII grossly intact, motor grossly intact throughout PSYCH:  Cognitively intact, oriented to person place and time   ASSESSMENT/PLAN:    HTN - Remains labile but overall controlled at home. Continue Amlodipine 2.5mg  QAM, Carvedilol 12.5mg  BID, Enalapril 20mg  BID, Lasix 20mg  QD, Hydralazine 50mg  TID, Spironolactone 25mg  QD. However, if midday SBP <110 she will hold midday dose of Hydralazine.  Urine catecholamines, metanephrines unremarkable. Prior renal artery dopplers and renin-aldosterone ratios normal.   HLD - Continue Rosuvastatin. Refill Vascepa.   DM2 - Continue to follow with PCP.   OSA - Prior sleep study with OSA. However, would need updated sleep study to initiate CPAP and has politely declined.  Screening for Secondary Hypertension:     Relevant Labs/Studies:    Latest Ref Rng & Units 08/19/2022    9:12 AM 03/22/2022    3:15 PM 03/02/2022   10:52 AM  Basic Labs  Sodium 135 - 145 mEq/L 135  132  132   Potassium 3.5 - 5.1 mEq/L 4.4  4.6  4.6   Creatinine 0.40 - 1.20 mg/dL 1.61  0.96  0.45        Latest  Ref Rng & Units 08/12/2020    6:33 PM 06/24/2020   10:15 PM  Thyroid   TSH 0.350 - 4.500 uIU/mL 0.820  1.680        Latest Ref Rng & Units 08/18/2020    7:06 AM  Renin/Aldosterone   Aldosterone 0.0 - 30.0 ng/dL 4.1           Latest Ref Rng & Units 09/04/2020    9:55 AM  Cortisol  Cortisol  ug/dL 16.1        0/96/0454   10:16 AM  Renovascular   Renal Artery [place] Completed Yes      Disposition:    FU with MD/PharmD in 4-6 months    Medication Adjustments/Labs and Tests Ordered: Current medicines are reviewed at length with the patient today.  Concerns regarding  medicines are outlined above.  Orders Placed This Encounter  Procedures   EKG 12-Lead   Meds ordered this encounter  Medications   icosapent Ethyl (VASCEPA) 1 g capsule    Sig: Take 2 capsules (2 g total) by mouth 2 (two) times daily.    Dispense:  360 capsule    Refill:  3   carvedilol (COREG) 12.5 MG tablet    Sig: Take 1 tablet (12.5 mg total) by mouth 2 (two) times daily.    Dispense:  180 tablet    Refill:  3    Order Specific Question:   Supervising Provider    Answer:   Jodelle Red [[id number]]     Signed, Alver Sorrow, NP  12/07/2022 1:46 PM    [facility]

## 2022-12-08 ENCOUNTER — Other Ambulatory Visit: Payer: Self-pay | Admitting: Family Medicine

## 2022-12-12 DIAGNOSIS — N183 Chronic kidney disease, stage 3 unspecified: Secondary | ICD-10-CM | POA: Diagnosis not present

## 2022-12-12 DIAGNOSIS — M170 Bilateral primary osteoarthritis of knee: Secondary | ICD-10-CM | POA: Diagnosis not present

## 2022-12-12 DIAGNOSIS — G40909 Epilepsy, unspecified, not intractable, without status epilepticus: Secondary | ICD-10-CM | POA: Diagnosis not present

## 2022-12-12 DIAGNOSIS — G8929 Other chronic pain: Secondary | ICD-10-CM | POA: Diagnosis not present

## 2022-12-12 DIAGNOSIS — F0284 Dementia in other diseases classified elsewhere, unspecified severity, with anxiety: Secondary | ICD-10-CM | POA: Diagnosis not present

## 2022-12-12 DIAGNOSIS — I130 Hypertensive heart and chronic kidney disease with heart failure and stage 1 through stage 4 chronic kidney disease, or unspecified chronic kidney disease: Secondary | ICD-10-CM | POA: Diagnosis not present

## 2022-12-12 DIAGNOSIS — M545 Low back pain, unspecified: Secondary | ICD-10-CM | POA: Diagnosis not present

## 2022-12-12 DIAGNOSIS — I5032 Chronic diastolic (congestive) heart failure: Secondary | ICD-10-CM | POA: Diagnosis not present

## 2022-12-12 DIAGNOSIS — E1122 Type 2 diabetes mellitus with diabetic chronic kidney disease: Secondary | ICD-10-CM | POA: Diagnosis not present

## 2022-12-16 DIAGNOSIS — N183 Chronic kidney disease, stage 3 unspecified: Secondary | ICD-10-CM | POA: Diagnosis not present

## 2022-12-16 DIAGNOSIS — M170 Bilateral primary osteoarthritis of knee: Secondary | ICD-10-CM | POA: Diagnosis not present

## 2022-12-16 DIAGNOSIS — I130 Hypertensive heart and chronic kidney disease with heart failure and stage 1 through stage 4 chronic kidney disease, or unspecified chronic kidney disease: Secondary | ICD-10-CM | POA: Diagnosis not present

## 2022-12-16 DIAGNOSIS — E1122 Type 2 diabetes mellitus with diabetic chronic kidney disease: Secondary | ICD-10-CM | POA: Diagnosis not present

## 2022-12-16 DIAGNOSIS — G8929 Other chronic pain: Secondary | ICD-10-CM | POA: Diagnosis not present

## 2022-12-16 DIAGNOSIS — G40909 Epilepsy, unspecified, not intractable, without status epilepticus: Secondary | ICD-10-CM | POA: Diagnosis not present

## 2022-12-16 DIAGNOSIS — I5032 Chronic diastolic (congestive) heart failure: Secondary | ICD-10-CM | POA: Diagnosis not present

## 2022-12-16 DIAGNOSIS — F0284 Dementia in other diseases classified elsewhere, unspecified severity, with anxiety: Secondary | ICD-10-CM | POA: Diagnosis not present

## 2022-12-16 DIAGNOSIS — M545 Low back pain, unspecified: Secondary | ICD-10-CM | POA: Diagnosis not present

## 2022-12-19 ENCOUNTER — Encounter: Payer: Self-pay | Admitting: Family Medicine

## 2022-12-19 ENCOUNTER — Ambulatory Visit (INDEPENDENT_AMBULATORY_CARE_PROVIDER_SITE_OTHER): Payer: Medicare HMO | Admitting: Family Medicine

## 2022-12-19 VITALS — BP 112/60 | HR 79 | Temp 98.2°F | Ht 65.0 in | Wt 177.0 lb

## 2022-12-19 DIAGNOSIS — Z7984 Long term (current) use of oral hypoglycemic drugs: Secondary | ICD-10-CM

## 2022-12-19 DIAGNOSIS — I5032 Chronic diastolic (congestive) heart failure: Secondary | ICD-10-CM

## 2022-12-19 DIAGNOSIS — Z794 Long term (current) use of insulin: Secondary | ICD-10-CM | POA: Diagnosis not present

## 2022-12-19 DIAGNOSIS — I10 Essential (primary) hypertension: Secondary | ICD-10-CM | POA: Diagnosis not present

## 2022-12-19 DIAGNOSIS — E119 Type 2 diabetes mellitus without complications: Secondary | ICD-10-CM

## 2022-12-19 DIAGNOSIS — F0150 Vascular dementia without behavioral disturbance: Secondary | ICD-10-CM

## 2022-12-19 LAB — COMPREHENSIVE METABOLIC PANEL
ALT: 11 U/L (ref 0–35)
AST: 14 U/L (ref 0–37)
Albumin: 4.1 g/dL (ref 3.5–5.2)
Alkaline Phosphatase: 33 U/L — ABNORMAL LOW (ref 39–117)
BUN: 33 mg/dL — ABNORMAL HIGH (ref 6–23)
CO2: 23 mEq/L (ref 19–32)
Calcium: 9.6 mg/dL (ref 8.4–10.5)
Chloride: 103 mEq/L (ref 96–112)
Creatinine, Ser: 1.15 mg/dL (ref 0.40–1.20)
GFR: 42.7 mL/min — ABNORMAL LOW (ref >60.00)
Glucose, Bld: 88 mg/dL (ref 70–99)
Potassium: 4.5 mEq/L (ref 3.5–5.1)
Sodium: 135 mEq/L (ref 135–145)
Total Bilirubin: 0.3 mg/dL (ref 0.2–1.2)
Total Protein: 6.8 g/dL (ref 6.0–8.3)

## 2022-12-19 LAB — HEMOGLOBIN A1C: Hgb A1c MFr Bld: 5.5 % (ref 4.6–6.5)

## 2022-12-19 NOTE — Patient Instructions (Addendum)
Consider at pharmacy- shingrix, Tetanus, Diphtheria, and Pertussis (Tdap), consider COVID shot  Please stop by lab before you go If you have mychart- we will send your results within 3 business days of [place] receiving them.  If you do not have mychart- we will call you about results within 5 business days of [place] receiving them.  *please also note that you will see labs on mychart as soon as they post. I will later go in and write notes on them- will say "notes from Dr. Durene Cal"   Recommended follow up: Return in about 5 months (around 05/21/2023) for followup or sooner if needed.Schedule b4 you leave.

## 2022-12-19 NOTE — Progress Notes (Signed)
Phone [phone] In person visit   Subjective:   Michelle [name] is a 89 y.o. year old very pleasant female patient who presents for/with See problem oriented charting Chief Complaint  Patient presents with   Medical Management of Chronic Issues   Hypertension   Diabetes   Fall    Pt fell last week trying to get onto bed side commode, EMS came out and evaluated.    Past Medical History-  Patient Active Problem List   Diagnosis Date Noted   Epilepsy (HCC) 03/12/2021    Priority: High   Dementia (HCC) 10/22/2020    Priority: High   Delirium 06/25/2020    Priority: High   Chronic diastolic CHF (congestive heart failure) (HCC) 03/13/2014    Priority: High   CAD- RCA PCI '90s, STEMI-RCA DES 02/18/14 03/05/2014    Priority: High   Type 2 diabetes mellitus without complication, with long-term current use of insulin (HCC) 03/26/2007    Priority: High   Anemia, iron deficiency 10/23/2015    Priority: Medium    CKD (chronic kidney disease), stage III (HCC) 07/25/2014    Priority: Medium    Osteoarthritis, knee 05/13/2014    Priority: Medium    Anxiety state 04/21/2014    Priority: Medium    SOB (shortness of breath) 03/05/2014    Priority: Medium    Hyperlipidemia associated with type 2 diabetes mellitus (HCC) 03/26/2007    Priority: Medium    Essential hypertension 04/21/2006    Priority: Medium    COPD (chronic obstructive pulmonary disease) (HCC) 04/21/2006    Priority: Medium    Actinic keratosis 11/17/2017    Priority: Low   Back pain, lumbosacral 10/10/2012    Priority: Low   Memory impairment 09/09/2022   Chronic pain of both knees 04/05/2022   Adnexal mass 11/24/2020   Thickened endometrium 11/24/2020    Medications- reviewed and updated Current Outpatient Medications  Medication Sig Dispense Refill   acetaminophen (TYLENOL) 650 MG CR tablet Take 1,300 mg by mouth 3 (three) times daily. Patient take 3 times a day.     albuterol (PROVENTIL) (2.5 MG/3ML)  0.083% nebulizer solution USE THREE MILLILITERS VIA NEBULIZATION BY MOUTH EVERY 6 HOURS AS NEEDED FOR WHEEZING OR SHORTNESS OF BREATH 75 mL 3   albuterol (VENTOLIN HFA) 108 (90 Base) MCG/ACT inhaler INHALE 1 PUFF EVERY 4 HOURS AS NEEDED FOR WHEEZING, SHORTNESS OF BREATH (RESCUE INHALER IF ADVAIR NOT WORKING) 2 each 2   Alcohol Swabs (DROPSAFE ALCOHOL PREP) 70 % PADS USE FOR TESTING THREE TIMES DAILY 300 each 3   amLODipine (NORVASC) 2.5 MG tablet Take 2.5 mg in the morning. If afternoon blood pressure >145/90 take second tablet of 2.5 180 tablet 3   aspirin 81 MG tablet Take 81 mg by mouth daily with breakfast.     Blood Glucose Calibration (ACCU-CHEK SMARTVIEW CONTROL) LIQD      blood glucose meter kit and supplies KIT Dispense based on patient and insurance preference. Use up to four times daily as directed. Dx:E11.9 1 each 3   calcium carbonate (OSCAL) 1500 (600 Ca) MG TABS tablet Take 600 mg of elemental calcium by mouth daily with breakfast.     carvedilol (COREG) 12.5 MG tablet Take 1 tablet (12.5 mg total) by mouth 2 (two) times daily. 180 tablet 3   cholecalciferol (VITAMIN D3) 25 MCG (1000 UNIT) tablet Take 1,000 Units by mouth daily with breakfast.     clopidogrel (PLAVIX) 75 MG tablet TAKE 1 TABLET EVERY DAY 90  tablet 3   diclofenac Sodium (VOLTAREN) 1 % GEL APPLY 2 GRAMS TOPICALLY TO AFFECTED AREA FOUR TIMES A DAY 300 g 3   DROPLET PEN NEEDLES 31G X 8 MM MISC USE AS DIRECTED WITH LANTUS SOLOSTAR PEN 100 each 4   enalapril (VASOTEC) 20 MG tablet TAKE 1 TABLET TWICE DAILY 180 tablet 3   fenofibrate micronized (LOFIBRA) 134 MG capsule TAKE 1 CAPSULE EVERY DAY 90 capsule 3   ferrous sulfate 325 (65 FE) MG tablet Twice a week (Patient taking differently: Take 325 mg by mouth 2 (two) times a week.) 18 tablet 3   fluticasone-salmeterol (ADVAIR) 250-50 MCG/ACT AEPB INHALE 1 PUFF TWICE DAILY 180 each 3   folic acid (FOLVITE) 400 MCG tablet Take 400 mcg by mouth daily with breakfast.      furosemide (LASIX) 20 MG tablet TAKE 1 TABLET EVERY DAY 90 tablet 3   hydrALAZINE (APRESOLINE) 50 MG tablet Take 1 tablet (50 mg total) by mouth 3 (three) times daily. 270 tablet 3   icosapent Ethyl (VASCEPA) 1 g capsule Take 2 capsules (2 g total) by mouth 2 (two) times daily. 360 capsule 3   insulin glargine (LANTUS SOLOSTAR) 100 UNIT/ML Solostar Pen INJECT 17 TO 20 UNITS UNDER THE SKIN EVERY MORNING 30 mL 3   lamoTRIgine (LAMICTAL) 25 MG tablet TAKE 1 TABLET TWICE DAILY 60 tablet 4   Lancets Misc. (ACCU-CHEK FASTCLIX LANCET) KIT 1 Device by Does not apply route. Use as directed for testing blood sugar     Lancets Misc. (ACCU-CHEK FASTCLIX LANCET) KIT Use as directed to test blood sugar 1 kit 1   loratadine (CLARITIN) 10 MG tablet Take 10 mg by mouth every other day.     medroxyPROGESTERone (PROVERA) 10 MG tablet Take 1 tablet (10 mg total) by mouth daily. 30 tablet 3   Melatonin 5 MG CAPS Take 5 mg by mouth at bedtime.     metFORMIN (GLUCOPHAGE-XR) 500 MG 24 hr tablet TAKE 1 TABLET (500 MG TOTAL) BY MOUTH IN THE MORNING AND AT BEDTIME. 180 tablet 3   nitroGLYCERIN (NITROSTAT) 0.4 MG SL tablet DISSOLVE 1 TAB UNDER TONGUE FOR CHEST PAIN - IF PAIN REMAINS AFTER 5 MIN, CALL 911 AND REPEAT DOSE. MAX 3 TABS IN 15 MINUTES 25 tablet 6   nortriptyline (PAMELOR) 10 MG capsule TAKE 1 CAPSULE BY MOUTH AT BEDTIME AS NEEDED FOR PAIN PREVENTION 30 capsule 1   pantoprazole (PROTONIX) 40 MG tablet TAKE 1 TABLET EVERY DAY 90 tablet 3   rosuvastatin (CRESTOR) 40 MG tablet TAKE 1 TABLET EVERY DAY 90 tablet 3   sertraline (ZOLOFT) 50 MG tablet TAKE 2 TABLETS AT BEDTIME 180 tablet 3   SPIRIVA HANDIHALER 18 MCG inhalation capsule PLACE 1 CAPSULE INTO HANDIHALER AND INHALE THE CONTENTS DAILY 90 capsule 3   spironolactone (ALDACTONE) 25 MG tablet TAKE 1 TABLET EVERY DAY 90 tablet 3   TRUE METRIX BLOOD GLUCOSE TEST test strip TEST BLOOD SUGAR UP TO FOUR TIMES DAILY AS DIRECTED 400 strip 3   TRUEplus Lancets 33G MISC  TEST BLOOD SUGAR UP TO FOUR TIMES DAILY AS DIRECTED 400 each 3   vitamin B-12 (CYANOCOBALAMIN) 1000 MCG tablet Take 1,000 mcg by mouth daily with breakfast.     vitamin E 400 UNIT capsule Take 400 Units by mouth at bedtime.     No current facility-administered medications for this visit.     Objective:  BP 112/60   Pulse 79   Temp 98.2 F (36.8 C)  Ht 5\' 5"  (1.651 m)   Wt 177 lb (80.3 kg)   SpO2 96%   BMI 29.45 kg/m  Gen: NAD, resting comfortably CV: RRR no murmurs rubs or gallops Lungs: CTAB no crackles, wheeze, rhonchi Ext: no edema Skin: warm, dry    Diabetic Foot Exam - Simple   Simple Foot Form Diabetic Foot exam was performed with the following findings: Yes 12/19/2022  2:31 PM  Visual Inspection No deformities, no ulcerations, no other skin breakdown bilaterally: Yes Sensation Testing Intact to touch and monofilament testing bilaterally: Yes Pulse Check Posterior Tibialis and Dorsalis pulse intact bilaterally: Yes Comments Slightly decreased on toes         Assessment and Plan   # Falls -3 weeks ago slid off side of bed- EMS helped -last week  fell trying to get onto bedside commode-EMS had to come out and evaluate her-thankfully no lingering injuries  from either -She was most recently seen by neurology for imbalance on 11/17/2022-they recommended ongoing physical therapy- considered bedrails but concern is she could crawl over or get into worst posiiton. Considering on bed alarm -encouraged her to call to help  #Palliative approach-working with palliative in [place]-not yet on hospice- visits are spacing out some due to staffing issues  # Adnexal mass-Patient declines definitive surgery- staying on provera seems to help bleeding  #Dementia likely vascular- follows with neurology since 2022 - no worsening of memory- reasonably stable- if confused- much shorter lived than in the past- could be within a few minutes   #Seizure history-follows with Dr.  Karel Jarvis on Lamictal 25 mg twice daily   # Severe knee arthritis-Zilretta injections with Dr. Denyse Amass due to failed conventional steroid injections and hyaluronic acid injections in the past - this round didn't help quite as much  #CAD  #hyperlipidemia S: Medication:Aspirin 81 mg, Plavix 75 mg, fenofibrate 134 mg, Vascepa 1 g, rosuvastatin 40 mg Lab Results  Component Value Date   CHOL 122 08/19/2022   HDL 53.40 08/19/2022   LDLCALC 32 08/19/2022   LDLDIRECT 44.0 07/08/2021   TRIG 183.0 (H) 08/19/2022   CHOLHDL 2 08/19/2022  - no chest pain and stable dyspnea on exertion  A/P: coronary artery disease largely asymptomatic continue current medications - lipids at goal    #CHF  #hypertension-works with resistant hypertension clinic and deals with variability S: medication: Furosemide 20 mg, hydralazine 50 mg 3 times a day (per cardiology can hold midday dose if blood pressure less than 110), amlodipine 2.5 mg, carvedilol 12.5 mg twice daily, enalapril 20 mg twice daily, spironolactone 25 mg daily  Home readings #s: still very variable for instance over last few days from 100 to 163 with diastolic generally controlled. Occasionally has to hold hydralazine midday. Average appears to be in 120s or 130s Edema- none recently, weight- stable within pound or so, shortness of breath- some with exertion but stable  -incentive spirometer is helping keep her lungs open A/P: CHF euvolemic - continue current medications Hypertension- reasonable control - continue current medications   # Diabetes S: Medication:Metformin 500 mg extended release twice daily, Lantus 20units each morning CBGs- low 100s to 110s for most part Lab Results  Component Value Date   HGBA1C 6.0 08/19/2022   HGBA1C 6.6 (H) 03/22/2022   HGBA1C 6.8 (H) 10/14/2021  A/P: hopefully stable- update a1c today. Continue current meds for now    # COPD  S: Patient reports stable shortness of breath and cough - not too bad Maintenance  medications: Advair  250-50 mcg 1 puff twice daily, spiriva -very sparing albuterol  once a week A/P: stable- continue current medicines   #GERD-  on pantoprazole 40 mg daily- stable- continue current medicines   #depressed mood- on sertraline 100 mg (two of the 50 mg for swallowing purposes)  Recommended follow up: Return in about 5 months (around 05/21/2023) for followup or sooner if needed.Schedule b4 you leave. Future Appointments  Date Time Provider Department Center  02/24/2023 11:00 AM Van Clines, MD [facility] None  06/23/2023  1:20 PM Chilton Si, MD [facility]  06/26/2023  2:30 PM [facility] ANNUAL WELLNESS VISIT 1 [facility]  10/04/2023  1:00 PM Rennis Chris, MD [facility] None    Lab/Order associations:   ICD-10-CM   1. Type 2 diabetes mellitus without complication, with long-term current use of insulin (HCC)  E11.9 Comprehensive metabolic panel   Z61.0 Hemoglobin A1c    2. Essential hypertension  I10       No orders of the defined types were placed in this encounter.   Return precautions advised.  Tana Conch, MD

## 2022-12-20 ENCOUNTER — Other Ambulatory Visit: Payer: Self-pay

## 2022-12-20 MED ORDER — METFORMIN HCL ER 500 MG PO TB24: 500.0000 mg | ORAL_TABLET | Freq: Every day | ORAL | 3 refills | Status: DC

## 2022-12-21 ENCOUNTER — Other Ambulatory Visit: Payer: Self-pay | Admitting: Neurology

## 2022-12-22 DIAGNOSIS — N183 Chronic kidney disease, stage 3 unspecified: Secondary | ICD-10-CM | POA: Diagnosis not present

## 2022-12-22 DIAGNOSIS — I5032 Chronic diastolic (congestive) heart failure: Secondary | ICD-10-CM | POA: Diagnosis not present

## 2022-12-22 DIAGNOSIS — E1122 Type 2 diabetes mellitus with diabetic chronic kidney disease: Secondary | ICD-10-CM | POA: Diagnosis not present

## 2022-12-22 DIAGNOSIS — M170 Bilateral primary osteoarthritis of knee: Secondary | ICD-10-CM | POA: Diagnosis not present

## 2022-12-22 DIAGNOSIS — M545 Low back pain, unspecified: Secondary | ICD-10-CM | POA: Diagnosis not present

## 2022-12-22 DIAGNOSIS — I130 Hypertensive heart and chronic kidney disease with heart failure and stage 1 through stage 4 chronic kidney disease, or unspecified chronic kidney disease: Secondary | ICD-10-CM | POA: Diagnosis not present

## 2022-12-22 DIAGNOSIS — F0284 Dementia in other diseases classified elsewhere, unspecified severity, with anxiety: Secondary | ICD-10-CM | POA: Diagnosis not present

## 2022-12-22 DIAGNOSIS — G40909 Epilepsy, unspecified, not intractable, without status epilepticus: Secondary | ICD-10-CM | POA: Diagnosis not present

## 2022-12-22 DIAGNOSIS — G8929 Other chronic pain: Secondary | ICD-10-CM | POA: Diagnosis not present

## 2022-12-26 DIAGNOSIS — M170 Bilateral primary osteoarthritis of knee: Secondary | ICD-10-CM | POA: Diagnosis not present

## 2022-12-26 DIAGNOSIS — G40909 Epilepsy, unspecified, not intractable, without status epilepticus: Secondary | ICD-10-CM | POA: Diagnosis not present

## 2022-12-26 DIAGNOSIS — G8929 Other chronic pain: Secondary | ICD-10-CM | POA: Diagnosis not present

## 2022-12-26 DIAGNOSIS — E1122 Type 2 diabetes mellitus with diabetic chronic kidney disease: Secondary | ICD-10-CM | POA: Diagnosis not present

## 2022-12-26 DIAGNOSIS — M545 Low back pain, unspecified: Secondary | ICD-10-CM | POA: Diagnosis not present

## 2022-12-26 DIAGNOSIS — N183 Chronic kidney disease, stage 3 unspecified: Secondary | ICD-10-CM | POA: Diagnosis not present

## 2022-12-26 DIAGNOSIS — I130 Hypertensive heart and chronic kidney disease with heart failure and stage 1 through stage 4 chronic kidney disease, or unspecified chronic kidney disease: Secondary | ICD-10-CM | POA: Diagnosis not present

## 2022-12-26 DIAGNOSIS — F0284 Dementia in other diseases classified elsewhere, unspecified severity, with anxiety: Secondary | ICD-10-CM | POA: Diagnosis not present

## 2022-12-26 DIAGNOSIS — I5032 Chronic diastolic (congestive) heart failure: Secondary | ICD-10-CM | POA: Diagnosis not present

## 2022-12-27 ENCOUNTER — Other Ambulatory Visit: Payer: Self-pay | Admitting: Family Medicine

## 2022-12-30 DIAGNOSIS — Z515 Encounter for palliative care: Secondary | ICD-10-CM | POA: Diagnosis not present

## 2022-12-30 DIAGNOSIS — G40909 Epilepsy, unspecified, not intractable, without status epilepticus: Secondary | ICD-10-CM | POA: Diagnosis not present

## 2022-12-30 DIAGNOSIS — G8929 Other chronic pain: Secondary | ICD-10-CM | POA: Diagnosis not present

## 2022-12-30 DIAGNOSIS — M170 Bilateral primary osteoarthritis of knee: Secondary | ICD-10-CM | POA: Diagnosis not present

## 2022-12-30 DIAGNOSIS — I130 Hypertensive heart and chronic kidney disease with heart failure and stage 1 through stage 4 chronic kidney disease, or unspecified chronic kidney disease: Secondary | ICD-10-CM | POA: Diagnosis not present

## 2022-12-30 DIAGNOSIS — N183 Chronic kidney disease, stage 3 unspecified: Secondary | ICD-10-CM | POA: Diagnosis not present

## 2022-12-30 DIAGNOSIS — I5032 Chronic diastolic (congestive) heart failure: Secondary | ICD-10-CM | POA: Diagnosis not present

## 2022-12-30 DIAGNOSIS — M545 Low back pain, unspecified: Secondary | ICD-10-CM | POA: Diagnosis not present

## 2022-12-30 DIAGNOSIS — F0284 Dementia in other diseases classified elsewhere, unspecified severity, with anxiety: Secondary | ICD-10-CM | POA: Diagnosis not present

## 2022-12-30 DIAGNOSIS — E1122 Type 2 diabetes mellitus with diabetic chronic kidney disease: Secondary | ICD-10-CM | POA: Diagnosis not present

## 2023-01-02 DIAGNOSIS — I130 Hypertensive heart and chronic kidney disease with heart failure and stage 1 through stage 4 chronic kidney disease, or unspecified chronic kidney disease: Secondary | ICD-10-CM | POA: Diagnosis not present

## 2023-01-02 DIAGNOSIS — M170 Bilateral primary osteoarthritis of knee: Secondary | ICD-10-CM | POA: Diagnosis not present

## 2023-01-02 DIAGNOSIS — F0284 Dementia in other diseases classified elsewhere, unspecified severity, with anxiety: Secondary | ICD-10-CM | POA: Diagnosis not present

## 2023-01-02 DIAGNOSIS — G8929 Other chronic pain: Secondary | ICD-10-CM | POA: Diagnosis not present

## 2023-01-02 DIAGNOSIS — N183 Chronic kidney disease, stage 3 unspecified: Secondary | ICD-10-CM | POA: Diagnosis not present

## 2023-01-02 DIAGNOSIS — G40909 Epilepsy, unspecified, not intractable, without status epilepticus: Secondary | ICD-10-CM | POA: Diagnosis not present

## 2023-01-02 DIAGNOSIS — E1122 Type 2 diabetes mellitus with diabetic chronic kidney disease: Secondary | ICD-10-CM | POA: Diagnosis not present

## 2023-01-02 DIAGNOSIS — I5032 Chronic diastolic (congestive) heart failure: Secondary | ICD-10-CM | POA: Diagnosis not present

## 2023-01-02 DIAGNOSIS — M545 Low back pain, unspecified: Secondary | ICD-10-CM | POA: Diagnosis not present

## 2023-01-05 ENCOUNTER — Telehealth: Payer: Self-pay | Admitting: *Deleted

## 2023-01-05 NOTE — Telephone Encounter (Signed)
Called [name] on the request of Warner Mccreedy, NP. Spoke with Michelle [name], Michelle [name]'s son who states she is napping. Relayed question from [facility] in regards to [name]'s bleeding.  Michelle [name] states, "She hasn't had any bleeding since starting the medication." Patient continues to take Provera 10 mg and refill was requested. Curlene Labrum that message would be relayed to Warner Mccreedy, NP and the office would call with recommendations.

## 2023-01-09 DIAGNOSIS — M170 Bilateral primary osteoarthritis of knee: Secondary | ICD-10-CM | POA: Diagnosis not present

## 2023-01-09 DIAGNOSIS — I130 Hypertensive heart and chronic kidney disease with heart failure and stage 1 through stage 4 chronic kidney disease, or unspecified chronic kidney disease: Secondary | ICD-10-CM | POA: Diagnosis not present

## 2023-01-09 DIAGNOSIS — G8929 Other chronic pain: Secondary | ICD-10-CM | POA: Diagnosis not present

## 2023-01-09 DIAGNOSIS — N183 Chronic kidney disease, stage 3 unspecified: Secondary | ICD-10-CM | POA: Diagnosis not present

## 2023-01-09 DIAGNOSIS — G40909 Epilepsy, unspecified, not intractable, without status epilepticus: Secondary | ICD-10-CM | POA: Diagnosis not present

## 2023-01-09 DIAGNOSIS — F0284 Dementia in other diseases classified elsewhere, unspecified severity, with anxiety: Secondary | ICD-10-CM | POA: Diagnosis not present

## 2023-01-09 DIAGNOSIS — E1122 Type 2 diabetes mellitus with diabetic chronic kidney disease: Secondary | ICD-10-CM | POA: Diagnosis not present

## 2023-01-09 DIAGNOSIS — I5032 Chronic diastolic (congestive) heart failure: Secondary | ICD-10-CM | POA: Diagnosis not present

## 2023-01-09 DIAGNOSIS — M545 Low back pain, unspecified: Secondary | ICD-10-CM | POA: Diagnosis not present

## 2023-01-16 ENCOUNTER — Other Ambulatory Visit: Payer: Self-pay

## 2023-01-16 ENCOUNTER — Emergency Department (HOSPITAL_COMMUNITY): Payer: Medicare HMO

## 2023-01-16 ENCOUNTER — Emergency Department (HOSPITAL_COMMUNITY)
Admission: EM | Admit: 2023-01-16 | Discharge: 2023-01-16 | Disposition: A | Discharge status: Home / Self Care | Payer: Medicare HMO | Attending: Emergency Medicine | Admitting: Emergency Medicine

## 2023-01-16 ENCOUNTER — Encounter (HOSPITAL_COMMUNITY): Payer: Self-pay

## 2023-01-16 DIAGNOSIS — R4182 Altered mental status, unspecified: Secondary | ICD-10-CM | POA: Insufficient documentation

## 2023-01-16 DIAGNOSIS — Z79899 Other long term (current) drug therapy: Secondary | ICD-10-CM | POA: Insufficient documentation

## 2023-01-16 DIAGNOSIS — I2510 Atherosclerotic heart disease of native coronary artery without angina pectoris: Secondary | ICD-10-CM | POA: Insufficient documentation

## 2023-01-16 DIAGNOSIS — Z7984 Long term (current) use of oral hypoglycemic drugs: Secondary | ICD-10-CM | POA: Diagnosis not present

## 2023-01-16 DIAGNOSIS — J449 Chronic obstructive pulmonary disease, unspecified: Secondary | ICD-10-CM | POA: Diagnosis not present

## 2023-01-16 DIAGNOSIS — Z7951 Long term (current) use of inhaled steroids: Secondary | ICD-10-CM | POA: Insufficient documentation

## 2023-01-16 DIAGNOSIS — Z7902 Long term (current) use of antithrombotics/antiplatelets: Secondary | ICD-10-CM | POA: Diagnosis not present

## 2023-01-16 DIAGNOSIS — F0390 Unspecified dementia without behavioral disturbance: Secondary | ICD-10-CM | POA: Diagnosis not present

## 2023-01-16 DIAGNOSIS — R197 Diarrhea, unspecified: Secondary | ICD-10-CM | POA: Diagnosis not present

## 2023-01-16 DIAGNOSIS — I959 Hypotension, unspecified: Secondary | ICD-10-CM | POA: Diagnosis not present

## 2023-01-16 DIAGNOSIS — I10 Essential (primary) hypertension: Secondary | ICD-10-CM | POA: Insufficient documentation

## 2023-01-16 DIAGNOSIS — Z7982 Long term (current) use of aspirin: Secondary | ICD-10-CM | POA: Insufficient documentation

## 2023-01-16 DIAGNOSIS — I700 Atherosclerosis of aorta: Secondary | ICD-10-CM | POA: Diagnosis not present

## 2023-01-16 DIAGNOSIS — R531 Weakness: Secondary | ICD-10-CM | POA: Diagnosis not present

## 2023-01-16 DIAGNOSIS — Z794 Long term (current) use of insulin: Secondary | ICD-10-CM | POA: Insufficient documentation

## 2023-01-16 DIAGNOSIS — F03918 Unspecified dementia, unspecified severity, with other behavioral disturbance: Secondary | ICD-10-CM | POA: Insufficient documentation

## 2023-01-16 DIAGNOSIS — R109 Unspecified abdominal pain: Secondary | ICD-10-CM | POA: Diagnosis not present

## 2023-01-16 DIAGNOSIS — E119 Type 2 diabetes mellitus without complications: Secondary | ICD-10-CM | POA: Insufficient documentation

## 2023-01-16 LAB — CBC WITH DIFFERENTIAL/PLATELET
Abs Immature Granulocytes: 0.06 10*3/uL (ref 0.00–0.07)
Basophils Absolute: 0.1 10*3/uL (ref 0.0–0.1)
Basophils Relative: 1 %
Eosinophils Absolute: 0.1 10*3/uL (ref 0.0–0.5)
Eosinophils Relative: 1 %
HCT: 36.3 % (ref 36.0–46.0)
Hemoglobin: 11.6 g/dL — ABNORMAL LOW (ref 12.0–15.0)
Immature Granulocytes: 1 %
Lymphocytes Relative: 20 %
Lymphs Abs: 1.4 10*3/uL (ref 0.7–4.0)
MCH: 28.6 pg (ref 26.0–34.0)
MCHC: 32 g/dL (ref 30.0–36.0)
MCV: 89.4 fL (ref 80.0–100.0)
Monocytes Absolute: 0.6 10*3/uL (ref 0.1–1.0)
Monocytes Relative: 8 %
Neutro Abs: 5 10*3/uL (ref 1.7–7.7)
Neutrophils Relative %: 69 %
Platelets: 251 10*3/uL (ref 150–400)
RBC: 4.06 MIL/uL (ref 3.87–5.11)
RDW: 13.4 % (ref 11.5–15.5)
WBC: 7.1 10*3/uL (ref 4.0–10.5)
nRBC: 0 % (ref 0.0–0.2)

## 2023-01-16 LAB — COMPREHENSIVE METABOLIC PANEL
ALT: 17 U/L (ref 0–44)
AST: 18 U/L (ref 15–41)
Albumin: 3.5 g/dL (ref 3.5–5.0)
Alkaline Phosphatase: 34 U/L — ABNORMAL LOW (ref 38–126)
Anion gap: 8 (ref 5–15)
BUN: 42 mg/dL — ABNORMAL HIGH (ref 8–23)
CO2: 20 mmol/L — ABNORMAL LOW (ref 22–32)
Calcium: 9.4 mg/dL (ref 8.9–10.3)
Chloride: 106 mmol/L (ref 98–111)
Creatinine, Ser: 1.03 mg/dL — ABNORMAL HIGH (ref 0.44–1.00)
GFR, Estimated: 52 mL/min — ABNORMAL LOW (ref >60)
Glucose, Bld: 132 mg/dL — ABNORMAL HIGH (ref 70–99)
Potassium: 4.2 mmol/L (ref 3.5–5.1)
Sodium: 134 mmol/L — ABNORMAL LOW (ref 135–145)
Total Bilirubin: 0.5 mg/dL (ref 0.3–1.2)
Total Protein: 6.8 g/dL (ref 6.5–8.1)

## 2023-01-16 LAB — URINALYSIS, W/ REFLEX TO CULTURE (INFECTION SUSPECTED)
Bilirubin Urine: NEGATIVE
Glucose, UA: NEGATIVE mg/dL
Ketones, ur: NEGATIVE mg/dL
Leukocytes,Ua: NEGATIVE
Nitrite: NEGATIVE
Protein, ur: NEGATIVE mg/dL
Specific Gravity, Urine: 1.013 (ref 1.005–1.030)
pH: 5 (ref 5.0–8.0)

## 2023-01-16 NOTE — ED Provider Notes (Signed)
[place] EMERGENCY DEPARTMENT AT [facility] Provider Note   CSN: [id number] Arrival date & time: 01/16/23  1017     History  No chief complaint on file.   Michelle [name] is a 89 y.o. female.  HPI Patient brought in reportedly for mental status change.  More confused this morning than normal.  Had some unresponsiveness while getting blood pressure checked earlier today.  Now back at baseline.  Did not have seizure activity but has had previous seizures.  Patient does states she feels bad and is at her baseline dementia.   Past Medical History:  Diagnosis Date   Anginal pain (HCC)    Arthritis    AV block, 1st degree    CAD (coronary artery disease) 1990; 2015   Cardiac cath 1990 with Dr. Riley Kill and [name] Residual CAD, non obstructive per 2015 cath. STEMI status post stent in August 2015.   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 Authorizing Provider  acetaminophen (TYLENOL) 650 MG CR tablet Take 1,300 mg by mouth 3 (three) times daily. Patient take 3 times a day.    [provider]  albuterol (PROVENTIL) (2.5 MG/3ML) 0.083% nebulizer solution USE THREE MILLILITERS VIA NEBULIZATION BY MOUTH EVERY 6 HOURS AS NEEDED FOR WHEEZING OR SHORTNESS OF BREATH 10/12/21   Shelva Majestic, MD  albuterol (VENTOLIN HFA) 108 (90 Base) MCG/ACT inhaler INHALE 1  PUFF EVERY 4 HOURS AS NEEDED FOR WHEEZING, SHORTNESS OF BREATH (RESCUE INHALER IF ADVAIR NOT WORKING) 10/13/22   Shelva Majestic, MD  Alcohol Swabs (DROPSAFE ALCOHOL PREP) 70 % PADS USE FOR TESTING THREE TIMES DAILY 11/07/22   Shelva Majestic, MD  amLODipine (NORVASC) 2.5 MG tablet Take 2.5 mg in the morning. If afternoon blood pressure >145/90 take second tablet of 2.5 06/29/22   Shelva Majestic, MD  aspirin 81 MG tablet Take 81 mg by mouth daily with breakfast. 07/02/14   [provider]  Blood Glucose Calibration (ACCU-CHEK SMARTVIEW CONTROL) LIQD  01/11/17   [provider]  blood glucose meter kit and supplies KIT Dispense based on patient and insurance preference. Use up to four times daily as directed. Dx:E11.9 04/01/21   Shelva Majestic, MD  calcium carbonate (OSCAL) 1500 (600 Ca) MG TABS tablet Take 600 mg of elemental calcium by mouth daily with breakfast.    [provider]  carvedilol (COREG) 12.5 MG tablet Take 1 tablet (12.5 mg total) by mouth 2 (two) times daily. 12/06/22   Alver Sorrow, NP  cholecalciferol (VITAMIN D3) 25 MCG (1000 UNIT) tablet Take 1,000 Units by mouth daily with breakfast.    [provider]  clopidogrel (PLAVIX) 75 MG tablet TAKE 1 TABLET EVERY DAY 11/23/22   Shelva Majestic, MD  diclofenac Sodium (VOLTAREN) 1 % GEL APPLY 2 GRAMS TOPICALLY TO AFFECTED  AREA FOUR TIMES A DAY 08/08/22   Shelva Majestic, MD  DROPLET PEN NEEDLES 31G X 8 MM MISC USE AS DIRECTED WITH LANTUS SOLOSTAR PEN 01/03/22   Shelva Majestic, MD  enalapril (VASOTEC) 20 MG tablet TAKE 1 TABLET TWICE DAILY 11/07/22   Shelva Majestic, MD  fenofibrate micronized (LOFIBRA) 134 MG capsule TAKE 1 CAPSULE EVERY DAY 12/27/22   Shelva Majestic, MD  ferrous sulfate 325 (65 FE) MG tablet Twice a week Patient taking differently: Take 325 mg by mouth 2 (two) times a week. 02/21/20   Shelva Majestic, MD  fluticasone-salmeterol (ADVAIR) 250-50 MCG/ACT AEPB INHALE 1  PUFF TWICE DAILY 09/12/22   Shelva Majestic, MD  folic acid (FOLVITE) 400 MCG tablet Take 400 mcg by mouth daily with breakfast.    [provider]  furosemide (LASIX) 20 MG tablet TAKE 1 TABLET EVERY DAY 05/02/22   Alver Sorrow, NP  hydrALAZINE (APRESOLINE) 50 MG tablet Take 1 tablet (50 mg total) by mouth 3 (three) times daily. 09/06/22   Chilton Si, MD  icosapent Ethyl (VASCEPA) 1 g capsule Take 2 capsules (2 g total) by mouth 2 (two) times daily. 12/06/22   Alver Sorrow, NP  insulin glargine (LANTUS SOLOSTAR) 100 UNIT/ML Solostar Pen INJECT 17 TO 20 UNITS UNDER THE SKIN EVERY MORNING 12/08/22   Shelva Majestic, MD  lamoTRIgine (LAMICTAL) 25 MG tablet TAKE 1 TABLET TWICE DAILY 12/21/22   Van Clines, MD  Lancets Misc. (ACCU-CHEK FASTCLIX LANCET) KIT 1 Device by Does not apply route. Use as directed for testing blood sugar    [provider]  Lancets Misc. (ACCU-CHEK FASTCLIX LANCET) KIT Use as directed to test blood sugar 11/13/20   Shelva Majestic, MD  loratadine (CLARITIN) 10 MG tablet Take 10 mg by mouth every other day.    [provider]  medroxyPROGESTERone (PROVERA) 10 MG tablet Take 1 tablet (10 mg total) by mouth daily. 09/12/22   Warner Mccreedy D, NP  Melatonin 5 MG CAPS Take 5 mg by mouth at bedtime.    [provider]  metFORMIN (GLUCOPHAGE-XR) 500 MG 24 hr tablet Take 1 tablet (500 mg total) by mouth daily with breakfast. 12/20/22   Shelva Majestic, MD  nitroGLYCERIN (NITROSTAT) 0.4 MG SL tablet DISSOLVE 1 TAB UNDER TONGUE FOR CHEST PAIN - IF PAIN REMAINS AFTER 5 MIN, CALL 911 AND REPEAT DOSE. MAX 3 TABS IN 15 MINUTES 06/15/21   Chilton Si, MD  nortriptyline (PAMELOR) 10 MG capsule TAKE 1 CAPSULE BY MOUTH AT BEDTIME AS NEEDED FOR PAIN PREVENTION 11/24/22   Rodolph Bong, MD  pantoprazole (PROTONIX) 40 MG tablet TAKE 1 TABLET EVERY DAY 07/13/22   Shelva Majestic, MD  rosuvastatin (CRESTOR) 40 MG tablet TAKE 1 TABLET EVERY  DAY 08/08/22   Shelva Majestic, MD  sertraline (ZOLOFT) 50 MG tablet TAKE 2 TABLETS AT BEDTIME 10/13/22   Shelva Majestic, MD  SPIRIVA HANDIHALER 18 MCG inhalation capsule PLACE 1 CAPSULE INTO HANDIHALER AND INHALE THE CONTENTS DAILY 11/23/22   Shelva Majestic, MD  spironolactone (ALDACTONE) 25 MG tablet TAKE 1 TABLET EVERY DAY 08/22/22   Shelva Majestic, MD  TRUE METRIX BLOOD GLUCOSE TEST test strip TEST BLOOD SUGAR UP TO FOUR TIMES DAILY AS DIRECTED 06/29/22   Shelva Majestic, MD  TRUEplus Lancets 33G MISC TEST BLOOD SUGAR UP TO FOUR TIMES DAILY AS DIRECTED 06/29/22   Shelva Majestic, MD  vitamin B-12 (  CYANOCOBALAMIN) 1000 MCG tablet Take 1,000 mcg by mouth daily with breakfast.    [provider]  vitamin E 400 UNIT capsule Take 400 Units by mouth at bedtime.    [provider]      Allergies    Codeine sulfate and Morphine sulfate    Review of Systems   Review of Systems  Physical Exam Updated Vital Signs BP 126/71   Pulse 82   Temp 97.9 F (36.6 C) (Oral)   Resp 19   Ht 5\' 5"  (1.651 m)   Wt 80 kg   SpO2 96%   BMI 29.35 kg/m  Physical Exam Vitals and nursing note reviewed.  Cardiovascular:     Rate and Rhythm: Regular rhythm.  Abdominal:     Tenderness: There is abdominal tenderness.     Comments: Mild abdominal tenderness.  No rebound or guarding.  No hernia palpated.  Musculoskeletal:        General: No tenderness.  Skin:    General: Skin is warm.  Neurological:     Mental Status: She is alert. Mental status is at baseline.     ED Results / Procedures / Treatments   Labs (all labs ordered are listed, but only abnormal results are displayed) Labs Reviewed  COMPREHENSIVE METABOLIC PANEL - Abnormal; Notable for the following components:      Result Value   Sodium 134 (*)    CO2 20 (*)    Glucose, Bld 132 (*)    BUN 42 (*)    Creatinine, Ser 1.03 (*)    Alkaline Phosphatase 34 (*)    GFR, Estimated 52 (*)    All other components  within normal limits  CBC WITH DIFFERENTIAL/PLATELET - Abnormal; Notable for the following components:   Hemoglobin 11.6 (*)    All other components within normal limits  URINALYSIS, W/ REFLEX TO CULTURE (INFECTION SUSPECTED) - Abnormal; Notable for the following components:   APPearance HAZY (*)    Hgb urine dipstick LARGE (*)    Bacteria, UA RARE (*)    All other components within normal limits    EKG EKG Interpretation Date/Time:  Monday January 16 2023 12:01:07 EDT Ventricular Rate:  79 PR Interval:  233 QRS Duration:  104 QT Interval:  370 QTC Calculation: 425 R Axis:   -26  Text Interpretation: Sinus rhythm Multiple premature complexes, vent & supraven Prolonged PR interval Borderline left axis deviation Low voltage, extremity and precordial leads Confirmed by Benjiman Core [phone]) on 01/16/2023 1:07:11 PM  Radiology DG Abdomen Acute W/Chest  Result Date: 01/16/2023 CLINICAL DATA:  Abdominal pain.  Diarrhea. EXAM: DG ABDOMEN ACUTE WITH 1 VIEW CHEST COMPARISON:  Chest radiograph 03/02/2022 FINDINGS: The cardiomediastinal silhouette is unchanged. Aortic atherosclerosis is noted. There is mild chronic accentuation of the interstitial markings, however these are less prominent than on the prior study. No overt pulmonary edema, acute airspace consolidation, sizeable pleural effusion, or pneumothorax is identified. No intraperitoneal free air is identified. Gas is present in loops of small and large bowel without evidence of obstruction. An ordinary amount of stool is noted in the colon and rectum. No acute osseous abnormality is seen. IMPRESSION: No evidence of acute cardiopulmonary disease or bowel obstruction. Electronically Signed   By: Sebastian Ache M.D.   On: 01/16/2023 12:09    Procedures Procedures    Medications Ordered in ED Medications - No data to display  ED Course/ Medical Decision Making/ A&P  Medical Decision Making Amount and/or  Complexity of Data Reviewed Labs: ordered. Radiology: ordered.   Patient with mental status change.  Did have brief period of decreased mental status.  Now back to baseline.  History of dementia.  Differential diagnosis infection, dehydration, arrhythmia.  Reviewed previous admission for mental status change.  Blood work appears baseline.  Creatinine slightly increased as his BUN.  Hemoglobin mildly increased but also appears to baseline.  Urine does not show infection.  Acute abdominal series does not show obstruction.  Patient is feeling somewhat better and is actually eager to go home.  Has had a history of seizures and potentially could have been a seizure, however do not think any change in medication at this time.  Can follow-up with neurology as an outpatient.  Discharge home with patient's son.        Final Clinical Impression(s) / ED Diagnoses Final diagnoses:  Dementia, unspecified dementia severity, unspecified dementia type, unspecified whether behavioral, psychotic, or mood disturbance or anxiety [facility])    Rx / DC Orders ED Discharge Orders     None         Benjiman Core, MD 01/16/23 1308

## 2023-01-16 NOTE — ED Triage Notes (Signed)
Pt bib [facility] from home where her son said that she has been more confused this am than normal and thinks she has a UTI. Son could not give specific time per EMS  Upon calling son, pt had a possible seizure while she was getting her blood pressure taken this am. Son states she then went unresponsive for a couple minutes. Pt was not answering questions and had her eyes closed. Pt came around and was able to answer questions.  Hx of seizure with medication EMS VSS Pt arrives AOx3. Resp E/U

## 2023-01-16 NOTE — Telephone Encounter (Signed)
VOB initiated for Zilretta for BILAT knee OA 

## 2023-01-17 ENCOUNTER — Telehealth: Payer: Self-pay | Admitting: Family Medicine

## 2023-01-17 ENCOUNTER — Telehealth (HOSPITAL_BASED_OUTPATIENT_CLINIC_OR_DEPARTMENT_OTHER): Payer: Self-pay | Admitting: Cardiovascular Disease

## 2023-01-17 MED ORDER — NITROGLYCERIN 0.4 MG SL SUBL: SUBLINGUAL_TABLET | SUBLINGUAL | 6 refills | Status: DC

## 2023-01-17 NOTE — Telephone Encounter (Signed)
Rx request sent to pharmacy.  

## 2023-01-17 NOTE — Telephone Encounter (Signed)
FYI

## 2023-01-17 NOTE — Telephone Encounter (Signed)
We could get her in at 2 pm if she is still feeling well despite lower blood pressure  if that slot is still open to recheck blood pressure- can give her extra salt and have her hold off on any other blood pressure medications right now?

## 2023-01-17 NOTE — Telephone Encounter (Signed)
Called and spoke with pt son and Dr. Pamala Hurry recommendation was given, [name] will cb tomorrow with update.

## 2023-01-17 NOTE — Telephone Encounter (Signed)
When patient's son called initially he informed me that patient needed an ED follow up. At that time I offered caller 2pm slot with Dr. Durene Cal today but he declined stating that she would not be able to make that time. As conversation continued, caller informed me of low blood pressure readings to which I then transferred him and patient to triage nurse.

## 2023-01-17 NOTE — Telephone Encounter (Signed)
*  STAT* If patient is at the pharmacy, call can be transferred to refill team.   1. Which medications need to be refilled? (please list name of each medication and dose if known)  new prescription for Nitroglycerin  2. Which pharmacy/location (including street and city if local pharmacy) is medication to be sent to? [facility] [address] [place] , [place]. Do they need a 30 day or 90 day supply?

## 2023-01-17 NOTE — Telephone Encounter (Signed)
FYI: This call has been transferred to triage nurse: the Triage Nurse. Once the result note has been entered staff can address the message at that time.  Patient called in with the following symptoms:  Red Word: Low blood pressure --123/63, then 77/39; 30 minutes later 70/36--Asymptomatic per caller, patient's son.   Please advise at Mobile [phone] (mobile)  Message is routed to Provider Pool.

## 2023-01-17 NOTE — Telephone Encounter (Signed)
Final Outcome: Go to ED Now.   Patient Name First: Michelle Last: [name] Gender: Female DOB: 05/19/1933 Age: 89 Y 17 D Return Phone Number: [phone] (Primary), [phone] (Secondary) Address: City/ State/ Zip: [place]  [address] Client [facility] - [facility], [facility] Provider Tana Conch- MD Contact Type Call Who Is Calling Patient / Member / Family / Caregiver Call Type Triage / Clinical Caller Name [name] Relationship To Patient Son Return Phone Number [phone] (Primary) Chief Complaint BLOOD PRESSURE LOW - Systolic (top number) 90 or less Reason for Call Symptomatic / Request for Health Information Initial Comment Caller states his mom was discharged yesterday from the hospital and blood pressure has decreased 77/39 and 70/36. No other symptoms. Translation No Nurse Assessment Nurse: [name], RN, [name] Date/Time (Eastern Time): 01/17/2023 10:00:27 AM Confirm and document reason for call. If symptomatic, describe symptoms. ---Caller states that his mother passed out yesterday morning and he called 911 and they took her to the ED, they could not determine what happened, her BP was higher yesterday 122/68. This morning her BP is very low, 70/36, she says that she feels fine. Now it is 83/44 Does the patient have any new or worsening symptoms? ---Yes Will a triage be completed? ---Yes Related visit to physician within the last 2 weeks? ---No Does the PT have any chronic conditions? (i.e. diabetes, asthma, this includes High risk factors for pregnancy, etc.) ---Yes List chronic conditions. ---HTN, DM, dementia Is this a behavioral health or substance abuse call? ---No Guidelines Guideline Title Affirmed Question Affirmed Notes Nurse Date/Time (Eastern Time) Blood Pressure - Low [1] Drinking very little AND Standifer, RN, [name] 01/17/2023  10:02:06 AM Guidelines Guideline Title Affirmed Question Affirmed Notes Nurse Date/Time Lamount Cohen Time) [2] dehydration suspected (e.g., no urine > 12 hours, very dry mouth, very lightheaded) Disp. Time Lamount Cohen Time) Disposition Final User 01/17/2023 9:57:31 AM Send to Urgent Queue Bertram Savin 01/17/2023 10:10:01 AM Go to ED Now Yes [name], RN, Herbert Seta Final Disposition 01/17/2023 10:10:01 AM Go to ED Now Yes Standifer, RN, Sibyl Parr Disagree/Comply Comply Caller Understands Yes PreDisposition Call Doctor Care Advice Given Per Guideline GO TO ED NOW: * You need to be seen in the Emergency Department. NOTE TO TRIAGER - DRIVING: * Another adult should drive. CARE ADVICE given per Low Blood Pressure (Adult) guideline. Comments User: Tyrone Apple, RN Date/Time Lamount Cohen Time): 01/17/2023 10:10:53 AM Caller states that his mother is going to fight him on going to the ED. He will call his brother and see if he can get his mother to go to the ED

## 2023-01-18 NOTE — Telephone Encounter (Signed)
FYI

## 2023-01-18 NOTE — Telephone Encounter (Signed)
Prior Auth required for Zilretta for BILAT knee OA.   A prior authorization is required for this patient's plan.

## 2023-01-18 NOTE — Telephone Encounter (Signed)
Caller is patient's son. Caller states he did hold patient's BP medication last night but did give her the medication after 7:30 this morning. Readings given are listed below, starting from yesterday, 01/17/23. All readings were taken on patient's left arm with her seated, feet flat, and non verbal.  4:30 pm: 125/63 6 pm: 139/63 Ate dinner at about 6:30 pm  7:30 pm: 154/74 9:30 pm: 125/63 7:30 am: 139/84 (Took BP meds after this reading) 9 am: 100/48 10 am: 90/46 (No sx-- confusion expressed to be at baseline) 10:45 am: 153/51

## 2023-01-20 ENCOUNTER — Other Ambulatory Visit: Payer: Self-pay

## 2023-01-20 ENCOUNTER — Ambulatory Visit: Payer: Self-pay

## 2023-01-20 ENCOUNTER — Telehealth: Payer: Self-pay

## 2023-01-20 MED ORDER — DROPLET PEN NEEDLES 31G X 8 MM MISC: 4 refills | Status: DC

## 2023-01-20 NOTE — Chronic Care Management (AMB) (Signed)
   01/20/2023  JAQUETTA HARTRANFT 27-Oct-1932 [id number]   Reason for Encounter: Patient is not currently enrolled in the CCM program. CCM enrollment status changed to "Previously enrolled"   Katha Cabal [facility] Health/Chronic Care Management [phone]

## 2023-01-20 NOTE — Transitions of Care (Post Inpatient/ED Visit) (Signed)
01/20/2023  Name: Michelle [name] MRN: [id number] DOB: 14-Dec-1932  Today's TOC FU Call Status: Today's TOC FU Call Status:: Successful TOC FU Call Competed TOC FU Call Complete Date: 01/20/23 (Call completed with son-Randy.)  Red on EMMI-ED Discharge Alert Date & Reason:01/19/23 "Scheduled follow-up appt? No"   Transition Care Management Follow-up Telephone Call Date of Discharge: 01/16/23 Discharge Facility: [facility]) Type of Discharge: Emergency Department Reason for ED Visit: Other: ("dementia,unspecified") How have you been since you were released from the hospital?: Better Any questions or concerns?: No  Items Reviewed: Did you receive and understand the discharge instructions provided?: Yes Medications obtained,verified, and reconciled?: Yes (Medications Reviewed) Any new allergies since your discharge?: No Dietary orders reviewed?: Yes Type of Diet Ordered:: low salt/heart healthy/carb modified Do you have support at home?: Yes People in Home: child(ren), adult Name of Support/Comfort Primary Source: sons-Randy and [name]  Medications Reviewed Today: Medications Reviewed Today     Reviewed by Charlyn Minerva, RN (Registered Nurse) on 01/20/23 at 1510  Med List Status: <None>   Medication Order Taking? Sig Documenting Provider Last Dose Status Informant  acetaminophen (TYLENOL) 650 MG CR tablet [id number] Yes Take 1,300 mg by mouth 3 (three) times daily. Patient take 3 times a day. [provider] Taking Active Family Member  albuterol (PROVENTIL) (2.5 MG/3ML) 0.083% nebulizer solution [id number] Yes USE THREE MILLILITERS VIA NEBULIZATION BY MOUTH EVERY 6 HOURS AS NEEDED FOR WHEEZING OR SHORTNESS OF BREATH Shelva Majestic, MD Taking Active   albuterol (VENTOLIN HFA) 108 (90 Base) MCG/ACT inhaler [id number] Yes INHALE 1 PUFF EVERY 4 HOURS AS NEEDED FOR WHEEZING, SHORTNESS OF BREATH (RESCUE INHALER IF ADVAIR NOT WORKING) Shelva Majestic, MD Taking  Active   Alcohol Swabs (DROPSAFE ALCOHOL PREP) 70 % PADS [id number] Yes USE FOR TESTING THREE TIMES DAILY Shelva Majestic, MD Taking Active   amLODipine (NORVASC) 2.5 MG tablet [id number] Yes Take 2.5 mg in the morning. If afternoon blood pressure >145/90 take second tablet of 2.5 Shelva Majestic, MD Taking Active   aspirin 81 MG tablet [id number] Yes Take 81 mg by mouth daily with breakfast. [provider] Taking Active Family Member           Med Note Cyndie Chime, ANH T   Thu Aug 13, 2020  9:48 AM)    Blood Glucose Calibration (ACCU-CHEK SMARTVIEW CONTROL) Anise Salvo [id number] No   Patient not taking: Reported on 01/20/2023   [provider] Not Taking Active Family Member, Spouse/Significant Other  blood glucose meter kit and supplies KIT [id number] Yes Dispense based on patient and insurance preference. Use up to four times daily as directed. Dx:E11.9 Shelva Majestic, MD Taking Active   calcium carbonate (OSCAL) 1500 (600 Ca) MG TABS tablet [id number] Yes Take 600 mg of elemental calcium by mouth daily with breakfast. [provider] Taking Active Family Member  carvedilol (COREG) 12.5 MG tablet [id number] Yes Take 1 tablet (12.5 mg total) by mouth 2 (two) times daily. Alver Sorrow, NP Taking Active   cholecalciferol (VITAMIN D3) 25 MCG (1000 UNIT) tablet [id number] Yes Take 1,000 Units by mouth daily with breakfast. [provider] Taking Active Family Member  clopidogrel (PLAVIX) 75 MG tablet [id number] Yes TAKE 1 TABLET EVERY DAY Shelva Majestic, MD Taking Active   diclofenac Sodium (VOLTAREN) 1 % GEL [id number] Yes APPLY 2 GRAMS TOPICALLY TO AFFECTED AREA FOUR TIMES A DAY Shelva Majestic, MD Taking Active   enalapril (VASOTEC) 20 MG tablet  [id number] Yes TAKE 1 TABLET TWICE DAILY Shelva Majestic, MD Taking Active   fenofibrate micronized (LOFIBRA) 134 MG capsule [id number] Yes TAKE 1 CAPSULE EVERY DAY Shelva Majestic, MD Taking Active   ferrous sulfate 325  (65 FE) MG tablet [id number] Yes Twice a week  Patient taking differently: Take 325 mg by mouth 2 (two) times a week.   Shelva Majestic, MD Taking Active   fluticasone-salmeterol (ADVAIR) 250-50 MCG/ACT AEPB [id number] Yes INHALE 1 PUFF TWICE DAILY Shelva Majestic, MD Taking Active   folic acid (FOLVITE) 400 MCG tablet [id number] Yes Take 400 mcg by mouth daily with breakfast. [provider] Taking Active Family Member  furosemide (LASIX) 20 MG tablet [id number] Yes TAKE 1 TABLET EVERY DAY Alver Sorrow, NP Taking Active   hydrALAZINE (APRESOLINE) 50 MG tablet [id number] Yes Take 1 tablet (50 mg total) by mouth 3 (three) times daily. Chilton Si, MD Taking Active   icosapent Ethyl (VASCEPA) 1 g capsule [id number] Yes Take 2 capsules (2 g total) by mouth 2 (two) times daily. Alver Sorrow, NP Taking Active   insulin glargine (LANTUS SOLOSTAR) 100 UNIT/ML Solostar Pen [id number] Yes INJECT 17 TO 20 UNITS UNDER THE SKIN EVERY MORNING Shelva Majestic, MD Taking Active   Insulin Pen Needle (DROPLET PEN NEEDLES) 31G X 8 MM MISC [id number] Yes USE AS DIRECTED WITH LANTUS SOLOSTAR PEN. Dx. E11.9 Shelva Majestic, MD Taking Active   lamoTRIgine (LAMICTAL) 25 MG tablet [id number] Yes TAKE 1 TABLET TWICE DAILY Van Clines, MD Taking Active   Lancets Misc. (ACCU-CHEK FASTCLIX LANCET) KIT [id number] No 1 Device by Does not apply route. Use as directed for testing blood sugar  Patient not taking: Reported on 01/20/2023   [provider] Not Taking Active Family Member  Lancets Misc. (ACCU-CHEK FASTCLIX LANCET) KIT [id number] No Use as directed to test blood sugar  Patient not taking: Reported on 01/20/2023   Shelva Majestic, MD Not Taking Active Family Member  loratadine (CLARITIN) 10 MG tablet [id number] Yes Take 10 mg by mouth every other day. [provider] Taking Active Family Member  medroxyPROGESTERone (PROVERA) 10 MG tablet [id number] Yes Take 1 tablet (10 mg total)  by mouth daily. Warner Mccreedy D, NP Taking Active   Melatonin 5 MG CAPS [id number] Yes Take 5 mg by mouth at bedtime. [provider] Taking Active Family Member  metFORMIN (GLUCOPHAGE-XR) 500 MG 24 hr tablet [id number] Yes Take 1 tablet (500 mg total) by mouth daily with breakfast. Shelva Majestic, MD Taking Active   nitroGLYCERIN (NITROSTAT) 0.4 MG SL tablet [id number] Yes DISSOLVE 1 TAB UNDER TONGUE FOR CHEST PAIN - IF PAIN REMAINS AFTER 5 MIN, CALL 911 AND REPEAT DOSE. MAX 3 TABS IN 15 MINUTES Duke Salvia, Elmarie Shiley, MD Taking Active   nortriptyline (PAMELOR) 10 MG capsule [id number] Yes TAKE 1 CAPSULE BY MOUTH AT BEDTIME AS NEEDED FOR PAIN PREVENTION Rodolph Bong, MD Taking Active   pantoprazole (PROTONIX) 40 MG tablet [id number] Yes TAKE 1 TABLET EVERY DAY Shelva Majestic, MD Taking Active   psyllium (METAMUCIL) 58.6 % packet [id number] Yes Take 1 packet by mouth daily. [provider] Taking Active Child  rosuvastatin (CRESTOR) 40 MG tablet [id number] Yes TAKE 1 TABLET EVERY DAY Shelva Majestic, MD Taking Active   sertraline (ZOLOFT) 50 MG tablet [id number] Yes TAKE 2 TABLETS AT BEDTIME Shelva Majestic, MD Taking Active   [facility] HANDIHALER 18 MCG inhalation capsule [id number] Yes PLACE 1 CAPSULE INTO  HANDIHALER AND INHALE THE CONTENTS DAILY Shelva Majestic, MD Taking Active   spironolactone (ALDACTONE) 25 MG tablet [id number] Yes TAKE 1 TABLET EVERY DAY Shelva Majestic, MD Taking Active   TRUE METRIX BLOOD GLUCOSE TEST test strip [id number] Yes TEST BLOOD SUGAR UP TO FOUR TIMES DAILY AS DIRECTED Shelva Majestic, MD Taking Active   TRUEplus Lancets 33G MISC [id number] Yes TEST BLOOD SUGAR UP TO FOUR TIMES DAILY AS DIRECTED Shelva Majestic, MD Taking Active   vitamin B-12 (CYANOCOBALAMIN) 1000 MCG tablet [id number] Yes Take 1,000 mcg by mouth daily with breakfast. [provider] Taking Active Family Member  vitamin E 400 UNIT capsule [id number] Yes Take 400 Units by  mouth at bedtime. [provider] Taking Active Family Member            Home Care and Equipment/Supplies: Were Home Health Services Ordered?: NA Any new equipment or medical supplies ordered?: NA  Functional Questionnaire: Do you need assistance with bathing/showering or dressing?: Yes Do you need assistance with meal preparation?: Yes Do you need assistance with eating?: No Do you have difficulty maintaining continence: Yes Do you need assistance with getting out of bed/getting out of a chair/moving?: Yes Do you have difficulty managing or taking your medications?: Yes (son manages meds for pt)  Follow up appointments reviewed: PCP Follow-up appointment confirmed?: No (Son states he called office-no appts with PCP for over a month-does not want to see another provider-care guide checked MD schedule during call and no appts unitl Aug-son will f/u with office next week about openings-pt already placed on wait list) MD Provider Line Number:[phone] Given: No Specialist Hospital Follow-up appointment confirmed?: Yes Date of Specialist follow-up appointment?: 02/24/23 Follow-Up Specialty Provider:: Dr. Karel Jarvis Do you need transportation to your follow-up appointment?: No (son confirms that they take pt to appts) Do you understand care options if your condition(s) worsen?: Yes-patient verbalized understanding  SDOH Interventions Today    Flowsheet Row Most Recent Value  SDOH Interventions   Food Insecurity Interventions Intervention Not Indicated  Transportation Interventions Intervention Not Indicated      TOC Interventions Today    Flowsheet Row Most Recent Value  TOC Interventions   TOC Interventions Discussed/Reviewed TOC Interventions Discussed      Interventions Today    Flowsheet Row Most Recent Value  Chronic Disease   Chronic disease during today's visit Hypertension (HTN)  General Interventions   General Interventions Discussed/Reviewed General  Interventions Discussed, Doctor Visits, Durable Medical Equipment (DME)  Doctor Visits Discussed/Reviewed Doctor Visits Discussed, Doctor Visits Reviewed, PCP, Specialist  Durable Medical Equipment (DME) BP Cuff, Glucomoter  [BP readings have been normal since ED visit per son-BP 145/75 today and blood sugar 110-fasting]  PCP/Specialist Visits Compliance with follow-up visit  Education Interventions   Education Provided Provided Education  Provided Verbal Education On When to see the doctor, Nutrition, Medication, Other, Blood Sugar Monitoring  [bowel regimen discussed-son giving pt Metamucil daily-will try every other day to see if diarrhea improves-reports 2 loose stools today-he is making sure pt stays hydrated]  Nutrition Interventions   Nutrition Discussed/Reviewed Nutrition Discussed, Adding fruits and vegetables, Increasing proteins, Decreasing fats, Decreasing salt, Fluid intake, Decreasing sugar intake  Pharmacy Interventions   Pharmacy Dicussed/Reviewed Pharmacy Topics Discussed, Medications and their functions  Safety Interventions   Safety Discussed/Reviewed Safety Discussed, Home Safety       Alessandra Grout [facility] Health/[facility] Care Management Care Management Community Coordinator Direct Phone: [phone] Toll Free: [phone] Fax: [phone]

## 2023-01-23 DIAGNOSIS — M170 Bilateral primary osteoarthritis of knee: Secondary | ICD-10-CM | POA: Diagnosis not present

## 2023-01-23 DIAGNOSIS — I130 Hypertensive heart and chronic kidney disease with heart failure and stage 1 through stage 4 chronic kidney disease, or unspecified chronic kidney disease: Secondary | ICD-10-CM | POA: Diagnosis not present

## 2023-01-23 DIAGNOSIS — N183 Chronic kidney disease, stage 3 unspecified: Secondary | ICD-10-CM | POA: Diagnosis not present

## 2023-01-23 DIAGNOSIS — E1122 Type 2 diabetes mellitus with diabetic chronic kidney disease: Secondary | ICD-10-CM | POA: Diagnosis not present

## 2023-01-23 DIAGNOSIS — M545 Low back pain, unspecified: Secondary | ICD-10-CM | POA: Diagnosis not present

## 2023-01-23 DIAGNOSIS — F0284 Dementia in other diseases classified elsewhere, unspecified severity, with anxiety: Secondary | ICD-10-CM | POA: Diagnosis not present

## 2023-01-23 DIAGNOSIS — G40909 Epilepsy, unspecified, not intractable, without status epilepticus: Secondary | ICD-10-CM | POA: Diagnosis not present

## 2023-01-23 DIAGNOSIS — I5032 Chronic diastolic (congestive) heart failure: Secondary | ICD-10-CM | POA: Diagnosis not present

## 2023-01-23 DIAGNOSIS — G8929 Other chronic pain: Secondary | ICD-10-CM | POA: Diagnosis not present

## 2023-01-24 NOTE — Telephone Encounter (Addendum)
Approval on file.

## 2023-01-27 NOTE — Telephone Encounter (Signed)
Zilretta for BILAT knee OA Can consider repeat on or after 02/07/23   Primary Insurance: [employer] Medicare Co-pay: $15  Co-insurance: n/a Deductible: does not apply  Prior Auth: APPROVED KeyTula Nakayama PA Case ID #: [id number] Valid: 11/08/22-05/10/23   Knee Injection History: 04/14/22 - BILAT Zilretta 08/01/22 - BILAT Kenalog 08/16/22 - BILAT Zilretta 11/14/22 - BILAT Zilretta

## 2023-01-28 ENCOUNTER — Other Ambulatory Visit: Payer: Self-pay | Admitting: Family Medicine

## 2023-01-28 NOTE — Telephone Encounter (Signed)
Have there been a lot of lows like that? The 90 and 100 reading? If so I may need to reduce her medicines overall- good job on their part for adjusting medications

## 2023-01-30 NOTE — Telephone Encounter (Signed)
In the setting of diarrhea please hold her metformin and enalapril until these resolve unless blood pressure above 150 on the top.  She can try Imodium over-the-counter but otherwise will need visit if not resolving or certainly if worsens or if she has fevers or blood in the stool

## 2023-01-30 NOTE — Telephone Encounter (Signed)
Called and spoke with Pt son and he states there have been a few readings in the 90s-100s. Pt son also states pt has had dirrhea for a week all liquid stool and nothing has slowed it up. He wants to know if pt needs to be seen or if something OTC can be suggested.

## 2023-01-30 NOTE — Telephone Encounter (Signed)
 Rx refill request approved per Dr. Corey's orders. 

## 2023-01-31 NOTE — Telephone Encounter (Signed)
Holding until needed by patient.

## 2023-01-31 NOTE — Telephone Encounter (Signed)
Called and spoke with pt son and below message given.

## 2023-02-06 ENCOUNTER — Inpatient Hospital Stay (HOSPITAL_COMMUNITY): Payer: Medicare HMO

## 2023-02-06 ENCOUNTER — Emergency Department (HOSPITAL_COMMUNITY): Payer: Medicare HMO

## 2023-02-06 ENCOUNTER — Inpatient Hospital Stay (HOSPITAL_COMMUNITY)
Admission: EM | Admit: 2023-02-06 | Discharge: 2023-02-18 | DRG: 100 | Disposition: A | Discharge status: Skilled Nursing Facility | Payer: Medicare HMO | Attending: Internal Medicine | Admitting: Internal Medicine

## 2023-02-06 DIAGNOSIS — F32A Depression, unspecified: Secondary | ICD-10-CM | POA: Diagnosis present

## 2023-02-06 DIAGNOSIS — S8012XA Contusion of left lower leg, initial encounter: Secondary | ICD-10-CM | POA: Diagnosis present

## 2023-02-06 DIAGNOSIS — E876 Hypokalemia: Secondary | ICD-10-CM | POA: Diagnosis present

## 2023-02-06 DIAGNOSIS — R339 Retention of urine, unspecified: Secondary | ICD-10-CM | POA: Diagnosis not present

## 2023-02-06 DIAGNOSIS — R1312 Dysphagia, oropharyngeal phase: Secondary | ICD-10-CM | POA: Diagnosis not present

## 2023-02-06 DIAGNOSIS — M6281 Muscle weakness (generalized): Secondary | ICD-10-CM | POA: Diagnosis not present

## 2023-02-06 DIAGNOSIS — E669 Obesity, unspecified: Secondary | ICD-10-CM | POA: Diagnosis present

## 2023-02-06 DIAGNOSIS — R0602 Shortness of breath: Secondary | ICD-10-CM | POA: Diagnosis not present

## 2023-02-06 DIAGNOSIS — Z794 Long term (current) use of insulin: Secondary | ICD-10-CM | POA: Diagnosis not present

## 2023-02-06 DIAGNOSIS — I4891 Unspecified atrial fibrillation: Secondary | ICD-10-CM | POA: Diagnosis not present

## 2023-02-06 DIAGNOSIS — W1811XA Fall from or off toilet without subsequent striking against object, initial encounter: Secondary | ICD-10-CM | POA: Diagnosis present

## 2023-02-06 DIAGNOSIS — R531 Weakness: Secondary | ICD-10-CM | POA: Diagnosis not present

## 2023-02-06 DIAGNOSIS — R41841 Cognitive communication deficit: Secondary | ICD-10-CM | POA: Diagnosis not present

## 2023-02-06 DIAGNOSIS — F05 Delirium due to known physiological condition: Secondary | ICD-10-CM | POA: Diagnosis not present

## 2023-02-06 DIAGNOSIS — N390 Urinary tract infection, site not specified: Secondary | ICD-10-CM | POA: Diagnosis present

## 2023-02-06 DIAGNOSIS — R000 Tachycardia, unspecified: Secondary | ICD-10-CM | POA: Diagnosis not present

## 2023-02-06 DIAGNOSIS — Z9911 Dependence on respirator [ventilator] status: Secondary | ICD-10-CM | POA: Diagnosis not present

## 2023-02-06 DIAGNOSIS — D638 Anemia in other chronic diseases classified elsewhere: Secondary | ICD-10-CM | POA: Diagnosis present

## 2023-02-06 DIAGNOSIS — M25562 Pain in left knee: Secondary | ICD-10-CM | POA: Diagnosis present

## 2023-02-06 DIAGNOSIS — R569 Unspecified convulsions: Secondary | ICD-10-CM | POA: Diagnosis not present

## 2023-02-06 DIAGNOSIS — R2681 Unsteadiness on feet: Secondary | ICD-10-CM | POA: Diagnosis not present

## 2023-02-06 DIAGNOSIS — E8721 Acute metabolic acidosis: Secondary | ICD-10-CM | POA: Diagnosis not present

## 2023-02-06 DIAGNOSIS — I10 Essential (primary) hypertension: Secondary | ICD-10-CM | POA: Diagnosis not present

## 2023-02-06 DIAGNOSIS — I5033 Acute on chronic diastolic (congestive) heart failure: Secondary | ICD-10-CM | POA: Diagnosis not present

## 2023-02-06 DIAGNOSIS — S0990XA Unspecified injury of head, initial encounter: Secondary | ICD-10-CM

## 2023-02-06 DIAGNOSIS — S0101XA Laceration without foreign body of scalp, initial encounter: Secondary | ICD-10-CM | POA: Diagnosis present

## 2023-02-06 DIAGNOSIS — Z23 Encounter for immunization: Secondary | ICD-10-CM | POA: Diagnosis not present

## 2023-02-06 DIAGNOSIS — Z043 Encounter for examination and observation following other accident: Secondary | ICD-10-CM | POA: Diagnosis not present

## 2023-02-06 DIAGNOSIS — Z66 Do not resuscitate: Secondary | ICD-10-CM | POA: Diagnosis present

## 2023-02-06 DIAGNOSIS — R4182 Altered mental status, unspecified: Secondary | ICD-10-CM | POA: Diagnosis not present

## 2023-02-06 DIAGNOSIS — J449 Chronic obstructive pulmonary disease, unspecified: Secondary | ICD-10-CM | POA: Diagnosis present

## 2023-02-06 DIAGNOSIS — M1712 Unilateral primary osteoarthritis, left knee: Secondary | ICD-10-CM | POA: Diagnosis not present

## 2023-02-06 DIAGNOSIS — Z9049 Acquired absence of other specified parts of digestive tract: Secondary | ICD-10-CM

## 2023-02-06 DIAGNOSIS — E860 Dehydration: Secondary | ICD-10-CM | POA: Diagnosis present

## 2023-02-06 DIAGNOSIS — F0393 Unspecified dementia, unspecified severity, with mood disturbance: Secondary | ICD-10-CM | POA: Diagnosis not present

## 2023-02-06 DIAGNOSIS — L89152 Pressure ulcer of sacral region, stage 2: Secondary | ICD-10-CM | POA: Diagnosis present

## 2023-02-06 DIAGNOSIS — R1311 Dysphagia, oral phase: Secondary | ICD-10-CM | POA: Diagnosis present

## 2023-02-06 DIAGNOSIS — I110 Hypertensive heart disease with heart failure: Secondary | ICD-10-CM | POA: Diagnosis present

## 2023-02-06 DIAGNOSIS — S3993XA Unspecified injury of pelvis, initial encounter: Secondary | ICD-10-CM | POA: Diagnosis not present

## 2023-02-06 DIAGNOSIS — Z885 Allergy status to narcotic agent status: Secondary | ICD-10-CM

## 2023-02-06 DIAGNOSIS — Z7984 Long term (current) use of oral hypoglycemic drugs: Secondary | ICD-10-CM

## 2023-02-06 DIAGNOSIS — Z79899 Other long term (current) drug therapy: Secondary | ICD-10-CM

## 2023-02-06 DIAGNOSIS — I252 Old myocardial infarction: Secondary | ICD-10-CM

## 2023-02-06 DIAGNOSIS — E785 Hyperlipidemia, unspecified: Secondary | ICD-10-CM | POA: Diagnosis present

## 2023-02-06 DIAGNOSIS — Z515 Encounter for palliative care: Secondary | ICD-10-CM | POA: Diagnosis not present

## 2023-02-06 DIAGNOSIS — S199XXA Unspecified injury of neck, initial encounter: Secondary | ICD-10-CM | POA: Diagnosis not present

## 2023-02-06 DIAGNOSIS — R296 Repeated falls: Secondary | ICD-10-CM | POA: Diagnosis present

## 2023-02-06 DIAGNOSIS — W19XXXA Unspecified fall, initial encounter: Principal | ICD-10-CM

## 2023-02-06 DIAGNOSIS — I700 Atherosclerosis of aorta: Secondary | ICD-10-CM | POA: Diagnosis not present

## 2023-02-06 DIAGNOSIS — Z955 Presence of coronary angioplasty implant and graft: Secondary | ICD-10-CM

## 2023-02-06 DIAGNOSIS — E119 Type 2 diabetes mellitus without complications: Secondary | ICD-10-CM | POA: Diagnosis not present

## 2023-02-06 DIAGNOSIS — I6900 Unspecified sequelae of nontraumatic subarachnoid hemorrhage: Secondary | ICD-10-CM | POA: Diagnosis not present

## 2023-02-06 DIAGNOSIS — S40012A Contusion of left shoulder, initial encounter: Secondary | ICD-10-CM | POA: Diagnosis not present

## 2023-02-06 DIAGNOSIS — R6889 Other general symptoms and signs: Secondary | ICD-10-CM | POA: Diagnosis not present

## 2023-02-06 DIAGNOSIS — G40109 Localization-related (focal) (partial) symptomatic epilepsy and epileptic syndromes with simple partial seizures, not intractable, without status epilepticus: Secondary | ICD-10-CM | POA: Diagnosis not present

## 2023-02-06 DIAGNOSIS — S299XXA Unspecified injury of thorax, initial encounter: Secondary | ICD-10-CM | POA: Diagnosis not present

## 2023-02-06 DIAGNOSIS — D500 Iron deficiency anemia secondary to blood loss (chronic): Secondary | ICD-10-CM | POA: Diagnosis present

## 2023-02-06 DIAGNOSIS — Z800 Family history of malignant neoplasm of digestive organs: Secondary | ICD-10-CM

## 2023-02-06 DIAGNOSIS — Z7982 Long term (current) use of aspirin: Secondary | ICD-10-CM

## 2023-02-06 DIAGNOSIS — R918 Other nonspecific abnormal finding of lung field: Secondary | ICD-10-CM | POA: Diagnosis not present

## 2023-02-06 DIAGNOSIS — N939 Abnormal uterine and vaginal bleeding, unspecified: Secondary | ICD-10-CM | POA: Diagnosis present

## 2023-02-06 DIAGNOSIS — G4089 Other seizures: Secondary | ICD-10-CM | POA: Diagnosis not present

## 2023-02-06 DIAGNOSIS — I959 Hypotension, unspecified: Secondary | ICD-10-CM | POA: Diagnosis present

## 2023-02-06 DIAGNOSIS — Z8249 Family history of ischemic heart disease and other diseases of the circulatory system: Secondary | ICD-10-CM

## 2023-02-06 DIAGNOSIS — Z7951 Long term (current) use of inhaled steroids: Secondary | ICD-10-CM

## 2023-02-06 DIAGNOSIS — I493 Ventricular premature depolarization: Secondary | ICD-10-CM | POA: Diagnosis present

## 2023-02-06 DIAGNOSIS — Z87891 Personal history of nicotine dependence: Secondary | ICD-10-CM

## 2023-02-06 DIAGNOSIS — R911 Solitary pulmonary nodule: Secondary | ICD-10-CM | POA: Diagnosis present

## 2023-02-06 DIAGNOSIS — Z781 Physical restraint status: Secondary | ICD-10-CM

## 2023-02-06 DIAGNOSIS — R58 Hemorrhage, not elsewhere classified: Secondary | ICD-10-CM | POA: Diagnosis not present

## 2023-02-06 DIAGNOSIS — Z833 Family history of diabetes mellitus: Secondary | ICD-10-CM

## 2023-02-06 DIAGNOSIS — S069X1D Unspecified intracranial injury with loss of consciousness of 30 minutes or less, subsequent encounter: Secondary | ICD-10-CM | POA: Diagnosis not present

## 2023-02-06 DIAGNOSIS — Z6830 Body mass index (BMI) 30.0-30.9, adult: Secondary | ICD-10-CM

## 2023-02-06 DIAGNOSIS — K922 Gastrointestinal hemorrhage, unspecified: Secondary | ICD-10-CM | POA: Diagnosis present

## 2023-02-06 DIAGNOSIS — J9601 Acute respiratory failure with hypoxia: Secondary | ICD-10-CM | POA: Diagnosis not present

## 2023-02-06 DIAGNOSIS — I2510 Atherosclerotic heart disease of native coronary artery without angina pectoris: Secondary | ICD-10-CM | POA: Diagnosis present

## 2023-02-06 DIAGNOSIS — G319 Degenerative disease of nervous system, unspecified: Secondary | ICD-10-CM | POA: Diagnosis not present

## 2023-02-06 DIAGNOSIS — Z7189 Other specified counseling: Secondary | ICD-10-CM | POA: Diagnosis not present

## 2023-02-06 DIAGNOSIS — I428 Other cardiomyopathies: Secondary | ICD-10-CM | POA: Diagnosis not present

## 2023-02-06 DIAGNOSIS — D4959 Neoplasm of unspecified behavior of other genitourinary organ: Secondary | ICD-10-CM | POA: Diagnosis present

## 2023-02-06 DIAGNOSIS — Z7902 Long term (current) use of antithrombotics/antiplatelets: Secondary | ICD-10-CM

## 2023-02-06 DIAGNOSIS — Z4682 Encounter for fitting and adjustment of non-vascular catheter: Secondary | ICD-10-CM | POA: Diagnosis not present

## 2023-02-06 DIAGNOSIS — E041 Nontoxic single thyroid nodule: Secondary | ICD-10-CM | POA: Diagnosis not present

## 2023-02-06 DIAGNOSIS — R0902 Hypoxemia: Secondary | ICD-10-CM | POA: Diagnosis not present

## 2023-02-06 DIAGNOSIS — Z7901 Long term (current) use of anticoagulants: Secondary | ICD-10-CM

## 2023-02-06 DIAGNOSIS — Z7401 Bed confinement status: Secondary | ICD-10-CM | POA: Diagnosis not present

## 2023-02-06 LAB — BASIC METABOLIC PANEL
Anion gap: 11 (ref 5–15)
BUN: 21 mg/dL (ref 8–23)
CO2: 15 mmol/L — ABNORMAL LOW (ref 22–32)
Calcium: 8.5 mg/dL — ABNORMAL LOW (ref 8.9–10.3)
Chloride: 109 mmol/L (ref 98–111)
Creatinine, Ser: 0.86 mg/dL (ref 0.44–1.00)
GFR, Estimated: >60 mL/min (ref >60)
Glucose, Bld: 114 mg/dL — ABNORMAL HIGH (ref 70–99)
Potassium: 4.1 mmol/L (ref 3.5–5.1)
Sodium: 135 mmol/L (ref 135–145)

## 2023-02-06 LAB — POCT I-STAT 7, (LYTES, BLD GAS, ICA,H+H)
Acid-base deficit: 7 mmol/L — ABNORMAL HIGH (ref 0.0–2.0)
Bicarbonate: 19.4 mmol/L — ABNORMAL LOW (ref 20.0–28.0)
Calcium, Ion: 1.31 mmol/L (ref 1.15–1.40)
HCT: 31 % — ABNORMAL LOW (ref 36.0–46.0)
Hemoglobin: 10.5 g/dL — ABNORMAL LOW (ref 12.0–15.0)
O2 Saturation: 97 %
Patient temperature: 99.1
Potassium: 3.5 mmol/L (ref 3.5–5.1)
Sodium: 138 mmol/L (ref 135–145)
TCO2: 21 mmol/L — ABNORMAL LOW (ref 22–32)
pCO2 arterial: 42.9 mmHg (ref 32–48)
pH, Arterial: 7.264 — ABNORMAL LOW (ref 7.35–7.45)
pO2, Arterial: 104 mmHg (ref 83–108)

## 2023-02-06 LAB — I-STAT ARTERIAL BLOOD GAS, ED
Acid-base deficit: 8 mmol/L — ABNORMAL HIGH (ref 0.0–2.0)
Bicarbonate: 18.4 mmol/L — ABNORMAL LOW (ref 20.0–28.0)
Calcium, Ion: 1.3 mmol/L (ref 1.15–1.40)
HCT: 32 % — ABNORMAL LOW (ref 36.0–46.0)
Hemoglobin: 10.9 g/dL — ABNORMAL LOW (ref 12.0–15.0)
O2 Saturation: 100 %
Patient temperature: 97.4
Potassium: 3.5 mmol/L (ref 3.5–5.1)
Sodium: 138 mmol/L (ref 135–145)
TCO2: 20 mmol/L — ABNORMAL LOW (ref 22–32)
pCO2 arterial: 38.8 mmHg (ref 32–48)
pH, Arterial: 7.28 — ABNORMAL LOW (ref 7.35–7.45)
pO2, Arterial: 360 mmHg — ABNORMAL HIGH (ref 83–108)

## 2023-02-06 LAB — CBC
HCT: 35.8 % — ABNORMAL LOW (ref 36.0–46.0)
HCT: 31 % — ABNORMAL LOW (ref 36.0–46.0)
Hemoglobin: 11.4 g/dL — ABNORMAL LOW (ref 12.0–15.0)
Hemoglobin: 10.4 g/dL — ABNORMAL LOW (ref 12.0–15.0)
MCH: 29.2 pg (ref 26.0–34.0)
MCH: 30 pg (ref 26.0–34.0)
MCHC: 31.8 g/dL (ref 30.0–36.0)
MCHC: 33.5 g/dL (ref 30.0–36.0)
MCV: 91.6 fL (ref 80.0–100.0)
MCV: 89.3 fL (ref 80.0–100.0)
Platelets: 287 10*3/uL (ref 150–400)
Platelets: 215 10*3/uL (ref 150–400)
RBC: 3.91 MIL/uL (ref 3.87–5.11)
RBC: 3.47 MIL/uL — ABNORMAL LOW (ref 3.87–5.11)
RDW: 14 % (ref 11.5–15.5)
RDW: 14 % (ref 11.5–15.5)
WBC: 8.3 10*3/uL (ref 4.0–10.5)
WBC: 12.6 10*3/uL — ABNORMAL HIGH (ref 4.0–10.5)
nRBC: 0 % (ref 0.0–0.2)
nRBC: 0 % (ref 0.0–0.2)

## 2023-02-06 LAB — GLUCOSE, CAPILLARY
Glucose-Capillary: 148 mg/dL — ABNORMAL HIGH (ref 70–99)
Glucose-Capillary: 134 mg/dL — ABNORMAL HIGH (ref 70–99)
Glucose-Capillary: 116 mg/dL — ABNORMAL HIGH (ref 70–99)
Glucose-Capillary: 122 mg/dL — ABNORMAL HIGH (ref 70–99)
Glucose-Capillary: 100 mg/dL — ABNORMAL HIGH (ref 70–99)
Glucose-Capillary: 118 mg/dL — ABNORMAL HIGH (ref 70–99)

## 2023-02-06 LAB — URINALYSIS, ROUTINE W REFLEX MICROSCOPIC
Bilirubin Urine: NEGATIVE
Glucose, UA: NEGATIVE mg/dL
Hgb urine dipstick: NEGATIVE
Ketones, ur: NEGATIVE mg/dL
Leukocytes,Ua: NEGATIVE
Nitrite: NEGATIVE
Protein, ur: 30 mg/dL — AB
Specific Gravity, Urine: 1.029 (ref 1.005–1.030)
pH: 5 (ref 5.0–8.0)

## 2023-02-06 LAB — COMPREHENSIVE METABOLIC PANEL
ALT: 16 U/L (ref 0–44)
AST: 21 U/L (ref 15–41)
Albumin: 3.3 g/dL — ABNORMAL LOW (ref 3.5–5.0)
Alkaline Phosphatase: 36 U/L — ABNORMAL LOW (ref 38–126)
Anion gap: 11 (ref 5–15)
BUN: 31 mg/dL — ABNORMAL HIGH (ref 8–23)
CO2: 18 mmol/L — ABNORMAL LOW (ref 22–32)
Calcium: 9 mg/dL (ref 8.9–10.3)
Chloride: 105 mmol/L (ref 98–111)
Creatinine, Ser: 1.06 mg/dL — ABNORMAL HIGH (ref 0.44–1.00)
GFR, Estimated: 51 mL/min — ABNORMAL LOW (ref >60)
Glucose, Bld: 184 mg/dL — ABNORMAL HIGH (ref 70–99)
Potassium: 3.3 mmol/L — ABNORMAL LOW (ref 3.5–5.1)
Sodium: 134 mmol/L — ABNORMAL LOW (ref 135–145)
Total Bilirubin: 0.5 mg/dL (ref 0.3–1.2)
Total Protein: 6.7 g/dL (ref 6.5–8.1)

## 2023-02-06 LAB — PHOSPHORUS
Phosphorus: 1.8 mg/dL — ABNORMAL LOW (ref 2.5–4.6)
Phosphorus: 1.6 mg/dL — ABNORMAL LOW (ref 2.5–4.6)

## 2023-02-06 LAB — I-STAT CHEM 8, ED
BUN: 30 mg/dL — ABNORMAL HIGH (ref 8–23)
Calcium, Ion: 1.2 mmol/L (ref 1.15–1.40)
Chloride: 107 mmol/L (ref 98–111)
Creatinine, Ser: 1 mg/dL (ref 0.44–1.00)
Glucose, Bld: 174 mg/dL — ABNORMAL HIGH (ref 70–99)
HCT: 34 % — ABNORMAL LOW (ref 36.0–46.0)
Hemoglobin: 11.6 g/dL — ABNORMAL LOW (ref 12.0–15.0)
Potassium: 3.4 mmol/L — ABNORMAL LOW (ref 3.5–5.1)
Sodium: 138 mmol/L (ref 135–145)
TCO2: 16 mmol/L — ABNORMAL LOW (ref 22–32)

## 2023-02-06 LAB — RAPID URINE DRUG SCREEN, HOSP PERFORMED
Amphetamines: NOT DETECTED
Barbiturates: NOT DETECTED
Benzodiazepines: POSITIVE — AB
Cocaine: NOT DETECTED
Opiates: NOT DETECTED
Tetrahydrocannabinol: NOT DETECTED

## 2023-02-06 LAB — LACTIC ACID, PLASMA: Lactic Acid, Venous: 1.8 mmol/L (ref 0.5–1.9)

## 2023-02-06 LAB — AMMONIA: Ammonia: <10 umol/L (ref 9–35)

## 2023-02-06 LAB — SAMPLE TO BLOOD BANK

## 2023-02-06 LAB — MAGNESIUM
Magnesium: 1.1 mg/dL — ABNORMAL LOW (ref 1.7–2.4)
Magnesium: 2.3 mg/dL (ref 1.7–2.4)

## 2023-02-06 LAB — MRSA NEXT GEN BY PCR, NASAL: MRSA by PCR Next Gen: NOT DETECTED

## 2023-02-06 LAB — PROTIME-INR
INR: 1.2 (ref 0.8–1.2)
Prothrombin Time: 15.7 seconds — ABNORMAL HIGH (ref 11.4–15.2)

## 2023-02-06 LAB — ETHANOL: Alcohol, Ethyl (B): <10 mg/dL (ref <10)

## 2023-02-06 LAB — I-STAT CG4 LACTIC ACID, ED: Lactic Acid, Venous: 3 mmol/L (ref 0.5–1.9)

## 2023-02-06 MED ORDER — TETANUS-DIPHTH-ACELL PERTUSSIS 5-2.5-18.5 LF-MCG/0.5 IM SUSY
PREFILLED_SYRINGE | INTRAMUSCULAR | Status: AC
  Administered 2023-02-06: 0.5 mL via INTRAMUSCULAR
  Filled 2023-02-06: qty 0.5

## 2023-02-06 MED ORDER — ORAL CARE MOUTH RINSE
15.0000 mL | OROMUCOSAL | Status: DC
  Administered 2023-02-06 – 2023-02-18 (×43): 15 mL via OROMUCOSAL

## 2023-02-06 MED ORDER — POTASSIUM CHLORIDE 10 MEQ/100ML IV SOLN
10.0000 meq | Freq: Once | INTRAVENOUS | Status: AC
  Administered 2023-02-06: 10 meq via INTRAVENOUS
  Filled 2023-02-06: qty 100

## 2023-02-06 MED ORDER — PROPOFOL 1000 MG/100ML IV EMUL
INTRAVENOUS | Status: AC | PRN
  Administered 2023-02-06: 20 ug/kg/min via INTRAVENOUS

## 2023-02-06 MED ORDER — ETOMIDATE 2 MG/ML IV SOLN
INTRAVENOUS | Status: AC | PRN
  Administered 2023-02-06: 25 mg via INTRAVENOUS

## 2023-02-06 MED ORDER — LABETALOL HCL 5 MG/ML IV SOLN
5.0000 mg | Freq: Once | INTRAVENOUS | Status: AC
  Administered 2023-02-06: 5 mg via INTRAVENOUS
  Filled 2023-02-06: qty 4

## 2023-02-06 MED ORDER — SODIUM CHLORIDE 0.9 % IV SOLN
INTRAVENOUS | Status: AC | PRN
  Administered 2023-02-06: 999 mL/h via INTRAVENOUS

## 2023-02-06 MED ORDER — FAMOTIDINE 20 MG PO TABS
20.0000 mg | ORAL_TABLET | Freq: Two times a day (BID) | ORAL | Status: DC
  Administered 2023-02-07: 20 mg
  Filled 2023-02-06: qty 1

## 2023-02-06 MED ORDER — ENOXAPARIN SODIUM 40 MG/0.4ML IJ SOSY: 40.0000 mg | PREFILLED_SYRINGE | INTRAMUSCULAR | Status: DC

## 2023-02-06 MED ORDER — MAGNESIUM SULFATE 4 GM/100ML IV SOLN
4.0000 g | Freq: Once | INTRAVENOUS | Status: AC
  Administered 2023-02-06: 4 g via INTRAVENOUS
  Filled 2023-02-06: qty 100

## 2023-02-06 MED ORDER — FENTANYL CITRATE PF 50 MCG/ML IJ SOSY
75.0000 ug | PREFILLED_SYRINGE | INTRAMUSCULAR | Status: DC | PRN
  Administered 2023-02-06 (×2): 75 ug via INTRAVENOUS
  Filled 2023-02-06 (×2): qty 2

## 2023-02-06 MED ORDER — PHENYLEPHRINE 80 MCG/ML (10ML) SYRINGE FOR IV PUSH (FOR BLOOD PRESSURE SUPPORT): 80.0000 ug | PREFILLED_SYRINGE | INTRAVENOUS | Status: DC | PRN

## 2023-02-06 MED ORDER — ORAL CARE MOUTH RINSE: 15.0000 mL | OROMUCOSAL | Status: DC | PRN

## 2023-02-06 MED ORDER — PROPOFOL 1000 MG/100ML IV EMUL
5.0000 ug/kg/min | INTRAVENOUS | Status: DC
  Administered 2023-02-06: 15 ug/kg/min via INTRAVENOUS
  Administered 2023-02-06: 30 ug/kg/min via INTRAVENOUS
  Filled 2023-02-06: qty 100

## 2023-02-06 MED ORDER — INSULIN ASPART 100 UNIT/ML IJ SOLN
0.0000 [IU] | INTRAMUSCULAR | Status: DC
  Administered 2023-02-06: 1 [IU] via SUBCUTANEOUS
  Administered 2023-02-07 (×2): 2 [IU] via SUBCUTANEOUS
  Administered 2023-02-07: 1 [IU] via SUBCUTANEOUS
  Administered 2023-02-07: 3 [IU] via SUBCUTANEOUS
  Administered 2023-02-08: 1 [IU] via SUBCUTANEOUS
  Administered 2023-02-08 (×2): 3 [IU] via SUBCUTANEOUS
  Administered 2023-02-08 (×2): 1 [IU] via SUBCUTANEOUS
  Administered 2023-02-09 (×3): 2 [IU] via SUBCUTANEOUS
  Administered 2023-02-09 – 2023-02-10 (×6): 1 [IU] via SUBCUTANEOUS

## 2023-02-06 MED ORDER — PROPOFOL 1000 MG/100ML IV EMUL
INTRAVENOUS | Status: AC
  Filled 2023-02-06: qty 100

## 2023-02-06 MED ORDER — POLYETHYLENE GLYCOL 3350 17 G PO PACK: 17.0000 g | PACK | Freq: Every day | ORAL | Status: DC | PRN

## 2023-02-06 MED ORDER — CHLORHEXIDINE GLUCONATE CLOTH 2 % EX PADS
6.0000 | MEDICATED_PAD | Freq: Every day | CUTANEOUS | Status: DC
  Administered 2023-02-06 – 2023-02-08 (×3): 6 via TOPICAL

## 2023-02-06 MED ORDER — NOREPINEPHRINE 4 MG/250ML-% IV SOLN
2.0000 ug/min | INTRAVENOUS | Status: DC
  Administered 2023-02-06: 6 ug/min via INTRAVENOUS

## 2023-02-06 MED ORDER — ARFORMOTEROL TARTRATE 15 MCG/2ML IN NEBU
15.0000 ug | INHALATION_SOLUTION | Freq: Two times a day (BID) | RESPIRATORY_TRACT | Status: DC
  Administered 2023-02-06 – 2023-02-07 (×3): 15 ug via RESPIRATORY_TRACT
  Filled 2023-02-06 (×3): qty 2

## 2023-02-06 MED ORDER — ORAL CARE MOUTH RINSE
15.0000 mL | OROMUCOSAL | Status: DC
  Administered 2023-02-06 (×5): 15 mL via OROMUCOSAL

## 2023-02-06 MED ORDER — POTASSIUM CHLORIDE 10 MEQ/100ML IV SOLN
10.0000 meq | INTRAVENOUS | Status: AC
  Administered 2023-02-06 (×5): 10 meq via INTRAVENOUS
  Filled 2023-02-06 (×5): qty 100

## 2023-02-06 MED ORDER — PHENYLEPHRINE 80 MCG/ML (10ML) SYRINGE FOR IV PUSH (FOR BLOOD PRESSURE SUPPORT)
PREFILLED_SYRINGE | INTRAVENOUS | Status: AC
  Administered 2023-02-06: 160 ug
  Filled 2023-02-06: qty 10

## 2023-02-06 MED ORDER — HYDROMORPHONE HCL 1 MG/ML IJ SOLN
0.5000 mg | INTRAMUSCULAR | Status: DC | PRN
  Administered 2023-02-06 (×2): 0.5 mg via INTRAVENOUS
  Filled 2023-02-06 (×2): qty 1

## 2023-02-06 MED ORDER — PROSOURCE TF20 ENFIT COMPATIBL EN LIQD: 60.0000 mL | Freq: Every day | ENTERAL | Status: DC

## 2023-02-06 MED ORDER — REVEFENACIN 175 MCG/3ML IN SOLN
175.0000 ug | Freq: Every day | RESPIRATORY_TRACT | Status: DC
  Administered 2023-02-06 – 2023-02-07 (×2): 175 ug via RESPIRATORY_TRACT
  Filled 2023-02-06 (×2): qty 3

## 2023-02-06 MED ORDER — VITAL HIGH PROTEIN PO LIQD: 1000.0000 mL | ORAL | Status: DC

## 2023-02-06 MED ORDER — SODIUM PHOSPHATES 45 MMOLE/15ML IV SOLN
30.0000 mmol | Freq: Once | INTRAVENOUS | Status: AC
  Administered 2023-02-06: 30 mmol via INTRAVENOUS
  Filled 2023-02-06: qty 10

## 2023-02-06 MED ORDER — DICLOFENAC SODIUM 1 % EX GEL
2.0000 g | Freq: Four times a day (QID) | CUTANEOUS | Status: DC
  Administered 2023-02-06 – 2023-02-18 (×43): 2 g via TOPICAL
  Filled 2023-02-06 (×2): qty 100

## 2023-02-06 MED ORDER — DOCUSATE SODIUM 50 MG/5ML PO LIQD: 100.0000 mg | Freq: Two times a day (BID) | ORAL | Status: DC | PRN

## 2023-02-06 MED ORDER — NOREPINEPHRINE 4 MG/250ML-% IV SOLN
INTRAVENOUS | Status: AC
  Administered 2023-02-06: 4 mg
  Filled 2023-02-06: qty 250

## 2023-02-06 MED ORDER — ROCURONIUM BROMIDE 50 MG/5ML IV SOLN
INTRAVENOUS | Status: AC | PRN
  Administered 2023-02-06: 100 mg via INTRAVENOUS

## 2023-02-06 MED ORDER — SODIUM CHLORIDE 0.9 % IV SOLN: 250.0000 mL | INTRAVENOUS | Status: DC

## 2023-02-06 MED ORDER — TETANUS-DIPHTH-ACELL PERTUSSIS 5-2.5-18.5 LF-MCG/0.5 IM SUSY: 0.5000 mL | PREFILLED_SYRINGE | Freq: Once | INTRAMUSCULAR | Status: AC

## 2023-02-06 MED ORDER — ENSURE ENLIVE PO LIQD
237.0000 mL | Freq: Two times a day (BID) | ORAL | Status: DC
  Administered 2023-02-07 – 2023-02-18 (×19): 237 mL via ORAL

## 2023-02-06 MED ORDER — ENOXAPARIN SODIUM 30 MG/0.3ML IJ SOSY
30.0000 mg | PREFILLED_SYRINGE | Freq: Two times a day (BID) | INTRAMUSCULAR | Status: DC
  Administered 2023-02-06 – 2023-02-09 (×6): 30 mg via SUBCUTANEOUS
  Filled 2023-02-06 (×6): qty 0.3

## 2023-02-06 MED ORDER — OLANZAPINE 2.5 MG PO TABS
2.5000 mg | ORAL_TABLET | Freq: Once | ORAL | Status: AC
  Administered 2023-02-06: 2.5 mg via ORAL
  Filled 2023-02-06: qty 1

## 2023-02-06 MED ORDER — HALOPERIDOL LACTATE 5 MG/ML IJ SOLN
2.0000 mg | Freq: Once | INTRAMUSCULAR | Status: AC
  Administered 2023-02-06: 2 mg via INTRAVENOUS
  Filled 2023-02-06: qty 1

## 2023-02-06 MED ORDER — LACTATED RINGERS IV SOLN: INTRAVENOUS | Status: DC

## 2023-02-06 MED ORDER — IOHEXOL 350 MG/ML SOLN
75.0000 mL | Freq: Once | INTRAVENOUS | Status: AC | PRN
  Administered 2023-02-06: 75 mL via INTRAVENOUS

## 2023-02-06 NOTE — Progress Notes (Signed)
? ?  Inpatient Rehab Admissions Coordinator : ? ?Per therapy recommendations, patient was screened for CIR candidacy by Barbara Boyette RN MSN.  At this time patient appears to be a potential candidate for CIR. I will place a rehab consult per protocol for full assessment. Please call me with any questions. ? ?Barbara Boyette RN MSN ?Admissions Coordinator ?[phone] ?  ?

## 2023-02-06 NOTE — Progress Notes (Signed)
eLink Physician-Brief Progress Note Patient Name: Michelle [name] DOB: 1932/08/09 MRN: [id number]   Date of Service  02/06/2023  HPI/Events of Note  89 year old initially presented to the emergency department with fall after suspected seizure with head trauma.  She had her head after falling off the toilet.  She is a history of seizure disorder and had an additional seizure earlier in the day lasting 2 minutes.  She was intubated in the emergency department due to reduced level of consciousness and CT was primarily obtained through the chart.  As baseline Eliquis for unclear reasons.  Upon arrival to the emergency department patient was hypertensive, but upon arrival to the unit, she became suddenly hypotensive requiring norepinephrine infusion.  She is being sedated with propofol and fentanyl pushes.  Family members at bedside.  Copious oral secretions on exam without evidence of seizure-like activity.  Laboratory studies with evidence of metabolic acidosis, mild hypokalemia with PVCs noted.    eICU Interventions  Trendelenburg Start norepinephrine Phenylephrine x1,  Increase fluids to 999cc/hr Propofol and as needed fentanyl ordered Bilateral wrist restraints as needed Famotidine for GI prophylaxis DVT prophylaxis is SCDs given DOAC use.     Intervention Category Major Interventions: Hypotension - evaluation and management Evaluation Type: New Patient Evaluation  Randel Hargens 02/06/2023, 6:05 AM

## 2023-02-06 NOTE — Evaluation (Signed)
Physical Therapy Evaluation Patient Details Name: Michelle [name] MRN: [id number] DOB: [birth year] Today's Date: 02/06/2023  History of Present Illness  89 y.o. female presents to [facility] hospital on 02/06/2023 after having a seizure, falling and striking her head. Pt intubated in the ED, extubated same date. PMH includes HTN, seizures, DMII, HLD, dementia, CAD, COPD.  Clinical Impression  Pt presents to PT with deficits in cognition, functional mobility, endurance, strength, power. Pt is confused and impulsive during session, often pulling at restraints and lines and requiring frequent redirection. Pt demonstrates BLE and trunk weakness, requiring support in sitting and standing. Initiation of gait training is limited by line management and pt safety concerns. PT recommends high intensity inpatient PT services at the time of discharge, pending further information regarding pt's recent baseline from family. PT attempted to call pt's son but was unable to reach.      Assistance Recommended at Discharge Frequent or constant Supervision/Assistance  If plan is discharge home, recommend the following:  Can travel by private vehicle  Two people to help with walking and/or transfers;Assistance with cooking/housework;Two people to help with bathing/dressing/bathroom;Direct supervision/assist for medications management;Direct supervision/assist for financial management;Assist for transportation;Help with stairs or ramp for entrance        Equipment Recommendations  (TBD pending history and post-acute setting)  Recommendations for Other Services  Rehab consult    Functional Status Assessment Patient has had a recent decline in their functional status and demonstrates the ability to make significant improvements in function in a reasonable and predictable amount of time.     Precautions / Restrictions Precautions Precautions: Fall Precaution Comments: seizure Restrictions Weight Bearing Restrictions:  No      Mobility  Bed Mobility Overal bed mobility: Needs Assistance Bed Mobility: Supine to Sit, Sit to Supine     Supine to sit: Mod assist Sit to supine: Total assist        Transfers Overall transfer level: Needs assistance Equipment used: 1 person hand held assist Transfers: Sit to/from Stand Sit to Stand: Mod assist           General transfer comment: pt stands twice with BUE support of PT    Ambulation/Gait             Pre-gait activities: maxA to take one side step to R side at edge of bed    Stairs            Wheelchair Mobility     Tilt Bed    Modified Rankin (Stroke Patients Only)       Balance Overall balance assessment: Needs assistance Sitting-balance support: No upper extremity supported, Feet unsupported Sitting balance-Leahy Scale: Poor Sitting balance - Comments: minG-minA Postural control: Posterior lean Standing balance support: Bilateral upper extremity supported Standing balance-Leahy Scale: Poor Standing balance comment: modA                             Pertinent Vitals/Pain Pain Assessment Pain Assessment: Faces Faces Pain Scale: Hurts even more Pain Location: LUE Pain Descriptors / Indicators: Grimacing Pain Intervention(s): Monitored during session    Home Living Family/patient expects to be discharged to:: Private residence Living Arrangements: Children Available Help at Discharge: Family;Available PRN/intermittently Type of Home: House Home Access: Stairs to enter Entrance Stairs-Rails: Right Entrance Stairs-Number of Steps: 4   Home Layout: One level Home Equipment: Rollator (4 wheels) Additional Comments: son present initially during session but PT unable to obtain history prior to  son's departure. Pt is unable to provide a reliable history due to AMS. History taken from admission in 2022, may be inaccurate at this time    Prior Function Prior Level of Function : Patient poor  historian/Family not available             Mobility Comments: notes indicate the pt fell when transferring to a bedside commode, pt unable to provide a reliable history at this time       Hand Dominance        Extremity/Trunk Assessment   Upper Extremity Assessment Upper Extremity Assessment: RUE deficits/detail;LUE deficits/detail RUE Deficits / Details: strength WFL, ROM WFL, unable to formally assess cognition due to impaired cognition LUE Deficits / Details: strength WFL, ROM WFL, unable to formally assess cognition due to impaired cognition    Lower Extremity Assessment Lower Extremity Assessment: Generalized weakness    Cervical / Trunk Assessment Cervical / Trunk Assessment: Kyphotic  Communication   Communication: Expressive difficulties  Cognition Arousal/Alertness: Awake/alert Behavior During Therapy: Impulsive Overall Cognitive Status: History of cognitive impairments - at baseline                                 General Comments: pt is alert and oriented to person, partially to place but not to time or situation. Pt is very impulsive and pulling at lines throughout session. Pt inconsistently follows one step commands. Poor memory during session        General Comments General comments (skin integrity, edema, etc.): VSS on RA, IV in L forearm lost during session, RN assists in dressing application.    Exercises     Assessment/Plan    PT Assessment Patient needs continued PT services  PT Problem List Decreased strength;Decreased activity tolerance;Decreased balance;Decreased mobility;Decreased coordination;Decreased cognition;Decreased safety awareness;Decreased knowledge of use of DME;Decreased knowledge of precautions       PT Treatment Interventions DME instruction;Gait training;Stair training;Functional mobility training;Therapeutic activities;Therapeutic exercise;Balance training;Neuromuscular re-education;Cognitive  remediation;Patient/family education    PT Goals (Current goals can be found in the Care Plan section)  Acute Rehab PT Goals Patient Stated Goal: to get out of bed PT Goal Formulation: With patient Time For Goal Achievement: 02/20/23 Potential to Achieve Goals: Good    Frequency Min 1X/week     Co-evaluation               AM-PAC PT "6 Clicks" Mobility  Outcome Measure Help needed turning from your back to your side while in a flat bed without using bedrails?: A Lot Help needed moving from lying on your back to sitting on the side of a flat bed without using bedrails?: A Lot Help needed moving to and from a bed to a chair (including a wheelchair)?: A Lot Help needed standing up from a chair using your arms (e.g., wheelchair or bedside chair)?: A Lot Help needed to walk in hospital room?: Total Help needed climbing 3-5 steps with a railing? : Total 6 Click Score: 10    End of Session   Activity Tolerance: Patient tolerated treatment well Patient left: in bed;with call bell/phone within reach;with bed alarm set;with nursing/sitter in room;with restraints reapplied Nurse Communication: Mobility status PT Visit Diagnosis: Other abnormalities of gait and mobility (R26.89);Muscle weakness (generalized) (M62.81);Other symptoms and signs involving the nervous system (R29.898)    Time: 9604-5409 PT Time Calculation (min) (ACUTE ONLY): 19 min   Charges:   PT Evaluation $PT Eval Low Complexity:  1 Low   PT General Charges $$ ACUTE PT VISIT: 1 Visit         Arlyss Gandy, PT, DPT Acute Rehabilitation Office [phone]   Arlyss Gandy 02/06/2023, 3:07 PM

## 2023-02-06 NOTE — H&P (Signed)
NAME:  Michelle [name], MRN:  [id number], DOB:  1933-01-17, LOS: 0 ADMISSION DATE:  02/06/2023, CONSULTATION DATE:  02/06/23 REFERRING MD:  EDP, CHIEF COMPLAINT:  seizure   History of Present Illness:  89 yo female presented as trauma after having a seizure while utilizing the bedside toilet and hit her head. Per report pt had a seizure earlier in day as well around 2 mins long. After the seizure that resulted in fall EMS was called and prior to her leaving her residence became combative and was given versed en route. When pt arrived to ED she  was intubated and after trauma scans negative and laceration on head stapled ccm was called for admit.    Pt is unable to provide any history 2/2 intubated status and all history is obtained from chart. Of note pt is on eliquis as outpt for undetermined reason. She takes lamictal for seizure d/o per family.     Pertinent  Medical History  Htn Dm2 Hyperlipidemia Dementia Cad Copd On doac 2/2 unknown reason  Significant Hospital Events: Including procedures, antibiotic start and stop dates in addition to other pertinent events   Presented as trauma, admitted 7/22 to ICU  Interim History / Subjective:    Objective   Blood pressure (!) 172/90, pulse 92, temperature (!) 97.5 F (36.4 C), resp. rate 20, weight 80 kg, SpO2 100%.    Vent Mode: PRVC FiO2 (%):  [100 %] 100 % Set Rate:  [20 bmp] 20 bmp Vt Set:  [480 mL] 480 mL PEEP:  [5 cmH20] 5 cmH20 Plateau Pressure:  [20 cmH20] 20 cmH20   Intake/Output Summary (Last 24 hours) at 02/06/2023 0405 Last data filed at 02/06/2023 0301 Gross per 24 hour  Intake 1000 ml  Output 300 ml  Net 700 ml   Filed Weights   02/06/23 0201  Weight: 80 kg    Examination: General: sedated and intubated HENT: with staples on posterior head laceration. Perrla, mmmp Lungs: ctab, no wheezes, rhonchi, rales Cardiovascular: rrr Abdomen: soft, nt, nd bs + Extremities: no c/c/e Neuro: sedated, spontaneous  movements without focal deficits GU: deferred  Resolved Hospital Problem list     Assessment & Plan:  Seizure d/o Seizure, poa Dementia Head laceration:  Acute encephalopathy 2/2 seizure -check lamictal level -eeg pending -neuro consult in am -uds pending -goals of care conversations with family ongoing -appreciate trauma surgeon  -cont wound care to head lac -hold eliquis  Acute hypoxic resp failure req mechanical ventilation:  -titrate vent with sat/sbt daily -cont usual protocols for vap prevention  Cad Htn Hyperlipidemia -home meds   T2dm , well controlled -ssi q4 -check a1c   Best Practice (right click and "Reselect all SmartList Selections" daily)   Diet/type: NPO DVT prophylaxis: SCD GI prophylaxis: H2B Lines: N/A Foley:  Yes, and it is still needed Code Status:  full code Last date of multidisciplinary goals of care discussion [ongoing with family]  Labs   CBC: Recent Labs  Lab 02/06/23 0141 02/06/23 0155  WBC 8.3  --   HGB 11.4* 11.6*  HCT 35.8* 34.0*  MCV 91.6  --   PLT 287  --     Basic Metabolic Panel: Recent Labs  Lab 02/06/23 0141 02/06/23 0155  NA 134* 138  K 3.3* 3.4*  CL 105 107  CO2 18*  --   GLUCOSE 184* 174*  BUN 31* 30*  CREATININE 1.06* 1.00  CALCIUM 9.0  --    GFR: Estimated Creatinine Clearance: 40.6 mL/min (  by C-G formula based on SCr of 1 mg/dL). Recent Labs  Lab 02/06/23 0141 02/06/23 0156  WBC 8.3  --   LATICACIDVEN  --  3.0*    Liver Function Tests: Recent Labs  Lab 02/06/23 0141  AST 21  ALT 16  ALKPHOS 36*  BILITOT 0.5  PROT 6.7  ALBUMIN 3.3*   No results for input(s): "LIPASE", "AMYLASE" in the last 168 hours. Recent Labs  Lab 02/06/23 0314  AMMONIA <10    ABG    Component Value Date/Time   TCO2 16 (L) 02/06/2023 0155     Coagulation Profile: Recent Labs  Lab 02/06/23 0141  INR 1.2    Cardiac Enzymes: No results for input(s): "CKTOTAL", "CKMB", "CKMBINDEX", "TROPONINI" in  the last 168 hours.  HbA1C: Hgb A1c MFr Bld  Date/Time Value Ref Range Status  12/19/2022 02:40 PM 5.5 4.6 - 6.5 % Final    Comment:    Glycemic Control Guidelines for People with Diabetes:Non Diabetic:  <6%Goal of Therapy: <7%Additional Action Suggested:  >8%   08/19/2022 09:12 AM 6.0 4.6 - 6.5 % Final    Comment:    Glycemic Control Guidelines for People with Diabetes:Non Diabetic:  <6%Goal of Therapy: <7%Additional Action Suggested:  >8%     CBG: No results for input(s): "GLUCAP" in the last 168 hours.  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   Surgical History:   Past Surgical History:  Procedure Laterality Date   CATARACT EXTRACTION  2020   right and left eye    CHOLECYSTECTOMY     thinks her appendix was removed at the same time   CORONARY ANGIOPLASTY WITH STENT PLACEMENT  02/18/14   Promus DES to RCA   LEFT HEART CATHETERIZATION WITH CORONARY ANGIOGRAM N/A 02/18/2014   Procedure: LEFT HEART CATHETERIZATION WITH CORONARY ANGIOGRAM;  Surgeon: Corky Crafts, MD;  Location: [facility] CATH LAB;  Service: Cardiovascular;  Laterality: N/A;   PTCA  1990   PTCA of RCA   TRANSTHORACIC ECHOCARDIOGRAM  02/02/2012   mild LVH, EF 55-60%, Normal WM, Gr 1 DD; Mild MR     Social History:   reports that she quit smoking about 16 years ago. Her smoking use included cigarettes. She started smoking about 71 years ago. She has a 55 pack-year smoking  history. She has never used smokeless tobacco. She reports that she does not drink alcohol and does not use drugs.   Family History:  Her family history includes Bone cancer in her brother; Cancer in her maternal grandfather, paternal grandfather, and paternal grandmother; Cancer (age of onset: 73) in her father; Diabetes in her son and son; Heart attack in her son; Heart attack (age of onset: 52) in her mother; Hyperlipidemia in her son; Hypertension in her son; Liver cancer in her brother. There is no history of Colon cancer, Ovarian cancer, or Uterine cancer.   Allergies Allergies  Allergen Reactions   Codeine Sulfate Nausea Only   Morphine Sulfate Nausea Only     Home Medications  Prior to Admission medications  Medication Sig Start Date End Date Taking? Authorizing Provider  acetaminophen (TYLENOL) 650 MG CR tablet Take 1,300 mg by mouth 3 (three) times daily. Patient take 3 times a day.    [provider]  albuterol (PROVENTIL) (2.5 MG/3ML) 0.083% nebulizer solution USE THREE MILLILITERS VIA NEBULIZATION BY MOUTH EVERY 6 HOURS AS NEEDED FOR WHEEZING OR SHORTNESS OF BREATH 10/12/21   Shelva Majestic, MD  albuterol (VENTOLIN HFA) 108 (90 Base) MCG/ACT inhaler INHALE 1 PUFF EVERY 4 HOURS AS NEEDED FOR WHEEZING, SHORTNESS OF BREATH (RESCUE INHALER IF ADVAIR NOT WORKING) 10/13/22   Shelva Majestic, MD  Alcohol Swabs (DROPSAFE ALCOHOL PREP) 70 % PADS USE FOR TESTING THREE TIMES DAILY 11/07/22   Shelva Majestic, MD  amLODipine (NORVASC) 2.5 MG tablet Take 2.5 mg in the morning. If afternoon blood pressure >145/90 take second tablet of 2.5 06/29/22   Shelva Majestic, MD  aspirin 81 MG tablet Take 81 mg by mouth daily with breakfast. 07/02/14   [provider]  Blood Glucose Calibration (ACCU-CHEK SMARTVIEW CONTROL) LIQD  01/11/17   [provider]  blood glucose meter kit and supplies KIT Dispense based on patient and insurance preference. Use up to four times  daily as directed. Dx:E11.9 04/01/21   Shelva Majestic, MD  calcium carbonate (OSCAL) 1500 (600 Ca) MG TABS tablet Take 600 mg of elemental calcium by mouth daily with breakfast.    [provider]  carvedilol (COREG) 12.5 MG tablet Take 1 tablet (12.5 mg total) by mouth 2 (two) times daily. 12/06/22   Alver Sorrow, NP  cholecalciferol (VITAMIN D3) 25 MCG (1000 UNIT) tablet Take 1,000 Units by mouth daily with breakfast.    [provider]  clopidogrel (PLAVIX) 75 MG tablet TAKE 1 TABLET EVERY DAY 11/23/22   Shelva Majestic, MD  diclofenac Sodium (VOLTAREN) 1 % GEL APPLY 2 GRAMS TOPICALLY TO AFFECTED AREA FOUR TIMES A DAY 08/08/22   Shelva Majestic, MD  enalapril (VASOTEC) 20 MG tablet TAKE 1 TABLET TWICE DAILY 11/07/22   Shelva Majestic, MD  fenofibrate micronized (LOFIBRA) 134 MG capsule TAKE 1 CAPSULE EVERY DAY 12/27/22   Shelva Majestic, MD  ferrous sulfate 325 (65 FE) MG tablet Twice a week Patient taking differently: Take 325 mg by mouth 2 (two) times a week. 02/21/20   Shelva Majestic, MD  fluticasone-salmeterol (ADVAIR) 250-50 MCG/ACT AEPB INHALE 1 PUFF TWICE DAILY 09/12/22   Shelva Majestic, MD  folic acid (FOLVITE) 400 MCG tablet Take 400 mcg by mouth daily with breakfast.    [provider]  furosemide (LASIX) 20 MG tablet TAKE 1 TABLET EVERY DAY 05/02/22   Alver Sorrow, NP  hydrALAZINE (APRESOLINE) 50 MG tablet Take 1 tablet (50 mg total) by mouth 3 (three) times daily. 09/06/22   Chilton Si, MD  icosapent Ethyl (VASCEPA) 1 g capsule Take 2 capsules (2 g total) by mouth 2 (two) times daily. 12/06/22   Alver Sorrow, NP  insulin glargine (LANTUS SOLOSTAR) 100 UNIT/ML Solostar Pen INJECT 17 TO 20 UNITS UNDER THE SKIN EVERY MORNING 12/08/22   Shelva Majestic, MD  Insulin Pen Needle (DROPLET PEN NEEDLES) 31G X 8 MM MISC USE AS DIRECTED WITH LANTUS SOLOSTAR PEN. Dx. E11.9 01/20/23   Shelva Majestic, MD  lamoTRIgine (LAMICTAL) 25 MG tablet  TAKE 1 TABLET TWICE DAILY 12/21/22   Van Clines, MD  Lancets Misc. (ACCU-CHEK FASTCLIX LANCET) KIT 1 Device by Does not  apply route. Use as directed for testing blood sugar Patient not taking: Reported on 01/20/2023    [provider]  Lancets Misc. (ACCU-CHEK FASTCLIX LANCET) KIT Use as directed to test blood sugar Patient not taking: Reported on 01/20/2023 11/13/20   Shelva Majestic, MD  loratadine (CLARITIN) 10 MG tablet Take 10 mg by mouth every other day.    [provider]  medroxyPROGESTERone (PROVERA) 10 MG tablet Take 1 tablet (10 mg total) by mouth daily. 09/12/22   Warner Mccreedy D, NP  Melatonin 5 MG CAPS Take 5 mg by mouth at bedtime.    [provider]  metFORMIN (GLUCOPHAGE-XR) 500 MG 24 hr tablet Take 1 tablet (500 mg total) by mouth daily with breakfast. 12/20/22   Shelva Majestic, MD  nitroGLYCERIN (NITROSTAT) 0.4 MG SL tablet DISSOLVE 1 TAB UNDER TONGUE FOR CHEST PAIN - IF PAIN REMAINS AFTER 5 MIN, CALL 911 AND REPEAT DOSE. MAX 3 TABS IN 15 MINUTES 01/17/23   Chilton Si, MD  nortriptyline (PAMELOR) 10 MG capsule TAKE 1 CAPSULE BY MOUTH AT BEDTIME AS NEEDED FOR PAIN PREVENTION 01/30/23   Rodolph Bong, MD  pantoprazole (PROTONIX) 40 MG tablet TAKE 1 TABLET EVERY DAY 07/13/22   Shelva Majestic, MD  psyllium (METAMUCIL) 58.6 % packet Take 1 packet by mouth daily.    [provider]  rosuvastatin (CRESTOR) 40 MG tablet TAKE 1 TABLET EVERY DAY 08/08/22   Shelva Majestic, MD  sertraline (ZOLOFT) 50 MG tablet TAKE 2 TABLETS AT BEDTIME 10/13/22   Shelva Majestic, MD  SPIRIVA HANDIHALER 18 MCG inhalation capsule PLACE 1 CAPSULE INTO HANDIHALER AND INHALE THE CONTENTS DAILY 11/23/22   Shelva Majestic, MD  spironolactone (ALDACTONE) 25 MG tablet TAKE 1 TABLET EVERY DAY 08/22/22   Shelva Majestic, MD  TRUE METRIX BLOOD GLUCOSE TEST test strip TEST BLOOD SUGAR UP TO FOUR TIMES DAILY AS DIRECTED 06/29/22   Shelva Majestic, MD  TRUEplus Lancets 33G  MISC TEST BLOOD SUGAR UP TO FOUR TIMES DAILY AS DIRECTED 06/29/22   Shelva Majestic, MD  vitamin B-12 (CYANOCOBALAMIN) 1000 MCG tablet Take 1,000 mcg by mouth daily with breakfast.    [provider]  vitamin E 400 UNIT capsule Take 400 Units by mouth at bedtime.    [provider]     Critical care time: 

## 2023-02-06 NOTE — ED Notes (Addendum)
Trauma Response Nurse Documentation   Michelle [name] is a 89 y.o. female arriving to [facility] ED via EMS  On clopidogrel 75 mg daily. Trauma was activated as a Level 2 by ED charge RN based on the following trauma criteria Elderly patients > 65 with head trauma on anti-coagulation (excluding ASA). Upgraded to level 1 trauma upon arrival for mental status GCS 6 (E1V1M4). Patient cleared for CT by Dr. Cliffton [name] Trauma MD. Pt transported to CT with trauma response nurse present to monitor. RN remained with the patient throughout their absence from the department for clinical observation.   GCS 6(E1V1M4). .  History   Past Medical History:  Diagnosis Date   Anginal pain (HCC)    Arthritis    AV block, 1st degree    CAD (coronary artery disease) 1990; 2015   Cardiac cath 1990 with Dr. Riley [name] and [name] Residual CAD, non obstructive per 2015 cath. STEMI status post stent in August 2015.   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 02/18/14   Promus DES to RCA with STEMI   Shortness of breath      Past Surgical History:  Procedure Laterality Date   CATARACT EXTRACTION  2020   right and left eye    CHOLECYSTECTOMY     thinks her appendix was removed at the same time   CORONARY ANGIOPLASTY WITH STENT PLACEMENT  02/18/14   Promus DES to RCA   LEFT HEART CATHETERIZATION WITH CORONARY ANGIOGRAM N/A 02/18/2014   Procedure: LEFT HEART CATHETERIZATION WITH CORONARY ANGIOGRAM;  Surgeon: Michelle Crafts, MD;  Location: [facility] CATH LAB;  Service:  Cardiovascular;  Laterality: N/A;   PTCA  1990   PTCA of RCA   TRANSTHORACIC ECHOCARDIOGRAM  02/02/2012   mild LVH, EF 55-60%, Normal WM, Gr 1 DD; Mild MR       Initial Focused Assessment (If applicable, or please see trauma documentation): Unresponsive female presents via EMS after a fall with head injury, reported seizure activity.    Airway patent but unprotected d/t GCS, intubated, BS clear Bleeding to head lac, staples placed during secondary assessment GCS 6 on arrival, PERRLA 4mm  CT's Completed:   CT Head, CT C-Spine, CT Chest w/ contrast, and CT abdomen/pelvis w/ contrast   Interventions:  IV start x2 and trauma lab draw RSI with etomidate and rocuronium NG 16 Fr Foley 16Fr [place] J collar Portable chest and pelvis, left femur and left knee XRAY CT head, cervical, chest, abdomen, pelvis TDAP Potassium run x1 CAGE AID  Plan for disposition:  Admission to ICU   Consults completed:  CCM 0329  Event Summary: Presents via EMS after a fall with head lac to the top of her head. Seizure like activity reported PTA. Unresponsive and combative on arrival, GCS 6. Upgraded to a level 1 trauma and intubated on arrival. [place] J replaced EMS collar following intubation. Bleeding to head lac, three staples placed with improvement. NG placed in left nare. Portable XRAY chest and pelvis completed, then escorted to CT.  Foley placed upon return to treatment room. Son Michelle [name] at bedside, updated. Extremity XRAYs completed, CCM consulted for admission. Scans without acute traumatic injury.    Bedside handoff with ED RN Michelle [name].    Michelle [name]  Trauma Response RN  Please call TRN at [phone] for further assistance.

## 2023-02-06 NOTE — ED Notes (Signed)
Back from CT

## 2023-02-06 NOTE — Progress Notes (Addendum)
NAME:  Michelle [name], MRN:  [id number], DOB:  08-Feb-1933, LOS: 0 ADMISSION DATE:  02/06/2023, CONSULTATION DATE:  02/06/23 REFERRING MD:  EDP, CHIEF COMPLAINT:  seizure   History of Present Illness:  89 yo female former smoker with a 55 pack year smoking history presented as trauma after having a seizure while utilizing the bedside toilet and hit her head. Per report pt had a seizure earlier in day as well around 2 mins long. After the seizure that resulted in fall EMS was called and prior to her leaving her residence became combative and was given versed en route. When pt arrived to ED she  was intubated and after trauma scans negative and laceration on head stapled ccm was called for admit.    Pt is unable to provide any history 2/2 intubated status and all history is obtained from chart. Of note pt is on asa and plavix  as outpt .She takes lamictal for seizure d/o per family.   Of note , she has an ovarian cyst concerning for neoplasm, and pulmonary nodules measuring up to 2 cm. Family are aware of both Followed as OP by Dr. Karel Jarvis Neuro    Pertinent  Medical History  Htn Dm2 Hyperlipidemia Dementia Cad Copd On doac 2/2 unknown reason  Significant Hospital Events: Including procedures, antibiotic start and stop dates in addition to other pertinent events   Presented as trauma, admitted 7/22 to ICU, intubated  Interim History / Subjective:  Hypotensive overnight Levophed started and fluid bolus Remains intermittently agitated PVC's per tele AM Labs pending  Weaning well on 40%, 5/5  T Max 99.1 Previous labs Reviewed  Na 138/ K 3.4/ Cl 107/BUN 30/Creatinine 1/ Calcium 1.2/ INR 1.2 ETOH < 10 Labs to be drawn at 10 am Drug screen not yet collected, collected by Day RN Phos, Mag ,BMET, CBC pending Lamotrigine level not drawn( Send out to [facility] takes 2-5 days) Lab has been called to collect  Objective   Blood pressure (!) 147/103, pulse 93, temperature 98.1 F (36.7  C), resp. rate 20, weight 80 kg, SpO2 99%.    Vent Mode: PSV;CPAP FiO2 (%):  [40 %-100 %] 40 % Set Rate:  [20 bmp-22 bmp] 22 bmp Vt Set:  [450 mL-480 mL] 450 mL PEEP:  [5 cmH20] 5 cmH20 Pressure Support:  [5 cmH20] 5 cmH20 Plateau Pressure:  [5 cmH20-20 cmH20] 5 cmH20   Intake/Output Summary (Last 24 hours) at 02/06/2023 0818 Last data filed at 02/06/2023 0800 Gross per 24 hour  Intake 1802.04 ml  Output 300 ml  Net 1502.04 ml   Filed Weights   02/06/23 0201  Weight: 80 kg    Examination: General: sedated and intubated, calm at present as having EEG  HENT: with staples on posterior head laceration. Perrla, mmmp Lungs: Bilateral chest excursion, Clear threoughout, diminished per bases,  no wheezes, rhonchi, rales Cardiovascular: rrr, PVC's per tele Abdomen: soft, nt, nd, bs + Extremities: no obvious deformities,brisk cap refill Neuro: sedated, spontaneous movements without focal deficits GU: deferred  Resolved Hospital Problem list     Assessment & Plan:  Seizure d/o Seizure, poa Dementia Head laceration:  Acute encephalopathy 2/2 seizure Home Lamotrigine dose 25 mg daily, compliant Plan -check lamictal level>> to be drawn at 10 am , send out ( 2-5 day turn around) -eeg done and shows moderate to severe encephalopathy, no seizures -neuro consulted 7/22 am -uds collected 7/22  am  -goals of care conversations with family ongoing -appreciate trauma surgeon  -  cont wound care to head lac -hold eliquis  Acute hypoxic resp failure req mechanical ventilation:  COPD Former smoker quit 2008 with a 55 pack year smoking history Lung nodule measure up to 2 cm Plan  Continue ventilator support with lung protective strategies  Wean PEEP and FiO2 for sats greater than 90%. Head of bed elevated 30 degrees. Plateau pressures less than 30 cm H20.  Follow intermittent chest x-ray and ABG.   SAT/SBT as tolerated, mentation precludes extubation  Ensure adequate pulmonary  hygiene  Follow cultures  VAP bundle in place  PAD protocol  BD as ordered ABG now Weaning on 40%, 5/5, consider extubation if mental status allows Will need 3 month follow up Ct chest as OP to follow lung nodule  Metabolic Acidosis Plan Trend Lactate, first draw now MAP  goal > 65  Hypotension Very Labile BP overnight 2/2 agitation Hx. CAD Hx. Htn Hx. Hyperlipidemia Plan - Titrate levophed for MAP > 65 - Hold home antihypertensives for now    Multi-septated cystic ovarian neoplasm  Plan - Family are aware - Gyn recommended a hysterectomy - Will need follow up of GOC planning  T2dm , well controlled -ssi q4 -check a1c   Best Practice (right click and "Reselect all SmartList Selections" daily)   Diet/type: NPO DVT prophylaxis: SCD GI prophylaxis: H2B Lines: N/A Foley:  Yes, and it is still needed Code Status:  full code Last date of multidisciplinary goals of care discussion [ongoing with family]  Labs   CBC: Recent Labs  Lab 02/06/23 0141 02/06/23 0155 02/06/23 0420  WBC 8.3  --   --   HGB 11.4* 11.6* 10.9*  HCT 35.8* 34.0* 32.0*  MCV 91.6  --   --   PLT 287  --   --     Basic Metabolic Panel: Recent Labs  Lab 02/06/23 0141 02/06/23 0155 02/06/23 0420  NA 134* 138 138  K 3.3* 3.4* 3.5  CL 105 107  --   CO2 18*  --   --   GLUCOSE 184* 174*  --   BUN 31* 30*  --   CREATININE 1.06* 1.00  --   CALCIUM 9.0  --   --    GFR: Estimated Creatinine Clearance: 40.6 mL/min (by C-G formula based on SCr of 1 mg/dL). Recent Labs  Lab 02/06/23 0141 02/06/23 0156  WBC 8.3  --   LATICACIDVEN  --  3.0*    Liver Function Tests: Recent Labs  Lab 02/06/23 0141  AST 21  ALT 16  ALKPHOS 36*  BILITOT 0.5  PROT 6.7  ALBUMIN 3.3*   No results for input(s): "LIPASE", "AMYLASE" in the last 168 hours. Recent Labs  Lab 02/06/23 0314  AMMONIA <10    ABG    Component Value Date/Time   PHART 7.280 (L) 02/06/2023 0420   PCO2ART 38.8 02/06/2023  0420   PO2ART 360 (H) 02/06/2023 0420   HCO3 18.4 (L) 02/06/2023 0420   TCO2 20 (L) 02/06/2023 0420   ACIDBASEDEF 8.0 (H) 02/06/2023 0420   O2SAT 100 02/06/2023 0420     Coagulation Profile: Recent Labs  Lab 02/06/23 0141  INR 1.2    Cardiac Enzymes: No results for input(s): "CKTOTAL", "CKMB", "CKMBINDEX", "TROPONINI" in the last 168 hours.  HbA1C: Hgb A1c MFr Bld  Date/Time Value Ref Range Status  12/19/2022 02:40 PM 5.5 4.6 - 6.5 % Final    Comment:    Glycemic Control Guidelines for People with Diabetes:Non Diabetic:  <6%Goal of Therapy: <  7%Additional Action Suggested:  >8%   08/19/2022 09:12 AM 6.0 4.6 - 6.5 % Final    Comment:    Glycemic Control Guidelines for People with Diabetes:Non Diabetic:  <6%Goal of Therapy: <7%Additional Action Suggested:  >8%     CBG: Recent Labs  Lab 02/06/23 0522  GLUCAP 148*   Allergies Allergies  Allergen Reactions   Codeine Sulfate Nausea Only   Morphine Sulfate Nausea Only     Home Medications  Prior to Admission medications   Medication Sig Start Date End Date Taking? Authorizing Provider  acetaminophen (TYLENOL) 650 MG CR tablet Take 1,300 mg by mouth 3 (three) times daily. Patient take 3 times a day.    [provider]  albuterol (PROVENTIL) (2.5 MG/3ML) 0.083% nebulizer solution USE THREE MILLILITERS VIA NEBULIZATION BY MOUTH EVERY 6 HOURS AS NEEDED FOR WHEEZING OR SHORTNESS OF BREATH 10/12/21   Shelva Majestic, MD  albuterol (VENTOLIN HFA) 108 (90 Base) MCG/ACT inhaler INHALE 1 PUFF EVERY 4 HOURS AS NEEDED FOR WHEEZING, SHORTNESS OF BREATH (RESCUE INHALER IF ADVAIR NOT WORKING) 10/13/22   Shelva Majestic, MD  Alcohol Swabs (DROPSAFE ALCOHOL PREP) 70 % PADS USE FOR TESTING THREE TIMES DAILY 11/07/22   Shelva Majestic, MD  amLODipine (NORVASC) 2.5 MG tablet Take 2.5 mg in the morning. If afternoon blood pressure >145/90 take second tablet of 2.5 06/29/22   Shelva Majestic, MD  aspirin 81 MG tablet Take 81 mg by  mouth daily with breakfast. 07/02/14   [provider]  Blood Glucose Calibration (ACCU-CHEK SMARTVIEW CONTROL) LIQD  01/11/17   [provider]  blood glucose meter kit and supplies KIT Dispense based on patient and insurance preference. Use up to four times daily as directed. Dx:E11.9 04/01/21   Shelva Majestic, MD  calcium carbonate (OSCAL) 1500 (600 Ca) MG TABS tablet Take 600 mg of elemental calcium by mouth daily with breakfast.    [provider]  carvedilol (COREG) 12.5 MG tablet Take 1 tablet (12.5 mg total) by mouth 2 (two) times daily. 12/06/22   Alver Sorrow, NP  cholecalciferol (VITAMIN D3) 25 MCG (1000 UNIT) tablet Take 1,000 Units by mouth daily with breakfast.    [provider]  clopidogrel (PLAVIX) 75 MG tablet TAKE 1 TABLET EVERY DAY 11/23/22   Shelva Majestic, MD  diclofenac Sodium (VOLTAREN) 1 % GEL APPLY 2 GRAMS TOPICALLY TO AFFECTED AREA FOUR TIMES A DAY 08/08/22   Shelva Majestic, MD  enalapril (VASOTEC) 20 MG tablet TAKE 1 TABLET TWICE DAILY 11/07/22   Shelva Majestic, MD  fenofibrate micronized (LOFIBRA) 134 MG capsule TAKE 1 CAPSULE EVERY DAY 12/27/22   Shelva Majestic, MD  ferrous sulfate 325 (65 FE) MG tablet Twice a week Patient taking differently: Take 325 mg by mouth 2 (two) times a week. 02/21/20   Shelva Majestic, MD  fluticasone-salmeterol (ADVAIR) 250-50 MCG/ACT AEPB INHALE 1 PUFF TWICE DAILY 09/12/22   Shelva Majestic, MD  folic acid (FOLVITE) 400 MCG tablet Take 400 mcg by mouth daily with breakfast.    [provider]  furosemide (LASIX) 20 MG tablet TAKE 1 TABLET EVERY DAY 05/02/22   Alver Sorrow, NP  hydrALAZINE (APRESOLINE) 50 MG tablet Take 1 tablet (50 mg total) by mouth 3 (three) times daily. 09/06/22   Chilton Si, MD  icosapent Ethyl (VASCEPA) 1 g capsule Take 2 capsules (2 g total) by mouth 2 (two) times daily. 12/06/22   Alver Sorrow,  NP  insulin glargine (LANTUS SOLOSTAR) 100 UNIT/ML  Solostar Pen INJECT 17 TO 20 UNITS UNDER THE SKIN EVERY MORNING 12/08/22   Shelva Majestic, MD  Insulin Pen Needle (DROPLET PEN NEEDLES) 31G X 8 MM MISC USE AS DIRECTED WITH LANTUS SOLOSTAR PEN. Dx. E11.9 01/20/23   Shelva Majestic, MD  lamoTRIgine (LAMICTAL) 25 MG tablet TAKE 1 TABLET TWICE DAILY 12/21/22   Van Clines, MD  Lancets Misc. (ACCU-CHEK FASTCLIX LANCET) KIT 1 Device by Does not apply route. Use as directed for testing blood sugar Patient not taking: Reported on 01/20/2023    [provider]  Lancets Misc. (ACCU-CHEK FASTCLIX LANCET) KIT Use as directed to test blood sugar Patient not taking: Reported on 01/20/2023 11/13/20   Shelva Majestic, MD  loratadine (CLARITIN) 10 MG tablet Take 10 mg by mouth every other day.    [provider]  medroxyPROGESTERone (PROVERA) 10 MG tablet Take 1 tablet (10 mg total) by mouth daily. 09/12/22   Warner Mccreedy D, NP  Melatonin 5 MG CAPS Take 5 mg by mouth at bedtime.    [provider]  metFORMIN (GLUCOPHAGE-XR) 500 MG 24 hr tablet Take 1 tablet (500 mg total) by mouth daily with breakfast. 12/20/22   Shelva Majestic, MD  nitroGLYCERIN (NITROSTAT) 0.4 MG SL tablet DISSOLVE 1 TAB UNDER TONGUE FOR CHEST PAIN - IF PAIN REMAINS AFTER 5 MIN, CALL 911 AND REPEAT DOSE. MAX 3 TABS IN 15 MINUTES 01/17/23   Chilton Si, MD  nortriptyline (PAMELOR) 10 MG capsule TAKE 1 CAPSULE BY MOUTH AT BEDTIME AS NEEDED FOR PAIN PREVENTION 01/30/23   Rodolph Bong, MD  pantoprazole (PROTONIX) 40 MG tablet TAKE 1 TABLET EVERY DAY 07/13/22   Shelva Majestic, MD  psyllium (METAMUCIL) 58.6 % packet Take 1 packet by mouth daily.    [provider]  rosuvastatin (CRESTOR) 40 MG tablet TAKE 1 TABLET EVERY DAY 08/08/22   Shelva Majestic, MD  sertraline (ZOLOFT) 50 MG tablet TAKE 2 TABLETS AT BEDTIME 10/13/22   Shelva Majestic, MD  SPIRIVA HANDIHALER 18 MCG inhalation capsule PLACE 1 CAPSULE INTO HANDIHALER AND INHALE THE CONTENTS DAILY  11/23/22   Shelva Majestic, MD  spironolactone (ALDACTONE) 25 MG tablet TAKE 1 TABLET EVERY DAY 08/22/22   Shelva Majestic, MD  TRUE METRIX BLOOD GLUCOSE TEST test strip TEST BLOOD SUGAR UP TO FOUR TIMES DAILY AS DIRECTED 06/29/22   Shelva Majestic, MD  TRUEplus Lancets 33G MISC TEST BLOOD SUGAR UP TO FOUR TIMES DAILY AS DIRECTED 06/29/22   Shelva Majestic, MD  vitamin B-12 (CYANOCOBALAMIN) 1000 MCG tablet Take 1,000 mcg by mouth daily with breakfast.    [provider]  vitamin E 400 UNIT capsule Take 400 Units by mouth at bedtime.    [provider]     Critical care time:    Bevelyn Ngo, MSN, AGACNP-BC [facility] Pulmonary/Critical Care Medicine See Amion for personal pager PCCM on call pager [phone]  02/06/2023 9:46 AM

## 2023-02-06 NOTE — Consult Note (Signed)
Activation and Reason: Level 1, fall during seizure, head laceration, on blood thinner  Primary Survey:  Airway: secured on arrival by EDP Breathing: bilateral breath sounds Circulation: palpable pulses in all 4 ext Disability: GCS 7 ([id number])  HPI: Michelle [name] is an 89 y.o. female with hx of HTN, DM, HLD, Dementia, CAD, COPD, on Eliquis, ?Seizure disorder presetned to [facility] ED by EMS and by report she appeared to be having a seizure by family and fell from her bedside commode and hit her head on the ground. En route, not following commands and somewhat combative. Arrived GCS 7 and combative and intubated by EDP on arrival.   No family currently present  Past Medical History:  Diagnosis Date   Anginal pain (HCC)    Arthritis    AV block, 1st degree    CAD (coronary artery disease) 1990; 2015   Cardiac cath 1990 with Dr. Riley Kill and [name] Residual CAD, non obstructive per 2015 cath. STEMI status post stent in August 2015.   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   Promus DES to RCA with STEMI   Shortness of breath     Past Surgical History:  Procedure Laterality Date   CATARACT EXTRACTION  2020   right and left eye    CHOLECYSTECTOMY     thinks her appendix was removed at the same time   CORONARY ANGIOPLASTY WITH STENT PLACEMENT  02/18/14   Promus DES to RCA   LEFT HEART CATHETERIZATION WITH CORONARY ANGIOGRAM N/A 02/18/2014   Procedure: LEFT HEART CATHETERIZATION WITH CORONARY ANGIOGRAM;  Surgeon: Corky Crafts, MD;  Location: [facility] CATH LAB;  Service: Cardiovascular;  Laterality: N/A;   PTCA  1990   PTCA of RCA   TRANSTHORACIC ECHOCARDIOGRAM  02/02/2012   mild LVH, EF 55-60%, Normal WM, Gr 1 DD; Mild MR    Family History  Problem Relation Age of Onset   Heart attack Mother 65   Cancer Father 42   Cancer Maternal Grandfather    Cancer Paternal Grandmother    Cancer Paternal Grandfather    Diabetes Son    Liver cancer Brother    Bone cancer Brother    Heart attack Son    Hypertension Son    Diabetes Son    Hyperlipidemia Son    Colon cancer Neg Hx    Ovarian cancer Neg Hx    Uterine cancer Neg Hx     Social:  reports that she quit smoking about 16 years ago. Her smoking use included cigarettes. She started smoking about 71 years ago. She has a 55 pack-year smoking history. She has never used smokeless tobacco. She reports that she does not drink alcohol and does not use drugs.  Allergies:  Allergies  Allergen Reactions   Codeine Sulfate Nausea Only   Morphine Sulfate Nausea Only    Medications: I have reviewed the patient's current medications.  Results for orders placed or performed during the hospital encounter of 02/06/23 (from the past 48 hour(s))  Comprehensive  metabolic panel     Status: Abnormal   Collection Time: 02/06/23  1:41 AM  Result Value Ref Range   Sodium 134 (L) 135 - 145 mmol/L   Potassium 3.3 (L) 3.5 - 5.1 mmol/L   Chloride 105 98 - 111 mmol/L   CO2 18 (L) 22 - 32 mmol/L   Glucose, Bld 184 (H) 70 - 99 mg/dL    Comment: Glucose reference range applies only to samples taken after fasting for at least 8 hours.   BUN 31 (H) 8 - 23 mg/dL   Creatinine, Ser 0.98 (H) 0.44 - 1.00 mg/dL   Calcium 9.0 8.9 - 11.9 mg/dL   Total Protein 6.7 6.5 - 8.1 g/dL   Albumin 3.3 (L) 3.5 - 5.0 g/dL   AST 21 15 - 41 U/L   ALT 16 0 - 44 U/L   Alkaline Phosphatase 36 (L) 38 - 126 U/L   Total Bilirubin 0.5 0.3 - 1.2 mg/dL   GFR, Estimated 51 (L) >60 mL/min    Comment:  (NOTE) Calculated using the CKD-EPI Creatinine Equation (2021)    Anion gap 11 5 - 15    Comment: Performed at [facility], [address] [place], [place] [address]  CBC     Status: Abnormal   Collection Time: 02/06/23  1:41 AM  Result Value Ref Range   WBC 8.3 4.0 - 10.5 K/uL   RBC 3.91 3.87 - 5.11 MIL/uL   Hemoglobin 11.4 (L) 12.0 - 15.0 g/dL   HCT 95.6 (L) 21.3 - 08.6 %   MCV 91.6 80.0 - 100.0 fL   MCH 29.2 26.0 - 34.0 pg   MCHC 31.8 30.0 - 36.0 g/dL   RDW 57.8 46.9 - 62.9 %   Platelets 287 150 - 400 K/uL   nRBC 0.0 0.0 - 0.2 %    Comment: Performed at [facility], [address], [place], [place] [address]  Ethanol     Status: None   Collection Time: 02/06/23  1:41 AM  Result Value Ref Range   Alcohol, Ethyl (B) <10 <10 mg/dL    Comment: (NOTE) Lowest detectable limit for serum alcohol is 10 mg/dL.  For medical purposes only. Performed at [facility], [address], [place], [place] [address]   Protime-INR     Status: Abnormal   Collection Time: 02/06/23  1:41 AM  Result Value Ref Range   Prothrombin Time 15.7 (H) 11.4 - 15.2 seconds   INR 1.2 0.8 - 1.2    Comment: (NOTE) INR goal varies based on device and disease states. Performed at [facility], [address], [place], [place] [address]   Sample to Blood Bank     Status: None   Collection Time: 02/06/23  1:47 AM  Result Value Ref Range   Blood Bank Specimen SAMPLE AVAILABLE FOR TESTING    Sample Expiration      02/09/2023,2359 Performed at [facility] Lab, [address] [place], [place] [address]   I-Stat Chem 8, ED     Status: Abnormal   Collection Time: 02/06/23  1:55 AM  Result Value Ref Range   Sodium 138 135 - 145 mmol/L   Potassium 3.4 (L) 3.5 - 5.1 mmol/L   Chloride 107 98 - 111 mmol/L   BUN 30 (H) 8 - 23 mg/dL   Creatinine, Ser 4.03 0.44 - 1.00 mg/dL   Glucose, Bld 474 (H) 70 - 99 mg/dL    Comment: Glucose  reference range applies only to samples taken after  fasting for at least 8 hours.   Calcium, Ion 1.20 1.15 - 1.40 mmol/L   TCO2 16 (L) 22 - 32 mmol/L   Hemoglobin 11.6 (L) 12.0 - 15.0 g/dL   HCT 25.4 (L) 27.0 - 62.3 %  I-Stat Lactic Acid, ED     Status: Abnormal   Collection Time: 02/06/23  1:56 AM  Result Value Ref Range   Lactic Acid, Venous 3.0 (HH) 0.5 - 1.9 mmol/L   Comment NOTIFIED PHYSICIAN   Urinalysis, Routine w reflex microscopic -Urine, Clean Catch     Status: Abnormal   Collection Time: 02/06/23  2:23 AM  Result Value Ref Range   Color, Urine YELLOW YELLOW   APPearance HAZY (A) CLEAR   Specific Gravity, Urine 1.029 1.005 - 1.030   pH 5.0 5.0 - 8.0   Glucose, UA NEGATIVE NEGATIVE mg/dL   Hgb urine dipstick NEGATIVE NEGATIVE   Bilirubin Urine NEGATIVE NEGATIVE   Ketones, ur NEGATIVE NEGATIVE mg/dL   Protein, ur 30 (A) NEGATIVE mg/dL   Nitrite NEGATIVE NEGATIVE   Leukocytes,Ua NEGATIVE NEGATIVE   RBC / HPF 0-5 0 - 5 RBC/hpf   WBC, UA 0-5 0 - 5 WBC/hpf   Bacteria, UA RARE (A) NONE SEEN   Squamous Epithelial / HPF 0-5 0 - 5 /HPF    Comment: Performed at [facility] Lab, [address] [place], [place] [address]   *Note: Due to a large number of results and/or encounters for the requested time period, some results have not been displayed. A complete set of results can be found in Results Review.    CT CERVICAL SPINE WO CONTRAST  Result Date: 02/06/2023 CLINICAL DATA:  Trauma. EXAM: CT HEAD WITHOUT CONTRAST CT CERVICAL SPINE WITHOUT CONTRAST TECHNIQUE: Multidetector CT imaging of the head and cervical spine was performed following the standard protocol without intravenous contrast. Multiplanar CT image reconstructions of the cervical spine were also generated. RADIATION DOSE REDUCTION: This exam was performed according to the departmental dose-optimization program which includes automated exposure control, adjustment of the mA and/or kV according to patient size and/or use of iterative reconstruction technique. COMPARISON:   None Available. FINDINGS: CT HEAD FINDINGS Brain: Mild age-related atrophy and moderate chronic microvascular ischemic changes. There is no acute intracranial hemorrhage. No mass effect or midline shift. No extra-axial fluid collection. Vascular: No hyperdense vessel or unexpected calcification. Skull: Normal. Negative for fracture or focal lesion. Sinuses/Orbits: There is mucoperiosteal thickening the ethmoid air cells. The maxillary sinuses and mastoid air cells are clear. No air-fluid level. Other: Laceration of the scalp over the vertex with multiple skin staples. CT CERVICAL SPINE FINDINGS Alignment: No acute subluxation.  Grade 1 C6-C7 anterolisthesis. Skull base and vertebrae: No acute fracture.  Osteopenia. Soft tissues and spinal canal: No prevertebral fluid or swelling. No visible canal hematoma. Disc levels:  No acute findings.  Degenerative changes. Upper chest: Biapical subpleural scarring. Other: An endotracheal and enteric tubes are partially visualized. IMPRESSION: 1. No acute intracranial pathology. Mild age-related atrophy and moderate chronic microvascular ischemic changes. 2. No acute/traumatic cervical spine pathology. Electronically Signed   By: Elgie Collard M.D.   On: 02/06/2023 02:52   CT HEAD WO CONTRAST  Result Date: 02/06/2023 CLINICAL DATA:  Trauma. EXAM: CT HEAD WITHOUT CONTRAST CT CERVICAL SPINE WITHOUT CONTRAST TECHNIQUE: Multidetector CT imaging of the head and cervical spine was performed following the standard protocol without intravenous contrast. Multiplanar CT image reconstructions of the cervical spine  were also generated. RADIATION DOSE REDUCTION: This exam was performed according to the departmental dose-optimization program which includes automated exposure control, adjustment of the mA and/or kV according to patient size and/or use of iterative reconstruction technique. COMPARISON:  None Available. FINDINGS: CT HEAD FINDINGS Brain: Mild age-related atrophy and  moderate chronic microvascular ischemic changes. There is no acute intracranial hemorrhage. No mass effect or midline shift. No extra-axial fluid collection. Vascular: No hyperdense vessel or unexpected calcification. Skull: Normal. Negative for fracture or focal lesion. Sinuses/Orbits: There is mucoperiosteal thickening the ethmoid air cells. The maxillary sinuses and mastoid air cells are clear. No air-fluid level. Other: Laceration of the scalp over the vertex with multiple skin staples. CT CERVICAL SPINE FINDINGS Alignment: No acute subluxation.  Grade 1 C6-C7 anterolisthesis. Skull base and vertebrae: No acute fracture.  Osteopenia. Soft tissues and spinal canal: No prevertebral fluid or swelling. No visible canal hematoma. Disc levels:  No acute findings.  Degenerative changes. Upper chest: Biapical subpleural scarring. Other: An endotracheal and enteric tubes are partially visualized. IMPRESSION: 1. No acute intracranial pathology. Mild age-related atrophy and moderate chronic microvascular ischemic changes. 2. No acute/traumatic cervical spine pathology. Electronically Signed   By: Elgie Collard M.D.   On: 02/06/2023 02:52   CT CHEST ABDOMEN PELVIS W CONTRAST  Result Date: 02/06/2023 CLINICAL DATA:  Seizure and fall. EXAM: CT CHEST, ABDOMEN, AND PELVIS WITH CONTRAST TECHNIQUE: Multidetector CT imaging of the chest, abdomen and pelvis was performed following the standard protocol during bolus administration of intravenous contrast. RADIATION DOSE REDUCTION: This exam was performed according to the departmental dose-optimization program which includes automated exposure control, adjustment of the mA and/or kV according to patient size and/or use of iterative reconstruction technique. CONTRAST:  75mL OMNIPAQUE IOHEXOL 350 MG/ML SOLN COMPARISON:  Chest radiograph dated 02/06/2023. FINDINGS: Evaluation of this exam is limited in the absence of intravenous contrast. CT CHEST FINDINGS Cardiovascular: There  is no cardiomegaly. Three vessel coronary vascular calcification. Trace pericardial effusion. There is moderate atherosclerotic calcification of the thoracic aorta. No aneurysmal dilatation or dissection. The central pulmonary arteries appear patent. Mediastinum/Nodes: No hilar or mediastinal adenopathy. An enteric tube is noted in the esophagus. Subcentimeter left thyroid hypodense nodule. Not clinically significant; no follow-up imaging recommended (ref: J Am Coll Radiol. 2015 Feb;12(2): 143-50).No mediastinal fluid collection. Lungs/Pleura: Right upper and right lower lobe subpleural atelectasis versus less likely contusion. There is a 2 cm focus of ground-glass nodularity in the superior segment of the left lower lobe. Additional 1.8 cm nodule in the lingula versus scarring. There is no pleural effusion or pneumothorax. Endotracheal tube with tip at the level of the carina. Recommend retraction by 3 cm. The central airways are patent. Musculoskeletal: Degenerative changes of the spine. No acute osseous pathology. CT ABDOMEN PELVIS FINDINGS No intra-abdominal free air or free fluid. Hepatobiliary: The liver is unremarkable. There is biliary dilatation, post cholecystectomy. No retained calcified stone noted in the central CBD. Pancreas: Unremarkable. No pancreatic ductal dilatation or surrounding inflammatory changes. Spleen: Normal in size without focal abnormality. Adrenals/Urinary Tract: The adrenal glands are unremarkable. There is no hydronephrosis on either side. Several subcentimeter bilateral renal hypodense lesions are too small to characterize. The visualized ureters and urinary bladder appear unremarkable. Stomach/Bowel: Enteric tube with tip in the body of the stomach. There is no bowel obstruction or active inflammation. The appendix is not visualized with certainty. No inflammatory changes identified in the right lower quadrant. Vascular/Lymphatic: Advanced aortoiliac atherosclerotic disease. The  IVC is unremarkable.  No portal venous gas. There is no adenopathy. Reproductive: The uterus is anteverted. There is a small calcified uterine fibroid. A 12 x 16 x 17 cm cystic lesion with internal septation in the pelvis most consistent with and ovarian neoplasm. Other: None Musculoskeletal: Osteopenia with degenerative changes of the spine. No acute osseous pathology. IMPRESSION: 1. No acute/traumatic intrathoracic, abdominal, or pelvic pathology. 2. Endotracheal tube with tip at the level of the carina. Recommend retraction by 3 cm. 3. Multi-septated cystic ovarian neoplasm. Gynecology referral and further evaluation with MRI without and with contrast on a nonemergent/outpatient basis recommended. 4. Pulmonary nodules as above measure up to 2 cm. Clinical correlation and follow-up with CT in 3 months recommended. 5. Right lung subpleural atelectasis versus less likely contusion. 6.  Aortic Atherosclerosis (ICD10-I70.0). Electronically Signed   By: Elgie Collard M.D.   On: 02/06/2023 02:52   DG Pelvis Portable  Result Date: 02/06/2023 CLINICAL DATA:  Trauma, fall EXAM: PORTABLE PELVIS 1-2 VIEWS COMPARISON:  None Available. FINDINGS: There is no evidence of pelvic fracture or diastasis. No pelvic bone lesions are seen. Hip joints and SI joints symmetric. IMPRESSION: No acute bony abnormality. Electronically Signed   By: Charlett Nose M.D.   On: 02/06/2023 02:11   DG Chest Port 1 View  Result Date: 02/06/2023 CLINICAL DATA:  Trauma, fall EXAM: PORTABLE CHEST 1 VIEW COMPARISON:  01/16/2023 FINDINGS: NG tube enters the stomach. Endotracheal tube is 1.3 cm above the carina. Heart and mediastinal contours are within normal limits. Aortic atherosclerosis. No confluent airspace opacities or effusions. No acute bony abnormality. IMPRESSION: No acute cardiopulmonary disease. Endotracheal tube 1.3 cm above the carina. Electronically Signed   By: Charlett Nose M.D.   On: 02/06/2023 02:10    ROS -Unable to obtain due  to condition of patient  PE Blood pressure (!) 161/78, pulse 90, temperature 98 F (36.7 C), resp. rate (!) 24, weight 80 kg, SpO2 100%. Physical Exam Constitutional: GCS 7 ([id number]), no obvious deformities Eyes: Moist conjunctiva; anicteric; PERRL Neck: Trachea midline Lungs: CTAB CV: RRR; no palpable thrills; no pitting edema GI: Abd soft, obese, nondistended; no palpable hepatosplenomegaly MSK: Ecchymosis on left shin  Results for orders placed or performed during the hospital encounter of 02/06/23 (from the past 48 hour(s))  Comprehensive metabolic panel     Status: Abnormal   Collection Time: 02/06/23  1:41 AM  Result Value Ref Range   Sodium 134 (L) 135 - 145 mmol/L   Potassium 3.3 (L) 3.5 - 5.1 mmol/L   Chloride 105 98 - 111 mmol/L   CO2 18 (L) 22 - 32 mmol/L   Glucose, Bld 184 (H) 70 - 99 mg/dL    Comment: Glucose reference range applies only to samples taken after fasting for at least 8 hours.   BUN 31 (H) 8 - 23 mg/dL   Creatinine, Ser 4.09 (H) 0.44 - 1.00 mg/dL   Calcium 9.0 8.9 - 81.1 mg/dL   Total Protein 6.7 6.5 - 8.1 g/dL   Albumin 3.3 (L) 3.5 - 5.0 g/dL   AST 21 15 - 41 U/L   ALT 16 0 - 44 U/L   Alkaline Phosphatase 36 (L) 38 - 126 U/L   Total Bilirubin 0.5 0.3 - 1.2 mg/dL   GFR, Estimated 51 (L) >60 mL/min    Comment: (NOTE) Calculated using the CKD-EPI Creatinine Equation (2021)    Anion gap 11 5 - 15    Comment: Performed at [facility], [address]  [place], [place] [address]  CBC     Status: Abnormal   Collection Time: 02/06/23  1:41 AM  Result Value Ref Range   WBC 8.3 4.0 - 10.5 K/uL   RBC 3.91 3.87 - 5.11 MIL/uL   Hemoglobin 11.4 (L) 12.0 - 15.0 g/dL   HCT 19.1 (L) 47.8 - 29.5 %   MCV 91.6 80.0 - 100.0 fL   MCH 29.2 26.0 - 34.0 pg   MCHC 31.8 30.0 - 36.0 g/dL   RDW 62.1 30.8 - 65.7 %   Platelets 287 150 - 400 K/uL   nRBC 0.0 0.0 - 0.2 %    Comment: Performed at [facility], [address] [place], [place] [address]  Ethanol      Status: None   Collection Time: 02/06/23  1:41 AM  Result Value Ref Range   Alcohol, Ethyl (B) <10 <10 mg/dL    Comment: (NOTE) Lowest detectable limit for serum alcohol is 10 mg/dL.  For medical purposes only. Performed at [facility], [address], [place], [place] [address]   Protime-INR     Status: Abnormal   Collection Time: 02/06/23  1:41 AM  Result Value Ref Range   Prothrombin Time 15.7 (H) 11.4 - 15.2 seconds   INR 1.2 0.8 - 1.2    Comment: (NOTE) INR goal varies based on device and disease states. Performed at [facility], [address], [place], [place] [address]   Sample to Blood Bank     Status: None   Collection Time: 02/06/23  1:47 AM  Result Value Ref Range   Blood Bank Specimen SAMPLE AVAILABLE FOR TESTING    Sample Expiration      02/09/2023,2359 Performed at [facility], [address], [place], [place] [address]   I-Stat Chem 8, ED     Status: Abnormal   Collection Time: 02/06/23  1:55 AM  Result Value Ref Range   Sodium 138 135 - 145 mmol/L   Potassium 3.4 (L) 3.5 - 5.1 mmol/L   Chloride 107 98 - 111 mmol/L   BUN 30 (H) 8 - 23 mg/dL   Creatinine, Ser 7.25 0.44 - 1.00 mg/dL   Glucose, Bld 366 (H) 70 - 99 mg/dL    Comment: Glucose reference range applies only to samples taken after fasting for at least 8 hours.   Calcium, Ion 1.20 1.15 - 1.40 mmol/L   TCO2 16 (L) 22 - 32 mmol/L   Hemoglobin 11.6 (L) 12.0 - 15.0 g/dL   HCT 44.0 (L) 34.7 - 42.5 %  I-Stat Lactic Acid, ED     Status: Abnormal   Collection Time: 02/06/23  1:56 AM  Result Value Ref Range   Lactic Acid, Venous 3.0 (HH) 0.5 - 1.9 mmol/L   Comment NOTIFIED PHYSICIAN   Urinalysis, Routine w reflex microscopic -Urine, Clean Catch     Status: Abnormal   Collection Time: 02/06/23  2:23 AM  Result Value Ref Range   Color, Urine YELLOW YELLOW   APPearance HAZY (A) CLEAR   Specific Gravity, Urine 1.029 1.005 - 1.030   pH 5.0 5.0 - 8.0   Glucose, UA NEGATIVE  NEGATIVE mg/dL   Hgb urine dipstick NEGATIVE NEGATIVE   Bilirubin Urine NEGATIVE NEGATIVE   Ketones, ur NEGATIVE NEGATIVE mg/dL   Protein, ur 30 (A) NEGATIVE mg/dL   Nitrite NEGATIVE NEGATIVE   Leukocytes,Ua NEGATIVE NEGATIVE   RBC / HPF 0-5 0 - 5 RBC/hpf   WBC, UA 0-5  0 - 5 WBC/hpf   Bacteria, UA RARE (A) NONE SEEN   Squamous Epithelial / HPF 0-5 0 - 5 /HPF    Comment: Performed at [facility] Lab, [address], [place], [place] [address]   *Note: Due to a large number of results and/or encounters for the requested time period, some results have not been displayed. A complete set of results can be found in Results Review.    CT CERVICAL SPINE WO CONTRAST  Result Date: 02/06/2023 CLINICAL DATA:  Trauma. EXAM: CT HEAD WITHOUT CONTRAST CT CERVICAL SPINE WITHOUT CONTRAST TECHNIQUE: Multidetector CT imaging of the head and cervical spine was performed following the standard protocol without intravenous contrast. Multiplanar CT image reconstructions of the cervical spine were also generated. RADIATION DOSE REDUCTION: This exam was performed according to the departmental dose-optimization program which includes automated exposure control, adjustment of the mA and/or kV according to patient size and/or use of iterative reconstruction technique. COMPARISON:  None Available. FINDINGS: CT HEAD FINDINGS Brain: Mild age-related atrophy and moderate chronic microvascular ischemic changes. There is no acute intracranial hemorrhage. No mass effect or midline shift. No extra-axial fluid collection. Vascular: No hyperdense vessel or unexpected calcification. Skull: Normal. Negative for fracture or focal lesion. Sinuses/Orbits: There is mucoperiosteal thickening the ethmoid air cells. The maxillary sinuses and mastoid air cells are clear. No air-fluid level. Other: Laceration of the scalp over the vertex with multiple skin staples. CT CERVICAL SPINE FINDINGS Alignment: No acute subluxation.  Grade 1 C6-C7  anterolisthesis. Skull base and vertebrae: No acute fracture.  Osteopenia. Soft tissues and spinal canal: No prevertebral fluid or swelling. No visible canal hematoma. Disc levels:  No acute findings.  Degenerative changes. Upper chest: Biapical subpleural scarring. Other: An endotracheal and enteric tubes are partially visualized. IMPRESSION: 1. No acute intracranial pathology. Mild age-related atrophy and moderate chronic microvascular ischemic changes. 2. No acute/traumatic cervical spine pathology. Electronically Signed   By: Elgie Collard M.D.   On: 02/06/2023 02:52   CT HEAD WO CONTRAST  Result Date: 02/06/2023 CLINICAL DATA:  Trauma. EXAM: CT HEAD WITHOUT CONTRAST CT CERVICAL SPINE WITHOUT CONTRAST TECHNIQUE: Multidetector CT imaging of the head and cervical spine was performed following the standard protocol without intravenous contrast. Multiplanar CT image reconstructions of the cervical spine were also generated. RADIATION DOSE REDUCTION: This exam was performed according to the departmental dose-optimization program which includes automated exposure control, adjustment of the mA and/or kV according to patient size and/or use of iterative reconstruction technique. COMPARISON:  None Available. FINDINGS: CT HEAD FINDINGS Brain: Mild age-related atrophy and moderate chronic microvascular ischemic changes. There is no acute intracranial hemorrhage. No mass effect or midline shift. No extra-axial fluid collection. Vascular: No hyperdense vessel or unexpected calcification. Skull: Normal. Negative for fracture or focal lesion. Sinuses/Orbits: There is mucoperiosteal thickening the ethmoid air cells. The maxillary sinuses and mastoid air cells are clear. No air-fluid level. Other: Laceration of the scalp over the vertex with multiple skin staples. CT CERVICAL SPINE FINDINGS Alignment: No acute subluxation.  Grade 1 C6-C7 anterolisthesis. Skull base and vertebrae: No acute fracture.  Osteopenia. Soft  tissues and spinal canal: No prevertebral fluid or swelling. No visible canal hematoma. Disc levels:  No acute findings.  Degenerative changes. Upper chest: Biapical subpleural scarring. Other: An endotracheal and enteric tubes are partially visualized. IMPRESSION: 1. No acute intracranial pathology. Mild age-related atrophy and moderate chronic microvascular ischemic changes. 2. No acute/traumatic cervical spine pathology. Electronically Signed   By: Elgie Collard  M.D.   On: 02/06/2023 02:52   CT CHEST ABDOMEN PELVIS W CONTRAST  Result Date: 02/06/2023 CLINICAL DATA:  Seizure and fall. EXAM: CT CHEST, ABDOMEN, AND PELVIS WITH CONTRAST TECHNIQUE: Multidetector CT imaging of the chest, abdomen and pelvis was performed following the standard protocol during bolus administration of intravenous contrast. RADIATION DOSE REDUCTION: This exam was performed according to the departmental dose-optimization program which includes automated exposure control, adjustment of the mA and/or kV according to patient size and/or use of iterative reconstruction technique. CONTRAST:  75mL OMNIPAQUE IOHEXOL 350 MG/ML SOLN COMPARISON:  Chest radiograph dated 02/06/2023. FINDINGS: Evaluation of this exam is limited in the absence of intravenous contrast. CT CHEST FINDINGS Cardiovascular: There is no cardiomegaly. Three vessel coronary vascular calcification. Trace pericardial effusion. There is moderate atherosclerotic calcification of the thoracic aorta. No aneurysmal dilatation or dissection. The central pulmonary arteries appear patent. Mediastinum/Nodes: No hilar or mediastinal adenopathy. An enteric tube is noted in the esophagus. Subcentimeter left thyroid hypodense nodule. Not clinically significant; no follow-up imaging recommended (ref: J Am Coll Radiol. 2015 Feb;12(2): 143-50).No mediastinal fluid collection. Lungs/Pleura: Right upper and right lower lobe subpleural atelectasis versus less likely contusion. There is a 2 cm  focus of ground-glass nodularity in the superior segment of the left lower lobe. Additional 1.8 cm nodule in the lingula versus scarring. There is no pleural effusion or pneumothorax. Endotracheal tube with tip at the level of the carina. Recommend retraction by 3 cm. The central airways are patent. Musculoskeletal: Degenerative changes of the spine. No acute osseous pathology. CT ABDOMEN PELVIS FINDINGS No intra-abdominal free air or free fluid. Hepatobiliary: The liver is unremarkable. There is biliary dilatation, post cholecystectomy. No retained calcified stone noted in the central CBD. Pancreas: Unremarkable. No pancreatic ductal dilatation or surrounding inflammatory changes. Spleen: Normal in size without focal abnormality. Adrenals/Urinary Tract: The adrenal glands are unremarkable. There is no hydronephrosis on either side. Several subcentimeter bilateral renal hypodense lesions are too small to characterize. The visualized ureters and urinary bladder appear unremarkable. Stomach/Bowel: Enteric tube with tip in the body of the stomach. There is no bowel obstruction or active inflammation. The appendix is not visualized with certainty. No inflammatory changes identified in the right lower quadrant. Vascular/Lymphatic: Advanced aortoiliac atherosclerotic disease. The IVC is unremarkable. No portal venous gas. There is no adenopathy. Reproductive: The uterus is anteverted. There is a small calcified uterine fibroid. A 12 x 16 x 17 cm cystic lesion with internal septation in the pelvis most consistent with and ovarian neoplasm. Other: None Musculoskeletal: Osteopenia with degenerative changes of the spine. No acute osseous pathology. IMPRESSION: 1. No acute/traumatic intrathoracic, abdominal, or pelvic pathology. 2. Endotracheal tube with tip at the level of the carina. Recommend retraction by 3 cm. 3. Multi-septated cystic ovarian neoplasm. Gynecology referral and further evaluation with MRI without and with  contrast on a nonemergent/outpatient basis recommended. 4. Pulmonary nodules as above measure up to 2 cm. Clinical correlation and follow-up with CT in 3 months recommended. 5. Right lung subpleural atelectasis versus less likely contusion. 6.  Aortic Atherosclerosis (ICD10-I70.0). Electronically Signed   By: Elgie Collard M.D.   On: 02/06/2023 02:52   DG Pelvis Portable  Result Date: 02/06/2023 CLINICAL DATA:  Trauma, fall EXAM: PORTABLE PELVIS 1-2 VIEWS COMPARISON:  None Available. FINDINGS: There is no evidence of pelvic fracture or diastasis. No pelvic bone lesions are seen. Hip joints and SI joints symmetric. IMPRESSION: No acute bony abnormality. Electronically Signed   By: Charlett Nose M.D.  On: 02/06/2023 02:11   DG Chest Port 1 View  Result Date: 02/06/2023 CLINICAL DATA:  Trauma, fall EXAM: PORTABLE CHEST 1 VIEW COMPARISON:  01/16/2023 FINDINGS: NG tube enters the stomach. Endotracheal tube is 1.3 cm above the carina. Heart and mediastinal contours are within normal limits. Aortic atherosclerosis. No confluent airspace opacities or effusions. No acute bony abnormality. IMPRESSION: No acute cardiopulmonary disease. Endotracheal tube 1.3 cm above the carina. Electronically Signed   By: Charlett Nose M.D.   On: 02/06/2023 02:10      Assessment/Plan: 89yoF s/p  HTN, DM, HLD, Dementia, CAD, COPD, on Eliquis, ?Seizure disorder   Scalp lac - repaired with staples by EDP. Can cover with 4x4s and wrap with bandage Depressed GCS following fall, ?Seizure disorder? Per report Ecchymoses LLE - plain films pending Dispo - assuming imaging workup negative for other traumatic injuries, would admit to ICU with CCM and we will follow for tertiary eval. May consider neurology evaluation  I spent a total of 65 minutes in both face-to-face and non-face-to-face activities, excluding procedures performed, for this visit on the date of this encounter.  [name], MD [facility]  [facility]

## 2023-02-06 NOTE — ED Notes (Signed)
Patient transported to CT 

## 2023-02-06 NOTE — ED Notes (Signed)
Laceration to head continuing to bleed. Placed quick clot and wrapped.

## 2023-02-06 NOTE — Progress Notes (Addendum)
   Tertiary Survey Note  Patient ID: Michelle [name] 1932-11-23  Admit date: 02/06/2023 Admitting Diagnoses: Scalp laceration, altered mental status  Tertiary Survey: Trauma scans reviewed: Yes Additional Imaging recommendations: Yes: XR L shoulder Labs reviewed: Yes Additional lab work recommendations: Yes: urine culture  Does the patient have any new complaints? No Cervical Collar Cleared: Yes DVT ppx ordered? No, cleared for DVT prophylaxis from trauma standpoint, defer final decision-making to primary team Note: dosing for DVT prophylaxis in the setting of trauma and normal renal function is 30mg  BID or 40mg  BID if >100kg or BMI>35 Is patient age > 31 (89 y.o.) : Yes  Exam: Gen: comfortable, no distress Neuro: sedated on EEG monitoring HEENT: PERRL Neck: c-collar, removed CV: RRR, on levo at 4 Pulm: unlabored breathing on MV Abd: soft, NT GU: clear yellow urine, foley Extr: wwp, no edema   Incidental Findings: age-related atrophy and moderate chronic microvascular ischemic changes (no further recommendations); multi-septated cystic ovarian neoplasm (recommend outpatient Gynecology referral); 2cm focus of ground-glass nodularity in the superior segment of the left lower lobe and 1.8 cm nodule in the lingula versus scarring (recommend f/u CT chest in 3 months); family notified by me of all findings.   Wound Care: dry dressing to scalp laceration, change daily or may leave open to air; remove staples on 8/1 in hospital or as outpatient  Follow-up Appointments: Trauma clinic for staple removal if patient is discharged before 8/1  Frailty Score

## 2023-02-06 NOTE — ED Notes (Signed)
Trauma start 0137

## 2023-02-06 NOTE — Procedures (Signed)
Extubation Procedure Note  Patient Details:   Name: Michelle [name] DOB: [birth year] MRN: [id number]   Airway Documentation:  Airway 7.5 mm (Active)  Secured at (cm) 23 cm 02/06/23 0757  Measured From Lips 02/06/23 0757  Secured Location Right 02/06/23 0757  Secured By [employer] 02/06/23 0757  Tube Holder Repositioned Yes 02/06/23 0757  Prone position No 02/06/23 0757  Cuff Pressure (cm H2O) Clear OR 27-39 CmH2O 02/06/23 0757  Site Condition Dry 02/06/23 0757   Vent end date: (not recorded) Vent end time: (not recorded)   Evaluation  O2 sats: stable throughout Complications: No apparent complications Patient did tolerate procedure well. Bilateral Breath Sounds: Rhonchi, Diminished   No  RT extubated pt to 4L [place]. No distress noted. VSS. Bilateral breath sounds equal. No stridor noted. Pt nods appropriately but has not spoken yet. RT will monitor as needed.  Hiram Comber 02/06/2023, 10:42 AM

## 2023-02-06 NOTE — Progress Notes (Signed)
Initial Nutrition Assessment  DOCUMENTATION CODES:   Not applicable  INTERVENTION:   Ensure Enlive po BID, each supplement provides 350 kcal and 20 grams of protein.  Encourage PO intake   NUTRITION DIAGNOSIS:   Inadequate oral intake related to acute illness as evidenced by meal completion < 50%.  GOAL:   Patient will meet greater than or equal to 90% of their needs  MONITOR:   PO intake, Supplement acceptance  REASON FOR ASSESSMENT:   Consult Enteral/tube feeding initiation and management  ASSESSMENT:   Pt with PMH of former smoker with a 55 pack year smoking hx, HTN, DM, HLD, dementia, CAD, COPD, and ovarian cyst concerning for neoplasm and pulm nodules followed outpatient now admitted after falling off the toilet and hitting her head and seizures.   Pt discussed during ICU rounds and with RN. Pt had just been extubated prior to visit. Pt confused and unable to answer any questions.   07/22 - pt just extubated  Spoke with pt's son who reports that pt lives with other son, [name]. He reports no recent weight changes with average appetite PTA. She uses a rolling walker for mobility.  She eats about 3/4 of the breakfast offered and about 1/2 of her lunch and dinner. She also drinks one Boost per day.  Pt does not have teeth and son reports that other son chops up food. Discussed with RN who will consult SLP.  Per SLP pt will need D2/thin  Medications reviewed and include: pepcid, SSI Levophed @ 4 mcg 10 mEq KCl x 5  Propofol @ 14 ml/hr provides: 369 kcal   Labs reviewed:  CBG's: 134-148   NUTRITION - FOCUSED PHYSICAL EXAM:  Flowsheet Row Most Recent Value  Orbital Region No depletion  Upper Arm Region No depletion  Thoracic and Lumbar Region No depletion  Buccal Region No depletion  Temple Region No depletion  Clavicle Bone Region No depletion  Clavicle and Acromion Bone Region Moderate depletion  Scapular Bone Region Unable to assess  Dorsal Hand No  depletion  Patellar Region Moderate depletion  Anterior Thigh Region Moderate depletion  Posterior Calf Region Moderate depletion  Edema (RD Assessment) Moderate  Hair Reviewed  Eyes Reviewed  Mouth Reviewed  Skin Reviewed  Nails Reviewed       Diet Order:   Diet Order             Diet NPO time specified  Diet effective now                   EDUCATION NEEDS:   Not appropriate for education at this time  Skin:  Skin Assessment:  (skin tear: L pretibial)  Last BM:  unknown  Height:   Ht Readings from Last 1 Encounters:  01/16/23 5\' 5"  (1.651 m)    Weight:   Wt Readings from Last 1 Encounters:  02/06/23 80 kg    BMI:  Body mass index is 29.35 kg/m.  Estimated Nutritional Needs:   Kcal:  1600-1800  Protein:  95-105 grams  Fluid:  >1.6 L/day  Cammy Copa., RD, LDN, CNSC See AMi[facility] for contact information

## 2023-02-06 NOTE — Progress Notes (Signed)
   02/06/23 0325  Spiritual Encounters  Type of Visit Initial  Care provided to: Pt and family  Conversation partners present during encounter Nurse  Referral source Trauma page  Reason for visit Trauma  OnCall Visit Yes   Chaplain responded to Trauma 2 call that was upgraded to Trauma 1.  Pt fell.  EMS communicated to me that a son was present in the home and was on his way to the ED.  Chaplain located son in the waiting area and moved him to consult room until such time as he could go back to be with the Pt.  After Pt returned from CT scan I escorted son to pt bedside. Chaplain services remain available by paging Spiritual Care

## 2023-02-06 NOTE — Progress Notes (Signed)
Pt arrived to unit and pressures started to drop. E-link notified and gave pt Neo stick and began Levo. Continued to monitor pt while e-link was on screen. Stabilized the pt and am continuing to monitor.

## 2023-02-06 NOTE — Evaluation (Signed)
Clinical/Bedside Swallow Evaluation Patient Details  Name: Michelle [name] MRN: [id number] Date of Birth: March 12, 1933  Today's Date: 02/06/2023 Time: SLP Start Time (ACUTE ONLY): 1217 SLP Stop Time (ACUTE ONLY): 1229 SLP Time Calculation (min) (ACUTE ONLY): 12 min  Past Medical History:  Past Medical History:  Diagnosis Date   Anginal pain (HCC)    Arthritis    AV block, 1st degree    CAD (coronary artery disease) 1990; 2015   Cardiac cath 1990 with Dr. Riley Kill and [name] Residual CAD, non obstructive per 2015 cath. STEMI status post stent in August 2015.   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   Promus DES to RCA with STEMI   Shortness of breath    Past Surgical History:  Past Surgical History:  Procedure Laterality Date   CATARACT EXTRACTION  2020   right and left eye    CHOLECYSTECTOMY     thinks her appendix was removed at the same time   CORONARY ANGIOPLASTY WITH STENT PLACEMENT  02/18/14   Promus DES to RCA   LEFT HEART CATHETERIZATION WITH CORONARY ANGIOGRAM N/A 02/18/2014   Procedure: LEFT HEART CATHETERIZATION WITH CORONARY ANGIOGRAM;  Surgeon: Corky Crafts, MD;  Location: [facility] CATH LAB;  Service: Cardiovascular;  Laterality: N/A;   PTCA  1990   PTCA of RCA   TRANSTHORACIC ECHOCARDIOGRAM  02/02/2012   mild LVH, EF 55-60%, Normal WM, Gr 1 DD; Mild MR   HPI:  Michelle [name] is an 89 yo female who presented as trauma after having a seizure while utilizing the bedside toilet and hit her head. Head CT and Chest  CT 7/22 negative for acute findings.  Pt with hx HTN, DM2, Hyperlipidemia, Dementia, CAD, COPD.    Assessment / Plan / Recommendation  Clinical Impression  Pt presents with functional swallowing as assessed clinically. Pt tolerated all consistencies trialed with no clinical s/s of aspiration.  Pt exhibited good oral clearance of simulated ground consistency.  Pt spit out trials of soft solids today.  Per family, pt's diet is cut up into small pieces ~1/2 inch, seemingly most consistent with mechanical soft solids.  She has no history of dysphagia. Pt with recent brief <24 hour intubation.  Her voice, when phonating is clear and strong.  She frequently spoke in a whisper, which appears to be by choice.  Pt did not follow directions for OME and AMS is likely impacting her swallowing performance today.  It may impact her ability to take oral medications as well.  Per family report she takes pills with water, larger pills one at a time.  If this proves difficult consider pills whole in puree or crushed if necessary.  Pt appers safe to initiate a PO diet and likely can return to her baseline diet when AMS improves.    Recommend chopped/ground texture diet with thin liquid.  SLP Visit Diagnosis: Dysphagia, oral phase (R13.11)    Aspiration Risk  Mild aspiration risk    Diet Recommendation Dysphagia 2 (Fine chop);Thin liquid  Liquid Administration via: Straw;Cup Medication Administration: Whole meds with liquid (as tolerated, no specific precautions) Supervision: Staff to assist with self feeding Compensations: Minimize environmental distractions;Slow rate;Small sips/bites Postural Changes: Seated upright at 90 degrees    Other  Recommendations Oral Care Recommendations: Oral care BID    Recommendations for follow up therapy are one component of a multi-disciplinary discharge planning process, led by the attending physician.  Recommendations may be updated based on patient status, additional  functional criteria and insurance authorization.  Follow up Recommendations  (TBD)      Assistance Recommended at Discharge  N/A  Functional Status Assessment Patient has had a recent decline in their functional status and demonstrates the ability to make significant improvements in function in a reasonable and predictable amount of time.  Frequency and Duration min 2x/week  2 weeks       Prognosis Prognosis for improved oropharyngeal function: Good      Swallow Study   General Date of Onset: 02/05/23 HPI: Michelle [name] is an 89 yo female who presented as trauma after having a seizure while utilizing the bedside toilet and hit her head. Head CT and Chest CT 7/22 negative for acute findings.  Pt with hx HTN, DM2, Hyperlipidemia, Dementia, CAD, COPD. Type of Study: Bedside Swallow Evaluation Previous Swallow Assessment: clinical assessment in 2021 and 2022 Diet Prior to this Study: NPO Temperature Spikes Noted: No Respiratory Status: Nasal cannula History of Recent Intubation: Yes Total duration of intubation (days): 1 days (less than 24 hours) Date extubated: 02/06/23 Behavior/Cognition: Alert;Cooperative;Confused;Agitated;Impulsive;Distractible Oral Cavity Assessment: Within Functional Limits Oral Care Completed by SLP: Yes Oral Cavity - Dentition: Edentulous Self-Feeding Abilities: Total assist Patient Positioning: Upright in bed Baseline Vocal Quality: Normal Volitional Cough: Cognitively unable to elicit Volitional Swallow: Unable to elicit    Oral/Motor/Sensory Function Overall Oral Motor/Sensory Function:  (unable to assess)   Ice Chips Ice chips: Not tested   Thin Liquid Thin Liquid: Within functional limits Presentation: Cup;Spoon;Straw    Nectar Thick Nectar Thick Liquid: Not tested   Honey Thick Honey Thick Liquid: Not tested   Puree Puree: Within functional limits Presentation: Spoon   Solid     Solid: Impaired Oral Phase Impairments: Impaired mastication       Kerrie Pleasure, MA, CCC-SLP Acute Rehabilitation Services Office: [phone] 02/06/2023,12:45 PM

## 2023-02-06 NOTE — TOC CAGE-AID Note (Signed)
Transition of Care [facility]) - CAGE-AID Screening   Patient Details  Name: Michelle [name] MRN: [id number] Date of Birth: [birth year]  [facility] of [facility]) CM/SW Contact:    Katha Hamming, RN Phone Number: 02/06/2023, 3:42 AM   CAGE-AID Screening: Substance Abuse Screening unable to be completed due to: : Patient unable to participate (intubated/sedated)

## 2023-02-06 NOTE — ED Provider Notes (Signed)
[place] [facility] AT [facility] Provider Note   CSN: [id number] Arrival date & time: 02/06/23  0141     History  Chief Complaint  Patient presents with   Marletta Lor    Michelle [name] is a 89 y.o. female.  Patient with past medical history significant for seizures, dementia, adnexal mass, frequent falls, CHF, CAD, type II DM, COPD presents to the emergency department via EMS as a level 1 trauma/fall on thinners.  EMS reports that patient was using her bedside toilet and husband reportedly witnessed a seizure.  Prior to EMS arrival the patient fell off the toilet and hit her head.  The patient became confused and combative when leaving the house.  EMS administered 5 mg of Versed during transport and patient remained combative.  Upon arrival the patient was awake but not oriented, GCS of 7, combative, and intubated upon arrival.  Patient son arrives at bedside and provides additional history.  Patient reportedly had a small seizure-like episode, described as staring off into space which lasted for approximately 2 minutes at around 5 PM.  She recovered completely from this with no reported lingering effects.  He states that she fell off the bedside commode when he went to open the door for EMS tonight.  He states she takes Lamictal 25 mg twice daily.  He does report that she is a full code at this time.  HPI     Home Medications Prior to Admission medications   Medication Sig Start Date End Date Taking? Authorizing Provider  acetaminophen (TYLENOL) 650 MG CR tablet Take 1,300 mg by mouth 3 (three) times daily. Patient take 3 times a day.    [provider]  albuterol (PROVENTIL) (2.5 MG/3ML) 0.083% nebulizer solution USE THREE MILLILITERS VIA NEBULIZATION BY MOUTH EVERY 6 HOURS AS NEEDED FOR WHEEZING OR SHORTNESS OF BREATH 10/12/21   Shelva Majestic, MD  albuterol (VENTOLIN HFA) 108 (90 Base) MCG/ACT inhaler INHALE 1 PUFF EVERY 4 HOURS AS NEEDED FOR WHEEZING,  SHORTNESS OF BREATH (RESCUE INHALER IF ADVAIR NOT WORKING) 10/13/22   Shelva Majestic, MD  Alcohol Swabs (DROPSAFE ALCOHOL PREP) 70 % PADS USE FOR TESTING THREE TIMES DAILY 11/07/22   Shelva Majestic, MD  amLODipine (NORVASC) 2.5 MG tablet Take 2.5 mg in the morning. If afternoon blood pressure >145/90 take second tablet of 2.5 06/29/22   Shelva Majestic, MD  aspirin 81 MG tablet Take 81 mg by mouth daily with breakfast. 07/02/14   [provider]  Blood Glucose Calibration (ACCU-CHEK SMARTVIEW CONTROL) LIQD  01/11/17   [provider]  blood glucose meter kit and supplies KIT Dispense based on patient and insurance preference. Use up to four times daily as directed. Dx:E11.9 04/01/21   Shelva Majestic, MD  calcium carbonate (OSCAL) 1500 (600 Ca) MG TABS tablet Take 600 mg of elemental calcium by mouth daily with breakfast.    [provider]  carvedilol (COREG) 12.5 MG tablet Take 1 tablet (12.5 mg total) by mouth 2 (two) times daily. 12/06/22   Alver Sorrow, NP  cholecalciferol (VITAMIN D3) 25 MCG (1000 UNIT) tablet Take 1,000 Units by mouth daily with breakfast.    [provider]  clopidogrel (PLAVIX) 75 MG tablet TAKE 1 TABLET EVERY DAY 11/23/22   Shelva Majestic, MD  diclofenac Sodium (VOLTAREN) 1 % GEL APPLY 2 GRAMS TOPICALLY TO AFFECTED AREA FOUR TIMES A DAY 08/08/22   Shelva Majestic, MD  enalapril (VASOTEC) 20  MG tablet TAKE 1 TABLET TWICE DAILY 11/07/22   Shelva Majestic, MD  fenofibrate micronized (LOFIBRA) 134 MG capsule TAKE 1 CAPSULE EVERY DAY 12/27/22   Shelva Majestic, MD  ferrous sulfate 325 (65 FE) MG tablet Twice a week Patient taking differently: Take 325 mg by mouth 2 (two) times a week. 02/21/20   Shelva Majestic, MD  fluticasone-salmeterol (ADVAIR) 250-50 MCG/ACT AEPB INHALE 1 PUFF TWICE DAILY 09/12/22   Shelva Majestic, MD  folic acid (FOLVITE) 400 MCG tablet Take 400 mcg by mouth daily with breakfast.    [provider]  furosemide (LASIX) 20 MG tablet TAKE 1 TABLET EVERY DAY 05/02/22   Alver Sorrow, NP  hydrALAZINE (APRESOLINE) 50 MG tablet Take 1 tablet (50 mg total) by mouth 3 (three) times daily. 09/06/22   Chilton Si, MD  icosapent Ethyl (VASCEPA) 1 g capsule Take 2 capsules (2 g total) by mouth 2 (two) times daily. 12/06/22   Alver Sorrow, NP  insulin glargine (LANTUS SOLOSTAR) 100 UNIT/ML Solostar Pen INJECT 17 TO 20 UNITS UNDER THE SKIN EVERY MORNING 12/08/22   Shelva Majestic, MD  Insulin Pen Needle (DROPLET PEN NEEDLES) 31G X 8 MM MISC USE AS DIRECTED WITH LANTUS SOLOSTAR PEN. Dx. E11.9 01/20/23   Shelva Majestic, MD  lamoTRIgine (LAMICTAL) 25 MG tablet TAKE 1 TABLET TWICE DAILY 12/21/22   Van Clines, MD  Lancets Misc. (ACCU-CHEK FASTCLIX LANCET) KIT 1 Device by Does not apply route. Use as directed for testing blood sugar Patient not taking: Reported on 01/20/2023    [provider]  Lancets Misc. (ACCU-CHEK FASTCLIX LANCET) KIT Use as directed to test blood sugar Patient not taking: Reported on 01/20/2023 11/13/20   Shelva Majestic, MD  loratadine (CLARITIN) 10 MG tablet Take 10 mg by mouth every other day.    [provider]  medroxyPROGESTERone (PROVERA) 10 MG tablet Take 1 tablet (10 mg total) by mouth daily. 09/12/22   Warner Mccreedy D, NP  Melatonin 5 MG CAPS Take 5 mg by mouth at bedtime.    [provider]  metFORMIN (GLUCOPHAGE-XR) 500 MG 24 hr tablet Take 1 tablet (500 mg total) by mouth daily with breakfast. 12/20/22   Shelva Majestic, MD  nitroGLYCERIN (NITROSTAT) 0.4 MG SL tablet DISSOLVE 1 TAB UNDER TONGUE FOR CHEST PAIN - IF PAIN REMAINS AFTER 5 MIN, CALL 911 AND REPEAT DOSE. MAX 3 TABS IN 15 MINUTES 01/17/23   Chilton Si, MD  nortriptyline (PAMELOR) 10 MG capsule TAKE 1 CAPSULE BY MOUTH AT BEDTIME AS NEEDED FOR PAIN PREVENTION 01/30/23   Rodolph Bong, MD  pantoprazole (PROTONIX) 40 MG tablet TAKE 1 TABLET EVERY DAY 07/13/22   Shelva Majestic, MD  psyllium (METAMUCIL) 58.6 % packet Take 1 packet by mouth daily.    [provider]  rosuvastatin (CRESTOR) 40 MG tablet TAKE 1 TABLET EVERY DAY 08/08/22   Shelva Majestic, MD  sertraline (ZOLOFT) 50 MG tablet TAKE 2 TABLETS AT BEDTIME 10/13/22   Shelva Majestic, MD  SPIRIVA HANDIHALER 18 MCG inhalation capsule PLACE 1 CAPSULE INTO HANDIHALER AND INHALE THE CONTENTS DAILY 11/23/22   Shelva Majestic, MD  spironolactone (ALDACTONE) 25 MG tablet TAKE 1 TABLET EVERY DAY 08/22/22   Shelva Majestic, MD  TRUE METRIX BLOOD GLUCOSE TEST test strip TEST BLOOD SUGAR UP TO FOUR TIMES DAILY AS DIRECTED 06/29/22   Shelva Majestic, MD  TRUEplus Lancets 33G MISC TEST  BLOOD SUGAR UP TO FOUR TIMES DAILY AS DIRECTED 06/29/22   Shelva Majestic, MD  vitamin B-12 (CYANOCOBALAMIN) 1000 MCG tablet Take 1,000 mcg by mouth daily with breakfast.    [provider]  vitamin E 400 UNIT capsule Take 400 Units by mouth at bedtime.    [provider]      Allergies    Codeine sulfate and Morphine sulfate    Review of Systems   Review of Systems  Physical Exam Updated Vital Signs BP (!) 172/90   Pulse 92   Temp (!) 97.5 F (36.4 C)   Resp 20   Wt 80 kg   SpO2 100%   BMI 29.35 kg/m  Physical Exam Constitutional: GCS 7 (E1V1M5) Eyes: Moist conjunctiva; PERRL Neck: Trachea midline Lungs: CTAB CV: RRR; no palpable thrills; no pitting edema GI: Abd soft, obese, nondistended; no palpable hepatosplenomegaly MSK: Bruising on left lower leg Skin: Laceration to occiput  ED Results / Procedures / Treatments   Labs (all labs ordered are listed, but only abnormal results are displayed) Labs Reviewed  COMPREHENSIVE METABOLIC PANEL - Abnormal; Notable for the following components:      Result Value   Sodium 134 (*)    Potassium 3.3 (*)    CO2 18 (*)    Glucose, Bld 184 (*)    BUN 31 (*)    Creatinine, Ser 1.06 (*)    Albumin 3.3 (*)    Alkaline Phosphatase 36 (*)     GFR, Estimated 51 (*)    All other components within normal limits  CBC - Abnormal; Notable for the following components:   Hemoglobin 11.4 (*)    HCT 35.8 (*)    All other components within normal limits  URINALYSIS, ROUTINE W REFLEX MICROSCOPIC - Abnormal; Notable for the following components:   APPearance HAZY (*)    Protein, ur 30 (*)    Bacteria, UA RARE (*)    All other components within normal limits  PROTIME-INR - Abnormal; Notable for the following components:   Prothrombin Time 15.7 (*)    All other components within normal limits  I-STAT CHEM 8, ED - Abnormal; Notable for the following components:   Potassium 3.4 (*)    BUN 30 (*)    Glucose, Bld 174 (*)    TCO2 16 (*)    Hemoglobin 11.6 (*)    HCT 34.0 (*)    All other components within normal limits  I-STAT CG4 LACTIC ACID, ED - Abnormal; Notable for the following components:   Lactic Acid, Venous 3.0 (*)    All other components within normal limits  CULTURE, BLOOD (ROUTINE X 2)  CULTURE, BLOOD (ROUTINE X 2)  ETHANOL  AMMONIA  LAMOTRIGINE LEVEL  CBC  BASIC METABOLIC PANEL  MAGNESIUM  PHOSPHORUS  CBG MONITORING, ED  SAMPLE TO BLOOD BANK    EKG EKG Interpretation Date/Time:  Monday February 06 2023 01:55:03 EDT Ventricular Rate:  103 PR Interval:  218 QRS Duration:  105 QT Interval:  344 QTC Calculation: 451 R Axis:   -24  Text Interpretation: Sinus tachycardia Ventricular trigeminy Prolonged PR interval Borderline left axis deviation Low voltage, precordial leads Abnormal R-wave progression, early transition Borderline T wave abnormalities When compared with ECG of 01/16/2023, No significant change was found Confirmed by Dione Booze ([id number]) on 02/06/2023 3:48:34 AM  Radiology DG Knee 1-2 Views Left  Result Date: 02/06/2023 CLINICAL DATA:  Fall EXAM: LEFT KNEE - 1-2 VIEW COMPARISON:  None Available. FINDINGS: Advanced  tricompartment degenerative changes with joint space narrowing and spurring. No joint  effusion. No acute bony abnormality. Specifically, no fracture, subluxation, or dislocation. Soft tissues are intact. Vascular calcifications noted. IMPRESSION: Advanced tricompartment degenerative changes. No acute bony abnormality. Electronically Signed   By: Charlett Nose M.D.   On: 02/06/2023 03:05   DG FEMUR MIN 2 VIEWS LEFT  Result Date: 02/06/2023 CLINICAL DATA:  Fall EXAM: LEFT FEMUR 2 VIEWS COMPARISON:  None Available. FINDINGS: No acute bony abnormality. Specifically, no fracture, subluxation, or dislocation. Advanced tricompartment degenerative changes in the left knee. No joint effusion. Soft tissues are intact. IMPRESSION: No acute bony abnormality. Electronically Signed   By: Charlett Nose M.D.   On: 02/06/2023 03:05   DG Tibia/Fibula Left  Result Date: 02/06/2023 CLINICAL DATA:  Fall EXAM: LEFT TIBIA AND FIBULA - 2 VIEW COMPARISON:  None Available. FINDINGS: No acute bony abnormality. Specifically, no fracture, subluxation, or dislocation. Advanced tricompartment degenerative changes in the left knee. Soft tissues are intact. Mild anterior soft tissue swelling over the proximal tibia. IMPRESSION: No acute bony abnormality. Electronically Signed   By: Charlett Nose M.D.   On: 02/06/2023 03:04   CT CERVICAL SPINE WO CONTRAST  Result Date: 02/06/2023 CLINICAL DATA:  Trauma. EXAM: CT HEAD WITHOUT CONTRAST CT CERVICAL SPINE WITHOUT CONTRAST TECHNIQUE: Multidetector CT imaging of the head and cervical spine was performed following the standard protocol without intravenous contrast. Multiplanar CT image reconstructions of the cervical spine were also generated. RADIATION DOSE REDUCTION: This exam was performed according to the departmental dose-optimization program which includes automated exposure control, adjustment of the mA and/or kV according to patient size and/or use of iterative reconstruction technique. COMPARISON:  None Available. FINDINGS: CT HEAD FINDINGS Brain: Mild age-related atrophy  and moderate chronic microvascular ischemic changes. There is no acute intracranial hemorrhage. No mass effect or midline shift. No extra-axial fluid collection. Vascular: No hyperdense vessel or unexpected calcification. Skull: Normal. Negative for fracture or focal lesion. Sinuses/Orbits: There is mucoperiosteal thickening the ethmoid air cells. The maxillary sinuses and mastoid air cells are clear. No air-fluid level. Other: Laceration of the scalp over the vertex with multiple skin staples. CT CERVICAL SPINE FINDINGS Alignment: No acute subluxation.  Grade 1 C6-C7 anterolisthesis. Skull base and vertebrae: No acute fracture.  Osteopenia. Soft tissues and spinal canal: No prevertebral fluid or swelling. No visible canal hematoma. Disc levels:  No acute findings.  Degenerative changes. Upper chest: Biapical subpleural scarring. Other: An endotracheal and enteric tubes are partially visualized. IMPRESSION: 1. No acute intracranial pathology. Mild age-related atrophy and moderate chronic microvascular ischemic changes. 2. No acute/traumatic cervical spine pathology. Electronically Signed   By: Elgie Collard M.D.   On: 02/06/2023 02:52   CT HEAD WO CONTRAST  Result Date: 02/06/2023 CLINICAL DATA:  Trauma. EXAM: CT HEAD WITHOUT CONTRAST CT CERVICAL SPINE WITHOUT CONTRAST TECHNIQUE: Multidetector CT imaging of the head and cervical spine was performed following the standard protocol without intravenous contrast. Multiplanar CT image reconstructions of the cervical spine were also generated. RADIATION DOSE REDUCTION: This exam was performed according to the departmental dose-optimization program which includes automated exposure control, adjustment of the mA and/or kV according to patient size and/or use of iterative reconstruction technique. COMPARISON:  None Available. FINDINGS: CT HEAD FINDINGS Brain: Mild age-related atrophy and moderate chronic microvascular ischemic changes. There is no acute intracranial  hemorrhage. No mass effect or midline shift. No extra-axial fluid collection. Vascular: No hyperdense vessel or unexpected calcification. Skull: Normal. Negative for fracture  or focal lesion. Sinuses/Orbits: There is mucoperiosteal thickening the ethmoid air cells. The maxillary sinuses and mastoid air cells are clear. No air-fluid level. Other: Laceration of the scalp over the vertex with multiple skin staples. CT CERVICAL SPINE FINDINGS Alignment: No acute subluxation.  Grade 1 C6-C7 anterolisthesis. Skull base and vertebrae: No acute fracture.  Osteopenia. Soft tissues and spinal canal: No prevertebral fluid or swelling. No visible canal hematoma. Disc levels:  No acute findings.  Degenerative changes. Upper chest: Biapical subpleural scarring. Other: An endotracheal and enteric tubes are partially visualized. IMPRESSION: 1. No acute intracranial pathology. Mild age-related atrophy and moderate chronic microvascular ischemic changes. 2. No acute/traumatic cervical spine pathology. Electronically Signed   By: Elgie Collard M.D.   On: 02/06/2023 02:52   CT CHEST ABDOMEN PELVIS W CONTRAST  Result Date: 02/06/2023 CLINICAL DATA:  Seizure and fall. EXAM: CT CHEST, ABDOMEN, AND PELVIS WITH CONTRAST TECHNIQUE: Multidetector CT imaging of the chest, abdomen and pelvis was performed following the standard protocol during bolus administration of intravenous contrast. RADIATION DOSE REDUCTION: This exam was performed according to the departmental dose-optimization program which includes automated exposure control, adjustment of the mA and/or kV according to patient size and/or use of iterative reconstruction technique. CONTRAST:  75mL OMNIPAQUE IOHEXOL 350 MG/ML SOLN COMPARISON:  Chest radiograph dated 02/06/2023. FINDINGS: Evaluation of this exam is limited in the absence of intravenous contrast. CT CHEST FINDINGS Cardiovascular: There is no cardiomegaly. Three vessel coronary vascular calcification. Trace  pericardial effusion. There is moderate atherosclerotic calcification of the thoracic aorta. No aneurysmal dilatation or dissection. The central pulmonary arteries appear patent. Mediastinum/Nodes: No hilar or mediastinal adenopathy. An enteric tube is noted in the esophagus. Subcentimeter left thyroid hypodense nodule. Not clinically significant; no follow-up imaging recommended (ref: J Am Coll Radiol. 2015 Feb;12(2): 143-50).No mediastinal fluid collection. Lungs/Pleura: Right upper and right lower lobe subpleural atelectasis versus less likely contusion. There is a 2 cm focus of ground-glass nodularity in the superior segment of the left lower lobe. Additional 1.8 cm nodule in the lingula versus scarring. There is no pleural effusion or pneumothorax. Endotracheal tube with tip at the level of the carina. Recommend retraction by 3 cm. The central airways are patent. Musculoskeletal: Degenerative changes of the spine. No acute osseous pathology. CT ABDOMEN PELVIS FINDINGS No intra-abdominal free air or free fluid. Hepatobiliary: The liver is unremarkable. There is biliary dilatation, post cholecystectomy. No retained calcified stone noted in the central CBD. Pancreas: Unremarkable. No pancreatic ductal dilatation or surrounding inflammatory changes. Spleen: Normal in size without focal abnormality. Adrenals/Urinary Tract: The adrenal glands are unremarkable. There is no hydronephrosis on either side. Several subcentimeter bilateral renal hypodense lesions are too small to characterize. The visualized ureters and urinary bladder appear unremarkable. Stomach/Bowel: Enteric tube with tip in the body of the stomach. There is no bowel obstruction or active inflammation. The appendix is not visualized with certainty. No inflammatory changes identified in the right lower quadrant. Vascular/Lymphatic: Advanced aortoiliac atherosclerotic disease. The IVC is unremarkable. No portal venous gas. There is no adenopathy.  Reproductive: The uterus is anteverted. There is a small calcified uterine fibroid. A 12 x 16 x 17 cm cystic lesion with internal septation in the pelvis most consistent with and ovarian neoplasm. Other: None Musculoskeletal: Osteopenia with degenerative changes of the spine. No acute osseous pathology. IMPRESSION: 1. No acute/traumatic intrathoracic, abdominal, or pelvic pathology. 2. Endotracheal tube with tip at the level of the carina. Recommend retraction by 3 cm. 3. Multi-septated cystic ovarian  neoplasm. Gynecology referral and further evaluation with MRI without and with contrast on a nonemergent/outpatient basis recommended. 4. Pulmonary nodules as above measure up to 2 cm. Clinical correlation and follow-up with CT in 3 months recommended. 5. Right lung subpleural atelectasis versus less likely contusion. 6.  Aortic Atherosclerosis (ICD10-I70.0). Electronically Signed   By: Elgie Collard M.D.   On: 02/06/2023 02:52   DG Pelvis Portable  Result Date: 02/06/2023 CLINICAL DATA:  Trauma, fall EXAM: PORTABLE PELVIS 1-2 VIEWS COMPARISON:  None Available. FINDINGS: There is no evidence of pelvic fracture or diastasis. No pelvic bone lesions are seen. Hip joints and SI joints symmetric. IMPRESSION: No acute bony abnormality. Electronically Signed   By: Charlett Nose M.D.   On: 02/06/2023 02:11   DG Chest Port 1 View  Result Date: 02/06/2023 CLINICAL DATA:  Trauma, fall EXAM: PORTABLE CHEST 1 VIEW COMPARISON:  01/16/2023 FINDINGS: NG tube enters the stomach. Endotracheal tube is 1.3 cm above the carina. Heart and mediastinal contours are within normal limits. Aortic atherosclerosis. No confluent airspace opacities or effusions. No acute bony abnormality. IMPRESSION: No acute cardiopulmonary disease. Endotracheal tube 1.3 cm above the carina. Electronically Signed   By: Charlett Nose M.D.   On: 02/06/2023 02:10    Procedures .Critical Care  Performed by: Darrick Grinder, PA-C Authorized by:  Darrick Grinder, PA-C   Critical care provider statement:    Critical care time (minutes):  30   Critical care was time spent personally by me on the following activities:  Development of treatment plan with patient or surrogate, discussions with consultants, evaluation of patient's response to treatment, examination of patient, ordering and review of laboratory studies, ordering and review of radiographic studies, ordering and performing treatments and interventions, pulse oximetry, re-evaluation of patient's condition and review of old charts Procedure Name: Intubation Date/Time: 02/06/2023 3:27 AM  Performed by: Darrick Grinder, PA-CPre-anesthesia Checklist: Patient identified, Emergency Drugs available, Suction available and Patient being monitored Oxygen Delivery Method: Ambu bag Preoxygenation: Pre-oxygenation with 100% oxygen Induction Type: IV induction Ventilation: Mask ventilation without difficulty Laryngoscope Size: Glidescope Grade View: Grade III Tube size: 7.5 mm Number of attempts: 1 Airway Equipment and Method: Video-laryngoscopy Placement Confirmation: ETT inserted through vocal cords under direct vision, CO2 detector and Breath sounds checked- equal and bilateral Secured at: 23 cm Tube secured with: ETT holder Dental Injury: Teeth and Oropharynx as per pre-operative assessment  Difficulty Due To: Difficult Airway- due to dentition    .Marland KitchenLaceration Repair  Date/Time: 02/06/2023 4:12 AM  Performed by: Darrick Grinder, PA-C Authorized by: Darrick Grinder, PA-C   Consent:    Consent obtained:  Emergent situation Laceration details:    Length (cm):  5   Depth (mm):  5 Treatment:    Area cleansed with:  Saline Skin repair:    Repair method:  Staples Repair type:    Repair type:  Simple Post-procedure details:    Dressing:  Open (no dressing)     Medications Ordered in ED Medications  etomidate (AMIDATE) injection (25 mg Intravenous Given 02/06/23  0146)  rocuronium (ZEMURON) injection (100 mg Intravenous Given 02/06/23 0147)  0.9 %  sodium chloride infusion (0 mLs Intravenous Stopped 02/06/23 0301)  propofol (DIPRIVAN) 1000 MG/100ML infusion (25 mcg/kg/min  80 kg (Order-Specific) Intravenous Rate/Dose Change 02/06/23 0400)  fentaNYL (SUBLIMAZE) injection 75 mcg (has no administration in time range)  docusate (COLACE) 50 MG/5ML liquid 100 mg (has no administration in time range)  polyethylene glycol (MIRALAX /  GLYCOLAX) packet 17 g (has no administration in time range)  lactated ringers infusion (has no administration in time range)  famotidine (PEPCID) tablet 20 mg (has no administration in time range)  iohexol (OMNIPAQUE) 350 MG/ML injection 75 mL (75 mLs Intravenous Contrast Given 02/06/23 0220)  potassium chloride 10 mEq in 100 mL IVPB (0 mEq Intravenous Stopped 02/06/23 0411)  Tdap (BOOSTRIX) injection 0.5 mL (0.5 mLs Intramuscular Given 02/06/23 0300)  labetalol (NORMODYNE) injection 5 mg (5 mg Intravenous Given 02/06/23 0407)    ED Course/ Medical Decision Making/ A&P                             Medical Decision Making Amount and/or Complexity of Data Reviewed Labs: ordered. Radiology: ordered. ECG/medicine tests: ordered.  Risk Prescription drug management.   This patient presents to the ED for concern of altered mental status, this involves an extensive number of treatment options, and is a complaint that carries with it a high risk of complications and morbidity.  The differential diagnosis includes seizure, intracranial abnormality, concussion, metabolic abnormality, encephalopathy, others   Co morbidities that complicate the patient evaluation  Dementia, seizure history   Additional history obtained:  Additional history obtained from EMS, patient's son External records from outside source obtained and reviewed including family medicine notes   Lab Tests:  I Ordered, and personally interpreted labs.  The  pertinent results include: Lactic acid 3.0, no gross abnormalities on CBC or CMP, grossly unremarkable UA   Imaging Studies ordered:  I ordered imaging studies including CT scans of the cervical spine and head without contrast, chest abdomen pelvis with contrast, plain films of the left leg, pelvis, and chest I independently visualized and interpreted imaging which showed  1. No acute intracranial pathology. Mild age-related atrophy and  moderate chronic microvascular ischemic changes.  2. No acute/traumatic cervical spine pathology.   1. No acute/traumatic intrathoracic, abdominal, or pelvic pathology.  2. Endotracheal tube with tip at the level of the carina. Recommend  retraction by 3 cm.  3. Multi-septated cystic ovarian neoplasm. Gynecology referral and  further evaluation with MRI without and with contrast on a  nonemergent/outpatient basis recommended.  4. Pulmonary nodules as above measure up to 2 cm. Clinical  correlation and follow-up with CT in 3 months recommended.  5. Right lung subpleural atelectasis versus less likely contusion.  6.  Aortic Atherosclerosis   No acute cardiopulmonary disease.    Endotracheal tube 1.3 cm above the carina.  No acute abnormalities on plain films of the pelvis or left leg I agree with the radiologist interpretation   Cardiac Monitoring: / EKG:  The patient was maintained on a cardiac monitor.  I personally viewed and interpreted the cardiac monitored which showed an underlying rhythm of: Sinus tachycardia with ventricular trigeminy   Consultations Obtained:  I requested consultation with the trauma surgeon, Dr. Cliffton Asters,  and discussed lab and imaging findings as well as pertinent plan - they recommend: Admission to [facility], will follow for tertiary care  Requested consultation with the intensivist. Dr.Marshall agrees to see the patient for admission   Problem List / ED Course / Critical interventions / Medication management   I ordered  medication including etomidate, rocuronium, propofol, potassium, Tdap  Reevaluation of the patient after these medicines showed that the patient improved I have reviewed the patients home medicines and have made adjustments as needed   Test / Admission - Considered:  Patient had to  be intubated for airway protection due to combativeness and due to the need for sedation for advanced imaging.  Patient with apparent seizure and possible concussion due to head injury from fall.  Patient will need admission to ICU due to need for airway management and continued advance care         Final Clinical Impression(s) / ED Diagnoses Final diagnoses:  Fall, initial encounter  Laceration of scalp, initial encounter  Injury of head, initial encounter  Altered mental status, unspecified altered mental status type    Rx / DC Orders ED Discharge Orders     None         Pamala Duffel 02/06/23 0412    Marily Memos, MD 02/06/23 [phone]

## 2023-02-06 NOTE — ED Triage Notes (Addendum)
Patient arrives via EMS for fall on thinners. Patient takes eliquis. Patient was given 5mg  of versed in route.   EMS reports husband called out because patient was using the bedside toilet and having a seizure. Midway through the call the patient fell off the toilet and hit her head. EMS arrived patient was moving laying on left lateral side. C-collar placed by EMS. Patient became confused post-ictal and combative when leaving the house. EMS gave 5mg  of versed. 18G LAC. Hx DM, AFib

## 2023-02-06 NOTE — Progress Notes (Signed)
Orthopedic Tech Progress Note Patient Details:  Michelle [name] August 30, 1932 [id number]  Patient ID: [name], female   DOB: 21-Dec-1932, 89 y.o.   MRN: [id number] I attended trauma page. Trinna Post 02/06/2023, 2:01 AM

## 2023-02-06 NOTE — Procedures (Signed)
Patient Name: Michelle [name]  MRN: [id number]  Epilepsy Attending: Charlsie Quest  Referring Physician/Provider: Briant Sites, DO  Date: 02/06/2023 Duration: 23.55 mins  Patient history: 89yo F with h/o seizures now with ams. EG to evaluate for seizure  Level of alertness: lethargic, sleep  AEDs during EEG study: LTG  Technical aspects: This EEG study was done with scalp electrodes positioned according to the 10-20 International system of electrode placement. Electrical activity was reviewed with band pass filter of 1-70Hz , sensitivity of 7 uV/mm, display speed of 62mm/sec with a 60Hz  notched filter applied as appropriate. EEG data were recorded continuously and digitally stored.  Video monitoring was available and reviewed as appropriate.  Description: EEG showed continuous/intermittent generalized polymorphic sharply contoured 3 to 6 Hz theta-delta slowing. Sleep was characterized by vertex waves, sleep spindles (12 to 14 Hz), maximal frontocentral region. Hyperventilation and photic stimulation were not performed.     ABNORMALITY - Continuous slow, generalized  IMPRESSION: This study is suggestive of moderate to severe diffuse encephalopathy, nonspecific etiology. No seizures or epileptiform discharges were seen throughout the recording.   Talli Kimmer Annabelle Harman

## 2023-02-06 NOTE — Progress Notes (Signed)
EEG complete - results pending 

## 2023-02-06 NOTE — Consult Note (Signed)
Neurology Consultation  Reason for Consult: seizures Referring Physician: Dr. Judeth Horn  CC: seizure with a fall   History is obtained from:medical record and son at the bedside   HPI: Michelle [name] is a 89 y.o. female with past medical history of HTN, HLD, DM, MI,  CAD s/p DES stent, seizure disorder on Lamictal daily who presented as a trauma from home via EMS for seizure activity and falling off the commode. Per son at the bedside she had 2 episodes of seizure like activity prior to EMS arriving, both occurring while using the bathroom and the 2nd while on the commode and then experienced a fall off the commode hitting her head both episodes lasting about 2 minutes each. Per son she had a staring episode with mouth clenching which is not her typical seizure activity. Usual seizure is staring. 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 She was noted to be combative and was given versed by EMS and then required intubation in the ED for agitation.   EEG with continuous generalized slowing. Lamotrigine level ordered. Neurology consulted.   She is intubated on IV propofol for sedation, she moves everything spontaneously. With sedation paused she is able to follow commands but gets very agitated.    ROS:  Unable to obtain due to altered mental status.   Past Medical History:  Diagnosis Date   Anginal pain (HCC)    Arthritis    AV block, 1st degree    CAD (coronary artery disease) 1990; 2015   Cardiac cath 1990 with Dr. Riley Kill and [name] Residual CAD, non obstructive per 2015 cath. STEMI status post stent in August 2015.   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 Her smoking use included cigarettes. She started smoking about 71 years ago. She has a 55 pack-year smoking history. She has never used smokeless tobacco. She reports that she does not drink alcohol and does not use drugs.  Medications  Current Facility-Administered Medications:    0.9 %  sodium chloride infusion, 250 mL, Intravenous, Continuous, Paliwal, Aditya, MD   arformoterol (BROVANA) nebulizer solution 15 mcg, 15 mcg, Nebulization, BID, Hunsucker, Lesia Sago, MD   Chlorhexidine Gluconate Cloth 2 % PADS 6 each, 6 each, Topical, Daily, Briant Sites, DO  docusate (COLACE) 50 MG/5ML liquid 100 mg, 100 mg, Per Tube, BID PRN, Briant Sites, DO   enoxaparin (LOVENOX) injection 30 mg, 30 mg, Subcutaneous, Q12H, Hunsucker, Lesia Sago, MD   famotidine (PEPCID) tablet 20 mg, 20 mg, Per Tube, BID, Briant Sites, DO   feeding supplement (PROSource TF20) liquid 60 mL, 60 mL, Per Tube, Daily, Hunsucker, Lesia Sago, MD   feeding supplement (VITAL HIGH PROTEIN) liquid 1,000 mL, 1,000 mL, Per Tube, Q24H, Hunsucker, Lesia Sago, MD   fentaNYL (SUBLIMAZE) injection 75 mcg, 75 mcg, Intravenous, Q1H PRN, Darrick Grinder, PA-C, 75 mcg at 02/06/23 4098   insulin aspart (novoLOG) injection 0-9 Units, 0-9 Units,  Subcutaneous, Q4H, Gaynell Face, Jessica, DO   norepinephrine (LEVOPHED) 4mg  in (0.016 mg/mL) premix infusion, 2-10 mcg/min, Intravenous, Titrated, Paliwal, Aditya, MD, Last Rate: 15 mL/hr at 02/06/23 0800, 4 mcg/min at 02/06/23 0800   Oral care mouth rinse, 15 mL, Mouth Rinse, Q2H, Marshall, Jessica, DO, 15 mL at 02/06/23 0827   Oral care mouth rinse, 15 mL, Mouth Rinse, PRN, Briant Sites, DO   PHENYLephrine 80 mcg/ml in normal saline Adult IV Push Syringe (For Blood Pressure Support), 80-200 mcg, Intravenous, Q5 min PRN, Paliwal, Aditya, MD   polyethylene glycol (MIRALAX / GLYCOLAX) packet 17 g, 17 g, Per Tube, Daily PRN, Briant Sites, DO   potassium chloride 10 mEq in 100 mL IVPB, 10 mEq, Intravenous, Q1 Hr x 5, Paliwal, Aditya, MD, Last Rate: 100 mL/hr at 02/06/23 0924, 10 mEq at 02/06/23 0924   propofol (DIPRIVAN) 1000 MG/100ML infusion, 5-80 mcg/kg/min, Intravenous, Titrated, Paliwal, Aditya, MD, Last Rate: 14.4 mL/hr at 02/06/23 0812, 30 mcg/kg/min at 02/06/23 1191   revefenacin (YUPELRI) nebulizer solution 175 mcg, 175 mcg, Nebulization, Daily, Hunsucker, Lesia Sago, MD   Exam: Current vital signs: BP 101/61   Pulse 90   Temp 99.1 F (37.3 C)   Resp (!) 22   Wt 80 kg   SpO2 96%   BMI 29.35 kg/m  Vital signs in last 24 hours: Temp:  [97.4 F (36.3 C)-99.1 F (37.3 C)] 99.1 F (37.3 C) (07/22 0845) Pulse Rate:  [65-109] 90 (07/22 0845) Resp:  [16-26] 22 (07/22 0845) BP: (61-186)/(37-111) 101/61 (07/22 0845) SpO2:  [92 %-100 %] 96 % (07/22 0845) FiO2 (%):  [40 %-100 %] 40 % (07/22 0757) Weight:  [80 kg] 80 kg (07/22 0201)  GENERAL: critically ill female intubated and sedated  HEENT: - Normocephalic and atraumatic, dry mm, ET tube, OG tube  LUNGS - Clear to auscultation bilaterally with no wheezes CV - S1S2 RRR, no m/r/g, equal pulses bilaterally. ABDOMEN - Soft, nontender, nondistended with normoactive BS Ext: warm, well perfused, intact peripheral pulses, no   edema  NEURO:  Mental Status: intubated and sedated. With sedation paused can follow commands but becomes very agitated.  Language: speech is unable to assess.  Cranial Nerves: PERRL EOMI, visual fields full, no facial asymmetry, facial sensation intact, hearing intact, tongue/uvula/soft palate midline, normal sternocleidomastoid and trapezius muscle strength. No evidence of tongue atrophy or fibrillations Motor: 5/5 in all 4 extremities  Tone: is normal and bulk is normal Sensation- responds to noxious stimuli in all 4 extremities  Coordination: unable to assess Gait- deferred   Labs I have reviewed labs in epic and the results pertinent to this consultation are:  CBC    Component Value Date/Time   WBC 8.3 02/06/2023 0141   RBC 3.91 02/06/2023 0141   HGB 10.5 (L) 02/06/2023 0850   HGB 13.0 12/14/2016  1606   HCT 31.0 (L) 02/06/2023 0850   HCT 42.3 08/19/2020 1330   PLT 287 02/06/2023 0141   PLT 245 12/14/2016 1606   MCV 91.6 02/06/2023 0141   MCV 87 12/14/2016 1606   MCH 29.2 02/06/2023 0141   MCHC 31.8 02/06/2023 0141   RDW 14.0 02/06/2023 0141   RDW 13.7 12/14/2016 1606   LYMPHSABS 1.4 01/16/2023 1100   MONOABS 0.6 01/16/2023 1100   EOSABS 0.1 01/16/2023 1100   BASOSABS 0.1 01/16/2023 1100    CMP     Component Value Date/Time   NA 138 02/06/2023 0850   NA 139 12/14/2016 1606   K 3.5 02/06/2023 0850   CL 107 02/06/2023 0155   CO2 18 (L) 02/06/2023 0141   GLUCOSE 174 (H) 02/06/2023 0155   GLUCOSE 124 (H) 07/28/2006 1202   BUN 30 (H) 02/06/2023 0155   BUN 16 12/14/2016 1606   CREATININE 1.00 02/06/2023 0155   CREATININE 0.98 (H) 07/03/2020 0938   CALCIUM 9.0 02/06/2023 0141   PROT 6.7 02/06/2023 0141   ALBUMIN 3.3 (L) 02/06/2023 0141   AST 21 02/06/2023 0141   ALT 16 02/06/2023 0141   ALKPHOS 36 (L) 02/06/2023 0141   BILITOT 0.5 02/06/2023 0141   GFRNONAA 51 (L) 02/06/2023 0141   GFRNONAA 66 07/01/2020 0927   GFRAA 77 07/01/2020 0927    Lipid Panel      Component Value Date/Time   CHOL 122 08/19/2022 0912   TRIG 183.0 (H) 08/19/2022 0912   TRIG 1693 (HH) 07/28/2006 1202   HDL 53.40 08/19/2022 0912   CHOLHDL 2 08/19/2022 0912   VLDL 36.6 08/19/2022 0912   LDLCALC 32 08/19/2022 0912   LDLCALC 42 02/18/2020 1529   LDLDIRECT 44.0 07/08/2021 1612    Lab Results  Component Value Date   HGBA1C 5.5 12/19/2022    EEG: severe diffuse encephalopathy - no seizures.  Imaging I have reviewed the images obtained:  CT-head- no acute process   Assessment: 89 y.o. female with past medical history of HTN, HLD, DM, MI,  CAD s/p DES stent, seizure disorder on Lamictal daily who presented as a trauma from home via EMS for seizure activity and falling off the commode. Per report had 2 episodes of starring off and mouth clenching with the 2nd episode causing her to fall of the commode.  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 Sees [facility] for memory issues and seizures. On lamictal 25mg  daily per [facility] notes. Med list has 25 BID. Will get details from pharmacy.  Impression: ?Breakthrough Seizures Evaluate for syncope  Recommendations: - Wean sedation and extubate patient when able  - Will hold off on LTM for now and follow neurological exam  - Hold home lamictal till able to take PO meds - neurology will continue to follow  Gevena Mart DNP, ACNPC-AG  [facility]  Attending Neurohospitalist Addendum Patient seen and examined with APP/Resident. Agree with the history and physical as documented above. Agree with the plan as documented, which I helped formulate. I have independently reviewed the chart, obtained history, review of systems and examined the patient.I have personally reviewed pertinent head/neck/spine imaging (CT/MRI). Plan d.w Dr. Judeth Horn and Kandice Robinsons, NP, PCCM. Please feel free to call with any questions.  -- Milon Dikes,  MD Neurologist [facility] Pager: [phone]   CRITICAL CARE ATTESTATION Performed by: Milon Dikes, MD Total critical care time: 41 minutes Critical care time was exclusive  of separately billable procedures and treating other patients and/or supervising APPs/Residents/Students Critical care was necessary to treat or prevent imminent or life-threatening deterioration. This patient is critically ill and at significant risk for neurological worsening and/or death and care requires constant monitoring. Critical care was time spent personally by me on the following activities: development of treatment plan with patient and/or surrogate as well as nursing, discussions with consultants, evaluation of patient's response to treatment, examination of patient, obtaining history from patient or surrogate, ordering and performing treatments and interventions, ordering and review of laboratory studies, ordering and review of radiographic studies, pulse oximetry, re-evaluation of patient's condition, participation in multidisciplinary rounds and medical decision making of high complexity in the care of this patient.

## 2023-02-07 ENCOUNTER — Encounter (HOSPITAL_COMMUNITY): Payer: Self-pay | Admitting: Critical Care Medicine

## 2023-02-07 ENCOUNTER — Other Ambulatory Visit: Payer: Self-pay

## 2023-02-07 DIAGNOSIS — S069X1D Unspecified intracranial injury with loss of consciousness of 30 minutes or less, subsequent encounter: Secondary | ICD-10-CM

## 2023-02-07 DIAGNOSIS — R4182 Altered mental status, unspecified: Secondary | ICD-10-CM

## 2023-02-07 DIAGNOSIS — S0101XA Laceration without foreign body of scalp, initial encounter: Secondary | ICD-10-CM

## 2023-02-07 DIAGNOSIS — R569 Unspecified convulsions: Secondary | ICD-10-CM

## 2023-02-07 LAB — CULTURE, BLOOD (ROUTINE X 2)
Culture: NO GROWTH
Special Requests: ADEQUATE

## 2023-02-07 LAB — CBC WITH DIFFERENTIAL/PLATELET
Abs Immature Granulocytes: 0.03 10*3/uL (ref 0.00–0.07)
Basophils Absolute: 0 10*3/uL (ref 0.0–0.1)
Basophils Relative: 1 %
Eosinophils Absolute: 0.1 10*3/uL (ref 0.0–0.5)
Eosinophils Relative: 1 %
HCT: 28.8 % — ABNORMAL LOW (ref 36.0–46.0)
Hemoglobin: 9.5 g/dL — ABNORMAL LOW (ref 12.0–15.0)
Immature Granulocytes: 0 %
Lymphocytes Relative: 21 %
Lymphs Abs: 1.5 10*3/uL (ref 0.7–4.0)
MCH: 29.7 pg (ref 26.0–34.0)
MCHC: 33 g/dL (ref 30.0–36.0)
MCV: 90 fL (ref 80.0–100.0)
Monocytes Absolute: 0.7 10*3/uL (ref 0.1–1.0)
Monocytes Relative: 10 %
Neutro Abs: 4.9 10*3/uL (ref 1.7–7.7)
Neutrophils Relative %: 67 %
Platelets: 226 10*3/uL (ref 150–400)
RBC: 3.2 MIL/uL — ABNORMAL LOW (ref 3.87–5.11)
RDW: 13.8 % (ref 11.5–15.5)
WBC: 7.3 10*3/uL (ref 4.0–10.5)
nRBC: 0 % (ref 0.0–0.2)

## 2023-02-07 LAB — COMPREHENSIVE METABOLIC PANEL
ALT: 16 U/L (ref 0–44)
AST: 25 U/L (ref 15–41)
Albumin: 3.1 g/dL — ABNORMAL LOW (ref 3.5–5.0)
Alkaline Phosphatase: 27 U/L — ABNORMAL LOW (ref 38–126)
Anion gap: 8 (ref 5–15)
BUN: 15 mg/dL (ref 8–23)
CO2: 19 mmol/L — ABNORMAL LOW (ref 22–32)
Calcium: 8.3 mg/dL — ABNORMAL LOW (ref 8.9–10.3)
Chloride: 110 mmol/L (ref 98–111)
Creatinine, Ser: 0.81 mg/dL (ref 0.44–1.00)
GFR, Estimated: >60 mL/min (ref >60)
Glucose, Bld: 100 mg/dL — ABNORMAL HIGH (ref 70–99)
Potassium: 3.6 mmol/L (ref 3.5–5.1)
Sodium: 137 mmol/L (ref 135–145)
Total Bilirubin: 0.4 mg/dL (ref 0.3–1.2)
Total Protein: 5.9 g/dL — ABNORMAL LOW (ref 6.5–8.1)

## 2023-02-07 LAB — GLUCOSE, CAPILLARY
Glucose-Capillary: 104 mg/dL — ABNORMAL HIGH (ref 70–99)
Glucose-Capillary: 113 mg/dL — ABNORMAL HIGH (ref 70–99)
Glucose-Capillary: 141 mg/dL — ABNORMAL HIGH (ref 70–99)
Glucose-Capillary: 156 mg/dL — ABNORMAL HIGH (ref 70–99)
Glucose-Capillary: 174 mg/dL — ABNORMAL HIGH (ref 70–99)
Glucose-Capillary: 207 mg/dL — ABNORMAL HIGH (ref 70–99)

## 2023-02-07 LAB — TRIGLYCERIDES: Triglycerides: 239 mg/dL — ABNORMAL HIGH (ref <150)

## 2023-02-07 LAB — MAGNESIUM
Magnesium: 2.2 mg/dL (ref 1.7–2.4)
Magnesium: 2.2 mg/dL (ref 1.7–2.4)

## 2023-02-07 LAB — PHOSPHORUS
Phosphorus: 3 mg/dL (ref 2.5–4.6)
Phosphorus: 2.1 mg/dL — ABNORMAL LOW (ref 2.5–4.6)

## 2023-02-07 LAB — LAMOTRIGINE LEVEL: Lamotrigine Lvl: <1 ug/mL — ABNORMAL LOW (ref 2.0–20.0)

## 2023-02-07 MED ORDER — SERTRALINE HCL 100 MG PO TABS
100.0000 mg | ORAL_TABLET | Freq: Every day | ORAL | Status: DC
  Administered 2023-02-07: 100 mg via ORAL
  Filled 2023-02-07: qty 1

## 2023-02-07 MED ORDER — ROSUVASTATIN CALCIUM 20 MG PO TABS
40.0000 mg | ORAL_TABLET | Freq: Every day | ORAL | Status: DC
  Administered 2023-02-07 – 2023-02-18 (×12): 40 mg via ORAL
  Filled 2023-02-07 (×12): qty 2

## 2023-02-07 MED ORDER — FLUTICASONE FUROATE-VILANTEROL 200-25 MCG/ACT IN AEPB
1.0000 | INHALATION_SPRAY | Freq: Every day | RESPIRATORY_TRACT | Status: DC
  Administered 2023-02-09 – 2023-02-16 (×8): 1 via RESPIRATORY_TRACT
  Filled 2023-02-07 (×2): qty 28

## 2023-02-07 MED ORDER — LAMOTRIGINE 25 MG PO TABS
25.0000 mg | ORAL_TABLET | Freq: Two times a day (BID) | ORAL | Status: DC
  Administered 2023-02-07 – 2023-02-18 (×23): 25 mg via ORAL
  Filled 2023-02-07 (×23): qty 1

## 2023-02-07 MED ORDER — UMECLIDINIUM BROMIDE 62.5 MCG/ACT IN AEPB
1.0000 | INHALATION_SPRAY | Freq: Every day | RESPIRATORY_TRACT | Status: DC
  Administered 2023-02-09 – 2023-02-16 (×8): 1 via RESPIRATORY_TRACT
  Filled 2023-02-07 (×3): qty 7

## 2023-02-07 MED ORDER — DOCUSATE SODIUM 50 MG/5ML PO LIQD
100.0000 mg | Freq: Two times a day (BID) | ORAL | Status: DC | PRN
  Administered 2023-02-13: 100 mg via ORAL
  Filled 2023-02-07: qty 10

## 2023-02-07 MED ORDER — POLYETHYLENE GLYCOL 3350 17 G PO PACK: 17.0000 g | PACK | Freq: Every day | ORAL | Status: DC | PRN

## 2023-02-07 MED ORDER — PANTOPRAZOLE SODIUM 40 MG PO TBEC
40.0000 mg | DELAYED_RELEASE_TABLET | Freq: Every day | ORAL | Status: DC
  Administered 2023-02-08 – 2023-02-18 (×11): 40 mg via ORAL
  Filled 2023-02-07 (×11): qty 1

## 2023-02-07 MED ORDER — OLANZAPINE 10 MG IM SOLR
2.5000 mg | Freq: Once | INTRAMUSCULAR | Status: AC
  Administered 2023-02-07: 2.5 mg via INTRAVENOUS
  Filled 2023-02-07: qty 10

## 2023-02-07 MED ORDER — FAMOTIDINE 20 MG PO TABS: 20.0000 mg | ORAL_TABLET | Freq: Two times a day (BID) | ORAL | Status: DC

## 2023-02-07 MED ORDER — IPRATROPIUM-ALBUTEROL 0.5-2.5 (3) MG/3ML IN SOLN
3.0000 mL | RESPIRATORY_TRACT | Status: DC | PRN
  Administered 2023-02-08 – 2023-02-14 (×4): 3 mL via RESPIRATORY_TRACT
  Filled 2023-02-07 (×3): qty 3

## 2023-02-07 MED ORDER — OXYCODONE HCL 5 MG PO TABS
5.0000 mg | ORAL_TABLET | Freq: Four times a day (QID) | ORAL | Status: DC | PRN
  Administered 2023-02-08: 5 mg via ORAL
  Filled 2023-02-07 (×2): qty 1

## 2023-02-07 NOTE — Progress Notes (Signed)
Report of agitation, getting out of bed. Suspect sundown. Similar activity 7/23 responded well to olanzapine. Olanzapine IV x1, Soft wrist restraints ordered.

## 2023-02-07 NOTE — Plan of Care (Signed)
  Problem: Education: Goal: Ability to describe self-care measures that may prevent or decrease complications (Diabetes Survival Skills Education) will improve Outcome: Not Progressing   Problem: Coping: Goal: Ability to adjust to condition or change in health will improve Outcome: Not Progressing   Problem: Fluid Volume: Goal: Ability to maintain a balanced intake and output will improve Outcome: Progressing   Problem: Health Behavior/Discharge Planning: Goal: Ability to identify and utilize available resources and services will improve Outcome: Not Progressing Goal: Ability to manage health-related needs will improve Outcome: Not Progressing

## 2023-02-07 NOTE — Progress Notes (Signed)
NAME:  Michelle [name], MRN:  [id number], DOB:  [birth year], LOS: 1 ADMISSION DATE:  02/06/2023, CONSULTATION DATE:  02/06/23 REFERRING MD:  EDP, CHIEF COMPLAINT:  seizure   History of Present Illness:  89 yo female former smoker with a 55 pack year smoking history presented as trauma after having a seizure while utilizing the bedside toilet and hit her head. Per report pt had a seizure earlier in day as well around 2 mins long. After the seizure that resulted in fall EMS was called and prior to her leaving her residence became combative and was given versed en route. When pt arrived to ED she  was intubated and after trauma scans negative and laceration on head stapled ccm was called for admit.    Pt is unable to provide any history 2/2 intubated status and all history is obtained from chart. Of note pt is on asa and plavix  as outpt .She takes lamictal for seizure d/o per family.   Of note , she has an ovarian cyst concerning for neoplasm, and pulmonary nodules measuring up to 2 cm. Family are aware of both Followed as OP by Dr. Karel Jarvis Neuro   Pertinent  Medical History  Htn Dm2 Hyperlipidemia Dementia Cad Copd On doac 2/2 unknown reason  Significant Hospital Events: Including procedures, antibiotic start and stop dates in addition to other pertinent events   7/22: Presented as trauma, admitted 7/22 to ICU, intubated: extubated later today; started on haldol for agitation  Interim History / Subjective:  EEG yesterday w/ no seizures Off levo; bp stable Some agitation delirium yesterday requiring zyprexa and haldol  This am patient not agitated and Aox3/MAE  Objective   Blood pressure 114/83, pulse 93, temperature 100 F (37.8 C), resp. rate (!) 28, weight 79.6 kg, SpO2 100%.    Vent Mode: PSV;CPAP FiO2 (%):  [40 %] 40 % PEEP:  [5 cmH20] 5 cmH20 Pressure Support:  [5 cmH20] 5 cmH20   Intake/Output Summary (Last 24 hours) at 02/07/2023 0715 Last data filed at 02/07/2023  0600 Gross per 24 hour  Intake 1160.04 ml  Output 1625 ml  Net -464.96 ml   Filed Weights   02/06/23 0201 02/07/23 0421  Weight: 80 kg 79.6 kg    Examination: General:  elderly female in NAD HEENT: MM pink/moist Neuro: Aox3; MAE CV: s1s2, RRR, no m/r/g PULM:  dim clear BS bilaterally GI: soft, bsx4 active  Extremities: warm/dry, no edema  Skin: no rashes or lesions    Resolved Hospital Problem list     Assessment & Plan:  Seizure d/o Seizure, poa Dementia Head laceration:  Acute encephalopathy 2/2 seizure -Home Lamotrigine dose 25 mg daily, compliant Plan: -no seizure seen on EEG yesterday -Neuro and trauma following; appreciate recs -seizure precautions -AEDs per neuro; now that able to take po will likely need to be started on home lamictal -limit sedating meds -cont to hold asa and plavix -wound care for head lac per trauma -consider zyprexa overnight if patient having episodes of agitation -delirium precautions  Acute hypoxic resp failure req mechanical ventilation:  COPD: on advair and spiriva Former smoker quit 2008 with a 55 pack year smoking history Lung nodule measure up to 2 cm Plan: -extubated yesterday -pulm toiletry -PT/OT -breo and incruse ellipta; prn duoneb for wheezing -will need repeat CT in 3 months  Metabolic Acidosis Plan -LA yesterday improved from 3 to 1.8 -trend bmp  Hypotension -Very Labile BP on 7/22 2/2 agitation Plan: -telemetry monitoring -limit sedating  meds  Hx. CAD Hx. Htn Hx. Hyperlipidemia Plan -hold asa and plavix -antihypertensives on hold due to soft bp; consider resuming some home meds tomorrow if bp continues to improve -resume statin  Multi-septated cystic ovarian neoplasm  Plan: -will need to f/u outpt w/ GYN (will likely need hysterectomy)  T2DM Plan: -ssi and cbg monitoring  Depression Plan: -resume home sertraline -hodl nortriptyline    Best Practice (right click and "Reselect all  SmartList Selections" daily)   Diet/type: dysphagia diet (see orders) DVT prophylaxis: SCD GI prophylaxis: H2B Lines: N/A Foley:  N/A Code Status:  full code Last date of multidisciplinary goals of care discussion [ongoing with family]  Labs   CBC: Recent Labs  Lab 02/06/23 0141 02/06/23 0155 02/06/23 0420 02/06/23 0850 02/06/23 1405  WBC 8.3  --   --   --  12.6*  HGB 11.4* 11.6* 10.9* 10.5* 10.4*  HCT 35.8* 34.0* 32.0* 31.0* 31.0*  MCV 91.6  --   --   --  89.3  PLT 287  --   --   --  215    Basic Metabolic Panel: Recent Labs  Lab 02/06/23 0141 02/06/23 0155 02/06/23 0420 02/06/23 0850 02/06/23 1405 02/06/23 1748  NA 134* 138 138 138 135  --   K 3.3* 3.4* 3.5 3.5 4.1  --   CL 105 107  --   --  109  --   CO2 18*  --   --   --  15*  --   GLUCOSE 184* 174*  --   --  114*  --   BUN 31* 30*  --   --  21  --   CREATININE 1.06* 1.00  --   --  0.86  --   CALCIUM 9.0  --   --   --  8.5*  --   MG  --   --   --   --  1.1* 2.3  PHOS  --   --   --   --  1.8* 1.6*   GFR: Estimated Creatinine Clearance: 47.1 mL/min (by C-G formula based on SCr of 0.86 mg/dL). Recent Labs  Lab 02/06/23 0141 02/06/23 0156 02/06/23 1405  WBC 8.3  --  12.6*  LATICACIDVEN  --  3.0* 1.8    Liver Function Tests: Recent Labs  Lab 02/06/23 0141  AST 21  ALT 16  ALKPHOS 36*  BILITOT 0.5  PROT 6.7  ALBUMIN 3.3*   No results for input(s): "LIPASE", "AMYLASE" in the last 168 hours. Recent Labs  Lab 02/06/23 0314  AMMONIA <10    ABG    Component Value Date/Time   PHART 7.264 (L) 02/06/2023 0850   PCO2ART 42.9 02/06/2023 0850   PO2ART 104 02/06/2023 0850   HCO3 19.4 (L) 02/06/2023 0850   TCO2 21 (L) 02/06/2023 0850   ACIDBASEDEF 7.0 (H) 02/06/2023 0850   O2SAT 97 02/06/2023 0850     Coagulation Profile: Recent Labs  Lab 02/06/23 0141  INR 1.2    Cardiac Enzymes: No results for input(s): "CKTOTAL", "CKMB", "CKMBINDEX", "TROPONINI" in the last 168 hours.  HbA1C: Hgb  A1c MFr Bld  Date/Time Value Ref Range Status  12/19/2022 02:40 PM 5.5 4.6 - 6.5 % Final    Comment:    Glycemic Control Guidelines for People with Diabetes:Non Diabetic:  <6%Goal of Therapy: <7%Additional Action Suggested:  >8%   08/19/2022 09:12 AM 6.0 4.6 - 6.5 % Final    Comment:    Glycemic Control Guidelines for People  with Diabetes:Non Diabetic:  <6%Goal of Therapy: <7%Additional Action Suggested:  >8%     CBG: Recent Labs  Lab 02/06/23 1147 02/06/23 1601 02/06/23 1915 02/06/23 2311 02/07/23 0314  GLUCAP 116* 122* 100* 118* 113*   Allergies Allergies  Allergen Reactions   Codeine Sulfate Nausea Only   Morphine Sulfate Nausea Only     Home Medications  Prior to Admission medications   Medication Sig Start Date End Date Taking? Authorizing Provider  acetaminophen (TYLENOL) 650 MG CR tablet Take 1,300 mg by mouth 3 (three) times daily. Patient take 3 times a day.    [provider]  albuterol (PROVENTIL) (2.5 MG/3ML) 0.083% nebulizer solution USE THREE MILLILITERS VIA NEBULIZATION BY MOUTH EVERY 6 HOURS AS NEEDED FOR WHEEZING OR SHORTNESS OF BREATH 10/12/21   Shelva Majestic, MD  albuterol (VENTOLIN HFA) 108 (90 Base) MCG/ACT inhaler INHALE 1 PUFF EVERY 4 HOURS AS NEEDED FOR WHEEZING, SHORTNESS OF BREATH (RESCUE INHALER IF ADVAIR NOT WORKING) 10/13/22   Shelva Majestic, MD  Alcohol Swabs (DROPSAFE ALCOHOL PREP) 70 % PADS USE FOR TESTING THREE TIMES DAILY 11/07/22   Shelva Majestic, MD  amLODipine (NORVASC) 2.5 MG tablet Take 2.5 mg in the morning. If afternoon blood pressure >145/90 take second tablet of 2.5 06/29/22   Shelva Majestic, MD  aspirin 81 MG tablet Take 81 mg by mouth daily with breakfast. 07/02/14   [provider]  Blood Glucose Calibration (ACCU-CHEK SMARTVIEW CONTROL) LIQD  01/11/17   [provider]  blood glucose meter kit and supplies KIT Dispense based on patient and insurance preference. Use up to four times daily as  directed. Dx:E11.9 04/01/21   Shelva Majestic, MD  calcium carbonate (OSCAL) 1500 (600 Ca) MG TABS tablet Take 600 mg of elemental calcium by mouth daily with breakfast.    [provider]  carvedilol (COREG) 12.5 MG tablet Take 1 tablet (12.5 mg total) by mouth 2 (two) times daily. 12/06/22   Alver Sorrow, NP  cholecalciferol (VITAMIN D3) 25 MCG (1000 UNIT) tablet Take 1,000 Units by mouth daily with breakfast.    [provider]  clopidogrel (PLAVIX) 75 MG tablet TAKE 1 TABLET EVERY DAY 11/23/22   Shelva Majestic, MD  diclofenac Sodium (VOLTAREN) 1 % GEL APPLY 2 GRAMS TOPICALLY TO AFFECTED AREA FOUR TIMES A DAY 08/08/22   Shelva Majestic, MD  enalapril (VASOTEC) 20 MG tablet TAKE 1 TABLET TWICE DAILY 11/07/22   Shelva Majestic, MD  fenofibrate micronized (LOFIBRA) 134 MG capsule TAKE 1 CAPSULE EVERY DAY 12/27/22   Shelva Majestic, MD  ferrous sulfate 325 (65 FE) MG tablet Twice a week Patient taking differently: Take 325 mg by mouth 2 (two) times a week. 02/21/20   Shelva Majestic, MD  fluticasone-salmeterol (ADVAIR) 250-50 MCG/ACT AEPB INHALE 1 PUFF TWICE DAILY 09/12/22   Shelva Majestic, MD  folic acid (FOLVITE) 400 MCG tablet Take 400 mcg by mouth daily with breakfast.    [provider]  furosemide (LASIX) 20 MG tablet TAKE 1 TABLET EVERY DAY 05/02/22   Alver Sorrow, NP  hydrALAZINE (APRESOLINE) 50 MG tablet Take 1 tablet (50 mg total) by mouth 3 (three) times daily. 09/06/22   Chilton Si, MD  icosapent Ethyl (VASCEPA) 1 g capsule Take 2 capsules (2 g total) by mouth 2 (two) times daily. 12/06/22   Alver Sorrow, NP  insulin glargine (LANTUS SOLOSTAR) 100 UNIT/ML Solostar Pen INJECT 17 TO 20 UNITS UNDER  THE SKIN EVERY MORNING 12/08/22   Shelva Majestic, MD  Insulin Pen Needle (DROPLET PEN NEEDLES) 31G X 8 MM MISC USE AS DIRECTED WITH LANTUS SOLOSTAR PEN. Dx. E11.9 01/20/23   Shelva Majestic, MD  lamoTRIgine (LAMICTAL) 25 MG tablet TAKE 1  TABLET TWICE DAILY 12/21/22   Van Clines, MD  Lancets Misc. (ACCU-CHEK FASTCLIX LANCET) KIT 1 Device by Does not apply route. Use as directed for testing blood sugar Patient not taking: Reported on 01/20/2023    [provider]  Lancets Misc. (ACCU-CHEK FASTCLIX LANCET) KIT Use as directed to test blood sugar Patient not taking: Reported on 01/20/2023 11/13/20   Shelva Majestic, MD  loratadine (CLARITIN) 10 MG tablet Take 10 mg by mouth every other day.    [provider]  medroxyPROGESTERone (PROVERA) 10 MG tablet Take 1 tablet (10 mg total) by mouth daily. 09/12/22   Warner Mccreedy D, NP  Melatonin 5 MG CAPS Take 5 mg by mouth at bedtime.    [provider]  metFORMIN (GLUCOPHAGE-XR) 500 MG 24 hr tablet Take 1 tablet (500 mg total) by mouth daily with breakfast. 12/20/22   Shelva Majestic, MD  nitroGLYCERIN (NITROSTAT) 0.4 MG SL tablet DISSOLVE 1 TAB UNDER TONGUE FOR CHEST PAIN - IF PAIN REMAINS AFTER 5 MIN, CALL 911 AND REPEAT DOSE. MAX 3 TABS IN 15 MINUTES 01/17/23   Chilton Si, MD  nortriptyline (PAMELOR) 10 MG capsule TAKE 1 CAPSULE BY MOUTH AT BEDTIME AS NEEDED FOR PAIN PREVENTION 01/30/23   Rodolph Bong, MD  pantoprazole (PROTONIX) 40 MG tablet TAKE 1 TABLET EVERY DAY 07/13/22   Shelva Majestic, MD  psyllium (METAMUCIL) 58.6 % packet Take 1 packet by mouth daily.    [provider]  rosuvastatin (CRESTOR) 40 MG tablet TAKE 1 TABLET EVERY DAY 08/08/22   Shelva Majestic, MD  sertraline (ZOLOFT) 50 MG tablet TAKE 2 TABLETS AT BEDTIME 10/13/22   Shelva Majestic, MD  SPIRIVA HANDIHALER 18 MCG inhalation capsule PLACE 1 CAPSULE INTO HANDIHALER AND INHALE THE CONTENTS DAILY 11/23/22   Shelva Majestic, MD  spironolactone (ALDACTONE) 25 MG tablet TAKE 1 TABLET EVERY DAY 08/22/22   Shelva Majestic, MD  TRUE METRIX BLOOD GLUCOSE TEST test strip TEST BLOOD SUGAR UP TO FOUR TIMES DAILY AS DIRECTED 06/29/22   Shelva Majestic, MD  TRUEplus Lancets 33G MISC  TEST BLOOD SUGAR UP TO FOUR TIMES DAILY AS DIRECTED 06/29/22   Shelva Majestic, MD  vitamin B-12 (CYANOCOBALAMIN) 1000 MCG tablet Take 1,000 mcg by mouth daily with breakfast.    [provider]  vitamin E 400 UNIT capsule Take 400 Units by mouth at bedtime.    [provider]         JD Anselm Lis [facility] 02/07/2023, 8:26 AM  Please see [email or web address] for pager details.  From 7A-7P if no response, please call [phone]. After hours, please call ELink [phone].

## 2023-02-07 NOTE — Consult Note (Signed)
Physical Medicine and Rehabilitation Consult Reason for Consult:impaired cognition and functional mobility after fall Referring Physician: Hunsucker   HPI: Michelle [name] is a 89 y.o. female with hx of tobacco use, hx of seizures, DM, COPD, CAD, dementia who suffered a seizure yesterday and fell striking her head on a bedside toilet. Pt was combative at scene and was taken to ED where she was intubated.  CT of the head and c-spine did not reveal any acute changes. EEG demonstrated generalized slowing. Head laceration was stapled. Neurology recommended holding home lamictal until she's able to take PO.  Pt remains very confused. She did move with PT this morning and was min assist for sit-std transfers and mod assist for std-pivot transfers. Pt did not ambulate d/t confusion.   Review of Systems  Unable to perform ROS: Mental acuity   Past Medical History:  Diagnosis Date   Anginal pain (HCC)    Arthritis    AV block, 1st degree    CAD (coronary artery disease) 1990; 2015   Cardiac cath 1990 with Dr. Riley Kill and [name] Residual CAD, non obstructive per 2015 cath. STEMI status post stent in August 2015.   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   Promus DES to RCA with STEMI   Shortness of breath    Past Surgical History:  Procedure Laterality Date   CATARACT EXTRACTION  2020   right and left eye    CHOLECYSTECTOMY     thinks her appendix was removed at the same time   CORONARY ANGIOPLASTY WITH STENT PLACEMENT   02/18/14   Promus DES to RCA   LEFT HEART CATHETERIZATION WITH CORONARY ANGIOGRAM N/A 02/18/2014   Procedure: LEFT HEART CATHETERIZATION WITH CORONARY ANGIOGRAM;  Surgeon: Corky Crafts, MD;  Location: [facility] CATH LAB;  Service: Cardiovascular;  Laterality: N/A;   PTCA  1990   PTCA of RCA   TRANSTHORACIC ECHOCARDIOGRAM  02/02/2012   mild LVH, EF 55-60%, Normal WM, Gr 1 DD; Mild MR   Family History  Problem Relation Age of Onset   Heart attack Mother 57   Cancer Father 34   Cancer Maternal Grandfather    Cancer Paternal Grandmother    Cancer Paternal Grandfather    Diabetes Son    Liver cancer Brother    Bone cancer Brother    Heart attack Son    Hypertension Son    Diabetes Son    Hyperlipidemia Son    Colon cancer Neg Hx    Ovarian cancer Neg Hx    Uterine cancer Neg Hx    Social History:  reports that she quit smoking about 16 years ago. Her smoking use included cigarettes. She started smoking about 71 years ago. She has a 55 pack-year smoking history. She has never used smokeless tobacco. She reports that she does not drink alcohol and does not use drugs. Allergies:  Allergies  Allergen Reactions   Codeine Sulfate Nausea Only   Morphine Sulfate Nausea Only   Medications Prior  to Admission  Medication Sig Dispense Refill   acetaminophen (TYLENOL) 650 MG CR tablet Take 1,300 mg by mouth 3 (three) times daily.     albuterol (PROVENTIL) (2.5 MG/3ML) 0.083% nebulizer solution USE THREE MILLILITERS VIA NEBULIZATION BY MOUTH EVERY 6 HOURS AS NEEDED FOR WHEEZING OR SHORTNESS OF BREATH (Patient taking differently: Take 2.5 mg by nebulization every 6 (six) hours as needed for wheezing or shortness of breath.) 75 mL 3   albuterol (VENTOLIN HFA) 108 (90 Base) MCG/ACT inhaler INHALE 1 PUFF EVERY 4 HOURS AS NEEDED FOR WHEEZING, SHORTNESS OF BREATH (RESCUE INHALER IF ADVAIR NOT WORKING) (Patient taking differently: Inhale 1 puff into the lungs every 4 (four) hours as needed for wheezing or  shortness of breath.) 2 each 2   amLODipine (NORVASC) 2.5 MG tablet Take 2.5 mg in the morning. If afternoon blood pressure >145/90 take second tablet of 2.5 180 tablet 3   aspirin 81 MG tablet Take 81 mg by mouth daily with breakfast.     calcium carbonate (OSCAL) 1500 (600 Ca) MG TABS tablet Take 600 mg of elemental calcium by mouth daily with breakfast.     carvedilol (COREG) 12.5 MG tablet Take 1 tablet (12.5 mg total) by mouth 2 (two) times daily. 180 tablet 3   cholecalciferol (VITAMIN D3) 25 MCG (1000 UNIT) tablet Take 1,000 Units by mouth daily with breakfast.     clopidogrel (PLAVIX) 75 MG tablet TAKE 1 TABLET EVERY DAY (Patient taking differently: Take 75 mg by mouth daily.) 90 tablet 3   diclofenac Sodium (VOLTAREN) 1 % GEL APPLY 2 GRAMS TOPICALLY TO AFFECTED AREA FOUR TIMES A DAY 300 g 3   enalapril (VASOTEC) 20 MG tablet TAKE 1 TABLET TWICE DAILY (Patient taking differently: Take 20 mg by mouth 2 (two) times daily.) 180 tablet 3   fenofibrate micronized (LOFIBRA) 134 MG capsule TAKE 1 CAPSULE EVERY DAY 90 capsule 3   ferrous sulfate 325 (65 FE) MG tablet Twice a week (Patient taking differently: Take 325 mg by mouth 2 (two) times a week. Tuesday and Friday) 18 tablet 3   fluticasone-salmeterol (ADVAIR) 250-50 MCG/ACT AEPB INHALE 1 PUFF TWICE DAILY 180 each 3   folic acid (FOLVITE) 400 MCG tablet Take 400 mcg by mouth daily with breakfast.     furosemide (LASIX) 20 MG tablet TAKE 1 TABLET EVERY DAY (Patient taking differently: Take 20 mg by mouth daily.) 90 tablet 3   hydrALAZINE (APRESOLINE) 50 MG tablet Take 1 tablet (50 mg total) by mouth 3 (three) times daily. 270 tablet 3   icosapent Ethyl (VASCEPA) 1 g capsule Take 2 capsules (2 g total) by mouth 2 (two) times daily. 360 capsule 3   insulin glargine (LANTUS SOLOSTAR) 100 UNIT/ML Solostar Pen INJECT 17 TO 20 UNITS UNDER THE SKIN EVERY MORNING (Patient taking differently: Inject 20 Units into the skin every morning.) 30 mL 3    lamoTRIgine (LAMICTAL) 25 MG tablet TAKE 1 TABLET TWICE DAILY 180 tablet 0   loratadine (CLARITIN) 10 MG tablet Take 10 mg by mouth every other day.     medroxyPROGESTERone (PROVERA) 10 MG tablet Take 1 tablet (10 mg total) by mouth daily. 30 tablet 3   Melatonin 5 MG CAPS Take 5 mg by mouth at bedtime.     metFORMIN (GLUCOPHAGE-XR) 500 MG 24 hr tablet Take 1 tablet (500 mg total) by mouth daily with breakfast. 90 tablet 3   nortriptyline (PAMELOR) 10 MG capsule TAKE 1 CAPSULE BY MOUTH AT BEDTIME AS NEEDED  FOR PAIN PREVENTION (Patient taking differently: Take 10 mg by mouth at bedtime as needed for sleep.) 30 capsule 1   pantoprazole (PROTONIX) 40 MG tablet TAKE 1 TABLET EVERY DAY 90 tablet 3   psyllium (METAMUCIL) 58.6 % packet Take 1 packet by mouth daily.     rosuvastatin (CRESTOR) 40 MG tablet TAKE 1 TABLET EVERY DAY 90 tablet 3   sertraline (ZOLOFT) 50 MG tablet TAKE 2 TABLETS AT BEDTIME (Patient taking differently: Take 100 mg by mouth at bedtime.) 180 tablet 3   SPIRIVA HANDIHALER 18 MCG inhalation capsule PLACE 1 CAPSULE INTO HANDIHALER AND INHALE THE CONTENTS DAILY (Patient taking differently: Place 18 mcg into inhaler and inhale daily.) 90 capsule 3   spironolactone (ALDACTONE) 25 MG tablet TAKE 1 TABLET EVERY DAY 90 tablet 3   vitamin B-12 (CYANOCOBALAMIN) 1000 MCG tablet Take 1,000 mcg by mouth daily with breakfast.     vitamin E 400 UNIT capsule Take 400 Units by mouth at bedtime.     Alcohol Swabs (DROPSAFE ALCOHOL PREP) 70 % PADS USE FOR TESTING THREE TIMES DAILY 300 each 3   blood glucose meter kit and supplies KIT Dispense based on patient and insurance preference. Use up to four times daily as directed. Dx:E11.9 1 each 3   Insulin Pen Needle (DROPLET PEN NEEDLES) 31G X 8 MM MISC USE AS DIRECTED WITH LANTUS SOLOSTAR PEN. Dx. E11.9 100 each 4   Lancets Misc. (ACCU-CHEK FASTCLIX LANCET) KIT 1 Device by Does not apply route. Use as directed for testing blood sugar (Patient not taking:  Reported on 01/20/2023)     Lancets Misc. (ACCU-CHEK FASTCLIX LANCET) KIT Use as directed to test blood sugar (Patient not taking: Reported on 01/20/2023) 1 kit 1   nitroGLYCERIN (NITROSTAT) 0.4 MG SL tablet DISSOLVE 1 TAB UNDER TONGUE FOR CHEST PAIN - IF PAIN REMAINS AFTER 5 MIN, CALL 911 AND REPEAT DOSE. MAX 3 TABS IN 15 MINUTES (Patient taking differently: Place 0.4 mg under the tongue every 5 (five) minutes as needed for chest pain. DISSOLVE 1 TAB UNDER TONGUE FOR CHEST PAIN - IF PAIN REMAINS AFTER 5 MIN, CALL 911 AND REPEAT DOSE. MAX 3 TABS IN 15 MINUTES) 25 tablet 6   TRUE METRIX BLOOD GLUCOSE TEST test strip TEST BLOOD SUGAR UP TO FOUR TIMES DAILY AS DIRECTED 400 strip 3   TRUEplus Lancets 33G MISC TEST BLOOD SUGAR UP TO FOUR TIMES DAILY AS DIRECTED 400 each 3    Home: Home Living Family/patient expects to be discharged to:: Private residence Living Arrangements: Children Available Help at Discharge: Family, Available PRN/intermittently Type of Home: House Home Access: Stairs to enter Secretary/administrator of Steps: 4 Entrance Stairs-Rails: Right Home Layout: One level Home Equipment: Rollator (4 wheels) Additional Comments: son present initially during session but PT unable to obtain history prior to son's departure. Pt is unable to provide a reliable history due to AMS. History taken from admission in 2022, may be inaccurate at this time  Functional History: Prior Function Prior Level of Function : Patient poor historian/Family not available Mobility Comments: notes indicate the pt fell when transferring to a bedside commode, pt unable to provide a reliable history at this time Functional Status:  Mobility: Bed Mobility Overal bed mobility: Needs Assistance Bed Mobility: Supine to Sit Supine to sit: Mod assist Sit to supine: Total assist General bed mobility comments: Pt able to initiate, modA at trunk and assist for scooting out to edge of bed Transfers Overall transfer level:  Needs  assistance Equipment used: Rolling walker (2 wheels), 2 person hand held assist Transfers: Sit to/from Stand, Bed to chair/wheelchair/BSC Sit to Stand: Min assist, +2 physical assistance Bed to/from chair/wheelchair/BSC transfer type:: Stand pivot Stand pivot transfers: Mod assist, +2 physical assistance General transfer comment: Initially stood up to RW with minA + 2, cues provided for proper hand placement. Pt with difficulty sequencing and keeping L foot elevated off of ground. Returned to sitting and performed face to face transfer from bed to chair with modA + 2 Ambulation/Gait General Gait Details: deferred due to cognition Pre-gait activities: maxA to take one side step to R side at edge of bed    ADL:    Cognition: Cognition Overall Cognitive Status: Impaired/Different from baseline Orientation Level: Oriented to person, Oriented to place, Disoriented to time, Disoriented to situation Cognition Arousal/Alertness: Awake/alert Behavior During Therapy: Restless Overall Cognitive Status: Impaired/Different from baseline Area of Impairment: Orientation, Attention, Memory, Following commands, Safety/judgement, Awareness, Problem solving Orientation Level: Disoriented to, Place, Time, Situation Current Attention Level: Sustained, Focused Memory: Decreased short-term memory Following Commands: Follows one step commands with increased time Safety/Judgement: Decreased awareness of safety, Decreased awareness of deficits Awareness: Intellectual Problem Solving: Difficulty sequencing, Requires verbal cues, Requires tactile cues General Comments: Pt A&O to self, needs cues for recalling birthday and place (initially stating she was at [facility]). Pt very restless, with difficulty problem solving and sequencing throughout session. Responds well to son and making a joke about not telling her age.  Blood pressure (!) 103/90, pulse 97, temperature 99.7 F (37.6 C), resp. rate (!) 23, weight  79.6 kg, SpO2 91%. Physical Exam Constitutional:      Appearance: She is obese.     Comments: Bilateral mittens  HENT:     Head:     Comments: Wounds over crown and occiput, dry with scab/staples    Right Ear: External ear normal.     Left Ear: External ear normal.     Mouth/Throat:     Pharynx: Oropharynx is clear.  Eyes:     Pupils: Pupils are equal, round, and reactive to light.  Cardiovascular:     Rate and Rhythm: Normal rate.  Pulmonary:     Effort: Pulmonary effort is normal.  Abdominal:     Palpations: Abdomen is soft.  Musculoskeletal:     Cervical back: Normal range of motion.  Skin:    Comments: Scattered bruises and lacs on limbs, especially LLE. Scalp documented above  Neurological:     Mental Status: She is alert.     Comments: Pt fairly alert. Oriented to self, said she was in hospital in [place]. When I corrected her she said "oh yeah I live in [place]". Could not tell me why she was in hospital. Thought she had seen me before or that I had treated a friend. Speech a little slurred. Visual fields grossly in tact. Moves all 4's but with decreased coordination. Motor 4/4 UE and LE's 2+HF, 3+KE and 4/5 ADF/PF. No gross sensory abnl. No abnl resting tone.  Psychiatric:     Comments: Pt pleasantly confused     Results for orders placed or performed during the hospital encounter of 02/06/23 (from the past 24 hour(s))  Glucose, capillary     Status: Abnormal   Collection Time: 02/06/23 11:47 AM  Result Value Ref Range   Glucose-Capillary 116 (H) 70 - 99 mg/dL  Basic metabolic panel     Status: Abnormal   Collection Time: 02/06/23  2:05 PM  Result  Value Ref Range   Sodium 135 135 - 145 mmol/L   Potassium 4.1 3.5 - 5.1 mmol/L   Chloride 109 98 - 111 mmol/L   CO2 15 (L) 22 - 32 mmol/L   Glucose, Bld 114 (H) 70 - 99 mg/dL   BUN 21 8 - 23 mg/dL   Creatinine, Ser 1.91 0.44 - 1.00 mg/dL   Calcium 8.5 (L) 8.9 - 10.3 mg/dL   GFR, Estimated >47 >82 mL/min   Anion gap 11 5  - 15  CBC     Status: Abnormal   Collection Time: 02/06/23  2:05 PM  Result Value Ref Range   WBC 12.6 (H) 4.0 - 10.5 K/uL   RBC 3.47 (L) 3.87 - 5.11 MIL/uL   Hemoglobin 10.4 (L) 12.0 - 15.0 g/dL   HCT 95.6 (L) 21.3 - 08.6 %   MCV 89.3 80.0 - 100.0 fL   MCH 30.0 26.0 - 34.0 pg   MCHC 33.5 30.0 - 36.0 g/dL   RDW 57.8 46.9 - 62.9 %   Platelets 215 150 - 400 K/uL   nRBC 0.0 0.0 - 0.2 %  Lactic acid, plasma     Status: None   Collection Time: 02/06/23  2:05 PM  Result Value Ref Range   Lactic Acid, Venous 1.8 0.5 - 1.9 mmol/L  Magnesium     Status: Abnormal   Collection Time: 02/06/23  2:05 PM  Result Value Ref Range   Magnesium 1.1 (L) 1.7 - 2.4 mg/dL  Phosphorus     Status: Abnormal   Collection Time: 02/06/23  2:05 PM  Result Value Ref Range   Phosphorus 1.8 (L) 2.5 - 4.6 mg/dL  Glucose, capillary     Status: Abnormal   Collection Time: 02/06/23  4:01 PM  Result Value Ref Range   Glucose-Capillary 122 (H) 70 - 99 mg/dL  Magnesium     Status: None   Collection Time: 02/06/23  5:48 PM  Result Value Ref Range   Magnesium 2.3 1.7 - 2.4 mg/dL  Phosphorus     Status: Abnormal   Collection Time: 02/06/23  5:48 PM  Result Value Ref Range   Phosphorus 1.6 (L) 2.5 - 4.6 mg/dL  Glucose, capillary     Status: Abnormal   Collection Time: 02/06/23  7:15 PM  Result Value Ref Range   Glucose-Capillary 100 (H) 70 - 99 mg/dL  Glucose, capillary     Status: Abnormal   Collection Time: 02/06/23 11:11 PM  Result Value Ref Range   Glucose-Capillary 118 (H) 70 - 99 mg/dL  Glucose, capillary     Status: Abnormal   Collection Time: 02/07/23  3:14 AM  Result Value Ref Range   Glucose-Capillary 113 (H) 70 - 99 mg/dL  Triglycerides     Status: Abnormal   Collection Time: 02/07/23  7:08 AM  Result Value Ref Range   Triglycerides 239 (H) <150 mg/dL  Magnesium     Status: None   Collection Time: 02/07/23  7:08 AM  Result Value Ref Range   Magnesium 2.2 1.7 - 2.4 mg/dL  Phosphorus      Status: None   Collection Time: 02/07/23  7:08 AM  Result Value Ref Range   Phosphorus 3.0 2.5 - 4.6 mg/dL  CBC with Differential/Platelet     Status: Abnormal   Collection Time: 02/07/23  7:08 AM  Result Value Ref Range   WBC 7.3 4.0 - 10.5 K/uL   RBC 3.20 (L) 3.87 - 5.11 MIL/uL   Hemoglobin 9.5 (L)  12.0 - 15.0 g/dL   HCT 36.6 (L) 44.0 - 34.7 %   MCV 90.0 80.0 - 100.0 fL   MCH 29.7 26.0 - 34.0 pg   MCHC 33.0 30.0 - 36.0 g/dL   RDW 42.5 95.6 - 38.7 %   Platelets 226 150 - 400 K/uL   nRBC 0.0 0.0 - 0.2 %   Neutrophils Relative % 67 %   Neutro Abs 4.9 1.7 - 7.7 K/uL   Lymphocytes Relative 21 %   Lymphs Abs 1.5 0.7 - 4.0 K/uL   Monocytes Relative 10 %   Monocytes Absolute 0.7 0.1 - 1.0 K/uL   Eosinophils Relative 1 %   Eosinophils Absolute 0.1 0.0 - 0.5 K/uL   Basophils Relative 1 %   Basophils Absolute 0.0 0.0 - 0.1 K/uL   Immature Granulocytes 0 %   Abs Immature Granulocytes 0.03 0.00 - 0.07 K/uL  Comprehensive metabolic panel     Status: Abnormal   Collection Time: 02/07/23  7:08 AM  Result Value Ref Range   Sodium 137 135 - 145 mmol/L   Potassium 3.6 3.5 - 5.1 mmol/L   Chloride 110 98 - 111 mmol/L   CO2 19 (L) 22 - 32 mmol/L   Glucose, Bld 100 (H) 70 - 99 mg/dL   BUN 15 8 - 23 mg/dL   Creatinine, Ser 5.64 0.44 - 1.00 mg/dL   Calcium 8.3 (L) 8.9 - 10.3 mg/dL   Total Protein 5.9 (L) 6.5 - 8.1 g/dL   Albumin 3.1 (L) 3.5 - 5.0 g/dL   AST 25 15 - 41 U/L   ALT 16 0 - 44 U/L   Alkaline Phosphatase 27 (L) 38 - 126 U/L   Total Bilirubin 0.4 0.3 - 1.2 mg/dL   GFR, Estimated >33 >29 mL/min   Anion gap 8 5 - 15  Glucose, capillary     Status: Abnormal   Collection Time: 02/07/23  8:09 AM  Result Value Ref Range   Glucose-Capillary 104 (H) 70 - 99 mg/dL   *Note: Due to a large number of results and/or encounters for the requested time period, some results have not been displayed. A complete set of results can be found in Results Review.   DG Shoulder Left Port  Result  Date: 02/06/2023 CLINICAL DATA:  Bruising EXAM: LEFT SHOULDER COMPARISON:  None Available. FINDINGS: No evidence of fracture or dislocation. There is no evidence of arthropathy or other focal bone abnormality. Partially visualized ETT and NG/OG tube. Soft tissues are unremarkable. IMPRESSION: No acute osseous abnormality. Electronically Signed   By: Allegra Lai M.D.   On: 02/06/2023 13:45   EEG adult  Result Date: 02/06/2023 Charlsie Quest, MD     02/06/2023  9:59 AM Patient Name: Michelle [name] MRN: [id number] Epilepsy Attending: Charlsie Quest Referring Physician/Provider: Briant Sites, DO Date: 02/06/2023 Duration: 23.55 mins Patient history: 89yo F with h/o seizures now with ams. EG to evaluate for seizure Level of alertness: lethargic, sleep AEDs during EEG study: LTG Technical aspects: This EEG study was done with scalp electrodes positioned according to the 10-20 International system of electrode placement. Electrical activity was reviewed with band pass filter of 1-70Hz , sensitivity of 7 uV/mm, display speed of 26mm/sec with a 60Hz  notched filter applied as appropriate. EEG data were recorded continuously and digitally stored.  Video monitoring was available and reviewed as appropriate. Description: EEG showed continuous/intermittent generalized polymorphic sharply contoured 3 to 6 Hz theta-delta slowing. Sleep was characterized by vertex waves, sleep spindles (  12 to 14 Hz), maximal frontocentral region. Hyperventilation and photic stimulation were not performed.   ABNORMALITY - Continuous slow, generalized IMPRESSION: This study is suggestive of moderate to severe diffuse encephalopathy, nonspecific etiology. No seizures or epileptiform discharges were seen throughout the recording. Charlsie Quest   DG Knee 1-2 Views Left  Result Date: 02/06/2023 CLINICAL DATA:  Fall EXAM: LEFT KNEE - 1-2 VIEW COMPARISON:  None Available. FINDINGS: Advanced tricompartment degenerative changes with  joint space narrowing and spurring. No joint effusion. No acute bony abnormality. Specifically, no fracture, subluxation, or dislocation. Soft tissues are intact. Vascular calcifications noted. IMPRESSION: Advanced tricompartment degenerative changes. No acute bony abnormality. Electronically Signed   By: Charlett Nose M.D.   On: 02/06/2023 03:05   DG FEMUR MIN 2 VIEWS LEFT  Result Date: 02/06/2023 CLINICAL DATA:  Fall EXAM: LEFT FEMUR 2 VIEWS COMPARISON:  None Available. FINDINGS: No acute bony abnormality. Specifically, no fracture, subluxation, or dislocation. Advanced tricompartment degenerative changes in the left knee. No joint effusion. Soft tissues are intact. IMPRESSION: No acute bony abnormality. Electronically Signed   By: Charlett Nose M.D.   On: 02/06/2023 03:05   DG Tibia/Fibula Left  Result Date: 02/06/2023 CLINICAL DATA:  Fall EXAM: LEFT TIBIA AND FIBULA - 2 VIEW COMPARISON:  None Available. FINDINGS: No acute bony abnormality. Specifically, no fracture, subluxation, or dislocation. Advanced tricompartment degenerative changes in the left knee. Soft tissues are intact. Mild anterior soft tissue swelling over the proximal tibia. IMPRESSION: No acute bony abnormality. Electronically Signed   By: Charlett Nose M.D.   On: 02/06/2023 03:04   CT CERVICAL SPINE WO CONTRAST  Result Date: 02/06/2023 CLINICAL DATA:  Trauma. EXAM: CT HEAD WITHOUT CONTRAST CT CERVICAL SPINE WITHOUT CONTRAST TECHNIQUE: Multidetector CT imaging of the head and cervical spine was performed following the standard protocol without intravenous contrast. Multiplanar CT image reconstructions of the cervical spine were also generated. RADIATION DOSE REDUCTION: This exam was performed according to the departmental dose-optimization program which includes automated exposure control, adjustment of the mA and/or kV according to patient size and/or use of iterative reconstruction technique. COMPARISON:  None Available. FINDINGS: CT  HEAD FINDINGS Brain: Mild age-related atrophy and moderate chronic microvascular ischemic changes. There is no acute intracranial hemorrhage. No mass effect or midline shift. No extra-axial fluid collection. Vascular: No hyperdense vessel or unexpected calcification. Skull: Normal. Negative for fracture or focal lesion. Sinuses/Orbits: There is mucoperiosteal thickening the ethmoid air cells. The maxillary sinuses and mastoid air cells are clear. No air-fluid level. Other: Laceration of the scalp over the vertex with multiple skin staples. CT CERVICAL SPINE FINDINGS Alignment: No acute subluxation.  Grade 1 C6-C7 anterolisthesis. Skull base and vertebrae: No acute fracture.  Osteopenia. Soft tissues and spinal canal: No prevertebral fluid or swelling. No visible canal hematoma. Disc levels:  No acute findings.  Degenerative changes. Upper chest: Biapical subpleural scarring. Other: An endotracheal and enteric tubes are partially visualized. IMPRESSION: 1. No acute intracranial pathology. Mild age-related atrophy and moderate chronic microvascular ischemic changes. 2. No acute/traumatic cervical spine pathology. Electronically Signed   By: Elgie Collard M.D.   On: 02/06/2023 02:52   CT HEAD WO CONTRAST  Result Date: 02/06/2023 CLINICAL DATA:  Trauma. EXAM: CT HEAD WITHOUT CONTRAST CT CERVICAL SPINE WITHOUT CONTRAST TECHNIQUE: Multidetector CT imaging of the head and cervical spine was performed following the standard protocol without intravenous contrast. Multiplanar CT image reconstructions of the cervical spine were also generated. RADIATION DOSE REDUCTION: This exam was performed  according to the departmental dose-optimization program which includes automated exposure control, adjustment of the mA and/or kV according to patient size and/or use of iterative reconstruction technique. COMPARISON:  None Available. FINDINGS: CT HEAD FINDINGS Brain: Mild age-related atrophy and moderate chronic microvascular  ischemic changes. There is no acute intracranial hemorrhage. No mass effect or midline shift. No extra-axial fluid collection. Vascular: No hyperdense vessel or unexpected calcification. Skull: Normal. Negative for fracture or focal lesion. Sinuses/Orbits: There is mucoperiosteal thickening the ethmoid air cells. The maxillary sinuses and mastoid air cells are clear. No air-fluid level. Other: Laceration of the scalp over the vertex with multiple skin staples. CT CERVICAL SPINE FINDINGS Alignment: No acute subluxation.  Grade 1 C6-C7 anterolisthesis. Skull base and vertebrae: No acute fracture.  Osteopenia. Soft tissues and spinal canal: No prevertebral fluid or swelling. No visible canal hematoma. Disc levels:  No acute findings.  Degenerative changes. Upper chest: Biapical subpleural scarring. Other: An endotracheal and enteric tubes are partially visualized. IMPRESSION: 1. No acute intracranial pathology. Mild age-related atrophy and moderate chronic microvascular ischemic changes. 2. No acute/traumatic cervical spine pathology. Electronically Signed   By: Elgie Collard M.D.   On: 02/06/2023 02:52   CT CHEST ABDOMEN PELVIS W CONTRAST  Result Date: 02/06/2023 CLINICAL DATA:  Seizure and fall. EXAM: CT CHEST, ABDOMEN, AND PELVIS WITH CONTRAST TECHNIQUE: Multidetector CT imaging of the chest, abdomen and pelvis was performed following the standard protocol during bolus administration of intravenous contrast. RADIATION DOSE REDUCTION: This exam was performed according to the departmental dose-optimization program which includes automated exposure control, adjustment of the mA and/or kV according to patient size and/or use of iterative reconstruction technique. CONTRAST:  75mL OMNIPAQUE IOHEXOL 350 MG/ML SOLN COMPARISON:  Chest radiograph dated 02/06/2023. FINDINGS: Evaluation of this exam is limited in the absence of intravenous contrast. CT CHEST FINDINGS Cardiovascular: There is no cardiomegaly. Three vessel  coronary vascular calcification. Trace pericardial effusion. There is moderate atherosclerotic calcification of the thoracic aorta. No aneurysmal dilatation or dissection. The central pulmonary arteries appear patent. Mediastinum/Nodes: No hilar or mediastinal adenopathy. An enteric tube is noted in the esophagus. Subcentimeter left thyroid hypodense nodule. Not clinically significant; no follow-up imaging recommended (ref: J Am Coll Radiol. 2015 Feb;12(2): 143-50).No mediastinal fluid collection. Lungs/Pleura: Right upper and right lower lobe subpleural atelectasis versus less likely contusion. There is a 2 cm focus of ground-glass nodularity in the superior segment of the left lower lobe. Additional 1.8 cm nodule in the lingula versus scarring. There is no pleural effusion or pneumothorax. Endotracheal tube with tip at the level of the carina. Recommend retraction by 3 cm. The central airways are patent. Musculoskeletal: Degenerative changes of the spine. No acute osseous pathology. CT ABDOMEN PELVIS FINDINGS No intra-abdominal free air or free fluid. Hepatobiliary: The liver is unremarkable. There is biliary dilatation, post cholecystectomy. No retained calcified stone noted in the central CBD. Pancreas: Unremarkable. No pancreatic ductal dilatation or surrounding inflammatory changes. Spleen: Normal in size without focal abnormality. Adrenals/Urinary Tract: The adrenal glands are unremarkable. There is no hydronephrosis on either side. Several subcentimeter bilateral renal hypodense lesions are too small to characterize. The visualized ureters and urinary bladder appear unremarkable. Stomach/Bowel: Enteric tube with tip in the body of the stomach. There is no bowel obstruction or active inflammation. The appendix is not visualized with certainty. No inflammatory changes identified in the right lower quadrant. Vascular/Lymphatic: Advanced aortoiliac atherosclerotic disease. The IVC is unremarkable. No portal  venous gas. There is no adenopathy. Reproductive: The  uterus is anteverted. There is a small calcified uterine fibroid. A 12 x 16 x 17 cm cystic lesion with internal septation in the pelvis most consistent with and ovarian neoplasm. Other: None Musculoskeletal: Osteopenia with degenerative changes of the spine. No acute osseous pathology. IMPRESSION: 1. No acute/traumatic intrathoracic, abdominal, or pelvic pathology. 2. Endotracheal tube with tip at the level of the carina. Recommend retraction by 3 cm. 3. Multi-septated cystic ovarian neoplasm. Gynecology referral and further evaluation with MRI without and with contrast on a nonemergent/outpatient basis recommended. 4. Pulmonary nodules as above measure up to 2 cm. Clinical correlation and follow-up with CT in 3 months recommended. 5. Right lung subpleural atelectasis versus less likely contusion. 6.  Aortic Atherosclerosis (ICD10-I70.0). Electronically Signed   By: Elgie Collard M.D.   On: 02/06/2023 02:52   DG Pelvis Portable  Result Date: 02/06/2023 CLINICAL DATA:  Trauma, fall EXAM: PORTABLE PELVIS 1-2 VIEWS COMPARISON:  None Available. FINDINGS: There is no evidence of pelvic fracture or diastasis. No pelvic bone lesions are seen. Hip joints and SI joints symmetric. IMPRESSION: No acute bony abnormality. Electronically Signed   By: Charlett Nose M.D.   On: 02/06/2023 02:11   DG Chest Port 1 View  Result Date: 02/06/2023 CLINICAL DATA:  Trauma, fall EXAM: PORTABLE CHEST 1 VIEW COMPARISON:  01/16/2023 FINDINGS: NG tube enters the stomach. Endotracheal tube is 1.3 cm above the carina. Heart and mediastinal contours are within normal limits. Aortic atherosclerosis. No confluent airspace opacities or effusions. No acute bony abnormality. IMPRESSION: No acute cardiopulmonary disease. Endotracheal tube 1.3 cm above the carina. Electronically Signed   By: Charlett Nose M.D.   On: 02/06/2023 02:10    Assessment/Plan: Diagnosis: 89 yo female with ground  level fall, striking head against toilet due to seizure vs. Pt with persistent confusion since fall d/t TBI in the setting of her dementia, sz disorder. Does the need for close, 24 hr/day medical supervision in concert with the patient's rehab needs make it unreasonable for this patient to be served in a less intensive setting? Yes and Potentially Co-Morbidities requiring supervision/potential complications:  -seizure disorder -hx of dementia -CAD -COPD Due to bladder management, bowel management, safety, skin/wound care, disease management, medication administration, pain management, and patient education, does the patient require 24 hr/day rehab nursing? Yes Does the patient require coordinated care of a physician, rehab nurse, therapy disciplines of PT, OT, SLP to address physical and functional deficits in the context of the above medical diagnosis(es)? Yes Addressing deficits in the following areas: balance, endurance, locomotion, strength, transferring, bowel/bladder control, bathing, dressing, feeding, grooming, toileting, cognition, speech, and psychosocial support Can the patient actively participate in an intensive therapy program of at least 3 hrs of therapy per day at least 5 days per week? Yes The potential for patient to make measurable gains while on inpatient rehab is excellent Anticipated functional outcomes upon discharge from inpatient rehab are supervision  with PT, supervision with OT, supervision with SLP. Estimated rehab length of stay to reach the above functional goals is: 7-10 days Anticipated discharge destination: Home Overall Rehab/Functional Prognosis: good  POST ACUTE RECOMMENDATIONS: This patient's condition is appropriate for continued rehabilitative care in the following setting: CIR Patient has agreed to participate in recommended program. N/A Note that insurance prior authorization may be required for reimbursement for recommended care.  Comment: Son at  bedside. Tells me brother can provide supervision at home. Pt's current cognitive and mobility status is a big departure from before where pt  was mod I in the home. Rehab Admissions Coordinator to follow up    MEDICAL RECOMMENDATIONS: Normalize sleep-wake as possible   I have personally performed a face to face diagnostic evaluation of this patient. Additionally, I have examined the patient's medical record including any pertinent labs and radiographic images. If the physician assistant has documented in this note, I have reviewed and edited or otherwise concur with the physician assistant's documentation.  Thanks,  Ranelle Oyster, MD 02/07/2023

## 2023-02-07 NOTE — Progress Notes (Signed)
Physical Therapy Treatment Patient Details Name: Michelle [name] MRN: [id number] DOB: [birth year] Today's Date: 02/07/2023   History of Present Illness 89 y.o. female [facility] hospital on 02/06/2023 after having a seizure, falling and striking her head. Pt intubated in the ED, extubated same date. PMH includes HTN, seizures, DMII, HLD, dementia, CAD, COPD.    PT Comments  Pt restless and fidgety, however, is overall pleasant and able to participate in session. Pt son present to confirm that cognition is acutely impaired in comparison to baseline; pt A&O to self only, whereas pt son states pt would typically be oriented. Pt requiring two person min-mod assist for functional mobility. Transferring to chair via face to face method, due to difficulty sequencing with RW. Pt demonstrates weakness,  impaired balance, cognition, and coordination. Patient will benefit from intensive inpatient follow up therapy, >3 hours/day.     Assistance Recommended at Discharge Frequent or constant Supervision/Assistance  If plan is discharge home, recommend the following:  Can travel by private vehicle    Two people to help with walking and/or transfers;Assistance with cooking/housework;Two people to help with bathing/dressing/bathroom;Direct supervision/assist for medications management;Direct supervision/assist for financial management;Assist for transportation;Help with stairs or ramp for entrance      Equipment Recommendations  Other (comment) (defer)    Recommendations for Other Services       Precautions / Restrictions Precautions Precautions: Fall Precaution Comments: seizure, wrist restraints, mitts Restrictions Weight Bearing Restrictions: No     Mobility  Bed Mobility Overal bed mobility: Needs Assistance Bed Mobility: Supine to Sit     Supine to sit: Mod assist     General bed mobility comments: Pt able to initiate, modA at trunk and assist for scooting out to edge of bed     Transfers Overall transfer level: Needs assistance Equipment used: Rolling walker (2 wheels), 2 person hand held assist Transfers: Sit to/from Stand, Bed to chair/wheelchair/BSC Sit to Stand: Min assist, +2 physical assistance Stand pivot transfers: Mod assist, +2 physical assistance         General transfer comment: Initially stood up to RW with minA + 2, cues provided for proper hand placement. Pt with difficulty sequencing and keeping L foot elevated off of ground. Returned to sitting and performed face to face transfer from bed to chair with modA + 2    Ambulation/Gait               General Gait Details: deferred due to cognition   Stairs             Wheelchair Mobility     Tilt Bed    Modified Rankin (Stroke Patients Only)       Balance Overall balance assessment: Needs assistance Sitting-balance support: Feet supported Sitting balance-Leahy Scale: Fair     Standing balance support: Bilateral upper extremity supported Standing balance-Leahy Scale: Poor                              Cognition Arousal/Alertness: Awake/alert Behavior During Therapy: Restless Overall Cognitive Status: Impaired/Different from baseline Area of Impairment: Orientation, Attention, Memory, Following commands, Safety/judgement, Awareness, Problem solving                 Orientation Level: Disoriented to, Place, Time, Situation Current Attention Level: Sustained, Focused Memory: Decreased short-term memory Following Commands: Follows one step commands with increased time Safety/Judgement: Decreased awareness of safety, Decreased awareness of deficits Awareness: Intellectual Problem Solving: Difficulty  sequencing, Requires verbal cues, Requires tactile cues General Comments: Pt A&O to self, needs cues for recalling birthday and place (initially stating she was at [facility]). Pt very restless, with difficulty problem solving and sequencing throughout session.  Responds well to son and making a joke about not telling her age.        Exercises      General Comments        Pertinent Vitals/Pain Pain Assessment Pain Assessment: Faces Faces Pain Scale: Hurts little more Pain Location: LUE, generalized with movement Pain Descriptors / Indicators: Grimacing Pain Intervention(s): Monitored during session, Limited activity within patient's tolerance    Home Living                          Prior Function            PT Goals (current goals can now be found in the care plan section) Acute Rehab PT Goals Patient Stated Goal: did not state Potential to Achieve Goals: Good Progress towards PT goals: Progressing toward goals    Frequency    Min 1X/week      PT Plan Current plan remains appropriate    Co-evaluation              AM-PAC PT "6 Clicks" Mobility   Outcome Measure  Help needed turning from your back to your side while in a flat bed without using bedrails?: A Lot Help needed moving from lying on your back to sitting on the side of a flat bed without using bedrails?: A Lot Help needed moving to and from a bed to a chair (including a wheelchair)?: A Lot Help needed standing up from a chair using your arms (e.g., wheelchair or bedside chair)?: A Lot Help needed to walk in hospital room?: Total Help needed climbing 3-5 steps with a railing? : Total 6 Click Score: 10    End of Session Equipment Utilized During Treatment: Gait belt Activity Tolerance: Patient tolerated treatment well Patient left: in chair;with call bell/phone within reach;with chair alarm set;with restraints reapplied;with family/visitor present Nurse Communication: Mobility status PT Visit Diagnosis: Other abnormalities of gait and mobility (R26.89);Muscle weakness (generalized) (M62.81);Other symptoms and signs involving the nervous system (R29.898)     Time: 1324-4010 PT Time Calculation (min) (ACUTE ONLY): 20 min  Charges:     $Therapeutic Activity: 8-22 mins PT General Charges $$ ACUTE PT VISIT: 1 Visit                     Lillia Pauls, PT, DPT Acute Rehabilitation Services Office [phone]    Norval Morton 02/07/2023, 10:02 AM

## 2023-02-07 NOTE — Progress Notes (Addendum)
  Inpatient Rehabilitation Admissions Coordinator   Met with patient and son, [name], at bedside for rehab assessment. We discussed goals and expectations of a possible CIR admit. They prefer CIR for rehab. Family can provide expected caregiver support that is recommended of supervision level. Son, [name] and [name] provide 24/7 assist prior to admit. I will begin insurance Auth with [facility] Medicare for possible CIR admit pending approval. Please call me with any questions.   Ottie Glazier, RN, MSN Rehab Admissions Coordinator [phone]  I await OT eval to begin Auth.

## 2023-02-07 NOTE — Progress Notes (Signed)
Neurology Progress Note   S:// 89 y.o. female with past medical history of HTN, HLD, DM, MI,  CAD s/p DES stent, seizure disorder on Lamictal daily who presented via EMS for seizure activity. Per report, 2 episodes of starring off and mouth clenching with the 2nd episode causing her to fall of the commode. Patient uses walker at home, and lives with son [name]. Required two-person PT assistance up to chair and was unable to walk in room with their assistance/walker.  On exam today, sitting in chair with son at bedside. She is alert to self, place and month. She does not know the year and does not remember coming into the hospital. Confirmed with son that she takes her Lamictal BID, ok to restart this as she is taking PO.    O:// Current vital signs: BP (!) 154/62   Pulse 96   Temp 99.7 F (37.6 C)   Resp (!) 21   Wt 79.6 kg   SpO2 98%   BMI 29.20 kg/m  Vital signs in last 24 hours: Temp:  [98.2 F (36.8 C)-100.2 F (37.9 C)] 99.7 F (37.6 C) (07/23 0800) Pulse Rate:  [74-97] 96 (07/23 0800) Resp:  [12-28] 21 (07/23 0800) BP: (97-158)/(31-101) 154/62 (07/23 0800) SpO2:  [90 %-100 %] 98 % (07/23 0820) FiO2 (%):  [40 %] 40 % (07/22 1030) Weight:  [79.6 kg] 79.6 kg (07/23 0421) GENERAL: Awake, alert in NAD HEENT: - Normocephalic and atraumatic, dry mm LUNGS - Clear to auscultation bilaterally with no wheezes CV - S1S2 RRR, no m/r/g, equal pulses bilaterally. ABDOMEN - Soft, nontender, nondistended with normoactive BS Ext: warm, well perfused, intact peripheral pulses NEURO:  Mental Status: Awake. Alert to self, place, month. Disoriented to year, does not remember coming into the hospital. Poor attention.  Language: speech is slightly dysarthric, son states this is her baseline.  Naming, repetition, fluency, and comprehension intact. Cranial Nerves: PERRL. EOMI, visual fields full, no facial asymmetry, facial sensation intact, hearing intact, tongue/uvula/soft palate midline, normal  sternocleidomastoid and trapezius muscle strength. No evidence of tongue atrophy Motor: 4+/5, generalized weakness, no drift.  Tone: is normal and bulk is normal Sensation- Intact to light touch bilaterally Coordination: FTN intact bilaterally, no ataxia in BLE. Gait- deferred  Medications  Current Facility-Administered Medications:    0.9 %  sodium chloride infusion, 250 mL, Intravenous, Continuous, Paliwal, Aditya, MD   Chlorhexidine Gluconate Cloth 2 % PADS 6 each, 6 each, Topical, Daily, Briant Sites, DO, 6 each at 02/06/23 1500   diclofenac Sodium (VOLTAREN) 1 % topical gel 2 g, 2 g, Topical, QID, Groce, Sarah F, NP, 2 g at 02/06/23 2300   docusate (COLACE) 50 MG/5ML liquid 100 mg, 100 mg, Per Tube, BID PRN, Briant Sites, DO   enoxaparin (LOVENOX) injection 30 mg, 30 mg, Subcutaneous, Q12H, Hunsucker, Lesia Sago, MD, 30 mg at 02/06/23 2246   famotidine (PEPCID) tablet 20 mg, 20 mg, Per Tube, BID, Briant Sites, DO   feeding supplement (ENSURE ENLIVE / ENSURE PLUS) liquid 237 mL, 237 mL, Oral, BID BM, Hunsucker, Lesia Sago, MD   fluticasone furoate-vilanterol (BREO ELLIPTA) 200-25 MCG/ACT 1 puff, 1 puff, Inhalation, Daily, Suzie Portela, John D, PA-C   HYDROmorphone (DILAUDID) injection 0.5 mg, 0.5 mg, Intravenous, Q4H PRN, Hunsucker, Lesia Sago, MD, 0.5 mg at 02/06/23 1631   insulin aspart (novoLOG) injection 0-9 Units, 0-9 Units, Subcutaneous, Q4H, Briant Sites, DO, 1 Units at 02/06/23 1621   ipratropium-albuterol (DUONEB) 0.5-2.5 (3) MG/3ML nebulizer solution 3 mL, 3 mL,  Nebulization, Q4H PRN, Ama, Mcmaster, PA-C   Oral care mouth rinse, 15 mL, Mouth Rinse, 4 times per day, Hunsucker, Lesia Sago, MD, 15 mL at 02/07/23 0813   Oral care mouth rinse, 15 mL, Mouth Rinse, PRN, Hunsucker, Lesia Sago, MD   polyethylene glycol (MIRALAX / GLYCOLAX) packet 17 g, 17 g, Per Tube, Daily PRN, Briant Sites, DO   rosuvastatin (CRESTOR) tablet 40 mg, 40 mg, Oral, Daily, Angie, Piercey, PA-C    sertraline (ZOLOFT) tablet 100 mg, 100 mg, Oral, QHS, Browder, John D, PA-C   umeclidinium bromide (INCRUSE ELLIPTA) 62.5 MCG/ACT 1 puff, 1 puff, Inhalation, Daily, Jerrie, Schussler, PA-C  Labs CBC    Component Value Date/Time   WBC 7.3 02/07/2023 0708   RBC 3.20 (L) 02/07/2023 0708   HGB 9.5 (L) 02/07/2023 0708   HGB 13.0 12/14/2016 1606   HCT 28.8 (L) 02/07/2023 0708   HCT 42.3 08/19/2020 1330   PLT 226 02/07/2023 0708   PLT 245 12/14/2016 1606   MCV 90.0 02/07/2023 0708   MCV 87 12/14/2016 1606   MCH 29.7 02/07/2023 0708   MCHC 33.0 02/07/2023 0708   RDW 13.8 02/07/2023 0708   RDW 13.7 12/14/2016 1606   LYMPHSABS 1.5 02/07/2023 0708   MONOABS 0.7 02/07/2023 0708   EOSABS 0.1 02/07/2023 0708   BASOSABS 0.0 02/07/2023 0708    CMP     Component Value Date/Time   NA 137 02/07/2023 0708   NA 139 12/14/2016 1606   K 3.6 02/07/2023 0708   CL 110 02/07/2023 0708   CO2 19 (L) 02/07/2023 0708   GLUCOSE 100 (H) 02/07/2023 0708   GLUCOSE 124 (H) 07/28/2006 1202   BUN 15 02/07/2023 0708   BUN 16 12/14/2016 1606   CREATININE 0.81 02/07/2023 0708   CREATININE 0.98 (H) 07/03/2020 0938   CALCIUM 8.3 (L) 02/07/2023 0708   PROT 5.9 (L) 02/07/2023 0708   ALBUMIN 3.1 (L) 02/07/2023 0708   AST 25 02/07/2023 0708   ALT 16 02/07/2023 0708   ALKPHOS 27 (L) 02/07/2023 0708   BILITOT 0.4 02/07/2023 0708   GFRNONAA >60 02/07/2023 0708   GFRNONAA 66 07/01/2020 0927   GFRAA 77 07/01/2020 0927    Lipid Panel     Component Value Date/Time   CHOL 122 08/19/2022 0912   TRIG 239 (H) 02/07/2023 0708   TRIG 1693 (HH) 07/28/2006 1202   HDL 53.40 08/19/2022 0912   CHOLHDL 2 08/19/2022 0912   VLDL 36.6 08/19/2022 0912   LDLCALC 32 08/19/2022 0912   LDLCALC 42 02/18/2020 1529   LDLDIRECT 44.0 07/08/2021 1612    Lab Results  Component Value Date   HGBA1C 5.5 12/19/2022     Imaging I have reviewed images in epic and the results pertinent to this consultation are:  CT-scan of the brain:   no acute process  MRI examination of the brain  Assessment: 89 y.o. female with past medical history of HTN, HLD, DM, MI,  CAD s/p DES stent, seizure disorder on Lamictal daily who presented as a trauma from home via EMS for seizure activity and falling off the commode. Per report had 2 episodes of starring off and mouth clenching with the 2nd episode causing her to fall of the commode.  Extubated 7/22.  Confirmed with son that patient takes lamictal 25 BID. Will restart.  UA positive, pending culture. Blood cultures negative prelim.   Impression: ?Breakthrough Seizures, in setting of UTI (urine cultures pending) Evaluate for syncope  Recommendations: Agree  with transfer out of ICU Start home dose of Lamictal  Seizure precautions UTI treatment per primary Avoid hypotension, dehydration   SEIZURE PRECAUTIONS Per [facility] statutes, patients with seizures are not allowed to drive until they have been seizure-free for six months.   Use caution when using heavy equipment or power tools. Avoid working on ladders or at heights. Take showers instead of baths. Ensure the water temperature is not too high on the home water heater. Do not go swimming alone. Do not lock yourself in a room alone (i.e. bathroom). When caring for infants or small children, sit down when holding, feeding, or changing them to minimize risk of injury to the child in the event you have a seizure. Maintain good sleep hygiene. Avoid alcohol.    If patient has another seizure, call 911 and bring them back to the ED if: A.  The seizure lasts longer than 5 minutes.      B.  The patient doesn't wake shortly after the seizure or has new problems such as difficulty seeing, speaking or moving following the seizure C.  The patient was injured during the seizure D.  The patient has a temperature over 102 F (39C) E.  The patient vomited during the seizure and now is having trouble breathing   Pt seen by Neuro NP/APP and  later by MD. Note/plan to be edited by MD as needed.    Lynnae January, DNP, AGACNP-BC [facility] Please use AMION for contact information & EPIC for messaging.   Attending Neurohospitalist Addendum Patient seen and examined with APP/Resident. Agree with the history and physical as documented above. Agree with the plan as documented, which I helped formulate. I have independently reviewed the chart, obtained history, review of systems and examined the patient.I have personally reviewed pertinent head/neck/spine imaging (CT/MRI). Please feel free to call with any questions.  -- Milon Dikes, MD Neurologist [facility] Pager: [phone]

## 2023-02-07 NOTE — Telephone Encounter (Signed)
Pt admitted to the hospital 02/06/23 for fall and head injury. Holding.

## 2023-02-08 ENCOUNTER — Inpatient Hospital Stay (HOSPITAL_COMMUNITY): Payer: Medicare HMO

## 2023-02-08 DIAGNOSIS — R569 Unspecified convulsions: Secondary | ICD-10-CM | POA: Diagnosis not present

## 2023-02-08 LAB — GLUCOSE, CAPILLARY
Glucose-Capillary: 132 mg/dL — ABNORMAL HIGH (ref 70–99)
Glucose-Capillary: 147 mg/dL — ABNORMAL HIGH (ref 70–99)
Glucose-Capillary: 148 mg/dL — ABNORMAL HIGH (ref 70–99)
Glucose-Capillary: 152 mg/dL — ABNORMAL HIGH (ref 70–99)
Glucose-Capillary: 243 mg/dL — ABNORMAL HIGH (ref 70–99)
Glucose-Capillary: 250 mg/dL — ABNORMAL HIGH (ref 70–99)

## 2023-02-08 LAB — URINE CULTURE: Culture: NO GROWTH

## 2023-02-08 LAB — CBC
HCT: 29.2 % — ABNORMAL LOW (ref 36.0–46.0)
Hemoglobin: 9.8 g/dL — ABNORMAL LOW (ref 12.0–15.0)
MCH: 28.8 pg (ref 26.0–34.0)
MCHC: 33.6 g/dL (ref 30.0–36.0)
MCV: 85.9 fL (ref 80.0–100.0)
Platelets: 242 10*3/uL (ref 150–400)
RBC: 3.4 MIL/uL — ABNORMAL LOW (ref 3.87–5.11)
RDW: 13.6 % (ref 11.5–15.5)
WBC: 9.9 10*3/uL (ref 4.0–10.5)
nRBC: 0 % (ref 0.0–0.2)

## 2023-02-08 LAB — BLOOD GAS, ARTERIAL
Acid-base deficit: 3.4 mmol/L — ABNORMAL HIGH (ref 0.0–2.0)
Bicarbonate: 20.1 mmol/L (ref 20.0–28.0)
Drawn by: 53547
O2 Saturation: 95.3 %
Patient temperature: 37.4
pCO2 arterial: 32 mmHg (ref 32–48)
pH, Arterial: 7.41 (ref 7.35–7.45)
pO2, Arterial: 63 mmHg — ABNORMAL LOW (ref 83–108)

## 2023-02-08 LAB — BASIC METABOLIC PANEL
Anion gap: 11 (ref 5–15)
BUN: 14 mg/dL (ref 8–23)
CO2: 21 mmol/L — ABNORMAL LOW (ref 22–32)
Calcium: 8.9 mg/dL (ref 8.9–10.3)
Chloride: 108 mmol/L (ref 98–111)
Creatinine, Ser: 0.83 mg/dL (ref 0.44–1.00)
GFR, Estimated: >60 mL/min (ref >60)
Glucose, Bld: 132 mg/dL — ABNORMAL HIGH (ref 70–99)
Potassium: 3.5 mmol/L (ref 3.5–5.1)
Sodium: 140 mmol/L (ref 135–145)

## 2023-02-08 LAB — CULTURE, BLOOD (ROUTINE X 2)

## 2023-02-08 MED ORDER — NYSTATIN 100000 UNIT/ML MT SUSP
5.0000 mL | Freq: Four times a day (QID) | OROMUCOSAL | Status: AC
  Administered 2023-02-08 – 2023-02-15 (×27): 500000 [IU] via ORAL
  Filled 2023-02-08 (×27): qty 5

## 2023-02-08 MED ORDER — OLANZAPINE 10 MG IM SOLR
2.5000 mg | Freq: Once | INTRAMUSCULAR | Status: AC | PRN
  Administered 2023-02-09: 2.5 mg via INTRAVENOUS
  Filled 2023-02-08: qty 10

## 2023-02-08 MED ORDER — SERTRALINE HCL 100 MG PO TABS
100.0000 mg | ORAL_TABLET | Freq: Every day | ORAL | Status: DC
  Administered 2023-02-08 – 2023-02-17 (×10): 100 mg via ORAL
  Filled 2023-02-08 (×10): qty 1

## 2023-02-08 MED ORDER — OXYCODONE HCL 5 MG PO TABS
2.5000 mg | ORAL_TABLET | Freq: Four times a day (QID) | ORAL | Status: DC | PRN
  Administered 2023-02-08 – 2023-02-09 (×2): 2.5 mg via ORAL
  Filled 2023-02-08 (×2): qty 1

## 2023-02-08 NOTE — Progress Notes (Addendum)
Neurology Progress Note   S:// 89 y.o. female with past medical history of HTN, HLD, DM, MI,  CAD s/p DES stent, seizure disorder on Lamictal daily who presented via EMS for seizure activity. Per report, 2 episodes of starring off and mouth clenching with the 2nd episode causing her to fall of the commode. Patient uses walker at home, and lives with son [name]. Required two-person PT assistance up to chair and was unable to walk in room with their assistance/walker.  7/23: Lamictal restarted. Transferred out of ICU.  On exam today, lying in bed with granddaughter at bedside. She is alert to self, month and year. Respirations labored with abdominal breathing, not see on examinations yesterday.  ABG ordered, discussed with RN.    O:// Current vital signs: BP (!) 147/102   Pulse (!) 123   Temp 99.3 F (37.4 C) (Oral)   Resp 18   Ht 5\' 5"  (1.651 m)   Wt 80.6 kg   SpO2 (!) 89%   BMI 29.57 kg/m  Vital signs in last 24 hours: Temp:  [98.3 F (36.8 C)-99.6 F (37.6 C)] 99.3 F (37.4 C) (07/24 0826) Pulse Rate:  [93-123] 123 (07/24 0826) Resp:  [17-26] 18 (07/24 0826) BP: (95-168)/(55-102) 147/102 (07/24 0826) SpO2:  [89 %-100 %] 89 % (07/24 0826) Weight:  [79.6 kg-80.6 kg] 80.6 kg (07/24 0548) GENERAL: Awake, alert in NAD HEENT: - Normocephalic and atraumatic, dry mm LUNGS - labored with abdominal breathing CV - S1S2 RRR, no m/r/g, equal pulses bilaterally. ABDOMEN - Soft, nontender, slightly distended Ext: warm, well perfused, intact peripheral pulses  NEURO:  Mental Status: Awake. Alert to self, month, year. Disoriented to place, thought she was at home.  Language: speech is slightly dysarthric, son states this is her baseline.  Naming, repetition, fluency, and comprehension intact. Cranial Nerves: PERRL. EOMI, visual fields full, no facial asymmetry, facial sensation intact, hearing intact, tongue/uvula/soft palate midline, normal sternocleidomastoid and trapezius muscle strength.  No evidence of tongue atrophy Motor: 4+/5, generalized weakness, no drift.  Tone: is normal and bulk is normal Sensation- Intact to light touch bilaterally Coordination: FTN intact bilaterally, no ataxia in BLE. Gait- deferred  Medications  Current Facility-Administered Medications:    0.9 %  sodium chloride infusion, 250 mL, Intravenous, Continuous, Paliwal, Aditya, MD   Chlorhexidine Gluconate Cloth 2 % PADS 6 each, 6 each, Topical, Daily, Briant Sites, DO, 6 each at 02/07/23 1050   diclofenac Sodium (VOLTAREN) 1 % topical gel 2 g, 2 g, Topical, QID, [place], Sarah F, NP, 2 g at 02/07/23 2119   docusate (COLACE) 50 MG/5ML liquid 100 mg, 100 mg, Oral, BID PRN, Hunsucker, Lesia Sago, MD   enoxaparin (LOVENOX) injection 30 mg, 30 mg, Subcutaneous, Q12H, Hunsucker, Lesia Sago, MD, 30 mg at 02/07/23 2113   feeding supplement (ENSURE ENLIVE / ENSURE PLUS) liquid 237 mL, 237 mL, Oral, BID BM, Hunsucker, Lesia Sago, MD, 237 mL at 02/07/23 1241   fluticasone furoate-vilanterol (BREO ELLIPTA) 200-25 MCG/ACT 1 puff, 1 puff, Inhalation, Daily, Suzie Portela, John D, PA-C   insulin aspart (novoLOG) injection 0-9 Units, 0-9 Units, Subcutaneous, Q4H, Briant Sites, DO, 1 Units at 02/08/23 0401   ipratropium-albuterol (DUONEB) 0.5-2.5 (3) MG/3ML nebulizer solution 3 mL, 3 mL, Nebulization, Q4H PRN, Suzie Portela, John D, PA-C   lamoTRIgine (LAMICTAL) tablet 25 mg, 25 mg, Oral, BID, Hetty Blend C, NP, 25 mg at 02/07/23 2113   Oral care mouth rinse, 15 mL, Mouth Rinse, 4 times per day, Hunsucker, Lesia Sago, MD, 15  mL at 02/07/23 2118   Oral care mouth rinse, 15 mL, Mouth Rinse, PRN, Hunsucker, Lesia Sago, MD   oxyCODONE (Oxy IR/ROXICODONE) immediate release tablet 5 mg, 5 mg, Oral, Q6H PRN, Pia Mau D, PA-C   pantoprazole (PROTONIX) EC tablet 40 mg, 40 mg, Oral, Daily, Hunsucker, Lesia Sago, MD   polyethylene glycol (MIRALAX / GLYCOLAX) packet 17 g, 17 g, Oral, Daily PRN, Hunsucker, Lesia Sago, MD   rosuvastatin  (CRESTOR) tablet 40 mg, 40 mg, Oral, Daily, Jazmyn, Offner, PA-C, 40 mg at 02/07/23 1056   sertraline (ZOLOFT) tablet 100 mg, 100 mg, Oral, Shaolin, Armas, PA-C, 100 mg at 02/07/23 2113   umeclidinium bromide (INCRUSE ELLIPTA) 62.5 MCG/ACT 1 puff, 1 puff, Inhalation, Daily, Jachelle, Fluty, PA-C  Labs CBC    Component Value Date/Time   WBC 9.9 02/08/2023 0043   RBC 3.40 (L) 02/08/2023 0043   HGB 9.8 (L) 02/08/2023 0043   HGB 13.0 12/14/2016 1606   HCT 29.2 (L) 02/08/2023 0043   HCT 42.3 08/19/2020 1330   PLT 242 02/08/2023 0043   PLT 245 12/14/2016 1606   MCV 85.9 02/08/2023 0043   MCV 87 12/14/2016 1606   MCH 28.8 02/08/2023 0043   MCHC 33.6 02/08/2023 0043   RDW 13.6 02/08/2023 0043   RDW 13.7 12/14/2016 1606   LYMPHSABS 1.5 02/07/2023 0708   MONOABS 0.7 02/07/2023 0708   EOSABS 0.1 02/07/2023 0708   BASOSABS 0.0 02/07/2023 0708    CMP     Component Value Date/Time   NA 140 02/08/2023 0043   NA 139 12/14/2016 1606   K 3.5 02/08/2023 0043   CL 108 02/08/2023 0043   CO2 21 (L) 02/08/2023 0043   GLUCOSE 132 (H) 02/08/2023 0043   GLUCOSE 124 (H) 07/28/2006 1202   BUN 14 02/08/2023 0043   BUN 16 12/14/2016 1606   CREATININE 0.83 02/08/2023 0043   CREATININE 0.98 (H) 07/03/2020 0938   CALCIUM 8.9 02/08/2023 0043   PROT 5.9 (L) 02/07/2023 0708   ALBUMIN 3.1 (L) 02/07/2023 0708   AST 25 02/07/2023 0708   ALT 16 02/07/2023 0708   ALKPHOS 27 (L) 02/07/2023 0708   BILITOT 0.4 02/07/2023 0708   GFRNONAA >60 02/08/2023 0043   GFRNONAA 66 07/01/2020 0927   GFRAA 77 07/01/2020 0927    Lipid Panel     Component Value Date/Time   CHOL 122 08/19/2022 0912   TRIG 239 (H) 02/07/2023 0708   TRIG 1693 (HH) 07/28/2006 1202   HDL 53.40 08/19/2022 0912   CHOLHDL 2 08/19/2022 0912   VLDL 36.6 08/19/2022 0912   LDLCALC 32 08/19/2022 0912   LDLCALC 42 02/18/2020 1529   LDLDIRECT 44.0 07/08/2021 1612    Lab Results  Component Value Date   HGBA1C 5.5 12/19/2022      Imaging I have reviewed images in epic and the results pertinent to this consultation are:  CT-scan of the brain:  no acute process  MRI examination of the brain  Assessment: 89 y.o. female with past medical history of HTN, HLD, DM, MI,  CAD s/p DES stent, seizure disorder on Lamictal daily who presented as a trauma from home via EMS for seizure activity and falling off the commode. Per report had 2 episodes of starring off and mouth clenching with the 2nd episode causing her to fall of the commode.  Extubated 7/22. Lamictal restarted 7/23. UA positive, negative culture. Blood cultures negative prelim.   Impression: ?Breakthrough Seizures, in setting of dehydration vs ?  neoplasm Evaluate for syncope  Recommendations: Start home dose of Lamictal  Seizure precautions UTI treatment per primary F/u outpatient neurology 4-6 wks Avoid hypotension, dehydration   SEIZURE PRECAUTIONS Per [facility] statutes, patients with seizures are not allowed to drive until they have been seizure-free for six months.   Use caution when using heavy equipment or power tools. Avoid working on ladders or at heights. Take showers instead of baths. Ensure the water temperature is not too high on the home water heater. Do not go swimming alone. Do not lock yourself in a room alone (i.e. bathroom). When caring for infants or small children, sit down when holding, feeding, or changing them to minimize risk of injury to the child in the event you have a seizure. Maintain good sleep hygiene. Avoid alcohol.    If patient has another seizure, call 911 and bring them back to the ED if: A.  The seizure lasts longer than 5 minutes.      B.  The patient doesn't wake shortly after the seizure or has new problems such as difficulty seeing, speaking or moving following the seizure C.  The patient was injured during the seizure D.  The patient has a temperature over 102 F (39C) E.  The patient vomited during the  seizure and now is having trouble breathing   Pt seen by Neuro NP/APP and later by MD. Note/plan to be edited by MD as needed.    Lynnae January, DNP, AGACNP-BC [facility] Please use AMION for contact information & EPIC for messaging.    Attending Neurohospitalist Addendum Patient seen and examined with APP/Resident. Agree with the history and physical as documented above. Agree with the plan as documented, which I helped formulate. I have independently reviewed the chart, obtained history, review of systems and examined the patient.I have personally reviewed pertinent head/neck/spine imaging (CT/MRI). Please feel free to call with any questions.  -- Milon Dikes, MD Neurologist [facility] Pager: [phone]

## 2023-02-08 NOTE — Progress Notes (Signed)
Speech Language Pathology Treatment: Dysphagia  Patient Details Name: Michelle [name] MRN: [id number] DOB: Jan 29, 1933 Today's Date: 02/08/2023 Time: 7829-5621 SLP Time Calculation (min) (ACUTE ONLY): 25 min  Assessment / Plan / Recommendation Clinical Impression  Pt seen to address dysphagia goals including family concern for pt coughing with intake.   Pt alert but disoriented to her granddaughter - thinking she was her soon to be DIL.  Observed pt with Ensure with ice cream, thin water, grits and thin applesauce.  Mild delay in swallow noted with more viscous purees and thin liquids.  NO indication of airway compromise however and voice is clear and strong - family states not quite normal.  Pt with nearly full breakfast tray - she did not eat due to oral holding -  Son reports pt with some baseline oral holding - suspect this is due to her cognition.     Will modify diet to dys1/thin to maximize po efficiency.  Advised for pt to use Ensure with meals if decreases cough or discomfort with liquids.  Of note, pt enjoyed Ensure mixed with ice cream and advised her son that this may be a good way to get her nutrition.  Also pt needs to feed herself as able for improved  efficiency and airway protection.    Of note, pt also with some abdominal breathing - has COPD - which may impact swallow/respiratory reciprocity, thus strict precautions advised.    Pt also with white coating on tongue that may indicate oral candidiasis - she denies odynophagia however.   Sent picture to MD as pt reports this is not baseline.       HPI HPI: Michelle [name] is an 89 yo female who presented as trauma after having a seizure while utilizing the bedside toilet and hit her head. Head CT and Chest CT 7/22 negative for acute findings.  Pt with hx HTN, DM2, Hyperlipidemia, Dementia, CAD, COPD.  SLP follow up for dysphagia management indicated - OT reports family states pt having some coughing with intake and oral holding causing  concern for her airway protection with po intake.      SLP Plan  Continue with current plan of care      Recommendations for follow up therapy are one component of a multi-disciplinary discharge planning process, led by the attending physician.  Recommendations may be updated based on patient status, additional functional criteria and insurance authorization.    Recommendations  Medication Administration: Whole meds with puree Supervision: Full supervision/cueing for compensatory strategies Compensations: Minimize environmental distractions;Slow rate;Small sips/bites Postural Changes and/or Swallow Maneuvers: Seated upright 90 degrees;Upright 30-60 min after meal                  Oral care BID   Frequent or constant Supervision/Assistance Dysphagia, oral phase (R13.11)     Continue with current plan of care   [name], MS [facility] SLP Acute Rehab Services Office [phone]   [name]  02/08/2023, 12:20 PM

## 2023-02-08 NOTE — Plan of Care (Signed)
  Problem: Education: Goal: Ability to describe self-care measures that may prevent or decrease complications (Diabetes Survival Skills Education) will improve Outcome: Progressing Goal: Individualized Educational Video(s) Outcome: Progressing   Problem: Coping: Goal: Ability to adjust to condition or change in health will improve Outcome: Progressing   Problem: Fluid Volume: Goal: Ability to maintain a balanced intake and output will improve Outcome: Progressing   Problem: Health Behavior/Discharge Planning: Goal: Ability to identify and utilize available resources and services will improve Outcome: Progressing Goal: Ability to manage health-related needs will improve Outcome: Progressing   Problem: Metabolic: Goal: Ability to maintain appropriate glucose levels will improve Outcome: Progressing   Problem: Nutritional: Goal: Maintenance of adequate nutrition will improve Outcome: Progressing Goal: Progress toward achieving an optimal weight will improve Outcome: Progressing   Problem: Skin Integrity: Goal: Risk for impaired skin integrity will decrease Outcome: Progressing   Problem: Tissue Perfusion: Goal: Adequacy of tissue perfusion will improve Outcome: Progressing   Problem: Safety: Goal: Non-violent Restraint(s) Outcome: Progressing   Problem: Education: Goal: Knowledge of General Education information will improve Description: Including pain rating scale, medication(s)/side effects and non-pharmacologic comfort measures Outcome: Progressing   Problem: Health Behavior/Discharge Planning: Goal: Ability to manage health-related needs will improve Outcome: Progressing   Problem: Clinical Measurements: Goal: Ability to maintain clinical measurements within normal limits will improve Outcome: Progressing Goal: Will remain free from infection Outcome: Progressing Goal: Diagnostic test results will improve Outcome: Progressing Goal: Respiratory complications  will improve Outcome: Progressing Goal: Cardiovascular complication will be avoided Outcome: Progressing   Problem: Activity: Goal: Risk for activity intolerance will decrease Outcome: Progressing   Problem: Nutrition: Goal: Adequate nutrition will be maintained Outcome: Progressing   Problem: Coping: Goal: Level of anxiety will decrease Outcome: Progressing   Problem: Elimination: Goal: Will not experience complications related to bowel motility Outcome: Progressing Goal: Will not experience complications related to urinary retention Outcome: Progressing   Problem: Pain Managment: Goal: General experience of comfort will improve Outcome: Progressing   Problem: Safety: Goal: Ability to remain free from injury will improve Outcome: Progressing   Problem: Skin Integrity: Goal: Risk for impaired skin integrity will decrease Outcome: Progressing   

## 2023-02-08 NOTE — Evaluation (Addendum)
Occupational Therapy Evaluation Patient Details Name: Michelle [name] MRN: [id number] DOB: [birth year] Today's Date: 02/08/2023   History of Present Illness 89 y.o. female presents to [facility] hospital on 02/06/2023 after having a seizure, falling and striking her head. Pt intubated in the ED, extubated same date. PMH includes HTN, seizures, DMII, HLD, dementia, CAD, COPD.   Clinical Impression   PT admitted with seizure. Pt currently with functional limitiations due to the deficits listed below (see OT problem list). Pt at baseline able to name family and complete basic transfers mod I. Pt noted to have 87% 2L [place] with accessory breathing with static sitting EOB. Pt closing eyes and less responsive at EOB with VSS. Question vestibular deficits as pt with fluttering eye lids and refusal to open eyes with position change from EOB to supine. Pt with quick resolve <3 minutes and opening eyes again to staff. Pt reports pain on R side shoulder flexion and then hip to foot area. Pt aware she is in [place] and month is July.  Pt will benefit from skilled OT to increase their independence and safety with adls and balance to allow discharge Patient will benefit from intensive inpatient follow up therapy, >3 hours/day  Contacted SLP team regarding concerns with prolonged chewing food and coughing with drinking  Family calls her Michelle [name] .       Recommendations for follow up therapy are one component of a multi-disciplinary discharge planning process, led by the attending physician.  Recommendations may be updated based on patient status, additional functional criteria and insurance authorization.   Assistance Recommended at Discharge Intermittent Supervision/Assistance  Patient can return home with the following Two people to help with walking and/or transfers;Two people to help with bathing/dressing/bathroom    Functional Status Assessment  Patient has had a recent decline in their functional status and  demonstrates the ability to make significant improvements in function in a reasonable and predictable amount of time.  Equipment Recommendations  Wheelchair (measurements OT);Wheelchair cushion (measurements OT)    Recommendations for Other Services Rehab consult     Precautions / Restrictions Precautions Precautions: Fall Precaution Comments: seizure, wrist restraints, mitts      Mobility Bed Mobility Overal bed mobility: Needs Assistance Bed Mobility: Rolling, Supine to Sit, Sit to Supine Rolling: Mod assist   Supine to sit: Mod assist Sit to supine: +2 for physical assistance, Mod assist   General bed mobility comments: Pt requires (A) to sequence progression to EOB. Pt sitting EOB and then stopped talking closing eyse not responding. PT laid back on bed surface with immediate response VSS return to sitting position and movement at EOB used to help with arousal. pt progressed to standing at EOB. pt reports pain down R side from back to legs. pt reports legs feel weak    Transfers Overall transfer level: Needs assistance Equipment used: Rolling walker (2 wheels) Transfers: Sit to/from Stand Sit to Stand: +2 physical assistance, Mod assist, From elevated surface, +2 safety/equipment           General transfer comment: pt unsteady and closing eyes with max cueing. Family present and helping cue patient. pt static standing in RW with mod (A) with posterior LOB once.      Balance Overall balance assessment: Needs assistance Sitting-balance support: Bilateral upper extremity supported, Feet supported Sitting balance-Leahy Scale: Poor   Postural control: Posterior lean Standing balance support: Bilateral upper extremity supported, During functional activity, Reliant on assistive device for balance Standing balance-Leahy Scale: Zero Standing balance comment: (  A) for static standing balance                           ADL either performed or assessed with clinical  judgement   ADL Overall ADL's : Needs assistance/impaired Eating/Feeding: Minimal assistance;Sitting   Grooming: Moderate assistance;Sitting   Upper Body Bathing: Maximal assistance   Lower Body Bathing: Total assistance   Upper Body Dressing : Maximal assistance   Lower Body Dressing: Total assistance Lower Body Dressing Details (indicate cue type and reason): a Toilet Transfer: +2 for physical assistance;Moderate assistance                   Vision Ability to See in Adequate Light: 1 Impaired Vision Assessment?: Vision impaired- to be further tested in functional context     Perception     Praxis      Pertinent Vitals/Pain Pain Assessment Pain Assessment: Faces Faces Pain Scale: Hurts little more Pain Location: LUE, generalized weakness Pain Descriptors / Indicators: Grimacing Pain Intervention(s): Repositioned, Monitored during session, Limited activity within patient's tolerance     Hand Dominance Right   Extremity/Trunk Assessment Upper Extremity Assessment Upper Extremity Assessment: RUE deficits/detail RUE Deficits / Details: pain with shoulder flexion   Lower Extremity Assessment Lower Extremity Assessment: Generalized weakness   Cervical / Trunk Assessment Cervical / Trunk Assessment: Kyphotic   Communication Communication Communication: Expressive difficulties   Cognition Arousal/Alertness: Awake/alert Behavior During Therapy: Flat affect Overall Cognitive Status: Impaired/Different from baseline Area of Impairment: Orientation, Attention, Memory, Following commands, Safety/judgement, Awareness, Problem solving                 Orientation Level: Disoriented to, Situation, Place (knew she was in [place] and its [place]) Current Attention Level: Sustained, Focused Memory: Decreased recall of precautions, Decreased short-term memory Following Commands: Follows one step commands with increased time, Follows one step commands  inconsistently Safety/Judgement: Decreased awareness of safety, Decreased awareness of deficits Awareness: Intellectual Problem Solving: Slow processing, Difficulty sequencing, Requires verbal cues, Requires tactile cues General Comments: pt with word finding deficits noted during session called granddaughter [name] instead of Mel Almond. pt unable to recall children name but when seeing son calls him     General Comments  noted to have bruising bil Shoulder , R scapula, sacrum area, L LE shin area, L heel area with preventive dressing    Exercises     Shoulder Instructions      Home Living Family/patient expects to be discharged to:: Private residence Living Arrangements: Children Available Help at Discharge: Available 24 hours/day Type of Home: House Home Access: Stairs to enter Secretary/administrator of Steps: 4 Entrance Stairs-Rails: Right Home Layout: One level     Bathroom Shower/Tub: Producer, television/film/video: Standard Bathroom Accessibility: Yes How Accessible: Accessible via walker Home Equipment: Rollator (4 wheels)   Additional Comments: granddaughter present entire session and son arrives at the end  Lives With: Son    Prior Functioning/Environment Prior Level of Function : Patient poor historian/Family not available             Mobility Comments: uses RW ADLs Comments: BSC at baseline        OT Problem List: Decreased strength;Decreased activity tolerance;Impaired balance (sitting and/or standing);Decreased coordination;Decreased cognition;Decreased safety awareness;Decreased knowledge of use of DME or AE;Decreased knowledge of precautions;Obesity      OT Treatment/Interventions: Self-care/ADL training;Therapeutic exercise;Neuromuscular education;Energy conservation;DME and/or AE instruction;Manual therapy;Modalities;Therapeutic activities;Cognitive remediation/compensation;Patient/family education;Balance training  OT Goals(Current  goals can be found in the care plan section) Acute Rehab OT Goals Patient Stated Goal: none stated by pt OT Goal Formulation: With family Time For Goal Achievement: 02/23/23 Potential to Achieve Goals: Good  OT Frequency: Min 1X/week    Co-evaluation              AM-PAC OT "6 Clicks" Daily Activity     Outcome Measure Help from another person eating meals?: A Lot Help from another person taking care of personal grooming?: A Lot Help from another person toileting, which includes using toliet, bedpan, or urinal?: A Lot Help from another person bathing (including washing, rinsing, drying)?: A Lot Help from another person to put on and taking off regular upper body clothing?: A Lot Help from another person to put on and taking off regular lower body clothing?: A Lot 6 Click Score: 12   End of Session Equipment Utilized During Treatment: Gait belt;Rolling walker (2 wheels);Oxygen Nurse Communication: Mobility status;Precautions;Weight bearing status  Activity Tolerance: Patient tolerated treatment well Patient left: in bed;with call bell/phone within reach;with family/visitor present;with restraints reapplied (mittens only RN made aware)  OT Visit Diagnosis: Unsteadiness on feet (R26.81);Repeated falls (R29.6);Muscle weakness (generalized) (M62.81)                Time: 1110-1145 OT Time Calculation (min): 35 min Charges:  OT General Charges $OT Visit: 1 Visit OT Evaluation $OT Eval Moderate Complexity: 1 Mod OT Treatments $Self Care/Home Management : 8-22 mins   Brynn, OTR/L  Acute Rehabilitation Services Office: [phone] .   Mateo Flow 02/08/2023, 11:58 AM

## 2023-02-08 NOTE — Progress Notes (Signed)
PROGRESS NOTE    Michelle [name]  ZOX:[id number] DOB: 11-20-1932 DOA: 02/06/2023 PCP: Shelva Majestic, MD   Brief Narrative:  Patient is an 89 year old female with past medical history of hypertension, diabetes type 2, hyperlipidemia, dementia, coronary disease, COPD with 55-pack-year smoking history presenting as a trauma after seizure while falling from a seated position on the toilet.  Seizure appears of lasted approximately 2 minutes, patient markedly confused postevent combative with EMS requiring Versed.  Patient ultimately intubated due to combative nature and concern for trauma.  PCCM and trauma teams consulted initially for admission.  Patient's acute metabolic encephalopathy continues to improve slowly, no further episodes of seizures at this time but no clear etiology for seizure provocation either.  Patient ultimately extubated 7/22, resumed on home Lamictal 25 twice daily.  Assessment & Plan:   Principal Problem:   Seizure (HCC) Active Problems:   Altered mental status   Scalp laceration  Acute metabolic encephalopathy, secondary to presumed breakthrough seizure Complicated by underlying dementia, trauma and head laceration -Unclear etiology of breakthrough seizure, continue patient on home Lamictal per neurology -Trauma initially following, appears stable, continue wound care over scalp laceration -Patient remains seizure-free per EEG -Hold any further CNS depressing meds, follow clinically (continue to wean off oxycodone as possible in the setting of trauma) -Zyprexa as needed for agitation overnight, delirium precautions ongoing  Acute hypoxic resp failure req mechanical ventilation:  COPD:  Continue on advair and spiriva patient's home medications 55-pack-year smoking history, quit smoking 2008 Lung nodule measure up to 2 cm Extubated, initially on room air Acute event this morning with notable hypoxia, chest x-ray shows bilateral findings consistent with  infiltrate, cannot rule out aspiration pneumonia.  Speech continues to follow, advance diet as tolerated -breo and incruse ellipta; prn duoneb for wheezing -will need repeat CT in 3 months for lung nodule   Acute metabolic Acidosis Lactic acidosis, resolved -Resolved,   Hypotension History of hypertension -Without shock or need for pressors -Hold sedating medications, hold amlodipine, carvedilol, enalapril, hydralazine, furosemide per home med rec -Blood pressure remains labile in the setting of agitation and sedation, follow closely, continues on telemetry  Past medical history, stable CAD Hyperlipidemia -hold asa and plavix -Hold antihypertensives as above due to hypotension   Multi-septated cystic ovarian neoplasm -will need to f/u outpt w/ GYN (will likely need hysterectomy)   T2DM, well-controlled -Well-controlled A1c 5.5 continue sliding scale insulin hypoglycemic protocol   Depression -Continue home sertraline, hold nortriptyline  DVT prophylaxis: enoxaparin (LOVENOX) injection 30 mg Start: 02/06/23 1030 SCDs Start: 02/06/23 0402   Code Status:   Code Status: Full Code  Family Communication: Niece at bedside  Status is: Inpatient  Dispo: The patient is from: Home              Anticipated d/c is to: To be determined              Anticipated d/c date is: 72+ hours              Patient currently not medically stable for discharge  Consultants:  PCCM, neurology, trauma  Procedures:  None  Antimicrobials:  None indicated  Subjective: No acute issues or events overnight, this morning patient had transient episode of respiratory distress with mild hypoxia, x-ray concerning for infiltrate/aspiration.  Will monitor closely given rapid improvement in patient's respiratory status.  Review of systems otherwise somewhat limited by the patient but she is able to recognize family and understands that we are not at  home although she cannot recall specifically where she  currently resides  Objective: Vitals:   02/07/23 1948 02/07/23 2331 02/08/23 0341 02/08/23 0548  BP: (!) 150/74 (!) 168/78 (!) 168/76   Pulse: 93 99 94   Resp: 18 17 18    Temp: 99.6 F (37.6 C) 99.2 F (37.3 C) 99.3 F (37.4 C)   TempSrc: Oral Oral Oral   SpO2: 96% 100%    Weight:    80.6 kg  Height:        Intake/Output Summary (Last 24 hours) at 02/08/2023 0812 Last data filed at 02/07/2023 1833 Gross per 24 hour  Intake --  Output 305 ml  Net -305 ml   Filed Weights   02/07/23 0421 02/07/23 1532 02/08/23 0548  Weight: 79.6 kg 79.6 kg 80.6 kg    Examination:  General:  Pleasantly resting in bed, No acute distress. HEENT: Head laceration, superficial over the right parietal area Neck:  Without mass or deformity.  Trachea is midline. Lungs:  Clear to auscultate bilaterally without rhonchi, wheeze, or rales. Heart:  Regular rate and rhythm.  Without murmurs, rubs, or gallops. Abdomen:  Soft, nontender, nondistended.  Without guarding or rebound. Extremities: Without cyanosis, clubbing, edema, or obvious deformity. Skin:  Warm and dry, no erythema.  Data Reviewed: I have personally reviewed following labs and imaging studies  CBC: Recent Labs  Lab 02/06/23 0141 02/06/23 0155 02/06/23 0420 02/06/23 0850 02/06/23 1405 02/07/23 0708 02/08/23 0043  WBC 8.3  --   --   --  12.6* 7.3 9.9  NEUTROABS  --   --   --   --   --  4.9  --   HGB 11.4*   < > 10.9* 10.5* 10.4* 9.5* 9.8*  HCT 35.8*   < > 32.0* 31.0* 31.0* 28.8* 29.2*  MCV 91.6  --   --   --  89.3 90.0 85.9  PLT 287  --   --   --  215 226 242   < > = values in this interval not displayed.   Basic Metabolic Panel: Recent Labs  Lab 02/06/23 0141 02/06/23 0155 02/06/23 0420 02/06/23 0850 02/06/23 1405 02/06/23 1748 02/07/23 0708 02/07/23 1746 02/08/23 0043  NA 134* 138 138 138 135  --  137  --  140  K 3.3* 3.4* 3.5 3.5 4.1  --  3.6  --  3.5  CL 105 107  --   --  109  --  110  --  108  CO2 18*  --   --    --  15*  --  19*  --  21*  GLUCOSE 184* 174*  --   --  114*  --  100*  --  132*  BUN 31* 30*  --   --  21  --  15  --  14  CREATININE 1.06* 1.00  --   --  0.86  --  0.81  --  0.83  CALCIUM 9.0  --   --   --  8.5*  --  8.3*  --  8.9  MG  --   --   --   --  1.1* 2.3 2.2 2.2  --   PHOS  --   --   --   --  1.8* 1.6* 3.0 2.1*  --    GFR: Estimated Creatinine Clearance: 49.1 mL/min (by C-G formula based on SCr of 0.83 mg/dL). Liver Function Tests: Recent Labs  Lab 02/06/23 0141 02/07/23 0708  AST 21  25  ALT 16 16  ALKPHOS 36* 27*  BILITOT 0.5 0.4  PROT 6.7 5.9*  ALBUMIN 3.3* 3.1*   No results for input(s): "LIPASE", "AMYLASE" in the last 168 hours. Recent Labs  Lab 02/06/23 0314  AMMONIA <10   Coagulation Profile: Recent Labs  Lab 02/06/23 0141  INR 1.2   CBG: Recent Labs  Lab 02/07/23 1626 02/07/23 1951 02/07/23 2333 02/08/23 0343 02/08/23 0759  GLUCAP 207* 141* 156* 132* 148*   Lipid Profile: Recent Labs    02/07/23 0708  TRIG 239*   Sepsis Labs: Recent Labs  Lab 02/06/23 0156 02/06/23 1405  LATICACIDVEN 3.0* 1.8    Recent Results (from the past 240 hour(s))  Blood culture (routine x 2)     Status: None (Preliminary result)   Collection Time: 02/06/23  2:35 AM   Specimen: BLOOD RIGHT HAND  Result Value Ref Range Status   Specimen Description BLOOD RIGHT HAND  Final   Special Requests   Final    BOTTLES DRAWN AEROBIC AND ANAEROBIC Blood Culture adequate volume   Culture   Final    NO GROWTH 1 DAY Performed at [facility], [address], [place], [place] [address]    Report Status PENDING  Incomplete  Blood culture (routine x 2)     Status: None (Preliminary result)   Collection Time: 02/06/23  2:42 AM   Specimen: BLOOD LEFT HAND  Result Value Ref Range Status   Specimen Description BLOOD LEFT HAND  Final   Special Requests   Final    BOTTLES DRAWN AEROBIC AND ANAEROBIC Blood Culture results may not be optimal due to an inadequate  volume of blood received in culture bottles   Culture   Final    NO GROWTH 1 DAY Performed at [facility], [address], [place], [place] [address]    Report Status PENDING  Incomplete  MRSA Next Gen by PCR, Nasal     Status: None   Collection Time: 02/06/23  5:21 AM   Specimen: Nasal Mucosa; Nasal Swab  Result Value Ref Range Status   MRSA by PCR Next Gen NOT DETECTED NOT DETECTED Final    Comment: (NOTE) The GeneXpert MRSA Assay (FDA approved for NASAL specimens only), is one component of a comprehensive MRSA colonization surveillance program. It is not intended to diagnose MRSA infection nor to guide or monitor treatment for MRSA infections. Test performance is not FDA approved in patients less than 13 years old. Performed at [facility], [address], [place], [place] [address]          Radiology Studies: DG Shoulder Left Port  Result Date: 02/06/2023 CLINICAL DATA:  Bruising EXAM: LEFT SHOULDER COMPARISON:  None Available. FINDINGS: No evidence of fracture or dislocation. There is no evidence of arthropathy or other focal bone abnormality. Partially visualized ETT and NG/OG tube. Soft tissues are unremarkable. IMPRESSION: No acute osseous abnormality. Electronically Signed   By: Allegra Lai M.D.   On: 02/06/2023 13:45   EEG adult  Result Date: 02/06/2023 Charlsie Quest, MD     02/06/2023  9:59 AM Patient Name: Michelle [name] MRN: [id number] Epilepsy Attending: Charlsie Quest Referring Physician/Provider: Briant Sites, DO Date: 02/06/2023 Duration: 23.55 mins Patient history: 89yo F with h/o seizures now with ams. EG to evaluate for seizure Level of alertness: lethargic, sleep AEDs during EEG study: LTG Technical aspects: This EEG study was done with scalp electrodes positioned according to the 10-20  International system of electrode placement. Electrical activity was reviewed with band pass filter of 1-70Hz , sensitivity of 7 uV/mm, display speed of  7mm/sec with a 60Hz  notched filter applied as appropriate. EEG data were recorded continuously and digitally stored.  Video monitoring was available and reviewed as appropriate. Description: EEG showed continuous/intermittent generalized polymorphic sharply contoured 3 to 6 Hz theta-delta slowing. Sleep was characterized by vertex waves, sleep spindles (12 to 14 Hz), maximal frontocentral region. Hyperventilation and photic stimulation were not performed.   ABNORMALITY - Continuous slow, generalized IMPRESSION: This study is suggestive of moderate to severe diffuse encephalopathy, nonspecific etiology. No seizures or epileptiform discharges were seen throughout the recording. Priyanka Annabelle Harman        Scheduled Meds:  Chlorhexidine Gluconate Cloth  6 each Topical Daily   diclofenac Sodium  2 g Topical QID   enoxaparin (LOVENOX) injection  30 mg Subcutaneous Q12H   feeding supplement  237 mL Oral BID BM   fluticasone furoate-vilanterol  1 puff Inhalation Daily   insulin aspart  0-9 Units Subcutaneous Q4H   lamoTRIgine  25 mg Oral BID   mouth rinse  15 mL Mouth Rinse 4 times per day   pantoprazole  40 mg Oral Daily   rosuvastatin  40 mg Oral Daily   sertraline  100 mg Oral QHS   umeclidinium bromide  1 puff Inhalation Daily   Continuous Infusions:  sodium chloride       LOS: 2 days   Time spent:  Azucena Fallen, DO [facility]  If 7PM-7AM, please contact night-coverage [email or web address]  02/08/2023, 8:12 AM

## 2023-02-08 NOTE — TOC CM/SW Note (Signed)
Transition of Care [facility]) - Inpatient Brief Assessment   Patient Details  Name: Michelle [name] MRN: [id number] Date of Birth: [birth year]  Transition of Care [facility]) CM/SW Contact:    Kermit Balo, RN Phone Number: 02/08/2023, 2:40 PM   Clinical Narrative: CIR to start insurance auth for rehab admission.   Transition of Care Asessment: Insurance and Status: Insurance coverage has been reviewed Patient has primary care physician: Yes Home environment has been reviewed: home with son   Prior/Current Home Services: No current home services Social Determinants of Health Reivew: SDOH reviewed no interventions necessary Readmission risk has been reviewed: Yes Transition of care needs: transition of care needs identified, TOC will continue to follow

## 2023-02-08 NOTE — Progress Notes (Signed)
Inpatient Rehabilitation Admissions Coordinator   We have OT eval and so will begin Auth with [facility] for possible Cir admit. I met with son, [name], at bedside and he is aware. I have asked TOC to discuss with son his questions concerning POA.  Ottie Glazier, RN, MSN Rehab Admissions Coordinator [phone] 02/08/2023 12:04 PM

## 2023-02-09 DIAGNOSIS — R569 Unspecified convulsions: Secondary | ICD-10-CM | POA: Diagnosis not present

## 2023-02-09 LAB — CBC
HCT: 36.1 % (ref 36.0–46.0)
Hemoglobin: 12 g/dL (ref 12.0–15.0)
MCH: 29.3 pg (ref 26.0–34.0)
RBC: 4.09 MIL/uL (ref 3.87–5.11)
RDW: 13.8 % (ref 11.5–15.5)
WBC: 11.7 10*3/uL — ABNORMAL HIGH (ref 4.0–10.5)
nRBC: 0 % (ref 0.0–0.2)

## 2023-02-09 LAB — GLUCOSE, CAPILLARY
Glucose-Capillary: 131 mg/dL — ABNORMAL HIGH (ref 70–99)
Glucose-Capillary: 147 mg/dL — ABNORMAL HIGH (ref 70–99)
Glucose-Capillary: 148 mg/dL — ABNORMAL HIGH (ref 70–99)
Glucose-Capillary: 150 mg/dL — ABNORMAL HIGH (ref 70–99)
Glucose-Capillary: 153 mg/dL — ABNORMAL HIGH (ref 70–99)
Glucose-Capillary: 190 mg/dL — ABNORMAL HIGH (ref 70–99)

## 2023-02-09 LAB — BASIC METABOLIC PANEL
Anion gap: 10 (ref 5–15)
BUN: 18 mg/dL (ref 8–23)
CO2: 19 mmol/L — ABNORMAL LOW (ref 22–32)
Calcium: 8.8 mg/dL — ABNORMAL LOW (ref 8.9–10.3)
Chloride: 109 mmol/L (ref 98–111)
Creatinine, Ser: 0.94 mg/dL (ref 0.44–1.00)
GFR, Estimated: 58 mL/min — ABNORMAL LOW (ref >60)
Potassium: 3.6 mmol/L (ref 3.5–5.1)
Sodium: 138 mmol/L (ref 135–145)

## 2023-02-09 LAB — CULTURE, BLOOD (ROUTINE X 2): Culture: NO GROWTH

## 2023-02-09 MED ORDER — DILTIAZEM HCL 25 MG/5ML IV SOLN
10.0000 mg | Freq: Once | INTRAVENOUS | Status: AC
  Administered 2023-02-09: 10 mg via INTRAVENOUS
  Filled 2023-02-09: qty 5

## 2023-02-09 MED ORDER — LACTATED RINGERS IV BOLUS
500.0000 mL | Freq: Once | INTRAVENOUS | Status: AC
  Administered 2023-02-09: 500 mL via INTRAVENOUS

## 2023-02-09 MED ORDER — POTASSIUM CHLORIDE IN NACL 20-0.9 MEQ/L-% IV SOLN
INTRAVENOUS | Status: DC
  Filled 2023-02-09: qty 1000

## 2023-02-09 MED ORDER — STERILE WATER FOR INJECTION IJ SOLN
INTRAMUSCULAR | Status: AC
  Filled 2023-02-09: qty 10

## 2023-02-09 MED ORDER — METOPROLOL TARTRATE 5 MG/5ML IV SOLN: 5.0000 mg | Freq: Once | INTRAVENOUS | Status: DC | PRN

## 2023-02-09 MED ORDER — BISACODYL 10 MG RE SUPP
10.0000 mg | Freq: Once | RECTAL | Status: AC
  Administered 2023-02-09: 10 mg via RECTAL
  Filled 2023-02-09: qty 1

## 2023-02-09 NOTE — Progress Notes (Signed)
Inpatient Rehabilitation Admissions Coordinator   I have received a denial from [facility] for Cir admit. I met at bedside with son, [name], at bedside. We are appealing that determination.  Ottie Glazier, RN, MSN Rehab Admissions Coordinator [phone] 02/09/2023 11:40 AM

## 2023-02-09 NOTE — Progress Notes (Signed)
Physical Therapy Treatment Patient Details Name: Michelle [name] MRN: [id number] DOB: [birth year] Today's Date: 02/09/2023   History of Present Illness 89 y.o. female presents to [facility] hospital on 02/06/2023 after having a seizure, falling and striking her head. Pt intubated in the ED, extubated same date. PMH includes HTN, seizures, DMII, HLD, dementia, CAD, COPD.    PT Comments  Patient resting in bed and 2 sons present, pt agreeable to therapy visit. Mod verbal cues needed to encourage initiation of supine>sit and min assist to raise trunk and pivot to EOB this session. Min +2 assist for sit<>stands from EOB with RW. Pt completed bed<>BSC with Mod +2 fading to Min +2 assist with improved weight shifting to take small side steps. Pt remains slightly confused and disoriented to situation and place throughout visit, and reporting visual hallucinations this session. Overall had improved alertness keeping eyes open while talking with therapist and engaging in conversation to re-orient and joke with sons and therapist. EOS pt return to bed due to fatigue and repositioned for comfort. Will continue to progress pt as able.    Assistance Recommended at Discharge Frequent or constant Supervision/Assistance  If plan is discharge home, recommend the following:  Can travel by private vehicle    Two people to help with walking and/or transfers;Assistance with cooking/housework;Two people to help with bathing/dressing/bathroom;Direct supervision/assist for medications management;Direct supervision/assist for financial management;Assist for transportation;Help with stairs or ramp for entrance      Equipment Recommendations  Other (comment) (defer)    Recommendations for Other Services       Precautions / Restrictions Precautions Precautions: Fall Precaution Comments: seizure, wrist restraints, mitts Restrictions Weight Bearing Restrictions: No     Mobility  Bed Mobility Overal bed mobility: Needs  Assistance Bed Mobility: Supine to Sit     Supine to sit: Min assist, Mod assist, HOB elevated Sit to supine: Mod assist, +2 for safety/equipment   General bed mobility comments: Mod verbal cues for pt to initiate pivot to EOB and to bring LE's off side. Min assist with pt reaching for therapist hand to pull trunk up, pt using therapist like grab bar. Mod assist to bring LE's back onto bed and lower trunk.    Transfers Overall transfer level: Needs assistance Equipment used: Rolling walker (2 wheels), 2 person hand held assist Transfers: Sit to/from Stand, Bed to chair/wheelchair/BSC Sit to Stand: Min assist, +2 physical assistance, +2 safety/equipment Stand pivot transfers: +2 physical assistance, Min assist, +2 safety/equipment         General transfer comment: min assist for sit<>stand from EOB with +2 for safety. pt using bil UE on RW on first stand. sinlge UE on EOB for power up on second rise and mod assist to guide RW to pivot bed>BSC. Pt with improved weight shifting to step back towards EOB, min assist +2 to return.    Ambulation/Gait                   Stairs             Wheelchair Mobility     Tilt Bed    Modified Rankin (Stroke Patients Only)       Balance Overall balance assessment: Needs assistance Sitting-balance support: Feet supported Sitting balance-Leahy Scale: Fair     Standing balance support: Bilateral upper extremity supported Standing balance-Leahy Scale: Poor  Cognition Arousal/Alertness: Awake/alert Behavior During Therapy: Restless Overall Cognitive Status: Impaired/Different from baseline Area of Impairment: Orientation, Attention, Memory, Following commands, Safety/judgement, Awareness, Problem solving                 Orientation Level: Disoriented to, Place, Time, Situation Current Attention Level: Sustained, Focused Memory: Decreased short-term memory Following Commands:  Follows one step commands with increased time Safety/Judgement: Decreased awareness of safety, Decreased awareness of deficits Awareness: Intellectual Problem Solving: Difficulty sequencing, Requires verbal cues, Requires tactile cues, Slow processing General Comments: Pt A&O to self, but thinks she is at home and reporting visual hallucinations of flowers on wall and dogs and animals with bones on the floor. pt repsonds well to jokes. able to recognize her sons today. Initially thinks she is at home and requries reorienting to [facility] several times during session.        Exercises      General Comments        Pertinent Vitals/Pain Pain Assessment Pain Assessment: Faces Faces Pain Scale: Hurts little more Pain Location: generalized with movement and headache Pain Descriptors / Indicators: Grimacing, Discomfort, Headache Pain Intervention(s): Limited activity within patient's tolerance, Monitored during session, Repositioned    Home Living                          Prior Function            PT Goals (current goals can now be found in the care plan section) Acute Rehab PT Goals Patient Stated Goal: did not state PT Goal Formulation: With patient Time For Goal Achievement: 02/20/23 Potential to Achieve Goals: Good Progress towards PT goals: Progressing toward goals    Frequency    Min 1X/week      PT Plan Current plan remains appropriate    Co-evaluation              AM-PAC PT "6 Clicks" Mobility   Outcome Measure  Help needed turning from your back to your side while in a flat bed without using bedrails?: A Lot Help needed moving from lying on your back to sitting on the side of a flat bed without using bedrails?: A Lot Help needed moving to and from a bed to a chair (including a wheelchair)?: A Lot Help needed standing up from a chair using your arms (e.g., wheelchair or bedside chair)?: A Lot Help needed to walk in hospital room?: Total Help needed  climbing 3-5 steps with a railing? : Total 6 Click Score: 10    End of Session Equipment Utilized During Treatment: Gait belt Activity Tolerance: Patient tolerated treatment well Patient left: in chair;with call bell/phone within reach;with chair alarm set;with restraints reapplied;with family/visitor present Nurse Communication: Mobility status PT Visit Diagnosis: Other abnormalities of gait and mobility (R26.89);Muscle weakness (generalized) (M62.81);Other symptoms and signs involving the nervous system (R29.898)     Time: 1337-1410 PT Time Calculation (min) (ACUTE ONLY): 33 min  Charges:    $Therapeutic Activity: 23-37 mins PT General Charges $$ ACUTE PT VISIT: 1 Visit                     Wynn Maudlin, DPT Acute Rehabilitation Services Office [phone]  02/09/23 4:06 PM

## 2023-02-09 NOTE — Progress Notes (Signed)
PROGRESS NOTE    Michelle [name]  QQV:[id number] DOB: 1932-09-04 DOA: 02/06/2023 PCP: Shelva Majestic, MD   Brief Narrative:  Patient is an 89 year old female with past medical history of hypertension, diabetes type 2, hyperlipidemia, dementia, coronary disease, COPD with 55-pack-year smoking history presenting as a trauma after seizure while falling from a seated position on the toilet.  Seizure appears of lasted approximately 2 minutes, patient markedly confused postevent combative with EMS requiring Versed.  Patient ultimately intubated due to combative nature and concern for trauma.  PCCM and trauma teams consulted initially for admission.  Patient's acute metabolic encephalopathy continues to improve slowly, no further episodes of seizures at this time but no clear etiology for seizure provocation either.  Patient ultimately extubated 7/22, resumed on home Lamictal 25 twice daily.  Patient transition to general medical floor with improvement daily but quite slow.  Continues to be agitated difficult to orient, likely some underlying hospital delirium as well.  Occasionally requiring restraints and mittens to protect lines and telemetry.  Patient more awake alert today with family at bedside attempting to feed herself breakfast.  Overnight 7/25 patient noted to transition to A-fib with transient RVR now back to sinus rhythm this morning.  Assessment & Plan:   Principal Problem:   Seizure (HCC) Active Problems:   Altered mental status   Scalp laceration   Acute metabolic encephalopathy, secondary to presumed breakthrough seizure Complicated by underlying dementia, trauma and head laceration -Unclear etiology of breakthrough seizure, continue patient on home Lamictal per neurology -Trauma initially following, appears stable, continue wound care over scalp laceration -Patient remains seizure-free per EEG -Hold any further CNS depressing meds, follow clinically (continue to wean off  oxycodone as possible in the setting of trauma) -Zyprexa as needed for agitation overnight, delirium precautions ongoing  Acute hypoxic resp failure requiring mechanical ventilation Rule out aspiration pneumonitis COPD, questionably in exacerbation:  Continue on advair and spiriva patient's home medications 55-pack-year smoking history, quit smoking 2008 Lung nodule measure up to 2 cm Extubated to room air Currently 3 L nasal cannula Acute event 7/24 morning likely aspiration of breakfast with transient respiratory distress resolving, no further complaints of dyspnea.  Speech continues to follow given her high risk of aspiration -breo and incruse ellipta; prn duoneb for wheezing -will need repeat CT in 3 months for lung nodule -defer to primary pulmonologist for his PCP   Acute transient episode of A-fib with RVR, likely provoked, resolved -Complicated by above, new stroke, hypoxia, agitation confusion sundowning place patient at high risk for event -Sinus rhythm on EKG overnight and again this morning, Cardizem x 1 given -hold anticoagulation given transient event -May benefit from Holter monitor at discharge  Acute metabolic acidosis Lactic acidosis, resolved -Resolved, continue to follow labs, bicarb trending up appropriately   Hypotension History of hypertension -Without shock or need for pressors -Hold sedating medications, hold amlodipine, carvedilol, enalapril, hydralazine, furosemide per home med rec -Blood pressure remains labile in the setting of agitation and sedation, follow closely, continues on telemetry  Past medical history, stable CAD Hyperlipidemia -hold asa and plavix -Hold antihypertensives as above due to hypotension   Multi-septated cystic ovarian neoplasm -will need to f/u outpt w/ GYN (will likely need hysterectomy)   T2DM, well-controlled -Well-controlled A1c 5.5 continue sliding scale insulin hypoglycemic protocol   Depression -Continue home  sertraline, hold nortriptyline  DVT prophylaxis: SCDs Start: 02/06/23 0402 Code Status:   Code Status: Full Code Family Communication: Son at bedside  Status is:  Inpatient  Dispo: The patient is from: Home              Anticipated d/c is to: To be determined              Anticipated d/c date is: 72+ hours              Patient currently not medically stable for discharge  Consultants:  PCCM, neurology, trauma  Procedures:  None  Antimicrobials:  None indicated  Subjective: Overnight transient tachyarrhythmia rates in the 140s reported to be A-fib, EKG within normal limits, patient appears to have been asymptomatic during the event.  Patient otherwise denies nausea vomiting diarrhea constipation headache fevers chills or chest pain although limited review of systems given patient's mental status  Objective: Vitals:   02/09/23 0500 02/09/23 0814 02/09/23 1151 02/09/23 1551  BP:  130/80 122/67 132/78  Pulse:  (!) 112 (!) 106 (!) 102  Resp:  (!) 24 (!) 22 (!) 21  Temp:  98 F (36.7 C) 98.6 F (37 C) 98.6 F (37 C)  TempSrc:  Oral Axillary Oral  SpO2:  100% 98% 100%  Weight: 78.4 kg     Height:        Intake/Output Summary (Last 24 hours) at 02/09/2023 1627 Last data filed at 02/09/2023 1454 Gross per 24 hour  Intake 1419.11 ml  Output 1710 ml  Net -290.89 ml   Filed Weights   02/07/23 1532 02/08/23 0548 02/09/23 0500  Weight: 79.6 kg 80.6 kg 78.4 kg    Examination:  General:  Pleasantly resting in bed, No acute distress. HEENT: Head laceration, superficial over the right parietal area Neck:  Without mass or deformity.  Trachea is midline. Lungs:  Clear to auscultate bilaterally without rhonchi, wheeze, or rales. Heart:  Regular rate and rhythm.  Without murmurs, rubs, or gallops. Abdomen:  Soft, nontender, nondistended.  Without guarding or rebound. Extremities: Without cyanosis, clubbing, edema, or obvious deformity. Skin:  Warm and dry, no erythema.  Data  Reviewed: I have personally reviewed following labs and imaging studies  CBC: Recent Labs  Lab 02/06/23 0141 02/06/23 0155 02/06/23 0850 02/06/23 1405 02/07/23 0708 02/08/23 0043 02/09/23 0259  WBC 8.3  --   --  12.6* 7.3 9.9 11.7*  NEUTROABS  --   --   --   --  4.9  --   --   HGB 11.4*   < > 10.5* 10.4* 9.5* 9.8* 12.0  HCT 35.8*   < > 31.0* 31.0* 28.8* 29.2* 36.1  MCV 91.6  --   --  89.3 90.0 85.9 88.3  PLT 287  --   --  215 226 242 238   < > = values in this interval not displayed.   Basic Metabolic Panel: Recent Labs  Lab 02/06/23 0141 02/06/23 0155 02/06/23 0420 02/06/23 0850 02/06/23 1405 02/06/23 1748 02/07/23 0708 02/07/23 1746 02/08/23 0043 02/09/23 0259  NA 134* 138   < > 138 135  --  137  --  140 138  K 3.3* 3.4*   < > 3.5 4.1  --  3.6  --  3.5 3.6  CL 105 107  --   --  109  --  110  --  108 109  CO2 18*  --   --   --  15*  --  19*  --  21* 19*  GLUCOSE 184* 174*  --   --  114*  --  100*  --  132* 126*  BUN 31* 30*  --   --  21  --  15  --  14 18  CREATININE 1.06* 1.00  --   --  0.86  --  0.81  --  0.83 0.94  CALCIUM 9.0  --   --   --  8.5*  --  8.3*  --  8.9 8.8*  MG  --   --   --   --  1.1* 2.3 2.2 2.2  --   --   PHOS  --   --   --   --  1.8* 1.6* 3.0 2.1*  --   --    < > = values in this interval not displayed.   GFR: Estimated Creatinine Clearance: 42.8 mL/min (by C-G formula based on SCr of 0.94 mg/dL). Liver Function Tests: Recent Labs  Lab 02/06/23 0141 02/07/23 0708  AST 21 25  ALT 16 16  ALKPHOS 36* 27*  BILITOT 0.5 0.4  PROT 6.7 5.9*  ALBUMIN 3.3* 3.1*    Recent Labs  Lab 02/06/23 0314  AMMONIA <10   Coagulation Profile: Recent Labs  Lab 02/06/23 0141  INR 1.2   CBG: Recent Labs  Lab 02/08/23 2348 02/09/23 0336 02/09/23 0811 02/09/23 1149 02/09/23 1621  GLUCAP 152* 131* 153* 190* 150*   Lipid Profile: Recent Labs    02/07/23 0708  TRIG 239*   Sepsis Labs: Recent Labs  Lab 02/06/23 0156 02/06/23 1405   LATICACIDVEN 3.0* 1.8    Recent Results (from the past 240 hour(s))  Blood culture (routine x 2)     Status: None (Preliminary result)   Collection Time: 02/06/23  2:35 AM   Specimen: BLOOD RIGHT HAND  Result Value Ref Range Status   Specimen Description BLOOD RIGHT HAND  Final   Special Requests   Final    BOTTLES DRAWN AEROBIC AND ANAEROBIC Blood Culture adequate volume   Culture   Final    NO GROWTH 3 DAYS Performed at [facility], [address] [place], [place] [address]    Report Status PENDING  Incomplete  Blood culture (routine x 2)     Status: None (Preliminary result)   Collection Time: 02/06/23  2:42 AM   Specimen: BLOOD LEFT HAND  Result Value Ref Range Status   Specimen Description BLOOD LEFT HAND  Final   Special Requests   Final    BOTTLES DRAWN AEROBIC AND ANAEROBIC Blood Culture results may not be optimal due to an inadequate volume of blood received in culture bottles   Culture   Final    NO GROWTH 3 DAYS Performed at [facility], [address] [place], [place] [address]    Report Status PENDING  Incomplete  MRSA Next Gen by PCR, Nasal     Status: None   Collection Time: 02/06/23  5:21 AM   Specimen: Nasal Mucosa; Nasal Swab  Result Value Ref Range Status   MRSA by PCR Next Gen NOT DETECTED NOT DETECTED Final    Comment: (NOTE) The GeneXpert MRSA Assay (FDA approved for NASAL specimens only), is one component of a comprehensive MRSA colonization surveillance program. It is not intended to diagnose MRSA infection nor to guide or monitor treatment for MRSA infections. Test performance is not FDA approved in patients less than 19 years old. Performed at [facility], [address] [place], [place] [address]   Urine Culture (for pregnant, neutropenic or urologic patients or patients with an indwelling urinary catheter)  Status: None   Collection Time: 02/07/23  8:03 AM   Specimen: Urine, Clean Catch  Result Value Ref Range  Status   Specimen Description URINE, CLEAN CATCH  Final   Special Requests NONE  Final   Culture   Final    NO GROWTH Performed at [facility], [address] [place], [place] [address]    Report Status 02/08/2023 FINAL  Final         Radiology Studies: DG CHEST PORT 1 VIEW  Result Date: 02/08/2023 CLINICAL DATA:  Hypoxia. EXAM: PORTABLE CHEST 1 VIEW COMPARISON:  Chest CT 02/06/2023. Radiographs 02/06/2023 and 01/16/2023. FINDINGS: 1008 hours. Interval extubation and removal of the enteric tube. The heart size and mediastinal contours are stable with aortic atherosclerosis. The overall pulmonary aeration has mildly improved. Unchanged interstitial prominence and patchy opacities at both lung bases. No confluent airspace disease, pneumothorax or significant pleural effusion. The bones appear unchanged. IMPRESSION: Mildly improved pulmonary aeration following extubation. No acute cardiopulmonary process. Electronically Signed   By: Carey Bullocks M.D.   On: 02/08/2023 14:00        Scheduled Meds:  diclofenac Sodium  2 g Topical QID   feeding supplement  237 mL Oral BID BM   fluticasone furoate-vilanterol  1 puff Inhalation Daily   insulin aspart  0-9 Units Subcutaneous Q4H   lamoTRIgine  25 mg Oral BID   nystatin  5 mL Oral QID   mouth rinse  15 mL Mouth Rinse 4 times per day   pantoprazole  40 mg Oral Daily   rosuvastatin  40 mg Oral Daily   sertraline  100 mg Oral QHS   umeclidinium bromide  1 puff Inhalation Daily   Continuous Infusions:  sodium chloride     0.9 % NaCl with KCl 20 mEq / L Stopped (02/09/23 0935)     LOS: 3 days   Time spent:  Azucena Fallen, DO [facility]  If 7PM-7AM, please contact night-coverage [email or web address]  02/09/2023, 4:27 PM

## 2023-02-09 NOTE — Care Management Important Message (Signed)
Important Message  Patient Details  Name: Michelle [name] MRN: [id number] Date of Birth: 1933-05-24   Medicare Important Message Given:  Yes     Dorena Bodo 02/09/2023, 2:34 PM

## 2023-02-09 NOTE — Progress Notes (Deleted)
Patient went into A-fib with RVR overnight, no prior history of.  Respecters CAD.  TSH ordered.  2D echo in a.m., Cardizem 10 mg IV x 1 ordered, will transition to drip if needed. CHADVasc >2.  Will initiate heparin drip.  No initial subsequent boluses ordered.  As patient already on Plavix and aspirin.  MRI head without evidence of intracranial bleed

## 2023-02-09 NOTE — Plan of Care (Signed)
  Problem: Education: Goal: Ability to describe self-care measures that may prevent or decrease complications (Diabetes Survival Skills Education) will improve Outcome: Progressing Goal: Individualized Educational Video(s) Outcome: Progressing   Problem: Coping: Goal: Ability to adjust to condition or change in health will improve Outcome: Progressing   Problem: Fluid Volume: Goal: Ability to maintain a balanced intake and output will improve Outcome: Progressing   Problem: Health Behavior/Discharge Planning: Goal: Ability to identify and utilize available resources and services will improve Outcome: Progressing Goal: Ability to manage health-related needs will improve Outcome: Progressing   Problem: Metabolic: Goal: Ability to maintain appropriate glucose levels will improve Outcome: Progressing   Problem: Nutritional: Goal: Maintenance of adequate nutrition will improve Outcome: Progressing Goal: Progress toward achieving an optimal weight will improve Outcome: Progressing   Problem: Skin Integrity: Goal: Risk for impaired skin integrity will decrease Outcome: Progressing   Problem: Tissue Perfusion: Goal: Adequacy of tissue perfusion will improve Outcome: Progressing   Problem: Safety: Goal: Non-violent Restraint(s) Outcome: Progressing   Problem: Education: Goal: Knowledge of General Education information will improve Description: Including pain rating scale, medication(s)/side effects and non-pharmacologic comfort measures Outcome: Progressing   Problem: Health Behavior/Discharge Planning: Goal: Ability to manage health-related needs will improve Outcome: Progressing   Problem: Clinical Measurements: Goal: Ability to maintain clinical measurements within normal limits will improve Outcome: Progressing Goal: Will remain free from infection Outcome: Progressing Goal: Diagnostic test results will improve Outcome: Progressing Goal: Respiratory complications  will improve Outcome: Progressing Goal: Cardiovascular complication will be avoided Outcome: Progressing   Problem: Activity: Goal: Risk for activity intolerance will decrease Outcome: Progressing   Problem: Nutrition: Goal: Adequate nutrition will be maintained Outcome: Progressing   Problem: Coping: Goal: Level of anxiety will decrease Outcome: Progressing   Problem: Elimination: Goal: Will not experience complications related to bowel motility Outcome: Progressing Goal: Will not experience complications related to urinary retention Outcome: Progressing   Problem: Pain Managment: Goal: General experience of comfort will improve Outcome: Progressing   Problem: Safety: Goal: Ability to remain free from injury will improve Outcome: Progressing   Problem: Skin Integrity: Goal: Risk for impaired skin integrity will decrease Outcome: Progressing   

## 2023-02-09 NOTE — Progress Notes (Signed)
Dr. Joneen Roach was made aware that pt went into afib with HR 140's and BP= 136/77. EKG taken. Rapid response was called to administer cardizem.

## 2023-02-09 NOTE — Plan of Care (Signed)
  Problem: Coping: Goal: Ability to adjust to condition or change in health will improve Outcome: Progressing   Problem: Fluid Volume: Goal: Ability to maintain a balanced intake and output will improve Outcome: Progressing   Problem: Health Behavior/Discharge Planning: Goal: Ability to identify and utilize available resources and services will improve Outcome: Progressing Goal: Ability to manage health-related needs will improve Outcome: Progressing   Problem: Metabolic: Goal: Ability to maintain appropriate glucose levels will improve Outcome: Progressing   Problem: Nutritional: Goal: Maintenance of adequate nutrition will improve Outcome: Progressing

## 2023-02-09 NOTE — Progress Notes (Addendum)
Dr. Natale Milch notified of vaginal bleeding/discharge   02/09/23 1723  GU Assessment  Genitourinary (WDL) WDL  Genitalia  Female Genitalia Discharge  Drainage Description Dark Red;Moderate       02/09/23 1723  Complaints & Interventions  Constipation interventions Stool Softener  Provider Notification  Provider Name/Title Dr. Natale Milch  Date Provider Notified 02/09/23  Time Provider Notified 1723  Method of Notification  (SecureChat)  Notification Reason Other (Comment) (Vaginal bleeding)  Provider response Evaluate remotely;See new orders  Date of Provider Response 02/09/23  Time of Provider Response 1724

## 2023-02-10 DIAGNOSIS — R569 Unspecified convulsions: Secondary | ICD-10-CM | POA: Diagnosis not present

## 2023-02-10 LAB — GLUCOSE, CAPILLARY
Glucose-Capillary: 142 mg/dL — ABNORMAL HIGH (ref 70–99)
Glucose-Capillary: 139 mg/dL — ABNORMAL HIGH (ref 70–99)
Glucose-Capillary: 205 mg/dL — ABNORMAL HIGH (ref 70–99)
Glucose-Capillary: 163 mg/dL — ABNORMAL HIGH (ref 70–99)
Glucose-Capillary: 161 mg/dL — ABNORMAL HIGH (ref 70–99)
Glucose-Capillary: 193 mg/dL — ABNORMAL HIGH (ref 70–99)

## 2023-02-10 LAB — BASIC METABOLIC PANEL: Sodium: 140 mmol/L (ref 135–145)

## 2023-02-10 MED ORDER — INSULIN ASPART 100 UNIT/ML IJ SOLN
0.0000 [IU] | Freq: Every day | INTRAMUSCULAR | Status: DC
  Administered 2023-02-11 – 2023-02-15 (×2): 2 [IU] via SUBCUTANEOUS

## 2023-02-10 MED ORDER — INSULIN ASPART 100 UNIT/ML IJ SOLN: 7.0000 [IU] | Freq: Once | INTRAMUSCULAR | Status: DC

## 2023-02-10 MED ORDER — INSULIN ASPART 100 UNIT/ML IJ SOLN
0.0000 [IU] | Freq: Three times a day (TID) | INTRAMUSCULAR | Status: DC
  Administered 2023-02-10: 4 [IU] via SUBCUTANEOUS
  Administered 2023-02-11: 3 [IU] via SUBCUTANEOUS
  Administered 2023-02-11: 4 [IU] via SUBCUTANEOUS
  Administered 2023-02-11 – 2023-02-12 (×2): 7 [IU] via SUBCUTANEOUS
  Administered 2023-02-12 – 2023-02-13 (×3): 4 [IU] via SUBCUTANEOUS
  Administered 2023-02-13 – 2023-02-14 (×2): 7 [IU] via SUBCUTANEOUS
  Administered 2023-02-14: 11 [IU] via SUBCUTANEOUS
  Administered 2023-02-14: 4 [IU] via SUBCUTANEOUS
  Administered 2023-02-15: 11 [IU] via SUBCUTANEOUS
  Administered 2023-02-15 – 2023-02-16 (×4): 4 [IU] via SUBCUTANEOUS
  Administered 2023-02-16: 15 [IU] via SUBCUTANEOUS
  Administered 2023-02-17 (×3): 4 [IU] via SUBCUTANEOUS
  Administered 2023-02-18: 7 [IU] via SUBCUTANEOUS
  Administered 2023-02-18: 3 [IU] via SUBCUTANEOUS

## 2023-02-10 MED ORDER — ACETAMINOPHEN 325 MG PO TABS
650.0000 mg | ORAL_TABLET | Freq: Four times a day (QID) | ORAL | Status: DC | PRN
  Administered 2023-02-10 – 2023-02-17 (×5): 650 mg via ORAL
  Filled 2023-02-10 (×6): qty 2

## 2023-02-10 MED ORDER — INSULIN ASPART 100 UNIT/ML IJ SOLN: 0.0000 [IU] | Freq: Three times a day (TID) | INTRAMUSCULAR | Status: DC

## 2023-02-10 MED ORDER — INSULIN ASPART 100 UNIT/ML IJ SOLN
4.0000 [IU] | Freq: Once | INTRAMUSCULAR | Status: AC
  Administered 2023-02-10: 4 [IU] via SUBCUTANEOUS

## 2023-02-10 NOTE — Progress Notes (Signed)
PROGRESS NOTE    Michelle [name]  DGL:[id number] DOB: 03-02-1933 DOA: 02/06/2023 PCP: Shelva Majestic, MD   Brief Narrative:  Patient is an 89 year old female with past medical history of hypertension, diabetes type 2, hyperlipidemia, dementia, coronary disease, COPD with 55-pack-year smoking history presenting as a trauma after seizure while falling from a seated position on the toilet.  Seizure appears of lasted approximately 2 minutes, patient markedly confused postevent combative with EMS requiring Versed.  Patient ultimately intubated due to combative nature and concern for trauma.  PCCM and trauma teams consulted initially for admission.  Patient's acute metabolic encephalopathy continues to improve slowly, no further episodes of seizures at this time but no clear etiology for seizure provocation either.  Patient ultimately extubated 7/22, resumed on home Lamictal 25 twice daily.  Patient transition to general medical floor with improvement daily but quite slow.  Continues to be agitated difficult to orient, likely some underlying hospital delirium as well.  Occasionally requiring restraints and mittens to protect lines and telemetry.  Patient more awake alert today with family at bedside attempting to feed herself breakfast. Overnight 7/25 patient noted to transition to A-fib with transient RVR now back to sinus rhythm this morning. Morning of the 26th patient is markedly improved alert oriented to person place situation and time, only orienting question incorrect was year which son states would not be abnormal for her.  At this time patient remains medically stable for discharge, plan to transfer patient to inpatient rehab, insurance currently denied patient's request, rehab is appealing their decision based on patient's improved mental status and strength I feel it is certainly appropriate and reasonable for patient to transfer to inpatient rehab for ongoing medical care.  Assessment &  Plan:   Principal Problem:   Seizure (HCC) Active Problems:   Altered mental status   Scalp laceration  Acute metabolic encephalopathy, secondary to presumed breakthrough seizure, resolving Complicated by underlying dementia, trauma and head laceration -Unclear etiology of breakthrough seizure, continue patient on home Lamictal per neurology -Trauma initially following, appears stable, continue wound care over scalp laceration -Patient remains seizure-free per EEG -Hold any further CNS depressing meds, follow clinically (continue to wean off oxycodone as possible in the setting of trauma) -Zyprexa as needed for agitation overnight, delirium precautions ongoing -Mental status markedly improved overnight, ANO x 3, feeding herself in bedside chair speaking coherently with family at bedside  Acute hypoxic resp failure requiring mechanical ventilation Rule out aspiration pneumonitis COPD, questionably in exacerbation:  Continue on advair and spiriva patient's home medications 55-pack-year smoking history, quit smoking 2008 Lung nodule measure up to 2 cm Currently on room air at rest - ambulatory oxygen screen pending Acute event 7/24 morning likely aspiration of breakfast with transient respiratory distress - now on room air -breo and incruse ellipta; prn duoneb for wheezing -will need repeat CT in 3 months for lung nodule -defer to primary pulmonologist for his PCP   Acute transient episode of A-fib with RVR, likely provoked, resolved -Complicated by above, new stroke, hypoxia, agitation confusion sundowning place patient at high risk for event -Sinus rhythm on EKG overnight and again this morning, Cardizem x 1 given -hold anticoagulation given transient event -May benefit from Holter monitor at discharge  Acute metabolic acidosis Lactic acidosis, resolved -Resolved, continue to follow labs, bicarb trending up appropriately   Hypotension History of hypertension -Without shock or  need for pressors -Hold sedating medications, hold amlodipine, carvedilol, enalapril, hydralazine, furosemide per home med rec -Blood pressure  remains labile in the setting of agitation and sedation, follow closely, continues on telemetry  Past medical history, stable CAD Hyperlipidemia -hold asa and plavix -Hold antihypertensives as above due to hypotension   Multi-septated cystic ovarian neoplasm -will need to f/u outpt w/ GYN (will likely need hysterectomy)   T2DM, well-controlled -Well-controlled A1c 5.5 continue sliding scale insulin hypoglycemic protocol   Depression -Continue home sertraline, hold nortriptyline  DVT prophylaxis: SCDs Start: 02/06/23 0402 Code Status:   Code Status: Full Code Family Communication: Son at bedside  Status is: Inpatient  Dispo: The patient is from: Home              Anticipated d/c is to: CIR              Anticipated d/c date is: Imminent              Patient currently IS medically stable for discharge  Consultants:  PCCM, neurology, trauma  Procedures:  None  Antimicrobials:  None indicated  Subjective: Overnight patient is vital status improved drastically, awake alert oriented today feeding herself at bedside, requesting discharge to rehab which is certainly reasonable.  Otherwise review of systems is unremarkable  Objective: Vitals:   02/09/23 1934 02/09/23 2358 02/10/23 0402 02/10/23 0500  BP: (!) 144/74 (!) 108/57 125/63   Pulse: (!) 108 97 (!) 101   Resp:   16   Temp: 97.9 F (36.6 C) 98.7 F (37.1 C) 98.4 F (36.9 C)   TempSrc: Axillary Oral Oral   SpO2: 98% 100% 95%   Weight:    82.6 kg  Height:        Intake/Output Summary (Last 24 hours) at 02/10/2023 0749 Last data filed at 02/09/2023 1454 Gross per 24 hour  Intake 796.47 ml  Output 760 ml  Net 36.47 ml   Filed Weights   02/08/23 0548 02/09/23 0500 02/10/23 0500  Weight: 80.6 kg 78.4 kg 82.6 kg    Examination:  General:  Pleasantly sitting up in  bedside chair eating breakfast on her own alert oriented x 4 HEENT: Head laceration, superficial over the right parietal area Neck:  Without mass or deformity.  Trachea is midline. Lungs:  Clear to auscultate bilaterally without rhonchi, wheeze, or rales. Heart:  Regular rate and rhythm.  Without murmurs, rubs, or gallops. Abdomen:  Soft, nontender, nondistended.  Without guarding or rebound. Extremities: Without cyanosis, clubbing, edema, or obvious deformity. Skin:  Warm and dry, no erythema.  Data Reviewed: I have personally reviewed following labs and imaging studies  CBC: Recent Labs  Lab 02/06/23 1405 02/07/23 0708 02/08/23 0043 02/09/23 0259 02/10/23 0604  WBC 12.6* 7.3 9.9 11.7* 8.8  NEUTROABS  --  4.9  --   --   --   HGB 10.4* 9.5* 9.8* 12.0 9.8*  HCT 31.0* 28.8* 29.2* 36.1 30.9*  MCV 89.3 90.0 85.9 88.3 90.1  PLT 215 226 242 238 241   Basic Metabolic Panel: Recent Labs  Lab 02/06/23 1405 02/06/23 1748 02/07/23 0708 02/07/23 1746 02/08/23 0043 02/09/23 0259 02/10/23 0604  NA 135  --  137  --  140 138 140  K 4.1  --  3.6  --  3.5 3.6 3.8  CL 109  --  110  --  108 109 107  CO2 15*  --  19*  --  21* 19* 23  GLUCOSE 114*  --  100*  --  132* 126* 146*  BUN 21  --  15  --  14 18  17  CREATININE 0.86  --  0.81  --  0.83 0.94 0.83  CALCIUM 8.5*  --  8.3*  --  8.9 8.8* 8.6*  MG 1.1* 2.3 2.2 2.2  --   --   --   PHOS 1.8* 1.6* 3.0 2.1*  --   --   --    GFR: Estimated Creatinine Clearance: 49.7 mL/min (by C-G formula based on SCr of 0.83 mg/dL). Liver Function Tests: Recent Labs  Lab 02/06/23 0141 02/07/23 0708  AST 21 25  ALT 16 16  ALKPHOS 36* 27*  BILITOT 0.5 0.4  PROT 6.7 5.9*  ALBUMIN 3.3* 3.1*    Recent Labs  Lab 02/06/23 0314  AMMONIA <10   Coagulation Profile: Recent Labs  Lab 02/06/23 0141  INR 1.2   CBG: Recent Labs  Lab 02/09/23 1149 02/09/23 1621 02/09/23 1953 02/09/23 2355 02/10/23 0317  GLUCAP 190* 150* 148* 147* 142*    Lipid Profile: Recent Labs    02/10/23 0604  TRIG 197*   Sepsis Labs: Recent Labs  Lab 02/06/23 0156 02/06/23 1405  LATICACIDVEN 3.0* 1.8    Recent Results (from the past 240 hour(s))  Blood culture (routine x 2)     Status: None (Preliminary result)   Collection Time: 02/06/23  2:35 AM   Specimen: BLOOD RIGHT HAND  Result Value Ref Range Status   Specimen Description BLOOD RIGHT HAND  Final   Special Requests   Final    BOTTLES DRAWN AEROBIC AND ANAEROBIC Blood Culture adequate volume   Culture   Final    NO GROWTH 3 DAYS Performed at [facility], [address], [place], [place] [address]    Report Status PENDING  Incomplete  Blood culture (routine x 2)     Status: None (Preliminary result)   Collection Time: 02/06/23  2:42 AM   Specimen: BLOOD LEFT HAND  Result Value Ref Range Status   Specimen Description BLOOD LEFT HAND  Final   Special Requests   Final    BOTTLES DRAWN AEROBIC AND ANAEROBIC Blood Culture results may not be optimal due to an inadequate volume of blood received in culture bottles   Culture   Final    NO GROWTH 3 DAYS Performed at [facility], [address] [place], [place] [address]    Report Status PENDING  Incomplete  MRSA Next Gen by PCR, Nasal     Status: None   Collection Time: 02/06/23  5:21 AM   Specimen: Nasal Mucosa; Nasal Swab  Result Value Ref Range Status   MRSA by PCR Next Gen NOT DETECTED NOT DETECTED Final    Comment: (NOTE) The GeneXpert MRSA Assay (FDA approved for NASAL specimens only), is one component of a comprehensive MRSA colonization surveillance program. It is not intended to diagnose MRSA infection nor to guide or monitor treatment for MRSA infections. Test performance is not FDA approved in patients less than 77 years old. Performed at [facility], [address] [place], [place] [address]   Urine Culture (for pregnant, neutropenic or urologic patients or patients with an indwelling  urinary catheter)     Status: None   Collection Time: 02/07/23  8:03 AM   Specimen: Urine, Clean Catch  Result Value Ref Range Status   Specimen Description URINE, CLEAN CATCH  Final   Special Requests NONE  Final   Culture   Final    NO GROWTH Performed at [facility], [address]  [place], [place] [address]    Report Status 02/08/2023 FINAL  Final         Radiology Studies: DG CHEST PORT 1 VIEW  Result Date: 02/08/2023 CLINICAL DATA:  Hypoxia. EXAM: PORTABLE CHEST 1 VIEW COMPARISON:  Chest CT 02/06/2023. Radiographs 02/06/2023 and 01/16/2023. FINDINGS: 1008 hours. Interval extubation and removal of the enteric tube. The heart size and mediastinal contours are stable with aortic atherosclerosis. The overall pulmonary aeration has mildly improved. Unchanged interstitial prominence and patchy opacities at both lung bases. No confluent airspace disease, pneumothorax or significant pleural effusion. The bones appear unchanged. IMPRESSION: Mildly improved pulmonary aeration following extubation. No acute cardiopulmonary process. Electronically Signed   By: Carey Bullocks M.D.   On: 02/08/2023 14:00        Scheduled Meds:  diclofenac Sodium  2 g Topical QID   feeding supplement  237 mL Oral BID BM   fluticasone furoate-vilanterol  1 puff Inhalation Daily   insulin aspart  0-9 Units Subcutaneous Q4H   lamoTRIgine  25 mg Oral BID   nystatin  5 mL Oral QID   mouth rinse  15 mL Mouth Rinse 4 times per day   pantoprazole  40 mg Oral Daily   rosuvastatin  40 mg Oral Daily   sertraline  100 mg Oral QHS   umeclidinium bromide  1 puff Inhalation Daily   Continuous Infusions:  sodium chloride     0.9 % NaCl with KCl 20 mEq / L Stopped (02/09/23 0935)     LOS: 4 days   Time spent:  Azucena Fallen, DO [facility]  If 7PM-7AM, please contact night-coverage [email or web address]  02/10/2023, 7:49 AM

## 2023-02-10 NOTE — Progress Notes (Signed)
Occupational Therapy Treatment Patient Details Name: Michelle [name] MRN: [id number] DOB: 30-Jun-1933 Today's Date: 02/10/2023   History of present illness 89 y.o. female presents to [facility] hospital on 02/06/2023 after having a seizure, falling and striking her head. Pt intubated in the ED, extubated same date. PMH includes HTN, seizures, DMII, HLD, dementia, CAD, COPD.   OT comments  Patient demonstrating good gains this treatment session. Patient assisting with bed mobility but requiring mod assist to complete. Patient able to stand from EOB with mod assist to power up and to transfer to recliner from 2 person assist in previous session. Patient able to perform grooming and self feeding seated in wheelchair. Patient will benefit from intensive inpatient follow up therapy, >3 hours/day to further address bathing, dressing, grooming, and functional transfers.    Recommendations for follow up therapy are one component of a multi-disciplinary discharge planning process, led by the attending physician.  Recommendations may be updated based on patient status, additional functional criteria and insurance authorization.    Assistance Recommended at Discharge Intermittent Supervision/Assistance  Patient can return home with the following  A lot of help with walking and/or transfers;A lot of help with bathing/dressing/bathroom;Two people to help with walking and/or transfers   Equipment Recommendations  Wheelchair (measurements OT);Wheelchair cushion (measurements OT)    Recommendations for Other Services      Precautions / Restrictions Precautions Precautions: Fall Precaution Comments: seizure Restrictions Weight Bearing Restrictions: No       Mobility Bed Mobility Overal bed mobility: Needs Assistance Bed Mobility: Supine to Sit     Supine to sit: Mod assist, HOB elevated     General bed mobility comments: began takinig legs to EOB and required assistsance to complete, assistance with  scooting to EOB    Transfers Overall transfer level: Needs assistance Equipment used: Rolling walker (2 wheels) Transfers: Sit to/from Stand, Bed to chair/wheelchair/BSC Sit to Stand: Mod assist     Step pivot transfers: Mod assist     General transfer comment: mod assist ot stand from EOB and to manage RW and balance     Balance Overall balance assessment: Needs assistance Sitting-balance support: Feet supported Sitting balance-Leahy Scale: Fair Sitting balance - Comments: min guard for sitting balance   Standing balance support: Bilateral upper extremity supported Standing balance-Leahy Scale: Poor Standing balance comment: reliant on RW for standing balance                           ADL either performed or assessed with clinical judgement   ADL Overall ADL's : Needs assistance/impaired Eating/Feeding: Sitting;Set up Eating/Feeding Details (indicate cue type and reason): able to manage utensils following setup Grooming: Wash/dry hands;Wash/dry face;Oral care;Brushing hair;Minimal assistance;Sitting Grooming Details (indicate cue type and reason): in recliner     Lower Body Bathing: Maximal assistance;Sit to/from stand Lower Body Bathing Details (indicate cue type and reason): assisted with cleaning peri area while standing                            Extremity/Trunk Assessment              Vision       Perception     Praxis      Cognition Arousal/Alertness: Awake/alert   Overall Cognitive Status: Impaired/Different from baseline Area of Impairment: Orientation, Attention, Memory, Following commands, Safety/judgement, Awareness, Problem solving  Orientation Level: Disoriented to, Place, Time, Situation Current Attention Level: Sustained, Focused Memory: Decreased short-term memory Following Commands: Follows one step commands with increased time Safety/Judgement: Decreased awareness of safety, Decreased  awareness of deficits Awareness: Intellectual Problem Solving: Difficulty sequencing, Requires verbal cues, Requires tactile cues, Slow processing General Comments: oriented to self, asking to speak with her mother        Exercises      Shoulder Instructions       General Comments SpO2 96 on RA    Pertinent Vitals/ Pain       Pain Assessment Pain Assessment: Faces Faces Pain Scale: Hurts a little bit Pain Location: generalized with movement Pain Descriptors / Indicators: Grimacing Pain Intervention(s): Limited activity within patient's tolerance, Monitored during session, Repositioned  Home Living                                          Prior Functioning/Environment              Frequency  Min 1X/week        Progress Toward Goals  OT Goals(current goals can now be found in the care plan section)  Progress towards OT goals: Progressing toward goals  Acute Rehab OT Goals Patient Stated Goal: get better OT Goal Formulation: With patient Time For Goal Achievement: 02/23/23 Potential to Achieve Goals: Good ADL Goals Pt Will Perform Eating: with modified independence;sitting;with min assist Pt Will Perform Grooming: sitting;with min assist Pt Will Perform Upper Body Bathing: with min assist;sitting Pt Will Transfer to Toilet: with mod assist;stand pivot transfer;bedside commode Additional ADL Goal #1: pt will complete bed mobility min (A) as precursor to adls.  Plan Discharge plan remains appropriate    Co-evaluation                 AM-PAC OT "6 Clicks" Daily Activity     Outcome Measure   Help from another person eating meals?: Total Help from another person taking care of personal grooming?: Total Help from another person toileting, which includes using toliet, bedpan, or urinal?: A Lot Help from another person bathing (including washing, rinsing, drying)?: A Lot Help from another person to put on and taking off regular upper  body clothing?: A Lot Help from another person to put on and taking off regular lower body clothing?: A Lot 6 Click Score: 10    End of Session Equipment Utilized During Treatment: Gait belt;Rolling walker (2 wheels)  OT Visit Diagnosis: Unsteadiness on feet (R26.81);Repeated falls (R29.6);Muscle weakness (generalized) (M62.81)   Activity Tolerance Patient tolerated treatment well   Patient Left in chair;with call bell/phone within reach;with chair alarm set   Nurse Communication Mobility status;Precautions        Time: [id number] OT Time Calculation (min): 30 min  Charges: OT General Charges $OT Visit: 1 Visit OT Treatments $Self Care/Home Management : 8-22 mins $Therapeutic Activity: 8-22 mins  Alfonse Flavors, OTA Acute Rehabilitation Services  Office [phone]   Dewain Penning 02/10/2023, 12:01 PM

## 2023-02-10 NOTE — Plan of Care (Signed)
  Problem: Education: Goal: Ability to describe self-care measures that may prevent or decrease complications (Diabetes Survival Skills Education) will improve Outcome: Progressing Goal: Individualized Educational Video(s) Outcome: Progressing   Problem: Coping: Goal: Ability to adjust to condition or change in health will improve Outcome: Progressing   Problem: Fluid Volume: Goal: Ability to maintain a balanced intake and output will improve Outcome: Progressing   Problem: Health Behavior/Discharge Planning: Goal: Ability to identify and utilize available resources and services will improve Outcome: Progressing Goal: Ability to manage health-related needs will improve Outcome: Progressing   Problem: Metabolic: Goal: Ability to maintain appropriate glucose levels will improve Outcome: Progressing   Problem: Nutritional: Goal: Maintenance of adequate nutrition will improve Outcome: Progressing Goal: Progress toward achieving an optimal weight will improve Outcome: Progressing   Problem: Skin Integrity: Goal: Risk for impaired skin integrity will decrease Outcome: Progressing   Problem: Tissue Perfusion: Goal: Adequacy of tissue perfusion will improve Outcome: Progressing   Problem: Safety: Goal: Non-violent Restraint(s) Outcome: Progressing   Problem: Education: Goal: Knowledge of General Education information will improve Description: Including pain rating scale, medication(s)/side effects and non-pharmacologic comfort measures Outcome: Progressing   Problem: Health Behavior/Discharge Planning: Goal: Ability to manage health-related needs will improve Outcome: Progressing   Problem: Clinical Measurements: Goal: Ability to maintain clinical measurements within normal limits will improve Outcome: Progressing Goal: Will remain free from infection Outcome: Progressing Goal: Diagnostic test results will improve Outcome: Progressing Goal: Respiratory complications  will improve Outcome: Progressing Goal: Cardiovascular complication will be avoided Outcome: Progressing   Problem: Activity: Goal: Risk for activity intolerance will decrease Outcome: Progressing   Problem: Nutrition: Goal: Adequate nutrition will be maintained Outcome: Progressing   Problem: Coping: Goal: Level of anxiety will decrease Outcome: Progressing   Problem: Elimination: Goal: Will not experience complications related to bowel motility Outcome: Progressing Goal: Will not experience complications related to urinary retention Outcome: Progressing   Problem: Pain Managment: Goal: General experience of comfort will improve Outcome: Progressing   Problem: Safety: Goal: Ability to remain free from injury will improve Outcome: Progressing   Problem: Skin Integrity: Goal: Risk for impaired skin integrity will decrease Outcome: Progressing   

## 2023-02-10 NOTE — Progress Notes (Signed)
   02/10/23 1400  Spiritual Encounters  Type of Visit Initial  Care provided to: Pt and family  Referral source Patient request;Family  Reason for visit Advance directives  OnCall Visit No   Ch responded to request for AD. Pt's son was at bedside. Ch assisted pt with AD education. Pt will page Ch when form is ready. No follow-up needed at this time.

## 2023-02-10 NOTE — Progress Notes (Signed)
Speech Language Pathology Treatment: Dysphagia  Patient Details Name: Michelle [name] MRN: [id number] DOB: December 29, 1932 Today's Date: 02/10/2023 Time: 1150-1205 SLP Time Calculation (min) (ACUTE ONLY): 15 min  Assessment / Plan / Recommendation Clinical Impression  Pt seen for ongoing dysphagia management.  Pt has has a significant improvement in mental status.  Son reports she's back to about 85% now.  Pt tolerated all consistencies trialed today, including thin liquid by straw.  With mechanical soft solids (which is seemingly consistent with baseline diet) she exhibited prompt oral response and excellent bolus clearance.  Recommend advancing diet.  She may benefit from cognitive linguistic assessment at next level of care if she has not returned 100% to her baseline. SLP will follow for diet tolerance while she remains in house.  Recommend mechanical soft diet with thin liquids.     HPI HPI: Michelle [name] is an 89 yo female who presented as trauma after having a seizure while utilizing the bedside toilet and hit her head. Head CT and Chest CT 7/22 negative for acute findings.  Pt with hx HTN, DM2, Hyperlipidemia, Dementia, CAD, COPD.  SLP follow up for dysphagia management indicated - OT reports family states pt having some coughing with intake and oral holding causing concern for her airway protection with po intake.      SLP Plan  Continue with current plan of care      Recommendations for follow up therapy are one component of a multi-disciplinary discharge planning process, led by the attending physician.  Recommendations may be updated based on patient status, additional functional criteria and insurance authorization.    Recommendations  Diet recommendations: Dysphagia 3 (mechanical soft);Thin liquid Liquids provided via: Cup;Straw Medication Administration: Whole meds with liquid (halve or crush larger pills if needed) Supervision: Intermittent supervision to cue for compensatory  strategies Compensations: Slow rate;Small sips/bites Postural Changes and/or Swallow Maneuvers: Seated upright 90 degrees                  Oral care BID   Frequent or constant Supervision/Assistance Dysphagia, oral phase (R13.11)     Continue with current plan of care     Kerrie Pleasure, MA, CCC-SLP Acute Rehabilitation Services Office: [phone] 02/10/2023, 12:13 PM

## 2023-02-10 NOTE — Progress Notes (Signed)
Inpatient Rehab Admissions Coordinator:  Saw pt and family at bedside. Informed them that awaiting appeal decision. Will continue to follow.  Wolfgang Phoenix, MS, CCC-SLP Admissions Coordinator [phone]

## 2023-02-10 NOTE — TOC Progression Note (Signed)
Transition of Care [facility]) - Progression Note    Patient Details  Name: Michelle [name] MRN: [id number] Date of Birth: 09-09-1932  [facility]) CM/SW Contact  Kermit Balo, RN Phone Number: 02/10/2023, 12:01 PM  Clinical Narrative:     Pt was denied CIR but family is appealing. Awaiting appeal decision. TOC following.  Expected Discharge Plan: IP Rehab Facility    Expected Discharge Plan and Services                                               Social Determinants of Health (SDOH) Interventions SDOH Screenings   Food Insecurity: No Food Insecurity (02/07/2023)  Housing: Patient Declined (02/07/2023)  Transportation Needs: No Transportation Needs (02/07/2023)  Utilities: Not At Risk (02/07/2023)  Depression (PHQ2-9): Medium Risk (08/19/2022)  Financial Resource Strain: Low Risk  (06/20/2022)  Physical Activity: Inactive (06/20/2022)  Social Connections: Socially Isolated (06/20/2022)  Stress: No Stress Concern Present (06/20/2022)  Tobacco Use: Medium Risk (02/07/2023)    Readmission Risk Interventions    09/01/2020   12:30 PM  Readmission Risk Prevention Plan  Transportation Screening Complete  PCP or Specialist Appt within 3-5 Days Complete  HRI or Home Care Consult Complete  Social Work Consult for Recovery Care Planning/Counseling Complete  Palliative Care Screening Not Applicable  Medication Review Oceanographer) Complete

## 2023-02-11 DIAGNOSIS — R569 Unspecified convulsions: Secondary | ICD-10-CM | POA: Diagnosis not present

## 2023-02-11 LAB — GLUCOSE, CAPILLARY
Glucose-Capillary: 170 mg/dL — ABNORMAL HIGH (ref 70–99)
Glucose-Capillary: 149 mg/dL — ABNORMAL HIGH (ref 70–99)
Glucose-Capillary: 241 mg/dL — ABNORMAL HIGH (ref 70–99)
Glucose-Capillary: 136 mg/dL — ABNORMAL HIGH (ref 70–99)
Glucose-Capillary: 233 mg/dL — ABNORMAL HIGH (ref 70–99)

## 2023-02-11 MED ORDER — LORAZEPAM 2 MG/ML IJ SOLN
0.5000 mg | Freq: Once | INTRAMUSCULAR | Status: AC
  Administered 2023-02-11: 0.5 mg via INTRAVENOUS
  Filled 2023-02-11: qty 1

## 2023-02-11 NOTE — Plan of Care (Signed)
  Problem: Education: Goal: Ability to describe self-care measures that may prevent or decrease complications (Diabetes Survival Skills Education) will improve Outcome: Progressing Goal: Individualized Educational Video(s) Outcome: Progressing   Problem: Coping: Goal: Ability to adjust to condition or change in health will improve Outcome: Progressing   Problem: Fluid Volume: Goal: Ability to maintain a balanced intake and output will improve Outcome: Progressing   Problem: Health Behavior/Discharge Planning: Goal: Ability to identify and utilize available resources and services will improve Outcome: Progressing Goal: Ability to manage health-related needs will improve Outcome: Progressing   Problem: Metabolic: Goal: Ability to maintain appropriate glucose levels will improve Outcome: Progressing   Problem: Nutritional: Goal: Maintenance of adequate nutrition will improve Outcome: Progressing Goal: Progress toward achieving an optimal weight will improve Outcome: Progressing   Problem: Skin Integrity: Goal: Risk for impaired skin integrity will decrease Outcome: Progressing   Problem: Tissue Perfusion: Goal: Adequacy of tissue perfusion will improve Outcome: Progressing   Problem: Safety: Goal: Non-violent Restraint(s) Outcome: Progressing   Problem: Education: Goal: Knowledge of General Education information will improve Description: Including pain rating scale, medication(s)/side effects and non-pharmacologic comfort measures Outcome: Progressing   Problem: Health Behavior/Discharge Planning: Goal: Ability to manage health-related needs will improve Outcome: Progressing   Problem: Clinical Measurements: Goal: Ability to maintain clinical measurements within normal limits will improve Outcome: Progressing Goal: Will remain free from infection Outcome: Progressing Goal: Diagnostic test results will improve Outcome: Progressing Goal: Respiratory complications  will improve Outcome: Progressing Goal: Cardiovascular complication will be avoided Outcome: Progressing   Problem: Activity: Goal: Risk for activity intolerance will decrease Outcome: Progressing   Problem: Nutrition: Goal: Adequate nutrition will be maintained Outcome: Progressing   Problem: Coping: Goal: Level of anxiety will decrease Outcome: Progressing   Problem: Elimination: Goal: Will not experience complications related to bowel motility Outcome: Progressing Goal: Will not experience complications related to urinary retention Outcome: Progressing   Problem: Pain Managment: Goal: General experience of comfort will improve Outcome: Progressing   Problem: Safety: Goal: Ability to remain free from injury will improve Outcome: Progressing   Problem: Skin Integrity: Goal: Risk for impaired skin integrity will decrease Outcome: Progressing   

## 2023-02-11 NOTE — Progress Notes (Addendum)
PT called RN at around 1545 to work with patient, RN walked into patient room at 1615 and saw patient on floor on her bottom, patient uninjured, A+Ox4, but confused and irritable. Patient stated "she was just walking" Patient had previously been seated in reclining chair, chair alarm appeared to be on. Patient stated she pushed table away from chair. Patient requiring 3 staff members to assist to chair and then stedy to get back to bed. VSS and MD notified. Attempted to call son [name] to inform him, will try again at a later time.

## 2023-02-11 NOTE — Plan of Care (Signed)
  Problem: Education: Goal: Ability to describe self-care measures that may prevent or decrease complications (Diabetes Survival Skills Education) will improve Outcome: Progressing Goal: Individualized Educational Video(s) Outcome: Progressing   Problem: Coping: Goal: Ability to adjust to condition or change in health will improve Outcome: Progressing   Problem: Fluid Volume: Goal: Ability to maintain a balanced intake and output will improve Outcome: Progressing   Problem: Health Behavior/Discharge Planning: Goal: Ability to identify and utilize available resources and services will improve Outcome: Progressing Goal: Ability to manage health-related needs will improve Outcome: Progressing   Problem: Metabolic: Goal: Ability to maintain appropriate glucose levels will improve Outcome: Progressing   Problem: Nutritional: Goal: Maintenance of adequate nutrition will improve Outcome: Progressing Goal: Progress toward achieving an optimal weight will improve Outcome: Progressing   Problem: Skin Integrity: Goal: Risk for impaired skin integrity will decrease Outcome: Progressing   Problem: Tissue Perfusion: Goal: Adequacy of tissue perfusion will improve Outcome: Progressing   Problem: Safety: Goal: Non-violent Restraint(s) Outcome: Progressing   Problem: Education: Goal: Knowledge of General Education information will improve Description: Including pain rating scale, medication(s)/side effects and non-pharmacologic comfort measures Outcome: Progressing   Problem: Health Behavior/Discharge Planning: Goal: Ability to manage health-related needs will improve Outcome: Progressing   Problem: Clinical Measurements: Goal: Ability to maintain clinical measurements within normal limits will improve Outcome: Progressing Goal: Will remain free from infection Outcome: Progressing Goal: Diagnostic test results will improve Outcome: Progressing Goal: Respiratory complications  will improve Outcome: Progressing Goal: Cardiovascular complication will be avoided Outcome: Progressing   Problem: Nutrition: Goal: Adequate nutrition will be maintained Outcome: Progressing   Problem: Coping: Goal: Level of anxiety will decrease Outcome: Progressing   Problem: Elimination: Goal: Will not experience complications related to bowel motility Outcome: Progressing Goal: Will not experience complications related to urinary retention Outcome: Progressing   Problem: Pain Managment: Goal: General experience of comfort will improve Outcome: Progressing   Problem: Safety: Goal: Ability to remain free from injury will improve Outcome: Progressing   Problem: Skin Integrity: Goal: Risk for impaired skin integrity will decrease Outcome: Progressing

## 2023-02-11 NOTE — Progress Notes (Signed)
Physical Therapy Treatment Patient Details Name: Michelle [name] MRN: [id number] DOB: 1932/11/03 Today's Date: 02/11/2023   History of Present Illness 89 y.o. female presents to [facility] hospital on 02/06/2023 after having a seizure, falling and striking her head. Pt intubated in the ED, extubated same date. PMH includes HTN, seizures, DMII, HLD, dementia, CAD, COPD.    PT Comments  Pt greeted up in chair on arrival and agreeable to session with encouragement. Pt able to come to standing with mod A initially down to min A with practice, pt coming to stand x4 throughout session. Pt needing min A to sit safety as pt with poor eccentric control and sitting quickly. Pt able to progress gait for short distance in room with slow step to pattern as pt with c/o bil knee pain (chronic). Pt needing max cues for safety and hand placement during gait as pt fatigues pt resting forearms on RW, max education on decreased safety and stability in this position. Pt with fair tolerance for seated therex with cues for technique. Pt continues to benefit from skilled PT services to progress toward functional mobility goals.      Assistance Recommended at Discharge Frequent or constant Supervision/Assistance  If plan is discharge home, recommend the following:  Can travel by private vehicle    Two people to help with walking and/or transfers;Assistance with cooking/housework;Two people to help with bathing/dressing/bathroom;Direct supervision/assist for medications management;Direct supervision/assist for financial management;Assist for transportation;Help with stairs or ramp for entrance      Equipment Recommendations  Other (comment) (defer)    Recommendations for Other Services       Precautions / Restrictions Precautions Precautions: Fall Precaution Comments: seizure Restrictions Weight Bearing Restrictions: No     Mobility  Bed Mobility Overal bed mobility: Needs Assistance             General bed  mobility comments: pt up in chair on arrival    Transfers Overall transfer level: Needs assistance Equipment used: Rolling walker (2 wheels) Transfers: Sit to/from Stand, Bed to chair/wheelchair/BSC Sit to Stand: Mod assist, Min assist           General transfer comment: mod fading to min A to power up to stand    Ambulation/Gait Ambulation/Gait assistance: Min assist (chair follow) Gait Distance (Feet): 5 Feet Assistive device: Rolling walker (2 wheels) Gait Pattern/deviations: Step-to pattern, Decreased stride length, Trunk flexed Gait velocity: decr     General Gait Details: slow gait with max cues for trunk eleavation and safe hand placement on RW as pt with tendency for resting forarms on RW (son states this as BL) chair follow fo rsafety   Stairs             Wheelchair Mobility     Tilt Bed    Modified Rankin (Stroke Patients Only)       Balance Overall balance assessment: Needs assistance Sitting-balance support: Feet supported Sitting balance-Leahy Scale: Fair Sitting balance - Comments: min guard for sitting balance   Standing balance support: Bilateral upper extremity supported Standing balance-Leahy Scale: Poor Standing balance comment: reliant on RW for standing balance                            Cognition Arousal/Alertness: Awake/alert Behavior During Therapy: WFL for tasks assessed/performed Overall Cognitive Status: Impaired/Different from baseline Area of Impairment: Orientation, Attention, Memory, Following commands, Safety/judgement, Awareness, Problem solving  Orientation Level: Disoriented to, Place Current Attention Level: Sustained, Focused Memory: Decreased short-term memory Following Commands: Follows one step commands with increased time Safety/Judgement: Decreased awareness of safety, Decreased awareness of deficits Awareness: Intellectual Problem Solving: Difficulty sequencing, Requires  verbal cues, Requires tactile cues, Slow processing General Comments: increased encouragement needed to progress        Exercises General Exercises - Lower Extremity Long Arc Quad: AROM, Right, Left, 10 reps, Seated Hip Flexion/Marching: AROM, Right, Left, 10 reps, Seated    General Comments General comments (skin integrity, edema, etc.): VSS on RA      Pertinent Vitals/Pain Pain Assessment Pain Assessment: Faces Faces Pain Scale: Hurts even more Pain Location: bil knees Pain Descriptors / Indicators: Grimacing Pain Intervention(s): Monitored during session, Limited activity within patient's tolerance    Home Living                          Prior Function            PT Goals (current goals can now be found in the care plan section) Acute Rehab PT Goals Patient Stated Goal: did not state PT Goal Formulation: With patient Time For Goal Achievement: 02/20/23 Progress towards PT goals: Progressing toward goals    Frequency    Min 1X/week      PT Plan Current plan remains appropriate    Co-evaluation              AM-PAC PT "6 Clicks" Mobility   Outcome Measure  Help needed turning from your back to your side while in a flat bed without using bedrails?: A Lot Help needed moving from lying on your back to sitting on the side of a flat bed without using bedrails?: A Lot Help needed moving to and from a bed to a chair (including a wheelchair)?: A Lot Help needed standing up from a chair using your arms (e.g., wheelchair or bedside chair)?: A Little Help needed to walk in hospital room?: A Lot Help needed climbing 3-5 steps with a railing? : Total 6 Click Score: 12    End of Session Equipment Utilized During Treatment: Gait belt Activity Tolerance: Patient tolerated treatment well Patient left: in chair;with call bell/phone within reach;with chair alarm set;with family/visitor present Nurse Communication: Mobility status PT Visit Diagnosis: Other  abnormalities of gait and mobility (R26.89);Muscle weakness (generalized) (M62.81);Other symptoms and signs involving the nervous system (R29.898)     Time: 8295-6213 PT Time Calculation (min) (ACUTE ONLY): 26 min  Charges:    $Gait Training: 8-22 mins $Therapeutic Activity: 8-22 mins PT General Charges $$ ACUTE PT VISIT: 1 Visit                     Jannice Beitzel R. PTA Acute Rehabilitation Services Office: [phone]   Catalina Antigua 02/11/2023, 4:02 PM

## 2023-02-11 NOTE — Progress Notes (Signed)
PROGRESS NOTE    Michelle [name]  EXB:[id number] DOB: 02/20/1933 DOA: 02/06/2023 PCP: Shelva Majestic, MD   Brief Narrative:  Patient is an 89 year old female with past medical history of hypertension, diabetes type 2, hyperlipidemia, dementia, coronary disease, COPD with 55-pack-year smoking history presenting as a trauma after seizure while falling from a seated position on the toilet.  Seizure appears of lasted approximately 2 minutes, patient markedly confused postevent combative with EMS requiring Versed.  Patient ultimately intubated due to combative nature and concern for trauma.  PCCM and trauma teams consulted initially for admission.  Patient's acute metabolic encephalopathy continues to improve slowly, no further episodes of seizures at this time but no clear etiology for seizure provocation either.  Patient ultimately extubated 7/22, resumed on home Lamictal 25 twice daily.  Patient transition to general medical floor with improvement daily but quite slow.  Continues to be agitated difficult to orient, likely some underlying hospital delirium as well.  Occasionally requiring restraints and mittens to protect lines and telemetry.  Patient more awake alert today with family at bedside attempting to feed herself breakfast. Overnight 7/25 patient noted to transition to A-fib with transient RVR now back to sinus rhythm this morning. Morning of the 26th patient is markedly improved alert oriented to person place situation and time, only orienting question incorrect was year which son states would not be abnormal for her.  At this time patient remains medically stable for discharge, plan to transfer patient to inpatient rehab, insurance currently denied patient's request, rehab is appealing their decision based on patient's improved mental status and strength I feel it is certainly appropriate and reasonable for patient to transfer to inpatient rehab for ongoing medical care.  Assessment &  Plan:   Principal Problem:   Seizure (HCC) Active Problems:   Altered mental status   Scalp laceration  Acute metabolic encephalopathy, secondary to presumed breakthrough seizure, resolving Complicated by underlying dementia, trauma and head laceration Complicated by sundowning/hospital delirium -Unclear etiology of breakthrough seizure, continue patient on home Lamictal per neurology -Trauma initially following, appears stable, continue wound care over scalp laceration -Patient remains seizure-free per EEG -Hold any further CNS depressing meds, follow clinically (continue to wean off oxycodone as possible in the setting of trauma) -Zyprexa as needed for agitation overnight, delirium precautions ongoing -Mental status markedly improved overnight, ANO x 3, feeding herself in bedside chair speaking coherently with family at bedside  Acute hypoxic resp failure requiring mechanical ventilation Rule out aspiration pneumonitis COPD, questionably in exacerbation:  Continue on advair and spiriva patient's home medications 55-pack-year smoking history, quit smoking 2008 Lung nodule measure up to 2 cm Currently on room air at rest - ambulatory oxygen screen pending Acute event 7/24 morning likely aspiration of breakfast with transient respiratory distress - now on room air -breo and incruse ellipta; prn duoneb for wheezing -will need repeat CT in 3 months for lung nodule -defer to primary pulmonologist for his PCP   Acute transient episode of A-fib with RVR, likely provoked, resolved -Complicated by above, new stroke, hypoxia, agitation confusion sundowning place patient at high risk for event -Sinus rhythm on EKG overnight and again this morning, Cardizem x 1 given -hold anticoagulation given transient event -May benefit from Holter monitor at discharge  Acute metabolic acidosis Lactic acidosis, resolved -Resolved, continue to follow labs, bicarb trending up appropriately    Hypotension History of hypertension -Without shock or need for pressors -Hold sedating medications, hold amlodipine, carvedilol, enalapril, hydralazine, furosemide per home  med rec -Blood pressure remains labile in the setting of agitation and sedation, follow closely, continues on telemetry  Past medical history, stable CAD Hyperlipidemia -hold asa and plavix -Hold antihypertensives as above due to hypotension   Multi-septated cystic ovarian neoplasm -will need to f/u outpt w/ GYN (will likely need hysterectomy)   T2DM, well-controlled -Well-controlled A1c 5.5 continue sliding scale insulin hypoglycemic protocol   Depression -Continue home sertraline, hold nortriptyline  DVT prophylaxis: SCDs Start: 02/06/23 0402 Code Status:   Code Status: Full Code Family Communication: Son at bedside  Status is: Inpatient  Dispo: The patient is from: Home              Anticipated d/c is to: CIR              Anticipated d/c date is: Imminent              Patient currently IS medically stable for discharge  Consultants:  PCCM, neurology, trauma  Procedures:  None  Antimicrobials:  None indicated  Subjective: Overnight patient is vital status improved drastically, awake alert oriented today feeding herself at bedside, requesting discharge to rehab which is certainly reasonable.  Otherwise review of systems is unremarkable  Objective: Vitals:   02/10/23 1555 02/10/23 1949 02/11/23 0008 02/11/23 0500  BP: (!) 101/51 110/60 (!) 112/53   Pulse: 90 98    Resp: 18 18 20    Temp: 98.2 F (36.8 C) 98.6 F (37 C) 98.4 F (36.9 C)   TempSrc: Oral Oral    SpO2: 100% 96% 96%   Weight:    82.4 kg  Height:        Intake/Output Summary (Last 24 hours) at 02/11/2023 0805 Last data filed at 02/11/2023 4098 Gross per 24 hour  Intake --  Output 500 ml  Net -500 ml   Filed Weights   02/09/23 0500 02/10/23 0500 02/11/23 0500  Weight: 78.4 kg 82.6 kg 82.4 kg     Examination:  General:  Pleasantly sitting up in bedside chair eating breakfast on her own alert oriented x 4 HEENT: Head laceration, superficial over the right parietal area Neck:  Without mass or deformity.  Trachea is midline. Lungs:  Clear to auscultate bilaterally without rhonchi, wheeze, or rales. Heart:  Regular rate and rhythm.  Without murmurs, rubs, or gallops. Abdomen:  Soft, nontender, nondistended.  Without guarding or rebound. Extremities: Without cyanosis, clubbing, edema, or obvious deformity. Skin:  Warm and dry, no erythema.  Data Reviewed: I have personally reviewed following labs and imaging studies  CBC: Recent Labs  Lab 02/06/23 1405 02/07/23 0708 02/08/23 0043 02/09/23 0259 02/10/23 0604  WBC 12.6* 7.3 9.9 11.7* 8.8  NEUTROABS  --  4.9  --   --   --   HGB 10.4* 9.5* 9.8* 12.0 9.8*  HCT 31.0* 28.8* 29.2* 36.1 30.9*  MCV 89.3 90.0 85.9 88.3 90.1  PLT 215 226 242 238 241   Basic Metabolic Panel: Recent Labs  Lab 02/06/23 1405 02/06/23 1748 02/07/23 0708 02/07/23 1746 02/08/23 0043 02/09/23 0259 02/10/23 0604  NA 135  --  137  --  140 138 140  K 4.1  --  3.6  --  3.5 3.6 3.8  CL 109  --  110  --  108 109 107  CO2 15*  --  19*  --  21* 19* 23  GLUCOSE 114*  --  100*  --  132* 126* 146*  BUN 21  --  15  --  14  18 17  CREATININE 0.86  --  0.81  --  0.83 0.94 0.83  CALCIUM 8.5*  --  8.3*  --  8.9 8.8* 8.6*  MG 1.1* 2.3 2.2 2.2  --   --   --   PHOS 1.8* 1.6* 3.0 2.1*  --   --   --    GFR: Estimated Creatinine Clearance: 49.7 mL/min (by C-G formula based on SCr of 0.83 mg/dL). Liver Function Tests: Recent Labs  Lab 02/06/23 0141 02/07/23 0708  AST 21 25  ALT 16 16  ALKPHOS 36* 27*  BILITOT 0.5 0.4  PROT 6.7 5.9*  ALBUMIN 3.3* 3.1*    Recent Labs  Lab 02/06/23 0314  AMMONIA <10   Coagulation Profile: Recent Labs  Lab 02/06/23 0141  INR 1.2   CBG: Recent Labs  Lab 02/10/23 1237 02/10/23 1408 02/10/23 1627 02/10/23 2122  02/11/23 0716  GLUCAP 205* 163* 161* 193* 170*   Lipid Profile: Recent Labs    02/10/23 0604  TRIG 197*   Sepsis Labs: Recent Labs  Lab 02/06/23 0156 02/06/23 1405  LATICACIDVEN 3.0* 1.8    Recent Results (from the past 240 hour(s))  Blood culture (routine x 2)     Status: None (Preliminary result)   Collection Time: 02/06/23  2:35 AM   Specimen: BLOOD RIGHT HAND  Result Value Ref Range Status   Specimen Description BLOOD RIGHT HAND  Final   Special Requests   Final    BOTTLES DRAWN AEROBIC AND ANAEROBIC Blood Culture adequate volume   Culture   Final    NO GROWTH 4 DAYS Performed at [facility], [address] [place], [place] [address]    Report Status PENDING  Incomplete  Blood culture (routine x 2)     Status: None (Preliminary result)   Collection Time: 02/06/23  2:42 AM   Specimen: BLOOD LEFT HAND  Result Value Ref Range Status   Specimen Description BLOOD LEFT HAND  Final   Special Requests   Final    BOTTLES DRAWN AEROBIC AND ANAEROBIC Blood Culture results may not be optimal due to an inadequate volume of blood received in culture bottles   Culture   Final    NO GROWTH 4 DAYS Performed at [facility], [address], [place], [place] [address]    Report Status PENDING  Incomplete  MRSA Next Gen by PCR, Nasal     Status: None   Collection Time: 02/06/23  5:21 AM   Specimen: Nasal Mucosa; Nasal Swab  Result Value Ref Range Status   MRSA by PCR Next Gen NOT DETECTED NOT DETECTED Final    Comment: (NOTE) The GeneXpert MRSA Assay (FDA approved for NASAL specimens only), is one component of a comprehensive MRSA colonization surveillance program. It is not intended to diagnose MRSA infection nor to guide or monitor treatment for MRSA infections. Test performance is not FDA approved in patients less than 43 years old. Performed at [facility], [address], [place], [place] [address]   Urine Culture (for pregnant, neutropenic or  urologic patients or patients with an indwelling urinary catheter)     Status: None   Collection Time: 02/07/23  8:03 AM   Specimen: Urine, Clean Catch  Result Value Ref Range Status   Specimen Description URINE, CLEAN CATCH  Final   Special Requests NONE  Final   Culture   Final    NO GROWTH Performed at [facility], [address] [place], [place] [address]    Report Status 02/08/2023 FINAL  Final         Radiology Studies: No results found.      Scheduled Meds:  diclofenac Sodium  2 g Topical QID   feeding supplement  237 mL Oral BID BM   fluticasone furoate-vilanterol  1 puff Inhalation Daily   insulin aspart  0-20 Units Subcutaneous TID WC   insulin aspart  0-5 Units Subcutaneous QHS   lamoTRIgine  25 mg Oral BID   nystatin  5 mL Oral QID   mouth rinse  15 mL Mouth Rinse 4 times per day   pantoprazole  40 mg Oral Daily   rosuvastatin  40 mg Oral Daily   sertraline  100 mg Oral QHS   umeclidinium bromide  1 puff Inhalation Daily   Continuous Infusions:  sodium chloride     0.9 % NaCl with KCl 20 mEq / L Stopped (02/09/23 0935)     LOS: 5 days   Time spent:  Azucena Fallen, DO [facility]  If 7PM-7AM, please contact night-coverage [email or web address]  02/11/2023, 8:05 AM

## 2023-02-12 DIAGNOSIS — R569 Unspecified convulsions: Secondary | ICD-10-CM | POA: Diagnosis not present

## 2023-02-12 LAB — GLUCOSE, CAPILLARY
Glucose-Capillary: 119 mg/dL — ABNORMAL HIGH (ref 70–99)
Glucose-Capillary: 143 mg/dL — ABNORMAL HIGH (ref 70–99)
Glucose-Capillary: 208 mg/dL — ABNORMAL HIGH (ref 70–99)
Glucose-Capillary: 159 mg/dL — ABNORMAL HIGH (ref 70–99)
Glucose-Capillary: 158 mg/dL — ABNORMAL HIGH (ref 70–99)

## 2023-02-12 NOTE — Plan of Care (Signed)
  Problem: Education: Goal: Ability to describe self-care measures that may prevent or decrease complications (Diabetes Survival Skills Education) will improve Outcome: Progressing Goal: Individualized Educational Video(s) Outcome: Progressing   Problem: Coping: Goal: Ability to adjust to condition or change in health will improve Outcome: Progressing   Problem: Fluid Volume: Goal: Ability to maintain a balanced intake and output will improve Outcome: Progressing   Problem: Health Behavior/Discharge Planning: Goal: Ability to identify and utilize available resources and services will improve Outcome: Progressing Goal: Ability to manage health-related needs will improve Outcome: Progressing   Problem: Metabolic: Goal: Ability to maintain appropriate glucose levels will improve Outcome: Progressing   Problem: Nutritional: Goal: Maintenance of adequate nutrition will improve Outcome: Progressing Goal: Progress toward achieving an optimal weight will improve Outcome: Progressing   Problem: Skin Integrity: Goal: Risk for impaired skin integrity will decrease Outcome: Progressing   Problem: Tissue Perfusion: Goal: Adequacy of tissue perfusion will improve Outcome: Progressing   Problem: Safety: Goal: Non-violent Restraint(s) Outcome: Progressing   Problem: Education: Goal: Knowledge of General Education information will improve Description: Including pain rating scale, medication(s)/side effects and non-pharmacologic comfort measures Outcome: Progressing   Problem: Health Behavior/Discharge Planning: Goal: Ability to manage health-related needs will improve Outcome: Progressing   Problem: Clinical Measurements: Goal: Ability to maintain clinical measurements within normal limits will improve Outcome: Progressing Goal: Will remain free from infection Outcome: Progressing Goal: Diagnostic test results will improve Outcome: Progressing Goal: Respiratory complications  will improve Outcome: Progressing Goal: Cardiovascular complication will be avoided Outcome: Progressing   Problem: Activity: Goal: Risk for activity intolerance will decrease Outcome: Progressing   Problem: Nutrition: Goal: Adequate nutrition will be maintained Outcome: Progressing   Problem: Coping: Goal: Level of anxiety will decrease Outcome: Progressing   Problem: Elimination: Goal: Will not experience complications related to bowel motility Outcome: Progressing Goal: Will not experience complications related to urinary retention Outcome: Progressing   Problem: Pain Managment: Goal: General experience of comfort will improve Outcome: Progressing   Problem: Safety: Goal: Ability to remain free from injury will improve Outcome: Progressing   Problem: Skin Integrity: Goal: Risk for impaired skin integrity will decrease Outcome: Progressing   

## 2023-02-12 NOTE — Plan of Care (Signed)
  Problem: Education: Goal: Ability to describe self-care measures that may prevent or decrease complications (Diabetes Survival Skills Education) will improve Outcome: Progressing Goal: Individualized Educational Video(s) Outcome: Progressing   Problem: Coping: Goal: Ability to adjust to condition or change in health will improve Outcome: Progressing   Problem: Fluid Volume: Goal: Ability to maintain a balanced intake and output will improve Outcome: Progressing   Problem: Health Behavior/Discharge Planning: Goal: Ability to identify and utilize available resources and services will improve Outcome: Progressing Goal: Ability to manage health-related needs will improve Outcome: Progressing   Problem: Metabolic: Goal: Ability to maintain appropriate glucose levels will improve Outcome: Progressing   Problem: Nutritional: Goal: Maintenance of adequate nutrition will improve Outcome: Progressing Goal: Progress toward achieving an optimal weight will improve Outcome: Progressing   Problem: Skin Integrity: Goal: Risk for impaired skin integrity will decrease Outcome: Progressing   Problem: Tissue Perfusion: Goal: Adequacy of tissue perfusion will improve Outcome: Progressing   Problem: Safety: Goal: Non-violent Restraint(s) Outcome: Progressing   Problem: Education: Goal: Knowledge of General Education information will improve Description: Including pain rating scale, medication(s)/side effects and non-pharmacologic comfort measures Outcome: Progressing   Problem: Health Behavior/Discharge Planning: Goal: Ability to manage health-related needs will improve Outcome: Progressing   Problem: Clinical Measurements: Goal: Ability to maintain clinical measurements within normal limits will improve Outcome: Progressing Goal: Will remain free from infection Outcome: Progressing Goal: Diagnostic test results will improve Outcome: Progressing Goal: Respiratory complications  will improve Outcome: Progressing Goal: Cardiovascular complication will be avoided Outcome: Progressing   Problem: Activity: Goal: Risk for activity intolerance will decrease Outcome: Progressing   Problem: Nutrition: Goal: Adequate nutrition will be maintained Outcome: Progressing   Problem: Elimination: Goal: Will not experience complications related to bowel motility Outcome: Progressing Goal: Will not experience complications related to urinary retention Outcome: Progressing   Problem: Pain Managment: Goal: General experience of comfort will improve Outcome: Progressing   Problem: Safety: Goal: Ability to remain free from injury will improve Outcome: Progressing   Problem: Skin Integrity: Goal: Risk for impaired skin integrity will decrease Outcome: Progressing

## 2023-02-12 NOTE — Progress Notes (Signed)
PROGRESS NOTE    Michelle [name]  WUJ:[id number] DOB: 1932-10-22 DOA: 02/06/2023 PCP: Shelva Majestic, MD   Brief Narrative:  Patient is an 89 year old female with past medical history of hypertension, diabetes type 2, hyperlipidemia, dementia, coronary disease, COPD with 55-pack-year smoking history presenting as a trauma after seizure while falling from a seated position on the toilet.  Seizure appears of lasted approximately 2 minutes, patient markedly confused postevent combative with EMS requiring Versed.  Patient ultimately intubated due to combative nature and concern for trauma.  PCCM and trauma teams consulted initially for admission.  Patient's acute metabolic encephalopathy continues to improve slowly, no further episodes of seizures at this time but no clear etiology for seizure provocation either.  Patient ultimately extubated 7/22, resumed on home Lamictal 25 twice daily.  Patient transition to general medical floor with improvement daily but quite slow.  Continues to be agitated difficult to orient, likely some underlying hospital delirium as well.  Occasionally requiring restraints and mittens to protect lines and telemetry.  Patient more awake alert today with family at bedside attempting to feed herself breakfast. Overnight 7/25 patient noted to transition to A-fib with transient RVR now back to sinus rhythm this morning. Morning of the 26th patient is markedly improved alert oriented to person place situation and time, only orienting question incorrect was year which son states would not be abnormal for her.  At this time patient remains medically stable for discharge, plan to transfer patient to inpatient rehab, insurance currently denied patient's request, rehab is appealing their decision based on patient's improved mental status and strength I feel it is certainly appropriate and reasonable for patient to transfer to inpatient rehab for ongoing medical care.  Assessment &  Plan:   Principal Problem:   Seizure (HCC) Active Problems:   Altered mental status   Scalp laceration  Acute metabolic encephalopathy, secondary to presumed breakthrough seizure, resolving Complicated by underlying dementia, trauma and head laceration Complicated by sundowning/hospital delirium -Unclear etiology of breakthrough seizure, continue patient on home Lamictal per neurology -Trauma initially following, appears stable, continue wound care over scalp laceration -Patient remains seizure-free per EEG -Hold any further CNS depressing meds, follow clinically (continue to wean off oxycodone as possible in the setting of trauma) -Zyprexa as needed for agitation overnight, delirium precautions ongoing -Mental status markedly improved overnight, ANO x 3, feeding herself in bedside chair speaking coherently with family at bedside -Ambulatory dysfunction ongoing - fell from seated position 7/27 without incident/injury  Acute hypoxic resp failure requiring mechanical ventilation Rule out aspiration pneumonitis COPD, questionably in exacerbation:  Continue on advair and spiriva patient's home medications 55-pack-year smoking history, quit smoking 2008 Lung nodule measure up to 2 cm Currently on room air at rest - ambulatory oxygen screen pending Acute event 7/24 morning likely aspiration of breakfast with transient respiratory distress - now on room air -breo and incruse ellipta; prn duoneb for wheezing -will need repeat CT in 3 months for lung nodule -defer to primary pulmonologist for his PCP   Acute transient episode of A-fib with RVR, likely provoked, resolved -Complicated by above, new stroke, hypoxia, agitation confusion sundowning place patient at high risk for event -Sinus rhythm on EKG overnight and again this morning, Cardizem x 1 given -hold anticoagulation given transient event -May benefit from Holter monitor at discharge  Acute metabolic acidosis Lactic acidosis,  resolved -Resolved, continue to follow labs, bicarb trending up appropriately   Hypotension History of hypertension -Without shock or need for pressors -  Hold sedating medications, hold amlodipine, carvedilol, enalapril, hydralazine, furosemide per home med rec -Blood pressure remains labile in the setting of agitation and sedation, follow closely, continues on telemetry  Past medical history, stable CAD Hyperlipidemia -hold asa and plavix -Hold antihypertensives as above due to hypotension   Multi-septated cystic ovarian neoplasm -will need to f/u outpt w/ GYN (will likely need hysterectomy)   T2DM, well-controlled -Well-controlled A1c 5.5 continue sliding scale insulin hypoglycemic protocol   Depression -Continue home sertraline, hold nortriptyline  DVT prophylaxis: SCDs Start: 02/06/23 0402 Code Status:   Code Status: Full Code Family Communication: Son at bedside  Status is: Inpatient  Dispo: The patient is from: Home              Anticipated d/c is to: CIR              Anticipated d/c date is: Imminent              Patient currently IS medically stable for discharge  Consultants:  PCCM, neurology, trauma  Procedures:  None  Antimicrobials:  None indicated  Subjective: Patient slid out of chair yesterday afternoon without injury - no events noted overnight.  Denies vomiting but admits to mild nausea, denies headache fever chills shortness of breath or chest pain  Objective: Vitals:   02/11/23 2031 02/11/23 2341 02/12/23 0343 02/12/23 0745  BP: 139/73 133/63 137/71 124/71  Pulse: (!) 110 95 93 (!) 103  Resp: 18 17 16 18   Temp: 98.1 F (36.7 C) 98.6 F (37 C) 98.8 F (37.1 C) 98.2 F (36.8 C)  TempSrc: Oral Oral Oral Oral  SpO2: 100% 98% 100% 99%  Weight:      Height:       No intake or output data in the 24 hours ending 02/12/23 0811  Filed Weights   02/09/23 0500 02/10/23 0500 02/11/23 0500  Weight: 78.4 kg 82.6 kg 82.4 kg     Examination:  General:  Pleasantly sitting up in bedside chair eating breakfast on her own alert oriented x 3 HEENT: Head laceration, superficial over the right parietal area Neck:  Without mass or deformity.  Trachea is midline. Lungs:  Clear to auscultate bilaterally without rhonchi, wheeze, or rales. Heart:  Regular rate and rhythm.  Without murmurs, rubs, or gallops. Abdomen:  Soft, nontender, nondistended.  Without guarding or rebound. Extremities: Without cyanosis, clubbing, edema, or obvious deformity. Skin:  Warm and dry, no erythema.  Data Reviewed: I have personally reviewed following labs and imaging studies  CBC: Recent Labs  Lab 02/06/23 1405 02/07/23 0708 02/08/23 0043 02/09/23 0259 02/10/23 0604  WBC 12.6* 7.3 9.9 11.7* 8.8  NEUTROABS  --  4.9  --   --   --   HGB 10.4* 9.5* 9.8* 12.0 9.8*  HCT 31.0* 28.8* 29.2* 36.1 30.9*  MCV 89.3 90.0 85.9 88.3 90.1  PLT 215 226 242 238 241   Basic Metabolic Panel: Recent Labs  Lab 02/06/23 1405 02/06/23 1748 02/07/23 0708 02/07/23 1746 02/08/23 0043 02/09/23 0259 02/10/23 0604  NA 135  --  137  --  140 138 140  K 4.1  --  3.6  --  3.5 3.6 3.8  CL 109  --  110  --  108 109 107  CO2 15*  --  19*  --  21* 19* 23  GLUCOSE 114*  --  100*  --  132* 126* 146*  BUN 21  --  15  --  14 18 17   CREATININE  0.86  --  0.81  --  0.83 0.94 0.83  CALCIUM 8.5*  --  8.3*  --  8.9 8.8* 8.6*  MG 1.1* 2.3 2.2 2.2  --   --   --   PHOS 1.8* 1.6* 3.0 2.1*  --   --   --    GFR: Estimated Creatinine Clearance: 49.7 mL/min (by C-G formula based on SCr of 0.83 mg/dL).  Liver Function Tests: Recent Labs  Lab 02/06/23 0141 02/07/23 0708  AST 21 25  ALT 16 16  ALKPHOS 36* 27*  BILITOT 0.5 0.4  PROT 6.7 5.9*  ALBUMIN 3.3* 3.1*    Recent Labs  Lab 02/06/23 0314  AMMONIA <10   Coagulation Profile: Recent Labs  Lab 02/06/23 0141  INR 1.2   CBG: Recent Labs  Lab 02/11/23 1319 02/11/23 1722 02/11/23 2101  02/12/23 0611 02/12/23 0806  GLUCAP 241* 136* 233* 119* 143*   Lipid Profile: Recent Labs    02/10/23 0604  TRIG 197*   Sepsis Labs: Recent Labs  Lab 02/06/23 0156 02/06/23 1405  LATICACIDVEN 3.0* 1.8    Recent Results (from the past 240 hour(s))  Blood culture (routine x 2)     Status: None   Collection Time: 02/06/23  2:35 AM   Specimen: BLOOD RIGHT HAND  Result Value Ref Range Status   Specimen Description BLOOD RIGHT HAND  Final   Special Requests   Final    BOTTLES DRAWN AEROBIC AND ANAEROBIC Blood Culture adequate volume   Culture   Final    NO GROWTH 5 DAYS Performed at [facility], [address], [place], [place] [address]    Report Status 02/11/2023 FINAL  Final  Blood culture (routine x 2)     Status: None   Collection Time: 02/06/23  2:42 AM   Specimen: BLOOD LEFT HAND  Result Value Ref Range Status   Specimen Description BLOOD LEFT HAND  Final   Special Requests   Final    BOTTLES DRAWN AEROBIC AND ANAEROBIC Blood Culture results may not be optimal due to an inadequate volume of blood received in culture bottles   Culture   Final    NO GROWTH 5 DAYS Performed at [facility], [address] [place], [place] [address]    Report Status 02/11/2023 FINAL  Final  MRSA Next Gen by PCR, Nasal     Status: None   Collection Time: 02/06/23  5:21 AM   Specimen: Nasal Mucosa; Nasal Swab  Result Value Ref Range Status   MRSA by PCR Next Gen NOT DETECTED NOT DETECTED Final    Comment: (NOTE) The GeneXpert MRSA Assay (FDA approved for NASAL specimens only), is one component of a comprehensive MRSA colonization surveillance program. It is not intended to diagnose MRSA infection nor to guide or monitor treatment for MRSA infections. Test performance is not FDA approved in patients less than 76 years old. Performed at [facility], [address] [place], [place] [address]   Urine Culture (for pregnant, neutropenic or urologic patients or  patients with an indwelling urinary catheter)     Status: None   Collection Time: 02/07/23  8:03 AM   Specimen: Urine, Clean Catch  Result Value Ref Range Status   Specimen Description URINE, CLEAN CATCH  Final   Special Requests NONE  Final   Culture   Final    NO GROWTH Performed at [facility], [address], [place], [place] [address]  Report Status 02/08/2023 FINAL  Final         Radiology Studies: No results found.      Scheduled Meds:  diclofenac Sodium  2 g Topical QID   feeding supplement  237 mL Oral BID BM   fluticasone furoate-vilanterol  1 puff Inhalation Daily   insulin aspart  0-20 Units Subcutaneous TID WC   insulin aspart  0-5 Units Subcutaneous QHS   lamoTRIgine  25 mg Oral BID   nystatin  5 mL Oral QID   mouth rinse  15 mL Mouth Rinse 4 times per day   pantoprazole  40 mg Oral Daily   rosuvastatin  40 mg Oral Daily   sertraline  100 mg Oral QHS   umeclidinium bromide  1 puff Inhalation Daily   Continuous Infusions:  sodium chloride     0.9 % NaCl with KCl 20 mEq / L Stopped (02/09/23 0935)     LOS: 6 days   Time spent:  Azucena Fallen, DO [facility]  If 7PM-7AM, please contact night-coverage [email or web address]  02/12/2023, 8:11 AM

## 2023-02-13 DIAGNOSIS — R569 Unspecified convulsions: Secondary | ICD-10-CM | POA: Diagnosis not present

## 2023-02-13 LAB — GLUCOSE, CAPILLARY
Glucose-Capillary: 168 mg/dL — ABNORMAL HIGH (ref 70–99)
Glucose-Capillary: 195 mg/dL — ABNORMAL HIGH (ref 70–99)
Glucose-Capillary: 207 mg/dL — ABNORMAL HIGH (ref 70–99)
Glucose-Capillary: 197 mg/dL — ABNORMAL HIGH (ref 70–99)

## 2023-02-13 MED ORDER — MELATONIN 5 MG PO TABS
5.0000 mg | ORAL_TABLET | Freq: Every evening | ORAL | Status: DC | PRN
  Administered 2023-02-13 – 2023-02-17 (×3): 5 mg via ORAL
  Filled 2023-02-13 (×3): qty 1

## 2023-02-13 MED ORDER — GUAIFENESIN 100 MG/5ML PO LIQD
5.0000 mL | ORAL | Status: DC | PRN
  Administered 2023-02-13 – 2023-02-15 (×4): 5 mL via ORAL
  Filled 2023-02-13 (×4): qty 5

## 2023-02-13 NOTE — Plan of Care (Signed)
  Problem: Education: Goal: Ability to describe self-care measures that may prevent or decrease complications (Diabetes Survival Skills Education) will improve Outcome: Progressing Goal: Individualized Educational Video(s) Outcome: Progressing   Problem: Coping: Goal: Ability to adjust to condition or change in health will improve Outcome: Progressing   Problem: Fluid Volume: Goal: Ability to maintain a balanced intake and output will improve Outcome: Progressing   Problem: Health Behavior/Discharge Planning: Goal: Ability to identify and utilize available resources and services will improve Outcome: Progressing Goal: Ability to manage health-related needs will improve Outcome: Progressing   Problem: Metabolic: Goal: Ability to maintain appropriate glucose levels will improve Outcome: Progressing   Problem: Nutritional: Goal: Maintenance of adequate nutrition will improve Outcome: Progressing Goal: Progress toward achieving an optimal weight will improve Outcome: Progressing   Problem: Skin Integrity: Goal: Risk for impaired skin integrity will decrease Outcome: Progressing   Problem: Tissue Perfusion: Goal: Adequacy of tissue perfusion will improve Outcome: Progressing   Problem: Safety: Goal: Non-violent Restraint(s) Outcome: Progressing   Problem: Education: Goal: Knowledge of General Education information will improve Description: Including pain rating scale, medication(s)/side effects and non-pharmacologic comfort measures Outcome: Progressing   Problem: Health Behavior/Discharge Planning: Goal: Ability to manage health-related needs will improve Outcome: Progressing   Problem: Clinical Measurements: Goal: Ability to maintain clinical measurements within normal limits will improve Outcome: Progressing Goal: Will remain free from infection Outcome: Progressing Goal: Diagnostic test results will improve Outcome: Progressing Goal: Respiratory complications  will improve Outcome: Progressing Goal: Cardiovascular complication will be avoided Outcome: Progressing   Problem: Activity: Goal: Risk for activity intolerance will decrease Outcome: Progressing   Problem: Nutrition: Goal: Adequate nutrition will be maintained Outcome: Progressing   Problem: Coping: Goal: Level of anxiety will decrease Outcome: Progressing   Problem: Elimination: Goal: Will not experience complications related to bowel motility Outcome: Progressing Goal: Will not experience complications related to urinary retention Outcome: Progressing   Problem: Pain Managment: Goal: General experience of comfort will improve Outcome: Progressing   Problem: Safety: Goal: Ability to remain free from injury will improve Outcome: Progressing   Problem: Skin Integrity: Goal: Risk for impaired skin integrity will decrease Outcome: Progressing   

## 2023-02-13 NOTE — TOC Progression Note (Addendum)
Transition of Care [facility]) - Progression Note    Patient Details  Name: Michelle [name] MRN: [id number] Date of Birth: 11-12-1932  Transition of Care [facility]) CM/SW Contact  Baldemar Lenis, [place] Phone Number: 02/13/2023, 3:19 PM  Clinical Narrative:   CSW updated by rehab admissions that the denial for CIR was upheld after appeal. CSW met with son, [name], at bedside to discuss option of SNF vs home with home health. Per [name], home is not an option, patient will need SNF. CSW explained SNF placement and answered questions, [name] asked for CSW to come back once his brother, [name], was here to discuss with both of them. CSW received permission to fax out referral, will follow up with sons to answer questions and provide bed offers.  UPDATE: CSW met with son, [name], again, who indicated that [name] is sick and not coming to the hospital today. CSW provided bed offers to [name], he will discuss with [name]. CSW answered questions. Son was asking about a palliative consult, CSW sent message to MD. CSW to follow.    Expected Discharge Plan: Skilled Nursing Facility Barriers to Discharge: Continued Medical Work up, English as a second language teacher  Expected Discharge Plan and Services                                               Social Determinants of Health (SDOH) Interventions SDOH Screenings   Food Insecurity: No Food Insecurity (02/07/2023)  Housing: Patient Declined (02/07/2023)  Transportation Needs: No Transportation Needs (02/07/2023)  Utilities: Not At Risk (02/07/2023)  Depression (PHQ2-9): Medium Risk (08/19/2022)  Financial Resource Strain: Low Risk  (06/20/2022)  Physical Activity: Inactive (06/20/2022)  Social Connections: Socially Isolated (06/20/2022)  Stress: No Stress Concern Present (06/20/2022)  Tobacco Use: Medium Risk (02/07/2023)    Readmission Risk Interventions    09/01/2020   12:30 PM  Readmission Risk Prevention Plan  Transportation Screening Complete  PCP or  Specialist Appt within 3-5 Days Complete  HRI or Home Care Consult Complete  Social Work Consult for Recovery Care Planning/Counseling Complete  Palliative Care Screening Not Applicable  Medication Review [employer]) Complete

## 2023-02-13 NOTE — Progress Notes (Addendum)
PROGRESS NOTE    Michelle [name]  ZOX:[id number] DOB: 18-Feb-1933 DOA: 02/06/2023 PCP: Shelva Majestic, MD   Brief Narrative:  Patient is an 89 year old female with past medical history of hypertension, diabetes type 2, hyperlipidemia, dementia, coronary disease, COPD with 55-pack-year smoking history presenting as a trauma after seizure while falling from a seated position on the toilet.  Seizure appears of lasted approximately 2 minutes, patient markedly confused postevent combative with EMS requiring Versed.  Patient ultimately intubated due to combative nature and concern for trauma.  PCCM and trauma teams consulted initially for admission.  Patient's acute metabolic encephalopathy continues to improve slowly, no further episodes of seizures at this time but no clear etiology for seizure provocation either.  Patient ultimately extubated 7/22, resumed on home Lamictal 25 twice daily.  Patient transition to general medical floor with improvement daily but quite slow.  Continues to be agitated difficult to orient, likely some underlying hospital delirium as well.  Occasionally requiring restraints and mittens to protect lines and telemetry.  Patient more awake alert today with family at bedside attempting to feed herself breakfast. Overnight 7/25 patient noted to transition to A-fib with transient RVR now back to sinus rhythm this morning. Morning of the 26th patient is markedly improved alert oriented to person place situation and time, only orienting question incorrect was year which son states would not be abnormal for her.  At this time patient remains medically stable for discharge, plan to transfer patient to inpatient rehab, insurance currently denied patient's request and has upheld the appeal. Staff working to discharge to SNF pending bed availability and insurance authorization.  Assessment & Plan:   Principal Problem:   Seizure (HCC) Active Problems:   Altered mental status   Scalp  laceration  Acute metabolic encephalopathy, secondary to presumed breakthrough seizure, resolving Complicated by underlying dementia, trauma and head laceration Complicated by sundowning/hospital delirium -Unclear etiology of breakthrough seizure, continue patient on home Lamictal per neurology -Trauma initially following, appears stable, continue wound care over scalp laceration -Patient remains seizure-free per EEG -Hold any further CNS depressing meds, follow clinically (continue to wean off oxycodone as possible in the setting of trauma) -Zyprexa as needed for agitation overnight, delirium precautions ongoing -Mental status markedly improved overnight, ANO x 3, feeding herself in bedside chair speaking coherently with family at bedside -Ambulatory dysfunction ongoing - fell from seated position 7/27 without incident/injury  Acute hypoxic resp failure requiring mechanical ventilation Rule out aspiration pneumonitis COPD, questionably in exacerbation:  Continue on advair and spiriva patient's home medications 55-pack-year smoking history, quit smoking 2008 Lung nodule measure up to 2 cm Currently on room air at rest - ambulatory oxygen screen pending Acute event 7/24 morning likely aspiration of breakfast with transient respiratory distress - now on room air -breo and incruse ellipta; prn duoneb for wheezing -will need repeat CT in 3 months for lung nodule -defer to primary pulmonologist for his PCP   Acute transient episode of A-fib with RVR, likely provoked, resolved -Complicated by above, new stroke, hypoxia, agitation confusion sundowning place patient at high risk for event -Sinus rhythm on EKG overnight and again this morning, Cardizem x 1 given -hold anticoagulation given transient event -May benefit from Holter monitor at discharge  Acute metabolic acidosis Lactic acidosis, resolved -Resolved, continue to follow labs, bicarb trending up appropriately   Hypotension History  of hypertension -Without shock or need for pressors -Hold sedating medications, hold amlodipine, carvedilol, enalapril, hydralazine, furosemide per home med rec -Blood pressure  remains labile in the setting of agitation and sedation, follow closely, continues on telemetry  Past medical history, stable CAD Hyperlipidemia -hold asa and plavix -Hold antihypertensives as above due to hypotension   Multi-septated cystic ovarian neoplasm -will need to f/u outpt w/ GYN (will likely need hysterectomy)   T2DM, well-controlled -Well-controlled A1c 5.5 continue sliding scale insulin hypoglycemic protocol   Depression -Continue home sertraline, hold nortriptyline  DVT prophylaxis: SCDs Start: 02/06/23 0402 Code Status:   Code Status: Full Code Family Communication: Son at bedside  Status is: Inpatient  Dispo: The patient is from: Home              Anticipated d/c is to: CIR              Anticipated d/c date is: Imminent              Patient currently IS medically stable for discharge  Consultants:  PCCM, neurology, trauma  Procedures:  None  Antimicrobials:  None indicated  Subjective: Patient slid out of chair yesterday afternoon without injury - no events noted overnight.  Denies vomiting but admits to mild nausea, denies headache fever chills shortness of breath or chest pain  Objective: Vitals:   02/12/23 2351 02/13/23 0407 02/13/23 0500 02/13/23 0748  BP: (!) 144/78 (!) 140/71  (!) 147/72  Pulse: (!) 113 (!) 118  (!) 113  Resp: 19 18  17   Temp: 98.5 F (36.9 C) 98.8 F (37.1 C)  98.1 F (36.7 C)  TempSrc: Oral Oral  Oral  SpO2: 97% 93%  100%  Weight:   83.7 kg   Height:        Intake/Output Summary (Last 24 hours) at 02/13/2023 0981 Last data filed at 02/12/2023 2130 Gross per 24 hour  Intake --  Output 1100 ml  Net -1100 ml    Filed Weights   02/10/23 0500 02/11/23 0500 02/13/23 0500  Weight: 82.6 kg 82.4 kg 83.7 kg    Examination:  General:   Pleasantly sitting up in bedside chair eating breakfast on her own alert oriented x 3 HEENT: Head laceration, superficial over the right parietal area Neck:  Without mass or deformity.  Trachea is midline. Lungs:  Clear to auscultate bilaterally without rhonchi, wheeze, or rales. Heart:  Regular rate and rhythm.  Without murmurs, rubs, or gallops. Abdomen:  Soft, nontender, nondistended.  Without guarding or rebound. Extremities: Without cyanosis, clubbing, edema, or obvious deformity. Skin:  Warm and dry, no erythema.  Data Reviewed: I have personally reviewed following labs and imaging studies  CBC: Recent Labs  Lab 02/06/23 1405 02/07/23 0708 02/08/23 0043 02/09/23 0259 02/10/23 0604  WBC 12.6* 7.3 9.9 11.7* 8.8  NEUTROABS  --  4.9  --   --   --   HGB 10.4* 9.5* 9.8* 12.0 9.8*  HCT 31.0* 28.8* 29.2* 36.1 30.9*  MCV 89.3 90.0 85.9 88.3 90.1  PLT 215 226 242 238 241   Basic Metabolic Panel: Recent Labs  Lab 02/06/23 1405 02/06/23 1748 02/07/23 0708 02/07/23 1746 02/08/23 0043 02/09/23 0259 02/10/23 0604  NA 135  --  137  --  140 138 140  K 4.1  --  3.6  --  3.5 3.6 3.8  CL 109  --  110  --  108 109 107  CO2 15*  --  19*  --  21* 19* 23  GLUCOSE 114*  --  100*  --  132* 126* 146*  BUN 21  --  15  --  14 18 17   CREATININE 0.86  --  0.81  --  0.83 0.94 0.83  CALCIUM 8.5*  --  8.3*  --  8.9 8.8* 8.6*  MG 1.1* 2.3 2.2 2.2  --   --   --   PHOS 1.8* 1.6* 3.0 2.1*  --   --   --    GFR: Estimated Creatinine Clearance: 50.1 mL/min (by C-G formula based on SCr of 0.83 mg/dL).  Liver Function Tests: Recent Labs  Lab 02/07/23 0708  AST 25  ALT 16  ALKPHOS 27*  BILITOT 0.4  PROT 5.9*  ALBUMIN 3.1*    No results for input(s): "AMMONIA" in the last 168 hours.  Coagulation Profile: No results for input(s): "INR", "PROTIME" in the last 168 hours.  CBG: Recent Labs  Lab 02/12/23 0806 02/12/23 1144 02/12/23 1727 02/12/23 2130 02/13/23 0618  GLUCAP 143* 208*  159* 158* 168*   Lipid Profile: Recent Labs    02/13/23 0522  TRIG 243*   Sepsis Labs: Recent Labs  Lab 02/06/23 1405  LATICACIDVEN 1.8    Recent Results (from the past 240 hour(s))  Blood culture (routine x 2)     Status: None   Collection Time: 02/06/23  2:35 AM   Specimen: BLOOD RIGHT HAND  Result Value Ref Range Status   Specimen Description BLOOD RIGHT HAND  Final   Special Requests   Final    BOTTLES DRAWN AEROBIC AND ANAEROBIC Blood Culture adequate volume   Culture   Final    NO GROWTH 5 DAYS Performed at [facility], [address], [place], [place] [address]    Report Status 02/11/2023 FINAL  Final  Blood culture (routine x 2)     Status: None   Collection Time: 02/06/23  2:42 AM   Specimen: BLOOD LEFT HAND  Result Value Ref Range Status   Specimen Description BLOOD LEFT HAND  Final   Special Requests   Final    BOTTLES DRAWN AEROBIC AND ANAEROBIC Blood Culture results may not be optimal due to an inadequate volume of blood received in culture bottles   Culture   Final    NO GROWTH 5 DAYS Performed at [facility], [address], [place], [place] [address]    Report Status 02/11/2023 FINAL  Final  MRSA Next Gen by PCR, Nasal     Status: None   Collection Time: 02/06/23  5:21 AM   Specimen: Nasal Mucosa; Nasal Swab  Result Value Ref Range Status   MRSA by PCR Next Gen NOT DETECTED NOT DETECTED Final    Comment: (NOTE) The GeneXpert MRSA Assay (FDA approved for NASAL specimens only), is one component of a comprehensive MRSA colonization surveillance program. It is not intended to diagnose MRSA infection nor to guide or monitor treatment for MRSA infections. Test performance is not FDA approved in patients less than 20 years old. Performed at [facility], [address], [place], [place] [address]   Urine Culture (for pregnant, neutropenic or urologic patients or patients with an indwelling urinary catheter)     Status: None    Collection Time: 02/07/23  8:03 AM   Specimen: Urine, Clean Catch  Result Value Ref Range Status   Specimen Description URINE, CLEAN CATCH  Final   Special Requests NONE  Final   Culture   Final    NO GROWTH Performed at [facility], [address], [place], [place] [address]    Report Status 02/08/2023  FINAL  Final         Radiology Studies: No results found.      Scheduled Meds:  diclofenac Sodium  2 g Topical QID   feeding supplement  237 mL Oral BID BM   fluticasone furoate-vilanterol  1 puff Inhalation Daily   insulin aspart  0-20 Units Subcutaneous TID WC   insulin aspart  0-5 Units Subcutaneous QHS   lamoTRIgine  25 mg Oral BID   nystatin  5 mL Oral QID   mouth rinse  15 mL Mouth Rinse 4 times per day   pantoprazole  40 mg Oral Daily   rosuvastatin  40 mg Oral Daily   sertraline  100 mg Oral QHS   umeclidinium bromide  1 puff Inhalation Daily   Continuous Infusions:  sodium chloride     0.9 % NaCl with KCl 20 mEq / L Stopped (02/09/23 0935)     LOS: 7 days   Time spent:  Azucena Fallen, DO [facility]  If 7PM-7AM, please contact night-coverage [email or web address]  02/13/2023, 8:22 AM

## 2023-02-13 NOTE — Progress Notes (Signed)
Inpatient Rehab Admissions Coordinator:  Saw pt, son and granddaughter at bedside. Informed them that the denial was upheld after the expedited appeal. TOC made aware.   Wolfgang Phoenix, MS, CCC-SLP Admissions Coordinator [phone]

## 2023-02-13 NOTE — Progress Notes (Signed)
Nutrition Follow-up  DOCUMENTATION CODES:   Not applicable  INTERVENTION:  Continue dysphagia 3 diet per SLP Ensure Enlive po BID, each supplement provides 350 kcal and 20 grams of protein. Magic cup BID with meals, each supplement provides 290 kcal and 9 grams of protein  NUTRITION DIAGNOSIS:   Inadequate oral intake related to acute illness as evidenced by meal completion < 50%. 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  Medically stable for discharge. Insurance denied CIR. Pending SNF placement.  No meal completions documented since 7/25. Spoke with pt and her son at bedside. Her appetite is still not quite back to baseline. She is eating <50% of her meals. Her son recalls that she consumed almost 100% of breakfast yesterday which has been her best meal. Today she only took a couple bites of breakfast and lunch, however she choked on sausage this morning. Reached out to SLP to follow up for recommendations.    Pt continuing Ensure BID and willing to add Magic Cup to trays to enhance nutritional intake.   Medications: SSI 0-20 units TID, SSI 0-5 units at bedtime, nystatin, protonix  Labs: CBG's 143-208 x24 hours  Diet Order:   Diet Order             DIET DYS 3 Room service appropriate? No; Fluid consistency: Thin  Diet effective now                   EDUCATION NEEDS:   Not appropriate for education at this time  Skin:  Skin Assessment: Skin Integrity  Issues: Skin Integrity Issues:: Stage II Stage II: mid sacrum  Last BM:  7/29 (type 3 medium)  Height:   Ht Readings from Last 1 Encounters:  02/07/23 5\' 5"  (1.651 m)    Weight:   Wt Readings from Last 1 Encounters:  02/13/23 83.7 kg   BMI:  Body mass index is 30.71 kg/m.  Estimated Nutritional Needs:   Kcal:  1600-1800  Protein:  95-105 grams  Fluid:  >1.6 L/day  Drusilla Kanner, RDN, LDN Clinical Nutrition

## 2023-02-13 NOTE — Progress Notes (Signed)
Physical Therapy Treatment  Patient Details Name: Michelle [name] MRN: [id number] DOB: May 30, 1933 Today's Date: 02/13/2023   History of Present Illness Pt is an 89 y.o. female who presents to [facility] hospital on 02/06/2023 after having a seizure, falling and striking her head. Pt intubated in the ED, extubated same date. PMH includes HTN, seizures, DMII, HLD, dementia, CAD, COPD.    PT Comments  Pt progressing towards physical therapy goals. Pt lethargic and falling asleep throughout session but able to rouse easily. Heavy +2 assist required for basic transfers this session and unable to progress to gait training. Will continue to follow and progress as able per POC.    If plan is discharge home, recommend the following: Two people to help with walking and/or transfers;Assistance with cooking/housework;Two people to help with bathing/dressing/bathroom;Direct supervision/assist for medications management;Direct supervision/assist for financial management;Assist for transportation;Help with stairs or ramp for entrance   Can travel by private vehicle        Equipment Recommendations  Other (comment) (TBD by next venue of care)    Recommendations for Other Services Rehab consult     Precautions / Restrictions Precautions Precautions: Fall Precaution Comments: seizure Restrictions Weight Bearing Restrictions: No     Mobility  Bed Mobility               General bed mobility comments: Pt was received sitting up on the [facility] with nursing present.    Transfers Overall transfer level: Needs assistance Equipment used: Rolling walker (2 wheels) Transfers: Sit to/from Stand, Bed to chair/wheelchair/BSC Sit to Stand: Mod assist, +2 physical assistance   Step pivot transfers: Mod assist, +2 physical assistance, +2 safety/equipment       General transfer comment: Heavy assist to power up to full stand from [facility]. Multimodal cues for transition of hands from [facility] to walker, and pt with a  posterior lean when attempting to do so. Poor transfer BSC to EOB with RW. Attempted step-pivot with RW to recliner but pt not able to tolerate without uncontrolled return to sitting on EOB. 2 person assist utilized for step-to transition bed>chair.    Ambulation/Gait               General Gait Details: Unable to tolerate ambulation this session.   Stairs             Wheelchair Mobility     Tilt Bed    Modified Rankin (Stroke Patients Only)       Balance Overall balance assessment: Needs assistance Sitting-balance support: Feet supported Sitting balance-Leahy Scale: Fair Sitting balance - Comments: min guard for sitting balance Postural control: Posterior lean Standing balance support: Bilateral upper extremity supported Standing balance-Leahy Scale: Poor Standing balance comment: reliant on RW for standing balance                            Cognition Arousal/Alertness: Awake/alert Behavior During Therapy: WFL for tasks assessed/performed Overall Cognitive Status: Impaired/Different from baseline Area of Impairment: Orientation, Attention, Memory, Following commands, Safety/judgement, Awareness, Problem solving                 Orientation Level: Disoriented to, Place Current Attention Level: Sustained, Focused Memory: Decreased short-term memory Following Commands: Follows one step commands with increased time Safety/Judgement: Decreased awareness of safety, Decreased awareness of deficits Awareness: Intellectual Problem Solving: Difficulty sequencing, Requires verbal cues, Requires tactile cues, Slow processing          Exercises General Exercises -  Lower Extremity Long Arc Quad: AROM, Right, Left, 10 reps, Seated    General Comments        Pertinent Vitals/Pain Pain Assessment Pain Assessment: Faces Faces Pain Scale: Hurts even more Pain Location: bil knees Pain Descriptors / Indicators: Grimacing, Guarding, Aching  ("popping") Pain Intervention(s): Limited activity within patient's tolerance, Monitored during session, Repositioned    Home Living                          Prior Function            PT Goals (current goals can now be found in the care plan section) Acute Rehab PT Goals Patient Stated Goal: did not state PT Goal Formulation: With patient/family Time For Goal Achievement: 02/20/23 Potential to Achieve Goals: Good Progress towards PT goals: Progressing toward goals    Frequency    Min 1X/week      PT Plan Current plan remains appropriate    Co-evaluation              AM-PAC PT "6 Clicks" Mobility   Outcome Measure  Help needed turning from your back to your side while in a flat bed without using bedrails?: A Lot Help needed moving from lying on your back to sitting on the side of a flat bed without using bedrails?: A Lot Help needed moving to and from a bed to a chair (including a wheelchair)?: Total Help needed standing up from a chair using your arms (e.g., wheelchair or bedside chair)?: Total Help needed to walk in hospital room?: Total Help needed climbing 3-5 steps with a railing? : Total 6 Click Score: 8    End of Session Equipment Utilized During Treatment: Gait belt Activity Tolerance: Patient tolerated treatment well Patient left: in chair;with call bell/phone within reach;with chair alarm set;with family/visitor present Nurse Communication: Mobility status PT Visit Diagnosis: Other abnormalities of gait and mobility (R26.89);Muscle weakness (generalized) (M62.81);Other symptoms and signs involving the nervous system (R29.898)     Time: 5284-1324 PT Time Calculation (min) (ACUTE ONLY): 32 min  Charges:    $Gait Training: 23-37 mins PT General Charges $$ ACUTE PT VISIT: 1 Visit                     Conni Slipper, PT, DPT Acute Rehabilitation Services Secure Chat Preferred Office: [phone]    Marylynn Pearson 02/13/2023, 1:27  PM

## 2023-02-13 NOTE — NC FL2 (Signed)
[place] MEDICAID FL2 LEVEL OF CARE FORM     IDENTIFICATION  Patient Name: Michelle [name] Birthdate: [birth year] Sex: female Admission Date (Current Location): 02/06/2023  [facility] and [place] Number:  Producer, television/film/video and Address:  The . [facility], [address] [place], [place] [address]      Provider Number: [id number]  Attending Physician Name and Address:  Azucena Fallen, MD  Relative Name and Phone Number:       Current Level of Care: Hospital Recommended Level of Care: Skilled Nursing Facility Prior Approval Number:    Date Approved/Denied:   PASRR Number: [id number]  Discharge Plan: SNF    Current Diagnoses: Patient Active Problem List   Diagnosis Date Noted   Altered mental status 02/07/2023   Scalp laceration 02/07/2023   Seizure (HCC) 02/06/2023   Memory impairment 09/09/2022   Chronic pain of both knees 04/05/2022   Epilepsy (HCC) 03/12/2021   Adnexal mass 11/24/2020   Thickened endometrium 11/24/2020   Dementia (HCC) 10/22/2020   Delirium 06/25/2020   Actinic keratosis 11/17/2017   Anemia, iron deficiency 10/23/2015   CKD (chronic kidney disease), stage III (HCC) 07/25/2014   Osteoarthritis, knee 05/13/2014   Anxiety state 04/21/2014   Chronic diastolic CHF (congestive heart failure) (HCC) 03/13/2014   SOB (shortness of breath) 03/05/2014   CAD- RCA PCI '90s, STEMI-RCA DES 02/18/14 03/05/2014   Back pain, lumbosacral 10/10/2012   Type 2 diabetes mellitus without complication, with long-term current use of insulin (HCC) 03/26/2007   Hyperlipidemia associated with type 2 diabetes mellitus (HCC) 03/26/2007   Essential hypertension 04/21/2006   COPD (chronic obstructive pulmonary disease) (HCC) 04/21/2006    Orientation RESPIRATION BLADDER Height & Weight     Self, Time, Place  Normal Incontinent Weight: 184 lb 8.4 oz (83.7 kg) Height:  5\' 5"  (165.1 cm)  BEHAVIORAL SYMPTOMS/MOOD NEUROLOGICAL BOWEL NUTRITION STATUS     Convulsions/Seizures Incontinent Diet (see DC summary)  AMBULATORY STATUS COMMUNICATION OF NEEDS Skin   Limited Assist Verbally PU Stage and Appropriate Care, Skin abrasions (skin tear, left pretibial: foam dressing, lift every shift to assess and change PRN; head laceration, no dressing)   PU Stage 2 Dressing:  (sacrum: foam dressing, lift every shift to assess and change PRN)                   Personal Care Assistance Level of Assistance  Bathing, Feeding, Dressing Bathing Assistance: Limited assistance Feeding assistance: Limited assistance Dressing Assistance: Limited assistance     Functional Limitations Info  Sight Sight Info: Impaired (glasses)        SPECIAL CARE FACTORS FREQUENCY  PT (By licensed PT), OT (By licensed OT), Speech therapy     PT Frequency: 5x/wk OT Frequency: 5x/wk     Speech Therapy Frequency: 5x/wk      Contractures Contractures Info: Not present    Additional Factors Info  Code Status, Allergies, Psychotropic, Insulin Sliding Scale Code Status Info: Full Allergies Info: Codeine Sulfate, Morphine Sulfate Psychotropic Info: Zoloft 100mg  daily at bed Insulin Sliding Scale Info: sse DC summary       Current Medications (02/13/2023):  This is the current hospital active medication list Current Facility-Administered Medications  Medication Dose Route Frequency Provider Last Rate Last Admin   0.9 %  sodium chloride infusion  250 mL Intravenous Continuous Paliwal, Aditya, MD       0.9 % NaCl with KCl 20 mEq/ L  infusion   Intravenous Continuous Crosley, Debby,  MD   Held at 02/09/23 0935   acetaminophen (TYLENOL) tablet 650 mg  650 mg Oral Q6H PRN Azucena Fallen, MD   650 mg at 02/11/23 0010   diclofenac Sodium (VOLTAREN) 1 % topical gel 2 g  2 g Topical QID Bevelyn Ngo, NP   2 g at 02/13/23 0831   docusate (COLACE) 50 MG/5ML liquid 100 mg  100 mg Oral BID PRN Hunsucker, Lesia Sago, MD       feeding supplement (ENSURE ENLIVE / ENSURE  PLUS) liquid 237 mL  237 mL Oral BID BM Hunsucker, Lesia Sago, MD   237 mL at 02/13/23 0832   fluticasone furoate-vilanterol (BREO ELLIPTA) 200-25 MCG/ACT 1 puff  1 puff Inhalation Daily Iffany, Mcadam, PA-C   1 puff at 02/13/23 0806   insulin aspart (novoLOG) injection 0-20 Units  0-20 Units Subcutaneous TID WC Azucena Fallen, MD   4 Units at 02/13/23 0831   insulin aspart (novoLOG) injection 0-5 Units  0-5 Units Subcutaneous QHS Azucena Fallen, MD   2 Units at 02/11/23 2109   ipratropium-albuterol (DUONEB) 0.5-2.5 (3) MG/3ML nebulizer solution 3 mL  3 mL Nebulization Q4H PRN Pia Mau D, PA-C   3 mL at 02/12/23 1312   lamoTRIgine (LAMICTAL) tablet 25 mg  25 mg Oral BID Hetty Blend C, NP   25 mg at 02/13/23 4098   metoprolol tartrate (LOPRESSOR) injection 5 mg  5 mg Intravenous Once PRN Crosley, Debby, MD       nystatin (MYCOSTATIN) 100000 UNIT/ML suspension 500,000 Units  5 mL Oral QID Azucena Fallen, MD   500,000 Units at 02/13/23 0831   Oral care mouth rinse  15 mL Mouth Rinse 4 times per day Karren Burly, MD   15 mL at 02/13/23 0831   Oral care mouth rinse  15 mL Mouth Rinse PRN Hunsucker, Lesia Sago, MD       pantoprazole (PROTONIX) EC tablet 40 mg  40 mg Oral Daily Hunsucker, Lesia Sago, MD   40 mg at 02/13/23 0831   polyethylene glycol (MIRALAX / GLYCOLAX) packet 17 g  17 g Oral Daily PRN Hunsucker, Lesia Sago, MD       rosuvastatin (CRESTOR) tablet 40 mg  40 mg Oral Daily Kialey, Prettyman, PA-C   40 mg at 02/13/23 0831   sertraline (ZOLOFT) tablet 100 mg  100 mg Oral QHS Crosley, Debby, MD   100 mg at 02/12/23 2119   umeclidinium bromide (INCRUSE ELLIPTA) 62.5 MCG/ACT 1 puff  1 puff Inhalation Daily Mikiah, Kobs, PA-C   1 puff at 02/13/23 1191     Discharge Medications: Please see discharge summary for a list of discharge medications.  Relevant Imaging Results:  Relevant Lab Results:   Additional Information SS#: [id number]  Baldemar Lenis,  LCSW

## 2023-02-13 NOTE — Progress Notes (Signed)
Occupational Therapy Treatment Patient Details Name: Michelle [name] MRN: [id number] DOB: Feb 23, 1933 Today's Date: 02/13/2023   History of present illness 89 y.o. female who presents to [facility] hospital on 02/06/2023 after having a seizure, falling and striking her head. Pt intubated in the ED, extubated same date. PMH includes HTN, seizures, DMII, HLD, dementia, CAD, COPD.   OT comments  Pt progressed from chair to the bathroom with eva walker/ RW during session. Pt requires forearm support to help balance and pain management. Recommendation for next session to use platforms on RW. Pt with cognitive deficits and observed trying to get out of chair on arrival due to internal need to go to the bathroom. Recommendation updated to skilled inpatient follow up therapy, <3 hours/day due to inpatient rehab denial at this time.           Call me [name]       Family could benefit from home information for 20 days post skilled inpatient placement and potential coverage  Recommendations for follow up therapy are one component of a multi-disciplinary discharge planning process, led by the attending physician.  Recommendations may be updated based on patient status, additional functional criteria and insurance authorization.    Assistance Recommended at Discharge Intermittent Supervision/Assistance  Patient can return home with the following  A lot of help with walking and/or transfers;A lot of help with bathing/dressing/bathroom;Two people to help with walking and/or transfers   Equipment Recommendations  Wheelchair (measurements OT);Wheelchair cushion (measurements OT)    Recommendations for Other Services      Precautions / Restrictions Precautions Precautions: Fall Precaution Comments: seizure Restrictions Weight Bearing Restrictions: No       Mobility Bed Mobility               General bed mobility comments: oob on arrival in chair    Transfers Overall transfer level: Needs  assistance Equipment used: Rolling walker (2 wheels) (eva walker) Transfers: Sit to/from Stand Sit to Stand: Mod assist           General transfer comment: pt sit<>Stand mod (A) with bil Ue on arm rest. pt leaning on RW heavily total +2 max (A) to the bathroom and so eva walker used second attempt with max (A). recommendation for trial of platform on RW     Balance Overall balance assessment: Needs assistance Sitting-balance support: Bilateral upper extremity supported, Feet supported Sitting balance-Leahy Scale: Fair     Standing balance support: Bilateral upper extremity supported, During functional activity, Reliant on assistive device for balance Standing balance-Leahy Scale: Zero                             ADL either performed or assessed with clinical judgement   ADL Overall ADL's : Needs assistance/impaired Eating/Feeding: Minimal assistance   Grooming: Minimal assistance;Wash/dry hands       Lower Body Bathing: Maximal assistance           Toilet Transfer: +2 for physical assistance;+2 for safety/equipment;Maximal assistance   Toileting- Clothing Manipulation and Hygiene: +2 for safety/equipment;+2 for physical assistance;Maximal assistance       Functional mobility during ADLs: Maximal assistance      Extremity/Trunk Assessment Upper Extremity Assessment RUE Deficits / Details: leaning on walker and not using wrist support LUE Deficits / Details: leaning on the walker and not using wrist            Vision       Perception  Praxis      Cognition Arousal/Alertness: Awake/alert Behavior During Therapy: WFL for tasks assessed/performed Overall Cognitive Status: Impaired/Different from baseline Area of Impairment: Orientation, Attention, Memory, Following commands, Safety/judgement, Awareness, Problem solving                 Orientation Level: Disoriented to, Situation Current Attention Level: Sustained Memory: Decreased  recall of precautions, Decreased short-term memory Following Commands: Follows multi-step commands inconsistently, Follows multi-step commands with increased time Safety/Judgement: Decreased awareness of safety, Decreased awareness of deficits Awareness: Intellectual Problem Solving: Slow processing General Comments: pt needs cues to sequence transfers but more automatic with adl task. pt able to sequene and intiate peri hygiene        Exercises      Shoulder Instructions       General Comments VSS on RA-- pt reports SOB but noted to have saturations of 96%    Pertinent Vitals/ Pain       Pain Assessment Pain Assessment: Faces Faces Pain Scale: Hurts even more Pain Location: bil knees Pain Descriptors / Indicators: Discomfort, Grimacing, Guarding Pain Intervention(s): Monitored during session, Limited activity within patient's tolerance, Repositioned  Home Living                                          Prior Functioning/Environment              Frequency  Min 1X/week        Progress Toward Goals  OT Goals(current goals can now be found in the care plan section)  Progress towards OT goals: Progressing toward goals  Acute Rehab OT Goals Patient Stated Goal: to get to the bathroom OT Goal Formulation: With patient Time For Goal Achievement: 02/23/23 Potential to Achieve Goals: Good ADL Goals Pt Will Perform Eating: with modified independence;sitting;with min assist Pt Will Perform Grooming: sitting;with min assist Pt Will Perform Upper Body Bathing: with min assist;sitting Pt Will Transfer to Toilet: with mod assist;stand pivot transfer;bedside commode Additional ADL Goal #1: pt will complete bed mobility min (A) as precursor to adls.  Plan Discharge plan remains appropriate    Co-evaluation                 AM-PAC OT "6 Clicks" Daily Activity     Outcome Measure   Help from another person eating meals?: A Little Help from  another person taking care of personal grooming?: A Little Help from another person toileting, which includes using toliet, bedpan, or urinal?: A Little Help from another person bathing (including washing, rinsing, drying)?: A Lot Help from another person to put on and taking off regular upper body clothing?: A Lot Help from another person to put on and taking off regular lower body clothing?: Total 6 Click Score: 14    End of Session Equipment Utilized During Treatment: Gait belt;Rolling walker (2 wheels)  OT Visit Diagnosis: Unsteadiness on feet (R26.81);Repeated falls (R29.6);Muscle weakness (generalized) (M62.81)   Activity Tolerance Patient tolerated treatment well   Patient Left in chair;with call bell/phone within reach;with chair alarm set;with family/visitor present   Nurse Communication Mobility status;Precautions        Time: 1233 (1610)-9604 OT Time Calculation (min): 41 min  Charges: OT General Charges $OT Visit: 1 Visit OT Treatments $Self Care/Home Management : 38-52 mins   Brynn, OTR/L  Acute Rehabilitation Services Office: [phone] .   Mateo Flow 02/13/2023, 1:56  PM

## 2023-02-14 DIAGNOSIS — R569 Unspecified convulsions: Secondary | ICD-10-CM | POA: Diagnosis not present

## 2023-02-14 LAB — GLUCOSE, CAPILLARY
Glucose-Capillary: 165 mg/dL — ABNORMAL HIGH (ref 70–99)
Glucose-Capillary: 204 mg/dL — ABNORMAL HIGH (ref 70–99)
Glucose-Capillary: 261 mg/dL — ABNORMAL HIGH (ref 70–99)
Glucose-Capillary: 162 mg/dL — ABNORMAL HIGH (ref 70–99)

## 2023-02-14 LAB — HEMOGLOBIN AND HEMATOCRIT, BLOOD
HCT: 32.6 % — ABNORMAL LOW (ref 36.0–46.0)
Hemoglobin: 10.3 g/dL — ABNORMAL LOW (ref 12.0–15.0)

## 2023-02-14 MED ORDER — SODIUM CHLORIDE 0.9 % IV BOLUS
500.0000 mL | Freq: Once | INTRAVENOUS | Status: AC
  Administered 2023-02-14: 500 mL via INTRAVENOUS

## 2023-02-14 NOTE — Plan of Care (Signed)
  Problem: Education: Goal: Ability to describe self-care measures that may prevent or decrease complications (Diabetes Survival Skills Education) will improve Outcome: Progressing Goal: Individualized Educational Video(s) Outcome: Progressing   Problem: Coping: Goal: Ability to adjust to condition or change in health will improve Outcome: Progressing   Problem: Fluid Volume: Goal: Ability to maintain a balanced intake and output will improve Outcome: Progressing   Problem: Health Behavior/Discharge Planning: Goal: Ability to identify and utilize available resources and services will improve Outcome: Progressing Goal: Ability to manage health-related needs will improve Outcome: Progressing   Problem: Metabolic: Goal: Ability to maintain appropriate glucose levels will improve Outcome: Progressing   Problem: Nutritional: Goal: Maintenance of adequate nutrition will improve Outcome: Progressing Goal: Progress toward achieving an optimal weight will improve Outcome: Progressing   Problem: Skin Integrity: Goal: Risk for impaired skin integrity will decrease Outcome: Progressing   Problem: Tissue Perfusion: Goal: Adequacy of tissue perfusion will improve Outcome: Progressing   Problem: Safety: Goal: Non-violent Restraint(s) Outcome: Progressing   Problem: Education: Goal: Knowledge of General Education information will improve Description: Including pain rating scale, medication(s)/side effects and non-pharmacologic comfort measures Outcome: Progressing   Problem: Health Behavior/Discharge Planning: Goal: Ability to manage health-related needs will improve Outcome: Progressing   Problem: Clinical Measurements: Goal: Ability to maintain clinical measurements within normal limits will improve Outcome: Progressing Goal: Will remain free from infection Outcome: Progressing Goal: Diagnostic test results will improve Outcome: Progressing Goal: Respiratory complications  will improve Outcome: Progressing Goal: Cardiovascular complication will be avoided Outcome: Progressing   Problem: Activity: Goal: Risk for activity intolerance will decrease Outcome: Progressing   Problem: Nutrition: Goal: Adequate nutrition will be maintained Outcome: Progressing   Problem: Coping: Goal: Level of anxiety will decrease Outcome: Progressing   Problem: Elimination: Goal: Will not experience complications related to bowel motility Outcome: Progressing Goal: Will not experience complications related to urinary retention Outcome: Progressing   Problem: Pain Managment: Goal: General experience of comfort will improve Outcome: Progressing   Problem: Safety: Goal: Ability to remain free from injury will improve Outcome: Progressing   Problem: Skin Integrity: Goal: Risk for impaired skin integrity will decrease Outcome: Progressing   

## 2023-02-14 NOTE — Plan of Care (Signed)
Problem: Education: Goal: Ability to describe self-care measures that may prevent or decrease complications (Diabetes Survival Skills Education) will improve 02/14/2023 2345 by Beryle Flock, RN Outcome: Progressing 02/14/2023 2344 by Beryle Flock, RN Outcome: Progressing Goal: Individualized Educational Video(s) 02/14/2023 2345 by Beryle Flock, RN Outcome: Progressing 02/14/2023 2344 by Beryle Flock, RN Outcome: Progressing   Problem: Coping: Goal: Ability to adjust to condition or change in health will improve 02/14/2023 2345 by Beryle Flock, RN Outcome: Progressing 02/14/2023 2344 by Beryle Flock, RN Outcome: Progressing   Problem: Fluid Volume: Goal: Ability to maintain a balanced intake and output will improve 02/14/2023 2345 by Beryle Flock, RN Outcome: Progressing 02/14/2023 2344 by Beryle Flock, RN Outcome: Progressing   Problem: Health Behavior/Discharge Planning: Goal: Ability to identify and utilize available resources and services will improve 02/14/2023 2345 by Beryle Flock, RN Outcome: Progressing 02/14/2023 2344 by Beryle Flock, RN Outcome: Progressing Goal: Ability to manage health-related needs will improve 02/14/2023 2345 by Beryle Flock, RN Outcome: Progressing 02/14/2023 2344 by Beryle Flock, RN Outcome: Progressing   Problem: Metabolic: Goal: Ability to maintain appropriate glucose levels will improve 02/14/2023 2345 by Beryle Flock, RN Outcome: Progressing 02/14/2023 2344 by Beryle Flock, RN Outcome: Progressing   Problem: Nutritional: Goal: Maintenance of adequate nutrition will improve 02/14/2023 2345 by Beryle Flock, RN Outcome: Progressing 02/14/2023 2344 by Beryle Flock, RN Outcome: Progressing Goal: Progress toward achieving an optimal weight will improve 02/14/2023 2345 by Beryle Flock, RN Outcome: Progressing 02/14/2023 2344 by Beryle Flock, RN Outcome: Progressing   Problem: Skin Integrity: Goal: Risk for impaired skin  integrity will decrease 02/14/2023 2345 by Beryle Flock, RN Outcome: Progressing 02/14/2023 2344 by Beryle Flock, RN Outcome: Progressing   Problem: Tissue Perfusion: Goal: Adequacy of tissue perfusion will improve 02/14/2023 2345 by Beryle Flock, RN Outcome: Progressing 02/14/2023 2344 by Beryle Flock, RN Outcome: Progressing   Problem: Safety: Goal: Non-violent Restraint(s) 02/14/2023 2345 by Beryle Flock, RN Outcome: Progressing 02/14/2023 2344 by Beryle Flock, RN Outcome: Progressing   Problem: Education: Goal: Knowledge of General Education information will improve Description: Including pain rating scale, medication(s)/side effects and non-pharmacologic comfort measures 02/14/2023 2345 by Beryle Flock, RN Outcome: Progressing 02/14/2023 2344 by Beryle Flock, RN Outcome: Progressing   Problem: Health Behavior/Discharge Planning: Goal: Ability to manage health-related needs will improve 02/14/2023 2345 by Beryle Flock, RN Outcome: Progressing 02/14/2023 2344 by Beryle Flock, RN Outcome: Progressing   Problem: Clinical Measurements: Goal: Ability to maintain clinical measurements within normal limits will improve 02/14/2023 2345 by Beryle Flock, RN Outcome: Progressing 02/14/2023 2344 by Beryle Flock, RN Outcome: Progressing Goal: Will remain free from infection 02/14/2023 2345 by Beryle Flock, RN Outcome: Progressing 02/14/2023 2344 by Beryle Flock, RN Outcome: Progressing Goal: Diagnostic test results will improve 02/14/2023 2345 by Beryle Flock, RN Outcome: Progressing 02/14/2023 2344 by Beryle Flock, RN Outcome: Progressing Goal: Respiratory complications will improve 02/14/2023 2345 by Beryle Flock, RN Outcome: Progressing 02/14/2023 2344 by Beryle Flock, RN Outcome: Progressing Goal: Cardiovascular complication will be avoided 02/14/2023 2345 by Beryle Flock, RN Outcome: Progressing 02/14/2023 2344 by Beryle Flock, RN Outcome: Progressing    Problem: Activity: Goal: Risk for activity intolerance will decrease 02/14/2023 2345 by Beryle Flock, RN Outcome: Progressing 02/14/2023 2344 by Beryle Flock, RN Outcome: Progressing   Problem: Nutrition: Goal: Adequate nutrition will be maintained 02/14/2023 2345 by Beryle Flock, RN Outcome: Progressing 02/14/2023 2344 by Beryle Flock, RN Outcome: Progressing   Problem: Coping: Goal: Level of anxiety will decrease 02/14/2023 2345 by Beryle Flock, RN Outcome: Progressing  02/14/2023 2344 by Beryle Flock, RN Outcome: Progressing   Problem: Elimination: Goal: Will not experience complications related to bowel motility 02/14/2023 2345 by Beryle Flock, RN Outcome: Progressing 02/14/2023 2344 by Beryle Flock, RN Outcome: Progressing Goal: Will not experience complications related to urinary retention 02/14/2023 2345 by Beryle Flock, RN Outcome: Progressing 02/14/2023 2344 by Beryle Flock, RN Outcome: Progressing   Problem: Pain Managment: Goal: General experience of comfort will improve 02/14/2023 2345 by Beryle Flock, RN Outcome: Progressing 02/14/2023 2344 by Beryle Flock, RN Outcome: Progressing   Problem: Safety: Goal: Ability to remain free from injury will improve 02/14/2023 2345 by Beryle Flock, RN Outcome: Progressing 02/14/2023 2344 by Beryle Flock, RN Outcome: Progressing   Problem: Skin Integrity: Goal: Risk for impaired skin integrity will decrease 02/14/2023 2345 by Beryle Flock, RN Outcome: Progressing 02/14/2023 2344 by Beryle Flock, RN Outcome: Progressing

## 2023-02-14 NOTE — Progress Notes (Addendum)
PROGRESS NOTE    Michelle [name]  KVQ:[id number] DOB: 03/09/1933 DOA: 02/06/2023 PCP: Shelva Majestic, MD   Brief Narrative:  Patient is an 89 year old female with past medical history of hypertension, diabetes type 2, hyperlipidemia, dementia, coronary disease, COPD with 55-pack-year smoking history presenting as a trauma after seizure while falling from a seated position on the toilet.  Seizure appears of lasted approximately 2 minutes, patient markedly confused postevent combative with EMS requiring Versed.  Patient ultimately intubated due to combative nature and concern for trauma.  PCCM and trauma teams consulted initially for admission.  Patient's acute metabolic encephalopathy continues to improve slowly, no further episodes of seizures at this time but no clear etiology for seizure provocation either.  Patient ultimately extubated 7/22, resumed on home Lamictal 25 twice daily.  Patient transition to general medical floor with improvement daily but quite slow.  Continues to be agitated difficult to orient, likely some underlying hospital delirium as well.  Occasionally requiring restraints and mittens to protect lines and telemetry.  Patient more awake alert today with family at bedside attempting to feed herself breakfast. Overnight 7/25 patient noted to transition to A-fib with transient RVR now back to sinus rhythm this morning. Morning of the 26th patient is markedly improved alert oriented to person place situation and time, only orienting question incorrect was year which son states would not be abnormal for her.  At this time patient remains medically stable for discharge, plan to transfer patient to inpatient rehab, insurance currently denied patient's request and has upheld the appeal. Staff working to discharge to SNF pending bed availability and insurance authorization.  Assessment & Plan:   Principal Problem:   Seizure (HCC) Active Problems:   Altered mental status   Scalp  laceration  Acute metabolic encephalopathy, secondary to presumed breakthrough seizure, resolving Complicated by underlying dementia, trauma and head laceration Complicated by sundowning/hospital delirium -Unclear etiology of breakthrough seizure, continue patient on home Lamictal per neurology -Trauma initially following, appears stable, continue wound care over scalp laceration -Patient remains seizure-free per EEG -Hold any further CNS depressing meds, follow clinically (continue to wean off oxycodone as possible in the setting of trauma) -Zyprexa as needed for agitation overnight, delirium precautions ongoing -Mental status markedly improved overnight on 28th ANO x 3, feeding herself in bedside chair speaking coherently with family at bedside -mental status waxing and waning over the past 48 hours likely sundowning at this point -Ambulatory dysfunction ongoing - fell from seated position 7/27 without incident/injury -no imaging necessary  Acute hypoxic resp failure requiring mechanical ventilation Rule out aspiration pneumonitis Acute on chronic dysphagia exacerbated by mental status changes COPD, questionably in exacerbation:  Continue on advair and spiriva patient's home medications 55-pack-year smoking history, quit smoking 2008 Lung nodule measure up to 2 cm Currently on room air at rest - ambulatory oxygen screen without hypoxia Acute events on 7/24 and 7/29 with questionable aspiration of solids, speech continues to follow, diet likely to be transitioned towards soft/puree pending further evaluation -breo and incruse ellipta; prn duoneb for wheezing -will need repeat CT in 3 months for lung nodule -defer to primary pulmonologist for his PCP   Acute transient episode of A-fib with RVR, likely provoked, resolved -Complicated by above, new stroke, hypoxia, agitation confusion sundowning place patient at high risk for event -Sinus rhythm on EKG overnight and again this morning,  Cardizem x 1 given -hold anticoagulation given transient event -May benefit from Holter monitor at discharge  Anemia, chronic likely anemia  of chronic disease versus blood loss Rule out acute bleed -Patient has chronic vaginal spotting and bleeding per son which has been evaluated previously by gynecology without need for further workup -Patient was reported to have "GI bleed" overnight but this is not documented anywhere, unclear what/how this diagnosis was made -Patient has scant blood over PureWick, hemoglobin stable essentially at her baseline -Follow occult stool card, discussed case with patient's family, they would not consent to an endoscopy or surgical procedure per her previous wishes but are interested if she is having occult bleed.  Acute metabolic acidosis Lactic acidosis, resolved -Resolved, continue to follow labs, bicarb trending up appropriately   Hypotension History of hypertension -Without shock or need for pressors -Hold sedating medications, hold amlodipine, carvedilol, enalapril, hydralazine, furosemide per home med rec -Blood pressure remains labile in the setting of agitation and sedation, follow closely, continues on telemetry  Past medical history, stable CAD Hyperlipidemia -hold asa and plavix -Hold antihypertensives as above due to hypotension   Multi-septated cystic ovarian neoplasm -will need to f/u outpt w/ GYN (will likely need hysterectomy)   T2DM, well-controlled -Well-controlled A1c 5.5 continue sliding scale insulin hypoglycemic protocol   Depression -Continue home sertraline, hold nortriptyline  DVT prophylaxis: SCDs Start: 02/06/23 0402 Code Status:   Code Status: Full Code Family Communication: Son at bedside  Status is: Inpatient  Dispo: The patient is from: Home              Anticipated d/c is to: CIR              Anticipated d/c date is: Imminent              Patient currently IS medically stable for discharge  Consultants:   PCCM, neurology, trauma  Procedures:  None  Antimicrobials:  None indicated  Subjective: Patient slid out of chair yesterday afternoon without injury - no events noted overnight.  Denies vomiting but admits to mild nausea, denies headache fever chills shortness of breath or chest pain  Objective: Vitals:   02/14/23 0430 02/14/23 0432 02/14/23 0750 02/14/23 1143  BP:   103/88 121/72  Pulse:  100 (!) 110 (!) 102  Resp:  (!) 22 17 17   Temp:   97.9 F (36.6 C) 98 F (36.7 C)  TempSrc:   Oral Oral  SpO2:  97% 94% 95%  Weight: 80.4 kg     Height:        Intake/Output Summary (Last 24 hours) at 02/14/2023 1503 Last data filed at 02/14/2023 1421 Gross per 24 hour  Intake 200 ml  Output --  Net 200 ml    Filed Weights   02/11/23 0500 02/13/23 0500 02/14/23 0430  Weight: 82.4 kg 83.7 kg 80.4 kg    Examination:  General:  Pleasantly sitting up in bedside chair eating breakfast on her own alert oriented x 3 HEENT: Head laceration, superficial over the right parietal area Neck:  Without mass or deformity.  Trachea is midline. Lungs:  Clear to auscultate bilaterally without rhonchi, wheeze, or rales. Heart:  Regular rate and rhythm.  Without murmurs, rubs, or gallops. Abdomen:  Soft, nontender, nondistended.  Without guarding or rebound. Extremities: Without cyanosis, clubbing, edema, or obvious deformity. Skin:  Warm and dry, no erythema.  Data Reviewed: I have personally reviewed following labs and imaging studies  CBC: Recent Labs  Lab 02/08/23 0043 02/09/23 0259 02/10/23 0604 02/14/23 0853  WBC 9.9 11.7* 8.8  --   HGB 9.8* 12.0 9.8* 10.3*  HCT  29.2* 36.1 30.9* 32.6*  MCV 85.9 88.3 90.1  --   PLT 242 238 241  --    Basic Metabolic Panel: Recent Labs  Lab 02/07/23 1746 02/08/23 0043 02/09/23 0259 02/10/23 0604  NA  --  140 138 140  K  --  3.5 3.6 3.8  CL  --  108 109 107  CO2  --  21* 19* 23  GLUCOSE  --  132* 126* 146*  BUN  --  14 18 17   CREATININE   --  0.83 0.94 0.83  CALCIUM  --  8.9 8.8* 8.6*  MG 2.2  --   --   --   PHOS 2.1*  --   --   --    GFR: Estimated Creatinine Clearance: 49.1 mL/min (by C-G formula based on SCr of 0.83 mg/dL).  Liver Function Tests: No results for input(s): "AST", "ALT", "ALKPHOS", "BILITOT", "PROT", "ALBUMIN" in the last 168 hours.   No results for input(s): "AMMONIA" in the last 168 hours.  Coagulation Profile: No results for input(s): "INR", "PROTIME" in the last 168 hours.  CBG: Recent Labs  Lab 02/13/23 1216 02/13/23 1649 02/13/23 2120 02/14/23 0616 02/14/23 1146  GLUCAP 195* 207* 197* 165* 204*   Lipid Profile: Recent Labs    02/13/23 0522  TRIG 243*   Sepsis Labs: No results for input(s): "PROCALCITON", "LATICACIDVEN" in the last 168 hours.   Recent Results (from the past 240 hour(s))  Blood culture (routine x 2)     Status: None   Collection Time: 02/06/23  2:35 AM   Specimen: BLOOD RIGHT HAND  Result Value Ref Range Status   Specimen Description BLOOD RIGHT HAND  Final   Special Requests   Final    BOTTLES DRAWN AEROBIC AND ANAEROBIC Blood Culture adequate volume   Culture   Final    NO GROWTH 5 DAYS Performed at [facility], [address] [place], [place] [address]    Report Status 02/11/2023 FINAL  Final  Blood culture (routine x 2)     Status: None   Collection Time: 02/06/23  2:42 AM   Specimen: BLOOD LEFT HAND  Result Value Ref Range Status   Specimen Description BLOOD LEFT HAND  Final   Special Requests   Final    BOTTLES DRAWN AEROBIC AND ANAEROBIC Blood Culture results may not be optimal due to an inadequate volume of blood received in culture bottles   Culture   Final    NO GROWTH 5 DAYS Performed at [facility], [address] [place], [place] [address]    Report Status 02/11/2023 FINAL  Final  MRSA Next Gen by PCR, Nasal     Status: None   Collection Time: 02/06/23  5:21 AM   Specimen: Nasal Mucosa; Nasal Swab  Result Value Ref Range  Status   MRSA by PCR Next Gen NOT DETECTED NOT DETECTED Final    Comment: (NOTE) The GeneXpert MRSA Assay (FDA approved for NASAL specimens only), is one component of a comprehensive MRSA colonization surveillance program. It is not intended to diagnose MRSA infection nor to guide or monitor treatment for MRSA infections. Test performance is not FDA approved in patients less than 64 years old. Performed at [facility], [address], [place], [place] [address]   Urine Culture (for pregnant, neutropenic or urologic patients or patients with an indwelling urinary catheter)     Status: None   Collection Time: 02/07/23  8:03 AM  Specimen: Urine, Clean Catch  Result Value Ref Range Status   Specimen Description URINE, CLEAN CATCH  Final   Special Requests NONE  Final   Culture   Final    NO GROWTH Performed at [facility], [address] [place], [place] [address]    Report Status 02/08/2023 FINAL  Final         Radiology Studies: No results found.      Scheduled Meds:  diclofenac Sodium  2 g Topical QID   feeding supplement  237 mL Oral BID BM   fluticasone furoate-vilanterol  1 puff Inhalation Daily   insulin aspart  0-20 Units Subcutaneous TID WC   insulin aspart  0-5 Units Subcutaneous QHS   lamoTRIgine  25 mg Oral BID   nystatin  5 mL Oral QID   mouth rinse  15 mL Mouth Rinse 4 times per day   pantoprazole  40 mg Oral Daily   rosuvastatin  40 mg Oral Daily   sertraline  100 mg Oral QHS   umeclidinium bromide  1 puff Inhalation Daily   Continuous Infusions:  sodium chloride       LOS: 8 days   Time spent:  Azucena Fallen, DO [facility]  If 7PM-7AM, please contact night-coverage [email or web address]  02/14/2023, 3:03 PM

## 2023-02-14 NOTE — Progress Notes (Signed)
Speech Language Pathology Treatment: Dysphagia  Patient Details Name: Michelle [name] MRN: [id number] DOB: 1933/03/17 Today's Date: 02/14/2023 Time: 4401-0272 SLP Time Calculation (min) (ACUTE ONLY): 28 min  Assessment / Plan / Recommendation Clinical Impression  Pt seen for dysphagia management.  Per notes - pt choked on sausage yesterday and RD reached out the SLP.  There was not a secure chat from RD to this SLP, - SLP  reached out to RD via secure chat to inquire means of communication re: pt's choking incident.     Per secure chat, RD reached out to a therapist who was not here later in the day - therefore advised to reach out to the entire group.   Thanks!   Pt with breakfast tray across her - but she was slid down in the bed therefore SLP placed her in trendelenburg to optimize position for po intake.   Prolonged mastication noted with eggs - without pt swallow despite verbal cue, therefore SLP provided her with banana that was mashed up to clear oral cavity.  Son inquired re: liquids to clear - but advised to concern for potential aspiration risk - and creamy pureed bolus or Ensure being safer consistency.   After adequate clearance, SLP then had pt help self feed =hand over hand assist= potato- again with prolonged mastication and lack of clearance - therefore SLP Instructed pt to expectorate, which she did.    Pt then wanted "coffee" and took two sips via straw.  Suspect potential aspiration event with sausage prior date is due to her prolonged "mastication" without swallowing.  Therefore established compensation strategy to mitigate this risk and demonstrated its usage to both sons.  Also set up oral suction and demonstrated use.      Pt became sleepy and was falling asleep with po in her mouth - therefore SLP ceased breakfast meal.  Provided pt's son with Ensure *chocolate* for pt to consume when fully alert and advised to maximize liquid nutrition for this pt.     Will continue to  follow briefly for tolerance - consider SLP treatment on trial basis at next venue of care -   HPI HPI: Michelle [name] is an 89 yo female who presented as trauma after having a seizure while utilizing the bedside toilet and hit her head. Head CT and Chest CT 7/22 negative for acute findings.  Pt with hx HTN, DM2, Hyperlipidemia, Dementia, CAD, COPD.  SLP follow up for dysphagia management indicated - OT reports family states pt having some coughing with intake and oral holding causing concern for her airway protection with po intake.  Pt's diet was advanced to dys3/thin and then she "choked" with sausage at breakfast am of 02/13/2023.  Follow up for dysphagia management indicated.      SLP Plan  Continue with current plan of care      Recommendations for follow up therapy are one component of a multi-disciplinary discharge planning process, led by the attending physician.  Recommendations may be updated based on patient status, additional functional criteria and insurance authorization.    Recommendations  Diet recommendations: Dysphagia 3 (mechanical soft);Thin liquid Liquids provided via: Cup;Straw Medication Administration: Whole meds with liquid (can take with Ensure) Supervision: Intermittent supervision to cue for compensatory strategies Compensations: Slow rate;Small sips/bites;Other (Comment) (oral suction if pt does not swallow) Postural Changes and/or Swallow Maneuvers: Seated upright 90 degrees                   (check for oral  retention)     Dysphagia, oral phase (R13.11)     Continue with current plan of care    [name], MS [facility] SLP Acute Rehab Services Office [phone]  [name]  02/14/2023, 10:33 AM

## 2023-02-14 NOTE — TOC Progression Note (Signed)
Transition of Care [facility]) - Progression Note    Patient Details  Name: AIANNA MERKLINGER MRN: [id number] Date of Birth: 1932/09/21  Transition of Care [facility]) CM/SW Contact  Baldemar Lenis, [place] Phone Number: 02/14/2023, 4:57 PM  Clinical Narrative:   CSW following for SNF placement. CSW attempted to contact [name] this morning, left a voicemail, awaiting call back. CSW later met with son [name] at bedside to discuss SNF options. Family is interested in [facility], referral is still pending. CSW asked [facility] to review. Family is hoping to go and tour places tomorrow, but [name] is still sick which is limiting availability. CSW to follow.    Expected Discharge Plan: Skilled Nursing Facility Barriers to Discharge: Continued Medical Work up, English as a second language teacher  Expected Discharge Plan and Services                                               Social Determinants of Health (SDOH) Interventions SDOH Screenings   Food Insecurity: No Food Insecurity (02/07/2023)  Housing: Patient Declined (02/07/2023)  Transportation Needs: No Transportation Needs (02/07/2023)  Utilities: Not At Risk (02/07/2023)  Depression (PHQ2-9): Medium Risk (08/19/2022)  Financial Resource Strain: Low Risk  (06/20/2022)  Physical Activity: Inactive (06/20/2022)  Social Connections: Socially Isolated (06/20/2022)  Stress: No Stress Concern Present (06/20/2022)  Tobacco Use: Medium Risk (02/07/2023)    Readmission Risk Interventions    09/01/2020   12:30 PM  Readmission Risk Prevention Plan  Transportation Screening Complete  PCP or Specialist Appt within 3-5 Days Complete  HRI or Home Care Consult Complete  Social Work Consult for Recovery Care Planning/Counseling Complete  Palliative Care Screening Not Applicable  Medication Review [employer]) Complete

## 2023-02-15 ENCOUNTER — Inpatient Hospital Stay (HOSPITAL_COMMUNITY): Payer: Medicare HMO

## 2023-02-15 DIAGNOSIS — Z515 Encounter for palliative care: Secondary | ICD-10-CM

## 2023-02-15 DIAGNOSIS — R4182 Altered mental status, unspecified: Secondary | ICD-10-CM | POA: Diagnosis not present

## 2023-02-15 DIAGNOSIS — S0101XA Laceration without foreign body of scalp, initial encounter: Secondary | ICD-10-CM

## 2023-02-15 DIAGNOSIS — Z7189 Other specified counseling: Secondary | ICD-10-CM

## 2023-02-15 DIAGNOSIS — R569 Unspecified convulsions: Secondary | ICD-10-CM | POA: Diagnosis not present

## 2023-02-15 LAB — GLUCOSE, CAPILLARY
Glucose-Capillary: 160 mg/dL — ABNORMAL HIGH (ref 70–99)
Glucose-Capillary: 256 mg/dL — ABNORMAL HIGH (ref 70–99)
Glucose-Capillary: 220 mg/dL — ABNORMAL HIGH (ref 70–99)
Glucose-Capillary: 157 mg/dL — ABNORMAL HIGH (ref 70–99)
Glucose-Capillary: 211 mg/dL — ABNORMAL HIGH (ref 70–99)

## 2023-02-15 LAB — URINALYSIS, ROUTINE W REFLEX MICROSCOPIC
Bilirubin Urine: NEGATIVE
Glucose, UA: NEGATIVE mg/dL
Hgb urine dipstick: NEGATIVE
Ketones, ur: NEGATIVE mg/dL
Leukocytes,Ua: NEGATIVE
Nitrite: NEGATIVE
Protein, ur: 30 mg/dL — AB
Specific Gravity, Urine: 1.017 (ref 1.005–1.030)
pH: 5 (ref 5.0–8.0)

## 2023-02-15 LAB — CBC
HCT: 32.7 % — ABNORMAL LOW (ref 36.0–46.0)
Hemoglobin: 10.2 g/dL — ABNORMAL LOW (ref 12.0–15.0)
MCH: 28.1 pg (ref 26.0–34.0)
MCHC: 31.2 g/dL (ref 30.0–36.0)
MCV: 90.1 fL (ref 80.0–100.0)
Platelets: 313 10*3/uL (ref 150–400)
RBC: 3.63 MIL/uL — ABNORMAL LOW (ref 3.87–5.11)
RDW: 13.6 % (ref 11.5–15.5)
WBC: 8.7 10*3/uL (ref 4.0–10.5)
nRBC: 0 % (ref 0.0–0.2)

## 2023-02-15 LAB — BASIC METABOLIC PANEL
Anion gap: 9 (ref 5–15)
BUN: 18 mg/dL (ref 8–23)
CO2: 24 mmol/L (ref 22–32)
Calcium: 8.9 mg/dL (ref 8.9–10.3)
Chloride: 106 mmol/L (ref 98–111)
Creatinine, Ser: 0.81 mg/dL (ref 0.44–1.00)
GFR, Estimated: >60 mL/min (ref >60)
Glucose, Bld: 98 mg/dL (ref 70–99)
Potassium: 4.3 mmol/L (ref 3.5–5.1)
Sodium: 139 mmol/L (ref 135–145)

## 2023-02-15 MED ORDER — ONDANSETRON HCL 4 MG/2ML IJ SOLN: 4.0000 mg | Freq: Four times a day (QID) | INTRAMUSCULAR | Status: DC | PRN

## 2023-02-15 MED ORDER — NORTRIPTYLINE HCL 10 MG PO CAPS: 10.0000 mg | ORAL_CAPSULE | Freq: Every evening | ORAL | Status: DC | PRN

## 2023-02-15 MED ORDER — CLOPIDOGREL BISULFATE 75 MG PO TABS
75.0000 mg | ORAL_TABLET | Freq: Every day | ORAL | Status: DC
  Administered 2023-02-15: 75 mg via ORAL
  Filled 2023-02-15: qty 1

## 2023-02-15 MED ORDER — FENOFIBRATE 54 MG PO TABS
54.0000 mg | ORAL_TABLET | Freq: Every day | ORAL | Status: DC
  Administered 2023-02-15 – 2023-02-18 (×4): 54 mg via ORAL
  Filled 2023-02-15 (×4): qty 1

## 2023-02-15 MED ORDER — HYDRALAZINE HCL 20 MG/ML IJ SOLN: 10.0000 mg | INTRAMUSCULAR | Status: DC | PRN

## 2023-02-15 MED ORDER — SPIRONOLACTONE 25 MG PO TABS: 25.0000 mg | ORAL_TABLET | Freq: Every day | ORAL | Status: DC

## 2023-02-15 MED ORDER — SODIUM CHLORIDE 0.9 % IV BOLUS
1000.0000 mL | Freq: Once | INTRAVENOUS | Status: AC
  Administered 2023-02-15: 1000 mL via INTRAVENOUS

## 2023-02-15 MED ORDER — ASPIRIN 81 MG PO CHEW
81.0000 mg | CHEWABLE_TABLET | Freq: Every day | ORAL | Status: DC
  Administered 2023-02-16 – 2023-02-18 (×3): 81 mg via ORAL
  Filled 2023-02-15 (×3): qty 1

## 2023-02-15 MED ORDER — FERROUS SULFATE 325 (65 FE) MG PO TABS
325.0000 mg | ORAL_TABLET | ORAL | Status: DC
  Administered 2023-02-16: 325 mg via ORAL
  Filled 2023-02-15: qty 1

## 2023-02-15 MED ORDER — AMLODIPINE BESYLATE 2.5 MG PO TABS
2.5000 mg | ORAL_TABLET | Freq: Every day | ORAL | Status: DC
  Administered 2023-02-15: 2.5 mg via ORAL
  Filled 2023-02-15: qty 1

## 2023-02-15 MED ORDER — FUROSEMIDE 10 MG/ML IJ SOLN
40.0000 mg | Freq: Once | INTRAMUSCULAR | Status: AC
  Administered 2023-02-15: 40 mg via INTRAVENOUS
  Filled 2023-02-15: qty 4

## 2023-02-15 MED ORDER — CLOPIDOGREL BISULFATE 75 MG PO TABS
75.0000 mg | ORAL_TABLET | Freq: Every day | ORAL | Status: DC
  Administered 2023-02-15 – 2023-02-18 (×4): 75 mg via ORAL
  Filled 2023-02-15 (×4): qty 1

## 2023-02-15 MED ORDER — METOPROLOL TARTRATE 5 MG/5ML IV SOLN: 5.0000 mg | INTRAVENOUS | Status: DC | PRN

## 2023-02-15 MED ORDER — MIRTAZAPINE 15 MG PO TABS
7.5000 mg | ORAL_TABLET | Freq: Every day | ORAL | Status: DC
  Administered 2023-02-15 – 2023-02-17 (×3): 7.5 mg via ORAL
  Filled 2023-02-15 (×3): qty 1

## 2023-02-15 MED ORDER — CARVEDILOL 12.5 MG PO TABS
12.5000 mg | ORAL_TABLET | Freq: Two times a day (BID) | ORAL | Status: DC
  Administered 2023-02-15: 12.5 mg via ORAL
  Filled 2023-02-15: qty 1

## 2023-02-15 MED ORDER — CHLORHEXIDINE GLUCONATE CLOTH 2 % EX PADS
6.0000 | MEDICATED_PAD | Freq: Every day | CUTANEOUS | Status: DC
  Administered 2023-02-15 – 2023-02-18 (×4): 6 via TOPICAL

## 2023-02-15 MED ORDER — IPRATROPIUM-ALBUTEROL 0.5-2.5 (3) MG/3ML IN SOLN
3.0000 mL | RESPIRATORY_TRACT | Status: DC | PRN
  Administered 2023-02-15 – 2023-02-18 (×5): 3 mL via RESPIRATORY_TRACT
  Filled 2023-02-15 (×5): qty 3

## 2023-02-15 MED ORDER — FOLIC ACID 1 MG PO TABS
1.0000 mg | ORAL_TABLET | Freq: Every day | ORAL | Status: DC
  Administered 2023-02-16 – 2023-02-18 (×3): 1 mg via ORAL
  Filled 2023-02-15 (×3): qty 1

## 2023-02-15 NOTE — Progress Notes (Signed)
OT Cancellation Note  Patient Details Name: Michelle [name] MRN: [id number] DOB: 1932/08/23   Cancelled Treatment:    Reason Eval/Treat Not Completed: Patient not medically ready (low BP currently and RN requesting therapy to hold)  Mateo Flow 02/15/2023, 12:23 PM

## 2023-02-15 NOTE — Progress Notes (Signed)
PT Cancellation Note  Patient Details Name: Michelle [name] MRN: [id number] DOB: February 01, 1933   Cancelled Treatment:    Reason Eval/Treat Not Completed: Medical issues which prohibited therapy. Discussed pt case with OT who was unable to work with pt 2 hypotension. Checked on pt >1 hour later and nursing reports BP still low and requests PT hold at this time. Will continue to follow.    Marylynn Pearson 02/15/2023, 2:17 PM  Conni Slipper, PT, DPT Acute Rehabilitation Services Secure Chat Preferred Office: [phone]

## 2023-02-15 NOTE — Progress Notes (Signed)
PROGRESS NOTE    Michelle [name]  WUJ:[id number] DOB: Jan 17, 1933 DOA: 02/06/2023 PCP: Shelva Majestic, MD   Brief Narrative:  89 year old female with past medical history of hypertension, diabetes type 2, hyperlipidemia, dementia, coronary disease, COPD with 55-pack-year smoking history presenting as a trauma after seizure while falling from a seated position on the toilet.  Seizure appears of lasted approximately 2 minutes, patient markedly confused postevent combative with EMS requiring Versed.  Patient ultimately intubated due to combative nature and concern for trauma.  PCCM and trauma teams consulted initially for admission.   Patient's acute metabolic encephalopathy continues to improve slowly, no further episodes of seizures at this time but no clear etiology for seizure provocation either.  Patient ultimately extubated 7/22, resumed on home Lamictal 25 twice daily.   Patient transition to general medical floor with improvement daily but quite slow.  Continues to be agitated difficult to orient, likely some underlying hospital delirium as well.  Occasionally requiring restraints and mittens to protect lines and telemetry.  Patient more awake alert today with family at bedside attempting to feed herself breakfast. Overnight 7/25 patient noted to transition to A-fib with transient RVR now back to sinus rhythm this morning. Morning of the 26th patient is markedly improved alert oriented to person place situation and time, only orienting question incorrect was year which son states would not be abnormal for her.   At this time patient remains medically stable for discharge, plan to transfer patient to inpatient rehab, insurance currently denied patient's request and has upheld the appeal. Staff working to discharge to SNF pending bed availability and insurance authorization.   Assessment & Plan:  Principal Problem:   Seizure (HCC) Active Problems:   Altered mental status   Scalp  laceration     Acute metabolic encephalopathy, secondary to presumed breakthrough seizure, resolving Complicated by underlying dementia, trauma and head laceration Complicated by sundowning/hospital delirium Mentation is still fluctuating this morning.  Seen by neurology.  Zyprexa overnight for agitation. Will repeat CT of the head Repeat stat labs  Acute urinary retention - Greater than 600 cc.  Foley catheter ordered   Acute hypoxic resp failure requiring mechanical ventilation Rule out aspiration pneumonitis Acute on chronic dysphagia exacerbated by mental status changes COPD, questionably in exacerbation:  Continue home bronchodilators.  Has 2 mm lung nodule which can be followed up outpatient.  Will need repeat CT chest in 3 months, this can be followed up by PCP/outpatient primary pulmonary.    Acute transient episode of A-fib with RVR, likely provoked, resolved Your EKG is normal sinus rhythm but possible episode of A-fib which appears to have resolved.  Benefit from outpatient Holter monitoring upon discharge   Anemia, chronic likely anemia of chronic disease versus blood loss Rule out acute bleed Possible some chronic vaginal spotting versus possible GI bleed.  Hemoglobin stable.  Family against any endoscopic or surgical procedure at this time therefore continue to monitor.    Acute metabolic acidosis Lactic acidosis, resolved -Resolved   Hypotension History of hypertension Discontinue Norvasc and Coreg.  IV fluid bolus.   Past medical history, stable CAD Hyperlipidemia Aspirin and Plavix    Multi-septated cystic ovarian neoplasm -will need to f/u outpt w/ GYN (will likely need hysterectomy)   T2DM, well-controlled -Well-controlled A1c 5.5 continue sliding scale insulin hypoglycemic protocol   Depression -Continue home sertraline, hold nortriptyline   DVT prophylaxis: SCDs Start: 02/06/23 0402 Code Status:   DNR paperwork at bedside Family Communication:  Son at  bedside   Status is: Inpatient   Dispo: The patient is from: Home              Anticipated d/c is to: CIR              Anticipated d/c date is: Imminent              Patient currently IS medically stable for discharge   Pressure Injury 02/07/23 Sacrum Mid Stage 2 -  Partial thickness loss of dermis presenting as a shallow open injury with a red, pink wound bed without slough. small circular area of broken skin without drainage (Active)  02/07/23 1500  Location: Sacrum  Location Orientation: Mid  Staging: Stage 2 -  Partial thickness loss of dermis presenting as a shallow open injury with a red, pink wound bed without slough.  Wound Description (Comments): small circular area of broken skin without drainage  Present on Admission: Yes     Diet Orders (From admission, onward)     Start     Ordered   02/14/23 1013  DIET DYS 3 Room service appropriate? No; Fluid consistency: Thin  Diet effective now       Comments: PUREED MEATS, EXTRA GRAVY/SAUCE  Question Answer Comment  Room service appropriate? No   Fluid consistency: Thin      02/14/23 1013            Subjective: Patient appears to be slightly agitated this morning answers basic question. Son also has paperwork at bedside which clearly states patient is DNR/DNI  Soon after my visit patient became hypotensive which responded to IV fluids.  Appears slightly uncomfortable but was not able to express exactly what she was feeling.  Bladder scan showed significant urinary retention therefore Foley catheter ordered.  Examination:  General exam: Appears calm and comfortable  Respiratory system: Clear to auscultation. Respiratory effort normal. Cardiovascular system: S1 & S2 heard, RRR. No JVD, murmurs, rubs, gallops or clicks. No pedal edema. Gastrointestinal system: Abdomen is nondistended, soft and nontender. No organomegaly or masses felt. Normal bowel sounds heard. Central nervous system: Alert and oriented. No  focal neurological deficits. Extremities: Symmetric 5 x 5 power. Skin: No rashes, lesions or ulcers Psychiatry: J poor meant and insight.  Objective: Vitals:   02/14/23 1707 02/14/23 1953 02/15/23 0343 02/15/23 0903  BP: 132/83 135/81 (!) 149/95 (!) 151/80  Pulse: (!) 108 (!) 108 (!) 108 (!) 103  Resp: 17 18 17 18   Temp: 97.7 F (36.5 C) 98.3 F (36.8 C) 98.2 F (36.8 C)   TempSrc: Oral Oral Oral Oral  SpO2: 99% 100% 99% 95%  Weight:      Height:        Intake/Output Summary (Last 24 hours) at 02/15/2023 0958 Last data filed at 02/14/2023 1732 Gross per 24 hour  Intake 218 ml  Output --  Net 218 ml   Filed Weights   02/11/23 0500 02/13/23 0500 02/14/23 0430  Weight: 82.4 kg 83.7 kg 80.4 kg    Scheduled Meds:  diclofenac Sodium  2 g Topical QID   feeding supplement  237 mL Oral BID BM   fluticasone furoate-vilanterol  1 puff Inhalation Daily   insulin aspart  0-20 Units Subcutaneous TID WC   insulin aspart  0-5 Units Subcutaneous QHS   lamoTRIgine  25 mg Oral BID   nystatin  5 mL Oral QID   mouth rinse  15 mL Mouth Rinse 4 times per day   pantoprazole  40 mg  Oral Daily   rosuvastatin  40 mg Oral Daily   sertraline  100 mg Oral QHS   umeclidinium bromide  1 puff Inhalation Daily   Continuous Infusions:  sodium chloride      Nutritional status Signs/Symptoms: meal completion < 50% Interventions: Magic cup, Ensure Enlive (each supplement provides 350kcal and 20 grams of protein) Body mass index is 29.5 kg/m.  Data Reviewed:   CBC: Recent Labs  Lab 02/09/23 0259 02/10/23 0604 02/14/23 0853  WBC 11.7* 8.8  --   HGB 12.0 9.8* 10.3*  HCT 36.1 30.9* 32.6*  MCV 88.3 90.1  --   PLT 238 241  --    Basic Metabolic Panel: Recent Labs  Lab 02/09/23 0259 02/10/23 0604  NA 138 140  K 3.6 3.8  CL 109 107  CO2 19* 23  GLUCOSE 126* 146*  BUN 18 17  CREATININE 0.94 0.83  CALCIUM 8.8* 8.6*   GFR: Estimated Creatinine Clearance: 49.1 mL/min (by C-G formula  based on SCr of 0.83 mg/dL). Liver Function Tests: No results for input(s): "AST", "ALT", "ALKPHOS", "BILITOT", "PROT", "ALBUMIN" in the last 168 hours. No results for input(s): "LIPASE", "AMYLASE" in the last 168 hours. No results for input(s): "AMMONIA" in the last 168 hours. Coagulation Profile: No results for input(s): "INR", "PROTIME" in the last 168 hours. Cardiac Enzymes: No results for input(s): "CKTOTAL", "CKMB", "CKMBINDEX", "TROPONINI" in the last 168 hours. BNP (last 3 results) No results for input(s): "PROBNP" in the last 8760 hours. HbA1C: No results for input(s): "HGBA1C" in the last 72 hours. CBG: Recent Labs  Lab 02/14/23 0616 02/14/23 1146 02/14/23 1635 02/14/23 2118 02/15/23 0612  GLUCAP 165* 204* 261* 162* 160*   Lipid Profile: Recent Labs    02/13/23 0522  TRIG 243*   Thyroid Function Tests: No results for input(s): "TSH", "T4TOTAL", "FREET4", "T3FREE", "THYROIDAB" in the last 72 hours. Anemia Panel: No results for input(s): "VITAMINB12", "FOLATE", "FERRITIN", "TIBC", "IRON", "RETICCTPCT" in the last 72 hours. Sepsis Labs: No results for input(s): "PROCALCITON", "LATICACIDVEN" in the last 168 hours.  Recent Results (from the past 240 hour(s))  Blood culture (routine x 2)     Status: None   Collection Time: 02/06/23  2:35 AM   Specimen: BLOOD RIGHT HAND  Result Value Ref Range Status   Specimen Description BLOOD RIGHT HAND  Final   Special Requests   Final    BOTTLES DRAWN AEROBIC AND ANAEROBIC Blood Culture adequate volume   Culture   Final    NO GROWTH 5 DAYS Performed at [facility], [address], [place], [place] [address]    Report Status 02/11/2023 FINAL  Final  Blood culture (routine x 2)     Status: None   Collection Time: 02/06/23  2:42 AM   Specimen: BLOOD LEFT HAND  Result Value Ref Range Status   Specimen Description BLOOD LEFT HAND  Final   Special Requests   Final    BOTTLES DRAWN AEROBIC AND ANAEROBIC Blood Culture  results may not be optimal due to an inadequate volume of blood received in culture bottles   Culture   Final    NO GROWTH 5 DAYS Performed at [facility], [address] [place], [place] [address]    Report Status 02/11/2023 FINAL  Final  MRSA Next Gen by PCR, Nasal     Status: None   Collection Time: 02/06/23  5:21 AM   Specimen: Nasal Mucosa; Nasal Swab  Result Value Ref Range Status  MRSA by PCR Next Gen NOT DETECTED NOT DETECTED Final    Comment: (NOTE) The GeneXpert MRSA Assay (FDA approved for NASAL specimens only), is one component of a comprehensive MRSA colonization surveillance program. It is not intended to diagnose MRSA infection nor to guide or monitor treatment for MRSA infections. Test performance is not FDA approved in patients less than 33 years old. Performed at [facility], [address] [place], [place] [address]   Urine Culture (for pregnant, neutropenic or urologic patients or patients with an indwelling urinary catheter)     Status: None   Collection Time: 02/07/23  8:03 AM   Specimen: Urine, Clean Catch  Result Value Ref Range Status   Specimen Description URINE, CLEAN CATCH  Final   Special Requests NONE  Final   Culture   Final    NO GROWTH Performed at [facility], [address] [place], [place] [address]    Report Status 02/08/2023 FINAL  Final         Radiology Studies: No results found.         LOS: 9 days   Time spent= 35 mins    Duffy Dantonio Joline Maxcy, MD [facility]  If 7PM-7AM, please contact night-coverage  02/15/2023, 9:58 AM

## 2023-02-15 NOTE — Consult Note (Addendum)
Palliative Care Consult Note                                  Date: 02/15/2023   Patient Name: Michelle [name]  DOB: 1932/08/16  MRN: [id number]  Age / Sex: 89 y.o., female  PCP: Shelva Majestic, MD Referring Physician: Dimple Nanas, MD  Reason for Consultation: Establishing goals of care  HPI/Patient Profile: 89 y.o. female  with past medical history of diabetes type 2, coronary artery disease, chronic diastolic heart failure, COPD, hypertension, hyperlipidemia, dementia, and history of seizure disorder who presented to the ED on 02/06/2023, after seizure activity while falling from seated to normal appointment.  Patient was confused and combative postevent, and was ultimately intubated for airway protection.  She was extubated later on the same day.  Hospitalization has been complicated by agitation in the setting of likely acute delirium as well as underlying cognitive impairment.  Palliative Medicine was consulted for goals of care.   Past Medical History:  Diagnosis Date   Anginal pain (HCC)    Arthritis    AV block, 1st degree    CAD (coronary artery disease) 1990; 2015   Cardiac cath 1990 with Dr. Riley Kill and [name] Residual CAD, non obstructive per 2015 cath. STEMI status post stent in August 2015.   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 She is awake and able to answer most questions appropriately. She reports shortness of breath.   I met with her sons [name] and [name] to discuss diagnosis, prognosis, GOC, disposition, and options.  Patient has been followed by outpatient palliative of [facility] since 2022. I re-introduced Palliative Medicine as specialized medical care for people living with serious illness. It focuses on providing relief from the symptoms and stress of a serious illness.   Created space and opportunity for patient and family to express thoughts and feelings regarding current medical situation. Values and goals of care were attempted to be elicited.  Life Review: Patient is originally from [place]. 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  [name] and [name] are her only children. She also has 2 granddaughters as well as 4 great-grandchildren.  She tells me "I've had an enjoyable life".  Functional Status: Patient lives in her own home in [place].  Her son [name] lives with her.  At baseline, she is ambulatory with a walker.  She is able to dress and feed herself.  She needs assistance with bathing at times.  She occasionally gets out of the house for family events or medical appointments.  Today's Discussion: We discussed patient's current medical situation and what it means in the larger context of her ongoing  co-morbidities. Current clinical status was reviewed.   We reviewed patient's chronic medical issues including COPD, chronic heart failure, coronary artery disease, diabetes and hypertension. Discussed the natural trajectory of chronic illness as non-curable, progressive, and ultimately life-limiting.   We also reviewed patient's hospital course, which began with a breakthrough seizure and has been complicated by ongoing altered mental status likely due to acute delirium  and underlying cognitive impairment.   Discussed that patients with advanced age and comorbid conditions have less reserve to recover from a serious illness. Discussed that patient may not return to her previous baseline, but will need to allow time for  potential recovery to occur.    Discussed that current recommendation from PT is  for SNF/rehab when medically stable. Patient and sons are hopeful she will improve enough to ultimately return home.  We discussed different paths of care with regard to scope of treatment - full scope versus limited interventions (treat the treatable) versus comfort care. At this time, sons would like to continue to treat the treatable and are hopeful for medical stabilization and improvement.   Confirmed code status as DNR with limited additional interventions. Sons agree they would not want patient to undergo intubation again, even if for airway protection.   Discussed the importance of continued conversation with patient, family, and the medical team regarding overall plan of care and treatment options.  **During our discussion, patient has worsening shortness of breath and increased work of breathing with audible wheezing. She has recently received a fluid bolus for hypotension. RT paged to the room to administer a breathing treatment. Noted to have rales with concern for pulmonary edema. Discussed with Dr. Nelson Chimes; lasix ordered.    Review of Systems  Constitutional:  Positive for appetite change.  Respiratory:  Positive for shortness of breath.     Objective:    Physical Exam Vitals reviewed.  Constitutional:      General: She is in acute distress.     Appearance: She is ill-appearing.  Pulmonary:     Breath sounds: Wheezing and rales present.  Neurological:     Mental Status: She is alert.     Comments: Oriented to person, place, situation  Psychiatric:        Cognition and Memory: Cognition is impaired. Memory is impaired.     Palliative  Assessment/Data: PPS 40%     Assessment & Plan:   SUMMARY OF RECOMMENDATIONS   Code status confirmed as DNR with limited additional interventions Continue current interventions - treat the treatable Goal of care - medical stabilization, rehab to improve functional status, and ultimately return home PMT will continue to follow  Primary Decision Maker: Patient with support of her 2 sons [name] is her documented health care agent, and [name] is alternate.  Advanced Directives: Patient has previously completed HCPOA and living will documents. Copy has been made to scan into EMR.   Symptom Management:  Start mirtazapine 7.5 mg at bedtime for sleep and appetite  Prognosis:  Unable to determine  Discharge Planning:  SNF for rehab    Thank you for allowing [place] to participate in the care of [place]  Time Total: 105 minutes  Greater than 50%  of this time was spent counseling and coordinating care related to the above assessment and plan.   Signed by: Sherlean Foot, NP Palliative Medicine Team  Team Phone # [phone]  For individual providers, please see AMION

## 2023-02-15 NOTE — TOC Progression Note (Signed)
Transition of Care [facility]) - Progression Note    Patient Details  Name: Michelle [name] MRN: [id number] Date of Birth: October 31, 1932  Transition of Care [facility]) CM/SW Contact  Baldemar [place], [place] Phone Number: 02/15/2023, 4:15 PM  Clinical Narrative:   CSW sent messages to [facility] to ask them to review referral, still pending at this time. CSW met with son, [name], at bedside to provide update. CSW to follow.    Expected Discharge Plan: Skilled Nursing Facility Barriers to Discharge: Continued Medical Work up, English as a second language teacher  Expected Discharge Plan and Services                                               Social Determinants of Health (SDOH) Interventions SDOH Screenings   Food Insecurity: No Food Insecurity (02/07/2023)  Housing: Patient Declined (02/07/2023)  Transportation Needs: No Transportation Needs (02/07/2023)  Utilities: Not At Risk (02/07/2023)  Depression (PHQ2-9): Medium Risk (08/19/2022)  Financial Resource Strain: Low Risk  (06/20/2022)  Physical Activity: Inactive (06/20/2022)  Social Connections: Socially Isolated (06/20/2022)  Stress: No Stress Concern Present (06/20/2022)  Tobacco Use: Medium Risk (02/07/2023)    Readmission Risk Interventions    09/01/2020   12:30 PM  Readmission Risk Prevention Plan  Transportation Screening Complete  PCP or Specialist Appt within 3-5 Days Complete  HRI or Home Care Consult Complete  Social Work Consult for Recovery Care Planning/Counseling Complete  Palliative Care Screening Not Applicable  Medication Review Oceanographer) Complete

## 2023-02-16 DIAGNOSIS — R569 Unspecified convulsions: Secondary | ICD-10-CM | POA: Diagnosis not present

## 2023-02-16 LAB — GLUCOSE, CAPILLARY
Glucose-Capillary: 152 mg/dL — ABNORMAL HIGH (ref 70–99)
Glucose-Capillary: 191 mg/dL — ABNORMAL HIGH (ref 70–99)
Glucose-Capillary: 339 mg/dL — ABNORMAL HIGH (ref 70–99)
Glucose-Capillary: 174 mg/dL — ABNORMAL HIGH (ref 70–99)

## 2023-02-16 LAB — BRAIN NATRIURETIC PEPTIDE: B Natriuretic Peptide: 1115.4 pg/mL — ABNORMAL HIGH (ref 0.0–100.0)

## 2023-02-16 MED ORDER — FUROSEMIDE 10 MG/ML IJ SOLN
20.0000 mg | Freq: Once | INTRAMUSCULAR | Status: DC
  Filled 2023-02-16: qty 4

## 2023-02-16 MED ORDER — DM-GUAIFENESIN ER 30-600 MG PO TB12
2.0000 | ORAL_TABLET | Freq: Two times a day (BID) | ORAL | Status: DC
  Administered 2023-02-16 – 2023-02-18 (×5): 2 via ORAL
  Filled 2023-02-16 (×7): qty 2

## 2023-02-16 MED ORDER — ARFORMOTEROL TARTRATE 15 MCG/2ML IN NEBU
15.0000 ug | INHALATION_SOLUTION | Freq: Two times a day (BID) | RESPIRATORY_TRACT | Status: DC
  Administered 2023-02-16 – 2023-02-17 (×4): 15 ug via RESPIRATORY_TRACT
  Filled 2023-02-16 (×5): qty 2

## 2023-02-16 MED ORDER — REVEFENACIN 175 MCG/3ML IN SOLN
175.0000 ug | Freq: Every day | RESPIRATORY_TRACT | Status: DC
  Administered 2023-02-16 – 2023-02-17 (×2): 175 ug via RESPIRATORY_TRACT
  Filled 2023-02-16 (×3): qty 3

## 2023-02-16 MED ORDER — FUROSEMIDE 10 MG/ML IJ SOLN
20.0000 mg | Freq: Once | INTRAMUSCULAR | Status: AC
  Administered 2023-02-16: 20 mg via INTRAVENOUS
  Filled 2023-02-16: qty 4

## 2023-02-16 NOTE — TOC Progression Note (Signed)
Transition of Care [facility]) - Progression Note    Patient Details  Name: [name] MRN: [id number] Date of Birth: 09-30-1932  Transition of Care [facility]) CM/SW Contact  Dellie Burns [place], [place] Phone Number: 02/16/2023, 2:06 PM  Clinical Narrative: denial received from [facility] with [facility]. Updated pt's son [name] who has accepted offer from [facility]. Notified Alvino Chapel at [facility] who confirmed they are prepared to admit pt pending auth and medical clearance. Will start auth closer to dc.   Dellie Burns, MSW, LCSW [phone] (coverage)        Expected Discharge Plan: Skilled Nursing Facility Barriers to Discharge: Continued Medical Work up, English as a second language teacher  Expected Discharge Plan and Services                                               Social Determinants of Health (SDOH) Interventions SDOH Screenings   Food Insecurity: No Food Insecurity (02/07/2023)  Housing: Patient Declined (02/07/2023)  Transportation Needs: No Transportation Needs (02/07/2023)  Utilities: Not At Risk (02/07/2023)  Depression (PHQ2-9): Medium Risk (08/19/2022)  Financial Resource Strain: Low Risk  (06/20/2022)  Physical Activity: Inactive (06/20/2022)  Social Connections: Socially Isolated (06/20/2022)  Stress: No Stress Concern Present (06/20/2022)  Tobacco Use: Medium Risk (02/07/2023)    Readmission Risk Interventions    09/01/2020   12:30 PM  Readmission Risk Prevention Plan  Transportation Screening Complete  PCP or Specialist Appt within 3-5 Days Complete  HRI or Home Care Consult Complete  Social Work Consult for Recovery Care Planning/Counseling Complete  Palliative Care Screening Not Applicable  Medication Review Oceanographer) Complete

## 2023-02-16 NOTE — Progress Notes (Signed)
Occupational Therapy Treatment Patient Details Name: Michelle [name] MRN: [id number] DOB: May 21, 1933 Today's Date: 02/16/2023   History of present illness 89 y.o. female who presents to [facility] on 02/06/2023 after having a seizure, falling and striking her head. Pt intubated in the ED, extubated same date. PMH includes HTN, seizures, DMII, HLD, dementia, CAD, COPD.   OT comments  Patient received in supine with legs slightly over edge of bed on right side. BP checked while supine with 114/80. Patient instructed on getting to EOB on left side with difficulty following directions requiring increased assistance to achieve. While on EOB patient's BP was 127/55. Patient performed standing from EOB to BUE platform walker with mod assist of 2 and able to take side steps towards HOB. Patient able to perform grooming tasks seated on EOB and required assistance to return to supine. Patient will benefit from continued inpatient follow up therapy, <3 hours/day to further address self care, functional transfers, and bed mobility. Acute OT to continue to follow.    Recommendations for follow up therapy are one component of a multi-disciplinary discharge planning process, led by the attending physician.  Recommendations may be updated based on patient status, additional functional criteria and insurance authorization.    Assistance Recommended at Discharge Intermittent Supervision/Assistance  Patient can return home with the following  A lot of help with walking and/or transfers;A lot of help with bathing/dressing/bathroom;Two people to help with walking and/or transfers   Equipment Recommendations  Wheelchair (measurements OT);Wheelchair cushion (measurements OT)    Recommendations for Other Services      Precautions / Restrictions Precautions Precautions: Fall Precaution Comments: seizure Restrictions Weight Bearing Restrictions: No       Mobility Bed Mobility Overal bed mobility: Needs  Assistance Bed Mobility: Supine to Sit, Sit to Supine     Supine to sit: Mod assist, Max assist, HOB elevated Sit to supine: Mod assist, +2 for safety/equipment   General bed mobility comments: assistance to exit bed to left with difficulty following commands to go to the left. Assistance with trunk and BLEs to return to supine    Transfers Overall transfer level: Needs assistance Equipment used: Right platform walker, Left platform walker Transfers: Sit to/from Stand Sit to Stand: Mod assist, +2 physical assistance           General transfer comment: patient required assistance of 2 to stand to BUE platform walker. Patient able to take side steps towards [facility] with assistance for balance and walker management     Balance Overall balance assessment: Needs assistance Sitting-balance support: Bilateral upper extremity supported, Feet supported Sitting balance-Leahy Scale: Fair Sitting balance - Comments: min guard for sitting balance Postural control: Posterior lean Standing balance support: Bilateral upper extremity supported, During functional activity, Reliant on assistive device for balance Standing balance-Leahy Scale: Poor Standing balance comment: reliant on RW for standing balance                           ADL either performed or assessed with clinical judgement   ADL Overall ADL's : Needs assistance/impaired     Grooming: Wash/dry face;Sitting;Minimal assistance;Brushing hair Grooming Details (indicate cue type and reason): on EOB assistance with combing hair     Lower Body Bathing: Maximal assistance;Sit to/from stand Lower Body Bathing Details (indicate cue type and reason): stood from EOB to BUE platform walker for peri area bathing     Lower Body Dressing: Total assistance Lower Body Dressing Details (indicate  cue type and reason): to donn socks                    Extremity/Trunk Assessment              Vision       Perception      Praxis      Cognition Arousal/Alertness: Awake/alert Behavior During Therapy: WFL for tasks assessed/performed Overall Cognitive Status: Impaired/Different from baseline Area of Impairment: Orientation, Attention, Memory, Following commands, Safety/judgement, Awareness, Problem solving                 Orientation Level: Disoriented to, Situation Current Attention Level: Sustained Memory: Decreased recall of precautions, Decreased short-term memory Following Commands: Follows multi-step commands inconsistently, Follows multi-step commands with increased time Safety/Judgement: Decreased awareness of safety, Decreased awareness of deficits Awareness: Intellectual Problem Solving: Slow processing General Comments: stated she was going to be late for work, cues for safety, increased time to follow commands        Exercises      Shoulder Instructions       General Comments BP in supine 114/80, seated on EOB 127/55, in supine at end of session 119/55    Pertinent Vitals/ Pain       Pain Assessment Pain Assessment: Faces Faces Pain Scale: Hurts a little bit Pain Location: bil knees Pain Descriptors / Indicators: Discomfort, Grimacing Pain Intervention(s): Monitored during session, Repositioned  Home Living                                          Prior Functioning/Environment              Frequency  Min 1X/week        Progress Toward Goals  OT Goals(current goals can now be found in the care plan section)  Progress towards OT goals: Progressing toward goals  Acute Rehab OT Goals Patient Stated Goal: none stated OT Goal Formulation: With patient Time For Goal Achievement: 02/23/23 Potential to Achieve Goals: Good ADL Goals Pt Will Perform Eating: with modified independence;sitting;with min assist Pt Will Perform Grooming: sitting;with min assist Pt Will Perform Upper Body Bathing: with min assist;sitting Pt Will Transfer to  Toilet: with mod assist;stand pivot transfer;bedside commode Additional ADL Goal #1: pt will complete bed mobility min (A) as precursor to adls.  Plan Discharge plan remains appropriate    Co-evaluation    PT/OT/SLP Co-Evaluation/Treatment: Yes Reason for Co-Treatment: For patient/therapist safety;To address functional/ADL transfers   OT goals addressed during session: ADL's and self-care      AM-PAC OT "6 Clicks" Daily Activity     Outcome Measure   Help from another person eating meals?: A Little Help from another person taking care of personal grooming?: A Little Help from another person toileting, which includes using toliet, bedpan, or urinal?: A Little Help from another person bathing (including washing, rinsing, drying)?: A Lot Help from another person to put on and taking off regular upper body clothing?: A Lot Help from another person to put on and taking off regular lower body clothing?: Total 6 Click Score: 14    End of Session Equipment Utilized During Treatment: Gait belt;Other (comment) (BUE platform walker)  OT Visit Diagnosis: Unsteadiness on feet (R26.81);Repeated falls (R29.6);Muscle weakness (generalized) (M62.81)   Activity Tolerance Patient tolerated treatment well   Patient Left in bed;with call bell/phone within reach;with bed alarm  set   Nurse Communication Mobility status;Precautions        Time: 8295-6213 OT Time Calculation (min): 23 min  Charges: OT General Charges $OT Visit: 1 Visit OT Treatments $Self Care/Home Management : 8-22 mins  Alfonse Flavors, OTA Acute Rehabilitation Services  Office [phone]   Dewain Penning 02/16/2023, 11:47 AM

## 2023-02-16 NOTE — Progress Notes (Signed)
Palliative Note  Met with sons [name] and [name], and grand-daughter, to review diagnosis, prognosis, GOC, disposition vs comfort care approach. Answered questions regarding Hospice philosophy vs outpatient Palliative Care.  Was able to review information from J. McIlquham NP's visit with family on 7/31.   At this time, family is still desiring discharge to SNF with hopes of patient able to return to her home. Will check in with family on 8/2.  Fermin Yan Ed.S, RN Palliative Medicine Team  Team Phone: [phone]

## 2023-02-16 NOTE — Progress Notes (Signed)
Physical Therapy Treatment  Patient Details Name: Michelle [name] MRN: [id number] DOB: [birth year] Today's Date: 02/16/2023   History of Present Illness 89 y.o. female who presents to [facility] on 02/06/2023 after having a seizure, falling and striking her head. Pt intubated in the ED, extubated same date. PMH includes HTN, seizures, DMII, HLD, dementia, CAD, COPD.    PT Comments  Pt progressing slowly towards physical therapy goals. Noted BP stable throughout session (see below), but pt lethargic and not motivated to participate. Pt answering questions appropriately <25% of the time, states she needs to get up and go to work, unaware she in the hospital. For optimal safety, pt returned to bed at end of session. Utilized upright rollator to encourage good posture. Continue to recommend follow up therapies <3 hours/day to maximize functional independence, safety, and decrease burden of care upon return home with family support.    If plan is discharge home, recommend the following: Two people to help with walking and/or transfers;Assistance with cooking/housework;Two people to help with bathing/dressing/bathroom;Direct supervision/assist for medications management;Direct supervision/assist for financial management;Assist for transportation;Help with stairs or ramp for entrance   Can travel by private vehicle        Equipment Recommendations  Other (comment) (TBD by next venue of care)    Recommendations for Other Services Rehab consult     Precautions / Restrictions Precautions Precautions: Fall Precaution Comments: seizure Restrictions Weight Bearing Restrictions: No     Mobility  Bed Mobility Overal bed mobility: Needs Assistance Bed Mobility: Supine to Sit, Sit to Supine     Supine to sit: Mod assist, HOB elevated, Max assist Sit to supine: Mod assist, +2 for safety/equipment   General bed mobility comments: assistance to exit bed to left with difficulty following commands to  go to the left (attepmting to climb out of the bed on the R upon entry). Assistance with trunk and BLEs to return to supine    Transfers Overall transfer level: Needs assistance Equipment used: Rollator (4 wheels) (Upright) Transfers: Sit to/from Stand Sit to Stand: Mod assist, +2 physical assistance           General transfer comment: Utilized upright rollator for sit<>stand. Pt with difficulty maintaining upright posture but able to hold improved posture with cues and assist. Attempted SPT to chair but pt twisting body prematurely and blocking walker from being able to be squared up with her. Transfer to chair deferred for safety.    Ambulation/Gait             Pre-gait activities: Pt took side steps up towards HOB (to the L),     Stairs             Wheelchair Mobility     Tilt Bed    Modified Rankin (Stroke Patients Only)       Balance Overall balance assessment: Needs assistance Sitting-balance support: Bilateral upper extremity supported, Feet supported Sitting balance-Leahy Scale: Fair Sitting balance - Comments: min guard for sitting balance Postural control: Posterior lean Standing balance support: Bilateral upper extremity supported, During functional activity, Reliant on assistive device for balance Standing balance-Leahy Scale: Poor Standing balance comment: reliant on RW for standing balance                            Cognition Arousal/Alertness: Awake/alert Behavior During Therapy: WFL for tasks assessed/performed Overall Cognitive Status: Impaired/Different from baseline Area of Impairment: Orientation, Attention, Memory, Following commands, Safety/judgement, Awareness, Problem  solving                 Orientation Level: Disoriented to, Situation Current Attention Level: Sustained Memory: Decreased recall of precautions, Decreased short-term memory Following Commands: Follows multi-step commands inconsistently, Follows  multi-step commands with increased time Safety/Judgement: Decreased awareness of safety, Decreased awareness of deficits Awareness: Intellectual Problem Solving: Slow processing General Comments: Pt states she needs to get out of here to go to work. Easily redirected but impulsive with trying to climb out of the bed.        Exercises      General Comments General comments (skin integrity, edema, etc.): BP in supine 114/80, seated on EOB 127/55, in supine at end of session 119/55      Pertinent Vitals/Pain Pain Assessment Pain Assessment: Faces Faces Pain Scale: Hurts a little bit Pain Location: bil knees Pain Descriptors / Indicators: Discomfort, Grimacing    Home Living                          Prior Function            PT Goals (current goals can now be found in the care plan section) Acute Rehab PT Goals Patient Stated Goal: did not state PT Goal Formulation: With patient/family Time For Goal Achievement: 02/20/23 Potential to Achieve Goals: Good Progress towards PT goals: Progressing toward goals    Frequency    Min 1X/week      PT Plan Current plan remains appropriate    Co-evaluation PT/OT/SLP Co-Evaluation/Treatment: Yes Reason for Co-Treatment: For patient/therapist safety;To address functional/ADL transfers PT goals addressed during session: Mobility/safety with mobility;Balance;Proper use of DME        AM-PAC PT "6 Clicks" Mobility   Outcome Measure  Help needed turning from your back to your side while in a flat bed without using bedrails?: A Lot Help needed moving from lying on your back to sitting on the side of a flat bed without using bedrails?: A Lot Help needed moving to and from a bed to a chair (including a wheelchair)?: Total Help needed standing up from a chair using your arms (e.g., wheelchair or bedside chair)?: Total Help needed to walk in hospital room?: Total Help needed climbing 3-5 steps with a railing? : Total 6  Click Score: 8    End of Session Equipment Utilized During Treatment: Gait belt Activity Tolerance: Patient tolerated treatment well Patient left: with call bell/phone within reach;in bed;with bed alarm set Nurse Communication: Mobility status PT Visit Diagnosis: Other abnormalities of gait and mobility (R26.89);Muscle weakness (generalized) (M62.81);Other symptoms and signs involving the nervous system (R29.898)     Time: 4259-5638 PT Time Calculation (min) (ACUTE ONLY): 24 min  Charges:    $Therapeutic Activity: 8-22 mins PT General Charges $$ ACUTE PT VISIT: 1 Visit                     Conni Slipper, PT, DPT Acute Rehabilitation Services Secure Chat Preferred Office: [phone]    Marylynn Pearson 02/16/2023, 5:16 PM

## 2023-02-16 NOTE — Progress Notes (Signed)
PROGRESS NOTE    Michelle [name]  ZOX:[id number] DOB: 08/16/1932 DOA: 02/06/2023 PCP: Shelva Majestic, MD   Brief Narrative:  89 year old female with past medical history of hypertension, diabetes type 2, hyperlipidemia, dementia, coronary disease, COPD with 55-pack-year smoking history presenting as a trauma after seizure while falling from a seated position on the toilet.  Seizure appears of lasted approximately 2 minutes, patient markedly confused postevent combative with EMS requiring Versed.  Patient ultimately intubated due to combative nature and concern for trauma.  PCCM and trauma teams consulted initially for admission.   Patient's acute metabolic encephalopathy continues to improve slowly, no further episodes of seizures at this time but no clear etiology for seizure provocation either.  Patient ultimately extubated 7/22, resumed on home Lamictal 25 twice daily.   Patient transition to general medical floor with improvement daily but quite slow.  Continues to be agitated difficult to orient, likely some underlying hospital delirium as well.  Occasionally requiring restraints and mittens to protect lines and telemetry.  Patient more awake alert today with family at bedside attempting to feed herself breakfast. Overnight 7/25 patient noted to transition to A-fib with transient RVR now back to sinus rhythm this morning. Morning of the 26th patient is markedly improved alert oriented to person place situation and time, only orienting question incorrect was year which son states would not be abnormal for her.   At this time patient remains medically stable for discharge, plan to transfer patient to inpatient rehab, insurance currently denied patient's request and has upheld the appeal. Staff working to discharge to SNF pending bed availability and insurance authorization.   Assessment & Plan:  Principal Problem:   Seizure (HCC) Active Problems:   Altered mental status   Scalp  laceration     Acute metabolic encephalopathy, secondary to presumed breakthrough seizure, resolving Complicated by underlying dementia, trauma and head laceration Complicated by sundowning/hospital delirium Mentation is better this morning.  CT head negative.  Repeat labs overall stable for now.  Acute urinary retention - Improved after placing Foley catheter on 7/31.   Acute hypoxic resp failure requiring mechanical ventilation Rule out aspiration pneumonitis Acute on chronic dysphagia exacerbated by mental status changes COPD, questionably in exacerbation:  Continue home bronchodilators.  Change inhalation medication to long-acting nebulizer Mucinex twice daily, flutter valve  Has 2 mm lung nodule which can be followed up outpatient.  Will need repeat CT chest in 3 months, this can be followed up by PCP/outpatient primary pulmonary.  Volume overload, elevated BNP with pulmonary edema - Another dose of IV Lasix 20 mg once today   Acute transient episode of A-fib with RVR, likely provoked, resolved Your EKG is normal sinus rhythm but possible episode of A-fib which appears to have resolved.  Benefit from outpatient Holter monitoring upon discharge   Anemia, chronic likely anemia of chronic disease versus blood loss Rule out acute bleed Possible some chronic vaginal spotting versus possible GI bleed.  Hemoglobin stable.  Family against any endoscopic or surgical procedure at this time therefore continue to monitor.    Acute metabolic acidosis Lactic acidosis, resolved -Resolved   Hypotension History of hypertension Discontinue Norvasc and Coreg.  IV fluid bolus.   Past medical history, stable CAD Hyperlipidemia Aspirin and Plavix    Multi-septated cystic ovarian neoplasm -will need to f/u outpt w/ GYN (will likely need hysterectomy)   T2DM, well-controlled -Well-controlled A1c 5.5 continue sliding scale insulin hypoglycemic protocol   Depression -Continue home  sertraline, hold nortriptyline  We should attempt out of bed to chair for   DVT prophylaxis: SCDs Start: 02/06/23 0402 Code Status:   DNR paperwork at bedside Family Communication: Son at bedside   Status is: Inpatient   Dispo: The patient is from: Home              Anticipated d/c is to: CIR              Anticipated d/c date is: Imminent              Patient currently IS medically stable for discharge   Pressure Injury 02/07/23 Sacrum Mid Stage 2 -  Partial thickness loss of dermis presenting as a shallow open injury with a red, pink wound bed without slough. small circular area of broken skin without drainage (Active)  02/07/23 1500  Location: Sacrum  Location Orientation: Mid  Staging: Stage 2 -  Partial thickness loss of dermis presenting as a shallow open injury with a red, pink wound bed without slough.  Wound Description (Comments): small circular area of broken skin without drainage  Present on Admission: Yes     Diet Orders (From admission, onward)     Start     Ordered   02/14/23 1013  DIET DYS 3 Room service appropriate? No; Fluid consistency: Thin  Diet effective now       Comments: PUREED MEATS, EXTRA GRAVY/SAUCE  Question Answer Comment  Room service appropriate? No   Fluid consistency: Thin      02/14/23 1013            Subjective: Overall feels okay but having significant coughing with abnormal rhonchorous breath sounds.  Examination:  General exam: Appears calm and comfortable  Respiratory system: Bilateral hide Cardiovascular system: S1 & S2 heard, RRR. No JVD, murmurs, rubs, gallops or clicks. No pedal edema. Gastrointestinal system: Abdomen is nondistended, soft and nontender. No organomegaly or masses felt. Normal bowel sounds heard. Central nervous system: Alert and oriented. No focal neurological deficits. Extremities: Symmetric 4 x 5 power. Skin: No rashes, lesions or ulcers Psychiatry: Poor judgment and insight  Objective: Vitals:    02/16/23 0440 02/16/23 0819 02/16/23 0840 02/16/23 1101  BP:      Pulse:      Resp:      Temp:      TempSrc:      SpO2:  96% 96% 96%  Weight: 81.6 kg     Height:        Intake/Output Summary (Last 24 hours) at 02/16/2023 1133 Last data filed at 02/16/2023 0114 Gross per 24 hour  Intake 236 ml  Output 1600 ml  Net -1364 ml   Filed Weights   02/13/23 0500 02/14/23 0430 02/16/23 0440  Weight: 83.7 kg 80.4 kg 81.6 kg    Scheduled Meds:  arformoterol  15 mcg Nebulization BID   aspirin  81 mg Oral Q breakfast   Chlorhexidine Gluconate Cloth  6 each Topical Daily   clopidogrel  75 mg Oral Daily   dextromethorphan-guaiFENesin  2 tablet Oral BID   diclofenac Sodium  2 g Topical QID   feeding supplement  237 mL Oral BID BM   fenofibrate  54 mg Oral Daily   ferrous sulfate  325 mg Oral Once per day on Monday Thursday   folic acid  1 mg Oral Q breakfast   insulin aspart  0-20 Units Subcutaneous TID WC   insulin aspart  0-5 Units Subcutaneous QHS   lamoTRIgine  25 mg Oral BID  mirtazapine  7.5 mg Oral QHS   mouth rinse  15 mL Mouth Rinse 4 times per day   pantoprazole  40 mg Oral Daily   revefenacin  175 mcg Nebulization Daily   rosuvastatin  40 mg Oral Daily   sertraline  100 mg Oral QHS   Continuous Infusions:  sodium chloride      Nutritional status Signs/Symptoms: meal completion < 50% Interventions: Magic cup, Ensure Enlive (each supplement provides 350kcal and 20 grams of protein) Body mass index is 29.94 kg/m.  Data Reviewed:   CBC: Recent Labs  Lab 02/10/23 0604 02/14/23 0853 02/15/23 1117  WBC 8.8  --  8.7  HGB 9.8* 10.3* 10.2*  HCT 30.9* 32.6* 32.7*  MCV 90.1  --  90.1  PLT 241  --  313   Basic Metabolic Panel: Recent Labs  Lab 02/10/23 0604 02/15/23 1531 02/16/23 0820  NA 140 139 137  K 3.8 4.3 4.2  CL 107 106 100  CO2 23 24 27   GLUCOSE 146* 98 145*  BUN 17 18 18   CREATININE 0.83 0.81 0.89  CALCIUM 8.6* 8.9 9.1  MG  --   --  1.8    GFR: Estimated Creatinine Clearance: 46.1 mL/min (by C-G formula based on SCr of 0.89 mg/dL). Liver Function Tests: No results for input(s): "AST", "ALT", "ALKPHOS", "BILITOT", "PROT", "ALBUMIN" in the last 168 hours. No results for input(s): "LIPASE", "AMYLASE" in the last 168 hours. No results for input(s): "AMMONIA" in the last 168 hours. Coagulation Profile: No results for input(s): "INR", "PROTIME" in the last 168 hours. Cardiac Enzymes: No results for input(s): "CKTOTAL", "CKMB", "CKMBINDEX", "TROPONINI" in the last 168 hours. BNP (last 3 results) No results for input(s): "PROBNP" in the last 8760 hours. HbA1C: No results for input(s): "HGBA1C" in the last 72 hours. CBG: Recent Labs  Lab 02/15/23 1153 02/15/23 1251 02/15/23 1624 02/15/23 2103 02/16/23 0629  GLUCAP 256* 220* 157* 211* 152*   Lipid Profile: Recent Labs    02/16/23 0820  TRIG 290*   Thyroid Function Tests: No results for input(s): "TSH", "T4TOTAL", "FREET4", "T3FREE", "THYROIDAB" in the last 72 hours. Anemia Panel: No results for input(s): "VITAMINB12", "FOLATE", "FERRITIN", "TIBC", "IRON", "RETICCTPCT" in the last 72 hours. Sepsis Labs: No results for input(s): "PROCALCITON", "LATICACIDVEN" in the last 168 hours.  Recent Results (from the past 240 hour(s))  Urine Culture (for pregnant, neutropenic or urologic patients or patients with an indwelling urinary catheter)     Status: None   Collection Time: 02/07/23  8:03 AM   Specimen: Urine, Clean Catch  Result Value Ref Range Status   Specimen Description URINE, CLEAN CATCH  Final   Special Requests NONE  Final   Culture   Final    NO GROWTH Performed at [facility], [address], [place], [place] [address]    Report Status 02/08/2023 FINAL  Final         Radiology Studies: DG Chest Port 1 View  Result Date: 02/15/2023 CLINICAL DATA:  Shortness of breath EXAM: PORTABLE CHEST 1 VIEW COMPARISON:  02/08/2023.  02/06/2023.   01/14/2021. FINDINGS: Heart size upper limits of normal. Chronic aortic atherosclerosis. Persistent interstitial lung density with basilar predominance. This could represent mild edema, mild pneumonia or possibly developing chronic lung disease. No dense consolidation or lobar collapse. No visible effusion. IMPRESSION: Persistent interstitial lung density with basilar predominance. This could represent mild edema, mild pneumonia or possibly developing chronic lung disease. Electronically Signed   By: Loraine Leriche  Shogry M.D.   On: 02/15/2023 16:21           LOS: 10 days   Time spent= 35 mins    Lynnox Girten Joline Maxcy, MD [facility]  If 7PM-7AM, please contact night-coverage  02/16/2023, 11:33 AM

## 2023-02-16 NOTE — Plan of Care (Signed)
  Problem: Education: Goal: Ability to describe self-care measures that may prevent or decrease complications (Diabetes Survival Skills Education) will improve Outcome: Progressing Goal: Individualized Educational Video(s) Outcome: Progressing   

## 2023-02-16 NOTE — Plan of Care (Signed)
  Problem: Education: Goal: Ability to describe self-care measures that may prevent or decrease complications (Diabetes Survival Skills Education) will improve Outcome: Progressing   Problem: Health Behavior/Discharge Planning: Goal: Ability to identify and utilize available resources and services will improve Outcome: Progressing   Problem: Nutritional: Goal: Maintenance of adequate nutrition will improve Outcome: Progressing   Problem: Skin Integrity: Goal: Risk for impaired skin integrity will decrease Outcome: Progressing

## 2023-02-17 ENCOUNTER — Inpatient Hospital Stay (HOSPITAL_COMMUNITY): Payer: Medicare HMO

## 2023-02-17 DIAGNOSIS — I428 Other cardiomyopathies: Secondary | ICD-10-CM

## 2023-02-17 DIAGNOSIS — R569 Unspecified convulsions: Secondary | ICD-10-CM | POA: Diagnosis not present

## 2023-02-17 LAB — ECHOCARDIOGRAM COMPLETE
Area-P 1/2: 4.52 cm2
Height: 65 in
S' Lateral: 3.4 cm
Weight: 2927.71 oz

## 2023-02-17 LAB — GLUCOSE, CAPILLARY
Glucose-Capillary: 168 mg/dL — ABNORMAL HIGH (ref 70–99)
Glucose-Capillary: 190 mg/dL — ABNORMAL HIGH (ref 70–99)
Glucose-Capillary: 166 mg/dL — ABNORMAL HIGH (ref 70–99)
Glucose-Capillary: 141 mg/dL — ABNORMAL HIGH (ref 70–99)

## 2023-02-17 MED ORDER — FUROSEMIDE 10 MG/ML IJ SOLN
40.0000 mg | Freq: Once | INTRAMUSCULAR | Status: AC
  Administered 2023-02-17: 40 mg via INTRAVENOUS
  Filled 2023-02-17: qty 4

## 2023-02-17 MED ORDER — MAGNESIUM SULFATE 2 GM/50ML IV SOLN
2.0000 g | Freq: Once | INTRAVENOUS | Status: AC
  Administered 2023-02-17: 2 g via INTRAVENOUS
  Filled 2023-02-17: qty 50

## 2023-02-17 NOTE — Progress Notes (Signed)
PROGRESS NOTE    Michelle [name]  YQM:[id number] DOB: 1933/06/09 DOA: 02/06/2023 PCP: Shelva Majestic, MD   Brief Narrative:  89 year old female with past medical history of hypertension, diabetes type 2, hyperlipidemia, dementia, coronary disease, COPD with 55-pack-year smoking history presenting as a trauma after seizure while falling from a seated position on the toilet.  Seizure appears of lasted approximately 2 minutes, patient markedly confused postevent combative with EMS requiring Versed.  Patient ultimately intubated due to combative nature and concern for trauma.  PCCM and trauma teams consulted initially for admission.   Patient's acute metabolic encephalopathy continues to improve slowly, no further episodes of seizures at this time but no clear etiology for seizure provocation either.  Patient ultimately extubated 7/22, resumed on home Lamictal 25 twice daily.   Patient transition to general medical floor with improvement daily but quite slow.  Continues to be agitated difficult to orient, likely some underlying hospital delirium as well.  Occasionally requiring restraints and mittens to protect lines and telemetry.  Patient more awake alert today with family at bedside attempting to feed herself breakfast. Overnight 7/25 patient noted to transition to A-fib with transient RVR now back to sinus rhythm this morning. Morning of the 26th patient is markedly improved alert oriented to person place situation and time, only orienting question incorrect was year which son states would not be abnormal for her.   At this time patient remains medically stable for discharge, plan to transfer patient to inpatient rehab, insurance currently denied patient's request and has upheld the appeal. Staff working to discharge to SNF pending bed availability and insurance authorization.   Assessment & Plan:  Principal Problem:   Seizure (HCC) Active Problems:   Altered mental status   Scalp  laceration     Acute metabolic encephalopathy, secondary to presumed breakthrough seizure, resolving Complicated by underlying dementia, trauma and head laceration Complicated by sundowning/hospital delirium Mental is stable.  CT head negative  Acute urinary retention - Improved after placing Foley catheter on 7/31.   Acute hypoxic resp failure requiring mechanical ventilation Rule out aspiration pneumonitis Acute on chronic dysphagia exacerbated by mental status changes COPD, questionably in exacerbation:  Continue home bronchodilators, I-S/flutter valve  Has 2 mm lung nodule which can be followed up outpatient.  Will need repeat CT chest in 3 months, this can be followed up by PCP/outpatient primary pulmonary.  Volume overload, elevated BNP with pulmonary edema Acute diastolic congestive heart failure with preserved EF -Last echocardiogram in 2015 showed EF 60%.  Elevated BNP.  Will give 40 mg IV Lasix today.  Repeat echocardiogram.  Replete magnesium.   Acute transient episode of A-fib with RVR, likely provoked, resolved Your EKG is normal sinus rhythm but possible episode of A-fib which appears to have resolved.  Benefit from outpatient Holter monitoring upon discharge   Anemia, chronic likely anemia of chronic disease versus blood loss Rule out acute bleed Possible some chronic vaginal spotting versus possible GI bleed.  Hemoglobin stable.  Family against any endoscopic or surgical procedure at this time therefore continue to monitor.    Acute metabolic acidosis Lactic acidosis, resolved -Resolved   Hypotension History of hypertension Discontinue Norvasc and Coreg.  IV fluid bolus.   Past medical history, stable CAD Hyperlipidemia Aspirin and Plavix    Multi-septated cystic ovarian neoplasm -will need to f/u outpt w/ GYN (will likely need hysterectomy)   T2DM, well-controlled -Well-controlled A1c 5.5 continue sliding scale insulin hypoglycemic protocol    Depression -Continue home  sertraline, hold nortriptyline  We should attempt out of bed to chair for   DVT prophylaxis: SCDs Start: 02/06/23 0402 Code Status:   DNR paperwork at bedside Family Communication: Son at bedside   Status is: Inpatient   Dispo: The patient is from: Home              Anticipated d/c is to: CIR              Anticipated d/c date is: Imminent             Getting diuretics.  Awaiting placement   Pressure Injury 02/07/23 Sacrum Mid Stage 2 -  Partial thickness loss of dermis presenting as a shallow open injury with a red, pink wound bed without slough. small circular area of broken skin without drainage (Active)  02/07/23 1500  Location: Sacrum  Location Orientation: Mid  Staging: Stage 2 -  Partial thickness loss of dermis presenting as a shallow open injury with a red, pink wound bed without slough.  Wound Description (Comments): small circular area of broken skin without drainage  Present on Admission: Yes     Diet Orders (From admission, onward)     Start     Ordered   02/17/23 1058  DIET DYS 3 Room service appropriate? No; Fluid consistency: Thin  Diet effective now       Comments: PLEASE!! PUREE MEATS, EXTRA GRAVY/SAUCE  Question Answer Comment  Room service appropriate? No   Fluid consistency: Thin      02/17/23 1058            Subjective: Doing okay no complaints at this time This morning denying any shortness of breath.  Son is present at bedside. Examination:  Constitutional: Not in acute distress Respiratory: Bibasilar crackles with mild expiratory wheezing Cardiovascular: Normal sinus rhythm, no rubs Abdomen: Nontender nondistended good bowel sounds Musculoskeletal: No edema noted Skin: No rashes seen Neurologic: CN 2-12 grossly intact.  And nonfocal Psychiatric: Normal judgment and insight. Alert and oriented x 3. Normal mood.  Objective: Vitals:   02/16/23 2155 02/17/23 0005 02/17/23 0500 02/17/23 0902  BP: (!) 130/56  128/65    Pulse: 95 95  80  Resp:    16  Temp: 98.2 F (36.8 C) 98.8 F (37.1 C)    TempSrc: Oral Oral    SpO2: 96% 100%  99%  Weight:   83 kg   Height:        Intake/Output Summary (Last 24 hours) at 02/17/2023 1226 Last data filed at 02/16/2023 1500 Gross per 24 hour  Intake --  Output 450 ml  Net -450 ml   Filed Weights   02/14/23 0430 02/16/23 0440 02/17/23 0500  Weight: 80.4 kg 81.6 kg 83 kg    Scheduled Meds:  arformoterol  15 mcg Nebulization BID   aspirin  81 mg Oral Q breakfast   Chlorhexidine Gluconate Cloth  6 each Topical Daily   clopidogrel  75 mg Oral Daily   dextromethorphan-guaiFENesin  2 tablet Oral BID   diclofenac Sodium  2 g Topical QID   feeding supplement  237 mL Oral BID BM   fenofibrate  54 mg Oral Daily   ferrous sulfate  325 mg Oral Once per day on Monday Thursday   folic acid  1 mg Oral Q breakfast   furosemide  20 mg Intravenous Once   insulin aspart  0-20 Units Subcutaneous TID WC   insulin aspart  0-5 Units Subcutaneous QHS   lamoTRIgine  25  mg Oral BID   mirtazapine  7.5 mg Oral QHS   mouth rinse  15 mL Mouth Rinse 4 times per day   pantoprazole  40 mg Oral Daily   revefenacin  175 mcg Nebulization Daily   rosuvastatin  40 mg Oral Daily   sertraline  100 mg Oral QHS   Continuous Infusions:  sodium chloride     magnesium sulfate bolus IVPB 2 g (02/17/23 1146)    Nutritional status Signs/Symptoms: meal completion < 50% Interventions: Magic cup, Ensure Enlive (each supplement provides 350kcal and 20 grams of protein) Body mass index is 30.45 kg/m.  Data Reviewed:   CBC: Recent Labs  Lab 02/14/23 0853 02/15/23 1117  WBC  --  8.7  HGB 10.3* 10.2*  HCT 32.6* 32.7*  MCV  --  90.1  PLT  --  313   Basic Metabolic Panel: Recent Labs  Lab 02/15/23 1531 02/16/23 0820 02/17/23 0235  NA 139 137 139  K 4.3 4.2 4.2  CL 106 100 100  CO2 24 27 27   GLUCOSE 98 145* 128*  BUN 18 18 21   CREATININE 0.81 0.89 1.00  CALCIUM 8.9 9.1  9.1  MG  --  1.8 1.6*   GFR: Estimated Creatinine Clearance: 41.4 mL/min (by C-G formula based on SCr of 1 mg/dL). Liver Function Tests: No results for input(s): "AST", "ALT", "ALKPHOS", "BILITOT", "PROT", "ALBUMIN" in the last 168 hours. No results for input(s): "LIPASE", "AMYLASE" in the last 168 hours. No results for input(s): "AMMONIA" in the last 168 hours. Coagulation Profile: No results for input(s): "INR", "PROTIME" in the last 168 hours. Cardiac Enzymes: No results for input(s): "CKTOTAL", "CKMB", "CKMBINDEX", "TROPONINI" in the last 168 hours. BNP (last 3 results) No results for input(s): "PROBNP" in the last 8760 hours. HbA1C: No results for input(s): "HGBA1C" in the last 72 hours. CBG: Recent Labs  Lab 02/16/23 0629 02/16/23 1230 02/16/23 1617 02/16/23 2150 02/17/23 0632  GLUCAP 152* 191* 339* 174* 168*   Lipid Profile: Recent Labs    02/16/23 0820  TRIG 290*   Thyroid Function Tests: No results for input(s): "TSH", "T4TOTAL", "FREET4", "T3FREE", "THYROIDAB" in the last 72 hours. Anemia Panel: No results for input(s): "VITAMINB12", "FOLATE", "FERRITIN", "TIBC", "IRON", "RETICCTPCT" in the last 72 hours. Sepsis Labs: No results for input(s): "PROCALCITON", "LATICACIDVEN" in the last 168 hours.  No results found for this or any previous visit (from the past 240 hour(s)).        Radiology Studies: DG Chest Port 1 View  Result Date: 02/15/2023 CLINICAL DATA:  Shortness of breath EXAM: PORTABLE CHEST 1 VIEW COMPARISON:  02/08/2023.  02/06/2023.  01/14/2021. FINDINGS: Heart size upper limits of normal. Chronic aortic atherosclerosis. Persistent interstitial lung density with basilar predominance. This could represent mild edema, mild pneumonia or possibly developing chronic lung disease. No dense consolidation or lobar collapse. No visible effusion. IMPRESSION: Persistent interstitial lung density with basilar predominance. This could represent mild edema, mild  pneumonia or possibly developing chronic lung disease. Electronically Signed   By: Paulina Fusi M.D.   On: 02/15/2023 16:21           LOS: 11 days   Time spent= 35 mins     Joline Maxcy, MD [facility]  If 7PM-7AM, please contact night-coverage  02/17/2023, 12:26 PM

## 2023-02-17 NOTE — Progress Notes (Signed)
  Echocardiogram 2D Echocardiogram has been performed.  Leda Roys RDCS 02/17/2023, 12:36 PM

## 2023-02-17 NOTE — TOC Progression Note (Addendum)
Transition of Care [facility]) - Progression Note    Patient Details  Name: Michelle [name] MRN: [id number] Date of Birth: 08/26/1932  [facility] of [facility]) CM/SW Contact  Dellie Burns [place], [place] Phone Number: 02/17/2023, 10:41 AM  Clinical Narrative:   per MD, possible dc over weekend. Requested CMA begin SNF auth for [facility]. Confirmed with Alvino Chapel at [facility] they are prepared to admit over weekend if auth received.    UPDATE 1400: [employer] auth received:[id number], valid 8/2-8/6. Will assist with dc pending medical clearance.     Dellie Burns, MSW, LCSW [phone] (coverage)      Expected Discharge Plan: Skilled Nursing Facility Barriers to Discharge: Continued Medical Work up, English as a second language teacher  Expected Discharge Plan and Services                                               Social Determinants of Health (SDOH) Interventions SDOH Screenings   Food Insecurity: No Food Insecurity (02/07/2023)  Housing: Patient Declined (02/07/2023)  Transportation Needs: No Transportation Needs (02/07/2023)  Utilities: Not At Risk (02/07/2023)  Depression (PHQ2-9): Medium Risk (08/19/2022)  Financial Resource Strain: Low Risk  (06/20/2022)  Physical Activity: Inactive (06/20/2022)  Social Connections: Socially Isolated (06/20/2022)  Stress: No Stress Concern Present (06/20/2022)  Tobacco Use: Medium Risk (02/07/2023)    Readmission Risk Interventions    09/01/2020   12:30 PM  Readmission Risk Prevention Plan  Transportation Screening Complete  PCP or Specialist Appt within 3-5 Days Complete  HRI or Home Care Consult Complete  Social Work Consult for Recovery Care Planning/Counseling Complete  Palliative Care Screening Not Applicable  Medication Review [employer]) Complete

## 2023-02-17 NOTE — Plan of Care (Signed)
  Problem: Education: Goal: Knowledge of General Education information will improve Description: Including pain rating scale, medication(s)/side effects and non-pharmacologic comfort measures Outcome: Progressing   Problem: Clinical Measurements: Goal: Ability to maintain clinical measurements within normal limits will improve Outcome: Progressing Goal: Will remain free from infection Outcome: Progressing   

## 2023-02-17 NOTE — Progress Notes (Signed)
Speech Language Pathology Treatment: Dysphagia  Patient Details Name: Michelle [name] MRN: [id number] DOB: 01-16-1933 Today's Date: 02/17/2023 Time: 9528-4132 SLP Time Calculation (min) (ACUTE ONLY): 35 min  Assessment / Plan / Recommendation   Pt seen at bedside for skilled ST intervention targeting goals for tolerance of least restrictive diet and education regarding safe swallow strategies. Pt's son Michelle Hua was present today. Pt initially reported no pain, however, later in session she indicated 10/10 pain in legs and feet. RN notified.  Pt's breakfast tray was largely untouched. Dys3 solids noted, with sausage patty cut in fourths. SLP checked diet order, which has instruction to puree meats and provide additional gravy/sauce for moisture. SLP contacted MD, RN, and RD to facilitate correcting this. Downgrading her entire diet to Dys2 is not recommended, as she already has poor PO intake, and downgrading her diet would further limit PO choices. If meats can be ground/pureed on Dys3 diet, pt may increase safe PO intake and have decreased risk of fatigue and aspiration.   SLP encouraged PO intake offering peanut butter graham crackers and thin liquids. Pt exhibited extended oral prep with solid textures, but was able to clear oral cavity with additional swallows and liquid wash. SLP provided education regarding safe swallow precautions, which are posted at [facility]. SLP encouraged pt to eat smaller more frequent meals/supplements to facilitate adequate PO intake over the day, rather than what could be considered an overwhelming amount of food given 3x/day. Will follow for assessment of diet tolerance and continued pt/family education. She would benefit from SLP follow up at next venue of care to maximize safe PO intake. Pt was left with chocolate pudding and Ensure at bedside.   HPI HPI: Michelle [name] is an 89 yo female who presented as trauma after having a seizure while utilizing the bedside toilet and hit  her head. Head CT and Chest CT 7/22 negative for acute findings.  Pt with hx HTN, DM2, Hyperlipidemia, Dementia, CAD, COPD.  SLP follow up for dysphagia management indicated - OT reports family states pt having some coughing with intake and oral holding causing concern for her airway protection with po intake.  Pt's diet was advanced to dys3/thin and then she "choked" with sausage at breakfast am of 02/13/2023.  Follow up for dysphagia management indicated.      SLP Plan  Continue with current plan of care      Recommendations for follow up therapy are one component of a multi-disciplinary discharge planning process, led by the attending physician.  Recommendations may be updated based on patient status, additional functional criteria and insurance authorization.    Recommendations  Diet recommendations: Dysphagia 3 (mechanical soft);Thin liquid - PUREE MEATS, add extra gravy/sauce for moisture Liquids provided via: Cup;Straw Medication Administration: Whole meds with liquid Supervision: Intermittent supervision to cue for compensatory strategies;Patient able to self feed Compensations: Slow rate;Small sips/bites;Minimize environmental distractions Postural Changes and/or Swallow Maneuvers: Seated upright 90 degrees                  Oral care BID;Other (Comment) (check for oral residue)   Frequent or constant Supervision/Assistance Dysphagia, oral phase (R13.11)     Continue with current plan of care     B. Murvin Natal, [facility], CCC-SLP Speech Language Pathologist Office: [phone]  Leigh Aurora 02/17/2023, 11:35 AM

## 2023-02-18 DIAGNOSIS — R338 Other retention of urine: Secondary | ICD-10-CM | POA: Diagnosis not present

## 2023-02-18 DIAGNOSIS — Z7902 Long term (current) use of antithrombotics/antiplatelets: Secondary | ICD-10-CM | POA: Diagnosis not present

## 2023-02-18 DIAGNOSIS — I110 Hypertensive heart disease with heart failure: Secondary | ICD-10-CM | POA: Diagnosis not present

## 2023-02-18 DIAGNOSIS — M25562 Pain in left knee: Secondary | ICD-10-CM | POA: Diagnosis not present

## 2023-02-18 DIAGNOSIS — S01111A Laceration without foreign body of right eyelid and periocular area, initial encounter: Secondary | ICD-10-CM | POA: Diagnosis not present

## 2023-02-18 DIAGNOSIS — R41841 Cognitive communication deficit: Secondary | ICD-10-CM | POA: Diagnosis not present

## 2023-02-18 DIAGNOSIS — F0394 Unspecified dementia, unspecified severity, with anxiety: Secondary | ICD-10-CM | POA: Diagnosis not present

## 2023-02-18 DIAGNOSIS — S0590XA Unspecified injury of unspecified eye and orbit, initial encounter: Secondary | ICD-10-CM | POA: Diagnosis not present

## 2023-02-18 DIAGNOSIS — S0101XA Laceration without foreign body of scalp, initial encounter: Secondary | ICD-10-CM | POA: Diagnosis not present

## 2023-02-18 DIAGNOSIS — W19XXXA Unspecified fall, initial encounter: Secondary | ICD-10-CM | POA: Diagnosis not present

## 2023-02-18 DIAGNOSIS — I959 Hypotension, unspecified: Secondary | ICD-10-CM | POA: Diagnosis not present

## 2023-02-18 DIAGNOSIS — R220 Localized swelling, mass and lump, head: Secondary | ICD-10-CM | POA: Diagnosis not present

## 2023-02-18 DIAGNOSIS — R1312 Dysphagia, oropharyngeal phase: Secondary | ICD-10-CM | POA: Diagnosis not present

## 2023-02-18 DIAGNOSIS — M6281 Muscle weakness (generalized): Secondary | ICD-10-CM | POA: Diagnosis not present

## 2023-02-18 DIAGNOSIS — N390 Urinary tract infection, site not specified: Secondary | ICD-10-CM | POA: Diagnosis not present

## 2023-02-18 DIAGNOSIS — R4182 Altered mental status, unspecified: Secondary | ICD-10-CM | POA: Diagnosis not present

## 2023-02-18 DIAGNOSIS — S0231XA Fracture of orbital floor, right side, initial encounter for closed fracture: Secondary | ICD-10-CM | POA: Diagnosis not present

## 2023-02-18 DIAGNOSIS — Z7982 Long term (current) use of aspirin: Secondary | ICD-10-CM | POA: Diagnosis not present

## 2023-02-18 DIAGNOSIS — E1165 Type 2 diabetes mellitus with hyperglycemia: Secondary | ICD-10-CM | POA: Diagnosis not present

## 2023-02-18 DIAGNOSIS — I10 Essential (primary) hypertension: Secondary | ICD-10-CM | POA: Diagnosis not present

## 2023-02-18 DIAGNOSIS — M25561 Pain in right knee: Secondary | ICD-10-CM | POA: Diagnosis not present

## 2023-02-18 DIAGNOSIS — N399 Disorder of urinary system, unspecified: Secondary | ICD-10-CM | POA: Diagnosis not present

## 2023-02-18 DIAGNOSIS — R7989 Other specified abnormal findings of blood chemistry: Secondary | ICD-10-CM | POA: Diagnosis not present

## 2023-02-18 DIAGNOSIS — Z23 Encounter for immunization: Secondary | ICD-10-CM | POA: Diagnosis not present

## 2023-02-18 DIAGNOSIS — M170 Bilateral primary osteoarthritis of knee: Secondary | ICD-10-CM | POA: Diagnosis not present

## 2023-02-18 DIAGNOSIS — I5032 Chronic diastolic (congestive) heart failure: Secondary | ICD-10-CM | POA: Diagnosis not present

## 2023-02-18 DIAGNOSIS — R569 Unspecified convulsions: Secondary | ICD-10-CM | POA: Diagnosis not present

## 2023-02-18 DIAGNOSIS — R296 Repeated falls: Secondary | ICD-10-CM | POA: Diagnosis not present

## 2023-02-18 DIAGNOSIS — G8929 Other chronic pain: Secondary | ICD-10-CM | POA: Diagnosis not present

## 2023-02-18 DIAGNOSIS — I5033 Acute on chronic diastolic (congestive) heart failure: Secondary | ICD-10-CM | POA: Diagnosis not present

## 2023-02-18 DIAGNOSIS — Z9181 History of falling: Secondary | ICD-10-CM | POA: Diagnosis not present

## 2023-02-18 DIAGNOSIS — G4089 Other seizures: Secondary | ICD-10-CM | POA: Diagnosis not present

## 2023-02-18 DIAGNOSIS — R350 Frequency of micturition: Secondary | ICD-10-CM | POA: Diagnosis not present

## 2023-02-18 DIAGNOSIS — R519 Headache, unspecified: Secondary | ICD-10-CM | POA: Diagnosis present

## 2023-02-18 DIAGNOSIS — I480 Paroxysmal atrial fibrillation: Secondary | ICD-10-CM | POA: Diagnosis not present

## 2023-02-18 DIAGNOSIS — D649 Anemia, unspecified: Secondary | ICD-10-CM | POA: Diagnosis not present

## 2023-02-18 DIAGNOSIS — Z7401 Bed confinement status: Secondary | ICD-10-CM | POA: Diagnosis not present

## 2023-02-18 DIAGNOSIS — I6900 Unspecified sequelae of nontraumatic subarachnoid hemorrhage: Secondary | ICD-10-CM | POA: Diagnosis not present

## 2023-02-18 DIAGNOSIS — J449 Chronic obstructive pulmonary disease, unspecified: Secondary | ICD-10-CM | POA: Diagnosis not present

## 2023-02-18 DIAGNOSIS — I509 Heart failure, unspecified: Secondary | ICD-10-CM | POA: Diagnosis not present

## 2023-02-18 DIAGNOSIS — R001 Bradycardia, unspecified: Secondary | ICD-10-CM | POA: Diagnosis not present

## 2023-02-18 DIAGNOSIS — I5022 Chronic systolic (congestive) heart failure: Secondary | ICD-10-CM | POA: Diagnosis not present

## 2023-02-18 DIAGNOSIS — W1811XA Fall from or off toilet without subsequent striking against object, initial encounter: Secondary | ICD-10-CM | POA: Diagnosis not present

## 2023-02-18 DIAGNOSIS — D509 Iron deficiency anemia, unspecified: Secondary | ICD-10-CM | POA: Diagnosis not present

## 2023-02-18 DIAGNOSIS — S0181XA Laceration without foreign body of other part of head, initial encounter: Secondary | ICD-10-CM | POA: Diagnosis not present

## 2023-02-18 DIAGNOSIS — R531 Weakness: Secondary | ICD-10-CM | POA: Diagnosis not present

## 2023-02-18 DIAGNOSIS — J9601 Acute respiratory failure with hypoxia: Secondary | ICD-10-CM | POA: Diagnosis not present

## 2023-02-18 DIAGNOSIS — J441 Chronic obstructive pulmonary disease with (acute) exacerbation: Secondary | ICD-10-CM | POA: Diagnosis not present

## 2023-02-18 DIAGNOSIS — R2681 Unsteadiness on feet: Secondary | ICD-10-CM | POA: Diagnosis not present

## 2023-02-18 DIAGNOSIS — R197 Diarrhea, unspecified: Secondary | ICD-10-CM | POA: Diagnosis not present

## 2023-02-18 LAB — GLUCOSE, CAPILLARY
Glucose-Capillary: 137 mg/dL — ABNORMAL HIGH (ref 70–99)
Glucose-Capillary: 234 mg/dL — ABNORMAL HIGH (ref 70–99)

## 2023-02-18 MED ORDER — DOCUSATE SODIUM 50 MG/5ML PO LIQD: 100.0000 mg | Freq: Two times a day (BID) | ORAL | Status: DC | PRN

## 2023-02-18 MED ORDER — POLYETHYLENE GLYCOL 3350 17 G PO PACK: 17.0000 g | PACK | Freq: Every day | ORAL | Status: DC | PRN

## 2023-02-18 MED ORDER — FUROSEMIDE 10 MG/ML IJ SOLN
20.0000 mg | Freq: Once | INTRAMUSCULAR | Status: AC
  Administered 2023-02-18: 20 mg via INTRAVENOUS
  Filled 2023-02-18: qty 4

## 2023-02-18 MED ORDER — MIRTAZAPINE 7.5 MG PO TABS: 7.5000 mg | ORAL_TABLET | Freq: Every day | ORAL | Status: DC

## 2023-02-18 NOTE — Discharge Summary (Signed)
Physician Discharge Summary  Michelle [name] ZOX:[id number] DOB: 07-Nov-1932 DOA: 02/06/2023  PCP: Shelva Majestic, MD  Admit date: 02/06/2023 Discharge date: 02/18/2023  Admitted From: Home Disposition:  SNF  Recommendations for Outpatient Follow-up:  Follow up with PCP in 1-2 weeks Please obtain BMP/CBC in one week your next doctors visit.  Mirtazapine bedtime Bowel regimen prescribed.  Adjust as necessary. Should closely monitor her volume status.  Can continue daily Lasix 20 mg but can get additional dose as necessary. Patient needs to get bronchodilators periodically Continue Foley catheter, voiding trial as she ambulates.  Monitor for any signs of infection PCP should repeat CT chest in about 3 months Recommend outpatient close follow-up with cardiology. She will also follow-up with outpatient GYN  Discharge Condition: Stable CODE STATUS: DNR Diet recommendation: Heart healthy-dysphagia 3 diet, thin liquids, pure all the meat, add gravy/sauce for moisture, hold meds with liquid.  Requires frequent intermittent supervision and cues but overall at times able to self-feed.  Keep her seated upright at 90 degrees while eating  Brief/Interim Summary:  89 year old female with past medical history of hypertension, diabetes type 2, hyperlipidemia, dementia, coronary disease, COPD with 55-pack-year smoking history presenting as a trauma after seizure while falling from a seated position on the toilet.  Seizure appears of lasted approximately 2 minutes, patient markedly confused postevent combative with EMS requiring Versed.  Patient ultimately intubated due to combative nature and concern for trauma.  PCCM and trauma teams consulted initially for admission.   Patient's acute metabolic encephalopathy continues to improve slowly, no further episodes of seizures at this time but no clear etiology for seizure provocation either.  Patient ultimately extubated 7/22, resumed on home Lamictal 25 twice  daily.   Patient transition to general medical floor with improvement daily but quite slow.  Continues to be agitated difficult to orient, likely some underlying hospital delirium as well.  Occasionally requiring restraints and mittens to protect lines and telemetry.  Patient more awake alert today with family at bedside attempting to feed herself breakfast. Overnight 7/25 patient noted to transition to A-fib with transient RVR now back to sinus rhythm this morning. Morning of the 26th patient is markedly improved alert oriented to person place situation and time, only orienting question incorrect was year which son states would not be abnormal for her.   Hospital course was briefly complicated by shortness of breath with volume overload requiring Lasix to which she responded well.  Echocardiogram showed preserved EF with area of hypokinesis but no other acute abnormality.  PT/OT recommends SNF, will transition patient to SNF. Without aggressive therapy, I expect rapid downward spiral/decline in patient's condition. High risk for readmission   Assessment & Plan:  Principal Problem:   Seizure (HCC) Active Problems:   Altered mental status   Scalp laceration      Acute metabolic encephalopathy, secondary to presumed breakthrough seizure, resolving Complicated by underlying dementia, trauma and head laceration Complicated by sundowning/hospital delirium Mental is stable.  CT head negative   Acute urinary retention - Improved after placing Foley catheter on 7/31.  Unfortunately patient continues to have retention therefore we will continue Foley catheter for now.  When she is ambulating better hopefully will have better chances to get this removed.  She is at high risk of infection therefore closely monitor this.   Acute hypoxic resp failure requiring mechanical ventilation Rule out aspiration pneumonitis Acute on chronic dysphagia exacerbated by mental status changes COPD, questionably in  exacerbation:  Continue home bronchodilators,  I-S/flutter valve  Has 2 mm lung nodule which can be followed up outpatient.  Will need repeat CT chest in 3 months, this can be followed up by PCP/outpatient primary pulmonary.   Volume overload, elevated BNP with pulmonary edema, improved Acute diastolic congestive heart failure with preserved EF -Last echocardiogram in 2015 showed EF 60%.  Elevated BNP which is improving.  Echocardiogram shows preserved EF with area of hypokinetic wall which likely appears to be chronic.  Should follow-up with outpatient cardiology.   Acute transient episode of A-fib with RVR, likely provoked, resolved Your EKG is normal sinus rhythm but possible episode of A-fib which appears to have resolved.  Benefit from outpatient Holter monitoring upon discharge   Anemia, chronic likely anemia of chronic disease versus blood loss Rule out acute bleed Possible some chronic vaginal spotting versus possible GI bleed.  Hemoglobin stable.  Family against any endoscopic or surgical procedure at this time therefore continue to monitor.     Acute metabolic acidosis Lactic acidosis, resolved -Resolved   Hypotension History of hypertension Discontinue Norvasc and Coreg.    Past medical history, stable CAD Hyperlipidemia Aspirin and Plavix    Multi-septated cystic ovarian neoplasm -will need to f/u outpt w/ GYN (will likely need hysterectomy)   T2DM, well-controlled -Well-controlled A1c 5.5 continue sliding scale insulin hypoglycemic protocol   Depression -home meds     Pressure Injury 02/07/23 Sacrum Mid Stage 2 -  Partial thickness loss of dermis presenting as a shallow open injury with a red, pink wound bed without slough. small circular area of broken skin without drainage (Active)  02/07/23 1500  Location: Sacrum  Location Orientation: Mid  Staging: Stage 2 -  Partial thickness loss of dermis presenting as a shallow open injury with a red, pink wound bed  without slough.  Wound Description (Comments): small circular area of broken skin without drainage  Present on Admission: Yes      Discharge Diagnoses:  Principal Problem:   Seizure (HCC) Active Problems:   Altered mental status   Scalp laceration      Consultations: Palliative care General surgery Neurology  Subjective: Seen at bedside.  Some wheezing but overall okay.  Occasionally has moments of confusion.  Son is present at bedside.  I explained to him very clearly that patient really needs to ambulate and mobilize to help with any sort of expected recovery  Discharge Exam: Vitals:   02/18/23 0414 02/18/23 0919  BP: 129/67 (!) 128/54  Pulse: 99 99  Resp: 16 20  Temp: 98.4 F (36.9 C) 98.3 F (36.8 C)  SpO2: 94% 97%   Vitals:   02/17/23 2339 02/18/23 0414 02/18/23 0500 02/18/23 0919  BP: 134/62 129/67  (!) 128/54  Pulse: 87 99  99  Resp: 20 16  20   Temp: 97.8 F (36.6 C) 98.4 F (36.9 C)  98.3 F (36.8 C)  TempSrc: Oral Oral  Oral  SpO2: 100% 94%  97%  Weight:   83.1 kg   Height:        General: Pt is alert, awake, not in acute distress Cardiovascular: RRR, S1/S2 +, no rubs, no gallops Respiratory: Very mild bilateral expiratory wheezing Abdominal: Soft, NT, ND, bowel sounds + Extremities: no edema, no cyanosis  Discharge Instructions   Allergies as of 02/18/2023       Reactions   Codeine Sulfate Nausea Only   Morphine Sulfate Nausea Only        Medication List     TAKE these medications  Accu-Chek [employer] Kit 1 Device by Does not apply route. Use as directed for testing blood sugar   Accu-Chek FastClix Lancet Kit Use as directed to test blood sugar   acetaminophen 650 MG CR tablet Commonly known as: TYLENOL Take 1,300 mg by mouth 3 (three) times daily.   albuterol (2.5 MG/3ML) 0.083% nebulizer solution Commonly known as: PROVENTIL USE THREE MILLILITERS VIA NEBULIZATION BY MOUTH EVERY 6 HOURS AS NEEDED FOR WHEEZING OR  SHORTNESS OF BREATH What changed: See the new instructions.   albuterol 108 (90 Base) MCG/ACT inhaler Commonly known as: VENTOLIN HFA INHALE 1 PUFF EVERY 4 HOURS AS NEEDED FOR WHEEZING, SHORTNESS OF BREATH (RESCUE INHALER IF ADVAIR NOT WORKING) What changed: See the new instructions.   amLODipine 2.5 MG tablet Commonly known as: NORVASC Take 2.5 mg in the morning. If afternoon blood pressure >145/90 take second tablet of 2.5   aspirin 81 MG tablet Take 81 mg by mouth daily with breakfast.   blood glucose meter kit and supplies Kit Dispense based on patient and insurance preference. Use up to four times daily as directed. Dx:E11.9   calcium carbonate 1500 (600 Ca) MG Tabs tablet Commonly known as: OSCAL Take 600 mg of elemental calcium by mouth daily with breakfast.   carvedilol 12.5 MG tablet Commonly known as: COREG Take 1 tablet (12.5 mg total) by mouth 2 (two) times daily.   cholecalciferol 25 MCG (1000 UNIT) tablet Commonly known as: VITAMIN D3 Take 1,000 Units by mouth daily with breakfast.   clopidogrel 75 MG tablet Commonly known as: PLAVIX TAKE 1 TABLET EVERY DAY   cyanocobalamin 1000 MCG tablet Commonly known as: VITAMIN B12 Take 1,000 mcg by mouth daily with breakfast.   diclofenac Sodium 1 % Gel Commonly known as: VOLTAREN APPLY 2 GRAMS TOPICALLY TO AFFECTED AREA FOUR TIMES A DAY   docusate 50 MG/5ML liquid Commonly known as: COLACE Take 10 mLs (100 mg total) by mouth 2 (two) times daily as needed for mild constipation.   Droplet Pen Needles 31G X 8 MM Misc Generic drug: Insulin Pen Needle USE AS DIRECTED WITH LANTUS SOLOSTAR PEN. Dx. E11.9   DropSafe Alcohol Prep 70 % Pads USE FOR TESTING THREE TIMES DAILY   enalapril 20 MG tablet Commonly known as: VASOTEC TAKE 1 TABLET TWICE DAILY   fenofibrate micronized 134 MG capsule Commonly known as: LOFIBRA TAKE 1 CAPSULE EVERY DAY   ferrous sulfate 325 (65 FE) MG tablet Twice a week What changed:   how much to take how to take this when to take this additional instructions   fluticasone-salmeterol 250-50 MCG/ACT Aepb Commonly known as: ADVAIR INHALE 1 PUFF TWICE DAILY   folic acid 400 MCG tablet Commonly known as: FOLVITE Take 400 mcg by mouth daily with breakfast.   furosemide 20 MG tablet Commonly known as: LASIX TAKE 1 TABLET EVERY DAY   hydrALAZINE 50 MG tablet Commonly known as: APRESOLINE Take 1 tablet (50 mg total) by mouth 3 (three) times daily.   icosapent Ethyl 1 g capsule Commonly known as: Vascepa Take 2 capsules (2 g total) by mouth 2 (two) times daily.   lamoTRIgine 25 MG tablet Commonly known as: LAMICTAL TAKE 1 TABLET TWICE DAILY   Lantus SoloStar 100 UNIT/ML Solostar Pen Generic drug: insulin glargine INJECT 17 TO 20 UNITS UNDER THE SKIN EVERY MORNING What changed:  how much to take how to take this when to take this additional instructions   loratadine 10 MG tablet Commonly known as: CLARITIN Take  10 mg by mouth every other day.   medroxyPROGESTERone 10 MG tablet Commonly known as: Provera Take 1 tablet (10 mg total) by mouth daily.   Melatonin 5 MG Caps Take 5 mg by mouth at bedtime.   metFORMIN 500 MG 24 hr tablet Commonly known as: GLUCOPHAGE-XR Take 1 tablet (500 mg total) by mouth daily with breakfast.   mirtazapine 7.5 MG tablet Commonly known as: REMERON Take 1 tablet (7.5 mg total) by mouth at bedtime.   nitroGLYCERIN 0.4 MG SL tablet Commonly known as: NITROSTAT DISSOLVE 1 TAB UNDER TONGUE FOR CHEST PAIN - IF PAIN REMAINS AFTER 5 MIN, CALL 911 AND REPEAT DOSE. MAX 3 TABS IN 15 MINUTES What changed:  how much to take how to take this when to take this reasons to take this   nortriptyline 10 MG capsule Commonly known as: PAMELOR TAKE 1 CAPSULE BY MOUTH AT BEDTIME AS NEEDED FOR PAIN PREVENTION What changed: See the new instructions.   pantoprazole 40 MG tablet Commonly known as: PROTONIX TAKE 1 TABLET EVERY DAY    polyethylene glycol 17 g packet Commonly known as: MIRALAX / GLYCOLAX Take 17 g by mouth daily as needed for moderate constipation.   psyllium 58.6 % packet Commonly known as: METAMUCIL Take 1 packet by mouth daily.   rosuvastatin 40 MG tablet Commonly known as: CRESTOR TAKE 1 TABLET EVERY DAY   sertraline 50 MG tablet Commonly known as: ZOLOFT TAKE 2 TABLETS AT BEDTIME   Spiriva HandiHaler 18 MCG inhalation capsule Generic drug: tiotropium PLACE 1 CAPSULE INTO HANDIHALER AND INHALE THE CONTENTS DAILY What changed: See the new instructions.   spironolactone 25 MG tablet Commonly known as: ALDACTONE TAKE 1 TABLET EVERY DAY   True Metrix Blood Glucose Test test strip Generic drug: glucose blood TEST BLOOD SUGAR UP TO FOUR TIMES DAILY AS DIRECTED   TRUEplus Lancets 33G Misc TEST BLOOD SUGAR UP TO FOUR TIMES DAILY AS DIRECTED   vitamin E 180 MG (400 UNITS) capsule Take 400 Units by mouth at bedtime.        Contact information for follow-up providers     [facility] Follow up in 2 week(s).   Why: For wound re-check Contact information: [address] [place] [address] [phone]        Shelva Majestic, MD Follow up in 1 week(s).   Specialty: Family Medicine Contact information: [address] [place] [address] [phone]              Contact information for after-discharge care     Destination     [facility] Preferred SNF .   Service: Skilled Nursing Contact information: [address] [place] [address] [phone]                    Allergies  Allergen Reactions   Codeine Sulfate Nausea Only   Morphine Sulfate Nausea Only    You were cared for by a hospitalist during your hospital stay. If you have any questions about your discharge medications or the care you received while you were in the hospital after you are  discharged, you can call the unit and asked to speak with the hospitalist on call if the hospitalist that took care of you is not available. Once you are discharged, your primary care physician will handle any further medical issues. Please note that no refills for any discharge medications will be authorized  once you are discharged, as it is imperative that you return to your primary care physician (or establish a relationship with a primary care physician if you do not have one) for your aftercare needs so that they can reassess your need for medications and monitor your lab values.  You were cared for by a hospitalist during your hospital stay. If you have any questions about your discharge medications or the care you received while you were in the hospital after you are discharged, you can call the unit and asked to speak with the hospitalist on call if the hospitalist that took care of you is not available. Once you are discharged, your primary care physician will handle any further medical issues. Please note that NO REFILLS for any discharge medications will be authorized once you are discharged, as it is imperative that you return to your primary care physician (or establish a relationship with a primary care physician if you do not have one) for your aftercare needs so that they can reassess your need for medications and monitor your lab values.  Please request your Prim.MD to go over all Hospital Tests and Procedure/Radiological results at the follow up, please get all Hospital records sent to your Prim MD by signing hospital release before you go home.  Get CBC, CMP, 2 view Chest X ray checked  by Primary MD during your next visit or SNF MD in 5-7 days ( we routinely change or add medications that can affect your baseline labs and fluid status, therefore we recommend that you get the mentioned basic workup next visit with your PCP, your PCP may decide not to get them or add new tests based on their  clinical decision)  On your next visit with your primary care physician please Get Medicines reviewed and adjusted.  If you experience worsening of your admission symptoms, develop shortness of breath, life threatening emergency, suicidal or homicidal thoughts you must seek medical attention immediately by calling 911 or calling your MD immediately  if symptoms less severe.  You Must read complete instructions/literature along with all the possible adverse reactions/side effects for all the Medicines you take and that have been prescribed to you. Take any new Medicines after you have completely understood and accpet all the possible adverse reactions/side effects.   Do not drive, operate heavy machinery, perform activities at heights, swimming or participation in water activities or provide baby sitting services if your were admitted for syncope or siezures until you have seen by Primary MD or a Neurologist and advised to do so again.  Do not drive when taking Pain medications.   Procedures/Studies: ECHOCARDIOGRAM COMPLETE  Result Date: 02/17/2023    ECHOCARDIOGRAM REPORT   Patient Name:   Raea A Rozman Date of Exam: 02/17/2023 Medical Rec #:  [id number]       Height:       65.0 in Accession #:    [id number]      Weight:       183.0 lb Date of Birth:  [birth year]       BSA:          1.905 m Patient Age:    89 years        BP:           128/65 mmHg Patient Gender: F               HR:           97 bpm. Exam Location:  Inpatient Procedure:  Color Doppler, Cardiac Doppler and 2D Echo Indications:    I42.9 Cardiomyopathy (unspecified)  History:        Patient has prior history of Echocardiogram examinations, most                 recent 03/05/2014. CAD and Previous Myocardial Infarction, COPD;                 Risk Factors:Diabetes, Hypertension and Dyslipidemia.  Sonographer:    Harriette Bouillon RDCS Referring Phys: [phone]  [place]  IMPRESSIONS  1. Left ventricular ejection fraction, by estimation, is 50  to 55%. The left ventricle has low normal function. The basal LV is hyperkinetic. There is hypokinesis of the LV apex extending to the mid to basal anteroseptal wall. Limited visualization of endocardium. Left ventricular diastolic parameters are indeterminate.  2. Right ventricular systolic function is normal. The right ventricular size is normal.  3. The mitral valve is normal in structure. No evidence of mitral valve regurgitation. No evidence of mitral stenosis.  4. The aortic valve is normal in structure. There is mild calcification of the aortic valve. Aortic valve regurgitation is not visualized. No aortic stenosis is present.  5. The inferior vena cava is normal in size with greater than 50% respiratory variability, suggesting right atrial pressure of 3 mmHg. FINDINGS  Left Ventricle: Left ventricular ejection fraction, by estimation, is 50 to 55%. The left ventricle has low normal function. The left ventricle demonstrates regional wall motion abnormalities. The left ventricular internal cavity size was normal in size. There is no left ventricular hypertrophy. Left ventricular diastolic parameters are indeterminate.  LV Wall Scoring: The mid and distal anterior septum, apical lateral segment, mid inferoseptal segment, and apex are hypokinetic. Right Ventricle: The right ventricular size is normal. No increase in right ventricular wall thickness. Right ventricular systolic function is normal. Left Atrium: Left atrial size was normal in size. Right Atrium: Right atrial size was normal in size. Pericardium: There is no evidence of pericardial effusion. Mitral Valve: The mitral valve is normal in structure. No evidence of mitral valve regurgitation. No evidence of mitral valve stenosis. Tricuspid Valve: The tricuspid valve is normal in structure. Tricuspid valve regurgitation is not demonstrated. No evidence of tricuspid stenosis. Aortic Valve: The aortic valve is normal in structure. There is mild calcification  of the aortic valve. Aortic valve regurgitation is not visualized. No aortic stenosis is present. Pulmonic Valve: The pulmonic valve was normal in structure. Pulmonic valve regurgitation is not visualized. No evidence of pulmonic stenosis. Aorta: The aortic root is normal in size and structure. Venous: The inferior vena cava is normal in size with greater than 50% respiratory variability, suggesting right atrial pressure of 3 mmHg. IAS/Shunts: No atrial level shunt detected by color flow Doppler.  LEFT VENTRICLE PLAX 2D LVIDd:         5.20 cm LVIDs:         3.40 cm LV PW:         1.00 cm LV IVS:        1.00 cm LVOT diam:     1.90 cm LVOT Area:     2.84 cm  LEFT ATRIUM           Index LA diam:      3.80 cm 1.99 cm/m LA Vol (A4C): 28.2 ml 14.80 ml/m   AORTA Ao Root diam: 2.90 cm Ao Asc diam:  2.90 cm MITRAL VALVE MV Area (PHT): 4.52 cm     SHUNTS  MV Decel Time: 168 msec     Systemic Diam: 1.90 cm MV E velocity: 99.10 cm/s MV A velocity: 104.00 cm/s MV E/A ratio:  0.95 Aditya Sabharwal Electronically signed by Dorthula Nettles Signature Date/Time: 02/17/2023/1:11:38 PM    Final    DG Chest Port 1 View  Result Date: 02/15/2023 CLINICAL DATA:  Shortness of breath EXAM: PORTABLE CHEST 1 VIEW COMPARISON:  02/08/2023.  02/06/2023.  01/14/2021. FINDINGS: Heart size upper limits of normal. Chronic aortic atherosclerosis. Persistent interstitial lung density with basilar predominance. This could represent mild edema, mild pneumonia or possibly developing chronic lung disease. No dense consolidation or lobar collapse. No visible effusion. IMPRESSION: Persistent interstitial lung density with basilar predominance. This could represent mild edema, mild pneumonia or possibly developing chronic lung disease. Electronically Signed   By: Paulina Fusi M.D.   On: 02/15/2023 16:21   DG CHEST PORT 1 VIEW  Result Date: 02/08/2023 CLINICAL DATA:  Hypoxia. EXAM: PORTABLE CHEST 1 VIEW COMPARISON:  Chest CT 02/06/2023. Radiographs  02/06/2023 and 01/16/2023. FINDINGS: 1008 hours. Interval extubation and removal of the enteric tube. The heart size and mediastinal contours are stable with aortic atherosclerosis. The overall pulmonary aeration has mildly improved. Unchanged interstitial prominence and patchy opacities at both lung bases. No confluent airspace disease, pneumothorax or significant pleural effusion. The bones appear unchanged. IMPRESSION: Mildly improved pulmonary aeration following extubation. No acute cardiopulmonary process. Electronically Signed   By: Carey Bullocks M.D.   On: 02/08/2023 14:00   DG Shoulder Left Port  Result Date: 02/06/2023 CLINICAL DATA:  Bruising EXAM: LEFT SHOULDER COMPARISON:  None Available. FINDINGS: No evidence of fracture or dislocation. There is no evidence of arthropathy or other focal bone abnormality. Partially visualized ETT and NG/OG tube. Soft tissues are unremarkable. IMPRESSION: No acute osseous abnormality. Electronically Signed   By: Allegra Lai M.D.   On: 02/06/2023 13:45   EEG adult  Result Date: 02/06/2023 Charlsie Quest, MD     02/06/2023  9:59 AM Patient Name: AISSA LISOWSKI MRN: [id number] Epilepsy Attending: Charlsie Quest Referring Physician/Provider: Briant Sites, DO Date: 02/06/2023 Duration: 23.55 mins Patient history: 89yo F with h/o seizures now with ams. EG to evaluate for seizure Level of alertness: lethargic, sleep AEDs during EEG study: LTG Technical aspects: This EEG study was done with scalp electrodes positioned according to the 10-20 International system of electrode placement. Electrical activity was reviewed with band pass filter of 1-70Hz , sensitivity of 7 uV/mm, display speed of 14mm/sec with a 60Hz  notched filter applied as appropriate. EEG data were recorded continuously and digitally stored.  Video monitoring was available and reviewed as appropriate. Description: EEG showed continuous/intermittent generalized polymorphic sharply contoured 3  to 6 Hz theta-delta slowing. Sleep was characterized by vertex waves, sleep spindles (12 to 14 Hz), maximal frontocentral region. Hyperventilation and photic stimulation were not performed.   ABNORMALITY - Continuous slow, generalized IMPRESSION: This study is suggestive of moderate to severe diffuse encephalopathy, nonspecific etiology. No seizures or epileptiform discharges were seen throughout the recording. Charlsie Quest   DG Knee 1-2 Views Left  Result Date: 02/06/2023 CLINICAL DATA:  Fall EXAM: LEFT KNEE - 1-2 VIEW COMPARISON:  None Available. FINDINGS: Advanced tricompartment degenerative changes with joint space narrowing and spurring. No joint effusion. No acute bony abnormality. Specifically, no fracture, subluxation, or dislocation. Soft tissues are intact. Vascular calcifications noted. IMPRESSION: Advanced tricompartment degenerative changes. No acute bony abnormality. Electronically Signed   By: Charlett Nose M.D.   On: 02/06/2023  03:05   DG FEMUR MIN 2 VIEWS LEFT  Result Date: 02/06/2023 CLINICAL DATA:  Fall EXAM: LEFT FEMUR 2 VIEWS COMPARISON:  None Available. FINDINGS: No acute bony abnormality. Specifically, no fracture, subluxation, or dislocation. Advanced tricompartment degenerative changes in the left knee. No joint effusion. Soft tissues are intact. IMPRESSION: No acute bony abnormality. Electronically Signed   By: Charlett Nose M.D.   On: 02/06/2023 03:05   DG Tibia/Fibula Left  Result Date: 02/06/2023 CLINICAL DATA:  Fall EXAM: LEFT TIBIA AND FIBULA - 2 VIEW COMPARISON:  None Available. FINDINGS: No acute bony abnormality. Specifically, no fracture, subluxation, or dislocation. Advanced tricompartment degenerative changes in the left knee. Soft tissues are intact. Mild anterior soft tissue swelling over the proximal tibia. IMPRESSION: No acute bony abnormality. Electronically Signed   By: Charlett Nose M.D.   On: 02/06/2023 03:04   CT CERVICAL SPINE WO CONTRAST  Result Date:  02/06/2023 CLINICAL DATA:  Trauma. EXAM: CT HEAD WITHOUT CONTRAST CT CERVICAL SPINE WITHOUT CONTRAST TECHNIQUE: Multidetector CT imaging of the head and cervical spine was performed following the standard protocol without intravenous contrast. Multiplanar CT image reconstructions of the cervical spine were also generated. RADIATION DOSE REDUCTION: This exam was performed according to the departmental dose-optimization program which includes automated exposure control, adjustment of the mA and/or kV according to patient size and/or use of iterative reconstruction technique. COMPARISON:  None Available. FINDINGS: CT HEAD FINDINGS Brain: Mild age-related atrophy and moderate chronic microvascular ischemic changes. There is no acute intracranial hemorrhage. No mass effect or midline shift. No extra-axial fluid collection. Vascular: No hyperdense vessel or unexpected calcification. Skull: Normal. Negative for fracture or focal lesion. Sinuses/Orbits: There is mucoperiosteal thickening the ethmoid air cells. The maxillary sinuses and mastoid air cells are clear. No air-fluid level. Other: Laceration of the scalp over the vertex with multiple skin staples. CT CERVICAL SPINE FINDINGS Alignment: No acute subluxation.  Grade 1 C6-C7 anterolisthesis. Skull base and vertebrae: No acute fracture.  Osteopenia. Soft tissues and spinal canal: No prevertebral fluid or swelling. No visible canal hematoma. Disc levels:  No acute findings.  Degenerative changes. Upper chest: Biapical subpleural scarring. Other: An endotracheal and enteric tubes are partially visualized. IMPRESSION: 1. No acute intracranial pathology. Mild age-related atrophy and moderate chronic microvascular ischemic changes. 2. No acute/traumatic cervical spine pathology. Electronically Signed   By: Elgie Collard M.D.   On: 02/06/2023 02:52   CT HEAD WO CONTRAST  Result Date: 02/06/2023 CLINICAL DATA:  Trauma. EXAM: CT HEAD WITHOUT CONTRAST CT CERVICAL SPINE  WITHOUT CONTRAST TECHNIQUE: Multidetector CT imaging of the head and cervical spine was performed following the standard protocol without intravenous contrast. Multiplanar CT image reconstructions of the cervical spine were also generated. RADIATION DOSE REDUCTION: This exam was performed according to the departmental dose-optimization program which includes automated exposure control, adjustment of the mA and/or kV according to patient size and/or use of iterative reconstruction technique. COMPARISON:  None Available. FINDINGS: CT HEAD FINDINGS Brain: Mild age-related atrophy and moderate chronic microvascular ischemic changes. There is no acute intracranial hemorrhage. No mass effect or midline shift. No extra-axial fluid collection. Vascular: No hyperdense vessel or unexpected calcification. Skull: Normal. Negative for fracture or focal lesion. Sinuses/Orbits: There is mucoperiosteal thickening the ethmoid air cells. The maxillary sinuses and mastoid air cells are clear. No air-fluid level. Other: Laceration of the scalp over the vertex with multiple skin staples. CT CERVICAL SPINE FINDINGS Alignment: No acute subluxation.  Grade 1 C6-C7 anterolisthesis. Skull base and  vertebrae: No acute fracture.  Osteopenia. Soft tissues and spinal canal: No prevertebral fluid or swelling. No visible canal hematoma. Disc levels:  No acute findings.  Degenerative changes. Upper chest: Biapical subpleural scarring. Other: An endotracheal and enteric tubes are partially visualized. IMPRESSION: 1. No acute intracranial pathology. Mild age-related atrophy and moderate chronic microvascular ischemic changes. 2. No acute/traumatic cervical spine pathology. Electronically Signed   By: Elgie Collard M.D.   On: 02/06/2023 02:52   CT CHEST ABDOMEN PELVIS W CONTRAST  Result Date: 02/06/2023 CLINICAL DATA:  Seizure and fall. EXAM: CT CHEST, ABDOMEN, AND PELVIS WITH CONTRAST TECHNIQUE: Multidetector CT imaging of the chest, abdomen  and pelvis was performed following the standard protocol during bolus administration of intravenous contrast. RADIATION DOSE REDUCTION: This exam was performed according to the departmental dose-optimization program which includes automated exposure control, adjustment of the mA and/or kV according to patient size and/or use of iterative reconstruction technique. CONTRAST:  75mL OMNIPAQUE IOHEXOL 350 MG/ML SOLN COMPARISON:  Chest radiograph dated 02/06/2023. FINDINGS: Evaluation of this exam is limited in the absence of intravenous contrast. CT CHEST FINDINGS Cardiovascular: There is no cardiomegaly. Three vessel coronary vascular calcification. Trace pericardial effusion. There is moderate atherosclerotic calcification of the thoracic aorta. No aneurysmal dilatation or dissection. The central pulmonary arteries appear patent. Mediastinum/Nodes: No hilar or mediastinal adenopathy. An enteric tube is noted in the esophagus. Subcentimeter left thyroid hypodense nodule. Not clinically significant; no follow-up imaging recommended (ref: J Am Coll Radiol. 2015 Feb;12(2): 143-50).No mediastinal fluid collection. Lungs/Pleura: Right upper and right lower lobe subpleural atelectasis versus less likely contusion. There is a 2 cm focus of ground-glass nodularity in the superior segment of the left lower lobe. Additional 1.8 cm nodule in the lingula versus scarring. There is no pleural effusion or pneumothorax. Endotracheal tube with tip at the level of the carina. Recommend retraction by 3 cm. The central airways are patent. Musculoskeletal: Degenerative changes of the spine. No acute osseous pathology. CT ABDOMEN PELVIS FINDINGS No intra-abdominal free air or free fluid. Hepatobiliary: The liver is unremarkable. There is biliary dilatation, post cholecystectomy. No retained calcified stone noted in the central CBD. Pancreas: Unremarkable. No pancreatic ductal dilatation or surrounding inflammatory changes. Spleen: Normal in  size without focal abnormality. Adrenals/Urinary Tract: The adrenal glands are unremarkable. There is no hydronephrosis on either side. Several subcentimeter bilateral renal hypodense lesions are too small to characterize. The visualized ureters and urinary bladder appear unremarkable. Stomach/Bowel: Enteric tube with tip in the body of the stomach. There is no bowel obstruction or active inflammation. The appendix is not visualized with certainty. No inflammatory changes identified in the right lower quadrant. Vascular/Lymphatic: Advanced aortoiliac atherosclerotic disease. The IVC is unremarkable. No portal venous gas. There is no adenopathy. Reproductive: The uterus is anteverted. There is a small calcified uterine fibroid. A 12 x 16 x 17 cm cystic lesion with internal septation in the pelvis most consistent with and ovarian neoplasm. Other: None Musculoskeletal: Osteopenia with degenerative changes of the spine. No acute osseous pathology. IMPRESSION: 1. No acute/traumatic intrathoracic, abdominal, or pelvic pathology. 2. Endotracheal tube with tip at the level of the carina. Recommend retraction by 3 cm. 3. Multi-septated cystic ovarian neoplasm. Gynecology referral and further evaluation with MRI without and with contrast on a nonemergent/outpatient basis recommended. 4. Pulmonary nodules as above measure up to 2 cm. Clinical correlation and follow-up with CT in 3 months recommended. 5. Right lung subpleural atelectasis versus less likely contusion. 6.  Aortic Atherosclerosis (ICD10-I70.0). Electronically  Signed   By: Elgie Collard M.D.   On: 02/06/2023 02:52   DG Pelvis Portable  Result Date: 02/06/2023 CLINICAL DATA:  Trauma, fall EXAM: PORTABLE PELVIS 1-2 VIEWS COMPARISON:  None Available. FINDINGS: There is no evidence of pelvic fracture or diastasis. No pelvic bone lesions are seen. Hip joints and SI joints symmetric. IMPRESSION: No acute bony abnormality. Electronically Signed   By: Charlett Nose  M.D.   On: 02/06/2023 02:11   DG Chest Port 1 View  Result Date: 02/06/2023 CLINICAL DATA:  Trauma, fall EXAM: PORTABLE CHEST 1 VIEW COMPARISON:  01/16/2023 FINDINGS: NG tube enters the stomach. Endotracheal tube is 1.3 cm above the carina. Heart and mediastinal contours are within normal limits. Aortic atherosclerosis. No confluent airspace opacities or effusions. No acute bony abnormality. IMPRESSION: No acute cardiopulmonary disease. Endotracheal tube 1.3 cm above the carina. Electronically Signed   By: Charlett Nose M.D.   On: 02/06/2023 02:10     The results of significant diagnostics from this hospitalization (including imaging, microbiology, ancillary and laboratory) are listed below for reference.     Microbiology: No results found for this or any previous visit (from the past 240 hour(s)).   Labs: BNP (last 3 results) Recent Labs    02/16/23 1045 02/18/23 0212  BNP 1,115.4* 670.9*   Basic Metabolic Panel: Recent Labs  Lab 02/15/23 1531 02/16/23 0820 02/17/23 0235 02/18/23 0212  NA 139 137 139 136  K 4.3 4.2 4.2 3.8  CL 106 100 100 99  CO2 24 27 27 26   GLUCOSE 98 145* 128* 140*  BUN 18 18 21  24*  CREATININE 0.81 0.89 1.00 0.91  CALCIUM 8.9 9.1 9.1 8.9  MG  --  1.8 1.6* 2.2   Liver Function Tests: No results for input(s): "AST", "ALT", "ALKPHOS", "BILITOT", "PROT", "ALBUMIN" in the last 168 hours. No results for input(s): "LIPASE", "AMYLASE" in the last 168 hours. No results for input(s): "AMMONIA" in the last 168 hours. CBC: Recent Labs  Lab 02/14/23 0853 02/15/23 1117 02/18/23 1031  WBC  --  8.7 7.3  HGB 10.3* 10.2* 10.0*  HCT 32.6* 32.7* 31.4*  MCV  --  90.1 92.6  PLT  --  313 277   Cardiac Enzymes: No results for input(s): "CKTOTAL", "CKMB", "CKMBINDEX", "TROPONINI" in the last 168 hours. BNP: Invalid input(s): "POCBNP" CBG: Recent Labs  Lab 02/17/23 0632 02/17/23 1239 02/17/23 1705 02/17/23 2112 02/18/23 0710  GLUCAP 168* 190* 166* 141*  137*   D-Dimer No results for input(s): "DDIMER" in the last 72 hours. Hgb A1c No results for input(s): "HGBA1C" in the last 72 hours. Lipid Profile Recent Labs    02/16/23 0820  TRIG 290*   Thyroid function studies No results for input(s): "TSH", "T4TOTAL", "T3FREE", "THYROIDAB" in the last 72 hours.  Invalid input(s): "FREET3" Anemia work up No results for input(s): "VITAMINB12", "FOLATE", "FERRITIN", "TIBC", "IRON", "RETICCTPCT" in the last 72 hours. Urinalysis    Component Value Date/Time   COLORURINE YELLOW 02/15/2023 1326   APPEARANCEUR CLEAR 02/15/2023 1326   LABSPEC 1.017 02/15/2023 1326   PHURINE 5.0 02/15/2023 1326   GLUCOSEU NEGATIVE 02/15/2023 1326   HGBUR NEGATIVE 02/15/2023 1326   BILIRUBINUR NEGATIVE 02/15/2023 1326   BILIRUBINUR neg 01/14/2021 1535   KETONESUR NEGATIVE 02/15/2023 1326   PROTEINUR 30 (A) 02/15/2023 1326   UROBILINOGEN 0.2 01/14/2021 1535   NITRITE NEGATIVE 02/15/2023 1326   LEUKOCYTESUR NEGATIVE 02/15/2023 1326   Sepsis Labs Recent Labs  Lab 02/15/23 1117 02/18/23 1031  WBC 8.7 7.3   Microbiology No results found for this or any previous visit (from the past 240 hour(s)).   Time coordinating discharge:  I have spent 35 minutes face to face with the patient and on the ward discussing the patients care, assessment, plan and disposition with other care givers. >50% of the time was devoted counseling the patient about the risks and benefits of treatment/Discharge disposition and coordinating care.   SIGNED:   Dimple Nanas, MD  [facility] 02/18/2023, 11:43 AM   If 7PM-7AM, please contact night-coverage

## 2023-02-18 NOTE — TOC Transition Note (Signed)
Transition of Care [facility]) - CM/SW Discharge Note   Patient Details  Name: Michelle [name] MRN: [id number] Date of Birth: 1933-03-03  [facility] CM/SW Contact:  Deatra Robinson, [place] Phone Number: 02/18/2023, 12:24 PM   Clinical Narrative:  Pt for dc to [facility] today. Spoke to [place] in [facility] admissions who confirmed they are prepared to admit pt to room 706. Pt's son at bedside and agreeable to dc. RN provided with number for report and [facility] arranged for transport. SW signing off at dc.   Dellie Burns, MSW, LCSW [phone] (coverage)       Final next level of care: Skilled Nursing Facility Barriers to Discharge: No Barriers Identified   Patient Goals and CMS Choice   Choice offered to / list presented to : Adult Children  Discharge Placement                Patient chooses bed at: [facility] Patient to be transferred to facility by: [facility] Name of family member notified: Michelle [name] Patient and family notified of of transfer: 02/18/23  Discharge Plan and Services Additional resources added to the After Visit Summary for                                       Social Determinants of Health (SDOH) Interventions SDOH Screenings   Food Insecurity: No Food Insecurity (02/07/2023)  Housing: Patient Declined (02/07/2023)  Transportation Needs: No Transportation Needs (02/07/2023)  Utilities: Not At Risk (02/07/2023)  Depression (PHQ2-9): Medium Risk (08/19/2022)  Financial Resource Strain: Low Risk  (06/20/2022)  Physical Activity: Inactive (06/20/2022)  Social Connections: Socially Isolated (06/20/2022)  Stress: No Stress Concern Present (06/20/2022)  Tobacco Use: Medium Risk (02/07/2023)     Readmission Risk Interventions    09/01/2020   12:30 PM  Readmission Risk Prevention Plan  Transportation Screening Complete  PCP or Specialist Appt within 3-5 Days Complete  HRI or Home Care Consult Complete  Social Work Consult for Recovery  Care Planning/Counseling Complete  Palliative Care Screening Not Applicable  Medication Review Ocean[employer]) Complete

## 2023-02-18 NOTE — Progress Notes (Signed)
Staples removed, 6 in number, patient tolerated well.

## 2023-02-20 DIAGNOSIS — M6281 Muscle weakness (generalized): Secondary | ICD-10-CM | POA: Diagnosis not present

## 2023-02-20 DIAGNOSIS — D509 Iron deficiency anemia, unspecified: Secondary | ICD-10-CM | POA: Diagnosis not present

## 2023-02-20 DIAGNOSIS — I110 Hypertensive heart disease with heart failure: Secondary | ICD-10-CM | POA: Diagnosis not present

## 2023-02-20 DIAGNOSIS — I480 Paroxysmal atrial fibrillation: Secondary | ICD-10-CM | POA: Diagnosis not present

## 2023-02-20 DIAGNOSIS — R338 Other retention of urine: Secondary | ICD-10-CM | POA: Diagnosis not present

## 2023-02-20 DIAGNOSIS — I5022 Chronic systolic (congestive) heart failure: Secondary | ICD-10-CM | POA: Diagnosis not present

## 2023-02-20 DIAGNOSIS — G4089 Other seizures: Secondary | ICD-10-CM | POA: Diagnosis not present

## 2023-02-20 DIAGNOSIS — J441 Chronic obstructive pulmonary disease with (acute) exacerbation: Secondary | ICD-10-CM | POA: Diagnosis not present

## 2023-02-20 DIAGNOSIS — J9601 Acute respiratory failure with hypoxia: Secondary | ICD-10-CM | POA: Diagnosis not present

## 2023-02-21 DIAGNOSIS — R2681 Unsteadiness on feet: Secondary | ICD-10-CM | POA: Diagnosis not present

## 2023-02-21 DIAGNOSIS — J9601 Acute respiratory failure with hypoxia: Secondary | ICD-10-CM | POA: Diagnosis not present

## 2023-02-21 DIAGNOSIS — D649 Anemia, unspecified: Secondary | ICD-10-CM | POA: Diagnosis not present

## 2023-02-21 DIAGNOSIS — D509 Iron deficiency anemia, unspecified: Secondary | ICD-10-CM | POA: Diagnosis not present

## 2023-02-21 DIAGNOSIS — G4089 Other seizures: Secondary | ICD-10-CM | POA: Diagnosis not present

## 2023-02-21 DIAGNOSIS — R7989 Other specified abnormal findings of blood chemistry: Secondary | ICD-10-CM | POA: Diagnosis not present

## 2023-02-21 DIAGNOSIS — Z9181 History of falling: Secondary | ICD-10-CM | POA: Diagnosis not present

## 2023-02-21 DIAGNOSIS — J441 Chronic obstructive pulmonary disease with (acute) exacerbation: Secondary | ICD-10-CM | POA: Diagnosis not present

## 2023-02-21 DIAGNOSIS — I10 Essential (primary) hypertension: Secondary | ICD-10-CM | POA: Diagnosis not present

## 2023-02-21 DIAGNOSIS — I480 Paroxysmal atrial fibrillation: Secondary | ICD-10-CM | POA: Diagnosis not present

## 2023-02-21 DIAGNOSIS — I5033 Acute on chronic diastolic (congestive) heart failure: Secondary | ICD-10-CM | POA: Diagnosis not present

## 2023-02-21 DIAGNOSIS — I110 Hypertensive heart disease with heart failure: Secondary | ICD-10-CM | POA: Diagnosis not present

## 2023-02-21 DIAGNOSIS — M6281 Muscle weakness (generalized): Secondary | ICD-10-CM | POA: Diagnosis not present

## 2023-02-21 DIAGNOSIS — I5022 Chronic systolic (congestive) heart failure: Secondary | ICD-10-CM | POA: Diagnosis not present

## 2023-02-21 DIAGNOSIS — I509 Heart failure, unspecified: Secondary | ICD-10-CM | POA: Diagnosis not present

## 2023-02-21 DIAGNOSIS — R350 Frequency of micturition: Secondary | ICD-10-CM | POA: Diagnosis not present

## 2023-02-21 DIAGNOSIS — I5032 Chronic diastolic (congestive) heart failure: Secondary | ICD-10-CM | POA: Diagnosis not present

## 2023-02-21 DIAGNOSIS — J449 Chronic obstructive pulmonary disease, unspecified: Secondary | ICD-10-CM | POA: Diagnosis not present

## 2023-02-21 DIAGNOSIS — R338 Other retention of urine: Secondary | ICD-10-CM | POA: Diagnosis not present

## 2023-02-21 DIAGNOSIS — N399 Disorder of urinary system, unspecified: Secondary | ICD-10-CM | POA: Diagnosis not present

## 2023-02-22 DIAGNOSIS — I110 Hypertensive heart disease with heart failure: Secondary | ICD-10-CM | POA: Diagnosis not present

## 2023-02-22 DIAGNOSIS — J9601 Acute respiratory failure with hypoxia: Secondary | ICD-10-CM | POA: Diagnosis not present

## 2023-02-22 DIAGNOSIS — G4089 Other seizures: Secondary | ICD-10-CM | POA: Diagnosis not present

## 2023-02-22 DIAGNOSIS — D509 Iron deficiency anemia, unspecified: Secondary | ICD-10-CM | POA: Diagnosis not present

## 2023-02-22 DIAGNOSIS — R338 Other retention of urine: Secondary | ICD-10-CM | POA: Diagnosis not present

## 2023-02-22 DIAGNOSIS — R197 Diarrhea, unspecified: Secondary | ICD-10-CM | POA: Diagnosis not present

## 2023-02-22 DIAGNOSIS — I480 Paroxysmal atrial fibrillation: Secondary | ICD-10-CM | POA: Diagnosis not present

## 2023-02-22 DIAGNOSIS — I5022 Chronic systolic (congestive) heart failure: Secondary | ICD-10-CM | POA: Diagnosis not present

## 2023-02-22 DIAGNOSIS — M6281 Muscle weakness (generalized): Secondary | ICD-10-CM | POA: Diagnosis not present

## 2023-02-23 ENCOUNTER — Telehealth: Payer: Self-pay | Admitting: Neurology

## 2023-02-23 NOTE — Telephone Encounter (Signed)
[name] states that Patient is now is [name]

## 2023-02-23 NOTE — Telephone Encounter (Signed)
Noted, thanks!

## 2023-02-24 ENCOUNTER — Ambulatory Visit: Payer: Medicare HMO | Admitting: Neurology

## 2023-02-24 DIAGNOSIS — D509 Iron deficiency anemia, unspecified: Secondary | ICD-10-CM | POA: Diagnosis not present

## 2023-02-24 DIAGNOSIS — R2681 Unsteadiness on feet: Secondary | ICD-10-CM | POA: Diagnosis not present

## 2023-02-24 DIAGNOSIS — I110 Hypertensive heart disease with heart failure: Secondary | ICD-10-CM | POA: Diagnosis not present

## 2023-02-24 DIAGNOSIS — R197 Diarrhea, unspecified: Secondary | ICD-10-CM | POA: Diagnosis not present

## 2023-02-24 DIAGNOSIS — M6281 Muscle weakness (generalized): Secondary | ICD-10-CM | POA: Diagnosis not present

## 2023-02-24 DIAGNOSIS — I5022 Chronic systolic (congestive) heart failure: Secondary | ICD-10-CM | POA: Diagnosis not present

## 2023-02-24 DIAGNOSIS — Z9181 History of falling: Secondary | ICD-10-CM | POA: Diagnosis not present

## 2023-02-24 DIAGNOSIS — J9601 Acute respiratory failure with hypoxia: Secondary | ICD-10-CM | POA: Diagnosis not present

## 2023-02-24 DIAGNOSIS — I5033 Acute on chronic diastolic (congestive) heart failure: Secondary | ICD-10-CM | POA: Diagnosis not present

## 2023-02-24 DIAGNOSIS — I480 Paroxysmal atrial fibrillation: Secondary | ICD-10-CM | POA: Diagnosis not present

## 2023-02-24 DIAGNOSIS — R338 Other retention of urine: Secondary | ICD-10-CM | POA: Diagnosis not present

## 2023-02-24 DIAGNOSIS — J449 Chronic obstructive pulmonary disease, unspecified: Secondary | ICD-10-CM | POA: Diagnosis not present

## 2023-02-24 DIAGNOSIS — G4089 Other seizures: Secondary | ICD-10-CM | POA: Diagnosis not present

## 2023-02-24 DIAGNOSIS — I5032 Chronic diastolic (congestive) heart failure: Secondary | ICD-10-CM | POA: Diagnosis not present

## 2023-02-27 DIAGNOSIS — J441 Chronic obstructive pulmonary disease with (acute) exacerbation: Secondary | ICD-10-CM | POA: Diagnosis not present

## 2023-02-27 DIAGNOSIS — D509 Iron deficiency anemia, unspecified: Secondary | ICD-10-CM | POA: Diagnosis not present

## 2023-02-27 DIAGNOSIS — I5022 Chronic systolic (congestive) heart failure: Secondary | ICD-10-CM | POA: Diagnosis not present

## 2023-02-27 DIAGNOSIS — J9601 Acute respiratory failure with hypoxia: Secondary | ICD-10-CM | POA: Diagnosis not present

## 2023-02-27 DIAGNOSIS — M6281 Muscle weakness (generalized): Secondary | ICD-10-CM | POA: Diagnosis not present

## 2023-02-27 DIAGNOSIS — I110 Hypertensive heart disease with heart failure: Secondary | ICD-10-CM | POA: Diagnosis not present

## 2023-02-27 DIAGNOSIS — R338 Other retention of urine: Secondary | ICD-10-CM | POA: Diagnosis not present

## 2023-02-27 DIAGNOSIS — I480 Paroxysmal atrial fibrillation: Secondary | ICD-10-CM | POA: Diagnosis not present

## 2023-02-27 DIAGNOSIS — G4089 Other seizures: Secondary | ICD-10-CM | POA: Diagnosis not present

## 2023-02-28 DIAGNOSIS — J441 Chronic obstructive pulmonary disease with (acute) exacerbation: Secondary | ICD-10-CM | POA: Diagnosis not present

## 2023-02-28 DIAGNOSIS — J9601 Acute respiratory failure with hypoxia: Secondary | ICD-10-CM | POA: Diagnosis not present

## 2023-02-28 DIAGNOSIS — D509 Iron deficiency anemia, unspecified: Secondary | ICD-10-CM | POA: Diagnosis not present

## 2023-02-28 DIAGNOSIS — I480 Paroxysmal atrial fibrillation: Secondary | ICD-10-CM | POA: Diagnosis not present

## 2023-02-28 DIAGNOSIS — I5022 Chronic systolic (congestive) heart failure: Secondary | ICD-10-CM | POA: Diagnosis not present

## 2023-02-28 DIAGNOSIS — G4089 Other seizures: Secondary | ICD-10-CM | POA: Diagnosis not present

## 2023-02-28 DIAGNOSIS — R338 Other retention of urine: Secondary | ICD-10-CM | POA: Diagnosis not present

## 2023-02-28 DIAGNOSIS — M6281 Muscle weakness (generalized): Secondary | ICD-10-CM | POA: Diagnosis not present

## 2023-02-28 DIAGNOSIS — I110 Hypertensive heart disease with heart failure: Secondary | ICD-10-CM | POA: Diagnosis not present

## 2023-03-01 ENCOUNTER — Emergency Department (HOSPITAL_COMMUNITY): Payer: Medicare HMO

## 2023-03-01 ENCOUNTER — Other Ambulatory Visit: Payer: Self-pay

## 2023-03-01 ENCOUNTER — Emergency Department (HOSPITAL_COMMUNITY)
Admission: EM | Admit: 2023-03-01 | Discharge: 2023-03-01 | Disposition: A | Discharge status: Home / Self Care | Payer: Medicare HMO | Attending: Emergency Medicine | Admitting: Emergency Medicine

## 2023-03-01 ENCOUNTER — Encounter (HOSPITAL_COMMUNITY): Payer: Self-pay

## 2023-03-01 DIAGNOSIS — Z7982 Long term (current) use of aspirin: Secondary | ICD-10-CM | POA: Insufficient documentation

## 2023-03-01 DIAGNOSIS — I5032 Chronic diastolic (congestive) heart failure: Secondary | ICD-10-CM | POA: Diagnosis not present

## 2023-03-01 DIAGNOSIS — Z9181 History of falling: Secondary | ICD-10-CM | POA: Diagnosis not present

## 2023-03-01 DIAGNOSIS — M6281 Muscle weakness (generalized): Secondary | ICD-10-CM | POA: Diagnosis not present

## 2023-03-01 DIAGNOSIS — I5033 Acute on chronic diastolic (congestive) heart failure: Secondary | ICD-10-CM | POA: Diagnosis not present

## 2023-03-01 DIAGNOSIS — F0394 Unspecified dementia, unspecified severity, with anxiety: Secondary | ICD-10-CM | POA: Diagnosis not present

## 2023-03-01 DIAGNOSIS — Z7902 Long term (current) use of antithrombotics/antiplatelets: Secondary | ICD-10-CM | POA: Insufficient documentation

## 2023-03-01 DIAGNOSIS — S0181XA Laceration without foreign body of other part of head, initial encounter: Secondary | ICD-10-CM

## 2023-03-01 DIAGNOSIS — E1165 Type 2 diabetes mellitus with hyperglycemia: Secondary | ICD-10-CM | POA: Diagnosis not present

## 2023-03-01 DIAGNOSIS — S0101XA Laceration without foreign body of scalp, initial encounter: Secondary | ICD-10-CM | POA: Diagnosis not present

## 2023-03-01 DIAGNOSIS — W19XXXA Unspecified fall, initial encounter: Secondary | ICD-10-CM

## 2023-03-01 DIAGNOSIS — I480 Paroxysmal atrial fibrillation: Secondary | ICD-10-CM | POA: Diagnosis not present

## 2023-03-01 DIAGNOSIS — S0231XA Fracture of orbital floor, right side, initial encounter for closed fracture: Secondary | ICD-10-CM | POA: Diagnosis not present

## 2023-03-01 DIAGNOSIS — R220 Localized swelling, mass and lump, head: Secondary | ICD-10-CM | POA: Diagnosis not present

## 2023-03-01 DIAGNOSIS — R519 Headache, unspecified: Secondary | ICD-10-CM | POA: Diagnosis present

## 2023-03-01 DIAGNOSIS — J449 Chronic obstructive pulmonary disease, unspecified: Secondary | ICD-10-CM | POA: Diagnosis not present

## 2023-03-01 DIAGNOSIS — R2681 Unsteadiness on feet: Secondary | ICD-10-CM | POA: Diagnosis not present

## 2023-03-01 DIAGNOSIS — S01111A Laceration without foreign body of right eyelid and periocular area, initial encounter: Secondary | ICD-10-CM | POA: Diagnosis not present

## 2023-03-01 DIAGNOSIS — G4089 Other seizures: Secondary | ICD-10-CM | POA: Diagnosis not present

## 2023-03-01 MED ORDER — LIDOCAINE-EPINEPHRINE-TETRACAINE (LET) TOPICAL GEL
3.0000 mL | Freq: Once | TOPICAL | Status: AC
  Administered 2023-03-01: 3 mL via TOPICAL
  Filled 2023-03-01: qty 3

## 2023-03-01 NOTE — ED Triage Notes (Addendum)
Pt BIB as a level 2 fall on thinners. Pt does not remember what happened, unwitness. Unknoiwn if mechanical fall or syncopal event. Pt has right eye laceration and hematoma. Pt is on plavix.

## 2023-03-01 NOTE — ED Notes (Signed)
Patient transported to CT 

## 2023-03-01 NOTE — ED Provider Notes (Signed)
[facility] AT [facility] Provider Note   CSN: [id number] Arrival date & time: 03/01/23  4098     History  Chief Complaint  Patient presents with   Marletta Lor    KATHILEEN BERNINGER is a 89 y.o. female.  HPI Patient arrives via EMS as a level 2 trauma. Patient provides her own history, but does not recall exactly why she fell, though she states that she has had difficulty with walking, perhaps stumbled.  She presents after an unwitnessed fall, unclear circumstances, and she only recalls that she was walking.  She complains of pain in the right upper face, no vision changes, no weakness in any extremity. EMS reports no hemodynamic instability en route.    Home Medications Prior to Admission medications   Medication Sig Start Date End Date Taking? Authorizing Provider  acetaminophen (TYLENOL) 650 MG CR tablet Take 650 mg by mouth 3 (three) times daily.   Yes [provider]  albuterol (PROVENTIL) (2.5 MG/3ML) 0.083% nebulizer solution USE THREE MILLILITERS VIA NEBULIZATION BY MOUTH EVERY 6 HOURS AS NEEDED FOR WHEEZING OR SHORTNESS OF BREATH Patient taking differently: Take 2.5 mg by nebulization every 6 (six) hours as needed for wheezing or shortness of breath. 10/12/21  Yes Shelva Majestic, MD  amLODipine (NORVASC) 2.5 MG tablet Take 2.5 mg in the morning. If afternoon blood pressure >145/90 take second tablet of 2.5 Patient taking differently: Take 2.5 mg by mouth See admin instructions. Take 1 tablet (2.5mg ) once a day. If afternoon BP is > 145/90 take a second dose of 2.5mg . 06/29/22  Yes Shelva Majestic, MD  aspirin 81 MG chewable tablet Chew 81 mg by mouth daily.   Yes [provider]  calcium carbonate (OSCAL) 1500 (600 Ca) MG TABS tablet Take 600 mg of elemental calcium by mouth daily.   Yes [provider]  carvedilol (COREG) 12.5 MG tablet Take 1 tablet (12.5 mg total) by mouth 2 (two) times daily. 12/06/22  Yes Alver Sorrow, NP  cholecalciferol (VITAMIN D3) 25 MCG (1000 UNIT) tablet Take 1,000 Units by mouth daily with breakfast.   Yes [provider]  clopidogrel (PLAVIX) 75 MG tablet TAKE 1 TABLET EVERY DAY Patient taking differently: Take 75 mg by mouth daily. 11/23/22  Yes Shelva Majestic, MD  diclofenac Sodium (VOLTAREN) 1 % GEL APPLY 2 GRAMS TOPICALLY TO AFFECTED AREA FOUR TIMES A DAY 08/08/22  Yes Shelva Majestic, MD  docusate (COLACE) 50 MG/5ML liquid Take 10 mLs (100 mg total) by mouth 2 (two) times daily as needed for mild constipation. 02/18/23  Yes Amin, Ankit Chirag, MD  enalapril (VASOTEC) 20 MG tablet TAKE 1 TABLET TWICE DAILY Patient taking differently: Take 20 mg by mouth 2 (two) times daily. 11/07/22  Yes Shelva Majestic, MD  fenofibrate micronized (LOFIBRA) 134 MG capsule TAKE 1 CAPSULE EVERY DAY 12/27/22  Yes Shelva Majestic, MD  ferrous sulfate 325 (65 FE) MG tablet Twice a week Patient taking differently: Take 325 mg by mouth 2 (two) times a week. Monday and Friday 02/21/20  Yes Shelva Majestic, MD  fluticasone-salmeterol (ADVAIR) 250-50 MCG/ACT AEPB INHALE 1 PUFF TWICE DAILY 09/12/22  Yes Shelva Majestic, MD  folic acid (FOLVITE) 400 MCG tablet Take 400 mcg by mouth daily with breakfast.   Yes [provider]  furosemide (LASIX) 20 MG tablet TAKE 1 TABLET EVERY DAY Patient taking differently: Take 20 mg by mouth daily. 05/02/22  Yes Gillian Shields  S, NP  hydrALAZINE (APRESOLINE) 50 MG tablet Take 1 tablet (50 mg total) by mouth 3 (three) times daily. 09/06/22  Yes Chilton Si, MD  icosapent Ethyl (VASCEPA) 1 g capsule Take 2 capsules (2 g total) by mouth 2 (two) times daily. 12/06/22  Yes Alver Sorrow, NP  insulin glargine (LANTUS SOLOSTAR) 100 UNIT/ML Solostar Pen INJECT 17 TO 20 UNITS UNDER THE SKIN EVERY MORNING Patient taking differently: Inject 17 Units into the skin every morning. 12/08/22  Yes Shelva Majestic, MD  lamoTRIgine (LAMICTAL) 25 MG  tablet TAKE 1 TABLET TWICE DAILY 12/21/22  Yes Van Clines, MD  loperamide (IMODIUM A-D) 2 MG tablet Take 2 mg by mouth 2 (two) times daily as needed for diarrhea or loose stools.   Yes [provider]  loratadine (CLARITIN) 10 MG tablet Take 10 mg by mouth daily.   Yes [provider]  medroxyPROGESTERone (PROVERA) 10 MG tablet Take 1 tablet (10 mg total) by mouth daily. 09/12/22  Yes Cross, Melissa D, NP  Melatonin 5 MG CAPS Take 5 mg by mouth at bedtime.   Yes [provider]  metFORMIN (GLUCOPHAGE-XR) 500 MG 24 hr tablet Take 1 tablet (500 mg total) by mouth daily with breakfast. 12/20/22  Yes Shelva Majestic, MD  mirtazapine (REMERON) 7.5 MG tablet Take 1 tablet (7.5 mg total) by mouth at bedtime. 02/18/23  Yes Amin, Ankit Chirag, MD  nitroGLYCERIN (NITROSTAT) 0.4 MG SL tablet DISSOLVE 1 TAB UNDER TONGUE FOR CHEST PAIN - IF PAIN REMAINS AFTER 5 MIN, CALL 911 AND REPEAT DOSE. MAX 3 TABS IN 15 MINUTES Patient taking differently: Place 0.4 mg under the tongue every 5 (five) minutes as needed for chest pain. DISSOLVE 1 TAB UNDER TONGUE FOR CHEST PAIN - IF PAIN REMAINS AFTER 5 MIN, CALL 911 AND REPEAT DOSE. MAX 3 TABS IN 15 MINUTES 01/17/23  Yes Chilton Si, MD  nortriptyline (PAMELOR) 10 MG capsule TAKE 1 CAPSULE BY MOUTH AT BEDTIME AS NEEDED FOR PAIN PREVENTION Patient taking differently: Take 10 mg by mouth at bedtime as needed (pain prevention). 01/30/23  Yes Rodolph Bong, MD  pantoprazole (PROTONIX) 40 MG tablet TAKE 1 TABLET EVERY DAY 07/13/22  Yes Shelva Majestic, MD  polyethylene glycol (MIRALAX / GLYCOLAX) 17 g packet Take 17 g by mouth daily as needed for moderate constipation. 02/18/23  Yes Amin, Ankit Chirag, MD  psyllium (METAMUCIL) 58.6 % packet Take 1 packet by mouth daily.   Yes [provider]  rosuvastatin (CRESTOR) 40 MG tablet TAKE 1 TABLET EVERY DAY 08/08/22  Yes Shelva Majestic, MD  sertraline (ZOLOFT) 50 MG tablet TAKE 2 TABLETS AT  BEDTIME Patient taking differently: Take 100 mg by mouth at bedtime. 10/13/22  Yes Shelva Majestic, MD  SPIRIVA HANDIHALER 18 MCG inhalation capsule PLACE 1 CAPSULE INTO HANDIHALER AND INHALE THE CONTENTS DAILY Patient taking differently: Place 18 mcg into inhaler and inhale daily. 11/23/22  Yes Shelva Majestic, MD  spironolactone (ALDACTONE) 25 MG tablet TAKE 1 TABLET EVERY DAY 08/22/22  Yes Shelva Majestic, MD  vitamin B-12 (CYANOCOBALAMIN) 1000 MCG tablet Take 1,000 mcg by mouth daily with breakfast.   Yes [provider]  vitamin E 400 UNIT capsule Take 400 Units by mouth at bedtime.   Yes [provider]  albuterol (VENTOLIN HFA) 108 (90 Base) MCG/ACT inhaler INHALE 1 PUFF EVERY 4 HOURS AS NEEDED FOR WHEEZING, SHORTNESS OF BREATH (RESCUE INHALER IF ADVAIR NOT WORKING) Patient not taking:  Reported on 03/01/2023 10/13/22   Shelva Majestic, MD  Alcohol Swabs (DROPSAFE ALCOHOL PREP) 70 % PADS USE FOR TESTING THREE TIMES DAILY 11/07/22   Shelva Majestic, MD  blood glucose meter kit and supplies KIT Dispense based on patient and insurance preference. Use up to four times daily as directed. Dx:E11.9 04/01/21   Shelva Majestic, MD  Insulin Pen Needle (DROPLET PEN NEEDLES) 31G X 8 MM MISC USE AS DIRECTED WITH LANTUS SOLOSTAR PEN. Dx. E11.9 01/20/23   Shelva Majestic, MD  Lancets Misc. (ACCU-CHEK FASTCLIX LANCET) KIT 1 Device by Does not apply route. Use as directed for testing blood sugar Patient not taking: Reported on 01/20/2023    [provider]  Lancets Misc. (ACCU-CHEK FASTCLIX LANCET) KIT Use as directed to test blood sugar Patient not taking: Reported on 01/20/2023 11/13/20   Shelva Majestic, MD  TRUE METRIX BLOOD GLUCOSE TEST test strip TEST BLOOD SUGAR UP TO FOUR TIMES DAILY AS DIRECTED 06/29/22   Shelva Majestic, MD  TRUEplus Lancets 33G MISC TEST BLOOD SUGAR UP TO FOUR TIMES DAILY AS DIRECTED 06/29/22   Shelva Majestic, MD      Allergies    Codeine sulfate  and Morphine sulfate    Review of Systems   Review of Systems  All other systems reviewed and are negative.   Physical Exam Updated Vital Signs BP 136/65   Pulse 77   Temp (!) 97.3 F (36.3 C) (Temporal)   Resp (!) 23   Ht 5\' 5"  (1.651 m)   Wt 77.1 kg   SpO2 100%   BMI 28.29 kg/m  Physical Exam Vitals and nursing note reviewed.  Constitutional:      General: She is not in acute distress.    Appearance: She is well-developed.  HENT:     Head: Normocephalic.   Eyes:     Conjunctiva/sclera: Conjunctivae normal.  Cardiovascular:     Rate and Rhythm: Normal rate and regular rhythm.  Pulmonary:     Effort: Pulmonary effort is normal. No respiratory distress.     Breath sounds: Normal breath sounds. No stridor.  Abdominal:     General: There is no distension.  Musculoskeletal:     Cervical back: Neck supple. No spinous process tenderness or muscular tenderness.  Skin:    General: Skin is warm and dry.  Neurological:     General: No focal deficit present.     Mental Status: She is alert and oriented to person, place, and time.     Cranial Nerves: No cranial nerve deficit.     Motor: Atrophy present.     Comments: EOMI unremarkable, vision equal bilaterally, no complaints, speech is clear  Psychiatric:        Mood and Affect: Mood normal.     ED Results / Procedures / Treatments   Labs (all labs ordered are listed, but only abnormal results are displayed) Labs Reviewed - No data to display  EKG None  Radiology CT HEAD WO CONTRAST  Result Date: 03/01/2023 CLINICAL DATA:  Fall EXAM: CT HEAD WITHOUT CONTRAST CT MAXILLOFACIAL WITHOUT CONTRAST CT CERVICAL SPINE WITHOUT CONTRAST TECHNIQUE: Multidetector CT imaging of the head, cervical spine, and maxillofacial structures were performed using the standard protocol without intravenous contrast. Multiplanar CT image reconstructions of the cervical spine and maxillofacial structures were also generated. RADIATION DOSE  REDUCTION: This exam was performed according to the departmental dose-optimization program which includes automated exposure control, adjustment of the mA and/or kV  according to patient size and/or use of iterative reconstruction technique. COMPARISON:  CT head and cervical spine 02/06/2023 FINDINGS: CT HEAD FINDINGS Brain: There is no acute intracranial hemorrhage, extra-axial fluid collection, or acute infarct. Parenchymal volume is stable. The ventricles are stable in size. Gray-white differentiation is preserved. Patchy hypodensity in the supratentorial white matter reflecting underlying chronic small-vessel ischemic change is stable. The pituitary and suprasellar region are normal. There is no mass lesion. There is no mass effect or midline shift. Vascular: No hyperdense vessel or unexpected calcification. Skull: Normal. Negative for fracture or focal lesion. Other: There is a vertex scalp laceration/swelling. The mastoid air cells and middle ear cavities are clear. CT MAXILLOFACIAL FINDINGS Osseous: There is an acute mildly comminuted and depressed fracture of the right orbital floor involving the infraorbital nerve canal. There is some herniation of orbital fat and slight inferior displacement of the inferior rectus muscle towards the fracture (7-48). There is no other acute facial bone fracture. Temporomandibular alignment is maintained. There is no suspicious osseous lesion. Orbits: The globes are intact. There is no retrobulbar hematoma. There is right periorbital soft tissue swelling. The right orbital floor fracture is described above. Sinuses: There is layering blood in the right maxillary sinus. Soft tissues: Otherwise unremarkable. Other: There are numerous dental caries involving the remaining teeth. CT CERVICAL SPINE FINDINGS Alignment: There is slight reversal of the normal cervical curvature. There is 4 mm anterolisthesis of C6 on C7 with partial fusion across the disc space, unchanged. There is  no jumped or perched facet or other evidence of traumatic malalignment. Skull base and vertebrae: Skull base alignment is maintained. Vertebral body heights are preserved. There is no evidence of acute fracture. There is no suspicious osseous lesion. Soft tissues and spinal canal: No prevertebral fluid or swelling. No visible canal hematoma. Disc levels: There is complete obliteration of the disc space at C6-C7 and moderate to advanced disc degeneration at C4-C5 and C5-C6. There is overall mild to moderate multilevel facet arthropathy. There is at least moderate spinal canal stenosis at C6-C7. Findings are unchanged. Upper chest: Linear opacities in the right lung apex likely reflect scar. Other: None. IMPRESSION: 1. No acute intracranial hemorrhage or calvarial fracture. 2. Acute mildly comminuted and depressed fracture of the right orbital floor involving the infraorbital nerve canal. There is some herniation of orbital fat and slight inferior displacement of the inferior rectus muscle towards the fracture. Correlate with physical exam to assess for entrapment. 3. Vertex scalp laceration and swelling. 4. No acute fracture or traumatic malalignment of the cervical spine. Electronically Signed   By: Lesia Hausen M.D.   On: 03/01/2023 09:53   CT MAXILLOFACIAL WO CONTRAST  Result Date: 03/01/2023 CLINICAL DATA:  Fall EXAM: CT HEAD WITHOUT CONTRAST CT MAXILLOFACIAL WITHOUT CONTRAST CT CERVICAL SPINE WITHOUT CONTRAST TECHNIQUE: Multidetector CT imaging of the head, cervical spine, and maxillofacial structures were performed using the standard protocol without intravenous contrast. Multiplanar CT image reconstructions of the cervical spine and maxillofacial structures were also generated. RADIATION DOSE REDUCTION: This exam was performed according to the departmental dose-optimization program which includes automated exposure control, adjustment of the mA and/or kV according to patient size and/or use of iterative  reconstruction technique. COMPARISON:  CT head and cervical spine 02/06/2023 FINDINGS: CT HEAD FINDINGS Brain: There is no acute intracranial hemorrhage, extra-axial fluid collection, or acute infarct. Parenchymal volume is stable. The ventricles are stable in size. Gray-white differentiation is preserved. Patchy hypodensity in the supratentorial white matter reflecting underlying  chronic small-vessel ischemic change is stable. The pituitary and suprasellar region are normal. There is no mass lesion. There is no mass effect or midline shift. Vascular: No hyperdense vessel or unexpected calcification. Skull: Normal. Negative for fracture or focal lesion. Other: There is a vertex scalp laceration/swelling. The mastoid air cells and middle ear cavities are clear. CT MAXILLOFACIAL FINDINGS Osseous: There is an acute mildly comminuted and depressed fracture of the right orbital floor involving the infraorbital nerve canal. There is some herniation of orbital fat and slight inferior displacement of the inferior rectus muscle towards the fracture (7-48). There is no other acute facial bone fracture. Temporomandibular alignment is maintained. There is no suspicious osseous lesion. Orbits: The globes are intact. There is no retrobulbar hematoma. There is right periorbital soft tissue swelling. The right orbital floor fracture is described above. Sinuses: There is layering blood in the right maxillary sinus. Soft tissues: Otherwise unremarkable. Other: There are numerous dental caries involving the remaining teeth. CT CERVICAL SPINE FINDINGS Alignment: There is slight reversal of the normal cervical curvature. There is 4 mm anterolisthesis of C6 on C7 with partial fusion across the disc space, unchanged. There is no jumped or perched facet or other evidence of traumatic malalignment. Skull base and vertebrae: Skull base alignment is maintained. Vertebral body heights are preserved. There is no evidence of acute fracture.  There is no suspicious osseous lesion. Soft tissues and spinal canal: No prevertebral fluid or swelling. No visible canal hematoma. Disc levels: There is complete obliteration of the disc space at C6-C7 and moderate to advanced disc degeneration at C4-C5 and C5-C6. There is overall mild to moderate multilevel facet arthropathy. There is at least moderate spinal canal stenosis at C6-C7. Findings are unchanged. Upper chest: Linear opacities in the right lung apex likely reflect scar. Other: None. IMPRESSION: 1. No acute intracranial hemorrhage or calvarial fracture. 2. Acute mildly comminuted and depressed fracture of the right orbital floor involving the infraorbital nerve canal. There is some herniation of orbital fat and slight inferior displacement of the inferior rectus muscle towards the fracture. Correlate with physical exam to assess for entrapment. 3. Vertex scalp laceration and swelling. 4. No acute fracture or traumatic malalignment of the cervical spine. Electronically Signed   By: Lesia Hausen M.D.   On: 03/01/2023 09:53   CT CERVICAL SPINE WO CONTRAST  Result Date: 03/01/2023 CLINICAL DATA:  Fall EXAM: CT HEAD WITHOUT CONTRAST CT MAXILLOFACIAL WITHOUT CONTRAST CT CERVICAL SPINE WITHOUT CONTRAST TECHNIQUE: Multidetector CT imaging of the head, cervical spine, and maxillofacial structures were performed using the standard protocol without intravenous contrast. Multiplanar CT image reconstructions of the cervical spine and maxillofacial structures were also generated. RADIATION DOSE REDUCTION: This exam was performed according to the departmental dose-optimization program which includes automated exposure control, adjustment of the mA and/or kV according to patient size and/or use of iterative reconstruction technique. COMPARISON:  CT head and cervical spine 02/06/2023 FINDINGS: CT HEAD FINDINGS Brain: There is no acute intracranial hemorrhage, extra-axial fluid collection, or acute infarct. Parenchymal  volume is stable. The ventricles are stable in size. Gray-white differentiation is preserved. Patchy hypodensity in the supratentorial white matter reflecting underlying chronic small-vessel ischemic change is stable. The pituitary and suprasellar region are normal. There is no mass lesion. There is no mass effect or midline shift. Vascular: No hyperdense vessel or unexpected calcification. Skull: Normal. Negative for fracture or focal lesion. Other: There is a vertex scalp laceration/swelling. The mastoid air cells and middle ear cavities are  clear. CT MAXILLOFACIAL FINDINGS Osseous: There is an acute mildly comminuted and depressed fracture of the right orbital floor involving the infraorbital nerve canal. There is some herniation of orbital fat and slight inferior displacement of the inferior rectus muscle towards the fracture (7-48). There is no other acute facial bone fracture. Temporomandibular alignment is maintained. There is no suspicious osseous lesion. Orbits: The globes are intact. There is no retrobulbar hematoma. There is right periorbital soft tissue swelling. The right orbital floor fracture is described above. Sinuses: There is layering blood in the right maxillary sinus. Soft tissues: Otherwise unremarkable. Other: There are numerous dental caries involving the remaining teeth. CT CERVICAL SPINE FINDINGS Alignment: There is slight reversal of the normal cervical curvature. There is 4 mm anterolisthesis of C6 on C7 with partial fusion across the disc space, unchanged. There is no jumped or perched facet or other evidence of traumatic malalignment. Skull base and vertebrae: Skull base alignment is maintained. Vertebral body heights are preserved. There is no evidence of acute fracture. There is no suspicious osseous lesion. Soft tissues and spinal canal: No prevertebral fluid or swelling. No visible canal hematoma. Disc levels: There is complete obliteration of the disc space at C6-C7 and moderate to  advanced disc degeneration at C4-C5 and C5-C6. There is overall mild to moderate multilevel facet arthropathy. There is at least moderate spinal canal stenosis at C6-C7. Findings are unchanged. Upper chest: Linear opacities in the right lung apex likely reflect scar. Other: None. IMPRESSION: 1. No acute intracranial hemorrhage or calvarial fracture. 2. Acute mildly comminuted and depressed fracture of the right orbital floor involving the infraorbital nerve canal. There is some herniation of orbital fat and slight inferior displacement of the inferior rectus muscle towards the fracture. Correlate with physical exam to assess for entrapment. 3. Vertex scalp laceration and swelling. 4. No acute fracture or traumatic malalignment of the cervical spine. Electronically Signed   By: Lesia Hausen M.D.   On: 03/01/2023 09:53    Procedures Procedures    Medications Ordered in ED Medications  lidocaine-EPINEPHrine-tetracaine (LET) topical gel (3 mLs Topical Given 03/01/23 6962)    ED Course/ Medical Decision Making/ A&P                                 Medical Decision Making Elderly female presents via EMS after unwitnessed fall.  Patient is on Plavix, with concern for antiplatelet use, head trauma, with obvious wound differential including fracture, intracranial abnormality, considered.  CT scans ordered, topical numbing medication, irrigation started, patient will require wound closure after CT scans are available. Cardiac 75 sinus normal Pulse ox 100% room air normal   Amount and/or Complexity of Data Reviewed Independent Historian: EMS External Data Reviewed: notes.    Details: Prior fall with well-healing head wound noted Radiology: ordered and independent interpretation performed. Decision-making details documented in ED Course.  Risk Prescription drug management. Decision regarding hospitalization. Diagnosis or treatment significantly limited by social determinants of health.   12:15  PM Patient awake, alert.  Wound has been repaired by nursing staff.  She has no other complaints, states that she is ready to go.  She is accompanied by her son. I discussed case with her ENT colleague, Dr. Demetrius Charity ACE.  With preserved ability to elevate both eyes symmetrically, no evidence for entrapment of the extraocular muscles, though with notation of an orbital floor fracture, the patient will follow-up with him in the  clinic.  With otherwise reassuring findings, no intracranial hemorrhage, skull fracture, neck fracture, patient discharged in stable condition.        Final Clinical Impression(s) / ED Diagnoses Final diagnoses:  Fall, initial encounter  Facial laceration, initial encounter  Closed fracture of right orbital floor, initial encounter [facility])    Rx / DC Orders ED Discharge Orders     None         Gerhard Munch, MD 03/01/23 1215

## 2023-03-01 NOTE — Progress Notes (Signed)
Orthopedic Tech Progress Note Patient Details:  Michelle [name] [birth year] [id number]  Level 2 trauma   Patient ID: Michelle [name], female   DOB: 1933-02-17, 89 y.o.   MRN: [id number]  Michelle [name] 03/01/2023, 9:42 AM

## 2023-03-01 NOTE — ED Notes (Signed)
Dr. Arita Miss ENT called left a message 2nd call

## 2023-03-01 NOTE — Discharge Instructions (Signed)
Today's evaluation has been generally reassuring.  However, with your facial bone fracture in the orbit of the right eye it is importantly follow-up with our surgery colleague.  This may be done next week safely.  Your wound is been repaired with Dermabond.  This clinical adhesive material will fall off in about 1 week. Return here for concerning changes in your condition.

## 2023-03-01 NOTE — ED Notes (Signed)
RN called [facility], [facility] states they will pick up pt in an hour to hour in a half

## 2023-03-01 NOTE — ED Notes (Signed)
Pt has hx of dementia, pt is currently axox4 and able to answer questions appropriately.

## 2023-03-02 DIAGNOSIS — R296 Repeated falls: Secondary | ICD-10-CM | POA: Diagnosis not present

## 2023-03-03 NOTE — Telephone Encounter (Signed)
Patient scheduled for Wednesday, 8/21.

## 2023-03-06 ENCOUNTER — Telehealth: Payer: Self-pay | Admitting: Family Medicine

## 2023-03-06 DIAGNOSIS — D509 Iron deficiency anemia, unspecified: Secondary | ICD-10-CM | POA: Diagnosis not present

## 2023-03-06 DIAGNOSIS — R338 Other retention of urine: Secondary | ICD-10-CM | POA: Diagnosis not present

## 2023-03-06 DIAGNOSIS — I110 Hypertensive heart disease with heart failure: Secondary | ICD-10-CM | POA: Diagnosis not present

## 2023-03-06 DIAGNOSIS — F0394 Unspecified dementia, unspecified severity, with anxiety: Secondary | ICD-10-CM | POA: Diagnosis not present

## 2023-03-06 DIAGNOSIS — E1165 Type 2 diabetes mellitus with hyperglycemia: Secondary | ICD-10-CM | POA: Diagnosis not present

## 2023-03-06 DIAGNOSIS — J9601 Acute respiratory failure with hypoxia: Secondary | ICD-10-CM | POA: Diagnosis not present

## 2023-03-06 DIAGNOSIS — I480 Paroxysmal atrial fibrillation: Secondary | ICD-10-CM | POA: Diagnosis not present

## 2023-03-06 DIAGNOSIS — I5022 Chronic systolic (congestive) heart failure: Secondary | ICD-10-CM | POA: Diagnosis not present

## 2023-03-06 DIAGNOSIS — G4089 Other seizures: Secondary | ICD-10-CM | POA: Diagnosis not present

## 2023-03-06 NOTE — Telephone Encounter (Signed)
Spoke with son today-he updated me on her progress

## 2023-03-06 NOTE — Telephone Encounter (Signed)
[name] called wanting to give PCP an update on patient. States that although she has had a couple of falls, she is making progress. States he hopes one day soon they can come back to meet with PCP. Caller expressed gratitude for PCP.

## 2023-03-07 DIAGNOSIS — I5033 Acute on chronic diastolic (congestive) heart failure: Secondary | ICD-10-CM | POA: Diagnosis not present

## 2023-03-07 DIAGNOSIS — M6281 Muscle weakness (generalized): Secondary | ICD-10-CM | POA: Diagnosis not present

## 2023-03-07 DIAGNOSIS — R2681 Unsteadiness on feet: Secondary | ICD-10-CM | POA: Diagnosis not present

## 2023-03-07 DIAGNOSIS — G4089 Other seizures: Secondary | ICD-10-CM | POA: Diagnosis not present

## 2023-03-07 DIAGNOSIS — Z9181 History of falling: Secondary | ICD-10-CM | POA: Diagnosis not present

## 2023-03-07 DIAGNOSIS — I5032 Chronic diastolic (congestive) heart failure: Secondary | ICD-10-CM | POA: Diagnosis not present

## 2023-03-07 DIAGNOSIS — J449 Chronic obstructive pulmonary disease, unspecified: Secondary | ICD-10-CM | POA: Diagnosis not present

## 2023-03-08 ENCOUNTER — Ambulatory Visit: Payer: Medicare HMO | Admitting: Family Medicine

## 2023-03-08 ENCOUNTER — Other Ambulatory Visit: Payer: Self-pay

## 2023-03-08 ENCOUNTER — Telehealth: Payer: Self-pay

## 2023-03-08 VITALS — BP 136/70 | HR 70 | Ht 65.0 in

## 2023-03-08 DIAGNOSIS — G8929 Other chronic pain: Secondary | ICD-10-CM | POA: Diagnosis not present

## 2023-03-08 DIAGNOSIS — M25562 Pain in left knee: Secondary | ICD-10-CM

## 2023-03-08 DIAGNOSIS — M170 Bilateral primary osteoarthritis of knee: Secondary | ICD-10-CM | POA: Diagnosis not present

## 2023-03-08 DIAGNOSIS — M25561 Pain in right knee: Secondary | ICD-10-CM | POA: Diagnosis not present

## 2023-03-08 MED ORDER — TRIAMCINOLONE ACETONIDE 32 MG IX SRER
32.0000 mg | Freq: Once | INTRA_ARTICULAR | Status: AC
Start: 2023-03-08 — End: 2023-03-08
  Administered 2023-03-08: 32 mg via INTRA_ARTICULAR

## 2023-03-08 NOTE — Telephone Encounter (Signed)
Transition Care Management Follow-up Telephone Call Date of discharge and from where: [facility] 8/14 How have you been since you were released from the hospital? Doing ok and following up with Ortho care today   Any questions or concerns? No  Items Reviewed: Did the pt receive and understand the discharge instructions provided? Yes  Medications obtained and verified? Yes  Other? No  Any new allergies since your discharge? No  Dietary orders reviewed? No Do you have support at home? Yes  Pt is in a rehab facility   Follow up appointments reviewed:  PCP Hospital f/u appt confirmed? No  Scheduled to see  on  @ . Specialist Hospital f/u appt confirmed? Yes  Scheduled to see [place] on 8/21 @ . Are transportation arrangements needed? No  If their condition worsens, is the pt aware to call PCP or go to the Emergency Dept.? Yes Was the patient provided with contact information for the PCP's office or ED? Yes Was to pt encouraged to call back with questions or concerns? Yes

## 2023-03-08 NOTE — Patient Instructions (Addendum)
Thank you for coming in today.   You received an injection today. Seek immediate medical attention if the joint becomes red, extremely painful, or is oozing fluid.   I can arrange for a consultation about the Geniculate Artery Embilization.   I can repeat the Zilretta injection in 3 months which is the week before thanksgiving.   Let me know ahead of time.

## 2023-03-08 NOTE — Progress Notes (Signed)
Rubin Payor, PhD, LAT, ATC acting as a scribe for [name], MD.  Michelle [name] is a 89 y.o. female who presents to [facility] today for cont'd bilat knee pain. Pt was last seen by Dr. Denyse Amass on 11/14/22 and was given repeat bilat knee Zilretta injections. Last traditional steroid injections were on 08/01/22. Of note, pt has been seen in the ED twice recently, after suffering falls.  Today, pt reports her knees are really hurting her. She notes she is feeling better since the last fall. He son notes that she's been staying at a skilled nursing facility rehab center for about a month now.  Dx imaging: 02/06/23 L knee XR 04/04/22 R & L knee XR             07/13/14 R knee XR  Pertinent review of systems: No fevers or chills  Relevant historical information: Diabetes and dementia and epilepsy   Exam:  BP 136/70   Pulse 70   Ht 5\' 5"  (1.651 m)   SpO2 94%   BMI 28.29 kg/m  General: Well Developed, well nourished, and in no acute distress.   MSK: Contusion right face. Knees bilaterally decreased muscle bulk.  Tender palpation anterior medial knee.    Lab and Radiology Results   Zilretta injection bilateral knee Procedure: Real-time Ultrasound Guided Injection of right knee anterior medial approach Device: Philips Affiniti 50G Images permanently stored and available for review in PACS Verbal informed consent obtained.  Discussed risks and benefits of procedure. Warned about infection, hyperglycemia bleeding, damage to structures among others. Patient expresses understanding and agreement Time-out conducted.   Noted no overlying erythema, induration, or other signs of local infection.   Skin prepped in a sterile fashion.   Local anesthesia: Topical Ethyl chloride.   With sterile technique and under real time ultrasound guidance: Zilretta 32 mg injected into knee joint. Fluid seen entering the joint capsule.   Completed without difficulty    Advised to call if fevers/chills, erythema, induration, drainage, or persistent bleeding.   Images permanently stored and available for review in the ultrasound unit.  Impression: Technically successful ultrasound guided injection.   Procedure: Real-time Ultrasound Guided Injection of left knee anterior medial approach Device: Philips Affiniti 50G Images permanently stored and available for review in PACS Verbal informed consent obtained.  Discussed risks and benefits of procedure. Warned about infection, hyperglycemia bleeding, damage to structures among others. Patient expresses understanding and agreement Time-out conducted.   Noted no overlying erythema, induration, or other signs of local infection.   Skin prepped in a sterile fashion.   Local anesthesia: Topical Ethyl chloride.   With sterile technique and under real time ultrasound guidance: Zilretta 32 mg injected into knee joint. Fluid seen entering the joint capsule.   Completed without difficulty   Advised to call if fevers/chills, erythema, induration, drainage, or persistent bleeding.   Images permanently stored and available for review in the ultrasound unit.  Impression: Technically successful ultrasound guided injection.  Lot number: [id number] for both injections    Assessment and Plan: 89 y.o. female with bilateral knee pain due to exacerbation of chronic DJD.  She is not a good candidate for knee replacement.  Zilretta injections done about every 3 months have been pretty effective recently.  Will repeat these injections today.  If they stop working or stop lasting long enough she may be a good candidate to consider geniculate artery embolization which we could arrange with interventional radiology.  Otherwise check back in about 3 months.  As for her fall and injury she seems to be doing okay going to skilled nursing facility for rehab.  I think this is a good idea and she should finish out the course of rehab.   Hopefully less knee pain will allow her to ambulate more effectively.   PDMP not reviewed this encounter. Orders Placed This Encounter  Procedures   [place] LIMITED JOINT SPACE STRUCTURES LOW BILAT(NO LINKED CHARGES)    Order Specific Question:   Reason for Exam (SYMPTOM  OR DIAGNOSIS REQUIRED)    Answer:   bilateral knee pain    Order Specific Question:   Preferred imaging location?    Answer:   [facility] ordered this encounter  Medications   Triamcinolone Acetonide (ZILRETTA) intra-articular injection 32 mg   Triamcinolone Acetonide (ZILRETTA) intra-articular injection 32 mg     Discussed warning signs or symptoms. Please see discharge instructions. Patient expresses understanding.   The above documentation has been reviewed and is accurate and complete [name], M.D.

## 2023-03-10 DIAGNOSIS — Z9181 History of falling: Secondary | ICD-10-CM | POA: Diagnosis not present

## 2023-03-10 DIAGNOSIS — I5032 Chronic diastolic (congestive) heart failure: Secondary | ICD-10-CM | POA: Diagnosis not present

## 2023-03-10 DIAGNOSIS — J449 Chronic obstructive pulmonary disease, unspecified: Secondary | ICD-10-CM | POA: Diagnosis not present

## 2023-03-10 DIAGNOSIS — R2681 Unsteadiness on feet: Secondary | ICD-10-CM | POA: Diagnosis not present

## 2023-03-10 DIAGNOSIS — I5033 Acute on chronic diastolic (congestive) heart failure: Secondary | ICD-10-CM | POA: Diagnosis not present

## 2023-03-10 DIAGNOSIS — G4089 Other seizures: Secondary | ICD-10-CM | POA: Diagnosis not present

## 2023-03-10 DIAGNOSIS — M6281 Muscle weakness (generalized): Secondary | ICD-10-CM | POA: Diagnosis not present

## 2023-03-10 NOTE — Telephone Encounter (Signed)
Pt received BILAT Zilretta injections on 03/08/23.  Can consider repeat inj on or after 06/01/23.

## 2023-03-13 DIAGNOSIS — I480 Paroxysmal atrial fibrillation: Secondary | ICD-10-CM | POA: Diagnosis not present

## 2023-03-13 DIAGNOSIS — F0394 Unspecified dementia, unspecified severity, with anxiety: Secondary | ICD-10-CM | POA: Diagnosis not present

## 2023-03-13 DIAGNOSIS — R338 Other retention of urine: Secondary | ICD-10-CM | POA: Diagnosis not present

## 2023-03-13 DIAGNOSIS — E1165 Type 2 diabetes mellitus with hyperglycemia: Secondary | ICD-10-CM | POA: Diagnosis not present

## 2023-03-13 DIAGNOSIS — I110 Hypertensive heart disease with heart failure: Secondary | ICD-10-CM | POA: Diagnosis not present

## 2023-03-13 DIAGNOSIS — J9601 Acute respiratory failure with hypoxia: Secondary | ICD-10-CM | POA: Diagnosis not present

## 2023-03-13 DIAGNOSIS — I5022 Chronic systolic (congestive) heart failure: Secondary | ICD-10-CM | POA: Diagnosis not present

## 2023-03-13 DIAGNOSIS — D509 Iron deficiency anemia, unspecified: Secondary | ICD-10-CM | POA: Diagnosis not present

## 2023-03-13 DIAGNOSIS — G4089 Other seizures: Secondary | ICD-10-CM | POA: Diagnosis not present

## 2023-03-17 ENCOUNTER — Other Ambulatory Visit: Payer: Self-pay | Admitting: Family Medicine

## 2023-03-17 DIAGNOSIS — G4089 Other seizures: Secondary | ICD-10-CM | POA: Diagnosis not present

## 2023-03-17 DIAGNOSIS — F0394 Unspecified dementia, unspecified severity, with anxiety: Secondary | ICD-10-CM | POA: Diagnosis not present

## 2023-03-17 DIAGNOSIS — I5022 Chronic systolic (congestive) heart failure: Secondary | ICD-10-CM | POA: Diagnosis not present

## 2023-03-17 DIAGNOSIS — I480 Paroxysmal atrial fibrillation: Secondary | ICD-10-CM | POA: Diagnosis not present

## 2023-03-17 DIAGNOSIS — D509 Iron deficiency anemia, unspecified: Secondary | ICD-10-CM | POA: Diagnosis not present

## 2023-03-17 DIAGNOSIS — E1165 Type 2 diabetes mellitus with hyperglycemia: Secondary | ICD-10-CM | POA: Diagnosis not present

## 2023-03-17 DIAGNOSIS — R338 Other retention of urine: Secondary | ICD-10-CM | POA: Diagnosis not present

## 2023-03-17 DIAGNOSIS — I110 Hypertensive heart disease with heart failure: Secondary | ICD-10-CM | POA: Diagnosis not present

## 2023-03-17 DIAGNOSIS — J9601 Acute respiratory failure with hypoxia: Secondary | ICD-10-CM | POA: Diagnosis not present

## 2023-03-21 DIAGNOSIS — G9341 Metabolic encephalopathy: Secondary | ICD-10-CM | POA: Diagnosis not present

## 2023-03-21 DIAGNOSIS — I5023 Acute on chronic systolic (congestive) heart failure: Secondary | ICD-10-CM | POA: Diagnosis not present

## 2023-03-21 DIAGNOSIS — N390 Urinary tract infection, site not specified: Secondary | ICD-10-CM | POA: Diagnosis not present

## 2023-03-21 DIAGNOSIS — B9689 Other specified bacterial agents as the cause of diseases classified elsewhere: Secondary | ICD-10-CM | POA: Diagnosis not present

## 2023-03-21 DIAGNOSIS — I110 Hypertensive heart disease with heart failure: Secondary | ICD-10-CM | POA: Diagnosis not present

## 2023-03-21 DIAGNOSIS — F0284 Dementia in other diseases classified elsewhere, unspecified severity, with anxiety: Secondary | ICD-10-CM | POA: Diagnosis not present

## 2023-03-21 DIAGNOSIS — F32A Depression, unspecified: Secondary | ICD-10-CM | POA: Diagnosis not present

## 2023-03-21 DIAGNOSIS — G4089 Other seizures: Secondary | ICD-10-CM | POA: Diagnosis not present

## 2023-03-21 DIAGNOSIS — F0283 Dementia in other diseases classified elsewhere, unspecified severity, with mood disturbance: Secondary | ICD-10-CM | POA: Diagnosis not present

## 2023-03-22 ENCOUNTER — Other Ambulatory Visit: Payer: Self-pay

## 2023-03-22 ENCOUNTER — Other Ambulatory Visit: Payer: Self-pay | Admitting: Neurology

## 2023-03-22 ENCOUNTER — Telehealth: Payer: Self-pay | Admitting: Family Medicine

## 2023-03-22 DIAGNOSIS — F32A Depression, unspecified: Secondary | ICD-10-CM | POA: Diagnosis not present

## 2023-03-22 DIAGNOSIS — I5023 Acute on chronic systolic (congestive) heart failure: Secondary | ICD-10-CM | POA: Diagnosis not present

## 2023-03-22 DIAGNOSIS — N390 Urinary tract infection, site not specified: Secondary | ICD-10-CM | POA: Diagnosis not present

## 2023-03-22 DIAGNOSIS — I110 Hypertensive heart disease with heart failure: Secondary | ICD-10-CM | POA: Diagnosis not present

## 2023-03-22 DIAGNOSIS — F0284 Dementia in other diseases classified elsewhere, unspecified severity, with anxiety: Secondary | ICD-10-CM | POA: Diagnosis not present

## 2023-03-22 DIAGNOSIS — G9341 Metabolic encephalopathy: Secondary | ICD-10-CM | POA: Diagnosis not present

## 2023-03-22 DIAGNOSIS — G4089 Other seizures: Secondary | ICD-10-CM | POA: Diagnosis not present

## 2023-03-22 DIAGNOSIS — B9689 Other specified bacterial agents as the cause of diseases classified elsewhere: Secondary | ICD-10-CM | POA: Diagnosis not present

## 2023-03-22 DIAGNOSIS — F0283 Dementia in other diseases classified elsewhere, unspecified severity, with mood disturbance: Secondary | ICD-10-CM | POA: Diagnosis not present

## 2023-03-22 MED ORDER — DICLOFENAC SODIUM 1 % EX GEL: 2.0000 g | Freq: Four times a day (QID) | CUTANEOUS | 3 refills | Status: DC

## 2023-03-22 NOTE — Telephone Encounter (Signed)
Home Health Verbal Orders  Agency:  [facility]  Caller: Enrique Sack  Reason for call:  BP has been trending low - low 80/between 30 and 40. Maybe alter meds??    [phone]

## 2023-03-22 NOTE — Telephone Encounter (Signed)
I called and spoke with Enrique Sack and she states that patients blood pressure today has been running 80/30, 76/43 and 84/43. Pt reports 2 days last week it running 86/46 and 86/4. Enrique Sack would like for you to get with pt cardiologist to discuss pt BP meds and come up with a solution and pt is still having chronic diarrhea.

## 2023-03-22 NOTE — Telephone Encounter (Signed)
Left message for patient to call back  

## 2023-03-22 NOTE — Telephone Encounter (Signed)
If sudden onset hypotension agree with plan for office evaluation. If more diarrhea, could contribute to hypotension and BMP/CBC could be performed in clinic to evaluate further.  Interestingly discharge summary from 01/2023 hospitalization says 'stop Amlodipine and Carvedilol' due to hypotension but then appears they were continued on discharge med list.  Agree to hold Amlodipine, Enalapril tonight. Given hypotension would hold Hydralazine tonight as well.  Recommend she call PCP or [place] in the morning with her BP reading prior to her medication to determine which to take/not take.  Will include my nursing team: Rutherford Guys can you please... Update her with medication recommendations for this evening. Call to offer her my ADV HTN slot tomorrow 03/23/23 at 3:35 PM? Would need to be in office visit.   Alver Sorrow, NP

## 2023-03-22 NOTE — Telephone Encounter (Signed)
2nd call attempt to patient, reviewed instructions with son. He will call tomorrow with updated BP, patient scheduled for tomorrow with Gillian Shields, NP

## 2023-03-22 NOTE — Telephone Encounter (Signed)
My first recommendation for blood pressure running that low would be in office evaluation- appears to have visit 03/27/23 with Dr. Ruthine Dose but id be open  if we have openings to see her sooner. She could also be seen at another [place] office if needed. If she feels poorly at such as lightheaded/dizzy/chest pain/ shortness of breath - would recommend Emergency Department visit (I am not in office this afternoon to see her)   My second thought is to look back at her medications- from my last note "medication: Furosemide 20 mg, hydralazine 50 mg 3 times a day (per cardiology can hold midday dose if blood pressure less than 110), amlodipine 2.5 mg, carvedilol 12.5 mg twice daily, enalapril 20 mg twice daily, spironolactone 25 mg daily "  She should certainly be holding the midday dose in this case as per Caitlin's last note. I would also recommending holding the amlodipine and the evening enalapril today and updating me tomorrow with blood pressure. Depending on pressures tomorrow and diarrhea issues we may have to hold lasix and spironolactone short term as well.

## 2023-03-23 ENCOUNTER — Encounter (HOSPITAL_BASED_OUTPATIENT_CLINIC_OR_DEPARTMENT_OTHER): Payer: Self-pay | Admitting: Family

## 2023-03-23 ENCOUNTER — Telehealth: Payer: Self-pay | Admitting: Cardiology

## 2023-03-23 ENCOUNTER — Ambulatory Visit (HOSPITAL_BASED_OUTPATIENT_CLINIC_OR_DEPARTMENT_OTHER): Payer: Medicare HMO | Admitting: Family

## 2023-03-23 VITALS — BP 113/66 | HR 73 | Ht 65.0 in | Wt 168.0 lb

## 2023-03-23 DIAGNOSIS — I959 Hypotension, unspecified: Secondary | ICD-10-CM | POA: Diagnosis not present

## 2023-03-23 DIAGNOSIS — R0989 Other specified symptoms and signs involving the circulatory and respiratory systems: Secondary | ICD-10-CM

## 2023-03-23 DIAGNOSIS — I5032 Chronic diastolic (congestive) heart failure: Secondary | ICD-10-CM

## 2023-03-23 DIAGNOSIS — E875 Hyperkalemia: Secondary | ICD-10-CM

## 2023-03-23 DIAGNOSIS — I25118 Atherosclerotic heart disease of native coronary artery with other forms of angina pectoris: Secondary | ICD-10-CM | POA: Diagnosis not present

## 2023-03-23 MED ORDER — ENALAPRIL MALEATE 20 MG PO TABS: 20.0000 mg | ORAL_TABLET | Freq: Every evening | ORAL | 3 refills | Status: DC

## 2023-03-23 MED ORDER — HYDRALAZINE HCL 25 MG PO TABS
ORAL_TABLET | ORAL | 2 refills | Status: DC
Start: 2023-03-23 — End: 2023-05-26

## 2023-03-23 MED ORDER — FUROSEMIDE 20 MG PO TABS
20.0000 mg | ORAL_TABLET | ORAL | 2 refills | Status: DC | PRN
Start: 2023-03-23 — End: 2023-09-16

## 2023-03-23 NOTE — Patient Instructions (Addendum)
Medication Instructions:   CHANGE Furosemide (Lasix) to AS NEEDED for swelling  STOP Metoprolol  STOP Spironolactone  CONTINUE Carvedilol 12.5mg  twice daily  CHANGE Enalapril to 20mg  every evening  Check BP prior to taking and if <110/60, hold dose  CHANGE Hydralazine to 25mg  AS NEEDED for systolic BP (top number) more than 160  CONTINUE Amlodipine 2.5mg  in the morning Check BP prior to taking and if <110/60, hold dose   Labwork: Your physician recommends that you return for lab work today: BMP, CBC    Follow-Up: In October in Hypertension Clinic with Dr. Duke Salvia or Alver Sorrow, NP

## 2023-03-23 NOTE — Telephone Encounter (Signed)
[facility] Called by [facility] re: critical lab K 6.0 Aldactone stopped, enalapril dose reduced today Other relevant labs Cr 0.91-->1.41 today Unable to reach pt by phone Will forward to ordering provider  Precious Reel, MD , [facility] 03/23/23 11:41 PM

## 2023-03-23 NOTE — Progress Notes (Signed)
Advanced Hypertension Clinic Assessment:    Date:  03/23/2023   ID:  Michelle [name], DOB 10/24/1932, MRN [id number]  PCP:  Shelva Majestic, MD  Cardiologist:  Verne Carrow, MD  Nephrologist:  Referring MD: Shelva Majestic, MD   CC: Hypertension  History of Present Illness:    Michelle [name] is a 89 y.o. female with a hx of CAD (angioplasty of RCA 1990, inferior STEMI 02/2014 RCA treated with 2 covered stents), DM 2, HTN, HLD, prior tobacco use, chronic diastolic heart failure.  Presents today for follow-up on hypertension clinic.   Established with advanced hypertension clinic February 2022.  Admitted 08/2020 with confusion BP 186/81, hypertensive encephalopathy.  Amlodipine, enalapril, spironolactone continued.  Metoprolol increased and hydralazine added.  Furosemide reduced due to concern for dehydration.  Cortisol normal limits.  Renal artery Dopplers 07/2020 unremarkable.  At visit 03/2022 due to labile blood pressure hydralazine adjusted to 50 mg a.m., 25 mg afternoon, 50 mg p.m.  Every 07/02/2022 she noted blood pressure was overall well-controlled at home as recommended continue same regimen.  Virtual visit with Dr. Duke Salvia 09/06/2022 with blood pressure continuing to be very labile but overall controlled.  She was instructed if blood pressure was running low to reduce hydralazine or amlodipine dose.  Urine catecholamines and metanephrines were ordered and unremarkable. At visit 12/06/22 her BP was well controlled on regimen of Amlodipine 2.5mg  QAM, Carvedilol 12.5mg  BID, Enalapril 20mg  BID, Lasix 20mg  QD, Hydralazine 50mg  TID, Spironolactone 25mg  QD.   Admitted 7/22-02/18/23 with seizure and fall with concern for trauma requiring intubation with acute metabolic encephalopathy. She had transient afib with RVR. Did have brief volume overload with echo with preserved LVEF. Inadvertent finding of 2mm lung nodule recommended for CT chest in 3 months. Discharged to [facility] and  returned home 03/17/23.   Presents today for follow up with her sons. Has had profound hypotension since return home from SNF with SBP as low as 70s associated with weakness. No dyspnea, chest pain, edema. Interestingly, hospital discharge summary states to stop Amlodipine, Coreg but they were continued on discharge.   Past Medical History:  Diagnosis Date   Anginal pain (HCC)    Arthritis    AV block, 1st degree    CAD (coronary artery disease) 1990; 2015   Cardiac cath 1990 with Dr. Riley Kill and [name] Residual CAD, non obstructive per 2015 cath. STEMI status post stent in August 2015.   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   Promus DES to RCA with STEMI   Shortness of breath     Past Surgical History:  Procedure Laterality Date   CATARACT EXTRACTION  2020   right and left eye    CHOLECYSTECTOMY     thinks her appendix was removed at the same time   CORONARY ANGIOPLASTY WITH STENT PLACEMENT  02/18/14   Promus DES to RCA   LEFT HEART CATHETERIZATION WITH CORONARY ANGIOGRAM N/A 02/18/2014   Procedure: LEFT HEART CATHETERIZATION WITH CORONARY ANGIOGRAM;  Surgeon:  Corky Crafts, MD;  Location: [facility] CATH LAB;  Service: Cardiovascular;  Laterality: N/A;   PTCA  1990   PTCA of RCA   TRANSTHORACIC ECHOCARDIOGRAM  02/02/2012   mild LVH, EF 55-60%, Normal WM, Gr 1 DD; Mild MR    Current Medications: Current Meds  Medication Sig   acetaminophen (TYLENOL) 650 MG CR tablet Take 650 mg by mouth 3 (three) times daily.    albuterol (PROVENTIL) (2.5 MG/3ML) 0.083% nebulizer solution USE THREE MILLILITERS VIA NEBULIZATION BY MOUTH EVERY 6 HOURS AS NEEDED FOR WHEEZING OR SHORTNESS OF BREATH (Patient taking differently: Take 2.5 mg by nebulization every 6 (six) hours as needed for wheezing or shortness of breath.)   albuterol (VENTOLIN HFA) 108 (90 Base) MCG/ACT inhaler INHALE 1 PUFF EVERY 4 HOURS AS NEEDED FOR WHEEZING, SHORTNESS OF BREATH (RESCUE INHALER IF ADVAIR NOT WORKING)   Alcohol Swabs (DROPSAFE ALCOHOL PREP) 70 % PADS USE FOR TESTING THREE TIMES DAILY   amLODipine (NORVASC) 2.5 MG tablet Take 2.5 mg in the morning. If afternoon blood pressure >145/90 take second tablet of 2.5 (Patient taking differently: Take 2.5 mg by mouth See admin instructions. Take 1 tablet (2.5mg ) once a day. If afternoon BP is > 145/90 take a second dose of 2.5mg .)   aspirin 81 MG chewable tablet Chew 81 mg by mouth daily.   blood glucose meter kit and supplies KIT Dispense based on patient and insurance preference. Use up to four times daily as directed. Dx:E11.9   calcium carbonate (OSCAL) 1500 (600 Ca) MG TABS tablet Take 600 mg of elemental calcium by mouth daily.   carvedilol (COREG) 12.5 MG tablet Take 1 tablet (12.5 mg total) by mouth 2 (two) times daily.   cholecalciferol (VITAMIN D3) 25 MCG (1000 UNIT) tablet Take 1,000 Units by mouth daily with breakfast.   clopidogrel (PLAVIX) 75 MG tablet TAKE 1 TABLET EVERY DAY (Patient taking differently: Take 75 mg by mouth daily.)   diclofenac Sodium (VOLTAREN) 1 % GEL Apply 2 g topically 4 (four) times daily.   docusate (COLACE) 50 MG/5ML liquid Take 10 mLs (100 mg total) by mouth 2 (two) times daily as needed for mild constipation.   fenofibrate micronized (LOFIBRA) 134 MG capsule TAKE 1 CAPSULE EVERY DAY   ferrous sulfate 325 (65 FE) MG tablet Twice a week (Patient taking differently: Take 325 mg by mouth 2 (two) times a week. Monday and Friday)   fluticasone-salmeterol (ADVAIR) 250-50  MCG/ACT AEPB INHALE 1 PUFF TWICE DAILY   folic acid (FOLVITE) 400 MCG tablet Take 400 mcg by mouth daily with breakfast.   icosapent Ethyl (VASCEPA) 1 g capsule Take 2 capsules (2 g total) by mouth 2 (two) times daily.   insulin glargine (LANTUS SOLOSTAR) 100 UNIT/ML Solostar Pen INJECT 17 TO 20 UNITS UNDER THE SKIN EVERY MORNING (Patient taking differently: Inject 17 Units into the skin every morning.)   Insulin Pen Needle (DROPLET PEN NEEDLES) 31G X 8 MM MISC USE AS DIRECTED WITH LANTUS SOLOSTAR PEN. Dx. E11.9   lamoTRIgine (LAMICTAL) 25 MG tablet TAKE 1 TABLET TWICE DAILY   Lancets Misc. (ACCU-CHEK FASTCLIX LANCET) KIT 1 Device by Does not apply route. Use as directed for testing blood sugar   Lancets Misc. (ACCU-CHEK FASTCLIX LANCET) KIT Use as directed to test blood sugar   loperamide (IMODIUM A-D) 2 MG tablet Take 2 mg by mouth 2 (two) times daily as needed for diarrhea or loose stools.   loratadine (CLARITIN) 10 MG tablet Take 10 mg by  mouth daily.   medroxyPROGESTERone (PROVERA) 10 MG tablet Take 1 tablet (10 mg total) by mouth daily.   Melatonin 5 MG CAPS Take 5 mg by mouth at bedtime.   mirtazapine (REMERON) 7.5 MG tablet Take 1 tablet (7.5 mg total) by mouth at bedtime.   nitroGLYCERIN (NITROSTAT) 0.4 MG SL tablet DISSOLVE 1 TAB UNDER TONGUE FOR CHEST PAIN - IF PAIN REMAINS AFTER 5 MIN, CALL 911 AND REPEAT DOSE. MAX 3 TABS IN 15 MINUTES (Patient taking differently: Place 0.4 mg under the tongue every 5 (five) minutes as needed for chest pain. DISSOLVE 1 TAB UNDER TONGUE FOR CHEST PAIN - IF PAIN REMAINS AFTER 5 MIN, CALL 911 AND REPEAT DOSE. MAX 3 TABS IN 15 MINUTES)   nortriptyline (PAMELOR) 10 MG capsule TAKE 1 CAPSULE BY MOUTH AT BEDTIME AS NEEDED FOR PAIN PREVENTION (Patient taking differently: Take 10 mg by mouth at bedtime as needed (pain prevention).)   pantoprazole (PROTONIX) 40 MG tablet TAKE 1 TABLET EVERY DAY   polyethylene glycol (MIRALAX / GLYCOLAX) 17 g packet Take 17 g by  mouth daily as needed for moderate constipation.   psyllium (METAMUCIL) 58.6 % packet Take 1 packet by mouth daily.   rosuvastatin (CRESTOR) 40 MG tablet TAKE 1 TABLET EVERY DAY   sertraline (ZOLOFT) 50 MG tablet TAKE 2 TABLETS AT BEDTIME (Patient taking differently: Take 100 mg by mouth at bedtime.)   SPIRIVA HANDIHALER 18 MCG inhalation capsule PLACE 1 CAPSULE INTO HANDIHALER AND INHALE THE CONTENTS DAILY (Patient taking differently: Place 18 mcg into inhaler and inhale daily.)   TRUE METRIX BLOOD GLUCOSE TEST test strip TEST BLOOD SUGAR UP TO FOUR TIMES DAILY AS DIRECTED   TRUEplus Lancets 33G MISC TEST BLOOD SUGAR UP TO FOUR TIMES DAILY AS DIRECTED   vitamin B-12 (CYANOCOBALAMIN) 1000 MCG tablet Take 1,000 mcg by mouth daily with breakfast.   vitamin E 400 UNIT capsule Take 400 Units by mouth at bedtime.   [DISCONTINUED] enalapril (VASOTEC) 20 MG tablet TAKE 1 TABLET TWICE DAILY (Patient taking differently: Take 20 mg by mouth 2 (two) times daily.)   [DISCONTINUED] furosemide (LASIX) 20 MG tablet TAKE 1 TABLET EVERY DAY (Patient taking differently: Take 20 mg by mouth daily.)   [DISCONTINUED] hydrALAZINE (APRESOLINE) 50 MG tablet Take 1 tablet (50 mg total) by mouth 3 (three) times daily.   [DISCONTINUED] spironolactone (ALDACTONE) 25 MG tablet TAKE 1 TABLET EVERY DAY     Allergies:   Codeine sulfate and Morphine sulfate   Social History   Socioeconomic History   Marital status: Single    Spouse name: Not on file   Number of children: 2   Years of education: Not on file   Highest education level: Not on file  Occupational History   Occupation: Retired    Associate Professor: RETIRED  Tobacco Use   Smoking status: Former    Current packs/day: 0.00    Average packs/day: 1 pack/day for 55.0 years (55.0 ttl pk-yrs)    Types: Cigarettes    Start date: 09/29/1951    Quit date: 09/29/2006    Years since quitting: 16.4   Smokeless tobacco: Never  Vaping Use   Vaping status: Never Used   Substance and Sexual Activity   Alcohol use: No   Drug use: No   Sexual activity: Not Currently  Other Topics Concern   Not on file  Social History Narrative   Lives with her son - 2 very supportive sons   Right handed   Retired  One story house with basement   Drinks caffeine   Social Determinants of Health   Financial Resource Strain: Low Risk  (06/20/2022)   Overall Financial Resource Strain (CARDIA)    Difficulty of Paying Living Expenses: Not hard at all  Food Insecurity: No Food Insecurity (02/07/2023)   Hunger Vital Sign    Worried About Running Out of Food in the Last Year: Never true    Ran Out of Food in the Last Year: Never true  Transportation Needs: No Transportation Needs (02/07/2023)   PRAPARE - Administrator, Civil Service (Medical): No    Lack of Transportation (Non-Medical): No  Physical Activity: Inactive (06/20/2022)   Exercise Vital Sign    Days of Exercise per Week: 0 days    Minutes of Exercise per Session: 0 min  Stress: No Stress Concern Present (06/20/2022)   [employer] of Occupational Health - Occupational Stress Questionnaire    Feeling of Stress : Not at all  Social Connections: Socially Isolated (06/20/2022)   Social Connection and Isolation Panel [NHANES]    Frequency of Communication with Friends and Family: Once a week    Frequency of Social Gatherings with Friends and Family: Once a week    Attends Religious Services: Never    Database administrator or Organizations: No    Attends Engineer, structural: Never    Marital Status: Never married     Family History: The patient's family history includes Bone cancer in her brother; Cancer in her maternal grandfather, paternal grandfather, and paternal grandmother; Cancer (age of onset: 81) in her father; Diabetes in her son and son; Heart attack in her son; Heart attack (age of onset: 71) in her mother; Hyperlipidemia in her son; Hypertension in her son; Liver cancer in  her brother. There is no history of Colon cancer, Ovarian cancer, or Uterine cancer.  ROS:   Please see the history of present illness.    All other systems reviewed and are negative.  EKGs/Labs/Other Studies Reviewed:    Cardiac Studies & Procedures       ECHOCARDIOGRAM  ECHOCARDIOGRAM COMPLETE 02/17/2023  Narrative ECHOCARDIOGRAM REPORT    Patient Name:   Michelle [name] Date of Exam: 02/17/2023 Medical Rec #:  [id number]       Height:       65.0 in Accession #:    [id number]      Weight:       183.0 lb Date of Birth:  1932/11/15       BSA:          1.905 m Patient Age:    89 years        BP:           128/65 mmHg Patient Gender: F               HR:           97 bpm. Exam Location:  Inpatient  Procedure: Color Doppler, Cardiac Doppler and 2D Echo  Indications:    I42.9 Cardiomyopathy (unspecified)  History:        Patient has prior history of Echocardiogram examinations, most recent 03/05/2014. CAD and Previous Myocardial Infarction, COPD; Risk Factors:Diabetes, Hypertension and Dyslipidemia.  Sonographer:    Harriette Bouillon RDCS Referring Phys: [phone] ANKIT CHIRAG AMIN  IMPRESSIONS   1. Left ventricular ejection fraction, by estimation, is 50 to 55%. The left ventricle has low normal function. The basal LV is hyperkinetic. There is hypokinesis of  the LV apex extending to the mid to basal anteroseptal wall. Limited visualization of endocardium. Left ventricular diastolic parameters are indeterminate. 2. Right ventricular systolic function is normal. The right ventricular size is normal. 3. The mitral valve is normal in structure. No evidence of mitral valve regurgitation. No evidence of mitral stenosis. 4. The aortic valve is normal in structure. There is mild calcification of the aortic valve. Aortic valve regurgitation is not visualized. No aortic stenosis is present. 5. The inferior vena cava is normal in size with greater than 50% respiratory variability, suggesting  right atrial pressure of 3 mmHg.  FINDINGS Left Ventricle: Left ventricular ejection fraction, by estimation, is 50 to 55%. The left ventricle has low normal function. The left ventricle demonstrates regional wall motion abnormalities. The left ventricular internal cavity size was normal in size. There is no left ventricular hypertrophy. Left ventricular diastolic parameters are indeterminate.   LV Wall Scoring: The mid and distal anterior septum, apical lateral segment, mid inferoseptal segment, and apex are hypokinetic.  Right Ventricle: The right ventricular size is normal. No increase in right ventricular wall thickness. Right ventricular systolic function is normal.  Left Atrium: Left atrial size was normal in size.  Right Atrium: Right atrial size was normal in size.  Pericardium: There is no evidence of pericardial effusion.  Mitral Valve: The mitral valve is normal in structure. No evidence of mitral valve regurgitation. No evidence of mitral valve stenosis.  Tricuspid Valve: The tricuspid valve is normal in structure. Tricuspid valve regurgitation is not demonstrated. No evidence of tricuspid stenosis.  Aortic Valve: The aortic valve is normal in structure. There is mild calcification of the aortic valve. Aortic valve regurgitation is not visualized. No aortic stenosis is present.  Pulmonic Valve: The pulmonic valve was normal in structure. Pulmonic valve regurgitation is not visualized. No evidence of pulmonic stenosis.  Aorta: The aortic root is normal in size and structure.  Venous: The inferior vena cava is normal in size with greater than 50% respiratory variability, suggesting right atrial pressure of 3 mmHg.  IAS/Shunts: No atrial level shunt detected by color flow Doppler.   LEFT VENTRICLE PLAX 2D LVIDd:         5.20 cm LVIDs:         3.40 cm LV PW:         1.00 cm LV IVS:        1.00 cm LVOT diam:     1.90 cm LVOT Area:     2.84 cm   LEFT ATRIUM            Index LA diam:      3.80 cm 1.99 cm/m LA Vol (A4C): 28.2 ml 14.80 ml/m  AORTA Ao Root diam: 2.90 cm Ao Asc diam:  2.90 cm  MITRAL VALVE MV Area (PHT): 4.52 cm     SHUNTS MV Decel Time: 168 msec     Systemic Diam: 1.90 cm MV E velocity: 99.10 cm/s MV A velocity: 104.00 cm/s MV E/A ratio:  0.95  Aditya Sabharwal Electronically signed by Dorthula Nettles Signature Date/Time: 02/17/2023/1:11:38 PM    Final             Renal doppler 07/29/20 Right: Normal size right kidney. RRV flow present. No evidence of right renal artery stenosis.  Left:  Normal size of left kidney. LRV flow present. No evidence of left renal artery stenosis.  EKG:  EKG is  ordered today.  The ekg ordered today demonstrates NSR 74 bpm  with poor R wave progression no acute ST/T wave changes.   Recent Labs: 02/07/2023: ALT 16 02/18/2023: B Natriuretic Peptide 670.9; BUN 24; Creatinine, Ser 0.91; Hemoglobin 10.0; Magnesium 2.2; Platelets 277; Potassium 3.8; Sodium 136   Recent Lipid Panel    Component Value Date/Time   CHOL 122 08/19/2022 0912   TRIG 290 (H) 02/16/2023 0820   TRIG 1693 (HH) 07/28/2006 1202   HDL 53.40 08/19/2022 0912   CHOLHDL 2 08/19/2022 0912   VLDL 36.6 08/19/2022 0912   LDLCALC 32 08/19/2022 0912   LDLCALC 42 02/18/2020 1529   LDLDIRECT 44.0 07/08/2021 1612    Physical Exam:   VS:  BP 113/66   Pulse 73   Ht 5\' 5"  (1.651 m)   Wt 168 lb (76.2 kg)   BMI 27.96 kg/m  , BMI Body mass index is 27.96 kg/m. GENERAL:  Well appearing, overweight HEENT: Pupils equal round and reactive, fundi not visualized, oral mucosa unremarkable NECK:  No jugular venous distention, waveform within normal limits, carotid upstroke brisk and symmetric, no bruits, no thyromegaly LYMPHATICS:  No cervical adenopathy LUNGS:  Clear to auscultation bilaterally HEART:  RRR.  PMI not displaced or sustained,S1 and S2 within normal limits, no S3, no S4, no clicks, no rubs, no murmurs ABD:  Flat, positive bowel  sounds normal in frequency in pitch, no bruits, no rebound, no guarding, no midline pulsatile mass, no hepatomegaly, no splenomegaly EXT:  2 plus pulses throughout, no edema, no cyanosis no clubbing SKIN:  No rashes no nodules NEURO:  Cranial nerves II through XII grossly intact, motor grossly intact throughout PSYCH:  Cognitively intact, oriented to person place and time   ASSESSMENT/PLAN:    HTN - Previous hypertension now with profound hypotension. Update BMP, CBC to rule out dehydration, anemia as contributory. She does note blood in toilet of unclear duration but not certain if hematuria or melena.    Urine catecholamines, metanephrines unremarkable. Prior renal artery dopplers and renin-aldosterone ratios normal.  Medication changes due to hypotension: Change Furosemide to PRN. Stop Spironolactone. Reduce Enalapril from 20mg  BID to daily (hold dose if BP <110/60). Continue Amlodipine 2.5mg  QAM (hold if BP <110/60). Continue Coreg 12.5mg  BID. Change Hydralazine to PRN for SBP >160. Of note, was taking previously prescribed Metoprolol inadvertently which she will discontinue.   HFpEF - Euvolemic and well compensated on exam. Stop Spironolactone due to hypotension. Adjust Lasix to PRN for LE edema. Low sodium diet, fluid restriction <2L, and daily weights encouraged. Educated to contact our office for weight gain of 2 lbs overnight or 5 lbs in one week.   CAD / HLD - Stable with no anginal symptoms. No indication for ischemic evaluation.  Continue Rosuvastatin, Vascepa, Plavix, Coreg.   DM2 - Continue to follow with PCP.   OSA - Prior sleep study with OSA. However, would need updated sleep study to initiate CPAP and has previously declined.  Transient AFib - Noted during hospital stay in setting of acute illness. Was not started on OAC. Given recent hospitalization after fall, risks may outweigh benefit. Could consider monitor at follow up but not addressed at clinic today due to pertinence  of hypotension.   Screening for Secondary Hypertension:     Relevant Labs/Studies:    Latest Ref Rng & Units 02/18/2023    2:12 AM 02/17/2023    2:35 AM 02/16/2023    8:20 AM  Basic Labs  Sodium 135 - 145 mmol/L 136  139  137   Potassium 3.5 -  5.1 mmol/L 3.8  4.2  4.2   Creatinine 0.44 - 1.00 mg/dL 6.04  5.40  9.81        Latest Ref Rng & Units 08/12/2020    6:33 PM 06/24/2020   10:15 PM  Thyroid   TSH 0.350 - 4.500 uIU/mL 0.820  1.680        Latest Ref Rng & Units 08/18/2020    7:06 AM  Renin/Aldosterone   Aldosterone 0.0 - 30.0 ng/dL 4.1           Latest Ref Rng & Units 09/04/2020    9:55 AM  Cortisol  Cortisol  ug/dL 19.1        4/78/2956   10:16 AM  Renovascular   Renal Artery [place] Completed Yes      Disposition:    FU with MD/PharmD in 1 months    Medication Adjustments/Labs and Tests Ordered: Current medicines are reviewed at length with the patient today.  Concerns regarding medicines are outlined above.  Orders Placed This Encounter  Procedures   Basic metabolic panel   CBC   Meds ordered this encounter  Medications   furosemide (LASIX) 20 MG tablet    Sig: Take 1 tablet (20 mg total) by mouth as needed for fluid or edema.    Dispense:  90 tablet    Refill:  2    Order Specific Question:   Supervising Provider    Answer:   Jodelle Red [[id number]]   enalapril (VASOTEC) 20 MG tablet    Sig: Take 1 tablet (20 mg total) by mouth at bedtime.    Dispense:  90 tablet    Refill:  3    Order Specific Question:   Supervising Provider    Answer:   Jodelle Red [[id number]]   hydrALAZINE (APRESOLINE) 25 MG tablet    Sig: Take one tablet AS NEEDED for systolic BP more than 160. May take up to twice per day as needed.    Dispense:  30 tablet    Refill:  2    Order Specific Question:   Supervising Provider    Answer:   Jodelle Red [[id number]]     Signed, Alver Sorrow, NP  03/23/2023 10:37 PM    [facility]

## 2023-03-24 ENCOUNTER — Telehealth: Payer: Self-pay | Admitting: Family Medicine

## 2023-03-24 DIAGNOSIS — F0283 Dementia in other diseases classified elsewhere, unspecified severity, with mood disturbance: Secondary | ICD-10-CM | POA: Diagnosis not present

## 2023-03-24 DIAGNOSIS — F32A Depression, unspecified: Secondary | ICD-10-CM | POA: Diagnosis not present

## 2023-03-24 DIAGNOSIS — G4089 Other seizures: Secondary | ICD-10-CM | POA: Diagnosis not present

## 2023-03-24 DIAGNOSIS — B9689 Other specified bacterial agents as the cause of diseases classified elsewhere: Secondary | ICD-10-CM | POA: Diagnosis not present

## 2023-03-24 DIAGNOSIS — N390 Urinary tract infection, site not specified: Secondary | ICD-10-CM | POA: Diagnosis not present

## 2023-03-24 DIAGNOSIS — I110 Hypertensive heart disease with heart failure: Secondary | ICD-10-CM | POA: Diagnosis not present

## 2023-03-24 DIAGNOSIS — G9341 Metabolic encephalopathy: Secondary | ICD-10-CM | POA: Diagnosis not present

## 2023-03-24 DIAGNOSIS — I5023 Acute on chronic systolic (congestive) heart failure: Secondary | ICD-10-CM | POA: Diagnosis not present

## 2023-03-24 DIAGNOSIS — F0284 Dementia in other diseases classified elsewhere, unspecified severity, with anxiety: Secondary | ICD-10-CM | POA: Diagnosis not present

## 2023-03-24 MED ORDER — LOKELMA 10 G PO PACK: PACK | ORAL | Status: DC

## 2023-03-24 NOTE — Telephone Encounter (Signed)
Michelle Sorrow, NP  to Me     03/24/23  5:49 AM  Please see result note for recommendations (copied below)  CBC with stable mild anemia. BMP with decreased kidney function likely dehydration. Recommend increasing oral hydration. Elevated potassium (much higher than her baseline which could be related to kidney function or lab error).  Her Spironolactone was stopped and Enalapril already reduced at clinic visit which will help to lower potassium.  Out of caution, recommend one dose of Lokelma and repeat BMP Monday  Patient doing therapy and unavailable, advised [name]  Does have appointment at PCP Monday, if they can not obtain labs will come to [facility]

## 2023-03-24 NOTE — Telephone Encounter (Signed)
Called and spoke with Stephanine and VO given.

## 2023-03-24 NOTE — Addendum Note (Signed)
Addended by: Regis Bill B on: 03/24/2023 10:18 AM   Modules accepted: Orders

## 2023-03-24 NOTE — Telephone Encounter (Addendum)
Home Health Verbal Orders  Agency:  [facility]   Caller: Kendall Flack and title  Ph# [phone] -  OT Requesting OT and Social Worker Evaluation:    Reason for Request  OT:  Increase ability with ADL and ADL Transfers  Frequency:  1 x week for 4 weeks, 1 x per week every other week for 2 visits  AND   Social Worker Evaluation :  To get permanent Aide  HH needs F2F w/in last 30 days

## 2023-03-27 ENCOUNTER — Encounter: Payer: Self-pay | Admitting: Family Medicine

## 2023-03-27 ENCOUNTER — Telehealth: Payer: Self-pay | Admitting: Family Medicine

## 2023-03-27 ENCOUNTER — Ambulatory Visit (INDEPENDENT_AMBULATORY_CARE_PROVIDER_SITE_OTHER): Payer: Medicare HMO | Admitting: Family Medicine

## 2023-03-27 VITALS — BP 132/72 | HR 65 | Temp 97.6°F | Ht 65.0 in | Wt 167.2 lb

## 2023-03-27 DIAGNOSIS — N9489 Other specified conditions associated with female genital organs and menstrual cycle: Secondary | ICD-10-CM

## 2023-03-27 DIAGNOSIS — Z79899 Other long term (current) drug therapy: Secondary | ICD-10-CM

## 2023-03-27 DIAGNOSIS — R569 Unspecified convulsions: Secondary | ICD-10-CM

## 2023-03-27 DIAGNOSIS — I5032 Chronic diastolic (congestive) heart failure: Secondary | ICD-10-CM | POA: Diagnosis not present

## 2023-03-27 DIAGNOSIS — Z794 Long term (current) use of insulin: Secondary | ICD-10-CM

## 2023-03-27 DIAGNOSIS — I10 Essential (primary) hypertension: Secondary | ICD-10-CM

## 2023-03-27 DIAGNOSIS — S0101XD Laceration without foreign body of scalp, subsequent encounter: Secondary | ICD-10-CM | POA: Diagnosis not present

## 2023-03-27 DIAGNOSIS — E119 Type 2 diabetes mellitus without complications: Secondary | ICD-10-CM | POA: Diagnosis not present

## 2023-03-27 LAB — COMPREHENSIVE METABOLIC PANEL
ALT: 12 U/L (ref 0–35)
AST: 16 U/L (ref 0–37)
Albumin: 3.6 g/dL (ref 3.5–5.2)
Alkaline Phosphatase: 36 U/L — ABNORMAL LOW (ref 39–117)
BUN: 34 mg/dL — ABNORMAL HIGH (ref 6–23)
CO2: 24 meq/L (ref 19–32)
Calcium: 9.9 mg/dL (ref 8.4–10.5)
Chloride: 104 meq/L (ref 96–112)
Creatinine, Ser: 1.07 mg/dL (ref 0.40–1.20)
GFR: 46.47 mL/min — ABNORMAL LOW (ref >60.00)
Glucose, Bld: 97 mg/dL (ref 70–99)
Potassium: 4.5 meq/L (ref 3.5–5.1)
Sodium: 134 meq/L — ABNORMAL LOW (ref 135–145)
Total Bilirubin: 0.3 mg/dL (ref 0.2–1.2)
Total Protein: 6.9 g/dL (ref 6.0–8.3)

## 2023-03-27 NOTE — Patient Instructions (Signed)
Keep appt w/Dr. Durene Cal for Nov  If eye movement changes, let us know

## 2023-03-27 NOTE — Progress Notes (Signed)
Subjective:     Patient ID: Michelle [name], female    DOB: 1932/11/05, 89 y.o.   MRN: [id number]  Chief Complaint  Patient presents with   Follow-up    Hospital follow-up for a fall and repeat blood work requested from Cardiology Discuss medications     HPI Patient is accompanied by her 2 sons-David and [facility] f/u - Admitted 7/22-02/18/23 with seizure and fall with concern for trauma requiring intubation with acute metabolic encephalopathy. She had transient afib with RVR. Did have brief volume overload with echo with preserved LVEF. Inadvertent finding of 2mm lung nodule recommended for CT chest in 3 months. Discharged to [facility] and returned home 03/17/23. While in ECF-had 3 falls-1 which sent her to ER again on 03/01/23.  Seen by cardiology on 9/5. Bp's running low. Changed Furosemide to PRN, Stopped Spironolactone Reduced Enalapril to 20 mg daily. Hydralazine PRN. Amlodipine 2.5 mg. Elevated potassium and creatine. Needs repeat BMP. Will be visiting wound care on Thursday. Staples in head removed. Went to rehab for 4 weeks. Lives w/Randy but [name] helps take care of her as well.  Currently, they have home health coming out-physical therapy and nurse.  They are awaiting the social worker so they can get a home health aide hopefully permanently. While in the ECF, patient did not like the food they were giving her, so she did not eat much.  This is resulted in her 15 pound weight loss over the past month.  DM Type 2 - Stopped Metformin due to diarrhea concerns. Cheking sugars every day, they are running 100-120. Still on Lantus  Impaired Sleep - States last night was the first night since her return home at she did not sleep well. Endorses irritability and confusion the day after not receiving rest. Would like to continue using Melatonin 5 mg at bedtime. Was on mirtazipine in ECF, but sons prefer not to use.   Diarrhea - Endorses intermittent diarrhea for several months and  blood after using the bathroom. Do not know if the blood is vaginal or anal. Family has adjusted her diet and seen no improvements.  6.  Ovarian mass/thickened endometrium-sons aware and don't want to put her thru surgery/tx at her age.   There are no preventive care reminders to display for this patient.   Past Medical History:  Diagnosis Date   Anginal pain (HCC)    Arthritis    AV block, 1st degree    CAD (coronary artery disease) 1990; 2015   Cardiac cath 1990 with Dr. Riley Kill and [name] Residual CAD, non obstructive per 2015 cath. STEMI status post stent in August 2015.   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   Promus DES to RCA with STEMI   Shortness of breath     Past Surgical History:  Procedure Laterality Date   CATARACT EXTRACTION  2020   right and left eye    CHOLECYSTECTOMY     thinks her appendix was removed at the same time  CORONARY ANGIOPLASTY WITH STENT PLACEMENT  02/18/14   Promus DES to RCA   LEFT HEART CATHETERIZATION WITH CORONARY ANGIOGRAM N/A 02/18/2014   Procedure: LEFT HEART CATHETERIZATION WITH CORONARY ANGIOGRAM;  Surgeon: Corky Crafts, MD;  Location: [facility] CATH LAB;  Service: Cardiovascular;  Laterality: N/A;   PTCA  1990   PTCA of RCA   TRANSTHORACIC ECHOCARDIOGRAM  02/02/2012   mild LVH, EF 55-60%, Normal WM, Gr 1 DD; Mild MR     Current Outpatient Medications:    acetaminophen (TYLENOL) 650 MG CR tablet, Take 650 mg by mouth 3 (three) times daily., Disp: , Rfl:    albuterol  (PROVENTIL) (2.5 MG/3ML) 0.083% nebulizer solution, USE THREE MILLILITERS VIA NEBULIZATION BY MOUTH EVERY 6 HOURS AS NEEDED FOR WHEEZING OR SHORTNESS OF BREATH (Patient taking differently: Take 2.5 mg by nebulization every 6 (six) hours as needed for wheezing or shortness of breath.), Disp: 75 mL, Rfl: 3   albuterol (VENTOLIN HFA) 108 (90 Base) MCG/ACT inhaler, INHALE 1 PUFF EVERY 4 HOURS AS NEEDED FOR WHEEZING, SHORTNESS OF BREATH (RESCUE INHALER IF ADVAIR NOT WORKING), Disp: 2 each, Rfl: 2   Alcohol Swabs (DROPSAFE ALCOHOL PREP) 70 % PADS, USE FOR TESTING THREE TIMES DAILY, Disp: 300 each, Rfl: 3   amLODipine (NORVASC) 2.5 MG tablet, Take 2.5 mg in the morning. If afternoon blood pressure >145/90 take second tablet of 2.5 (Patient taking differently: Take 2.5 mg by mouth See admin instructions. Take 1 tablet (2.5mg ) once a day. If afternoon BP is > 145/90 take a second dose of 2.5mg .), Disp: 180 tablet, Rfl: 3   aspirin 81 MG chewable tablet, Chew 81 mg by mouth daily., Disp: , Rfl:    blood glucose meter kit and supplies KIT, Dispense based on patient and insurance preference. Use up to four times daily as directed. Dx:E11.9, Disp: 1 each, Rfl: 3   calcium carbonate (OSCAL) 1500 (600 Ca) MG TABS tablet, Take 600 mg of elemental calcium by mouth daily., Disp: , Rfl:    carvedilol (COREG) 12.5 MG tablet, Take 1 tablet (12.5 mg total) by mouth 2 (two) times daily., Disp: 180 tablet, Rfl: 3   cholecalciferol (VITAMIN D3) 25 MCG (1000 UNIT) tablet, Take 1,000 Units by mouth daily with breakfast., Disp: , Rfl:    clopidogrel (PLAVIX) 75 MG tablet, TAKE 1 TABLET EVERY DAY (Patient taking differently: Take 75 mg by mouth daily.), Disp: 90 tablet, Rfl: 3   diclofenac Sodium (VOLTAREN) 1 % GEL, Apply 2 g topically 4 (four) times daily., Disp: 300 g, Rfl: 3   enalapril (VASOTEC) 20 MG tablet, Take 1 tablet (20 mg total) by mouth at bedtime., Disp: 90 tablet, Rfl: 3   fenofibrate micronized (LOFIBRA) 134 MG  capsule, TAKE 1 CAPSULE EVERY DAY, Disp: 90 capsule, Rfl: 3   ferrous sulfate 325 (65 FE) MG tablet, Twice a week (Patient taking differently: Take 325 mg by mouth 2 (two) times a week. Monday and Friday), Disp: 18 tablet, Rfl: 3   fluticasone-salmeterol (ADVAIR) 250-50 MCG/ACT AEPB, INHALE 1 PUFF TWICE DAILY, Disp: 180 each, Rfl: 3   folic acid (FOLVITE) 400 MCG tablet, Take 400 mcg by mouth daily with breakfast., Disp: , Rfl:    furosemide (LASIX) 20 MG tablet, Take 1 tablet (20 mg total) by mouth as needed for fluid or edema., Disp: 90 tablet, Rfl: 2   hydrALAZINE (APRESOLINE) 25 MG tablet, Take one tablet AS NEEDED for systolic BP more than 160. May take up to twice per day as  needed., Disp: 30 tablet, Rfl: 2   icosapent Ethyl (VASCEPA) 1 g capsule, Take 2 capsules (2 g total) by mouth 2 (two) times daily., Disp: 360 capsule, Rfl: 3   insulin glargine (LANTUS SOLOSTAR) 100 UNIT/ML Solostar Pen, INJECT 17 TO 20 UNITS UNDER THE SKIN EVERY MORNING (Patient taking differently: Inject 17 Units into the skin every morning.), Disp: 30 mL, Rfl: 3   Insulin Pen Needle (DROPLET PEN NEEDLES) 31G X 8 MM MISC, USE AS DIRECTED WITH LANTUS SOLOSTAR PEN. Dx. E11.9, Disp: 100 each, Rfl: 4   lamoTRIgine (LAMICTAL) 25 MG tablet, TAKE 1 TABLET TWICE DAILY, Disp: 180 tablet, Rfl: 3   Lancets Misc. (ACCU-CHEK FASTCLIX LANCET) KIT, 1 Device by Does not apply route. Use as directed for testing blood sugar, Disp: , Rfl:    Lancets Misc. (ACCU-CHEK FASTCLIX LANCET) KIT, Use as directed to test blood sugar, Disp: 1 kit, Rfl: 1   loperamide (IMODIUM A-D) 2 MG tablet, Take 2 mg by mouth 2 (two) times daily as needed for diarrhea or loose stools., Disp: , Rfl:    loratadine (CLARITIN) 10 MG tablet, Take 10 mg by mouth daily., Disp: , Rfl:    medroxyPROGESTERone (PROVERA) 10 MG tablet, Take 1 tablet (10 mg total) by mouth daily., Disp: 30 tablet, Rfl: 3   Melatonin 5 MG CAPS, Take 5 mg by mouth at bedtime., Disp: , Rfl:     nitroGLYCERIN (NITROSTAT) 0.4 MG SL tablet, DISSOLVE 1 TAB UNDER TONGUE FOR CHEST PAIN - IF PAIN REMAINS AFTER 5 MIN, CALL 911 AND REPEAT DOSE. MAX 3 TABS IN 15 MINUTES (Patient taking differently: Place 0.4 mg under the tongue every 5 (five) minutes as needed for chest pain. DISSOLVE 1 TAB UNDER TONGUE FOR CHEST PAIN - IF PAIN REMAINS AFTER 5 MIN, CALL 911 AND REPEAT DOSE. MAX 3 TABS IN 15 MINUTES), Disp: 25 tablet, Rfl: 6   nortriptyline (PAMELOR) 10 MG capsule, TAKE 1 CAPSULE BY MOUTH AT BEDTIME AS NEEDED FOR PAIN PREVENTION (Patient taking differently: Take 10 mg by mouth at bedtime as needed (pain prevention).), Disp: 30 capsule, Rfl: 1   pantoprazole (PROTONIX) 40 MG tablet, TAKE 1 TABLET EVERY DAY, Disp: 90 tablet, Rfl: 3   polyethylene glycol (MIRALAX / GLYCOLAX) 17 g packet, Take 17 g by mouth daily as needed for moderate constipation., Disp: , Rfl:    psyllium (METAMUCIL) 58.6 % packet, Take 1 packet by mouth daily., Disp: , Rfl:    rosuvastatin (CRESTOR) 40 MG tablet, TAKE 1 TABLET EVERY DAY, Disp: 90 tablet, Rfl: 3   sertraline (ZOLOFT) 50 MG tablet, TAKE 2 TABLETS AT BEDTIME (Patient taking differently: Take 100 mg by mouth at bedtime.), Disp: 180 tablet, Rfl: 3   sodium zirconium cyclosilicate (LOKELMA) 10 g PACK packet, Take 1 dose only, Disp: , Rfl:    SPIRIVA HANDIHALER 18 MCG inhalation capsule, PLACE 1 CAPSULE INTO HANDIHALER AND INHALE THE CONTENTS DAILY (Patient taking differently: Place 18 mcg into inhaler and inhale daily.), Disp: 90 capsule, Rfl: 3   TRUE METRIX BLOOD GLUCOSE TEST test strip, TEST BLOOD SUGAR UP TO FOUR TIMES DAILY AS DIRECTED, Disp: 400 strip, Rfl: 3   TRUEplus Lancets 33G MISC, TEST BLOOD SUGAR UP TO FOUR TIMES DAILY AS DIRECTED, Disp: 400 each, Rfl: 3   vitamin B-12 (CYANOCOBALAMIN) 1000 MCG tablet, Take 1,000 mcg by mouth daily with breakfast., Disp: , Rfl:    vitamin E 400 UNIT capsule, Take 400 Units by mouth at bedtime., Disp: , Rfl:  Allergies   Allergen Reactions   Codeine Sulfate Nausea Only   Morphine Sulfate Nausea Only   ROS neg/noncontributory except as noted HPI/below      Objective:     BP 132/72   Pulse 65   Temp 97.6 F (36.4 C) (Temporal)   Ht 5\' 5"  (1.651 m)   Wt 167 lb 4 oz (75.9 kg)   SpO2 98%   BMI 27.83 kg/m  Wt Readings from Last 3 Encounters:  03/27/23 167 lb 4 oz (75.9 kg)  03/23/23 168 lb (76.2 kg)  03/01/23 170 lb (77.1 kg)    Physical Exam   Gen: WDWN NAD HEENT: NCAT, conjunctiva not injected, sclera nonicteric.  Old bruising.  EOMI NECK:  supple, no thyromegaly, no nodes, no carotid bruits CARDIAC: RRR, S1S2+, no murmur. DP 2+B LUNGS: CTAB. No wheezes ABDOMEN:  BS+, soft, NTND, No HSM, no masses but enlarged abd EXT:  no edema MSK: no gross abnormalities. +W/C NEURO: A&O x3.  CN II-XII intact.  PSYCH: normal mood. Good eye contact +healing laceration on the back of her head   Reviewed chart.  Discharge medications were reconciled and updated in patient's med list and reviewed with sons.  Spent 45 minutes with signs, patient, reviewing chart, reconciled medications, devising plan    Assessment & Plan:  Seizure (HCC)  Type 2 diabetes mellitus without complication, with long-term current use of insulin (HCC)  Chronic diastolic CHF (congestive heart failure) (HCC)  Essential hypertension  Adnexal mass  Laceration of scalp, subsequent encounter  High risk medication use -     Comprehensive metabolic panel  1.  Seizure-stable, patient hospitalized for seizure which led to a fall.  Patient was advised to get assistance with getting up as she does have a history of frequent falls.  Sons are taking very good care of her.  She does have some home health.  Long-term, will benefit from home health aide.  They will let us know if things fall through with the home health company so we can try this through [facility]. 2.  Laceration of scalp-status post seizure/fall.  It appears to be healing  well.  She does have an appointment with wound care later today. 3.  Type 2 diabetes-chronic.  Extremely well-controlled.  Last A1c was 5.5.  Patient is currently off of metformin.  Advised to stay off of it.  Sugars have been running 90-1 20s.  Continue Lantus.  If sugars start going too low, we will need to decrease dose. 4.  Hypertension-was overcorrected.  Saw cardiology.  Medications were adjusted.  Continue.  List was updated. 5.  Chronic diastolic CHF-stable.  On Lasix as needed.  Continue to monitor symptoms/weights 6.  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  Agree with son's decisions not to pursue surgery.  Will defer further discussions to their PCP.  She has an appointment in November. 7.  Abnormal labs/high risk meds-is recommended to get labs repeated today.  Will check CMP as potassium was high and creatinine was elevated. 8.  Social-currently both sons are active in her care and she is never left alone.  Home health is currently coming in.  I believe she would benefit from a permanent home health aide and take off some of the burden of the sons trying to do total care for her.  If they need help with the process (supposed to be working with the Child psychotherapist from home health) let us know.  Keep follow-up appointment with Dr. Durene Cal in November.  Patient is very high risk of falls.  Told patient to be patient when she calls for help getting up.  We do not want more falls.  Son is very concerned that future outcome may not be as favorable as this 1. No follow-ups on file.  Germaine Pomfret Rice,acting as a scribe for Angelena Sole, MD.,have documented all relevant documentation on the behalf of Angelena Sole, MD,as directed by  Angelena Sole, MD while in the presence of Angelena Sole, MD.  I, Angelena Sole, MD, have reviewed all documentation for this visit. The documentation on 03/27/23 for the exam, diagnosis, procedures, and orders are all accurate and complete.   Angelena Sole, MD

## 2023-03-27 NOTE — Telephone Encounter (Signed)
Received faxed document Home Health Certificate (Order ID [id number] ), to be filled out by provider. Patient requested to send it back via Fax. Document is located in providers tray at front office.Please advise

## 2023-03-27 NOTE — Progress Notes (Signed)
Labs have improved.  Continue same plan

## 2023-03-28 DIAGNOSIS — G9341 Metabolic encephalopathy: Secondary | ICD-10-CM | POA: Diagnosis not present

## 2023-03-28 DIAGNOSIS — G4089 Other seizures: Secondary | ICD-10-CM | POA: Diagnosis not present

## 2023-03-28 DIAGNOSIS — N390 Urinary tract infection, site not specified: Secondary | ICD-10-CM | POA: Diagnosis not present

## 2023-03-28 DIAGNOSIS — F0284 Dementia in other diseases classified elsewhere, unspecified severity, with anxiety: Secondary | ICD-10-CM | POA: Diagnosis not present

## 2023-03-28 DIAGNOSIS — F32A Depression, unspecified: Secondary | ICD-10-CM | POA: Diagnosis not present

## 2023-03-28 DIAGNOSIS — I110 Hypertensive heart disease with heart failure: Secondary | ICD-10-CM | POA: Diagnosis not present

## 2023-03-28 DIAGNOSIS — I5023 Acute on chronic systolic (congestive) heart failure: Secondary | ICD-10-CM | POA: Diagnosis not present

## 2023-03-28 DIAGNOSIS — F0283 Dementia in other diseases classified elsewhere, unspecified severity, with mood disturbance: Secondary | ICD-10-CM | POA: Diagnosis not present

## 2023-03-28 DIAGNOSIS — B9689 Other specified bacterial agents as the cause of diseases classified elsewhere: Secondary | ICD-10-CM | POA: Diagnosis not present

## 2023-03-29 ENCOUNTER — Other Ambulatory Visit: Payer: Self-pay | Admitting: Family Medicine

## 2023-03-29 ENCOUNTER — Telehealth: Payer: Self-pay | Admitting: Cardiovascular Disease

## 2023-03-29 DIAGNOSIS — N390 Urinary tract infection, site not specified: Secondary | ICD-10-CM | POA: Diagnosis not present

## 2023-03-29 DIAGNOSIS — I5023 Acute on chronic systolic (congestive) heart failure: Secondary | ICD-10-CM | POA: Diagnosis not present

## 2023-03-29 DIAGNOSIS — G9341 Metabolic encephalopathy: Secondary | ICD-10-CM | POA: Diagnosis not present

## 2023-03-29 DIAGNOSIS — I110 Hypertensive heart disease with heart failure: Secondary | ICD-10-CM | POA: Diagnosis not present

## 2023-03-29 DIAGNOSIS — F0283 Dementia in other diseases classified elsewhere, unspecified severity, with mood disturbance: Secondary | ICD-10-CM | POA: Diagnosis not present

## 2023-03-29 DIAGNOSIS — F0284 Dementia in other diseases classified elsewhere, unspecified severity, with anxiety: Secondary | ICD-10-CM | POA: Diagnosis not present

## 2023-03-29 DIAGNOSIS — G4089 Other seizures: Secondary | ICD-10-CM | POA: Diagnosis not present

## 2023-03-29 DIAGNOSIS — B9689 Other specified bacterial agents as the cause of diseases classified elsewhere: Secondary | ICD-10-CM | POA: Diagnosis not present

## 2023-03-29 DIAGNOSIS — F32A Depression, unspecified: Secondary | ICD-10-CM | POA: Diagnosis not present

## 2023-03-29 LAB — BASIC METABOLIC PANEL
BUN/Creatinine Ratio: 37 — ABNORMAL HIGH (ref 12–28)
BUN: 52 mg/dL — ABNORMAL HIGH (ref 8–27)
CO2: 18 mmol/L — ABNORMAL LOW (ref 20–29)
Calcium: 10 mg/dL (ref 8.7–10.3)
Chloride: 103 mmol/L (ref 96–106)
Creatinine, Ser: 1.41 mg/dL — ABNORMAL HIGH (ref 0.57–1.00)
Glucose: 118 mg/dL — ABNORMAL HIGH (ref 70–99)
Potassium: 6 mmol/L (ref 3.5–5.2)
Sodium: 134 mmol/L (ref 134–144)
eGFR: 36 mL/min/{1.73_m2} — ABNORMAL LOW (ref >59)

## 2023-03-29 LAB — CBC
Hematocrit: 34.4 % (ref 34.0–46.6)
Hemoglobin: 10.8 g/dL — ABNORMAL LOW (ref 11.1–15.9)
MCH: 27.1 pg (ref 26.6–33.0)
MCHC: 31.4 g/dL — ABNORMAL LOW (ref 31.5–35.7)
MCV: 86 fL (ref 79–97)
Platelets: 315 10*3/uL (ref 150–450)
RBC: 3.99 x10E6/uL (ref 3.77–5.28)
RDW: 12.8 % (ref 11.7–15.4)
WBC: 7.9 10*3/uL (ref 3.4–10.8)

## 2023-03-29 NOTE — Telephone Encounter (Signed)
Michelle [name] with [facility] is calling to report critical labs.  Phone# [phone] (opt#: 1)

## 2023-03-29 NOTE — Telephone Encounter (Signed)
Critical lab results K+ 6.0 from 9/5 Potassium rechecked 9/9 and result 4.5

## 2023-03-29 NOTE — Telephone Encounter (Signed)
Paperwork received and faxed back.

## 2023-03-30 DIAGNOSIS — G4089 Other seizures: Secondary | ICD-10-CM | POA: Diagnosis not present

## 2023-03-30 DIAGNOSIS — G9341 Metabolic encephalopathy: Secondary | ICD-10-CM | POA: Diagnosis not present

## 2023-03-30 DIAGNOSIS — I5023 Acute on chronic systolic (congestive) heart failure: Secondary | ICD-10-CM | POA: Diagnosis not present

## 2023-03-30 DIAGNOSIS — I5032 Chronic diastolic (congestive) heart failure: Secondary | ICD-10-CM | POA: Diagnosis not present

## 2023-03-30 DIAGNOSIS — F0284 Dementia in other diseases classified elsewhere, unspecified severity, with anxiety: Secondary | ICD-10-CM | POA: Diagnosis not present

## 2023-03-30 DIAGNOSIS — B9689 Other specified bacterial agents as the cause of diseases classified elsewhere: Secondary | ICD-10-CM | POA: Diagnosis not present

## 2023-03-30 DIAGNOSIS — I110 Hypertensive heart disease with heart failure: Secondary | ICD-10-CM | POA: Diagnosis not present

## 2023-03-30 DIAGNOSIS — N390 Urinary tract infection, site not specified: Secondary | ICD-10-CM | POA: Diagnosis not present

## 2023-03-30 DIAGNOSIS — F0283 Dementia in other diseases classified elsewhere, unspecified severity, with mood disturbance: Secondary | ICD-10-CM | POA: Diagnosis not present

## 2023-03-30 DIAGNOSIS — Z515 Encounter for palliative care: Secondary | ICD-10-CM | POA: Diagnosis not present

## 2023-03-30 DIAGNOSIS — S0101XD Laceration without foreign body of scalp, subsequent encounter: Secondary | ICD-10-CM | POA: Diagnosis not present

## 2023-03-30 DIAGNOSIS — F32A Depression, unspecified: Secondary | ICD-10-CM | POA: Diagnosis not present

## 2023-03-31 DIAGNOSIS — G9341 Metabolic encephalopathy: Secondary | ICD-10-CM | POA: Diagnosis not present

## 2023-03-31 DIAGNOSIS — B9689 Other specified bacterial agents as the cause of diseases classified elsewhere: Secondary | ICD-10-CM | POA: Diagnosis not present

## 2023-03-31 DIAGNOSIS — F0283 Dementia in other diseases classified elsewhere, unspecified severity, with mood disturbance: Secondary | ICD-10-CM | POA: Diagnosis not present

## 2023-03-31 DIAGNOSIS — I110 Hypertensive heart disease with heart failure: Secondary | ICD-10-CM | POA: Diagnosis not present

## 2023-03-31 DIAGNOSIS — F32A Depression, unspecified: Secondary | ICD-10-CM | POA: Diagnosis not present

## 2023-03-31 DIAGNOSIS — F0284 Dementia in other diseases classified elsewhere, unspecified severity, with anxiety: Secondary | ICD-10-CM | POA: Diagnosis not present

## 2023-03-31 DIAGNOSIS — G4089 Other seizures: Secondary | ICD-10-CM | POA: Diagnosis not present

## 2023-03-31 DIAGNOSIS — I5023 Acute on chronic systolic (congestive) heart failure: Secondary | ICD-10-CM | POA: Diagnosis not present

## 2023-03-31 DIAGNOSIS — N390 Urinary tract infection, site not specified: Secondary | ICD-10-CM | POA: Diagnosis not present

## 2023-04-03 DIAGNOSIS — I5023 Acute on chronic systolic (congestive) heart failure: Secondary | ICD-10-CM | POA: Diagnosis not present

## 2023-04-03 DIAGNOSIS — I110 Hypertensive heart disease with heart failure: Secondary | ICD-10-CM | POA: Diagnosis not present

## 2023-04-03 DIAGNOSIS — G9341 Metabolic encephalopathy: Secondary | ICD-10-CM | POA: Diagnosis not present

## 2023-04-03 DIAGNOSIS — B9689 Other specified bacterial agents as the cause of diseases classified elsewhere: Secondary | ICD-10-CM | POA: Diagnosis not present

## 2023-04-03 DIAGNOSIS — F32A Depression, unspecified: Secondary | ICD-10-CM | POA: Diagnosis not present

## 2023-04-03 DIAGNOSIS — F0284 Dementia in other diseases classified elsewhere, unspecified severity, with anxiety: Secondary | ICD-10-CM | POA: Diagnosis not present

## 2023-04-03 DIAGNOSIS — F0283 Dementia in other diseases classified elsewhere, unspecified severity, with mood disturbance: Secondary | ICD-10-CM | POA: Diagnosis not present

## 2023-04-03 DIAGNOSIS — G4089 Other seizures: Secondary | ICD-10-CM | POA: Diagnosis not present

## 2023-04-03 DIAGNOSIS — N390 Urinary tract infection, site not specified: Secondary | ICD-10-CM | POA: Diagnosis not present

## 2023-04-04 DIAGNOSIS — F32A Depression, unspecified: Secondary | ICD-10-CM | POA: Diagnosis not present

## 2023-04-04 DIAGNOSIS — I5023 Acute on chronic systolic (congestive) heart failure: Secondary | ICD-10-CM | POA: Diagnosis not present

## 2023-04-04 DIAGNOSIS — I110 Hypertensive heart disease with heart failure: Secondary | ICD-10-CM | POA: Diagnosis not present

## 2023-04-04 DIAGNOSIS — N390 Urinary tract infection, site not specified: Secondary | ICD-10-CM | POA: Diagnosis not present

## 2023-04-04 DIAGNOSIS — F0284 Dementia in other diseases classified elsewhere, unspecified severity, with anxiety: Secondary | ICD-10-CM | POA: Diagnosis not present

## 2023-04-04 DIAGNOSIS — B9689 Other specified bacterial agents as the cause of diseases classified elsewhere: Secondary | ICD-10-CM | POA: Diagnosis not present

## 2023-04-04 DIAGNOSIS — F0283 Dementia in other diseases classified elsewhere, unspecified severity, with mood disturbance: Secondary | ICD-10-CM | POA: Diagnosis not present

## 2023-04-04 DIAGNOSIS — G4089 Other seizures: Secondary | ICD-10-CM | POA: Diagnosis not present

## 2023-04-04 DIAGNOSIS — G9341 Metabolic encephalopathy: Secondary | ICD-10-CM | POA: Diagnosis not present

## 2023-04-04 NOTE — Telephone Encounter (Addendum)
Caller states Home Health Certificates for  Order ID [id number], Order [phone] and [phone] have not been received. Requests  Home Health Certificate be faxed to Fax# [phone]     Caller states is she needs to fax any of the above orders again, she requests to be called to be advised.

## 2023-04-05 DIAGNOSIS — I5023 Acute on chronic systolic (congestive) heart failure: Secondary | ICD-10-CM | POA: Diagnosis not present

## 2023-04-05 DIAGNOSIS — F32A Depression, unspecified: Secondary | ICD-10-CM | POA: Diagnosis not present

## 2023-04-05 DIAGNOSIS — I110 Hypertensive heart disease with heart failure: Secondary | ICD-10-CM | POA: Diagnosis not present

## 2023-04-05 DIAGNOSIS — G9341 Metabolic encephalopathy: Secondary | ICD-10-CM | POA: Diagnosis not present

## 2023-04-05 DIAGNOSIS — G4089 Other seizures: Secondary | ICD-10-CM | POA: Diagnosis not present

## 2023-04-05 DIAGNOSIS — F0283 Dementia in other diseases classified elsewhere, unspecified severity, with mood disturbance: Secondary | ICD-10-CM | POA: Diagnosis not present

## 2023-04-05 DIAGNOSIS — B9689 Other specified bacterial agents as the cause of diseases classified elsewhere: Secondary | ICD-10-CM | POA: Diagnosis not present

## 2023-04-05 DIAGNOSIS — F0284 Dementia in other diseases classified elsewhere, unspecified severity, with anxiety: Secondary | ICD-10-CM | POA: Diagnosis not present

## 2023-04-05 DIAGNOSIS — N390 Urinary tract infection, site not specified: Secondary | ICD-10-CM | POA: Diagnosis not present

## 2023-04-05 NOTE — Telephone Encounter (Signed)
Papers have been refaxed

## 2023-04-07 DIAGNOSIS — F0284 Dementia in other diseases classified elsewhere, unspecified severity, with anxiety: Secondary | ICD-10-CM | POA: Diagnosis not present

## 2023-04-07 DIAGNOSIS — G4089 Other seizures: Secondary | ICD-10-CM | POA: Diagnosis not present

## 2023-04-07 DIAGNOSIS — G9341 Metabolic encephalopathy: Secondary | ICD-10-CM | POA: Diagnosis not present

## 2023-04-07 DIAGNOSIS — F0283 Dementia in other diseases classified elsewhere, unspecified severity, with mood disturbance: Secondary | ICD-10-CM | POA: Diagnosis not present

## 2023-04-07 DIAGNOSIS — F32A Depression, unspecified: Secondary | ICD-10-CM | POA: Diagnosis not present

## 2023-04-07 DIAGNOSIS — I110 Hypertensive heart disease with heart failure: Secondary | ICD-10-CM | POA: Diagnosis not present

## 2023-04-07 DIAGNOSIS — I5023 Acute on chronic systolic (congestive) heart failure: Secondary | ICD-10-CM | POA: Diagnosis not present

## 2023-04-07 DIAGNOSIS — N390 Urinary tract infection, site not specified: Secondary | ICD-10-CM | POA: Diagnosis not present

## 2023-04-07 DIAGNOSIS — B9689 Other specified bacterial agents as the cause of diseases classified elsewhere: Secondary | ICD-10-CM | POA: Diagnosis not present

## 2023-04-11 ENCOUNTER — Telehealth: Payer: Self-pay | Admitting: Family Medicine

## 2023-04-11 DIAGNOSIS — F0283 Dementia in other diseases classified elsewhere, unspecified severity, with mood disturbance: Secondary | ICD-10-CM | POA: Diagnosis not present

## 2023-04-11 DIAGNOSIS — N390 Urinary tract infection, site not specified: Secondary | ICD-10-CM | POA: Diagnosis not present

## 2023-04-11 DIAGNOSIS — B9689 Other specified bacterial agents as the cause of diseases classified elsewhere: Secondary | ICD-10-CM | POA: Diagnosis not present

## 2023-04-11 DIAGNOSIS — F0284 Dementia in other diseases classified elsewhere, unspecified severity, with anxiety: Secondary | ICD-10-CM | POA: Diagnosis not present

## 2023-04-11 DIAGNOSIS — G9341 Metabolic encephalopathy: Secondary | ICD-10-CM | POA: Diagnosis not present

## 2023-04-11 DIAGNOSIS — I5023 Acute on chronic systolic (congestive) heart failure: Secondary | ICD-10-CM | POA: Diagnosis not present

## 2023-04-11 DIAGNOSIS — F32A Depression, unspecified: Secondary | ICD-10-CM | POA: Diagnosis not present

## 2023-04-11 DIAGNOSIS — G4089 Other seizures: Secondary | ICD-10-CM | POA: Diagnosis not present

## 2023-04-11 DIAGNOSIS — I110 Hypertensive heart disease with heart failure: Secondary | ICD-10-CM | POA: Diagnosis not present

## 2023-04-11 NOTE — Telephone Encounter (Signed)
Sonja with [facility] states [name] has reported Patient having more weakness and being lethargic over the weekend. Sonja's concern is Patient may have a UTI (had UTI just over a month ago while in rehab). Son reports strong ammonia smell in urine on 04/09/23.  Requests Order to collect urine.  Requests the following Verbal Orders:  Home Health Verbal Orders  Agency:  [facility]  Caller: Eugenio Hoes and title Ph# [phone] - RN  Requesting Skilled nursing:    Reason for Request:  See above  Frequency:   1 x per week for 5 weeks  HH needs F2F w/in last 30 days    AND  Home Health Verbal Orders  Agency:  [facility]  Caller: Eugenio Hoes and title Ph# [phone] - RN  Requesting Home Health Aide:    Reason for Request:  Assistance with Bathing-Patient unable to safely get in bathtub   Frequency:  2 x per week for 2 weeks, then 1 x per week for 2 weeks  HH needs F2F w/in last 30 days

## 2023-04-11 NOTE — Telephone Encounter (Signed)
She unfortunately needs an office visit for this or a visit from a doctor at facility. I will not be able to follow it up as out on paternity so needs a visit to help with coordination of this and to decide if empirically can be treated

## 2023-04-11 NOTE — Telephone Encounter (Signed)
See below

## 2023-04-12 ENCOUNTER — Other Ambulatory Visit: Payer: Self-pay | Admitting: Family Medicine

## 2023-04-12 ENCOUNTER — Encounter (HOSPITAL_BASED_OUTPATIENT_CLINIC_OR_DEPARTMENT_OTHER): Payer: Self-pay

## 2023-04-12 ENCOUNTER — Telehealth: Payer: Self-pay | Admitting: Cardiovascular Disease

## 2023-04-12 ENCOUNTER — Telehealth: Payer: Self-pay | Admitting: Family Medicine

## 2023-04-12 NOTE — Telephone Encounter (Signed)
Patient dropped off document Physician Order (Order ID [id number]), to be filled out by provider. Patient requested to send it back via Fax within 5-days. Document is located in providers tray at front office.Please advise at Mobile [phone] (mobile)

## 2023-04-12 NOTE — Telephone Encounter (Signed)
Take Furosemide 20mg  in the morning for the next two days then return to PRN. Avoid nighttime dosing as will cause nocturia. Ensure elevating legs, wearing compression stockings.    Alver Sorrow, NP

## 2023-04-12 NOTE — Telephone Encounter (Signed)
Last OV 03/08/23 Next OV not scheduled  Last refill 01/30/23  Qty #30/1

## 2023-04-12 NOTE — Telephone Encounter (Signed)
Please review and advise.

## 2023-04-12 NOTE — Telephone Encounter (Signed)
Please schedule ov for pt.

## 2023-04-12 NOTE — Telephone Encounter (Signed)
PT. Also sent mychart message, will respond in that encounter.

## 2023-04-12 NOTE — Telephone Encounter (Signed)
Patient also called in phone encounter, see information below copied from phone encounter.    Pt was pulled off of this medication by PCP office  and is dealing with dehydration issues. Pt's son would like a callback regarding if she should start this medication again or not due to her having some swelling yesterday and once he gave her one the swelling went down on her legs and feet. He'd like to discuss further once called back. Please advise

## 2023-04-12 NOTE — Telephone Encounter (Signed)
Pt c/o medication issue:  1. Name of Medication: furosemide (LASIX) 20 MG tablet   2. How are you currently taking this medication (dosage and times per day)?  Take 1 tablet (20 mg total) by mouth as needed for fluid or edema.       3. Are you having a reaction (difficulty breathing--STAT)? No  4. What is your medication issue? Pt was pulled off of this medication by PCP office  and is dealing with dehydration issues. Pt's son would like a callback regarding if she should start this medication again or not due to her having some swelling yesterday and once he gave her one the swelling went down on her legs and feet. He'd like to discuss further once called back. Please advise

## 2023-04-12 NOTE — Telephone Encounter (Signed)
Patient scheduled for 04/13/23

## 2023-04-13 ENCOUNTER — Ambulatory Visit (INDEPENDENT_AMBULATORY_CARE_PROVIDER_SITE_OTHER): Payer: Medicare HMO | Admitting: Family Medicine

## 2023-04-13 ENCOUNTER — Other Ambulatory Visit: Payer: Self-pay | Admitting: Family Medicine

## 2023-04-13 ENCOUNTER — Encounter: Payer: Self-pay | Admitting: Family Medicine

## 2023-04-13 VITALS — BP 129/70 | HR 74 | Temp 98.0°F | Ht 65.0 in | Wt 168.4 lb

## 2023-04-13 DIAGNOSIS — G9341 Metabolic encephalopathy: Secondary | ICD-10-CM | POA: Diagnosis not present

## 2023-04-13 DIAGNOSIS — I110 Hypertensive heart disease with heart failure: Secondary | ICD-10-CM | POA: Diagnosis not present

## 2023-04-13 DIAGNOSIS — I10 Essential (primary) hypertension: Secondary | ICD-10-CM | POA: Diagnosis not present

## 2023-04-13 DIAGNOSIS — B9689 Other specified bacterial agents as the cause of diseases classified elsewhere: Secondary | ICD-10-CM | POA: Diagnosis not present

## 2023-04-13 DIAGNOSIS — N390 Urinary tract infection, site not specified: Secondary | ICD-10-CM

## 2023-04-13 DIAGNOSIS — G4089 Other seizures: Secondary | ICD-10-CM | POA: Diagnosis not present

## 2023-04-13 DIAGNOSIS — F32A Depression, unspecified: Secondary | ICD-10-CM | POA: Diagnosis not present

## 2023-04-13 DIAGNOSIS — I5023 Acute on chronic systolic (congestive) heart failure: Secondary | ICD-10-CM | POA: Diagnosis not present

## 2023-04-13 DIAGNOSIS — F0284 Dementia in other diseases classified elsewhere, unspecified severity, with anxiety: Secondary | ICD-10-CM | POA: Diagnosis not present

## 2023-04-13 DIAGNOSIS — F0283 Dementia in other diseases classified elsewhere, unspecified severity, with mood disturbance: Secondary | ICD-10-CM | POA: Diagnosis not present

## 2023-04-13 MED ORDER — NITROFURANTOIN MONOHYD MACRO 100 MG PO CAPS: 100.0000 mg | ORAL_CAPSULE | Freq: Two times a day (BID) | ORAL | 0 refills | Status: DC

## 2023-04-13 NOTE — Progress Notes (Signed)
   Michelle [name] is a 89 y.o. female who presents today for an office visit.  Assessment/Plan:  New/Acute Problems: Urinary tract infection Discussed equivocal nature of recent urine culture.  She is having vague systemic symptoms but no other red flag signs or symptoms.  Her UA with home health was negative.  We discussed empiric treatment versus watchful waiting.  Given upcoming weekend and other symptoms elected to proceed with empiric treatment for now.  We will start Macrobid 100 mg twice daily for 7 days.  Will repeat urine culture and hopefully get speciation with sensitivities on our culture.  We discussed importance of good hydration.  We discussed reasons to return to care and seek emergent care.  Follow-up as needed.  Chronic Problems Addressed Today: Essential hypertension Blood pressure at goal today on current regimen amlodipine, Coreg, enalapril, hydralazine, and spironolactone.  CHF Euvolemic today.  Lower leg swelling improving with Lasix per cardiology.  Discussed that this may increase her urinary frequency.    Subjective:  HPI:  Patient here with increasing weakness and lethargy over the last week or so.  Home health called his PCP earlier this week and they were concerned about a possible UTI.  They had sent in a requisition to get orders for home health to collect a UA and urine culture.  This was collected.  UA was notable for 1+ protein but negative for nitrites and WBCs.  Urine culture showed 50,000 - 100,000 colony forming units of mixed urogenital flora.  Denies any dysuria. No fevers or chills. No abdominal pain. She has been having decreased urinary frequency.  Urine has smelled strong like ammonia.  She has had a little more swelling in her legs but this is improved since starting on Lasix at the direction of her cardiologist.       Objective:  Physical Exam: BP 129/70   Pulse 74   Temp 98 F (36.7 C) (Temporal)   Ht 5\' 5"  (1.651 m)   Wt 168 lb 6.4 oz  (76.4 kg)   SpO2 99%   BMI 28.02 kg/m   Gen: No acute distress, resting comfortably in wheel chair Neuro: Grossly normal, moves all extremities Psych: Normal affect and thought content      Aeneas Longsworth M. Jimmey Ralph, MD 04/13/2023 2:57 PM

## 2023-04-13 NOTE — Patient Instructions (Signed)
It was very nice to see you today!  Please start the Macrobid.  We will check a urine culture today.  Let us know if your symptoms change.  Return if symptoms worsen or fail to improve.   Take care, Dr Jimmey Ralph  PLEASE NOTE:  If you had any lab tests, please let us know if you have not heard back within a few days. You may see your results on mychart before we have a chance to review them but we will give you a call once they are reviewed by [place].   If we ordered any referrals today, please let us know if you have not heard from their office within the next week.   If you had any urgent prescriptions sent in today, please check with the pharmacy within an hour of our visit to make sure the prescription was transmitted appropriately.   Please try these tips to maintain a healthy lifestyle:  Eat at least 3 REAL meals and 1-2 snacks per day.  Aim for no more than 5 hours between eating.  If you eat breakfast, please do so within one hour of getting up.   Each meal should contain half fruits/vegetables, one quarter protein, and one quarter carbs (no bigger than a computer mouse)  Cut down on sweet beverages. This includes juice, soda, and sweet tea.   Drink at least 1 glass of water with each meal and aim for at least 8 glasses per day  Exercise at least 150 minutes every week.

## 2023-04-13 NOTE — Addendum Note (Signed)
Addended by: Lorn Junes on: 04/13/2023 03:24 PM   Modules accepted: Orders

## 2023-04-15 LAB — URINE CULTURE

## 2023-04-17 ENCOUNTER — Other Ambulatory Visit: Payer: Self-pay | Admitting: Family Medicine

## 2023-04-17 DIAGNOSIS — F0283 Dementia in other diseases classified elsewhere, unspecified severity, with mood disturbance: Secondary | ICD-10-CM | POA: Diagnosis not present

## 2023-04-17 DIAGNOSIS — I110 Hypertensive heart disease with heart failure: Secondary | ICD-10-CM | POA: Diagnosis not present

## 2023-04-17 DIAGNOSIS — B9689 Other specified bacterial agents as the cause of diseases classified elsewhere: Secondary | ICD-10-CM | POA: Diagnosis not present

## 2023-04-17 DIAGNOSIS — N390 Urinary tract infection, site not specified: Secondary | ICD-10-CM | POA: Diagnosis not present

## 2023-04-17 DIAGNOSIS — I5023 Acute on chronic systolic (congestive) heart failure: Secondary | ICD-10-CM | POA: Diagnosis not present

## 2023-04-17 DIAGNOSIS — G9341 Metabolic encephalopathy: Secondary | ICD-10-CM | POA: Diagnosis not present

## 2023-04-17 DIAGNOSIS — F0284 Dementia in other diseases classified elsewhere, unspecified severity, with anxiety: Secondary | ICD-10-CM | POA: Diagnosis not present

## 2023-04-17 DIAGNOSIS — F32A Depression, unspecified: Secondary | ICD-10-CM | POA: Diagnosis not present

## 2023-04-17 DIAGNOSIS — G4089 Other seizures: Secondary | ICD-10-CM | POA: Diagnosis not present

## 2023-04-17 NOTE — Telephone Encounter (Signed)
Forms faxed and signed back.

## 2023-04-18 ENCOUNTER — Telehealth: Payer: Self-pay | Admitting: Family Medicine

## 2023-04-18 NOTE — Progress Notes (Signed)
Her urine culture does not show any signs of UTI.  They should follow-up with [place] or their PCP if symptoms are not improving or if she developed any new symptoms.

## 2023-04-18 NOTE — Telephone Encounter (Signed)
Received faxed document Home Health Certificate (Order ID [id number] ), to be filled out by provider. Patient requested to send it back via Fax. Document is located in providers tray at front office.Please advise

## 2023-04-19 DIAGNOSIS — G4089 Other seizures: Secondary | ICD-10-CM | POA: Diagnosis not present

## 2023-04-19 DIAGNOSIS — I110 Hypertensive heart disease with heart failure: Secondary | ICD-10-CM | POA: Diagnosis not present

## 2023-04-19 DIAGNOSIS — G9341 Metabolic encephalopathy: Secondary | ICD-10-CM | POA: Diagnosis not present

## 2023-04-19 DIAGNOSIS — F0283 Dementia in other diseases classified elsewhere, unspecified severity, with mood disturbance: Secondary | ICD-10-CM | POA: Diagnosis not present

## 2023-04-19 DIAGNOSIS — F0284 Dementia in other diseases classified elsewhere, unspecified severity, with anxiety: Secondary | ICD-10-CM | POA: Diagnosis not present

## 2023-04-19 DIAGNOSIS — F32A Depression, unspecified: Secondary | ICD-10-CM | POA: Diagnosis not present

## 2023-04-19 DIAGNOSIS — I5023 Acute on chronic systolic (congestive) heart failure: Secondary | ICD-10-CM | POA: Diagnosis not present

## 2023-04-19 DIAGNOSIS — B9689 Other specified bacterial agents as the cause of diseases classified elsewhere: Secondary | ICD-10-CM | POA: Diagnosis not present

## 2023-04-19 DIAGNOSIS — N390 Urinary tract infection, site not specified: Secondary | ICD-10-CM | POA: Diagnosis not present

## 2023-04-19 NOTE — Telephone Encounter (Signed)
Paperwork complete and faxed back

## 2023-04-21 DIAGNOSIS — N390 Urinary tract infection, site not specified: Secondary | ICD-10-CM | POA: Diagnosis not present

## 2023-04-21 DIAGNOSIS — F0284 Dementia in other diseases classified elsewhere, unspecified severity, with anxiety: Secondary | ICD-10-CM | POA: Diagnosis not present

## 2023-04-21 DIAGNOSIS — B9689 Other specified bacterial agents as the cause of diseases classified elsewhere: Secondary | ICD-10-CM | POA: Diagnosis not present

## 2023-04-21 DIAGNOSIS — I110 Hypertensive heart disease with heart failure: Secondary | ICD-10-CM | POA: Diagnosis not present

## 2023-04-21 DIAGNOSIS — G9341 Metabolic encephalopathy: Secondary | ICD-10-CM | POA: Diagnosis not present

## 2023-04-21 DIAGNOSIS — G4089 Other seizures: Secondary | ICD-10-CM | POA: Diagnosis not present

## 2023-04-21 DIAGNOSIS — F0283 Dementia in other diseases classified elsewhere, unspecified severity, with mood disturbance: Secondary | ICD-10-CM | POA: Diagnosis not present

## 2023-04-21 DIAGNOSIS — I5032 Chronic diastolic (congestive) heart failure: Secondary | ICD-10-CM | POA: Diagnosis not present

## 2023-04-21 DIAGNOSIS — F32A Depression, unspecified: Secondary | ICD-10-CM | POA: Diagnosis not present

## 2023-04-21 DIAGNOSIS — I5023 Acute on chronic systolic (congestive) heart failure: Secondary | ICD-10-CM | POA: Diagnosis not present

## 2023-04-21 DIAGNOSIS — Z515 Encounter for palliative care: Secondary | ICD-10-CM | POA: Diagnosis not present

## 2023-04-24 DIAGNOSIS — I110 Hypertensive heart disease with heart failure: Secondary | ICD-10-CM | POA: Diagnosis not present

## 2023-04-24 DIAGNOSIS — I5023 Acute on chronic systolic (congestive) heart failure: Secondary | ICD-10-CM | POA: Diagnosis not present

## 2023-04-24 DIAGNOSIS — F32A Depression, unspecified: Secondary | ICD-10-CM | POA: Diagnosis not present

## 2023-04-24 DIAGNOSIS — G4089 Other seizures: Secondary | ICD-10-CM | POA: Diagnosis not present

## 2023-04-24 DIAGNOSIS — G9341 Metabolic encephalopathy: Secondary | ICD-10-CM | POA: Diagnosis not present

## 2023-04-24 DIAGNOSIS — F0284 Dementia in other diseases classified elsewhere, unspecified severity, with anxiety: Secondary | ICD-10-CM | POA: Diagnosis not present

## 2023-04-24 DIAGNOSIS — N390 Urinary tract infection, site not specified: Secondary | ICD-10-CM | POA: Diagnosis not present

## 2023-04-24 DIAGNOSIS — B9689 Other specified bacterial agents as the cause of diseases classified elsewhere: Secondary | ICD-10-CM | POA: Diagnosis not present

## 2023-04-24 DIAGNOSIS — F0283 Dementia in other diseases classified elsewhere, unspecified severity, with mood disturbance: Secondary | ICD-10-CM | POA: Diagnosis not present

## 2023-04-27 ENCOUNTER — Telehealth: Payer: Self-pay | Admitting: Family Medicine

## 2023-04-27 DIAGNOSIS — G9341 Metabolic encephalopathy: Secondary | ICD-10-CM | POA: Diagnosis not present

## 2023-04-27 DIAGNOSIS — G4089 Other seizures: Secondary | ICD-10-CM | POA: Diagnosis not present

## 2023-04-27 DIAGNOSIS — I110 Hypertensive heart disease with heart failure: Secondary | ICD-10-CM | POA: Diagnosis not present

## 2023-04-27 DIAGNOSIS — N390 Urinary tract infection, site not specified: Secondary | ICD-10-CM | POA: Diagnosis not present

## 2023-04-27 DIAGNOSIS — F0284 Dementia in other diseases classified elsewhere, unspecified severity, with anxiety: Secondary | ICD-10-CM | POA: Diagnosis not present

## 2023-04-27 DIAGNOSIS — I5023 Acute on chronic systolic (congestive) heart failure: Secondary | ICD-10-CM | POA: Diagnosis not present

## 2023-04-27 DIAGNOSIS — B9689 Other specified bacterial agents as the cause of diseases classified elsewhere: Secondary | ICD-10-CM | POA: Diagnosis not present

## 2023-04-27 DIAGNOSIS — F32A Depression, unspecified: Secondary | ICD-10-CM | POA: Diagnosis not present

## 2023-04-27 DIAGNOSIS — F0283 Dementia in other diseases classified elsewhere, unspecified severity, with mood disturbance: Secondary | ICD-10-CM | POA: Diagnosis not present

## 2023-04-27 NOTE — Telephone Encounter (Signed)
PT swallowed Spiriva Capsule. Given Poison Control number to call asap.

## 2023-04-27 NOTE — Telephone Encounter (Signed)
FYI, any follow up needed?

## 2023-04-28 ENCOUNTER — Other Ambulatory Visit: Payer: Self-pay | Admitting: Family Medicine

## 2023-04-28 NOTE — Telephone Encounter (Signed)
Called and lm on pt vm tcb. 

## 2023-05-03 DIAGNOSIS — G9341 Metabolic encephalopathy: Secondary | ICD-10-CM | POA: Diagnosis not present

## 2023-05-03 DIAGNOSIS — F0283 Dementia in other diseases classified elsewhere, unspecified severity, with mood disturbance: Secondary | ICD-10-CM | POA: Diagnosis not present

## 2023-05-03 DIAGNOSIS — I5023 Acute on chronic systolic (congestive) heart failure: Secondary | ICD-10-CM | POA: Diagnosis not present

## 2023-05-03 DIAGNOSIS — B9689 Other specified bacterial agents as the cause of diseases classified elsewhere: Secondary | ICD-10-CM | POA: Diagnosis not present

## 2023-05-03 DIAGNOSIS — G4089 Other seizures: Secondary | ICD-10-CM | POA: Diagnosis not present

## 2023-05-03 DIAGNOSIS — I110 Hypertensive heart disease with heart failure: Secondary | ICD-10-CM | POA: Diagnosis not present

## 2023-05-03 DIAGNOSIS — F32A Depression, unspecified: Secondary | ICD-10-CM | POA: Diagnosis not present

## 2023-05-03 DIAGNOSIS — F0284 Dementia in other diseases classified elsewhere, unspecified severity, with anxiety: Secondary | ICD-10-CM | POA: Diagnosis not present

## 2023-05-03 DIAGNOSIS — N390 Urinary tract infection, site not specified: Secondary | ICD-10-CM | POA: Diagnosis not present

## 2023-05-04 ENCOUNTER — Encounter: Payer: Self-pay | Admitting: Family Medicine

## 2023-05-04 DIAGNOSIS — G9341 Metabolic encephalopathy: Secondary | ICD-10-CM | POA: Diagnosis not present

## 2023-05-04 DIAGNOSIS — I110 Hypertensive heart disease with heart failure: Secondary | ICD-10-CM | POA: Diagnosis not present

## 2023-05-04 DIAGNOSIS — G4089 Other seizures: Secondary | ICD-10-CM | POA: Diagnosis not present

## 2023-05-04 DIAGNOSIS — F32A Depression, unspecified: Secondary | ICD-10-CM | POA: Diagnosis not present

## 2023-05-04 DIAGNOSIS — N390 Urinary tract infection, site not specified: Secondary | ICD-10-CM | POA: Diagnosis not present

## 2023-05-04 DIAGNOSIS — I5023 Acute on chronic systolic (congestive) heart failure: Secondary | ICD-10-CM | POA: Diagnosis not present

## 2023-05-04 DIAGNOSIS — F0283 Dementia in other diseases classified elsewhere, unspecified severity, with mood disturbance: Secondary | ICD-10-CM | POA: Diagnosis not present

## 2023-05-04 DIAGNOSIS — F0284 Dementia in other diseases classified elsewhere, unspecified severity, with anxiety: Secondary | ICD-10-CM | POA: Diagnosis not present

## 2023-05-04 DIAGNOSIS — B9689 Other specified bacterial agents as the cause of diseases classified elsewhere: Secondary | ICD-10-CM | POA: Diagnosis not present

## 2023-05-05 DIAGNOSIS — N390 Urinary tract infection, site not specified: Secondary | ICD-10-CM | POA: Diagnosis not present

## 2023-05-05 DIAGNOSIS — B9689 Other specified bacterial agents as the cause of diseases classified elsewhere: Secondary | ICD-10-CM | POA: Diagnosis not present

## 2023-05-05 DIAGNOSIS — F32A Depression, unspecified: Secondary | ICD-10-CM | POA: Diagnosis not present

## 2023-05-05 DIAGNOSIS — G9341 Metabolic encephalopathy: Secondary | ICD-10-CM | POA: Diagnosis not present

## 2023-05-05 DIAGNOSIS — I5023 Acute on chronic systolic (congestive) heart failure: Secondary | ICD-10-CM | POA: Diagnosis not present

## 2023-05-05 DIAGNOSIS — I110 Hypertensive heart disease with heart failure: Secondary | ICD-10-CM | POA: Diagnosis not present

## 2023-05-05 DIAGNOSIS — F0284 Dementia in other diseases classified elsewhere, unspecified severity, with anxiety: Secondary | ICD-10-CM | POA: Diagnosis not present

## 2023-05-05 DIAGNOSIS — G4089 Other seizures: Secondary | ICD-10-CM | POA: Diagnosis not present

## 2023-05-05 DIAGNOSIS — F0283 Dementia in other diseases classified elsewhere, unspecified severity, with mood disturbance: Secondary | ICD-10-CM | POA: Diagnosis not present

## 2023-05-05 NOTE — Telephone Encounter (Signed)
See below

## 2023-05-08 DIAGNOSIS — F0283 Dementia in other diseases classified elsewhere, unspecified severity, with mood disturbance: Secondary | ICD-10-CM | POA: Diagnosis not present

## 2023-05-08 DIAGNOSIS — N390 Urinary tract infection, site not specified: Secondary | ICD-10-CM | POA: Diagnosis not present

## 2023-05-08 DIAGNOSIS — F0284 Dementia in other diseases classified elsewhere, unspecified severity, with anxiety: Secondary | ICD-10-CM | POA: Diagnosis not present

## 2023-05-08 DIAGNOSIS — I5023 Acute on chronic systolic (congestive) heart failure: Secondary | ICD-10-CM | POA: Diagnosis not present

## 2023-05-08 DIAGNOSIS — G9341 Metabolic encephalopathy: Secondary | ICD-10-CM | POA: Diagnosis not present

## 2023-05-08 DIAGNOSIS — G4089 Other seizures: Secondary | ICD-10-CM | POA: Diagnosis not present

## 2023-05-08 DIAGNOSIS — F32A Depression, unspecified: Secondary | ICD-10-CM | POA: Diagnosis not present

## 2023-05-08 DIAGNOSIS — B9689 Other specified bacterial agents as the cause of diseases classified elsewhere: Secondary | ICD-10-CM | POA: Diagnosis not present

## 2023-05-08 DIAGNOSIS — I110 Hypertensive heart disease with heart failure: Secondary | ICD-10-CM | POA: Diagnosis not present

## 2023-05-09 DIAGNOSIS — G9341 Metabolic encephalopathy: Secondary | ICD-10-CM | POA: Diagnosis not present

## 2023-05-09 DIAGNOSIS — F32A Depression, unspecified: Secondary | ICD-10-CM | POA: Diagnosis not present

## 2023-05-09 DIAGNOSIS — B9689 Other specified bacterial agents as the cause of diseases classified elsewhere: Secondary | ICD-10-CM | POA: Diagnosis not present

## 2023-05-09 DIAGNOSIS — F0284 Dementia in other diseases classified elsewhere, unspecified severity, with anxiety: Secondary | ICD-10-CM | POA: Diagnosis not present

## 2023-05-09 DIAGNOSIS — I5023 Acute on chronic systolic (congestive) heart failure: Secondary | ICD-10-CM | POA: Diagnosis not present

## 2023-05-09 DIAGNOSIS — F0283 Dementia in other diseases classified elsewhere, unspecified severity, with mood disturbance: Secondary | ICD-10-CM | POA: Diagnosis not present

## 2023-05-09 DIAGNOSIS — I110 Hypertensive heart disease with heart failure: Secondary | ICD-10-CM | POA: Diagnosis not present

## 2023-05-09 DIAGNOSIS — N390 Urinary tract infection, site not specified: Secondary | ICD-10-CM | POA: Diagnosis not present

## 2023-05-09 DIAGNOSIS — G4089 Other seizures: Secondary | ICD-10-CM | POA: Diagnosis not present

## 2023-05-11 ENCOUNTER — Other Ambulatory Visit (HOSPITAL_COMMUNITY): Payer: Self-pay

## 2023-05-11 ENCOUNTER — Encounter (HOSPITAL_BASED_OUTPATIENT_CLINIC_OR_DEPARTMENT_OTHER): Payer: Medicare HMO | Admitting: Family

## 2023-05-11 NOTE — Progress Notes (Deleted)
All other systems reviewed and are negative.  EKGs/Labs/Other Studies Reviewed:    Cardiac Studies & Procedures       ECHOCARDIOGRAM  ECHOCARDIOGRAM COMPLETE 02/17/2023  Narrative ECHOCARDIOGRAM REPORT    Patient Name:   Michelle [name] Date of Exam: 02/17/2023 Medical Rec #:  [id number]       Height:       65.0 in Accession #:    [id number]      Weight:       183.0 lb Date of Birth:  1932-11-23       BSA:          1.905 m Patient Age:    89 years        BP:           128/65 mmHg Patient Gender: F               HR:           97 bpm. Exam Location:  Inpatient  Procedure: Color Doppler, Cardiac Doppler and 2D Echo  Indications:    I42.9 Cardiomyopathy (unspecified)  History:        Patient has prior history of Echocardiogram examinations, most recent 03/05/2014. CAD and Previous Myocardial Infarction, COPD; Risk Factors:Diabetes, Hypertension and Dyslipidemia.  Sonographer:    Harriette Bouillon RDCS Referring Phys: [phone] ANKIT CHIRAG AMIN  IMPRESSIONS   1. Left ventricular ejection fraction, by estimation, is 50 to 55%. The left ventricle has low normal function. The basal LV is  hyperkinetic. There is hypokinesis of the LV apex extending to the mid to basal anteroseptal wall. Limited visualization of endocardium. Left ventricular diastolic parameters are indeterminate. 2. Right ventricular systolic function is normal. The right ventricular size is normal. 3. The mitral valve is normal in structure. No evidence of mitral valve regurgitation. No evidence of mitral stenosis. 4. The aortic valve is normal in structure. There is mild calcification of the aortic valve. Aortic valve regurgitation is not visualized. No aortic stenosis is present. 5. The inferior vena cava is normal in size with greater than 50% respiratory variability, suggesting right atrial pressure of 3 mmHg.  FINDINGS Left Ventricle: Left ventricular ejection fraction, by estimation, is 50 to 55%. The left ventricle has low normal function. The left ventricle demonstrates regional wall motion abnormalities. The left ventricular internal cavity size was normal in size. There is no left ventricular hypertrophy. Left ventricular diastolic parameters are indeterminate.   LV Wall Scoring: The mid and distal anterior septum, apical lateral segment, mid inferoseptal segment, and apex are hypokinetic.  Right Ventricle: The right ventricular size is normal. No increase in right ventricular wall thickness. Right ventricular systolic function is normal.  Left Atrium: Left atrial size was normal in size.  Right Atrium: Right atrial size was normal in size.  Pericardium: There is no evidence of pericardial effusion.  Mitral Valve: The mitral valve is normal in structure. No evidence of mitral valve regurgitation. No evidence of mitral valve stenosis.  Tricuspid Valve: The tricuspid valve is normal in structure. Tricuspid valve regurgitation is not demonstrated. No evidence of tricuspid stenosis.  Aortic Valve: The aortic valve is normal in structure. There is mild calcification of the aortic valve. Aortic valve  regurgitation is not visualized. No aortic stenosis is present.  Pulmonic Valve: The pulmonic valve was normal in structure. Pulmonic valve regurgitation is not visualized. No evidence of pulmonic stenosis.  Aorta: The aortic root is normal in size and structure.  Venous: The inferior vena cava  All other systems reviewed and are negative.  EKGs/Labs/Other Studies Reviewed:    Cardiac Studies & Procedures       ECHOCARDIOGRAM  ECHOCARDIOGRAM COMPLETE 02/17/2023  Narrative ECHOCARDIOGRAM REPORT    Patient Name:   Michelle [name] Date of Exam: 02/17/2023 Medical Rec #:  [id number]       Height:       65.0 in Accession #:    [id number]      Weight:       183.0 lb Date of Birth:  1932-11-23       BSA:          1.905 m Patient Age:    89 years        BP:           128/65 mmHg Patient Gender: F               HR:           97 bpm. Exam Location:  Inpatient  Procedure: Color Doppler, Cardiac Doppler and 2D Echo  Indications:    I42.9 Cardiomyopathy (unspecified)  History:        Patient has prior history of Echocardiogram examinations, most recent 03/05/2014. CAD and Previous Myocardial Infarction, COPD; Risk Factors:Diabetes, Hypertension and Dyslipidemia.  Sonographer:    Harriette Bouillon RDCS Referring Phys: [phone] ANKIT CHIRAG AMIN  IMPRESSIONS   1. Left ventricular ejection fraction, by estimation, is 50 to 55%. The left ventricle has low normal function. The basal LV is  hyperkinetic. There is hypokinesis of the LV apex extending to the mid to basal anteroseptal wall. Limited visualization of endocardium. Left ventricular diastolic parameters are indeterminate. 2. Right ventricular systolic function is normal. The right ventricular size is normal. 3. The mitral valve is normal in structure. No evidence of mitral valve regurgitation. No evidence of mitral stenosis. 4. The aortic valve is normal in structure. There is mild calcification of the aortic valve. Aortic valve regurgitation is not visualized. No aortic stenosis is present. 5. The inferior vena cava is normal in size with greater than 50% respiratory variability, suggesting right atrial pressure of 3 mmHg.  FINDINGS Left Ventricle: Left ventricular ejection fraction, by estimation, is 50 to 55%. The left ventricle has low normal function. The left ventricle demonstrates regional wall motion abnormalities. The left ventricular internal cavity size was normal in size. There is no left ventricular hypertrophy. Left ventricular diastolic parameters are indeterminate.   LV Wall Scoring: The mid and distal anterior septum, apical lateral segment, mid inferoseptal segment, and apex are hypokinetic.  Right Ventricle: The right ventricular size is normal. No increase in right ventricular wall thickness. Right ventricular systolic function is normal.  Left Atrium: Left atrial size was normal in size.  Right Atrium: Right atrial size was normal in size.  Pericardium: There is no evidence of pericardial effusion.  Mitral Valve: The mitral valve is normal in structure. No evidence of mitral valve regurgitation. No evidence of mitral valve stenosis.  Tricuspid Valve: The tricuspid valve is normal in structure. Tricuspid valve regurgitation is not demonstrated. No evidence of tricuspid stenosis.  Aortic Valve: The aortic valve is normal in structure. There is mild calcification of the aortic valve. Aortic valve  regurgitation is not visualized. No aortic stenosis is present.  Pulmonic Valve: The pulmonic valve was normal in structure. Pulmonic valve regurgitation is not visualized. No evidence of pulmonic stenosis.  Aorta: The aortic root is normal in size and structure.  Venous: The inferior vena cava  Advanced Hypertension Clinic Assessment:    Date:  05/11/2023   ID:  Michelle [name], DOB [birth year], MRN [id number]  PCP:  Shelva Majestic, MD  Cardiologist:  Verne Carrow, MD  Nephrologist:  Referring MD: Shelva Majestic, MD   CC: Hypertension  History of Present Illness:    BRAYLAN FLICKER is a 89 y.o. female with a hx of CAD (angioplasty of RCA 1990, inferior STEMI 02/2014 RCA treated with 2 covered stents), DM 2, HTN, HLD, prior tobacco use, chronic diastolic heart failure.  Presents today for follow-up on hypertension clinic.   Established with advanced hypertension clinic February 2022.  Admitted 08/2020 with confusion BP 186/81, hypertensive encephalopathy.  Amlodipine, enalapril, spironolactone continued.  Metoprolol increased and hydralazine added.  Furosemide reduced due to concern for dehydration.  Cortisol normal limits.  Renal artery Dopplers 07/2020 unremarkable.  At visit 03/2022 due to labile blood pressure hydralazine adjusted to 50 mg a.m., 25 mg afternoon, 50 mg p.m.  Every 07/02/2022 she noted blood pressure was overall well-controlled at home as recommended continue same regimen.  Virtual visit with Dr. Duke Salvia 09/06/2022 with blood pressure continuing to be very labile but overall controlled.  She was instructed if blood pressure was running low to reduce hydralazine or amlodipine dose.  Urine catecholamines and metanephrines were ordered and unremarkable. At visit 12/06/22 her BP was well controlled on regimen of Amlodipine 2.5mg  QAM, Carvedilol 12.5mg  BID, Enalapril 20mg  BID, Lasix 20mg  QD, Hydralazine 50mg  TID, Spironolactone 25mg  QD.   Admitted 7/22-02/18/23 with seizure and fall with concern for trauma requiring intubation with acute metabolic encephalopathy. She had transient afib with RVR. Did have brief volume overload with echo with preserved LVEF. Inadvertent finding of 2mm lung nodule recommended for CT chest in 3 months. Discharged to [facility]  and returned home 03/17/23.   Last seen 03/23/23 with hypotension SBP as low as 70s since leaving SNF. Furosemide changed to PRN, Spironolactone stopped. Enalapril reduced from 20mg  BID to daily. Amlodipine 2.5mg  QAM continued (hold if SBP <110/60). Coreg 12.5mg  BID continued. Hydralazine adjusted to PRN for SBP >160.   Presents today for follow up with her sons. ***  Past Medical History:  Diagnosis Date   Anginal pain (HCC)    Arthritis    AV block, 1st degree    CAD (coronary artery disease) 1990; 2015   Cardiac cath 1990 with Dr. Riley Kill and [name] Residual CAD, non obstructive per 2015 cath. STEMI status post stent in August 2015.   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   Promus DES to RCA with STEMI   Shortness of breath     Past Surgical History:  Procedure Laterality Date   CATARACT EXTRACTION  2020   right and left eye    CHOLECYSTECTOMY     thinks her appendix was removed at the same time   CORONARY ANGIOPLASTY WITH STENT PLACEMENT  02/18/14   Promus DES to RCA   LEFT HEART CATHETERIZATION WITH CORONARY ANGIOGRAM N/A 02/18/2014  Advanced Hypertension Clinic Assessment:    Date:  05/11/2023   ID:  Michelle [name], DOB [birth year], MRN [id number]  PCP:  Shelva Majestic, MD  Cardiologist:  Verne Carrow, MD  Nephrologist:  Referring MD: Shelva Majestic, MD   CC: Hypertension  History of Present Illness:    BRAYLAN FLICKER is a 89 y.o. female with a hx of CAD (angioplasty of RCA 1990, inferior STEMI 02/2014 RCA treated with 2 covered stents), DM 2, HTN, HLD, prior tobacco use, chronic diastolic heart failure.  Presents today for follow-up on hypertension clinic.   Established with advanced hypertension clinic February 2022.  Admitted 08/2020 with confusion BP 186/81, hypertensive encephalopathy.  Amlodipine, enalapril, spironolactone continued.  Metoprolol increased and hydralazine added.  Furosemide reduced due to concern for dehydration.  Cortisol normal limits.  Renal artery Dopplers 07/2020 unremarkable.  At visit 03/2022 due to labile blood pressure hydralazine adjusted to 50 mg a.m., 25 mg afternoon, 50 mg p.m.  Every 07/02/2022 she noted blood pressure was overall well-controlled at home as recommended continue same regimen.  Virtual visit with Dr. Duke Salvia 09/06/2022 with blood pressure continuing to be very labile but overall controlled.  She was instructed if blood pressure was running low to reduce hydralazine or amlodipine dose.  Urine catecholamines and metanephrines were ordered and unremarkable. At visit 12/06/22 her BP was well controlled on regimen of Amlodipine 2.5mg  QAM, Carvedilol 12.5mg  BID, Enalapril 20mg  BID, Lasix 20mg  QD, Hydralazine 50mg  TID, Spironolactone 25mg  QD.   Admitted 7/22-02/18/23 with seizure and fall with concern for trauma requiring intubation with acute metabolic encephalopathy. She had transient afib with RVR. Did have brief volume overload with echo with preserved LVEF. Inadvertent finding of 2mm lung nodule recommended for CT chest in 3 months. Discharged to [facility]  and returned home 03/17/23.   Last seen 03/23/23 with hypotension SBP as low as 70s since leaving SNF. Furosemide changed to PRN, Spironolactone stopped. Enalapril reduced from 20mg  BID to daily. Amlodipine 2.5mg  QAM continued (hold if SBP <110/60). Coreg 12.5mg  BID continued. Hydralazine adjusted to PRN for SBP >160.   Presents today for follow up with her sons. ***  Past Medical History:  Diagnosis Date   Anginal pain (HCC)    Arthritis    AV block, 1st degree    CAD (coronary artery disease) 1990; 2015   Cardiac cath 1990 with Dr. Riley Kill and [name] Residual CAD, non obstructive per 2015 cath. STEMI status post stent in August 2015.   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  EKGs/Labs/Other Studies Reviewed:    Cardiac Studies & Procedures       ECHOCARDIOGRAM  ECHOCARDIOGRAM COMPLETE 02/17/2023  Narrative ECHOCARDIOGRAM REPORT    Patient Name:   Michelle [name] Date of Exam: 02/17/2023 Medical Rec #:  [id number]       Height:       65.0 in Accession #:    [id number]      Weight:       183.0 lb Date of Birth:  1932-11-23       BSA:          1.905 m Patient Age:    89 years        BP:           128/65 mmHg Patient Gender: F               HR:           97 bpm. Exam Location:  Inpatient  Procedure: Color Doppler, Cardiac Doppler and 2D Echo  Indications:    I42.9 Cardiomyopathy (unspecified)  History:        Patient has prior history of Echocardiogram examinations, most recent 03/05/2014. CAD and Previous Myocardial Infarction, COPD; Risk Factors:Diabetes, Hypertension and Dyslipidemia.  Sonographer:    Harriette Bouillon RDCS Referring Phys: [phone] ANKIT CHIRAG AMIN  IMPRESSIONS   1. Left ventricular ejection fraction, by estimation, is 50 to 55%. The left ventricle has low normal function. The basal LV is  hyperkinetic. There is hypokinesis of the LV apex extending to the mid to basal anteroseptal wall. Limited visualization of endocardium. Left ventricular diastolic parameters are indeterminate. 2. Right ventricular systolic function is normal. The right ventricular size is normal. 3. The mitral valve is normal in structure. No evidence of mitral valve regurgitation. No evidence of mitral stenosis. 4. The aortic valve is normal in structure. There is mild calcification of the aortic valve. Aortic valve regurgitation is not visualized. No aortic stenosis is present. 5. The inferior vena cava is normal in size with greater than 50% respiratory variability, suggesting right atrial pressure of 3 mmHg.  FINDINGS Left Ventricle: Left ventricular ejection fraction, by estimation, is 50 to 55%. The left ventricle has low normal function. The left ventricle demonstrates regional wall motion abnormalities. The left ventricular internal cavity size was normal in size. There is no left ventricular hypertrophy. Left ventricular diastolic parameters are indeterminate.   LV Wall Scoring: The mid and distal anterior septum, apical lateral segment, mid inferoseptal segment, and apex are hypokinetic.  Right Ventricle: The right ventricular size is normal. No increase in right ventricular wall thickness. Right ventricular systolic function is normal.  Left Atrium: Left atrial size was normal in size.  Right Atrium: Right atrial size was normal in size.  Pericardium: There is no evidence of pericardial effusion.  Mitral Valve: The mitral valve is normal in structure. No evidence of mitral valve regurgitation. No evidence of mitral valve stenosis.  Tricuspid Valve: The tricuspid valve is normal in structure. Tricuspid valve regurgitation is not demonstrated. No evidence of tricuspid stenosis.  Aortic Valve: The aortic valve is normal in structure. There is mild calcification of the aortic valve. Aortic valve  regurgitation is not visualized. No aortic stenosis is present.  Pulmonic Valve: The pulmonic valve was normal in structure. Pulmonic valve regurgitation is not visualized. No evidence of pulmonic stenosis.  Aorta: The aortic root is normal in size and structure.  Venous: The inferior vena cava

## 2023-05-12 DIAGNOSIS — F32A Depression, unspecified: Secondary | ICD-10-CM | POA: Diagnosis not present

## 2023-05-12 DIAGNOSIS — F0284 Dementia in other diseases classified elsewhere, unspecified severity, with anxiety: Secondary | ICD-10-CM | POA: Diagnosis not present

## 2023-05-12 DIAGNOSIS — G4089 Other seizures: Secondary | ICD-10-CM | POA: Diagnosis not present

## 2023-05-12 DIAGNOSIS — F0283 Dementia in other diseases classified elsewhere, unspecified severity, with mood disturbance: Secondary | ICD-10-CM | POA: Diagnosis not present

## 2023-05-12 DIAGNOSIS — I5023 Acute on chronic systolic (congestive) heart failure: Secondary | ICD-10-CM | POA: Diagnosis not present

## 2023-05-12 DIAGNOSIS — G9341 Metabolic encephalopathy: Secondary | ICD-10-CM | POA: Diagnosis not present

## 2023-05-12 DIAGNOSIS — N390 Urinary tract infection, site not specified: Secondary | ICD-10-CM | POA: Diagnosis not present

## 2023-05-12 DIAGNOSIS — I110 Hypertensive heart disease with heart failure: Secondary | ICD-10-CM | POA: Diagnosis not present

## 2023-05-12 DIAGNOSIS — B9689 Other specified bacterial agents as the cause of diseases classified elsewhere: Secondary | ICD-10-CM | POA: Diagnosis not present

## 2023-05-16 ENCOUNTER — Telehealth: Payer: Self-pay | Admitting: Family Medicine

## 2023-05-16 ENCOUNTER — Telehealth (HOSPITAL_BASED_OUTPATIENT_CLINIC_OR_DEPARTMENT_OTHER): Payer: Self-pay | Admitting: Cardiovascular Disease

## 2023-05-16 DIAGNOSIS — I5023 Acute on chronic systolic (congestive) heart failure: Secondary | ICD-10-CM | POA: Diagnosis not present

## 2023-05-16 DIAGNOSIS — F32A Depression, unspecified: Secondary | ICD-10-CM | POA: Diagnosis not present

## 2023-05-16 DIAGNOSIS — F0283 Dementia in other diseases classified elsewhere, unspecified severity, with mood disturbance: Secondary | ICD-10-CM | POA: Diagnosis not present

## 2023-05-16 DIAGNOSIS — G4089 Other seizures: Secondary | ICD-10-CM | POA: Diagnosis not present

## 2023-05-16 DIAGNOSIS — F0284 Dementia in other diseases classified elsewhere, unspecified severity, with anxiety: Secondary | ICD-10-CM | POA: Diagnosis not present

## 2023-05-16 DIAGNOSIS — B9689 Other specified bacterial agents as the cause of diseases classified elsewhere: Secondary | ICD-10-CM | POA: Diagnosis not present

## 2023-05-16 DIAGNOSIS — N390 Urinary tract infection, site not specified: Secondary | ICD-10-CM | POA: Diagnosis not present

## 2023-05-16 DIAGNOSIS — G9341 Metabolic encephalopathy: Secondary | ICD-10-CM | POA: Diagnosis not present

## 2023-05-16 DIAGNOSIS — I110 Hypertensive heart disease with heart failure: Secondary | ICD-10-CM | POA: Diagnosis not present

## 2023-05-16 NOTE — Telephone Encounter (Signed)
Call transferred from call center,   Home Health Nurse sonia ([facility]), patient has had a 3 pound weight gain overnight, no swelling, lungs are clear no shortness of breath. Patient has PRN lasix on file. Advised to see if patient had utilized PRN lasix, if not can take once daily until patient returns to dry weight. If already taken and still had the weight gain instructed to call back for further orders.

## 2023-05-16 NOTE — Telephone Encounter (Signed)
Home Health Verbal Orders  Agency:  [facility]  Caller:  Kendall Flack and title  Requesting OT/ PT/ Skilled nursing/ Social Work/ Speech:    Reason for Request:  OT for 1 time a week for 4 weeks  Frequency:    HH needs F2F w/in last 30 days     [phone]

## 2023-05-16 NOTE — Telephone Encounter (Signed)
New Message:       Nurse is at the patient's home, need to give an update on patient's condition.

## 2023-05-17 ENCOUNTER — Encounter (HOSPITAL_COMMUNITY): Payer: Self-pay | Admitting: Internal Medicine

## 2023-05-17 ENCOUNTER — Inpatient Hospital Stay (HOSPITAL_COMMUNITY)
Admission: EM | Admit: 2023-05-17 | Discharge: 2023-05-26 | DRG: 100 | Disposition: A | Discharge status: Skilled Nursing Facility | Payer: Medicare HMO | Attending: Family Medicine | Admitting: Family Medicine

## 2023-05-17 ENCOUNTER — Other Ambulatory Visit: Payer: Self-pay

## 2023-05-17 ENCOUNTER — Emergency Department (HOSPITAL_COMMUNITY): Payer: Medicare HMO

## 2023-05-17 ENCOUNTER — Observation Stay (HOSPITAL_COMMUNITY): Payer: Medicare HMO

## 2023-05-17 DIAGNOSIS — Z1152 Encounter for screening for COVID-19: Secondary | ICD-10-CM

## 2023-05-17 DIAGNOSIS — S0231XA Fracture of orbital floor, right side, initial encounter for closed fracture: Secondary | ICD-10-CM | POA: Diagnosis not present

## 2023-05-17 DIAGNOSIS — Z7902 Long term (current) use of antithrombotics/antiplatelets: Secondary | ICD-10-CM | POA: Diagnosis not present

## 2023-05-17 DIAGNOSIS — R569 Unspecified convulsions: Principal | ICD-10-CM

## 2023-05-17 DIAGNOSIS — N390 Urinary tract infection, site not specified: Secondary | ICD-10-CM | POA: Diagnosis not present

## 2023-05-17 DIAGNOSIS — R488 Other symbolic dysfunctions: Secondary | ICD-10-CM | POA: Diagnosis not present

## 2023-05-17 DIAGNOSIS — G40919 Epilepsy, unspecified, intractable, without status epilepticus: Secondary | ICD-10-CM

## 2023-05-17 DIAGNOSIS — Z885 Allergy status to narcotic agent status: Secondary | ICD-10-CM

## 2023-05-17 DIAGNOSIS — E785 Hyperlipidemia, unspecified: Secondary | ICD-10-CM

## 2023-05-17 DIAGNOSIS — Z955 Presence of coronary angioplasty implant and graft: Secondary | ICD-10-CM

## 2023-05-17 DIAGNOSIS — R4182 Altered mental status, unspecified: Secondary | ICD-10-CM | POA: Diagnosis not present

## 2023-05-17 DIAGNOSIS — I6782 Cerebral ischemia: Secondary | ICD-10-CM | POA: Diagnosis not present

## 2023-05-17 DIAGNOSIS — F03918 Unspecified dementia, unspecified severity, with other behavioral disturbance: Secondary | ICD-10-CM | POA: Diagnosis present

## 2023-05-17 DIAGNOSIS — E1169 Type 2 diabetes mellitus with other specified complication: Secondary | ICD-10-CM

## 2023-05-17 DIAGNOSIS — I2510 Atherosclerotic heart disease of native coronary artery without angina pectoris: Secondary | ICD-10-CM | POA: Diagnosis present

## 2023-05-17 DIAGNOSIS — I6900 Unspecified sequelae of nontraumatic subarachnoid hemorrhage: Secondary | ICD-10-CM | POA: Diagnosis not present

## 2023-05-17 DIAGNOSIS — I5032 Chronic diastolic (congestive) heart failure: Secondary | ICD-10-CM

## 2023-05-17 DIAGNOSIS — Z66 Do not resuscitate: Secondary | ICD-10-CM | POA: Diagnosis present

## 2023-05-17 DIAGNOSIS — R0989 Other specified symptoms and signs involving the circulatory and respiratory systems: Secondary | ICD-10-CM | POA: Diagnosis not present

## 2023-05-17 DIAGNOSIS — R404 Transient alteration of awareness: Secondary | ICD-10-CM | POA: Diagnosis not present

## 2023-05-17 DIAGNOSIS — R079 Chest pain, unspecified: Secondary | ICD-10-CM | POA: Diagnosis not present

## 2023-05-17 DIAGNOSIS — R140 Abdominal distension (gaseous): Secondary | ICD-10-CM | POA: Diagnosis not present

## 2023-05-17 DIAGNOSIS — Z23 Encounter for immunization: Secondary | ICD-10-CM

## 2023-05-17 DIAGNOSIS — G928 Other toxic encephalopathy: Secondary | ICD-10-CM | POA: Diagnosis present

## 2023-05-17 DIAGNOSIS — G40901 Epilepsy, unspecified, not intractable, with status epilepticus: Principal | ICD-10-CM | POA: Diagnosis present

## 2023-05-17 DIAGNOSIS — I252 Old myocardial infarction: Secondary | ICD-10-CM | POA: Diagnosis not present

## 2023-05-17 DIAGNOSIS — Z741 Need for assistance with personal care: Secondary | ICD-10-CM | POA: Diagnosis not present

## 2023-05-17 DIAGNOSIS — Z794 Long term (current) use of insulin: Secondary | ICD-10-CM | POA: Diagnosis not present

## 2023-05-17 DIAGNOSIS — G4700 Insomnia, unspecified: Secondary | ICD-10-CM | POA: Diagnosis present

## 2023-05-17 DIAGNOSIS — E876 Hypokalemia: Secondary | ICD-10-CM | POA: Diagnosis not present

## 2023-05-17 DIAGNOSIS — F331 Major depressive disorder, recurrent, moderate: Secondary | ICD-10-CM | POA: Diagnosis not present

## 2023-05-17 DIAGNOSIS — Z8249 Family history of ischemic heart disease and other diseases of the circulatory system: Secondary | ICD-10-CM | POA: Diagnosis not present

## 2023-05-17 DIAGNOSIS — Z993 Dependence on wheelchair: Secondary | ICD-10-CM | POA: Diagnosis not present

## 2023-05-17 DIAGNOSIS — N939 Abnormal uterine and vaginal bleeding, unspecified: Secondary | ICD-10-CM | POA: Diagnosis present

## 2023-05-17 DIAGNOSIS — R278 Other lack of coordination: Secondary | ICD-10-CM | POA: Diagnosis not present

## 2023-05-17 DIAGNOSIS — R41841 Cognitive communication deficit: Secondary | ICD-10-CM | POA: Diagnosis not present

## 2023-05-17 DIAGNOSIS — Z87891 Personal history of nicotine dependence: Secondary | ICD-10-CM | POA: Diagnosis not present

## 2023-05-17 DIAGNOSIS — Z7951 Long term (current) use of inhaled steroids: Secondary | ICD-10-CM

## 2023-05-17 DIAGNOSIS — I10 Essential (primary) hypertension: Secondary | ICD-10-CM | POA: Diagnosis not present

## 2023-05-17 DIAGNOSIS — G4089 Other seizures: Secondary | ICD-10-CM | POA: Diagnosis not present

## 2023-05-17 DIAGNOSIS — R000 Tachycardia, unspecified: Secondary | ICD-10-CM | POA: Diagnosis not present

## 2023-05-17 DIAGNOSIS — I110 Hypertensive heart disease with heart failure: Secondary | ICD-10-CM | POA: Diagnosis present

## 2023-05-17 DIAGNOSIS — Z7982 Long term (current) use of aspirin: Secondary | ICD-10-CM

## 2023-05-17 DIAGNOSIS — R0602 Shortness of breath: Secondary | ICD-10-CM | POA: Diagnosis not present

## 2023-05-17 DIAGNOSIS — Z79899 Other long term (current) drug therapy: Secondary | ICD-10-CM

## 2023-05-17 DIAGNOSIS — M6259 Muscle wasting and atrophy, not elsewhere classified, multiple sites: Secondary | ICD-10-CM | POA: Diagnosis not present

## 2023-05-17 DIAGNOSIS — E119 Type 2 diabetes mellitus without complications: Secondary | ICD-10-CM

## 2023-05-17 DIAGNOSIS — M6281 Muscle weakness (generalized): Secondary | ICD-10-CM | POA: Diagnosis not present

## 2023-05-17 DIAGNOSIS — Z7401 Bed confinement status: Secondary | ICD-10-CM | POA: Diagnosis not present

## 2023-05-17 DIAGNOSIS — J449 Chronic obstructive pulmonary disease, unspecified: Secondary | ICD-10-CM | POA: Diagnosis present

## 2023-05-17 DIAGNOSIS — Z833 Family history of diabetes mellitus: Secondary | ICD-10-CM

## 2023-05-17 LAB — CBC WITH DIFFERENTIAL/PLATELET
Abs Immature Granulocytes: 0.04 10*3/uL (ref 0.00–0.07)
Basophils Absolute: 0 10*3/uL (ref 0.0–0.1)
Basophils Relative: 0 %
Eosinophils Absolute: 0 10*3/uL (ref 0.0–0.5)
Eosinophils Relative: 1 %
HCT: 38.3 % (ref 36.0–46.0)
Hemoglobin: 11.8 g/dL — ABNORMAL LOW (ref 12.0–15.0)
Immature Granulocytes: 1 %
Lymphocytes Relative: 17 %
Lymphs Abs: 1.3 10*3/uL (ref 0.7–4.0)
MCH: 26.2 pg (ref 26.0–34.0)
MCHC: 30.8 g/dL (ref 30.0–36.0)
MCV: 84.9 fL (ref 80.0–100.0)
Monocytes Absolute: 0.4 10*3/uL (ref 0.1–1.0)
Monocytes Relative: 6 %
Neutro Abs: 5.8 10*3/uL (ref 1.7–7.7)
Neutrophils Relative %: 75 %
Platelets: 296 10*3/uL (ref 150–400)
RBC: 4.51 MIL/uL (ref 3.87–5.11)
RDW: 14.3 % (ref 11.5–15.5)
WBC: 7.6 10*3/uL (ref 4.0–10.5)
nRBC: 0 % (ref 0.0–0.2)

## 2023-05-17 LAB — BASIC METABOLIC PANEL
Anion gap: 12 (ref 5–15)
BUN: 21 mg/dL (ref 8–23)
CO2: 20 mmol/L — ABNORMAL LOW (ref 22–32)
Calcium: 9 mg/dL (ref 8.9–10.3)
Chloride: 105 mmol/L (ref 98–111)
Creatinine, Ser: 0.8 mg/dL (ref 0.44–1.00)
GFR, Estimated: >60 mL/min (ref >60)
Glucose, Bld: 145 mg/dL — ABNORMAL HIGH (ref 70–99)
Potassium: 4 mmol/L (ref 3.5–5.1)
Sodium: 137 mmol/L (ref 135–145)

## 2023-05-17 LAB — URINALYSIS, ROUTINE W REFLEX MICROSCOPIC
Bilirubin Urine: NEGATIVE
Glucose, UA: NEGATIVE mg/dL
Hgb urine dipstick: NEGATIVE
Ketones, ur: NEGATIVE mg/dL
Leukocytes,Ua: NEGATIVE
Nitrite: NEGATIVE
Protein, ur: 30 mg/dL — AB
Specific Gravity, Urine: 1.014 (ref 1.005–1.030)
pH: 5 (ref 5.0–8.0)

## 2023-05-17 LAB — MAGNESIUM: Magnesium: 1.3 mg/dL — ABNORMAL LOW (ref 1.7–2.4)

## 2023-05-17 LAB — RESP PANEL BY RT-PCR (RSV, FLU A&B, COVID)  RVPGX2
Influenza A by PCR: NEGATIVE
Influenza B by PCR: NEGATIVE
Resp Syncytial Virus by PCR: NEGATIVE
SARS Coronavirus 2 by RT PCR: NEGATIVE

## 2023-05-17 LAB — RAPID URINE DRUG SCREEN, HOSP PERFORMED
Amphetamines: NOT DETECTED
Barbiturates: NOT DETECTED
Benzodiazepines: NOT DETECTED
Cocaine: NOT DETECTED
Opiates: NOT DETECTED
Tetrahydrocannabinol: NOT DETECTED

## 2023-05-17 LAB — TSH: TSH: 1.918 u[IU]/mL (ref 0.350–4.500)

## 2023-05-17 LAB — CK: Total CK: 37 U/L — ABNORMAL LOW (ref 38–234)

## 2023-05-17 LAB — ETHANOL: Alcohol, Ethyl (B): <10 mg/dL (ref <10)

## 2023-05-17 LAB — GLUCOSE, CAPILLARY: Glucose-Capillary: 99 mg/dL (ref 70–99)

## 2023-05-17 LAB — CBG MONITORING, ED
Glucose-Capillary: 152 mg/dL — ABNORMAL HIGH (ref 70–99)
Glucose-Capillary: 102 mg/dL — ABNORMAL HIGH (ref 70–99)

## 2023-05-17 MED ORDER — MIDAZOLAM HCL 2 MG/2ML IJ SOLN
1.0000 mg | INTRAMUSCULAR | Status: DC | PRN
Start: 2023-05-17 — End: 2023-05-26

## 2023-05-17 MED ORDER — LABETALOL HCL 5 MG/ML IV SOLN
10.0000 mg | INTRAVENOUS | Status: DC | PRN
  Administered 2023-05-21 – 2023-05-22 (×4): 10 mg via INTRAVENOUS
  Filled 2023-05-17 (×4): qty 4

## 2023-05-17 MED ORDER — MAGNESIUM SULFATE 2 GM/50ML IV SOLN
2.0000 g | Freq: Once | INTRAVENOUS | Status: AC
  Administered 2023-05-17: 2 g via INTRAVENOUS
  Filled 2023-05-17: qty 50

## 2023-05-17 MED ORDER — MELATONIN 5 MG PO TABS
5.0000 mg | ORAL_TABLET | Freq: Every evening | ORAL | Status: DC | PRN
  Administered 2023-05-19: 5 mg via ORAL
  Filled 2023-05-17: qty 1

## 2023-05-17 MED ORDER — INSULIN ASPART 100 UNIT/ML IJ SOLN
0.0000 [IU] | Freq: Every day | INTRAMUSCULAR | Status: DC
Start: 2023-05-17 — End: 2023-05-26

## 2023-05-17 MED ORDER — HEPARIN SODIUM (PORCINE) 5000 UNIT/ML IJ SOLN
5000.0000 [IU] | Freq: Three times a day (TID) | INTRAMUSCULAR | Status: DC
  Administered 2023-05-17 – 2023-05-20 (×8): 5000 [IU] via SUBCUTANEOUS
  Filled 2023-05-17 (×8): qty 1

## 2023-05-17 MED ORDER — LORAZEPAM 2 MG/ML IJ SOLN
INTRAMUSCULAR | Status: AC
  Filled 2023-05-17: qty 1

## 2023-05-17 MED ORDER — ROSUVASTATIN CALCIUM 40 MG PO TABS: 40.0000 mg | ORAL_TABLET | Freq: Every day | ORAL | 3 refills | Status: DC

## 2023-05-17 MED ORDER — ONDANSETRON HCL 4 MG/2ML IJ SOLN: 4.0000 mg | Freq: Four times a day (QID) | INTRAMUSCULAR | Status: DC | PRN

## 2023-05-17 MED ORDER — SODIUM CHLORIDE 0.9 % IV SOLN
50.0000 mg | Freq: Every day | INTRAVENOUS | Status: DC
  Administered 2023-05-17 – 2023-05-18 (×2): 50 mg via INTRAVENOUS
  Filled 2023-05-17 (×6): qty 5

## 2023-05-17 MED ORDER — ACETAMINOPHEN 650 MG RE SUPP: 650.0000 mg | RECTAL | Status: DC | PRN

## 2023-05-17 MED ORDER — LAMOTRIGINE 25 MG PO TABS
50.0000 mg | ORAL_TABLET | Freq: Every day | ORAL | Status: DC
  Administered 2023-05-19 – 2023-05-25 (×8): 50 mg via ORAL
  Filled 2023-05-17 (×9): qty 2

## 2023-05-17 MED ORDER — LORAZEPAM 2 MG/ML IJ SOLN
2.0000 mg | Freq: Once | INTRAMUSCULAR | Status: AC
  Administered 2023-05-17: 2 mg via INTRAVENOUS

## 2023-05-17 MED ORDER — LEVETIRACETAM IN NACL 1500 MG/100ML IV SOLN
1500.0000 mg | Freq: Once | INTRAVENOUS | Status: AC
  Administered 2023-05-17: 1500 mg via INTRAVENOUS
  Filled 2023-05-17: qty 100

## 2023-05-17 MED ORDER — LACTATED RINGERS IV SOLN: INTRAVENOUS | Status: AC

## 2023-05-17 MED ORDER — INSULIN ASPART 100 UNIT/ML IJ SOLN
0.0000 [IU] | Freq: Three times a day (TID) | INTRAMUSCULAR | Status: DC
Start: 2023-05-17 — End: 2023-05-26
  Administered 2023-05-18: 2 [IU] via SUBCUTANEOUS
  Administered 2023-05-19: 3 [IU] via SUBCUTANEOUS
  Administered 2023-05-19: 2 [IU] via SUBCUTANEOUS
  Administered 2023-05-19 – 2023-05-20 (×3): 3 [IU] via SUBCUTANEOUS
  Administered 2023-05-20 – 2023-05-21 (×3): 2 [IU] via SUBCUTANEOUS
  Administered 2023-05-21: 3 [IU] via SUBCUTANEOUS
  Administered 2023-05-22: 5 [IU] via SUBCUTANEOUS
  Administered 2023-05-22 (×2): 3 [IU] via SUBCUTANEOUS
  Administered 2023-05-23: 2 [IU] via SUBCUTANEOUS
  Administered 2023-05-23: 5 [IU] via SUBCUTANEOUS
  Administered 2023-05-23: 3 [IU] via SUBCUTANEOUS
  Administered 2023-05-24 (×2): 2 [IU] via SUBCUTANEOUS
  Administered 2023-05-25: 3 [IU] via SUBCUTANEOUS

## 2023-05-17 MED ORDER — SODIUM CHLORIDE 0.9 % IV SOLN
50.0000 mg | Freq: Every day | INTRAVENOUS | Status: DC
  Administered 2023-05-18: 50 mg via INTRAVENOUS
  Filled 2023-05-17 (×2): qty 5

## 2023-05-17 MED ORDER — LAMOTRIGINE 25 MG PO TABS
25.0000 mg | ORAL_TABLET | Freq: Every day | ORAL | Status: DC
  Administered 2023-05-19 – 2023-05-26 (×8): 25 mg via ORAL
  Filled 2023-05-17 (×9): qty 1

## 2023-05-17 NOTE — Assessment & Plan Note (Signed)
Stable. Hold statin while NPO.

## 2023-05-17 NOTE — Consult Note (Signed)
Neurology Consultation Reason for Consult: seizure Referring Physician: Dr. Edwin Dada  CC: Seizure  History is obtained from:   chart review  HPI: Michelle [name] is a 89 y.o. female with past medical history of cognitive impairment, epilepsy (EEG showed left frontotemporal sharp waves) on lamotrigine 25 mg twice daily who presented with breakthrough seizures.  Patient is altered and unable to provide any history.  Per sons at bedside, this morning when son went to check on her, he heard her snoring.  Later around 8 AM he went to give her her medications but was unable to wake her up.  Therefore he called EMS.  She was subsequently brought to the emergency room.  While in the emergency room she was noted to have myoclonic seizure-like episode and was given Ativan as well as loaded with 1500 mg IV Keppra.  Neurology was consulted for further recommendations  Seizure history: In June 2022, patient started having episodes of "blank stare".  EEG showed left frontotemporal sharp waves.  Patient was initially started on Depakote which was not tolerated well and subsequently switched to lamotrigine.  Since being on lamotrigine, per son at bedside patient continues to have intermittent seizures described as jerking of extremities lasting for few minutes every few months. Last episode was in July 2024.  Denies any clear provoking factors.  Reports compliance with medications.  Denies any recent illness, changes in medication.  Of note at baseline patient needs help getting in and out of shower but is able to dress herself, eat her meals.  Does sleep a lot.  Sons also report that some days she forgets that her parents and brothers have died, cannot recognize her sons.  Current AEDs: Lamotrigine 25 mg twice daily  Prior AEDs: Depakote  Ambulatory EEG 10/14/2020: This 24-hour ambulatory video EEG study is abnormal due to the presence of: 1. Occasional focal slowing over the left temporal region 2. Rare  independent focal slowing over the right temporal region 3. Occasional left frontotemporal epileptiform discharges   CLINICAL CORRELATION of the above findings indicates focal cerebral dysfunction over the bilateral temporal regions suggestive of underlying structural or physiologic abnormality. There is a tendency for seizures to arise from the left frontotemporal region. If further clinical questions remain, inpatient video EEG monitoring may be helpful.  MRI brain without contrast 06/25/2020: Senescent changes without acute intracranial finding.   ROS: Unable to obtain due to altered mental status.   Past Medical History:  Diagnosis Date   Anginal pain (HCC)    Arthritis    AV block, 1st degree    CAD (coronary artery disease) 1990; 2015   Cardiac cath 1990 with Dr. Riley Kill and [name] Residual CAD, non obstructive per 2015 cath. STEMI status post stent in August 2015.   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 Her smoking use included cigarettes. She started smoking about 71 years ago. She has a 55 pack-year smoking history. She has never used smokeless tobacco. She reports that she does not drink alcohol and does not use drugs.  Exam: Current vital signs: BP (!) 164/72   Pulse 88   Temp 98 F (36.7 C)   Resp (!) 27   Wt 77.1 kg   SpO2 95%   BMI 28.29 kg/m  Vital signs in last 24 hours: Temp:  [98 F (36.7 C)-98.2 F (36.8 C)] 98 F (36.7 C) (10/30 1245) Pulse Rate:  [88-130] 88 (10/30 1315) Resp:  [11-39] 27 (10/30 1315) BP: (149-186)/(67-109) 164/72 (10/30 1315) SpO2:  [60 %-100 %] 95 % (10/30 1315) Weight:  [77.1 kg] 77.1 kg (10/30 0913)   Physical Exam  Constitutional: Laying in bed, not in apparent distress Neuro: Does not open eyes to noxious stimuli, does not follow meds, on passive eye opening, pupils appear equally round and reactive without any forced gaze deviation, no apparent facial asymmetry, spontaneously moving all 4 extremities with antigravity strength   I have reviewed labs in epic and the results pertinent to this consultation are: CBC:  Recent Labs  Lab 05/17/23 0918  WBC 7.6  NEUTROABS 5.8  HGB 11.8*  HCT 38.3  MCV 84.9  PLT 296    Basic Metabolic Panel:  Lab Results  Component Value Date   NA 137 05/17/2023   K 4.0 05/17/2023   CO2 20 (L) 05/17/2023   GLUCOSE 145 (H) 05/17/2023   BUN 21 05/17/2023   CREATININE 0.80 05/17/2023   CALCIUM 9.0 05/17/2023   GFRNONAA >60 05/17/2023   GFRAA 77 07/01/2020   Lipid Panel:  Lab Results  Component Value Date   LDLCALC 32 08/19/2022   HgbA1c:  Lab Results  Component Value Date   HGBA1C 5.5 12/19/2022   Urine Drug Screen:     Component Value Date/Time   LABOPIA NONE DETECTED 05/17/2023 1000   COCAINSCRNUR NONE DETECTED 05/17/2023 1000   LABBENZ NONE DETECTED 05/17/2023 1000   AMPHETMU NONE DETECTED 05/17/2023 1000   THCU NONE DETECTED 05/17/2023 1000   LABBARB NONE DETECTED  05/17/2023 1000    Alcohol Level     Component Value Date/Time   ETH <10 05/17/2023 4098     I have reviewed the images obtained: CT Head without contrast 05/17/2023: No evidence of an acute intracranial abnormality. Mild chronic small vessel ischemic changes within the cerebral white matter.  Cerebral atrophy. Bilateral proptosis.   ASSESSMENT/PLAN: 89 year old female with history of dementia and epilepsy presented with breakthrough seizure-like episode.  Breakthrough seizure Hypomagnesemia Leukocytosis -Etiology: Reports medication compliance.  No clear infection.  Does have hypomagnesemia ( Mg 1.3)  Recommendations: -Will obtain video EEG to look for ictal interictal abnormality -Home lamotrigine: 25 mg twice daily.  Per son, patient continues to have breakthrough seizures therefore we will plan to increase to 50 mg at night and continue 25  mg during daytime for now.  If patient is unable to take p.o., I also ordered IV Vimpat -Continue seizure precautions -As needed IV Versed for clinical seizures -Discussed plan with sons at bedside, Dr. Chilton Si via secure chat  Thank you for allowing [place] to participate in the care of this patient. If you have any further questions, please contact  me or neurohospitalist.   Lindie Spruce Epile[facility] neurohospitalist

## 2023-05-17 NOTE — Assessment & Plan Note (Signed)
While the patient is n.p.o., add IV labetalol 10 mg every 4 hours as needed systolic blood pressure greater than 170 or diastolic greater than 100.

## 2023-05-17 NOTE — Hospital Course (Addendum)
Michelle [name] is a 89 y.o. female with a history of diabetes mellitus, seizures, hypertension, diastolic heart failure, wheelchair bound.  Patient presented secondary to seizure activity. Neurology consulted. Lamotrigine dose adjusted.

## 2023-05-17 NOTE — Assessment & Plan Note (Signed)
Stable.  Use nebulizer treatments while she is still altered.

## 2023-05-17 NOTE — Progress Notes (Signed)
Patient admitted to [address]. She is oriented to her self. IV site to her RF infiltrated. IV team consult placed. Call bell placed with reach of patient. Bed alarm activated. MD orders reviewed with patient's son. EEG tech called to place patient on continuous monitoring   05/17/23 1944  Vitals  Temp 97.9 F (36.6 C)  Temp Source Oral  BP (!) 144/90  MAP (mmHg) 108  BP Method Automatic  Pulse Rate 83  Pulse Rate Source Monitor  MEWS COLOR  MEWS Score Color Yellow  Oxygen Therapy  SpO2 99 %  MEWS Score  MEWS Temp 0  MEWS Systolic 0  MEWS Pulse 0  MEWS RR 2  MEWS LOC 0  MEWS Score 2

## 2023-05-17 NOTE — Assessment & Plan Note (Signed)
Admit to progressive neurology bed with telemetry for breakthrough seizures.  Neurology is managing her AEDs.  Will check a urinalysis to make sure that her seizures are not related to a UTI.  Keep the patient NPO.  IV fluids while NPO.  She has had aspiration risk since she has not returned to her neurologic baseline.

## 2023-05-17 NOTE — Progress Notes (Signed)
Pt moving rooms. Eeg to be put back online as schedule allows

## 2023-05-17 NOTE — Plan of Care (Signed)
°  Problem: Health Behavior/Discharge Planning: °Goal: Ability to manage health-related needs will improve °Outcome: Progressing °  °Problem: Skin Integrity: °Goal: Risk for impaired skin integrity will decrease °Outcome: Progressing °  °

## 2023-05-17 NOTE — Progress Notes (Signed)
LTM EEG hooked up and running - no initial skin breakdown.Pt is not monitored at this time they are in the ED. CT leads used.

## 2023-05-17 NOTE — Assessment & Plan Note (Signed)
Verified DNR/DNI with son [name] at bedside

## 2023-05-17 NOTE — Assessment & Plan Note (Signed)
Patient remains euvolemic.

## 2023-05-17 NOTE — ED Provider Notes (Signed)
[place] EMERGENCY DEPARTMENT AT [facility] Provider Note   CSN: [id number] Arrival date & time: 05/17/23  5284     History {Add pertinent medical, surgical, social history, OB history to HPI:1} Chief Complaint  Patient presents with   Seizures    Coming by EMS from home. Pt lives with son. Partial witnesses seizure. Hx of seizure. EMS reports pt has been compliant with seizure meds. Pt still posticle upon arrival. BGL 166.     Michelle [name] is a 89 y.o. female.  Patient is an 89 year old female with past medical history of seizure disorder on Lamictal presenting for seizures.  Patient is presenting with her son who lives with her who states he found her in her bed this morning snoring and difficult to arouse.  He states normally when she is this way she is postictal.  Last breakthrough seizure was 7 to 8 months ago.  He denies any signs or symptoms of infection including no fevers, chills, nausea, vomiting, coughing.  He denies any recent falls or head trauma.  On my way to evaluate the patient she started having myoclonic seizure seizure as I entered the room.    The history is provided by the patient. No language interpreter was used.  Seizures      Home Medications Prior to Admission medications   Medication Sig Start Date End Date Taking? Authorizing Provider  acetaminophen (TYLENOL) 650 MG CR tablet Take 1,300 mg by mouth 3 (three) times daily.   Yes [provider]  albuterol (PROVENTIL) (2.5 MG/3ML) 0.083% nebulizer solution USE THREE MILLILITERS VIA NEBULIZATION BY MOUTH EVERY 6 HOURS AS NEEDED FOR WHEEZING OR SHORTNESS OF BREATH Patient taking differently: Take 2.5 mg by nebulization every 6 (six) hours as needed for wheezing or shortness of breath. 10/12/21  Yes Shelva Majestic, MD  albuterol (VENTOLIN HFA) 108 (90 Base) MCG/ACT inhaler INHALE 1 PUFF EVERY 4 HOURS AS NEEDED FOR WHEEZING, SHORTNESS OF BREATH (RESCUE INHALER IF ADVAIR NOT  WORKING) Patient taking differently: Inhale 2 puffs into the lungs as needed. 03/29/23  Yes Shelva Majestic, MD  amLODipine (NORVASC) 2.5 MG tablet Take 2.5 mg in the morning. If afternoon blood pressure >145/90 take second tablet of 2.5 Patient taking differently: Take 2.5 mg by mouth See admin instructions. Take 1 tablet (2.5mg ) once a day. If afternoon BP is > 145/90 take a second dose of 2.5mg . 06/29/22  Yes Shelva Majestic, MD  aspirin 81 MG chewable tablet Chew 81 mg by mouth daily.   Yes [provider]  calcium carbonate (OSCAL) 1500 (600 Ca) MG TABS tablet Take 600 mg of elemental calcium by mouth daily.   Yes [provider]  carvedilol (COREG) 12.5 MG tablet Take 1 tablet (12.5 mg total) by mouth 2 (two) times daily. 12/06/22  Yes Alver Sorrow, NP  cholecalciferol (VITAMIN D3) 25 MCG (1000 UNIT) tablet Take 1,000 Units by mouth daily with breakfast.   Yes [provider]  clopidogrel (PLAVIX) 75 MG tablet TAKE 1 TABLET EVERY DAY Patient taking differently: Take 75 mg by mouth daily. 11/23/22  Yes Shelva Majestic, MD  diclofenac Sodium (VOLTAREN) 1 % GEL Apply 2 g topically 4 (four) times daily. 03/22/23  Yes Shelva Majestic, MD  enalapril (VASOTEC) 20 MG tablet Take 1 tablet (20 mg total) by mouth at bedtime. Patient taking differently: Take 20 mg by mouth at bedtime. 03/23/23  Yes Alver Sorrow, NP  fenofibrate micronized (LOFIBRA) 134 MG  capsule TAKE 1 CAPSULE EVERY DAY 12/27/22  Yes Shelva Majestic, MD  ferrous sulfate 325 (65 FE) MG tablet Twice a week Patient taking differently: Take 325 mg by mouth 2 (two) times a week. Tuesday  and Friday 02/21/20  Yes Shelva Majestic, MD  fluticasone-salmeterol (ADVAIR) 250-50 MCG/ACT AEPB INHALE 1 PUFF TWICE DAILY 09/12/22  Yes Shelva Majestic, MD  folic acid (FOLVITE) 400 MCG tablet Take 400 mcg by mouth at bedtime.   Yes [provider]  furosemide (LASIX) 20 MG tablet Take 1 tablet (20 mg total)  by mouth as needed for fluid or edema. 03/23/23  Yes Alver Sorrow, NP  icosapent Ethyl (VASCEPA) 1 g capsule Take 2 capsules (2 g total) by mouth 2 (two) times daily. Patient taking differently: Take 1-2 g by mouth See admin instructions. Take 1 g gram in the morning and afternoon. Take 2 g at bedtime 12/06/22  Yes Alver Sorrow, NP  insulin glargine (LANTUS SOLOSTAR) 100 UNIT/ML Solostar Pen INJECT 17 TO 20 UNITS UNDER THE SKIN EVERY MORNING Patient taking differently: Inject 20 Units into the skin every morning. 12/08/22  Yes Shelva Majestic, MD  lamoTRIgine (LAMICTAL) 25 MG tablet TAKE 1 TABLET TWICE DAILY 03/22/23  Yes Van Clines, MD  loperamide (IMODIUM A-D) 2 MG tablet Take 2-4 mg by mouth 2 (two) times daily as needed for diarrhea or loose stools.   Yes [provider]  loratadine (CLARITIN) 10 MG tablet Take 10 mg by mouth every other day.   Yes [provider]  medroxyPROGESTERone (PROVERA) 10 MG tablet Take 1 tablet (10 mg total) by mouth daily. Patient taking differently: Take 10 mg by mouth at bedtime. 09/12/22  Yes Cross, Melissa D, NP  Melatonin 5 MG CAPS Take 5 mg by mouth at bedtime.   Yes [provider]  nitroGLYCERIN (NITROSTAT) 0.4 MG SL tablet DISSOLVE 1 TAB UNDER TONGUE FOR CHEST PAIN - IF PAIN REMAINS AFTER 5 MIN, CALL 911 AND REPEAT DOSE. MAX 3 TABS IN 15 MINUTES Patient taking differently: Place 0.4 mg under the tongue every 5 (five) minutes as needed for chest pain. DISSOLVE 1 TAB UNDER TONGUE FOR CHEST PAIN - IF PAIN REMAINS AFTER 5 MIN, CALL 911 AND REPEAT DOSE. MAX 3 TABS IN 15 MINUTES 01/17/23  Yes Chilton Si, MD  nortriptyline (PAMELOR) 10 MG capsule TAKE 1 CAPSULE BY MOUTH AT BEDTIME AS NEEDED FOR PAIN PREVENTION Patient taking differently: Take 10 mg by mouth at bedtime. 04/12/23  Yes Rodolph Bong, MD  pantoprazole (PROTONIX) 40 MG tablet TAKE 1 TABLET EVERY DAY 05/02/23  Yes Shelva Majestic, MD  psyllium (METAMUCIL) 58.6 %  packet Take 1 packet by mouth as needed (fiber).   Yes [provider]  rosuvastatin (CRESTOR) 40 MG tablet TAKE 1 TABLET EVERY DAY 08/08/22  Yes Shelva Majestic, MD  sertraline (ZOLOFT) 50 MG tablet TAKE 2 TABLETS AT BEDTIME Patient taking differently: Take 100 mg by mouth at bedtime. 10/13/22  Yes Shelva Majestic, MD  SPIRIVA HANDIHALER 18 MCG inhalation capsule PLACE 1 CAPSULE INTO HANDIHALER AND INHALE THE CONTENTS DAILY Patient taking differently: Place 18 mcg into inhaler and inhale daily. 11/23/22  Yes Shelva Majestic, MD  vitamin B-12 (CYANOCOBALAMIN) 1000 MCG tablet Take 1,000 mcg by mouth daily with breakfast.   Yes [provider]  vitamin E 400 UNIT capsule Take 400 Units by mouth at bedtime.   Yes [provider]  blood glucose  meter kit and supplies KIT Dispense based on patient and insurance preference. Use up to four times daily as directed. Dx:E11.9 04/01/21   Shelva Majestic, MD  hydrALAZINE (APRESOLINE) 25 MG tablet Take one tablet AS NEEDED for systolic BP more than 160. May take up to twice per day as needed. Patient not taking: Reported on 05/17/2023 03/23/23   Alver Sorrow, NP  Insulin Pen Needle (DROPLET PEN NEEDLES) 31G X 8 MM MISC USE AS DIRECTED WITH LANTUS SOLOSTAR PEN. Dx. E11.9 01/20/23   Shelva Majestic, MD  Lancets Misc. (ACCU-CHEK FASTCLIX LANCET) KIT 1 Device by Does not apply route. Use as directed for testing blood sugar    [provider]  Lancets Misc. (ACCU-CHEK FASTCLIX LANCET) KIT Use as directed to test blood sugar 11/13/20   Shelva Majestic, MD  mirtazapine (REMERON) 7.5 MG tablet Take 7.5 mg by mouth at bedtime. Patient not taking: Reported on 05/17/2023    [provider]  nitrofurantoin, macrocrystal-monohydrate, (MACROBID) 100 MG capsule Take 1 capsule (100 mg total) by mouth 2 (two) times daily. Patient not taking: Reported on 05/17/2023 04/13/23   Ardith Dark, MD  spironolactone (ALDACTONE) 25 MG  tablet  03/23/23   [provider]  TRUE METRIX BLOOD GLUCOSE TEST test strip TEST BLOOD SUGAR UP TO FOUR TIMES DAILY AS DIRECTED 04/17/23   Shelva Majestic, MD  TRUEplus Lancets 33G MISC TEST BLOOD SUGAR UP TO FOUR TIMES DAILY AS DIRECTED 04/17/23   Shelva Majestic, MD      Allergies    Codeine sulfate and Morphine sulfate    Review of Systems   Review of Systems  Unable to perform ROS: Patient unresponsive  Neurological:  Positive for seizures.    Physical Exam Updated Vital Signs BP (!) 164/72   Pulse 88   Temp 98 F (36.7 C)   Resp (!) 27   Wt 77.1 kg   SpO2 95%   BMI 28.29 kg/m  Physical Exam Constitutional:      Comments: Unresponsive  Eyes:     Pupils: Pupils are equal, round, and reactive to light.     Comments: Exophthalmos left eye  Cardiovascular:     Rate and Rhythm: Normal rate and regular rhythm.  Pulmonary:     Effort: Pulmonary effort is normal.     Breath sounds: Normal breath sounds.  Chest:     Chest wall: No swelling or crepitus.  Abdominal:     Palpations: Abdomen is soft.  Skin:    General: Skin is warm.     Capillary Refill: Capillary refill takes less than 2 seconds.     Findings: No rash.  Neurological:     GCS: GCS eye subscore is 1. GCS verbal subscore is 1. GCS motor subscore is 4.     ED Results / Procedures / Treatments   Labs (all labs ordered are listed, but only abnormal results are displayed) Labs Reviewed  CBC WITH DIFFERENTIAL/PLATELET - Abnormal; Notable for the following components:      Result Value   Hemoglobin 11.8 (*)    All other components within normal limits  BASIC METABOLIC PANEL - Abnormal; Notable for the following components:   CO2 20 (*)    Glucose, Bld 145 (*)    All other components within normal limits  MAGNESIUM - Abnormal; Notable for the following components:   Magnesium 1.3 (*)    All other components within normal limits  CK - Abnormal; Notable for  the following components:   Total CK  37 (*)    All other components within normal limits  CBG MONITORING, ED - Abnormal; Notable for the following components:   Glucose-Capillary 152 (*)    All other components within normal limits  RESP PANEL BY RT-PCR (RSV, FLU A&B, COVID)  [id number]  RAPID URINE DRUG SCREEN, HOSP PERFORMED  ETHANOL  LAMOTRIGINE LEVEL    EKG EKG Interpretation Date/Time:  Wednesday May 17 2023 09:09:05 EDT Ventricular Rate:  99 PR Interval:  216 QRS Duration:  98 QT Interval:  366 QTC Calculation: 470 R Axis:   -48  Text Interpretation: Sinus rhythm Prolonged PR interval Left anterior fascicular block Borderline T wave abnormalities Confirmed by Edwin Dada ([id number]) on 05/17/2023 11:46:52 AM  Radiology CT Head Wo Contrast  Result Date: 05/17/2023 CLINICAL DATA:  Provided history: Altered mental status, nontraumatic. Seizures. EXAM: CT HEAD WITHOUT CONTRAST TECHNIQUE: Contiguous axial images were obtained from the base of the skull through the vertex without intravenous contrast. RADIATION DOSE REDUCTION: This exam was performed according to the departmental dose-optimization program which includes automated exposure control, adjustment of the mA and/or kV according to patient size and/or use of iterative reconstruction technique. COMPARISON:  Head CT 03/01/2023. FINDINGS: Brain: Generalized cerebral atrophy. Patchy and ill-defined hypoattenuation within the cerebral white matter, nonspecific but compatible with mild chronic small vessel ischemic disease. There is no acute intracranial hemorrhage. No demarcated cortical infarct. No extra-axial fluid collection. No evidence of an intracranial mass. No midline shift. Vascular: No hyperdense vessel.  Atherosclerotic calcifications. Skull: No calvarial fracture or aggressive osseous lesion. Sinuses/Orbits: Bilateral proptosis. Chronic fracture deformity of the right orbital floor. No significant paranasal sinus disease. IMPRESSION: 1. No evidence of an acute  intracranial abnormality. 2. Mild chronic small vessel ischemic changes within the cerebral white matter. 3. Cerebral atrophy. 4. Bilateral proptosis. Electronically Signed   By: Jackey Loge D.O.   On: 05/17/2023 11:48   DG Chest Portable 1 View  Result Date: 05/17/2023 CLINICAL DATA:  89 year old female with cough, shortness of breath, seizure. EXAM: PORTABLE CHEST 1 VIEW COMPARISON:  Portable chest 02/15/2023 and earlier. FINDINGS: Portable AP upright view at 0939 hours. Lower lung volumes. Calcified aortic atherosclerosis. Stable cardiomegaly and mediastinal contours. Questionable rightward tracheal or gas containing esophagus adeviation compared to a July chest CT, and also a cervical spine CT 03/01/2023. The patient's head is turned to the left. Chronic asymmetrically increased density at the right thoracic inlet is related to tortuous brachiocephalic artery and great vessels on the prior CT. No pneumothorax, pleural effusion, consolidation. Coarse bilateral pulmonary interstitial markings appear stable. Paucity of bowel gas. Stable cholecystectomy clips. No acute osseous abnormality identified. IMPRESSION: 1.  No acute cardiopulmonary abnormality. 2. Probably gas containing nondilated esophagus, rather than trachea, which is deviated to the right at the thoracic inlet. Significance is doubtful. 3.  Aortic Atherosclerosis (ICD10-I70.0). Electronically Signed   By: Odessa Fleming M.D.   On: 05/17/2023 09:48    Procedures .Critical Care  Performed by: Franne Forts, DO Authorized by: Franne Forts, DO   Critical care provider statement:    Critical care time (minutes):  75   Critical care was necessary to treat or prevent imminent or life-threatening deterioration of the following conditions: Status epilepticus.   Critical care was time spent personally by me on the following activities:  Development of treatment plan with patient or surrogate, discussions with consultants, evaluation of patient's  response to treatment, examination of patient, ordering and  review of laboratory studies, ordering and review of radiographic studies, ordering and performing treatments and interventions, pulse oximetry, re-evaluation of patient's condition and review of old charts   Care discussed with: admitting provider     Care discussed with comment:  Neurology   {Document cardiac monitor, telemetry assessment procedure when appropriate:1}  Medications Ordered in ED Medications  magnesium sulfate IVPB 2 g 50 mL (has no administration in time range)  lamoTRIgine (LAMICTAL) tablet 50 mg (has no administration in time range)    Or  lacosamide (VIMPAT) 50 mg in sodium chloride 0.9 % 25 mL IVPB (has no administration in time range)  lamoTRIgine (LAMICTAL) tablet 25 mg (has no administration in time range)    Or  lacosamide (VIMPAT) 50 mg in sodium chloride 0.9 % 25 mL IVPB (has no administration in time range)  LORazepam (ATIVAN) injection 2 mg (2 mg Intravenous Given 05/17/23 0930)  levETIRAcetam (KEPPRA) IVPB 1500 mg/ 100 mL premix (0 mg Intravenous Stopped 05/17/23 1119)    ED Course/ Medical Decision Making/ A&P   {   Click here for ABCD2, HEART and other calculatorsREFRESH Note before signing :1}                              Medical Decision Making Amount and/or Complexity of Data Reviewed Labs: ordered. Radiology: ordered.  Risk Prescription drug management. Decision regarding hospitalization.   89 year old female past medical history of seizure disorder on Lamictal presenting for seizure.  Son states that he found her in a postictal state this morning.  On arrival to emergency department she had full myoclonic seizure.  Ativan given.  Keppra 1500 mg given IV.  Lamictal level sent.  Revisiting patient, no improvement of mental status.  GCS of 6.  Airway is protected at this time.  No hypoxia.  Stable respiratory rate.  Stable laboratory studies.  Minimal hypomagnesemia present.  Magnesium  replaced.  CT head demonstrates no acute process.  No signs or symptoms of sepsis.  Ethanol negative.  UDS negative..  No improvement of patient's mental status on reevaluation.  Neurologist Dr. Melynda Ripple at bedside.  Request hospitalist admit for status epilepticus with neurology on consult for prolonged postictal state.      {Document critical care time when appropriate:1} {Document review of labs and clinical decision tools ie heart score, Chads2Vasc2 etc:1}  {Document your independent review of radiology images, and any outside records:1} {Document your discussion with family members, caretakers, and with consultants:1} {Document social determinants of health affecting pt's care:1} {Document your decision making why or why not admission, treatments were needed:1} Final Clinical Impression(s) / ED Diagnoses Final diagnoses:  Seizure (HCC)  Status epilepticus (HCC)    Rx / DC Orders ED Discharge Orders     None

## 2023-05-17 NOTE — Progress Notes (Signed)
Third time calling eeg tech. Tech says he will be here 1.5 hrs time

## 2023-05-17 NOTE — Telephone Encounter (Signed)
Called and lm on [place] vm with VO.

## 2023-05-17 NOTE — Assessment & Plan Note (Signed)
Chronic.  Son Michelle [name] states that patient walks minimally at home.

## 2023-05-17 NOTE — H&P (Addendum)
History and Physical    SHATEARA TIPPIE MWN:[id number] DOB: 1933-01-01 DOA: 05/17/2023  DOS: the patient was seen and examined on 05/17/2023  PCP: Shelva Majestic, MD   Patient coming from: Home  I have personally briefly reviewed patient's old medical records in [place] [facility]  CC: seizure HPI: 89 year old Caucasian female history of seizures, type 2 diabetes, hypertension, chronic diastolic heart failure, wheelchair bound, presents to the ER today with a seizure at home.  Patient lives with her son [name].  Patient does not manage any of her medications.  This is performed by the family.  Son found the patient having a seizure this morning.  Last known normal was last night.  Was still having altered mental status on presentation.  Had a myoclonic seizure in the ER.  was given IV Ativan in the ER.  Patient seen by neurology.  She was loaded with IV Keppra.  She is normally on Lamictal 25 mg twice daily.  Patient is been seen by neurology.  Overnight EEG has been ordered.  [facility] hospitalist consulted for admission.  Discussed the case with the patient's son [name].  Patient has the MOST form and a DNR form that was completed in September 2024.  Reviewed this with the patient's son [name].  Patient will be DNR/DNI per her wishes.   ED Course: loaded with IV keppra, given IV ativan  Review of Systems:  Review of Systems  Unable to perform ROS: Other  Pt remains post-ictal  Past Medical History:  Diagnosis Date   Anginal pain (HCC)    Arthritis    AV block, 1st degree    CAD (coronary artery disease) 1990; 2015   Cardiac cath 1990 with Dr. Riley Kill and [name] Residual CAD, non obstructive per 2015 cath. STEMI status post stent in August 2015.   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   Promus DES to RCA with STEMI   Shortness of breath     Past Surgical History:  Procedure Laterality Date   CATARACT EXTRACTION  2020   right and left eye    CHOLECYSTECTOMY     thinks her appendix was removed at the same time   CORONARY ANGIOPLASTY WITH STENT PLACEMENT  02/18/14   Promus DES to RCA   LEFT HEART CATHETERIZATION WITH CORONARY ANGIOGRAM N/A 02/18/2014   Procedure: LEFT HEART CATHETERIZATION WITH CORONARY ANGIOGRAM;  Surgeon: Corky Crafts, MD;  Location[facility] C[facility] LAB;  Service: Cardiovascular;  Laterality: N/A;   PTCA  1990   PTCA of RCA   TRANSTHORACIC ECHOCARDIOGRAM  02/02/2012   mild LVH, EF 55-60%, Normal WM, Gr 1 DD; Mild MR     reports that she quit smoking about 16 years ago. Her smoking use included cigarettes. She started smoking about 71 years ago. She has a 55 pack-year smoking history. She has never used smokeless tobacco. She reports that she does not drink alcohol and does not use drugs.  Allergies  Allergen Reactions   Codeine Sulfate Nausea Only  Morphine Sulfate Nausea Only    Family History  Problem Relation Age of Onset   Heart attack Mother 35   Cancer Father 57   Cancer Maternal Grandfather    Cancer Paternal Grandmother    Cancer Paternal Grandfather    Diabetes Son    Liver cancer Brother    Bone cancer Brother    Heart attack Son    Hypertension Son    Diabetes Son    Hyperlipidemia Son    Colon cancer Neg Hx    Ovarian cancer Neg Hx    Uterine cancer Neg Hx     Prior to Admission medications   Medication Sig Start Date End Date Taking? Authorizing Provider  acetaminophen (TYLENOL) 650 MG CR tablet Take 1,300 mg by mouth 3 (three) times daily.   Yes  [provider]  albuterol (PROVENTIL) (2.5 MG/3ML) 0.083% nebulizer solution USE THREE MILLILITERS VIA NEBULIZATION BY MOUTH EVERY 6 HOURS AS NEEDED FOR WHEEZING OR SHORTNESS OF BREATH Patient taking differently: Take 2.5 mg by nebulization every 6 (six) hours as needed for wheezing or shortness of breath. 10/12/21  Yes Shelva Majestic, MD  albuterol (VENTOLIN HFA) 108 (90 Base) MCG/ACT inhaler INHALE 1 PUFF EVERY 4 HOURS AS NEEDED FOR WHEEZING, SHORTNESS OF BREATH (RESCUE INHALER IF ADVAIR NOT WORKING) Patient taking differently: Inhale 2 puffs into the lungs as needed. 03/29/23  Yes Shelva Majestic, MD  amLODipine (NORVASC) 2.5 MG tablet Take 2.5 mg in the morning. If afternoon blood pressure >145/90 take second tablet of 2.5 Patient taking differently: Take 2.5 mg by mouth See admin instructions. Take 1 tablet (2.5mg ) once a day. If afternoon BP is > 145/90 take a second dose of 2.5mg . 06/29/22  Yes Shelva Majestic, MD  aspirin 81 MG chewable tablet Chew 81 mg by mouth daily.   Yes [provider]  calcium carbonate (OSCAL) 1500 (600 Ca) MG TABS tablet Take 600 mg of elemental calcium by mouth daily.   Yes [provider]  carvedilol (COREG) 12.5 MG tablet Take 1 tablet (12.5 mg total) by mouth 2 (two) times daily. 12/06/22  Yes Alver Sorrow, NP  cholecalciferol (VITAMIN D3) 25 MCG (1000 UNIT) tablet Take 1,000 Units by mouth daily with breakfast.   Yes [provider]  clopidogrel (PLAVIX) 75 MG tablet TAKE 1 TABLET EVERY DAY Patient taking differently: Take 75 mg by mouth daily. 11/23/22  Yes Shelva Majestic, MD  diclofenac Sodium (VOLTAREN) 1 % GEL Apply 2 g topically 4 (four) times daily. 03/22/23  Yes Shelva Majestic, MD  enalapril (VASOTEC) 20 MG tablet Take 1 tablet (20 mg total) by mouth at bedtime. Patient taking differently: Take 20 mg by mouth at bedtime. 03/23/23  Yes Alver Sorrow, NP  fenofibrate micronized (LOFIBRA) 134 MG capsule TAKE  1 CAPSULE EVERY DAY 12/27/22  Yes Shelva Majestic, MD  ferrous sulfate 325 (65 FE) MG tablet Twice a week Patient taking differently: Take 325 mg by mouth 2 (two) times a week. Tuesday  and Friday 02/21/20  Yes Shelva Majestic, MD  fluticasone-salmeterol (ADVAIR) 250-50 MCG/ACT AEPB INHALE 1 PUFF TWICE DAILY 09/12/22  Yes Shelva Majestic, MD  folic acid (FOLVITE) 400 MCG tablet Take 400 mcg by mouth at bedtime.   Yes [provider]  furosemide (LASIX) 20 MG tablet Take 1 tablet (20 mg total) by mouth as needed for fluid or edema. 03/23/23  Yes Alver Sorrow, NP  icosapent Ethyl (VASCEPA) 1  g capsule Take 2 capsules (2 g total) by mouth 2 (two) times daily. Patient taking differently: Take 1-2 g by mouth See admin instructions. Take 1 g gram in the morning and afternoon. Take 2 g at bedtime 12/06/22  Yes Alver Sorrow, NP  insulin glargine (LANTUS SOLOSTAR) 100 UNIT/ML Solostar Pen INJECT 17 TO 20 UNITS UNDER THE SKIN EVERY MORNING Patient taking differently: Inject 20 Units into the skin every morning. 12/08/22  Yes Shelva Majestic, MD  lamoTRIgine (LAMICTAL) 25 MG tablet TAKE 1 TABLET TWICE DAILY 03/22/23  Yes Van Clines, MD  loperamide (IMODIUM A-D) 2 MG tablet Take 2-4 mg by mouth 2 (two) times daily as needed for diarrhea or loose stools.   Yes [provider]  loratadine (CLARITIN) 10 MG tablet Take 10 mg by mouth every other day.   Yes [provider]  medroxyPROGESTERone (PROVERA) 10 MG tablet Take 1 tablet (10 mg total) by mouth daily. Patient taking differently: Take 10 mg by mouth at bedtime. 09/12/22  Yes Cross, Melissa D, NP  Melatonin 5 MG CAPS Take 5 mg by mouth at bedtime.   Yes [provider]  nitroGLYCERIN (NITROSTAT) 0.4 MG SL tablet DISSOLVE 1 TAB UNDER TONGUE FOR CHEST PAIN - IF PAIN REMAINS AFTER 5 MIN, CALL 911 AND REPEAT DOSE. MAX 3 TABS IN 15 MINUTES Patient taking differently: Place 0.4 mg under the tongue every 5 (five)  minutes as needed for chest pain. DISSOLVE 1 TAB UNDER TONGUE FOR CHEST PAIN - IF PAIN REMAINS AFTER 5 MIN, CALL 911 AND REPEAT DOSE. MAX 3 TABS IN 15 MINUTES 01/17/23  Yes Chilton Si, MD  nortriptyline (PAMELOR) 10 MG capsule TAKE 1 CAPSULE BY MOUTH AT BEDTIME AS NEEDED FOR PAIN PREVENTION Patient taking differently: Take 10 mg by mouth at bedtime. 04/12/23  Yes Rodolph Bong, MD  pantoprazole (PROTONIX) 40 MG tablet TAKE 1 TABLET EVERY DAY 05/02/23  Yes Shelva Majestic, MD  psyllium (METAMUCIL) 58.6 % packet Take 1 packet by mouth as needed (fiber).   Yes [provider]  sertraline (ZOLOFT) 50 MG tablet TAKE 2 TABLETS AT BEDTIME Patient taking differently: Take 100 mg by mouth at bedtime. 10/13/22  Yes Shelva Majestic, MD  SPIRIVA HANDIHALER 18 MCG inhalation capsule PLACE 1 CAPSULE INTO HANDIHALER AND INHALE THE CONTENTS DAILY Patient taking differently: Place 18 mcg into inhaler and inhale daily. 11/23/22  Yes Shelva Majestic, MD  vitamin B-12 (CYANOCOBALAMIN) 1000 MCG tablet Take 1,000 mcg by mouth daily with breakfast.   Yes [provider]  vitamin E 400 UNIT capsule Take 400 Units by mouth at bedtime.   Yes [provider]  blood glucose meter kit and supplies KIT Dispense based on patient and insurance preference. Use up to four times daily as directed. Dx:E11.9 04/01/21   Shelva Majestic, MD  hydrALAZINE (APRESOLINE) 25 MG tablet Take one tablet AS NEEDED for systolic BP more than 160. May take up to twice per day as needed. Patient not taking: Reported on 05/17/2023 03/23/23   Alver Sorrow, NP  Insulin Pen Needle (DROPLET PEN NEEDLES) 31G X 8 MM MISC USE AS DIRECTED WITH LANTUS SOLOSTAR PEN. Dx. E11.9 01/20/23   Shelva Majestic, MD  Lancets Misc. (ACCU-CHEK FASTCLIX LANCET) KIT 1 Device by Does not apply route. Use as directed for testing blood sugar    [provider]  Lancets Misc. (ACCU-CHEK FASTCLIX LANCET) KIT Use as directed to test  blood sugar  11/13/20   Shelva Majestic, MD  mirtazapine (REMERON) 7.5 MG tablet Take 7.5 mg by mouth at bedtime. Patient not taking: Reported on 05/17/2023    [provider]  nitrofurantoin, macrocrystal-monohydrate, (MACROBID) 100 MG capsule Take 1 capsule (100 mg total) by mouth 2 (two) times daily. Patient not taking: Reported on 05/17/2023 04/13/23   Ardith Dark, MD  rosuvastatin (CRESTOR) 40 MG tablet Take 1 tablet (40 mg total) by mouth daily. 05/17/23   Shelva Majestic, MD  spironolactone (ALDACTONE) 25 MG tablet  03/23/23   [provider]  TRUE METRIX BLOOD GLUCOSE TEST test strip TEST BLOOD SUGAR UP TO FOUR TIMES DAILY AS DIRECTED 04/17/23   Shelva Majestic, MD  TRUEplus Lancets 33G MISC TEST BLOOD SUGAR UP TO FOUR TIMES DAILY AS DIRECTED 04/17/23   Shelva Majestic, MD    Physical Exam: Vitals:   05/17/23 1345 05/17/23 1400 05/17/23 1415 05/17/23 1430  BP: (!) 158/118 (!) 183/91 (!) 184/91 (!) 188/90  Pulse: 91 90 89 94  Resp: (!) 30 (!) 25 20 17   Temp:      TempSrc:      SpO2: 100% 100% 100% 100%  Weight:        Physical Exam Vitals and nursing note reviewed.  Constitutional:      General: She is not in acute distress.    Appearance: She is not toxic-appearing.     Comments: Appears chronically ill.  No respiratory distress.  HENT:     Head: Normocephalic and atraumatic.  Eyes:     Comments: Bilateral artificial lens  Cardiovascular:     Rate and Rhythm: Normal rate and regular rhythm.  Pulmonary:     Effort: Pulmonary effort is normal. No respiratory distress.  Abdominal:     General: Bowel sounds are normal. There is no distension.     Palpations: Abdomen is soft.  Musculoskeletal:     Right lower leg: No edema.     Left lower leg: No edema.  Skin:    General: Skin is warm and dry.  Neurological:     Comments: Moving her head back-and-forth spontaneously. Not responsive to verbal stimuli.      Labs on Admission: I have  personally reviewed following labs and imaging studies  CBC: Recent Labs  Lab 05/17/23 0918  WBC 7.6  NEUTROABS 5.8  HGB 11.8*  HCT 38.3  MCV 84.9  PLT 296   Basic Metabolic Panel: Recent Labs  Lab 05/17/23 0918  NA 137  K 4.0  CL 105  CO2 20*  GLUCOSE 145*  BUN 21  CREATININE 0.80  CALCIUM 9.0  MG 1.3*   GFR: Estimated Creatinine Clearance: 49.9 mL/min (by C-G formula based on SCr of 0.8 mg/dL). Cardiac Enzymes: Recent Labs  Lab 05/17/23 0918  CKTOTAL 37*    CBG: Recent Labs  Lab 05/17/23 0916  GLUCAP 152*   Radiological Exams on Admission: I have personally reviewed images CT Head Wo Contrast  Result Date: 05/17/2023 CLINICAL DATA:  Provided history: Altered mental status, nontraumatic. Seizures. EXAM: CT HEAD WITHOUT CONTRAST TECHNIQUE: Contiguous axial images were obtained from the base of the skull through the vertex without intravenous contrast. RADIATION DOSE REDUCTION: This exam was performed according to the departmental dose-optimization program which includes automated exposure control, adjustment of the mA and/or kV according to patient size and/or use of iterative reconstruction technique. COMPARISON:  Head CT 03/01/2023. FINDINGS: Brain: Generalized cerebral atrophy. Patchy and ill-defined hypoattenuation within the cerebral white  matter, nonspecific but compatible with mild chronic small vessel ischemic disease. There is no acute intracranial hemorrhage. No demarcated cortical infarct. No extra-axial fluid collection. No evidence of an intracranial mass. No midline shift. Vascular: No hyperdense vessel.  Atherosclerotic calcifications. Skull: No calvarial fracture or aggressive osseous lesion. Sinuses/Orbits: Bilateral proptosis. Chronic fracture deformity of the right orbital floor. No significant paranasal sinus disease. IMPRESSION: 1. No evidence of an acute intracranial abnormality. 2. Mild chronic small vessel ischemic changes within the cerebral  white matter. 3. Cerebral atrophy. 4. Bilateral proptosis. Electronically Signed   By: Jackey Loge D.O.   On: 05/17/2023 11:48   DG Chest Portable 1 View  Result Date: 05/17/2023 CLINICAL DATA:  89 year old female with cough, shortness of breath, seizure. EXAM: PORTABLE CHEST 1 VIEW COMPARISON:  Portable chest 02/15/2023 and earlier. FINDINGS: Portable AP upright view at 0939 hours. Lower lung volumes. Calcified aortic atherosclerosis. Stable cardiomegaly and mediastinal contours. Questionable rightward tracheal or gas containing esophagus adeviation compared to a July chest CT, and also a cervical spine CT 03/01/2023. The patient's head is turned to the left. Chronic asymmetrically increased density at the right thoracic inlet is related to tortuous brachiocephalic artery and great vessels on the prior CT. No pneumothorax, pleural effusion, consolidation. Coarse bilateral pulmonary interstitial markings appear stable. Paucity of bowel gas. Stable cholecystectomy clips. No acute osseous abnormality identified. IMPRESSION: 1.  No acute cardiopulmonary abnormality. 2. Probably gas containing nondilated esophagus, rather than trachea, which is deviated to the right at the thoracic inlet. Significance is doubtful. 3.  Aortic Atherosclerosis (ICD10-I70.0). Electronically Signed   By: Odessa Fleming M.D.   On: 05/17/2023 09:48    EKG: My personal interpretation of EKG shows: NSR, 1st degree AVB    Assessment/Plan Principal Problem:   Breakthrough seizure (HCC) Active Problems:   Type 2 diabetes mellitus without complication, with long-term current use of insulin (HCC)   Hyperlipidemia associated with type 2 diabetes mellitus (HCC)   Essential hypertension   COPD (chronic obstructive pulmonary disease) (HCC)   Chronic diastolic CHF (congestive heart failure) (HCC)   Wheelchair dependence   DNR (do not resuscitate)/DNI(Do Not Intubate)   Hypomagnesemia    Assessment and Plan: * Breakthrough seizure  (HCC) Admit to progressive neurology bed with telemetry for breakthrough seizures.  Neurology is managing her AEDs.  Will check a urinalysis to make sure that her seizures are not related to a UTI.  Keep the patient NPO.  IV fluids while NPO.  She has had aspiration risk since she has not returned to her neurologic baseline.  Wheelchair dependence Chronic.  Son [name] states that patient walks minimally at home.  Chronic diastolic CHF (congestive heart failure) (HCC) Patient remains euvolemic.  COPD (chronic obstructive pulmonary disease) (HCC) Stable.  Use nebulizer treatments while she is still altered.  Essential hypertension While the patient is n.p.o., add IV labetalol 10 mg every 4 hours as needed systolic blood pressure greater than 170 or diastolic greater than 100.  Hyperlipidemia associated with type 2 diabetes mellitus (HCC) Stable.  Hold statin while NPO.  Type 2 diabetes mellitus without complication, with long-term current use of insulin (HCC) Place patient on sliding scale.  Check A1c.  Hypomagnesemia Repleted with IV Mg in ER. Repeat Mg level in AM.  DNR (do not resuscitate)/DNI(Do Not Intubate) Verified DNR/DNI with son [name] at bedside          DVT prophylaxis: SQ Heparin Code Status: DNR/DNI(Do NOT Intubate) Family Communication: discussed with pt's son [name]  at bedside  Disposition Plan: return home  Consults called: EDP has consulted neurology. Neurology consult on chart  Admission status: Observation, Telemetry bed   Carollee Herter, DO [facility] 05/17/2023, 2:53 PM

## 2023-05-17 NOTE — Assessment & Plan Note (Signed)
Place patient on sliding scale.  Check A1c.

## 2023-05-17 NOTE — Assessment & Plan Note (Signed)
Repleted with IV Mg in ER. Repeat Mg level in AM.

## 2023-05-17 NOTE — Progress Notes (Signed)
EEG tech called the second time. Michelle [name] he is  on the way

## 2023-05-17 NOTE — ED Notes (Signed)
ED TO INPATIENT HANDOFF REPORT  ED Nurse Name and Phone #: [phone]  S Name/Age/Gender Michelle [name] 89 y.o. female Room/Bed: 022C/022C  Code Status   Code Status: Limited: Do not attempt resuscitation (DNR) -DNR-LIMITED -Do Not Intubate/DNI   Home/SNF/Other Home Patient oriented to: Is this baseline? No   Triage Complete: Triage complete  Chief Complaint Breakthrough seizure (HCC) [G40.919]  Triage Note No notes on file   Allergies Allergies  Allergen Reactions   Codeine Sulfate Nausea Only   Morphine Sulfate Nausea Only    Level of Care/Admitting Diagnosis ED Disposition     ED Disposition  Admit   Condition  --   Comment  Hospital Area: [facility] [[id number]]  Level of Care: Progressive [102]  Admit to Progressive based on following criteria: NEUROLOGICAL AND NEUROSURGICAL complex patients with significant risk of instability, who do not meet ICU criteria, yet require close observation or frequent assessment (< / = every 2 - 4 hours) with medical / nursing intervention.  May place patient in observation at [facility] or [facility] if equivalent level of care is available:: No  Covid Evaluation: Asymptomatic - no recent exposure (last 10 days) testing not required  Diagnosis: Breakthrough seizure [facility]) [[id number]]  Admitting Physician: Imogene Burn ERIC [[id number]]  Attending Physician: Imogene Burn, ERIC [[id number]]          B Medical/Surgery History Past Medical History:  Diagnosis Date   Anginal pain (HCC)    Arthritis    AV block, 1st degree    CAD (coronary artery disease) 1990; 2015   Cardiac cath 1990 with Dr. Riley Kill and [name] Residual CAD, non obstructive per 2015 cath. STEMI status post stent in August 2015.   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   Promus DES to RCA with STEMI   Shortness of breath    Past Surgical History:  Procedure Laterality Date   CATARACT EXTRACTION  2020   right and left eye    CHOLECYSTECTOMY     thinks her appendix was removed at the same time   CORONARY ANGIOPLASTY WITH STENT PLACEMENT  02/18/14   Promus DES to RCA   LEFT HEART CATHETERIZATION WITH CORONARY ANGIOGRAM N/A 02/18/2014   Procedure: LEFT HEART CATHETERIZATION WITH CORONARY ANGIOGRAM;  Surgeon: Corky Crafts, MD;  Location: [facility] CATH LAB;  Service: Cardiovascular;  Laterality: N/A;   PTCA  1990   PTCA of RCA   TRANSTHORACIC ECHOCARDIOGRAM  02/02/2012   mild LVH, EF 55-60%, Normal WM, Gr 1 DD; Mild MR     A IV Location/Drains/Wounds Patient Lines/Drains/Airways Status     Active Line/Drains/Airways     Name Placement date Placement time Site Days   Peripheral IV 02/18/23 22 G 1" Left;Posterior Forearm 02/18/23  0920  Forearm  88   Peripheral IV 05/17/23 20 G Left Antecubital 05/17/23  0915  Antecubital  less than 1   Urethral Catheter Lynn G. 16 Fr. 02/15/23  1321  --  91   Pressure Injury 02/07/23  Sacrum Mid Stage 2 -  Partial thickness loss of dermis presenting as a shallow open injury with a red, pink wound bed without slough. small circular area of broken skin without drainage 02/07/23  1500  -- 99   Wound / Incision (Open or Dehisced) 08/13/20 ([facility]) Incontinence Associated Dermatitis Perineum Right;Left 08/13/20  0800  Perineum  1007   Wound / Incision (Open or Dehisced) 02/06/23 Skin tear Pretibial Left 02/06/23  0530  Pretibial  100   Wound / Incision (Open or Dehisced) 02/06/23 Laceration Head Upper 02/06/23  0800  Head  100            Intake/Output  Last 24 hours No intake or output data in the 24 hours ending 05/17/23 1656  Labs/Imaging Results for orders placed or performed during the hospital encounter of 05/17/23 (from the past 48 hour(s))  CBG monitoring, ED     Status: Abnormal   Collection Time: 05/17/23  9:16 AM  Result Value Ref Range   Glucose-Capillary 152 (H) 70 - 99 mg/dL    Comment: Glucose reference range applies only to samples taken after fasting for at least 8 hours.  CBC with Differential     Status: Abnormal   Collection Time: 05/17/23  9:18 AM  Result Value Ref Range   WBC 7.6 4.0 - 10.5 K/uL   RBC 4.51 3.87 - 5.11 MIL/uL   Hemoglobin 11.8 (L) 12.0 - 15.0 g/dL   HCT 40.9 81.1 - 91.4 %   MCV 84.9 80.0 - 100.0 fL   MCH 26.2 26.0 - 34.0 pg   MCHC 30.8 30.0 - 36.0 g/dL   RDW 78.2 95.6 - 21.3 %   Platelets 296 150 - 400 K/uL   nRBC 0.0 0.0 - 0.2 %   Neutrophils Relative % 75 %   Neutro Abs 5.8 1.7 - 7.7 K/uL   Lymphocytes Relative 17 %   Lymphs Abs 1.3 0.7 - 4.0 K/uL   Monocytes Relative 6 %   Monocytes Absolute 0.4 0.1 - 1.0 K/uL   Eosinophils Relative 1 %   Eosinophils Absolute 0.0 0.0 - 0.5 K/uL   Basophils Relative 0 %   Basophils Absolute 0.0 0.0 - 0.1 K/uL   Immature Granulocytes 1 %   Abs Immature Granulocytes 0.04 0.00 - 0.07 K/uL    Comment: Performed at [facility], [address] [place], [place] [address]  Basic metabolic panel     Status: Abnormal   Collection Time: 05/17/23  9:18 AM  Result Value Ref Range   Sodium 137 135 - 145 mmol/L   Potassium 4.0 3.5 - 5.1 mmol/L   Chloride 105 98 - 111 mmol/L   CO2 20 (L) 22 - 32 mmol/L   Glucose, Bld 145 (H) 70 - 99 mg/dL    Comment: Glucose reference range applies only to samples taken after fasting for at least 8 hours.   BUN 21 8 - 23 mg/dL   Creatinine, Ser 8.46 0.44 - 1.00 mg/dL   Calcium 9.0 8.9 - 96.2 mg/dL   GFR, Estimated >95 >28 mL/min    Comment: (NOTE) Calculated using the CKD-EPI Creatinine Equation (2021)    Anion gap  12 5 - 15    Comment: Performed at [facility], [address] [place], [place] [address]  Magnesium     Status: Abnormal   Collection Time: 05/17/23  9:18 AM  Result Value Ref Range   Magnesium 1.3 (L) 1.7 - 2.4 mg/dL  Comment: Performed at [facility], [address] [place], [place] [address]  CK     Status: Abnormal   Collection Time: 05/17/23  9:18 AM  Result Value Ref Range   Total CK 37 (L) 38 - 234 U/L    Comment: Performed at [facility], [address] [place], [place] [address]  Ethanol     Status: None   Collection Time: 05/17/23  9:18 AM  Result Value Ref Range   Alcohol, Ethyl (B) <10 <10 mg/dL    Comment: (NOTE) Lowest detectable limit for serum alcohol is 10 mg/dL.  For medical purposes only. Performed at [facility], [address], [place], [place] [address]   Resp panel by RT-PCR (RSV, Flu A&B, Covid) Anterior Nasal Swab     Status: None   Collection Time: 05/17/23  9:29 AM   Specimen: Anterior Nasal Swab  Result Value Ref Range   SARS Coronavirus 2 by RT PCR NEGATIVE NEGATIVE   Influenza A by PCR NEGATIVE NEGATIVE   Influenza B by PCR NEGATIVE NEGATIVE    Comment: (NOTE) The Xpert Xpress SARS-CoV-2/FLU/RSV plus assay is intended as an aid in the diagnosis of influenza from Nasopharyngeal swab specimens and should not be used as a sole basis for treatment. Nasal washings and aspirates are unacceptable for Xpert Xpress SARS-CoV-2/FLU/RSV testing.  Fact Sheet for Patients: BloggerCourse.com  Fact Sheet for Healthcare Providers: SeriousBroker.it  This test is not yet approved or cleared by the [place] FDA and has been authorized for detection and/or diagnosis of SARS-CoV-2 by FDA under an Emergency Use Authorization (EUA). This EUA will remain in effect (meaning this test can be used) for the duration of the COVID-19 declaration under Section 564(b)(1) of the Act, 21  U.S.C. section 360bbb-3(b)(1), unless the authorization is terminated or revoked.     Resp Syncytial Virus by PCR NEGATIVE NEGATIVE    Comment: (NOTE) Fact Sheet for Patients: BloggerCourse.com  Fact Sheet for Healthcare Providers: SeriousBroker.it  This test is not yet approved or cleared by the [place] FDA and has been authorized for detection and/or diagnosis of SARS-CoV-2 by FDA under an Emergency Use Authorization (EUA). This EUA will remain in effect (meaning this test can be used) for the duration of the COVID-19 declaration under Section 564(b)(1) of the Act, 21 U.S.C. section 360bbb-3(b)(1), unless the authorization is terminated or revoked.  Performed at [facility], [address], [place], [place] [address]   Rapid urine drug screen (hospital performed)     Status: None   Collection Time: 05/17/23 10:00 AM  Result Value Ref Range   Opiates NONE DETECTED NONE DETECTED   Cocaine NONE DETECTED NONE DETECTED   Benzodiazepines NONE DETECTED NONE DETECTED   Amphetamines NONE DETECTED NONE DETECTED   Tetrahydrocannabinol NONE DETECTED NONE DETECTED   Barbiturates NONE DETECTED NONE DETECTED    Comment: (NOTE) DRUG SCREEN FOR MEDICAL PURPOSES ONLY.  IF CONFIRMATION IS NEEDED FOR ANY PURPOSE, NOTIFY LAB WITHIN 5 DAYS.  LOWEST DETECTABLE LIMITS FOR URINE DRUG SCREEN Drug Class                     Cutoff (ng/mL) Amphetamine and metabolites    1000 Barbiturate and metabolites    200 Benzodiazepine                 200 Opiates and metabolites        300 Cocaine and metabolites  300 THC                            50 Performed at [facility], [address], [place], [place] [address]   Urinalysis, Routine w reflex microscopic -Urine, Catheterized; In and out catheter     Status: Abnormal   Collection Time: 05/17/23 10:00 AM  Result Value Ref Range   Color, Urine YELLOW YELLOW   APPearance  HAZY (A) CLEAR   Specific Gravity, Urine 1.014 1.005 - 1.030   pH 5.0 5.0 - 8.0   Glucose, UA NEGATIVE NEGATIVE mg/dL   Hgb urine dipstick NEGATIVE NEGATIVE   Bilirubin Urine NEGATIVE NEGATIVE   Ketones, ur NEGATIVE NEGATIVE mg/dL   Protein, ur 30 (A) NEGATIVE mg/dL   Nitrite NEGATIVE NEGATIVE   Leukocytes,Ua NEGATIVE NEGATIVE   RBC / HPF 0-5 0 - 5 RBC/hpf   WBC, UA 0-5 0 - 5 WBC/hpf   Bacteria, UA FEW (A) NONE SEEN   Squamous Epithelial / HPF 0-5 0 - 5 /HPF    Comment: Performed at [facility], [address], [place], [place] [address]  CBG monitoring, ED     Status: Abnormal   Collection Time: 05/17/23  4:19 PM  Result Value Ref Range   Glucose-Capillary 102 (H) 70 - 99 mg/dL    Comment: Glucose reference range applies only to samples taken after fasting for at least 8 hours.   *Note: Due to a large number of results and/or encounters for the requested time period, some results have not been displayed. A complete set of results can be found in Results Review.   CT Head Wo Contrast  Result Date: 05/17/2023 CLINICAL DATA:  Provided history: Altered mental status, nontraumatic. Seizures. EXAM: CT HEAD WITHOUT CONTRAST TECHNIQUE: Contiguous axial images were obtained from the base of the skull through the vertex without intravenous contrast. RADIATION DOSE REDUCTION: This exam was performed according to the departmental dose-optimization program which includes automated exposure control, adjustment of the mA and/or kV according to patient size and/or use of iterative reconstruction technique. COMPARISON:  Head CT 03/01/2023. FINDINGS: Brain: Generalized cerebral atrophy. Patchy and ill-defined hypoattenuation within the cerebral white matter, nonspecific but compatible with mild chronic small vessel ischemic disease. There is no acute intracranial hemorrhage. No demarcated cortical infarct. No extra-axial fluid collection. No evidence of an intracranial mass. No midline shift.  Vascular: No hyperdense vessel.  Atherosclerotic calcifications. Skull: No calvarial fracture or aggressive osseous lesion. Sinuses/Orbits: Bilateral proptosis. Chronic fracture deformity of the right orbital floor. No significant paranasal sinus disease. IMPRESSION: 1. No evidence of an acute intracranial abnormality. 2. Mild chronic small vessel ischemic changes within the cerebral white matter. 3. Cerebral atrophy. 4. Bilateral proptosis. Electronically Signed   By: Jackey Loge D.O.   On: 05/17/2023 11:48   DG Chest Portable 1 View  Result Date: 05/17/2023 CLINICAL DATA:  89 year old female with cough, shortness of breath, seizure. EXAM: PORTABLE CHEST 1 VIEW COMPARISON:  Portable chest 02/15/2023 and earlier. FINDINGS: Portable AP upright view at 0939 hours. Lower lung volumes. Calcified aortic atherosclerosis. Stable cardiomegaly and mediastinal contours. Questionable rightward tracheal or gas containing esophagus adeviation compared to a July chest CT, and also a cervical spine CT 03/01/2023. The patient's head is turned to the left. Chronic asymmetrically increased density at the right thoracic inlet is related to tortuous brachiocephalic artery and great vessels on the prior CT. No pneumothorax, pleural effusion, consolidation. Coarse  bilateral pulmonary interstitial markings appear stable. Paucity of bowel gas. Stable cholecystectomy clips. No acute osseous abnormality identified. IMPRESSION: 1.  No acute cardiopulmonary abnormality. 2. Probably gas containing nondilated esophagus, rather than trachea, which is deviated to the right at the thoracic inlet. Significance is doubtful. 3.  Aortic Atherosclerosis (ICD10-I70.0). Electronically Signed   By: Odessa Fleming M.D.   On: 05/17/2023 09:48    Pending Labs Unresulted Labs (From admission, onward)     Start     Ordered   05/18/23 0500  Comprehensive metabolic panel  Tomorrow morning,   R        05/17/23 1541   05/18/23 0500  Magnesium  Tomorrow  morning,   R        05/17/23 1541   05/18/23 0500  CBC with Differential/Platelet  Tomorrow morning,   R        05/17/23 1541   05/17/23 1455  TSH  Add-on,   AD        05/17/23 1454   05/17/23 1428  Culture, Urine (Do not remove urinary catheter, catheter placed by urology or difficult to place)  (Urine Culture)  Once,   URGENT       Question:  Indication  Answer:  Altered mental status (if no other cause identified)   05/17/23 1427   05/17/23 0930  Lamotrigine level  Once,   URGENT        05/17/23 0929            Vitals/Pain Today's Vitals   05/17/23 1600 05/17/23 1615 05/17/23 1630 05/17/23 1645  BP: (!) 140/67 (!) 150/65 (!) 165/72 (!) 152/77  Pulse: 82 81 83 80  Resp: (!) 24 (!) 25 (!) 25 (!) 25  Temp:      TempSrc:      SpO2: 98% 98% 97% 98%  Weight:        Isolation Precautions No active isolations  Medications Medications  lamoTRIgine (LAMICTAL) tablet 50 mg (has no administration in time range)    Or  lacosamide (VIMPAT) 50 mg in sodium chloride 0.9 % 25 mL IVPB (has no administration in time range)  lamoTRIgine (LAMICTAL) tablet 25 mg (has no administration in time range)    Or  lacosamide (VIMPAT) 50 mg in sodium chloride 0.9 % 25 mL IVPB (has no administration in time range)  midazolam (VERSED) injection 1 mg (has no administration in time range)  labetalol (NORMODYNE) injection 10 mg (has no administration in time range)  insulin aspart (novoLOG) injection 0-15 Units (has no administration in time range)  insulin aspart (novoLOG) injection 0-5 Units (has no administration in time range)  heparin injection 5,000 Units (has no administration in time range)  acetaminophen (TYLENOL) suppository 650 mg (has no administration in time range)  ondansetron (ZOFRAN) injection 4 mg (has no administration in time range)  lactated ringers infusion ( Intravenous New Bag/Given 05/17/23 1552)  LORazepam (ATIVAN) injection 2 mg (2 mg Intravenous Given 05/17/23 0930)   levETIRAcetam (KEPPRA) IVPB 1500 mg/ 100 mL premix (0 mg Intravenous Stopped 05/17/23 1119)  magnesium sulfate IVPB 2 g 50 mL (0 g Intravenous Stopped 05/17/23 1542)    Mobility non-ambulatory     Focused Assessments Neuro Assessment Handoff:  Swallow screen pass? No          Neuro Assessment:   Neuro Checks:      Has TPA been given? No If patient is a Neuro Trauma and patient is going to OR before floor call report to  4N Charge nurse: [phone] or [phone]   R Recommendations: See Admitting Provider Note  Report given to:   Additional Notes:

## 2023-05-18 DIAGNOSIS — G928 Other toxic encephalopathy: Secondary | ICD-10-CM | POA: Diagnosis present

## 2023-05-18 DIAGNOSIS — Z794 Long term (current) use of insulin: Secondary | ICD-10-CM | POA: Diagnosis not present

## 2023-05-18 DIAGNOSIS — N939 Abnormal uterine and vaginal bleeding, unspecified: Secondary | ICD-10-CM | POA: Diagnosis present

## 2023-05-18 DIAGNOSIS — Z87891 Personal history of nicotine dependence: Secondary | ICD-10-CM | POA: Diagnosis not present

## 2023-05-18 DIAGNOSIS — I5032 Chronic diastolic (congestive) heart failure: Secondary | ICD-10-CM | POA: Diagnosis present

## 2023-05-18 DIAGNOSIS — I110 Hypertensive heart disease with heart failure: Secondary | ICD-10-CM | POA: Diagnosis present

## 2023-05-18 DIAGNOSIS — E876 Hypokalemia: Secondary | ICD-10-CM | POA: Diagnosis not present

## 2023-05-18 DIAGNOSIS — G4700 Insomnia, unspecified: Secondary | ICD-10-CM | POA: Diagnosis present

## 2023-05-18 DIAGNOSIS — Z993 Dependence on wheelchair: Secondary | ICD-10-CM | POA: Diagnosis not present

## 2023-05-18 DIAGNOSIS — F03918 Unspecified dementia, unspecified severity, with other behavioral disturbance: Secondary | ICD-10-CM | POA: Diagnosis present

## 2023-05-18 DIAGNOSIS — G40919 Epilepsy, unspecified, intractable, without status epilepticus: Secondary | ICD-10-CM | POA: Diagnosis not present

## 2023-05-18 DIAGNOSIS — Z79899 Other long term (current) drug therapy: Secondary | ICD-10-CM | POA: Diagnosis not present

## 2023-05-18 DIAGNOSIS — E785 Hyperlipidemia, unspecified: Secondary | ICD-10-CM | POA: Diagnosis present

## 2023-05-18 DIAGNOSIS — Z1152 Encounter for screening for COVID-19: Secondary | ICD-10-CM | POA: Diagnosis not present

## 2023-05-18 DIAGNOSIS — R079 Chest pain, unspecified: Secondary | ICD-10-CM | POA: Diagnosis not present

## 2023-05-18 DIAGNOSIS — Z23 Encounter for immunization: Secondary | ICD-10-CM | POA: Diagnosis present

## 2023-05-18 DIAGNOSIS — I2510 Atherosclerotic heart disease of native coronary artery without angina pectoris: Secondary | ICD-10-CM | POA: Diagnosis present

## 2023-05-18 DIAGNOSIS — G40901 Epilepsy, unspecified, not intractable, with status epilepticus: Principal | ICD-10-CM

## 2023-05-18 DIAGNOSIS — Z7902 Long term (current) use of antithrombotics/antiplatelets: Secondary | ICD-10-CM | POA: Diagnosis not present

## 2023-05-18 DIAGNOSIS — Z66 Do not resuscitate: Secondary | ICD-10-CM | POA: Diagnosis present

## 2023-05-18 DIAGNOSIS — E1169 Type 2 diabetes mellitus with other specified complication: Secondary | ICD-10-CM | POA: Diagnosis present

## 2023-05-18 DIAGNOSIS — Z8249 Family history of ischemic heart disease and other diseases of the circulatory system: Secondary | ICD-10-CM | POA: Diagnosis not present

## 2023-05-18 DIAGNOSIS — J449 Chronic obstructive pulmonary disease, unspecified: Secondary | ICD-10-CM | POA: Diagnosis present

## 2023-05-18 DIAGNOSIS — I252 Old myocardial infarction: Secondary | ICD-10-CM | POA: Diagnosis not present

## 2023-05-18 DIAGNOSIS — Z955 Presence of coronary angioplasty implant and graft: Secondary | ICD-10-CM | POA: Diagnosis not present

## 2023-05-18 LAB — COMPREHENSIVE METABOLIC PANEL
ALT: 23 U/L (ref 0–44)
AST: 30 U/L (ref 15–41)
Albumin: 2.9 g/dL — ABNORMAL LOW (ref 3.5–5.0)
Alkaline Phosphatase: 46 U/L (ref 38–126)
Anion gap: 10 (ref 5–15)
BUN: 12 mg/dL (ref 8–23)
CO2: 22 mmol/L (ref 22–32)
Calcium: 8.6 mg/dL — ABNORMAL LOW (ref 8.9–10.3)
Chloride: 104 mmol/L (ref 98–111)
Creatinine, Ser: 0.79 mg/dL (ref 0.44–1.00)
GFR, Estimated: >60 mL/min (ref >60)
Glucose, Bld: 99 mg/dL (ref 70–99)
Potassium: 3.2 mmol/L — ABNORMAL LOW (ref 3.5–5.1)
Sodium: 136 mmol/L (ref 135–145)
Total Bilirubin: 0.3 mg/dL (ref 0.3–1.2)
Total Protein: 6.3 g/dL — ABNORMAL LOW (ref 6.5–8.1)

## 2023-05-18 LAB — GLUCOSE, CAPILLARY
Glucose-Capillary: 102 mg/dL — ABNORMAL HIGH (ref 70–99)
Glucose-Capillary: 128 mg/dL — ABNORMAL HIGH (ref 70–99)
Glucose-Capillary: 139 mg/dL — ABNORMAL HIGH (ref 70–99)
Glucose-Capillary: 155 mg/dL — ABNORMAL HIGH (ref 70–99)
Glucose-Capillary: 95 mg/dL (ref 70–99)

## 2023-05-18 LAB — CBC WITH DIFFERENTIAL/PLATELET
Abs Immature Granulocytes: 0.01 10*3/uL (ref 0.00–0.07)
Basophils Absolute: 0.1 10*3/uL (ref 0.0–0.1)
Basophils Relative: 1 %
Eosinophils Absolute: 0.2 10*3/uL (ref 0.0–0.5)
Eosinophils Relative: 2 %
HCT: 35.9 % — ABNORMAL LOW (ref 36.0–46.0)
Hemoglobin: 11.3 g/dL — ABNORMAL LOW (ref 12.0–15.0)
Immature Granulocytes: 0 %
Lymphocytes Relative: 30 %
Lymphs Abs: 2.1 10*3/uL (ref 0.7–4.0)
MCH: 26 pg (ref 26.0–34.0)
MCHC: 31.5 g/dL (ref 30.0–36.0)
MCV: 82.7 fL (ref 80.0–100.0)
Monocytes Absolute: 0.6 10*3/uL (ref 0.1–1.0)
Monocytes Relative: 9 %
Neutro Abs: 3.9 10*3/uL (ref 1.7–7.7)
Neutrophils Relative %: 58 %
Platelets: 300 10*3/uL (ref 150–400)
RBC: 4.34 MIL/uL (ref 3.87–5.11)
RDW: 14 % (ref 11.5–15.5)
WBC: 6.7 10*3/uL (ref 4.0–10.5)
nRBC: 0 % (ref 0.0–0.2)

## 2023-05-18 LAB — LAMOTRIGINE LEVEL: Lamotrigine Lvl: <1 ug/mL — ABNORMAL LOW (ref 2.0–20.0)

## 2023-05-18 LAB — MAGNESIUM: Magnesium: 1.9 mg/dL (ref 1.7–2.4)

## 2023-05-18 MED ORDER — ASPIRIN 81 MG PO CHEW
81.0000 mg | CHEWABLE_TABLET | Freq: Every day | ORAL | Status: DC
  Administered 2023-05-18 – 2023-05-26 (×9): 81 mg via ORAL
  Filled 2023-05-18 (×9): qty 1

## 2023-05-18 MED ORDER — POTASSIUM CHLORIDE 20 MEQ PO PACK
60.0000 meq | PACK | Freq: Once | ORAL | Status: AC
  Administered 2023-05-18: 60 meq via ORAL
  Filled 2023-05-18: qty 3

## 2023-05-18 MED ORDER — ROSUVASTATIN CALCIUM 20 MG PO TABS
40.0000 mg | ORAL_TABLET | Freq: Every day | ORAL | Status: DC
  Administered 2023-05-18 – 2023-05-26 (×9): 40 mg via ORAL
  Filled 2023-05-18 (×9): qty 2

## 2023-05-18 MED ORDER — AMLODIPINE BESYLATE 2.5 MG PO TABS
2.5000 mg | ORAL_TABLET | Freq: Every day | ORAL | Status: DC
  Administered 2023-05-18 – 2023-05-26 (×9): 2.5 mg via ORAL
  Filled 2023-05-18 (×9): qty 1

## 2023-05-18 MED ORDER — POTASSIUM CHLORIDE CRYS ER 20 MEQ PO TBCR
60.0000 meq | EXTENDED_RELEASE_TABLET | Freq: Once | ORAL | Status: DC
  Filled 2023-05-18: qty 3

## 2023-05-18 MED ORDER — SERTRALINE HCL 100 MG PO TABS
100.0000 mg | ORAL_TABLET | Freq: Every day | ORAL | Status: DC
  Administered 2023-05-18 – 2023-05-21 (×4): 100 mg via ORAL
  Filled 2023-05-18 (×4): qty 1

## 2023-05-18 MED ORDER — ENALAPRIL MALEATE 10 MG PO TABS
20.0000 mg | ORAL_TABLET | Freq: Every day | ORAL | Status: DC
  Administered 2023-05-18 – 2023-05-25 (×8): 20 mg via ORAL
  Filled 2023-05-18 (×9): qty 2

## 2023-05-18 MED ORDER — CLOPIDOGREL BISULFATE 75 MG PO TABS
75.0000 mg | ORAL_TABLET | Freq: Every day | ORAL | Status: DC
  Administered 2023-05-18 – 2023-05-26 (×9): 75 mg via ORAL
  Filled 2023-05-18 (×9): qty 1

## 2023-05-18 MED ORDER — ALBUTEROL SULFATE (2.5 MG/3ML) 0.083% IN NEBU: 2.5000 mg | INHALATION_SOLUTION | Freq: Four times a day (QID) | RESPIRATORY_TRACT | Status: DC | PRN

## 2023-05-18 MED ORDER — CARVEDILOL 12.5 MG PO TABS
12.5000 mg | ORAL_TABLET | Freq: Two times a day (BID) | ORAL | Status: DC
  Administered 2023-05-18 – 2023-05-26 (×17): 12.5 mg via ORAL
  Filled 2023-05-18 (×17): qty 1

## 2023-05-18 NOTE — Progress Notes (Signed)
Progress Note   Patient: Michelle [name] ZOX:[id number] DOB: [birth year] DOA: 05/17/2023     0 DOS: the patient was seen and examined on 05/18/2023   Brief hospital course: 89 year old Caucasian female history of seizures, type 2 diabetes, hypertension, chronic diastolic heart failure, wheelchair bound, presents to the ER today with a seizure at home.  Patient lives with her son [name].  Patient does not manage any of her medications.  This is performed by the family.  Son found the patient having a seizure this morning.  Last known normal was last night.  Was still having altered mental status on presentation.  Had a myoclonic seizure in the ER.  was given IV Ativan in the ER.  Patient seen by neurology.  She was loaded with IV Keppra.  She is normally on Lamictal 25 mg twice daily.   Patient is been seen by neurology.  Overnight EEG has been ordered.   [facility] hospitalist consulted for admission.  Assessment and Plan:  Breakthrough seizure (HCC) Admit to progressive neurology bed with telemetry for breakthrough seizures.  Neurology following. Recs to continue EEG. Lamotrigine increased to 50mg  at bedtime and continue 25mg  daily Awake and oriented this AM, appropriately conversant   Wheelchair dependence Chronic.  Son [name] states that patient walks minimally at home. PT/OT consulted   Chronic diastolic CHF (congestive heart failure) (HCC) Patient remains euvolemic.   COPD (chronic obstructive pulmonary disease) (HCC) Stable.  Use nebulizer treatments while she is still altered.   Essential hypertension Continue home meds as tolerated   Hyperlipidemia associated with type 2 diabetes mellitus (HCC) Resume home statin   Type 2 diabetes mellitus without complication, with long-term current use of insulin (HCC) Continue SSI as needed Glycemic trends stable   Hypomagnesemia Replaced        Subjective: Reports feeling better today  Physical Exam: Vitals:   05/18/23 1200  05/18/23 1238 05/18/23 1300 05/18/23 1400  BP:  (!) 163/73    Pulse:  76    Resp: 20 16 19  (!) 25  Temp:  99 F (37.2 C)    TempSrc:  Oral    SpO2:  97%    Weight:       General exam: Awake, laying in bed, in nad Respiratory system: Normal respiratory effort, no wheezing Cardiovascular system: regular rate, s1, s2 Gastrointestinal system: Soft, nondistended, positive BS Central nervous system: CN2-12 grossly intact, strength intact Extremities: Perfused, no clubbing Skin: Normal skin turgor, no notable skin lesions seen Psychiatry: Mood normal // no visual hallucinations   Data Reviewed:  Labs reviewed: Na 135, K 3.2, Cr 0.79, WBC 6.7, Hgb 11.3, Plts 300  Family Communication: Pt in room, pt's son at bedside  Disposition: Status is: Observation The patient will require care spanning > 2 midnights and should be moved to inpatient because: severity of illness  Planned Discharge Destination: Home     Author: Rickey Barbara, MD 05/18/2023 3:19 PM  For on call review [email or web address].

## 2023-05-18 NOTE — TOC Initial Note (Signed)
Transition of Care [facility]) - Initial/Assessment Note    Patient Details  Name: Michelle [name] MRN: [id number] Date of Birth: 1932/12/14  Transition of Care [facility]) CM/SW Contact:    Kermit Balo, RN Phone Number: 05/18/2023, 11:47 AM  Clinical Narrative:                  Patient is from home with her son. She had a recent stay at [facility] place but has been home about a month.  She has needed DME at home. Family assists with ADL's.  Family provides needed transportation and manages her medications.  Pt on LTEEG.  Awaiting therapy evaluations.  TOC following.  Expected Discharge Plan: Home/Self Care Barriers to Discharge: Continued Medical Work up   Patient Goals and CMS Choice            Expected Discharge Plan and Services   Discharge Planning Services: CM Consult   Living arrangements for the past 2 months: Single Family Home                                      Prior Living Arrangements/Services Living arrangements for the past 2 months: Single Family Home Lives with:: Adult Children Patient language and need for interpreter reviewed:: Yes Do you feel safe going back to the place where you live?: Yes        Care giver support system in place?: Yes (comment) Current home services: DME (wheelchair) Criminal Activity/Legal Involvement Pertinent to Current Situation/Hospitalization: No - Comment as needed  Activities of Daily Living   ADL Screening (condition at time of admission) Independently performs ADLs?: No Does the patient have a NEW difficulty with bathing/dressing/toileting/self-feeding that is expected to last >3 days?: No Does the patient have a NEW difficulty with getting in/out of bed, walking, or climbing stairs that is expected to last >3 days?: No Does the patient have a NEW difficulty with communication that is expected to last >3 days?: No Is the patient deaf or have difficulty hearing?: No Does the patient have difficulty seeing, even  when wearing glasses/contacts?: No Does the patient have difficulty concentrating, remembering, or making decisions?: Yes  Permission Sought/Granted                  Emotional Assessment Appearance:: Appears stated age         Psych Involvement: No (comment)  Admission diagnosis:  Status epilepticus (HCC) [G40.901] Seizure (HCC) [R56.9] Breakthrough seizure (HCC) [G40.919] Patient Active Problem List   Diagnosis Date Noted   Breakthrough seizure (HCC) 05/17/2023   Wheelchair dependence 05/17/2023   DNR (do not resuscitate)/DNI(Do Not Intubate) 05/17/2023   Hypomagnesemia 05/17/2023   Seizure (HCC) 02/06/2023   Memory impairment 09/09/2022   Chronic pain of both knees 04/05/2022   Epilepsy (HCC) 03/12/2021   Adnexal mass 11/24/2020   Thickened endometrium 11/24/2020   Dementia (HCC) 10/22/2020   Actinic keratosis 11/17/2017   Anemia, iron deficiency 10/23/2015   CKD (chronic kidney disease), stage III (HCC) 07/25/2014   Osteoarthritis, knee 05/13/2014   Anxiety state 04/21/2014   Chronic diastolic CHF (congestive heart failure) (HCC) 03/13/2014   SOB (shortness of breath) 03/05/2014   CAD- RCA PCI '90s, STEMI-RCA DES 02/18/14 03/05/2014   Back pain, lumbosacral 10/10/2012   Type 2 diabetes mellitus without complication, with long-term current use of insulin (HCC) 03/26/2007   Hyperlipidemia associated with type 2 diabetes mellitus (HCC) 03/26/2007  Essential hypertension 04/21/2006   COPD (chronic obstructive pulmonary disease) (HCC) 04/21/2006   PCP:  Shelva Majestic, MD Pharmacy:   [facility] [id number] - [address] [place] - [address] [facility] [address] [place] [address] Phone: [phone] Fax: [phone]  [facility] - [place], [place] - [address] [place] [address] Phone: [phone] Fax: [phone]  [place] [facility] [address] [place] [address] Phone: [phone] Fax: [phone]     Social Determinants of Health (SDOH) Social History: SDOH Screenings   Food Insecurity: No Food Insecurity (05/17/2023)  Housing: Patient Declined (05/17/2023)  Transportation Needs: No Transportation Needs (05/17/2023)  Utilities: Not At Risk (05/17/2023)  Depression (PHQ2-9): Medium Risk (08/19/2022)  Financial Resource Strain: Low Risk  (06/20/2022)  Physical Activity: Inactive (06/20/2022)  Social Connections: Socially Isolated (06/20/2022)  Stress: No Stress Concern Present (06/20/2022)  Tobacco Use: Medium Risk (05/17/2023)   SDOH Interventions:     Readmission Risk Interventions    09/01/2020   12:30 PM  Readmission Risk Prevention Plan  Transportation Screening Complete  PCP or Specialist Appt within 3-5 Days Complete  HRI or Home Care Consult Complete  Social Work Consult for Recovery Care Planning/Counseling Complete  Palliative Care Screening Not Applicable  Medication Review Oceanographer) Complete

## 2023-05-18 NOTE — Progress Notes (Signed)
The  patient is having severe diarrhea. and needs something for it. She's not on any laxative or stool softer. Dr. Joneen Roach notified

## 2023-05-18 NOTE — Procedures (Addendum)
Patient Name: Michelle [name]  MRN: [id number]  Epilepsy Attending: Charlsie Quest  Referring Physician/Provider: Charlsie Quest, MD  Duration: 05/17/2023 1502 to 05/18/2023 1930  Patient history:  89 year old female with history of dementia and epilepsy presented with breakthrough seizure-like episode.   Level of alertness: Awake, asleep  AEDs during EEG study: LEV, ativan, LCM  Technical aspects: This EEG study was done with scalp electrodes positioned according to the 10-20 International system of electrode placement. Electrical activity was reviewed with band pass filter of 1-70Hz , sensitivity of 7 uV/mm, display speed of 63mm/sec with a 60Hz  notched filter applied as appropriate. EEG data were recorded continuously and digitally stored.  Video monitoring was available and reviewed as appropriate.  Description: At the beginning of study, EEG showed continuous generalized 3 to 6 Hz theta-delta slowing. Gradually EEG improved and showed posterior dominant rhythm of 8 Hz activity of moderate voltage (25-35 uV) seen predominantly in posterior head regions, symmetric and reactive to eye opening and eye closing. Sleep was characterized by vertex waves, sleep spindles (12 to 14 Hz), maximal frontocentral region. Hyperventilation and photic stimulation were not performed.     EEG was disconnected between 05/17/2023 1907 to 2320 as patient was bring moved to a different room.  ABNORMALITY - Continuous slow, generalized  IMPRESSION: This study was initially suggestive of moderate diffuse encephalopathy. Gradually EEG improved and was within normal limits. No seizures or definite epileptiform discharges were seen throughout the recording.  Lack of epileptiform activity during interictal EEG does not exclude the diagnosis of epilepsy.  [name]

## 2023-05-18 NOTE — Evaluation (Signed)
Physical Therapy Evaluation Patient Details Name: Michelle [name] MRN: [id number] DOB: [birth year] Today's Date: 05/18/2023  History of Present Illness  89 yo female presents to ED on 10/30 with witnessed seizure. PMH includes HTN, seizures, DMII, HLD, CAD, COPD.  Clinical Impression  Pt presents with generalized weakness, impaired balance with R lateral bias in sitting, impaired mobility, and decreased activity tolerance. Pt to benefit from acute PT to address deficits. Pt requiring max assist for bed mobility and attempted transfer into stand, pt unable to progress into standing given fatigue and weakness. At baseline pt mobilizes household distances with RW. Patient will benefit from continued inpatient follow up therapy, <3 hours/day. PT to progress mobility as tolerated, and will continue to follow acutely.          If plan is discharge home, recommend the following: Two people to help with walking and/or transfers;Two people to help with bathing/dressing/bathroom   Can travel by private vehicle   No    Equipment Recommendations None recommended by PT  Recommendations for Other Services       Functional Status Assessment Patient has had a recent decline in their functional status and demonstrates the ability to make significant improvements in function in a reasonable and predictable amount of time.     Precautions / Restrictions Precautions Precautions: Fall Restrictions Weight Bearing Restrictions: No      Mobility  Bed Mobility Overal bed mobility: Needs Assistance Bed Mobility: Supine to Sit, Sit to Supine, Rolling Rolling: Min assist   Supine to sit: Max assist Sit to supine: Max assist   General bed mobility comments: assist for trunk and LE management, scooting to/from EOB, boost up in bed with ues of bed pads and trendelenburg function. light assist for completion of truncal translation with rolling    Transfers Overall transfer level: Needs  assistance Equipment used: Rolling walker (2 wheels) Transfers: Sit to/from Stand Sit to Stand: Max assist           General transfer comment: attempted x2 with max assist, unable to complete given pt weakness and fatigue    Ambulation/Gait                  Stairs            Wheelchair Mobility     Tilt Bed    Modified Rankin (Stroke Patients Only)       Balance Overall balance assessment: Needs assistance Sitting-balance support: No upper extremity supported, Feet supported Sitting balance-Leahy Scale: Poor Sitting balance - Comments: difficult to correct from R lateral bias Postural control: Right lateral lean   Standing balance-Leahy Scale: Zero Standing balance comment: unable                             Pertinent Vitals/Pain Pain Assessment Pain Assessment: Faces Faces Pain Scale: Hurts little more Pain Location: LLE, calf particularly Pain Descriptors / Indicators: Discomfort, Grimacing, Guarding Pain Intervention(s): Limited activity within patient's tolerance, Monitored during session, Repositioned    Home Living Family/patient expects to be discharged to:: Private residence Living Arrangements: Children Available Help at Discharge: Family;Available 24 hours/day Type of Home: House Home Access: Ramped entrance       Home Layout: One level Home Equipment: Rollator (4 wheels);Rolling Walker (2 wheels);BSC/3in1;Shower seat;Wheelchair - manual      Prior Function Prior Level of Function : Needs assist             Mobility Comments:  uses RW for short distance gait, they also have a w/c ADLs Comments: BSC at baseline     Extremity/Trunk Assessment   Upper Extremity Assessment Upper Extremity Assessment: Defer to OT evaluation    Lower Extremity Assessment Lower Extremity Assessment: Generalized weakness    Cervical / Trunk Assessment Cervical / Trunk Assessment: Kyphotic  Communication    Communication Communication: No apparent difficulties Cueing Techniques: Verbal cues;Gestural cues  Cognition Arousal: Lethargic Behavior During Therapy: Flat affect Overall Cognitive Status: Difficult to assess Area of Impairment: Orientation, Following commands, Awareness, Problem solving                 Orientation Level: Disoriented to, Time     Following Commands: Follows one step commands with increased time   Awareness: Intellectual Problem Solving: Slow processing, Decreased initiation, Difficulty sequencing, Requires verbal cues, Requires tactile cues General Comments: oriented to everything but month, per pt's son 3 people have asked her what month it is today. Increased processing time to answer questions and follow one-step commands.        General Comments General comments (skin integrity, edema, etc.): vss, hooked up to EEG throughout    Exercises     Assessment/Plan    PT Assessment Patient needs continued PT services  PT Problem List Decreased strength;Decreased mobility;Decreased activity tolerance;Decreased balance;Decreased knowledge of use of DME;Pain;Decreased cognition;Decreased knowledge of precautions;Decreased safety awareness       PT Treatment Interventions DME instruction;Therapeutic activities;Gait training;Therapeutic exercise;Patient/family education;Balance training;Functional mobility training;Neuromuscular re-education    PT Goals (Current goals can be found in the Care Plan section)  Acute Rehab PT Goals Patient Stated Goal: home if possible PT Goal Formulation: With patient Time For Goal Achievement: 06/01/23 Potential to Achieve Goals: Fair    Frequency Min 1X/week     Co-evaluation               AM-PAC PT "6 Clicks" Mobility  Outcome Measure Help needed turning from your back to your side while in a flat bed without using bedrails?: A Little Help needed moving from lying on your back to sitting on the side of a flat  bed without using bedrails?: A Lot Help needed moving to and from a bed to a chair (including a wheelchair)?: Total Help needed standing up from a chair using your arms (e.g., wheelchair or bedside chair)?: Total Help needed to walk in hospital room?: Total Help needed climbing 3-5 steps with a railing? : Total 6 Click Score: 9    End of Session   Activity Tolerance: Patient tolerated treatment well Patient left: with call bell/phone within reach;with bed alarm set;in bed;with family/visitor present Nurse Communication: Mobility status PT Visit Diagnosis: Other abnormalities of gait and mobility (R26.89);Muscle weakness (generalized) (M62.81)    Time: 1240-1310 PT Time Calculation (min) (ACUTE ONLY): 30 min   Charges:   PT Evaluation $PT Eval Low Complexity: 1 Low PT Treatments $Therapeutic Activity: 8-22 mins PT General Charges $$ ACUTE PT VISIT: 1 Visit         Marye Round, PT DPT Acute Rehabilitation Services Secure Chat Preferred  Office [phone]   Depaul Arizpe E Christain [place] 05/18/2023, 1:58 PM

## 2023-05-18 NOTE — Progress Notes (Signed)
Subjective: No further seizure-like episodes overnight.  Per son at bedside, patient appears to be improving.  ROS: negative except above  Examination  Vital signs in last 24 hours: Temp:  [97.8 F (36.6 C)-99 F (37.2 C)] 99 F (37.2 C) (10/31 1238) Pulse Rate:  [76-94] 76 (10/31 1238) Resp:  [16-39] 16 (10/31 1238) BP: (140-188)/(65-91) 163/73 (10/31 1238) SpO2:  [93 %-100 %] 97 % (10/31 1238)  General: lying in bed, NAD Neuro: AOx3, no aphasia, cranial nerves appear grossly intact, 4/5 in all 4 extremities, FTN intact bilaterally, sensations intact  Basic Metabolic Panel: Recent Labs  Lab 05/17/23 0918 05/18/23 0711  NA 137 136  K 4.0 3.2*  CL 105 104  CO2 20* 22  GLUCOSE 145* 99  BUN 21 12  CREATININE 0.80 0.79  CALCIUM 9.0 8.6*  MG 1.3* 1.9    CBC: Recent Labs  Lab 05/17/23 0918 05/18/23 0711  WBC 7.6 6.7  NEUTROABS 5.8 3.9  HGB 11.8* 11.3*  HCT 38.3 35.9*  MCV 84.9 82.7  PLT 296 300     Coagulation Studies: No results for input(s): "LABPROT", "INR" in the last 72 hours.  Imaging No new brain imaging overnight  ASSESSMENT AND PLAN: 89 year old female with history of dementia and epilepsy presented with breakthrough seizure-like episode.   Breakthrough seizure Hypomagnesemia Leukocytosis -Etiology: Reports medication compliance.  No clear infection.  Does have hypomagnesemia ( Mg 1.3)   Recommendations:  -Continue video EEG to look for ictal-interictal abnormality.  Plan to DC tomorrow if no further seizure-like episodes -Home lamotrigine: 25 mg twice daily.  Per son, patient continues to have breakthrough seizures therefore we will plan to increase to 50 mg at night and continue 25 mg during daytime for now.  If patient is unable to take p.o., I also ordered IV Vimpat -Continue seizure precautions -As needed IV Versed for clinical seizures -Discussed plan with sons at bedside   Thank you for allowing [place] to participate in the care of this  patient. If you have any further questions, please contact  me or neurohospitalist.   I have spent a total of 36  minutes with the patient reviewing hospital notes,  test results, labs and examining the patient as well as establishing an assessment and plan that was discussed personally with the patient.  > 50% of time was spent in direct patient care.       Lindie Spruce Epilepsy [facility] For questions after 5pm please refer to AMION to reach the Neurologist on call

## 2023-05-18 NOTE — Progress Notes (Signed)
vLTM maintenance  All impedances below 10k.  No skin breakdown noted at FP1  FP2  F4  Fz  F3

## 2023-05-19 ENCOUNTER — Other Ambulatory Visit (HOSPITAL_COMMUNITY): Payer: Self-pay

## 2023-05-19 DIAGNOSIS — G40919 Epilepsy, unspecified, intractable, without status epilepticus: Secondary | ICD-10-CM | POA: Diagnosis not present

## 2023-05-19 LAB — COMPREHENSIVE METABOLIC PANEL
ALT: 30 U/L (ref 0–44)
AST: 41 U/L (ref 15–41)
Albumin: 3.2 g/dL — ABNORMAL LOW (ref 3.5–5.0)
Alkaline Phosphatase: 54 U/L (ref 38–126)
Anion gap: 8 (ref 5–15)
BUN: 12 mg/dL (ref 8–23)
CO2: 23 mmol/L (ref 22–32)
Calcium: 9.1 mg/dL (ref 8.9–10.3)
Chloride: 106 mmol/L (ref 98–111)
Creatinine, Ser: 0.81 mg/dL (ref 0.44–1.00)
GFR, Estimated: >60 mL/min (ref >60)
Glucose, Bld: 159 mg/dL — ABNORMAL HIGH (ref 70–99)
Potassium: 3.6 mmol/L (ref 3.5–5.1)
Sodium: 137 mmol/L (ref 135–145)
Total Bilirubin: 0.5 mg/dL (ref 0.3–1.2)
Total Protein: 6.8 g/dL (ref 6.5–8.1)

## 2023-05-19 LAB — GLUCOSE, CAPILLARY
Glucose-Capillary: 165 mg/dL — ABNORMAL HIGH (ref 70–99)
Glucose-Capillary: 157 mg/dL — ABNORMAL HIGH (ref 70–99)
Glucose-Capillary: 165 mg/dL — ABNORMAL HIGH (ref 70–99)
Glucose-Capillary: 141 mg/dL — ABNORMAL HIGH (ref 70–99)
Glucose-Capillary: 138 mg/dL — ABNORMAL HIGH (ref 70–99)

## 2023-05-19 LAB — CBC
HCT: 39.6 % (ref 36.0–46.0)
Hemoglobin: 12.7 g/dL (ref 12.0–15.0)
MCH: 26.3 pg (ref 26.0–34.0)
MCHC: 32.1 g/dL (ref 30.0–36.0)
MCV: 82.2 fL (ref 80.0–100.0)
Platelets: 326 10*3/uL (ref 150–400)
RBC: 4.82 MIL/uL (ref 3.87–5.11)
RDW: 13.7 % (ref 11.5–15.5)
WBC: 7.4 10*3/uL (ref 4.0–10.5)
nRBC: 0 % (ref 0.0–0.2)

## 2023-05-19 LAB — URINE CULTURE: Culture: 50000 — AB

## 2023-05-19 MED ORDER — MELATONIN 5 MG PO TABS
5.0000 mg | ORAL_TABLET | Freq: Every day | ORAL | Status: DC
  Administered 2023-05-19 – 2023-05-22 (×4): 5 mg via ORAL
  Filled 2023-05-19 (×4): qty 1

## 2023-05-19 MED ORDER — QUETIAPINE FUMARATE 25 MG PO TABS
12.5000 mg | ORAL_TABLET | Freq: Every day | ORAL | Status: DC
  Administered 2023-05-19: 12.5 mg via ORAL
  Filled 2023-05-19: qty 1

## 2023-05-19 MED ORDER — LOPERAMIDE HCL 2 MG PO CAPS
2.0000 mg | ORAL_CAPSULE | ORAL | Status: DC | PRN
  Administered 2023-05-20: 2 mg via ORAL
  Filled 2023-05-19: qty 1

## 2023-05-19 MED ORDER — VALTOCO 15 MG DOSE 7.5 MG/0.1ML NA LQPK
15.0000 mg | NASAL | 0 refills | Status: DC | PRN
  Filled 2023-05-19: qty 10, 30d supply, fill #0

## 2023-05-19 NOTE — Procedures (Addendum)
Patient Name: ERMAGENE SAIDI  MRN: [id number]  Epilepsy Attending: Charlsie Quest  Referring Physician/Provider: Charlsie Quest, MD  Duration: 05/18/2023 1930 to 05/19/2023 7829   Patient history:  89 year old female with history of dementia and epilepsy presented with breakthrough seizure-like episode.    Level of alertness: Awake, asleep   AEDs during EEG study: LTG   Technical aspects: This EEG study was done with scalp electrodes positioned according to the 10-20 International system of electrode placement. Electrical activity was reviewed with band pass filter of 1-70Hz , sensitivity of 7 uV/mm, display speed of 34mm/sec with a 60Hz  notched filter applied as appropriate. EEG data were recorded continuously and digitally stored.  Video monitoring was available and reviewed as appropriate.   Description: EEG showed posterior dominant rhythm of 8 Hz activity of moderate voltage (25-35 uV) seen predominantly in posterior head regions, symmetric and reactive to eye opening and eye closing. Sleep was characterized by vertex waves, sleep spindles (12 to 14 Hz), maximal frontocentral region. Hyperventilation and photic stimulation were not performed.      IMPRESSION: This study is within normal limits. No seizures or definite epileptiform discharges were seen throughout the recording.   Lack of epileptiform activity during interictal EEG does not exclude the diagnosis of epilepsy.   Delylah Stanczyk Annabelle Harman

## 2023-05-19 NOTE — Plan of Care (Signed)
  Problem: Nutrition: Goal: Adequate nutrition will be maintained Outcome: Progressing   Problem: Safety: Goal: Ability to remain free from injury will improve Outcome: Progressing   Problem: Skin Integrity: Goal: Risk for impaired skin integrity will decrease Outcome: Progressing   

## 2023-05-19 NOTE — TOC Progression Note (Signed)
Transition of Care [facility]) - Progression Note    Patient Details  Name: Michelle [name] MRN: [id number] Date of Birth: 07/30/1932  Transition [facility] Care [facility]) CM/SW Contact  Baldemar Lenis, [place] Phone Number: 05/19/2023, 3:09 PM  Clinical Narrative:   CSW met with patient and son, Michelle [name], at bedside to discuss recommendation for SNF. Patient is in copay days, and family cannot afford the copays, but they also cannot provide the care that she needs at home, so they do not know what to do at this time. CSW discussed calling to check with insurance on patient's out of pocket maximum and how much of that she has met, as it may be covered. Son said he believes Phineas Semen did that at her recent stay and it was $8,000, and she had not fully met it yet, but they will call [employer] to verify. Son hopeful that maybe patient will improve with therapy at the hospital so that she can return home. Son also discussed patient's worsening hallucinations and lack of sleep, RN present in room during discussion and aware of concerns. CSW to follow.    Expected Discharge Plan: Skilled Nursing Facility Barriers to Discharge: Continued Medical Work up  Expected Discharge Plan and Services   Discharge Planning Services: CM Consult   Living arrangements for the past 2 months: Single Family Home                                       Social Determinants of Health (SDOH) Interventions SDOH Screenings   Food Insecurity: No Food Insecurity (05/17/2023)  Housing: Patient Declined (05/17/2023)  Transportation Needs: No Transportation Needs (05/17/2023)  Utilities: Not At Risk (05/17/2023)  Depression (PHQ2-9): Medium Risk (08/19/2022)  Financial Resource Strain: Low Risk  (06/20/2022)  Physical Activity: Inactive (06/20/2022)  Social Connections: Socially Isolated (06/20/2022)  Stress: No Stress Concern Present (06/20/2022)  Tobacco Use: Medium Risk (05/17/2023)    Readmission Risk Interventions    09/01/2020    12:30 PM  Readmission Risk Prevention Plan  Transportation Screening Complete  PCP or Specialist Appt within 3-5 Days Complete  HRI or Home Care Consult Complete  Social Work Consult for Recovery Care Planning/Counseling Complete  Palliative Care Screening Not Applicable  Medication Review Oceanographer) Complete

## 2023-05-19 NOTE — Progress Notes (Signed)
  Progress Note   Patient: Michelle [name] QMV:[id number] DOB: [birth year] DOA: 05/17/2023     1 DOS: the patient was seen and examined on 05/19/2023   Brief hospital course: 89 year old Caucasian female history of seizures, type 2 diabetes, hypertension, chronic diastolic heart failure, wheelchair bound, presents to the ER today with a seizure at home.  Patient lives with her son [name].  Patient does not manage any of her medications.  This is performed by the family.  Son found the patient having a seizure this morning.  Last known normal was last night.  Was still having altered mental status on presentation.  Had a myoclonic seizure in the ER.  was given IV Ativan in the ER.  Patient seen by neurology.  She was loaded with IV Keppra.  She is normally on Lamictal 25 mg twice daily.   Patient is been seen by neurology.  Overnight EEG has been ordered.   [facility] consulted for admission.  Assessment and Plan:  Breakthrough seizure (HCC) Admit to progressive neurology bed with telemetry for breakthrough seizures.  Neurology following. Recs to continue EEG. Lamotrigine increased to 50mg  at bedtime and continue 25mg  daily -Neurology recs for intranasal Valtoco 15mg  for clinical seizure lasting more than   Wheelchair dependence Chronic.  Son [name] states that patient walks minimally at home. PT/OT consulted, recs for SNF noted   Chronic diastolic CHF (congestive heart failure) (HCC) Patient remains euvolemic.   COPD (chronic obstructive pulmonary disease) (HCC) Stable.  Use nebulizer treatments while she is still altered.   Essential hypertension Continue home meds as tolerated   Hyperlipidemia associated with type 2 diabetes mellitus (HCC) Resume home statin   Type 2 diabetes mellitus without complication, with long-term current use of insulin (HCC) Continue SSI as needed Glycemic trends stable   Hypomagnesemia Replaced   Diarrhea -Unclear etiology -Pt tolerating  PO. Staff reports stool as "loose" and not "watery," pt afebrile -will give trial of imodium   Toxic metabolic encephalopathy -more confused overnight and this AM, pulling at lines -Pt reports no sleep at all overnight. This is likely the etiology -Already on melatonin. Discussed with Neurology, can give trial of low dose seroquel     Subjective: More confused this AM  Physical Exam: Vitals:   05/19/23 0832 05/19/23 0850 05/19/23 1237 05/19/23 1613  BP: (!) 175/77 (!) 152/70 (!) 156/87 (!) 167/73  Pulse: 78  75 74  Resp:   19 17  Temp: 98.1 F (36.7 C)  98.6 F (37 C) 98 F (36.7 C)  TempSrc: Oral  Oral Oral  SpO2: 98%  98% 95%  Weight:       General exam: Awake, laying in bed, in nad Respiratory system: Normal respiratory effort, no wheezing Cardiovascular system: regular rate, s1, s2 Gastrointestinal system: Soft, nondistended, positive BS Central nervous system: CN2-12 grossly intact, strength intact Extremities: Perfused, no clubbing Skin: Normal skin turgor, no notable skin lesions seen Psychiatry: difficult to assess. Pt confused  Data Reviewed:  Labs reviewed: Na 137, K 3.6, Cr 0.81, WBC 7.4, Hgb 12.7, Plts 326  Family Communication: Pt in room, pt's sons at bedside  Disposition: Status is: Inpatient Continue inpatient stay because: severity of illness  Planned Discharge Destination: Home     Author: Rickey Barbara, MD 05/19/2023 4:38 PM  For on call review [email or web address].

## 2023-05-19 NOTE — Evaluation (Signed)
Occupational Therapy Evaluation Patient Details Name: Michelle [name] MRN: [id number] DOB: [birth year] Today's Date: 05/19/2023   History of Present Illness 89 yo female presents to ED on 10/30 with witnessed seizure. PMH includes HTN, seizures, DMII, HLD, CAD, COPD.   Clinical Impression   [name] was evaluated s/p the above admission list. She lives with her son, needs assist with ADLs and can only walk short distances with RW at baseline. Upon evaluation the pt was limited by impaired cognition, LB pain, confusion, hallucinations?, poor sitting balance, generalized weakness and limited activity tolerance. Overall she needed max A for bed mobility and CGA to min A for sitting balance. Pt was able to stand with the stedy frame from an elevated bed 2x and min A. Due to the deficits listed below the pt also needs up to total A for LB ADLs and mod A for UB ADLs. Pt will benefit from continued acute OT services and skilled inpatient follow up therapy, <3 hours/day.        If plan is discharge home, recommend the following: A lot of help with walking and/or transfers;A lot of help with bathing/dressing/bathroom;Assistance with cooking/housework;Direct supervision/assist for financial management;Direct supervision/assist for medications management;Assist for transportation;Help with stairs or ramp for entrance    Functional Status Assessment  Patient has had a recent decline in their functional status and demonstrates the ability to make significant improvements in function in a reasonable and predictable amount of time.  Equipment Recommendations  Hoyer lift       Precautions / Restrictions Precautions Precautions: Fall Restrictions Weight Bearing Restrictions: Yes      Mobility Bed Mobility Overal bed mobility: Needs Assistance Bed Mobility: Supine to Sit, Rolling, Sit to Supine Rolling: Min assist   Supine to sit: Max assist Sit to supine: Max assist        Transfers Overall  transfer level: Needs assistance Equipment used: Ambulation equipment used Transfers: Sit to/from Stand Sit to Stand: Min assist           General transfer comment: min A with stedy frame with bed elevated Transfer via Lift Equipment: Stedy    Balance Overall balance assessment: Needs assistance Sitting-balance support: No upper extremity supported, Feet supported Sitting balance-Leahy Scale: Poor Sitting balance - Comments: CGA-min A for sitting balance     Standing balance-Leahy Scale: Poor Standing balance comment: tolerated standing in stedy frame for ~5 seconds at a time           ADL either performed or assessed with clinical judgement   ADL Overall ADL's : Needs assistance/impaired Eating/Feeding: Set up   Grooming: Minimal assistance   Upper Body Bathing: Moderate assistance   Lower Body Bathing: Maximal assistance   Upper Body Dressing : Moderate assistance   Lower Body Dressing: Total assistance   Toilet Transfer: Total assistance Toilet Transfer Details (indicate cue type and reason): with stedy Toileting- Clothing Manipulation and Hygiene: Total assistance       Functional mobility during ADLs: Maximal assistance General ADL Comments: limited by weakness and cognition     Vision Baseline Vision/History: 0 No visual deficits Vision Assessment?: No apparent visual deficits     Perception Perception: Not tested       Praxis Praxis: Not tested       Pertinent Vitals/Pain Pain Assessment Pain Assessment: Faces Faces Pain Scale: Hurts little more Pain Location: rear Pain Descriptors / Indicators: Discomfort, Grimacing, Guarding Pain Intervention(s): Limited activity within patient's tolerance, Monitored during session     Extremity/Trunk  Assessment Upper Extremity Assessment Upper Extremity Assessment: Generalized weakness   Lower Extremity Assessment Lower Extremity Assessment: Defer to PT evaluation   Cervical / Trunk  Assessment Cervical / Trunk Assessment: Kyphotic   Communication Communication Communication: No apparent difficulties   Cognition Arousal: Lethargic Behavior During Therapy: Flat affect Overall Cognitive Status: Impaired/Different from baseline Area of Impairment: Orientation, Memory, Following commands, Safety/judgement, Awareness, Problem solving                 Orientation Level: Disoriented to, Situation   Memory: Decreased short-term memory Following Commands: Follows one step commands with increased time Safety/Judgement: Decreased awareness of safety, Decreased awareness of deficits Awareness: Intellectual Problem Solving: Slow processing, Decreased initiation, Difficulty sequencing, Requires verbal cues, Requires tactile cues General Comments: pt on bed pan on arrival, unaware. oritneted to everything with exception of situation. followed most simple commands with increased time. very limited insight. moments of confusion. pt's son present and reports she has been having visual and auditory hallucinations today     General Comments  VSS. son present. Son reports pt has been on the bed pen roughly 4 hours, pan removed with obvious reding on pts skin. RN made aware.            Home Living Family/patient expects to be discharged to:: Private residence Living Arrangements: Children Available Help at Discharge: Family;Available 24 hours/day Type of Home: House Home Access: Ramped entrance     Home Layout: One level     Bathroom Shower/Tub: Producer, television/film/video: Standard Bathroom Accessibility: Yes   Home Equipment: Rollator (4 wheels);Rolling Walker (2 wheels);BSC/3in1;Shower seat;Wheelchair - manual   Additional Comments: lives with son      Prior Functioning/Environment Prior Level of Function : Needs assist             Mobility Comments: uses RW for short distance gait, they also have a w/c ADLs Comments: needs assist for most ADLs         OT Problem List: Decreased strength;Decreased range of motion;Decreased activity tolerance;Impaired balance (sitting and/or standing);Decreased safety awareness;Decreased knowledge of use of DME or AE;Decreased knowledge of precautions;Decreased cognition      OT Treatment/Interventions: Self-care/ADL training;Therapeutic exercise;DME and/or AE instruction;Therapeutic activities;Patient/family education;Balance training    OT Goals(Current goals can be found in the care plan section) Acute Rehab OT Goals Patient Stated Goal: to go home OT Goal Formulation: With patient Time For Goal Achievement: 06/02/23 Potential to Achieve Goals: Good ADL Goals Pt Will Perform Grooming: with set-up Pt Will Perform Lower Body Dressing: with mod assist;sit to/from stand Pt Will Transfer to Toilet: with mod assist;stand pivot transfer;bedside commode Additional ADL Goal #1: pt will complete bed mobility with min A as a precursor to ADLs Additional ADL Goal #2: Pt will follow 100% of 1 step commands to demonstrate improved cognition for ADLs  OT Frequency: Min 1X/week       AM-PAC OT "6 Clicks" Daily Activity     Outcome Measure Help from another person eating meals?: A Little Help from another person taking care of personal grooming?: A Little Help from another person toileting, which includes using toliet, bedpan, or urinal?: Total Help from another person bathing (including washing, rinsing, drying)?: A Lot Help from another person to put on and taking off regular upper body clothing?: A Lot Help from another person to put on and taking off regular lower body clothing?: Total 6 Click Score: 12   End of Session Equipment Utilized During Treatment: Gait  belt Nurse Communication: Mobility status  Activity Tolerance: Patient tolerated treatment well Patient left: in bed;with bed alarm set;with call bell/phone within reach;with family/visitor present  OT Visit Diagnosis: Other abnormalities  of gait and mobility (R26.89);Muscle weakness (generalized) (M62.81);Unsteadiness on feet (R26.81);History of falling (Z91.81)                Time: 1610-9604 OT Time Calculation (min): 23 min Charges:  OT General Charges $OT Visit: 1 Visit OT Evaluation $OT Eval Moderate Complexity: 1 Mod OT Treatments $Self Care/Home Management : 8-22 mins  Derenda Mis, OTR/L Acute Rehabilitation Services Office [phone] Secure Chat Communication Preferred   Donia Pounds 05/19/2023, 3:22 PM

## 2023-05-19 NOTE — Progress Notes (Signed)
vLTM maintenance  All impedances below 10kohms.  No skin breakdown noted at  FP1  FP2  A1  A2

## 2023-05-19 NOTE — Telephone Encounter (Signed)
VOB initiated for Zilretta for BILAT knee OA 

## 2023-05-19 NOTE — Progress Notes (Signed)
LTM EEG discontinued - no skin breakdown at unhook.   

## 2023-05-19 NOTE — Progress Notes (Signed)
She pulled out her IV and was trying to pull off EEG leads. Had on mittens but got them off and was still pulling at EEG leads.  Called MD to see if OK to leave out since now has po Vimpat and EEG is now discontinued.  He said we could leave it out at this time.

## 2023-05-19 NOTE — Progress Notes (Addendum)
Subjective: No acute events overnight.  Per son at bedside, patient was getting confused and pulling at lines but states she often gets confused at home if she does not get sleep overnight  ROS: negative except above Examination  Vital signs in last 24 hours: Temp:  [98.1 F (36.7 C)-99 F (37.2 C)] 98.1 F (36.7 C) (11/01 0832) Pulse Rate:  [76-92] 78 (11/01 0832) Resp:  [16-29] 19 (11/01 0400) BP: (156-188)/(70-79) 175/77 (11/01 0832) SpO2:  [95 %-98 %] 98 % (11/01 0832) Weight:  [75.3 kg] 75.3 kg (11/01 0607)  General: lying in bed, NAD Neuro: AOx3, no aphasia, cranial nerves appear grossly intact, 4/5 in all 4 extremities, FTN intact bilaterally, sensations intact  Basic Metabolic Panel: Recent Labs  Lab 05/17/23 0918 05/18/23 0711 05/19/23 0516  NA 137 136 137  K 4.0 3.2* 3.6  CL 105 104 106  CO2 20* 22 23  GLUCOSE 145* 99 159*  BUN 21 12 12   CREATININE 0.80 0.79 0.81  CALCIUM 9.0 8.6* 9.1  MG 1.3* 1.9  --     CBC: Recent Labs  Lab 05/17/23 0918 05/18/23 0711 05/19/23 0516  WBC 7.6 6.7 7.4  NEUTROABS 5.8 3.9  --   HGB 11.8* 11.3* 12.7  HCT 38.3 35.9* 39.6  MCV 84.9 82.7 82.2  PLT 296 300 326     Coagulation Studies: No results for input(s): "LABPROT", "INR" in the last 72 hours.  Imaging No new brain imaging overnight  ASSESSMENT AND PLAN:  89 year old female with history of dementia and epilepsy presented with breakthrough seizure-like episode.   Breakthrough seizure Delirium -Etiology: Reports medication compliance.  No clear infection.  Does have hypomagnesemia ( Mg 1.3)   Recommendations:  -DC LTM EEG as no further seizures -Home lamotrigine was 25 mg twice daily.  Due to suspected breakthrough seizure, increased to lamotrigine 25 mg every morning and 50 mg nightly -Rest medication: Intranasal Valtoco 15 mg for clinical seizure lasting more than 2 minutes -Continue seizure precautions, delirium precautions -As needed IV Versed for clinical  seizures -Discussed plan with sons at bedside -Discussed plan with Dr. Rhona Leavens via secure chat -Continue follow-up with Dr. Karel Jarvis at [facility] neurology  Seizure precautions: Per [facility] statutes, patients with seizures are not allowed to drive until they have been seizure-free for six months and cleared by a physician    Use caution when using heavy equipment or power tools. Avoid working on ladders or at heights. Take showers instead of baths. Ensure the water temperature is not too high on the home water heater. Do not go swimming alone. Do not lock yourself in a room alone (i.e. bathroom). When caring for infants or small children, sit down when holding, feeding, or changing them to minimize risk of injury to the child in the event you have a seizure. Maintain good sleep hygiene. Avoid alcohol.    If patient has another seizure, call 911 and bring them back to the ED if: A.  The seizure lasts longer than 5 minutes.      B.  The patient doesn't wake shortly after the seizure or has new problems such as difficulty seeing, speaking or moving following the seizure C.  The patient was injured during the seizure D.  The patient has a temperature over 102 F (39C) E.  The patient vomited during the seizure and now is having trouble breathing    During the Seizure   - First, ensure adequate ventilation and place patients on the floor  on their left side  Loosen clothing around the neck and ensure the airway is patent. If the patient is clenching the teeth, do not force the mouth open with any object as this can cause severe damage - Remove all items from the surrounding that can be hazardous. The patient may be oblivious to what's happening and may not even know what he or she is doing. If the patient is confused and wandering, either gently guide him/her away and block access to outside areas - Reassure the individual and be comforting - Call 911. In most cases, the seizure ends before EMS  arrives. However, there are cases when seizures may last over 3 to 5 minutes. Or the individual may have developed breathing difficulties or severe injuries. If a pregnant patient or a person with diabetes develops a seizure, it is prudent to call an ambulance.    After the Seizure (Postictal Stage)   After a seizure, most patients experience confusion, fatigue, muscle pain and/or a headache. Thus, one should permit the individual to sleep. For the next few days, reassurance is essential. Being calm and helping reorient the person is also of importance.   Most seizures are painless and end spontaneously. Seizures are not harmful to others but can lead to complications such as stress on the lungs, brain and the heart. Individuals with prior lung problems may develop labored breathing and respiratory distress.    Thank you for allowing [place] to participate in the care of this patient. If you have any further questions, please contact  me or neurohospitalist.    I have spent a total of 37  minutes with the patient reviewing hospital notes,  test results, labs and examining the patient as well as establishing an assessment and plan that was discussed personally with the patient.  > 50% of time was spent in direct patient care.      Lindie Spruce Epilepsy [facility] For questions after 5pm please refer to AMION to reach the Neurologist on call

## 2023-05-19 NOTE — Plan of Care (Signed)
  Problem: Elimination: Goal: Will not experience complications related to urinary retention Outcome: Progressing   Problem: Coping: Goal: Ability to adjust to condition or change in health will improve Outcome: Not Progressing   Problem: Health Behavior/Discharge Planning: Goal: Ability to manage health-related needs will improve Outcome: Not Progressing   Problem: Skin Integrity: Goal: Risk for impaired skin integrity will decrease Outcome: Not Progressing   Problem: Education: Goal: Knowledge of General Education information will improve Description: Including pain rating scale, medication(s)/side effects and non-pharmacologic comfort measures Outcome: Not Progressing   Problem: Elimination: Goal: Will not experience complications related to bowel motility Outcome: Not Progressing

## 2023-05-19 NOTE — TOC Benefit Eligibility Note (Signed)
Patient Product/process development scientist completed.    The patient is insured through [place]. Patient has Medicare and is not eligible for a copay card, but may be able to apply for patient assistance, if available.    Ran test claim for Valtoco 15 mg and the current 30 day co-pay is $0.00.   This test claim was processed through [facility]- copay amounts may vary at other pharmacies due to pharmacy/plan contracts, or as the patient moves through the different stages of their insurance plan.     Roland Earl, CPHT Pharmacy Technician III Certified Patient Advocate [facility] Patient Advocate Team Direct Number: [phone]  Fax: [phone]

## 2023-05-20 DIAGNOSIS — G40919 Epilepsy, unspecified, intractable, without status epilepticus: Secondary | ICD-10-CM | POA: Diagnosis not present

## 2023-05-20 LAB — GLUCOSE, CAPILLARY
Glucose-Capillary: 151 mg/dL — ABNORMAL HIGH (ref 70–99)
Glucose-Capillary: 172 mg/dL — ABNORMAL HIGH (ref 70–99)
Glucose-Capillary: 136 mg/dL — ABNORMAL HIGH (ref 70–99)
Glucose-Capillary: 154 mg/dL — ABNORMAL HIGH (ref 70–99)

## 2023-05-20 LAB — CBC
HCT: 37.8 % (ref 36.0–46.0)
Hemoglobin: 12.3 g/dL (ref 12.0–15.0)
MCH: 26.2 pg (ref 26.0–34.0)
MCHC: 32.5 g/dL (ref 30.0–36.0)
MCV: 80.4 fL (ref 80.0–100.0)
Platelets: 343 10*3/uL (ref 150–400)
RBC: 4.7 MIL/uL (ref 3.87–5.11)
RDW: 13.7 % (ref 11.5–15.5)
WBC: 8.9 10*3/uL (ref 4.0–10.5)
nRBC: 0 % (ref 0.0–0.2)

## 2023-05-20 LAB — COMPREHENSIVE METABOLIC PANEL
ALT: 33 U/L (ref 0–44)
AST: 38 U/L (ref 15–41)
Albumin: 3.1 g/dL — ABNORMAL LOW (ref 3.5–5.0)
Alkaline Phosphatase: 50 U/L (ref 38–126)
Anion gap: 9 (ref 5–15)
BUN: 16 mg/dL (ref 8–23)
CO2: 23 mmol/L (ref 22–32)
Calcium: 9.3 mg/dL (ref 8.9–10.3)
Chloride: 106 mmol/L (ref 98–111)
Creatinine, Ser: 0.82 mg/dL (ref 0.44–1.00)
GFR, Estimated: >60 mL/min (ref >60)
Glucose, Bld: 150 mg/dL — ABNORMAL HIGH (ref 70–99)
Potassium: 3.6 mmol/L (ref 3.5–5.1)
Sodium: 138 mmol/L (ref 135–145)
Total Bilirubin: 0.4 mg/dL (ref 0.3–1.2)
Total Protein: 6.5 g/dL (ref 6.5–8.1)

## 2023-05-20 MED ORDER — QUETIAPINE FUMARATE 25 MG PO TABS
25.0000 mg | ORAL_TABLET | Freq: Every day | ORAL | Status: DC
  Administered 2023-05-20 – 2023-05-21 (×2): 25 mg via ORAL
  Filled 2023-05-20 (×2): qty 1

## 2023-05-20 NOTE — Plan of Care (Signed)
  Problem: Skin Integrity: Goal: Risk for impaired skin integrity will decrease Outcome: Progressing   Problem: Tissue Perfusion: Goal: Adequacy of tissue perfusion will improve Outcome: Progressing   Problem: Education: Goal: Knowledge of General Education information will improve Description: Including pain rating scale, medication(s)/side effects and non-pharmacologic comfort measures Outcome: Progressing   Problem: Nutrition: Goal: Adequate nutrition will be maintained Outcome: Progressing   Problem: Safety: Goal: Ability to remain free from injury will improve Outcome: Progressing   Problem: Skin Integrity: Goal: Risk for impaired skin integrity will decrease Outcome: Progressing

## 2023-05-20 NOTE — Plan of Care (Signed)
  Problem: Metabolic: Goal: Ability to maintain appropriate glucose levels will improve Outcome: Progressing   Problem: Tissue Perfusion: Goal: Adequacy of tissue perfusion will improve Outcome: Progressing   Problem: Elimination: Goal: Will not experience complications related to urinary retention Outcome: Progressing   Problem: Coping: Goal: Ability to adjust to condition or change in health will improve Outcome: Not Progressing   Problem: Health Behavior/Discharge Planning: Goal: Ability to manage health-related needs will improve Outcome: Not Progressing   Problem: Education: Goal: Knowledge of General Education information will improve Description: Including pain rating scale, medication(s)/side effects and non-pharmacologic comfort measures Outcome: Not Progressing

## 2023-05-20 NOTE — Progress Notes (Signed)
  Progress Note   Patient: Michelle [name] OZH:[id number] DOB: 1932-12-30 DOA: 05/17/2023     2 DOS: the patient was seen and examined on 05/20/2023   Brief hospital course: 89 year old Caucasian female history of seizures, type 2 diabetes, hypertension, chronic diastolic heart failure, wheelchair bound, presents to the ER today with a seizure at home.  Patient lives with her son [name].  Patient does not manage any of her medications.  This is performed by the family.  Son found the patient having a seizure this morning.  Last known normal was last night.  Was still having altered mental status on presentation.  Had a myoclonic seizure in the ER.  was given IV Ativan in the ER.  Patient seen by neurology.  She was loaded with IV Keppra.  She is normally on Lamictal 25 mg twice daily.   Patient is been seen by neurology.  Overnight EEG has been ordered.   [facility] hospitalist consulted for admission.  Assessment and Plan:  Breakthrough seizure (HCC) Admit to progressive neurology bed with telemetry for breakthrough seizures.  Neurology following. Recs to continue EEG. Lamotrigine increased to 50mg  at bedtime and continue 25mg  daily -Neurology recs for intranasal Valtoco 15mg  for clinical seizure lasting more than   Wheelchair dependence Chronic.  Son [name] states that patient walks minimally at home. PT/OT consulted, recs for SNF noted   Chronic diastolic CHF (congestive heart failure) (HCC) Patient remains euvolemic.   COPD (chronic obstructive pulmonary disease) (HCC) Stable.  Use nebulizer treatments while she is still altered.   Essential hypertension Continue home meds as tolerated   Hyperlipidemia associated with type 2 diabetes mellitus (HCC) Resume home statin   Type 2 diabetes mellitus without complication, with long-term current use of insulin (HCC) Continue SSI as needed Glycemic trends stable   Hypomagnesemia Replaced   Diarrhea -Unclear etiology -Continue imodium  as needed  Toxic metabolic encephalopathy -remains confused and not sleeping -This is likely the etiology for confusion -Already on melatonin. Currently on low-dose seroquel. Discuss with pharmacy if it is possible to up-titrate seroquel with her seizure meds.     Subjective: Did not sleep last night. Confused and droway  Physical Exam: Vitals:   05/20/23 0438 05/20/23 0500 05/20/23 0725 05/20/23 1108  BP: (!) 156/67  (!) 157/68 130/72  Pulse: 77  80 78  Resp: 18 13 19 18   Temp: 97.8 F (36.6 C)  98.4 F (36.9 C) 98.7 F (37.1 C)  TempSrc: Oral  Oral Oral  SpO2: 98%  98% 97%  Weight:  76.4 kg     General exam: Conversant, in no acute distress Respiratory system: normal chest rise, clear, no audible wheezing Cardiovascular system: regular rhythm, s1-s2 Gastrointestinal system: Nondistended, nontender, pos BS Central nervous system: No seizures, no tremors Extremities: No cyanosis, no joint deformities Skin: No rashes, no pallor Psychiatry: Affect normal // no auditory hallucinations   Data Reviewed:  Labs reviewed: Na 138, K 3.6, Cr 0.82, WBC 8.9, Hgb 12.3, Plts 343  Family Communication: Pt in room, pt's son at bedside  Disposition: Status is: Inpatient Continue inpatient stay because: severity of illness  Planned Discharge Destination: Home     Author: Rickey Barbara, MD 05/20/2023 3:02 PM  For on call review [email or web address].

## 2023-05-20 NOTE — Progress Notes (Signed)
Moved from room 3W 14 to 3W 15 with approval of son [name].  Oriented to room.  No complaints.

## 2023-05-21 DIAGNOSIS — G40919 Epilepsy, unspecified, intractable, without status epilepticus: Secondary | ICD-10-CM | POA: Diagnosis not present

## 2023-05-21 LAB — TROPONIN I (HIGH SENSITIVITY)
Troponin I (High Sensitivity): 15 ng/L (ref <18)
Troponin I (High Sensitivity): 16 ng/L (ref <18)

## 2023-05-21 LAB — GLUCOSE, CAPILLARY
Glucose-Capillary: 127 mg/dL — ABNORMAL HIGH (ref 70–99)
Glucose-Capillary: 133 mg/dL — ABNORMAL HIGH (ref 70–99)
Glucose-Capillary: 167 mg/dL — ABNORMAL HIGH (ref 70–99)
Glucose-Capillary: 166 mg/dL — ABNORMAL HIGH (ref 70–99)

## 2023-05-21 MED ORDER — ACETAMINOPHEN 325 MG PO TABS
650.0000 mg | ORAL_TABLET | Freq: Four times a day (QID) | ORAL | Status: DC | PRN
  Administered 2023-05-21 – 2023-05-25 (×2): 650 mg via ORAL
  Filled 2023-05-21 (×2): qty 2

## 2023-05-21 MED ORDER — PANTOPRAZOLE SODIUM 40 MG PO TBEC
40.0000 mg | DELAYED_RELEASE_TABLET | Freq: Every day | ORAL | Status: DC
  Administered 2023-05-21 – 2023-05-26 (×6): 40 mg via ORAL
  Filled 2023-05-21 (×6): qty 1

## 2023-05-21 MED ORDER — NITROGLYCERIN 0.4 MG SL SUBL
0.4000 mg | SUBLINGUAL_TABLET | SUBLINGUAL | Status: DC | PRN
  Administered 2023-05-21 (×2): 0.4 mg via SUBLINGUAL
  Filled 2023-05-21: qty 1

## 2023-05-21 MED ORDER — CALCIUM CARBONATE ANTACID 500 MG PO CHEW
1.0000 | CHEWABLE_TABLET | Freq: Three times a day (TID) | ORAL | Status: DC | PRN
  Administered 2023-05-21: 200 mg via ORAL
  Filled 2023-05-21: qty 1

## 2023-05-21 NOTE — Plan of Care (Signed)
  Problem: Tissue Perfusion: Goal: Adequacy of tissue perfusion will improve Outcome: Progressing   Problem: Skin Integrity: Goal: Risk for impaired skin integrity will decrease Outcome: Progressing   Problem: Nutritional: Goal: Progress toward achieving an optimal weight will improve Outcome: Progressing   Problem: Nutritional: Goal: Maintenance of adequate nutrition will improve Outcome: Progressing

## 2023-05-21 NOTE — Progress Notes (Signed)
Progress Note   Patient: Michelle [name] ZOX:[id number] DOB: September 12, 1932 DOA: 05/17/2023     3 DOS: the patient was seen and examined on 05/21/2023   Brief hospital course: 89 year old Caucasian female history of seizures, type 2 diabetes, hypertension, chronic diastolic heart failure, wheelchair bound, presents to the ER today with a seizure at home.  Patient lives with her son [name].  Patient does not manage any of her medications.  This is performed by the family.  Son found the patient having a seizure this morning.  Last known normal was last night.  Was still having altered mental status on presentation.  Had a myoclonic seizure in the ER.  was given IV Ativan in the ER.  Patient seen by neurology.  She was loaded with IV Keppra.  She is normally on Lamictal 25 mg twice daily.   Patient is been seen by neurology.  Overnight EEG has been ordered.   [facility] hospitalist consulted for admission.  Assessment and Plan:  Breakthrough seizure (HCC) Admit to progressive neurology bed with telemetry for breakthrough seizures.  Neurology following. Recs to continue EEG. Lamotrigine increased to 50mg  at bedtime and continue 25mg  daily -Neurology recs for intranasal Valtoco 15mg  for clinical seizure lasting more than   Wheelchair dependence Chronic.  Son [name] states that patient walks minimally at home. PT/OT consulted, recs for SNF noted   Chronic diastolic CHF (congestive heart failure) (HCC) Patient remains euvolemic.   COPD (chronic obstructive pulmonary disease) (HCC) Stable.  Use nebulizer treatments while she is still altered.   Essential hypertension Continue home meds as tolerated   Hyperlipidemia associated with type 2 diabetes mellitus (HCC) Resume home statin   Type 2 diabetes mellitus without complication, with long-term current use of insulin (HCC) Continue SSI as needed Glycemic trends stable   Hypomagnesemia Replaced   Diarrhea -Unclear etiology -Continue imodium  as needed  Toxic metabolic encephalopathy -remains confused and not sleeping -This is likely the etiology for confusion -Already on melatonin. Now improved after trial of 25mg  seroquel. Will continue for now  CAD -Known CAD s/p angioplasty in 1990 and stents placed in 2015 -Pt is know to [facility] group -this afternoon, pt complained of substernal chest pain with R shoulder pain -Reviewed EKG with Cardiology on call. Appears to have new T wave inversions at V4-V5. Trop is 15 -Per Cardiology, recommendation to f/u on serial trop. If trop trends up, would formally involve Cardiology -Will order limited 2d echo     Subjective: Sleeping much better since starting seroquel  Physical Exam: Vitals:   05/21/23 0716 05/21/23 0741 05/21/23 1054 05/21/23 1611  BP: (!) 184/75 (!) 154/72 (!) 127/52 (!) 179/62  Pulse: 81  63 74  Resp: 19  17 18   Temp: 98.2 F (36.8 C)  98 F (36.7 C) 98 F (36.7 C)  TempSrc: Oral  Oral Oral  SpO2: 96%  100% 97%  Weight:      Height:       General exam: Asleep, laying in bed, in nad Respiratory system: Normal respiratory effort, no wheezing Cardiovascular system: regular rate, s1, s2 Gastrointestinal system: Soft, nondistended, positive BS Central nervous system: CN2-12 grossly intact, strength intact Extremities: Perfused, no clubbing Skin: Normal skin turgor, no notable skin lesions seen Psychiatry: unable to assess given mentation  Data Reviewed:  Labs reviewed: Na 138, K 3.6, Cr 0.82, WBC 8.9, Hgb 12.3, Plts 343  Family Communication: Pt in room, pt's son at bedside  Disposition: Status is: Inpatient Continue  inpatient stay because: severity of illness  Planned Discharge Destination: Skilled nursing facility     Author: Rickey Barbara, MD 05/21/2023 4:44 PM  For on call review [email or web address].

## 2023-05-21 NOTE — Progress Notes (Signed)
Had c/o headache and chest pain and MD called to check on her and was told of her pain and he ordered nitroglycerin tabs and she her pain was gone after Tylenol given and 2 doses of nitroglycerin sublingually.  Resting comfortably in the bed.  No complaints at this time.

## 2023-05-21 NOTE — Plan of Care (Signed)
  Problem: Education: Goal: Ability to describe self-care measures that may prevent or decrease complications (Diabetes Survival Skills Education) will improve Outcome: Progressing Goal: Individualized Educational Video(s) Outcome: Progressing   Problem: Coping: Goal: Ability to adjust to condition or change in health will improve Outcome: Progressing   Problem: Fluid Volume: Goal: Ability to maintain a balanced intake and output will improve Outcome: Progressing   Problem: Health Behavior/Discharge Planning: Goal: Ability to identify and utilize available resources and services will improve Outcome: Progressing Goal: Ability to manage health-related needs will improve Outcome: Progressing   Problem: Metabolic: Goal: Ability to maintain appropriate glucose levels will improve Outcome: Progressing   Problem: Nutritional: Goal: Maintenance of adequate nutrition will improve Outcome: Progressing Goal: Progress toward achieving an optimal weight will improve Outcome: Progressing   Problem: Skin Integrity: Goal: Risk for impaired skin integrity will decrease Outcome: Progressing   Problem: Tissue Perfusion: Goal: Adequacy of tissue perfusion will improve Outcome: Progressing   Problem: Education: Goal: Knowledge of General Education information will improve Description: Including pain rating scale, medication(s)/side effects and non-pharmacologic comfort measures Outcome: Progressing   Problem: Nutrition: Goal: Adequate nutrition will be maintained Outcome: Progressing   Problem: Elimination: Goal: Will not experience complications related to bowel motility Outcome: Progressing Goal: Will not experience complications related to urinary retention Outcome: Progressing   Problem: Safety: Goal: Ability to remain free from injury will improve Outcome: Progressing   Problem: Skin Integrity: Goal: Risk for impaired skin integrity will decrease Outcome: Progressing

## 2023-05-21 NOTE — Progress Notes (Signed)
Paged MD because c/o right shoulder/chest area pain and right side of head pain. At first had c/o chest hurting and upon assessment, she said it is more like the food is getting stuck or something and is very uncomfortable. She was eating greenbeans and baked potato. I did receive order for tums and she did receive the tums and said there was no change.  Bedside EKG was done and MD also ordered troponin.  Now is resting and said it just feels like burning in her throat area and discomfort.  Did raise the head of her bed above 30 degrees.  Today she c/o burning after breakfast when she drank 3 cups of grape juice.  This time she c/o the discomfort while eating lunch and after drinking grape juice as well.  Sons stepped out of the room while EKG done.  She is resting at this time and lab is at bedside to draw troponin.  Said she feels a little better at this time.  Denies need for pain med.

## 2023-05-22 ENCOUNTER — Inpatient Hospital Stay (HOSPITAL_COMMUNITY): Payer: Medicare HMO

## 2023-05-22 ENCOUNTER — Telehealth: Payer: Self-pay | Admitting: Family Medicine

## 2023-05-22 DIAGNOSIS — G40919 Epilepsy, unspecified, intractable, without status epilepticus: Secondary | ICD-10-CM | POA: Diagnosis not present

## 2023-05-22 DIAGNOSIS — R079 Chest pain, unspecified: Secondary | ICD-10-CM

## 2023-05-22 LAB — GLUCOSE, CAPILLARY
Glucose-Capillary: 225 mg/dL — ABNORMAL HIGH (ref 70–99)
Glucose-Capillary: 165 mg/dL — ABNORMAL HIGH (ref 70–99)
Glucose-Capillary: 166 mg/dL — ABNORMAL HIGH (ref 70–99)
Glucose-Capillary: 149 mg/dL — ABNORMAL HIGH (ref 70–99)

## 2023-05-22 LAB — COMPREHENSIVE METABOLIC PANEL
ALT: 30 U/L (ref 0–44)
AST: 29 U/L (ref 15–41)
Albumin: 3 g/dL — ABNORMAL LOW (ref 3.5–5.0)
Alkaline Phosphatase: 52 U/L (ref 38–126)
Anion gap: 10 (ref 5–15)
BUN: 17 mg/dL (ref 8–23)
CO2: 25 mmol/L (ref 22–32)
Calcium: 9.3 mg/dL (ref 8.9–10.3)
Chloride: 103 mmol/L (ref 98–111)
Creatinine, Ser: 0.77 mg/dL (ref 0.44–1.00)
GFR, Estimated: >60 mL/min (ref >60)
Glucose, Bld: 158 mg/dL — ABNORMAL HIGH (ref 70–99)
Potassium: 3.8 mmol/L (ref 3.5–5.1)
Sodium: 138 mmol/L (ref 135–145)
Total Bilirubin: 0.4 mg/dL (ref <1.2)
Total Protein: 6.4 g/dL — ABNORMAL LOW (ref 6.5–8.1)

## 2023-05-22 LAB — CBC
HCT: 39.2 % (ref 36.0–46.0)
Hemoglobin: 12.4 g/dL (ref 12.0–15.0)
MCH: 26.4 pg (ref 26.0–34.0)
MCHC: 31.6 g/dL (ref 30.0–36.0)
MCV: 83.6 fL (ref 80.0–100.0)
Platelets: 287 10*3/uL (ref 150–400)
RBC: 4.69 MIL/uL (ref 3.87–5.11)
RDW: 13.8 % (ref 11.5–15.5)
WBC: 10.8 10*3/uL — ABNORMAL HIGH (ref 4.0–10.5)
nRBC: 0 % (ref 0.0–0.2)

## 2023-05-22 LAB — ECHOCARDIOGRAM LIMITED
Height: 65 in
Weight: 2687.85 [oz_av]

## 2023-05-22 MED ORDER — MEDROXYPROGESTERONE ACETATE 10 MG PO TABS
10.0000 mg | ORAL_TABLET | Freq: Every day | ORAL | Status: DC
Start: 2023-05-22 — End: 2023-05-26
  Administered 2023-05-22 – 2023-05-26 (×5): 10 mg via ORAL
  Filled 2023-05-22 (×5): qty 1

## 2023-05-22 MED ORDER — SERTRALINE HCL 100 MG PO TABS
100.0000 mg | ORAL_TABLET | Freq: Every day | ORAL | Status: DC
Start: 2023-05-22 — End: 2023-05-26
  Administered 2023-05-22 – 2023-05-25 (×4): 100 mg via ORAL
  Filled 2023-05-22 (×4): qty 1

## 2023-05-22 MED ORDER — SERTRALINE HCL 100 MG PO TABS: 100.0000 mg | ORAL_TABLET | Freq: Every day | ORAL | Status: DC

## 2023-05-22 MED ORDER — QUETIAPINE FUMARATE 25 MG PO TABS
25.0000 mg | ORAL_TABLET | Freq: Every day | ORAL | Status: DC
  Administered 2023-05-22 – 2023-05-23 (×2): 25 mg via ORAL
  Filled 2023-05-22 (×2): qty 1

## 2023-05-22 NOTE — Progress Notes (Signed)
Occupational Therapy Treatment Patient Details Name: Michelle [name] MRN: [id number] DOB: 09-18-1932 Today's Date: 05/22/2023   History of present illness 89 yo female presents to ED on 10/30 with witnessed seizure. PMH includes HTN, seizures, DMII, HLD, CAD, COPD.   OT comments  Patient asleep upon entry and required max stimulation to respond to commands. Patient progressively became more alert and progressed with sit to stands from total assist of 2 to min assist of 2 with stedy . Patient performed grooming seated on EOB with max assist initially and progressed to min assist and performed toilet transfer with Stedy to [facility]. Patient able to stand for toilet hygiene with limited standing tolerance. Acute OT to continue to follow to address bed mobility, transfers and self care.       If plan is discharge home, recommend the following:  A lot of help with walking and/or transfers;A lot of help with bathing/dressing/bathroom;Assistance with cooking/housework;Direct supervision/assist for financial management;Direct supervision/assist for medications management;Assist for transportation;Help with stairs or ramp for entrance   Equipment Recommendations  Hoyer lift    Recommendations for Other Services      Precautions / Restrictions Precautions Precautions: Fall Restrictions Weight Bearing Restrictions: Yes       Mobility Bed Mobility Overal bed mobility: Needs Assistance Bed Mobility: Supine to Sit     Supine to sit: Max assist, +2 for physical assistance, HOB elevated     General bed mobility comments: pt not able to assist due to lethargy    Transfers Overall transfer level: Needs assistance Equipment used: Ambulation equipment used, 2 person hand held assist Transfers: Sit to/from Stand, Bed to chair/wheelchair/BSC Sit to Stand: Min assist, Mod assist, +2 physical assistance, Via lift equipment, Total assist           General transfer comment: totalA of 2 initially  from EOB with HHA of 2 due to lethargy. with continued moblity pt alertness improved, progressed to minA of 2 with use of stedy for sit-stand and then stedy used to complete transfer to [facility] and then to recliner Transfer via Lift Equipment: Stedy   Balance Overall balance assessment: Needs assistance Sitting-balance support: No upper extremity supported, Feet supported Sitting balance-Leahy Scale: Poor Sitting balance - Comments: CGA-min A for sitting balance     Standing balance-Leahy Scale: Poor Standing balance comment: tolerated standing in stedy frame for ~5 seconds at a time                           ADL either performed or assessed with clinical judgement   ADL Overall ADL's : Needs assistance/impaired Eating/Feeding: Set up Eating/Feeding Details (indicate cue type and reason): able to manage cup Grooming: Minimal assistance;Wash/dry face Grooming Details (indicate cue type and reason): initially patient required max assist and with increased arousal was able to perform with min assist                 Toilet Transfer: Minimal assistance;+2 for physical assistance;BSC/3in1 Toilet Transfer Details (indicate cue type and reason): Stedy used to transfer to [facility] with max assist +2 initially but min +2 to stand from [facility] Toileting- Clothing Manipulation and Hygiene: Total assistance Toileting - Clothing Manipulation Details (indicate cue type and reason): standing in stedy            Extremity/Trunk Assessment              Vision       Perception  Praxis      Cognition Arousal: Lethargic Behavior During Therapy: Flat affect Overall Cognitive Status: Impaired/Different from baseline Area of Impairment: Orientation, Memory, Following commands, Safety/judgement, Awareness, Problem solving                 Orientation Level: Disoriented to, Situation   Memory: Decreased short-term memory Following Commands: Follows one step commands with  increased time Safety/Judgement: Decreased awareness of safety, Decreased awareness of deficits Awareness: Intellectual Problem Solving: Slow processing, Decreased initiation, Difficulty sequencing, Requires verbal cues, Requires tactile cues General Comments: pt asleep with difficulty maintaining alertness upon arrival, grunting only, not following commands. with continued stimulation and cues, pt able to maintain eyes open and follow direct cues more consistently. verbalizing by end of session in short, simple phrases        Exercises      Shoulder Instructions       General Comments VSS on RA    Pertinent Vitals/ Pain       Pain Assessment Pain Assessment: No/denies pain Faces Pain Scale: No hurt Pain Intervention(s): Monitored during session  Home Living                                          Prior Functioning/Environment              Frequency  Min 1X/week        Progress Toward Goals  OT Goals(current goals can now be found in the care plan section)  Progress towards OT goals: Progressing toward goals  Acute Rehab OT Goals Patient Stated Goal: none stated OT Goal Formulation: With patient Time For Goal Achievement: 06/02/23 Potential to Achieve Goals: Good ADL Goals Pt Will Perform Grooming: with set-up Pt Will Perform Lower Body Dressing: with mod assist;sit to/from stand Pt Will Transfer to Toilet: with mod assist;stand pivot transfer;bedside commode Additional ADL Goal #1: pt will complete bed mobility with min A as a precursor to ADLs Additional ADL Goal #2: Pt will follow 100% of 1 step commands to demonstrate improved cognition for ADLs  Plan      Co-evaluation    PT/OT/SLP Co-Evaluation/Treatment: Yes Reason for Co-Treatment: Necessary to address cognition/behavior during functional activity;For patient/therapist safety;To address functional/ADL transfers PT goals addressed during session: Mobility/safety with  mobility;Balance;Strengthening/ROM OT goals addressed during session: ADL's and self-care      AM-PAC OT "6 Clicks" Daily Activity     Outcome Measure   Help from another person eating meals?: A Little Help from another person taking care of personal grooming?: A Little Help from another person toileting, which includes using toliet, bedpan, or urinal?: Total Help from another person bathing (including washing, rinsing, drying)?: A Lot Help from another person to put on and taking off regular upper body clothing?: A Lot Help from another person to put on and taking off regular lower body clothing?: Total 6 Click Score: 12    End of Session Equipment Utilized During Treatment: Gait belt;Other (comment) Antony Salmon)  OT Visit Diagnosis: Other abnormalities of gait and mobility (R26.89);Muscle weakness (generalized) (M62.81);Unsteadiness on feet (R26.81);History of falling (Z91.81)   Activity Tolerance Patient tolerated treatment well   Patient Left in chair;with call bell/phone within reach;with chair alarm set;with family/visitor present   Nurse Communication Mobility status;Other (comment) (pinkish/red urine)        Time: 1610-9604 OT Time Calculation (min): 33 min  Charges: OT General  Charges $OT Visit: 1 Visit OT Treatments $Self Care/Home Management : 8-22 mins  Alfonse Flavors, OTA Acute Rehabilitation Services  Office [phone]   Dewain Penning 05/22/2023, 2:39 PM

## 2023-05-22 NOTE — Plan of Care (Signed)
  Problem: Education: Goal: Ability to describe self-care measures that may prevent or decrease complications (Diabetes Survival Skills Education) will improve Outcome: Progressing Goal: Individualized Educational Video(s) Outcome: Progressing   Problem: Coping: Goal: Ability to adjust to condition or change in health will improve Outcome: Progressing   Problem: Fluid Volume: Goal: Ability to maintain a balanced intake and output will improve Outcome: Progressing   Problem: Health Behavior/Discharge Planning: Goal: Ability to identify and utilize available resources and services will improve Outcome: Progressing Goal: Ability to manage health-related needs will improve Outcome: Progressing   Problem: Nutritional: Goal: Maintenance of adequate nutrition will improve Outcome: Progressing Goal: Progress toward achieving an optimal weight will improve Outcome: Progressing   Problem: Skin Integrity: Goal: Risk for impaired skin integrity will decrease Outcome: Progressing   Problem: Nutrition: Goal: Adequate nutrition will be maintained Outcome: Progressing   Problem: Elimination: Goal: Will not experience complications related to bowel motility Outcome: Progressing Goal: Will not experience complications related to urinary retention Outcome: Progressing

## 2023-05-22 NOTE — Telephone Encounter (Signed)
Received faxed document Home Health Certificate (Order ID [id number]), to be filled out by provider. Patient requested to send it back via Fax . Document is located in providers tray at front office.Please advise

## 2023-05-22 NOTE — Progress Notes (Addendum)
Progress Note   Patient: DOSHIE MAGGI ZOX:[id number] DOB: 1932-08-03 DOA: 05/17/2023     4 DOS: the patient was seen and examined on 05/22/2023   Brief hospital course: 89 year old Caucasian female history of seizures, type 2 diabetes, hypertension, chronic diastolic heart failure, wheelchair bound, presents to the ER today with a seizure at home.  Patient lives with her son [name].  Patient does not manage any of her medications.  This is performed by the family.  Son found the patient having a seizure this morning.  Last known normal was last night.  Was still having altered mental status on presentation.  Had a myoclonic seizure in the ER.  was given IV Ativan in the ER.  Patient seen by neurology.  She was loaded with IV Keppra.  She is normally on Lamictal 25 mg twice daily.   Patient is been seen by neurology.  Overnight EEG has been ordered.   [facility] hospitalist consulted for admission.  Assessment and Plan:  Breakthrough seizure (HCC) Admit to progressive neurology bed with telemetry for breakthrough seizures.  Neurology following. Recs to continue EEG. Lamotrigine increased to 50mg  at bedtime and continue 25mg  daily -Neurology recs for intranasal Valtoco 15mg  for clinical seizure lasting more than   Wheelchair dependence Chronic.  Son [name] states that patient walks minimally at home. PT/OT consulted, recs for SNF noted   Chronic diastolic CHF (congestive heart failure) (HCC) Patient remains euvolemic.   COPD (chronic obstructive pulmonary disease) (HCC) Stable.  Use nebulizer treatments while she is still altered.   Essential hypertension Continue home meds as tolerated   Hyperlipidemia associated with type 2 diabetes mellitus (HCC) Resume home statin   Type 2 diabetes mellitus without complication, with long-term current use of insulin (HCC) Continue SSI as needed Glycemic trends stable   Hypomagnesemia Replaced   Diarrhea -Unclear etiology -Continue imodium  as needed  Toxic metabolic encephalopathy -This is likely the etiology for confusion -Already on melatonin. Now improved after trial of 25mg  seroquel.  -Remains drowsy in the AM. Will reschedule seroquel for earlier in the evening  CAD -Known CAD s/p angioplasty in 1990 and stents placed in 2015 -Pt is know to [facility] Cardiology group -this afternoon, pt complained of substernal chest pain with R shoulder pain -Reviewed EKG with Cardiology on call. Appears to have new T wave inversions at V4-V5. Trop has remained stable -Limited 2d echo reviewed, improved EF  Hx Vaginal bleed -noted today -Will continue home methylprednisolone per home regimen    Subjective: Sleeping this AM. Staff reports pt is sleeping much better now  Physical Exam: Vitals:   05/22/23 0532 05/22/23 0829 05/22/23 1113 05/22/23 1515  BP:  (!) 144/60 (!) 140/53 (!) 128/45  Pulse:  65 65 68  Resp:  17 17 18   Temp:  97.7 F (36.5 C) 98.1 F (36.7 C) (!) 97.4 F (36.3 C)  TempSrc:  Oral Oral Oral  SpO2:  100% 96% 98%  Weight: 76.2 kg     Height:       General exam: asleep, in no acute distress Respiratory system: normal chest rise, clear, no audible wheezing Cardiovascular system: regular rhythm, s1-s2 Gastrointestinal system: Nondistended, pos BS Central nervous system: No seizures, no tremors Extremities: No cyanosis, no joint deformities Skin: No rashes, no pallor Psychiatry: difficult to assess given menatation  Data Reviewed:  Labs reviewed: Na 138, K 3.8, Cr 0.77, WBC 10.8, Hgb 12.4, Plts 287  Family Communication: Pt in room, pt's son at bedside  Disposition: Status is: Inpatient Continue inpatient stay because: severity of illness  Planned Discharge Destination: Skilled nursing facility     Author: Rickey Barbara, MD 05/22/2023 5:09 PM  For on call review [email or web address].

## 2023-05-22 NOTE — Telephone Encounter (Signed)
Prior Authorization REQUIRED for [facility] for BILAT knee OA

## 2023-05-22 NOTE — Care Management Important Message (Signed)
Important Message  Patient Details  Name: Michelle [name] MRN: [id number] Date of Birth: April 08, 1933   Important Message Given:  Yes - Medicare IM     Dorena Bodo 05/22/2023, 3:17 PM

## 2023-05-22 NOTE — Telephone Encounter (Signed)
Prior Authorization initiated for [name] via [email or web address] KEY: [id number]

## 2023-05-22 NOTE — Plan of Care (Signed)
  Problem: Coping: Goal: Ability to adjust to condition or change in health will improve Outcome: Progressing   Problem: Health Behavior/Discharge Planning: Goal: Ability to identify and utilize available resources and services will improve Outcome: Progressing   Problem: Health Behavior/Discharge Planning: Goal: Ability to manage health-related needs will improve Outcome: Progressing   Problem: Metabolic: Goal: Ability to maintain appropriate glucose levels will improve Outcome: Progressing

## 2023-05-22 NOTE — Progress Notes (Signed)
Physical Therapy Treatment Patient Details Name: KAILA DEVRIES MRN: [id number] DOB: [birth year] Today's Date: 05/22/2023   History of Present Illness 89 yo female presents to ED on 10/30 with witnessed seizure. PMH includes HTN, seizures, DMII, HLD, CAD, COPD.    PT Comments  The pt was still sleeping soundly when we returned, needed max stimulation and totalA of 2 to transition to sitting EOB before she started to maintain eyes open and respond to cues. The pt did continue to wake up with max multimodal stimulation and cues, progressed from totalA of 2 to attempt sit-stand from EOB to minA of 2 with use of stedy once pt more alert. She required use of stedy to safely complete transfers at this time, but completed x2 with HHA and x2 with stedy with improved performance each time. The pt will continue to benefit from max skilled pt acutely and post-acute rehab to return to prior level of independence. At this time, she continues to need more assist for transfers than son is safely able to provide. Will continue to work towards family goal of return home.     If plan is discharge home, recommend the following: Two people to help with walking and/or transfers;Two people to help with bathing/dressing/bathroom   Can travel by private vehicle     No  Equipment Recommendations  None recommended by PT    Recommendations for Other Services       Precautions / Restrictions Precautions Precautions: Fall Restrictions Weight Bearing Restrictions: Yes     Mobility  Bed Mobility Overal bed mobility: Needs Assistance Bed Mobility: Supine to Sit     Supine to sit: Max assist, +2 for physical assistance, HOB elevated     General bed mobility comments: pt not able to assist due to lethargy    Transfers Overall transfer level: Needs assistance Equipment used: Ambulation equipment used, 2 person hand held assist Transfers: Sit to/from Stand, Bed to chair/wheelchair/BSC Sit to Stand: Min  assist, Mod assist, +2 physical assistance, Via lift equipment, Total assist           General transfer comment: totalA of 2 initially from EOB with HHA of 2 due to lethargy. with continued moblity pt alertness improved, progressed to minA of 2 with use of stedy for sit-stand and then stedy used to complete transfer to [facility] and then to recliner Transfer via Lift Equipment: Stedy  Ambulation/Gait                   Stairs             Wheelchair Mobility     Tilt Bed    Modified Rankin (Stroke Patients Only)       Balance Overall balance assessment: Needs assistance Sitting-balance support: No upper extremity supported, Feet supported Sitting balance-Leahy Scale: Poor Sitting balance - Comments: CGA-min A for sitting balance     Standing balance-Leahy Scale: Poor Standing balance comment: tolerated standing in stedy frame for ~5 seconds at a time                            Cognition Arousal: Lethargic Behavior During Therapy: Flat affect Overall Cognitive Status: Impaired/Different from baseline Area of Impairment: Orientation, Memory, Following commands, Safety/judgement, Awareness, Problem solving                 Orientation Level: Disoriented to, Situation   Memory: Decreased short-term memory Following Commands: Follows one step commands with  increased time Safety/Judgement: Decreased awareness of safety, Decreased awareness of deficits Awareness: Intellectual Problem Solving: Slow processing, Decreased initiation, Difficulty sequencing, Requires verbal cues, Requires tactile cues General Comments: pt asleep with difficulty maintaining alertness upon arrival, grunting only, not following commands. with continued stimulation and cues, pt able to maintain eyes open and follow direct cues more consistently. verbalizing by end of session in short, simple phrases        Exercises General Exercises - Lower Extremity Long Arc Quad: AROM,  Both, 5 reps, Seated    General Comments General comments (skin integrity, edema, etc.): VSS on RA, urine red when pt using purewick, RN alerted      Pertinent Vitals/Pain Pain Assessment Pain Assessment: No/denies pain Faces Pain Scale: No hurt Pain Intervention(s): Monitored during session     PT Goals (current goals can now be found in the care plan section) Acute Rehab PT Goals Patient Stated Goal: home if possible PT Goal Formulation: With patient Time For Goal Achievement: 06/01/23 Potential to Achieve Goals: Fair Progress towards PT goals: Progressing toward goals    Frequency    Min 1X/week           Co-evaluation PT/OT/SLP Co-Evaluation/Treatment: Yes Reason for Co-Treatment: Necessary to address cognition/behavior during functional activity;For patient/therapist safety;To address functional/ADL transfers PT goals addressed during session: Mobility/safety with mobility;Balance;Strengthening/ROM        AM-PAC PT "6 Clicks" Mobility   Outcome Measure  Help needed turning from your back to your side while in a flat bed without using bedrails?: A Little Help needed moving from lying on your back to sitting on the side of a flat bed without using bedrails?: A Lot Help needed moving to and from a bed to a chair (including a wheelchair)?: A Lot Help needed standing up from a chair using your arms (e.g., wheelchair or bedside chair)?: A Lot Help needed to walk in hospital room?: Total Help needed climbing 3-5 steps with a railing? : Total 6 Click Score: 11    End of Session Equipment Utilized During Treatment: Gait belt Activity Tolerance: Patient tolerated treatment well Patient left: in chair;with call bell/phone within reach;with chair alarm set;with family/visitor present Nurse Communication: Mobility status PT Visit Diagnosis: Other abnormalities of gait and mobility (R26.89);Muscle weakness (generalized) (M62.81)     Time: 1256-1330 PT Time  Calculation (min) (ACUTE ONLY): 34 min  Charges:    $Therapeutic Activity: 8-22 mins PT General Charges $$ ACUTE PT VISIT: 1 Visit                     Vickki Muff, PT, DPT   Acute Rehabilitation Department Office [phone] Secure Chat Communication Preferred   Ronnie Derby 05/22/2023, 1:47 PM

## 2023-05-22 NOTE — Progress Notes (Signed)
PT Cancellation Note  Patient Details Name: BELLE CHARLIE MRN: [id number] DOB: Oct 13, 1932   Cancelled Treatment:    Reason Eval/Treat Not Completed: Fatigue/lethargy limiting ability to participate this morning. Son present and states that medicine given at night to help her sleep usually keeps her sleeping until around noon. Will plan to return this afternoon for session.   Vickki Muff, PT, DPT   Acute Rehabilitation Department Office [phone] Secure Chat Communication Preferred   Ronnie Derby 05/22/2023, 10:22 AM

## 2023-05-23 DIAGNOSIS — G40919 Epilepsy, unspecified, intractable, without status epilepticus: Secondary | ICD-10-CM | POA: Diagnosis not present

## 2023-05-23 DIAGNOSIS — G40901 Epilepsy, unspecified, not intractable, with status epilepticus: Secondary | ICD-10-CM | POA: Diagnosis not present

## 2023-05-23 LAB — COMPREHENSIVE METABOLIC PANEL
ALT: 27 U/L (ref 0–44)
AST: 27 U/L (ref 15–41)
Albumin: 2.9 g/dL — ABNORMAL LOW (ref 3.5–5.0)
Alkaline Phosphatase: 48 U/L (ref 38–126)
Anion gap: 8 (ref 5–15)
BUN: 17 mg/dL (ref 8–23)
CO2: 24 mmol/L (ref 22–32)
Calcium: 9.3 mg/dL (ref 8.9–10.3)
Chloride: 104 mmol/L (ref 98–111)
Creatinine, Ser: 0.89 mg/dL (ref 0.44–1.00)
GFR, Estimated: >60 mL/min (ref >60)
Glucose, Bld: 136 mg/dL — ABNORMAL HIGH (ref 70–99)
Potassium: 4.1 mmol/L (ref 3.5–5.1)
Sodium: 136 mmol/L (ref 135–145)
Total Bilirubin: 0.4 mg/dL (ref <1.2)
Total Protein: 6.4 g/dL — ABNORMAL LOW (ref 6.5–8.1)

## 2023-05-23 LAB — CBC
HCT: 37.5 % (ref 36.0–46.0)
Hemoglobin: 11.9 g/dL — ABNORMAL LOW (ref 12.0–15.0)
MCH: 26 pg (ref 26.0–34.0)
MCHC: 31.7 g/dL (ref 30.0–36.0)
MCV: 81.9 fL (ref 80.0–100.0)
Platelets: 263 10*3/uL (ref 150–400)
RBC: 4.58 MIL/uL (ref 3.87–5.11)
RDW: 14 % (ref 11.5–15.5)
WBC: 7.4 10*3/uL (ref 4.0–10.5)
nRBC: 0 % (ref 0.0–0.2)

## 2023-05-23 LAB — GLUCOSE, CAPILLARY
Glucose-Capillary: 125 mg/dL — ABNORMAL HIGH (ref 70–99)
Glucose-Capillary: 124 mg/dL — ABNORMAL HIGH (ref 70–99)
Glucose-Capillary: 152 mg/dL — ABNORMAL HIGH (ref 70–99)
Glucose-Capillary: 210 mg/dL — ABNORMAL HIGH (ref 70–99)
Glucose-Capillary: 114 mg/dL — ABNORMAL HIGH (ref 70–99)

## 2023-05-23 NOTE — Progress Notes (Signed)
Physical Therapy Treatment Patient Details Name: Michelle [name] MRN: [id number] DOB: [birth year] Today's Date: 05/23/2023   History of Present Illness 89 yo female presents to ED on 10/30 with witnessed seizure. PMH includes HTN, seizures, DMII, HLD, CAD, COPD.    PT Comments  Pt awake and alert on arrival, however needing increased time to respond to all questions and simple single step commands. Pt with noted confusion throughout session, disoriented to place and needing max cues for safety with all mobility. Pt requiring min A to complete bed mobility, min A to come to stand with RW support with dense cues for posture and forward gaze. Pt able to step pivot to chair with mod A with pt demonstrating poor safety awareness, releasing grasp on RW and leaving RW behind, needing assist to guide hips for safe sitting. Pt agreeable to time up in chair at end of session. Current plan remains appropriate to address deficits and maximize functional independence and decrease caregiver burden. Pt continues to benefit from skilled PT services to progress toward functional mobility goals.     If plan is discharge home, recommend the following: Two people to help with walking and/or transfers;Two people to help with bathing/dressing/bathroom   Can travel by private vehicle     No  Equipment Recommendations  None recommended by PT    Recommendations for Other Services       Precautions / Restrictions Precautions Precautions: Fall Restrictions Weight Bearing Restrictions: Yes     Mobility  Bed Mobility Overal bed mobility: Needs Assistance Bed Mobility: Supine to Sit     Supine to sit: Min assist, HOB elevated, Used rails     General bed mobility comments: min A to bring LEs to and off EOB and to elevate trunk, cues for sequencing with pt demonstrating slow initiation    Transfers Overall transfer level: Needs assistance Equipment used: Rolling walker (2 wheels) Transfers: Sit to/from  Stand, Bed to chair/wheelchair/BSC Sit to Stand: Min assist, From elevated surface   Step pivot transfers: Mod assist       General transfer comment: min A to power up x2 from slight elevate EOB, max cues to tuck hips with tactil cues to acheive and to come to full upright standing. mod A to step pivot to chair with pt needing max cues for hand placement as pt releasing RW a    Ambulation/Gait               General Gait Details: unable   Stairs             Wheelchair Mobility     Tilt Bed    Modified Rankin (Stroke Patients Only)       Balance Overall balance assessment: Needs assistance Sitting-balance support: No upper extremity supported, Feet supported Sitting balance-Leahy Scale: Poor Sitting balance - Comments: supervision for safety Postural control: Right lateral lean   Standing balance-Leahy Scale: Poor Standing balance comment: max verbal/tactile cues for upright posture, able to maintain ~30 seconds                            Cognition Arousal: Alert Behavior During Therapy: Flat affect Overall Cognitive Status: Impaired/Different from baseline Area of Impairment: Orientation, Memory, Following commands, Safety/judgement, Awareness, Problem solving                 Orientation Level: Disoriented to, Situation, Place, Time   Memory: Decreased short-term memory Following Commands: Follows one step  commands with increased time Safety/Judgement: Decreased awareness of safety, Decreased awareness of deficits Awareness: Intellectual Problem Solving: Slow processing, Decreased initiation, Difficulty sequencing, Requires verbal cues, Requires tactile cues General Comments: pt awake and alert on arrival, increased time to respond to questions and follow commands, disrotiented to place, pt stating we were in her dining room        Exercises General Exercises - Lower Extremity Long Arc Quad: AROM, Both, 10 reps, Seated Hip  Flexion/Marching: AROM, Both, 20 reps, Seated    General Comments General comments (skin integrity, edema, etc.): VSS on RA, son at bedside and supportive      Pertinent Vitals/Pain Pain Assessment Pain Assessment: Faces Faces Pain Scale: Hurts a little bit Pain Location: headache Pain Descriptors / Indicators: Headache Pain Intervention(s): Monitored during session, Limited activity within patient's tolerance    Home Living                          Prior Function            PT Goals (current goals can now be found in the care plan section) Acute Rehab PT Goals Patient Stated Goal: home if possible PT Goal Formulation: With patient Time For Goal Achievement: 06/01/23 Progress towards PT goals: Progressing toward goals    Frequency    Min 1X/week      PT Plan      Co-evaluation              AM-PAC PT "6 Clicks" Mobility   Outcome Measure  Help needed turning from your back to your side while in a flat bed without using bedrails?: A Little Help needed moving from lying on your back to sitting on the side of a flat bed without using bedrails?: A Lot Help needed moving to and from a bed to a chair (including a wheelchair)?: A Lot Help needed standing up from a chair using your arms (e.g., wheelchair or bedside chair)?: A Lot Help needed to walk in hospital room?: Total Help needed climbing 3-5 steps with a railing? : Total 6 Click Score: 11    End of Session Equipment Utilized During Treatment: Gait belt Activity Tolerance: Patient tolerated treatment well Patient left: in chair;with call bell/phone within reach;with chair alarm set;with family/visitor present Nurse Communication: Mobility status PT Visit Diagnosis: Other abnormalities of gait and mobility (R26.89);Muscle weakness (generalized) (M62.81)     Time: 9147-8295 PT Time Calculation (min) (ACUTE ONLY): 28 min  Charges:    $Therapeutic Activity: 23-37 mins PT General Charges $$  ACUTE PT VISIT: 1 Visit                     Bettyjane Shenoy R. PTA Acute Rehabilitation Services Office: [phone]   Catalina Antigua 05/23/2023, 3:21 PM

## 2023-05-23 NOTE — Plan of Care (Signed)
  Problem: Metabolic: Goal: Ability to maintain appropriate glucose levels will improve Outcome: Progressing   Problem: Nutritional: Goal: Maintenance of adequate nutrition will improve Outcome: Progressing   Problem: Nutritional: Goal: Progress toward achieving an optimal weight will improve Outcome: Progressing   Problem: Skin Integrity: Goal: Risk for impaired skin integrity will decrease Outcome: Progressing   Problem: Tissue Perfusion: Goal: Adequacy of tissue perfusion will improve Outcome: Progressing   Problem: Education: Goal: Knowledge of General Education information will improve Description: Including pain rating scale, medication(s)/side effects and non-pharmacologic comfort measures Outcome: Progressing   Problem: Nutrition: Goal: Adequate nutrition will be maintained Outcome: Progressing

## 2023-05-23 NOTE — Plan of Care (Signed)

## 2023-05-23 NOTE — Plan of Care (Signed)
  Problem: Education: Goal: Ability to describe self-care measures that may prevent or decrease complications (Diabetes Survival Skills Education) will improve Outcome: Progressing   Problem: Coping: Goal: Ability to adjust to condition or change in health will improve Outcome: Progressing   Problem: Metabolic: Goal: Ability to maintain appropriate glucose levels will improve Outcome: Progressing   Problem: Nutritional: Goal: Maintenance of adequate nutrition will improve Outcome: Progressing   

## 2023-05-23 NOTE — Progress Notes (Signed)
Progress Note   Patient: Michelle [name] DOB: [birth year] DOA: 05/17/2023     5 DOS: the patient was seen and examined on 05/23/2023   Brief hospital course: 89 year old Caucasian female history of seizures, type 2 diabetes, hypertension, chronic diastolic heart failure, wheelchair bound, presents to the ER today with a seizure at home.  Patient lives with her son [name].  Patient does not manage any of her medications.  This is performed by the family.  Son found the patient having a seizure this morning.  Last known normal was last night.  Was still having altered mental status on presentation.  Had a myoclonic seizure in the ER.  was given IV Ativan in the ER.  Patient seen by neurology.  She was loaded with IV Keppra.  She is normally on Lamictal 25 mg twice daily.   Patient is been seen by neurology.  Overnight EEG has been ordered.   [facility] hospitalist consulted for admission.  Assessment and Plan:  Breakthrough seizure (HCC) Admit to progressive neurology bed with telemetry for breakthrough seizures.  Neurology following. Recs to continue EEG. Lamotrigine increased to 50mg  at bedtime and continue 25mg  daily -Neurology recs for intranasal Valtoco 15mg  for clinical seizure lasting more than   Wheelchair dependence Chronic.  Son [name] states that patient walks minimally at home. PT/OT consulted, recs for SNF noted   Chronic diastolic CHF (congestive heart failure) (HCC) Patient remains euvolemic.   COPD (chronic obstructive pulmonary disease) (HCC) Stable.  Use nebulizer treatments while she is still altered.   Essential hypertension Continue home meds as tolerated   Hyperlipidemia associated with type 2 diabetes mellitus (HCC) Resume home statin   Type 2 diabetes mellitus without complication, with long-term current use of insulin (HCC) Continue SSI as needed Glycemic trends stable   Hypomagnesemia Replaced   Diarrhea -Unclear etiology -Continue imodium  as needed  Toxic metabolic encephalopathy likely secondary to insomnia -now improved -On melatonin PTA without much improvement. Now improved after trial of 25mg  seroquel.   CAD -Known CAD s/p angioplasty in 1990 and stents placed in 2015 -Pt is know to [facility] -this afternoon, pt complained of substernal chest pain with R shoulder pain -Reviewed EKG with Cardiology on call. Appears to have new T wave inversions at V4-V5. Trop has remained stable -Limited 2d echo reviewed, improved EF -remains chest pain free today, monitor  Hx Vaginal bleed likely secondary to known endometrial mass followed by Gyn Onc -Recurrent bleed noted this admit. Home methylprednisolone has been resumed as of 11/4 -Chart reviewed. Pt is know to Gyn onc service for endometrial mass, last seen in 2023. At that time, pt was recommended surgical intervention and endometrial biopsy, however pt and family decided against any surgery. That decision holds today, confirmed with pt and family -Oral progesterone was recommended by Gyn to prevent future episodes of uterine bleed -Hgb remains stable. Cont methylprednisolone for now. If bleed does not improve, can consider d/w Gyn, however family has made clear her wishes against surgery -recheck CBC in AM     Subjective: Reports feeling better today  Physical Exam: Vitals:   05/23/23 0343 05/23/23 0733 05/23/23 1133 05/23/23 1622  BP: 126/65 (!) 152/63 (!) 148/51 (!) 116/55  Pulse: 68 76 75 72  Resp: 19 18 18 17   Temp: 98 F (36.7 C) 98.4 F (36.9 C) 97.7 F (36.5 C) 98.5 F (36.9 C)  TempSrc: Oral Oral Oral Oral  SpO2: 98% 97% 100% 98%  Weight:  Height:       General exam: Awake, sitting in chair, in nad Respiratory system: Normal respiratory effort, no wheezing Cardiovascular system: regular rate, s1, s2 Gastrointestinal system: Soft, nondistended, positive BS Central nervous system: CN2-12 grossly intact, strength intact Extremities:  Perfused, no clubbing Skin: Normal skin turgor, no notable skin lesions seen Psychiatry: Mood normal // no visual hallucinations   Data Reviewed:  Labs reviewed: Na 136, K 4.1, Cr 0.89, WBC 7.4, Hgb 11.9, Plts 263  Family Communication: Pt in room, pt's son at bedside  Disposition: Status is: Inpatient Continue inpatient stay because: severity of illness  Planned Discharge Destination: Skilled nursing facility     Author: Rickey Barbara, MD 05/23/2023 4:39 PM  For on call review [email or web address].

## 2023-05-23 NOTE — Telephone Encounter (Signed)
Forms completed and faxed back.

## 2023-05-24 ENCOUNTER — Other Ambulatory Visit (HOSPITAL_COMMUNITY): Payer: Self-pay

## 2023-05-24 DIAGNOSIS — G40919 Epilepsy, unspecified, intractable, without status epilepticus: Secondary | ICD-10-CM | POA: Diagnosis not present

## 2023-05-24 LAB — GLUCOSE, CAPILLARY
Glucose-Capillary: 133 mg/dL — ABNORMAL HIGH (ref 70–99)
Glucose-Capillary: 119 mg/dL — ABNORMAL HIGH (ref 70–99)
Glucose-Capillary: 127 mg/dL — ABNORMAL HIGH (ref 70–99)
Glucose-Capillary: 146 mg/dL — ABNORMAL HIGH (ref 70–99)
Glucose-Capillary: 158 mg/dL — ABNORMAL HIGH (ref 70–99)

## 2023-05-24 LAB — COMPREHENSIVE METABOLIC PANEL
ALT: 34 U/L (ref 0–44)
AST: 36 U/L (ref 15–41)
Albumin: 2.8 g/dL — ABNORMAL LOW (ref 3.5–5.0)
Alkaline Phosphatase: 54 U/L (ref 38–126)
Anion gap: 8 (ref 5–15)
BUN: 17 mg/dL (ref 8–23)
CO2: 22 mmol/L (ref 22–32)
Calcium: 9.1 mg/dL (ref 8.9–10.3)
Chloride: 107 mmol/L (ref 98–111)
Creatinine, Ser: 0.71 mg/dL (ref 0.44–1.00)
GFR, Estimated: >60 mL/min (ref >60)
Glucose, Bld: 117 mg/dL — ABNORMAL HIGH (ref 70–99)
Potassium: 3.8 mmol/L (ref 3.5–5.1)
Sodium: 137 mmol/L (ref 135–145)
Total Bilirubin: 0.3 mg/dL (ref <1.2)
Total Protein: 6.3 g/dL — ABNORMAL LOW (ref 6.5–8.1)

## 2023-05-24 LAB — CBC
HCT: 37.7 % (ref 36.0–46.0)
Hemoglobin: 12 g/dL (ref 12.0–15.0)
MCH: 26.1 pg (ref 26.0–34.0)
MCHC: 31.8 g/dL (ref 30.0–36.0)
MCV: 82 fL (ref 80.0–100.0)
Platelets: 261 10*3/uL (ref 150–400)
RBC: 4.6 MIL/uL (ref 3.87–5.11)
RDW: 13.9 % (ref 11.5–15.5)
WBC: 7.1 10*3/uL (ref 4.0–10.5)
nRBC: 0 % (ref 0.0–0.2)

## 2023-05-24 MED ORDER — QUETIAPINE FUMARATE 25 MG PO TABS
25.0000 mg | ORAL_TABLET | ORAL | Status: DC
  Administered 2023-05-24 – 2023-05-25 (×2): 25 mg via ORAL
  Filled 2023-05-24 (×2): qty 1

## 2023-05-24 NOTE — Plan of Care (Signed)
  Problem: Education: Goal: Ability to describe self-care measures that may prevent or decrease complications (Diabetes Survival Skills Education) will improve Outcome: Progressing Goal: Individualized Educational Video(s) Outcome: Progressing   Problem: Coping: Goal: Ability to adjust to condition or change in health will improve Outcome: Progressing   Problem: Fluid Volume: Goal: Ability to maintain a balanced intake and output will improve Outcome: Progressing   Problem: Health Behavior/Discharge Planning: Goal: Ability to identify and utilize available resources and services will improve Outcome: Progressing Goal: Ability to manage health-related needs will improve Outcome: Progressing   Problem: Metabolic: Goal: Ability to maintain appropriate glucose levels will improve Outcome: Progressing   Problem: Nutritional: Goal: Maintenance of adequate nutrition will improve Outcome: Progressing Goal: Progress toward achieving an optimal weight will improve Outcome: Progressing   Problem: Skin Integrity: Goal: Risk for impaired skin integrity will decrease Outcome: Progressing   Problem: Tissue Perfusion: Goal: Adequacy of tissue perfusion will improve Outcome: Progressing   Problem: Education: Goal: Knowledge of General Education information will improve Description: Including pain rating scale, medication(s)/side effects and non-pharmacologic comfort measures Outcome: Progressing   Problem: Nutrition: Goal: Adequate nutrition will be maintained Outcome: Progressing   Problem: Elimination: Goal: Will not experience complications related to bowel motility Outcome: Progressing Goal: Will not experience complications related to urinary retention Outcome: Progressing   Problem: Safety: Goal: Ability to remain free from injury will improve Outcome: Progressing   Problem: Skin Integrity: Goal: Risk for impaired skin integrity will decrease Outcome: Progressing

## 2023-05-24 NOTE — Telephone Encounter (Signed)
BUY AND BILL  [facility] for BILAT knee OA  OK to scheduled on or after 06/01/23  Primary Insurance: [employer] Medicare Co-pay:  $15 Co-insurance: undisclosed Deductible: does not apply Prior Auth: APPROVED PA# [id number] Valid: 06/01/23-11/29/23     Knee Injection History 04/14/22 - BILAT [facility] 08/01/22 - BILAT [facility] 08/16/22 - BILAT [facility] 11/14/22 - BILAT [facility] 03/08/23 - [facility] BILAT

## 2023-05-24 NOTE — Telephone Encounter (Signed)
APPROVED PA# [id number] Valid: 06/01/23-11/29/23

## 2023-05-24 NOTE — TOC Progression Note (Signed)
Transition of Care [facility]) - Progression Note    Patient Details  Name: Michelle [name] MRN: [id number] Date of Birth: Mar 02, 1933  Transition of Care [facility]) CM/SW Contact  Baldemar Lenis, [place] Phone Number: 05/24/2023, 4:21 PM  Clinical Narrative:   CSW met with son, [name], at bedside to discuss continued recommendation for SNF. [name] said he had recalculated looking at his calendar, and the patient should have had exactly 60 days between her discharge from [facility] and admission to the hospital, so he believes her days should have reset. CSW contacted Phineas Semen to verify, but the business office manager would have to verify that information and she has left for the day. CSW to call back tomorrow to discuss possibility of patient owing copays with [facility] tomorrow. CSW answered questions with son and discussed other concerns about patient's medications. CSW passed concern onto RN. CSW to follow.    Expected Discharge Plan: Skilled Nursing Facility Barriers to Discharge: Continued Medical Work up  Expected Discharge Plan and Services   Discharge Planning Services: CM Consult   Living arrangements for the past 2 months: Single Family Home                                       Social Determinants of Health (SDOH) Interventions SDOH Screenings   Food Insecurity: No Food Insecurity (05/17/2023)  Housing: Patient Declined (05/17/2023)  Transportation Needs: No Transportation Needs (05/17/2023)  Utilities: Not At Risk (05/17/2023)  Depression (PHQ2-9): Medium Risk (08/19/2022)  Financial Resource Strain: Low Risk  (06/20/2022)  Physical Activity: Inactive (06/20/2022)  Social Connections: Socially Isolated (06/20/2022)  Stress: No Stress Concern Present (06/20/2022)  Tobacco Use: Medium Risk (05/17/2023)    Readmission Risk Interventions    09/01/2020   12:30 PM  Readmission Risk Prevention Plan  Transportation Screening Complete  PCP or Specialist Appt within 3-5  Days Complete  HRI or Home Care Consult Complete  Social Work Consult for Recovery Care Planning/Counseling Complete  Palliative Care Screening Not Applicable  Medication Review Oceanographer) Complete

## 2023-05-24 NOTE — Progress Notes (Signed)
PROGRESS NOTE    Michelle [name]  ZOX:[id number] DOB: [birth year] DOA: 05/17/2023 PCP: Shelva Majestic, MD   Brief Narrative: Michelle [name] is a 89 y.o. female with a history of diabetes mellitus, seizures, hypertension, diastolic heart failure, wheelchair bound.  Patient presented secondary to seizure activity. Neurology consulted. Lamotrigine dose adjusted.   Assessment and Plan:  Breakthrough seizure Neurology consulted. Lamotrigine increased. EEG ordered this admission and were significant for no evidence of seizure or definite epileptiform discharges. -Continue lamotrigine  -Versed PRN seizures  Chronic diastolic heart failure Stable.  COPD Stable. -Albuterol as needed  Primary hypertension -Continue Coreg, amlodipine and enalapril  Diabetes mellitus type 2 Well controlled based on prior hemoglobin A1C from 12/2022 of 5.5%. -Continue SSI  Hyperlipidemia -Continue Crestor  Hypomagnesemia Resolved with magnesium repletion.  Diarrhea Improved.  Toxic/metabolic encephalopathy Noted in history. Unclear about offending medication. Patient started on Seroquel to help with insomnia related delirium which has helped.  CAD Noted. History of PCI. Stable. Episode of chest pain this admission which resolved. Transthoracic Echocardiogram obtained without worsening cardiomyopathy. Troponin negative. -Continue Aspirin, Plavix  Vaginal bleeding Secondary to known endometrial mass. Patient is followed by gynecology oncology. Bleeding improved with resumption of medroxyprogesterone. Hemoglobin has remained stable. -Continue medroxyprogesterone   DVT prophylaxis: SCDs Code Status:   Code Status: Limited: Do not attempt resuscitation (DNR) -DNR-LIMITED -Do Not Intubate/DNI  Family Communication: Son via telephone Disposition Plan: Discharge to SNF pending bed availability   Consultants:  Neurology  Procedures:    Antimicrobials:     Subjective: Patient  reports no concerns this morning.  Objective: BP (!) 157/73 (BP Location: Left Arm)   Pulse 73   Temp (!) 97.5 F (36.4 C) (Oral)   Resp 17   Ht 5\' 5"  (1.651 m)   Wt 74.4 kg   SpO2 99%   BMI 27.29 kg/m   Examination:  General exam: Appears calm and comfortable Respiratory system: Clear to auscultation. Respiratory effort normal. Cardiovascular system: S1 & S2 heard, RRR. No murmurs. Gastrointestinal system: Abdomen is nondistended, soft and nontender. Central nervous system: Drowsy but eventually arouses and is alert and oriented.   Data Reviewed: I have personally reviewed following labs and imaging studies  CBC Lab Results  Component Value Date   WBC 7.1 05/24/2023   RBC 4.60 05/24/2023   HGB 12.0 05/24/2023   HCT 37.7 05/24/2023   MCV 82.0 05/24/2023   MCH 26.1 05/24/2023   PLT 261 05/24/2023   MCHC 31.8 05/24/2023   RDW 13.9 05/24/2023   LYMPHSABS 2.1 05/18/2023   MONOABS 0.6 05/18/2023   EOSABS 0.2 05/18/2023   BASOSABS 0.1 05/18/2023     Last metabolic panel Lab Results  Component Value Date   NA 137 05/24/2023   K 3.8 05/24/2023   CL 107 05/24/2023   CO2 22 05/24/2023   BUN 17 05/24/2023   CREATININE 0.71 05/24/2023   GLUCOSE 117 (H) 05/24/2023   GFRNONAA >60 05/24/2023   GFRAA 77 07/01/2020   CALCIUM 9.1 05/24/2023   PHOS 2.1 (L) 02/07/2023   PROT 6.3 (L) 05/24/2023   ALBUMIN 2.8 (L) 05/24/2023   BILITOT 0.3 05/24/2023   ALKPHOS 54 05/24/2023   AST 36 05/24/2023   ALT 34 05/24/2023   ANIONGAP 8 05/24/2023    GFR: Estimated Creatinine Clearance: 49.1 mL/min (by C-G formula based on SCr of 0.71 mg/dL).  Recent Results (from the past 240 hour(s))  Resp panel by RT-PCR (RSV, Flu A&B, Covid) Anterior Nasal Swab  Status: None   Collection Time: 05/17/23  9:29 AM   Specimen: Anterior Nasal Swab  Result Value Ref Range Status   SARS Coronavirus 2 by RT PCR NEGATIVE NEGATIVE Final   Influenza A by PCR NEGATIVE NEGATIVE Final   Influenza B  by PCR NEGATIVE NEGATIVE Final    Comment: (NOTE) The Xpert Xpress SARS-CoV-2/FLU/RSV plus assay is intended as an aid in the diagnosis of influenza from Nasopharyngeal swab specimens and should not be used as a sole basis for treatment. Nasal washings and aspirates are unacceptable for Xpert Xpress SARS-CoV-2/FLU/RSV testing.  Fact Sheet for Patients: BloggerCourse.com  Fact Sheet for Healthcare Providers: SeriousBroker.it  This test is not yet approved or cleared by the [place] FDA and has been authorized for detection and/or diagnosis of SARS-CoV-2 by FDA under an Emergency Use Authorization (EUA). This EUA will remain in effect (meaning this test can be used) for the duration of the COVID-19 declaration under Section 564(b)(1) of the Act, 21 U.S.C. section 360bbb-3(b)(1), unless the authorization is terminated or revoked.     Resp Syncytial Virus by PCR NEGATIVE NEGATIVE Final    Comment: (NOTE) Fact Sheet for Patients: BloggerCourse.com  Fact Sheet for Healthcare Providers: SeriousBroker.it  This test is not yet approved or cleared by the [place] FDA and has been authorized for detection and/or diagnosis of SARS-CoV-2 by FDA under an Emergency Use Authorization (EUA). This EUA will remain in effect (meaning this test can be used) for the duration of the COVID-19 declaration under Section 564(b)(1) of the Act, 21 U.S.C. section 360bbb-3(b)(1), unless the authorization is terminated or revoked.  Performed at [facility], [address] [place], [place] [address]   Culture, Urine (Do not remove urinary catheter, catheter placed by urology or difficult to place)     Status: Abnormal   Collection Time: 05/17/23  2:28 PM   Specimen: In/Out Cath Urine  Result Value Ref Range Status   Specimen Description IN/OUT CATH URINE  Final   Special Requests   Final     NONE Performed at [facility], [address] [place], [place] [address]    Culture (A)  Final    50,000 COLONIES/mL MULTIPLE SPECIES PRESENT, SUGGEST RECOLLECTION   Report Status 05/19/2023 FINAL  Final      Radiology Studies: No results found.    LOS: 6 days    Jacquelin Hawking, MD [facility] 05/24/2023, 6:14 PM   If 7PM-7AM, please contact night-coverage [email or web address]

## 2023-05-24 NOTE — Progress Notes (Signed)
Patient's sons request that night time seroquel dose be given no later than 7pm to prevent daytime drowsiness.

## 2023-05-24 NOTE — NC FL2 (Addendum)
[place] MEDICAID FL2 LEVEL OF CARE FORM     IDENTIFICATION  Patient Name: Michelle [name] Birthdate: 10/10/1932 Sex: female Admission Date (Current Location): 05/17/2023  [facility] and [place] Number:  Producer, television/film/video and Address:  [facility] [place], [address], [place], [place] [address]      Provider Number: [id number]  Attending Physician Name and Address:  Narda Bonds, MD  Relative Name and Phone Number:       Current Level of Care: Hospital Recommended Level of Care: Skilled Nursing Facility Prior Approval Number:    Date Approved/Denied:   PASRR Number: [id number]  Discharge Plan: SNF    Current Diagnoses: Patient Active Problem List   Diagnosis Date Noted   Breakthrough seizure (HCC) 05/17/2023   Wheelchair dependence 05/17/2023   DNR (do not resuscitate)/DNI(Do Not Intubate) 05/17/2023   Hypomagnesemia 05/17/2023   Seizure (HCC) 02/06/2023   Memory impairment 09/09/2022   Chronic pain of both knees 04/05/2022   Epilepsy (HCC) 03/12/2021   Adnexal mass 11/24/2020   Thickened endometrium 11/24/2020   Dementia (HCC) 10/22/2020   Actinic keratosis 11/17/2017   Anemia, iron deficiency 10/23/2015   CKD (chronic kidney disease), stage III (HCC) 07/25/2014   Osteoarthritis, knee 05/13/2014   Anxiety state 04/21/2014   Chronic diastolic CHF (congestive heart failure) (HCC) 03/13/2014   SOB (shortness of breath) 03/05/2014   CAD- RCA PCI '90s, STEMI-RCA DES 02/18/14 03/05/2014   Back pain, lumbosacral 10/10/2012   Type 2 diabetes mellitus without complication, with long-term current use of insulin (HCC) 03/26/2007   Hyperlipidemia associated with type 2 diabetes mellitus (HCC) 03/26/2007   Essential hypertension 04/21/2006   COPD (chronic obstructive pulmonary disease) (HCC) 04/21/2006    Orientation RESPIRATION BLADDER Height & Weight     Self, Time, Place  Normal Incontinent Weight: 164 lb 0.4 oz (74.4 kg) Height:  5\' 5"   (165.1 cm)  BEHAVIORAL SYMPTOMS/MOOD NEUROLOGICAL BOWEL NUTRITION STATUS      Continent Diet (Heart Healthy)  AMBULATORY STATUS COMMUNICATION OF NEEDS Skin   Extensive Assist Verbally Normal                       Personal Care Assistance Level of Assistance  Feeding, Dressing, Bathing Bathing Assistance: Maximum assistance Feeding assistance: Limited assistance Dressing Assistance: Maximum assistance     Functional Limitations Info             SPECIAL CARE FACTORS FREQUENCY  PT (By licensed PT), OT (By licensed OT)     PT Frequency: 5x/week OT Frequency: 5x/week            Contractures      Additional Factors Info  Code Status, Allergies Code Status Info: DNR Allergies Info: Codeine Sulfate, Morphine Sulfate           Current Medications (05/24/2023):  This is the current hospital active medication list Current Facility-Administered Medications  Medication Dose Route Frequency Provider Last Rate Last Admin   acetaminophen (TYLENOL) tablet 650 mg  650 mg Oral Q6H PRN Jerald Kief, MD   650 mg at 05/21/23 1646   albuterol (PROVENTIL) (2.5 MG/3ML) 0.083% nebulizer solution 2.5 mg  2.5 mg Nebulization Q6H PRN Jerald Kief, MD       amLODipine (NORVASC) tablet 2.5 mg  2.5 mg Oral Daily Jerald Kief, MD   2.5 mg at 05/24/23 1103   aspirin chewable tablet 81 mg  81 mg Oral Daily Jerald Kief, MD  81 mg at 05/24/23 1101   calcium carbonate (TUMS - dosed in mg elemental calcium) chewable tablet 200 mg of elemental calcium  1 tablet Oral TID PRN Jerald Kief, MD   200 mg of elemental calcium at 05/21/23 1441   carvedilol (COREG) tablet 12.5 mg  12.5 mg Oral BID Jerald Kief, MD   12.5 mg at 05/24/23 1106   clopidogrel (PLAVIX) tablet 75 mg  75 mg Oral Daily Jerald Kief, MD   75 mg at 05/24/23 1104   enalapril (VASOTEC) tablet 20 mg  20 mg Oral QHS Jerald Kief, MD   20 mg at 05/23/23 2142   insulin aspart (novoLOG) injection 0-15 Units  0-15  Units Subcutaneous TID WC Carollee Herter, DO   2 Units at 05/24/23 0745   insulin aspart (novoLOG) injection 0-5 Units  0-5 Units Subcutaneous QHS Carollee Herter, DO       labetalol (NORMODYNE) injection 10 mg  10 mg Intravenous Q4H PRN Carollee Herter, DO   10 mg at 05/22/23 0401   lamoTRIgine (LAMICTAL) tablet 25 mg  25 mg Oral Daily Carollee Herter, DO   25 mg at 05/24/23 1105   lamoTRIgine (LAMICTAL) tablet 50 mg  50 mg Oral QHS Carollee Herter, DO   50 mg at 05/23/23 2141   loperamide (IMODIUM) capsule 2 mg  2 mg Oral PRN Jerald Kief, MD   2 mg at 05/20/23 1246   medroxyPROGESTERone (PROVERA) tablet 10 mg  10 mg Oral Daily Jerald Kief, MD   10 mg at 05/24/23 1106   midazolam (VERSED) injection 1 mg  1 mg Intravenous Q4H PRN Carollee Herter, DO       nitroGLYCERIN (NITROSTAT) SL tablet 0.4 mg  0.4 mg Sublingual Q5 min PRN Jerald Kief, MD   0.4 mg at 05/21/23 1657   ondansetron (ZOFRAN) injection 4 mg  4 mg Intravenous Q6H PRN Carollee Herter, DO       pantoprazole (PROTONIX) EC tablet 40 mg  40 mg Oral Daily Jerald Kief, MD   40 mg at 05/24/23 1106   QUEtiapine (SEROQUEL) tablet 25 mg  25 mg Oral QHS Jerald Kief, MD   25 mg at 05/23/23 2141   rosuvastatin (CRESTOR) tablet 40 mg  40 mg Oral Daily Jerald Kief, MD   40 mg at 05/24/23 1105   sertraline (ZOLOFT) tablet 100 mg  100 mg Oral QHS Jerald Kief, MD   100 mg at 05/23/23 2141     Discharge Medications: Please see discharge summary for a list of discharge medications.  Relevant Imaging Results:  Relevant Lab Results:   Additional Information SS#: [id number]  Antion [name], Student-Social Work

## 2023-05-25 DIAGNOSIS — G40919 Epilepsy, unspecified, intractable, without status epilepticus: Secondary | ICD-10-CM | POA: Diagnosis not present

## 2023-05-25 LAB — GLUCOSE, CAPILLARY
Glucose-Capillary: 124 mg/dL — ABNORMAL HIGH (ref 70–99)
Glucose-Capillary: 111 mg/dL — ABNORMAL HIGH (ref 70–99)
Glucose-Capillary: 186 mg/dL — ABNORMAL HIGH (ref 70–99)
Glucose-Capillary: 119 mg/dL — ABNORMAL HIGH (ref 70–99)
Glucose-Capillary: 131 mg/dL — ABNORMAL HIGH (ref 70–99)

## 2023-05-25 MED ORDER — INFLUENZA VAC A&B SURF ANT ADJ 0.5 ML IM SUSY
0.5000 mL | PREFILLED_SYRINGE | Freq: Once | INTRAMUSCULAR | Status: AC
  Administered 2023-05-26: 0.5 mL via INTRAMUSCULAR
  Filled 2023-05-25: qty 0.5

## 2023-05-25 NOTE — TOC Progression Note (Signed)
Transition of Care [facility]) - Progression Note    Patient Details  Name: Michelle [name] MRN: [id number] Date of Birth: 08/31/1932  [facility]) CM/SW Contact  Baldemar Len[place], [place] Phone Number: 05/25/2023, 3:59 PM  Clinical Narrative:   CSW spoke with [name] business office manager to discuss patient's wellness period. It appears that patient has exactly met her 60 days of wellness. [name] will not require the patient's family to provide copayments up front, but discussed that there's no guarantee that there won't be copayments until the billing fully completes. CSW met with patient's son, [name], at bedside to discuss and confirm that the patient hasn't had any other ER visits or hospital admissions since SNF discharge. Patient and family aware of slight possibility that Medicare could still bill copays, willing to proceed with SNF placement. [facility] patient. CSW requested CMA to initiate insurance authorization, authorization is approved. [facility] can admit tomorrow. CSW to follow.    Expected Discharge Plan: Skilled Nursing Facility Barriers to Discharge: Continued Medical Work up  Expected Discharge Plan and Services   Discharge Planning Services: CM Consult   Living arrangements for the past 2 months: Single Family Home                                       Social Determinants of Health (SDOH) Interventions SDOH Screenings   Food Insecurity: No Food Insecurity (05/17/2023)  Housing: Patient Declined (05/17/2023)  Transportation Needs: No Transportation Needs (05/17/2023)  Utilities: Not At Risk (05/17/2023)  Depression (PHQ2-9): Medium Risk (08/19/2022)  Financial Resource Strain: Low Risk  (06/20/2022)  Physical Activity: Inactive (06/20/2022)  Social Connections: Socially Isolated (06/20/2022)  Stress: No Stress Concern Present (06/20/2022)  Tobacco Use: Medium Risk (05/17/2023)    Readmission Risk Interventions    09/01/2020    12:30 PM  Readmission Risk Prevention Plan  Transportation Screening Complete  PCP or Specialist Appt within 3-5 Days Complete  HRI or Home Care Consult Complete  Social Work Consult for Recovery Care Planning/Counseling Complete  Palliative Care Screening Not Applicable  Medication Review Oceanographer) Complete

## 2023-05-25 NOTE — Progress Notes (Signed)
PROGRESS NOTE    Michelle [name]  VHQ:[id number] DOB: 1932-08-22 DOA: 05/17/2023 PCP: Shelva Majestic, MD   Brief Narrative: Michelle [name] is a 89 y.o. female with a history of diabetes mellitus, seizures, hypertension, diastolic heart failure, wheelchair bound.  Patient presented secondary to seizure activity. Neurology consulted. Lamotrigine dose adjusted.   Assessment and Plan:  Breakthrough seizure Neurology consulted. Lamotrigine increased. EEG ordered this admission and were significant for no evidence of seizure or definite epileptiform discharges. -Continue lamotrigine  -Versed PRN seizures  Chronic diastolic heart failure Stable.  COPD Stable. -Albuterol as needed  Primary hypertension -Continue Coreg, amlodipine and enalapril  Diabetes mellitus type 2 Well controlled based on prior hemoglobin A1C from 12/2022 of 5.5%. -Continue SSI  Hyperlipidemia -Continue Crestor  Hypomagnesemia Resolved with magnesium repletion.  Diarrhea Improved.  Toxic/metabolic encephalopathy Noted in history. Unclear about offending medication. Patient started on Seroquel to help with insomnia related delirium which has helped. -Continue Seroquel every evening around 1830  CAD Noted. History of PCI. Stable. Episode of chest pain this admission which resolved. Transthoracic Echocardiogram obtained without worsening cardiomyopathy. Troponin negative. -Continue Aspirin, Plavix  Vaginal bleeding Secondary to known endometrial mass. Patient is followed by gynecology oncology. Bleeding improved with resumption of medroxyprogesterone. Hemoglobin has remained stable. -Continue medroxyprogesterone   DVT prophylaxis: SCDs Code Status:   Code Status: Limited: Do not attempt resuscitation (DNR) -DNR-LIMITED -Do Not Intubate/DNI  Family Communication: Son at bedside Disposition Plan: Discharge to SNF pending bed availability. Medically stable for discharge   Consultants:   Neurology  Procedures:  EEG  Antimicrobials: None    Subjective: No concerns this morning. Feels well.  Objective: BP (!) 137/56 (BP Location: Right Arm)   Pulse 68   Temp 97.9 F (36.6 C) (Oral)   Resp 18   Ht 5\' 5"  (1.651 m)   Wt 74.4 kg   SpO2 99%   BMI 27.29 kg/m   Examination:  General exam: Appears calm and comfortable Respiratory system: Clear to auscultation. Respiratory effort normal. Cardiovascular system: S1 & S2 heard, RRR. Central nervous system: Alert and oriented.   Data Reviewed: I have personally reviewed following labs and imaging studies  CBC Lab Results  Component Value Date   WBC 7.1 05/24/2023   RBC 4.60 05/24/2023   HGB 12.0 05/24/2023   HCT 37.7 05/24/2023   MCV 82.0 05/24/2023   MCH 26.1 05/24/2023   PLT 261 05/24/2023   MCHC 31.8 05/24/2023   RDW 13.9 05/24/2023   LYMPHSABS 2.1 05/18/2023   MONOABS 0.6 05/18/2023   EOSABS 0.2 05/18/2023   BASOSABS 0.1 05/18/2023     Last metabolic panel Lab Results  Component Value Date   NA 137 05/24/2023   K 3.8 05/24/2023   CL 107 05/24/2023   CO2 22 05/24/2023   BUN 17 05/24/2023   CREATININE 0.71 05/24/2023   GLUCOSE 117 (H) 05/24/2023   GFRNONAA >60 05/24/2023   GFRAA 77 07/01/2020   CALCIUM 9.1 05/24/2023   PHOS 2.1 (L) 02/07/2023   PROT 6.3 (L) 05/24/2023   ALBUMIN 2.8 (L) 05/24/2023   BILITOT 0.3 05/24/2023   ALKPHOS 54 05/24/2023   AST 36 05/24/2023   ALT 34 05/24/2023   ANIONGAP 8 05/24/2023    GFR: Estimated Creatinine Clearance: 49.1 mL/min (by C-G formula based on SCr of 0.71 mg/dL).  Recent Results (from the past 240 hour(s))  Resp panel by RT-PCR (RSV, Flu A&B, Covid) Anterior Nasal Swab     Status: None  Collection Time: 05/17/23  9:29 AM   Specimen: Anterior Nasal Swab  Result Value Ref Range Status   SARS Coronavirus 2 by RT PCR NEGATIVE NEGATIVE Final   Influenza A by PCR NEGATIVE NEGATIVE Final   Influenza B by PCR NEGATIVE NEGATIVE Final     Comment: (NOTE) The Xpert Xpress SARS-CoV-2/FLU/RSV plus assay is intended as an aid in the diagnosis of influenza from Nasopharyngeal swab specimens and should not be used as a sole basis for treatment. Nasal washings and aspirates are unacceptable for Xpert Xpress SARS-CoV-2/FLU/RSV testing.  Fact Sheet for Patients: BloggerCourse.com  Fact Sheet for Healthcare Providers: SeriousBroker.it  This test is not yet approved or cleared by the [place] FDA and has been authorized for detection and/or diagnosis of SARS-CoV-2 by FDA under an Emergency Use Authorization (EUA). This EUA will remain in effect (meaning this test can be used) for the duration of the COVID-19 declaration under Section 564(b)(1) of the Act, 21 U.S.C. section 360bbb-3(b)(1), unless the authorization is terminated or revoked.     Resp Syncytial Virus by PCR NEGATIVE NEGATIVE Final    Comment: (NOTE) Fact Sheet for Patients: BloggerCourse.com  Fact Sheet for Healthcare Providers: SeriousBroker.it  This test is not yet approved or cleared by the [place] FDA and has been authorized for detection and/or diagnosis of SARS-CoV-2 by FDA under an Emergency Use Authorization (EUA). This EUA will remain in effect (meaning this test can be used) for the duration of the COVID-19 declaration under Section 564(b)(1) of the Act, 21 U.S.C. section 360bbb-3(b)(1), unless the authorization is terminated or revoked.  Performed at [facility], [address], [place], [place] [address]   Culture, Urine (Do not remove urinary catheter, catheter placed by urology or difficult to place)     Status: Abnormal   Collection Time: 05/17/23  2:28 PM   Specimen: In/Out Cath Urine  Result Value Ref Range Status   Specimen Description IN/OUT CATH URINE  Final   Special Requests   Final    NONE Performed at [facility], [address], [place], [place] [address]    Culture (A)  Final    50,000 COLONIES/mL MULTIPLE SPECIES PRESENT, SUGGEST RECOLLECTION   Report Status 05/19/2023 FINAL  Final      Radiology Studies: No results found.    LOS: 7 days    Jacquelin Hawking, MD [facility] 05/25/2023, 12:14 PM   If 7PM-7AM, please contact night-coverage [email or web address]

## 2023-05-25 NOTE — Plan of Care (Signed)
  Problem: Education: Goal: Ability to describe self-care measures that may prevent or decrease complications (Diabetes Survival Skills Education) will improve Outcome: Progressing Goal: Individualized Educational Video(s) Outcome: Progressing   Problem: Coping: Goal: Ability to adjust to condition or change in health will improve Outcome: Progressing   Problem: Fluid Volume: Goal: Ability to maintain a balanced intake and output will improve Outcome: Progressing   Problem: Health Behavior/Discharge Planning: Goal: Ability to identify and utilize available resources and services will improve Outcome: Progressing Goal: Ability to manage health-related needs will improve Outcome: Progressing   Problem: Metabolic: Goal: Ability to maintain appropriate glucose levels will improve Outcome: Progressing   Problem: Nutritional: Goal: Maintenance of adequate nutrition will improve Outcome: Progressing Goal: Progress toward achieving an optimal weight will improve Outcome: Progressing   Problem: Skin Integrity: Goal: Risk for impaired skin integrity will decrease Outcome: Progressing   Problem: Tissue Perfusion: Goal: Adequacy of tissue perfusion will improve Outcome: Progressing   Problem: Education: Goal: Knowledge of General Education information will improve Description: Including pain rating scale, medication(s)/side effects and non-pharmacologic comfort measures Outcome: Progressing   Problem: Nutrition: Goal: Adequate nutrition will be maintained Outcome: Progressing   Problem: Elimination: Goal: Will not experience complications related to bowel motility Outcome: Progressing Goal: Will not experience complications related to urinary retention Outcome: Progressing   Problem: Safety: Goal: Ability to remain free from injury will improve Outcome: Progressing   Problem: Skin Integrity: Goal: Risk for impaired skin integrity will decrease Outcome: Progressing

## 2023-05-25 NOTE — Progress Notes (Signed)
Physical Therapy Treatment Patient Details Name: Michelle [name] MRN: [id number] DOB: 1932-09-03 Today's Date: 05/25/2023   History of Present Illness 89 yo female presents to ED on 10/30 with witnessed seizure. PMH includes HTN, seizures, DMII, HLD, CAD, COPD.    PT Comments  Pt greeted up in chair on arrival and agreeable to session with continued progress towards acute goals. Pt able to come to stand x5 throughout session with min A to boost up and steady while transferring hands from seated surface to RW. Pt requiring mod A to step pivot recliner>BSC with RW and CGA to stand pivot BSC>chair without AD support. Pt able to progress gait this session with min A to steady and +2 for chair follow for safety with max cues for hand placement and upright posture, audible crepitus during gait with each step, distance limited by fatigue and weakness. Pt up in chair with OT handoff at end of session. Current plan remains appropriate to address deficits and maximize functional independence and decrease caregiver burden. Pt continues to benefit from skilled PT services to progress toward functional mobility goals.     If plan is discharge home, recommend the following: Two people to help with walking and/or transfers;Two people to help with bathing/dressing/bathroom   Can travel by private vehicle     No  Equipment Recommendations  None recommended by PT    Recommendations for Other Services       Precautions / Restrictions Precautions Precautions: Fall Restrictions Weight Bearing Restrictions: No     Mobility  Bed Mobility Overal bed mobility: Needs Assistance             General bed mobility comments: pt up in chair on arrival    Transfers Overall transfer level: Needs assistance Equipment used: Rolling walker (2 wheels), None Transfers: Sit to/from Stand, Bed to chair/wheelchair/BSC Sit to Stand: Min assist Stand pivot transfers: Contact guard assist Step pivot transfers: Mod  assist       General transfer comment: min A to power up from recliner x2 and BSC x3 with RW support, mod A to step pivot chair>BSC with RW for management and guidance of RW as pt realeasing grasp and leaving RW behind and blocking self with RW from reaching chair, CGA to stand pivot BSC>chair without AD support    Ambulation/Gait Ambulation/Gait assistance: Min assist, +2 safety/equipment Gait Distance (Feet): 5 Feet Assistive device: Rolling walker (2 wheels) Gait Pattern/deviations: Step-to pattern, Decreased stride length, Antalgic, Trunk flexed Gait velocity: slow     General Gait Details: slow step to gait with RW for support and chair follow for safety, constant cues for upright trunk and to keep hands on RW as pt leaning on forearms   Stairs             Wheelchair Mobility     Tilt Bed    Modified Rankin (Stroke Patients Only)       Balance Overall balance assessment: Needs assistance Sitting-balance support: No upper extremity supported, Feet supported Sitting balance-Leahy Scale: Poor Sitting balance - Comments: supervision for safety   Standing balance support: Bilateral upper extremity supported, During functional activity, Reliant on assistive device for balance Standing balance-Leahy Scale: Poor Standing balance comment: reliant on UE support                            Cognition Arousal: Alert Behavior During Therapy: Flat affect Overall Cognitive Status: Impaired/Different from baseline Area of Impairment: Memory,  Following commands, Safety/judgement, Awareness, Problem solving                     Memory: Decreased short-term memory Following Commands: Follows one step commands with increased time Safety/Judgement: Decreased awareness of safety, Decreased awareness of deficits Awareness: Intellectual Problem Solving: Slow processing, Decreased initiation, Difficulty sequencing, Requires verbal cues, Requires tactile  cues General Comments: increased time to respond to questions and follow commands        Exercises      General Comments General comments (skin integrity, edema, etc.): VSS on RA, son present and supportive      Pertinent Vitals/Pain Pain Assessment Pain Assessment: Faces Faces Pain Scale: Hurts a little bit Pain Location: hips/knees with mobility Pain Descriptors / Indicators: Guarding, Grimacing Pain Intervention(s): Monitored during session, Limited activity within patient's tolerance    Home Living                          Prior Function            PT Goals (current goals can now be found in the care plan section) Acute Rehab PT Goals PT Goal Formulation: With patient Time For Goal Achievement: 06/01/23 Progress towards PT goals: Progressing toward goals    Frequency    Min 1X/week      PT Plan      Co-evaluation              AM-PAC PT "6 Clicks" Mobility   Outcome Measure  Help needed turning from your back to your side while in a flat bed without using bedrails?: A Little Help needed moving from lying on your back to sitting on the side of a flat bed without using bedrails?: A Lot Help needed moving to and from a bed to a chair (including a wheelchair)?: A Lot Help needed standing up from a chair using your arms (e.g., wheelchair or bedside chair)?: A Lot Help needed to walk in hospital room?: Total Help needed climbing 3-5 steps with a railing? : Total 6 Click Score: 11    End of Session   Activity Tolerance: Patient tolerated treatment well Patient left: in chair;with call bell/phone within reach;with family/visitor present;Other (comment) (with OT) Nurse Communication: Mobility status PT Visit Diagnosis: Other abnormalities of gait and mobility (R26.89);Muscle weakness (generalized) (M62.81)     Time: 1040-1059 PT Time Calculation (min) (ACUTE ONLY): 19 min  Charges:    $Therapeutic Activity: 8-22 mins PT General  Charges $$ ACUTE PT VISIT: 1 Visit                     Hannalee Castor R. PTA Acute Rehabilitation Services Office: [phone]   Catalina Antigua 05/25/2023, 11:08 AM

## 2023-05-25 NOTE — Progress Notes (Signed)
Occupational Therapy Treatment Patient Details Name: Michelle [name] MRN: [id number] DOB: 08-19-1932 Today's Date: 05/25/2023   History of present illness 89 yo female presents to ED on 10/30 with witnessed seizure. PMH includes HTN, seizures, DMII, HLD, CAD, COPD.   OT comments  This 89 yo is making progress with sit<>stand, ambulation, transfers, and LBD. She will continue to benefit from acute OT with follow up from continued inpatient follow up therapy, <3 hours/day.       If plan is discharge home, recommend the following:  A lot of help with walking and/or transfers;A lot of help with bathing/dressing/bathroom;Assistance with cooking/housework;Assist for transportation;Help with stairs or ramp for entrance   Equipment Recommendations  Other (comment) (TBD at next venue (has progressed beyond hoyer lift))       Precautions / Restrictions Precautions Precautions: Fall Restrictions Weight Bearing Restrictions: No       Mobility Bed Mobility               General bed mobility comments: pt standing with PT upon arrival    Transfers Overall transfer level: Needs assistance Equipment used: Rolling walker (2 wheels), None Transfers: Sit to/from Stand, Bed to chair/wheelchair/BSC Sit to Stand: Min assist     Step pivot transfers: Mod assist           Balance Overall balance assessment: Needs assistance Sitting-balance support: No upper extremity supported, Feet supported Sitting balance-Leahy Scale: Fair     Standing balance support: Bilateral upper extremity supported, During functional activity, Reliant on assistive device for balance Standing balance-Leahy Scale: Poor Standing balance comment: reliant on UE support                           ADL either performed or assessed with clinical judgement   ADL Overall ADL's : Needs assistance/impaired                       Lower Body Dressing Details (indicate cue type and reason): Pt able  to cross legs to get to feet to doff and donn socks, some times she has to use her arms to help to get her legs crossed; min A sit<>stand Toilet Transfer: +2 for safety/equipment;Moderate assistance Toilet Transfer Details (indicate cue type and reason): simulated standing up from recliner, taking 5 steps forward and sitting down in recliner behind her; min A sit<>stand, Mod A to take steps Toileting- Clothing Manipulation and Hygiene: Total assistance Toileting - Clothing Manipulation Details (indicate cue type and reason): min A standing and sit<>stand            Extremity/Trunk Assessment Upper Extremity Assessment Upper Extremity Assessment: Generalized weakness (decreased AROM of shoulders unless she leans back (has support))            Vision Baseline Vision/History: 0 No visual deficits Vision Assessment?: No apparent visual deficits          Cognition Arousal: Alert Behavior During Therapy: Flat affect Overall Cognitive Status: Impaired/Different from baseline Area of Impairment: Memory, Following commands, Safety/judgement, Awareness, Problem solving                     Memory: Decreased short-term memory Following Commands: Follows one step commands with increased time Safety/Judgement: Decreased awareness of safety, Decreased awareness of deficits Awareness: Intellectual Problem Solving: Slow processing, Decreased initiation, Difficulty sequencing, Requires verbal cues, Requires tactile cues General Comments: increased time to respond to questions and follow commands  General Comments VSS on RA, son present and supportive    Pertinent Vitals/ Pain       Pain Assessment Pain Assessment: Faces Faces Pain Scale: Hurts little more Pain Location: both knees Pain Descriptors / Indicators: Aching, Sore, Grimacing Pain Intervention(s): Limited activity within patient's tolerance, Monitored during session, Repositioned         Frequency   Min 1X/week        Progress Toward Goals  OT Goals(current goals can now be found in the care plan section)  Progress towards OT goals: Progressing toward goals  Acute Rehab OT Goals Patient Stated Goal: to rest OT Goal Formulation: With patient/family Time For Goal Achievement: 06/02/23 Potential to Achieve Goals: Good         AM-PAC OT "6 Clicks" Daily Activity     Outcome Measure   Help from another person eating meals?: None Help from another person taking care of personal grooming?: A Little Help from another person toileting, which includes using toliet, bedpan, or urinal?: A Lot Help from another person bathing (including washing, rinsing, drying)?: A Lot Help from another person to put on and taking off regular upper body clothing?: A Lot Help from another person to put on and taking off regular lower body clothing?: A Lot 6 Click Score: 15    End of Session Equipment Utilized During Treatment: Gait belt;Other (comment);Rolling walker (2 wheels)  OT Visit Diagnosis: Unsteadiness on feet (R26.81);Other abnormalities of gait and mobility (R26.89);Muscle weakness (generalized) (M62.81);Pain Pain - Right/Left:  (both) Pain - part of body:  (knees)   Activity Tolerance Patient limited by fatigue   Patient Left in chair;with call bell/phone within reach;with chair alarm set;with family/visitor present           Time: 1540-0867 OT Time Calculation (min): 20 min  Charges: OT General Charges $OT Visit: 1 Visit OT Treatments $Self Care/Home Management : 8-22 mins  Lindon Romp OT Acute Rehabilitation Services Office [phone]   Evette Georges 05/25/2023, 11:36 AM

## 2023-05-25 NOTE — Plan of Care (Signed)
  Problem: Coping: Goal: Ability to adjust to condition or change in health will improve Outcome: Progressing   Problem: Fluid Volume: Goal: Ability to maintain a balanced intake and output will improve Outcome: Progressing   Problem: Metabolic: Goal: Ability to maintain appropriate glucose levels will improve Outcome: Progressing   Problem: Skin Integrity: Goal: Risk for impaired skin integrity will decrease Outcome: Progressing   Problem: Tissue Perfusion: Goal: Adequacy of tissue perfusion will improve Outcome: Progressing   Problem: Elimination: Goal: Will not experience complications related to bowel motility Outcome: Progressing Goal: Will not experience complications related to urinary retention Outcome: Progressing

## 2023-05-25 NOTE — Telephone Encounter (Signed)
Holding until needed by patient.

## 2023-05-26 ENCOUNTER — Other Ambulatory Visit (HOSPITAL_COMMUNITY): Payer: Self-pay

## 2023-05-26 ENCOUNTER — Ambulatory Visit: Payer: Medicare HMO | Admitting: Family Medicine

## 2023-05-26 DIAGNOSIS — R4182 Altered mental status, unspecified: Secondary | ICD-10-CM | POA: Diagnosis not present

## 2023-05-26 DIAGNOSIS — N9489 Other specified conditions associated with female genital organs and menstrual cycle: Secondary | ICD-10-CM | POA: Diagnosis not present

## 2023-05-26 DIAGNOSIS — R410 Disorientation, unspecified: Secondary | ICD-10-CM | POA: Diagnosis not present

## 2023-05-26 DIAGNOSIS — R569 Unspecified convulsions: Secondary | ICD-10-CM | POA: Diagnosis not present

## 2023-05-26 DIAGNOSIS — M6259 Muscle wasting and atrophy, not elsewhere classified, multiple sites: Secondary | ICD-10-CM | POA: Diagnosis not present

## 2023-05-26 DIAGNOSIS — G40909 Epilepsy, unspecified, not intractable, without status epilepticus: Secondary | ICD-10-CM | POA: Diagnosis not present

## 2023-05-26 DIAGNOSIS — Z741 Need for assistance with personal care: Secondary | ICD-10-CM | POA: Diagnosis not present

## 2023-05-26 DIAGNOSIS — I6900 Unspecified sequelae of nontraumatic subarachnoid hemorrhage: Secondary | ICD-10-CM | POA: Diagnosis not present

## 2023-05-26 DIAGNOSIS — E119 Type 2 diabetes mellitus without complications: Secondary | ICD-10-CM | POA: Diagnosis not present

## 2023-05-26 DIAGNOSIS — G4089 Other seizures: Secondary | ICD-10-CM | POA: Diagnosis not present

## 2023-05-26 DIAGNOSIS — G40919 Epilepsy, unspecified, intractable, without status epilepticus: Secondary | ICD-10-CM | POA: Diagnosis not present

## 2023-05-26 DIAGNOSIS — M6281 Muscle weakness (generalized): Secondary | ICD-10-CM | POA: Diagnosis not present

## 2023-05-26 DIAGNOSIS — J449 Chronic obstructive pulmonary disease, unspecified: Secondary | ICD-10-CM | POA: Diagnosis not present

## 2023-05-26 DIAGNOSIS — G9341 Metabolic encephalopathy: Secondary | ICD-10-CM | POA: Diagnosis not present

## 2023-05-26 DIAGNOSIS — R488 Other symbolic dysfunctions: Secondary | ICD-10-CM | POA: Diagnosis not present

## 2023-05-26 DIAGNOSIS — R296 Repeated falls: Secondary | ICD-10-CM | POA: Diagnosis not present

## 2023-05-26 DIAGNOSIS — I10 Essential (primary) hypertension: Secondary | ICD-10-CM | POA: Diagnosis not present

## 2023-05-26 DIAGNOSIS — R278 Other lack of coordination: Secondary | ICD-10-CM | POA: Diagnosis not present

## 2023-05-26 DIAGNOSIS — F331 Major depressive disorder, recurrent, moderate: Secondary | ICD-10-CM | POA: Diagnosis not present

## 2023-05-26 DIAGNOSIS — I5032 Chronic diastolic (congestive) heart failure: Secondary | ICD-10-CM | POA: Diagnosis not present

## 2023-05-26 DIAGNOSIS — N390 Urinary tract infection, site not specified: Secondary | ICD-10-CM | POA: Diagnosis not present

## 2023-05-26 DIAGNOSIS — Z23 Encounter for immunization: Secondary | ICD-10-CM | POA: Diagnosis not present

## 2023-05-26 DIAGNOSIS — R41841 Cognitive communication deficit: Secondary | ICD-10-CM | POA: Diagnosis not present

## 2023-05-26 DIAGNOSIS — Z7401 Bed confinement status: Secondary | ICD-10-CM | POA: Diagnosis not present

## 2023-05-26 LAB — GLUCOSE, CAPILLARY: Glucose-Capillary: 106 mg/dL — ABNORMAL HIGH (ref 70–99)

## 2023-05-26 MED ORDER — ORAL CARE MOUTH RINSE: 15.0000 mL | OROMUCOSAL | Status: DC | PRN

## 2023-05-26 MED ORDER — QUETIAPINE FUMARATE 25 MG PO TABS: 25.0000 mg | ORAL_TABLET | Freq: Every evening | ORAL | Status: DC

## 2023-05-26 MED ORDER — LAMOTRIGINE 25 MG PO TABS: ORAL_TABLET | ORAL | Status: DC

## 2023-05-26 MED ORDER — VALTOCO 15 MG DOSE 7.5 MG/0.1ML NA LQPK: 15.0000 mg | NASAL | 0 refills | Status: DC | PRN

## 2023-05-26 NOTE — Care Management Important Message (Signed)
Important Message  Patient Details  Name: Michelle [name] MRN: [id number] Date of Birth: [birth year]   Important Message Given:  Yes - Medicare IM     Dorena Bodo 05/26/2023, 12:11 PM

## 2023-05-26 NOTE — Consult Note (Signed)
[facility] Liaison Consult Note  05/26/2023  KIMERLY DUMARS 02-06-1933 [id number]  Insurance: [employer]   Primary Care Provider: Shelva Majestic, MD with [place] at [facility], this provider is listed for the transition of care follow up appointments  and calls   [facility] Liaison screened the patient remotely at [facility]. Patient had transitioned to [facility]    The patient was screened for 9 day LOS high risk score for unplanned readmission risk 2 ED and 2 hospital admissions in 6 months.  Plan: Needs are to be met a SNF level of care for transition.   [facility] does not replace or interfere with any arrangements made by the Inpatient Transition of Care team.   For questions contact:   Charlesetta Shanks, RN, BSN, CCM [place]  [facility] Liaison Direct Dial: [phone] or secure chat Email: [email or web address]

## 2023-05-26 NOTE — Discharge Summary (Signed)
Physician Discharge Summary   Patient: Michelle [name] MRN: [id number] DOB: August 23, 1932  Admit date:     05/17/2023  Discharge date: 05/26/23  Discharge Physician: Jacquelin Hawking, MD   PCP: Shelva Majestic, MD   Recommendations at discharge:  PCP visit for hospital follow-up Neurology visit for hospital follow-up  Discharge Diagnoses: Principal Problem:   Breakthrough seizure [facility]) Active Problems:   Type 2 diabetes mellitus without complication, with long-term current use of insulin (HCC)   Hyperlipidemia associated with type 2 diabetes mellitus (HCC)   Essential hypertension   COPD (chronic obstructive pulmonary disease) (HCC)   Chronic diastolic CHF (congestive heart failure) (HCC)   Wheelchair dependence   Seizure (HCC)   DNR (do not resuscitate)/DNI(Do Not Intubate)   Hypomagnesemia  Resolved Problems:   * No resolved hospital problems. *  Hospital Course: Michelle [name] is a 89 y.o. female with a history of diabetes mellitus, seizures, hypertension, diastolic heart failure, wheelchair bound.  Patient presented secondary to seizure activity. Neurology consulted. Lamotrigine dose adjusted.  Assessment and Plan:  Breakthrough seizure Neurology consulted. Lamotrigine increased. EEG ordered this admission and were significant for no evidence of seizure or definite epileptiform discharges. Continue lamotrigine, dose increased prior to discharge. Diazepam intranasal as needed for seizures lasting more than 2 minutes per neurology.   Chronic diastolic heart failure Stable.   COPD Stable. Continue Advair and albuterol.   Primary hypertension Continue Coreg, amlodipine and enalapril. Hydralazine and spironolactone discontinued on discharge.   Diabetes mellitus type 2 Well controlled based on prior hemoglobin A1C from 12/2022 of 5.5%. Patient is prescribed insuline glargine and aspart, which are discontinued on discharge. Diet modification. Monitor A1C for need of additional  therapies.   Hyperlipidemia Continue Crestor   Hypomagnesemia Resolved with magnesium repletion.   Diarrhea Improved.   Toxic/metabolic encephalopathy Noted in history. Unclear about offending medication. Patient started on Seroquel to help with insomnia related delirium which has helped.Continue Seroquel every evening around 1830-1900.   CAD Noted. History of PCI. Stable. Episode of chest pain this admission which resolved. Transthoracic Echocardiogram obtained without worsening cardiomyopathy. Troponin negative. Continue Aspirin, Plavix.   Vaginal bleeding Secondary to known endometrial mass. Patient is followed by gynecology oncology. Bleeding improved with resumption of medroxyprogesterone. Hemoglobin has remained stable. Continue medroxyprogesterone.   Consultants:  Neurology   Procedures:  EEG   Disposition: Skilled nursing facility Diet recommendation: Heart healthy/carb modified   DISCHARGE MEDICATION: Allergies as of 05/26/2023       Reactions   Codeine Sulfate Nausea Only   Morphine Sulfate Nausea Only        Medication List     STOP taking these medications    Droplet Pen Needles 31G X 8 MM Misc Generic drug: Insulin Pen Needle   hydrALAZINE 25 MG tablet Commonly known as: APRESOLINE   Lantus SoloStar 100 UNIT/ML Solostar Pen Generic drug: insulin glargine   Melatonin 5 MG Caps   mirtazapine 7.5 MG tablet Commonly known as: REMERON   nortriptyline 10 MG capsule Commonly known as: PAMELOR   spironolactone 25 MG tablet Commonly known as: ALDACTONE       TAKE these medications    Accu-Chek FastClix Lancet Kit 1 Device by Does not apply route. Use as directed for testing blood sugar   Accu-Chek FastClix Lancet Kit Use as directed to test blood sugar   acetaminophen 650 MG CR tablet Commonly known as: TYLENOL Take 1,300 mg by mouth 3 (three) times daily.   albuterol (2.5 MG/3ML)  0.083% nebulizer solution Commonly known as:  PROVENTIL USE THREE MILLILITERS VIA NEBULIZATION BY MOUTH EVERY 6 HOURS AS NEEDED FOR WHEEZING OR SHORTNESS OF BREATH What changed: See the new instructions.   albuterol 108 (90 Base) MCG/ACT inhaler Commonly known as: VENTOLIN HFA INHALE 1 PUFF EVERY 4 HOURS AS NEEDED FOR WHEEZING, SHORTNESS OF BREATH (RESCUE INHALER IF ADVAIR NOT WORKING) What changed: See the new instructions.   amLODipine 2.5 MG tablet Commonly known as: NORVASC Take 2.5 mg in the morning. If afternoon blood pressure >145/90 take second tablet of 2.5 What changed:  how much to take how to take this when to take this additional instructions   aspirin 81 MG chewable tablet Chew 81 mg by mouth daily.   blood glucose meter kit and supplies Kit Dispense based on patient and insurance preference. Use up to four times daily as directed. Dx:E11.9   calcium carbonate 1500 (600 Ca) MG Tabs tablet Commonly known as: OSCAL Take 600 mg of elemental calcium by mouth daily.   carvedilol 12.5 MG tablet Commonly known as: COREG Take 1 tablet (12.5 mg total) by mouth 2 (two) times daily.   cholecalciferol 25 MCG (1000 UNIT) tablet Commonly known as: VITAMIN D3 Take 1,000 Units by mouth daily with breakfast.   clopidogrel 75 MG tablet Commonly known as: PLAVIX TAKE 1 TABLET EVERY DAY   cyanocobalamin 1000 MCG tablet Commonly known as: VITAMIN B12 Take 1,000 mcg by mouth daily with breakfast.   diclofenac Sodium 1 % Gel Commonly known as: VOLTAREN Apply 2 g topically 4 (four) times daily.   enalapril 20 MG tablet Commonly known as: VASOTEC Take 1 tablet (20 mg total) by mouth at bedtime. What changed: when to take this   fenofibrate micronized 134 MG capsule Commonly known as: LOFIBRA TAKE 1 CAPSULE EVERY DAY   ferrous sulfate 325 (65 FE) MG tablet Twice a week What changed:  how much to take how to take this when to take this additional instructions   fluticasone-salmeterol 250-50 MCG/ACT  Aepb Commonly known as: ADVAIR INHALE 1 PUFF TWICE DAILY   folic acid 400 MCG tablet Commonly known as: FOLVITE Take 400 mcg by mouth at bedtime.   furosemide 20 MG tablet Commonly known as: LASIX Take 1 tablet (20 mg total) by mouth as needed for fluid or edema.   icosapent Ethyl 1 g capsule Commonly known as: Vascepa Take 2 capsules (2 g total) by mouth 2 (two) times daily. What changed:  how much to take when to take this additional instructions   lamoTRIgine 25 MG tablet Commonly known as: LAMICTAL Take 1 tablet (25 mg total) by mouth in the morning AND 2 tablets (50 mg total) at bedtime. What changed: See the new instructions.   loperamide 2 MG tablet Commonly known as: IMODIUM A-D Take 2-4 mg by mouth 2 (two) times daily as needed for diarrhea or loose stools.   loratadine 10 MG tablet Commonly known as: CLARITIN Take 10 mg by mouth every other day.   medroxyPROGESTERone 10 MG tablet Commonly known as: Provera Take 1 tablet (10 mg total) by mouth daily. What changed: when to take this   nitroGLYCERIN 0.4 MG SL tablet Commonly known as: NITROSTAT DISSOLVE 1 TAB UNDER TONGUE FOR CHEST PAIN - IF PAIN REMAINS AFTER 5 MIN, CALL 911 AND REPEAT DOSE. MAX 3 TABS IN 15 MINUTES What changed:  how much to take how to take this when to take this reasons to take this   pantoprazole 40  MG tablet Commonly known as: PROTONIX TAKE 1 TABLET EVERY DAY   psyllium 58.6 % packet Commonly known as: METAMUCIL Take 1 packet by mouth as needed (fiber).   QUEtiapine 25 MG tablet Commonly known as: SEROQUEL Take 1 tablet (25 mg total) by mouth every evening. Between 1830 and 1900   rosuvastatin 40 MG tablet Commonly known as: CRESTOR Take 1 tablet (40 mg total) by mouth daily.   sertraline 50 MG tablet Commonly known as: ZOLOFT TAKE 2 TABLETS AT BEDTIME   Spiriva HandiHaler 18 MCG inhalation capsule Generic drug: tiotropium PLACE 1 CAPSULE INTO HANDIHALER AND INHALE THE  CONTENTS DAILY What changed: See the new instructions.   True Metrix Blood Glucose Test test strip Generic drug: glucose blood TEST BLOOD SUGAR UP TO FOUR TIMES DAILY AS DIRECTED   TRUEplus Lancets 33G Misc TEST BLOOD SUGAR UP TO FOUR TIMES DAILY AS DIRECTED   Valtoco 15 MG Dose 7.5 MG/0.1ML Lqpk Generic drug: diazePAM (15 MG Dose) Place 15 mg into the nose as needed (seizure lasting more than 2 minutes).   vitamin E 180 MG (400 UNITS) capsule Take 400 Units by mouth at bedtime.        Discharge Exam: BP (!) 150/62 (BP Location: Right Arm)   Pulse 66   Temp 98.5 F (36.9 C) (Oral)   Resp 18   Ht 5\' 5"  (1.651 m)   Wt 73.3 kg   SpO2 95%   BMI 26.89 kg/m   General exam: Appears calm and comfortable Respiratory system: Respiratory effort normal. Central nervous system: Alert and oriented. Psychiatry: Judgement and insight appear normal. Mood & affect appropriate.   Condition at discharge: stable  The results of significant diagnostics from this hospitalization (including imaging, microbiology, ancillary and laboratory) are listed below for reference.   Imaging Studies: ECHOCARDIOGRAM LIMITED  Result Date: 05/22/2023    ECHOCARDIOGRAM LIMITED REPORT   Patient Name:   Nakeesha A Kinnick Date of Exam: 05/22/2023 Medical Rec #:  [id number]       Height:       65.0 in Accession #:    [id number]      Weight:       168.0 lb Date of Birth:  [birth year]       BSA:          1.837 m Patient Age:    89 years        BP:           168/56 mmHg Patient Gender: F               HR:           70 bpm. Exam Location:  Inpatient Procedure: Limited Echo Indications:    Chest Pain R07.9  History:        Patient has prior history of Echocardiogram examinations, most                 recent 02/17/2023. CAD, COPD; Risk Factors:Hypertension and                 Diabetes.  Sonographer:    Darlys Gales Referring Phys: [phone] STEPHEN K CHIU IMPRESSIONS  1. Left ventricular ejection fraction, by estimation, is 60 to  65%. The left ventricle has normal function. There is mild concentric left ventricular hypertrophy.  2. Right ventricular systolic function is normal.  3. Left atrial size was moderately dilated.  4. The mitral valve is degenerative.  5. The aortic valve is tricuspid. There is mild  calcification of the aortic valve.  6. The inferior vena cava is normal in size with greater than 50% respiratory variability, suggesting right atrial pressure of 3 mmHg. Conclusion(s)/Recommendation(s): Limited echo to reassess EF. EF now normal 60-65%. FINDINGS  Left Ventricle: Left ventricular ejection fraction, by estimation, is 60 to 65%. The left ventricle has normal function. There is mild concentric left ventricular hypertrophy. Right Ventricle: Right ventricular systolic function is normal. Left Atrium: Left atrial size was moderately dilated. Pericardium: There is no evidence of pericardial effusion. Mitral Valve: The mitral valve is degenerative in appearance. Mild to moderate mitral annular calcification. Tricuspid Valve: The tricuspid valve is grossly normal. Aortic Valve: The aortic valve is tricuspid. There is mild calcification of the aortic valve. Venous: The inferior vena cava is normal in size with greater than 50% respiratory variability, suggesting right atrial pressure of 3 mmHg. LEFT VENTRICLE PLAX 2D LVOT diam:     1.80 cm LVOT Area:     2.54 cm  LEFT ATRIUM           Index        RIGHT ATRIUM           Index LA Vol (A4C): 50.3 ml 27.38 ml/m  RA Area:     14.60 cm                                    RA Volume:   31.90 ml  17.37 ml/m   SHUNTS Systemic Diam: 1.80 cm Arvilla Meres MD Electronically signed by Arvilla Meres MD Signature Date/Time: 05/22/2023/9:40:12 AM    Final    Overnight EEG with video  Result Date: 05/18/2023 Charlsie Quest, MD     05/19/2023  9:53 AM Patient Name: STAMATIA WISSE MRN: [id number] Epilepsy Attending: Charlsie Quest Referring Physician/Provider: Charlsie Quest, MD  Duration: 05/17/2023 1502 to 05/18/2023 1930 Patient history:  89 year old female with history of dementia and epilepsy presented with breakthrough seizure-like episode. Level of alertness: Awake, asleep AEDs during EEG study: LEV, ativan, LCM Technical aspects: This EEG study was done with scalp electrodes positioned according to the 10-20 International system of electrode placement. Electrical activity was reviewed with band pass filter of 1-70Hz , sensitivity of 7 uV/mm, display speed of 18mm/sec with a 60Hz  notched filter applied as appropriate. EEG data were recorded continuously and digitally stored.  Video monitoring was available and reviewed as appropriate. Description: At the beginning of study, EEG showed continuous generalized 3 to 6 Hz theta-delta slowing. Gradually EEG improved and showed posterior dominant rhythm of 8 Hz activity of moderate voltage (25-35 uV) seen predominantly in posterior head regions, symmetric and reactive to eye opening and eye closing. Sleep was characterized by vertex waves, sleep spindles (12 to 14 Hz), maximal frontocentral region. Hyperventilation and photic stimulation were not performed.   EEG was disconnected between 05/17/2023 1907 to 2320 as patient was bring moved to a different room. ABNORMALITY - Continuous slow, generalized IMPRESSION: This study was initially suggestive of moderate diffuse encephalopathy. Gradually EEG improved and was within normal limits. No seizures or definite epileptiform discharges were seen throughout the recording. Lack of epileptiform activity during interictal EEG does not exclude the diagnosis of epilepsy. Charlsie Quest   CT Head Wo Contrast  Result Date: 05/17/2023 CLINICAL DATA:  Provided history: Altered mental status, nontraumatic. Seizures. EXAM: CT HEAD WITHOUT CONTRAST TECHNIQUE: Contiguous axial images were obtained from the base  of the skull through the vertex without intravenous contrast. RADIATION DOSE REDUCTION: This  exam was performed according to the departmental dose-optimization program which includes automated exposure control, adjustment of the mA and/or kV according to patient size and/or use of iterative reconstruction technique. COMPARISON:  Head CT 03/01/2023. FINDINGS: Brain: Generalized cerebral atrophy. Patchy and ill-defined hypoattenuation within the cerebral white matter, nonspecific but compatible with mild chronic small vessel ischemic disease. There is no acute intracranial hemorrhage. No demarcated cortical infarct. No extra-axial fluid collection. No evidence of an intracranial mass. No midline shift. Vascular: No hyperdense vessel.  Atherosclerotic calcifications. Skull: No calvarial fracture or aggressive osseous lesion. Sinuses/Orbits: Bilateral proptosis. Chronic fracture deformity of the right orbital floor. No significant paranasal sinus disease. IMPRESSION: 1. No evidence of an acute intracranial abnormality. 2. Mild chronic small vessel ischemic changes within the cerebral white matter. 3. Cerebral atrophy. 4. Bilateral proptosis. Electronically Signed   By: Jackey Loge D.O.   On: 05/17/2023 11:48   DG Chest Portable 1 View  Result Date: 05/17/2023 CLINICAL DATA:  89 year old female with cough, shortness of breath, seizure. EXAM: PORTABLE CHEST 1 VIEW COMPARISON:  Portable chest 02/15/2023 and earlier. FINDINGS: Portable AP upright view at 0939 hours. Lower lung volumes. Calcified aortic atherosclerosis. Stable cardiomegaly and mediastinal contours. Questionable rightward tracheal or gas containing esophagus adeviation compared to a July chest CT, and also a cervical spine CT 03/01/2023. The patient's head is turned to the left. Chronic asymmetrically increased density at the right thoracic inlet is related to tortuous brachiocephalic artery and great vessels on the prior CT. No pneumothorax, pleural effusion, consolidation. Coarse bilateral pulmonary interstitial markings appear stable.  Paucity of bowel gas. Stable cholecystectomy clips. No acute osseous abnormality identified. IMPRESSION: 1.  No acute cardiopulmonary abnormality. 2. Probably gas containing nondilated esophagus, rather than trachea, which is deviated to the right at the thoracic inlet. Significance is doubtful. 3.  Aortic Atherosclerosis (ICD10-I70.0). Electronically Signed   By: Odessa Fleming M.D.   On: 05/17/2023 09:48    Microbiology: Results for orders placed or performed during the hospital encounter of 05/17/23  Resp panel by RT-PCR (RSV, Flu A&B, Covid) Anterior Nasal Swab     Status: None   Collection Time: 05/17/23  9:29 AM   Specimen: Anterior Nasal Swab  Result Value Ref Range Status   SARS Coronavirus 2 by RT PCR NEGATIVE NEGATIVE Final   Influenza A by PCR NEGATIVE NEGATIVE Final   Influenza B by PCR NEGATIVE NEGATIVE Final    Comment: (NOTE) The Xpert Xpress SARS-CoV-2/FLU/RSV plus assay is intended as an aid in the diagnosis of influenza from Nasopharyngeal swab specimens and should not be used as a sole basis for treatment. Nasal washings and aspirates are unacceptable for Xpert Xpress SARS-CoV-2/FLU/RSV testing.  Fact Sheet for Patients: BloggerCourse.com  Fact Sheet for Healthcare Providers: SeriousBroker.it  This test is not yet approved or cleared by the [place] FDA and has been authorized for detection and/or diagnosis of SARS-CoV-2 by FDA under an Emergency Use Authorization (EUA). This EUA will remain in effect (meaning this test can be used) for the duration of the COVID-19 declaration under Section 564(b)(1) of the Act, 21 U.S.C. section 360bbb-3(b)(1), unless the authorization is terminated or revoked.     Resp Syncytial Virus by PCR NEGATIVE NEGATIVE Final    Comment: (NOTE) Fact Sheet for Patients: BloggerCourse.com  Fact Sheet for Healthcare  Providers: SeriousBroker.it  This test is not yet approved or cleared by the [place] FDA  and has been authorized for detection and/or diagnosis of SARS-CoV-2 by FDA under an Emergency Use Authorization (EUA). This EUA will remain in effect (meaning this test can be used) for the duration of the COVID-19 declaration under Section 564(b)(1) of the Act, 21 U.S.C. section 360bbb-3(b)(1), unless the authorization is terminated or revoked.  Performed at [facility], [address] [place], [place] [address]   Culture, Urine (Do not remove urinary catheter, catheter placed by urology or difficult to place)     Status: Abnormal   Collection Time: 05/17/23  2:28 PM   Specimen: In/Out Cath Urine  Result Value Ref Range Status   Specimen Description IN/OUT CATH URINE  Final   Special Requests   Final    NONE Performed at [facility], [address] [place], [place] [address]    Culture (A)  Final    50,000 COLONIES/mL MULTIPLE SPECIES PRESENT, SUGGEST RECOLLECTION   Report Status 05/19/2023 FINAL  Final   *Note: Due to a large number of results and/or encounters for the requested time period, some results have not been displayed. A complete set of results can be found in Results Review.    Labs: CBC: Recent Labs  Lab 05/20/23 0528 05/22/23 0637 05/23/23 0451 05/24/23 0445  WBC 8.9 10.8* 7.4 7.1  HGB 12.3 12.4 11.9* 12.0  HCT 37.8 39.2 37.5 37.7  MCV 80.4 83.6 81.9 82.0  PLT 343 287 263 261   Basic Metabolic Panel: Recent Labs  Lab 05/20/23 0528 05/22/23 0637 05/23/23 0451 05/24/23 0445  NA 138 138 136 137  K 3.6 3.8 4.1 3.8  CL 106 103 104 107  CO2 23 25 24 22   GLUCOSE 150* 158* 136* 117*  BUN 16 17 17 17   CREATININE 0.82 0.77 0.89 0.71  CALCIUM 9.3 9.3 9.3 9.1   Liver Function Tests: Recent Labs  Lab 05/20/23 0528 05/22/23 0637 05/23/23 0451 05/24/23 0445  AST 38 29 27 36  ALT 33 30 27 34  ALKPHOS 50 52 48 54   BILITOT 0.4 0.4 0.4 0.3  PROT 6.5 6.4* 6.4* 6.3*  ALBUMIN 3.1* 3.0* 2.9* 2.8*   CBG: Recent Labs  Lab 05/25/23 0815 05/25/23 1205 05/25/23 1641 05/25/23 2130 05/26/23 0623  GLUCAP 111* 186* 119* 131* 106*    Discharge time spent: 35 minutes.  Signed: Jacquelin Hawking, MD [facility] 05/26/2023

## 2023-05-26 NOTE — TOC Initial Note (Signed)
Transition of Care [facility]) - Initial/Assessment Note    Patient Details  Name: Michelle [name] MRN: [id number] Date of Birth: [birth year]  Transition of Care [facility]) CM/SW Contact:    Michelle [name], Student-Social Work Phone Number: 05/26/2023, 10:40 AM  Clinical Narrative: MSW Student received insurance approval for patient to admit to [facility]. MSW student confirmed with MD that patient is stable for discharge. MSW Student notified son Michelle Hua and they are in agreement with discharge. MSW Student confirmed bed availaible at [facility]. Transport arranged with [facility] for next available.   Number to call report :  [phone]    Expected Discharge Plan: Skilled Nursing Facility Barriers to Discharge: Barriers Resolved   Patient Goals and CMS Choice            Expected Discharge Plan and Services   Discharge Planning Services: CM Consult   Living arrangements for the past 2 months: Single Family Home Expected Discharge Date: 05/26/23                                    Prior Living Arrangements/Services Living arrangements for the past 2 months: Single Family Home Lives with:: Adult Children Patient language and need for interpreter reviewed:: Yes Do you feel safe going back to the place where you live?: Yes        Care giver support system in place?: Yes (comment) Current home services: DME (wheelchair) Criminal Activity/Legal Involvement Pertinent to Current Situation/Hospitalization: No - Comment as needed  Activities of Daily Living   ADL Screening (condition at time of admission) Independently performs ADLs?: No Does the patient have a NEW difficulty with bathing/dressing/toileting/self-feeding that is expected to last >3 days?: No Does the patient have a NEW difficulty with getting in/out of bed, walking, or climbing stairs that is expected to last >3 days?: No Does the patient have a NEW difficulty with communication that is expected to last >3 days?:  No Is the patient deaf or have difficulty hearing?: No Does the patient have difficulty seeing, even when wearing glasses/contacts?: No Does the patient have difficulty concentrating, remembering, or making decisions?: Yes  Permission Sought/Granted                  Emotional Assessment Appearance:: Appears stated age         Psych Involvement: No (comment)  Admission diagnosis:  Status epilepticus (HCC) [G40.901] Seizure (HCC) [R56.9] Breakthrough seizure (HCC) [G40.919] Patient Active Problem List   Diagnosis Date Noted   Breakthrough seizure (HCC) 05/17/2023   Wheelchair dependence 05/17/2023   DNR (do not resuscitate)/DNI(Do Not Intubate) 05/17/2023   Hypomagnesemia 05/17/2023   Seizure (HCC) 02/06/2023   Memory impairment 09/09/2022   Chronic pain of both knees 04/05/2022   Epilepsy (HCC) 03/12/2021   Adnexal mass 11/24/2020   Thickened endometrium 11/24/2020   Dementia (HCC) 10/22/2020   Actinic keratosis 11/17/2017   Anemia, iron deficiency 10/23/2015   CKD (chronic kidney disease), stage III (HCC) 07/25/2014   Osteoarthritis, knee 05/13/2014   Anxiety state 04/21/2014   Chronic diastolic CHF (congestive heart failure) (HCC) 03/13/2014   SOB (shortness of breath) 03/05/2014   CAD- RCA PCI '90s, STEMI-RCA DES 02/18/14 03/05/2014   Back pain, lumbosacral 10/10/2012   Type 2 diabetes mellitus without complication, with long-term current use of insulin (HCC) 03/26/2007   Hyperlipidemia associated with type 2 diabetes mellitus (HCC) 03/26/2007   Essential hypertension 04/21/2006  COPD (chronic obstructive pulmonary disease) (HCC) 04/21/2006   PCP:  Shelva Majestic, MD Pharmacy:   [facility] [id number] - [address] [place] - [address] [facility] [address] [place] [address] Phone: [phone] Fax: [phone]  [facility] - [place], [place] - [address] [place] [address] Phone:  [phone] Fax: [phone]  [place] [facility] [address] [place] [address] Phone: [phone] Fax: [phone]  [facility] [address] [place] [address] Phone: [phone] Fax: [phone]     Social Determinants of Health (SDOH) Social History: SDOH Screenings   Food Insecurity: No Food Insecurity (05/17/2023)  Housing: Patient Declined (05/17/2023)  Transportation Needs: No Transportation Needs (05/17/2023)  Utilities: Not At Risk (05/17/2023)  Depression (PHQ2-9): Medium Risk (08/19/2022)  Financial Resource Strain: Low Risk  (06/20/2022)  Physical Activity: Inactive (06/20/2022)  Social Connections: Socially Isolated (06/20/2022)  Stress: No Stress Concern Present (06/20/2022)  Tobacco Use: Medium Risk (05/17/2023)   SDOH Interventions:     Readmission Risk Interventions    09/01/2020   12:30 PM  Readmission Risk Prevention Plan  Transportation Screening Complete  PCP or Specialist Appt within 3-5 Days Complete  HRI or Home Care Consult Complete  Social Work Consult for Recovery Care Planning/Counseling Complete  Palliative Care Screening Not Applicable  Medication Review Oceanographer) Complete

## 2023-05-26 NOTE — Plan of Care (Signed)
  Problem: Fluid Volume: Goal: Ability to maintain a balanced intake and output will improve Outcome: Progressing   Problem: Metabolic: Goal: Ability to maintain appropriate glucose levels will improve Outcome: Progressing   Problem: Nutritional: Goal: Maintenance of adequate nutrition will improve Outcome: Progressing   Problem: Skin Integrity: Goal: Risk for impaired skin integrity will decrease Outcome: Progressing   Problem: Tissue Perfusion: Goal: Adequacy of tissue perfusion will improve Outcome: Progressing   Problem: Safety: Goal: Ability to remain free from injury will improve Outcome: Progressing   Problem: Skin Integrity: Goal: Risk for impaired skin integrity will decrease Outcome: Progressing

## 2023-05-26 NOTE — Discharge Instructions (Signed)
Michelle [name],  You were in the hospital because of breakthrough seizures. Your medications have been adjusted. Please follow-up with your PCP and neurologist.  Per [facility] statutes, patients with seizures are not allowed to drive until  they have been seizure-free for six months. Use caution when using heavy equipment or power tools. Avoid working on ladders or at heights. Take showers instead of baths. Ensure the water temperature is not too high on the home water heater. Do not go swimming alone. When caring for infants or small children, sit down when holding, feeding, or changing them to minimize risk of injury to the child in the event you have a seizure.    Also, Maintain good sleep hygiene. Avoid alcohol.   --> Call 911 and bring the patient back to the ED if:               A.  The seizure lasts longer than 5 minutes.                  B.  The patient doesn't awaken shortly after the seizure             C.  The patient has new problems such as difficulty seeing, speaking or moving             D.  The patient was injured during the seizure             E.  The patient has a temperature over 102 F (39C)             F.  The patient vomited and now is having trouble breathing

## 2023-05-26 NOTE — Progress Notes (Signed)
Report called to receiving facility by Carlynn Herald, RN. Pt informed that transport was on the way to pick her up. All questions answere. Family at the bedside. IV removed. Pt left with [facility].

## 2023-05-29 DIAGNOSIS — E119 Type 2 diabetes mellitus without complications: Secondary | ICD-10-CM | POA: Diagnosis not present

## 2023-05-29 DIAGNOSIS — N9489 Other specified conditions associated with female genital organs and menstrual cycle: Secondary | ICD-10-CM | POA: Diagnosis not present

## 2023-05-29 DIAGNOSIS — I5032 Chronic diastolic (congestive) heart failure: Secondary | ICD-10-CM | POA: Diagnosis not present

## 2023-05-29 DIAGNOSIS — R410 Disorientation, unspecified: Secondary | ICD-10-CM | POA: Diagnosis not present

## 2023-05-29 DIAGNOSIS — I10 Essential (primary) hypertension: Secondary | ICD-10-CM | POA: Diagnosis not present

## 2023-05-29 DIAGNOSIS — G9341 Metabolic encephalopathy: Secondary | ICD-10-CM | POA: Diagnosis not present

## 2023-05-29 DIAGNOSIS — G40909 Epilepsy, unspecified, not intractable, without status epilepticus: Secondary | ICD-10-CM | POA: Diagnosis not present

## 2023-05-30 DIAGNOSIS — F331 Major depressive disorder, recurrent, moderate: Secondary | ICD-10-CM | POA: Diagnosis not present

## 2023-05-30 DIAGNOSIS — R410 Disorientation, unspecified: Secondary | ICD-10-CM | POA: Diagnosis not present

## 2023-05-30 DIAGNOSIS — I5032 Chronic diastolic (congestive) heart failure: Secondary | ICD-10-CM | POA: Diagnosis not present

## 2023-05-30 DIAGNOSIS — M6281 Muscle weakness (generalized): Secondary | ICD-10-CM | POA: Diagnosis not present

## 2023-05-30 DIAGNOSIS — R296 Repeated falls: Secondary | ICD-10-CM | POA: Diagnosis not present

## 2023-05-30 DIAGNOSIS — R278 Other lack of coordination: Secondary | ICD-10-CM | POA: Diagnosis not present

## 2023-05-30 DIAGNOSIS — J449 Chronic obstructive pulmonary disease, unspecified: Secondary | ICD-10-CM | POA: Diagnosis not present

## 2023-05-30 DIAGNOSIS — R569 Unspecified convulsions: Secondary | ICD-10-CM | POA: Diagnosis not present

## 2023-05-31 DIAGNOSIS — N9489 Other specified conditions associated with female genital organs and menstrual cycle: Secondary | ICD-10-CM | POA: Diagnosis not present

## 2023-05-31 DIAGNOSIS — G40909 Epilepsy, unspecified, not intractable, without status epilepticus: Secondary | ICD-10-CM | POA: Diagnosis not present

## 2023-05-31 DIAGNOSIS — I10 Essential (primary) hypertension: Secondary | ICD-10-CM | POA: Diagnosis not present

## 2023-05-31 DIAGNOSIS — I5032 Chronic diastolic (congestive) heart failure: Secondary | ICD-10-CM | POA: Diagnosis not present

## 2023-05-31 DIAGNOSIS — E119 Type 2 diabetes mellitus without complications: Secondary | ICD-10-CM | POA: Diagnosis not present

## 2023-05-31 DIAGNOSIS — G9341 Metabolic encephalopathy: Secondary | ICD-10-CM | POA: Diagnosis not present

## 2023-05-31 DIAGNOSIS — R410 Disorientation, unspecified: Secondary | ICD-10-CM | POA: Diagnosis not present

## 2023-06-02 DIAGNOSIS — M6281 Muscle weakness (generalized): Secondary | ICD-10-CM | POA: Diagnosis not present

## 2023-06-02 DIAGNOSIS — N9489 Other specified conditions associated with female genital organs and menstrual cycle: Secondary | ICD-10-CM | POA: Diagnosis not present

## 2023-06-02 DIAGNOSIS — E119 Type 2 diabetes mellitus without complications: Secondary | ICD-10-CM | POA: Diagnosis not present

## 2023-06-02 DIAGNOSIS — I10 Essential (primary) hypertension: Secondary | ICD-10-CM | POA: Diagnosis not present

## 2023-06-02 DIAGNOSIS — R278 Other lack of coordination: Secondary | ICD-10-CM | POA: Diagnosis not present

## 2023-06-02 DIAGNOSIS — I5032 Chronic diastolic (congestive) heart failure: Secondary | ICD-10-CM | POA: Diagnosis not present

## 2023-06-02 DIAGNOSIS — R410 Disorientation, unspecified: Secondary | ICD-10-CM | POA: Diagnosis not present

## 2023-06-02 DIAGNOSIS — F331 Major depressive disorder, recurrent, moderate: Secondary | ICD-10-CM | POA: Diagnosis not present

## 2023-06-02 DIAGNOSIS — G9341 Metabolic encephalopathy: Secondary | ICD-10-CM | POA: Diagnosis not present

## 2023-06-02 DIAGNOSIS — R569 Unspecified convulsions: Secondary | ICD-10-CM | POA: Diagnosis not present

## 2023-06-02 DIAGNOSIS — J449 Chronic obstructive pulmonary disease, unspecified: Secondary | ICD-10-CM | POA: Diagnosis not present

## 2023-06-02 DIAGNOSIS — G40909 Epilepsy, unspecified, not intractable, without status epilepticus: Secondary | ICD-10-CM | POA: Diagnosis not present

## 2023-06-05 ENCOUNTER — Other Ambulatory Visit: Payer: Self-pay | Admitting: Family Medicine

## 2023-06-05 DIAGNOSIS — G40909 Epilepsy, unspecified, not intractable, without status epilepticus: Secondary | ICD-10-CM | POA: Diagnosis not present

## 2023-06-05 DIAGNOSIS — R410 Disorientation, unspecified: Secondary | ICD-10-CM | POA: Diagnosis not present

## 2023-06-05 DIAGNOSIS — E119 Type 2 diabetes mellitus without complications: Secondary | ICD-10-CM | POA: Diagnosis not present

## 2023-06-05 DIAGNOSIS — I5032 Chronic diastolic (congestive) heart failure: Secondary | ICD-10-CM | POA: Diagnosis not present

## 2023-06-05 DIAGNOSIS — I10 Essential (primary) hypertension: Secondary | ICD-10-CM | POA: Diagnosis not present

## 2023-06-05 DIAGNOSIS — N9489 Other specified conditions associated with female genital organs and menstrual cycle: Secondary | ICD-10-CM | POA: Diagnosis not present

## 2023-06-05 DIAGNOSIS — G9341 Metabolic encephalopathy: Secondary | ICD-10-CM | POA: Diagnosis not present

## 2023-06-06 DIAGNOSIS — M6281 Muscle weakness (generalized): Secondary | ICD-10-CM | POA: Diagnosis not present

## 2023-06-06 DIAGNOSIS — I5032 Chronic diastolic (congestive) heart failure: Secondary | ICD-10-CM | POA: Diagnosis not present

## 2023-06-06 DIAGNOSIS — I10 Essential (primary) hypertension: Secondary | ICD-10-CM | POA: Diagnosis not present

## 2023-06-06 DIAGNOSIS — G9341 Metabolic encephalopathy: Secondary | ICD-10-CM | POA: Diagnosis not present

## 2023-06-06 DIAGNOSIS — F331 Major depressive disorder, recurrent, moderate: Secondary | ICD-10-CM | POA: Diagnosis not present

## 2023-06-06 DIAGNOSIS — R410 Disorientation, unspecified: Secondary | ICD-10-CM | POA: Diagnosis not present

## 2023-06-06 DIAGNOSIS — R278 Other lack of coordination: Secondary | ICD-10-CM | POA: Diagnosis not present

## 2023-06-06 DIAGNOSIS — J449 Chronic obstructive pulmonary disease, unspecified: Secondary | ICD-10-CM | POA: Diagnosis not present

## 2023-06-06 DIAGNOSIS — N9489 Other specified conditions associated with female genital organs and menstrual cycle: Secondary | ICD-10-CM | POA: Diagnosis not present

## 2023-06-06 DIAGNOSIS — G40909 Epilepsy, unspecified, not intractable, without status epilepticus: Secondary | ICD-10-CM | POA: Diagnosis not present

## 2023-06-06 DIAGNOSIS — R569 Unspecified convulsions: Secondary | ICD-10-CM | POA: Diagnosis not present

## 2023-06-06 DIAGNOSIS — E119 Type 2 diabetes mellitus without complications: Secondary | ICD-10-CM | POA: Diagnosis not present

## 2023-06-07 DIAGNOSIS — G9341 Metabolic encephalopathy: Secondary | ICD-10-CM | POA: Diagnosis not present

## 2023-06-07 DIAGNOSIS — R410 Disorientation, unspecified: Secondary | ICD-10-CM | POA: Diagnosis not present

## 2023-06-07 DIAGNOSIS — I10 Essential (primary) hypertension: Secondary | ICD-10-CM | POA: Diagnosis not present

## 2023-06-07 DIAGNOSIS — E119 Type 2 diabetes mellitus without complications: Secondary | ICD-10-CM | POA: Diagnosis not present

## 2023-06-07 DIAGNOSIS — G40909 Epilepsy, unspecified, not intractable, without status epilepticus: Secondary | ICD-10-CM | POA: Diagnosis not present

## 2023-06-07 DIAGNOSIS — N9489 Other specified conditions associated with female genital organs and menstrual cycle: Secondary | ICD-10-CM | POA: Diagnosis not present

## 2023-06-07 DIAGNOSIS — I5032 Chronic diastolic (congestive) heart failure: Secondary | ICD-10-CM | POA: Diagnosis not present

## 2023-06-09 DIAGNOSIS — G40909 Epilepsy, unspecified, not intractable, without status epilepticus: Secondary | ICD-10-CM | POA: Diagnosis not present

## 2023-06-09 DIAGNOSIS — I10 Essential (primary) hypertension: Secondary | ICD-10-CM | POA: Diagnosis not present

## 2023-06-09 DIAGNOSIS — E119 Type 2 diabetes mellitus without complications: Secondary | ICD-10-CM | POA: Diagnosis not present

## 2023-06-09 DIAGNOSIS — M6281 Muscle weakness (generalized): Secondary | ICD-10-CM | POA: Diagnosis not present

## 2023-06-09 DIAGNOSIS — F331 Major depressive disorder, recurrent, moderate: Secondary | ICD-10-CM | POA: Diagnosis not present

## 2023-06-09 DIAGNOSIS — G9341 Metabolic encephalopathy: Secondary | ICD-10-CM | POA: Diagnosis not present

## 2023-06-09 DIAGNOSIS — R410 Disorientation, unspecified: Secondary | ICD-10-CM | POA: Diagnosis not present

## 2023-06-09 DIAGNOSIS — J449 Chronic obstructive pulmonary disease, unspecified: Secondary | ICD-10-CM | POA: Diagnosis not present

## 2023-06-09 DIAGNOSIS — R569 Unspecified convulsions: Secondary | ICD-10-CM | POA: Diagnosis not present

## 2023-06-09 DIAGNOSIS — I5032 Chronic diastolic (congestive) heart failure: Secondary | ICD-10-CM | POA: Diagnosis not present

## 2023-06-09 DIAGNOSIS — N9489 Other specified conditions associated with female genital organs and menstrual cycle: Secondary | ICD-10-CM | POA: Diagnosis not present

## 2023-06-09 DIAGNOSIS — R278 Other lack of coordination: Secondary | ICD-10-CM | POA: Diagnosis not present

## 2023-06-12 ENCOUNTER — Ambulatory Visit (INDEPENDENT_AMBULATORY_CARE_PROVIDER_SITE_OTHER): Payer: Medicare HMO | Admitting: Family Medicine

## 2023-06-12 ENCOUNTER — Encounter: Payer: Self-pay | Admitting: Family Medicine

## 2023-06-12 VITALS — BP 130/60 | HR 70 | Temp 97.4°F | Ht 65.0 in | Wt 168.8 lb

## 2023-06-12 DIAGNOSIS — I2510 Atherosclerotic heart disease of native coronary artery without angina pectoris: Secondary | ICD-10-CM | POA: Diagnosis not present

## 2023-06-12 DIAGNOSIS — E119 Type 2 diabetes mellitus without complications: Secondary | ICD-10-CM

## 2023-06-12 DIAGNOSIS — G40909 Epilepsy, unspecified, not intractable, without status epilepticus: Secondary | ICD-10-CM

## 2023-06-12 DIAGNOSIS — I10 Essential (primary) hypertension: Secondary | ICD-10-CM

## 2023-06-12 DIAGNOSIS — Z794 Long term (current) use of insulin: Secondary | ICD-10-CM | POA: Diagnosis not present

## 2023-06-12 DIAGNOSIS — R569 Unspecified convulsions: Secondary | ICD-10-CM

## 2023-06-12 DIAGNOSIS — I5032 Chronic diastolic (congestive) heart failure: Secondary | ICD-10-CM

## 2023-06-12 LAB — COMPREHENSIVE METABOLIC PANEL
ALT: 11 U/L (ref 0–35)
AST: 14 U/L (ref 0–37)
Albumin: 3.7 g/dL (ref 3.5–5.2)
Alkaline Phosphatase: 44 U/L (ref 39–117)
BUN: 27 mg/dL — ABNORMAL HIGH (ref 6–23)
CO2: 26 meq/L (ref 19–32)
Calcium: 9.4 mg/dL (ref 8.4–10.5)
Chloride: 102 meq/L (ref 96–112)
Creatinine, Ser: 0.99 mg/dL (ref 0.40–1.20)
GFR: 50.93 mL/min — ABNORMAL LOW (ref >60.00)
Glucose, Bld: 118 mg/dL — ABNORMAL HIGH (ref 70–99)
Potassium: 3.8 meq/L (ref 3.5–5.1)
Sodium: 136 meq/L (ref 135–145)
Total Bilirubin: 0.3 mg/dL (ref 0.2–1.2)
Total Protein: 6.7 g/dL (ref 6.0–8.3)

## 2023-06-12 LAB — CBC WITH DIFFERENTIAL/PLATELET
Basophils Absolute: 0.1 10*3/uL (ref 0.0–0.1)
Basophils Relative: 0.7 % (ref 0.0–3.0)
Eosinophils Absolute: 0.2 10*3/uL (ref 0.0–0.7)
Eosinophils Relative: 1.9 % (ref 0.0–5.0)
HCT: 34.7 % — ABNORMAL LOW (ref 36.0–46.0)
Hemoglobin: 11.2 g/dL — ABNORMAL LOW (ref 12.0–15.0)
Lymphocytes Relative: 17.9 % (ref 12.0–46.0)
Lymphs Abs: 1.5 10*3/uL (ref 0.7–4.0)
MCHC: 32.4 g/dL (ref 30.0–36.0)
MCV: 86.4 fL (ref 78.0–100.0)
Monocytes Absolute: 0.7 10*3/uL (ref 0.1–1.0)
Monocytes Relative: 7.9 % (ref 3.0–12.0)
Neutro Abs: 6 10*3/uL (ref 1.4–7.7)
Neutrophils Relative %: 71.6 % (ref 43.0–77.0)
Platelets: 221 10*3/uL (ref 150.0–400.0)
RBC: 4.02 Mil/uL (ref 3.87–5.11)
RDW: 15.6 % — ABNORMAL HIGH (ref 11.5–15.5)
WBC: 8.4 10*3/uL (ref 4.0–10.5)

## 2023-06-12 LAB — HEMOGLOBIN A1C: Hgb A1c MFr Bld: 6.4 % (ref 4.6–6.5)

## 2023-06-12 MED ORDER — VALTOCO 15 MG DOSE 7.5 MG/0.1ML NA LQPK: 15.0000 mg | NASAL | 0 refills | Status: DC | PRN

## 2023-06-12 NOTE — Assessment & Plan Note (Deleted)
S: Patient was admitted to the hospital from 05/17/2023 to 05/26/2023 after presenting to the hospital with seizure activity.  Patient's Lamictal was increased.  She was also given diazepam intranasal as needed for seizures.  Previously in August she had a fall due to seizure. Was in rehab at [facility] from day of discharge until 06/11/23- worked with physical therapy - she increased in strength while there- still doesn't feel super steady on her feet- but they think a lot of that is knee pain related and has upcoming injections with Dr. Denyse Amass planned - Chronic medical conditions were largely stable during hospitalization other than had to blood pressure medicines removed as below.  She did have 1 episode of chest pain during the admission which resolved-echocardiogram showed worsening cardiomyopathy but troponin levels did not elevate thankfully.  Did have some vaginal bleeding due to known endometrial mass-medroxyprogesterone was restarted and symptoms resolved  Today family reports no seizures at rehab.  - was discharged on Lamictal 25 mg in AM and 50 mg in PM. That is what they are planning on giving at home though discharge paperwork showed 100 mg from rehab so did receive one 100 mg dose last nght (rehab was actually giving 50 mg as taking half of 100 mg tablet but was not clear to family)- felt ok this morning -was also started on seroquel 25 mg at night. Failed 12.5 mg previously. They have to give it 7 pm so she doesn't sleep utnil 1 pm next day.  A/P: Patient seems to have done incredibly well despite recent hospitalization.  She did have some deconditioning but this has improved with physical therapy.  Her fall risk is primarily related to the knee pain it appears - Seizures have stabilized without recurrence on Lamictal 25 mg in the morning and 50 mg in the evening-continue current medication and I can write the neck prescription-they will reach out when they are in need -Keep follow-up with  neurology in February

## 2023-06-12 NOTE — Assessment & Plan Note (Signed)
#  CHF  #hypertension-works with resistant hypertension clinic and deals with variability S: medication: carvedilol 12.5 mg twice daily, enalapril 20 mg twice daily, and amlodipine 2.5 mg along with Furosemide 20 mg daily now just as needed prior scheduled -Previously on hydralazine 50 mg 3 times a day (per cardiology can hold midday dose if blood pressure less than 110),  spironolactone 25 mg daily-discontinued with recent hospitalization Edema-denies, weight-no reported increase, shortness of breath-no reported increase, BP Readings from Last 3 Encounters:  06/12/23 130/60  05/26/23 (!) 150/62  04/13/23 129/70   A/P: Congestive heart failure appears euvolemic-continue current medication Hypertension-at goal despite stopping spironolactone and hydralazine-continue carvedilol, enalapril and amlodipine

## 2023-06-12 NOTE — Progress Notes (Signed)
Phone [phone] In person visit   Subjective:   Michelle [name] is a 89 y.o. year old very pleasant female patient who presents for/with See problem oriented charting Chief Complaint  Patient presents with   Follow-up    Pt is here for follow up on rehab    Past Medical History-  Patient Active Problem List   Diagnosis Date Noted   Wheelchair dependence 05/17/2023    Priority: High   DNR (do not resuscitate)/DNI(Do Not Intubate) 05/17/2023    Priority: High   Memory impairment 09/09/2022    Priority: High   Epilepsy (HCC) 03/12/2021    Priority: High   Dementia (HCC) 10/22/2020    Priority: High   Chronic diastolic CHF (congestive heart failure) (HCC) 03/13/2014    Priority: High   CAD- RCA PCI '90s, STEMI-RCA DES 02/18/14 03/05/2014    Priority: High   Type 2 diabetes mellitus without complication, with long-term current use of insulin (HCC) 03/26/2007    Priority: High   Anemia, iron deficiency 10/23/2015    Priority: Medium    CKD (chronic kidney disease), stage III (HCC) 07/25/2014    Priority: Medium    Osteoarthritis, knee 05/13/2014    Priority: Medium    Anxiety state 04/21/2014    Priority: Medium    SOB (shortness of breath) 03/05/2014    Priority: Medium    Hyperlipidemia associated with type 2 diabetes mellitus (HCC) 03/26/2007    Priority: Medium    Essential hypertension 04/21/2006    Priority: Medium    COPD (chronic obstructive pulmonary disease) (HCC) 04/21/2006    Priority: Medium    Actinic keratosis 11/17/2017    Priority: Low   Back pain, lumbosacral 10/10/2012    Priority: Low   Breakthrough seizure (HCC) 05/17/2023   Chronic pain of both knees 04/05/2022   Adnexal mass 11/24/2020   Thickened endometrium 11/24/2020    Medications- reviewed and updated Current Outpatient Medications  Medication Sig Dispense Refill   acetaminophen (TYLENOL) 650 MG CR tablet Take 1,300 mg by mouth 3 (three) times daily.     albuterol (PROVENTIL) (2.5  MG/3ML) 0.083% nebulizer solution USE THREE MILLILITERS VIA NEBULIZATION BY MOUTH EVERY 6 HOURS AS NEEDED FOR WHEEZING OR SHORTNESS OF BREATH (Patient taking differently: Take 2.5 mg by nebulization every 6 (six) hours as needed for wheezing or shortness of breath.) 75 mL 3   albuterol (VENTOLIN HFA) 108 (90 Base) MCG/ACT inhaler INHALE 1 PUFF EVERY 4 HOURS AS NEEDED FOR WHEEZING, SHORTNESS OF BREATH (RESCUE INHALER IF ADVAIR NOT WORKING) (Patient taking differently: Inhale 2 puffs into the lungs as needed.) 2 each 2   amLODipine (NORVASC) 2.5 MG tablet TAKE 1 TABLET EVERY MORNING. IF AFTERNOON BLOOD PRESSURE IS OVER 145/90 TAKE SECOND TABLET. 180 tablet 3   aspirin 81 MG chewable tablet Chew 81 mg by mouth daily.     blood glucose meter kit and supplies KIT Dispense based on patient and insurance preference. Use up to four times daily as directed. Dx:E11.9 1 each 3   calcium carbonate (OSCAL) 1500 (600 Ca) MG TABS tablet Take 600 mg of elemental calcium by mouth daily.     carvedilol (COREG) 12.5 MG tablet Take 1 tablet (12.5 mg total) by mouth 2 (two) times daily. 180 tablet 3   cholecalciferol (VITAMIN D3) 25 MCG (1000 UNIT) tablet Take 1,000 Units by mouth daily with breakfast.     clopidogrel (PLAVIX) 75 MG tablet TAKE 1 TABLET EVERY DAY (Patient taking differently: Take 75 mg  by mouth daily.) 90 tablet 3   diclofenac Sodium (VOLTAREN) 1 % GEL Apply 2 g topically 4 (four) times daily. 300 g 3   enalapril (VASOTEC) 20 MG tablet Take 1 tablet (20 mg total) by mouth at bedtime. (Patient taking differently: Take 20 mg by mouth at bedtime.) 90 tablet 3   fenofibrate micronized (LOFIBRA) 134 MG capsule TAKE 1 CAPSULE EVERY DAY 90 capsule 3   ferrous sulfate 325 (65 FE) MG tablet Twice a week (Patient taking differently: Take 325 mg by mouth 2 (two) times a week. Tuesday  and Friday) 18 tablet 3   fluticasone-salmeterol (ADVAIR) 250-50 MCG/ACT AEPB INHALE 1 PUFF TWICE DAILY 180 each 3   folic acid  (FOLVITE) 400 MCG tablet Take 400 mcg by mouth at bedtime.     furosemide (LASIX) 20 MG tablet Take 1 tablet (20 mg total) by mouth as needed for fluid or edema. 90 tablet 2   icosapent Ethyl (VASCEPA) 1 g capsule Take 2 capsules (2 g total) by mouth 2 (two) times daily. (Patient taking differently: Take 1-2 g by mouth See admin instructions. Take 1 g gram in the morning and afternoon. Take 2 g at bedtime) 360 capsule 3   lamoTRIgine (LAMICTAL) 25 MG tablet Take 1 tablet (25 mg total) by mouth in the morning AND 2 tablets (50 mg total) at bedtime.     Lancets Misc. (ACCU-CHEK FASTCLIX LANCET) KIT Use as directed to test blood sugar 1 kit 1   loperamide (IMODIUM A-D) 2 MG tablet Take 2-4 mg by mouth 2 (two) times daily as needed for diarrhea or loose stools.     loratadine (CLARITIN) 10 MG tablet Take 10 mg by mouth every other day.     medroxyPROGESTERone (PROVERA) 10 MG tablet Take 1 tablet (10 mg total) by mouth daily. (Patient taking differently: Take 10 mg by mouth at bedtime.) 30 tablet 3   nitroGLYCERIN (NITROSTAT) 0.4 MG SL tablet DISSOLVE 1 TAB UNDER TONGUE FOR CHEST PAIN - IF PAIN REMAINS AFTER 5 MIN, CALL 911 AND REPEAT DOSE. MAX 3 TABS IN 15 MINUTES (Patient taking differently: Place 0.4 mg under the tongue every 5 (five) minutes as needed for chest pain. DISSOLVE 1 TAB UNDER TONGUE FOR CHEST PAIN - IF PAIN REMAINS AFTER 5 MIN, CALL 911 AND REPEAT DOSE. MAX 3 TABS IN 15 MINUTES) 25 tablet 6   pantoprazole (PROTONIX) 40 MG tablet TAKE 1 TABLET EVERY DAY 90 tablet 3   psyllium (METAMUCIL) 58.6 % packet Take 1 packet by mouth as needed (fiber).     QUEtiapine (SEROQUEL) 25 MG tablet Take 1 tablet (25 mg total) by mouth every evening. Between 1830 and 1900     rosuvastatin (CRESTOR) 40 MG tablet Take 1 tablet (40 mg total) by mouth daily. 90 tablet 3   sertraline (ZOLOFT) 50 MG tablet TAKE 2 TABLETS AT BEDTIME (Patient taking differently: Take 100 mg by mouth at bedtime.) 180 tablet 3   SPIRIVA  HANDIHALER 18 MCG inhalation capsule PLACE 1 CAPSULE INTO HANDIHALER AND INHALE THE CONTENTS DAILY (Patient taking differently: Place 18 mcg into inhaler and inhale daily.) 90 capsule 3   TRUE METRIX BLOOD GLUCOSE TEST test strip TEST BLOOD SUGAR UP TO FOUR TIMES DAILY AS DIRECTED 400 strip 3   TRUEplus Lancets 33G MISC TEST BLOOD SUGAR UP TO FOUR TIMES DAILY AS DIRECTED 400 each 3   vitamin B-12 (CYANOCOBALAMIN) 1000 MCG tablet Take 1,000 mcg by mouth daily with breakfast.  vitamin E 400 UNIT capsule Take 400 Units by mouth at bedtime.     diazePAM, 15 MG Dose, (VALTOCO 15 MG DOSE) 2 x 7.5 MG/0.1ML LQPK Place 15 mg into the nose as needed (seizure lasting more than 2 minutes). 10 each 0   No current facility-administered medications for this visit.     Objective:  BP 130/60   Pulse 70   Temp (!) 97.4 F (36.3 C)   Ht 5\' 5"  (1.651 m)   Wt 168 lb 12.8 oz (76.6 kg)   SpO2 97%   BMI 28.09 kg/m  Gen: NAD, resting comfortably CV: RRR no murmurs rubs or gallops Lungs: CTAB no crackles, wheeze, rhonchi Ext: trace edema Skin: warm, dry Neuro: in wheelchair    Assessment and Plan   # Epilepsy with breakthrough seizures S: Patient was admitted to the hospital from 05/17/2023 to 05/26/2023 after presenting to the hospital with seizure activity.  Patient's Lamictal was increased.  She was also given diazepam intranasal as needed for seizures.  Previously in August she had a fall due to seizure. Was in rehab at [facility] from day of discharge until 06/11/23- worked with physical therapy - she increased in strength while there- still doesn't feel super steady on her feet- but they think a lot of that is knee pain related and has upcoming injections with Dr. Denyse Amass planned - Chronic medical conditions were largely stable during hospitalization other than had to blood pressure medicines removed as below.  She did have 1 episode of chest pain during the admission which resolved-echocardiogram  showed worsening cardiomyopathy but troponin levels did not elevate thankfully.  Did have some vaginal bleeding due to known endometrial mass-medroxyprogesterone was restarted and symptoms resolved  Today family reports no seizures at rehab.  - was discharged on Lamictal 25 mg in AM and 50 mg in PM. That is what they are planning on giving at home though discharge paperwork showed 100 mg from rehab so did receive one 100 mg dose last nght (rehab was actually giving 50 mg as taking half of 100 mg tablet but was not clear to family)- felt ok this morning -was also started on seroquel 25 mg at night. Failed 12.5 mg previously. They have to give it 7 pm so she doesn't sleep utnil 1 pm next day.  A/P: Patient seems to have done incredibly well despite recent hospitalization.  She did have some deconditioning but this has improved with physical therapy.  Her fall risk is primarily related to the knee pain it appears - Epilepsy with breakthrough seizures have stabilized without recurrence on Lamictal 25 mg in the morning and 50 mg in the evening-continue current medication and I can write the neck prescription-they will reach out when they are in need  -Keep follow-up with neurology in February with Dr. Karel Jarvis  -We will send in intranasal diazepam but not sure it will be covered/cost effective  # Adnexal mass-following with Dr. Pricilla Holm of gynecologic-plan was for endometrial biopsy and ultrasound-on ultrasound 20 mm endometrial thickening and sampling recommended due to postmenopausal bleeding (thankfully only benign polyp).  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  Thankfully appears stable  #CAD  #hyperlipidemia S: Medication:Aspirin 81 mg, Plavix 75 mg, fenofibrate 134 mg, Vascepa 1 g, rosuvastatin  40 mg -Did have chest pain  episode in the hospital and required nitroglycerin but no recurrent issues  Lab Results  Component Value Date   CHOL 122 08/19/2022   HDL 53.40 08/19/2022   LDLCALC 32 08/19/2022   LDLDIRECT 44.0 07/08/2021   TRIG 290 (H) 02/16/2023   CHOLHDL 2 08/19/2022    A/P: CAD asymptomatic outside of the hospital-continue current medication -Lipids have been well-controlled-continue current medication  #CHF  #hypertension-works with resistant hypertension clinic and deals with variability S: medication: carvedilol 12.5 mg twice daily, enalapril 20 mg twice daily, and amlodipine 2.5 mg along with Furosemide 20 mg daily now just as needed prior scheduled -Previously on hydralazine 50 mg 3 times a day (per cardiology can hold midday dose if blood pressure less than 110),  spironolactone 25 mg daily-discontinued with recent hospitalization Edema-denies, weight-no reported increase, shortness of breath-no reported increase, BP Readings from Last 3 Encounters:  06/12/23 130/60  05/26/23 (!) 150/62  04/13/23 129/70   A/P: Congestive heart failure appears euvolemic-continue current medication-very sparingly needing the Lasix at this point thankfully Hypertension-at goal despite stopping spironolactone and hydralazine-continue carvedilol, enalapril and amlodipine  # Diabetes S: Medication: Lantus 20 to 22 units each morning -Off metformin since hospitalization CBGs- 120s or 130s in the morning A/P: Doing well off metformin and on insulin long-acting alone-continue current medication    # COPD  S: Patient reports stable shortness of breath and cough  Maintenance medications: Advair 250-50 mcg 1 puff twice daily, spiriva.  Albuterol available A/P: Doing well at present-continue current medication  #GERD-reports controlled on pantoprazole 40 mg daily  #depressed mood- on sertraline 100 mg (two of the 50 mg for swallowing purposes) -Seroquel was added for sleep at 25 mg  and has been helpful  Recommended follow up: Return in about 14 weeks (around 09/18/2023) for followup or sooner if needed.Schedule b4 you leave. Future Appointments  Date Time Provider Department Center  06/19/2023  2:15 PM Rodolph Bong, MD [facility] None  06/26/2023  2:30 PM [facility] ANNUAL WELLNESS VISIT 1 [facility]  07/20/2023  8:25 AM Alver Sorrow, NP [facility]  09/11/2023  2:30 PM Van Clines, MD [facility] None  09/18/2023  2:20 PM Shelva Majestic, MD [facility] PEC  10/04/2023  1:00 PM Rennis Chris, MD [facility] None    Lab/Order associations:   ICD-10-CM   1. Type 2 diabetes mellitus without complication, with long-term current use of insulin (HCC)  E11.9 Hemoglobin A1c   Z79.4 Comprehensive metabolic panel    CBC with Differential/Platelet    2. Seizure (HCC)  R56.9     3. Essential hypertension  I10     4. Coronary artery disease involving native coronary artery of native heart without angina pectoris  I25.10     5. Chronic diastolic CHF (congestive heart failure) (HCC)  I50.32     6. Nonintractable epilepsy without status epilepticus, unspecified epilepsy type (HCC)  G40.909       Meds ordered this encounter  Medications   diazePAM, 15 MG Dose, (VALTOCO 15 MG DOSE) 2 x 7.5 MG/0.1ML LQPK    Sig: Place 15 mg into the nose as needed (seizure lasting more than 2 minutes).    Dispense:  10 each    Refill:  0    5 boxes per Md.mll (10 inhalers). Don't fill if costly    Time Spent: 42 minutes of total time (1:47 PM- 2:29 PM) was spent on the date of the encounter performing the following actions: chart review prior to seeing  the patient, obtaining history, performing a medically necessary exam, counseling on the treatment plan and questions on transition from rehab back to home including extensive medication reconciliation, placing orders, and documenting in our EHR.    Return precautions advised.  Tana Conch, MD

## 2023-06-12 NOTE — Assessment & Plan Note (Addendum)
S: Patient was admitted to the hospital from 05/17/2023 to 05/26/2023 after presenting to the hospital with seizure activity.  Patient's Lamictal was increased.  She was also given diazepam intranasal as needed for seizures.  Previously in August she had a fall due to seizure. Was in rehab at [facility] from day of discharge until 06/11/23- worked with physical therapy - she increased in strength while there- still doesn't feel super steady on her feet- but they think a lot of that is knee pain related and has upcoming injections with Dr. Denyse Amass planned - Chronic medical conditions were largely stable during hospitalization other than had to blood pressure medicines removed as below.  She did have 1 episode of chest pain during the admission which resolved-echocardiogram showed worsening cardiomyopathy but troponin levels did not elevate thankfully.  Did have some vaginal bleeding due to known endometrial mass-medroxyprogesterone was restarted and symptoms resolved  Today family reports no seizures at rehab.  - was discharged on Lamictal 25 mg in AM and 50 mg in PM. That is what they are planning on giving at home though discharge paperwork showed 100 mg from rehab so did receive one 100 mg dose last nght (rehab was actually giving 50 mg as taking half of 100 mg tablet but was not clear to family)- felt ok this morning -was also started on seroquel 25 mg at night. Failed 12.5 mg previously. They have to give it 7 pm so she doesn't sleep utnil 1 pm next day.  A/P: Patient seems to have done incredibly well despite recent hospitalization.  She did have some deconditioning but this has improved with physical therapy.  Her fall risk is primarily related to the knee pain it appears - Seizures have stabilized without recurrence on Lamictal 25 mg in the morning and 50 mg in the evening-continue current medication and I can write the neck prescription-they will reach out when they are in need -Keep follow-up with  neurology in February -We will send in intranasal diazepam but not sure it will be covered/cost effective

## 2023-06-12 NOTE — Patient Instructions (Addendum)
  Please stop by lab before you go If you have mychart- we will send your results within 3 business days of [place] receiving them.  If you do not have mychart- we will call you about results within 5 business days of [place] receiving them.  *please also note that you will see labs on mychart as soon as they post. I will later go in and write notes on them- will say "notes from Dr. Durene Cal"   Thrilled you are doing so well!   Stay off metformin, spironolactone, and hydralazine - let me know if sugars trend up and we can potentially restart metformin  Recommended follow up: No follow-ups on file.Please stop by lab before you go If you have mychart- we will send your results within 3 business days of [place] receiving them.  If you do not have mychart- we will call you about results within 5 business days of [place] receiving them.  *please also note that you will see labs on mychart as soon as they post. I will later go in and write notes on them- will say "notes from Dr. Durene Cal"

## 2023-06-14 ENCOUNTER — Emergency Department (HOSPITAL_COMMUNITY): Payer: Medicare HMO

## 2023-06-14 ENCOUNTER — Encounter (HOSPITAL_COMMUNITY): Payer: Self-pay

## 2023-06-14 ENCOUNTER — Encounter: Payer: Self-pay | Admitting: Family Medicine

## 2023-06-14 ENCOUNTER — Other Ambulatory Visit: Payer: Self-pay

## 2023-06-14 ENCOUNTER — Inpatient Hospital Stay (HOSPITAL_COMMUNITY)
Admission: EM | Admit: 2023-06-14 | Discharge: 2023-06-18 | DRG: 100 | Disposition: A | Discharge status: Home Health | Payer: Medicare HMO | Attending: Internal Medicine | Admitting: Internal Medicine

## 2023-06-14 DIAGNOSIS — Z833 Family history of diabetes mellitus: Secondary | ICD-10-CM

## 2023-06-14 DIAGNOSIS — R9082 White matter disease, unspecified: Secondary | ICD-10-CM | POA: Diagnosis not present

## 2023-06-14 DIAGNOSIS — F0390 Unspecified dementia without behavioral disturbance: Secondary | ICD-10-CM | POA: Diagnosis not present

## 2023-06-14 DIAGNOSIS — E876 Hypokalemia: Secondary | ICD-10-CM | POA: Diagnosis present

## 2023-06-14 DIAGNOSIS — Z87891 Personal history of nicotine dependence: Secondary | ICD-10-CM | POA: Diagnosis not present

## 2023-06-14 DIAGNOSIS — Z9049 Acquired absence of other specified parts of digestive tract: Secondary | ICD-10-CM | POA: Diagnosis not present

## 2023-06-14 DIAGNOSIS — Z79899 Other long term (current) drug therapy: Secondary | ICD-10-CM

## 2023-06-14 DIAGNOSIS — Z6827 Body mass index (BMI) 27.0-27.9, adult: Secondary | ICD-10-CM

## 2023-06-14 DIAGNOSIS — R414 Neurologic neglect syndrome: Secondary | ICD-10-CM | POA: Diagnosis present

## 2023-06-14 DIAGNOSIS — F05 Delirium due to known physiological condition: Secondary | ICD-10-CM | POA: Diagnosis not present

## 2023-06-14 DIAGNOSIS — Z794 Long term (current) use of insulin: Secondary | ICD-10-CM | POA: Diagnosis not present

## 2023-06-14 DIAGNOSIS — Z9841 Cataract extraction status, right eye: Secondary | ICD-10-CM

## 2023-06-14 DIAGNOSIS — Z66 Do not resuscitate: Secondary | ICD-10-CM | POA: Diagnosis present

## 2023-06-14 DIAGNOSIS — I672 Cerebral atherosclerosis: Secondary | ICD-10-CM | POA: Diagnosis not present

## 2023-06-14 DIAGNOSIS — Z7982 Long term (current) use of aspirin: Secondary | ICD-10-CM

## 2023-06-14 DIAGNOSIS — R531 Weakness: Secondary | ICD-10-CM | POA: Diagnosis not present

## 2023-06-14 DIAGNOSIS — E119 Type 2 diabetes mellitus without complications: Secondary | ICD-10-CM | POA: Diagnosis present

## 2023-06-14 DIAGNOSIS — G40A09 Absence epileptic syndrome, not intractable, without status epilepticus: Principal | ICD-10-CM | POA: Diagnosis present

## 2023-06-14 DIAGNOSIS — R4182 Altered mental status, unspecified: Secondary | ICD-10-CM | POA: Diagnosis not present

## 2023-06-14 DIAGNOSIS — I2510 Atherosclerotic heart disease of native coronary artery without angina pectoris: Secondary | ICD-10-CM | POA: Diagnosis present

## 2023-06-14 DIAGNOSIS — Z955 Presence of coronary angioplasty implant and graft: Secondary | ICD-10-CM

## 2023-06-14 DIAGNOSIS — J449 Chronic obstructive pulmonary disease, unspecified: Secondary | ICD-10-CM | POA: Diagnosis present

## 2023-06-14 DIAGNOSIS — R569 Unspecified convulsions: Secondary | ICD-10-CM | POA: Diagnosis not present

## 2023-06-14 DIAGNOSIS — Z993 Dependence on wheelchair: Secondary | ICD-10-CM

## 2023-06-14 DIAGNOSIS — G9341 Metabolic encephalopathy: Secondary | ICD-10-CM | POA: Diagnosis not present

## 2023-06-14 DIAGNOSIS — Z9842 Cataract extraction status, left eye: Secondary | ICD-10-CM

## 2023-06-14 DIAGNOSIS — E663 Overweight: Secondary | ICD-10-CM | POA: Diagnosis not present

## 2023-06-14 DIAGNOSIS — I252 Old myocardial infarction: Secondary | ICD-10-CM

## 2023-06-14 DIAGNOSIS — E875 Hyperkalemia: Secondary | ICD-10-CM | POA: Diagnosis not present

## 2023-06-14 DIAGNOSIS — G928 Other toxic encephalopathy: Secondary | ICD-10-CM

## 2023-06-14 DIAGNOSIS — D649 Anemia, unspecified: Secondary | ICD-10-CM | POA: Diagnosis not present

## 2023-06-14 DIAGNOSIS — E785 Hyperlipidemia, unspecified: Secondary | ICD-10-CM | POA: Diagnosis present

## 2023-06-14 DIAGNOSIS — Z800 Family history of malignant neoplasm of digestive organs: Secondary | ICD-10-CM

## 2023-06-14 DIAGNOSIS — I10 Essential (primary) hypertension: Secondary | ICD-10-CM | POA: Diagnosis not present

## 2023-06-14 DIAGNOSIS — R059 Cough, unspecified: Secondary | ICD-10-CM | POA: Diagnosis not present

## 2023-06-14 DIAGNOSIS — Z7951 Long term (current) use of inhaled steroids: Secondary | ICD-10-CM

## 2023-06-14 DIAGNOSIS — J9811 Atelectasis: Secondary | ICD-10-CM | POA: Diagnosis not present

## 2023-06-14 DIAGNOSIS — Z8249 Family history of ischemic heart disease and other diseases of the circulatory system: Secondary | ICD-10-CM

## 2023-06-14 DIAGNOSIS — I700 Atherosclerosis of aorta: Secondary | ICD-10-CM | POA: Diagnosis not present

## 2023-06-14 DIAGNOSIS — Z7984 Long term (current) use of oral hypoglycemic drugs: Secondary | ICD-10-CM

## 2023-06-14 DIAGNOSIS — I6523 Occlusion and stenosis of bilateral carotid arteries: Secondary | ICD-10-CM | POA: Diagnosis not present

## 2023-06-14 DIAGNOSIS — Z7902 Long term (current) use of antithrombotics/antiplatelets: Secondary | ICD-10-CM

## 2023-06-14 LAB — I-STAT CHEM 8, ED
BUN: 38 mg/dL — ABNORMAL HIGH (ref 8–23)
Calcium, Ion: 1.04 mmol/L — ABNORMAL LOW (ref 1.15–1.40)
Chloride: 108 mmol/L (ref 98–111)
Creatinine, Ser: 0.8 mg/dL (ref 0.44–1.00)
Glucose, Bld: 126 mg/dL — ABNORMAL HIGH (ref 70–99)
HCT: 41 % (ref 36.0–46.0)
Hemoglobin: 13.9 g/dL (ref 12.0–15.0)
Potassium: 5.8 mmol/L — ABNORMAL HIGH (ref 3.5–5.1)
Sodium: 136 mmol/L (ref 135–145)
TCO2: 22 mmol/L (ref 22–32)

## 2023-06-14 LAB — CBC WITH DIFFERENTIAL/PLATELET
Abs Immature Granulocytes: 0.03 10*3/uL (ref 0.00–0.07)
Basophils Absolute: 0 10*3/uL (ref 0.0–0.1)
Basophils Relative: 1 %
Eosinophils Absolute: 0.1 10*3/uL (ref 0.0–0.5)
Eosinophils Relative: 1 %
HCT: 41.7 % (ref 36.0–46.0)
Hemoglobin: 12.9 g/dL (ref 12.0–15.0)
Immature Granulocytes: 0 %
Lymphocytes Relative: 19 %
Lymphs Abs: 1.5 10*3/uL (ref 0.7–4.0)
MCH: 26.5 pg (ref 26.0–34.0)
MCHC: 30.9 g/dL (ref 30.0–36.0)
MCV: 85.6 fL (ref 80.0–100.0)
Monocytes Absolute: 0.3 10*3/uL (ref 0.1–1.0)
Monocytes Relative: 4 %
Neutro Abs: 5.8 10*3/uL (ref 1.7–7.7)
Neutrophils Relative %: 75 %
Platelets: 191 10*3/uL (ref 150–400)
RBC: 4.87 MIL/uL (ref 3.87–5.11)
RDW: 15.1 % (ref 11.5–15.5)
WBC: 7.7 10*3/uL (ref 4.0–10.5)
nRBC: 0 % (ref 0.0–0.2)

## 2023-06-14 LAB — GLUCOSE, CAPILLARY: Glucose-Capillary: 87 mg/dL (ref 70–99)

## 2023-06-14 LAB — CBG MONITORING, ED: Glucose-Capillary: 137 mg/dL — ABNORMAL HIGH (ref 70–99)

## 2023-06-14 LAB — COMPREHENSIVE METABOLIC PANEL
ALT: 16 U/L (ref 0–44)
AST: 28 U/L (ref 15–41)
Albumin: 3.6 g/dL (ref 3.5–5.0)
Alkaline Phosphatase: 44 U/L (ref 38–126)
Anion gap: 11 (ref 5–15)
BUN: 25 mg/dL — ABNORMAL HIGH (ref 8–23)
CO2: 22 mmol/L (ref 22–32)
Calcium: 9.8 mg/dL (ref 8.9–10.3)
Chloride: 103 mmol/L (ref 98–111)
Creatinine, Ser: 0.77 mg/dL (ref 0.44–1.00)
GFR, Estimated: >60 mL/min (ref >60)
Glucose, Bld: 125 mg/dL — ABNORMAL HIGH (ref 70–99)
Potassium: 5.5 mmol/L — ABNORMAL HIGH (ref 3.5–5.1)
Sodium: 136 mmol/L (ref 135–145)
Total Bilirubin: 0.7 mg/dL (ref <1.2)
Total Protein: 7.3 g/dL (ref 6.5–8.1)

## 2023-06-14 LAB — URINALYSIS, ROUTINE W REFLEX MICROSCOPIC
Bilirubin Urine: NEGATIVE
Glucose, UA: NEGATIVE mg/dL
Hgb urine dipstick: NEGATIVE
Ketones, ur: NEGATIVE mg/dL
Leukocytes,Ua: NEGATIVE
Nitrite: NEGATIVE
Protein, ur: 30 mg/dL — AB
Specific Gravity, Urine: 1.017 (ref 1.005–1.030)
pH: 5 (ref 5.0–8.0)

## 2023-06-14 LAB — CK: Total CK: 16 U/L — ABNORMAL LOW (ref 38–234)

## 2023-06-14 LAB — RAPID URINE DRUG SCREEN, HOSP PERFORMED
Amphetamines: NOT DETECTED
Barbiturates: NOT DETECTED
Benzodiazepines: NOT DETECTED
Cocaine: NOT DETECTED
Opiates: NOT DETECTED
Tetrahydrocannabinol: NOT DETECTED

## 2023-06-14 LAB — BLOOD GAS, ARTERIAL
Acid-base deficit: 0.8 mmol/L (ref 0.0–2.0)
Bicarbonate: 23.5 mmol/L (ref 20.0–28.0)
O2 Saturation: 96.5 %
Patient temperature: 36.4
pCO2 arterial: 36 mm[Hg] (ref 32–48)
pH, Arterial: 7.42 (ref 7.35–7.45)
pO2, Arterial: 72 mm[Hg] — ABNORMAL LOW (ref 83–108)

## 2023-06-14 LAB — PROTIME-INR
INR: 1.1 (ref 0.8–1.2)
Prothrombin Time: 14 s (ref 11.4–15.2)

## 2023-06-14 LAB — ETHANOL: Alcohol, Ethyl (B): <10 mg/dL (ref <10)

## 2023-06-14 LAB — PHOSPHORUS: Phosphorus: 3.4 mg/dL (ref 2.5–4.6)

## 2023-06-14 LAB — APTT: aPTT: 22 s — ABNORMAL LOW (ref 24–36)

## 2023-06-14 LAB — I-STAT CG4 LACTIC ACID, ED: Lactic Acid, Venous: 1.7 mmol/L (ref 0.5–1.9)

## 2023-06-14 MED ORDER — SODIUM BICARBONATE 8.4 % IV SOLN
INTRAVENOUS | Status: DC
  Filled 2023-06-14 (×4): qty 1000

## 2023-06-14 MED ORDER — POLYETHYLENE GLYCOL 3350 17 G PO PACK: 17.0000 g | PACK | Freq: Every day | ORAL | Status: DC | PRN

## 2023-06-14 MED ORDER — CARVEDILOL 12.5 MG PO TABS
12.5000 mg | ORAL_TABLET | Freq: Two times a day (BID) | ORAL | Status: DC
  Administered 2023-06-15 – 2023-06-17 (×5): 12.5 mg via ORAL
  Filled 2023-06-14 (×4): qty 1

## 2023-06-14 MED ORDER — INSULIN ASPART 100 UNIT/ML IJ SOLN
0.0000 [IU] | Freq: Every day | INTRAMUSCULAR | Status: DC
Start: 2023-06-14 — End: 2023-06-18

## 2023-06-14 MED ORDER — LORAZEPAM 2 MG/ML IJ SOLN
1.0000 mg | Freq: Once | INTRAMUSCULAR | Status: AC
  Administered 2023-06-14: 1 mg via INTRAVENOUS

## 2023-06-14 MED ORDER — INSULIN ASPART 100 UNIT/ML IJ SOLN
0.0000 [IU] | Freq: Three times a day (TID) | INTRAMUSCULAR | Status: DC
  Administered 2023-06-15: 2 [IU] via SUBCUTANEOUS
  Administered 2023-06-16: 3 [IU] via SUBCUTANEOUS
  Administered 2023-06-16 (×2): 2 [IU] via SUBCUTANEOUS
  Administered 2023-06-17: 3 [IU] via SUBCUTANEOUS
  Administered 2023-06-17: 2 [IU] via SUBCUTANEOUS
  Administered 2023-06-18: 1 [IU] via SUBCUTANEOUS

## 2023-06-14 MED ORDER — HYDRALAZINE HCL 20 MG/ML IJ SOLN
10.0000 mg | INTRAMUSCULAR | Status: DC | PRN
  Administered 2023-06-15 – 2023-06-16 (×3): 10 mg via INTRAVENOUS
  Filled 2023-06-14 (×3): qty 1

## 2023-06-14 MED ORDER — DEXTROSE IN LACTATED RINGERS 5 % IV SOLN: INTRAVENOUS | Status: DC

## 2023-06-14 MED ORDER — ASPIRIN 81 MG PO CHEW
81.0000 mg | CHEWABLE_TABLET | Freq: Every day | ORAL | Status: DC
  Administered 2023-06-15 – 2023-06-18 (×4): 81 mg via ORAL
  Filled 2023-06-14 (×4): qty 1

## 2023-06-14 MED ORDER — ACETAMINOPHEN 650 MG RE SUPP: 650.0000 mg | Freq: Four times a day (QID) | RECTAL | Status: DC | PRN

## 2023-06-14 MED ORDER — LORAZEPAM 2 MG/ML IJ SOLN
INTRAMUSCULAR | Status: AC
  Filled 2023-06-14: qty 1

## 2023-06-14 MED ORDER — PANTOPRAZOLE SODIUM 20 MG PO TBEC
20.0000 mg | DELAYED_RELEASE_TABLET | Freq: Every day | ORAL | Status: DC
  Administered 2023-06-15: 20 mg via ORAL
  Filled 2023-06-14: qty 1

## 2023-06-14 MED ORDER — LORAZEPAM 2 MG/ML IJ SOLN
1.0000 mg | Freq: Once | INTRAMUSCULAR | Status: AC
  Administered 2023-06-14: 1 mg via INTRAVENOUS
  Filled 2023-06-14: qty 1

## 2023-06-14 MED ORDER — ACETAMINOPHEN 325 MG PO TABS
650.0000 mg | ORAL_TABLET | Freq: Four times a day (QID) | ORAL | Status: DC | PRN
  Administered 2023-06-15 (×2): 650 mg via ORAL
  Filled 2023-06-14 (×2): qty 2

## 2023-06-14 MED ORDER — LAMOTRIGINE 25 MG PO TABS
50.0000 mg | ORAL_TABLET | Freq: Two times a day (BID) | ORAL | Status: DC
  Administered 2023-06-15 – 2023-06-18 (×7): 50 mg via ORAL
  Filled 2023-06-14 (×7): qty 2

## 2023-06-14 MED ORDER — LABETALOL HCL 5 MG/ML IV SOLN
20.0000 mg | INTRAVENOUS | Status: DC | PRN
  Administered 2023-06-14 – 2023-06-15 (×2): 20 mg via INTRAVENOUS
  Filled 2023-06-14 (×2): qty 4

## 2023-06-14 MED ORDER — ALBUTEROL SULFATE (2.5 MG/3ML) 0.083% IN NEBU
2.5000 mg | INHALATION_SOLUTION | RESPIRATORY_TRACT | Status: AC
  Administered 2023-06-14 – 2023-06-15 (×2): 2.5 mg via RESPIRATORY_TRACT
  Filled 2023-06-14 (×4): qty 3

## 2023-06-14 MED ORDER — LEVETIRACETAM IN NACL 1000 MG/100ML IV SOLN
2000.0000 mg | Freq: Once | INTRAVENOUS | Status: AC
  Administered 2023-06-14: 2000 mg via INTRAVENOUS
  Filled 2023-06-14: qty 200

## 2023-06-14 MED ORDER — CLOPIDOGREL BISULFATE 75 MG PO TABS
75.0000 mg | ORAL_TABLET | Freq: Every day | ORAL | Status: DC
  Administered 2023-06-15 – 2023-06-18 (×4): 75 mg via ORAL
  Filled 2023-06-14 (×4): qty 1

## 2023-06-14 MED ORDER — ENOXAPARIN SODIUM 40 MG/0.4ML IJ SOSY
40.0000 mg | PREFILLED_SYRINGE | INTRAMUSCULAR | Status: DC
  Administered 2023-06-15 – 2023-06-18 (×4): 40 mg via SUBCUTANEOUS
  Filled 2023-06-14 (×4): qty 0.4

## 2023-06-14 MED ORDER — IOHEXOL 350 MG/ML SOLN
100.0000 mL | Freq: Once | INTRAVENOUS | Status: AC | PRN
  Administered 2023-06-14: 100 mL via INTRAVENOUS

## 2023-06-14 NOTE — Assessment & Plan Note (Addendum)
Following what seems to be an absence seizure by description by family member.  CAT scan cranium is not actionable.  Report from the ER attending there was some concern of left-sided neglect on the initial evaluation in the ER.  Therefore we will do an MRI to rule out stroke. patient has received 2 mg of Ativan as well as Keppra in the ER.  To break any ongoing seizures.  Certainly the Ativan will take up till around 11 PM tonight to washout.  Will keep the patient on neurochecks.  MRI brain is pending, I will go ahead and get an EEG as well.  Urine drug screen is negative.  I will check an ABG to make sure were not dealing with occult hypoxemia or hypercapnia.  Maintain patient on telemetry.  Neurology recommends Lamictal 50 twice daily once patient is able to tolerate p.o. diet.  It is noted that the patient recently started nortriptyline 10 mg daily, it had been stopped at the time of last discharge.  This is of uncertain clinical significance.  I will continue to hold it for now.

## 2023-06-14 NOTE — Assessment & Plan Note (Addendum)
Mild, incidental, EKG without any peaked T waves.  Hold patient's enalapril, treat with albuterol nebs and IV fluid infusion tonight. Check phos , ck

## 2023-06-14 NOTE — ED Notes (Signed)
Pt returned from MRI, unable to complete MRI at this time d/t pt movement despite additional ativan 1mg  administration. Provider Maryjean Ka MD aware and verbal orders to defer MRI at this time.

## 2023-06-14 NOTE — Consult Note (Signed)
NEUROLOGY CONSULT NOTE   Date of service: June 14, 2023 Patient Name: Michelle [name] MRN:  [id number] DOB:  [birth year] Chief Complaint: "Code stroke" Requesting Provider: Laurence Spates, MD  History of Present Illness  Michelle [name] is a 89 y.o. 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   Patient had an absence seizure at 0800 today, consisting of staring off into space and being unresponsive.  EMS was called by her family, but she declined transport to the hospital.  She has not altered mental status since then, and enough at home but was difficult to arouse, also family called EMS to bring her back to the hospital.  On exam, she is noted to have a questionable right gaze deviation and does not blink to threat on the left.  Patient is noted to have a cough, but her son states that this is her normal COPD cough.  She has not had any symptoms of a UTI or other infection recently.  LKW: 0800 Modified rankin score: 3-Moderate disability-requires help but walks WITHOUT assistance IV Thrombolysis:   No, outside of window EVT:  No, no LVO  NIHSS components Score: Comment  1a Level of Conscious 0[x]  1[]  2[]  3[]      1b LOC Questions 0[]  1[]  2[x]       1c LOC Commands 0[x]  1[]  2[]       2 Best Gaze 0[]  1[x]  2[]       3 Visual 0[]  1[]  2[x]  3[]      4 Facial Palsy 0[x]  1[]  2[]  3[]      5a Motor Arm - left 0[]  1[x]  2[]  3[]  4[]  UN[]    5b Motor Arm - Right 0[]  1[x]  2[]  3[]  4[]  UN[]    6a Motor Leg - Left 0[]  1[]  2[x]  3[]  4[]  UN[]    6b Motor Leg  - Right 0[]  1[]  2[x]  3[]  4[]  UN[]    7 Limb Ataxia 0[x]  1[]  2[]  3[]  UN[]     8 Sensory 0[x]  1[]  2[]  UN[]      9 Best Language 0[x]  1[]  2[]  3[]      10 Dysarthria 0[x]  1[]  2[]  UN[]      11 Extinct. and Inattention 0[x]  1[]  2[]       TOTAL:11       ROS   Unable to ascertain due to altered mental status.  Past History   Past Medical History:  Diagnosis Date   Anginal pain (HCC)    Arthritis    AV block, 1st degree    CAD (coronary artery disease) 1990; 2015   Cardiac cath 1990 with Dr. Riley Kill and [name] Residual CAD, non obstructive per 2015 cath. STEMI status post stent in August 2015.   COPD (chronic obstructive pulmonary disease) (HCC)    Diabetes mellitus type 2, insulin dependent (HCC)    Essential hypertension    Hyperlipidemia with target LDL less than 70    Myocardial infarction [facility])    Pericarditis-post MI (short course of steroids) 03/06/2014   S/P coronary artery stent placement 02/18/14,  DES -RCA to cover RCA aneurysm 02/18/14   Promus DES to RCA with STEMI   Shortness of breath     Past Surgical History:  Procedure Laterality Date   CATARACT EXTRACTION  2020   right and left eye    CHOLECYSTECTOMY     thinks her appendix was removed at the same time   CORONARY ANGIOPLASTY WITH STENT PLACEMENT  02/18/14   Promus DES to RCA   LEFT HEART CATHETERIZATION WITH CORONARY ANGIOGRAM N/A 02/18/2014   Procedure: LEFT HEART CATHETERIZATION WITH CORONARY ANGIOGRAM;  Surgeon: Corky Crafts, MD;  Location: [facility] CATH LAB;  Service: Cardiovascular;  Laterality: N/A;   PTCA  1990   PTCA of RCA   TRANSTHORACIC ECHOCARDIOGRAM  02/02/2012   mild LVH, EF 55-60%, Normal WM, Gr 1 DD; Mild MR    Family History: Family History  Problem Relation Age of Onset   Heart attack Mother 9   Cancer Father 22   Cancer Maternal Grandfather     Cancer Paternal Grandmother    Cancer Paternal Grandfather    Diabetes Son    Liver cancer Brother    Bone cancer Brother    Heart attack Son    Hypertension Son    Diabetes Son    Hyperlipidemia Son    Colon cancer Neg Hx    Ovarian cancer Neg Hx    Uterine cancer Neg Hx     Social History  reports that she quit smoking about 16 years ago. Her smoking use included cigarettes. She started smoking about 71 years ago. She has a 55 pack-year smoking history. She has never used smokeless tobacco. She reports that she does not drink alcohol and does not use drugs.  Allergies  Allergen Reactions   Codeine Sulfate Nausea Only   Morphine Sulfate Nausea Only    Medications  No current facility-administered medications for this encounter.  Current Outpatient Medications:    acetaminophen (TYLENOL) 650 MG CR tablet, Take 1,300 mg by mouth 3 (three) times daily., Disp: , Rfl:    albuterol (PROVENTIL) (2.5 MG/3ML) 0.083% nebulizer solution, USE THREE MILLILITERS VIA NEBULIZATION BY MOUTH EVERY 6 HOURS AS NEEDED FOR WHEEZING OR SHORTNESS OF BREATH (Patient taking differently: Take 2.5 mg by nebulization every 6 (six) hours as needed for wheezing or shortness of breath.), Disp: 75 mL, Rfl: 3   albuterol (VENTOLIN HFA) 108 (90 Base) MCG/ACT inhaler, INHALE 1 PUFF EVERY 4 HOURS AS NEEDED FOR WHEEZING, SHORTNESS OF BREATH (RESCUE INHALER IF ADVAIR NOT WORKING) (Patient taking differently: Inhale 2 puffs into the lungs as needed.), Disp: 2 each, Rfl: 2   amLODipine (NORVASC) 2.5 MG tablet, TAKE 1 TABLET EVERY MORNING. IF AFTERNOON BLOOD PRESSURE IS OVER 145/90 TAKE SECOND TABLET., Disp: 180 tablet, Rfl: 3   aspirin 81 MG chewable tablet, Chew 81 mg by mouth daily., Disp: , Rfl:    blood glucose meter kit and supplies KIT, Dispense based on patient and insurance preference. Use up to four times daily as directed. Dx:E11.9, Disp: 1 each, Rfl: 3   calcium carbonate (OSCAL) 1500 (600 Ca) MG TABS tablet,  Take 600 mg of elemental calcium by mouth daily., Disp: , Rfl:    carvedilol (COREG) 12.5 MG tablet, Take 1 tablet (12.5 mg total) by mouth 2 (two) times daily., Disp: 180 tablet, Rfl: 3   cholecalciferol (VITAMIN D3) 25 MCG (1000 UNIT) tablet, Take 1,000 Units by mouth daily with breakfast., Disp: , Rfl:    clopidogrel (PLAVIX) 75 MG tablet, TAKE  1 TABLET EVERY DAY (Patient taking differently: Take 75 mg by mouth daily.), Disp: 90 tablet, Rfl: 3   diazePAM, 15 MG Dose, (VALTOCO 15 MG DOSE) 2 x 7.5 MG/0.1ML LQPK, Place 15 mg into the nose as needed (seizure lasting more than 2 minutes)., Disp: 10 each, Rfl: 0   diclofenac Sodium (VOLTAREN) 1 % GEL, Apply 2 g topically 4 (four) times daily., Disp: 300 g, Rfl: 3   enalapril (VASOTEC) 20 MG tablet, Take 1 tablet (20 mg total) by mouth at bedtime. (Patient taking differently: Take 20 mg by mouth at bedtime.), Disp: 90 tablet, Rfl: 3   fenofibrate micronized (LOFIBRA) 134 MG capsule, TAKE 1 CAPSULE EVERY DAY, Disp: 90 capsule, Rfl: 3   ferrous sulfate 325 (65 FE) MG tablet, Twice a week (Patient taking differently: Take 325 mg by mouth 2 (two) times a week. Tuesday  and Friday), Disp: 18 tablet, Rfl: 3   fluticasone-salmeterol (ADVAIR) 250-50 MCG/ACT AEPB, INHALE 1 PUFF TWICE DAILY, Disp: 180 each, Rfl: 3   folic acid (FOLVITE) 400 MCG tablet, Take 400 mcg by mouth at bedtime., Disp: , Rfl:    furosemide (LASIX) 20 MG tablet, Take 1 tablet (20 mg total) by mouth as needed for fluid or edema., Disp: 90 tablet, Rfl: 2   icosapent Ethyl (VASCEPA) 1 g capsule, Take 2 capsules (2 g total) by mouth 2 (two) times daily. (Patient taking differently: Take 1-2 g by mouth See admin instructions. Take 1 g gram in the morning and afternoon. Take 2 g at bedtime), Disp: 360 capsule, Rfl: 3   lamoTRIgine (LAMICTAL) 25 MG tablet, Take 1 tablet (25 mg total) by mouth in the morning AND 2 tablets (50 mg total) at bedtime., Disp: , Rfl:    Lancets Misc. (ACCU-CHEK FASTCLIX  LANCET) KIT, Use as directed to test blood sugar, Disp: 1 kit, Rfl: 1   loperamide (IMODIUM A-D) 2 MG tablet, Take 2-4 mg by mouth 2 (two) times daily as needed for diarrhea or loose stools., Disp: , Rfl:    loratadine (CLARITIN) 10 MG tablet, Take 10 mg by mouth every other day., Disp: , Rfl:    medroxyPROGESTERone (PROVERA) 10 MG tablet, Take 1 tablet (10 mg total) by mouth daily. (Patient taking differently: Take 10 mg by mouth at bedtime.), Disp: 30 tablet, Rfl: 3   nitroGLYCERIN (NITROSTAT) 0.4 MG SL tablet, DISSOLVE 1 TAB UNDER TONGUE FOR CHEST PAIN - IF PAIN REMAINS AFTER 5 MIN, CALL 911 AND REPEAT DOSE. MAX 3 TABS IN 15 MINUTES (Patient taking differently: Place 0.4 mg under the tongue every 5 (five) minutes as needed for chest pain. DISSOLVE 1 TAB UNDER TONGUE FOR CHEST PAIN - IF PAIN REMAINS AFTER 5 MIN, CALL 911 AND REPEAT DOSE. MAX 3 TABS IN 15 MINUTES), Disp: 25 tablet, Rfl: 6   pantoprazole (PROTONIX) 40 MG tablet, TAKE 1 TABLET EVERY DAY, Disp: 90 tablet, Rfl: 3   psyllium (METAMUCIL) 58.6 % packet, Take 1 packet by mouth as needed (fiber)., Disp: , Rfl:    QUEtiapine (SEROQUEL) 25 MG tablet, Take 1 tablet (25 mg total) by mouth every evening. Between 1830 and 1900, Disp: , Rfl:    rosuvastatin (CRESTOR) 40 MG tablet, Take 1 tablet (40 mg total) by mouth daily., Disp: 90 tablet, Rfl: 3   sertraline (ZOLOFT) 50 MG tablet, TAKE 2 TABLETS AT BEDTIME (Patient taking differently: Take 100 mg by mouth at bedtime.), Disp: 180 tablet, Rfl: 3   SPIRIVA HANDIHALER 18 MCG inhalation capsule, PLACE 1  CAPSULE INTO HANDIHALER AND INHALE THE CONTENTS DAILY (Patient taking differently: Place 18 mcg into inhaler and inhale daily.), Disp: 90 capsule, Rfl: 3   TRUE METRIX BLOOD GLUCOSE TEST test strip, TEST BLOOD SUGAR UP TO FOUR TIMES DAILY AS DIRECTED, Disp: 400 strip, Rfl: 3   TRUEplus Lancets 33G MISC, TEST BLOOD SUGAR UP TO FOUR TIMES DAILY AS DIRECTED, Disp: 400 each, Rfl: 3   vitamin B-12  (CYANOCOBALAMIN) 1000 MCG tablet, Take 1,000 mcg by mouth daily with breakfast., Disp: , Rfl:    vitamin E 400 UNIT capsule, Take 400 Units by mouth at bedtime., Disp: , Rfl:   Vitals   Vitals:   06/14/23 1545 06/14/23 1546  BP: (!) 156/111   Pulse: 82   Resp: 18   Temp: 98.4 F (36.9 C)   TempSrc: Oral   SpO2: 100%   Weight:  76 kg  Height:  5\' 5"  (1.651 m)    Body mass index is 27.88 kg/m.  Physical Exam   Constitutional: Frail-appearing elderly patient in no acute distress Psych: Affect appropriate to situation.  Eyes: No scleral injection.  HENT: No OP obstruction.  Head: Normocephalic.  Cardiovascular: Normal rate and regular rhythm.  Respiratory: Effort normal, non-labored breathing.  Skin: WDI.   Neurologic Examination    NEURO:  Mental Status: Alert, able to state name but disoriented to place time and situation.  Poor attention and some agitation Speech/Language: speech is without dysarthria or aphasia.  Unable to name objects, stating she cannot see them  Cranial Nerves:  II: PERRL.  Does not blink to threat on the left III, IV, VI: Right gaze preference but able to cross midline V: Sensation is intact to light touch and symmetrical to face.  VII: Smile is symmetrical.  VIII: hearing intact to voice. IX, X: Phonation is normal.  XII: tongue is midline without fasciculations. Motor: Able to move all 4 extremities with antigravity strength Tone: is normal and bulk is normal Sensation- Intact to light touch bilaterally.  Coordination: Unable to perform Gait- deferred   Labs/Imaging/Neurodiagnostic studies   CBC:  Recent Labs  Lab July 05, 2023 1433 06/14/23 1630 06/14/23 1637  WBC 8.4 7.7  --   NEUTROABS 6.0 5.8  --   HGB 11.2* 12.9 13.9  HCT 34.7* 41.7 41.0  MCV 86.4 85.6  --   PLT 221.0 191  --    Basic Metabolic Panel:  Lab Results  Component Value Date   NA 136 06/14/2023   K 5.8 (H) 06/14/2023   CO2 26 07-05-2023   GLUCOSE 126 (H)  06/14/2023   BUN 38 (H) 06/14/2023   CREATININE 0.80 06/14/2023   CALCIUM 9.4 2023/07/05   GFRNONAA >60 05/24/2023   GFRAA 77 07/01/2020   Lipid Panel:  Lab Results  Component Value Date   LDLCALC 32 08/19/2022   HgbA1c:  Lab Results  Component Value Date   HGBA1C 6.4 07/05/2023   Urine Drug Screen:     Component Value Date/Time   LABOPIA NONE DETECTED 05/17/2023 1000   COCAINSCRNUR NONE DETECTED 05/17/2023 1000   LABBENZ NONE DETECTED 05/17/2023 1000   AMPHETMU NONE DETECTED 05/17/2023 1000   THCU NONE DETECTED 05/17/2023 1000   LABBARB NONE DETECTED 05/17/2023 1000    Alcohol Level     Component Value Date/Time   ETH <10 05/17/2023 0918   INR  Lab Results  Component Value Date   INR 1.2 02/06/2023   APTT  Lab Results  Component Value Date   APTT 25  03/05/2014   AED levels:  Lab Results  Component Value Date   LAMOTRIGINE <1.0 (L) 05/17/2023    CT Head without contrast(Personally reviewed): No acute abnormality  CT angio Head and Neck with contrast(Personally reviewed): No emergent LVO, exam motion limited, 50% stenosis at origin of left subclavian  MRI Brain(Personally reviewed): Pending  Neurodiagnostics rEEG:  Pending  ASSESSMENT   Michelle [name] is a 89 y.o. 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  Patient had one of her typical absence seizure's at 8:00 AM, consisting of staring off into space and being unresponsive.  Per family called EMS, but she declined to go to the hospital at that  time.  Afterwards, she had altered mental status and took a nap but was difficult to awaken with decreased responsiveness.  EMS was called again, and patient was brought to the hospital.  There was some question of left-sided weakness, but on exam her strength is symmetrical.  She does not blink to threat on the left and has a questionable right gaze preference.  CT head demonstrates no acute abnormality, and no LVO was seen on CTA, with no core perfusion infarct is seen on CT perfusion.  Patient will need MRI with full stroke workup if this is positive.  Will also need routine EEG and workup for causes of toxic metabolic encephalopathy.  Impression: Altered mental status due to other toxic metabolic encephalopathy or postictal state  RECOMMENDATIONS  -MRI brain without contrast, commence full stroke workup if positive -Urinalysis and chest x-ray, workup for causes of toxic metabolic encephalopathy per primary team -Load with Keppra 2 g IV now -Continue home lamotrigine 25 mg in the morning and 50 mg at night when able to swallow -Continue home Seroquel 25 mg Q 6 PM when able to swallow -Routine EEG ______________________________________________________________________    Signed, Cortney Harland Dingwall, NP [facility]    Attending addendum Patient seen and examined Imaging personally reviewed Patient with a history of recent admission for breakthrough seizures with prior history of seizures, diabetes, COPD, modified Rankin score of 4 at baseline, presenting with concern for depressed mental status after a staring spell this morning concerning for seizure.  Family called EMS that responded but she refused transport earlier in the morning and then since has been very sleepy. Agree with the examination above New stroke versus recrudescence of stroke symptoms versus seizure/status epilepticus remain the differentials. CT head and CT angio head and neck unremarkable for acute process  or large vessel occlusion. Not a candidate for IV TNKase due to not being in the window   Impression: Likely toxic metabolic encephalopathy versus breakthrough seizure with prolonged postictal state   Recommendations: MRI brain without contrast-if positive for stroke-pursue full stroke risk factor workup She is on Lamictal 25 mg in the morning and 50 mg at night.  Increase that to Lamictal 50 twice daily once she is able to take p.o. medications. Give her one-time Keppra 2000 mg IV load.  Hopefully she will be able to take the p.o. medications from tomorrow. Seizure precautions Routine EEG in the morning Neurology will follow Plan discussed  with Dr. Earlene Plater     -- Milon Dikes, MD Neurologist [facility]

## 2023-06-14 NOTE — Code Documentation (Signed)
Nekaybaw Karter is an 89 yr old female with a PMH of Epilepsy, DM, CAD, CHF arriving to [facility] via POV on 06/14/2023. Pt is coming from home, where she was last known well at 8 AM. After that time, pt has been drowsier than usual and not herself. EDP activated code stroke for AMS and rt gaze preference.  Pt does not take blood thinners.     Stroke team met up with pt in CT scan. Pt had symmetrical weakness of all extremities, Rt gaze preference, and Lt hemianopia.NIHSS 11. The following imaging was completed: CT, CTA/P. Per Dr. Wilford Corner, CT is negative for acute hemorrhage. Some delay in process due to difficult IV stick, and pt unable to be still. Total 2 mg Ativan given. Per Dr. Wilford Corner, CTA is negative for LVO or perfusion deficit.     Pt back to ED room 27 where her workup will continue.Pt is ineligible for TNK due to outside of the treatment window. Not candidate for EVT as LVO negative. Pt will need q 2 hr VS and NIHSS. Bedside handoff with ED RN complete.

## 2023-06-14 NOTE — ED Notes (Signed)
Pt in MRI.

## 2023-06-14 NOTE — ED Provider Notes (Signed)
EMERGENCY DEPARTMENT AT [facility] Provider Note   CSN: [id number] Arrival date & time: 06/14/23  1541  An emergency department physician performed an initial assessment on this suspected stroke patient at 1630.  History  Chief Complaint  Patient presents with   Altered Mental Status    Michelle [name] is a 89 y.o. female.   Altered Mental Status This is an 89 year old female history of COPD, hyperlipidemia, hypertension, epilepsy presenting for altered mental status.  She is here with her sons.  Sons state that she was last at her baseline this morning.  She got up to go to the bathroom and on the way to the bathroom they noticed that she had not observed she was standing and staring off concerning for what they call silent seizure.  They called EMS, patient reviewed EMS transfer at that time.  However throughout the day she is been very sleepy and confused compared to normal.  She is not any falls or traumas.  No fevers.  She is been taking her medications.  No gross seizure-like activity.     Home Medications Prior to Admission medications   Medication Sig Start Date End Date Taking? Authorizing Provider  acetaminophen (TYLENOL) 650 MG CR tablet Take 1,300 mg by mouth 3 (three) times daily.    [provider]  albuterol (PROVENTIL) (2.5 MG/3ML) 0.083% nebulizer solution USE THREE MILLILITERS VIA NEBULIZATION BY MOUTH EVERY 6 HOURS AS NEEDED FOR WHEEZING OR SHORTNESS OF BREATH Patient taking differently: Take 2.5 mg by nebulization every 6 (six) hours as needed for wheezing or shortness of breath. 10/12/21   Shelva Majestic, MD  albuterol (VENTOLIN HFA) 108 (90 Base) MCG/ACT inhaler INHALE 1 PUFF EVERY 4 HOURS AS NEEDED FOR WHEEZING, SHORTNESS OF BREATH (RESCUE INHALER IF ADVAIR NOT WORKING) Patient taking differently: Inhale 2 puffs into the lungs as needed. 03/29/23   Shelva Majestic, MD  amLODipine (NORVASC) 2.5 MG tablet TAKE 1 TABLET EVERY  MORNING. IF AFTERNOON BLOOD PRESSURE IS OVER 145/90 TAKE SECOND TABLET. 06/05/23   Shelva Majestic, MD  aspirin 81 MG chewable tablet Chew 81 mg by mouth daily.    [provider]  blood glucose meter kit and supplies KIT Dispense based on patient and insurance preference. Use up to four times daily as directed. Dx:E11.9 04/01/21   Shelva Majestic, MD  calcium carbonate (OSCAL) 1500 (600 Ca) MG TABS tablet Take 600 mg of elemental calcium by mouth daily.    [provider]  carvedilol (COREG) 12.5 MG tablet Take 1 tablet (12.5 mg total) by mouth 2 (two) times daily. 12/06/22   Alver Sorrow, NP  cholecalciferol (VITAMIN D3) 25 MCG (1000 UNIT) tablet Take 1,000 Units by mouth daily with breakfast.    [provider]  clopidogrel (PLAVIX) 75 MG tablet TAKE 1 TABLET EVERY DAY Patient taking differently: Take 75 mg by mouth daily. 11/23/22   Shelva Majestic, MD  diazePAM, 15 MG Dose, (VALTOCO 15 MG DOSE) 2 x 7.5 MG/0.1ML LQPK Place 15 mg into the nose as needed (seizure lasting more than 2 minutes). 06/12/23   Shelva Majestic, MD  diclofenac Sodium (VOLTAREN) 1 % GEL Apply 2 g topically 4 (four) times daily. 03/22/23   Shelva Majestic, MD  enalapril (VASOTEC) 20 MG tablet Take 1 tablet (20 mg total) by mouth at bedtime. Patient taking differently: Take 20 mg by mouth at bedtime. 03/23/23   Alver Sorrow, NP  fenofibrate micronized (  LOFIBRA) 134 MG capsule TAKE 1 CAPSULE EVERY DAY 12/27/22   Shelva Majestic, MD  ferrous sulfate 325 (65 FE) MG tablet Twice a week Patient taking differently: Take 325 mg by mouth 2 (two) times a week. Tuesday  and Friday 02/21/20   Shelva Majestic, MD  fluticasone-salmeterol (ADVAIR) 250-50 MCG/ACT AEPB INHALE 1 PUFF TWICE DAILY 09/12/22   Shelva Majestic, MD  folic acid (FOLVITE) 400 MCG tablet Take 400 mcg by mouth at bedtime.    [provider]  furosemide (LASIX) 20 MG tablet Take 1 tablet (20 mg total) by mouth as needed  for fluid or edema. 03/23/23   Alver Sorrow, NP  icosapent Ethyl (VASCEPA) 1 g capsule Take 2 capsules (2 g total) by mouth 2 (two) times daily. Patient taking differently: Take 1-2 g by mouth See admin instructions. Take 1 g gram in the morning and afternoon. Take 2 g at bedtime 12/06/22   Alver Sorrow, NP  lamoTRIgine (LAMICTAL) 25 MG tablet Take 1 tablet (25 mg total) by mouth in the morning AND 2 tablets (50 mg total) at bedtime. 05/26/23   Narda Bonds, MD  Lancets Misc. (ACCU-CHEK FASTCLIX LANCET) KIT Use as directed to test blood sugar 11/13/20   Shelva Majestic, MD  loperamide (IMODIUM A-D) 2 MG tablet Take 2-4 mg by mouth 2 (two) times daily as needed for diarrhea or loose stools.    [provider]  loratadine (CLARITIN) 10 MG tablet Take 10 mg by mouth every other day.    [provider]  medroxyPROGESTERone (PROVERA) 10 MG tablet Take 1 tablet (10 mg total) by mouth daily. Patient taking differently: Take 10 mg by mouth at bedtime. 09/12/22   Cross, Efraim Kaufmann D, NP  nitroGLYCERIN (NITROSTAT) 0.4 MG SL tablet DISSOLVE 1 TAB UNDER TONGUE FOR CHEST PAIN - IF PAIN REMAINS AFTER 5 MIN, CALL 911 AND REPEAT DOSE. MAX 3 TABS IN 15 MINUTES Patient taking differently: Place 0.4 mg under the tongue every 5 (five) minutes as needed for chest pain. DISSOLVE 1 TAB UNDER TONGUE FOR CHEST PAIN - IF PAIN REMAINS AFTER 5 MIN, CALL 911 AND REPEAT DOSE. MAX 3 TABS IN 15 MINUTES 01/17/23   Chilton Si, MD  pantoprazole (PROTONIX) 40 MG tablet TAKE 1 TABLET EVERY DAY 05/02/23   Shelva Majestic, MD  psyllium (METAMUCIL) 58.6 % packet Take 1 packet by mouth as needed (fiber).    [provider]  QUEtiapine (SEROQUEL) 25 MG tablet Take 1 tablet (25 mg total) by mouth every evening. Between 1830 and 1900 05/26/23   Narda Bonds, MD  rosuvastatin (CRESTOR) 40 MG tablet Take 1 tablet (40 mg total) by mouth daily. 05/17/23   Shelva Majestic, MD  sertraline (ZOLOFT) 50 MG  tablet TAKE 2 TABLETS AT BEDTIME Patient taking differently: Take 100 mg by mouth at bedtime. 10/13/22   Shelva Majestic, MD  SPIRIVA HANDIHALER 18 MCG inhalation capsule PLACE 1 CAPSULE INTO HANDIHALER AND INHALE THE CONTENTS DAILY Patient taking differently: Place 18 mcg into inhaler and inhale daily. 11/23/22   Shelva Majestic, MD  TRUE METRIX BLOOD GLUCOSE TEST test strip TEST BLOOD SUGAR UP TO FOUR TIMES DAILY AS DIRECTED 04/17/23   Shelva Majestic, MD  TRUEplus Lancets 33G MISC TEST BLOOD SUGAR UP TO FOUR TIMES DAILY AS DIRECTED 04/17/23   Shelva Majestic, MD  vitamin B-12 (CYANOCOBALAMIN) 1000 MCG tablet Take 1,000 mcg by mouth daily with breakfast.  [provider]  vitamin E 400 UNIT capsule Take 400 Units by mouth at bedtime.    [provider]      Allergies    Codeine sulfate and Morphine sulfate    Review of Systems   Review of Systems Review of systems completed and notable as per HPI.  ROS otherwise negative.   Physical Exam Updated Vital Signs BP (!) 143/62 (BP Location: Left Arm)   Pulse 66   Temp 99.5 F (37.5 C) (Oral)   Resp 18   Ht 5\' 5"  (1.651 m)   Wt 76 kg   SpO2 96%   BMI 27.88 kg/m  Physical Exam Vitals and nursing note reviewed.  Constitutional:      Appearance: She is well-developed.     Comments: Somnolent but arouses to voice  HENT:     Head: Normocephalic and atraumatic.     Mouth/Throat:     Mouth: Mucous membranes are moist.     Pharynx: Oropharynx is clear.  Eyes:     Extraocular Movements: Extraocular movements intact.     Conjunctiva/sclera: Conjunctivae normal.     Pupils: Pupils are equal, round, and reactive to light.  Cardiovascular:     Rate and Rhythm: Normal rate and regular rhythm.     Pulses: Normal pulses.     Heart sounds: Normal heart sounds. No murmur heard. Pulmonary:     Effort: Pulmonary effort is normal. No respiratory distress.     Breath sounds: Normal breath sounds.  Abdominal:      Palpations: Abdomen is soft.     Tenderness: There is no abdominal tenderness.  Musculoskeletal:        General: No swelling.     Cervical back: Normal range of motion and neck supple. No rigidity or tenderness.     Right lower leg: No edema.     Left lower leg: No edema.  Skin:    General: Skin is warm and dry.     Capillary Refill: Capillary refill takes less than 2 seconds.  Neurological:     Comments: Somnolent.  Arouses to voice.  Some difficulty holding her eyes open.  She has right-sided gaze preference, she will look to the midline but would not cross midline.  She appears to be neglecting her left hemibody.  She is able to state her name and think she is in the doctor's office.  I do not any gross aphasia.  She can count fingers in her right visual field but does not recognize my hand in her left visual fields.  She has no facial asymmetry.  Tongue extends midline.  She is able to raise her extremities antigravity without drift bilateral upper and lower extremities.  She is able to sense the palpating her oxygen but has been has some left-sided neglect when I touch both of her limbs.  Psychiatric:        Mood and Affect: Mood normal.     ED Results / Procedures / Treatments   Labs (all labs ordered are listed, but only abnormal results are displayed) Labs Reviewed  APTT - Abnormal; Notable for the following components:      Result Value   aPTT 22 (*)    All other components within normal limits  COMPREHENSIVE METABOLIC PANEL - Abnormal; Notable for the following components:   Potassium 5.5 (*)    Glucose, Bld 125 (*)    BUN 25 (*)    All other components within normal limits  URINALYSIS, ROUTINE  W REFLEX MICROSCOPIC - Abnormal; Notable for the following components:   APPearance HAZY (*)    Protein, ur 30 (*)    Bacteria, UA RARE (*)    All other components within normal limits  BLOOD GAS, ARTERIAL - Abnormal; Notable for the following components:   pO2, Arterial 72 (*)     All other components within normal limits  CK - Abnormal; Notable for the following components:   Total CK 16 (*)    All other components within normal limits  CBG MONITORING, ED - Abnormal; Notable for the following components:   Glucose-Capillary 137 (*)    All other components within normal limits  I-STAT CHEM 8, ED - Abnormal; Notable for the following components:   Potassium 5.8 (*)    BUN 38 (*)    Glucose, Bld 126 (*)    Calcium, Ion 1.04 (*)    All other components within normal limits  ETHANOL  PROTIME-INR  RAPID URINE DRUG SCREEN, HOSP PERFORMED  CBC WITH DIFFERENTIAL/PLATELET  PHOSPHORUS  GLUCOSE, CAPILLARY  CBC  DIFFERENTIAL  APTT  PROTIME-INR  BASIC METABOLIC PANEL  CBC  I-STAT CG4 LACTIC ACID, ED    EKG EKG Interpretation Date/Time:  Wednesday June 14 2023 15:47:11 EST Ventricular Rate:  80 PR Interval:  232 QRS Duration:  96 QT Interval:  363 QTC Calculation: 419 R Axis:   -30  Text Interpretation: Sinus rhythm Prolonged PR interval Left axis deviation Posterior infarct, old TWI resolved compared to prior Confirmed by Fulton Reek [id number]) on 06/14/2023 4:00:51 PM  Radiology DG CHEST PORT 1 VIEW  Result Date: 06/14/2023 CLINICAL DATA:  Cough and altered mental status. EXAM: PORTABLE CHEST 1 VIEW COMPARISON:  May 17, 2023 FINDINGS: The heart size and mediastinal contours are within normal limits. There is marked severity calcification of the thoracic aorta. Mild, diffuse, chronic appearing increased lung markings are seen with mild atelectatic changes suspected within the left lung base. No pleural effusion or pneumothorax is identified. Multilevel degenerative changes seen throughout the thoracic spine. IMPRESSION: Chronic appearing increased lung markings with mild left basilar atelectasis. Electronically Signed   By: Aram Candela M.D.   On: 06/14/2023 18:36   CT ANGIO HEAD NECK W WO CM W PERF (CODE STROKE)  Result Date:  06/14/2023 CLINICAL DATA:  Altered mental status. Seizure at 8 o'clock a.m. Dementia. EXAM: CT ANGIOGRAPHY HEAD AND NECK CT PERFUSION BRAIN TECHNIQUE: Multidetector CT imaging of the head and neck was performed using the standard protocol during bolus administration of intravenous contrast. Multiplanar CT image reconstructions and MIPs were obtained to evaluate the vascular anatomy. Carotid stenosis measurements (when applicable) are obtained utilizing NASCET criteria, using the distal internal carotid diameter as the denominator. Multiphase CT imaging of the brain was performed following IV bolus contrast injection. Subsequent parametric perfusion maps were calculated using RAPID software. RADIATION DOSE REDUCTION: This exam was performed according to the departmental dose-optimization program which includes automated exposure control, adjustment of the mA and/or kV according to patient size and/or use of iterative reconstruction technique. CONTRAST:  OMNIPAQUE IOHEXOL 350 MG/ML SOLN COMPARISON:  CT head without contrast 05/17/2023 MR head without contrast 06/25/2020. FINDINGS: CTA NECK FINDINGS Aortic arch: Study is moderately degraded by patient motion. Atherosclerotic changes are present at the aortic arch and great vessel origins. A 50% stenosis at the origin of the left subclavian is suspected. No other definite stenosis are present. Right carotid system: The right common carotid artery is within normal limits. Bifurcation demonstrates some  atherosclerotic change without focal stenosis. Mild tortuosity is present cervical right ICA without significant stenosis of greater than 50%. Left carotid system: The left common carotid artery is within normal limits. More pronounced atherosclerotic calcifications are present bifurcation proximal left ICA without significant stenosis. Mild tortuosity is present in the cervical left ICA without significant stenosis. Vertebral arteries: The vertebral arteries are not  well seen in the V1 or V2 segments due to patient motion. The more distal cervical vertebral arteries are within normal limits bilaterally. Skeleton: Multilevel degenerative changes are present cervical spine. Ankylosis with grade 1 anterolisthesis is present at C6-7. No focal osseous lesions are present. Other neck: Soft tissues the neck are otherwise unremarkable. Salivary glands are within normal limits. Thyroid is normal. No significant adenopathy is present. No focal mucosal or submucosal lesions are present. Upper chest: The lung apices are clear. The thoracic inlet is within normal limits. Review of the MIP images confirms the above findings CTA HEAD FINDINGS Anterior circulation: Atherosclerotic calcifications are present within the cavernous internal carotid arteries bilaterally without significant stenosis through the ICA termini. The right A1 segment is aplastic. Both A2 segments fill from the left. M1 segments are normal bilaterally. The MCA bifurcations are normal. The ACA and MCA branch vessels are within normal limits. No aneurysm is present. Posterior circulation: PICA origins are visualized and normal. The vertebrobasilar junction basilar artery normal. The superior cerebellar arteries are patent. Both posterior cerebral arteries originate from basilar tip. The PCA branch vessels are normal bilaterally. No aneurysm is present. Venous sinuses: The dural sinuses are patent. The straight sinus and deep cerebral veins are intact. Cortical veins are within normal limits. No significant vascular malformation is evident. Anatomic variants: None Review of the MIP images confirms the above findings CT Brain Perfusion Findings: ASPECTS: 10/10 CBF (<30%) Volume: 0mL Perfusion (Tmax>6.0s) volume: 0mL IMPRESSION: 1. No emergent large vessel occlusion. 2. Atherosclerotic changes at the carotid bifurcations and cavernous internal carotid arteries bilaterally without significant stenosis. 3. The vertebral arteries  are not well seen in the V1 or V2 segments due to patient motion. The more distal cervical vertebral arteries are within normal limits bilaterally. 4. A 50% stenosis at the origin of the left subclavian is suspected. 5. Multilevel degenerative changes of the cervical spine. 6. Ankylosis with grade 1 anterolisthesis at C6-7. 7. Normal CT perfusion. 8.  Aortic Atherosclerosis (ICD10-I70.0). Electronically Signed   By: Marin Roberts M.D.   On: 06/14/2023 17:27   CT HEAD CODE STROKE WO CONTRAST  Result Date: 06/14/2023 CLINICAL DATA:  Code stroke. Right-sided gaze. Left-sided weakness. EXAM: CT HEAD WITHOUT CONTRAST TECHNIQUE: Contiguous axial images were obtained from the base of the skull through the vertex without intravenous contrast. RADIATION DOSE REDUCTION: This exam was performed according to the departmental dose-optimization program which includes automated exposure control, adjustment of the mA and/or kV according to patient size and/or use of iterative reconstruction technique. COMPARISON:  CT head without contrast 05/17/2023 FINDINGS: Brain: Mild generalized atrophy and white matter disease is stable. No acute infarct, hemorrhage, or mass lesion is present. The ventricles are proportionate to the degree of atrophy. Deep brain nuclei are within normal limits. Insular cortex is within normal limits bilaterally. The brainstem and cerebellum are within normal limits. Midline structures are within normal limits. Vascular: Atherosclerotic calcifications are present within the cavernous internal carotid arteries and at the dural margin left vertebral artery. No hyperdense vessel is present. Skull: Calvarium is intact. No focal lytic or blastic lesions are present.  No significant extracranial soft tissue lesion is present. Sinuses/Orbits: The paranasal sinuses and mastoid air cells are clear. Bilateral lens replacements are noted. Globes and orbits are otherwise unremarkable. ASPECTS [facility] Stroke  Program Early CT Score) - Ganglionic level infarction (caudate, lentiform nuclei, internal capsule, insula, M1-M3 cortex): 7/7 - Supraganglionic infarction (M4-M6 cortex): 3/3 Total score (0-10 with 10 being normal): 10/10 IMPRESSION: 1. No acute intracranial abnormality or significant interval change. 2. Stable atrophy and white matter disease. This likely reflects the sequela of chronic microvascular ischemia. 3. Aspects is 10/10. The above was relayed via text pager to Dr. Marthe Patch on 06/14/2023 at 16:49 . Electronically Signed   By: Marin Roberts M.D.   On: 06/14/2023 16:49    Procedures Procedures    Medications Ordered in ED Medications  enoxaparin (LOVENOX) injection 40 mg (has no administration in time range)  acetaminophen (TYLENOL) tablet 650 mg (has no administration in time range)    Or  acetaminophen (TYLENOL) suppository 650 mg (has no administration in time range)  polyethylene glycol (MIRALAX / GLYCOLAX) packet 17 g (has no administration in time range)  insulin aspart (novoLOG) injection 0-5 Units ( Subcutaneous Not Given 06/14/23 2158)  insulin aspart (novoLOG) injection 0-9 Units (has no administration in time range)  labetalol (NORMODYNE) injection 20 mg (20 mg Intravenous Given 06/14/23 2114)  hydrALAZINE (APRESOLINE) injection 10 mg (has no administration in time range)  sodium bicarbonate 150 mEq in dextrose 5 % 1,150 mL infusion ( Intravenous New Bag/Given 06/14/23 2157)  albuterol (PROVENTIL) (2.5 MG/3ML) 0.083% nebulizer solution 2.5 mg (2.5 mg Nebulization Given 06/14/23 2207)  aspirin chewable tablet 81 mg (has no administration in time range)  carvedilol (COREG) tablet 12.5 mg (has no administration in time range)  clopidogrel (PLAVIX) tablet 75 mg (has no administration in time range)  lamoTRIgine (LAMICTAL) tablet 50 mg (50 mg Oral Not Given 06/14/23 2215)  pantoprazole (PROTONIX) EC tablet 20 mg (has no administration in time range)  LORazepam (ATIVAN)  injection 1 mg (1 mg Intravenous Given 06/14/23 1700)  LORazepam (ATIVAN) injection 1 mg (1 mg Intravenous Given 06/14/23 1724)  iohexol (OMNIPAQUE) 350 MG/ML injection 100 mL (100 mLs Intravenous Contrast Given 06/14/23 1718)  levETIRAcetam (KEPPRA) IVPB 1000 mg/100 mL premix (0 mg Intravenous Stopped 06/14/23 1849)  LORazepam (ATIVAN) injection 1 mg (1 mg Intravenous Given 06/14/23 1947)    ED Course/ Medical Decision Making/ A&P                                 Medical Decision Making Amount and/or Complexity of Data Reviewed Labs: ordered. Radiology: ordered.  Risk Decision regarding hospitalization.   Medical Decision Making:   MASIAH PURGASON is a 89 y.o. female who presented to the ED today with altered mental status.  All signs reviewed.  On exam she has right-sided gaze preference and appears to have left-sided neglect.  She is also confused.  Her last known well is approximate 8 AM this morning.  She is outside the TNK window but she has findings concerning for neglect and possible field cut which would put her at the LVO category.  Code stroke activated.  Sugar is normal.  Consider possible seizure as well but she is talking throughout this.   Patient placed on continuous vitals and telemetry monitoring while in ED which was reviewed periodically.  Reviewed and confirmed nursing documentation for past medical history, family history, social history.  Reassessment and Plan:   Patient remained stable.  Neurology evaluated and trialed Ativan and recommended loading with Keppra for possible seizure.  CT CTA unremarkable.  Neurology recommends metabolic workup and MRI.  Discussed with hospitalist and admitted.   Patient's presentation is most consistent with acute complicated illness / injury requiring diagnostic workup.           Final Clinical Impression(s) / ED Diagnoses Final diagnoses:  Altered mental status, unspecified altered mental status type    Rx / DC  Orders ED Discharge Orders     None         Laurence Spates, MD 06/14/23 2357

## 2023-06-14 NOTE — ED Notes (Signed)
ED TO INPATIENT HANDOFF REPORT  ED Nurse Name and Phone #: Ranell Patrick [phone]  S Name/Age/Gender Michelle [name] 89 y.o. female Room/Bed: 027C/027C  Code Status   Code Status: Limited: Do not attempt resuscitation (DNR) -DNR-LIMITED -Do Not Intubate/DNI   Home/SNF/Other Home Patient oriented to: self Is this baseline?  Pt has hx of dementia  Triage Complete: Triage complete  Chief Complaint AMS (altered mental status) [R41.82]  Triage Note Pt present to ED from home d/t altered mental status. Per EMS family states pt had silent seizure at 0800. Pt has hx of seizure, dementia. Pt alert to name only at this time.    EMS VS: 162/100 80 97% 157 cbg   Allergies Allergies  Allergen Reactions   Codeine Sulfate Nausea Only   Morphine Sulfate Nausea Only    Level of Care/Admitting Diagnosis ED Disposition     ED Disposition  Admit   Condition  --   Comment  Hospital [facility] [[id number]]  Level of Care: Progressive [102]  Admit to Progressive based on following criteria: NEUROLOGICAL AND NEUROSURGICAL complex patients with significant risk of instability, who do not meet ICU criteria, yet require close observation or frequent assessment (< / = every 2 - 4 hours) with medical / nursing intervention.  May admit patient to [facility] or [facility] if equivalent level of care is available:: No  Covid Evaluation: Asymptomatic - no recent exposure (last 10 days) testing not required  Diagnosis: AMS (altered mental status) [[id number]]  Admitting Physician: Nolberto Hanlon [[id number]]  Attending Physician: Nolberto Hanlon [[id number]]  Certification:: I certify this patient will need inpatient services for at least 2 midnights  Expected Medical Readiness: 06/16/2023          B Medical/Surgery History Past Medical History:  Diagnosis Date   Anginal pain (HCC)    Arthritis    AV block, 1st degree    CAD (coronary artery disease) 1990; 2015   Cardiac cath  1990 with Dr. Riley Kill and [name] Residual CAD, non obstructive per 2015 cath. STEMI status post stent in August 2015.   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   Promus DES to RCA with STEMI   Shortness of breath    Past Surgical History:  Procedure Laterality Date   CATARACT EXTRACTION  2020   right and left eye    CHOLECYSTECTOMY     thinks her appendix was removed at the same time   CORONARY ANGIOPLASTY WITH STENT PLACEMENT  02/18/14   Promus DES to RCA   LEFT HEART CATHETERIZATION WITH CORONARY ANGIOGRAM N/A 02/18/2014   Procedure: LEFT HEART CATHETERIZATION WITH CORONARY ANGIOGRAM;  Surgeon: Corky Crafts, MD;  Location: [facility] CATH LAB;  Service: Cardiovascular;  Laterality: N/A;   PTCA  1990   PTCA of RCA   TRANSTHORACIC ECHOCARDIOGRAM  02/02/2012   mild LVH, EF 55-60%, Normal WM, Gr 1 DD; Mild MR     A IV Location/Drains/Wounds Patient Lines/Drains/Airways Status     Active Line/Drains/Airways  Name Placement date Placement time Site Days   Peripheral IV 06/14/23 20 G 1" Left Antecubital 06/14/23  1737  Antecubital  less than 1   Peripheral IV 06/14/23 22 G 1" Posterior;Right Hand 06/14/23  1737  Hand  less than 1   Peripheral IV 06/14/23 20 G 1" Anterior;Proximal;Right Forearm 06/14/23  1738  Forearm  less than 1   Pressure Injury 02/07/23 Sacrum Mid Stage 2 -  Partial thickness loss of dermis presenting as a shallow open injury with a red, pink wound bed without slough. small circular area of broken skin  without drainage 02/07/23  1500  -- 127   Wound / Incision (Open or Dehisced) 08/13/20 (IAD) Incontinence Associated Dermatitis Perineum Right;Left 08/13/20  0800  Perineum  1035   Wound / Incision (Open or Dehisced) 02/06/23 Skin tear Pretibial Left 02/06/23  0530  Pretibial  128   Wound / Incision (Open or Dehisced) 02/06/23 Laceration Head Upper 02/06/23  0800  Head  128            Intake/Output Last 24 hours  Intake/Output Summary (Last 24 hours) at 06/14/2023 1910 Last data filed at 06/14/2023 1849 Gross per 24 hour  Intake 200 ml  Output --  Net 200 ml    Labs/Imaging Results for orders placed or performed during the hospital encounter of 06/14/23 (from the past 48 hour(s))  Urine rapid drug screen (hosp performed)     Status: None   Collection Time: 06/14/23  3:45 PM  Result Value Ref Range   Opiates NONE DETECTED NONE DETECTED   Cocaine NONE DETECTED NONE DETECTED   Benzodiazepines NONE DETECTED NONE DETECTED   Amphetamines NONE DETECTED NONE DETECTED   Tetrahydrocannabinol NONE DETECTED NONE DETECTED   Barbiturates NONE DETECTED NONE DETECTED    Comment: (NOTE) DRUG SCREEN FOR MEDICAL PURPOSES ONLY.  IF CONFIRMATION IS NEEDED FOR ANY PURPOSE, NOTIFY LAB WITHIN 5 DAYS.  LOWEST DETECTABLE LIMITS FOR URINE DRUG SCREEN Drug Class                     Cutoff (ng/mL) Amphetamine and metabolites    1000 Barbiturate and metabolites    200 Benzodiazepine                 200 Opiates and metabolites        300 Cocaine and metabolites        300 THC                            50 Performed at [facility] Lab, [address] [place], [place] [address]   Urinalysis, Routine w reflex microscopic -Urine, Catheterized     Status: Abnormal   Collection Time: 06/14/23  3:45 PM  Result Value Ref Range   Color, Urine YELLOW YELLOW   APPearance HAZY (A) CLEAR   Specific Gravity, Urine 1.017 1.005 - 1.030   pH 5.0 5.0 - 8.0   Glucose, UA NEGATIVE NEGATIVE mg/dL   Hgb  urine dipstick NEGATIVE NEGATIVE   Bilirubin Urine NEGATIVE NEGATIVE   Ketones, ur NEGATIVE NEGATIVE mg/dL   Protein, ur 30 (A) NEGATIVE mg/dL   Nitrite NEGATIVE NEGATIVE   Leukocytes,Ua NEGATIVE NEGATIVE   RBC / HPF 0-5 0 - 5 RBC/hpf   WBC, UA 0-5 0 - 5 WBC/hpf   Bacteria, UA RARE (A) NONE SEEN   Squamous Epithelial / HPF 6-10 0 - 5 /HPF   Mucus PRESENT  Comment: Performed at [facility], [address] [place], [place] [address]  CBG monitoring, ED     Status: Abnormal   Collection Time: 06/14/23  4:16 PM  Result Value Ref Range   Glucose-Capillary 137 (H) 70 - 99 mg/dL    Comment: Glucose reference range applies only to samples taken after fasting for at least 8 hours.   Comment 1 Document in Chart   Ethanol     Status: None   Collection Time: 06/14/23  4:30 PM  Result Value Ref Range   Alcohol, Ethyl (B) <10 <10 mg/dL    Comment: (NOTE) Lowest detectable limit for serum alcohol is 10 mg/dL.  For medical purposes only. Performed at [facility], [address], [place], [place] [address]   Protime-INR     Status: None   Collection Time: 06/14/23  4:30 PM  Result Value Ref Range   Prothrombin Time 14.0 11.4 - 15.2 seconds   INR 1.1 0.8 - 1.2    Comment: (NOTE) INR goal varies based on device and disease states. Performed at [facility], [address] [place], [place] [address]   APTT     Status: Abnormal   Collection Time: 06/14/23  4:30 PM  Result Value Ref Range   aPTT 22 (L) 24 - 36 seconds    Comment: REPEATED TO VERIFY Performed at [facility], [address], [place], [place] [address]   Comprehensive metabolic panel     Status: Abnormal   Collection Time: 06/14/23  4:30 PM  Result Value Ref Range   Sodium 136 135 - 145 mmol/L   Potassium 5.5 (H) 3.5 - 5.1 mmol/L    Comment: HEMOLYSIS AT THIS LEVEL MAY AFFECT RESULT   Chloride 103 98 - 111 mmol/L   CO2 22 22 - 32 mmol/L   Glucose, Bld 125 (H) 70 - 99 mg/dL    Comment:  Glucose reference range applies only to samples taken after fasting for at least 8 hours.   BUN 25 (H) 8 - 23 mg/dL   Creatinine, Ser 6.57 0.44 - 1.00 mg/dL   Calcium 9.8 8.9 - 84.6 mg/dL   Total Protein 7.3 6.5 - 8.1 g/dL   Albumin 3.6 3.5 - 5.0 g/dL   AST 28 15 - 41 U/L    Comment: HEMOLYSIS AT THIS LEVEL MAY AFFECT RESULT   ALT 16 0 - 44 U/L    Comment: HEMOLYSIS AT THIS LEVEL MAY AFFECT RESULT   Alkaline Phosphatase 44 38 - 126 U/L   Total Bilirubin 0.7 <1.2 mg/dL    Comment: HEMOLYSIS AT THIS LEVEL MAY AFFECT RESULT   GFR, Estimated >60 >60 mL/min    Comment: (NOTE) Calculated using the CKD-EPI Creatinine Equation (2021)    Anion gap 11 5 - 15    Comment: Performed at [facility] Lab, [address] [place], [place] [address]  CBC WITH DIFFERENTIAL     Status: None   Collection Time: 06/14/23  4:30 PM  Result Value Ref Range   WBC 7.7 4.0 - 10.5 K/uL   RBC 4.87 3.87 - 5.11 MIL/uL   Hemoglobin 12.9 12.0 - 15.0 g/dL   HCT 28.4 13.2 - 44.0 %   MCV 85.6 80.0 - 100.0 fL   MCH 26.5 26.0 - 34.0 pg   MCHC 30.9 30.0 - 36.0 g/dL   RDW 10.2 72.5 - 36.6 %   Platelets 191 150 - 400 K/uL   nRBC 0.0  0.0 - 0.2 %   Neutrophils Relative % 75 %   Neutro Abs 5.8 1.7 - 7.7 K/uL   Lymphocytes Relative 19 %   Lymphs Abs 1.5 0.7 - 4.0 K/uL   Monocytes Relative 4 %   Monocytes Absolute 0.3 0.1 - 1.0 K/uL   Eosinophils Relative 1 %   Eosinophils Absolute 0.1 0.0 - 0.5 K/uL   Basophils Relative 1 %   Basophils Absolute 0.0 0.0 - 0.1 K/uL   Immature Granulocytes 0 %   Abs Immature Granulocytes 0.03 0.00 - 0.07 K/uL    Comment: Performed at [facility] Lab, [address], [place], [place] [address]  I-stat chem 8, ED     Status: Abnormal   Collection Time: 06/14/23  4:37 PM  Result Value Ref Range   Sodium 136 135 - 145 mmol/L   Potassium 5.8 (H) 3.5 - 5.1 mmol/L   Chloride 108 98 - 111 mmol/L   BUN 38 (H) 8 - 23 mg/dL   Creatinine, Ser 6.04 0.44 - 1.00 mg/dL   Glucose, Bld 540  (H) 70 - 99 mg/dL    Comment: Glucose reference range applies only to samples taken after fasting for at least 8 hours.   Calcium, Ion 1.04 (L) 1.15 - 1.40 mmol/L   TCO2 22 22 - 32 mmol/L   Hemoglobin 13.9 12.0 - 15.0 g/dL   HCT 98.1 19.1 - 47.8 %  I-Stat CG4 Lactic Acid, ED     Status: None   Collection Time: 06/14/23  4:38 PM  Result Value Ref Range   Lactic Acid, Venous 1.7 0.5 - 1.9 mmol/L   *Note: Due to a large number of results and/or encounters for the requested time period, some results have not been displayed. A complete set of results can be found in Results Review.   DG CHEST PORT 1 VIEW  Result Date: 06/14/2023 CLINICAL DATA:  Cough and altered mental status. EXAM: PORTABLE CHEST 1 VIEW COMPARISON:  May 17, 2023 FINDINGS: The heart size and mediastinal contours are within normal limits. There is marked severity calcification of the thoracic aorta. Mild, diffuse, chronic appearing increased lung markings are seen with mild atelectatic changes suspected within the left lung base. No pleural effusion or pneumothorax is identified. Multilevel degenerative changes seen throughout the thoracic spine. IMPRESSION: Chronic appearing increased lung markings with mild left basilar atelectasis. Electronically Signed   By: Aram Candela M.D.   On: 06/14/2023 18:36   CT ANGIO HEAD NECK W WO CM W PERF (CODE STROKE)  Result Date: 06/14/2023 CLINICAL DATA:  Altered mental status. Seizure at 8 o'clock a.m. Dementia. EXAM: CT ANGIOGRAPHY HEAD AND NECK CT PERFUSION BRAIN TECHNIQUE: Multidetector CT imaging of the head and neck was performed using the standard protocol during bolus administration of intravenous contrast. Multiplanar CT image reconstructions and MIPs were obtained to evaluate the vascular anatomy. Carotid stenosis measurements (when applicable) are obtained utilizing NASCET criteria, using the distal internal carotid diameter as the denominator. Multiphase CT imaging of the brain  was performed following IV bolus contrast injection. Subsequent parametric perfusion maps were calculated using RAPID software. RADIATION DOSE REDUCTION: This exam was performed according to the departmental dose-optimization program which includes automated exposure control, adjustment of the mA and/or kV according to patient size and/or use of iterative reconstruction technique. CONTRAST:  OMNIPAQUE IOHEXOL 350 MG/ML SOLN COMPARISON:  CT head without contrast 05/17/2023 MR head without contrast 06/25/2020. FINDINGS: CTA NECK FINDINGS Aortic arch: Study is moderately degraded by  patient motion. Atherosclerotic changes are present at the aortic arch and great vessel origins. A 50% stenosis at the origin of the left subclavian is suspected. No other definite stenosis are present. Right carotid system: The right common carotid artery is within normal limits. Bifurcation demonstrates some atherosclerotic change without focal stenosis. Mild tortuosity is present cervical right ICA without significant stenosis of greater than 50%. Left carotid system: The left common carotid artery is within normal limits. More pronounced atherosclerotic calcifications are present bifurcation proximal left ICA without significant stenosis. Mild tortuosity is present in the cervical left ICA without significant stenosis. Vertebral arteries: The vertebral arteries are not well seen in the V1 or V2 segments due to patient motion. The more distal cervical vertebral arteries are within normal limits bilaterally. Skeleton: Multilevel degenerative changes are present cervical spine. Ankylosis with grade 1 anterolisthesis is present at C6-7. No focal osseous lesions are present. Other neck: Soft tissues the neck are otherwise unremarkable. Salivary glands are within normal limits. Thyroid is normal. No significant adenopathy is present. No focal mucosal or submucosal lesions are present. Upper chest: The lung apices are clear. The thoracic  inlet is within normal limits. Review of the MIP images confirms the above findings CTA HEAD FINDINGS Anterior circulation: Atherosclerotic calcifications are present within the cavernous internal carotid arteries bilaterally without significant stenosis through the ICA termini. The right A1 segment is aplastic. Both A2 segments fill from the left. M1 segments are normal bilaterally. The MCA bifurcations are normal. The ACA and MCA branch vessels are within normal limits. No aneurysm is present. Posterior circulation: PICA origins are visualized and normal. The vertebrobasilar junction basilar artery normal. The superior cerebellar arteries are patent. Both posterior cerebral arteries originate from basilar tip. The PCA branch vessels are normal bilaterally. No aneurysm is present. Venous sinuses: The dural sinuses are patent. The straight sinus and deep cerebral veins are intact. Cortical veins are within normal limits. No significant vascular malformation is evident. Anatomic variants: None Review of the MIP images confirms the above findings CT Brain Perfusion Findings: ASPECTS: 10/10 CBF (<30%) Volume: 0mL Perfusion (Tmax>6.0s) volume: 0mL IMPRESSION: 1. No emergent large vessel occlusion. 2. Atherosclerotic changes at the carotid bifurcations and cavernous internal carotid arteries bilaterally without significant stenosis. 3. The vertebral arteries are not well seen in the V1 or V2 segments due to patient motion. The more distal cervical vertebral arteries are within normal limits bilaterally. 4. A 50% stenosis at the origin of the left subclavian is suspected. 5. Multilevel degenerative changes of the cervical spine. 6. Ankylosis with grade 1 anterolisthesis at C6-7. 7. Normal CT perfusion. 8.  Aortic Atherosclerosis (ICD10-I70.0). Electronically Signed   By: Marin Roberts M.D.   On: 06/14/2023 17:27   CT HEAD CODE STROKE WO CONTRAST  Result Date: 06/14/2023 CLINICAL DATA:  Code stroke. Right-sided  gaze. Left-sided weakness. EXAM: CT HEAD WITHOUT CONTRAST TECHNIQUE: Contiguous axial images were obtained from the base of the skull through the vertex without intravenous contrast. RADIATION DOSE REDUCTION: This exam was performed according to the departmental dose-optimization program which includes automated exposure control, adjustment of the mA and/or kV according to patient size and/or use of iterative reconstruction technique. COMPARISON:  CT head without contrast 05/17/2023 FINDINGS: Brain: Mild generalized atrophy and white matter disease is stable. No acute infarct, hemorrhage, or mass lesion is present. The ventricles are proportionate to the degree of atrophy. Deep brain nuclei are within normal limits. Insular cortex is within normal limits bilaterally. The brainstem and  cerebellum are within normal limits. Midline structures are within normal limits. Vascular: Atherosclerotic calcifications are present within the cavernous internal carotid arteries and at the dural margin left vertebral artery. No hyperdense vessel is present. Skull: Calvarium is intact. No focal lytic or blastic lesions are present. No significant extracranial soft tissue lesion is present. Sinuses/Orbits: The paranasal sinuses and mastoid air cells are clear. Bilateral lens replacements are noted. Globes and orbits are otherwise unremarkable. ASPECTS [facility] Stroke Program Early CT Score) - Ganglionic level infarction (caudate, lentiform nuclei, internal capsule, insula, M1-M3 cortex): 7/7 - Supraganglionic infarction (M4-M6 cortex): 3/3 Total score (0-10 with 10 being normal): 10/10 IMPRESSION: 1. No acute intracranial abnormality or significant interval change. 2. Stable atrophy and white matter disease. This likely reflects the sequela of chronic microvascular ischemia. 3. Aspects is 10/10. The above was relayed via text pager to Dr. Marthe Patch on 06/14/2023 at 16:49 . Electronically Signed   By: Marin Roberts M.D.   On:  06/14/2023 16:49    Pending Labs Unresulted Labs (From admission, onward)     Start     Ordered   06/21/23 0500  Creatinine, serum  (enoxaparin (LOVENOX)    CrCl >/= 30 ml/min)  Weekly,   R     Comments: while on enoxaparin therapy    06/14/23 1901   06/15/23 0500  APTT  Tomorrow morning,   R        06/14/23 1901   06/15/23 0500  Protime-INR  Tomorrow morning,   R        06/14/23 1901   06/15/23 0500  Basic metabolic panel  Tomorrow morning,   R        06/14/23 1901   06/15/23 0500  CBC  Tomorrow morning,   R        06/14/23 1901   06/14/23 1857  CBC  (enoxaparin (LOVENOX)    CrCl >/= 30 ml/min)  Once,   R       Comments: Baseline for enoxaparin therapy IF NOT ALREADY DRAWN.  Notify MD if PLT < 100 K.    06/14/23 1901   06/14/23 1857  Creatinine, serum  (enoxaparin (LOVENOX)    CrCl >/= 30 ml/min)  Once,   R       Comments: Baseline for enoxaparin therapy IF NOT ALREADY DRAWN.    06/14/23 1901   06/14/23 1853  Blood gas, arterial  ONCE - URGENT,   URGENT        06/14/23 1901   06/14/23 1615  CBC  (Stroke Panel (PNL))  ONCE - STAT,   STAT        06/14/23 1619   06/14/23 1615  Differential  (Stroke Panel (PNL))  ONCE - STAT,   URGENT        06/14/23 1619            Vitals/Pain Today's Vitals   06/14/23 1720 06/14/23 1730 06/14/23 1800 06/14/23 1830  BP: (!) 186/78 (!) 169/92 (!) 144/133 (!) 156/72  Pulse: 89 89 82 91  Resp: 19 (!) 33 (!) 27 (!) 24  Temp:      TempSrc:      SpO2: 98% 99% 98% 97%  Weight:      Height:      PainSc:        Isolation Precautions No active isolations  Medications Medications  enoxaparin (LOVENOX) injection 40 mg (has no administration in time range)  acetaminophen (TYLENOL) tablet 650 mg (has no administration in time range)  Or  acetaminophen (TYLENOL) suppository 650 mg (has no administration in time range)  polyethylene glycol (MIRALAX / GLYCOLAX) packet 17 g (has no administration in time range)  LORazepam (ATIVAN)  injection 1 mg (1 mg Intravenous Given 06/14/23 1700)  LORazepam (ATIVAN) injection 1 mg (1 mg Intravenous Given 06/14/23 1724)  iohexol (OMNIPAQUE) 350 MG/ML injection 100 mL (100 mLs Intravenous Contrast Given 06/14/23 1718)  levETIRAcetam (KEPPRA) IVPB 1000 mg/100 mL premix (0 mg Intravenous Stopped 06/14/23 1849)    Mobility walks     Focused Assessments Neuro Assessment Handoff:  Swallow screen pass?  Deferred at this time, d/t ativan given Cardiac Rhythm: Normal sinus rhythm NIH Stroke Scale  Interval: Initial Level of Consciousness (1a.)   : Not alert, but arousable by minor stimulation to obey, answer, or respond LOC Questions (1b. )   : Answers neither question correctly LOC Commands (1c. )   : Performs both tasks correctly Best Gaze (2. )  : Partial gaze palsy Visual (3. )  : Complete hemianopia Facial Palsy (4. )    : Normal symmetrical movements Motor Arm, Left (5a. )   : Drift Motor Arm, Right (5b. ) : Drift Motor Leg, Left (6a. )  : Some effort against gravity Motor Leg, Right (6b. ) : Some effort against gravity Limb Ataxia (7. ): Absent Sensory (8. )  : Normal, no sensory loss Best Language (9. )  : No aphasia Dysarthria (10. ): Normal Extinction/Inattention (11.)   : No Abnormality Complete NIHSS TOTAL: 12 Last date known well: 06/14/23 Last time known well: 0800 Neuro Assessment:   Neuro Checks:   Initial (06/14/23 1700)  Has TPA been given? No If patient is a Neuro Trauma and patient is going to OR before floor call report to 4N Charge nurse: [phone] or [phone]   R Recommendations: See Admitting Provider Note  Report given to:   Additional Notes:

## 2023-06-14 NOTE — H&P (Signed)
History and Physical    Patient: Michelle [name] DOB: [birth year] DOA: 06/14/2023 DOS: the patient was seen and examined on 06/14/2023 PCP: Shelva Majestic, MD  Patient coming from: Home  Chief Complaint:  Chief Complaint  Patient presents with   Altered Mental Status   HPI: Michelle [name] is a 89 y.o. female with medical history significant of known seizure disorder as well as dementia.  Patient requires a wheelchair assistance to ambulate.  However is often able to transfer on her own.  Patient seems to have been having normal state of health when she woke up this morning and she went to the bathroom under the supervision of one of her sons who is the primary historian for this encounter, [name].  Once the patient was done with the bathroom and getting helped back into the wheelchair, patient had an episode of grimacing of her face and changing her gaze from left to right a few times which lasted for about 30 seconds to a minute.  There thereafter patient became nonresponsive in terms of verbal stimulation.  However patient remained awake, looking upfront and seated in the chair.  Patient did not pass out.  There is no report of patient having incontinence tongue injury or shaking of the extremities.  Over the course of the morning in the afternoon patient became progressively unresponsive and lethargic, prompting the patient to be brought to the ER.  There is no report of active seizure observation in the ER by the attending.  Per report from the ER attending, initial exam was concerning for left-sided neglect.  There seems to have been a concern that the patient may be having active seizure and patient received 2 mg of Ativan as well as Keppra in the ER.  Patient's ER course is notable for progressive lethargy and at this time patient is pretty much not responsive.  Medical evaluation is sought.  There is no history of patient having nausea vomiting diarrhea fever  headache. Review of Systems: unable to review all systems due to the inability of the patient to answer questions. Past Medical History:  Diagnosis Date   Anginal pain (HCC)    Arthritis    AV block, 1st degree    CAD (coronary artery disease) 1990; 2015   Cardiac cath 1990 with Dr. Riley Kill and [name] Residual CAD, non obstructive per 2015 cath. STEMI status post stent in August 2015.   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   Promus DES to RCA with STEMI   Shortness of breath    Past Surgical History:  Procedure Laterality Date   CATARACT EXTRACTION  2020   right and left eye    CHOLECYSTECTOMY     thinks her appendix was removed at the same time   CORONARY ANGIOPLASTY WITH STENT PLACEMENT  02/18/14   Promus DES to RCA   LEFT HEART CATHETERIZATION WITH CORONARY ANGIOGRAM N/A 02/18/2014   Procedure: LEFT HEART CATHETERIZATION WITH CORONARY ANGIOGRAM;  Surgeon: Corky Crafts, MD;  Location:  [facility] CATH LAB;  Service: Cardiovascular;  Laterality: N/A;   PTCA  1990   PTCA of RCA   TRANSTHORACIC ECHOCARDIOGRAM  02/02/2012   mild LVH, EF 55-60%, Normal WM, Gr 1 DD; Mild MR   Social History:  reports that she quit smoking about 16 years ago. Her smoking use included cigarettes. She started smoking about 71 years ago. She has a 55 pack-year smoking history. She has never used smokeless tobacco. She reports that she does not drink alcohol and does not use drugs.  Allergies  Allergen Reactions   Codeine Sulfate Nausea Only    Morphine Sulfate Nausea Only    Family History  Problem Relation Age of Onset   Heart attack Mother 26   Cancer Father 74   Cancer Maternal Grandfather    Cancer Paternal Grandmother    Cancer Paternal Grandfather    Diabetes Son    Liver cancer Brother    Bone cancer Brother    Heart attack Son    Hypertension Son    Diabetes Son    Hyperlipidemia Son    Colon cancer Neg Hx    Ovarian cancer Neg Hx    Uterine cancer Neg Hx     Prior to Admission medications   Medication Sig Start Date End Date Taking? Authorizing Provider  acetaminophen (TYLENOL) 650 MG CR tablet Take 1,300 mg by mouth 3 (three) times daily.    [provider]  albuterol (PROVENTIL) (2.5 MG/3ML) 0.083% nebulizer solution USE THREE MILLILITERS VIA NEBULIZATION BY MOUTH EVERY 6 HOURS AS NEEDED FOR WHEEZING OR SHORTNESS OF BREATH Patient taking differently: Take 2.5 mg by nebulization every 6 (six) hours as needed for wheezing or shortness of breath. 10/12/21   Shelva Majestic, MD  albuterol (VENTOLIN HFA) 108 (90 Base) MCG/ACT inhaler INHALE 1 PUFF EVERY 4 HOURS AS NEEDED FOR WHEEZING, SHORTNESS OF BREATH (RESCUE INHALER IF ADVAIR NOT WORKING) Patient taking differently: Inhale 2 puffs into the lungs as needed. 03/29/23   Shelva Majestic, MD  amLODipine (NORVASC) 2.5 MG tablet TAKE 1 TABLET EVERY MORNING. IF AFTERNOON BLOOD PRESSURE IS OVER 145/90 TAKE SECOND TABLET. 06/05/23   Shelva Majestic, MD  aspirin 81 MG chewable tablet Chew 81 mg by mouth daily.    [provider]  blood glucose meter kit and supplies KIT Dispense based on patient and insurance preference. Use up to four times daily as directed. Dx:E11.9 04/01/21   Shelva Majestic, MD  calcium carbonate (OSCAL) 1500 (600 Ca) MG TABS tablet Take 600 mg of elemental calcium by mouth daily.    [provider]  carvedilol (COREG) 12.5 MG tablet Take 1 tablet (12.5 mg total) by mouth 2 (two) times daily. 12/06/22   Alver Sorrow,  NP  cholecalciferol (VITAMIN D3) 25 MCG (1000 UNIT) tablet Take 1,000 Units by mouth daily with breakfast.    [provider]  clopidogrel (PLAVIX) 75 MG tablet TAKE 1 TABLET EVERY DAY Patient taking differently: Take 75 mg by mouth daily. 11/23/22   Shelva Majestic, MD  diazePAM, 15 MG Dose, (VALTOCO 15 MG DOSE) 2 x 7.5 MG/0.1ML LQPK Place 15 mg into the nose as needed (seizure lasting more than 2 minutes). 06/12/23   Shelva Majestic, MD  diclofenac Sodium (VOLTAREN) 1 % GEL Apply 2 g topically 4 (four) times daily. 03/22/23   Shelva Majestic, MD  enalapril (VASOTEC) 20 MG tablet Take 1 tablet (20 mg total) by mouth at bedtime. Patient taking differently:  Take 20 mg by mouth at bedtime. 03/23/23   Alver Sorrow, NP  fenofibrate micronized (LOFIBRA) 134 MG capsule TAKE 1 CAPSULE EVERY DAY 12/27/22   Shelva Majestic, MD  ferrous sulfate 325 (65 FE) MG tablet Twice a week Patient taking differently: Take 325 mg by mouth 2 (two) times a week. Tuesday  and Friday 02/21/20   Shelva Majestic, MD  fluticasone-salmeterol (ADVAIR) 250-50 MCG/ACT AEPB INHALE 1 PUFF TWICE DAILY 09/12/22   Shelva Majestic, MD  folic acid (FOLVITE) 400 MCG tablet Take 400 mcg by mouth at bedtime.    [provider]  furosemide (LASIX) 20 MG tablet Take 1 tablet (20 mg total) by mouth as needed for fluid or edema. 03/23/23   Alver Sorrow, NP  icosapent Ethyl (VASCEPA) 1 g capsule Take 2 capsules (2 g total) by mouth 2 (two) times daily. Patient taking differently: Take 1-2 g by mouth See admin instructions. Take 1 g gram in the morning and afternoon. Take 2 g at bedtime 12/06/22   Alver Sorrow, NP  lamoTRIgine (LAMICTAL) 25 MG tablet Take 1 tablet (25 mg total) by mouth in the morning AND 2 tablets (50 mg total) at bedtime. 05/26/23   Narda Bonds, MD  Lancets Misc. (ACCU-CHEK FASTCLIX LANCET) KIT Use as directed to test blood sugar 11/13/20   Shelva Majestic, MD  loperamide (IMODIUM A-D) 2 MG  tablet Take 2-4 mg by mouth 2 (two) times daily as needed for diarrhea or loose stools.    [provider]  loratadine (CLARITIN) 10 MG tablet Take 10 mg by mouth every other day.    [provider]  medroxyPROGESTERone (PROVERA) 10 MG tablet Take 1 tablet (10 mg total) by mouth daily. Patient taking differently: Take 10 mg by mouth at bedtime. 09/12/22   Cross, Efraim Kaufmann D, NP  nitroGLYCERIN (NITROSTAT) 0.4 MG SL tablet DISSOLVE 1 TAB UNDER TONGUE FOR CHEST PAIN - IF PAIN REMAINS AFTER 5 MIN, CALL 911 AND REPEAT DOSE. MAX 3 TABS IN 15 MINUTES Patient taking differently: Place 0.4 mg under the tongue every 5 (five) minutes as needed for chest pain. DISSOLVE 1 TAB UNDER TONGUE FOR CHEST PAIN - IF PAIN REMAINS AFTER 5 MIN, CALL 911 AND REPEAT DOSE. MAX 3 TABS IN 15 MINUTES 01/17/23   Chilton Si, MD  pantoprazole (PROTONIX) 40 MG tablet TAKE 1 TABLET EVERY DAY 05/02/23   Shelva Majestic, MD  psyllium (METAMUCIL) 58.6 % packet Take 1 packet by mouth as needed (fiber).    [provider]  QUEtiapine (SEROQUEL) 25 MG tablet Take 1 tablet (25 mg total) by mouth every evening. Between 1830 and 1900 05/26/23   Narda Bonds, MD  rosuvastatin (CRESTOR) 40 MG tablet Take 1 tablet (40 mg total) by mouth daily. 05/17/23   Shelva Majestic, MD  sertraline (ZOLOFT) 50 MG tablet TAKE 2 TABLETS AT BEDTIME Patient taking differently: Take 100 mg by mouth at bedtime. 10/13/22   Shelva Majestic, MD  SPIRIVA HANDIHALER 18 MCG inhalation capsule PLACE 1 CAPSULE INTO HANDIHALER AND INHALE THE CONTENTS DAILY Patient taking differently: Place 18 mcg into inhaler and inhale daily. 11/23/22   Shelva Majestic, MD  TRUE METRIX BLOOD GLUCOSE TEST test strip TEST BLOOD SUGAR UP TO FOUR TIMES DAILY AS DIRECTED 04/17/23   Shelva Majestic, MD  TRUEplus Lancets 33G MISC TEST BLOOD SUGAR UP TO FOUR TIMES DAILY AS DIRECTED 04/17/23   Shelva Majestic, MD  vitamin B-12 (CYANOCOBALAMIN) 1000 MCG tablet  Take 1,000 mcg by mouth daily with breakfast.    [provider]  vitamin E 400 UNIT capsule Take 400 Units by mouth at bedtime.    [provider]   As per the patient's son, patient's medication history from Dr.'s [facility] office on June 12, 2023 is accurate.  Except for the fact that the patient was restarted on nortriptyline 10 mg about 3 days ago, although it was stopped at the time of her last discharge on May 26, 2023. Physical Exam: Vitals:   06/14/23 1630 06/14/23 1720 06/14/23 1730 06/14/23 1830  BP: (!) 164/87 (!) 186/78 (!) 169/92 (!) 156/72  Pulse: 86 89 89 91  Resp: (!) 8 19 (!) 33 (!) 24  Temp:      TempSrc:      SpO2: 100% 98% 99% 97%  Weight:      Height:       General: Patient keeps eyes closed, appears to be in deep sleep, not reasonably arousable with usual tactile or verbal stimulation.  However patient does withdraw to noxious stimulation and I do not appreciate any obvious focal motor deficit.  Patient is on room air. Respiratory exam: Bilateral intravesicular, limited excursion, limited exam Cardiovascular exam S1-S2 normal Abdomen all quadrants are soft nontender Extremities warm without edema. Data Reviewed:  Labs on Admission:  Results for orders placed or performed during the hospital encounter of 06/14/23 (from the past 24 hour(s))  Urine rapid drug screen (hosp performed)     Status: None   Collection Time: 06/14/23  3:45 PM  Result Value Ref Range   Opiates NONE DETECTED NONE DETECTED   Cocaine NONE DETECTED NONE DETECTED   Benzodiazepines NONE DETECTED NONE DETECTED   Amphetamines NONE DETECTED NONE DETECTED   Tetrahydrocannabinol NONE DETECTED NONE DETECTED   Barbiturates NONE DETECTED NONE DETECTED  Urinalysis, Routine w reflex microscopic -Urine, Catheterized     Status: Abnormal   Collection Time: 06/14/23  3:45 PM  Result Value Ref Range   Color, Urine YELLOW YELLOW   APPearance HAZY (A) CLEAR   Specific Gravity,  Urine 1.017 1.005 - 1.030   pH 5.0 5.0 - 8.0   Glucose, UA NEGATIVE NEGATIVE mg/dL   Hgb urine dipstick NEGATIVE NEGATIVE   Bilirubin Urine NEGATIVE NEGATIVE   Ketones, ur NEGATIVE NEGATIVE mg/dL   Protein, ur 30 (A) NEGATIVE mg/dL   Nitrite NEGATIVE NEGATIVE   Leukocytes,Ua NEGATIVE NEGATIVE   RBC / HPF 0-5 0 - 5 RBC/hpf   WBC, UA 0-5 0 - 5 WBC/hpf   Bacteria, UA RARE (A) NONE SEEN   Squamous Epithelial / HPF 6-10 0 - 5 /HPF   Mucus PRESENT   CBG monitoring, ED     Status: Abnormal   Collection Time: 06/14/23  4:16 PM  Result Value Ref Range   Glucose-Capillary 137 (H) 70 - 99 mg/dL   Comment 1 Document in Chart   Ethanol     Status: None   Collection Time: 06/14/23  4:30 PM  Result Value Ref Range   Alcohol, Ethyl (B) <10 <10 mg/dL  Protime-INR     Status: None   Collection Time: 06/14/23  4:30 PM  Result Value Ref Range   Prothrombin Time 14.0 11.4 - 15.2 seconds   INR 1.1 0.8 - 1.2  APTT     Status: Abnormal   Collection Time: 06/14/23  4:30 PM  Result Value Ref Range   aPTT 22 (L) 24 - 36  seconds  Comprehensive metabolic panel     Status: Abnormal   Collection Time: 06/14/23  4:30 PM  Result Value Ref Range   Sodium 136 135 - 145 mmol/L   Potassium 5.5 (H) 3.5 - 5.1 mmol/L   Chloride 103 98 - 111 mmol/L   CO2 22 22 - 32 mmol/L   Glucose, Bld 125 (H) 70 - 99 mg/dL   BUN 25 (H) 8 - 23 mg/dL   Creatinine, Ser 2.53 0.44 - 1.00 mg/dL   Calcium 9.8 8.9 - 66.4 mg/dL   Total Protein 7.3 6.5 - 8.1 g/dL   Albumin 3.6 3.5 - 5.0 g/dL   AST 28 15 - 41 U/L   ALT 16 0 - 44 U/L   Alkaline Phosphatase 44 38 - 126 U/L   Total Bilirubin 0.7 <1.2 mg/dL   GFR, Estimated >40 >34 mL/min   Anion gap 11 5 - 15  CBC WITH DIFFERENTIAL     Status: None   Collection Time: 06/14/23  4:30 PM  Result Value Ref Range   WBC 7.7 4.0 - 10.5 K/uL   RBC 4.87 3.87 - 5.11 MIL/uL   Hemoglobin 12.9 12.0 - 15.0 g/dL   HCT 74.2 59.5 - 63.8 %   MCV 85.6 80.0 - 100.0 fL   MCH 26.5 26.0 - 34.0 pg    MCHC 30.9 30.0 - 36.0 g/dL   RDW 75.6 43.3 - 29.5 %   Platelets 191 150 - 400 K/uL   nRBC 0.0 0.0 - 0.2 %   Neutrophils Relative % 75 %   Neutro Abs 5.8 1.7 - 7.7 K/uL   Lymphocytes Relative 19 %   Lymphs Abs 1.5 0.7 - 4.0 K/uL   Monocytes Relative 4 %   Monocytes Absolute 0.3 0.1 - 1.0 K/uL   Eosinophils Relative 1 %   Eosinophils Absolute 0.1 0.0 - 0.5 K/uL   Basophils Relative 1 %   Basophils Absolute 0.0 0.0 - 0.1 K/uL   Immature Granulocytes 0 %   Abs Immature Granulocytes 0.03 0.00 - 0.07 K/uL  I-stat chem 8, ED     Status: Abnormal   Collection Time: 06/14/23  4:37 PM  Result Value Ref Range   Sodium 136 135 - 145 mmol/L   Potassium 5.8 (H) 3.5 - 5.1 mmol/L   Chloride 108 98 - 111 mmol/L   BUN 38 (H) 8 - 23 mg/dL   Creatinine, Ser 1.88 0.44 - 1.00 mg/dL   Glucose, Bld 416 (H) 70 - 99 mg/dL   Calcium, Ion 6.06 (L) 1.15 - 1.40 mmol/L   TCO2 22 22 - 32 mmol/L   Hemoglobin 13.9 12.0 - 15.0 g/dL   HCT 30.1 60.1 - 09.3 %  I-Stat CG4 Lactic Acid, ED     Status: None   Collection Time: 06/14/23  4:38 PM  Result Value Ref Range   Lactic Acid, Venous 1.7 0.5 - 1.9 mmol/L   *Note: Due to a large number of results and/or encounters for the requested time period, some results have not been displayed. A complete set of results can be found in Results Review.   Basic Metabolic Panel: Recent Labs  Lab 06/12/23 1433 06/14/23 1630 06/14/23 1637  NA 136 136 136  K 3.8 5.5* 5.8*  CL 102 103 108  CO2 26 22  --   GLUCOSE 118* 125* 126*  BUN 27* 25* 38*  CREATININE 0.99 0.77 0.80  CALCIUM 9.4 9.8  --    Liver Function Tests: Recent Labs  Lab 06/12/23 1433 06/14/23 1630  AST 14 28  ALT 11 16  ALKPHOS 44 44  BILITOT 0.3 0.7  PROT 6.7 7.3  ALBUMIN 3.7 3.6   No results for input(s): "LIPASE", "AMYLASE" in the last 168 hours. No results for input(s): "AMMONIA" in the last 168 hours. CBC: Recent Labs  Lab 06/12/23 1433 06/14/23 1630 06/14/23 1637  WBC 8.4 7.7  --    NEUTROABS 6.0 5.8  --   HGB 11.2* 12.9 13.9  HCT 34.7* 41.7 41.0  MCV 86.4 85.6  --   PLT 221.0 191  --    Cardiac Enzymes: No results for input(s): "CKTOTAL", "CKMB", "CKMBINDEX", "TROPONINIHS" in the last 168 hours.  BNP (last 3 results) No results for input(s): "PROBNP" in the last 8760 hours. CBG: Recent Labs  Lab 06/14/23 1616  GLUCAP 137*    Radiological Exams on Admission:  DG CHEST PORT 1 VIEW  Result Date: 06/14/2023 CLINICAL DATA:  Cough and altered mental status. EXAM: PORTABLE CHEST 1 VIEW COMPARISON:  May 17, 2023 FINDINGS: The heart size and mediastinal contours are within normal limits. There is marked severity calcification of the thoracic aorta. Mild, diffuse, chronic appearing increased lung markings are seen with mild atelectatic changes suspected within the left lung base. No pleural effusion or pneumothorax is identified. Multilevel degenerative changes seen throughout the thoracic spine. IMPRESSION: Chronic appearing increased lung markings with mild left basilar atelectasis. Electronically Signed   By: Aram Candela M.D.   On: 06/14/2023 18:36   CT ANGIO HEAD NECK W WO CM W PERF (CODE STROKE)  Result Date: 06/14/2023 CLINICAL DATA:  Altered mental status. Seizure at 8 o'clock a.m. Dementia. EXAM: CT ANGIOGRAPHY HEAD AND NECK CT PERFUSION BRAIN TECHNIQUE: Multidetector CT imaging of the head and neck was performed using the standard protocol during bolus administration of intravenous contrast. Multiplanar CT image reconstructions and MIPs were obtained to evaluate the vascular anatomy. Carotid stenosis measurements (when applicable) are obtained utilizing NASCET criteria, using the distal internal carotid diameter as the denominator. Multiphase CT imaging of the brain was performed following IV bolus contrast injection. Subsequent parametric perfusion maps were calculated using RAPID software. RADIATION DOSE REDUCTION: This exam was performed according to  the departmental dose-optimization program which includes automated exposure control, adjustment of the mA and/or kV according to patient size and/or use of iterative reconstruction technique. CONTRAST:  OMNIPAQUE IOHEXOL 350 MG/ML SOLN COMPARISON:  CT head without contrast 05/17/2023 MR head without contrast 06/25/2020. FINDINGS: CTA NECK FINDINGS Aortic arch: Study is moderately degraded by patient motion. Atherosclerotic changes are present at the aortic arch and great vessel origins. A 50% stenosis at the origin of the left subclavian is suspected. No other definite stenosis are present. Right carotid system: The right common carotid artery is within normal limits. Bifurcation demonstrates some atherosclerotic change without focal stenosis. Mild tortuosity is present cervical right ICA without significant stenosis of greater than 50%. Left carotid system: The left common carotid artery is within normal limits. More pronounced atherosclerotic calcifications are present bifurcation proximal left ICA without significant stenosis. Mild tortuosity is present in the cervical left ICA without significant stenosis. Vertebral arteries: The vertebral arteries are not well seen in the V1 or V2 segments due to patient motion. The more distal cervical vertebral arteries are within normal limits bilaterally. Skeleton: Multilevel degenerative changes are present cervical spine. Ankylosis with grade 1 anterolisthesis is present at C6-7. No focal osseous lesions are present. Other neck: Soft tissues the neck are  otherwise unremarkable. Salivary glands are within normal limits. Thyroid is normal. No significant adenopathy is present. No focal mucosal or submucosal lesions are present. Upper chest: The lung apices are clear. The thoracic inlet is within normal limits. Review of the MIP images confirms the above findings CTA HEAD FINDINGS Anterior circulation: Atherosclerotic calcifications are present within the cavernous  internal carotid arteries bilaterally without significant stenosis through the ICA termini. The right A1 segment is aplastic. Both A2 segments fill from the left. M1 segments are normal bilaterally. The MCA bifurcations are normal. The ACA and MCA branch vessels are within normal limits. No aneurysm is present. Posterior circulation: PICA origins are visualized and normal. The vertebrobasilar junction basilar artery normal. The superior cerebellar arteries are patent. Both posterior cerebral arteries originate from basilar tip. The PCA branch vessels are normal bilaterally. No aneurysm is present. Venous sinuses: The dural sinuses are patent. The straight sinus and deep cerebral veins are intact. Cortical veins are within normal limits. No significant vascular malformation is evident. Anatomic variants: None Review of the MIP images confirms the above findings CT Brain Perfusion Findings: ASPECTS: 10/10 CBF (<30%) Volume: 0mL Perfusion (Tmax>6.0s) volume: 0mL IMPRESSION: 1. No emergent large vessel occlusion. 2. Atherosclerotic changes at the carotid bifurcations and cavernous internal carotid arteries bilaterally without significant stenosis. 3. The vertebral arteries are not well seen in the V1 or V2 segments due to patient motion. The more distal cervical vertebral arteries are within normal limits bilaterally. 4. A 50% stenosis at the origin of the left subclavian is suspected. 5. Multilevel degenerative changes of the cervical spine. 6. Ankylosis with grade 1 anterolisthesis at C6-7. 7. Normal CT perfusion. 8.  Aortic Atherosclerosis (ICD10-I70.0). Electronically Signed   By: Marin Roberts M.D.   On: 06/14/2023 17:27   CT HEAD CODE STROKE WO CONTRAST  Result Date: 06/14/2023 CLINICAL DATA:  Code stroke. Right-sided gaze. Left-sided weakness. EXAM: CT HEAD WITHOUT CONTRAST TECHNIQUE: Contiguous axial images were obtained from the base of the skull through the vertex without intravenous contrast.  RADIATION DOSE REDUCTION: This exam was performed according to the departmental dose-optimization program which includes automated exposure control, adjustment of the mA and/or kV according to patient size and/or use of iterative reconstruction technique. COMPARISON:  CT head without contrast 05/17/2023 FINDINGS: Brain: Mild generalized atrophy and white matter disease is stable. No acute infarct, hemorrhage, or mass lesion is present. The ventricles are proportionate to the degree of atrophy. Deep brain nuclei are within normal limits. Insular cortex is within normal limits bilaterally. The brainstem and cerebellum are within normal limits. Midline structures are within normal limits. Vascular: Atherosclerotic calcifications are present within the cavernous internal carotid arteries and at the dural margin left vertebral artery. No hyperdense vessel is present. Skull: Calvarium is intact. No focal lytic or blastic lesions are present. No significant extracranial soft tissue lesion is present. Sinuses/Orbits: The paranasal sinuses and mastoid air cells are clear. Bilateral lens replacements are noted. Globes and orbits are otherwise unremarkable. ASPECTS [facility] Stroke Program Early CT Score) - Ganglionic level infarction (caudate, lentiform nuclei, internal capsule, insula, M1-M3 cortex): 7/7 - Supraganglionic infarction (M4-M6 cortex): 3/3 Total score (0-10 with 10 being normal): 10/10 IMPRESSION: 1. No acute intracranial abnormality or significant interval change. 2. Stable atrophy and white matter disease. This likely reflects the sequela of chronic microvascular ischemia. 3. Aspects is 10/10. The above was relayed via text pager to Dr. Marthe Patch on 06/14/2023 at 16:49 . Electronically Signed   By: Cristal Deer  Mattern M.D.   On: 06/14/2023 16:49    EKG: Independently reviewed. No peaked T waves   Assessment and Plan: * AMS (altered mental status) Following what seems to be an absence seizure by  description by family member.  CAT scan cranium is not actionable.  Report from the ER attending there was some concern of left-sided neglect on the initial evaluation in the ER.  Therefore we will do an MRI to rule out stroke. patient has received 2 mg of Ativan as well as Keppra in the ER.  To break any ongoing seizures.  Certainly the Ativan will take up till around 11 PM tonight to washout.  Will keep the patient on neurochecks.  MRI brain is pending, I will go ahead and get an EEG as well.  Urine drug screen is negative.  I will check an ABG to make sure were not dealing with occult hypoxemia or hypercapnia.  Maintain patient on telemetry.  Neurology recommends Lamictal 50 twice daily once patient is able to tolerate p.o. diet.  It is noted that the patient recently started nortriptyline 10 mg daily, it had been stopped at the time of last discharge.  This is of uncertain clinical significance.  I will continue to hold it for now.  Hyperkalemia Mild, incidental, EKG without any peaked T waves.  Hold patient's enalapril, treat with albuterol nebs and IV fluid infusion tonight. Check phos , ck    Given patient's deep somnolence I will monitor patient glucose as well as give her some dextrose infusion overnight.  Turn in position. DVT ppx ordered.  Some home meds were ordered based on patient's medication list from June 12, 2023 based on input from the family.  Kindly complete medication reconciliation after input from the pharmacy   Advance Care Planning:   Code Status: Prior DNR DNI. Advance direvtive in chart, coroborated with family at bedside - randy and [place].  Consults: neurology  Family Communication: at bedside. All questions answered.  Severity of Illness: The appropriate patient status for this patient is INPATIENT. Inpatient status is judged to be reasonable and necessary in order to provide the required intensity of service to ensure the patient's safety. The patient's  presenting symptoms, physical exam findings, and initial radiographic and laboratory data in the context of their chronic comorbidities is felt to place them at high risk for further clinical deterioration. Furthermore, it is not anticipated that the patient will be medically stable for discharge from the hospital within 2 midnights of admission.   * I certify that at the point of admission it is my clinical judgment that the patient will require inpatient hospital care spanning beyond 2 midnights from the point of admission due to high intensity of service, high risk for further deterioration and high frequency of surveillance required.*  Author: Nolberto Hanlon, MD 06/14/2023 6:50 PM  For on call review [email or web address].

## 2023-06-14 NOTE — ED Triage Notes (Signed)
Pt present to ED from home d/t altered mental status. Per EMS family states pt had silent seizure at 0800. Pt has hx of seizure, dementia. Pt alert to name only at this time.    EMS VS: 162/100 80 97% 157 cbg

## 2023-06-15 ENCOUNTER — Inpatient Hospital Stay (HOSPITAL_COMMUNITY): Payer: Medicare HMO

## 2023-06-15 DIAGNOSIS — R4182 Altered mental status, unspecified: Secondary | ICD-10-CM | POA: Diagnosis not present

## 2023-06-15 DIAGNOSIS — E875 Hyperkalemia: Secondary | ICD-10-CM | POA: Diagnosis not present

## 2023-06-15 DIAGNOSIS — R569 Unspecified convulsions: Secondary | ICD-10-CM | POA: Diagnosis not present

## 2023-06-15 LAB — APTT: aPTT: 29 s (ref 24–36)

## 2023-06-15 LAB — GLUCOSE, CAPILLARY
Glucose-Capillary: 104 mg/dL — ABNORMAL HIGH (ref 70–99)
Glucose-Capillary: 174 mg/dL — ABNORMAL HIGH (ref 70–99)
Glucose-Capillary: 162 mg/dL — ABNORMAL HIGH (ref 70–99)

## 2023-06-15 LAB — BASIC METABOLIC PANEL
Anion gap: 8 (ref 5–15)
BUN: 17 mg/dL (ref 8–23)
CO2: 26 mmol/L (ref 22–32)
Calcium: 9.5 mg/dL (ref 8.9–10.3)
Chloride: 104 mmol/L (ref 98–111)
Creatinine, Ser: 0.99 mg/dL (ref 0.44–1.00)
GFR, Estimated: 55 mL/min — ABNORMAL LOW (ref >60)
Glucose, Bld: 92 mg/dL (ref 70–99)
Potassium: 3.4 mmol/L — ABNORMAL LOW (ref 3.5–5.1)
Sodium: 138 mmol/L (ref 135–145)

## 2023-06-15 LAB — PROTIME-INR
INR: 1.2 (ref 0.8–1.2)
Prothrombin Time: 15 s (ref 11.4–15.2)

## 2023-06-15 LAB — CBC
HCT: 35.8 % — ABNORMAL LOW (ref 36.0–46.0)
Hemoglobin: 11.7 g/dL — ABNORMAL LOW (ref 12.0–15.0)
MCH: 27.2 pg (ref 26.0–34.0)
MCHC: 32.7 g/dL (ref 30.0–36.0)
MCV: 83.3 fL (ref 80.0–100.0)
Platelets: 222 10*3/uL (ref 150–400)
RBC: 4.3 MIL/uL (ref 3.87–5.11)
RDW: 15.1 % (ref 11.5–15.5)
WBC: 6.7 10*3/uL (ref 4.0–10.5)
nRBC: 0 % (ref 0.0–0.2)

## 2023-06-15 MED ORDER — CARVEDILOL 12.5 MG PO TABS: 12.5000 mg | ORAL_TABLET | Freq: Two times a day (BID) | ORAL | Status: DC

## 2023-06-15 MED ORDER — ROSUVASTATIN CALCIUM 20 MG PO TABS
40.0000 mg | ORAL_TABLET | Freq: Every day | ORAL | Status: DC
  Administered 2023-06-15 – 2023-06-18 (×4): 40 mg via ORAL
  Filled 2023-06-15 (×4): qty 2

## 2023-06-15 MED ORDER — ENALAPRIL MALEATE 10 MG PO TABS
20.0000 mg | ORAL_TABLET | Freq: Every day | ORAL | Status: DC
  Administered 2023-06-15 – 2023-06-17 (×3): 20 mg via ORAL
  Filled 2023-06-15 (×4): qty 2

## 2023-06-15 MED ORDER — MOMETASONE FURO-FORMOTEROL FUM 200-5 MCG/ACT IN AERO
2.0000 | INHALATION_SPRAY | Freq: Two times a day (BID) | RESPIRATORY_TRACT | Status: DC
  Administered 2023-06-15 – 2023-06-18 (×6): 2 via RESPIRATORY_TRACT
  Filled 2023-06-15: qty 8.8

## 2023-06-15 MED ORDER — ASPIRIN 81 MG PO CHEW: 81.0000 mg | CHEWABLE_TABLET | Freq: Every day | ORAL | Status: DC

## 2023-06-15 MED ORDER — ALBUTEROL SULFATE (2.5 MG/3ML) 0.083% IN NEBU: 2.5000 mg | INHALATION_SOLUTION | Freq: Four times a day (QID) | RESPIRATORY_TRACT | Status: DC | PRN

## 2023-06-15 MED ORDER — CLOPIDOGREL BISULFATE 75 MG PO TABS: 75.0000 mg | ORAL_TABLET | Freq: Every day | ORAL | Status: DC

## 2023-06-15 MED ORDER — UMECLIDINIUM BROMIDE 62.5 MCG/ACT IN AEPB
1.0000 | INHALATION_SPRAY | Freq: Every day | RESPIRATORY_TRACT | Status: DC
  Administered 2023-06-16 – 2023-06-18 (×3): 1 via RESPIRATORY_TRACT
  Filled 2023-06-15: qty 7

## 2023-06-15 MED ORDER — TIOTROPIUM BROMIDE MONOHYDRATE 18 MCG IN CAPS: 18.0000 ug | ORAL_CAPSULE | Freq: Every day | RESPIRATORY_TRACT | Status: DC

## 2023-06-15 MED ORDER — POTASSIUM CHLORIDE CRYS ER 20 MEQ PO TBCR
40.0000 meq | EXTENDED_RELEASE_TABLET | Freq: Once | ORAL | Status: AC
  Administered 2023-06-15: 40 meq via ORAL
  Filled 2023-06-15: qty 2

## 2023-06-15 MED ORDER — QUETIAPINE FUMARATE 25 MG PO TABS
25.0000 mg | ORAL_TABLET | Freq: Every evening | ORAL | Status: DC
  Administered 2023-06-15 – 2023-06-17 (×3): 25 mg via ORAL
  Filled 2023-06-15 (×3): qty 1

## 2023-06-15 MED ORDER — PANTOPRAZOLE SODIUM 40 MG PO TBEC
40.0000 mg | DELAYED_RELEASE_TABLET | Freq: Every day | ORAL | Status: DC
  Administered 2023-06-15 – 2023-06-18 (×4): 40 mg via ORAL
  Filled 2023-06-15 (×4): qty 1

## 2023-06-15 MED ORDER — LORAZEPAM 2 MG/ML IJ SOLN: 1.0000 mg | Freq: Once | INTRAMUSCULAR | Status: DC

## 2023-06-15 NOTE — Hospital Course (Signed)
HPI Per Dr. Nolberto [name] on 06/14/23  Michelle [name] is a 89 y.o. female with medical history significant of known seizure disorder as well as dementia.  Patient requires a wheelchair assistance to ambulate.  However is often able to transfer on her own.  Patient seems to have been having normal state of health when she woke up this morning and she went to the bathroom under the supervision of one of her sons who is the primary historian for this encounter, Michelle [name].  Once the patient was done with the bathroom and getting helped back into the wheelchair, patient had an episode of grimacing of her face and changing her gaze from left to right a few times which lasted for about 30 seconds to a minute.  There thereafter patient became nonresponsive in terms of verbal stimulation.  However patient remained awake, looking upfront and seated in the chair.  Patient did not pass out.  There is no report of patient having incontinence tongue injury or shaking of the extremities.  Over the course of the morning in the afternoon patient became progressively unresponsive and lethargic, prompting the patient to be brought to the ER.   There is no report of active seizure observation in the ER by the attending.  Per report from the ER attending, initial exam was concerning for left-sided neglect.  There seems to have been a concern that the patient may be having active seizure and patient received 2 mg of Ativan as well as Keppra in the ER.  Patient's ER course is notable for progressive lethargy and at this time patient is pretty much not responsive.  Medical evaluation is sought.  **Interim History Neurology has been consulted and she has undergone workup for her Lethargy with MRI and EEG. She is slowly improving. SLP recommending Dysphagia 3 Diet.  Assessment and Plan:  Acute Toxic Metabolic Encephalopathy and Lethargy -Following what seems to be an absence seizure by description by family member.   -CAT scan cranium  is not actionable.   -Report from the ER attending there was some concern of left-sided neglect on the initial evaluation in the ER.  -Therefore we will do an MRI to rule out stroke. patient has received 2 mg of Ativan as well as Keppra in the ER.  To break any ongoing seizures.   -Certainly the Ativan will take up till around 11 PM tonight to washout.   -Will keep the patient on neurochecks.   -MRI brain done and showed "Truncated and motion degraded brain MRI without acute finding including infarct." -EEG done and showed "This study is suggestive of mild diffuse encephalopathy. No seizures or epileptiform discharges were seen throughout the recording" -Urine drug screen is negative.   -Checked ABG to make sure were not dealing with occult hypoxemia or hypercapnia and showed ABG    Component Value Date/Time   PHART 7.42 06/14/2023 2152   PCO2ART 36 06/14/2023 2152   PO2ART 72 (L) 06/14/2023 2152   HCO3 23.5 06/14/2023 2152   TCO2 22 06/14/2023 1637   ACIDBASEDEF 0.8 06/14/2023 2152   O2SAT 96.5 06/14/2023 2152  -Maintain patient on telemetry.   -Neurology recommends Lamictal 50 twice daily and resuming Seroquel -It is noted that the patient recently started nortriptyline 10 mg daily, it had been stopped at the time of last discharge.  This is of uncertain clinical significance but continue to hold for now   Hyperkalemia -> Hypokalemia -Mild, incidental, EKG without any peaked T waves.   -Hold  patient's enalapril, treat with albuterol nebs and IV fluid infusion tonight.  -Patient's K+ Level Trend: Recent Labs  Lab 05/22/23 0637 05/23/23 0451 05/24/23 0445 06/12/23 1433 06/14/23 1630 06/14/23 1637 06/15/23 0625  K 3.8 4.1 3.8 3.8 5.5* 5.8* 3.4*  -Replete with po Kcl 40 mEQ x1 -Continue to Monitor and Replete as Necessary -Repeat CMP in the AM   COPD -Resume Inhalers SpO2: 97 %  HLD -No longer taking Fenofibrate 134 mg Capsule  CAD -Resume Home Meds with ASA, Plavix, BB  and ACE-I  Normocytic Anemia -Hgb/Hct Trend: Recent Labs  Lab 05/22/23 0637 05/23/23 0451 05/24/23 0445 06/12/23 1433 06/14/23 1630 06/14/23 1637 06/15/23 0625  HGB 12.4 11.9* 12.0 11.2* 12.9 13.9 11.7*  HCT 39.2 37.5 37.7 34.7* 41.7 41.0 35.8*  MCV 83.6 81.9 82.0 86.4 85.6  --  83.3  -Check Anemia Panel in the AM -Continue to Monitor for S/Sx of Bleeding; No overt bleeding noted -Repeat CBC in the AM  Overweight -Complicates overall prognosis and care -Estimated body mass index is 27.88 kg/m as calculated from the following:   Height as of this encounter: 5\' 5"  (1.651 m).   Weight as of this encounter: 76 kg.  -Weight Loss and Dietary Counseling given

## 2023-06-15 NOTE — Progress Notes (Signed)
PROGRESS NOTE    Michelle [name]  UYQ:[id number] DOB: 24-Mar-1933 DOA: 06/14/2023 PCP: Shelva Majestic, MD   Brief Narrative:  HPI Per Dr. Nolberto Hanlon on 06/14/23  Michelle [name] is a 89 y.o. female with medical history significant of known seizure disorder as well as dementia.  Patient requires a wheelchair assistance to ambulate.  However is often able to transfer on her own.  Patient seems to have been having normal state of health when she woke up this morning and she went to the bathroom under the supervision of one of her sons who is the primary historian for this encounter, [name].  Once the patient was done with the bathroom and getting helped back into the wheelchair, patient had an episode of grimacing of her face and changing her gaze from left to right a few times which lasted for about 30 seconds to a minute.  There thereafter patient became nonresponsive in terms of verbal stimulation.  However patient remained awake, looking upfront and seated in the chair.  Patient did not pass out.  There is no report of patient having incontinence tongue injury or shaking of the extremities.  Over the course of the morning in the afternoon patient became progressively unresponsive and lethargic, prompting the patient to be brought to the ER.   There is no report of active seizure observation in the ER by the attending.  Per report from the ER attending, initial exam was concerning for left-sided neglect.  There seems to have been a concern that the patient may be having active seizure and patient received 2 mg of Ativan as well as Keppra in the ER.  Patient's ER course is notable for progressive lethargy and at this time patient is pretty much not responsive.  Medical evaluation is sought.  **Interim History Neurology has been consulted and she has undergone workup for her Lethargy with MRI and EEG. She is slowly improving. SLP recommending Dysphagia 3 Diet.  Assessment and Plan:  Acute Toxic  Metabolic Encephalopathy and Lethargy -Following what seems to be an absence seizure by description by family member.   -CAT scan cranium is not actionable.   -Report from the ER attending there was some concern of left-sided neglect on the initial evaluation in the ER.  -Therefore we will do an MRI to rule out stroke. patient has received 2 mg of Ativan as well as Keppra in the ER.  To break any ongoing seizures.   -Certainly the Ativan will take up till around 11 PM tonight to washout.   -Will keep the patient on neurochecks.   -MRI brain done and showed "Truncated and motion degraded brain MRI without acute finding including infarct." -EEG done and showed "This study is suggestive of mild diffuse encephalopathy. No seizures or epileptiform discharges were seen throughout the recording" -Urine drug screen is negative.   -Checked ABG to make sure were not dealing with occult hypoxemia or hypercapnia and showed ABG    Component Value Date/Time   PHART 7.42 06/14/2023 2152   PCO2ART 36 06/14/2023 2152   PO2ART 72 (L) 06/14/2023 2152   HCO3 23.5 06/14/2023 2152   TCO2 22 06/14/2023 1637   ACIDBASEDEF 0.8 06/14/2023 2152   O2SAT 96.5 06/14/2023 2152  -Maintain patient on telemetry.   -Neurology recommends Lamictal 50 twice daily and resuming Seroquel -It is noted that the patient recently started nortriptyline 10 mg daily, it had been stopped at the time of last discharge.  This is of uncertain  clinical significance but continue to hold for now   Hyperkalemia -> Hypokalemia -Mild, incidental, EKG without any peaked T waves.   -Hold patient's enalapril, treat with albuterol nebs and IV fluid infusion tonight.  -Patient's K+ Level Trend: Recent Labs  Lab 05/22/23 0637 05/23/23 0451 05/24/23 0445 06/12/23 1433 06/14/23 1630 06/14/23 1637 06/15/23 0625  K 3.8 4.1 3.8 3.8 5.5* 5.8* 3.4*  -Replete with po Kcl 40 mEQ x1 -Continue to Monitor and Replete as Necessary -Repeat CMP in the AM    COPD -Resume Inhalers SpO2: 97 %  HLD -No longer taking Fenofibrate 134 mg Capsule  CAD -Resume Home Meds with ASA, Plavix, BB and ACE-I  Normocytic Anemia -Hgb/Hct Trend: Recent Labs  Lab 05/22/23 0637 05/23/23 0451 05/24/23 0445 06/12/23 1433 06/14/23 1630 06/14/23 1637 06/15/23 0625  HGB 12.4 11.9* 12.0 11.2* 12.9 13.9 11.7*  HCT 39.2 37.5 37.7 34.7* 41.7 41.0 35.8*  MCV 83.6 81.9 82.0 86.4 85.6  --  83.3  -Check Anemia Panel in the AM -Continue to Monitor for S/Sx of Bleeding; No overt bleeding noted -Repeat CBC in the AM  Overweight -Complicates overall prognosis and care -Estimated body mass index is 27.88 kg/m as calculated from the following:   Height as of this encounter: 5\' 5"  (1.651 m).   Weight as of this encounter: 76 kg.  -Weight Loss and Dietary Counseling given   DVT prophylaxis: enoxaparin (LOVENOX) injection 40 mg Start: 06/15/23 1000 SCDs Start: 06/14/23 1857    Code Status: Limited: Do not attempt resuscitation (DNR) -DNR-LIMITED -Do Not Intubate/DNI  Family Communication: Discussed with Son at bedside  Disposition Plan:  Level of care: Progressive Status is: Inpatient Remains inpatient appropriate because: Needs further clinical improvement and clearance by Neurology as well as PT/OT recommendations   Consultants:  Neuro  Procedures:  MRI  EEG  Antimicrobials:  Anti-infectives (From admission, onward)    None       Subjective: Seen and examined at bedside she is very lethargic still but more interactive.  Oriented x 2 only.  No nausea or vomiting.  Feels okay.  No other concerns or complaints at this time but wants to close her eyes and rest  Objective: Vitals:   06/15/23 0643 06/15/23 0727 06/15/23 1148 06/15/23 1626  BP: (!) 173/62 (!) 161/61 (!) 146/54 (!) 145/45  Pulse: 68 74 73 74  Resp:  17 18 18   Temp:  98.7 F (37.1 C) 98.4 F (36.9 C) 98.1 F (36.7 C)  TempSrc:  Oral Oral Oral  SpO2:  95% 95% 97%  Weight:       Height:        Intake/Output Summary (Last 24 hours) at 06/15/2023 1912 Last data filed at 06/15/2023 1631 Gross per 24 hour  Intake 1287.41 ml  Output --  Net 1287.41 ml   Filed Weights   06/14/23 1546  Weight: 76 kg   Examination: Physical Exam:  Constitutional: WN/WD overweight chronically ill-appearing Caucasian female who is somnolent and drowsy and very lethargic Respiratory: Diminished to auscultation bilaterally, no wheezing, rales, rhonchi or crackles. Normal respiratory effort and patient is not tachypenic. No accessory muscle use.  Unlabored breathing Cardiovascular: RRR, no murmurs / rubs / gallops. S1 and S2 auscultated. No extremity edema. Abdomen: Soft, non-tender, distended secondary to body habitus. Bowel sounds positive.  GU: Deferred. Musculoskeletal: No clubbing / cyanosis of digits/nails. No joint deformity upper and lower extremities.  Skin: No rashes, lesions, ulcers on a limited skin evaluation. No induration; Warm and  dry.  Neurologic: Somnolent and drowsy  Psychiatric: Impaired judgement and insight and only oriented x2  Data Reviewed: I have personally reviewed following labs and imaging studies  CBC: Recent Labs  Lab 06/12/23 1433 06/14/23 1630 06/14/23 1637 06-22-23 0625  WBC 8.4 7.7  --  6.7  NEUTROABS 6.0 5.8  --   --   HGB 11.2* 12.9 13.9 11.7*  HCT 34.7* 41.7 41.0 35.8*  MCV 86.4 85.6  --  83.3  PLT 221.0 191  --  222   Basic Metabolic Panel: Recent Labs  Lab 06/12/23 1433 06/14/23 1630 06/14/23 1637 06/14/23 2129 06/22/23 0625  NA 136 136 136  --  138  K 3.8 5.5* 5.8*  --  3.4*  CL 102 103 108  --  104  CO2 26 22  --   --  26  GLUCOSE 118* 125* 126*  --  92  BUN 27* 25* 38*  --  17  CREATININE 0.99 0.77 0.80  --  0.99  CALCIUM 9.4 9.8  --   --  9.5  PHOS  --   --   --  3.4  --    GFR: Estimated Creatinine Clearance: 40.1 mL/min (by C-G formula based on SCr of 0.99 mg/dL). Liver Function Tests: Recent Labs  Lab  06/12/23 1433 06/14/23 1630  AST 14 28  ALT 11 16  ALKPHOS 44 44  BILITOT 0.3 0.7  PROT 6.7 7.3  ALBUMIN 3.7 3.6   No results for input(s): "LIPASE", "AMYLASE" in the last 168 hours. No results for input(s): "AMMONIA" in the last 168 hours. Coagulation Profile: Recent Labs  Lab 06/14/23 1630 22-Jun-2023 0625  INR 1.1 1.2   Cardiac Enzymes: Recent Labs  Lab 06/14/23 2129  CKTOTAL 16*   BNP (last 3 results) No results for input(s): "PROBNP" in the last 8760 hours. HbA1C: No results for input(s): "HGBA1C" in the last 72 hours. CBG: Recent Labs  Lab 06/14/23 1616 06/14/23 2057 2023/06/22 1148 June 22, 2023 1626  GLUCAP 137* 87 104* 174*   Lipid Profile: No results for input(s): "CHOL", "HDL", "LDLCALC", "TRIG", "CHOLHDL", "LDLDIRECT" in the last 72 hours. Thyroid Function Tests: No results for input(s): "TSH", "T4TOTAL", "FREET4", "T3FREE", "THYROIDAB" in the last 72 hours. Anemia Panel: No results for input(s): "VITAMINB12", "FOLATE", "FERRITIN", "TIBC", "IRON", "RETICCTPCT" in the last 72 hours. Sepsis Labs: Recent Labs  Lab 06/14/23 1638  LATICACIDVEN 1.7   No results found for this or any previous visit (from the past 240 hour(s)).   Radiology Studies: EEG adult  Result Date: 2023-06-22 Charlsie Quest, MD     Jun 22, 2023 10:37 AM Patient Name: BERENIZ TOCCI MRN: [id number] Epilepsy Attending: Charlsie Quest Referring Physician/Provider: Marjorie Smolder, NP Date: 22-Jun-2023 Duration: 23.55 mins Patient history: 89yo F with history of recent admission for breakthrough seizures with prior history of seizures, diabetes, COPD, modified Rankin score of 4 at baseline, presenting with concern for depressed mental status after a staring spell this morning concerning for seizure. EEG to evaluate for seizure Level of alertness: Awake AEDs during EEG study: LTG., Ativan Technical aspects: This EEG study was done with scalp electrodes positioned according to the 10-20  International system of electrode placement. Electrical activity was reviewed with band pass filter of 1-70Hz , sensitivity of 7 uV/mm, display speed of 71mm/sec with a 60Hz  notched filter applied as appropriate. EEG data were recorded continuously and digitally stored.  Video monitoring was available and reviewed as appropriate. Description: The posterior dominant rhythm consists  of 8 Hz activity of moderate voltage (25-35 uV) seen predominantly in posterior head regions, symmetric and reactive to eye opening and eye closing. EEG showed intermittent generalized 3 to 6 Hz theta-delta slowing.  Hyperventilation and photic stimulation were not performed.  Of note, study was technically difficult due to significant electrode and movement artifact. ABNORMALITY - Intermittent slow, generalized IMPRESSION: This study is suggestive of mild diffuse encephalopathy. No seizures or epileptiform discharges were seen throughout the recording. Charlsie Quest   MR BRAIN WO CONTRAST  Result Date: 06/15/2023 CLINICAL DATA:  Stroke follow-up.  Altered mental status. EXAM: MRI HEAD WITHOUT CONTRAST TECHNIQUE: Multiplanar, multiecho pulse sequences of the brain and surrounding structures were obtained without intravenous contrast. COMPARISON:  CTA of the head neck from yesterday FINDINGS: Truncated and motion degraded study, multiple attempts were made for diagnostic images. Axial diffusion imaging is degraded but diagnostic and negative for infarct. Major flow voids are preserved on T2 weighted imaging. No evidence of hemorrhage, hydrocephalus, or mass. IMPRESSION: Truncated and motion degraded brain MRI without acute finding including infarct. Electronically Signed   By: Tiburcio Pea M.D.   On: 06/15/2023 09:07   DG CHEST PORT 1 VIEW  Result Date: 06/14/2023 CLINICAL DATA:  Cough and altered mental status. EXAM: PORTABLE CHEST 1 VIEW COMPARISON:  May 17, 2023 FINDINGS: The heart size and mediastinal contours are  within normal limits. There is marked severity calcification of the thoracic aorta. Mild, diffuse, chronic appearing increased lung markings are seen with mild atelectatic changes suspected within the left lung base. No pleural effusion or pneumothorax is identified. Multilevel degenerative changes seen throughout the thoracic spine. IMPRESSION: Chronic appearing increased lung markings with mild left basilar atelectasis. Electronically Signed   By: Aram Candela M.D.   On: 06/14/2023 18:36   CT ANGIO HEAD NECK W WO CM W PERF (CODE STROKE)  Result Date: 06/14/2023 CLINICAL DATA:  Altered mental status. Seizure at 8 o'clock a.m. Dementia. EXAM: CT ANGIOGRAPHY HEAD AND NECK CT PERFUSION BRAIN TECHNIQUE: Multidetector CT imaging of the head and neck was performed using the standard protocol during bolus administration of intravenous contrast. Multiplanar CT image reconstructions and MIPs were obtained to evaluate the vascular anatomy. Carotid stenosis measurements (when applicable) are obtained utilizing NASCET criteria, using the distal internal carotid diameter as the denominator. Multiphase CT imaging of the brain was performed following IV bolus contrast injection. Subsequent parametric perfusion maps were calculated using RAPID software. RADIATION DOSE REDUCTION: This exam was performed according to the departmental dose-optimization program which includes automated exposure control, adjustment of the mA and/or kV according to patient size and/or use of iterative reconstruction technique. CONTRAST:  OMNIPAQUE IOHEXOL 350 MG/ML SOLN COMPARISON:  CT head without contrast 05/17/2023 MR head without contrast 06/25/2020. FINDINGS: CTA NECK FINDINGS Aortic arch: Study is moderately degraded by patient motion. Atherosclerotic changes are present at the aortic arch and great vessel origins. A 50% stenosis at the origin of the left subclavian is suspected. No other definite stenosis are present. Right  carotid system: The right common carotid artery is within normal limits. Bifurcation demonstrates some atherosclerotic change without focal stenosis. Mild tortuosity is present cervical right ICA without significant stenosis of greater than 50%. Left carotid system: The left common carotid artery is within normal limits. More pronounced atherosclerotic calcifications are present bifurcation proximal left ICA without significant stenosis. Mild tortuosity is present in the cervical left ICA without significant stenosis. Vertebral arteries: The vertebral arteries are not well seen in the  V1 or V2 segments due to patient motion. The more distal cervical vertebral arteries are within normal limits bilaterally. Skeleton: Multilevel degenerative changes are present cervical spine. Ankylosis with grade 1 anterolisthesis is present at C6-7. No focal osseous lesions are present. Other neck: Soft tissues the neck are otherwise unremarkable. Salivary glands are within normal limits. Thyroid is normal. No significant adenopathy is present. No focal mucosal or submucosal lesions are present. Upper chest: The lung apices are clear. The thoracic inlet is within normal limits. Review of the MIP images confirms the above findings CTA HEAD FINDINGS Anterior circulation: Atherosclerotic calcifications are present within the cavernous internal carotid arteries bilaterally without significant stenosis through the ICA termini. The right A1 segment is aplastic. Both A2 segments fill from the left. M1 segments are normal bilaterally. The MCA bifurcations are normal. The ACA and MCA branch vessels are within normal limits. No aneurysm is present. Posterior circulation: PICA origins are visualized and normal. The vertebrobasilar junction basilar artery normal. The superior cerebellar arteries are patent. Both posterior cerebral arteries originate from basilar tip. The PCA branch vessels are normal bilaterally. No aneurysm is present. Venous  sinuses: The dural sinuses are patent. The straight sinus and deep cerebral veins are intact. Cortical veins are within normal limits. No significant vascular malformation is evident. Anatomic variants: None Review of the MIP images confirms the above findings CT Brain Perfusion Findings: ASPECTS: 10/10 CBF (<30%) Volume: 0mL Perfusion (Tmax>6.0s) volume: 0mL IMPRESSION: 1. No emergent large vessel occlusion. 2. Atherosclerotic changes at the carotid bifurcations and cavernous internal carotid arteries bilaterally without significant stenosis. 3. The vertebral arteries are not well seen in the V1 or V2 segments due to patient motion. The more distal cervical vertebral arteries are within normal limits bilaterally. 4. A 50% stenosis at the origin of the left subclavian is suspected. 5. Multilevel degenerative changes of the cervical spine. 6. Ankylosis with grade 1 anterolisthesis at C6-7. 7. Normal CT perfusion. 8.  Aortic Atherosclerosis (ICD10-I70.0). Electronically Signed   By: Marin Roberts M.D.   On: 06/14/2023 17:27   CT HEAD CODE STROKE WO CONTRAST  Result Date: 06/14/2023 CLINICAL DATA:  Code stroke. Right-sided gaze. Left-sided weakness. EXAM: CT HEAD WITHOUT CONTRAST TECHNIQUE: Contiguous axial images were obtained from the base of the skull through the vertex without intravenous contrast. RADIATION DOSE REDUCTION: This exam was performed according to the departmental dose-optimization program which includes automated exposure control, adjustment of the mA and/or kV according to patient size and/or use of iterative reconstruction technique. COMPARISON:  CT head without contrast 05/17/2023 FINDINGS: Brain: Mild generalized atrophy and white matter disease is stable. No acute infarct, hemorrhage, or mass lesion is present. The ventricles are proportionate to the degree of atrophy. Deep brain nuclei are within normal limits. Insular cortex is within normal limits bilaterally. The brainstem and  cerebellum are within normal limits. Midline structures are within normal limits. Vascular: Atherosclerotic calcifications are present within the cavernous internal carotid arteries and at the dural margin left vertebral artery. No hyperdense vessel is present. Skull: Calvarium is intact. No focal lytic or blastic lesions are present. No significant extracranial soft tissue lesion is present. Sinuses/Orbits: The paranasal sinuses and mastoid air cells are clear. Bilateral lens replacements are noted. Globes and orbits are otherwise unremarkable. ASPECTS [facility] Stroke Program Early CT Score) - Ganglionic level infarction (caudate, lentiform nuclei, internal capsule, insula, M1-M3 cortex): 7/7 - Supraganglionic infarction (M4-M6 cortex): 3/3 Total score (0-10 with 10 being normal): 10/10 IMPRESSION: 1. No acute  intracranial abnormality or significant interval change. 2. Stable atrophy and white matter disease. This likely reflects the sequela of chronic microvascular ischemia. 3. Aspects is 10/10. The above was relayed via text pager to Dr. Marthe Patch on 06/14/2023 at 16:49 . Electronically Signed   By: Marin Roberts M.D.   On: 06/14/2023 16:49    Scheduled Meds:  aspirin  81 mg Oral Daily   aspirin  81 mg Oral Daily   carvedilol  12.5 mg Oral BID WC   carvedilol  12.5 mg Oral BID   clopidogrel  75 mg Oral Daily   clopidogrel  75 mg Oral Daily   enalapril  20 mg Oral QHS   enoxaparin (LOVENOX) injection  40 mg Subcutaneous Q24H   insulin aspart  0-5 Units Subcutaneous QHS   insulin aspart  0-9 Units Subcutaneous TID WC   lamoTRIgine  50 mg Oral BID   LORazepam  1 mg Intravenous Once   mometasone-formoterol  2 puff Inhalation BID   pantoprazole  20 mg Oral Daily   pantoprazole  40 mg Oral Daily   potassium chloride  40 mEq Oral Once   QUEtiapine  25 mg Oral QPM   rosuvastatin  40 mg Oral Daily   [START ON 06/16/2023] tiotropium  18 mcg Inhalation Daily   Continuous Infusions:  sodium  bicarbonate 150 mEq in dextrose 5 % 1,150 mL infusion 75 mL/hr at 06/15/23 1657    LOS: 1 day   Marguerita Merles, DO [facility] Available via Epic secure chat 7am-7pm After these hours, please refer to coverage provider listed on [email or web address] 06/15/2023, 7:12 PM

## 2023-06-15 NOTE — Plan of Care (Signed)
  Problem: Metabolic: Goal: Ability to maintain appropriate glucose levels will improve Outcome: Progressing   Problem: Skin Integrity: Goal: Risk for impaired skin integrity will decrease Outcome: Progressing   Problem: Clinical Measurements: Goal: Ability to maintain clinical measurements within normal limits will improve Outcome: Progressing Goal: Will remain free from infection Outcome: Progressing Goal: Diagnostic test results will improve Outcome: Progressing Goal: Respiratory complications will improve Outcome: Progressing Goal: Cardiovascular complication will be avoided Outcome: Progressing   Problem: Elimination: Goal: Will not experience complications related to bowel motility Outcome: Progressing Goal: Will not experience complications related to urinary retention Outcome: Progressing

## 2023-06-15 NOTE — Progress Notes (Signed)
EEG complete - results pending 

## 2023-06-15 NOTE — Progress Notes (Signed)
NEUROLOGY CONSULT FOLLOW UP NOTE   Date of service: June 15, 2023 Patient Name: Michelle [name] MRN:  [id number] DOB:  [birth year]  Brief HPI  [name] is a 89 y.o. 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 who presented with altered mental status after an absence seizure yesterday morning.  Patient had her typical absence seizure with staring off into space and being unresponsive at about 8:00 yesterday morning but declined to go to the hospital at that time.  Later, she took a nap and was difficult to arouse, so family called EMS to bring her to the hospital.  She was noted to have right gaze deviation and would not blink to threat on the left on exam, but CTAP was negative for LVO or core or penumbra infarct.  Patient is awaiting MRI and EEG, but confusion and agitation have been a barrier to getting this done.     Interval Hx/subjective   Patient has been hemodynamically stable overnight, and hyperkalemia has resolved.  She continues to be confused, disoriented to place time and situation but appears calm this morning.  Vitals   Vitals:   06/15/23 0445 06/15/23 0535 06/15/23 0643 06/15/23 0727  BP:  (!) 161/65 (!) 173/62 (!) 161/61  Pulse: 76 70 68 74  Resp:    17  Temp:    98.7 F (37.1 C)  TempSrc:    Oral  SpO2: 97%   95%  Weight:      Height:         Body mass index is 27.88 kg/m.  Physical Exam   Constitutional: Elderly, well-developed, well-nourished patient in no acute distress Psych: Affect appropriate to  situation.  Eyes: No scleral injection.  HENT: No OP obstrucion.  Head: Normocephalic.  Respiratory: Effort normal, non-labored breathing.  Skin: WDI.   Neurologic Examination    NEURO:  Mental Status: Patient is drowsy and oriented to self but disoriented to place time and situation, intermittently follows simple commands but cooperation with exam is limited Speech/Language: speech is without dysarthria or aphasia.  Does not name objects but appears to understand what is being said  Cranial Nerves:  II: PERRL.  Resists eye opening III, IV, VI: Difficult to test but patient does track examiner to the right VII: Face is symmetrical at rest VIII: hearing intact to voice. IX, X: Phonation is normal.  XII: Noncooperative with tongue protrusion Motor: Moves all 4 extremities with symmetrical, antigravity strength Tone: is normal and bulk is normal Sensation- Intact to light touch bilaterally.  Coordination: Unable to perform Gait- deferred  Medications  Current Facility-Administered Medications:    acetaminophen (TYLENOL) tablet 650 mg, 650 mg, Oral, Q6H PRN **OR** acetaminophen (TYLENOL) suppository 650 mg, 650 mg, Rectal, Q6H PRN, Nolberto Hanlon, MD   albuterol (PROVENTIL) (2.5 MG/3ML) 0.083% nebulizer solution 2.5 mg, 2.5 mg, Nebulization, Q4H, Nolberto Hanlon, MD, 2.5 mg at 06/15/23 0413   aspirin chewable tablet 81 mg, 81 mg, Oral, Daily, Nolberto Hanlon, MD, 81 mg at 06/15/23 0732   carvedilol (COREG) tablet 12.5 mg, 12.5 mg, Oral, BID WC, Nolberto Hanlon, MD, 12.5 mg at 06/15/23 0730  clopidogrel (PLAVIX) tablet 75 mg, 75 mg, Oral, Daily, Nolberto Hanlon, MD, 75 mg at 06/15/23 0731   enoxaparin (LOVENOX) injection 40 mg, 40 mg, Subcutaneous, Q24H, Nolberto Hanlon, MD, 40 mg at 06/15/23 0732   hydrALAZINE (APRESOLINE) injection 10 mg, 10 mg, Intravenous, Q4H PRN, Nolberto Hanlon, MD, 10 mg at 06/15/23 0647   insulin aspart (novoLOG) injection 0-5 Units, 0-5 Units, Subcutaneous, QHS, Nolberto Hanlon, MD    insulin aspart (novoLOG) injection 0-9 Units, 0-9 Units, Subcutaneous, TID WC, Nolberto Hanlon, MD   labetalol (NORMODYNE) injection 20 mg, 20 mg, Intravenous, Q2H PRN, Nolberto Hanlon, MD, 20 mg at 06/15/23 0457   lamoTRIgine (LAMICTAL) tablet 50 mg, 50 mg, Oral, BID, Nolberto Hanlon, MD, 50 mg at 06/15/23 0731   pantoprazole (PROTONIX) EC tablet 20 mg, 20 mg, Oral, Daily, Nolberto Hanlon, MD, 20 mg at 06/15/23 0731   polyethylene glycol (MIRALAX / GLYCOLAX) packet 17 g, 17 g, Oral, Daily PRN, Nolberto Hanlon, MD   sodium bicarbonate 150 mEq in dextrose 5 % 1,150 mL infusion, , Intravenous, Continuous, Nolberto Hanlon, MD, Last Rate: 75 mL/hr at 06/15/23 0618, Infusion Verify at 06/15/23 0618 Labs and Diagnostic Imaging   CBC:  Recent Labs  Lab 06/12/23 1433 06/14/23 1630 06/14/23 1637 06/15/23 0625  WBC 8.4 7.7  --  6.7  NEUTROABS 6.0 5.8  --   --   HGB 11.2* 12.9 13.9 11.7*  HCT 34.7* 41.7 41.0 35.8*  MCV 86.4 85.6  --  83.3  PLT 221.0 191  --  222    Basic Metabolic Panel:  Lab Results  Component Value Date   NA 138 06/15/2023   K 3.4 (L) 06/15/2023   CO2 26 06/15/2023   GLUCOSE 92 06/15/2023   BUN 17 06/15/2023   CREATININE 0.99 06/15/2023   CALCIUM 9.5 06/15/2023   GFRNONAA 55 (L) 06/15/2023   GFRAA 77 07/01/2020   Lipid Panel:  Lab Results  Component Value Date   LDLCALC 32 08/19/2022   HgbA1c:  Lab Results  Component Value Date   HGBA1C 6.4 06/12/2023   Urine Drug Screen:     Component Value Date/Time   LABOPIA NONE DETECTED 06/14/2023 1545   COCAINSCRNUR NONE DETECTED 06/14/2023 1545   LABBENZ NONE DETECTED 06/14/2023 1545   AMPHETMU NONE DETECTED 06/14/2023 1545   THCU NONE DETECTED 06/14/2023 1545   LABBARB NONE DETECTED 06/14/2023 1545    Alcohol Level     Component Value Date/Time   ETH <10 06/14/2023 1630   INR  Lab Results  Component Value Date   INR 1.2 06/15/2023   APTT  Lab Results  Component Value Date   APTT 29 06/15/2023   AED levels:  Lab Results   Component Value Date   LAMOTRIGINE <1.0 (L) 05/17/2023    CT Head without contrast(Personally reviewed): No acute abnormality  CT angio Head and Neck with contrast(Personally reviewed): No LVO, 50% stenosis at origin of left subclavian, no core penumbra infarct seen on CTP  MRI Brain(Personally reviewed): Motion degraded, truncated exam but negative for acute abnormality  rEEG:  Pending  Assessment   MAKENSI FAILING is a 89 y.o. female with history of CAD, COPD, diabetes, hypertension, hyperlipidemia, MI, dementia and seizures who was admitted yesterday with altered mental status after having a seizure, initially declining transport to the hospital been taking a nap and being difficult to arouse.  Per family, she is normally alert and oriented x 3.  She was noted to be restless and agitated in the ED,  making obtaining scans challenging.  She had a possible right gaze deviation and did not blink to threat on the left on arrival.  CT head demonstrated no acute abnormalities, and no core penumbra infarct was seen on CT perfusion.  Awaiting MRI and resting EEG to evaluate for stroke or seizure activity, primary team performing workup for causes of toxic metabolic encephalopathy.  Urinalysis was negative, and chest x-ray did not demonstrate evidence of pneumonia.  She also has been afebrile without leukocytosis, so given lack of apparent infection, altered mental status is likely caused by prolonged postictal state in patient with dementia.  MRI was negative for acute abnormality, although exam was motion degraded and truncated.  Recommendations   -Routine EEG -Lamictal 50 mg twice daily, slightly increased from home dose of 20 5 in the morning and 50 at night -Continue home Seroquel 25 mg every 6 PM -Neurology will sign off, please contact with questions or concerns. ______________________________________________________________________  Patient seen by NP and then by MD, MD to edit note as  needed.  Signed, Cortney E Ernestina Columbia, NP [facility]   NEUROHOSPITALIST ADDENDUM Performed a face to face diagnostic evaluation.   I have reviewed the contents of history and physical exam as documented by PA/ARNP/Resident and agree with above documentation.  I have discussed and formulated the above plan as documented. Edits to the note have been made as needed.   Erick Blinks, MD [facility] [phone]   If 7pm to 7am, please call on call as listed on AMION.

## 2023-06-15 NOTE — Evaluation (Signed)
Clinical/Bedside Swallow Evaluation Patient Details  Name: Michelle [name] MRN: [id number] Date of Birth: 12-28-1932  Today's Date: 06/15/2023 Time: SLP Start Time (ACUTE ONLY): 1444 SLP Stop Time (ACUTE ONLY): 1457 SLP Time Calculation (min) (ACUTE ONLY): 13 min  Past Medical History:  Past Medical History:  Diagnosis Date   Anginal pain (HCC)    Arthritis    AV block, 1st degree    CAD (coronary artery disease) 1990; 2015   Cardiac cath 1990 with Dr. Riley Kill and [name] Residual CAD, non obstructive per 2015 cath. STEMI status post stent in August 2015.   COPD (chronic obstructive pulmonary disease) (HCC)    Diabetes mellitus type 2, insulin dependent (HCC)    Essential hypertension    Hyperlipidemia with target LDL less than 70    Myocardial infarction [facility])    Pericarditis-post MI (short course of steroids) 03/06/2014   S/P coronary artery stent placement 02/18/14, DES -RCA to cover RCA aneurysm 02/18/14   Promus DES to RCA with STEMI   Shortness of breath    Past Surgical History:  Past Surgical History:  Procedure Laterality Date   CATARACT EXTRACTION  2020   right and left eye    CHOLECYSTECTOMY     thinks her appendix was removed at the same time   CORONARY ANGIOPLASTY WITH STENT PLACEMENT  02/18/14   Promus DES to RCA   LEFT HEART CATHETERIZATION WITH CORONARY ANGIOGRAM N/A 02/18/2014   Procedure: LEFT HEART CATHETERIZATION WITH CORONARY ANGIOGRAM;  Surgeon: Corky Crafts, MD;  Location: [facility] CATH LAB;  Service: Cardiovascular;  Laterality: N/A;   PTCA  1990   PTCA of RCA   TRANSTHORACIC ECHOCARDIOGRAM  02/02/2012   mild LVH, EF 55-60%, Normal WM, Gr 1 DD; Mild MR   HPI:  Michelle [name] is an 89 yo female presenting to ED 11/27 after seizure activity with AMS. MRI Brain without evidence of acute infarct. EEG suggestive  of mild diffuse encephalopathy without evidence of epileptiform discharges. Most recently seen by SLP 02/2023 with recommendations for a Dys 2 consistency diet. PMH includes seizure disorder, dementia, HTN, anginal pain, AV block, CAD, COPD, T2DM, HLD, MI s/p coronary artery stent (2015)    Assessment / Plan / Recommendation  Clinical Impression  Pt's son reports pt typically eats diet most similar to mechanical soft at home without any difficulties. Oral motor exam WFL, although note edentulism. Observed pt with trials of thin liquids, purees, and regular texture solids without any signs of dysphagia. She presents with a delayed cough intermittently following trials, which pt and her son report is baseline and happens in the absence of POs (per chart review, referred to as her baseline COPD cough). Recommend diet of Dys 3 texture solids and thin liquids. SLP will f/u given the potential for fluctuating mentation. SLP Visit Diagnosis: Dysphagia, unspecified (R13.10)    Aspiration Risk  Mild aspiration risk    Diet Recommendation Dysphagia 3 (Mech soft);Thin liquid    Liquid Administration via: Cup;Straw Medication Administration: Whole meds with puree Supervision: Patient able to self feed;Intermittent supervision to cue for compensatory strategies Compensations: Minimize environmental distractions;Slow rate;Small sips/bites Postural Changes: Seated upright at 90 degrees    Other  Recommendations Oral Care Recommendations: Oral care BID;Staff/trained caregiver to provide oral care    Recommendations for follow up therapy are one component of  a multi-disciplinary discharge planning process, led by the attending physician.  Recommendations may be updated based on patient status, additional functional criteria and insurance authorization.  Follow up Recommendations Other (comment) (pt is expected to progress without f/u anticipated upon d/c)      Assistance Recommended at Discharge     Functional Status Assessment Patient has had a recent decline in their functional status and demonstrates the ability to make significant improvements in function in a reasonable and predictable amount of time.  Frequency and Duration min 2x/week  1 week       Prognosis Prognosis for improved oropharyngeal function: Good Barriers to Reach Goals: Time post onset;Cognitive deficits      Swallow Study   General HPI: Michelle [name] is an 89 yo female presenting to ED 11/27 after seizure activity with AMS. MRI Brain without evidence of acute infarct. EEG suggestive of mild diffuse encephalopathy without evidence of epileptiform discharges. Most recently seen by SLP 02/2023 with recommendations for a Dys 2 consistency diet. PMH includes seizure disorder, dementia, HTN, anginal pain, AV block, CAD, COPD, T2DM, HLD, MI s/p coronary artery stent (2015) Type of Study: Bedside Swallow Evaluation Previous Swallow Assessment: see HPI Diet Prior to this Study: Regular;Thin liquids (Level 0);Other (Comment) ("soft" GI diet) Temperature Spikes Noted: No Respiratory Status: Room air History of Recent Intubation: No Behavior/Cognition: Alert;Cooperative;Pleasant mood Oral Cavity Assessment: Within Functional Limits Oral Care Completed by SLP: No Oral Cavity - Dentition: Edentulous Vision: Functional for self-feeding Self-Feeding Abilities: Able to feed self Patient Positioning: Upright in bed Baseline Vocal Quality: Normal Volitional Cough: Congested Volitional Swallow: Able to elicit    Oral/Motor/Sensory Function Overall Oral Motor/Sensory Function: Within functional limits   Ice Chips Ice chips: Not tested   Thin Liquid Thin Liquid: Impaired Presentation: Straw Pharyngeal  Phase Impairments: Cough - Delayed    Nectar Thick Nectar Thick Liquid: Not tested   Honey Thick Honey Thick Liquid: Not tested   Puree Puree: Within functional limits Presentation: Spoon;Self Fed   Solid     Solid:  Within functional limits Presentation: Self Fed      Gwynneth Aliment, M.A., CF-SLP Speech Language Pathology, Acute Rehabilitation Services  Secure Chat preferred [phone]  06/15/2023,3:03 PM

## 2023-06-15 NOTE — Procedures (Signed)
Patient Name: Michelle [name]  MRN: [id number]  Epilepsy Attending: Charlsie Quest  Referring Physician/Provider: Marjorie Smolder, NP  Date: 06/15/2023  Duration: 23.55 mins  Patient history: 89yo F with history of recent admission for breakthrough seizures with prior history of seizures, diabetes, COPD, modified Rankin score of 4 at baseline, presenting with concern for depressed mental status after a staring spell this morning concerning for seizure. EEG to evaluate for seizure  Level of alertness: Awake  AEDs during EEG study: LTG., Ativan  Technical aspects: This EEG study was done with scalp electrodes positioned according to the 10-20 International system of electrode placement. Electrical activity was reviewed with band pass filter of 1-70Hz , sensitivity of 7 uV/mm, display speed of 97mm/sec with a 60Hz  notched filter applied as appropriate. EEG data were recorded continuously and digitally stored.  Video monitoring was available and reviewed as appropriate.  Description: The posterior dominant rhythm consists of 8 Hz activity of moderate voltage (25-35 uV) seen predominantly in posterior head regions, symmetric and reactive to eye opening and eye closing. EEG showed intermittent generalized 3 to 6 Hz theta-delta slowing.  Hyperventilation and photic stimulation were not performed.    Of note, study was technically difficult due to significant electrode and movement artifact.   ABNORMALITY - Intermittent slow, generalized  IMPRESSION: This study is suggestive of mild diffuse encephalopathy. No seizures or epileptiform discharges were seen throughout the recording.  [name] Annabelle Harman

## 2023-06-16 DIAGNOSIS — E663 Overweight: Secondary | ICD-10-CM

## 2023-06-16 DIAGNOSIS — J449 Chronic obstructive pulmonary disease, unspecified: Secondary | ICD-10-CM

## 2023-06-16 DIAGNOSIS — G9341 Metabolic encephalopathy: Secondary | ICD-10-CM | POA: Diagnosis not present

## 2023-06-16 DIAGNOSIS — I10 Essential (primary) hypertension: Secondary | ICD-10-CM | POA: Diagnosis not present

## 2023-06-16 LAB — COMPREHENSIVE METABOLIC PANEL
ALT: 14 U/L (ref 0–44)
AST: 17 U/L (ref 15–41)
Albumin: 3.2 g/dL — ABNORMAL LOW (ref 3.5–5.0)
Alkaline Phosphatase: 44 U/L (ref 38–126)
Anion gap: 9 (ref 5–15)
BUN: 12 mg/dL (ref 8–23)
CO2: 25 mmol/L (ref 22–32)
Calcium: 9.4 mg/dL (ref 8.9–10.3)
Chloride: 105 mmol/L (ref 98–111)
Creatinine, Ser: 0.76 mg/dL (ref 0.44–1.00)
GFR, Estimated: >60 mL/min (ref >60)
Glucose, Bld: 163 mg/dL — ABNORMAL HIGH (ref 70–99)
Potassium: 3.5 mmol/L (ref 3.5–5.1)
Sodium: 139 mmol/L (ref 135–145)
Total Bilirubin: 0.6 mg/dL (ref <1.2)
Total Protein: 6.7 g/dL (ref 6.5–8.1)

## 2023-06-16 LAB — CBC WITH DIFFERENTIAL/PLATELET
Abs Immature Granulocytes: 0.03 10*3/uL (ref 0.00–0.07)
Basophils Absolute: 0.1 10*3/uL (ref 0.0–0.1)
Basophils Relative: 1 %
Eosinophils Absolute: 0.1 10*3/uL (ref 0.0–0.5)
Eosinophils Relative: 2 %
HCT: 37.8 % (ref 36.0–46.0)
Hemoglobin: 12 g/dL (ref 12.0–15.0)
Immature Granulocytes: 0 %
Lymphocytes Relative: 20 %
Lymphs Abs: 1.5 10*3/uL (ref 0.7–4.0)
MCH: 26.1 pg (ref 26.0–34.0)
MCHC: 31.7 g/dL (ref 30.0–36.0)
MCV: 82.2 fL (ref 80.0–100.0)
Monocytes Absolute: 0.6 10*3/uL (ref 0.1–1.0)
Monocytes Relative: 8 %
Neutro Abs: 5.5 10*3/uL (ref 1.7–7.7)
Neutrophils Relative %: 69 %
Platelets: 236 10*3/uL (ref 150–400)
RBC: 4.6 MIL/uL (ref 3.87–5.11)
RDW: 14.9 % (ref 11.5–15.5)
WBC: 7.8 10*3/uL (ref 4.0–10.5)
nRBC: 0 % (ref 0.0–0.2)

## 2023-06-16 LAB — GLUCOSE, CAPILLARY
Glucose-Capillary: 108 mg/dL — ABNORMAL HIGH (ref 70–99)
Glucose-Capillary: 204 mg/dL — ABNORMAL HIGH (ref 70–99)
Glucose-Capillary: 165 mg/dL — ABNORMAL HIGH (ref 70–99)
Glucose-Capillary: 168 mg/dL — ABNORMAL HIGH (ref 70–99)
Glucose-Capillary: 121 mg/dL — ABNORMAL HIGH (ref 70–99)

## 2023-06-16 LAB — MAGNESIUM: Magnesium: 1.6 mg/dL — ABNORMAL LOW (ref 1.7–2.4)

## 2023-06-16 LAB — PHOSPHORUS: Phosphorus: 2.1 mg/dL — ABNORMAL LOW (ref 2.5–4.6)

## 2023-06-16 MED ORDER — K PHOS MONO-SOD PHOS DI & MONO 155-852-130 MG PO TABS
500.0000 mg | ORAL_TABLET | Freq: Two times a day (BID) | ORAL | Status: AC
  Administered 2023-06-16 (×2): 500 mg via ORAL
  Filled 2023-06-16 (×2): qty 2

## 2023-06-16 MED ORDER — MAGNESIUM SULFATE 2 GM/50ML IV SOLN
2.0000 g | Freq: Once | INTRAVENOUS | Status: AC
  Administered 2023-06-16: 2 g via INTRAVENOUS
  Filled 2023-06-16: qty 50

## 2023-06-16 MED ORDER — POTASSIUM CHLORIDE CRYS ER 20 MEQ PO TBCR
20.0000 meq | EXTENDED_RELEASE_TABLET | Freq: Two times a day (BID) | ORAL | Status: DC
  Administered 2023-06-16 – 2023-06-18 (×5): 20 meq via ORAL
  Filled 2023-06-16 (×5): qty 1

## 2023-06-16 MED ORDER — HALOPERIDOL LACTATE 5 MG/ML IJ SOLN
1.0000 mg | Freq: Four times a day (QID) | INTRAMUSCULAR | Status: DC | PRN
  Administered 2023-06-16 – 2023-06-17 (×2): 1 mg via INTRAVENOUS
  Filled 2023-06-16 (×2): qty 1

## 2023-06-16 MED ORDER — ZIPRASIDONE MESYLATE 20 MG IM SOLR
20.0000 mg | Freq: Once | INTRAMUSCULAR | Status: AC
  Administered 2023-06-16: 20 mg via INTRAMUSCULAR
  Filled 2023-06-16: qty 20

## 2023-06-16 NOTE — Progress Notes (Signed)
The patient is making a fix to hit this RN. Called for more staff for help. She heard a female patient saing help and she thinks it's her bro

## 2023-06-16 NOTE — Progress Notes (Signed)
The patient is so confused, impulsive and jumpimg out of bed. She was given haldol 1 mg IV at 5:27 pm and Seroquel 25 mg at 5:30 pm. Both medications were ineffective. I'm concerned she's going to fall. Notified Dr. Arlean Hopping and received new order as seen in the [facility].

## 2023-06-16 NOTE — Evaluation (Signed)
Occupational Therapy Evaluation Patient Details Name: Michelle [name] MRN: [id number] DOB: Dec 07, 1932 Today's Date: 06/16/2023   History of Present Illness 89 yo female presents to ED on 11/27 with episode of unresponsiveness and then became unresponsive and lethargic. CT and MRI unremarkable for acute findings EEG reveals acute encephalopathy, no seizure like activity, Found to have Hypomagnesemia /hypophosphatemia PMH includes HTN, seizures, DMII, HLD, CAD, COPD   Clinical Impression   Pt recently discharged form SNF to home where she was using her RW/wc with S and son assisted with ADL tasks as needed.  Pt did better during OT session and was able to mobilize with min A @ RW level to bathroom. Overall Mod A with LB ADL tasks due to below listed deficits. Patient will benefit from continued inpatient follow up therapy, <3 hours/day to maximize functional level of independence to facilitate safe DC home. Family hopeful for progression home if able. Acute OT to follow.       If plan is discharge home, recommend the following: A lot of help with walking and/or transfers;A lot of help with bathing/dressing/bathroom;Assistance with cooking/housework;Direct supervision/assist for medications management;Direct supervision/assist for financial management;Assist for transportation;Help with stairs or ramp for entrance    Functional Status Assessment  Patient has had a recent decline in their functional status and demonstrates the ability to make significant improvements in function in a reasonable and predictable amount of time.  Equipment Recommendations  None recommended by OT    Recommendations for Other Services       Precautions / Restrictions Precautions Precautions: Fall Restrictions Weight Bearing Restrictions: No      Mobility Bed Mobility               General bed mobility comments: OOB in chair    Transfers Overall transfer level: Needs assistance Equipment used:  Rolling walker (2 wheels) Transfers: Sit to/from Stand Sit to Stand: Min assist                  Balance     Sitting balance-Leahy Scale: Fair       Standing balance-Leahy Scale: Poor                             ADL either performed or assessed with clinical judgement   ADL Overall ADL's : Needs assistance/impaired Eating/Feeding: Set up   Grooming: Set up   Upper Body Bathing: Set up;Supervision/ safety;Sitting   Lower Body Bathing: Moderate assistance;Sit to/from stand   Upper Body Dressing : Minimal assistance;Sitting   Lower Body Dressing: Moderate assistance;Sit to/from stand Lower Body Dressing Details (indicate cue type and reason): able to copmlete figure four positioning for socks Toilet Transfer: Minimal assistance;Ambulation;BSC/3in1 (over commode)   Toileting- Clothing Manipulation and Hygiene: Total assistance Toileting - Clothing Manipulation Details (indicate cue type and reason): incontinenet of urine;wears briefs     Functional mobility during ADLs: Minimal assistance;Rolling walker (2 wheels);Cueing for safety       Vision Baseline Vision/History: 1 Wears glasses Vision Assessment?: No apparent visual deficits;Wears glasses for reading     Perception         Praxis         Pertinent Vitals/Pain Pain Assessment Pain Assessment: Faces Faces Pain Scale: Hurts little more Pain Location: knees with standing Pain Descriptors / Indicators: Aching, Sore, Grimacing Pain Intervention(s): Limited activity within patient's tolerance     Extremity/Trunk Assessment Upper Extremity Assessment Upper Extremity Assessment: Generalized weakness  Lower Extremity Assessment Lower Extremity Assessment: Defer to PT evaluation RLE Deficits / Details: lacks full knee ext, flex and dorsiflex functionally LLE Deficits / Details: lacks full knee ext, flex and dorsiflex functionally   Cervical / Trunk Assessment Cervical / Trunk  Assessment: Kyphotic   Communication Communication Communication: No apparent difficulties Cueing Techniques: Verbal cues;Tactile cues;Visual cues;Gestural cues   Cognition Arousal: Alert Behavior During Therapy: Flat affect Overall Cognitive Status: Impaired/Different from baseline Area of Impairment: Memory, Following commands, Safety/judgement, Awareness, Problem solving, Orientation, Attention                 Orientation Level: Disoriented to, Situation, Place, Time (can't remember birthday) Current Attention Level: Selective Memory: Decreased short-term memory Following Commands: Follows one step commands consistently Safety/Judgement: Decreased awareness of safety, Decreased awareness of deficits Awareness: Intellectual Problem Solving: Slow processing, Decreased initiation, Difficulty sequencing, Requires verbal cues, Requires tactile cues General Comments: increased time for processing and initiation     General Comments  VSS on RA    Exercises     Shoulder Instructions      Home Living Family/patient expects to be discharged to:: Private residence Living Arrangements: Children Available Help at Discharge: Family;Available 24 hours/day Type of Home: House Home Access: Ramped entrance     Home Layout: One level     Bathroom Shower/Tub: Producer, television/film/video: Standard Bathroom Accessibility: Yes How Accessible: Accessible via walker Home Equipment: Rollator (4 wheels);Rolling Walker (2 wheels);BSC/3in1;Shower seat;Wheelchair - manual   Additional Comments: lives with son who uses a cane      Prior Functioning/Environment Prior Level of Function : Needs assist       Physical Assist : Mobility (physical);ADLs (physical)     Mobility Comments: uses RW for very short ambulation mainly uses wheelchair ADLs Comments: needs min assist for bathing, son assisting with set up        OT Problem List: Decreased strength;Decreased range of  motion;Decreased activity tolerance;Impaired balance (sitting and/or standing);Decreased safety awareness;Decreased knowledge of use of DME or AE;Decreased knowledge of precautions;Decreased cognition      OT Treatment/Interventions: Self-care/ADL training;Therapeutic exercise;DME and/or AE instruction;Therapeutic activities;Patient/family education;Balance training    OT Goals(Current goals can be found in the care plan section) Acute Rehab OT Goals Patient Stated Goal: go to the bathroom OT Goal Formulation: With patient Time For Goal Achievement: 06/30/23 Potential to Achieve Goals: Good  OT Frequency: Min 1X/week    Co-evaluation              AM-PAC OT "6 Clicks" Daily Activity     Outcome Measure Help from another person eating meals?: A Little Help from another person taking care of personal grooming?: A Little Help from another person toileting, which includes using toliet, bedpan, or urinal?: Total Help from another person bathing (including washing, rinsing, drying)?: A Lot Help from another person to put on and taking off regular upper body clothing?: A Little Help from another person to put on and taking off regular lower body clothing?: A Lot 6 Click Score: 14   End of Session Equipment Utilized During Treatment: Gait belt;Rolling walker (2 wheels) Nurse Communication: Mobility status (encourage mobility)  Activity Tolerance: Patient tolerated treatment well Patient left: in chair;with call bell/phone within reach;with chair alarm set;with nursing/sitter in room  OT Visit Diagnosis: Unsteadiness on feet (R26.81);Other abnormalities of gait and mobility (R26.89);Muscle weakness (generalized) (M62.81);Other symptoms and signs involving cognitive function;Pain Pain - Right/Left:  (B) Pain - part of body: Knee  Time: 1133-1203 OT Time Calculation (min): 30 min Charges:  OT General Charges $OT Visit: 1 Visit OT Evaluation $OT Eval Moderate Complexity:  1 Mod OT Treatments $Self Care/Home Management : 8-22 mins  Luisa Dago, OT/L   Acute OT Clinical Specialist Acute Rehabilitation Services Pager [phone] Office [phone]   [facility] 06/16/2023, 12:17 PM

## 2023-06-16 NOTE — Progress Notes (Signed)
Patient confused and unable to reorient her. Refuses telemetry. She's sitting at the edge of her chair and refuses repositioning despite safety education. Dr Robb Matar notified of clinical status, see ENR for orders received. Central telemetry updated on status.

## 2023-06-16 NOTE — Plan of Care (Signed)
  Problem: Nutritional: Goal: Maintenance of adequate nutrition will improve Outcome: Progressing Goal: Progress toward achieving an optimal weight will improve Outcome: Progressing   Problem: Skin Integrity: Goal: Risk for impaired skin integrity will decrease Outcome: Progressing   Problem: Activity: Goal: Risk for activity intolerance will decrease Outcome: Progressing   Problem: Pain Management: Goal: General experience of comfort will improve Outcome: Progressing   Problem: Elimination: Goal: Will not experience complications related to bowel motility Outcome: Progressing Goal: Will not experience complications related to urinary retention Outcome: Progressing

## 2023-06-16 NOTE — Progress Notes (Signed)
Speech Language Pathology Treatment: Dysphagia  Patient Details Name: DELBERTA PILLION MRN: [id number] DOB: 12/07/1932 Today's Date: 06/16/2023 Time: 8657-8469 SLP Time Calculation (min) (ACUTE ONLY): 12 min  Assessment / Plan / Recommendation Clinical Impression  Pt observed sitting upright in the chair without subjective complaints of diet texture. Pt self-fed trials of regular texture solids and thin liquids with mildly prolonged mastication, but otherwise no s/s concerning for aspiration. Discussed options for diet modification with pt and her son, who both agree to continue current diet. SLP placed lunch order for pt. Recommend continuing Dys 3 texture solids with thin liquids. No further f/u is warranted at this time as this diet most closely resembles pt's baseline diet due to edentulism. Will s/o.   HPI HPI: JANAJA ALCANTARA is an 89 yo female presenting to ED 11/27 after seizure activity with AMS. MRI Brain without evidence of acute infarct. EEG suggestive of mild diffuse encephalopathy without evidence of epileptiform discharges. Most recently seen by SLP 02/2023 with recommendations for a Dys 2 consistency diet. PMH includes seizure disorder, dementia, HTN, anginal pain, AV block, CAD, COPD, T2DM, HLD, MI s/p coronary artery stent (2015)      SLP Plan  All goals met      Recommendations for follow up therapy are one component of a multi-disciplinary discharge planning process, led by the attending physician.  Recommendations may be updated based on patient status, additional functional criteria and insurance authorization.    Recommendations  Diet recommendations: Dysphagia 3 (mechanical soft);Thin liquid Liquids provided via: Cup;Straw Medication Administration: Whole meds with puree Supervision: Patient able to self feed;Intermittent supervision to cue for compensatory strategies Compensations: Minimize environmental distractions;Slow rate;Small sips/bites Postural Changes  and/or Swallow Maneuvers: Seated upright 90 degrees                  Oral care BID;Staff/trained caregiver to provide oral care   Frequent or constant Supervision/Assistance Dysphagia, unspecified (R13.10)     All goals met     Gwynneth Aliment, M.A., CF-SLP Speech Language Pathology, Acute Rehabilitation Services  Secure Chat preferred [phone]   06/16/2023, 1:31 PM

## 2023-06-16 NOTE — Evaluation (Signed)
Physical Therapy Evaluation Patient Details Name: Michelle [name] MRN: [id number] DOB: 28-Mar-1933 Today's Date: 06/16/2023  History of Present Illness  89 yo female presents to ED on 11/27 with episode of unresponsiveness and then became unresponsive and lethargic. CT and MRI unremarkable for acute findings EEG reveals acute encephalopathy, no seizure like activity, Found to have Hypomagnesemia /hypophosphatemia PMH includes HTN, seizures, DMII, HLD, CAD, COPD  Clinical Impression  Pt had been home from [facility] for 3 days before admission to [facility]. Pt poor historian so son provided information about home set up, with ramped entrance and pt bed and bath on first floor. Son reports that pt mainly transfers from bed to wheelchair, wheelchair to recliner and only real ambulation is when she parks wheelchair at door to bathroom and uses vanity and grab bars to get to the toilet. Pt is currently limited in safe mobility by decreased safety awareness and generalized weakness. Pt is modA for bed mobility and transfers and modAx2 for stepping transfer from [facility] to recliner with RW. Patient will benefit from continued inpatient follow up therapy, <3 hours/day, however will not qualify for readmission to SNF for another 57 days. Pt needs physical assist for daily care that sons are not able to provide. Pt will need HHAide, if pt is to go home. PT will continue to work with pt acutely.        If plan is discharge home, recommend the following: Two people to help with walking and/or transfers;Two people to help with bathing/dressing/bathroom   Can travel by private vehicle   No    Equipment Recommendations None recommended by PT  Recommendations for Other Services       Functional Status Assessment Patient has had a recent decline in their functional status and demonstrates the ability to make significant improvements in function in a reasonable and predictable amount of time.     Precautions  / Restrictions Precautions Precautions: Fall Restrictions Weight Bearing Restrictions: No      Mobility  Bed Mobility Overal bed mobility: Needs Assistance Bed Mobility: Supine to Sit     Supine to sit: HOB elevated, Mod assist     General bed mobility comments: min A for bringing trunk to upright, requires modA to scoot hips to EoB with bed pad    Transfers Overall transfer level: Needs assistance Equipment used: 1 person hand held assist, Rolling walker (2 wheels) Transfers: Sit to/from Stand, Bed to chair/wheelchair/BSC Sit to Stand: Min assist   Step pivot transfers: Mod assist Squat pivot transfers: Mod assist     General transfer comment: modA for squat pivot from bed to BSC, min A for power up to standing for pericare, anly able to stand for bouts of 30-45 sec, requires 2 bouts of standing for  peri care, modAx2 for stepping transfer from [facility] to recliner, needs assist to guide hips around to chair.    Ambulation/Gait               General Gait Details: unable at this time        Balance Overall balance assessment: Needs assistance Sitting-balance support: No upper extremity supported, Feet supported Sitting balance-Leahy Scale: Fair Sitting balance - Comments: supervision for safety, with sitting on BSC   Standing balance support: Bilateral upper extremity supported, During functional activity, Reliant on assistive device for balance Standing balance-Leahy Scale: Poor Standing balance comment: reliant on UE support  Pertinent Vitals/Pain Pain Assessment Pain Assessment: Faces Faces Pain Scale: Hurts little more Pain Location: knees with standing Pain Descriptors / Indicators: Aching, Sore, Grimacing Pain Intervention(s): Limited activity within patient's tolerance, Monitored during session, Repositioned    Home Living Family/patient expects to be discharged to:: Private residence Living Arrangements:  Children Available Help at Discharge: Family;Available 24 hours/day Type of Home: House Home Access: Ramped entrance       Home Layout: One level Home Equipment: Rollator (4 wheels);Rolling Walker (2 wheels);BSC/3in1;Shower seat;Wheelchair - manual Additional Comments: lives with son    Prior Function Prior Level of Function : Needs assist       Physical Assist : Mobility (physical);ADLs (physical)     Mobility Comments: uses RW for very short ambulation mainly uses wheelchair ADLs Comments: needs assist for most ADLs, son assisting with set up for breakfast     Extremity/Trunk Assessment   Upper Extremity Assessment Upper Extremity Assessment: Defer to OT evaluation    Lower Extremity Assessment Lower Extremity Assessment: Generalized weakness;RLE deficits/detail;LLE deficits/detail RLE Deficits / Details: lacks full knee ext, flex and dorsiflex functionally LLE Deficits / Details: lacks full knee ext, flex and dorsiflex functionally    Cervical / Trunk Assessment Cervical / Trunk Assessment: Kyphotic  Communication   Communication Communication: No apparent difficulties Cueing Techniques: Verbal cues;Tactile cues;Visual cues;Gestural cues  Cognition Arousal: Alert Behavior During Therapy: Flat affect Overall Cognitive Status: Impaired/Different from baseline Area of Impairment: Memory, Following commands, Safety/judgement, Awareness, Problem solving                 Orientation Level: Disoriented to, Situation, Place, Time (can't remember birthday)   Memory: Decreased short-term memory Following Commands: Follows one step commands with increased time Safety/Judgement: Decreased awareness of safety, Decreased awareness of deficits Awareness: Intellectual Problem Solving: Slow processing, Decreased initiation, Difficulty sequencing, Requires verbal cues, Requires tactile cues General Comments: increased time for processing and initiation        General  Comments General comments (skin integrity, edema, etc.): VSS on RA        Assessment/Plan    PT Assessment Patient needs continued PT services  PT Problem List Decreased strength;Decreased activity tolerance;Decreased balance;Decreased range of motion;Decreased mobility;Decreased coordination;Decreased cognition;Decreased safety awareness       PT Treatment Interventions DME instruction;Gait training;Functional mobility training;Therapeutic activities;Therapeutic exercise;Balance training;Cognitive remediation;Patient/family education;Wheelchair mobility training    PT Goals (Current goals can be found in the Care Plan section)  Acute Rehab PT Goals PT Goal Formulation: With patient Time For Goal Achievement: 06/30/23 Potential to Achieve Goals: Fair    Frequency Min 1X/week        AM-PAC PT "6 Clicks" Mobility  Outcome Measure Help needed turning from your back to your side while in a flat bed without using bedrails?: A Little Help needed moving from lying on your back to sitting on the side of a flat bed without using bedrails?: A Lot Help needed moving to and from a bed to a chair (including a wheelchair)?: A Lot Help needed standing up from a chair using your arms (e.g., wheelchair or bedside chair)?: A Lot Help needed to walk in hospital room?: Total Help needed climbing 3-5 steps with a railing? : Total 6 Click Score: 11    End of Session Equipment Utilized During Treatment: Gait belt Activity Tolerance: Patient tolerated treatment well Patient left: in chair;with call bell/phone within reach;with chair alarm set;with nursing/sitter in room Nurse Communication: Mobility status PT Visit Diagnosis: Unsteadiness on feet (R26.81);Other abnormalities  of gait and mobility (R26.89);Muscle weakness (generalized) (M62.81);Difficulty in walking, not elsewhere classified (R26.2);Other symptoms and signs involving the nervous system (R29.898)    Time: 1027-2536 PT Time  Calculation (min) (ACUTE ONLY): 51 min   Charges:   PT Evaluation $PT Eval Low Complexity: 1 Low PT Treatments $Therapeutic Activity: 23-37 mins PT General Charges $$ ACUTE PT VISIT: 1 Visit         Yamili Lichtenwalner B. Beverely Risen PT, DPT Acute Rehabilitation Services Please use secure chat or  Call Office [phone]   [facility] 06/16/2023, 12:09 PM

## 2023-06-16 NOTE — Progress Notes (Signed)
[facility] PROGRESS NOTE    Progress Note  Michelle [name]  AYT:[id number] DOB: 1933/02/12 DOA: 06/14/2023 PCP: Shelva Majestic, MD     Brief Narrative:   Michelle [name] is an 89 y.o. female past medical history of dementia and disorder wheelchair-bound most of the history was obtained from the chart and son went to the bathroom when getting up from the bathroom starting having grimacing face right word gaze for about 30 seconds then became unresponsive to verbal stimuli no loss of consciousness, no incontinence or biting of the tongue she eventually became unresponsive and lethargic.  As there was a concern of seizure activity she was given Ativan and Keppra in the ED neurology was consulted   Assessment/Plan:   Acute metabolic encephalopathy: Initially there was a concern for seizures. CT of the head was unremarkable.  MRI of the brain showed no acute findings. CT angio of head and neck showed no large vessel occlusion. EEG showed diffuse encephalopathy no seizures appreciated. UDS was negative. Neurology was consulted recommended Lamictal twice a day. Patient was recently started on nortriptyline which has been stopped. PT OT eval is pending.  Hypomagnesemia /hypophosphatemia: Replete recheck in the morning. Type 2 diabetes mellitus without complication, with long-term current use of insulin (HCC) Only on metformin at home.  Hemoglobin A1c 6.4 continue sliding scale insulin.  Essential hypertension: Blood pressure is elevated, she is currently on Coreg, and lisinopril. Continue to monitor further titration as an outpatient, she is greater than 13 years old goal blood pressure should be less than 160/90.  COPD (chronic obstructive pulmonary disease) (HCC) Continue home dose of inhalers.  Hyperlipidemia: She no longer takes fenofibrate.  CAD: Continue aspirin Plavix beta-blocker and ACE inhibitor.  Stage II significant results are present on admission RN  Pressure Injury Documentation: Pressure Injury 02/07/23 Sacrum Mid Stage 2 -  Partial thickness loss of dermis presenting as a shallow open injury with a red, pink wound bed without slough. small circular area of broken skin without drainage (Active)  02/07/23 1500  Location: Sacrum  Location Orientation: Mid  Staging: Stage 2 -  Partial thickness loss of dermis presenting as a shallow open injury with a red, pink wound bed without slough.  Wound Description (Comments): small circular area of broken skin without drainage  Present on Admission: Yes     DVT prophylaxis: lovenox Family Communication:none Status is: Inpatient Remains inpatient appropriate because: Acute metabolic encephalopathy    Code Status:     Code Status Orders  (From admission, onward)           Start     Ordered   06/14/23 1858  Do not attempt resuscitation (DNR)- Limited -Do Not Intubate (DNI)  Continuous       Question Answer Comment  If pulseless and not breathing No CPR or chest compressions.   In Pre-Arrest Conditions (Patient Is Breathing and Has A Pulse) Do not intubate. Provide all appropriate non-invasive medical interventions. Avoid ICU transfer unless indicated or required.   Consent: Discussion documented in EHR or advanced directives reviewed      06/14/23 1901           Code Status History     Date Active Date Inactive Code Status Order ID Comments User Context   05/17/2023 1452 05/26/2023 1653 Limited: Do not attempt resuscitation (DNR) -DNR-LIMITED -Do Not Intubate/DNI  [id number]  Carollee Herter, DO ED   05/17/2023 1440 05/17/2023 1452 Limited: Do not attempt resuscitation (DNR) -DNR-LIMITED -Do Not  Intubate/DNI  [id number]  Carollee Herter, DO ED   02/15/2023 1336 02/18/2023 2225 DNR [id number]  Dimple Nanas, MD Inpatient   02/06/2023 0405 02/15/2023 1336 Full Code [id number]  Briant Sites, DO ED   08/12/2020 1506 09/01/2020 1825 Full Code [id number]  Clydie Braun, MD ED   06/25/2020  0417 06/25/2020 2144 Full Code [id number]  Hillary Bow, DO ED   12/14/2014 0929 12/15/2014 1306 Full Code [id number]  Elgergawy, Leana Roe, MD Inpatient   03/13/2014 2242 03/17/2014 1553 Full Code [id number]  Doree Albee, MD ED   03/05/2014 1251 03/06/2014 1443 Full Code [id number]  Quintella Reichert, MD Inpatient   02/18/2014 1041 02/21/2014 1520 Full Code [id number]  Corky Crafts, MD Inpatient   02/18/2014 0745 02/18/2014 1041 Full Code [id number]  Marykay Lex, MD ED      Advance Directive Documentation    Flowsheet Row Most Recent Value  Type of Advance Directive Healthcare Power of Attorney, Out of facility DNR (pink MOST or yellow form), Living will  Pre-existing out of facility DNR order (yellow form or pink MOST form) Yellow form placed in chart (order not valid for inpatient use)  "MOST" Form in Place? --         IV Access:   Peripheral IV   Procedures and diagnostic studies:   EEG adult  Result Date: 06/15/2023 Charlsie Quest, MD     06/15/2023 10:37 AM Patient Name: Michelle [name] MRN: [id number] Epilepsy Attending: Charlsie Quest Referring Physician/Provider: Marjorie Smolder, NP Date: 06/15/2023 Duration: 23.55 mins Patient history: 89yo F with history of recent admission for breakthrough seizures with prior history of seizures, diabetes, COPD, modified Rankin score of 4 at baseline, presenting with concern for depressed mental status after a staring spell this morning concerning for seizure. EEG to evaluate for seizure Level of alertness: Awake AEDs during EEG study: LTG., Ativan Technical aspects: This EEG study was done with scalp electrodes positioned according to the 10-20 International system of electrode placement. Electrical activity was reviewed with band pass filter of 1-70Hz , sensitivity of 7 uV/mm, display speed of 64mm/sec with a 60Hz  notched filter applied as appropriate. EEG data were recorded continuously and digitally stored.  Video monitoring was  available and reviewed as appropriate. Description: The posterior dominant rhythm consists of 8 Hz activity of moderate voltage (25-35 uV) seen predominantly in posterior head regions, symmetric and reactive to eye opening and eye closing. EEG showed intermittent generalized 3 to 6 Hz theta-delta slowing.  Hyperventilation and photic stimulation were not performed.  Of note, study was technically difficult due to significant electrode and movement artifact. ABNORMALITY - Intermittent slow, generalized IMPRESSION: This study is suggestive of mild diffuse encephalopathy. No seizures or epileptiform discharges were seen throughout the recording. Charlsie Quest   MR BRAIN WO CONTRAST  Result Date: 06/15/2023 CLINICAL DATA:  Stroke follow-up.  Altered mental status. EXAM: MRI HEAD WITHOUT CONTRAST TECHNIQUE: Multiplanar, multiecho pulse sequences of the brain and surrounding structures were obtained without intravenous contrast. COMPARISON:  CTA of the head neck from yesterday FINDINGS: Truncated and motion degraded study, multiple attempts were made for diagnostic images. Axial diffusion imaging is degraded but diagnostic and negative for infarct. Major flow voids are preserved on T2 weighted imaging. No evidence of hemorrhage, hydrocephalus, or mass. IMPRESSION: Truncated and motion degraded brain MRI without acute finding including infarct. Electronically Signed   By: Tiburcio Pea M.D.   On: 06/15/2023 09:07   DG CHEST  PORT 1 VIEW  Result Date: 06/14/2023 CLINICAL DATA:  Cough and altered mental status. EXAM: PORTABLE CHEST 1 VIEW COMPARISON:  May 17, 2023 FINDINGS: The heart size and mediastinal contours are within normal limits. There is marked severity calcification of the thoracic aorta. Mild, diffuse, chronic appearing increased lung markings are seen with mild atelectatic changes suspected within the left lung base. No pleural effusion or pneumothorax is identified. Multilevel degenerative  changes seen throughout the thoracic spine. IMPRESSION: Chronic appearing increased lung markings with mild left basilar atelectasis. Electronically Signed   By: Aram Candela M.D.   On: 06/14/2023 18:36   CT ANGIO HEAD NECK W WO CM W PERF (CODE STROKE)  Result Date: 06/14/2023 CLINICAL DATA:  Altered mental status. Seizure at 8 o'clock a.m. Dementia. EXAM: CT ANGIOGRAPHY HEAD AND NECK CT PERFUSION BRAIN TECHNIQUE: Multidetector CT imaging of the head and neck was performed using the standard protocol during bolus administration of intravenous contrast. Multiplanar CT image reconstructions and MIPs were obtained to evaluate the vascular anatomy. Carotid stenosis measurements (when applicable) are obtained utilizing NASCET criteria, using the distal internal carotid diameter as the denominator. Multiphase CT imaging of the brain was performed following IV bolus contrast injection. Subsequent parametric perfusion maps were calculated using RAPID software. RADIATION DOSE REDUCTION: This exam was performed according to the departmental dose-optimization program which includes automated exposure control, adjustment of the mA and/or kV according to patient size and/or use of iterative reconstruction technique. CONTRAST:  OMNIPAQUE IOHEXOL 350 MG/ML SOLN COMPARISON:  CT head without contrast 05/17/2023 MR head without contrast 06/25/2020. FINDINGS: CTA NECK FINDINGS Aortic arch: Study is moderately degraded by patient motion. Atherosclerotic changes are present at the aortic arch and great vessel origins. A 50% stenosis at the origin of the left subclavian is suspected. No other definite stenosis are present. Right carotid system: The right common carotid artery is within normal limits. Bifurcation demonstrates some atherosclerotic change without focal stenosis. Mild tortuosity is present cervical right ICA without significant stenosis of greater than 50%. Left carotid system: The left common carotid artery  is within normal limits. More pronounced atherosclerotic calcifications are present bifurcation proximal left ICA without significant stenosis. Mild tortuosity is present in the cervical left ICA without significant stenosis. Vertebral arteries: The vertebral arteries are not well seen in the V1 or V2 segments due to patient motion. The more distal cervical vertebral arteries are within normal limits bilaterally. Skeleton: Multilevel degenerative changes are present cervical spine. Ankylosis with grade 1 anterolisthesis is present at C6-7. No focal osseous lesions are present. Other neck: Soft tissues the neck are otherwise unremarkable. Salivary glands are within normal limits. Thyroid is normal. No significant adenopathy is present. No focal mucosal or submucosal lesions are present. Upper chest: The lung apices are clear. The thoracic inlet is within normal limits. Review of the MIP images confirms the above findings CTA HEAD FINDINGS Anterior circulation: Atherosclerotic calcifications are present within the cavernous internal carotid arteries bilaterally without significant stenosis through the ICA termini. The right A1 segment is aplastic. Both A2 segments fill from the left. M1 segments are normal bilaterally. The MCA bifurcations are normal. The ACA and MCA branch vessels are within normal limits. No aneurysm is present. Posterior circulation: PICA origins are visualized and normal. The vertebrobasilar junction basilar artery normal. The superior cerebellar arteries are patent. Both posterior cerebral arteries originate from basilar tip. The PCA branch vessels are normal bilaterally. No aneurysm is present. Venous sinuses: The dural sinuses are patent.  The straight sinus and deep cerebral veins are intact. Cortical veins are within normal limits. No significant vascular malformation is evident. Anatomic variants: None Review of the MIP images confirms the above findings CT Brain Perfusion Findings: ASPECTS:  10/10 CBF (<30%) Volume: 0mL Perfusion (Tmax>6.0s) volume: 0mL IMPRESSION: 1. No emergent large vessel occlusion. 2. Atherosclerotic changes at the carotid bifurcations and cavernous internal carotid arteries bilaterally without significant stenosis. 3. The vertebral arteries are not well seen in the V1 or V2 segments due to patient motion. The more distal cervical vertebral arteries are within normal limits bilaterally. 4. A 50% stenosis at the origin of the left subclavian is suspected. 5. Multilevel degenerative changes of the cervical spine. 6. Ankylosis with grade 1 anterolisthesis at C6-7. 7. Normal CT perfusion. 8.  Aortic Atherosclerosis (ICD10-I70.0). Electronically Signed   By: Marin Roberts M.D.   On: 06/14/2023 17:27   CT HEAD CODE STROKE WO CONTRAST  Result Date: 06/14/2023 CLINICAL DATA:  Code stroke. Right-sided gaze. Left-sided weakness. EXAM: CT HEAD WITHOUT CONTRAST TECHNIQUE: Contiguous axial images were obtained from the base of the skull through the vertex without intravenous contrast. RADIATION DOSE REDUCTION: This exam was performed according to the departmental dose-optimization program which includes automated exposure control, adjustment of the mA and/or kV according to patient size and/or use of iterative reconstruction technique. COMPARISON:  CT head without contrast 05/17/2023 FINDINGS: Brain: Mild generalized atrophy and white matter disease is stable. No acute infarct, hemorrhage, or mass lesion is present. The ventricles are proportionate to the degree of atrophy. Deep brain nuclei are within normal limits. Insular cortex is within normal limits bilaterally. The brainstem and cerebellum are within normal limits. Midline structures are within normal limits. Vascular: Atherosclerotic calcifications are present within the cavernous internal carotid arteries and at the dural margin left vertebral artery. No hyperdense vessel is present. Skull: Calvarium is intact. No focal  lytic or blastic lesions are present. No significant extracranial soft tissue lesion is present. Sinuses/Orbits: The paranasal sinuses and mastoid air cells are clear. Bilateral lens replacements are noted. Globes and orbits are otherwise unremarkable. ASPECTS [employer] Stroke Program Early CT Score) - Ganglionic level infarction (caudate, lentiform nuclei, internal capsule, insula, M1-M3 cortex): 7/7 - Supraganglionic infarction (M4-M6 cortex): 3/3 Total score (0-10 with 10 being normal): 10/10 IMPRESSION: 1. No acute intracranial abnormality or significant interval change. 2. Stable atrophy and white matter disease. This likely reflects the sequela of chronic microvascular ischemia. 3. Aspects is 10/10. The above was relayed via text pager to Dr. Marthe Patch on 06/14/2023 at 16:49 . Electronically Signed   By: Marin Roberts M.D.   On: 06/14/2023 16:49     Medical Consultants:   None.   Subjective:    [name] no complaints.  Objective:    Vitals:   06/16/23 0017 06/16/23 0353 06/16/23 0457 06/16/23 0544  BP: (!) 150/68 (!) 179/74 (!) 170/75 (!) 169/73  Pulse: 82 72 79 79  Resp: 18 18    Temp: 99.3 F (37.4 C) 98.9 F (37.2 C)    TempSrc: Oral Oral    SpO2: 100% 97%    Weight:      Height:       SpO2: 97 %   Intake/Output Summary (Last 24 hours) at 06/16/2023 0716 Last data filed at 06/16/2023 0542 Gross per 24 hour  Intake 1328.96 ml  Output 1150 ml  Net 178.96 ml   Filed Weights   06/14/23 1546  Weight: 76 kg  Exam: General exam: In no acute distress. Respiratory system: Good air movement and clear to auscultation. Cardiovascular system: S1 & S2 heard, RRR. No JVD,.  Gastrointestinal system: Abdomen is nondistended, soft and nontender.  Extremities: No pedal edema. Skin: No rashes, lesions or ulcers Psychiatry: No judgment or insight of medical condition.   Data Reviewed:    Labs: Basic Metabolic Panel: Recent Labs  Lab 06/12/23 1433  06/14/23 1630 06/14/23 1637 06/14/23 2129 06/15/23 0625 06/16/23 0500  NA 136 136 136  --  138 139  K 3.8 5.5* 5.8*  --  3.4* 3.5  CL 102 103 108  --  104 105  CO2 26 22  --   --  26 25  GLUCOSE 118* 125* 126*  --  92 163*  BUN 27* 25* 38*  --  17 12  CREATININE 0.99 0.77 0.80  --  0.99 0.76  CALCIUM 9.4 9.8  --   --  9.5 9.4  MG  --   --   --   --   --  1.6*  PHOS  --   --   --  3.4  --  2.1*   GFR Estimated Creatinine Clearance: 49.6 mL/min (by C-G formula based on SCr of 0.76 mg/dL). Liver Function Tests: Recent Labs  Lab 06/12/23 1433 06/14/23 1630 06/16/23 0500  AST 14 28 17   ALT 11 16 14   ALKPHOS 44 44 44  BILITOT 0.3 0.7 0.6  PROT 6.7 7.3 6.7  ALBUMIN 3.7 3.6 3.2*   No results for input(s): "LIPASE", "AMYLASE" in the last 168 hours. No results for input(s): "AMMONIA" in the last 168 hours. Coagulation profile Recent Labs  Lab 06/14/23 1630 06/15/23 0625  INR 1.1 1.2   COVID-19 Labs  No results for input(s): "DDIMER", "FERRITIN", "LDH", "CRP" in the last 72 hours.  Lab Results  Component Value Date   SARSCOV2NAA NEGATIVE 05/17/2023   SARSCOV2NAA NEGATIVE 08/12/2020   SARSCOV2NAA NEGATIVE 06/24/2020    CBC: Recent Labs  Lab 06/12/23 1433 06/14/23 1630 06/14/23 1637 06/15/23 0625 06/16/23 0500  WBC 8.4 7.7  --  6.7 7.8  NEUTROABS 6.0 5.8  --   --  5.5  HGB 11.2* 12.9 13.9 11.7* 12.0  HCT 34.7* 41.7 41.0 35.8* 37.8  MCV 86.4 85.6  --  83.3 82.2  PLT 221.0 191  --  222 236   Cardiac Enzymes: Recent Labs  Lab 06/14/23 2129  CKTOTAL 16*   BNP (last 3 results) No results for input(s): "PROBNP" in the last 8760 hours. CBG: Recent Labs  Lab 06/15/23 0923 06/15/23 1148 06/15/23 1626 06/15/23 2134 06/16/23 0632  GLUCAP 108* 104* 174* 162* 204*   D-Dimer: No results for input(s): "DDIMER" in the last 72 hours. Hgb A1c: No results for input(s): "HGBA1C" in the last 72 hours. Lipid Profile: No results for input(s): "CHOL", "HDL",  "LDLCALC", "TRIG", "CHOLHDL", "LDLDIRECT" in the last 72 hours. Thyroid function studies: No results for input(s): "TSH", "T4TOTAL", "T3FREE", "THYROIDAB" in the last 72 hours.  Invalid input(s): "FREET3" Anemia work up: No results for input(s): "VITAMINB12", "FOLATE", "FERRITIN", "TIBC", "IRON", "RETICCTPCT" in the last 72 hours. Sepsis Labs: Recent Labs  Lab 06/12/23 1433 06/14/23 1630 06/14/23 1638 06/15/23 0625 06/16/23 0500  WBC 8.4 7.7  --  6.7 7.8  LATICACIDVEN  --   --  1.7  --   --    Microbiology No results found for this or any previous visit (from the past 240 hour(s)).   Medications:  aspirin  81 mg Oral Daily   carvedilol  12.5 mg Oral BID WC   clopidogrel  75 mg Oral Daily   enalapril  20 mg Oral QHS   enoxaparin (LOVENOX) injection  40 mg Subcutaneous Q24H   insulin aspart  0-5 Units Subcutaneous QHS   insulin aspart  0-9 Units Subcutaneous TID WC   lamoTRIgine  50 mg Oral BID   LORazepam  1 mg Intravenous Once   mometasone-formoterol  2 puff Inhalation BID   pantoprazole  40 mg Oral Daily   QUEtiapine  25 mg Oral QPM   rosuvastatin  40 mg Oral Daily   umeclidinium bromide  1 puff Inhalation Daily   Continuous Infusions:  sodium bicarbonate 150 mEq in dextrose 5 % 1,150 mL infusion 75 mL/hr at 06/16/23 0058      LOS: 2 days   Marinda Elk  [facility]  06/16/2023, 7:16 AM

## 2023-06-17 DIAGNOSIS — J449 Chronic obstructive pulmonary disease, unspecified: Secondary | ICD-10-CM | POA: Diagnosis not present

## 2023-06-17 DIAGNOSIS — I10 Essential (primary) hypertension: Secondary | ICD-10-CM | POA: Diagnosis not present

## 2023-06-17 DIAGNOSIS — G9341 Metabolic encephalopathy: Secondary | ICD-10-CM | POA: Diagnosis not present

## 2023-06-17 LAB — RENAL FUNCTION PANEL
Albumin: 3.4 g/dL — ABNORMAL LOW (ref 3.5–5.0)
Anion gap: 6 (ref 5–15)
BUN: 15 mg/dL (ref 8–23)
CO2: 25 mmol/L (ref 22–32)
Calcium: 9.3 mg/dL (ref 8.9–10.3)
Chloride: 107 mmol/L (ref 98–111)
Creatinine, Ser: 0.81 mg/dL (ref 0.44–1.00)
GFR, Estimated: >60 mL/min (ref >60)
Glucose, Bld: 129 mg/dL — ABNORMAL HIGH (ref 70–99)
Phosphorus: 3.6 mg/dL (ref 2.5–4.6)
Potassium: 3.6 mmol/L (ref 3.5–5.1)
Sodium: 138 mmol/L (ref 135–145)

## 2023-06-17 LAB — GLUCOSE, CAPILLARY
Glucose-Capillary: 118 mg/dL — ABNORMAL HIGH (ref 70–99)
Glucose-Capillary: 224 mg/dL — ABNORMAL HIGH (ref 70–99)
Glucose-Capillary: 164 mg/dL — ABNORMAL HIGH (ref 70–99)
Glucose-Capillary: 100 mg/dL — ABNORMAL HIGH (ref 70–99)

## 2023-06-17 MED ORDER — CARVEDILOL 12.5 MG PO TABS
25.0000 mg | ORAL_TABLET | Freq: Two times a day (BID) | ORAL | Status: DC
  Administered 2023-06-17 – 2023-06-18 (×2): 25 mg via ORAL
  Filled 2023-06-17 (×3): qty 2

## 2023-06-17 MED ORDER — HYDROCHLOROTHIAZIDE 12.5 MG PO TABS
12.5000 mg | ORAL_TABLET | Freq: Every day | ORAL | Status: DC
  Administered 2023-06-17 – 2023-06-18 (×2): 12.5 mg via ORAL
  Filled 2023-06-17 (×2): qty 1

## 2023-06-17 MED ORDER — POTASSIUM CHLORIDE CRYS ER 20 MEQ PO TBCR
40.0000 meq | EXTENDED_RELEASE_TABLET | Freq: Once | ORAL | Status: AC
  Administered 2023-06-17: 40 meq via ORAL
  Filled 2023-06-17: qty 2

## 2023-06-17 MED ORDER — SPIRONOLACTONE 12.5 MG HALF TABLET
12.5000 mg | ORAL_TABLET | Freq: Every day | ORAL | Status: DC
  Administered 2023-06-17 – 2023-06-18 (×2): 12.5 mg via ORAL
  Filled 2023-06-17 (×2): qty 1

## 2023-06-17 NOTE — Progress Notes (Signed)
Occupational Therapy Treatment Patient Details Name: Michelle [name] MRN: [id number] DOB: [birth year] Today's Date: 06/17/2023   History of present illness 89 yo female presents to ED on 11/27 with episode of unresponsiveness and then became unresponsive and lethargic. CT and MRI unremarkable for acute findings EEG reveals acute encephalopathy, no seizure like activity, Found to have Hypomagnesemia /hypophosphatemia PMH includes HTN, seizures, DMII, HLD, CAD, COPD   OT comments  Pt progressing towards goals this session, needing set up -max A for ADLs, min A for bed mobility and mod A for transfers with RW. Pt keeps trunk flexed, cues to stand upright and step sequencing for transfers. Pt able to transfer to [facility] but needs cues to keep hands on RW as pt frequently reaching for bed rail/other surfaces. Pt presenting with impairments listed below, will follow acutely. Patient will benefit from continued inpatient follow up therapy, <3 hours/day, however if unable will need max HHOT services.        If plan is discharge home, recommend the following:  A lot of help with walking and/or transfers;A lot of help with bathing/dressing/bathroom;Assistance with cooking/housework;Direct supervision/assist for medications management;Direct supervision/assist for financial management;Assist for transportation;Help with stairs or ramp for entrance   Equipment Recommendations  Wheelchair cushion (measurements OT);Wheelchair (measurements OT)    Recommendations for Other Services      Precautions / Restrictions Precautions Precautions: Fall Restrictions Weight Bearing Restrictions: No       Mobility Bed Mobility Overal bed mobility: Needs Assistance Bed Mobility: Supine to Sit Rolling: Min assist         General bed mobility comments: cues to scoot to EOB    Transfers Overall transfer level: Needs assistance Equipment used: Rolling walker (2 wheels) Transfers: Sit to/from Stand Sit to  Stand: Mod assist           General transfer comment: mod A for pivot transfer, pt keeps trunk flexed, cues to stand upright     Balance Overall balance assessment: Needs assistance Sitting-balance support: No upper extremity supported, Feet supported Sitting balance-Leahy Scale: Fair Sitting balance - Comments: supervision for safety, with sitting on BSC   Standing balance support: Bilateral upper extremity supported, During functional activity, Reliant on assistive device for balance Standing balance-Leahy Scale: Poor Standing balance comment: reliant on UE support                           ADL either performed or assessed with clinical judgement   ADL Overall ADL's : Needs assistance/impaired     Grooming: Brushing hair;Sitting;Set up   Upper Body Bathing: Moderate assistance;Sitting Upper Body Bathing Details (indicate cue type and reason): washing upper body on commode     Upper Body Dressing : Minimal assistance Upper Body Dressing Details (indicate cue type and reason): donning gown Lower Body Dressing: Moderate assistance Lower Body Dressing Details (indicate cue type and reason): dons L sock but unable to don R sock without assist Toilet Transfer: Moderate assistance;Stand-pivot;BSC/3in1;Rolling walker (2 wheels)   Toileting- Clothing Manipulation and Hygiene: Maximal assistance       Functional mobility during ADLs: Moderate assistance;Rolling walker (2 wheels)      Extremity/Trunk Assessment Upper Extremity Assessment Upper Extremity Assessment: Generalized weakness   Lower Extremity Assessment Lower Extremity Assessment: Defer to PT evaluation        Vision   Vision Assessment?: No apparent visual deficits;Wears glasses for reading   Perception Perception Perception: Not tested   Praxis Praxis Praxis:  Not tested    Cognition Arousal: Alert Behavior During Therapy: Flat affect Overall Cognitive Status: Impaired/Different from  baseline Area of Impairment: Memory, Following commands, Safety/judgement, Awareness, Problem solving, Orientation, Attention                 Orientation Level: Situation Current Attention Level: Selective Memory: Decreased short-term memory Following Commands: Follows one step commands consistently Safety/Judgement: Decreased awareness of safety, Decreased awareness of deficits Awareness: Emergent Problem Solving: Slow processing, Decreased initiation, Difficulty sequencing, Requires verbal cues, Requires tactile cues General Comments: pt states her son is in the bathroom when son had already left the room        Exercises      Shoulder Instructions       General Comments VSS on RA    Pertinent Vitals/ Pain       Pain Assessment Pain Assessment: No/denies pain  Home Living                                          Prior Functioning/Environment              Frequency           Progress Toward Goals  OT Goals(current goals can now be found in the care plan section)  Progress towards OT goals: Progressing toward goals  Acute Rehab OT Goals Patient Stated Goal: none stated OT Goal Formulation: With patient Time For Goal Achievement: 06/30/23 Potential to Achieve Goals: Good ADL Goals Pt Will Perform Lower Body Bathing: with min assist;sit to/from stand Pt Will Perform Lower Body Dressing: with min assist;sit to/from stand Pt Will Transfer to Toilet: with contact guard assist;ambulating;bedside commode Pt Will Perform Toileting - Clothing Manipulation and hygiene: with contact guard assist;sit to/from stand  Plan      Co-evaluation                 AM-PAC OT "6 Clicks" Daily Activity     Outcome Measure   Help from another person eating meals?: A Little Help from another person taking care of personal grooming?: A Little Help from another person toileting, which includes using toliet, bedpan, or urinal?: A Lot Help from  another person bathing (including washing, rinsing, drying)?: A Lot Help from another person to put on and taking off regular upper body clothing?: A Little Help from another person to put on and taking off regular lower body clothing?: A Lot 6 Click Score: 15    End of Session Equipment Utilized During Treatment: Gait belt;Rolling walker (2 wheels)  OT Visit Diagnosis: Unsteadiness on feet (R26.81);Other abnormalities of gait and mobility (R26.89);Muscle weakness (generalized) (M62.81);Other symptoms and signs involving cognitive function;Pain   Activity Tolerance Patient tolerated treatment well   Patient Left in chair;with call bell/phone within reach;with chair alarm set   Nurse Communication Mobility status        Time: 1445-1511 OT Time Calculation (min): 26 min  Charges: OT General Charges $OT Visit: 1 Visit OT Treatments $Self Care/Home Management : 23-37 mins  Carver Fila, OTD, OTR/L SecureChat Preferred Acute Rehab [phone]   Carver Fila Koonce 06/17/2023, 3:28 PM

## 2023-06-17 NOTE — Progress Notes (Signed)
Patient son at Nurse's station, was informed patient received haldol yesterday/overnight, patient son requests haldol not be given as it has previously made patient a lot more agitated. Patient son requesting it be added as an allergy if necessary to ensure patient does not receive it. Previously patient given ativan and son endorses this has helped, but haldol does not. Charge RN informed Engineer, building services and will notify attending MD

## 2023-06-17 NOTE — Progress Notes (Signed)
[facility] PROGRESS NOTE    Progress Note  Michelle [name]  NWG:[id number] DOB: 07-22-1932 DOA: 06/14/2023 PCP: Shelva Majestic, MD     Brief Narrative:   Michelle [name] is an 89 y.o. female past medical history of dementia and disorder wheelchair-bound most of the history was obtained from the chart and son went to the bathroom when getting up from the bathroom starting having grimacing face right word gaze for about 30 seconds then became unresponsive to verbal stimuli no loss of consciousness, no incontinence or biting of the tongue she eventually became unresponsive and lethargic.  As there was a concern of seizure activity she was given Ativan and Keppra in the ED neurology was consulted   Assessment/Plan:   Acute metabolic encephalopathy possibly due to seizures: Initially there was a concern for seizures. CT of the head was unremarkable.  MRI of the brain showed no acute findings. CT angio of head and neck showed no large vessel occlusion. EEG showed diffuse encephalopathy no seizures appreciated. UDS was negative. Neurology was consulted recommended Lamictal twice a day. Patient was recently started on nortriptyline which has been stopped. PT OT eval, will need skilled nursing facility placement.  TOC involved. According to son patient cannot go to skilled nursing facility as she was just discharged changes more than 60 days at home before returning to skilled nursing facility.  Hypomagnesemia /hypophosphatemia: Repleted now improved.  Type 2 diabetes mellitus without complication, with long-term current use of insulin (HCC) Only on metformin at home.  Hemoglobin A1c 6.4 continue sliding scale insulin.  Essential hypertension: Blood pressure is elevated, she is currently on Coreg, lisinopril, started on hydrochlorothiazide and low-dose Aldactone. Goal blood pressure should  be less than 160/90.  COPD (chronic obstructive pulmonary disease) (HCC) Continue home  dose of inhalers.  Hyperlipidemia: She no longer takes fenofibrate.  CAD: Continue aspirin, Plavix, beta-blocker and ACE inhibitor.  Acute confusional state/sundowning: Family does not want [place] to use Haldol. She received Geodon overnight, use Seroquel at bedtime. Try to potassium greater than 4 magnesium greater than 2.  Stage II significant results are present on admission RN Pressure Injury Documentation: Pressure Injury 02/07/23 Sacrum Mid Stage 2 -  Partial thickness loss of dermis presenting as a shallow open injury with a red, pink wound bed without slough. small circular area of broken skin without drainage (Active)  02/07/23 1500  Location: Sacrum  Location Orientation: Mid  Staging: Stage 2 -  Partial thickness loss of dermis presenting as a shallow open injury with a red, pink wound bed without slough.  Wound Description (Comments): small circular area of broken skin without drainage  Present on Admission: Yes     Pressure Injury 06/16/23 Buttocks Left Stage 2 -  Partial thickness loss of dermis presenting as a shallow open injury with a red, pink wound bed without slough. (Active)  06/16/23 0759  Location: Buttocks  Location Orientation: Left  Staging: Stage 2 -  Partial thickness loss of dermis presenting as a shallow open injury with a red, pink wound bed without slough.  Wound Description (Comments):   Present on Admission:   Dressing Type Foam - Lift dressing to assess site every shift 06/17/23 0430     DVT prophylaxis: lovenox Family Communication:none Status is: Inpatient Remains inpatient appropriate because: Acute metabolic encephalopathy    Code Status:     Code Status Orders  (From admission, onward)           Start  Ordered   06/14/23 1858  Do not attempt resuscitation (DNR)- Limited -Do Not Intubate (DNI)  Continuous       Question Answer Comment  If pulseless and not breathing No CPR or chest compressions.   In Pre-Arrest Conditions  (Patient Is Breathing and Has A Pulse) Do not intubate. Provide all appropriate non-invasive medical interventions. Avoid ICU transfer unless indicated or required.   Consent: Discussion documented in EHR or advanced directives reviewed      06/14/23 1901           Code Status History     Date Active Date Inactive Code Status Order ID Comments User Context   05/17/2023 1452 05/26/2023 1653 Limited: Do not attempt resuscitation (DNR) -DNR-LIMITED -Do Not Intubate/DNI  [id number]  Carollee Herter, DO ED   05/17/2023 1440 05/17/2023 1452 Limited: Do not attempt resuscitation (DNR) -DNR-LIMITED -Do Not Intubate/DNI  [id number]  Carollee Herter, DO ED   02/15/2023 1336 02/18/2023 2225 DNR [id number]  Dimple Nanas, MD Inpatient   02/06/2023 0405 02/15/2023 1336 Full Code [id number]  Briant Sites, DO ED   08/12/2020 1506 09/01/2020 1825 Full Code [id number]  Clydie Braun, MD ED   06/25/2020 0417 06/25/2020 2144 Full Code [id number]  Hillary Bow, DO ED   12/14/2014 0929 12/15/2014 1306 Full Code [id number]  Elgergawy, Leana Roe, MD Inpatient   03/13/2014 2242 03/17/2014 1553 Full Code [id number]  Doree Albee, MD ED   03/05/2014 1251 03/06/2014 1443 Full Code [id number]  Quintella Reichert, MD Inpatient   02/18/2014 1041 02/21/2014 1520 Full Code [id number]  Corky Crafts, MD Inpatient   02/18/2014 0745 02/18/2014 1041 Full Code [id number]  Marykay Lex, MD ED      Advance Directive Documentation    Flowsheet Row Most Recent Value  Type of Advance Directive Healthcare Power of Attorney, Out of facility DNR (pink MOST or yellow form), Living will  Pre-existing out of facility DNR order (yellow form or pink MOST form) Yellow form placed in chart (order not valid for inpatient use)  "MOST" Form in Place? --         IV Access:   Peripheral IV   Procedures and diagnostic studies:   EEG adult  Result Date: 06/15/2023 Charlsie Quest, MD     06/15/2023 10:37 AM Patient Name: Michelle [name] MRN:  [id number] Epilepsy Attending: Charlsie Quest Referring Physician/Provider: Marjorie Smolder, NP Date: 06/15/2023 Duration: 23.55 mins Patient history: 89yo F with history of recent admission for breakthrough seizures with prior history of seizures, diabetes, COPD, modified Rankin score of 4 at baseline, presenting with concern for depressed mental status after a staring spell this morning concerning for seizure. EEG to evaluate for seizure Level of alertness: Awake AEDs during EEG study: LTG., Ativan Technical aspects: This EEG study was done with scalp electrodes positioned according to the 10-20 International system of electrode placement. Electrical activity was reviewed with band pass filter of 1-70Hz , sensitivity of 7 uV/mm, display speed of 47mm/sec with a 60Hz  notched filter applied as appropriate. EEG data were recorded continuously and digitally stored.  Video monitoring was available and reviewed as appropriate. Description: The posterior dominant rhythm consists of 8 Hz activity of moderate voltage (25-35 uV) seen predominantly in posterior head regions, symmetric and reactive to eye opening and eye closing. EEG showed intermittent generalized 3 to 6 Hz theta-delta slowing.  Hyperventilation and photic stimulation were not performed.  Of note, study was technically difficult due to significant  electrode and movement artifact. ABNORMALITY - Intermittent slow, generalized IMPRESSION: This study is suggestive of mild diffuse encephalopathy. No seizures or epileptiform discharges were seen throughout the recording. Charlsie Quest   MR BRAIN WO CONTRAST  Result Date: 06/15/2023 CLINICAL DATA:  Stroke follow-up.  Altered mental status. EXAM: MRI HEAD WITHOUT CONTRAST TECHNIQUE: Multiplanar, multiecho pulse sequences of the brain and surrounding structures were obtained without intravenous contrast. COMPARISON:  CTA of the head neck from yesterday FINDINGS: Truncated and motion degraded study,  multiple attempts were made for diagnostic images. Axial diffusion imaging is degraded but diagnostic and negative for infarct. Major flow voids are preserved on T2 weighted imaging. No evidence of hemorrhage, hydrocephalus, or mass. IMPRESSION: Truncated and motion degraded brain MRI without acute finding including infarct. Electronically Signed   By: Tiburcio Pea M.D.   On: 06/15/2023 09:07     Medical Consultants:   None.   Subjective:    [name] no complaints.  Objective:    Vitals:   06/16/23 1600 06/16/23 1937 06/17/23 0000 06/17/23 0404  BP: (!) 156/67 (!) 177/74 (!) 175/78 (!) 177/62  Pulse: 77 80 78 78  Resp: 16 18 18 18   Temp: 98.5 F (36.9 C) 98 F (36.7 C) 98.4 F (36.9 C) 97.7 F (36.5 C)  TempSrc: Oral Oral Oral Oral  SpO2: 100% 99% 99% 100%  Weight:      Height:       SpO2: 100 %   Intake/Output Summary (Last 24 hours) at 06/17/2023 4098 Last data filed at 06/17/2023 0424 Gross per 24 hour  Intake --  Output 500 ml  Net -500 ml   Filed Weights   06/14/23 1546  Weight: 76 kg    Exam: General exam: In no acute distress. Respiratory system: Good air movement and clear to auscultation. Cardiovascular system: S1 & S2 heard, RRR. No JVD,.  Gastrointestinal system: Abdomen is nondistended, soft and nontender.  Extremities: No pedal edema. Skin: No rashes, lesions or ulcers Psychiatry: No judgment or insight of medical condition.   Data Reviewed:    Labs: Basic Metabolic Panel: Recent Labs  Lab 06/12/23 1433 06/14/23 1630 06/14/23 1637 06/14/23 2129 06/15/23 0625 06/16/23 0500 06/17/23 0402  NA 136 136 136  --  138 139 138  K 3.8 5.5* 5.8*  --  3.4* 3.5 3.6  CL 102 103 108  --  104 105 107  CO2 26 22  --   --  26 25 25   GLUCOSE 118* 125* 126*  --  92 163* 129*  BUN 27* 25* 38*  --  17 12 15   CREATININE 0.99 0.77 0.80  --  0.99 0.76 0.81  CALCIUM 9.4 9.8  --   --  9.5 9.4 9.3  MG  --   --   --   --   --  1.6*  --   PHOS   --   --   --  3.4  --  2.1* 3.6   GFR Estimated Creatinine Clearance: 49 mL/min (by C-G formula based on SCr of 0.81 mg/dL). Liver Function Tests: Recent Labs  Lab 06/12/23 1433 06/14/23 1630 06/16/23 0500 06/17/23 0402  AST 14 28 17   --   ALT 11 16 14   --   ALKPHOS 44 44 44  --   BILITOT 0.3 0.7 0.6  --   PROT 6.7 7.3 6.7  --   ALBUMIN 3.7 3.6 3.2* 3.4*   No results for input(s): "LIPASE", "AMYLASE" in the last  168 hours. No results for input(s): "AMMONIA" in the last 168 hours. Coagulation profile Recent Labs  Lab 06/14/23 1630 06/15/23 0625  INR 1.1 1.2   COVID-19 Labs  No results for input(s): "DDIMER", "FERRITIN", "LDH", "CRP" in the last 72 hours.  Lab Results  Component Value Date   SARSCOV2NAA NEGATIVE 05/17/2023   SARSCOV2NAA NEGATIVE 08/12/2020   SARSCOV2NAA NEGATIVE 06/24/2020    CBC: Recent Labs  Lab 06/12/23 1433 06/14/23 1630 06/14/23 1637 06/15/23 0625 06/16/23 0500  WBC 8.4 7.7  --  6.7 7.8  NEUTROABS 6.0 5.8  --   --  5.5  HGB 11.2* 12.9 13.9 11.7* 12.0  HCT 34.7* 41.7 41.0 35.8* 37.8  MCV 86.4 85.6  --  83.3 82.2  PLT 221.0 191  --  222 236   Cardiac Enzymes: Recent Labs  Lab 06/14/23 2129  CKTOTAL 16*   BNP (last 3 results) No results for input(s): "PROBNP" in the last 8760 hours. CBG: Recent Labs  Lab 06/16/23 0632 06/16/23 1231 06/16/23 1601 06/16/23 2119 06/17/23 0622  GLUCAP 204* 165* 168* 121* 118*   D-Dimer: No results for input(s): "DDIMER" in the last 72 hours. Hgb A1c: No results for input(s): "HGBA1C" in the last 72 hours. Lipid Profile: No results for input(s): "CHOL", "HDL", "LDLCALC", "TRIG", "CHOLHDL", "LDLDIRECT" in the last 72 hours. Thyroid function studies: No results for input(s): "TSH", "T4TOTAL", "T3FREE", "THYROIDAB" in the last 72 hours.  Invalid input(s): "FREET3" Anemia work up: No results for input(s): "VITAMINB12", "FOLATE", "FERRITIN", "TIBC", "IRON", "RETICCTPCT" in the last 72  hours. Sepsis Labs: Recent Labs  Lab 06/12/23 1433 06/14/23 1630 06/14/23 1638 06/15/23 0625 06/16/23 0500  WBC 8.4 7.7  --  6.7 7.8  LATICACIDVEN  --   --  1.7  --   --    Microbiology No results found for this or any previous visit (from the past 240 hour(s)).   Medications:    aspirin  81 mg Oral Daily   carvedilol  12.5 mg Oral BID WC   clopidogrel  75 mg Oral Daily   enalapril  20 mg Oral QHS   enoxaparin (LOVENOX) injection  40 mg Subcutaneous Q24H   insulin aspart  0-5 Units Subcutaneous QHS   insulin aspart  0-9 Units Subcutaneous TID WC   lamoTRIgine  50 mg Oral BID   mometasone-formoterol  2 puff Inhalation BID   pantoprazole  40 mg Oral Daily   potassium chloride  20 mEq Oral BID   QUEtiapine  25 mg Oral QPM   rosuvastatin  40 mg Oral Daily   umeclidinium bromide  1 puff Inhalation Daily   Continuous Infusions:      LOS: 3 days   Marinda Elk  [facility]  06/17/2023, 8:21 AM

## 2023-06-17 NOTE — Plan of Care (Signed)
  Problem: Metabolic: Goal: Ability to maintain appropriate glucose levels will improve Outcome: Progressing   Problem: Clinical Measurements: Goal: Ability to maintain clinical measurements within normal limits will improve Outcome: Progressing Goal: Will remain free from infection Outcome: Progressing Goal: Respiratory complications will improve Outcome: Progressing Goal: Cardiovascular complication will be avoided Outcome: Progressing   Problem: Elimination: Goal: Will not experience complications related to bowel motility Outcome: Progressing Goal: Will not experience complications related to urinary retention Outcome: Progressing   Problem: Pain Management: Goal: General experience of comfort will improve Outcome: Progressing   Problem: Education: Goal: Ability to describe self-care measures that may prevent or decrease complications (Diabetes Survival Skills Education) will improve Outcome: Not Progressing   Problem: Coping: Goal: Ability to adjust to condition or change in health will improve Outcome: Not Progressing   Problem: Education: Goal: Knowledge of General Education information will improve Description: Including pain rating scale, medication(s)/side effects and non-pharmacologic comfort measures Outcome: Not Progressing   Problem: Activity: Goal: Risk for activity intolerance will decrease Outcome: Not Progressing   Problem: Coping: Goal: Level of anxiety will decrease Outcome: Not Progressing   Problem: Safety: Goal: Ability to remain free from injury will improve Outcome: Not Progressing

## 2023-06-17 NOTE — Progress Notes (Signed)
[facility] night cross cover note:  I was notified by RN that this patient is agitated, confused, pulling at their telemetry leads, repeatedly getting of bed, with these behaviors refractory to attempts at verbal redirection and refractory to doses of Haldol 1 mg IV as well as Seroquel 25 mg po.  She has an existing order for Haldol 1 mg IV every 6 hours as needed.  However, in the setting of associated interference with ongoing medical treatment posing potential harm to themself, including increased fall risk, and the refractory nature of her behaviors to Haldol/Seroquel, I have placed an order for Geodon 20 mg IM x 1 dose now.    Newton Pigg, DO Hospitalist

## 2023-06-17 NOTE — Progress Notes (Signed)
The patient has not slept the whole night after  20 mg Ziprasidone IM  and 1 mg IV Haldol. She's fedgity/restless and still trying to climb out of the bed. Will continue to monitor.

## 2023-06-17 NOTE — TOC Initial Note (Signed)
Transition of Care [facility]) - Initial/Assessment Note    Patient Details  Name: Michelle [name] MRN: [id number] Date of Birth: [birth year]  Transition of Care [facility]) CM/SW Contact:    Lawerance Sabal, RN Phone Number: 06/17/2023, 10:51 AM  Clinical Narrative:                  Per CSW, son declined SNF due to copays, requesting HH, spoke w [facility], whom patient was set up with from [place], they are able to accept for home health services. Added to AVS  Spoke w son Michelle [name], has several walkers and rollator, has 3/1, ramp to get in home, shower chair.  Michelle [name] states that he will provide transportation home.   Michelle, [name] (Son) [phone]   Expected Discharge Plan: Home w Home Health Services Barriers to Discharge: Continued Medical Work up   Patient Goals and CMS Choice            Expected Discharge Plan and Services                                     [facility] Agency: [facility] Date [facility] Agency Contacted: 06/17/23 Time HH Agency Contacted: 1050 Representative spoke with at [facility] Agency: Rivka Barbara  Prior Living Arrangements/Services                       Activities of Daily Living   ADL Screening (condition at time of admission) Independently performs ADLs?: No Does the patient have a NEW difficulty with bathing/dressing/toileting/self-feeding that is expected to last >3 days?: No Does the patient have a NEW difficulty with getting in/out of bed, walking, or climbing stairs that is expected to last >3 days?: No Does the patient have a NEW difficulty with communication that is expected to last >3 days?: No Is the patient deaf or have difficulty hearing?: No Does the patient have difficulty seeing, even when wearing glasses/contacts?: No Does the patient have difficulty concentrating, remembering, or making decisions?: Yes  Permission Sought/Granted                  Emotional Assessment              Admission diagnosis:  AMS (altered mental status)  [R41.82] Patient Active Problem List   Diagnosis Date Noted   Overweight 06/16/2023   Hyperkalemia 06/14/2023   Breakthrough seizure (HCC) 05/17/2023   Wheelchair dependence 05/17/2023   DNR (do not resuscitate)/DNI(Do Not Intubate) 05/17/2023   Memory impairment 09/09/2022   Chronic pain of both knees 04/05/2022   Epilepsy (HCC) 03/12/2021   Adnexal mass 11/24/2020   Thickened endometrium 11/24/2020   Dementia (HCC) 10/22/2020   Acute metabolic encephalopathy 08/19/2020   Hypokalemia 08/19/2020   Actinic keratosis 11/17/2017   Anemia, iron deficiency 10/23/2015   CKD (chronic kidney disease), stage III (HCC) 07/25/2014   Osteoarthritis, knee 05/13/2014   Anxiety state 04/21/2014   Chronic diastolic CHF (congestive heart failure) (HCC) 03/13/2014   SOB (shortness of breath) 03/05/2014   CAD- RCA PCI '90s, STEMI-RCA DES 02/18/14 03/05/2014   Back pain, lumbosacral 10/10/2012   Type 2 diabetes mellitus without complication, with long-term current use of insulin (HCC) 03/26/2007   Hyperlipidemia associated with type 2 diabetes mellitus (HCC) 03/26/2007   Essential hypertension 04/21/2006   COPD (chronic obstructive pulmonary disease) (HCC) 04/21/2006   PCP:  Shelva Majestic, MD Pharmacy:   [facility]  [id number] [address], [place] - [address] [place] [address] Phone: [phone] Fax: [phone]  [facility] - [place], [place] - [address] [place] [address] Phone: [phone] Fax: [phone]  [place] [facility] [address] [place] [address] Phone: [phone] Fax: [phone]  [facility] [address] [place] [address] Phone: [phone] Fax: [phone]     Social Determinants of Health (SDOH) Social History: SDOH Screenings   Food Insecurity: No Food Insecurity (06/14/2023)  Housing:  Patient Declined (06/14/2023)  Transportation Needs: No Transportation Needs (06/14/2023)  Utilities: Not At Risk (06/14/2023)  Depression (PHQ2-9): Medium Risk (08/19/2022)  Financial Resource Strain: Low Risk  (06/20/2022)  Physical Activity: Inactive (06/20/2022)  Social Connections: Socially Isolated (06/20/2022)  Stress: No Stress Concern Present (06/20/2022)  Tobacco Use: Medium Risk (06/14/2023)   SDOH Interventions:     Readmission Risk Interventions     No data to display

## 2023-06-17 NOTE — Plan of Care (Signed)
  Problem: Education: Goal: Ability to describe self-care measures that may prevent or decrease complications (Diabetes Survival Skills Education) will improve Outcome: Not Progressing Goal: Individualized Educational Video(s) Outcome: Not Progressing   Problem: Coping: Goal: Ability to adjust to condition or change in health will improve Outcome: Not Progressing   Problem: Fluid Volume: Goal: Ability to maintain a balanced intake and output will improve Outcome: Not Progressing   Problem: Health Behavior/Discharge Planning: Goal: Ability to identify and utilize available resources and services will improve Outcome: Not Progressing Goal: Ability to manage health-related needs will improve Outcome: Not Progressing   Problem: Metabolic: Goal: Ability to maintain appropriate glucose levels will improve Outcome: Not Progressing   Problem: Nutritional: Goal: Maintenance of adequate nutrition will improve Outcome: Not Progressing Goal: Progress toward achieving an optimal weight will improve Outcome: Not Progressing   Problem: Skin Integrity: Goal: Risk for impaired skin integrity will decrease Outcome: Not Progressing   Problem: Tissue Perfusion: Goal: Adequacy of tissue perfusion will improve Outcome: Not Progressing   Problem: Education: Goal: Knowledge of General Education information will improve Description: Including pain rating scale, medication(s)/side effects and non-pharmacologic comfort measures Outcome: Not Progressing   Problem: Health Behavior/Discharge Planning: Goal: Ability to manage health-related needs will improve Outcome: Not Progressing   Problem: Clinical Measurements: Goal: Ability to maintain clinical measurements within normal limits will improve Outcome: Not Progressing Goal: Will remain free from infection Outcome: Not Progressing Goal: Diagnostic test results will improve Outcome: Not Progressing Goal: Respiratory complications will  improve Outcome: Not Progressing Goal: Cardiovascular complication will be avoided Outcome: Not Progressing   Problem: Activity: Goal: Risk for activity intolerance will decrease Outcome: Not Progressing   Problem: Nutrition: Goal: Adequate nutrition will be maintained Outcome: Not Progressing   Problem: Coping: Goal: Level of anxiety will decrease Outcome: Not Progressing   Problem: Elimination: Goal: Will not experience complications related to bowel motility Outcome: Not Progressing Goal: Will not experience complications related to urinary retention Outcome: Not Progressing   Problem: Pain Management: Goal: General experience of comfort will improve Outcome: Not Progressing   Problem: Safety: Goal: Ability to remain free from injury will improve Outcome: Not Progressing   Problem: Skin Integrity: Goal: Risk for impaired skin integrity will decrease Outcome: Not Progressing

## 2023-06-17 NOTE — TOC Progression Note (Addendum)
Spoke to pt's son/Randy re PT recommendation for SNF. Pt with recent stay at [facility]. Per son, pt is in daily copay status for SNF which pt/family are not able to afford. Son states plan is for pt to return home with [facility]. [facility] reportedly arranged HH with [facility] but son states they never started services. CM aware of HH/DME needs. MD updated.   Dellie Burns, MSW, LCSW [phone] (coverage)

## 2023-06-18 DIAGNOSIS — J449 Chronic obstructive pulmonary disease, unspecified: Secondary | ICD-10-CM | POA: Diagnosis not present

## 2023-06-18 DIAGNOSIS — R569 Unspecified convulsions: Secondary | ICD-10-CM | POA: Diagnosis not present

## 2023-06-18 DIAGNOSIS — G9341 Metabolic encephalopathy: Secondary | ICD-10-CM | POA: Diagnosis not present

## 2023-06-18 DIAGNOSIS — R4182 Altered mental status, unspecified: Secondary | ICD-10-CM | POA: Diagnosis not present

## 2023-06-18 LAB — GLUCOSE, CAPILLARY
Glucose-Capillary: 131 mg/dL — ABNORMAL HIGH (ref 70–99)
Glucose-Capillary: 142 mg/dL — ABNORMAL HIGH (ref 70–99)

## 2023-06-18 MED ORDER — LAMOTRIGINE 25 MG PO TABS: 50.0000 mg | ORAL_TABLET | Freq: Two times a day (BID) | ORAL | 1 refills | Status: DC

## 2023-06-18 NOTE — TOC Transition Note (Signed)
Transition of Care [facility]) - CM/SW Discharge Note   Patient Details  Name: Michelle [name] MRN: [id number] Date of Birth: 08-08-1932  Transition of Care [facility]) CM/SW Contact:  Lawerance Sabal, RN Phone Number: 06/18/2023, 9:39 AM   Clinical Narrative:      Notified HH agency that patient will DC today  Son to transport No DME needs    Barriers to Discharge: Continued Medical Work up   Patient Goals and CMS Choice      Discharge Placement                         Discharge Plan and Services Additional resources added to the After Visit Summary for                              [facility] Agency: [facility] Date [facility] Agency Contacted: 06/17/23 Time HH Agency Contacted: 1050 Representative spoke with at [facility] Agency: Rivka Barbara  Social Determinants of Health (SDOH) Interventions SDOH Screenings   Food Insecurity: No Food Insecurity (06/14/2023)  Housing: Patient Declined (06/14/2023)  Transportation Needs: No Transportation Needs (06/14/2023)  Utilities: Not At Risk (06/14/2023)  Depression (PHQ2-9): Medium Risk (08/19/2022)  Financial Resource Strain: Low Risk  (06/20/2022)  Physical Activity: Inactive (06/20/2022)  Social Connections: Socially Isolated (06/20/2022)  Stress: No Stress Concern Present (06/20/2022)  Tobacco Use: Medium Risk (06/14/2023)     Readmission Risk Interventions     No data to display

## 2023-06-18 NOTE — Discharge Summary (Addendum)
Physician Discharge Summary  Michelle [name] NWG:[id number] DOB: Sep 23, 1932 DOA: 06/14/2023  PCP: Shelva Majestic, MD  Admit date: 06/14/2023 Discharge date: 06/18/2023  Admitted From: Home Disposition:  Home  Recommendations for Outpatient Follow-up:  Follow up with PCP in 1-2 weeks Please obtain BMP/CBC in one week   Home Health:No Equipment/Devices:None  Discharge Condition:Stable CODE STATUS:DNR/DNI Diet recommendation: Heart Healthy   Brief/Interim Summary:  89 y.o. female past medical history of dementia and disorder wheelchair-bound most of the history was obtained from the chart and son went to the bathroom when getting up from the bathroom starting having grimacing face right word gaze for about 30 seconds then became unresponsive to verbal stimuli no loss of consciousness, no incontinence or biting of the tongue she eventually became unresponsive and lethargic.  As there was a concern of seizure activity she was given Ativan and Keppra in the ED neurology was consulted.  Discharge Diagnoses:  Active Problems:   Type 2 diabetes mellitus without complication, with long-term current use of insulin (HCC)   Essential hypertension   COPD (chronic obstructive pulmonary disease) (HCC)   Acute metabolic encephalopathy   Hypokalemia   Hyperkalemia   Overweight  Acute metabolic encephalopathy possibly due to seizures: Initially there was a concern for seizures, CT of the head was unremarkable MRI showed no acute CVA. CT angio of the head and neck showed no large vessel occlusion. EEG showed diffuse encephalopathy with no seizure appreciated. UDS was negative. Neurology was consulted recommended starting rectal twice a day which she will continue as an outpatient. Patient was recently started on nortriptyline as an outpatient, which lowers her threshold for seizures this has been discontinued. PT OT evaluated the patient, but the patient cannot go to skilled nursing facility  as she was discharged recently from skilled nursing facility. The patient will go home with home health PT.  Hypomagnesemia/hypophosphatemia: Repleted now improved.  Diabetes mellitus type 2 without complications: She will resume her metformin at home, hemoglobin A1c of 6.4.  Essential hypertension: She will go home on Coreg, pro, Lasix and low-dose Aldactone. Goal blood pressures less than 160/90.  COPD: No changes made continue inhalers.  Hyperlipidemia: She is no longer taking fenofibrate.  CAD: Continue aspirin, Vasotec, Plavix and beta-blockers.  Acute confusional state/sundowning: She will continue Seroquel at bedtime.  Stage II cubitus ulcer present on admission: Noted.  Discharge Instructions  Discharge Instructions     Diet - low sodium heart healthy   Complete by: As directed    Discharge wound care:   Complete by: As directed    Per wound care instructions   Increase activity slowly   Complete by: As directed       Allergies as of 06/18/2023       Reactions   Codeine Sulfate Nausea Only   Morphine Sulfate Nausea Only        Medication List     STOP taking these medications    Accu-Chek FastClix Lancet Kit   blood glucose meter kit and supplies Kit   calcium carbonate 1500 (600 Ca) MG Tabs tablet Commonly known as: OSCAL   cyanocobalamin 1000 MCG tablet Commonly known as: VITAMIN B12   diclofenac Sodium 1 % Gel Commonly known as: VOLTAREN   folic acid 400 MCG tablet Commonly known as: FOLVITE   metFORMIN 500 MG 24 hr tablet Commonly known as: GLUCOPHAGE-XR   nortriptyline 10 MG capsule Commonly known as: PAMELOR   psyllium 58.6 % packet Commonly known as: METAMUCIL  True Metrix Blood Glucose Test test strip Generic drug: glucose blood   Valtoco 15 MG Dose 7.5 MG/0.1ML Lqpk Generic drug: diazePAM (15 MG Dose)   vitamin E 180 MG (400 UNITS) capsule       TAKE these medications    acetaminophen 650 MG CR  tablet Commonly known as: TYLENOL Take 1,300 mg by mouth 3 (three) times daily.   albuterol 108 (90 Base) MCG/ACT inhaler Commonly known as: VENTOLIN HFA INHALE 1 PUFF EVERY 4 HOURS AS NEEDED FOR WHEEZING, SHORTNESS OF BREATH (RESCUE INHALER IF ADVAIR NOT WORKING) What changed:  See the new instructions. Another medication with the same name was removed. Continue taking this medication, and follow the directions you see here.   amLODipine 2.5 MG tablet Commonly known as: NORVASC TAKE 1 TABLET EVERY MORNING. IF AFTERNOON BLOOD PRESSURE IS OVER 145/90 TAKE SECOND TABLET.   aspirin 81 MG chewable tablet Chew 81 mg by mouth daily.   carvedilol 12.5 MG tablet Commonly known as: COREG Take 1 tablet (12.5 mg total) by mouth 2 (two) times daily.   cholecalciferol 25 MCG (1000 UNIT) tablet Commonly known as: VITAMIN D3 Take 1,000 Units by mouth daily with breakfast.   clopidogrel 75 MG tablet Commonly known as: PLAVIX TAKE 1 TABLET EVERY DAY   enalapril 20 MG tablet Commonly known as: VASOTEC Take 1 tablet (20 mg total) by mouth at bedtime. What changed: when to take this   fenofibrate micronized 134 MG capsule Commonly known as: LOFIBRA TAKE 1 CAPSULE EVERY DAY   ferrous sulfate 325 (65 FE) MG tablet Twice a week What changed:  how much to take how to take this when to take this additional instructions   fluticasone-salmeterol 250-50 MCG/ACT Aepb Commonly known as: ADVAIR INHALE 1 PUFF TWICE DAILY   furosemide 20 MG tablet Commonly known as: LASIX Take 1 tablet (20 mg total) by mouth as needed for fluid or edema.   hydrALAZINE 25 MG tablet Commonly known as: APRESOLINE Take 25 mg by mouth 2 (two) times daily. Only use is blood pressure get over >160   icosapent Ethyl 1 g capsule Commonly known as: Vascepa Take 2 capsules (2 g total) by mouth 2 (two) times daily. What changed:  how much to take when to take this additional instructions   lamoTRIgine 25 MG  tablet Commonly known as: LAMICTAL Take 1 tablet (25 mg total) by mouth in the morning AND 2 tablets (50 mg total) at bedtime. What changed: See the new instructions.   loperamide 2 MG tablet Commonly known as: IMODIUM A-D Take 2-4 mg by mouth 2 (two) times daily as needed for diarrhea or loose stools.   loratadine 10 MG tablet Commonly known as: CLARITIN Take 10 mg by mouth every other day.   medroxyPROGESTERone 10 MG tablet Commonly known as: Provera Take 1 tablet (10 mg total) by mouth daily. What changed: when to take this   nitroGLYCERIN 0.4 MG SL tablet Commonly known as: NITROSTAT DISSOLVE 1 TAB UNDER TONGUE FOR CHEST PAIN - IF PAIN REMAINS AFTER 5 MIN, CALL 911 AND REPEAT DOSE. MAX 3 TABS IN 15 MINUTES What changed:  how much to take how to take this when to take this reasons to take this   pantoprazole 40 MG tablet Commonly known as: PROTONIX TAKE 1 TABLET EVERY DAY   QUEtiapine 25 MG tablet Commonly known as: SEROQUEL Take 1 tablet (25 mg total) by mouth every evening. Between 1830 and 1900   rosuvastatin 40 MG tablet  Commonly known as: CRESTOR Take 1 tablet (40 mg total) by mouth daily.   sertraline 50 MG tablet Commonly known as: ZOLOFT TAKE 2 TABLETS AT BEDTIME   Spiriva HandiHaler 18 MCG inhalation capsule Generic drug: tiotropium PLACE 1 CAPSULE INTO HANDIHALER AND INHALE THE CONTENTS DAILY What changed: See the new instructions.   TRUEplus Lancets 33G Misc TEST BLOOD SUGAR UP TO FOUR TIMES DAILY AS DIRECTED               Discharge Care Instructions  (From admission, onward)           Start     Ordered   06/18/23 0000  Discharge wound care:       Comments: Per wound care instructions   06/18/23 0930            Follow-up Information     [facility] Follow up.   Specialty: Home Health Services Why: for home health services after DC Contact information: [address] [place]  [address] [phone]                Allergies  Allergen Reactions   Codeine Sulfate Nausea Only   Morphine Sulfate Nausea Only    Consultations: Neurology   Procedures/Studies: EEG adult  Result Date: July 13, 2023 Charlsie Quest, MD     07-13-23 10:37 AM Patient Name: KRISTIE ZESIGER MRN: [id number] Epilepsy Attending: Charlsie Quest Referring Physician/Provider: Marjorie Smolder, NP Date: 07/13/2023 Duration: 23.55 mins Patient history: 89yo F with history of recent admission for breakthrough seizures with prior history of seizures, diabetes, COPD, modified Rankin score of 4 at baseline, presenting with concern for depressed mental status after a staring spell this morning concerning for seizure. EEG to evaluate for seizure Level of alertness: Awake AEDs during EEG study: LTG., Ativan Technical aspects: This EEG study was done with scalp electrodes positioned according to the 10-20 International system of electrode placement. Electrical activity was reviewed with band pass filter of 1-70Hz , sensitivity of 7 uV/mm, display speed of 42mm/sec with a 60Hz  notched filter applied as appropriate. EEG data were recorded continuously and digitally stored.  Video monitoring was available and reviewed as appropriate. Description: The posterior dominant rhythm consists of 8 Hz activity of moderate voltage (25-35 uV) seen predominantly in posterior head regions, symmetric and reactive to eye opening and eye closing. EEG showed intermittent generalized 3 to 6 Hz theta-delta slowing.  Hyperventilation and photic stimulation were not performed.  Of note, study was technically difficult due to significant electrode and movement artifact. ABNORMALITY - Intermittent slow, generalized IMPRESSION: This study is suggestive of mild diffuse encephalopathy. No seizures or epileptiform discharges were seen throughout the recording. Charlsie Quest   MR BRAIN WO CONTRAST  Result Date:  July 13, 2023 CLINICAL DATA:  Stroke follow-up.  Altered mental status. EXAM: MRI HEAD WITHOUT CONTRAST TECHNIQUE: Multiplanar, multiecho pulse sequences of the brain and surrounding structures were obtained without intravenous contrast. COMPARISON:  CTA of the head neck from yesterday FINDINGS: Truncated and motion degraded study, multiple attempts were made for diagnostic images. Axial diffusion imaging is degraded but diagnostic and negative for infarct. Major flow voids are preserved on T2 weighted imaging. No evidence of hemorrhage, hydrocephalus, or mass. IMPRESSION: Truncated and motion degraded brain MRI without acute finding including infarct. Electronically Signed   By: Tiburcio Pea M.D.   On: July 13, 2023 09:07   DG CHEST PORT 1 VIEW  Result Date: 06/14/2023 CLINICAL  DATA:  Cough and altered mental status. EXAM: PORTABLE CHEST 1 VIEW COMPARISON:  May 17, 2023 FINDINGS: The heart size and mediastinal contours are within normal limits. There is marked severity calcification of the thoracic aorta. Mild, diffuse, chronic appearing increased lung markings are seen with mild atelectatic changes suspected within the left lung base. No pleural effusion or pneumothorax is identified. Multilevel degenerative changes seen throughout the thoracic spine. IMPRESSION: Chronic appearing increased lung markings with mild left basilar atelectasis. Electronically Signed   By: Aram Candela M.D.   On: 06/14/2023 18:36   CT ANGIO HEAD NECK W WO CM W PERF (CODE STROKE)  Result Date: 06/14/2023 CLINICAL DATA:  Altered mental status. Seizure at 8 o'clock a.m. Dementia. EXAM: CT ANGIOGRAPHY HEAD AND NECK CT PERFUSION BRAIN TECHNIQUE: Multidetector CT imaging of the head and neck was performed using the standard protocol during bolus administration of intravenous contrast. Multiplanar CT image reconstructions and MIPs were obtained to evaluate the vascular anatomy. Carotid stenosis measurements (when applicable)  are obtained utilizing NASCET criteria, using the distal internal carotid diameter as the denominator. Multiphase CT imaging of the brain was performed following IV bolus contrast injection. Subsequent parametric perfusion maps were calculated using RAPID software. RADIATION DOSE REDUCTION: This exam was performed according to the departmental dose-optimization program which includes automated exposure control, adjustment of the mA and/or kV according to patient size and/or use of iterative reconstruction technique. CONTRAST:  OMNIPAQUE IOHEXOL 350 MG/ML SOLN COMPARISON:  CT head without contrast 05/17/2023 MR head without contrast 06/25/2020. FINDINGS: CTA NECK FINDINGS Aortic arch: Study is moderately degraded by patient motion. Atherosclerotic changes are present at the aortic arch and great vessel origins. A 50% stenosis at the origin of the left subclavian is suspected. No other definite stenosis are present. Right carotid system: The right common carotid artery is within normal limits. Bifurcation demonstrates some atherosclerotic change without focal stenosis. Mild tortuosity is present cervical right ICA without significant stenosis of greater than 50%. Left carotid system: The left common carotid artery is within normal limits. More pronounced atherosclerotic calcifications are present bifurcation proximal left ICA without significant stenosis. Mild tortuosity is present in the cervical left ICA without significant stenosis. Vertebral arteries: The vertebral arteries are not well seen in the V1 or V2 segments due to patient motion. The more distal cervical vertebral arteries are within normal limits bilaterally. Skeleton: Multilevel degenerative changes are present cervical spine. Ankylosis with grade 1 anterolisthesis is present at C6-7. No focal osseous lesions are present. Other neck: Soft tissues the neck are otherwise unremarkable. Salivary glands are within normal limits. Thyroid is normal. No  significant adenopathy is present. No focal mucosal or submucosal lesions are present. Upper chest: The lung apices are clear. The thoracic inlet is within normal limits. Review of the MIP images confirms the above findings CTA HEAD FINDINGS Anterior circulation: Atherosclerotic calcifications are present within the cavernous internal carotid arteries bilaterally without significant stenosis through the ICA termini. The right A1 segment is aplastic. Both A2 segments fill from the left. M1 segments are normal bilaterally. The MCA bifurcations are normal. The ACA and MCA branch vessels are within normal limits. No aneurysm is present. Posterior circulation: PICA origins are visualized and normal. The vertebrobasilar junction basilar artery normal. The superior cerebellar arteries are patent. Both posterior cerebral arteries originate from basilar tip. The PCA branch vessels are normal bilaterally. No aneurysm is present. Venous sinuses: The dural sinuses are patent. The straight sinus and deep cerebral veins are  intact. Cortical veins are within normal limits. No significant vascular malformation is evident. Anatomic variants: None Review of the MIP images confirms the above findings CT Brain Perfusion Findings: ASPECTS: 10/10 CBF (<30%) Volume: 0mL Perfusion (Tmax>6.0s) volume: 0mL IMPRESSION: 1. No emergent large vessel occlusion. 2. Atherosclerotic changes at the carotid bifurcations and cavernous internal carotid arteries bilaterally without significant stenosis. 3. The vertebral arteries are not well seen in the V1 or V2 segments due to patient motion. The more distal cervical vertebral arteries are within normal limits bilaterally. 4. A 50% stenosis at the origin of the left subclavian is suspected. 5. Multilevel degenerative changes of the cervical spine. 6. Ankylosis with grade 1 anterolisthesis at C6-7. 7. Normal CT perfusion. 8.  Aortic Atherosclerosis (ICD10-I70.0). Electronically Signed   By: Marin Roberts M.D.   On: 06/14/2023 17:27   CT HEAD CODE STROKE WO CONTRAST  Result Date: 06/14/2023 CLINICAL DATA:  Code stroke. Right-sided gaze. Left-sided weakness. EXAM: CT HEAD WITHOUT CONTRAST TECHNIQUE: Contiguous axial images were obtained from the base of the skull through the vertex without intravenous contrast. RADIATION DOSE REDUCTION: This exam was performed according to the departmental dose-optimization program which includes automated exposure control, adjustment of the mA and/or kV according to patient size and/or use of iterative reconstruction technique. COMPARISON:  CT head without contrast 05/17/2023 FINDINGS: Brain: Mild generalized atrophy and white matter disease is stable. No acute infarct, hemorrhage, or mass lesion is present. The ventricles are proportionate to the degree of atrophy. Deep brain nuclei are within normal limits. Insular cortex is within normal limits bilaterally. The brainstem and cerebellum are within normal limits. Midline structures are within normal limits. Vascular: Atherosclerotic calcifications are present within the cavernous internal carotid arteries and at the dural margin left vertebral artery. No hyperdense vessel is present. Skull: Calvarium is intact. No focal lytic or blastic lesions are present. No significant extracranial soft tissue lesion is present. Sinuses/Orbits: The paranasal sinuses and mastoid air cells are clear. Bilateral lens replacements are noted. Globes and orbits are otherwise unremarkable. ASPECTS [facility] Stroke Program Early CT Score) - Ganglionic level infarction (caudate, lentiform nuclei, internal capsule, insula, M1-M3 cortex): 7/7 - Supraganglionic infarction (M4-M6 cortex): 3/3 Total score (0-10 with 10 being normal): 10/10 IMPRESSION: 1. No acute intracranial abnormality or significant interval change. 2. Stable atrophy and white matter disease. This likely reflects the sequela of chronic microvascular ischemia. 3. Aspects is 10/10.  The above was relayed via text pager to Dr. Marthe Patch on 06/14/2023 at 16:49 . Electronically Signed   By: Marin Roberts M.D.   On: 06/14/2023 16:49   ECHOCARDIOGRAM LIMITED  Result Date: 05/22/2023    ECHOCARDIOGRAM LIMITED REPORT   Patient Name:   Joesphine A Leisner Date of Exam: 05/22/2023 Medical Rec #:  [id number]       Height:       65.0 in Accession #:    [id number]      Weight:       168.0 lb Date of Birth:  May 28, 1933       BSA:          1.837 m Patient Age:    89 years        BP:           168/56 mmHg Patient Gender: F               HR:           70 bpm. Exam Location:  Inpatient Procedure: Limited Echo  Indications:    Chest Pain R07.9  History:        Patient has prior history of Echocardiogram examinations, most                 recent 02/17/2023. CAD, COPD; Risk Factors:Hypertension and                 Diabetes.  Sonographer:    Darlys Gales Referring Phys: [phone] STEPHEN K CHIU IMPRESSIONS  1. Left ventricular ejection fraction, by estimation, is 60 to 65%. The left ventricle has normal function. There is mild concentric left ventricular hypertrophy.  2. Right ventricular systolic function is normal.  3. Left atrial size was moderately dilated.  4. The mitral valve is degenerative.  5. The aortic valve is tricuspid. There is mild calcification of the aortic valve.  6. The inferior vena cava is normal in size with greater than 50% respiratory variability, suggesting right atrial pressure of 3 mmHg. Conclusion(s)/Recommendation(s): Limited echo to reassess EF. EF now normal 60-65%. FINDINGS  Left Ventricle: Left ventricular ejection fraction, by estimation, is 60 to 65%. The left ventricle has normal function. There is mild concentric left ventricular hypertrophy. Right Ventricle: Right ventricular systolic function is normal. Left Atrium: Left atrial size was moderately dilated. Pericardium: There is no evidence of pericardial effusion. Mitral Valve: The mitral valve is degenerative in appearance. Mild  to moderate mitral annular calcification. Tricuspid Valve: The tricuspid valve is grossly normal. Aortic Valve: The aortic valve is tricuspid. There is mild calcification of the aortic valve. Venous: The inferior vena cava is normal in size with greater than 50% respiratory variability, suggesting right atrial pressure of 3 mmHg. LEFT VENTRICLE PLAX 2D LVOT diam:     1.80 cm LVOT Area:     2.54 cm  LEFT ATRIUM           Index        RIGHT ATRIUM           Index LA Vol (A4C): 50.3 ml 27.38 ml/m  RA Area:     14.60 cm                                    RA Volume:   31.90 ml  17.37 ml/m   SHUNTS Systemic Diam: 1.80 cm Arvilla Meres MD Electronically signed by Arvilla Meres MD Signature Date/Time: 05/22/2023/9:40:12 AM    Final    (Echo, Carotid, EGD, Colonoscopy, ERCP)    Subjective: No new complaints  Discharge Exam: Vitals:   06/18/23 0800 06/18/23 0811  BP: (!) 142/56   Pulse: (!) 59 62  Resp: 17   Temp: 98.8 F (37.1 C)   SpO2: 98%    Vitals:   06/18/23 0342 06/18/23 0419 06/18/23 0800 06/18/23 0811  BP: (!) 143/60 (!) 141/55 (!) 142/56   Pulse: 69 70 (!) 59 62  Resp: 18 18 17    Temp: 98.1 F (36.7 C) 99 F (37.2 C) 98.8 F (37.1 C)   TempSrc: Oral Oral Axillary   SpO2: 97% 96% 98%   Weight:      Height:        General: Pt is alert, awake, not in acute distress Cardiovascular: RRR, S1/S2 +, no rubs, no gallops Respiratory: CTA bilaterally, no wheezing, no rhonchi Abdominal: Soft, NT, ND, bowel sounds + Extremities: no edema, no cyanosis    The results of significant diagnostics from this hospitalization (including imaging,  microbiology, ancillary and laboratory) are listed below for reference.     Microbiology: No results found for this or any previous visit (from the past 240 hour(s)).   Labs: BNP (last 3 results) Recent Labs    02/16/23 1045 02/18/23 0212  BNP 1,115.4* 670.9*   Basic Metabolic Panel: Recent Labs  Lab 06/12/23 1433 06/14/23 1630  06/14/23 1637 06/14/23 2129 06/15/23 0625 06/16/23 0500 06/17/23 0402  NA 136 136 136  --  138 139 138  K 3.8 5.5* 5.8*  --  3.4* 3.5 3.6  CL 102 103 108  --  104 105 107  CO2 26 22  --   --  26 25 25   GLUCOSE 118* 125* 126*  --  92 163* 129*  BUN 27* 25* 38*  --  17 12 15   CREATININE 0.99 0.77 0.80  --  0.99 0.76 0.81  CALCIUM 9.4 9.8  --   --  9.5 9.4 9.3  MG  --   --   --   --   --  1.6*  --   PHOS  --   --   --  3.4  --  2.1* 3.6   Liver Function Tests: Recent Labs  Lab 06/12/23 1433 06/14/23 1630 06/16/23 0500 06/17/23 0402  AST 14 28 17   --   ALT 11 16 14   --   ALKPHOS 44 44 44  --   BILITOT 0.3 0.7 0.6  --   PROT 6.7 7.3 6.7  --   ALBUMIN 3.7 3.6 3.2* 3.4*   No results for input(s): "LIPASE", "AMYLASE" in the last 168 hours. No results for input(s): "AMMONIA" in the last 168 hours. CBC: Recent Labs  Lab 06/12/23 1433 06/14/23 1630 06/14/23 1637 06/15/23 0625 06/16/23 0500  WBC 8.4 7.7  --  6.7 7.8  NEUTROABS 6.0 5.8  --   --  5.5  HGB 11.2* 12.9 13.9 11.7* 12.0  HCT 34.7* 41.7 41.0 35.8* 37.8  MCV 86.4 85.6  --  83.3 82.2  PLT 221.0 191  --  222 236   Cardiac Enzymes: Recent Labs  Lab 06/14/23 2129  CKTOTAL 16*   BNP: Invalid input(s): "POCBNP" CBG: Recent Labs  Lab 06/17/23 0622 06/17/23 1205 06/17/23 1725 06/17/23 2110 06/18/23 0610  GLUCAP 118* 224* 164* 100* 131*   D-Dimer No results for input(s): "DDIMER" in the last 72 hours. Hgb A1c No results for input(s): "HGBA1C" in the last 72 hours. Lipid Profile No results for input(s): "CHOL", "HDL", "LDLCALC", "TRIG", "CHOLHDL", "LDLDIRECT" in the last 72 hours. Thyroid function studies No results for input(s): "TSH", "T4TOTAL", "T3FREE", "THYROIDAB" in the last 72 hours.  Invalid input(s): "FREET3" Anemia work up No results for input(s): "VITAMINB12", "FOLATE", "FERRITIN", "TIBC", "IRON", "RETICCTPCT" in the last 72 hours. Urinalysis    Component Value Date/Time   COLORURINE  YELLOW 06/14/2023 1545   APPEARANCEUR HAZY (A) 06/14/2023 1545   LABSPEC 1.017 06/14/2023 1545   PHURINE 5.0 06/14/2023 1545   GLUCOSEU NEGATIVE 06/14/2023 1545   HGBUR NEGATIVE 06/14/2023 1545   BILIRUBINUR NEGATIVE 06/14/2023 1545   BILIRUBINUR neg 01/14/2021 1535   KETONESUR NEGATIVE 06/14/2023 1545   PROTEINUR 30 (A) 06/14/2023 1545   UROBILINOGEN 0.2 01/14/2021 1535   NITRITE NEGATIVE 06/14/2023 1545   LEUKOCYTESUR NEGATIVE 06/14/2023 1545   Sepsis Labs Recent Labs  Lab 06/12/23 1433 06/14/23 1630 06/15/23 0625 06/16/23 0500  WBC 8.4 7.7 6.7 7.8   Microbiology No results found for this or any previous visit (from the  past 240 hour(s)).   Time coordinating discharge: Over 35 minutes  SIGNED:   Marinda Elk, MD  [facility] 06/18/2023, 9:31 AM Pager   If 7PM-7AM, please contact night-coverage [email or web address] Password [email or web address]

## 2023-06-18 NOTE — Plan of Care (Signed)
  Problem: Metabolic: Goal: Ability to maintain appropriate glucose levels will improve Outcome: Progressing   Problem: Clinical Measurements: Goal: Ability to maintain clinical measurements within normal limits will improve Outcome: Progressing Goal: Will remain free from infection Outcome: Progressing Goal: Respiratory complications will improve Outcome: Progressing Goal: Cardiovascular complication will be avoided Outcome: Progressing   Problem: Coping: Goal: Ability to adjust to condition or change in health will improve Outcome: Not Progressing   Problem: Health Behavior/Discharge Planning: Goal: Ability to manage health-related needs will improve Outcome: Not Progressing   Problem: Education: Goal: Knowledge of General Education information will improve Description: Including pain rating scale, medication(s)/side effects and non-pharmacologic comfort measures Outcome: Not Progressing

## 2023-06-19 ENCOUNTER — Ambulatory Visit: Payer: Medicare HMO | Admitting: Family Medicine

## 2023-06-19 NOTE — Telephone Encounter (Signed)
Pt recently admitted to the hospital for altered mental status.

## 2023-06-20 ENCOUNTER — Telehealth: Payer: Self-pay | Admitting: Family Medicine

## 2023-06-20 ENCOUNTER — Encounter: Payer: Self-pay | Admitting: Family Medicine

## 2023-06-20 NOTE — Telephone Encounter (Signed)
[name] called stating he was informed that [facility] medicare needs prior authorization from PCP in order to get [facility] home health services. States he is trying to get this done ASAP considering pt needs this.

## 2023-06-21 MED ORDER — QUETIAPINE FUMARATE 25 MG PO TABS: 25.0000 mg | ORAL_TABLET | Freq: Every evening | ORAL | 0 refills | Status: DC

## 2023-06-21 MED ORDER — QUETIAPINE FUMARATE 25 MG PO TABS: 25.0000 mg | ORAL_TABLET | Freq: Every evening | ORAL | 3 refills | Status: DC

## 2023-06-21 NOTE — Telephone Encounter (Signed)
We can certainly try to do a hospital follow-up-I would think since I have seen her within the month that should be able to serve for a face-to-face visit-you can give verbal orders for what ever is needed or have them send paperwork and I will sign off

## 2023-06-21 NOTE — Telephone Encounter (Signed)
Pt son [name] stated that Rolene Arbour is going to run prior [place].  Patient was discharged from hospital on 06/18/23.  Do you want to follow up with her?

## 2023-06-21 NOTE — Telephone Encounter (Signed)
Pt son [name] notified he will hold on visit for now.

## 2023-06-22 DIAGNOSIS — G40909 Epilepsy, unspecified, not intractable, without status epilepticus: Secondary | ICD-10-CM | POA: Diagnosis not present

## 2023-06-22 DIAGNOSIS — I130 Hypertensive heart and chronic kidney disease with heart failure and stage 1 through stage 4 chronic kidney disease, or unspecified chronic kidney disease: Secondary | ICD-10-CM | POA: Diagnosis not present

## 2023-06-22 DIAGNOSIS — F411 Generalized anxiety disorder: Secondary | ICD-10-CM | POA: Diagnosis not present

## 2023-06-22 DIAGNOSIS — I5032 Chronic diastolic (congestive) heart failure: Secondary | ICD-10-CM | POA: Diagnosis not present

## 2023-06-22 DIAGNOSIS — N183 Chronic kidney disease, stage 3 unspecified: Secondary | ICD-10-CM | POA: Diagnosis not present

## 2023-06-22 DIAGNOSIS — G309 Alzheimer's disease, unspecified: Secondary | ICD-10-CM | POA: Diagnosis not present

## 2023-06-22 DIAGNOSIS — E1122 Type 2 diabetes mellitus with diabetic chronic kidney disease: Secondary | ICD-10-CM | POA: Diagnosis not present

## 2023-06-22 DIAGNOSIS — F0284 Dementia in other diseases classified elsewhere, unspecified severity, with anxiety: Secondary | ICD-10-CM | POA: Diagnosis not present

## 2023-06-22 DIAGNOSIS — G9341 Metabolic encephalopathy: Secondary | ICD-10-CM | POA: Diagnosis not present

## 2023-06-23 ENCOUNTER — Ambulatory Visit (HOSPITAL_BASED_OUTPATIENT_CLINIC_OR_DEPARTMENT_OTHER): Payer: Medicare HMO | Admitting: Cardiovascular Disease

## 2023-06-26 ENCOUNTER — Ambulatory Visit (INDEPENDENT_AMBULATORY_CARE_PROVIDER_SITE_OTHER): Payer: Medicare HMO

## 2023-06-26 VITALS — Wt 167.0 lb

## 2023-06-26 DIAGNOSIS — Z0000 Encounter for general adult medical examination without abnormal findings: Secondary | ICD-10-CM | POA: Diagnosis not present

## 2023-06-26 NOTE — Patient Instructions (Addendum)
Michelle [name] , Thank you for taking time to come for your Medicare Wellness Visit. I appreciate your ongoing commitment to your health goals. Please review the following plan we discussed and let me know if I can assist you in the future.   Referrals/Orders/Follow-Ups/Clinician Recommendations: Each day, aim for 6 glasses of water, plenty of protein in your diet and try to get up and walk/ stretch every hour for 5-10 minutes at a time.    This is a list of the screening recommended for you and due dates:  Health Maintenance  Topic Date Due   Zoster (Shingles) Vaccine (1 of 2) 12/30/1951   COVID-19 Vaccine (5 - 2023-24 season) 06/28/2023*   DEXA scan (bone density measurement)  04/21/2043*   Eye exam for diabetics  10/04/2023   Hemoglobin A1C  12/10/2023   Complete foot exam   12/19/2023   Medicare Annual Wellness Visit  06/25/2024   DTaP/Tdap/Td vaccine (3 - Td or Tdap) 02/05/2033   Pneumonia Vaccine  Completed   Flu Shot  Completed   HPV Vaccine  Aged Out  *Topic was postponed. The date shown is not the original due date.    Advanced directives: (In Chart) A copy of your advanced directives are scanned into your chart should your provider ever need it.  Next Medicare Annual Wellness Visit scheduled for next year: Yes

## 2023-06-26 NOTE — Progress Notes (Signed)
Subjective:   Michelle [name] is a 89 y.o. female who presents for Medicare Annual (Subsequent) preventive examination.  Visit Complete: Virtual I connected with  [name] on 06/26/23 by a audio enabled telemedicine application and verified that I am speaking with the correct person using two identifiers. Along with Theodora Blow  Patient Location: Home  Provider Location: Office/Clinic  I discussed the limitations of evaluation and management by telemedicine. The patient expressed understanding and agreed to proceed.  Vital Signs: Because this visit was a virtual/telehealth visit, some criteria may be missing or patient reported. Any vitals not documented were not able to be obtained and vitals that have been documented are patient reported.  Cardiac Risk Factors include: advanced age (>62men, >78 women);dyslipidemia;diabetes mellitus;hypertension     Objective:    Today's Vitals   06/26/23 1423  Weight: 167 lb (75.8 kg)   Body mass index is 27.79 kg/m.     06/26/2023    2:31 PM 06/14/2023    9:32 PM 06/14/2023    3:47 PM 05/17/2023   12:00 PM 05/17/2023    9:14 AM 02/07/2023    3:29 PM 01/16/2023   10:45 AM  Advanced Directives  Does Patient Have a Medical Advance Directive? Yes No No Yes Unable to assess, patient is non-responsive or altered mental status No No  Type of Advance Directive Healthcare Power of [place];Living will Healthcare Power of [place];Out of facility DNR (pink MOST or yellow form);Living will  Living will;Healthcare Power of Attorney     Does patient want to make changes to medical advance directive? No - Patient declined No - Patient declined  No - Patient declined     Copy of Healthcare Power of Attorney in Chart? Yes - validated most recent copy scanned in chart (See row information) Yes - validated most recent copy scanned in chart (See row information)       Would patient like information on creating a medical advance directive? No - Patient  declined No - Patient declined    No - Patient declined   Pre-existing out of facility DNR order (yellow form or pink MOST form)  Yellow form placed in chart (order not valid for inpatient use)         Current Medications (verified) Outpatient Encounter Medications as of 06/26/2023  Medication Sig   acetaminophen (TYLENOL) 650 MG CR tablet Take 1,300 mg by mouth 3 (three) times daily.   albuterol (VENTOLIN HFA) 108 (90 Base) MCG/ACT inhaler INHALE 1 PUFF EVERY 4 HOURS AS NEEDED FOR WHEEZING, SHORTNESS OF BREATH (RESCUE INHALER IF ADVAIR NOT WORKING) (Patient taking differently: Inhale 2 puffs into the lungs as needed.)   amLODipine (NORVASC) 2.5 MG tablet TAKE 1 TABLET EVERY MORNING. IF AFTERNOON BLOOD PRESSURE IS OVER 145/90 TAKE SECOND TABLET.   aspirin 81 MG chewable tablet Chew 81 mg by mouth daily.   carvedilol (COREG) 12.5 MG tablet Take 1 tablet (12.5 mg total) by mouth 2 (two) times daily.   cholecalciferol (VITAMIN D3) 25 MCG (1000 UNIT) tablet Take 1,000 Units by mouth daily with breakfast.   clopidogrel (PLAVIX) 75 MG tablet TAKE 1 TABLET EVERY DAY (Patient taking differently: Take 75 mg by mouth daily.)   enalapril (VASOTEC) 20 MG tablet Take 1 tablet (20 mg total) by mouth at bedtime. (Patient taking differently: Take 20 mg by mouth at bedtime.)   fenofibrate micronized (LOFIBRA) 134 MG capsule TAKE 1 CAPSULE EVERY DAY   ferrous sulfate 325 (65 FE) MG tablet Twice  a week (Patient taking differently: Take 325 mg by mouth 2 (two) times a week. Tuesday  and Friday)   fluticasone-salmeterol (ADVAIR) 250-50 MCG/ACT AEPB INHALE 1 PUFF TWICE DAILY   furosemide (LASIX) 20 MG tablet Take 1 tablet (20 mg total) by mouth as needed for fluid or edema.   icosapent Ethyl (VASCEPA) 1 g capsule Take 2 capsules (2 g total) by mouth 2 (two) times daily. (Patient taking differently: Take 1-2 g by mouth See admin instructions. Take 1 gram in the morning and afternoon. Take 2 g at bedtime)   lamoTRIgine  (LAMICTAL) 25 MG tablet Take 2 tablets (50 mg total) by mouth 2 (two) times daily.   loperamide (IMODIUM A-D) 2 MG tablet Take 2-4 mg by mouth 2 (two) times daily as needed for diarrhea or loose stools.   loratadine (CLARITIN) 10 MG tablet Take 10 mg by mouth every other day.   medroxyPROGESTERone (PROVERA) 10 MG tablet Take 1 tablet (10 mg total) by mouth daily. (Patient taking differently: Take 10 mg by mouth at bedtime.)   nitroGLYCERIN (NITROSTAT) 0.4 MG SL tablet DISSOLVE 1 TAB UNDER TONGUE FOR CHEST PAIN - IF PAIN REMAINS AFTER 5 MIN, CALL 911 AND REPEAT DOSE. MAX 3 TABS IN 15 MINUTES (Patient taking differently: Place 0.4 mg under the tongue every 5 (five) minutes as needed for chest pain. DISSOLVE 1 TAB UNDER TONGUE FOR CHEST PAIN - IF PAIN REMAINS AFTER 5 MIN, CALL 911 AND REPEAT DOSE. MAX 3 TABS IN 15 MINUTES)   pantoprazole (PROTONIX) 40 MG tablet TAKE 1 TABLET EVERY DAY   QUEtiapine (SEROQUEL) 25 MG tablet Take 1 tablet (25 mg total) by mouth every evening. Between 1830 and 1900   rosuvastatin (CRESTOR) 40 MG tablet Take 1 tablet (40 mg total) by mouth daily.   sertraline (ZOLOFT) 50 MG tablet TAKE 2 TABLETS AT BEDTIME (Patient taking differently: Take 100 mg by mouth at bedtime.)   SPIRIVA HANDIHALER 18 MCG inhalation capsule PLACE 1 CAPSULE INTO HANDIHALER AND INHALE THE CONTENTS DAILY (Patient taking differently: Place 18 mcg into inhaler and inhale daily.)   TRUEplus Lancets 33G MISC TEST BLOOD SUGAR UP TO FOUR TIMES DAILY AS DIRECTED   hydrALAZINE (APRESOLINE) 25 MG tablet Take 25 mg by mouth 2 (two) times daily. Only use is blood pressure get over >160 (Patient not taking: Reported on 06/26/2023)   [DISCONTINUED] metFORMIN (GLUCOPHAGE-XR) 500 MG 24 hr tablet Take 500 mg by mouth daily. (Patient not taking: Reported on 06/15/2023)   No facility-administered encounter medications on file as of 06/26/2023.    Allergies (verified) Codeine sulfate and Morphine sulfate   History: Past  Medical History:  Diagnosis Date   Anginal pain (HCC)    Arthritis    AV block, 1st degree    CAD (coronary artery disease) 1990; 2015   Cardiac cath 1990 with Dr. Riley Kill and [name] Residual CAD, non obstructive per 2015 cath. STEMI status post stent in August 2015.   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  Promus DES to RCA with STEMI   Shortness of breath    Past Surgical History:  Procedure Laterality Date   CATARACT EXTRACTION  2020   right and left eye    CHOLECYSTECTOMY     thinks her appendix was removed at the same time   CORONARY ANGIOPLASTY WITH STENT PLACEMENT  02/18/14   Promus DES to RCA   LEFT HEART CATHETERIZATION WITH CORONARY ANGIOGRAM N/A 02/18/2014   Procedure: LEFT HEART CATHETERIZATION WITH CORONARY ANGIOGRAM;  Surgeon: Corky Crafts, MD;  Location: [facility] CATH LAB;  Service: Cardiovascular;  Laterality: N/A;   PTCA  1990   PTCA of RCA   TRANSTHORACIC ECHOCARDIOGRAM  02/02/2012   mild LVH, EF 55-60%, Normal WM, Gr 1 DD; Mild MR   Family History  Problem Relation Age of Onset   Heart attack Mother 67   Cancer Father 66   Cancer Maternal Grandfather    Cancer Paternal Grandmother    Cancer Paternal Grandfather    Diabetes Son    Liver cancer Brother    Bone cancer Brother    Heart attack Son    Hypertension Son    Diabetes Son    Hyperlipidemia Son    Colon cancer Neg Hx    Ovarian cancer Neg Hx    Uterine cancer Neg Hx    Social History   Socioeconomic History   Marital status: Single     Spouse name: Not on file   Number of children: 2   Years of education: Not on file   Highest education level: Not on file  Occupational History   Occupation: Retired    Associate Professor: RETIRED  Tobacco Use   Smoking status: Former    Current packs/day: 0.00    Average packs/day: 1 pack/day for 55.0 years (55.0 ttl pk-yrs)    Types: Cigarettes    Start date: 09/29/1951    Quit date: 09/29/2006    Years since quitting: 16.7   Smokeless tobacco: Never  Vaping Use   Vaping status: Never Used  Substance and Sexual Activity   Alcohol use: No   Drug use: No   Sexual activity: Not Currently  Other Topics Concern   Not on file  Social History Narrative   Lives with her son - 2 very supportive sons   Right handed   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   Drinks caffeine   Social Determinants of Corporate investment banker Strain: Low Risk  (06/26/2023)   Overall Financial Resource Strain (CARDIA)    Difficulty of Paying Living Expenses: Not hard at all  Food Insecurity: No Food Insecurity (06/26/2023)   Hunger Vital Sign    Worried About Running Out of Food in the Last Year: Never true    Ran Out of Food in the Last Year: Never true  Transportation Needs: No Transportation Needs (06/26/2023)   PRAPARE - Administrator, Civil Service (Medical): No    Lack of Transportation (Non-Medical): No  Physical Activity: Insufficiently Active (06/26/2023)   Exercise Vital Sign    Days of Exercise per Week: 5 days    Minutes of Exercise per Session: 10 min  Stress: No Stress Concern Present (06/26/2023)   [employer] of Occupational Health - Occupational Stress Questionnaire    Feeling of Stress : Only a little  Social Connections: Socially Isolated (06/26/2023)   Social Connection and Isolation Panel [NHANES]    Frequency of Communication with Friends and Family: Twice a week  Frequency of Social Gatherings with Friends and Family: Once a week    Attends Religious  Services: Never    Database administrator or Organizations: No    Attends Engineer, structural: Never    Marital Status: Never married    Tobacco Counseling Counseling given: Not Answered   Clinical Intake:  Pre-visit preparation completed: Yes  Pain : No/denies pain     BMI - recorded: 27.79 Nutritional Status: BMI 25 -29 Overweight Nutritional Risks: None Diabetes: Yes CBG done?: Yes (95 per pt) CBG resulted in Enter/ Edit results?: No Did pt. bring in CBG monitor from home?: No  How often do you need to have someone help you when you read instructions, pamphlets, or other written materials from your doctor or pharmacy?: 1 - Never  Interpreter Needed?: No  Information entered by :: Lanier Ensign, LPN   Activities of Daily Living    06/26/2023    2:25 PM 06/18/2023   10:11 AM  In your present state of health, do you have any difficulty performing the following activities:  Hearing? 1   Comment HOH   Vision? 0   Difficulty concentrating or making decisions? 0   Walking or climbing stairs? 1   Comment restarting theraphyy   Dressing or bathing? 1   Doing errands, shopping? 0 1  Preparing Food and eating ? Y   Comment son assist   Using the Toilet? Y   Comment asssistance needed   In the past six months, have you accidently leaked urine? Y   Comment at times wears a depends   Do you have problems with loss of bowel control? Y   Comment loose stools at times   Managing your Medications? N   Managing your Finances? N   Housekeeping or managing your Housekeeping? N     Patient Care Team: Shelva Majestic, MD as PCP - General (Family Medicine) Kathleene Hazel, MD as PCP - Cardiology (Cardiology) Glennis Brink, MD as Consulting Physician (Dermatology) Rennis Chris, MD as Consulting Physician (Ophthalmology) Van Clines, MD as Consulting Physician (Neurology) Elwyn Reach (Neurology) Erroll Luna, [facility] (Inactive)  (Pharmacist) Chilton Si, MD as Attending Physician (Cardiology)  Indicate any recent Medical Services you may have received from other than [facility] providers in the past year (date may be approximate).     Assessment:   This is a routine wellness examination for Michelle [name].  Hearing/Vision screen Hearing Screening - Comments:: Pt HOH  Vision Screening - Comments:: Pt follows up with Dr Vanessa Barbara for annual eye  exams    Goals Addressed   None    Depression Screen    06/26/2023    2:37 PM 08/19/2022    8:29 AM 06/20/2022    2:25 PM 06/07/2021    2:46 PM 03/12/2021    4:16 PM 01/14/2021    2:22 PM 10/22/2020    5:18 PM  PHQ 2/9 Scores  PHQ - 2 Score 2 1 0 0 0 0 0  PHQ- 9 Score 5 8     2     Fall Risk    06/26/2023    2:42 PM 03/27/2023   11:01 AM 12/19/2022    2:02 PM 11/17/2022   12:57 PM 09/09/2022   10:50 AM  Fall Risk   Falls in the past year? 1 1 1 1 1   Number falls in past yr: 1 1 1 1 1   Injury with Fall? 1 1 0 0 1  Comment head  Risk for fall due to : Impaired balance/gait;Impaired mobility;History of fall(s) Impaired balance/gait History of fall(s)    Follow up Falls prevention discussed Falls prevention discussed Falls evaluation completed Falls evaluation completed Falls evaluation completed    MEDICARE RISK AT HOME: Medicare Risk at Home Any stairs in or around the home?: Yes If so, are there any without handrails?: No Home free of loose throw rugs in walkways, pet beds, electrical cords, etc?: Yes Adequate lighting in your home to reduce risk of falls?: Yes Life alert?: No Use of a cane, walker or w/c?: No Grab bars in the bathroom?: Yes Shower chair or bench in shower?: Yes Elevated toilet seat or a handicapped toilet?: No  TIMED UP AND GO:  Was the test performed?  No    Cognitive Function:    09/13/2022   10:00 AM 02/02/2021    3:00 PM 04/14/2017    4:48 PM  MMSE - Mini Mental State Exam  Not completed:   --  Orientation to time 5 4   Orientation to  Place 3 4   Registration 3 3   Attention/ Calculation 5 0   Recall 1 0   Language- name 2 objects 2 2   Language- repeat 1 1   Language- follow 3 step command 3 3   Language- read & follow direction 1 1   Write a sentence 1 1   Copy design 0 1   Total score 25 20       09/22/2020    2:00 PM  Montreal Cognitive Assessment   Visuospatial/ Executive (0/5) 1  Naming (0/3) 2  Attention: Read list of digits (0/2) 2  Attention: Read list of letters (0/1) 0  Attention: Serial 7 subtraction starting at 100 (0/3) 1  Language: Repeat phrase (0/2) 1  Language : Fluency (0/1) 0  Abstraction (0/2) 0  Delayed Recall (0/5) 0  Orientation (0/6) 6  Total 13  Adjusted Score (based on education) 14      06/26/2023    2:44 PM 06/20/2022    2:32 PM 06/07/2021    2:50 PM 05/25/2020    2:47 PM 05/20/2019   11:36 AM  6CIT Screen  What Year? 0 points 4 points 0 points 0 points 0 points  What month? 0 points 0 points 0 points 0 points 0 points  What time? 0 points 0 points 0 points  0 points  Count back from 20 0 points 0 points 0 points 0 points 0 points  Months in reverse 4 points 0 points 4 points 4 points 0 points  Repeat phrase 10 points 2 points 10 points 10 points 2 points  Total Score 14 points 6 points 14 points  2 points    Immunizations Immunization History  Administered Date(s) Administered   Fluad Quad(high Dose 65+) 08/17/2020, 08/19/2022   Fluad Trivalent(High Dose 65+) 05/26/2023   Influenza Split 04/21/2011, 05/16/2012   Influenza Whole 05/24/2007, 05/01/2009, 04/17/2010   Influenza, High Dose Seasonal PF 03/23/2018, 05/25/2019, 05/01/2021, 08/19/2022   Influenza,inj,Quad PF,6+ Mos 04/14/2013, 04/10/2014, 04/03/2015   Influenza-Unspecified 06/01/2016, 06/15/2017, 05/24/2020   Moderna Covid-19 Vaccine Bivalent Booster 76yrs & up 08/09/2021   Moderna Sars-Covid-2 Vaccination 10/25/2019, 11/22/2019, 10/03/2020, 08/09/2021   Pneumococcal Conjugate-13 09/22/2014, 05/25/2019    Pneumococcal Polysaccharide-23 04/18/2003   Respiratory Syncytial Virus Vaccine,Recomb Aduvanted(Arexvy) 08/19/2022   Rsv, Bivalent, Protein Subunit Rsvpref,pf Verdis Frederickson) 08/19/2022   Tdap 07/29/2011, 02/06/2023   Zoster, Live 04/18/2011    TDAP status: Up to date  Flu Vaccine  status: Up to date  Pneumococcal vaccine status: Up to date  Covid-19 vaccine status: Information provided on how to obtain vaccines.   Qualifies for Shingles Vaccine? Yes   Zostavax completed No   Shingrix Completed?: No.    Education has been provided regarding the importance of this vaccine. Patient has been advised to call insurance company to determine out of pocket expense if they have not yet received this vaccine. Advised may also receive vaccine at local pharmacy or Health Dept. Verbalized acceptance and understanding.  Screening Tests Health Maintenance  Topic Date Due   Zoster Vaccines- Shingrix (1 of 2) 12/30/1951   COVID-19 Vaccine (5 - 2023-24 season) 06/28/2023 (Originally 03/19/2023)   DEXA SCAN  04/21/2043 (Originally 12/31/1999)   OPHTHALMOLOGY EXAM  10/04/2023   HEMOGLOBIN A1C  12/10/2023   FOOT EXAM  12/19/2023   Medicare Annual Wellness (AWV)  06/25/2024   DTaP/Tdap/Td (3 - Td or Tdap) 02/05/2033   Pneumonia Vaccine 40+ Years old  Completed   INFLUENZA VACCINE  Completed   HPV VACCINES  Aged Out    Health Maintenance  Health Maintenance Due  Topic Date Due   Zoster Vaccines- Shingrix (1 of 2) 12/30/1951    Colorectal cancer screening: No longer required.   Mammogram status: No longer required due to age .   Additional Screening:  Vision Screening: Recommended annual ophthalmology exams for early detection of glaucoma and other disorders of the eye. Is the patient up to date with their annual eye exam?  Yes  Who is the provider or what is the name of the office in which the patient attends annual eye exams? Dr Vanessa Barbara  If pt is not established with a provider, would they like  to be referred to a provider to establish care? No .   Dental Screening: Recommended annual dental exams for proper oral hygiene  Diabetic Foot Exam: Diabetic Foot Exam: Completed 12/19/22  Community Resource Referral / Chronic Care Management: CRR required this visit?  No   CCM required this visit?  No     Plan:     I have personally reviewed and noted the following in the patient's chart:   Medical and social history Use of alcohol, tobacco or illicit drugs  Current medications and supplements including opioid prescriptions. Patient is not currently taking opioid prescriptions. Functional ability and status Nutritional status Physical activity Advanced directives List of other physicians Hospitalizations, surgeries, and ER visits in previous 12 months Vitals Screenings to include cognitive, depression, and falls Referrals and appointments  In addition, I have reviewed and discussed with patient certain preventive protocols, quality metrics, and best practice recommendations. A written personalized care plan for preventive services as well as general preventive health recommendations were provided to patient.     Marzella Schlein, LPN   [id number]   After Visit Summary: (MyChart) Due to this being a telephonic visit, the after visit summary with patients personalized plan was offered to patient via MyChart   Nurse Notes: Pt scored 14 on 6 CIT cognition testing,  was very knowledgeable to questions asked just was unable to recall address, was able to state the months only if I said what came before examples. Like what comes before December she could recite November

## 2023-06-27 DIAGNOSIS — Z515 Encounter for palliative care: Secondary | ICD-10-CM | POA: Diagnosis not present

## 2023-06-27 DIAGNOSIS — I5032 Chronic diastolic (congestive) heart failure: Secondary | ICD-10-CM | POA: Diagnosis not present

## 2023-06-28 ENCOUNTER — Telehealth: Payer: Self-pay | Admitting: Family Medicine

## 2023-06-28 DIAGNOSIS — G309 Alzheimer's disease, unspecified: Secondary | ICD-10-CM | POA: Diagnosis not present

## 2023-06-28 DIAGNOSIS — I5032 Chronic diastolic (congestive) heart failure: Secondary | ICD-10-CM | POA: Diagnosis not present

## 2023-06-28 DIAGNOSIS — E1122 Type 2 diabetes mellitus with diabetic chronic kidney disease: Secondary | ICD-10-CM | POA: Diagnosis not present

## 2023-06-28 DIAGNOSIS — I130 Hypertensive heart and chronic kidney disease with heart failure and stage 1 through stage 4 chronic kidney disease, or unspecified chronic kidney disease: Secondary | ICD-10-CM | POA: Diagnosis not present

## 2023-06-28 DIAGNOSIS — F411 Generalized anxiety disorder: Secondary | ICD-10-CM | POA: Diagnosis not present

## 2023-06-28 DIAGNOSIS — N183 Chronic kidney disease, stage 3 unspecified: Secondary | ICD-10-CM | POA: Diagnosis not present

## 2023-06-28 DIAGNOSIS — G40909 Epilepsy, unspecified, not intractable, without status epilepticus: Secondary | ICD-10-CM | POA: Diagnosis not present

## 2023-06-28 DIAGNOSIS — G9341 Metabolic encephalopathy: Secondary | ICD-10-CM | POA: Diagnosis not present

## 2023-06-28 DIAGNOSIS — F0284 Dementia in other diseases classified elsewhere, unspecified severity, with anxiety: Secondary | ICD-10-CM | POA: Diagnosis not present

## 2023-06-28 NOTE — Telephone Encounter (Signed)
Received faxed  document Home Health Certificate (Order ID [id number] ), to be filled out by provider. Patient requested to send it back via Fax . Document is located in providers tray at front office.Please advise

## 2023-06-30 DIAGNOSIS — G9341 Metabolic encephalopathy: Secondary | ICD-10-CM | POA: Diagnosis not present

## 2023-06-30 DIAGNOSIS — I5032 Chronic diastolic (congestive) heart failure: Secondary | ICD-10-CM | POA: Diagnosis not present

## 2023-06-30 DIAGNOSIS — I130 Hypertensive heart and chronic kidney disease with heart failure and stage 1 through stage 4 chronic kidney disease, or unspecified chronic kidney disease: Secondary | ICD-10-CM | POA: Diagnosis not present

## 2023-06-30 DIAGNOSIS — F0284 Dementia in other diseases classified elsewhere, unspecified severity, with anxiety: Secondary | ICD-10-CM | POA: Diagnosis not present

## 2023-06-30 DIAGNOSIS — N183 Chronic kidney disease, stage 3 unspecified: Secondary | ICD-10-CM | POA: Diagnosis not present

## 2023-06-30 DIAGNOSIS — E1122 Type 2 diabetes mellitus with diabetic chronic kidney disease: Secondary | ICD-10-CM | POA: Diagnosis not present

## 2023-06-30 DIAGNOSIS — G40909 Epilepsy, unspecified, not intractable, without status epilepticus: Secondary | ICD-10-CM | POA: Diagnosis not present

## 2023-06-30 DIAGNOSIS — G309 Alzheimer's disease, unspecified: Secondary | ICD-10-CM | POA: Diagnosis not present

## 2023-06-30 DIAGNOSIS — F411 Generalized anxiety disorder: Secondary | ICD-10-CM | POA: Diagnosis not present

## 2023-07-03 NOTE — Telephone Encounter (Signed)
Forms completed and faxed back.

## 2023-07-04 ENCOUNTER — Ambulatory Visit: Payer: Medicare HMO | Admitting: Family Medicine

## 2023-07-04 ENCOUNTER — Other Ambulatory Visit: Payer: Self-pay

## 2023-07-04 VITALS — BP 130/60 | HR 70 | Ht 65.0 in

## 2023-07-04 DIAGNOSIS — M25561 Pain in right knee: Secondary | ICD-10-CM | POA: Diagnosis not present

## 2023-07-04 DIAGNOSIS — M25562 Pain in left knee: Secondary | ICD-10-CM

## 2023-07-04 DIAGNOSIS — G8929 Other chronic pain: Secondary | ICD-10-CM

## 2023-07-04 DIAGNOSIS — M170 Bilateral primary osteoarthritis of knee: Secondary | ICD-10-CM

## 2023-07-04 MED ORDER — TRIAMCINOLONE ACETONIDE 32 MG IX SRER
32.0000 mg | Freq: Once | INTRA_ARTICULAR | Status: AC
  Administered 2023-07-04: 32 mg via INTRA_ARTICULAR

## 2023-07-04 NOTE — Progress Notes (Signed)
Michelle Payor, PhD, LAT, ATC acting as a scribe for Michelle Graham, MD.  Michelle [name] is a 89 y.o. female who presents to [facility] at [facility] today for cont'd bilat knee pain. Pt was last seen by Dr. Denyse Amass on 03/08/23 and was given repeat bilat knee Zilretta injections.   Today, pt reports bilat knee pain returned about 3 wks ago. She denies any falls. She c/o both knees are very painful and is wanting repeat Zilretta injections today.   Dx imaging: 02/06/23 L knee XR 04/04/22 R & L knee XR             07/13/14 R knee XR  Pertinent review of systems: No fevers or chills  Relevant historical information: Hypertension heart failure diabetes dementia   Exam:  BP 130/60   Pulse 70   Ht 5\' 5"  (1.651 m)   SpO2 97%   BMI 27.79 kg/m  General: Elderly appearing woman seated in a wheelchair.  No acute distress.  MSK: Knees bilaterally mild effusion normal motion with crepitation.    Lab and Radiology Results   Zilretta injection bilateral knee Procedure: Real-time Ultrasound Guided Injection of right knee joint anterior medial approach Device: Philips Affiniti 50G Images permanently stored and available for review in PACS Verbal informed consent obtained.  Discussed risks and benefits of procedure. Warned about infection, hyperglycemia bleeding, damage to structures among others. Patient expresses understanding and agreement Time-out conducted.   Noted no overlying erythema, induration, or other signs of local infection.   Skin prepped in a sterile fashion.   Local anesthesia: Topical Ethyl chloride.   With sterile technique and under real time ultrasound guidance: Zilretta 32 mg injected into knee joint. Fluid seen entering the joint capsule.   Completed without difficulty   Advised to call if fevers/chills, erythema, induration, drainage, or persistent bleeding.   Images permanently stored and available for review in the ultrasound unit.  Impression:  Technically successful ultrasound guided injection.   Procedure: Real-time Ultrasound Guided Injection of left knee joint anterior medial approach Device: Philips Affiniti 50G Images permanently stored and available for review in PACS Verbal informed consent obtained.  Discussed risks and benefits of procedure. Warned about infection, hyperglycemia bleeding, damage to structures among others. Patient expresses understanding and agreement Time-out conducted.   Noted no overlying erythema, induration, or other signs of local infection.   Skin prepped in a sterile fashion.   Local anesthesia: Topical Ethyl chloride.   With sterile technique and under real time ultrasound guidance: Zilretta 32 mg injected into knee joint. Fluid seen entering the joint capsule.   Completed without difficulty   Advised to call if fevers/chills, erythema, induration, drainage, or persistent bleeding.   Images permanently stored and available for review in the ultrasound unit.  Impression: Technically successful ultrasound guided injection.  Lot number: [id number]     Assessment and Plan: 89 y.o. female with bilateral knee pain due to osteoarthritis.  This is an acute exacerbation of a chronic problem.  Plan for repeat Zilretta injection today.  This is working quite well for her.  If needed could consider genicular artery embolization.   PDMP not reviewed this encounter. Orders Placed This Encounter  Procedures   [place] LIMITED JOINT SPACE STRUCTURES LOW BILAT(NO LINKED CHARGES)    Reason for Exam (SYMPTOM  OR DIAGNOSIS REQUIRED):   bilateral knee pain    Preferred imaging location?:   [facility] ordered this encounter  Medications  Triamcinolone Acetonide (ZILRETTA) intra-articular injection 32 mg   Triamcinolone Acetonide (ZILRETTA) intra-articular injection 32 mg     Discussed warning signs or symptoms. Please see discharge instructions. Patient expresses  understanding.   The above documentation has been reviewed and is accurate and complete Michelle [name], M.D.

## 2023-07-04 NOTE — Patient Instructions (Addendum)
Thank you for coming in today.   You received an injection today. Seek immediate medical attention if the joint becomes red, extremely painful, or is oozing fluid.   Check back as needed 

## 2023-07-05 ENCOUNTER — Observation Stay (HOSPITAL_COMMUNITY): Payer: Medicare HMO

## 2023-07-05 ENCOUNTER — Emergency Department (HOSPITAL_COMMUNITY): Payer: Medicare HMO

## 2023-07-05 ENCOUNTER — Inpatient Hospital Stay (HOSPITAL_COMMUNITY)
Admission: EM | Admit: 2023-07-05 | Discharge: 2023-07-24 | DRG: 100 | Disposition: A | Discharge status: Skilled Nursing Facility | Payer: Medicare HMO | Attending: Internal Medicine | Admitting: Internal Medicine

## 2023-07-05 ENCOUNTER — Other Ambulatory Visit: Payer: Self-pay

## 2023-07-05 ENCOUNTER — Encounter (HOSPITAL_COMMUNITY): Payer: Self-pay

## 2023-07-05 ENCOUNTER — Other Ambulatory Visit (HOSPITAL_COMMUNITY): Payer: Self-pay

## 2023-07-05 ENCOUNTER — Telehealth: Payer: Self-pay | Admitting: Pharmacy Technician

## 2023-07-05 DIAGNOSIS — Z833 Family history of diabetes mellitus: Secondary | ICD-10-CM

## 2023-07-05 DIAGNOSIS — E854 Organ-limited amyloidosis: Secondary | ICD-10-CM | POA: Diagnosis present

## 2023-07-05 DIAGNOSIS — F0394 Unspecified dementia, unspecified severity, with anxiety: Secondary | ICD-10-CM | POA: Diagnosis present

## 2023-07-05 DIAGNOSIS — G40919 Epilepsy, unspecified, intractable, without status epilepticus: Secondary | ICD-10-CM | POA: Diagnosis not present

## 2023-07-05 DIAGNOSIS — I5032 Chronic diastolic (congestive) heart failure: Secondary | ICD-10-CM | POA: Diagnosis present

## 2023-07-05 DIAGNOSIS — F32A Depression, unspecified: Secondary | ICD-10-CM | POA: Diagnosis present

## 2023-07-05 DIAGNOSIS — E785 Hyperlipidemia, unspecified: Secondary | ICD-10-CM | POA: Diagnosis present

## 2023-07-05 DIAGNOSIS — N3000 Acute cystitis without hematuria: Secondary | ICD-10-CM

## 2023-07-05 DIAGNOSIS — N183 Chronic kidney disease, stage 3 unspecified: Secondary | ICD-10-CM | POA: Diagnosis not present

## 2023-07-05 DIAGNOSIS — E669 Obesity, unspecified: Secondary | ICD-10-CM | POA: Diagnosis present

## 2023-07-05 DIAGNOSIS — Z993 Dependence on wheelchair: Secondary | ICD-10-CM | POA: Diagnosis not present

## 2023-07-05 DIAGNOSIS — R2689 Other abnormalities of gait and mobility: Secondary | ICD-10-CM | POA: Diagnosis not present

## 2023-07-05 DIAGNOSIS — Z79899 Other long term (current) drug therapy: Secondary | ICD-10-CM

## 2023-07-05 DIAGNOSIS — Z7982 Long term (current) use of aspirin: Secondary | ICD-10-CM

## 2023-07-05 DIAGNOSIS — F411 Generalized anxiety disorder: Secondary | ICD-10-CM | POA: Diagnosis present

## 2023-07-05 DIAGNOSIS — F0150 Vascular dementia without behavioral disturbance: Secondary | ICD-10-CM | POA: Diagnosis not present

## 2023-07-05 DIAGNOSIS — R569 Unspecified convulsions: Secondary | ICD-10-CM

## 2023-07-05 DIAGNOSIS — Z7189 Other specified counseling: Secondary | ICD-10-CM | POA: Diagnosis not present

## 2023-07-05 DIAGNOSIS — I609 Nontraumatic subarachnoid hemorrhage, unspecified: Secondary | ICD-10-CM | POA: Diagnosis present

## 2023-07-05 DIAGNOSIS — Z6827 Body mass index (BMI) 27.0-27.9, adult: Secondary | ICD-10-CM

## 2023-07-05 DIAGNOSIS — I110 Hypertensive heart disease with heart failure: Secondary | ICD-10-CM | POA: Diagnosis present

## 2023-07-05 DIAGNOSIS — R4182 Altered mental status, unspecified: Secondary | ICD-10-CM | POA: Diagnosis not present

## 2023-07-05 DIAGNOSIS — Z794 Long term (current) use of insulin: Secondary | ICD-10-CM

## 2023-07-05 DIAGNOSIS — I252 Old myocardial infarction: Secondary | ICD-10-CM | POA: Diagnosis not present

## 2023-07-05 DIAGNOSIS — Z7951 Long term (current) use of inhaled steroids: Secondary | ICD-10-CM

## 2023-07-05 DIAGNOSIS — G40909 Epilepsy, unspecified, not intractable, without status epilepticus: Principal | ICD-10-CM

## 2023-07-05 DIAGNOSIS — R0989 Other specified symptoms and signs involving the circulatory and respiratory systems: Secondary | ICD-10-CM | POA: Diagnosis not present

## 2023-07-05 DIAGNOSIS — N858 Other specified noninflammatory disorders of uterus: Secondary | ICD-10-CM | POA: Diagnosis present

## 2023-07-05 DIAGNOSIS — M6281 Muscle weakness (generalized): Secondary | ICD-10-CM | POA: Diagnosis not present

## 2023-07-05 DIAGNOSIS — T426X5A Adverse effect of other antiepileptic and sedative-hypnotic drugs, initial encounter: Secondary | ICD-10-CM | POA: Diagnosis not present

## 2023-07-05 DIAGNOSIS — E119 Type 2 diabetes mellitus without complications: Secondary | ICD-10-CM

## 2023-07-05 DIAGNOSIS — R41841 Cognitive communication deficit: Secondary | ICD-10-CM | POA: Diagnosis not present

## 2023-07-05 DIAGNOSIS — R0682 Tachypnea, not elsewhere classified: Secondary | ICD-10-CM | POA: Diagnosis present

## 2023-07-05 DIAGNOSIS — F0393 Unspecified dementia, unspecified severity, with mood disturbance: Secondary | ICD-10-CM | POA: Diagnosis present

## 2023-07-05 DIAGNOSIS — G40301 Generalized idiopathic epilepsy and epileptic syndromes, not intractable, with status epilepticus: Secondary | ICD-10-CM | POA: Diagnosis not present

## 2023-07-05 DIAGNOSIS — I2510 Atherosclerotic heart disease of native coronary artery without angina pectoris: Secondary | ICD-10-CM | POA: Diagnosis present

## 2023-07-05 DIAGNOSIS — E1122 Type 2 diabetes mellitus with diabetic chronic kidney disease: Secondary | ICD-10-CM | POA: Diagnosis present

## 2023-07-05 DIAGNOSIS — B961 Klebsiella pneumoniae [K. pneumoniae] as the cause of diseases classified elsewhere: Secondary | ICD-10-CM | POA: Diagnosis present

## 2023-07-05 DIAGNOSIS — R55 Syncope and collapse: Secondary | ICD-10-CM | POA: Diagnosis not present

## 2023-07-05 DIAGNOSIS — F0390 Unspecified dementia without behavioral disturbance: Secondary | ICD-10-CM | POA: Diagnosis present

## 2023-07-05 DIAGNOSIS — Z7902 Long term (current) use of antithrombotics/antiplatelets: Secondary | ICD-10-CM

## 2023-07-05 DIAGNOSIS — B9689 Other specified bacterial agents as the cause of diseases classified elsewhere: Secondary | ICD-10-CM | POA: Diagnosis not present

## 2023-07-05 DIAGNOSIS — Z66 Do not resuscitate: Secondary | ICD-10-CM | POA: Diagnosis present

## 2023-07-05 DIAGNOSIS — R000 Tachycardia, unspecified: Secondary | ICD-10-CM | POA: Diagnosis present

## 2023-07-05 DIAGNOSIS — I700 Atherosclerosis of aorta: Secondary | ICD-10-CM | POA: Diagnosis not present

## 2023-07-05 DIAGNOSIS — G928 Other toxic encephalopathy: Secondary | ICD-10-CM | POA: Diagnosis present

## 2023-07-05 DIAGNOSIS — I10 Essential (primary) hypertension: Secondary | ICD-10-CM | POA: Diagnosis not present

## 2023-07-05 DIAGNOSIS — N390 Urinary tract infection, site not specified: Secondary | ICD-10-CM | POA: Diagnosis present

## 2023-07-05 DIAGNOSIS — J449 Chronic obstructive pulmonary disease, unspecified: Secondary | ICD-10-CM | POA: Diagnosis present

## 2023-07-05 DIAGNOSIS — S199XXA Unspecified injury of neck, initial encounter: Secondary | ICD-10-CM | POA: Diagnosis not present

## 2023-07-05 DIAGNOSIS — Z955 Presence of coronary angioplasty implant and graft: Secondary | ICD-10-CM | POA: Diagnosis not present

## 2023-07-05 DIAGNOSIS — Z7401 Bed confinement status: Secondary | ICD-10-CM | POA: Diagnosis not present

## 2023-07-05 DIAGNOSIS — Z800 Family history of malignant neoplasm of digestive organs: Secondary | ICD-10-CM

## 2023-07-05 DIAGNOSIS — Z9049 Acquired absence of other specified parts of digestive tract: Secondary | ICD-10-CM

## 2023-07-05 DIAGNOSIS — Z885 Allergy status to narcotic agent status: Secondary | ICD-10-CM

## 2023-07-05 DIAGNOSIS — S0990XA Unspecified injury of head, initial encounter: Secondary | ICD-10-CM | POA: Diagnosis not present

## 2023-07-05 DIAGNOSIS — I680 Cerebral amyloid angiopathy: Secondary | ICD-10-CM | POA: Diagnosis present

## 2023-07-05 DIAGNOSIS — Z87891 Personal history of nicotine dependence: Secondary | ICD-10-CM

## 2023-07-05 DIAGNOSIS — Z79890 Hormone replacement therapy (postmenopausal): Secondary | ICD-10-CM

## 2023-07-05 DIAGNOSIS — R262 Difficulty in walking, not elsewhere classified: Secondary | ICD-10-CM | POA: Diagnosis present

## 2023-07-05 DIAGNOSIS — I6900 Unspecified sequelae of nontraumatic subarachnoid hemorrhage: Secondary | ICD-10-CM | POA: Diagnosis not present

## 2023-07-05 DIAGNOSIS — Z8249 Family history of ischemic heart disease and other diseases of the circulatory system: Secondary | ICD-10-CM

## 2023-07-05 DIAGNOSIS — D509 Iron deficiency anemia, unspecified: Secondary | ICD-10-CM | POA: Diagnosis present

## 2023-07-05 DIAGNOSIS — R404 Transient alteration of awareness: Secondary | ICD-10-CM | POA: Diagnosis not present

## 2023-07-05 DIAGNOSIS — Z515 Encounter for palliative care: Secondary | ICD-10-CM | POA: Diagnosis not present

## 2023-07-05 DIAGNOSIS — E1169 Type 2 diabetes mellitus with other specified complication: Secondary | ICD-10-CM | POA: Diagnosis present

## 2023-07-05 DIAGNOSIS — R278 Other lack of coordination: Secondary | ICD-10-CM | POA: Diagnosis not present

## 2023-07-05 DIAGNOSIS — N939 Abnormal uterine and vaginal bleeding, unspecified: Secondary | ICD-10-CM | POA: Diagnosis present

## 2023-07-05 LAB — CBC WITH DIFFERENTIAL/PLATELET
Abs Immature Granulocytes: 0.05 10*3/uL (ref 0.00–0.07)
Basophils Absolute: 0 10*3/uL (ref 0.0–0.1)
Basophils Relative: 0 %
Eosinophils Absolute: 0 10*3/uL (ref 0.0–0.5)
Eosinophils Relative: 0 %
HCT: 41.2 % (ref 36.0–46.0)
Hemoglobin: 12.7 g/dL (ref 12.0–15.0)
Immature Granulocytes: 1 %
Lymphocytes Relative: 16 %
Lymphs Abs: 1.5 10*3/uL (ref 0.7–4.0)
MCH: 26.7 pg (ref 26.0–34.0)
MCHC: 30.8 g/dL (ref 30.0–36.0)
MCV: 86.7 fL (ref 80.0–100.0)
Monocytes Absolute: 0.5 10*3/uL (ref 0.1–1.0)
Monocytes Relative: 5 %
Neutro Abs: 6.9 10*3/uL (ref 1.7–7.7)
Neutrophils Relative %: 78 %
Platelets: 360 10*3/uL (ref 150–400)
RBC: 4.75 MIL/uL (ref 3.87–5.11)
RDW: 15.1 % (ref 11.5–15.5)
WBC: 8.9 10*3/uL (ref 4.0–10.5)
nRBC: 0 % (ref 0.0–0.2)

## 2023-07-05 LAB — GLUCOSE, CAPILLARY
Glucose-Capillary: 103 mg/dL — ABNORMAL HIGH (ref 70–99)
Glucose-Capillary: 100 mg/dL — ABNORMAL HIGH (ref 70–99)

## 2023-07-05 LAB — URINALYSIS, ROUTINE W REFLEX MICROSCOPIC
Bilirubin Urine: NEGATIVE
Glucose, UA: NEGATIVE mg/dL
Hgb urine dipstick: NEGATIVE
Ketones, ur: NEGATIVE mg/dL
Nitrite: NEGATIVE
Protein, ur: 100 mg/dL — AB
Specific Gravity, Urine: 1.011 (ref 1.005–1.030)
WBC, UA: >50 WBC/hpf (ref 0–5)
pH: 6 (ref 5.0–8.0)

## 2023-07-05 LAB — CBG MONITORING, ED
Glucose-Capillary: 159 mg/dL — ABNORMAL HIGH (ref 70–99)
Glucose-Capillary: 112 mg/dL — ABNORMAL HIGH (ref 70–99)

## 2023-07-05 LAB — BASIC METABOLIC PANEL
Anion gap: 13 (ref 5–15)
BUN: 26 mg/dL — ABNORMAL HIGH (ref 8–23)
CO2: 20 mmol/L — ABNORMAL LOW (ref 22–32)
Calcium: 10.1 mg/dL (ref 8.9–10.3)
Chloride: 105 mmol/L (ref 98–111)
Creatinine, Ser: 0.84 mg/dL (ref 0.44–1.00)
GFR, Estimated: >60 mL/min (ref >60)
Glucose, Bld: 168 mg/dL — ABNORMAL HIGH (ref 70–99)
Potassium: 4.1 mmol/L (ref 3.5–5.1)
Sodium: 138 mmol/L (ref 135–145)

## 2023-07-05 LAB — HEPATIC FUNCTION PANEL
ALT: 18 U/L (ref 0–44)
AST: 25 U/L (ref 15–41)
Albumin: 3.6 g/dL (ref 3.5–5.0)
Alkaline Phosphatase: 58 U/L (ref 38–126)
Bilirubin, Direct: <0.1 mg/dL (ref 0.0–0.2)
Total Bilirubin: 0.5 mg/dL (ref <1.2)
Total Protein: 7.4 g/dL (ref 6.5–8.1)

## 2023-07-05 MED ORDER — SODIUM CHLORIDE 0.9 % IV SOLN
1.0000 g | INTRAVENOUS | Status: DC
  Administered 2023-07-06 – 2023-07-09 (×4): 1 g via INTRAVENOUS
  Filled 2023-07-05 (×5): qty 10

## 2023-07-05 MED ORDER — IOHEXOL 350 MG/ML SOLN
50.0000 mL | Freq: Once | INTRAVENOUS | Status: AC | PRN
  Administered 2023-07-05: 50 mL via INTRAVENOUS

## 2023-07-05 MED ORDER — LEVETIRACETAM IN NACL 500 MG/100ML IV SOLN
500.0000 mg | Freq: Two times a day (BID) | INTRAVENOUS | Status: DC
  Administered 2023-07-05 – 2023-07-06 (×3): 500 mg via INTRAVENOUS
  Filled 2023-07-05 (×3): qty 100

## 2023-07-05 MED ORDER — LABETALOL HCL 5 MG/ML IV SOLN
5.0000 mg | INTRAVENOUS | Status: DC | PRN
  Administered 2023-07-06 – 2023-07-15 (×21): 5 mg via INTRAVENOUS
  Filled 2023-07-05 (×20): qty 4

## 2023-07-05 MED ORDER — POLYETHYLENE GLYCOL 3350 17 G PO PACK: 17.0000 g | PACK | Freq: Every day | ORAL | Status: DC | PRN

## 2023-07-05 MED ORDER — LOPERAMIDE HCL 2 MG PO CAPS: 2.0000 mg | ORAL_CAPSULE | Freq: Two times a day (BID) | ORAL | Status: DC | PRN

## 2023-07-05 MED ORDER — FENTANYL CITRATE PF 50 MCG/ML IJ SOSY
12.5000 ug | PREFILLED_SYRINGE | Freq: Once | INTRAMUSCULAR | Status: AC | PRN
  Administered 2023-07-05: 12.5 ug via INTRAVENOUS
  Filled 2023-07-05: qty 1

## 2023-07-05 MED ORDER — ICOSAPENT ETHYL 1 G PO CAPS
1.0000 g | ORAL_CAPSULE | Freq: Two times a day (BID) | ORAL | Status: DC
  Administered 2023-07-06 – 2023-07-24 (×37): 1 g via ORAL
  Filled 2023-07-05 (×40): qty 1

## 2023-07-05 MED ORDER — IOHEXOL 350 MG/ML SOLN
75.0000 mL | Freq: Once | INTRAVENOUS | Status: AC | PRN
  Administered 2023-07-05: 75 mL via INTRAVENOUS

## 2023-07-05 MED ORDER — ENALAPRIL MALEATE 10 MG PO TABS
20.0000 mg | ORAL_TABLET | Freq: Every day | ORAL | Status: DC
  Administered 2023-07-05 – 2023-07-23 (×18): 20 mg via ORAL
  Filled 2023-07-05 (×20): qty 2

## 2023-07-05 MED ORDER — LORAZEPAM 2 MG/ML IJ SOLN: 2.0000 mg | INTRAMUSCULAR | Status: DC | PRN

## 2023-07-05 MED ORDER — MEDROXYPROGESTERONE ACETATE 10 MG PO TABS
10.0000 mg | ORAL_TABLET | Freq: Every day | ORAL | Status: DC
  Administered 2023-07-06 – 2023-07-24 (×19): 10 mg via ORAL
  Filled 2023-07-05 (×20): qty 1

## 2023-07-05 MED ORDER — LAMOTRIGINE 25 MG PO TABS
50.0000 mg | ORAL_TABLET | Freq: Two times a day (BID) | ORAL | Status: DC
  Administered 2023-07-05 – 2023-07-24 (×38): 50 mg via ORAL
  Filled 2023-07-05 (×38): qty 2

## 2023-07-05 MED ORDER — LORAZEPAM 2 MG/ML IJ SOLN
INTRAMUSCULAR | Status: AC
  Administered 2023-07-05: 2 mg via INTRAVENOUS
  Filled 2023-07-05: qty 1

## 2023-07-05 MED ORDER — SODIUM CHLORIDE 0.9 % IV SOLN
1.0000 g | Freq: Once | INTRAVENOUS | Status: AC
  Administered 2023-07-05: 1 g via INTRAVENOUS
  Filled 2023-07-05: qty 10

## 2023-07-05 MED ORDER — MOMETASONE FURO-FORMOTEROL FUM 200-5 MCG/ACT IN AERO
2.0000 | INHALATION_SPRAY | Freq: Two times a day (BID) | RESPIRATORY_TRACT | Status: DC
  Administered 2023-07-06 – 2023-07-23 (×31): 2 via RESPIRATORY_TRACT
  Filled 2023-07-05: qty 8.8

## 2023-07-05 MED ORDER — CARVEDILOL 12.5 MG PO TABS
12.5000 mg | ORAL_TABLET | Freq: Two times a day (BID) | ORAL | Status: DC
  Administered 2023-07-05 – 2023-07-24 (×38): 12.5 mg via ORAL
  Filled 2023-07-05 (×38): qty 1

## 2023-07-05 MED ORDER — INSULIN ASPART 100 UNIT/ML IJ SOLN
0.0000 [IU] | INTRAMUSCULAR | Status: DC
  Administered 2023-07-07: 3 [IU] via SUBCUTANEOUS
  Administered 2023-07-07 (×3): 2 [IU] via SUBCUTANEOUS
  Administered 2023-07-08: 3 [IU] via SUBCUTANEOUS
  Administered 2023-07-08: 2 [IU] via SUBCUTANEOUS
  Administered 2023-07-08 (×2): 3 [IU] via SUBCUTANEOUS
  Administered 2023-07-09 (×5): 2 [IU] via SUBCUTANEOUS
  Administered 2023-07-09: 3 [IU] via SUBCUTANEOUS
  Administered 2023-07-10: 2 [IU] via SUBCUTANEOUS
  Administered 2023-07-10 (×2): 3 [IU] via SUBCUTANEOUS
  Administered 2023-07-10: 2 [IU] via SUBCUTANEOUS
  Administered 2023-07-11: 3 [IU] via SUBCUTANEOUS
  Administered 2023-07-11 (×3): 2 [IU] via SUBCUTANEOUS
  Administered 2023-07-12: 3 [IU] via SUBCUTANEOUS
  Administered 2023-07-12: 2 [IU] via SUBCUTANEOUS
  Administered 2023-07-12: 3 [IU] via SUBCUTANEOUS
  Administered 2023-07-12 – 2023-07-13 (×4): 2 [IU] via SUBCUTANEOUS
  Administered 2023-07-13: 5 [IU] via SUBCUTANEOUS
  Administered 2023-07-13 (×2): 2 [IU] via SUBCUTANEOUS
  Administered 2023-07-14: 3 [IU] via SUBCUTANEOUS
  Administered 2023-07-14 – 2023-07-15 (×5): 2 [IU] via SUBCUTANEOUS
  Administered 2023-07-15: 3 [IU] via SUBCUTANEOUS
  Administered 2023-07-15 (×3): 2 [IU] via SUBCUTANEOUS
  Administered 2023-07-16: 3 [IU] via SUBCUTANEOUS
  Administered 2023-07-16 – 2023-07-17 (×5): 2 [IU] via SUBCUTANEOUS
  Administered 2023-07-17: 3 [IU] via SUBCUTANEOUS
  Administered 2023-07-17 (×2): 2 [IU] via SUBCUTANEOUS
  Administered 2023-07-18: 3 [IU] via SUBCUTANEOUS
  Administered 2023-07-18: 2 [IU] via SUBCUTANEOUS
  Administered 2023-07-18: 5 [IU] via SUBCUTANEOUS
  Administered 2023-07-18 (×2): 2 [IU] via SUBCUTANEOUS
  Administered 2023-07-18 – 2023-07-20 (×5): 3 [IU] via SUBCUTANEOUS
  Administered 2023-07-20 – 2023-07-21 (×4): 2 [IU] via SUBCUTANEOUS
  Administered 2023-07-21: 3 [IU] via SUBCUTANEOUS
  Administered 2023-07-21 – 2023-07-22 (×2): 2 [IU] via SUBCUTANEOUS

## 2023-07-05 MED ORDER — ACETAMINOPHEN 325 MG PO TABS
650.0000 mg | ORAL_TABLET | Freq: Four times a day (QID) | ORAL | Status: DC | PRN
  Administered 2023-07-07 – 2023-07-22 (×7): 650 mg via ORAL
  Filled 2023-07-05 (×7): qty 2

## 2023-07-05 MED ORDER — UMECLIDINIUM BROMIDE 62.5 MCG/ACT IN AEPB
1.0000 | INHALATION_SPRAY | Freq: Every day | RESPIRATORY_TRACT | Status: DC
  Administered 2023-07-06 – 2023-07-23 (×15): 1 via RESPIRATORY_TRACT
  Filled 2023-07-05 (×3): qty 7

## 2023-07-05 MED ORDER — LORAZEPAM 2 MG/ML IJ SOLN
INTRAMUSCULAR | Status: AC
  Filled 2023-07-05: qty 1

## 2023-07-05 MED ORDER — ICOSAPENT ETHYL 1 G PO CAPS
1.0000 g | ORAL_CAPSULE | ORAL | Status: DC
Start: 2023-07-05 — End: 2023-07-05

## 2023-07-05 MED ORDER — SODIUM CHLORIDE 0.9% FLUSH
3.0000 mL | Freq: Two times a day (BID) | INTRAVENOUS | Status: DC
  Administered 2023-07-05 – 2023-07-19 (×29): 3 mL via INTRAVENOUS

## 2023-07-05 MED ORDER — TIOTROPIUM BROMIDE MONOHYDRATE 18 MCG IN CAPS
18.0000 ug | ORAL_CAPSULE | Freq: Every day | RESPIRATORY_TRACT | Status: DC
  Filled 2023-07-05 (×2): qty 5

## 2023-07-05 MED ORDER — LEVETIRACETAM IN NACL 1500 MG/100ML IV SOLN
1500.0000 mg | Freq: Once | INTRAVENOUS | Status: AC
  Administered 2023-07-05: 1500 mg via INTRAVENOUS
  Filled 2023-07-05: qty 100

## 2023-07-05 MED ORDER — ICOSAPENT ETHYL 1 G PO CAPS
2.0000 g | ORAL_CAPSULE | Freq: Every day | ORAL | Status: DC
  Administered 2023-07-06 – 2023-07-23 (×18): 2 g via ORAL
  Filled 2023-07-05 (×20): qty 2

## 2023-07-05 MED ORDER — ROSUVASTATIN CALCIUM 20 MG PO TABS
40.0000 mg | ORAL_TABLET | Freq: Every day | ORAL | Status: DC
  Administered 2023-07-06 – 2023-07-24 (×19): 40 mg via ORAL
  Filled 2023-07-05 (×19): qty 2

## 2023-07-05 MED ORDER — PANTOPRAZOLE SODIUM 40 MG PO TBEC
40.0000 mg | DELAYED_RELEASE_TABLET | Freq: Every day | ORAL | Status: DC
  Administered 2023-07-06 – 2023-07-24 (×19): 40 mg via ORAL
  Filled 2023-07-05 (×19): qty 1

## 2023-07-05 MED ORDER — ACETAMINOPHEN 650 MG RE SUPP: 650.0000 mg | Freq: Four times a day (QID) | RECTAL | Status: DC | PRN

## 2023-07-05 MED ORDER — ALBUTEROL SULFATE (2.5 MG/3ML) 0.083% IN NEBU: 2.5000 mg | INHALATION_SOLUTION | RESPIRATORY_TRACT | Status: DC | PRN

## 2023-07-05 MED ORDER — QUETIAPINE FUMARATE 25 MG PO TABS
25.0000 mg | ORAL_TABLET | Freq: Every evening | ORAL | Status: DC
  Administered 2023-07-05 – 2023-07-11 (×7): 25 mg via ORAL
  Filled 2023-07-05 (×7): qty 1

## 2023-07-05 MED ORDER — LORAZEPAM 2 MG/ML IJ SOLN
2.0000 mg | Freq: Once | INTRAMUSCULAR | Status: AC
Start: 2023-07-05 — End: 2023-07-05

## 2023-07-05 NOTE — Telephone Encounter (Signed)
Pharmacy Patient Advocate Encounter  Received notification from [facility] that Prior Authorization for  QUETIAPINE FUMARATE 25 MG TAB has been APPROVED from 07/05/23 to 07/17/24. Ran test claim, Copay is $0.00. This test claim was processed through [facility]- copay amounts may vary at other pharmacies due to pharmacy/plan contracts, or as the patient moves through the different stages of their insurance plan.  To be noted this prescription appears to have gone to [facility] but per last patient message on 06/21/23 she wanted the prescription to go to [facility] since she only had 8 tablets left.   PA #/Case ID/Reference #: [id number]

## 2023-07-05 NOTE — ED Notes (Signed)
ED TO INPATIENT HANDOFF REPORT  ED Nurse Name and Phone #: Berna Spare [phone]  S Name/Age/Gender Michelle [name] 89 y.o. female Room/Bed: 046C/046C  Code Status   Code Status: Do not attempt resuscitation (DNR) PRE-ARREST INTERVENTIONS DESIRED  Home/SNF/Other Home Patient oriented to: self Is this baseline?  Pt is still drowsy from previous medication. Minimal response to stimulus  Triage Complete: Triage complete  Chief Complaint Seizure [facility]) [R56.9]  Triage Note Pt from home with ems for seizure. Pt found by her son seizing, unknown how long she had been seizing for prior to son finding her but she continued to seize for another 3 mins (full body shaking). Pt has hx of seizures, takes lamictal with a recent increase in her dose. Pt at baseline is alert, oriented and ambulatory with assistance. Pupils equal and reactive. Pt altered, pulling at things and trying to get out of bed. Hypertensive 240/112   Allergies Allergies  Allergen Reactions   Haldol [Haloperidol]    Codeine Sulfate Nausea Only   Morphine Sulfate Nausea Only    Level of Care/Admitting Diagnosis ED Disposition     ED Disposition  Admit   Condition  --   Comment  Hospital [facility]: [facility] [[id number]]  Level of Care: Progressive [102]  Admit to Progressive based on following criteria: NEUROLOGICAL AND NEUROSURGICAL complex patients with significant risk of instability, who do not meet ICU criteria, yet require close observation or frequent assessment (< / = every 2 - 4 hours) with medical / nursing intervention.  May place patient in observation at [facility] or [facility] if equivalent level of care is available:: No  Covid Evaluation: Asymptomatic - no recent exposure (last 10 days) testing not required  Diagnosis: Seizure [facility]) [[id number]]  Admitting Physician: Synetta Fail [[id number]]  Attending Physician: Synetta Fail [[id number]]          B Medical/Surgery History Past  Medical History:  Diagnosis Date   Anginal pain (HCC)    Arthritis    AV block, 1st degree    CAD (coronary artery disease) 1990; 2015   Cardiac cath 1990 with Dr. Riley Kill and [name] Residual CAD, non obstructive per 2015 cath. STEMI status post stent in August 2015.   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   Promus DES to RCA with STEMI   Shortness of breath    Past Surgical History:  Procedure Laterality Date   CATARACT EXTRACTION  2020   right and left eye    CHOLECYSTECTOMY     thinks her appendix was removed at the same time   CORONARY ANGIOPLASTY WITH STENT PLACEMENT  02/18/14   Promus DES to RCA   LEFT HEART CATHETERIZATION WITH CORONARY ANGIOGRAM N/A 02/18/2014   Procedure: LEFT HEART CATHETERIZATION WITH CORONARY ANGIOGRAM;  Surgeon: Corky Crafts, MD;  Location: [facility] CATH LAB;  Service: Cardiovascular;  Laterality: N/A;   PTCA  1990   PTCA of RCA   TRANSTHORACIC ECHOCARDIOGRAM  02/02/2012   mild LVH, EF 55-60%,  Normal WM, Gr 1 DD; Mild MR     A IV Location/Drains/Wounds Patient Lines/Drains/Airways Status     Active Line/Drains/Airways     Name Placement date Placement time Site Days   Peripheral IV 07/05/23 20 G Right Antecubital 07/05/23  0907  Antecubital  less than 1   Pressure Injury 02/07/23 Sacrum Mid Stage 2 -  Partial thickness loss of dermis presenting as a shallow open injury with a red, pink wound bed without slough. small circular area of broken skin without drainage 02/07/23  1500  -- 148    Pressure Injury 06/16/23 Buttocks Left Stage 2 -  Partial thickness loss of dermis presenting as a shallow open injury with a red, pink wound bed without slough. 06/16/23  0759  -- 19   Wound / Incision (Open or Dehisced) 02/06/23 Skin tear Pretibial Left 02/06/23  0530  Pretibial  149   Wound / Incision (Open or Dehisced) 02/06/23 Laceration Head Upper 02/06/23  0800  Head  149            Intake/Output Last 24 hours  Intake/Output Summary (Last 24 hours) at 07/05/2023 1722 Last data filed at 07/05/2023 1324 Gross per 24 hour  Intake 200 ml  Output 200 ml  Net 0 ml    Labs/Imaging Results for orders placed or performed during the hospital encounter of 07/05/23 (from the past 48 hours)  CBG monitoring, ED     Status: Abnormal   Collection Time: 07/05/23  9:04 AM  Result Value Ref Range   Glucose-Capillary 159 (H) 70 - 99 mg/dL    Comment: Glucose reference range applies only to samples taken after fasting for at least 8 hours.  CBC with Differential     Status: None   Collection Time: 07/05/23  9:08 AM  Result Value Ref Range   WBC 8.9 4.0 - 10.5 K/uL   RBC 4.75 3.87 - 5.11 MIL/uL   Hemoglobin 12.7 12.0 - 15.0 g/dL   HCT 78.2 95.6 - 21.3 %   MCV 86.7 80.0 - 100.0 fL   MCH 26.7 26.0 - 34.0 pg   MCHC 30.8 30.0 - 36.0 g/dL   RDW 08.6 57.8 - 46.9 %   Platelets 360 150 - 400 K/uL   nRBC 0.0 0.0 - 0.2 %   Neutrophils Relative % 78 %   Neutro Abs 6.9 1.7 - 7.7 K/uL   Lymphocytes Relative 16 %   Lymphs Abs 1.5 0.7 - 4.0 K/uL   Monocytes Relative 5 %   Monocytes Absolute 0.5 0.1 - 1.0 K/uL   Eosinophils Relative 0 %   Eosinophils Absolute 0.0 0.0 - 0.5 K/uL   Basophils Relative 0 %   Basophils Absolute 0.0 0.0 - 0.1 K/uL   Immature Granulocytes 1 %   Abs Immature Granulocytes 0.05 0.00 - 0.07 K/uL    Comment: Performed at [facility], [address] [place], [place] [address]  Basic metabolic panel     Status: Abnormal   Collection Time: 07/05/23  9:08 AM  Result  Value Ref Range   Sodium 138 135 - 145 mmol/L   Potassium 4.1 3.5 - 5.1 mmol/L   Chloride 105 98 - 111 mmol/L   CO2 20 (L) 22 - 32 mmol/L   Glucose, Bld 168 (H) 70 - 99 mg/dL    Comment: Glucose reference range applies only to samples taken after fasting for at least 8 hours.   BUN 26 (H) 8 - 23 mg/dL   Creatinine,  Ser 0.84 0.44 - 1.00 mg/dL   Calcium 86.5 8.9 - 78.4 mg/dL   GFR, Estimated >69 >62 mL/min    Comment: (NOTE) Calculated using the CKD-EPI Creatinine Equation (2021)    Anion gap 13 5 - 15    Comment: Performed at [facility], [address], [place], [place] [address]  Hepatic function panel     Status: None   Collection Time: 07/05/23  9:08 AM  Result Value Ref Range   Total Protein 7.4 6.5 - 8.1 g/dL   Albumin 3.6 3.5 - 5.0 g/dL   AST 25 15 - 41 U/L   ALT 18 0 - 44 U/L   Alkaline Phosphatase 58 38 - 126 U/L   Total Bilirubin 0.5 <1.2 mg/dL   Bilirubin, Direct <1.3 0.0 - 0.2 mg/dL   Indirect Bilirubin NOT CALCULATED 0.3 - 0.9 mg/dL    Comment: Performed at [facility], [address] [place], [place] [address]  Urinalysis, Routine w reflex microscopic -Urine, Clean Catch     Status: Abnormal   Collection Time: 07/05/23 10:35 AM  Result Value Ref Range   Color, Urine YELLOW YELLOW   APPearance CLOUDY (A) CLEAR   Specific Gravity, Urine 1.011 1.005 - 1.030   pH 6.0 5.0 - 8.0   Glucose, UA NEGATIVE NEGATIVE mg/dL   Hgb urine dipstick NEGATIVE NEGATIVE   Bilirubin Urine NEGATIVE NEGATIVE   Ketones, ur NEGATIVE NEGATIVE mg/dL   Protein, ur 027 (A) NEGATIVE mg/dL   Nitrite NEGATIVE NEGATIVE   Leukocytes,Ua LARGE (A) NEGATIVE   RBC / HPF 6-10 0 - 5 RBC/hpf   WBC, UA >50 0 - 5 WBC/hpf   Bacteria, UA MANY (A) NONE SEEN   Squamous Epithelial / HPF 0-5 0 - 5 /HPF   WBC Clumps PRESENT     Comment: Performed at [facility], [address], [place], [place] [address]  CBG monitoring, ED     Status: Abnormal   Collection Time: 07/05/23  4:03 PM   Result Value Ref Range   Glucose-Capillary 112 (H) 70 - 99 mg/dL    Comment: Glucose reference range applies only to samples taken after fasting for at least 8 hours.   *Note: Due to a large number of results and/or encounters for the requested time period, some results have not been displayed. A complete set of results can be found in Results Review.   CT ANGIO HEAD NECK W WO CM Result Date: 07/05/2023 CLINICAL DATA:  Subarachnoid hemorrhage EXAM: CT ANGIOGRAPHY HEAD AND NECK WITH AND WITHOUT CONTRAST TECHNIQUE: Multidetector CT imaging of the head and neck was performed using the standard protocol during bolus administration of intravenous contrast. Multiplanar CT image reconstructions and MIPs were obtained to evaluate the vascular anatomy. Carotid stenosis measurements (when applicable) are obtained utilizing NASCET criteria, using the distal internal carotid diameter as the denominator. RADIATION DOSE REDUCTION: This exam was performed according to the departmental dose-optimization program which includes automated exposure control, adjustment of the mA and/or kV according to patient size and/or use of iterative reconstruction technique. CONTRAST:  50mL OMNIPAQUE IOHEXOL 350 MG/ML SOLN COMPARISON:  06/14/2023 CTA head and neck, correlation is also made with 07/05/2023 CT head FINDINGS: CT HEAD FINDINGS For noncontrast findings, please see same day CT head. CTA NECK FINDINGS Evaluation is limited by motion artifact. Aortic arch: Standard branching. Imaged portion shows no evidence of aneurysm or dissection. No significant stenosis of the major arch vessel origins. Aortic atherosclerosis with ulcerated  plaque. Right carotid system: No evidence of dissection, occlusion, or hemodynamically significant stenosis (greater than 50%). Left carotid system: No evidence of dissection, occlusion, or hemodynamically significant stenosis (greater than 50%). Vertebral arteries: No evidence of dissection, occlusion,  or hemodynamically significant stenosis (greater than 50%). Skeleton: No acute osseous abnormality. Degenerative changes in the cervical spine. Other neck: No acute finding. Upper chest: No focal pulmonary opacity or pleural effusion. Apical pleural-parenchymal scarring. Review of the MIP images confirms the above findings CTA HEAD FINDINGS Evaluation is significantly motion degraded. Within this limitation, the intracranial carotid arteries are patent to the termini. The proximal MCAs and ACAs appear patent, although evaluation of the distal vessels is not possible. The vertebral arteries are patent to the vertebrobasilar junction without significant stenosis. The PICAs appear patent proximally. The basilar artery is patent to its distal aspect without significant stenosis. The PCA s are patent proximally. Evaluation of more distal PCAs is not possible. IMPRESSION: 1. Evaluation of the intracranial vasculature is significantly motion degraded. Within this limitation, no large vessel occlusion or significant stenosis in the proximal vessels of the head. 2. Evaluation of the neck vasculature is mildly motion degraded. Within this limitation, no hemodynamically significant stenosis. 3. Aortic atherosclerosis with ulcerated plaque. Aortic Atherosclerosis (ICD10-I70.0). Electronically Signed   By: Wiliam Ke M.D.   On: 07/05/2023 13:21   DG Chest Portable 1 View Result Date: 07/05/2023 CLINICAL DATA:  Altered mental status.  Seizure. EXAM: PORTABLE CHEST 1 VIEW COMPARISON:  06/14/2023. FINDINGS: Low lung volume. There are mildly increased interstitial markings with bilateral hilar predominance, similar to the prior study. No overt pulmonary edema. Bilateral lung fields are otherwise clear. No acute consolidation or lung collapse. Bilateral costophrenic angles are clear. Stable cardio-mediastinal silhouette. No acute osseous abnormalities. The soft tissues are within normal limits. IMPRESSION: *Mild central  vascular congestion. No frank pulmonary edema. No acute consolidation or lung collapse. Electronically Signed   By: Jules Schick M.D.   On: 07/05/2023 11:14   CT HEAD WO CONTRAST ( ) Result Date: 07/05/2023 CLINICAL DATA:  Polytrauma, blunt EXAM: CT HEAD WITHOUT CONTRAST CT CERVICAL SPINE WITHOUT CONTRAST TECHNIQUE: Multidetector CT imaging of the head and cervical spine was performed following the standard protocol without intravenous contrast. Multiplanar CT image reconstructions of the cervical spine were also generated. RADIATION DOSE REDUCTION: This exam was performed according to the departmental dose-optimization program which includes automated exposure control, adjustment of the mA and/or kV according to patient size and/or use of iterative reconstruction technique. COMPARISON:  CT Head and C Spine 03/01/23 FINDINGS: Limitations: Motion degraded exam CT HEAD FINDINGS Brain: Small volume subarachnoid hemorrhage in the right frontal lobe (series 7, image 27). No hydrocephalus. No evidence of intraventricular hemorrhage. No CT evidence of an acute cortical infarct. No mass effect. No mass lesion. Vascular: No hyperdense vessel or unexpected calcification. Skull: Normal. Negative for fracture or focal lesion. Sinuses/Orbits: No middle ear or mastoid effusion. Paranasal sinuses are clear. Orbits are unremarkable. Other: None. CT CERVICAL SPINE FINDINGS Alignment: Grade 1 anterolisthesis of C6 on C7 Skull base and vertebrae: No acute fracture. No primary bone lesion or focal pathologic process. Unchanged likely degenerative fusion of C6-C7 Soft tissues and spinal canal: No prevertebral fluid or swelling. No visible canal hematoma. Disc levels:  No CT evidence of high-grade spinal canal stenosis Upper chest: Negative. Other: 1.2 cm left thyroid nodule. This is not meet size criteria for further imaging workup IMPRESSION: 1. Small volume subarachnoid hemorrhage in the right frontal lobe. 2.  No acute fracture  or traumatic subluxation of the cervical spine. Electronically Signed   By: Lorenza Cambridge M.D.   On: 07/05/2023 10:30   CT Cervical Spine Wo Contrast Result Date: 07/05/2023 CLINICAL DATA:  Polytrauma, blunt EXAM: CT HEAD WITHOUT CONTRAST CT CERVICAL SPINE WITHOUT CONTRAST TECHNIQUE: Multidetector CT imaging of the head and cervical spine was performed following the standard protocol without intravenous contrast. Multiplanar CT image reconstructions of the cervical spine were also generated. RADIATION DOSE REDUCTION: This exam was performed according to the departmental dose-optimization program which includes automated exposure control, adjustment of the mA and/or kV according to patient size and/or use of iterative reconstruction technique. COMPARISON:  CT Head and C Spine 03/01/23 FINDINGS: Limitations: Motion degraded exam CT HEAD FINDINGS Brain: Small volume subarachnoid hemorrhage in the right frontal lobe (series 7, image 27). No hydrocephalus. No evidence of intraventricular hemorrhage. No CT evidence of an acute cortical infarct. No mass effect. No mass lesion. Vascular: No hyperdense vessel or unexpected calcification. Skull: Normal. Negative for fracture or focal lesion. Sinuses/Orbits: No middle ear or mastoid effusion. Paranasal sinuses are clear. Orbits are unremarkable. Other: None. CT CERVICAL SPINE FINDINGS Alignment: Grade 1 anterolisthesis of C6 on C7 Skull base and vertebrae: No acute fracture. No primary bone lesion or focal pathologic process. Unchanged likely degenerative fusion of C6-C7 Soft tissues and spinal canal: No prevertebral fluid or swelling. No visible canal hematoma. Disc levels:  No CT evidence of high-grade spinal canal stenosis Upper chest: Negative. Other: 1.2 cm left thyroid nodule. This is not meet size criteria for further imaging workup IMPRESSION: 1. Small volume subarachnoid hemorrhage in the right frontal lobe. 2. No acute fracture or traumatic subluxation of the  cervical spine. Electronically Signed   By: Lorenza Cambridge M.D.   On: 07/05/2023 10:30    Pending Labs Unresulted Labs (From admission, onward)     Start     Ordered   07/06/23 0500  Comprehensive metabolic panel  Tomorrow morning,   R        07/05/23 1427   07/06/23 0500  CBC  Tomorrow morning,   R        07/05/23 1427   07/05/23 1217  Urine Culture  Once,   URGENT       Question:  Indication  Answer:  Dysuria   07/05/23 1216            Vitals/Pain Today's Vitals   07/05/23 1030 07/05/23 1100 07/05/23 1400 07/05/23 1427  BP: 129/83 (!) 153/69 (!) 159/74   Pulse: 95 94 83   Resp: (!) 27 (!) 24 (!) 24   Temp:    98.2 F (36.8 C)  TempSrc:    Axillary  SpO2: 96% 97% 98%   Weight:      Height:      PainSc:        Isolation Precautions No active isolations  Medications Medications  levETIRAcetam (KEPPRA) IVPB 500 mg/100 mL premix (has no administration in time range)  cefTRIAXone (ROCEPHIN) 1 g in sodium chloride 0.9 % 100 mL IVPB (has no administration in time range)  enalapril (VASOTEC) tablet 20 mg (has no administration in time range)  rosuvastatin (CRESTOR) tablet 40 mg (has no administration in time range)  carvedilol (COREG) tablet 12.5 mg (has no administration in time range)  QUEtiapine (SEROQUEL) tablet 25 mg (has no administration in time range)  medroxyPROGESTERone (PROVERA) tablet 10 mg (0 mg Oral Hold 07/05/23 1612)  loperamide (IMODIUM) capsule 2 mg (has  no administration in time range)  pantoprazole (PROTONIX) EC tablet 40 mg (0 mg Oral Hold 07/05/23 1611)  lamoTRIgine (LAMICTAL) tablet 50 mg (has no administration in time range)  albuterol (PROVENTIL) (2.5 MG/3ML) 0.083% nebulizer solution 2.5 mg (has no administration in time range)  mometasone-formoterol (DULERA) 200-5 MCG/ACT inhaler 2 puff (has no administration in time range)  tiotropium (SPIRIVA) inhalation capsule ([facility] use ONLY) 18 mcg (has no administration in time range)  sodium chloride  flush (NS) 0.9 % injection 3 mL (3 mLs Intravenous Given 07/05/23 1600)  acetaminophen (TYLENOL) tablet 650 mg (has no administration in time range)    Or  acetaminophen (TYLENOL) suppository 650 mg (has no administration in time range)  polyethylene glycol (MIRALAX / GLYCOLAX) packet 17 g (has no administration in time range)  LORazepam (ATIVAN) injection 2 mg (has no administration in time range)  insulin aspart (novoLOG) injection 0-15 Units ( Subcutaneous Not Given 07/05/23 1611)  labetalol (NORMODYNE) injection 5 mg (has no administration in time range)  icosapent Ethyl (VASCEPA) 1 g capsule 1 g (has no administration in time range)    And  icosapent Ethyl (VASCEPA) 1 g capsule 2 g (has no administration in time range)  LORazepam (ATIVAN) injection 2 mg (2 mg Intravenous Given 07/05/23 1006)  levETIRAcetam (KEPPRA) IVPB 1500 mg/ 100 mL premix (0 mg Intravenous Stopped 07/05/23 1157)  cefTRIAXone (ROCEPHIN) 1 g in sodium chloride 0.9 % 100 mL IVPB (0 g Intravenous Stopped 07/05/23 1324)  iohexol (OMNIPAQUE) 350 MG/ML injection 50 mL (50 mLs Intravenous Contrast Given 07/05/23 1252)    Mobility non-ambulatory     Focused Assessments     R Recommendations: See Admitting Provider Note  Report given to:   Additional Notes: Call or message with any questions.

## 2023-07-05 NOTE — Progress Notes (Signed)
LTM maint complete - no skin breakdown seen.  All lead attached. Patient moved from ER to inpt setting . [facility] monitored, Event button test confirmed by [facility].

## 2023-07-05 NOTE — ED Notes (Signed)
Pt's son at bedside

## 2023-07-05 NOTE — ED Triage Notes (Signed)
Pt from home with ems for seizure. Pt found by her son seizing, unknown how long she had been seizing for prior to son finding her but she continued to seize for another 3 mins (full body shaking). Pt has hx of seizures, takes lamictal with a recent increase in her dose. Pt at baseline is alert, oriented and ambulatory with assistance. Pupils equal and reactive. Pt altered, pulling at things and trying to get out of bed. Hypertensive 240/112

## 2023-07-05 NOTE — Telephone Encounter (Signed)
Last Zilretta injections 07/04/23 Can consider repeat injections on or after 09/27/23

## 2023-07-05 NOTE — Progress Notes (Signed)
STAT LTM EEG hooked up and running - no initial skin breakdown - push button tested - No [facility]. Patient in the ED.

## 2023-07-05 NOTE — H&P (Signed)
History and Physical   MAGDLINE MAKI MWU:[id number] DOB: 30-Mar-1933 DOA: 07/05/2023  PCP: Shelva Majestic, MD   Patient coming from: Home  Chief Complaint: Altered mental status, seizure  HPI: Michelle [name] is a 89 y.o. female with medical history significant of dementia, hypertension, hyperlipidemia, diabetes, COPD, CKD 3, CAD status post stent, epilepsy, diastolic CHF, anxiety, anemia, wheelchair-bound presenting with altered mental status and seizure.  Patient has known history of dementia and seizure disorder with recent suspected breakthrough seizures that lead to admissions in October and November of this year.  Today family found her in her bed seizing.  Lasted for couple minutes and seem to resolve spontaneously.  No known fall as patient is largely wheelchair-bound and would have been unable to get herself back into her bed after a fall.  Unable to obtain full/accurate review of systems due to patient's altered mental status and baseline dementia.  ED Course: Vital signs in the ED notable for blood pressure in the 120s 20 systolic, heart rate in the 80s to 110s, respiratory rate in the 20s.  Lab workup included CMP with bicarb 20, BUN 26, glucose 168.  CBC within normal limits.  Urinalysis with protein, leukocytes, bacteria.  Urine culture pending.  Chest x-ray acute mild vascular congestion, CT head showed small volume subarachnoid hemorrhage at the right frontal lobe, CT C-spine showed no acute normality.  CTA of the head and neck was motion degraded for both but showed no large vessel occlusion or significant stenosis of the head nor no hemodynamically significant stenosis of the neck within the motion degraded limitation.  Neurology was consulted and are following.  Patient has received ceftriaxone, Ativan, Keppra in the ED.  Review of Systems: Unable to obtain full/accurate review of systems due to patient's altered mental status and baseline dementia.  Past Medical History:   Diagnosis Date   Anginal pain (HCC)    Arthritis    AV block, 1st degree    CAD (coronary artery disease) 1990; 2015   Cardiac cath 1990 with Dr. Riley Kill and [name] Residual CAD, non obstructive per 2015 cath. STEMI status post stent in August 2015.   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   Promus DES to RCA with STEMI   Shortness of breath     Past Surgical History:  Procedure Laterality Date   CATARACT EXTRACTION  2020   right and left eye    CHOLECYSTECTOMY     thinks her appendix was removed at the same time   CORONARY ANGIOPLASTY WITH STENT PLACEMENT  02/18/14   Promus DES to RCA   LEFT HEART CATHETERIZATION WITH CORONARY ANGIOGRAM N/A 02/18/2014   Procedure: LEFT HEART CATHETERIZATION WITH CORONARY ANGIOGRAM;  Surgeon: Corky Crafts, MD;  Location: [facility] CATH LAB;  Service: Cardiovascular;  Laterality: N/A;   PTCA  1990   PTCA of RCA   TRANSTHORACIC ECHOCARDIOGRAM  02/02/2012   mild LVH, EF 55-60%, Normal WM, Gr 1 DD; Mild MR    Social History  reports  that she quit smoking about 16 years ago. Her smoking use included cigarettes. She started smoking about 71 years ago. She has a 55 pack-year smoking history. She has never used smokeless tobacco. She reports that she does not drink alcohol and does not use drugs.  Allergies  Allergen Reactions   Codeine Sulfate Nausea Only   Morphine Sulfate Nausea Only    Family History  Problem Relation Age of Onset   Heart attack Mother 36   Cancer Father 21   Cancer  Maternal Grandfather    Cancer Paternal Grandmother    Cancer Paternal Grandfather    Diabetes Son    Liver cancer Brother    Bone cancer Brother    Heart attack Son    Hypertension Son    Diabetes Son    Hyperlipidemia Son    Colon cancer Neg Hx    Ovarian cancer Neg Hx    Uterine cancer Neg Hx   Reviewed on admission  Prior to Admission medications   Medication Sig Start Date End Date Taking? Authorizing Provider  acetaminophen (TYLENOL) 650 MG CR tablet Take 1,300 mg by mouth 3 (three) times daily.    [provider]  albuterol (VENTOLIN HFA) 108 (90 Base) MCG/ACT inhaler INHALE 1 PUFF EVERY 4 HOURS AS NEEDED FOR WHEEZING, SHORTNESS OF BREATH (RESCUE INHALER IF ADVAIR NOT WORKING) Patient taking differently: Inhale 2 puffs into the lungs as needed. 03/29/23   Shelva Majestic, MD  amLODipine (NORVASC) 2.5 MG tablet TAKE 1 TABLET EVERY MORNING. IF AFTERNOON BLOOD PRESSURE IS OVER 145/90 TAKE SECOND TABLET. 06/05/23   Shelva Majestic, MD  aspirin 81 MG chewable tablet Chew 81 mg by mouth daily.    [provider]  carvedilol (COREG) 12.5 MG tablet Take 1 tablet (12.5 mg total) by mouth 2 (two) times daily. 12/06/22   Alver Sorrow, NP  cholecalciferol (VITAMIN D3) 25 MCG (1000 UNIT) tablet Take 1,000 Units by mouth daily with breakfast.    [provider]  clopidogrel (PLAVIX) 75 MG tablet TAKE 1 TABLET EVERY DAY Patient taking differently: Take 75 mg by mouth daily. 11/23/22   Shelva Majestic, MD  enalapril (VASOTEC) 20 MG tablet Take 1 tablet (20 mg total) by mouth at bedtime. Patient taking differently: Take 20 mg by mouth at bedtime. 03/23/23   Alver Sorrow, NP  fenofibrate micronized (LOFIBRA) 134 MG capsule TAKE 1 CAPSULE EVERY DAY 12/27/22   Shelva Majestic, MD  ferrous sulfate 325 (65 FE) MG tablet Twice a week Patient taking differently: Take 325 mg by mouth 2 (two) times a week. Tuesday  and Friday 02/21/20   Shelva Majestic, MD   fluticasone-salmeterol (ADVAIR) 250-50 MCG/ACT AEPB INHALE 1 PUFF TWICE DAILY 09/12/22   Shelva Majestic, MD  furosemide (LASIX) 20 MG tablet Take 1 tablet (20 mg total) by mouth as needed for fluid or edema. 03/23/23   Alver Sorrow, NP  hydrALAZINE (APRESOLINE) 25 MG tablet Take 25 mg by mouth 2 (two) times daily. Only use is blood pressure get over >160 Patient not taking: Reported on 06/26/2023    [provider]  icosapent Ethyl (VASCEPA) 1 g capsule Take 2 capsules (2 g total) by mouth 2 (two) times daily. Patient taking differently: Take 1-2 g by mouth See admin instructions. Take 1 gram in the morning and afternoon. Take 2 g at bedtime 12/06/22   Alver Sorrow, NP  lamoTRIgine (LAMICTAL) 25 MG tablet Take 2 tablets (50  mg total) by mouth 2 (two) times daily. 06/18/23   Marinda Elk, MD  loperamide (IMODIUM A-D) 2 MG tablet Take 2-4 mg by mouth 2 (two) times daily as needed for diarrhea or loose stools.    [provider]  loratadine (CLARITIN) 10 MG tablet Take 10 mg by mouth every other day.    [provider]  medroxyPROGESTERone (PROVERA) 10 MG tablet Take 1 tablet (10 mg total) by mouth daily. Patient taking differently: Take 10 mg by mouth at bedtime. 09/12/22   Cross, Efraim Kaufmann D, NP  nitroGLYCERIN (NITROSTAT) 0.4 MG SL tablet DISSOLVE 1 TAB UNDER TONGUE FOR CHEST PAIN - IF PAIN REMAINS AFTER 5 MIN, CALL 911 AND REPEAT DOSE. MAX 3 TABS IN 15 MINUTES Patient taking differently: Place 0.4 mg under the tongue every 5 (five) minutes as needed for chest pain. DISSOLVE 1 TAB UNDER TONGUE FOR CHEST PAIN - IF PAIN REMAINS AFTER 5 MIN, CALL 911 AND REPEAT DOSE. MAX 3 TABS IN 15 MINUTES 01/17/23   Chilton Si, MD  pantoprazole (PROTONIX) 40 MG tablet TAKE 1 TABLET EVERY DAY 05/02/23   Shelva Majestic, MD  QUEtiapine (SEROQUEL) 25 MG tablet Take 1 tablet (25 mg total) by mouth every evening. Between 1830 and 1900 06/21/23   Shelva Majestic, MD   rosuvastatin (CRESTOR) 40 MG tablet Take 1 tablet (40 mg total) by mouth daily. 05/17/23   Shelva Majestic, MD  sertraline (ZOLOFT) 50 MG tablet TAKE 2 TABLETS AT BEDTIME Patient taking differently: Take 100 mg by mouth at bedtime. 10/13/22   Shelva Majestic, MD  SPIRIVA HANDIHALER 18 MCG inhalation capsule PLACE 1 CAPSULE INTO HANDIHALER AND INHALE THE CONTENTS DAILY Patient taking differently: Place 18 mcg into inhaler and inhale daily. 11/23/22   Shelva Majestic, MD  TRUEplus Lancets 33G MISC TEST BLOOD SUGAR UP TO FOUR TIMES DAILY AS DIRECTED 04/17/23   Shelva Majestic, MD    Physical Exam: Vitals:   07/05/23 1011 07/05/23 1030 07/05/23 1100 07/05/23 1400  BP: (!) 171/71 129/83 (!) 153/69 (!) 159/74  Pulse: (!) 105 95 94 83  Resp: 18 (!) 27 (!) 24 (!) 24  Temp:      TempSrc:      SpO2: 99% 96% 97% 98%  Weight:      Height:        Physical Exam Constitutional:      General: She is not in acute distress.    Appearance: She is obese.  HENT:     Head: Normocephalic and atraumatic.     Mouth/Throat:     Mouth: Mucous membranes are moist.     Pharynx: Oropharynx is clear.  Eyes:     Extraocular Movements: Extraocular movements intact.     Pupils: Pupils are equal, round, and reactive to light.  Cardiovascular:     Rate and Rhythm: Normal rate and regular rhythm.     Pulses: Normal pulses.     Heart sounds: Normal heart sounds.  Pulmonary:     Effort: Pulmonary effort is normal. No respiratory distress.     Breath sounds: Normal breath sounds.  Abdominal:     General: Bowel sounds are normal. There is no distension.     Palpations: Abdomen is soft.     Tenderness: There is no abdominal tenderness.  Musculoskeletal:        General: No swelling or deformity.  Skin:    General: Skin is warm and dry.  Neurological:  General: No focal deficit present.     Comments: Drowsy, grimace to pain, maintaining airway otherwise not responsive to voice.    Labs on  Admission: I have personally reviewed following labs and imaging studies  CBC: Recent Labs  Lab 07/05/23 0908  WBC 8.9  NEUTROABS 6.9  HGB 12.7  HCT 41.2  MCV 86.7  PLT 360    Basic Metabolic Panel: Recent Labs  Lab 07/05/23 0908  NA 138  K 4.1  CL 105  CO2 20*  GLUCOSE 168*  BUN 26*  CREATININE 0.84  CALCIUM 10.1    GFR: Estimated Creatinine Clearance: 47.1 mL/min (by C-G formula based on SCr of 0.84 mg/dL).  Liver Function Tests: Recent Labs  Lab 07/05/23 0908  AST 25  ALT 18  ALKPHOS 58  BILITOT 0.5  PROT 7.4  ALBUMIN 3.6    Urine analysis:    Component Value Date/Time   COLORURINE YELLOW 07/05/2023 1035   APPEARANCEUR CLOUDY (A) 07/05/2023 1035   LABSPEC 1.011 07/05/2023 1035   PHURINE 6.0 07/05/2023 1035   GLUCOSEU NEGATIVE 07/05/2023 1035   HGBUR NEGATIVE 07/05/2023 1035   BILIRUBINUR NEGATIVE 07/05/2023 1035   BILIRUBINUR neg 01/14/2021 1535   KETONESUR NEGATIVE 07/05/2023 1035   PROTEINUR 100 (A) 07/05/2023 1035   UROBILINOGEN 0.2 01/14/2021 1535   NITRITE NEGATIVE 07/05/2023 1035   LEUKOCYTESUR LARGE (A) 07/05/2023 1035    Radiological Exams on Admission: CT ANGIO HEAD NECK W WO CM Result Date: 07/05/2023 CLINICAL DATA:  Subarachnoid hemorrhage EXAM: CT ANGIOGRAPHY HEAD AND NECK WITH AND WITHOUT CONTRAST TECHNIQUE: Multidetector CT imaging of the head and neck was performed using the standard protocol during bolus administration of intravenous contrast. Multiplanar CT image reconstructions and MIPs were obtained to evaluate the vascular anatomy. Carotid stenosis measurements (when applicable) are obtained utilizing NASCET criteria, using the distal internal carotid diameter as the denominator. RADIATION DOSE REDUCTION: This exam was performed according to the departmental dose-optimization program which includes automated exposure control, adjustment of the mA and/or kV according to patient size and/or use of iterative reconstruction technique.  CONTRAST:  50mL OMNIPAQUE IOHEXOL 350 MG/ML SOLN COMPARISON:  06/14/2023 CTA head and neck, correlation is also made with 07/05/2023 CT head FINDINGS: CT HEAD FINDINGS For noncontrast findings, please see same day CT head. CTA NECK FINDINGS Evaluation is limited by motion artifact. Aortic arch: Standard branching. Imaged portion shows no evidence of aneurysm or dissection. No significant stenosis of the major arch vessel origins. Aortic atherosclerosis with ulcerated plaque. Right carotid system: No evidence of dissection, occlusion, or hemodynamically significant stenosis (greater than 50%). Left carotid system: No evidence of dissection, occlusion, or hemodynamically significant stenosis (greater than 50%). Vertebral arteries: No evidence of dissection, occlusion, or hemodynamically significant stenosis (greater than 50%). Skeleton: No acute osseous abnormality. Degenerative changes in the cervical spine. Other neck: No acute finding. Upper chest: No focal pulmonary opacity or pleural effusion. Apical pleural-parenchymal scarring. Review of the MIP images confirms the above findings CTA HEAD FINDINGS Evaluation is significantly motion degraded. Within this limitation, the intracranial carotid arteries are patent to the termini. The proximal MCAs and ACAs appear patent, although evaluation of the distal vessels is not possible. The vertebral arteries are patent to the vertebrobasilar junction without significant stenosis. The PICAs appear patent proximally. The basilar artery is patent to its distal aspect without significant stenosis. The PCA s are patent proximally. Evaluation of more distal PCAs is not possible. IMPRESSION: 1. Evaluation of the intracranial vasculature is significantly motion degraded.  Within this limitation, no large vessel occlusion or significant stenosis in the proximal vessels of the head. 2. Evaluation of the neck vasculature is mildly motion degraded. Within this limitation, no  hemodynamically significant stenosis. 3. Aortic atherosclerosis with ulcerated plaque. Aortic Atherosclerosis (ICD10-I70.0). Electronically Signed   By: Wiliam Ke M.D.   On: 07/05/2023 13:21   DG Chest Portable 1 View Result Date: 07/05/2023 CLINICAL DATA:  Altered mental status.  Seizure. EXAM: PORTABLE CHEST 1 VIEW COMPARISON:  06/14/2023. FINDINGS: Low lung volume. There are mildly increased interstitial markings with bilateral hilar predominance, similar to the prior study. No overt pulmonary edema. Bilateral lung fields are otherwise clear. No acute consolidation or lung collapse. Bilateral costophrenic angles are clear. Stable cardio-mediastinal silhouette. No acute osseous abnormalities. The soft tissues are within normal limits. IMPRESSION: *Mild central vascular congestion. No frank pulmonary edema. No acute consolidation or lung collapse. Electronically Signed   By: Jules Schick M.D.   On: 07/05/2023 11:14   CT HEAD WO CONTRAST ( ) Result Date: 07/05/2023 CLINICAL DATA:  Polytrauma, blunt EXAM: CT HEAD WITHOUT CONTRAST CT CERVICAL SPINE WITHOUT CONTRAST TECHNIQUE: Multidetector CT imaging of the head and cervical spine was performed following the standard protocol without intravenous contrast. Multiplanar CT image reconstructions of the cervical spine were also generated. RADIATION DOSE REDUCTION: This exam was performed according to the departmental dose-optimization program which includes automated exposure control, adjustment of the mA and/or kV according to patient size and/or use of iterative reconstruction technique. COMPARISON:  CT Head and C Spine 03/01/23 FINDINGS: Limitations: Motion degraded exam CT HEAD FINDINGS Brain: Small volume subarachnoid hemorrhage in the right frontal lobe (series 7, image 27). No hydrocephalus. No evidence of intraventricular hemorrhage. No CT evidence of an acute cortical infarct. No mass effect. No mass lesion. Vascular: No hyperdense vessel or  unexpected calcification. Skull: Normal. Negative for fracture or focal lesion. Sinuses/Orbits: No middle ear or mastoid effusion. Paranasal sinuses are clear. Orbits are unremarkable. Other: None. CT CERVICAL SPINE FINDINGS Alignment: Grade 1 anterolisthesis of C6 on C7 Skull base and vertebrae: No acute fracture. No primary bone lesion or focal pathologic process. Unchanged likely degenerative fusion of C6-C7 Soft tissues and spinal canal: No prevertebral fluid or swelling. No visible canal hematoma. Disc levels:  No CT evidence of high-grade spinal canal stenosis Upper chest: Negative. Other: 1.2 cm left thyroid nodule. This is not meet size criteria for further imaging workup IMPRESSION: 1. Small volume subarachnoid hemorrhage in the right frontal lobe. 2. No acute fracture or traumatic subluxation of the cervical spine. Electronically Signed   By: Lorenza Cambridge M.D.   On: 07/05/2023 10:30   CT Cervical Spine Wo Contrast Result Date: 07/05/2023 CLINICAL DATA:  Polytrauma, blunt EXAM: CT HEAD WITHOUT CONTRAST CT CERVICAL SPINE WITHOUT CONTRAST TECHNIQUE: Multidetector CT imaging of the head and cervical spine was performed following the standard protocol without intravenous contrast. Multiplanar CT image reconstructions of the cervical spine were also generated. RADIATION DOSE REDUCTION: This exam was performed according to the departmental dose-optimization program which includes automated exposure control, adjustment of the mA and/or kV according to patient size and/or use of iterative reconstruction technique. COMPARISON:  CT Head and C Spine 03/01/23 FINDINGS: Limitations: Motion degraded exam CT HEAD FINDINGS Brain: Small volume subarachnoid hemorrhage in the right frontal lobe (series 7, image 27). No hydrocephalus. No evidence of intraventricular hemorrhage. No CT evidence of an acute cortical infarct. No mass effect. No mass lesion. Vascular: No hyperdense vessel or unexpected calcification. Skull:  Normal. Negative for fracture or focal lesion. Sinuses/Orbits: No middle ear or mastoid effusion. Paranasal sinuses are clear. Orbits are unremarkable. Other: None. CT CERVICAL SPINE FINDINGS Alignment: Grade 1 anterolisthesis of C6 on C7 Skull base and vertebrae: No acute fracture. No primary bone lesion or focal pathologic process. Unchanged likely degenerative fusion of C6-C7 Soft tissues and spinal canal: No prevertebral fluid or swelling. No visible canal hematoma. Disc levels:  No CT evidence of high-grade spinal canal stenosis Upper chest: Negative. Other: 1.2 cm left thyroid nodule. This is not meet size criteria for further imaging workup IMPRESSION: 1. Small volume subarachnoid hemorrhage in the right frontal lobe. 2. No acute fracture or traumatic subluxation of the cervical spine. Electronically Signed   By: Lorenza Cambridge M.D.   On: 07/05/2023 10:30    EKG: Independently reviewed.  Sinus rhythm at 89 bpm.  Minimal baseline wander.  Nonspecific T wave flattening.  Assessment/Plan Active Problems:   Type 2 diabetes mellitus without complication, with long-term current use of insulin (HCC)   Hyperlipidemia associated with type 2 diabetes mellitus (HCC)   Essential hypertension   COPD (chronic obstructive pulmonary disease) (HCC)   Chronic diastolic CHF (congestive heart failure) (HCC)   CAD- RCA PCI '90s, STEMI-RCA DES 02/18/14   Anxiety state   CKD (chronic kidney disease), stage III (HCC)   Anemia, iron deficiency   Dementia (HCC)   Epilepsy (HCC)   DNR (do not resuscitate)/DNI(Do Not Intubate)   Subarachnoid hemorrhage (HCC)   Altered mental status Seizure disorder with breakthrough seizure Subarachnoid hemorrhage > Patient presenting with altered mental status following breakthrough seizure witnessed by family at home.  Additional breakthrough seizure noted in the ED. > Seizures resolved spontaneously at home and with Ativan in the ED.   > No reported fall as patient was found in  bed and would have been unable to get back to her bed after a fall.  But CT head did show small right frontal lobe subarachnoid hemorrhage.  Likely degree of breakthrough seizure. > Recent breakthrough seizures earlier this year. > Neurology consulted and are following. - Monitor on progressive unit overnight - Appreciate neurology recommendations - Continue with Keppra and home Lamictal - Neurology plan for long-term monitoring EEG - Blood pressure goal 120-150 systolic  ?UTI > Unknown if there are symptoms but urinalysis showed protein, leukocytes, bacteria and ceftriaxone was given in the ED.  Urine culture pending. - Will continue ceftriaxone pending urine culture results  Dementia Anxiety - Continue home Seroquel - Confirm if taking sertraline - On Lamictal as above  Hypertension - Continue home carvedilol, enalapril - Is prescribed as needed Lasix, as needed amlodipine, as needed hydralazine  Hyperlipidemia - Continue home rosuvastatin, Vascepa - Confirm if taking fenofibrate  Diabetes - SSI  COPD - Continue Spiriva - Replace home Advair with formulary Dulera - As needed albuterol  CKD 3 > Creatinine stable in the ED  CAD > History of stenting. - Holding aspirin and Plavix in the setting of subarachnoid hemorrhage as above  Chronic diastolic CHF > Last echo was earlier this year with EF 60-65%, indeterminate diastolic function, normal RV function. - Currently prescribed Lasix only as needed.  Largely wheelchair-bound - Noted  DVT prophylaxis: SCDs Code Status:   DNR, family okay with transient intubation if required for seizures.  Otherwise has been DNI in the past. Family Communication:  Updated at bedside  Disposition Plan:   Patient is from:  Home  Anticipated DC to:  Home  Anticipated DC date:  1 to 3 days  Anticipated DC barriers: None  Consults called:  Neurology Admission status:  Observation, progressive  Severity of Illness: The appropriate  patient status for this patient is OBSERVATION. Observation status is judged to be reasonable and necessary in order to provide the required intensity of service to ensure the patient's safety. The patient's presenting symptoms, physical exam findings, and initial radiographic and laboratory data in the context of their medical condition is felt to place them at decreased risk for further clinical deterioration. Furthermore, it is anticipated that the patient will be medically stable for discharge from the hospital within 2 midnights of admission.    Synetta Fail MD [facility]  How to contact the [facility] Attending or Consulting provider 7A - 7P or covering provider during after hours 7P -7A, for this patient?   Check the care team in [facility] and look for a) attending/consulting [facility] provider listed and b) the [facility] team listed Log into [email or web address] and use [facility] universal password to access. If you do not have the password, please contact the hospital operator. Locate the [facility] provider you are looking for under [facility] and page to a number that you can be directly reached. If you still have difficulty reaching the provider, please page the [facility] (Director on Call) for the Hospitalists listed on amion for assistance.  07/05/2023, 2:23 PM

## 2023-07-05 NOTE — ED Notes (Signed)
Patient transported to CT 

## 2023-07-05 NOTE — ED Provider Notes (Signed)
[place] [facility]ERGENCY DEPARTMENT AT [facility] Provider Note   CSN: [id number] Arrival date & time: 07/05/23  4098     History  Chief Complaint  Patient presents with   Seizures   Altered Mental Status    Michelle [name] is a 89 y.o. female.  Patient here with seizures.  History of seizures.  History of dementia.  Family states they found her in bed having seizures for a couple minutes.  Seem to stop on their own.  She is hypertensive with EMS 240/112.  She seemed postictal.  She is on Lamictal.  She has been on the same dose since her recent hospital discharge.  Family states that she was in her normal state of health, no complaints otherwise.  They do not think that she had a fall.  They found her in her bed.  She has not had any recent illness otherwise.  Has not had any complaints.  She does have dementia.  She is confused often but sometimes she does well.  She otherwise can help take care of herself with eating and drinking.  The history is provided by the patient.       Home Medications Prior to Admission medications   Medication Sig Start Date End Date Taking? Authorizing Provider  acetaminophen (TYLENOL) 650 MG CR tablet Take 1,300 mg by mouth 3 (three) times daily.    [provider]  albuterol (VENTOLIN HFA) 108 (90 Base) MCG/ACT inhaler INHALE 1 PUFF EVERY 4 HOURS AS NEEDED FOR WHEEZING, SHORTNESS OF BREATH (RESCUE INHALER IF ADVAIR NOT WORKING) Patient taking differently: Inhale 2 puffs into the lungs as needed. 03/29/23   Shelva Majestic, MD  amLODipine (NORVASC) 2.5 MG tablet TAKE 1 TABLET EVERY MORNING. IF AFTERNOON BLOOD PRESSURE IS OVER 145/90 TAKE SECOND TABLET. 06/05/23   Shelva Majestic, MD  aspirin 81 MG chewable tablet Chew 81 mg by mouth daily.    [provider]  carvedilol (COREG) 12.5 MG tablet Take 1 tablet (12.5 mg total) by mouth 2 (two) times daily. 12/06/22   Alver Sorrow, NP  cholecalciferol (VITAMIN D3) 25 MCG  (1000 UNIT) tablet Take 1,000 Units by mouth daily with breakfast.    [provider]  clopidogrel (PLAVIX) 75 MG tablet TAKE 1 TABLET EVERY DAY Patient taking differently: Take 75 mg by mouth daily. 11/23/22   Shelva Majestic, MD  enalapril (VASOTEC) 20 MG tablet Take 1 tablet (20 mg total) by mouth at bedtime. Patient taking differently: Take 20 mg by mouth at bedtime. 03/23/23   Alver Sorrow, NP  fenofibrate micronized (LOFIBRA) 134 MG capsule TAKE 1 CAPSULE EVERY DAY 12/27/22   Shelva Majestic, MD  ferrous sulfate 325 (65 FE) MG tablet Twice a week Patient taking differently: Take 325 mg by mouth 2 (two) times a week. Tuesday  and Friday 02/21/20   Shelva Majestic, MD  fluticasone-salmeterol (ADVAIR) 250-50 MCG/ACT AEPB INHALE 1 PUFF TWICE DAILY 09/12/22   Shelva Majestic, MD  furosemide (LASIX) 20 MG tablet Take 1 tablet (20 mg total) by mouth as needed for fluid or edema. 03/23/23   Alver Sorrow, NP  hydrALAZINE (APRESOLINE) 25 MG tablet Take 25 mg by mouth 2 (two) times daily. Only use is blood pressure get over >160 Patient not taking: Reported on 06/26/2023    [provider]  icosapent Ethyl (VASCEPA) 1 g capsule Take 2 capsules (2 g total) by mouth 2 (two) times daily. Patient taking differently:  Take 1-2 g by mouth See admin instructions. Take 1 gram in the morning and afternoon. Take 2 g at bedtime 12/06/22   Alver Sorrow, NP  lamoTRIgine (LAMICTAL) 25 MG tablet Take 2 tablets (50 mg total) by mouth 2 (two) times daily. 06/18/23   Marinda Elk, MD  loperamide (IMODIUM A-D) 2 MG tablet Take 2-4 mg by mouth 2 (two) times daily as needed for diarrhea or loose stools.    [provider]  loratadine (CLARITIN) 10 MG tablet Take 10 mg by mouth every other day.    [provider]  medroxyPROGESTERone (PROVERA) 10 MG tablet Take 1 tablet (10 mg total) by mouth daily. Patient taking differently: Take 10 mg by mouth at bedtime. 09/12/22    Cross, Efraim Kaufmann D, NP  nitroGLYCERIN (NITROSTAT) 0.4 MG SL tablet DISSOLVE 1 TAB UNDER TONGUE FOR CHEST PAIN - IF PAIN REMAINS AFTER 5 MIN, CALL 911 AND REPEAT DOSE. MAX 3 TABS IN 15 MINUTES Patient taking differently: Place 0.4 mg under the tongue every 5 (five) minutes as needed for chest pain. DISSOLVE 1 TAB UNDER TONGUE FOR CHEST PAIN - IF PAIN REMAINS AFTER 5 MIN, CALL 911 AND REPEAT DOSE. MAX 3 TABS IN 15 MINUTES 01/17/23   Chilton Si, MD  pantoprazole (PROTONIX) 40 MG tablet TAKE 1 TABLET EVERY DAY 05/02/23   Shelva Majestic, MD  QUEtiapine (SEROQUEL) 25 MG tablet Take 1 tablet (25 mg total) by mouth every evening. Between 1830 and 1900 06/21/23   Shelva Majestic, MD  rosuvastatin (CRESTOR) 40 MG tablet Take 1 tablet (40 mg total) by mouth daily. 05/17/23   Shelva Majestic, MD  sertraline (ZOLOFT) 50 MG tablet TAKE 2 TABLETS AT BEDTIME Patient taking differently: Take 100 mg by mouth at bedtime. 10/13/22   Shelva Majestic, MD  SPIRIVA HANDIHALER 18 MCG inhalation capsule PLACE 1 CAPSULE INTO HANDIHALER AND INHALE THE CONTENTS DAILY Patient taking differently: Place 18 mcg into inhaler and inhale daily. 11/23/22   Shelva Majestic, MD  TRUEplus Lancets 33G MISC TEST BLOOD SUGAR UP TO FOUR TIMES DAILY AS DIRECTED 04/17/23   Shelva Majestic, MD      Allergies    Codeine sulfate and Morphine sulfate    Review of Systems   Review of Systems  Physical Exam Updated Vital Signs BP (!) 153/69   Pulse 94   Temp 98 F (36.7 C) (Oral)   Resp (!) 24   Ht 5\' 5"  (1.651 m)   Wt 75.8 kg   SpO2 97%   BMI 27.81 kg/m  Physical Exam Vitals and nursing note reviewed.  Constitutional:      General: She is not in acute distress.    Appearance: She is well-developed. She is not ill-appearing.  HENT:     Head: Normocephalic and atraumatic.     Nose: Nose normal.     Mouth/Throat:     Mouth: Mucous membranes are moist.  Eyes:     Extraocular Movements: Extraocular movements intact.      Conjunctiva/sclera: Conjunctivae normal.     Pupils: Pupils are equal, round, and reactive to light.  Cardiovascular:     Rate and Rhythm: Normal rate and regular rhythm.     Pulses: Normal pulses.     Heart sounds: Normal heart sounds. No murmur heard. Pulmonary:     Effort: Pulmonary effort is normal. No respiratory distress.     Breath sounds: Normal breath sounds.  Abdominal:     Palpations:  Abdomen is soft.     Tenderness: There is no abdominal tenderness.  Musculoskeletal:        General: No swelling.     Cervical back: Normal range of motion and neck supple.  Skin:    General: Skin is warm and dry.     Capillary Refill: Capillary refill takes less than 2 seconds.  Neurological:     Mental Status: She is alert.     Comments: Patient is encephalopathic but moves all extremities, no active seizures on exam  Psychiatric:        Mood and Affect: Mood normal.     ED Results / Procedures / Treatments   Labs (all labs ordered are listed, but only abnormal results are displayed) Labs Reviewed  BASIC METABOLIC PANEL - Abnormal; Notable for the following components:      Result Value   CO2 20 (*)    Glucose, Bld 168 (*)    BUN 26 (*)    All other components within normal limits  URINALYSIS, ROUTINE W REFLEX MICROSCOPIC - Abnormal; Notable for the following components:   APPearance CLOUDY (*)    Protein, ur 100 (*)    Leukocytes,Ua LARGE (*)    Bacteria, UA MANY (*)    All other components within normal limits  CBG MONITORING, ED - Abnormal; Notable for the following components:   Glucose-Capillary 159 (*)    All other components within normal limits  URINE CULTURE  CBC WITH DIFFERENTIAL/PLATELET  HEPATIC FUNCTION PANEL    EKG EKG Interpretation Date/Time:  Wednesday July 05 2023 11:24:13 EST Ventricular Rate:  89 PR Interval:  229 QRS Duration:  104 QT Interval:  372 QTC Calculation: 453 R Axis:   -22  Text Interpretation: Sinus rhythm Prolonged PR  interval Confirmed by Virgina Norfolk [phone]) on 07/05/2023 11:27:10 AM  Radiology CT ANGIO HEAD NECK W WO CM Result Date: 07/05/2023 CLINICAL DATA:  Subarachnoid hemorrhage EXAM: CT ANGIOGRAPHY HEAD AND NECK WITH AND WITHOUT CONTRAST TECHNIQUE: Multidetector CT imaging of the head and neck was performed using the standard protocol during bolus administration of intravenous contrast. Multiplanar CT image reconstructions and MIPs were obtained to evaluate the vascular anatomy. Carotid stenosis measurements (when applicable) are obtained utilizing NASCET criteria, using the distal internal carotid diameter as the denominator. RADIATION DOSE REDUCTION: This exam was performed according to the departmental dose-optimization program which includes automated exposure control, adjustment of the mA and/or kV according to patient size and/or use of iterative reconstruction technique. CONTRAST:  50mL OMNIPAQUE IOHEXOL 350 MG/ML SOLN COMPARISON:  06/14/2023 CTA head and neck, correlation is also made with 07/05/2023 CT head FINDINGS: CT HEAD FINDINGS For noncontrast findings, please see same day CT head. CTA NECK FINDINGS Evaluation is limited by motion artifact. Aortic arch: Standard branching. Imaged portion shows no evidence of aneurysm or dissection. No significant stenosis of the major arch vessel origins. Aortic atherosclerosis with ulcerated plaque. Right carotid system: No evidence of dissection, occlusion, or hemodynamically significant stenosis (greater than 50%). Left carotid system: No evidence of dissection, occlusion, or hemodynamically significant stenosis (greater than 50%). Vertebral arteries: No evidence of dissection, occlusion, or hemodynamically significant stenosis (greater than 50%). Skeleton: No acute osseous abnormality. Degenerative changes in the cervical spine. Other neck: No acute finding. Upper chest: No focal pulmonary opacity or pleural effusion. Apical pleural-parenchymal scarring. Review  of the MIP images confirms the above findings CTA HEAD FINDINGS Evaluation is significantly motion degraded. Within this limitation, the intracranial carotid arteries are patent to  the termini. The proximal MCAs and ACAs appear patent, although evaluation of the distal vessels is not possible. The vertebral arteries are patent to the vertebrobasilar junction without significant stenosis. The PICAs appear patent proximally. The basilar artery is patent to its distal aspect without significant stenosis. The PCA s are patent proximally. Evaluation of more distal PCAs is not possible. IMPRESSION: 1. Evaluation of the intracranial vasculature is significantly motion degraded. Within this limitation, no large vessel occlusion or significant stenosis in the proximal vessels of the head. 2. Evaluation of the neck vasculature is mildly motion degraded. Within this limitation, no hemodynamically significant stenosis. 3. Aortic atherosclerosis with ulcerated plaque. Aortic Atherosclerosis (ICD10-I70.0). Electronically Signed   By: Wiliam Ke M.D.   On: 07/05/2023 13:21   DG Chest Portable 1 View Result Date: 07/05/2023 CLINICAL DATA:  Altered mental status.  Seizure. EXAM: PORTABLE CHEST 1 VIEW COMPARISON:  06/14/2023. FINDINGS: Low lung volume. There are mildly increased interstitial markings with bilateral hilar predominance, similar to the prior study. No overt pulmonary edema. Bilateral lung fields are otherwise clear. No acute consolidation or lung collapse. Bilateral costophrenic angles are clear. Stable cardio-mediastinal silhouette. No acute osseous abnormalities. The soft tissues are within normal limits. IMPRESSION: *Mild central vascular congestion. No frank pulmonary edema. No acute consolidation or lung collapse. Electronically Signed   By: Jules Schick M.D.   On: 07/05/2023 11:14   CT HEAD WO CONTRAST ( ) Result Date: 07/05/2023 CLINICAL DATA:  Polytrauma, blunt EXAM: CT HEAD WITHOUT CONTRAST CT  CERVICAL SPINE WITHOUT CONTRAST TECHNIQUE: Multidetector CT imaging of the head and cervical spine was performed following the standard protocol without intravenous contrast. Multiplanar CT image reconstructions of the cervical spine were also generated. RADIATION DOSE REDUCTION: This exam was performed according to the departmental dose-optimization program which includes automated exposure control, adjustment of the mA and/or kV according to patient size and/or use of iterative reconstruction technique. COMPARISON:  CT Head and C Spine 03/01/23 FINDINGS: Limitations: Motion degraded exam CT HEAD FINDINGS Brain: Small volume subarachnoid hemorrhage in the right frontal lobe (series 7, image 27). No hydrocephalus. No evidence of intraventricular hemorrhage. No CT evidence of an acute cortical infarct. No mass effect. No mass lesion. Vascular: No hyperdense vessel or unexpected calcification. Skull: Normal. Negative for fracture or focal lesion. Sinuses/Orbits: No middle ear or mastoid effusion. Paranasal sinuses are clear. Orbits are unremarkable. Other: None. CT CERVICAL SPINE FINDINGS Alignment: Grade 1 anterolisthesis of C6 on C7 Skull base and vertebrae: No acute fracture. No primary bone lesion or focal pathologic process. Unchanged likely degenerative fusion of C6-C7 Soft tissues and spinal canal: No prevertebral fluid or swelling. No visible canal hematoma. Disc levels:  No CT evidence of high-grade spinal canal stenosis Upper chest: Negative. Other: 1.2 cm left thyroid nodule. This is not meet size criteria for further imaging workup IMPRESSION: 1. Small volume subarachnoid hemorrhage in the right frontal lobe. 2. No acute fracture or traumatic subluxation of the cervical spine. Electronically Signed   By: Lorenza Cambridge M.D.   On: 07/05/2023 10:30   CT Cervical Spine Wo Contrast Result Date: 07/05/2023 CLINICAL DATA:  Polytrauma, blunt EXAM: CT HEAD WITHOUT CONTRAST CT CERVICAL SPINE WITHOUT CONTRAST  TECHNIQUE: Multidetector CT imaging of the head and cervical spine was performed following the standard protocol without intravenous contrast. Multiplanar CT image reconstructions of the cervical spine were also generated. RADIATION DOSE REDUCTION: This exam was performed according to the departmental dose-optimization program which includes automated exposure control, adjustment of the  mA and/or kV according to patient size and/or use of iterative reconstruction technique. COMPARISON:  CT Head and C Spine 03/01/23 FINDINGS: Limitations: Motion degraded exam CT HEAD FINDINGS Brain: Small volume subarachnoid hemorrhage in the right frontal lobe (series 7, image 27). No hydrocephalus. No evidence of intraventricular hemorrhage. No CT evidence of an acute cortical infarct. No mass effect. No mass lesion. Vascular: No hyperdense vessel or unexpected calcification. Skull: Normal. Negative for fracture or focal lesion. Sinuses/Orbits: No middle ear or mastoid effusion. Paranasal sinuses are clear. Orbits are unremarkable. Other: None. CT CERVICAL SPINE FINDINGS Alignment: Grade 1 anterolisthesis of C6 on C7 Skull base and vertebrae: No acute fracture. No primary bone lesion or focal pathologic process. Unchanged likely degenerative fusion of C6-C7 Soft tissues and spinal canal: No prevertebral fluid or swelling. No visible canal hematoma. Disc levels:  No CT evidence of high-grade spinal canal stenosis Upper chest: Negative. Other: 1.2 cm left thyroid nodule. This is not meet size criteria for further imaging workup IMPRESSION: 1. Small volume subarachnoid hemorrhage in the right frontal lobe. 2. No acute fracture or traumatic subluxation of the cervical spine. Electronically Signed   By: Lorenza Cambridge M.D.   On: 07/05/2023 10:30    Procedures .Critical Care  Performed by: Virgina Norfolk, DO Authorized by: Virgina Norfolk, DO   Critical care provider statement:    Critical care time (minutes):  40   Critical care  was necessary to treat or prevent imminent or life-threatening deterioration of the following conditions:  CNS failure or compromise   Critical care was time spent personally by me on the following activities:  Blood draw for specimens, development of treatment plan with patient or surrogate, discussions with consultants, discussions with primary provider, evaluation of patient's response to treatment, examination of patient, obtaining history from patient or surrogate, ordering and performing treatments and interventions, ordering and review of laboratory studies, pulse oximetry, ordering and review of radiographic studies, re-evaluation of patient's condition and review of old charts   Care discussed with: admitting provider       Medications Ordered in ED Medications  LORazepam (ATIVAN) 2 MG/ML injection (  Not Given 07/05/23 1025)  levETIRAcetam (KEPPRA) IVPB 500 mg/100 mL premix (has no administration in time range)  LORazepam (ATIVAN) injection 2 mg (2 mg Intravenous Given 07/05/23 1006)  levETIRAcetam (KEPPRA) IVPB 1500 mg/ 100 mL premix (0 mg Intravenous Stopped 07/05/23 1157)  cefTRIAXone (ROCEPHIN) 1 g in sodium chloride 0.9 % 100 mL IVPB (0 g Intravenous Stopped 07/05/23 1324)  iohexol (OMNIPAQUE) 350 MG/ML injection 50 mL (50 mLs Intravenous Contrast Given 07/05/23 1252)    ED Course/ Medical Decision Making/ A&P                                 Medical Decision Making Amount and/or Complexity of Data Reviewed Labs: ordered. Radiology: ordered.  Risk Prescription drug management. Decision regarding hospitalization.   Michelle [name] is here with seizures.  Normal vitals except for hypertensive to 186/103.  No fever.  Patient seems postictal.  Moving all extremities.  Family states they found her in bed seizing.  She uses a wheelchair for the most part.  She cannot walk on her own.  There is no signs of head trauma on exam.  If she had fallen there is no way she would have  fallen on the floor been able to get herself back in bed so they are  pretty confident that there is been no trauma or falls.  There is no sign of trauma on exam either.  She has been on the same dose of Lamictal since her last hospital discharge.  She has a history of seizures, dementia, COPD, CAD.  She has been in her normal state of health otherwise.  Patient lives with her son.  Patient during my discussion with family ended up having a seizure that lasted several minutes but resolved with Ativan 2 mg.  She was hypoxic during this event but she was given supplemental oxygen with good improvement.  Vital signs improved after seizure.  She is not sedated with Ativan.  Labs and head CT were ordered.  Infectious workup initiated as well as metabolic workup.  Suspect differential diagnosis likely breakthrough seizures in the setting of may be poor sleep or dementia but could be from infectious process or dehydration.  EKG shows sinus rhythm.  No ischemic changes.  Overall lab work for my review and interpretation shows no significant leukocytosis or anemia.  No significant electrolyte abnormality or kidney injury.  Radiology called me as there is small volume subarachnoid hemorrhage in the right frontal lobe on CT scan.  CT of the next unremarkable.  I talked with Dr. Selina Cooley with neurology.  Patient was already loaded with IV Keppra which she agrees with.  Now we will get a CTA to further evaluate head bleed.  Overall plan to admit after that.  Seizures have been controlled thus far with Ativan and Keppra.  She is maintaining her own airway.  Family states she is DNR but would allow for a short amount of intubation if needed to help with seizures.  Urinalysis also looks to be consistent with infection.  Will send urine culture and give a dose of Rocephin.  CTAs unremarkable.  Patient still slightly sedated but seems to be improving.  Will admit her for further care.  Breakthrough seizures in the setting of small  subarachnoid hemorrhage.  Blood pressure has been stable 150/69 currently.  Patient to be admitted for further care.  This chart was dictated using voice recognition software.  Despite best efforts to proofread,  errors can occur which can change the documentation meaning.         Final Clinical Impression(s) / ED Diagnoses Final diagnoses:  SAH (subarachnoid hemorrhage) (HCC)  Seizures (HCC)  Acute cystitis without hematuria    Rx / DC Orders ED Discharge Orders     None         Virgina Norfolk, DO 07/05/23 1326

## 2023-07-05 NOTE — ED Notes (Signed)
Pt started seizing, last about 4-5 mins, EDP at bedside, pt ventilations assisted with BVM briefly. Pt transitioned to [place] 3L

## 2023-07-05 NOTE — Consult Note (Addendum)
NEUROLOGY CONSULT NOTE   Date of service: July 05, 2023 Patient Name: Michelle [name] MRN:  [id number] DOB:  1933-07-11 Chief Complaint: "Seizure" Requesting Provider: Virgina Norfolk, DO  History of Present Illness  [name] is a 89 y.o. 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   Patient presents today after having a seizure at home.  Her family saw her in bed with jerking movements of bilateral upper extremities which lasted for several minutes.  Family administered intranasal rescue medication (likely diazepam), and seizure stopped.  She was then observed by RN to have another seizure around 10:00 in the ED today.  Lorazepam was administered, and seizure stopped.  She takes Lamictal at home and did not miss any doses per family.  On head CT, small volume subarachnoid hemorrhage was noted in right frontal lobe.  This is likely not traumatic as patient did not have a fall and remained in bed during her seizure activity this morning.  Will obtain CT angiogram to determine if this is an aneurysmal subarachnoid hemorrhage.   ROS   Unable to ascertain due to altered mental status  Past History   Past Medical History:  Diagnosis Date   Anginal pain (HCC)    Arthritis    AV block, 1st degree    CAD (coronary artery disease) 1990; 2015   Cardiac cath 1990 with Dr. Riley Kill and pt reports blockage in artery  with angioplasty. She has pictures that show severe stenosis mid RCA and a post PTCA picture with 30% residual stenosis post  PTCA. Residual CAD, non obstructive per 2015 cath. STEMI status post stent in August 2015.   COPD (chronic obstructive pulmonary disease) (HCC)    Diabetes mellitus type 2, insulin dependent (HCC)    Essential hypertension    Hyperlipidemia with target LDL less than 70    Myocardial infarction [facility])    Pericarditis-post MI (short course of steroids) 03/06/2014   S/P coronary artery stent placement 02/18/14, DES -RCA to cover RCA aneurysm 02/18/14   Promus DES to RCA with STEMI   Shortness of breath     Past Surgical History:  Procedure Laterality Date   CATARACT EXTRACTION  2020   right and left eye    CHOLECYSTECTOMY     thinks her appendix was removed at the same time   CORONARY ANGIOPLASTY WITH STENT PLACEMENT  02/18/14   Promus DES to RCA   LEFT HEART CATHETERIZATION WITH CORONARY ANGIOGRAM N/A 02/18/2014   Procedure: LEFT HEART CATHETERIZATION WITH CORONARY ANGIOGRAM;  Surgeon: Corky Crafts, MD;  Location: [facility];  Service: Cardiovascular;  Laterality: N/A;   PTCA  1990   PTCA of RCA   TRANSTHORACIC ECHOCARDIOGRAM  02/02/2012   mild LVH, EF 55-60%, Normal WM, Gr 1 DD; Mild MR    Family History: Family History  Problem Relation Age of Onset   Heart attack Mother 78   Cancer Father 58   Cancer Maternal Grandfather    Cancer Paternal Grandmother    Cancer Paternal Grandfather    Diabetes Son    Liver cancer Brother    Bone cancer Brother  Heart attack Son    Hypertension Son    Diabetes Son    Hyperlipidemia Son    Colon cancer Neg Hx    Ovarian cancer Neg Hx    Uterine cancer Neg Hx     Social History  reports that she quit smoking about 16 years ago. Her smoking use included cigarettes. She started smoking about 71 years ago. She has a 55 pack-year smoking history. She has never used smokeless tobacco. She reports that she does not drink alcohol and does not use drugs.  Allergies  Allergen Reactions   Codeine Sulfate Nausea Only   Morphine Sulfate Nausea  Only    Medications   Current Facility-Administered Medications:    cefTRIAXone (ROCEPHIN) 1 g in sodium chloride 0.9 % 100 mL IVPB, 1 g, Intravenous, Once, Curatolo, Adam, DO   levETIRAcetam (KEPPRA) IVPB 500 mg/100 mL premix, 500 mg, Intravenous, Q12H, de La Torre, Cortney E, NP   LORazepam (ATIVAN) 2 MG/ML injection, , , ,   Current Outpatient Medications:    acetaminophen (TYLENOL) 650 MG CR tablet, Take 1,300 mg by mouth 3 (three) times daily., Disp: , Rfl:    albuterol (VENTOLIN HFA) 108 (90 Base) MCG/ACT inhaler, INHALE 1 PUFF EVERY 4 HOURS AS NEEDED FOR WHEEZING, SHORTNESS OF BREATH (RESCUE INHALER IF ADVAIR NOT WORKING) (Patient taking differently: Inhale 2 puffs into the lungs as needed.), Disp: 2 each, Rfl: 2   amLODipine (NORVASC) 2.5 MG tablet, TAKE 1 TABLET EVERY MORNING. IF AFTERNOON BLOOD PRESSURE IS OVER 145/90 TAKE SECOND TABLET., Disp: 180 tablet, Rfl: 3   aspirin 81 MG chewable tablet, Chew 81 mg by mouth daily., Disp: , Rfl:    carvedilol (COREG) 12.5 MG tablet, Take 1 tablet (12.5 mg total) by mouth 2 (two) times daily., Disp: 180 tablet, Rfl: 3   cholecalciferol (VITAMIN D3) 25 MCG (1000 UNIT) tablet, Take 1,000 Units by mouth daily with breakfast., Disp: , Rfl:    clopidogrel (PLAVIX) 75 MG tablet, TAKE 1 TABLET EVERY DAY (Patient taking differently: Take 75 mg by mouth daily.), Disp: 90 tablet, Rfl: 3   enalapril (VASOTEC) 20 MG tablet, Take 1 tablet (20 mg total) by mouth at bedtime. (Patient taking differently: Take 20 mg by mouth at bedtime.), Disp: 90 tablet, Rfl: 3   fenofibrate micronized (LOFIBRA) 134 MG capsule, TAKE 1 CAPSULE EVERY DAY, Disp: 90 capsule, Rfl: 3   ferrous sulfate 325 (65 FE) MG tablet, Twice a week (Patient taking differently: Take 325 mg by mouth 2 (two) times a week. Tuesday  and Friday), Disp: 18 tablet, Rfl: 3   fluticasone-salmeterol (ADVAIR) 250-50 MCG/ACT AEPB, INHALE 1 PUFF TWICE DAILY, Disp: 180 each, Rfl: 3   furosemide (LASIX) 20 MG  tablet, Take 1 tablet (20 mg total) by mouth as needed for fluid or edema., Disp: 90 tablet, Rfl: 2   hydrALAZINE (APRESOLINE) 25 MG tablet, Take 25 mg by mouth 2 (two) times daily. Only use is blood pressure get over >160 (Patient not taking: Reported on 06/26/2023), Disp: , Rfl:    icosapent Ethyl (VASCEPA) 1 g capsule, Take 2 capsules (2 g total) by mouth 2 (two) times daily. (Patient taking differently: Take 1-2 g by mouth See admin instructions. Take 1 gram in the morning and afternoon. Take 2 g at bedtime), Disp: 360 capsule, Rfl: 3   lamoTRIgine (LAMICTAL) 25 MG tablet, Take 2 tablets (50 mg total) by mouth 2 (two) times daily., Disp: 60 tablet, Rfl: 1   loperamide (IMODIUM A-D) 2  MG tablet, Take 2-4 mg by mouth 2 (two) times daily as needed for diarrhea or loose stools., Disp: , Rfl:    loratadine (CLARITIN) 10 MG tablet, Take 10 mg by mouth every other day., Disp: , Rfl:    medroxyPROGESTERone (PROVERA) 10 MG tablet, Take 1 tablet (10 mg total) by mouth daily. (Patient taking differently: Take 10 mg by mouth at bedtime.), Disp: 30 tablet, Rfl: 3   nitroGLYCERIN (NITROSTAT) 0.4 MG SL tablet, DISSOLVE 1 TAB UNDER TONGUE FOR CHEST PAIN - IF PAIN REMAINS AFTER 5 MIN, CALL 911 AND REPEAT DOSE. MAX 3 TABS IN 15 MINUTES (Patient taking differently: Place 0.4 mg under the tongue every 5 (five) minutes as needed for chest pain. DISSOLVE 1 TAB UNDER TONGUE FOR CHEST PAIN - IF PAIN REMAINS AFTER 5 MIN, CALL 911 AND REPEAT DOSE. MAX 3 TABS IN 15 MINUTES), Disp: 25 tablet, Rfl: 6   pantoprazole (PROTONIX) 40 MG tablet, TAKE 1 TABLET EVERY DAY, Disp: 90 tablet, Rfl: 3   QUEtiapine (SEROQUEL) 25 MG tablet, Take 1 tablet (25 mg total) by mouth every evening. Between 1830 and 1900, Disp: 90 tablet, Rfl: 3   rosuvastatin (CRESTOR) 40 MG tablet, Take 1 tablet (40 mg total) by mouth daily., Disp: 90 tablet, Rfl: 3   sertraline (ZOLOFT) 50 MG tablet, TAKE 2 TABLETS AT BEDTIME (Patient taking differently: Take 100 mg  by mouth at bedtime.), Disp: 180 tablet, Rfl: 3   SPIRIVA HANDIHALER 18 MCG inhalation capsule, PLACE 1 CAPSULE INTO HANDIHALER AND INHALE THE CONTENTS DAILY (Patient taking differently: Place 18 mcg into inhaler and inhale daily.), Disp: 90 capsule, Rfl: 3   TRUEplus Lancets 33G MISC, TEST BLOOD SUGAR UP TO FOUR TIMES DAILY AS DIRECTED, Disp: 400 each, Rfl: 3  Vitals   Vitals:   July 06, 2023 1010 07/06/2023 1011 2023-07-06 1030 07-06-23 1100  BP:  (!) 171/71 129/83 (!) 153/69  Pulse: (!) 108 (!) 105 95 94  Resp: (!) 23 18 (!) 27 (!) 24  Temp:      TempSrc:      SpO2: 99% 99% 96% 97%  Weight:      Height:        Body mass index is 27.81 kg/m.  Physical Exam   Constitutional: Frail, chronically ill-appearing elderly patient in no acute distress Eyes: No scleral injection.  HENT: No OP obstruction.  Head: Normocephalic.  Cardiovascular: Normal rate and regular rhythm.  Respiratory: Effort normal, non-labored breathing on supplemental O2 Skin: WDI.   Neurologic Examination   Patient rests with eyes closed and does not respond to voice or follow commands.  Pupils are equal round and reactive to light.  She does not focus or track examiner.  She is able to withdraw all 4 extremities to noxious stimuli but exhibits no purposeful movement.  Labs/Imaging/Neurodiagnostic studies   CBC:  Recent Labs  Lab 07-06-2023 0908  WBC 8.9  NEUTROABS 6.9  HGB 12.7  HCT 41.2  MCV 86.7  PLT 360   Basic Metabolic Panel:  Lab Results  Component Value Date   NA 138 07/06/2023   K 4.1 07/06/23   CO2 20 (L) 2023-07-06   GLUCOSE 168 (H) 07/06/23   BUN 26 (H) 2023/07/06   CREATININE 0.84 06-Jul-2023   CALCIUM 10.1 2023-07-06   GFRNONAA >60 07-06-23   GFRAA 77 07/01/2020   Lipid Panel:  Lab Results  Component Value Date   LDLCALC 32 08/19/2022   HgbA1c:  Lab Results  Component Value Date   HGBA1C  6.4 06/12/2023   Urine Drug Screen:     Component Value Date/Time   LABOPIA NONE  DETECTED 06/14/2023 1545   COCAINSCRNUR NONE DETECTED 06/14/2023 1545   LABBENZ NONE DETECTED 06/14/2023 1545   AMPHETMU NONE DETECTED 06/14/2023 1545   THCU NONE DETECTED 06/14/2023 1545   LABBARB NONE DETECTED 06/14/2023 1545    Alcohol Level     Component Value Date/Time   ETH <10 06/14/2023 1630   INR  Lab Results  Component Value Date   INR 1.2 06/15/2023   APTT  Lab Results  Component Value Date   APTT 29 06/15/2023   AED levels:  Lab Results  Component Value Date   LAMOTRIGINE <1.0 (L) 05/17/2023    CT Head without contrast(Personally reviewed): Small volume subarachnoid hemorrhage in the right frontal lobe  CT angio Head and Neck with contrast(Personally reviewed): Motion limited study with no LVO or significant stenosis in the proximal vessels, no aneurysm seen, but again study is motion limited   ASSESSMENT   Michelle [name] is a 89 y.o. 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  Patient presents today after having seizure activity at home witnessed by her family.  The seizure activity consisted of bilateral upper extremity jerking movements.  Family administered intranasal rescue medication, and seizure stopped.  She did have another seizure witnessed by nursing while in the ED and was given Ativan and loaded with Keppra.  CT head revealed small volume subarachnoid hemorrhage in the right frontal lobe, and CTA demonstrates no aneurysm.  It is most likely not traumatic as patient remained in bed during seizure  activity this morning and did not have a fall.  Given multiple seizures in patient with known seizure disorder and decreased responsiveness after seizure activity ceased, will place on long-term EEG to evaluate for electrographic status.  RECOMMENDATIONS   - LTM EEG - Loaded with Keppra 1500 mg, continue Keppra 500 mg twice daily -Continue seizure precautions - SBP 130-150 - No anticoagulation, pharmacologic DVT prophylaxis, or antiplatelets in setting of SAH - Neurochecks q 4 hrs ______________________________________________________________________    Signed, Cortney Harland Dingwall, NP [facility]    Attending Neurohospitalist Addendum Patient seen and examined with APP/Resident. Agree with the history and physical as documented above. Agree with the plan as documented, which I helped formulate. I have edited the note above to reflect my full findings and recommendations. I have independently reviewed the chart, obtained history, review of systems and examined the patient.I have personally reviewed pertinent head/neck/spine imaging (CT/MRI). Please feel free to call with any questions.  SAH is small, does not appear aneurysmal, most likely 2/2 underlying amyloid angiopathy. BP stable within goal without intervention. OK to admit to progressive unit. Avoid anticoagulation, pharmacologic DVT prophylaxis, or antiplatelets. Will transition her to keppra IV while unable to take po and place on LTM EEG to r/o ongoing nonconvulsive seizures. Repeat CTA head 2/2 motion degradation.  -- Bing Neighbors, MD [facility] [phone]  If 7pm- 7am, please page neurology on call as listed in [facility].

## 2023-07-06 DIAGNOSIS — G40301 Generalized idiopathic epilepsy and epileptic syndromes, not intractable, with status epilepticus: Secondary | ICD-10-CM | POA: Diagnosis not present

## 2023-07-06 DIAGNOSIS — I6900 Unspecified sequelae of nontraumatic subarachnoid hemorrhage: Secondary | ICD-10-CM | POA: Diagnosis not present

## 2023-07-06 DIAGNOSIS — I609 Nontraumatic subarachnoid hemorrhage, unspecified: Secondary | ICD-10-CM | POA: Diagnosis present

## 2023-07-06 DIAGNOSIS — F0393 Unspecified dementia, unspecified severity, with mood disturbance: Secondary | ICD-10-CM | POA: Diagnosis present

## 2023-07-06 DIAGNOSIS — I252 Old myocardial infarction: Secondary | ICD-10-CM | POA: Diagnosis not present

## 2023-07-06 DIAGNOSIS — E854 Organ-limited amyloidosis: Secondary | ICD-10-CM | POA: Diagnosis present

## 2023-07-06 DIAGNOSIS — E1122 Type 2 diabetes mellitus with diabetic chronic kidney disease: Secondary | ICD-10-CM | POA: Diagnosis present

## 2023-07-06 DIAGNOSIS — B961 Klebsiella pneumoniae [K. pneumoniae] as the cause of diseases classified elsewhere: Secondary | ICD-10-CM | POA: Diagnosis present

## 2023-07-06 DIAGNOSIS — Z7401 Bed confinement status: Secondary | ICD-10-CM | POA: Diagnosis not present

## 2023-07-06 DIAGNOSIS — I5032 Chronic diastolic (congestive) heart failure: Secondary | ICD-10-CM | POA: Diagnosis present

## 2023-07-06 DIAGNOSIS — T426X5A Adverse effect of other antiepileptic and sedative-hypnotic drugs, initial encounter: Secondary | ICD-10-CM | POA: Diagnosis not present

## 2023-07-06 DIAGNOSIS — R262 Difficulty in walking, not elsewhere classified: Secondary | ICD-10-CM | POA: Diagnosis present

## 2023-07-06 DIAGNOSIS — F0150 Vascular dementia without behavioral disturbance: Secondary | ICD-10-CM | POA: Diagnosis not present

## 2023-07-06 DIAGNOSIS — E119 Type 2 diabetes mellitus without complications: Secondary | ICD-10-CM | POA: Diagnosis not present

## 2023-07-06 DIAGNOSIS — F411 Generalized anxiety disorder: Secondary | ICD-10-CM | POA: Diagnosis present

## 2023-07-06 DIAGNOSIS — Z66 Do not resuscitate: Secondary | ICD-10-CM | POA: Diagnosis present

## 2023-07-06 DIAGNOSIS — Z993 Dependence on wheelchair: Secondary | ICD-10-CM | POA: Diagnosis not present

## 2023-07-06 DIAGNOSIS — G40909 Epilepsy, unspecified, not intractable, without status epilepticus: Secondary | ICD-10-CM | POA: Diagnosis present

## 2023-07-06 DIAGNOSIS — M6281 Muscle weakness (generalized): Secondary | ICD-10-CM | POA: Diagnosis not present

## 2023-07-06 DIAGNOSIS — Z794 Long term (current) use of insulin: Secondary | ICD-10-CM | POA: Diagnosis not present

## 2023-07-06 DIAGNOSIS — B9689 Other specified bacterial agents as the cause of diseases classified elsewhere: Secondary | ICD-10-CM | POA: Diagnosis not present

## 2023-07-06 DIAGNOSIS — R41841 Cognitive communication deficit: Secondary | ICD-10-CM | POA: Diagnosis not present

## 2023-07-06 DIAGNOSIS — I2510 Atherosclerotic heart disease of native coronary artery without angina pectoris: Secondary | ICD-10-CM | POA: Diagnosis present

## 2023-07-06 DIAGNOSIS — Z515 Encounter for palliative care: Secondary | ICD-10-CM | POA: Diagnosis not present

## 2023-07-06 DIAGNOSIS — D509 Iron deficiency anemia, unspecified: Secondary | ICD-10-CM | POA: Diagnosis present

## 2023-07-06 DIAGNOSIS — I10 Essential (primary) hypertension: Secondary | ICD-10-CM | POA: Diagnosis not present

## 2023-07-06 DIAGNOSIS — E669 Obesity, unspecified: Secondary | ICD-10-CM | POA: Diagnosis present

## 2023-07-06 DIAGNOSIS — J449 Chronic obstructive pulmonary disease, unspecified: Secondary | ICD-10-CM | POA: Diagnosis present

## 2023-07-06 DIAGNOSIS — N390 Urinary tract infection, site not specified: Secondary | ICD-10-CM | POA: Diagnosis present

## 2023-07-06 DIAGNOSIS — Z955 Presence of coronary angioplasty implant and graft: Secondary | ICD-10-CM | POA: Diagnosis not present

## 2023-07-06 DIAGNOSIS — E785 Hyperlipidemia, unspecified: Secondary | ICD-10-CM | POA: Diagnosis present

## 2023-07-06 DIAGNOSIS — I110 Hypertensive heart disease with heart failure: Secondary | ICD-10-CM | POA: Diagnosis present

## 2023-07-06 DIAGNOSIS — R2689 Other abnormalities of gait and mobility: Secondary | ICD-10-CM | POA: Diagnosis not present

## 2023-07-06 DIAGNOSIS — G40919 Epilepsy, unspecified, intractable, without status epilepticus: Secondary | ICD-10-CM | POA: Diagnosis not present

## 2023-07-06 DIAGNOSIS — R278 Other lack of coordination: Secondary | ICD-10-CM | POA: Diagnosis not present

## 2023-07-06 DIAGNOSIS — R000 Tachycardia, unspecified: Secondary | ICD-10-CM | POA: Diagnosis not present

## 2023-07-06 DIAGNOSIS — R569 Unspecified convulsions: Secondary | ICD-10-CM | POA: Diagnosis not present

## 2023-07-06 DIAGNOSIS — F0394 Unspecified dementia, unspecified severity, with anxiety: Secondary | ICD-10-CM | POA: Diagnosis present

## 2023-07-06 DIAGNOSIS — F32A Depression, unspecified: Secondary | ICD-10-CM | POA: Diagnosis present

## 2023-07-06 DIAGNOSIS — Z7189 Other specified counseling: Secondary | ICD-10-CM | POA: Diagnosis not present

## 2023-07-06 DIAGNOSIS — G928 Other toxic encephalopathy: Secondary | ICD-10-CM | POA: Diagnosis present

## 2023-07-06 LAB — CBC
HCT: 36.3 % (ref 36.0–46.0)
Hemoglobin: 11.5 g/dL — ABNORMAL LOW (ref 12.0–15.0)
MCH: 27.1 pg (ref 26.0–34.0)
MCHC: 31.7 g/dL (ref 30.0–36.0)
MCV: 85.4 fL (ref 80.0–100.0)
Platelets: 338 10*3/uL (ref 150–400)
RBC: 4.25 MIL/uL (ref 3.87–5.11)
RDW: 15.4 % (ref 11.5–15.5)
WBC: 10 10*3/uL (ref 4.0–10.5)
nRBC: 0 % (ref 0.0–0.2)

## 2023-07-06 LAB — COMPREHENSIVE METABOLIC PANEL
ALT: 16 U/L (ref 0–44)
AST: 20 U/L (ref 15–41)
Albumin: 3.1 g/dL — ABNORMAL LOW (ref 3.5–5.0)
Alkaline Phosphatase: 48 U/L (ref 38–126)
Anion gap: 6 (ref 5–15)
BUN: 21 mg/dL (ref 8–23)
CO2: 23 mmol/L (ref 22–32)
Calcium: 9.2 mg/dL (ref 8.9–10.3)
Chloride: 109 mmol/L (ref 98–111)
Creatinine, Ser: 0.75 mg/dL (ref 0.44–1.00)
GFR, Estimated: >60 mL/min (ref >60)
Glucose, Bld: 91 mg/dL (ref 70–99)
Potassium: 3.6 mmol/L (ref 3.5–5.1)
Sodium: 138 mmol/L (ref 135–145)
Total Bilirubin: 0.5 mg/dL (ref <1.2)
Total Protein: 6.7 g/dL (ref 6.5–8.1)

## 2023-07-06 LAB — GLUCOSE, CAPILLARY
Glucose-Capillary: 105 mg/dL — ABNORMAL HIGH (ref 70–99)
Glucose-Capillary: 113 mg/dL — ABNORMAL HIGH (ref 70–99)
Glucose-Capillary: 130 mg/dL — ABNORMAL HIGH (ref 70–99)
Glucose-Capillary: 88 mg/dL (ref 70–99)
Glucose-Capillary: 96 mg/dL (ref 70–99)

## 2023-07-06 NOTE — Plan of Care (Signed)
  Problem: Education: Goal: Ability to describe self-care measures that may prevent or decrease complications (Diabetes Survival Skills Education) will improve Outcome: Progressing Goal: Individualized Educational Video(s) Outcome: Progressing   Problem: Coping: Goal: Ability to adjust to condition or change in health will improve Outcome: Progressing   Problem: Fluid Volume: Goal: Ability to maintain a balanced intake and output will improve Outcome: Progressing   Problem: Health Behavior/Discharge Planning: Goal: Ability to identify and utilize available resources and services will improve Outcome: Progressing Goal: Ability to manage health-related needs will improve Outcome: Progressing   Problem: Metabolic: Goal: Ability to maintain appropriate glucose levels will improve Outcome: Progressing   Problem: Nutritional: Goal: Maintenance of adequate nutrition will improve Outcome: Progressing Goal: Progress toward achieving an optimal weight will improve Outcome: Progressing   Problem: Skin Integrity: Goal: Risk for impaired skin integrity will decrease Outcome: Progressing   Problem: Tissue Perfusion: Goal: Adequacy of tissue perfusion will improve Outcome: Progressing   Problem: Education: Goal: Knowledge of General Education information will improve Description: Including pain rating scale, medication(s)/side effects and non-pharmacologic comfort measures Outcome: Progressing   Problem: Health Behavior/Discharge Planning: Goal: Ability to manage health-related needs will improve Outcome: Progressing   Problem: Clinical Measurements: Goal: Ability to maintain clinical measurements within normal limits will improve Outcome: Progressing Goal: Will remain free from infection Outcome: Progressing Goal: Diagnostic test results will improve Outcome: Progressing Goal: Respiratory complications will improve Outcome: Progressing Goal: Cardiovascular complication will  be avoided Outcome: Progressing   Problem: Activity: Goal: Risk for activity intolerance will decrease Outcome: Progressing   Problem: Nutrition: Goal: Adequate nutrition will be maintained Outcome: Progressing   Problem: Coping: Goal: Level of anxiety will decrease Outcome: Progressing   Problem: Elimination: Goal: Will not experience complications related to bowel motility Outcome: Progressing Goal: Will not experience complications related to urinary retention Outcome: Progressing   Problem: Pain Management: Goal: General experience of comfort will improve Outcome: Progressing   Problem: Safety: Goal: Ability to remain free from injury will improve Outcome: Progressing   Problem: Skin Integrity: Goal: Risk for impaired skin integrity will decrease Outcome: Progressing   Problem: Education: Goal: Expressions of having a comfortable level of knowledge regarding the disease process will increase Outcome: Progressing   Problem: Coping: Goal: Ability to adjust to condition or change in health will improve Outcome: Progressing Goal: Ability to identify appropriate support needs will improve Outcome: Progressing   Problem: Health Behavior/Discharge Planning: Goal: Compliance with prescribed medication regimen will improve Outcome: Progressing   Problem: Medication: Goal: Risk for medication side effects will decrease Outcome: Progressing   Problem: Clinical Measurements: Goal: Complications related to the disease process, condition or treatment will be avoided or minimized Outcome: Progressing Goal: Diagnostic test results will improve Outcome: Progressing   Problem: Safety: Goal: Verbalization of understanding the information provided will improve Outcome: Progressing   Problem: Self-Concept: Goal: Level of anxiety will decrease Outcome: Progressing Goal: Ability to verbalize feelings about condition will improve Outcome: Progressing

## 2023-07-06 NOTE — Hospital Course (Addendum)
89 y.o. female with medical history significant of dementia largely wheelchair-bound, hypertension, hyperlipidemia, diabetes, COPD, CKD 3, CAD status post stent, epilepsy, diastolic CHF, anxiety, anemia, wheelchair-bound presenting with altered mental status and seizure-family found her.  With active seizure lasting couple of minutes resolving spontaneously. Patient has known history of dementia and seizure disorder with recent suspected breakthrough seizures that lead to admissions in October and November of this year. ED Course: Vitals stable with mild tachycardia tachypnea, labs CMP with bicarb 20, BUN 26, glucose 168.  CBC within normal limits.  Urinalysis with protein, leukocytes, bacteria.  Urine culture SENT CXR>mild vascular congestion CT head>small volume subarachnoid hemorrhage at the right frontal lobe CT C-spine> no acute normality CTA of the head and neck> motion degraded for both but showed no large vessel occlusion or significant stenosis of the head nor no hemodynamically significant stenosis of the neck within the motion degraded limitation. Neurology was consulted , given ceftriaxone,Keppra Ativan and admitted for further management of altered mental status with breakthrough seizure and small right frontal SAH concerning for possible amyloid etiology  LTM EEG no seizure, slow waves. Suspect breakthrough seizure secondary to acute toxic metabolic encephalopathy compounded by history of dementia, seen by neurology-placed on Keppra-on follow-up outpatient provider could consider increasing her Lamictal and wean off Keppra as outpatient.BP medication adjusted to optimize blood pressure for goal of 130-150. Pt is awaiting acceptance into [facility].

## 2023-07-06 NOTE — Progress Notes (Signed)
PROGRESS NOTE Michelle [name]  ZOX:[id number] DOB: 1932-12-19 DOA: 07/05/2023 PCP: Shelva Majestic, MD  Brief Narrative/Hospital Course: 89 y.o. female with medical history significant of dementia largely wheelchair-bound, hypertension, hyperlipidemia, diabetes, COPD, CKD 3, CAD status post stent, epilepsy, diastolic CHF, anxiety, anemia, wheelchair-bound presenting with altered mental status and seizure-family found her.  With active seizure lasting couple of minutes resolving spontaneously.   Patient has known history of dementia and seizure disorder with recent suspected breakthrough seizures that lead to admissions in October and November of this year.  ED Course: Vitals stable with mild tachycardia tachypnea, labs CMP with bicarb 20, BUN 26, glucose 168.  CBC within normal limits.  Urinalysis with protein, leukocytes, bacteria.  Urine culture SENT CXR>mild vascular congestion CT head>small volume subarachnoid hemorrhage at the right frontal lobe CT C-spine> no acute normality CTA of the head and neck> motion degraded for both but showed no large vessel occlusion or significant stenosis of the head nor no hemodynamically significant stenosis of the neck within the motion degraded limitation. Neurology was consulted , given ceftriaxone,Keppra Ativan and admitted for further management of altered mental status with breakthrough seizure subarachnoid bleed.        Subjective: Seen and examined Son [name] at bedside thinks- More alert awake, oriented to self place not to date still confused. Overnight afebrile blood pressure 160s-170s, on 2 L  Labs with stable CBC, BMP. On LTM EEG   Assessment and Plan: Active Problems:   Type 2 diabetes mellitus without complication, with long-term current use of insulin (HCC)   Hyperlipidemia associated with type 2 diabetes mellitus (HCC)   Essential hypertension   COPD (chronic obstructive pulmonary disease) (HCC)   Chronic diastolic CHF  (congestive heart failure) (HCC)   CAD- RCA PCI '90s, STEMI-RCA DES 02/18/14   Anxiety state   CKD (chronic kidney disease), stage III (HCC)   Anemia, iron deficiency   Dementia (HCC)   Epilepsy (HCC)   DNR (do not resuscitate)/DNI(Do Not Intubate)   Subarachnoid hemorrhage (HCC)   Seizure (HCC)  Breakthrough seizure with known seizure disorder: Acute toxic metabolic encephalopathy in the background of dementia/anxiety disorder: More alert awake, oriented to self place not to date still confused. On LTM EEG -It discharges with triphasic morphology intermittent slow, generalized history of mild diffuse encephalopathy. continue neurocheck every 4 hours, Continue Keppra 500 twice daily, home Lamictal. Continue seizure precaution.  Continue her Seroquel in the evening,.  Continue plan per neurology  Subarachnoid hemorrhage-small-volume: In the setting of seizure.  Neurology following maintain SBP 130s-150s avoid anticoagulants  UTI POA: Continue ceftriaxone follow-up culture.  Hypertension: SBP goal 130-150,continue home Coreg and enalapril, prns.  PTA also on amlodipine/hydralazine, prn lasix  CAD w./ hx of stents  per son abt 5 yrs ago, and in past 20 yr HLD: Continue statin.  Holding anticoagulants/antiplatelets-PTA was on aspirin Plavix.  Chronic diastolic CHF: Last EF normal 60 to 60%, on Lasix prn PTA.  Monitor fluid status.  Diabetes mellitus: Continue SSI, cbg stable. Recent Labs  Lab 07/05/23 1603 07/05/23 2034 07/05/23 2308 07/06/23 0310 07/06/23 0732  GLUCAP 112* 103* 100* 105* 96    COPD: Not in exacerbation.  Continue home inhaler  Ambulatory dysfunction with wheelchair-bound, lives with her younger son.  Goals of care: DNR but family okay with intubation if required for seizures.  Will consult palliative care at some point  DVT prophylaxis: SCDs Start: 07/05/23 1423 Code Status:   Code Status: Do not attempt resuscitation (DNR) PRE-ARREST INTERVENTIONS  DESIRED Family Communication: plan of care discussed with patient/son at bedside. Patient status is: Remains hospitalized because of severity of illness Level of care: Progressive   Dispo: The patient is from: Home w/ son            Anticipated disposition: TBD 1-2 days Objective: Vitals last 24 hrs: Vitals:   07/05/23 2354 07/06/23 0155 07/06/23 0349 07/06/23 0735  BP: (!) 174/74 (!) 160/70 (!) 165/71 (!) 167/75  Pulse: 74  79 72  Resp: 18  16 17   Temp: 98.2 F (36.8 C)  98.2 F (36.8 C) 98.6 F (37 C)  TempSrc: Oral  Oral Oral  SpO2: 100%  98% 97%  Weight:      Height:       Weight change:   Physical Examination: General exam: alert awake, oriented x 2 HEENT:Oral mucosa moist, Ear/Nose WNL grossly Respiratory system: Bilaterally clear BS,no use of accessory muscle Cardiovascular system: S1 & S2 +, No JVD. Gastrointestinal system: Abdomen soft,NT,ND, BS+ Nervous System: Alert, awake, moving all 4y extremities,and following commands. Extremities: LE edema neg,distal peripheral pulses palpable and warm.  Skin: No rashes,no icterus. MSK: Normal muscle bulk,tone, power   Medications reviewed:  Scheduled Meds:  carvedilol  12.5 mg Oral BID   enalapril  20 mg Oral QHS   icosapent Ethyl  1 g Oral BID WC   And   icosapent Ethyl  2 g Oral QHS   insulin aspart  0-15 Units Subcutaneous Q4H   lamoTRIgine  50 mg Oral BID   medroxyPROGESTERone  10 mg Oral Daily   mometasone-formoterol  2 puff Inhalation BID   pantoprazole  40 mg Oral Daily   QUEtiapine  25 mg Oral QPM   rosuvastatin  40 mg Oral Daily   sodium chloride flush  3 mL Intravenous Q12H   umeclidinium bromide  1 puff Inhalation Daily   Continuous Infusions:  cefTRIAXone (ROCEPHIN)  IV     levETIRAcetam Stopped (07/05/23 2237)   Diet Order             Diet NPO time specified  Diet effective now                  Intake/Output Summary (Last 24 hours) at 07/06/2023 1035 Last data filed at 07/06/2023  0926 Gross per 24 hour  Intake 303 ml  Output 1075 ml  Net -772 ml   Net IO Since Admission: -772 mL [07/06/23 1035]  Wt Readings from Last 3 Encounters:  07/05/23 75.8 kg  06/26/23 75.8 kg  06/14/23 76 kg     Unresulted Labs (From admission, onward)     Start     Ordered   07/05/23 1217  Urine Culture  Once,   URGENT       Question:  Indication  Answer:  Dysuria   07/05/23 1216          Data Reviewed: I have personally reviewed following labs and imaging studies CBC: Recent Labs  Lab 07/05/23 0908 07/06/23 0610  WBC 8.9 10.0  NEUTROABS 6.9  --   HGB 12.7 11.5*  HCT 41.2 36.3  MCV 86.7 85.4  PLT 360 338   Basic Metabolic Panel:  Recent Labs  Lab 07/05/23 0908 07/06/23 0610  NA 138 138  K 4.1 3.6  CL 105 109  CO2 20* 23  GLUCOSE 168* 91  BUN 26* 21  CREATININE 0.84 0.75  CALCIUM 10.1 9.2   GFR: Estimated Creatinine Clearance: 49.5 mL/min (by C-G formula based on  SCr of 0.75 mg/dL). Liver Function Tests:  Recent Labs  Lab 07/05/23 0908 07/06/23 0610  AST 25 20  ALT 18 16  ALKPHOS 58 48  BILITOT 0.5 0.5  PROT 7.4 6.7  ALBUMIN 3.6 3.1*  No results found for this or any previous visit (from the past 240 hours).  Antimicrobials/Microbiology: Anti-infectives (From admission, onward)    Start     Dose/Rate Route Frequency Ordered Stop   07/06/23 1200  cefTRIAXone (ROCEPHIN) 1 g in sodium chloride 0.9 % 100 mL IVPB        1 g 200 mL/hr over 30 Minutes Intravenous Every 24 hours 07/05/23 1427     07/05/23 1230  cefTRIAXone (ROCEPHIN) 1 g in sodium chloride 0.9 % 100 mL IVPB        1 g 200 mL/hr over 30 Minutes Intravenous  Once 07/05/23 1216 07/05/23 1324         Component Value Date/Time   SDES IN/OUT CATH URINE 05/17/2023 1428   SPECREQUEST  05/17/2023 1428    NONE Performed at [facility], [address], [place], [place] [address]    CULT (A) 05/17/2023 1428    50,000 COLONIES/mL MULTIPLE SPECIES PRESENT, SUGGEST RECOLLECTION    REPTSTATUS 05/19/2023 FINAL 05/17/2023 1428     Radiology Studies: Overnight EEG with video Result Date: 07/06/2023 Charlsie Quest, MD     07/06/2023  6:08 AM Patient Name: Michelle [name] MRN: [id number] Epilepsy Attending: Charlsie Quest Referring Physician/Provider: Marjorie Smolder, NP Duration: 07/05/2023 1319 to 07/06/2023 0600  Patient history: 89yo F with history of recent admission for breakthrough seizures with prior history of seizures, diabetes, COPD, modified Rankin score of 4 at baseline, presenting with concern for depressed mental status after a staring spell this morning concerning for seizure. EEG to evaluate for seizure  Level of alertness: Awake, asleep  AEDs during EEG study: LTG, LEV  Technical aspects: This EEG study was done with scalp electrodes positioned according to the 10-20 International system of electrode placement. Electrical activity was reviewed with band pass filter of 1-70Hz , sensitivity of 7 uV/mm, display speed of 22mm/sec with a 60Hz  notched filter applied as appropriate. EEG data were recorded continuously and digitally stored.  Video monitoring was available and reviewed as appropriate.  Description: The posterior dominant rhythm consists of 8 Hz activity of moderate voltage (25-35 uV) seen predominantly in posterior head regions, symmetric and reactive to eye opening and eye closing. EEG showed intermittent generalized 3 to 6 Hz theta-delta slowing as well as generalized periodic discharges with triphasic morphology at 1hz , when awake/stimulated. Hyperventilation and photic stimulation were not performed.    ABNORMALITY - Periodic discharges with triphasic morphology, generalized p - Intermittent slow, generalized  IMPRESSION: This study is suggestive of mild diffuse encephalopathy, likely secondary to toxic-metabolic causes. No seizures or epileptiform discharges were seen throughout the recording.  Priyanka Annabelle Harman   CT ANGIO HEAD W OR WO  CONTRAST Result Date: 07/05/2023 CLINICAL DATA:  Subarachnoid hemorrhage and seizures EXAM: CT ANGIOGRAPHY HEAD TECHNIQUE: Multidetector CT imaging of the head was performed using the standard protocol during bolus administration of intravenous contrast. Multiplanar CT image reconstructions and MIPs were obtained to evaluate the vascular anatomy. RADIATION DOSE REDUCTION: This exam was performed according to the departmental dose-optimization program which includes automated exposure control, adjustment of the mA and/or kV according to patient size and/or use of iterative reconstruction technique. CONTRAST:  75mL OMNIPAQUE IOHEXOL 350 MG/ML SOLN COMPARISON:  CTA head neck 07/05/2023 at 12:31 p.m. FINDINGS: POSTERIOR CIRCULATION: Vertebral arteries are normal. No proximal occlusion of the anterior or inferior cerebellar arteries. Basilar artery is normal. Superior cerebellar arteries are normal. Posterior cerebral arteries are normal. ANTERIOR CIRCULATION: Intracranial internal carotid arteries are normal. Anterior cerebral arteries are normal. Middle cerebral arteries are normal. Anatomic Variants: None Venous sinuses: As permitted by contrast timing, patent. Other: Redemonstration of small volume subarachnoid hemorrhage. Review of the MIP images confirms the above findings. IMPRESSION: 1. No intracranial aneurysm or vascular malformation. 2. Redemonstration of small volume subarachnoid hemorrhage. Electronically Signed   By: Deatra Robinson M.D.   On: 07/05/2023 22:50   CT ANGIO HEAD NECK W WO CM Result Date: 07/05/2023 CLINICAL DATA:  Subarachnoid hemorrhage EXAM: CT ANGIOGRAPHY HEAD AND NECK WITH AND WITHOUT CONTRAST TECHNIQUE: Multidetector CT imaging of the head and neck was performed using the standard protocol during bolus administration of intravenous contrast. Multiplanar CT image reconstructions and MIPs were obtained to evaluate the vascular anatomy. Carotid stenosis measurements (when applicable) are  obtained utilizing NASCET criteria, using the distal internal carotid diameter as the denominator. RADIATION DOSE REDUCTION: This exam was performed according to the departmental dose-optimization program which includes automated exposure control, adjustment of the mA and/or kV according to patient size and/or use of iterative reconstruction technique. CONTRAST:  50mL OMNIPAQUE IOHEXOL 350 MG/ML SOLN COMPARISON:  06/14/2023 CTA head and neck, correlation is also made with 07/05/2023 CT head FINDINGS: CT HEAD FINDINGS For noncontrast findings, please see same day CT head. CTA NECK FINDINGS Evaluation is limited by motion artifact. Aortic arch: Standard branching. Imaged portion shows no evidence of aneurysm or dissection. No significant stenosis of the major arch vessel origins. Aortic atherosclerosis with ulcerated plaque. Right carotid system: No evidence of dissection, occlusion, or hemodynamically significant stenosis (greater than 50%). Left carotid system: No evidence of dissection, occlusion, or hemodynamically significant stenosis (greater than 50%). Vertebral arteries: No evidence of dissection, occlusion, or hemodynamically significant stenosis (greater than 50%). Skeleton: No acute osseous abnormality. Degenerative changes in the cervical spine. Other neck: No acute finding. Upper chest: No focal pulmonary opacity or pleural effusion. Apical pleural-parenchymal scarring. Review of the MIP images confirms the above findings CTA HEAD FINDINGS Evaluation is significantly motion degraded. Within this limitation, the intracranial carotid arteries are patent to the termini. The proximal MCAs and ACAs appear patent, although evaluation of the distal vessels is not possible. The vertebral arteries are patent to the vertebrobasilar junction without significant stenosis. The PICAs appear patent proximally. The basilar artery is patent to its distal aspect without significant stenosis. The PCA s are patent  proximally. Evaluation of more distal PCAs is not possible. IMPRESSION: 1. Evaluation of the intracranial vasculature is significantly motion degraded. Within this limitation, no large vessel occlusion or significant stenosis in the proximal vessels of the head. 2. Evaluation of the neck vasculature is mildly motion degraded. Within this limitation, no hemodynamically significant stenosis. 3. Aortic atherosclerosis with ulcerated plaque. Aortic Atherosclerosis (ICD10-I70.0). Electronically Signed   By: Wiliam Ke M.D.   On: 07/05/2023 13:21   DG Chest Portable 1 View Result Date: 07/05/2023 CLINICAL DATA:  Altered mental status.  Seizure. EXAM: PORTABLE CHEST 1 VIEW COMPARISON:  06/14/2023. FINDINGS: Low lung volume. There are mildly increased interstitial markings with bilateral hilar predominance, similar to the prior study. No overt pulmonary edema. Bilateral lung fields are otherwise clear. No acute consolidation or lung collapse. Bilateral costophrenic angles are clear. Stable cardio-mediastinal silhouette. No acute osseous  abnormalities. The soft tissues are within normal limits. IMPRESSION: *Mild central vascular congestion. No frank pulmonary edema. No acute consolidation or lung collapse. Electronically Signed   By: Jules Schick M.D.   On: 07/05/2023 11:14   CT HEAD WO CONTRAST ( ) Result Date: 07/05/2023 CLINICAL DATA:  Polytrauma, blunt EXAM: CT HEAD WITHOUT CONTRAST CT CERVICAL SPINE WITHOUT CONTRAST TECHNIQUE: Multidetector CT imaging of the head and cervical spine was performed following the standard protocol without intravenous contrast. Multiplanar CT image reconstructions of the cervical spine were also generated. RADIATION DOSE REDUCTION: This exam was performed according to the departmental dose-optimization program which includes automated exposure control, adjustment of the mA and/or kV according to patient size and/or use of iterative reconstruction technique. COMPARISON:  CT  Head and C Spine 03/01/23 FINDINGS: Limitations: Motion degraded exam CT HEAD FINDINGS Brain: Small volume subarachnoid hemorrhage in the right frontal lobe (series 7, image 27). No hydrocephalus. No evidence of intraventricular hemorrhage. No CT evidence of an acute cortical infarct. No mass effect. No mass lesion. Vascular: No hyperdense vessel or unexpected calcification. Skull: Normal. Negative for fracture or focal lesion. Sinuses/Orbits: No middle ear or mastoid effusion. Paranasal sinuses are clear. Orbits are unremarkable. Other: None. CT CERVICAL SPINE FINDINGS Alignment: Grade 1 anterolisthesis of C6 on C7 Skull base and vertebrae: No acute fracture. No primary bone lesion or focal pathologic process. Unchanged likely degenerative fusion of C6-C7 Soft tissues and spinal canal: No prevertebral fluid or swelling. No visible canal hematoma. Disc levels:  No CT evidence of high-grade spinal canal stenosis Upper chest: Negative. Other: 1.2 cm left thyroid nodule. This is not meet size criteria for further imaging workup IMPRESSION: 1. Small volume subarachnoid hemorrhage in the right frontal lobe. 2. No acute fracture or traumatic subluxation of the cervical spine. Electronically Signed   By: Lorenza Cambridge M.D.   On: 07/05/2023 10:30   CT Cervical Spine Wo Contrast Result Date: 07/05/2023 CLINICAL DATA:  Polytrauma, blunt EXAM: CT HEAD WITHOUT CONTRAST CT CERVICAL SPINE WITHOUT CONTRAST TECHNIQUE: Multidetector CT imaging of the head and cervical spine was performed following the standard protocol without intravenous contrast. Multiplanar CT image reconstructions of the cervical spine were also generated. RADIATION DOSE REDUCTION: This exam was performed according to the departmental dose-optimization program which includes automated exposure control, adjustment of the mA and/or kV according to patient size and/or use of iterative reconstruction technique. COMPARISON:  CT Head and C Spine 03/01/23 FINDINGS:  Limitations: Motion degraded exam CT HEAD FINDINGS Brain: Small volume subarachnoid hemorrhage in the right frontal lobe (series 7, image 27). No hydrocephalus. No evidence of intraventricular hemorrhage. No CT evidence of an acute cortical infarct. No mass effect. No mass lesion. Vascular: No hyperdense vessel or unexpected calcification. Skull: Normal. Negative for fracture or focal lesion. Sinuses/Orbits: No middle ear or mastoid effusion. Paranasal sinuses are clear. Orbits are unremarkable. Other: None. CT CERVICAL SPINE FINDINGS Alignment: Grade 1 anterolisthesis of C6 on C7 Skull base and vertebrae: No acute fracture. No primary bone lesion or focal pathologic process. Unchanged likely degenerative fusion of C6-C7 Soft tissues and spinal canal: No prevertebral fluid or swelling. No visible canal hematoma. Disc levels:  No CT evidence of high-grade spinal canal stenosis Upper chest: Negative. Other: 1.2 cm left thyroid nodule. This is not meet size criteria for further imaging workup IMPRESSION: 1. Small volume subarachnoid hemorrhage in the right frontal lobe. 2. No acute fracture or traumatic subluxation of the cervical spine. Electronically Signed   By: Sandria Senter  Celine Mans M.D.   On: 07/05/2023 10:30    LOS: 0 days   Total time spent in review of labs and imaging, patient evaluation, formulation of plan, documentation and communication with family: 50  minutes  Lanae Boast, MD [facility]  07/06/2023, 10:35 AM

## 2023-07-06 NOTE — Procedures (Addendum)
Patient Name: BAELEY ORLIKOWSKI  MRN: [id number]  Epilepsy Attending: Charlsie Quest  Referring Physician/Provider: Marjorie Smolder, NP  Duration: 07/05/2023 1319 to 07/06/2023 1319   Patient history: 89yo F with history of recent admission for breakthrough seizures with prior history of seizures, diabetes, COPD, modified Rankin score of 4 at baseline, presenting with concern for depressed mental status after a staring spell this morning concerning for seizure. EEG to evaluate for seizure   Level of alertness: Awake, asleep   AEDs during EEG study: LTG, LEV   Technical aspects: This EEG study was done with scalp electrodes positioned according to the 10-20 International system of electrode placement. Electrical activity was reviewed with band pass filter of 1-70Hz , sensitivity of 7 uV/mm, display speed of 54mm/sec with a 60Hz  notched filter applied as appropriate. EEG data were recorded continuously and digitally stored.  Video monitoring was available and reviewed as appropriate.   Description: The posterior dominant rhythm consists of 8 Hz activity of moderate voltage (25-35 uV) seen predominantly in posterior head regions, symmetric and reactive to eye opening and eye closing. EEG showed intermittent generalized 3 to 6 Hz theta-delta slowing as well as generalized periodic discharges with triphasic morphology at 1hz , when awake/stimulated. Hyperventilation and photic stimulation were not performed.      ABNORMALITY - Periodic discharges with triphasic morphology, generalized  - Intermittent slow, generalized   IMPRESSION: This study is suggestive of mild diffuse encephalopathy, likely secondary to toxic-metabolic causes. No seizures or epileptiform discharges were seen throughout the recording.   Khylie Larmore Annabelle Harman

## 2023-07-06 NOTE — Progress Notes (Signed)
PT Cancellation Note  Patient Details Name: Michelle [name] MRN: [id number] DOB: [birth year]   Cancelled Treatment:    Reason Eval/Treat Not Completed: Medical issues which prohibited therapy  BP 180/73 with noted goal SBP <160. RN made aware. Son present and reports pt remains fairly sleepy today after seizure.    Jerolyn Center, PT Acute Rehabilitation Services  Office [phone]  Zena Amos 07/06/2023, 1:37 PM

## 2023-07-06 NOTE — TOC Initial Note (Signed)
Transition of Care [facility]) - Initial/Assessment Note    Patient Details  Name: Michelle [name] MRN: [id number] Date of Birth: 09-05-1932  Transition of Care [facility]) CM/SW Contact:    Kermit Balo, RN Phone Number: 07/06/2023, 1:18 PM  Clinical Narrative:                  Pt is from home with her son [name]. Son provides needed transportation and manages her medications.  She is active with [facility]. Will need resumption HH orders at dc. Family will transport home at d/c.   Expected Discharge Plan: Home w Home Health Services Barriers to Discharge: Continued Medical Work up   Patient Goals and CMS Choice   CMS Medicare.gov Compare Post Acute Care list provided to:: Patient Represenative (must comment) Choice offered to / list presented to : Adult Children      Expected Discharge Plan and Services   Discharge Planning Services: CM Consult Post Acute Care Choice: Home Health Living arrangements for the past 2 months: Single Family Home                                      Prior Living Arrangements/Services Living arrangements for the past 2 months: Single Family Home Lives with:: Adult Children Patient language and need for interpreter reviewed:: Yes Do you feel safe going back to the place where you live?: Yes      Need for Family Participation in Patient Care: Yes (Comment) Care giver support system in place?: Yes (comment) Current home services: DME (several walkers and rollator, has 3/1, ramp to get in home, shower chair.) Criminal Activity/Legal Involvement Pertinent to Current Situation/Hospitalization: No - Comment as needed  Activities of Daily Living   ADL Screening (condition at time of admission) Independently performs ADLs?: No Does the patient have a NEW difficulty with bathing/dressing/toileting/self-feeding that is expected to last >3 days?: No Does the patient have a NEW difficulty with getting in/out of bed, walking, or climbing stairs that  is expected to last >3 days?: No Does the patient have a NEW difficulty with communication that is expected to last >3 days?: No Is the patient deaf or have difficulty hearing?: Yes Does the patient have difficulty seeing, even when wearing glasses/contacts?: No Does the patient have difficulty concentrating, remembering, or making decisions?: No  Permission Sought/Granted                  Emotional Assessment Appearance:: Appears stated age Attitude/Demeanor/Rapport: Lethargic Affect (typically observed): Quiet Orientation: : Oriented to Self, Oriented to Place   Psych Involvement: No (comment)  Admission diagnosis:  Seizure (HCC) [R56.9] Seizures (HCC) [R56.9] SAH (subarachnoid hemorrhage) (HCC) [I60.9] Acute cystitis without hematuria [N30.00] Patient Active Problem List   Diagnosis Date Noted   Subarachnoid hemorrhage (HCC) 07/05/2023   Seizure (HCC) 07/05/2023   Overweight 06/16/2023   Hyperkalemia 06/14/2023   Breakthrough seizure (HCC) 05/17/2023   Wheelchair dependence 05/17/2023   DNR (do not resuscitate)/DNI(Do Not Intubate) 05/17/2023   Memory impairment 09/09/2022   Chronic pain of both knees 04/05/2022   Epilepsy (HCC) 03/12/2021   Adnexal mass 11/24/2020   Thickened endometrium 11/24/2020   Dementia (HCC) 10/22/2020   Hypokalemia 08/19/2020   Actinic keratosis 11/17/2017   Anemia, iron deficiency 10/23/2015   CKD (chronic kidney disease), stage III (HCC) 07/25/2014   Osteoarthritis, knee 05/13/2014   Anxiety state 04/21/2014   Chronic  diastolic CHF (congestive heart failure) (HCC) 03/13/2014   SOB (shortness of breath) 03/05/2014   CAD- RCA PCI '90s, STEMI-RCA DES 02/18/14 03/05/2014   Back pain, lumbosacral 10/10/2012   Type 2 diabetes mellitus without complication, with long-term current use of insulin (HCC) 03/26/2007   Hyperlipidemia associated with type 2 diabetes mellitus (HCC) 03/26/2007   Essential hypertension 04/21/2006   COPD (chronic  obstructive pulmonary disease) (HCC) 04/21/2006   PCP:  Shelva Majestic, MD Pharmacy:   [facility] [id number] - [address] [place] - [address] [facility] [address] [place] [address] Phone: [phone] Fax: [phone]  [facility] - [place], [place] - [address] [place] [address] Phone: [phone] Fax: [phone]  [place] [facility] [address] [place] [address] Phone: [phone] Fax: [phone]  [facility] [address] [place] [address] Phone: [phone] Fax: [phone]     Social Drivers of Health (SDOH) Social History: SDOH Screenings   Food Insecurity: Patient Unable To Answer (07/05/2023)  Housing: Patient Unable To Answer (07/05/2023)  Transportation Needs: Patient Unable To Answer (07/05/2023)  Utilities: Patient Unable To Answer (07/05/2023)  Depression (PHQ2-9): Medium Risk (06/26/2023)  Financial Resource Strain: Low Risk  (06/26/2023)  Physical Activity: Insufficiently Active (06/26/2023)  Social Connections: Socially Isolated (06/26/2023)  Stress: No Stress Concern Present (06/26/2023)  Tobacco Use: Medium Risk (07/05/2023)  Health Literacy: Adequate Health Literacy (06/26/2023)   SDOH Interventions:     Readmission Risk Interventions     No data to display

## 2023-07-06 NOTE — Progress Notes (Signed)
NEUROLOGY CONSULT FOLLOW UP NOTE   Date of service: July 06, 2023 Patient Name: Michelle [name] MRN:  [id number] DOB:  May 07, 1933  Brief HPI  Michelle [name] is a 89 y.o. 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 who presented with breakthrough seizure at home.  Family reported witnessing patient in bed with jerking of upper extremities lasting for several minutes s/p intranasal rescue medication and seizure cessation.  Patient had a second seizure during ED evaluation s/p lorazepam administration. CTH imaging revealed patient with a small volume SAH in the right frontal lobe with angiography imaging concerning for possible amyloid etiology.    Interval Hx/subjective  No acute overnight events.  Overnight EEG findings are suggestive of mild diffuse encephalopathy likely secondary to toxic metabolic disturbances without seizures or epileptiform discharges.  Vitals   Vitals:   07/05/23 2354 07/06/23 0155 07/06/23 0349 07/06/23 0735  BP: (!) 174/74 (!) 160/70 (!) 165/71 (!) 167/75  Pulse: 74  79 72  Resp: 18  16 17   Temp: 98.2 F (36.8 C)  98.2 F (36.8 C) 98.6 F (37 C)  TempSrc: Oral  Oral Oral  SpO2: 100%  98% 97%  Weight:      Height:        Body mass index is 27.81 kg/m.  Physical Exam   Constitutional: Frail, chronically ill-appearing elderly patient in no acute distress. LTM EEG monitoring in place.  Eyes: No scleral injection.  HENT: No OP obstruction.  Head: Normocephalic.  Cardiovascular: Normal rate and regular  rhythm.  Respiratory: Effort normal, non-labored breathing on supplemental O2 with nasal cannula dislodged from her nose on exam this morning Skin: WDI.   Neurologic Examination  Mental Status: Patient is asleep, wakes to voice.  Patient correctly states her first name "Michelle [name]" and that she is in the hospital.  She incorrectly states that she is 89 years old and that the year is 2074 and "83".  She incorrectly states that the month is June.  She does correctly explain to examiner that she is in the hospital because she had a seizure but is unable to recall events leading up to or directly after the seizure activity.  Patient does not name objects or repeat phrases and intermittently has unclear/mumbling speech.  She will intermittently follow simple commands with repeat instruction with limited participation in exam.  Cranial Nerves: II: Does not participate in visual field testing. PERRL III,IV, VI: Will briefly open eyes and track examiner to the left and right V: Facial sensation is intact and symmetric to light touch VII: Facial movement is symmetric resting and with movement VIII: Hearing is intact to voice X: Phonation intact XI: Head moves to the left and right equally XII: Does not protrude tongue to command Motor: Patient is able to briefly elevate bilateral upper extremities antigravity briefly but she attempts to lift the right upper extremity with the left arm.  Left upper extremity remains elevated for longer than the right but patient quickly loses concentration and drops both extremity prematurely despite ongoing coaching.   Patient with minimal effort at elevation of bilateral lower extremities but very briefly  maintains antigravity positioning of the right lower extremity with passive elevation.   Will minimally move both lower extremities without gravity. Sensory: Patient reacts equally to light touch throughout Cerebellar: Does not perform  Medications  Current  Facility-Administered Medications:    acetaminophen (TYLENOL) tablet 650 mg, 650 mg, Oral, Q6H PRN **OR** acetaminophen (TYLENOL) suppository 650 mg, 650 mg, Rectal, Q6H PRN, Synetta Fail, MD   albuterol (PROVENTIL) (2.5 MG/3ML) 0.083% nebulizer solution 2.5 mg, 2.5 mg, Inhalation, Q4H PRN, Synetta Fail, MD   carvedilol (COREG) tablet 12.5 mg, 12.5 mg, Oral, BID, Synetta Fail, MD, 12.5 mg at 07/05/23 2225   cefTRIAXone (ROCEPHIN) 1 g in sodium chloride 0.9 % 100 mL IVPB, 1 g, Intravenous, Q24H, Synetta Fail, MD   enalapril (VASOTEC) tablet 20 mg, 20 mg, Oral, QHS, Synetta Fail, MD, 20 mg at 07/05/23 2225   icosapent Ethyl (VASCEPA) 1 g capsule 1 g, 1 g, Oral, BID WC **AND** icosapent Ethyl (VASCEPA) 1 g capsule 2 g, 2 g, Oral, QHS, Jardin, Carla G, RPH   insulin aspart (novoLOG) injection 0-15 Units, 0-15 Units, Subcutaneous, Q4H, Synetta Fail, MD   labetalol (NORMODYNE) injection 5 mg, 5 mg, Intravenous, Q2H PRN, Synetta Fail, MD, 5 mg at 07/06/23 0155   lamoTRIgine (LAMICTAL) tablet 50 mg, 50 mg, Oral, BID, Synetta Fail, MD, 50 mg at 07/05/23 2226   levETIRAcetam (KEPPRA) IVPB 500 mg/100 mL premix, 500 mg, Intravenous, Q12H, Synetta Fail, MD, Stopped at 07/05/23 2237   loperamide (IMODIUM) capsule 2 mg, 2 mg, Oral, BID PRN, Synetta Fail, MD   LORazepam (ATIVAN) injection 2 mg, 2 mg, Intravenous, Q5 Min x 2 PRN, Synetta Fail, MD   medroxyPROGESTERone (PROVERA) tablet 10 mg, 10 mg, Oral, Daily, Synetta Fail, MD   mometasone-formoterol (DULERA) 200-5 MCG/ACT inhaler 2 puff, 2 puff, Inhalation, BID, Synetta Fail, MD   pantoprazole (PROTONIX) EC tablet 40 mg, 40 mg, Oral, Daily, Synetta Fail, MD   polyethylene glycol (MIRALAX / GLYCOLAX) packet 17 g, 17 g, Oral, Daily PRN, Synetta Fail, MD   QUEtiapine (SEROQUEL) tablet 25 mg, 25 mg, Oral, QPM, Synetta Fail, MD, 25 mg at 07/05/23 2226    rosuvastatin (CRESTOR) tablet 40 mg, 40 mg, Oral, Daily, Synetta Fail, MD   sodium chloride flush (NS) 0.9 % injection 3 mL, 3 mL, Intravenous, Q12H, Synetta Fail, MD, 3 mL at 07/05/23 2119   umeclidinium bromide (INCRUSE ELLIPTA) 62.5 MCG/ACT 1 puff, 1 puff, Inhalation, Daily, Synetta Fail, MD Labs and Diagnostic Imaging   CBC:  Recent Labs  Lab 07/05/23 0908 07/06/23 0610  WBC 8.9 10.0  NEUTROABS 6.9  --   HGB 12.7 11.5*  HCT 41.2 36.3  MCV 86.7 85.4  PLT 360 338   Basic Metabolic Panel:  Lab Results  Component Value Date   NA 138 07/06/2023   K 3.6 07/06/2023   CO2 23 07/06/2023   GLUCOSE 91 07/06/2023   BUN 21 07/06/2023   CREATININE 0.75 07/06/2023   CALCIUM 9.2 07/06/2023   GFRNONAA >60 07/06/2023   GFRAA 77 07/01/2020   Lipid Panel:  Lab Results  Component Value Date   LDLCALC 32 08/19/2022   HgbA1c:  Lab Results  Component Value Date   HGBA1C 6.4 06/12/2023   Urine Drug Screen:     Component Value Date/Time   LABOPIA NONE DETECTED 06/14/2023 1545   COCAINSCRNUR NONE DETECTED 06/14/2023 1545   LABBENZ NONE  DETECTED 06/14/2023 1545   AMPHETMU NONE DETECTED 06/14/2023 1545   THCU NONE DETECTED 06/14/2023 1545   LABBARB NONE DETECTED 06/14/2023 1545    Alcohol Level     Component Value Date/Time   ETH <10 06/14/2023 1630   INR  Lab Results  Component Value Date   INR 1.2 06/15/2023   APTT  Lab Results  Component Value Date   APTT 29 06/15/2023   AED levels:  Lab Results  Component Value Date   LAMOTRIGINE <1.0 (L) 05/17/2023   CT Head without contrast(Personally reviewed): Small volume subarachnoid hemorrhage in the right frontal lobe   CT angio Head and Neck with contrast(Personally reviewed): Motion limited study with no LVO or significant stenosis in the proximal vessels, no aneurysm seen, but again study is motion limited  Repeat CT angio head: 1. No intracranial aneurysm or vascular malformation. 2.  Redemonstration of small volume subarachnoid hemorrhage.  cEEG 12/18 - 12/19:  "This study is suggestive of mild diffuse encephalopathy, likely secondary to toxic-metabolic causes. No seizures or epileptiform discharges were seen throughout the recording."  Assessment   Michelle [name] is a 89 y.o. 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  Patient had another witnessed seizure while in the ED s/p Ativan.  CTH imaging revealed a small right frontal lobe SAH that is nontraumatic and non-aneurysmal with imaging concerning for underlying amyloid angiopathy etiology. Patient was admitted for cEEG monitoring overnight.   Recommendations   - Continue LTM EEG overnight, if no epileptiform activity OK to d/c it in AM - Continue Keppra 500 mg BID IV until patient is able to tolerate PO medications - Continue home LMTG when patient is able to tolerate PO medications - Continue seizure precautions - SBP 130-150 - No anticoagulation, pharmacologic DVT prophylaxis, or antiplatelets in setting of SAH  ______________________________________________________________________  Signed, Kara Mead, NP [facility]   Attending Neurohospitalist Addendum Patient seen and examined with APP/Resident. Agree with the history and physical as documented above. Agree with the plan as documented, which I helped formulate. I have edited the note above to reflect my full findings and recommendations. I have independently reviewed the chart, obtained history, review of systems and examined the patient.I have personally reviewed pertinent head/neck/spine imaging (CT/MRI). Please feel free  to call with any questions.  -- Bing Neighbors, MD [facility] [phone]  If 7pm- 7am, please page neurology on call as listed in AMION.

## 2023-07-06 NOTE — Evaluation (Signed)
Clinical/Bedside Swallow Evaluation Patient Details  Name: Michelle [name] MRN: [id number] Date of Birth: 02-Aug-1932  Today's Date: 07/06/2023 Time: SLP Start Time (ACUTE ONLY): 1412 SLP Stop Time (ACUTE ONLY): 1426 SLP Time Calculation (min) (ACUTE ONLY): 14 min  Past Medical History:  Past Medical History:  Diagnosis Date   Anginal pain (HCC)    Arthritis    AV block, 1st degree    CAD (coronary artery disease) 1990; 2015   Cardiac cath 1990 with Dr. Riley Kill and [name] Residual CAD, non obstructive per 2015 cath. STEMI status post stent in August 2015.   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 02/18/14   Promus DES to RCA with STEMI   Shortness of breath    Past Surgical History:  Past Surgical History:  Procedure Laterality Date   CATARACT EXTRACTION  2020   right and left eye    CHOLECYSTECTOMY     thinks her appendix was removed at the same time   CORONARY ANGIOPLASTY WITH STENT PLACEMENT  02/18/14   Promus DES to RCA   LEFT HEART CATHETERIZATION WITH CORONARY ANGIOGRAM N/A 02/18/2014   Procedure: LEFT HEART CATHETERIZATION WITH CORONARY ANGIOGRAM;  Surgeon: Corky Crafts, MD;  Location: [facility] CATH LAB;  Service: Cardiovascular;  Laterality: N/A;   PTCA  1990   PTCA of RCA   TRANSTHORACIC ECHOCARDIOGRAM  02/02/2012   mild LVH, EF 55-60%, Normal WM, Gr 1 DD; Mild MR   HPI:  Michelle [name] is an 89 yo female presenting to ED 12/18 with AMS and seizure. Previously admitted in October and November 2024 for similar suspected  breakthrough seizures. Known to SLP during previous admission with recommendations for a Dys 3 diet with thin liquids. PMH includes dementia, HTN, HLD, DM, COPD, CKD 3, CAD s/p stent, epilepsy, diastolic CHF, anxiety, anemia, wheelchair bound    Assessment / Plan / Recommendation  Clinical Impression  Pt is edentulous and her son, who she lives with, states he prepares all of her meals and cuts them into small pieces. Observed pt with trials of thin liquids, purees, and solids with no signs clinically concerning for dysphagia or aspiration. Recommend advancing diet to Dys 3 solids with thin liquids and meds should be given crushed in puree. Pt has been drowsy throughout this admission, although appears interested in POs. SLP will continue following given potential lethargy. SLP Visit Diagnosis: Dysphagia, unspecified (R13.10)    Aspiration Risk  Mild aspiration risk    Diet Recommendation Dysphagia 3 (Mech soft);Thin liquid    Liquid Administration via: Cup;Straw Medication Administration: Crushed with puree Supervision: Patient able to self feed;Intermittent supervision to cue for compensatory strategies Compensations: Minimize environmental distractions;Slow rate;Small sips/bites Postural Changes: Seated upright at 90 degrees    Other  Recommendations Oral Care Recommendations: Oral care BID;Staff/trained caregiver to provide oral care    Recommendations for follow up therapy are one component of a multi-disciplinary discharge planning process, led by the attending physician.  Recommendations may be updated based on patient status, additional functional criteria  and insurance authorization.  Follow up Recommendations No SLP follow up      Assistance Recommended at Discharge    Functional Status Assessment Patient has had a recent decline in their functional status and demonstrates the ability to make significant improvements in function in a reasonable and predictable amount of time.   Frequency and Duration min 2x/week  1 week       Prognosis Prognosis for improved oropharyngeal function: Good Barriers to Reach Goals: Time post onset;Cognitive deficits      Swallow Study   General HPI: Michelle [name] is an 89 yo female presenting to ED 12/18 with AMS and seizure. Previously admitted in October and November 2024 for similar suspected breakthrough seizures. Known to SLP during previous admission with recommendations for a Dys 3 diet with thin liquids. PMH includes dementia, HTN, HLD, DM, COPD, CKD 3, CAD s/p stent, epilepsy, diastolic CHF, anxiety, anemia, wheelchair bound Type of Study: Bedside Swallow Evaluation Previous Swallow Assessment: see HPI Diet Prior to this Study: NPO Temperature Spikes Noted: No Respiratory Status: Room air History of Recent Intubation: No Behavior/Cognition: Alert;Cooperative;Pleasant mood Oral Cavity Assessment: Within Functional Limits Oral Care Completed by SLP: No Oral Cavity - Dentition: Edentulous Vision: Functional for self-feeding Self-Feeding Abilities: Able to feed self Patient Positioning: Upright in bed Baseline Vocal Quality: Normal Volitional Cough: Congested Volitional Swallow: Able to elicit    Oral/Motor/Sensory Function Overall Oral Motor/Sensory Function: Within functional limits   Ice Chips Ice chips: Not tested   Thin Liquid Thin Liquid: Within functional limits Presentation: Straw    Nectar Thick Nectar Thick Liquid: Not tested   Honey Thick Honey Thick Liquid: Not tested   Puree Puree: Within functional limits Presentation: Spoon   Solid     Solid: Within functional limits Presentation: Self Fed      Gwynneth Aliment, M.A., CF-SLP Speech Language Pathology, Acute Rehabilitation Services  Secure Chat preferred [phone]  07/06/2023,2:44 PM

## 2023-07-06 NOTE — Progress Notes (Signed)
vLTM maintenance  All impedances below 10kohms.  No skin breakdown noted at FP1 FP2  FZ  CZ

## 2023-07-07 DIAGNOSIS — R569 Unspecified convulsions: Secondary | ICD-10-CM | POA: Diagnosis not present

## 2023-07-07 DIAGNOSIS — I609 Nontraumatic subarachnoid hemorrhage, unspecified: Secondary | ICD-10-CM | POA: Diagnosis not present

## 2023-07-07 LAB — GLUCOSE, CAPILLARY
Glucose-Capillary: 137 mg/dL — ABNORMAL HIGH (ref 70–99)
Glucose-Capillary: 110 mg/dL — ABNORMAL HIGH (ref 70–99)
Glucose-Capillary: 152 mg/dL — ABNORMAL HIGH (ref 70–99)
Glucose-Capillary: 113 mg/dL — ABNORMAL HIGH (ref 70–99)
Glucose-Capillary: 179 mg/dL — ABNORMAL HIGH (ref 70–99)
Glucose-Capillary: 140 mg/dL — ABNORMAL HIGH (ref 70–99)

## 2023-07-07 LAB — URINE CULTURE: Culture: >=100000 — AB

## 2023-07-07 MED ORDER — LEVETIRACETAM 500 MG PO TABS
500.0000 mg | ORAL_TABLET | Freq: Two times a day (BID) | ORAL | Status: DC
Start: 2023-07-07 — End: 2023-07-17
  Administered 2023-07-07 – 2023-07-17 (×21): 500 mg via ORAL
  Filled 2023-07-07 (×21): qty 1

## 2023-07-07 MED ORDER — SERTRALINE HCL 100 MG PO TABS
100.0000 mg | ORAL_TABLET | Freq: Every day | ORAL | Status: DC
  Administered 2023-07-07 – 2023-07-23 (×17): 100 mg via ORAL
  Filled 2023-07-07 (×17): qty 1

## 2023-07-07 MED ORDER — AMLODIPINE BESYLATE 2.5 MG PO TABS
2.5000 mg | ORAL_TABLET | Freq: Every day | ORAL | Status: DC
Start: 2023-07-07 — End: 2023-07-08
  Administered 2023-07-07: 2.5 mg via ORAL
  Filled 2023-07-07: qty 1

## 2023-07-07 MED ORDER — NORTRIPTYLINE HCL 10 MG PO CAPS
10.0000 mg | ORAL_CAPSULE | Freq: Every day | ORAL | Status: DC
  Administered 2023-07-07 – 2023-07-23 (×17): 10 mg via ORAL
  Filled 2023-07-07 (×18): qty 1

## 2023-07-07 MED ORDER — FENOFIBRATE 54 MG PO TABS
54.0000 mg | ORAL_TABLET | Freq: Every day | ORAL | Status: DC
  Administered 2023-07-07 – 2023-07-24 (×18): 54 mg via ORAL
  Filled 2023-07-07 (×19): qty 1

## 2023-07-07 NOTE — Progress Notes (Signed)
 LTM EEG discontinued by MB - no skin breakdown at [facility].

## 2023-07-07 NOTE — Procedures (Addendum)
Patient Name: Michelle [name]  MRN: [id number]  Epilepsy Attending: Charlsie Quest  Referring Physician/Provider: Marjorie Smolder, NP  Duration: 07/06/2023 1319 to 07/07/2023 1227   Patient history: 89yo F with history of recent admission for breakthrough seizures with prior history of seizures, diabetes, COPD, modified Rankin score of 4 at baseline, presenting with concern for depressed mental status after a staring spell this morning concerning for seizure. EEG to evaluate for seizure   Level of alertness: Awake, asleep   AEDs during EEG study: LTG, LEV   Technical aspects: This EEG study was done with scalp electrodes positioned according to the 10-20 International system of electrode placement. Electrical activity was reviewed with band pass filter of 1-70Hz , sensitivity of 7 uV/mm, display speed of 22mm/sec with a 60Hz  notched filter applied as appropriate. EEG data were recorded continuously and digitally stored.  Video monitoring was available and reviewed as appropriate.   Description: The posterior dominant rhythm consists of 8 Hz activity of moderate voltage (25-35 uV) seen predominantly in posterior head regions, symmetric and reactive to eye opening and eye closing. EEG showed intermittent generalized 3 to 6 Hz theta-delta slowing, at times with triphasic morphology Hyperventilation and photic stimulation were not performed.    Parts of study were difficult to interpret due to significant movement and electrode artifact.    ABNORMALITY - Intermittent slow, generalized   IMPRESSION: This technically difficult study is suggestive of mild diffuse encephalopathy. No seizures or epileptiform discharges were seen throughout the recording.   Serah Nicoletti Annabelle Harman

## 2023-07-07 NOTE — Evaluation (Signed)
Occupational Therapy Evaluation Patient Details Name: Michelle [name] MRN: [id number] DOB: 16-Apr-1933 Today's Date: 07/07/2023   History of Present Illness 89 yo female presenting with AMS and seizures. CT head showing Small volume subarachnoid hemorrhage in the right frontal lobe. PMH including dementia, hypertension, hyperlipidemia, diabetes, COPD, CKD 3, CAD status post stent, epilepsy, diastolic CHF, anxiety, anemia.   Clinical Impression   PTA, pt was living with her son Michelle [name]) and requiring assistance for bathing, dressing, and toileting as well as reliant on w/c for mobility. Pt's son, Michelle Hua, reports that HHPT had recently started at home. Pt currently requiring Mod A for UB ADLs, Max A for LB ADLs, and Max A +2 for sit<>stand at EOB. Pt presenting with decreased balance, strength, cognition, and activity tolerance. BP within parameters throughout session. Pt would benefit from further acute OT to facilitate safe dc. Recommend dc to home with HHOT for further OT to optimize safety, independence with ADLs, and return to PLOF.       If plan is discharge home, recommend the following: A lot of help with walking and/or transfers;A lot of help with bathing/dressing/bathroom;Assistance with cooking/housework;Direct supervision/assist for medications management;Direct supervision/assist for financial management;Assist for transportation;Help with stairs or ramp for entrance    Functional Status Assessment  Patient has had a recent decline in their functional status and demonstrates the ability to make significant improvements in function in a reasonable and predictable amount of time.  Equipment Recommendations  None recommended by OT    Recommendations for Other Services       Precautions / Restrictions Precautions Precautions: Fall Restrictions Weight Bearing Restrictions Per Provider Order: No      Mobility Bed Mobility Overal bed mobility: Needs Assistance Bed Mobility: Supine  to Sit, Sit to Supine Rolling: Max assist, +2 for physical assistance   Supine to sit: Max assist, +2 for physical assistance, HOB elevated Sit to supine: Max assist, +2 for physical assistance, Used rails   General bed mobility comments: Pt requiring assistance for bringing hips to EOB with bed pad and then elevating trunk. Max A for returnign to supine. Rolling with Max A for repositioning bed pad.    Transfers Overall transfer level: Needs assistance Equipment used: 2 person hand held assist Transfers: Sit to/from Stand Sit to Stand: Max assist, +2 physical assistance           General transfer comment: Max A +2 for power up and tactile cues for tucking pelvis and elevating trunk.      Balance Overall balance assessment: Needs assistance Sitting-balance support: No upper extremity supported, Feet supported Sitting balance-Leahy Scale: Fair Sitting balance - Comments: Supervision for safety   Standing balance support: Bilateral upper extremity supported, During functional activity Standing balance-Leahy Scale: Zero Standing balance comment: Unable to maintain standing without Max A                           ADL either performed or assessed with clinical judgement   ADL Overall ADL's : Needs assistance/impaired Eating/Feeding: Set up;Supervision/ safety;Bed level   Grooming: Minimal assistance;Bed level   Upper Body Bathing: Moderate assistance;Sitting   Lower Body Bathing: Maximal assistance;Sit to/from stand   Upper Body Dressing : Moderate assistance;Sitting   Lower Body Dressing: Maximal assistance     Toilet Transfer Details (indicate cue type and reason): sit<>stand at EOB only         Functional mobility during ADLs: Maximal assistance;+2 for physical assistance (sit<>stand  at EOB only) General ADL Comments: Pt demonstrating poor cogntiion, balance, strength, and activity tolerance. after sit<>stand, pt very fatigues and falling asleep in bed.      Vision Baseline Vision/History: 1 Wears glasses       Perception         Praxis         Pertinent Vitals/Pain Pain Assessment Pain Assessment: Faces Faces Pain Scale: Hurts even more Pain Location: knees with standing Pain Descriptors / Indicators: Aching, Sore, Grimacing Pain Intervention(s): Monitored during session, Limited activity within patient's tolerance, Repositioned     Extremity/Trunk Assessment Upper Extremity Assessment Upper Extremity Assessment: Generalized weakness   Lower Extremity Assessment Lower Extremity Assessment: Defer to PT evaluation   Cervical / Trunk Assessment Cervical / Trunk Assessment: Kyphotic   Communication     Cognition Arousal: Alert Behavior During Therapy: Flat affect Overall Cognitive Status: Impaired/Different from baseline Area of Impairment: Memory, Following commands, Safety/judgement, Awareness, Problem solving, Orientation, Attention                 Orientation Level: Time, Situation Current Attention Level: Selective, Sustained Memory: Decreased short-term memory Following Commands: Follows one step commands with increased time Safety/Judgement: Decreased awareness of safety, Decreased awareness of deficits Awareness: Intellectual, Emergent Problem Solving: Slow processing General Comments: Alert upon arrival and eating breakfast. Engaging in conversation. Slow to process movement for bed mobility. Requiring cues throughout and increased time. Not oriented to year (2005) and situation. Was able to state "[employer]"     General Comments  Son, Michelle Hua, present throughout. BP supine 131/54 (76) before mobility. BP supine 128/51 (74) after mobility.    Exercises     Shoulder Instructions      Home Living Family/patient expects to be discharged to:: Private residence Living Arrangements: Children (Son, Michelle [name]) Available Help at Discharge: Family;Available 24 hours/day Type of Home: House Home Access:  Ramped entrance     Home Layout: One level     Bathroom Shower/Tub: Producer, television/film/video: Handicapped height   How Accessible: Accessible via walker Home Equipment: Rollator (4 wheels);Rolling Walker (2 wheels);BSC/3in1;Shower seat;Wheelchair - manual;Tub bench;Grab bars - toilet;Toilet riser          Prior Functioning/Environment Prior Level of Function : Needs assist       Physical Assist : Mobility (physical);ADLs (physical)     Mobility Comments: Using w/c mainly since admission in November, PT stated coming to home recently and started working on mobility with RW short distance. ADLs Comments: needs min assist for bathing and bathing from family; depends on how fatigued she is. Supervision-Min A for toileting in bathroom. Using BSC at night by bed.        OT Problem List: Decreased strength;Decreased range of motion;Decreased activity tolerance;Impaired balance (sitting and/or standing);Decreased safety awareness;Decreased knowledge of use of DME or AE;Decreased knowledge of precautions;Decreased cognition      OT Treatment/Interventions: Self-care/ADL training;Therapeutic exercise;DME and/or AE instruction;Therapeutic activities;Patient/family education;Balance training    OT Goals(Current goals can be found in the care plan section) Acute Rehab OT Goals Patient Stated Goal: Get stronger OT Goal Formulation: With patient Time For Goal Achievement: 07/21/23 Potential to Achieve Goals: Good  OT Frequency: Min 1X/week    Co-evaluation PT/OT/SLP Co-Evaluation/Treatment: Yes Reason for Co-Treatment: For patient/therapist safety;To address functional/ADL transfers   OT goals addressed during session: ADL's and self-care      AM-PAC OT "6 Clicks" Daily Activity     Outcome Measure Help from another person eating meals?:  A Little Help from another person taking care of personal grooming?: A Little Help from another person toileting, which includes using  toliet, bedpan, or urinal?: A Lot Help from another person bathing (including washing, rinsing, drying)?: A Lot Help from another person to put on and taking off regular upper body clothing?: A Lot Help from another person to put on and taking off regular lower body clothing?: A Lot 6 Click Score: 14   End of Session Equipment Utilized During Treatment: Gait belt Nurse Communication: Mobility status;Patient requests pain meds (HA)  Activity Tolerance: Patient tolerated treatment well Patient left: in bed;with call bell/phone within reach;with bed alarm set;with family/visitor present  OT Visit Diagnosis: Unsteadiness on feet (R26.81);Other abnormalities of gait and mobility (R26.89);Muscle weakness (generalized) (M62.81);Other symptoms and signs involving cognitive function;Pain                Time: 1111-1141 OT Time Calculation (min): 30 min Charges:  OT General Charges $OT Visit: 1 Visit OT Evaluation $OT Eval Moderate Complexity: 1 Mod  Treyshon Buchanon MSOT, OTR/L Acute Rehab Office: [phone]  Theodoro Grist Elynore Dolinski 07/07/2023, 12:16 PM

## 2023-07-07 NOTE — Plan of Care (Signed)
  Problem: Education: Goal: Ability to describe self-care measures that may prevent or decrease complications (Diabetes Survival Skills Education) will improve Outcome: Progressing Goal: Individualized Educational Video(s) Outcome: Progressing   Problem: Coping: Goal: Ability to adjust to condition or change in health will improve Outcome: Progressing   Problem: Fluid Volume: Goal: Ability to maintain a balanced intake and output will improve Outcome: Progressing   Problem: Health Behavior/Discharge Planning: Goal: Ability to identify and utilize available resources and services will improve Outcome: Progressing Goal: Ability to manage health-related needs will improve Outcome: Progressing   Problem: Metabolic: Goal: Ability to maintain appropriate glucose levels will improve Outcome: Progressing   Problem: Nutritional: Goal: Maintenance of adequate nutrition will improve Outcome: Progressing Goal: Progress toward achieving an optimal weight will improve Outcome: Progressing   Problem: Skin Integrity: Goal: Risk for impaired skin integrity will decrease Outcome: Progressing   Problem: Tissue Perfusion: Goal: Adequacy of tissue perfusion will improve Outcome: Progressing   Problem: Education: Goal: Knowledge of General Education information will improve Description: Including pain rating scale, medication(s)/side effects and non-pharmacologic comfort measures Outcome: Progressing   Problem: Health Behavior/Discharge Planning: Goal: Ability to manage health-related needs will improve Outcome: Progressing   Problem: Clinical Measurements: Goal: Ability to maintain clinical measurements within normal limits will improve Outcome: Progressing Goal: Will remain free from infection Outcome: Progressing Goal: Diagnostic test results will improve Outcome: Progressing Goal: Respiratory complications will improve Outcome: Progressing Goal: Cardiovascular complication will  be avoided Outcome: Progressing   Problem: Activity: Goal: Risk for activity intolerance will decrease Outcome: Progressing   Problem: Nutrition: Goal: Adequate nutrition will be maintained Outcome: Progressing   Problem: Coping: Goal: Level of anxiety will decrease Outcome: Progressing   Problem: Elimination: Goal: Will not experience complications related to bowel motility Outcome: Progressing Goal: Will not experience complications related to urinary retention Outcome: Progressing   Problem: Pain Management: Goal: General experience of comfort will improve Outcome: Progressing   Problem: Safety: Goal: Ability to remain free from injury will improve Outcome: Progressing   Problem: Skin Integrity: Goal: Risk for impaired skin integrity will decrease Outcome: Progressing   Problem: Education: Goal: Expressions of having a comfortable level of knowledge regarding the disease process will increase Outcome: Progressing   Problem: Coping: Goal: Ability to adjust to condition or change in health will improve Outcome: Progressing Goal: Ability to identify appropriate support needs will improve Outcome: Progressing   Problem: Health Behavior/Discharge Planning: Goal: Compliance with prescribed medication regimen will improve Outcome: Progressing   Problem: Medication: Goal: Risk for medication side effects will decrease Outcome: Progressing   Problem: Clinical Measurements: Goal: Complications related to the disease process, condition or treatment will be avoided or minimized Outcome: Progressing Goal: Diagnostic test results will improve Outcome: Progressing   Problem: Safety: Goal: Verbalization of understanding the information provided will improve Outcome: Progressing   Problem: Self-Concept: Goal: Level of anxiety will decrease Outcome: Progressing Goal: Ability to verbalize feelings about condition will improve Outcome: Progressing

## 2023-07-07 NOTE — Progress Notes (Signed)
vLTM maintenance  All imp below 10k.  No skin breakdown noted at   F4 C4  A2  P4

## 2023-07-07 NOTE — Evaluation (Signed)
Physical Therapy Evaluation Patient Details Name: Michelle [name] MRN: [id number] DOB: 1933/01/28 Today's Date: 07/07/2023  History of Present Illness  89 yo female presenting with AMS and seizures. CT head showing Small volume subarachnoid hemorrhage in the right frontal lobe. PMH including dementia, hypertension, hyperlipidemia, diabetes, COPD, CKD 3, CAD status post stent, epilepsy, diastolic CHF, anxiety, anemia.  Clinical Impression  Patient presents with decreased mobility due to generalized weakness, decreased arousal, decreased balance and decreased cognition.  Previously able to transfer and complete ADL's at home with family support and receiving HHPT to begin working on walking.  Currently needing max +2 A for mobility up to stand at bedside and with feet sliding out when attempting side steps to [facility].  She will benefit from skilled PT in the acute setting and will need to resume HHPT at d/c.  Depending on progress during acute stay may need ambulance transport at d/c.       If plan is discharge home, recommend the following: Two people to help with walking and/or transfers;Two people to help with bathing/dressing/bathroom;Supervision due to cognitive status;Assist for transportation;Assistance with cooking/housework;Help with stairs or ramp for entrance   Can travel by private vehicle   No (son hopeful for improvement prior to d/c)    Equipment Recommendations None recommended by PT  Recommendations for Other Services       Functional Status Assessment Patient has had a recent decline in their functional status and demonstrates the ability to make significant improvements in function in a reasonable and predictable amount of time.     Precautions / Restrictions Precautions Precautions: Fall Precaution Comments: LTM EEG Restrictions Weight Bearing Restrictions Per Provider Order: No      Mobility  Bed Mobility Overal bed mobility: Needs Assistance Bed Mobility: Supine  to Sit, Sit to Supine Rolling: Max assist, +2 for physical assistance   Supine to sit: Max assist, +2 for physical assistance, HOB elevated Sit to supine: Max assist, +2 for physical assistance, Used rails   General bed mobility comments: Pt requiring assistance for bringing hips to EOB with bed pad and then elevating trunk. Max A for returning to supine. Rolling with Max A for repositioning bed pad.    Transfers Overall transfer level: Needs assistance Equipment used: 2 person hand held assist Transfers: Sit to/from Stand Sit to Stand: Max assist, +2 physical assistance           General transfer comment: Max A +2 for power up and tactile cues for tucking pelvis and elevating trunk.  slight improvement over 3 trials; able to take two steps toward [facility], then feet began to slide out so returned to EOB    Ambulation/Gait                  Stairs            Wheelchair Mobility     Tilt Bed    Modified Rankin (Stroke Patients Only)       Balance Overall balance assessment: Needs assistance Sitting-balance support: No upper extremity supported, Feet supported Sitting balance-Leahy Scale: Fair Sitting balance - Comments: Supervision for safety   Standing balance support: Bilateral upper extremity supported, During functional activity Standing balance-Leahy Scale: Zero Standing balance comment: Unable to maintain standing without Max A                             Pertinent Vitals/Pain Pain Assessment Pain Assessment: Faces Faces Pain Scale:  Hurts even more Pain Location: knees with standing Pain Descriptors / Indicators: Aching, Sore, Grimacing Pain Intervention(s): Monitored during session, Repositioned, Limited activity within patient's tolerance    Home Living Family/patient expects to be discharged to:: Private residence Living Arrangements: Children (Son, [name]) Available Help at Discharge: Family;Available 24 hours/day Type of Home:  House Home Access: Ramped entrance       Home Layout: One level Home Equipment: Rollator (4 wheels);Rolling Walker (2 wheels);BSC/3in1;Shower seat;Wheelchair - manual;Tub bench;Grab bars - toilet;Toilet riser      Prior Function Prior Level of Function : Needs assist       Physical Assist : Mobility (physical);ADLs (physical)     Mobility Comments: Using w/c mainly since admission in November, PT stated coming to home recently and started working on mobility with RW short distance. ADLs Comments: needs min assist for bathing and bathing from family; depends on how fatigued she is. Supervision-Min A for toileting in bathroom. Using BSC at night by bed.     Extremity/Trunk Assessment   Upper Extremity Assessment Upper Extremity Assessment: Defer to OT evaluation    Lower Extremity Assessment Lower Extremity Assessment: RLE deficits/detail;LLE deficits/detail RLE Deficits / Details: knees with obvious arthritic changes, son reports has injections usually about every 3 months and just had them done recently; AAROM WFL, strength hip flexion 3-/5, knee extension 3+/5 RLE Sensation: WNL LLE Deficits / Details: knees with obvious arthritic changes, son reports has injections usually about every 3 months and just had them done recently; AAROM WFL, strength hip flexion 3-/5, knee extension 3+/5 LLE Sensation: WNL    Cervical / Trunk Assessment Cervical / Trunk Assessment: Kyphotic  Communication   Communication Communication: No apparent difficulties  Cognition Arousal: Lethargic Behavior During Therapy: Flat affect Overall Cognitive Status: Impaired/Different from baseline Area of Impairment: Memory, Following commands, Safety/judgement, Awareness, Problem solving, Orientation, Attention                 Orientation Level: Time, Situation Current Attention Level: Selective, Sustained Memory: Decreased short-term memory Following Commands: Follows one step commands with  increased time Safety/Judgement: Decreased awareness of safety, Decreased awareness of deficits Awareness: Intellectual, Emergent Problem Solving: Slow processing General Comments: Alert upon arrival and eating breakfast. Engaging in conversation. Slow to process movement for bed mobility. Requiring cues throughout and increased time. Not oriented to year (2005) and situation. Was able to state "[employer]"; son reports seizure meds make her sleepy        General Comments General comments (skin integrity, edema, etc.): [name], [name], present throughout, BP in supine 131/74 (76) and bacn supine 128/51 (74) after mobility with pt c/o headache (RN aware)    Exercises     Assessment/Plan    PT Assessment Patient needs continued PT services  PT Problem List Decreased strength;Decreased activity tolerance;Decreased balance;Decreased mobility;Decreased safety awareness       PT Treatment Interventions DME instruction;Gait training;Patient/family education;Functional mobility training;Therapeutic activities;Therapeutic exercise;Balance training    PT Goals (Current goals can be found in the Care Plan section)  Acute Rehab PT Goals Patient Stated Goal: return home PT Goal Formulation: With patient/family Time For Goal Achievement: 07/21/23 Potential to Achieve Goals: Fair    Frequency Min 1X/week     Co-evaluation PT/OT/SLP Co-Evaluation/Treatment: Yes Reason for Co-Treatment: For patient/therapist safety;To address functional/ADL transfers PT goals addressed during session: Mobility/safety with mobility;Balance;Strengthening/ROM OT goals addressed during session: ADL's and self-care       AM-PAC PT "6 Clicks" Mobility  Outcome Measure Help needed  turning from your back to your side while in a flat bed without using bedrails?: Total Help needed moving from lying on your back to sitting on the side of a flat bed without using bedrails?: Total Help needed moving to and  from a bed to a chair (including a wheelchair)?: Total Help needed standing up from a chair using your arms (e.g., wheelchair or bedside chair)?: Total Help needed to walk in hospital room?: Total Help needed climbing 3-5 steps with a railing? : Total 6 Click Score: 6    End of Session Equipment Utilized During Treatment: Gait belt Activity Tolerance: Patient limited by fatigue Patient left: in bed;with call bell/phone within reach;with family/visitor present;with bed alarm set   PT Visit Diagnosis: Other abnormalities of gait and mobility (R26.89);Muscle weakness (generalized) (M62.81);Other symptoms and signs involving the nervous system (R29.898)    Time: 1610-9604 PT Time Calculation (min) (ACUTE ONLY): 21 min   Charges:   PT Evaluation $PT Eval Moderate Complexity: 1 Mod   PT General Charges $$ ACUTE PT VISIT: 1 Visit         Sheran Lawless, PT Acute Rehabilitation Services Office:[phone] 07/07/2023   Elray Mcgregor 07/07/2023, 1:26 PM

## 2023-07-07 NOTE — Progress Notes (Signed)
PROGRESS NOTE JENNI SPONAUGLE  NWG:[id number] DOB: October 21, 1932 DOA: 07/05/2023 PCP: Shelva Majestic, MD  Brief Narrative/Hospital Course: 89 y.o. female with medical history significant of dementia largely wheelchair-bound, hypertension, hyperlipidemia, diabetes, COPD, CKD 3, CAD status post stent, epilepsy, diastolic CHF, anxiety, anemia, wheelchair-bound presenting with altered mental status and seizure-family found her.  With active seizure lasting couple of minutes resolving spontaneously. Patient has known history of dementia and seizure disorder with recent suspected breakthrough seizures that lead to admissions in October and November of this year. ED Course: Vitals stable with mild tachycardia tachypnea, labs CMP with bicarb 20, BUN 26, glucose 168.  CBC within normal limits.  Urinalysis with protein, leukocytes, bacteria.  Urine culture SENT CXR>mild vascular congestion CT head>small volume subarachnoid hemorrhage at the right frontal lobe CT C-spine> no acute normality CTA of the head and neck> motion degraded for both but showed no large vessel occlusion or significant stenosis of the head nor no hemodynamically significant stenosis of the neck within the motion degraded limitation. Neurology was consulted , given ceftriaxone,Keppra Ativan and admitted for further management of altered mental status with breakthrough seizure subarachnoid bleed.  Subjective: Patient seen and examined Son at the bedside Patient just waking up-alert awake oriented to self current place month president Overnight afebrile BP 150s-190s, on room air Blood sugar stable On LTM EEG  Assessment and Plan: Active Problems:   Type 2 diabetes mellitus without complication, with long-term current use of insulin (HCC)   Hyperlipidemia associated with type 2 diabetes mellitus (HCC)   Essential hypertension   COPD (chronic obstructive pulmonary disease) (HCC)   Chronic diastolic CHF (congestive heart failure)  (HCC)   CAD- RCA PCI '90s, STEMI-RCA DES 02/18/14   Anxiety state   CKD (chronic kidney disease), stage III (HCC)   Anemia, iron deficiency   Dementia (HCC)   Epilepsy (HCC)   DNR (do not resuscitate)/DNI(Do Not Intubate)   Subarachnoid hemorrhage (HCC)   Seizure (HCC)  Breakthrough seizure with known seizure disorder: Acute toxic metabolic encephalopathy in the background of dementia/anxiety disorder: Mental seems overall improving. On LTM EEG -plan is to discontinue 12/20 if no epileptiform activity.-so far showing intermittent slow, generalized -mild diffuse encephalopathy.  Neurology following closely Resolved.  Continue on Keppra 500 mg twice daily, home Lamictal 50mg  bid  PT OT evaluation pending continue seizure precaution fall precaution and home Seroquel,tca/ssri.  Subarachnoid hemorrhage-small-volume: In the setting of seizure.  Neurology following maintain SBP 130s-150s avoid anticoagulants-resuming home amlodipine, continue Coreg and ACE inhibitor, as needed labetalol iv.  Klebsiella UTI POA: Continue ceftriaxone, C/S reviewed  Hypertension: BP remains uncontrolled, SBP goal 130-150,continue home Coreg and enalapril,, resuming amlodipine> if BP remains uncontrolled can increase/titrate home meds.Continue prn labetalol IV   CAD w./ hx of stents  per son abt 5 yrs ago, and in past 20 yr HLD: Continue statin.  Holding anticoagulants/antiplatelets-PTA was on aspirin Plavix.  Chronic diastolic CHF: Last EF normal 60 to 60%, on Lasix prn PTA.Monitor fluid status.  Diabetes mellitus: Continue SSI, cbg stable as below. Recent Labs  Lab 07/06/23 1120 07/06/23 1702 07/06/23 2340 07/07/23 0340 07/07/23 0742  GLUCAP 88 113* 130* 137* 110*    COPD: Not in exacerbation.  Continue home inhaler  Ambulatory dysfunction with wheelchair-bound, lives with her younger son.  Goals of care: DNR but family okay with intubation if required for seizures.  Will consult palliative  care at some point  DVT prophylaxis: SCDs Start: 07/05/23 1423 Code Status:   Code Status:  Do not attempt resuscitation (DNR) PRE-ARREST INTERVENTIONS DESIRED Family Communication: plan of care discussed with patient/son at bedside. Patient status is: Remains hospitalized because of severity of illness Level of care: Progressive   Dispo: The patient is from: Home w/ son            Anticipated disposition: TBD pending PT OT evaluation and optimization of blood pressure and further neurology clearance  Objective: Vitals last 24 hrs: Vitals:   07/06/23 2342 07/07/23 0341 07/07/23 0724 07/07/23 0741  BP: (!) 156/64 (!) 157/61  (!) 172/71  Pulse: 73 66  66  Resp: 18 18  17   Temp: 98.7 F (37.1 C) 98 F (36.7 C)  98.2 F (36.8 C)  TempSrc: Oral Oral  Oral  SpO2: 95% 99% 96% 99%  Weight:      Height:       Weight change:   Physical Examination: General exam: alert awake, oriented HEENT:Oral mucosa moist, Ear/Nose WNL grossly Respiratory system: Bilaterally clear BS,no use of accessory muscle Cardiovascular system: S1 & S2 +, No JVD. Gastrointestinal system: Abdomen soft,NT,ND, BS+ Nervous System: Alert, awake, moving all extremities and following commands. Extremities: LE edema neg,distal peripheral pulses palpable and warm.  Skin: No rashes,no icterus. MSK: Normal muscle bulk,tone, power   Medications reviewed:  Scheduled Meds:  amLODipine  2.5 mg Oral Daily   carvedilol  12.5 mg Oral BID   enalapril  20 mg Oral QHS   fenofibrate  54 mg Oral Daily   icosapent Ethyl  1 g Oral BID WC   And   icosapent Ethyl  2 g Oral QHS   insulin aspart  0-15 Units Subcutaneous Q4H   lamoTRIgine  50 mg Oral BID   levETIRAcetam  500 mg Oral Q12H   medroxyPROGESTERone  10 mg Oral Daily   mometasone-formoterol  2 puff Inhalation BID   nortriptyline  10 mg Oral QHS   pantoprazole  40 mg Oral Daily   QUEtiapine  25 mg Oral QPM   rosuvastatin  40 mg Oral Daily   sertraline  100 mg Oral  QHS   sodium chloride flush  3 mL Intravenous Q12H   umeclidinium bromide  1 puff Inhalation Daily   Continuous Infusions:  cefTRIAXone (ROCEPHIN)  IV Stopped (07/06/23 1258)   Diet Order             DIET DYS 3 Room service appropriate? Yes with Assist; Fluid consistency: Thin  Diet effective now                  Intake/Output Summary (Last 24 hours) at 07/07/2023 1004 Last data filed at 07/07/2023 0820 Gross per 24 hour  Intake 644.5 ml  Output 350 ml  Net 294.5 ml   Net IO Since Admission: -477.5 mL [07/07/23 1004]  Wt Readings from Last 3 Encounters:  07/05/23 75.8 kg  06/26/23 75.8 kg  06/14/23 76 kg     Unresulted Labs (From admission, onward)    None     Data Reviewed: I have personally reviewed following labs and imaging studies CBC: Recent Labs  Lab 07/05/23 0908 07/06/23 0610  WBC 8.9 10.0  NEUTROABS 6.9  --   HGB 12.7 11.5*  HCT 41.2 36.3  MCV 86.7 85.4  PLT 360 338   Basic Metabolic Panel:  Recent Labs  Lab 07/05/23 0908 07/06/23 0610  NA 138 138  K 4.1 3.6  CL 105 109  CO2 20* 23  GLUCOSE 168* 91  BUN 26* 21  CREATININE  0.84 0.75  CALCIUM 10.1 9.2   GFR: Estimated Creatinine Clearance: 49.5 mL/min (by C-G formula based on SCr of 0.75 mg/dL). Liver Function Tests:  Recent Labs  Lab 07/05/23 0908 07/06/23 0610  AST 25 20  ALT 18 16  ALKPHOS 58 48  BILITOT 0.5 0.5  PROT 7.4 6.7  ALBUMIN 3.6 3.1*   Recent Results (from the past 240 hours)  Urine Culture     Status: Abnormal   Collection Time: 07/05/23 12:17 PM   Specimen: Urine, Clean Catch  Result Value Ref Range Status   Specimen Description URINE, CLEAN CATCH  Final   Special Requests   Final    NONE Performed at [facility], [address] [place], [place] [address]    Culture >=100,000 COLONIES/mL KLEBSIELLA AEROGENES (A)  Final   Report Status 07/07/2023 FINAL  Final   Organism ID, Bacteria KLEBSIELLA AEROGENES (A)  Final      Susceptibility   Klebsiella  aerogenes - MIC*    CEFEPIME <=0.12 SENSITIVE Sensitive     CEFTRIAXONE <=0.25 SENSITIVE Sensitive     CIPROFLOXACIN <=0.25 SENSITIVE Sensitive     GENTAMICIN <=1 SENSITIVE Sensitive     IMIPENEM 2 SENSITIVE Sensitive     NITROFURANTOIN 64 INTERMEDIATE Intermediate     TRIMETH/SULFA <=20 SENSITIVE Sensitive     PIP/TAZO <=4 SENSITIVE Sensitive ug/mL    * >=100,000 COLONIES/mL KLEBSIELLA AEROGENES    Antimicrobials/Microbiology: Anti-infectives (From admission, onward)    Start     Dose/Rate Route Frequency Ordered Stop   07/06/23 1200  cefTRIAXone (ROCEPHIN) 1 g in sodium chloride 0.9 % 100 mL IVPB        1 g 200 mL/hr over 30 Minutes Intravenous Every 24 hours 07/05/23 1427     07/05/23 1230  cefTRIAXone (ROCEPHIN) 1 g in sodium chloride 0.9 % 100 mL IVPB        1 g 200 mL/hr over 30 Minutes Intravenous  Once 07/05/23 1216 07/05/23 1324         Component Value Date/Time   SDES URINE, CLEAN CATCH 07/05/2023 1217   SPECREQUEST  07/05/2023 1217    NONE Performed at [facility], [address] [place], [place] [address]    CULT >=100,000 COLONIES/mL KLEBSIELLA AEROGENES (A) 07/05/2023 1217   REPTSTATUS 07/07/2023 FINAL 07/05/2023 1217     Radiology Studies: Overnight EEG with video Result Date: 07/06/2023 Charlsie Quest, MD     07/06/2023  6:08 AM Patient Name: DAVA EPPLER MRN: [id number] Epilepsy Attending: Charlsie Quest Referring Physician/Provider: Marjorie Smolder, NP Duration: 07/05/2023 1319 to 07/06/2023 0600  Patient history: 89yo F with history of recent admission for breakthrough seizures with prior history of seizures, diabetes, COPD, modified Rankin score of 4 at baseline, presenting with concern for depressed mental status after a staring spell this morning concerning for seizure. EEG to evaluate for seizure  Level of alertness: Awake, asleep  AEDs during EEG study: LTG, LEV  Technical aspects: This EEG study was done with scalp electrodes  positioned according to the 10-20 International system of electrode placement. Electrical activity was reviewed with band pass filter of 1-70Hz , sensitivity of 7 uV/mm, display speed of 13mm/sec with a 60Hz  notched filter applied as appropriate. EEG data were recorded continuously and digitally stored.  Video monitoring was available and reviewed as appropriate.  Description: The posterior dominant rhythm consists of 8 Hz activity of moderate voltage (25-35 uV) seen predominantly in posterior head regions,  symmetric and reactive to eye opening and eye closing. EEG showed intermittent generalized 3 to 6 Hz theta-delta slowing as well as generalized periodic discharges with triphasic morphology at 1hz , when awake/stimulated. Hyperventilation and photic stimulation were not performed.    ABNORMALITY - Periodic discharges with triphasic morphology, generalized p - Intermittent slow, generalized  IMPRESSION: This study is suggestive of mild diffuse encephalopathy, likely secondary to toxic-metabolic causes. No seizures or epileptiform discharges were seen throughout the recording.  Priyanka Annabelle Harman   CT ANGIO HEAD W OR WO CONTRAST Result Date: 07/05/2023 CLINICAL DATA:  Subarachnoid hemorrhage and seizures EXAM: CT ANGIOGRAPHY HEAD TECHNIQUE: Multidetector CT imaging of the head was performed using the standard protocol during bolus administration of intravenous contrast. Multiplanar CT image reconstructions and MIPs were obtained to evaluate the vascular anatomy. RADIATION DOSE REDUCTION: This exam was performed according to the departmental dose-optimization program which includes automated exposure control, adjustment of the mA and/or kV according to patient size and/or use of iterative reconstruction technique. CONTRAST:  75mL OMNIPAQUE IOHEXOL 350 MG/ML SOLN COMPARISON:  CTA head neck 07/05/2023 at 12:31 p.m. FINDINGS: POSTERIOR CIRCULATION: Vertebral arteries are normal. No proximal occlusion of the anterior or  inferior cerebellar arteries. Basilar artery is normal. Superior cerebellar arteries are normal. Posterior cerebral arteries are normal. ANTERIOR CIRCULATION: Intracranial internal carotid arteries are normal. Anterior cerebral arteries are normal. Middle cerebral arteries are normal. Anatomic Variants: None Venous sinuses: As permitted by contrast timing, patent. Other: Redemonstration of small volume subarachnoid hemorrhage. Review of the MIP images confirms the above findings. IMPRESSION: 1. No intracranial aneurysm or vascular malformation. 2. Redemonstration of small volume subarachnoid hemorrhage. Electronically Signed   By: Deatra Robinson M.D.   On: 07/05/2023 22:50   CT ANGIO HEAD NECK W WO CM Result Date: 07/05/2023 CLINICAL DATA:  Subarachnoid hemorrhage EXAM: CT ANGIOGRAPHY HEAD AND NECK WITH AND WITHOUT CONTRAST TECHNIQUE: Multidetector CT imaging of the head and neck was performed using the standard protocol during bolus administration of intravenous contrast. Multiplanar CT image reconstructions and MIPs were obtained to evaluate the vascular anatomy. Carotid stenosis measurements (when applicable) are obtained utilizing NASCET criteria, using the distal internal carotid diameter as the denominator. RADIATION DOSE REDUCTION: This exam was performed according to the departmental dose-optimization program which includes automated exposure control, adjustment of the mA and/or kV according to patient size and/or use of iterative reconstruction technique. CONTRAST:  50mL OMNIPAQUE IOHEXOL 350 MG/ML SOLN COMPARISON:  06/14/2023 CTA head and neck, correlation is also made with 07/05/2023 CT head FINDINGS: CT HEAD FINDINGS For noncontrast findings, please see same day CT head. CTA NECK FINDINGS Evaluation is limited by motion artifact. Aortic arch: Standard branching. Imaged portion shows no evidence of aneurysm or dissection. No significant stenosis of the major arch vessel origins. Aortic atherosclerosis  with ulcerated plaque. Right carotid system: No evidence of dissection, occlusion, or hemodynamically significant stenosis (greater than 50%). Left carotid system: No evidence of dissection, occlusion, or hemodynamically significant stenosis (greater than 50%). Vertebral arteries: No evidence of dissection, occlusion, or hemodynamically significant stenosis (greater than 50%). Skeleton: No acute osseous abnormality. Degenerative changes in the cervical spine. Other neck: No acute finding. Upper chest: No focal pulmonary opacity or pleural effusion. Apical pleural-parenchymal scarring. Review of the MIP images confirms the above findings CTA HEAD FINDINGS Evaluation is significantly motion degraded. Within this limitation, the intracranial carotid arteries are patent to the termini. The proximal MCAs and ACAs appear patent, although evaluation of the distal vessels is not possible.  The vertebral arteries are patent to the vertebrobasilar junction without significant stenosis. The PICAs appear patent proximally. The basilar artery is patent to its distal aspect without significant stenosis. The PCA s are patent proximally. Evaluation of more distal PCAs is not possible. IMPRESSION: 1. Evaluation of the intracranial vasculature is significantly motion degraded. Within this limitation, no large vessel occlusion or significant stenosis in the proximal vessels of the head. 2. Evaluation of the neck vasculature is mildly motion degraded. Within this limitation, no hemodynamically significant stenosis. 3. Aortic atherosclerosis with ulcerated plaque. Aortic Atherosclerosis (ICD10-I70.0). Electronically Signed   By: Wiliam Ke M.D.   On: 07/05/2023 13:21   CT HEAD WO CONTRAST ( ) Result Date: 07/05/2023 CLINICAL DATA:  Polytrauma, blunt EXAM: CT HEAD WITHOUT CONTRAST CT CERVICAL SPINE WITHOUT CONTRAST TECHNIQUE: Multidetector CT imaging of the head and cervical spine was performed following the standard protocol  without intravenous contrast. Multiplanar CT image reconstructions of the cervical spine were also generated. RADIATION DOSE REDUCTION: This exam was performed according to the departmental dose-optimization program which includes automated exposure control, adjustment of the mA and/or kV according to patient size and/or use of iterative reconstruction technique. COMPARISON:  CT Head and C Spine 03/01/23 FINDINGS: Limitations: Motion degraded exam CT HEAD FINDINGS Brain: Small volume subarachnoid hemorrhage in the right frontal lobe (series 7, image 27). No hydrocephalus. No evidence of intraventricular hemorrhage. No CT evidence of an acute cortical infarct. No mass effect. No mass lesion. Vascular: No hyperdense vessel or unexpected calcification. Skull: Normal. Negative for fracture or focal lesion. Sinuses/Orbits: No middle ear or mastoid effusion. Paranasal sinuses are clear. Orbits are unremarkable. Other: None. CT CERVICAL SPINE FINDINGS Alignment: Grade 1 anterolisthesis of C6 on C7 Skull base and vertebrae: No acute fracture. No primary bone lesion or focal pathologic process. Unchanged likely degenerative fusion of C6-C7 Soft tissues and spinal canal: No prevertebral fluid or swelling. No visible canal hematoma. Disc levels:  No CT evidence of high-grade spinal canal stenosis Upper chest: Negative. Other: 1.2 cm left thyroid nodule. This is not meet size criteria for further imaging workup IMPRESSION: 1. Small volume subarachnoid hemorrhage in the right frontal lobe. 2. No acute fracture or traumatic subluxation of the cervical spine. Electronically Signed   By: Lorenza Cambridge M.D.   On: 07/05/2023 10:30   CT Cervical Spine Wo Contrast Result Date: 07/05/2023 CLINICAL DATA:  Polytrauma, blunt EXAM: CT HEAD WITHOUT CONTRAST CT CERVICAL SPINE WITHOUT CONTRAST TECHNIQUE: Multidetector CT imaging of the head and cervical spine was performed following the standard protocol without intravenous contrast.  Multiplanar CT image reconstructions of the cervical spine were also generated. RADIATION DOSE REDUCTION: This exam was performed according to the departmental dose-optimization program which includes automated exposure control, adjustment of the mA and/or kV according to patient size and/or use of iterative reconstruction technique. COMPARISON:  CT Head and C Spine 03/01/23 FINDINGS: Limitations: Motion degraded exam CT HEAD FINDINGS Brain: Small volume subarachnoid hemorrhage in the right frontal lobe (series 7, image 27). No hydrocephalus. No evidence of intraventricular hemorrhage. No CT evidence of an acute cortical infarct. No mass effect. No mass lesion. Vascular: No hyperdense vessel or unexpected calcification. Skull: Normal. Negative for fracture or focal lesion. Sinuses/Orbits: No middle ear or mastoid effusion. Paranasal sinuses are clear. Orbits are unremarkable. Other: None. CT CERVICAL SPINE FINDINGS Alignment: Grade 1 anterolisthesis of C6 on C7 Skull base and vertebrae: No acute fracture. No primary bone lesion or focal pathologic process. Unchanged likely degenerative fusion of  C6-C7 Soft tissues and spinal canal: No prevertebral fluid or swelling. No visible canal hematoma. Disc levels:  No CT evidence of high-grade spinal canal stenosis Upper chest: Negative. Other: 1.2 cm left thyroid nodule. This is not meet size criteria for further imaging workup IMPRESSION: 1. Small volume subarachnoid hemorrhage in the right frontal lobe. 2. No acute fracture or traumatic subluxation of the cervical spine. Electronically Signed   By: Lorenza Cambridge M.D.   On: 07/05/2023 10:30    LOS: 1 day   Total time spent in review of labs and imaging, patient evaluation, formulation of plan, documentation and communication with family: 50  minutes  Lanae Boast, MD [facility]  07/07/2023, 10:04 AM

## 2023-07-07 NOTE — Progress Notes (Signed)
OT Cancellation Note  Patient Details Name: Michelle [name] MRN: [id number] DOB: [birth year]   Cancelled Treatment:    Reason Eval/Treat Not Completed: Medical issues which prohibited therapy. NT taking morning vitals and BP 172/71 in supine. Will hold OT  eval at this time and return as schedule allows.   Casi Westerfeld M Fatumata Kashani Jolane Bankhead MSOT, OTR/L Acute Rehab Office: [phone] 07/07/2023, 7:43 AM

## 2023-07-07 NOTE — Progress Notes (Signed)
NEUROLOGY CONSULT FOLLOW UP NOTE   Date of service: July 07, 2023 Patient Name: Michelle [name] MRN:  [id number] DOB:  April 10, 1933  Brief HPI  TRACYANN WASSMUTH is a 89 y.o. female with PMH significant for HTN, HDL, IDDM, MI, s/p coronary DES placement (2015), COPD who presented to [facility] 12/18 due to witnessed breakthrough seizure. Family reported that paitnet was in bed and had jerking BUE movements that lasted for several minutes stopped by intranasal rescue medication. She had a second seizure in the ED after lorazepam administration. CTH/CTA revealed a small right frontal SAH concerning for possible amyloid etiology.     Interval Hx/subjective   TM negative for epileptogenic activity, will discontinue.  Patient is drowsy but oriented to self, place,   Vitals   Vitals:   07/06/23 2342 07/07/23 0341 07/07/23 0724 07/07/23 0741  BP: (!) 156/64 (!) 157/61  (!) 172/71  Pulse: 73 66  66  Resp: 18 18  17   Temp: 98.7 F (37.1 C) 98 F (36.7 C)  98.2 F (36.8 C)  TempSrc: Oral Oral  Oral  SpO2: 95% 99% 96% 99%  Weight:      Height:        Body mass index is 27.81 kg/m.  Physical Exam   Constitutional: Frail, chronically ill-appearing elderly patient in no acute distress. LTM EEG monitoring in place.  Eyes: No scleral injection.  HENT: No OP obstruction.  Head: Normocephalic.  Cardiovascular: Normal rate and regular rhythm.  Respiratory: Effort normal, non-labored breathing on supplemental O2 with nasal cannula dislodged from her nose on exam this morning Skin: WDI.   Neurologic Examination  Mental Status: Patient is awake but drowsy. She is oriented to self, age, place, year. When asked the month, she says, "I dont know" but is able to tell me the month that Christmas is in.  She remembers coming into the hospital but does not remember having any seizures or any events leading up to that.  She does follow commands.  She is able to name some simple objects (pen, thumb) but  poor attention and concentration.  Cranial Nerves: II: Does not fully participate in visual field testing, no overt deficit seen. PERRL III,IV, VI: EOMI, no ptosis seen, no diplopia reported.  V: Facial sensation is intact and symmetric to light touch VII: Facial movement is symmetric resting and with movement VIII: Hearing is intact to voice X: Phonation intact XI: Head moves to the left and right equally XII: Tongue midline Motor: BUE: 4/5 bicep/tricep, 4+/5 distal 4+/5 grip BLE: 4-/5 proximal, 4+/5 distal, 4/5 doral flexion, 4+/5 plantar flexion No drift in any extremity Sensory: Patient reacts equally to light touch throughout Cerebellar: Does not perform  Medications  Current Facility-Administered Medications:    acetaminophen (TYLENOL) tablet 650 mg, 650 mg, Oral, Q6H PRN **OR** acetaminophen (TYLENOL) suppository 650 mg, 650 mg, Rectal, Q6H PRN, Synetta Fail, MD   albuterol (PROVENTIL) (2.5 MG/3ML) 0.083% nebulizer solution 2.5 mg, 2.5 mg, Inhalation, Q4H PRN, Synetta Fail, MD   amLODipine (NORVASC) tablet 2.5 mg, 2.5 mg, Oral, Daily, Kc, Ramesh, MD   carvedilol (COREG) tablet 12.5 mg, 12.5 mg, Oral, BID, Beola Cord B, MD, 12.5 mg at 07/06/23 2212   cefTRIAXone (ROCEPHIN) 1 g in sodium chloride 0.9 % 100 mL IVPB, 1 g, Intravenous, Q24H, Synetta Fail, MD, Stopped at 07/06/23 1258   enalapril (VASOTEC) tablet 20 mg, 20 mg, Oral, QHS, Synetta Fail, MD, 20 mg at 07/06/23 2213   icosapent  Ethyl (VASCEPA) 1 g capsule 1 g, 1 g, Oral, BID WC, 1 g at 07/06/23 1720 **AND** icosapent Ethyl (VASCEPA) 1 g capsule 2 g, 2 g, Oral, QHS, Jardin, Carla G, RPH, 2 g at 07/06/23 2212   insulin aspart (novoLOG) injection 0-15 Units, 0-15 Units, Subcutaneous, Q4H, Synetta Fail, MD, 2 Units at 07/07/23 0346   labetalol (NORMODYNE) injection 5 mg, 5 mg, Intravenous, Q2H PRN, Synetta Fail, MD, 5 mg at 07/06/23 1725   lamoTRIgine (LAMICTAL) tablet 50 mg, 50  mg, Oral, BID, Synetta Fail, MD, 50 mg at 07/06/23 2212   levETIRAcetam (KEPPRA) IVPB 500 mg/100 mL premix, 500 mg, Intravenous, Q12H, Synetta Fail, MD, Last Rate: 400 mL/hr at 07/06/23 2228, 500 mg at 07/06/23 2228   loperamide (IMODIUM) capsule 2 mg, 2 mg, Oral, BID PRN, Synetta Fail, MD   LORazepam (ATIVAN) injection 2 mg, 2 mg, Intravenous, Q5 Min x 2 PRN, Synetta Fail, MD   medroxyPROGESTERone (PROVERA) tablet 10 mg, 10 mg, Oral, Daily, Synetta Fail, MD, 10 mg at 07/06/23 1117   mometasone-formoterol (DULERA) 200-5 MCG/ACT inhaler 2 puff, 2 puff, Inhalation, BID, Synetta Fail, MD, 2 puff at 07/07/23 0724   pantoprazole (PROTONIX) EC tablet 40 mg, 40 mg, Oral, Daily, Synetta Fail, MD, 40 mg at 07/06/23 1116   polyethylene glycol (MIRALAX / GLYCOLAX) packet 17 g, 17 g, Oral, Daily PRN, Synetta Fail, MD   QUEtiapine (SEROQUEL) tablet 25 mg, 25 mg, Oral, QPM, Synetta Fail, MD, 25 mg at 07/06/23 1723   rosuvastatin (CRESTOR) tablet 40 mg, 40 mg, Oral, Daily, Synetta Fail, MD, 40 mg at 07/06/23 1116   sodium chloride flush (NS) 0.9 % injection 3 mL, 3 mL, Intravenous, Q12H, Synetta Fail, MD, 3 mL at 07/06/23 2213   umeclidinium bromide (INCRUSE ELLIPTA) 62.5 MCG/ACT 1 puff, 1 puff, Inhalation, Daily, Synetta Fail, MD, 1 puff at 07/07/23 0724 Labs and Diagnostic Imaging   CBC:  Recent Labs  Lab 07/05/23 0908 07/06/23 0610  WBC 8.9 10.0  NEUTROABS 6.9  --   HGB 12.7 11.5*  HCT 41.2 36.3  MCV 86.7 85.4  PLT 360 338   Basic Metabolic Panel:  Lab Results  Component Value Date   NA 138 07/06/2023   K 3.6 07/06/2023   CO2 23 07/06/2023   GLUCOSE 91 07/06/2023   BUN 21 07/06/2023   CREATININE 0.75 07/06/2023   CALCIUM 9.2 07/06/2023   GFRNONAA >60 07/06/2023   GFRAA 77 07/01/2020   Lipid Panel:  Lab Results  Component Value Date   LDLCALC 32 08/19/2022   HgbA1c:  Lab Results  Component Value Date    HGBA1C 6.4 06/12/2023   Urine Drug Screen:     Component Value Date/Time   LABOPIA NONE DETECTED 06/14/2023 1545   COCAINSCRNUR NONE DETECTED 06/14/2023 1545   LABBENZ NONE DETECTED 06/14/2023 1545   AMPHETMU NONE DETECTED 06/14/2023 1545   THCU NONE DETECTED 06/14/2023 1545   LABBARB NONE DETECTED 06/14/2023 1545    Alcohol Level     Component Value Date/Time   ETH <10 06/14/2023 1630   INR  Lab Results  Component Value Date   INR 1.2 06/15/2023   APTT  Lab Results  Component Value Date   APTT 29 06/15/2023   AED levels:  Lab Results  Component Value Date   LAMOTRIGINE <1.0 (L) 05/17/2023   CT Head without contrast(Personally reviewed): Small volume subarachnoid hemorrhage in the right frontal  lobe   CT angio Head and Neck with contrast(Personally reviewed): Motion limited study with no LVO or significant stenosis in the proximal vessels, no aneurysm seen, but again study is motion limited  Repeat CT angio head: 1. No intracranial aneurysm or vascular malformation. 2. Redemonstration of small volume subarachnoid hemorrhage.  cEEG 12/18 - 12/19:  "This study is suggestive of mild diffuse encephalopathy, likely secondary to toxic-metabolic causes. No seizures or epileptiform discharges were seen throughout the recording."  Assessment   JERIS PALADINO is a 89 y.o. 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  Patient had another witnessed seizure while in the ED s/p Ativan.  CTH imaging revealed a small right frontal lobe SAH that is nontraumatic and non-aneurysmal with imaging concerning for underlying amyloid angiopathy etiology. Patient was admitted  for cEEG monitoring overnight.  Break-through seizure in patient with known seizure disorder secondary to acute toxic-metabolic encephalopathy compounded by history of dementia.   Recommendations   - Discontinue LTM EEG - Continue seizure precautions - Continue Keppra 500 mg BID (changed to PO) - Continue home LMTG  - Outpatient providers may want to increase her lamictal and wean off Keppra as the Keppra was started in the hospital due to need for IV medication due to patient unable to take PO.   - SBP 130-150 - No anticoagulation, pharmacologic DVT prophylaxis, or antiplatelets in setting of SAH   SEIZURE PRECAUTIONS Per [facility] statutes, patients with seizures are not allowed to drive until they have been seizure-free for six months.   Use caution when using heavy equipment or power tools. Avoid working on ladders or at heights. Take showers instead of baths. Ensure the water temperature is not too high on the home water heater. Do not go swimming alone. Do not lock yourself in a room alone (i.e. bathroom). When caring for infants or small children, sit down when holding, feeding, or changing them to minimize risk of injury to the child in the event you have a seizure. Maintain good sleep hygiene. Avoid alcohol.    If patient has another seizure, call 911 and bring them back to the ED if: A.  The seizure lasts longer than 5 minutes.      B.  The patient doesn't wake shortly after the seizure or has new problems such as difficulty seeing, speaking or moving following the seizure C.  The patient was injured during the seizure D.  The patient has a temperature over 102 F (39C) E.  The patient vomited during the seizure and now is having trouble breathing  Neurology will sign off. Please recall with any further needs or neurological events. Thank you for this consult.  _____________________________________________________________ Pt seen by Neuro NP/APP and later by MD.  Note/plan to be edited by MD as needed.    Lynnae January, DNP, AGACNP-BC [facility] Please use AMION for contact information & EPIC for messaging.

## 2023-07-08 DIAGNOSIS — R569 Unspecified convulsions: Secondary | ICD-10-CM

## 2023-07-08 LAB — GLUCOSE, CAPILLARY
Glucose-Capillary: 144 mg/dL — ABNORMAL HIGH (ref 70–99)
Glucose-Capillary: 117 mg/dL — ABNORMAL HIGH (ref 70–99)
Glucose-Capillary: 163 mg/dL — ABNORMAL HIGH (ref 70–99)
Glucose-Capillary: 151 mg/dL — ABNORMAL HIGH (ref 70–99)
Glucose-Capillary: 168 mg/dL — ABNORMAL HIGH (ref 70–99)

## 2023-07-08 MED ORDER — AMLODIPINE BESYLATE 5 MG PO TABS
5.0000 mg | ORAL_TABLET | Freq: Every day | ORAL | Status: DC
Start: 2023-07-08 — End: 2023-07-10
  Administered 2023-07-09: 5 mg via ORAL
  Filled 2023-07-08: qty 1

## 2023-07-08 MED ORDER — AMLODIPINE BESYLATE 5 MG PO TABS
10.0000 mg | ORAL_TABLET | Freq: Every day | ORAL | Status: DC
Start: 2023-07-08 — End: 2023-07-08
  Administered 2023-07-08: 10 mg via ORAL
  Filled 2023-07-08: qty 2

## 2023-07-08 NOTE — Progress Notes (Signed)
Physical Therapy Treatment Patient Details Name: Michelle [name] MRN: [id number] DOB: 1932-11-23 Today's Date: 07/08/2023   History of Present Illness 89 yo female presenting with AMS and seizures on 07/05/23. CT head showing Small volume subarachnoid hemorrhage in the right frontal lobe. PMH including dementia, hypertension, hyperlipidemia, diabetes, COPD, CKD 3, CAD status post stent, epilepsy, diastolic CHF, anxiety, anemia.    PT Comments  Pt with good progress today.  Able to transfer to EOB with mod A and mod A stand pivot to chair but fatigued quickly and with knee pain.  Tolerated sitting EOB and exercises for at least 15 mins.  Still with confusion requiring increased cues/time.  Pt would benefit from SNF at d/c but no longer has covered rehab days and is returning home.  Recommend max HH services.     If plan is discharge home, recommend the following: Two people to help with walking and/or transfers;Two people to help with bathing/dressing/bathroom;Supervision due to cognitive status;Assist for transportation;Assistance with cooking/housework;Help with stairs or ramp for entrance   Can travel by private vehicle     No  Equipment Recommendations  None recommended by PT    Recommendations for Other Services       Precautions / Restrictions Precautions Precautions: Fall     Mobility  Bed Mobility Overal bed mobility: Needs Assistance Bed Mobility: Supine to Sit     Supine to sit: Mod assist     General bed mobility comments: Increased time with cues for bringing legs over then mod A to lift and scoot forward    Transfers Overall transfer level: Needs assistance Equipment used: Rolling walker (2 wheels) Transfers: Sit to/from Stand Sit to Stand: Mod assist Stand pivot transfers: Mod assist         General transfer comment: STS x 2 with increased time to rise and mod A.  Stand pivot to chair toward R side - fatigued easily and needing to sit quickly but able to  control with assist    Ambulation/Gait               General Gait Details: unable at this time   Stairs             Wheelchair Mobility     Tilt Bed    Modified Rankin (Stroke Patients Only)       Balance Overall balance assessment: Needs assistance Sitting-balance support: No upper extremity supported, Feet supported Sitting balance-Leahy Scale: Fair Sitting balance - Comments: Initially leaning to right and slumped posture, with time and cues able to correct to midline.  Does tend to slump but could correct with cues.  EOB at least 15 mins with supervision   Standing balance support: Bilateral upper extremity supported Standing balance-Leahy Scale: Poor Standing balance comment: Needs RW and min A static; mod A during pivot                            Cognition Arousal: Lethargic Behavior During Therapy: WFL for tasks assessed/performed Overall Cognitive Status: Impaired/Different from baseline                   Orientation Level: Disoriented to, Place, Time, Situation Current Attention Level: Selective Memory: Decreased short-term memory Following Commands: Follows one step commands consistently, Follows multi-step commands inconsistently   Awareness: Emergent Problem Solving: Slow processing, Requires tactile cues, Requires verbal cues General Comments: Son reports she is more confused.  She was lethargic but arousable-  improved thourhgout session        Exercises General Exercises - Upper Extremity Shoulder Flexion: AROM, Left, AAROM, Right, 5 reps, Supine General Exercises - Lower Extremity Ankle Circles/Pumps: AROM, Both, 20 reps, Seated Quad Sets: AROM, Both, 5 reps, Supine Long Arc Quad: AROM, Both, 20 reps, Seated Heel Slides: AROM, Both, 5 reps, Supine Hip Flexion/Marching: AROM, Both, 5 reps, Seated Other Exercises Other Exercises: punching forward x 5 bil UE AROM in sitting Other Exercises: cues for full ROM     General Comments General comments (skin integrity, edema, etc.): Pt's son present- reports plan to return home b/c no longer have rehab days and would be self pay      Pertinent Vitals/Pain Pain Assessment Pain Assessment: No/denies pain    Home Living                          Prior Function            PT Goals (current goals can now be found in the care plan section) Progress towards PT goals: Progressing toward goals    Frequency    Min 1X/week      PT Plan      Co-evaluation              AM-PAC PT "6 Clicks" Mobility   Outcome Measure  Help needed turning from your back to your side while in a flat bed without using bedrails?: A Little Help needed moving from lying on your back to sitting on the side of a flat bed without using bedrails?: A Lot Help needed moving to and from a bed to a chair (including a wheelchair)?: A Lot Help needed standing up from a chair using your arms (e.g., wheelchair or bedside chair)?: A Lot Help needed to walk in hospital room?: Total Help needed climbing 3-5 steps with a railing? : Total 6 Click Score: 11    End of Session Equipment Utilized During Treatment: Gait belt Activity Tolerance: Patient tolerated treatment well (limitations with ambulation due to knee arthritis) Patient left: with chair alarm set;in chair;with call bell/phone within reach Nurse Communication: Mobility status PT Visit Diagnosis: Other abnormalities of gait and mobility (R26.89);Muscle weakness (generalized) (M62.81);Other symptoms and signs involving the nervous system (R29.898)     Time: 4403-4742 PT Time Calculation (min) (ACUTE ONLY): 37 min  Charges:    $Therapeutic Exercise: 8-22 mins $Therapeutic Activity: 8-22 mins PT General Charges $$ ACUTE PT VISIT: 1 Visit                     Anise Salvo, PT Acute Rehab [facility] [phone]    Rayetta Humphrey 07/08/2023, 4:09 PM

## 2023-07-08 NOTE — Plan of Care (Signed)
  Problem: Nutritional: Goal: Maintenance of adequate nutrition will improve Outcome: Progressing Goal: Progress toward achieving an optimal weight will improve Outcome: Progressing   Problem: Skin Integrity: Goal: Risk for impaired skin integrity will decrease Outcome: Progressing   Problem: Clinical Measurements: Goal: Ability to maintain clinical measurements within normal limits will improve Outcome: Progressing Goal: Will remain free from infection Outcome: Progressing Goal: Diagnostic test results will improve Outcome: Progressing Goal: Respiratory complications will improve Outcome: Progressing Goal: Cardiovascular complication will be avoided Outcome: Progressing   Problem: Elimination: Goal: Will not experience complications related to bowel motility Outcome: Progressing Goal: Will not experience complications related to urinary retention Outcome: Progressing   Problem: Skin Integrity: Goal: Risk for impaired skin integrity will decrease Outcome: Progressing   Problem: Safety: Goal: Ability to remain free from injury will improve Outcome: Progressing

## 2023-07-08 NOTE — Progress Notes (Signed)
PROGRESS NOTE FIA MCMURRY  DGU:[id number] DOB: Feb 15, 1933 DOA: 07/05/2023 PCP: Shelva Majestic, MD  Brief Narrative/Hospital Course: 89 y.o. female with medical history significant of dementia largely wheelchair-bound, hypertension, hyperlipidemia, diabetes, COPD, CKD 3, CAD status post stent, epilepsy, diastolic CHF, anxiety, anemia, wheelchair-bound presenting with altered mental status and seizure-family found her.  With active seizure lasting couple of minutes resolving spontaneously. Patient has known history of dementia and seizure disorder with recent suspected breakthrough seizures that lead to admissions in October and November of this year. ED Course: Vitals stable with mild tachycardia tachypnea, labs CMP with bicarb 20, BUN 26, glucose 168.  CBC within normal limits.  Urinalysis with protein, leukocytes, bacteria.  Urine culture SENT CXR>mild vascular congestion CT head>small volume subarachnoid hemorrhage at the right frontal lobe CT C-spine> no acute normality CTA of the head and neck> motion degraded for both but showed no large vessel occlusion or significant stenosis of the head nor no hemodynamically significant stenosis of the neck within the motion degraded limitation. Neurology was consulted , given ceftriaxone,Keppra Ativan and admitted for further management of altered mental status with breakthrough seizure subarachnoid bleed. LTM EEG no seizure, slow waves. Likely she had breakthrough seizure with known seizure disorder secondary to acute toxic metabolic encephalopathy compounded by history of dementia, seen by neurology-placed on Keppra-on follow-up outpatient provider could consider increasing her Lamictal and wean off Keppra as outpatient. BP medication adjusted to optimize blood pressure. PT OT recommended home health.  Subjective:  Seen and examined Mild confusion Son at bedside BP in 120s this  Assessment and Plan: Active Problems:   Type 2 diabetes  mellitus without complication, with long-term current use of insulin (HCC)   Hyperlipidemia associated with type 2 diabetes mellitus (HCC)   Essential hypertension   COPD (chronic obstructive pulmonary disease) (HCC)   Chronic diastolic CHF (congestive heart failure) (HCC)   CAD- RCA PCI '90s, STEMI-RCA DES 02/18/14   Anxiety state   CKD (chronic kidney disease), stage III (HCC)   Anemia, iron deficiency   Dementia (HCC)   Epilepsy (HCC)   DNR (do not resuscitate)/DNI(Do Not Intubate)   Subarachnoid hemorrhage (HCC)   Seizure (HCC)  Breakthrough seizure with known seizure disorder: Acute toxic metabolic encephalopathy in the background of dementia/anxiety disorder: Mental seems overall improving. On LTM EEG -plan is to discontinue 12/20 if no epileptiform activity.-so far showing intermittent slow, generalized -mild diffuse encephalopathy.  Neurology fs/o -on follow-up Continue on Keppra 500 mg twice daily, home Lamictal 50mg  bid outpatient provider could consider increasing her Lamictal and wean off Keppra as outpatient. Son would like more PTOT before d.c . Continue seizure precaution fall precaution and home Seroquel,tca/ssri.  SAH-small-volume: In the setting of seizure.Neurology following maintain SBP 130s-150s avoid anticoagulants- increase Amlodipine to 5 mg. continue Coreg and ACE inhibitor, she take prn lasix and hydralazine at home  Klebsiella UTI POA: Continue ceftriaxone. C/S reviewed on dc can do keflex.  Hypertension: BP remains uncontrolled, SBP goal 130-150,continue home Coreg and enalapril, amlodipine up at 5 mg. Cont prn Labetalol IV   CAD w./ hx of stents  per son abt 5 yrs ago, and in past 20 yr HLD: Continue statin.  Holding anticoagulants/antiplatelets-PTA was on aspirin Plavix.  Chronic diastolic CHF: Last EF normal 60 to 60%, on Lasix prn PTA.Monitor fluid status.  Diabetes mellitus: Continue SSI, cbg stable as below. Recent Labs  Lab 07/07/23 1613  07/07/23 1938 07/07/23 2326 07/08/23 0403 07/08/23 0804  GLUCAP 113* 179* 140* 144*  117*    COPD: Not in exacerbation.  Continue home inhaler  Ambulatory dysfunction with wheelchair-bound, lives with her younger son.  Goals of care: DNR but family okay with intubation if required for seizures.  Will consult palliative care at some point  DVT prophylaxis: SCDs Start: 07/05/23 1423 Code Status:   Code Status: Do not attempt resuscitation (DNR) PRE-ARREST INTERVENTIONS DESIRED Family Communication: plan of care discussed with patient/son at bedside. Patient status is: Remains hospitalized because of severity of illness Level of care: Progressive   Dispo: The patient is from: Home w/ son            Anticipated disposition: more ptot today. Plan for HHPTOT 1-2 days  Objective: Vitals last 24 hrs: Vitals:   07/08/23 0519 07/08/23 0645 07/08/23 0736 07/08/23 0858  BP: (!) 168/73 (!) 187/68 (!) 129/97   Pulse: 66 68 65   Resp: 18 18 18    Temp:   98.3 F (36.8 C)   TempSrc:   Oral   SpO2: 97% 96% 97% 96%  Weight:      Height:       Weight change:   Physical Examination: General exam: alert awake, oriented x place people president HEENT:Oral mucosa moist, Ear/Nose WNL grossly Respiratory system: Bilaterally clear BS,no use of accessory muscle Cardiovascular system: S1 & S2 +, No JVD. Gastrointestinal system: Abdomen soft,NT,ND, BS+ Nervous System: Alert, awake, moving all extremities,and following commands. Extremities: LE edema neg,distal peripheral pulses palpable and warm.  Skin: No rashes,no icterus. MSK: Normal muscle bulk,tone, power   Medications reviewed:  Scheduled Meds:  amLODipine  5 mg Oral Daily   carvedilol  12.5 mg Oral BID   enalapril  20 mg Oral QHS   fenofibrate  54 mg Oral Daily   icosapent Ethyl  1 g Oral BID WC   And   icosapent Ethyl  2 g Oral QHS   insulin aspart  0-15 Units Subcutaneous Q4H   lamoTRIgine  50 mg Oral BID   levETIRAcetam  500  mg Oral Q12H   medroxyPROGESTERone  10 mg Oral Daily   mometasone-formoterol  2 puff Inhalation BID   nortriptyline  10 mg Oral QHS   pantoprazole  40 mg Oral Daily   QUEtiapine  25 mg Oral QPM   rosuvastatin  40 mg Oral Daily   sertraline  100 mg Oral QHS   sodium chloride flush  3 mL Intravenous Q12H   umeclidinium bromide  1 puff Inhalation Daily  Continuous Infusions:  cefTRIAXone (ROCEPHIN)  IV 1 g (07/07/23 1305)   Diet Order             DIET DYS 3 Room service appropriate? Yes with Assist; Fluid consistency: Thin  Diet effective now                  Intake/Output Summary (Last 24 hours) at 07/08/2023 0957 Last data filed at 07/08/2023 0404 Gross per 24 hour  Intake 3 ml  Output 950 ml  Net -947 ml   Net IO Since Admission: -1,424.5 mL [07/08/23 0957]  Wt Readings from Last 3 Encounters:  07/05/23 75.8 kg  06/26/23 75.8 kg  06/14/23 76 kg     Unresulted Labs (From admission, onward)    None     Data Reviewed: I have personally reviewed following labs and imaging studies CBC: Recent Labs  Lab 07/05/23 0908 07/06/23 0610  WBC 8.9 10.0  NEUTROABS 6.9  --   HGB 12.7 11.5*  HCT 41.2 36.3  MCV 86.7 85.4  PLT 360 338   Basic Metabolic Panel:  Recent Labs  Lab 07/05/23 0908 07/06/23 0610  NA 138 138  K 4.1 3.6  CL 105 109  CO2 20* 23  GLUCOSE 168* 91  BUN 26* 21  CREATININE 0.84 0.75  CALCIUM 10.1 9.2   GFR: Estimated Creatinine Clearance: 49.5 mL/min (by C-G formula based on SCr of 0.75 mg/dL). Liver Function Tests:  Recent Labs  Lab 07/05/23 0908 07/06/23 0610  AST 25 20  ALT 18 16  ALKPHOS 58 48  BILITOT 0.5 0.5  PROT 7.4 6.7  ALBUMIN 3.6 3.1*   Recent Results (from the past 240 hours)  Urine Culture     Status: Abnormal   Collection Time: 07/05/23 12:17 PM   Specimen: Urine, Clean Catch  Result Value Ref Range Status   Specimen Description URINE, CLEAN CATCH  Final   Special Requests   Final    NONE Performed at [facility], [address] [place], [place] [address]    Culture >=100,000 COLONIES/mL KLEBSIELLA AEROGENES (A)  Final   Report Status 07/07/2023 FINAL  Final   Organism ID, Bacteria KLEBSIELLA AEROGENES (A)  Final      Susceptibility   Klebsiella aerogenes - MIC*    CEFEPIME <=0.12 SENSITIVE Sensitive     CEFTRIAXONE <=0.25 SENSITIVE Sensitive     CIPROFLOXACIN <=0.25 SENSITIVE Sensitive     GENTAMICIN <=1 SENSITIVE Sensitive     IMIPENEM 2 SENSITIVE Sensitive     NITROFURANTOIN 64 INTERMEDIATE Intermediate     TRIMETH/SULFA <=20 SENSITIVE Sensitive     PIP/TAZO <=4 SENSITIVE Sensitive ug/mL    * >=100,000 COLONIES/mL KLEBSIELLA AEROGENES    Antimicrobials/Microbiology: Anti-infectives (From admission, onward)    Start     Dose/Rate Route Frequency Ordered Stop   07/06/23 1200  cefTRIAXone (ROCEPHIN) 1 g in sodium chloride 0.9 % 100 mL IVPB        1 g 200 mL/hr over 30 Minutes Intravenous Every 24 hours 07/05/23 1427     07/05/23 1230  cefTRIAXone (ROCEPHIN) 1 g in sodium chloride 0.9 % 100 mL IVPB        1 g 200 mL/hr over 30 Minutes Intravenous  Once 07/05/23 1216 07/05/23 1324         Component Value Date/Time   SDES URINE, CLEAN CATCH 07/05/2023 1217   SPECREQUEST  07/05/2023 1217    NONE Performed at [facility], [address] [place], [place] [address]    CULT >=100,000 COLONIES/mL KLEBSIELLA AEROGENES (A) 07/05/2023 1217   REPTSTATUS 07/07/2023 FINAL 07/05/2023 1217     Radiology Studies: No results found.   LOS: 2 days   Total time spent in review of labs and imaging, patient evaluation, formulation of plan, documentation and communication with family:  35  minutes  Lanae Boast, MD [facility]  07/08/2023, 9:57 AM

## 2023-07-09 DIAGNOSIS — I609 Nontraumatic subarachnoid hemorrhage, unspecified: Secondary | ICD-10-CM | POA: Diagnosis not present

## 2023-07-09 LAB — GLUCOSE, CAPILLARY
Glucose-Capillary: 120 mg/dL — ABNORMAL HIGH (ref 70–99)
Glucose-Capillary: 126 mg/dL — ABNORMAL HIGH (ref 70–99)
Glucose-Capillary: 127 mg/dL — ABNORMAL HIGH (ref 70–99)
Glucose-Capillary: 128 mg/dL — ABNORMAL HIGH (ref 70–99)
Glucose-Capillary: 141 mg/dL — ABNORMAL HIGH (ref 70–99)
Glucose-Capillary: 143 mg/dL — ABNORMAL HIGH (ref 70–99)
Glucose-Capillary: 167 mg/dL — ABNORMAL HIGH (ref 70–99)

## 2023-07-09 MED ORDER — MELATONIN 3 MG PO TABS
3.0000 mg | ORAL_TABLET | Freq: Every evening | ORAL | Status: DC | PRN
  Administered 2023-07-17 – 2023-07-23 (×6): 3 mg via ORAL
  Filled 2023-07-09 (×6): qty 1

## 2023-07-09 MED ORDER — HYDRALAZINE HCL 25 MG PO TABS
25.0000 mg | ORAL_TABLET | Freq: Three times a day (TID) | ORAL | Status: DC
  Administered 2023-07-09: 25 mg via ORAL
  Filled 2023-07-09: qty 1

## 2023-07-09 MED ORDER — HYDRALAZINE HCL 10 MG PO TABS
10.0000 mg | ORAL_TABLET | Freq: Three times a day (TID) | ORAL | Status: DC
  Administered 2023-07-09 (×2): 10 mg via ORAL
  Filled 2023-07-09 (×2): qty 1

## 2023-07-09 NOTE — Plan of Care (Signed)
  Problem: Skin Integrity: Goal: Risk for impaired skin integrity will decrease Outcome: Progressing   Problem: Clinical Measurements: Goal: Ability to maintain clinical measurements within normal limits will improve Outcome: Progressing Goal: Will remain free from infection Outcome: Progressing Goal: Diagnostic test results will improve Outcome: Progressing Goal: Respiratory complications will improve Outcome: Progressing Goal: Cardiovascular complication will be avoided Outcome: Progressing   Problem: Safety: Goal: Ability to remain free from injury will improve Outcome: Progressing   Problem: Skin Integrity: Goal: Risk for impaired skin integrity will decrease Outcome: Progressing   Problem: Elimination: Goal: Will not experience complications related to bowel motility Outcome: Progressing Goal: Will not experience complications related to urinary retention Outcome: Progressing

## 2023-07-09 NOTE — Progress Notes (Signed)
PROGRESS NOTE Michelle [name]  QMV:[id number] DOB: 01/08/1933 DOA: 07/05/2023 PCP: Shelva Majestic, MD  Brief Narrative/Hospital Course: 89 y.o. female with medical history significant of dementia largely wheelchair-bound, hypertension, hyperlipidemia, diabetes, COPD, CKD 3, CAD status post stent, epilepsy, diastolic CHF, anxiety, anemia, wheelchair-bound presenting with altered mental status and seizure-family found her.  With active seizure lasting couple of minutes resolving spontaneously. Patient has known history of dementia and seizure disorder with recent suspected breakthrough seizures that lead to admissions in October and November of this year. ED Course: Vitals stable with mild tachycardia tachypnea, labs CMP with bicarb 20, BUN 26, glucose 168.  CBC within normal limits.  Urinalysis with protein, leukocytes, bacteria.  Urine culture SENT CXR>mild vascular congestion CT head>small volume subarachnoid hemorrhage at the right frontal lobe CT C-spine> no acute normality CTA of the head and neck> motion degraded for both but showed no large vessel occlusion or significant stenosis of the head nor no hemodynamically significant stenosis of the neck within the motion degraded limitation. Neurology was consulted , given ceftriaxone,Keppra Ativan and admitted for further management of altered mental status with breakthrough seizure subarachnoid bleed. LTM EEG no seizure, slow waves. Likely she had breakthrough seizure with known seizure disorder secondary to acute toxic metabolic encephalopathy compounded by history of dementia, seen by neurology-placed on Keppra-on follow-up outpatient provider could consider increasing her Lamictal and wean off Keppra as outpatient. BP medication adjusted to optimize blood pressure. PT OT recommended home health.  Subjective: Seen and examined Sleepy this am Did not sleep well last night Wakes and and speaks some Son at bedside Moving her  extremities  Assessment and Plan: Active Problems:   Type 2 diabetes mellitus without complication, with long-term current use of insulin (HCC)   Hyperlipidemia associated with type 2 diabetes mellitus (HCC)   Essential hypertension   COPD (chronic obstructive pulmonary disease) (HCC)   Chronic diastolic CHF (congestive heart failure) (HCC)   CAD- RCA PCI '90s, STEMI-RCA DES 02/18/14   Anxiety state   CKD (chronic kidney disease), stage III (HCC)   Anemia, iron deficiency   Dementia (HCC)   Epilepsy (HCC)   DNR (do not resuscitate)/DNI(Do Not Intubate)   Subarachnoid hemorrhage (HCC)   Seizure (HCC)  Breakthrough seizure with known seizure disorder: Acute toxic metabolic encephalopathy in the background of dementia/anxiety disorder: Mental seems overall improving. On LTM EEG -plan is to discontinue 12/20 if no epileptiform activity.-so far showing intermittent slow, generalized -mild diffuse encephalopathy.   Continue Keppra 500 mg bid,home Lamictal 50 mg bid>on follow up consider increasing Lamictal and taper off Keppra as outpatient. Continue PT OT> recommending home health Continue seizure precaution fall precaution and continue bedtime Seroquel triptan, or SSRI, add melatonin bedtime to regulate sleep.   SAH-small-volume Hypertension: In the setting of seizure w/ SAH.seen by neurology and s/o-monitor to maintain SBP 130s-150s avoid anticoagulants- increased Amlodipine to 5 mg 12/21, continue Coreg and ACE inhibitor, add hydralazine 10 mg tid.She take prn lasix and hydralazine at home  Klebsiella UTI POA: Continue ceftriaxone. C/S reviewed on dc can do keflex.  CAD w./ hx of stents  per son abt 5 yrs ago, and in past 20 yr HLD: No chest pain.  Continue home statin. Holding anticoagulants/antiplatelets 2/2 SAH until cleared by neurology-PTA was on aspirin Plavix.  Chronic diastolic CHF: Last EF normal 60 to 60%, on Lasix prn PTA.Monitor fluid status.  Diabetes  mellitus: Continue SSI, cbg stable as below. Recent Labs  Lab 07/08/23 1611 07/08/23 2103  07/09/23 0013 07/09/23 0413 07/09/23 0735  GLUCAP 151* 168* 141* 127* 128*    COPD: Not in exacerbation.  Continue home inhaler  Ambulatory dysfunction with wheelchair-bound, lives with her younger son.  Goals of care: DNR but family okay with intubation if required for seizures.  Will consult palliative care at some point  DVT prophylaxis: SCDs Start: 07/05/23 1423 Code Status:   Code Status: Do not attempt resuscitation (DNR) PRE-ARREST INTERVENTIONS DESIRED Family Communication: plan of care discussed with patient/son at bedside. Patient status is: Remains hospitalized because of severity of illness Level of care: Progressive   Dispo: The patient is from: Home w/ son            Anticipated disposition: Anticipate discharge home in next 24 hours if mentation is stable and BP stabilizes.   Objective: Vitals last 24 hrs: Vitals:   07/09/23 0214 07/09/23 0410 07/09/23 0556 07/09/23 0730  BP: (!) 170/76 (!) 192/76 (!) 153/77 (!) 151/59  Pulse: 68 71 72 74  Resp: 18 18 17 18   Temp:  98.3 F (36.8 C)  98.4 F (36.9 C)  TempSrc:  Oral  Oral  SpO2: 98% 99% 98% 98%  Weight:      Height:       Weight change:   Physical Examination: General exam: Sleepy able to wake up open eyes but goes back to sleep  HEENT:Oral mucosa moist, Ear/Nose WNL grossly Respiratory system: Bilaterally clear BS,no use of accessory muscle Cardiovascular system: S1 & S2 +, No JVD. Gastrointestinal system: Abdomen soft,NT,ND, BS+ Nervous System: sleepy this am, moving her arms well Extremities: LE edema neg,distal peripheral pulses palpable and warm.  Skin: No rashes,no icterus. MSK: Normal muscle bulk,tone, power   Medications reviewed:  Scheduled Meds:  amLODipine  5 mg Oral Daily   carvedilol  12.5 mg Oral BID   enalapril  20 mg Oral QHS   fenofibrate  54 mg Oral Daily   hydrALAZINE  10 mg Oral TID    icosapent Ethyl  1 g Oral BID WC   And   icosapent Ethyl  2 g Oral QHS   insulin aspart  0-15 Units Subcutaneous Q4H   lamoTRIgine  50 mg Oral BID   levETIRAcetam  500 mg Oral Q12H   medroxyPROGESTERone  10 mg Oral Daily   mometasone-formoterol  2 puff Inhalation BID   nortriptyline  10 mg Oral QHS   pantoprazole  40 mg Oral Daily   QUEtiapine  25 mg Oral QPM   rosuvastatin  40 mg Oral Daily   sertraline  100 mg Oral QHS   sodium chloride flush  3 mL Intravenous Q12H   umeclidinium bromide  1 puff Inhalation Daily  Continuous Infusions:  cefTRIAXone (ROCEPHIN)  IV 1 g (07/08/23 1252)   Diet Order             DIET DYS 3 Room service appropriate? Yes with Assist; Fluid consistency: Thin  Diet effective now                  Intake/Output Summary (Last 24 hours) at 07/09/2023 1000 Last data filed at 07/09/2023 0425 Gross per 24 hour  Intake 483 ml  Output 1025 ml  Net -542 ml   Net IO Since Admission: -1,966.5 mL [07/09/23 1000]  Wt Readings from Last 3 Encounters:  07/05/23 75.8 kg  06/26/23 75.8 kg  06/14/23 76 kg     Unresulted Labs (From admission, onward)    None     Data  Reviewed: I have personally reviewed following labs and imaging studies CBC: Recent Labs  Lab 07/05/23 0908 07/06/23 0610  WBC 8.9 10.0  NEUTROABS 6.9  --   HGB 12.7 11.5*  HCT 41.2 36.3  MCV 86.7 85.4  PLT 360 338   Basic Metabolic Panel:  Recent Labs  Lab 07/05/23 0908 07/06/23 0610  NA 138 138  K 4.1 3.6  CL 105 109  CO2 20* 23  GLUCOSE 168* 91  BUN 26* 21  CREATININE 0.84 0.75  CALCIUM 10.1 9.2   GFR: Estimated Creatinine Clearance: 49.5 mL/min (by C-G formula based on SCr of 0.75 mg/dL). Liver Function Tests:  Recent Labs  Lab 07/05/23 0908 07/06/23 0610  AST 25 20  ALT 18 16  ALKPHOS 58 48  BILITOT 0.5 0.5  PROT 7.4 6.7  ALBUMIN 3.6 3.1*   Recent Results (from the past 240 hours)  Urine Culture     Status: Abnormal   Collection Time: 07/05/23 12:17 PM    Specimen: Urine, Clean Catch  Result Value Ref Range Status   Specimen Description URINE, CLEAN CATCH  Final   Special Requests   Final    NONE Performed at [facility], [address] [place], [place] [address]    Culture >=100,000 COLONIES/mL KLEBSIELLA AEROGENES (A)  Final   Report Status 07/07/2023 FINAL  Final   Organism ID, Bacteria KLEBSIELLA AEROGENES (A)  Final      Susceptibility   Klebsiella aerogenes - MIC*    CEFEPIME <=0.12 SENSITIVE Sensitive     CEFTRIAXONE <=0.25 SENSITIVE Sensitive     CIPROFLOXACIN <=0.25 SENSITIVE Sensitive     GENTAMICIN <=1 SENSITIVE Sensitive     IMIPENEM 2 SENSITIVE Sensitive     NITROFURANTOIN 64 INTERMEDIATE Intermediate     TRIMETH/SULFA <=20 SENSITIVE Sensitive     PIP/TAZO <=4 SENSITIVE Sensitive ug/mL    * >=100,000 COLONIES/mL KLEBSIELLA AEROGENES    Antimicrobials/Microbiology: Anti-infectives (From admission, onward)    Start     Dose/Rate Route Frequency Ordered Stop   07/06/23 1200  cefTRIAXone (ROCEPHIN) 1 g in sodium chloride 0.9 % 100 mL IVPB        1 g 200 mL/hr over 30 Minutes Intravenous Every 24 hours 07/05/23 1427     07/05/23 1230  cefTRIAXone (ROCEPHIN) 1 g in sodium chloride 0.9 % 100 mL IVPB        1 g 200 mL/hr over 30 Minutes Intravenous  Once 07/05/23 1216 07/05/23 1324         Component Value Date/Time   SDES URINE, CLEAN CATCH 07/05/2023 1217   SPECREQUEST  07/05/2023 1217    NONE Performed at [facility], [address], [place], [place] [address]    CULT >=100,000 COLONIES/mL KLEBSIELLA AEROGENES (A) 07/05/2023 1217   REPTSTATUS 07/07/2023 FINAL 07/05/2023 1217     Radiology Studies: No results found.   LOS: 3 days   Total time spent in review of labs and imaging, patient evaluation, formulation of plan, documentation and communication with family:  35  minutes  Lanae Boast, MD [facility]  07/09/2023, 10:00 AM

## 2023-07-09 NOTE — Plan of Care (Signed)
  Problem: Nutritional: Goal: Maintenance of adequate nutrition will improve Outcome: Progressing   Problem: Skin Integrity: Goal: Risk for impaired skin integrity will decrease Outcome: Progressing   Problem: Education: Goal: Knowledge of General Education information will improve Description: Including pain rating scale, medication(s)/side effects and non-pharmacologic comfort measures Outcome: Progressing   Problem: Clinical Measurements: Goal: Respiratory complications will improve Outcome: Progressing Goal: Cardiovascular complication will be avoided Outcome: Progressing   Problem: Activity: Goal: Risk for activity intolerance will decrease Outcome: Progressing   Problem: Coping: Goal: Level of anxiety will decrease Outcome: Progressing   Problem: Elimination: Goal: Will not experience complications related to urinary retention Outcome: Progressing

## 2023-07-10 DIAGNOSIS — I5032 Chronic diastolic (congestive) heart failure: Secondary | ICD-10-CM | POA: Diagnosis not present

## 2023-07-10 LAB — GLUCOSE, CAPILLARY
Glucose-Capillary: 130 mg/dL — ABNORMAL HIGH (ref 70–99)
Glucose-Capillary: 108 mg/dL — ABNORMAL HIGH (ref 70–99)
Glucose-Capillary: 199 mg/dL — ABNORMAL HIGH (ref 70–99)
Glucose-Capillary: 155 mg/dL — ABNORMAL HIGH (ref 70–99)
Glucose-Capillary: 136 mg/dL — ABNORMAL HIGH (ref 70–99)

## 2023-07-10 MED ORDER — HYDRALAZINE HCL 10 MG PO TABS: 10.0000 mg | ORAL_TABLET | Freq: Three times a day (TID) | ORAL | Status: DC | PRN

## 2023-07-10 MED ORDER — AMLODIPINE BESYLATE 5 MG PO TABS
10.0000 mg | ORAL_TABLET | Freq: Every day | ORAL | Status: DC
  Filled 2023-07-10: qty 2

## 2023-07-10 MED ORDER — HYDRALAZINE HCL 10 MG PO TABS
10.0000 mg | ORAL_TABLET | Freq: Three times a day (TID) | ORAL | Status: DC
  Administered 2023-07-10 – 2023-07-21 (×25): 10 mg via ORAL
  Filled 2023-07-10 (×32): qty 1

## 2023-07-10 MED ORDER — AMLODIPINE BESYLATE 5 MG PO TABS
5.0000 mg | ORAL_TABLET | Freq: Every day | ORAL | Status: DC
Start: 2023-07-10 — End: 2023-07-11

## 2023-07-10 NOTE — Care Management Important Message (Signed)
Important Message  Patient Details  Name: Michelle [name] MRN: [id number] Date of Birth: 07-09-1933   Important Message Given:  Yes - Medicare IM     Dorena Bodo 07/10/2023, 2:32 PM

## 2023-07-10 NOTE — TOC Progression Note (Signed)
Transition of Care [facility]) - Progression Note    Patient Details  Name: Michelle [name] MRN: [id number] Date of Birth: 1933/03/16  [facility] CM/SW Contact  Kermit Balo, RN Phone Number: 07/10/2023, 1:27 PM  Clinical Narrative:     Hope for d/c home in next 1-2 days. [facility] arranged for [facility] services.  TOC following.  Expected Discharge Plan: Home w Home Health Services Barriers to Discharge: Continued Medical Work up  Expected Discharge Plan and Services   Discharge Planning Services: CM Consult Post Acute Care Choice: Home Health Living arrangements for the past 2 months: Single Family Home                                       Social Determinants of Health (SDOH) Interventions SDOH Screenings   Food Insecurity: Patient Unable To Answer (07/05/2023)  Housing: Patient Unable To Answer (07/05/2023)  Transportation Needs: Patient Unable To Answer (07/05/2023)  Utilities: Patient Unable To Answer (07/05/2023)  Depression (PHQ2-9): Medium Risk (06/26/2023)  Financial Resource Strain: Low Risk  (06/26/2023)  Physical Activity: Insufficiently Active (06/26/2023)  Social Connections: Socially Isolated (06/26/2023)  Stress: No Stress Concern Present (06/26/2023)  Tobacco Use: Medium Risk (07/05/2023)  Health Literacy: Adequate Health Literacy (06/26/2023)    Readmission Risk Interventions     No data to display

## 2023-07-10 NOTE — Progress Notes (Signed)
PROGRESS NOTE Michelle [name]  NGE:[id number] DOB: 11/07/1932 DOA: 07/05/2023 PCP: Shelva Majestic, MD  Brief Narrative/Hospital Course: 89 y.o. female with medical history significant of dementia largely wheelchair-bound, hypertension, hyperlipidemia, diabetes, COPD, CKD 3, CAD status post stent, epilepsy, diastolic CHF, anxiety, anemia, wheelchair-bound presenting with altered mental status and seizure-family found her.  With active seizure lasting couple of minutes resolving spontaneously. Patient has known history of dementia and seizure disorder with recent suspected breakthrough seizures that lead to admissions in October and November of this year. ED Course: Vitals stable with mild tachycardia tachypnea, labs CMP with bicarb 20, BUN 26, glucose 168.  CBC within normal limits.  Urinalysis with protein, leukocytes, bacteria.  Urine culture SENT CXR>mild vascular congestion CT head>small volume subarachnoid hemorrhage at the right frontal lobe CT C-spine> no acute normality CTA of the head and neck> motion degraded for both but showed no large vessel occlusion or significant stenosis of the head nor no hemodynamically significant stenosis of the neck within the motion degraded limitation. Neurology was consulted , given ceftriaxone,Keppra Ativan and admitted for further management of altered mental status with breakthrough seizure and small right frontal SAH concerning for possible amyloid etiology  LTM EEG no seizure, slow waves. Suspect breakthrough seizure secondary to acute toxic metabolic encephalopathy compounded by history of dementia, seen by neurology-placed on Keppra-on follow-up outpatient provider could consider increasing her Lamictal and wean off Keppra as outpatient.BP medication adjusted to optimize blood pressure for goal of 130-150.  Patient completed 5 days of antibiotics. PT OT recommended home health.  Subjective: Patient seen and examined  This morning she was alert awake  taking her meds  Revisited later, son at the bedside-reports patient needed 2 person to get her to the bedside chair is concerned about her going home Also concerned that she may not get SNF as she was recently at a SNF-wants to discuss with social worker Blood pressure overall stable this morning did not go up during night/.early am  Assessment and Plan: Active Problems:   Type 2 diabetes mellitus without complication, with long-term current use of insulin (HCC)   Hyperlipidemia associated with type 2 diabetes mellitus (HCC)   Essential hypertension   COPD (chronic obstructive pulmonary disease) (HCC)   Chronic diastolic CHF (congestive heart failure) (HCC)   CAD- RCA PCI '90s, STEMI-RCA DES 02/18/14   Anxiety state   CKD (chronic kidney disease), stage III (HCC)   Anemia, iron deficiency   Dementia (HCC)   Epilepsy (HCC)   DNR (do not resuscitate)/DNI(Do Not Intubate)   Subarachnoid hemorrhage (HCC)   Seizure (HCC)  Breakthrough seizure with known seizure disorder: Acute toxic metabolic encephalopathy in the background of dementia/anxiety disorder: Mental seems overall improving. On LTM EEG -plan is to discontinue 12/20 if no epileptiform activity.-so far showing intermittent slow, generalized -mild diffuse encephalopathy.   Continue Keppra 500 mg bid,home Lamictal 50 mg bid>on follow up can increase Lamictal and taper off Keppra Continue PT OT> recommending home health and bed to go home-with monitoring her blood pressure at this time stable. Continue her home bedtime Seroquel triptan, or SSRI, cont here melatonin  prn regulate sleep.    Small right frontal SAH  Hypertension: Small frontal SAH concerning for possible amyloid etiology-complete workup with CTA of the head and neck, seen by neurology.Maintain SBP 130s-150s avoid anticoagulants. Having issues with soft low blood pressure needing to hold multiple antihypertensives 12/23-> changed her back on home regimen 2.5 amlodipine and  can take extar 2.5 mg  prn at home  Cont current Coreg, ACE inhibitor, now on hydralazine 10 mg tid along> monitor next 24 hours Advised for close blood pressure monitoring at home and outpatient follow-up w/ PCP-discussed with patient son at the bedside extensively.  He understands that due to multiple issues with blood pressure can be high in the hospital setting.   Klebsiella UTI POA: Patient completing rocephin   CAD  HLD: Cath 1990 with Dr. Riley Kill and pt reports blockage in artery with angioplasty. She has pictures that show severe stenosis mid RCA and a post PTCA picture with 30% residual stenosis post PTCA. Residual CAD, non obstructive per 2015 cath. STEMI status post stent in August 2015.  No chest pain.  Continue home statin. Held antiplatelets 2/2 SAH on admission-discussed  07/10/23 w/ Dr. Pearlean Brownie from neurology AND given her cardiac history  He advised to resume aspirin 81 alone upon discharge and discontinue Plavix- this was communicated with patient's son at the bedside   Chronic diastolic CHF: Last EF normal 60 to 60%, on Lasix prn PTA.Monitor fluid status.  COPD: Not in exacerbation.  Continue home inhaler   Ambulatory dysfunction with wheelchair-bound, lives with her younger son.   Goals of care: DNR but family okay with intubation if required for seizures.  Will consult palliative care at some point  Diabetes mellitus: Continue SSI, cbg stable as below. Recent Labs  Lab 07/10/23 1611 07/10/23 1955 07/11/23 0016 07/11/23 0409 07/11/23 0746  GLUCAP 155* 136* 133* 128* 111*    DVT prophylaxis: SCDs Start: 07/05/23 1423 Code Status:   Code Status: Do not attempt resuscitation (DNR) PRE-ARREST INTERVENTIONS DESIRED Family Communication: plan of care discussed with patient/son at bedside. Patient status is: Remains hospitalized because of severity of illness Level of care: Progressive   Dispo: The patient is from: Home w/ son            Anticipated disposition: Son  is concerned about her safety going home-reports patient lives with his brother who uses cane-and will not be able to manage her. I messaged PT and TOC team about possibility of sending her to skilled nursing facility.    Objective: Vitals last 24 hrs: Vitals:   07/11/23 0745 07/11/23 0747 07/11/23 0842 07/11/23 0843  BP: (!) 145/58  (!) 145/58 (!) 145/58  Pulse: 69  (!) 36 69  Resp:    18  Temp: 98.2 F (36.8 C)   98.2 F (36.8 C)  TempSrc: Oral     SpO2: 99% 99%  99%  Weight:      Height:       Weight change:   Physical Examination: General exam: alert awake, oriented to self place people HEENT:Oral mucosa moist, Ear/Nose WNL grossly Respiratory system: Bilaterally clear BS,no use of accessory muscle Cardiovascular system: S1 & S2 +, No JVD. Gastrointestinal system: Abdomen soft,NT,ND, BS+ Nervous System: Alert, awake, moving all extremities,and following commands. Extremities: LE edema neg,distal peripheral pulses palpable and warm.  Skin: No rashes,no icterus. MSK: Normal muscle bulk,tone, power   Medications reviewed:  Scheduled Meds:  amLODipine  2.5 mg Oral Daily   carvedilol  12.5 mg Oral BID   enalapril  20 mg Oral QHS   fenofibrate  54 mg Oral Daily   hydrALAZINE  10 mg Oral TID   icosapent Ethyl  1 g Oral BID WC   And   icosapent Ethyl  2 g Oral QHS   insulin aspart  0-15 Units Subcutaneous Q4H   lamoTRIgine  50 mg Oral BID  levETIRAcetam  500 mg Oral Q12H   medroxyPROGESTERone  10 mg Oral Daily   mometasone-formoterol  2 puff Inhalation BID   nortriptyline  10 mg Oral QHS   pantoprazole  40 mg Oral Daily   QUEtiapine  25 mg Oral QPM   rosuvastatin  40 mg Oral Daily   sertraline  100 mg Oral QHS   sodium chloride flush  3 mL Intravenous Q12H   umeclidinium bromide  1 puff Inhalation Daily  Continuous Infusions:  cefTRIAXone (ROCEPHIN)  IV Stopped (07/09/23 1246)   Diet Order             DIET DYS 3 Room service appropriate? Yes with Assist; Fluid  consistency: Thin  Diet effective now                  Intake/Output Summary (Last 24 hours) at 07/11/2023 1142 Last data filed at 07/11/2023 0410 Gross per 24 hour  Intake 303 ml  Output 575 ml  Net -272 ml   Net IO Since Admission: -1,685.5 mL [07/11/23 1142]  Wt Readings from Last 3 Encounters:  07/05/23 75.8 kg  06/26/23 75.8 kg  06/14/23 76 kg     Unresulted Labs (From admission, onward)    None     Data Reviewed: I have personally reviewed following labs and imaging studies CBC: Recent Labs  Lab 07/05/23 0908 07/06/23 0610  WBC 8.9 10.0  NEUTROABS 6.9  --   HGB 12.7 11.5*  HCT 41.2 36.3  MCV 86.7 85.4  PLT 360 338   Basic Metabolic Panel:  Recent Labs  Lab 07/05/23 0908 07/06/23 0610  NA 138 138  K 4.1 3.6  CL 105 109  CO2 20* 23  GLUCOSE 168* 91  BUN 26* 21  CREATININE 0.84 0.75  CALCIUM 10.1 9.2   GFR: Estimated Creatinine Clearance: 49.5 mL/min (by C-G formula based on SCr of 0.75 mg/dL). Liver Function Tests:  Recent Labs  Lab 07/05/23 0908 07/06/23 0610  AST 25 20  ALT 18 16  ALKPHOS 58 48  BILITOT 0.5 0.5  PROT 7.4 6.7  ALBUMIN 3.6 3.1*   Recent Results (from the past 240 hours)  Urine Culture     Status: Abnormal   Collection Time: 07/05/23 12:17 PM   Specimen: Urine, Clean Catch  Result Value Ref Range Status   Specimen Description URINE, CLEAN CATCH  Final   Special Requests   Final    NONE Performed at [facility], [address] [place], [place] [address]    Culture >=100,000 COLONIES/mL KLEBSIELLA AEROGENES (A)  Final   Report Status 07/07/2023 FINAL  Final   Organism ID, Bacteria KLEBSIELLA AEROGENES (A)  Final      Susceptibility   Klebsiella aerogenes - MIC*    CEFEPIME <=0.12 SENSITIVE Sensitive     CEFTRIAXONE <=0.25 SENSITIVE Sensitive     CIPROFLOXACIN <=0.25 SENSITIVE Sensitive     GENTAMICIN <=1 SENSITIVE Sensitive     IMIPENEM 2 SENSITIVE Sensitive     NITROFURANTOIN 64 INTERMEDIATE Intermediate      TRIMETH/SULFA <=20 SENSITIVE Sensitive     PIP/TAZO <=4 SENSITIVE Sensitive ug/mL    * >=100,000 COLONIES/mL KLEBSIELLA AEROGENES    Antimicrobials/Microbiology: Anti-infectives (From admission, onward)    Start     Dose/Rate Route Frequency Ordered Stop   07/06/23 1200  cefTRIAXone (ROCEPHIN) 1 g in sodium chloride 0.9 % 100 mL IVPB        1 g 200 mL/hr over 30 Minutes Intravenous  Every 24 hours 07/05/23 1427     07/05/23 1230  cefTRIAXone (ROCEPHIN) 1 g in sodium chloride 0.9 % 100 mL IVPB        1 g 200 mL/hr over 30 Minutes Intravenous  Once 07/05/23 1216 07/05/23 1324         Component Value Date/Time   SDES URINE, CLEAN CATCH 07/05/2023 1217   SPECREQUEST  07/05/2023 1217    NONE Performed at [facility], [address], [place], [place] [address]    CULT >=100,000 COLONIES/mL KLEBSIELLA AEROGENES (A) 07/05/2023 1217   REPTSTATUS 07/07/2023 FINAL 07/05/2023 1217     Radiology Studies: No results found.   LOS: 5 days  Total time spent in review of labs and imaging, patient evaluation, formulation of plan, documentation and communication with family:  35  minutes  Lanae Boast, MD [facility]  07/11/2023, 11:42 AM

## 2023-07-10 NOTE — Plan of Care (Signed)
  Problem: Health Behavior/Discharge Planning: Goal: Ability to identify and utilize available resources and services will improve Outcome: Progressing Goal: Ability to manage health-related needs will improve Outcome: Progressing   Problem: Skin Integrity: Goal: Risk for impaired skin integrity will decrease Outcome: Progressing   Problem: Nutritional: Goal: Maintenance of adequate nutrition will improve Outcome: Progressing Goal: Progress toward achieving an optimal weight will improve Outcome: Progressing   Problem: Clinical Measurements: Goal: Ability to maintain clinical measurements within normal limits will improve Outcome: Progressing Goal: Will remain free from infection Outcome: Progressing Goal: Diagnostic test results will improve Outcome: Progressing Goal: Respiratory complications will improve Outcome: Progressing Goal: Cardiovascular complication will be avoided Outcome: Progressing   Problem: Elimination: Goal: Will not experience complications related to bowel motility Outcome: Progressing Goal: Will not experience complications related to urinary retention Outcome: Progressing   Problem: Safety: Goal: Ability to remain free from injury will improve Outcome: Progressing   Problem: Skin Integrity: Goal: Risk for impaired skin integrity will decrease Outcome: Progressing

## 2023-07-10 NOTE — Progress Notes (Signed)
Physical Therapy Treatment Patient Details Name: Michelle [name] MRN: [id number] DOB: 1932/11/11 Today's Date: 07/10/2023   History of Present Illness 89 yo female presenting with AMS and seizures on 07/05/23. CT head showing Small volume subarachnoid hemorrhage in the right frontal lobe. PMH including dementia, hypertension, hyperlipidemia, diabetes, COPD, CKD 3, CAD status post stent, epilepsy, diastolic CHF, anxiety, anemia.    PT Comments  Patient easily falling asleep between commands/activities. Most difficulty with scooting out to sit on EOB (once seated upright) and required mod assist. Sit to stand and pivoting to chair with RW improved with min assist. Repeated standing from chair with min assist with cues. Son present and reports she is less confused today.     If plan is discharge home, recommend the following: Supervision due to cognitive status;Assist for transportation;Assistance with cooking/housework;Help with stairs or ramp for entrance;A little help with walking and/or transfers;Direct supervision/assist for medications management;Direct supervision/assist for financial management   Can travel by private vehicle     No  Equipment Recommendations  None recommended by PT    Recommendations for Other Services       Precautions / Restrictions Precautions Precautions: Fall Restrictions Weight Bearing Restrictions Per Provider Order: No     Mobility  Bed Mobility Overal bed mobility: Needs Assistance Bed Mobility: Supine to Sit     Supine to sit: Mod assist, HOB elevated, Used rails     General bed mobility comments: Increased time with cues; assist with bed pad under pelvis to scoot out toward EOB    Transfers Overall transfer level: Needs assistance Equipment used: Rolling walker (2 wheels) Transfers: Sit to/from Stand Sit to Stand: Min assist   Step pivot transfers: Min assist       General transfer comment: STS x 2 with increased time to rise and  min A.  Stand pivot to chair toward R side - fatigued easily and wanting to lean her forearms on hnadles of RW; cues for upright posture to incr ease of stepping    Ambulation/Gait                   Stairs             Wheelchair Mobility     Tilt Bed    Modified Rankin (Stroke Patients Only)       Balance Overall balance assessment: Needs assistance Sitting-balance support: No upper extremity supported, Feet supported Sitting balance-Leahy Scale: Fair     Standing balance support: Bilateral upper extremity supported Standing balance-Leahy Scale: Poor Standing balance comment: Needs RW and min A static;                            Cognition Arousal: Lethargic Behavior During Therapy: WFL for tasks assessed/performed Overall Cognitive Status: Impaired/Different from baseline Area of Impairment: Memory, Following commands, Safety/judgement, Awareness, Problem solving, Orientation, Attention                 Orientation Level: Disoriented to, Situation (knows hospital and month) Current Attention Level: Selective Memory: Decreased short-term memory Following Commands: Follows one step commands consistently, Follows multi-step commands inconsistently, Follows one step commands with increased time Safety/Judgement: Decreased awareness of safety, Decreased awareness of deficits Awareness: Emergent Problem Solving: Slow processing, Requires tactile cues, Requires verbal cues, Decreased initiation General Comments: Son reports she is slower to respond than usual. Requires repetition of cues as she internally distracts/loses track of request  Exercises General Exercises - Lower Extremity Ankle Circles/Pumps: AROM, Both, 10 reps, Supine Quad Sets: AROM, Both, Supine, 10 reps Long Arc Quad: AROM, Both, Seated, 10 reps    General Comments General comments (skin integrity, edema, etc.): Son, [name], present (she lives with [name]).       Pertinent Vitals/Pain Pain Assessment Pain Assessment: No/denies pain    Home Living                          Prior Function            PT Goals (current goals can now be found in the care plan section) Acute Rehab PT Goals Patient Stated Goal: return home Time For Goal Achievement: 07/21/23 Potential to Achieve Goals: Fair Progress towards PT goals: Progressing toward goals    Frequency    Min 1X/week      PT Plan      Co-evaluation              AM-PAC PT "6 Clicks" Mobility   Outcome Measure  Help needed turning from your back to your side while in a flat bed without using bedrails?: A Little Help needed moving from lying on your back to sitting on the side of a flat bed without using bedrails?: A Lot Help needed moving to and from a bed to a chair (including a wheelchair)?: A Little Help needed standing up from a chair using your arms (e.g., wheelchair or bedside chair)?: A Little Help needed to walk in hospital room?: Total Help needed climbing 3-5 steps with a railing? : Total 6 Click Score: 13    End of Session Equipment Utilized During Treatment: Gait belt Activity Tolerance: Patient tolerated treatment well Patient left: with chair alarm set;in chair;with call bell/phone within reach;with family/visitor present;with nursing/sitter in room Nurse Communication: Mobility status PT Visit Diagnosis: Other abnormalities of gait and mobility (R26.89);Muscle weakness (generalized) (M62.81);Other symptoms and signs involving the nervous system (R29.898)     Time: 2536-6440 PT Time Calculation (min) (ACUTE ONLY): 32 min  Charges:    $Therapeutic Activity: 23-37 mins PT General Charges $$ ACUTE PT VISIT: 1 Visit                      Jerolyn Center, PT Acute Rehabilitation Services  Office [phone]    Zena Amos 07/10/2023, 10:30 AM

## 2023-07-10 NOTE — Progress Notes (Signed)
PROGRESS NOTE Michelle [name]  WJX:[id number] DOB: 19-Oct-1932 DOA: 07/05/2023 PCP: Shelva Majestic, MD  Brief Narrative/Hospital Course: 89 y.o. female with medical history significant of dementia largely wheelchair-bound, hypertension, hyperlipidemia, diabetes, COPD, CKD 3, CAD status post stent, epilepsy, diastolic CHF, anxiety, anemia, wheelchair-bound presenting with altered mental status and seizure-family found her.  With active seizure lasting couple of minutes resolving spontaneously. Patient has known history of dementia and seizure disorder with recent suspected breakthrough seizures that lead to admissions in October and November of this year. ED Course: Vitals stable with mild tachycardia tachypnea, labs CMP with bicarb 20, BUN 26, glucose 168.  CBC within normal limits.  Urinalysis with protein, leukocytes, bacteria.  Urine culture SENT CXR>mild vascular congestion CT head>small volume subarachnoid hemorrhage at the right frontal lobe CT C-spine> no acute normality CTA of the head and neck> motion degraded for both but showed no large vessel occlusion or significant stenosis of the head nor no hemodynamically significant stenosis of the neck within the motion degraded limitation. Neurology was consulted , given ceftriaxone,Keppra Ativan and admitted for further management of altered mental status with breakthrough seizure and small right frontal SAH concerning for possible amyloid etiology  LTM EEG no seizure, slow waves. Suspect breakthrough seizure secondary to acute toxic metabolic encephalopathy compounded by history of dementia, seen by neurology-placed on Keppra-on follow-up outpatient provider could consider increasing her Lamictal and wean off Keppra as outpatient.  Subjective: Patient seen and examined seen and examined On the bed this morning alert awake comfortable no complaints, wants to go home Came to recheck on her again in the afternoon, son at the bedside-is  concerned about her recurrent admission does not feel comfortable going home today-patient not very happy about it. She also says been fluctuating  Assessment and Plan: Active Problems:   Type 2 diabetes mellitus without complication, with long-term current use of insulin (HCC)   Hyperlipidemia associated with type 2 diabetes mellitus (HCC)   Essential hypertension   COPD (chronic obstructive pulmonary disease) (HCC)   Chronic diastolic CHF (congestive heart failure) (HCC)   CAD- RCA PCI '90s, STEMI-RCA DES 02/18/14   Anxiety state   CKD (chronic kidney disease), stage III (HCC)   Anemia, iron deficiency   Dementia (HCC)   Epilepsy (HCC)   DNR (do not resuscitate)/DNI(Do Not Intubate)   Subarachnoid hemorrhage (HCC)   Seizure (HCC)  Breakthrough seizure with known seizure disorder: Acute toxic metabolic encephalopathy in the background of dementia/anxiety disorder: Mental seems overall improving. On LTM EEG -plan is to discontinue 12/20 if no epileptiform activity.-so far showing intermittent slow, generalized -mild diffuse encephalopathy.   Continue Keppra 500 mg bid,home Lamictal 50 mg bid>on follow up can increase Lamictal and taper off Keppra Continue PT OT> recommending home health Continue seizure precaution fall precaution Continue her home bedtime Seroquel triptan, or SSRI, cont here melatonin  prn regulate sleep.   Small right frontal SAH  Hypertension: Small frontal SAH concerning for possible amyloid etiology-complete workup with CTA of the head and neck, seen by neurology.Maintain SBP 130s-150s avoid anticoagulants. Increased Amlodipine to 5 mg 12/21 added on hydralazine 10 mg tid, on home Coreg and ACE inhibitor,10 mg tid  PTA on prn lasix and hydralazine.  BP somewhat on lower range this morning and antihypertensives held.  Patient asymptomatic.  Klebsiella UTI POA: Continue ceftriaxone. C/S reviewed on dc can do keflex.  CAD w./ hx of stents  per son abt 5 yrs ago,  and in past 20 yr HLD:  No chest pain.  Continue home statin. Holding anticoagulants/antiplatelets 2/2 SAH-discussed  12/23 w/ Dr. Pearlean Brownie from neurology given her cardiac history advised to resume aspirin alone upon discharge and discontinue Plavix.  Chronic diastolic CHF: Last EF normal 60 to 60%, on Lasix prn PTA.Monitor fluid status.  Diabetes mellitus: Continue SSI, cbg stable as below. Recent Labs  Lab 07/09/23 1934 07/09/23 2319 07/10/23 0313 07/10/23 0820 07/10/23 1147  GLUCAP 143* 120* 130* 108* 199*    COPD: Not in exacerbation.  Continue home inhaler  Ambulatory dysfunction with wheelchair-bound, lives with her younger son.  Goals of care: DNR but family okay with intubation if required for seizures.  Will consult palliative care at some point  DVT prophylaxis: SCDs Start: 07/05/23 1423 Code Status:   Code Status: Do not attempt resuscitation (DNR) PRE-ARREST INTERVENTIONS DESIRED Family Communication: plan of care discussed with patient/son at bedside. Patient status is: Remains hospitalized because of severity of illness Level of care: Progressive   Dispo: The patient is from: Home w/ son            Anticipated disposition: Anticipate discharge home in next 24 hours iif BP remains stable   Objective: Vitals last 24 hrs: Vitals:   07/10/23 1015 07/10/23 1020 07/10/23 1146 07/10/23 1211  BP: (!) 112/43 (!) 101/43 124/63 (!) 124/50  Pulse:   63   Resp:      Temp:   (!) 97.5 F (36.4 C)   TempSrc:   Oral   SpO2:   98%   Weight:      Height:       Weight change:   Physical Examination: General exam: alert awake, pleasant, frail-appearing , with mild baseline dementia HEENT:Oral mucosa moist, Ear/Nose WNL grossly Respiratory system: Bilaterally clear BS,no use of accessory muscle Cardiovascular system: S1 & S2 +, No JVD. Gastrointestinal system: Abdomen soft,NT,ND, BS+ Nervous System: Alert, awake, moving all extremities,and following  commands. Extremities: LE edema neg,distal peripheral pulses palpable and warm.  Skin: No rashes,no icterus. MSK: Normal muscle bulk,tone, power   Medications reviewed:  Scheduled Meds:  amLODipine  5 mg Oral Daily   carvedilol  12.5 mg Oral BID   enalapril  20 mg Oral QHS   fenofibrate  54 mg Oral Daily   hydrALAZINE  10 mg Oral TID   icosapent Ethyl  1 g Oral BID WC   And   icosapent Ethyl  2 g Oral QHS   insulin aspart  0-15 Units Subcutaneous Q4H   lamoTRIgine  50 mg Oral BID   levETIRAcetam  500 mg Oral Q12H   medroxyPROGESTERone  10 mg Oral Daily   mometasone-formoterol  2 puff Inhalation BID   nortriptyline  10 mg Oral QHS   pantoprazole  40 mg Oral Daily   QUEtiapine  25 mg Oral QPM   rosuvastatin  40 mg Oral Daily   sertraline  100 mg Oral QHS   sodium chloride flush  3 mL Intravenous Q12H   umeclidinium bromide  1 puff Inhalation Daily  Continuous Infusions:  cefTRIAXone (ROCEPHIN)  IV Stopped (07/09/23 1246)   Diet Order             DIET DYS 3 Room service appropriate? Yes with Assist; Fluid consistency: Thin  Diet effective now                  Intake/Output Summary (Last 24 hours) at 07/10/2023 1321 Last data filed at 07/10/2023 1156 Gross per 24 hour  Intake 703 ml  Output --  Net 703 ml   Net IO Since Admission: -1,113.5 mL [07/10/23 1321]  Wt Readings from Last 3 Encounters:  07/05/23 75.8 kg  06/26/23 75.8 kg  06/14/23 76 kg     Unresulted Labs (From admission, onward)    None     Data Reviewed: I have personally reviewed following labs and imaging studies CBC: Recent Labs  Lab 07/05/23 0908 07/06/23 0610  WBC 8.9 10.0  NEUTROABS 6.9  --   HGB 12.7 11.5*  HCT 41.2 36.3  MCV 86.7 85.4  PLT 360 338   Basic Metabolic Panel:  Recent Labs  Lab 07/05/23 0908 07/06/23 0610  NA 138 138  K 4.1 3.6  CL 105 109  CO2 20* 23  GLUCOSE 168* 91  BUN 26* 21  CREATININE 0.84 0.75  CALCIUM 10.1 9.2   GFR: Estimated Creatinine  Clearance: 49.5 mL/min (by C-G formula based on SCr of 0.75 mg/dL). Liver Function Tests:  Recent Labs  Lab 07/05/23 0908 07/06/23 0610  AST 25 20  ALT 18 16  ALKPHOS 58 48  BILITOT 0.5 0.5  PROT 7.4 6.7  ALBUMIN 3.6 3.1*   Recent Results (from the past 240 hours)  Urine Culture     Status: Abnormal   Collection Time: 07/05/23 12:17 PM   Specimen: Urine, Clean Catch  Result Value Ref Range Status   Specimen Description URINE, CLEAN CATCH  Final   Special Requests   Final    NONE Performed at [facility], [address], [place], [place] [address]    Culture >=100,000 COLONIES/mL KLEBSIELLA AEROGENES (A)  Final   Report Status 07/07/2023 FINAL  Final   Organism ID, Bacteria KLEBSIELLA AEROGENES (A)  Final      Susceptibility   Klebsiella aerogenes - MIC*    CEFEPIME <=0.12 SENSITIVE Sensitive     CEFTRIAXONE <=0.25 SENSITIVE Sensitive     CIPROFLOXACIN <=0.25 SENSITIVE Sensitive     GENTAMICIN <=1 SENSITIVE Sensitive     IMIPENEM 2 SENSITIVE Sensitive     NITROFURANTOIN 64 INTERMEDIATE Intermediate     TRIMETH/SULFA <=20 SENSITIVE Sensitive     PIP/TAZO <=4 SENSITIVE Sensitive ug/mL    * >=100,000 COLONIES/mL KLEBSIELLA AEROGENES    Antimicrobials/Microbiology: Anti-infectives (From admission, onward)    Start     Dose/Rate Route Frequency Ordered Stop   07/06/23 1200  cefTRIAXone (ROCEPHIN) 1 g in sodium chloride 0.9 % 100 mL IVPB        1 g 200 mL/hr over 30 Minutes Intravenous Every 24 hours 07/05/23 1427     07/05/23 1230  cefTRIAXone (ROCEPHIN) 1 g in sodium chloride 0.9 % 100 mL IVPB        1 g 200 mL/hr over 30 Minutes Intravenous  Once 07/05/23 1216 07/05/23 1324         Component Value Date/Time   SDES URINE, CLEAN CATCH 07/05/2023 1217   SPECREQUEST  07/05/2023 1217    NONE Performed at [facility], [address], [place], [place] [address]    CULT >=100,000 COLONIES/mL KLEBSIELLA AEROGENES (A) 07/05/2023 1217   REPTSTATUS 07/07/2023  FINAL 07/05/2023 1217     Radiology Studies: No results found.   LOS: 4 days   Total time spent in review of labs and imaging, patient evaluation, formulation of plan, documentation and communication with family:  35  minutes  Lanae Boast, MD [facility]  07/10/2023, 1:21 PM

## 2023-07-10 NOTE — Progress Notes (Signed)
Speech Language Pathology Treatment: Dysphagia  Patient Details Name: Michelle [name] MRN: [id number] DOB: 08/28/1932 Today's Date: 07/10/2023 Time: 6295-2841 SLP Time Calculation (min) (ACUTE ONLY): 12 min  Assessment / Plan / Recommendation Clinical Impression  Pt observed with trials of Dys 3 solids and thin liquids. She presents with mildly prolonged mastication in the setting of edentulism, but achieved eventual oral clearance. Pt had one instance of a delayed cough following sips of water via straw that did not recur throughout entirety of session, even when challenged with sequential sips. Her son endorses some difficulty taking pills with liquids, although during discussions with pt's other son during previous visit, he states pt has been taking pills with purees. Recommend continuing current diet. Meds should be given crushed or whole with purees. Education provided to pt and her son. No further acute SLP f/u is necessary at this time, will s/o.    HPI HPI: Michelle [name] is an 89 yo female presenting to ED 12/18 with AMS and seizure. Previously admitted in October and November 2024 for similar suspected breakthrough seizures. Known to SLP during previous admission with recommendations for a Dys 3 diet with thin liquids. PMH includes dementia, HTN, HLD, DM, COPD, CKD 3, CAD s/p stent, epilepsy, diastolic CHF, anxiety, anemia, wheelchair bound      SLP Plan  All goals met      Recommendations for follow up therapy are one component of a multi-disciplinary discharge planning process, led by the attending physician.  Recommendations may be updated based on patient status, additional functional criteria and insurance authorization.    Recommendations  Diet recommendations: Dysphagia 3 (mechanical soft);Thin liquid Liquids provided via: Cup;Straw Medication Administration: Whole meds with puree Supervision: Patient able to self feed;Intermittent supervision to cue for compensatory  strategies Compensations: Minimize environmental distractions;Slow rate;Small sips/bites Postural Changes and/or Swallow Maneuvers: Seated upright 90 degrees                  Oral care BID;Staff/trained caregiver to provide oral care   Frequent or constant Supervision/Assistance Dysphagia, unspecified (R13.10)     All goals met     Gwynneth Aliment, M.A., CF-SLP Speech Language Pathology, Acute Rehabilitation Services  Secure Chat preferred [phone]   07/10/2023, 12:17 PM

## 2023-07-11 DIAGNOSIS — I609 Nontraumatic subarachnoid hemorrhage, unspecified: Secondary | ICD-10-CM | POA: Diagnosis not present

## 2023-07-11 DIAGNOSIS — R569 Unspecified convulsions: Secondary | ICD-10-CM | POA: Diagnosis not present

## 2023-07-11 LAB — GLUCOSE, CAPILLARY
Glucose-Capillary: 111 mg/dL — ABNORMAL HIGH (ref 70–99)
Glucose-Capillary: 113 mg/dL — ABNORMAL HIGH (ref 70–99)
Glucose-Capillary: 128 mg/dL — ABNORMAL HIGH (ref 70–99)
Glucose-Capillary: 133 mg/dL — ABNORMAL HIGH (ref 70–99)
Glucose-Capillary: 143 mg/dL — ABNORMAL HIGH (ref 70–99)
Glucose-Capillary: 146 mg/dL — ABNORMAL HIGH (ref 70–99)
Glucose-Capillary: 186 mg/dL — ABNORMAL HIGH (ref 70–99)

## 2023-07-11 MED ORDER — AMLODIPINE BESYLATE 2.5 MG PO TABS
2.5000 mg | ORAL_TABLET | Freq: Every day | ORAL | Status: DC
  Administered 2023-07-11 – 2023-07-22 (×12): 2.5 mg via ORAL
  Filled 2023-07-11 (×12): qty 1

## 2023-07-11 NOTE — Progress Notes (Signed)
Physical Therapy Treatment Patient Details Name: Michelle [name] MRN: [id number] DOB: Aug 04, 1932 Today's Date: 07/11/2023   History of Present Illness 89 yo female presenting with AMS and seizures on 07/05/23. CT head showing Small volume subarachnoid hemorrhage in the right frontal lobe. Seizure meds and BP meds being adjusted.  PMH including dementia, hypertension, hyperlipidemia, diabetes, COPD, CKD 3, CAD status post stent, epilepsy, diastolic CHF, anxiety, anemia.    PT Comments  Patient much more lethargic today. Spoke with RN and apparently pt had not had her Keppra yesterday morning until after PT session. This morning she had it before PT arrived. Pt easily falling asleep during session and required max cues to maintain attention/alertness. Required +2 mod assist for OOB to chair today (compared to min assist 12/23). Son present observing session and expressed concern re: ?discharge home today. RN made aware. (Later also discussed with MD).    If plan is discharge home, recommend the following: Supervision due to cognitive status;Assist for transportation;Assistance with cooking/housework;Help with stairs or ramp for entrance;Direct supervision/assist for medications management;Direct supervision/assist for financial management;Two people to help with walking and/or transfers   Can travel by private vehicle     No  Equipment Recommendations  None recommended by PT    Recommendations for Other Services       Precautions / Restrictions Precautions Precautions: Fall Restrictions Weight Bearing Restrictions Per Provider Order: No     Mobility  Bed Mobility Overal bed mobility: Needs Assistance Bed Mobility: Supine to Sit Rolling: Max assist, +2 for physical assistance   Supine to sit: Mod assist, +2 for physical assistance     General bed mobility comments: incr time, pt with minimal initiation throughout bed mobility task due to lethargy    Transfers Overall transfer  level: Needs assistance Equipment used: Rolling walker (2 wheels) Transfers: Sit to/from Stand Sit to Stand: Mod assist, +2 physical assistance   Step pivot transfers: +2 physical assistance       General transfer comment: STS x 2 with increased time to rise and mod A.  Stand pivot to chair toward R side - fatigued easily and wanting to lean her forearms on hnadles of RW; cues for upright posture to incr ease of stepping; initiated stand to sit prematurely and safely assisted to sit on chair    Ambulation/Gait               General Gait Details: unable at this time   Stairs             Wheelchair Mobility     Tilt Bed    Modified Rankin (Stroke Patients Only)       Balance Overall balance assessment: Needs assistance Sitting-balance support: No upper extremity supported, Feet supported Sitting balance-Leahy Scale: Poor Sitting balance - Comments: posterior imbalance with fatigue   Standing balance support: Bilateral upper extremity supported Standing balance-Leahy Scale: Poor Standing balance comment: Needs RW                            Cognition Arousal: Lethargic Behavior During Therapy: WFL for tasks assessed/performed Overall Cognitive Status: Impaired/Different from baseline Area of Impairment: Memory, Following commands, Safety/judgement, Awareness, Problem solving, Orientation, Attention                 Orientation Level: Situation, Time (states it is 12/25) Current Attention Level: Sustained Memory: Decreased short-term memory Following Commands: Follows one step commands consistently, Follows multi-step commands inconsistently, Follows  one step commands with increased time Safety/Judgement: Decreased awareness of safety, Decreased awareness of deficits Awareness: Emergent Problem Solving: Slow processing, Requires tactile cues, Requires verbal cues, Decreased initiation General Comments: RN reports seizure meds already given  which may be attributing to pt sleepiness (pt was seen 12/23 prior to seizure meds @ 10:00), easily distracted requiring cues for redirection during session        Exercises General Exercises - Lower Extremity Ankle Circles/Pumps: AROM, Both, 10 reps, Seated Quad Sets: AROM, Both, 5 reps, Seated (legs elevated; began falling asleep) Long Arc Quad: AROM, Both, Seated, 10 reps Hip Flexion/Marching:  (attempted with pt falling asleep and not participating)    General Comments General comments (skin integrity, edema, etc.): Son present. Reports MD may send her home today and he does not feel she is ready.      Pertinent Vitals/Pain Pain Assessment Pain Assessment: Faces Faces Pain Scale: Hurts little more Pain Location: knees with standing Pain Descriptors / Indicators: Aching, Sore, Grimacing Pain Intervention(s): Limited activity within patient's tolerance    Home Living                          Prior Function            PT Goals (current goals can now be found in the care plan section) Acute Rehab PT Goals Patient Stated Goal: return home Time For Goal Achievement: 07/21/23 Potential to Achieve Goals: Fair Progress towards PT goals: Not progressing toward goals - comment (more lethargic and required incr assist)    Frequency    Min 1X/week      PT Plan      Co-evaluation PT/OT/SLP Co-Evaluation/Treatment: Yes Reason for Co-Treatment: For patient/therapist safety;To address functional/ADL transfers PT goals addressed during session: Mobility/safety with mobility;Balance;Strengthening/ROM        AM-PAC PT "6 Clicks" Mobility   Outcome Measure  Help needed turning from your back to your side while in a flat bed without using bedrails?: A Lot Help needed moving from lying on your back to sitting on the side of a flat bed without using bedrails?: Total Help needed moving to and from a bed to a chair (including a wheelchair)?: Total Help needed standing  up from a chair using your arms (e.g., wheelchair or bedside chair)?: Total Help needed to walk in hospital room?: Total Help needed climbing 3-5 steps with a railing? : Total 6 Click Score: 7    End of Session Equipment Utilized During Treatment: Gait belt Activity Tolerance: Patient limited by lethargy Patient left: with chair alarm set;in chair;with call bell/phone within reach;with nursing/sitter in room Nurse Communication: Mobility status;Need for lift equipment (if remains lethargic) PT Visit Diagnosis: Other abnormalities of gait and mobility (R26.89);Muscle weakness (generalized) (M62.81);Other symptoms and signs involving the nervous system (R29.898)     Time: 1030-1056 PT Time Calculation (min) (ACUTE ONLY): 26 min  Charges:    $Gait Training: 8-22 mins PT General Charges $$ ACUTE PT VISIT: 1 Visit                      Jerolyn Center, PT Acute Rehabilitation Services  Office [phone]    Zena Amos 07/11/2023, 11:53 AM

## 2023-07-11 NOTE — Plan of Care (Signed)
  Problem: Education: Goal: Ability to describe self-care measures that may prevent or decrease complications (Diabetes Survival Skills Education) will improve Outcome: Progressing Goal: Individualized Educational Video(s) Outcome: Progressing   Problem: Coping: Goal: Ability to adjust to condition or change in health will improve Outcome: Progressing   Problem: Fluid Volume: Goal: Ability to maintain a balanced intake and output will improve Outcome: Progressing   Problem: Health Behavior/Discharge Planning: Goal: Ability to identify and utilize available resources and services will improve Outcome: Progressing Goal: Ability to manage health-related needs will improve Outcome: Progressing   Problem: Metabolic: Goal: Ability to maintain appropriate glucose levels will improve Outcome: Progressing   Problem: Nutritional: Goal: Maintenance of adequate nutrition will improve Outcome: Progressing Goal: Progress toward achieving an optimal weight will improve Outcome: Progressing   Problem: Skin Integrity: Goal: Risk for impaired skin integrity will decrease Outcome: Progressing   Problem: Tissue Perfusion: Goal: Adequacy of tissue perfusion will improve Outcome: Progressing   Problem: Education: Goal: Knowledge of General Education information will improve Description: Including pain rating scale, medication(s)/side effects and non-pharmacologic comfort measures Outcome: Progressing   Problem: Health Behavior/Discharge Planning: Goal: Ability to manage health-related needs will improve Outcome: Progressing   Problem: Clinical Measurements: Goal: Ability to maintain clinical measurements within normal limits will improve Outcome: Progressing Goal: Will remain free from infection Outcome: Progressing Goal: Diagnostic test results will improve Outcome: Progressing Goal: Respiratory complications will improve Outcome: Progressing Goal: Cardiovascular complication will  be avoided Outcome: Progressing   Problem: Activity: Goal: Risk for activity intolerance will decrease Outcome: Progressing   Problem: Nutrition: Goal: Adequate nutrition will be maintained Outcome: Progressing   Problem: Coping: Goal: Level of anxiety will decrease Outcome: Progressing   Problem: Elimination: Goal: Will not experience complications related to bowel motility Outcome: Progressing Goal: Will not experience complications related to urinary retention Outcome: Progressing   Problem: Pain Management: Goal: General experience of comfort will improve Outcome: Progressing   Problem: Safety: Goal: Ability to remain free from injury will improve Outcome: Progressing   Problem: Skin Integrity: Goal: Risk for impaired skin integrity will decrease Outcome: Progressing   Problem: Education: Goal: Expressions of having a comfortable level of knowledge regarding the disease process will increase Outcome: Progressing   Problem: Coping: Goal: Ability to adjust to condition or change in health will improve Outcome: Progressing Goal: Ability to identify appropriate support needs will improve Outcome: Progressing   Problem: Health Behavior/Discharge Planning: Goal: Compliance with prescribed medication regimen will improve Outcome: Progressing   Problem: Medication: Goal: Risk for medication side effects will decrease Outcome: Progressing   Problem: Clinical Measurements: Goal: Complications related to the disease process, condition or treatment will be avoided or minimized Outcome: Progressing Goal: Diagnostic test results will improve Outcome: Progressing   Problem: Safety: Goal: Verbalization of understanding the information provided will improve Outcome: Progressing   Problem: Self-Concept: Goal: Level of anxiety will decrease Outcome: Progressing Goal: Ability to verbalize feelings about condition will improve Outcome: Progressing

## 2023-07-11 NOTE — Progress Notes (Signed)
Occupational Therapy Treatment Patient Details Name: Michelle [name] MRN: [id number] DOB: Jan 21, 1933 Today's Date: 07/11/2023   History of present illness 89 yo female presenting with AMS and seizures on 07/05/23. CT head showing Small volume subarachnoid hemorrhage in the right frontal lobe. PMH including dementia, hypertension, hyperlipidemia, diabetes, COPD, CKD 3, CAD status post stent, epilepsy, diastolic CHF, anxiety, anemia.   OT comments  Pt making progress towards goals, lethargic this session, needing incr cues to remain alert/attentive and to initiate tasks. Pt completing seated grooming/ADL tasks, cues to remain at midline as pt leaning posteriorly and to R. Pt needing mod A +2 for bed mobility and transfers with RW. Pt presenting with impairments listed below, will follow acutely. Continue to recommend HHOT at d/c.      If plan is discharge home, recommend the following:  A lot of help with walking and/or transfers;A lot of help with bathing/dressing/bathroom;Assistance with cooking/housework;Direct supervision/assist for medications management;Direct supervision/assist for financial management;Assist for transportation;Help with stairs or ramp for entrance   Equipment Recommendations  None recommended by OT    Recommendations for Other Services      Precautions / Restrictions Precautions Precautions: Fall Restrictions Weight Bearing Restrictions Per Provider Order: No       Mobility Bed Mobility Overal bed mobility: Needs Assistance Bed Mobility: Supine to Sit     Supine to sit: Mod assist, +2 for physical assistance     General bed mobility comments: incr time, pt with minimal initiation throughout bed mobility task    Transfers Overall transfer level: Needs assistance Equipment used: Rolling walker (2 wheels) Transfers: Sit to/from Stand Sit to Stand: Mod assist, +2 physical assistance                 Balance Overall balance assessment: Needs  assistance Sitting-balance support: No upper extremity supported, Feet supported Sitting balance-Leahy Scale: Fair Sitting balance - Comments: anterior and R lateral lean   Standing balance support: Bilateral upper extremity supported Standing balance-Leahy Scale: Poor                             ADL either performed or assessed with clinical judgement   ADL Overall ADL's : Needs assistance/impaired     Grooming: Brushing hair;Wash/dry face;Set up;Sitting Grooming Details (indicate cue type and reason): cues for thoroughness         Upper Body Dressing : Maximal assistance;Sitting       Toilet Transfer: Moderate assistance;+2 for physical assistance Toilet Transfer Details (indicate cue type and reason): simulated to chair         Functional mobility during ADLs: Moderate assistance;+2 for physical assistance;Rolling walker (2 wheels)      Extremity/Trunk Assessment Upper Extremity Assessment Upper Extremity Assessment: Generalized weakness   Lower Extremity Assessment Lower Extremity Assessment: Defer to PT evaluation        Vision   Vision Assessment?: No apparent visual deficits;Wears glasses for reading   Perception Perception Perception: Not tested   Praxis Praxis Praxis: Not tested    Cognition Arousal: Lethargic Behavior During Therapy: WFL for tasks assessed/performed Overall Cognitive Status: Impaired/Different from baseline Area of Impairment: Memory, Following commands, Safety/judgement, Awareness, Problem solving, Orientation, Attention                 Orientation Level: Situation, Time (states it is 12/25) Current Attention Level: Selective Memory: Decreased short-term memory Following Commands: Follows one step commands consistently, Follows multi-step commands inconsistently, Follows one  step commands with increased time Safety/Judgement: Decreased awareness of safety, Decreased awareness of deficits Awareness:  Emergent Problem Solving: Slow processing, Requires tactile cues, Requires verbal cues, Decreased initiation General Comments: RN reports an adjustment in seizure meds which may be attributing to pt sleepiness, iniitally getting son's name wrong but then able to correct once notified of error, easily distracted requiring cues for redirection during session        Exercises      Shoulder Instructions       General Comments VSS    Pertinent Vitals/ Pain       Pain Assessment Pain Assessment: No/denies pain  Home Living                                          Prior Functioning/Environment              Frequency  Min 1X/week        Progress Toward Goals  OT Goals(current goals can now be found in the care plan section)  Progress towards OT goals: Progressing toward goals  Acute Rehab OT Goals Patient Stated Goal: none stated OT Goal Formulation: With patient Time For Goal Achievement: 07/21/23 Potential to Achieve Goals: Good ADL Goals Pt Will Perform Grooming: with modified independence;sitting Pt Will Perform Upper Body Dressing: with set-up;with supervision;sitting Pt Will Transfer to Toilet: with min assist;stand pivot transfer;bedside commode Additional ADL Goal #1: Pt will perform bed mobility with Min A in preparation for ADLs  Plan      Co-evaluation                 AM-PAC OT "6 Clicks" Daily Activity     Outcome Measure   Help from another person eating meals?: A Little Help from another person taking care of personal grooming?: A Little Help from another person toileting, which includes using toliet, bedpan, or urinal?: A Lot Help from another person bathing (including washing, rinsing, drying)?: A Lot Help from another person to put on and taking off regular upper body clothing?: A Lot Help from another person to put on and taking off regular lower body clothing?: A Lot 6 Click Score: 14    End of Session Equipment  Utilized During Treatment: Gait belt;Rolling walker (2 wheels)  OT Visit Diagnosis: Unsteadiness on feet (R26.81);Other abnormalities of gait and mobility (R26.89);Muscle weakness (generalized) (M62.81)   Activity Tolerance Patient tolerated treatment well   Patient Left in chair;with call bell/phone within reach;with chair alarm set   Nurse Communication Mobility status        Time: 1025-1050 OT Time Calculation (min): 25 min  Charges: OT General Charges $OT Visit: 1 Visit OT Treatments $Self Care/Home Management : 8-22 mins  Carver Fila, OTD, OTR/L SecureChat Preferred Acute Rehab [phone]   Ife Vitelli K Koonce 07/11/2023, 11:04 AM

## 2023-07-12 DIAGNOSIS — G40919 Epilepsy, unspecified, intractable, without status epilepticus: Secondary | ICD-10-CM

## 2023-07-12 LAB — COMPREHENSIVE METABOLIC PANEL
ALT: 23 U/L (ref 0–44)
AST: 26 U/L (ref 15–41)
Albumin: 3.4 g/dL — ABNORMAL LOW (ref 3.5–5.0)
Alkaline Phosphatase: 49 U/L (ref 38–126)
Anion gap: 6 (ref 5–15)
BUN: 26 mg/dL — ABNORMAL HIGH (ref 8–23)
CO2: 23 mmol/L (ref 22–32)
Calcium: 9.5 mg/dL (ref 8.9–10.3)
Chloride: 105 mmol/L (ref 98–111)
Creatinine, Ser: 0.73 mg/dL (ref 0.44–1.00)
GFR, Estimated: >60 mL/min (ref >60)
Glucose, Bld: 109 mg/dL — ABNORMAL HIGH (ref 70–99)
Potassium: 4.4 mmol/L (ref 3.5–5.1)
Sodium: 134 mmol/L — ABNORMAL LOW (ref 135–145)
Total Bilirubin: 0.5 mg/dL (ref <1.2)
Total Protein: 7 g/dL (ref 6.5–8.1)

## 2023-07-12 LAB — CBC WITH DIFFERENTIAL/PLATELET
Abs Immature Granulocytes: 0.03 10*3/uL (ref 0.00–0.07)
Basophils Absolute: 0.1 10*3/uL (ref 0.0–0.1)
Basophils Relative: 1 %
Eosinophils Absolute: 0.1 10*3/uL (ref 0.0–0.5)
Eosinophils Relative: 1 %
HCT: 40.5 % (ref 36.0–46.0)
Hemoglobin: 13.1 g/dL (ref 12.0–15.0)
Immature Granulocytes: 1 %
Lymphocytes Relative: 32 %
Lymphs Abs: 2.1 10*3/uL (ref 0.7–4.0)
MCH: 27.3 pg (ref 26.0–34.0)
MCHC: 32.3 g/dL (ref 30.0–36.0)
MCV: 84.4 fL (ref 80.0–100.0)
Monocytes Absolute: 0.6 10*3/uL (ref 0.1–1.0)
Monocytes Relative: 10 %
Neutro Abs: 3.6 10*3/uL (ref 1.7–7.7)
Neutrophils Relative %: 55 %
Platelets: 273 10*3/uL (ref 150–400)
RBC: 4.8 MIL/uL (ref 3.87–5.11)
RDW: 14.6 % (ref 11.5–15.5)
WBC: 6.4 10*3/uL (ref 4.0–10.5)
nRBC: 0 % (ref 0.0–0.2)

## 2023-07-12 LAB — AMMONIA: Ammonia: 12 umol/L (ref 9–35)

## 2023-07-12 LAB — GLUCOSE, CAPILLARY
Glucose-Capillary: 122 mg/dL — ABNORMAL HIGH (ref 70–99)
Glucose-Capillary: 126 mg/dL — ABNORMAL HIGH (ref 70–99)
Glucose-Capillary: 167 mg/dL — ABNORMAL HIGH (ref 70–99)
Glucose-Capillary: 148 mg/dL — ABNORMAL HIGH (ref 70–99)

## 2023-07-12 MED ORDER — ALUM & MAG HYDROXIDE-SIMETH 200-200-20 MG/5ML PO SUSP
30.0000 mL | ORAL | Status: DC | PRN
  Administered 2023-07-12: 30 mL via ORAL
  Filled 2023-07-12: qty 30

## 2023-07-12 MED ORDER — QUETIAPINE FUMARATE 25 MG PO TABS
12.5000 mg | ORAL_TABLET | Freq: Every evening | ORAL | Status: DC
  Administered 2023-07-12 – 2023-07-23 (×12): 12.5 mg via ORAL
  Filled 2023-07-12 (×12): qty 1

## 2023-07-12 NOTE — Plan of Care (Signed)
  Problem: Skin Integrity: Goal: Risk for impaired skin integrity will decrease Outcome: Progressing   Problem: Education: Goal: Knowledge of General Education information will improve Description: Including pain rating scale, medication(s)/side effects and non-pharmacologic comfort measures Outcome: Progressing   Problem: Clinical Measurements: Goal: Respiratory complications will improve Outcome: Progressing Goal: Cardiovascular complication will be avoided Outcome: Progressing   Problem: Coping: Goal: Level of anxiety will decrease Outcome: Progressing   Problem: Elimination: Goal: Will not experience complications related to bowel motility Outcome: Progressing Goal: Will not experience complications related to urinary retention Outcome: Progressing   Problem: Self-Concept: Goal: Level of anxiety will decrease Outcome: Progressing

## 2023-07-12 NOTE — Progress Notes (Signed)
PROGRESS NOTE    Michelle [name]  ZOX:[id number] DOB: 26-Apr-1933 DOA: 07/05/2023 PCP: Shelva Majestic, MD  Chief Complaint  Patient presents with   Seizures   Altered Mental Status    Hospital Course:  Michelle [name] is 89 y.o. female with dementia, wheelchair-bound at baseline, hypertension, hyperlipidemia, diabetes, seizure disorder, COPD, CKD stage III, CAD status post stenting, epilepsy, diastolic CHF, anxiety, anemia, who presented with altered mental status and seizure, found by family.  Had recent admissions in October or November for breakthrough seizures.  In the ED patient was found to be tachycardic, tachypneic, UA consistent with infection, CT head revealed small volume subarachnoid hemorrhage in the right frontal lobe.  Neurology was consulted.  Patient received antibiotics, Keppra, Ativan.  She underwent EEG which revealed no seizure.  It was suspected that breakthrough seizure was secondary to acute toxic metabolic encephalopathy compounded by history of dementia.  Neurology recommends following up with outpatient team who can consider increasing Lamictal and weaning her off of Keppra.  Blood pressure medications have been titrated.  Patient is status post 5days antibiotics.  Appears plan was to discharge on 12/25 however son has concerns with patient returning home and has requested for SNF placement.  TOC has been consulted.  Subjective: Is very drowsy on arrival today.  She requires significant prompting to answer questions.  Her son is at bedside.  He reports that she has been this drowsy for many days.  On working with physical therapy patient was unable to sit on edge of bed.  Son reports prior to hospitalization she was able to assist with transfers.  At her current level of deconditioning he will not be able to maintain her needs at home.   Objective: Vitals:   07/11/23 2347 07/12/23 0356 07/12/23 0613 07/12/23 0650  BP: (!) 194/65 (!) 155/63 (!) 155/60 (!) 157/66   Pulse: 65 65 60 65  Resp: 19 14    Temp: 97.9 F (36.6 C) 98 F (36.7 C)    TempSrc: Oral Oral    SpO2: 100% 100%    Weight:      Height:        Intake/Output Summary (Last 24 hours) at 07/12/2023 0749 Last data filed at 07/12/2023 0355 Gross per 24 hour  Intake 3 ml  Output 1350 ml  Net -1347 ml   Filed Weights   07/05/23 0905  Weight: 75.8 kg    Examination: General exam: Appears calm and comfortable, NAD  Respiratory system: No work of breathing, symmetric chest wall expansion Cardiovascular system: S1 & S2 heard, RRR.  Gastrointestinal system: Abdomen is nondistended, soft and nontender.  Neuro: drowsy but arousable. Requires significant prompting Extremities: Symmetric, expected ROM Skin: No rashes, lesions Psychiatry:  calm. Difficult to assess otherwise  Assessment & Plan:  Active Problems:   Type 2 diabetes mellitus without complication, with long-term current use of insulin (HCC)   Hyperlipidemia associated with type 2 diabetes mellitus (HCC)   Essential hypertension   COPD (chronic obstructive pulmonary disease) (HCC)   Chronic diastolic CHF (congestive heart failure) (HCC)   CAD- RCA PCI '90s, STEMI-RCA DES 02/18/14   Anxiety state   CKD (chronic kidney disease), stage III (HCC)   Anemia, iron deficiency   Dementia (HCC)   Epilepsy (HCC)   DNR (do not resuscitate)/DNI(Do Not Intubate)   Subarachnoid hemorrhage (HCC)   Seizure (HCC)    Breakthrough seizure, superimposed on known seizure disorder Acute toxic metabolic encephalopathy, superimposed on chronic dementia Anxiety disorder -  Breakthrough seizure appears to be secondary to UTI - Mental status appears to be improving to baseline - EEG without epileptiform activity - Neurology consulted, AED titration per neuro. - Continue with current dose of Keppra and Lamictal - Outpatient follow-up with neurology for increasing Lamictal and tapering off Keppra as they see fit - Continue home dose  Seroquel - Continue PT/OT, initially planned for home health.  Family now requesting discharge to SNF  Drowsiness --Patient with persistent drowsiness.  Multifactorial given breakthrough seizures and dementia.  Acute worsening is likely secondary to new addition of Keppra.  However it is at a low dose. Will add NH4 today. - Will decrease current dose Seroquel.  Hesitant to further alter AEDs as she has been seizure-free since this new regimen. Monitor closely  Small right frontal subarachnoid hemorrhage - Seen on arrival.  Concerning for possible amyloid etiology - Workup with CTA head and neck - Seen by neurology - Avoid anticoagulants  Hypertension - Blood pressures have been low to normotensive.  Continue to titrate medications as tolerated - Needs close outpatient follow-up with PCP and blood pressure monitoring  Klebsiella UTI - Status posttreatment  CAD Hyperlipidemia - STEMI status post stent 2015 - Antiplatelets held secondary to subarachnoid hemorrhage - Neurology has cleared the patient to resume 81 as monotherapy and recommended to discontinue Plavix - Has been discussed with the patient's son at bedside  Chronic diastolic heart failure - EF 60 to 65%, on Lasix as needed - Continue monitor - GDMT as BP tolerates  COPD, not in exacerbation - Continue home inhaler - On room air  Ambulatory dysfunction - Wheelchair-bound at baseline - PT/OT as above - Likely discharge to SNF  Goals of care - Currently DNR, family requesting intubation if required procedures. - family considering hospice.  Palliative care consulted.  Diabetes - Continue sliding scale insulin, titrate up as tolerated  Endometrial mass - Known from prior to arrival - Followed by Gyn Onc, patient has previously denied surgical intervention or endometrial biopsy - Has intermittent vaginal bleeding, appears well-controlled with Provera  Severe deconditioning - Prior to this hospitalization  patient was able to assist with transfers at home.  She is primarily cared for by her adult son who also has disabilities - Per PT evaluation patient is now 2 person max assist.  Would be unable to be managed at home with current care plan. - TOC consult for SNF placement.  DVT prophylaxis: SCDs    Code Status: Do not attempt resuscitation (DNR) PRE-ARREST INTERVENTIONS DESIRED Family Communication: Son [name] at bedside, discussed extensively. Disposition:  Status is: Inpatient, SNF at DC. TOC working on it    Consultants:  Neuro  Procedures:  N/a  Antimicrobials:  Anti-infectives (From admission, onward)    Start     Dose/Rate Route Frequency Ordered Stop   07/06/23 1200  cefTRIAXone (ROCEPHIN) 1 g in sodium chloride 0.9 % 100 mL IVPB  Status:  Discontinued        1 g 200 mL/hr over 30 Minutes Intravenous Every 24 hours 07/05/23 1427 07/11/23 1148   07/05/23 1230  cefTRIAXone (ROCEPHIN) 1 g in sodium chloride 0.9 % 100 mL IVPB        1 g 200 mL/hr over 30 Minutes Intravenous  Once 07/05/23 1216 07/05/23 1324       Data Reviewed: I have personally reviewed following labs and imaging studies CBC: Recent Labs  Lab 07/05/23 0908 07/06/23 0610  WBC 8.9 10.0  NEUTROABS 6.9  --  HGB 12.7 11.5*  HCT 41.2 36.3  MCV 86.7 85.4  PLT 360 338   Basic Metabolic Panel: Recent Labs  Lab 07/05/23 0908 07/06/23 0610  NA 138 138  K 4.1 3.6  CL 105 109  CO2 20* 23  GLUCOSE 168* 91  BUN 26* 21  CREATININE 0.84 0.75  CALCIUM 10.1 9.2   GFR: Estimated Creatinine Clearance: 49.5 mL/min (by C-G formula based on SCr of 0.75 mg/dL). Liver Function Tests: Recent Labs  Lab 07/05/23 0908 07/06/23 0610  AST 25 20  ALT 18 16  ALKPHOS 58 48  BILITOT 0.5 0.5  PROT 7.4 6.7  ALBUMIN 3.6 3.1*   CBG: Recent Labs  Lab 07/11/23 1158 07/11/23 1622 07/11/23 2029 07/11/23 2348 07/12/23 0357  GLUCAP 146* 186* 113* 143* 122*    Recent Results (from the past 240 hours)  Urine  Culture     Status: Abnormal   Collection Time: 07/05/23 12:17 PM   Specimen: Urine, Clean Catch  Result Value Ref Range Status   Specimen Description URINE, CLEAN CATCH  Final   Special Requests   Final    NONE Performed at [facility], [address] [place], [place] [address]    Culture >=100,000 COLONIES/mL KLEBSIELLA AEROGENES (A)  Final   Report Status 07/07/2023 FINAL  Final   Organism ID, Bacteria KLEBSIELLA AEROGENES (A)  Final      Susceptibility   Klebsiella aerogenes - MIC*    CEFEPIME <=0.12 SENSITIVE Sensitive     CEFTRIAXONE <=0.25 SENSITIVE Sensitive     CIPROFLOXACIN <=0.25 SENSITIVE Sensitive     GENTAMICIN <=1 SENSITIVE Sensitive     IMIPENEM 2 SENSITIVE Sensitive     NITROFURANTOIN 64 INTERMEDIATE Intermediate     TRIMETH/SULFA <=20 SENSITIVE Sensitive     PIP/TAZO <=4 SENSITIVE Sensitive ug/mL    * >=100,000 COLONIES/mL KLEBSIELLA AEROGENES     Radiology Studies: No results found.  Scheduled Meds:  amLODipine  2.5 mg Oral Daily   carvedilol  12.5 mg Oral BID   enalapril  20 mg Oral QHS   fenofibrate  54 mg Oral Daily   hydrALAZINE  10 mg Oral TID   icosapent Ethyl  1 g Oral BID WC   And   icosapent Ethyl  2 g Oral QHS   insulin aspart  0-15 Units Subcutaneous Q4H   lamoTRIgine  50 mg Oral BID   levETIRAcetam  500 mg Oral Q12H   medroxyPROGESTERone  10 mg Oral Daily   mometasone-formoterol  2 puff Inhalation BID   nortriptyline  10 mg Oral QHS   pantoprazole  40 mg Oral Daily   QUEtiapine  25 mg Oral QPM   rosuvastatin  40 mg Oral Daily   sertraline  100 mg Oral QHS   sodium chloride flush  3 mL Intravenous Q12H   umeclidinium bromide  1 puff Inhalation Daily   Continuous Infusions:   LOS: 6 days    Time spent:   Debarah Crape, DO [facility]  To contact the attending physician between 7A-7P please use Epic Chat. To contact the covering physician during after hours 7P-7A, please review Amion.   07/12/2023, 7:49  AM   *This document has been created with the assistance of dictation software. Please excuse typographical errors. *

## 2023-07-12 NOTE — Plan of Care (Signed)
  Problem: Education: Goal: Ability to describe self-care measures that may prevent or decrease complications (Diabetes Survival Skills Education) will improve Outcome: Progressing Goal: Individualized Educational Video(s) Outcome: Progressing   Problem: Coping: Goal: Ability to adjust to condition or change in health will improve Outcome: Progressing   Problem: Fluid Volume: Goal: Ability to maintain a balanced intake and output will improve Outcome: Progressing   Problem: Health Behavior/Discharge Planning: Goal: Ability to identify and utilize available resources and services will improve Outcome: Progressing Goal: Ability to manage health-related needs will improve Outcome: Progressing   Problem: Metabolic: Goal: Ability to maintain appropriate glucose levels will improve Outcome: Progressing   Problem: Nutritional: Goal: Maintenance of adequate nutrition will improve Outcome: Progressing Goal: Progress toward achieving an optimal weight will improve Outcome: Progressing   Problem: Skin Integrity: Goal: Risk for impaired skin integrity will decrease Outcome: Progressing   Problem: Tissue Perfusion: Goal: Adequacy of tissue perfusion will improve Outcome: Progressing   Problem: Education: Goal: Knowledge of General Education information will improve Description: Including pain rating scale, medication(s)/side effects and non-pharmacologic comfort measures Outcome: Progressing   Problem: Health Behavior/Discharge Planning: Goal: Ability to manage health-related needs will improve Outcome: Progressing   Problem: Clinical Measurements: Goal: Ability to maintain clinical measurements within normal limits will improve Outcome: Progressing Goal: Will remain free from infection Outcome: Progressing Goal: Diagnostic test results will improve Outcome: Progressing Goal: Respiratory complications will improve Outcome: Progressing Goal: Cardiovascular complication will  be avoided Outcome: Progressing   Problem: Activity: Goal: Risk for activity intolerance will decrease Outcome: Progressing   Problem: Nutrition: Goal: Adequate nutrition will be maintained Outcome: Progressing   Problem: Coping: Goal: Level of anxiety will decrease Outcome: Progressing   Problem: Elimination: Goal: Will not experience complications related to bowel motility Outcome: Progressing Goal: Will not experience complications related to urinary retention Outcome: Progressing   Problem: Pain Management: Goal: General experience of comfort will improve Outcome: Progressing   Problem: Safety: Goal: Ability to remain free from injury will improve Outcome: Progressing   Problem: Skin Integrity: Goal: Risk for impaired skin integrity will decrease Outcome: Progressing   Problem: Education: Goal: Expressions of having a comfortable level of knowledge regarding the disease process will increase Outcome: Progressing   Problem: Coping: Goal: Ability to adjust to condition or change in health will improve Outcome: Progressing Goal: Ability to identify appropriate support needs will improve Outcome: Progressing   Problem: Health Behavior/Discharge Planning: Goal: Compliance with prescribed medication regimen will improve Outcome: Progressing   Problem: Medication: Goal: Risk for medication side effects will decrease Outcome: Progressing   Problem: Clinical Measurements: Goal: Complications related to the disease process, condition or treatment will be avoided or minimized Outcome: Progressing Goal: Diagnostic test results will improve Outcome: Progressing   Problem: Safety: Goal: Verbalization of understanding the information provided will improve Outcome: Progressing   Problem: Self-Concept: Goal: Level of anxiety will decrease Outcome: Progressing Goal: Ability to verbalize feelings about condition will improve Outcome: Progressing

## 2023-07-13 DIAGNOSIS — Z515 Encounter for palliative care: Secondary | ICD-10-CM | POA: Diagnosis not present

## 2023-07-13 DIAGNOSIS — Z7189 Other specified counseling: Secondary | ICD-10-CM | POA: Diagnosis not present

## 2023-07-13 DIAGNOSIS — R569 Unspecified convulsions: Secondary | ICD-10-CM | POA: Diagnosis not present

## 2023-07-13 DIAGNOSIS — I609 Nontraumatic subarachnoid hemorrhage, unspecified: Secondary | ICD-10-CM | POA: Diagnosis not present

## 2023-07-13 LAB — GLUCOSE, CAPILLARY
Glucose-Capillary: 141 mg/dL — ABNORMAL HIGH (ref 70–99)
Glucose-Capillary: 149 mg/dL — ABNORMAL HIGH (ref 70–99)

## 2023-07-13 NOTE — Progress Notes (Addendum)
Occupational Therapy Treatment Patient Details Name: Michelle [name] MRN: [id number] DOB: [birth year] Today's Date: 07/13/2023   History of present illness 89 yo female presenting with AMS and seizures on 07/05/23. CT head showing Small volume subarachnoid hemorrhage in the right frontal lobe. Seizure meds and BP meds being adjusted.  PMH including dementia, hypertension, hyperlipidemia, diabetes, COPD, CKD 3, CAD status post stent, epilepsy, diastolic CHF, anxiety, anemia.   OT comments  Patient alert and willing to participate with OT/PT treatment. Patient demonstrating gains with bed mobility requiring min assist to get to EOB. Patient performed transfer to chair with mod assist +2 to stand and mod assist of one for transfer. Patient able to perform grooming tasks seated at sink with setup and min assists to complete. [name] arrived at end of session and informed of progress on this date. Patient will benefit from intensive inpatient follow up therapy, >3 hours/day to address bathing, dressing, and functional transfers to allow for safe discharge home. Acute OT to continue to follow for bathing, dressing, and functional transfers.       If plan is discharge home, recommend the following:  A lot of help with walking and/or transfers;A lot of help with bathing/dressing/bathroom;Assistance with cooking/housework;Direct supervision/assist for medications management;Direct supervision/assist for financial management;Assist for transportation;Help with stairs or ramp for entrance   Equipment Recommendations  None recommended by OT    Recommendations for Other Services      Precautions / Restrictions Precautions Precautions: Fall Restrictions Weight Bearing Restrictions Per Provider Order: No       Mobility Bed Mobility Overal bed mobility: Needs Assistance Bed Mobility: Supine to Sit     Supine to sit: Min assist     General bed mobility comments: able to get to EOB with min  assist, increased time, and verbal cues    Transfers Overall transfer level: Needs assistance Equipment used: Rolling walker (2 wheels) Transfers: Sit to/from Stand, Bed to chair/wheelchair/BSC Sit to Stand: Mod assist     Step pivot transfers: Mod assist     General transfer comment: +2 assist to stand from EOB but able to stand from recliner with mod assist of 1, cues for hand placement     Balance Overall balance assessment: Needs assistance Sitting-balance support: No upper extremity supported, Feet supported Sitting balance-Leahy Scale: Poor Sitting balance - Comments: supervision for sitting balance   Standing balance support: Bilateral upper extremity supported Standing balance-Leahy Scale: Poor Standing balance comment: Needs RW                           ADL either performed or assessed with clinical judgement   ADL Overall ADL's : Needs assistance/impaired     Grooming: Wash/dry hands;Wash/dry face;Oral care;Brushing hair;Minimal assistance;Sitting Grooming Details (indicate cue type and reason): assistance to complete                               General ADL Comments: unable to stand for grooming trasks    Extremity/Trunk Assessment              Vision       Perception     Praxis      Cognition Arousal: Alert Behavior During Therapy: WFL for tasks assessed/performed Overall Cognitive Status: Impaired/Different from baseline Area of Impairment: Memory, Following commands, Safety/judgement, Awareness, Problem solving, Orientation, Attention  Orientation Level: Time, Situation (believed she went home yesterday and came back) Current Attention Level: Sustained Memory: Decreased short-term memory Following Commands: Follows one step commands consistently, Follows multi-step commands inconsistently, Follows one step commands with increased time Safety/Judgement: Decreased awareness of safety, Decreased  awareness of deficits Awareness: Emergent Problem Solving: Slow processing, Requires tactile cues, Requires verbal cues, Decreased initiation General Comments: alert and able to follow directions with increased time        Exercises      Shoulder Instructions       General Comments      Pertinent Vitals/ Pain       Pain Assessment Pain Assessment: Faces Faces Pain Scale: Hurts little more Pain Location: knees with standing Pain Descriptors / Indicators: Aching, Sore, Grimacing Pain Intervention(s): Limited activity within patient's tolerance, Monitored during session, Repositioned  Home Living                                          Prior Functioning/Environment              Frequency  Min 1X/week        Progress Toward Goals  OT Goals(current goals can now be found in the care plan section)  Progress towards OT goals: Progressing toward goals  Acute Rehab OT Goals Patient Stated Goal: go home OT Goal Formulation: With patient Time For Goal Achievement: 07/21/23 Potential to Achieve Goals: Good ADL Goals Pt Will Perform Grooming: with modified independence;sitting Pt Will Perform Lower Body Bathing: with min assist;sit to/from stand Pt Will Perform Upper Body Dressing: with set-up;with supervision;sitting Pt Will Perform Lower Body Dressing: with min assist;sit to/from stand Pt Will Transfer to Toilet: with min assist;stand pivot transfer;bedside commode Pt Will Perform Toileting - Clothing Manipulation and hygiene: with contact guard assist;sit to/from stand Additional ADL Goal #1: Pt will perform bed mobility with Min A in preparation for ADLs Additional ADL Goal #2: Pt will follow 100% of 1 step commands to demonstrate improved cognition for ADLs  Plan      Co-evaluation    PT/OT/SLP Co-Evaluation/Treatment: Yes Reason for Co-Treatment: For patient/therapist safety;To address functional/ADL transfers   OT goals addressed during  session: ADL's and self-care      AM-PAC OT "6 Clicks" Daily Activity     Outcome Measure   Help from another person eating meals?: A Little Help from another person taking care of personal grooming?: A Little Help from another person toileting, which includes using toliet, bedpan, or urinal?: A Lot Help from another person bathing (including washing, rinsing, drying)?: A Lot Help from another person to put on and taking off regular upper body clothing?: A Lot Help from another person to put on and taking off regular lower body clothing?: A Lot 6 Click Score: 14    End of Session Equipment Utilized During Treatment: Gait belt;Rolling walker (2 wheels)  OT Visit Diagnosis: Unsteadiness on feet (R26.81);Other abnormalities of gait and mobility (R26.89);Muscle weakness (generalized) (M62.81) Pain - Right/Left: Right Pain - part of body: Knee   Activity Tolerance Patient tolerated treatment well   Patient Left in chair;with call bell/phone within reach;with chair alarm set;with family/visitor present   Nurse Communication Mobility status        Time: 1610-9604 OT Time Calculation (min): 27 min  Charges: OT General Charges $OT Visit: 1 Visit OT Treatments $Self Care/Home Management : 8-22 mins  Alfonse Flavors, OTA Acute Rehabilitation Services  Office [phone]   Dewain Penning 07/13/2023, 11:49 AM

## 2023-07-13 NOTE — Plan of Care (Signed)
  Problem: Health Behavior/Discharge Planning: Goal: Ability to identify and utilize available resources and services will improve Outcome: Progressing   Problem: Metabolic: Goal: Ability to maintain appropriate glucose levels will improve Outcome: Progressing   Problem: Nutritional: Goal: Maintenance of adequate nutrition will improve Outcome: Progressing   Problem: Skin Integrity: Goal: Risk for impaired skin integrity will decrease Outcome: Progressing   Problem: Education: Goal: Knowledge of General Education information will improve Description: Including pain rating scale, medication(s)/side effects and non-pharmacologic comfort measures Outcome: Progressing   Problem: Clinical Measurements: Goal: Ability to maintain clinical measurements within normal limits will improve Outcome: Progressing   Problem: Activity: Goal: Risk for activity intolerance will decrease Outcome: Progressing   Problem: Nutrition: Goal: Adequate nutrition will be maintained Outcome: Progressing

## 2023-07-13 NOTE — Progress Notes (Signed)
PROGRESS NOTE  Michelle [name] IHK:[id number] DOB: 02/24/1933 DOA: 07/05/2023 PCP: Shelva Majestic, MD  Brief History   Michelle [name] is 89 y.o. female with dementia, wheelchair-bound at baseline, hypertension, hyperlipidemia, diabetes, seizure disorder, COPD, CKD stage III, CAD status post stenting, epilepsy, diastolic CHF, anxiety, anemia, who presented with altered mental status and seizure, found by family.  Had recent admissions in October or November for breakthrough seizures.  In the ED patient was found to be tachycardic, tachypneic, UA consistent with infection, CT head revealed small volume subarachnoid hemorrhage in the right frontal lobe.  Neurology was consulted.  Patient received antibiotics, Keppra, Ativan.  She underwent EEG which revealed no seizure.  It was suspected that breakthrough seizure was secondary to acute toxic metabolic encephalopathy compounded by history of dementia.  Neurology recommends following up with outpatient team who can consider increasing Lamictal and weaning her off of Keppra.  Blood pressure medications have been titrated.  Patient is status post 5days antibiotics.  Appears plan was to discharge on 12/25 however son has concerns with patient returning home and has requested for SNF placement.  TOC has been consulted.   A & P  Active Problems:   Type 2 diabetes mellitus without complication, with long-term current use of insulin (HCC)   Hyperlipidemia associated with type 2 diabetes mellitus (HCC)   Essential hypertension   COPD (chronic obstructive pulmonary disease) (HCC)   Chronic diastolic CHF (congestive heart failure) (HCC)   CAD- RCA PCI '90s, STEMI-RCA DES 02/18/14   Anxiety state   CKD (chronic kidney disease), stage III (HCC)   Anemia, iron deficiency   Dementia (HCC)   Epilepsy (HCC)   DNR (do not resuscitate)/DNI(Do Not Intubate)   Subarachnoid hemorrhage (HCC)   Seizure (HCC)       Breakthrough seizure, superimposed on known seizure  disorder Acute toxic metabolic encephalopathy, superimposed on chronic dementia Anxiety disorder - Breakthrough seizure appears to be secondary to UTI - Mental status appears to be improving to baseline - EEG without epileptiform activity - Neurology consulted, AED titration per neuro. - Continue with current dose of Keppra and Lamictal - Outpatient follow-up with neurology for increasing Lamictal and tapering off Keppra as they see fit - Continue home dose Seroquel - Continue PT/OT, initially planned for home health.  Family now requesting discharge to SNF   Drowsiness --Patient with improved level of consciousness today, although still somnolent. Multifactorial given breakthrough seizures and dementia.  Acute worsening is likely secondary to new addition of Keppra.  However it is at a low dose. - Continue Seroquel 12.5 q hs.  Hesitant to further alter AEDs as she has been seizure-free since this new regimen. Monitor closely   Small right frontal subarachnoid hemorrhage - Seen on arrival.  Concerning for possible amyloid etiology - Workup with CTA head and neck - Seen by neurology - Avoid anticoagulants   Hypertension - Blood pressures have been low to normotensive.  Continue to titrate medications as tolerated - Needs close outpatient follow-up with PCP and blood pressure monitoring   Klebsiella UTI - Status posttreatment   CAD Hyperlipidemia - STEMI status post stent 2015 - Antiplatelets held secondary to subarachnoid hemorrhage - Neurology has cleared the patient to resume 81 as monotherapy and recommended to discontinue Plavix - Has been discussed with the patient's son at bedside   Chronic diastolic heart failure - EF 60 to 65%, on Lasix as needed - Continue monitor - GDMT as BP tolerates   COPD, not  in exacerbation - Continue home inhaler - On room air   Ambulatory dysfunction - Wheelchair-bound at baseline - PT/OT as above - Likely discharge to SNF   Goals of  care - Currently DNR, family requesting intubation if required procedures. - family considering hospice.  Palliative care consulted.   Diabetes - Continue sliding scale insulin, titrate up as tolerated   Endometrial mass - Known from prior to arrival - Followed by Gyn Onc, patient has previously denied surgical intervention or endometrial biopsy - Has intermittent vaginal bleeding, appears well-controlled with Provera   Severe deconditioning - Prior to this hospitalization patient was able to assist with transfers at home.  She is primarily cared for by her adult son who also has disabilities - Per PT evaluation patient is now 2 person max assist.  Would be unable to be managed at home with current care plan. - TOC consult for SNF placement.   DVT prophylaxis: SCDs    Code Status: Do not attempt resuscitation (DNR) PRE-ARREST INTERVENTIONS DESIRED Family Communication: Son [name] at bedside, discussed extensively. Disposition:  Status is: Inpatient, SNF at DC. TOC working on it     Consultants:  Neuro   Procedures:  N/a   Antimicrobials:  Anti-infectives(From admission, onward)      I have seen and examined this patient myself. I have spent 34 minutes in her evaluation and care.  Brooks Stotz, DO [facility] Direct contact: see [email or web address]  7PM-7AM contact night coverage as above 07/13/2023, 2:08 PM  LOS: 7 days    Objective   Vitals:  Vitals:   07/13/23 0846 07/13/23 1039  BP:  (!) 115/42  Pulse:  64  Resp:  17  Temp:  98 F (36.7 C)  SpO2: 100% 100%    Exam:  Constitutional:  Appears calm and comfortable Respiratory:  CTA bilaterally, no w/r/r.  Respiratory effort normal. No retractions or accessory muscle use Cardiovascular:  RRR, no m/r/g No LE extremity edema   Normal pedal pulses Abdomen:  Abdomen appears normal; no tenderness or masses No hernias No HSM Musculoskeletal:  Digits/nails BUE: no clubbing, cyanosis, petechiae,  infection exam of joints, bones, muscles of at least one of following: head/neck, RUE, LUE, RLE, LLE   strength and tone normal, no atrophy, no abnormal movements No tenderness, masses Normal ROM, no contractures Skin:  No rashes, lesions, ulcers palpation of skin: no induration or nodules Neurologic:  CN 2-12 intact Sensation all 4 extremities intact Psychiatric:  Mental status Mood, affect appropriate Orientation to person, place, time  judgment and insight appear intact    Anti-infectives (From admission, onward)    Start     Dose/Rate Route Frequency Ordered Stop   07/06/23 1200  cefTRIAXone (ROCEPHIN) 1 g in sodium chloride 0.9 % 100 mL IVPB  Status:  Discontinued        1 g 200 mL/hr over 30 Minutes Intravenous Every 24 hours 07/05/23 1427 07/11/23 1148   07/05/23 1230  cefTRIAXone (ROCEPHIN) 1 g in sodium chloride 0.9 % 100 mL IVPB        1 g 200 mL/hr over 30 Minutes Intravenous  Once 07/05/23 1216 07/05/23 1324       I have personally reviewed the following:   Today's Data  Vitals  Lab Data  BMP, CBC  Micro Data  Urine culture: Klebsiella aerogenes, sensitive to all except nitrofurantoin.  Imaging  CTA head and neck CXR CT head CT C- Spine  Cardiology Data  EKG  Scheduled Meds:  amLODipine  2.5 mg Oral Daily   carvedilol  12.5 mg Oral BID   enalapril  20 mg Oral QHS   fenofibrate  54 mg Oral Daily   hydrALAZINE  10 mg Oral TID   icosapent Ethyl  1 g Oral BID WC   And   icosapent Ethyl  2 g Oral QHS   insulin aspart  0-15 Units Subcutaneous Q4H   lamoTRIgine  50 mg Oral BID   levETIRAcetam  500 mg Oral Q12H   medroxyPROGESTERone  10 mg Oral Daily   mometasone-formoterol  2 puff Inhalation BID   nortriptyline  10 mg Oral QHS   pantoprazole  40 mg Oral Daily   QUEtiapine  12.5 mg Oral QPM   rosuvastatin  40 mg Oral Daily   sertraline  100 mg Oral QHS   sodium chloride flush  3 mL Intravenous Q12H   umeclidinium bromide  1 puff Inhalation  Daily    Active Problems:   Type 2 diabetes mellitus without complication, with long-term current use of insulin (HCC)   Hyperlipidemia associated with type 2 diabetes mellitus (HCC)   Essential hypertension   COPD (chronic obstructive pulmonary disease) (HCC)   Chronic diastolic CHF (congestive heart failure) (HCC)   CAD- RCA PCI '90s, STEMI-RCA DES 02/18/14   Anxiety state   CKD (chronic kidney disease), stage III (HCC)   Anemia, iron deficiency   Dementia (HCC)   Epilepsy (HCC)   DNR (do not resuscitate)/DNI(Do Not Intubate)   Subarachnoid hemorrhage (HCC)   Seizure (HCC)   LOS: 7 days

## 2023-07-13 NOTE — TOC Progression Note (Signed)
Transition of Care [facility]) - Progression Note    Patient Details  Name: Michelle [name] MRN: [id number] Date of Birth: [birth year]  Transition of [facility]) CM/SW Contact  Kermit Balo, RN Phone Number: 07/13/2023, 3:50 PM  Clinical Narrative:     Recs changing to CIR. CIR to evaluate tomorrow. Sons need pt to be more independent to return home. TOC following.   Expected Discharge Plan: Home w Home Health Services Barriers to Discharge: Continued Medical Work up  Expected Discharge Plan and Services   Discharge Planning Services: CM Consult Post Acute Care Choice: Home Health Living arrangements for the past 2 months: Single Family Home                                       Social Determinants of Health (SDOH) Interventions SDOH Screenings   Food Insecurity: Patient Unable To Answer (07/05/2023)  Housing: Patient Unable To Answer (07/05/2023)  Transportation Needs: Patient Unable To Answer (07/05/2023)  Utilities: Patient Unable To Answer (07/05/2023)  Depression (PHQ2-9): Medium Risk (06/26/2023)  Financial Resource Strain: Low Risk  (06/26/2023)  Physical Activity: Insufficiently Active (06/26/2023)  Social Connections: Socially Isolated (06/26/2023)  Stress: No Stress Concern Present (06/26/2023)  Tobacco Use: Medium Risk (07/05/2023)  Health Literacy: Adequate Health Literacy (06/26/2023)    Readmission Risk Interventions     No data to display

## 2023-07-13 NOTE — Plan of Care (Signed)
  Problem: Skin Integrity: Goal: Risk for impaired skin integrity will decrease Outcome: Progressing   Problem: Clinical Measurements: Goal: Respiratory complications will improve Outcome: Progressing Goal: Cardiovascular complication will be avoided Outcome: Progressing   Problem: Activity: Goal: Risk for activity intolerance will decrease Outcome: Progressing   Problem: Coping: Goal: Level of anxiety will decrease Outcome: Progressing   Problem: Elimination: Goal: Will not experience complications related to bowel motility Outcome: Progressing Goal: Will not experience complications related to urinary retention Outcome: Progressing   Problem: Skin Integrity: Goal: Risk for impaired skin integrity will decrease Outcome: Progressing

## 2023-07-13 NOTE — Progress Notes (Addendum)
Physical Therapy Treatment Patient Details Name: Michelle [name] MRN: [id number] DOB: October 28, 1932 Today's Date: 07/13/2023   History of Present Illness 89 yo female presenting with AMS and seizures on 07/05/23. CT head showing Small volume subarachnoid hemorrhage in the right frontal lobe. Seizure meds and BP meds being adjusted.  PMH including dementia, hypertension, hyperlipidemia, diabetes, COPD, CKD 3, CAD status post stent, epilepsy, diastolic CHF, anxiety, anemia.    PT Comments  Pt resting in bed on arrival, awake and alert and agreeable to session with continued progress towards acute goals. Pt able to complete bed mobility with min A, transfers to stand initially with mod A +2 fading to mod A +1 with task practice. Pt able to progress gait for short distance in room mod A to maintain balance and RW for support, chair follow provided for safety as pt fatigues quickly. Pt continues to be limited by fatigue, global weakness and decreased activity tolerance and will benefit from intensive inpatient follow up therapy, >3 hours/day to address deficits to allow for safe discharge home. Pt continues to benefit from skilled PT services to progress toward functional mobility goals.     If plan is discharge home, recommend the following: Supervision due to cognitive status;Assist for transportation;Assistance with cooking/housework;Help with stairs or ramp for entrance;Direct supervision/assist for medications management;Direct supervision/assist for financial management;Two people to help with walking and/or transfers   Can travel by private vehicle     No  Equipment Recommendations  None recommended by PT    Recommendations for Other Services       Precautions / Restrictions Precautions Precautions: Fall Restrictions Weight Bearing Restrictions Per Provider Order: No     Mobility  Bed Mobility Overal bed mobility: Needs Assistance Bed Mobility: Supine to Sit     Supine to sit: Min  assist     General bed mobility comments: able to get to EOB with min assist, increased time, and verbal cues    Transfers Overall transfer level: Needs assistance Equipment used: Rolling walker (2 wheels) Transfers: Sit to/from Stand, Bed to chair/wheelchair/BSC Sit to Stand: Mod assist   Step pivot transfers: Mod assist       General transfer comment: +2 assist to stand from EOB but able to stand from recliner with mod assist of 1, cues for hand placement    Ambulation/Gait Ambulation/Gait assistance: Mod assist (chair follow for safety) Gait Distance (Feet): 4 Feet Assistive device: Rolling walker (2 wheels) Gait Pattern/deviations: Step-to pattern, Decreased stride length, Antalgic, Trunk flexed Gait velocity: slow     General Gait Details: very flexed posture with pt resting forarms on RW for support, slow and effortful steps   Stairs             Wheelchair Mobility     Tilt Bed    Modified Rankin (Stroke Patients Only)       Balance Overall balance assessment: Needs assistance Sitting-balance support: No upper extremity supported, Feet supported Sitting balance-Leahy Scale: Poor Sitting balance - Comments: supervision for sitting balance   Standing balance support: Bilateral upper extremity supported Standing balance-Leahy Scale: Poor Standing balance comment: Needs RW                            Cognition Arousal: Alert Behavior During Therapy: WFL for tasks assessed/performed Overall Cognitive Status: Impaired/Different from baseline Area of Impairment: Memory, Following commands, Safety/judgement, Awareness, Problem solving, Orientation, Attention  Orientation Level: Time, Situation (believed she went home yesterday and came back) Current Attention Level: Sustained Memory: Decreased short-term memory Following Commands: Follows one step commands consistently, Follows multi-step commands inconsistently,  Follows one step commands with increased time Safety/Judgement: Decreased awareness of safety, Decreased awareness of deficits Awareness: Emergent Problem Solving: Slow processing, Requires tactile cues, Requires verbal cues, Decreased initiation General Comments: alert and able to follow directions with increased time        Exercises General Exercises - Lower Extremity Long Arc Quad: AROM, Both, 10 reps, Seated Hip Flexion/Marching: AROM, Both, 10 reps, Seated    General Comments General comments (skin integrity, edema, etc.): Son arriving at end of session      Pertinent Vitals/Pain Pain Assessment Pain Assessment: Faces Faces Pain Scale: Hurts little more Pain Location: knees with standing Pain Descriptors / Indicators: Aching, Sore, Grimacing Pain Intervention(s): Monitored during session, Limited activity within patient's tolerance, Repositioned    Home Living                          Prior Function            PT Goals (current goals can now be found in the care plan section) Acute Rehab PT Goals PT Goal Formulation: With patient/family Time For Goal Achievement: 07/21/23 Progress towards PT goals: Progressing toward goals    Frequency    Min 1X/week      PT Plan      Co-evaluation PT/OT/SLP Co-Evaluation/Treatment: Yes Reason for Co-Treatment: For patient/therapist safety;To address functional/ADL transfers PT goals addressed during session: Mobility/safety with mobility;Proper use of DME;Strengthening/ROM OT goals addressed during session: ADL's and self-care      AM-PAC PT "6 Clicks" Mobility   Outcome Measure  Help needed turning from your back to your side while in a flat bed without using bedrails?: A Lot Help needed moving from lying on your back to sitting on the side of a flat bed without using bedrails?: A Lot Help needed moving to and from a bed to a chair (including a wheelchair)?: A Lot Help needed standing up from a chair  using your arms (e.g., wheelchair or bedside chair)?: A Lot Help needed to walk in hospital room?: A Lot Help needed climbing 3-5 steps with a railing? : Total 6 Click Score: 11    End of Session Equipment Utilized During Treatment: Gait belt Activity Tolerance: Patient tolerated treatment well Patient left: in chair;with call bell/phone within reach;with chair alarm set;with family/visitor present Nurse Communication: Mobility status PT Visit Diagnosis: Other abnormalities of gait and mobility (R26.89);Muscle weakness (generalized) (M62.81);Other symptoms and signs involving the nervous system (R29.898)     Time: 1610-9604 PT Time Calculation (min) (ACUTE ONLY): 27 min  Charges:    $Gait Training: 8-22 mins PT General Charges $$ ACUTE PT VISIT: 1 Visit                     Anna R. PTA Acute Rehabilitation Services Office: [phone]   Catalina Antigua 07/13/2023, 3:05 PM  Discussed with Alma Friendly, PTA and agree with update to discharge plan.    Jerolyn Center, PT Acute Rehabilitation Services  Office [phone]

## 2023-07-13 NOTE — Consult Note (Signed)
Palliative Medicine Inpatient Consult Note  Consulting Provider: Debarah Crape, DO   Reason for consult:   Palliative Care Consult Services Palliative Medicine Consult  Reason for Consult? GOC - fam considering Hospice   07/13/2023  HPI:  Per intake H&P --> Michelle [name] is 89 y.o. female with dementia, wheelchair-bound at baseline, hypertension, hyperlipidemia, diabetes, seizure disorder, COPD, CKD stage III, CAD status post stenting, epilepsy, diastolic CHF, anxiety, anemia, who presented with altered mental status and seizure, found by family.   Palliative care has been asked to get involved for further goals of care conversations.   Clinical Assessment/Goals of Care:  *Please note that this is a verbal dictation therefore any spelling or grammatical errors are due to the "Dragon Medical One" system interpretation.  I have reviewed medical records including EPIC notes, labs and imaging, received report from bedside RN, assessed the patient who is sitting in the recliner chair eating her lunch.    I met with Michelle [name] and her sons, Michelle [name] and Michelle [name] to further discuss diagnosis prognosis, GOC, EOL wishes, disposition and options.   I introduced Palliative Medicine as specialized medical care for people living with serious illness. It focuses on providing relief from the symptoms and stress of a serious illness. The goal is to improve quality of life for both the patient and the family.  Medical History Review and Understanding:  A review of patients past medical history inclusive of dementia, seizures, HLD, diabetes, COPD, CKD, CAD, and CHF was held.   Social History:  [name] originally from [place]. 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  Michelle [name] and Michelle [name] are her only children. She also has 2 granddaughters as well as 4 great-grandchildren. She is a woman of faith, practicing within the [employer]  denomination.   Functional and Nutritional State:  Preceding this hospitalization Season was "getting back on her feet" per her sons. She was able to pivot to the wheelchair in the home. She did rely on her son, Michelle [name] who is her primary caregiver to assist her with bADLs.   Advance Directives:  A detailed discussion was had today regarding advanced directives.  Yes, they are on file in [place] and name Michelle [name] as her first Management consultant and Michelle [name] as her second.   Code Status:  Concepts specific to code status, artifical feeding and hydration, continued IV antibiotics and rehospitalization was had.  The difference between a aggressive medical intervention path  and a palliative comfort care path for this patient at this time was had.   I reviewed [name] MOST form today as below:  Cardiopulmonary Resuscitation: Do Not Attempt Resuscitation (DNR/No CPR)  Medical Interventions: Limited Additional Interventions: Use medical treatment, IV fluids and cardiac monitoring as indicated, DO NOT USE intubation or mechanical ventilation. May consider use of less invasive airway support such as BiPAP or CPAP. Also provide comfort measures. Transfer to the hospital if indicated. Avoid intensive care.   Antibiotics: Antibiotics if indicated  IV Fluids: IV fluids if indicated  Feeding Tube: No feeding tube   Discussion:  We discussed patients present health. Per patients son(s) she has had three hospitalizations since October of this year. She was admitted in the setting of seizures. [name] has had a precipitous decline per her sons which is further exacerbated on each hospitalization. They share that they realize [name] frail state and poor long term prognosis.   Per sons she has been followed as an out  patient by Palliative care services though [facility] though they have found there to be an abundance of concerns related to the Palliative nurses availability. They share with me the desire to  transition hospice companies and have [name] evaluated for home hospice after rehabilitation.   The [facility] team has been made aware of the above. They are trying to determine if patient can get acute rehabilitation or not. She is not eligible for SNF at this time due to not being out of the hospital for 60 days.   Discussed the importance of continued conversation with family and their  medical providers regarding overall plan of care and treatment options, ensuring decisions are within the context of the patients values and GOCs.  Decision Maker: Michelle [name], Michelle [name] (Son): [phone] (Home Phone)   SUMMARY OF RECOMMENDATIONS   DNAR/DNI  Patients son(s) met with --> they are aware of patients frail state and high risk for decline  Appreciate TOC helping with placement --> being evaluated by acute rehabilitation  [facility] services --> Family would like to withdraw from [facility]iative services  Ongoing PMT support  Code Status/Advance Care Planning: DNAR/DNI  Palliative Prophylaxis:  Aspiration, Bowel Regimen, Delirium Protocol, Frequent Pain Assessment, Oral Care, Palliative Wound Care, and Turn Reposition  Additional Recommendations (Limitations, Scope, Preferences): Continue current care  Psycho-social/Spiritual:  Desire for further Chaplaincy support: Not presently Additional Recommendations: Education patients progressive decline   Prognosis: High 12 month mortality risk  Discharge Planning: Discharge plan to be determined.   Vitals:   07/13/23 0846 07/13/23 1039  BP:  (!) 115/42  Pulse:  64  Resp:  17  Temp:  98 F (36.7 C)  SpO2: 100% 100%    Intake/Output Summary (Last 24 hours) at 07/13/2023 1428 Last data filed at 07/13/2023 0981 Gross per 24 hour  Intake --  Output 900 ml  Net -900 ml   Last Weight  Most recent update: 07/05/2023  9:05 AM    Weight  75.8 kg (167 lb 1.7 oz)             Gen:  Elderly Caucasian F in NAD HEENT: moist mucous membranes CV: Regular rate and rhythm  PULM:  On RA, breathing is even and nonlabored ABD: soft/nontender  EXT: No edema  Neuro: Alert and oriented to self  PPS: 40%    This conversation/these recommendations were discussed with patient primary care team, Dr. Gerri Lins  Billing based on MDM: High  Problems Addressed: One acute or chronic illness or injury that poses a threat to life or bodily function  Amount and/or Complexity of Data: Category 3:Discussion of management or test interpretation with external physician/other qualified health care professional/appropriate source (not separately reported)  Risks: Decision regarding hospitalization or escalation of hospital care and Decision not to resuscitate or to de-escalate care because of poor prognosis ______________________________________________________ Lamarr Lulas [place] Palliative Medicine Team Team Cell Phone: [phone] Please utilize secure chat with additional questions, if there is no response within 30 minutes please call the above phone number  Palliative Medicine Team providers are available by phone from 7am to 7pm daily and can be reached through the team cell phone.  Should this patient require assistance outside of these hours, please call the patient's attending physician.

## 2023-07-13 NOTE — Progress Notes (Signed)
Inpatient Rehab Admissions Coordinator:   Per [facility] request pt was screened for CIR by Estill Dooms, PT, DPT.  Pt appears to be a potential candidate.  Therapy recommending HH at this time, and TOC and I have contacted them to see whether they think she could tolerate CIR.  If so, I will place a rehab consult order per protocol.  We would not be able to get [facility] approval without therapy recommendations to support CIR.    Estill Dooms, PT, DPT Admissions Coordinator [phone] 07/13/23  2:45 PM

## 2023-07-14 DIAGNOSIS — G40301 Generalized idiopathic epilepsy and epileptic syndromes, not intractable, with status epilepticus: Secondary | ICD-10-CM

## 2023-07-14 DIAGNOSIS — B9689 Other specified bacterial agents as the cause of diseases classified elsewhere: Secondary | ICD-10-CM | POA: Diagnosis not present

## 2023-07-14 DIAGNOSIS — N390 Urinary tract infection, site not specified: Secondary | ICD-10-CM | POA: Diagnosis not present

## 2023-07-14 DIAGNOSIS — I609 Nontraumatic subarachnoid hemorrhage, unspecified: Secondary | ICD-10-CM | POA: Diagnosis not present

## 2023-07-14 DIAGNOSIS — R569 Unspecified convulsions: Secondary | ICD-10-CM | POA: Diagnosis not present

## 2023-07-14 LAB — CBC WITH DIFFERENTIAL/PLATELET
Abs Immature Granulocytes: 0.02 10*3/uL (ref 0.00–0.07)
Basophils Absolute: 0.1 10*3/uL (ref 0.0–0.1)
Basophils Relative: 1 %
Eosinophils Absolute: 0.1 10*3/uL (ref 0.0–0.5)
Eosinophils Relative: 1 %
HCT: 40.1 % (ref 36.0–46.0)
Hemoglobin: 12.6 g/dL (ref 12.0–15.0)
Immature Granulocytes: 0 %
Lymphocytes Relative: 30 %
Lymphs Abs: 2.1 10*3/uL (ref 0.7–4.0)
MCH: 26.9 pg (ref 26.0–34.0)
MCHC: 31.4 g/dL (ref 30.0–36.0)
MCV: 85.5 fL (ref 80.0–100.0)
Monocytes Absolute: 0.7 10*3/uL (ref 0.1–1.0)
Monocytes Relative: 10 %
Neutro Abs: 4.2 10*3/uL (ref 1.7–7.7)
Neutrophils Relative %: 58 %
Platelets: 240 10*3/uL (ref 150–400)
RBC: 4.69 MIL/uL (ref 3.87–5.11)
RDW: 14.9 % (ref 11.5–15.5)
WBC: 7.2 10*3/uL (ref 4.0–10.5)
nRBC: 0 % (ref 0.0–0.2)

## 2023-07-14 LAB — BASIC METABOLIC PANEL
Anion gap: 9 (ref 5–15)
BUN: 36 mg/dL — ABNORMAL HIGH (ref 8–23)
CO2: 25 mmol/L (ref 22–32)
Calcium: 9.7 mg/dL (ref 8.9–10.3)
Chloride: 103 mmol/L (ref 98–111)
Creatinine, Ser: 0.98 mg/dL (ref 0.44–1.00)
GFR, Estimated: 56 mL/min — ABNORMAL LOW (ref >60)
Glucose, Bld: 108 mg/dL — ABNORMAL HIGH (ref 70–99)
Potassium: 4.3 mmol/L (ref 3.5–5.1)
Sodium: 137 mmol/L (ref 135–145)

## 2023-07-14 LAB — GLUCOSE, CAPILLARY
Glucose-Capillary: 107 mg/dL — ABNORMAL HIGH (ref 70–99)
Glucose-Capillary: 123 mg/dL — ABNORMAL HIGH (ref 70–99)
Glucose-Capillary: 129 mg/dL — ABNORMAL HIGH (ref 70–99)
Glucose-Capillary: 132 mg/dL — ABNORMAL HIGH (ref 70–99)
Glucose-Capillary: 132 mg/dL — ABNORMAL HIGH (ref 70–99)
Glucose-Capillary: 138 mg/dL — ABNORMAL HIGH (ref 70–99)
Glucose-Capillary: 176 mg/dL — ABNORMAL HIGH (ref 70–99)

## 2023-07-14 MED ORDER — HYDRALAZINE HCL 20 MG/ML IJ SOLN
10.0000 mg | Freq: Four times a day (QID) | INTRAMUSCULAR | Status: DC | PRN
  Administered 2023-07-14 – 2023-07-17 (×2): 10 mg via INTRAVENOUS
  Filled 2023-07-14 (×3): qty 1

## 2023-07-14 NOTE — PMR Pre-admission (Shared)
PMR Admission Coordinator Pre-Admission Assessment  Patient: Michelle [name] is an 89 y.o., female MRN: [id number] DOB: [birth year] Height: 5\' 5"  (165.1 cm) Weight: 75.8 kg              Insurance Information HMO: Yes    PPO:       PCP:       IPA:       80/20:       OTHER: Group [id number] PRIMARY: [employer] HMO      Policy#: [id number]      Subscriber: patient CM Name: ***      Phone#: ***     Fax#: [phone] Pre-Cert#: ***      Employer: Retired Benefits:  Phone #: [phone]     Name: Michelle [name] at [email or web address] Eff. Date: 07/18/22 to 07/18/23     Deduct: $0      Out of Pocket Max: [phone] (met [phone])      Life Max: n/a  CIR: $325 per day for 6 days with max $1950/admission      SNF: $0 for days 1-20; $203 for days 21-100 Outpatient:       Co-Pay: $25 copay per visit Home Health: 100%      Co-Pay: none DME: 80%     Co-Pay: 20% Providers: in network  SECONDARY:       Policy#:       Phone#:   Artist:       Phone#:   The Data processing manager" for patients in Inpatient Rehabilitation Facilities with attached "Privacy Act Statement-Health Care Records" was provided and verbally reviewed with: Patient and Family  Emergency Contact Information Contact Information     Name Relation Home Work Mobile   Michelle [name] Son [phone]  [phone]   Michelle [name], Michelle [name] [phone]  [phone]      Other Contacts   None Michelle File    Current Medical History  Patient Admitting Diagnosis: R SAH, seizures  History of Present Illness: An 89 y.o. 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 . They were admitted to [facility] Michelle 07/05/2023 for seizure.  Of note, multiple seizures with hospitalization in October, November, and early December.  This admission, family found her in bed seizing, resolved spontaneously prior to coming to ER.  ED workup notable for positive urinalysis, chest x-ray with mild vascular congestion, CT head with right frontal subarachnoid hemorrhage.  Patient had another witnessed seizure in the ER, was loaded with Keppra and Ativan, started Michelle ceftriaxone and admitted to the hospital with neurology consultation.  EEG was negative for seizure, suspected etiology was breakthrough secondary to toxic metabolic encephalopathy.  Hospitalization was complicated by encephalopathy, hypertension, UTI, heart failure, COPD, and dementia.  Patient initially to discharge home with hospice Michelle 12-25, however family concerned regarding inability to handle current functional deficits.  PM&R was consulted to evaluate appropriateness for IPR admission.    Michelle evaluation, patient is assisted by both sons in history taking.  She lives in a single-story home with level entrance with her son Michelle [name], who is available 24/7 and provides physical assistance for most  ADL and mobility with a wheelchair.  She was able to stand pivot transfer from the bed to the bedside commode independently prior to admission.  Wears adult diaper for incontinence, which she can sometimes self manage and sometimes needs assistance with.  Per family, will not get SNF benefits unless out of skilled for at least 60 days, currently unable to manage patient at home with home health, and without assets to afford transition to nursing home.  Goals for inpatient rehab would be to transfer and ambulate minimal distances at CGA/min assist level.  Complete NIHSS TOTAL: 1 Glasgow Coma Scale Score: 14  Patient's medical record from  [facility] has been reviewed by the rehabilitation admission coordinator and physician.  Past Medical History  Past Medical History:  Diagnosis Date   Anginal pain (HCC)    Arthritis    AV block, 1st degree    CAD (coronary artery disease) 1990; 2015   Cardiac cath 1990 with Dr. Riley Kill and [name] Residual CAD, non obstructive per 2015 cath. STEMI status post stent in August 2015.   COPD (chronic obstructive pulmonary disease) (HCC)    Diabetes mellitus type 2, insulin dependent (HCC)    Essential hypertension    Hyperlipidemia with target LDL less than 70    Myocardial infarction [facility])    Pericarditis-post MI (short course of steroids) 03/06/2014   S/P coronary artery stent placement 02/18/14, DES -RCA to cover RCA aneurysm 02/18/14   Promus DES to RCA with STEMI   Shortness of breath     Has the patient had major surgery during 100 days prior to admission? No  Family History  family history includes Bone cancer in her brother; Cancer in her maternal grandfather, paternal grandfather, and paternal grandmother; Cancer (age of onset: 33) in her father; Diabetes in her son and son; Heart attack in her son; Heart attack (age of onset: 84) in her mother; Hyperlipidemia in her son; Hypertension in her son; Liver cancer in her brother.   Current Medications   Current Facility-Administered Medications:    acetaminophen (TYLENOL) tablet 650 mg, 650 mg, Oral, Q6H PRN, 650 mg at 07/14/23 1141 **OR** acetaminophen (TYLENOL) suppository 650 mg, 650 mg, Rectal, Q6H PRN, Michelle Fail, MD   albuterol (PROVENTIL) (2.5 MG/3ML) 0.083% nebulizer solution 2.5 mg, 2.5 mg, Inhalation, Q4H PRN, Michelle Fail, MD   alum & mag hydroxide-simeth (MAALOX/MYLANTA) 200-200-20 MG/5ML suspension 30 mL, 30 mL, Oral, Q4H PRN, Michelle [name], Alexandra, Michelle [name],  30 mL at 07/12/23 2034   amLODipine (NORVASC) tablet 2.5 mg, 2.5 mg, Oral, Daily, Michelle [name], Ramesh, MD, 2.5 mg at 07/14/23 4401   carvedilol (COREG) tablet 12.5 mg, 12.5 mg, Oral, BID, Michelle Fail, MD, 12.5 mg at 07/14/23 0272   enalapril (VASOTEC) tablet 20 mg, 20 mg, Oral, QHS, Michelle Fail, MD, 20 mg at 07/13/23 2214   fenofibrate tablet 54 mg, 54 mg, Oral, Daily, Michelle [name], Ramesh, MD, 54 mg at 07/14/23 0936   hydrALAZINE (APRESOLINE) injection 10 mg, 10 mg, Intravenous, Q6H PRN, Mansy, Jan A, MD, 10 mg at 07/14/23 5366   hydrALAZINE (APRESOLINE) tablet 10 mg, 10 mg, Oral, TID, Michelle [name], Ramesh, MD, 10 mg at 07/14/23 4403   icosapent Ethyl (VASCEPA) 1 g capsule 1 g, 1 g, Oral, BID WC, 1 g at 07/14/23 1232 **AND** icosapent Ethyl (VASCEPA) 1 g capsule  2 g, 2 g, Oral, QHS, Doristine Counter, RPH, 2 g at 07/13/23 2215   insulin aspart (novoLOG) injection 0-15 Units, 0-15 Units, Subcutaneous, Q4H, Michelle Fail, MD, 2 Units at 07/14/23 1118   labetalol (NORMODYNE) injection 5 mg, 5 mg, Intravenous, Q2H PRN, Michelle Fail, MD, 5 mg at 07/14/23 0654   lamoTRIgine (LAMICTAL) tablet 50 mg, 50 mg, Oral, BID, Michelle Fail, MD, 50 mg at 07/14/23 9147   levETIRAcetam (KEPPRA) tablet 500 mg, 500 mg, Oral, Q12H, Hetty Blend C, NP, 500 mg at 07/14/23 8295   loperamide (IMODIUM) capsule 2 mg, 2 mg, Oral, BID PRN, Michelle Fail, MD   LORazepam (ATIVAN) injection 2 mg, 2 mg, Intravenous, Q5 Min x 2 PRN, Michelle Fail, MD   medroxyPROGESTERone (PROVERA) tablet 10 mg, 10 mg, Oral, Daily, Michelle Fail, MD, 10 mg at 07/14/23 6213   melatonin tablet 3 mg, 3 mg, Oral, QHS PRN, Lanae Boast, MD   mometasone-formoterol (DULERA) 200-5 MCG/ACT inhaler 2 puff, 2 puff, Inhalation, BID, Michelle Fail, MD, 2 puff at 07/14/23 0818   nortriptyline (PAMELOR) capsule 10 mg, 10 mg, Oral, QHS, Michelle [name], Ramesh, MD, 10 mg at 07/13/23 2213   pantoprazole (PROTONIX) EC tablet 40 mg, 40 mg, Oral, Daily,  Michelle Fail, MD, 40 mg at 07/14/23 0936   polyethylene glycol (MIRALAX / GLYCOLAX) packet 17 g, 17 g, Oral, Daily PRN, Michelle Fail, MD   QUEtiapine (SEROQUEL) tablet 12.5 mg, 12.5 mg, Oral, QPM, Michelle [name], Alexandra, Michelle [name], 12.5 mg at 07/13/23 1728   rosuvastatin (CRESTOR) tablet 40 mg, 40 mg, Oral, Daily, Michelle Fail, MD, 40 mg at 07/14/23 0865   sertraline (ZOLOFT) tablet 100 mg, 100 mg, Oral, QHS, Michelle [name], Ramesh, MD, 100 mg at 07/13/23 2218   sodium chloride flush (NS) 0.9 % injection 3 mL, 3 mL, Intravenous, Q12H, Michelle Fail, MD, 3 mL at 07/14/23 7846   umeclidinium bromide (INCRUSE ELLIPTA) 62.5 MCG/ACT 1 puff, 1 puff, Inhalation, Daily, Michelle Fail, MD, 1 puff at 07/13/23 0846  Patients Current Diet:  Diet Order             DIET DYS 3 Room service appropriate? Yes with Assist; Fluid consistency: Thin  Diet effective now                   Precautions / Restrictions Precautions Precautions: Fall Precaution Comments: LTM EEG Restrictions Weight Bearing Restrictions Per Provider Order: No   Has the patient had 2 or more falls or a fall with injury in the past year?Yes  Prior Activity Level Household: Went out for MD visits.  Prior Functional Level Prior Function Prior Level of Function : Needs assist Physical Assist : Mobility (physical), ADLs (physical) Mobility Comments: Using w/c mainly since admission in November, PT stated coming to home recently and started working Michelle mobility with RW short distance. ADLs Comments: needs min assist for bathing and bathing from family; depends Michelle how fatigued she is. Supervision-Min A for toileting in bathroom. Using BSC at night by bed.  Self Care: Did the patient need help bathing, dressing, using the toilet or eating?  Needed some help  Indoor Mobility: Did the patient need assistance with walking from room to room (with or without device)? Needed some help  Stairs: Did the patient need assistance  with internal or external stairs (with or without device)? Needed some help  Functional Cognition: Did the patient need help planning regular tasks such as shopping  or remembering to take medications? Needed some help  Patient Information Are you of Hispanic, Latino/a,or Spanish origin?: A. No, not of Hispanic, Latino/a, or Spanish origin What is your race?: A. White Michelle [name] you need or want an interpreter to communicate with a doctor or health care staff?: 0. No  Patient's Response To:  Health Literacy and Transportation Is the patient able to respond to health literacy and transportation needs?: Yes Health Literacy - How often Michelle [name] you need to have someone help you when you read instructions, pamphlets, or other written material from your doctor or pharmacy?: Always In the past 12 months, has lack of transportation kept you from medical appointments or from getting medications?: No In the past 12 months, has lack of transportation kept you from meetings, work, or from getting things needed for daily living?: No  Journalist, newspaper / Equipment Home Equipment: Rollator (4 wheels), Agricultural consultant (2 wheels), BSC/3in1, Shower seat, Wheelchair - manual, Tub bench, Grab bars - toilet, Toilet riser  Prior Device Use: Indicate devices/aids used by the patient prior to current illness, exacerbation or injury? Manual wheelchair  Current Functional Level Cognition  Overall Cognitive Status: Impaired/Different from baseline Difficult to assess due to: Level of arousal Current Attention Level: Sustained Orientation Level: Oriented to person, Oriented to place, Oriented to time, Disoriented to situation Following Commands: Follows one step commands consistently, Follows multi-step commands inconsistently, Follows one step commands with increased time Safety/Judgement: Decreased awareness of safety, Decreased awareness of deficits General Comments: alert and able to follow directions with increased  time    Extremity Assessment (includes Sensation/Coordination)  Upper Extremity Assessment: Generalized weakness  Lower Extremity Assessment: Defer to PT evaluation RLE Deficits / Details: knees with obvious arthritic changes, son reports has injections usually about every 3 months and just had them done recently; [facility], strength hip flexion 3-/5, knee extension 3+/5 RLE Sensation: WNL LLE Deficits / Details: knees with obvious arthritic changes, son reports has injections usually about every 3 months and just had them done recently; [facility] WFL, strength hip flexion 3-/5, knee extension 3+/5 LLE Sensation: WNL    ADLs  Overall ADL's : Needs assistance/impaired Eating/Feeding: Set up, Supervision/ safety, Bed level Grooming: Wash/dry hands, Wash/dry face, Oral care, Brushing hair, Minimal assistance, Sitting Grooming Details (indicate cue type and reason): assistance to complete Upper Body Bathing: Moderate assistance, Sitting Lower Body Bathing: Maximal assistance, Sit to/from stand Upper Body Dressing : Maximal assistance, Sitting Lower Body Dressing: Maximal assistance Toilet Transfer: Moderate assistance, +2 for physical assistance Toilet Transfer Details (indicate cue type and reason): simulated to chair Functional mobility during ADLs: Moderate assistance, +2 for physical assistance, Rolling walker (2 wheels) General ADL Comments: unable to stand for grooming trasks    Mobility  Overal bed mobility: Needs Assistance Bed Mobility: Supine to Sit Rolling: Max assist, +2 for physical assistance Supine to sit: Min assist Sit to supine: Max assist, +2 for physical assistance, Used rails General bed mobility comments: able to get to EOB with min assist, increased time, and verbal cues    Transfers  Overall transfer level: Needs assistance Equipment used: Rolling walker (2 wheels) Transfers: Sit to/from Stand, Bed to chair/wheelchair/BSC Sit to Stand: Mod assist Bed to/from  chair/wheelchair/BSC transfer type:: Step pivot Stand pivot transfers: Mod assist Step pivot transfers: Mod assist General transfer comment: mod A to stand from EOB x2 and recliner x2, asssit needed to boost from sitting surface, mod A to steady for taking steps around to chair  Ambulation / Gait / Stairs / Wheelchair Mobility  Ambulation/Gait Ambulation/Gait assistance: Mod assist (chair follow for safety) Gait Distance (Feet): 4 Feet Assistive device: Rolling walker (2 wheels) Gait Pattern/deviations: Step-to pattern, Decreased stride length, Antalgic, Trunk flexed General Gait Details: deferred Gait velocity: slow    Posture / Balance Dynamic Sitting Balance Sitting balance - Comments: supervision for sitting balance Balance Overall balance assessment: Needs assistance Sitting-balance support: No upper extremity supported, Feet supported Sitting balance-Leahy Scale: Poor Sitting balance - Comments: supervision for sitting balance Postural control: Right lateral lean Standing balance support: Bilateral upper extremity supported Standing balance-Leahy Scale: Poor Standing balance comment: Needs RW    Special needs/care consideration Diabetic management Has h/o DM and Special service needs ***     Previous Home Environment (from acute therapy documentation) Living Arrangements: Children (Son, Michelle [name]) Available Help at Discharge: Family, Available 24 hours/day Type of Home: House Home Layout: One level Home Access: Ramped entrance Bathroom Shower/Tub: Health visitor: Handicapped height How Accessible: Accessible via walker Home Care Services: No  Discharge Living Setting Plans for Discharge Living Setting: Patient's home, House, Lives with (comment) (Lives with son.) Type of Home at Discharge: House Discharge Home Layout: One level Discharge Home Access: Ramped entrance Discharge Bathroom Shower/Tub: Walk-in shower, Curtain Discharge Bathroom Toilet:  Handicapped height Discharge Bathroom Accessibility: Yes How Accessible: Accessible via walker Does the patient have any problems obtaining your medications?: No  Social/Family/Support Systems Patient Roles: Parent (Has 2 sons.) Contact Information: Jakela Lizarraga - son Anticipated Caregiver: sons Anticipated Caregiver's Contact Information: Michelle [name] - son - [phone] Ability/Limitations of Caregiver: Son Michelle [name] is retired and lives with patient.  Other son [name] lives in [place] and helps out daily. Caregiver Availability: 24/7 Discharge Plan Discussed with Primary Caregiver: Yes Is Caregiver In Agreement with Plan?: Yes Does Caregiver/Family have Issues with Lodging/Transportation while Pt is in Rehab?: No   Goals Patient/Family Goal for Rehab: PT/OT/SLP min assist goals Expected length of stay: 12-14 days Pt/Family Agrees to Admission and willing to participate: Yes Program Orientation Provided & Reviewed with Pt/Caregiver Including Roles  & Responsibilities: Yes   Decrease burden of Care through IP rehab admission: N/A   Possible need for SNF placement upon discharge: Not planned   Patient Condition: {PATIENT'S CONDITION:22832}  Preadmission Screen Completed By:  Trish Mage, RN, 07/14/2023 2:55 PM ______________________________________________________________________   Discussed status with Dr. Marland Kitchenon***at *** and received approval for admission today.  Admission Coordinator:  Trish Mage, time***/Date***

## 2023-07-14 NOTE — Consult Note (Signed)
Physical Medicine and Rehabilitation Consult Reason for Consult: Evaluate appropriateness for Inpatient Rehab Referring Physician: Dr. Gerri Lins    HPI: Michelle [name] is a 89 y.o. 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 . They were admitted to [facility] on 07/05/2023 for seizure.  Of note, multiple seizures with hospitalization in October, November, and early December.  This admission, family found her in bed seizing, resolved spontaneously prior to coming to ER.  ED workup notable for positive urinalysis, chest x-ray with mild vascular congestion, CT head with right frontal subarachnoid hemorrhage.  Patient had another witnessed seizure in the ER, was loaded with Keppra and Ativan, started on ceftriaxone and admitted to the hospital with neurology consultation.  EEG was negative for seizure, suspected etiology was breakthrough secondary to toxic metabolic encephalopathy.  Hospitalization was complicated by encephalopathy, hypertension, UTI, heart failure, COPD, and dementia.  Patient initially to discharge home with hospice on 12-25, however family concerned regarding inability to handle current functional deficits.  PM&R was consulted to evaluate appropriateness for IPR admission.   On evaluation, patient is assisted by both sons in history taking.  She lives in a single-story home with level entrance with her son, who is available 24/7 and provides physical assistance for most ADL and mobility with a wheelchair.   She was able to stand pivot transfer from the bed to the bedside commode independently prior to admission.  Wears adult diaper for incontinence, which she can sometimes self manage and sometimes needs assistance with.  Per family, will not get SNF benefits unless out of skilled for at least 60 days, currently unable to manage patient at home with home health, and without assets to afford transition to nursing home.  Goals for inpatient rehab would be to transfer at CGA/min assist level with son providing care support.  Unable to obtain ROS due to patient cognitive status.    Past Medical History:  Diagnosis Date   Anginal pain (HCC)    Arthritis    AV block, 1st degree    CAD (coronary artery disease) 1990; 2015   Cardiac cath 1990 with Dr. Riley Kill and [name] Residual CAD, non obstructive per 2015 cath. STEMI status post stent in August 2015.   COPD (chronic obstructive pulmonary disease) (HCC)    Diabetes mellitus type 2, insulin dependent (HCC)    Essential hypertension    Hyperlipidemia with target LDL less than 70    Myocardial infarction [facility])    Pericarditis-post MI (short course of steroids) 03/06/2014   S/P coronary artery stent placement 02/18/14, DES -RCA to cover RCA aneurysm 02/18/14   Promus DES to RCA with STEMI   Shortness of breath    Past Surgical History:  Procedure Laterality Date   CATARACT EXTRACTION  2020   right and left eye    CHOLECYSTECTOMY     thinks her appendix was removed at the same time   CORONARY ANGIOPLASTY WITH  STENT PLACEMENT  02/18/14   Promus DES to RCA   LEFT HEART CATHETERIZATION WITH CORONARY ANGIOGRAM N/A 02/18/2014   Procedure: LEFT HEART CATHETERIZATION WITH CORONARY ANGIOGRAM;  Surgeon: Corky Crafts, MD;  Location: [facility] CATH LAB;  Service: Cardiovascular;  Laterality: N/A;   PTCA  1990   PTCA  of RCA   TRANSTHORACIC ECHOCARDIOGRAM  02/02/2012   mild LVH, EF 55-60%, Normal WM, Gr 1 DD; Mild MR   Family History  Problem Relation Age of Onset   Heart attack Mother 28   Cancer Father 28   Cancer Maternal Grandfather    Cancer Paternal Grandmother    Cancer Paternal Grandfather    Diabetes Son    Liver cancer Brother    Bone cancer Brother    Heart attack Son    Hypertension Son    Diabetes Son    Hyperlipidemia Son    Colon cancer Neg Hx    Ovarian cancer Neg Hx    Uterine cancer Neg Hx    Social History:  reports that she quit smoking about 16 years ago. Her smoking use included cigarettes. She started smoking about 71 years ago. She has a 55 pack-year smoking history. She has never used smokeless tobacco. She reports that she does not drink alcohol and does not use drugs. Allergies:  Allergies  Allergen Reactions   Haldol [Haloperidol]    Codeine Sulfate Nausea Only   Morphine Sulfate Nausea Only   Medications Prior to Admission  Medication Sig Dispense Refill   acetaminophen (TYLENOL) 650 MG CR tablet Take 1,300 mg by mouth 3 (three) times daily.     albuterol (PROVENTIL) (2.5 MG/3ML) 0.083% nebulizer solution Take 2.5 mg by nebulization every 6 (six) hours as needed for wheezing or shortness of breath.     amLODipine (NORVASC) 2.5 MG tablet TAKE 1 TABLET EVERY MORNING. IF AFTERNOON BLOOD PRESSURE IS OVER 145/90 TAKE SECOND TABLET. 180 tablet 3   aspirin 81 MG chewable tablet Chew 81 mg by mouth daily.     carvedilol (COREG) 12.5 MG tablet Take 1 tablet (12.5 mg total) by mouth 2 (two) times daily. 180 tablet 3   cholecalciferol (VITAMIN D3) 25 MCG (1000 UNIT) tablet Take 1,000 Units by mouth daily with breakfast.     clopidogrel (PLAVIX) 75 MG tablet TAKE 1 TABLET EVERY DAY 90 tablet 3   enalapril (VASOTEC) 20 MG tablet Take 1 tablet (20 mg total) by mouth at bedtime. 90 tablet 3   fenofibrate micronized (LOFIBRA) 134 MG capsule TAKE 1 CAPSULE EVERY DAY 90 capsule 3    ferrous sulfate 325 (65 FE) MG tablet Twice a week (Patient taking differently: Take 325 mg by mouth 2 (two) times a week. Tuesday  and Friday) 18 tablet 3   fluticasone-salmeterol (WIXELA INHUB) 250-50 MCG/ACT AEPB Inhale 1 puff into the lungs in the morning and at bedtime.     furosemide (LASIX) 20 MG tablet Take 1 tablet (20 mg total) by mouth as needed for fluid or edema. 90 tablet 2   hydrALAZINE (APRESOLINE) 25 MG tablet Take 25 mg by mouth 2 (two) times daily as needed (Only use is blood pressure get over >160).     icosapent Ethyl (VASCEPA) 1 g capsule Take 2 capsules (2 g total) by mouth 2 (two) times daily. (Patient taking differently: Take 1-2 g by mouth See admin instructions. Take 1 gram in the morning and afternoon. Take 2 g at bedtime) 360 capsule 3   lamoTRIgine (LAMICTAL) 25 MG  tablet Take 2 tablets (50 mg total) by mouth 2 (two) times daily. 60 tablet 1   loperamide (IMODIUM A-D) 2 MG tablet Take 2-4 mg by mouth 2 (two) times daily as needed for diarrhea or loose stools.     loratadine (CLARITIN) 10 MG tablet Take 10 mg by mouth every other day.     medroxyPROGESTERone (PROVERA) 10 MG tablet Take 1 tablet (10 mg total) by mouth daily. (Patient taking differently: Take 10 mg by mouth at bedtime.) 30 tablet 3   nortriptyline (PAMELOR) 10 MG capsule Take 10 mg by mouth at bedtime.     pantoprazole (PROTONIX) 40 MG tablet TAKE 1 TABLET EVERY DAY 90 tablet 3   QUEtiapine (SEROQUEL) 25 MG tablet Take 1 tablet (25 mg total) by mouth every evening. Between 1830 and 1900 90 tablet 3   rosuvastatin (CRESTOR) 40 MG tablet Take 1 tablet (40 mg total) by mouth daily. 90 tablet 3   sertraline (ZOLOFT) 50 MG tablet TAKE 2 TABLETS AT BEDTIME (Patient taking differently: Take 100 mg by mouth at bedtime.) 180 tablet 3   SPIRIVA HANDIHALER 18 MCG inhalation capsule PLACE 1 CAPSULE INTO HANDIHALER AND INHALE THE CONTENTS DAILY 90 capsule 3   albuterol (VENTOLIN HFA) 108 (90 Base) MCG/ACT inhaler  INHALE 1 PUFF EVERY 4 HOURS AS NEEDED FOR WHEEZING, SHORTNESS OF BREATH (RESCUE INHALER IF ADVAIR NOT WORKING) (Patient not taking: Reported on 07/05/2023) 2 each 2   TRUEplus Lancets 33G MISC TEST BLOOD SUGAR UP TO FOUR TIMES DAILY AS DIRECTED 400 each 3    Home: Home Living Family/patient expects to be discharged to:: Private residence Living Arrangements: Children (Son, [name]) Available Help at Discharge: Family, Available 24 hours/day Type of Home: House Home Access: Ramped entrance Home Layout: One level Bathroom Shower/Tub: Health visitor: Handicapped height Home Equipment: Rollator (4 wheels), Agricultural consultant (2 wheels), BSC/3in1, Shower seat, Wheelchair - manual, Tub bench, Grab bars - toilet, Toilet riser  Functional History: Prior Function Prior Level of Function : Needs assist Physical Assist : Mobility (physical), ADLs (physical) Mobility Comments: Using w/c mainly since admission in November, PT stated coming to home recently and started working on mobility with RW short distance. ADLs Comments: needs min assist for bathing and bathing from family; depends on how fatigued she is. Supervision-Min A for toileting in bathroom. Using BSC at night by bed. Functional Status:  Mobility: Bed Mobility Overal bed mobility: Needs Assistance Bed Mobility: Supine to Sit Rolling: Max assist, +2 for physical assistance Supine to sit: Min assist Sit to supine: Max assist, +2 for physical assistance, Used rails General bed mobility comments: able to get to EOB with min assist, increased time, and verbal cues Transfers Overall transfer level: Needs assistance Equipment used: Rolling walker (2 wheels) Transfers: Sit to/from Stand, Bed to chair/wheelchair/BSC Sit to Stand: Mod assist Bed to/from chair/wheelchair/BSC transfer type:: Step pivot Stand pivot transfers: Mod assist Step pivot transfers: Mod assist General transfer comment: mod A to stand from EOB x2 and  recliner x2, asssit needed to boost from sitting surface, mod A to steady for taking steps around to chair Ambulation/Gait Ambulation/Gait assistance: Mod assist (chair follow for safety) Gait Distance (Feet): 4 Feet Assistive device: Rolling walker (2 wheels) Gait Pattern/deviations: Step-to pattern, Decreased stride length, Antalgic, Trunk flexed General Gait Details: deferred Gait velocity: slow    ADL: ADL Overall ADL's : Needs assistance/impaired Eating/Feeding: Set up, Supervision/ safety, Bed level Grooming: Wash/dry hands, Wash/dry face, Oral care, Brushing hair, Minimal  assistance, Sitting Grooming Details (indicate cue type and reason): assistance to complete Upper Body Bathing: Moderate assistance, Sitting Lower Body Bathing: Maximal assistance, Sit to/from stand Upper Body Dressing : Maximal assistance, Sitting Lower Body Dressing: Maximal assistance Toilet Transfer: Moderate assistance, +2 for physical assistance Toilet Transfer Details (indicate cue type and reason): simulated to chair Functional mobility during ADLs: Moderate assistance, +2 for physical assistance, Rolling walker (2 wheels) General ADL Comments: unable to stand for grooming trasks  Cognition: Cognition Overall Cognitive Status: Impaired/Different from baseline Orientation Level: Oriented to person, Oriented to place, Oriented to time, Disoriented to situation Cognition Arousal: Alert Behavior During Therapy: WFL for tasks assessed/performed Overall Cognitive Status: Impaired/Different from baseline Area of Impairment: Memory, Following commands, Safety/judgement, Awareness, Problem solving, Orientation, Attention Orientation Level: Time, Situation (believed she went home over weekend and came back) Current Attention Level: Sustained Memory: Decreased short-term memory Following Commands: Follows one step commands consistently, Follows multi-step commands inconsistently, Follows one step commands  with increased time Safety/Judgement: Decreased awareness of safety, Decreased awareness of deficits Awareness: Emergent Problem Solving: Slow processing, Requires tactile cues, Requires verbal cues, Decreased initiation General Comments: alert and able to follow directions with increased time Difficult to assess due to: Level of arousal  Blood pressure (!) 127/53, pulse 73, temperature 97.7 F (36.5 C), temperature source Oral, resp. rate 18, height 5\' 5"  (1.651 m), weight 75.8 kg, SpO2 100%. Physical Exam  PE: Constitution: Appropriate appearance for age. No apparent distress +Obese HEENT: Dentition poor, NCAT Resp: No respiratory distress. No accessory muscle usage. on RA and CTAB Cardio: Regular rate and rhythm.  Well perfused appearance. No peripheral edema. Abdomen: Nondistended, protuberant. Nontender.  Bowel sounds positive and normoactive. Psych: Flat affect.  Neuro: Oriented to self only, does not improve with cueing or options.  Lethargic on exam, intermittently falling asleep,easily distracted.  Follows 2/3 simple commands with repeated stimulation. Language: Fluent, mild dysarthria, no aphasia. Insight: Poor insight into current deficits DTRs: Reflexes were 2+ in bilateral achilles, patella, biceps, BR and triceps. Babinsky: flexor responses b/l.   Hoffmans: negative b/l Sensory exam: revealed normal sensation in all dermatomal regions in bilateral upper extremities and bilateral lower extremities Motor exam: Difficulty testing due to poor participation; antigravity against resistance equally in bilateral upper extremities and bilateral lower extremities Coordination: Fine motor coordination was reduced in bilateral upper extremity; no apparent ataxia    Results for orders placed or performed during the hospital encounter of 07/05/23 (from the past 24 hours)  Glucose, capillary     Status: Abnormal   Collection Time: 07/13/23  3:35 PM  Result Value Ref Range    Glucose-Capillary 141 (H) 70 - 99 mg/dL  Glucose, capillary     Status: Abnormal   Collection Time: 07/13/23  8:16 PM  Result Value Ref Range   Glucose-Capillary 149 (H) 70 - 99 mg/dL  Glucose, capillary     Status: Abnormal   Collection Time: 07/14/23 12:03 AM  Result Value Ref Range   Glucose-Capillary 123 (H) 70 - 99 mg/dL  Glucose, capillary     Status: Abnormal   Collection Time: 07/14/23  4:12 AM  Result Value Ref Range   Glucose-Capillary 132 (H) 70 - 99 mg/dL  CBC with Differential/Platelet     Status: None   Collection Time: 07/14/23  6:24 AM  Result Value Ref Range   WBC 7.2 4.0 - 10.5 K/uL   RBC 4.69 3.87 - 5.11 MIL/uL   Hemoglobin 12.6 12.0 - 15.0 g/dL  HCT 40.1 36.0 - 46.0 %   MCV 85.5 80.0 - 100.0 fL   MCH 26.9 26.0 - 34.0 pg   MCHC 31.4 30.0 - 36.0 g/dL   RDW 16.1 09.6 - 04.5 %   Platelets 240 150 - 400 K/uL   nRBC 0.0 0.0 - 0.2 %   Neutrophils Relative % 58 %   Neutro Abs 4.2 1.7 - 7.7 K/uL   Lymphocytes Relative 30 %   Lymphs Abs 2.1 0.7 - 4.0 K/uL   Monocytes Relative 10 %   Monocytes Absolute 0.7 0.1 - 1.0 K/uL   Eosinophils Relative 1 %   Eosinophils Absolute 0.1 0.0 - 0.5 K/uL   Basophils Relative 1 %   Basophils Absolute 0.1 0.0 - 0.1 K/uL   Immature Granulocytes 0 %   Abs Immature Granulocytes 0.02 0.00 - 0.07 K/uL  Basic metabolic panel     Status: Abnormal   Collection Time: 07/14/23  6:24 AM  Result Value Ref Range   Sodium 137 135 - 145 mmol/L   Potassium 4.3 3.5 - 5.1 mmol/L   Chloride 103 98 - 111 mmol/L   CO2 25 22 - 32 mmol/L   Glucose, Bld 108 (H) 70 - 99 mg/dL   BUN 36 (H) 8 - 23 mg/dL   Creatinine, Ser 4.09 0.44 - 1.00 mg/dL   Calcium 9.7 8.9 - 81.1 mg/dL   GFR, Estimated 56 (L) >60 mL/min   Anion gap 9 5 - 15  Glucose, capillary     Status: Abnormal   Collection Time: 07/14/23  7:49 AM  Result Value Ref Range   Glucose-Capillary 107 (H) 70 - 99 mg/dL  Glucose, capillary     Status: Abnormal   Collection Time: 07/14/23 11:10  AM  Result Value Ref Range   Glucose-Capillary 132 (H) 70 - 99 mg/dL   *Note: Due to a large number of results and/or encounters for the requested time period, some results have not been displayed. A complete set of results can be found in Results Review.   No results found.  Assessment/Plan: Diagnosis: Debility secondary to seizures, complicated by encephalopathy from UTI/postictal/medication Does the need for close, 24 hr/day medical supervision in concert with the patient's rehab needs make it unreasonable for this patient to be served in a less intensive setting? Yes Co-Morbidities requiring supervision/potential complications: Toxic metabolic encephalopathy, hypertension, UTI, heart failure, COPD, and dementia Due to bladder management, bowel management, safety, skin/wound care, disease management, medication administration, and patient education, does the patient require 24 hr/day rehab nursing? Yes Does the patient require coordinated care of a physician, rehab nurse, therapy disciplines of PT, OT, and SLP to address physical and functional deficits in the context of the above medical diagnosis(es)? Yes Addressing deficits in the following areas: balance, endurance, locomotion, strength, transferring, bowel/bladder control, bathing, dressing, feeding, grooming, toileting, cognition, swallowing, and psychosocial support Can the patient actively participate in an intensive therapy program of at least 3 hrs of therapy per day at least 5 days per week? Potentially The potential for patient to make measurable gains while on inpatient rehab is good Anticipated functional outcomes upon discharge from inpatient rehab are min assist  with PT, min assist with OT, min assist with SLP. Estimated rehab length of stay to reach the above functional goals is: 12-14 days Anticipated discharge destination: Home Overall Rehab/Functional Prognosis: good  POST ACUTE RECOMMENDATIONS: This patient's condition  is appropriate for continued rehabilitative care in the following setting: CIR Patient has agreed to participate  in recommended program. N/A, patient does not currently have decision-making capacity.  Family agreeable to CIR. Note that insurance prior authorization may be required for reimbursement for recommended care.  Comment: Ms. Jo presents with functional decline and debility secondary to uncontrolled seizures, suspected to have been exacerbated this hospitalization by underlying UTI.  Her seizures have remained well-controlled with negative EEG on Keppra and antibiotics, however family requires her to be at a contact-guard to min assist level for transfers from bed to commode or bed to wheelchair prior to discharge home with her son, who can provide 24/7 assistance.  She already has excellent support for ADLs and a good home set up which allows her to use wheelchair mobility in the home.  Given limited dispo options as above, believe inpatient rehab admission with goal of improved transfers and improvement in cognition, ADLs is appropriate at this time.   MEDICAL RECOMMENDATIONS: Defer medication titration for seizures to neurology, however cognitive effects of Keppra can be debilitating and is a possible barrier to 3 hours of therapy a day required for rehab tolerance.  Would consider transitioning to another medication or increasing dose of Lamictal and titrating off of Keppra as planned if safe.  Delirium precautions with promotion of sleep-wake cycle, daytime stimulation and reorientation, limitations of lines/tubes/drains, and limiting interruptions at nighttime   I have personally performed a face to face diagnostic evaluation of this patient. Additionally, I have examined the patient's medical record including any pertinent labs and radiographic images. If the physician assistant has documented in this note, I have reviewed and edited or otherwise concur with the physician assistant's  documentation.  Thanks,  Angelina Sheriff, DO 07/14/2023

## 2023-07-14 NOTE — Progress Notes (Signed)
Physical Therapy Treatment Patient Details Name: Michelle [name] MRN: [id number] DOB: Mar 18, 1933 Today's Date: 07/14/2023   History of Present Illness 89 yo female presenting with AMS and seizures on 07/05/23. CT head showing Small volume subarachnoid hemorrhage in the right frontal lobe. Seizure meds and BP meds being adjusted.  PMH including dementia, hypertension, hyperlipidemia, diabetes, COPD, CKD 3, CAD status post stent, epilepsy, diastolic CHF, anxiety, anemia.    PT Comments  Pt agreeable to session, however demonstrating increased fatigue this session. Pt requiring min A for bed mobility and mod A to boost to stand x4 throughout session, pt able to step pivot EOB>recliner with mod A to steady with sloe effortful steps and very flexed trunk with forearms resting on RW. Pt unable to progress gait this session due to decreased standing tolerance this session. Pt able to perform seated therex with limited tolerance due to fatigue. Pt agreeable to time up in chair at end of session. Pt continues to demonstrate some confusion throughout session, recalling leaving hospital over weekend and then returning. Current plan remains appropriate to address deficits and maximize functional independence and decrease caregiver burden. Pt continues to benefit from skilled PT services to progress toward functional mobility goals.     If plan is discharge home, recommend the following: Supervision due to cognitive status;Assist for transportation;Assistance with cooking/housework;Help with stairs or ramp for entrance;Direct supervision/assist for medications management;Direct supervision/assist for financial management;Two people to help with walking and/or transfers   Can travel by private vehicle     No  Equipment Recommendations  None recommended by PT    Recommendations for Other Services       Precautions / Restrictions Precautions Precautions: Fall Restrictions Weight Bearing Restrictions Per  Provider Order: No     Mobility  Bed Mobility Overal bed mobility: Needs Assistance Bed Mobility: Supine to Sit     Supine to sit: Min assist     General bed mobility comments: able to get to EOB with min assist, increased time, and verbal cues    Transfers Overall transfer level: Needs assistance Equipment used: Rolling walker (2 wheels) Transfers: Sit to/from Stand, Bed to chair/wheelchair/BSC Sit to Stand: Mod assist   Step pivot transfers: Mod assist       General transfer comment: mod A to stand from EOB x2 and recliner x2, asssit needed to boost from sitting surface, mod A to steady for taking steps around to chair    Ambulation/Gait               General Gait Details: deferred   Stairs             Wheelchair Mobility     Tilt Bed    Modified Rankin (Stroke Patients Only)       Balance Overall balance assessment: Needs assistance Sitting-balance support: No upper extremity supported, Feet supported Sitting balance-Leahy Scale: Poor Sitting balance - Comments: supervision for sitting balance Postural control: Right lateral lean Standing balance support: Bilateral upper extremity supported Standing balance-Leahy Scale: Poor Standing balance comment: Needs RW                            Cognition Arousal: Alert Behavior During Therapy: WFL for tasks assessed/performed Overall Cognitive Status: Impaired/Different from baseline Area of Impairment: Memory, Following commands, Safety/judgement, Awareness, Problem solving, Orientation, Attention                 Orientation Level: Time, Situation (believed she  went home over weekend and came back) Current Attention Level: Sustained Memory: Decreased short-term memory Following Commands: Follows one step commands consistently, Follows multi-step commands inconsistently, Follows one step commands with increased time Safety/Judgement: Decreased awareness of safety, Decreased  awareness of deficits Awareness: Emergent Problem Solving: Slow processing, Requires tactile cues, Requires verbal cues, Decreased initiation General Comments: alert and able to follow directions with increased time        Exercises General Exercises - Lower Extremity Long Arc Quad: AROM, Both, 5 reps, Seated    General Comments        Pertinent Vitals/Pain Pain Assessment Pain Assessment: Faces Faces Pain Scale: Hurts little more Pain Location: knees with standing Pain Descriptors / Indicators: Aching, Sore, Grimacing Pain Intervention(s): Monitored during session, Limited activity within patient's tolerance, Repositioned    Home Living                          Prior Function            PT Goals (current goals can now be found in the care plan section) Acute Rehab PT Goals Patient Stated Goal: return home PT Goal Formulation: With patient/family Time For Goal Achievement: 07/21/23 Progress towards PT goals: Progressing toward goals    Frequency    Min 1X/week      PT Plan      Co-evaluation              AM-PAC PT "6 Clicks" Mobility   Outcome Measure  Help needed turning from your back to your side while in a flat bed without using bedrails?: A Lot Help needed moving from lying on your back to sitting on the side of a flat bed without using bedrails?: A Lot Help needed moving to and from a bed to a chair (including a wheelchair)?: A Lot Help needed standing up from a chair using your arms (e.g., wheelchair or bedside chair)?: A Lot Help needed to walk in hospital room?: A Lot Help needed climbing 3-5 steps with a railing? : Total 6 Click Score: 11    End of Session Equipment Utilized During Treatment: Gait belt Activity Tolerance: Patient limited by fatigue Patient left: in chair;with call bell/phone within reach;with chair alarm set Nurse Communication: Mobility status PT Visit Diagnosis: Other abnormalities of gait and mobility  (R26.89);Muscle weakness (generalized) (M62.81);Other symptoms and signs involving the nervous system (R29.898)     Time: 1027-2536 PT Time Calculation (min) (ACUTE ONLY): 23 min  Charges:    $Therapeutic Activity: 23-37 mins PT General Charges $$ ACUTE PT VISIT: 1 Visit                     Azana Kiesler R. PTA Acute Rehabilitation Services Office: [phone]   Catalina Antigua 07/14/2023, 12:20 PM

## 2023-07-14 NOTE — Progress Notes (Signed)
IP rehab admissions - Please see rehab consult done by Dr. Shearon Stalls today.  I met with patient and her son at the bedside.  Son would like inpatient rehab and then home with sons providing 24/7 supervision and assistance.  I have opened the case with [facility] requesting CIR.  We will follow up once we hear back from insurance case manager.  Call for questions.  [phone]

## 2023-07-14 NOTE — Progress Notes (Signed)
PROGRESS NOTE  Michelle [name]:[id number] DOB: [birth year] DOA: 07/05/2023 PCP: Shelva Majestic, MD  Brief History   Michelle [name] is 89 y.o. female with dementia, wheelchair-bound at baseline, hypertension, hyperlipidemia, diabetes, seizure disorder, COPD, CKD stage III, CAD status post stenting, epilepsy, diastolic CHF, anxiety, anemia, who presented with altered mental status and seizure, found by family.  Had recent admissions in October or November for breakthrough seizures.  In the ED patient was found to be tachycardic, tachypneic, UA consistent with infection, CT head revealed small volume subarachnoid hemorrhage in the right frontal lobe.  Neurology was consulted.  Patient received antibiotics, Keppra, Ativan.  She underwent EEG which revealed no seizure.  It was suspected that breakthrough seizure was secondary to acute toxic metabolic encephalopathy compounded by history of dementia.  Neurology recommends following up with outpatient team who can consider increasing Lamictal and weaning her off of Keppra.  Blood pressure medications have been titrated.  Patient is status post 5days antibiotics.  Appears plan was to discharge to home with hospice on 12/25, however son has concerns with patient returning home. PT has recommended inpatient rehab. CIR to evaluate the patient today. A & P  Active Problems:   Type 2 diabetes mellitus without complication, with long-term current use of insulin (HCC)   Hyperlipidemia associated with type 2 diabetes mellitus (HCC)   Essential hypertension   COPD (chronic obstructive pulmonary disease) (HCC)   Chronic diastolic CHF (congestive heart failure) (HCC)   CAD- RCA PCI '90s, STEMI-RCA DES 02/18/14   Anxiety state   CKD (chronic kidney disease), stage III (HCC)   Anemia, iron deficiency   Dementia (HCC)   Epilepsy (HCC)   DNR (do not resuscitate)/DNI(Do Not Intubate)   Subarachnoid hemorrhage (HCC)   Seizure (HCC)   Breakthrough seizure,  superimposed on known seizure disorder Acute toxic metabolic encephalopathy, superimposed on chronic dementia Anxiety disorder - Breakthrough seizure appears to be secondary to UTI - Mental status appears to be improving to baseline - EEG without epileptiform activity - Neurology consulted, AED titration per neuro. - Continue with current dose of Keppra and Lamictal - Outpatient follow-up with neurology for increasing Lamictal and tapering off Keppra as they see fit - Continue home dose Seroquel - Continue PT/OT, initially planned for home health.  Family now requesting discharge to CIR. CIR to evaluate.   Drowsiness --Patien level of alertness is again improved today, although she still will drop off during conversation. Multifactorial given breakthrough seizures and dementia.  Acute worsening is likely secondary to new addition of Keppra.  However it is at a low dose. - Continue Seroquel 12.5 q hs.  Hesitant to further alter AEDs as she has been seizure-free since this new regimen. Monitor closely   Small right frontal subarachnoid hemorrhage - Seen on arrival.  Concerning for possible amyloid etiology - Workup with CTA head and neck - Seen by neurology - Avoid anticoagulants   Hypertension - Blood pressures have been low to normotensive.  Continue to titrate medications as tolerated - Needs close outpatient follow-up with PCP and blood pressure monitoring   Klebsiella UTI - Status posttreatment --Only resistance is intermediate resistance to nitrofurantoin.   CAD Hyperlipidemia - STEMI status post stent 2015 - Antiplatelets held secondary to subarachnoid hemorrhage - Neurology has cleared the patient to resume 81 as monotherapy and recommended to discontinue Plavix - Has been discussed with the patient's son at bedside   Chronic diastolic heart failure - EF 60 to 65%, on Lasix  as needed - Continue monitor - GDMT as BP tolerates   COPD, not in exacerbation - Continue home  inhaler - On room air   Ambulatory dysfunction - Wheelchair-bound at baseline - PT/OT as above - Likely discharge to SNF   Goals of care - Currently DNR, family requesting intubation if required procedures. - family considering hospice.  Palliative care consulted.   Diabetes - Continue sliding scale insulin, titrate up as tolerated --Glucoses well controlled 107-132 in the last 24 hours.   Endometrial mass - Known from prior to arrival - Followed by Gyn Onc, patient has previously declined surgical intervention or endometrial biopsy - Has intermittent vaginal bleeding, appears well-controlled with Provera   Severe deconditioning - Prior to this hospitalization patient was able to assist with transfers at home.  She is primarily cared for by her adult son who also has disabilities - Per PT evaluation patient is now 2 person max assist.  Would be unable to be managed at home with current care plan. - CIR to evaluate the patient.   DVT prophylaxis: SCDs    Code Status: Do not attempt resuscitation (DNR) PRE-ARREST INTERVENTIONS DESIRED Family Communication: Son [name] at bedside, discussed extensively. Disposition:  Status is: Inpatient, CIR at DC. CIR to evaluate today.     Consultants:  Neuro   Procedures:  N/a   Antimicrobials:  Anti-infectives(From admission, onward)      I have seen and examined this patient myself. I have spent 34 minutes in her evaluation and care.  Keyion Knack, DO [facility] Direct contact: see [email or web address]  7PM-7AM contact night coverage as above 07/14/2023, 2:29 PM  LOS: 8 days    Objective   Vitals:  Vitals:   07/14/23 0750 07/14/23 1107  BP: (!) 172/64 (!) 127/53  Pulse: 66 73  Resp: 18 18  Temp: 97.6 F (36.4 C) 97.7 F (36.5 C)  SpO2: 100%     Exam:  Constitutional:  The patient is awake, alert, and oriented x 3. No acute distress. ERespiratory:  No increased work of breathing. No wheezes, rales, or rhonchi No  tactile fremitus Cardiovascular:  Regular rate and rhythm No murmurs, ectopy, or gallups. No lateral PMI. No thrills. Abdomen:  Abdomen is soft, non-tender, non-distended No hernias, masses, or organomegaly Normoactive bowel sounds.  Musculoskeletal:  No cyanosis, clubbing, or edema Skin:  No rashes, lesions, ulcers palpation of skin: no induration or nodules Neurologic:  CN 2-12 intact Sensation all 4 extremities intact Psychiatric:  Mental status Mood, affect appropriate Orientation to person, place, time  judgment and insight appear intact  Anti-infectives (From admission, onward)    Start     Dose/Rate Route Frequency Ordered Stop   07/06/23 1200  cefTRIAXone (ROCEPHIN) 1 g in sodium chloride 0.9 % 100 mL IVPB  Status:  Discontinued        1 g 200 mL/hr over 30 Minutes Intravenous Every 24 hours 07/05/23 1427 07/11/23 1148   07/05/23 1230  cefTRIAXone (ROCEPHIN) 1 g in sodium chloride 0.9 % 100 mL IVPB        1 g 200 mL/hr over 30 Minutes Intravenous  Once 07/05/23 1216 07/05/23 1324       I have personally reviewed the following:   Today's Data  Vitals  Lab Data  BMP, CBC  Micro Data  Urine culture: Klebsiella aerogenes, sensitive to all except nitrofurantoin.  Imaging  CTA head and neck CXR CT head CT C- Spine  Cardiology Data  EKG  Scheduled Meds:  amLODipine  2.5 mg Oral Daily   carvedilol  12.5 mg Oral BID   enalapril  20 mg Oral QHS   fenofibrate  54 mg Oral Daily   hydrALAZINE  10 mg Oral TID   icosapent Ethyl  1 g Oral BID WC   And   icosapent Ethyl  2 g Oral QHS   insulin aspart  0-15 Units Subcutaneous Q4H   lamoTRIgine  50 mg Oral BID   levETIRAcetam  500 mg Oral Q12H   medroxyPROGESTERone  10 mg Oral Daily   mometasone-formoterol  2 puff Inhalation BID   nortriptyline  10 mg Oral QHS   pantoprazole  40 mg Oral Daily   QUEtiapine  12.5 mg Oral QPM   rosuvastatin  40 mg Oral Daily   sertraline  100 mg Oral QHS   sodium chloride  flush  3 mL Intravenous Q12H   umeclidinium bromide  1 puff Inhalation Daily    Active Problems:   Type 2 diabetes mellitus without complication, with long-term current use of insulin (HCC)   Hyperlipidemia associated with type 2 diabetes mellitus (HCC)   Essential hypertension   COPD (chronic obstructive pulmonary disease) (HCC)   Chronic diastolic CHF (congestive heart failure) (HCC)   CAD- RCA PCI '90s, STEMI-RCA DES 02/18/14   Anxiety state   CKD (chronic kidney disease), stage III (HCC)   Anemia, iron deficiency   Dementia (HCC)   Epilepsy (HCC)   DNR (do not resuscitate)/DNI(Do Not Intubate)   Subarachnoid hemorrhage (HCC)   Seizure (HCC)   LOS: 8 days

## 2023-07-15 DIAGNOSIS — J449 Chronic obstructive pulmonary disease, unspecified: Secondary | ICD-10-CM | POA: Diagnosis not present

## 2023-07-15 DIAGNOSIS — I609 Nontraumatic subarachnoid hemorrhage, unspecified: Secondary | ICD-10-CM | POA: Diagnosis not present

## 2023-07-15 DIAGNOSIS — R569 Unspecified convulsions: Secondary | ICD-10-CM | POA: Diagnosis not present

## 2023-07-15 LAB — BASIC METABOLIC PANEL
Anion gap: 11 (ref 5–15)
BUN: 30 mg/dL — ABNORMAL HIGH (ref 8–23)
CO2: 21 mmol/L — ABNORMAL LOW (ref 22–32)
Calcium: 9.8 mg/dL (ref 8.9–10.3)
Chloride: 105 mmol/L (ref 98–111)
Creatinine, Ser: 0.87 mg/dL (ref 0.44–1.00)
GFR, Estimated: >60 mL/min (ref >60)
Glucose, Bld: 123 mg/dL — ABNORMAL HIGH (ref 70–99)
Potassium: 4.2 mmol/L (ref 3.5–5.1)
Sodium: 137 mmol/L (ref 135–145)

## 2023-07-15 LAB — GLUCOSE, CAPILLARY
Glucose-Capillary: 132 mg/dL — ABNORMAL HIGH (ref 70–99)
Glucose-Capillary: 120 mg/dL — ABNORMAL HIGH (ref 70–99)
Glucose-Capillary: 129 mg/dL — ABNORMAL HIGH (ref 70–99)
Glucose-Capillary: 161 mg/dL — ABNORMAL HIGH (ref 70–99)
Glucose-Capillary: 141 mg/dL — ABNORMAL HIGH (ref 70–99)

## 2023-07-15 LAB — CBC WITH DIFFERENTIAL/PLATELET
Abs Immature Granulocytes: 0.02 10*3/uL (ref 0.00–0.07)
Basophils Absolute: 0 10*3/uL (ref 0.0–0.1)
Basophils Relative: 1 %
Eosinophils Absolute: 0.1 10*3/uL (ref 0.0–0.5)
Eosinophils Relative: 1 %
HCT: 39.9 % (ref 36.0–46.0)
Hemoglobin: 12.8 g/dL (ref 12.0–15.0)
Immature Granulocytes: 0 %
Lymphocytes Relative: 31 %
Lymphs Abs: 2.1 10*3/uL (ref 0.7–4.0)
MCH: 27.2 pg (ref 26.0–34.0)
MCHC: 32.1 g/dL (ref 30.0–36.0)
MCV: 84.7 fL (ref 80.0–100.0)
Monocytes Absolute: 0.7 10*3/uL (ref 0.1–1.0)
Monocytes Relative: 11 %
Neutro Abs: 3.8 10*3/uL (ref 1.7–7.7)
Neutrophils Relative %: 56 %
Platelets: 244 10*3/uL (ref 150–400)
RBC: 4.71 MIL/uL (ref 3.87–5.11)
RDW: 14.6 % (ref 11.5–15.5)
WBC: 6.7 10*3/uL (ref 4.0–10.5)
nRBC: 0 % (ref 0.0–0.2)

## 2023-07-15 NOTE — Progress Notes (Signed)
PROGRESS NOTE  Michelle [name] ZOX:[id number] DOB: [birth year] DOA: 07/05/2023 PCP: Shelva Majestic, MD  Brief History   Michelle [name] is 89 y.o. female with dementia, wheelchair-bound at baseline, hypertension, hyperlipidemia, diabetes, seizure disorder, COPD, CKD stage III, CAD status post stenting, epilepsy, diastolic CHF, anxiety, anemia, who presented with altered mental status and seizure, found by family.  Had recent admissions in October or November for breakthrough seizures.  In the ED patient was found to be tachycardic, tachypneic, UA consistent with infection, CT head revealed small volume subarachnoid hemorrhage in the right frontal lobe.  Neurology was consulted.  Patient received antibiotics, Keppra, Ativan.  She underwent EEG which revealed no seizure.  It was suspected that breakthrough seizure was secondary to acute toxic metabolic encephalopathy compounded by history of dementia.  Neurology recommends following up with outpatient team who can consider increasing Lamictal and weaning her off of Keppra.  Blood pressure medications have been titrated.  Patient is status post 5days antibiotics.  Appears plan was to discharge to home with hospice on 12/25, however son has concerns with patient returning home. PT has recommended inpatient rehab. Insurance authorization for CIR has been sought. A & P  Active Problems:   Type 2 diabetes mellitus without complication, with long-term current use of insulin (HCC)   Hyperlipidemia associated with type 2 diabetes mellitus (HCC)   Essential hypertension   COPD (chronic obstructive pulmonary disease) (HCC)   Chronic diastolic CHF (congestive heart failure) (HCC)   CAD- RCA PCI '90s, STEMI-RCA DES 02/18/14   Anxiety state   CKD (chronic kidney disease), stage III (HCC)   Anemia, iron deficiency   Dementia (HCC)   Epilepsy (HCC)   DNR (do not resuscitate)/DNI(Do Not Intubate)   Subarachnoid hemorrhage (HCC)   Seizure (HCC)   Breakthrough  seizure, superimposed on known seizure disorder Acute toxic metabolic encephalopathy, superimposed on chronic dementia Anxiety disorder - Breakthrough seizure appears to be secondary to UTI - Mental status appears to be improving to baseline - EEG without epileptiform activity - Neurology consulted, AED titration per neuro. - Continue with current dose of Keppra and Lamictal - Outpatient follow-up with neurology for increasing Lamictal and tapering off Keppra as they see fit - Continue home dose Seroquel - Continue PT/OT, initially planned for home health.  Family now requesting discharge to CIR. CIR to evaluate.   Drowsiness --Patien level of alertness is again improved today, although she still will drop off during conversation. Multifactorial given breakthrough seizures and dementia.  Acute worsening is likely secondary to new addition of Keppra.  However it is at a low dose. - Continue Seroquel 12.5 q hs.  Hesitant to further alter AEDs as she has been seizure-free since this new regimen. Monitor closely   Small right frontal subarachnoid hemorrhage - Seen on arrival.  Concerning for possible amyloid etiology - Workup with CTA head and neck - Seen by neurology - Avoid anticoagulants   Hypertension - Blood pressures have been low to normotensive.  Continue to titrate medications as tolerated - Needs close outpatient follow-up with PCP and blood pressure monitoring   Klebsiella UTI - Status posttreatment --Only resistance is intermediate resistance to nitrofurantoin.   CAD Hyperlipidemia - STEMI status post stent 2015 - Antiplatelets held secondary to subarachnoid hemorrhage - Neurology has cleared the patient to resume 81 as monotherapy and recommended to discontinue Plavix - Has been discussed with the patient's son at bedside   Chronic diastolic heart failure - EF 60 to 65%, on  Lasix as needed - Continue monitor - GDMT as BP tolerates   COPD, not in exacerbation -  Continue home inhaler - On room air   Ambulatory dysfunction - Wheelchair-bound at baseline - PT/OT as above - Likely discharge to SNF   Goals of care - Currently DNR, family requesting intubation if required procedures. - family considering hospice.  Palliative care consulted.   Diabetes - Continue sliding scale insulin, titrate up as tolerated --Glucoses well controlled 107-132 in the last 24 hours.   Endometrial mass - Known from prior to arrival - Followed by Gyn Onc, patient has previously declined surgical intervention or endometrial biopsy - Has intermittent vaginal bleeding, appears well-controlled with Provera   Severe deconditioning - Prior to this hospitalization patient was able to assist with transfers at home.  She is primarily cared for by her adult son who also has disabilities - Per PT evaluation patient is now 2 person max assist.  Would be unable to be managed at home with current care plan. - CIR to evaluate the patient.   DVT prophylaxis: SCDs    Code Status: Do not attempt resuscitation (DNR) PRE-ARREST INTERVENTIONS DESIRED Family Communication: Son Michelle [name] at bedside, discussed extensively. Disposition:  Status is: Inpatient, CIR at DC. CIR to evaluate today.     Consultants:  Neuro   Procedures:  N/a   Antimicrobials:  Anti-infectives(From admission, onward)      I have seen and examined this patient myself. I have spent 34 minutes in her evaluation and care.  Felisia Balcom, DO [facility] Direct contact: see [email or web address]  7PM-7AM contact night coverage as above 07/15/2023, 3:35 PM  LOS: 9 days    Objective   Vitals:  Vitals:   07/15/23 1128 07/15/23 1527  BP: (!) 121/50 (!) 122/47  Pulse: 65 62  Resp: 17 16  Temp: 98.6 F (37 C) 98.1 F (36.7 C)  SpO2: 99% 97%    Exam:  Constitutional:  The patient is awake, alert, and oriented x 3. No acute distress. ERespiratory:  No increased work of breathing. No wheezes, rales,  or rhonchi No tactile fremitus Cardiovascular:  Regular rate and rhythm No murmurs, ectopy, or gallups. No lateral PMI. No thrills. Abdomen:  Abdomen is soft, non-tender, non-distended No hernias, masses, or organomegaly Normoactive bowel sounds.  Musculoskeletal:  No cyanosis, clubbing, or edema Skin:  No rashes, lesions, ulcers palpation of skin: no induration or nodules Neurologic:  CN 2-12 intact Sensation all 4 extremities intact Psychiatric:  Mental status Mood, affect appropriate Orientation to person, place, time  judgment and insight appear intact  Anti-infectives (From admission, onward)    Start     Dose/Rate Route Frequency Ordered Stop   07/06/23 1200  cefTRIAXone (ROCEPHIN) 1 g in sodium chloride 0.9 % 100 mL IVPB  Status:  Discontinued        1 g 200 mL/hr over 30 Minutes Intravenous Every 24 hours 07/05/23 1427 07/11/23 1148   07/05/23 1230  cefTRIAXone (ROCEPHIN) 1 g in sodium chloride 0.9 % 100 mL IVPB        1 g 200 mL/hr over 30 Minutes Intravenous  Once 07/05/23 1216 07/05/23 1324       I have personally reviewed the following:   Today's Data  Vitals  Lab Data  BMP, CBC  Micro Data  Urine culture: Klebsiella aerogenes, sensitive to all except nitrofurantoin.  Imaging  CTA head and neck CXR CT head CT C- Spine  Cardiology Data  EKG  Scheduled Meds:  amLODipine  2.5 mg Oral Daily   carvedilol  12.5 mg Oral BID   enalapril  20 mg Oral QHS   fenofibrate  54 mg Oral Daily   hydrALAZINE  10 mg Oral TID   icosapent Ethyl  1 g Oral BID WC   And   icosapent Ethyl  2 g Oral QHS   insulin aspart  0-15 Units Subcutaneous Q4H   lamoTRIgine  50 mg Oral BID   levETIRAcetam  500 mg Oral Q12H   medroxyPROGESTERone  10 mg Oral Daily   mometasone-formoterol  2 puff Inhalation BID   nortriptyline  10 mg Oral QHS   pantoprazole  40 mg Oral Daily   QUEtiapine  12.5 mg Oral QPM   rosuvastatin  40 mg Oral Daily   sertraline  100 mg Oral QHS    sodium chloride flush  3 mL Intravenous Q12H   umeclidinium bromide  1 puff Inhalation Daily    Active Problems:   Type 2 diabetes mellitus without complication, with long-term current use of insulin (HCC)   Hyperlipidemia associated with type 2 diabetes mellitus (HCC)   Essential hypertension   COPD (chronic obstructive pulmonary disease) (HCC)   Chronic diastolic CHF (congestive heart failure) (HCC)   CAD- RCA PCI '90s, STEMI-RCA DES 02/18/14   Anxiety state   CKD (chronic kidney disease), stage III (HCC)   Anemia, iron deficiency   Dementia (HCC)   Epilepsy (HCC)   DNR (do not resuscitate)/DNI(Do Not Intubate)   Subarachnoid hemorrhage (HCC)   Seizure (HCC)   LOS: 9 days

## 2023-07-15 NOTE — Plan of Care (Signed)
  Problem: Education: Goal: Ability to describe self-care measures that may prevent or decrease complications (Diabetes Survival Skills Education) will improve Outcome: Progressing Goal: Individualized Educational Video(s) Outcome: Progressing   Problem: Coping: Goal: Ability to adjust to condition or change in health will improve Outcome: Progressing   Problem: Fluid Volume: Goal: Ability to maintain a balanced intake and output will improve Outcome: Progressing   Problem: Health Behavior/Discharge Planning: Goal: Ability to identify and utilize available resources and services will improve Outcome: Progressing Goal: Ability to manage health-related needs will improve Outcome: Progressing   Problem: Metabolic: Goal: Ability to maintain appropriate glucose levels will improve Outcome: Progressing   Problem: Nutritional: Goal: Maintenance of adequate nutrition will improve Outcome: Progressing Goal: Progress toward achieving an optimal weight will improve Outcome: Progressing   Problem: Skin Integrity: Goal: Risk for impaired skin integrity will decrease Outcome: Progressing   Problem: Tissue Perfusion: Goal: Adequacy of tissue perfusion will improve Outcome: Progressing   Problem: Education: Goal: Knowledge of General Education information will improve Description: Including pain rating scale, medication(s)/side effects and non-pharmacologic comfort measures Outcome: Progressing   Problem: Health Behavior/Discharge Planning: Goal: Ability to manage health-related needs will improve Outcome: Progressing   Problem: Clinical Measurements: Goal: Ability to maintain clinical measurements within normal limits will improve Outcome: Progressing Goal: Will remain free from infection Outcome: Progressing Goal: Diagnostic test results will improve Outcome: Progressing Goal: Respiratory complications will improve Outcome: Progressing Goal: Cardiovascular complication will  be avoided Outcome: Progressing   Problem: Activity: Goal: Risk for activity intolerance will decrease Outcome: Progressing   Problem: Nutrition: Goal: Adequate nutrition will be maintained Outcome: Progressing   Problem: Coping: Goal: Level of anxiety will decrease Outcome: Progressing   Problem: Elimination: Goal: Will not experience complications related to bowel motility Outcome: Progressing Goal: Will not experience complications related to urinary retention Outcome: Progressing   Problem: Pain Management: Goal: General experience of comfort will improve Outcome: Progressing   Problem: Safety: Goal: Ability to remain free from injury will improve Outcome: Progressing   Problem: Skin Integrity: Goal: Risk for impaired skin integrity will decrease Outcome: Progressing   Problem: Education: Goal: Expressions of having a comfortable level of knowledge regarding the disease process will increase Outcome: Progressing   Problem: Coping: Goal: Ability to adjust to condition or change in health will improve Outcome: Progressing Goal: Ability to identify appropriate support needs will improve Outcome: Progressing   Problem: Health Behavior/Discharge Planning: Goal: Compliance with prescribed medication regimen will improve Outcome: Progressing   Problem: Medication: Goal: Risk for medication side effects will decrease Outcome: Progressing   Problem: Clinical Measurements: Goal: Complications related to the disease process, condition or treatment will be avoided or minimized Outcome: Progressing Goal: Diagnostic test results will improve Outcome: Progressing   Problem: Safety: Goal: Verbalization of understanding the information provided will improve Outcome: Progressing   Problem: Self-Concept: Goal: Level of anxiety will decrease Outcome: Progressing Goal: Ability to verbalize feelings about condition will improve Outcome: Progressing

## 2023-07-16 DIAGNOSIS — Z515 Encounter for palliative care: Secondary | ICD-10-CM | POA: Diagnosis not present

## 2023-07-16 LAB — BASIC METABOLIC PANEL
Anion gap: 8 (ref 5–15)
BUN: 32 mg/dL — ABNORMAL HIGH (ref 8–23)
CO2: 23 mmol/L (ref 22–32)
Calcium: 9.7 mg/dL (ref 8.9–10.3)
Chloride: 106 mmol/L (ref 98–111)
Creatinine, Ser: 0.9 mg/dL (ref 0.44–1.00)
GFR, Estimated: >60 mL/min (ref >60)
Glucose, Bld: 107 mg/dL — ABNORMAL HIGH (ref 70–99)
Potassium: 4.4 mmol/L (ref 3.5–5.1)
Sodium: 137 mmol/L (ref 135–145)

## 2023-07-16 LAB — CBC WITH DIFFERENTIAL/PLATELET
Abs Immature Granulocytes: 0.02 10*3/uL (ref 0.00–0.07)
Basophils Absolute: 0.1 10*3/uL (ref 0.0–0.1)
Basophils Relative: 1 %
Eosinophils Absolute: 0.1 10*3/uL (ref 0.0–0.5)
Eosinophils Relative: 1 %
HCT: 38.4 % (ref 36.0–46.0)
Hemoglobin: 12.2 g/dL (ref 12.0–15.0)
Immature Granulocytes: 0 %
Lymphocytes Relative: 35 %
Lymphs Abs: 2.4 10*3/uL (ref 0.7–4.0)
MCH: 27.2 pg (ref 26.0–34.0)
MCHC: 31.8 g/dL (ref 30.0–36.0)
MCV: 85.5 fL (ref 80.0–100.0)
Monocytes Absolute: 0.7 10*3/uL (ref 0.1–1.0)
Monocytes Relative: 11 %
Neutro Abs: 3.6 10*3/uL (ref 1.7–7.7)
Neutrophils Relative %: 52 %
Platelets: 255 10*3/uL (ref 150–400)
RBC: 4.49 MIL/uL (ref 3.87–5.11)
RDW: 15 % (ref 11.5–15.5)
WBC: 6.9 10*3/uL (ref 4.0–10.5)
nRBC: 0 % (ref 0.0–0.2)

## 2023-07-16 LAB — GLUCOSE, CAPILLARY
Glucose-Capillary: 109 mg/dL — ABNORMAL HIGH (ref 70–99)
Glucose-Capillary: 119 mg/dL — ABNORMAL HIGH (ref 70–99)
Glucose-Capillary: 128 mg/dL — ABNORMAL HIGH (ref 70–99)
Glucose-Capillary: 131 mg/dL — ABNORMAL HIGH (ref 70–99)
Glucose-Capillary: 132 mg/dL — ABNORMAL HIGH (ref 70–99)
Glucose-Capillary: 134 mg/dL — ABNORMAL HIGH (ref 70–99)
Glucose-Capillary: 166 mg/dL — ABNORMAL HIGH (ref 70–99)

## 2023-07-16 NOTE — Progress Notes (Signed)
° °  Palliative Medicine Inpatient Follow Up Note HPI: Michelle [name] is 89 y.o. female with dementia, wheelchair-bound at baseline, hypertension, hyperlipidemia, diabetes, seizure disorder, COPD, CKD stage III, CAD status post stenting, epilepsy, diastolic CHF, anxiety, anemia, who presented with altered mental status and seizure, found by family.    Palliative care has been asked to get involved for further goals of care conversations.    Today's Discussion 07/16/2023  *Please note that this is a verbal dictation therefore any spelling or grammatical errors are due to the "Dragon Medical One" system interpretation.  Chart reviewed inclusive of vital signs, progress notes, laboratory results, and diagnostic images.  I met with [name] at bedside this morning. She is awake and alert to self only. She shares that she is not in pain, short of breath, or nauseated this morning.    Per patients RN, Drucie Ip there are no acute Palliative Care concerns.   Awaiting insurance approval for CIR, if not approved patient may need to transition home with hospice services.  Questions and concerns addressed/Palliative Support Provided.   Objective Assessment: Vital Signs Vitals:   07/16/23 0832 07/16/23 0904  BP: (!) 119/49   Pulse: 63   Resp: 18   Temp: 97.7 F (36.5 C)   SpO2: 100% 98%    Intake/Output Summary (Last 24 hours) at 07/16/2023 1111 Last data filed at 07/15/2023 1800 Gross per 24 hour  Intake 400 ml  Output --  Net 400 ml   Last Weight  Most recent update: 07/05/2023  9:05 AM    Weight  75.8 kg (167 lb 1.7 oz)            Gen:  Elderly Caucasian F in NAD HEENT: moist mucous membranes CV: Regular rate and rhythm  PULM:  On RA, breathing is even and nonlabored ABD: soft/nontender  EXT: No edema  Neuro: Alert and oriented to self  SUMMARY OF RECOMMENDATIONS   DNAR/DNI   Patients son(s) met with --> they are aware of patients frail state and high risk for decline    Appreciate TOC helping with placement --> being evaluated by acute rehabilitation, awaiting insurance review   Appreciate referral to [facility] to evaluate for potential hospice services --> Family would like to withdraw from [name] Palliative services   Ongoing PMT support  Time Spent: 25 ______________________________________________________________________________________ Lamarr Lulas [place] Palliative Medicine Team Team Cell Phone: [phone] Please utilize secure chat with additional questions, if there is no response within 30 minutes please call the above phone number  Palliative Medicine Team providers are available by phone from 7am to 7pm daily and can be reached through the team cell phone.  Should this patient require assistance outside of these hours, please call the patient's attending physician.

## 2023-07-16 NOTE — Progress Notes (Signed)
PROGRESS NOTE  Michelle [name] AOZ:[id number] DOB: 09-08-1932 DOA: 07/05/2023 PCP: Shelva Majestic, MD  Brief History   Michelle [name] is 89 y.o. female with dementia, wheelchair-bound at baseline, hypertension, hyperlipidemia, diabetes, seizure disorder, COPD, CKD stage III, CAD status post stenting, epilepsy, diastolic CHF, anxiety, anemia, who presented with altered mental status and seizure, found by family.  Had recent admissions in October or November for breakthrough seizures.  In the ED patient was found to be tachycardic, tachypneic, UA consistent with infection, CT head revealed small volume subarachnoid hemorrhage in the right frontal lobe.  Neurology was consulted.  Patient received antibiotics, Keppra, Ativan.  She underwent EEG which revealed no seizure.  It was suspected that breakthrough seizure was secondary to acute toxic metabolic encephalopathy compounded by history of dementia.  Neurology recommends following up with outpatient team who can consider increasing Lamictal and weaning her off of Keppra.  Blood pressure medications have been titrated.  Patient is status post 5days antibiotics.  Appears plan was to discharge to home with hospice on 12/25, however son has concerns with patient returning home. PT has recommended inpatient rehab. Insurance authorization for CIR has been sought. A & P  Active Problems:   Type 2 diabetes mellitus without complication, with long-term current use of insulin (HCC)   Hyperlipidemia associated with type 2 diabetes mellitus (HCC)   Essential hypertension   COPD (chronic obstructive pulmonary disease) (HCC)   Chronic diastolic CHF (congestive heart failure) (HCC)   CAD- RCA PCI '90s, STEMI-RCA DES 02/18/14   Anxiety state   CKD (chronic kidney disease), stage III (HCC)   Anemia, iron deficiency   Dementia (HCC)   Epilepsy (HCC)   DNR (do not resuscitate)/DNI(Do Not Intubate)   Subarachnoid hemorrhage (HCC)   Seizure (HCC)   Breakthrough  seizure, superimposed on known seizure disorder Acute toxic metabolic encephalopathy, superimposed on chronic dementia Anxiety disorder - Breakthrough seizure appears to be secondary to UTI - Mental status appears to be improving to baseline - EEG without epileptiform activity - Neurology consulted, AED titration per neuro. - Continue with current dose of Keppra and Lamictal - Outpatient follow-up with neurology for increasing Lamictal and tapering off Keppra as they see fit - Continue home dose Seroquel - Continue PT/OT, initially planned for home health.  Family now requesting discharge to CIR. CIR to evaluate.   Drowsiness --Patien level of alertness is again improved today, although she still will drop off during conversation. Multifactorial given breakthrough seizures and dementia.  Acute worsening is likely secondary to new addition of Keppra.  However it is at a low dose. - Continue Seroquel 12.5 q hs.  Hesitant to further alter AEDs as she has been seizure-free since this new regimen. Monitor closely   Small right frontal subarachnoid hemorrhage - Seen on arrival.  Concerning for possible amyloid etiology - Workup with CTA head and neck - Seen by neurology - Avoid anticoagulants   Hypertension - Blood pressures have been low to normotensive.  Continue to titrate medications as tolerated - Needs close outpatient follow-up with PCP and blood pressure monitoring   Klebsiella UTI - Status posttreatment --Only resistance is intermediate resistance to nitrofurantoin.   CAD Hyperlipidemia - STEMI status post stent 2015 - Antiplatelets held secondary to subarachnoid hemorrhage - Neurology has cleared the patient to resume 81 as monotherapy and recommended to discontinue Plavix - Has been discussed with the patient's son at bedside   Chronic diastolic heart failure - EF 60 to 65%, on  Lasix as needed - Continue monitor - GDMT as BP tolerates   COPD, not in exacerbation -  Continue home inhaler - On room air   Ambulatory dysfunction - Wheelchair-bound at baseline - PT/OT as above - Likely discharge to SNF   Goals of care - Currently DNR, family requesting intubation if required procedures. - family considering hospice.  Palliative care consulted.   Diabetes - Continue sliding scale insulin, titrate up as tolerated --Glucoses well controlled 107-132 in the last 24 hours.   Endometrial mass - Known from prior to arrival - Followed by Gyn Onc, patient has previously declined surgical intervention or endometrial biopsy - Has intermittent vaginal bleeding, appears well-controlled with Provera   Severe deconditioning - Prior to this hospitalization patient was able to assist with transfers at home.  She is primarily cared for by her adult son who also has disabilities - Per PT evaluation patient is now 2 person max assist.  Would be unable to be managed at home with current care plan. - CIR to evaluate the patient.   DVT prophylaxis: SCDs    Code Status: Do not attempt resuscitation (DNR) PRE-ARREST INTERVENTIONS DESIRED Family Communication: Son [name] at bedside, discussed extensively. Disposition:  Status is: Inpatient, CIR at DC. CIR to evaluate today.     Consultants:  Neuro   Procedures:  N/a   Antimicrobials:  Anti-infectives(From admission, onward)      I have seen and examined this patient myself. I have spent 34 minutes in her evaluation and care.  Emilya Justen, DO [facility] Direct contact: see [email or web address]  7PM-7AM contact night coverage as above 07/16/2023, 3:20 PM  LOS: 10 days    Objective   Vitals:  Vitals:   07/16/23 0904 07/16/23 1134  BP:  117/61  Pulse:  61  Resp:  18  Temp:  98 F (36.7 C)  SpO2: 98% 96%    Exam:  Constitutional:  The patient is awake, alert, and oriented x 3. No acute distress. ERespiratory:  No increased work of breathing. No wheezes, rales, or rhonchi No tactile  fremitus Cardiovascular:  Regular rate and rhythm No murmurs, ectopy, or gallups. No lateral PMI. No thrills. Abdomen:  Abdomen is soft, non-tender, non-distended No hernias, masses, or organomegaly Normoactive bowel sounds.  Musculoskeletal:  No cyanosis, clubbing, or edema Skin:  No rashes, lesions, ulcers palpation of skin: no induration or nodules Neurologic:  CN 2-12 intact Sensation all 4 extremities intact Psychiatric:  Mental status Mood, affect appropriate Orientation to person, place, time  judgment and insight appear intact  Anti-infectives (From admission, onward)    Start     Dose/Rate Route Frequency Ordered Stop   07/06/23 1200  cefTRIAXone (ROCEPHIN) 1 g in sodium chloride 0.9 % 100 mL IVPB  Status:  Discontinued        1 g 200 mL/hr over 30 Minutes Intravenous Every 24 hours 07/05/23 1427 07/11/23 1148   07/05/23 1230  cefTRIAXone (ROCEPHIN) 1 g in sodium chloride 0.9 % 100 mL IVPB        1 g 200 mL/hr over 30 Minutes Intravenous  Once 07/05/23 1216 07/05/23 1324       I have personally reviewed the following:   Today's Data  Vitals  Lab Data  BMP, CBC  Micro Data  Urine culture: Klebsiella aerogenes, sensitive to all except nitrofurantoin.  Imaging  CTA head and neck CXR CT head CT C- Spine  Cardiology Data  EKG  Scheduled Meds:  amLODipine  2.5 mg  Oral Daily   carvedilol  12.5 mg Oral BID   enalapril  20 mg Oral QHS   fenofibrate  54 mg Oral Daily   hydrALAZINE  10 mg Oral TID   icosapent Ethyl  1 g Oral BID WC   And   icosapent Ethyl  2 g Oral QHS   insulin aspart  0-15 Units Subcutaneous Q4H   lamoTRIgine  50 mg Oral BID   levETIRAcetam  500 mg Oral Q12H   medroxyPROGESTERone  10 mg Oral Daily   mometasone-formoterol  2 puff Inhalation BID   nortriptyline  10 mg Oral QHS   pantoprazole  40 mg Oral Daily   QUEtiapine  12.5 mg Oral QPM   rosuvastatin  40 mg Oral Daily   sertraline  100 mg Oral QHS   sodium chloride flush  3  mL Intravenous Q12H   umeclidinium bromide  1 puff Inhalation Daily    Active Problems:   Type 2 diabetes mellitus without complication, with long-term current use of insulin (HCC)   Hyperlipidemia associated with type 2 diabetes mellitus (HCC)   Essential hypertension   COPD (chronic obstructive pulmonary disease) (HCC)   Chronic diastolic CHF (congestive heart failure) (HCC)   CAD- RCA PCI '90s, STEMI-RCA DES 02/18/14   Anxiety state   CKD (chronic kidney disease), stage III (HCC)   Anemia, iron deficiency   Dementia (HCC)   Epilepsy (HCC)   DNR (do not resuscitate)/DNI(Do Not Intubate)   Subarachnoid hemorrhage (HCC)   Seizure (HCC)   LOS: 10 days

## 2023-07-16 NOTE — Plan of Care (Signed)
°  Problem: Nutritional: Goal: Maintenance of adequate nutrition will improve Outcome: Progressing   Problem: Skin Integrity: Goal: Risk for impaired skin integrity will decrease Outcome: Progressing   Problem: Activity: Goal: Risk for activity intolerance will decrease Outcome: Progressing   Problem: Nutrition: Goal: Adequate nutrition will be maintained Outcome: Progressing   Problem: Coping: Goal: Level of anxiety will decrease Outcome: Progressing   Problem: Elimination: Goal: Will not experience complications related to bowel motility Outcome: Progressing Goal: Will not experience complications related to urinary retention Outcome: Progressing

## 2023-07-16 NOTE — Plan of Care (Signed)
  Problem: Education: Goal: Ability to describe self-care measures that may prevent or decrease complications (Diabetes Survival Skills Education) will improve Outcome: Progressing Goal: Individualized Educational Video(s) Outcome: Progressing   Problem: Coping: Goal: Ability to adjust to condition or change in health will improve Outcome: Progressing   Problem: Fluid Volume: Goal: Ability to maintain a balanced intake and output will improve Outcome: Progressing   Problem: Health Behavior/Discharge Planning: Goal: Ability to identify and utilize available resources and services will improve Outcome: Progressing Goal: Ability to manage health-related needs will improve Outcome: Progressing   Problem: Metabolic: Goal: Ability to maintain appropriate glucose levels will improve Outcome: Progressing   Problem: Nutritional: Goal: Maintenance of adequate nutrition will improve Outcome: Progressing Goal: Progress toward achieving an optimal weight will improve Outcome: Progressing   Problem: Skin Integrity: Goal: Risk for impaired skin integrity will decrease Outcome: Progressing   Problem: Tissue Perfusion: Goal: Adequacy of tissue perfusion will improve Outcome: Progressing   Problem: Education: Goal: Knowledge of General Education information will improve Description: Including pain rating scale, medication(s)/side effects and non-pharmacologic comfort measures Outcome: Progressing   Problem: Health Behavior/Discharge Planning: Goal: Ability to manage health-related needs will improve Outcome: Progressing   Problem: Clinical Measurements: Goal: Ability to maintain clinical measurements within normal limits will improve Outcome: Progressing Goal: Will remain free from infection Outcome: Progressing Goal: Diagnostic test results will improve Outcome: Progressing Goal: Respiratory complications will improve Outcome: Progressing Goal: Cardiovascular complication will  be avoided Outcome: Progressing   Problem: Activity: Goal: Risk for activity intolerance will decrease Outcome: Progressing   Problem: Nutrition: Goal: Adequate nutrition will be maintained Outcome: Progressing   Problem: Coping: Goal: Level of anxiety will decrease Outcome: Progressing   Problem: Elimination: Goal: Will not experience complications related to bowel motility Outcome: Progressing Goal: Will not experience complications related to urinary retention Outcome: Progressing   Problem: Pain Management: Goal: General experience of comfort will improve Outcome: Progressing   Problem: Safety: Goal: Ability to remain free from injury will improve Outcome: Progressing   Problem: Skin Integrity: Goal: Risk for impaired skin integrity will decrease Outcome: Progressing   Problem: Education: Goal: Expressions of having a comfortable level of knowledge regarding the disease process will increase Outcome: Progressing   Problem: Coping: Goal: Ability to adjust to condition or change in health will improve Outcome: Progressing Goal: Ability to identify appropriate support needs will improve Outcome: Progressing   Problem: Health Behavior/Discharge Planning: Goal: Compliance with prescribed medication regimen will improve Outcome: Progressing   Problem: Medication: Goal: Risk for medication side effects will decrease Outcome: Progressing   Problem: Clinical Measurements: Goal: Complications related to the disease process, condition or treatment will be avoided or minimized Outcome: Progressing Goal: Diagnostic test results will improve Outcome: Progressing   Problem: Safety: Goal: Verbalization of understanding the information provided will improve Outcome: Progressing   Problem: Self-Concept: Goal: Level of anxiety will decrease Outcome: Progressing Goal: Ability to verbalize feelings about condition will improve Outcome: Progressing

## 2023-07-17 ENCOUNTER — Other Ambulatory Visit: Payer: Self-pay | Admitting: Family Medicine

## 2023-07-17 DIAGNOSIS — R569 Unspecified convulsions: Secondary | ICD-10-CM | POA: Diagnosis not present

## 2023-07-17 LAB — CBC WITH DIFFERENTIAL/PLATELET
Abs Immature Granulocytes: 0.03 10*3/uL (ref 0.00–0.07)
Basophils Absolute: 0.1 10*3/uL (ref 0.0–0.1)
Basophils Relative: 1 %
Eosinophils Absolute: 0.1 10*3/uL (ref 0.0–0.5)
Eosinophils Relative: 1 %
HCT: 42.3 % (ref 36.0–46.0)
Hemoglobin: 13.6 g/dL (ref 12.0–15.0)
Immature Granulocytes: 0 %
Lymphocytes Relative: 30 %
Lymphs Abs: 2.4 10*3/uL (ref 0.7–4.0)
MCH: 27.1 pg (ref 26.0–34.0)
MCHC: 32.2 g/dL (ref 30.0–36.0)
MCV: 84.3 fL (ref 80.0–100.0)
Monocytes Absolute: 1 10*3/uL (ref 0.1–1.0)
Monocytes Relative: 12 %
Neutro Abs: 4.5 10*3/uL (ref 1.7–7.7)
Neutrophils Relative %: 56 %
Platelets: 247 10*3/uL (ref 150–400)
RBC: 5.02 MIL/uL (ref 3.87–5.11)
RDW: 14.6 % (ref 11.5–15.5)
WBC: 8.1 10*3/uL (ref 4.0–10.5)
nRBC: 0 % (ref 0.0–0.2)

## 2023-07-17 LAB — BASIC METABOLIC PANEL
Anion gap: 11 (ref 5–15)
BUN: 28 mg/dL — ABNORMAL HIGH (ref 8–23)
CO2: 24 mmol/L (ref 22–32)
Calcium: 10.2 mg/dL (ref 8.9–10.3)
Chloride: 104 mmol/L (ref 98–111)
Creatinine, Ser: 0.93 mg/dL (ref 0.44–1.00)
GFR, Estimated: 59 mL/min — ABNORMAL LOW (ref >60)
Glucose, Bld: 121 mg/dL — ABNORMAL HIGH (ref 70–99)
Potassium: 4.3 mmol/L (ref 3.5–5.1)
Sodium: 139 mmol/L (ref 135–145)

## 2023-07-17 LAB — GLUCOSE, CAPILLARY
Glucose-Capillary: 101 mg/dL — ABNORMAL HIGH (ref 70–99)
Glucose-Capillary: 116 mg/dL — ABNORMAL HIGH (ref 70–99)
Glucose-Capillary: 126 mg/dL — ABNORMAL HIGH (ref 70–99)
Glucose-Capillary: 133 mg/dL — ABNORMAL HIGH (ref 70–99)
Glucose-Capillary: 135 mg/dL — ABNORMAL HIGH (ref 70–99)
Glucose-Capillary: 139 mg/dL — ABNORMAL HIGH (ref 70–99)
Glucose-Capillary: 144 mg/dL — ABNORMAL HIGH (ref 70–99)
Glucose-Capillary: 151 mg/dL — ABNORMAL HIGH (ref 70–99)
Glucose-Capillary: 166 mg/dL — ABNORMAL HIGH (ref 70–99)
Glucose-Capillary: 235 mg/dL — ABNORMAL HIGH (ref 70–99)

## 2023-07-17 MED ORDER — LEVETIRACETAM 250 MG PO TABS
250.0000 mg | ORAL_TABLET | Freq: Two times a day (BID) | ORAL | Status: AC
  Administered 2023-07-17 – 2023-07-18 (×2): 250 mg via ORAL
  Filled 2023-07-17 (×2): qty 1

## 2023-07-17 MED ORDER — LEVETIRACETAM 250 MG PO TABS: 250.0000 mg | ORAL_TABLET | Freq: Two times a day (BID) | ORAL | Status: DC

## 2023-07-17 NOTE — Progress Notes (Addendum)
   Palliative Medicine Inpatient Follow Up Note HPI: Michelle [name] is 89 y.o. female with dementia, wheelchair-bound at baseline, hypertension, hyperlipidemia, diabetes, seizure disorder, COPD, CKD stage III, CAD status post stenting, epilepsy, diastolic CHF, anxiety, anemia, who presented with altered mental status and seizure, found by family.    Palliative care has been asked to get involved for further goals of care conversations.    Today's Discussion 07/17/2023  *Please note that this is a verbal dictation therefore any spelling or grammatical errors are due to the "Dragon Medical One" system interpretation.  Chart reviewed inclusive of vital signs, progress notes, laboratory results, and diagnostic images.  I met with patients RN, Drucie Ip who shares patient was out of bed for about an hour and a half this morning and ate 25% of her breakfast.  Upon assessment, Michelle [name] was lying in her bed in NAD. She was easily arousable and denied pain or nausea.  I spoke to patients son, Michelle [name] at bedside. He asks why she is so sleepy. I reviewed with him that she had been out of bed today though nursing would try to get her up later in the morning. He asks why she is so sleepy. We discussed delirium and the need to ensure good precautions for her. She had apparently been up more throughout the evening hours. I provided education that often when hospitalized patients circadian rhythm can get dyssynchronous.  Michelle [name] and I discussed patients evaluation by CIR and that it has not been determined if this will even be an option. Reviewed the plan for [facility] evaluation as well.   Questions and concerns addressed/Palliative Support Provided.   Objective Assessment: Vital Signs Vitals:   07/17/23 1126 07/17/23 1135  BP:  (!) 114/59  Pulse: 76 69  Resp: 16 18  Temp:  98 F (36.7 C)  SpO2: 95% 98%    Intake/Output Summary (Last 24 hours) at 07/17/2023 1259 Last data filed at  07/16/2023 1800 Gross per 24 hour  Intake 150 ml  Output --  Net 150 ml   Last Weight  Most recent update: 07/05/2023  9:05 AM    Weight  75.8 kg (167 lb 1.7 oz)            Gen:  Elderly Caucasian F in NAD HEENT: moist mucous membranes CV: Regular rate and rhythm  PULM:  On RA, breathing is even and nonlabored ABD: soft/nontender  EXT: No edema  Neuro: Alert and oriented to self  SUMMARY OF RECOMMENDATIONS   DNAR/DNI   Appreciate TOC helping with placement --> being evaluated by acute rehabilitation, awaiting insurance review   Appreciate referral to [facility] to evaluate for potential hospice services --> Family would like to withdraw from [facility] Palliative services   Ongoing PMT support  MDM: Moderate ______________________________________________________________________________________ Lamarr Lulas [place] Palliative Medicine Team Team Cell Phone: [phone] Please utilize secure chat with additional questions, if there is no response within 30 minutes please call the above phone number  Palliative Medicine Team providers are available by phone from 7am to 7pm daily and can be reached through the team cell phone.  Should this patient require assistance outside of these hours, please call the patient's attending physician.

## 2023-07-17 NOTE — TOC Progression Note (Signed)
Transition of Care [facility]) - Progression Note    Patient Details  Name: Michelle [name] MRN: [id number] Date of Birth: [birth year]  [facility]) CM/SW Contact  Kermit Balo, RN Phone Number: 07/17/2023, 3:53 PM  Clinical Narrative:     Per family they are going to appeal CIR denial.  TOC following.  Expected Discharge Plan: IP Rehab Facility Barriers to Discharge: Continued Medical Work up  Expected Discharge Plan and Services   Discharge Planning Services: CM Consult Post Acute Care Choice: Home Health Living arrangements for the past 2 months: Single Family Home                                       Social Determinants of Health (SDOH) Interventions SDOH Screenings   Food Insecurity: Patient Unable To Answer (07/05/2023)  Housing: Patient Unable To Answer (07/05/2023)  Transportation Needs: Patient Unable To Answer (07/05/2023)  Utilities: Patient Unable To Answer (07/05/2023)  Depression (PHQ2-9): Medium Risk (06/26/2023)  Financial Resource Strain: Low Risk  (06/26/2023)  Physical Activity: Insufficiently Active (06/26/2023)  Social Connections: Socially Isolated (06/26/2023)  Stress: No Stress Concern Present (06/26/2023)  Tobacco Use: Medium Risk (07/05/2023)  Health Literacy: Adequate Health Literacy (06/26/2023)    Readmission Risk Interventions     No data to display

## 2023-07-17 NOTE — Plan of Care (Signed)
  Problem: Metabolic: Goal: Ability to maintain appropriate glucose levels will improve Outcome: Progressing   Problem: Nutritional: Goal: Maintenance of adequate nutrition will improve Outcome: Progressing   Problem: Skin Integrity: Goal: Risk for impaired skin integrity will decrease Outcome: Progressing   Problem: Clinical Measurements: Goal: Respiratory complications will improve Outcome: Progressing Goal: Cardiovascular complication will be avoided Outcome: Progressing   Problem: Activity: Goal: Risk for activity intolerance will decrease Outcome: Progressing   Problem: Skin Integrity: Goal: Risk for impaired skin integrity will decrease Outcome: Progressing

## 2023-07-17 NOTE — Progress Notes (Signed)
Physical Therapy Treatment Patient Details Name: Michelle [name] MRN: [id number] DOB: May 03, 1933 Today's Date: 07/17/2023   History of Present Illness 89 yo female presenting with AMS and seizures on 07/05/23. CT head showing Small volume subarachnoid hemorrhage in the right frontal lobe. Seizure meds and BP meds being adjusted.  PMH including dementia, hypertension, hyperlipidemia, diabetes, COPD, CKD 3, CAD status post stent, epilepsy, diastolic CHF, anxiety, anemia.    PT Comments  Pt resting in bed on arrival and agreeable to session with encouragement as pt with increased confusion this date, stating she is not a patient but visiting someone. Attempted to re-orient pt however unable. Pt requiring min A to complete bed mobility and mod A to boost to stand x3 throughout session. Pt with flexed trunk and posterior lean in standing needing increased cues and assist to shift weight anterior and mod A to step over to chair with RW support. Pt unable to progress gait due to fatigue. Pt performing seated LE exercises with fait tolerance. Pt up in chair at end of session with breakfast tray set up. Current plan remains appropriate to address deficits and maximize functional independence and decrease caregiver burden. Pt continues to benefit from skilled PT services to progress toward functional mobility goals.     If plan is discharge home, recommend the following: Supervision due to cognitive status;Assist for transportation;Assistance with cooking/housework;Help with stairs or ramp for entrance;Direct supervision/assist for medications management;Direct supervision/assist for financial management;Two people to help with walking and/or transfers   Can travel by private vehicle     No  Equipment Recommendations  None recommended by PT    Recommendations for Other Services       Precautions / Restrictions Precautions Precautions: Fall Restrictions Weight Bearing Restrictions Per Provider Order:  No     Mobility  Bed Mobility Overal bed mobility: Needs Assistance Bed Mobility: Supine to Sit     Supine to sit: Min assist     General bed mobility comments: able to get to EOB with min assist, increased time, and verbal cues    Transfers Overall transfer level: Needs assistance Equipment used: Rolling walker (2 wheels) Transfers: Sit to/from Stand, Bed to chair/wheelchair/BSC Sit to Stand: Mod assist   Step pivot transfers: Mod assist       General transfer comment: mod A to stand from EOB x2 and recliner x1, asssit needed to boost from sitting surface, mod A to steady and guide hips around to chair    Ambulation/Gait               General Gait Details: pt unable to take steps due to fatigue despite max encouragement   Stairs             Wheelchair Mobility     Tilt Bed    Modified Rankin (Stroke Patients Only)       Balance Overall balance assessment: Needs assistance Sitting-balance support: No upper extremity supported, Feet supported Sitting balance-Leahy Scale: Poor Sitting balance - Comments: supervision for sitting balance, some posterior lean initially Postural control: Right lateral lean, Posterior lean Standing balance support: Bilateral upper extremity supported Standing balance-Leahy Scale: Poor Standing balance comment: Needs RW                            Cognition Arousal: Alert Behavior During Therapy: WFL for tasks assessed/performed Overall Cognitive Status: Impaired/Different from baseline Area of Impairment: Memory, Following commands, Safety/judgement, Awareness, Problem solving, Orientation, Attention  Orientation Level: Time, Situation, Place (stating she is not a patient here and just here visiting someone) Current Attention Level: Sustained Memory: Decreased short-term memory Following Commands: Follows one step commands consistently, Follows multi-step commands inconsistently,  Follows one step commands with increased time Safety/Judgement: Decreased awareness of safety, Decreased awareness of deficits Awareness: Emergent Problem Solving: Slow processing, Requires tactile cues, Requires verbal cues, Decreased initiation General Comments: alert and able to follow directions with increased time, increased confusion this session stating she is not a patient and is just here visiting someone, unable to be re-oriented        Exercises General Exercises - Lower Extremity Long Arc Quad: AROM, Both, 20 reps, Seated Hip Flexion/Marching: AROM, Both, 20 reps    General Comments        Pertinent Vitals/Pain Pain Assessment Pain Assessment: Faces Faces Pain Scale: Hurts little more Pain Location: gneralized with mobility Pain Descriptors / Indicators: Grimacing, Guarding Pain Intervention(s): Monitored during session, Limited activity within patient's tolerance, Repositioned    Home Living                          Prior Function            PT Goals (current goals can now be found in the care plan section) Acute Rehab PT Goals Patient Stated Goal: return home PT Goal Formulation: With patient/family Time For Goal Achievement: 07/21/23 Progress towards PT goals: Progressing toward goals (slowly)    Frequency    Min 1X/week      PT Plan      Co-evaluation              AM-PAC PT "6 Clicks" Mobility   Outcome Measure  Help needed turning from your back to your side while in a flat bed without using bedrails?: A Lot Help needed moving from lying on your back to sitting on the side of a flat bed without using bedrails?: A Lot Help needed moving to and from a bed to a chair (including a wheelchair)?: A Lot Help needed standing up from a chair using your arms (e.g., wheelchair or bedside chair)?: A Lot Help needed to walk in hospital room?: A Lot Help needed climbing 3-5 steps with a railing? : Total 6 Click Score: 11    End of  Session Equipment Utilized During Treatment: Gait belt Activity Tolerance: Patient limited by fatigue Patient left: in chair;with call bell/phone within reach;with chair alarm set Nurse Communication: Mobility status PT Visit Diagnosis: Other abnormalities of gait and mobility (R26.89);Muscle weakness (generalized) (M62.81);Other symptoms and signs involving the nervous system (M57.846)     Time: 9629-5284 PT Time Calculation (min) (ACUTE ONLY): 19 min  Charges:    $Therapeutic Activity: 8-22 mins PT General Charges $$ ACUTE PT VISIT: 1 Visit                     Daymein Nunnery R. PTA Acute Rehabilitation Services Office: [phone]   Catalina Antigua 07/17/2023, 9:27 AM

## 2023-07-17 NOTE — Progress Notes (Addendum)
Inpatient Rehab Admissions Coordinator:   Addendum- Spoke with sons and they would like to go ahead with appeal. I will send updated clinicals to insurance to begin appeal process.   Received denial from [facility] via peer to peer for CIR. Other discharge dispositions will need to be explored. Signing off.   Rehab Admissons Coordinator [place], [place], [place] [phone]

## 2023-07-17 NOTE — Progress Notes (Signed)
Progress Note   Patient: Michelle [name] GUR:[id number] DOB: 01/16/1933 DOA: 07/05/2023     11 DOS: the patient was seen and examined on 07/17/2023   Brief hospital course: 89 y.o. female with medical history significant of dementia largely wheelchair-bound, hypertension, hyperlipidemia, diabetes, COPD, CKD 3, CAD status post stent, epilepsy, diastolic CHF, anxiety, anemia, wheelchair-bound presenting with altered mental status and seizure-family found her.  With active seizure lasting couple of minutes resolving spontaneously. Patient has known history of dementia and seizure disorder with recent suspected breakthrough seizures that lead to admissions in October and November of this year. ED Course: Vitals stable with mild tachycardia tachypnea, labs CMP with bicarb 20, BUN 26, glucose 168.  CBC within normal limits.  Urinalysis with protein, leukocytes, bacteria.  Urine culture SENT CXR>mild vascular congestion CT head>small volume subarachnoid hemorrhage at the right frontal lobe CT C-spine> no acute normality CTA of the head and neck> motion degraded for both but showed no large vessel occlusion or significant stenosis of the head nor no hemodynamically significant stenosis of the neck within the motion degraded limitation. Neurology was consulted , given ceftriaxone,Keppra Ativan and admitted for further management of altered mental status with breakthrough seizure and small right frontal SAH concerning for possible amyloid etiology  LTM EEG no seizure, slow waves. Suspect breakthrough seizure secondary to acute toxic metabolic encephalopathy compounded by history of dementia, seen by neurology-placed on Keppra-on follow-up outpatient provider could consider increasing her Lamictal and wean off Keppra as outpatient.BP medication adjusted to optimize blood pressure for goal of 130-150. Pt is awaiting acceptance into CIR.   Assessment and Plan: Breakthrough seizure and chronic dementia  -  Lamictal 50 mg PO bid  - Keppra 250 mg PO q12  - Pamelor 10 mg PO daily  - Seroquel 12.5 mg PO at bedtime  - Zoloft 100 mg PO at bedtime   Small R frontal SAH - Seen on arrival.  Concerning for possible amyloid etiology - Workup with CTA head and neck - Seen by neurology - Avoid anticoagulants  HTN  - Norvasc 2.5 mg PO daily  - Coreg 12.5 mg PO bid  - Vasotec 20 mg PO daily  - Hydralazine 10 mg PO tid   Klebsiella UTI  - Completed antibx's  CAD - Crestor 40 mg PO daily  - Fenofibrate 54 mg PO daily   Ambulatory dysfunction w/ severe deconditioning - Had Peer to peer on 07/17/2023 and they denies CIR & said that the pt is more SNF appropriate due to her current cognition    DM - Novolog SS q4 hr       Subjective: Pt seen and examined at the bedside. Had a peer to peer for CIR authorization today (07/17/2023). They denied CIR and advised that the pt is more SNF appropriate. CM was made aware to start looking for SNF's.  Spoke with the neurology team on 07/17/2023 and they advised to slowly taper the pt's keppra off (as family is concerned that the keppra is making the pt drowsy). Thus, pt is now on 250 mg PO q12 of keppra. Keppra will be discontinued 07/18/2023.  Physical Exam: Vitals:   07/16/23 2334 07/17/23 0407 07/17/23 0742 07/17/23 1126  BP: (!) 152/62 (!) 169/73 (!) 159/59   Pulse: 68 75 75 76  Resp: 18 18 17 16   Temp: 97.9 F (36.6 C) 97.8 F (36.6 C) 98.6 F (37 C)   TempSrc: Oral Oral Oral   SpO2: 100% 100% 99% 95%  Weight:  Height:       Physical Exam HENT:     Mouth/Throat:     Mouth: Mucous membranes are moist.  Cardiovascular:     Rate and Rhythm: Normal rate and regular rhythm.  Pulmonary:     Effort: Pulmonary effort is normal.  Abdominal:     Palpations: Abdomen is soft.  Skin:    General: Skin is warm.  Neurological:     Mental Status: She is alert. Mental status is at baseline.  Psychiatric:        Mood and Affect: Mood normal.         Disposition: Status is: Inpatient Remains inpatient appropriate because: Awaiting safe disposition   Planned Discharge Destination: Skilled nursing facility    Time spent: 35 minutes  Author: Baron Hamper , MD 07/17/2023 11:34 AM  For on call review [email or web address].

## 2023-07-18 DIAGNOSIS — I10 Essential (primary) hypertension: Secondary | ICD-10-CM | POA: Diagnosis not present

## 2023-07-18 DIAGNOSIS — I609 Nontraumatic subarachnoid hemorrhage, unspecified: Secondary | ICD-10-CM | POA: Diagnosis not present

## 2023-07-18 DIAGNOSIS — F411 Generalized anxiety disorder: Secondary | ICD-10-CM | POA: Diagnosis not present

## 2023-07-18 LAB — GLUCOSE, CAPILLARY
Glucose-Capillary: 104 mg/dL — ABNORMAL HIGH (ref 70–99)
Glucose-Capillary: 132 mg/dL — ABNORMAL HIGH (ref 70–99)
Glucose-Capillary: 139 mg/dL — ABNORMAL HIGH (ref 70–99)
Glucose-Capillary: 170 mg/dL — ABNORMAL HIGH (ref 70–99)
Glucose-Capillary: 158 mg/dL — ABNORMAL HIGH (ref 70–99)
Glucose-Capillary: 203 mg/dL — ABNORMAL HIGH (ref 70–99)

## 2023-07-18 NOTE — Progress Notes (Signed)
 PROGRESS NOTE    [name]  FMW:[id number] DOB: 09-06-1932 DOA: 07/05/2023 PCP: Katrinka Garnette KIDD, MD  Chief Complaint  Patient presents with   Seizures   Altered Mental Status    Hospital Course:  Michelle [name] is 89 y.o. female with dementia, wheelchair-bound at baseline, hypertension, hyperlipidemia, diabetes, seizure disorder, COPD, CKD stage III, CAD status post stenting, epilepsy, diastolic heart failure, anxiety, anemia, who presented with altered mental status and seizure.  Patient was recently admitted in October or November for breakthrough seizures.  In the ED she was found to be tachycardic, tachypneic, UA consistent with infection.  Head CT revealed small volume subarachnoid hemorrhage in the right frontal lobe.  Neurology was consulted.  Patient was started on Keppra  with plans to eventually proceed with AED titration outpatient.  She underwent EEG which revealed no seizure.  It was suspected that her breakthrough seizure was secondary to UTI.  Patient completed 5 days of antibiotics.  Stay has been prolonged due to discharge planning.  There is initially discussion to discharge home with hospice however given patient's severe deconditioning, family has concerns that they are unable to manage her needs at home.  Physical therapy recommended inpatient rehab.  This was applied for but denied by insurance.  Currently under the process of appealing this denial.  Subjective: No acute events overnight. On evaluation today patient is more alert than days prior according to her son. Pt has no acute complaints   Objective: Vitals:   07/17/23 2353 07/18/23 0421 07/18/23 0700 07/18/23 0906  BP: 124/61 (!) 127/54 (!) 152/102   Pulse: 72 69 69 71  Resp: 16 17 18 18   Temp: 98.6 F (37 C) 98 F (36.7 C) 98.6 F (37 C)   TempSrc: Oral  Axillary   SpO2: 100% 94% 97% 95%  Weight:      Height:        Intake/Output Summary (Last 24 hours) at 07/18/2023 0913 Last data filed at  07/18/2023 0600 Gross per 24 hour  Intake 150 ml  Output 600 ml  Net -450 ml   Filed Weights   07/05/23 0905  Weight: 75.8 kg    Examination: General exam: Appears calm and comfortable, NAD  Respiratory system: No work of breathing, symmetric chest wall expansion Cardiovascular system: S1 & S2 heard, RRR.  Gastrointestinal system: Abdomen is nondistended, soft and nontender.  Neuro: Alert and oriented. No focal neurological deficits. Extremities: Symmetric, expected ROM Skin: No rashes, lesions Psychiatry: Demonstrates appropriate judgement and insight. Mood & affect appropriate for situation.   Assessment & Plan:  Active Problems:   Type 2 diabetes mellitus without complication, with long-term current use of insulin  (HCC)   Hyperlipidemia associated with type 2 diabetes mellitus (HCC)   Essential hypertension   COPD (chronic obstructive pulmonary disease) (HCC)   Chronic diastolic CHF (congestive heart failure) (HCC)   CAD- RCA PCI '90s, STEMI-RCA DES 02/18/14   Anxiety state   CKD (chronic kidney disease), stage III (HCC)   Anemia, iron deficiency   Dementia (HCC)   Epilepsy (HCC)   DNR (do not resuscitate)/DNI(Do Not Intubate)   Subarachnoid hemorrhage (HCC)   Seizure (HCC)   Seizures (HCC)  Breakthrough seizure, superimposed on known seizure disorder Acute toxic metabolic encephalopathy, superimposed on chronic dementia Anxiety disorder - Breakthrough seizure appears to be secondary to UTI - Mental status appears to be improving to baseline - EEG without epileptiform activity - Neurology consulted, AED titration per neuro. - Patient's drowsiness has gotten  worse since Keppra  addition, have decreased to 250 twice daily for now (neurology recs to prior hospitalist) if she remains stable on this dose consider complete discontinuation. - Outpatient follow-up with neurology for increasing Lamictal  and tapering off Keppra  as they see fit - Continue home dose Seroquel  -  Continue PT/OT, potential DC to CIR  Drowsiness -- Mentation continues to wax and wane, now improved since Keppra  has been decreased.  Consider complete discontinuation of Keppra  if ok with Neuro -- multifactorial given breakthrough seizures and dementia.   Small right frontal subarachnoid hemorrhage - Seen on arrival.  Concerning for possible amyloid etiology - Workup with CTA head and neck: no LVO - Seen by neurology - Avoid anticoagulants   Hypertension - Blood pressures have been low to normotensive.  Continue to titrate medications as tolerated - Needs close outpatient follow-up with PCP and blood pressure monitoring   Klebsiella UTI - Status posttreatment   CAD Hyperlipidemia - STEMI status post stent 2015 - Antiplatelets held secondary to subarachnoid hemorrhage - Neurology has cleared the patient to resume 81 as monotherapy and recommended to discontinue Plavix  - Has been discussed with the patient's son at bedside   Chronic diastolic heart failure - EF 60 to 65%, on Lasix  as needed - Continue monitor - GDMT as BP tolerates   COPD, not in exacerbation - Continue home inhaler - On room air   Ambulatory dysfunction - Wheelchair-bound at baseline - PT/OT as above - Likely discharge to SNF/CIR   Goals of care - Currently DNR, family requesting intubation if required procedures. - Palliative care consulted for ongoing discussions   Diabetes - Continue sliding scale insulin , titrate up as tolerated   Endometrial mass - Known from prior to arrival - Followed by Gyn Onc, patient has previously denied surgical intervention or endometrial biopsy - Has intermittent vaginal bleeding, appears well-controlled with Provera    Severe deconditioning - Prior to this hospitalization patient was able to assist with transfers at home.  She is primarily cared for by her adult son who also has disabilities - Patient was denied for CIR by insurance.  Peer-to-peer completed 12/30.   Family currently appealing.  Functional status is improving with her increasing alertness.       DVT prophylaxis: SCDs   Code Status: Do not attempt resuscitation (DNR) PRE-ARREST INTERVENTIONS DESIRED Family Communication: Son at bedside Disposition:  Status is: Inpatient     Consultants:    Neurology  Procedures:  N/a  Antimicrobials:  Anti-infectives (From admission, onward)    Start     Dose/Rate Route Frequency Ordered Stop   07/06/23 1200  cefTRIAXone  (ROCEPHIN ) 1 g in sodium chloride  0.9 % 100 mL IVPB  Status:  Discontinued        1 g 200 mL/hr over 30 Minutes Intravenous Every 24 hours 07/05/23 1427 07/11/23 1148   07/05/23 1230  cefTRIAXone  (ROCEPHIN ) 1 g in sodium chloride  0.9 % 100 mL IVPB        1 g 200 mL/hr over 30 Minutes Intravenous  Once 07/05/23 1216 07/05/23 1324       Data Reviewed: I have personally reviewed following labs and imaging studies CBC: Recent Labs  Lab 07/12/23 0855 07/14/23 0624 07/15/23 0428 07/16/23 0445 07/17/23 0621  WBC 6.4 7.2 6.7 6.9 8.1  NEUTROABS 3.6 4.2 3.8 3.6 4.5  HGB 13.1 12.6 12.8 12.2 13.6  HCT 40.5 40.1 39.9 38.4 42.3  MCV 84.4 85.5 84.7 85.5 84.3  PLT 273 240 244 255 247  Basic Metabolic Panel: Recent Labs  Lab 07/12/23 0855 07/14/23 0624 07/15/23 0428 07/16/23 0445 07/17/23 0621  NA 134* 137 137 137 139  K 4.4 4.3 4.2 4.4 4.3  CL 105 103 105 106 104  CO2 23 25 21* 23 24  GLUCOSE 109* 108* 123* 107* 121*  BUN 26* 36* 30* 32* 28*  CREATININE 0.73 0.98 0.87 0.90 0.93  CALCIUM  9.5 9.7 9.8 9.7 10.2   GFR: Estimated Creatinine Clearance: 42.6 mL/min (by C-G formula based on SCr of 0.93 mg/dL). Liver Function Tests: Recent Labs  Lab 07/12/23 0855  AST 26  ALT 23  ALKPHOS 49  BILITOT 0.5  PROT 7.0  ALBUMIN 3.4*   CBG: Recent Labs  Lab 07/17/23 1619 07/17/23 2026 07/17/23 2352 07/18/23 0422 07/18/23 0744  GLUCAP 135* 144* 139* 104* 132*    No results found for this or any previous  visit (from the past 240 hours).   Radiology Studies: No results found.  Scheduled Meds:  amLODipine   2.5 mg Oral Daily   carvedilol   12.5 mg Oral BID   enalapril   20 mg Oral QHS   fenofibrate   54 mg Oral Daily   hydrALAZINE   10 mg Oral TID   icosapent  Ethyl  1 g Oral BID WC   And   icosapent  Ethyl  2 g Oral QHS   insulin  aspart  0-15 Units Subcutaneous Q4H   lamoTRIgine   50 mg Oral BID   levETIRAcetam   250 mg Oral Q12H   medroxyPROGESTERone   10 mg Oral Daily   mometasone -formoterol   2 puff Inhalation BID   nortriptyline   10 mg Oral QHS   pantoprazole   40 mg Oral Daily   QUEtiapine   12.5 mg Oral QPM   rosuvastatin   40 mg Oral Daily   sertraline   100 mg Oral QHS   sodium chloride  flush  3 mL Intravenous Q12H   umeclidinium bromide   1 puff Inhalation Daily   Continuous Infusions:   LOS: 12 days    Time spent in total reviewing chart, coordinating care, evaluating pt:  55min  Konstantina Nachreiner, DO [facility]  To contact the attending physician between 7A-7P please use Epic Chat. To contact the covering physician during after hours 7P-7A, please review Amion.   07/18/2023, 9:13 AM   *This document has been created with the assistance of dictation software. Please excuse typographical errors. *

## 2023-07-18 NOTE — Progress Notes (Signed)
 Inpatient Rehab Admissions Coordinator:   Appeal has been sent to [facility] for review with updated clinicals. Will await decision. Continue to follow.   Rehab Admissons Coordinator [place], [place], [place] [phone]

## 2023-07-18 NOTE — Progress Notes (Signed)
   Palliative Medicine Inpatient Follow Up Note HPI: Michelle [name] is 89 y.o. female with dementia, wheelchair-bound at baseline, hypertension, hyperlipidemia, diabetes, seizure disorder, COPD, CKD stage III, CAD status post stenting, epilepsy, diastolic CHF, anxiety, anemia, who presented with altered mental status and seizure, found by family.    Palliative care has been asked to get involved for further goals of care conversations.    Today's Discussion 07/18/2023  *Please note that this is a verbal dictation therefore any spelling or grammatical errors are due to the Dragon Medical One system interpretation.  Chart reviewed inclusive of vital signs, progress notes, laboratory results, and diagnostic images.  I met with Michelle [name]'s RN this morning, Habib. He shares no significant concerns.  Michelle [name] is lying in bed on assessment, denies pain, SOB, and nausea.  Plan at this time is for family to appeal [employer] decision on CIR.  Questions and concerns addressed/Palliative Support Provided.   Objective Assessment: Vital Signs Vitals:   07/18/23 0700 07/18/23 0906  BP: (!) 152/102   Pulse: 69 71  Resp: 18 18  Temp: 98.6 F (37 C)   SpO2: 97% 95%    Intake/Output Summary (Last 24 hours) at 07/18/2023 1051 Last data filed at 07/18/2023 0600 Gross per 24 hour  Intake 150 ml  Output 600 ml  Net -450 ml   Last Weight  Most recent update: 07/05/2023  9:05 AM    Weight  75.8 kg (167 lb 1.7 oz)            Gen:  Elderly Caucasian F in NAD HEENT: moist mucous membranes CV: Regular rate and rhythm  PULM:  On RA, breathing is even and nonlabored ABD: soft/nontender  EXT: No edema  Neuro: Alert and oriented to self  SUMMARY OF RECOMMENDATIONS   DNAR/DNI   Appreciate TOC helping with placement -->Denied from CIR family is appealing   Ongoing incremental PMT support  MDM:  Low ______________________________________________________________________________________ Michelle [name] [place] Palliative Medicine Team Team Cell Phone: [phone] Please utilize secure chat with additional questions, if there is no response within 30 minutes please call the above phone number  Palliative Medicine Team providers are available by phone from 7am to 7pm daily and can be reached through the team cell phone.  Should this patient require assistance outside of these hours, please call the patient's attending physician.

## 2023-07-18 NOTE — Progress Notes (Signed)
 Occupational Therapy Treatment Patient Details Name: Michelle [name] MRN: [id number] DOB: 12-Oct-1932 Today's Date: 07/18/2023   History of present illness 89 yo female presenting with AMS and seizures on 07/05/23. CT head showing Small volume subarachnoid hemorrhage in the right frontal lobe. Seizure meds and BP meds being adjusted.  PMH including dementia, hypertension, hyperlipidemia, diabetes, COPD, CKD 3, CAD status post stent, epilepsy, diastolic CHF, anxiety, anemia.   OT comments  Pt progressing toward goals this session, needing mod +2 for standing attempts and bed mobility. Pt alert initially, but with mobility noted to become more lethargic, needing cues to keep eyes open and incr time for command follow during session. Pt able to perform grooming task at sink, stood to turn on water  and wet washcloth but needed to sit to wash face. Pt presenting with impairments listed below, will follow acutely. Patient will benefit from intensive inpatient follow up therapy, >3 hours/day to maximize safety/ind with ADL/functional mobility.       If plan is discharge home, recommend the following:  A lot of help with walking and/or transfers;A lot of help with bathing/dressing/bathroom;Assistance with cooking/housework;Direct supervision/assist for medications management;Direct supervision/assist for financial management;Assist for transportation;Help with stairs or ramp for entrance   Equipment Recommendations  None recommended by OT    Recommendations for Other Services      Precautions / Restrictions Precautions Precautions: Fall Restrictions Weight Bearing Restrictions Per Provider Order: No       Mobility Bed Mobility Overal bed mobility: Needs Assistance Bed Mobility: Supine to Sit, Sit to Supine     Supine to sit: Mod assist, +2 for physical assistance     General bed mobility comments: mod cues and physical assist to scoot to EOB, assist to reach arm across     Transfers Overall transfer level: Needs assistance Equipment used: Rolling walker (2 wheels) Transfers: Sit to/from Stand, Bed to chair/wheelchair/BSC Sit to Stand: Mod assist, +2 physical assistance     Step pivot transfers: Mod assist, +2 physical assistance           Balance Overall balance assessment: Needs assistance Sitting-balance support: No upper extremity supported, Feet supported Sitting balance-Leahy Scale: Fair Sitting balance - Comments: supervision for sitting balance, some posterior lean initially Postural control: Right lateral lean, Posterior lean Standing balance support: Bilateral upper extremity supported Standing balance-Leahy Scale: Poor Standing balance comment: Needs RW                           ADL either performed or assessed with clinical judgement   ADL Overall ADL's : Needs assistance/impaired     Grooming: Moderate assistance Grooming Details (indicate cue type and reason): starting facewashing task standing at sink, needed to sit halfway through                 Toilet Transfer: Moderate assistance;+2 for physical assistance Toilet Transfer Details (indicate cue type and reason): frequent cues to stand upright         Functional mobility during ADLs: Moderate assistance;+2 for physical assistance      Extremity/Trunk Assessment Upper Extremity Assessment Upper Extremity Assessment: Generalized weakness   Lower Extremity Assessment Lower Extremity Assessment: Defer to PT evaluation        Vision   Vision Assessment?: No apparent visual deficits;Wears glasses for reading   Perception Perception Perception: Not tested   Praxis Praxis Praxis: Not tested    Cognition Arousal: Alert Behavior During Therapy: [facility] for tasks  assessed/performed Overall Cognitive Status: Impaired/Different from baseline Area of Impairment: Memory, Following commands, Safety/judgement, Awareness, Problem solving, Orientation,  Attention                 Orientation Level: Situation Current Attention Level: Sustained Memory: Decreased short-term memory Following Commands: Follows one step commands consistently, Follows multi-step commands inconsistently, Follows one step commands with increased time Safety/Judgement: Decreased awareness of safety, Decreased awareness of deficits Awareness: Emergent Problem Solving: Slow processing, Requires tactile cues, Requires verbal cues, Decreased initiation General Comments: alert upon arrival, pt drowsy with bed mobility and transfers, more alert at end of session when talking to son        Exercises      Shoulder Instructions       General Comments son present throughout session    Pertinent Vitals/ Pain       Pain Assessment Pain Assessment: Faces Pain Score: 4  Faces Pain Scale: Hurts little more Pain Location: gneralized with mobility Pain Descriptors / Indicators: Grimacing, Guarding Pain Intervention(s): Limited activity within patient's tolerance, Monitored during session, Repositioned  Home Living                                          Prior Functioning/Environment              Frequency  Min 1X/week        Progress Toward Goals  OT Goals(current goals can now be found in the care plan section)  Progress towards OT goals: Progressing toward goals  Acute Rehab OT Goals Patient Stated Goal: none stated OT Goal Formulation: With patient Time For Goal Achievement: 07/21/23 Potential to Achieve Goals: Good ADL Goals Pt Will Perform Grooming: with modified independence;sitting Pt Will Perform Lower Body Bathing: with min assist;sit to/from stand Pt Will Perform Upper Body Dressing: with set-up;with supervision;sitting Pt Will Perform Lower Body Dressing: with min assist;sit to/from stand Pt Will Transfer to Toilet: with min assist;stand pivot transfer;bedside commode Pt Will Perform Toileting - Clothing  Manipulation and hygiene: with contact guard assist;sit to/from stand Additional ADL Goal #1: Pt will perform bed mobility with Min A in preparation for ADLs Additional ADL Goal #2: Pt will follow 100% of 1 step commands to demonstrate improved cognition for ADLs  Plan      Co-evaluation    PT/OT/SLP Co-Evaluation/Treatment: Yes Reason for Co-Treatment: For patient/therapist safety;To address functional/ADL transfers   OT goals addressed during session: ADL's and self-care      AM-PAC OT 6 Clicks Daily Activity     Outcome Measure   Help from another person eating meals?: A Little Help from another person taking care of personal grooming?: A Little Help from another person toileting, which includes using toliet, bedpan, or urinal?: A Lot Help from another person bathing (including washing, rinsing, drying)?: A Lot Help from another person to put on and taking off regular upper body clothing?: A Lot Help from another person to put on and taking off regular lower body clothing?: A Lot 6 Click Score: 14    End of Session Equipment Utilized During Treatment: Gait belt;Rolling walker (2 wheels)  OT Visit Diagnosis: Unsteadiness on feet (R26.81);Other abnormalities of gait and mobility (R26.89);Muscle weakness (generalized) (M62.81) Pain - Right/Left: Right Pain - part of body: Knee   Activity Tolerance Patient tolerated treatment well   Patient Left in chair;with call bell/phone within reach;with chair alarm  set;with family/visitor present   Nurse Communication Mobility status        Time: 1000-1024 OT Time Calculation (min): 24 min  Charges: OT General Charges $OT Visit: 1 Visit OT Treatments $Self Care/Home Management : 8-22 mins  Dajon Lazar K, OTD, OTR/L SecureChat Preferred Acute Rehab [phone]   Michelle [name] 07/18/2023, 10:32 AM

## 2023-07-18 NOTE — Progress Notes (Signed)
 Physical Therapy Treatment Patient Details Name: Michelle [name] MRN: [id number] DOB: June 04, 1933 Today's Date: 07/18/2023   History of Present Illness 89 yo female presenting with AMS and seizures on 07/05/23. CT head showing Small volume subarachnoid hemorrhage in the right frontal lobe. Seizure meds and BP meds being adjusted.  PMH including dementia, hypertension, hyperlipidemia, diabetes, COPD, CKD 3, CAD status post stent, epilepsy, diastolic CHF, anxiety, anemia.    PT Comments  Patient seen in co-treatment to attempt to increase her activity level. Patient alert initially, however became more lethargic with mobility. She was able to stand x 3 with +2 mod assist and remain standing ~60 seconds at a time. Assist for upright posture as she tends to forward flex spine and at hips. Able to stand at sink to attempt ADLs for OT. Unable to progress to ambulation with chair follow due to fatigue/lethargy. Will continue to benefit from PT to incr independence and decr caregiver burden. Son reports MD weaning her off the Keppra  and hopeful her alertness will continue to improve. Was better today than previously.     If plan is discharge home, recommend the following: Supervision due to cognitive status;Assist for transportation;Assistance with cooking/housework;Help with stairs or ramp for entrance;Direct supervision/assist for medications management;Direct supervision/assist for financial management;Two people to help with walking and/or transfers   Can travel by private vehicle     No  Equipment Recommendations  None recommended by PT    Recommendations for Other Services       Precautions / Restrictions Precautions Precautions: Fall Restrictions Weight Bearing Restrictions Per Provider Order: No     Mobility  Bed Mobility Overal bed mobility: Needs Assistance Bed Mobility: Supine to Sit     Supine to sit: Mod assist, +2 for physical assistance     General bed mobility comments:  mod cues and physical assist to scoot to EOB, assist to reach arm across    Transfers Overall transfer level: Needs assistance Equipment used: Rolling walker (2 wheels) Transfers: Sit to/from Stand, Bed to chair/wheelchair/BSC Sit to Stand: Mod assist, +2 physical assistance   Step pivot transfers: Mod assist, +2 physical assistance       General transfer comment: mod A to stand from EOB x2 and recliner x1, asssit needed to boost from sitting surface, mod A to steady and guide hips around to chair    Ambulation/Gait               General Gait Details: pt unable to take steps due to fatigue despite max encouragement and +2 mod assist   Stairs             Wheelchair Mobility     Tilt Bed    Modified Rankin (Stroke Patients Only)       Balance Overall balance assessment: Needs assistance Sitting-balance support: No upper extremity supported, Feet supported Sitting balance-Leahy Scale: Fair Sitting balance - Comments: supervision for sitting balance, some posterior lean initially   Standing balance support: Bilateral upper extremity supported Standing balance-Leahy Scale: Poor Standing balance comment: Needs RW; stood for max of 60 seconds, with fatigue begins to forward flex and try to prop on her forearms on RW with assist for upright posture                            Cognition Arousal: Lethargic, Alert Behavior During Therapy: WFL for tasks assessed/performed Overall Cognitive Status: Impaired/Different from baseline Area of Impairment: Memory, Following commands,  Safety/judgement, Awareness, Problem solving, Orientation, Attention                 Orientation Level: Situation Current Attention Level: Sustained Memory: Decreased short-term memory Following Commands: Follows one step commands consistently, Follows multi-step commands inconsistently, Follows one step commands with increased time Safety/Judgement: Decreased awareness of  safety, Decreased awareness of deficits Awareness: Emergent Problem Solving: Slow processing, Requires tactile cues, Requires verbal cues, Decreased initiation General Comments: alert upon arrival, pt drowsy with bed mobility and transfers, more alert at end of session when talking to son        Exercises General Exercises - Lower Extremity Long Arc Quad: AROM, Both, Seated, 10 reps    General Comments General comments (skin integrity, edema, etc.): Son present. Reports MD has cut her Keppra  in 1/2 and is planning to wean her off. Hopeful this will improve her alertness.      Pertinent Vitals/Pain Pain Assessment Pain Assessment: Faces Faces Pain Scale: Hurts little more Pain Location: generalized with mobility Pain Descriptors / Indicators: Grimacing, Guarding Pain Intervention(s): Limited activity within patient's tolerance, Monitored during session    Home Living                          Prior Function            PT Goals (current goals can now be found in the care plan section) Acute Rehab PT Goals Patient Stated Goal: return home Time For Goal Achievement: 07/21/23 Potential to Achieve Goals: Fair Progress towards PT goals: Progressing toward goals (when more alert)    Frequency    Min 1X/week      PT Plan      Co-evaluation PT/OT/SLP Co-Evaluation/Treatment: Yes Reason for Co-Treatment: For patient/therapist safety;To address functional/ADL transfers PT goals addressed during session: Mobility/safety with mobility;Proper use of DME;Strengthening/ROM OT goals addressed during session: ADL's and self-care      AM-PAC PT 6 Clicks Mobility   Outcome Measure  Help needed turning from your back to your side while in a flat bed without using bedrails?: A Lot Help needed moving from lying on your back to sitting on the side of a flat bed without using bedrails?: Total Help needed moving to and from a bed to a chair (including a wheelchair)?:  Total Help needed standing up from a chair using your arms (e.g., wheelchair or bedside chair)?: Total Help needed to walk in hospital room?: Total Help needed climbing 3-5 steps with a railing? : Total 6 Click Score: 7    End of Session Equipment Utilized During Treatment: Gait belt Activity Tolerance: Patient limited by fatigue Patient left: in chair;with call bell/phone within reach;with chair alarm set;with family/visitor present Nurse Communication: Mobility status PT Visit Diagnosis: Other abnormalities of gait and mobility (R26.89);Muscle weakness (generalized) (M62.81);Other symptoms and signs involving the nervous system (R29.898)     Time: 1000-1025 PT Time Calculation (min) (ACUTE ONLY): 25 min  Charges:    $Gait Training: 8-22 mins PT General Charges $$ ACUTE PT VISIT: 1 Visit                      Macario RAMAN, PT Acute Rehabilitation Services  Office [phone]    Macario SHAUNNA Soja 07/18/2023, 10:45 AM

## 2023-07-18 NOTE — Plan of Care (Signed)
  Problem: Education: Goal: Ability to describe self-care measures that may prevent or decrease complications (Diabetes Survival Skills Education) will improve Outcome: Progressing Goal: Individualized Educational Video(s) Outcome: Progressing   Problem: Coping: Goal: Ability to adjust to condition or change in health will improve Outcome: Progressing   Problem: Fluid Volume: Goal: Ability to maintain a balanced intake and output will improve Outcome: Progressing   Problem: Health Behavior/Discharge Planning: Goal: Ability to identify and utilize available resources and services will improve Outcome: Progressing Goal: Ability to manage health-related needs will improve Outcome: Progressing   Problem: Metabolic: Goal: Ability to maintain appropriate glucose levels will improve Outcome: Progressing   Problem: Nutritional: Goal: Maintenance of adequate nutrition will improve Outcome: Progressing Goal: Progress toward achieving an optimal weight will improve Outcome: Progressing   Problem: Skin Integrity: Goal: Risk for impaired skin integrity will decrease Outcome: Progressing   Problem: Tissue Perfusion: Goal: Adequacy of tissue perfusion will improve Outcome: Progressing   Problem: Education: Goal: Knowledge of General Education information will improve Description: Including pain rating scale, medication(s)/side effects and non-pharmacologic comfort measures Outcome: Progressing   Problem: Health Behavior/Discharge Planning: Goal: Ability to manage health-related needs will improve Outcome: Progressing   Problem: Clinical Measurements: Goal: Ability to maintain clinical measurements within normal limits will improve Outcome: Progressing Goal: Will remain free from infection Outcome: Progressing Goal: Diagnostic test results will improve Outcome: Progressing Goal: Respiratory complications will improve Outcome: Progressing Goal: Cardiovascular complication will  be avoided Outcome: Progressing   Problem: Activity: Goal: Risk for activity intolerance will decrease Outcome: Progressing   Problem: Nutrition: Goal: Adequate nutrition will be maintained Outcome: Progressing   Problem: Coping: Goal: Level of anxiety will decrease Outcome: Progressing   Problem: Elimination: Goal: Will not experience complications related to bowel motility Outcome: Progressing Goal: Will not experience complications related to urinary retention Outcome: Progressing   Problem: Pain Management: Goal: General experience of comfort will improve Outcome: Progressing   Problem: Safety: Goal: Ability to remain free from injury will improve Outcome: Progressing   Problem: Skin Integrity: Goal: Risk for impaired skin integrity will decrease Outcome: Progressing   Problem: Education: Goal: Expressions of having a comfortable level of knowledge regarding the disease process will increase Outcome: Progressing   Problem: Coping: Goal: Ability to adjust to condition or change in health will improve Outcome: Progressing Goal: Ability to identify appropriate support needs will improve Outcome: Progressing   Problem: Health Behavior/Discharge Planning: Goal: Compliance with prescribed medication regimen will improve Outcome: Progressing   Problem: Medication: Goal: Risk for medication side effects will decrease Outcome: Progressing   Problem: Clinical Measurements: Goal: Complications related to the disease process, condition or treatment will be avoided or minimized Outcome: Progressing Goal: Diagnostic test results will improve Outcome: Progressing   Problem: Safety: Goal: Verbalization of understanding the information provided will improve Outcome: Progressing   Problem: Self-Concept: Goal: Level of anxiety will decrease Outcome: Progressing Goal: Ability to verbalize feelings about condition will improve Outcome: Progressing

## 2023-07-19 DIAGNOSIS — R569 Unspecified convulsions: Secondary | ICD-10-CM | POA: Diagnosis not present

## 2023-07-19 DIAGNOSIS — I609 Nontraumatic subarachnoid hemorrhage, unspecified: Secondary | ICD-10-CM | POA: Diagnosis not present

## 2023-07-19 LAB — GLUCOSE, CAPILLARY
Glucose-Capillary: 101 mg/dL — ABNORMAL HIGH (ref 70–99)
Glucose-Capillary: 107 mg/dL — ABNORMAL HIGH (ref 70–99)
Glucose-Capillary: 190 mg/dL — ABNORMAL HIGH (ref 70–99)
Glucose-Capillary: 179 mg/dL — ABNORMAL HIGH (ref 70–99)
Glucose-Capillary: 169 mg/dL — ABNORMAL HIGH (ref 70–99)

## 2023-07-19 NOTE — Plan of Care (Signed)
  Problem: Education: Goal: Ability to describe self-care measures that may prevent or decrease complications (Diabetes Survival Skills Education) will improve Outcome: Progressing Goal: Individualized Educational Video(s) Outcome: Progressing   Problem: Coping: Goal: Ability to adjust to condition or change in health will improve Outcome: Progressing   Problem: Fluid Volume: Goal: Ability to maintain a balanced intake and output will improve Outcome: Progressing   Problem: Health Behavior/Discharge Planning: Goal: Ability to identify and utilize available resources and services will improve Outcome: Progressing Goal: Ability to manage health-related needs will improve Outcome: Progressing   Problem: Metabolic: Goal: Ability to maintain appropriate glucose levels will improve Outcome: Progressing   Problem: Nutritional: Goal: Maintenance of adequate nutrition will improve Outcome: Progressing Goal: Progress toward achieving an optimal weight will improve Outcome: Progressing   Problem: Skin Integrity: Goal: Risk for impaired skin integrity will decrease Outcome: Progressing   Problem: Tissue Perfusion: Goal: Adequacy of tissue perfusion will improve Outcome: Progressing   Problem: Education: Goal: Knowledge of General Education information will improve Description: Including pain rating scale, medication(s)/side effects and non-pharmacologic comfort measures Outcome: Progressing   Problem: Health Behavior/Discharge Planning: Goal: Ability to manage health-related needs will improve Outcome: Progressing   Problem: Clinical Measurements: Goal: Ability to maintain clinical measurements within normal limits will improve Outcome: Progressing Goal: Will remain free from infection Outcome: Progressing Goal: Diagnostic test results will improve Outcome: Progressing Goal: Respiratory complications will improve Outcome: Progressing Goal: Cardiovascular complication will  be avoided Outcome: Progressing   Problem: Activity: Goal: Risk for activity intolerance will decrease Outcome: Progressing   Problem: Nutrition: Goal: Adequate nutrition will be maintained Outcome: Progressing   Problem: Coping: Goal: Level of anxiety will decrease Outcome: Progressing   Problem: Elimination: Goal: Will not experience complications related to bowel motility Outcome: Progressing Goal: Will not experience complications related to urinary retention Outcome: Progressing   Problem: Pain Management: Goal: General experience of comfort will improve Outcome: Progressing   Problem: Safety: Goal: Ability to remain free from injury will improve Outcome: Progressing   Problem: Skin Integrity: Goal: Risk for impaired skin integrity will decrease Outcome: Progressing   Problem: Education: Goal: Expressions of having a comfortable level of knowledge regarding the disease process will increase Outcome: Progressing   Problem: Coping: Goal: Ability to adjust to condition or change in health will improve Outcome: Progressing Goal: Ability to identify appropriate support needs will improve Outcome: Progressing   Problem: Health Behavior/Discharge Planning: Goal: Compliance with prescribed medication regimen will improve Outcome: Progressing   Problem: Medication: Goal: Risk for medication side effects will decrease Outcome: Progressing   Problem: Clinical Measurements: Goal: Complications related to the disease process, condition or treatment will be avoided or minimized Outcome: Progressing Goal: Diagnostic test results will improve Outcome: Progressing   Problem: Safety: Goal: Verbalization of understanding the information provided will improve Outcome: Progressing   Problem: Self-Concept: Goal: Level of anxiety will decrease Outcome: Progressing Goal: Ability to verbalize feelings about condition will improve Outcome: Progressing

## 2023-07-19 NOTE — Progress Notes (Signed)
   Palliative Medicine Inpatient Follow Up Note HPI: Michelle [name] is 89 y.o. female with dementia, wheelchair-bound at baseline, hypertension, hyperlipidemia, diabetes, seizure disorder, COPD, CKD stage III, CAD status post stenting, epilepsy, diastolic CHF, anxiety, anemia, who presented with altered mental status and seizure, found by family.    Palliative care has been asked to get involved for further goals of care conversations.    Today's Discussion 07/19/2023  *Please note that this is a verbal dictation therefore any spelling or grammatical errors are due to the Dragon Medical One system interpretation.  Chart reviewed inclusive of vital signs, progress notes, laboratory results, and diagnostic images.  I met with Maki's RN, Habib this morning. He shares that he has no concerns for [name]. He reviews that she did get up with physical therapy yesterday though her knees were quite weak when trying to get her back into bed. A steady was needed.   I met with Khadijatou at bedside this morning. She is arousable and denies pain, shortness of breath, or nausea. She endorses no significant concerns this morning.  Plan remains for [facility] appeal for CIR.   Questions and concerns addressed/Palliative Support Provided.   Objective Assessment: Vital Signs Vitals:   07/19/23 0339 07/19/23 0737  BP: 103/78 131/64  Pulse: 67 64  Resp: 18 18  Temp: 97.8 F (36.6 C) 97.8 F (36.6 C)  SpO2: 100% 100%   No intake or output data in the 24 hours ending 07/19/23 0800  Last Weight  Most recent update: 07/05/2023  9:05 AM    Weight  75.8 kg (167 lb 1.7 oz)            Gen:  Elderly Caucasian F in NAD HEENT: moist mucous membranes CV: Regular rate and rhythm  PULM:  On RA, breathing is even and nonlabored ABD: soft/nontender  EXT: No edema  Neuro: Alert and oriented to self  SUMMARY OF RECOMMENDATIONS   DNAR/DNI   Appreciate TOC helping with placement -->CIR appeal  submitted   Ongoing incremental PMT support  MDM: Low ______________________________________________________________________________________ [name] [place] Palliative Medicine Team Team Cell Phone: [phone] Please utilize secure chat with additional questions, if there is no response within 30 minutes please call the above phone number  Palliative Medicine Team providers are available by phone from 7am to 7pm daily and can be reached through the team cell phone.  Should this patient require assistance outside of these hours, please call the patient's attending physician.

## 2023-07-19 NOTE — Progress Notes (Signed)
 PROGRESS NOTE    Michelle [name]  FMW:[id number] DOB: 12-Dec-1932 DOA: 07/05/2023 PCP: Katrinka Garnette KIDD, MD    Brief Narrative:  Michelle [name] is 89 y.o. female with dementia, hypertension, hyperlipidemia, diabetes, seizure disorder, COPD, CKD stage III, CAD status post stenting, epilepsy, diastolic heart failure, anxiety, anemia, who presented with altered mental status and seizure.  Patient was recently admitted in October and November for breakthrough seizures.  In the ED she was found to be tachycardic, tachypneic, UA consistent with infection.  Head CT revealed small volume subarachnoid hemorrhage in the right frontal lobe.  Neurology was consulted.  Patient was started on Keppra  with plans to eventually proceed with AED titration outpatient.  She underwent EEG which revealed no seizure.  It was suspected that her breakthrough seizure was secondary to UTI.  Patient completed 5 days of antibiotics.  Stay has been prolonged due to discharge planning.  There is initially discussion to discharge home with hospice however given patient's severe deconditioning, family has concerns that they are unable to manage her needs at home.  Physical therapy recommended inpatient rehab.  This was applied for but denied by insurance.  Currently under the process of appealing this denial.   Subjective: Patient seen and examined.  Sleepy.  No overnight events.  Wakes up to voice but unable to keep up and conversation.  No other overnight events as per nursing staff.   Assessment & Plan: Breakthrough seizure, superimposed on known seizure disorder Acute toxic metabolic encephalopathy, superimposed on chronic dementia Anxiety disorder - Breakthrough seizure appears to be secondary to UTI - Mental status appears to be improving to baseline - EEG without epileptiform activity - Neurology consulted, AED titration per neuro. - Patient's drowsiness has gotten worse since Keppra  addition, tapered off and  discontinued.  Remains on Lamictal  and Vascepa .  - Continue home dose Seroquel  - Continue PT/OT, potential DC to CIR   Drowsiness -- Mentation continues to wax and wane, now improved since Keppra  has been discontinued.   -- multifactorial given breakthrough seizures and dementia.    Small right frontal subarachnoid hemorrhage - Seen on arrival.  Concerning for possible amyloid etiology - Workup with CTA head and neck: no LVO - Seen by neurology - Avoid anticoagulants   Hypertension - Blood pressures have been low to normotensive.  Continue to titrate medications as tolerated - Needs close outpatient follow-up with PCP and blood pressure monitoring   Klebsiella UTI - Completed antibiotics.   CAD Hyperlipidemia - STEMI status post stent 2015 - Antiplatelets held secondary to subarachnoid hemorrhage - Neurology has cleared the patient to resume 81 as monotherapy and recommended to discontinue Plavix  - Has been discussed with the patient's son at bedside by previous hospitalist.   Chronic diastolic heart failure - EF 60 to 65%, on Lasix  as needed - Continue monitor - GDMT as BP tolerates   COPD, not in exacerbation - Continue home inhaler - On room air   Ambulatory dysfunction - Wheelchair-bound at baseline - PT/OT as above - Likely discharge to SNF/CIR   Goals of care - Currently DNR, family requesting intubation if required procedures. - Palliative care consulted for ongoing discussions   Diabetes - Continue sliding scale insulin , titrate up as tolerated   Endometrial mass - Known from prior to arrival - Followed by Gyn Onc, patient has previously denied surgical intervention or endometrial biopsy - Has intermittent vaginal bleeding, appears well-controlled with Provera    Severe deconditioning - Prior to this hospitalization patient was  able to assist with transfers at home.  She is primarily cared for by her adult son who also has disabilities - Patient was  denied for CIR by insurance.  Peer-to-peer completed 12/30.  Family currently appealing.  Functional status is improving with her increasing alertness  DVT prophylaxis: Place and maintain sequential compression device Start: 07/18/23 1624 Place and maintain sequential compression device Start: 07/12/23 1405 SCDs Start: 07/05/23 1423   Code Status: DNR with full intervention Family Communication: None at the bedside Disposition Plan: Status is: Inpatient Remains inpatient appropriate because: Waiting to go to CIR.     Consultants:  Neurology  Procedures:  EEG  Antimicrobials:  Completed     Objective: Vitals:   07/18/23 1938 07/18/23 2305 07/19/23 0339 07/19/23 0737  BP: 118/60 (!) 121/47 103/78 131/64  Pulse: 72 72 67 64  Resp: 18 18 18 18   Temp: 98 F (36.7 C) 99 F (37.2 C) 97.8 F (36.6 C) 97.8 F (36.6 C)  TempSrc: Oral Oral Oral Oral  SpO2: 100% 99% 100% 100%  Weight:      Height:       No intake or output data in the 24 hours ending 07/19/23 1206 Filed Weights   07/05/23 0905  Weight: 75.8 kg    Examination:  General exam: Appears calm and comfortable  Frail debilitated.  Chronically sick looking.  Not in any distress. Respiratory system: No added sounds. Cardiovascular system: S1 & S2 heard, RRR.  Gastrointestinal system: Soft and nontender.  Bowel sound present. Central nervous system: Alert and awake.  Flat affect.  Diffuse generalized weakness.    Data Reviewed: I have personally reviewed following labs and imaging studies  CBC: Recent Labs  Lab 07/14/23 0624 07/15/23 0428 07/16/23 0445 07/17/23 0621  WBC 7.2 6.7 6.9 8.1  NEUTROABS 4.2 3.8 3.6 4.5  HGB 12.6 12.8 12.2 13.6  HCT 40.1 39.9 38.4 42.3  MCV 85.5 84.7 85.5 84.3  PLT 240 244 255 247   Basic Metabolic Panel: Recent Labs  Lab 07/14/23 0624 07/15/23 0428 07/16/23 0445 07/17/23 0621  NA 137 137 137 139  K 4.3 4.2 4.4 4.3  CL 103 105 106 104  CO2 25 21* 23 24  GLUCOSE  108* 123* 107* 121*  BUN 36* 30* 32* 28*  CREATININE 0.98 0.87 0.90 0.93  CALCIUM  9.7 9.8 9.7 10.2   GFR: Estimated Creatinine Clearance: 42.6 mL/min (by C-G formula based on SCr of 0.93 mg/dL). Liver Function Tests: No results for input(s): AST, ALT, ALKPHOS, BILITOT, PROT, ALBUMIN in the last 168 hours. No results for input(s): LIPASE, AMYLASE in the last 168 hours. Recent Labs  Lab 07/12/23 1421  AMMONIA 12   Coagulation Profile: No results for input(s): INR, PROTIME in the last 168 hours. Cardiac Enzymes: No results for input(s): CKTOTAL, CKMB, CKMBINDEX, TROPONINI in the last 168 hours. BNP (last 3 results) No results for input(s): PROBNP in the last 8760 hours. HbA1C: No results for input(s): HGBA1C in the last 72 hours. CBG: Recent Labs  Lab 07/18/23 1551 07/18/23 1934 07/18/23 2303 07/19/23 0336 07/19/23 0736  GLUCAP 170* 158* 203* 101* 107*   Lipid Profile: No results for input(s): CHOL, HDL, LDLCALC, TRIG, CHOLHDL, LDLDIRECT in the last 72 hours. Thyroid  Function Tests: No results for input(s): TSH, T4TOTAL, FREET4, T3FREE, THYROIDAB in the last 72 hours. Anemia Panel: No results for input(s): VITAMINB12, FOLATE, FERRITIN, TIBC, IRON, RETICCTPCT in the last 72 hours. Sepsis Labs: No results for input(s): PROCALCITON, LATICACIDVEN in the last  168 hours.  No results found for this or any previous visit (from the past 240 hours).       Radiology Studies: No results found.      Scheduled Meds:  amLODipine   2.5 mg Oral Daily   carvedilol   12.5 mg Oral BID   enalapril   20 mg Oral QHS   fenofibrate   54 mg Oral Daily   hydrALAZINE   10 mg Oral TID   icosapent  Ethyl  1 g Oral BID WC   And   icosapent  Ethyl  2 g Oral QHS   insulin  aspart  0-15 Units Subcutaneous Q4H   lamoTRIgine   50 mg Oral BID   medroxyPROGESTERone   10 mg Oral Daily   mometasone -formoterol   2 puff Inhalation BID    nortriptyline   10 mg Oral QHS   pantoprazole   40 mg Oral Daily   QUEtiapine   12.5 mg Oral QPM   rosuvastatin   40 mg Oral Daily   sertraline   100 mg Oral QHS   sodium chloride  flush  3 mL Intravenous Q12H   umeclidinium bromide   1 puff Inhalation Daily   Continuous Infusions:   LOS: 13 days    Time spent: 35 minutes    Renato Applebaum, MD [facility]

## 2023-07-20 ENCOUNTER — Encounter (HOSPITAL_BASED_OUTPATIENT_CLINIC_OR_DEPARTMENT_OTHER): Payer: Medicare HMO | Admitting: Family

## 2023-07-20 DIAGNOSIS — E119 Type 2 diabetes mellitus without complications: Secondary | ICD-10-CM | POA: Diagnosis not present

## 2023-07-20 DIAGNOSIS — F0150 Vascular dementia without behavioral disturbance: Secondary | ICD-10-CM | POA: Diagnosis not present

## 2023-07-20 DIAGNOSIS — I5032 Chronic diastolic (congestive) heart failure: Secondary | ICD-10-CM | POA: Diagnosis not present

## 2023-07-20 DIAGNOSIS — Z794 Long term (current) use of insulin: Secondary | ICD-10-CM | POA: Diagnosis not present

## 2023-07-20 LAB — GLUCOSE, CAPILLARY
Glucose-Capillary: 107 mg/dL — ABNORMAL HIGH (ref 70–99)
Glucose-Capillary: 125 mg/dL — ABNORMAL HIGH (ref 70–99)
Glucose-Capillary: 125 mg/dL — ABNORMAL HIGH (ref 70–99)
Glucose-Capillary: 126 mg/dL — ABNORMAL HIGH (ref 70–99)
Glucose-Capillary: 127 mg/dL — ABNORMAL HIGH (ref 70–99)
Glucose-Capillary: 199 mg/dL — ABNORMAL HIGH (ref 70–99)
Glucose-Capillary: 73 mg/dL (ref 70–99)

## 2023-07-20 NOTE — Progress Notes (Signed)
 IVT consult placed for PIV placement. Patient only has PRN IV medications at this time and one have been used since 12/30. Primary RN notified that per [facility] policy, 'just in case' IV's are not indicated and to re-consult team should her needs change.   Nicklaus Alviar R Nkechi Linehan, RN

## 2023-07-20 NOTE — Progress Notes (Signed)
 IP rehab admissions - We have received a denial after appeal from insurance carrier for acute inpatient rehab admission.  I will notify CM and will notify the family.  Call for questions.  [phone]

## 2023-07-20 NOTE — Plan of Care (Signed)
 Restless in the morning but did better later on. Up in the chair, no seizure on shift.   Problem: Education: Goal: Ability to describe self-care measures that may prevent or decrease complications (Diabetes Survival Skills Education) will improve Outcome: Not Met (add Reason) Goal: Individualized Educational Video(s) Outcome: Not Met (add Reason)   Problem: Metabolic: Goal: Ability to maintain appropriate glucose levels will improve Outcome: Not Met (add Reason)   Problem: Nutritional: Goal: Maintenance of adequate nutrition will improve Outcome: Not Met (add Reason) Goal: Progress toward achieving an optimal weight will improve Outcome: Not Met (add Reason)   Problem: Education: Goal: Knowledge of General Education information will improve Description: Including pain rating scale, medication(s)/side effects and non-pharmacologic comfort measures Outcome: Not Met (add Reason)   Problem: Health Behavior/Discharge Planning: Goal: Ability to manage health-related needs will improve Outcome: Not Met (add Reason)   Problem: Activity: Goal: Risk for activity intolerance will decrease Outcome: Not Met (add Reason)   Problem: Safety: Goal: Ability to remain free from injury will improve Outcome: Not Met (add Reason)   Problem: Pain Management: Goal: General experience of comfort will improve Outcome: Not Met (add Reason)   Problem: Safety: Goal: Verbalization of understanding the information provided will improve Outcome: Not Met (add Reason)   Problem: Self-Concept: Goal: Level of anxiety will decrease Outcome: Not Met (add Reason) Goal: Ability to verbalize feelings about condition will improve Outcome: Not Met (add Reason)

## 2023-07-20 NOTE — TOC Progression Note (Signed)
 Transition of Care [facility]) - Progression Note    Patient Details  Name: Michelle [name] MRN: [id number] Date of Birth: 1932-11-18  [facility] CM/SW Contact  Andrez JULIANNA George, RN Phone Number: 07/20/2023, 2:08 PM  Clinical Narrative:     CM faxed updated clinicals to [facility] per [facility] request.  TOC following.  Expected Discharge Plan: IP Rehab Facility Barriers to Discharge: Continued Medical Work up  Expected Discharge Plan and Services   Discharge Planning Services: CM Consult Post Acute Care Choice: Home Health Living arrangements for the past 2 months: Single Family Home                                       Social Determinants of Health (SDOH) Interventions SDOH Screenings   Food Insecurity: Patient Unable To Answer (07/05/2023)  Housing: Patient Unable To Answer (07/05/2023)  Transportation Needs: Patient Unable To Answer (07/05/2023)  Utilities: Patient Unable To Answer (07/05/2023)  Depression (PHQ2-9): Medium Risk (06/26/2023)  Financial Resource Strain: Low Risk  (06/26/2023)  Physical Activity: Insufficiently Active (06/26/2023)  Social Connections: Socially Isolated (07/17/2023)  Stress: No Stress Concern Present (06/26/2023)  Tobacco Use: Medium Risk (07/05/2023)  Health Literacy: Adequate Health Literacy (06/26/2023)    Readmission Risk Interventions     No data to display

## 2023-07-20 NOTE — Consult Note (Signed)
 [facility] Liaison Consult Note   07/20/2023  Michelle [name] July 02, 1933 [id number]  Insurance: [employer] HMO   Primary Care Provider: Katrinka Garnette KIDD, MD, with [place] [facility], this provider is listed for the transition of care follow up appointments  and Community [facility] calls   [facility] Liaison screened the patient remotely at [facility]. For LLOS 15 days which includes an ICU stay, with extreme high risk score for unplanned readmissions.   The patient was screened for less than 30 day readmission hospitalization with noted extreme high risk score for unplanned readmission risk 4 hospital admissions in 6 months. Reviewed for LOC needs, SDOH, and disposition.  The patient was assessed for potential Community Care Coordination service needs for post hospital transition for care coordination. Review of patient's electronic medical record reveals patient is being considered for inpatient rehab however is awaiting insurance authorization.   Plan: [facility] Liaison will continue to follow progress and disposition to asess for post hospital community care coordination/management needs.  Referral request for community care coordination: pending disposition. If returning home, then [facility] is anticipated to reach out for follow up needs.   [facility] Community Care, Population Health does not replace or interfere with any arrangements made by the Inpatient Transition of Care team.   For questions contact:   Richerd Fish, RN, BSN, CCM [place]  [facility], [facility] Liaison Direct Dial: [phone] or secure chat Email: [email or web address]

## 2023-07-20 NOTE — Progress Notes (Signed)
 PROGRESS NOTE    Michelle [name]  FMW:[id number] DOB: [birth year] DOA: 07/05/2023 PCP: Katrinka Garnette KIDD, MD    Brief Narrative:   Michelle [name] is 89 y.o. female with dementia, hypertension, hyperlipidemia, diabetes, seizure disorder, COPD, CKD stage III, CAD status post stent, epilepsy, diastolic heart failure, anxiety, anemia presented to hospital with altered mental status and seizure.  Patient was recently admitted in October and November for breakthrough seizures.  In the ED patient was tachycardic, tachypneic.  Urinalysis was consistent with UTI.  Head CT revealed small volume subarachnoid hemorrhage in the right frontal lobe.  Neurology was consulted.  Patient was started on Keppra  with plans to eventually proceed with AED titration as outpatient.  She underwent EEG which revealed no seizure.  It was suspected that her breakthrough seizure was secondary to UTI.  Patient completed 5 days of antibiotics.  Stay has been prolonged due to discharge planning.  There is initially discussion to discharge home with hospice however given patient's severe deconditioning, family has concerns that they are unable to manage her needs at home.  Physical therapy recommended inpatient rehab.  This was applied for but denied by insurance.  Currently under the process of appealing this denial.   Subjective: Today, patient was seen and examined at bedside.  [name] at bedside.  [name] states that she is a little less sleepy but is still confused.  Intermittently sleeping.  Assessment & Plan:  Breakthrough seizure, superimposed on known seizure disorder Acute toxic metabolic encephalopathy, superimposed on chronic dementia Anxiety disorder Breakthrough seizure appears to be secondary to UTI currently mental status at baseline.  EEG without any seizure-like activity.  Neurology followed the patient during hospitalization and will continue on Lamictal  and Vascepa .  Continue home dose Seroquel .   Continue PT/OT, potential DC to CIR   Drowsiness Improved after Keppra  was discontinued.  Appears to be slightly volume depleted.  Encouraged the patient's son for oral intake.   Small right frontal subarachnoid hemorrhage - Seen on arrival.  Concerning for possible amyloid etiology.  CTA of the head and neck without any large vessel occlusion.  Seen by neurology.  No plan for anticoagulant.    Hypertension Low to normotensive.  Monitor as outpatient.   Klebsiella UTI Completed course of antibiotic.   CAD Hyperlipidemia History of STEMI status post stent 2015.  Neuro okay with resuming aspirin  81 mg as monotherapy and has recommended discontinuation of Plavix .     Chronic diastolic heart failure Last known 2D EF 60 to 65%, hold off with diuretic.SABRA  Appears to be slightly volume depleted.  Will encourage oral hydration.   COPD, not in exacerbation Continue inhaler, appears to be stable.   Ambulatory dysfunction Appears weak and deconditioned.  Plan for CIR/skilled nursing facility.    Diabetes melitis type II Continue sliding scale insulin , Accu-Cheks, diabetic diet.   Endometrial mass Known prior to arrival to the hospital.  Has intermittent vaginal bleeding on Provera .  Seen by gynecology in the past.   Severe deconditioning - Prior to this hospitalization patient was able to assist with transfers at home.  She is primarily cared for by her adult son who also has disabilities. Patient was denied for CIR by insurance.  Peer-to-peer completed 12/30.  Family currently appealing.    Goals of care - Currently DNR, family requesting intubation if required procedures.  Palliative care on board.  DVT prophylaxis: Place and maintain sequential compression device Start: 07/18/23 1624 Place and maintain sequential compression device  Start: 07/12/23 1405 SCDs Start: 07/05/23 1423   Code Status: DNR with full intervention  Family Communication:   Spoke with the patient's son at  bedside.  Disposition Plan: CIR if approved  Status is: Inpatient  Remains inpatient appropriate because: Waiting to go to CIR, somnolence.    Consultants:  Neurology  Procedures:  EEG  Antimicrobials:  None currently  Objective: Vitals:   07/20/23 0006 07/20/23 0319 07/20/23 0819 07/20/23 0822  BP: (!) 150/67 (!) 143/116 (!) 155/71   Pulse: 85 88 84 81  Resp: 17 16 16 16   Temp: 98.2 F (36.8 C) 98 F (36.7 C) 98.1 F (36.7 C)   TempSrc: Oral  Oral   SpO2: 100% 100% 100% 99%  Weight:      Height:        Intake/Output Summary (Last 24 hours) at 07/20/2023 0843 Last data filed at 07/19/2023 1600 Gross per 24 hour  Intake 360 ml  Output --  Net 360 ml   Filed Weights   07/05/23 0905  Weight: 75.8 kg    Physical examination: General:  Average built, not in obvious distress ill, frail and debilitated, intermittently somnolent. HENT:   No scleral pallor or icterus noted. Oral mucosa is moist.  Chest:    Diminished breath sounds bilaterally. No crackles or wheezes.  CVS: S1 &S2 heard. No murmur.  Regular rate and rhythm. Abdomen: Soft, nontender, nondistended.  Bowel sounds are heard.   Extremities: No cyanosis, clubbing or edema.  Peripheral pulses are palpable. Psych: Mildly somnolent but mildly Communicative flat affect disoriented CNS:  No cranial nerve deficits.  Diffuse generalized weakness. Skin: Warm and dry.  No rashes noted.  Data Reviewed: I have personally reviewed following labs and imaging studies  CBC: Recent Labs  Lab 07/14/23 0624 07/15/23 0428 07/16/23 0445 07/17/23 0621  WBC 7.2 6.7 6.9 8.1  NEUTROABS 4.2 3.8 3.6 4.5  HGB 12.6 12.8 12.2 13.6  HCT 40.1 39.9 38.4 42.3  MCV 85.5 84.7 85.5 84.3  PLT 240 244 255 247   Basic Metabolic Panel: Recent Labs  Lab 07/14/23 0624 07/15/23 0428 07/16/23 0445 07/17/23 0621  NA 137 137 137 139  K 4.3 4.2 4.4 4.3  CL 103 105 106 104  CO2 25 21* 23 24  GLUCOSE 108* 123* 107* 121*  BUN 36* 30*  32* 28*  CREATININE 0.98 0.87 0.90 0.93  CALCIUM  9.7 9.8 9.7 10.2   GFR: Estimated Creatinine Clearance: 42.6 mL/min (by C-G formula based on SCr of 0.93 mg/dL). Liver Function Tests: No results for input(s): AST, ALT, ALKPHOS, BILITOT, PROT, ALBUMIN in the last 168 hours. No results for input(s): LIPASE, AMYLASE in the last 168 hours. No results for input(s): AMMONIA in the last 168 hours.  Coagulation Profile: No results for input(s): INR, PROTIME in the last 168 hours. Cardiac Enzymes: No results for input(s): CKTOTAL, CKMB, CKMBINDEX, TROPONINI in the last 168 hours. BNP (last 3 results) No results for input(s): PROBNP in the last 8760 hours. HbA1C: No results for input(s): HGBA1C in the last 72 hours. CBG: Recent Labs  Lab 07/19/23 1625 07/19/23 2017 07/20/23 0015 07/20/23 0410 07/20/23 0810  GLUCAP 179* 169* 73 126* 107*   Lipid Profile: No results for input(s): CHOL, HDL, LDLCALC, TRIG, CHOLHDL, LDLDIRECT in the last 72 hours. Thyroid  Function Tests: No results for input(s): TSH, T4TOTAL, FREET4, T3FREE, THYROIDAB in the last 72 hours. Anemia Panel: No results for input(s): VITAMINB12, FOLATE, FERRITIN, TIBC, IRON, RETICCTPCT in the last 72 hours.  Sepsis Labs: No results for input(s): PROCALCITON, LATICACIDVEN in the last 168 hours.  No results found for this or any previous visit (from the past 240 hours).   Radiology Studies: No results found.  Scheduled Meds:  amLODipine   2.5 mg Oral Daily   carvedilol   12.5 mg Oral BID   enalapril   20 mg Oral QHS   fenofibrate   54 mg Oral Daily   hydrALAZINE   10 mg Oral TID   icosapent  Ethyl  1 g Oral BID WC   And   icosapent  Ethyl  2 g Oral QHS   insulin  aspart  0-15 Units Subcutaneous Q4H   lamoTRIgine   50 mg Oral BID   medroxyPROGESTERone   10 mg Oral Daily   mometasone -formoterol   2 puff Inhalation BID   nortriptyline   10 mg Oral QHS    pantoprazole   40 mg Oral Daily   QUEtiapine   12.5 mg Oral QPM   rosuvastatin   40 mg Oral Daily   sertraline   100 mg Oral QHS   sodium chloride  flush  3 mL Intravenous Q12H   umeclidinium bromide   1 puff Inhalation Daily   Continuous Infusions:   LOS: 14 days    Susannah Carbin, MD [facility]

## 2023-07-20 NOTE — Progress Notes (Signed)
 Physical Therapy Treatment Patient Details Name: Michelle [name] MRN: [id number] DOB: [birth year] Today's Date: 07/20/2023   History of Present Illness 89 yo female presenting with AMS and seizures on 07/05/23. CT head showing Small volume subarachnoid hemorrhage in the right frontal lobe. Seizure meds and BP meds being adjusted.  PMH including dementia, hypertension, hyperlipidemia, diabetes, COPD, CKD 3, CAD status post stent, epilepsy, diastolic CHF, anxiety, anemia.    PT Comments  Pt supine in bed on arrival this session.  She was alert on arrival.  Pt became more drowsy performed OOB to recliner and sit to stand in sara stedy x 3 with assistance and facilitation for upper trunk control and hip extension.     If plan is discharge home, recommend the following: Supervision due to cognitive status;Assist for transportation;Assistance with cooking/housework;Help with stairs or ramp for entrance;Direct supervision/assist for medications management;Direct supervision/assist for financial management;Two people to help with walking and/or transfers   Can travel by private vehicle        Equipment Recommendations  None recommended by PT    Recommendations for Other Services       Precautions / Restrictions Precautions Precautions: Fall Precaution Comments: LTM EEG Restrictions Weight Bearing Restrictions Per Provider Order: No     Mobility  Bed Mobility Overal bed mobility: Needs Assistance Bed Mobility: Supine to Sit     Supine to sit: Mod assist     General bed mobility comments: Cues for LE sequencing to edge of bed and trunk control to rise into sitting.    Transfers Overall transfer level: Needs assistance Equipment used: Ambulation equipment used (sara stedy and face to face transfer) Transfers: Sit to/from Stand, Bed to chair/wheelchair/BSC Sit to Stand: +2 physical assistance, Max assist     Squat pivot transfers: Max assist     General transfer comment: Cues for  forward weight shifting and hip extension.  Pt required cues for hand placement upper trunk control and forward gaze.    Ambulation/Gait                   Stairs             Wheelchair Mobility     Tilt Bed    Modified Rankin (Stroke Patients Only)       Balance Overall balance assessment: Needs assistance Sitting-balance support: No upper extremity supported, Feet supported Sitting balance-Leahy Scale: Fair Sitting balance - Comments: supervision for sitting balance, some posterior lean initially     Standing balance-Leahy Scale: Poor Standing balance comment: Needs RW; stood for max of 60 seconds, with fatigue begins to forward flex and try to prop on her forearms on sara stedy with assist for upright posture                            Cognition Arousal: Lethargic, Alert Behavior During Therapy: WFL for tasks assessed/performed Overall Cognitive Status: Impaired/Different from baseline Area of Impairment: Memory, Following commands, Safety/judgement, Awareness, Problem solving, Orientation, Attention                 Orientation Level: Situation Current Attention Level: Sustained   Following Commands: Follows one step commands consistently, Follows multi-step commands inconsistently, Follows one step commands with increased time Safety/Judgement: Decreased awareness of safety, Decreased awareness of deficits Awareness: Emergent Problem Solving: Slow processing, Requires tactile cues, Requires verbal cues, Decreased initiation General Comments: Remains drowsy and intermittently alert required increased time and effort.  Exercises      General Comments        Pertinent Vitals/Pain Pain Assessment Pain Assessment: Faces Faces Pain Scale: Hurts little more Pain Location: R shoulder Pain Descriptors / Indicators: Grimacing, Guarding Pain Intervention(s): Monitored during session, Repositioned    Home Living                           Prior Function            PT Goals (current goals can now be found in the care plan section) Acute Rehab PT Goals Patient Stated Goal: return home Potential to Achieve Goals: Fair Progress towards PT goals: Progressing toward goals    Frequency    Min 1X/week      PT Plan      Co-evaluation              AM-PAC PT 6 Clicks Mobility   Outcome Measure  Help needed turning from your back to your side while in a flat bed without using bedrails?: A Lot Help needed moving from lying on your back to sitting on the side of a flat bed without using bedrails?: A Lot Help needed moving to and from a bed to a chair (including a wheelchair)?: Total Help needed standing up from a chair using your arms (e.g., wheelchair or bedside chair)?: Total Help needed to walk in hospital room?: Total Help needed climbing 3-5 steps with a railing? : Total 6 Click Score: 8    End of Session Equipment Utilized During Treatment: Gait belt Activity Tolerance: Patient limited by fatigue Patient left: in chair;with call bell/phone within reach;with chair alarm set;with family/visitor present Nurse Communication: Mobility status PT Visit Diagnosis: Other abnormalities of gait and mobility (R26.89);Muscle weakness (generalized) (M62.81);Other symptoms and signs involving the nervous system (R29.898)     Time: 8881-8851 PT Time Calculation (min) (ACUTE ONLY): 30 min  Charges:    $Therapeutic Activity: 23-37 mins PT General Charges $$ ACUTE PT VISIT: 1 Visit                     Toya HAMS , PTA Acute Rehabilitation Services Office [phone]    Toya JINNY Gosling 07/20/2023, 12:05 PM

## 2023-07-21 DIAGNOSIS — I609 Nontraumatic subarachnoid hemorrhage, unspecified: Secondary | ICD-10-CM | POA: Diagnosis not present

## 2023-07-21 DIAGNOSIS — R569 Unspecified convulsions: Secondary | ICD-10-CM | POA: Diagnosis not present

## 2023-07-21 LAB — BASIC METABOLIC PANEL
Anion gap: 8 (ref 5–15)
BUN: 33 mg/dL — ABNORMAL HIGH (ref 8–23)
CO2: 26 mmol/L (ref 22–32)
Calcium: 10 mg/dL (ref 8.9–10.3)
Chloride: 102 mmol/L (ref 98–111)
Creatinine, Ser: 0.99 mg/dL (ref 0.44–1.00)
GFR, Estimated: 55 mL/min — ABNORMAL LOW (ref >60)
Glucose, Bld: 180 mg/dL — ABNORMAL HIGH (ref 70–99)
Potassium: 4.3 mmol/L (ref 3.5–5.1)
Sodium: 136 mmol/L (ref 135–145)

## 2023-07-21 LAB — CBC
HCT: 41.6 % (ref 36.0–46.0)
Hemoglobin: 13.3 g/dL (ref 12.0–15.0)
MCH: 27.1 pg (ref 26.0–34.0)
MCHC: 32 g/dL (ref 30.0–36.0)
MCV: 84.7 fL (ref 80.0–100.0)
Platelets: 236 10*3/uL (ref 150–400)
RBC: 4.91 MIL/uL (ref 3.87–5.11)
RDW: 14.7 % (ref 11.5–15.5)
WBC: 5.8 10*3/uL (ref 4.0–10.5)
nRBC: 0 % (ref 0.0–0.2)

## 2023-07-21 LAB — GLUCOSE, CAPILLARY
Glucose-Capillary: 102 mg/dL — ABNORMAL HIGH (ref 70–99)
Glucose-Capillary: 113 mg/dL — ABNORMAL HIGH (ref 70–99)
Glucose-Capillary: 133 mg/dL — ABNORMAL HIGH (ref 70–99)
Glucose-Capillary: 148 mg/dL — ABNORMAL HIGH (ref 70–99)
Glucose-Capillary: 179 mg/dL — ABNORMAL HIGH (ref 70–99)
Glucose-Capillary: 93 mg/dL (ref 70–99)

## 2023-07-21 LAB — MAGNESIUM: Magnesium: 1.7 mg/dL (ref 1.7–2.4)

## 2023-07-21 NOTE — Progress Notes (Signed)
 [facility] Progress Note Patient: Michelle [name] FMW:[id number] DOB: [birth year] DOA: 07/05/2023  DOS: the patient was seen and examined on 07/21/2023  Brief Hospital Course: PMH of HTN, dementia, HLD, type II DM, seizures, COPD, CKD, CAD, HFpEF present to the hospital with complaints of confusion and seizure. Was initially started on Keppra . Become significantly drowsy. Mentation improving after stopping Keppra .  Monitor.  Assessment and plan. Seizure disorder. Breakthrough seizures. Acute toxic and metabolic encephalopathy. Presents with confusion. Likely cause of seizures most likely UTI. EEG without any active seizures. Neurology was consulted. On Lamictal  and Vimpat . Was on Keppra  which is now discontinued.  Depression. Mood disorder. On Seroquel .  Continue.  Drowsiness. Seems to have resolved. After stopping Keppra .  Right frontal SAH. Seen on admission.  Possible amyloid etiology.  CTA without any large vessel occlusion.  Neurology recommended no intervention.  No anticoagulation.  HTN. Chronic HFpEF Blood pressure stable.  Monitor. Will attempt to optimize medications rather than use multiple medication.  CAD. On aspirin .  Klebsiella UTI. Treated with antibiotic.  Endometrial mass. Vaginal bleeding. On Provera . Outpatient follow-up with GYN.  Type II DM. Continue sliding scale insulin . COPD. Stable. HLD. Continue statin.  Subjective: Reports some headache.  No nausea no vomiting no fever no chills.  Physical Exam: General: in Mild distress, No Rash Cardiovascular: S1 and S2 Present, No Murmur Respiratory: Good respiratory effort, Bilateral Air entry present. No Crackles, No wheezes Abdomen: Bowel Sound present, No tenderness Extremities: No edema Neuro: Alert and oriented x3, no new focal deficit  Data Reviewed: I have Reviewed nursing notes, Vitals, and Lab results. Since last encounter, pertinent lab results CBC and BMP   . I have  ordered test including CBC and BMP  .   Disposition: Status is: Inpatient Remains inpatient appropriate because: Monitor for improvement in mentation.  Place and maintain sequential compression device Start: 07/18/23 1624 Place and maintain sequential compression device Start: 07/12/23 1405 SCDs Start: 07/05/23 1423   Family Communication: Son at bedside Level of care: Med-Surg   Vitals:   07/21/23 0746 07/21/23 0853 07/21/23 1100 07/21/23 1529  BP: (!) 146/62  113/79 101/61  Pulse: 67 72 62 63  Resp: 18 18 16 18   Temp: 98.7 F (37.1 C)  97.8 F (36.6 C) 98 F (36.7 C)  TempSrc: Oral  Oral Oral  SpO2: 100% 99% 100% 100%  Weight:      Height:         Author: Yetta Blanch, MD 07/21/2023 6:42 PM  Please look on [email or web address] to find out who is on call.

## 2023-07-21 NOTE — Progress Notes (Signed)
 Physical Therapy Treatment Patient Details Name: Michelle [name] MRN: [id number] DOB: Mar 05, 1933 Today's Date: 07/21/2023   History of Present Illness 89 yo female presenting with AMS and seizures on 07/05/23. CT head showing Small volume subarachnoid hemorrhage in the right frontal lobe. Seizure meds and BP meds being adjusted.  PMH including dementia, hypertension, hyperlipidemia, diabetes, COPD, CKD 3, CAD status post stent, epilepsy, diastolic CHF, anxiety, anemia.    PT Comments  Pt up in chair on arrival and agreeable to session, however continues to be limited by fatigue. Pt awake and alert throughout session, however needing increased time and cues to follow all commands and complete mobility. Pt requiring max A to come to stand with Rw support and in stedy frame from recliner chair with max cues for upright trunk and hands on assist to shift hips anterior in stedy to achieve. Pt able to stand from stedy pads with mod A. Pt unable to progress gait this session. Pt continues to benefit from skilled PT services to progress toward functional mobility goals.     If plan is discharge home, recommend the following: Supervision due to cognitive status;Assist for transportation;Assistance with cooking/housework;Help with stairs or ramp for entrance;Direct supervision/assist for medications management;Direct supervision/assist for financial management;Two people to help with walking and/or transfers   Can travel by private vehicle     No  Equipment Recommendations  None recommended by PT    Recommendations for Other Services       Precautions / Restrictions Precautions Precautions: Fall Restrictions Weight Bearing Restrictions Per Provider Order: No     Mobility  Bed Mobility Overal bed mobility: Needs Assistance             General bed mobility comments: pt up in chair on arrival    Transfers Overall transfer level: Needs assistance Equipment used: Ambulation equipment used,  Rolling walker (2 wheels) (sara stedy) Transfers: Sit to/from Stand, Bed to chair/wheelchair/BSC Sit to Stand: Max assist, Mod assist           General transfer comment: Cues for forward weight shifting and hip extension.  Pt required cues for hand placement upper trunk control and forward gaze.    Ambulation/Gait               General Gait Details: unable to lift feet to take steps   Stairs             Wheelchair Mobility     Tilt Bed    Modified Rankin (Stroke Patients Only)       Balance Overall balance assessment: Needs assistance Sitting-balance support: No upper extremity supported, Feet supported Sitting balance-Leahy Scale: Fair Sitting balance - Comments: supervision for sitting balance, some posterior lean initially Postural control: Right lateral lean, Posterior lean Standing balance support: Bilateral upper extremity supported Standing balance-Leahy Scale: Poor Standing balance comment: stood for max of 60 seconds, with fatigue begins to forward flex and try to prop on her forearms on sara stedy with assist for upright posture                            Cognition Arousal: Alert Behavior During Therapy: WFL for tasks assessed/performed Overall Cognitive Status: Impaired/Different from baseline Area of Impairment: Memory, Following commands, Safety/judgement, Awareness, Problem solving, Orientation, Attention                 Orientation Level: Situation Current Attention Level: Sustained Memory: Decreased short-term memory Following Commands: Follows  one step commands consistently, Follows multi-step commands inconsistently, Follows one step commands with increased time Safety/Judgement: Decreased awareness of safety, Decreased awareness of deficits Awareness: Emergent Problem Solving: Slow processing, Requires tactile cues, Requires verbal cues, Decreased initiation General Comments: required increased time and effort.         Exercises      General Comments General comments (skin integrity, edema, etc.): Son present.      Pertinent Vitals/Pain Pain Assessment Pain Assessment: Faces Faces Pain Scale: Hurts little more Pain Location: Bil LEs Pain Descriptors / Indicators: Grimacing, Guarding Pain Intervention(s): Monitored during session, Limited activity within patient's tolerance    Home Living                          Prior Function            PT Goals (current goals can now be found in the care plan section) Acute Rehab PT Goals Patient Stated Goal: return home PT Goal Formulation: With patient/family Time For Goal Achievement: 07/21/23 Progress towards PT goals: Progressing toward goals    Frequency    Min 1X/week      PT Plan      Co-evaluation              AM-PAC PT 6 Clicks Mobility   Outcome Measure  Help needed turning from your back to your side while in a flat bed without using bedrails?: A Lot Help needed moving from lying on your back to sitting on the side of a flat bed without using bedrails?: A Lot Help needed moving to and from a bed to a chair (including a wheelchair)?: Total Help needed standing up from a chair using your arms (e.g., wheelchair or bedside chair)?: Total Help needed to walk in hospital room?: Total Help needed climbing 3-5 steps with a railing? : Total 6 Click Score: 8    End of Session   Activity Tolerance: Patient limited by fatigue Patient left: in chair;with call bell/phone within reach;with chair alarm set;with family/visitor present Nurse Communication: Mobility status PT Visit Diagnosis: Other abnormalities of gait and mobility (R26.89);Muscle weakness (generalized) (M62.81);Other symptoms and signs involving the nervous system (R29.898)     Time: 8474-8454 PT Time Calculation (min) (ACUTE ONLY): 20 min  Charges:    $Therapeutic Activity: 8-22 mins PT General Charges $$ ACUTE PT VISIT: 1 Visit                      Judson Tsan R. PTA Acute Rehabilitation Services Office: [phone]   Therisa CHRISTELLA Boor 07/21/2023, 4:34 PM

## 2023-07-21 NOTE — TOC Progression Note (Addendum)
 Transition of Care [facility]) - Progression Note    Patient Details  Name: CARISHA KANTOR MRN: [id number] Date of Birth: [birth year]  [facility] CM/SW Contact  Andrez JULIANNA George, RN Phone Number: 07/21/2023, 11:01 AM  Clinical Narrative:     Pt was denied for CIR. Family wants to have her go to SNF for a short bout of rehab again even though she is in co-pay days. They prefer to attend [facility] again. Referral sent and CM updated Sierra at [facility]. Sierra to update CM on the co pay cost after she runs insurance.  TOC following.  1/3: [facility] has accepted and CM has asked TOC MOA to start insurance auth. [facility] will have a bed on Monday. Family aware of co pays and in agreement.  Expected Discharge Plan: Home/Self Care Barriers to Discharge: Continued Medical Work up  Expected Discharge Plan and Services   Discharge Planning Services: CM Consult Post Acute Care Choice: Home Health Living arrangements for the past 2 months: Single Family Home                                       Social Determinants of Health (SDOH) Interventions SDOH Screenings   Food Insecurity: Patient Unable To Answer (07/05/2023)  Housing: Patient Unable To Answer (07/05/2023)  Transportation Needs: Patient Unable To Answer (07/05/2023)  Utilities: Patient Unable To Answer (07/05/2023)  Depression (PHQ2-9): Medium Risk (06/26/2023)  Financial Resource Strain: Low Risk  (06/26/2023)  Physical Activity: Insufficiently Active (06/26/2023)  Social Connections: Socially Isolated (07/17/2023)  Stress: No Stress Concern Present (06/26/2023)  Tobacco Use: Medium Risk (07/05/2023)  Health Literacy: Adequate Health Literacy (06/26/2023)    Readmission Risk Interventions     No data to display

## 2023-07-21 NOTE — Plan of Care (Signed)
 No acute changes, awaiting placement.   Problem: Education: Goal: Ability to describe self-care measures that may prevent or decrease complications (Diabetes Survival Skills Education) will improve Outcome: Not Met (add Reason)   Problem: Health Behavior/Discharge Planning: Goal: Ability to identify and utilize available resources and services will improve Outcome: Not Met (add Reason) Goal: Ability to manage health-related needs will improve Outcome: Not Met (add Reason)   Problem: Skin Integrity: Goal: Risk for impaired skin integrity will decrease Outcome: Not Met (add Reason)   Problem: Tissue Perfusion: Goal: Adequacy of tissue perfusion will improve Outcome: Not Met (add Reason)   Problem: Activity: Goal: Risk for activity intolerance will decrease Outcome: Not Met (add Reason)   Problem: Safety: Goal: Ability to remain free from injury will improve Outcome: Not Met (add Reason)   Problem: Skin Integrity: Goal: Risk for impaired skin integrity will decrease Outcome: Not Met (add Reason)

## 2023-07-21 NOTE — NC FL2 (Signed)
 [place]  MEDICAID FL2 LEVEL OF CARE FORM     IDENTIFICATION  Patient Name: Michelle [name] Birthdate: [birth year] Sex: female Admission Date (Current Location): 07/05/2023  [facility] and [place] Number:  Producer, Television/film/video and Address:  [facility] [place] [address], [employer] [place], [place] [address]      Provider Number: [id number]  Attending Physician Name and Address:  Tobie Yetta HERO, MD  Relative Name and Phone Number:       Current Level of Care: Hospital Recommended Level of Care: Skilled Nursing Facility Prior Approval Number:    Date Approved/Denied:   PASRR Number: [id number]  Discharge Plan: SNF    Current Diagnoses: Patient Active Problem List   Diagnosis Date Noted   Seizures (HCC) 07/17/2023   Subarachnoid hemorrhage (HCC) 07/05/2023   Seizure (HCC) 07/05/2023   Overweight 06/16/2023   Hyperkalemia 06/14/2023   Breakthrough seizure (HCC) 05/17/2023   Wheelchair dependence 05/17/2023   DNR (do not resuscitate)/DNI(Do Not Intubate) 05/17/2023   Memory impairment 09/09/2022   Chronic pain of both knees 04/05/2022   Epilepsy (HCC) 03/12/2021   Adnexal mass 11/24/2020   Thickened endometrium 11/24/2020   Dementia (HCC) 10/22/2020   Hypokalemia 08/19/2020   Actinic keratosis 11/17/2017   Anemia, iron deficiency 10/23/2015   CKD (chronic kidney disease), stage III (HCC) 07/25/2014   Osteoarthritis, knee 05/13/2014   Anxiety state 04/21/2014   Chronic diastolic CHF (congestive heart failure) (HCC) 03/13/2014   SOB (shortness of breath) 03/05/2014   CAD- RCA PCI '90s, STEMI-RCA DES 02/18/14 03/05/2014   Back pain, lumbosacral 10/10/2012   Type 2 diabetes mellitus without complication, with long-term current use of insulin  (HCC) 03/26/2007   Hyperlipidemia associated with type 2 diabetes mellitus (HCC) 03/26/2007   Essential hypertension 04/21/2006   COPD (chronic obstructive pulmonary disease) (HCC) 04/21/2006    Orientation  RESPIRATION BLADDER Height & Weight     Self  Normal Incontinent Weight: 75.8 kg Height:  5' 5 (165.1 cm)  BEHAVIORAL SYMPTOMS/MOOD NEUROLOGICAL BOWEL NUTRITION STATUS      Continent Diet (dysphagia 3 with thin liquids)  AMBULATORY STATUS COMMUNICATION OF NEEDS Skin   Limited Assist Verbally  (bruising to bilateral arms/ redness to buttocks and groin)                       Personal Care Assistance Level of Assistance  Bathing, Feeding, Dressing Bathing Assistance: Maximum assistance Feeding assistance: Limited assistance Dressing Assistance: Maximum assistance     Functional Limitations Info  Sight, Hearing, Speech Sight Info: Impaired Hearing Info: Adequate Speech Info: Adequate    SPECIAL CARE FACTORS FREQUENCY  PT (By licensed PT), OT (By licensed OT), Speech therapy     PT Frequency: 5x/wk OT Frequency: 5x/wk            Contractures Contractures Info: Not present    Additional Factors Info  Code Status, Allergies, Psychotropic, Insulin  Sliding Scale Code Status Info: DNR Allergies Info: Haldol / codeine sulfate/ morphine  sulfate Psychotropic Info: Lamictal  50 mg BiD/ Seroquel  12.5 mg at bedtime/ Zoloft  100 mg bedtime Insulin  Sliding Scale Info: novolog  0-15 units SQ every 4 hours       Current Medications (07/21/2023):  This is the current hospital active medication list Current Facility-Administered Medications  Medication Dose Route Frequency Provider Last Rate Last Admin   acetaminophen  (TYLENOL ) tablet 650 mg  650 mg Oral Q6H PRN Seena Marsa NOVAK, MD   650 mg at 07/18/23 2119   Or  acetaminophen  (TYLENOL ) suppository 650 mg  650 mg Rectal Q6H PRN Seena Marsa NOVAK, MD       albuterol  (PROVENTIL ) (2.5 MG/3ML) 0.083% nebulizer solution 2.5 mg  2.5 mg Inhalation Q4H PRN Seena Marsa NOVAK, MD       alum & mag hydroxide-simeth (MAALOX/MYLANTA) 200-200-20 MG/5ML suspension 30 mL  30 mL Oral Q4H PRN Dezii, Alexandra, DO   30 mL at 07/12/23 2034    amLODipine  (NORVASC ) tablet 2.5 mg  2.5 mg Oral Daily Kc, Ramesh, MD   2.5 mg at 07/21/23 0849   carvedilol  (COREG ) tablet 12.5 mg  12.5 mg Oral BID Melvin, Alexander B, MD   12.5 mg at 07/21/23 9150   enalapril  (VASOTEC ) tablet 20 mg  20 mg Oral QHS Melvin, Alexander B, MD   20 mg at 07/20/23 2219   fenofibrate  tablet 54 mg  54 mg Oral Daily Kc, Mennie, MD   54 mg at 07/21/23 0849   hydrALAZINE  (APRESOLINE ) injection 10 mg  10 mg Intravenous Q6H PRN Mansy, Jan A, MD   10 mg at 07/17/23 9580   hydrALAZINE  (APRESOLINE ) tablet 10 mg  10 mg Oral TID Christobal Mennie, MD   10 mg at 07/21/23 0849   icosapent  Ethyl (VASCEPA ) 1 g capsule 1 g  1 g Oral BID WC Jardin, Carla G, RPH   1 g at 07/21/23 9152   And   icosapent  Ethyl (VASCEPA ) 1 g capsule 2 g  2 g Oral QHS Jardin, Carla G, RPH   2 g at 07/20/23 2219   insulin  aspart (novoLOG ) injection 0-15 Units  0-15 Units Subcutaneous Q4H Melvin, Alexander B, MD   2 Units at 07/21/23 0050   labetalol  (NORMODYNE ) injection 5 mg  5 mg Intravenous Q2H PRN Melvin, Alexander B, MD   5 mg at 07/15/23 0041   lamoTRIgine  (LAMICTAL ) tablet 50 mg  50 mg Oral BID Melvin, Alexander B, MD   50 mg at 07/21/23 9150   loperamide  (IMODIUM ) capsule 2 mg  2 mg Oral BID PRN Melvin, Alexander B, MD       LORazepam  (ATIVAN ) injection 2 mg  2 mg Intravenous Q5 Min x 2 PRN Melvin, Alexander B, MD       medroxyPROGESTERone  (PROVERA ) tablet 10 mg  10 mg Oral Daily Melvin, Alexander B, MD   10 mg at 07/21/23 0849   melatonin tablet 3 mg  3 mg Oral QHS PRN Christobal Mennie, MD   3 mg at 07/20/23 2221   mometasone -formoterol  (DULERA ) 200-5 MCG/ACT inhaler 2 puff  2 puff Inhalation BID Melvin, Alexander B, MD   2 puff at 07/21/23 9146   nortriptyline  (PAMELOR ) capsule 10 mg  10 mg Oral QHS Kc, Mennie, MD   10 mg at 07/20/23 2218   pantoprazole  (PROTONIX ) EC tablet 40 mg  40 mg Oral Daily Melvin, Alexander B, MD   40 mg at 07/21/23 0849   polyethylene glycol (MIRALAX  / GLYCOLAX ) packet 17 g  17 g Oral  Daily PRN Melvin, Alexander B, MD       QUEtiapine  (SEROQUEL ) tablet 12.5 mg  12.5 mg Oral QPM Dezii, Alexandra, DO   12.5 mg at 07/20/23 1727   rosuvastatin  (CRESTOR ) tablet 40 mg  40 mg Oral Daily Melvin, Alexander B, MD   40 mg at 07/21/23 0849   sertraline  (ZOLOFT ) tablet 100 mg  100 mg Oral QHS Kc, Mennie, MD   100 mg at 07/20/23 2221   sodium chloride  flush (NS) 0.9 % injection 3 mL  3 mL Intravenous Q12H Seena Marsa NOVAK, MD   3 mL at 07/19/23 2148   umeclidinium bromide  (INCRUSE ELLIPTA ) 62.5 MCG/ACT 1 puff  1 puff Inhalation Daily Seena Marsa NOVAK, MD   1 puff at 07/21/23 9147     Discharge Medications: Please see discharge summary for a list of discharge medications.  Relevant Imaging Results:  Relevant Lab Results:   Additional Information SS#: [id number]  Andrez JULIANNA George, RN

## 2023-07-22 DIAGNOSIS — R569 Unspecified convulsions: Secondary | ICD-10-CM | POA: Diagnosis not present

## 2023-07-22 LAB — GLUCOSE, CAPILLARY
Glucose-Capillary: 107 mg/dL — ABNORMAL HIGH (ref 70–99)
Glucose-Capillary: 110 mg/dL — ABNORMAL HIGH (ref 70–99)
Glucose-Capillary: 179 mg/dL — ABNORMAL HIGH (ref 70–99)
Glucose-Capillary: 168 mg/dL — ABNORMAL HIGH (ref 70–99)
Glucose-Capillary: 114 mg/dL — ABNORMAL HIGH (ref 70–99)
Glucose-Capillary: 132 mg/dL — ABNORMAL HIGH (ref 70–99)
Glucose-Capillary: 187 mg/dL — ABNORMAL HIGH (ref 70–99)

## 2023-07-22 MED ORDER — AMLODIPINE BESYLATE 2.5 MG PO TABS
2.5000 mg | ORAL_TABLET | Freq: Once | ORAL | Status: AC
  Administered 2023-07-22: 2.5 mg via ORAL

## 2023-07-22 MED ORDER — POLYETHYLENE GLYCOL 3350 17 G PO PACK
17.0000 g | PACK | Freq: Every day | ORAL | Status: DC
  Administered 2023-07-22 – 2023-07-24 (×3): 17 g via ORAL
  Filled 2023-07-22 (×3): qty 1

## 2023-07-22 MED ORDER — SENNOSIDES-DOCUSATE SODIUM 8.6-50 MG PO TABS
1.0000 | ORAL_TABLET | Freq: Two times a day (BID) | ORAL | Status: DC
  Administered 2023-07-22 – 2023-07-24 (×5): 1 via ORAL
  Filled 2023-07-22 (×5): qty 1

## 2023-07-22 MED ORDER — ENOXAPARIN SODIUM 40 MG/0.4ML IJ SOSY: 40.0000 mg | PREFILLED_SYRINGE | INTRAMUSCULAR | Status: DC

## 2023-07-22 MED ORDER — INSULIN ASPART 100 UNIT/ML IJ SOLN: 0.0000 [IU] | Freq: Every day | INTRAMUSCULAR | Status: DC

## 2023-07-22 MED ORDER — INSULIN ASPART 100 UNIT/ML IJ SOLN
0.0000 [IU] | Freq: Three times a day (TID) | INTRAMUSCULAR | Status: DC
  Administered 2023-07-22 – 2023-07-23 (×2): 2 [IU] via SUBCUTANEOUS
  Administered 2023-07-23: 3 [IU] via SUBCUTANEOUS
  Administered 2023-07-23 – 2023-07-24 (×2): 1 [IU] via SUBCUTANEOUS
  Administered 2023-07-24: 2 [IU] via SUBCUTANEOUS

## 2023-07-22 MED ORDER — AMLODIPINE BESYLATE 5 MG PO TABS
5.0000 mg | ORAL_TABLET | Freq: Every day | ORAL | Status: DC
  Administered 2023-07-23 – 2023-07-24 (×2): 5 mg via ORAL
  Filled 2023-07-22 (×2): qty 1

## 2023-07-22 NOTE — Plan of Care (Signed)
  Problem: Education: Goal: Ability to describe self-care measures that may prevent or decrease complications (Diabetes Survival Skills Education) will improve Outcome: Progressing Goal: Individualized Educational Video(s) Outcome: Progressing   Problem: Coping: Goal: Ability to adjust to condition or change in health will improve Outcome: Progressing   Problem: Fluid Volume: Goal: Ability to maintain a balanced intake and output will improve Outcome: Progressing   Problem: Health Behavior/Discharge Planning: Goal: Ability to identify and utilize available resources and services will improve Outcome: Progressing Goal: Ability to manage health-related needs will improve Outcome: Progressing   Problem: Metabolic: Goal: Ability to maintain appropriate glucose levels will improve Outcome: Progressing   Problem: Nutritional: Goal: Maintenance of adequate nutrition will improve Outcome: Progressing Goal: Progress toward achieving an optimal weight will improve Outcome: Progressing   Problem: Skin Integrity: Goal: Risk for impaired skin integrity will decrease Outcome: Progressing   Problem: Tissue Perfusion: Goal: Adequacy of tissue perfusion will improve Outcome: Progressing   Problem: Education: Goal: Knowledge of General Education information will improve Description: Including pain rating scale, medication(s)/side effects and non-pharmacologic comfort measures Outcome: Progressing   Problem: Health Behavior/Discharge Planning: Goal: Ability to manage health-related needs will improve Outcome: Progressing   Problem: Clinical Measurements: Goal: Ability to maintain clinical measurements within normal limits will improve Outcome: Progressing Goal: Will remain free from infection Outcome: Progressing Goal: Diagnostic test results will improve Outcome: Progressing Goal: Respiratory complications will improve Outcome: Progressing Goal: Cardiovascular complication will  be avoided Outcome: Progressing   Problem: Activity: Goal: Risk for activity intolerance will decrease Outcome: Progressing   Problem: Nutrition: Goal: Adequate nutrition will be maintained Outcome: Progressing   Problem: Coping: Goal: Level of anxiety will decrease Outcome: Progressing   Problem: Elimination: Goal: Will not experience complications related to bowel motility Outcome: Progressing Goal: Will not experience complications related to urinary retention Outcome: Progressing   Problem: Pain Management: Goal: General experience of comfort will improve Outcome: Progressing   Problem: Safety: Goal: Ability to remain free from injury will improve Outcome: Progressing   Problem: Skin Integrity: Goal: Risk for impaired skin integrity will decrease Outcome: Progressing   Problem: Education: Goal: Expressions of having a comfortable level of knowledge regarding the disease process will increase Outcome: Progressing   Problem: Coping: Goal: Ability to adjust to condition or change in health will improve Outcome: Progressing Goal: Ability to identify appropriate support needs will improve Outcome: Progressing   Problem: Health Behavior/Discharge Planning: Goal: Compliance with prescribed medication regimen will improve Outcome: Progressing   Problem: Medication: Goal: Risk for medication side effects will decrease Outcome: Progressing   Problem: Clinical Measurements: Goal: Complications related to the disease process, condition or treatment will be avoided or minimized Outcome: Progressing Goal: Diagnostic test results will improve Outcome: Progressing   Problem: Safety: Goal: Verbalization of understanding the information provided will improve Outcome: Progressing   Problem: Self-Concept: Goal: Level of anxiety will decrease Outcome: Progressing Goal: Ability to verbalize feelings about condition will improve Outcome: Progressing

## 2023-07-22 NOTE — Progress Notes (Signed)
 [facility] Progress Note Patient: Michelle [name] FMW:[id number] DOB: [birth year] DOA: 07/05/2023  DOS: the patient was seen and examined on 07/22/2023  Brief Hospital Course: PMH of HTN, dementia, HLD, type II DM, seizures, COPD, CKD, CAD, HFpEF present to the hospital with complaints of confusion and seizure. Was initially started on Keppra . Become significantly drowsy. Mentation improving after stopping Keppra .  Monitor.  Assessment and plan. Seizure disorder. Breakthrough seizures. Acute toxic (due to Keppra ) and metabolic (due to seizure) encephalopathy. Presents with confusion. Likely cause of seizures most likely UTI. EEG without any active seizures. Neurology was consulted. On Lamictal  and Vimpat . Was on Keppra  which is now discontinued. CT scan on admission negative for any acute abnormality.  But did have some SAH.  Depression. Mood disorder. On Seroquel .  Continue.   Drowsiness. Seems to have resolved. After stopping Keppra .   Right frontal SAH. Seen on admission.  Possible amyloid etiology.   CTA without any large vessel occlusion.  Neurology recommended no intervention.  No anticoagulation.  Currently not on chemical DVT prophylaxis.   HTN. Chronic HFpEF Blood pressure stable.  Monitor. Will attempt to optimize medications rather than use multiple medication.   CAD. On aspirin .   Klebsiella UTI. Treated with antibiotic.   Endometrial mass. Vaginal bleeding. On Provera . Outpatient follow-up with GYN.   Type II DM. Continue sliding scale insulin .  COPD. Stable.  HLD. Continue statin.  Calf pain. No significant edema seen. Pulses palpable. For now clinical monitoring.  Although patient remains at risk for DVT secondary to her inability to handle anticoagulation due to her [facility].  Subjective: Patient was drowsy in the morning.  Later in the afternoon was alert and awake.  Denied any acute complaint.  Reported some headache yesterday which is  currently resolved.  Physical Exam: General: in Mild distress, No Rash Cardiovascular: S1 and S2 Present, No Murmur Respiratory: Good respiratory effort, Bilateral Air entry present. No Crackles, No wheezes Abdomen: Bowel Sound present, No tenderness Extremities: No edema, patient reported calf pain although no tenderness on my exam. Neuro: Alert and oriented x3, no new focal deficit, no asterixis.  Data Reviewed: I have Reviewed nursing notes, Vitals, and Lab results. Since last encounter, pertinent lab results CBG   .   Disposition: Status is: Inpatient Remains inpatient appropriate because: Awaiting placement on Monday.  Place and maintain sequential compression device Start: 07/18/23 1624 Place and maintain sequential compression device Start: 07/12/23 1405 SCDs Start: 07/05/23 1423   Family Communication: Son at bedside. Level of care: Med-Surg   Vitals:   07/22/23 0850 07/22/23 0907 07/22/23 1214 07/22/23 1533  BP: (!) 159/82  (!) 110/44 137/66  Pulse: 80  63 65  Resp: 18  16 16   Temp: 98 F (36.7 C)  97.7 F (36.5 C) 98 F (36.7 C)  TempSrc: Oral  Oral Oral  SpO2: 99% 95% 100% 100%  Weight:      Height:         Author: Yetta Blanch, MD 07/22/2023 6:19 PM  Please look on [email or web address] to find out who is on call.

## 2023-07-22 NOTE — Plan of Care (Signed)
  No acute changes, awaiting placement.   Problem: Coping: Goal: Ability to adjust to condition or change in health will improve Outcome: Not Met (add Reason)   Problem: Education: Goal: Ability to describe self-care measures that may prevent or decrease complications (Diabetes Survival Skills Education) will improve Outcome: Not Met (add Reason)   Problem: Metabolic: Goal: Ability to maintain appropriate glucose levels will improve Outcome: Not Met (add Reason)   Problem: Skin Integrity: Goal: Risk for impaired skin integrity will decrease Outcome: Not Met (add Reason)   Problem: Education: Goal: Knowledge of General Education information will improve Description: Including pain rating scale, medication(s)/side effects and non-pharmacologic comfort measures Outcome: Not Met (add Reason)   Problem: Tissue Perfusion: Goal: Adequacy of tissue perfusion will improve Outcome: Not Met (add Reason)   Problem: Elimination: Goal: Will not experience complications related to bowel motility Outcome: Not Met (add Reason) Goal: Will not experience complications related to urinary retention Outcome: Not Met (add Reason)   Problem: Pain Management: Goal: General experience of comfort will improve Outcome: Not Met (add Reason)   Problem: Safety: Goal: Ability to remain free from injury will improve Outcome: Not Met (add Reason)

## 2023-07-23 DIAGNOSIS — R569 Unspecified convulsions: Secondary | ICD-10-CM | POA: Diagnosis not present

## 2023-07-23 DIAGNOSIS — I609 Nontraumatic subarachnoid hemorrhage, unspecified: Secondary | ICD-10-CM | POA: Diagnosis not present

## 2023-07-23 LAB — GLUCOSE, CAPILLARY
Glucose-Capillary: 135 mg/dL — ABNORMAL HIGH (ref 70–99)
Glucose-Capillary: 136 mg/dL — ABNORMAL HIGH (ref 70–99)
Glucose-Capillary: 145 mg/dL — ABNORMAL HIGH (ref 70–99)
Glucose-Capillary: 146 mg/dL — ABNORMAL HIGH (ref 70–99)
Glucose-Capillary: 199 mg/dL — ABNORMAL HIGH (ref 70–99)
Glucose-Capillary: 201 mg/dL — ABNORMAL HIGH (ref 70–99)

## 2023-07-23 LAB — BASIC METABOLIC PANEL
Anion gap: 10 (ref 5–15)
BUN: 36 mg/dL — ABNORMAL HIGH (ref 8–23)
CO2: 20 mmol/L — ABNORMAL LOW (ref 22–32)
Calcium: 9.8 mg/dL (ref 8.9–10.3)
Chloride: 107 mmol/L (ref 98–111)
Creatinine, Ser: 1.02 mg/dL — ABNORMAL HIGH (ref 0.44–1.00)
GFR, Estimated: 53 mL/min — ABNORMAL LOW (ref >60)
Glucose, Bld: 130 mg/dL — ABNORMAL HIGH (ref 70–99)
Potassium: 5 mmol/L (ref 3.5–5.1)
Sodium: 137 mmol/L (ref 135–145)

## 2023-07-23 LAB — MAGNESIUM: Magnesium: 1.7 mg/dL (ref 1.7–2.4)

## 2023-07-23 NOTE — Progress Notes (Signed)
 PROGRESS NOTE    Michelle [name]  FMW:[id number] DOB: [birth year] DOA: 07/05/2023 PCP: Katrinka Garnette KIDD, MD    Brief Narrative:  PMH of HTN, dementia, HLD, type II DM, seizures, COPD, CKD, CAD, HFpEF present to the hospital with complaints of confusion and seizure. Was initially started on Keppra . Become significantly drowsy. Mentation improving after stopping Keppra . Continue to try to go to [facility], declined by insurance.  Now waiting to go to SNF.  Subjective: Patient seen and examined.  Alert awake.  Denies any complaints. Assessment & Plan:   Seizure disorder. Breakthrough seizures. Acute toxic (due to Keppra ) and metabolic (due to seizure) encephalopathy. Presented with confusion. Likely cause of seizures is most likely UTI. EEG without any active seizures. Neurology was consulted. On Lamictal  and Vimpat . Was on Keppra  which is now discontinued. CT scan on admission negative for any acute abnormality.  But did have some SAH.   Depression. Mood disorder. On Seroquel .  Continue.   Drowsiness. Improved after stopping Keppra .   Right frontal SAH. Seen on admission.  Possible amyloid etiology.   CTA without any large vessel occlusion.  Neurology recommended no intervention.  No anticoagulation.  Currently not on chemical DVT prophylaxis.   HTN. Chronic HFpEF Blood pressure stable.  Monitor. Will attempt to optimize medications rather than use multiple medication.   CAD. On aspirin .   Klebsiella UTI. Treated with antibiotic.  Completed therapy.   Endometrial mass. Vaginal bleeding. On Provera . Outpatient follow-up with GYN.   Type II DM. Continue sliding scale insulin .   COPD. Stable.   HLD. Continue statin.   DVT prophylaxis: Place and maintain sequential compression device Start: 07/18/23 1624 Place and maintain sequential compression device Start: 07/12/23 1405 SCDs Start: 07/05/23 1423   Code Status: DNR with intervention Family Communication:  None at the bedside Disposition Plan: Status is: Inpatient Remains inpatient appropriate because: Waiting to go to SNF     Consultants:  Neurology  Procedures:  None  Antimicrobials:  Completed     Objective: Vitals:   07/22/23 1533 07/22/23 1956 07/23/23 0720 07/23/23 0822  BP: 137/66  125/66   Pulse: 65 62 71 71  Resp: 16 16 17 16   Temp: 98 F (36.7 C)  98.1 F (36.7 C)   TempSrc: Oral  Oral   SpO2: 100% 98% 100% 100%  Weight:      Height:        Intake/Output Summary (Last 24 hours) at 07/23/2023 1149 Last data filed at 07/23/2023 0200 Gross per 24 hour  Intake 240 ml  Output 1400 ml  Net -1160 ml   Filed Weights   07/05/23 0905  Weight: 75.8 kg    Examination:  General exam: Appears calm and comfortable  Respiratory system: Clear to auscultation. Respiratory effort normal. Cardiovascular system: S1 & S2 heard, RRR.  Gastrointestinal system: Soft.  Nontender.  Bowel sound present. Central nervous system: Alert and awake.  Oriented to herself and family.  Not oriented to time and place. Extremities: Diffuse generalized weakness.    Data Reviewed: I have personally reviewed following labs and imaging studies  CBC: Recent Labs  Lab 07/17/23 0621 07/21/23 0957  WBC 8.1 5.8  NEUTROABS 4.5  --   HGB 13.6 13.3  HCT 42.3 41.6  MCV 84.3 84.7  PLT 247 236   Basic Metabolic Panel: Recent Labs  Lab 07/17/23 0621 07/21/23 0957 07/23/23 0605  NA 139 136 137  K 4.3 4.3 5.0  CL 104 102 107  CO2 24 26  20*  GLUCOSE 121* 180* 130*  BUN 28* 33* 36*  CREATININE 0.93 0.99 1.02*  CALCIUM  10.2 10.0 9.8  MG  --  1.7 1.7   GFR: Estimated Creatinine Clearance: 38.8 mL/min (A) (by C-G formula based on SCr of 1.02 mg/dL (H)). Liver Function Tests: No results for input(s): AST, ALT, ALKPHOS, BILITOT, PROT, ALBUMIN in the last 168 hours. No results for input(s): LIPASE, AMYLASE in the last 168 hours. No results for input(s): AMMONIA in the  last 168 hours. Coagulation Profile: No results for input(s): INR, PROTIME in the last 168 hours. Cardiac Enzymes: No results for input(s): CKTOTAL, CKMB, CKMBINDEX, TROPONINI in the last 168 hours. BNP (last 3 results) No results for input(s): PROBNP in the last 8760 hours. HbA1C: No results for input(s): HGBA1C in the last 72 hours. CBG: Recent Labs  Lab 07/22/23 1934 07/22/23 2233 07/23/23 0010 07/23/23 0334 07/23/23 0718  GLUCAP 132* 187* 136* 146* 145*   Lipid Profile: No results for input(s): CHOL, HDL, LDLCALC, TRIG, CHOLHDL, LDLDIRECT in the last 72 hours. Thyroid  Function Tests: No results for input(s): TSH, T4TOTAL, FREET4, T3FREE, THYROIDAB in the last 72 hours. Anemia Panel: No results for input(s): VITAMINB12, FOLATE, FERRITIN, TIBC, IRON, RETICCTPCT in the last 72 hours. Sepsis Labs: No results for input(s): PROCALCITON, LATICACIDVEN in the last 168 hours.  No results found for this or any previous visit (from the past 240 hours).       Radiology Studies: No results found.      Scheduled Meds:  amLODipine   5 mg Oral Daily   carvedilol   12.5 mg Oral BID   enalapril   20 mg Oral QHS   fenofibrate   54 mg Oral Daily   icosapent  Ethyl  1 g Oral BID WC   And   icosapent  Ethyl  2 g Oral QHS   insulin  aspart  0-5 Units Subcutaneous QHS   insulin  aspart  0-9 Units Subcutaneous TID WC   lamoTRIgine   50 mg Oral BID   medroxyPROGESTERone   10 mg Oral Daily   mometasone -formoterol   2 puff Inhalation BID   nortriptyline   10 mg Oral QHS   pantoprazole   40 mg Oral Daily   polyethylene glycol  17 g Oral Daily   QUEtiapine   12.5 mg Oral QPM   rosuvastatin   40 mg Oral Daily   senna-docusate  1 tablet Oral BID   sertraline   100 mg Oral QHS   sodium chloride  flush  3 mL Intravenous Q12H   umeclidinium bromide   1 puff Inhalation Daily   Continuous Infusions:   LOS: 17 days    Time spent: 35  minutes    Renato Applebaum, MD [facility]

## 2023-07-23 NOTE — Progress Notes (Signed)
   Palliative Medicine Inpatient Follow Up Note HPI: Michelle [name] is 89 y.o. female with dementia, wheelchair-bound at baseline, hypertension, hyperlipidemia, diabetes, seizure disorder, COPD, CKD stage III, CAD status post stenting, epilepsy, diastolic CHF, anxiety, anemia, who presented with altered mental status and seizure, found by family.    Palliative care has been asked to get involved for further goals of care conversations.    Today's Discussion 07/23/2023  *Please note that this is a verbal dictation therefore any spelling or grammatical errors are due to the Dragon [employer] One system interpretation.  Chart reviewed inclusive of vital signs, progress notes, laboratory results, and diagnostic images.  I met with Michelle [name] at bedside this morning. She is awake and alert. She shares that she is starting to feel better overall. Denies pain or shortness of breath. Discussed the importance of mobility. Michelle [name] asks when she will be leaving, I shared that will be up to the primary medical team.  Plan for transition to [facility] tomorrow.  Questions and concerns addressed/Palliative Support Provided.   Objective Assessment: Vital Signs Vitals:   07/23/23 0822 07/23/23 1154  BP:  (!) 99/49  Pulse: 71 64  Resp: 16 15  Temp:  98 F (36.7 C)  SpO2: 100% 100%    Intake/Output Summary (Last 24 hours) at 07/23/2023 1211 Last data filed at 07/23/2023 0200 Gross per 24 hour  Intake 240 ml  Output 1400 ml  Net -1160 ml    Last Weight  Most recent update: 07/05/2023  9:05 AM    Weight  75.8 kg (167 lb 1.7 oz)            Gen:  Elderly Caucasian F in NAD HEENT: moist mucous membranes CV: Regular rate and rhythm  PULM:  On RA, breathing is even and nonlabored ABD: soft/nontender  EXT: No edema  Neuro: Alert and oriented to self  SUMMARY OF RECOMMENDATIONS   DNAR/DNI   Plan for transition to [facility] place tomorrow with OP Palliative support   Ongoing incremental PMT  support  MDM: Low ______________________________________________________________________________________ Michelle [name] Palliative Medicine Team Team Cell Phone: [phone] Please utilize secure chat with additional questions, if there is no response within 30 minutes please call the above phone number  Palliative Medicine Team providers are available by phone from 7am to 7pm daily and can be reached through the team cell phone.  Should this patient require assistance outside of these hours, please call the patient's attending physician.

## 2023-07-23 NOTE — Plan of Care (Signed)
  Problem: Health Behavior/Discharge Planning: Goal: Ability to identify and utilize available resources and services will improve Outcome: Progressing   Problem: Metabolic: Goal: Ability to maintain appropriate glucose levels will improve Outcome: Progressing   Problem: Nutritional: Goal: Maintenance of adequate nutrition will improve Outcome: Progressing   Problem: Skin Integrity: Goal: Risk for impaired skin integrity will decrease Outcome: Progressing   Problem: Clinical Measurements: Goal: Ability to maintain clinical measurements within normal limits will improve Outcome: Progressing

## 2023-07-24 DIAGNOSIS — R278 Other lack of coordination: Secondary | ICD-10-CM | POA: Diagnosis not present

## 2023-07-24 DIAGNOSIS — I5032 Chronic diastolic (congestive) heart failure: Secondary | ICD-10-CM | POA: Diagnosis not present

## 2023-07-24 DIAGNOSIS — I609 Nontraumatic subarachnoid hemorrhage, unspecified: Secondary | ICD-10-CM | POA: Diagnosis not present

## 2023-07-24 DIAGNOSIS — J449 Chronic obstructive pulmonary disease, unspecified: Secondary | ICD-10-CM | POA: Diagnosis not present

## 2023-07-24 DIAGNOSIS — Z743 Need for continuous supervision: Secondary | ICD-10-CM | POA: Diagnosis not present

## 2023-07-24 DIAGNOSIS — G40909 Epilepsy, unspecified, not intractable, without status epilepticus: Secondary | ICD-10-CM | POA: Diagnosis not present

## 2023-07-24 DIAGNOSIS — R41841 Cognitive communication deficit: Secondary | ICD-10-CM | POA: Diagnosis not present

## 2023-07-24 DIAGNOSIS — R296 Repeated falls: Secondary | ICD-10-CM | POA: Diagnosis not present

## 2023-07-24 DIAGNOSIS — Z7401 Bed confinement status: Secondary | ICD-10-CM | POA: Diagnosis not present

## 2023-07-24 DIAGNOSIS — I6900 Unspecified sequelae of nontraumatic subarachnoid hemorrhage: Secondary | ICD-10-CM | POA: Diagnosis not present

## 2023-07-24 DIAGNOSIS — N390 Urinary tract infection, site not specified: Secondary | ICD-10-CM | POA: Diagnosis not present

## 2023-07-24 DIAGNOSIS — Z515 Encounter for palliative care: Secondary | ICD-10-CM | POA: Diagnosis not present

## 2023-07-24 DIAGNOSIS — R2689 Other abnormalities of gait and mobility: Secondary | ICD-10-CM | POA: Diagnosis not present

## 2023-07-24 DIAGNOSIS — I10 Essential (primary) hypertension: Secondary | ICD-10-CM | POA: Diagnosis not present

## 2023-07-24 DIAGNOSIS — R000 Tachycardia, unspecified: Secondary | ICD-10-CM | POA: Diagnosis not present

## 2023-07-24 DIAGNOSIS — E119 Type 2 diabetes mellitus without complications: Secondary | ICD-10-CM | POA: Diagnosis not present

## 2023-07-24 DIAGNOSIS — M6281 Muscle weakness (generalized): Secondary | ICD-10-CM | POA: Diagnosis not present

## 2023-07-24 DIAGNOSIS — R531 Weakness: Secondary | ICD-10-CM | POA: Diagnosis not present

## 2023-07-24 DIAGNOSIS — E1165 Type 2 diabetes mellitus with hyperglycemia: Secondary | ICD-10-CM | POA: Diagnosis not present

## 2023-07-24 DIAGNOSIS — Z7189 Other specified counseling: Secondary | ICD-10-CM | POA: Diagnosis not present

## 2023-07-24 DIAGNOSIS — F331 Major depressive disorder, recurrent, moderate: Secondary | ICD-10-CM | POA: Diagnosis not present

## 2023-07-24 DIAGNOSIS — R569 Unspecified convulsions: Secondary | ICD-10-CM | POA: Diagnosis not present

## 2023-07-24 DIAGNOSIS — F03B3 Unspecified dementia, moderate, with mood disturbance: Secondary | ICD-10-CM | POA: Diagnosis not present

## 2023-07-24 LAB — GLUCOSE, CAPILLARY
Glucose-Capillary: 148 mg/dL — ABNORMAL HIGH (ref 70–99)
Glucose-Capillary: 163 mg/dL — ABNORMAL HIGH (ref 70–99)
Glucose-Capillary: 187 mg/dL — ABNORMAL HIGH (ref 70–99)

## 2023-07-24 MED ORDER — POLYETHYLENE GLYCOL 3350 17 G PO PACK: 17.0000 g | PACK | Freq: Every day | ORAL | 0 refills | Status: DC

## 2023-07-24 MED ORDER — QUETIAPINE FUMARATE 25 MG PO TABS: 12.5000 mg | ORAL_TABLET | Freq: Every evening | ORAL | Status: DC

## 2023-07-24 MED ORDER — AMLODIPINE BESYLATE 5 MG PO TABS: 5.0000 mg | ORAL_TABLET | Freq: Every day | ORAL | Status: DC

## 2023-07-24 MED ORDER — LORAZEPAM 0.5 MG PO TABS: 0.5000 mg | ORAL_TABLET | Freq: Four times a day (QID) | ORAL | Status: DC | PRN

## 2023-07-24 NOTE — Discharge Summary (Signed)
 Physician Discharge Summary  CALEA HRIBAR FMW:[id number] DOB: 1932/08/12 DOA: 07/05/2023  PCP: Katrinka Garnette KIDD, MD  Admit date: 07/05/2023 Discharge date: 07/24/2023  Admitted From: Home Disposition: Skilled nursing facility  Recommendations for Outpatient Follow-up:  Follow up with PCP in 1-2 weeks after discharge Consult palliative care to follow-up at skilled nursing facility.  Discharge Condition: Fair CODE STATUS: DNR with intervention Diet recommendation: Dysphagia 3 diet, aspiration precautions, nutritional supplements  Discharge summary: PMH of HTN, dementia, HLD, type II DM, seizures, COPD, CKD, CAD, HFpEF presented to the hospital with complaints of confusion and seizure. Was initially started on Keppra . Became significantly drowsy. Mentation improving after stopping Keppra .  Patient is still remains in and out of delirium. Also followed by palliative care.  Family insisted on patient going to [facility], appeal failed.  She will go to SNF for inpatient therapies today.  Patient with progressive dementia, intermittent delirium.  Very high risk of readmission.  She may benefit with ongoing palliative and hospice discussions.   Assessment & Plan:   Seizure disorder. Breakthrough seizures. Acute toxic (due to Keppra ) and metabolic (due to seizure) encephalopathy. Presented with confusion. Also treated for UTI. EEG without any active seizures. Neurology was consulted. Previously on Lamictal  and Keppra .  Keppra  was discontinued.  Continue Lamictal . CT scan on admission negative for any acute abnormality.  But did have some SAH.   Depression. Mood disorder. On Seroquel .  Continue.   Drowsiness. Improved after stopping Keppra .   Right frontal SAH. Seen on admission.  Possible amyloid etiology.   CTA without any large vessel occlusion.  Neurology recommended no intervention.  No anticoagulation.  On aspirin  Plavix  before coming to the hospital.  Now on aspirin  alone.    HTN. Chronic HFpEF Blood pressure stable.  Monitor.   CAD. On aspirin .  Plavix  discontinued.  Already on a statin.   Klebsiella UTI. Treated with antibiotic.  Completed therapy.   Endometrial mass. Vaginal bleeding. On Provera . Outpatient follow-up with GYN.   Type II DM. Continue insulin .   COPD. Stable.  Goal of care: Seen by palliative care while in the hospital.  Remains in poor clinical status.  Now with intermittent delirium.  Poor participation to therapies.  Stable to transition to SNF today to attempt working with therapies.  Patient may need long-term care in the future and this was communicated with patient's son.   Discharge Diagnoses:  Active Problems:   Type 2 diabetes mellitus without complication, with long-term current use of insulin  (HCC)   Hyperlipidemia associated with type 2 diabetes mellitus (HCC)   Essential hypertension   COPD (chronic obstructive pulmonary disease) (HCC)   Chronic diastolic CHF (congestive heart failure) (HCC)   CAD- RCA PCI '90s, STEMI-RCA DES 02/18/14   Anxiety state   CKD (chronic kidney disease), stage III (HCC)   Anemia, iron deficiency   Dementia (HCC)   Epilepsy (HCC)   DNR (do not resuscitate)/DNI(Do Not Intubate)   Subarachnoid hemorrhage (HCC)   Seizure (HCC)   Seizures (HCC)    Discharge Instructions  Discharge Instructions     Diet general   Complete by: As directed    Diet Dysphagia 3 . Aspiration precautions   Increase activity slowly   Complete by: As directed       Allergies as of 07/24/2023       Reactions   Haldol  [haloperidol ]    Codeine Sulfate Nausea Only   Morphine  Sulfate Nausea Only        Medication List  STOP taking these medications    clopidogrel  75 MG tablet Commonly known as: PLAVIX    hydrALAZINE  25 MG tablet Commonly known as: APRESOLINE        TAKE these medications    acetaminophen  650 MG CR tablet Commonly known as: TYLENOL  Take 1,300 mg by mouth 3 (three)  times daily.   albuterol  (2.5 MG/3ML) 0.083% nebulizer solution Commonly known as: PROVENTIL  Take 2.5 mg by nebulization every 6 (six) hours as needed for wheezing or shortness of breath. What changed: Another medication with the same name was removed. Continue taking this medication, and follow the directions you see here.   amLODipine  5 MG tablet Commonly known as: NORVASC  Take 1 tablet (5 mg total) by mouth daily. What changed:  medication strength how much to take how to take this when to take this additional instructions   aspirin  81 MG chewable tablet Chew 81 mg by mouth daily.   carvedilol  12.5 MG tablet Commonly known as: COREG  Take 1 tablet (12.5 mg total) by mouth 2 (two) times daily.   cholecalciferol  25 MCG (1000 UNIT) tablet Commonly known as: VITAMIN D3 Take 1,000 Units by mouth daily with breakfast.   enalapril  20 MG tablet Commonly known as: VASOTEC  Take 1 tablet (20 mg total) by mouth at bedtime.   fenofibrate  micronized 134 MG capsule Commonly known as: LOFIBRA TAKE 1 CAPSULE EVERY DAY   ferrous sulfate  325 (65 FE) MG tablet Twice a week What changed:  how much to take how to take this when to take this additional instructions   fluticasone -salmeterol 250-50 MCG/ACT Aepb Commonly known as: ADVAIR INHALE 1 PUFF TWICE DAILY What changed: when to take this   furosemide  20 MG tablet Commonly known as: LASIX  Take 1 tablet (20 mg total) by mouth as needed for fluid or edema.   icosapent  Ethyl 1 g capsule Commonly known as: Vascepa  Take 2 capsules (2 g total) by mouth 2 (two) times daily. What changed:  how much to take when to take this additional instructions   lamoTRIgine  25 MG tablet Commonly known as: LAMICTAL  Take 2 tablets (50 mg total) by mouth 2 (two) times daily.   loperamide  2 MG tablet Commonly known as: IMODIUM  A-D Take 2-4 mg by mouth 2 (two) times daily as needed for diarrhea or loose stools.   loratadine  10 MG  tablet Commonly known as: CLARITIN  Take 10 mg by mouth every other day.   medroxyPROGESTERone  10 MG tablet Commonly known as: Provera  Take 1 tablet (10 mg total) by mouth daily. What changed: when to take this   nortriptyline  10 MG capsule Commonly known as: PAMELOR  Take 10 mg by mouth at bedtime.   pantoprazole  40 MG tablet Commonly known as: PROTONIX  TAKE 1 TABLET EVERY DAY   polyethylene glycol 17 g packet Commonly known as: MIRALAX  / GLYCOLAX  Take 17 g by mouth daily.   QUEtiapine  25 MG tablet Commonly known as: SEROQUEL  Take 0.5 tablets (12.5 mg total) by mouth every evening. Between 1830 and 1900 What changed: how much to take   rosuvastatin  40 MG tablet Commonly known as: CRESTOR  Take 1 tablet (40 mg total) by mouth daily.   sertraline  50 MG tablet Commonly known as: ZOLOFT  TAKE 2 TABLETS AT BEDTIME   Spiriva  HandiHaler 18 MCG inhalation capsule Generic drug: tiotropium PLACE 1 CAPSULE INTO HANDIHALER AND INHALE THE CONTENTS DAILY   TRUEplus Lancets 33G Misc TEST BLOOD SUGAR UP TO FOUR TIMES DAILY AS DIRECTED        Follow-up Information  [facility]  Follow up.   Why: agency for home health        Katrinka Garnette KIDD, MD Follow up in 1 week(s).   Specialty: Family Medicine Contact information: [address] [place] [address] [phone]                Allergies  Allergen Reactions   Haldol  [Haloperidol ]    Codeine Sulfate Nausea Only   Morphine  Sulfate Nausea Only    Consultations: Palliative care Neurology   Procedures/Studies: Overnight EEG with video Result Date: 07/06/2023 [name] Arlin KIDD, MD     07/07/2023 11:49 AM Patient Name: Michelle [name] MRN: [id number] Epilepsy Attending: Arlin KIDD [name] Referring Physician/Provider: everitt Clint Abbey Earle FORBES, NP Duration: 07/05/2023 1319 to 07/06/2023 1319  Patient history: 89yo F with history of recent admission for breakthrough seizures with prior  history of seizures, diabetes, COPD, modified Rankin score of 4 at baseline, presenting with concern for depressed mental status after a staring spell this morning concerning for seizure. EEG to evaluate for seizure  Level of alertness: Awake, asleep  AEDs during EEG study: LTG, LEV  Technical aspects: This EEG study was done with scalp electrodes positioned according to the 10-20 International system of electrode placement. Electrical activity was reviewed with band pass filter of 1-70Hz , sensitivity of 7 uV/mm, display speed of 23mm/sec with a 60Hz  notched filter applied as appropriate. EEG data were recorded continuously and digitally stored.  Video monitoring was available and reviewed as appropriate.  Description: The posterior dominant rhythm consists of 8 Hz activity of moderate voltage (25-35 uV) seen predominantly in posterior head regions, symmetric and reactive to eye opening and eye closing. EEG showed intermittent generalized 3 to 6 Hz theta-delta slowing as well as generalized periodic discharges with triphasic morphology at 1hz , when awake/stimulated. Hyperventilation and photic stimulation were not performed.    ABNORMALITY - Periodic discharges with triphasic morphology, generalized - Intermittent slow, generalized  IMPRESSION: This study is suggestive of mild diffuse encephalopathy, likely secondary to toxic-metabolic causes. No seizures or epileptiform discharges were seen throughout the recording.  Michelle [name]   CT ANGIO HEAD W OR WO CONTRAST Result Date: 07/05/2023 CLINICAL DATA:  Subarachnoid hemorrhage and seizures EXAM: CT ANGIOGRAPHY HEAD TECHNIQUE: Multidetector CT imaging of the head was performed using the standard protocol during bolus administration of intravenous contrast. Multiplanar CT image reconstructions and MIPs were obtained to evaluate the vascular anatomy. RADIATION DOSE REDUCTION: This exam was performed according to the departmental dose-optimization program which  includes automated exposure control, adjustment of the mA and/or kV according to patient size and/or use of iterative reconstruction technique. CONTRAST:  75mL OMNIPAQUE  IOHEXOL  350 MG/ML SOLN COMPARISON:  CTA head neck 07/05/2023 at 12:31 p.m. FINDINGS: POSTERIOR CIRCULATION: Vertebral arteries are normal. No proximal occlusion of the anterior or inferior cerebellar arteries. Basilar artery is normal. Superior cerebellar arteries are normal. Posterior cerebral arteries are normal. ANTERIOR CIRCULATION: Intracranial internal carotid arteries are normal. Anterior cerebral arteries are normal. Middle cerebral arteries are normal. Anatomic Variants: None Venous sinuses: As permitted by contrast timing, patent. Other: Redemonstration of small volume subarachnoid hemorrhage. Review of the MIP images confirms the above findings. IMPRESSION: 1. No intracranial aneurysm or vascular malformation. 2. Redemonstration of small volume subarachnoid hemorrhage. Electronically Signed   By: Franky Stanford M.D.   On: 07/05/2023 22:50   CT ANGIO HEAD NECK W WO CM Result Date: 07/05/2023 CLINICAL DATA:  Subarachnoid hemorrhage EXAM: CT ANGIOGRAPHY HEAD AND NECK  WITH AND WITHOUT CONTRAST TECHNIQUE: Multidetector CT imaging of the head and neck was performed using the standard protocol during bolus administration of intravenous contrast. Multiplanar CT image reconstructions and MIPs were obtained to evaluate the vascular anatomy. Carotid stenosis measurements (when applicable) are obtained utilizing NASCET criteria, using the distal internal carotid diameter as the denominator. RADIATION DOSE REDUCTION: This exam was performed according to the departmental dose-optimization program which includes automated exposure control, adjustment of the mA and/or kV according to patient size and/or use of iterative reconstruction technique. CONTRAST:  50mL OMNIPAQUE  IOHEXOL  350 MG/ML SOLN COMPARISON:  06/14/2023 CTA head and neck, correlation is  also made with 07/05/2023 CT head FINDINGS: CT HEAD FINDINGS For noncontrast findings, please see same day CT head. CTA NECK FINDINGS Evaluation is limited by motion artifact. Aortic arch: Standard branching. Imaged portion shows no evidence of aneurysm or dissection. No significant stenosis of the major arch vessel origins. Aortic atherosclerosis with ulcerated plaque. Right carotid system: No evidence of dissection, occlusion, or hemodynamically significant stenosis (greater than 50%). Left carotid system: No evidence of dissection, occlusion, or hemodynamically significant stenosis (greater than 50%). Vertebral arteries: No evidence of dissection, occlusion, or hemodynamically significant stenosis (greater than 50%). Skeleton: No acute osseous abnormality. Degenerative changes in the cervical spine. Other neck: No acute finding. Upper chest: No focal pulmonary opacity or pleural effusion. Apical pleural-parenchymal scarring. Review of the MIP images confirms the above findings CTA HEAD FINDINGS Evaluation is significantly motion degraded. Within this limitation, the intracranial carotid arteries are patent to the termini. The proximal MCAs and ACAs appear patent, although evaluation of the distal vessels is not possible. The vertebral arteries are patent to the vertebrobasilar junction without significant stenosis. The PICAs appear patent proximally. The basilar artery is patent to its distal aspect without significant stenosis. The PCA s are patent proximally. Evaluation of more distal PCAs is not possible. IMPRESSION: 1. Evaluation of the intracranial vasculature is significantly motion degraded. Within this limitation, no large vessel occlusion or significant stenosis in the proximal vessels of the head. 2. Evaluation of the neck vasculature is mildly motion degraded. Within this limitation, no hemodynamically significant stenosis. 3. Aortic atherosclerosis with ulcerated plaque. Aortic Atherosclerosis  (ICD10-I70.0). Electronically Signed   By: Donald Campion M.D.   On: 07/05/2023 13:21   DG Chest Portable 1 View Result Date: 07/05/2023 CLINICAL DATA:  Altered mental status.  Seizure. EXAM: PORTABLE CHEST 1 VIEW COMPARISON:  06/14/2023. FINDINGS: Low lung volume. There are mildly increased interstitial markings with bilateral hilar predominance, similar to the prior study. No overt pulmonary edema. Bilateral lung fields are otherwise clear. No acute consolidation or lung collapse. Bilateral costophrenic angles are clear. Stable cardio-mediastinal silhouette. No acute osseous abnormalities. The soft tissues are within normal limits. IMPRESSION: *Mild central vascular congestion. No frank pulmonary edema. No acute consolidation or lung collapse. Electronically Signed   By: Ree Molt M.D.   On: 07/05/2023 11:14   CT HEAD WO CONTRAST ( ) Result Date: 07/05/2023 CLINICAL DATA:  Polytrauma, blunt EXAM: CT HEAD WITHOUT CONTRAST CT CERVICAL SPINE WITHOUT CONTRAST TECHNIQUE: Multidetector CT imaging of the head and cervical spine was performed following the standard protocol without intravenous contrast. Multiplanar CT image reconstructions of the cervical spine were also generated. RADIATION DOSE REDUCTION: This exam was performed according to the departmental dose-optimization program which includes automated exposure control, adjustment of the mA and/or kV according to patient size and/or use of iterative reconstruction technique. COMPARISON:  CT Head and C Spine 03/01/23 FINDINGS:  Limitations: Motion degraded exam CT HEAD FINDINGS Brain: Small volume subarachnoid hemorrhage in the right frontal lobe (series 7, image 27). No hydrocephalus. No evidence of intraventricular hemorrhage. No CT evidence of an acute cortical infarct. No mass effect. No mass lesion. Vascular: No hyperdense vessel or unexpected calcification. Skull: Normal. Negative for fracture or focal lesion. Sinuses/Orbits: No middle ear or  mastoid effusion. Paranasal sinuses are clear. Orbits are unremarkable. Other: None. CT CERVICAL SPINE FINDINGS Alignment: Grade 1 anterolisthesis of C6 on C7 Skull base and vertebrae: No acute fracture. No primary bone lesion or focal pathologic process. Unchanged likely degenerative fusion of C6-C7 Soft tissues and spinal canal: No prevertebral fluid or swelling. No visible canal hematoma. Disc levels:  No CT evidence of high-grade spinal canal stenosis Upper chest: Negative. Other: 1.2 cm left thyroid  nodule. This is not meet size criteria for further imaging workup IMPRESSION: 1. Small volume subarachnoid hemorrhage in the right frontal lobe. 2. No acute fracture or traumatic subluxation of the cervical spine. Electronically Signed   By: Lyndall Gore M.D.   On: 07/05/2023 10:30   CT Cervical Spine Wo Contrast Result Date: 07/05/2023 CLINICAL DATA:  Polytrauma, blunt EXAM: CT HEAD WITHOUT CONTRAST CT CERVICAL SPINE WITHOUT CONTRAST TECHNIQUE: Multidetector CT imaging of the head and cervical spine was performed following the standard protocol without intravenous contrast. Multiplanar CT image reconstructions of the cervical spine were also generated. RADIATION DOSE REDUCTION: This exam was performed according to the departmental dose-optimization program which includes automated exposure control, adjustment of the mA and/or kV according to patient size and/or use of iterative reconstruction technique. COMPARISON:  CT Head and C Spine 03/01/23 FINDINGS: Limitations: Motion degraded exam CT HEAD FINDINGS Brain: Small volume subarachnoid hemorrhage in the right frontal lobe (series 7, image 27). No hydrocephalus. No evidence of intraventricular hemorrhage. No CT evidence of an acute cortical infarct. No mass effect. No mass lesion. Vascular: No hyperdense vessel or unexpected calcification. Skull: Normal. Negative for fracture or focal lesion. Sinuses/Orbits: No middle ear or mastoid effusion. Paranasal sinuses  are clear. Orbits are unremarkable. Other: None. CT CERVICAL SPINE FINDINGS Alignment: Grade 1 anterolisthesis of C6 on C7 Skull base and vertebrae: No acute fracture. No primary bone lesion or focal pathologic process. Unchanged likely degenerative fusion of C6-C7 Soft tissues and spinal canal: No prevertebral fluid or swelling. No visible canal hematoma. Disc levels:  No CT evidence of high-grade spinal canal stenosis Upper chest: Negative. Other: 1.2 cm left thyroid  nodule. This is not meet size criteria for further imaging workup IMPRESSION: 1. Small volume subarachnoid hemorrhage in the right frontal lobe. 2. No acute fracture or traumatic subluxation of the cervical spine. Electronically Signed   By: Lyndall Gore M.D.   On: 07/05/2023 10:30   (Echo, Carotid, EGD, Colonoscopy, ERCP)    Subjective: Patient seen in the morning rounds.  She was quiet and slightly impulsive.  Working with physical therapy and needed lot of effort to get out of the bed with instruments.  Nursing reported intermittent confusion.   Discharge Exam: Vitals:   07/24/23 0342 07/24/23 0859  BP: (!) 142/75 (!) 128/58  Pulse: 66 78  Resp: 16 16  Temp: 98.6 F (37 C) 99.3 F (37.4 C)  SpO2: 100% 93%   Vitals:   07/23/23 2106 07/24/23 0026 07/24/23 0342 07/24/23 0859  BP:  (!) 146/49 (!) 142/75 (!) 128/58  Pulse:  78 66 78  Resp:  16 16 16   Temp:  99.4 F (37.4 C) 98.6  F (37 C) 99.3 F (37.4 C)  TempSrc:  Oral Oral Oral  SpO2: 100% 97% 100% 93%  Weight:      Height:        General: Pt is alert, awake, not in acute distress Frail and debilitated.  Chronically sick looking.  Diffuse generalized weakness.  She is alert and awake.  Oriented to herself.  Not oriented to time place and person.  Not oriented to situation. Cardiovascular: RRR, S1/S2 +, no rubs, no gallops Respiratory: CTA bilaterally, no wheezing, no rhonchi Abdominal: Soft, NT, ND, bowel sounds + Extremities: no edema, no cyanosis    The  results of significant diagnostics from this hospitalization (including imaging, microbiology, ancillary and laboratory) are listed below for reference.     Microbiology: No results found for this or any previous visit (from the past 240 hours).   Labs: BNP (last 3 results) Recent Labs    02/16/23 1045 02/18/23 0212  BNP 1,115.4* 670.9*   Basic Metabolic Panel: Recent Labs  Lab 07/21/23 0957 07/23/23 0605  NA 136 137  K 4.3 5.0  CL 102 107  CO2 26 20*  GLUCOSE 180* 130*  BUN 33* 36*  CREATININE 0.99 1.02*  CALCIUM  10.0 9.8  MG 1.7 1.7   Liver Function Tests: No results for input(s): AST, ALT, ALKPHOS, BILITOT, PROT, ALBUMIN in the last 168 hours. No results for input(s): LIPASE, AMYLASE in the last 168 hours. No results for input(s): AMMONIA in the last 168 hours. CBC: Recent Labs  Lab 07/21/23 0957  WBC 5.8  HGB 13.3  HCT 41.6  MCV 84.7  PLT 236   Cardiac Enzymes: No results for input(s): CKTOTAL, CKMB, CKMBINDEX, TROPONINI in the last 168 hours. BNP: Invalid input(s): POCBNP CBG: Recent Labs  Lab 07/23/23 1153 07/23/23 1614 07/23/23 2152 07/24/23 0027 07/24/23 0627  GLUCAP 199* 201* 135* 163* 148*   D-Dimer No results for input(s): DDIMER in the last 72 hours. Hgb A1c No results for input(s): HGBA1C in the last 72 hours. Lipid Profile No results for input(s): CHOL, HDL, LDLCALC, TRIG, CHOLHDL, LDLDIRECT in the last 72 hours. Thyroid  function studies No results for input(s): TSH, T4TOTAL, T3FREE, THYROIDAB in the last 72 hours.  Invalid input(s): FREET3 Anemia work up No results for input(s): VITAMINB12, FOLATE, FERRITIN, TIBC, IRON, RETICCTPCT in the last 72 hours. Urinalysis    Component Value Date/Time   COLORURINE YELLOW 07/05/2023 1035   APPEARANCEUR CLOUDY (A) 07/05/2023 1035   LABSPEC 1.011 07/05/2023 1035   PHURINE 6.0 07/05/2023 1035   GLUCOSEU NEGATIVE 07/05/2023  1035   HGBUR NEGATIVE 07/05/2023 1035   BILIRUBINUR NEGATIVE 07/05/2023 1035   BILIRUBINUR neg 01/14/2021 1535   KETONESUR NEGATIVE 07/05/2023 1035   PROTEINUR 100 (A) 07/05/2023 1035   UROBILINOGEN 0.2 01/14/2021 1535   NITRITE NEGATIVE 07/05/2023 1035   LEUKOCYTESUR LARGE (A) 07/05/2023 1035   Sepsis Labs Recent Labs  Lab 07/21/23 0957  WBC 5.8   Microbiology No results found for this or any previous visit (from the past 240 hours).   Time coordinating discharge: 35 minutes  SIGNED:   Renato Applebaum, MD  [facility] 07/24/2023, 11:01 AM

## 2023-07-24 NOTE — TOC Transition Note (Addendum)
 Transition of Care [facility]) - Discharge Note   Patient Details  Name: Michelle [name] MRN: [id number] Date of Birth: 17-Mar-1933  Transition of Care [facility] CM/SW Contact:  Andrez JULIANNA George, RN Phone Number: 07/24/2023, 11:31 AM   Clinical Narrative:     Pt is discharging to [facility] today. Son, Alm is at the bedside and in agreement. Pt will transport via [facility].  CM has cancelled palliative care through [facility] and sent referral to [facility] for palliative care per son request.   Room: 1203P Number for report: [phone]  Final next level of care: Skilled Nursing Facility Barriers to Discharge: No Barriers Identified   Patient Goals and CMS Choice   CMS Medicare.gov Compare Post Acute Care list provided to:: Patient Represenative (must comment) Choice offered to / list presented to : Adult Children      Discharge Placement              Patient chooses bed at: [facility] Patient to be transferred to facility by: [facility] Name of family member notified: David--son Patient and family notified of of transfer: 07/24/23  Discharge Plan and Services Additional resources added to the After Visit Summary for     Discharge Planning Services: CM Consult Post Acute Care Choice: Home Health                               Social Drivers of Health (SDOH) Interventions SDOH Screenings   Food Insecurity: Patient Unable To Answer (07/05/2023)  Housing: Patient Unable To Answer (07/05/2023)  Transportation Needs: Patient Unable To Answer (07/05/2023)  Utilities: Patient Unable To Answer (07/05/2023)  Depression (PHQ2-9): Medium Risk (06/26/2023)  Financial Resource Strain: Low Risk  (06/26/2023)  Physical Activity: Insufficiently Active (06/26/2023)  Social Connections: Socially Isolated (07/17/2023)  Stress: No Stress Concern Present (06/26/2023)  Tobacco Use: Medium Risk (07/05/2023)  Health Literacy: Adequate Health Literacy (06/26/2023)     Readmission  Risk Interventions     No data to display

## 2023-07-24 NOTE — Progress Notes (Signed)
 Occupational Therapy Treatment Patient Details Name: Michelle [name] MRN: [id number] DOB: Aug 06, 1932 Today's Date: 07/24/2023   History of present illness 89 yo female presenting with AMS and seizures on 07/05/23. CT head showing Small volume subarachnoid hemorrhage in the right frontal lobe. Seizure meds and BP meds being adjusted.  PMH including dementia, hypertension, hyperlipidemia, diabetes, COPD, CKD 3, CAD status post stent, epilepsy, diastolic CHF, anxiety, anemia.   OT comments  Patient sitting EOB at end of bed upon arrival, stating I need to go the kitchen. Handed off from NT, with use of stedy, able to transfer to recliner. Patient requiring multiple attempts in order to sequence, and significantly elevated bed in order to come into standing with heavy mod A. Unable to progress with functional mobility in session. OT continues to recommend to lesser intensity rehab, OT will follow. Goals updated at end of session to reflect progress.      If plan is discharge home, recommend the following:  A lot of help with walking and/or transfers;A lot of help with bathing/dressing/bathroom;Assistance with cooking/housework;Direct supervision/assist for medications management;Direct supervision/assist for financial management;Assist for transportation;Help with stairs or ramp for entrance   Equipment Recommendations  None recommended by OT    Recommendations for Other Services      Precautions / Restrictions Precautions Precautions: Fall Restrictions Weight Bearing Restrictions Per Provider Order: No       Mobility Bed Mobility Overal bed mobility: Needs Assistance             General bed mobility comments: sitting EOB upon arrival    Transfers Overall transfer level: Needs assistance Equipment used: Ambulation equipment used (sara stedy) Transfers: Sit to/from Stand, Bed to chair/wheelchair/BSC Sit to Stand: Max assist, Mod assist           General transfer comment:  Significant cues to motor plan feet and hand placement on stedy. Elevated bed in order to come into standing, heavy mod to max A to complete standing Transfer via Lift Equipment: Stedy   Balance Overall balance assessment: Needs assistance Sitting-balance support: No upper extremity supported, Feet supported Sitting balance-Leahy Scale: Fair Sitting balance - Comments: assist for balance, unable to maintain without bed rails Postural control: Right lateral lean, Posterior lean Standing balance support: Bilateral upper extremity supported Standing balance-Leahy Scale: Poor Standing balance comment: unable to maintain standing for more than 30 seconds with use of stedy                           ADL either performed or assessed with clinical judgement   ADL Overall ADL's : Needs assistance/impaired Eating/Feeding: Set up;Sitting   Grooming: Set up;Sitting               Lower Body Dressing: Maximal assistance   Toilet Transfer: Moderate assistance;+2 for physical assistance Toilet Transfer Details (indicate cue type and reason): use of stedy in order complete Toileting- Clothing Manipulation and Hygiene: Maximal assistance Toileting - Clothing Manipulation Details (indicate cue type and reason): incontinenet of urine;wears briefs     Functional mobility during ADLs: Moderate assistance;+2 for physical assistance General ADL Comments: Patient sitting EOB at end of bed upon arrival, stating I need to go the kitchen. Handed off from NT, with use of stedy, able to transfer to recliner. Patient requiring multiple attempts in order to sequence, and significantly elevated bed in order to come into standing with heavy mod A. Unable to progress with functional mobility in session. OT  continues to recommend to lesser intensity rehab, OT will follow.    Extremity/Trunk Assessment              Vision       Perception Perception Perception: Impaired Preception Impairment  Details: Inattention/Neglect Perception-Other Comments: mild on R side   Praxis Praxis Praxis: Impaired Praxis Impairment Details: Motor planning;Organization;Initiation    Cognition Arousal: Lethargic Behavior During Therapy: Flat affect, Impulsive, Restless Overall Cognitive Status: Impaired/Different from baseline Area of Impairment: Memory, Following commands, Safety/judgement, Awareness, Problem solving, Orientation, Attention                 Orientation Level: Situation, Time Current Attention Level: Sustained Memory: Decreased short-term memory, Decreased recall of precautions Following Commands: Follows multi-step commands inconsistently, Follows one step commands with increased time Safety/Judgement: Decreased awareness of safety, Decreased awareness of deficits Awareness: Emergent Problem Solving: Slow processing, Requires tactile cues, Requires verbal cues, Decreased initiation General Comments: Patient sitting EOB upon arrival, unable to maintain balance sitting EOB without assist, requiring step by step multi-modal cues in order to sequence Stedy, required multiple attempts prior to completing        Exercises      Shoulder Instructions       General Comments      Pertinent Vitals/ Pain       Pain Assessment Pain Assessment: Faces Pain Score: 0-No pain Faces Pain Scale: Hurts little more  Home Living                                          Prior Functioning/Environment              Frequency  Min 1X/week        Progress Toward Goals  OT Goals(current goals can now be found in the care plan section)  Progress towards OT goals: Progressing toward goals  Acute Rehab OT Goals Patient Stated Goal: to go to the kitchen OT Goal Formulation: Patient unable to participate in goal setting Time For Goal Achievement: 08/07/23 Potential to Achieve Goals: Fair ADL Goals Pt Will Perform Grooming: with modified  independence;sitting Pt Will Perform Lower Body Bathing: with min assist;sit to/from stand Pt Will Perform Upper Body Dressing: with set-up;with supervision;sitting Pt Will Perform Lower Body Dressing: with min assist;sit to/from stand Pt Will Transfer to Toilet: with min assist;stand pivot transfer;bedside commode Pt Will Perform Toileting - Clothing Manipulation and hygiene: with contact guard assist;sit to/from stand Additional ADL Goal #1: Pt will perform bed mobility with Min A in preparation for ADLs Additional ADL Goal #2: Pt will follow 100% of 1 step commands to demonstrate improved cognition for ADLs  Plan      Co-evaluation                 AM-PAC OT 6 Clicks Daily Activity     Outcome Measure   Help from another person eating meals?: A Little Help from another person taking care of personal grooming?: A Little Help from another person toileting, which includes using toliet, bedpan, or urinal?: A Lot Help from another person bathing (including washing, rinsing, drying)?: A Lot Help from another person to put on and taking off regular upper body clothing?: A Lot Help from another person to put on and taking off regular lower body clothing?: A Lot 6 Click Score: 14    End of Session Equipment Utilized During Treatment:  Gait belt;Other (comment) Laurent)  OT Visit Diagnosis: Unsteadiness on feet (R26.81);Other abnormalities of gait and mobility (R26.89);Muscle weakness (generalized) (M62.81)   Activity Tolerance Patient limited by fatigue   Patient Left in chair;with call bell/phone within reach;with chair alarm set   Nurse Communication Mobility status        Time: 9171-9158 OT Time Calculation (min): 13 min  Charges: OT General Charges $OT Visit: 1 Visit OT Treatments $Self Care/Home Management : 8-22 mins  Ronal Gift E. Maryjayne Kleven, OTR/L Acute Rehabilitation Services [phone]   Ronal Gift Salt 07/24/2023, 10:33 AM

## 2023-07-24 NOTE — Progress Notes (Signed)
   Palliative Medicine Inpatient Follow Up Note HPI: Michelle [name] is 89 y.o. female with dementia, wheelchair-bound at baseline, hypertension, hyperlipidemia, diabetes, seizure disorder, COPD, CKD stage III, CAD status post stenting, epilepsy, diastolic CHF, anxiety, anemia, who presented with altered mental status and seizure, found by family.    Palliative care has been asked to get involved for further goals of care conversations.    Today's Discussion 07/24/2023  *Please note that this is a verbal dictation therefore any spelling or grammatical errors are due to the [employer] One system interpretation.  Chart reviewed inclusive of vital signs, progress notes, laboratory results, and diagnostic images.  I spoke with patients night RN, Delene. He shares that Michelle [name] has been disoriented all throughout the night.   I met with Michelle [name] at bedside this morning. She is awake and disoriented. She shares with me that her son is standing outside and she is pointing towards the hallway. Reviewed that patient is in the hospital. Provided encouraging reassurance and redirection.   Plan for transition to [facility] today.  Questions and concerns addressed/Palliative Support Provided.   Objective Assessment: Vital Signs Vitals:   07/24/23 0342 07/24/23 0859  BP: (!) 142/75 (!) 128/58  Pulse: 66 78  Resp: 16 16  Temp: 98.6 F (37 C) 99.3 F (37.4 C)  SpO2: 100% 93%    Intake/Output Summary (Last 24 hours) at 07/24/2023 1035 Last data filed at 07/24/2023 9657 Gross per 24 hour  Intake --  Output 950 ml  Net -950 ml    Last Weight  Most recent update: 07/05/2023  9:05 AM    Weight  75.8 kg (167 lb 1.7 oz)            Gen:  Elderly Caucasian F in NAD HEENT: moist mucous membranes CV: Regular rate and rhythm  PULM:  On RA, breathing is even and nonlabored ABD: soft/nontender  EXT: No edema  Neuro: Alert and oriented to self  SUMMARY OF RECOMMENDATIONS   DNAR/DNI    Plan for transition to [facility] place  with OP Palliative support   Delirium precautions and frequent redirection  Ongoing Palliative support as needed  MDM: Low ______________________________________________________________________________________ [name] [place] Palliative Medicine Team Team Cell Phone: [phone] Please utilize secure chat with additional questions, if there is no response within 30 minutes please call the above phone number  Palliative Medicine Team providers are available by phone from 7am to 7pm daily and can be reached through the team cell phone.  Should this patient require assistance outside of these hours, please call the patient's attending physician.

## 2023-07-24 NOTE — Plan of Care (Signed)
   Problem: Education: Goal: Ability to describe self-care measures that may prevent or decrease complications (Diabetes Survival Skills Education) will improve Outcome: Progressing   Problem: Coping: Goal: Ability to adjust to condition or change in health will improve Outcome: Progressing   Problem: Fluid Volume: Goal: Ability to maintain a balanced intake and output will improve Outcome: Progressing

## 2023-07-25 DIAGNOSIS — R569 Unspecified convulsions: Secondary | ICD-10-CM | POA: Diagnosis not present

## 2023-07-25 DIAGNOSIS — E119 Type 2 diabetes mellitus without complications: Secondary | ICD-10-CM | POA: Diagnosis not present

## 2023-07-25 DIAGNOSIS — J449 Chronic obstructive pulmonary disease, unspecified: Secondary | ICD-10-CM | POA: Diagnosis not present

## 2023-07-25 DIAGNOSIS — I5032 Chronic diastolic (congestive) heart failure: Secondary | ICD-10-CM | POA: Diagnosis not present

## 2023-07-25 DIAGNOSIS — N390 Urinary tract infection, site not specified: Secondary | ICD-10-CM | POA: Diagnosis not present

## 2023-07-25 DIAGNOSIS — M6281 Muscle weakness (generalized): Secondary | ICD-10-CM | POA: Diagnosis not present

## 2023-07-25 DIAGNOSIS — G40909 Epilepsy, unspecified, not intractable, without status epilepticus: Secondary | ICD-10-CM | POA: Diagnosis not present

## 2023-07-25 DIAGNOSIS — F03B3 Unspecified dementia, moderate, with mood disturbance: Secondary | ICD-10-CM | POA: Diagnosis not present

## 2023-07-25 DIAGNOSIS — I10 Essential (primary) hypertension: Secondary | ICD-10-CM | POA: Diagnosis not present

## 2023-07-25 DIAGNOSIS — F331 Major depressive disorder, recurrent, moderate: Secondary | ICD-10-CM | POA: Diagnosis not present

## 2023-07-25 DIAGNOSIS — R278 Other lack of coordination: Secondary | ICD-10-CM | POA: Diagnosis not present

## 2023-07-26 DIAGNOSIS — G40909 Epilepsy, unspecified, not intractable, without status epilepticus: Secondary | ICD-10-CM | POA: Diagnosis not present

## 2023-07-26 DIAGNOSIS — I5032 Chronic diastolic (congestive) heart failure: Secondary | ICD-10-CM | POA: Diagnosis not present

## 2023-07-26 DIAGNOSIS — E119 Type 2 diabetes mellitus without complications: Secondary | ICD-10-CM | POA: Diagnosis not present

## 2023-07-26 DIAGNOSIS — N390 Urinary tract infection, site not specified: Secondary | ICD-10-CM | POA: Diagnosis not present

## 2023-07-26 DIAGNOSIS — R296 Repeated falls: Secondary | ICD-10-CM | POA: Diagnosis not present

## 2023-07-26 DIAGNOSIS — I10 Essential (primary) hypertension: Secondary | ICD-10-CM | POA: Diagnosis not present

## 2023-07-26 DIAGNOSIS — F331 Major depressive disorder, recurrent, moderate: Secondary | ICD-10-CM | POA: Diagnosis not present

## 2023-07-26 DIAGNOSIS — J449 Chronic obstructive pulmonary disease, unspecified: Secondary | ICD-10-CM | POA: Diagnosis not present

## 2023-07-27 ENCOUNTER — Other Ambulatory Visit: Payer: Self-pay | Admitting: Family Medicine

## 2023-07-27 DIAGNOSIS — I10 Essential (primary) hypertension: Secondary | ICD-10-CM | POA: Diagnosis not present

## 2023-07-27 DIAGNOSIS — F331 Major depressive disorder, recurrent, moderate: Secondary | ICD-10-CM | POA: Diagnosis not present

## 2023-07-27 DIAGNOSIS — G40909 Epilepsy, unspecified, not intractable, without status epilepticus: Secondary | ICD-10-CM | POA: Diagnosis not present

## 2023-07-27 DIAGNOSIS — N390 Urinary tract infection, site not specified: Secondary | ICD-10-CM | POA: Diagnosis not present

## 2023-07-27 DIAGNOSIS — I5032 Chronic diastolic (congestive) heart failure: Secondary | ICD-10-CM | POA: Diagnosis not present

## 2023-07-27 DIAGNOSIS — J449 Chronic obstructive pulmonary disease, unspecified: Secondary | ICD-10-CM | POA: Diagnosis not present

## 2023-07-27 DIAGNOSIS — E119 Type 2 diabetes mellitus without complications: Secondary | ICD-10-CM | POA: Diagnosis not present

## 2023-07-27 NOTE — Telephone Encounter (Signed)
 Last OV 07/04/23 Next OV not scheduled  Last refill - historical provider

## 2023-07-28 DIAGNOSIS — R569 Unspecified convulsions: Secondary | ICD-10-CM | POA: Diagnosis not present

## 2023-07-28 DIAGNOSIS — R278 Other lack of coordination: Secondary | ICD-10-CM | POA: Diagnosis not present

## 2023-07-28 DIAGNOSIS — F331 Major depressive disorder, recurrent, moderate: Secondary | ICD-10-CM | POA: Diagnosis not present

## 2023-07-28 DIAGNOSIS — I5032 Chronic diastolic (congestive) heart failure: Secondary | ICD-10-CM | POA: Diagnosis not present

## 2023-07-28 DIAGNOSIS — E119 Type 2 diabetes mellitus without complications: Secondary | ICD-10-CM | POA: Diagnosis not present

## 2023-07-28 DIAGNOSIS — I10 Essential (primary) hypertension: Secondary | ICD-10-CM | POA: Diagnosis not present

## 2023-07-28 DIAGNOSIS — J449 Chronic obstructive pulmonary disease, unspecified: Secondary | ICD-10-CM | POA: Diagnosis not present

## 2023-07-28 DIAGNOSIS — F03B3 Unspecified dementia, moderate, with mood disturbance: Secondary | ICD-10-CM | POA: Diagnosis not present

## 2023-07-28 DIAGNOSIS — M6281 Muscle weakness (generalized): Secondary | ICD-10-CM | POA: Diagnosis not present

## 2023-07-28 DIAGNOSIS — N390 Urinary tract infection, site not specified: Secondary | ICD-10-CM | POA: Diagnosis not present

## 2023-07-28 DIAGNOSIS — G40909 Epilepsy, unspecified, not intractable, without status epilepticus: Secondary | ICD-10-CM | POA: Diagnosis not present

## 2023-07-29 ENCOUNTER — Other Ambulatory Visit: Payer: Self-pay | Admitting: Family Medicine

## 2023-07-31 DIAGNOSIS — E119 Type 2 diabetes mellitus without complications: Secondary | ICD-10-CM | POA: Diagnosis not present

## 2023-07-31 DIAGNOSIS — F331 Major depressive disorder, recurrent, moderate: Secondary | ICD-10-CM | POA: Diagnosis not present

## 2023-07-31 DIAGNOSIS — I10 Essential (primary) hypertension: Secondary | ICD-10-CM | POA: Diagnosis not present

## 2023-07-31 DIAGNOSIS — R296 Repeated falls: Secondary | ICD-10-CM | POA: Diagnosis not present

## 2023-07-31 DIAGNOSIS — I5032 Chronic diastolic (congestive) heart failure: Secondary | ICD-10-CM | POA: Diagnosis not present

## 2023-07-31 DIAGNOSIS — J449 Chronic obstructive pulmonary disease, unspecified: Secondary | ICD-10-CM | POA: Diagnosis not present

## 2023-07-31 DIAGNOSIS — N390 Urinary tract infection, site not specified: Secondary | ICD-10-CM | POA: Diagnosis not present

## 2023-07-31 DIAGNOSIS — G40909 Epilepsy, unspecified, not intractable, without status epilepticus: Secondary | ICD-10-CM | POA: Diagnosis not present

## 2023-08-01 DIAGNOSIS — E119 Type 2 diabetes mellitus without complications: Secondary | ICD-10-CM | POA: Diagnosis not present

## 2023-08-01 DIAGNOSIS — G40909 Epilepsy, unspecified, not intractable, without status epilepticus: Secondary | ICD-10-CM | POA: Diagnosis not present

## 2023-08-01 DIAGNOSIS — R278 Other lack of coordination: Secondary | ICD-10-CM | POA: Diagnosis not present

## 2023-08-01 DIAGNOSIS — I5032 Chronic diastolic (congestive) heart failure: Secondary | ICD-10-CM | POA: Diagnosis not present

## 2023-08-01 DIAGNOSIS — N390 Urinary tract infection, site not specified: Secondary | ICD-10-CM | POA: Diagnosis not present

## 2023-08-01 DIAGNOSIS — F03B3 Unspecified dementia, moderate, with mood disturbance: Secondary | ICD-10-CM | POA: Diagnosis not present

## 2023-08-01 DIAGNOSIS — I10 Essential (primary) hypertension: Secondary | ICD-10-CM | POA: Diagnosis not present

## 2023-08-01 DIAGNOSIS — J449 Chronic obstructive pulmonary disease, unspecified: Secondary | ICD-10-CM | POA: Diagnosis not present

## 2023-08-01 DIAGNOSIS — F331 Major depressive disorder, recurrent, moderate: Secondary | ICD-10-CM | POA: Diagnosis not present

## 2023-08-01 DIAGNOSIS — M6281 Muscle weakness (generalized): Secondary | ICD-10-CM | POA: Diagnosis not present

## 2023-08-01 DIAGNOSIS — R569 Unspecified convulsions: Secondary | ICD-10-CM | POA: Diagnosis not present

## 2023-08-03 DIAGNOSIS — F331 Major depressive disorder, recurrent, moderate: Secondary | ICD-10-CM | POA: Diagnosis not present

## 2023-08-03 DIAGNOSIS — I10 Essential (primary) hypertension: Secondary | ICD-10-CM | POA: Diagnosis not present

## 2023-08-03 DIAGNOSIS — J449 Chronic obstructive pulmonary disease, unspecified: Secondary | ICD-10-CM | POA: Diagnosis not present

## 2023-08-03 DIAGNOSIS — N390 Urinary tract infection, site not specified: Secondary | ICD-10-CM | POA: Diagnosis not present

## 2023-08-03 DIAGNOSIS — G40909 Epilepsy, unspecified, not intractable, without status epilepticus: Secondary | ICD-10-CM | POA: Diagnosis not present

## 2023-08-03 DIAGNOSIS — I5032 Chronic diastolic (congestive) heart failure: Secondary | ICD-10-CM | POA: Diagnosis not present

## 2023-08-03 DIAGNOSIS — E119 Type 2 diabetes mellitus without complications: Secondary | ICD-10-CM | POA: Diagnosis not present

## 2023-08-04 DIAGNOSIS — R569 Unspecified convulsions: Secondary | ICD-10-CM | POA: Diagnosis not present

## 2023-08-04 DIAGNOSIS — F331 Major depressive disorder, recurrent, moderate: Secondary | ICD-10-CM | POA: Diagnosis not present

## 2023-08-04 DIAGNOSIS — M6281 Muscle weakness (generalized): Secondary | ICD-10-CM | POA: Diagnosis not present

## 2023-08-04 DIAGNOSIS — F03B3 Unspecified dementia, moderate, with mood disturbance: Secondary | ICD-10-CM | POA: Diagnosis not present

## 2023-08-04 DIAGNOSIS — J449 Chronic obstructive pulmonary disease, unspecified: Secondary | ICD-10-CM | POA: Diagnosis not present

## 2023-08-04 DIAGNOSIS — R278 Other lack of coordination: Secondary | ICD-10-CM | POA: Diagnosis not present

## 2023-08-08 DIAGNOSIS — M6281 Muscle weakness (generalized): Secondary | ICD-10-CM | POA: Diagnosis not present

## 2023-08-08 DIAGNOSIS — R278 Other lack of coordination: Secondary | ICD-10-CM | POA: Diagnosis not present

## 2023-08-08 DIAGNOSIS — J449 Chronic obstructive pulmonary disease, unspecified: Secondary | ICD-10-CM | POA: Diagnosis not present

## 2023-08-08 DIAGNOSIS — F03B3 Unspecified dementia, moderate, with mood disturbance: Secondary | ICD-10-CM | POA: Diagnosis not present

## 2023-08-08 DIAGNOSIS — F331 Major depressive disorder, recurrent, moderate: Secondary | ICD-10-CM | POA: Diagnosis not present

## 2023-08-08 DIAGNOSIS — R569 Unspecified convulsions: Secondary | ICD-10-CM | POA: Diagnosis not present

## 2023-08-11 DIAGNOSIS — M6281 Muscle weakness (generalized): Secondary | ICD-10-CM | POA: Diagnosis not present

## 2023-08-11 DIAGNOSIS — J449 Chronic obstructive pulmonary disease, unspecified: Secondary | ICD-10-CM | POA: Diagnosis not present

## 2023-08-11 DIAGNOSIS — F331 Major depressive disorder, recurrent, moderate: Secondary | ICD-10-CM | POA: Diagnosis not present

## 2023-08-11 DIAGNOSIS — R569 Unspecified convulsions: Secondary | ICD-10-CM | POA: Diagnosis not present

## 2023-08-11 DIAGNOSIS — F03B3 Unspecified dementia, moderate, with mood disturbance: Secondary | ICD-10-CM | POA: Diagnosis not present

## 2023-08-11 DIAGNOSIS — R278 Other lack of coordination: Secondary | ICD-10-CM | POA: Diagnosis not present

## 2023-08-15 DIAGNOSIS — M6281 Muscle weakness (generalized): Secondary | ICD-10-CM | POA: Diagnosis not present

## 2023-08-15 DIAGNOSIS — R278 Other lack of coordination: Secondary | ICD-10-CM | POA: Diagnosis not present

## 2023-08-15 DIAGNOSIS — J449 Chronic obstructive pulmonary disease, unspecified: Secondary | ICD-10-CM | POA: Diagnosis not present

## 2023-08-15 DIAGNOSIS — F03B3 Unspecified dementia, moderate, with mood disturbance: Secondary | ICD-10-CM | POA: Diagnosis not present

## 2023-08-15 DIAGNOSIS — R569 Unspecified convulsions: Secondary | ICD-10-CM | POA: Diagnosis not present

## 2023-08-15 DIAGNOSIS — F331 Major depressive disorder, recurrent, moderate: Secondary | ICD-10-CM | POA: Diagnosis not present

## 2023-08-17 ENCOUNTER — Other Ambulatory Visit: Payer: Self-pay | Admitting: Family Medicine

## 2023-08-18 DIAGNOSIS — N390 Urinary tract infection, site not specified: Secondary | ICD-10-CM | POA: Diagnosis not present

## 2023-08-18 DIAGNOSIS — E1165 Type 2 diabetes mellitus with hyperglycemia: Secondary | ICD-10-CM | POA: Diagnosis not present

## 2023-08-18 DIAGNOSIS — I10 Essential (primary) hypertension: Secondary | ICD-10-CM | POA: Diagnosis not present

## 2023-08-18 DIAGNOSIS — F331 Major depressive disorder, recurrent, moderate: Secondary | ICD-10-CM | POA: Diagnosis not present

## 2023-08-18 DIAGNOSIS — I5032 Chronic diastolic (congestive) heart failure: Secondary | ICD-10-CM | POA: Diagnosis not present

## 2023-08-18 DIAGNOSIS — E119 Type 2 diabetes mellitus without complications: Secondary | ICD-10-CM | POA: Diagnosis not present

## 2023-08-18 DIAGNOSIS — G40909 Epilepsy, unspecified, not intractable, without status epilepticus: Secondary | ICD-10-CM | POA: Diagnosis not present

## 2023-08-18 DIAGNOSIS — J449 Chronic obstructive pulmonary disease, unspecified: Secondary | ICD-10-CM | POA: Diagnosis not present

## 2023-08-20 ENCOUNTER — Emergency Department (HOSPITAL_COMMUNITY): Payer: Medicare HMO

## 2023-08-20 ENCOUNTER — Emergency Department (HOSPITAL_COMMUNITY)
Admission: EM | Admit: 2023-08-20 | Discharge: 2023-08-20 | Disposition: A | Discharge status: Home / Self Care | Payer: Medicare HMO | Attending: Emergency Medicine | Admitting: Emergency Medicine

## 2023-08-20 ENCOUNTER — Other Ambulatory Visit: Payer: Self-pay

## 2023-08-20 DIAGNOSIS — N183 Chronic kidney disease, stage 3 unspecified: Secondary | ICD-10-CM | POA: Diagnosis not present

## 2023-08-20 DIAGNOSIS — Z79899 Other long term (current) drug therapy: Secondary | ICD-10-CM | POA: Insufficient documentation

## 2023-08-20 DIAGNOSIS — Z7401 Bed confinement status: Secondary | ICD-10-CM | POA: Diagnosis not present

## 2023-08-20 DIAGNOSIS — G40919 Epilepsy, unspecified, intractable, without status epilepticus: Secondary | ICD-10-CM | POA: Insufficient documentation

## 2023-08-20 DIAGNOSIS — I700 Atherosclerosis of aorta: Secondary | ICD-10-CM | POA: Diagnosis not present

## 2023-08-20 DIAGNOSIS — E1122 Type 2 diabetes mellitus with diabetic chronic kidney disease: Secondary | ICD-10-CM | POA: Diagnosis not present

## 2023-08-20 DIAGNOSIS — Z7982 Long term (current) use of aspirin: Secondary | ICD-10-CM | POA: Insufficient documentation

## 2023-08-20 DIAGNOSIS — I5032 Chronic diastolic (congestive) heart failure: Secondary | ICD-10-CM | POA: Insufficient documentation

## 2023-08-20 DIAGNOSIS — R410 Disorientation, unspecified: Secondary | ICD-10-CM | POA: Diagnosis not present

## 2023-08-20 DIAGNOSIS — J449 Chronic obstructive pulmonary disease, unspecified: Secondary | ICD-10-CM | POA: Insufficient documentation

## 2023-08-20 DIAGNOSIS — I10 Essential (primary) hypertension: Secondary | ICD-10-CM | POA: Diagnosis not present

## 2023-08-20 DIAGNOSIS — G40909 Epilepsy, unspecified, not intractable, without status epilepticus: Secondary | ICD-10-CM | POA: Diagnosis not present

## 2023-08-20 DIAGNOSIS — I130 Hypertensive heart and chronic kidney disease with heart failure and stage 1 through stage 4 chronic kidney disease, or unspecified chronic kidney disease: Secondary | ICD-10-CM | POA: Insufficient documentation

## 2023-08-20 DIAGNOSIS — Z1152 Encounter for screening for COVID-19: Secondary | ICD-10-CM | POA: Diagnosis not present

## 2023-08-20 DIAGNOSIS — R569 Unspecified convulsions: Secondary | ICD-10-CM | POA: Diagnosis not present

## 2023-08-20 DIAGNOSIS — I2510 Atherosclerotic heart disease of native coronary artery without angina pectoris: Secondary | ICD-10-CM | POA: Diagnosis not present

## 2023-08-20 LAB — CBC WITH DIFFERENTIAL/PLATELET
Abs Immature Granulocytes: 0.02 10*3/uL (ref 0.00–0.07)
Basophils Absolute: 0.1 10*3/uL (ref 0.0–0.1)
Basophils Relative: 1 %
Eosinophils Absolute: 0.1 10*3/uL (ref 0.0–0.5)
Eosinophils Relative: 1 %
HCT: 43.5 % (ref 36.0–46.0)
Hemoglobin: 13.3 g/dL (ref 12.0–15.0)
Immature Granulocytes: 0 %
Lymphocytes Relative: 29 %
Lymphs Abs: 1.7 10*3/uL (ref 0.7–4.0)
MCH: 27.7 pg (ref 26.0–34.0)
MCHC: 30.6 g/dL (ref 30.0–36.0)
MCV: 90.4 fL (ref 80.0–100.0)
Monocytes Absolute: 0.6 10*3/uL (ref 0.1–1.0)
Monocytes Relative: 10 %
Neutro Abs: 3.6 10*3/uL (ref 1.7–7.7)
Neutrophils Relative %: 59 %
Platelets: 216 10*3/uL (ref 150–400)
RBC: 4.81 MIL/uL (ref 3.87–5.11)
RDW: 14.6 % (ref 11.5–15.5)
WBC: 6 10*3/uL (ref 4.0–10.5)
nRBC: 0 % (ref 0.0–0.2)

## 2023-08-20 LAB — I-STAT CHEM 8, ED
BUN: 20 mg/dL (ref 8–23)
Calcium, Ion: 1.11 mmol/L — ABNORMAL LOW (ref 1.15–1.40)
Chloride: 106 mmol/L (ref 98–111)
Creatinine, Ser: 0.7 mg/dL (ref 0.44–1.00)
Glucose, Bld: 136 mg/dL — ABNORMAL HIGH (ref 70–99)
HCT: 39 % (ref 36.0–46.0)
Hemoglobin: 13.3 g/dL (ref 12.0–15.0)
Potassium: 3.9 mmol/L (ref 3.5–5.1)
Sodium: 137 mmol/L (ref 135–145)
TCO2: 21 mmol/L — ABNORMAL LOW (ref 22–32)

## 2023-08-20 LAB — COMPREHENSIVE METABOLIC PANEL
ALT: 15 U/L (ref 0–44)
AST: 23 U/L (ref 15–41)
Albumin: 3.1 g/dL — ABNORMAL LOW (ref 3.5–5.0)
Alkaline Phosphatase: 41 U/L (ref 38–126)
Anion gap: 10 (ref 5–15)
BUN: 20 mg/dL (ref 8–23)
CO2: 21 mmol/L — ABNORMAL LOW (ref 22–32)
Calcium: 9.3 mg/dL (ref 8.9–10.3)
Chloride: 104 mmol/L (ref 98–111)
Creatinine, Ser: 0.68 mg/dL (ref 0.44–1.00)
GFR, Estimated: >60 mL/min (ref >60)
Glucose, Bld: 143 mg/dL — ABNORMAL HIGH (ref 70–99)
Potassium: 4 mmol/L (ref 3.5–5.1)
Sodium: 135 mmol/L (ref 135–145)
Total Bilirubin: 0.5 mg/dL (ref 0.0–1.2)
Total Protein: 6.7 g/dL (ref 6.5–8.1)

## 2023-08-20 LAB — RESP PANEL BY RT-PCR (RSV, FLU A&B, COVID)  RVPGX2
Influenza A by PCR: NEGATIVE
Influenza B by PCR: NEGATIVE
Resp Syncytial Virus by PCR: NEGATIVE
SARS Coronavirus 2 by RT PCR: NEGATIVE

## 2023-08-20 LAB — URINALYSIS, ROUTINE W REFLEX MICROSCOPIC
Bilirubin Urine: NEGATIVE
Glucose, UA: NEGATIVE mg/dL
Hgb urine dipstick: NEGATIVE
Ketones, ur: NEGATIVE mg/dL
Leukocytes,Ua: NEGATIVE
Nitrite: NEGATIVE
Protein, ur: NEGATIVE mg/dL
Specific Gravity, Urine: 1.01 (ref 1.005–1.030)
pH: 6 (ref 5.0–8.0)

## 2023-08-20 LAB — CK: Total CK: 26 U/L — ABNORMAL LOW (ref 38–234)

## 2023-08-20 LAB — BRAIN NATRIURETIC PEPTIDE: B Natriuretic Peptide: 182.1 pg/mL — ABNORMAL HIGH (ref 0.0–100.0)

## 2023-08-20 MED ORDER — ASPIRIN 81 MG PO CHEW: 81.0000 mg | CHEWABLE_TABLET | Freq: Every day | ORAL | Status: DC

## 2023-08-20 MED ORDER — PANTOPRAZOLE SODIUM 20 MG PO TBEC: 20.0000 mg | DELAYED_RELEASE_TABLET | Freq: Every day | ORAL | Status: DC

## 2023-08-20 MED ORDER — LAMOTRIGINE 25 MG PO TABS: 75.0000 mg | ORAL_TABLET | Freq: Two times a day (BID) | ORAL | 0 refills | Status: DC

## 2023-08-20 MED ORDER — SODIUM CHLORIDE 0.9 % IV BOLUS
500.0000 mL | Freq: Once | INTRAVENOUS | Status: AC
  Administered 2023-08-20: 500 mL via INTRAVENOUS

## 2023-08-20 MED ORDER — PANTOPRAZOLE SODIUM 40 MG PO TBEC: 40.0000 mg | DELAYED_RELEASE_TABLET | Freq: Every day | ORAL | Status: DC

## 2023-08-20 MED ORDER — ROSUVASTATIN CALCIUM 20 MG PO TABS: 40.0000 mg | ORAL_TABLET | Freq: Every day | ORAL | Status: DC

## 2023-08-20 MED ORDER — LAMOTRIGINE 25 MG PO TABS: 75.0000 mg | ORAL_TABLET | Freq: Two times a day (BID) | ORAL | Status: DC

## 2023-08-20 MED ORDER — LAMOTRIGINE 25 MG PO TABS
75.0000 mg | ORAL_TABLET | Freq: Once | ORAL | Status: AC
  Administered 2023-08-20: 75 mg via ORAL
  Filled 2023-08-20: qty 3

## 2023-08-20 MED ORDER — MOMETASONE FURO-FORMOTEROL FUM 200-5 MCG/ACT IN AERO
2.0000 | INHALATION_SPRAY | Freq: Two times a day (BID) | RESPIRATORY_TRACT | Status: DC
  Filled 2023-08-20: qty 8.8

## 2023-08-20 MED ORDER — QUETIAPINE FUMARATE 25 MG PO TABS: 12.5000 mg | ORAL_TABLET | Freq: Every evening | ORAL | Status: DC

## 2023-08-20 MED ORDER — CARVEDILOL 12.5 MG PO TABS: 12.5000 mg | ORAL_TABLET | Freq: Two times a day (BID) | ORAL | Status: DC

## 2023-08-20 MED ORDER — ENALAPRIL MALEATE 10 MG PO TABS: 20.0000 mg | ORAL_TABLET | Freq: Every day | ORAL | Status: DC

## 2023-08-20 MED ORDER — AMLODIPINE BESYLATE 5 MG PO TABS: 5.0000 mg | ORAL_TABLET | Freq: Every day | ORAL | Status: DC

## 2023-08-20 MED ORDER — AMLODIPINE BESYLATE 5 MG PO TABS: 2.5000 mg | ORAL_TABLET | Freq: Two times a day (BID) | ORAL | Status: DC

## 2023-08-20 MED ORDER — SERTRALINE HCL 100 MG PO TABS: 100.0000 mg | ORAL_TABLET | Freq: Every day | ORAL | Status: DC

## 2023-08-20 NOTE — ED Provider Notes (Signed)
[place] EMERGENCY DEPARTMENT AT [facility] Provider Note  CSN: [id number] Arrival date & time: 08/20/23 0030  Chief Complaint(s) Seizures  HPI Michelle ASCHENBRENNER is a 89 y.o. female with past medical history as below, significant for CAD, COPD, HLD, HTN, seizure disorder on Lamictal, dementia, hospice who presents to the ED with complaint of possible seizure  She was admitted in December with concern for breakthrough seizure.  She also was found to have a subarachnoid hemorrhage at that time.  Also UTI.  She was discharged 1/6, Lamictal was continued but Keppra was discontinued.  Recently released from SNF in the last few days.  She presents today with possible breakthrough seizure.  Patient has dementia, altered unable to provide history.  Son reports seizure activity prior to arrival, he gave her intranasal diazepam x 2.  Seizure activity seem to subside at that point.  Seizure activity felt to be similar to her prior seizure in the past.   Past Medical History Past Medical History:  Diagnosis Date   Anginal pain (HCC)    Arthritis    AV block, 1st degree    CAD (coronary artery disease) 1990; 2015   Cardiac cath 1990 with Dr. Riley Kill and [name] Residual CAD, non obstructive per 2015 cath. STEMI status post stent in August 2015.   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   Promus DES to RCA with STEMI   Shortness of breath    Patient Active Problem List   Diagnosis Date Noted   Seizures (HCC) 07/17/2023    Subarachnoid hemorrhage (HCC) 07/05/2023   Seizure (HCC) 07/05/2023   Overweight 06/16/2023   Hyperkalemia 06/14/2023   Breakthrough seizure (HCC) 05/17/2023   Wheelchair dependence 05/17/2023   DNR (do not resuscitate)/DNI(Do Not Intubate) 05/17/2023   Memory impairment 09/09/2022   Chronic pain of both knees 04/05/2022   Epilepsy (HCC) 03/12/2021   Adnexal mass 11/24/2020   Thickened endometrium 11/24/2020   Dementia (HCC) 10/22/2020   Hypokalemia 08/19/2020   Actinic keratosis 11/17/2017   Anemia, iron deficiency 10/23/2015   CKD (chronic kidney disease), stage III (HCC) 07/25/2014   Osteoarthritis, knee 05/13/2014   Anxiety state 04/21/2014   Chronic diastolic CHF (congestive heart failure) (HCC) 03/13/2014   SOB (shortness of breath) 03/05/2014   CAD- RCA PCI '90s, STEMI-RCA DES 02/18/14 03/05/2014   Back pain, lumbosacral 10/10/2012   Type 2 diabetes mellitus without complication, with long-term current use of insulin (HCC) 03/26/2007   Hyperlipidemia associated with type 2 diabetes mellitus (HCC) 03/26/2007   Essential hypertension 04/21/2006   COPD (chronic obstructive pulmonary disease) (HCC) 04/21/2006   Home Medication(s) Prior to Admission medications   Medication Sig Start Date End Date Taking? Authorizing Provider  acetaminophen (TYLENOL) 650 MG CR tablet Take 1,300 mg by mouth 3 (three) times daily.    [provider]  albuterol (PROVENTIL) (2.5 MG/3ML) 0.083% nebulizer solution Take 2.5 mg by nebulization every 6 (six) hours as needed for wheezing or shortness of breath.    [provider]  Alcohol Swabs (DROPSAFE ALCOHOL PREP) 70 % PADS USE THREE TIMES DAILY 08/17/23   Shelva Majestic, MD  amLODipine (NORVASC) 5 MG tablet Take 1 tablet (5 mg total) by mouth daily. 07/24/23   Dorcas Carrow, MD  aspirin 81 MG chewable tablet Chew 81 mg by mouth daily.    [provider]  carvedilol (COREG) 12.5 MG tablet Take 1 tablet (12.5 mg total) by  mouth 2 (two) times daily. 12/06/22   Alver Sorrow, NP  cholecalciferol (VITAMIN D3) 25 MCG (1000 UNIT) tablet Take 1,000 Units by mouth daily with breakfast.    [provider]  enalapril (VASOTEC) 20 MG tablet Take 1 tablet (20 mg total) by mouth at bedtime. 03/23/23   Alver Sorrow, NP  fenofibrate micronized (LOFIBRA) 134 MG capsule TAKE 1 CAPSULE EVERY DAY 12/27/22   Shelva Majestic, MD  ferrous sulfate 325 (65 FE) MG tablet Twice a week Patient taking differently: Take 325 mg by mouth 2 (two) times a week. Tuesday  and Friday 02/21/20   Shelva Majestic, MD  fluticasone-salmeterol (ADVAIR) 250-50 MCG/ACT AEPB INHALE 1 PUFF TWICE DAILY 07/20/23   Dulce Sellar, NP  furosemide (LASIX) 20 MG tablet Take 1 tablet (20 mg total) by mouth as needed for fluid or edema. 03/23/23   Alver Sorrow, NP  icosapent Ethyl (VASCEPA) 1 g capsule Take 2 capsules (2 g total) by mouth 2 (two) times daily. Patient taking differently: Take 1-2 g by mouth See admin instructions. Take 1 gram in the morning and afternoon. Take 2 g at bedtime 12/06/22   Alver Sorrow, NP  lamoTRIgine (LAMICTAL) 25 MG tablet Take 3 tablets (75 mg total) by mouth 2 (two) times daily. 08/20/23 09/19/23  Sloan Leiter, DO  loperamide (IMODIUM A-D) 2 MG tablet Take 2-4 mg by mouth 2 (two) times daily as needed for diarrhea or loose stools.    [provider]  loratadine (CLARITIN) 10 MG tablet Take 10 mg by mouth every other day.    [provider]  medroxyPROGESTERone (PROVERA) 10 MG tablet Take 1 tablet (10 mg total) by mouth daily. Patient taking differently: Take 10 mg by mouth at bedtime. 09/12/22   Warner Mccreedy D, NP  nortriptyline (PAMELOR) 10 MG capsule TAKE 1 CAPSULE BY MOUTH AT BEDTIME AS NEEDED FOR PAIN PREVENTION 07/27/23   Rodolph Bong, MD  pantoprazole (PROTONIX) 40 MG tablet TAKE 1 TABLET EVERY DAY 05/02/23   Shelva Majestic, MD  polyethylene glycol (MIRALAX / GLYCOLAX) 17 g packet Take  17 g by mouth daily. 07/24/23   Dorcas Carrow, MD  QUEtiapine (SEROQUEL) 25 MG tablet Take 0.5 tablets (12.5 mg total) by mouth every evening. Between 1830 and 1900 07/24/23   Dorcas Carrow, MD  rosuvastatin (CRESTOR) 40 MG tablet Take 1 tablet (40 mg total) by mouth daily. 05/17/23   Shelva Majestic, MD  sertraline (ZOLOFT) 50 MG tablet TAKE 2 TABLETS AT BEDTIME 07/31/23   Shelva Majestic, MD  SPIRIVA HANDIHALER 18 MCG inhalation capsule PLACE 1 CAPSULE INTO HANDIHALER AND INHALE THE CONTENTS DAILY 11/23/22   Shelva Majestic, MD  TRUEplus Lancets 33G MISC TEST BLOOD SUGAR UP TO FOUR TIMES DAILY AS DIRECTED 04/17/23   Shelva Majestic, MD  Past Surgical History Past Surgical History:  Procedure Laterality Date   CATARACT EXTRACTION  2020   right and left eye    CHOLECYSTECTOMY     thinks her appendix was removed at the same time   CORONARY ANGIOPLASTY WITH STENT PLACEMENT  02/18/14   Promus DES to RCA   LEFT HEART CATHETERIZATION WITH CORONARY ANGIOGRAM N/A 02/18/2014   Procedure: LEFT HEART CATHETERIZATION WITH CORONARY ANGIOGRAM;  Surgeon: Corky Crafts, MD;  Location: [facility] CATH LAB;  Service: Cardiovascular;  Laterality: N/A;   PTCA  1990   PTCA of RCA   TRANSTHORACIC ECHOCARDIOGRAM  02/02/2012   mild LVH, EF 55-60%, Normal WM, Gr 1 DD; Mild MR   Family History Family History  Problem Relation Age of Onset   Heart attack Mother 24   Cancer Father 64   Cancer Maternal Grandfather    Cancer Paternal Grandmother    Cancer Paternal Grandfather    Diabetes Son    Liver cancer Brother    Bone cancer Brother    Heart attack Son    Hypertension Son    Diabetes Son    Hyperlipidemia Son    Colon cancer Neg Hx    Ovarian cancer Neg Hx    Uterine cancer Neg Hx     Social History Social History   Tobacco Use   Smoking status: Former    Current  packs/day: 0.00    Average packs/day: 1 pack/day for 55.0 years (55.0 ttl pk-yrs)    Types: Cigarettes    Start date: 09/29/1949    Quit date: 09/29/2006    Years since quitting: 16.9   Smokeless tobacco: Never  Vaping Use   Vaping status: Never Used  Substance Use Topics   Alcohol use: No   Drug use: No   Allergies Haldol [haloperidol], Codeine sulfate, and Morphine sulfate  Review of Systems Review of Systems  Unable to perform ROS: Dementia    Physical Exam Vital Signs  I have reviewed the triage vital signs BP (!) 149/65   Pulse 75   Temp 99.2 F (37.3 C) (Axillary)   Resp (!) 24   Ht 5\' 5"  (1.651 m)   SpO2 99%   BMI 27.81 kg/m  Physical Exam Vitals and nursing note reviewed.  Constitutional:      General: She is not in acute distress.    Appearance: Normal appearance.  HENT:     Head: Normocephalic and atraumatic.     Right Ear: External ear normal.     Left Ear: External ear normal.     Nose: Nose normal.     Mouth/Throat:     Mouth: Mucous membranes are dry.  Eyes:     General: No scleral icterus.       Right eye: No discharge.        Left eye: No discharge.     Comments: Pt refusing to open her eyes, forced lid closing   Cardiovascular:     Rate and Rhythm: Normal rate and regular rhythm.     Pulses: Normal pulses.     Heart sounds: Normal heart sounds.  Pulmonary:     Effort: Pulmonary effort is normal. No respiratory distress.     Breath sounds: Normal breath sounds. No stridor.  Abdominal:     General: Abdomen is flat. There is no distension.     Palpations: Abdomen is soft.     Tenderness: There is no abdominal tenderness.  Musculoskeletal:     Cervical back: No  rigidity.     Right lower leg: No edema.     Left lower leg: No edema.  Skin:    General: Skin is warm and dry.     Capillary Refill: Capillary refill takes less than 2 seconds.  Neurological:     Mental Status: She is alert.     Comments: Nonverbal  She localizes in all 4  extremities Not following commands Is moving all of her extremity spontaneously  Psychiatric:        Mood and Affect: Mood normal.        Behavior: Behavior normal. Behavior is cooperative.     ED Results and Treatments Labs (all labs ordered are listed, but only abnormal results are displayed) Labs Reviewed  COMPREHENSIVE METABOLIC PANEL - Abnormal; Notable for the following components:      Result Value   CO2 21 (*)    Glucose, Bld 143 (*)    Albumin 3.1 (*)    All other components within normal limits  BRAIN NATRIURETIC PEPTIDE - Abnormal; Notable for the following components:   B Natriuretic Peptide 182.1 (*)    All other components within normal limits  CK - Abnormal; Notable for the following components:   Total CK 26 (*)    All other components within normal limits  I-STAT CHEM 8, ED - Abnormal; Notable for the following components:   Glucose, Bld 136 (*)    Calcium, Ion 1.11 (*)    TCO2 21 (*)    All other components within normal limits  RESP PANEL BY RT-PCR (RSV, FLU A&B, COVID)  RVPGX2  CBC WITH DIFFERENTIAL/PLATELET  URINALYSIS, ROUTINE W REFLEX MICROSCOPIC  LAMOTRIGINE LEVEL                                                                                                                          Radiology CT Head Wo Contrast Result Date: 08/20/2023 CLINICAL DATA:  Seizure disorder, clinical change delirium EXAM: CT HEAD WITHOUT CONTRAST TECHNIQUE: Contiguous axial images were obtained from the base of the skull through the vertex without intravenous contrast. RADIATION DOSE REDUCTION: This exam was performed according to the departmental dose-optimization program which includes automated exposure control, adjustment of the mA and/or kV according to patient size and/or use of iterative reconstruction technique. COMPARISON:  CT head 07/05/2023. FINDINGS: Mildly motion limited study. Brain: No evidence of acute infarction, hemorrhage, hydrocephalus, extra-axial collection  or mass lesion/mass effect. Moderate patchy white matter hypodensities, nonspecific but compatible with chronic microvascular ischemic change. Vascular: No hyperdense vessel.  Calcific atherosclerosis. Skull: No acute fracture. Sinuses/Orbits: Clear sinuses.  No acute orbital findings. Other: No mastoid effusions. IMPRESSION: No evidence of acute intracranial abnormality. Electronically Signed   By: Feliberto Harts M.D.   On: 08/20/2023 02:53   DG Chest Portable 1 View Result Date: 08/20/2023 CLINICAL DATA:  Seizure. EXAM: PORTABLE CHEST 1 VIEW COMPARISON:  07/05/2023 FINDINGS: Patient is rotated. As visualized, the heart size and pulmonary vascularity appear normal. Lungs  are clear. No pleural effusions. No pneumothorax. Mediastinal contours appear intact. Calcification of the aorta. Degenerative changes in the spine and shoulders. IMPRESSION: No evidence of active pulmonary disease. Electronically Signed   By: Burman Nieves M.D.   On: 08/20/2023 01:01    Pertinent labs & imaging results that were available during my care of the patient were reviewed by me and considered in my medical decision making (see MDM for details).  Medications Ordered in ED Medications  lamoTRIgine (LAMICTAL) tablet 75 mg (has no administration in time range)  sodium chloride 0.9 % bolus 500 mL (0 mLs Intravenous Stopped 08/20/23 0344)                                                                                                                                     Procedures Procedures  (including critical care time)  Medical Decision Making / ED Course    Medical Decision Making:    Michelle [name] is a 89 y.o. female with past medical history as below, significant for CAD, COPD, HLD, HTN, seizure disorder on Lamictal, dementia, hospice who presents to the ED with complaint of possible seizure. The complaint involves an extensive differential diagnosis and also carries with it a high risk of complications and  morbidity.  Serious etiology was considered. Ddx includes but is not limited to: Differential diagnoses for altered mental status includes but is not exclusive to alcohol, illicit or prescription medications, intracranial pathology such as stroke, intracerebral hemorrhage, fever or infectious causes including sepsis, hypoxemia, uremia, trauma, endocrine related disorders such as diabetes, hypoglycemia, thyroid-related diseases, etc.   Complete initial physical exam performed, notably the patient was in no distress, no ongoing seizure.    Reviewed and confirmed nursing documentation for past medical history, family history, social history.  Vital signs reviewed.      Clinical Course as of 08/20/23 8295  Wynelle Link Aug 20, 2023  0128 Son reports seizure for around 5 mins, resolved with intranasal diazepam.  [SG]  0344 Back to baseline [SG]  0522 Spoke w/ Dr Derry Lory, recommend increase lamictal to 75mg  BID (150 mg total) and f/u with neuro in office [SG]    Clinical Course User Index [SG] Sloan Leiter, DO    Brief summary: 89 yo female hx dementia, seizure disorder, hospice, as above here w/ breakthrough seizure. Witnessed by son resolved after IN diazepam x2.  Seizure similar to prior, lasting several minutes, jerking bilateral extremity movements.  There was concern that she may need to have increased dose of lamictal while she is hospitalized previously, was not increased in outpatient setting per son at bedside. Follows w/ [place] neurology, has not seen them since recent admission. Lamictal 25mg  bid.  Spoke with neuro, increase lamictal as above, new rx sent. Labs/imaging here are stable. She is back to baseline. Plan to discharge home and f/u in office with neuro.  The patient improved significantly and was  discharged in stable condition. Detailed discussions were had with the patient/guardian regarding current findings, and need for close f/u with PCP or on call doctor. The  patient/guardian has been instructed to return immediately if the symptoms worsen in any way for re-evaluation. Patient/guardian verbalized understanding and is in agreement with current care plan. All questions answered prior to discharge.              Additional history obtained: -Additional history obtained from family -External records from outside source obtained and reviewed including: Chart review including previous notes, labs, imaging, consultation notes including  Recent admission Prior labs/imaging Home meds   Lab Tests: -I ordered, reviewed, and interpreted labs.   The pertinent results include:   Labs Reviewed  COMPREHENSIVE METABOLIC PANEL - Abnormal; Notable for the following components:      Result Value   CO2 21 (*)    Glucose, Bld 143 (*)    Albumin 3.1 (*)    All other components within normal limits  BRAIN NATRIURETIC PEPTIDE - Abnormal; Notable for the following components:   B Natriuretic Peptide 182.1 (*)    All other components within normal limits  CK - Abnormal; Notable for the following components:   Total CK 26 (*)    All other components within normal limits  I-STAT CHEM 8, ED - Abnormal; Notable for the following components:   Glucose, Bld 136 (*)    Calcium, Ion 1.11 (*)    TCO2 21 (*)    All other components within normal limits  RESP PANEL BY RT-PCR (RSV, FLU A&B, COVID)  RVPGX2  CBC WITH DIFFERENTIAL/PLATELET  URINALYSIS, ROUTINE W REFLEX MICROSCOPIC  LAMOTRIGINE LEVEL    Notable for labs  EKG   EKG Interpretation Date/Time:  Sunday August 20 2023 00:36:20 EST Ventricular Rate:  82 PR Interval:  246 QRS Duration:  106 QT Interval:  384 QTC Calculation: 449 R Axis:   -29  Text Interpretation: Sinus rhythm Prolonged PR interval Borderline left axis deviation Abnormal R-wave progression, early transition Borderline T wave abnormalities Confirmed by Zadie Rhine ([id number]) on 08/20/2023 12:44:21 AM         Imaging  Studies ordered: I ordered imaging studies including cth I independently visualized the following imaging with scope of interpretation limited to determining acute life threatening conditions related to emergency care; findings noted above I independently visualized and interpreted imaging. I agree with the radiologist interpretation   Medicines ordered and prescription drug management: Meds ordered this encounter  Medications   sodium chloride 0.9 % bolus 500 mL   lamoTRIgine (LAMICTAL) 25 MG tablet    Sig: Take 3 tablets (75 mg total) by mouth 2 (two) times daily.    Dispense:  180 tablet    Refill:  0   lamoTRIgine (LAMICTAL) tablet 75 mg    -I have reviewed the patients home medicines and have made adjustments as needed   Consultations Obtained: I requested consultation with the neuro,  and discussed lab and imaging findings as well as pertinent plan - they recommend: as above   Cardiac Monitoring: The patient was maintained on a cardiac monitor.  I personally viewed and interpreted the cardiac monitored which showed an underlying rhythm of: nsr Continuous pulse oximetry interpreted by myself, 99% on RA.    Social Determinants of Health:  Diagnosis or treatment significantly limited by social determinants of health: former smoker   Reevaluation: After the interventions noted above, I reevaluated the patient and found that they have improved  Co morbidities that complicate the patient evaluation  Past Medical History:  Diagnosis Date   Anginal pain (HCC)    Arthritis    AV block, 1st degree    CAD (coronary artery disease) 1990; 2015   Cardiac cath 1990 with Dr. Riley Kill and [name] Residual CAD, non obstructive per 2015 cath. STEMI status post stent in August 2015.   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    Final Clinical Impression(s) / ED Diagnoses Final diagnoses:  Breakthrough seizure (HCC)        Sloan Leiter, DO 08/20/23 [phone]

## 2023-08-20 NOTE — Discharge Instructions (Addendum)
It was a pleasure caring for you today in the emergency department.  Michelle [name] was given morning dose of LAMICTAL (seizure medicine) while in the ER. You can give her night time dose this evening as scheduled at increased dosage (75mg ). Please follow up with neurology in next 2 weeks.   The urinalysis did not show any evidence of infection.  Please return to the emergency department for any worsening or worrisome symptoms.     Per [facility] statutes, patients with seizures are not allowed to drive until they have been seizure-free for six months.  Other recommendations include using caution when using heavy equipment or power tools. Avoid working on ladders or at heights. Take showers instead of baths.  Do not swim alone.  Ensure the water temperature is not too high on the home water heater. Do not go swimming alone. Do not lock yourself in a room alone (i.e. bathroom). When caring for infants or small children, sit down when holding, feeding, or changing them to minimize risk of injury to the child in the event you have a seizure. Maintain good sleep hygiene. Avoid alcohol.  Also recommend adequate sleep, hydration, good diet and minimize stress.     During the Seizure   - First, ensure adequate ventilation and place patients on the floor on their left side  Loosen clothing around the neck and ensure the airway is patent. If the patient is clenching the teeth, do not force the mouth open with any object as this can cause severe damage - Remove all items from the surrounding that can be hazardous. The patient may be oblivious to what's happening and may not even know what he or she is doing. If the patient is confused and wandering, either gently guide him/her away and block access to outside areas - Reassure the individual and be comforting - Call 911. In most cases, the seizure ends before EMS arrives. However, there are cases when seizures may last over 3 to 5 minutes. Or the  individual may have developed breathing difficulties or severe injuries. If a pregnant patient or a person with diabetes develops a seizure, it is prudent to call an ambulance. - Finally, if the patient does not regain full consciousness, then call EMS. Most patients will remain confused for about 45 to 90 minutes after a seizure, so you must use judgment in calling for help. - Avoid restraints but make sure the patient is in a bed with padded side rails - Place the individual in a lateral position with the neck slightly flexed; this will help the saliva drain from the mouth and prevent the tongue from falling backward - Remove all nearby furniture and other hazards from the area - Provide verbal assurance as the individual is regaining consciousness - Provide the patient with privacy if possible - Call for help and start treatment as ordered by the caregiver   After the Seizure (Postictal Stage)   After a seizure, most patients experience confusion, fatigue, muscle pain and/or a headache. Thus, one should permit the individual to sleep. For the next few days, reassurance is essential. Being calm and helping reorient the person is also of importance.   Most seizures are painless and end spontaneously. Seizures are not harmful to others but can lead to complications such as stress on the lungs, brain and the heart. Individuals with prior lung problems may develop labored breathing and respiratory distress.

## 2023-08-20 NOTE — ED Triage Notes (Signed)
Patient brought in by [facility] EMS from home. EMS stated son said patient had a seizure that lasted about 5-6 minutes and he gave 30mg   Diazepam IN  and wanted to get her checked out. Patient is currently a hospice patient.

## 2023-08-20 NOTE — ED Notes (Signed)
Patient not opening mouth in order to give medication, Dr. Wallace Cullens notified. Per Dr. Wallace Cullens give medication to son who is primary care giver in order to give at home.

## 2023-08-21 DIAGNOSIS — R569 Unspecified convulsions: Secondary | ICD-10-CM | POA: Diagnosis not present

## 2023-08-21 DIAGNOSIS — R55 Syncope and collapse: Secondary | ICD-10-CM | POA: Diagnosis not present

## 2023-08-21 DIAGNOSIS — I10 Essential (primary) hypertension: Secondary | ICD-10-CM | POA: Diagnosis not present

## 2023-08-21 LAB — LAMOTRIGINE LEVEL: Lamotrigine Lvl: 1.8 ug/mL — ABNORMAL LOW (ref 2.0–20.0)

## 2023-09-04 NOTE — Telephone Encounter (Signed)
Forwarding to [place] to re-verify benefits

## 2023-09-10 ENCOUNTER — Other Ambulatory Visit: Payer: Self-pay | Admitting: Family Medicine

## 2023-09-11 ENCOUNTER — Ambulatory Visit: Payer: Medicare HMO | Admitting: Neurology

## 2023-09-14 ENCOUNTER — Emergency Department (HOSPITAL_COMMUNITY): Payer: Medicare HMO

## 2023-09-14 ENCOUNTER — Encounter (HOSPITAL_BASED_OUTPATIENT_CLINIC_OR_DEPARTMENT_OTHER): Payer: Medicare HMO | Admitting: Family

## 2023-09-14 ENCOUNTER — Encounter (HOSPITAL_COMMUNITY): Payer: Self-pay | Admitting: Emergency Medicine

## 2023-09-14 ENCOUNTER — Other Ambulatory Visit: Payer: Self-pay

## 2023-09-14 ENCOUNTER — Inpatient Hospital Stay (HOSPITAL_COMMUNITY)
Admission: EM | Admit: 2023-09-14 | Discharge: 2023-09-16 | DRG: 101 | Disposition: A | Discharge status: Hospice | Payer: Medicare HMO | Attending: Internal Medicine | Admitting: Internal Medicine

## 2023-09-14 ENCOUNTER — Inpatient Hospital Stay (HOSPITAL_COMMUNITY): Payer: Medicare HMO

## 2023-09-14 DIAGNOSIS — Z87891 Personal history of nicotine dependence: Secondary | ICD-10-CM | POA: Diagnosis not present

## 2023-09-14 DIAGNOSIS — Z6827 Body mass index (BMI) 27.0-27.9, adult: Secondary | ICD-10-CM | POA: Diagnosis not present

## 2023-09-14 DIAGNOSIS — S0003XA Contusion of scalp, initial encounter: Secondary | ICD-10-CM | POA: Diagnosis not present

## 2023-09-14 DIAGNOSIS — L89621 Pressure ulcer of left heel, stage 1: Secondary | ICD-10-CM | POA: Diagnosis present

## 2023-09-14 DIAGNOSIS — Z800 Family history of malignant neoplasm of digestive organs: Secondary | ICD-10-CM

## 2023-09-14 DIAGNOSIS — I252 Old myocardial infarction: Secondary | ICD-10-CM

## 2023-09-14 DIAGNOSIS — M48061 Spinal stenosis, lumbar region without neurogenic claudication: Secondary | ICD-10-CM | POA: Diagnosis present

## 2023-09-14 DIAGNOSIS — I440 Atrioventricular block, first degree: Secondary | ICD-10-CM | POA: Diagnosis present

## 2023-09-14 DIAGNOSIS — M4316 Spondylolisthesis, lumbar region: Secondary | ICD-10-CM | POA: Diagnosis not present

## 2023-09-14 DIAGNOSIS — J449 Chronic obstructive pulmonary disease, unspecified: Secondary | ICD-10-CM | POA: Diagnosis present

## 2023-09-14 DIAGNOSIS — K219 Gastro-esophageal reflux disease without esophagitis: Secondary | ICD-10-CM | POA: Diagnosis present

## 2023-09-14 DIAGNOSIS — I959 Hypotension, unspecified: Secondary | ICD-10-CM | POA: Diagnosis not present

## 2023-09-14 DIAGNOSIS — R339 Retention of urine, unspecified: Secondary | ICD-10-CM | POA: Diagnosis present

## 2023-09-14 DIAGNOSIS — Z79899 Other long term (current) drug therapy: Secondary | ICD-10-CM

## 2023-09-14 DIAGNOSIS — G40909 Epilepsy, unspecified, not intractable, without status epilepticus: Principal | ICD-10-CM | POA: Diagnosis present

## 2023-09-14 DIAGNOSIS — Z7401 Bed confinement status: Secondary | ICD-10-CM

## 2023-09-14 DIAGNOSIS — Z794 Long term (current) use of insulin: Secondary | ICD-10-CM

## 2023-09-14 DIAGNOSIS — I2510 Atherosclerotic heart disease of native coronary artery without angina pectoris: Secondary | ICD-10-CM | POA: Diagnosis present

## 2023-09-14 DIAGNOSIS — L89151 Pressure ulcer of sacral region, stage 1: Secondary | ICD-10-CM | POA: Diagnosis present

## 2023-09-14 DIAGNOSIS — F0390 Unspecified dementia without behavioral disturbance: Secondary | ICD-10-CM | POA: Diagnosis present

## 2023-09-14 DIAGNOSIS — M5136 Other intervertebral disc degeneration, lumbar region with discogenic back pain only: Secondary | ICD-10-CM | POA: Diagnosis not present

## 2023-09-14 DIAGNOSIS — R531 Weakness: Secondary | ICD-10-CM | POA: Diagnosis not present

## 2023-09-14 DIAGNOSIS — I700 Atherosclerosis of aorta: Secondary | ICD-10-CM | POA: Diagnosis not present

## 2023-09-14 DIAGNOSIS — R569 Unspecified convulsions: Principal | ICD-10-CM

## 2023-09-14 DIAGNOSIS — G9340 Encephalopathy, unspecified: Secondary | ICD-10-CM | POA: Diagnosis not present

## 2023-09-14 DIAGNOSIS — Z8249 Family history of ischemic heart disease and other diseases of the circulatory system: Secondary | ICD-10-CM | POA: Diagnosis not present

## 2023-09-14 DIAGNOSIS — M47815 Spondylosis without myelopathy or radiculopathy, thoracolumbar region: Secondary | ICD-10-CM | POA: Diagnosis not present

## 2023-09-14 DIAGNOSIS — E872 Acidosis, unspecified: Secondary | ICD-10-CM | POA: Diagnosis present

## 2023-09-14 DIAGNOSIS — E441 Mild protein-calorie malnutrition: Secondary | ICD-10-CM | POA: Diagnosis not present

## 2023-09-14 DIAGNOSIS — Z515 Encounter for palliative care: Secondary | ICD-10-CM

## 2023-09-14 DIAGNOSIS — I10 Essential (primary) hypertension: Secondary | ICD-10-CM | POA: Diagnosis not present

## 2023-09-14 DIAGNOSIS — E119 Type 2 diabetes mellitus without complications: Secondary | ICD-10-CM | POA: Diagnosis present

## 2023-09-14 DIAGNOSIS — M47816 Spondylosis without myelopathy or radiculopathy, lumbar region: Secondary | ICD-10-CM | POA: Diagnosis not present

## 2023-09-14 DIAGNOSIS — Z66 Do not resuscitate: Secondary | ICD-10-CM | POA: Diagnosis present

## 2023-09-14 DIAGNOSIS — Z7982 Long term (current) use of aspirin: Secondary | ICD-10-CM

## 2023-09-14 DIAGNOSIS — Z885 Allergy status to narcotic agent status: Secondary | ICD-10-CM

## 2023-09-14 DIAGNOSIS — M5126 Other intervertebral disc displacement, lumbar region: Secondary | ICD-10-CM | POA: Diagnosis not present

## 2023-09-14 DIAGNOSIS — R9431 Abnormal electrocardiogram [ECG] [EKG]: Secondary | ICD-10-CM | POA: Diagnosis not present

## 2023-09-14 DIAGNOSIS — E785 Hyperlipidemia, unspecified: Secondary | ICD-10-CM | POA: Diagnosis present

## 2023-09-14 DIAGNOSIS — Z955 Presence of coronary angioplasty implant and graft: Secondary | ICD-10-CM

## 2023-09-14 DIAGNOSIS — Z833 Family history of diabetes mellitus: Secondary | ICD-10-CM | POA: Diagnosis not present

## 2023-09-14 DIAGNOSIS — E440 Moderate protein-calorie malnutrition: Secondary | ICD-10-CM | POA: Diagnosis not present

## 2023-09-14 DIAGNOSIS — L8990 Pressure ulcer of unspecified site, unspecified stage: Secondary | ICD-10-CM | POA: Insufficient documentation

## 2023-09-14 DIAGNOSIS — Z7951 Long term (current) use of inhaled steroids: Secondary | ICD-10-CM

## 2023-09-14 DIAGNOSIS — Z7984 Long term (current) use of oral hypoglycemic drugs: Secondary | ICD-10-CM

## 2023-09-14 DIAGNOSIS — Z888 Allergy status to other drugs, medicaments and biological substances status: Secondary | ICD-10-CM

## 2023-09-14 DIAGNOSIS — Z809 Family history of malignant neoplasm, unspecified: Secondary | ICD-10-CM

## 2023-09-14 LAB — URINALYSIS, ROUTINE W REFLEX MICROSCOPIC
Bilirubin Urine: NEGATIVE
Glucose, UA: NEGATIVE mg/dL
Hgb urine dipstick: NEGATIVE
Ketones, ur: NEGATIVE mg/dL
Leukocytes,Ua: NEGATIVE
Nitrite: NEGATIVE
Protein, ur: 30 mg/dL — AB
Specific Gravity, Urine: 1.02 (ref 1.005–1.030)
pH: 5 (ref 5.0–8.0)

## 2023-09-14 LAB — CBC
HCT: 45.1 % (ref 36.0–46.0)
Hemoglobin: 14.6 g/dL (ref 12.0–15.0)
MCH: 27.7 pg (ref 26.0–34.0)
MCHC: 32.4 g/dL (ref 30.0–36.0)
MCV: 85.4 fL (ref 80.0–100.0)
Platelets: 219 10*3/uL (ref 150–400)
RBC: 5.28 MIL/uL — ABNORMAL HIGH (ref 3.87–5.11)
RDW: 15.4 % (ref 11.5–15.5)
WBC: 5.6 10*3/uL (ref 4.0–10.5)
nRBC: 0 % (ref 0.0–0.2)

## 2023-09-14 LAB — COMPREHENSIVE METABOLIC PANEL
ALT: 23 U/L (ref 0–44)
AST: 27 U/L (ref 15–41)
Albumin: 3.2 g/dL — ABNORMAL LOW (ref 3.5–5.0)
Alkaline Phosphatase: 58 U/L (ref 38–126)
Anion gap: 15 (ref 5–15)
BUN: 16 mg/dL (ref 8–23)
CO2: 16 mmol/L — ABNORMAL LOW (ref 22–32)
Calcium: 9.7 mg/dL (ref 8.9–10.3)
Chloride: 108 mmol/L (ref 98–111)
Creatinine, Ser: 0.65 mg/dL (ref 0.44–1.00)
GFR, Estimated: >60 mL/min (ref >60)
Glucose, Bld: 124 mg/dL — ABNORMAL HIGH (ref 70–99)
Potassium: 3.5 mmol/L (ref 3.5–5.1)
Sodium: 139 mmol/L (ref 135–145)
Total Bilirubin: 0.6 mg/dL (ref 0.0–1.2)
Total Protein: 6.7 g/dL (ref 6.5–8.1)

## 2023-09-14 LAB — PROTIME-INR
INR: 1.1 (ref 0.8–1.2)
Prothrombin Time: 14.2 s (ref 11.4–15.2)

## 2023-09-14 LAB — CBG MONITORING, ED: Glucose-Capillary: 141 mg/dL — ABNORMAL HIGH (ref 70–99)

## 2023-09-14 MED ORDER — SODIUM CHLORIDE 0.9 % IV BOLUS
500.0000 mL | Freq: Once | INTRAVENOUS | Status: AC
  Administered 2023-09-14: 500 mL via INTRAVENOUS

## 2023-09-14 MED ORDER — HYDROMORPHONE HCL 1 MG/ML IJ SOLN
0.5000 mg | Freq: Once | INTRAMUSCULAR | Status: AC
  Administered 2023-09-14: 0.5 mg via INTRAVENOUS
  Filled 2023-09-14: qty 1

## 2023-09-14 MED ORDER — SODIUM CHLORIDE 0.9 % IV SOLN
75.0000 mg | Freq: Two times a day (BID) | INTRAVENOUS | Status: DC
  Administered 2023-09-14: 75 mg via INTRAVENOUS
  Filled 2023-09-14 (×3): qty 7.5

## 2023-09-14 MED ORDER — LIDOCAINE 5 % EX PTCH
1.0000 | MEDICATED_PATCH | CUTANEOUS | Status: DC
  Administered 2023-09-14 – 2023-09-15 (×2): 1 via TRANSDERMAL
  Filled 2023-09-14 (×2): qty 1

## 2023-09-14 MED ORDER — MIDAZOLAM HCL 2 MG/2ML IJ SOLN
2.0000 mg | Freq: Once | INTRAMUSCULAR | Status: AC
  Administered 2023-09-14: 2 mg via INTRAVENOUS
  Filled 2023-09-14: qty 2

## 2023-09-14 MED ORDER — ACETAMINOPHEN 325 MG PO TABS
650.0000 mg | ORAL_TABLET | Freq: Once | ORAL | Status: DC
  Filled 2023-09-14: qty 2

## 2023-09-14 MED ORDER — LAMOTRIGINE 25 MG PO TABS
75.0000 mg | ORAL_TABLET | Freq: Two times a day (BID) | ORAL | Status: DC
  Administered 2023-09-14: 75 mg via ORAL
  Filled 2023-09-14: qty 3

## 2023-09-14 MED ORDER — ACETAMINOPHEN 325 MG PO TABS
650.0000 mg | ORAL_TABLET | Freq: Four times a day (QID) | ORAL | Status: DC | PRN
  Filled 2023-09-14: qty 2

## 2023-09-14 MED ORDER — LAMOTRIGINE 25 MG PO TABS: 100.0000 mg | ORAL_TABLET | Freq: Every day | ORAL | Status: DC

## 2023-09-14 MED ORDER — LAMOTRIGINE 25 MG PO TABS: 75.0000 mg | ORAL_TABLET | Freq: Every day | ORAL | Status: DC

## 2023-09-14 MED ORDER — IBUPROFEN 200 MG PO TABS
600.0000 mg | ORAL_TABLET | Freq: Once | ORAL | Status: DC
  Filled 2023-09-14: qty 1

## 2023-09-14 NOTE — Consult Note (Signed)
 NEUROLOGY CONSULT NOTE   Date of service: September 14, 2023 Patient Name: Michelle [name] MRN:  [id number] DOB:  1933-01-06 Chief Complaint: "Seizure activity with postictal drowsiness" Requesting Provider: Ginnie Smart, MD  History of Present Illness  Michelle [name] is a 89 y.o. female with hx of CAD, COPD, subarachnoid hemorrhage and seizures, bedbound at baseline and on home hospice who presents with a breakthrough seizure which occurred at about 1 AM.  [name] heard patient making noise and checked on her to find her having one of her typical seizures, which consist of gaze fixation, jaw clenching and generalized twitching and jerking movements.  He administered 2 doses of intranasal Valtoco, and seizure activity decreased but did not stop.  He then called EMS, and patient was given a dose of Versed.  She received another dose of Versed prior to imaging here at the hospital.  Son is unsure exactly how long the seizure lasted.  Patient often has postictal confusion and drowsiness.  Patient's family states that her orientation fluctuates at baseline but she is usually alert and able to respond to her name and carry on a confused conversation.  She is often confused about place, time and situation.  She does get more confused at night and will sometimes stay up all night and sleep all day.  Family states that she has breakthrough seizures about once every few weeks.  She has not missed any doses of her Lamictal per family.  She did complain of some back pain post seizures. Per ED provider, patient may have had some abnormal mouth movements earlier, but none were seen when I saw the patient.  ROS   Unable to ascertain due to altered mental status  Past History   Past Medical History:  Diagnosis Date   Anginal pain (HCC)    Arthritis    AV block, 1st degree    CAD (coronary artery disease) 1990; 2015   Cardiac cath 1990 with Dr. Riley Kill and pt reports blockage in artery  with  angioplasty. She has pictures that show severe stenosis mid RCA and a post PTCA picture with 30% residual stenosis post PTCA. Residual CAD, non obstructive per 2015 cath. STEMI status post stent in August 2015.   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   Promus DES to RCA with STEMI   Shortness of breath     Past Surgical History:  Procedure Laterality Date   CATARACT EXTRACTION  2020   right and left eye    CHOLECYSTECTOMY     thinks her appendix was removed at the same time   CORONARY ANGIOPLASTY WITH STENT PLACEMENT  02/18/14   Promus DES to RCA   LEFT HEART CATHETERIZATION WITH CORONARY ANGIOGRAM N/A 02/18/2014   Procedure: LEFT HEART CATHETERIZATION WITH CORONARY ANGIOGRAM;  Surgeon: Corky Crafts, MD;  Location: [facility] CATH LAB;  Service: Cardiovascular;  Laterality: N/A;   PTCA  1990   PTCA of RCA   TRANSTHORACIC ECHOCARDIOGRAM  02/02/2012   mild LVH, EF 55-60%, Normal WM, Gr 1 DD; Mild MR    Family History: Family History  Problem Relation Age of Onset   Heart attack Mother 68  Cancer Father 71   Cancer Maternal Grandfather    Cancer Paternal Grandmother    Cancer Paternal Grandfather    Diabetes Son    Liver cancer Brother    Bone cancer Brother    Heart attack Son    Hypertension Son    Diabetes Son    Hyperlipidemia Son    Colon cancer Neg Hx    Ovarian cancer Neg Hx    Uterine cancer Neg Hx     Social History  reports that she quit smoking about 16 years ago. Her smoking use included cigarettes. She started smoking about 72 years ago. She has a 55 pack-year smoking history. She has never used smokeless tobacco. She reports that she does not drink  alcohol and does not use drugs.  Allergies  Allergen Reactions   Codeine Sulfate Nausea Only   Haldol [Haloperidol] Anxiety   Morphine Sulfate Nausea Only    Medications   Current Facility-Administered Medications:    acetaminophen (TYLENOL) tablet 650 mg, 650 mg, Oral, Once, Trifan, Kermit Balo, MD   ibuprofen (ADVIL) tablet 600 mg, 600 mg, Oral, Once, Trifan, Kermit Balo, MD   lamoTRIgine (LAMICTAL) tablet 75 mg, 75 mg, Oral, BID, Terald Sleeper, MD, 75 mg at 09/14/23 1610  Current Outpatient Medications:    acetaminophen (TYLENOL) 650 MG CR tablet, Take 1,300 mg by mouth 3 (three) times daily., Disp: , Rfl:    albuterol (PROVENTIL) (2.5 MG/3ML) 0.083% nebulizer solution, Take 2.5 mg by nebulization every 6 (six) hours as needed for wheezing or shortness of breath., Disp: , Rfl:    Alcohol Swabs (DROPSAFE ALCOHOL PREP) 70 % PADS, USE THREE TIMES DAILY, Disp: 300 each, Rfl: 3   amLODipine (NORVASC) 2.5 MG tablet, Take 2.5 mg by mouth 2 (two) times daily., Disp: , Rfl:    amLODipine (NORVASC) 5 MG tablet, Take 1 tablet (5 mg total) by mouth daily. (Patient not taking: Reported on 08/20/2023), Disp: , Rfl:    aspirin 81 MG chewable tablet, Chew 81 mg by mouth daily., Disp: , Rfl:    carvedilol (COREG) 12.5 MG tablet, Take 1 tablet (12.5 mg total) by mouth 2 (two) times daily., Disp: 180 tablet, Rfl: 3   cholecalciferol (VITAMIN D3) 25 MCG (1000 UNIT) tablet, Take 1,000 Units by mouth daily with breakfast., Disp: , Rfl:    enalapril (VASOTEC) 20 MG tablet, Take 1 tablet (20 mg total) by mouth at bedtime., Disp: 90 tablet, Rfl: 3   fenofibrate micronized (LOFIBRA) 134 MG capsule, TAKE 1 CAPSULE EVERY DAY, Disp: 90 capsule, Rfl: 3   ferrous sulfate 325 (65 FE) MG tablet, Twice a week (Patient taking differently: Take 325 mg by mouth 2 (two) times a week. Tuesday  and Friday), Disp: 18 tablet, Rfl: 3   fluticasone-salmeterol (ADVAIR) 250-50 MCG/ACT AEPB, INHALE 1 PUFF TWICE DAILY, Disp: 180 each,  Rfl: 3   furosemide (LASIX) 20 MG tablet, Take 1 tablet (20 mg total) by mouth as needed for fluid or edema. (Patient not taking: Reported on 08/20/2023), Disp: 90 tablet, Rfl: 2   icosapent Ethyl (VASCEPA) 1 g capsule, Take 2 capsules (2 g total) by mouth 2 (two) times daily. (Patient taking differently: Take 1-2 g by mouth See admin instructions. Take 1 gram in the morning and afternoon. Take 2 g at bedtime), Disp: 360 capsule, Rfl: 3   lamoTRIgine (LAMICTAL) 25 MG tablet, Take 3 tablets (75 mg total) by mouth 2 (two) times daily., Disp: 180 tablet, Rfl: 0   LANTUS  SOLOSTAR 100 UNIT/ML Solostar Pen, Inject 20 Units into the skin in the morning., Disp: , Rfl:    loperamide (IMODIUM A-D) 2 MG tablet, Take 2-4 mg by mouth 2 (two) times daily as needed for diarrhea or loose stools., Disp: , Rfl:    loratadine (CLARITIN) 10 MG tablet, Take 10 mg by mouth every other day., Disp: , Rfl:    medroxyPROGESTERone (PROVERA) 10 MG tablet, Take 1 tablet (10 mg total) by mouth daily. (Patient taking differently: Take 10 mg by mouth at bedtime.), Disp: 30 tablet, Rfl: 3   metFORMIN (GLUCOPHAGE-XR) 500 MG 24 hr tablet, Take 500 mg by mouth daily. (Patient not taking: Reported on 08/20/2023), Disp: , Rfl:    nortriptyline (PAMELOR) 10 MG capsule, TAKE 1 CAPSULE BY MOUTH AT BEDTIME AS NEEDED FOR PAIN PREVENTION, Disp: 30 capsule, Rfl: 1   pantoprazole (PROTONIX) 20 MG tablet, Take 20 mg by mouth daily., Disp: , Rfl:    pantoprazole (PROTONIX) 40 MG tablet, TAKE 1 TABLET EVERY DAY (Patient not taking: Reported on 08/20/2023), Disp: 90 tablet, Rfl: 3   polyethylene glycol (MIRALAX / GLYCOLAX) 17 g packet, Take 17 g by mouth daily. (Patient taking differently: Take 17 g by mouth daily as needed for mild constipation, moderate constipation or severe constipation.), Disp: 14 each, Rfl: 0   QUEtiapine (SEROQUEL) 25 MG tablet, Take 0.5 tablets (12.5 mg total) by mouth every evening. Between 1830 and 1900, Disp: , Rfl:     rosuvastatin (CRESTOR) 40 MG tablet, Take 1 tablet (40 mg total) by mouth daily., Disp: 90 tablet, Rfl: 3   sertraline (ZOLOFT) 50 MG tablet, TAKE 2 TABLETS AT BEDTIME, Disp: 180 tablet, Rfl: 3   SPIRIVA HANDIHALER 18 MCG inhalation capsule, PLACE 1 CAPSULE INTO HANDIHALER AND INHALE THE CONTENTS DAILY, Disp: 90 capsule, Rfl: 3   TRUEplus Lancets 33G MISC, TEST BLOOD SUGAR UP TO FOUR TIMES DAILY AS DIRECTED, Disp: 400 each, Rfl: 3  Vitals   Vitals:   09/14/23 0945 09/14/23 1000 09/14/23 1015 09/14/23 1145  BP: (!) 176/77 (!) 187/84 (!) 185/71 (!) 166/69  Pulse: 75 76 72 70  Resp: 20 19 (!) 22 17  Temp:  97.7 F (36.5 C)    TempSrc:  Oral    SpO2: 97% 100% 100% 99%  Weight:      Height:        Body mass index is 27.81 kg/m.  Physical Exam   Constitutional: Chronically ill-appearing elderly patient in no acute distress Psych: Affect blunted Eyes: No scleral injection.  HENT: No OP obstruction.  Head: Normocephalic.  Cardiovascular: Normal rate and regular rhythm.  Respiratory: Effort normal, non-labored breathing.  Skin: WDI.   Neurologic Examination    NEURO:  Mental Status: Drowsy but responds to loud voice and touch.  Able to state her first name and that she is in the hospital Speech/Language: speech is in single words and short phrases with mild dysarthria  Cranial Nerves:  II: PERRL.  III, IV, VI: Does not focus or track examiner, rests with eyes closed, some resistance to forced eye opening VII: Face is symmetrical resting and smiling VIII: hearing intact to voice. IX, X: Voice is slightly dysarthric XII: Noncooperative with tongue protrusion Motor: Able to move bilateral upper extremities to command but does not lift them off the bed.  Moves bilateral lower extremities to mild noxious but also does not lift them off the bed Tone: is normal and bulk is normal Sensation- Intact to light touch bilaterally.  Coordination: Unable to perform Gait-  deferred   Labs/Imaging/Neurodiagnostic studies   CBC:  Recent Labs  Lab 15-Sep-2023 0143  WBC 5.6  HGB 14.6  HCT 45.1  MCV 85.4  PLT 219   Basic Metabolic Panel:  Lab Results  Component Value Date   NA 139 2023-09-15   K 3.5 09-15-23   CO2 16 (L) 09/15/23   GLUCOSE 124 (H) 15-Sep-2023   BUN 16 September 15, 2023   CREATININE 0.65 September 15, 2023   CALCIUM 9.7 2023/09/15   GFRNONAA >60 15-Sep-2023   GFRAA 77 07/01/2020   Lipid Panel:  Lab Results  Component Value Date   LDLCALC 32 08/19/2022   HgbA1c:  Lab Results  Component Value Date   HGBA1C 6.4 06/12/2023   Urine Drug Screen:     Component Value Date/Time   LABOPIA NONE DETECTED 06/14/2023 1545   COCAINSCRNUR NONE DETECTED 06/14/2023 1545   LABBENZ NONE DETECTED 06/14/2023 1545   AMPHETMU NONE DETECTED 06/14/2023 1545   THCU NONE DETECTED 06/14/2023 1545   LABBARB NONE DETECTED 06/14/2023 1545    Alcohol Level     Component Value Date/Time   ETH <10 06/14/2023 1630   INR  Lab Results  Component Value Date   INR 1.1 Sep 15, 2023   APTT  Lab Results  Component Value Date   APTT 29 06/15/2023   AED levels:  Lab Results  Component Value Date   LAMOTRIGINE 1.8 (L) 08/20/2023    CT Head without contrast(Personally reviewed): Small contusion in left posterior parietal lobe near the vertex, chronic atrophic and ischemic changes  MRI lumbar spine Motion degraded exam with severe spinal canal stenosis at L4-L5 and moderate to severe spinal canal stenosis is L2-L3, combination of degenerative disc bulges and ligamentum flavum hypertrophy, severe bilateral neuroforaminal stenosis at L4-L5  Neurodiagnostics rEEG:  Pending  ASSESSMENT   WOODIE TRUSTY is a 89 y.o. female with hx of CAD, COPD, subarachnoid hemorrhage and seizures who presents with a breakthrough seizure requiring 2 doses of Valtoco given at home as well as 1 dose of Versed given by EMS.  She did complain of some back pain postseizure, but  neurosurgery does not feel this is cauda equina syndrome and does not wish to intervene.  CT head demonstrated a small contusion in the left posterior parietal lobe, likely due to patient striking her head on the bed during seizure activity.  Patient's family states that she has had no recent falls and has been bedbound for over a month.  She is receiving home hospice services.  She may have had some rhythmic mouth movements earlier while in the ED, but this was not seen on my exam.  On exam, patient is drowsy but oriented to name and place.  She is not fully back to baseline, but she has received several doses of benzodiazepines and typically does have some postictal drowsiness and confusion.  Will obtain routine EEG to rule out any continuing seizure activity, but suspect that current altered mental status is a combination of postictal drowsiness and effect of benzodiazepines.  Will increase Lamictal dosing at night given that patient has breakthrough seizures every few weeks.  Increased dosing at night may also help with patient's sleep schedule.  RECOMMENDATIONS  - Routine EEG - Increase lamotrigine to 75 mg in the morning and 100 mg in the evening -- While unable to take p.o. due to somnolence substitute Vimpat 75 mg twice daily -- If EEG is negative and there is no further concern for clinical seizure  activity, neurology will sign off.  Please do not hesitate to reach out if there is additional concern for clinical seizure activity, if patient does not continue to gradually improve, or if other questions or concerns arise  ______________________________________________________________________    Signed, Cortney Harland Dingwall, NP [facility]  Attending Neurologist's note:  I personally saw this patient, gathering history, performing a full neurologic examination, reviewing relevant labs, personally reviewing relevant imaging including head CT and formulated the assessment and plan,  adding the note above for completeness and clarity to accurately reflect my thoughts   Brooke Dare MD-PhD [facility] [phone] Available 7 AM to 7 PM, outside these hours please contact Neurologist on call listed on AMION

## 2023-09-14 NOTE — ED Notes (Signed)
 Attempted catheterization x2 by RN

## 2023-09-14 NOTE — ED Provider Notes (Signed)
 [facility]EM[facility] DEPT [facility] Emergency Department Provider Note MRN:  [id number]  Arrival date & time: 09/14/23     Chief Complaint   Seizures   History of Present Illness   Michelle [name] is a 89 y.o. year-old female with a history of CAD, COPD presenting to the ED with chief complaint of seizure.  Prolonged seizure at home, given 15 mg diazepam at home which is her rescue dose.  Was having some abnormal eye movement with EMS and so was given an additional 5 mg midazolam with EMS.  Now is somnolent.  Has a history of seizure disorder, history of subarachnoid hemorrhage.  Review of Systems  A thorough review of systems was obtained and all systems are negative except as noted in the HPI and PMH.   Patient's Health History    Past Medical History:  Diagnosis Date   Anginal pain (HCC)    Arthritis    AV block, 1st degree    CAD (coronary artery disease) 1990; 2015   Cardiac cath 1990 with Dr. Riley Kill and [name] Residual CAD, non obstructive per 2015 cath. STEMI status post stent in August 2015.   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   Promus DES to RCA with STEMI   Shortness of breath     Past Surgical History:  Procedure Laterality Date   CATARACT EXTRACTION  2020   right and left eye    CHOLECYSTECTOMY     thinks her appendix was removed at the same time   CORONARY ANGIOPLASTY WITH STENT PLACEMENT  02/18/14   Promus DES to RCA   LEFT HEART CATHETERIZATION WITH CORONARY ANGIOGRAM N/A 02/18/2014    Procedure: LEFT HEART CATHETERIZATION WITH CORONARY ANGIOGRAM;  Surgeon: Corky Crafts, MD;  Location: [facility] CATH LAB;  Service: Cardiovascular;  Laterality: N/A;   PTCA  1990   PTCA of RCA   TRANSTHORACIC ECHOCARDIOGRAM  02/02/2012   mild LVH, EF 55-60%, Normal WM, Gr 1 DD; Mild MR    Family History  Problem Relation Age of Onset   Heart attack Mother 33   Cancer Father 51   Cancer Maternal Grandfather    Cancer Paternal Grandmother    Cancer Paternal Grandfather    Diabetes Son    Liver cancer Brother    Bone cancer Brother    Heart attack Son    Hypertension Son    Diabetes Son    Hyperlipidemia Son    Colon cancer Neg Hx    Ovarian cancer Neg Hx    Uterine cancer Neg Hx     Social History   Socioeconomic History   Marital status: Single    Spouse name: Not on file   Number of children: 2   Years of education: Not on file   Highest education level: Not on file  Occupational History   Occupation: Retired    Associate Professor: RETIRED  Tobacco Use   Smoking status: Former    Current packs/day: 0.00    Average packs/day: 1 pack/day for 55.0  years (55.0 ttl pk-yrs)    Types: Cigarettes    Start date: 09/29/1951    Quit date: 09/29/2006    Years since quitting: 16.9   Smokeless tobacco: Never  Vaping Use   Vaping status: Never Used  Substance and Sexual Activity   Alcohol use: No   Drug use: No   Sexual activity: Not Currently  Other Topics Concern   Not on file  Social History Narrative   Lives with her son - 2 very supportive sons   Right handed   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   Drinks caffeine   Social Drivers of Corporate investment banker Strain: Low Risk  (06/26/2023)   Overall Financial Resource Strain (CARDIA)    Difficulty of Paying Living Expenses: Not hard at all  Food Insecurity: Patient Unable To Answer (07/05/2023)   Hunger Vital Sign    Worried About Running Out of Food in the Last Year: Patient unable to answer    Ran Out of Food in the  Last Year: Patient unable to answer  Transportation Needs: Patient Unable To Answer (07/05/2023)   PRAPARE - Transportation    Lack of Transportation (Medical): Patient unable to answer    Lack of Transportation (Non-Medical): Patient unable to answer  Physical Activity: Insufficiently Active (06/26/2023)   Exercise Vital Sign    Days of Exercise per Week: 5 days    Minutes of Exercise per Session: 10 min  Stress: No Stress Concern Present (06/26/2023)   [employer] of Occupational Health - Occupational Stress Questionnaire    Feeling of Stress : Only a little  Social Connections: Socially Isolated (07/17/2023)   Social Connection and Isolation Panel [NHANES]    Frequency of Communication with Friends and Family: Twice a week    Frequency of Social Gatherings with Friends and Family: Once a week    Attends Religious Services: Never    Database administrator or Organizations: No    Attends Banker Meetings: Never    Marital Status: Never married  Intimate Partner Violence: Patient Unable To Answer (07/05/2023)   Humiliation, Afraid, Rape, and Kick questionnaire    Fear of Current or Ex-Partner: Patient unable to answer    Emotionally Abused: Patient unable to answer    Physically Abused: Patient unable to answer    Sexually Abused: Patient unable to answer     Physical Exam   Vitals:   09/14/23 0530 09/14/23 0545  BP: (!) 173/80 (!) 170/75  Pulse: 77 77  Resp: 20 17  Temp:    SpO2: 100% 100%    CONSTITUTIONAL: Ill-appearing, NAD NEURO/PSYCH: Somnolent, withdraws and performs purposeful movements in response to noxious stimuli.  Moves all extremities. EYES:  eyes equal and reactive ENT/NECK:  no LAD, no JVD CARDIO: Regular rate, well-perfused, normal S1 and S2 PULM:  CTAB no wheezing or rhonchi GI/GU:  non-distended, non-tender MSK/SPINE:  No gross deformities, no edema SKIN:  no rash, atraumatic   *Additional and/or pertinent findings included in MDM  below  Diagnostic and Interventional Summary    EKG Interpretation Date/Time:  Thursday September 14 2023 02:14:41 EST Ventricular Rate:  88 PR Interval:  233 QRS Duration:  99 QT Interval:  397 QTC Calculation: 481 R Axis:   -41  Text Interpretation: Sinus rhythm Prolonged PR interval Left anterior fascicular block Consider anterior infarct Confirmed by Kennis Carina [phone]) on 09/14/2023 2:45:54 AM       Labs Reviewed  CBC - Abnormal;  Notable for the following components:      Result Value   RBC 5.28 (*)    All other components within normal limits  COMPREHENSIVE METABOLIC PANEL - Abnormal; Notable for the following components:   CO2 16 (*)    Glucose, Bld 124 (*)    Albumin 3.2 (*)    All other components within normal limits  URINALYSIS, ROUTINE W REFLEX MICROSCOPIC - Abnormal; Notable for the following components:   Protein, ur 30 (*)    Bacteria, UA RARE (*)    All other components within normal limits  CBG MONITORING, ED - Abnormal; Notable for the following components:   Glucose-Capillary 141 (*)    All other components within normal limits  PROTIME-INR    CT HEAD WO CONTRAST ( )  Final Result    DG Chest Port 1 View  Final Result      Medications - No data to display   Procedures  /  Critical Care Procedures  ED Course and Medical Decision Making  Initial Impression and Ddx Patient has a history of dementia, CAD, COPD, more recently seizure disorder related to subarachnoid hemorrhage.  Takes Lamictal.  DNR/DNI according to chart review, unable to reach emergency contacts thus far.  She has a diminished GCS currently but this seems to be due to the benzodiazepine administration.  She is not exhibiting seizure activity anymore.  Will allow time for metabolization, closely monitor.  Repeat CT head.  Past medical/surgical history that increases complexity of ED encounter: Seizure disorder, dementia, CAD, COPD  Interpretation of Diagnostics I personally  reviewed the EKG and my interpretation is as follows: Sinus rhythm  No significant blood count or electrolyte disturbance.  Mild acidosis.  Patient Reassessment and Ultimate Disposition/Management     Family not bedside, patient is on home hospice, DNR/DNI but not on full comfort care, still interested in diagnostics and reasonable interventions.  Patient does have fairly poor quality of life and so the hospice team has been helpful at home.  CT head unremarkable.  During multiple reassessments patient is progressing, getting closer and closer to baseline, not quite there yet.  Plan will be to hopefully discharge when baseline status is achieved.  Signed out to oncoming provider at shift change.  Patient management required discussion with the following services or consulting groups:  None  Complexity of Problems Addressed Acute illness or injury that poses threat of life of bodily function  Additional Data Reviewed and Analyzed Further history obtained from: Further history from spouse/family member  Additional Factors Impacting ED Encounter Risk Consideration of hospitalization  Elmer Sow. Pilar Plate, MD [facility] Emergency Medicine [facility] [email or web address]  Final Clinical Impressions(s) / ED Diagnoses     ICD-10-CM   1. Seizure (HCC)  R56.9     2. Post-ictal state (HCC)  R56.9       ED Discharge Orders     None        Discharge Instructions Discussed with and Provided to Patient:     Discharge Instructions      You were evaluated in the Emergency Department and after careful evaluation, we did not find any emergent condition requiring admission or further testing in the hospital.  Your exam/testing today is overall reassuring.  Symptoms likely due to a seizure.  Recommend continued follow-up with your regular doctors to discuss her symptoms and your seizure management.  Please return to the Emergency Department if you experience any worsening  of your condition.   Thank  you for allowing [place] to be a part of your care.       Sabas Sous, MD 09/14/23 [phone]

## 2023-09-14 NOTE — Progress Notes (Addendum)
  These are curbside recommendations based upon the information readily available in the chart on brief review as well as history and examination information provided to me by requesting provider and do not replace a full detailed consult  This is an 89 year old woman reportedly on hospice with a history of CAD, COPD, seizures with prior SAH on lamotrigine, presenting with a prolonged breakthrough seizure at home but now back to baseline  I am asked to comment just on potential to increase lamotrigine level  I note that her lamotrigine levels have resulted low in the past, would obtain a level to guide future decision making even if it will not result prior to ED discharge  If she's been taking 75 mg BID and tolerating dose well since 2/2 (which is what chart suggests), could increase to 100 mg BID, as long as no other new meds (in particular depakote, carbamazepine, phenytoin, phenobarbital, primidone or anything else enzyme-inducing would interact). If she's pretty sleepy on 75 mg BID, would keep 75 qAM and 100 nightly and she can f/u with her outpatient neurologist   Workup for other etiologies of lowered seizure threshold per ED  8 minutes spent in care of the patient, majority in discussion with Dr. Renaye Rakers via secure chat, no charge note

## 2023-09-14 NOTE — ED Notes (Signed)
 Went to give ordered PO meds for pain, patient unable to follow commands so PO meds held.

## 2023-09-14 NOTE — H&P (Addendum)
 Date: 09/14/2023               Patient Name:  Michelle [name] MRN: [id number]  DOB: 1932-11-20 Age / Sex: 89 y.o., female   PCP: [facility]         Medical Service: Internal Medicine Teaching Service         Attending Physician: Dr. Ninetta Lights, Lacretia Leigh, MD      First Contact: Dr. Morrie Sheldon, MD  Pager: [phone]     Second Contact: Dr. Rudene Christians, DO  Pager: [phone]          After Hours (After 5p/  First Contact Pager: [phone]  weekends / holidays): Second Contact Pager: [phone]   SUBJECTIVE   Chief Complaint: Seizures  History of Present Illness: Michelle [name] is an 89 YO female on home hospice with a history of CAD, COPD, and subarachnoid hemorrhage who presented to the ED for seizures refractory to benzodiazepines.  Patient unable to provide history due to her somnolence.  History provided by patient's son.  This morning, the patient's brother heard shaking from the patient's hospital bed.  He looked at the monitor and noticed that she was having a seizure.  She continued to seize despite 2 doses of benzos, so he called EMS.  Prior to this episode, her baseline mental status was waxing and waning in which she could somewhat carry a conversation in the context of her dementia.  She denied any back pain or troubles voiding prior to this presentation.  Patient receives her medications from her son, and they note good compliance.  ED Course: Remains somnolent.  Plan to place Foley from bladder distention.  MR lumbar spine notable for severe spinal stenosis at L4-L5 and moderate to severe spinal stenosis at L2-L3.  CT head notable for small contusion in the left posterior parietal lobe. Neurosurgery consulted and do not believe patient is a good candidate and likely does not have cauda equina syndrome.  Neurology consulted who will perform stat EEG and may increase lamotrigine dosage.  CBC unremarkable.  CMP notable for acidosis with bicarb of 16.  UA unremarkable.   Lamotrigine level pending.    Meds:  No current facility-administered medications on file prior to encounter.   Current Outpatient Medications on File Prior to Encounter  Medication Sig Dispense Refill   acetaminophen (TYLENOL) 650 MG CR tablet Take 1,300 mg by mouth 3 (three) times daily.     albuterol (PROVENTIL) (2.5 MG/3ML) 0.083% nebulizer solution Take 2.5 mg by nebulization every 6 (six) hours as needed for wheezing or shortness of breath.     Alcohol Swabs (DROPSAFE ALCOHOL PREP) 70 % PADS USE THREE TIMES DAILY 300 each 3   amLODipine (NORVASC) 2.5 MG tablet Take 2.5 mg by mouth 2 (two) times daily.     amLODipine (NORVASC) 5 MG tablet Take 1 tablet (5 mg total) by mouth daily. (Patient not taking: Reported on 08/20/2023)     aspirin 81 MG chewable tablet Chew 81 mg by mouth daily.     carvedilol (COREG) 12.5 MG tablet Take 1 tablet (12.5 mg total) by mouth 2 (two) times daily. 180 tablet 3   cholecalciferol (VITAMIN D3) 25 MCG (1000 UNIT) tablet Take 1,000 Units by mouth daily with breakfast.     enalapril (VASOTEC) 20 MG tablet Take 1 tablet (20 mg total) by mouth at bedtime. 90 tablet 3   fenofibrate micronized (LOFIBRA) 134 MG capsule TAKE 1 CAPSULE EVERY DAY 90 capsule 3  ferrous sulfate 325 (65 FE) MG tablet Twice a week (Patient taking differently: Take 325 mg by mouth 2 (two) times a week. Tuesday  and Friday) 18 tablet 3   fluticasone-salmeterol (ADVAIR) 250-50 MCG/ACT AEPB INHALE 1 PUFF TWICE DAILY 180 each 3   furosemide (LASIX) 20 MG tablet Take 1 tablet (20 mg total) by mouth as needed for fluid or edema. (Patient not taking: Reported on 08/20/2023) 90 tablet 2   icosapent Ethyl (VASCEPA) 1 g capsule Take 2 capsules (2 g total) by mouth 2 (two) times daily. (Patient taking differently: Take 1-2 g by mouth See admin instructions. Take 1 gram in the morning and afternoon. Take 2 g at bedtime) 360 capsule 3   lamoTRIgine (LAMICTAL) 25 MG tablet Take 3 tablets (75 mg total) by  mouth 2 (two) times daily. 180 tablet 0   LANTUS SOLOSTAR 100 UNIT/ML Solostar Pen Inject 20 Units into the skin in the morning.     loperamide (IMODIUM A-D) 2 MG tablet Take 2-4 mg by mouth 2 (two) times daily as needed for diarrhea or loose stools.     loratadine (CLARITIN) 10 MG tablet Take 10 mg by mouth every other day.     medroxyPROGESTERone (PROVERA) 10 MG tablet Take 1 tablet (10 mg total) by mouth daily. (Patient taking differently: Take 10 mg by mouth at bedtime.) 30 tablet 3   metFORMIN (GLUCOPHAGE-XR) 500 MG 24 hr tablet Take 500 mg by mouth daily. (Patient not taking: Reported on 08/20/2023)     nortriptyline (PAMELOR) 10 MG capsule TAKE 1 CAPSULE BY MOUTH AT BEDTIME AS NEEDED FOR PAIN PREVENTION 30 capsule 1   pantoprazole (PROTONIX) 20 MG tablet Take 20 mg by mouth daily.     pantoprazole (PROTONIX) 40 MG tablet TAKE 1 TABLET EVERY DAY (Patient not taking: Reported on 08/20/2023) 90 tablet 3   polyethylene glycol (MIRALAX / GLYCOLAX) 17 g packet Take 17 g by mouth daily. (Patient taking differently: Take 17 g by mouth daily as needed for mild constipation, moderate constipation or severe constipation.) 14 each 0   QUEtiapine (SEROQUEL) 25 MG tablet Take 0.5 tablets (12.5 mg total) by mouth every evening. Between 1830 and 1900     rosuvastatin (CRESTOR) 40 MG tablet Take 1 tablet (40 mg total) by mouth daily. 90 tablet 3   sertraline (ZOLOFT) 50 MG tablet TAKE 2 TABLETS AT BEDTIME 180 tablet 3   SPIRIVA HANDIHALER 18 MCG inhalation capsule PLACE 1 CAPSULE INTO HANDIHALER AND INHALE THE CONTENTS DAILY 90 capsule 3   TRUEplus Lancets 33G MISC TEST BLOOD SUGAR UP TO FOUR TIMES DAILY AS DIRECTED 400 each 3   Past Medical History Past Medical History:  Diagnosis Date   Anginal pain (HCC)    Arthritis    AV block, 1st degree    CAD (coronary artery disease) 1990; 2015   Cardiac cath 1990 with Dr. Riley Kill and pt reports blockage in artery  with angioplasty. She has pictures that show  severe stenosis mid RCA and a post PTCA picture with 30% residual stenosis post PTCA. Residual CAD, non obstructive per 2015 cath. STEMI status post stent in August 2015.   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   Promus DES to RCA with STEMI   Shortness of breath    Past Surgical History:  Procedure Laterality Date   CATARACT EXTRACTION  2020   right and left eye    CHOLECYSTECTOMY     thinks her appendix was removed at the same time   CORONARY ANGIOPLASTY WITH STENT PLACEMENT  02/18/14   Promus DES to RCA   LEFT HEART CATHETERIZATION WITH CORONARY ANGIOGRAM N/A 02/18/2014   Procedure: LEFT HEART CATHETERIZATION WITH CORONARY ANGIOGRAM;  Surgeon: Corky Crafts, MD;  Location: [facility] CATH LAB;  Service: Cardiovascular;  Laterality: N/A;   PTCA  1990   PTCA of RCA   TRANSTHORACIC ECHOCARDIOGRAM  02/02/2012   mild LVH, EF 55-60%, Normal WM, Gr 1 DD; Mild MR    Social:  Lives With: son at home on home hospice  Support: Bed-bound, relies upon son; cannot complete ADLs and iADLs PCP: [place]  Family History: N/A   Allergies: Allergies as of 09/14/2023 - Review Complete 09/14/2023  Allergen Reaction Noted   Codeine sulfate Nausea Only    Haldol [haloperidol] Anxiety 07/05/2023   Morphine sulfate Nausea Only    Review of Systems: A complete ROS was negative except as per HPI.   OBJECTIVE:   Physical Exam: Blood pressure (!) 185/71, pulse 72, temperature 97.7 F (36.5 C), temperature source Oral, resp. rate (!) 22, height 5\' 5"  (1.651 m), weight 75.8 kg, SpO2 100%.  Physical Exam Constitutional:      Appearance: She is ill-appearing.  HENT:     Head:  Normocephalic and atraumatic.  Cardiovascular:     Rate and Rhythm: Normal rate and regular rhythm.     Heart sounds: Normal heart sounds.  Pulmonary:     Effort: Tachypnea present.     Breath sounds: Normal breath sounds.  Abdominal:     General: Abdomen is flat. Bowel sounds are normal.     Palpations: Abdomen is soft.  Musculoskeletal:     Comments: BLE atrophy   Neurological:     Mental Status: She is lethargic.     Comments: Resistant to eye examination; intermittently would open eyes but largely unresponsive to commands     Labs: CBC    Component Value Date/Time   WBC 5.6 09/14/2023 0143   RBC 5.28 (H) 09/14/2023 0143   HGB 14.6 09/14/2023 0143   HGB 10.8 (L) 03/23/2023 1628   HCT 45.1 09/14/2023 0143   HCT 34.4 03/23/2023 1628   PLT 219 09/14/2023 0143   PLT 315 03/23/2023 1628   MCV 85.4 09/14/2023 0143   MCV 86 03/23/2023 1628   MCH 27.7 09/14/2023 0143   MCHC 32.4 09/14/2023 0143   RDW 15.4 09/14/2023 0143   RDW 12.8 03/23/2023 1628   LYMPHSABS 1.7 08/20/2023 0130   MONOABS 0.6 08/20/2023 0130   EOSABS 0.1 08/20/2023 0130   BASOSABS 0.1 08/20/2023 0130     CMP     Component Value Date/Time   NA 139 09/14/2023 0143   NA 134 03/23/2023 1628   K 3.5 09/14/2023 0143   CL 108 09/14/2023 0143   CO2 16 (L) 09/14/2023 0143   GLUCOSE 124 (H) 09/14/2023 0143   GLUCOSE 124 (H) 07/28/2006 1202   BUN 16 09/14/2023 0143   BUN 52 (H) 03/23/2023 1628   CREATININE 0.65 09/14/2023 0143   CREATININE 0.98 (H) 07/03/2020 0938   CALCIUM 9.7 09/14/2023 0143   PROT 6.7 09/14/2023 0143   ALBUMIN 3.2 (L)  09/14/2023 0143   AST 27 09/14/2023 0143   ALT 23 09/14/2023 0143   ALKPHOS 58 09/14/2023 0143   BILITOT 0.6 09/14/2023 0143   GFRNONAA >60 09/14/2023 0143   GFRNONAA 66 07/01/2020 0927   GFRAA 77 07/01/2020 0927    Imaging: Portable Chest 1 View IMPRESSION: No active disease.  CT Head WO Contrast:  IMPRESSION: Small contusion in the left posterior parietal  lobe near the vertex. Chronic atrophic and ischemic changes are seen.  CT Lumbar Spine WO Contrast:  IMPRESSION: 1. No acute fracture or traumatic listhesis. 2. Severe spinal canal stenosis at L2-L3, L3-L4, and L4-L5 secondary to a combination of the disc bulge and ligamentum flavum hypertrophy. Correlate for symptoms of cauda equina syndrome and consider further evaluation with lumbar spine MRI. 3. Severe bilateral neural foraminal stenosis at L4-L5 and moderate right-sided neural foraminal stenosis at L2-L3 and L3-L4. 4. Markedly distended urinary bladder.  MR Lumbar Spine WO Contrast:  IMPRESSION: 1. Motion degraded exam. Within this limitation, there is severe spinal canal stenosis at L4-L5 and moderate to severe spinal canal stenosis at L2-L3. This is secondary to a combination of degenerative disc bulges and ligamentum flavum hypertrophy. 2. Severe bilateral neural foraminal stenosis at L4-L5. 3. Markedly distended urinary bladder.  EKG: personally reviewed my interpretation is NSR.   ASSESSMENT & PLAN:   Assessment & Plan by Problem: Active Problems:   Acute encephalopathy   Michelle [name] is an 89 YO female on home hospice with a history of CAD, COPD, and subarachnoid hemorrhage who presented to the ED for seizures refractory to benzodiazepines.  #Seizures  Presented with seizures refractory to benzos. UA unremarkable. Unclear etiology. Neuro following who will perform EEG. NSG consulted who do not believe that this is cauda equina syndrome.  No indication for neurosurgical intervention at this time.  Lamotrigine increased to 75 mg in the morning and 100 in the evening.  Will continue goals of care with son's and evaluate for changes in mental status. - Begin lamotrigine 75 mg in the morning and 100 mg in the evening - F/u neuro recs - appreciate assistance  - F/u EEG - F/u lamotrigine levels   #Goals of Care Currently receives home health hospice with her care.  Will  continue to engage goals of care with family depending upon changes in mental status.  #Urinary Retention  Noted to have had distended bladder on CT and unable to urinate on her own so Foley catheter requested.  Diet: NPO VTE: None IVF: None,None Code: DNR  Prior to Admission Living Arrangement: Home, living with son  Anticipated Discharge Location: Home Barriers to Discharge: Seizures, mental status   Dispo: Admit patient to Observation with expected length of stay less than 2 midnights.  Signed: Morrie Sheldon, MD Internal Medicine Resident PGY-1  09/14/2023, 1:34 PM

## 2023-09-14 NOTE — Progress Notes (Signed)
 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 She was brought to the hospital by EMS for seizure activity. [facility] was notified by patient's family. Per Dr. Anne [name] with [facility], this is a related hospital admission.    Visited with patient in ED with son, Michelle Hua at bedside. Patient is without signs of pain or distress noted. She is having an EEG done during my visit. Discussed events leading up to ED visit and plan of care with son.    Patient is GIP appropriate for evaluation and management of seizure activity. Also for management of urinary retention.    Vital Signs: 97.7/70/17   180/71   O2 99% on RA   I&O: none recorded   Abnormal labs:  CO2: 16 (L) Glucose: 124 (H) Albumin: 3.2 (L) RBC: 5.28 (H)   Diagnostics:   MRI Lumbar spine without contrast IMPRESSION: 1. Motion degraded exam. Within this limitation, there is severe spinal canal stenosis at L4-L5 and moderate to severe spinal canal stenosis at L2-L3. This is secondary to a combination of degenerative disc bulges and ligamentum flavum hypertrophy. 2. Severe bilateral neural foraminal stenosis at L4-L5. 3. Markedly distended urinary bladder.  CT Lumbar spine without contrast IMPRESSION: 1. No acute fracture or traumatic listhesis. 2. Severe spinal canal stenosis at L2-L3, L3-L4, and L4-L5 secondary to a combination of the disc bulge and ligamentum flavum hypertrophy. Correlate for symptoms of cauda equina syndrome and consider further evaluation with lumbar spine MRI. 3. Severe bilateral neural foraminal stenosis at L4-L5 and moderate right-sided neural foraminal stenosis at L2-L3 and L3-L4. 4. Markedly distended urinary bladder.  CT Head WO Contrast IMPRESSION: Small contusion in the left posterior parietal lobe near the vertex. Chronic atrophic and  ischemic changes are seen.  DG Chest IMPRESSION: No active disease.   IV/PRN Meds: Versed 2mg  IV x 1, NS 500mg  IV x 1   Assessment/Plan per Michelle Sheldon, MD 2.27/.25:  Active Problems: Acute encephalopathy Michelle [name] is an 89 YO female on home hospice with a history of CAD, COPD, and subarachnoid hemorrhage who presented to the ED for seizures refractory to benzodiazepines.   #Seizures  Presented with seizures refractory to benzos. UA unremarkable. Unclear etiology. Neuro following who will perform EEG. NSG consulted who do not believe that this is cauda equina syndrome.  No indication for neurosurgical intervention at this time.  Lamotrigine increased to 75 mg in the morning and 100 in the evening.  Will continue goals of care with son's and evaluate for changes in mental status. - Begin lamotrigine 75 mg in the morning and 100 mg in the evening - F/u neuro recs - appreciate assistance  - F/u EEG - F/u lamotrigine levels    #Goals of Care Currently receives home health hospice with her care.  Will continue to engage goals of care with family depending upon changes in mental status.   #Urinary Retention  Noted to have had distended bladder on CT and unable to urinate on her own so Foley catheter requested.    Discharge Planning:  Ongoing    Family contact: Spoke with son at bedside.   IDT:  Updated      Goals of Care: DNR   Should patient need ambulance transfer at discharge- please use [facility]) as they contract this service for our active hospice patients.    Michelle [name], BSN, RN, OCN  Hospice Nurse Liaison [phone]

## 2023-09-14 NOTE — ED Notes (Addendum)
 Patient transported to MRI

## 2023-09-14 NOTE — Progress Notes (Signed)
 ED [facility] Hospice liaison note     This patient is a current hospice patient with [facility].    Liaison will continue to follow for any discharge planning needs and to coordinate continuation of hospice care.    Please don't hesitate to call with any Hospice related questions or concerns.    Thank you for the opportunity to participate in this patient's care.  Glenna Fellows, BSN, RN, OCN [facility] Liaison [phone]  (Current hospice medication list)

## 2023-09-14 NOTE — ED Notes (Signed)
 [name] irate that we did not give her something stronger than tylenol or lidocaine patch. He advised his mother had been crying out in pain. I advised him that when I went to give her PO tylenol, she did not, or acted as if she could not respond to me. I then felt it unsafe for her to have pills, so provider ordered lido patch.

## 2023-09-14 NOTE — Procedures (Signed)
 Patient Name: Michelle [name]  MRN: [id number]  Epilepsy Attending: Charlsie Quest  Referring Physician/Provider: Marjorie Smolder, NP  Date: 09/14/2023 Duration: 23.33 mins  Patient history: 89yo female presenting from NH, on hospice, bedbound at baseline after ~73min seizure. EEG to evaluate for seizure  Level of alertness: Awake, asleep  AEDs during EEG study: LTG, versed  Technical aspects: This EEG study was done with scalp electrodes positioned according to the 10-20 International system of electrode placement. Electrical activity was reviewed with band pass filter of 1-70Hz , sensitivity of 7 uV/mm, display speed of 34mm/sec with a 60Hz  notched filter applied as appropriate. EEG data were recorded continuously and digitally stored.  Video monitoring was available and reviewed as appropriate.  Description: The posterior dominant rhythm consists of 8 Hz activity of moderate voltage (25-35 uV) seen predominantly in posterior head regions, symmetric and reactive to eye opening and eye closing. Sleep was characterized by vertex waves, sleep spindles (12 to 14 Hz), maximal frontocentral region. EEG showed intermittent generalized 3 to 6 Hz theta-delta slowing. Spike was noted in right hemisphere, maximal right temporo-parietal region.  Hyperventilation and photic stimulation were not performed.     ABNORMALITY -Spike, right hemisphere, maximal right temporo-parietal region - Intermittent slow, generalized  IMPRESSION: This study showed evidence of epileptogenicity arising from right hemisphere, maximal right temporo-parietal region. Additionally there is mild diffuse encephalopathy. No seizures were seen throughout the recording.  Jiro Kiester Annabelle Harman

## 2023-09-14 NOTE — Progress Notes (Signed)
 Pt has arrived.

## 2023-09-14 NOTE — ED Provider Notes (Signed)
 89 yo female presenting with break through seizure Hx of SAH - CT head no acute bleed today EMS gave 5 mg IM versed, 15 mg diazepam by family at home  Monitoring until back to baseline state - family at bedside reporting improvement - pt on home hospice  Pt on Lamictal - taken off Keppra previously  Physical Exam  BP (!) 185/71   Pulse 72   Temp 97.7 F (36.5 C) (Oral)   Resp (!) 22   Ht 5\' 5"  (1.651 m)   Wt 75.8 kg   SpO2 100%   BMI 27.81 kg/m   Physical Exam  Procedures  Procedures  ED Course / MDM   Clinical Course as of 09/14/23 1205  Thu Sep 14, 2023  0953 CT reviewed - pt has good sensation in lower extremities and moving limbs spontaneously but does not follow commands due to confusion.  We are trying a void trial but she had in and out cath earlier [MT]  1123 Pt remains somnolent, not able to follow motor commands - mumbles - pinprick/pain sensation intact near perineum and good rectal tone, but bladder markedly distended.  Will place foley, speak to NSGY - pt not likely to be a good surgical candidate with her comorbidities and per my discussion with her son. [MT]  1153 Admitted to IM [MT]  1203 Dr Conchita Paris NSGY reviewed the patient's history with me as well as her imaging.  Based on his initial presentation it seems less likely the patient has an acute cauda equina syndrome, as her bladder and urine retention may be multifactorial.  At either case given that the patient has been bedbound and nonambulatory for at least a month, she would not be a safe operative candidate and unlikely to benefit from emergent surgery, given the risks involved.  Plan to admit to the hospital service, they can attempt a void trial when the patient is more alert [MT]  1204 Neurology also consulted regarding possibility of persistent seizures for confusion and potential EEG - consult pending now on admission [MT]    Clinical Course User Index [MT] Michelle [name], Michelle Balo, MD   Medical Decision  Making Amount and/or Complexity of Data Reviewed Labs: ordered. Radiology: ordered. ECG/medicine tests: ordered.  Risk OTC drugs. Prescription drug management. Decision regarding hospitalization.   Per neurologist Dr Iver Nestle -we will send a Lamictal level, but it is reasonable to increase the patient's Lamictal dosing to 100 mg twice daily, or else 75 mg in the morning and 100 at night if this is causing too much drowsiness.  I reviewed the patient's medical list does not appear to be any direct drug drug interactions with her Lamictal.  The plan was discussed with her family was in agreement.  CT imaging of the lumbar spine been ordered as the patient was complaining of low back pain on my reassessment.  The patient is reportedly bedbound according to her family including during her seizure today, did not have any witnessed fall out of bed.  She has not had issues prior of spinal surgery according to the family, but she has complained of back pain which I believe is due to her prolonged immobilization in bed.  I have a lower suspicion for AAA or intra-abdominal emergency as a cause of her pain.  *  Morning Lamictal dose was ordered as well as Tylenol and Motrin for pain.  *  MRI reviewed with L2-L3 level central canal compression.  Difficult to perform full neurological exam due to  the patient's level of somnolence and confusion, she is not able to follow simple commands or accurately replied to sensation testing.  She does immediately localize the painful sensation including around the perineum.  She has normal rectal tone.  She is able to move her lower extremities of her own accord while in bed.  I requested a Foley catheter be placed as the patient has a markedly distended bladder on MRI and has not been able to urinate on her own since arriving in the ED.  Again it is not clear whether this is neurogenic or simply due to her encephalopathy.  In discussing the case with the patient's  son at the bedside we agreed to admit the patient back to the hospital as she is not back to her baseline mental status.  This may be multifactorial encephalopathy.  I do not see any clear evidence of repetitive or rhythmic motion to suggest that this is subclinical seizure.     Terald Sleeper, MD 09/14/23 [phone]

## 2023-09-14 NOTE — ED Triage Notes (Signed)
 Patient bib ems from home after seizure. Family reports approx 5 minutes of seizures. Family gave 15mg  diazepam and ems gave 5 midazolam IM. GCS 7.   BP 188/90 HR 80 CBG 113

## 2023-09-14 NOTE — Discharge Instructions (Signed)
 You were evaluated in the Emergency Department and after careful evaluation, we did not find any emergent condition requiring admission or further testing in the hospital.  Your exam/testing today is overall reassuring.  Symptoms likely due to a seizure.  Recommend continued follow-up with your regular doctors to discuss her symptoms and your seizure management.  Please return to the Emergency Department if you experience any worsening of your condition.   Thank you for allowing [place] to be a part of your care.

## 2023-09-14 NOTE — Progress Notes (Signed)
  NEUROSURGERY PROGRESS NOTE   Called by ED MD regarding this patient presenting from NH, on hospice, bedbound at baseline after ~75min SZ. Pt begain c/o back pain. Per ED, pt unable to follow commands due to advanced dementia but moves BLE spontaneously, responds to perineal sensation, and has normal rectal tone. CT and MRI L-spine completed which demonstrate advanced degenerative disease with associated severe stenosis at L2-3 and L4-5. Given her overall baseline condition she is likely not a candidate for any operative intervention, furthermore her current exam seems inconsistent with cauda equina syndrome. I therefore do not see any role for neurosurgical intervention.  Lisbeth Renshaw, MD [facility]

## 2023-09-14 NOTE — Progress Notes (Signed)
 Pt BP 163/79, MD made aware.

## 2023-09-14 NOTE — Progress Notes (Signed)
 EEG complete - results pending

## 2023-09-15 ENCOUNTER — Other Ambulatory Visit (HOSPITAL_COMMUNITY): Payer: Self-pay

## 2023-09-15 DIAGNOSIS — I10 Essential (primary) hypertension: Secondary | ICD-10-CM

## 2023-09-15 DIAGNOSIS — R569 Unspecified convulsions: Secondary | ICD-10-CM | POA: Diagnosis not present

## 2023-09-15 DIAGNOSIS — L8990 Pressure ulcer of unspecified site, unspecified stage: Secondary | ICD-10-CM | POA: Insufficient documentation

## 2023-09-15 LAB — COMPREHENSIVE METABOLIC PANEL
ALT: 22 U/L (ref 0–44)
AST: 23 U/L (ref 15–41)
Albumin: 3 g/dL — ABNORMAL LOW (ref 3.5–5.0)
Alkaline Phosphatase: 59 U/L (ref 38–126)
Anion gap: 10 (ref 5–15)
BUN: 10 mg/dL (ref 8–23)
CO2: 20 mmol/L — ABNORMAL LOW (ref 22–32)
Calcium: 9.5 mg/dL (ref 8.9–10.3)
Chloride: 110 mmol/L (ref 98–111)
Creatinine, Ser: 0.55 mg/dL (ref 0.44–1.00)
GFR, Estimated: >60 mL/min (ref >60)
Glucose, Bld: 102 mg/dL — ABNORMAL HIGH (ref 70–99)
Potassium: 3.2 mmol/L — ABNORMAL LOW (ref 3.5–5.1)
Sodium: 140 mmol/L (ref 135–145)
Total Bilirubin: 0.6 mg/dL (ref 0.0–1.2)
Total Protein: 6.7 g/dL (ref 6.5–8.1)

## 2023-09-15 LAB — GLUCOSE, CAPILLARY
Glucose-Capillary: 120 mg/dL — ABNORMAL HIGH (ref 70–99)
Glucose-Capillary: 143 mg/dL — ABNORMAL HIGH (ref 70–99)
Glucose-Capillary: 153 mg/dL — ABNORMAL HIGH (ref 70–99)
Glucose-Capillary: 85 mg/dL (ref 70–99)

## 2023-09-15 LAB — CBC
HCT: 40.6 % (ref 36.0–46.0)
Hemoglobin: 13.4 g/dL (ref 12.0–15.0)
MCH: 27.2 pg (ref 26.0–34.0)
MCHC: 33 g/dL (ref 30.0–36.0)
MCV: 82.5 fL (ref 80.0–100.0)
Platelets: 212 10*3/uL (ref 150–400)
RBC: 4.92 MIL/uL (ref 3.87–5.11)
RDW: 15.4 % (ref 11.5–15.5)
WBC: 7.1 10*3/uL (ref 4.0–10.5)
nRBC: 0 % (ref 0.0–0.2)

## 2023-09-15 LAB — MRSA NEXT GEN BY PCR, NASAL: MRSA by PCR Next Gen: NOT DETECTED

## 2023-09-15 LAB — LAMOTRIGINE LEVEL: Lamotrigine Lvl: 3.1 ug/mL (ref 2.0–20.0)

## 2023-09-15 MED ORDER — PANTOPRAZOLE SODIUM 40 MG PO TBEC
40.0000 mg | DELAYED_RELEASE_TABLET | Freq: Every day | ORAL | Status: DC
  Administered 2023-09-15 – 2023-09-16 (×2): 40 mg via ORAL
  Filled 2023-09-15 (×2): qty 1

## 2023-09-15 MED ORDER — ORAL CARE MOUTH RINSE: 15.0000 mL | OROMUCOSAL | Status: DC | PRN

## 2023-09-15 MED ORDER — POTASSIUM CHLORIDE 20 MEQ PO PACK
40.0000 meq | PACK | Freq: Once | ORAL | Status: AC
  Administered 2023-09-15: 40 meq via ORAL
  Filled 2023-09-15: qty 2

## 2023-09-15 MED ORDER — SERTRALINE HCL 100 MG PO TABS
100.0000 mg | ORAL_TABLET | Freq: Every day | ORAL | Status: DC
  Administered 2023-09-15: 100 mg via ORAL
  Filled 2023-09-15: qty 1

## 2023-09-15 MED ORDER — INSULIN ASPART 100 UNIT/ML IJ SOLN
0.0000 [IU] | INTRAMUSCULAR | Status: DC
  Administered 2023-09-15: 2 [IU] via SUBCUTANEOUS

## 2023-09-15 MED ORDER — OXYCODONE HCL 5 MG PO TABS
5.0000 mg | ORAL_TABLET | Freq: Four times a day (QID) | ORAL | 0 refills | Status: DC | PRN
  Filled 2023-09-15: qty 20, 5d supply, fill #0

## 2023-09-15 MED ORDER — PANTOPRAZOLE SODIUM 20 MG PO TBEC: 20.0000 mg | DELAYED_RELEASE_TABLET | Freq: Every day | ORAL | Status: DC

## 2023-09-15 MED ORDER — ORAL CARE MOUTH RINSE
15.0000 mL | OROMUCOSAL | Status: DC
  Administered 2023-09-15 – 2023-09-16 (×6): 15 mL via OROMUCOSAL

## 2023-09-15 MED ORDER — ENALAPRIL MALEATE 10 MG PO TABS
20.0000 mg | ORAL_TABLET | Freq: Every day | ORAL | Status: DC
Start: 2023-09-15 — End: 2023-09-16
  Administered 2023-09-15 – 2023-09-16 (×2): 20 mg via ORAL
  Filled 2023-09-15 (×2): qty 2

## 2023-09-15 MED ORDER — CARVEDILOL 12.5 MG PO TABS
12.5000 mg | ORAL_TABLET | Freq: Two times a day (BID) | ORAL | Status: DC
Start: 2023-09-15 — End: 2023-09-16
  Administered 2023-09-15 – 2023-09-16 (×3): 12.5 mg via ORAL
  Filled 2023-09-15 (×2): qty 1
  Filled 2023-09-15: qty 2

## 2023-09-15 MED ORDER — LAMOTRIGINE 100 MG PO TABS
100.0000 mg | ORAL_TABLET | Freq: Every day | ORAL | Status: DC
  Administered 2023-09-15: 100 mg via ORAL
  Filled 2023-09-15: qty 1

## 2023-09-15 MED ORDER — INSULIN ASPART 100 UNIT/ML IJ SOLN
0.0000 [IU] | Freq: Three times a day (TID) | INTRAMUSCULAR | Status: DC
  Administered 2023-09-15 – 2023-09-16 (×2): 3 [IU] via SUBCUTANEOUS

## 2023-09-15 MED ORDER — MEDROXYPROGESTERONE ACETATE 10 MG PO TABS
10.0000 mg | ORAL_TABLET | Freq: Every day | ORAL | Status: DC
Start: 2023-09-15 — End: 2023-09-16
  Administered 2023-09-15: 10 mg via ORAL
  Filled 2023-09-15 (×2): qty 1

## 2023-09-15 MED ORDER — NORTRIPTYLINE HCL 10 MG PO CAPS
10.0000 mg | ORAL_CAPSULE | Freq: Every day | ORAL | Status: DC
  Filled 2023-09-15: qty 1

## 2023-09-15 MED ORDER — ASPIRIN 81 MG PO CHEW
81.0000 mg | CHEWABLE_TABLET | Freq: Every day | ORAL | Status: DC
Start: 2023-09-15 — End: 2023-09-16
  Administered 2023-09-15 – 2023-09-16 (×2): 81 mg via ORAL
  Filled 2023-09-15 (×2): qty 1

## 2023-09-15 MED ORDER — LACTATED RINGERS IV SOLN: INTRAVENOUS | Status: AC

## 2023-09-15 MED ORDER — SODIUM CHLORIDE 0.9 % IV SOLN
75.0000 mg | Freq: Two times a day (BID) | INTRAVENOUS | Status: AC
  Administered 2023-09-15: 75 mg via INTRAVENOUS
  Filled 2023-09-15 (×2): qty 7.5

## 2023-09-15 MED ORDER — LAMOTRIGINE 25 MG PO TABS
75.0000 mg | ORAL_TABLET | Freq: Every morning | ORAL | Status: DC
  Administered 2023-09-16: 75 mg via ORAL
  Filled 2023-09-15: qty 3

## 2023-09-15 MED ORDER — LORATADINE 10 MG PO TABS
10.0000 mg | ORAL_TABLET | ORAL | Status: DC
  Administered 2023-09-15: 10 mg via ORAL
  Filled 2023-09-15: qty 1

## 2023-09-15 MED ORDER — LAMOTRIGINE 25 MG PO TABS
ORAL_TABLET | ORAL | 0 refills | Status: DC
  Filled 2023-09-15: qty 210, 30d supply, fill #0
  Filled 2023-09-15: qty 210, fill #0

## 2023-09-15 MED ORDER — ICOSAPENT ETHYL 1 G PO CAPS
2.0000 g | ORAL_CAPSULE | Freq: Two times a day (BID) | ORAL | Status: DC
  Administered 2023-09-15: 2 g via ORAL
  Filled 2023-09-15 (×4): qty 2

## 2023-09-15 MED ORDER — OXYCODONE HCL 5 MG PO TABS
5.0000 mg | ORAL_TABLET | Freq: Two times a day (BID) | ORAL | Status: DC | PRN
  Administered 2023-09-15: 5 mg via ORAL
  Filled 2023-09-15: qty 1

## 2023-09-15 MED ORDER — QUETIAPINE FUMARATE 25 MG PO TABS
25.0000 mg | ORAL_TABLET | Freq: Every evening | ORAL | Status: DC
  Administered 2023-09-15: 25 mg via ORAL
  Filled 2023-09-15: qty 1

## 2023-09-15 MED ORDER — ACETAMINOPHEN 500 MG PO TABS
1000.0000 mg | ORAL_TABLET | Freq: Four times a day (QID) | ORAL | Status: DC | PRN
  Administered 2023-09-15 – 2023-09-16 (×2): 1000 mg via ORAL
  Filled 2023-09-15 (×2): qty 2

## 2023-09-15 MED ORDER — AMLODIPINE BESYLATE 2.5 MG PO TABS
2.5000 mg | ORAL_TABLET | Freq: Every day | ORAL | Status: DC
  Administered 2023-09-15 – 2023-09-16 (×2): 2.5 mg via ORAL
  Filled 2023-09-15 (×2): qty 1

## 2023-09-15 MED ORDER — VITAMIN E 45 MG (100 UNIT) PO CAPS
400.0000 [IU] | ORAL_CAPSULE | Freq: Every day | ORAL | Status: DC
  Administered 2023-09-15 – 2023-09-16 (×2): 400 [IU] via ORAL
  Filled 2023-09-15 (×2): qty 4

## 2023-09-15 MED ORDER — VITAMIN D 25 MCG (1000 UNIT) PO TABS
1000.0000 [IU] | ORAL_TABLET | Freq: Every day | ORAL | Status: DC
  Administered 2023-09-15 – 2023-09-16 (×2): 1000 [IU] via ORAL
  Filled 2023-09-15 (×2): qty 1

## 2023-09-15 NOTE — Progress Notes (Incomplete)
Internal Medicine Attending:  I personally saw and examined the patient. Discussed with the resident and agree with the resident's findings and plan of care as documented in the resident's note. Holding lamotrigine until swallowing eval.  Vimpat for now. Appreciate Neuro f/u.  Work on BP control.  Ginnie Smart, MD

## 2023-09-15 NOTE — Discharge Summary (Incomplete)
Name: Michelle [name] MRN: [id number] DOB: [birth year] 89 y.o. PCP: Ginette Otto, Hospice At  Date of Admission: 09/14/2023  1:21 AM Date of Discharge:  09/15/23 Attending Physician: Dr.  Ninetta Lights   DISCHARGE DIAGNOSIS:  Primary Problem: Acute Encephalopathy   Hospital Problems: Active Problems:   Acute encephalopathy   Mild protein-calorie malnutrition (HCC)   Spinal stenosis of lumbar region    DISCHARGE MEDICATIONS:   Allergies as of 09/15/2023       Reactions   Codeine Sulfate Nausea Only   Haldol [haloperidol] Anxiety   Morphine Sulfate Nausea Only     Med Rec must be completed prior to using this SMARTLINK***       DISPOSITION AND FOLLOW-UP:  Ms.Tea A Staub was discharged from [facility] in Stable condition. At the hospital follow up visit please address:  Follow-up Recommendations: Consults: None Labs:  Lamotrigine level  Studies: None Medications: Lamotrigine    HOSPITAL COURSE:  Patient Summary: #Seizures #Acute encephalopathy Presented with seizures refractory to intranasal benzos at home.  UA was unremarkable.  Neurosurgery consulted for severe stenosis of L2-L3, L3-L4, and L4-L5.  They believed this was not concerning for cauda equina syndrome and would not require neurosurgical intervention.  There was concern for urinary retention so she was given a Foley catheter that was removed prior to discharge.  She was also seen by neurology with concerns for refractory seizures.  As she could not tolerate p.o. intake, she was given Vimpat.  EEG was unremarkable.  The morning prior to discharge, the patient was back at her baseline.  She continued to endorse back pain so was given short dose of oxycodone.  #Goals of care Currently receives home health hospice.  Will be discharged back home.  #Urinary retention Noted to have had distended bladder on CT. Foley catheter initially required but removed prior to discharge.    DISCHARGE  INSTRUCTIONS:    SUBJECTIVE:  Patient was evaluated bedside.  She endorses back pain with no radiation in her legs.  Denies any chest pain, nausea, vomiting, or any other signs or symptoms. Discharge Vitals:   BP (!) 171/77 (BP Location: Left Arm) Comment: MD aware, no new orders  Pulse 92   Temp 98.5 F (36.9 C) (Oral)   Resp 16   Ht 5\' 5"  (1.651 m)   Wt 75.8 kg   SpO2 96%   BMI 27.81 kg/m   OBJECTIVE:  Physical Exam Constitutional:      Appearance: Normal appearance.  HENT:     Head: Normocephalic and atraumatic.  Cardiovascular:     Rate and Rhythm: Normal rate and regular rhythm.  Pulmonary:     Effort: Pulmonary effort is normal.     Breath sounds: Normal breath sounds.  Abdominal:     General: Abdomen is flat. Bowel sounds are normal.     Palpations: Abdomen is soft.  Neurological:     Mental Status: She is alert.     Pertinent Labs, Studies, and Procedures:     Latest Ref Rng & Units 09/15/2023    7:18 AM 09/14/2023    1:43 AM 08/20/2023    2:08 AM  CBC  WBC 4.0 - 10.5 K/uL 7.1  5.6    Hemoglobin 12.0 - 15.0 g/dL 40.9  81.1  91.4   Hematocrit 36.0 - 46.0 % 40.6  45.1  39.0   Platelets 150 - 400 K/uL 212  219         Latest Ref Rng & Units  09/14/2023    1:43 AM 08/20/2023    2:08 AM 08/20/2023    1:30 AM  CMP  Glucose 70 - 99 mg/dL 829  562  130   BUN 8 - 23 mg/dL 16  20  20    Creatinine 0.44 - 1.00 mg/dL 8.65  7.84  6.96   Sodium 135 - 145 mmol/L 139  137  135   Potassium 3.5 - 5.1 mmol/L 3.5  3.9  4.0   Chloride 98 - 111 mmol/L 108  106  104   CO2 22 - 32 mmol/L 16   21   Calcium 8.9 - 10.3 mg/dL 9.7   9.3   Total Protein 6.5 - 8.1 g/dL 6.7   6.7   Total Bilirubin 0.0 - 1.2 mg/dL 0.6   0.5   Alkaline Phos 38 - 126 U/L 58   41   AST 15 - 41 U/L 27   23   ALT 0 - 44 U/L 23   15     EEG adult Result Date: 09/14/2023 Charlsie Quest, MD     09/14/2023  3:17 PM Patient Name: Michelle [name] MRN: [id number] Epilepsy Attending: Charlsie Quest Referring  Physician/Provider: Marjorie Smolder, NP Date: 09/14/2023 Duration: 23.33 mins Patient history: 89yo female presenting from NH, on hospice, bedbound at baseline after ~24min seizure. EEG to evaluate for seizure Level of alertness: Awake, asleep AEDs during EEG study: LTG, versed Technical aspects: This EEG study was done with scalp electrodes positioned according to the 10-20 International system of electrode placement. Electrical activity was reviewed with band pass filter of 1-70Hz , sensitivity of 7 uV/mm, display speed of 13mm/sec with a 60Hz  notched filter applied as appropriate. EEG data were recorded continuously and digitally stored.  Video monitoring was available and reviewed as appropriate. Description: The posterior dominant rhythm consists of 8 Hz activity of moderate voltage (25-35 uV) seen predominantly in posterior head regions, symmetric and reactive to eye opening and eye closing. Sleep was characterized by vertex waves, sleep spindles (12 to 14 Hz), maximal frontocentral region. EEG showed intermittent generalized 3 to 6 Hz theta-delta slowing. Spike was noted in right hemisphere, maximal right temporo-parietal region.  Hyperventilation and photic stimulation were not performed.   ABNORMALITY -Spike, right hemisphere, maximal right temporo-parietal region - Intermittent slow, generalized IMPRESSION: This study showed evidence of epileptogenicity arising from right hemisphere, maximal right temporo-parietal region. Additionally there is mild diffuse encephalopathy. No seizures were seen throughout the recording. Charlsie Quest   MR LUMBAR SPINE WO CONTRAST Result Date: 09/14/2023 CLINICAL DATA:  Low back pain, cauda equina syndrome suspected EXAM: MRI LUMBAR SPINE WITHOUT CONTRAST TECHNIQUE: Multiplanar, multisequence MR imaging of the lumbar spine was performed. No intravenous contrast was administered. COMPARISON:  Same day lumbar spine CT FINDINGS: Segmentation: As reported previously  there are small rib lets at T12. Alignment:  Grade 1 anterolisthesis L4 on L5 and L5 on S1 Vertebrae:  No fracture, evidence of discitis, or bone lesion. Conus medullaris and cauda equina: Conus extends to the L1-L2 disc space level. Conus and cauda equina appear normal. Paraspinal and other soft tissues: Markedly distended urinary bladder. Disc levels: Limitations: Motion degraded exam T12-L1: Mild bilateral facet degenerative change. No significant disc bulge. No spinal canal narrowing. Mild right neural foraminal narrowing. L1-L2: Mild bilateral facet degenerative change with fluid in the facet joints. Minimal disc bulge. Mild spinal canal narrowing. Moderate bilateral neural foraminal narrowing. L2-L3: Moderate left and mild right facet degenerative change.  There is a obliteration of the right lateral recess. Moderate severe spinal canal stenosis. Moderate left and moderate to severe right neural foraminal narrowing. L3-L4: Moderate bilateral facet degenerative change. Circumferential disc bulge. Moderate spinal canal narrowing. There is narrowing of the bilateral lateral recesses. Moderate bilateral neural foraminal narrowing. L4-L5: Severe left and moderate right facet degenerative change. Circumferential disc bulge. Severe spinal canal stenosis. Severe bilateral neural foraminal stenosis. L5-S1: Moderate bilateral facet degenerative change. Eccentric right disc bulge. Mild overall spinal canal narrowing. Mild bilateral neural foraminal narrowing. IMPRESSION: 1. Motion degraded exam. Within this limitation, there is severe spinal canal stenosis at L4-L5 and moderate to severe spinal canal stenosis at L2-L3. This is secondary to a combination of degenerative disc bulges and ligamentum flavum hypertrophy. 2. Severe bilateral neural foraminal stenosis at L4-L5. 3. Markedly distended urinary bladder. Electronically Signed   By: Lorenza Cambridge M.D.   On: 09/14/2023 11:12   CT Lumbar Spine Wo Contrast Result Date:  09/14/2023 CLINICAL DATA:  Low back pain, increased fracture risk EXAM: CT LUMBAR SPINE WITHOUT CONTRAST TECHNIQUE: Multidetector CT imaging of the lumbar spine was performed without intravenous contrast administration. Multiplanar CT image reconstructions were also generated. RADIATION DOSE REDUCTION: This exam was performed according to the departmental dose-optimization program which includes automated exposure control, adjustment of the mA and/or kV according to patient size and/or use of iterative reconstruction technique. COMPARISON:  None Available. FINDINGS: Segmentation: 5 lumbar type vertebrae. There are small rib lets at T12. Alignment: Mild retrolisthesis of L2 on L3. Grade 1 anterolisthesis of L3 on L4 and L4 on L5. Vertebrae: No acute fracture or focal pathologic process. Degenerative endplate changes of the inferior and superior endplates of L4 on L5 and L2 on L3. There is a small Schmorl's node at L2. Vacuum disc phenomenon at the L2-L3, L3-L4, and L4-L5 disc spaces. Paraspinal and other soft tissues: Aortic atherosclerotic calcifications. Marked distention of the urinary bladder. Disc levels: Spinal canal stenosis: Severe spinal canal stenosis at L2-L3, L3-L4, L4-L5 secondary to a combination of the disc bulge and ligamentum flavum hypertrophy Neuroforaminal stenosis: Severe bilateral neural foraminal stenosis at L4-L5 and moderate right-sided neural foraminal stenosis at L2-L3 and L3-L4. IMPRESSION: 1. No acute fracture or traumatic listhesis. 2. Severe spinal canal stenosis at L2-L3, L3-L4, and L4-L5 secondary to a combination of the disc bulge and ligamentum flavum hypertrophy. Correlate for symptoms of cauda equina syndrome and consider further evaluation with lumbar spine MRI. 3. Severe bilateral neural foraminal stenosis at L4-L5 and moderate right-sided neural foraminal stenosis at L2-L3 and L3-L4. 4. Markedly distended urinary bladder. Aortic Atherosclerosis (ICD10-I70.0). Electronically  Signed   By: Lorenza Cambridge M.D.   On: 09/14/2023 09:09   CT HEAD WO CONTRAST ( ) Result Date: 09/14/2023 CLINICAL DATA:  Recent seizure active EXAM: CT HEAD WITHOUT CONTRAST TECHNIQUE: Contiguous axial images were obtained from the base of the skull through the vertex without intravenous contrast. RADIATION DOSE REDUCTION: This exam was performed according to the departmental dose-optimization program which includes automated exposure control, adjustment of the mA and/or kV according to patient size and/or use of iterative reconstruction technique. COMPARISON:  08/20/2023 FINDINGS: Brain: Diffuse atrophic changes are identified. Minimal contusion is noted high in the left parietal lobe near the vertex. This measures approximately 5 mm in dimension. No acute infarct or space-occupying mass lesion is seen. Matter ischemic changes are noted. Vascular: No hyperdense vessel or unexpected calcification. Skull: Normal. Negative for fracture or focal lesion. Sinuses/Orbits: No acute finding. Other: Traumatic Brain  Injury Risk Stratification Skull Fracture: No - Low/mBIG 1 Subdural Hematoma (SDH): No - Low Subarachnoid Hemorrhage [facility]): No Epidural Hematoma (EDH): No - Low/mBIG 1 Cerebral contusion, intra-axial, intraparenchymal Hemorrhage (IPH): solitary, 4 to <67mm - Moderate/mBIG 2 Intraventricular Hemorrhage (IVH): No - Low/mBIG 1 Midline Shift > 1mm or Edema/effacement of sulci/vents: No - Low/mBIG 1 ---------------------------------------------------- IMPRESSION: Small contusion in the left posterior parietal lobe near the vertex. Chronic atrophic and ischemic changes are seen. Electronically Signed   By: Alcide Clever M.D.   On: 09/14/2023 02:15   DG Chest Port 1 View Result Date: 09/14/2023 CLINICAL DATA:  Seizure EXAM: PORTABLE CHEST 1 VIEW COMPARISON:  08/20/2023 FINDINGS: Heart and mediastinal contours are within normal limits. No focal opacities or effusions. No acute bony abnormality. Aortic  atherosclerosis. IMPRESSION: No active disease. Electronically Signed   By: Charlett Nose M.D.   On: 09/14/2023 02:00     Signed: Morrie Sheldon, MD Internal Medicine Resident, PGY-1 [facility]  Pager: [phone]

## 2023-09-15 NOTE — TOC Initial Note (Signed)
 Transition of Care [facility]) - Initial/Assessment Note    Patient Details  Name: Michelle [name] MRN: [id number] Date of Birth: 24-May-1933  Transition of [facility]) CM/SW Contact:    Kermit Balo, RN Phone Number: 09/15/2023, 1:50 PM  Clinical Narrative:                  Pt is from home with her son and hospice care through [facility]. [facility] is aware of her admission. Pt has needed DME in the home. Pt will need PTAR transport home when medically ready.  TOC following.  Expected Discharge Plan: Home w Hospice Care Barriers to Discharge: Continued Medical Work up   Patient Goals and CMS Choice   CMS Medicare.gov Compare Post Acute Care list provided to:: Patient Represenative (must comment) Choice offered to / list presented to : Adult Children      Expected Discharge Plan and Services   Discharge Planning Services: CM Consult Post Acute Care Choice: Hospice Living arrangements for the past 2 months: Single Family Home                                      Prior Living Arrangements/Services Living arrangements for the past 2 months: Single Family Home Lives with:: Adult Children Patient language and need for interpreter reviewed:: Yes Do you feel safe going back to the place where you live?: Yes        Care giver support system in place?: Yes (comment)   Criminal Activity/Legal Involvement Pertinent to Current Situation/Hospitalization: No - Comment as needed  Activities of Daily Living   ADL Screening (condition at time of admission) Independently performs ADLs?: No Does the patient have a NEW difficulty with bathing/dressing/toileting/self-feeding that is expected to last >3 days?: No (needs assist) Does the patient have a NEW difficulty with getting in/out of bed, walking, or climbing stairs that is expected to last >3 days?: No (bedbound) Does the patient have a NEW difficulty with communication that is expected to last >3 days?: No Is the patient  deaf or have difficulty hearing?: No Does the patient have difficulty seeing, even when wearing glasses/contacts?: No Does the patient have difficulty concentrating, remembering, or making decisions?: Yes (dementia)  Permission Sought/Granted                  Emotional Assessment Appearance:: Appears stated age         Psych Involvement: No (comment)  Admission diagnosis:  Seizure (HCC) [R56.9] Acute encephalopathy [G93.40] Post-ictal state (HCC) [R56.9] Patient Active Problem List   Diagnosis Date Noted   Acute encephalopathy 09/14/2023   Mild protein-calorie malnutrition (HCC) 09/14/2023   Spinal stenosis of lumbar region 09/14/2023   Seizures (HCC) 07/17/2023   Subarachnoid hemorrhage (HCC) 07/05/2023   Seizure (HCC) 07/05/2023   Overweight 06/16/2023   Hyperkalemia 06/14/2023   Breakthrough seizure (HCC) 05/17/2023   Wheelchair dependence 05/17/2023   DNR (do not resuscitate)/DNI(Do Not Intubate) 05/17/2023   Memory impairment 09/09/2022   Chronic pain of both knees 04/05/2022   Epilepsy (HCC) 03/12/2021   Adnexal mass 11/24/2020   Thickened endometrium 11/24/2020   Dementia (HCC) 10/22/2020   Hypokalemia 08/19/2020   Actinic keratosis 11/17/2017   Anemia, iron deficiency 10/23/2015   CKD (chronic kidney disease), stage III (HCC) 07/25/2014   Osteoarthritis, knee 05/13/2014   Anxiety state 04/21/2014   Chronic diastolic CHF (congestive heart failure) (HCC) 03/13/2014   SOB (  shortness of breath) 03/05/2014   CAD- RCA PCI '90s, STEMI-RCA DES 02/18/14 03/05/2014   Back pain, lumbosacral 10/10/2012   Type 2 diabetes mellitus without complication, with long-term current use of insulin (HCC) 03/26/2007   Hyperlipidemia associated with type 2 diabetes mellitus (HCC) 03/26/2007   Essential hypertension 04/21/2006   COPD (chronic obstructive pulmonary disease) (HCC) 04/21/2006   PCP:  Ginette Otto, Hospice At Pharmacy:   [facility] [id number] - [address]  [place] - [address] [facility] [address] [facility] [address] [place] [address] Phone: [phone] Fax: [phone]  [facility] - [place], [place] - [address] [place] [address] Phone: [phone] Fax: [phone]  [place] [facility] [address] [place] [address] Phone: [phone] Fax: [phone]  [facility] [address] [place] [address] Phone: [phone] Fax: [phone]     Social Drivers of Health (SDOH) Social History: SDOH Screenings   Food Insecurity: Patient Unable To Answer (09/14/2023)  Housing: Patient Unable To Answer (09/14/2023)  Transportation Needs: Patient Unable To Answer (09/14/2023)  Utilities: Patient Unable To Answer (09/14/2023)  Depression (PHQ2-9): Medium Risk (06/26/2023)  Financial Resource Strain: Low Risk  (06/26/2023)  Physical Activity: Insufficiently Active (06/26/2023)  Social Connections: Unknown (09/14/2023)  Recent Concern: Social Connections - Socially Isolated (07/17/2023)  Stress: No Stress Concern Present (06/26/2023)  Tobacco Use: Medium Risk (09/14/2023)  Health Literacy: Adequate Health Literacy (06/26/2023)   SDOH Interventions:     Readmission Risk Interventions     No data to display

## 2023-09-15 NOTE — Progress Notes (Signed)
 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 She was brought to the hospital by EMS for seizure activity. [facility] was notified by patient's family. Per Dr. Anne Fu with [facility], this is a related hospital admission.   At bedside [name] is awake but with eyes closed and does not respond. She is fidgeting with the wires going to her cardiac monitor but in no apparent distress. No family at bedside presently. She is currently still receiving IV intervention for seizures.   Patient remains inpatient appropriate with IV medications.  Vital Signs: 98.4/96/17   168/91    spO2 95% room air I&O: 78/400 Abnormal labs: K+ 3.2, Albumin 3  Diagnostics:  None new IV/PRN Meds: Vimpat 75mg  q12H  Problem List as per H&P Dr. Morrie Sheldon 2.27.25 #Seizures  Presented with seizures refractory to benzos. UA unremarkable. Unclear etiology. Neuro following who will perform EEG. NSG consulted who do not believe that this is cauda equina syndrome.  No indication for neurosurgical intervention at this time.  Lamotrigine increased to 75 mg in the morning and 100 in the evening.  Will continue goals of care with son's and evaluate for changes in mental status. - Begin lamotrigine 75 mg in the morning and 100 mg in the evening - F/u neuro recs - appreciate assistance  - F/u EEG - F/u lamotrigine levels    Discharge Planning: Ongoing, likely back home once acute medical treatment is complete Family Contact: left message for son IDT: Updated Goals of Care: DNR Thea Gist, BSN RN Hospice hospital liaison [phone]

## 2023-09-15 NOTE — Hospital Course (Addendum)
 Break-though Seizures Presented with seizures refractory to intranasal benzos at home.  CT head showed small contusion of the left posterior parietal lobe near the vertex.  Spot EEG showed no active seizures.  Neurology was consulted and recommended increasing Lamictal dosing to 75 mg in the morning and 100 mg in the evening since she has continued to have breakthrough seizures.  Severe stenosis of L2-L5 She endorsed back pain following seizures.  MRI showed severe stenosis of L2-L5.  Neurosurgery was consulted and did not think that exam was consistent with cauda equina syndrome.  They also thought that with her current quality of life that any surgical interventions the risk would outweigh the benefits.  Was discharged with oxycodone and will continue to follow with hospice at home.  Urinary retention MRI completed in setting of back pain.  MRI noted marked bladder distention and Foley catheter was placed.  She did not pass a voiding trial and Foley was replaced prior to discharge.  Unclear if urinary retention is from neurologic findings with severe stenosis on MRI versus medications.  Initial concern was encephalopathy from seizures and benzos were contributing but this did not improve after she ripped turned to baseline.  Goals of care Currently receives home health hospice.  Will be discharged back home.

## 2023-09-15 NOTE — Progress Notes (Addendum)
 HD#1 Subjective:  Patient Summary: Michelle [name] is a 89 y.o. with a pertinent PMH of seizure disorder and subarachnoid hemorrhage who follows with hospice, who presented with break through seizure that did not resolve with intranasal benzos at home and admitted on 2/27 for seizure, found to have urinary retention and severe lumbar stenosis on HD#1.   Overnight Events: none  Patient seen at bedside this AM. She is alert and able to state name but is confused on events from yesterday. She continues to have pain in her lower back that is new.   Pt is updated on the plan for today, and all questions and concerns are addressed.   Objective:  Vital signs in last 24 hours: Vitals:   09/15/23 0527 09/15/23 0531 09/15/23 0745 09/15/23 1106  BP: (!) 172/75  (!) 171/77 (!) 168/91  Pulse:   92 92  Resp:  16 16 17   Temp:   98.5 F (36.9 C) 98.4 F (36.9 C)  TempSrc:   Oral Oral  SpO2:   96% 95%  Weight:      Height:       Supplemental O2: Room Air SpO2: 95 %   Physical Exam:  Constitutional: chronically ill appearing, in no acute distress Cardiovascular: regular rate and rhythm, no m/r/g Pulmonary/Chest: normal work of breathing on room air, lungs clear to auscultation bilaterally Abdominal: soft, non-tender, non-distended MSK: no lower extremity edema Neurological: alert, oriented to self only Skin: warm and dry  Filed Weights   09/14/23 0126  Weight: 75.8 kg     Intake/Output Summary (Last 24 hours) at 09/15/2023 1526 Last data filed at 09/15/2023 0800 Gross per 24 hour  Intake 78 ml  Output 400 ml  Net -322 ml   Net IO Since Admission: -322 mL [09/15/23 1526]  Pertinent Labs:    Latest Ref Rng & Units 09/15/2023    7:18 AM 09/14/2023    1:43 AM 08/20/2023    2:08 AM  CBC  WBC 4.0 - 10.5 K/uL 7.1  5.6    Hemoglobin 12.0 - 15.0 g/dL 16.1  09.6  04.5   Hematocrit 36.0 - 46.0 % 40.6  45.1  39.0   Platelets 150 - 400 K/uL 212  219         Latest Ref Rng & Units  09/15/2023    7:18 AM 09/14/2023    1:43 AM 08/20/2023    2:08 AM  CMP  Glucose 70 - 99 mg/dL 409  811  914   BUN 8 - 23 mg/dL 10  16  20    Creatinine 0.44 - 1.00 mg/dL 7.82  9.56  2.13   Sodium 135 - 145 mmol/L 140  139  137   Potassium 3.5 - 5.1 mmol/L 3.2  3.5  3.9   Chloride 98 - 111 mmol/L 110  108  106   CO2 22 - 32 mmol/L 20  16    Calcium 8.9 - 10.3 mg/dL 9.5  9.7    Total Protein 6.5 - 8.1 g/dL 6.7  6.7    Total Bilirubin 0.0 - 1.2 mg/dL 0.6  0.6    Alkaline Phos 38 - 126 U/L 59  58    AST 15 - 41 U/L 23  27    ALT 0 - 44 U/L 22  23      Assessment/Plan:   Active Problems:   Hypertension   Acute encephalopathy   Mild protein-calorie malnutrition (HCC)   Spinal stenosis of lumbar region  Pressure injury of skin   Patient Summary: Michelle [name] is a 89 y.o. with a pertinent PMH of seizure disorder and subarachnoid hemorrhage who follows with hospice, who presented with break through seizure that did not resolve with intranasal benzos at home and admitted on 2/27 for seizure, found to have urinary retention and severe lumbar stenosis on HD#1.   Breakthrough seizure Seizures disorder Spot EEG without active seizure yesterday. She is close to her baseline today and able to take pills. Neuro saw her 2/27 and recommended increasing lamictal from 75 mg BID to 75 mg at breakfast and 100 mg at bedtime.  -vimpat switch to PO Lamictal 75 mg in AM and 100 mg at bedtime -restart diet -Meds sent to [facility] this afternoon to be ready tomorrow AM  Severe stenosis of L2-3 and L4-5 Urinary retention Foley catheter placed in ED in setting of urinary retention. With new back pain, MRI completed that showed severe stenosis. Neurosurg did not think exam consistent with cauda equina syndrome. Foley was removed this morning. Bladder scan at 3:30 pm with 160cc of urine but she has not had urine output since prior to foley removal. Family concerned for pain, will give pain meds prn but may make  retention worse.  -continue bladder scan q 4 hours -giving IV fluids today -oxycodone 5 mg PRN -hold home TCA and loratidine as increase risk of retention  Goals of care Dementia She knows who she is and her family at baseline. Is bedbound. She will be discharged with hospice once medical ready after voiding trial.  -delirium precautions -discontinue telemetry  Chronic stable conditions: HTN- continue amlodipine, coreg, and enalapril GERD- pantoprazole 40 mg Diabetes- SSI here HLD- vascepa continued  Diet: Normal IVF: None,100cc/hr VTE: SCDs Code: DNR/DNI PT/OT recs: None, none. TOC recs: home hospice Family Update:sons updated at bedside   Dispo: Anticipated discharge to Home in tomorrow pending foley trial.   Memory Dance. Kashtyn Jankowski, D.O.  Internal Medicine Resident, PGY-3 [facility]  Pager: [phone] 3:26 PM, 09/15/2023   **Please contact the on call pager after 5 pm and on weekends at [phone].**

## 2023-09-16 ENCOUNTER — Other Ambulatory Visit (HOSPITAL_COMMUNITY): Payer: Self-pay

## 2023-09-16 DIAGNOSIS — I10 Essential (primary) hypertension: Secondary | ICD-10-CM | POA: Diagnosis not present

## 2023-09-16 DIAGNOSIS — M48061 Spinal stenosis, lumbar region without neurogenic claudication: Secondary | ICD-10-CM | POA: Diagnosis not present

## 2023-09-16 DIAGNOSIS — E440 Moderate protein-calorie malnutrition: Secondary | ICD-10-CM | POA: Diagnosis not present

## 2023-09-16 DIAGNOSIS — G9340 Encephalopathy, unspecified: Secondary | ICD-10-CM | POA: Diagnosis not present

## 2023-09-16 LAB — BASIC METABOLIC PANEL WITH GFR
Anion gap: 9 (ref 5–15)
BUN: 8 mg/dL (ref 8–23)
CO2: 22 mmol/L (ref 22–32)
Calcium: 9.4 mg/dL (ref 8.9–10.3)
Chloride: 107 mmol/L (ref 98–111)
Creatinine, Ser: 0.51 mg/dL (ref 0.44–1.00)
GFR, Estimated: >60 mL/min (ref >60)
Glucose, Bld: 122 mg/dL — ABNORMAL HIGH (ref 70–99)
Potassium: 3.4 mmol/L — ABNORMAL LOW (ref 3.5–5.1)
Sodium: 138 mmol/L (ref 135–145)

## 2023-09-16 LAB — GLUCOSE, CAPILLARY
Glucose-Capillary: 120 mg/dL — ABNORMAL HIGH (ref 70–99)
Glucose-Capillary: 170 mg/dL — ABNORMAL HIGH (ref 70–99)

## 2023-09-16 MED ORDER — POTASSIUM CHLORIDE 20 MEQ PO PACK
20.0000 meq | PACK | Freq: Once | ORAL | Status: AC
  Administered 2023-09-16: 20 meq via ORAL
  Filled 2023-09-16: qty 1

## 2023-09-16 MED ORDER — ACETAMINOPHEN 500 MG PO TABS
1000.0000 mg | ORAL_TABLET | Freq: Four times a day (QID) | ORAL | 0 refills | Status: DC | PRN
  Filled 2023-09-16: qty 30, 4d supply, fill #0

## 2023-09-16 NOTE — Progress Notes (Signed)
 [facility] arrived to transport patient home. Patient discharging with foley catheter in place.

## 2023-09-16 NOTE — Progress Notes (Signed)
 [facility] called.  Paperwork on front of chart along with signed DNR.

## 2023-09-16 NOTE — Discharge Summary (Addendum)
 Name: Michelle [name] MRN: [id number] DOB: 1932-10-26 89 y.o. PCP: Ginette Otto, [facility]  Date of Admission: 09/14/2023  1:21 AM Date of Discharge: 09/16/23 Attending Physician: Dr. Cleda Daub  Discharge Diagnosis: Active Problems:   Hypertension   Acute encephalopathy   Mild protein-calorie malnutrition (HCC)   Spinal stenosis of lumbar region   Pressure injury of skin    Discharge Medications: Allergies as of 09/16/2023       Reactions   Codeine Sulfate Nausea Only   Haldol [haloperidol] Anxiety   Morphine Sulfate Nausea Only        Medication List     STOP taking these medications    acetaminophen 650 MG CR tablet Commonly known as: TYLENOL Replaced by: acetaminophen 500 MG tablet   fenofibrate micronized 134 MG capsule Commonly known as: LOFIBRA   ferrous sulfate 325 (65 FE) MG tablet   furosemide 20 MG tablet Commonly known as: LASIX   Lantus SoloStar 100 UNIT/ML Solostar Pen Generic drug: insulin glargine   metFORMIN 500 MG 24 hr tablet Commonly known as: GLUCOPHAGE-XR   rosuvastatin 40 MG tablet Commonly known as: CRESTOR       TAKE these medications    acetaminophen 500 MG tablet Commonly known as: TYLENOL Take 2 tablets (1,000 mg total) by mouth every 6 (six) hours as needed for mild pain (pain score 1-3). Replaces: acetaminophen 650 MG CR tablet   albuterol (2.5 MG/3ML) 0.083% nebulizer solution Commonly known as: PROVENTIL Take 2.5 mg by nebulization every 6 (six) hours as needed for wheezing or shortness of breath.   amLODipine 2.5 MG tablet Commonly known as: NORVASC Take 2.5 mg by mouth daily. May take an additional 2.5 mg tablet of BP is high What changed: Another medication with the same name was removed. Continue taking this medication, and follow the directions you see here.   aspirin 81 MG chewable tablet Chew 81 mg by mouth daily.   carvedilol 12.5 MG tablet Commonly known as: COREG Take 1 tablet (12.5 mg total) by mouth  2 (two) times daily.   cholecalciferol 25 MCG (1000 UNIT) tablet Commonly known as: VITAMIN D3 Take 1,000 Units by mouth daily with breakfast.   diphenoxylate-atropine 2.5-0.025 MG tablet Commonly known as: LOMOTIL Take 1 tablet by mouth 4 (four) times daily as needed for diarrhea or loose stools.   DropSafe Alcohol Prep 70 % Pads USE THREE TIMES DAILY   enalapril 20 MG tablet Commonly known as: VASOTEC Take 1 tablet (20 mg total) by mouth at bedtime.   fluticasone-salmeterol 250-50 MCG/ACT Aepb Commonly known as: ADVAIR INHALE 1 PUFF TWICE DAILY   icosapent Ethyl 1 g capsule Commonly known as: Vascepa Take 2 capsules (2 g total) by mouth 2 (two) times daily.   lamoTRIgine 25 MG tablet Commonly known as: LAMICTAL Please take 75 mg (3 tablets) in the morning and 100 mg (4 tablets) at night What changed:  how much to take how to take this when to take this additional instructions   loratadine 10 MG tablet Commonly known as: CLARITIN Take 10 mg by mouth every other day.   medroxyPROGESTERone 10 MG tablet Commonly known as: Provera Take 1 tablet (10 mg total) by mouth daily. What changed: when to take this   nortriptyline 10 MG capsule Commonly known as: PAMELOR TAKE 1 CAPSULE BY MOUTH AT BEDTIME AS NEEDED FOR PAIN PREVENTION What changed: See the new instructions.   oxyCODONE 5 MG immediate release tablet Commonly known as: Oxy IR/ROXICODONE Take 1  tablet (5 mg total) by mouth every 6 (six) hours as needed for severe pain (pain score 7-10).   pantoprazole 40 MG tablet Commonly known as: PROTONIX TAKE 1 TABLET EVERY DAY What changed: Another medication with the same name was removed. Continue taking this medication, and follow the directions you see here.   QUEtiapine 25 MG tablet Commonly known as: SEROQUEL Take 0.5 tablets (12.5 mg total) by mouth every evening. Between 1830 and 1900 What changed: how much to take   sertraline 50 MG tablet Commonly known  as: ZOLOFT TAKE 2 TABLETS AT BEDTIME   Spiriva HandiHaler 18 MCG inhalation capsule Generic drug: tiotropium PLACE 1 CAPSULE INTO HANDIHALER AND INHALE THE CONTENTS DAILY   TRUEplus Lancets 33G Misc TEST BLOOD SUGAR UP TO FOUR TIMES DAILY AS DIRECTED   vitamin E 180 MG (400 UNITS) capsule Take 400 Units by mouth daily.               Discharge Care Instructions  (From admission, onward)           Start     Ordered   09/16/23 0000  No dressing needed        09/16/23 1050            Disposition and follow-up:   Michelle [name] was discharged from [facility] in Stable condition.  At the hospital follow up visit please address:  1.  Follow-up: a.  Breakthrough seizure-Ensure patient is taking Lamictal 75 mg in the morning and 100 mg in the evening   b.  Severe lumbar stenosis-new back pain prompting MRI in ED.  Severe lumbar stenosis noted.  Neurosurgery consulted and did not think she was a surgical candidate.  Exam was not consistent with cauda equina syndrome.  c.  New urinary retention-attempted Foley trial but unsuccessful.  Foley was replaced.  May need follow-up with urology depending on family's wishes.  2.  Labs / imaging needed at time of follow-up: none  3.  Pending labs/ test needing follow-up: None  4.  Medication Changes Lamictal 75 mg in the morning and 100 mg at night Oxycodone 5 mg as needed, 20 tablets sent  Hospital Course by problem list: Break-though Seizures Presented with seizures refractory to intranasal benzos at home.  CT head showed small contusion of the left posterior parietal lobe near the vertex.  Spot EEG showed no active seizures.  Neurology was consulted and recommended increasing Lamictal dosing to 75 mg in the morning and 100 mg in the evening since she has continued to have breakthrough seizures.  Severe stenosis of L2-L5 She endorsed back pain following seizures.  MRI showed severe stenosis of L2-L5.   Neurosurgery was consulted and did not think that exam was consistent with cauda equina syndrome.  They also thought that with her current quality of life that any surgical interventions the risk would outweigh the benefits.  Was discharged with oxycodone and will continue to follow with hospice at home.  Urinary retention MRI completed in setting of back pain.  MRI noted marked bladder distention and Foley catheter was placed.  She did not pass a voiding trial and Foley was replaced prior to discharge.  Unclear if urinary retention is from neurologic findings with severe stenosis on MRI versus medications.  Initial concern was encephalopathy from seizures and benzos were contributing but this did not improve after she ripped turned to baseline.  Goals of care Currently receives home health hospice.  Will be discharged  back home.   Discharge Subjective: Patient evaluated at bedside this AM.  She was initially asleep but easily wakes up and talks with me.  She denies current back pain.  Discharge Exam:   BP (!) 159/67 (BP Location: Left Arm)   Pulse 72   Temp 98.4 F (36.9 C) (Oral)   Resp 16   Ht 5\' 5"  (1.651 m)   Wt 75.8 kg   SpO2 96%   BMI 27.81 kg/m  Constitutional: Chronically ill-appearing Cardiovascular: regular rate and rhythm, no m/r/g Pulmonary/Chest: normal work of breathing on room air, lungs clear to auscultation bilaterally Abdominal: soft, non-tender, non-distended MSK: normal bulk and tone Neurological: alert & oriented to self and place, does not know year Skin: warm and dry  Pertinent Labs, Studies, and Procedures:     Latest Ref Rng & Units 09/15/2023    7:18 AM 09/14/2023    1:43 AM 08/20/2023    2:08 AM  CBC  WBC 4.0 - 10.5 K/uL 7.1  5.6    Hemoglobin 12.0 - 15.0 g/dL 40.9  81.1  91.4   Hematocrit 36.0 - 46.0 % 40.6  45.1  39.0   Platelets 150 - 400 K/uL 212  219         Latest Ref Rng & Units 09/16/2023    6:49 AM 09/15/2023    7:18 AM 09/14/2023    1:43 AM   CMP  Glucose 70 - 99 mg/dL 782  956  213   BUN 8 - 23 mg/dL 8  10  16    Creatinine 0.44 - 1.00 mg/dL 0.86  5.78  4.69   Sodium 135 - 145 mmol/L 138  140  139   Potassium 3.5 - 5.1 mmol/L 3.4  3.2  3.5   Chloride 98 - 111 mmol/L 107  110  108   CO2 22 - 32 mmol/L 22  20  16    Calcium 8.9 - 10.3 mg/dL 9.4  9.5  9.7   Total Protein 6.5 - 8.1 g/dL  6.7  6.7   Total Bilirubin 0.0 - 1.2 mg/dL  0.6  0.6   Alkaline Phos 38 - 126 U/L  59  58   AST 15 - 41 U/L  23  27   ALT 0 - 44 U/L  22  23     EEG adult Result Date: 09/14/2023 Charlsie Quest, MD     09/14/2023  3:17 PM Patient Name: Michelle [name] MRN: [id number] Epilepsy Attending: Charlsie Quest Referring Physician/Provider: Marjorie Smolder, NP Date: 09/14/2023 Duration: 23.33 mins Patient history: 89yo female presenting from NH, on hospice, bedbound at baseline after ~3min seizure. EEG to evaluate for seizure Level of alertness: Awake, asleep AEDs during EEG study: LTG, versed Technical aspects: This EEG study was done with scalp electrodes positioned according to the 10-20 International system of electrode placement. Electrical activity was reviewed with band pass filter of 1-70Hz , sensitivity of 7 uV/mm, display speed of 77mm/sec with a 60Hz  notched filter applied as appropriate. EEG data were recorded continuously and digitally stored.  Video monitoring was available and reviewed as appropriate. Description: The posterior dominant rhythm consists of 8 Hz activity of moderate voltage (25-35 uV) seen predominantly in posterior head regions, symmetric and reactive to eye opening and eye closing. Sleep was characterized by vertex waves, sleep spindles (12 to 14 Hz), maximal frontocentral region. EEG showed intermittent generalized 3 to 6 Hz theta-delta slowing. Spike was noted in right hemisphere, maximal right temporo-parietal region.  Hyperventilation and photic stimulation were not performed.   ABNORMALITY -Spike, right hemisphere, maximal  right temporo-parietal region - Intermittent slow, generalized IMPRESSION: This study showed evidence of epileptogenicity arising from right hemisphere, maximal right temporo-parietal region. Additionally there is mild diffuse encephalopathy. No seizures were seen throughout the recording. Charlsie Quest   MR LUMBAR SPINE WO CONTRAST Result Date: 09/14/2023 CLINICAL DATA:  Low back pain, cauda equina syndrome suspected EXAM: MRI LUMBAR SPINE WITHOUT CONTRAST TECHNIQUE: Multiplanar, multisequence MR imaging of the lumbar spine was performed. No intravenous contrast was administered. COMPARISON:  Same day lumbar spine CT FINDINGS: Segmentation: As reported previously there are small rib lets at T12. Alignment:  Grade 1 anterolisthesis L4 on L5 and L5 on S1 Vertebrae:  No fracture, evidence of discitis, or bone lesion. Conus medullaris and cauda equina: Conus extends to the L1-L2 disc space level. Conus and cauda equina appear normal. Paraspinal and other soft tissues: Markedly distended urinary bladder. Disc levels: Limitations: Motion degraded exam T12-L1: Mild bilateral facet degenerative change. No significant disc bulge. No spinal canal narrowing. Mild right neural foraminal narrowing. L1-L2: Mild bilateral facet degenerative change with fluid in the facet joints. Minimal disc bulge. Mild spinal canal narrowing. Moderate bilateral neural foraminal narrowing. L2-L3: Moderate left and mild right facet degenerative change. There is a obliteration of the right lateral recess. Moderate severe spinal canal stenosis. Moderate left and moderate to severe right neural foraminal narrowing. L3-L4: Moderate bilateral facet degenerative change. Circumferential disc bulge. Moderate spinal canal narrowing. There is narrowing of the bilateral lateral recesses. Moderate bilateral neural foraminal narrowing. L4-L5: Severe left and moderate right facet degenerative change. Circumferential disc bulge. Severe spinal canal  stenosis. Severe bilateral neural foraminal stenosis. L5-S1: Moderate bilateral facet degenerative change. Eccentric right disc bulge. Mild overall spinal canal narrowing. Mild bilateral neural foraminal narrowing. IMPRESSION: 1. Motion degraded exam. Within this limitation, there is severe spinal canal stenosis at L4-L5 and moderate to severe spinal canal stenosis at L2-L3. This is secondary to a combination of degenerative disc bulges and ligamentum flavum hypertrophy. 2. Severe bilateral neural foraminal stenosis at L4-L5. 3. Markedly distended urinary bladder. Electronically Signed   By: Lorenza Cambridge M.D.   On: 09/14/2023 11:12   CT Lumbar Spine Wo Contrast Result Date: 09/14/2023 CLINICAL DATA:  Low back pain, increased fracture risk EXAM: CT LUMBAR SPINE WITHOUT CONTRAST TECHNIQUE: Multidetector CT imaging of the lumbar spine was performed without intravenous contrast administration. Multiplanar CT image reconstructions were also generated. RADIATION DOSE REDUCTION: This exam was performed according to the departmental dose-optimization program which includes automated exposure control, adjustment of the mA and/or kV according to patient size and/or use of iterative reconstruction technique. COMPARISON:  None Available. FINDINGS: Segmentation: 5 lumbar type vertebrae. There are small rib lets at T12. Alignment: Mild retrolisthesis of L2 on L3. Grade 1 anterolisthesis of L3 on L4 and L4 on L5. Vertebrae: No acute fracture or focal pathologic process. Degenerative endplate changes of the inferior and superior endplates of L4 on L5 and L2 on L3. There is a small Schmorl's node at L2. Vacuum disc phenomenon at the L2-L3, L3-L4, and L4-L5 disc spaces. Paraspinal and other soft tissues: Aortic atherosclerotic calcifications. Marked distention of the urinary bladder. Disc levels: Spinal canal stenosis: Severe spinal canal stenosis at L2-L3, L3-L4, L4-L5 secondary to a combination of the disc bulge and ligamentum  flavum hypertrophy Neuroforaminal stenosis: Severe bilateral neural foraminal stenosis at L4-L5 and moderate right-sided neural foraminal stenosis at L2-L3 and L3-L4. IMPRESSION: 1.  No acute fracture or traumatic listhesis. 2. Severe spinal canal stenosis at L2-L3, L3-L4, and L4-L5 secondary to a combination of the disc bulge and ligamentum flavum hypertrophy. Correlate for symptoms of cauda equina syndrome and consider further evaluation with lumbar spine MRI. 3. Severe bilateral neural foraminal stenosis at L4-L5 and moderate right-sided neural foraminal stenosis at L2-L3 and L3-L4. 4. Markedly distended urinary bladder. Aortic Atherosclerosis (ICD10-I70.0). Electronically Signed   By: Lorenza Cambridge M.D.   On: 09/14/2023 09:09   CT HEAD WO CONTRAST ( ) Result Date: 09/14/2023 CLINICAL DATA:  Recent seizure active EXAM: CT HEAD WITHOUT CONTRAST TECHNIQUE: Contiguous axial images were obtained from the base of the skull through the vertex without intravenous contrast. RADIATION DOSE REDUCTION: This exam was performed according to the departmental dose-optimization program which includes automated exposure control, adjustment of the mA and/or kV according to patient size and/or use of iterative reconstruction technique. COMPARISON:  08/20/2023 FINDINGS: Brain: Diffuse atrophic changes are identified. Minimal contusion is noted high in the left parietal lobe near the vertex. This measures approximately 5 mm in dimension. No acute infarct or space-occupying mass lesion is seen. Matter ischemic changes are noted. Vascular: No hyperdense vessel or unexpected calcification. Skull: Normal. Negative for fracture or focal lesion. Sinuses/Orbits: No acute finding. Other: Traumatic Brain Injury Risk Stratification Skull Fracture: No - Low/mBIG 1 Subdural Hematoma (SDH): No - Low Subarachnoid Hemorrhage [facility]): No Epidural Hematoma (EDH): No - Low/mBIG 1 Cerebral contusion, intra-axial, intraparenchymal Hemorrhage (IPH):  solitary, 4 to <30mm - Moderate/mBIG 2 Intraventricular Hemorrhage (IVH): No - Low/mBIG 1 Midline Shift > 1mm or Edema/effacement of sulci/vents: No - Low/mBIG 1 ---------------------------------------------------- IMPRESSION: Small contusion in the left posterior parietal lobe near the vertex. Chronic atrophic and ischemic changes are seen. Electronically Signed   By: Alcide Clever M.D.   On: 09/14/2023 02:15   DG Chest Port 1 View Result Date: 09/14/2023 CLINICAL DATA:  Seizure EXAM: PORTABLE CHEST 1 VIEW COMPARISON:  08/20/2023 FINDINGS: Heart and mediastinal contours are within normal limits. No focal opacities or effusions. No acute bony abnormality. Aortic atherosclerosis. IMPRESSION: No active disease. Electronically Signed   By: Charlett Nose M.D.   On: 09/14/2023 02:00     Discharge Instructions: Discharge Instructions     Diet - low sodium heart healthy   Complete by: As directed    Discharge instructions   Complete by: As directed    Michelle [name], Michelle [name] were admitted to the hospital as you had a breakthrough seizure.  Neurology recommended increasing your Lamictal to 75 mg in the morning and 100 mg at night.  With your new back pain imaging was completed that showed some disc herniation that may be pushing on your nerves.  Neurosurgery did not think that this was happening but they also thought that trying to do surgery for you would be more risky than beneficial.  You are being discharged with a Foley catheter in place as you have not been able to pee here.  Please follow-up with hospice for support with the Foley catheter.  I am not sure if this will have to be long-term, would recommend discontinuing medications that could cause urinary retention and you could consider trying another trial without the catheter if interested.  I am glad that you are feeling better.  -Dr. Sloan Leiter   Increase activity slowly   Complete by: As directed    No dressing needed   Complete by: As directed         Signed: Belanna Manring,  Kelton Bultman, DO 09/16/2023, 11:00 AM   Pager: [phone]

## 2023-09-16 NOTE — Progress Notes (Signed)
 No urinary output today.  Bladder scan showed >999 urine in bladder. Order for Foley catheter, for urinary retention placed by MD. Procedure explained to pt, and pt agreeable. Pericare completed, gown and bed pad changed. Foley placed by Christean Grief, RN assisted by Louie Boston, RN. Sterile technique maintained throughout procedure and 450 cc of amber clear urine collected. Pt tolerated well, no difficulties. Pt resting comfortably in bed.

## 2023-09-18 ENCOUNTER — Ambulatory Visit: Payer: Medicare HMO | Admitting: Family Medicine

## 2023-09-20 ENCOUNTER — Other Ambulatory Visit: Payer: Self-pay | Admitting: Family Medicine

## 2023-09-21 ENCOUNTER — Other Ambulatory Visit: Payer: Self-pay | Admitting: Family Medicine

## 2023-09-27 NOTE — Progress Notes (Signed)
 Coding clarification: Acute Encephalopathy due to postictal state.  Gust Rung, DO 09/27/23 3:54 PM

## 2023-09-27 NOTE — Progress Notes (Signed)
 Coding clarification: Pressure ulcer, stage 1 of the sacrum and pressure ulcer, stage 1 of the left heel, both POA  Gust Rung, DO 09/27/23 3:55 PM

## 2023-10-04 ENCOUNTER — Encounter (INDEPENDENT_AMBULATORY_CARE_PROVIDER_SITE_OTHER): Payer: Medicare HMO | Admitting: Ophthalmology

## 2023-10-04 DIAGNOSIS — Z961 Presence of intraocular lens: Secondary | ICD-10-CM

## 2023-10-04 DIAGNOSIS — H26493 Other secondary cataract, bilateral: Secondary | ICD-10-CM

## 2023-10-04 DIAGNOSIS — I10 Essential (primary) hypertension: Secondary | ICD-10-CM

## 2023-10-04 DIAGNOSIS — E119 Type 2 diabetes mellitus without complications: Secondary | ICD-10-CM

## 2023-10-04 DIAGNOSIS — H35033 Hypertensive retinopathy, bilateral: Secondary | ICD-10-CM

## 2023-10-12 ENCOUNTER — Encounter (HOSPITAL_COMMUNITY): Payer: Self-pay

## 2023-10-12 ENCOUNTER — Emergency Department (HOSPITAL_COMMUNITY)
Admission: EM | Admit: 2023-10-12 | Discharge: 2023-10-12 | Disposition: A | Discharge status: Home / Self Care | Attending: Emergency Medicine | Admitting: Emergency Medicine

## 2023-10-12 ENCOUNTER — Other Ambulatory Visit: Payer: Self-pay | Admitting: Family Medicine

## 2023-10-12 ENCOUNTER — Other Ambulatory Visit: Payer: Self-pay

## 2023-10-12 DIAGNOSIS — R404 Transient alteration of awareness: Secondary | ICD-10-CM | POA: Diagnosis not present

## 2023-10-12 DIAGNOSIS — G40909 Epilepsy, unspecified, not intractable, without status epilepticus: Secondary | ICD-10-CM | POA: Diagnosis not present

## 2023-10-12 DIAGNOSIS — N390 Urinary tract infection, site not specified: Secondary | ICD-10-CM | POA: Insufficient documentation

## 2023-10-12 DIAGNOSIS — Z7984 Long term (current) use of oral hypoglycemic drugs: Secondary | ICD-10-CM | POA: Diagnosis not present

## 2023-10-12 DIAGNOSIS — Z7982 Long term (current) use of aspirin: Secondary | ICD-10-CM | POA: Diagnosis not present

## 2023-10-12 DIAGNOSIS — Z7401 Bed confinement status: Secondary | ICD-10-CM | POA: Diagnosis not present

## 2023-10-12 DIAGNOSIS — Z79899 Other long term (current) drug therapy: Secondary | ICD-10-CM | POA: Diagnosis not present

## 2023-10-12 DIAGNOSIS — E876 Hypokalemia: Secondary | ICD-10-CM | POA: Insufficient documentation

## 2023-10-12 DIAGNOSIS — F0390 Unspecified dementia without behavioral disturbance: Secondary | ICD-10-CM | POA: Diagnosis not present

## 2023-10-12 DIAGNOSIS — I10 Essential (primary) hypertension: Secondary | ICD-10-CM | POA: Diagnosis not present

## 2023-10-12 DIAGNOSIS — R569 Unspecified convulsions: Secondary | ICD-10-CM

## 2023-10-12 LAB — COMPREHENSIVE METABOLIC PANEL WITH GFR
ALT: 15 U/L (ref 0–44)
AST: 14 U/L — ABNORMAL LOW (ref 15–41)
Albumin: 2.3 g/dL — ABNORMAL LOW (ref 3.5–5.0)
Alkaline Phosphatase: 43 U/L (ref 38–126)
Anion gap: 8 (ref 5–15)
BUN: 11 mg/dL (ref 8–23)
CO2: 18 mmol/L — ABNORMAL LOW (ref 22–32)
Calcium: 7.5 mg/dL — ABNORMAL LOW (ref 8.9–10.3)
Chloride: 116 mmol/L — ABNORMAL HIGH (ref 98–111)
Creatinine, Ser: 0.54 mg/dL (ref 0.44–1.00)
GFR, Estimated: >60 mL/min (ref >60)
Glucose, Bld: 90 mg/dL (ref 70–99)
Potassium: 2.8 mmol/L — ABNORMAL LOW (ref 3.5–5.1)
Sodium: 142 mmol/L (ref 135–145)
Total Bilirubin: 0.3 mg/dL (ref 0.0–1.2)
Total Protein: 5.2 g/dL — ABNORMAL LOW (ref 6.5–8.1)

## 2023-10-12 LAB — URINALYSIS, ROUTINE W REFLEX MICROSCOPIC
Bilirubin Urine: NEGATIVE
Glucose, UA: NEGATIVE mg/dL
Ketones, ur: NEGATIVE mg/dL
Nitrite: NEGATIVE
Protein, ur: NEGATIVE mg/dL
Specific Gravity, Urine: 1.008 (ref 1.005–1.030)
WBC, UA: >50 WBC/hpf (ref 0–5)
pH: 5 (ref 5.0–8.0)

## 2023-10-12 LAB — CBC WITH DIFFERENTIAL/PLATELET
Abs Immature Granulocytes: 0.02 10*3/uL (ref 0.00–0.07)
Basophils Absolute: 0 10*3/uL (ref 0.0–0.1)
Basophils Relative: 1 %
Eosinophils Absolute: 0.1 10*3/uL (ref 0.0–0.5)
Eosinophils Relative: 2 %
HCT: 33.2 % — ABNORMAL LOW (ref 36.0–46.0)
Hemoglobin: 10.8 g/dL — ABNORMAL LOW (ref 12.0–15.0)
Immature Granulocytes: 0 %
Lymphocytes Relative: 27 %
Lymphs Abs: 1.3 10*3/uL (ref 0.7–4.0)
MCH: 28.1 pg (ref 26.0–34.0)
MCHC: 32.5 g/dL (ref 30.0–36.0)
MCV: 86.2 fL (ref 80.0–100.0)
Monocytes Absolute: 0.5 10*3/uL (ref 0.1–1.0)
Monocytes Relative: 10 %
Neutro Abs: 2.9 10*3/uL (ref 1.7–7.7)
Neutrophils Relative %: 60 %
Platelets: 180 10*3/uL (ref 150–400)
RBC: 3.85 MIL/uL — ABNORMAL LOW (ref 3.87–5.11)
RDW: 14.8 % (ref 11.5–15.5)
WBC: 4.7 10*3/uL (ref 4.0–10.5)
nRBC: 0 % (ref 0.0–0.2)

## 2023-10-12 MED ORDER — SODIUM CHLORIDE 0.9 % IV SOLN
1.0000 g | Freq: Once | INTRAVENOUS | Status: AC
  Administered 2023-10-12: 1 g via INTRAVENOUS
  Filled 2023-10-12: qty 10

## 2023-10-12 MED ORDER — POTASSIUM CHLORIDE IN NACL 20-0.9 MEQ/L-% IV SOLN
Freq: Once | INTRAVENOUS | Status: AC
  Filled 2023-10-12: qty 1000

## 2023-10-12 NOTE — Progress Notes (Signed)
 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  Please call with any questions or concerns.  Hospital Liaison Team will follow for disposition.  Thank you, Haynes Bast, BSN, [facility] [phone]

## 2023-10-12 NOTE — ED Provider Notes (Signed)
 [place] EMERGENCY DEPARTMENT AT [facility] Provider Note   CSN: [id number] Arrival date & time: 10/12/23  0844     History  Chief Complaint  Patient presents with   Seizures    Michelle [name] is a 89 y.o. female.  HPI Elderly female arrives via EMS after a seizure witnessed at home.  EMS reports the patient's husband witnessed the patient having a seizure.  Patient received intranasal benzodiazepine with cessation of seizure activity, reportedly. Patient has a history of dementia and seizures, per EMS.  EMS does not report history of fall.  Patient began to move spontaneously while in transport.  Per EMS patient is bedbound, as above has dementia, and has had worsening day night orientation and confusion recently.  Level 5 caveat    Home Medications Prior to Admission medications   Medication Sig Start Date End Date Taking? Authorizing Provider  acetaminophen (TYLENOL) 500 MG tablet Take 2 tablets (1,000 mg total) by mouth every 6 (six) hours as needed for mild pain (pain score 1-3). 09/16/23   Masters, Katie, DO  albuterol (PROVENTIL) (2.5 MG/3ML) 0.083% nebulizer solution Take 2.5 mg by nebulization every 6 (six) hours as needed for wheezing or shortness of breath.    [provider]  Alcohol Swabs (DROPSAFE ALCOHOL PREP) 70 % PADS USE THREE TIMES DAILY 08/17/23   Shelva Majestic, MD  amLODipine (NORVASC) 2.5 MG tablet Take 2.5 mg by mouth daily. May take an additional 2.5 mg tablet of BP is high 08/17/23   [provider]  aspirin 81 MG chewable tablet Chew 81 mg by mouth daily.    [provider]  carvedilol (COREG) 12.5 MG tablet Take 1 tablet (12.5 mg total) by mouth 2 (two) times daily. 12/06/22   Alver Sorrow, NP  cholecalciferol (VITAMIN D3) 25 MCG (1000 UNIT) tablet Take 1,000 Units by mouth daily with breakfast.    [provider]  diphenoxylate-atropine (LOMOTIL) 2.5-0.025 MG tablet Take 1 tablet by mouth 4 (four) times  daily as needed for diarrhea or loose stools.    [provider]  enalapril (VASOTEC) 20 MG tablet Take 1 tablet (20 mg total) by mouth at bedtime. 03/23/23   Alver Sorrow, NP  fenofibrate micronized (LOFIBRA) 134 MG capsule TAKE 1 CAPSULE EVERY DAY 10/12/23   Shelva Majestic, MD  fluticasone-salmeterol (ADVAIR) 250-50 MCG/ACT AEPB INHALE 1 PUFF TWICE DAILY 07/20/23   Dulce Sellar, NP  icosapent Ethyl (VASCEPA) 1 g capsule Take 2 capsules (2 g total) by mouth 2 (two) times daily. 12/06/22   Alver Sorrow, NP  lamoTRIgine (LAMICTAL) 25 MG tablet Please take 75 mg (3 tablets) in the morning and 100 mg (4 tablets) at night 09/16/23   Masters, Katie, DO  LANTUS SOLOSTAR 100 UNIT/ML Solostar Pen INJECT 17 TO 20 UNITS UNDER THE SKIN EVERY MORNING 09/20/23   Shelva Majestic, MD  loratadine (CLARITIN) 10 MG tablet Take 10 mg by mouth every other day.    [provider]  medroxyPROGESTERone (PROVERA) 10 MG tablet Take 1 tablet (10 mg total) by mouth daily. Patient taking differently: Take 10 mg by mouth at bedtime. 09/12/22   Warner Mccreedy D, NP  metFORMIN (GLUCOPHAGE-XR) 500 MG 24 hr tablet TAKE 1 TABLET EVERY DAY WITH BREAKFAST 10/12/23   Shelva Majestic, MD  nortriptyline (PAMELOR) 10 MG capsule TAKE 1 CAPSULE BY MOUTH AT BEDTIME AS NEEDED FOR PAIN PREVENTION Patient taking differently: Take 10 mg by mouth at bedtime.  07/27/23   Rodolph Bong, MD  oxyCODONE (OXY IR/ROXICODONE) 5 MG immediate release tablet Take 1 tablet (5 mg total) by mouth every 6 (six) hours as needed for severe pain (pain score 7-10). 09/15/23   Masters, Florentina Addison, DO  pantoprazole (PROTONIX) 40 MG tablet TAKE 1 TABLET EVERY DAY 05/02/23   Shelva Majestic, MD  QUEtiapine (SEROQUEL) 25 MG tablet TAKE 1 TABLET EVERY EVENING BETWEEN 1830 AND 1900. 09/21/23   Shelva Majestic, MD  sertraline (ZOLOFT) 50 MG tablet TAKE 2 TABLETS AT BEDTIME 07/31/23   Shelva Majestic, MD  SPIRIVA HANDIHALER 18 MCG inhalation capsule  PLACE 1 CAPSULE INTO HANDIHALER AND INHALE THE CONTENTS DAILY 11/23/22   Shelva Majestic, MD  TRUEplus Lancets 33G MISC TEST BLOOD SUGAR UP TO FOUR TIMES DAILY AS DIRECTED 04/17/23   Shelva Majestic, MD  vitamin E 180 MG (400 UNITS) capsule Take 400 Units by mouth daily.    [provider]      Allergies    Codeine sulfate, Haldol [haloperidol], and Morphine sulfate    Review of Systems   Review of Systems  Physical Exam Updated Vital Signs BP (!) 159/74   Pulse 72   Temp 97.9 F (36.6 C) (Oral)   Resp 19   SpO2 100%  Physical Exam Vitals and nursing note reviewed.  Constitutional:      Appearance: She is well-developed. She is ill-appearing.     Comments: Chronically ill-appearing, but in no distress elderly female moving her extremity spontaneously and to minimal stimuli  HENT:     Head: Normocephalic and atraumatic.  Eyes:     Conjunctiva/sclera: Conjunctivae normal.  Cardiovascular:     Rate and Rhythm: Normal rate and regular rhythm.  Pulmonary:     Effort: Pulmonary effort is normal. No respiratory distress.     Breath sounds: Normal breath sounds. No stridor.  Abdominal:     General: There is no distension.  Skin:    General: Skin is warm and dry.  Neurological:     Comments: No facial asymmetry, patient does not speak, but does move to stimuli, including moving all 4 extremities.  She does not follow simple verbal commands, however.  Psychiatric:        Cognition and Memory: Cognition is impaired. Memory is impaired.     ED Results / Procedures / Treatments   Labs (all labs ordered are listed, but only abnormal results are displayed) Labs Reviewed  COMPREHENSIVE METABOLIC PANEL WITH GFR - Abnormal; Notable for the following components:      Result Value   Potassium 2.8 (*)    Chloride 116 (*)    CO2 18 (*)    Calcium 7.5 (*)    Total Protein 5.2 (*)    Albumin 2.3 (*)    AST 14 (*)    All other components within normal limits  CBC WITH  DIFFERENTIAL/PLATELET - Abnormal; Notable for the following components:   RBC 3.85 (*)    Hemoglobin 10.8 (*)    HCT 33.2 (*)    All other components within normal limits  URINALYSIS, ROUTINE W REFLEX MICROSCOPIC - Abnormal; Notable for the following components:   APPearance HAZY (*)    Hgb urine dipstick MODERATE (*)    Leukocytes,Ua LARGE (*)    Bacteria, UA MANY (*)    All other components within normal limits    EKG EKG Interpretation Date/Time:  Thursday October 12 2023 08:55:58 EDT Ventricular Rate:  86 PR Interval:  245 QRS Duration:  98 QT Interval:  381 QTC Calculation: 456 R Axis:   -42  Text Interpretation: Sinus rhythm Prolonged PR interval Left axis deviation Abnormal R-wave progression, early transition Borderline T wave abnormalities Confirmed by Gerhard Munch [phone]) on 10/12/2023 8:58:23 AM  Radiology No results found.  Procedures Procedures    Medications Ordered in ED Medications  0.9 % NaCl with KCl 20 mEq/ L  infusion (0 mL/hr Intravenous Paused 10/12/23 1451)  cefTRIAXone (ROCEPHIN) 1 g in sodium chloride 0.9 % 100 mL IVPB (1 g Intravenous New Bag/Given 10/12/23 1448)    ED Course/ Medical Decision Making/ A&P                                 Medical Decision Making Elderly female presents with concern for witnessed seizure.  Patient has advanced dementia, is bedbound, has no initial evidence for trauma.  Given concern for new seizure activity, differential including breakthrough seizure versus infection versus electrolyte abnormalities considered. Patient had labs performed, was monitored. Cardiac 85 sinus normal pulse ox 99% room air normal  Amount and/or Complexity of Data Reviewed Independent Historian: EMS External Data Reviewed: notes. Labs: ordered. Decision-making details documented in ED Course. ECG/medicine tests: ordered and independent interpretation performed. Decision-making details documented in ED Course.  Risk Prescription drug  management. Decision regarding hospitalization. Diagnosis or treatment significantly limited by social determinants of health.   3:08 PM Patient calm, no seizure activity here.  Patient has been accompanied bedside by 2 sons for several hours.  Discussed the patient's presentation, findings, enrollment in hospice.  Patient's initial labs notable for hypokalemia and she received fluid repletion.  Subsequent labs, urinalysis concerning for infection, and the patient had exchange of her Foley catheter, and antibiotics provided.  With these interventions, no other acute findings, stable vital signs, and known enrollment at hospice, the patient will return home following fluids, antibiotics, potassium.  Final Clinical Impression(s) / ED Diagnoses Final diagnoses:  Seizure (HCC)  Lower urinary tract infectious disease  Hypokalemia    Rx / DC Orders ED Discharge Orders     None         Gerhard Munch, MD 10/12/23 [phone]

## 2023-10-12 NOTE — ED Triage Notes (Signed)
 Pt here for seizure that lasted 3-5 min. Husband shot ativan up pts nose. Hx of dementia and seizures. Pt bed bound.

## 2023-10-12 NOTE — Discharge Instructions (Signed)
 Continue to take all previously prescribed medication as directed and be sure to discuss today's presentation with your hospice team.  Do not hesitate to return here for concerning changes in your condition.

## 2023-11-10 ENCOUNTER — Other Ambulatory Visit: Payer: Self-pay

## 2023-11-10 ENCOUNTER — Emergency Department (HOSPITAL_COMMUNITY)
Admission: EM | Admit: 2023-11-10 | Discharge: 2023-11-11 | Disposition: A | Discharge status: Home / Self Care | Attending: Emergency Medicine | Admitting: Emergency Medicine

## 2023-11-10 ENCOUNTER — Encounter (HOSPITAL_COMMUNITY): Payer: Self-pay

## 2023-11-10 DIAGNOSIS — E119 Type 2 diabetes mellitus without complications: Secondary | ICD-10-CM | POA: Insufficient documentation

## 2023-11-10 DIAGNOSIS — Z7984 Long term (current) use of oral hypoglycemic drugs: Secondary | ICD-10-CM | POA: Insufficient documentation

## 2023-11-10 DIAGNOSIS — Z79899 Other long term (current) drug therapy: Secondary | ICD-10-CM | POA: Diagnosis not present

## 2023-11-10 DIAGNOSIS — I10 Essential (primary) hypertension: Secondary | ICD-10-CM | POA: Diagnosis not present

## 2023-11-10 DIAGNOSIS — Z7982 Long term (current) use of aspirin: Secondary | ICD-10-CM | POA: Insufficient documentation

## 2023-11-10 DIAGNOSIS — R569 Unspecified convulsions: Secondary | ICD-10-CM | POA: Insufficient documentation

## 2023-11-10 DIAGNOSIS — R000 Tachycardia, unspecified: Secondary | ICD-10-CM | POA: Diagnosis not present

## 2023-11-10 DIAGNOSIS — F0390 Unspecified dementia without behavioral disturbance: Secondary | ICD-10-CM | POA: Diagnosis not present

## 2023-11-10 DIAGNOSIS — N3000 Acute cystitis without hematuria: Secondary | ICD-10-CM | POA: Insufficient documentation

## 2023-11-10 DIAGNOSIS — R404 Transient alteration of awareness: Secondary | ICD-10-CM | POA: Diagnosis not present

## 2023-11-10 DIAGNOSIS — R402421 Glasgow coma scale score 9-12, in the field [EMT or ambulance]: Secondary | ICD-10-CM | POA: Diagnosis not present

## 2023-11-10 LAB — CBC WITH DIFFERENTIAL/PLATELET
Abs Immature Granulocytes: 0.03 10*3/uL (ref 0.00–0.07)
Basophils Absolute: 0 10*3/uL (ref 0.0–0.1)
Basophils Relative: 1 %
Eosinophils Absolute: 0.1 10*3/uL (ref 0.0–0.5)
Eosinophils Relative: 2 %
HCT: 41.6 % (ref 36.0–46.0)
Hemoglobin: 14 g/dL (ref 12.0–15.0)
Immature Granulocytes: 0 %
Lymphocytes Relative: 23 %
Lymphs Abs: 1.8 10*3/uL (ref 0.7–4.0)
MCH: 28.2 pg (ref 26.0–34.0)
MCHC: 33.7 g/dL (ref 30.0–36.0)
MCV: 83.7 fL (ref 80.0–100.0)
Monocytes Absolute: 0.6 10*3/uL (ref 0.1–1.0)
Monocytes Relative: 8 %
Neutro Abs: 5.2 10*3/uL (ref 1.7–7.7)
Neutrophils Relative %: 66 %
Platelets: 230 10*3/uL (ref 150–400)
RBC: 4.97 MIL/uL (ref 3.87–5.11)
RDW: 15 % (ref 11.5–15.5)
WBC: 7.7 10*3/uL (ref 4.0–10.5)
nRBC: 0 % (ref 0.0–0.2)

## 2023-11-10 LAB — URINALYSIS, W/ REFLEX TO CULTURE (INFECTION SUSPECTED)
Bilirubin Urine: NEGATIVE
Glucose, UA: NEGATIVE mg/dL
Ketones, ur: NEGATIVE mg/dL
Nitrite: NEGATIVE
Protein, ur: >=300 mg/dL — AB
Specific Gravity, Urine: 1.022 (ref 1.005–1.030)
WBC, UA: >50 WBC/hpf (ref 0–5)
pH: 5 (ref 5.0–8.0)

## 2023-11-10 LAB — COMPREHENSIVE METABOLIC PANEL WITH GFR
ALT: 22 U/L (ref 0–44)
AST: 20 U/L (ref 15–41)
Albumin: 2.9 g/dL — ABNORMAL LOW (ref 3.5–5.0)
Alkaline Phosphatase: 89 U/L (ref 38–126)
Anion gap: 13 (ref 5–15)
BUN: 15 mg/dL (ref 8–23)
CO2: 18 mmol/L — ABNORMAL LOW (ref 22–32)
Calcium: 9.4 mg/dL (ref 8.9–10.3)
Chloride: 108 mmol/L (ref 98–111)
Creatinine, Ser: 0.52 mg/dL (ref 0.44–1.00)
GFR, Estimated: >60 mL/min (ref >60)
Glucose, Bld: 173 mg/dL — ABNORMAL HIGH (ref 70–99)
Potassium: 3.1 mmol/L — ABNORMAL LOW (ref 3.5–5.1)
Sodium: 139 mmol/L (ref 135–145)
Total Bilirubin: 0.4 mg/dL (ref 0.0–1.2)
Total Protein: 6.2 g/dL — ABNORMAL LOW (ref 6.5–8.1)

## 2023-11-10 MED ORDER — LAMOTRIGINE 5 MG PO CHEW
100.0000 mg | CHEWABLE_TABLET | Freq: Once | ORAL | Status: AC
Start: 2023-11-10 — End: 2023-11-10
  Administered 2023-11-10: 100 mg via ORAL
  Filled 2023-11-10: qty 20

## 2023-11-10 MED ORDER — CEPHALEXIN 500 MG PO CAPS: 500.0000 mg | ORAL_CAPSULE | Freq: Three times a day (TID) | ORAL | 0 refills | Status: DC

## 2023-11-10 MED ORDER — LAMOTRIGINE 25 MG PO TABS: ORAL_TABLET | ORAL | 0 refills | Status: DC

## 2023-11-10 MED ORDER — LAMOTRIGINE 25 MG PO TABS: 100.0000 mg | ORAL_TABLET | Freq: Once | ORAL | Status: DC

## 2023-11-10 MED ORDER — SODIUM CHLORIDE 0.9 % IV SOLN
1.0000 g | Freq: Once | INTRAVENOUS | Status: AC
  Administered 2023-11-10: 1 g via INTRAVENOUS
  Filled 2023-11-10: qty 10

## 2023-11-10 NOTE — ED Provider Notes (Signed)
 [place] EMERGENCY DEPARTMENT AT [facility] Provider Note   CSN: [id number] Arrival date & time: 11/10/23  1811     History  Chief Complaint  Patient presents with   Seizures    KRISTENA WILHELMI is a 89 y.o. female history of hypertension, diabetes, dementia, seizures here presenting with another seizure.  Patient was recently treated for UTI.  Patient has indwelling Foley.  Patient was noted to have an episode of tonic-clonic seizure activity.  Patient was given benzos by family and husband wanted patient to come here and be checked out.  Patient is on home hospice.  Patient's baseline mental status is poor.  The history is provided by the EMS personnel.       Home Medications Prior to Admission medications   Medication Sig Start Date End Date Taking? Authorizing Provider  acetaminophen  (TYLENOL ) 500 MG tablet Take 2 tablets (1,000 mg total) by mouth every 6 (six) hours as needed for mild pain (pain score 1-3). 09/16/23   Masters, Katie, DO  albuterol  (PROVENTIL ) (2.5 MG/3ML) 0.083% nebulizer solution Take 2.5 mg by nebulization every 6 (six) hours as needed for wheezing or shortness of breath.    [provider]  Alcohol Swabs (DROPSAFE ALCOHOL PREP) 70 % PADS USE THREE TIMES DAILY 08/17/23   Almira Jaeger, MD  amLODipine  (NORVASC ) 2.5 MG tablet Take 2.5 mg by mouth daily. May take an additional 2.5 mg tablet of BP is high 08/17/23   [provider]  aspirin  81 MG chewable tablet Chew 81 mg by mouth daily.    [provider]  carvedilol  (COREG ) 12.5 MG tablet Take 1 tablet (12.5 mg total) by mouth 2 (two) times daily. 12/06/22   Clearnce Curia, NP  cholecalciferol  (VITAMIN D3) 25 MCG (1000 UNIT) tablet Take 1,000 Units by mouth daily with breakfast.    [provider]  diphenoxylate-atropine  (LOMOTIL) 2.5-0.025 MG tablet Take 1 tablet by mouth 4 (four) times daily as needed for diarrhea or loose stools.    [provider]   enalapril  (VASOTEC ) 20 MG tablet Take 1 tablet (20 mg total) by mouth at bedtime. 03/23/23   Walker, Caitlin S, NP  fenofibrate  micronized (LOFIBRA) 134 MG capsule TAKE 1 CAPSULE EVERY DAY 10/12/23   Almira Jaeger, MD  fluticasone -salmeterol (ADVAIR) 250-50 MCG/ACT AEPB INHALE 1 PUFF TWICE DAILY 07/20/23   Versa Gore, NP  icosapent  Ethyl (VASCEPA ) 1 g capsule Take 2 capsules (2 g total) by mouth 2 (two) times daily. 12/06/22   Clearnce Curia, NP  lamoTRIgine  (LAMICTAL ) 25 MG tablet Please take 75 mg (3 tablets) in the morning and 100 mg (4 tablets) at night 09/16/23   Masters, Alston Jerry, DO  LANTUS  SOLOSTAR 100 UNIT/ML Solostar Pen INJECT 17 TO 20 UNITS UNDER THE SKIN EVERY MORNING 09/20/23   Almira Jaeger, MD  loratadine  (CLARITIN ) 10 MG tablet Take 10 mg by mouth every other day.    [provider]  medroxyPROGESTERone  (PROVERA ) 10 MG tablet Take 1 tablet (10 mg total) by mouth daily. Patient taking differently: Take 10 mg by mouth at bedtime. 09/12/22   Cross, Melissa D, NP  metFORMIN  (GLUCOPHAGE -XR) 500 MG 24 hr tablet TAKE 1 TABLET EVERY DAY WITH BREAKFAST 10/12/23   Almira Jaeger, MD  nortriptyline  (PAMELOR ) 10 MG capsule TAKE 1 CAPSULE BY MOUTH AT BEDTIME AS NEEDED FOR PAIN PREVENTION Patient taking differently: Take 10 mg by mouth at bedtime. 07/27/23   Corey, Evan S, MD  oxyCODONE  (  OXY IR/ROXICODONE ) 5 MG immediate release tablet Take 1 tablet (5 mg total) by mouth every 6 (six) hours as needed for severe pain (pain score 7-10). 09/15/23   Masters, Alston Jerry, DO  pantoprazole  (PROTONIX ) 40 MG tablet TAKE 1 TABLET EVERY DAY 05/02/23   Almira Jaeger, MD  QUEtiapine  (SEROQUEL ) 25 MG tablet TAKE 1 TABLET EVERY EVENING BETWEEN 1830 AND 1900. 09/21/23   Almira Jaeger, MD  sertraline  (ZOLOFT ) 50 MG tablet TAKE 2 TABLETS AT BEDTIME 07/31/23   Almira Jaeger, MD  SPIRIVA  HANDIHALER 18 MCG inhalation capsule PLACE 1 CAPSULE INTO HANDIHALER AND INHALE THE CONTENTS DAILY 11/23/22    Almira Jaeger, MD  TRUEplus Lancets 33G MISC TEST BLOOD SUGAR UP TO FOUR TIMES DAILY AS DIRECTED 04/17/23   Almira Jaeger, MD  vitamin E  180 MG (400 UNITS) capsule Take 400 Units by mouth daily.    [provider]      Allergies    Codeine sulfate, Haldol  [haloperidol ], and Morphine  sulfate    Review of Systems   Review of Systems  Neurological:  Positive for seizures.  All other systems reviewed and are negative.   Physical Exam Updated Vital Signs BP (!) 163/86   Pulse (!) 104   Temp 98.3 F (36.8 C) (Oral)   Resp 14   SpO2 100%  Physical Exam Vitals and nursing note reviewed.  Constitutional:      Comments: Altered and confused but no active seizure activity  HENT:     Head: Normocephalic.     Nose: Nose normal.     Mouth/Throat:     Mouth: Mucous membranes are moist.  Eyes:     Extraocular Movements: Extraocular movements intact.     Pupils: Pupils are equal, round, and reactive to light.  Cardiovascular:     Rate and Rhythm: Normal rate and regular rhythm.     Pulses: Normal pulses.     Heart sounds: Normal heart sounds.  Pulmonary:     Effort: Pulmonary effort is normal.     Breath sounds: Normal breath sounds.  Abdominal:     General: Abdomen is flat.     Palpations: Abdomen is soft.     Comments: Foley with a lot of sediment  Musculoskeletal:        General: Normal range of motion.     Cervical back: Normal range of motion and neck supple.  Skin:    General: Skin is warm.     Capillary Refill: Capillary refill takes less than 2 seconds.  Neurological:     Comments: Patient is confused which is baseline.  Nonverbal.  Patient is contracted which is also baseline.  Psychiatric:     Comments: Unable      ED Results / Procedures / Treatments   Labs (all labs ordered are listed, but only abnormal results are displayed) Labs Reviewed  CBC WITH DIFFERENTIAL/PLATELET  COMPREHENSIVE METABOLIC PANEL WITH GFR  URINALYSIS, W/ REFLEX TO  CULTURE (INFECTION SUSPECTED)  LAMOTRIGINE  LEVEL    EKG None  Radiology No results found.  Procedures Procedures    Medications Ordered in ED Medications  lamoTRIgine  (LAMICTAL ) tablet 100 mg (has no administration in time range)    ED Course/ Medical Decision Making/ A&P                                 Medical Decision Making SHAYANNE GOMM is a 89 y.o. female  here presenting with seizure and altered mental status.  Patient has a history of seizure and is on Lamictal .  Patient had a witnessed seizure and was given benzos and is back to baseline.  Will check electrolytes and urinalysis.  Patient is on home hospice.  Will give her nighttime dose of Lamictal  and observe.  10:19 PM Reviewed patient's labs and bicarb is 18.  Anion gap is normal.  The Foley was swapped out and UA showed large leuks and greater than 50 white blood cell count.  Patient was given Rocephin .  Will discharge back home with Keflex.  Will continue the same dose of the Lamictal .  I think that UTI triggered her seizure last time and this time.  Recommend that Foley gets changed monthly.   Problems Addressed: Acute cystitis without hematuria: acute illness or injury Seizure [facility]): acute illness or injury  Amount and/or Complexity of Data Reviewed Labs: ordered. Decision-making details documented in ED Course.  Risk Prescription drug management.    Final Clinical Impression(s) / ED Diagnoses Final diagnoses:  None    Rx / DC Orders ED Discharge Orders     None         Dalene Duck, MD 11/10/23 2221

## 2023-11-10 NOTE — Discharge Instructions (Signed)
 As we discussed, you likely have a seizure that is triggered by your urinary tract infection.  It is standard of care for Foley catheters to be changed every month so I recommend that her Foley catheter gets changed by the end of May.  I have prescribed Keflex 500 mg 3 times daily for a week for urinary tract infection  Please continue Lamictal  as prescribed.  I have prescribed the tablets that you can crush  Please follow-up with your primary care doctor and neurologist  Return to ER if she has another seizure, lethargy or fever or vomiting

## 2023-11-10 NOTE — ED Triage Notes (Signed)
 Pt presents to ED via EMS with report of seizure at home. Given seizure medication at home by husband. Hx of the same. Hospice pt.

## 2023-11-11 NOTE — ED Notes (Signed)
 Patient discharged home with PTAR at this time

## 2023-11-13 LAB — URINE CULTURE: Culture: 80000 — AB

## 2023-11-14 ENCOUNTER — Telehealth (HOSPITAL_BASED_OUTPATIENT_CLINIC_OR_DEPARTMENT_OTHER): Payer: Self-pay | Admitting: *Deleted

## 2023-11-14 NOTE — Telephone Encounter (Signed)
 Post ED Visit - Positive Culture Follow-up: Unsuccessful Patient Follow-up  Culture assessed and recommendations reviewed by:  [x]  Court Distance, Pharm.D. []  Skeet Duke, Pharm.D., BCPS AQ-ID []  Leslee Rase, Pharm.D., BCPS []  Garland Junk, Pharm.D., BCPS []  Rocky, [address].D., BCPS, AAHIVP []  Alcide Aly, Pharm.D., BCPS, AAHIVP []  Alejo Hurter, PharmD []  Thomasine Flick, PharmD, BCPS  Positive urine culture  []  Patient discharged without antimicrobial prescription and treatment is now indicated [x]  Organism is resistant to prescribed ED discharge antimicrobial []  Patient with positive blood cultures   Unable to Speak to patient. Patient is in Hospice care and near death per son. Son refused offer to call antibiotics in to pharmacy. Antibiotic change deferred and RN offered deepest condolence. Lovell Rubenstein Sharion Davidson 11/14/2023, 11:58 AM

## 2023-11-14 NOTE — Progress Notes (Signed)
 ED Antimicrobial Stewardship Positive Culture Follow Up   Michelle [name] is an 89 y.o. female who presented to [facility] on 11/10/2023 with a chief complaint of  Chief Complaint  Patient presents with   Seizures    Recent Results (from the past 720 hours)  Urine Culture     Status: Abnormal   Collection Time: 11/10/23  6:18 PM   Specimen: Urine, Random  Result Value Ref Range Status   Specimen Description URINE, RANDOM  Final   Special Requests   Final    NONE Reflexed from [id number] Performed at [facility], [address] [place], [place] [address]    Culture (A)  Final    80,000 COLONIES/mL KLEBSIELLA PNEUMONIAE 80,000 COLONIES/mL ENTEROCOCCUS FAECALIS    Report Status 11/13/2023 FINAL  Final   Organism ID, Bacteria KLEBSIELLA PNEUMONIAE (A)  Final   Organism ID, Bacteria ENTEROCOCCUS FAECALIS (A)  Final      Susceptibility   Enterococcus faecalis - MIC*    AMPICILLIN <=2 SENSITIVE Sensitive     NITROFURANTOIN  <=16 SENSITIVE Sensitive     VANCOMYCIN 1 SENSITIVE Sensitive     * 80,000 COLONIES/mL ENTEROCOCCUS FAECALIS   Klebsiella pneumoniae - MIC*    AMPICILLIN >=32 RESISTANT Resistant     CEFAZOLIN <=4 SENSITIVE Sensitive     CEFEPIME <=0.12 SENSITIVE Sensitive     CEFTRIAXONE  <=0.25 SENSITIVE Sensitive     CIPROFLOXACIN <=0.25 SENSITIVE Sensitive     GENTAMICIN <=1 SENSITIVE Sensitive     IMIPENEM <=0.25 SENSITIVE Sensitive     NITROFURANTOIN  64 INTERMEDIATE Intermediate     TRIMETH /SULFA  <=20 SENSITIVE Sensitive     AMPICILLIN/SULBACTAM 8 SENSITIVE Sensitive     PIP/TAZO <=4 SENSITIVE Sensitive ug/mL    * 80,000 COLONIES/mL KLEBSIELLA PNEUMONIAE    [x]  Treated with cephalexin, organism resistant to prescribed antimicrobial []  Patient discharged originally without antimicrobial agent and treatment is now indicated  New antibiotic prescription: Augmentin  875 mg PO every 12 hours x5 days  ED Provider: Lorin Roemhildt, PA-C  Miamor Ayler,  PharmD PGY-1 Acute Care Pharmacy Resident 11/14/2023 10:41 AM Monday - Friday phone -  [phone] Saturday - Sunday phone - [phone]

## 2023-11-20 ENCOUNTER — Telehealth: Payer: Self-pay | Admitting: *Deleted

## 2023-11-20 NOTE — Telephone Encounter (Signed)
 Copied from CRM [id number]. Topic: General - Deceased Patient >> 14-Dec-2023  9:54 AM Ovid Blow wrote: Name of caller: Kyla Fantauzzi ([name])  Date of death: 12/12/2023   Name of funeral home: [employer] number of funeral home: [phone]  Provider that needs to sign form: Mont Antis just wanted to let Dr. Arlene Ben know about patient  Timeline for signing: n/a

## 2023-11-20 NOTE — Telephone Encounter (Signed)
 Spoke with son- was having multiple seizures on day she passed and ativan  not helping as much- she then passed. I offered our condolences  Team please mark as deceased

## 2023-11-20 NOTE — Telephone Encounter (Signed)
Pt has been marked deceased

## 2023-12-17 DEATH — deceased
